# Patient Record
Sex: Female | Born: 1975 | Race: White | Hispanic: No | Marital: Married | Smoking: Former smoker
Health system: Southern US, Community
[De-identification: ages and names within clinical notes are randomized; demographics above are authoritative.]

## PROBLEM LIST (undated history)

## (undated) DIAGNOSIS — R569 Unspecified convulsions: Secondary | ICD-10-CM

## (undated) DIAGNOSIS — D069 Carcinoma in situ of cervix, unspecified: Secondary | ICD-10-CM

## (undated) DIAGNOSIS — N186 End stage renal disease: Secondary | ICD-10-CM

## (undated) DIAGNOSIS — S92353A Displaced fracture of fifth metatarsal bone, unspecified foot, initial encounter for closed fracture: Secondary | ICD-10-CM

## (undated) DIAGNOSIS — G629 Polyneuropathy, unspecified: Secondary | ICD-10-CM

## (undated) DIAGNOSIS — I509 Heart failure, unspecified: Secondary | ICD-10-CM

## (undated) DIAGNOSIS — I10 Essential (primary) hypertension: Secondary | ICD-10-CM

## (undated) DIAGNOSIS — D649 Anemia, unspecified: Secondary | ICD-10-CM

## (undated) DIAGNOSIS — E785 Hyperlipidemia, unspecified: Secondary | ICD-10-CM

## (undated) DIAGNOSIS — K3184 Gastroparesis: Secondary | ICD-10-CM

## (undated) DIAGNOSIS — R011 Cardiac murmur, unspecified: Secondary | ICD-10-CM

## (undated) DIAGNOSIS — N289 Disorder of kidney and ureter, unspecified: Secondary | ICD-10-CM

## (undated) DIAGNOSIS — E11610 Type 2 diabetes mellitus with diabetic neuropathic arthropathy: Secondary | ICD-10-CM

## (undated) DIAGNOSIS — I2510 Atherosclerotic heart disease of native coronary artery without angina pectoris: Secondary | ICD-10-CM

## (undated) DIAGNOSIS — Z992 Dependence on renal dialysis: Secondary | ICD-10-CM

## (undated) DIAGNOSIS — I739 Peripheral vascular disease, unspecified: Secondary | ICD-10-CM

## (undated) DIAGNOSIS — Z872 Personal history of diseases of the skin and subcutaneous tissue: Secondary | ICD-10-CM

## (undated) DIAGNOSIS — G43909 Migraine, unspecified, not intractable, without status migrainosus: Secondary | ICD-10-CM

## (undated) DIAGNOSIS — M1990 Unspecified osteoarthritis, unspecified site: Secondary | ICD-10-CM

## (undated) DIAGNOSIS — E119 Type 2 diabetes mellitus without complications: Secondary | ICD-10-CM

## (undated) DIAGNOSIS — F17200 Nicotine dependence, unspecified, uncomplicated: Secondary | ICD-10-CM

## (undated) DIAGNOSIS — S82853A Displaced trimalleolar fracture of unspecified lower leg, initial encounter for closed fracture: Secondary | ICD-10-CM

## (undated) HISTORY — DX: Hyperlipidemia, unspecified: E78.5

## (undated) HISTORY — DX: Nicotine dependence, unspecified, uncomplicated: F17.200

## (undated) HISTORY — PX: CERVICAL BIOPSY  W/ LOOP ELECTRODE EXCISION: SUR135

## (undated) HISTORY — DX: Essential (primary) hypertension: I10

## (undated) HISTORY — DX: Gastroparesis: K31.84

## (undated) HISTORY — DX: Type 2 diabetes mellitus with diabetic neuropathic arthropathy: E11.610

## (undated) HISTORY — PX: CERVICAL CONE BIOPSY: SUR198

## (undated) HISTORY — PX: UMBILICAL HERNIA REPAIR: SHX196

## (undated) HISTORY — PX: INCISION AND DRAINAGE ABSCESS: SHX5864

## (undated) HISTORY — PX: COLPOSCOPY: SHX161

## (undated) HISTORY — DX: Atherosclerotic heart disease of native coronary artery without angina pectoris: I25.10

## (undated) HISTORY — DX: Type 2 diabetes mellitus without complications: E11.9

## (undated) HISTORY — PX: CATARACT EXTRACTION W/ INTRAOCULAR LENS  IMPLANT, BILATERAL: SHX1307

## (undated) HISTORY — DX: Anemia, unspecified: D64.9

## (undated) HISTORY — DX: End stage renal disease: N18.6

## (undated) HISTORY — PX: HERNIA REPAIR: SHX51

## (undated) HISTORY — DX: Personal history of diseases of the skin and subcutaneous tissue: Z87.2

## (undated) HISTORY — DX: Peripheral vascular disease, unspecified: I73.9

## (undated) HISTORY — PX: FRACTURE SURGERY: SHX138

## (undated) HISTORY — DX: Carcinoma in situ of cervix, unspecified: D06.9

## (undated) HISTORY — DX: Dependence on renal dialysis: Z99.2

---

## 1999-07-04 ENCOUNTER — Other Ambulatory Visit: Admission: RE | Admit: 1999-07-04 | Discharge: 1999-07-04 | Payer: Self-pay | Admitting: Obstetrics and Gynecology

## 1999-07-04 ENCOUNTER — Encounter (INDEPENDENT_AMBULATORY_CARE_PROVIDER_SITE_OTHER): Payer: Self-pay

## 2000-02-21 ENCOUNTER — Encounter (INDEPENDENT_AMBULATORY_CARE_PROVIDER_SITE_OTHER): Payer: Self-pay | Admitting: Specialist

## 2000-02-21 ENCOUNTER — Other Ambulatory Visit: Admission: RE | Admit: 2000-02-21 | Discharge: 2000-02-21 | Payer: Self-pay | Admitting: Obstetrics and Gynecology

## 2000-04-22 ENCOUNTER — Encounter: Payer: Self-pay | Admitting: Obstetrics and Gynecology

## 2000-04-28 ENCOUNTER — Encounter (INDEPENDENT_AMBULATORY_CARE_PROVIDER_SITE_OTHER): Payer: Self-pay | Admitting: Specialist

## 2000-04-28 ENCOUNTER — Ambulatory Visit (HOSPITAL_COMMUNITY): Admission: RE | Admit: 2000-04-28 | Discharge: 2000-04-28 | Payer: Self-pay | Admitting: Obstetrics and Gynecology

## 2000-11-21 ENCOUNTER — Other Ambulatory Visit: Admission: RE | Admit: 2000-11-21 | Discharge: 2000-11-21 | Payer: Self-pay | Admitting: Obstetrics and Gynecology

## 2001-08-20 ENCOUNTER — Other Ambulatory Visit: Admission: RE | Admit: 2001-08-20 | Discharge: 2001-08-20 | Payer: Self-pay | Admitting: Obstetrics and Gynecology

## 2002-08-27 ENCOUNTER — Other Ambulatory Visit: Admission: RE | Admit: 2002-08-27 | Discharge: 2002-08-27 | Payer: Self-pay | Admitting: Obstetrics and Gynecology

## 2003-09-02 ENCOUNTER — Other Ambulatory Visit: Admission: RE | Admit: 2003-09-02 | Discharge: 2003-09-02 | Payer: Self-pay | Admitting: Obstetrics and Gynecology

## 2004-10-11 ENCOUNTER — Encounter: Admission: RE | Admit: 2004-10-11 | Discharge: 2004-10-11 | Payer: Self-pay | Admitting: Orthopedic Surgery

## 2005-10-25 ENCOUNTER — Other Ambulatory Visit: Admission: RE | Admit: 2005-10-25 | Discharge: 2005-10-25 | Payer: Self-pay | Admitting: Obstetrics and Gynecology

## 2006-10-30 ENCOUNTER — Other Ambulatory Visit: Admission: RE | Admit: 2006-10-30 | Discharge: 2006-10-30 | Payer: Self-pay | Admitting: Obstetrics and Gynecology

## 2007-05-28 HISTORY — PX: DILATION AND CURETTAGE OF UTERUS: SHX78

## 2007-07-20 ENCOUNTER — Other Ambulatory Visit: Admission: RE | Admit: 2007-07-20 | Discharge: 2007-07-20 | Payer: Self-pay | Admitting: Obstetrics and Gynecology

## 2007-08-27 ENCOUNTER — Encounter: Payer: Self-pay | Admitting: Obstetrics & Gynecology

## 2007-08-27 ENCOUNTER — Ambulatory Visit (HOSPITAL_COMMUNITY): Admission: RE | Admit: 2007-08-27 | Discharge: 2007-08-27 | Payer: Self-pay | Admitting: Obstetrics & Gynecology

## 2007-10-01 ENCOUNTER — Ambulatory Visit: Payer: Self-pay | Admitting: Cardiology

## 2009-09-20 ENCOUNTER — Encounter: Payer: Self-pay | Admitting: Cardiology

## 2009-10-11 ENCOUNTER — Encounter: Payer: Self-pay | Admitting: Cardiology

## 2009-10-12 ENCOUNTER — Ambulatory Visit: Payer: Self-pay | Admitting: Cardiology

## 2009-10-12 ENCOUNTER — Encounter: Payer: Self-pay | Admitting: Cardiology

## 2009-10-15 ENCOUNTER — Encounter: Payer: Self-pay | Admitting: Cardiology

## 2009-10-24 ENCOUNTER — Encounter: Payer: Self-pay | Admitting: Cardiology

## 2009-10-25 ENCOUNTER — Ambulatory Visit: Payer: Self-pay | Admitting: Cardiology

## 2009-10-25 ENCOUNTER — Encounter: Payer: Self-pay | Admitting: Physician Assistant

## 2009-10-26 ENCOUNTER — Encounter: Payer: Self-pay | Admitting: Cardiology

## 2009-12-20 ENCOUNTER — Ambulatory Visit: Payer: Self-pay | Admitting: Cardiology

## 2009-12-20 DIAGNOSIS — R0602 Shortness of breath: Secondary | ICD-10-CM

## 2009-12-20 DIAGNOSIS — N049 Nephrotic syndrome with unspecified morphologic changes: Secondary | ICD-10-CM | POA: Insufficient documentation

## 2009-12-20 DIAGNOSIS — I2510 Atherosclerotic heart disease of native coronary artery without angina pectoris: Secondary | ICD-10-CM | POA: Insufficient documentation

## 2009-12-20 DIAGNOSIS — D509 Iron deficiency anemia, unspecified: Secondary | ICD-10-CM

## 2009-12-20 DIAGNOSIS — E785 Hyperlipidemia, unspecified: Secondary | ICD-10-CM

## 2009-12-20 DIAGNOSIS — R079 Chest pain, unspecified: Secondary | ICD-10-CM

## 2009-12-20 DIAGNOSIS — E7800 Pure hypercholesterolemia, unspecified: Secondary | ICD-10-CM

## 2009-12-20 DIAGNOSIS — I10 Essential (primary) hypertension: Secondary | ICD-10-CM | POA: Insufficient documentation

## 2009-12-20 DIAGNOSIS — R072 Precordial pain: Secondary | ICD-10-CM

## 2009-12-20 DIAGNOSIS — F17200 Nicotine dependence, unspecified, uncomplicated: Secondary | ICD-10-CM

## 2009-12-20 DIAGNOSIS — D649 Anemia, unspecified: Secondary | ICD-10-CM | POA: Insufficient documentation

## 2009-12-25 ENCOUNTER — Encounter (INDEPENDENT_AMBULATORY_CARE_PROVIDER_SITE_OTHER): Payer: Self-pay | Admitting: *Deleted

## 2009-12-27 ENCOUNTER — Telehealth (INDEPENDENT_AMBULATORY_CARE_PROVIDER_SITE_OTHER): Payer: Self-pay | Admitting: *Deleted

## 2010-01-09 ENCOUNTER — Ambulatory Visit: Payer: Self-pay | Admitting: Vascular Surgery

## 2010-01-27 ENCOUNTER — Encounter: Payer: Self-pay | Admitting: Cardiology

## 2010-04-09 ENCOUNTER — Encounter: Payer: Self-pay | Admitting: Cardiology

## 2010-04-16 ENCOUNTER — Encounter: Payer: Self-pay | Admitting: Cardiology

## 2010-04-23 ENCOUNTER — Encounter: Payer: Self-pay | Admitting: Cardiology

## 2010-04-26 HISTORY — PX: AV FISTULA PLACEMENT: SHX1204

## 2010-04-30 ENCOUNTER — Encounter: Payer: Self-pay | Admitting: Cardiology

## 2010-05-03 ENCOUNTER — Ambulatory Visit: Payer: Self-pay | Admitting: Physician Assistant

## 2010-05-03 DIAGNOSIS — E109 Type 1 diabetes mellitus without complications: Secondary | ICD-10-CM | POA: Insufficient documentation

## 2010-06-09 ENCOUNTER — Encounter: Payer: Self-pay | Admitting: Cardiology

## 2010-06-11 ENCOUNTER — Encounter: Payer: Self-pay | Admitting: Cardiology

## 2010-06-18 ENCOUNTER — Encounter: Payer: Self-pay | Admitting: Cardiology

## 2010-06-19 ENCOUNTER — Encounter: Payer: Self-pay | Admitting: Cardiology

## 2010-06-25 ENCOUNTER — Encounter: Payer: Self-pay | Admitting: Cardiology

## 2010-06-28 NOTE — Assessment & Plan Note (Signed)
Summary: 3 MO FU   Visit Type:  Follow-up Primary Provider:  Dhruv Vyas,MD   History of Present Illness: patient returns for scheduled followup.  She underwent recent surgery for placement of an AV graft in her left wrist, on December 1, at [facility]. She is being closely followed by Dr. Kristian Covey, for nephrotic syndrome.  When last seen, Dr. Andee Lineman ordered an iron profile, notable for low iron 44, low transferrin 120, low TIBC 168; normal percent saturation 26, normal ferritin 58, and normal reticulocytes 1.45. Recent labs, per Dr. Kristian Covey, notable for BUN 70, creatinine 3.9, hemoglobin 8.7, and hematocrit 27. She receives periodic RBC transfusions, erythropoietin, and is on supplemental iron.  Clinically, she reports decreased frequency of chest pain, since last seen here. Several medications were adjusted at that time, for more aggressive BP control. She typically has chest pain approximately twice a week, unpredictable in onset. She denies any frank exertional chest pain, or significant DOE.   Preventive Screening-Counseling & Management  Alcohol-Tobacco     Smoking Status: quit     Year Quit: 2010  Current Medications (verified): 1)  Catapres-Tts-3 0.3 Mg/24hr Ptwk (Clonidine Hcl) .... Apply One Patch Weekly 2)  Coreg 25 Mg Tabs (Carvedilol) .... Take 1 Tablet By Mouth Two Times A Day 3)  Lisinopril 20 Mg Tabs (Lisinopril) .... Take 1 Tablet By Mouth Two Times A Day 4)  Gabapentin 400 Mg Caps (Gabapentin) .... Take 1 Tablet By Mouth Three Times A Day 5)  Plavix 75 Mg Tabs (Clopidogrel Bisulfate) .... Take 1 Tablet By Mouth Once A Day 6)  Amlodipine Besylate 10 Mg Tabs (Amlodipine Besylate) .... Take 1 Tablet By Mouth Once A Day 7)  Simvastatin 40 Mg Tabs (Simvastatin) .... Take 1 Tablet By Mouth Once A Day 8)  Imdur 60 Mg Xr24h-Tab (Isosorbide Mononitrate) .... Take 1 Tablet By Mouth Once A Day 9)  Furosemide 20 Mg Tabs (Furosemide) .... Take 1 Tablet By Mouth Three Times  A Day 10)  Ferrous Sulfate 325 (65 Fe) Mg Tabs (Ferrous Sulfate) .... Take 1 Tablet By Mouth Two Times A Day 11)  Humulin 70/30 70-30 % Susp (Insulin Isophane & Regular) .... Use As Directed 12)  Nitrostat 0.4 Mg Subl (Nitroglycerin) .... Dissolve One Tablet Under Tongue For Severe Chest Pain As Needed Every 5 Minutes, Not To Exceed 3 in 15 Min Time Frame 13)  Renvela 800 Mg Tabs (Sevelamer Carbonate) .... Take 3 Tablet By Mouth Three Times A Day With Meals  Allergies (verified): 1)  ! Keflex 2)  ! Septra 3)  ! Asa  Comments:  Nurse/Medical Assistant: The patient's medications and allergies were verbally reviewed with the patient and were updated in the Medication and Allergy Lists.  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 mild, chronic LE edema. All other systems negative.   Vital Signs:  Patient profile:   35 year old female Height:      76 inches Weight:      222 pounds Pulse rate:   74 / minute BP sitting:   166 / 94  (left arm) Cuff size:   large  Vitals Entered By: Carlye Grippe (May 03, 2010 1:05 PM)  Physical Exam  Additional Exam:  GEN: 35 year old female,  no distress HEENT: NCAT,PERRLA,EOMI NECK: palpable pulses, no bruits; no JVD; no TM LUNGS: CTA bilaterally HEART: RRR (S1S2); soft, grade 2/6 systolic murmur; no rubs; no gallops ABD: soft, NT; intact BS EXT: 1+ bilateral, nonpitting edema SKIN: warm, dry MUSC: no obvious deformity NEURO: A/O (x3)     Impression & Recommendations:  Problem # 1:  CHEST PAIN UNSPECIFIED (ICD-786.50)  patient reports clinical improvement, since last OV. Her symptoms are unpredictable in onset, and short lasting.  She has not had to use any p.r.n. nitroglycerin. She has presumed CAD, based on previous abnormal Cardiolite. She is being aggressively treated conservatively, secondary to her underlying kidney disease, placing her at very high risk for CIN. Will increase Imdur to 90 mg daily, for more aggressive suppression of occasional CP. Will schedule return clinic visit with Dr. Andee Lineman in 3 months.  Problem # 2:  ESSENTIAL HYPERTENSION, BENIGN (ICD-401.1)  improved, following previous multiple medication adjustments, per Dr. Andee Lineman.  Problem # 3:  UNSPECIFIED ANEMIA (ICD-285.9)  followed by Dr. Kristian Covey  Problem # 4:  NEPHROTIC SYNDROME (ICD-581.9)  followed by Dr. 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 You will receive a reminder letter in the mail one-two months in advance. If you don't receive a letter, please call our office to schedule the follow-up appointment. 2)  Increase Imdur (isosorbide) to 90mg  by mouth once daily. This will be 1 1/2 of your 60mg  tablets. Prescriptions: IMDUR 60 MG XR24H-TAB (ISOSORBIDE MONONITRATE) Take 1 1/2 tablet by mouth once a day  #45 x 6   Entered by:   Cyril Loosen, RN, BSN   Authorized by:   Nelida Meuse, PA-C   Signed by:   Cyril Loosen, RN, BSN on 05/03/2010   Method used:   Electronically to        [facility]* (retail)       [address]       [place], [place]  [address]       Ph: [phone]       Fax: [phone]   RxID:   [id number]  I have reviewed and approved all prescriptions at the time of the office visit. Nelida Meuse, PA-C  May 03, 2010 1:43 PM

## 2010-06-28 NOTE — Assessment & Plan Note (Signed)
Summary: [facility]POST [facility] IN MAY & JUNE   Visit Type:  hospital follow-up Primary Provider:  Dhruv Vyas,MD   History of Present Illness: the patient is a 35 year old female with a history of insulin-dependent diabetes mellitus, difficult to control hypertension, nephrotic syndrome and renal insufficiency with a creatinine of 2.7. The patient has normal LV function. However she had previous and abnormal Cardiolite scan that was felt we should treat her medically given her high risk for contrast-induced nephropathy. She was also anemic at that time and required a blood transfusion. Her blood pressure was uncontrolled.  The patient presents for followup. She continues to complain of left-sided chest pain with radiation to the left arm upon exertion. Her pain is intermittent typically only last a few minutes. She has not tried to use any nitroglycerin. There some associated shortness of breath but no diaphoresis. This  Patient also complains of increased generalized weakness. She thinks she may be more anemic. Apparently there have been some discussions about intravenous iron therapy. However there is no history of blood loss. The patient also currently is not on erythropoietin therapy.  Preventive Screening-Counseling & Management  Alcohol-Tobacco     Smoking Status: quit     Year Quit: 2010  Current Medications (verified): 1)  Catapres-Tts-3 0.3 Mg/24hr Ptwk (Clonidine Hcl) .... Apply One Patch Weekly 2)  Coreg 25 Mg Tabs (Carvedilol) .... Take 1 Tablet By Mouth Two Times A Day 3)  Lisinopril 20 Mg Tabs (Lisinopril) .... Take 1 Tablet By Mouth Two Times A Day 4)  Valturna 300-320 Mg Tabs (Aliskiren-Valsartan) .... Take 1 Tablet By Mouth Once A Day 5)  Gabapentin 100 Mg Caps (Gabapentin) .... Take 1 Tablet By Mouth Three Times A Day 6)  Plavix 75 Mg Tabs (Clopidogrel Bisulfate) .... Take 1 Tablet By Mouth Once A Day 7)  Amlodipine Besylate 10 Mg Tabs (Amlodipine Besylate) .... Take 1 Tablet By  Mouth Once A Day 8)  Simvastatin 40 Mg Tabs (Simvastatin) .... Take 1 Tablet By Mouth Once A Day 9)  Imdur 60 Mg Xr24h-Tab (Isosorbide Mononitrate) .... Take 1 Tablet By Mouth Once A Day 10)  Furosemide 20 Mg Tabs (Furosemide) .... Take 1 Tablet By Mouth Three Times A Day 11)  Ferrous Sulfate 325 (65 Fe) Mg Tabs (Ferrous Sulfate) .... Take 1 Tablet By Mouth Two Times A Day 12)  Humulin 70/30 70-30 % Susp (Insulin Isophane & Regular) .... Use As Directed 13)  Nitrostat 0.4 Mg Subl (Nitroglycerin) .... Dissolve One Tablet Under Tongue For Severe Chest Pain As Needed Every 5 Minutes, Not To Exceed 3 in 15 Min Time Frame  Allergies (verified): 1)  ! Keflex 2)  ! Septra 3)  ! Asa  Comments:  Nurse/Medical Assistant: The patient's medication list and allergies were reviewed with the patient and were updated in the Medication and Allergy Lists.  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  The patient denies malaise, fever, weight gain/loss, vision loss, decreased hearing, hoarseness, palpitations, prolonged cough, wheezing, sleep apnea, coughing up blood, abdominal pain, blood in stool, nausea, vomiting, diarrhea, heartburn, incontinence, blood in urine, muscle weakness, joint pain, leg swelling,  rash, skin lesions, headache, fainting, dizziness, depression, anxiety, enlarged lymph nodes, easy bruising or bleeding, and environmental allergies.    Vital Signs:  Patient profile:   35 year old female Height:      76 inches Weight:      201 pounds BMI:     24.55 Pulse rate:   99 / minute BP sitting:   174 / 105  (left arm) Cuff size:   large  Vitals Entered By: Carlye Grippe (December 20, 2009 10:09 AM)  Nutrition Counseling: Patient's BMI is greater than 25 and therefore counseled on weight management options.  Physical Exam  Additional Exam:  General: Well-developed, well-nourished in no distress, pale appearing. head: Normocephalic and atraumatic eyes PERRLA/EOMI intact, conjunctiva and lids normal nose: No deformity or lesions mouth normal dentition, normal posterior pharynx neck: Supple, no JVD.  No masses, thyromegaly or abnormal cervical nodes lungs: Normal breath sounds bilaterally without wheezing.  Normal percussion heart: regular rate and rhythm with normal S1 and S2, no S3 or S4.  PMI is normal.  No pathological murmurs abdomen: Normal bowel sounds, abdomen is soft and nontender without masses, organomegaly or hernias noted.  No hepatosplenomegaly musculoskeletal: Back normal, normal gait muscle strength and tone normal pulsus: Pulse is normal in all 4 extremities Extremities: No peripheral pitting edema neurologic: Alert and oriented x 3 skin: Intact without lesions or rashes cervical nodes: No significant adenopathy psychologic: Normal affect    Impression & Recommendations:  Problem # 1:  PRECORDIAL PAIN (ICD-786.51) the patient has an abnormal Cardiolite scan and her chest is concerning for angina. However it are multiple contributing factors including uncontrolled blood pressure as well as likely significant recurrent anemia. The patient would be at very high risk for contrast-induced nephropathy requiring dialysis given her severe renal insufficiency. At  this point I would try to continue to treat her medically. We will check a CBC and see if she needs a transfusion. Will also improve her blood pressure control. I also feel the patient should be a candidate for subcutaneous erythropoietin 50-100 units subcutaneous 3 times a week with a goal hemoglobin of 10. Her updated medication list for this problem includes:    Coreg 25 Mg Tabs (Carvedilol) .Marland Kitchen... Take 1 tablet by mouth two times a day    Lisinopril 20 Mg Tabs (Lisinopril) .Marland Kitchen... Take 1 tablet by mouth two times a day    Plavix 75 Mg Tabs (Clopidogrel bisulfate) .Marland Kitchen... Take 1 tablet by mouth once a day    Amlodipine Besylate 10 Mg Tabs (Amlodipine besylate) .Marland Kitchen... Take 1 tablet by mouth once a day    Imdur 60 Mg Xr24h-tab (Isosorbide mononitrate) .Marland Kitchen... Take 1 tablet by mouth once a day    Nitrostat 0.4 Mg Subl (Nitroglycerin) .Marland Kitchen... Dissolve one tablet under tongue for severe chest pain as needed every 5 minutes, not to exceed 3 in 15 min time frame  Orders: T-Iron ([id number]) T-Ferritin ([id number]) T-Basic Metabolic Panel ([id number]) T-CBC No Diff ([id number]) T-Reticulocyte Count, Manual ([id number]) T- * Misc. Laboratory test [id number])  Problem # 2:  NEPHROTIC SYNDROME (ICD-581.9) Assessment: Comment Only  Orders: T-Iron ([id number]) T-Ferritin [id number]) T-Basic Metabolic Panel [id number]) T-CBC No Diff ([id number]) T-Reticulocyte Count, Manual (73710) T- * Misc. Laboratory  test [phone])  Problem # 3:  UNSPECIFIED ANEMIA (ICD-285.9) we will order iron studies including ferritin, iron saturation and iron as well as reticulocyte count and CBC. The patient may need transfusion and or supplementation with erythropoietin. If she has iron deficiency she could benefit from intravenous iron Orders: T-Iron [id number]) T-Ferritin [id number]) T-Basic Metabolic Panel [phone] [id number]) T-CBC No Diff ([id number]) T-Reticulocyte Count, Manual (24401) T- * Misc. Laboratory test  ([id number])  Problem # 4:  ESSENTIAL HYPERTENSION, BENIGN (ICD-401.1) blood pressure is poorly controlled. We will increase clonidine to 0.3 mg a week. Amlodipine will be increased to 10 mg a day and isosorbide to 60 mg a day. She will also be given p.r.n. nitroglycerin. However I also think that her hypertension is volume dependent and she may require higher doses of Lasix. Her updated medication list for this problem includes:    Catapres-tts-3 0.3 Mg/24hr Ptwk (Clonidine hcl) .Marland Kitchen... Apply one patch weekly    Coreg 25 Mg Tabs (Carvedilol) .Marland Kitchen... Take 1 tablet by mouth two times a day    Lisinopril 20 Mg Tabs (Lisinopril) .Marland Kitchen... Take 1 tablet by mouth two times a day    Valturna 300-320 Mg Tabs (Aliskiren-valsartan) .Marland Kitchen... Take 1 tablet by mouth once a day    Amlodipine Besylate 10 Mg Tabs (Amlodipine besylate) .Marland Kitchen... Take 1 tablet by mouth once a day    Furosemide 20 Mg Tabs (Furosemide) .Marland Kitchen... Take 1 tablet by mouth three times a day  Patient Instructions: 1)  Increase Clonidine patch to 0.3mg  / week  2)  Increase Amlodipine to 10mg  daily  3)  Increase Imdur to 60mg  daily 4)  Nitroglycerin as needed for severe chest pain  5)  Labs 6)  Follow up in  3 months Prescriptions: PLAVIX 75 MG TABS (CLOPIDOGREL BISULFATE) Take 1 tablet by mouth once a day  #30 x 6   Entered by:   Hoover Brunette, LPN   Authorized by:   Lewayne Bunting, MD, [facility]   Signed by:   Hoover Brunette, LPN on 36/64/4034   Method used:   Electronically to        [facility]* (retail)       [address]       [place], [place]  [address]       Ph: [phone]       Fax: [phone]   RxID:   [id number] LISINOPRIL 20 MG TABS (LISINOPRIL) Take 1 tablet by mouth two times a day  #60 x 6   Entered by:   Hoover Brunette, LPN   Authorized by:   Lewayne Bunting, MD, [facility]   Signed by:   Hoover Brunette, LPN on 32/20/2542   Method used:   Electronically to        [facility]* (retail)       [address]        [place], [place]  [address]       Ph: [phone]       Fax: [phone]   RxID:   [id number] COREG 25 MG TABS (CARVEDILOL) Take 1 tablet by mouth two times a day  #60 x 6   Entered by:   Hoover Brunette, LPN   Authorized by:   Lewayne Bunting, MD, [facility]   Signed by:   Hoover Brunette, LPN on 81/82/9937   Method used:   Electronically to        [facility]* (retail)       [address]       [place], [place]  [address]       Ph: [phone]       Fax: [phone]   RxID:   [id number] NITROSTAT 0.4 MG SUBL (NITROGLYCERIN) dissolve one tablet under tongue for severe chest pain as needed every 5 minutes, not to exceed 3 in 15 min time frame  #25 x 3   Entered by:   Hoover Brunette, LPN   Authorized by:   Lewayne Bunting, MD, [facility]   Signed by:   Hoover Brunette, LPN on 40/02/2724   Method used:   Electronically to        [facility]* (retail)       [address]       [place], [place]  [address]       Ph: [phone]       Fax: [phone]   RxID:   [id number] IMDUR 60 MG XR24H-TAB (ISOSORBIDE MONONITRATE) Take 1 tablet by mouth once a day  #30 x 6   Entered by:   Hoover Brunette, LPN   Authorized by:   Lewayne Bunting, MD, [facility]   Signed by:   Hoover Brunette, LPN on 09/32/3557   Method used:   Electronically to        [facility]* (retail)       [address]       [place], [place]  [address]       Ph: [phone]       Fax: [phone]   RxID:   [id number] AMLODIPINE BESYLATE 10 MG TABS (AMLODIPINE BESYLATE) Take 1 tablet by mouth once a day  #30 x 6   Entered by:   Hoover Brunette, LPN   Authorized by:   Lewayne Bunting, MD, [facility]   Signed by:   Hoover Brunette, LPN on 70/35/0093   Method used:   Electronically to        [facility]* (retail)       [address]       [place], [place]  [address]       Ph: [phone]       Fax: [phone]   RxID:    [id number] CATAPRES-TTS-3 0.3 MG/24HR PTWK (CLONIDINE HCL) apply one patch weekly  #4 x 6   Entered by:   Hoover Brunette, LPN   Authorized by:   Lewayne Bunting, MD, [facility]   Signed by:   Hoover Brunette, LPN on 43/15/4008   Method used:   Electronically to        [facility]* (retail)       [address]       [place], [place]  [address]       Ph: [phone]       Fax: [phone]   RxID:   [id number]

## 2010-06-28 NOTE — Consult Note (Signed)
Summary: CARDIOLOGY CONSULT/ [facility]  CARDIOLOGY CONSULT/ [facility]   Imported By: Zachary George 12/20/2009 08:49:46  _____________________________________________________________________  External Attachment:    Type:   Image     Comment:   External Document

## 2010-06-28 NOTE — Progress Notes (Signed)
Summary: LAB WORK RESULTS  Phone Note Call from Patient Call back at Home Phone [phone] Call back at [phone]   Caller: Patient Reason for Call: Lab or Test Results Details for Reason: WANTING RESULTS Summary of Call: PATIENT REALLY WOULD LIKE TO KNOW HER LAB WORK RESULTS Initial call taken by: Claudette Laws,  December 27, 2009 2:08 PM  Follow-up for Phone Call        Patient notified via voice mail. Hoover Brunette, LPN  December 28, 2009 4:21 PM

## 2010-06-28 NOTE — Letter (Signed)
Summary: Engineer, materials at [facility]  [address]   [place], [place] [address]   Phone: [phone]  Fax: [phone]        December 25, 2009 MRN: [id number]   [facility] [address] [place], [place]  [address]   Dear Ms. Stan,  Your test ordered by Selena Batten has been reviewed by your physician (or physician assistant) and was found to be normal or stable. Your physician (or physician assistant) felt no changes were needed at this time.  ____ Echocardiogram  ____ Cardiac Stress Test  __X__ Lab Work - anemia stable  ____ Peripheral vascular study of arms, legs or neck  ____ CT scan or X-ray  ____ Lung or Breathing test  ____ Other:   Thank you.   Hoover Brunette, LPN    Duane Boston, M.D., F.A.C.C. Thressa Sheller, M.D., F.A.C.C. Oneal Grout, M.D., F.A.C.C. Cheree Ditto, M.D., F.A.C.C. Daiva Nakayama, M.D., F.A.C.C. Kenney Houseman, M.D., F.A.C.C. Jeanne Ivan, PA-C

## 2010-06-28 NOTE — Consult Note (Signed)
Summary: CARDIOLOGY CONSULT/ [facility]  CARDIOLOGY CONSULT/ [facility]   Imported By: Zachary George 10/27/2009 15:54:01  _____________________________________________________________________  External Attachment:    Type:   Image     Comment:   External Document

## 2010-06-28 NOTE — Letter (Signed)
Summary: [facility] D/C DR. DHRUV VYAS  [facility] D/C DR. DHRUV VYAS   Imported By: Zachary George 12/20/2009 08:45:43  _____________________________________________________________________  External Attachment:    Type:   Image     Comment:   External Document

## 2010-06-28 NOTE — Letter (Signed)
Summary: [facility] D/C DR. DHRUV VYAS  [facility] D/C DR. DHRUV VYAS   Imported By: Zachary George 12/20/2009 08:48:23  _____________________________________________________________________  External Attachment:    Type:   Image     Comment:   External Document

## 2010-07-09 ENCOUNTER — Encounter: Payer: Self-pay | Admitting: Cardiology

## 2010-07-11 ENCOUNTER — Encounter: Payer: Self-pay | Admitting: Cardiology

## 2010-07-11 ENCOUNTER — Ambulatory Visit (INDEPENDENT_AMBULATORY_CARE_PROVIDER_SITE_OTHER): Payer: 59 | Admitting: Cardiology

## 2010-07-11 DIAGNOSIS — R9430 Abnormal result of cardiovascular function study, unspecified: Secondary | ICD-10-CM

## 2010-07-15 DIAGNOSIS — R0602 Shortness of breath: Secondary | ICD-10-CM

## 2010-07-16 ENCOUNTER — Ambulatory Visit (INDEPENDENT_AMBULATORY_CARE_PROVIDER_SITE_OTHER): Payer: Self-pay | Admitting: Internal Medicine

## 2010-07-16 DIAGNOSIS — I059 Rheumatic mitral valve disease, unspecified: Secondary | ICD-10-CM

## 2010-07-18 NOTE — Assessment & Plan Note (Signed)
Summary: 3 month followup/rj   Visit Type:  Follow-up Primary Provider:  Dhruv Vyas,MD   History of Present Illness: the patient is a 35 year old female with a history of insulin-dependent diabetes mellitus, difficult to control hypertension, nephrotic syndrome and renal insufficiency with a creatinine now of 3.9.  Previously she had an abnormal myocardial perfusion study but given her renal insufficiency we felt we should treat her medically because of her high risk for contrast-induced nephropathy.  At that time she was also anemic and required a blood transfusion.  More recently the patient has an AV graft placed.this appears to be functioning well.  The patient states that last Friday for a single episode of central chest pain which lasted about an hour associated with left arm pain.  There was some associated shortness of breath.  She's had a couple of episodes of chest pain over the last 3 weeks.  Unfortunately her blood pressure is running quite high and she ran out of her clonidine patch .3 mg.  She is also complaining of a dry mouth after increasing her gabapentin.  Fortunately she denies any exercise-induced angina.  She denies any orthopnea PND palpitations or syncope.  Preventive Screening-Counseling & Management  Alcohol-Tobacco     Smoking Status: quit     Year Quit: 2010  Current Medications (verified): 1)  Catapres-Tts-3 0.3 Mg/24hr Ptwk (Clonidine Hcl) .... Apply One Patch Weekly 2)  Coreg 25 Mg Tabs (Carvedilol) .... Take 1 Tablet By Mouth Two Times A Day 3)  Lisinopril 20 Mg Tabs (Lisinopril) .... Take 1 Tablet By Mouth Two Times A Day 4)  Gabapentin 600 Mg Tabs (Gabapentin) .... Take 1 Tablet By Mouth Three Times A Day 5)  Plavix 75 Mg Tabs (Clopidogrel Bisulfate) .... Take 1 Tablet By Mouth Once A Day 6)  Amlodipine Besylate 10 Mg Tabs (Amlodipine Besylate) .... Take 1 Tablet By Mouth Once A Day 7)  Simvastatin 40 Mg Tabs (Simvastatin) .... Take 1 Tablet By Mouth Once A  Day 8)  Imdur 120 Mg Xr24h-Tab (Isosorbide Mononitrate) .... Take 1 Tablet By Mouth Once A Day 9)  Furosemide 20 Mg Tabs (Furosemide) .... Take 1 Tablet By Mouth Three Times A Day 10)  Ferrous Sulfate 325 (65 Fe) Mg Tabs (Ferrous Sulfate) .... Take 1 Tablet By Mouth Two Times A Day 11)  Humulin 70/30 70-30 % Susp (Insulin Isophane & Regular) .... Use As Directed 12)  Nitrostat 0.4 Mg Subl (Nitroglycerin) .... Dissolve One Tablet Under Tongue For Severe Chest Pain As Needed Every 5 Minutes, Not To Exceed 3 in 15 Min Time Frame 13)  Renvela 800 Mg Tabs (Sevelamer Carbonate) .... Take 3 Tablet By Mouth Three Times A Day With Meals  Allergies (verified): 1)  ! Keflex 2)  ! Septra 3)  ! Asa  Comments:  Nurse/Medical Assistant: The patient's medications and allergies were reviewed with the patient and were updated in the Medication and Allergy Lists. pt did not bring list or  bottles to verify. 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  Review of Systems       The patient complains of chest pain and shortness of breath.  The patient denies fatigue, malaise, fever, weight gain/loss, vision loss, decreased hearing, hoarseness, palpitations, prolonged cough, wheezing, sleep apnea, coughing up blood, abdominal pain, blood in  stool, nausea, vomiting, diarrhea, heartburn, incontinence, blood in urine, muscle weakness, joint pain, leg swelling, rash, skin lesions, headache, fainting, dizziness, depression, anxiety, enlarged lymph nodes, easy bruising or bleeding, and environmental allergies.    Vital Signs:  Patient profile:   35 year old female Height:      76 inches Weight:      231 pounds BMI:     28.22 Pulse rate:   78 / minute BP sitting:   171 / 79  (right arm) Cuff size:   large  Vitals Entered By: Fuller Plan CMA (July 11, 2010 10:34 AM)  Physical Exam  Additional Exam:  General: Well-developed, well-nourished in no distress, pale-appearing head: Normocephalic and atraumatic eyes PERRLA/EOMI intact, conjunctiva and lids normal nose: No deformity or lesions mouth normal dentition, normal posterior pharynx neck: Supple, no JVD.  No masses, thyromegaly or abnormal cervical nodes lungs: Normal breath sounds bilaterally without wheezing.  Normal percussion heart: regular rate and rhythm with normal S1 and S2, no S3 or S4.  PMI is normal.  No pathological murmurs abdomen: Normal bowel sounds, abdomen is soft and nontender without masses, organomegaly or hernias noted.  No hepatosplenomegaly musculoskeletal: Back normal, normal gait muscle strength and tone normal pulsus: Pulse is normal in all 4 extremities Extremities: No peripheral pitting edema, well functioning left forearm AV fistula with thrill neurologic: Alert and oriented x 3 skin: Intact without lesions or rashes cervical nodes: No significant adenopathy psychologic: Normal affect    Impression & Recommendations:  Problem # 1:  IDDM (ICD-250.01) Assessment Comment Only significant neuropathy with increasing gabapentin recently Her updated medication list for this problem includes:    Lisinopril 20 Mg Tabs (Lisinopril) .Marland Kitchen... Take 1 tablet by mouth two times a day    Humulin 70/30 70-30 % Susp (Insulin isophane & regular) ..... Use  as directed  Problem # 2:  ESSENTIAL HYPERTENSION, BENIGN (ICD-401.1) blood pressure is poorly controlled but the patient ran out of her clonidine patch.  We gave her a dose of .1 mg of clonidine the office today she reports to me that her clonidine patches are being delivered tomorrow.  Asked her to follow-up on her blood pressure closely with her nephrologist. Her updated medication list for this problem includes:    Catapres-tts-3 0.3 Mg/24hr Ptwk (Clonidine hcl) .Marland Kitchen... Apply one patch weekly    Coreg 25 Mg Tabs (Carvedilol) .Marland Kitchen... Take 1 tablet by mouth two times a day    Lisinopril 20 Mg Tabs (Lisinopril) .Marland Kitchen... Take 1 tablet by mouth two times a day    Amlodipine Besylate 10 Mg Tabs (Amlodipine besylate) .Marland Kitchen... Take 1 tablet by mouth once a day    Furosemide 20 Mg Tabs (Furosemide) .Marland Kitchen... Take 1 tablet by mouth three times a day  Problem # 3:  NEPHROTIC SYNDROME (ICD-581.9) the patient is approaching end-stage renal disease.  She had an AV forearm graft placed.  This seems to be functioning properly.  Problem # 4:  CORONARY ATHEROSCLEROSIS NATIVE CORONARY ARTERY (ICD-414.01) the patient has presumed coronary artery disease.  She has some chest pain that is not entirely clear it is  truly represents angina.  We will increase his isosorbide mononitrate 120 mg a day.  The patient also will require likely a cardiac catheterization when she is approaching the need for dialysis.  I do think at this point we can continue to manage her medically. Her updated medication list for this problem includes:    Coreg 25 Mg Tabs (Carvedilol) .Marland Kitchen... Take 1 tablet by mouth two times a day    Lisinopril 20 Mg Tabs (Lisinopril) .Marland Kitchen... Take 1 tablet by mouth two times a day    Plavix 75 Mg Tabs (Clopidogrel bisulfate) .Marland Kitchen... Take 1 tablet by mouth once a day    Amlodipine Besylate 10 Mg Tabs (Amlodipine besylate) .Marland Kitchen... Take 1 tablet by mouth once a day    Imdur 120 Mg Xr24h-tab (Isosorbide mononitrate) .Marland Kitchen... Take 1  tablet by mouth once a day    Nitrostat 0.4 Mg Subl (Nitroglycerin) .Marland Kitchen... Dissolve one tablet under tongue for severe chest pain as needed every 5 minutes, not to exceed 3 in 15 min time frame  Patient Instructions: 1)  Increase Imdur to 120mg  daily 2)  Follow up in  3 months Prescriptions: IMDUR 120 MG XR24H-TAB (ISOSORBIDE MONONITRATE) Take 1 tablet by mouth once a day  #30 x 6   Entered by:   Hoover Brunette, LPN   Authorized by:   Lewayne Bunting, MD, [facility]   Signed by:   Hoover Brunette, LPN on 30/86/5784   Method used:   Electronically to        [facility]* (retail)       [address]       [place], [place]  [address]       Ph: [phone]       Fax: [phone]   RxID:   [id number]

## 2010-07-18 NOTE — Medication Information (Signed)
Summary: [facility] D/C MEDICATION SHEET  [facility] D/C MEDICATION SHEET   Imported By: Zachary George 07/11/2010 09:55:44  _____________________________________________________________________  External Attachment:    Type:   Image     Comment:   External Document

## 2010-08-25 ENCOUNTER — Other Ambulatory Visit: Payer: Self-pay | Admitting: Nephrology

## 2010-08-25 ENCOUNTER — Ambulatory Visit (HOSPITAL_COMMUNITY)
Admission: RE | Admit: 2010-08-25 | Discharge: 2010-08-25 | Disposition: A | Discharge status: Home / Self Care | Payer: 59 | Source: Ambulatory Visit | Attending: Nephrology | Admitting: Nephrology

## 2010-08-25 DIAGNOSIS — T82868A Thrombosis of vascular prosthetic devices, implants and grafts, initial encounter: Secondary | ICD-10-CM

## 2010-08-25 DIAGNOSIS — I120 Hypertensive chronic kidney disease with stage 5 chronic kidney disease or end stage renal disease: Secondary | ICD-10-CM | POA: Insufficient documentation

## 2010-08-25 DIAGNOSIS — F17200 Nicotine dependence, unspecified, uncomplicated: Secondary | ICD-10-CM | POA: Insufficient documentation

## 2010-08-25 DIAGNOSIS — E119 Type 2 diabetes mellitus without complications: Secondary | ICD-10-CM | POA: Insufficient documentation

## 2010-08-25 DIAGNOSIS — Z794 Long term (current) use of insulin: Secondary | ICD-10-CM | POA: Insufficient documentation

## 2010-08-25 DIAGNOSIS — N186 End stage renal disease: Secondary | ICD-10-CM | POA: Insufficient documentation

## 2010-08-25 DIAGNOSIS — I2510 Atherosclerotic heart disease of native coronary artery without angina pectoris: Secondary | ICD-10-CM | POA: Insufficient documentation

## 2010-08-25 LAB — POCT I-STAT 4, (NA,K, GLUC, HGB,HCT)
Glucose, Bld: 369 mg/dL — ABNORMAL HIGH (ref 70–99)
Hemoglobin: 10.2 g/dL — ABNORMAL LOW (ref 12.0–15.0)
Potassium: 3.9 mEq/L (ref 3.5–5.1)
Sodium: 136 mEq/L (ref 135–145)

## 2010-08-25 MED ORDER — IOHEXOL 300 MG/ML  SOLN: 50.0000 mL | Freq: Once | INTRAMUSCULAR | Status: AC | PRN

## 2010-08-27 MED ORDER — IOHEXOL 300 MG/ML  SOLN
100.0000 mL | Freq: Once | INTRAMUSCULAR | Status: AC | PRN
  Administered 2010-08-27: 48 mL via INTRAVENOUS

## 2010-09-11 ENCOUNTER — Telehealth: Payer: Self-pay | Admitting: *Deleted

## 2010-09-12 NOTE — Telephone Encounter (Signed)
According to the old notes, she appears to be on a Catapres patch 0.3 mg every 24 hours weekly. I would switch her to clonidine 0.1 mg by mouth twice a day but she will need to monitor her blood pressure closely. At least for the next couple of days.

## 2010-09-12 NOTE — Telephone Encounter (Signed)
Left message for patient to call office.  

## 2010-09-14 MED ORDER — CLONIDINE HCL 0.1 MG PO TABS: 0.1000 mg | ORAL_TABLET | Freq: Two times a day (BID) | ORAL | Status: DC

## 2010-09-14 NOTE — Telephone Encounter (Signed)
Patient informed of the below and uses [facility].

## 2010-10-09 NOTE — Consult Note (Signed)
NEW PATIENT CONSULTATION   JAKEYA, GHERARDI E  DOB:  Feb 04, 1976                                       01/09/2010  ZOXWR#:[id number]   The patient is a 35 year old female with juvenile diabetes, having been  on insulin for the past 21 years.  She was referred by Dr. Ulice Brilliant for  arterial insufficiency.  She describes her symptoms as an aching,  throbbing weakness in both legs which has been going on for several  years but have worsened.  The  left is slightly worse than the right.  It begins in the mid thigh and extends to the foot.  She denies any  numbness, history of nonhealing ulcers,  infection or cellulitis.  She  also had these symptoms during the night which she describes as a  cramping sensation.  She can only walk very short distances but the  symptoms are not completely relieved by rest.  She had a lower extremity  arterial Doppler study performed at an outlying  Hospital on September 20, 2009 which I have reviewed.  She has calcified tibial vessels  bilaterally which is consistent with diabetes mellitus with triphasic  flow in her feet.   CHRONIC MEDICAL PROBLEMS:  1. Type 1 diabetes mellitus.  2. Hypertension.  3. Hyperlipidemia.  4. Coronary artery disease.  5. Renal insufficiency.   PAST SURGICAL HISTORY:  None.   SOCIAL HISTORY:  She is married and has 1 child.  Smokes occasional  cigarette, has smoked for 15 years up to half a pack cigarettes per day.  She not use alcohol.   FAMILY HISTORY:  Positive for diabetes in both parents and siblings.  Positive for stroke in a grandmother.  Negative for coronary artery  disease.   REVIEW OF SYSTEMS:  Positive for discomfort in her legs with walking.  As noted in the physical exam she does have some anemia, dyspnea on  exertion,  occasional chest tightness.  She has a creatinine of 2.7 with  chronic renal insufficiency secondary to diabetes mellitus.  She also  has Charcot arthropathy of her feet.  All  other systems in the review of  systems are negative.   PHYSICAL EXAMINATION:  Blood pressure 196/104, heart rate is 83,  respirations 14.  General:  She is a well-developed, well-nourished  female in no apparent distress,  alert and oriented x3.  HEENT:  Exam is  normal for age.  EOMs intact.  Poor dentition.  Lungs:  Clear to  auscultation.  No rhonchi or wheezing.  Cardiovascular exam:  Regular  rhythm.  No murmurs.  Carotid pulses 3+,  no bruits.  Abdomen;  Soft,  nontender with no masses.  Musculoskeletal:  Exam is free of major  deformities.  Neurologic:  Exam is normal.  Skin:  Free of rashes.  Lower extremity exam:  Reveals 3+ femoral, popliteal,  2+ dorsalis pedis  pulse on the right, 3+ femoral popliteal and 1+ dorsalis pedis pulse on  the left.  There is no infection, ulceration, gangrene or cellulitis.   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  I do not think these are causing her symptomatology.  She has renal insufficiency and coronary artery disease.  No further  workup is indicated at this time as I do not think her symptomatology is  due to  her tibial occlusive disease.  There is no indication for any  further evaluation at this time.     Quita Skye Hart Rochester, M.D.  Electronically Signed   JDL/MEDQ  D:  01/09/2010  T:  01/09/2010  Job:  [id number]   cc:   Denny Peon. Ulice Brilliant, D.P.M.

## 2010-10-09 NOTE — Op Note (Signed)
NAME:  [name], [name]               ACCOUNT NO.:  [id number]   MEDICAL RECORD NO.:  [id number]          PATIENT TYPE:  AMB   LOCATION:  DAY                           FACILITY:  [facility]   PHYSICIAN:  Lazaro Arms, M.D.   DATE OF BIRTH:  1975-12-15   DATE OF PROCEDURE:  08/27/2007  DATE OF DISCHARGE:                               OPERATIVE REPORT   PREOPERATIVE DIAGNOSES:  1. Missed AB in the first trimester.  2. Class F diabetes.   POSTOPERATIVE DIAGNOSES:  1. Missed AB in the first trimester.  2. Class F diabetes.   PROCEDURE:  Cervical dilation with a suction and sharp uterine  curettage.   SURGEON:  Lazaro Arms, M.D.   ANESTHESIA:  General endotracheal.   FINDINGS:  The patient came to the office yesterday for a repeat  ultrasound.  She had a lagging fetal growth and ultrasound revealed a  nonviable shrimp pregnancy, no fetal cardiac activity, and looked like  it had not grown at all since her last ultrasound a couple of weeks ago.  She is a class F diabetic with 9600 mg of protein in 24 hours.  She is a  severe chronic hypertensive and also diabetic.  She is admitted for a D  and C.  She is A positive.   DESCRIPTION OF OPERATION:  The patient was taken to the operating room  and placed in the supine position where she underwent general  endotracheal anesthesia.  She was prepped and draped in the usual  sterile fashion.  Cervix was grasped.  It was dilated serially.  A 10  and an 8 suctioned curet were passed, and several passes were made.  A  large amount of tissue was returned.  She was anemic preop, so I gave  her some Methergine IM to make sure her uterus contracts down well.  She  was also given Cleocin preoperatively.  Still was good uterine cry in  all areas with general sharp curettage.  The patient tolerated the  procedure well.  She experienced 150 cc of blood loss and was taken to  the recovery room in good stable condition.  All counts were correct.      Lazaro Arms, M.D.  Electronically Signed     LHE/MEDQ  D:  08/27/2007  T:  08/27/2007  Job:  [id number]

## 2010-10-12 NOTE — Op Note (Signed)
[facility]  Patient:    [name], [name]                      MRN: [id number] Proc. Date: 04/28/00 Adm. Date:  [id number] Attending:  Sharon Mt                           Operative Report  PREOPERATIVE DIAGNOSES:  Carcinoma in situ of the cervix.  POSTOPERATIVE DIAGNOSES:  Carcinoma in situ of the cervix.  OPERATION:  Shark knife conization.  SURGEON:  Daniel L. Eda Paschal, M.D.  ANESTHESIA:  General.  INDICATIONS:  The patient is a 35 year old female who has had Pap smears in the office showing high-grade dysplasia.  She underwent colposcopic biopsies followed by LEEP for both diagnosis and treatment of the above.  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  In addition, there was some glandular atypia which made the pathologist at least somewhat suspicious that she might have a focal adenocarcinoma in situ above the area of the LEEP. As a result of this, she came to the hospital for conization for the diagnosis and treatment.  PHYSICAL EXAMINATION:  External and vaginal within normal limits.   Cervix is clean.  Uterus is anteverted and normal size and shape.  Adnexa are palpably normal.  DESCRIPTION OF PROCEDURE:  After adequate general anesthesia, the patient was placed in the dorsal lithotomy position and prepped and draped in the usual sterile manner.  A 1:200,000 solution of epinephrine was injected around the cervix to aid hemostasis.  Angled sutures were then placed with 0 Vicryl.  A sharp knife conization was done trying to get deep into the canal because of the above clinical history.  This was done without difficulty.  It was removed in one piece, cut at 3 oclock, and sent to pathology for tissue diagnosis. Both Sturmdorf sutures were placed anteriorly and posteriorly as well as a 360 degree circumferential suture around the cervix to control bleeding.  All of This was done with 0 Vicryls.  In addition,  the raw base of the cone was cauterized with a ball tip with the coag setting of 50.  After all of the sutures were tied in place; hemostasis was complete.  The procedure was therefore terminated.  Estimated blood loss was less than 50 cc with none replaced.  The patient tolerated the procedure well and left the operating room in satisfactory condition. DD:  04/28/00 TD:  04/28/00 Job: [id number]

## 2010-11-04 ENCOUNTER — Emergency Department (HOSPITAL_COMMUNITY)
Admission: EM | Admit: 2010-11-04 | Discharge: 2010-11-04 | Disposition: A | Discharge status: Home / Self Care | Payer: 59 | Attending: Emergency Medicine | Admitting: Emergency Medicine

## 2010-11-04 ENCOUNTER — Emergency Department (HOSPITAL_COMMUNITY): Payer: 59

## 2010-11-04 DIAGNOSIS — N186 End stage renal disease: Secondary | ICD-10-CM | POA: Insufficient documentation

## 2010-11-04 DIAGNOSIS — R4182 Altered mental status, unspecified: Secondary | ICD-10-CM | POA: Insufficient documentation

## 2010-11-04 DIAGNOSIS — Z79899 Other long term (current) drug therapy: Secondary | ICD-10-CM | POA: Insufficient documentation

## 2010-11-04 DIAGNOSIS — F29 Unspecified psychosis not due to a substance or known physiological condition: Secondary | ICD-10-CM | POA: Insufficient documentation

## 2010-11-04 DIAGNOSIS — E1169 Type 2 diabetes mellitus with other specified complication: Secondary | ICD-10-CM | POA: Insufficient documentation

## 2010-11-04 DIAGNOSIS — Z992 Dependence on renal dialysis: Secondary | ICD-10-CM | POA: Insufficient documentation

## 2010-11-04 DIAGNOSIS — R000 Tachycardia, unspecified: Secondary | ICD-10-CM | POA: Insufficient documentation

## 2010-11-04 DIAGNOSIS — I120 Hypertensive chronic kidney disease with stage 5 chronic kidney disease or end stage renal disease: Secondary | ICD-10-CM | POA: Insufficient documentation

## 2010-11-04 DIAGNOSIS — Z794 Long term (current) use of insulin: Secondary | ICD-10-CM | POA: Insufficient documentation

## 2010-11-04 DIAGNOSIS — R569 Unspecified convulsions: Secondary | ICD-10-CM | POA: Insufficient documentation

## 2010-11-04 LAB — CBC
HCT: 32.2 % — ABNORMAL LOW (ref 36.0–46.0)
MCHC: 33.5 g/dL (ref 30.0–36.0)
MCV: 89 fL (ref 78.0–100.0)
Platelets: 278 10*3/uL (ref 150–400)
RDW: 13.2 % (ref 11.5–15.5)
WBC: 9.7 10*3/uL (ref 4.0–10.5)

## 2010-11-04 LAB — BASIC METABOLIC PANEL
BUN: 46 mg/dL — ABNORMAL HIGH (ref 6–23)
Chloride: 106 mEq/L (ref 96–112)
Creatinine, Ser: 6.25 mg/dL — ABNORMAL HIGH (ref 0.4–1.2)
GFR calc Af Amer: 9 mL/min — ABNORMAL LOW (ref >60)
GFR calc non Af Amer: 8 mL/min — ABNORMAL LOW (ref >60)
Potassium: 3.8 mEq/L (ref 3.5–5.1)

## 2010-11-04 LAB — GLUCOSE, CAPILLARY
Glucose-Capillary: 153 mg/dL — ABNORMAL HIGH (ref 70–99)
Glucose-Capillary: 153 mg/dL — ABNORMAL HIGH (ref 70–99)

## 2010-11-04 LAB — DIFFERENTIAL
Basophils Absolute: 0 10*3/uL (ref 0.0–0.1)
Eosinophils Absolute: 0.3 10*3/uL (ref 0.0–0.7)
Eosinophils Relative: 3 % (ref 0–5)
Lymphocytes Relative: 19 % (ref 12–46)
Monocytes Absolute: 0.6 10*3/uL (ref 0.1–1.0)

## 2010-11-06 ENCOUNTER — Encounter: Payer: Self-pay | Admitting: Cardiology

## 2010-11-12 ENCOUNTER — Inpatient Hospital Stay (HOSPITAL_COMMUNITY)
Admission: EM | Admit: 2010-11-12 | Discharge: 2010-11-14 | DRG: 286 | Disposition: A | Discharge status: Home / Self Care | Payer: 59 | Attending: Cardiology | Admitting: Cardiology

## 2010-11-12 ENCOUNTER — Emergency Department (HOSPITAL_COMMUNITY): Payer: 59

## 2010-11-12 DIAGNOSIS — E11319 Type 2 diabetes mellitus with unspecified diabetic retinopathy without macular edema: Secondary | ICD-10-CM | POA: Diagnosis present

## 2010-11-12 DIAGNOSIS — I200 Unstable angina: Secondary | ICD-10-CM

## 2010-11-12 DIAGNOSIS — D638 Anemia in other chronic diseases classified elsewhere: Secondary | ICD-10-CM | POA: Diagnosis present

## 2010-11-12 DIAGNOSIS — Z8249 Family history of ischemic heart disease and other diseases of the circulatory system: Secondary | ICD-10-CM

## 2010-11-12 DIAGNOSIS — F17200 Nicotine dependence, unspecified, uncomplicated: Secondary | ICD-10-CM | POA: Diagnosis present

## 2010-11-12 DIAGNOSIS — Z992 Dependence on renal dialysis: Secondary | ICD-10-CM

## 2010-11-12 DIAGNOSIS — Z7902 Long term (current) use of antithrombotics/antiplatelets: Secondary | ICD-10-CM

## 2010-11-12 DIAGNOSIS — E1039 Type 1 diabetes mellitus with other diabetic ophthalmic complication: Secondary | ICD-10-CM | POA: Diagnosis present

## 2010-11-12 DIAGNOSIS — Z882 Allergy status to sulfonamides status: Secondary | ICD-10-CM

## 2010-11-12 DIAGNOSIS — I70219 Atherosclerosis of native arteries of extremities with intermittent claudication, unspecified extremity: Secondary | ICD-10-CM | POA: Diagnosis present

## 2010-11-12 DIAGNOSIS — N186 End stage renal disease: Secondary | ICD-10-CM | POA: Diagnosis present

## 2010-11-12 DIAGNOSIS — I2510 Atherosclerotic heart disease of native coronary artery without angina pectoris: Principal | ICD-10-CM | POA: Diagnosis present

## 2010-11-12 DIAGNOSIS — Z880 Allergy status to penicillin: Secondary | ICD-10-CM

## 2010-11-12 DIAGNOSIS — N2581 Secondary hyperparathyroidism of renal origin: Secondary | ICD-10-CM | POA: Diagnosis present

## 2010-11-12 DIAGNOSIS — E785 Hyperlipidemia, unspecified: Secondary | ICD-10-CM | POA: Diagnosis present

## 2010-11-12 DIAGNOSIS — Z8541 Personal history of malignant neoplasm of cervix uteri: Secondary | ICD-10-CM

## 2010-11-12 DIAGNOSIS — I120 Hypertensive chronic kidney disease with stage 5 chronic kidney disease or end stage renal disease: Secondary | ICD-10-CM | POA: Diagnosis present

## 2010-11-12 DIAGNOSIS — Z833 Family history of diabetes mellitus: Secondary | ICD-10-CM

## 2010-11-12 DIAGNOSIS — N058 Unspecified nephritic syndrome with other morphologic changes: Secondary | ICD-10-CM | POA: Diagnosis present

## 2010-11-12 DIAGNOSIS — E1029 Type 1 diabetes mellitus with other diabetic kidney complication: Secondary | ICD-10-CM | POA: Diagnosis present

## 2010-11-12 DIAGNOSIS — Z841 Family history of disorders of kidney and ureter: Secondary | ICD-10-CM

## 2010-11-12 LAB — POCT I-STAT, CHEM 8
BUN: 38 mg/dL — ABNORMAL HIGH (ref 6–23)
Chloride: 103 mEq/L (ref 96–112)
HCT: 29 % — ABNORMAL LOW (ref 36.0–46.0)
Potassium: 4.1 mEq/L (ref 3.5–5.1)

## 2010-11-12 LAB — DIFFERENTIAL
Eosinophils Absolute: 0.3 10*3/uL (ref 0.0–0.7)
Lymphocytes Relative: 23 % (ref 12–46)
Lymphs Abs: 1.8 10*3/uL (ref 0.7–4.0)
Monocytes Relative: 6 % (ref 3–12)
Neutro Abs: 5.2 10*3/uL (ref 1.7–7.7)
Neutrophils Relative %: 68 % (ref 43–77)

## 2010-11-12 LAB — CBC
HCT: 28.9 % — ABNORMAL LOW (ref 36.0–46.0)
Hemoglobin: 10 g/dL — ABNORMAL LOW (ref 12.0–15.0)
MCH: 30.4 pg (ref 26.0–34.0)
MCV: 87.8 fL (ref 78.0–100.0)
Platelets: 238 10*3/uL (ref 150–400)
RBC: 3.29 MIL/uL — ABNORMAL LOW (ref 3.87–5.11)
WBC: 7.7 10*3/uL (ref 4.0–10.5)

## 2010-11-12 LAB — PROTIME-INR
INR: 0.93 (ref 0.00–1.49)
Prothrombin Time: 12.7 seconds (ref 11.6–15.2)

## 2010-11-12 LAB — CK TOTAL AND CKMB (NOT AT ARMC)
CK, MB: 3 ng/mL (ref 0.3–4.0)
CK, MB: 3 ng/mL (ref 0.3–4.0)
Relative Index: INVALID (ref 0.0–2.5)

## 2010-11-12 LAB — TROPONIN I: Troponin I: <0.3 ng/mL (ref <0.30)

## 2010-11-12 LAB — GLUCOSE, CAPILLARY
Glucose-Capillary: 209 mg/dL — ABNORMAL HIGH (ref 70–99)
Glucose-Capillary: 283 mg/dL — ABNORMAL HIGH (ref 70–99)

## 2010-11-13 DIAGNOSIS — I2510 Atherosclerotic heart disease of native coronary artery without angina pectoris: Secondary | ICD-10-CM

## 2010-11-13 DIAGNOSIS — R079 Chest pain, unspecified: Secondary | ICD-10-CM

## 2010-11-13 LAB — COMPREHENSIVE METABOLIC PANEL
BUN: 48 mg/dL — ABNORMAL HIGH (ref 6–23)
CO2: 28 mEq/L (ref 19–32)
Calcium: 8.1 mg/dL — ABNORMAL LOW (ref 8.4–10.5)
Creatinine, Ser: 4.58 mg/dL — ABNORMAL HIGH (ref 0.50–1.10)
GFR calc Af Amer: 13 mL/min — ABNORMAL LOW (ref >60)
GFR calc non Af Amer: 11 mL/min — ABNORMAL LOW (ref >60)
Glucose, Bld: 386 mg/dL — ABNORMAL HIGH (ref 70–99)
Total Bilirubin: 0.1 mg/dL — ABNORMAL LOW (ref 0.3–1.2)

## 2010-11-13 LAB — GLUCOSE, CAPILLARY
Glucose-Capillary: 189 mg/dL — ABNORMAL HIGH (ref 70–99)
Glucose-Capillary: 200 mg/dL — ABNORMAL HIGH (ref 70–99)
Glucose-Capillary: 261 mg/dL — ABNORMAL HIGH (ref 70–99)

## 2010-11-13 LAB — CBC
MCH: 29.8 pg (ref 26.0–34.0)
MCHC: 33.3 g/dL (ref 30.0–36.0)
Platelets: 225 10*3/uL (ref 150–400)
RBC: 3.22 MIL/uL — ABNORMAL LOW (ref 3.87–5.11)
RDW: 12.8 % (ref 11.5–15.5)

## 2010-11-13 LAB — HCG, SERUM, QUALITATIVE: Preg, Serum: NEGATIVE

## 2010-11-13 LAB — LIPID PANEL
HDL: 25 mg/dL — ABNORMAL LOW (ref >39)
LDL Cholesterol: UNDETERMINED mg/dL (ref 0–99)
Total CHOL/HDL Ratio: 8.8 RATIO
Triglycerides: 449 mg/dL — ABNORMAL HIGH (ref <150)

## 2010-11-13 LAB — CARDIAC PANEL(CRET KIN+CKTOT+MB+TROPI)
Total CK: 47 U/L (ref 7–177)
Total CK: 42 U/L (ref 7–177)

## 2010-11-14 ENCOUNTER — Encounter: Payer: Self-pay | Admitting: *Deleted

## 2010-11-14 ENCOUNTER — Inpatient Hospital Stay (HOSPITAL_COMMUNITY): Payer: 59

## 2010-11-14 DIAGNOSIS — I200 Unstable angina: Secondary | ICD-10-CM

## 2010-11-14 LAB — BASIC METABOLIC PANEL
BUN: 60 mg/dL — ABNORMAL HIGH (ref 6–23)
CO2: 24 mEq/L (ref 19–32)
Calcium: 7.7 mg/dL — ABNORMAL LOW (ref 8.4–10.5)
Creatinine, Ser: 5.57 mg/dL — ABNORMAL HIGH (ref 0.50–1.10)

## 2010-11-14 LAB — GLUCOSE, CAPILLARY: Glucose-Capillary: 168 mg/dL — ABNORMAL HIGH (ref 70–99)

## 2010-11-14 LAB — CBC
MCH: 30.7 pg (ref 26.0–34.0)
MCV: 91.5 fL (ref 78.0–100.0)
Platelets: 200 10*3/uL (ref 150–400)
RBC: 2.83 MIL/uL — ABNORMAL LOW (ref 3.87–5.11)
RDW: 13.1 % (ref 11.5–15.5)

## 2010-11-20 ENCOUNTER — Encounter: Payer: Self-pay | Admitting: Cardiology

## 2010-11-20 ENCOUNTER — Ambulatory Visit (INDEPENDENT_AMBULATORY_CARE_PROVIDER_SITE_OTHER): Payer: 59 | Admitting: Cardiology

## 2010-11-20 VITALS — BP 119/72 | HR 82 | Ht 76.0 in | Wt 225.0 lb

## 2010-11-20 DIAGNOSIS — I2510 Atherosclerotic heart disease of native coronary artery without angina pectoris: Secondary | ICD-10-CM | POA: Insufficient documentation

## 2010-11-20 DIAGNOSIS — E785 Hyperlipidemia, unspecified: Secondary | ICD-10-CM | POA: Insufficient documentation

## 2010-11-20 DIAGNOSIS — I10 Essential (primary) hypertension: Secondary | ICD-10-CM

## 2010-11-20 DIAGNOSIS — N186 End stage renal disease: Secondary | ICD-10-CM

## 2010-11-20 MED ORDER — ATORVASTATIN CALCIUM 80 MG PO TABS: 80.0000 mg | ORAL_TABLET | Freq: Every day | ORAL | Status: DC

## 2010-11-20 NOTE — Assessment & Plan Note (Signed)
Blood pressure well controlled on her current medical regimen

## 2010-11-20 NOTE — Assessment & Plan Note (Signed)
The patient has significant dyslipidemia and hypertriglyceridemia. She has been switched from Zocor to Lipitor because of the combination with amlodipine. Followup lipid panel as indicated

## 2010-11-20 NOTE — Assessment & Plan Note (Signed)
Significant atherosclerosis of the coronary arteries but only with a single tight lesion in the mid vessel was relatively small PDA. Continue medical therapy.

## 2010-11-20 NOTE — Progress Notes (Signed)
HPI The patient is a 35 year old female with a historof insulin-dependent diabetes mellitus,  hypertension nephrotic syndrome now with end-stage renal disease on hemodialysis. The patient is being dialyzed through an AV fistula. Several weeks ago she developed chest pain during hemodialysis and was referred for cardiac catheterization. The patient was found to have scattered coronary artery disease with a 70-80% stenosis in a relatively small PDA vessel. It was felt this could be treated medically. The patient is now on aggressive medical therapy with statin agents also stopped smoking. She continued to be followed by nephrology. From a cardiac standpoint section doing well and reports no recurrent chest pain and no need for sublingual nitroglycerin. She also reports no palpitations presyncope or syncope. She reports no complications at the groin site where catheterization was performed.   Allergies  Allergen Reactions  . Aspirin     REACTION: free bleeder  . Cephalexin     REACTION: ?dz at young age  . Sulfamethoxazole W/Trimethoprim     REACTION: thrush    Current Outpatient Prescriptions on File Prior to Visit  Medication Sig Dispense Refill  . amLODipine (NORVASC) 10 MG tablet Take 10 mg by mouth daily.        . carvedilol (COREG) 25 MG tablet Take 25 mg by mouth 2 (two) times daily.        . cloNIDine (CATAPRES) 0.1 MG tablet Take 1 tablet (0.1 mg total) by mouth 2 (two) times daily. D/C CLONIDINE PATCHES/MONITOR BP CLOSELY X'S 2 DAYS  60 tablet  11  . clopidogrel (PLAVIX) 75 MG tablet Take 75 mg by mouth daily.        . isosorbide mononitrate (IMDUR) 120 MG 24 hr tablet Take 120 mg by mouth daily.        . nitroGLYCERIN (NITROSTAT) 0.4 MG SL tablet Place 0.4 mg under the tongue every 5 (five) minutes as needed.        Marland Kitchen DISCONTD: simvastatin (ZOCOR) 40 MG tablet Take 40 mg by mouth daily.        Marland Kitchen DISCONTD: ferrous sulfate (MEIJER FERROUS SULFATE) 325 (65 FE) MG tablet Take 325 mg by  mouth daily with breakfast.        . DISCONTD: furosemide (LASIX) 20 MG tablet Take 20 mg by mouth 3 (three) times daily.        Marland Kitchen DISCONTD: gabapentin (NEURONTIN) 600 MG tablet Take 600 mg by mouth 3 (three) times daily.        Marland Kitchen DISCONTD: insulin NPH-insulin regular (HUMULIN 70/30) (70-30) 100 UNIT/ML injection as directed.        Marland Kitchen DISCONTD: lisinopril (PRINIVIL,ZESTRIL) 20 MG tablet Take 20 mg by mouth 2 (two) times daily.        Marland Kitchen DISCONTD: sevelamer (RENVELA) 800 MG tablet Take 800 mg by mouth 3 (three) times daily with meals.          Past Medical History  Diagnosis Date  . DM (diabetes mellitus)     Long-term insulin  . Hypertension   . Chronic anemia     2nd to renal disease  . Tobacco use disorder     Discontinued March 2012  . Iron deficiency anemia   . Hyperlipidemia     Hypertriglyceridemia 449 HDL 25  . End stage renal disease on dialysis     On dialysis since March 2012 status post AV fistula  . Diabetic Charcot's joint disease   . Coronary artery disease     Status post cardiac catheterization June 2012  scattered coronary artery disease/atherosclerosis with 70-80% stenosis in a small palpable right PDA lesion.  . 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 Gastroparesis   . Peripheral vascular disease     Tibial occlusive disease evaluated by Dr. Hart Rochester in August 2011. Medical therapy  . 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 Dilation and curettage, diagnostic / therapeutic     No family history on file.  History   Social History  . Marital Status: Married    Spouse Name: [name]    Number of Children: [name]  . Years of Education: [name]   Occupational History  .  [employer]   Social History Main Topics  . Smoking status: Former Smoker -- 0.3 packs/day for 10 years    Types: Cigarettes    Quit date: 05/27/2008  . Smokeless tobacco: Never Used   Comment: Smokes several cigarettes a day (been smoking for 6 years)  . Alcohol Use:  Not on file  . Drug Use: Not on file  . Sexually Active: Not on file   Other Topics Concern  . Not on file   Social History Narrative  . No narrative on file    [id number] positives as outlined above. The remainder of the 18  point review of systems is negative  PHYSICAL EXAM BP 119/72  Pulse 82  Ht 6\' 4"  (1.93 m)  Wt 225 lb (102.059 kg)  BMI 27.39 kg/m2  LMP 10/29/2010  General: Well-developed, well-nourished in no distress Head: Normocephalic and atraumatic Eyes:PERRLA/EOMI intact, conjunctiva and lids normal Ears: No deformity or lesions Mouth:normal dentition, normal posterior pharynx Neck: Supple, no JVD.  No masses, thyromegaly or abnormal cervical nodes. No carotid bruits. Lungs: Normal breath sounds bilaterally without wheezing.  Normal percussion Cardiac: regular rate and rhythm with normal S1 and S2, no S3 or S4.  PMI is normal.  No pathological murmurs Abdomen: Normal bowel sounds, abdomen is soft and nontender without masses, organomegaly or hernias noted.  No hepatosplenomegaly MSK: Back normal, normal gait muscle strength and tone normal Vascular: Pulse is normal in all 4 extremities Extremities: No peripheral pitting edema, AV fistula left forearm thrill present  Neurologic: Alert and oriented x 3 Skin: Intact without lesions or rashes Lymphatics: No significant adenopathy Psychologic: Normal affect   ECG: not available   ASSESSMENT AND PLAN

## 2010-11-20 NOTE — Assessment & Plan Note (Signed)
On hemodialysis through an AV fistula. No complications. Tolerating dialysis well. No recurrent angina on hemodialysis

## 2010-11-20 NOTE — Patient Instructions (Signed)
Continue all current medications. Your physician wants you to follow up in: 6 months.  You will receive a reminder letter in the mail one-two months in advance.  If you don't receive a letter, please call our office to schedule the follow up appointment   

## 2010-11-29 ENCOUNTER — Other Ambulatory Visit: Payer: Self-pay | Admitting: Cardiology

## 2010-12-04 NOTE — Discharge Summary (Signed)
NAMEMAUDE, [name] NO.:  [id number]  MEDICAL RECORD NO.:  [id number]  LOCATION:  2003                         FACILITY:  [facility]  PHYSICIAN:  Noralyn Pick. Eden Emms, MD, FACCDATE OF BIRTH:  09-Dec-1975  DATE OF ADMISSION:  11/12/2010 DATE OF DISCHARGE:  11/14/2010                              DISCHARGE SUMMARY   PRIMARY CARDIOLOGIST:  Learta Codding, MD, [facility].  PRIMARY CARE PROVIDER:  Doreen Beam, MD  DISCHARGE DIAGNOSIS:  Unstable angina.  SECONDARY DIAGNOSES: 1. Nonobstructive coronary artery disease by catheterization this     admission. 2. End-stage renal disease on dialysis Monday, Wednesday, and Friday     since July 27, 2010. 3. Diabetes mellitus, diagnosed 22 years ago. 4. History of cervical cancer status post conization, February 2001. 5. Hypertension. 6. Hyperlipidemia. 7. Peripheral vascular disease with claudication. 8. Anemia of chronic disease. 9. Status post dilation and curettage. 10.History of Charcot joints. 11.Ongoing tobacco abuse.  ALLERGIES:  KEFLEX, SEPTRA, and ASPIRIN.  PROCEDURES:  Left heart cardiac catheterization performed, November 13, 2010 showing 30-40% proximal stenosis in the LAD, 40-50% distal RCA stenosis and 70-80% stenosis in the PDA.  EF was normal.  Medical therapy was recommended.  HISTORY OF PRESENT ILLNESS:  A 35 year old female with longstanding history of diabetes mellitus and chronic kidney disease with initiation of dialysis earlier this year.  The patient has known abnormal Myoview in the past, but has been medically managed secondary to renal disease and preference to avoid contrast.  Over the past 2 months, the patient has been noting increasing frequency of rest, exertional substernal chest discomfort, relieved with rest or nitrates.  On the day of admission, the patient was at dialysis and approximately 1 hour into her dialysis session, began to experience 8/10 substernal chest pressure associated with  nausea.  Dialysis was discontinued and EMS was called.  The patient was treated with multiple sublingual nitroglycerin with reduction of not complete relief of chest discomfort.  She was taken to the [facility] ED where she continued to have mild chest discomfort.  ECG showed lateral ST depression with dynamic T- waves which was new since 2009 and likely related LVH.  She was admitted for further evaluation.  HOSPITAL COURSE:  The patient ruled out for MI.  With multiple risk factors and history of abnormal Myoview in the past along with initiation of dialysis early this year, decision was made pursue catheterization.  She underwent diagnostic catheterization on June 19 revealing nonobstructive CAD and normal LV function.  She did have 70- 80% stenosis in a small-caliber right posterior descending artery and medical therapy was recommended.  She has been maintained on aspirin, Plavix, beta-blocker, and long-acting nitrate therapy and we have also initiated a statin.  The patient has been followed by Nephrology throughout this admission and underwent dialysis this morning without complication.  She has also been counseled on the importance of smoking cessation and will be discharged home today in good condition.  DISCHARGE LABORATORIES:  Hemoglobin 8.7, hematocrit 25.9 WBC 8.3, platelets 200, INR 0.93.  Sodium 135, potassium 4.7 chloride 100, CO2 24, BUN 60, creatinine 5.57, glucose 313, total bilirubin 0.1, alkaline phosphatase 64, AST 8, ALT 7, total  protein 5.9, albumin 2.5, calcium 7.7.  CK 42, MB 2.4, troponin-I less than 0.30.  Total cholesterol 221, triglycerides 449, HDL 25, LDL not calculated.  TSH 0.666.  HCG was negative.  DISPOSITION:  The patient will be discharged home today in good condition.  FOLLOWUP PLANS AND APPOINTMENTS:  The patient is to follow up with Dr. Andee Lineman already scheduled for June 26 at 8:30.  She will continue to follow up Dr. Sherril Croon as well as [facility].  DISCHARGE MEDICATIONS: 1. Atorvastatin 40 mg nightly. 2. Gabapentin 600 mg nightly. 3. NovoLog 6-8 units subcu t.i.d. with meals, sliding scale. 4. Nitroglycerin 0.4 mg sublingual p.r.n. chest pain. 5. Carvedilol 25 mg b.i.d. 6. Clonidine 0.1 mg b.i.d. 7. Imdur 120 mg daily. 8. Lantus 22 units nightly. 9. Norvasc 10 mg b.i.d. 10.Plavix 75 mg daily. 11.Rena-Vite 1 tablet daily. 12.Tums 4 tablets t.i.d.  OUTSTANDING LABS AND STUDIES:  The patient will have will require followup with LFTs in approximately 6-8 weeks given initiation of statin therapy.  DURATION DISCHARGE ENCOUNTER:  40 minutes including physician time.     Nicolasa Ducking, ANP   ______________________________ Noralyn Pick. Eden Emms, MD, [facility]    CB/MEDQ  D:  11/14/2010  T:  11/15/2010  Job:  [id number]  cc:   Doreen Beam, MD  [facility]  Electronically Signed by Nicolasa Ducking ANP on 11/16/2010 02:34:10 PM Electronically Signed by Charlton Haws MD [facility] on 12/04/2010 12:51:32 PM

## 2010-12-04 NOTE — Consult Note (Signed)
[name], [name] NO.:  [id number]  MEDICAL RECORD NO.:  [id number]  LOCATION:  2003                         FACILITY:  [facility]  PHYSICIAN:  Fayrene Fearing L. Otoniel Myhand, M.D.DATE OF BIRTH:  [birth year]  DATE OF CONSULTATION: 11/14/2010                                 CONSULTATION   REASON FOR CONSULTATION:  Management of end-stage renal disease and hemodialysis, anemia, hypertension, volume status and secondary hyperparathyroidism.  HISTORY OF PRESENT ILLNESS:  [name] is a 35 year old white female with end-stage renal disease secondary to diabetic nephropathy on hemodialysis since March 2012.  She transferred to [facility] from Dr. Kristian Covey and recently moved to the [place] area. The patient has had type 1 diabetes mellitus for approximately 22 years and has associated complications of retinopathy, neuropathy and gastroparesis.  The patient presented to dialysis today complaining of not feeling well and thought her blood sugar was low butwhen checked during dialysis, it was 224 via Accu-Chek.  During dialysis the patient had onset of nausea with chest pain radiating to her left neck area and associated with diaphoresis.  Pain was 7/10.  Her blood pressure is 165/81.  EMS was called and the patient was transferred to [facility] Emergency Department for further evaluation.  Arrival here was delayed secondary to ambulance breakdown.  She was treated with IV nitroglycerin and aspirin.  The patient was transferred to another ambulance prior to arrival at [facility] ER.  Also of interest was she recently had evaluation at the [facility] Emergency Department for a seizure secondary to hypoglycemia on June 10.  Head CT at that time was negative.  She also had a chest x-ray was negative for acute disease.  PAST MEDICAL HISTORY:  Type 1 diabetes mellitus as mentioned above, hyperlipidemia, hypertension, peripheral vascular disease with history of  tibial occlusive disease, Dr. Hart Rochester evaluation in August 2011, coronary artery disease with abnormal stress test evaluated by Dr. Andee Lineman at [facility].  Cardiac catheterization was not done because she was pre-ESRD.  History of diastolic dysfunction, history of carcinoma in situ status post LEEP, headaches on dialysis, history of Charcot joint.  SOCIAL HISTORY:  The patient now lives in [place] with her husband. She is disabled, but previously worked at [employer].  She has a 35 year old son.  She quit smoking in 2010.  She does not use alcohol or illicit drugs.  She has high school education.  FAMILY HISTORY:  Very significant.  Mother died of complications from end-stage renal disease.  Mother was a diabetic.  Father has diabetes, hypertension, coronary artery disease. Uncle had end-stage renal disease secondary to diabetes and received a transplant.  The patient has 2 sisters, one has sight problems, the other has diabetes mellitus.  REVIEW OF SYSTEMS:  GENERAL:  No fever, chills, weight change or fatigue.  The patient reported sweats on dialysis today.  HEENT: Headaches on dialysis.  No visual changes or hearing loss.  SKIN:  No new rashes or lesions.  CARDIOPULMONARY:  The patient reports having chest pain at times.  She is unclear of the duration they last, maybe 30 minutes, sometimes occurs once a week.  The patient states she may be  at rest when chest pain comes on.  There is no associated pattern. Sometimes she takes nitroglycerin, sometimes she just let them go away on their own.  The patient reported shortness of breath last night, but no orthopnea or dyspnea on excursion or paroxysmal dyspnea.  She denied edema or cough.  ENDO:  No polyuria or polydipsia.  GU:  The patient reports small amount of urine.  No dysuria or urgency.  NEURO PSYCH:  No weakness, depression or anxiety.  MUSCULOSKELETAL:  No new joint pain or swelling.  GI:  Denied nausea, vomiting,  diarrhea, but did have nausea with onset of chest pain today.  No change in bowel habits.  Dialysis orders Monday, Wednesday, Friday [facility]. Estimated dry weight 100 kg, 4 hours duration, left upper AV fistula, Neupogen 1500 units IV every dialysis, tight heparin blood flow rate 400, dialysate flow rate 600, variable sodium 148 linear.  ALLERGIES:  VENOFER causes nausea and vomiting.  Staff attempted to give twice.  SEPTRA caused thrush.  Unclear reaction to [facility].  CURRENT MEDICATIONS: 1. Gabapentin 600 mg at bedtime. 2. Rena-Vite. 3. Tums Ultra 1000 mg 4 with meals 3 times a day. 4. Plavix 75 mg per day. 5. Norvasc 10 mg per day. 6. Clonidine 0.1 mg b.i.d. 7. Isosorbide 120 mg daily. 8. Lantus 20 units subcu at bedtime. 9. Humalog units 68 t.i.d. with meals. 10.Carvedilol 25 mg b.i.d. ( said she was prescribed this by her     heart MD) 11.Labetalol 200 mg b.i.d., said this was prescribed by her kidney     doctor.  PHYSICAL EXAMINATION:  The patient was examined by Dr. Darrick Penna. VITAL SIGNS:  Pulse 68, respirations 15, blood pressure 145/82, O2 sats 99% on room air. GENERAL:  Pale, withdrawn, minimal eye contact. HEENT:  Normocephalic, atraumatic.  Sclerae are clear.  Severe diabetic retinopathy. NECK:  Supple. HEART:  Regular rate.  Positive S4, 2/6 murmur. LUNGS:  Clear to auscultation without wheezes, rales or rubs. SKIN:  Pale. GI:  Abdomen, positive bowel sounds, soft, nontender, nondistended.  No rebound, no guarding.  Liver down 4 cm. EXTREMITIES:  Trace lower extremity edema bilaterally.  No dorsalis pedis pulses bilaterally.  Access, left upper AV fistula cannulated. MUSCULOSKELETAL:  No gross deformities. NEURO:  Alert and oriented x3.  Cranial nerves II-XII grossly intact.  EKG; normal sinus rhythm, prolonged QT, LVH.  Labs pending.  ASSESSMENT AND PLAN: 1. Nausea and vomiting, likely related to chest pain.  The patient is     in  the initial steps of her evaluation.  We will need cardiac     consult given previous abnormal stress test. 2. End-stage renal disease.  She received 1 hour dialysis of her usual     Monday, Wednesday, Friday schedule.  We will recheck labs today and     see if she needs to finish her dialysis today or tomorrow. 3. Anemia.  Hemoglobin 9.5, June 6, down from 11.2 May 22.  Her iron     studies on June 11 were 46% saturation, ferritin 220.  She has been     in a rather low dose of Epogen.  We will increase this dose.  No     iron is needed at this time.  CBC is pending. 4. Secondary hyperparathyroidism.  Recent intact PTH was 432.  She is     not on Zemplar.  We will initiate that this admission.  Continue     usual binders.  Labs  pending. 5. Coronary artery disease, as per number 1. 6. Hypertension.  She is on multiple meds including two different beta     blockers.  It appears that her prior nephrologist, Dr. Kristian Covey was     not aware she was on Coreg and likewise her cardiologist may not     have been aware she was on labetalol.  Nonetheless, I would     discontinue labetalol and also Norvasc and try to decrease her     volume.  I would continue her on clonidine at this time.  She will     need a lower dry weight. 7. Type 1 diabetes mellitus per primary service. 8. Hyperlipidemia.  She is not on statin I can tell per primary     service and cardiology. 9. Peripheral vascular disease. 10.Diabetic retinopathy.  She needs ophthalmology followup soon after     discharge.  The patient was seen and examined by Dr. Darrick Penna     during her evaluation today.     Weston Settle, P.A-C.   ______________________________ Llana Aliment. Delaine Hernandez, M.D.    MB/MEDQ  D:  11/12/2010  T:  11/13/2010  Job:  [id number]  cc:   [facility] Cardiology [facility]  Electronically Signed by Weston Settle P.A. on 11/20/2010 12:26:45 PM Electronically Signed by Beryle Lathe M.D. on  12/04/2010 11:44:09 AM

## 2010-12-06 ENCOUNTER — Ambulatory Visit (INDEPENDENT_AMBULATORY_CARE_PROVIDER_SITE_OTHER): Payer: 59 | Admitting: General Surgery

## 2010-12-06 ENCOUNTER — Encounter (INDEPENDENT_AMBULATORY_CARE_PROVIDER_SITE_OTHER): Payer: Self-pay | Admitting: General Surgery

## 2010-12-06 VITALS — BP 180/102 | HR 80 | Temp 97.2°F | Ht 76.0 in | Wt 225.8 lb

## 2010-12-06 DIAGNOSIS — L02214 Cutaneous abscess of groin: Secondary | ICD-10-CM

## 2010-12-06 DIAGNOSIS — L02219 Cutaneous abscess of trunk, unspecified: Secondary | ICD-10-CM

## 2010-12-06 DIAGNOSIS — Z872 Personal history of diseases of the skin and subcutaneous tissue: Secondary | ICD-10-CM | POA: Insufficient documentation

## 2010-12-06 HISTORY — DX: Personal history of diseases of the skin and subcutaneous tissue: Z87.2

## 2010-12-06 MED ORDER — DOXYCYCLINE HYCLATE 50 MG PO CAPS: ORAL_CAPSULE | ORAL | Status: DC

## 2010-12-06 NOTE — Progress Notes (Signed)
Subjective:     Patient ID: [name], female   DOB: Feb 10, 1976, 35 y.o.   MRN: [id number]    BP 180/102  Pulse 80  Temp(Src) 97.2 F (36.2 C) (Temporal)  Ht 6\' 4"  (1.93 m)  Wt 225 lb 12.8 oz (102.422 kg)  BMI 27.49 kg/m2  LMP 10/29/2010    HPI 35 year old Caucasian female with insulin-dependent diabetes mellitus, chronic kidney disease on dialysis who comes in complaining of an abscess in her right groin for the past several days. She states that she has had several abscesses before in different areas of her body. This area in her right groin became acutely worse over the past 4 hours. She denies any fevers or chills or drainage. She denies any injury to the area. She states that it is red, swollen, and tender.  Past Medical History  Diagnosis Date  . DM (diabetes mellitus)     Long-term insulin  . Hypertension   . Chronic anemia     2nd to renal disease  . Tobacco use disorder     Discontinued March 2012  . Iron deficiency anemia   . Hyperlipidemia     Hypertriglyceridemia 449 HDL 25  . End stage renal disease on dialysis     On dialysis since March 2012 status post AV fistula  . Diabetic Charcot's joint disease   . Coronary artery disease     Status post cardiac catheterization June 2012 scattered coronary artery disease/atherosclerosis with 70-80% stenosis in a small palpable right PDA lesion.  . 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 Gastroparesis   . Peripheral vascular disease     Tibial occlusive disease evaluated by Dr. Hart Rochester in August 2011. Medical therapy  . 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 Dilation and curettage, diagnostic / therapeutic   . Av fistula repair 04/2010    Allergies  Allergen Reactions  . Aspirin     REACTION: free bleeder  . Cephalexin     REACTION: ?dz at young age  . Sulfamethoxazole W/Trimethoprim     REACTION: thrush    Current Outpatient Prescriptions on File Prior to Visit    Medication Sig Dispense Refill  . atorvastatin (LIPITOR) 80 MG tablet Take 1 tablet (80 mg total) by mouth daily.  30 tablet  11  . carvedilol (COREG) 25 MG tablet Take 25 mg by mouth 2 (two) times daily.        . cloNIDine (CATAPRES) 0.1 MG tablet Take 1 tablet (0.1 mg total) by mouth 2 (two) times daily. D/C CLONIDINE PATCHES/MONITOR BP CLOSELY X'S 2 DAYS  60 tablet  11  . clopidogrel (PLAVIX) 75 MG tablet Take 75 mg by mouth daily.        . insulin glargine (LANTUS) 100 UNIT/ML injection Inject 20 Units into the skin at bedtime.        . insulin lispro (HUMALOG) 100 UNIT/ML injection Inject 6-8 Units into the skin 3 (three) times daily before meals.        . isosorbide mononitrate (IMDUR) 120 MG 24 hr tablet Take 120 mg by mouth daily.        Marland Kitchen labetalol (NORMODYNE) 200 MG tablet Take 200 mg by mouth 2 (two) times daily.        . multivitamin (RENA-VIT) TABS tablet Take 1 tablet by mouth daily.        . nitroGLYCERIN (NITROSTAT) 0.4 MG SL tablet Place 0.4 mg under the tongue  every 5 (five) minutes as needed.        . NORVASC 10 MG tablet TAKE (1) TABLET BY MOUTH ONCE DAILY.  30 each  6  . torsemide (DEMADEX) 20 MG tablet Take 20 mg by mouth 2 (two) times daily.         Review of Systems  Constitutional: Negative.   HENT: Negative.   Eyes: Negative.   Respiratory: Negative.   Cardiovascular: Negative.   Gastrointestinal: Negative.   Genitourinary: Negative.   Skin:       Boil rt groin  Neurological: Negative.   Hematological: Negative.   Psychiatric/Behavioral: Negative.        Objective:   Physical Exam  Constitutional: She is oriented to person, place, and time. She appears well-developed and well-nourished.  HENT:  Head: Normocephalic and atraumatic.  Eyes: Conjunctivae are normal. Pupils are equal, round, and reactive to light.  Neck: Normal range of motion. Neck supple. No tracheal deviation present.  Cardiovascular: Normal rate, regular rhythm and normal heart sounds.    Pulmonary/Chest: Effort normal and breath sounds normal. She has no wheezes.  Abdominal: Soft. She exhibits no distension. There is no tenderness.  Musculoskeletal: Normal range of motion.  Neurological: She is alert and oriented to person, place, and time. She has normal reflexes.  Skin:          Assessment:     Right groin abscess    Plan:     I recommended incision and drainage in the office. We discussed the risk and benefits of the proposed procedure including but not limited to bleeding, worsening infection, need for additional procedures, scarring, and recurrence.  After obtaining verbal consent the area was prepped with Betadine. 5 cc of 1% Xylocaine was infiltrated over the maximal area of induration. Using a #11 blade, I made a 1-1/2 inch skin incision directly over the maximal area of fluctuance. There is frank drainage of pus. I opened up the skin incision with a hemostat. Loculated tissue was broken-up. The wound was packed with quarter inch iodoform gauze and covered with 4 x 4's. She tolerated the procedure well.  We discussed wound care. She was given wound care instructions. I placed her on doxycycline. She will followup in one week for wound check.

## 2010-12-06 NOTE — Patient Instructions (Signed)
Abscess/Boil Care After (Furuncle) An abscess (also called a boil or furuncle) is an infected area that contains a collection of pus. Signs and symptoms of an abscess include pain, tenderness, redness, or hardness, or you may feel a moveable soft area under your skin. An abscess can occur anywhere in the body. The infection may spread to surrounding tissues causing cellulitis. A cut (incision) by the surgeon was made over your abscess and the pus was drained out. Gauze may have been packed into the space to provide a drain that will allow the cavity to heal from the inside outwards. The boil may be painful for 5 to 7 days. Most people with a boil do not have high fevers. Your abscess, if seen early, may not have localized, and may not have been lanced. If not, another appointment may be required for this if it does not get better on its own or with medications. HOME CARE INSTRUCTIONS  Only take over-the-counter or prescription medicines for pain, discomfort, or fever as directed by your caregiver.   When you bathe, soak and then remove gauze or iodoform packs at least daily or as directed by your caregiver. You may then wash the wound gently with mild soapy water. Repack with gauze or do as your caregiver directs.  SEEK IMMEDIATE MEDICAL CARE IF:  You develop increased pain, swelling, redness, drainage, or bleeding in the wound site.   You develop signs of generalized infection including muscle aches, chills, fever, or a general ill feeling.   An oral temperature above 101.5 develops, not controlled by medication.  See your caregiver for a recheck if you develop any of the symptoms described above. If medications (antibiotics) were prescribed, take them as directed. Document Released: 11/29/2004 Document Re-Released: 10/31/2009 [facility] Patient Information 2011 [place], [place].

## 2010-12-11 ENCOUNTER — Ambulatory Visit (INDEPENDENT_AMBULATORY_CARE_PROVIDER_SITE_OTHER): Payer: 59 | Admitting: Surgery

## 2010-12-11 VITALS — Temp 97.6°F

## 2010-12-11 DIAGNOSIS — L02219 Cutaneous abscess of trunk, unspecified: Secondary | ICD-10-CM

## 2010-12-11 DIAGNOSIS — L02214 Cutaneous abscess of groin: Secondary | ICD-10-CM

## 2010-12-11 NOTE — Progress Notes (Signed)
Subjective:     Patient ID: [name], female   DOB: [birth year], 35 y.o.   MRN: [id number]  HPI  She is here today status post drainage of a groin abscess by Dr. Andrey Campanile. She has no complaints. She has finished her enema. Review of Systems     Objective:   Physical Exam    On examination, the exit site is healing well. There is no erythema or purulence Assessment:        Patient status post drainage of right groin abscess Plan:     And she will followup as needed

## 2010-12-13 NOTE — Cardiovascular Report (Signed)
[name], HOLTMEYER NO.:  [id number]  MEDICAL RECORD NO.:  [id number]  LOCATION:  2003                         FACILITY:  [facility]  PHYSICIAN:  Arturo Morton. Riley Kill, MD, FACCDATE OF BIRTH:  28-Jul-1975  DATE OF PROCEDURE:  11/13/2010 DATE OF DISCHARGE:                           CARDIAC CATHETERIZATION   This young lady is a type 1 diabetic.  She has had recurrent chest pain. She has a known abnormal Myoview for which she was managed medically secondary to renal failure and at that point, was not on dialysis.  She just recently started dialysis, and has had some problems.  She has also had an anemia that has been continuing, as well as ongoing use of tobacco products.  Current study was done to assess coronary anatomy.  Of note, the patient had prior radionuclide imaging which shows some inferior basal ischemia, as well as some anterior ischemia.  The current study was done to assess coronary anatomy.  PROCEDURES:1. Left heart catheterization. 2. Selective coronary arteriography. 3. Selective left ventriculography.  DESCRIPTION OF PROCEDURE:  We chose the femoral approach in order to preserve her access sites.  She does have a left radial AV graft.  There were no complications.  She was taken to the holding area in satisfactory clinical condition.  ACT was appropriate for sheath removal.  HEMODYNAMIC DATA: 1. Central aortic pressure 140/82, mean 104. 2. LV pressure 133/21.  ANGIOGRAPHIC DATA: 1. The left main is free of disease. 2. The LAD courses to the apex.  There is some eccentric plaque in the     proximal LAD of about 30-40%, but no high-grade narrowing.  There     is mild luminal irregularity distally.  There is a very large     diagonal branch as well which is also free of critical disease. 3. There is a small ramus intermedius without significant narrowing. 4. The circumflex provides a large marginal.  There is a small     marginal subbranch,  appears to be somewhat diffusely plaqued.  The     AV circumflex is without critical disease. 5. The right coronary artery has about 40% narrowing near the distal     portion of the midvessel.  Of note, there is a focal 70-80% area of     stenosis in midportion of the posterior descending branch.  This     posterior descending branch is about just over 2 mm in size.  The     distal vessel is modest in distribution. 6. The ventriculogram demonstrates well-preserved global systolic     function without segmental wall motion abnormality.  CONCLUSIONS: 1. Normal overall left ventricular systolic function. 2. Scattered disease with highest grade lesion being a 70-80% focal     stenosis in the mid PDA.  DISPOSITION:  The patient could be managed percutaneously, but the PDA vessel caliber is relatively small, and the distribution at least midway through the vessel.  Her previous nuclear scans suggested ischemia more proximally in the inferior segment, as such, I have spoken with Dr. Andee Lineman, we would lean towards an initial medical approach.  If she continues to have symptoms, she would be a candidate for PCI.  However, the patient is also anemic, and given her substantial anemia, we have some concern about dual antiplatelet therapy.     Arturo Morton. Riley Kill, MD, [facility]     TDS/MEDQ  D:  11/13/2010  T:  11/14/2010  Job:  [id number]  cc:   CV Laboratory Learta Codding, MD,FACC Doreen Beam, MD [facility], M.D.  Electronically Signed by Shawnie Pons MD [facility] on 12/13/2010 07:22:56 AM

## 2010-12-13 NOTE — H&P (Signed)
[name], [name] NO.:  [id number]  MEDICAL RECORD NO.:  [id number]  LOCATION:  2003                         FACILITY:  [facility]  PHYSICIAN:  Arturo Morton. Riley Kill, MD, FACCDATE OF BIRTH:  02-10-1976  DATE OF ADMISSION:  11/12/2010 DATE OF DISCHARGE:                             HISTORY & PHYSICAL   PRIMARY CARDIOLOGIST:  Learta Codding, MD, [facility].  PRIMARY CARE PROVIDER:  Doreen Beam, MD.  PATIENT PROFILE:  A 35 year old female with history of type 1 diabetes mellitus x22 years as well as end-stage renal disease who presented secondary to recurrent angina during dialysis.  PROBLEM LIST: 1. Unstable angina.     a.     History of abnormal Myoview though the patient was medically      managed secondary to renal failure and at that point was not on      dialysis. 2. End-stage renal disease, on dialysis since March 2012. 3. Diabetes mellitus, diagnosed 22 years ago. 4. Cervical cancer, status post conization in February 2001. 5. Hypertension. 6. Hyperlipidemia. 7. Peripheral vascular disease with claudication.     a.     History of ABIs with tibial occlusive disease.  Previously      evaluated by Dr. Hart Rochester in August 2011. 8. Anemia of chronic disease. 9. Status post D and C. 10.History of Charcot joints. 11.Ongoing tobacco abuse.  ALLERGIES:  KEFLEX, SEPTRA ASPIRIN.  HISTORY OF PRESENT ILLNESS:  A 35 year old female with history of type 1 diabetes mellitus on insulin x22 years as well as end-stage renal disease.  The patient has a several year history of rest and exertional substernal chest discomfort and has been evaluated by Cardiology in the past with Myoview which was abnormal.  A decision was made to proceed cautiously because at that time the patient has had elevated creatinine but was not yet on dialysis.  She has been managed medically and has been on excellent regimen, however, has continued to have occasional rest and exertional chest discomfort.   She feels that episodes of chest discomfort has increased in the past few months and now occurs several times per week, usually lasting about 10 or 15 minutes and resolving either spontaneously or after a sublingual nitroglycerin tablet.  Today, the patient was dialysis and approximately 1 hour into dialysis she developed 8/10 substernal chest pressure with nausea.  This was at approximately 1:00-1:30 p.m.  Dialysis was discontinued and EMS was called.  The patient was given multiple nitroglycerin tablets which resulted in reduction in chest discomfort and massive headache.  The patient has been taken to the [facility] ED where currently she reports somewhere between 1-2/10 chest discomfort, but more than anything complains of headache.  So far, enzymes are negative.  ECG shows lateral ST depression with dynamic Ts which is new since 2009 and likely related to LVH.  HOME MEDICATIONS: 1. Clonidine 0.1 mg b.i.d. 2. Coreg 25 mg b.i.d. 3. Neurontin 600 mg nightly. 4. Plavix 75 mg daily. 5. Norvasc 10 mg daily. 6. Renal vitamin daily. 7. Tums Ultra 1000 mg four times a day. 8. Imdur 120 mg daily. 9. Lantus 22 units nightly. 10.Humalog 6-8 units t.i.d. a.c.  FAMILY HISTORY:  Mother died at age 62 with complications of diabetes and end-stage renal disease.  Father is alive at age 19 with a history of coronary artery disease and diabetes.  Has two sisters, one of which is diabetic and the other might be borderline.  SOCIAL HISTORY:  The patient lives in [place] with her daughter and husband.  She does not work.  She smokes a few cigarettes a day and has been doing this for about 15 years.  She denies alcohol or drug use. Does not routinely exercise.  REVIEW OF SYSTEMS:  Positive for headache and mild chest discomfort at this point.  Her dyspnea has resolved.  She has a history of lower extremity claudication which comes and goes.  She did have nausea with chest discomfort earlier.   She is a full code.  Otherwise, all system reviewed, negative.  PHYSICAL EXAMINATION:  VITAL SIGNS:  Temperature 97.5, heart rate 74, respirations 12, blood pressure 137/68, pulse ox 98% on 2 liters. GENERAL:  Pleasant, white female, in no acute distress.  Awake and alert and oriented x3.  She has normal affect. HEENT:  Normal. NEUROLOGIC:  Grossly intact, nonfocal. SKIN:  Warm and dry without lesions or masses. NECK:  Supple without bruits or JVD. LUNGS:  Respirations were unlabored.  Clear to auscultation. CARDIAC:  Regular S1 and S2.  No S3, S4, or murmurs. ABDOMEN:  Round, soft, nontender, nondistended.  Bowel sounds present x4. EXTREMITIES:  Warm, dry, pink.  No clubbing, cyanosis, or edema with diminished distal pulses.  No femoral bruits.  She has also a left upper extremity notable for positive bruit and thrill over the radial area.  Chest x-ray shows no acute disease.  EKG shows sinus rhythm, rate of 76, lateral ST depression, dynamic Ts/LVH.  Hemoglobin 9.9, hematocrit 29.0, WBC 7.7, platelets 238.  Sodium 136, potassium 4.1, chloride 103, BUN 38, creatinine 4.10, glucose 196.  CK 57, MB 3.0.  Troponin-I less than 0.30.  ASSESSMENT: 1. Unstable angina.  The patient presents with symptoms concerning for     angina.  She presumably has coronary artery disease with known     ischemic Myoview in the past as well as longstanding diabetes,     renal disease, and tibial occlusive disease.  We will admit and     cycle enzymes.  Add heparin and nitrates as needed.  Continue beta-     blocker of Plavix and statin therapy and plan catheterization in     the a.m.  The patient reports an allergy to aspirin, but she does     tolerate Plavix.  We will have to try and look in for this further. 2. End-stage renal disease.  Appreciate renal input.  Continue     dialysis at their discretion. 3. Hypertension, currently stable.  Continue home medications and     follow. 4.  Hyperlipidemia.  We will add high-dose statin therapy and follow up     lipids. 5. Tobacco abuse.  Smoking cessation strongly advised.     Nicolasa Ducking, ANP   ______________________________ Arturo Morton. Riley Kill, MD, [facility]    CB/MEDQ  D:  11/12/2010  T:  11/13/2010  Job:  [id number]  Electronically Signed by Nicolasa Ducking ANP on 11/14/2010 12:39:32 PM Electronically Signed by Shawnie Pons MD [facility] on 12/13/2010 07:22:52 AM

## 2011-01-22 DIAGNOSIS — D069 Carcinoma in situ of cervix, unspecified: Secondary | ICD-10-CM | POA: Insufficient documentation

## 2011-01-31 ENCOUNTER — Other Ambulatory Visit (HOSPITAL_COMMUNITY)
Admission: RE | Admit: 2011-01-31 | Discharge: 2011-01-31 | Disposition: A | Discharge status: Home / Self Care | Payer: 59 | Source: Ambulatory Visit | Attending: Obstetrics and Gynecology | Admitting: Obstetrics and Gynecology

## 2011-01-31 ENCOUNTER — Encounter: Payer: Self-pay | Admitting: Obstetrics and Gynecology

## 2011-01-31 ENCOUNTER — Ambulatory Visit (INDEPENDENT_AMBULATORY_CARE_PROVIDER_SITE_OTHER): Payer: 59 | Admitting: Obstetrics and Gynecology

## 2011-01-31 VITALS — BP 146/70 | Ht 76.0 in | Wt 231.0 lb

## 2011-01-31 DIAGNOSIS — D069 Carcinoma in situ of cervix, unspecified: Secondary | ICD-10-CM

## 2011-01-31 DIAGNOSIS — Z01419 Encounter for gynecological examination (general) (routine) without abnormal findings: Secondary | ICD-10-CM | POA: Insufficient documentation

## 2011-01-31 DIAGNOSIS — N83209 Unspecified ovarian cyst, unspecified side: Secondary | ICD-10-CM

## 2011-01-31 NOTE — Progress Notes (Signed)
The patient is a 35 year old gravida 2 para 1-0-1-1 who came to see me today as a new patient for an annual GYN exam. She actually has been a patient previously at our office but has not been here for many years. Her cycles are now every one to 2 months. She contraceptives by condoms. She has significant medical problems including diabetes, hypertension, and end-stage renal disease which she is undergoing dialysis. 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 She's had many normal Pap smears since the procedure. Significant family history is maternal aunt had breast cancer in her 36s. Her lab work is done elsewhere. She had an MRI in [place]  earlier this year which showed a left ovarian cyst. Her PCP is to come see me. She is having no unusual bleeding or pelvic pain.  Physical examination: HEENT within normal limits. Neck: Thyroid not large. No masses. Supraclavicular nodes: not enlarged. Breasts: Examined in both sitting midline position. No skin changes and no masses. Abdomen: Soft no guarding rebound or masses or hernia. Pelvic: External: Within normal limits. BUS: Within normal limits. Vaginal:within normal limits. Good estrogen effect. No evidence of cystocele rectocele or enterocele. Cervix: clean. Uterus: Normal size and shape. Adnexa: No masses. Rectovaginal exam: Confirmatory and negative. Extremities: Within normal limits.  Assessment: Left ovarian cyst. CIN-3 with normal Paps lately. Occasional missed cycles.  Plan: It appears that her PCP is part of epic but I could not find an MRI report. The patient will get me the report but we will also schedule pelvic ultrasound. We'll start mammograms at age 59. Hopefully the patient will be a match and get a kidney transplant. She will inform me if her cycles are over 60 days. I suspect some of or irregularity is due to her medical problems.

## 2011-01-31 NOTE — Patient Instructions (Signed)
Schedule ultrasound

## 2011-02-01 NOTE — Progress Notes (Signed)
Addended byCammie Mcgee T on: 02/01/2011 02:15 PM   Modules accepted: Orders

## 2011-02-08 ENCOUNTER — Emergency Department (HOSPITAL_COMMUNITY)
Admission: EM | Admit: 2011-02-08 | Discharge: 2011-02-08 | Disposition: A | Discharge status: Home / Self Care | Payer: 59 | Attending: Emergency Medicine | Admitting: Emergency Medicine

## 2011-02-08 DIAGNOSIS — I120 Hypertensive chronic kidney disease with stage 5 chronic kidney disease or end stage renal disease: Secondary | ICD-10-CM | POA: Insufficient documentation

## 2011-02-08 DIAGNOSIS — Z794 Long term (current) use of insulin: Secondary | ICD-10-CM | POA: Insufficient documentation

## 2011-02-08 DIAGNOSIS — R112 Nausea with vomiting, unspecified: Secondary | ICD-10-CM | POA: Insufficient documentation

## 2011-02-08 DIAGNOSIS — Z992 Dependence on renal dialysis: Secondary | ICD-10-CM | POA: Insufficient documentation

## 2011-02-08 DIAGNOSIS — Z79899 Other long term (current) drug therapy: Secondary | ICD-10-CM | POA: Insufficient documentation

## 2011-02-08 DIAGNOSIS — N186 End stage renal disease: Secondary | ICD-10-CM | POA: Insufficient documentation

## 2011-02-08 DIAGNOSIS — E785 Hyperlipidemia, unspecified: Secondary | ICD-10-CM | POA: Insufficient documentation

## 2011-02-08 DIAGNOSIS — I2510 Atherosclerotic heart disease of native coronary artery without angina pectoris: Secondary | ICD-10-CM | POA: Insufficient documentation

## 2011-02-08 LAB — CBC
HCT: 30.9 % — ABNORMAL LOW (ref 36.0–46.0)
Hemoglobin: 10.6 g/dL — ABNORMAL LOW (ref 12.0–15.0)
MCH: 30 pg (ref 26.0–34.0)
MCHC: 34.3 g/dL (ref 30.0–36.0)
MCV: 87.5 fL (ref 78.0–100.0)

## 2011-02-08 LAB — URINALYSIS, ROUTINE W REFLEX MICROSCOPIC
Bilirubin Urine: NEGATIVE
Glucose, UA: 250 mg/dL — AB
Hgb urine dipstick: NEGATIVE
Protein, ur: >300 mg/dL — AB
Urobilinogen, UA: 0.2 mg/dL (ref 0.0–1.0)

## 2011-02-08 LAB — BASIC METABOLIC PANEL
BUN: 32 mg/dL — ABNORMAL HIGH (ref 6–23)
Calcium: 7.5 mg/dL — ABNORMAL LOW (ref 8.4–10.5)
Creatinine, Ser: 3.2 mg/dL — ABNORMAL HIGH (ref 0.50–1.10)
GFR calc non Af Amer: 16 mL/min — ABNORMAL LOW (ref >60)
Glucose, Bld: 107 mg/dL — ABNORMAL HIGH (ref 70–99)

## 2011-02-08 LAB — DIFFERENTIAL
Basophils Relative: 0 % (ref 0–1)
Lymphs Abs: 0.9 10*3/uL (ref 0.7–4.0)
Monocytes Absolute: 0.5 10*3/uL (ref 0.1–1.0)
Monocytes Relative: 6 % (ref 3–12)
Neutro Abs: 7.1 10*3/uL (ref 1.7–7.7)

## 2011-02-08 LAB — POCT PREGNANCY, URINE: Preg Test, Ur: NEGATIVE

## 2011-02-08 LAB — URINE MICROSCOPIC-ADD ON

## 2011-02-10 LAB — URINE CULTURE
Colony Count: >=100000
Culture  Setup Time: 201209150138

## 2011-02-19 LAB — COMPREHENSIVE METABOLIC PANEL
AST: 16
BUN: 17
CO2: 23
Calcium: 8 — ABNORMAL LOW
Chloride: 109
Creatinine, Ser: 0.96
GFR calc Af Amer: >60
GFR calc non Af Amer: >60
Total Bilirubin: 0.5

## 2011-02-19 LAB — URINALYSIS, ROUTINE W REFLEX MICROSCOPIC
Bilirubin Urine: NEGATIVE
Leukocytes, UA: NEGATIVE
Nitrite: NEGATIVE
Specific Gravity, Urine: 1.025
Urobilinogen, UA: 0.2
pH: 6

## 2011-02-19 LAB — CROSSMATCH

## 2011-02-19 LAB — URINE MICROSCOPIC-ADD ON

## 2011-02-19 LAB — CBC
HCT: 25.7 — ABNORMAL LOW
MCHC: 35
MCV: 88.5
Platelets: 197
RBC: 2.9 — ABNORMAL LOW

## 2011-02-19 LAB — ABO/RH: ABO/RH(D): A POS

## 2011-02-21 ENCOUNTER — Ambulatory Visit: Admission: RE | Admit: 2011-02-21 | Payer: 59 | Source: Ambulatory Visit

## 2011-02-21 ENCOUNTER — Ambulatory Visit (INDEPENDENT_AMBULATORY_CARE_PROVIDER_SITE_OTHER): Payer: 59 | Admitting: Obstetrics and Gynecology

## 2011-02-21 DIAGNOSIS — N949 Unspecified condition associated with female genital organs and menstrual cycle: Secondary | ICD-10-CM

## 2011-02-21 DIAGNOSIS — N83209 Unspecified ovarian cyst, unspecified side: Secondary | ICD-10-CM

## 2011-02-21 NOTE — Progress Notes (Signed)
The patient had had a CT scan done in [place] and they have seen a 3 cm left ovarian cyst. She came back today for ultrasound ,she has a normal uterus. Her endometrial echo is thin at 4.3 mm.  She has a normal right ovary. She has a normal left ovary. She does have a paraovarian cyst on the left side. It is 1.5 cm. Her cul-de-sac is free of fluid.  Assessment: Left paraovarian cyst  Plan: Patient reassured. I suspect what they saw on CT scan was is left parovarian cyst.

## 2011-03-07 NOTE — Progress Notes (Signed)
This encounter was created in error - please disregard.

## 2011-04-23 ENCOUNTER — Encounter (INDEPENDENT_AMBULATORY_CARE_PROVIDER_SITE_OTHER): Payer: 59 | Admitting: Surgery

## 2011-04-25 ENCOUNTER — Ambulatory Visit (INDEPENDENT_AMBULATORY_CARE_PROVIDER_SITE_OTHER): Payer: 59 | Admitting: Obstetrics and Gynecology

## 2011-04-25 DIAGNOSIS — Z780 Asymptomatic menopausal state: Secondary | ICD-10-CM

## 2011-04-25 DIAGNOSIS — N912 Amenorrhea, unspecified: Secondary | ICD-10-CM

## 2011-04-25 DIAGNOSIS — N951 Menopausal and female climacteric states: Secondary | ICD-10-CM

## 2011-04-25 NOTE — Progress Notes (Signed)
Patient came back to see me today because she has not had a period since July, 2012. She is having no menopausal symptoms. She states that she's not been sexually active. She really feels asymptomatic.  Assessment: Secondary amenorrhea  Plan: We discussed the above in detail. FSH drawn. Nurse will call her with the results. Discussed medroxyprogesterone to prevent endometrial hyperplasia if FSH normal. Discussed calling every 3-4 months if she has other episodes of amenorrhea. She will let me know if she does not bleed after progesterone. We will call in the progesterone after we see FSH.

## 2011-05-01 MED ORDER — MEDROXYPROGESTERONE ACETATE 10 MG PO TABS: 10.0000 mg | ORAL_TABLET | Freq: Every day | ORAL | Status: DC

## 2011-05-01 NOTE — Progress Notes (Signed)
Addended byValeda Malm L on: 05/01/2011 02:42 PM   Modules accepted: Orders

## 2011-05-02 ENCOUNTER — Emergency Department (HOSPITAL_COMMUNITY): Payer: 59

## 2011-05-02 ENCOUNTER — Observation Stay (HOSPITAL_COMMUNITY)
Admission: EM | Admit: 2011-05-02 | Discharge: 2011-05-03 | DRG: 640 | Disposition: A | Discharge status: Home / Self Care | Payer: 59 | Attending: Nephrology | Admitting: Nephrology

## 2011-05-02 ENCOUNTER — Encounter (HOSPITAL_COMMUNITY): Payer: Self-pay | Admitting: Emergency Medicine

## 2011-05-02 ENCOUNTER — Other Ambulatory Visit: Payer: Self-pay

## 2011-05-02 DIAGNOSIS — E119 Type 2 diabetes mellitus without complications: Secondary | ICD-10-CM | POA: Insufficient documentation

## 2011-05-02 DIAGNOSIS — I120 Hypertensive chronic kidney disease with stage 5 chronic kidney disease or end stage renal disease: Secondary | ICD-10-CM | POA: Insufficient documentation

## 2011-05-02 DIAGNOSIS — R079 Chest pain, unspecified: Secondary | ICD-10-CM | POA: Insufficient documentation

## 2011-05-02 DIAGNOSIS — E876 Hypokalemia: Secondary | ICD-10-CM | POA: Insufficient documentation

## 2011-05-02 DIAGNOSIS — N186 End stage renal disease: Secondary | ICD-10-CM

## 2011-05-02 DIAGNOSIS — R51 Headache: Secondary | ICD-10-CM | POA: Insufficient documentation

## 2011-05-02 DIAGNOSIS — I509 Heart failure, unspecified: Secondary | ICD-10-CM | POA: Insufficient documentation

## 2011-05-02 DIAGNOSIS — N2581 Secondary hyperparathyroidism of renal origin: Secondary | ICD-10-CM | POA: Insufficient documentation

## 2011-05-02 DIAGNOSIS — D631 Anemia in chronic kidney disease: Secondary | ICD-10-CM | POA: Insufficient documentation

## 2011-05-02 DIAGNOSIS — R0602 Shortness of breath: Secondary | ICD-10-CM | POA: Insufficient documentation

## 2011-05-02 DIAGNOSIS — Z794 Long term (current) use of insulin: Secondary | ICD-10-CM | POA: Insufficient documentation

## 2011-05-02 DIAGNOSIS — Z992 Dependence on renal dialysis: Secondary | ICD-10-CM

## 2011-05-02 DIAGNOSIS — E8779 Other fluid overload: Principal | ICD-10-CM | POA: Insufficient documentation

## 2011-05-02 DIAGNOSIS — I2510 Atherosclerotic heart disease of native coronary artery without angina pectoris: Secondary | ICD-10-CM | POA: Insufficient documentation

## 2011-05-02 HISTORY — DX: Polyneuropathy, unspecified: G62.9

## 2011-05-02 HISTORY — DX: End stage renal disease: Z99.2

## 2011-05-02 HISTORY — DX: Migraine, unspecified, not intractable, without status migrainosus: G43.909

## 2011-05-02 HISTORY — DX: End stage renal disease: N18.6

## 2011-05-02 HISTORY — DX: Heart failure, unspecified: I50.9

## 2011-05-02 LAB — COMPREHENSIVE METABOLIC PANEL
ALT: 7 U/L (ref 0–35)
Calcium: 8.2 mg/dL — ABNORMAL LOW (ref 8.4–10.5)
GFR calc Af Amer: 11 mL/min — ABNORMAL LOW (ref >90)
Glucose, Bld: 110 mg/dL — ABNORMAL HIGH (ref 70–99)
Sodium: 139 mEq/L (ref 135–145)
Total Protein: 6.6 g/dL (ref 6.0–8.3)

## 2011-05-02 LAB — CBC
MCH: 30.1 pg (ref 26.0–34.0)
MCHC: 33 g/dL (ref 30.0–36.0)
MCV: 91 fL (ref 78.0–100.0)
Platelets: 212 10*3/uL (ref 150–400)
RBC: 3.46 MIL/uL — ABNORMAL LOW (ref 3.87–5.11)
RDW: 13 % (ref 11.5–15.5)

## 2011-05-02 LAB — CARDIAC PANEL(CRET KIN+CKTOT+MB+TROPI)
Relative Index: INVALID (ref 0.0–2.5)
Total CK: 60 U/L (ref 7–177)
Troponin I: <0.3 ng/mL (ref <0.30)

## 2011-05-02 LAB — DIFFERENTIAL
Eosinophils Absolute: 0.3 10*3/uL (ref 0.0–0.7)
Eosinophils Relative: 4 % (ref 0–5)
Lymphs Abs: 1.5 10*3/uL (ref 0.7–4.0)

## 2011-05-02 MED ORDER — RENA-VITE PO TABS: 1.0000 | ORAL_TABLET | Freq: Every day | ORAL | Status: DC

## 2011-05-02 MED ORDER — PENTAFLUOROPROP-TETRAFLUOROETH EX AERO: 1.0000 "application " | INHALATION_SPRAY | CUTANEOUS | Status: DC | PRN

## 2011-05-02 MED ORDER — NITROGLYCERIN 0.4 MG SL SUBL: 0.4000 mg | SUBLINGUAL_TABLET | SUBLINGUAL | Status: DC | PRN

## 2011-05-02 MED ORDER — CARVEDILOL 25 MG PO TABS
25.0000 mg | ORAL_TABLET | Freq: Two times a day (BID) | ORAL | Status: DC
  Filled 2011-05-02: qty 1

## 2011-05-02 MED ORDER — PROMETHAZINE HCL 25 MG RE SUPP: 25.0000 mg | Freq: Two times a day (BID) | RECTAL | Status: DC | PRN

## 2011-05-02 MED ORDER — ACETAMINOPHEN 325 MG PO TABS
650.0000 mg | ORAL_TABLET | Freq: Four times a day (QID) | ORAL | Status: DC | PRN
  Administered 2011-05-02: 650 mg via ORAL
  Filled 2011-05-02: qty 2

## 2011-05-02 MED ORDER — DOCUSATE SODIUM 283 MG RE ENEM: 1.0000 | ENEMA | RECTAL | Status: DC | PRN

## 2011-05-02 MED ORDER — CLONIDINE HCL 0.1 MG PO TABS
0.1000 mg | ORAL_TABLET | Freq: Two times a day (BID) | ORAL | Status: DC
  Administered 2011-05-02 – 2011-05-03 (×2): 0.1 mg via ORAL
  Filled 2011-05-02 (×4): qty 1

## 2011-05-02 MED ORDER — NEPRO/CARBSTEADY PO LIQD: 237.0000 mL | Freq: Three times a day (TID) | ORAL | Status: DC | PRN

## 2011-05-02 MED ORDER — HEPARIN SODIUM (PORCINE) 1000 UNIT/ML DIALYSIS
20.0000 [IU]/kg | INTRAMUSCULAR | Status: DC | PRN
  Administered 2011-05-03: 2100 [IU] via INTRAVENOUS_CENTRAL
  Filled 2011-05-02: qty 3

## 2011-05-02 MED ORDER — LIDOCAINE-PRILOCAINE 2.5-2.5 % EX CREA: 1.0000 "application " | TOPICAL_CREAM | CUTANEOUS | Status: DC | PRN

## 2011-05-02 MED ORDER — LABETALOL HCL 200 MG PO TABS
200.0000 mg | ORAL_TABLET | Freq: Two times a day (BID) | ORAL | Status: DC
  Administered 2011-05-02: 200 mg via ORAL
  Filled 2011-05-02 (×3): qty 1

## 2011-05-02 MED ORDER — SODIUM CHLORIDE 0.9 % IV SOLN
Freq: Once | INTRAVENOUS | Status: AC
  Administered 2011-05-02: 09:00:00 via INTRAVENOUS

## 2011-05-02 MED ORDER — ROSUVASTATIN CALCIUM 20 MG PO TABS
20.0000 mg | ORAL_TABLET | Freq: Every day | ORAL | Status: DC
  Administered 2011-05-02: 20 mg via ORAL
  Filled 2011-05-02 (×2): qty 1

## 2011-05-02 MED ORDER — ACETAMINOPHEN 650 MG RE SUPP: 650.0000 mg | Freq: Four times a day (QID) | RECTAL | Status: DC | PRN

## 2011-05-02 MED ORDER — LIDOCAINE HCL (PF) 1 % IJ SOLN
5.0000 mL | INTRAMUSCULAR | Status: DC | PRN
  Filled 2011-05-02: qty 5

## 2011-05-02 MED ORDER — DARBEPOETIN ALFA-POLYSORBATE 60 MCG/0.3ML IJ SOLN: 60.0000 ug | INTRAMUSCULAR | Status: DC

## 2011-05-02 MED ORDER — CLONIDINE HCL 0.1 MG PO TABS
0.1000 mg | ORAL_TABLET | Freq: Two times a day (BID) | ORAL | Status: DC
  Filled 2011-05-02: qty 1

## 2011-05-02 MED ORDER — NEPRO/CARBSTEADY PO LIQD: 237.0000 mL | ORAL | Status: DC | PRN

## 2011-05-02 MED ORDER — PARICALCITOL 5 MCG/ML IV SOLN: 8.0000 ug | INTRAVENOUS | Status: DC

## 2011-05-02 MED ORDER — HEPARIN SODIUM (PORCINE) 1000 UNIT/ML DIALYSIS
1000.0000 [IU] | INTRAMUSCULAR | Status: DC | PRN
  Administered 2011-05-02: 1000 [IU] via INTRAVENOUS_CENTRAL
  Filled 2011-05-02: qty 1

## 2011-05-02 MED ORDER — ZOLPIDEM TARTRATE 5 MG PO TABS: 5.0000 mg | ORAL_TABLET | Freq: Every evening | ORAL | Status: DC | PRN

## 2011-05-02 MED ORDER — SODIUM CHLORIDE 0.9 % IV SOLN: 100.0000 mL | INTRAVENOUS | Status: DC | PRN

## 2011-05-02 MED ORDER — INSULIN GLARGINE 100 UNIT/ML ~~LOC~~ SOLN
20.0000 [IU] | Freq: Every day | SUBCUTANEOUS | Status: DC
  Administered 2011-05-02: 20 [IU] via SUBCUTANEOUS
  Filled 2011-05-02: qty 3

## 2011-05-02 MED ORDER — INSULIN GLARGINE 100 UNIT/ML ~~LOC~~ SOLN: 22.0000 [IU] | Freq: Every day | SUBCUTANEOUS | Status: DC

## 2011-05-02 MED ORDER — SODIUM CHLORIDE 0.9 % IJ SOLN: 3.0000 mL | INTRAMUSCULAR | Status: DC | PRN

## 2011-05-02 MED ORDER — AMLODIPINE BESYLATE 10 MG PO TABS
10.0000 mg | ORAL_TABLET | Freq: Every day | ORAL | Status: DC
  Administered 2011-05-02 – 2011-05-03 (×2): 10 mg via ORAL
  Filled 2011-05-02 (×2): qty 1

## 2011-05-02 MED ORDER — CAMPHOR-MENTHOL 0.5-0.5 % EX LOTN
1.0000 "application " | TOPICAL_LOTION | Freq: Three times a day (TID) | CUTANEOUS | Status: DC | PRN
  Filled 2011-05-02: qty 222

## 2011-05-02 MED ORDER — SORBITOL 70 % SOLN: 30.0000 mL | Status: DC | PRN

## 2011-05-02 MED ORDER — ISOSORBIDE MONONITRATE ER 60 MG PO TB24
120.0000 mg | ORAL_TABLET | Freq: Every day | ORAL | Status: DC
  Administered 2011-05-03: 120 mg via ORAL
  Filled 2011-05-02: qty 2

## 2011-05-02 MED ORDER — CLOPIDOGREL BISULFATE 75 MG PO TABS
75.0000 mg | ORAL_TABLET | Freq: Every day | ORAL | Status: DC
  Administered 2011-05-02: 75 mg via ORAL
  Filled 2011-05-02 (×2): qty 1

## 2011-05-02 MED ORDER — ACETAMINOPHEN 325 MG PO TABS
ORAL_TABLET | ORAL | Status: AC
  Administered 2011-05-02: 650 mg via ORAL
  Filled 2011-05-02: qty 2

## 2011-05-02 MED ORDER — CARVEDILOL 25 MG PO TABS
25.0000 mg | ORAL_TABLET | Freq: Two times a day (BID) | ORAL | Status: DC
  Administered 2011-05-02 – 2011-05-03 (×2): 25 mg via ORAL
  Filled 2011-05-02 (×4): qty 1

## 2011-05-02 MED ORDER — ISOSORBIDE DINITRATE 30 MG PO TABS
30.0000 mg | ORAL_TABLET | Freq: Three times a day (TID) | ORAL | Status: DC
  Administered 2011-05-02: 30 mg via ORAL
  Filled 2011-05-02: qty 1

## 2011-05-02 MED ORDER — PROMETHAZINE HCL 25 MG PO TABS
12.5000 mg | ORAL_TABLET | Freq: Four times a day (QID) | ORAL | Status: DC | PRN
  Filled 2011-05-02: qty 1

## 2011-05-02 MED ORDER — ALTEPLASE 2 MG IJ SOLR
2.0000 mg | Freq: Once | INTRAMUSCULAR | Status: AC | PRN
  Filled 2011-05-02: qty 2

## 2011-05-02 MED ORDER — RENA-VITE PO TABS
1.0000 | ORAL_TABLET | Freq: Every day | ORAL | Status: DC
  Administered 2011-05-02: 1 via ORAL
  Filled 2011-05-02 (×3): qty 1

## 2011-05-02 MED ORDER — ACETAMINOPHEN 325 MG PO TABS
650.0000 mg | ORAL_TABLET | ORAL | Status: DC | PRN
  Administered 2011-05-02: 650 mg via ORAL

## 2011-05-02 MED ORDER — CLOPIDOGREL BISULFATE 75 MG PO TABS
75.0000 mg | ORAL_TABLET | Freq: Every day | ORAL | Status: DC
  Filled 2011-05-02: qty 1

## 2011-05-02 MED ORDER — HYDROXYZINE HCL 25 MG PO TABS: 25.0000 mg | ORAL_TABLET | Freq: Three times a day (TID) | ORAL | Status: DC | PRN

## 2011-05-02 MED ORDER — AMLODIPINE BESYLATE 10 MG PO TABS
10.0000 mg | ORAL_TABLET | Freq: Every day | ORAL | Status: DC
  Administered 2011-05-02: 10 mg via ORAL
  Filled 2011-05-02 (×2): qty 1

## 2011-05-02 MED ORDER — CALCIUM ACETATE 667 MG PO CAPS
2001.0000 mg | ORAL_CAPSULE | Freq: Three times a day (TID) | ORAL | Status: DC
  Administered 2011-05-02 – 2011-05-03 (×2): 2001 mg via ORAL
  Filled 2011-05-02 (×5): qty 3

## 2011-05-02 MED ORDER — CALCIUM CARBONATE 1250 MG/5ML PO SUSP: 500.0000 mg | Freq: Four times a day (QID) | ORAL | Status: DC | PRN

## 2011-05-02 NOTE — H&P (Signed)
[facility] History and Physical  Cc:SOB and Chest Pain  HPI: [name] is a 35 y.o. female with ESRD  Presents to ER after missing hd yesterday,"felt too sick" with SoB and sharp left sided chest pain radiating into left shoulder yesterday and more progressive today. Now in ER chest pain gone,ho Cardiac Cath. 10/2010 nonobstructive CAD and normal LV function. Noted from outpt. Dialysis records she has not stayed on treatment time signing off usually under  3.o hrs of 4 hr treatment time with URR 66 and47 last 2 months. Trouble with headaches on dialysis not resolved with numerous treatment modalities by Dr. Eliott Nine and  Ms. Penland tells me she is not going back to the [facility] MD "he is not helping me". Noted recent evaluation pending for PD because of headaches.  Dialysis Orders: Center: [facility]  on mwf . EDW 102.5kg HD Bath 2.0k2.25ca  Time 4 Heparin standard. Access lft fa avf BFR 400 DFR 800    Zemplar 8 mcg IV/HD Epogen 6200   Units IV/HD  Venofer  0  Other 0  Past Medical History  Diagnosis Date  . DM (diabetes mellitus)     Long-term insulin  . Hypertension   . Chronic anemia     2nd to renal disease  . Tobacco use disorder     Discontinued March 2012  . Iron deficiency anemia   . Hyperlipidemia     Hypertriglyceridemia 449 HDL 25  . End stage renal disease on dialysis     On dialysis since March 2012 status post AV fistula  . Diabetic Charcot's joint disease   . Coronary artery disease     Status post cardiac catheterization June 2012 scattered coronary artery disease/atherosclerosis with 70-80% stenosis in a small palpable right PDA lesion.  . 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 Gastroparesis   . Peripheral vascular disease     Tibial occlusive disease evaluated by Dr. Hart Rochester in August 2011. Medical therapy  . 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 CIN III (cervical intraepithelial neoplasia grade III) with severe dysplasia     S/P LEEP AND [facility]E    Chronic Headache  Past Surgical History  Procedure Date  . Dilation and curettage, diagnostic / therapeutic   . Av fistula repair 04/2010      Family History  Problem Relation Age of Onset  . Diabetes Mother   . Diabetes Father   . Diabetes Sister   . Breast cancer Maternal Aunt       reports that she quit smoking about 2 years ago. Her smoking use included Cigarettes. She has a 3 pack-year smoking history. She has never used smokeless tobacco. 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 Moved to [facility] June 2012  Form [place]. To Husband's job.    Allergies  Allergen Reactions  . Cephalexin     REACTION: ?dz at young age  . Sulfamethoxazole W/Trimethoprim     REACTION: thrush    Prior to Admission medications   Medication Sig Start Date End Date Taking? Authorizing Provider  amLODipine (NORVASC) 10 MG tablet Take 10 mg by mouth daily.     Yes Historical Provider, MD  atorvastatin (LIPITOR) 80 MG tablet Take 80 mg by mouth daily.     Yes Historical Provider, MD  carvedilol (COREG) 25 MG tablet Take 25 mg by mouth 2 (two) times daily.     Yes  Historical Provider, MD  cloNIDine (CATAPRES) 0.1 MG tablet Take 0.1 mg by mouth 2 (two) times daily.     Yes Historical Provider, MD  clopidogrel (PLAVIX) 75 MG tablet Take 75 mg by mouth daily.     Yes Historical Provider, MD  insulin glargine (LANTUS) 100 UNIT/ML injection Inject 20 Units into the skin at bedtime.     Yes Historical Provider, MD  insulin lispro (HUMALOG) 100 UNIT/ML injection Inject 6-8 Units into the skin 3 (three) times daily before meals.     Yes Historical Provider, MD  isosorbide mononitrate (IMDUR) 120 MG 24 hr tablet Take 120 mg by mouth daily.     Yes Historical Provider, MD  labetalol (NORMODYNE) 200 MG tablet Take 200 mg by mouth 2 (two) times daily.     Yes Historical Provider, MD  multivitamin  (RENA-VIT) TABS tablet Take 1 tablet by mouth daily.     Yes Historical Provider, MD  topiramate (TOPAMAX) 25 MG tablet Take 25 mg by mouth 2 (two) times daily.     Yes Historical Provider, MD  torsemide (DEMADEX) 20 MG tablet Take 20 mg by mouth 2 (two) times daily.     Yes Historical Provider, MD  medroxyPROGESTERone (PROVERA) 10 MG tablet Take 1 tablet (10 mg total) by mouth daily. 05/01/11 04/30/12  Trellis Paganini, MD  nitroGLYCERIN (NITROSTAT) 0.4 MG SL tablet Place 0.4 mg under the tongue every 5 (five) minutes as needed. For chest pain    Historical Provider, MD     Anti-infectives    None      Results for orders placed during the hospital encounter of 05/02/11 (from the past 48 hour(s))  CBC     Status: Abnormal   Collection Time   05/02/11  8:45 AM      Component Value Range Comment   WBC 9.1  4.0 - 10.5 (K/uL)    RBC 3.46 (*) 3.87 - 5.11 (MIL/uL)    Hemoglobin 10.4 (*) 12.0 - 15.0 (g/dL)    HCT 16.1 (*) 09.6 - 46.0 (%)    MCV 91.0  78.0 - 100.0 (fL)    MCH 30.1  26.0 - 34.0 (pg)    MCHC 33.0  30.0 - 36.0 (g/dL)    RDW 04.5  40.9 - 81.1 (%)    Platelets 212  150 - 400 (K/uL)   DIFFERENTIAL     Status: Normal   Collection Time   05/02/11  8:45 AM      Component Value Range Comment   Neutrophils Relative 73  43 - 77 (%)    Neutro Abs 6.7  1.7 - 7.7 (K/uL)    Lymphocytes Relative 17  12 - 46 (%)    Lymphs Abs 1.5  0.7 - 4.0 (K/uL)    Monocytes Relative 6  3 - 12 (%)    Monocytes Absolute 0.5  0.1 - 1.0 (K/uL)    Eosinophils Relative 4  0 - 5 (%)    Eosinophils Absolute 0.3  0.0 - 0.7 (K/uL)    Basophils Relative 1  0 - 1 (%)    Basophils Absolute 0.1  0.0 - 0.1 (K/uL)   COMPREHENSIVE METABOLIC PANEL     Status: Abnormal   Collection Time   05/02/11  8:45 AM      Component Value Range Comment   Sodium 139  135 - 145 (mEq/L)    Potassium 3.6  3.5 - 5.1 (mEq/L)    Chloride 104  96 - 112 (mEq/L)  CO2 20  19 - 32 (mEq/L)    Glucose, Bld 110 (*) 70 - 99 (mg/dL)     BUN 46 (*) 6 - 23 (mg/dL)    Creatinine, Ser 1.61 (*) 0.50 - 1.10 (mg/dL)    Calcium 8.2 (*) 8.4 - 10.5 (mg/dL)    Total Protein 6.6  6.0 - 8.3 (g/dL)    Albumin 2.6 (*) 3.5 - 5.2 (g/dL)    AST 8  0 - 37 (U/L)    ALT 7  0 - 35 (U/L)    Alkaline Phosphatase 64  39 - 117 (U/L)    Total Bilirubin 0.2 (*) 0.3 - 1.2 (mg/dL)    GFR calc non Af Amer 9 (*) >90 (mL/min)    GFR calc Af Amer 11 (*) >90 (mL/min)   CARDIAC PANEL(CRET KIN+CKTOT+MB+TROPI)     Status: Normal   Collection Time   05/02/11  8:45 AM      Component Value Range Comment   Total CK 60  7 - 177 (U/L)    CK, MB 3.4  0.3 - 4.0 (ng/mL)    Troponin I <0.30  <0.30 (ng/mL)    Relative Index RELATIVE INDEX IS INVALID  0.0 - 2.5    POCT I-STAT TROPONIN I     Status: Normal   Collection Time   05/02/11  9:15 AM      Component Value Range Comment   Troponin i, poc 0.05  0.00 - 0.08 (ng/mL)    Comment 3             EKG: normal EKG, normal sinus rhythm, unchanged from previous tracings, atrial fibrillation, , RBBB.  ROS:No fever, swts ,chills,gi neg.   HENT: Negative.  Eyes: Negative.  Respiratory: Positive for shortness of breath.  Cardiovascular: Positive for chest pain.  Gastrointestinal: Negative.  Genitourinary: Positive for menstrual problem.  She has minimal urination. She has amenorrhea, for which she was seen yesterday by her gynecologist and was prescribed Provera.  Musculoskeletal: Negative.  Neurological: Negative.  Psychiatric/Behavioral: Negative.   Physical Exam: Filed Vitals:   05/02/11 1230  BP: 194/98  Pulse: 87  Temp:   Resp: 25     General:Alert wf , sl flat affect, NAD HEENT:, eom intact, MMM,Conjuctivae nl Neck:no jvdHeart:RRR,S4 Lungs:Faint left ralesAbdomen:Obese, bs nl ,soft, nt Extremities:trace bipedal edema Neuro:ox3. Cn 2-12 grossly intact, Skin:warm and dry Dialysis Access: POs. Bruit  lft fa avf  Assessment/Plan: 1. Volume Overload CHF= prob. Noncardiac ro cardiac event =checking  ce. First set neg. Probable due to missing hd 2. ESRD -  Today hd and mwf to keep on schedule 3 Hypertension/volume  - hd and meds 4. Anemia  - epo 5. Metabolic bone disease -  zemplar and binders 6. IDDM= Lantus and ssc 7Headaches= per pt only using Tylenol at home "others don't work".  Lenny Pastel, PA-C [facility] [phone] 05/02/2011, 12:37 PM

## 2011-05-02 NOTE — ED Notes (Signed)
Report received, assumed care.  

## 2011-05-02 NOTE — ED Notes (Signed)
Pt here with left sided chest pain that started last night around 8 pm, pt was laying down on onset.  Hx of chest pain with hx of blockage

## 2011-05-02 NOTE — Progress Notes (Signed)
Pt from home. Pt arrived to floor from hemo with fluid overload. Alert and oriented x4. Ambulatory. No skin issues. Pt oliguric continent of bowel. Dialysis pt with access in left lower forearm bruit and thrill present. BP elevated on arrival BP meds were administered will have oncoming nurse follow up. No distress noted will continue to monitor. Annitta Needs, RN 05/02/2011

## 2011-05-02 NOTE — ED Provider Notes (Signed)
History     CSN: [id number] Arrival date & time: 05/02/2011  8:20 AM   First MD Initiated Contact with Patient 05/02/11 [phone]      Chief Complaint  Patient presents with  . Chest Pain    (Consider location/radiation/quality/duration/timing/severity/associated sxs/prior treatment) HPI Comments: Patient is a 35 year old woman who says that she had shortness of breath for the past couple of days developed some chest pain last night. She feels her symptoms are getting worse. She has had similar symptoms before, caused by congestive heart failure, and has had evaluation for coronary artery disease, with a cardiac catheterization that revealed nonobstructive coronary artery disease and normal left ventricular function in June of 2012. She is diabetic, hypertensive.  She has end-stage renal disease, and is on hemodialysis Monday, Wednesday, and Friday. She had dialysis yesterday.  Patient is a 35 y.o. female presenting with shortness of breath.  Shortness of Breath  The current episode started 2 days ago. The problem has been gradually worsening. The problem is moderate. The symptoms are relieved by nothing. The symptoms are aggravated by nothing. Associated symptoms include chest pain, chest pressure and shortness of breath. She has had prior hospitalizations. She has had no prior ICU admissions. She has had no prior intubations. She has been behaving normally. Urine output has decreased. There were no sick contacts. Recently, medical care has been given by a specialist.    Past Medical History  Diagnosis Date  . DM (diabetes mellitus)     Long-term insulin  . Hypertension   . Chronic anemia     2nd to renal disease  . Tobacco use disorder     Discontinued March 2012  . Iron deficiency anemia   . Hyperlipidemia     Hypertriglyceridemia 449 HDL 25  . End stage renal disease on dialysis     On dialysis since March 2012 status post AV fistula  . Diabetic Charcot's joint disease   . Coronary  artery disease     Status post cardiac catheterization June 2012 scattered coronary artery disease/atherosclerosis with 70-80% stenosis in a small palpable right PDA lesion.  . 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 Gastroparesis   . Peripheral vascular disease     Tibial occlusive disease evaluated by Dr. Hart Rochester in August 2011. Medical therapy  . 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 CIN III (cervical intraepithelial neoplasia grade III) with severe dysplasia     S/P LEEP AND CONE    Past Surgical History  Procedure Date  . Dilation and curettage, diagnostic / therapeutic   . Av fistula repair 04/2010    Family History  Problem Relation Age of Onset  . Diabetes Mother   . Diabetes Father   . Diabetes Sister   . Breast cancer Maternal Aunt     History  Substance Use Topics  . Smoking status: Former Smoker -- 0.3 packs/day for 10 years    Types: Cigarettes    Quit date: 05/27/2008  . Smokeless tobacco: Never Used   Comment: Smokes several cigarettes a day (been smoking for 6 years)  . Alcohol Use: No    OB History    Grav Para Term Preterm Abortions TAB SAB Ect Mult Living   2 1   1     1       Review of Systems  Constitutional: Negative.   HENT: Negative.   Eyes: Negative.   Respiratory: Positive for shortness of breath.   Cardiovascular: Positive for chest pain.  Gastrointestinal:  Negative.   Genitourinary: Positive for menstrual problem.       She has minimal urination.  She has amenorrhea, for which she was seen yesterday by her gynecologist and was prescribed Provera.  Musculoskeletal: Negative.   Neurological: Negative.   Psychiatric/Behavioral: Negative.     Allergies  Cephalexin and Sulfamethoxazole w/trimethoprim  Home Medications   Current Outpatient Rx  Name Route Sig Dispense Refill  . AMLODIPINE BESYLATE 10 MG PO TABS Oral Take 10 mg by mouth daily.      . ATORVASTATIN CALCIUM 80 MG PO TABS Oral Take 80 mg by mouth daily.      Marland Kitchen CARVEDILOL 25 MG PO  TABS Oral Take 25 mg by mouth 2 (two) times daily.      Marland Kitchen CLONIDINE HCL 0.1 MG PO TABS Oral Take 0.1 mg by mouth 2 (two) times daily.      Marland Kitchen CLOPIDOGREL BISULFATE 75 MG PO TABS Oral Take 75 mg by mouth daily.      . INSULIN GLARGINE 100 UNIT/ML [place] SOLN Subcutaneous Inject 20 Units into the skin at bedtime.      . INSULIN LISPRO (HUMAN) 100 UNIT/ML [place] SOLN Subcutaneous Inject 6-8 Units into the skin 3 (three) times daily before meals.      . ISOSORBIDE MONONITRATE ER 120 MG PO TB24 Oral Take 120 mg by mouth daily.      Marland Kitchen LABETALOL HCL 200 MG PO TABS Oral Take 200 mg by mouth 2 (two) times daily.      Marland Kitchen RENA-VITE PO TABS Oral Take 1 tablet by mouth daily.      . TOPIRAMATE 25 MG PO TABS Oral Take 25 mg by mouth 2 (two) times daily.      . TORSEMIDE 20 MG PO TABS Oral Take 20 mg by mouth 2 (two) times daily.      Marland Kitchen MEDROXYPROGESTERONE ACETATE 10 MG PO TABS Oral Take 1 tablet (10 mg total) by mouth daily. 10 tablet 0  . NITROGLYCERIN 0.4 MG SL SUBL Sublingual Place 0.4 mg under the tongue every 5 (five) minutes as needed. For chest pain      BP 187/98  Pulse 92  Temp(Src) 98 F (36.7 C) (Oral)  Resp 22  SpO2 98%  LMP 04/25/2011  Physical Exam  Constitutional: She is oriented to person, place, and time. She appears well-developed and well-nourished.  HENT:  Head: Normocephalic and atraumatic.  Right Ear: External ear normal.  Left Ear: External ear normal.  Mouth/Throat: Oropharynx is clear and moist.  Eyes: Conjunctivae and EOM are normal. Pupils are equal, round, and reactive to light.  Neck: Normal range of motion. Neck supple.  Cardiovascular: Normal rate, regular rhythm and normal heart sounds.   Pulmonary/Chest: Effort normal and breath sounds normal. No respiratory distress. She has no wheezes. She has no rales.  Abdominal: Soft. Bowel sounds are normal.  Musculoskeletal: Normal range of motion.       No peripheral edema.  Neurological: She is alert and oriented to person,  place, and time.       No sensory or motor deficits.  Skin: Skin is warm and dry.  Psychiatric: She has a normal mood and affect. Her behavior is normal.    ED Course  Procedures (including critical care time   11:30 AM  Date: 05/02/2011  Rate:97  Rhythm: normal sinus rhythm  QRS Axis: left  Intervals: QT prolonged  ST/T Wave abnormalities: nonspecific ST/T changes  Conduction Disutrbances:none  Narrative Interpretation: Abnormal EKG  Old EKG Reviewed: unchanged  11:30 AM Patient was seen and had physical examination. Laboratory tests were performed. EKG was benign. Old charts were reviewed.  11:30 AM Lab results shows the patient has congestive heart failure. There is no evidence of heart attack. I have reviewed these results with the patient. Will consult nephrology to arrange dialysis for her.  11:30 AM Case was discussed with Casimiro Needle M.D. The nephrology service will see the patient and treat her.  1. Congestive heart failure   2. End stage renal disease on dialysis            Carleene Cooper III, MD 05/02/11 1130

## 2011-05-02 NOTE — ED Notes (Signed)
Admitting MD at bedside. Remains pain free. NAD.

## 2011-05-02 NOTE — ED Notes (Signed)
Pt placed on monitor with continuous blood pressure and pulse oximetry 

## 2011-05-02 NOTE — ED Notes (Signed)
Pt unable to make urine due to renal disfunction, UA cancelled

## 2011-05-02 NOTE — H&P (Signed)
History as articulated by Mr. Stark Klein, physical exam corroborated, and patient complains of shortness of breath and chest discomfort that has resolved. She is s/p neg cath last year.  Chronic headaches on hemodialysis.  Plan to dialyze and follow clinically.  We need to try to treat the headaches to ameliorate this on dialysis to prevent early sign offs.

## 2011-05-03 ENCOUNTER — Inpatient Hospital Stay (HOSPITAL_COMMUNITY): Payer: 59

## 2011-05-03 LAB — CBC
HCT: 31.1 % — ABNORMAL LOW (ref 36.0–46.0)
Hemoglobin: 10.3 g/dL — ABNORMAL LOW (ref 12.0–15.0)
MCH: 30.6 pg (ref 26.0–34.0)
MCHC: 33.1 g/dL (ref 30.0–36.0)
MCV: 92.3 fL (ref 78.0–100.0)
Platelets: 220 K/uL (ref 150–400)
RBC: 3.37 MIL/uL — ABNORMAL LOW (ref 3.87–5.11)
RDW: 12.9 % (ref 11.5–15.5)
WBC: 8.3 K/uL (ref 4.0–10.5)

## 2011-05-03 LAB — GLUCOSE, CAPILLARY
Glucose-Capillary: 501 mg/dL — ABNORMAL HIGH (ref 70–99)
Glucose-Capillary: 166 mg/dL — ABNORMAL HIGH (ref 70–99)

## 2011-05-03 LAB — RENAL FUNCTION PANEL
BUN: 33 mg/dL — ABNORMAL HIGH (ref 6–23)
CO2: 26 mEq/L (ref 19–32)
Calcium: 8.3 mg/dL — ABNORMAL LOW (ref 8.4–10.5)
Glucose, Bld: 428 mg/dL — ABNORMAL HIGH (ref 70–99)
Phosphorus: 5.7 mg/dL — ABNORMAL HIGH (ref 2.3–4.6)

## 2011-05-03 LAB — GLUCOSE, RANDOM: Glucose, Bld: 550 mg/dL — ABNORMAL HIGH (ref 70–99)

## 2011-05-03 MED ORDER — PARICALCITOL 5 MCG/ML IV SOLN
INTRAVENOUS | Status: AC
  Filled 2011-05-03: qty 2

## 2011-05-03 MED ORDER — INSULIN GLARGINE 100 UNIT/ML ~~LOC~~ SOLN: 32.0000 [IU] | Freq: Every day | SUBCUTANEOUS | Status: DC

## 2011-05-03 MED ORDER — INSULIN ASPART 100 UNIT/ML ~~LOC~~ SOLN
0.0000 [IU] | Freq: Three times a day (TID) | SUBCUTANEOUS | Status: DC
  Administered 2011-05-03 (×2): 9 [IU] via SUBCUTANEOUS
  Filled 2011-05-03: qty 3

## 2011-05-03 MED ORDER — PARICALCITOL 5 MCG/ML IV SOLN
8.0000 ug | INTRAVENOUS | Status: DC
  Administered 2011-05-03: 8 ug via INTRAVENOUS
  Filled 2011-05-03: qty 1.6

## 2011-05-03 MED ORDER — CLONIDINE HCL 0.1 MG PO TABS
0.1000 mg | ORAL_TABLET | Freq: Once | ORAL | Status: AC
  Administered 2011-05-03: 0.1 mg via ORAL
  Filled 2011-05-03: qty 1

## 2011-05-03 MED ORDER — CALCIUM ACETATE 667 MG PO CAPS: 2001.0000 mg | ORAL_CAPSULE | Freq: Three times a day (TID) | ORAL | Status: DC

## 2011-05-03 MED ORDER — DARBEPOETIN ALFA-POLYSORBATE 60 MCG/0.3ML IJ SOLN
60.0000 ug | INTRAMUSCULAR | Status: DC
  Administered 2011-05-03: 60 ug via INTRAVENOUS
  Filled 2011-05-03: qty 0.3

## 2011-05-03 MED ORDER — DARBEPOETIN ALFA-POLYSORBATE 60 MCG/0.3ML IJ SOLN
INTRAMUSCULAR | Status: AC
  Filled 2011-05-03: qty 0.3

## 2011-05-03 MED ORDER — DARBEPOETIN ALFA-POLYSORBATE 60 MCG/0.3ML IJ SOLN: 60.0000 ug | INTRAMUSCULAR | Status: DC

## 2011-05-03 NOTE — Progress Notes (Signed)
Hyperglycemia- Nursing staff reports glucose now 501; since she didn't get her normal dose of Lantus and ate a regular breakfast this am; we will continue current SSI and have staff inform if remians > 300 and extra insulin required with 1 hr post coverage CBG.

## 2011-05-03 NOTE — Discharge Summary (Signed)
Physician Discharge Summary  Patient ID: [name] MRN: [id number] DOB/AGE: 1975/11/18 35 y.o.  Admit date: 05/02/2011 Discharge date: 05/03/2011  Discharge Diagnoses:  Active Problems:  * No active hospital problems. *    Discharged Condition: stable  Hospital Course:  This is a 35 y.o. female with ESRD Presents to ER after missing her hemodialysis treatment on Wednesday of this week because she "felt too sick". She became short of breath and had progressively sharp left sided chest pain radiating into left shoulder that was resolved spontaneously at the time of admission. She did have elevated glucose levels prior to discharge and changed to a carb modified diet, was given sliding scale insulin coverage with meals and will resume her outpatient dose of Lantus this evening. This can be followed up in the outpatient setting as she is asymptomatic at the time of discharge. She underwent hemodialysis on the date of admission and again prior to discharge with significant improvement in her respiratory status and therefore, will be discharged to resume her outpatient schedule with her next treatment scheduled for Monday 05/06/11.    Treatments: dialysis: Hemodialysis Blood pressure 189/114, pulse 74, temperature 97.1 F (36.2 C), temperature source Oral, resp. rate 16, height 6\' 4"  (1.93 m), weight 103.5 kg (228 lb 2.8 oz), last menstrual period 04/25/2011, SpO2 100.00%.  Disposition: Home or Self Care  Discharge Orders    Future Appointments: Provider: Department: Dept Phone: Center:   05/09/2011 9:30 AM Ardeth Sportsman, MD [facility] [phone] None     Current Discharge Medication List    CONTINUE these medications which have CHANGED   Details  insulin glargine (LANTUS) 100 UNIT/ML injection Inject 32 Units into the skin at bedtime. Patient reports taking 32units at home and instructed to resume Qty: 10 mL, Refills: 0      CONTINUE these medications which have NOT CHANGED    Details  amLODipine (NORVASC) 10 MG tablet Take 10 mg by mouth daily.      atorvastatin (LIPITOR) 80 MG tablet Take 80 mg by mouth daily.      carvedilol (COREG) 25 MG tablet Take 25 mg by mouth 2 (two) times daily.      cloNIDine (CATAPRES) 0.1 MG tablet Take 0.1 mg by mouth 2 (two) times daily.      clopidogrel (PLAVIX) 75 MG tablet Take 75 mg by mouth daily.      insulin lispro (HUMALOG) 100 UNIT/ML injection Inject 6-8 Units into the skin 3 (three) times daily before meals.      isosorbide mononitrate (IMDUR) 120 MG 24 hr tablet Take 120 mg by mouth daily.      labetalol (NORMODYNE) 200 MG tablet Take 200 mg by mouth 2 (two) times daily.      multivitamin (RENA-VIT) TABS tablet Take 1 tablet by mouth daily.      topiramate (TOPAMAX) 25 MG tablet Take 25 mg by mouth 2 (two) times daily.      torsemide (DEMADEX) 20 MG tablet Take 20 mg by mouth 2 (two) times daily.      medroxyPROGESTERone (PROVERA) 10 MG tablet Take 1 tablet (10 mg total) by mouth daily. Qty: 10 tablet, Refills: 0    nitroGLYCERIN (NITROSTAT) 0.4 MG SL tablet Place 0.4 mg under the tongue every 5 (five) minutes as needed. For chest pain       Follow-up Information    Follow up with VYAS,DHRUV B., MD .         Signed: Samuel Germany, FNP-C  [facility] Pager [phone] 05/03/2011, 5:38 PM  Attending Physician: Dr. Casimiro Needle  [facility]

## 2011-05-03 NOTE — Progress Notes (Signed)
Subjective:  Feeling better but still SOB  Vital signs in last 24 hours: Filed Vitals:   05/02/11 1722 05/02/11 1813 05/02/11 2148 05/03/11 0617  BP: 195/119 178/109 193/107 184/91  Pulse: 81 79 81 77  Temp: 97.2 F (36.2 C) 98.4 F (36.9 C) 97.7 F (36.5 C) 98.5 F (36.9 C)  TempSrc: Oral Oral Oral Oral  Resp: 24 20 19 19   Height:  6\' 4"  (1.93 m)    Weight: 102.6 kg (226 lb 3.1 oz) 102.6 kg (226 lb 3.1 oz) 102.8 kg (226 lb 10.1 oz)   SpO2: 97% 96% 97% 95%   Weight change:   Intake/Output Summary (Last 24 hours) at 05/03/11 0913 Last data filed at 05/02/11 1900  Gross per 24 hour  Intake      0 ml  Output   3000 ml  Net  -3000 ml   Labs: Basic Metabolic Panel:  Lab 05/02/11 1610  NA 139  K 3.6  CL 104  CO2 20  GLUCOSE 110*  BUN 46*  CREATININE 5.56*  CALCIUM 8.2*  ALB --  PHOS --   Liver Function Tests:  Lab 05/02/11 0845  AST 8  ALT 7  ALKPHOS 64  BILITOT 0.2*  PROT 6.6  ALBUMIN 2.6*   No results found for this basename: LIPASE:3,AMYLASE:3 in the last 168 hours No results found for this basename: AMMONIA:3 in the last 168 hours CBC:  Lab 05/02/11 0845  WBC 9.1  NEUTROABS 6.7  HGB 10.4*  HCT 31.5*  MCV 91.0  PLT 212   Cardiac Enzymes:  Lab 05/02/11 0845  CKTOTAL 60  CKMB 3.4  CKMBINDEX --  TROPONINI <0.30   CBG: No results found for this basename: GLUCAP:5 in the last 168 hours  Iron Studies: No results found for this basename: IRON,TIBC,TRANSFERRIN,FERRITIN in the last 72 hours Studies/Results: Dg Chest Port 1 View  05/02/2011  *RADIOLOGY REPORT*  Clinical Data: Shortness of breath, chest pain, end-stage renal disease on dialysis.  PORTABLE CHEST - 1 VIEW  Comparison: Portable chest x-ray of 11/12/2010  Findings: There is moderate pulmonary vascular congestion present. Mild interstitial edema is a consideration.  Cardiomegaly is again noted.  No definite effusion is seen.  No bony abnormality is seen.  IMPRESSION: Moderate cardiomegaly  with pulmonary vascular congestion.  Original Report Authenticated By: Juline Patch, M.D.   Medications:      . amLODipine  10 mg Oral QHS  . amLODipine  10 mg Oral Daily  . calcium acetate  2,001 mg Oral TID WC  . carvedilol  25 mg Oral BID WC  . cloNIDine  0.1 mg Oral BID  . clopidogrel  75 mg Oral Q breakfast  . darbepoetin (ARANESP) injection - DIALYSIS  60 mcg Intravenous Q T,Th,Sa-HD  . insulin aspart  0-9 Units Subcutaneous TID WC  . insulin glargine  20 Units Subcutaneous QHS  . isosorbide mononitrate  120 mg Oral Daily  . labetalol  200 mg Oral BID  . multivitamin  1 tablet Oral Daily  . paricalcitol  8 mcg Intravenous Q T,Th,Sa-HD  . rosuvastatin  20 mg Oral Daily  . DISCONTD: carvedilol  25 mg Oral BID  . DISCONTD: cloNIDine  0.1 mg Oral BID  . DISCONTD: clopidogrel  75 mg Oral Daily  . DISCONTD: insulin glargine  22 Units Subcutaneous QHS  . DISCONTD: isosorbide dinitrate  30 mg Oral TID  . DISCONTD: multivitamin  1 tablet Oral Daily    I  have reviewed scheduled and  prn medications.  Physical Exam: General: comfortable Heart: RRR Lungs: diminished w/very faint rales r base Abdomen: soft, NT/ND, + BS Extremities: no ankle edema Dialysis Access: LFA AVF +T/B  Problem/Plan: 1. C/P- resolved  2. Volume overload- missed HD on Wed; resp status improving following HD yesterday on adm; still SOB and scheduled again today 3. ESRD - MWF NW; HD today 4. Hypokalemia- due to elevated glucose this am, will not replete with HD until confirmed with pre-tx labs 5. Anemia - same outpatient epogen and follow trend 6. Secondary hyperparathyroidism - same, follow ca/phos 7. HTN/volume -elevated; same outpatient meds; max UF with HD and re-eval following tx today 8. DM- CBG 447 this am; add CARB MOD Diet; Increase Lantus to outpatient 32 unit dose (patient report); cont CBG ac/hs with SSI and follow    9. Medical Noncompliance-encouraged compliance with treatments to prevent  recurrent adm for the same  Samuel Germany, FNP-C [facility] Pager [phone]  05/03/2011,9:13 AM  LOS: 1 day

## 2011-05-03 NOTE — Progress Notes (Signed)
She is better. Denies shortness of breath. We plan dialysis and then discharge.  Try for a lower EDW.

## 2011-05-09 ENCOUNTER — Ambulatory Visit (INDEPENDENT_AMBULATORY_CARE_PROVIDER_SITE_OTHER): Payer: 59 | Admitting: Surgery

## 2011-05-09 ENCOUNTER — Encounter (INDEPENDENT_AMBULATORY_CARE_PROVIDER_SITE_OTHER): Payer: Self-pay | Admitting: Surgery

## 2011-05-09 VITALS — BP 182/100 | HR 76 | Temp 97.7°F | Resp 16 | Ht 76.0 in | Wt 238.6 lb

## 2011-05-09 DIAGNOSIS — Z872 Personal history of diseases of the skin and subcutaneous tissue: Secondary | ICD-10-CM

## 2011-05-09 DIAGNOSIS — Z9119 Patient's noncompliance with other medical treatment and regimen: Secondary | ICD-10-CM | POA: Insufficient documentation

## 2011-05-09 DIAGNOSIS — N390 Urinary tract infection, site not specified: Secondary | ICD-10-CM

## 2011-05-09 DIAGNOSIS — I2510 Atherosclerotic heart disease of native coronary artery without angina pectoris: Secondary | ICD-10-CM

## 2011-05-09 DIAGNOSIS — N186 End stage renal disease: Secondary | ICD-10-CM

## 2011-05-09 NOTE — Progress Notes (Signed)
Subjective:     Patient ID: [name], female   DOB: 10/22/1975, 35 y.o.   MRN: [id number]  HPI  [name]  [birth year] [id number]  Patient Care Team: Ignatius Specking, MD as PCP - General (Internal Medicine) Peyton Bottoms, MD as Consulting Physician (Cardiology) Sadie Haber, MD as Consulting Physician (Nephrology)  This patient is a 35 y.o.female who presents today for surgical evaluation at the request of Dr. Eliott Nine.    Reason for visit: Consideration of placement of peritoneal dialysis catheter.  The patient is a insulin-dependent diabetic type I. She has been diagnosed for several decades. She's progressed to kidney failure. She still requires aggressive insulin coverage.  I do not think she has an endocrinologist.   She is dialysis dependent. She has had problems with tolerance of hemodialysis (qMWF via L forearm fistula created at [facility]) secondary to severe headaches, changes in blood pressure, etc. She has occasionally stopped dialysis and then run into problems with fluid overload. She's had aggressive workup to try and control her headaches and other health issues. She's been at numerous different places including [facility] and [facility]. She relocated to this region and has been managed by [facility].  She was sent by Dr. Eliott Nine for consideration up lateness of placement of apparent analysis for different way to do with her dialysis. Had any abdominal surgeries before. She can walk a few blocks to a half mile. She has to stop secondary to leg pain. She does not have an endocrinologist. Her last hemoglobin A1c was 9.  She has issues with skin infections. She usually has 2-6 a year depending when you ask her. Most recently we did incision and drainage in our office in July. I don't know if she's had MRSA. She notes she's had numerous infections before.  Patient Active Problem List  Diagnoses  . IDDM  . RBC HYPOCHROMIA  . UNSPECIFIED ANEMIA  .  NONDEPENDENT TOBACCO USE DISORDER  . ESSENTIAL HYPERTENSION, BENIGN  . NEPHROTIC SYNDROME  . SHORTNESS OF BREATH  . CHEST PAIN UNSPECIFIED  . PRECORDIAL PAIN  . End stage renal disease on dialysis  . Coronary artery disease  . Hyperlipidemia  . History of abscesses in groins  . CIN III (cervical intraepithelial neoplasia grade III) with severe dysplasia  . History of noncompliance with medical treatment    Past Medical History  Diagnosis Date  . DM (diabetes mellitus)     Long-term insulin  . Hypertension   . Tobacco use disorder     Discontinued March 2012  . Hyperlipidemia     Hypertriglyceridemia 449 HDL 25  . Diabetic Charcot's joint disease   . Coronary artery disease     Status post cardiac catheterization June 2012 scattered coronary artery disease/atherosclerosis with 70-80% stenosis in a small palpable right PDA lesion.  . Gastroparesis   . Peripheral vascular disease     Tibial occlusive disease evaluated by Dr. Hart Rochester in August 2011. Medical therapy  . 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 CIN III (cervical intraepithelial neoplasia grade III) with severe dysplasia     S/P LEEP AND CONE  . Angina   . Chronic anemia     2nd to renal disease  . Iron deficiency anemia   . End stage renal disease on dialysis     On dialysis since March 2012  . End stage renal disease on dialysis 05/02/11    [facility]; M; W, F; last time 05/01/11  . Migraines     "  just on dialysis days"  . Peripheral neuropathy     related to DM  . 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 CHF (congestive heart failure)   . Shortness of breath 05/02/11    "resting"    Past Surgical History  Procedure Date  . Av fistula placement 04/2010  . Dilation and curettage of uterus 2009  . Cervical biopsy  w/ loop electrode excision     h/o  . Cervical cone biopsy     h/o    History   Social History  . Marital Status: Married    Spouse Name: [name]    Number of Children: [name]  . Years of Education: [name]     Occupational History  .  [employer]   Social History Main Topics  . Smoking status: Former Smoker -- 0.3 packs/day for 10 years    Types: Cigarettes    Quit date: 05/27/2008  . Smokeless tobacco: Never Used   Comment: "quit smoking cigarettes 07/2010"  . Alcohol Use: No  . Drug Use: No  . Sexually Active: Yes    Birth Control/ Protection: Condom   Other Topics Concern  . Not on file   Social History Narrative  . No narrative on file    Family History  Problem Relation Age of Onset  . Diabetes Mother   . Diabetes Father   . Diabetes Sister   . Breast cancer Maternal Aunt   . Cancer Maternal Aunt     breast    Current outpatient prescriptions:amLODipine (NORVASC) 10 MG tablet, Take 10 mg by mouth daily.  , Disp: , Rfl: ;  atorvastatin (LIPITOR) 80 MG tablet, Take 80 mg by mouth daily.  , Disp: , Rfl: ;  calcium acetate (PHOSLO) 667 MG capsule, Take 3 capsules (2,001 mg total) by mouth 3 (three) times daily with meals., Disp: 2001 capsule, Rfl: 0;  carvedilol (COREG) 25 MG tablet, Take 25 mg by mouth 2 (two) times daily.  , Disp: , Rfl:  cloNIDine (CATAPRES) 0.1 MG tablet, Take 0.1 mg by mouth 2 (two) times daily.  , Disp: , Rfl: ;  clopidogrel (PLAVIX) 75 MG tablet, Take 75 mg by mouth daily.  , Disp: , Rfl: ;  insulin glargine (LANTUS) 100 UNIT/ML injection, Inject 32 Units into the skin at bedtime. Patient reports taking 32units at home and instructed to resume, Disp: 10 mL, Rfl: 0 insulin lispro (HUMALOG) 100 UNIT/ML injection, Inject 6-8 Units into the skin 3 (three) times daily before meals.  , Disp: , Rfl: ;  isosorbide mononitrate (IMDUR) 120 MG 24 hr tablet, Take 120 mg by mouth daily.  , Disp: , Rfl: ;  labetalol (NORMODYNE) 200 MG tablet, Take 200 mg by mouth 2 (two) times daily.  , Disp: , Rfl: ;  medroxyPROGESTERone (PROVERA) 10 MG tablet, Take 1 tablet (10 mg total) by mouth daily., Disp: 10 tablet, Rfl: 0 multivitamin (RENA-VIT) TABS tablet, Take 1 tablet by mouth daily.   , Disp: , Rfl: ;  nitroGLYCERIN (NITROSTAT) 0.4 MG SL tablet, Place 0.4 mg under the tongue every 5 (five) minutes as needed. For chest pain, Disp: , Rfl: ;  topiramate (TOPAMAX) 25 MG tablet, Take 25 mg by mouth 2 (two) times daily.  , Disp: , Rfl: ;  torsemide (DEMADEX) 20 MG tablet, Take 20 mg by mouth 2 (two) times daily.  , Disp: , Rfl:   Allergies  Allergen Reactions  . Cephalexin     REACTION: ?dz at young  age  . Sulfamethoxazole W/Trimethoprim     REACTION: thrush    BP 182/100  Pulse 76  Temp(Src) 97.7 F (36.5 C) (Temporal)  Resp 16  Ht 6\' 4"  (1.93 m)  Wt 238 lb 9.6 oz (108.228 kg)  BMI 29.04 kg/m2  LMP 04/25/2011     Review of Systems  Constitutional: Negative for fever, chills, diaphoresis, appetite change and fatigue.  HENT: Negative for ear pain, sore throat, trouble swallowing, neck pain and ear discharge.   Eyes: Negative for photophobia, discharge and visual disturbance.  Respiratory: Negative for cough, choking, chest tightness and shortness of breath.   Cardiovascular: Negative for chest pain and palpitations.  Gastrointestinal: Negative for nausea, vomiting, abdominal pain, diarrhea, constipation, anal bleeding and rectal pain.  Genitourinary: Negative for dysuria, frequency and difficulty urinating.  Musculoskeletal: Negative for myalgias and gait problem.  Skin: Negative for color change, pallor and rash.  Neurological: Negative for dizziness, speech difficulty, weakness and numbness.  Hematological: Negative for adenopathy.  Psychiatric/Behavioral: Negative for confusion and agitation. The patient is not nervous/anxious.        Objective:   Physical Exam  Constitutional: She is oriented to person, place, and time. She appears well-developed and well-nourished. No distress.  HENT:  Head: Normocephalic.  Mouth/Throat: Oropharynx is clear and moist. No oropharyngeal exudate.  Eyes: Conjunctivae and EOM are normal. Pupils are equal, round, and reactive  to light. No scleral icterus.  Neck: Normal range of motion. Neck supple. No tracheal deviation present.  Cardiovascular: Normal rate, regular rhythm and intact distal pulses.   Pulmonary/Chest: Effort normal and breath sounds normal. No respiratory distress. She exhibits no tenderness.  Abdominal: Soft. She exhibits no distension and no mass. There is no tenderness. There is no rebound and no guarding. Hernia confirmed negative in the right inguinal area and confirmed negative in the left inguinal area.       No hernias.  Mild ecchymosis  Genitourinary: No vaginal discharge found.  Musculoskeletal: Normal range of motion. She exhibits no tenderness.  Lymphadenopathy:    She has no cervical adenopathy.       Right: No inguinal adenopathy present.       Left: No inguinal adenopathy present.  Neurological: She is alert and oriented to person, place, and time. No cranial nerve deficit. She exhibits normal muscle tone. Coordination normal.  Skin: Skin is warm and dry. No rash noted. She is not diaphoretic. No erythema.       9mm mole on R mons pubis ("It's been there all my life")  Psychiatric: She has a normal mood and affect. Her behavior is normal. Judgment and thought content normal.       Assessment:     CKD progressed to ESRD with poor tolerance to HD, but also with poorly controlled DM & recurrent skin infections    Plan:     Unfortunately, this is a very challenging situation. I did discuss the procedure of placement with her:   The anatomy & physiology of peritoneum was discussed.  Natural history risks without surgery of worsening renal failure was discussed.   I feel the risks of no intervention will lead to serious problems that outweigh the operative risks; therefore, I discussed placement of a peritoneal dialysis catheter.  I explained laparoscopic techniques with possible need for an open approach.    Risks such as bleeding, infection, abscess, injury to other organs, catheter  occlusion or malpositioning, reoperation to remove/reposition the catheter, heart attack, death, and other risks were  discussed.   I noted a good likelihood this will help address the problem.  Possibility that this will not be enough to compensate for the renal failure & need for further treatment such as hemodialysis was explained.  Goals of post-operative recovery were discussed as well.  We would work to minimize complications.  The patient is/will be getting training on catheter use by dialysis nursing before and after surgery.  I stressed the importance of meticulous care & sterile technique to prevent catheter problems.  Questions were answered.  The patient expresses understanding.  With her numerous recurrent skin infections, I think she is a poor candidate for placement of a peroneal dialysis catheter. I worried that her recurrent infections we'll check the catheter and failed it. She could end up with chronic peritonitis or much worse conditions. I suspect this is due to your poorly controlled diabetes. She has had glc 400-500 in ESRD & on insulin (!).  I think if she could get her diabetes under better control and have a period of without infections for 6-12 months, this option of CAPD catheter placement can be revisited. From a technical standpoint it would be not difficult to place the catheter in.  I did call the pt's nephrologist, Dr. Eliott Nine, & discussed this with her. I was not aware of her poor difficulties with hemodialysis. She was not aware of all the skin infection issues. This is a challenging patient again, especially since she's not very forthcoming of her history and it takes a while to piece it altogether.  While her hemodialysis problems are at the very least above average, I worry I could make her worse until we can prove that her infection risk is markedly decreased. I would strongly recommend an endocrinology evaluation to get her glucose until better control.

## 2011-05-09 NOTE — Patient Instructions (Signed)
Peritoneal Dialysis Dialysis can be done using a machine outside of the body (hemodialysis). Or, it can be done inside the body (peritoneal dialysis). The word "peritoneal" refers to the lining or membrane of the belly (abdominal cavity). The peritoneal membrane is a thin, plastic-like lining inside the belly that covers the organs and fits in the abdominal or peritoneal cavity, such as the stomach, liver and the kidneys. This lining works like a filter. It will allow certain things to pass from your blood through the lining and into a special solution that has been placed into your belly. In this type of dialysis, the peritoneum is used to help clean the blood.  If you need dialysis, your kidneys are not working right. Healthy kidneys take out extra water and waste products, which becomes urine. When the kidneys do not do this, serious problems can develop. The waste and water build up in the blood. Your hands and feet might swell. You may feel tired, weak or sick to your stomach. Also, your blood pressure may rise. If not treated, you could die. Dialysis is a treatment that does the work that your kidneys would do if they were healthy.  It cleans your blood.   It will make sure your body has the right amount of certain chemicals that it needs. They include potassium, sodium and bicarbonate.   It will help control your blood pressure.  UNDERSTANDING PERITONEAL DIALYSIS  Here is how peritoneal dialysis works:   First, you will have surgery to put a soft plastic tube (catheter) into your belly (abdomen). This will allow you to easily connect yourself to special tubing, which will then let a special dialysis solution to be placed into your abdomen.   For each treatment, you will need at least one bag of dialysis solution (a liquid called dialysate). It is a mix of water that is pure and free of germs (sterile), sugar (dextrose) and the nutrients and minerals found in your blood. Sometimes, more than one  bag is needed to get the right amount of fluid for your abdomen. Your caregiver will explain what size and how many bags you will need.   The dialysate is slowly put through the catheter to fill the abdomen (called the peritoneal cavity). This dialysate will need to stay in your body for 3-4 hours. This is known as the dwell time.   The solution is working to clean the blood and remove wastes from your body. At the end of this time, the solution is drained from your body through tubing into an empty bag. It is then replaced with a fresh dialysate.   The draining and replacing of the dialysate is called an exchange or cycle. The catheter is capped after each exchange. Once the solution is in your body, you are then free to do whatever you would like until the next exchange. Most people will need to do 4-5 exchanges each day.   There are two different methods that can be used.   Continuous ambulatory peritoneal dialysis (CAPD): You put the solution into your abdomen, cap your catheter and then go about your day. Several hours later, you reconnect to a tubing set up, drain out the solution and then put more solution in. This is done several times a day. No machine is needed.   Continuous cycler-assisted peritoneal dialysis (CCPD): A machine is used, which fills the abdomen with dialysate and then drains it. This happens several times. It usually is done at night while you   are sleeping. When you wake up, you can disconnect from the machine and are free to go to go about your day.  PREPARING FOR EXCHANGES  Discuss the details of the procedure with your caregivers. You will be working with a nurse who is specially trained in doing dialysis. Make sure you understand:   How to do an exchange.   How much solution you need.   What type of solution you will need.   How often you should do an exchange. Ask:   How many times each day?   When? At meals? At bedtime?   Always keep the dialysate bags and  other supplies in a cool, clean and dry place.   Keeping everything clean is very important.   The catheter and its cap must be free from germs (sterile)   The adapter also must be sterile. It attaches the dialysis bag and tubing to the catheter.   Clean the area of your body around the catheter every day. Use a chemical that fights infection (antiseptic).   Wash your hands thoroughly before starting an exchange.   You may be taught to wear a mask to cover your nose and mouth. This makes infection less likely to happen.   You may be taught to close doors, windows and turn off any fans before doing an exchange.   Check the dialysate bag very carefully.   Make sure it is the right size bag for you. This information is on the label.   Also, make sure it is the right mixture. For some people, the dialysate contents vary. For instance, the mixture might be a stronger solution for overnight.   Check the expiration date (the last date you can use the bag). It also is on the label. If the date has gone by, throw away the bag.   The solution should be clear. You should be able to see any writing on the side of the bag clearly through the solution. Do not use a cloudy solution.   Gently squeeze the bag to make sure there are no leaks.   Use a dry heating pad to warm the dialysate in the bag. Leave the cover on the bag while you do this.   This is for comfort. You can skip this step if you want.   Never place the bag of solution under warm or hot water. Water from a faucet is not sterile and could cause germs to get into the bag. Infection could then result.  PERFORMING AN EXCHANGE  For continuous ambulatory dialysis:   Attach the dialysis bag and tubing to your catheter. Hang the bag so that gravity (the natural downward pull) draws the solution down and into your abdomen once the clamps are opened. This should take about 10 minutes.   Remove the bag and tubing from the catheter. Cap the  catheter.   The solution stays in the abdomen for 3-4 hours (dwell time). The solution is working to clean the blood and remove wastes from your body.   When you are ready to drain the solution for another exchange, take the cap off the catheter. Then, attach the catheter to tubing, which is attached to an empty bag. Place this empty bag below the abdomen or on the floor or stool and undo the clamps.   Gravity helps pull the fluid out of the abdomen and into the bag. The fluid in the bag may look yellow and clear, like urine. It usually takes about 20   minutes to drain the fluid out of the abdomen.   When the solution has drained, start the process again by infusing a new bag of dialysate and then capping the catheter.   This should continue until you have used all of the solution that you are to use each day.   Sometimes, a small machine is used overnight. It is called a mini-cycler. This is done if the body cannot go all night without an exchange. The machine lets you sleep without having to get up and do an exchange.   For continuous cycler-assisted dialysis:   You will be taught how to set up or program your machine.   When you are ready for bed, put the dialysate bags onto the cycler machine. Put on exactly the number of bags that your caregiver said to use.   Connect your catheter to the machine and turn the cycler machine on.   Overnight, the cycler will do several exchanges. It often does three to five, sometimes more.   Solution that is in your abdomen in the morning will stay during the day. The machine is set to make the daytime solution stronger, if that is needed.   In the morning, you will disconnect from the machine and cap your catheter and go about your day.   Sometimes, an extra exchange is done during the day. This may be needed to remove excess waste or fluid.  IMPORTANT REMINDERS  You will need to follow a very strict schedule. Every step of the dialysis procedure  must be done every day. Sometimes, several times a day. Altogether, this might take an extra 2 hours or more. However, you must stick to the routine. Do not skip a day. Do not skip a procedure.   Some people find it helpful to work with a counselor or social worker in addition to the renal (kidney) nurse. They can help you figure out how to change your daily routine to fit in the dialysis sessions.   You may need to change your diet. Ask your caregiver for advice, or talk with a nutritionist about what you should and should not eat.   You will need to weigh yourself every day and keep track of what your weight is.   You may be taught how to check your blood pressure before every exchange. Your blood pressure reading will help determine what type of solution to use. If your blood pressure is too high, you may need a stronger solution.  RISKS AND COMPLICATIONS  Possible problems vary, depending on the method you use. Your overall health also can have an effect. Problems that could develop because of dialysis include:  Infection. This is the most common problem. It could occur:   In the peritoneum. This is called peritonitis.   Around the catheter.   Weight gain. The dialysate contains a type of sugar known as dextrose. Dextrose has a lot of calories. The body takes in several hundred calories from this sugar each day.   Weakened muscles in the abdomen. This can result from all of the fluid that your body has to hold in the abdomen.   Catheter replacement. Sometimes, a new one has to be put in.   Change in dialysis method. Due to some complications, you may need to change to hemodialysis for a short time and have your dialysis done at a center.   Trouble adjusting to your new lifestyle. In some people, this leads to depression.   Sleep problems.     Dialysis-related amyloidosis. This sometimes occurs after 5 years of dialysis. Protein builds up in the blood. This can cause painful deposits  on bones, joints and tendons (which connect muscle to bone). Or, it can cause hollow spots in bones that make them more likely to break.   Excess fluid. Your body may absorb too much of the fluid that is held in the abdomen. This can lead to heart or lung problems.  SEEK MEDICAL CARE IF:   You have any problems with an exchange.   The area around the catheter becomes red or painful.   The catheter seems loose, or it feels like it is coming out.   A bag of dialysate looks cloudy. Or, the liquid is an unusual color.   Abdominal pain or discomfort.   You feel sick to your stomach (nauseous) or throw up (vomit).   You develop a fever of more than 102 F (38.9 C).  SEEK IMMEDIATE MEDICAL CARE IF:  You develop a fever of more than 102 F (38.9 C). Document Released: 03/10/2009 Document Revised: 01/23/2011 Document Reviewed: 03/10/2009 [employer] Patient Information 2012 [employer]. 

## 2011-05-13 ENCOUNTER — Other Ambulatory Visit: Payer: Self-pay | Admitting: *Deleted

## 2011-05-13 MED ORDER — CLOPIDOGREL BISULFATE 75 MG PO TABS: 75.0000 mg | ORAL_TABLET | Freq: Every day | ORAL | Status: DC

## 2011-05-24 NOTE — Progress Notes (Signed)
[name] C  

## 2011-05-29 ENCOUNTER — Ambulatory Visit: Payer: 59 | Admitting: Physician Assistant

## 2011-06-12 ENCOUNTER — Encounter (INDEPENDENT_AMBULATORY_CARE_PROVIDER_SITE_OTHER): Payer: Self-pay

## 2011-06-13 ENCOUNTER — Encounter: Payer: Self-pay | Admitting: Physician Assistant

## 2011-06-13 ENCOUNTER — Ambulatory Visit (INDEPENDENT_AMBULATORY_CARE_PROVIDER_SITE_OTHER): Payer: 59 | Admitting: Physician Assistant

## 2011-06-13 DIAGNOSIS — Z79899 Other long term (current) drug therapy: Secondary | ICD-10-CM

## 2011-06-13 DIAGNOSIS — E785 Hyperlipidemia, unspecified: Secondary | ICD-10-CM

## 2011-06-13 DIAGNOSIS — E109 Type 1 diabetes mellitus without complications: Secondary | ICD-10-CM

## 2011-06-13 DIAGNOSIS — I10 Essential (primary) hypertension: Secondary | ICD-10-CM

## 2011-06-13 DIAGNOSIS — I2510 Atherosclerotic heart disease of native coronary artery without angina pectoris: Secondary | ICD-10-CM

## 2011-06-13 NOTE — Assessment & Plan Note (Signed)
We'll order a fasting lipid profile for reassessment of lipid status. Target LDL 70 or less, if feasible. Previous LDL level of 130, June 2011, with HDL 25. Continue high dose Lipitor, pending further review.

## 2011-06-13 NOTE — Assessment & Plan Note (Signed)
Well-controlled on current regimen. ?

## 2011-06-13 NOTE — Progress Notes (Signed)
HPI: Patient presents for 6 month followup.  Since last seen here in clinic in June 2012, by Dr. Andee Lineman, patient reports no interim development of symptoms suggestive of UAP or decompensated heart failure. Of note, she was briefly hospitalized in early December, at [facility], for mild volume overload with associated SOB, after missing one dialysis treatment.  Allergies  Allergen Reactions  . Cephalexin     REACTION: ?dz at young age  . Sulfamethoxazole W/Trimethoprim     REACTION: thrush    Current Outpatient Prescriptions  Medication Sig Dispense Refill  . amLODipine (NORVASC) 10 MG tablet Take 10 mg by mouth daily.        Marland Kitchen atorvastatin (LIPITOR) 80 MG tablet Take 80 mg by mouth daily.        . calcium acetate (PHOSLO) 667 MG capsule Take 3 capsules (2,001 mg total) by mouth 3 (three) times daily with meals.  2001 capsule  0  . carvedilol (COREG) 25 MG tablet Take 25 mg by mouth 2 (two) times daily.        . cloNIDine (CATAPRES) 0.1 MG tablet Take 0.1 mg by mouth 2 (two) times daily.        . clopidogrel (PLAVIX) 75 MG tablet Take 1 tablet (75 mg total) by mouth daily.  30 tablet  6  . insulin glargine (LANTUS) 100 UNIT/ML injection Inject 32 Units into the skin at bedtime. Patient reports taking 32units at home and instructed to resume  10 mL  0  . insulin lispro (HUMALOG) 100 UNIT/ML injection Inject 6-8 Units into the skin 3 (three) times daily before meals.        . isosorbide mononitrate (IMDUR) 120 MG 24 hr tablet Take 120 mg by mouth daily.        Marland Kitchen labetalol (NORMODYNE) 200 MG tablet Take 200 mg by mouth 2 (two) times daily.        . multivitamin (RENA-VIT) TABS tablet Take 1 tablet by mouth daily.        . nitroGLYCERIN (NITROSTAT) 0.4 MG SL tablet Place 0.4 mg under the tongue every 5 (five) minutes as needed. For chest pain      . torsemide (DEMADEX) 20 MG tablet Take 20 mg by mouth 2 (two) times daily.          Past Medical History  Diagnosis Date  . DM (diabetes mellitus)    Long-term insulin  . Hypertension   . Tobacco use disorder     Discontinued March 2012  . Hyperlipidemia     Hypertriglyceridemia 449 HDL 25  . Diabetic Charcot's joint disease   . Coronary artery disease     Status post cardiac catheterization June 2012 scattered coronary artery disease/atherosclerosis with 70-80% stenosis in a small palpable right PDA lesion.  . Gastroparesis   . Peripheral vascular disease     Tibial occlusive disease evaluated by Dr. Hart Rochester in August 2011. Medical therapy  . 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 CIN III (cervical intraepithelial neoplasia grade III) with severe dysplasia     S/P LEEP AND CONE  . Angina   . Chronic anemia     2nd to renal disease  . Iron deficiency anemia   . End stage renal disease on dialysis     On dialysis since March 2012  . End stage renal disease on dialysis 05/02/11    [facility]; M; W, F; last time 05/01/11  . Migraines     "just on dialysis days"  . 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 CHF (congestive heart failure)   . Shortness of breath 05/02/11    "resting"  . History of abscesses in groins 12/06/2010    History   Social History  . Marital Status: Married    Spouse Name: [name]    Number of Children: [name]  . Years of Education: [name]   Occupational History  .  [employer]   Social History Main Topics  . Smoking status: Former Smoker -- 0.3 packs/day for 10 years    Types: Cigarettes    Quit date: 05/27/2008  . Smokeless tobacco: Never Used   Comment: "quit smoking cigarettes 07/2010"  . Alcohol Use: No  . Drug Use: No  . Sexually Active: Yes    Birth Control/ Protection: Condom   Other Topics Concern  . Not on file   Social History Narrative  . No narrative on file    Family History  Problem Relation Age of Onset  . Diabetes Mother   . Diabetes Father   . Diabetes Sister   . Breast cancer Maternal Aunt   . Cancer Maternal Aunt     breast    ROS: no  nausea, vomiting; no fever, chills; no melena, hematochezia; no claudication  PHYSICAL EXAM:  BP 118/71  Pulse 73  Ht 6\' 4"  (1.93 m)  Wt 232 lb (105.235 kg)  BMI 28.24 kg/m2  SpO2 96% GENERAL: 35 yof, sitting upright; NAD HEENT: NCAT, PERRLA, EOMI; sclera clear; no xanthelasma NECK: palpable bilateral carotid pulses, no bruits; no JVD; no TM LUNGS: CTA bilaterally CARDIAC: RRR (S1, S2); no significant murmurs; no rubs or gallops ABDOMEN: soft, non-tender; intact BS EXTREMETIES: intact distal pulses; no significant peripheral edema SKIN: warm/dry; no obvious rash/lesions MUSCULOSKELETAL: no joint deformity NEURO: no focal deficit; NL affect   EKG:    ASSESSMENT & PLAN:

## 2011-06-13 NOTE — Assessment & Plan Note (Signed)
Followed by Dr. Vyas 

## 2011-06-13 NOTE — Assessment & Plan Note (Addendum)
No further cardiac workup indicated at this point in time.Continue current medication regimen. Of note, however, patient remains on Plavix, with ASA previously listed as an allergy. She states that she was told she was a "free bleeder" at a young age. Given that she continues to tolerate Plavix, I recommend continuing this, pending reassessment in the future of whether or not she is truly allergic to it ASA.

## 2011-06-13 NOTE — Patient Instructions (Signed)
Your physician wants you to follow-up in: 6 months. You will receive a reminder letter in the mail one-two months in advance. If you don't receive a letter, please call our office to schedule the follow-up appointment. Your physician recommends that you continue on your current medications as directed. Please refer to the Current Medication list given to you today. Your physician recommends that you go to the [facility] for a FASTING lipid profile and liver function labs. Do not eat or drink after midnight.  If the results of your test are normal or stable, you will receive a letter. If they are abnormal, the nurse will contact you by phone.  

## 2011-07-28 ENCOUNTER — Other Ambulatory Visit: Payer: Self-pay | Admitting: Cardiology

## 2011-08-27 ENCOUNTER — Other Ambulatory Visit: Payer: Self-pay | Admitting: *Deleted

## 2011-08-27 MED ORDER — ATORVASTATIN CALCIUM 80 MG PO TABS: 80.0000 mg | ORAL_TABLET | Freq: Every day | ORAL | Status: DC

## 2011-08-27 MED ORDER — CLONIDINE HCL 0.1 MG PO TABS: 0.1000 mg | ORAL_TABLET | Freq: Two times a day (BID) | ORAL | Status: DC

## 2011-08-27 MED ORDER — AMLODIPINE BESYLATE 10 MG PO TABS: 10.0000 mg | ORAL_TABLET | Freq: Every day | ORAL | Status: DC

## 2011-08-27 MED ORDER — ISOSORBIDE MONONITRATE ER 120 MG PO TB24: 120.0000 mg | ORAL_TABLET | Freq: Every day | ORAL | Status: DC

## 2011-08-27 MED ORDER — CLOPIDOGREL BISULFATE 75 MG PO TABS: 75.0000 mg | ORAL_TABLET | Freq: Every day | ORAL | Status: DC

## 2011-09-05 ENCOUNTER — Other Ambulatory Visit: Payer: Self-pay

## 2011-09-17 ENCOUNTER — Encounter: Payer: Self-pay | Admitting: *Deleted

## 2011-09-23 ENCOUNTER — Encounter (HOSPITAL_COMMUNITY): Payer: Self-pay

## 2011-09-23 ENCOUNTER — Emergency Department (HOSPITAL_COMMUNITY)
Admission: EM | Admit: 2011-09-23 | Discharge: 2011-09-23 | Discharge status: Against Medical Advice | Payer: 59 | Attending: Emergency Medicine | Admitting: Emergency Medicine

## 2011-09-23 DIAGNOSIS — R0602 Shortness of breath: Secondary | ICD-10-CM | POA: Insufficient documentation

## 2011-09-23 DIAGNOSIS — Z794 Long term (current) use of insulin: Secondary | ICD-10-CM | POA: Insufficient documentation

## 2011-09-23 DIAGNOSIS — R079 Chest pain, unspecified: Secondary | ICD-10-CM

## 2011-09-23 DIAGNOSIS — E785 Hyperlipidemia, unspecified: Secondary | ICD-10-CM | POA: Insufficient documentation

## 2011-09-23 DIAGNOSIS — N186 End stage renal disease: Secondary | ICD-10-CM | POA: Insufficient documentation

## 2011-09-23 DIAGNOSIS — I509 Heart failure, unspecified: Secondary | ICD-10-CM | POA: Insufficient documentation

## 2011-09-23 DIAGNOSIS — I120 Hypertensive chronic kidney disease with stage 5 chronic kidney disease or end stage renal disease: Secondary | ICD-10-CM | POA: Insufficient documentation

## 2011-09-23 DIAGNOSIS — I2510 Atherosclerotic heart disease of native coronary artery without angina pectoris: Secondary | ICD-10-CM | POA: Insufficient documentation

## 2011-09-23 DIAGNOSIS — I214 Non-ST elevation (NSTEMI) myocardial infarction: Secondary | ICD-10-CM | POA: Insufficient documentation

## 2011-09-23 DIAGNOSIS — E119 Type 2 diabetes mellitus without complications: Secondary | ICD-10-CM | POA: Insufficient documentation

## 2011-09-23 DIAGNOSIS — Z992 Dependence on renal dialysis: Secondary | ICD-10-CM | POA: Insufficient documentation

## 2011-09-23 LAB — BASIC METABOLIC PANEL
BUN: 56 mg/dL — ABNORMAL HIGH (ref 6–23)
CO2: 26 mEq/L (ref 19–32)
Chloride: 103 mEq/L (ref 96–112)
Creatinine, Ser: 5.52 mg/dL — ABNORMAL HIGH (ref 0.50–1.10)
GFR calc Af Amer: 11 mL/min — ABNORMAL LOW (ref >90)
Potassium: 4.8 mEq/L (ref 3.5–5.1)

## 2011-09-23 LAB — CK TOTAL AND CKMB (NOT AT ARMC)
CK, MB: 4.5 ng/mL — ABNORMAL HIGH (ref 0.3–4.0)
Relative Index: INVALID (ref 0.0–2.5)

## 2011-09-23 LAB — TROPONIN I: Troponin I: 1.83 ng/mL (ref <0.30)

## 2011-09-23 MED ORDER — MORPHINE SULFATE 4 MG/ML IJ SOLN
4.0000 mg | Freq: Once | INTRAMUSCULAR | Status: AC
  Administered 2011-09-23: 4 mg via INTRAMUSCULAR
  Filled 2011-09-23: qty 1

## 2011-09-23 NOTE — ED Notes (Signed)
IV team returned call. They will come and de-access the fistula.

## 2011-09-23 NOTE — ED Notes (Addendum)
Pt reports that she has this pain off and on for a while.  Pt had a cath June 2012.  Pt was approximately 1.5 hrs into 4 hrs of dialysis.  Per EMS, VSS and pt refused Nitro.  Left forearm AV fistula still open.  Placed call to dialysis to have access closed.  Pt had 325 mg ASA.

## 2011-09-23 NOTE — ED Notes (Addendum)
Called dialysis again to check on having AV fistula closed.  Dialysis stated it will be a while and advised to call IV team.  Placed call to IV team.

## 2011-09-23 NOTE — Consult Note (Signed)
CARDIOLOGY CONSULT NOTE  Patient ID: [name] MRN: [id number], DOB/AGE: 1976-03-19   Admit date: 09/23/2011 Date of Consult: 09/23/2011   Primary Physician: Ignatius Specking., MD, MD Primary Cardiologist: GD  Pt. Profile  [name] is a 36 y.o. female with a history of CAD. She had a cath in June 2012 with non-critical disease and medical therapy was recommended. She was at HD today, about 1-1/2 hours in and had chest pain. She was sent to the ER.  Problem List  Past Medical History  Diagnosis Date  . DM (diabetes mellitus)     Long-term insulin  . Hypertension   . Tobacco use disorder     Discontinued March 2012  . Hyperlipidemia     Hypertriglyceridemia 449 HDL 25  . Diabetic Charcot's joint disease   . Coronary artery disease     Status post cardiac catheterization June 2012 scattered coronary artery disease/atherosclerosis with 70-80% stenosis in a small palpable right PDA lesion.  . Gastroparesis   . Peripheral vascular disease     Tibial occlusive disease evaluated by Dr. Hart Rochester in August 2011. Medical therapy  . 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 CIN III (cervical intraepithelial neoplasia grade III) with severe dysplasia     S/P LEEP AND CONE  . Angina   . Chronic anemia     2nd to renal disease  . Iron deficiency anemia   . End stage renal disease on dialysis     On dialysis since March 2012  . End stage renal disease on dialysis 05/02/11    [facility]; M; W, F; last time 05/01/11  . Migraines     "just on dialysis days"  . Peripheral neuropathy     related to DM  . 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 CHF (congestive heart failure)   . Shortness of breath 05/02/11    "resting"  . History of abscesses in groins 12/06/2010    Past Surgical History  Procedure Date  . Av fistula placement 04/2010  . Dilation and curettage of uterus 2009  . Cervical biopsy  w/ loop electrode excision     h/o  . Cervical cone biopsy     h/o      Allergies  Allergies  Allergen Reactions  . Cephalexin     REACTION: ?dz at young age  . Sulfamethoxazole W/Trimethoprim     REACTION: thrush    HPI   36 yo caucasian female w/ prior hx of small vessel CAD who presents from outpatient dialysis center for chest pain that developed during dialysis treatment. She states it was substernal, 7/10 and did have some pressure. She denies any dyspnea, nausea or diaphoresis. There was also no radiation to back, neck or arms. She was brought to [facility] ED via EMS with ECG changes suggestive of ischemia in lateral leads. She received Aspirin 81 mg, Morphine 4 mg, but she refused Nitro. Currently she is pain free and the episode is resolved completely per pt, and she wants to go home.   Prior to Admission medications   Medication Sig Start Date End Date Taking? Authorizing Provider  amLODipine (NORVASC) 10 MG tablet Take 1 tablet (10 mg total) by mouth daily. 08/27/11  Yes June Leap, MD  atorvastatin (LIPITOR) 80 MG tablet Take 1 tablet (80 mg total) by mouth daily. 08/27/11  Yes Rande Brunt, PA  calcium acetate (PHOSLO) 667 MG capsule Take 3 capsules (2,001 mg total) by mouth 3 (  three) times daily with meals. 05/03/11 05/02/12 Yes Lizbeth Bark, FNP  carvedilol (COREG) 25 MG tablet Take 25 mg by mouth 2 (two) times daily.     Yes Historical Provider, MD  cloNIDine (CATAPRES) 0.1 MG tablet Take 1 tablet (0.1 mg total) by mouth 2 (two) times daily. 08/27/11  Yes June Leap, MD  clopidogrel (PLAVIX) 75 MG tablet Take 1 tablet (75 mg total) by mouth daily. 08/27/11  Yes June Leap, MD  insulin glargine (LANTUS) 100 UNIT/ML injection Inject 32 Units into the skin at bedtime. Patient reports taking 32units at home and instructed to resume 05/03/11 05/02/12 Yes Lizbeth Bark, FNP  insulin lispro (HUMALOG) 100 UNIT/ML injection Inject 6-8 Units into the skin 3 (three) times daily before meals. Per sliding scale   Yes Historical Provider, MD  isosorbide mononitrate  (IMDUR) 120 MG 24 hr tablet Take 1 tablet (120 mg total) by mouth daily. 08/27/11  Yes June Leap, MD  labetalol (NORMODYNE) 200 MG tablet Take 200 mg by mouth 2 (two) times daily.     Yes Historical Provider, MD  nitroGLYCERIN (NITROSTAT) 0.4 MG SL tablet Place 0.4 mg under the tongue every 5 (five) minutes as needed. For chest pain   Yes Historical Provider, MD  torsemide (DEMADEX) 20 MG tablet Take 20 mg by mouth 2 (two) times daily.     Yes Historical Provider, MD    Family History Family History  Problem Relation Age of Onset  . Diabetes Mother   . Diabetes Father   . Diabetes Sister   . Breast cancer Maternal Aunt   . Cancer Maternal Aunt     breast     Social History History   Social History  . Marital Status: Married    Spouse Name: [name]    Number of Children: [name]  . Years of Education: [name]   Occupational History  .  [employer]   Social History Main Topics  . Smoking status: Former Smoker -- 0.3 packs/day for 10 years    Types: Cigarettes    Quit date: 05/27/2008  . Smokeless tobacco: Never Used   Comment: "quit smoking cigarettes 07/2010"  . Alcohol Use: No  . Drug Use: No  . Sexually Active: Yes    Birth Control/ Protection: Condom   Other Topics Concern  . Not on file   Social History Narrative  . No narrative on file    Review of Systems  General:  No chills, fever, night sweats or weight changes.  Cardiovascular:  Recent chest pain-since resolved. No dyspnea on exertion, edema, orthopnea, palpitations, paroxysmal nocturnal dyspnea. Dermatological: No rash, lesions/masses. Respiratory: No cough, dyspnea. Urologic: No hematuria, dysuria. Abdominal:   No nausea, vomiting, diarrhea, bright red blood per rectum, melena, or hematemesis. Neurologic:  No visual changes, wkns, changes in mental status. All other systems reviewed and are otherwise negative except as noted above.  Physical Exam  Blood pressure 162/81, temperature 98.2 F (36.8 C), temperature  source Oral, resp. rate 18, SpO2 100.00%.  General: Irritable, but civil. NAD. She is ready to leave. Psych: Normal affect. Normal speech.  Neuro: Alert and oriented X 3. Moves all extremities spontaneously. HEENT: Normal.  Neck: Supple without bruits or JVD. Lungs:  Resp regular and unlabored, CTA. Heart: RRR no s3, s4, or murmurs. Abdomen: Soft, non-tender, non-distended, BS + x 4.  Extremities: No clubbing, cyanosis or edema. DP/PT/Radials 2+ and equal bilaterally.  Labs  [facility] 09/23/11 1032  CKTOTAL  84  CKMB 4.5*  TROPONINI 1.83*     Lab 09/23/11 1032  NA 141  K 4.8  CL 103  CO2 26  BUN 56*  CREATININE 5.52*  CALCIUM 9.8  PROT --  BILITOT --  ALKPHOS --  ALT --  AST --  GLUCOSE 131*    ECG  Sinus Rhythm, rate of 85 Normal axis, ST depression in inferolateral leads with reciprocal changes - more pronounced.  T wave inversion in V5 and V6.  ASSESSMENT AND PLAN  1. NSTEMI-Pt with chest pain, elevated CE enzymes and ECG changes. Upon evaluation in the ED, she was pain free and anxious to leave as her husband had to get to work. Pt was counseled to stay as she needs further treatment and workup, however, she is very adamant about leaving. Dr. Eden Emms also attempted to counsel pt as well to no avail. Pt does have NTG, aspirin and Plavix at home. She was given instruction to continue these medications and call EMS at the first sensation of chest pain or dyspnea. Pt and husband agreed and verbalized understanding. She agrees to follow up as out pt and has a myoview scheduled on May 7 at 0915 and a follow up appt in [place] office on May 8 at 1:40.  She is leaving AMA from the ER today.   Signed, Nicolasa Ducking, NP 09/23/2011, 1:45 PM  Patient examined and chart reviewd.  She looks fine and is pain free.  CPK negative and mildly positive troponin in setting of CRF not likely significant.  However she had ST segment changes on her ECG;s  Patient was adament about leaving  AMA.  She is on asa and plavix and has nitro.  Will arrange F/U with GD and outpatient myovue.  She understands risk of MI by leaving against our advice  Charlton Haws 3:02 PM 09/23/2011

## 2011-09-23 NOTE — ED Provider Notes (Signed)
Medical screening examination/treatment/procedure(s) were performed by non-physician practitioner and as supervising physician I was immediately available for consultation/collaboration.   Emylie Amster A Benny Henrie, MD 09/23/11 1618 

## 2011-09-23 NOTE — ED Provider Notes (Signed)
History     CSN: [id number]  Arrival date & time 09/23/11  1478   First MD Initiated Contact with Patient 09/23/11 [phone]      Chief Complaint  Patient presents with  . Chest Pain    (Consider location/radiation/quality/duration/timing/severity/associated sxs/prior treatment) Patient is a 36 y.o. female presenting with chest pain. The history is provided by the patient.  Chest Pain The chest pain began 3 - 5 hours ago. Chest pain occurs constantly. The chest pain is unchanged. Primary symptoms include shortness of breath. Pertinent negatives for primary symptoms include no fever, no abdominal pain, no nausea and no vomiting. Associated symptoms comments: She started having chest pain during dialysis treatment today, unable to complete dialysis. She reports SOB without cough or fever. She states she has had chest pain previously and underwent catheterization last year by Dr. Riley Kill but denies stents were placed. The pain feels the same as at that time. .     Past Medical History  Diagnosis Date  . DM (diabetes mellitus)     Long-term insulin  . Hypertension   . Tobacco use disorder     Discontinued March 2012  . Hyperlipidemia     Hypertriglyceridemia 449 HDL 25  . Diabetic Charcot's joint disease   . Coronary artery disease     Status post cardiac catheterization June 2012 scattered coronary artery disease/atherosclerosis with 70-80% stenosis in a small palpable right PDA lesion.  . Gastroparesis   . Peripheral vascular disease     Tibial occlusive disease evaluated by Dr. Hart Rochester in August 2011. Medical therapy  . 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 CIN III (cervical intraepithelial neoplasia grade III) with severe dysplasia     S/P LEEP AND CONE  . Angina   . Chronic anemia     2nd to renal disease  . Iron deficiency anemia   . End stage renal disease on dialysis     On dialysis since March 2012  . End stage renal disease on dialysis 05/02/11    [facility]; M; W, F; last time 05/01/11   . Migraines     "just on dialysis days"  . Peripheral neuropathy     related to DM  . 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 CHF (congestive heart failure)   . Shortness of breath 05/02/11    "resting"  . History of abscesses in groins 12/06/2010    Past Surgical History  Procedure Date  . Av fistula placement 04/2010  . Dilation and curettage of uterus 2009  . Cervical biopsy  w/ loop electrode excision     h/o  . Cervical cone biopsy     h/o    Family History  Problem Relation Age of Onset  . Diabetes Mother   . Diabetes Father   . Diabetes Sister   . Breast cancer Maternal Aunt   . Cancer Maternal Aunt     breast    History  Substance Use Topics  . Smoking status: Former Smoker -- 0.3 packs/day for 10 years    Types: Cigarettes    Quit date: 05/27/2008  . Smokeless tobacco: Never Used   Comment: "quit smoking cigarettes 07/2010"  . Alcohol Use: No    OB History    Grav Para Term Preterm Abortions TAB SAB Ect Mult Living   2 1   1     1       Review of Systems  Constitutional: Negative.  Negative for fever.  Respiratory: Positive for  shortness of breath.   Cardiovascular: Positive for chest pain.  Gastrointestinal: Negative for nausea, vomiting and abdominal pain.    Allergies  Cephalexin and Sulfamethoxazole w-trimethoprim  Home Medications   Current Outpatient Rx  Name Route Sig Dispense Refill  . AMLODIPINE BESYLATE 10 MG PO TABS Oral Take 1 tablet (10 mg total) by mouth daily. 90 tablet 3  . ATORVASTATIN CALCIUM 80 MG PO TABS Oral Take 1 tablet (80 mg total) by mouth daily. 90 tablet 1  . CALCIUM ACETATE 667 MG PO CAPS Oral Take 3 capsules (2,001 mg total) by mouth 3 (three) times daily with meals. 2001 capsule 0  . CARVEDILOL 25 MG PO TABS Oral Take 25 mg by mouth 2 (two) times daily.      Marland Kitchen CLONIDINE HCL 0.1 MG PO TABS Oral Take 1 tablet (0.1 mg total) by mouth 2 (two) times daily. 180 tablet 3  . CLOPIDOGREL BISULFATE 75 MG PO TABS Oral  Take 1 tablet (75 mg total) by mouth daily. 90 tablet 3  . INSULIN GLARGINE 100 UNIT/ML [place] SOLN Subcutaneous Inject 32 Units into the skin at bedtime. Patient reports taking 32units at home and instructed to resume 10 mL 0  . INSULIN LISPRO (HUMAN) 100 UNIT/ML [place] SOLN Subcutaneous Inject 6-8 Units into the skin 3 (three) times daily before meals. Per sliding scale    . ISOSORBIDE MONONITRATE ER 120 MG PO TB24 Oral Take 1 tablet (120 mg total) by mouth daily. 90 tablet 3  . LABETALOL HCL 200 MG PO TABS Oral Take 200 mg by mouth 2 (two) times daily.      Marland Kitchen NITROGLYCERIN 0.4 MG SL SUBL Sublingual Place 0.4 mg under the tongue every 5 (five) minutes as needed. For chest pain    . TORSEMIDE 20 MG PO TABS Oral Take 20 mg by mouth 2 (two) times daily.        BP 162/81  Temp(Src) 98.2 F (36.8 C) (Oral)  Resp 18  SpO2 100%  Physical Exam  Constitutional: She appears well-developed and well-nourished.  HENT:  Head: Normocephalic.  Neck: Normal range of motion. Neck supple.  Cardiovascular: Normal rate and regular rhythm.   Pulmonary/Chest: Effort normal and breath sounds normal.  Abdominal: Soft. Bowel sounds are normal. There is no tenderness. There is no rebound and no guarding.  Musculoskeletal: Normal range of motion.  Neurological: She is alert. No cranial nerve deficit.  Skin: Skin is warm and dry. No rash noted.  Psychiatric: She has a normal mood and affect.    ED Course  Procedures (including critical care time)  Labs Reviewed  BASIC METABOLIC PANEL - Abnormal; Notable for the following:    Glucose, Bld 131 (*)    BUN 56 (*)    Creatinine, Ser 5.52 (*)    GFR calc non Af Amer 9 (*)    GFR calc Af Amer 11 (*)    All other components within normal limits  TROPONIN I - Abnormal; Notable for the following:    Troponin I 1.83 (*)    All other components within normal limits  CK TOTAL AND CKMB - Abnormal; Notable for the following:    CK, MB 4.5 (*)    All other components  within normal limits   Results for orders placed during the hospital encounter of 09/23/11  BASIC METABOLIC PANEL      Component Value Range   Sodium 141  135 - 145 (mEq/L)   Potassium 4.8  3.5 - 5.1 (mEq/L)  Chloride 103  96 - 112 (mEq/L)   CO2 26  19 - 32 (mEq/L)   Glucose, Bld 131 (*) 70 - 99 (mg/dL)   BUN 56 (*) 6 - 23 (mg/dL)   Creatinine, Ser 9.56 (*) 0.50 - 1.10 (mg/dL)   Calcium 9.8  8.4 - 21.3 (mg/dL)   GFR calc non Af Amer 9 (*) >90 (mL/min)   GFR calc Af Amer 11 (*) >90 (mL/min)  TROPONIN I      Component Value Range   Troponin I 1.83 (*) <0.30 (ng/mL)  CK TOTAL AND CKMB      Component Value Range   Total CK 84  7 - 177 (U/L)   CK, MB 4.5 (*) 0.3 - 4.0 (ng/mL)   Relative Index RELATIVE INDEX IS INVALID  0.0 - 2.5    No results found.  Date: 09/23/2011  Rate: 89  Rhythm: normal sinus rhythm  QRS Axis: normal  Intervals: normal  ST/T Wave abnormalities: ST depressions anteriorly and ST depressions laterally  Conduction Disutrbances:none  Narrative Interpretation:   Old EKG Reviewed: changes noted   No results found.   No diagnosis found. 1. Chest pain 2. H/O CAD by cardiac cath 2012 3. ESRD - HD   MDM  Records reviewed. Cardiac Cath done 11/14/10 with moderate disease - 30-40% lesion proximal LAD, 40% lesion RCA, 70-80% lesion mid-PDA not thought contributory to ischemia based on location of changes on EKG. She was medically managed at that time and considered a candidate for PCI at a later time if symptoms persisted and anemia was improved. EKG today shows ST depressions anteriorly (where she showed signs of ischemia in the past) and in V4-V6. The patient is currently sleeping on multiple rechecks and VS remain stable. [facility] consulted for further evaluation given EKG changes and history of known CAD.        Rodena Medin, PA-C 09/23/11 1401

## 2011-09-23 NOTE — ED Notes (Signed)
Called dialysis to have open left arm AV fistula de-accessed.

## 2011-10-01 ENCOUNTER — Ambulatory Visit (HOSPITAL_COMMUNITY): Payer: 59 | Attending: Cardiology | Admitting: Radiology

## 2011-10-01 VITALS — BP 107/77 | Ht 76.0 in

## 2011-10-01 DIAGNOSIS — R0602 Shortness of breath: Secondary | ICD-10-CM | POA: Insufficient documentation

## 2011-10-01 DIAGNOSIS — N186 End stage renal disease: Secondary | ICD-10-CM | POA: Insufficient documentation

## 2011-10-01 DIAGNOSIS — I509 Heart failure, unspecified: Secondary | ICD-10-CM | POA: Insufficient documentation

## 2011-10-01 DIAGNOSIS — I120 Hypertensive chronic kidney disease with stage 5 chronic kidney disease or end stage renal disease: Secondary | ICD-10-CM | POA: Insufficient documentation

## 2011-10-01 DIAGNOSIS — Z794 Long term (current) use of insulin: Secondary | ICD-10-CM | POA: Insufficient documentation

## 2011-10-01 DIAGNOSIS — I739 Peripheral vascular disease, unspecified: Secondary | ICD-10-CM | POA: Insufficient documentation

## 2011-10-01 DIAGNOSIS — I2510 Atherosclerotic heart disease of native coronary artery without angina pectoris: Secondary | ICD-10-CM

## 2011-10-01 DIAGNOSIS — E119 Type 2 diabetes mellitus without complications: Secondary | ICD-10-CM | POA: Insufficient documentation

## 2011-10-01 DIAGNOSIS — R5383 Other fatigue: Secondary | ICD-10-CM | POA: Insufficient documentation

## 2011-10-01 DIAGNOSIS — I252 Old myocardial infarction: Secondary | ICD-10-CM | POA: Insufficient documentation

## 2011-10-01 DIAGNOSIS — R079 Chest pain, unspecified: Secondary | ICD-10-CM

## 2011-10-01 DIAGNOSIS — R002 Palpitations: Secondary | ICD-10-CM | POA: Insufficient documentation

## 2011-10-01 DIAGNOSIS — R0989 Other specified symptoms and signs involving the circulatory and respiratory systems: Secondary | ICD-10-CM | POA: Insufficient documentation

## 2011-10-01 DIAGNOSIS — R0609 Other forms of dyspnea: Secondary | ICD-10-CM | POA: Insufficient documentation

## 2011-10-01 DIAGNOSIS — E785 Hyperlipidemia, unspecified: Secondary | ICD-10-CM | POA: Insufficient documentation

## 2011-10-01 DIAGNOSIS — Z87891 Personal history of nicotine dependence: Secondary | ICD-10-CM | POA: Insufficient documentation

## 2011-10-01 DIAGNOSIS — R9431 Abnormal electrocardiogram [ECG] [EKG]: Secondary | ICD-10-CM

## 2011-10-01 DIAGNOSIS — R5381 Other malaise: Secondary | ICD-10-CM | POA: Insufficient documentation

## 2011-10-01 MED ORDER — TECHNETIUM TC 99M TETROFOSMIN IV KIT
11.0000 | PACK | Freq: Once | INTRAVENOUS | Status: AC | PRN
  Administered 2011-10-01: 11 via INTRAVENOUS

## 2011-10-01 MED ORDER — TECHNETIUM TC 99M TETROFOSMIN IV KIT
33.0000 | PACK | Freq: Once | INTRAVENOUS | Status: AC | PRN
  Administered 2011-10-01: 33 via INTRAVENOUS

## 2011-10-01 MED ORDER — REGADENOSON 0.4 MG/5ML IV SOLN
0.4000 mg | Freq: Once | INTRAVENOUS | Status: AC
  Administered 2011-10-01: 0.4 mg via INTRAVENOUS

## 2011-10-01 NOTE — Progress Notes (Signed)
[facility] SITE 3 NUCLEAR MED [address] [place] [address] [phone]  Cardiology Nuclear Med Study  [name] is a 36 y.o. female     MRN : [id number]     DOB: May 09, 1976  Procedure Date: 10/01/2011  Nuclear Med Background Indication for Stress Test:  Evaluation for Ischemia, and Patient seen in hospital on 09/23/11 ED: NSTEMI with CP, EKG changes lateral leads, Elevated enzymes. Patient left hospital AMA History:  CHF, ESRD, '11 ECHO: EF: 55-60%, '11 MI: EF: 49% Abn mid to basal inferior defect,6/12 Heart Cath: 70-80% stenosis PDA non critical DZ Tx with Rx Cardiac Risk Factors: History of Smoking, Hypertension, IDDM Type 2, Lipids and PVD  Symptoms:  Chest Pain, DOE, Fatigue, Palpitations and SOB   Nuclear Pre-Procedure Caffeine/Decaff Intake:  None> 12 hrs NPO After: 10:30pm   Lungs:  clear O2 Sat: 98% on room air. IV 0.9% NS with Angio Cath:  20g  IV Site: R Antecubital x 1 ,tolerated well IV Started by:  Irean Hong, RN  Chest Size (in):  38 Cup Size: C  Height: 6\' 4"  (1.93 m)  Weight:  232  BMI:  There is no weight on file to calculate BMI. Tech Comments:  Patient complaining dizziness on arrival with BP 90/60, HR 82.The patient denied CP or SOB. Patient placed in trendelenburg position with BP 100/68, HR 80, O2 sat 97%. IV 0.9% NACL 250cc hung open with  dizziness subsiding, BP 110/70 HR 76. This patient has a very abnormal EKG, she had her test and I had them check her pictures after her 2nd set. They were found to be abnormal. They were taken down to the DOD T. Brackbill and he advised T. Yount the RRT to send her home and make sure she goes to her follow up appointment with [facility] in [place], [place]. Advised to go to the ED or call 911 for any problems.    Nuclear Med Study 1 or 2 day study: 1 day  Stress Test Type:  Lexiscan  Reading MD: Willa Rough, MD  Order Authorizing Provider:  Charlton Haws, MD  Resting Radionuclide: Technetium 90m  Tetrofosmin  Resting Radionuclide Dose: 10.9 mCi   Stress Radionuclide:  Technetium 40m Tetrofosmin  Stress Radionuclide Dose: 32.0 mCi           Stress Protocol Rest HR: 74 Stress HR: 85  Rest BP: 131/76 Stress BP: 133/69  Exercise Time (min): n/a METS: n/a   Predicted Max HR: 185 bpm % Max HR: 45.95 bpm Rate Pressure Product: 47425   Dose of Adenosine (mg):  n/a Dose of Lexiscan: 0.4 mg  Dose of Atropine (mg): n/a Dose of Dobutamine: n/a mcg/kg/min (at max HR)  Stress Test Technologist: Milana Na, EMT-P  Nuclear Technologist:  Doyne Keel, CNMT     Rest Procedure:  Myocardial perfusion imaging was performed at rest 45 minutes following the intravenous administration of Technetium 69m Tetrofosmin. Rest ECG: NSR with non-specific ST-T wave changes  Stress Procedure:  The patient received IV Lexiscan 0.4 mg over 15-seconds.  Technetium 83m Tetrofosmin injected at 30-seconds.  There were non specific changes with Lexiscan.  Quantitative spect images were obtained after a 45 minute delay. Stress ECG: No significant ST segment change suggestive of ischemia.  QPS Raw Data Images:  Patient motion noted; appropriate software correction applied. Stress Images:  There is moderate decreased activity in a small area affecting the base and mid inferior wall and the mid inferolateral wall. There is  also a small area with decreased activity that is mild at the base of the anterior wall. Rest Images:  Moderate decreased activity and a small area at the base and mid inferior wall and mid inferolateral wall. Subtraction (SDS):  There is suggestion of mild ischemia at the base of the anterior wall. Transient Ischemic Dilatation (Normal <1.22):  1.04 Lung/Heart Ratio (Normal <0.45):  0.30  Quantitative Gated Spect Images QGS EDV:  166 ml QGS ESV:  83 ml  Impression Exercise Capacity:  Lexiscan with no exercise. BP Response:  Normal blood pressure response. Clinical Symptoms:  feels  weird ECG Impression:  No significant ST segment change suggestive of ischemia. Comparison with Prior Nuclear Study: The findings seem similar to the report of May, 2011. The images are not available for comparison  Overall Impression:  This study suggests mild scar in the inferior wall at the base and mid ventricle. There is question of very mild ischemia at the base of the anterior wall.  LV Ejection Fraction: 50%.  LV Wall Motion:  There is some hypokinesis of the inferior wall.  Willa Rough. MD

## 2011-10-02 ENCOUNTER — Ambulatory Visit (INDEPENDENT_AMBULATORY_CARE_PROVIDER_SITE_OTHER): Payer: 59 | Admitting: Physician Assistant

## 2011-10-02 ENCOUNTER — Encounter: Payer: Self-pay | Admitting: Physician Assistant

## 2011-10-02 VITALS — BP 100/59 | HR 88 | Ht 76.0 in | Wt 231.4 lb

## 2011-10-02 DIAGNOSIS — E109 Type 1 diabetes mellitus without complications: Secondary | ICD-10-CM

## 2011-10-02 DIAGNOSIS — I2510 Atherosclerotic heart disease of native coronary artery without angina pectoris: Secondary | ICD-10-CM

## 2011-10-02 DIAGNOSIS — E785 Hyperlipidemia, unspecified: Secondary | ICD-10-CM

## 2011-10-02 DIAGNOSIS — I10 Essential (primary) hypertension: Secondary | ICD-10-CM

## 2011-10-02 NOTE — Assessment & Plan Note (Signed)
We'll request most recent FLP/LFT profile from Dr. Sherril Croon. Aggressive management recommended with target LDL 70 or less, if feasible. Continue high dose Lipitor, pending further recommendations.

## 2011-10-02 NOTE — Assessment & Plan Note (Signed)
Followed by Dr. Vyas 

## 2011-10-02 NOTE — Patient Instructions (Signed)
Follow up in 1 month. Your physician recommends that you continue on your current medications as directed. Please refer to the Current Medication list given to you today. 

## 2011-10-02 NOTE — Assessment & Plan Note (Signed)
Continue aggressive management of previously documented small vessel disease, following recent followup stress Myoview interpreted as a low risk study, per Dr. Eden Emms. This was following recent presentation to the ED with complaint of CP during HD, with associated EKG changes and a troponin I of 1.8. She refused recommendation to be admitted, and left AMA. She is currently asymptomatic, and we will continue to monitor closely. Aggressive secondary prevention measures recommended, including aggressive lipid management.

## 2011-10-02 NOTE — Progress Notes (Signed)
Just started feeling lightheaded since coming into office.  Dialysis this morning at 6:30 and feels really washed out.

## 2011-10-02 NOTE — Progress Notes (Signed)
HPI: Patient presents for post hospital followup, following recent evaluation at [facility] ED on 4/29, for evaluation of CP during HD. She had an abnormal MB of 4.5 and a troponin of by 1.8, with lateral EKG changes, and was advised to be admitted for further evaluation and treatment. However, she was very adamant about leaving, and ultimately left AMA. She was noted to be pain free at time of evaluation. She indicated she had NTG, ASA, and Plavix at home. Arrangements were made for an outpatient Myoview, which was completed yesterday and reviewed by Dr. Eden Emms. He felt that this represented a low risk scan, and noted that she had only small vessel disease by catheterization in June 2012. He recommended treating medically and to not repeat a catheterization, unless she had recurrent problems during dialysis.  Clinically, she has not had similar severe CP during HD, as she did during her recent presentation. She has not had to use any NTG tablets. Although she denies exertional CP, she does have occasional CP at rest, as she did this morning. Interestingly, she reported a systolic BP of 180 this morning, prior to scheduled HD. She does not take her antihypertensives on scheduled HD days, or later that same evening if her systolic is less than 100. She states that her SBP is typically in the 130 -140 range.  Allergies  Allergen Reactions  . Cephalexin     REACTION: ?dz at young age  . Sulfamethoxazole W-Trimethoprim     REACTION: thrush    Current Outpatient Prescriptions  Medication Sig Dispense Refill  . amLODipine (NORVASC) 10 MG tablet Take 1 tablet (10 mg total) by mouth daily.  90 tablet  3  . atorvastatin (LIPITOR) 80 MG tablet Take 1 tablet (80 mg total) by mouth daily.  90 tablet  1  . calcium acetate (PHOSLO) 667 MG capsule Take 3 capsules (2,001 mg total) by mouth 3 (three) times daily with meals.  2001 capsule  0  . carvedilol (COREG) 25 MG tablet Take 25 mg by mouth 2 (two) times daily.          . cloNIDine (CATAPRES) 0.1 MG tablet Take 1 tablet (0.1 mg total) by mouth 2 (two) times daily.  180 tablet  3  . clopidogrel (PLAVIX) 75 MG tablet Take 1 tablet (75 mg total) by mouth daily.  90 tablet  3  . insulin glargine (LANTUS) 100 UNIT/ML injection Inject 32 Units into the skin at bedtime. Patient reports taking 32units at home and instructed to resume  10 mL  0  . insulin lispro (HUMALOG) 100 UNIT/ML injection Inject 6-8 Units into the skin 3 (three) times daily before meals. Per sliding scale      . isosorbide mononitrate (IMDUR) 120 MG 24 hr tablet Take 1 tablet (120 mg total) by mouth daily.  90 tablet  3  . labetalol (NORMODYNE) 200 MG tablet Take 200 mg by mouth 2 (two) times daily.        . nitroGLYCERIN (NITROSTAT) 0.4 MG SL tablet Place 0.4 mg under the tongue every 5 (five) minutes as needed. For chest pain      . torsemide (DEMADEX) 20 MG tablet Take 20 mg by mouth 2 (two) times daily.          Past Medical History  Diagnosis Date  . DM (diabetes mellitus)     Long-term insulin  . Hypertension   . Tobacco use disorder     Discontinued March 2012  . Hyperlipidemia  Hypertriglyceridemia 449 HDL 25  . Diabetic Charcot's joint disease   . Coronary artery disease     Status post cardiac catheterization June 2012 scattered coronary artery disease/atherosclerosis with 70-80% stenosis in a small right PDA.  . Gastroparesis   . Peripheral vascular disease     Tibial occlusive disease evaluated by Dr. Hart Rochester in August 2011. Medical therapy  . 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 CIN III (cervical intraepithelial neoplasia grade III) with severe dysplasia     S/P LEEP AND [facility]  . Angina   . Chronic anemia     2nd to renal disease  . Iron deficiency anemia   . End stage renal disease on dialysis     On dialysis since March 2012  . End stage renal disease on dialysis 05/02/11    [facility]; M; W, F; last time 05/01/11  . Migraines     "just on dialysis days"  . Peripheral neuropathy      related to DM  . 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 CHF (congestive heart failure)   . Shortness of breath 05/02/11    "resting"  . History of abscesses in groins 12/06/2010    Past Surgical History  Procedure Date  . Av fistula placement 04/2010  . Dilation and curettage of uterus 2009  . Cervical biopsy  w/ loop electrode excision     h/o  . Cervical [facility] biopsy     h/o    History   Social History  . Marital Status: Married    Spouse Name: [name]    Number of Children: [name]  . Years of Education: [name]   Occupational History  .  [employer]   Social History Main Topics  . Smoking status: Former Smoker -- 0.3 packs/day for 10 years    Types: Cigarettes    Quit date: 05/27/2008  . Smokeless tobacco: Never Used   Comment: "quit smoking cigarettes 07/2010"  . Alcohol Use: No  . Drug Use: No  . Sexually Active: Yes    Birth Control/ Protection: Condom   Other Topics Concern  . Not on file   Social History Narrative  . No narrative on file    Family History  Problem Relation Age of Onset  . Diabetes Mother   . Diabetes Father   . Diabetes Sister   . Breast cancer Maternal Aunt   . Cancer Maternal Aunt     breast    ROS: no nausea, vomiting; no fever, chills; no melena, hematochezia; no claudication  PHYSICAL EXAM: BP 100/59  Pulse 88  Ht 6\' 4"  (1.93 m)  Wt 231 lb 6.4 oz (104.962 kg)  BMI 28.17 kg/m2 GENERAL:  36 year old female; NAD HEENT: NCAT, PERRLA, EOMI; sclera clear; no xanthelasma NECK: palpable bilateral carotid pulses, no bruits; no JVD; no TM LUNGS: CTA bilaterally CARDIAC: RRR (S1, S2); no significant murmurs; no rubs or gallops ABDOMEN: soft, non-tender; intact BS EXTREMETIES: intact distal pulses; no significant peripheral edema SKIN: warm/dry; no obvious rash/lesions MUSCULOSKELETAL: no joint deformity NEURO: no focal deficit; NL affect   EKG: reviewed and available in Electronic Records   ASSESSMENT & PLAN:

## 2011-10-02 NOTE — Assessment & Plan Note (Signed)
Followed by nephrology in [place], and by Dr. Sherril Croon. Patient monitors her BP closely at home, and holds her medications if systolic less than 100. Would favor decreasing carvedilol, if needed, given history of normal LVF.

## 2011-10-03 ENCOUNTER — Telehealth: Payer: Self-pay | Admitting: Cardiology

## 2011-10-03 NOTE — Telephone Encounter (Signed)
New Problem:     Patient returned Christine's call about the results of her latest stress test.  Please call back.

## 2011-10-03 NOTE — Telephone Encounter (Signed)
Pt advised no significant s-t seg change--ef=50%--appears to be tiny area of ischemia bottom of heart area that is old injury--no new changes--pt agrees--nt

## 2011-10-03 NOTE — Telephone Encounter (Signed)
Advised no significant s-t seg

## 2011-10-08 ENCOUNTER — Telehealth: Payer: Self-pay | Admitting: Cardiovascular Disease

## 2011-10-08 NOTE — Telephone Encounter (Signed)
PT AWARE OF MYOVIEW RESULTS./CY 

## 2011-10-08 NOTE — Telephone Encounter (Signed)
Fu call °Pt returning your call  °

## 2011-10-14 ENCOUNTER — Telehealth (INDEPENDENT_AMBULATORY_CARE_PROVIDER_SITE_OTHER): Payer: Self-pay

## 2011-10-14 ENCOUNTER — Emergency Department (HOSPITAL_COMMUNITY): Admission: EM | Admit: 2011-10-14 | Discharge: 2011-10-14 | Discharge status: Against Medical Advice | Payer: 59

## 2011-10-14 ENCOUNTER — Emergency Department (HOSPITAL_COMMUNITY)
Admission: EM | Admit: 2011-10-14 | Discharge: 2011-10-14 | Disposition: A | Discharge status: Home / Self Care | Payer: 59 | Attending: Emergency Medicine | Admitting: Emergency Medicine

## 2011-10-14 ENCOUNTER — Encounter (HOSPITAL_COMMUNITY): Payer: Self-pay | Admitting: *Deleted

## 2011-10-14 DIAGNOSIS — Z794 Long term (current) use of insulin: Secondary | ICD-10-CM | POA: Insufficient documentation

## 2011-10-14 DIAGNOSIS — I120 Hypertensive chronic kidney disease with stage 5 chronic kidney disease or end stage renal disease: Secondary | ICD-10-CM | POA: Insufficient documentation

## 2011-10-14 DIAGNOSIS — N186 End stage renal disease: Secondary | ICD-10-CM | POA: Insufficient documentation

## 2011-10-14 DIAGNOSIS — Z992 Dependence on renal dialysis: Secondary | ICD-10-CM | POA: Insufficient documentation

## 2011-10-14 DIAGNOSIS — E119 Type 2 diabetes mellitus without complications: Secondary | ICD-10-CM | POA: Insufficient documentation

## 2011-10-14 DIAGNOSIS — L0291 Cutaneous abscess, unspecified: Secondary | ICD-10-CM

## 2011-10-14 DIAGNOSIS — N19 Unspecified kidney failure: Secondary | ICD-10-CM

## 2011-10-14 DIAGNOSIS — I509 Heart failure, unspecified: Secondary | ICD-10-CM | POA: Insufficient documentation

## 2011-10-14 DIAGNOSIS — E785 Hyperlipidemia, unspecified: Secondary | ICD-10-CM | POA: Insufficient documentation

## 2011-10-14 DIAGNOSIS — I2510 Atherosclerotic heart disease of native coronary artery without angina pectoris: Secondary | ICD-10-CM | POA: Insufficient documentation

## 2011-10-14 DIAGNOSIS — R509 Fever, unspecified: Secondary | ICD-10-CM

## 2011-10-14 DIAGNOSIS — G909 Disorder of the autonomic nervous system, unspecified: Secondary | ICD-10-CM | POA: Insufficient documentation

## 2011-10-14 DIAGNOSIS — E1149 Type 2 diabetes mellitus with other diabetic neurological complication: Secondary | ICD-10-CM | POA: Insufficient documentation

## 2011-10-14 DIAGNOSIS — N764 Abscess of vulva: Secondary | ICD-10-CM | POA: Insufficient documentation

## 2011-10-14 LAB — BASIC METABOLIC PANEL
CO2: 23 mEq/L (ref 19–32)
Calcium: 9.2 mg/dL (ref 8.4–10.5)
Glucose, Bld: 270 mg/dL — ABNORMAL HIGH (ref 70–99)
Sodium: 137 mEq/L (ref 135–145)

## 2011-10-14 LAB — GLUCOSE, CAPILLARY
Glucose-Capillary: 273 mg/dL — ABNORMAL HIGH (ref 70–99)
Glucose-Capillary: 396 mg/dL — ABNORMAL HIGH (ref 70–99)

## 2011-10-14 LAB — CBC
Hemoglobin: 8.5 g/dL — ABNORMAL LOW (ref 12.0–15.0)
MCH: 30.7 pg (ref 26.0–34.0)
RBC: 2.77 MIL/uL — ABNORMAL LOW (ref 3.87–5.11)

## 2011-10-14 MED ORDER — LIDOCAINE HCL 2 % IJ SOLN
10.0000 mL | Freq: Once | INTRAMUSCULAR | Status: AC
  Administered 2011-10-14: 200 mg

## 2011-10-14 MED ORDER — VANCOMYCIN HCL IN DEXTROSE 1-5 GM/200ML-% IV SOLN
1000.0000 mg | Freq: Once | INTRAVENOUS | Status: AC
  Administered 2011-10-14: 1000 mg via INTRAVENOUS
  Filled 2011-10-14: qty 200

## 2011-10-14 MED ORDER — HYDROCODONE-ACETAMINOPHEN 5-325 MG PO TABS: 1.0000 | ORAL_TABLET | ORAL | Status: DC | PRN

## 2011-10-14 MED ORDER — ACETAMINOPHEN 325 MG PO TABS
650.0000 mg | ORAL_TABLET | Freq: Once | ORAL | Status: AC
  Administered 2011-10-14: 650 mg via ORAL
  Filled 2011-10-14: qty 2

## 2011-10-14 MED ORDER — ERYTHROMYCIN BASE 250 MG PO TABS: 250.0000 mg | ORAL_TABLET | Freq: Four times a day (QID) | ORAL | Status: DC

## 2011-10-14 NOTE — ED Notes (Signed)
On fluid restrictions, dry wt += 103.5kg  TODAY--107.9

## 2011-10-14 NOTE — ED Provider Notes (Signed)
History     CSN: [id number]  Arrival date & time 10/14/11  1559   First MD Initiated Contact with Patient 10/14/11 1707      Chief Complaint  Patient presents with  . Fever  . Recurrent Skin Infections  . ESRD     HPI Pt states she had trouble with an abscess in the vaginal area.  [facility] and had an I&D.  She also had lab tests drawn which I have reviewed.  Pt has been having some chills and developed a fever today.  She called her nephrologist who recommended a dose of IV abx.  Pt  Returns to get a dose of abx.    Pt was due for dialysis today but was not able to go because she was not feeling good.  The pain has improved after the I&D.  She just feels worse when she gets up and moves around.   Past Medical History  Diagnosis Date  . DM (diabetes mellitus)     Long-term insulin  . Hypertension   . Tobacco use disorder     Discontinued March 2012  . Hyperlipidemia     Hypertriglyceridemia 449 HDL 25  . Diabetic Charcot's joint disease   . Coronary artery disease     Status post cardiac catheterization June 2012 scattered coronary artery disease/atherosclerosis with 70-80% stenosis in a small right PDA.  . Gastroparesis   . Peripheral vascular disease     Tibial occlusive disease evaluated by Dr. Hart Rochester in August 2011. Medical therapy  . 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 CIN III (cervical intraepithelial neoplasia grade III) with severe dysplasia     S/P LEEP AND [facility]  . Angina   . Chronic anemia     2nd to renal disease  . Iron deficiency anemia   . End stage renal disease on dialysis     On dialysis since March 2012  . End stage renal disease on dialysis 05/02/11    [facility]; M; W, F; last time 05/01/11  . Migraines     "just on dialysis days"  . Peripheral neuropathy     related to DM  . 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 CHF (congestive heart failure)   . Shortness of breath 05/02/11    "resting"  . History of abscesses in groins  12/06/2010    Past Surgical History  Procedure Date  . Av fistula placement 04/2010  . Dilation and curettage of uterus 2009  . Cervical biopsy  w/ loop electrode excision     h/o  . Cervical [facility] biopsy     h/o    Family History  Problem Relation Age of Onset  . Diabetes Mother   . Diabetes Father   . Diabetes Sister   . Breast cancer Maternal Aunt   . Cancer Maternal Aunt     breast    History  Substance Use Topics  . Smoking status: Former Smoker -- 0.3 packs/day for 10 years    Types: Cigarettes    Quit date: 05/27/2008  . Smokeless tobacco: Never Used   Comment: "quit smoking cigarettes 07/2010"  . Alcohol Use: No    OB History    Grav Para Term Preterm Abortions TAB SAB Ect Mult Living   2 1   1     1       Review of Systems  Constitutional: Positive for fever.  Gastrointestinal: Negative for vomiting and diarrhea.  Neurological: Negative for weakness.  All other systems reviewed and are negative.    Allergies  Cephalexin and Sulfamethoxazole w-trimethoprim  Home Medications   Current Outpatient Rx  Name Route Sig Dispense Refill  . AMLODIPINE BESYLATE 10 MG PO TABS Oral Take 10 mg by mouth daily.    . ATORVASTATIN CALCIUM 80 MG PO TABS Oral Take 80 mg by mouth every evening.    Marland Kitchen CALCIUM ACETATE 667 MG PO CAPS Oral Take 2,001 mg by mouth 3 (three) times daily with meals.    Marland Kitchen CARVEDILOL 25 MG PO TABS Oral Take 25 mg by mouth 2 (two) times daily.      Marland Kitchen CLONIDINE HCL 0.1 MG PO TABS Oral Take 0.1 mg by mouth 2 (two) times daily.    Marland Kitchen CLOPIDOGREL BISULFATE 75 MG PO TABS Oral Take 75 mg by mouth daily.    . ERYTHROMYCIN BASE 250 MG PO TABS Oral Take 1 tablet (250 mg total) by mouth every 6 (six) hours. 28 tablet 0  . IBUPROFEN 200 MG PO TABS Oral Take 600 mg by mouth every 8 (eight) hours as needed. For pain.    . INSULIN GLARGINE 100 UNIT/ML [place] SOLN Subcutaneous Inject 32 Units into the skin at bedtime.    . INSULIN LISPRO (HUMAN) 100 UNIT/ML [place] SOLN  Subcutaneous Inject 6-8 Units into the skin 3 (three) times daily before meals. Per sliding scale    . ISOSORBIDE MONONITRATE ER 120 MG PO TB24 Oral Take 120 mg by mouth daily.    Marland Kitchen LABETALOL HCL 200 MG PO TABS Oral Take 200 mg by mouth 2 (two) times daily.      Marland Kitchen NITROGLYCERIN 0.4 MG SL SUBL Sublingual Place 0.4 mg under the tongue every 5 (five) minutes as needed. For chest pain    . TORSEMIDE 20 MG PO TABS Oral Take 20 mg by mouth 2 (two) times daily.        BP 192/79  Pulse 110  Temp(Src) 101.4 F (38.6 C) (Oral)  Resp 22  Wt 237 lb 14 oz (107.9 kg)  SpO2 96%  LMP 09/23/2011  Physical Exam  Nursing note and vitals reviewed. Constitutional: She appears well-developed and well-nourished. No distress.  HENT:  Head: Normocephalic and atraumatic.  Right Ear: External ear normal.  Left Ear: External ear normal.  Eyes: Conjunctivae are normal. Right eye exhibits no discharge. Left eye exhibits no discharge. No scleral icterus.  Neck: Neck supple. No tracheal deviation present.  Cardiovascular: Normal rate, regular rhythm and intact distal pulses.   Pulmonary/Chest: Effort normal and breath sounds normal. No stridor. No respiratory distress. She has no wheezes. She has no rales.  Abdominal: Soft. Bowel sounds are normal. She exhibits no distension. There is no tenderness. There is no rebound and no guarding.  Genitourinary:       S/p I$d left labia, no drainage  Musculoskeletal: She exhibits no edema and no tenderness.       Av fistula left arm  Neurological: She is alert. She has normal strength. No sensory deficit. Cranial nerve deficit:  no gross defecits noted. She exhibits normal muscle tone. She displays no seizure activity. Coordination normal.  Skin: Skin is warm and dry. No rash noted.  Psychiatric: She has a normal mood and affect.    ED Course  Procedures (including critical care time) Labs from earlier reviewed.   Labs Reviewed  GLUCOSE, CAPILLARY - Abnormal; Notable  for the following:    Glucose-Capillary 396 (*)  All other components within normal limits   No results found.  1. Abscess   2. Fever   3. Renal failure     MDM   Pt was given a dose of IV vancomycin.  She tolerated that well.  Pt 's abscess appears adequately drained.  I attempted to contact the on call nephrologist but did not get a call back.  I was going to re-page the on call physician but the patient and her family want to go home.  Pt encouraged to follow up with her nephrologist tomorrow for dialysis.       Celene Kras, MD 10/14/11 2350

## 2011-10-14 NOTE — Telephone Encounter (Signed)
Pt calling states he has very large abscess that started Friday and now the pt feels very ill with fever. Pt is insulin dependent diabetic. Pt advised to go to ER for eval since his abscess is very large and due to his medical status. Pt advised this sounds to be too large to drain in office and he needs to start antibiotic asap. Pt states he will go to [facility] ER now.

## 2011-10-14 NOTE — ED Notes (Addendum)
Seen at [facility] this am for abcess on inner thigh, drained, given PO antibiotics, was on way home when started having chills and fever symptoms, called dialysis PA- returned to [facility], but was told it was a 4 hour wait. States PA there called PA here and was told it was only an hour wait. Has dialysis graft in left forearm, good bruit felt

## 2011-10-14 NOTE — ED Notes (Signed)
Pt reports an abscess to (L) inner thigh

## 2011-10-14 NOTE — ED Notes (Signed)
MISSED dialysis today

## 2011-10-14 NOTE — Discharge Instructions (Signed)

## 2011-10-14 NOTE — ED Notes (Signed)
Patient undressed and in a gown. 

## 2011-10-14 NOTE — ED Provider Notes (Signed)
Medical screening examination/treatment/procedure(s) were performed by non-physician practitioner and as supervising physician I was immediately available for consultation/collaboration.   Gavin Pound. Octavion Mollenkopf, MD 10/14/11 1306

## 2011-10-14 NOTE — ED Provider Notes (Addendum)
History     CSN: [id number]  Arrival date & time 10/14/11  1025   First MD Initiated Contact with Patient 10/14/11 1142      Chief Complaint  Patient presents with  . Abscess    (Consider location/radiation/quality/duration/timing/severity/associated sxs/prior treatment) HPI  Patient to the ED for evaluation of abscess to left labia. She called her surgeon who said that they could not see the patient because the abscess is too large to drain in the office. The patient tells me it is approximately the size of a golf ball. She says the pain is mild and the rash started a couple of days ago. She has a history of abscesses. The patient denies fevers, chills, nausea diarrhea, vomiting or weakness. She admits to having diabetes and admit that her sugars have been moderately under control. Pt in NAD and VSS.  Past Medical History  Diagnosis Date  . DM (diabetes mellitus)     Long-term insulin  . Hypertension   . Tobacco use disorder     Discontinued March 2012  . Hyperlipidemia     Hypertriglyceridemia 449 HDL 25  . Diabetic Charcot's joint disease   . Coronary artery disease     Status post cardiac catheterization June 2012 scattered coronary artery disease/atherosclerosis with 70-80% stenosis in a small right PDA.  . Gastroparesis   . Peripheral vascular disease     Tibial occlusive disease evaluated by Dr. Hart Rochester in August 2011. Medical therapy  . 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 CIN III (cervical intraepithelial neoplasia grade III) with severe dysplasia     S/P LEEP AND CONE  . Angina   . Chronic anemia     2nd to renal disease  . Iron deficiency anemia   . End stage renal disease on dialysis     On dialysis since March 2012  . End stage renal disease on dialysis 05/02/11    [facility]; M; W, F; last time 05/01/11  . Migraines     "just on dialysis days"  . Peripheral neuropathy     related to DM  . 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 CHF (congestive heart failure)   .  Shortness of breath 05/02/11    "resting"  . History of abscesses in groins 12/06/2010    Past Surgical History  Procedure Date  . Av fistula placement 04/2010  . Dilation and curettage of uterus 2009  . Cervical biopsy  w/ loop electrode excision     h/o  . Cervical cone biopsy     h/o    Family History  Problem Relation Age of Onset  . Diabetes Mother   . Diabetes Father   . Diabetes Sister   . Breast cancer Maternal Aunt   . Cancer Maternal Aunt     breast    History  Substance Use Topics  . Smoking status: Former Smoker -- 0.3 packs/day for 10 years    Types: Cigarettes    Quit date: 05/27/2008  . Smokeless tobacco: Never Used   Comment: "quit smoking cigarettes 07/2010"  . Alcohol Use: No    OB History    Grav Para Term Preterm Abortions TAB SAB Ect Mult Living   2 1   1     1       Review of Systems   HEENT: denies blurry vision or change in hearing PULMONARY: Denies difficulty breathing and SOB CARDIAC: denies chest pain or heart palpitations MUSCULOSKELETAL:  denies being unable to ambulate  ABDOMEN AL: denies abdominal pain GU: denies loss of bowel or urinary control NEURO: denies numbness and tingling in extremities   Allergies  Cephalexin and Sulfamethoxazole w-trimethoprim  Home Medications   Current Outpatient Rx  Name Route Sig Dispense Refill  . AMLODIPINE BESYLATE 10 MG PO TABS Oral Take 10 mg by mouth daily.    . ATORVASTATIN CALCIUM 80 MG PO TABS Oral Take 80 mg by mouth every evening.    Marland Kitchen CALCIUM ACETATE 667 MG PO CAPS Oral Take 2,001 mg by mouth 3 (three) times daily with meals.    Marland Kitchen CARVEDILOL 25 MG PO TABS Oral Take 25 mg by mouth 2 (two) times daily.      Marland Kitchen CLONIDINE HCL 0.1 MG PO TABS Oral Take 0.1 mg by mouth 2 (two) times daily.    Marland Kitchen CLOPIDOGREL BISULFATE 75 MG PO TABS Oral Take 75 mg by mouth daily.    . INSULIN GLARGINE 100 UNIT/ML [place] SOLN Subcutaneous Inject 32 Units into the skin at bedtime.    . INSULIN LISPRO (HUMAN) 100  UNIT/ML [place] SOLN Subcutaneous Inject 6-8 Units into the skin 3 (three) times daily before meals. Per sliding scale    . ISOSORBIDE MONONITRATE ER 120 MG PO TB24 Oral Take 120 mg by mouth daily.    Marland Kitchen LABETALOL HCL 200 MG PO TABS Oral Take 200 mg by mouth 2 (two) times daily.      . TORSEMIDE 20 MG PO TABS Oral Take 20 mg by mouth 2 (two) times daily.      . ERYTHROMYCIN BASE 250 MG PO TABS Oral Take 1 tablet (250 mg total) by mouth every 6 (six) hours. 28 tablet 0  . HYDROCODONE-ACETAMINOPHEN 5-325 MG PO TABS Oral Take 1 tablet by mouth every 4 (four) hours as needed for pain. 10 tablet 0  . NITROGLYCERIN 0.4 MG SL SUBL Sublingual Place 0.4 mg under the tongue every 5 (five) minutes as needed. For chest pain      BP 147/77  Pulse 80  Temp(Src) 98.1 F (36.7 C) (Oral)  Resp 12  SpO2 98%  Physical Exam  Nursing note and vitals reviewed. Constitutional: She appears well-developed and well-nourished. No distress.  HENT:  Head: Normocephalic and atraumatic.  Eyes: Pupils are equal, round, and reactive to light.  Neck: Normal range of motion. Neck supple.  Cardiovascular: Normal rate and regular rhythm.   Pulmonary/Chest: Effort normal.  Abdominal: Soft.  Genitourinary:          approx 2 x 3 cm abscess to left external labia without surrounding cellulitis.  Neurological: She is alert.  Skin: Skin is warm and dry.    ED Course  Procedures (including critical care time)  Labs Reviewed  CBC - Abnormal; Notable for the following:    WBC 12.0 (*)    RBC 2.77 (*)    Hemoglobin 8.5 (*)    HCT 25.5 (*)    All other components within normal limits  BASIC METABOLIC PANEL - Abnormal; Notable for the following:    Glucose, Bld 270 (*)    BUN 69 (*)    Creatinine, Ser 7.83 (*)    GFR calc non Af Amer 6 (*)    GFR calc Af Amer 7 (*)    All other components within normal limits  GLUCOSE, CAPILLARY - Abnormal; Notable for the following:    Glucose-Capillary 273 (*)    All other  components within normal limits   No results found.   1. Abscess  MDM  INCISION AND DRAINAGE Performed by: Dorthula Matas Consent: Verbal consent obtained. Risks and benefits: risks, benefits and alternatives were discussed Type: abscess  Body area: left labia  Anesthesia: local infiltration  Local anesthetic: lidocaine 2 % wo epinephrine  Anesthetic total: 4 ml  Complexity: complex Blunt dissection to break up loculations  Drainage: purulent  Drainage amount: moderate  Packing material: 1/4 in iodoform gauze  Patient tolerance: Patient tolerated the procedure well with no immediate complications.   Patient has appointment to follow up with [facility].  Pt has been advised of the symptoms that warrant their return to the ED. Patient has voiced understanding and has agreed to follow-up with the PCP or specialist.    Patient received 1 L bolus of fluids while in the ED. Her Potassium is WNL. He WBC is slightly elevated and her sugars normally run about 150 are slightly elevated as well. Pt given Rx for Erythromycin antibiotics.  3:33PM- the patient a couple hours after discharged returned to the ED saying that she has been feeling really bad the past couple of days and wants IV antibiotics. She says that she has been vomiting although she did not have any episodes here and that she has been having hot flashes and chills. She informs me that she called her surgeon who says he wants her to have IV antibiotics because she is a diabetic and Dialysis patient. I told her I didn't think they were needed but that she can definitely get them if she wants she would need to check back in the ER. She does not want to wait for a long time in the [facility] waiting room therefore she informs me she will try [facility]. I have spoken with the current attending and [facility] informing them that this patient will most likely be coming to [facility] ER for IV abx.       Dorthula Matas, PA 10/14/11 1259  Dorthula Matas, PA 10/14/11 1536

## 2011-10-14 NOTE — ED Notes (Signed)
To ED for eval of abscess to inside of upper leg. Not draining.

## 2011-10-16 ENCOUNTER — Inpatient Hospital Stay (HOSPITAL_COMMUNITY): Payer: 59

## 2011-10-16 ENCOUNTER — Encounter (HOSPITAL_COMMUNITY): Payer: Self-pay | Admitting: Emergency Medicine

## 2011-10-16 ENCOUNTER — Emergency Department (HOSPITAL_COMMUNITY): Payer: 59

## 2011-10-16 ENCOUNTER — Inpatient Hospital Stay (HOSPITAL_COMMUNITY)
Admission: EM | Admit: 2011-10-16 | Discharge: 2011-10-17 | DRG: 757 | Disposition: A | Discharge status: Home / Self Care | Payer: 59 | Attending: Internal Medicine | Admitting: Internal Medicine

## 2011-10-16 DIAGNOSIS — N039 Chronic nephritic syndrome with unspecified morphologic changes: Secondary | ICD-10-CM | POA: Diagnosis present

## 2011-10-16 DIAGNOSIS — I120 Hypertensive chronic kidney disease with stage 5 chronic kidney disease or end stage renal disease: Secondary | ICD-10-CM | POA: Diagnosis present

## 2011-10-16 DIAGNOSIS — R1110 Vomiting, unspecified: Secondary | ICD-10-CM

## 2011-10-16 DIAGNOSIS — F17200 Nicotine dependence, unspecified, uncomplicated: Secondary | ICD-10-CM | POA: Diagnosis present

## 2011-10-16 DIAGNOSIS — N186 End stage renal disease: Secondary | ICD-10-CM | POA: Diagnosis present

## 2011-10-16 DIAGNOSIS — E1149 Type 2 diabetes mellitus with other diabetic neurological complication: Secondary | ICD-10-CM | POA: Diagnosis present

## 2011-10-16 DIAGNOSIS — D649 Anemia, unspecified: Secondary | ICD-10-CM | POA: Diagnosis present

## 2011-10-16 DIAGNOSIS — I739 Peripheral vascular disease, unspecified: Secondary | ICD-10-CM | POA: Diagnosis present

## 2011-10-16 DIAGNOSIS — I2510 Atherosclerotic heart disease of native coronary artery without angina pectoris: Secondary | ICD-10-CM | POA: Diagnosis present

## 2011-10-16 DIAGNOSIS — E782 Mixed hyperlipidemia: Secondary | ICD-10-CM | POA: Diagnosis present

## 2011-10-16 DIAGNOSIS — G909 Disorder of the autonomic nervous system, unspecified: Secondary | ICD-10-CM | POA: Diagnosis present

## 2011-10-16 DIAGNOSIS — N764 Abscess of vulva: Principal | ICD-10-CM | POA: Diagnosis present

## 2011-10-16 DIAGNOSIS — Z794 Long term (current) use of insulin: Secondary | ICD-10-CM

## 2011-10-16 DIAGNOSIS — Z992 Dependence on renal dialysis: Secondary | ICD-10-CM

## 2011-10-16 DIAGNOSIS — I10 Essential (primary) hypertension: Secondary | ICD-10-CM | POA: Diagnosis present

## 2011-10-16 DIAGNOSIS — D631 Anemia in chronic kidney disease: Secondary | ICD-10-CM | POA: Diagnosis present

## 2011-10-16 DIAGNOSIS — E109 Type 1 diabetes mellitus without complications: Secondary | ICD-10-CM

## 2011-10-16 DIAGNOSIS — M899 Disorder of bone, unspecified: Secondary | ICD-10-CM | POA: Diagnosis present

## 2011-10-16 DIAGNOSIS — R112 Nausea with vomiting, unspecified: Secondary | ICD-10-CM | POA: Diagnosis present

## 2011-10-16 DIAGNOSIS — E785 Hyperlipidemia, unspecified: Secondary | ICD-10-CM | POA: Diagnosis present

## 2011-10-16 DIAGNOSIS — Z872 Personal history of diseases of the skin and subcutaneous tissue: Secondary | ICD-10-CM

## 2011-10-16 LAB — GLUCOSE, CAPILLARY
Glucose-Capillary: 427 mg/dL — ABNORMAL HIGH (ref 70–99)
Glucose-Capillary: 350 mg/dL — ABNORMAL HIGH (ref 70–99)

## 2011-10-16 LAB — DIFFERENTIAL
Eosinophils Relative: 3 % (ref 0–5)
Lymphocytes Relative: 10 % — ABNORMAL LOW (ref 12–46)
Lymphs Abs: 0.9 10*3/uL (ref 0.7–4.0)
Monocytes Relative: 12 % (ref 3–12)

## 2011-10-16 LAB — BASIC METABOLIC PANEL
BUN: 58 mg/dL — ABNORMAL HIGH (ref 6–23)
CO2: 22 mEq/L (ref 19–32)
Calcium: 9.2 mg/dL (ref 8.4–10.5)
Glucose, Bld: 483 mg/dL — ABNORMAL HIGH (ref 70–99)
Potassium: 4.4 mEq/L (ref 3.5–5.1)
Sodium: 136 mEq/L (ref 135–145)

## 2011-10-16 LAB — MAGNESIUM: Magnesium: 1.9 mg/dL (ref 1.5–2.5)

## 2011-10-16 LAB — CBC
Hemoglobin: 8 g/dL — ABNORMAL LOW (ref 12.0–15.0)
MCV: 92.3 fL (ref 78.0–100.0)
Platelets: 173 10*3/uL (ref 150–400)
RBC: 2.59 MIL/uL — ABNORMAL LOW (ref 3.87–5.11)
WBC: 8.6 10*3/uL (ref 4.0–10.5)

## 2011-10-16 LAB — PHOSPHORUS: Phosphorus: 5.9 mg/dL — ABNORMAL HIGH (ref 2.3–4.6)

## 2011-10-16 LAB — TSH: TSH: 0.585 u[IU]/mL (ref 0.350–4.500)

## 2011-10-16 MED ORDER — CALCIUM ACETATE 667 MG PO CAPS
2001.0000 mg | ORAL_CAPSULE | Freq: Three times a day (TID) | ORAL | Status: DC
  Administered 2011-10-17: 2001 mg via ORAL
  Filled 2011-10-16 (×6): qty 3

## 2011-10-16 MED ORDER — SODIUM CHLORIDE 0.9 % IV SOLN: 250.0000 mL | INTRAVENOUS | Status: DC | PRN

## 2011-10-16 MED ORDER — CLONIDINE HCL 0.1 MG PO TABS
0.1000 mg | ORAL_TABLET | Freq: Two times a day (BID) | ORAL | Status: DC
  Administered 2011-10-16 – 2011-10-17 (×2): 0.1 mg via ORAL
  Filled 2011-10-16 (×4): qty 1

## 2011-10-16 MED ORDER — ATORVASTATIN CALCIUM 80 MG PO TABS
80.0000 mg | ORAL_TABLET | Freq: Every evening | ORAL | Status: DC
  Administered 2011-10-16: 80 mg via ORAL
  Filled 2011-10-16 (×2): qty 1

## 2011-10-16 MED ORDER — HEPARIN SODIUM (PORCINE) 5000 UNIT/ML IJ SOLN
5000.0000 [IU] | Freq: Three times a day (TID) | INTRAMUSCULAR | Status: DC
  Administered 2011-10-16 – 2011-10-17 (×2): 5000 [IU] via SUBCUTANEOUS
  Filled 2011-10-16 (×6): qty 1

## 2011-10-16 MED ORDER — INSULIN GLARGINE 100 UNIT/ML ~~LOC~~ SOLN
32.0000 [IU] | Freq: Every day | SUBCUTANEOUS | Status: DC
  Administered 2011-10-16: 32 [IU] via SUBCUTANEOUS

## 2011-10-16 MED ORDER — VANCOMYCIN HCL 1000 MG IV SOLR
1500.0000 mg | Freq: Once | INTRAVENOUS | Status: DC
  Filled 2011-10-16: qty 1500

## 2011-10-16 MED ORDER — NITROGLYCERIN 0.4 MG SL SUBL: 0.4000 mg | SUBLINGUAL_TABLET | SUBLINGUAL | Status: DC | PRN

## 2011-10-16 MED ORDER — SODIUM CHLORIDE 0.9 % IV SOLN
Freq: Once | INTRAVENOUS | Status: AC
  Administered 2011-10-16: 07:00:00 via INTRAVENOUS

## 2011-10-16 MED ORDER — SODIUM CHLORIDE 0.9 % IJ SOLN
3.0000 mL | Freq: Two times a day (BID) | INTRAMUSCULAR | Status: DC
  Administered 2011-10-16 – 2011-10-17 (×3): 3 mL via INTRAVENOUS

## 2011-10-16 MED ORDER — VANCOMYCIN HCL 1000 MG IV SOLR
1500.0000 mg | Freq: Once | INTRAVENOUS | Status: AC
  Administered 2011-10-16: 1500 mg via INTRAVENOUS
  Filled 2011-10-16 (×3): qty 1500

## 2011-10-16 MED ORDER — LABETALOL HCL 200 MG PO TABS
200.0000 mg | ORAL_TABLET | Freq: Two times a day (BID) | ORAL | Status: DC
  Filled 2011-10-16 (×2): qty 1

## 2011-10-16 MED ORDER — LABETALOL HCL 200 MG PO TABS
200.0000 mg | ORAL_TABLET | Freq: Two times a day (BID) | ORAL | Status: DC
  Administered 2011-10-16 – 2011-10-17 (×2): 200 mg via ORAL
  Filled 2011-10-16 (×3): qty 1

## 2011-10-16 MED ORDER — PARICALCITOL 5 MCG/ML IV SOLN
INTRAVENOUS | Status: AC
  Administered 2011-10-16: 9 ug via INTRAVENOUS
  Filled 2011-10-16: qty 2

## 2011-10-16 MED ORDER — ONDANSETRON HCL 4 MG/2ML IJ SOLN
4.0000 mg | Freq: Once | INTRAMUSCULAR | Status: AC
  Administered 2011-10-16: 4 mg via INTRAVENOUS
  Filled 2011-10-16: qty 2

## 2011-10-16 MED ORDER — AMLODIPINE BESYLATE 10 MG PO TABS
10.0000 mg | ORAL_TABLET | Freq: Every day | ORAL | Status: DC
  Administered 2011-10-17: 10 mg via ORAL
  Filled 2011-10-16 (×2): qty 1

## 2011-10-16 MED ORDER — ONDANSETRON HCL 4 MG/2ML IJ SOLN: 4.0000 mg | Freq: Three times a day (TID) | INTRAMUSCULAR | Status: DC | PRN

## 2011-10-16 MED ORDER — SODIUM CHLORIDE 0.9 % IV SOLN: 125.0000 mg | INTRAVENOUS | Status: DC

## 2011-10-16 MED ORDER — ONDANSETRON HCL 4 MG PO TABS: 4.0000 mg | ORAL_TABLET | Freq: Four times a day (QID) | ORAL | Status: DC | PRN

## 2011-10-16 MED ORDER — PARICALCITOL 5 MCG/ML IV SOLN
9.0000 ug | INTRAVENOUS | Status: DC
  Administered 2011-10-16: 9 ug via INTRAVENOUS
  Filled 2011-10-16 (×2): qty 1.8

## 2011-10-16 MED ORDER — SODIUM CHLORIDE 0.9 % IJ SOLN: 3.0000 mL | INTRAMUSCULAR | Status: DC | PRN

## 2011-10-16 MED ORDER — ONDANSETRON HCL 4 MG/2ML IJ SOLN: 4.0000 mg | Freq: Four times a day (QID) | INTRAMUSCULAR | Status: DC | PRN

## 2011-10-16 MED ORDER — ISOSORBIDE MONONITRATE ER 60 MG PO TB24
120.0000 mg | ORAL_TABLET | Freq: Every day | ORAL | Status: DC
Start: 2011-10-16 — End: 2011-10-16

## 2011-10-16 MED ORDER — DARBEPOETIN ALFA-POLYSORBATE 100 MCG/0.5ML IJ SOLN: 100.0000 ug | INTRAMUSCULAR | Status: DC

## 2011-10-16 MED ORDER — IBUPROFEN 600 MG PO TABS
600.0000 mg | ORAL_TABLET | Freq: Three times a day (TID) | ORAL | Status: DC | PRN
  Administered 2011-10-16 (×2): 600 mg via ORAL
  Filled 2011-10-16 (×3): qty 1

## 2011-10-16 MED ORDER — DARBEPOETIN ALFA-POLYSORBATE 100 MCG/0.5ML IJ SOLN
100.0000 ug | INTRAMUSCULAR | Status: DC
  Administered 2011-10-16: 100 ug via INTRAVENOUS
  Filled 2011-10-16: qty 0.5

## 2011-10-16 MED ORDER — DARBEPOETIN ALFA-POLYSORBATE 100 MCG/0.5ML IJ SOLN
INTRAMUSCULAR | Status: AC
  Administered 2011-10-16: 100 ug via INTRAVENOUS
  Filled 2011-10-16: qty 0.5

## 2011-10-16 MED ORDER — CLOPIDOGREL BISULFATE 75 MG PO TABS
75.0000 mg | ORAL_TABLET | Freq: Every day | ORAL | Status: DC
  Administered 2011-10-16 – 2011-10-17 (×2): 75 mg via ORAL
  Filled 2011-10-16 (×2): qty 1

## 2011-10-16 MED ORDER — INSULIN ASPART 100 UNIT/ML ~~LOC~~ SOLN
0.0000 [IU] | SUBCUTANEOUS | Status: DC
  Administered 2011-10-16: 7 [IU] via SUBCUTANEOUS
  Administered 2011-10-16 – 2011-10-17 (×2): 2 [IU] via SUBCUTANEOUS
  Administered 2011-10-17: 1 [IU] via SUBCUTANEOUS

## 2011-10-16 MED ORDER — ISOSORBIDE MONONITRATE ER 60 MG PO TB24
120.0000 mg | ORAL_TABLET | Freq: Every day | ORAL | Status: DC
  Administered 2011-10-16 – 2011-10-17 (×2): 120 mg via ORAL
  Filled 2011-10-16 (×2): qty 2

## 2011-10-16 MED ORDER — CARVEDILOL 25 MG PO TABS
25.0000 mg | ORAL_TABLET | Freq: Two times a day (BID) | ORAL | Status: DC
  Administered 2011-10-16 – 2011-10-17 (×2): 25 mg via ORAL
  Filled 2011-10-16 (×4): qty 1

## 2011-10-16 MED ORDER — INSULIN GLARGINE 100 UNIT/ML ~~LOC~~ SOLN
10.0000 [IU] | Freq: Every day | SUBCUTANEOUS | Status: DC
  Administered 2011-10-16: 10 [IU] via SUBCUTANEOUS
  Filled 2011-10-16: qty 1

## 2011-10-16 MED ORDER — SENNOSIDES-DOCUSATE SODIUM 8.6-50 MG PO TABS
2.0000 | ORAL_TABLET | Freq: Two times a day (BID) | ORAL | Status: DC
  Administered 2011-10-17: 2 via ORAL
  Filled 2011-10-16 (×4): qty 2

## 2011-10-16 MED ORDER — ERYTHROMYCIN BASE 250 MG PO TABS
250.0000 mg | ORAL_TABLET | Freq: Four times a day (QID) | ORAL | Status: DC
Start: 2011-10-16 — End: 2011-10-16
  Filled 2011-10-16 (×4): qty 1

## 2011-10-16 NOTE — ED Notes (Signed)
CBG [phone], notified RN

## 2011-10-16 NOTE — Progress Notes (Addendum)
ANTIBIOTIC CONSULT NOTE - INITIAL  Pharmacy Consult for Vancomycin Indication: cellulitis - left labial area  Allergies  Allergen Reactions  . Cephalexin     REACTION: ?dz at young age  . Sulfamethoxazole W-Trimethoprim     REACTION: thrush    Patient Measurements: Height: 6\' 4"  (193 cm) Weight: 230 lb (104.327 kg) IBW/kg (Calculated) : 82.3   Vital Signs: Temp: 98.7 F (37.1 C) (05/22 0651) Temp src: Oral (05/22 0651) BP: 156/71 mmHg (05/22 0651) Pulse Rate: 90  (05/22 0651)  Labs:  Basename 10/16/11 0732 10/14/11 1147  WBC 8.6 12.0*  HGB 8.0* 8.5*  PLT 173 176  LABCREA -- --  CREATININE 6.75* 7.83*   ESRD - usual MWF dialysis  Microbiology:   Blood cultures drawn in ED this morning.   S/p I & D of labial abscess in ED on 5/20.   Purulent drainage noted, but no cultures found.    Medical History: Past Medical History  Diagnosis Date  . DM (diabetes mellitus)     Long-term insulin  . Hypertension   . Tobacco use disorder     Discontinued March 2012  . Hyperlipidemia     Hypertriglyceridemia 449 HDL 25  . Diabetic Charcot's joint disease   . Coronary artery disease     Status post cardiac catheterization June 2012 scattered coronary artery disease/atherosclerosis with 70-80% stenosis in a small right PDA.  . Gastroparesis   . Peripheral vascular disease     Tibial occlusive disease evaluated by Dr. Hart Rochester in August 2011. Medical therapy  . 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 CIN III (cervical intraepithelial neoplasia grade III) with severe dysplasia     S/P LEEP AND CONE  . Angina   . Chronic anemia     2nd to renal disease  . Iron deficiency anemia   . End stage renal disease on dialysis     On dialysis since March 2012  . End stage renal disease on dialysis 05/02/11    [facility]; M; W, F; last time 05/01/11  . Migraines     "just on dialysis days"  . Peripheral neuropathy     related to DM  . 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 CHF (congestive  heart failure)   . Shortness of breath 05/02/11    "resting"  . History of abscesses in groins 12/06/2010   Assessment:   Labial abscess drained in ED on 5/20.   Received Vancomycin 1 gram IV 5/20 pm.    Missed usual MWF dialysis on 5/20.  Reports short session 5/21.  Renal to see.   Likely minimal vancomycin on board, as expected peak level from 1 gram IV dose only ~12-15 mcg/ml, and some Vanc removed with short HD session yesterday.  Unable to add random Vanc level to blood already drawn in ED.   She has been on oral Erythromycin. Vomiting noted, but she reports that it was present before taking Erythromcyin.  Goal of Therapy:    Pre-dialysis Vancomycin levels 15-25 mcg/ml  Plan:    Vancomycin 1500 mg IV today.   Follow up culture data and progress.   Will follow up for dialysis plans and re-dose as needed.  Dennie Fetters, [place] Pager: [phone] 10/16/2011,11:29 AM  Addendum:  Pt. Will receive HD this afternoon. HD nurse called, 40% of the first vancomycin dose (~ 600mg ) was given prior to HD.   Plan:  - Will order another dose of 1500 mg to be given after HD  to ensure sufficient load - Will continue f/u HD schedule and tolerance.  Bayard Hugger, PharmD, BCPS  Clinical Pharmacist  Pager: [phone]

## 2011-10-16 NOTE — ED Notes (Signed)
Pt given diet ginger ale to drink. No further needs. States nausea improved.

## 2011-10-16 NOTE — Consult Note (Signed)
[facility] Renal Consultation Note  Indication for Consultation:  Management of ESRD/hemodialysis; anemia, hypertension/volume and secondary hyperparathyroidism  HPI: [name] is a 36 y.o. female admitted with recurrent nausea and vomiting ,hyperglycemia,=483 with ho Diabetes Mellitus Type 1, fever/ chills at home with recent Labial Abscess ID at  [facility] ER Monday 10/14/11. On chronic hemodialysis MWF , missed Monday hd , yesterday only 2 of 4hrs treatment time.No pain or infection reported at her AVF site.   Now in room requesting something to eat, given Zofran in ER.     Past Medical History  Diagnosis Date  . DM (diabetes mellitus)     Long-term insulin  . Hypertension   . Tobacco use disorder     Discontinued March 2012  . Hyperlipidemia     Hypertriglyceridemia 449 HDL 25  . Diabetic Charcot's joint disease   . Coronary artery disease     Status post cardiac catheterization June 2012 scattered coronary artery disease/atherosclerosis with 70-80% stenosis in a small right PDA.  . Gastroparesis   . Peripheral vascular disease     Tibial occlusive disease evaluated by Dr. Hart Rochester in August 2011. Medical therapy  . 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 CIN III (cervical intraepithelial neoplasia grade III) with severe dysplasia     S/P LEEP AND CONE  . Angina   . Chronic anemia     2nd to renal disease  . Iron deficiency anemia   . End stage renal disease on dialysis     On dialysis since March 2012  . End stage renal disease on dialysis 05/02/11    [facility]; M; W, F; last time 05/01/11  . Migraines     "just on dialysis days"  . Peripheral neuropathy     related to DM  . 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 CHF (congestive heart failure)   . Shortness of breath 05/02/11    "resting"  . History of abscesses in groins 12/06/2010    Past Surgical History  Procedure Date  . Av fistula placement 04/2010  . Dilation and curettage of uterus 2009  . Cervical  biopsy  w/ loop electrode excision     h/o  . Cervical cone biopsy     h/o      Family History  Problem Relation Age of Onset  . Diabetes Mother   . Diabetes Father   . Diabetes Sister   . Breast cancer Maternal Aunt   . Cancer Maternal Aunt     breast      reports that she quit smoking about 3 years ago. Her smoking use included Cigarettes. She has a 3 pack-year smoking history. She has never used smokeless tobacco. She reports that she does not drink alcohol or use illicit drugs.   Allergies  Allergen Reactions  . Cephalexin     REACTION: ?dz at young age  . Sulfamethoxazole W-Trimethoprim     REACTION: thrush    Prior to Admission medications   Medication Sig Start Date End Date Taking? Authorizing Provider  amLODipine (NORVASC) 10 MG tablet Take 10 mg by mouth daily.   Yes Historical Provider, MD  atorvastatin (LIPITOR) 80 MG tablet Take 80 mg by mouth every evening.   Yes Historical Provider, MD  calcium acetate (PHOSLO) 667 MG capsule Take 2,001 mg by mouth 3 (three) times daily with meals.   Yes Historical Provider, MD  carvedilol (COREG) 25 MG tablet Take 25 mg by mouth 2 (two) times  daily.     Yes Historical Provider, MD  cloNIDine (CATAPRES) 0.1 MG tablet Take 0.1 mg by mouth 2 (two) times daily.   Yes Historical Provider, MD  clopidogrel (PLAVIX) 75 MG tablet Take 75 mg by mouth daily.   Yes Historical Provider, MD  erythromycin (E-MYCIN) 250 MG tablet Take 1 tablet (250 mg total) by mouth every 6 (six) hours. 10/14/11 10/24/11 Yes Tiffany Irine Seal, PA  ibuprofen (ADVIL,MOTRIN) 200 MG tablet Take 600 mg by mouth every 8 (eight) hours as needed. For pain.   Yes Historical Provider, MD  insulin glargine (LANTUS) 100 UNIT/ML injection Inject 32 Units into the skin at bedtime.   Yes Historical Provider, MD  insulin lispro (HUMALOG) 100 UNIT/ML injection Inject 6-8 Units into the skin 3 (three) times daily before meals. Per sliding scale   Yes Historical Provider, MD    isosorbide mononitrate (IMDUR) 120 MG 24 hr tablet Take 120 mg by mouth daily.   Yes Historical Provider, MD  labetalol (NORMODYNE) 200 MG tablet Take 200 mg by mouth 2 (two) times daily.     Yes Historical Provider, MD  nitroGLYCERIN (NITROSTAT) 0.4 MG SL tablet Place 0.4 mg under the tongue every 5 (five) minutes as needed. For chest pain   Yes Historical Provider, MD  torsemide (DEMADEX) 20 MG tablet Take 20 mg by mouth 2 (two) times daily.     Yes Historical Provider, MD     Anti-infectives     Start     Dose/Rate Route Frequency Ordered Stop   10/16/11 1200   erythromycin (E-MYCIN) tablet 250 mg  Status:  Discontinued        250 mg Oral Every 6 hours 10/16/11 1128 10/16/11 1202   10/16/11 1200   vancomycin (VANCOCIN) 1,500 mg in sodium chloride 0.9 % 500 mL IVPB        1,500 mg 250 mL/hr over 120 Minutes Intravenous  Once 10/16/11 1129            Results for orders placed during the hospital encounter of 10/16/11 (from the past 48 hour(s))  GLUCOSE, CAPILLARY     Status: Abnormal   Collection Time   10/16/11  7:16 AM      Component Value Range Comment   Glucose-Capillary 446 (*) 70 - 99 (mg/dL)    Comment 1 Notify RN     CBC     Status: Abnormal   Collection Time   10/16/11  7:32 AM      Component Value Range Comment   WBC 8.6  4.0 - 10.5 (K/uL)    RBC 2.59 (*) 3.87 - 5.11 (MIL/uL)    Hemoglobin 8.0 (*) 12.0 - 15.0 (g/dL)    HCT 16.1 (*) 09.6 - 46.0 (%)    MCV 92.3  78.0 - 100.0 (fL)    MCH 30.9  26.0 - 34.0 (pg)    MCHC 33.5  30.0 - 36.0 (g/dL)    RDW 04.5  40.9 - 81.1 (%)    Platelets 173  150 - 400 (K/uL)   DIFFERENTIAL     Status: Abnormal   Collection Time   10/16/11  7:32 AM      Component Value Range Comment   Neutrophils Relative 75  43 - 77 (%)    Neutro Abs 6.5  1.7 - 7.7 (K/uL)    Lymphocytes Relative 10 (*) 12 - 46 (%)    Lymphs Abs 0.9  0.7 - 4.0 (K/uL)    Monocytes Relative 12  3 -  12 (%)    Monocytes Absolute 1.0  0.1 - 1.0 (K/uL)    Eosinophils  Relative 3  0 - 5 (%)    Eosinophils Absolute 0.2  0.0 - 0.7 (K/uL)    Basophils Relative 0  0 - 1 (%)    Basophils Absolute 0.0  0.0 - 0.1 (K/uL)   BASIC METABOLIC PANEL     Status: Abnormal   Collection Time   10/16/11  7:32 AM      Component Value Range Comment   Sodium 136  135 - 145 (mEq/L)    Potassium 4.4  3.5 - 5.1 (mEq/L)    Chloride 98  96 - 112 (mEq/L)    CO2 22  19 - 32 (mEq/L)    Glucose, Bld 483 (*) 70 - 99 (mg/dL)    BUN 58 (*) 6 - 23 (mg/dL)    Creatinine, Ser 1.61 (*) 0.50 - 1.10 (mg/dL)    Calcium 9.2  8.4 - 10.5 (mg/dL)    GFR calc non Af Amer 7 (*) >90 (mL/min)    GFR calc Af Amer 8 (*) >90 (mL/min)   GLUCOSE, CAPILLARY     Status: Abnormal   Collection Time   10/16/11  8:45 AM      Component Value Range Comment   Glucose-Capillary 427 (*) 70 - 99 (mg/dL)    Comment 1 Notify RN     MAGNESIUM     Status: Normal   Collection Time   10/16/11 10:21 AM      Component Value Range Comment   Magnesium 1.9  1.5 - 2.5 (mg/dL)   PHOSPHORUS     Status: Abnormal   Collection Time   10/16/11 10:21 AM      Component Value Range Comment   Phosphorus 5.9 (*) 2.3 - 4.6 (mg/dL)    EKG: normal EKG, normal sinus rhythm, unchanged from previous tracings, atrial fibrillation, rate   ROS: max. Temperature at  Home 100 to 100.9/chills, sweats, no sob or chest pain. No constipation or diarrhea. No pain in joints or back and no reported ulcers on skin.   Physical Exam: Filed Vitals:   10/16/11 0651  BP: 156/71  Pulse: 90  Temp: 98.7 F (37.1 C)  Resp: 18     General: Alert, young tall WF, NAD, appropriate HEENT: [place], nonicteric, MMM Neck: supple, no jvd or bruits Heart: RRR, no rub, murmur, or gallop Lungs: Clear too auscultation bilaterally, no rales , wheezing Abdomen: bs+=, soft, nontender Extremities: no pedal edema Skin: warm dry,no rash or pedal ulcers Neuro: ox3, no acute focal deficits Dialysis Access:  Positive bruit L FA AVF  Dialysis Orders: [facility]: [facility]  on  mwf . EDW 103.5kg HD [place] 2.25, K2  Time 4 Heparin 3000. Access L UA AVF BFR 450 DFR 800    Zemplar 9 mcg IV/HD Epogen 1600   Units IV/HD  Venofer  50 mg wkly hd  Other zone  Protein bar  On hd  Assessment/Plan 1. ESRD - HD [facility] 2. N/V=? gastroparesis exacerbation with febrile /infectious illness= admit team  3. Recent Labial Abscess = abcess present for 5 days, I&D'd 2 days ago in ED.  4. IDDM Type 1 / hyperglycemia= per Admit team 5. Hypertension/volume  - bp 156/71, wt . 104.3 kg and edw 103.5 attempt only 1.5 l uf with hd with nv, normal out pt meds hold for sbp less 110, tells me she takes all at homeLabetolol200mg  bid, Norvasc  10mg  hs, Clonidine 0.1mg  bid, Isosorbide 120mg  qd, Carvedilol 25mg  bid 6. Anemia  - hgb 8.0 / Aranesp in hospital / was on 1600 u epo and op hgs 9.8 10/09/11/  Was on  Weekly  iron 7. Metabolic bone disease -  phoslo when taking pos and zemplar continue on hd. 8. Nutrition - high protein renal ,carbmod. Diet when eating.  Lenny Pastel, PA-C [facility] Beeper [phone] 10/16/2011, 12:00 PM   Patient seen and examined and agree with assessment and plan as above. 36 yo with type 1 DM and ESRD on hemodialysis, presenting with N/V and subjective fevers. Had an I&D of a labial abcess done 2 days ago in ED. Admitted for IV abx and BS control. Plan dialysis today upstairs.  Will follow.   Vinson Moselle  MD [employer] [phone] pgr    [phone] cell 10/16/2011, 5:05 PM

## 2011-10-16 NOTE — ED Notes (Signed)
PT. REPORTS VOMITTING THIS MORNING , CURRENTLY TAKING ANTIBIOTIC FOR LEFT UPPER INNER THIGH ABSCESS - INCISED AND DRAINED /PACKED LAST Friday.

## 2011-10-16 NOTE — ED Provider Notes (Addendum)
History     CSN: [id number]  Arrival date & time 10/16/11  4098   First MD Initiated Contact with Patient 10/16/11 0703      Chief Complaint  Patient presents with  . Emesis    (Consider location/radiation/quality/duration/timing/severity/associated sxs/prior treatment) Patient is a 36 y.o. female presenting with vomiting. The history is provided by the patient (pt complains of vomiting). No language interpreter was used.  Emesis  This is a recurrent problem. The current episode started more than 2 days ago. The problem occurs 5 to 10 times per day. The problem has not changed since onset.The emesis has an appearance of stomach contents. The maximum temperature recorded prior to her arrival was 100 to 100.9 F. The fever has been present for 1 to 2 days. Associated symptoms include chills. Pertinent negatives include no abdominal pain, no cough, no diarrhea, no fever and no headaches.    Past Medical History  Diagnosis Date  . DM (diabetes mellitus)     Long-term insulin  . Hypertension   . Tobacco use disorder     Discontinued March 2012  . Hyperlipidemia     Hypertriglyceridemia 449 HDL 25  . Diabetic Charcot's joint disease   . Coronary artery disease     Status post cardiac catheterization June 2012 scattered coronary artery disease/atherosclerosis with 70-80% stenosis in a small right PDA.  . Gastroparesis   . Peripheral vascular disease     Tibial occlusive disease evaluated by Dr. Hart Rochester in August 2011. Medical therapy  . 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 CIN III (cervical intraepithelial neoplasia grade III) with severe dysplasia     S/P LEEP AND CONE  . Angina   . Chronic anemia     2nd to renal disease  . Iron deficiency anemia   . End stage renal disease on dialysis     On dialysis since March 2012  . End stage renal disease on dialysis 05/02/11    [facility]; M; W, F; last time 05/01/11  . Migraines     "just on dialysis days"  . Peripheral neuropathy     related to DM   . 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 CHF (congestive heart failure)   . Shortness of breath 05/02/11    "resting"  . History of abscesses in groins 12/06/2010    Past Surgical History  Procedure Date  . Av fistula placement 04/2010  . Dilation and curettage of uterus 2009  . Cervical biopsy  w/ loop electrode excision     h/o  . Cervical cone biopsy     h/o    Family History  Problem Relation Age of Onset  . Diabetes Mother   . Diabetes Father   . Diabetes Sister   . Breast cancer Maternal Aunt   . Cancer Maternal Aunt     breast    History  Substance Use Topics  . Smoking status: Former Smoker -- 0.3 packs/day for 10 years    Types: Cigarettes    Quit date: 05/27/2008  . Smokeless tobacco: Never Used   Comment: "quit smoking cigarettes 07/2010"  . Alcohol Use: No    OB History    Grav Para Term Preterm Abortions TAB SAB Ect Mult Living   2 1   1     1       Review of Systems  Constitutional: Positive for chills. Negative for fever and fatigue.  HENT: Negative for congestion, sinus pressure and ear discharge.  Eyes: Negative for discharge.  Respiratory: Negative for cough.   Cardiovascular: Negative for chest pain.  Gastrointestinal: Positive for vomiting. Negative for abdominal pain and diarrhea.  Genitourinary: Negative for frequency and hematuria.  Musculoskeletal: Negative for back pain.  Skin: Negative for rash.  Neurological: Negative for seizures and headaches.  Hematological: Negative.   Psychiatric/Behavioral: Negative for hallucinations.    Allergies  Cephalexin and Sulfamethoxazole w-trimethoprim  Home Medications   Current Outpatient Rx  Name Route Sig Dispense Refill  . AMLODIPINE BESYLATE 10 MG PO TABS Oral Take 10 mg by mouth daily.    . ATORVASTATIN CALCIUM 80 MG PO TABS Oral Take 80 mg by mouth every evening.    Marland Kitchen CALCIUM ACETATE 667 MG PO CAPS Oral Take 2,001 mg by mouth 3 (three) times daily with meals.    Marland Kitchen CARVEDILOL  25 MG PO TABS Oral Take 25 mg by mouth 2 (two) times daily.      Marland Kitchen CLONIDINE HCL 0.1 MG PO TABS Oral Take 0.1 mg by mouth 2 (two) times daily.    Marland Kitchen CLOPIDOGREL BISULFATE 75 MG PO TABS Oral Take 75 mg by mouth daily.    . ERYTHROMYCIN BASE 250 MG PO TABS Oral Take 1 tablet (250 mg total) by mouth every 6 (six) hours. 28 tablet 0  . IBUPROFEN 200 MG PO TABS Oral Take 600 mg by mouth every 8 (eight) hours as needed. For pain.    . INSULIN GLARGINE 100 UNIT/ML [place] SOLN Subcutaneous Inject 32 Units into the skin at bedtime.    . INSULIN LISPRO (HUMAN) 100 UNIT/ML [place] SOLN Subcutaneous Inject 6-8 Units into the skin 3 (three) times daily before meals. Per sliding scale    . ISOSORBIDE MONONITRATE ER 120 MG PO TB24 Oral Take 120 mg by mouth daily.    Marland Kitchen LABETALOL HCL 200 MG PO TABS Oral Take 200 mg by mouth 2 (two) times daily.      Marland Kitchen NITROGLYCERIN 0.4 MG SL SUBL Sublingual Place 0.4 mg under the tongue every 5 (five) minutes as needed. For chest pain    . TORSEMIDE 20 MG PO TABS Oral Take 20 mg by mouth 2 (two) times daily.        BP 156/71  Pulse 90  Temp(Src) 98.7 F (37.1 C) (Oral)  Resp 18  SpO2 95%  LMP 09/23/2011  Physical Exam  Constitutional: She is oriented to person, place, and time. She appears well-developed.  HENT:  Head: Normocephalic and atraumatic.  Eyes: Conjunctivae and EOM are normal. No scleral icterus.  Neck: Neck supple. No thyromegaly present.  Cardiovascular: Normal rate and regular rhythm.  Exam reveals no gallop and no friction rub.   No murmur heard. Pulmonary/Chest: No stridor. She has no wheezes. She has no rales. She exhibits no tenderness.  Abdominal: She exhibits no distension. There is no tenderness. There is no rebound.  Musculoskeletal: Normal range of motion. She exhibits no edema.  Lymphadenopathy:    She has no cervical adenopathy.  Neurological: She is oriented to person, place, and time. Coordination normal.  Skin: No rash noted. No erythema.    Psychiatric: She has a normal mood and affect. Her behavior is normal.    ED Course  Procedures (including critical care time)  Labs Reviewed  CBC - Abnormal; Notable for the following:    RBC 2.59 (*)    Hemoglobin 8.0 (*)    HCT 23.9 (*)    All other components within normal limits  DIFFERENTIAL - Abnormal;  Notable for the following:    Lymphocytes Relative 10 (*)    All other components within normal limits  BASIC METABOLIC PANEL - Abnormal; Notable for the following:    Glucose, Bld 483 (*)    BUN 58 (*)    Creatinine, Ser 6.75 (*)    GFR calc non Af Amer 7 (*)    GFR calc Af Amer 8 (*)    All other components within normal limits  GLUCOSE, CAPILLARY - Abnormal; Notable for the following:    Glucose-Capillary 446 (*)    All other components within normal limits  GLUCOSE, CAPILLARY - Abnormal; Notable for the following:    Glucose-Capillary 427 (*)    All other components within normal limits   Dg Abd Acute W/chest  10/16/2011  *RADIOLOGY REPORT*  Clinical Data: Vomiting.  Weakness.  ACUTE ABDOMEN SERIES (ABDOMEN 2 VIEW & CHEST 1 VIEW)  Comparison: Portable chest 05/02/2011.  Findings: There is stable mild cardiomegaly.  The lungs are clear. There is no pleural effusion or pneumothorax.  The bowel gas pattern is nonobstructive.  There is moderate stool throughout the colon.  No suspicious abdominal calcifications are demonstrated.  Small pelvic calcifications are likely phleboliths.  IMPRESSION: No acute cardiopulmonary or abdominal process identified.  Original Report Authenticated By: Gerrianne Scale, M.D.     1. Vomiting       MDM          Benny Lennert, MD 10/16/11 1003  Benny Lennert, MD 10/16/11 1003

## 2011-10-16 NOTE — Progress Notes (Addendum)
10/16/2011 patient came from the emergency room to 6700. She is alert, oriented and ambulatory. Patient stated she came to the emergency room on Monday, she had a abscess , which she had a I & D done. The area is clean, had some packing, which she stated is done daily at home. Patient also have a old abscess on the left lower abdomen, which she had two weeks ago, and the area is healed. The area was seem, by the wound care nurse today and dressing was done. Gloriajean Dell RN

## 2011-10-16 NOTE — H&P (Signed)
Hospital Admission Note Date: 10/16/2011  Patient name: [name] Medical record number: [id number] Date of birth: 1975/09/12 Age: 36 y.o. Gender: female PCP: Ignatius Specking., MD, MD  Medical Service: Mervyn Gay  Attending physician:   Dr. Blanch Media  1st Contact:    Dr. Yaakov Guthrie Pager: [phone] 2nd Contact:    Dr. Allena Katz Pager: [phone] After 5 pm or weekends: 1st Contact:      Pager: [phone] 2nd Contact:      Pager: [phone]  Chief Complaint:  Nausea and Vomiting  History of Present Illness: Ms. Kage is a 36 year old woman with Type 1 DM, ESRD on HD, mild CAD, HLD, HTN, history of multiple skin abscesses who has had a labial abscess for 5 days and nausea and vomiting for the last 3-4 days as well.  She first noticed the abscess on Friday 5 days ago.  Saturday she began to have associated chills and subjective fevers.  She came to the ED Monday.  There her temperature was as high as 101.44F.  She underwent I&D in the ED, 1 dose Vancomycin given.  She was sent home with 1 week prescription for Erythromycin.    She reports continued purulent drainage from the abscess since leaving ED two days ago.  Her subjective fevers and chills persist but seem to be slightly improving.  She has some mild associated pain that is also similar to two days ago.    She missed dialysis Monday because she chose to leave ED before renal consult returned page.    She has also had nausea and vomiting for the past 3 days.  This comes and goes, but she has vomited about 10 times so far today.  Vomit is either clear or green.    No diarrhea.  No constipation.  No CP, SOB, dysuria, hematuria.  Numbness and tingling in her feet is at baseline.  No headache.  No other complaints.   She reports about 3 similar abscesses in the past at various times.  These mostly were on her buttocks.    Meds: Medication List  As of 10/16/2011 12:16 PM   ASK your doctor about these medications         amLODipine  10 MG tablet   Commonly known as: NORVASC   Take 10 mg by mouth daily.      atorvastatin 80 MG tablet   Commonly known as: LIPITOR   Take 80 mg by mouth every evening.      calcium acetate 667 MG capsule   Commonly known as: PHOSLO   Take 2,001 mg by mouth 3 (three) times daily with meals.      carvedilol 25 MG tablet   Commonly known as: COREG   Take 25 mg by mouth 2 (two) times daily.      cloNIDine 0.1 MG tablet   Commonly known as: CATAPRES   Take 0.1 mg by mouth 2 (two) times daily.      clopidogrel 75 MG tablet   Commonly known as: PLAVIX   Take 75 mg by mouth daily.      erythromycin 250 MG tablet   Commonly known as: E-MYCIN   Take 1 tablet (250 mg total) by mouth every 6 (six) hours.      ibuprofen 200 MG tablet   Commonly known as: ADVIL,MOTRIN   Take 600 mg by mouth every 8 (eight) hours as needed. For pain.      insulin glargine 100 UNIT/ML injection   Commonly  known as: LANTUS   Inject 32 Units into the skin at bedtime.      insulin lispro 100 UNIT/ML injection   Commonly known as: HUMALOG   Inject 6-8 Units into the skin 3 (three) times daily before meals. Per sliding scale      isosorbide mononitrate 120 MG 24 hr tablet   Commonly known as: IMDUR   Take 120 mg by mouth daily.      labetalol 200 MG tablet   Commonly known as: NORMODYNE   Take 200 mg by mouth 2 (two) times daily.      nitroGLYCERIN 0.4 MG SL tablet   Commonly known as: NITROSTAT   Place 0.4 mg under the tongue every 5 (five) minutes as needed. For chest pain      torsemide 20 MG tablet   Commonly known as: DEMADEX   Take 20 mg by mouth 2 (two) times daily.            Allergies: Allergies as of 10/16/2011 - Review Complete 10/16/2011  Allergen Reaction Noted  . Cephalexin    . Sulfamethoxazole w-trimethoprim     Past Medical History  Diagnosis Date  . DM (diabetes mellitus)     Long-term insulin  . Hypertension   . Tobacco use disorder     Discontinued March 2012    . Hyperlipidemia     Hypertriglyceridemia 449 HDL 25  . Diabetic Charcot's joint disease   . Coronary artery disease     Status post cardiac catheterization June 2012 scattered coronary artery disease/atherosclerosis with 70-80% stenosis in a small right PDA.  . Gastroparesis   . Peripheral vascular disease     Tibial occlusive disease evaluated by Dr. Hart Rochester in August 2011. Medical therapy  . 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 CIN III (cervical intraepithelial neoplasia grade III) with severe dysplasia     S/P LEEP AND [facility]  . Angina   . Chronic anemia     2nd to renal disease  . Iron deficiency anemia   . End stage renal disease on dialysis     On dialysis since March 2012  . End stage renal disease on dialysis 05/02/11    [facility]; M; W, F; last time 05/01/11  . Migraines     "just on dialysis days"  . Peripheral neuropathy     related to DM  . 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 CHF (congestive heart failure)   . Shortness of breath 05/02/11    "resting"  . History of abscesses in groins 12/06/2010   Past Surgical History  Procedure Date  . Av fistula placement 04/2010  . Dilation and curettage of uterus 2009  . Cervical biopsy  w/ loop electrode excision     h/o  . Cervical cone biopsy     h/o   Family History  Problem Relation Age of Onset  . Diabetes Mother   . Diabetes Father   . Diabetes Sister   . Breast cancer Maternal Aunt   . Cancer Maternal Aunt     breast   History   Social History  . Marital Status: Married    Spouse Name: [name]    Number of Children: [name]  . Years of Education: [name]   Occupational History  .  [employer]   Social History Main Topics  . Smoking status: Former Smoker -- 0.3 packs/day for 10 years    Types: Cigarettes    Quit date: 05/27/2008  . Smokeless tobacco:  Never Used   Comment: "quit smoking cigarettes 07/2010"  . Alcohol Use: No  . Drug Use: No  . Sexually Active: Yes    Birth Control/ Protection: Condom   Other Topics  Concern  . Not on file   Social History Narrative  . No narrative on file    Review of Systems: See HPI  Physical Exam: Blood pressure 149/72, pulse 76, temperature 98.4 F (36.9 C), temperature source Oral, resp. rate 18, weight 230 lb (104.327 kg), last menstrual period 09/23/2011, SpO2 95.00%.  General: alert, well-developed, and cooperative to examination.  Head: normocephalic and atraumatic.   Eyes: vision grossly intact, pupils equal, pupils round, pupils reactive to light, no injection and anicteric.   Mouth: pharynx pink and moist, no erythema, and no exudates.   Neck: no JVD  Lungs: normal respiratory effort, no accessory muscle use, normal breath sounds, no crackles, and no wheezes.  Heart: normal rate, regular rhythm, no murmur, no gallop, and no rub.   Abdomen: soft, non-tender, normal bowel sounds, no distention, no guarding, no rebound tenderness  Perineal Exam: L labial slightly larger than right.  Tip of packing tape exposed from small incision.  Mild erythema, no drainage seen.  Msk: no joint swelling, no joint warmth, and no redness over joints.   Pulses: 2+ DP/PT pulses bilaterally  Extremities: No cyanosis, clubbing, edema  Neurologic: alert & oriented X3, cranial nerves II-XII intact, strength normal in all extremities, sensation intact to light touch  Skin: turgor normal and no rashes.     Lab results: Basic Metabolic Panel:  Basename 10/16/11 1021 10/16/11 0732 10/14/11 1147  NA -- 136 137  K -- 4.4 4.4  CL -- 98 99  CO2 -- 22 23  GLUCOSE -- 483* 270*  BUN -- 58* 69*  CREATININE -- 6.75* 7.83*  CALCIUM -- 9.2 9.2  MG 1.9 -- --  PHOS 5.9* -- --   CBC:  Basename 10/16/11 0732 10/14/11 1147  WBC 8.6 12.0*  NEUTROABS 6.5 --  HGB 8.0* 8.5*  HCT 23.9* 25.5*  MCV 92.3 92.1  PLT 173 176   CBG:  Basename 10/16/11 0845 10/16/11 0716 10/14/11 2022 10/14/11 1205  GLUCAP 427* 446* 396* 273*    Imaging results:  Dg Abd Acute  W/chest  10/16/2011  *RADIOLOGY REPORT*  Clinical Data: Vomiting.  Weakness.  ACUTE ABDOMEN SERIES (ABDOMEN 2 VIEW & CHEST 1 VIEW)  Comparison: Portable chest 05/02/2011.  Findings: There is stable mild cardiomegaly.  The lungs are clear. There is no pleural effusion or pneumothorax.  The bowel gas pattern is nonobstructive.  There is moderate stool throughout the colon.  No suspicious abdominal calcifications are demonstrated.  Small pelvic calcifications are likely phleboliths.  IMPRESSION: No acute cardiopulmonary or abdominal process identified.  Original Report Authenticated By: Gerrianne Scale, M.D.    Assessment & Plan by Problem:  Labial Abscess: Patient first noticed this 5 days ago.  Subjective fevers and chills started the next day.  Was I&D's two days ago in ED, one dose Vancomycin given, and PO erythromycin given.  Mild subjective fevers and chills have continued since that time, mild trend towards improvement.  Due to this persistence and associated nausea and vomiting, will admit to hospital for overnight observation.   Vancomycin Ibuprofen for pain relief since pain is not too bad Wound care nurse to follow patient here  Nausea and Vomiting: Etiology uncertain, possibly due to uremia from getting behind on dialysis (AG 16).  Less likely diagnoses  on the differential are infection and DKA.  Electrolytes look good for HD patient.  Lantus now Renal consult for HD Zofran NPO, sips with meds, ice chips No IVF because patient has ESRD on HD   IDDM: Patient reports history of type 1 DM.  Has many complications from this disease.  No recent HbA1c on record but her PCP is out of the area.  Suspect history of poor control given her many complications and current abscess.  Will try to get records from PCP, can fu with her PCP for DM control.  SSI Sensitive Lantus 32 units QHS Once eating more will increase mealtime insulin  HTN: BP here 156/71 is slightly elevated.  Likely due to  vomiting / not adequately keeping down her home medicines.  Treat her nausea and restart her home PO regimen of Amlodipine. Coreg, Clonidine, Imdur, and Labetalol.  ESRD: Renal consulted.  Today is her normal dialysis (MWF) day, but since she missed dialysis Monday she had some dialysis yesterday.  Defer to renal decision for dialysis today vs tomorrow.  Continue Phoslo.  CAD: Followed by cardiology as outpatient Dr. Shara Blazing.  Recently had low risk myoview study after ED presentation with chest pain.  No chest pain today.  Continue Plavix, Lipitor, Imdur.  NTG prn.  HLD: Followed by PCP in [place] so most recent lipid panel is not in our records.  Outpatient PCP and outpatient cardiology are on top of this, so will just continue current Lipitor dose throughout this hospitalization and on discharge.    Anemia: Hgb baseline 8s-10s. Hgb on low side of her baseline this week, 8 today 8.5 two days ago.  Was 10.3 on most recent prior check five months.  MCV 92.  Likely due to renal disease.  Will see if PCP records have any further workup.    DVT Prophylaxis: SubQ Heparin    Signed: Blanca Friend 10/16/2011, 11:51 AM

## 2011-10-16 NOTE — ED Provider Notes (Signed)
Medical screening examination/treatment/procedure(s) were performed by non-physician practitioner and as supervising physician I was immediately available for consultation/collaboration.   Gavin Pound. Dierra Riesgo, MD 10/16/11 4098

## 2011-10-16 NOTE — Consult Note (Signed)
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 Wound type:  Full Thickness   Measurement: 1.0 x 0.2 x 1.5 cm.  Wound bed: Difficult to assess due to size of opening.  Drainage (amount, consistency, odor) small amount of serosanguinous drainage noted without odor. Periwound: Erythema and Induration extends 1.5 cm beyond wound edges. Small skin tag near wound enterance. Dressing procedure/placement/frequency: Plan to pack with Iodoform guaze daily for antimicrobial treatment, cover with ABD pad to absorb.  Discussed dressing changes and treatment at home.  Husband changes dressings at home without difficulty.  Tolerated dressing change well. Requires premedication with Ibuprofen prior to dressing change.  Admits to this helping with pain.  Educated regarding bathing or showering with wound not packed.  Supplies left at bedside for staff nurse to use.   Will not plan to follow further unless re-consulted.  Thank-you,   Judie Grieve, BSN, WOC Nurse/ [place], Charity fundraiser, MSN, [employer]  [phone]

## 2011-10-16 NOTE — ED Notes (Signed)
CBG 446, notified RN

## 2011-10-16 NOTE — Progress Notes (Signed)
Have talked with Pharmacy X2 since patient came into hemo unit regarding Vancomycin dose for today.  The loading dose of 1500 mg that was infusing when patient came to hemo, was stopped and originally was to be given last 2 hours of treatment.  Later it was decided that instead of that order she is to have a random Vancomycin level 4 hours after hemodialysis end.

## 2011-10-17 ENCOUNTER — Ambulatory Visit: Payer: 59 | Admitting: Obstetrics and Gynecology

## 2011-10-17 DIAGNOSIS — L03319 Cellulitis of trunk, unspecified: Secondary | ICD-10-CM

## 2011-10-17 DIAGNOSIS — R112 Nausea with vomiting, unspecified: Secondary | ICD-10-CM

## 2011-10-17 DIAGNOSIS — L02219 Cutaneous abscess of trunk, unspecified: Secondary | ICD-10-CM

## 2011-10-17 DIAGNOSIS — N764 Abscess of vulva: Principal | ICD-10-CM

## 2011-10-17 LAB — CBC
HCT: 21.5 % — ABNORMAL LOW (ref 36.0–46.0)
Hemoglobin: 7.2 g/dL — ABNORMAL LOW (ref 12.0–15.0)
MCV: 92.7 fL (ref 78.0–100.0)
Platelets: 133 10*3/uL — ABNORMAL LOW (ref 150–400)
RBC: 2.32 MIL/uL — ABNORMAL LOW (ref 3.87–5.11)
WBC: 4.5 10*3/uL (ref 4.0–10.5)

## 2011-10-17 LAB — GLUCOSE, CAPILLARY
Glucose-Capillary: 129 mg/dL — ABNORMAL HIGH (ref 70–99)
Glucose-Capillary: 128 mg/dL — ABNORMAL HIGH (ref 70–99)

## 2011-10-17 LAB — DIFFERENTIAL
Eosinophils Relative: 5 % (ref 0–5)
Lymphocytes Relative: 34 % (ref 12–46)
Lymphs Abs: 1.5 10*3/uL (ref 0.7–4.0)

## 2011-10-17 LAB — COMPREHENSIVE METABOLIC PANEL
ALT: 7 U/L (ref 0–35)
Alkaline Phosphatase: 37 U/L — ABNORMAL LOW (ref 39–117)
CO2: 30 mEq/L (ref 19–32)
Calcium: 8.6 mg/dL (ref 8.4–10.5)
GFR calc Af Amer: 15 mL/min — ABNORMAL LOW (ref >90)
GFR calc non Af Amer: 13 mL/min — ABNORMAL LOW (ref >90)
Glucose, Bld: 103 mg/dL — ABNORMAL HIGH (ref 70–99)
Sodium: 140 mEq/L (ref 135–145)

## 2011-10-17 LAB — VANCOMYCIN, RANDOM: Vancomycin Rm: 9.9 ug/mL

## 2011-10-17 MED ORDER — DARBEPOETIN ALFA-POLYSORBATE 200 MCG/0.4ML IJ SOLN: 200.0000 ug | INTRAMUSCULAR | Status: DC

## 2011-10-17 MED ORDER — VANCOMYCIN HCL IN DEXTROSE 1-5 GM/200ML-% IV SOLN: 1000.0000 mg | Freq: Once | INTRAVENOUS | Status: DC

## 2011-10-17 MED ORDER — VANCOMYCIN HCL IN DEXTROSE 1-5 GM/200ML-% IV SOLN
1000.0000 mg | INTRAVENOUS | Status: DC
  Administered 2011-10-17: 1000 mg via INTRAVENOUS
  Filled 2011-10-17: qty 200

## 2011-10-17 NOTE — Discharge Summary (Signed)
Internal Medicine Teaching [facility] Discharge Note  Name: [name] MRN: [id number] DOB: Apr 02, 1976 36 y.o.  Date of Admission: 10/16/2011  7:02 AM Date of Discharge: 10/17/2011 Attending Physician: Burns Spain, MD  Discharge Diagnosis:  Skin Abscess L Labia Nausea & vomiting  IDDM Essential hypertension, benign End stage renal disease on dialysis Coronary artery disease Hyperlipidemia Chronic Anemia, likely related to ESRD  Discharge Medications: Medication List  As of 10/17/2011 10:20 AM   STOP taking these medications         erythromycin 250 MG tablet         TAKE these medications         amLODipine 10 MG tablet   Commonly known as: NORVASC   Take 10 mg by mouth daily.      atorvastatin 80 MG tablet   Commonly known as: LIPITOR   Take 80 mg by mouth every evening.      calcium acetate 667 MG capsule   Commonly known as: PHOSLO   Take 2,001 mg by mouth 3 (three) times daily with meals.      carvedilol 25 MG tablet   Commonly known as: COREG   Take 25 mg by mouth 2 (two) times daily.      cloNIDine 0.1 MG tablet   Commonly known as: CATAPRES   Take 0.1 mg by mouth 2 (two) times daily.      clopidogrel 75 MG tablet   Commonly known as: PLAVIX   Take 75 mg by mouth daily.      ibuprofen 200 MG tablet   Commonly known as: ADVIL,MOTRIN   Take 600 mg by mouth every 8 (eight) hours as needed. For pain.      insulin glargine 100 UNIT/ML injection   Commonly known as: LANTUS   Inject 32 Units into the skin at bedtime.      insulin lispro 100 UNIT/ML injection   Commonly known as: HUMALOG   Inject 6-8 Units into the skin 3 (three) times daily before meals. Per sliding scale      isosorbide mononitrate 120 MG 24 hr tablet   Commonly known as: IMDUR   Take 120 mg by mouth daily.      labetalol 200 MG tablet   Commonly known as: NORMODYNE   Take 200 mg by mouth 2 (two) times daily.      nitroGLYCERIN 0.4 MG SL tablet   Commonly  known as: NITROSTAT   Place 0.4 mg under the tongue every 5 (five) minutes as needed. For chest pain      torsemide 20 MG tablet   Commonly known as: DEMADEX   Take 20 mg by mouth 2 (two) times daily.      vancomycin 1 GM/200ML Soln   Commonly known as: VANCOCIN   Inject 200 mLs (1,000 mg total) into the vein once. Once tomorrow in last 1 hr of dialysis session   Start taking on: 10/18/2011            Disposition and follow-up:   [name] was discharged from [facility] in Stable condition.  She is to follow-up with her primary care doctor to continue diabetes management as well as to observe resolution of her L labial abscess.  She is to continue dialysis [facility] M[facility].    Follow-up Appointments:  Dr. Doreen Beam 10/30/2011 at 11:15am   Discharge Orders    Future Appointments: Provider: Department: Dept Phone: Center:   11/07/2011  1:00 PM Prescott Parma, [place] [facility] [phone] [email or web address]   12/12/2011 1:00 PM June Leap, MD [facility] [phone] [email or web address]      Consultations: Treatment Team:  Maree Krabbe, MD Md Ccs, MD  Procedures Performed:  Dg Abd Acute W/chest  10/16/2011  *RADIOLOGY REPORT*  Clinical Data: Vomiting.  Weakness.  ACUTE ABDOMEN SERIES (ABDOMEN 2 VIEW & CHEST 1 VIEW)  Comparison: Portable chest 05/02/2011.  Findings: There is stable mild cardiomegaly.  The lungs are clear. There is no pleural effusion or pneumothorax.  The bowel gas pattern is nonobstructive.  There is moderate stool throughout the colon.  No suspicious abdominal calcifications are demonstrated.  Small pelvic calcifications are likely phleboliths.  IMPRESSION: No acute cardiopulmonary or abdominal process identified.  Original Report Authenticated By: Gerrianne Scale, M.D.   Admission HPI:  [name] is a 36 year old woman with Type 1 DM, ESRD on HD, mild CAD, HLD, HTN, history of multiple skin abscesses who has had a  labial abscess for 5 days and nausea and vomiting for the last 3-4 days as well. She first noticed the abscess on Friday 5 days ago. Saturday she began to have associated chills and subjective fevers. She came to the ED Monday. There her temperature was as high as 101.49F. She underwent I&D in the ED, 1 dose Vancomycin given. She was sent home with 1 week prescription for Erythromycin.  She reports continued purulent drainage from the abscess since leaving ED two days ago. Her subjective fevers and chills persist but seem to be slightly improving. She has some mild associated pain that is also similar to two days ago.  She missed dialysis Monday because she chose to leave ED before renal consult returned page.  She has also had nausea and vomiting for the past 3 days. This comes and goes, but she has vomited about 10 times so far today. Vomit is either clear or green.  No diarrhea. No constipation. No CP, SOB, dysuria, hematuria. Numbness and tingling in her feet is at baseline. No headache. No other complaints.  She reports about 3 similar abscesses in the past at various times. These mostly were on her buttocks.    Hospital Course by problem list:  Skin Abscess L Labia: [name] first noticed this 5 days before admission. Subjective fevers and chills started the next day. Was I&D's two days before admission in ED, one dose Vancomycin given at that time, and PO erythromycin script given which she took for two days. [name] had fever to 101.49F in ED on 10/14/11 but no fevers this current admission.  Chills have continued until this admission, trending towards improvement.  [name] received Vancomycin during this admission.  Plan is for one more dose of Vancomycin at end of outpatient dialysis tomorrow, dialysis center has been notified.  This should cover her through 10/20/11 for 7 days total antibiotic therapy.  She can continue ibuprofen for pain as outpatient.  Abscess was evaluated by on call surgeon  during this admission, who felt that it was healing appropriately.  Wound care nurse recommendations are for daily lodoform gauze dressing changes at home by husband.  [name] to shower with wound not packed then repack.  PCP should follow up progression of resolution of this abscess.  Nausea & vomiting: 3-4 days of nausea and vomiting prior to admission.  Etiology uncertain.  Possibly related to her infection vs exacerbation of her gastroparesis.  Nausea improved some with antiemetics and vomiting resolved.  Electrolytes  looked good for HD [name].  IDDM: Sugars high 300s-500s per [name] report prior to admission.  Glucose in 400s in ED on arrival.  Home dose of Lantus 32 units continued here and a sensitive sliding scale was instituted in place of her home meal time insulin.  Glucoses overnight were in 100s and looked good.  [name] discharged on her prior home regimen.  Diabetes should be followed by her PCP.  Good control will help with abscess healing.    End stage renal disease on dialysis: Received dialysis here night of 10/16/11.  Plan is to continue MWF dialysis as outpatient.  Please give 1g Vancomycin during last hour of dialysis tomorrow 10/18/2011.  Discharge Vitals:  BP 130/68  Pulse 71  Temp(Src) 97.2 F (36.2 C) (Oral)  Resp 18  Ht 6\' 4"  (1.93 m)  Wt 234 lb 4.8 oz (106.278 kg)  BMI 28.52 kg/m2  SpO2 93%  LMP 09/23/2011  Discharge Labs:  Results for orders placed during the hospital encounter of 10/16/11 (from the past 24 hour(s))  MAGNESIUM     Status: Normal   Collection Time   10/16/11 10:21 AM      Component Value Range   Magnesium 1.9  1.5 - 2.5 (mg/dL)  PHOSPHORUS     Status: Abnormal   Collection Time   10/16/11 10:21 AM      Component Value Range   Phosphorus 5.9 (*) 2.3 - 4.6 (mg/dL)  TSH     Status: Normal   Collection Time   10/16/11 10:21 AM      Component Value Range   TSH 0.585  0.350 - 4.500 (uIU/mL)  CULTURE, BLOOD (ROUTINE X 2)     Status: Normal  (Preliminary result)   Collection Time   10/16/11 10:30 AM      Component Value Range   Specimen Description BLOOD ARM RIGHT     Special Requests BOTTLES DRAWN AEROBIC AND ANAEROBIC 10CC     Culture  Setup Time [id number]     Culture       Value:        BLOOD CULTURE RECEIVED NO GROWTH TO DATE CULTURE WILL BE HELD FOR 5 DAYS BEFORE ISSUING A FINAL NEGATIVE REPORT   Report Status PENDING    CULTURE, BLOOD (ROUTINE X 2)     Status: Normal (Preliminary result)   Collection Time   10/16/11 10:37 AM      Component Value Range   Specimen Description BLOOD ARM RIGHT     Special Requests BOTTLES DRAWN AEROBIC AND ANAEROBIC 10CC     Culture  Setup Time [id number]     Culture       Value:        BLOOD CULTURE RECEIVED NO GROWTH TO DATE CULTURE WILL BE HELD FOR 5 DAYS BEFORE ISSUING A FINAL NEGATIVE REPORT   Report Status PENDING    GLUCOSE, CAPILLARY     Status: Abnormal   Collection Time   10/16/11 11:12 AM      Component Value Range   Glucose-Capillary 400 (*) 70 - 99 (mg/dL)   Comment 1 Documented in Chart     Comment 2 Notify RN    GLUCOSE, CAPILLARY     Status: Abnormal   Collection Time   10/16/11 12:53 PM      Component Value Range   Glucose-Capillary 350 (*) 70 - 99 (mg/dL)  POTASSIUM     Status: Normal   Collection Time   10/16/11  3:55 PM  Component Value Range   Potassium 3.9  3.5 - 5.1 (mEq/L)  GLUCOSE, CAPILLARY     Status: Abnormal   Collection Time   10/16/11  8:56 PM      Component Value Range   Glucose-Capillary 156 (*) 70 - 99 (mg/dL)   Comment 1 Documented in Chart     Comment 2 Notify RN    VANCOMYCIN, RANDOM     Status: Normal   Collection Time   10/16/11 11:52 PM      Component Value Range   Vancomycin Rm 9.9    GLUCOSE, CAPILLARY     Status: Abnormal   Collection Time   10/17/11 12:02 AM      Component Value Range   Glucose-Capillary 141 (*) 70 - 99 (mg/dL)  GLUCOSE, CAPILLARY     Status: Abnormal   Collection Time   10/17/11  4:09 AM       Component Value Range   Glucose-Capillary 129 (*) 70 - 99 (mg/dL)  CBC     Status: Abnormal   Collection Time   10/17/11  6:05 AM      Component Value Range   WBC 4.5  4.0 - 10.5 (K/uL)   RBC 2.32 (*) 3.87 - 5.11 (MIL/uL)   Hemoglobin 7.2 (*) 12.0 - 15.0 (g/dL)   HCT 04.5 (*) 40.9 - 46.0 (%)   MCV 92.7  78.0 - 100.0 (fL)   MCH 31.0  26.0 - 34.0 (pg)   MCHC 33.5  30.0 - 36.0 (g/dL)   RDW 81.1  91.4 - 78.2 (%)   Platelets 133 (*) 150 - 400 (K/uL)  DIFFERENTIAL     Status: Abnormal   Collection Time   10/17/11  6:05 AM      Component Value Range   Neutrophils Relative 48  43 - 77 (%)   Neutro Abs 2.1  1.7 - 7.7 (K/uL)   Lymphocytes Relative 34  12 - 46 (%)   Lymphs Abs 1.5  0.7 - 4.0 (K/uL)   Monocytes Relative 13 (*) 3 - 12 (%)   Monocytes Absolute 0.6  0.1 - 1.0 (K/uL)   Eosinophils Relative 5  0 - 5 (%)   Eosinophils Absolute 0.2  0.0 - 0.7 (K/uL)   Basophils Relative 0  0 - 1 (%)   Basophils Absolute 0.0  0.0 - 0.1 (K/uL)  COMPREHENSIVE METABOLIC PANEL     Status: Abnormal   Collection Time   10/17/11  6:05 AM      Component Value Range   Sodium 140  135 - 145 (mEq/L)   Potassium 3.7  3.5 - 5.1 (mEq/L)   Chloride 99  96 - 112 (mEq/L)   CO2 30  19 - 32 (mEq/L)   Glucose, Bld 103 (*) 70 - 99 (mg/dL)   BUN 24 (*) 6 - 23 (mg/dL)   Creatinine, Ser 9.56 (*) 0.50 - 1.10 (mg/dL)   Calcium 8.6  8.4 - 21.3 (mg/dL)   Total Protein 6.0  6.0 - 8.3 (g/dL)   Albumin 2.7 (*) 3.5 - 5.2 (g/dL)   AST 6  0 - 37 (U/L)   ALT 7  0 - 35 (U/L)   Alkaline Phosphatase 37 (*) 39 - 117 (U/L)   Total Bilirubin 0.2 (*) 0.3 - 1.2 (mg/dL)   GFR calc non Af Amer 13 (*) >90 (mL/min)   GFR calc Af Amer 15 (*) >90 (mL/min)  GLUCOSE, CAPILLARY     Status: Abnormal   Collection Time   10/17/11  7:41 AM      Component Value Range   Glucose-Capillary 128 (*) 70 - 99 (mg/dL)   Comment 1 Documented in Chart     Comment 2 Notify RN      SignedYaakov Guthrie, BRAD 10/17/2011, 10:20 AM   Time Spent on  Discharge: 35 minutes

## 2011-10-17 NOTE — Progress Notes (Signed)
Subjective:  Patient doing well.  Afebrile since admission.  Continues to have chills.  Minimal pain.    Objective: Vital signs in last 24 hours: Filed Vitals:   10/16/11 1900 10/16/11 2000 10/16/11 2059 10/17/11 0459  BP: 155/69 145/83 155/83 116/65  Pulse: 72 73 68 62  Temp:  97.1 F (36.2 C) 98 F (36.7 C) 97.2 F (36.2 C)  TempSrc:  Oral Oral Oral  Resp: 16 20 19 20   Height:   6\' 4"  (1.93 m)   Weight:  234 lb 9.1 oz (106.4 kg) 234 lb 4.8 oz (106.278 kg)   SpO2:  97% 97% 96%   Weight change:   Intake/Output Summary (Last 24 hours) at 10/17/11 0907 Last data filed at 10/16/11 2102  Gross per 24 hour  Intake    740 ml  Output   1618 ml  Net   -878 ml    General: alert, well-developed, and cooperative to examination.   Head: normocephalic and atraumatic.   Eyes: vision grossly intact, pupils equal, pupils round, pupils reactive to light, no injection and anicteric.   Mouth: pharynx pink and moist, no erythema, and no exudates.   Neck: no JVD   Lungs: normal respiratory effort, no accessory muscle use, normal breath sounds, no crackles, and no wheezes.   Heart: normal rate, regular rhythm, no murmur, no gallop, and no rub.   Msk: no joint swelling, no joint warmth, and no redness over joints.   Pulses: 2+ DP/PT pulses bilaterally   Neurologic: alert & oriented X3, cranial nerves II-XII intact, strength normal in all extremities  Skin: turgor normal and no rashes.     Lab Results: Basic Metabolic Panel:  Lab 10/17/11 5621 10/16/11 1555 10/16/11 1021 10/16/11 0732  NA 140 -- -- 136  K 3.7 3.9 -- --  CL 99 -- -- 98  CO2 30 -- -- 22  GLUCOSE 103* -- -- 483*  BUN 24* -- -- 58*  CREATININE 4.04* -- -- 6.75*  CALCIUM 8.6 -- -- 9.2  MG -- -- 1.9 --  PHOS -- -- 5.9* --   Liver Function Tests:  Lab 10/17/11 0605  AST 6  ALT 7  ALKPHOS 37*  BILITOT 0.2*  PROT 6.0  ALBUMIN 2.7*   CBC:  Lab 10/17/11 0605 10/16/11 0732  WBC 4.5 8.6  NEUTROABS 2.1  6.5  HGB 7.2* 8.0*  HCT 21.5* 23.9*  MCV 92.7 92.3  PLT 133* 173   CBG:  Lab 10/17/11 0741 10/17/11 0409 10/17/11 0002 10/16/11 2056 10/16/11 1253 10/16/11 1112  GLUCAP 128* 129* 141* 156* 350* 400*   Thyroid Function Tests:  Lab 10/16/11 1021  TSH 0.585  T4TOTAL --  FREET4 --  T3FREE --  THYROIDAB --    Micro Results: Recent Results (from the past 240 hour(s))  CULTURE, BLOOD (ROUTINE X 2)     Status: Normal (Preliminary result)   Collection Time   10/16/11 10:30 AM      Component Value Range Status Comment   Specimen Description BLOOD ARM RIGHT   Final    Special Requests BOTTLES DRAWN AEROBIC AND ANAEROBIC 10CC   Final    Culture  Setup Time [id number]   Final    Culture     Final    Value:        BLOOD CULTURE RECEIVED NO GROWTH TO DATE CULTURE WILL BE HELD FOR 5 DAYS BEFORE ISSUING A FINAL NEGATIVE REPORT   Report Status PENDING   Incomplete   CULTURE,  BLOOD (ROUTINE X 2)     Status: Normal (Preliminary result)   Collection Time   10/16/11 10:37 AM      Component Value Range Status Comment   Specimen Description BLOOD ARM RIGHT   Final    Special Requests BOTTLES DRAWN AEROBIC AND ANAEROBIC 10CC   Final    Culture  Setup Time [id number]   Final    Culture     Final    Value:        BLOOD CULTURE RECEIVED NO GROWTH TO DATE CULTURE WILL BE HELD FOR 5 DAYS BEFORE ISSUING A FINAL NEGATIVE REPORT   Report Status PENDING   Incomplete    Studies/Results: Dg Abd Acute W/chest  10/16/2011  *RADIOLOGY REPORT*  Clinical Data: Vomiting.  Weakness.  ACUTE ABDOMEN SERIES (ABDOMEN 2 VIEW & CHEST 1 VIEW)  Comparison: Portable chest 05/02/2011.  Findings: There is stable mild cardiomegaly.  The lungs are clear. There is no pleural effusion or pneumothorax.  The bowel gas pattern is nonobstructive.  There is moderate stool throughout the colon.  No suspicious abdominal calcifications are demonstrated.  Small pelvic calcifications are likely phleboliths.  IMPRESSION: No acute  cardiopulmonary or abdominal process identified.  Original Report Authenticated By: Gerrianne Scale, M.D.   Medications: I have reviewed the patient's current medications. Scheduled Meds:   . amLODipine  10 mg Oral Daily  . atorvastatin  80 mg Oral QPM  . calcium acetate  2,001 mg Oral TID WC  . carvedilol  25 mg Oral BID  . cloNIDine  0.1 mg Oral BID  . clopidogrel  75 mg Oral Daily  . darbepoetin (ARANESP) injection - DIALYSIS  100 mcg Intravenous Q Wed-HD  . ferric gluconate (FERRLECIT/NULECIT) IV  125 mg Intravenous Q Mon-HD  . heparin  5,000 Units Subcutaneous Q8H  . insulin aspart  0-9 Units Subcutaneous Q4H  . insulin glargine  32 Units Subcutaneous QHS  . isosorbide mononitrate  120 mg Oral Daily  . labetalol  200 mg Oral BID  . paricalcitol  9 mcg Intravenous 3 times weekly  . senna-docusate  2 tablet Oral BID  . sodium chloride  3 mL Intravenous Q12H  . vancomycin  1,500 mg Intravenous Once  . vancomycin  1,000 mg Intravenous Q M,W,F-HD  . DISCONTD: darbepoetin (ARANESP) injection - DIALYSIS  100 mcg Intravenous Q Wed-HD  . DISCONTD: erythromycin  250 mg Oral Q6H  . DISCONTD: insulin glargine  10 Units Subcutaneous QHS  . DISCONTD: isosorbide mononitrate  120 mg Oral Daily  . DISCONTD: labetalol  200 mg Oral BID  . DISCONTD: vancomycin  1,500 mg Intravenous Once   Continuous Infusions:  PRN Meds:.sodium chloride, ibuprofen, nitroGLYCERIN, ondansetron (ZOFRAN) IV, ondansetron, sodium chloride, DISCONTD: ondansetron (ZOFRAN) IV   Assessment/Plan:  LOS: 1 day   Labial Abscess: Patient first noticed this 6 days ago. Subjective fevers and chills started the next day. Was I&D's 3 days ago in ED, one dose Vancomycin given, and PO erythromycin given. Mild subjective fevers and chills have continued since that time, trend towards improvement.  No fevers here but some mild chills persist.  Vomiting resolved, nausea improved.  Has not needed Zofran overnight.   Received  Vancomycin after dialysis in early morning today.  I spoke with pharmacy.  1g Vancomycin at end of dialysis tomorrow will cover her through Sunday which will be a long enough antibiotic course.  I spoke with her [facility] and they will give her 1g Vancomycin with her  outpatient dialysis tomorrow.     Ibuprofen for pain relief since pain is not too bad   Wound care nurse to follow patient here, husband can continue packing at home and PCP can follow-up on this  Ready for discharge today   Nausea and Vomiting: Etiology uncertain, possibly due to uremia from getting behind on dialysis (AG 16 - > 13). Less likely diagnoses on the differential are infection and DKA. Electrolytes look good for HD patient.   Will discharge on prior home insulin regimen, DM can be followed as outpatient by her PCP  Zofran  Diabetic diet   IDDM: Patient reports history of type 1 DM. Has many complications from this disease. HbA1c 8.6% in March, suspect history of poor control given her many complications and current abscess. CBGs look good overnight on home lantus plus SSI sensitive.  SSI Sensitive plus Lantus here Discharge on prior home regimen   HTN: BPs a little up and down but look ok. Continue her home PO regimen of Amlodipine. Coreg, Clonidine, Imdur, and Labetalol here and on discharge.   ESRD: Renal consulted. Today is her normal dialysis (MWF) day, but since she missed dialysis Monday she had some dialysis Tuesday. Has dialysis yesterday here. Continue Phoslo.    CAD: Followed by cardiology as outpatient Dr. Shara Blazing. Recently had low risk myoview study after ED presentation with chest pain. No chest pain today. Continue Plavix, Lipitor, Imdur. NTG prn.    HLD: Followed by PCP in [place].  Called for records, they could not find a lipid panel on record.  Outpatient PCP and outpatient cardiology are on top of this, so will just continue current Lipitor dose throughout this hospitalization and  on discharge.  Recommend lipid panel be checked as outpatient if it has not been checked in past year.   DVT Prophylaxis: SubQ Heparin     [name], BRAD 10/17/2011, 9:07 AM

## 2011-10-17 NOTE — Progress Notes (Signed)
ANTIBIOTIC CONSULT NOTE   Pharmacy Consult for Vancomycin Indication: cellulitis - left labial area  Allergies  Allergen Reactions  . Cephalexin     REACTION: ?dz at young age  . Sulfamethoxazole W-Trimethoprim     REACTION: thrush    Patient Measurements: Height: 6\' 4"  (193 cm) Weight: 234 lb 4.8 oz (106.278 kg) IBW/kg (Calculated) : 82.3   Vital Signs: Temp: 98 F (36.7 C) (05/22 2059) Temp src: Oral (05/22 2059) BP: 155/83 mmHg (05/22 2059) Pulse Rate: 68  (05/22 2059)  Labs:  Basename 10/16/11 0732 10/14/11 1147  WBC 8.6 12.0*  HGB 8.0* 8.5*  PLT 173 176  LABCREA -- --  CREATININE 6.75* 7.83*   ESRD - usual MWF dialysis  Microbiology:   Blood cultures drawn in ED this morning.   S/p I & D of labial abscess in ED on 5/20.   Purulent drainage noted, but no cultures found.  Assessment: 36 yo female with Labial abscess for Vancomycin.    Goal of Therapy:  Pre-dialysis Vancomycin levels 15-25 mcg/ml (or post-HD level 10-15).  Plan:   Vancomycin 1 g IV after each HD  Rodgers Likes Vernon,10/17/2011,12:53 AM

## 2011-10-17 NOTE — H&P (Signed)
Internal Medicine Teaching Service Attending Note Date: 10/17/2011  Patient name: [name]  Medical record number: [id number]  Date of birth: 1975-12-15   I have seen and evaluated [name] and discussed their care with the Residency Team. Please see Dr Thane Edu H&P for full details. I agree with the formulated Assessment and Plan with the following changes:   1. Labile abscess -   [name] has h/o of abscess on her buttocks. She noticed an abscess on her L labia on the 17th. She presented to the ER on the 20th a 2x3 cm abscess that was I&D. Pt was D/C'd home with erythromycin. She then called her surgeon / or nephrologist who reportedly requested that she get IV ABX. She returned to the ER and got one dose of IV Vanc and was sent home.   Pt has taken the erythromycin and despite vomiting was able to keep down all doses. Therefore, today is day 5 of ABX total. WBC originally at 12 < 8.6 < 4.5 today. Surgery & wound care have examined wound and feel nothing else needs to be done except cont packing the wound which the husband has been doing and can cont to do.   She will get one and final dose of vanc after HD tomorrow. That will total 8 days and since there was no cellulitis described at first ER visit, will be adequate. She states her PCP will be able to follow resolution of abscess.  2. N/V -   This started the day after she noticed the abscess but before the ABX. She had no electrolyte abnl and her exam was and remains benign. She has tolerated clears quite well and is anxious to eat a regular lunch. If OK with lunch, D/C home. Unknown cause. Could be 2/2 known gastroparesis, infection or a coincidental AGE.   3. ESRD -   Pt missed her HD on the 20th as she wasn't feeling well. Had short session on the 21st. To resume her nl HD session.  4. Anemia -   Lower than baseline. Renal would have completed iron studies and adequately repleting iron stories if on EPO. Will defer  to PCP and renal.  Stable for D/C. One dose vanc HD tomorrow. Wound packing. F/U PCP.

## 2011-10-17 NOTE — Progress Notes (Signed)
Patient ID: [name], female   DOB: 03/15/1976, 36 y.o.   MRN: [id number] Chief Complaint:   Left thigh groin abscess drained and packed in ER  History of Present Illness:  [name] is an 36 y.o. female was seen to examine area drained in ED.  Was seen this morning and packing removed.  Area is not red.    Past Medical History  Diagnosis Date  . DM (diabetes mellitus)     Long-term insulin  . Hypertension   . Tobacco use disorder     Discontinued March 2012  . Hyperlipidemia     Hypertriglyceridemia 449 HDL 25  . Diabetic Charcot's joint disease   . Coronary artery disease     Status post cardiac catheterization June 2012 scattered coronary artery disease/atherosclerosis with 70-80% stenosis in a small right PDA.  . Gastroparesis   . Peripheral vascular disease     Tibial occlusive disease evaluated by Dr. Hart Rochester in August 2011. Medical therapy  . 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 CIN III (cervical intraepithelial neoplasia grade III) with severe dysplasia     S/P LEEP AND CONE  . Angina   . Chronic anemia     2nd to renal disease  . Iron deficiency anemia   . End stage renal disease on dialysis     On dialysis since March 2012  . End stage renal disease on dialysis 05/02/11    [facility]; M; W, F; last time 05/01/11  . Migraines     "just on dialysis days"  . Peripheral neuropathy     related to DM  . 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 CHF (congestive heart failure)   . Shortness of breath 05/02/11    "resting"  . History of abscesses in groins 12/06/2010    Past Surgical History  Procedure Date  . Av fistula placement 04/2010  . Dilation and curettage of uterus 2009  . Cervical biopsy  w/ loop electrode excision     h/o  . Cervical cone biopsy     h/o    Current Facility-Administered Medications  Medication Dose Route Frequency Provider Last Rate Last Dose  . 0.9 %  sodium chloride infusion  250 mL Intravenous PRN Lars Mage, MD      . amLODipine  (NORVASC) tablet 10 mg  10 mg Oral Daily Donald Pore, PA      . atorvastatin (LIPITOR) tablet 80 mg  80 mg Oral QPM Lars Mage, MD   80 mg at 10/16/11 2102  . calcium acetate (PHOSLO) capsule 2,001 mg  2,001 mg Oral TID WC Lars Mage, MD      . carvedilol (COREG) tablet 25 mg  25 mg Oral BID Donald Pore, PA   25 mg at 10/16/11 2211  . cloNIDine (CATAPRES) tablet 0.1 mg  0.1 mg Oral BID Lars Mage, MD   0.1 mg at 10/16/11 2211  . clopidogrel (PLAVIX) tablet 75 mg  75 mg Oral Daily Lars Mage, MD   75 mg at 10/16/11 2102  . darbepoetin (ARANESP) injection 100 mcg  100 mcg Intravenous Q Wed-HD Gwenlyn Found Carney, PHARMD   100 mcg at 10/16/11 1804  . ferric gluconate (NULECIT) 125 mg in sodium chloride 0.9 % 100 mL IVPB  125 mg Intravenous Q Mon-HD Donald Pore, PA      . heparin injection 5,000 Units  5,000 Units Subcutaneous Q8H Lars Mage, MD   5,000 Units at 10/17/11 0600  .  ibuprofen (ADVIL,MOTRIN) tablet 600 mg  600 mg Oral Q8H PRN Lars Mage, MD   600 mg at 10/16/11 2211  . insulin aspart (novoLOG) injection 0-9 Units  0-9 Units Subcutaneous Q4H Lars Mage, MD   2 Units at 10/16/11 2102  . insulin glargine (LANTUS) injection 32 Units  32 Units Subcutaneous QHS Lars Mage, MD   32 Units at 10/16/11 2212  . isosorbide mononitrate (IMDUR) 24 hr tablet 120 mg  120 mg Oral Daily Burns Spain, MD   120 mg at 10/16/11 2101  . labetalol (NORMODYNE) tablet 200 mg  200 mg Oral BID Donald Pore, PA   200 mg at 10/16/11 2211  . nitroGLYCERIN (NITROSTAT) SL tablet 0.4 mg  0.4 mg Sublingual Q5 min PRN Lars Mage, MD      . ondansetron (ZOFRAN) injection 4 mg  4 mg Intravenous Once Benny Lennert, MD   4 mg at 10/16/11 0859  . ondansetron (ZOFRAN) tablet 4 mg  4 mg Oral Q6H PRN Lars Mage, MD       Or  . ondansetron (ZOFRAN) injection 4 mg  4 mg Intravenous Q6H PRN Lars Mage, MD      . paricalcitol (ZEMPLAR) injection 9 mcg  9 mcg Intravenous 3 times weekly Donald Pore, PA   9 mcg at  10/16/11 1807  . senna-docusate (Senokot-S) tablet 2 tablet  2 tablet Oral BID Lars Mage, MD      . sodium chloride 0.9 % injection 3 mL  3 mL Intravenous Q12H Lars Mage, MD   3 mL at 10/16/11 2214  . sodium chloride 0.9 % injection 3 mL  3 mL Intravenous PRN Lars Mage, MD      . vancomycin (VANCOCIN) 1,500 mg in sodium chloride 0.9 % 500 mL IVPB  1,500 mg Intravenous Once Lars Mage, MD   1,500 mg at 10/16/11 1422  . vancomycin (VANCOCIN) IVPB 1000 mg/200 mL premix  1,000 mg Intravenous Q M,W,F-HD Burns Spain, MD   1,000 mg at 10/17/11 0405  . DISCONTD: darbepoetin (ARANESP) injection 100 mcg  100 mcg Intravenous Q Wed-HD Donald Pore, PA      . DISCONTD: erythromycin (E-MYCIN) tablet 250 mg  250 mg Oral Q6H Lars Mage, MD      . DISCONTD: insulin glargine (LANTUS) injection 10 Units  10 Units Subcutaneous QHS Lars Mage, MD   10 Units at 10/16/11 1025  . DISCONTD: isosorbide mononitrate (IMDUR) 24 hr tablet 120 mg  120 mg Oral Daily Lars Mage, MD      . DISCONTD: labetalol (NORMODYNE) tablet 200 mg  200 mg Oral BID Lars Mage, MD      . DISCONTD: ondansetron (ZOFRAN) injection 4 mg  4 mg Intravenous Q8H PRN Benny Lennert, MD      . DISCONTD: vancomycin (VANCOCIN) 1,500 mg in sodium chloride 0.9 % 500 mL IVPB  1,500 mg Intravenous Once Burns Spain, MD       Cephalexin and Sulfamethoxazole w-trimethoprim Family History  Problem Relation Age of Onset  . Diabetes Mother   . Diabetes Father   . Diabetes Sister   . Breast cancer Maternal Aunt   . Cancer Maternal Aunt     breast   Social History:   reports that she quit smoking about 2 months ago. Her smoking use included Cigarettes. She has a 3 pack-year smoking history. She has never used smokeless tobacco. She reports that she does not drink alcohol or use illicit drugs.  REVIEW OF SYSTEMS - PERTINENT POSITIVES ONLY: Non contributiory to extensive medical problems  Physical Exam:   Blood pressure 116/65, pulse  62, temperature 97.2 F (36.2 C), temperature source Oral, resp. rate 20, height 6\' 4"  (1.93 m), weight 234 lb 4.8 oz (106.278 kg), last menstrual period 09/23/2011, SpO2 96.00%. Body mass index is 28.52 kg/(m^2).  Gen:  WDWN WF NAD  GU: packing in left thigh labial area removed.  Quarter inch packing about 3 inches in length.  Area not red.  Appears adquately drained.  No further instrumentation necessary    LABORATORY RESULTS: Results for orders placed during the hospital encounter of 10/16/11 (from the past 48 hour(s))  GLUCOSE, CAPILLARY     Status: Abnormal   Collection Time   10/16/11  7:16 AM      Component Value Range Comment   Glucose-Capillary 446 (*) 70 - 99 (mg/dL)    Comment 1 Notify RN     CBC     Status: Abnormal   Collection Time   10/16/11  7:32 AM      Component Value Range Comment   WBC 8.6  4.0 - 10.5 (K/uL)    RBC 2.59 (*) 3.87 - 5.11 (MIL/uL)    Hemoglobin 8.0 (*) 12.0 - 15.0 (g/dL)    HCT 40.9 (*) 81.1 - 46.0 (%)    MCV 92.3  78.0 - 100.0 (fL)    MCH 30.9  26.0 - 34.0 (pg)    MCHC 33.5  30.0 - 36.0 (g/dL)    RDW 91.4  78.2 - 95.6 (%)    Platelets 173  150 - 400 (K/uL)   DIFFERENTIAL     Status: Abnormal   Collection Time   10/16/11  7:32 AM      Component Value Range Comment   Neutrophils Relative 75  43 - 77 (%)    Neutro Abs 6.5  1.7 - 7.7 (K/uL)    Lymphocytes Relative 10 (*) 12 - 46 (%)    Lymphs Abs 0.9  0.7 - 4.0 (K/uL)    Monocytes Relative 12  3 - 12 (%)    Monocytes Absolute 1.0  0.1 - 1.0 (K/uL)    Eosinophils Relative 3  0 - 5 (%)    Eosinophils Absolute 0.2  0.0 - 0.7 (K/uL)    Basophils Relative 0  0 - 1 (%)    Basophils Absolute 0.0  0.0 - 0.1 (K/uL)   BASIC METABOLIC PANEL     Status: Abnormal   Collection Time   10/16/11  7:32 AM      Component Value Range Comment   Sodium 136  135 - 145 (mEq/L)    Potassium 4.4  3.5 - 5.1 (mEq/L)    Chloride 98  96 - 112 (mEq/L)    CO2 22  19 - 32 (mEq/L)    Glucose, Bld 483 (*) 70 - 99 (mg/dL)      BUN 58 (*) 6 - 23 (mg/dL)    Creatinine, Ser 2.13 (*) 0.50 - 1.10 (mg/dL)    Calcium 9.2  8.4 - 10.5 (mg/dL)    GFR calc non Af Amer 7 (*) >90 (mL/min)    GFR calc Af Amer 8 (*) >90 (mL/min)   GLUCOSE, CAPILLARY     Status: Abnormal   Collection Time   10/16/11  8:45 AM      Component Value Range Comment   Glucose-Capillary 427 (*) 70 - 99 (mg/dL)    Comment 1 Notify RN  MAGNESIUM     Status: Normal   Collection Time   10/16/11 10:21 AM      Component Value Range Comment   Magnesium 1.9  1.5 - 2.5 (mg/dL)   PHOSPHORUS     Status: Abnormal   Collection Time   10/16/11 10:21 AM      Component Value Range Comment   Phosphorus 5.9 (*) 2.3 - 4.6 (mg/dL)   TSH     Status: Normal   Collection Time   10/16/11 10:21 AM      Component Value Range Comment   TSH 0.585  0.350 - 4.500 (uIU/mL)   GLUCOSE, CAPILLARY     Status: Abnormal   Collection Time   10/16/11 11:12 AM      Component Value Range Comment   Glucose-Capillary 400 (*) 70 - 99 (mg/dL)    Comment 1 Documented in Chart      Comment 2 Notify RN     GLUCOSE, CAPILLARY     Status: Abnormal   Collection Time   10/16/11 12:53 PM      Component Value Range Comment   Glucose-Capillary 350 (*) 70 - 99 (mg/dL)   POTASSIUM     Status: Normal   Collection Time   10/16/11  3:55 PM      Component Value Range Comment   Potassium 3.9  3.5 - 5.1 (mEq/L)   GLUCOSE, CAPILLARY     Status: Abnormal   Collection Time   10/16/11  8:56 PM      Component Value Range Comment   Glucose-Capillary 156 (*) 70 - 99 (mg/dL)    Comment 1 Documented in Chart      Comment 2 Notify RN     VANCOMYCIN, RANDOM     Status: Normal   Collection Time   10/16/11 11:52 PM      Component Value Range Comment   Vancomycin Rm 9.9     CBC     Status: Abnormal   Collection Time   10/17/11  6:05 AM      Component Value Range Comment   WBC 4.5  4.0 - 10.5 (K/uL)    RBC 2.32 (*) 3.87 - 5.11 (MIL/uL)    Hemoglobin 7.2 (*) 12.0 - 15.0 (g/dL)    HCT 40.9 (*) 81.1 -  46.0 (%)    MCV 92.7  78.0 - 100.0 (fL)    MCH 31.0  26.0 - 34.0 (pg)    MCHC 33.5  30.0 - 36.0 (g/dL)    RDW 91.4  78.2 - 95.6 (%)    Platelets 133 (*) 150 - 400 (K/uL)   DIFFERENTIAL     Status: Abnormal   Collection Time   10/17/11  6:05 AM      Component Value Range Comment   Neutrophils Relative 48  43 - 77 (%)    Neutro Abs 2.1  1.7 - 7.7 (K/uL)    Lymphocytes Relative 34  12 - 46 (%)    Lymphs Abs 1.5  0.7 - 4.0 (K/uL)    Monocytes Relative 13 (*) 3 - 12 (%)    Monocytes Absolute 0.6  0.1 - 1.0 (K/uL)    Eosinophils Relative 5  0 - 5 (%)    Eosinophils Absolute 0.2  0.0 - 0.7 (K/uL)    Basophils Relative 0  0 - 1 (%)    Basophils Absolute 0.0  0.0 - 0.1 (K/uL)   COMPREHENSIVE METABOLIC PANEL     Status: Abnormal   Collection Time  10/17/11  6:05 AM      Component Value Range Comment   Sodium 140  135 - 145 (mEq/L)    Potassium 3.7  3.5 - 5.1 (mEq/L)    Chloride 99  96 - 112 (mEq/L)    CO2 30  19 - 32 (mEq/L)    Glucose, Bld 103 (*) 70 - 99 (mg/dL)    BUN 24 (*) 6 - 23 (mg/dL) DELTA CHECK NOTED   Creatinine, Ser 4.04 (*) 0.50 - 1.10 (mg/dL) DELTA CHECK NOTED   Calcium 8.6  8.4 - 10.5 (mg/dL)    Total Protein 6.0  6.0 - 8.3 (g/dL)    Albumin 2.7 (*) 3.5 - 5.2 (g/dL)    AST 6  0 - 37 (U/L)    ALT 7  0 - 35 (U/L)    Alkaline Phosphatase 37 (*) 39 - 117 (U/L)    Total Bilirubin 0.2 (*) 0.3 - 1.2 (mg/dL)    GFR calc non Af Amer 13 (*) >90 (mL/min)    GFR calc Af Amer 15 (*) >90 (mL/min)     RADIOLOGY RESULTS: Dg Abd Acute W/chest  10/16/2011  *RADIOLOGY REPORT*  Clinical Data: Vomiting.  Weakness.  ACUTE ABDOMEN SERIES (ABDOMEN 2 VIEW & CHEST 1 VIEW)  Comparison: Portable chest 05/02/2011.  Findings: There is stable mild cardiomegaly.  The lungs are clear. There is no pleural effusion or pneumothorax.  The bowel gas pattern is nonobstructive.  There is moderate stool throughout the colon.  No suspicious abdominal calcifications are demonstrated.  Small pelvic  calcifications are likely phleboliths.  IMPRESSION: No acute cardiopulmonary or abdominal process identified.  Original Report Authenticated By: Gerrianne Scale, M.D.    Problem List: Patient Active Problem List  Diagnoses  . IDDM  . RBC HYPOCHROMIA  . UNSPECIFIED ANEMIA  . NONDEPENDENT TOBACCO USE DISORDER  . Essential hypertension, benign  . NEPHROTIC SYNDROME  . SHORTNESS OF BREATH  . CHEST PAIN UNSPECIFIED  . PRECORDIAL PAIN  . End stage renal disease on dialysis  . Coronary artery disease  . Hyperlipidemia  . History of abscesses in groins  . CIN III (cervical intraepithelial neoplasia grade III) with severe dysplasia  . History of noncompliance with medical treatment  . Chronic UTI  . Nausea & vomiting    Assessment & Plan: Adequately drained perineal abscess.  Packing removed.      Matt B. Daphine Deutscher, MD, [facility] [phone] beeper [phone]  10/17/2011 8:06 AM

## 2011-10-17 NOTE — Progress Notes (Signed)
Subjective:  Feeling stronger, want solid food ,no nv Objective Vital signs in last 24 hours: Filed Vitals:   10/16/11 2000 10/16/11 2059 10/17/11 0459 10/17/11 0938  BP: 145/83 155/83 116/65 130/68  Pulse: 73 68 62 71  Temp: 97.1 F (36.2 C) 98 F (36.7 C) 97.2 F (36.2 C)   TempSrc: Oral Oral Oral   Resp: 20 19 20 18   Height:  6\' 4"  (1.93 m)    Weight: 106.4 kg (234 lb 9.1 oz) 106.278 kg (234 lb 4.8 oz)    SpO2: 97% 97% 96% 93%   Weight change:   Intake/Output Summary (Last 24 hours) at 10/17/11 1034 Last data filed at 10/17/11 0900  Gross per 24 hour  Intake    860 ml  Output   1618 ml  Net   -758 ml   Labs: Basic Metabolic Panel:  Lab 10/17/11 1610 10/16/11 1555 10/16/11 1021 10/16/11 0732 10/14/11 1147  NA 140 -- -- 136 137  K 3.7 3.9 -- 4.4 --  CL 99 -- -- 98 99  CO2 30 -- -- 22 23  GLUCOSE 103* -- -- 483* 270*  BUN 24* -- -- 58* 69*  CREATININE 4.04* -- -- 6.75* 7.83*  CALCIUM 8.6 -- -- 9.2 9.2  ALB -- -- -- -- --  PHOS -- -- 5.9* -- --   Liver Function Tests:  Lab 10/17/11 0605  AST 6  ALT 7  ALKPHOS 37*  BILITOT 0.2*  PROT 6.0  ALBUMIN 2.7*   No results found for this basename: LIPASE:3,AMYLASE:3 in the last 168 hours No results found for this basename: AMMONIA:3 in the last 168 hours CBC:  Lab 10/17/11 0605 10/16/11 0732 10/14/11 1147  WBC 4.5 8.6 12.0*  NEUTROABS 2.1 6.5 --  HGB 7.2* 8.0* 8.5*  HCT 21.5* 23.9* 25.5*  MCV 92.7 92.3 92.1  PLT 133* 173 176   Cardiac Enzymes: No results found for this basename: CKTOTAL:5,CKMB:5,CKMBINDEX:5,TROPONINI:5 in the last 168 hours CBG:  Lab 10/17/11 0741 10/17/11 0409 10/17/11 0002 10/16/11 2056 10/16/11 1253  GLUCAP 128* 129* 141* 156* 350*    Iron Studies: No results found for this basename: IRON,TIBC,TRANSFERRIN,FERRITIN in the last 72 hours Studies/Results: Dg Abd Acute W/chest  10/16/2011  *RADIOLOGY REPORT*  Clinical Data: Vomiting.  Weakness.  ACUTE ABDOMEN SERIES (ABDOMEN 2 VIEW & CHEST  1 VIEW)  Comparison: Portable chest 05/02/2011.  Findings: There is stable mild cardiomegaly.  The lungs are clear. There is no pleural effusion or pneumothorax.  The bowel gas pattern is nonobstructive.  There is moderate stool throughout the colon.  No suspicious abdominal calcifications are demonstrated.  Small pelvic calcifications are likely phleboliths.  IMPRESSION: No acute cardiopulmonary or abdominal process identified.  Original Report Authenticated By: Gerrianne Scale, M.D.   Medications:      . amLODipine  10 mg Oral Daily  . atorvastatin  80 mg Oral QPM  . calcium acetate  2,001 mg Oral TID WC  . carvedilol  25 mg Oral BID  . cloNIDine  0.1 mg Oral BID  . clopidogrel  75 mg Oral Daily  . darbepoetin (ARANESP) injection - DIALYSIS  100 mcg Intravenous Q Wed-HD  . ferric gluconate (FERRLECIT/NULECIT) IV  125 mg Intravenous Q Mon-HD  . heparin  5,000 Units Subcutaneous Q8H  . insulin aspart  0-9 Units Subcutaneous Q4H  . insulin glargine  32 Units Subcutaneous QHS  . isosorbide mononitrate  120 mg Oral Daily  . labetalol  200 mg Oral BID  .  paricalcitol  9 mcg Intravenous 3 times weekly  . senna-docusate  2 tablet Oral BID  . sodium chloride  3 mL Intravenous Q12H  . vancomycin  1,500 mg Intravenous Once  . vancomycin  1,000 mg Intravenous Q M,W,F-HD  . DISCONTD: darbepoetin (ARANESP) injection - DIALYSIS  100 mcg Intravenous Q Wed-HD  . DISCONTD: erythromycin  250 mg Oral Q6H  . DISCONTD: insulin glargine  10 Units Subcutaneous QHS  . DISCONTD: isosorbide mononitrate  120 mg Oral Daily  . DISCONTD: labetalol  200 mg Oral BID  . DISCONTD: vancomycin  1,500 mg Intravenous Once   I  have reviewed scheduled and prn medications.  Physical Exam:  General: Alert, NAD, appropriate Heart: RRR, no rub, murmur, or gallop  Lungs: Clear too auscultation bilaterally, no rales , wheezing  Abdomen: bs+=, soft, nontender  Extremities: no pedal edema Dialysis Access: Positive bruit  L FA AVF   Problem/Plan: 1. ESRD - HD [facility] 2.  N/V= Resolved nowwith ? gastroparesis exacerbation with febrile /infectious illness= admit team rx 3.  Recent Labial Abscess =  [facility].noted packing removed and site healing per notes 4.  IDDM Type 1 / hyperglycemia on admit= per Admit team  5. Hypertension/volume - bp 130/68, wt . 104.3 kg yesterday then reck=107.9 ?? and edw 103.5  only 1030 uf post wt= 106.3 kg with hd/ on normal out pt meds hold for sbp less 110, tells me she takes all at homeLabetolol200mg  bid, Norvasc 10mg  hs, Clonidine 0.1mg  bid, Isosorbide 120mg  qd, Carvedilol 25mg  bid/ in attempt 4to 4.5kg as tolerated. 6.  Anemia - hgb 8.0 yesterday drop to 7.4 today / Aranesp in hospital / was on 1600 u epo and op hgs 9.8 10/09/11/ Was on Weekly iron / increas to 200aranesp. Fu hgb 7.  Metabolic bone disease - phoslo when taking pos and zemplar continue on hd. 8. Nutrition - high protein renal ,carbmod. Diet when eating  Lenny Pastel, PA-C [facility] Beeper [phone] 10/17/2011,10:34 AM  LOS: 1 day   Patient seen and examined and agree with assessment and plan as above. Feeling better, for discharge today. See above also.  Janet Moselle  MD [employer] [phone] pgr    [phone] cell 10/17/2011, 3:15 PM

## 2011-10-22 LAB — CULTURE, BLOOD (ROUTINE X 2): Culture: NO GROWTH

## 2011-11-07 ENCOUNTER — Ambulatory Visit (INDEPENDENT_AMBULATORY_CARE_PROVIDER_SITE_OTHER): Payer: 59 | Admitting: Physician Assistant

## 2011-11-07 ENCOUNTER — Encounter: Payer: Self-pay | Admitting: Physician Assistant

## 2011-11-07 VITALS — BP 147/77 | HR 85 | Ht 76.0 in | Wt 235.0 lb

## 2011-11-07 DIAGNOSIS — I2510 Atherosclerotic heart disease of native coronary artery without angina pectoris: Secondary | ICD-10-CM

## 2011-11-07 DIAGNOSIS — E785 Hyperlipidemia, unspecified: Secondary | ICD-10-CM

## 2011-11-07 DIAGNOSIS — I10 Essential (primary) hypertension: Secondary | ICD-10-CM

## 2011-11-07 DIAGNOSIS — E109 Type 1 diabetes mellitus without complications: Secondary | ICD-10-CM

## 2011-11-07 MED ORDER — CARVEDILOL 12.5 MG PO TABS: 12.5000 mg | ORAL_TABLET | Freq: Two times a day (BID) | ORAL | Status: DC

## 2011-11-07 MED ORDER — ASPIRIN EC 81 MG PO TBEC: 81.0000 mg | DELAYED_RELEASE_TABLET | Freq: Every day | ORAL | Status: AC

## 2011-11-07 NOTE — Progress Notes (Signed)
HPI: Patient presents for scheduled followup.  Patient returns with no interim development of CP. She reports compliance with her medications and with her HD schedule. She has noted, however, a downward trend in her SBP (70-100 range), on off-dialysis days. She has requested to have her antihypertensives down titrated.   Allergies  Allergen Reactions  . Cephalexin     REACTION: ?dz at young age  . Sulfamethoxazole W-Trimethoprim     REACTION: thrush    Current Outpatient Prescriptions  Medication Sig Dispense Refill  . amLODipine (NORVASC) 10 MG tablet Take 10 mg by mouth daily.      Marland Kitchen atorvastatin (LIPITOR) 80 MG tablet Take 80 mg by mouth every evening.      . calcium acetate (PHOSLO) 667 MG capsule Take 2,001 mg by mouth 3 (three) times daily with meals.      . carvedilol (COREG) 12.5 MG tablet Take 1 tablet (12.5 mg total) by mouth 2 (two) times daily.  60 tablet  6  . cloNIDine (CATAPRES) 0.1 MG tablet Take 0.1 mg by mouth 2 (two) times daily.      . clopidogrel (PLAVIX) 75 MG tablet Take 75 mg by mouth daily.      Marland Kitchen ibuprofen (ADVIL,MOTRIN) 200 MG tablet Take 600 mg by mouth every 8 (eight) hours as needed. For pain.      Marland Kitchen insulin glargine (LANTUS) 100 UNIT/ML injection Inject 32 Units into the skin at bedtime.      . insulin lispro (HUMALOG) 100 UNIT/ML injection Inject 6-8 Units into the skin 3 (three) times daily before meals. Per sliding scale      . isosorbide mononitrate (IMDUR) 120 MG 24 hr tablet Take 120 mg by mouth daily.      Marland Kitchen labetalol (NORMODYNE) 200 MG tablet Take 200 mg by mouth 2 (two) times daily.        . nitroGLYCERIN (NITROSTAT) 0.4 MG SL tablet Place 0.4 mg under the tongue every 5 (five) minutes as needed. For chest pain      . torsemide (DEMADEX) 20 MG tablet Take 20 mg by mouth 2 (two) times daily.        . vancomycin (VANCOCIN) 1 GM/200ML SOLN Inject 200 mLs (1,000 mg total) into the vein once. Once tomorrow in last 1 hr of dialysis session      .  DISCONTD: carvedilol (COREG) 25 MG tablet Take 25 mg by mouth 2 (two) times daily.        Marland Kitchen aspirin EC 81 MG tablet Take 1 tablet (81 mg total) by mouth daily.        Past Medical History  Diagnosis Date  . DM (diabetes mellitus)     Long-term insulin  . Hypertension   . Tobacco use disorder     Discontinued March 2012  . Hyperlipidemia     Hypertriglyceridemia 449 HDL 25  . Diabetic Charcot's joint disease   . Coronary artery disease     Status post cardiac catheterization June 2012 scattered coronary artery disease/atherosclerosis with 70-80% stenosis in a small right PDA.  . Gastroparesis   . Peripheral vascular disease     Tibial occlusive disease evaluated by Dr. Hart Rochester in August 2011. Medical therapy  . 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 CIN III (cervical intraepithelial neoplasia grade III) with severe dysplasia     S/P LEEP AND CONE  . Angina   . Chronic anemia     2nd to renal disease  . Iron deficiency anemia   .  End stage renal disease on dialysis     On dialysis since March 2012  . End stage renal disease on dialysis 05/02/11    [facility]; M; W, F; last time 05/01/11  . Migraines     "just on dialysis days"  . Peripheral neuropathy     related to DM  . 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 CHF (congestive heart failure)   . Shortness of breath 05/02/11    "resting"  . History of abscesses in groins 12/06/2010    Past Surgical History  Procedure Date  . Av fistula placement 04/2010  . Dilation and curettage of uterus 2009  . Cervical biopsy  w/ loop electrode excision     h/o  . Cervical cone biopsy     h/o    History   Social History  . Marital Status: Married    Spouse Name: [name]    Number of Children: [name]  . Years of Education: [name]   Occupational History  .  [employer]   Social History Main Topics  . Smoking status: Former Smoker -- 0.3 packs/day for 10 years    Types: Cigarettes    Quit date: 08/05/2011  . Smokeless tobacco: Never Used    Comment: "quit smoking cigarettes 07/2010"  . Alcohol Use: No  . Drug Use: No  . Sexually Active: Yes    Birth Control/ Protection: Condom   Other Topics Concern  . Not on file   Social History Narrative  . No narrative on file   Social History Narrative  . No narrative on file    Problem Relation Age of Onset  . Diabetes Mother   . Diabetes Father   . Diabetes Sister   . Breast cancer Maternal Aunt   . Cancer Maternal Aunt     breast    ROS: no nausea, vomiting; no fever, chills; no melena, hematochezia; no claudication  PHYSICAL EXAM: BP 147/77  Pulse 85  Ht 6\' 4"  (1.93 m)  Wt 235 lb (106.595 kg)  BMI 28.61 kg/m2  LMP 09/23/2011 GENERAL: 36 year old female; NAD  HEENT: NCAT, PERRLA, EOMI; sclera clear; no xanthelasma  NECK: palpable bilateral carotid pulses, no bruits; no JVD; no TM  LUNGS: CTA bilaterally  CARDIAC: RRR (S1, S2); no significant murmurs; no rubs or gallops  ABDOMEN: soft, non-tender; intact BS  EXTREMETIES: intact distal pulses; no significant peripheral edema  SKIN: warm/dry; no obvious rash/lesions  MUSCULOSKELETAL: no joint deformity  NEURO: no focal deficit; NL affect   EKG: reviewed and available in Electronic Records   ASSESSMENT & PLAN:  Coronary artery disease Quiescent on current medication regimen. Status post recent NST EMI treated medically, with followup low risk Myoview study. Plan return visit with Dr. Andee Lineman. in 6 months  Essential hypertension, benign Will decrease carvedilol to 12.5 mg twice a day, in light of reported SBP readings of 70-100 range at home, on off-dialysis days.  Hyperlipidemia Will request most recent lipid profile for close monitoring. Aggressive management recommended with target LDL 70 or less, if feasible. Continue high dose Lipitor, pending further recommendations.  IDDM Followed by Dr. Amada Kingfisher, [facility]

## 2011-11-07 NOTE — Assessment & Plan Note (Signed)
Will request most recent lipid profile for close monitoring. Aggressive management recommended with target LDL 70 or less, if feasible. Continue high dose Lipitor, pending further recommendations.

## 2011-11-07 NOTE — Patient Instructions (Signed)
   Aspirin 81mg  daily  Decrease Coreg to 12.5mg  twice a day   Your physician wants you to follow up in: 6 months.  You will receive a reminder letter in the mail one-two months in advance.  If you don't receive a letter, please call our office to schedule the follow up appointment

## 2011-11-07 NOTE — Assessment & Plan Note (Signed)
Will decrease carvedilol to 12.5 mg twice a day, in light of reported SBP readings of 70-100 range at home, on off-dialysis days.

## 2011-11-07 NOTE — Assessment & Plan Note (Signed)
Followed by Dr. Vyas 

## 2011-11-07 NOTE — Assessment & Plan Note (Signed)
Quiescent on current medication regimen. Status post recent NST EMI treated medically, with followup low risk Myoview study. Plan return visit with Dr. Andee Lineman. in 6 months

## 2011-12-10 ENCOUNTER — Telehealth: Payer: Self-pay | Admitting: *Deleted

## 2011-12-10 DIAGNOSIS — E785 Hyperlipidemia, unspecified: Secondary | ICD-10-CM

## 2011-12-10 DIAGNOSIS — Z79899 Other long term (current) drug therapy: Secondary | ICD-10-CM

## 2011-12-10 NOTE — Telephone Encounter (Signed)
[name] called to cancel her appointment with D. Degent on 7-18. Will re-schedule at later time. She wanted to know if she is suppose to be having labs drawn ? Please call [phone].

## 2011-12-10 NOTE — Telephone Encounter (Signed)
Discussed below with patient.  Please see labs that are in from [facility].  Was just seen 6/13 by GS.  She is questioning if she needs to have liver testing done.  States it was discussed at OV, but no one has notified her.  Also, we were suppose to have gotten dialysis labs.

## 2011-12-11 NOTE — Telephone Encounter (Signed)
Patient notified of below.  Patient lives in [place], but is seen in [place].  She prefers to do labs there.

## 2011-12-11 NOTE — Addendum Note (Signed)
Addended by: Lesle Chris on: 12/11/2011 02:52 PM   Modules accepted: Orders

## 2011-12-11 NOTE — Telephone Encounter (Signed)
Yes. Recent labs from [facility], 5/13, were reviewed. LFTs stable. Pt needs FLP.

## 2011-12-12 ENCOUNTER — Ambulatory Visit: Payer: 59 | Admitting: Cardiology

## 2011-12-17 ENCOUNTER — Ambulatory Visit (INDEPENDENT_AMBULATORY_CARE_PROVIDER_SITE_OTHER): Payer: 59 | Admitting: *Deleted

## 2011-12-17 DIAGNOSIS — Z79899 Other long term (current) drug therapy: Secondary | ICD-10-CM

## 2011-12-17 DIAGNOSIS — E785 Hyperlipidemia, unspecified: Secondary | ICD-10-CM

## 2011-12-17 LAB — LIPID PANEL
HDL: 34 mg/dL — ABNORMAL LOW (ref >39.00)
Total CHOL/HDL Ratio: 5
Triglycerides: 173 mg/dL — ABNORMAL HIGH (ref 0.0–149.0)

## 2011-12-19 ENCOUNTER — Encounter: Payer: Self-pay | Admitting: *Deleted

## 2011-12-19 NOTE — Progress Notes (Signed)
Patient ID: [name], female   DOB: 04-Sep-1975, 36 y.o.   MRN: [id number]   LDL 99, not at goal. However, pt on full dose Lipitor. Will discuss with her possibly switching to Crestor, at next OV. Gene   ----- Message ----- From: Lesle Chris, LPN Sent: 09/02/8117 11:57 AM To: Prescott Parma, PA   Patient notified of above.

## 2012-01-26 ENCOUNTER — Encounter (HOSPITAL_COMMUNITY): Payer: Self-pay | Admitting: Emergency Medicine

## 2012-01-26 ENCOUNTER — Emergency Department (HOSPITAL_COMMUNITY): Payer: 59

## 2012-01-26 ENCOUNTER — Observation Stay (HOSPITAL_COMMUNITY)
Admission: EM | Admit: 2012-01-26 | Discharge: 2012-01-27 | Disposition: A | Discharge status: Home / Self Care | Payer: 59 | Attending: Internal Medicine | Admitting: Internal Medicine

## 2012-01-26 DIAGNOSIS — E119 Type 2 diabetes mellitus without complications: Secondary | ICD-10-CM | POA: Insufficient documentation

## 2012-01-26 DIAGNOSIS — Z794 Long term (current) use of insulin: Secondary | ICD-10-CM | POA: Insufficient documentation

## 2012-01-26 DIAGNOSIS — E785 Hyperlipidemia, unspecified: Secondary | ICD-10-CM | POA: Insufficient documentation

## 2012-01-26 DIAGNOSIS — R0602 Shortness of breath: Secondary | ICD-10-CM | POA: Insufficient documentation

## 2012-01-26 DIAGNOSIS — D649 Anemia, unspecified: Secondary | ICD-10-CM

## 2012-01-26 DIAGNOSIS — I10 Essential (primary) hypertension: Secondary | ICD-10-CM | POA: Diagnosis present

## 2012-01-26 DIAGNOSIS — R079 Chest pain, unspecified: Principal | ICD-10-CM | POA: Insufficient documentation

## 2012-01-26 DIAGNOSIS — I120 Hypertensive chronic kidney disease with stage 5 chronic kidney disease or end stage renal disease: Secondary | ICD-10-CM | POA: Insufficient documentation

## 2012-01-26 DIAGNOSIS — E109 Type 1 diabetes mellitus without complications: Secondary | ICD-10-CM | POA: Diagnosis present

## 2012-01-26 DIAGNOSIS — N186 End stage renal disease: Secondary | ICD-10-CM | POA: Insufficient documentation

## 2012-01-26 DIAGNOSIS — I2510 Atherosclerotic heart disease of native coronary artery without angina pectoris: Secondary | ICD-10-CM | POA: Insufficient documentation

## 2012-01-26 DIAGNOSIS — Z992 Dependence on renal dialysis: Secondary | ICD-10-CM | POA: Insufficient documentation

## 2012-01-26 LAB — POCT I-STAT TROPONIN I: Troponin i, poc: 0.04 ng/mL (ref 0.00–0.08)

## 2012-01-26 LAB — BASIC METABOLIC PANEL
Calcium: 9 mg/dL (ref 8.4–10.5)
Creatinine, Ser: 7.46 mg/dL — ABNORMAL HIGH (ref 0.50–1.10)
GFR calc Af Amer: 7 mL/min — ABNORMAL LOW (ref >90)
GFR calc non Af Amer: 6 mL/min — ABNORMAL LOW (ref >90)
Sodium: 143 mEq/L (ref 135–145)

## 2012-01-26 LAB — CBC
MCH: 30.7 pg (ref 26.0–34.0)
MCHC: 31.5 g/dL (ref 30.0–36.0)
Platelets: 219 10*3/uL (ref 150–400)
RBC: 3.48 MIL/uL — ABNORMAL LOW (ref 3.87–5.11)
RDW: 14.6 % (ref 11.5–15.5)

## 2012-01-26 MED ORDER — HYDROMORPHONE HCL PF 1 MG/ML IJ SOLN
1.0000 mg | Freq: Once | INTRAMUSCULAR | Status: AC
  Administered 2012-01-26: 1 mg via INTRAVENOUS
  Filled 2012-01-26: qty 1

## 2012-01-26 MED ORDER — SODIUM CHLORIDE 0.9 % IV SOLN: Freq: Once | INTRAVENOUS | Status: DC

## 2012-01-26 MED ORDER — ONDANSETRON HCL 4 MG/2ML IJ SOLN
4.0000 mg | Freq: Once | INTRAMUSCULAR | Status: AC
  Administered 2012-01-26: 4 mg via INTRAVENOUS
  Filled 2012-01-26: qty 2

## 2012-01-26 NOTE — ED Notes (Signed)
C/o L sided chest pain since last night that radiates down L arm with nausea, vomiting, and diaphoresis.

## 2012-01-26 NOTE — ED Notes (Signed)
Pt comes to the ED complaining of left sided cp with radiation to the left arm.  Pain has been persistent for the past day and a half.  Pt states she has had increasing sob since today with nausea and episodes of emesis x2.  Pt was sleeping when cp started.  She reports taking one 81 mg baby aspirin today.   She is a dialysis pt dialysis is seen on MWF.  Last treatment was on Friday and states she was unable to complete dialysis.

## 2012-01-26 NOTE — ED Notes (Signed)
New and old ECG's were shown to Dr. Lorenso Courier.

## 2012-01-27 ENCOUNTER — Encounter (HOSPITAL_COMMUNITY): Payer: Self-pay

## 2012-01-27 ENCOUNTER — Observation Stay (HOSPITAL_COMMUNITY): Payer: 59

## 2012-01-27 DIAGNOSIS — R079 Chest pain, unspecified: Secondary | ICD-10-CM

## 2012-01-27 DIAGNOSIS — D649 Anemia, unspecified: Secondary | ICD-10-CM

## 2012-01-27 DIAGNOSIS — N186 End stage renal disease: Secondary | ICD-10-CM

## 2012-01-27 LAB — BASIC METABOLIC PANEL
BUN: 61 mg/dL — ABNORMAL HIGH (ref 6–23)
CO2: 22 mEq/L (ref 19–32)
Chloride: 103 mEq/L (ref 96–112)
Creatinine, Ser: 7.14 mg/dL — ABNORMAL HIGH (ref 0.50–1.10)
GFR calc Af Amer: 8 mL/min — ABNORMAL LOW (ref >90)
Glucose, Bld: 83 mg/dL (ref 70–99)

## 2012-01-27 LAB — GLUCOSE, CAPILLARY
Glucose-Capillary: 115 mg/dL — ABNORMAL HIGH (ref 70–99)
Glucose-Capillary: 95 mg/dL (ref 70–99)
Glucose-Capillary: 95 mg/dL (ref 70–99)

## 2012-01-27 LAB — CBC
HCT: 31.5 % — ABNORMAL LOW (ref 36.0–46.0)
HCT: 31.4 % — ABNORMAL LOW (ref 36.0–46.0)
MCH: 31.3 pg (ref 26.0–34.0)
MCHC: 31.7 g/dL (ref 30.0–36.0)
MCHC: 31.8 g/dL (ref 30.0–36.0)
MCV: 98.1 fL (ref 78.0–100.0)
RDW: 14.5 % (ref 11.5–15.5)
RDW: 14.5 % (ref 11.5–15.5)

## 2012-01-27 LAB — PRO B NATRIURETIC PEPTIDE: Pro B Natriuretic peptide (BNP): 15640 pg/mL — ABNORMAL HIGH (ref 0–125)

## 2012-01-27 LAB — LIPID PANEL
HDL: 28 mg/dL — ABNORMAL LOW (ref >39)
Total CHOL/HDL Ratio: 4.2 RATIO

## 2012-01-27 LAB — CREATININE, SERUM: GFR calc non Af Amer: 7 mL/min — ABNORMAL LOW (ref >90)

## 2012-01-27 LAB — TROPONIN I: Troponin I: <0.3 ng/mL (ref <0.30)

## 2012-01-27 MED ORDER — DEXTROSE 50 % IV SOLN
1.0000 | Freq: Once | INTRAVENOUS | Status: AC
  Administered 2012-01-27: 50 mL via INTRAVENOUS
  Filled 2012-01-27: qty 50

## 2012-01-27 MED ORDER — LABETALOL HCL 200 MG PO TABS
200.0000 mg | ORAL_TABLET | Freq: Two times a day (BID) | ORAL | Status: DC
  Filled 2012-01-27 (×2): qty 1

## 2012-01-27 MED ORDER — LIDOCAINE HCL (PF) 1 % IJ SOLN: 5.0000 mL | INTRAMUSCULAR | Status: DC | PRN

## 2012-01-27 MED ORDER — NITROGLYCERIN 0.6 MG/HR TD PT24
0.6000 mg | MEDICATED_PATCH | Freq: Every day | TRANSDERMAL | Status: DC
  Administered 2012-01-27: 0.6 mg via TRANSDERMAL
  Filled 2012-01-27: qty 1

## 2012-01-27 MED ORDER — CLOPIDOGREL BISULFATE 75 MG PO TABS
75.0000 mg | ORAL_TABLET | Freq: Every day | ORAL | Status: DC
  Administered 2012-01-27: 75 mg via ORAL
  Filled 2012-01-27: qty 1

## 2012-01-27 MED ORDER — INSULIN ASPART 100 UNIT/ML ~~LOC~~ SOLN: 0.0000 [IU] | Freq: Three times a day (TID) | SUBCUTANEOUS | Status: DC

## 2012-01-27 MED ORDER — INSULIN GLARGINE 100 UNIT/ML ~~LOC~~ SOLN: 26.0000 [IU] | Freq: Every day | SUBCUTANEOUS | Status: DC

## 2012-01-27 MED ORDER — HEPARIN SODIUM (PORCINE) 1000 UNIT/ML DIALYSIS
20.0000 [IU]/kg | INTRAMUSCULAR | Status: DC | PRN
  Filled 2012-01-27: qty 3

## 2012-01-27 MED ORDER — HYDROXYZINE HCL 25 MG PO TABS
25.0000 mg | ORAL_TABLET | Freq: Three times a day (TID) | ORAL | Status: DC | PRN
  Filled 2012-01-27: qty 1

## 2012-01-27 MED ORDER — ZOLPIDEM TARTRATE 5 MG PO TABS: 5.0000 mg | ORAL_TABLET | Freq: Every evening | ORAL | Status: DC | PRN

## 2012-01-27 MED ORDER — NEPRO/CARBSTEADY PO LIQD
237.0000 mL | Freq: Three times a day (TID) | ORAL | Status: DC | PRN
  Filled 2012-01-27: qty 237

## 2012-01-27 MED ORDER — PARICALCITOL 5 MCG/ML IV SOLN
8.0000 ug | INTRAVENOUS | Status: DC
  Administered 2012-01-27: 8 ug via INTRAVENOUS
  Filled 2012-01-27: qty 1.6

## 2012-01-27 MED ORDER — DOCUSATE SODIUM 283 MG RE ENEM
1.0000 | ENEMA | RECTAL | Status: DC | PRN
  Filled 2012-01-27: qty 1

## 2012-01-27 MED ORDER — CLONIDINE HCL 0.1 MG PO TABS
0.1000 mg | ORAL_TABLET | Freq: Two times a day (BID) | ORAL | Status: DC
  Administered 2012-01-27: 0.1 mg via ORAL
  Filled 2012-01-27 (×2): qty 1

## 2012-01-27 MED ORDER — ACETAMINOPHEN 325 MG PO TABS
650.0000 mg | ORAL_TABLET | Freq: Four times a day (QID) | ORAL | Status: DC | PRN
  Administered 2012-01-27 (×2): 650 mg via ORAL
  Filled 2012-01-27 (×2): qty 2

## 2012-01-27 MED ORDER — ALUM & MAG HYDROXIDE-SIMETH 200-200-20 MG/5ML PO SUSP: 30.0000 mL | Freq: Four times a day (QID) | ORAL | Status: DC | PRN

## 2012-01-27 MED ORDER — PENTAFLUOROPROP-TETRAFLUOROETH EX AERO: 1.0000 "application " | INHALATION_SPRAY | CUTANEOUS | Status: DC | PRN

## 2012-01-27 MED ORDER — CARVEDILOL 6.25 MG PO TABS
18.7500 mg | ORAL_TABLET | Freq: Two times a day (BID) | ORAL | Status: DC
  Administered 2012-01-27: 18.75 mg via ORAL
  Filled 2012-01-27 (×2): qty 1

## 2012-01-27 MED ORDER — SODIUM CHLORIDE 0.9 % IV SOLN: 125.0000 mg | INTRAVENOUS | Status: DC

## 2012-01-27 MED ORDER — ASPIRIN 325 MG PO TABS
325.0000 mg | ORAL_TABLET | Freq: Every day | ORAL | Status: DC
  Administered 2012-01-27: 325 mg via ORAL
  Filled 2012-01-27: qty 1

## 2012-01-27 MED ORDER — ONDANSETRON HCL 4 MG/2ML IJ SOLN: 4.0000 mg | Freq: Four times a day (QID) | INTRAMUSCULAR | Status: DC | PRN

## 2012-01-27 MED ORDER — ONDANSETRON HCL 8 MG PO TABS
4.0000 mg | ORAL_TABLET | Freq: Four times a day (QID) | ORAL | Status: DC | PRN
  Filled 2012-01-27: qty 0.5

## 2012-01-27 MED ORDER — SODIUM CHLORIDE 0.9 % IJ SOLN
3.0000 mL | Freq: Two times a day (BID) | INTRAMUSCULAR | Status: DC
  Administered 2012-01-27: 3 mL via INTRAVENOUS

## 2012-01-27 MED ORDER — CARVEDILOL 12.5 MG PO TABS: 18.7500 mg | ORAL_TABLET | Freq: Two times a day (BID) | ORAL | Status: DC

## 2012-01-27 MED ORDER — ATORVASTATIN CALCIUM 80 MG PO TABS
80.0000 mg | ORAL_TABLET | Freq: Every evening | ORAL | Status: DC
  Administered 2012-01-27: 80 mg via ORAL
  Filled 2012-01-27: qty 1

## 2012-01-27 MED ORDER — SODIUM CHLORIDE 0.9 % IV SOLN: 100.0000 mL | INTRAVENOUS | Status: DC | PRN

## 2012-01-27 MED ORDER — AMLODIPINE BESYLATE 10 MG PO TABS
10.0000 mg | ORAL_TABLET | Freq: Every day | ORAL | Status: DC
  Administered 2012-01-27: 10 mg via ORAL
  Filled 2012-01-27: qty 1

## 2012-01-27 MED ORDER — SENNOSIDES-DOCUSATE SODIUM 8.6-50 MG PO TABS
1.0000 | ORAL_TABLET | Freq: Every evening | ORAL | Status: DC | PRN
  Filled 2012-01-27: qty 1

## 2012-01-27 MED ORDER — DARBEPOETIN ALFA-POLYSORBATE 25 MCG/0.42ML IJ SOLN
INTRAMUSCULAR | Status: AC
  Administered 2012-01-27: 6.5476 ug via INTRAVENOUS
  Filled 2012-01-27: qty 0.42

## 2012-01-27 MED ORDER — ALTEPLASE 2 MG IJ SOLR
2.0000 mg | Freq: Once | INTRAMUSCULAR | Status: DC | PRN
  Filled 2012-01-27: qty 2

## 2012-01-27 MED ORDER — SODIUM CHLORIDE 0.9 % IV SOLN: 250.0000 mL | INTRAVENOUS | Status: DC | PRN

## 2012-01-27 MED ORDER — CALCIUM CARBONATE 1250 MG/5ML PO SUSP
500.0000 mg | Freq: Four times a day (QID) | ORAL | Status: DC | PRN
  Filled 2012-01-27: qty 5

## 2012-01-27 MED ORDER — HEPARIN SODIUM (PORCINE) 5000 UNIT/ML IJ SOLN
5000.0000 [IU] | Freq: Three times a day (TID) | INTRAMUSCULAR | Status: DC
  Administered 2012-01-27: 5000 [IU] via SUBCUTANEOUS
  Filled 2012-01-27 (×4): qty 1

## 2012-01-27 MED ORDER — SODIUM CHLORIDE 0.9 % IJ SOLN: 3.0000 mL | INTRAMUSCULAR | Status: DC | PRN

## 2012-01-27 MED ORDER — PARICALCITOL 5 MCG/ML IV SOLN
INTRAVENOUS | Status: AC
  Administered 2012-01-27: 8 ug via INTRAVENOUS
  Filled 2012-01-27: qty 2

## 2012-01-27 MED ORDER — HEPARIN SODIUM (PORCINE) 1000 UNIT/ML DIALYSIS
1000.0000 [IU] | INTRAMUSCULAR | Status: DC | PRN
  Filled 2012-01-27: qty 1

## 2012-01-27 MED ORDER — SORBITOL 70 % SOLN
30.0000 mL | Status: DC | PRN
  Filled 2012-01-27: qty 30

## 2012-01-27 MED ORDER — DARBEPOETIN ALFA-POLYSORBATE 25 MCG/0.42ML IJ SOLN
6.2500 ug | INTRAMUSCULAR | Status: DC
  Administered 2012-01-27: 6.5476 ug via INTRAVENOUS
  Filled 2012-01-27: qty 0.42

## 2012-01-27 MED ORDER — LIDOCAINE-PRILOCAINE 2.5-2.5 % EX CREA
1.0000 "application " | TOPICAL_CREAM | CUTANEOUS | Status: DC | PRN
  Filled 2012-01-27: qty 5

## 2012-01-27 MED ORDER — CAMPHOR-MENTHOL 0.5-0.5 % EX LOTN
1.0000 "application " | TOPICAL_LOTION | Freq: Three times a day (TID) | CUTANEOUS | Status: DC | PRN
  Filled 2012-01-27: qty 222

## 2012-01-27 MED ORDER — INSULIN ASPART 100 UNIT/ML ~~LOC~~ SOLN: 0.0000 [IU] | Freq: Every day | SUBCUTANEOUS | Status: DC

## 2012-01-27 MED ORDER — ACETAMINOPHEN 650 MG RE SUPP: 650.0000 mg | Freq: Four times a day (QID) | RECTAL | Status: DC | PRN

## 2012-01-27 MED ORDER — CALCIUM ACETATE 667 MG PO CAPS
2001.0000 mg | ORAL_CAPSULE | Freq: Three times a day (TID) | ORAL | Status: DC
  Administered 2012-01-27: 2001 mg via ORAL
  Filled 2012-01-27 (×4): qty 3

## 2012-01-27 MED ORDER — ONDANSETRON HCL 4 MG PO TABS: 4.0000 mg | ORAL_TABLET | Freq: Four times a day (QID) | ORAL | Status: DC | PRN

## 2012-01-27 MED ORDER — INSULIN GLARGINE 100 UNIT/ML ~~LOC~~ SOLN
28.0000 [IU] | Freq: Every day | SUBCUTANEOUS | Status: DC
  Administered 2012-01-27: 28 [IU] via SUBCUTANEOUS

## 2012-01-27 MED ORDER — ISOSORBIDE MONONITRATE ER 60 MG PO TB24
120.0000 mg | ORAL_TABLET | Freq: Every day | ORAL | Status: DC
  Administered 2012-01-27: 120 mg via ORAL
  Filled 2012-01-27: qty 2

## 2012-01-27 MED ORDER — CARVEDILOL 12.5 MG PO TABS
12.5000 mg | ORAL_TABLET | Freq: Two times a day (BID) | ORAL | Status: DC
  Filled 2012-01-27 (×2): qty 1

## 2012-01-27 NOTE — Procedures (Signed)
Pt seen on HD.  Ap 150  VP 160.  Weight up 5 kilos and BP high so will try for 5 kilos. On 2K bath.

## 2012-01-27 NOTE — Consult Note (Addendum)
[facility] Renal Consultation Note    Indication for Consultation:  Management of ESRD/hemodialysis; anemia, hypertension/volume and secondary hyperparathyroidism  HPI: [name] is a 36 y.o. female with ESRD secondary to diabetes on HD since March 2012 with known CAD followed by [facility] cardiology.  She presented with CP yesterday unrelieved by 1 NTG.  She denied N, V, diaphoresis, SOB, LE edema or pain radiation.  She does not know what precipitated this or what made it go away.  Her last dialysis treatment was 8/30. She signed off early and received about 70% of her treatment with a post weight of 107 (EDW of 106). Post HD BPs wer 160 - 175 systolic range.  She signed off due to a dialysis related headache which is a chronic problem for her.    Past Medical History  Diagnosis Date  . DM (diabetes mellitus)     Long-term insulin  . Hypertension   . Tobacco use disorder     Discontinued March 2012  . Hyperlipidemia     Hypertriglyceridemia 449 HDL 25  . Diabetic Charcot's joint disease   . Coronary artery disease     Status post cardiac catheterization June 2012 scattered coronary artery disease/atherosclerosis with 70-80% stenosis in a small right PDA.  . Gastroparesis   . Peripheral vascular disease     Tibial occlusive disease evaluated by Dr. Hart Rochester in August 2011. Medical therapy  . 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 CIN III (cervical intraepithelial neoplasia grade III) with severe dysplasia     S/P LEEP AND CONE  . Angina   . Chronic anemia     2nd to renal disease  . Iron deficiency anemia   . End stage renal disease on dialysis     On dialysis since March 2012  . End stage renal disease on dialysis 05/02/11    [facility]; M; W, F; last time 05/01/11  . Migraines     "just on dialysis days"  . Peripheral neuropathy     related to DM  . 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 CHF (congestive heart failure)   . Shortness of breath 05/02/11    "resting"    . History of abscesses in groins 12/06/2010   Past Surgical History  Procedure Date  . Av fistula placement 04/2010  . Dilation and curettage of uterus 2009  . Cervical biopsy  w/ loop electrode excision     h/o  . Cervical cone biopsy     h/o   Family History  Problem Relation Age of Onset  . Diabetes Mother   . Diabetes Father   . Diabetes Sister   . Breast cancer Maternal Aunt   . Cancer Maternal Aunt     breast   Social History:  reports that she quit smoking about 5 months ago. Her smoking use included Cigarettes. She has a 3 pack-year smoking history. She has never used smokeless tobacco. She reports that she does not drink alcohol or use illicit drugs. Allergies  Allergen Reactions  . Cephalexin     REACTION: ?dz at young age  . Sulfamethoxazole W-Trimethoprim     REACTION: thrush   Prior to Admission medications   Medication Sig Start Date End Date Taking? Authorizing Provider  amLODipine (NORVASC) 10 MG tablet Take 10 mg by mouth daily.   Yes Historical Provider, MD  aspirin EC 81 MG tablet Take 1 tablet (81 mg total) by mouth daily. 11/07/11 11/06/12 Yes Prescott Parma, PA  atorvastatin (LIPITOR) 80 MG tablet Take 80 mg by mouth every evening.   Yes Historical Provider, MD  calcium acetate (PHOSLO) 667 MG capsule Take 2,001 mg by mouth 3 (three) times daily with meals.   Yes Historical Provider, MD  carvedilol (COREG) 12.5 MG tablet Take 1 tablet (12.5 mg total) by mouth 2 (two) times daily. 11/07/11  Yes Prescott Parma, PA  cloNIDine (CATAPRES) 0.1 MG tablet Take 0.1 mg by mouth 2 (two) times daily.   Yes Historical Provider, MD  clopidogrel (PLAVIX) 75 MG tablet Take 75 mg by mouth daily.   Yes Historical Provider, MD  ibuprofen (ADVIL,MOTRIN) 200 MG tablet Take 600 mg by mouth every 8 (eight) hours as needed. For pain.   Yes Historical Provider, MD  insulin glargine (LANTUS) 100 UNIT/ML injection Inject 26-28 Units into the skin 2 (two) times daily. 28 units in the am  and 26 units in the pm   Yes Historical Provider, MD  insulin lispro (HUMALOG) 100 UNIT/ML injection Inject 6-8 Units into the skin 3 (three) times daily before meals. Per sliding scale   Yes Historical Provider, MD  isosorbide mononitrate (IMDUR) 120 MG 24 hr tablet Take 120 mg by mouth daily.   Yes Historical Provider, MD  labetalol (NORMODYNE) 200 MG tablet Take 200 mg by mouth 2 (two) times daily.     Yes Historical Provider, MD  nitroGLYCERIN (NITROSTAT) 0.4 MG SL tablet Place 0.4 mg under the tongue every 5 (five) minutes as needed. For chest pain   Yes Historical Provider, MD   Current Facility-Administered Medications  Medication Dose Route Frequency Provider Last Rate Last Dose  . 0.9 %  sodium chloride infusion  100 mL Intravenous PRN Sheffield Slider, PA      . 0.9 %  sodium chloride infusion  100 mL Intravenous PRN Sheffield Slider, PA      . acetaminophen (TYLENOL) tablet 650 mg  650 mg Oral Q6H PRN Sheffield Slider, PA       Or  . acetaminophen (TYLENOL) suppository 650 mg  650 mg Rectal Q6H PRN Sheffield Slider, PA      . alteplase (CATHFLO ACTIVASE) injection 2 mg  2 mg Intracatheter Once PRN Sheffield Slider, PA      . alum & mag hydroxide-simeth (MAALOX/MYLANTA) 200-200-20 MG/5ML suspension 30 mL  30 mL Oral Q6H PRN Debby Crosley, MD      . amLODipine (NORVASC) tablet 10 mg  10 mg Oral Daily Debby Crosley, MD      . aspirin tablet 325 mg  325 mg Oral Daily Debby Crosley, MD      . atorvastatin (LIPITOR) tablet 80 mg  80 mg Oral QPM Debby Crosley, MD      . calcium acetate (PHOSLO) capsule 2,001 mg  2,001 mg Oral TID WC Debby Crosley, MD      . calcium carbonate (dosed in mg elemental calcium) suspension 500 mg of elemental calcium  500 mg of elemental calcium Oral Q6H PRN Sheffield Slider, PA      . camphor-menthol [facility]) lotion 1 application  1 application Topical Q8H PRN Sheffield Slider, PA       And  . hydrOXYzine (ATARAX/VISTARIL) tablet 25 mg  25 mg Oral Q8H PRN  Sheffield Slider, PA      . carvedilol (COREG) tablet 18.75 mg  18.75 mg Oral BID Sorin Luanne Bras, MD      . cloNIDine (CATAPRES) tablet 0.1 mg  0.1 mg Oral  BID Debby Crosley, MD      . clopidogrel (PLAVIX) tablet 75 mg  75 mg Oral Daily Debby Crosley, MD      . darbepoetin (ARANESP) injection 6.5476 mcg  6.5476 mcg Intravenous Q Mon-HD Sheffield Slider, PA      . dextrose 50 % solution 50 mL  1 ampule Intravenous Once Gerhard Munch, MD   50 mL at 01/27/12 0029  . docusate sodium (ENEMEEZ) enema 283 mg  1 enema Rectal PRN Sheffield Slider, PA      . feeding supplement (NEPRO CARB STEADY) liquid 237 mL  237 mL Oral TID PRN Sheffield Slider, PA      . ferric gluconate (NULECIT) 125 mg in sodium chloride 0.9 % 100 mL IVPB  125 mg Intravenous Q Wed-HD Sheffield Slider, PA      . heparin injection 1,000 Units  1,000 Units Dialysis PRN Sheffield Slider, PA      . heparin injection 2,200 Units  20 Units/kg Dialysis PRN Sheffield Slider, PA      . heparin injection 5,000 Units  5,000 Units Subcutaneous Q8H Gery Pray, MD   5,000 Units at 01/27/12 0620  . HYDROmorphone (DILAUDID) injection 1 mg  1 mg Intravenous Once Gerhard Munch, MD   1 mg at 01/26/12 2350  . insulin aspart (novoLOG) injection 0-15 Units  0-15 Units Subcutaneous TID WC Debby Crosley, MD      . insulin aspart (novoLOG) injection 0-5 Units  0-5 Units Subcutaneous QHS Debby Crosley, MD      . insulin glargine (LANTUS) injection 26 Units  26 Units Subcutaneous QHS Debby Crosley, MD      . insulin glargine (LANTUS) injection 28 Units  28 Units Subcutaneous Daily Debby Crosley, MD      . isosorbide mononitrate (IMDUR) 24 hr tablet 120 mg  120 mg Oral Daily Debby Crosley, MD      . labetalol (NORMODYNE) tablet 200 mg  200 mg Oral BID Debby Crosley, MD      . lidocaine (XYLOCAINE) 1 % injection 5 mL  5 mL Intradermal PRN Sheffield Slider, PA      . lidocaine-prilocaine (EMLA) cream 1 application  1 application Topical PRN Sheffield Slider, PA      . nitroGLYCERIN (NITRODUR - Dosed in mg/24 hr) patch 0.6 mg  0.6 mg Transdermal Daily Debby Crosley, MD      . ondansetron (ZOFRAN) injection 4 mg  4 mg Intravenous Once Gerhard Munch, MD   4 mg at 01/26/12 2350  . ondansetron (ZOFRAN) tablet 4 mg  4 mg Oral Q6H PRN Sheffield Slider, PA       Or  . ondansetron (ZOFRAN) injection 4 mg  4 mg Intravenous Q6H PRN Sheffield Slider, PA      . pentafluoroprop-tetrafluoroeth (GEBAUERS) aerosol 1 application  1 application Topical PRN Sheffield Slider, PA      . sorbitol 70 % solution 30 mL  30 mL Oral PRN Sheffield Slider, PA      . zolpidem (AMBIEN) tablet 5 mg  5 mg Oral QHS PRN Sheffield Slider, PA       Labs: Basic Metabolic Panel:  Lab 01/27/12 1191 01/27/12 0245 01/26/12 2220  NA 140 -- 143  K 5.0 -- 4.2  CL 103 -- 102  CO2 22 -- 25  GLUCOSE 83 -- 171*  BUN 61* -- 60*  CREATININE 7.14* 7.15* 7.46*  CALCIUM 8.2* -- 9.0  ALB -- -- --  PHOS -- -- --   CBC:  Lab 01/27/12 0435 01/27/12 0245 01/26/12 2220  WBC 12.5* 13.0* 12.8*  NEUTROABS -- -- --  HGB 10.0* 10.0* 10.7*  HCT 31.4* 31.5* 34.0*  MCV 98.1 97.5 97.7  PLT 183 170 219   Cardiac Enzymes:  Lab 01/27/12 0804 01/27/12 0244  CKTOTAL -- --  CKMB -- --  CKMBINDEX -- --  TROPONINI <0.30 <0.30   CBG:  Lab 01/27/12 0631 01/27/12 0341 01/27/12 0055 01/27/12 0016 01/27/12 0009  GLUCAP 85 88 95 33* 39*   IStudies/Results: Dg Chest 2 View  01/26/2012  *RADIOLOGY REPORT*  Clinical Data: Shortness of breath, chest pain, hypertension, diabetes  CHEST - 2 VIEW  Comparison: 05/02/2011  Findings: Enlargement of cardiac silhouette. Mediastinal contours and pulmonary vascularity normal. Chronic bronchitic and interstitial lung disease changes. No acute infiltrate, pleural effusion or pneumothorax. No acute osseous findings.  IMPRESSION: Enlargement of cardiac silhouette with pulmonary vascular congestion. Mild chronic bronchitic and interstitial lung disease  changes question minimal chronic failure. No acute abnormalities.   Original Report Authenticated By: Lollie Marrow, M.D.     ROS: As per HPI otherwise negative.   Physical Exam: Filed Vitals:   01/26/12 2241 01/27/12 0130 01/27/12 0245 01/27/12 0348  BP: 175/82 155/89 129/62 140/71  Pulse: 90 78 70 72  Temp: 98.2 F (36.8 C)   97.5 F (36.4 C)  TempSrc: Oral   Oral  Resp: 20 18 15 17   Height:    6\' 4"  (1.93 m)  Weight:    110.8 kg (244 lb 4.3 oz)  SpO2: 98% 98% 100% 98%     General: Well developed, well nourished, in no acute distress. Head: Normocephalic, atraumatic, sclera non-icteric, pale, mucus membranes are moist Neck: Supple. JVD not elevated. Lungs: Clear bilaterally to auscultation without wheezes, rales, or rhonchi, though poor expansion. Breathing is unlabored. Heart: RRR with S1 S2. No murmurs, rubs, or gallops appreciated. Abdomen: Soft, obese non-tender, non-distended with normoactive bowel sounds. No rebound/guarding. No obvious abdominal masses. M-S:  Strength and tone appear normal for age. Lower extremities:without edema or ischemic changes, no open wounds  Neuro: Alert and oriented X 3. Moves all extremities spontaneously. Psych:  Responds to questions appropriately with flat affect. Dialysis Access:left lower AVF patent  Dialysis Orders: Center: [facility] EDW 106 - but rarely gets to because of signing off early.  EDW recently raised.  HD Bath 2K 2.5 Ca  Time 4 Heparin 3000. Access left lower AVF BFR 400 DFR  Zemplar  8 mcg IV/HD Epogen 4200   Units IV/HD  Venofer 100/ Wed Other NO variable NA - profile 2  Assessment/Plan: 1. Chest pain - resolved; unclear what precipitated it.  CXR.  Mild vasc congestion. Sats ok. Enzymes neg x 2, known CAD, no EKG changes 2.  ESRD -  MWF - [facility] - she is 4 kg above her EDW and rarely is this much above her EDW. Not sure she can tolerate this volume.removed at one time.  Will reweigh pre HD.  Otherwise, continue usual orders.   She chronically signs off early due to dialysis related headaches.  Last Friday ran about 70% of her treatment. 3.  Hypertension/volume  - Volume as per above; continue usual meds.  Not sure why she needs two different BB. BPN is 15,640. 4.  Anemia  - Dose Aranesp 6.25 x 1 5.  Metabolic bone disease -  Continue zemplar and binders 6.  Nutrition - renal diet 7. Diabetes -  per primary 8. Disp - d/c if no chest pain on HD  Sheffield Slider, PA-C [facility] Beeper [phone] 01/27/2012, 10:18 AM   I have seen and examined this patient and agree with plan per Bard Herbert.  Currently no chest pain.  Enzymes neg.  Will plan HD today.  Primary service plans to DC if she does well with HD.  Unclear why she is on both carvedilol and labetalol? Fadumo Heng T,MD 01/27/2012 11:29 AM

## 2012-01-27 NOTE — ED Notes (Signed)
EMD made aware. Order to recheck

## 2012-01-27 NOTE — Progress Notes (Signed)
[facility] PROGRESS NOTE  [name] ZOX:[id number] DOB: [birth year] DOA: 01/26/2012 PCP: [name]., MD  Assessment/Plan: Active Problems:  IDDM  Essential hypertension, benign  CHEST PAIN UNSPECIFIED  End stage renal disease on dialysis  Coronary artery disease  Hyperlipidemia  1. Chest pain - unclear etiology - patient with hx of CAD but currently without evidence for active ischemia . D/w with Dr. Daleen [name] and we will increase the Coreg dose.  2. ESRD - HD today    HPI/Subjective: No further chest pain   Objective: Filed Vitals:   01/26/12 2241 01/27/12 0130 01/27/12 0245 01/27/12 0348  BP: 175/82 155/89 129/62 140/71  Pulse: 90 78 70 72  Temp: 98.2 F (36.8 C)   97.5 F (36.4 C)  TempSrc: Oral   Oral  Resp: 20 18 15 17   Height:    6\' 4"  (1.93 m)  Weight:    110.8 kg (244 lb 4.3 oz)  SpO2: 98% 98% 100% 98%   No intake or output data in the 24 hours ending 01/27/12 0816 Filed Weights   01/27/12 0348  Weight: 110.8 kg (244 lb 4.3 oz)    Exam:   General:  axox3  Cardiovascular: RRR  Respiratory: CTAB  Abdomen: soft, NT   Data Reviewed: Basic Metabolic Panel:  Lab 01/27/12 8119 01/27/12 0245 01/26/12 2220  NA 140 -- 143  K 5.0 -- 4.2  CL 103 -- 102  CO2 22 -- 25  GLUCOSE 83 -- 171*  BUN 61* -- 60*  CREATININE 7.14* 7.15* 7.46*  CALCIUM 8.2* -- 9.0  MG -- -- --  PHOS -- -- --   Liver Function Tests: No results found for this basename: AST:5,ALT:5,ALKPHOS:5,BILITOT:5,PROT:5,ALBUMIN:5 in the last 168 hours No results found for this basename: LIPASE:5,AMYLASE:5 in the last 168 hours No results found for this basename: AMMONIA:5 in the last 168 hours CBC:  Lab 01/27/12 0435 01/27/12 0245 01/26/12 2220  WBC 12.5* 13.0* 12.8*  NEUTROABS -- -- --  HGB 10.0* 10.0* 10.7*  HCT 31.4* 31.5* 34.0*  MCV 98.1 97.5 97.7  PLT 183 170 219   Cardiac Enzymes:  Lab 01/27/12 0244  CKTOTAL --  CKMB --  CKMBINDEX --  TROPONINI <0.30   BNP (last  3 results)  Basename 01/27/12 0435 01/26/12 2220  PROBNP 15640.0* 16534.0*   CBG:  Lab 01/27/12 0631 01/27/12 0341 01/27/12 0055 01/27/12 0016 01/27/12 0009  GLUCAP 85 88 95 33* 39*    No results found for this or any previous visit (from the past 240 hour(s)).   Studies: Dg Chest 2 View  01/26/2012  *RADIOLOGY REPORT*  Clinical Data: Shortness of breath, chest pain, hypertension, diabetes  CHEST - 2 VIEW  Comparison: 05/02/2011  Findings: Enlargement of cardiac silhouette. Mediastinal contours and pulmonary vascularity normal. Chronic bronchitic and interstitial lung disease changes. No acute infiltrate, pleural effusion or pneumothorax. No acute osseous findings.  IMPRESSION: Enlargement of cardiac silhouette with pulmonary vascular congestion. Mild chronic bronchitic and interstitial lung disease changes question minimal chronic failure. No acute abnormalities.   Original Report Authenticated By: [name], M.D.     Scheduled Meds:   . amLODipine  10 mg Oral Daily  . aspirin  325 mg Oral Daily  . atorvastatin  80 mg Oral QPM  . calcium acetate  2,001 mg Oral TID WC  . carvedilol  12.5 mg Oral BID  . cloNIDine  0.1 mg Oral BID  . clopidogrel  75 mg Oral Daily  . dextrose  1  ampule Intravenous Once  . heparin  5,000 Units Subcutaneous Q8H  .  HYDROmorphone (DILAUDID) injection  1 mg Intravenous Once  . insulin aspart  0-15 Units Subcutaneous TID WC  . insulin aspart  0-5 Units Subcutaneous QHS  . insulin glargine  26 Units Subcutaneous QHS  . insulin glargine  28 Units Subcutaneous Daily  . isosorbide mononitrate  120 mg Oral Daily  . labetalol  200 mg Oral BID  . nitroGLYCERIN  0.6 mg Transdermal Daily  . ondansetron  4 mg Intravenous Once  . sodium chloride  3 mL Intravenous Q12H  . DISCONTD: sodium chloride   Intravenous Once   Continuous Infusions:   Active Problems:  IDDM  Essential hypertension, benign  CHEST PAIN UNSPECIFIED  End stage renal disease on  dialysis  Coronary artery disease  Hyperlipidemia     [name]  [facility] Pager [phone]. If 8PM-8AM, please contact night-coverage at [email or web address], password [facility] 01/27/2012, 8:16 AM  LOS: 1 day

## 2012-01-27 NOTE — Progress Notes (Signed)
1600 expressed desire to go home . Placed a call to DR. Lavera Guise  And spoken to him . Remained chest pain free . Medicated  With Tylenol  For headache . BPS 160 -180  . MD aware of all the above . Will evaluate pt . Pt and husband aware

## 2012-01-27 NOTE — ED Provider Notes (Signed)
History     CSN: [id number]  Arrival date & time 01/26/12  2200   First MD Initiated Contact with Patient 01/26/12 2300      Chief Complaint  Patient presents with  . Chest Pain    HPI The patient presents with chest pain, nausea, dyspnea.  Symptoms began yesterday, since onset has been worsening, are more incapacitating.  Pain is left-sided, with radiation down the left arm.  The pain is sore.  The patient denies exertional or pleuritic worsening.  She denies any fevers, chills, cough. She does note generalized sense of feeling unwell and fatigued. The patient's last dialysis session was 2 days ago.  Past Medical History  Diagnosis Date  . DM (diabetes mellitus)     Long-term insulin  . Hypertension   . Tobacco use disorder     Discontinued March 2012  . Hyperlipidemia     Hypertriglyceridemia 449 HDL 25  . Diabetic Charcot's joint disease   . Coronary artery disease     Status post cardiac catheterization June 2012 scattered coronary artery disease/atherosclerosis with 70-80% stenosis in a small right PDA.  . Gastroparesis   . Peripheral vascular disease     Tibial occlusive disease evaluated by Dr. Hart Rochester in August 2011. Medical therapy  . 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 CIN III (cervical intraepithelial neoplasia grade III) with severe dysplasia     S/P LEEP AND CONE  . Angina   . Chronic anemia     2nd to renal disease  . Iron deficiency anemia   . End stage renal disease on dialysis     On dialysis since March 2012  . End stage renal disease on dialysis 05/02/11    [facility]; M; W, F; last time 05/01/11  . Migraines     "just on dialysis days"  . Peripheral neuropathy     related to DM  . 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 CHF (congestive heart failure)   . Shortness of breath 05/02/11    "resting"  . History of abscesses in groins 12/06/2010    Past Surgical History  Procedure Date  . Av fistula placement 04/2010  . Dilation and curettage of uterus 2009   . Cervical biopsy  w/ loop electrode excision     h/o  . Cervical cone biopsy     h/o    Family History  Problem Relation Age of Onset  . Diabetes Mother   . Diabetes Father   . Diabetes Sister   . Breast cancer Maternal Aunt   . Cancer Maternal Aunt     breast    History  Substance Use Topics  . Smoking status: Former Smoker -- 0.3 packs/day for 10 years    Types: Cigarettes    Quit date: 08/05/2011  . Smokeless tobacco: Never Used   Comment: "quit smoking cigarettes 07/2010"  . Alcohol Use: No    OB History    Grav Para Term Preterm Abortions TAB SAB Ect Mult Living   2 1   1     1       Review of Systems  Constitutional:       HPI  HENT:       HPI otherwise negative  Eyes: Negative.   Respiratory:       HPI, otherwise negative  Cardiovascular:       HPI, otherwise nmegative  Gastrointestinal: Positive for nausea and vomiting. Negative for diarrhea.  Genitourinary:  HPI, otherwise negative  Musculoskeletal:       HPI, otherwise negative  Skin: Negative.   Neurological: Negative for syncope.    Allergies  Cephalexin and Sulfamethoxazole w-trimethoprim  Home Medications   Current Outpatient Rx  Name Route Sig Dispense Refill  . AMLODIPINE BESYLATE 10 MG PO TABS Oral Take 10 mg by mouth daily.    . ASPIRIN EC 81 MG PO TBEC Oral Take 1 tablet (81 mg total) by mouth daily.    . ATORVASTATIN CALCIUM 80 MG PO TABS Oral Take 80 mg by mouth every evening.    Marland Kitchen CALCIUM ACETATE 667 MG PO CAPS Oral Take 2,001 mg by mouth 3 (three) times daily with meals.    Marland Kitchen CARVEDILOL 12.5 MG PO TABS Oral Take 1 tablet (12.5 mg total) by mouth 2 (two) times daily. 60 tablet 6    Dose decreased 11/07/2011.  Marland Kitchen CLONIDINE HCL 0.1 MG PO TABS Oral Take 0.1 mg by mouth 2 (two) times daily.    Marland Kitchen CLOPIDOGREL BISULFATE 75 MG PO TABS Oral Take 75 mg by mouth daily.    . IBUPROFEN 200 MG PO TABS Oral Take 600 mg by mouth every 8 (eight) hours as needed. For pain.    . INSULIN  GLARGINE 100 UNIT/ML [place] SOLN Subcutaneous Inject 26-28 Units into the skin 2 (two) times daily. 28 units in the am and 26 units in the pm    . INSULIN LISPRO (HUMAN) 100 UNIT/ML [place] SOLN Subcutaneous Inject 6-8 Units into the skin 3 (three) times daily before meals. Per sliding scale    . ISOSORBIDE MONONITRATE ER 120 MG PO TB24 Oral Take 120 mg by mouth daily.    Marland Kitchen LABETALOL HCL 200 MG PO TABS Oral Take 200 mg by mouth 2 (two) times daily.      Marland Kitchen NITROGLYCERIN 0.4 MG SL SUBL Sublingual Place 0.4 mg under the tongue every 5 (five) minutes as needed. For chest pain      BP 175/82  Pulse 90  Temp 98.2 F (36.8 C) (Oral)  Resp 20  SpO2 98%  LMP 01/12/2012  Physical Exam  Nursing note and vitals reviewed. Constitutional: She is oriented to person, place, and time. She appears well-developed and well-nourished. No distress.  HENT:  Head: Normocephalic and atraumatic.  Eyes: Conjunctivae and EOM are normal.  Cardiovascular: Normal rate and regular rhythm.   Pulmonary/Chest: Effort normal and breath sounds normal. No stridor. No respiratory distress.  Abdominal: She exhibits no distension.  Musculoskeletal: She exhibits no edema.  Neurological: She is alert and oriented to person, place, and time. No cranial nerve deficit.  Skin: Skin is warm and dry.  Psychiatric: She has a normal mood and affect.    ED Course  Procedures (including critical care time)  Labs Reviewed  CBC - Abnormal; Notable for the following:    WBC 12.8 (*)     RBC 3.48 (*)     Hemoglobin 10.7 (*)     HCT 34.0 (*)     All other components within normal limits  BASIC METABOLIC PANEL - Abnormal; Notable for the following:    Glucose, Bld 171 (*)     BUN 60 (*)     Creatinine, Ser 7.46 (*)     GFR calc non Af Amer 6 (*)     GFR calc Af Amer 7 (*)     All other components within normal limits  PRO B NATRIURETIC PEPTIDE - Abnormal; Notable for the following:  Pro B Natriuretic peptide (BNP) 16534.0 (*)      All other components within normal limits  GLUCOSE, CAPILLARY - Abnormal; Notable for the following:    Glucose-Capillary 39 (*)     All other components within normal limits  GLUCOSE, CAPILLARY - Abnormal; Notable for the following:    Glucose-Capillary 33 (*)     All other components within normal limits  POCT I-STAT TROPONIN I  GLUCOSE, CAPILLARY   Dg Chest 2 View  01/26/2012  *RADIOLOGY REPORT*  Clinical Data: Shortness of breath, chest pain, hypertension, diabetes  CHEST - 2 VIEW  Comparison: 05/02/2011  Findings: Enlargement of cardiac silhouette. Mediastinal contours and pulmonary vascularity normal. Chronic bronchitic and interstitial lung disease changes. No acute infiltrate, pleural effusion or pneumothorax. No acute osseous findings.  IMPRESSION: Enlargement of cardiac silhouette with pulmonary vascular congestion. Mild chronic bronchitic and interstitial lung disease changes question minimal chronic failure. No acute abnormalities.   Original Report Authenticated By: Lollie Marrow, M.D.      No diagnosis found.  Soon after my initial evaluation the patient became particularly diaphoretic, lightheaded.  Bedside glucose check was treated hypoglycemia, with a value less than 40.  She received D50 and improved substantially, though she remained fatigued, mildly diaphoretic. Repeat check demonstrated a 90.  Cardiac 87 sinus rhythm normal Pulse ox 99% room air normal   Date: 01/27/2012  Rate: 96  Rhythm: normal sinus rhythm  QRS Axis: left  Intervals: QT prolonged  ST/T Wave abnormalities: nonspecific ST/T changes  Conduction Disutrbances:none  Narrative Interpretation:   Old EKG Reviewed: none available ABNORMAL - T wave changes from 09/2011   Prior records reviewed, including her catheterization results from 2 years ago demonstrating stenotic lesions up to 70% without interventions. MDM  This 36 YO female presents with new chest pain, left-sided discomfort and dyspnea.   Notably, the patient had an episode of hypoglycemia, decreased cognition and increased diaphoresis while being evaluated.  The patient and her husband clarified she took insulin as prior to arrival, and no subsequent food was eaten.  Given the patient's comorbidities, her prior catheterization showing a stenotic lesion, her BNP elevation and x-ray findings consistent with possible heart failure, which would be a new diagnosis, the patient will be admitted for further evaluation and management.  Brother is in mild leukocytosis, there is no overt opacification, and low suspicion for acute infectious pathology.  Gerhard Munch, MD 01/27/12 [phone]

## 2012-01-27 NOTE — H&P (Signed)
PCP:   Ignatius Specking., MD   Chief Complaint:  Chest pains  HPI: This is a 36 year old female with history of diabetes mellitus and end-stage renal disease who presents with complaints of chest pain. Patient states pain was decided radiate to the left arm. It occurred while she was at rest. She describes the pain as both dull and sharp and continuous. She says it was not relieved by nitroglycerin. She states feels have affected by movement. She reports shortness of breath the. She denies any diaphoresis, palpitation, wheezing, lower extremity edema, nausea or vomiting. The patient states she's had this chest pain before. She states her last stress test was 10/01/11, the findings are: There is question of very mild ischemia at the base of the anterior wall, read by Zackery Barefoot. On reviewing subsequent office notes dictated by PA Prescott Parma 10/02/11: "He (Dr Eden Emms) felt that this represented a low risk scan, and noted that she had only small vessel disease by catheterization in June 2012. He recommended treating medically and to not repeat a catheterization, unless she had recurrent problems during dialysis". She states her last heart cath was approximately 1 year ago  Review of Systems:  The patient denies anorexia, fever, weight loss,, vision loss, decreased hearing, hoarseness, syncope, dyspnea on exertion, peripheral edema, balance deficits, hemoptysis, abdominal pain, melena, hematochezia, severe indigestion/heartburn, hematuria, incontinence, genital sores, muscle weakness, suspicious skin lesions, transient blindness, difficulty walking, depression, unusual weight change, abnormal bleeding, enlarged lymph nodes, angioedema, and breast masses.  Past Medical History: Past Medical History  Diagnosis Date  . DM (diabetes mellitus)     Long-term insulin  . Hypertension   . Tobacco use disorder     Discontinued March 2012  . Hyperlipidemia     Hypertriglyceridemia 449 HDL 25  . Diabetic Charcot's  joint disease   . Coronary artery disease     Status post cardiac catheterization June 2012 scattered coronary artery disease/atherosclerosis with 70-80% stenosis in a small right PDA.  . Gastroparesis   . Peripheral vascular disease     Tibial occlusive disease evaluated by Dr. Hart Rochester in August 2011. Medical therapy  . 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 CIN III (cervical intraepithelial neoplasia grade III) with severe dysplasia     S/P LEEP AND [facility]  . Angina   . Chronic anemia     2nd to renal disease  . Iron deficiency anemia   . End stage renal disease on dialysis     On dialysis since March 2012  . End stage renal disease on dialysis 05/02/11    [facility]; M; W, F; last time 05/01/11  . Migraines     "just on dialysis days"  . Peripheral neuropathy     related to DM  . 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 CHF (congestive heart failure)   . Shortness of breath 05/02/11    "resting"  . History of abscesses in groins 12/06/2010   Past Surgical History  Procedure Date  . Av fistula placement 04/2010  . Dilation and curettage of uterus 2009  . Cervical biopsy  w/ loop electrode excision     h/o  . Cervical cone biopsy     h/o    Medications: Prior to Admission medications   Medication Sig Start Date End Date Taking? Authorizing Provider  amLODipine (NORVASC) 10 MG tablet Take 10 mg by mouth daily.   Yes Historical Provider, MD  aspirin EC 81 MG tablet Take 1 tablet (81 mg  total) by mouth daily. 11/07/11 11/06/12 Yes Prescott Parma, PA  atorvastatin (LIPITOR) 80 MG tablet Take 80 mg by mouth every evening.   Yes Historical Provider, MD  calcium acetate (PHOSLO) 667 MG capsule Take 2,001 mg by mouth 3 (three) times daily with meals.   Yes Historical Provider, MD  carvedilol (COREG) 12.5 MG tablet Take 1 tablet (12.5 mg total) by mouth 2 (two) times daily. 11/07/11  Yes Prescott Parma, PA  cloNIDine (CATAPRES) 0.1 MG tablet Take 0.1 mg by mouth 2 (two) times daily.   Yes Historical  Provider, MD  clopidogrel (PLAVIX) 75 MG tablet Take 75 mg by mouth daily.   Yes Historical Provider, MD  ibuprofen (ADVIL,MOTRIN) 200 MG tablet Take 600 mg by mouth every 8 (eight) hours as needed. For pain.   Yes Historical Provider, MD  insulin glargine (LANTUS) 100 UNIT/ML injection Inject 26-28 Units into the skin 2 (two) times daily. 28 units in the am and 26 units in the pm   Yes Historical Provider, MD  insulin lispro (HUMALOG) 100 UNIT/ML injection Inject 6-8 Units into the skin 3 (three) times daily before meals. Per sliding scale   Yes Historical Provider, MD  isosorbide mononitrate (IMDUR) 120 MG 24 hr tablet Take 120 mg by mouth daily.   Yes Historical Provider, MD  labetalol (NORMODYNE) 200 MG tablet Take 200 mg by mouth 2 (two) times daily.     Yes Historical Provider, MD  nitroGLYCERIN (NITROSTAT) 0.4 MG SL tablet Place 0.4 mg under the tongue every 5 (five) minutes as needed. For chest pain   Yes Historical Provider, MD    Allergies:   Allergies  Allergen Reactions  . Cephalexin     REACTION: ?dz at young age  . Sulfamethoxazole W-Trimethoprim     REACTION: thrush    Social History:  reports that she quit smoking about 5 months ago. Her smoking use included Cigarettes. She has a 3 pack-year smoking history. She has never used smokeless tobacco. She reports that she does not drink alcohol or use illicit drugs.  Family History: Family History  Problem Relation Age of Onset  . Diabetes Mother   . Diabetes Father   . Diabetes Sister   . Breast cancer Maternal Aunt   . Cancer Maternal Aunt     breast    Physical Exam: Filed Vitals:   01/26/12 2216 01/26/12 2241 01/27/12 0130  BP: 185/93 175/82 155/89  Pulse: 93 90 78  Temp: 99.9 F (37.7 C) 98.2 F (36.8 C)   TempSrc: Oral Oral   Resp: 18 20 18   SpO2: 96% 98% 98%    General:  Alert and oriented times three, well developed and nourished, no acute distress Eyes: PERRLA, pink conjunctiva, no scleral  icterus ENT: Moist oral mucosa, neck supple, no thyromegaly Lungs: clear to ascultation, no wheeze, no crackles, no use of accessory muscles Cardiovascular: regular rate and rhythm, no regurgitation, no gallops, no murmurs. No carotid bruits, no JVD Abdomen: soft, positive BS, non-tender, non-distended, no organomegaly, not an acute abdomen GU: not examined Neuro: CN II - XII grossly intact, sensation intact Musculoskeletal: strength 5/5 all extremities, no clubbing, cyanosis or edema mild  reproducible chest wall pain. Skin: no rash, no subcutaneous crepitation, no decubitus Psych: appropriate patient   Labs on Admission:   [facility] 01/26/12 2220  NA 143  K 4.2  CL 102  CO2 25  GLUCOSE 171*  BUN 60*  CREATININE 7.46*  CALCIUM 9.0  MG --  PHOS --  No results found for this basename: AST:2,ALT:2,ALKPHOS:2,BILITOT:2,PROT:2,ALBUMIN:2 in the last 72 hours No results found for this basename: LIPASE:2,AMYLASE:2 in the last 72 hours  Basename 01/26/12 2220  WBC 12.8*  NEUTROABS --  HGB 10.7*  HCT 34.0*  MCV 97.7  PLT 219   No results found for this basename: CKTOTAL:3,CKMB:3,CKMBINDEX:3,TROPONINI:3 in the last 72 hours No components found with this basename: POCBNP:3 No results found for this basename: DDIMER:2 in the last 72 hours No results found for this basename: HGBA1C:2 in the last 72 hours No results found for this basename: CHOL:2,HDL:2,LDLCALC:2,TRIG:2,CHOLHDL:2,LDLDIRECT:2 in the last 72 hours No results found for this basename: TSH,T4TOTAL,FREET3,T3FREE,THYROIDAB in the last 72 hours No results found for this basename: VITAMINB12:2,FOLATE:2,FERRITIN:2,TIBC:2,IRON:2,RETICCTPCT:2 in the last 72 hours  Micro Results: No results found for this or any previous visit (from the past 240 hour(s)).   Radiological Exams on Admission: Dg Chest 2 View  01/26/2012  *RADIOLOGY REPORT*  Clinical Data: Shortness of breath, chest pain, hypertension, diabetes  CHEST - 2 VIEW   Comparison: 05/02/2011  Findings: Enlargement of cardiac silhouette. Mediastinal contours and pulmonary vascularity normal. Chronic bronchitic and interstitial lung disease changes. No acute infiltrate, pleural effusion or pneumothorax. No acute osseous findings.  IMPRESSION: Enlargement of cardiac silhouette with pulmonary vascular congestion. Mild chronic bronchitic and interstitial lung disease changes question minimal chronic failure. No acute abnormalities.   Original Report Authenticated By: Lollie Marrow, M.D.    EKG: Normal sinus rhythm, no ST segment changes  Assessment/Plan Present on Admission:  .CHEST PAIN UNSPECIFIED Further observation on telemetry Cycle cardiac enzymes, aspirin 325 mg by mouth daily, lipid panel Nitro paste placed consult to [facility] cardiology in a.m.  2-D echo ordered  .End stage renal disease on dialysis Mild fluid overload, elevated BNP  Patient's due for dialysis in a.m.  Will consult nephrology  .Essential hypertension, benign .Coronary artery disease .Hyperlipidemia Stable resume home medications Defer to a.m. team and nephrology consult  .IDDM/hypoglycemia transient Stable Patient was hypoglycemic on initial presentation. However she take  insulin and not eaten.    full code DVT prophylaxis   Rohail Klees 01/27/2012, 2:46 AM

## 2012-01-27 NOTE — Discharge Summary (Signed)
Physician Discharge Summary  [name]:[id number] DOB: 09/30/1975 DOA: 01/26/2012  PCP: Ignatius Specking., MD  Admit date: 01/26/2012 Discharge date: 01/27/2012  Discharge Diagnoses:  CHEST PAIN UNSPECIFIED  IDDM Essential hypertension, benign End stage renal disease on dialysis Coronary artery disease Hyperlipidemia   Discharge Condition: good  Diet recommendation: carb modified   Filed Weights   01/27/12 0348 01/27/12 1240  Weight: 110.8 kg (244 lb 4.3 oz) 111.1 kg (244 lb 14.9 oz)    History of present illness:  This is a 36 year old female with history of diabetes mellitus and end-stage renal disease who presents with complaints of chest pain. Patient states pain was decided radiate to the left arm. It occurred while she was at rest. She describes the pain as both dull and sharp and continuous. She says it was not relieved by nitroglycerin. She states feels have affected by movement. She reports shortness of breath the. She denies any diaphoresis, palpitation, wheezing, lower extremity edema, nausea or vomiting. The patient states she's had this chest pain before. She states her last stress test was 10/01/11, the findings are: There is question of very mild ischemia at the base of the anterior wall, read by Zackery Barefoot. On reviewing subsequent office notes dictated by PA Prescott Parma 10/02/11: "He (Dr Eden Emms) felt that this represented a low risk scan, and noted that she had only small vessel disease by catheterization in June 2012. He recommended treating medically and to not repeat a catheterization, unless she had recurrent problems during dialysis". She states her last heart cath was approximately 1 year ago   Hospital Course:  1. Chest pain - unclear etiology - patient with hx of CAD but currently without evidence for active ischemia . D/w with Dr. Daleen Squibb and we will increase the Coreg dose. The patient remained without chest pain in the hospital and she was discharged home with  plans to continue to followup with [facility] cardiology 2. end-stage renal disease-patient received hemodialysis prior to being discharged home   Procedures:  Hemodialysis  Consultations:  [facility]  Discharge Exam: Filed Vitals:   01/27/12 1545  BP: 180/85  Pulse: 75  Temp:   Resp: 20   Filed Vitals:   01/27/12 1400 01/27/12 1430 01/27/12 1446 01/27/12 1545  BP: 166/96 153/60 169/73 180/85  Pulse: 81 86 79 75  Temp:   97.4 F (36.3 C)   TempSrc:   Oral   Resp: 20 20 18 20   Height:      Weight:      SpO2:        General: Alert and oriented x3 Cardiovascular: Regular rate and rhythm Respiratory: Clear to auscultation bilaterally  Discharge Instructions  Discharge Orders    Future Appointments: Provider: Department: Dept Phone: Center:   02/04/2012 2:30 PM Trellis Paganini, MD [facility] [phone] [facility]     Future Orders Please Complete By Expires   Diet renal [id number]      Increase activity slowly        Medication List  As of 01/27/2012  4:21 PM   TAKE these medications         amLODipine 10 MG tablet   Commonly known as: NORVASC   Take 10 mg by mouth daily.      aspirin EC 81 MG tablet   Take 1 tablet (81 mg total) by mouth daily.      atorvastatin 80 MG tablet   Commonly known as: LIPITOR   Take 80 mg by  mouth every evening.      calcium acetate 667 MG capsule   Commonly known as: PHOSLO   Take 2,001 mg by mouth 3 (three) times daily with meals.      carvedilol 12.5 MG tablet   Commonly known as: COREG   Take 1.5 tablets (18.75 mg total) by mouth 2 (two) times daily.      cloNIDine 0.1 MG tablet   Commonly known as: CATAPRES   Take 0.1 mg by mouth 2 (two) times daily.      clopidogrel 75 MG tablet   Commonly known as: PLAVIX   Take 75 mg by mouth daily.      ibuprofen 200 MG tablet   Commonly known as: ADVIL,MOTRIN   Take 600 mg by mouth every 8 (eight) hours as needed. For pain.      insulin glargine  100 UNIT/ML injection   Commonly known as: LANTUS   Inject 26-28 Units into the skin 2 (two) times daily. 28 units in the am and 26 units in the pm      insulin lispro 100 UNIT/ML injection   Commonly known as: HUMALOG   Inject 6-8 Units into the skin 3 (three) times daily before meals. Per sliding scale      isosorbide mononitrate 120 MG 24 hr tablet   Commonly known as: IMDUR   Take 120 mg by mouth daily.      labetalol 200 MG tablet   Commonly known as: NORMODYNE   Take 200 mg by mouth 2 (two) times daily.      nitroGLYCERIN 0.4 MG SL tablet   Commonly known as: NITROSTAT   Place 0.4 mg under the tongue every 5 (five) minutes as needed. For chest pain           Follow-up Information    Follow up with VYAS,DHRUV B., MD.   Contact information:   [address] [place] [address] [phone]           The results of significant diagnostics from this hospitalization (including imaging, microbiology, ancillary and laboratory) are listed below for reference.    Significant Diagnostic Studies: Dg Chest 2 View  01/26/2012  *RADIOLOGY REPORT*  Clinical Data: Shortness of breath, chest pain, hypertension, diabetes  CHEST - 2 VIEW  Comparison: 05/02/2011  Findings: Enlargement of cardiac silhouette. Mediastinal contours and pulmonary vascularity normal. Chronic bronchitic and interstitial lung disease changes. No acute infiltrate, pleural effusion or pneumothorax. No acute osseous findings.  IMPRESSION: Enlargement of cardiac silhouette with pulmonary vascular congestion. Mild chronic bronchitic and interstitial lung disease changes question minimal chronic failure. No acute abnormalities.   Original Report Authenticated By: Lollie Marrow, M.D.     Microbiology: No results found for this or any previous visit (from the past 240 hour(s)).   Labs: Basic Metabolic Panel:  Lab 01/27/12 5621 01/27/12 0245 01/26/12 2220  NA 140 -- 143  K 5.0 -- 4.2  CL 103 -- 102    CO2 22 -- 25  GLUCOSE 83 -- 171*  BUN 61* -- 60*  CREATININE 7.14* 7.15* 7.46*  CALCIUM 8.2* -- 9.0  MG -- -- --  PHOS -- -- --   Liver Function Tests: No results found for this basename: AST:5,ALT:5,ALKPHOS:5,BILITOT:5,PROT:5,ALBUMIN:5 in the last 168 hours No results found for this basename: LIPASE:5,AMYLASE:5 in the last 168 hours No results found for this basename: AMMONIA:5 in the last 168 hours CBC:  Lab 01/27/12 0435 01/27/12 0245 01/26/12 2220  WBC 12.5* 13.0* 12.8*  NEUTROABS -- -- --  HGB 10.0* 10.0* 10.7*  HCT 31.4* 31.5* 34.0*  MCV 98.1 97.5 97.7  PLT 183 170 219   Cardiac Enzymes:  Lab 01/27/12 0804 01/27/12 0244  CKTOTAL -- --  CKMB -- --  CKMBINDEX -- --  TROPONINI <0.30 <0.30   BNP: BNP (last 3 results)  Basename 01/27/12 0435 01/26/12 2220  PROBNP 15640.0* 16534.0*   CBG:  Lab 01/27/12 1540 01/27/12 1100 01/27/12 0631 01/27/12 0341 01/27/12 0055  GLUCAP 95 115* 85 88 95    Time coordinating discharge: 20 minutes  Signed:  Iva Posten  [facility] 01/27/2012, 4:21 PM

## 2012-01-27 NOTE — Progress Notes (Signed)
1530 transferred in from hemodialysis  Via v\bed with nurses

## 2012-01-27 NOTE — ED Notes (Signed)
Pt hard to arouse. Can follow commands. Vital signs stable. EMD made aware. Will continue to monitor.

## 2012-01-27 NOTE — Progress Notes (Signed)
Discharge instruction and prescrition given to pt . Spouse in attendance . Both verbalized understanding. Wheeled to lobby by NT

## 2012-01-27 NOTE — Progress Notes (Signed)
1510 report received from Hemodialysis rn

## 2012-01-29 ENCOUNTER — Telehealth: Payer: Self-pay | Admitting: Cardiology

## 2012-01-29 NOTE — Telephone Encounter (Signed)
Patient is on Labetalol and Coreg.  Kidney doctor (Dr. Kerby Nora) does not think patient should be on both medications.  Please clarify.  Patient is aware Dr. Andee Lineman is no longer with practice.

## 2012-01-30 NOTE — Telephone Encounter (Signed)
This needs to be evaluated at time of OV, so as to clarify as to which provider added the Coreg and the Labetalol.

## 2012-02-02 ENCOUNTER — Other Ambulatory Visit: Payer: Self-pay | Admitting: Cardiovascular Disease

## 2012-02-03 NOTE — Telephone Encounter (Signed)
Patient thinks Coreg was given by [place] & Labetalol by Camille Bal (kidney doctor).  Patient not real sure since kidney MD could have just give her a refill on that medication.  Thinks she has been on both of these x over a year.  Patient initially had OV scheduled for 9/12 with Tereso Newcomer in our [facility] office that was made by her kidney doctor.  Patient stated that she cancelled that OV because she did not feel that she needed it just to discuss medicine below.

## 2012-02-04 ENCOUNTER — Encounter: Payer: Self-pay | Admitting: Obstetrics and Gynecology

## 2012-02-04 ENCOUNTER — Ambulatory Visit (INDEPENDENT_AMBULATORY_CARE_PROVIDER_SITE_OTHER): Payer: 59 | Admitting: Obstetrics and Gynecology

## 2012-02-04 VITALS — BP 140/86 | Ht 76.0 in | Wt 248.0 lb

## 2012-02-04 DIAGNOSIS — Z01419 Encounter for gynecological examination (general) (routine) without abnormal findings: Secondary | ICD-10-CM

## 2012-02-04 NOTE — Progress Notes (Signed)
A patient came to see me today for her annual GYN exam. Her menstrual cycles are regular. She uses condoms for birth control. She is having no abnormal bleeding. She is having no pelvic pain. 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 She has had normal yearly Pap smears since then. Her last Pap smear was 2012. She is an insulin-dependent diabetic now requiring dialysis for nephrotic syndrome.  Physical examination:[name] present. HEENT within normal limits. Neck: Thyroid not large. No masses. Supraclavicular nodes: not enlarged. Breasts: Examined in both sitting and lying  position. No skin changes and no masses. Abdomen: Soft no guarding rebound or masses or hernia. Pelvic: External: Within normal limits. BUS: Within normal limits. Vaginal:within normal limits. Good estrogen effect. No evidence of cystocele rectocele or enterocele. Cervix: clean. Uterus: Normal size and shape. Adnexa: No masses. Rectovaginal exam: Confirmatory and negative. Extremities: Within normal limits.  Assessment: CIN-3. Diabetes mellitus. Nephrotic syndrome.  Plan:The new Pap smear guidelines were discussed with the patient. No pap done.

## 2012-02-05 ENCOUNTER — Telehealth: Payer: Self-pay | Admitting: Cardiology

## 2012-02-05 NOTE — Telephone Encounter (Signed)
Patient said her kidney doctor wants to know why she is on both coreg and labetolol.

## 2012-02-06 ENCOUNTER — Other Ambulatory Visit: Payer: Self-pay | Admitting: Cardiology

## 2012-02-06 ENCOUNTER — Ambulatory Visit: Payer: 59 | Admitting: Physician Assistant

## 2012-02-06 MED ORDER — ATORVASTATIN CALCIUM 80 MG PO TABS: 80.0000 mg | ORAL_TABLET | Freq: Every evening | ORAL | Status: DC

## 2012-02-06 NOTE — Telephone Encounter (Signed)
I reviewed the hospitalization and med history.  Since I have not seen her in the office it is hard to comment on why both beta blocker.  She was discharged from the hospital on this by Dr. Lavera Guise.  I think it is reasonable to ask her to come to a clinic appt so that we can decide on on or the other medication.

## 2012-02-10 NOTE — Telephone Encounter (Signed)
Left message to return call 

## 2012-02-11 NOTE — Telephone Encounter (Signed)
Patient notified of below.  She declines appointment at this time.  States she is almost due for her follow up anyway & will try to make contact with Dr. Andee Lineman.

## 2012-04-30 ENCOUNTER — Ambulatory Visit (INDEPENDENT_AMBULATORY_CARE_PROVIDER_SITE_OTHER): Payer: 59 | Admitting: Obstetrics and Gynecology

## 2012-04-30 ENCOUNTER — Other Ambulatory Visit (HOSPITAL_COMMUNITY)
Admission: RE | Admit: 2012-04-30 | Discharge: 2012-04-30 | Disposition: A | Discharge status: Home / Self Care | Payer: 59 | Source: Ambulatory Visit | Attending: Obstetrics and Gynecology | Admitting: Obstetrics and Gynecology

## 2012-04-30 DIAGNOSIS — N186 End stage renal disease: Secondary | ICD-10-CM

## 2012-04-30 DIAGNOSIS — Z01419 Encounter for gynecological examination (general) (routine) without abnormal findings: Secondary | ICD-10-CM | POA: Insufficient documentation

## 2012-04-30 NOTE — Addendum Note (Signed)
Addended by: Dayna Barker on: 04/30/2012 02:48 PM   Modules accepted: Orders

## 2012-04-30 NOTE — Patient Instructions (Signed)
We will call you when Pap smear results are ready

## 2012-04-30 NOTE — Progress Notes (Signed)
Patient came back today at the request of the transplant service at [facility]. She is on the list for renal transplant. When we saw her in September for annual exam a Pap smear was not done due to new guidelines. She had been treated with a LEEP cervical dysplasia in 2000 but has had 12-15 normal Pap smears since then. Their  requirements however are that  A  patient needs a Pap smear yearly.  Pelvic exam: External within normal limits. BUS within normal limits. Vaginal exam within normal limits. Cervix is clean without lesions. Uterus is normal size and shape. Adnexa failed to reveal masses. Rectovaginal examination is confirmatory and without masses.   Assessment: Normal GYN exam  Plan: Pap done.

## 2012-05-07 ENCOUNTER — Other Ambulatory Visit: Payer: Self-pay | Admitting: Dermatology

## 2012-05-15 ENCOUNTER — Encounter: Payer: Self-pay | Admitting: Cardiology

## 2012-05-15 ENCOUNTER — Ambulatory Visit (INDEPENDENT_AMBULATORY_CARE_PROVIDER_SITE_OTHER): Payer: 59 | Admitting: Cardiology

## 2012-05-15 VITALS — HR 71 | Ht 76.0 in | Wt 233.0 lb

## 2012-05-15 DIAGNOSIS — I10 Essential (primary) hypertension: Secondary | ICD-10-CM

## 2012-05-15 MED ORDER — LABETALOL HCL 300 MG PO TABS: 300.0000 mg | ORAL_TABLET | Freq: Three times a day (TID) | ORAL | Status: DC

## 2012-05-15 NOTE — Patient Instructions (Addendum)
Stop carvedilol Start Labetalol 300 mg three times a day Continue all other medications as listed  Follow up in 1 month.

## 2012-05-15 NOTE — Progress Notes (Signed)
HPI The patient presents as a new patient for me having previously seen Dr. Andee Lineman.  She is now living in [place].  She has a history of small vessel coronary disease managed medically. She did have chest pain earlier this year and was admitted with a slight troponin bump. She left the hospital AMA. She did have an outpatient stress perfusion study in May which demonstrated possible old inferior scar a well preserved ejection fraction and was thought to be low risk. I reviewed records from hospitalization in September. She had atypical chest pain at that time. She has end-stage renal disease dialysis dependent. She reports that her blood pressure initially was low and she would hold her medications before dialysis. However, it has now been elevated on repeat readings particularly during dialysis. He is 150-160 systolic at home but can be 200 in dialysis. I did get a question from her nephrologist about why she was on 2 beta blockers. The patient is limited in her activities by neuropathy. However, she denies any chest pressure, neck or arm discomfort. She's not having any palpitations, presyncope or syncope. She denies any PND or orthopnea.  Allergies  Allergen Reactions  . Cephalexin     REACTION: ?dz at young age  . Sulfamethoxazole W-Trimethoprim     REACTION: thrush    Current Outpatient Prescriptions  Medication Sig Dispense Refill  . amLODipine (NORVASC) 10 MG tablet Take 10 mg by mouth daily.      Marland Kitchen aspirin EC 81 MG tablet Take 1 tablet (81 mg total) by mouth daily.      Marland Kitchen atorvastatin (LIPITOR) 80 MG tablet Take 1 tablet (80 mg total) by mouth every evening.  30 tablet  3  . calcium acetate (PHOSLO) 667 MG capsule Take 2,001 mg by mouth 3 (three) times daily with meals.      . carvedilol (COREG) 12.5 MG tablet Take 1.5 tablets (18.75 mg total) by mouth 2 (two) times daily.  90 tablet  6  . cloNIDine (CATAPRES) 0.1 MG tablet Take 0.1 mg by mouth 2 (two) times daily.      .  clopidogrel (PLAVIX) 75 MG tablet Take 75 mg by mouth daily.      Marland Kitchen ibuprofen (ADVIL,MOTRIN) 200 MG tablet Take 600 mg by mouth every 8 (eight) hours as needed. For pain.      Marland Kitchen insulin glargine (LANTUS) 100 UNIT/ML injection Inject 26-28 Units into the skin 2 (two) times daily. 28 units in the am and 26 units in the pm      . insulin lispro (HUMALOG) 100 UNIT/ML injection Inject 6-8 Units into the skin 3 (three) times daily before meals. Per sliding scale      . isosorbide mononitrate (IMDUR) 120 MG 24 hr tablet Take 120 mg by mouth daily.      Marland Kitchen labetalol (NORMODYNE) 200 MG tablet Take 200 mg by mouth 2 (two) times daily.        . nitroGLYCERIN (NITROSTAT) 0.4 MG SL tablet Place 0.4 mg under the tongue every 5 (five) minutes as needed. For chest pain        Past Medical History  Diagnosis Date  . DM (diabetes mellitus)     Long-term insulin  . Hypertension   . Tobacco use disorder     Discontinued March 2012  . Hyperlipidemia     Hypertriglyceridemia 449 HDL 25  . Diabetic Charcot's joint disease   . Coronary artery disease     Status post cardiac catheterization June 2012  scattered coronary artery disease/atherosclerosis with 70-80% stenosis in a small right PDA.  . Gastroparesis   . Peripheral vascular disease     Tibial occlusive disease evaluated by Dr. Hart Rochester in August 2011. Medical therapy  . 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 CIN III (cervical intraepithelial neoplasia grade III) with severe dysplasia     S/P LEEP AND [facility]  . Angina   . Chronic anemia     2nd to renal disease  . Iron deficiency anemia   . End stage renal disease on dialysis     On dialysis since March 2012  . End stage renal disease on dialysis 05/02/11    [facility]; M; W, F; last time 05/01/11  . Migraines     "just on dialysis days"  . Peripheral neuropathy     related to DM  . 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 CHF (congestive heart failure)   . Shortness of breath 05/02/11    "resting"  . History  of abscesses in groins 12/06/2010    Past Surgical History  Procedure Date  . Av fistula placement 04/2010  . Dilation and curettage of uterus 2009  . Cervical biopsy  w/ loop electrode excision     h/o  . Cervical cone biopsy     h/o  . Colposcopy     ROS:  As stated in the HPI and negative for all other systems.  PHYSICAL EXAM LMP 04/04/2012 GENERAL:  Well appearing HEENT:  Pupils equal round and reactive, fundi not visualized, oral mucosa unremarkable NECK:  No jugular venous distention, waveform within normal limits, carotid upstroke brisk and symmetric, no bruits, no thyromegaly LYMPHATICS:  No cervical, inguinal adenopathy LUNGS:  Clear to auscultation bilaterally BACK:  No CVA tenderness CHEST:  Unremarkable HEART:  PMI not displaced or sustained,S1 and S2 within normal limits, no S3, no S4, no clicks, no rubs, no murmurs ABD:  Flat, positive bowel sounds normal in frequency in pitch, no bruits, no rebound, no guarding, no midline pulsatile mass, no hepatomegaly, no splenomegaly EXT:  2 plus pulses throughout, no edema, no cyanosis no clubbing SKIN:  No rashes no nodules NEURO:  Cranial nerves II through XII grossly intact, motor grossly intact throughout PSYCH:  Cognitively intact, oriented to person place and time  ASSESSMENT AND PLAN  Coronary artery disease  The patient has no new sypmtoms.  No further cardiovascular testing is indicated.  We will continue with aggressive risk reduction and meds as listed.  Essential hypertension, benign  I'm going to consolidate her beta blockers. I'm going to discontinue the carvedilol but increase the labetalol to 300 mg 3 times a day. This dose may certainly need titration. I may be able to consolidate to twice a day going forward. She will let him know her blood pressures.  Hyperlipidemia  I will defer to Dr. Sherril Croon with a goal LDL less than 70.  IDDM  Followed by Dr. Sherril Croon

## 2012-05-31 ENCOUNTER — Other Ambulatory Visit: Payer: Self-pay | Admitting: Cardiology

## 2012-06-01 NOTE — Telephone Encounter (Signed)
Looks like patient is following at [facility] now - has OV with Tereso Newcomer on 1/16

## 2012-06-09 ENCOUNTER — Ambulatory Visit: Payer: 59 | Admitting: Physician Assistant

## 2012-06-11 ENCOUNTER — Ambulatory Visit: Payer: 59 | Admitting: Physician Assistant

## 2012-07-23 DIAGNOSIS — R51 Headache: Secondary | ICD-10-CM | POA: Diagnosis not present

## 2012-07-23 DIAGNOSIS — Z049 Encounter for examination and observation for unspecified reason: Secondary | ICD-10-CM | POA: Diagnosis not present

## 2012-07-28 DIAGNOSIS — B351 Tinea unguium: Secondary | ICD-10-CM | POA: Diagnosis not present

## 2012-07-30 ENCOUNTER — Other Ambulatory Visit: Payer: Self-pay | Admitting: Cardiology

## 2012-07-30 DIAGNOSIS — G43719 Chronic migraine without aura, intractable, without status migrainosus: Secondary | ICD-10-CM | POA: Diagnosis not present

## 2012-08-02 ENCOUNTER — Emergency Department (HOSPITAL_COMMUNITY): Payer: 59

## 2012-08-02 ENCOUNTER — Encounter (HOSPITAL_COMMUNITY): Payer: Self-pay | Admitting: *Deleted

## 2012-08-02 DIAGNOSIS — Z87891 Personal history of nicotine dependence: Secondary | ICD-10-CM | POA: Insufficient documentation

## 2012-08-02 DIAGNOSIS — Z794 Long term (current) use of insulin: Secondary | ICD-10-CM | POA: Insufficient documentation

## 2012-08-02 DIAGNOSIS — Z7982 Long term (current) use of aspirin: Secondary | ICD-10-CM | POA: Insufficient documentation

## 2012-08-02 DIAGNOSIS — W010XXA Fall on same level from slipping, tripping and stumbling without subsequent striking against object, initial encounter: Secondary | ICD-10-CM | POA: Insufficient documentation

## 2012-08-02 DIAGNOSIS — S63509A Unspecified sprain of unspecified wrist, initial encounter: Secondary | ICD-10-CM | POA: Insufficient documentation

## 2012-08-02 DIAGNOSIS — Z8739 Personal history of other diseases of the musculoskeletal system and connective tissue: Secondary | ICD-10-CM | POA: Insufficient documentation

## 2012-08-02 DIAGNOSIS — Z992 Dependence on renal dialysis: Secondary | ICD-10-CM | POA: Insufficient documentation

## 2012-08-02 DIAGNOSIS — S52123A Displaced fracture of head of unspecified radius, initial encounter for closed fracture: Secondary | ICD-10-CM | POA: Insufficient documentation

## 2012-08-02 DIAGNOSIS — Y929 Unspecified place or not applicable: Secondary | ICD-10-CM | POA: Insufficient documentation

## 2012-08-02 DIAGNOSIS — N186 End stage renal disease: Secondary | ICD-10-CM | POA: Insufficient documentation

## 2012-08-02 DIAGNOSIS — Z8679 Personal history of other diseases of the circulatory system: Secondary | ICD-10-CM | POA: Insufficient documentation

## 2012-08-02 DIAGNOSIS — Z862 Personal history of diseases of the blood and blood-forming organs and certain disorders involving the immune mechanism: Secondary | ICD-10-CM | POA: Insufficient documentation

## 2012-08-02 DIAGNOSIS — Z79899 Other long term (current) drug therapy: Secondary | ICD-10-CM | POA: Insufficient documentation

## 2012-08-02 DIAGNOSIS — E1149 Type 2 diabetes mellitus with other diabetic neurological complication: Secondary | ICD-10-CM | POA: Insufficient documentation

## 2012-08-02 DIAGNOSIS — Z7902 Long term (current) use of antithrombotics/antiplatelets: Secondary | ICD-10-CM | POA: Insufficient documentation

## 2012-08-02 DIAGNOSIS — G909 Disorder of the autonomic nervous system, unspecified: Secondary | ICD-10-CM | POA: Insufficient documentation

## 2012-08-02 DIAGNOSIS — I120 Hypertensive chronic kidney disease with stage 5 chronic kidney disease or end stage renal disease: Secondary | ICD-10-CM | POA: Insufficient documentation

## 2012-08-02 DIAGNOSIS — Z9861 Coronary angioplasty status: Secondary | ICD-10-CM | POA: Insufficient documentation

## 2012-08-02 DIAGNOSIS — Y939 Activity, unspecified: Secondary | ICD-10-CM | POA: Insufficient documentation

## 2012-08-02 DIAGNOSIS — I2510 Atherosclerotic heart disease of native coronary artery without angina pectoris: Secondary | ICD-10-CM | POA: Insufficient documentation

## 2012-08-02 NOTE — ED Notes (Signed)
Pt states that she fell on her left arm, pt wrist, elbow and shoulder is hurting. Pt has a fistula in the arm near wrist. Pt able to wiggle fingers, CNS intact.

## 2012-08-03 ENCOUNTER — Other Ambulatory Visit: Payer: Self-pay | Admitting: Cardiology

## 2012-08-03 ENCOUNTER — Emergency Department (HOSPITAL_COMMUNITY)
Admission: EM | Admit: 2012-08-03 | Discharge: 2012-08-03 | Disposition: A | Discharge status: Home / Self Care | Payer: 59 | Attending: Emergency Medicine | Admitting: Emergency Medicine

## 2012-08-03 DIAGNOSIS — M25512 Pain in left shoulder: Secondary | ICD-10-CM

## 2012-08-03 DIAGNOSIS — S52122A Displaced fracture of head of left radius, initial encounter for closed fracture: Secondary | ICD-10-CM

## 2012-08-03 NOTE — ED Provider Notes (Signed)
Medical screening examination/treatment/procedure(s) were performed by non-physician practitioner and as supervising physician I was immediately available for consultation/collaboration.  Doug Sou, MD 08/03/12 [phone]

## 2012-08-03 NOTE — ED Notes (Signed)
Ortho paged for sling immobilizer  

## 2012-08-03 NOTE — ED Provider Notes (Signed)
History     CSN: [id number]  Arrival date & time 08/02/12  2202   First MD Initiated Contact with Patient 08/03/12 0133      Chief Complaint  Patient presents with  . Wrist Injury    (Consider location/radiation/quality/duration/timing/severity/associated sxs/prior treatment) HPI Comments: Patient slipped and fell catching herself with outstretched arms now with pain L arm at wrist, elbow and shoulder Patient concerned because she has a dialysis  graft in L forearm  Patient is a 37 y.o. female presenting with wrist injury. The history is provided by the patient.  Wrist Injury Location:  Arm Arm location:  L arm Pain details:    Quality:  Aching   Severity:  Moderate   Onset quality:  Sudden   Timing:  Constant Chronicity:  New Handedness:  Right-handed Dislocation: no   Foreign body present:  No foreign bodies Associated symptoms: no back pain     Past Medical History  Diagnosis Date  . DM (diabetes mellitus)     Long-term insulin  . Hypertension   . Tobacco use disorder     Discontinued March 2012  . Hyperlipidemia     Hypertriglyceridemia 449 HDL 25  . Diabetic Charcot's joint disease   . Coronary artery disease     Status post cardiac catheterization June 2012 scattered coronary artery disease/atherosclerosis with 70-80% stenosis in a small right PDA.  . Gastroparesis   . Peripheral vascular disease     Tibial occlusive disease evaluated by Dr. Hart Rochester in August 2011. Medical therapy  . 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 CIN III (cervical intraepithelial neoplasia grade III) with severe dysplasia     S/P LEEP AND CONE  . Chronic anemia     2nd to renal disease  . End stage renal disease on dialysis 05/02/11    [facility]; M; W, F; last time 05/01/11  . Migraines     "just on dialysis days"  . Peripheral neuropathy     related to DM  . History of abscesses in groins 12/06/2010    Past Surgical History  Procedure Laterality Date  . Av fistula placement  04/2010  .  Dilation and curettage of uterus  2009  . Cervical biopsy  w/ loop electrode excision      h/o  . Cervical cone biopsy      h/o  . Colposcopy      Family History  Problem Relation Age of Onset  . Diabetes Mother   . Diabetes Father   . Diabetes Sister   . Breast cancer Maternal Aunt     Age 73's    History  Substance Use Topics  . Smoking status: Former Smoker -- 0.30 packs/day for 10 years    Types: Cigarettes    Quit date: 08/05/2011  . Smokeless tobacco: Never Used     Comment: "quit smoking cigarettes 07/2010"  . Alcohol Use: No    OB History   Grav Para Term Preterm Abortions TAB SAB Ect Mult Living   2 1 1  1     1       Review of Systems  Musculoskeletal: Positive for joint swelling. Negative for back pain.  All other systems reviewed and are negative.    Allergies  Cephalexin and Sulfamethoxazole w-trimethoprim  Home Medications   Current Outpatient Rx  Name  Route  Sig  Dispense  Refill  . amLODipine (NORVASC) 10 MG tablet   Oral   Take 10 mg by mouth daily.         Marland Kitchen  aspirin EC 81 MG tablet   Oral   Take 1 tablet (81 mg total) by mouth daily.         Marland Kitchen atorvastatin (LIPITOR) 80 MG tablet   Oral   Take 1 tablet (80 mg total) by mouth every evening.   30 tablet   3   . calcium acetate (PHOSLO) 667 MG capsule   Oral   Take 2,001 mg by mouth 3 (three) times daily with meals.         . cloNIDine (CATAPRES) 0.1 MG tablet   Oral   Take 0.1 mg by mouth 2 (two) times daily.         . clopidogrel (PLAVIX) 75 MG tablet   Oral   Take 75 mg by mouth daily.         . clopidogrel (PLAVIX) 75 MG tablet      TAKE 1 TABLET EVERY DAY   30 tablet   6   . ibuprofen (ADVIL,MOTRIN) 200 MG tablet   Oral   Take 600 mg by mouth every 8 (eight) hours as needed. For pain.         Marland Kitchen insulin glargine (LANTUS) 100 UNIT/ML injection   Subcutaneous   Inject 26-28 Units into the skin 2 (two) times daily. 28 units in the am and 26 units in the pm          . insulin lispro (HUMALOG) 100 UNIT/ML injection   Subcutaneous   Inject 6-8 Units into the skin 3 (three) times daily before meals. Per sliding scale         . isosorbide mononitrate (IMDUR) 120 MG 24 hr tablet   Oral   Take 120 mg by mouth daily.         Marland Kitchen labetalol (NORMODYNE) 300 MG tablet   Oral   Take 1 tablet (300 mg total) by mouth 3 (three) times daily.   270 tablet   3   . nitroGLYCERIN (NITROSTAT) 0.4 MG SL tablet   Sublingual   Place 0.4 mg under the tongue every 5 (five) minutes as needed. For chest pain           BP 173/85  Temp(Src) 98.3 F (36.8 C) (Oral)  Resp 18  SpO2 97%  LMP 07/19/2012  Physical Exam  Constitutional: She is oriented to person, place, and time. She appears well-developed and well-nourished.  HENT:  Head: Normocephalic.  Eyes: Pupils are equal, round, and reactive to light.  Cardiovascular: Normal rate.   Musculoskeletal: She exhibits tenderness. She exhibits no edema.       Left shoulder: She exhibits tenderness. She exhibits normal range of motion.       Left elbow: She exhibits decreased range of motion and swelling. She exhibits no deformity. Tenderness found. Radial head and medial epicondyle tenderness noted.       Left wrist: She exhibits tenderness. She exhibits normal range of motion, no bony tenderness and no swelling.  Neurological: She is alert and oriented to person, place, and time.  Skin: Skin is warm.    ED Course  Procedures (including critical care time)  Labs Reviewed - No data to display Dg Elbow Complete Left  08/02/2012  *RADIOLOGY REPORT*  Clinical Data: Pain and limited range of motion and left elbow post fall  LEFT ELBOW - COMPLETE 3+ VIEW  Comparison: None  Findings: Osseous mineralization normal. Joint spaces preserved. Suspicion of subtle nondisplaced radial neck fracture. No additional fracture, dislocation or bone destruction. No definite elbow  joint effusion seen.  IMPRESSION: Question  subtle nondisplaced radial neck fracture.   Original Report Authenticated By: Ulyses Southward, M.D.    Dg Wrist Complete Left  08/02/2012  *RADIOLOGY REPORT*  Clinical Data:  Pain and limited range of motion left wrist post fall  LEFT WRIST - COMPLETE 3+ VIEW  Comparison: None  Findings: Osseous mineralization grossly normal. Joint spaces preserved. No acute fracture, dislocation or bone destruction. Scattered atherosclerotic calcifications. Upper normal size of scapholunate interval.  IMPRESSION: No definite acute bony abnormalities.   Original Report Authenticated By: Ulyses Southward, M.D.    Dg Shoulder Left  08/02/2012  *RADIOLOGY REPORT*  Clinical Data: Pain and limited range of motion left shoulder post fall  LEFT SHOULDER - 2+ VIEW  Comparison: None  Findings: Osseous mineralization normal. AC joint alignment normal. No acute fracture, dislocation or bone destruction. Visualized left ribs intact.  IMPRESSION: No acute osseous abnormalities.   Original Report Authenticated By: Ulyses Southward, M.D.      No diagnosis found.    MDM  questionable radial head Fx  Patient has ortho in [place] who she will FU with  Patient refused pain medication stating tylenol or Ibuprofen was adequate         Arman Filter, NP 08/03/12 0157

## 2012-08-04 DIAGNOSIS — S52133A Displaced fracture of neck of unspecified radius, initial encounter for closed fracture: Secondary | ICD-10-CM | POA: Diagnosis not present

## 2012-08-18 DIAGNOSIS — S63509A Unspecified sprain of unspecified wrist, initial encounter: Secondary | ICD-10-CM | POA: Diagnosis not present

## 2012-08-18 DIAGNOSIS — IMO0002 Reserved for concepts with insufficient information to code with codable children: Secondary | ICD-10-CM | POA: Diagnosis not present

## 2012-08-18 DIAGNOSIS — S52123A Displaced fracture of head of unspecified radius, initial encounter for closed fracture: Secondary | ICD-10-CM | POA: Diagnosis not present

## 2012-09-03 DIAGNOSIS — E1039 Type 1 diabetes mellitus with other diabetic ophthalmic complication: Secondary | ICD-10-CM | POA: Diagnosis not present

## 2012-09-03 DIAGNOSIS — E1029 Type 1 diabetes mellitus with other diabetic kidney complication: Secondary | ICD-10-CM | POA: Diagnosis not present

## 2012-09-03 DIAGNOSIS — Z01818 Encounter for other preprocedural examination: Secondary | ICD-10-CM | POA: Diagnosis not present

## 2012-09-03 DIAGNOSIS — E119 Type 2 diabetes mellitus without complications: Secondary | ICD-10-CM | POA: Diagnosis not present

## 2012-09-03 DIAGNOSIS — N186 End stage renal disease: Secondary | ICD-10-CM | POA: Diagnosis not present

## 2012-09-03 DIAGNOSIS — I10 Essential (primary) hypertension: Secondary | ICD-10-CM | POA: Diagnosis not present

## 2012-09-03 DIAGNOSIS — E11319 Type 2 diabetes mellitus with unspecified diabetic retinopathy without macular edema: Secondary | ICD-10-CM | POA: Diagnosis not present

## 2012-09-10 ENCOUNTER — Encounter: Payer: Self-pay | Admitting: Physician Assistant

## 2012-10-01 DIAGNOSIS — H3560 Retinal hemorrhage, unspecified eye: Secondary | ICD-10-CM | POA: Diagnosis not present

## 2012-10-04 ENCOUNTER — Other Ambulatory Visit: Payer: Self-pay | Admitting: Cardiology

## 2013-02-04 ENCOUNTER — Encounter: Payer: Self-pay | Admitting: Gynecology

## 2013-03-10 DIAGNOSIS — Z01818 Encounter for other preprocedural examination: Secondary | ICD-10-CM | POA: Diagnosis not present

## 2013-04-20 DIAGNOSIS — M109 Gout, unspecified: Secondary | ICD-10-CM | POA: Diagnosis not present

## 2013-04-20 DIAGNOSIS — L02619 Cutaneous abscess of unspecified foot: Secondary | ICD-10-CM | POA: Diagnosis not present

## 2013-04-20 DIAGNOSIS — A5211 Tabes dorsalis: Secondary | ICD-10-CM | POA: Diagnosis not present

## 2013-04-26 DIAGNOSIS — D631 Anemia in chronic kidney disease: Secondary | ICD-10-CM | POA: Diagnosis not present

## 2013-04-26 DIAGNOSIS — E785 Hyperlipidemia, unspecified: Secondary | ICD-10-CM | POA: Diagnosis not present

## 2013-04-26 DIAGNOSIS — N186 End stage renal disease: Secondary | ICD-10-CM | POA: Diagnosis not present

## 2013-04-26 DIAGNOSIS — N2581 Secondary hyperparathyroidism of renal origin: Secondary | ICD-10-CM | POA: Diagnosis not present

## 2013-04-26 DIAGNOSIS — D509 Iron deficiency anemia, unspecified: Secondary | ICD-10-CM | POA: Diagnosis not present

## 2013-04-28 DIAGNOSIS — D509 Iron deficiency anemia, unspecified: Secondary | ICD-10-CM | POA: Diagnosis not present

## 2013-04-28 DIAGNOSIS — N2581 Secondary hyperparathyroidism of renal origin: Secondary | ICD-10-CM | POA: Diagnosis not present

## 2013-04-28 DIAGNOSIS — D631 Anemia in chronic kidney disease: Secondary | ICD-10-CM | POA: Diagnosis not present

## 2013-04-28 DIAGNOSIS — N186 End stage renal disease: Secondary | ICD-10-CM | POA: Diagnosis not present

## 2013-04-28 DIAGNOSIS — E785 Hyperlipidemia, unspecified: Secondary | ICD-10-CM | POA: Diagnosis not present

## 2013-04-30 DIAGNOSIS — D509 Iron deficiency anemia, unspecified: Secondary | ICD-10-CM | POA: Diagnosis not present

## 2013-04-30 DIAGNOSIS — N186 End stage renal disease: Secondary | ICD-10-CM | POA: Diagnosis not present

## 2013-04-30 DIAGNOSIS — N2581 Secondary hyperparathyroidism of renal origin: Secondary | ICD-10-CM | POA: Diagnosis not present

## 2013-04-30 DIAGNOSIS — D631 Anemia in chronic kidney disease: Secondary | ICD-10-CM | POA: Diagnosis not present

## 2013-04-30 DIAGNOSIS — E785 Hyperlipidemia, unspecified: Secondary | ICD-10-CM | POA: Diagnosis not present

## 2013-05-03 DIAGNOSIS — E785 Hyperlipidemia, unspecified: Secondary | ICD-10-CM | POA: Diagnosis not present

## 2013-05-03 DIAGNOSIS — N186 End stage renal disease: Secondary | ICD-10-CM | POA: Diagnosis not present

## 2013-05-03 DIAGNOSIS — D509 Iron deficiency anemia, unspecified: Secondary | ICD-10-CM | POA: Diagnosis not present

## 2013-05-03 DIAGNOSIS — N2581 Secondary hyperparathyroidism of renal origin: Secondary | ICD-10-CM | POA: Diagnosis not present

## 2013-05-03 DIAGNOSIS — D631 Anemia in chronic kidney disease: Secondary | ICD-10-CM | POA: Diagnosis not present

## 2013-05-05 DIAGNOSIS — D509 Iron deficiency anemia, unspecified: Secondary | ICD-10-CM | POA: Diagnosis not present

## 2013-05-05 DIAGNOSIS — D631 Anemia in chronic kidney disease: Secondary | ICD-10-CM | POA: Diagnosis not present

## 2013-05-05 DIAGNOSIS — E785 Hyperlipidemia, unspecified: Secondary | ICD-10-CM | POA: Diagnosis not present

## 2013-05-05 DIAGNOSIS — N2581 Secondary hyperparathyroidism of renal origin: Secondary | ICD-10-CM | POA: Diagnosis not present

## 2013-05-05 DIAGNOSIS — N186 End stage renal disease: Secondary | ICD-10-CM | POA: Diagnosis not present

## 2013-05-07 DIAGNOSIS — N186 End stage renal disease: Secondary | ICD-10-CM | POA: Diagnosis not present

## 2013-05-07 DIAGNOSIS — N2581 Secondary hyperparathyroidism of renal origin: Secondary | ICD-10-CM | POA: Diagnosis not present

## 2013-05-07 DIAGNOSIS — D509 Iron deficiency anemia, unspecified: Secondary | ICD-10-CM | POA: Diagnosis not present

## 2013-05-07 DIAGNOSIS — D631 Anemia in chronic kidney disease: Secondary | ICD-10-CM | POA: Diagnosis not present

## 2013-05-07 DIAGNOSIS — E785 Hyperlipidemia, unspecified: Secondary | ICD-10-CM | POA: Diagnosis not present

## 2013-05-10 DIAGNOSIS — E785 Hyperlipidemia, unspecified: Secondary | ICD-10-CM | POA: Diagnosis not present

## 2013-05-10 DIAGNOSIS — D631 Anemia in chronic kidney disease: Secondary | ICD-10-CM | POA: Diagnosis not present

## 2013-05-10 DIAGNOSIS — N186 End stage renal disease: Secondary | ICD-10-CM | POA: Diagnosis not present

## 2013-05-10 DIAGNOSIS — D509 Iron deficiency anemia, unspecified: Secondary | ICD-10-CM | POA: Diagnosis not present

## 2013-05-10 DIAGNOSIS — N2581 Secondary hyperparathyroidism of renal origin: Secondary | ICD-10-CM | POA: Diagnosis not present

## 2013-05-11 DIAGNOSIS — B351 Tinea unguium: Secondary | ICD-10-CM | POA: Diagnosis not present

## 2013-05-11 DIAGNOSIS — A5211 Tabes dorsalis: Secondary | ICD-10-CM | POA: Diagnosis not present

## 2013-05-11 DIAGNOSIS — E1149 Type 2 diabetes mellitus with other diabetic neurological complication: Secondary | ICD-10-CM | POA: Diagnosis not present

## 2013-05-11 DIAGNOSIS — L851 Acquired keratosis [keratoderma] palmaris et plantaris: Secondary | ICD-10-CM | POA: Diagnosis not present

## 2013-05-12 DIAGNOSIS — N2581 Secondary hyperparathyroidism of renal origin: Secondary | ICD-10-CM | POA: Diagnosis not present

## 2013-05-12 DIAGNOSIS — D509 Iron deficiency anemia, unspecified: Secondary | ICD-10-CM | POA: Diagnosis not present

## 2013-05-12 DIAGNOSIS — N186 End stage renal disease: Secondary | ICD-10-CM | POA: Diagnosis not present

## 2013-05-12 DIAGNOSIS — E785 Hyperlipidemia, unspecified: Secondary | ICD-10-CM | POA: Diagnosis not present

## 2013-05-12 DIAGNOSIS — D631 Anemia in chronic kidney disease: Secondary | ICD-10-CM | POA: Diagnosis not present

## 2013-05-13 DIAGNOSIS — D235 Other benign neoplasm of skin of trunk: Secondary | ICD-10-CM | POA: Diagnosis not present

## 2013-05-13 DIAGNOSIS — L821 Other seborrheic keratosis: Secondary | ICD-10-CM | POA: Diagnosis not present

## 2013-05-14 DIAGNOSIS — D631 Anemia in chronic kidney disease: Secondary | ICD-10-CM | POA: Diagnosis not present

## 2013-05-14 DIAGNOSIS — E785 Hyperlipidemia, unspecified: Secondary | ICD-10-CM | POA: Diagnosis not present

## 2013-05-14 DIAGNOSIS — D509 Iron deficiency anemia, unspecified: Secondary | ICD-10-CM | POA: Diagnosis not present

## 2013-05-14 DIAGNOSIS — N2581 Secondary hyperparathyroidism of renal origin: Secondary | ICD-10-CM | POA: Diagnosis not present

## 2013-05-14 DIAGNOSIS — N186 End stage renal disease: Secondary | ICD-10-CM | POA: Diagnosis not present

## 2013-05-16 DIAGNOSIS — D509 Iron deficiency anemia, unspecified: Secondary | ICD-10-CM | POA: Diagnosis not present

## 2013-05-16 DIAGNOSIS — D631 Anemia in chronic kidney disease: Secondary | ICD-10-CM | POA: Diagnosis not present

## 2013-05-16 DIAGNOSIS — N2581 Secondary hyperparathyroidism of renal origin: Secondary | ICD-10-CM | POA: Diagnosis not present

## 2013-05-16 DIAGNOSIS — E785 Hyperlipidemia, unspecified: Secondary | ICD-10-CM | POA: Diagnosis not present

## 2013-05-16 DIAGNOSIS — N186 End stage renal disease: Secondary | ICD-10-CM | POA: Diagnosis not present

## 2013-05-18 DIAGNOSIS — E785 Hyperlipidemia, unspecified: Secondary | ICD-10-CM | POA: Diagnosis not present

## 2013-05-18 DIAGNOSIS — D509 Iron deficiency anemia, unspecified: Secondary | ICD-10-CM | POA: Diagnosis not present

## 2013-05-18 DIAGNOSIS — N186 End stage renal disease: Secondary | ICD-10-CM | POA: Diagnosis not present

## 2013-05-18 DIAGNOSIS — D631 Anemia in chronic kidney disease: Secondary | ICD-10-CM | POA: Diagnosis not present

## 2013-05-18 DIAGNOSIS — N2581 Secondary hyperparathyroidism of renal origin: Secondary | ICD-10-CM | POA: Diagnosis not present

## 2013-05-21 DIAGNOSIS — N186 End stage renal disease: Secondary | ICD-10-CM | POA: Diagnosis not present

## 2013-05-21 DIAGNOSIS — E785 Hyperlipidemia, unspecified: Secondary | ICD-10-CM | POA: Diagnosis not present

## 2013-05-21 DIAGNOSIS — D509 Iron deficiency anemia, unspecified: Secondary | ICD-10-CM | POA: Diagnosis not present

## 2013-05-21 DIAGNOSIS — D631 Anemia in chronic kidney disease: Secondary | ICD-10-CM | POA: Diagnosis not present

## 2013-05-21 DIAGNOSIS — N2581 Secondary hyperparathyroidism of renal origin: Secondary | ICD-10-CM | POA: Diagnosis not present

## 2013-05-23 DIAGNOSIS — N186 End stage renal disease: Secondary | ICD-10-CM | POA: Diagnosis not present

## 2013-05-23 DIAGNOSIS — E785 Hyperlipidemia, unspecified: Secondary | ICD-10-CM | POA: Diagnosis not present

## 2013-05-23 DIAGNOSIS — D509 Iron deficiency anemia, unspecified: Secondary | ICD-10-CM | POA: Diagnosis not present

## 2013-05-23 DIAGNOSIS — D631 Anemia in chronic kidney disease: Secondary | ICD-10-CM | POA: Diagnosis not present

## 2013-05-23 DIAGNOSIS — N2581 Secondary hyperparathyroidism of renal origin: Secondary | ICD-10-CM | POA: Diagnosis not present

## 2013-05-25 DIAGNOSIS — D631 Anemia in chronic kidney disease: Secondary | ICD-10-CM | POA: Diagnosis not present

## 2013-05-25 DIAGNOSIS — N2581 Secondary hyperparathyroidism of renal origin: Secondary | ICD-10-CM | POA: Diagnosis not present

## 2013-05-25 DIAGNOSIS — N186 End stage renal disease: Secondary | ICD-10-CM | POA: Diagnosis not present

## 2013-05-25 DIAGNOSIS — E785 Hyperlipidemia, unspecified: Secondary | ICD-10-CM | POA: Diagnosis not present

## 2013-05-25 DIAGNOSIS — D509 Iron deficiency anemia, unspecified: Secondary | ICD-10-CM | POA: Diagnosis not present

## 2013-05-26 DIAGNOSIS — N186 End stage renal disease: Secondary | ICD-10-CM | POA: Diagnosis not present

## 2013-05-28 DIAGNOSIS — D631 Anemia in chronic kidney disease: Secondary | ICD-10-CM | POA: Diagnosis not present

## 2013-05-28 DIAGNOSIS — N039 Chronic nephritic syndrome with unspecified morphologic changes: Secondary | ICD-10-CM | POA: Diagnosis not present

## 2013-05-28 DIAGNOSIS — N2581 Secondary hyperparathyroidism of renal origin: Secondary | ICD-10-CM | POA: Diagnosis not present

## 2013-05-28 DIAGNOSIS — E785 Hyperlipidemia, unspecified: Secondary | ICD-10-CM | POA: Diagnosis not present

## 2013-05-28 DIAGNOSIS — N186 End stage renal disease: Secondary | ICD-10-CM | POA: Diagnosis not present

## 2013-05-28 DIAGNOSIS — D509 Iron deficiency anemia, unspecified: Secondary | ICD-10-CM | POA: Diagnosis not present

## 2013-05-31 DIAGNOSIS — E785 Hyperlipidemia, unspecified: Secondary | ICD-10-CM | POA: Diagnosis not present

## 2013-05-31 DIAGNOSIS — D631 Anemia in chronic kidney disease: Secondary | ICD-10-CM | POA: Diagnosis not present

## 2013-05-31 DIAGNOSIS — D509 Iron deficiency anemia, unspecified: Secondary | ICD-10-CM | POA: Diagnosis not present

## 2013-05-31 DIAGNOSIS — N186 End stage renal disease: Secondary | ICD-10-CM | POA: Diagnosis not present

## 2013-05-31 DIAGNOSIS — N039 Chronic nephritic syndrome with unspecified morphologic changes: Secondary | ICD-10-CM | POA: Diagnosis not present

## 2013-05-31 DIAGNOSIS — N2581 Secondary hyperparathyroidism of renal origin: Secondary | ICD-10-CM | POA: Diagnosis not present

## 2013-06-01 DIAGNOSIS — T82898A Other specified complication of vascular prosthetic devices, implants and grafts, initial encounter: Secondary | ICD-10-CM | POA: Diagnosis not present

## 2013-06-01 DIAGNOSIS — N186 End stage renal disease: Secondary | ICD-10-CM | POA: Diagnosis not present

## 2013-06-01 DIAGNOSIS — I871 Compression of vein: Secondary | ICD-10-CM | POA: Diagnosis not present

## 2013-06-02 DIAGNOSIS — E785 Hyperlipidemia, unspecified: Secondary | ICD-10-CM | POA: Diagnosis not present

## 2013-06-02 DIAGNOSIS — D509 Iron deficiency anemia, unspecified: Secondary | ICD-10-CM | POA: Diagnosis not present

## 2013-06-02 DIAGNOSIS — D631 Anemia in chronic kidney disease: Secondary | ICD-10-CM | POA: Diagnosis not present

## 2013-06-02 DIAGNOSIS — N186 End stage renal disease: Secondary | ICD-10-CM | POA: Diagnosis not present

## 2013-06-02 DIAGNOSIS — N2581 Secondary hyperparathyroidism of renal origin: Secondary | ICD-10-CM | POA: Diagnosis not present

## 2013-06-04 DIAGNOSIS — D631 Anemia in chronic kidney disease: Secondary | ICD-10-CM | POA: Diagnosis not present

## 2013-06-04 DIAGNOSIS — D509 Iron deficiency anemia, unspecified: Secondary | ICD-10-CM | POA: Diagnosis not present

## 2013-06-04 DIAGNOSIS — N186 End stage renal disease: Secondary | ICD-10-CM | POA: Diagnosis not present

## 2013-06-04 DIAGNOSIS — E785 Hyperlipidemia, unspecified: Secondary | ICD-10-CM | POA: Diagnosis not present

## 2013-06-04 DIAGNOSIS — N2581 Secondary hyperparathyroidism of renal origin: Secondary | ICD-10-CM | POA: Diagnosis not present

## 2013-06-07 DIAGNOSIS — D631 Anemia in chronic kidney disease: Secondary | ICD-10-CM | POA: Diagnosis not present

## 2013-06-07 DIAGNOSIS — N2581 Secondary hyperparathyroidism of renal origin: Secondary | ICD-10-CM | POA: Diagnosis not present

## 2013-06-07 DIAGNOSIS — E785 Hyperlipidemia, unspecified: Secondary | ICD-10-CM | POA: Diagnosis not present

## 2013-06-07 DIAGNOSIS — N186 End stage renal disease: Secondary | ICD-10-CM | POA: Diagnosis not present

## 2013-06-07 DIAGNOSIS — D509 Iron deficiency anemia, unspecified: Secondary | ICD-10-CM | POA: Diagnosis not present

## 2013-06-09 DIAGNOSIS — N186 End stage renal disease: Secondary | ICD-10-CM | POA: Diagnosis not present

## 2013-06-09 DIAGNOSIS — E785 Hyperlipidemia, unspecified: Secondary | ICD-10-CM | POA: Diagnosis not present

## 2013-06-09 DIAGNOSIS — D631 Anemia in chronic kidney disease: Secondary | ICD-10-CM | POA: Diagnosis not present

## 2013-06-09 DIAGNOSIS — D509 Iron deficiency anemia, unspecified: Secondary | ICD-10-CM | POA: Diagnosis not present

## 2013-06-09 DIAGNOSIS — N2581 Secondary hyperparathyroidism of renal origin: Secondary | ICD-10-CM | POA: Diagnosis not present

## 2013-06-11 DIAGNOSIS — N2581 Secondary hyperparathyroidism of renal origin: Secondary | ICD-10-CM | POA: Diagnosis not present

## 2013-06-11 DIAGNOSIS — N039 Chronic nephritic syndrome with unspecified morphologic changes: Secondary | ICD-10-CM | POA: Diagnosis not present

## 2013-06-11 DIAGNOSIS — D509 Iron deficiency anemia, unspecified: Secondary | ICD-10-CM | POA: Diagnosis not present

## 2013-06-11 DIAGNOSIS — N186 End stage renal disease: Secondary | ICD-10-CM | POA: Diagnosis not present

## 2013-06-11 DIAGNOSIS — D631 Anemia in chronic kidney disease: Secondary | ICD-10-CM | POA: Diagnosis not present

## 2013-06-11 DIAGNOSIS — E785 Hyperlipidemia, unspecified: Secondary | ICD-10-CM | POA: Diagnosis not present

## 2013-06-14 DIAGNOSIS — N186 End stage renal disease: Secondary | ICD-10-CM | POA: Diagnosis not present

## 2013-06-14 DIAGNOSIS — N2581 Secondary hyperparathyroidism of renal origin: Secondary | ICD-10-CM | POA: Diagnosis not present

## 2013-06-14 DIAGNOSIS — N039 Chronic nephritic syndrome with unspecified morphologic changes: Secondary | ICD-10-CM | POA: Diagnosis not present

## 2013-06-14 DIAGNOSIS — D509 Iron deficiency anemia, unspecified: Secondary | ICD-10-CM | POA: Diagnosis not present

## 2013-06-14 DIAGNOSIS — E785 Hyperlipidemia, unspecified: Secondary | ICD-10-CM | POA: Diagnosis not present

## 2013-06-14 DIAGNOSIS — D631 Anemia in chronic kidney disease: Secondary | ICD-10-CM | POA: Diagnosis not present

## 2013-06-15 DIAGNOSIS — D235 Other benign neoplasm of skin of trunk: Secondary | ICD-10-CM | POA: Diagnosis not present

## 2013-06-16 DIAGNOSIS — N186 End stage renal disease: Secondary | ICD-10-CM | POA: Diagnosis not present

## 2013-06-16 DIAGNOSIS — D631 Anemia in chronic kidney disease: Secondary | ICD-10-CM | POA: Diagnosis not present

## 2013-06-16 DIAGNOSIS — E785 Hyperlipidemia, unspecified: Secondary | ICD-10-CM | POA: Diagnosis not present

## 2013-06-16 DIAGNOSIS — N2581 Secondary hyperparathyroidism of renal origin: Secondary | ICD-10-CM | POA: Diagnosis not present

## 2013-06-16 DIAGNOSIS — D509 Iron deficiency anemia, unspecified: Secondary | ICD-10-CM | POA: Diagnosis not present

## 2013-06-18 DIAGNOSIS — N2581 Secondary hyperparathyroidism of renal origin: Secondary | ICD-10-CM | POA: Diagnosis not present

## 2013-06-18 DIAGNOSIS — N039 Chronic nephritic syndrome with unspecified morphologic changes: Secondary | ICD-10-CM | POA: Diagnosis not present

## 2013-06-18 DIAGNOSIS — D631 Anemia in chronic kidney disease: Secondary | ICD-10-CM | POA: Diagnosis not present

## 2013-06-18 DIAGNOSIS — D509 Iron deficiency anemia, unspecified: Secondary | ICD-10-CM | POA: Diagnosis not present

## 2013-06-18 DIAGNOSIS — E785 Hyperlipidemia, unspecified: Secondary | ICD-10-CM | POA: Diagnosis not present

## 2013-06-18 DIAGNOSIS — N186 End stage renal disease: Secondary | ICD-10-CM | POA: Diagnosis not present

## 2013-06-21 DIAGNOSIS — D631 Anemia in chronic kidney disease: Secondary | ICD-10-CM | POA: Diagnosis not present

## 2013-06-21 DIAGNOSIS — D509 Iron deficiency anemia, unspecified: Secondary | ICD-10-CM | POA: Diagnosis not present

## 2013-06-21 DIAGNOSIS — N186 End stage renal disease: Secondary | ICD-10-CM | POA: Diagnosis not present

## 2013-06-21 DIAGNOSIS — E785 Hyperlipidemia, unspecified: Secondary | ICD-10-CM | POA: Diagnosis not present

## 2013-06-21 DIAGNOSIS — N2581 Secondary hyperparathyroidism of renal origin: Secondary | ICD-10-CM | POA: Diagnosis not present

## 2013-06-23 DIAGNOSIS — D631 Anemia in chronic kidney disease: Secondary | ICD-10-CM | POA: Diagnosis not present

## 2013-06-23 DIAGNOSIS — E785 Hyperlipidemia, unspecified: Secondary | ICD-10-CM | POA: Diagnosis not present

## 2013-06-23 DIAGNOSIS — D509 Iron deficiency anemia, unspecified: Secondary | ICD-10-CM | POA: Diagnosis not present

## 2013-06-23 DIAGNOSIS — N186 End stage renal disease: Secondary | ICD-10-CM | POA: Diagnosis not present

## 2013-06-23 DIAGNOSIS — N2581 Secondary hyperparathyroidism of renal origin: Secondary | ICD-10-CM | POA: Diagnosis not present

## 2013-06-23 DIAGNOSIS — E1129 Type 2 diabetes mellitus with other diabetic kidney complication: Secondary | ICD-10-CM | POA: Diagnosis not present

## 2013-06-25 ENCOUNTER — Other Ambulatory Visit: Payer: Self-pay | Admitting: Physician Assistant

## 2013-06-25 DIAGNOSIS — D509 Iron deficiency anemia, unspecified: Secondary | ICD-10-CM | POA: Diagnosis not present

## 2013-06-25 DIAGNOSIS — N186 End stage renal disease: Secondary | ICD-10-CM | POA: Diagnosis not present

## 2013-06-25 DIAGNOSIS — E785 Hyperlipidemia, unspecified: Secondary | ICD-10-CM | POA: Diagnosis not present

## 2013-06-25 DIAGNOSIS — D631 Anemia in chronic kidney disease: Secondary | ICD-10-CM | POA: Diagnosis not present

## 2013-06-25 DIAGNOSIS — N2581 Secondary hyperparathyroidism of renal origin: Secondary | ICD-10-CM | POA: Diagnosis not present

## 2013-06-26 DIAGNOSIS — N186 End stage renal disease: Secondary | ICD-10-CM | POA: Diagnosis not present

## 2013-06-28 DIAGNOSIS — D631 Anemia in chronic kidney disease: Secondary | ICD-10-CM | POA: Diagnosis not present

## 2013-06-28 DIAGNOSIS — D509 Iron deficiency anemia, unspecified: Secondary | ICD-10-CM | POA: Diagnosis not present

## 2013-06-28 DIAGNOSIS — N186 End stage renal disease: Secondary | ICD-10-CM | POA: Diagnosis not present

## 2013-06-28 DIAGNOSIS — N2581 Secondary hyperparathyroidism of renal origin: Secondary | ICD-10-CM | POA: Diagnosis not present

## 2013-06-30 DIAGNOSIS — N2581 Secondary hyperparathyroidism of renal origin: Secondary | ICD-10-CM | POA: Diagnosis not present

## 2013-06-30 DIAGNOSIS — N039 Chronic nephritic syndrome with unspecified morphologic changes: Secondary | ICD-10-CM | POA: Diagnosis not present

## 2013-06-30 DIAGNOSIS — D631 Anemia in chronic kidney disease: Secondary | ICD-10-CM | POA: Diagnosis not present

## 2013-06-30 DIAGNOSIS — D509 Iron deficiency anemia, unspecified: Secondary | ICD-10-CM | POA: Diagnosis not present

## 2013-06-30 DIAGNOSIS — N186 End stage renal disease: Secondary | ICD-10-CM | POA: Diagnosis not present

## 2013-07-01 DIAGNOSIS — E1039 Type 1 diabetes mellitus with other diabetic ophthalmic complication: Secondary | ICD-10-CM | POA: Diagnosis not present

## 2013-07-01 DIAGNOSIS — H3560 Retinal hemorrhage, unspecified eye: Secondary | ICD-10-CM | POA: Diagnosis not present

## 2013-07-01 DIAGNOSIS — E11359 Type 2 diabetes mellitus with proliferative diabetic retinopathy without macular edema: Secondary | ICD-10-CM | POA: Diagnosis not present

## 2013-07-01 DIAGNOSIS — Z9889 Other specified postprocedural states: Secondary | ICD-10-CM | POA: Diagnosis not present

## 2013-07-01 DIAGNOSIS — H4310 Vitreous hemorrhage, unspecified eye: Secondary | ICD-10-CM | POA: Diagnosis not present

## 2013-07-02 DIAGNOSIS — N2581 Secondary hyperparathyroidism of renal origin: Secondary | ICD-10-CM | POA: Diagnosis not present

## 2013-07-02 DIAGNOSIS — N039 Chronic nephritic syndrome with unspecified morphologic changes: Secondary | ICD-10-CM | POA: Diagnosis not present

## 2013-07-02 DIAGNOSIS — D631 Anemia in chronic kidney disease: Secondary | ICD-10-CM | POA: Diagnosis not present

## 2013-07-02 DIAGNOSIS — D509 Iron deficiency anemia, unspecified: Secondary | ICD-10-CM | POA: Diagnosis not present

## 2013-07-02 DIAGNOSIS — N186 End stage renal disease: Secondary | ICD-10-CM | POA: Diagnosis not present

## 2013-07-05 ENCOUNTER — Other Ambulatory Visit: Payer: Self-pay

## 2013-07-05 DIAGNOSIS — D631 Anemia in chronic kidney disease: Secondary | ICD-10-CM | POA: Diagnosis not present

## 2013-07-05 DIAGNOSIS — D509 Iron deficiency anemia, unspecified: Secondary | ICD-10-CM | POA: Diagnosis not present

## 2013-07-05 DIAGNOSIS — N2581 Secondary hyperparathyroidism of renal origin: Secondary | ICD-10-CM | POA: Diagnosis not present

## 2013-07-05 DIAGNOSIS — N186 End stage renal disease: Secondary | ICD-10-CM | POA: Diagnosis not present

## 2013-07-05 MED ORDER — AMLODIPINE BESYLATE 10 MG PO TABS: 10.0000 mg | ORAL_TABLET | Freq: Every day | ORAL | Status: DC

## 2013-07-06 ENCOUNTER — Emergency Department (HOSPITAL_COMMUNITY): Payer: Medicare Other

## 2013-07-06 ENCOUNTER — Encounter (HOSPITAL_COMMUNITY): Payer: Self-pay | Admitting: Emergency Medicine

## 2013-07-06 DIAGNOSIS — I2510 Atherosclerotic heart disease of native coronary artery without angina pectoris: Secondary | ICD-10-CM | POA: Insufficient documentation

## 2013-07-06 DIAGNOSIS — Z87891 Personal history of nicotine dependence: Secondary | ICD-10-CM | POA: Diagnosis not present

## 2013-07-06 DIAGNOSIS — Z872 Personal history of diseases of the skin and subcutaneous tissue: Secondary | ICD-10-CM | POA: Diagnosis not present

## 2013-07-06 DIAGNOSIS — R05 Cough: Secondary | ICD-10-CM | POA: Diagnosis not present

## 2013-07-06 DIAGNOSIS — G609 Hereditary and idiopathic neuropathy, unspecified: Secondary | ICD-10-CM | POA: Insufficient documentation

## 2013-07-06 DIAGNOSIS — E785 Hyperlipidemia, unspecified: Secondary | ICD-10-CM | POA: Diagnosis not present

## 2013-07-06 DIAGNOSIS — I120 Hypertensive chronic kidney disease with stage 5 chronic kidney disease or end stage renal disease: Secondary | ICD-10-CM | POA: Diagnosis not present

## 2013-07-06 DIAGNOSIS — I739 Peripheral vascular disease, unspecified: Secondary | ICD-10-CM | POA: Diagnosis not present

## 2013-07-06 DIAGNOSIS — Z794 Long term (current) use of insulin: Secondary | ICD-10-CM | POA: Insufficient documentation

## 2013-07-06 DIAGNOSIS — Z79899 Other long term (current) drug therapy: Secondary | ICD-10-CM | POA: Diagnosis not present

## 2013-07-06 DIAGNOSIS — Z8719 Personal history of other diseases of the digestive system: Secondary | ICD-10-CM | POA: Insufficient documentation

## 2013-07-06 DIAGNOSIS — Z7902 Long term (current) use of antithrombotics/antiplatelets: Secondary | ICD-10-CM | POA: Diagnosis not present

## 2013-07-06 DIAGNOSIS — Z8742 Personal history of other diseases of the female genital tract: Secondary | ICD-10-CM | POA: Diagnosis not present

## 2013-07-06 DIAGNOSIS — Z992 Dependence on renal dialysis: Secondary | ICD-10-CM | POA: Insufficient documentation

## 2013-07-06 DIAGNOSIS — R059 Cough, unspecified: Secondary | ICD-10-CM | POA: Diagnosis not present

## 2013-07-06 DIAGNOSIS — J40 Bronchitis, not specified as acute or chronic: Secondary | ICD-10-CM | POA: Diagnosis not present

## 2013-07-06 DIAGNOSIS — Z862 Personal history of diseases of the blood and blood-forming organs and certain disorders involving the immune mechanism: Secondary | ICD-10-CM | POA: Insufficient documentation

## 2013-07-06 DIAGNOSIS — N186 End stage renal disease: Secondary | ICD-10-CM | POA: Insufficient documentation

## 2013-07-06 DIAGNOSIS — E119 Type 2 diabetes mellitus without complications: Secondary | ICD-10-CM | POA: Diagnosis not present

## 2013-07-06 DIAGNOSIS — J209 Acute bronchitis, unspecified: Secondary | ICD-10-CM | POA: Diagnosis not present

## 2013-07-06 DIAGNOSIS — R0602 Shortness of breath: Secondary | ICD-10-CM | POA: Diagnosis not present

## 2013-07-06 NOTE — ED Notes (Signed)
Pt. reports dry cough and mild SOB onset this afternoon , hemodialysis Mon / Wed/ Fri . , denies fever .

## 2013-07-07 ENCOUNTER — Emergency Department (HOSPITAL_COMMUNITY)
Admission: EM | Admit: 2013-07-07 | Discharge: 2013-07-07 | Disposition: A | Discharge status: Home / Self Care | Payer: Medicare Other | Attending: Emergency Medicine | Admitting: Emergency Medicine

## 2013-07-07 DIAGNOSIS — D631 Anemia in chronic kidney disease: Secondary | ICD-10-CM | POA: Diagnosis not present

## 2013-07-07 DIAGNOSIS — N186 End stage renal disease: Secondary | ICD-10-CM | POA: Diagnosis not present

## 2013-07-07 DIAGNOSIS — J40 Bronchitis, not specified as acute or chronic: Secondary | ICD-10-CM

## 2013-07-07 DIAGNOSIS — R0600 Dyspnea, unspecified: Secondary | ICD-10-CM

## 2013-07-07 DIAGNOSIS — D509 Iron deficiency anemia, unspecified: Secondary | ICD-10-CM | POA: Diagnosis not present

## 2013-07-07 DIAGNOSIS — N2581 Secondary hyperparathyroidism of renal origin: Secondary | ICD-10-CM | POA: Diagnosis not present

## 2013-07-07 LAB — CBC WITH DIFFERENTIAL/PLATELET
Basophils Absolute: 0 10*3/uL (ref 0.0–0.1)
Basophils Relative: 0 % (ref 0–1)
EOS ABS: 0.4 10*3/uL (ref 0.0–0.7)
Eosinophils Relative: 4 % (ref 0–5)
HCT: 32.3 % — ABNORMAL LOW (ref 36.0–46.0)
HEMOGLOBIN: 10.4 g/dL — AB (ref 12.0–15.0)
LYMPHS ABS: 2.2 10*3/uL (ref 0.7–4.0)
Lymphocytes Relative: 21 % (ref 12–46)
MCH: 32.2 pg (ref 26.0–34.0)
MCHC: 32.2 g/dL (ref 30.0–36.0)
MCV: 100 fL (ref 78.0–100.0)
MONOS PCT: 6 % (ref 3–12)
Monocytes Absolute: 0.6 10*3/uL (ref 0.1–1.0)
NEUTROS PCT: 69 % (ref 43–77)
Neutro Abs: 7.4 10*3/uL (ref 1.7–7.7)
Platelets: 219 10*3/uL (ref 150–400)
RBC: 3.23 MIL/uL — AB (ref 3.87–5.11)
RDW: 13.2 % (ref 11.5–15.5)
WBC: 10.6 10*3/uL — ABNORMAL HIGH (ref 4.0–10.5)

## 2013-07-07 LAB — COMPREHENSIVE METABOLIC PANEL
ALK PHOS: 58 U/L (ref 39–117)
ALT: 13 U/L (ref 0–35)
AST: 9 U/L (ref 0–37)
Albumin: 3.5 g/dL (ref 3.5–5.2)
BILIRUBIN TOTAL: 0.3 mg/dL (ref 0.3–1.2)
BUN: 61 mg/dL — AB (ref 6–23)
CHLORIDE: 95 meq/L — AB (ref 96–112)
CO2: 25 mEq/L (ref 19–32)
Calcium: 9.9 mg/dL (ref 8.4–10.5)
Creatinine, Ser: 7.9 mg/dL — ABNORMAL HIGH (ref 0.50–1.10)
GFR, EST AFRICAN AMERICAN: 7 mL/min — AB (ref >90)
GFR, EST NON AFRICAN AMERICAN: 6 mL/min — AB (ref >90)
GLUCOSE: 188 mg/dL — AB (ref 70–99)
POTASSIUM: 5.5 meq/L — AB (ref 3.7–5.3)
Sodium: 139 mEq/L (ref 137–147)
TOTAL PROTEIN: 7.1 g/dL (ref 6.0–8.3)

## 2013-07-07 LAB — PRO B NATRIURETIC PEPTIDE: Pro B Natriuretic peptide (BNP): 9697 pg/mL — ABNORMAL HIGH (ref 0–125)

## 2013-07-07 LAB — TROPONIN I

## 2013-07-07 MED ORDER — ALBUTEROL SULFATE HFA 108 (90 BASE) MCG/ACT IN AERS
2.0000 | INHALATION_SPRAY | Freq: Once | RESPIRATORY_TRACT | Status: AC
  Administered 2013-07-07: 2 via RESPIRATORY_TRACT
  Filled 2013-07-07: qty 6.7

## 2013-07-07 MED ORDER — METHYLPREDNISOLONE SODIUM SUCC 125 MG IJ SOLR
125.0000 mg | Freq: Once | INTRAMUSCULAR | Status: AC
  Administered 2013-07-07: 125 mg via INTRAVENOUS
  Filled 2013-07-07: qty 2

## 2013-07-07 MED ORDER — PREDNISONE 50 MG PO TABS: 50.0000 mg | ORAL_TABLET | Freq: Every day | ORAL | Status: DC

## 2013-07-07 MED ORDER — ALBUTEROL SULFATE (2.5 MG/3ML) 0.083% IN NEBU
5.0000 mg | INHALATION_SOLUTION | Freq: Once | RESPIRATORY_TRACT | Status: AC
  Administered 2013-07-07: 5 mg via RESPIRATORY_TRACT
  Filled 2013-07-07: qty 6

## 2013-07-07 NOTE — ED Notes (Signed)
MD at bedside. (ED Physician to update on the plan of care.)

## 2013-07-07 NOTE — ED Provider Notes (Signed)
CSN: [id number]     Arrival date & time 07/06/13  2219 History   First MD Initiated Contact with Patient 07/07/13 0224     Chief Complaint  Patient presents with  . Cough     (Consider location/radiation/quality/duration/timing/severity/associated sxs/prior Treatment) HPI Patient presents with 2 days of shortness of breath. She states this is worse when she is lying flat. She has nonproductive cough but no fever. She denies any chest pain or lower extremity swelling. Last dialysis was on Monday and she is due for dialysis again today. Past Medical History  Diagnosis Date  . DM (diabetes mellitus)     Long-term insulin  . Hypertension   . Tobacco use disorder     Discontinued March 2012  . Hyperlipidemia     Hypertriglyceridemia 449 HDL 25  . Diabetic Charcot's joint disease   . Coronary artery disease     Status post cardiac catheterization June 2012 scattered coronary artery disease/atherosclerosis with 70-80% stenosis in a small right PDA.  . Gastroparesis   . Peripheral vascular disease     Tibial occlusive disease evaluated by Dr. Kellie Simmering in August 2011. Medical therapy  . 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 CIN III (cervical intraepithelial neoplasia grade III) with severe dysplasia     S/P LEEP AND [facility]E  . Chronic anemia     2nd to renal disease  . End stage renal disease on dialysis 05/02/11    [facility]; M; W, F; last time 05/01/11  . Migraines     "just on dialysis days"  . Peripheral neuropathy     related to DM  . History of abscesses in groins 12/06/2010   Past Surgical History  Procedure Laterality Date  . Av fistula placement  04/2010  . Dilation and curettage of uterus  2009  . Cervical biopsy  w/ loop electrode excision      h/o  . Cervical cone biopsy      h/o  . Colposcopy     Family History  Problem Relation Age of Onset  . Diabetes Mother   . Diabetes Father   . Diabetes Sister   . Breast cancer Maternal Aunt     Age 3's   History  Substance Use Topics   . Smoking status: Former Smoker -- 0.30 packs/day for 10 years    Types: Cigarettes    Quit date: 08/05/2011  . Smokeless tobacco: Never Used     Comment: "quit smoking cigarettes 07/2010"  . Alcohol Use: No   OB History   Grav Para Term Preterm Abortions TAB SAB Ect Mult Living   2 1 1  1     1      Review of Systems  Constitutional: Negative for fever and chills.  HENT: Negative for congestion and sore throat.   Respiratory: Positive for shortness of breath. Negative for cough and wheezing.   Cardiovascular: Negative for chest pain, palpitations and leg swelling.  Gastrointestinal: Negative for nausea, vomiting, abdominal pain and diarrhea.  Musculoskeletal: Negative for back pain, neck pain and neck stiffness.  Skin: Negative for rash and wound.  Neurological: Negative for dizziness, weakness, light-headedness, numbness and headaches.  All other systems reviewed and are negative.      Allergies  Cephalexin and Sulfamethoxazole-trimethoprim  Home Medications   Current Outpatient Rx  Name  Route  Sig  Dispense  Refill  . amLODipine (NORVASC) 10 MG tablet   Oral   Take 1 tablet (10 mg total) by mouth daily.  30 tablet   0     .Marland KitchenPatient needs to contact office to schedule  App ...   . atorvastatin (LIPITOR) 80 MG tablet   Oral   Take 1 tablet (80 mg total) by mouth every evening.   30 tablet   3   . calcium acetate (PHOSLO) 667 MG capsule   Oral   Take 2,001 mg by mouth 3 (three) times daily with meals.         . cloNIDine (CATAPRES) 0.1 MG tablet   Oral   Take 0.1 mg by mouth 2 (two) times daily.         . clopidogrel (PLAVIX) 75 MG tablet   Oral   Take 75 mg by mouth daily.         . clopidogrel (PLAVIX) 75 MG tablet      TAKE 1 TABLET EVERY DAY   30 tablet   6   . ibuprofen (ADVIL,MOTRIN) 200 MG tablet   Oral   Take 600 mg by mouth every 8 (eight) hours as needed. For pain.         Marland Kitchen insulin glargine (LANTUS) 100 UNIT/ML injection    Subcutaneous   Inject 26-28 Units into the skin 2 (two) times daily. 28 units in the am and 26 units in the pm         . insulin lispro (HUMALOG) 100 UNIT/ML injection   Subcutaneous   Inject 6-8 Units into the skin 3 (three) times daily before meals. Per sliding scale         . isosorbide mononitrate (IMDUR) 120 MG 24 hr tablet   Oral   Take 120 mg by mouth daily.         . isosorbide mononitrate (IMDUR) 120 MG 24 hr tablet      TAKE 1 TABLET BY MOUTH EVERY DAY   30 tablet   3     Patient needs to call office and schedule one mont ...   . isosorbide mononitrate (IMDUR) 120 MG 24 hr tablet      TAKE 1 TABLET BY MOUTH EVERY DAY   30 tablet   6   . labetalol (NORMODYNE) 300 MG tablet   Oral   Take 1 tablet (300 mg total) by mouth 3 (three) times daily.   270 tablet   3   . nitroGLYCERIN (NITROSTAT) 0.4 MG SL tablet   Sublingual   Place 0.4 mg under the tongue every 5 (five) minutes as needed. For chest pain          BP 130/80  Pulse 80  Temp(Src) 98.1 F (36.7 C) (Oral)  Resp 17  Ht 6\' 4"  (1.93 m)  Wt 244 lb (110.678 kg)  BMI 29.71 kg/m2  SpO2 99%  LMP 06/05/2013 Physical Exam  Nursing note and vitals reviewed. Constitutional: She is oriented to person, place, and time. She appears well-developed and well-nourished. No distress.  HENT:  Head: Normocephalic and atraumatic.  Mouth/Throat: Oropharynx is clear and moist.  Eyes: EOM are normal. Pupils are equal, round, and reactive to light.  Neck: Normal range of motion. Neck supple.  Cardiovascular: Normal rate and regular rhythm.   Pulmonary/Chest: Effort normal. No respiratory distress. She has no wheezes. She has no rales. She exhibits no tenderness.  Patient with decreased breath sounds bilateral bases.  Abdominal: Soft. Bowel sounds are normal. She exhibits no distension and no mass. There is no tenderness. There is no rebound and no guarding.  Musculoskeletal: Normal range of  motion. She exhibits no  edema and no tenderness.  No calf swelling or tenderness.  Neurological: She is alert and oriented to person, place, and time.  Patient is alert and oriented x3 with clear, goal oriented speech. Patient has 5/5 motor in all extremities. Sensation is intact to light touch.    Skin: Skin is warm and dry. No rash noted. No erythema.  Psychiatric: She has a normal mood and affect. Her behavior is normal.    ED Course  Procedures (including critical care time) Labs Review Labs Reviewed  CBC WITH DIFFERENTIAL - Abnormal; Notable for the following:    WBC 10.6 (*)    RBC 3.23 (*)    Hemoglobin 10.4 (*)    HCT 32.3 (*)    All other components within normal limits  COMPREHENSIVE METABOLIC PANEL  TROPONIN I  PRO B NATRIURETIC PEPTIDE   Imaging Review Dg Chest 2 View  07/06/2013   CLINICAL DATA:  Shortness of breath, cough  EXAM: CHEST  2 VIEW  COMPARISON:  February 25, 2012  FINDINGS: The heart size and mediastinal contours are stable. The heart size is enlarged. There is mild increased pulmonary interstitium bilaterally. There is no focal pneumonia or pleural effusion. The visualized skeletal structures are stable.  IMPRESSION: Mild increased pulmonary interstitium bilaterally favor bronchitis, less likely interstitial edema.   Electronically Signed   By: Abelardo Diesel M.D.   On: 07/06/2013 23:30    EKG Interpretation   None      Date: 07/07/2013  Rate: 77  Rhythm: normal sinus rhythm  QRS Axis: normal  Intervals: normal  ST/T Wave abnormalities: normal  Conduction Disutrbances:none  Narrative Interpretation:   Old EKG Reviewed: none available    MDM   Final diagnoses:  None    Question pulmonary edema versus bronchitis with bronchospasm. Will give trial nebulize treatments and reevaluate  Patient states she is feeling better after the breathing treatment. I suspect some element of bronchitis and bronchospasm in addition to pulmonary edema. She scheduled for dialysis this  morning. She is requesting to be discharged home. Her vital signs remained stable in the emergency department. Have given her an inhaler and we'll start her on a short course of steroids. Patient been advised to return immediately for worsening shortness of breath, fever, chest pain or for any concerns.  Julianne Rice, MD 07/07/13 [phone]

## 2013-07-07 NOTE — Discharge Instructions (Signed)
Get your dialysis today as previously scheduled. Take steroids as prescribed and used her inhaler as needed for shortness of breath. Return immediately for worsening shortness of breath, chest pain, fever or for any concerns.   Bronchitis Bronchitis is inflammation of the airways that extend from the windpipe into the lungs (bronchi). The inflammation often causes mucus to develop, which leads to a cough. If the inflammation becomes severe, it may cause shortness of breath. CAUSES  Bronchitis may be caused by:   Viral infections.   Bacteria.   Cigarette smoke.   Allergens, pollutants, and other irritants.  SIGNS AND SYMPTOMS  The most common symptom of bronchitis is a frequent cough that produces mucus. Other symptoms include:  Fever.   Body aches.   Chest congestion.   Chills.   Shortness of breath.   Sore throat.  DIAGNOSIS  Bronchitis is usually diagnosed through a medical history and physical exam. Tests, such as chest X-rays, are sometimes done to rule out other conditions.  TREATMENT  You may need to avoid contact with whatever caused the problem (smoking, for example). Medicines are sometimes needed. These may include:  Antibiotics. These may be prescribed if the condition is caused by bacteria.  Cough suppressants. These may be prescribed for relief of cough symptoms.   Inhaled medicines. These may be prescribed to help open your airways and make it easier for you to breathe.   Steroid medicines. These may be prescribed for those with recurrent (chronic) bronchitis. HOME CARE INSTRUCTIONS  Get plenty of rest.   Drink enough fluids to keep your urine clear or pale yellow (unless you have a medical condition that requires fluid restriction). Increasing fluids may help thin your secretions and will prevent dehydration.   Only take over-the-counter or prescription medicines as directed by your health care provider.  Only take antibiotics as directed.  Make sure you finish them even if you start to feel better.  Avoid secondhand smoke, irritating chemicals, and strong fumes. These will make bronchitis worse. If you are a smoker, quit smoking. Consider using nicotine gum or skin patches to help control withdrawal symptoms. Quitting smoking will help your lungs heal faster.   Put a cool-mist humidifier in your bedroom at night to moisten the air. This may help loosen mucus. Change the water in the humidifier daily. You can also run the hot water in your shower and sit in the bathroom with the door closed for 5 10 minutes.   Follow up with your health care provider as directed.   Wash your hands frequently to avoid catching bronchitis again or spreading an infection to others.  SEEK MEDICAL CARE IF: Your symptoms do not improve after 1 week of treatment.  SEEK IMMEDIATE MEDICAL CARE IF:  Your fever increases.  You have chills.   You have chest pain.   You have worsening shortness of breath.   You have bloody sputum.  You faint.  You have lightheadedness.  You have a severe headache.   You vomit repeatedly. MAKE SURE YOU:   Understand these instructions.  Will watch your condition.  Will get help right away if you are not doing well or get worse. Document Released: 05/13/2005 Document Revised: 03/03/2013 Document Reviewed: 01/05/2013 [facility] Patient Information 2014 [place].

## 2013-07-08 DIAGNOSIS — E1039 Type 1 diabetes mellitus with other diabetic ophthalmic complication: Secondary | ICD-10-CM | POA: Diagnosis not present

## 2013-07-08 DIAGNOSIS — E11359 Type 2 diabetes mellitus with proliferative diabetic retinopathy without macular edema: Secondary | ICD-10-CM | POA: Diagnosis not present

## 2013-07-08 DIAGNOSIS — E11311 Type 2 diabetes mellitus with unspecified diabetic retinopathy with macular edema: Secondary | ICD-10-CM | POA: Diagnosis not present

## 2013-07-09 DIAGNOSIS — D509 Iron deficiency anemia, unspecified: Secondary | ICD-10-CM | POA: Diagnosis not present

## 2013-07-09 DIAGNOSIS — N186 End stage renal disease: Secondary | ICD-10-CM | POA: Diagnosis not present

## 2013-07-09 DIAGNOSIS — N2581 Secondary hyperparathyroidism of renal origin: Secondary | ICD-10-CM | POA: Diagnosis not present

## 2013-07-09 DIAGNOSIS — D631 Anemia in chronic kidney disease: Secondary | ICD-10-CM | POA: Diagnosis not present

## 2013-07-12 DIAGNOSIS — D509 Iron deficiency anemia, unspecified: Secondary | ICD-10-CM | POA: Diagnosis not present

## 2013-07-12 DIAGNOSIS — D631 Anemia in chronic kidney disease: Secondary | ICD-10-CM | POA: Diagnosis not present

## 2013-07-12 DIAGNOSIS — N186 End stage renal disease: Secondary | ICD-10-CM | POA: Diagnosis not present

## 2013-07-12 DIAGNOSIS — N2581 Secondary hyperparathyroidism of renal origin: Secondary | ICD-10-CM | POA: Diagnosis not present

## 2013-07-14 DIAGNOSIS — D509 Iron deficiency anemia, unspecified: Secondary | ICD-10-CM | POA: Diagnosis not present

## 2013-07-14 DIAGNOSIS — N039 Chronic nephritic syndrome with unspecified morphologic changes: Secondary | ICD-10-CM | POA: Diagnosis not present

## 2013-07-14 DIAGNOSIS — N186 End stage renal disease: Secondary | ICD-10-CM | POA: Diagnosis not present

## 2013-07-14 DIAGNOSIS — N2581 Secondary hyperparathyroidism of renal origin: Secondary | ICD-10-CM | POA: Diagnosis not present

## 2013-07-14 DIAGNOSIS — D631 Anemia in chronic kidney disease: Secondary | ICD-10-CM | POA: Diagnosis not present

## 2013-07-16 DIAGNOSIS — N039 Chronic nephritic syndrome with unspecified morphologic changes: Secondary | ICD-10-CM | POA: Diagnosis not present

## 2013-07-16 DIAGNOSIS — D509 Iron deficiency anemia, unspecified: Secondary | ICD-10-CM | POA: Diagnosis not present

## 2013-07-16 DIAGNOSIS — N2581 Secondary hyperparathyroidism of renal origin: Secondary | ICD-10-CM | POA: Diagnosis not present

## 2013-07-16 DIAGNOSIS — D631 Anemia in chronic kidney disease: Secondary | ICD-10-CM | POA: Diagnosis not present

## 2013-07-16 DIAGNOSIS — N186 End stage renal disease: Secondary | ICD-10-CM | POA: Diagnosis not present

## 2013-07-19 DIAGNOSIS — N186 End stage renal disease: Secondary | ICD-10-CM | POA: Diagnosis not present

## 2013-07-19 DIAGNOSIS — D509 Iron deficiency anemia, unspecified: Secondary | ICD-10-CM | POA: Diagnosis not present

## 2013-07-19 DIAGNOSIS — D631 Anemia in chronic kidney disease: Secondary | ICD-10-CM | POA: Diagnosis not present

## 2013-07-19 DIAGNOSIS — N039 Chronic nephritic syndrome with unspecified morphologic changes: Secondary | ICD-10-CM | POA: Diagnosis not present

## 2013-07-19 DIAGNOSIS — N2581 Secondary hyperparathyroidism of renal origin: Secondary | ICD-10-CM | POA: Diagnosis not present

## 2013-07-21 DIAGNOSIS — N186 End stage renal disease: Secondary | ICD-10-CM | POA: Diagnosis not present

## 2013-07-21 DIAGNOSIS — N2581 Secondary hyperparathyroidism of renal origin: Secondary | ICD-10-CM | POA: Diagnosis not present

## 2013-07-21 DIAGNOSIS — D631 Anemia in chronic kidney disease: Secondary | ICD-10-CM | POA: Diagnosis not present

## 2013-07-21 DIAGNOSIS — D509 Iron deficiency anemia, unspecified: Secondary | ICD-10-CM | POA: Diagnosis not present

## 2013-07-23 DIAGNOSIS — D631 Anemia in chronic kidney disease: Secondary | ICD-10-CM | POA: Diagnosis not present

## 2013-07-23 DIAGNOSIS — N186 End stage renal disease: Secondary | ICD-10-CM | POA: Diagnosis not present

## 2013-07-23 DIAGNOSIS — D509 Iron deficiency anemia, unspecified: Secondary | ICD-10-CM | POA: Diagnosis not present

## 2013-07-23 DIAGNOSIS — N2581 Secondary hyperparathyroidism of renal origin: Secondary | ICD-10-CM | POA: Diagnosis not present

## 2013-07-24 DIAGNOSIS — N186 End stage renal disease: Secondary | ICD-10-CM | POA: Diagnosis not present

## 2013-07-26 DIAGNOSIS — N039 Chronic nephritic syndrome with unspecified morphologic changes: Secondary | ICD-10-CM | POA: Diagnosis not present

## 2013-07-26 DIAGNOSIS — N186 End stage renal disease: Secondary | ICD-10-CM | POA: Diagnosis not present

## 2013-07-26 DIAGNOSIS — D631 Anemia in chronic kidney disease: Secondary | ICD-10-CM | POA: Diagnosis not present

## 2013-07-26 DIAGNOSIS — N2581 Secondary hyperparathyroidism of renal origin: Secondary | ICD-10-CM | POA: Diagnosis not present

## 2013-07-26 DIAGNOSIS — D509 Iron deficiency anemia, unspecified: Secondary | ICD-10-CM | POA: Diagnosis not present

## 2013-08-10 DIAGNOSIS — M79609 Pain in unspecified limb: Secondary | ICD-10-CM | POA: Diagnosis not present

## 2013-08-10 DIAGNOSIS — A5211 Tabes dorsalis: Secondary | ICD-10-CM | POA: Diagnosis not present

## 2013-08-13 ENCOUNTER — Emergency Department (HOSPITAL_COMMUNITY): Payer: Medicare Other

## 2013-08-13 ENCOUNTER — Encounter (HOSPITAL_COMMUNITY): Payer: Self-pay | Admitting: Emergency Medicine

## 2013-08-13 ENCOUNTER — Emergency Department (HOSPITAL_COMMUNITY)
Admission: EM | Admit: 2013-08-13 | Discharge: 2013-08-13 | Disposition: A | Discharge status: Home / Self Care | Payer: Medicare Other | Attending: Emergency Medicine | Admitting: Emergency Medicine

## 2013-08-13 DIAGNOSIS — Z881 Allergy status to other antibiotic agents status: Secondary | ICD-10-CM | POA: Diagnosis not present

## 2013-08-13 DIAGNOSIS — J209 Acute bronchitis, unspecified: Secondary | ICD-10-CM | POA: Diagnosis not present

## 2013-08-13 DIAGNOSIS — K3184 Gastroparesis: Secondary | ICD-10-CM | POA: Insufficient documentation

## 2013-08-13 DIAGNOSIS — A5211 Tabes dorsalis: Secondary | ICD-10-CM | POA: Diagnosis not present

## 2013-08-13 DIAGNOSIS — N186 End stage renal disease: Secondary | ICD-10-CM | POA: Insufficient documentation

## 2013-08-13 DIAGNOSIS — Z872 Personal history of diseases of the skin and subcutaneous tissue: Secondary | ICD-10-CM | POA: Insufficient documentation

## 2013-08-13 DIAGNOSIS — E1149 Type 2 diabetes mellitus with other diabetic neurological complication: Secondary | ICD-10-CM | POA: Diagnosis not present

## 2013-08-13 DIAGNOSIS — Z794 Long term (current) use of insulin: Secondary | ICD-10-CM | POA: Diagnosis not present

## 2013-08-13 DIAGNOSIS — I120 Hypertensive chronic kidney disease with stage 5 chronic kidney disease or end stage renal disease: Secondary | ICD-10-CM | POA: Diagnosis not present

## 2013-08-13 DIAGNOSIS — G43909 Migraine, unspecified, not intractable, without status migrainosus: Secondary | ICD-10-CM | POA: Insufficient documentation

## 2013-08-13 DIAGNOSIS — G609 Hereditary and idiopathic neuropathy, unspecified: Secondary | ICD-10-CM | POA: Diagnosis not present

## 2013-08-13 DIAGNOSIS — R059 Cough, unspecified: Secondary | ICD-10-CM | POA: Diagnosis not present

## 2013-08-13 DIAGNOSIS — E785 Hyperlipidemia, unspecified: Secondary | ICD-10-CM | POA: Insufficient documentation

## 2013-08-13 DIAGNOSIS — R509 Fever, unspecified: Secondary | ICD-10-CM | POA: Diagnosis not present

## 2013-08-13 DIAGNOSIS — Z882 Allergy status to sulfonamides status: Secondary | ICD-10-CM | POA: Insufficient documentation

## 2013-08-13 DIAGNOSIS — D649 Anemia, unspecified: Secondary | ICD-10-CM | POA: Diagnosis not present

## 2013-08-13 DIAGNOSIS — Z87891 Personal history of nicotine dependence: Secondary | ICD-10-CM | POA: Insufficient documentation

## 2013-08-13 DIAGNOSIS — R0602 Shortness of breath: Secondary | ICD-10-CM | POA: Diagnosis not present

## 2013-08-13 DIAGNOSIS — I10 Essential (primary) hypertension: Secondary | ICD-10-CM | POA: Insufficient documentation

## 2013-08-13 DIAGNOSIS — Z7902 Long term (current) use of antithrombotics/antiplatelets: Secondary | ICD-10-CM | POA: Insufficient documentation

## 2013-08-13 DIAGNOSIS — Z1401 Asymptomatic hemophilia A carrier: Secondary | ICD-10-CM | POA: Insufficient documentation

## 2013-08-13 DIAGNOSIS — R6889 Other general symptoms and signs: Secondary | ICD-10-CM

## 2013-08-13 DIAGNOSIS — I2510 Atherosclerotic heart disease of native coronary artery without angina pectoris: Secondary | ICD-10-CM | POA: Insufficient documentation

## 2013-08-13 DIAGNOSIS — Z79899 Other long term (current) drug therapy: Secondary | ICD-10-CM | POA: Insufficient documentation

## 2013-08-13 DIAGNOSIS — I739 Peripheral vascular disease, unspecified: Secondary | ICD-10-CM | POA: Insufficient documentation

## 2013-08-13 DIAGNOSIS — R05 Cough: Secondary | ICD-10-CM | POA: Insufficient documentation

## 2013-08-13 DIAGNOSIS — D069 Carcinoma in situ of cervix, unspecified: Secondary | ICD-10-CM | POA: Insufficient documentation

## 2013-08-13 DIAGNOSIS — J111 Influenza due to unidentified influenza virus with other respiratory manifestations: Secondary | ICD-10-CM | POA: Diagnosis not present

## 2013-08-13 DIAGNOSIS — R062 Wheezing: Secondary | ICD-10-CM | POA: Insufficient documentation

## 2013-08-13 DIAGNOSIS — R112 Nausea with vomiting, unspecified: Secondary | ICD-10-CM | POA: Insufficient documentation

## 2013-08-13 LAB — CBC WITH DIFFERENTIAL/PLATELET
BASOS PCT: 0 % (ref 0–1)
Basophils Absolute: 0 10*3/uL (ref 0.0–0.1)
EOS ABS: 0.1 10*3/uL (ref 0.0–0.7)
Eosinophils Relative: 1 % (ref 0–5)
HCT: 41 % (ref 36.0–46.0)
Hemoglobin: 13.6 g/dL (ref 12.0–15.0)
Lymphocytes Relative: 5 % — ABNORMAL LOW (ref 12–46)
Lymphs Abs: 0.4 10*3/uL — ABNORMAL LOW (ref 0.7–4.0)
MCH: 32.5 pg (ref 26.0–34.0)
MCHC: 33.2 g/dL (ref 30.0–36.0)
MCV: 97.9 fL (ref 78.0–100.0)
Monocytes Absolute: 0.6 10*3/uL (ref 0.1–1.0)
Monocytes Relative: 10 % (ref 3–12)
NEUTROS PCT: 83 % — AB (ref 43–77)
Neutro Abs: 5.4 10*3/uL (ref 1.7–7.7)
PLATELETS: 183 10*3/uL (ref 150–400)
RBC: 4.19 MIL/uL (ref 3.87–5.11)
RDW: 12.4 % (ref 11.5–15.5)
WBC: 6.5 10*3/uL (ref 4.0–10.5)

## 2013-08-13 LAB — CBG MONITORING, ED: Glucose-Capillary: 208 mg/dL — ABNORMAL HIGH (ref 70–99)

## 2013-08-13 LAB — COMPREHENSIVE METABOLIC PANEL
ALBUMIN: 3.9 g/dL (ref 3.5–5.2)
ALK PHOS: 65 U/L (ref 39–117)
ALT: 30 U/L (ref 0–35)
AST: 19 U/L (ref 0–37)
BUN: 33 mg/dL — AB (ref 6–23)
CALCIUM: 9.5 mg/dL (ref 8.4–10.5)
CO2: 29 mEq/L (ref 19–32)
Chloride: 96 mEq/L (ref 96–112)
Creatinine, Ser: 4.85 mg/dL — ABNORMAL HIGH (ref 0.50–1.10)
GFR calc Af Amer: 12 mL/min — ABNORMAL LOW (ref >90)
GFR calc non Af Amer: 11 mL/min — ABNORMAL LOW (ref >90)
Glucose, Bld: 43 mg/dL — CL (ref 70–99)
POTASSIUM: 4.2 meq/L (ref 3.7–5.3)
SODIUM: 141 meq/L (ref 137–147)
TOTAL PROTEIN: 7.6 g/dL (ref 6.0–8.3)
Total Bilirubin: 0.4 mg/dL (ref 0.3–1.2)

## 2013-08-13 LAB — TROPONIN I

## 2013-08-13 MED ORDER — ALBUTEROL SULFATE HFA 108 (90 BASE) MCG/ACT IN AERS: 2.0000 | INHALATION_SPRAY | RESPIRATORY_TRACT | Status: DC | PRN

## 2013-08-13 MED ORDER — IPRATROPIUM-ALBUTEROL 0.5-2.5 (3) MG/3ML IN SOLN
3.0000 mL | Freq: Once | RESPIRATORY_TRACT | Status: AC
  Administered 2013-08-13: 3 mL via RESPIRATORY_TRACT
  Filled 2013-08-13: qty 3

## 2013-08-13 MED ORDER — PREDNISONE 20 MG PO TABS
60.0000 mg | ORAL_TABLET | Freq: Once | ORAL | Status: AC
  Administered 2013-08-13: 60 mg via ORAL
  Filled 2013-08-13: qty 3

## 2013-08-13 MED ORDER — DEXTROSE 50 % IV SOLN
1.0000 | Freq: Once | INTRAVENOUS | Status: AC
  Administered 2013-08-13: 50 mL via INTRAVENOUS
  Filled 2013-08-13: qty 50

## 2013-08-13 MED ORDER — ACETAMINOPHEN 325 MG PO TABS
650.0000 mg | ORAL_TABLET | Freq: Once | ORAL | Status: AC
  Administered 2013-08-13: 650 mg via ORAL
  Filled 2013-08-13: qty 2

## 2013-08-13 MED ORDER — PREDNISONE 20 MG PO TABS: 60.0000 mg | ORAL_TABLET | Freq: Every day | ORAL | Status: DC

## 2013-08-13 MED ORDER — ONDANSETRON HCL 4 MG/2ML IJ SOLN
4.0000 mg | Freq: Once | INTRAMUSCULAR | Status: AC
  Administered 2013-08-13: 4 mg via INTRAVENOUS
  Filled 2013-08-13: qty 2

## 2013-08-13 NOTE — ED Notes (Signed)
Dr. Dina Rich at the bedside.  Updated the patient on the plan of care.  Patient is to ambulate in the hallway while monitoring O2 saturation.

## 2013-08-13 NOTE — ED Notes (Signed)
Dialysis pt just dilayzed today.  Fistula lt wrist

## 2013-08-13 NOTE — ED Notes (Signed)
Updated patient on the plan of care, and will prepare for discharge per Dr. Dina Rich.

## 2013-08-13 NOTE — ED Notes (Signed)
Capillary blood glucose reading 208.

## 2013-08-13 NOTE — ED Notes (Signed)
The pt has had fever chills chest tightness  Coughing non-productive for 2 days.  She was seen at an urgent care today and sent here after a neg chest xray.  No blood work done there just a chest xray.  lmp march first

## 2013-08-13 NOTE — ED Notes (Signed)
Ambulated pt in the hallway PO2 98%

## 2013-08-13 NOTE — ED Notes (Signed)
The pt has had these symptoms in the past with a dx of  bronchitis

## 2013-08-13 NOTE — ED Provider Notes (Signed)
CSN: [id number]     Arrival date & time 08/13/13  1705 History   First MD Initiated Contact with Patient 08/13/13 1735     Chief Complaint  Patient presents with  . multiple symptoms      (Consider location/radiation/quality/duration/timing/severity/associated sxs/prior Treatment) HPI  This is a 38 year old female with history of diabetes, hypertension, hyperlipidemia, coronary artery disease, end-stage renal disease on dialysis Monday Wednesday Friday who presents with cough, shortness of breath, and fever. Patient reports 2 days of symptoms. She reports fever of 100.8. Patient has had cough that has been nonproductive and shortness of breath. The patient is a former smoker. Nausea also reports vomiting and diarrhea. Was seen in urgent care and had a chest x-ray. Did have a full dialysis session today. Noted to have an O2 sat of 91% in triage. She is placed on supplemental oxygen. She denies any chest pain.  Past Medical History  Diagnosis Date  . DM (diabetes mellitus)     Long-term insulin  . Hypertension   . Tobacco use disorder     Discontinued March 2012  . Hyperlipidemia     Hypertriglyceridemia 449 HDL 25  . Diabetic Charcot's joint disease   . Coronary artery disease     Status post cardiac catheterization June 2012 scattered coronary artery disease/atherosclerosis with 70-80% stenosis in a small right PDA.  . Gastroparesis   . Peripheral vascular disease     Tibial occlusive disease evaluated by Dr. Kellie Simmering in August 2011. Medical therapy  . 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 CIN III (cervical intraepithelial neoplasia grade III) with severe dysplasia     S/P LEEP AND [facility]  . Chronic anemia     2nd to renal disease  . End stage renal disease on dialysis 05/02/11    [facility]; M; W, F; last time 05/01/11  . Migraines     "just on dialysis days"  . Peripheral neuropathy     related to DM  . History of abscesses in groins 12/06/2010   Past Surgical History  Procedure Laterality  Date  . Av fistula placement  04/2010  . Dilation and curettage of uterus  2009  . Cervical biopsy  w/ loop electrode excision      h/o  . Cervical cone biopsy      h/o  . Colposcopy     Family History  Problem Relation Age of Onset  . Diabetes Mother   . Diabetes Father   . Diabetes Sister   . Breast cancer Maternal Aunt     Age 52's   History  Substance Use Topics  . Smoking status: Former Smoker -- 0.30 packs/day for 10 years    Types: Cigarettes    Quit date: 08/05/2011  . Smokeless tobacco: Never Used     Comment: "quit smoking cigarettes 07/2010"  . Alcohol Use: No   OB History   Grav Para Term Preterm Abortions TAB SAB Ect Mult Living   2 1 1  1     1      Review of Systems  Constitutional: Positive for fever.  Respiratory: Positive for cough and shortness of breath. Negative for chest tightness.   Cardiovascular: Negative for chest pain.  Gastrointestinal: Positive for nausea and vomiting. Negative for abdominal pain and diarrhea.  Genitourinary: Negative for dysuria.  Musculoskeletal: Negative for back pain.  Skin: Negative for rash.  Neurological: Negative for headaches.  Psychiatric/Behavioral: Negative for confusion.  All other systems reviewed and are negative.  Allergies  Cephalexin and Sulfamethoxazole-trimethoprim  Home Medications   Current Outpatient Rx  Name  Route  Sig  Dispense  Refill  . amLODipine (NORVASC) 10 MG tablet   Oral   Take 1 tablet (10 mg total) by mouth daily.   30 tablet   0     .Marland KitchenPatient needs to contact office to schedule  App ...   . atorvastatin (LIPITOR) 80 MG tablet   Oral   Take 1 tablet (80 mg total) by mouth every evening.   30 tablet   3   . calcium acetate (PHOSLO) 667 MG capsule   Oral   Take 2,001 mg by mouth 3 (three) times daily with meals.         . cloNIDine (CATAPRES) 0.1 MG tablet   Oral   Take 0.1 mg by mouth 2 (two) times daily.         . clopidogrel (PLAVIX) 75 MG tablet    Oral   Take 75 mg by mouth daily with breakfast.         . FOSRENOL 1000 MG chewable tablet   Oral   Chew 2-3 tablets by mouth 3 (three) times daily.         Marland Kitchen gabapentin (NEURONTIN) 300 MG capsule   Oral   Take 1 capsule by mouth daily as needed (for pain).          Marland Kitchen ibuprofen (ADVIL,MOTRIN) 200 MG tablet   Oral   Take 600 mg by mouth every 8 (eight) hours as needed. For pain.         Marland Kitchen insulin glargine (LANTUS) 100 UNIT/ML injection   Subcutaneous   Inject 26-28 Units into the skin 2 (two) times daily. 28 units in the am and 26 units in the pm         . insulin lispro (HUMALOG) 100 UNIT/ML injection   Subcutaneous   Inject 6-8 Units into the skin 3 (three) times daily before meals. Per sliding scale         . isosorbide mononitrate (IMDUR) 120 MG 24 hr tablet   Oral   Take 120 mg by mouth daily.         Marland Kitchen labetalol (NORMODYNE) 300 MG tablet   Oral   Take 1 tablet (300 mg total) by mouth 3 (three) times daily.   270 tablet   3   . nitroGLYCERIN (NITROSTAT) 0.4 MG SL tablet   Sublingual   Place 0.4 mg under the tongue every 5 (five) minutes as needed. For chest pain         . RENVELA 800 MG tablet   Oral   Take 1 tablet by mouth 4 (four) times daily - after meals and at bedtime.         Marland Kitchen albuterol (PROVENTIL HFA;VENTOLIN HFA) 108 (90 BASE) MCG/ACT inhaler   Inhalation   Inhale 2 puffs into the lungs every 4 (four) hours as needed for wheezing or shortness of breath.   1 Inhaler   0   . predniSONE (DELTASONE) 20 MG tablet   Oral   Take 3 tablets (60 mg total) by mouth daily with breakfast.   12 tablet   0    BP 120/77  Pulse 82  Temp(Src) 98.3 F (36.8 C) (Oral)  Resp 21  Ht 6\' 4"  (1.93 m)  Wt 230 lb (104.327 kg)  BMI 28.01 kg/m2  SpO2 98%  LMP 08/02/2013 Physical Exam  Nursing note and vitals reviewed. Constitutional: She is  oriented to person, place, and time. No distress.  Ill-appearing, nontoxic  HENT:  Head: Normocephalic  and atraumatic.  Because membranes dry  Eyes: Pupils are equal, round, and reactive to light.  Neck: Neck supple.  Cardiovascular: Normal rate, regular rhythm and normal heart sounds.   No murmur heard. Pulmonary/Chest: Effort normal. No respiratory distress. She has wheezes.  Scant expiratory wheezing  Abdominal: Soft. Bowel sounds are normal. There is no tenderness. There is no rebound.  Neurological: She is alert and oriented to person, place, and time.  Skin: Skin is warm and dry.  Fistula in left upper extremity  Psychiatric: She has a normal mood and affect.    ED Course  Procedures (including critical care time) Labs Review Labs Reviewed  CBC WITH DIFFERENTIAL - Abnormal; Notable for the following:    Neutrophils Relative % 83 (*)    Lymphocytes Relative 5 (*)    Lymphs Abs 0.4 (*)    All other components within normal limits  COMPREHENSIVE METABOLIC PANEL - Abnormal; Notable for the following:    Glucose, Bld 43 (*)    BUN 33 (*)    Creatinine, Ser 4.85 (*)    GFR calc non Af Amer 11 (*)    GFR calc Af Amer 12 (*)    All other components within normal limits  CBG MONITORING, ED - Abnormal; Notable for the following:    Glucose-Capillary 208 (*)    All other components within normal limits  TROPONIN I   Imaging Review Dg Chest 2 View  08/13/2013   CLINICAL DATA:  Shortness of breath.  EXAM: CHEST  2 VIEW  COMPARISON:  July 06, 2013.  FINDINGS: Stable cardiomediastinal silhouette. No pneumothorax or pleural effusion is noted. No acute pulmonary disease is noted. Bony thorax is intact.  IMPRESSION: No acute cardiopulmonary abnormality seen.   Electronically Signed   By: Sabino Dick M.D.   On: 08/13/2013 19:28     EKG Interpretation   Date/Time:  Friday August 13 2013 17:12:52 EDT Ventricular Rate:  91 PR Interval:  134 QRS Duration: 98 QT Interval:  394 QTC Calculation: 484 R Axis:   24 Text Interpretation:  Normal sinus rhythm Left ventricular hypertrophy   with repolarization abnormality Cannot rule out Septal infarct , age  undetermined Abnormal ECG Deeper T wave inversions in I/avL compared to  prior, new T wave inversions V4-5 Reconfirmed by Andrea Colglazier  MD, Taji Barretto  [phone]) on 08/13/2013 9:54:52 PM      MDM   Final diagnoses:  Bronchitis with bronchospasm  Flu-like symptoms    Patient presents with flulike symptoms, fever, and shortness of breath. She is nontoxic on exam.  She is afebrile here and vital signs are reassuring. Work up is notable for glucose of 43. Patient states that she took her insulin this morning but has not eaten much. She was given an amp of D50. Repeat glucose 208 and patient is tolerating by mouth. Chest x-ray shows no evidence of pneumonia. Troponin is negative. EKG is reassuring. Patient has evidence of wheezing on exam. She was given a duo neb and prednisone. She continues to wheeze. Patient was given a second duo neb. Patient reports improvement of symptoms with duo neb. Oxygen was removed and patient maintained oxygen saturations on room air. She ambulated and maintained oxygen saturations of 98%. Given patient's extensive medical history, I have offered patient Tamiflu given fevers or flulike symptoms.  She declined. She will followup with her primary Dr.  After history, exam,  and medical workup I feel the patient has been appropriately medically screened and is safe for discharge home. Pertinent diagnoses were discussed with the patient. Patient was given return precautions.    [name], MD 08/13/13 2239

## 2013-08-13 NOTE — ED Notes (Signed)
sats low at triage time.  Nasal 02 brought 02 sats to a 97

## 2013-08-13 NOTE — Discharge Instructions (Signed)
Acute Bronchitis Bronchitis is inflammation of the airways that extend from the windpipe into the lungs (bronchi). The inflammation often causes mucus to develop. This leads to a cough, which is the most common symptom of bronchitis.  In acute bronchitis, the condition usually develops suddenly and goes away over time, usually in a couple weeks. Smoking, allergies, and asthma can make bronchitis worse. Repeated episodes of bronchitis may cause further lung problems.  CAUSES Acute bronchitis is most often caused by the same virus that causes a cold. The virus can spread from person to person (contagious).  SIGNS AND SYMPTOMS   Cough.   Fever.   Coughing up mucus.   Body aches.   Chest congestion.   Chills.   Shortness of breath.   Sore throat.  DIAGNOSIS  Acute bronchitis is usually diagnosed through a physical exam. Tests, such as chest X-rays, are sometimes done to rule out other conditions.  TREATMENT  Acute bronchitis usually goes away in a couple weeks. Often times, no medical treatment is necessary. Medicines are sometimes given for relief of fever or cough. Antibiotics are usually not needed but may be prescribed in certain situations. In some cases, an inhaler may be recommended to help reduce shortness of breath and control the cough. A cool mist vaporizer may also be used to help thin bronchial secretions and make it easier to clear the chest.  HOME CARE INSTRUCTIONS  Get plenty of rest.   Drink enough fluids to keep your urine clear or pale yellow (unless you have a medical condition that requires fluid restriction). Increasing fluids may help thin your secretions and will prevent dehydration.   Only take over-the-counter or prescription medicines as directed by your health care provider.   Avoid smoking and secondhand smoke. Exposure to cigarette smoke or irritating chemicals will make bronchitis worse. If you are a smoker, consider using nicotine gum or skin  patches to help control withdrawal symptoms. Quitting smoking will help your lungs heal faster.   Reduce the chances of another bout of acute bronchitis by washing your hands frequently, avoiding people with cold symptoms, and trying not to touch your hands to your mouth, nose, or eyes.   Follow up with your health care provider as directed.  SEEK MEDICAL CARE IF: Your symptoms do not improve after 1 week of treatment.  SEEK IMMEDIATE MEDICAL CARE IF:  You develop an increased fever or chills.   You have chest pain.   You have severe shortness of breath.  You have bloody sputum.   You develop dehydration.  You develop fainting.  You develop repeated vomiting.  You develop a severe headache. MAKE SURE YOU:   Understand these instructions.  Will watch your condition.  Will get help right away if you are not doing well or get worse. Document Released: 06/20/2004 Document Revised: 01/13/2013 Document Reviewed: 11/03/2012 [facility] Patient Information 2014 [place]. Viral Infections A viral infection can be caused by different types of viruses.Most viral infections are not serious and resolve on their own. However, some infections may cause severe symptoms and may lead to further complications. SYMPTOMS Viruses can frequently cause:  Minor sore throat.  Aches and pains.  Headaches.  Runny nose.  Different types of rashes.  Watery eyes.  Tiredness.  Cough.  Loss of appetite.  Gastrointestinal infections, resulting in nausea, vomiting, and diarrhea. These symptoms do not respond to antibiotics because the infection is not caused by bacteria. However, you might catch a bacterial infection following the viral  infection. This is sometimes called a "superinfection." Symptoms of such a bacterial infection may include:  Worsening sore throat with pus and difficulty swallowing.  Swollen neck glands.  Chills and a high or persistent fever.  Severe  headache.  Tenderness over the sinuses.  Persistent overall ill feeling (malaise), muscle aches, and tiredness (fatigue).  Persistent cough.  Yellow, green, or brown mucus production with coughing. HOME CARE INSTRUCTIONS   Only take over-the-counter or prescription medicines for pain, discomfort, diarrhea, or fever as directed by your caregiver.  Drink enough water and fluids to keep your urine clear or pale yellow. Sports drinks can provide valuable electrolytes, sugars, and hydration.  Get plenty of rest and maintain proper nutrition. Soups and broths with crackers or rice are fine. SEEK IMMEDIATE MEDICAL CARE IF:   You have severe headaches, shortness of breath, chest pain, neck pain, or an unusual rash.  You have uncontrolled vomiting, diarrhea, or you are unable to keep down fluids.  You or your child has an oral temperature above 102 F (38.9 C), not controlled by medicine.  Your baby is older than 3 months with a rectal temperature of 102 F (38.9 C) or higher.  Your baby is 9 months old or younger with a rectal temperature of 100.4 F (38 C) or higher. MAKE SURE YOU:   Understand these instructions.  Will watch your condition.  Will get help right away if you are not doing well or get worse. Document Released: 02/20/2005 Document Revised: 08/05/2011 Document Reviewed: 09/17/2010 [facility] Patient Information 2014 [place], [place].

## 2013-08-13 NOTE — ED Notes (Signed)
Informed Dr. Dina Rich of repeat EKG completed.

## 2013-08-24 DIAGNOSIS — N186 End stage renal disease: Secondary | ICD-10-CM | POA: Diagnosis not present

## 2013-08-25 DIAGNOSIS — N186 End stage renal disease: Secondary | ICD-10-CM | POA: Diagnosis not present

## 2013-08-25 DIAGNOSIS — D631 Anemia in chronic kidney disease: Secondary | ICD-10-CM | POA: Diagnosis not present

## 2013-08-25 DIAGNOSIS — N2581 Secondary hyperparathyroidism of renal origin: Secondary | ICD-10-CM | POA: Diagnosis not present

## 2013-08-25 DIAGNOSIS — E1129 Type 2 diabetes mellitus with other diabetic kidney complication: Secondary | ICD-10-CM | POA: Diagnosis not present

## 2013-09-07 ENCOUNTER — Telehealth: Payer: Self-pay

## 2013-09-07 NOTE — Telephone Encounter (Signed)
We can fill this one more time.  However, please document that this is the last time we will fill the prescriptions unless she presents for follow up.  Schedule follow up on a PA/NP schedule.

## 2013-09-08 ENCOUNTER — Telehealth: Payer: Self-pay

## 2013-09-08 ENCOUNTER — Other Ambulatory Visit: Payer: Self-pay

## 2013-09-08 NOTE — Telephone Encounter (Signed)
I called patient about making  Appointment, patient states that she is going to [facility] and I asked her to get [facility] to send her rx to them. Patient is no longer using Dr Percival Spanish as a MD

## 2013-09-08 NOTE — Telephone Encounter (Signed)
i called the patient about an appointment , she states that she is using [facility]

## 2013-09-09 DIAGNOSIS — E11311 Type 2 diabetes mellitus with unspecified diabetic retinopathy with macular edema: Secondary | ICD-10-CM | POA: Diagnosis not present

## 2013-09-09 DIAGNOSIS — E1039 Type 1 diabetes mellitus with other diabetic ophthalmic complication: Secondary | ICD-10-CM | POA: Diagnosis not present

## 2013-09-09 DIAGNOSIS — E1139 Type 2 diabetes mellitus with other diabetic ophthalmic complication: Secondary | ICD-10-CM | POA: Diagnosis not present

## 2013-09-09 DIAGNOSIS — H4310 Vitreous hemorrhage, unspecified eye: Secondary | ICD-10-CM | POA: Diagnosis not present

## 2013-09-09 DIAGNOSIS — E11359 Type 2 diabetes mellitus with proliferative diabetic retinopathy without macular edema: Secondary | ICD-10-CM | POA: Diagnosis not present

## 2013-09-09 DIAGNOSIS — H35359 Cystoid macular degeneration, unspecified eye: Secondary | ICD-10-CM | POA: Diagnosis not present

## 2013-09-15 DIAGNOSIS — E1129 Type 2 diabetes mellitus with other diabetic kidney complication: Secondary | ICD-10-CM | POA: Diagnosis not present

## 2013-09-15 DIAGNOSIS — N186 End stage renal disease: Secondary | ICD-10-CM | POA: Diagnosis not present

## 2013-09-21 DIAGNOSIS — I10 Essential (primary) hypertension: Secondary | ICD-10-CM | POA: Diagnosis not present

## 2013-09-21 DIAGNOSIS — E782 Mixed hyperlipidemia: Secondary | ICD-10-CM | POA: Diagnosis not present

## 2013-09-21 DIAGNOSIS — E1129 Type 2 diabetes mellitus with other diabetic kidney complication: Secondary | ICD-10-CM | POA: Diagnosis not present

## 2013-09-21 DIAGNOSIS — E109 Type 1 diabetes mellitus without complications: Secondary | ICD-10-CM | POA: Diagnosis not present

## 2013-09-23 DIAGNOSIS — N186 End stage renal disease: Secondary | ICD-10-CM | POA: Diagnosis not present

## 2013-09-24 DIAGNOSIS — E1129 Type 2 diabetes mellitus with other diabetic kidney complication: Secondary | ICD-10-CM | POA: Diagnosis not present

## 2013-09-24 DIAGNOSIS — N186 End stage renal disease: Secondary | ICD-10-CM | POA: Diagnosis not present

## 2013-09-24 DIAGNOSIS — N2581 Secondary hyperparathyroidism of renal origin: Secondary | ICD-10-CM | POA: Diagnosis not present

## 2013-09-24 DIAGNOSIS — D631 Anemia in chronic kidney disease: Secondary | ICD-10-CM | POA: Diagnosis not present

## 2013-09-28 DIAGNOSIS — I2510 Atherosclerotic heart disease of native coronary artery without angina pectoris: Secondary | ICD-10-CM | POA: Diagnosis not present

## 2013-09-28 DIAGNOSIS — Z01810 Encounter for preprocedural cardiovascular examination: Secondary | ICD-10-CM | POA: Diagnosis not present

## 2013-09-28 DIAGNOSIS — I10 Essential (primary) hypertension: Secondary | ICD-10-CM | POA: Diagnosis not present

## 2013-09-30 DIAGNOSIS — H66019 Acute suppurative otitis media with spontaneous rupture of ear drum, unspecified ear: Secondary | ICD-10-CM | POA: Diagnosis not present

## 2013-10-06 DIAGNOSIS — I120 Hypertensive chronic kidney disease with stage 5 chronic kidney disease or end stage renal disease: Secondary | ICD-10-CM | POA: Diagnosis not present

## 2013-10-06 DIAGNOSIS — R0602 Shortness of breath: Secondary | ICD-10-CM | POA: Diagnosis not present

## 2013-10-06 DIAGNOSIS — E11319 Type 2 diabetes mellitus with unspecified diabetic retinopathy without macular edema: Secondary | ICD-10-CM | POA: Diagnosis not present

## 2013-10-06 DIAGNOSIS — E1139 Type 2 diabetes mellitus with other diabetic ophthalmic complication: Secondary | ICD-10-CM | POA: Diagnosis not present

## 2013-10-06 DIAGNOSIS — H4600 Optic papillitis, unspecified eye: Secondary | ICD-10-CM | POA: Diagnosis not present

## 2013-10-06 DIAGNOSIS — E11311 Type 2 diabetes mellitus with unspecified diabetic retinopathy with macular edema: Secondary | ICD-10-CM | POA: Diagnosis not present

## 2013-10-06 DIAGNOSIS — Z992 Dependence on renal dialysis: Secondary | ICD-10-CM | POA: Diagnosis not present

## 2013-10-06 DIAGNOSIS — I2510 Atherosclerotic heart disease of native coronary artery without angina pectoris: Secondary | ICD-10-CM | POA: Diagnosis not present

## 2013-10-06 DIAGNOSIS — N186 End stage renal disease: Secondary | ICD-10-CM | POA: Diagnosis not present

## 2013-10-06 DIAGNOSIS — Z87891 Personal history of nicotine dependence: Secondary | ICD-10-CM | POA: Diagnosis not present

## 2013-10-06 DIAGNOSIS — H4310 Vitreous hemorrhage, unspecified eye: Secondary | ICD-10-CM | POA: Diagnosis not present

## 2013-10-06 DIAGNOSIS — D509 Iron deficiency anemia, unspecified: Secondary | ICD-10-CM | POA: Diagnosis not present

## 2013-10-06 DIAGNOSIS — H35379 Puckering of macula, unspecified eye: Secondary | ICD-10-CM | POA: Diagnosis not present

## 2013-10-06 DIAGNOSIS — I739 Peripheral vascular disease, unspecified: Secondary | ICD-10-CM | POA: Diagnosis not present

## 2013-10-12 DIAGNOSIS — J40 Bronchitis, not specified as acute or chronic: Secondary | ICD-10-CM | POA: Diagnosis not present

## 2013-10-14 DIAGNOSIS — E109 Type 1 diabetes mellitus without complications: Secondary | ICD-10-CM | POA: Diagnosis not present

## 2013-10-14 DIAGNOSIS — I10 Essential (primary) hypertension: Secondary | ICD-10-CM | POA: Diagnosis not present

## 2013-10-14 DIAGNOSIS — E782 Mixed hyperlipidemia: Secondary | ICD-10-CM | POA: Diagnosis not present

## 2013-10-19 DIAGNOSIS — H6520 Chronic serous otitis media, unspecified ear: Secondary | ICD-10-CM | POA: Diagnosis not present

## 2013-10-24 DIAGNOSIS — N186 End stage renal disease: Secondary | ICD-10-CM | POA: Diagnosis not present

## 2013-10-25 DIAGNOSIS — D631 Anemia in chronic kidney disease: Secondary | ICD-10-CM | POA: Diagnosis not present

## 2013-10-25 DIAGNOSIS — N2581 Secondary hyperparathyroidism of renal origin: Secondary | ICD-10-CM | POA: Diagnosis not present

## 2013-10-25 DIAGNOSIS — E1129 Type 2 diabetes mellitus with other diabetic kidney complication: Secondary | ICD-10-CM | POA: Diagnosis not present

## 2013-10-25 DIAGNOSIS — N186 End stage renal disease: Secondary | ICD-10-CM | POA: Diagnosis not present

## 2013-11-02 DIAGNOSIS — Z01810 Encounter for preprocedural cardiovascular examination: Secondary | ICD-10-CM | POA: Diagnosis not present

## 2013-11-02 DIAGNOSIS — N186 End stage renal disease: Secondary | ICD-10-CM | POA: Diagnosis not present

## 2013-11-02 DIAGNOSIS — I499 Cardiac arrhythmia, unspecified: Secondary | ICD-10-CM | POA: Diagnosis not present

## 2013-11-02 DIAGNOSIS — I517 Cardiomegaly: Secondary | ICD-10-CM | POA: Diagnosis not present

## 2013-11-02 DIAGNOSIS — I2510 Atherosclerotic heart disease of native coronary artery without angina pectoris: Secondary | ICD-10-CM | POA: Diagnosis not present

## 2013-11-02 DIAGNOSIS — Z992 Dependence on renal dialysis: Secondary | ICD-10-CM | POA: Diagnosis not present

## 2013-11-09 DIAGNOSIS — E1149 Type 2 diabetes mellitus with other diabetic neurological complication: Secondary | ICD-10-CM | POA: Diagnosis not present

## 2013-11-09 DIAGNOSIS — A5211 Tabes dorsalis: Secondary | ICD-10-CM | POA: Diagnosis not present

## 2013-11-11 DIAGNOSIS — E782 Mixed hyperlipidemia: Secondary | ICD-10-CM | POA: Diagnosis not present

## 2013-11-11 DIAGNOSIS — E109 Type 1 diabetes mellitus without complications: Secondary | ICD-10-CM | POA: Diagnosis not present

## 2013-11-11 DIAGNOSIS — E1129 Type 2 diabetes mellitus with other diabetic kidney complication: Secondary | ICD-10-CM | POA: Diagnosis not present

## 2013-11-23 DIAGNOSIS — N186 End stage renal disease: Secondary | ICD-10-CM | POA: Diagnosis not present

## 2013-11-24 DIAGNOSIS — N186 End stage renal disease: Secondary | ICD-10-CM | POA: Diagnosis not present

## 2013-11-24 DIAGNOSIS — E1129 Type 2 diabetes mellitus with other diabetic kidney complication: Secondary | ICD-10-CM | POA: Diagnosis not present

## 2013-11-24 DIAGNOSIS — D631 Anemia in chronic kidney disease: Secondary | ICD-10-CM | POA: Diagnosis not present

## 2013-11-24 DIAGNOSIS — N2581 Secondary hyperparathyroidism of renal origin: Secondary | ICD-10-CM | POA: Diagnosis not present

## 2013-11-24 DIAGNOSIS — D509 Iron deficiency anemia, unspecified: Secondary | ICD-10-CM | POA: Diagnosis not present

## 2013-11-26 DIAGNOSIS — N2581 Secondary hyperparathyroidism of renal origin: Secondary | ICD-10-CM | POA: Diagnosis not present

## 2013-11-26 DIAGNOSIS — N186 End stage renal disease: Secondary | ICD-10-CM | POA: Diagnosis not present

## 2013-11-30 DIAGNOSIS — N186 End stage renal disease: Secondary | ICD-10-CM | POA: Diagnosis not present

## 2013-12-15 DIAGNOSIS — N186 End stage renal disease: Secondary | ICD-10-CM | POA: Diagnosis not present

## 2013-12-15 DIAGNOSIS — E1129 Type 2 diabetes mellitus with other diabetic kidney complication: Secondary | ICD-10-CM | POA: Diagnosis not present

## 2013-12-16 DIAGNOSIS — Z01818 Encounter for other preprocedural examination: Secondary | ICD-10-CM | POA: Diagnosis not present

## 2013-12-16 DIAGNOSIS — N186 End stage renal disease: Secondary | ICD-10-CM | POA: Diagnosis not present

## 2013-12-23 DIAGNOSIS — E782 Mixed hyperlipidemia: Secondary | ICD-10-CM | POA: Diagnosis not present

## 2013-12-23 DIAGNOSIS — N186 End stage renal disease: Secondary | ICD-10-CM | POA: Diagnosis not present

## 2013-12-23 DIAGNOSIS — E109 Type 1 diabetes mellitus without complications: Secondary | ICD-10-CM | POA: Diagnosis not present

## 2013-12-24 DIAGNOSIS — N186 End stage renal disease: Secondary | ICD-10-CM | POA: Diagnosis not present

## 2013-12-27 DIAGNOSIS — E1129 Type 2 diabetes mellitus with other diabetic kidney complication: Secondary | ICD-10-CM | POA: Diagnosis not present

## 2013-12-27 DIAGNOSIS — D509 Iron deficiency anemia, unspecified: Secondary | ICD-10-CM | POA: Diagnosis not present

## 2013-12-27 DIAGNOSIS — D631 Anemia in chronic kidney disease: Secondary | ICD-10-CM | POA: Diagnosis not present

## 2013-12-27 DIAGNOSIS — N186 End stage renal disease: Secondary | ICD-10-CM | POA: Diagnosis not present

## 2013-12-27 DIAGNOSIS — N2581 Secondary hyperparathyroidism of renal origin: Secondary | ICD-10-CM | POA: Diagnosis not present

## 2013-12-31 DIAGNOSIS — N186 End stage renal disease: Secondary | ICD-10-CM | POA: Diagnosis not present

## 2014-01-04 ENCOUNTER — Other Ambulatory Visit (HOSPITAL_COMMUNITY)
Admission: RE | Admit: 2014-01-04 | Discharge: 2014-01-04 | Disposition: A | Discharge status: Home / Self Care | Payer: Medicare Other | Source: Ambulatory Visit | Attending: Gynecology | Admitting: Gynecology

## 2014-01-04 ENCOUNTER — Encounter: Payer: Self-pay | Admitting: Gynecology

## 2014-01-04 ENCOUNTER — Ambulatory Visit (INDEPENDENT_AMBULATORY_CARE_PROVIDER_SITE_OTHER): Payer: Medicare Other | Admitting: Gynecology

## 2014-01-04 VITALS — BP 124/70 | Ht 76.0 in | Wt 240.0 lb

## 2014-01-04 DIAGNOSIS — N186 End stage renal disease: Secondary | ICD-10-CM | POA: Diagnosis not present

## 2014-01-04 DIAGNOSIS — N946 Dysmenorrhea, unspecified: Secondary | ICD-10-CM | POA: Diagnosis not present

## 2014-01-04 DIAGNOSIS — Z87410 Personal history of cervical dysplasia: Secondary | ICD-10-CM | POA: Diagnosis not present

## 2014-01-04 DIAGNOSIS — Z1151 Encounter for screening for human papillomavirus (HPV): Secondary | ICD-10-CM | POA: Insufficient documentation

## 2014-01-04 DIAGNOSIS — Z124 Encounter for screening for malignant neoplasm of cervix: Secondary | ICD-10-CM | POA: Diagnosis not present

## 2014-01-04 DIAGNOSIS — N920 Excessive and frequent menstruation with regular cycle: Secondary | ICD-10-CM | POA: Diagnosis not present

## 2014-01-04 NOTE — Progress Notes (Signed)
[name] [birth year] [id number]   History:    38 y.o.  for annual gyn exam who has been complaining of dysmenorrhea and menorrhagia. Patient using condoms for contraception. Patient has history of end-stage renal disease dialysis dependent. She is being followed by Arbie Cookey in the kidney specialist Dr. 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 Her PCP has been doing her blood work. Review of her records indicated that back in 2000 she had LEEP cervical conization for CIN-3. Her Pap smears have been normal since then.  Past medical history,surgical history, family history and social history were all reviewed and documented in the EPIC chart.  Gynecologic History Patient's last menstrual period was 12/14/2013. Contraception: condoms Last Pap: 2013. Results were: normal Last mammogram: Not indicated. Results were: Not indicated  Obstetric History OB History  Gravida Para Term Preterm AB SAB TAB Ectopic Multiple Living  2 1 1  1     1     # Outcome Date GA Lbr Len/2nd Weight Sex Delivery Anes PTL Lv  2 ABT           1 TRM                ROS: A ROS was performed and pertinent positives and negatives are included in the history.  GENERAL: No fevers or chills. HEENT: No change in vision, no earache, sore throat or sinus congestion. NECK: No pain or stiffness. CARDIOVASCULAR: No chest pain or pressure. No palpitations. PULMONARY: No shortness of breath, cough or wheeze. GASTROINTESTINAL: No abdominal pain, nausea, vomiting or diarrhea, melena or bright red blood per rectum. GENITOURINARY: No urinary frequency, urgency, hesitancy or dysuria. MUSCULOSKELETAL: No joint or muscle pain, no back pain, no recent trauma. DERMATOLOGIC: No rash, no itching, no lesions. ENDOCRINE: No polyuria, polydipsia, no heat or cold intolerance. No recent change in weight. HEMATOLOGICAL: No anemia or easy bruising or bleeding. NEUROLOGIC: No headache, seizures, numbness, tingling or weakness. PSYCHIATRIC:  No depression, no loss of interest in normal activity or change in sleep pattern.     Exam: chaperone present  BP 124/70  Ht 6\' 4"  (1.93 m)  Wt 240 lb (108.863 kg)  BMI 29.23 kg/m2  LMP 12/14/2013  Body mass index is 29.23 kg/(m^2).  General appearance : Well developed well nourished female. No acute distress HEENT: Neck supple, trachea midline, no carotid bruits, no thyroidmegaly Lungs: Clear to auscultation, no rhonchi or wheezes, or rib retractions  Heart: Regular rate and rhythm, no murmurs or gallops Breast:Examined in sitting and supine position were symmetrical in appearance, no palpable masses or tenderness,  no skin retraction, no nipple inversion, no nipple discharge, no skin discoloration, no axillary or supraclavicular lymphadenopathy Abdomen: no palpable masses or tenderness, no rebound or guarding Extremities: no edema or skin discoloration or tenderness  Pelvic:  Bartholin, Urethra, Skene Glands: Within normal limits             Vagina: No gross lesions or discharge  Cervix: No gross lesions or discharge  Uterus  anteverted, normal size, shape and consistency, non-tender and mobile  Adnexa  Without masses or tenderness  Anus and perineum  normal   Rectovaginal  normal sphincter tone without palpated masses or tenderness             Hemoccult not indicated     Assessment/Plan:  38 y.o. female for annual exam who suffers from dysmenorrhea and menorrhagia and using condoms for contraception. We discussed and literature was provided on the Mirena  IUD which she is very interested. We'll schedule to place it sometimes with her next cycle. She was reminded of the importance of monthly breast exam. Her Pap smear was done today we'll continue to do one yearly due to the fact that she had history of CIN-3 cervical conization several years ago. PCP will be drawn her blood work.  Note: This dictation was prepared with  Dragon/digital dictation along withSmart phrase technology.  Any transcriptional errors that result from this process are unintentional.   Terrance Mass MD, 12:41 PM 01/04/2014

## 2014-01-04 NOTE — Addendum Note (Signed)
Addended by: Nelva Nay on: 01/04/2014 12:47 PM   Modules accepted: Orders

## 2014-01-04 NOTE — Patient Instructions (Signed)
Levonorgestrel intrauterine device (IUD) What is this medicine? LEVONORGESTREL IUD (LEE voe nor jes trel) is a contraceptive (birth control) device. The device is placed inside the uterus by a healthcare professional. It is used to prevent pregnancy and can also be used to treat heavy bleeding that occurs during your period. Depending on the device, it can be used for 3 to 5 years. This medicine may be used for other purposes; ask your health care provider or pharmacist if you have questions. COMMON BRAND NAME(S): LILETTA, Mirena, Skyla What should I tell my health care provider before I take this medicine? They need to know if you have any of these conditions: -abnormal Pap smear -cancer of the breast, uterus, or cervix -diabetes -endometritis -genital or pelvic infection now or in the past -have more than one sexual partner or your partner has more than one partner -heart disease -history of an ectopic or tubal pregnancy -immune system problems -IUD in place -liver disease or tumor -problems with blood clots or take blood-thinners -use intravenous drugs -uterus of unusual shape -vaginal bleeding that has not been explained -an unusual or allergic reaction to levonorgestrel, other hormones, silicone, or polyethylene, medicines, foods, dyes, or preservatives -pregnant or trying to get pregnant -breast-feeding How should I use this medicine? This device is placed inside the uterus by a health care professional. Talk to your pediatrician regarding the use of this medicine in children. Special care may be needed. Overdosage: If you think you have taken too much of this medicine contact a poison control center or emergency room at once. NOTE: This medicine is only for you. Do not share this medicine with others. What if I miss a dose? This does not apply. What may interact with this medicine? Do not take this medicine with any of the following  medications: -amprenavir -bosentan -fosamprenavir This medicine may also interact with the following medications: -aprepitant -barbiturate medicines for inducing sleep or treating seizures -bexarotene -griseofulvin -medicines to treat seizures like carbamazepine, ethotoin, felbamate, oxcarbazepine, phenytoin, topiramate -modafinil -pioglitazone -rifabutin -rifampin -rifapentine -some medicines to treat HIV infection like atazanavir, indinavir, lopinavir, nelfinavir, tipranavir, ritonavir -St. John's wort -warfarin This list may not describe all possible interactions. Give your health care provider a list of all the medicines, herbs, non-prescription drugs, or dietary supplements you use. Also tell them if you smoke, drink alcohol, or use illegal drugs. Some items may interact with your medicine. What should I watch for while using this medicine? Visit your doctor or health care professional for regular check ups. See your doctor if you or your partner has sexual contact with others, becomes HIV positive, or gets a sexual transmitted disease. This product does not protect you against HIV infection (AIDS) or other sexually transmitted diseases. You can check the placement of the IUD yourself by reaching up to the top of your vagina with clean fingers to feel the threads. Do not pull on the threads. It is a good habit to check placement after each menstrual period. Call your doctor right away if you feel more of the IUD than just the threads or if you cannot feel the threads at all. The IUD may come out by itself. You may become pregnant if the device comes out. If you notice that the IUD has come out use a backup birth control method like condoms and call your health care provider. Using tampons will not change the position of the IUD and are okay to use during your period. What side effects may   I notice from receiving this medicine? Side effects that you should report to your doctor or  health care professional as soon as possible: -allergic reactions like skin rash, itching or hives, swelling of the face, lips, or tongue -fever, flu-like symptoms -genital sores -high blood pressure -no menstrual period for 6 weeks during use -pain, swelling, warmth in the leg -pelvic pain or tenderness -severe or sudden headache -signs of pregnancy -stomach cramping -sudden shortness of breath -trouble with balance, talking, or walking -unusual vaginal bleeding, discharge -yellowing of the eyes or skin Side effects that usually do not require medical attention (report to your doctor or health care professional if they continue or are bothersome): -acne -breast pain -change in sex drive or performance -changes in weight -cramping, dizziness, or faintness while the device is being inserted -headache -irregular menstrual bleeding within first 3 to 6 months of use -nausea This list may not describe all possible side effects. Call your doctor for medical advice about side effects. You may report side effects to FDA at 1-800-FDA-1088. Where should I keep my medicine? This does not apply. NOTE: This sheet is a summary. It may not cover all possible information. If you have questions about this medicine, talk to your doctor, pharmacist, or health care provider.  2015, [employer]. (2011-06-13 13:54:04)  

## 2014-01-05 ENCOUNTER — Telehealth: Payer: Self-pay | Admitting: Gynecology

## 2014-01-05 NOTE — Telephone Encounter (Signed)
01/05/14-Pt was advised today that Medicare doesn't cover the Mirena IUD. Her secondary [employer] coverage did say that they may consider it for payment as Medicare flat out denies coverage regardless of pt age but no guarantees. They cover the IUD under preventative with no copay. Pt was so advised today and will decide if she wants to proceed and call us.wl

## 2014-01-07 LAB — CYTOLOGY - PAP

## 2014-01-11 DIAGNOSIS — E1139 Type 2 diabetes mellitus with other diabetic ophthalmic complication: Secondary | ICD-10-CM | POA: Diagnosis not present

## 2014-01-11 DIAGNOSIS — E11359 Type 2 diabetes mellitus with proliferative diabetic retinopathy without macular edema: Secondary | ICD-10-CM | POA: Diagnosis not present

## 2014-01-24 DIAGNOSIS — N186 End stage renal disease: Secondary | ICD-10-CM | POA: Diagnosis not present

## 2014-01-26 DIAGNOSIS — D631 Anemia in chronic kidney disease: Secondary | ICD-10-CM | POA: Diagnosis not present

## 2014-01-26 DIAGNOSIS — N186 End stage renal disease: Secondary | ICD-10-CM | POA: Diagnosis not present

## 2014-01-26 DIAGNOSIS — N2581 Secondary hyperparathyroidism of renal origin: Secondary | ICD-10-CM | POA: Diagnosis not present

## 2014-01-26 DIAGNOSIS — E1129 Type 2 diabetes mellitus with other diabetic kidney complication: Secondary | ICD-10-CM | POA: Diagnosis not present

## 2014-01-28 DIAGNOSIS — D631 Anemia in chronic kidney disease: Secondary | ICD-10-CM | POA: Diagnosis not present

## 2014-01-28 DIAGNOSIS — N2581 Secondary hyperparathyroidism of renal origin: Secondary | ICD-10-CM | POA: Diagnosis not present

## 2014-01-28 DIAGNOSIS — E1129 Type 2 diabetes mellitus with other diabetic kidney complication: Secondary | ICD-10-CM | POA: Diagnosis not present

## 2014-01-28 DIAGNOSIS — N186 End stage renal disease: Secondary | ICD-10-CM | POA: Diagnosis not present

## 2014-01-31 DIAGNOSIS — N039 Chronic nephritic syndrome with unspecified morphologic changes: Secondary | ICD-10-CM | POA: Diagnosis not present

## 2014-01-31 DIAGNOSIS — D631 Anemia in chronic kidney disease: Secondary | ICD-10-CM | POA: Diagnosis not present

## 2014-01-31 DIAGNOSIS — N2581 Secondary hyperparathyroidism of renal origin: Secondary | ICD-10-CM | POA: Diagnosis not present

## 2014-01-31 DIAGNOSIS — E1129 Type 2 diabetes mellitus with other diabetic kidney complication: Secondary | ICD-10-CM | POA: Diagnosis not present

## 2014-01-31 DIAGNOSIS — N186 End stage renal disease: Secondary | ICD-10-CM | POA: Diagnosis not present

## 2014-02-02 DIAGNOSIS — D631 Anemia in chronic kidney disease: Secondary | ICD-10-CM | POA: Diagnosis not present

## 2014-02-02 DIAGNOSIS — N186 End stage renal disease: Secondary | ICD-10-CM | POA: Diagnosis not present

## 2014-02-02 DIAGNOSIS — N2581 Secondary hyperparathyroidism of renal origin: Secondary | ICD-10-CM | POA: Diagnosis not present

## 2014-02-02 DIAGNOSIS — E1129 Type 2 diabetes mellitus with other diabetic kidney complication: Secondary | ICD-10-CM | POA: Diagnosis not present

## 2014-02-03 DIAGNOSIS — R42 Dizziness and giddiness: Secondary | ICD-10-CM | POA: Diagnosis not present

## 2014-02-04 DIAGNOSIS — E1129 Type 2 diabetes mellitus with other diabetic kidney complication: Secondary | ICD-10-CM | POA: Diagnosis not present

## 2014-02-04 DIAGNOSIS — N186 End stage renal disease: Secondary | ICD-10-CM | POA: Diagnosis not present

## 2014-02-04 DIAGNOSIS — N2581 Secondary hyperparathyroidism of renal origin: Secondary | ICD-10-CM | POA: Diagnosis not present

## 2014-02-04 DIAGNOSIS — D631 Anemia in chronic kidney disease: Secondary | ICD-10-CM | POA: Diagnosis not present

## 2014-02-07 DIAGNOSIS — N186 End stage renal disease: Secondary | ICD-10-CM | POA: Diagnosis not present

## 2014-02-07 DIAGNOSIS — E1129 Type 2 diabetes mellitus with other diabetic kidney complication: Secondary | ICD-10-CM | POA: Diagnosis not present

## 2014-02-07 DIAGNOSIS — N039 Chronic nephritic syndrome with unspecified morphologic changes: Secondary | ICD-10-CM | POA: Diagnosis not present

## 2014-02-07 DIAGNOSIS — D631 Anemia in chronic kidney disease: Secondary | ICD-10-CM | POA: Diagnosis not present

## 2014-02-07 DIAGNOSIS — N2581 Secondary hyperparathyroidism of renal origin: Secondary | ICD-10-CM | POA: Diagnosis not present

## 2014-02-09 DIAGNOSIS — E1129 Type 2 diabetes mellitus with other diabetic kidney complication: Secondary | ICD-10-CM | POA: Diagnosis not present

## 2014-02-09 DIAGNOSIS — D631 Anemia in chronic kidney disease: Secondary | ICD-10-CM | POA: Diagnosis not present

## 2014-02-09 DIAGNOSIS — N186 End stage renal disease: Secondary | ICD-10-CM | POA: Diagnosis not present

## 2014-02-09 DIAGNOSIS — N2581 Secondary hyperparathyroidism of renal origin: Secondary | ICD-10-CM | POA: Diagnosis not present

## 2014-02-10 DIAGNOSIS — G4489 Other headache syndrome: Secondary | ICD-10-CM | POA: Diagnosis not present

## 2014-02-10 DIAGNOSIS — R51 Headache: Secondary | ICD-10-CM | POA: Diagnosis not present

## 2014-02-10 DIAGNOSIS — E1149 Type 2 diabetes mellitus with other diabetic neurological complication: Secondary | ICD-10-CM | POA: Diagnosis not present

## 2014-02-10 DIAGNOSIS — E1142 Type 2 diabetes mellitus with diabetic polyneuropathy: Secondary | ICD-10-CM | POA: Diagnosis not present

## 2014-02-11 DIAGNOSIS — D631 Anemia in chronic kidney disease: Secondary | ICD-10-CM | POA: Diagnosis not present

## 2014-02-11 DIAGNOSIS — E1129 Type 2 diabetes mellitus with other diabetic kidney complication: Secondary | ICD-10-CM | POA: Diagnosis not present

## 2014-02-11 DIAGNOSIS — N2581 Secondary hyperparathyroidism of renal origin: Secondary | ICD-10-CM | POA: Diagnosis not present

## 2014-02-11 DIAGNOSIS — N186 End stage renal disease: Secondary | ICD-10-CM | POA: Diagnosis not present

## 2014-02-14 DIAGNOSIS — D631 Anemia in chronic kidney disease: Secondary | ICD-10-CM | POA: Diagnosis not present

## 2014-02-14 DIAGNOSIS — N186 End stage renal disease: Secondary | ICD-10-CM | POA: Diagnosis not present

## 2014-02-14 DIAGNOSIS — E1129 Type 2 diabetes mellitus with other diabetic kidney complication: Secondary | ICD-10-CM | POA: Diagnosis not present

## 2014-02-14 DIAGNOSIS — N2581 Secondary hyperparathyroidism of renal origin: Secondary | ICD-10-CM | POA: Diagnosis not present

## 2014-02-16 DIAGNOSIS — N2581 Secondary hyperparathyroidism of renal origin: Secondary | ICD-10-CM | POA: Diagnosis not present

## 2014-02-16 DIAGNOSIS — Z01818 Encounter for other preprocedural examination: Secondary | ICD-10-CM | POA: Diagnosis not present

## 2014-02-16 DIAGNOSIS — N186 End stage renal disease: Secondary | ICD-10-CM | POA: Diagnosis not present

## 2014-02-16 DIAGNOSIS — D631 Anemia in chronic kidney disease: Secondary | ICD-10-CM | POA: Diagnosis not present

## 2014-02-16 DIAGNOSIS — N039 Chronic nephritic syndrome with unspecified morphologic changes: Secondary | ICD-10-CM | POA: Diagnosis not present

## 2014-02-16 DIAGNOSIS — E1129 Type 2 diabetes mellitus with other diabetic kidney complication: Secondary | ICD-10-CM | POA: Diagnosis not present

## 2014-02-18 DIAGNOSIS — N2581 Secondary hyperparathyroidism of renal origin: Secondary | ICD-10-CM | POA: Diagnosis not present

## 2014-02-18 DIAGNOSIS — D631 Anemia in chronic kidney disease: Secondary | ICD-10-CM | POA: Diagnosis not present

## 2014-02-18 DIAGNOSIS — N186 End stage renal disease: Secondary | ICD-10-CM | POA: Diagnosis not present

## 2014-02-18 DIAGNOSIS — E1129 Type 2 diabetes mellitus with other diabetic kidney complication: Secondary | ICD-10-CM | POA: Diagnosis not present

## 2014-02-18 DIAGNOSIS — N039 Chronic nephritic syndrome with unspecified morphologic changes: Secondary | ICD-10-CM | POA: Diagnosis not present

## 2014-02-21 DIAGNOSIS — N2581 Secondary hyperparathyroidism of renal origin: Secondary | ICD-10-CM | POA: Diagnosis not present

## 2014-02-21 DIAGNOSIS — D631 Anemia in chronic kidney disease: Secondary | ICD-10-CM | POA: Diagnosis not present

## 2014-02-21 DIAGNOSIS — E1129 Type 2 diabetes mellitus with other diabetic kidney complication: Secondary | ICD-10-CM | POA: Diagnosis not present

## 2014-02-21 DIAGNOSIS — N186 End stage renal disease: Secondary | ICD-10-CM | POA: Diagnosis not present

## 2014-02-22 ENCOUNTER — Other Ambulatory Visit: Payer: Self-pay | Admitting: Neurology

## 2014-02-22 DIAGNOSIS — A5211 Tabes dorsalis: Secondary | ICD-10-CM | POA: Diagnosis not present

## 2014-02-22 DIAGNOSIS — E1149 Type 2 diabetes mellitus with other diabetic neurological complication: Secondary | ICD-10-CM | POA: Diagnosis not present

## 2014-02-22 DIAGNOSIS — R51 Headache: Secondary | ICD-10-CM

## 2014-02-23 DIAGNOSIS — E1129 Type 2 diabetes mellitus with other diabetic kidney complication: Secondary | ICD-10-CM | POA: Diagnosis not present

## 2014-02-23 DIAGNOSIS — N2581 Secondary hyperparathyroidism of renal origin: Secondary | ICD-10-CM | POA: Diagnosis not present

## 2014-02-23 DIAGNOSIS — D631 Anemia in chronic kidney disease: Secondary | ICD-10-CM | POA: Diagnosis not present

## 2014-02-23 DIAGNOSIS — N186 End stage renal disease: Secondary | ICD-10-CM | POA: Diagnosis not present

## 2014-02-25 DIAGNOSIS — E1129 Type 2 diabetes mellitus with other diabetic kidney complication: Secondary | ICD-10-CM | POA: Diagnosis not present

## 2014-02-25 DIAGNOSIS — D631 Anemia in chronic kidney disease: Secondary | ICD-10-CM | POA: Diagnosis not present

## 2014-02-25 DIAGNOSIS — Z23 Encounter for immunization: Secondary | ICD-10-CM | POA: Diagnosis not present

## 2014-02-25 DIAGNOSIS — N2581 Secondary hyperparathyroidism of renal origin: Secondary | ICD-10-CM | POA: Diagnosis not present

## 2014-02-25 DIAGNOSIS — N186 End stage renal disease: Secondary | ICD-10-CM | POA: Diagnosis not present

## 2014-02-28 DIAGNOSIS — D631 Anemia in chronic kidney disease: Secondary | ICD-10-CM | POA: Diagnosis not present

## 2014-02-28 DIAGNOSIS — E1129 Type 2 diabetes mellitus with other diabetic kidney complication: Secondary | ICD-10-CM | POA: Diagnosis not present

## 2014-02-28 DIAGNOSIS — N2581 Secondary hyperparathyroidism of renal origin: Secondary | ICD-10-CM | POA: Diagnosis not present

## 2014-02-28 DIAGNOSIS — N186 End stage renal disease: Secondary | ICD-10-CM | POA: Diagnosis not present

## 2014-02-28 DIAGNOSIS — Z23 Encounter for immunization: Secondary | ICD-10-CM | POA: Diagnosis not present

## 2014-03-01 ENCOUNTER — Ambulatory Visit
Admission: RE | Admit: 2014-03-01 | Discharge: 2014-03-01 | Disposition: A | Discharge status: Home / Self Care | Payer: Medicare Other | Source: Ambulatory Visit | Attending: Neurology | Admitting: Neurology

## 2014-03-01 ENCOUNTER — Other Ambulatory Visit: Payer: Medicare Other

## 2014-03-01 DIAGNOSIS — M14672 Charcot's joint, left ankle and foot: Secondary | ICD-10-CM | POA: Diagnosis not present

## 2014-03-01 DIAGNOSIS — R51 Headache: Secondary | ICD-10-CM

## 2014-03-02 DIAGNOSIS — D631 Anemia in chronic kidney disease: Secondary | ICD-10-CM | POA: Diagnosis not present

## 2014-03-02 DIAGNOSIS — N186 End stage renal disease: Secondary | ICD-10-CM | POA: Diagnosis not present

## 2014-03-02 DIAGNOSIS — E1129 Type 2 diabetes mellitus with other diabetic kidney complication: Secondary | ICD-10-CM | POA: Diagnosis not present

## 2014-03-02 DIAGNOSIS — Z23 Encounter for immunization: Secondary | ICD-10-CM | POA: Diagnosis not present

## 2014-03-02 DIAGNOSIS — N2581 Secondary hyperparathyroidism of renal origin: Secondary | ICD-10-CM | POA: Diagnosis not present

## 2014-03-04 DIAGNOSIS — Z23 Encounter for immunization: Secondary | ICD-10-CM | POA: Diagnosis not present

## 2014-03-04 DIAGNOSIS — D631 Anemia in chronic kidney disease: Secondary | ICD-10-CM | POA: Diagnosis not present

## 2014-03-04 DIAGNOSIS — E1129 Type 2 diabetes mellitus with other diabetic kidney complication: Secondary | ICD-10-CM | POA: Diagnosis not present

## 2014-03-04 DIAGNOSIS — N2581 Secondary hyperparathyroidism of renal origin: Secondary | ICD-10-CM | POA: Diagnosis not present

## 2014-03-04 DIAGNOSIS — N186 End stage renal disease: Secondary | ICD-10-CM | POA: Diagnosis not present

## 2014-03-07 DIAGNOSIS — N2581 Secondary hyperparathyroidism of renal origin: Secondary | ICD-10-CM | POA: Diagnosis not present

## 2014-03-07 DIAGNOSIS — D631 Anemia in chronic kidney disease: Secondary | ICD-10-CM | POA: Diagnosis not present

## 2014-03-07 DIAGNOSIS — N186 End stage renal disease: Secondary | ICD-10-CM | POA: Diagnosis not present

## 2014-03-07 DIAGNOSIS — E1129 Type 2 diabetes mellitus with other diabetic kidney complication: Secondary | ICD-10-CM | POA: Diagnosis not present

## 2014-03-07 DIAGNOSIS — Z23 Encounter for immunization: Secondary | ICD-10-CM | POA: Diagnosis not present

## 2014-03-08 DIAGNOSIS — N186 End stage renal disease: Secondary | ICD-10-CM | POA: Diagnosis not present

## 2014-03-08 DIAGNOSIS — E1065 Type 1 diabetes mellitus with hyperglycemia: Secondary | ICD-10-CM | POA: Diagnosis not present

## 2014-03-08 DIAGNOSIS — E782 Mixed hyperlipidemia: Secondary | ICD-10-CM | POA: Diagnosis not present

## 2014-03-08 DIAGNOSIS — E1021 Type 1 diabetes mellitus with diabetic nephropathy: Secondary | ICD-10-CM | POA: Diagnosis not present

## 2014-03-09 DIAGNOSIS — N186 End stage renal disease: Secondary | ICD-10-CM | POA: Diagnosis not present

## 2014-03-09 DIAGNOSIS — D631 Anemia in chronic kidney disease: Secondary | ICD-10-CM | POA: Diagnosis not present

## 2014-03-09 DIAGNOSIS — N2581 Secondary hyperparathyroidism of renal origin: Secondary | ICD-10-CM | POA: Diagnosis not present

## 2014-03-09 DIAGNOSIS — Z23 Encounter for immunization: Secondary | ICD-10-CM | POA: Diagnosis not present

## 2014-03-09 DIAGNOSIS — E1129 Type 2 diabetes mellitus with other diabetic kidney complication: Secondary | ICD-10-CM | POA: Diagnosis not present

## 2014-03-10 DIAGNOSIS — G43019 Migraine without aura, intractable, without status migrainosus: Secondary | ICD-10-CM | POA: Diagnosis not present

## 2014-03-10 DIAGNOSIS — G4489 Other headache syndrome: Secondary | ICD-10-CM | POA: Diagnosis not present

## 2014-03-11 DIAGNOSIS — N186 End stage renal disease: Secondary | ICD-10-CM | POA: Diagnosis not present

## 2014-03-11 DIAGNOSIS — Z23 Encounter for immunization: Secondary | ICD-10-CM | POA: Diagnosis not present

## 2014-03-11 DIAGNOSIS — D631 Anemia in chronic kidney disease: Secondary | ICD-10-CM | POA: Diagnosis not present

## 2014-03-11 DIAGNOSIS — N2581 Secondary hyperparathyroidism of renal origin: Secondary | ICD-10-CM | POA: Diagnosis not present

## 2014-03-11 DIAGNOSIS — E1129 Type 2 diabetes mellitus with other diabetic kidney complication: Secondary | ICD-10-CM | POA: Diagnosis not present

## 2014-03-14 DIAGNOSIS — D631 Anemia in chronic kidney disease: Secondary | ICD-10-CM | POA: Diagnosis not present

## 2014-03-14 DIAGNOSIS — N186 End stage renal disease: Secondary | ICD-10-CM | POA: Diagnosis not present

## 2014-03-14 DIAGNOSIS — N2581 Secondary hyperparathyroidism of renal origin: Secondary | ICD-10-CM | POA: Diagnosis not present

## 2014-03-14 DIAGNOSIS — Z23 Encounter for immunization: Secondary | ICD-10-CM | POA: Diagnosis not present

## 2014-03-14 DIAGNOSIS — E1129 Type 2 diabetes mellitus with other diabetic kidney complication: Secondary | ICD-10-CM | POA: Diagnosis not present

## 2014-03-16 DIAGNOSIS — N186 End stage renal disease: Secondary | ICD-10-CM | POA: Diagnosis not present

## 2014-03-16 DIAGNOSIS — E1129 Type 2 diabetes mellitus with other diabetic kidney complication: Secondary | ICD-10-CM | POA: Diagnosis not present

## 2014-03-16 DIAGNOSIS — N2581 Secondary hyperparathyroidism of renal origin: Secondary | ICD-10-CM | POA: Diagnosis not present

## 2014-03-16 DIAGNOSIS — Z23 Encounter for immunization: Secondary | ICD-10-CM | POA: Diagnosis not present

## 2014-03-16 DIAGNOSIS — D631 Anemia in chronic kidney disease: Secondary | ICD-10-CM | POA: Diagnosis not present

## 2014-03-18 DIAGNOSIS — E1129 Type 2 diabetes mellitus with other diabetic kidney complication: Secondary | ICD-10-CM | POA: Diagnosis not present

## 2014-03-18 DIAGNOSIS — D631 Anemia in chronic kidney disease: Secondary | ICD-10-CM | POA: Diagnosis not present

## 2014-03-18 DIAGNOSIS — Z23 Encounter for immunization: Secondary | ICD-10-CM | POA: Diagnosis not present

## 2014-03-18 DIAGNOSIS — N2581 Secondary hyperparathyroidism of renal origin: Secondary | ICD-10-CM | POA: Diagnosis not present

## 2014-03-18 DIAGNOSIS — N186 End stage renal disease: Secondary | ICD-10-CM | POA: Diagnosis not present

## 2014-03-21 DIAGNOSIS — Z23 Encounter for immunization: Secondary | ICD-10-CM | POA: Diagnosis not present

## 2014-03-21 DIAGNOSIS — E1129 Type 2 diabetes mellitus with other diabetic kidney complication: Secondary | ICD-10-CM | POA: Diagnosis not present

## 2014-03-21 DIAGNOSIS — D631 Anemia in chronic kidney disease: Secondary | ICD-10-CM | POA: Diagnosis not present

## 2014-03-21 DIAGNOSIS — N2581 Secondary hyperparathyroidism of renal origin: Secondary | ICD-10-CM | POA: Diagnosis not present

## 2014-03-21 DIAGNOSIS — N186 End stage renal disease: Secondary | ICD-10-CM | POA: Diagnosis not present

## 2014-03-23 DIAGNOSIS — E1129 Type 2 diabetes mellitus with other diabetic kidney complication: Secondary | ICD-10-CM | POA: Diagnosis not present

## 2014-03-23 DIAGNOSIS — N186 End stage renal disease: Secondary | ICD-10-CM | POA: Diagnosis not present

## 2014-03-23 DIAGNOSIS — D631 Anemia in chronic kidney disease: Secondary | ICD-10-CM | POA: Diagnosis not present

## 2014-03-23 DIAGNOSIS — Z23 Encounter for immunization: Secondary | ICD-10-CM | POA: Diagnosis not present

## 2014-03-23 DIAGNOSIS — N2581 Secondary hyperparathyroidism of renal origin: Secondary | ICD-10-CM | POA: Diagnosis not present

## 2014-03-25 DIAGNOSIS — Z23 Encounter for immunization: Secondary | ICD-10-CM | POA: Diagnosis not present

## 2014-03-25 DIAGNOSIS — E1129 Type 2 diabetes mellitus with other diabetic kidney complication: Secondary | ICD-10-CM | POA: Diagnosis not present

## 2014-03-25 DIAGNOSIS — N2581 Secondary hyperparathyroidism of renal origin: Secondary | ICD-10-CM | POA: Diagnosis not present

## 2014-03-25 DIAGNOSIS — N186 End stage renal disease: Secondary | ICD-10-CM | POA: Diagnosis not present

## 2014-03-25 DIAGNOSIS — D631 Anemia in chronic kidney disease: Secondary | ICD-10-CM | POA: Diagnosis not present

## 2014-03-28 ENCOUNTER — Encounter: Payer: Self-pay | Admitting: Gynecology

## 2014-03-28 DIAGNOSIS — N186 End stage renal disease: Secondary | ICD-10-CM | POA: Diagnosis not present

## 2014-03-28 DIAGNOSIS — E1129 Type 2 diabetes mellitus with other diabetic kidney complication: Secondary | ICD-10-CM | POA: Diagnosis not present

## 2014-03-28 DIAGNOSIS — N2581 Secondary hyperparathyroidism of renal origin: Secondary | ICD-10-CM | POA: Diagnosis not present

## 2014-03-28 DIAGNOSIS — D509 Iron deficiency anemia, unspecified: Secondary | ICD-10-CM | POA: Diagnosis not present

## 2014-03-28 DIAGNOSIS — D631 Anemia in chronic kidney disease: Secondary | ICD-10-CM | POA: Diagnosis not present

## 2014-04-27 DIAGNOSIS — D509 Iron deficiency anemia, unspecified: Secondary | ICD-10-CM | POA: Diagnosis not present

## 2014-04-27 DIAGNOSIS — N186 End stage renal disease: Secondary | ICD-10-CM | POA: Diagnosis not present

## 2014-04-27 DIAGNOSIS — D631 Anemia in chronic kidney disease: Secondary | ICD-10-CM | POA: Diagnosis not present

## 2014-04-27 DIAGNOSIS — N2581 Secondary hyperparathyroidism of renal origin: Secondary | ICD-10-CM | POA: Diagnosis not present

## 2014-04-27 DIAGNOSIS — E1129 Type 2 diabetes mellitus with other diabetic kidney complication: Secondary | ICD-10-CM | POA: Diagnosis not present

## 2014-04-28 DIAGNOSIS — G4489 Other headache syndrome: Secondary | ICD-10-CM | POA: Diagnosis not present

## 2014-04-28 DIAGNOSIS — G43019 Migraine without aura, intractable, without status migrainosus: Secondary | ICD-10-CM | POA: Diagnosis not present

## 2014-04-29 DIAGNOSIS — D631 Anemia in chronic kidney disease: Secondary | ICD-10-CM | POA: Diagnosis not present

## 2014-04-29 DIAGNOSIS — N186 End stage renal disease: Secondary | ICD-10-CM | POA: Diagnosis not present

## 2014-04-29 DIAGNOSIS — E1129 Type 2 diabetes mellitus with other diabetic kidney complication: Secondary | ICD-10-CM | POA: Diagnosis not present

## 2014-04-29 DIAGNOSIS — D509 Iron deficiency anemia, unspecified: Secondary | ICD-10-CM | POA: Diagnosis not present

## 2014-04-29 DIAGNOSIS — N2581 Secondary hyperparathyroidism of renal origin: Secondary | ICD-10-CM | POA: Diagnosis not present

## 2014-05-02 DIAGNOSIS — D509 Iron deficiency anemia, unspecified: Secondary | ICD-10-CM | POA: Diagnosis not present

## 2014-05-02 DIAGNOSIS — D631 Anemia in chronic kidney disease: Secondary | ICD-10-CM | POA: Diagnosis not present

## 2014-05-02 DIAGNOSIS — N2581 Secondary hyperparathyroidism of renal origin: Secondary | ICD-10-CM | POA: Diagnosis not present

## 2014-05-02 DIAGNOSIS — E1129 Type 2 diabetes mellitus with other diabetic kidney complication: Secondary | ICD-10-CM | POA: Diagnosis not present

## 2014-05-02 DIAGNOSIS — N186 End stage renal disease: Secondary | ICD-10-CM | POA: Diagnosis not present

## 2014-05-03 DIAGNOSIS — H3500 Unspecified background retinopathy: Secondary | ICD-10-CM | POA: Diagnosis not present

## 2014-05-03 DIAGNOSIS — H35371 Puckering of macula, right eye: Secondary | ICD-10-CM | POA: Diagnosis not present

## 2014-05-03 DIAGNOSIS — E11359 Type 2 diabetes mellitus with proliferative diabetic retinopathy without macular edema: Secondary | ICD-10-CM | POA: Diagnosis not present

## 2014-05-04 DIAGNOSIS — E1129 Type 2 diabetes mellitus with other diabetic kidney complication: Secondary | ICD-10-CM | POA: Diagnosis not present

## 2014-05-04 DIAGNOSIS — N186 End stage renal disease: Secondary | ICD-10-CM | POA: Diagnosis not present

## 2014-05-04 DIAGNOSIS — D631 Anemia in chronic kidney disease: Secondary | ICD-10-CM | POA: Diagnosis not present

## 2014-05-04 DIAGNOSIS — D509 Iron deficiency anemia, unspecified: Secondary | ICD-10-CM | POA: Diagnosis not present

## 2014-05-04 DIAGNOSIS — N2581 Secondary hyperparathyroidism of renal origin: Secondary | ICD-10-CM | POA: Diagnosis not present

## 2014-05-06 DIAGNOSIS — E1129 Type 2 diabetes mellitus with other diabetic kidney complication: Secondary | ICD-10-CM | POA: Diagnosis not present

## 2014-05-06 DIAGNOSIS — N2581 Secondary hyperparathyroidism of renal origin: Secondary | ICD-10-CM | POA: Diagnosis not present

## 2014-05-06 DIAGNOSIS — D509 Iron deficiency anemia, unspecified: Secondary | ICD-10-CM | POA: Diagnosis not present

## 2014-05-06 DIAGNOSIS — N186 End stage renal disease: Secondary | ICD-10-CM | POA: Diagnosis not present

## 2014-05-06 DIAGNOSIS — D631 Anemia in chronic kidney disease: Secondary | ICD-10-CM | POA: Diagnosis not present

## 2014-05-09 DIAGNOSIS — N186 End stage renal disease: Secondary | ICD-10-CM | POA: Diagnosis not present

## 2014-05-09 DIAGNOSIS — E1129 Type 2 diabetes mellitus with other diabetic kidney complication: Secondary | ICD-10-CM | POA: Diagnosis not present

## 2014-05-09 DIAGNOSIS — D631 Anemia in chronic kidney disease: Secondary | ICD-10-CM | POA: Diagnosis not present

## 2014-05-09 DIAGNOSIS — D509 Iron deficiency anemia, unspecified: Secondary | ICD-10-CM | POA: Diagnosis not present

## 2014-05-09 DIAGNOSIS — N2581 Secondary hyperparathyroidism of renal origin: Secondary | ICD-10-CM | POA: Diagnosis not present

## 2014-05-11 DIAGNOSIS — D509 Iron deficiency anemia, unspecified: Secondary | ICD-10-CM | POA: Diagnosis not present

## 2014-05-11 DIAGNOSIS — N2581 Secondary hyperparathyroidism of renal origin: Secondary | ICD-10-CM | POA: Diagnosis not present

## 2014-05-11 DIAGNOSIS — E1129 Type 2 diabetes mellitus with other diabetic kidney complication: Secondary | ICD-10-CM | POA: Diagnosis not present

## 2014-05-11 DIAGNOSIS — N186 End stage renal disease: Secondary | ICD-10-CM | POA: Diagnosis not present

## 2014-05-11 DIAGNOSIS — D631 Anemia in chronic kidney disease: Secondary | ICD-10-CM | POA: Diagnosis not present

## 2014-05-12 DIAGNOSIS — D225 Melanocytic nevi of trunk: Secondary | ICD-10-CM | POA: Diagnosis not present

## 2014-05-12 DIAGNOSIS — L814 Other melanin hyperpigmentation: Secondary | ICD-10-CM | POA: Diagnosis not present

## 2014-05-12 DIAGNOSIS — B078 Other viral warts: Secondary | ICD-10-CM | POA: Diagnosis not present

## 2014-05-12 DIAGNOSIS — D485 Neoplasm of uncertain behavior of skin: Secondary | ICD-10-CM | POA: Diagnosis not present

## 2014-05-13 DIAGNOSIS — N2581 Secondary hyperparathyroidism of renal origin: Secondary | ICD-10-CM | POA: Diagnosis not present

## 2014-05-13 DIAGNOSIS — D509 Iron deficiency anemia, unspecified: Secondary | ICD-10-CM | POA: Diagnosis not present

## 2014-05-13 DIAGNOSIS — D631 Anemia in chronic kidney disease: Secondary | ICD-10-CM | POA: Diagnosis not present

## 2014-05-13 DIAGNOSIS — N186 End stage renal disease: Secondary | ICD-10-CM | POA: Diagnosis not present

## 2014-05-13 DIAGNOSIS — E1129 Type 2 diabetes mellitus with other diabetic kidney complication: Secondary | ICD-10-CM | POA: Diagnosis not present

## 2014-05-16 DIAGNOSIS — E1129 Type 2 diabetes mellitus with other diabetic kidney complication: Secondary | ICD-10-CM | POA: Diagnosis not present

## 2014-05-16 DIAGNOSIS — N186 End stage renal disease: Secondary | ICD-10-CM | POA: Diagnosis not present

## 2014-05-16 DIAGNOSIS — D509 Iron deficiency anemia, unspecified: Secondary | ICD-10-CM | POA: Diagnosis not present

## 2014-05-16 DIAGNOSIS — N2581 Secondary hyperparathyroidism of renal origin: Secondary | ICD-10-CM | POA: Diagnosis not present

## 2014-05-16 DIAGNOSIS — D631 Anemia in chronic kidney disease: Secondary | ICD-10-CM | POA: Diagnosis not present

## 2014-05-18 DIAGNOSIS — D509 Iron deficiency anemia, unspecified: Secondary | ICD-10-CM | POA: Diagnosis not present

## 2014-05-18 DIAGNOSIS — N186 End stage renal disease: Secondary | ICD-10-CM | POA: Diagnosis not present

## 2014-05-18 DIAGNOSIS — E1129 Type 2 diabetes mellitus with other diabetic kidney complication: Secondary | ICD-10-CM | POA: Diagnosis not present

## 2014-05-18 DIAGNOSIS — D631 Anemia in chronic kidney disease: Secondary | ICD-10-CM | POA: Diagnosis not present

## 2014-05-18 DIAGNOSIS — N2581 Secondary hyperparathyroidism of renal origin: Secondary | ICD-10-CM | POA: Diagnosis not present

## 2014-05-21 DIAGNOSIS — N2581 Secondary hyperparathyroidism of renal origin: Secondary | ICD-10-CM | POA: Diagnosis not present

## 2014-05-21 DIAGNOSIS — D509 Iron deficiency anemia, unspecified: Secondary | ICD-10-CM | POA: Diagnosis not present

## 2014-05-21 DIAGNOSIS — E1129 Type 2 diabetes mellitus with other diabetic kidney complication: Secondary | ICD-10-CM | POA: Diagnosis not present

## 2014-05-21 DIAGNOSIS — D631 Anemia in chronic kidney disease: Secondary | ICD-10-CM | POA: Diagnosis not present

## 2014-05-21 DIAGNOSIS — N186 End stage renal disease: Secondary | ICD-10-CM | POA: Diagnosis not present

## 2014-05-23 ENCOUNTER — Encounter (HOSPITAL_COMMUNITY): Payer: Self-pay | Admitting: Emergency Medicine

## 2014-05-23 ENCOUNTER — Emergency Department (HOSPITAL_COMMUNITY): Payer: Medicare Other

## 2014-05-23 ENCOUNTER — Emergency Department (HOSPITAL_COMMUNITY)
Admission: EM | Admit: 2014-05-23 | Discharge: 2014-05-24 | Disposition: A | Discharge status: Home / Self Care | Payer: Medicare Other | Attending: Emergency Medicine | Admitting: Emergency Medicine

## 2014-05-23 DIAGNOSIS — Z79899 Other long term (current) drug therapy: Secondary | ICD-10-CM | POA: Diagnosis not present

## 2014-05-23 DIAGNOSIS — Z90710 Acquired absence of both cervix and uterus: Secondary | ICD-10-CM | POA: Insufficient documentation

## 2014-05-23 DIAGNOSIS — R1032 Left lower quadrant pain: Secondary | ICD-10-CM | POA: Insufficient documentation

## 2014-05-23 DIAGNOSIS — N186 End stage renal disease: Secondary | ICD-10-CM | POA: Insufficient documentation

## 2014-05-23 DIAGNOSIS — D631 Anemia in chronic kidney disease: Secondary | ICD-10-CM | POA: Diagnosis not present

## 2014-05-23 DIAGNOSIS — G43909 Migraine, unspecified, not intractable, without status migrainosus: Secondary | ICD-10-CM | POA: Insufficient documentation

## 2014-05-23 DIAGNOSIS — Z992 Dependence on renal dialysis: Secondary | ICD-10-CM | POA: Insufficient documentation

## 2014-05-23 DIAGNOSIS — I2510 Atherosclerotic heart disease of native coronary artery without angina pectoris: Secondary | ICD-10-CM | POA: Diagnosis not present

## 2014-05-23 DIAGNOSIS — G629 Polyneuropathy, unspecified: Secondary | ICD-10-CM | POA: Insufficient documentation

## 2014-05-23 DIAGNOSIS — E1129 Type 2 diabetes mellitus with other diabetic kidney complication: Secondary | ICD-10-CM | POA: Diagnosis not present

## 2014-05-23 DIAGNOSIS — E119 Type 2 diabetes mellitus without complications: Secondary | ICD-10-CM | POA: Insufficient documentation

## 2014-05-23 DIAGNOSIS — Z872 Personal history of diseases of the skin and subcutaneous tissue: Secondary | ICD-10-CM | POA: Diagnosis not present

## 2014-05-23 DIAGNOSIS — R1012 Left upper quadrant pain: Secondary | ICD-10-CM | POA: Insufficient documentation

## 2014-05-23 DIAGNOSIS — Z86001 Personal history of in-situ neoplasm of cervix uteri: Secondary | ICD-10-CM | POA: Diagnosis not present

## 2014-05-23 DIAGNOSIS — E785 Hyperlipidemia, unspecified: Secondary | ICD-10-CM | POA: Insufficient documentation

## 2014-05-23 DIAGNOSIS — Z862 Personal history of diseases of the blood and blood-forming organs and certain disorders involving the immune mechanism: Secondary | ICD-10-CM | POA: Insufficient documentation

## 2014-05-23 DIAGNOSIS — I120 Hypertensive chronic kidney disease with stage 5 chronic kidney disease or end stage renal disease: Secondary | ICD-10-CM | POA: Diagnosis not present

## 2014-05-23 DIAGNOSIS — Z794 Long term (current) use of insulin: Secondary | ICD-10-CM | POA: Diagnosis not present

## 2014-05-23 DIAGNOSIS — Z7902 Long term (current) use of antithrombotics/antiplatelets: Secondary | ICD-10-CM | POA: Diagnosis not present

## 2014-05-23 DIAGNOSIS — R109 Unspecified abdominal pain: Secondary | ICD-10-CM | POA: Diagnosis present

## 2014-05-23 DIAGNOSIS — Z87891 Personal history of nicotine dependence: Secondary | ICD-10-CM | POA: Diagnosis not present

## 2014-05-23 DIAGNOSIS — D509 Iron deficiency anemia, unspecified: Secondary | ICD-10-CM | POA: Diagnosis not present

## 2014-05-23 DIAGNOSIS — N2581 Secondary hyperparathyroidism of renal origin: Secondary | ICD-10-CM | POA: Diagnosis not present

## 2014-05-23 LAB — CBC WITH DIFFERENTIAL/PLATELET
Basophils Absolute: 0 10*3/uL (ref 0.0–0.1)
Basophils Relative: 0 % (ref 0–1)
Eosinophils Absolute: 0.2 10*3/uL (ref 0.0–0.7)
Eosinophils Relative: 2 % (ref 0–5)
HCT: 37.5 % (ref 36.0–46.0)
Hemoglobin: 12.1 g/dL (ref 12.0–15.0)
LYMPHS ABS: 2.2 10*3/uL (ref 0.7–4.0)
Lymphocytes Relative: 26 % (ref 12–46)
MCH: 32.6 pg (ref 26.0–34.0)
MCHC: 32.3 g/dL (ref 30.0–36.0)
MCV: 101.1 fL — AB (ref 78.0–100.0)
Monocytes Absolute: 0.6 10*3/uL (ref 0.1–1.0)
Monocytes Relative: 7 % (ref 3–12)
Neutro Abs: 5.7 10*3/uL (ref 1.7–7.7)
Neutrophils Relative %: 65 % (ref 43–77)
Platelets: 161 10*3/uL (ref 150–400)
RBC: 3.71 MIL/uL — AB (ref 3.87–5.11)
RDW: 12.8 % (ref 11.5–15.5)
WBC: 8.7 10*3/uL (ref 4.0–10.5)

## 2014-05-23 LAB — COMPREHENSIVE METABOLIC PANEL
ALT: 12 U/L (ref 0–35)
AST: 14 U/L (ref 0–37)
Albumin: 4.2 g/dL (ref 3.5–5.2)
Alkaline Phosphatase: 56 U/L (ref 39–117)
Anion gap: 12 (ref 5–15)
BILIRUBIN TOTAL: 0.3 mg/dL (ref 0.3–1.2)
BUN: 41 mg/dL — ABNORMAL HIGH (ref 6–23)
CHLORIDE: 98 meq/L (ref 96–112)
CO2: 28 mmol/L (ref 19–32)
Calcium: 9.7 mg/dL (ref 8.4–10.5)
Creatinine, Ser: 6.48 mg/dL — ABNORMAL HIGH (ref 0.50–1.10)
GFR, EST AFRICAN AMERICAN: 9 mL/min — AB (ref >90)
GFR, EST NON AFRICAN AMERICAN: 7 mL/min — AB (ref >90)
GLUCOSE: 198 mg/dL — AB (ref 70–99)
POTASSIUM: 4.8 mmol/L (ref 3.5–5.1)
SODIUM: 138 mmol/L (ref 135–145)
Total Protein: 7.8 g/dL (ref 6.0–8.3)

## 2014-05-23 LAB — LIPASE, BLOOD: Lipase: 45 U/L (ref 11–59)

## 2014-05-23 LAB — I-STAT BETA HCG BLOOD, ED (MC, WL, AP ONLY)

## 2014-05-23 MED ORDER — MORPHINE SULFATE 4 MG/ML IJ SOLN
4.0000 mg | Freq: Once | INTRAMUSCULAR | Status: AC
  Administered 2014-05-23: 4 mg via INTRAVENOUS
  Filled 2014-05-23: qty 1

## 2014-05-23 NOTE — ED Notes (Signed)
Pt reports L pelvic pain radiating to her L abd and L back.  Pt reports pain started last week which subsided and recurred today.  Pt is a dialysis pt, had dialysis today.

## 2014-05-23 NOTE — ED Provider Notes (Signed)
CSN: [id number]     Arrival date & time 05/23/14  1904 History   First MD Initiated Contact with Patient 05/23/14 2145     Chief Complaint  Patient presents with  . Flank Pain     (Consider location/radiation/quality/duration/timing/severity/associated sxs/prior Treatment) HPI   Pt with DM, ESRD on dialysis (full session today), gastroparesis presents with left sided abdominal pain with radiation into the left midback.  The pain is constant 4/10 aching pain with occasional 8/10 sharp pains.  Had a normal BM this afternoon.  Denies urinary symptoms (she makes a small amount of urine).  Denies abnormal vaginal discharge or bleeding.  Has irregular periods, last 2 months ago.  Denies fevers, CP, SOB.  No hx abdominal surgeries.    Past Medical History  Diagnosis Date  . DM (diabetes mellitus)     Long-term insulin  . Hypertension   . Tobacco use disorder     Discontinued March 2012  . Hyperlipidemia     Hypertriglyceridemia 449 HDL 25  . Diabetic Charcot's joint disease   . Coronary artery disease     Status post cardiac catheterization June 2012 scattered coronary artery disease/atherosclerosis with 70-80% stenosis in a small right PDA.  . Gastroparesis   . Peripheral vascular disease     Tibial occlusive disease evaluated by Dr. Kellie Simmering in August 2011. Medical therapy  . 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 CIN III (cervical intraepithelial neoplasia grade III) with severe dysplasia     S/P LEEP AND CONE  . Chronic anemia     2nd to renal disease  . End stage renal disease on dialysis 05/02/11    [facility]; M; W, F; last time 05/01/11  . Migraines     "just on dialysis days"  . Peripheral neuropathy     related to DM  . History of abscesses in groins 12/06/2010   Past Surgical History  Procedure Laterality Date  . Av fistula placement  04/2010  . Dilation and curettage of uterus  2009  . Cervical biopsy  w/ loop electrode excision      h/o  . Cervical cone biopsy      h/o  .  Colposcopy     Family History  Problem Relation Age of Onset  . Diabetes Mother   . Diabetes Father   . Diabetes Sister   . Breast cancer Maternal Aunt     Age 3's   History  Substance Use Topics  . Smoking status: Former Smoker -- 0.30 packs/day for 10 years    Types: Cigarettes    Quit date: 08/05/2011  . Smokeless tobacco: Never Used     Comment: "quit smoking cigarettes 07/2010"  . Alcohol Use: No   OB History    Gravida Para Term Preterm AB TAB SAB Ectopic Multiple Living   2 1 1  1     1      Review of Systems  All other systems reviewed and are negative.     Allergies  Cephalexin and Sulfamethoxazole-trimethoprim  Home Medications   Prior to Admission medications   Medication Sig Start Date End Date Taking? Authorizing Provider  amLODipine (NORVASC) 10 MG tablet Take 1 tablet (10 mg total) by mouth daily. 07/05/13  Yes Minus Breeding, MD  atorvastatin (LIPITOR) 80 MG tablet Take 1 tablet (80 mg total) by mouth every evening. 02/06/12  Yes Donney Dice, PA-C  cinacalcet (SENSIPAR) 30 MG tablet Take 30 mg by mouth daily.   Yes Historical Provider, MD  cloNIDine (CATAPRES) 0.1 MG tablet Take 0.1 mg by mouth 2 (two) times daily.   Yes Historical Provider, MD  clopidogrel (PLAVIX) 75 MG tablet Take 75 mg by mouth daily with breakfast.   Yes Historical Provider, MD  gabapentin (NEURONTIN) 300 MG capsule Take 300 mg by mouth every evening.  06/19/13  Yes Historical Provider, MD  HUMALOG MIX 50/50 KWIKPEN (50-50) 100 UNIT/ML Kwikpen Inject 26-28 Units into the skin 2 (two) times daily. 28 in the morning and 26 in the evening. 04/29/14  Yes Historical Provider, MD  imipramine (TOFRANIL) 10 MG tablet Take 40 mg by mouth at bedtime.  04/28/14  Yes Historical Provider, MD  insulin lispro (HUMALOG) 100 UNIT/ML injection Inject 6-8 Units into the skin 3 (three) times daily before meals. Per sliding scale   Yes Historical Provider, MD  isosorbide mononitrate (IMDUR) 120 MG 24 hr  tablet Take 120 mg by mouth daily.   Yes Historical Provider, MD  labetalol (NORMODYNE) 300 MG tablet Take 1 tablet (300 mg total) by mouth 3 (three) times daily. Patient taking differently: Take 300 mg by mouth 2 (two) times daily.  05/15/12  Yes Minus Breeding, MD  nitroGLYCERIN (NITROSTAT) 0.4 MG SL tablet Place 0.4 mg under the tongue every 5 (five) minutes as needed. For chest pain   Yes Historical Provider, MD  RENVELA 800 MG tablet Take 1 tablet by mouth 4 (four) times daily - after meals and at bedtime. 04/17/13  Yes Historical Provider, MD   BP 134/78 mmHg  Pulse 106  Temp(Src) 98.4 F (36.9 C) (Oral)  Resp 20  SpO2 100%  LMP 03/23/2014 Physical Exam  Constitutional: She appears well-developed and well-nourished. No distress.  HENT:  Head: Normocephalic and atraumatic.  Neck: Neck supple.  Cardiovascular: Normal rate and regular rhythm.   Pulmonary/Chest: Effort normal and breath sounds normal. No respiratory distress. She has no wheezes. She has no rales.  Abdominal: Soft. Bowel sounds are normal. She exhibits no distension and no mass. There is tenderness in the left upper quadrant and left lower quadrant. There is CVA tenderness (left). There is no rigidity, no rebound and no guarding.  Neurological: She is alert.  Skin: She is not diaphoretic.  Nursing note and vitals reviewed.   ED Course  Procedures (including critical care time) Labs Review Labs Reviewed  COMPREHENSIVE METABOLIC PANEL - Abnormal; Notable for the following:    Glucose, Bld 198 (*)    BUN 41 (*)    Creatinine, Ser 6.48 (*)    GFR calc non Af Amer 7 (*)    GFR calc Af Amer 9 (*)    All other components within normal limits  CBC WITH DIFFERENTIAL - Abnormal; Notable for the following:    RBC 3.71 (*)    MCV 101.1 (*)    All other components within normal limits  LIPASE, BLOOD  URINALYSIS, ROUTINE W REFLEX MICROSCOPIC  I-STAT BETA HCG BLOOD, ED ([facility], [facility], [facility] ONLY)    Imaging Review Ct Abdomen  Pelvis Wo Contrast  05/24/2014   CLINICAL DATA:  Left lower quadrant abdominal pain x3 days  EXAM: CT ABDOMEN AND PELVIS WITHOUT CONTRAST  TECHNIQUE: Multidetector CT imaging of the abdomen and pelvis was performed following the standard protocol without IV contrast.  COMPARISON:  01/20/2009  FINDINGS: Lower chest:  Lung bases are clear.  Hepatobiliary: Unenhanced liver is notable for a 10 mm hypoenhancing lesion inferiorly in the anterior segment right hepatic lobe (series 2/ image 34), incompletely characterized, likely benign.  Gallbladder is underdistended. No intrahepatic or extrahepatic ductal dilatation.  Pancreas: Within normal limits.  Spleen: Within normal limits.  Adrenals/Urinary Tract: Adrenal glands are unremarkable.  Kidneys are notable for bilateral renal vascular calcifications.  No renal, ureteral, or bladder calculi.  No hydronephrosis.  Bladder is mildly thick-walled but underdistended.  Stomach/Bowel: Stomach is unremarkable.  No evidence of bowel obstruction.  Normal appendix.  Vascular/Lymphatic: Atherosclerotic calcifications of the abdominal aorta and branch vessels.  No suspicious abdominopelvic lymphadenopathy.  Reproductive: Uterus is unremarkable.  Bilateral ovaries are within normal limits.  Other: No abdominopelvic ascites.  Musculoskeletal: Visualized osseous structures are within normal limits.  IMPRESSION: No evidence of bowel obstruction.  Normal appendix.  No CT findings to account for the patient's left lower quadrant abdominal pain.   Electronically Signed   By: Julian Hy M.D.   On: 05/24/2014 01:36     EKG Interpretation None      10:11 PM Pt declines pain and nausea medication  Pt does not make much urine and refused in and out cath. 1:50 AM Discussed this with patient and she continues to refuse.  She is aware that she may have an undiagnosed urinary infection but declines to stay for UA.    MDM   Final diagnoses:  Left flank pain    Afebrile,  nontoxic patient with left sided abdominal and flank pain that began today.  Associated nausea.  No other associated symptoms.  Labs reflect chronic renal failure, no other significant changes.  Pt is not pregnant.  CT abd/pelvis (with protocol PO contrast for dialysis patients) negative.  D/C home with norco, zofran, PCP follow up.   Discussed result, findings, treatment, and follow up  with patient.  Pt given return precautions.  Pt verbalizes understanding and agrees with plan.         Clayton Bibles, PA-C 05/24/14 0144  Clayton Bibles, PA-C 05/24/14 0150  Evelina Bucy, MD 05/24/14 [phone]

## 2014-05-23 NOTE — ED Notes (Signed)
Pt arrived to the ED with a complaint of left flank pain.  Pt states that she is a dialysis patient.  Pt has dialysis today.  Pt emerged on Thursday last week and went away.  Pt resurfaced today.  Pain stators in lower left pelvic area and extends to upper left abdomen and radiates to her back.

## 2014-05-24 DIAGNOSIS — R1032 Left lower quadrant pain: Secondary | ICD-10-CM | POA: Diagnosis not present

## 2014-05-24 MED ORDER — HYDROCODONE-ACETAMINOPHEN 5-325 MG PO TABS: 1.0000 | ORAL_TABLET | Freq: Four times a day (QID) | ORAL | Status: DC | PRN

## 2014-05-24 MED ORDER — ONDANSETRON HCL 4 MG PO TABS: 4.0000 mg | ORAL_TABLET | Freq: Three times a day (TID) | ORAL | Status: DC | PRN

## 2014-05-24 NOTE — ED Notes (Addendum)
Attempted to obtain urine specimen pt states "I am not  peeing  in the cup and you are not going to  in and out cath me if you can't find it by the CT scan or lab work it aint going to happen" RN and MD notifited

## 2014-05-24 NOTE — Discharge Instructions (Signed)
Read the information below.  Use the prescribed medication as directed.  Please discuss all new medications with your pharmacist.  Do not take additional tylenol while taking the prescribed pain medication to avoid overdose.  You may return to the Emergency Department at any time for worsening condition or any new symptoms that concern you.  If you develop high fevers, worsening abdominal pain, uncontrolled vomiting, or are unable to tolerate fluids by mouth, return to the ER for a recheck.    Abdominal Pain Many things can cause abdominal pain. Usually, abdominal pain is not caused by a disease and will improve without treatment. It can often be observed and treated at home. Your health care provider will do a physical exam and possibly order blood tests and X-rays to help determine the seriousness of your pain. However, in many cases, more time must pass before a clear cause of the pain can be found. Before that point, your health care provider may not know if you need more testing or further treatment. HOME CARE INSTRUCTIONS  Monitor your abdominal pain for any changes. The following actions may help to alleviate any discomfort you are experiencing:  Only take over-the-counter or prescription medicines as directed by your health care provider.  Do not take laxatives unless directed to do so by your health care provider.  Try a clear liquid diet (broth, tea, or water) as directed by your health care provider. Slowly move to a bland diet as tolerated. SEEK MEDICAL CARE IF:  You have unexplained abdominal pain.  You have abdominal pain associated with nausea or diarrhea.  You have pain when you urinate or have a bowel movement.  You experience abdominal pain that wakes you in the night.  You have abdominal pain that is worsened or improved by eating food.  You have abdominal pain that is worsened with eating fatty foods.  You have a fever. SEEK IMMEDIATE MEDICAL CARE IF:   Your pain  does not go away within 2 hours.  You keep throwing up (vomiting).  Your pain is felt only in portions of the abdomen, such as the right side or the left lower portion of the abdomen.  You pass bloody or black tarry stools. MAKE SURE YOU:  Understand these instructions.   Will watch your condition.   Will get help right away if you are not doing well or get worse.  Document Released: 02/20/2005 Document Revised: 05/18/2013 Document Reviewed: 01/20/2013 [facility] Patient Information 2015 [place], [place]. This information is not intended to replace advice given to you by your health care provider. Make sure you discuss any questions you have with your health care provider.  Flank Pain Flank pain refers to pain that is located on the side of the body between the upper abdomen and the back. The pain may occur over a short period of time (acute) or may be long-term or reoccurring (chronic). It may be mild or severe. Flank pain can be caused by many things. CAUSES  Some of the more common causes of flank pain include:  Muscle strains.   Muscle spasms.   A disease of your spine (vertebral disk disease).   A lung infection (pneumonia).   Fluid around your lungs (pulmonary edema).   A kidney infection.   Kidney stones.   A very painful skin rash caused by the chickenpox virus (shingles).   Gallbladder disease.  [place] care will depend on the cause of your pain. In general,  Rest as  directed by your caregiver.  Drink enough fluids to keep your urine clear or pale yellow.  Only take over-the-counter or prescription medicines as directed by your caregiver. Some medicines may help relieve the pain.  Tell your caregiver about any changes in your pain.  Follow up with your caregiver as directed. SEEK IMMEDIATE MEDICAL CARE IF:   Your pain is not controlled with medicine.   You have new or worsening symptoms.  Your pain increases.   You  have abdominal pain.   You have shortness of breath.   You have persistent nausea or vomiting.   You have swelling in your abdomen.   You feel faint or pass out.   You have blood in your urine.  You have a fever or persistent symptoms for more than 2-3 days.  You have a fever and your symptoms suddenly get worse. MAKE SURE YOU:   Understand these instructions.  Will watch your condition.  Will get help right away if you are not doing well or get worse. Document Released: 07/04/2005 Document Revised: 02/05/2012 Document Reviewed: 12/26/2011 [facility] Patient Information 2015 [place], [place]. This information is not intended to replace advice given to you by your health care provider. Make sure you discuss any questions you have with your health care provider.

## 2014-05-24 NOTE — ED Notes (Signed)
Per Marcella NT pt refused to provide urine specimen and refused to be cathed as ordered.  Janeth Rase EDPA notified.

## 2014-05-25 DIAGNOSIS — D509 Iron deficiency anemia, unspecified: Secondary | ICD-10-CM | POA: Diagnosis not present

## 2014-05-25 DIAGNOSIS — D631 Anemia in chronic kidney disease: Secondary | ICD-10-CM | POA: Diagnosis not present

## 2014-05-25 DIAGNOSIS — N2581 Secondary hyperparathyroidism of renal origin: Secondary | ICD-10-CM | POA: Diagnosis not present

## 2014-05-25 DIAGNOSIS — E1129 Type 2 diabetes mellitus with other diabetic kidney complication: Secondary | ICD-10-CM | POA: Diagnosis not present

## 2014-05-25 DIAGNOSIS — R109 Unspecified abdominal pain: Secondary | ICD-10-CM | POA: Diagnosis not present

## 2014-05-25 DIAGNOSIS — N186 End stage renal disease: Secondary | ICD-10-CM | POA: Diagnosis not present

## 2014-05-26 DIAGNOSIS — N186 End stage renal disease: Secondary | ICD-10-CM | POA: Diagnosis not present

## 2014-05-26 DIAGNOSIS — Z992 Dependence on renal dialysis: Secondary | ICD-10-CM | POA: Diagnosis not present

## 2014-05-27 DIAGNOSIS — S92353A Displaced fracture of fifth metatarsal bone, unspecified foot, initial encounter for closed fracture: Secondary | ICD-10-CM

## 2014-05-27 HISTORY — DX: Displaced fracture of fifth metatarsal bone, unspecified foot, initial encounter for closed fracture: S92.353A

## 2014-05-28 DIAGNOSIS — E1129 Type 2 diabetes mellitus with other diabetic kidney complication: Secondary | ICD-10-CM | POA: Diagnosis not present

## 2014-05-28 DIAGNOSIS — D631 Anemia in chronic kidney disease: Secondary | ICD-10-CM | POA: Diagnosis not present

## 2014-05-28 DIAGNOSIS — N186 End stage renal disease: Secondary | ICD-10-CM | POA: Diagnosis not present

## 2014-05-28 DIAGNOSIS — N2581 Secondary hyperparathyroidism of renal origin: Secondary | ICD-10-CM | POA: Diagnosis not present

## 2014-05-28 DIAGNOSIS — D509 Iron deficiency anemia, unspecified: Secondary | ICD-10-CM | POA: Diagnosis not present

## 2014-05-30 DIAGNOSIS — N186 End stage renal disease: Secondary | ICD-10-CM | POA: Diagnosis not present

## 2014-05-30 DIAGNOSIS — D509 Iron deficiency anemia, unspecified: Secondary | ICD-10-CM | POA: Diagnosis not present

## 2014-05-30 DIAGNOSIS — N2581 Secondary hyperparathyroidism of renal origin: Secondary | ICD-10-CM | POA: Diagnosis not present

## 2014-05-30 DIAGNOSIS — E1129 Type 2 diabetes mellitus with other diabetic kidney complication: Secondary | ICD-10-CM | POA: Diagnosis not present

## 2014-05-30 DIAGNOSIS — D631 Anemia in chronic kidney disease: Secondary | ICD-10-CM | POA: Diagnosis not present

## 2014-05-31 DIAGNOSIS — S92351A Displaced fracture of fifth metatarsal bone, right foot, initial encounter for closed fracture: Secondary | ICD-10-CM | POA: Diagnosis not present

## 2014-06-01 DIAGNOSIS — D631 Anemia in chronic kidney disease: Secondary | ICD-10-CM | POA: Diagnosis not present

## 2014-06-01 DIAGNOSIS — N186 End stage renal disease: Secondary | ICD-10-CM | POA: Diagnosis not present

## 2014-06-01 DIAGNOSIS — D509 Iron deficiency anemia, unspecified: Secondary | ICD-10-CM | POA: Diagnosis not present

## 2014-06-01 DIAGNOSIS — E1129 Type 2 diabetes mellitus with other diabetic kidney complication: Secondary | ICD-10-CM | POA: Diagnosis not present

## 2014-06-01 DIAGNOSIS — N2581 Secondary hyperparathyroidism of renal origin: Secondary | ICD-10-CM | POA: Diagnosis not present

## 2014-06-03 DIAGNOSIS — D631 Anemia in chronic kidney disease: Secondary | ICD-10-CM | POA: Diagnosis not present

## 2014-06-03 DIAGNOSIS — E1129 Type 2 diabetes mellitus with other diabetic kidney complication: Secondary | ICD-10-CM | POA: Diagnosis not present

## 2014-06-03 DIAGNOSIS — D509 Iron deficiency anemia, unspecified: Secondary | ICD-10-CM | POA: Diagnosis not present

## 2014-06-03 DIAGNOSIS — N2581 Secondary hyperparathyroidism of renal origin: Secondary | ICD-10-CM | POA: Diagnosis not present

## 2014-06-03 DIAGNOSIS — N186 End stage renal disease: Secondary | ICD-10-CM | POA: Diagnosis not present

## 2014-06-06 DIAGNOSIS — D631 Anemia in chronic kidney disease: Secondary | ICD-10-CM | POA: Diagnosis not present

## 2014-06-06 DIAGNOSIS — N2581 Secondary hyperparathyroidism of renal origin: Secondary | ICD-10-CM | POA: Diagnosis not present

## 2014-06-06 DIAGNOSIS — D509 Iron deficiency anemia, unspecified: Secondary | ICD-10-CM | POA: Diagnosis not present

## 2014-06-06 DIAGNOSIS — E1129 Type 2 diabetes mellitus with other diabetic kidney complication: Secondary | ICD-10-CM | POA: Diagnosis not present

## 2014-06-06 DIAGNOSIS — N186 End stage renal disease: Secondary | ICD-10-CM | POA: Diagnosis not present

## 2014-06-08 DIAGNOSIS — N2581 Secondary hyperparathyroidism of renal origin: Secondary | ICD-10-CM | POA: Diagnosis not present

## 2014-06-08 DIAGNOSIS — N186 End stage renal disease: Secondary | ICD-10-CM | POA: Diagnosis not present

## 2014-06-08 DIAGNOSIS — D631 Anemia in chronic kidney disease: Secondary | ICD-10-CM | POA: Diagnosis not present

## 2014-06-08 DIAGNOSIS — E1129 Type 2 diabetes mellitus with other diabetic kidney complication: Secondary | ICD-10-CM | POA: Diagnosis not present

## 2014-06-08 DIAGNOSIS — D509 Iron deficiency anemia, unspecified: Secondary | ICD-10-CM | POA: Diagnosis not present

## 2014-06-10 DIAGNOSIS — N186 End stage renal disease: Secondary | ICD-10-CM | POA: Diagnosis not present

## 2014-06-10 DIAGNOSIS — N2581 Secondary hyperparathyroidism of renal origin: Secondary | ICD-10-CM | POA: Diagnosis not present

## 2014-06-10 DIAGNOSIS — D509 Iron deficiency anemia, unspecified: Secondary | ICD-10-CM | POA: Diagnosis not present

## 2014-06-10 DIAGNOSIS — D631 Anemia in chronic kidney disease: Secondary | ICD-10-CM | POA: Diagnosis not present

## 2014-06-10 DIAGNOSIS — E1129 Type 2 diabetes mellitus with other diabetic kidney complication: Secondary | ICD-10-CM | POA: Diagnosis not present

## 2014-06-13 DIAGNOSIS — N2581 Secondary hyperparathyroidism of renal origin: Secondary | ICD-10-CM | POA: Diagnosis not present

## 2014-06-13 DIAGNOSIS — N186 End stage renal disease: Secondary | ICD-10-CM | POA: Diagnosis not present

## 2014-06-13 DIAGNOSIS — D509 Iron deficiency anemia, unspecified: Secondary | ICD-10-CM | POA: Diagnosis not present

## 2014-06-13 DIAGNOSIS — D631 Anemia in chronic kidney disease: Secondary | ICD-10-CM | POA: Diagnosis not present

## 2014-06-13 DIAGNOSIS — E1129 Type 2 diabetes mellitus with other diabetic kidney complication: Secondary | ICD-10-CM | POA: Diagnosis not present

## 2014-06-15 DIAGNOSIS — N2581 Secondary hyperparathyroidism of renal origin: Secondary | ICD-10-CM | POA: Diagnosis not present

## 2014-06-15 DIAGNOSIS — N186 End stage renal disease: Secondary | ICD-10-CM | POA: Diagnosis not present

## 2014-06-15 DIAGNOSIS — D509 Iron deficiency anemia, unspecified: Secondary | ICD-10-CM | POA: Diagnosis not present

## 2014-06-15 DIAGNOSIS — D631 Anemia in chronic kidney disease: Secondary | ICD-10-CM | POA: Diagnosis not present

## 2014-06-15 DIAGNOSIS — E1129 Type 2 diabetes mellitus with other diabetic kidney complication: Secondary | ICD-10-CM | POA: Diagnosis not present

## 2014-06-16 DIAGNOSIS — J322 Chronic ethmoidal sinusitis: Secondary | ICD-10-CM | POA: Diagnosis not present

## 2014-06-16 DIAGNOSIS — H6062 Unspecified chronic otitis externa, left ear: Secondary | ICD-10-CM | POA: Diagnosis not present

## 2014-06-16 DIAGNOSIS — H6502 Acute serous otitis media, left ear: Secondary | ICD-10-CM | POA: Diagnosis not present

## 2014-06-16 DIAGNOSIS — J410 Simple chronic bronchitis: Secondary | ICD-10-CM | POA: Diagnosis not present

## 2014-06-16 DIAGNOSIS — J040 Acute laryngitis: Secondary | ICD-10-CM | POA: Diagnosis not present

## 2014-06-16 DIAGNOSIS — H6522 Chronic serous otitis media, left ear: Secondary | ICD-10-CM | POA: Diagnosis not present

## 2014-06-16 DIAGNOSIS — J320 Chronic maxillary sinusitis: Secondary | ICD-10-CM | POA: Diagnosis not present

## 2014-06-17 DIAGNOSIS — E1129 Type 2 diabetes mellitus with other diabetic kidney complication: Secondary | ICD-10-CM | POA: Diagnosis not present

## 2014-06-17 DIAGNOSIS — N2581 Secondary hyperparathyroidism of renal origin: Secondary | ICD-10-CM | POA: Diagnosis not present

## 2014-06-17 DIAGNOSIS — N186 End stage renal disease: Secondary | ICD-10-CM | POA: Diagnosis not present

## 2014-06-17 DIAGNOSIS — D631 Anemia in chronic kidney disease: Secondary | ICD-10-CM | POA: Diagnosis not present

## 2014-06-17 DIAGNOSIS — D509 Iron deficiency anemia, unspecified: Secondary | ICD-10-CM | POA: Diagnosis not present

## 2014-06-20 DIAGNOSIS — N186 End stage renal disease: Secondary | ICD-10-CM | POA: Diagnosis not present

## 2014-06-20 DIAGNOSIS — D509 Iron deficiency anemia, unspecified: Secondary | ICD-10-CM | POA: Diagnosis not present

## 2014-06-20 DIAGNOSIS — D631 Anemia in chronic kidney disease: Secondary | ICD-10-CM | POA: Diagnosis not present

## 2014-06-20 DIAGNOSIS — N2581 Secondary hyperparathyroidism of renal origin: Secondary | ICD-10-CM | POA: Diagnosis not present

## 2014-06-20 DIAGNOSIS — E1129 Type 2 diabetes mellitus with other diabetic kidney complication: Secondary | ICD-10-CM | POA: Diagnosis not present

## 2014-06-21 DIAGNOSIS — S92351D Displaced fracture of fifth metatarsal bone, right foot, subsequent encounter for fracture with routine healing: Secondary | ICD-10-CM | POA: Diagnosis not present

## 2014-06-22 DIAGNOSIS — N186 End stage renal disease: Secondary | ICD-10-CM | POA: Diagnosis not present

## 2014-06-22 DIAGNOSIS — D631 Anemia in chronic kidney disease: Secondary | ICD-10-CM | POA: Diagnosis not present

## 2014-06-22 DIAGNOSIS — E1129 Type 2 diabetes mellitus with other diabetic kidney complication: Secondary | ICD-10-CM | POA: Diagnosis not present

## 2014-06-22 DIAGNOSIS — D509 Iron deficiency anemia, unspecified: Secondary | ICD-10-CM | POA: Diagnosis not present

## 2014-06-22 DIAGNOSIS — N2581 Secondary hyperparathyroidism of renal origin: Secondary | ICD-10-CM | POA: Diagnosis not present

## 2014-06-23 DIAGNOSIS — H6522 Chronic serous otitis media, left ear: Secondary | ICD-10-CM | POA: Diagnosis not present

## 2014-06-24 DIAGNOSIS — N2581 Secondary hyperparathyroidism of renal origin: Secondary | ICD-10-CM | POA: Diagnosis not present

## 2014-06-24 DIAGNOSIS — N186 End stage renal disease: Secondary | ICD-10-CM | POA: Diagnosis not present

## 2014-06-24 DIAGNOSIS — E1129 Type 2 diabetes mellitus with other diabetic kidney complication: Secondary | ICD-10-CM | POA: Diagnosis not present

## 2014-06-24 DIAGNOSIS — D631 Anemia in chronic kidney disease: Secondary | ICD-10-CM | POA: Diagnosis not present

## 2014-06-24 DIAGNOSIS — D509 Iron deficiency anemia, unspecified: Secondary | ICD-10-CM | POA: Diagnosis not present

## 2014-06-26 DIAGNOSIS — Z992 Dependence on renal dialysis: Secondary | ICD-10-CM | POA: Diagnosis not present

## 2014-06-26 DIAGNOSIS — N186 End stage renal disease: Secondary | ICD-10-CM | POA: Diagnosis not present

## 2014-06-27 DIAGNOSIS — N2581 Secondary hyperparathyroidism of renal origin: Secondary | ICD-10-CM | POA: Diagnosis not present

## 2014-06-27 DIAGNOSIS — E1129 Type 2 diabetes mellitus with other diabetic kidney complication: Secondary | ICD-10-CM | POA: Diagnosis not present

## 2014-06-27 DIAGNOSIS — N186 End stage renal disease: Secondary | ICD-10-CM | POA: Diagnosis not present

## 2014-06-27 DIAGNOSIS — D509 Iron deficiency anemia, unspecified: Secondary | ICD-10-CM | POA: Diagnosis not present

## 2014-06-27 DIAGNOSIS — D631 Anemia in chronic kidney disease: Secondary | ICD-10-CM | POA: Diagnosis not present

## 2014-06-29 DIAGNOSIS — E1129 Type 2 diabetes mellitus with other diabetic kidney complication: Secondary | ICD-10-CM | POA: Diagnosis not present

## 2014-06-29 DIAGNOSIS — D509 Iron deficiency anemia, unspecified: Secondary | ICD-10-CM | POA: Diagnosis not present

## 2014-06-29 DIAGNOSIS — N2581 Secondary hyperparathyroidism of renal origin: Secondary | ICD-10-CM | POA: Diagnosis not present

## 2014-06-29 DIAGNOSIS — N186 End stage renal disease: Secondary | ICD-10-CM | POA: Diagnosis not present

## 2014-06-29 DIAGNOSIS — D631 Anemia in chronic kidney disease: Secondary | ICD-10-CM | POA: Diagnosis not present

## 2014-07-01 DIAGNOSIS — N2581 Secondary hyperparathyroidism of renal origin: Secondary | ICD-10-CM | POA: Diagnosis not present

## 2014-07-01 DIAGNOSIS — D631 Anemia in chronic kidney disease: Secondary | ICD-10-CM | POA: Diagnosis not present

## 2014-07-01 DIAGNOSIS — D509 Iron deficiency anemia, unspecified: Secondary | ICD-10-CM | POA: Diagnosis not present

## 2014-07-01 DIAGNOSIS — E1129 Type 2 diabetes mellitus with other diabetic kidney complication: Secondary | ICD-10-CM | POA: Diagnosis not present

## 2014-07-01 DIAGNOSIS — N186 End stage renal disease: Secondary | ICD-10-CM | POA: Diagnosis not present

## 2014-07-04 DIAGNOSIS — D631 Anemia in chronic kidney disease: Secondary | ICD-10-CM | POA: Diagnosis not present

## 2014-07-04 DIAGNOSIS — N2581 Secondary hyperparathyroidism of renal origin: Secondary | ICD-10-CM | POA: Diagnosis not present

## 2014-07-04 DIAGNOSIS — E1129 Type 2 diabetes mellitus with other diabetic kidney complication: Secondary | ICD-10-CM | POA: Diagnosis not present

## 2014-07-04 DIAGNOSIS — N186 End stage renal disease: Secondary | ICD-10-CM | POA: Diagnosis not present

## 2014-07-04 DIAGNOSIS — D509 Iron deficiency anemia, unspecified: Secondary | ICD-10-CM | POA: Diagnosis not present

## 2014-07-06 DIAGNOSIS — N2581 Secondary hyperparathyroidism of renal origin: Secondary | ICD-10-CM | POA: Diagnosis not present

## 2014-07-06 DIAGNOSIS — D631 Anemia in chronic kidney disease: Secondary | ICD-10-CM | POA: Diagnosis not present

## 2014-07-06 DIAGNOSIS — D509 Iron deficiency anemia, unspecified: Secondary | ICD-10-CM | POA: Diagnosis not present

## 2014-07-06 DIAGNOSIS — N186 End stage renal disease: Secondary | ICD-10-CM | POA: Diagnosis not present

## 2014-07-06 DIAGNOSIS — E1129 Type 2 diabetes mellitus with other diabetic kidney complication: Secondary | ICD-10-CM | POA: Diagnosis not present

## 2014-07-08 DIAGNOSIS — E1129 Type 2 diabetes mellitus with other diabetic kidney complication: Secondary | ICD-10-CM | POA: Diagnosis not present

## 2014-07-08 DIAGNOSIS — N2581 Secondary hyperparathyroidism of renal origin: Secondary | ICD-10-CM | POA: Diagnosis not present

## 2014-07-08 DIAGNOSIS — D631 Anemia in chronic kidney disease: Secondary | ICD-10-CM | POA: Diagnosis not present

## 2014-07-08 DIAGNOSIS — D509 Iron deficiency anemia, unspecified: Secondary | ICD-10-CM | POA: Diagnosis not present

## 2014-07-08 DIAGNOSIS — N186 End stage renal disease: Secondary | ICD-10-CM | POA: Diagnosis not present

## 2014-07-11 DIAGNOSIS — D509 Iron deficiency anemia, unspecified: Secondary | ICD-10-CM | POA: Diagnosis not present

## 2014-07-11 DIAGNOSIS — N186 End stage renal disease: Secondary | ICD-10-CM | POA: Diagnosis not present

## 2014-07-11 DIAGNOSIS — D631 Anemia in chronic kidney disease: Secondary | ICD-10-CM | POA: Diagnosis not present

## 2014-07-11 DIAGNOSIS — N2581 Secondary hyperparathyroidism of renal origin: Secondary | ICD-10-CM | POA: Diagnosis not present

## 2014-07-11 DIAGNOSIS — E1129 Type 2 diabetes mellitus with other diabetic kidney complication: Secondary | ICD-10-CM | POA: Diagnosis not present

## 2014-07-12 DIAGNOSIS — H6522 Chronic serous otitis media, left ear: Secondary | ICD-10-CM | POA: Diagnosis not present

## 2014-07-13 DIAGNOSIS — E1129 Type 2 diabetes mellitus with other diabetic kidney complication: Secondary | ICD-10-CM | POA: Diagnosis not present

## 2014-07-13 DIAGNOSIS — D509 Iron deficiency anemia, unspecified: Secondary | ICD-10-CM | POA: Diagnosis not present

## 2014-07-13 DIAGNOSIS — N2581 Secondary hyperparathyroidism of renal origin: Secondary | ICD-10-CM | POA: Diagnosis not present

## 2014-07-13 DIAGNOSIS — D631 Anemia in chronic kidney disease: Secondary | ICD-10-CM | POA: Diagnosis not present

## 2014-07-13 DIAGNOSIS — N186 End stage renal disease: Secondary | ICD-10-CM | POA: Diagnosis not present

## 2014-07-14 DIAGNOSIS — S92351D Displaced fracture of fifth metatarsal bone, right foot, subsequent encounter for fracture with routine healing: Secondary | ICD-10-CM | POA: Diagnosis not present

## 2014-07-15 DIAGNOSIS — N186 End stage renal disease: Secondary | ICD-10-CM | POA: Diagnosis not present

## 2014-07-15 DIAGNOSIS — E1129 Type 2 diabetes mellitus with other diabetic kidney complication: Secondary | ICD-10-CM | POA: Diagnosis not present

## 2014-07-15 DIAGNOSIS — N2581 Secondary hyperparathyroidism of renal origin: Secondary | ICD-10-CM | POA: Diagnosis not present

## 2014-07-15 DIAGNOSIS — D509 Iron deficiency anemia, unspecified: Secondary | ICD-10-CM | POA: Diagnosis not present

## 2014-07-15 DIAGNOSIS — D631 Anemia in chronic kidney disease: Secondary | ICD-10-CM | POA: Diagnosis not present

## 2014-07-18 DIAGNOSIS — D509 Iron deficiency anemia, unspecified: Secondary | ICD-10-CM | POA: Diagnosis not present

## 2014-07-18 DIAGNOSIS — N2581 Secondary hyperparathyroidism of renal origin: Secondary | ICD-10-CM | POA: Diagnosis not present

## 2014-07-18 DIAGNOSIS — N186 End stage renal disease: Secondary | ICD-10-CM | POA: Diagnosis not present

## 2014-07-18 DIAGNOSIS — E1129 Type 2 diabetes mellitus with other diabetic kidney complication: Secondary | ICD-10-CM | POA: Diagnosis not present

## 2014-07-18 DIAGNOSIS — D631 Anemia in chronic kidney disease: Secondary | ICD-10-CM | POA: Diagnosis not present

## 2014-07-20 DIAGNOSIS — N186 End stage renal disease: Secondary | ICD-10-CM | POA: Diagnosis not present

## 2014-07-20 DIAGNOSIS — E1129 Type 2 diabetes mellitus with other diabetic kidney complication: Secondary | ICD-10-CM | POA: Diagnosis not present

## 2014-07-20 DIAGNOSIS — D631 Anemia in chronic kidney disease: Secondary | ICD-10-CM | POA: Diagnosis not present

## 2014-07-20 DIAGNOSIS — D509 Iron deficiency anemia, unspecified: Secondary | ICD-10-CM | POA: Diagnosis not present

## 2014-07-20 DIAGNOSIS — N2581 Secondary hyperparathyroidism of renal origin: Secondary | ICD-10-CM | POA: Diagnosis not present

## 2014-07-22 DIAGNOSIS — N186 End stage renal disease: Secondary | ICD-10-CM | POA: Diagnosis not present

## 2014-07-22 DIAGNOSIS — D509 Iron deficiency anemia, unspecified: Secondary | ICD-10-CM | POA: Diagnosis not present

## 2014-07-22 DIAGNOSIS — E1129 Type 2 diabetes mellitus with other diabetic kidney complication: Secondary | ICD-10-CM | POA: Diagnosis not present

## 2014-07-22 DIAGNOSIS — N2581 Secondary hyperparathyroidism of renal origin: Secondary | ICD-10-CM | POA: Diagnosis not present

## 2014-07-22 DIAGNOSIS — D631 Anemia in chronic kidney disease: Secondary | ICD-10-CM | POA: Diagnosis not present

## 2014-07-25 DIAGNOSIS — Z992 Dependence on renal dialysis: Secondary | ICD-10-CM | POA: Diagnosis not present

## 2014-07-25 DIAGNOSIS — D509 Iron deficiency anemia, unspecified: Secondary | ICD-10-CM | POA: Diagnosis not present

## 2014-07-25 DIAGNOSIS — N2581 Secondary hyperparathyroidism of renal origin: Secondary | ICD-10-CM | POA: Diagnosis not present

## 2014-07-25 DIAGNOSIS — D631 Anemia in chronic kidney disease: Secondary | ICD-10-CM | POA: Diagnosis not present

## 2014-07-25 DIAGNOSIS — E1129 Type 2 diabetes mellitus with other diabetic kidney complication: Secondary | ICD-10-CM | POA: Diagnosis not present

## 2014-07-25 DIAGNOSIS — N186 End stage renal disease: Secondary | ICD-10-CM | POA: Diagnosis not present

## 2014-07-27 DIAGNOSIS — N2581 Secondary hyperparathyroidism of renal origin: Secondary | ICD-10-CM | POA: Diagnosis not present

## 2014-07-27 DIAGNOSIS — N186 End stage renal disease: Secondary | ICD-10-CM | POA: Diagnosis not present

## 2014-07-27 DIAGNOSIS — E1129 Type 2 diabetes mellitus with other diabetic kidney complication: Secondary | ICD-10-CM | POA: Diagnosis not present

## 2014-07-27 DIAGNOSIS — D631 Anemia in chronic kidney disease: Secondary | ICD-10-CM | POA: Diagnosis not present

## 2014-07-27 DIAGNOSIS — D509 Iron deficiency anemia, unspecified: Secondary | ICD-10-CM | POA: Diagnosis not present

## 2014-07-29 DIAGNOSIS — E1129 Type 2 diabetes mellitus with other diabetic kidney complication: Secondary | ICD-10-CM | POA: Diagnosis not present

## 2014-07-29 DIAGNOSIS — N186 End stage renal disease: Secondary | ICD-10-CM | POA: Diagnosis not present

## 2014-07-29 DIAGNOSIS — D509 Iron deficiency anemia, unspecified: Secondary | ICD-10-CM | POA: Diagnosis not present

## 2014-07-29 DIAGNOSIS — N2581 Secondary hyperparathyroidism of renal origin: Secondary | ICD-10-CM | POA: Diagnosis not present

## 2014-07-29 DIAGNOSIS — D631 Anemia in chronic kidney disease: Secondary | ICD-10-CM | POA: Diagnosis not present

## 2014-08-01 DIAGNOSIS — D631 Anemia in chronic kidney disease: Secondary | ICD-10-CM | POA: Diagnosis not present

## 2014-08-01 DIAGNOSIS — N186 End stage renal disease: Secondary | ICD-10-CM | POA: Diagnosis not present

## 2014-08-01 DIAGNOSIS — N2581 Secondary hyperparathyroidism of renal origin: Secondary | ICD-10-CM | POA: Diagnosis not present

## 2014-08-01 DIAGNOSIS — E1129 Type 2 diabetes mellitus with other diabetic kidney complication: Secondary | ICD-10-CM | POA: Diagnosis not present

## 2014-08-01 DIAGNOSIS — D509 Iron deficiency anemia, unspecified: Secondary | ICD-10-CM | POA: Diagnosis not present

## 2014-08-03 DIAGNOSIS — D631 Anemia in chronic kidney disease: Secondary | ICD-10-CM | POA: Diagnosis not present

## 2014-08-03 DIAGNOSIS — D509 Iron deficiency anemia, unspecified: Secondary | ICD-10-CM | POA: Diagnosis not present

## 2014-08-03 DIAGNOSIS — N186 End stage renal disease: Secondary | ICD-10-CM | POA: Diagnosis not present

## 2014-08-03 DIAGNOSIS — N2581 Secondary hyperparathyroidism of renal origin: Secondary | ICD-10-CM | POA: Diagnosis not present

## 2014-08-03 DIAGNOSIS — E1129 Type 2 diabetes mellitus with other diabetic kidney complication: Secondary | ICD-10-CM | POA: Diagnosis not present

## 2014-08-04 DIAGNOSIS — E1065 Type 1 diabetes mellitus with hyperglycemia: Secondary | ICD-10-CM | POA: Diagnosis not present

## 2014-08-04 DIAGNOSIS — E782 Mixed hyperlipidemia: Secondary | ICD-10-CM | POA: Diagnosis not present

## 2014-08-05 DIAGNOSIS — N2581 Secondary hyperparathyroidism of renal origin: Secondary | ICD-10-CM | POA: Diagnosis not present

## 2014-08-05 DIAGNOSIS — D509 Iron deficiency anemia, unspecified: Secondary | ICD-10-CM | POA: Diagnosis not present

## 2014-08-05 DIAGNOSIS — E1129 Type 2 diabetes mellitus with other diabetic kidney complication: Secondary | ICD-10-CM | POA: Diagnosis not present

## 2014-08-05 DIAGNOSIS — D631 Anemia in chronic kidney disease: Secondary | ICD-10-CM | POA: Diagnosis not present

## 2014-08-05 DIAGNOSIS — N186 End stage renal disease: Secondary | ICD-10-CM | POA: Diagnosis not present

## 2014-08-08 DIAGNOSIS — N186 End stage renal disease: Secondary | ICD-10-CM | POA: Diagnosis not present

## 2014-08-08 DIAGNOSIS — D631 Anemia in chronic kidney disease: Secondary | ICD-10-CM | POA: Diagnosis not present

## 2014-08-08 DIAGNOSIS — D509 Iron deficiency anemia, unspecified: Secondary | ICD-10-CM | POA: Diagnosis not present

## 2014-08-08 DIAGNOSIS — N2581 Secondary hyperparathyroidism of renal origin: Secondary | ICD-10-CM | POA: Diagnosis not present

## 2014-08-08 DIAGNOSIS — E1129 Type 2 diabetes mellitus with other diabetic kidney complication: Secondary | ICD-10-CM | POA: Diagnosis not present

## 2014-08-10 DIAGNOSIS — D509 Iron deficiency anemia, unspecified: Secondary | ICD-10-CM | POA: Diagnosis not present

## 2014-08-10 DIAGNOSIS — E1129 Type 2 diabetes mellitus with other diabetic kidney complication: Secondary | ICD-10-CM | POA: Diagnosis not present

## 2014-08-10 DIAGNOSIS — N186 End stage renal disease: Secondary | ICD-10-CM | POA: Diagnosis not present

## 2014-08-10 DIAGNOSIS — N2581 Secondary hyperparathyroidism of renal origin: Secondary | ICD-10-CM | POA: Diagnosis not present

## 2014-08-10 DIAGNOSIS — D631 Anemia in chronic kidney disease: Secondary | ICD-10-CM | POA: Diagnosis not present

## 2014-08-12 DIAGNOSIS — D631 Anemia in chronic kidney disease: Secondary | ICD-10-CM | POA: Diagnosis not present

## 2014-08-12 DIAGNOSIS — E1129 Type 2 diabetes mellitus with other diabetic kidney complication: Secondary | ICD-10-CM | POA: Diagnosis not present

## 2014-08-12 DIAGNOSIS — N186 End stage renal disease: Secondary | ICD-10-CM | POA: Diagnosis not present

## 2014-08-12 DIAGNOSIS — D509 Iron deficiency anemia, unspecified: Secondary | ICD-10-CM | POA: Diagnosis not present

## 2014-08-12 DIAGNOSIS — N2581 Secondary hyperparathyroidism of renal origin: Secondary | ICD-10-CM | POA: Diagnosis not present

## 2014-08-15 DIAGNOSIS — D509 Iron deficiency anemia, unspecified: Secondary | ICD-10-CM | POA: Diagnosis not present

## 2014-08-15 DIAGNOSIS — D631 Anemia in chronic kidney disease: Secondary | ICD-10-CM | POA: Diagnosis not present

## 2014-08-15 DIAGNOSIS — N2581 Secondary hyperparathyroidism of renal origin: Secondary | ICD-10-CM | POA: Diagnosis not present

## 2014-08-15 DIAGNOSIS — E1129 Type 2 diabetes mellitus with other diabetic kidney complication: Secondary | ICD-10-CM | POA: Diagnosis not present

## 2014-08-15 DIAGNOSIS — N186 End stage renal disease: Secondary | ICD-10-CM | POA: Diagnosis not present

## 2014-08-16 DIAGNOSIS — M79671 Pain in right foot: Secondary | ICD-10-CM | POA: Diagnosis not present

## 2014-08-16 DIAGNOSIS — I10 Essential (primary) hypertension: Secondary | ICD-10-CM | POA: Diagnosis not present

## 2014-08-16 DIAGNOSIS — E782 Mixed hyperlipidemia: Secondary | ICD-10-CM | POA: Diagnosis not present

## 2014-08-16 DIAGNOSIS — E1021 Type 1 diabetes mellitus with diabetic nephropathy: Secondary | ICD-10-CM | POA: Diagnosis not present

## 2014-08-16 DIAGNOSIS — N186 End stage renal disease: Secondary | ICD-10-CM | POA: Diagnosis not present

## 2014-08-16 DIAGNOSIS — S92351D Displaced fracture of fifth metatarsal bone, right foot, subsequent encounter for fracture with routine healing: Secondary | ICD-10-CM | POA: Diagnosis not present

## 2014-08-17 DIAGNOSIS — D509 Iron deficiency anemia, unspecified: Secondary | ICD-10-CM | POA: Diagnosis not present

## 2014-08-17 DIAGNOSIS — N2581 Secondary hyperparathyroidism of renal origin: Secondary | ICD-10-CM | POA: Diagnosis not present

## 2014-08-17 DIAGNOSIS — N186 End stage renal disease: Secondary | ICD-10-CM | POA: Diagnosis not present

## 2014-08-17 DIAGNOSIS — E1129 Type 2 diabetes mellitus with other diabetic kidney complication: Secondary | ICD-10-CM | POA: Diagnosis not present

## 2014-08-17 DIAGNOSIS — D631 Anemia in chronic kidney disease: Secondary | ICD-10-CM | POA: Diagnosis not present

## 2014-08-19 DIAGNOSIS — D509 Iron deficiency anemia, unspecified: Secondary | ICD-10-CM | POA: Diagnosis not present

## 2014-08-19 DIAGNOSIS — D631 Anemia in chronic kidney disease: Secondary | ICD-10-CM | POA: Diagnosis not present

## 2014-08-19 DIAGNOSIS — N2581 Secondary hyperparathyroidism of renal origin: Secondary | ICD-10-CM | POA: Diagnosis not present

## 2014-08-19 DIAGNOSIS — E1129 Type 2 diabetes mellitus with other diabetic kidney complication: Secondary | ICD-10-CM | POA: Diagnosis not present

## 2014-08-19 DIAGNOSIS — N186 End stage renal disease: Secondary | ICD-10-CM | POA: Diagnosis not present

## 2014-08-22 DIAGNOSIS — D509 Iron deficiency anemia, unspecified: Secondary | ICD-10-CM | POA: Diagnosis not present

## 2014-08-22 DIAGNOSIS — N2581 Secondary hyperparathyroidism of renal origin: Secondary | ICD-10-CM | POA: Diagnosis not present

## 2014-08-22 DIAGNOSIS — D631 Anemia in chronic kidney disease: Secondary | ICD-10-CM | POA: Diagnosis not present

## 2014-08-22 DIAGNOSIS — N186 End stage renal disease: Secondary | ICD-10-CM | POA: Diagnosis not present

## 2014-08-22 DIAGNOSIS — E1129 Type 2 diabetes mellitus with other diabetic kidney complication: Secondary | ICD-10-CM | POA: Diagnosis not present

## 2014-08-23 DIAGNOSIS — E11359 Type 2 diabetes mellitus with proliferative diabetic retinopathy without macular edema: Secondary | ICD-10-CM | POA: Diagnosis not present

## 2014-08-23 DIAGNOSIS — E1139 Type 2 diabetes mellitus with other diabetic ophthalmic complication: Secondary | ICD-10-CM | POA: Diagnosis not present

## 2014-08-24 DIAGNOSIS — N2581 Secondary hyperparathyroidism of renal origin: Secondary | ICD-10-CM | POA: Diagnosis not present

## 2014-08-24 DIAGNOSIS — N186 End stage renal disease: Secondary | ICD-10-CM | POA: Diagnosis not present

## 2014-08-24 DIAGNOSIS — D509 Iron deficiency anemia, unspecified: Secondary | ICD-10-CM | POA: Diagnosis not present

## 2014-08-24 DIAGNOSIS — E1129 Type 2 diabetes mellitus with other diabetic kidney complication: Secondary | ICD-10-CM | POA: Diagnosis not present

## 2014-08-24 DIAGNOSIS — D631 Anemia in chronic kidney disease: Secondary | ICD-10-CM | POA: Diagnosis not present

## 2014-08-25 DIAGNOSIS — N186 End stage renal disease: Secondary | ICD-10-CM | POA: Diagnosis not present

## 2014-08-25 DIAGNOSIS — Z992 Dependence on renal dialysis: Secondary | ICD-10-CM | POA: Diagnosis not present

## 2014-08-25 DIAGNOSIS — E1129 Type 2 diabetes mellitus with other diabetic kidney complication: Secondary | ICD-10-CM | POA: Diagnosis not present

## 2014-08-26 DIAGNOSIS — D631 Anemia in chronic kidney disease: Secondary | ICD-10-CM | POA: Diagnosis not present

## 2014-08-26 DIAGNOSIS — N2581 Secondary hyperparathyroidism of renal origin: Secondary | ICD-10-CM | POA: Diagnosis not present

## 2014-08-26 DIAGNOSIS — D509 Iron deficiency anemia, unspecified: Secondary | ICD-10-CM | POA: Diagnosis not present

## 2014-08-26 DIAGNOSIS — N186 End stage renal disease: Secondary | ICD-10-CM | POA: Diagnosis not present

## 2014-08-29 DIAGNOSIS — D631 Anemia in chronic kidney disease: Secondary | ICD-10-CM | POA: Diagnosis not present

## 2014-08-29 DIAGNOSIS — N186 End stage renal disease: Secondary | ICD-10-CM | POA: Diagnosis not present

## 2014-08-29 DIAGNOSIS — N2581 Secondary hyperparathyroidism of renal origin: Secondary | ICD-10-CM | POA: Diagnosis not present

## 2014-08-29 DIAGNOSIS — D509 Iron deficiency anemia, unspecified: Secondary | ICD-10-CM | POA: Diagnosis not present

## 2014-08-30 DIAGNOSIS — S92351G Displaced fracture of fifth metatarsal bone, right foot, subsequent encounter for fracture with delayed healing: Secondary | ICD-10-CM | POA: Diagnosis not present

## 2014-08-30 DIAGNOSIS — M65322 Trigger finger, left index finger: Secondary | ICD-10-CM | POA: Diagnosis not present

## 2014-08-31 DIAGNOSIS — N186 End stage renal disease: Secondary | ICD-10-CM | POA: Diagnosis not present

## 2014-08-31 DIAGNOSIS — D509 Iron deficiency anemia, unspecified: Secondary | ICD-10-CM | POA: Diagnosis not present

## 2014-08-31 DIAGNOSIS — D631 Anemia in chronic kidney disease: Secondary | ICD-10-CM | POA: Diagnosis not present

## 2014-08-31 DIAGNOSIS — N2581 Secondary hyperparathyroidism of renal origin: Secondary | ICD-10-CM | POA: Diagnosis not present

## 2014-09-01 ENCOUNTER — Other Ambulatory Visit: Payer: Self-pay | Admitting: Orthopedic Surgery

## 2014-09-01 DIAGNOSIS — M79671 Pain in right foot: Secondary | ICD-10-CM

## 2014-09-02 DIAGNOSIS — D509 Iron deficiency anemia, unspecified: Secondary | ICD-10-CM | POA: Diagnosis not present

## 2014-09-02 DIAGNOSIS — D631 Anemia in chronic kidney disease: Secondary | ICD-10-CM | POA: Diagnosis not present

## 2014-09-02 DIAGNOSIS — N2581 Secondary hyperparathyroidism of renal origin: Secondary | ICD-10-CM | POA: Diagnosis not present

## 2014-09-02 DIAGNOSIS — N186 End stage renal disease: Secondary | ICD-10-CM | POA: Diagnosis not present

## 2014-09-05 DIAGNOSIS — N2581 Secondary hyperparathyroidism of renal origin: Secondary | ICD-10-CM | POA: Diagnosis not present

## 2014-09-05 DIAGNOSIS — N186 End stage renal disease: Secondary | ICD-10-CM | POA: Diagnosis not present

## 2014-09-05 DIAGNOSIS — D509 Iron deficiency anemia, unspecified: Secondary | ICD-10-CM | POA: Diagnosis not present

## 2014-09-05 DIAGNOSIS — D631 Anemia in chronic kidney disease: Secondary | ICD-10-CM | POA: Diagnosis not present

## 2014-09-07 DIAGNOSIS — E1139 Type 2 diabetes mellitus with other diabetic ophthalmic complication: Secondary | ICD-10-CM | POA: Diagnosis not present

## 2014-09-07 DIAGNOSIS — D631 Anemia in chronic kidney disease: Secondary | ICD-10-CM | POA: Diagnosis not present

## 2014-09-07 DIAGNOSIS — N186 End stage renal disease: Secondary | ICD-10-CM | POA: Diagnosis not present

## 2014-09-07 DIAGNOSIS — D509 Iron deficiency anemia, unspecified: Secondary | ICD-10-CM | POA: Diagnosis not present

## 2014-09-07 DIAGNOSIS — N2581 Secondary hyperparathyroidism of renal origin: Secondary | ICD-10-CM | POA: Diagnosis not present

## 2014-09-08 ENCOUNTER — Ambulatory Visit
Admission: RE | Admit: 2014-09-08 | Discharge: 2014-09-08 | Disposition: A | Discharge status: Home / Self Care | Payer: Medicare Other | Source: Ambulatory Visit | Attending: Orthopedic Surgery | Admitting: Orthopedic Surgery

## 2014-09-08 DIAGNOSIS — S92351K Displaced fracture of fifth metatarsal bone, right foot, subsequent encounter for fracture with nonunion: Secondary | ICD-10-CM | POA: Diagnosis not present

## 2014-09-08 DIAGNOSIS — M79671 Pain in right foot: Secondary | ICD-10-CM

## 2014-09-09 DIAGNOSIS — N2581 Secondary hyperparathyroidism of renal origin: Secondary | ICD-10-CM | POA: Diagnosis not present

## 2014-09-09 DIAGNOSIS — N186 End stage renal disease: Secondary | ICD-10-CM | POA: Diagnosis not present

## 2014-09-09 DIAGNOSIS — D631 Anemia in chronic kidney disease: Secondary | ICD-10-CM | POA: Diagnosis not present

## 2014-09-09 DIAGNOSIS — D509 Iron deficiency anemia, unspecified: Secondary | ICD-10-CM | POA: Diagnosis not present

## 2014-09-11 DIAGNOSIS — M62830 Muscle spasm of back: Secondary | ICD-10-CM | POA: Diagnosis not present

## 2014-09-12 DIAGNOSIS — D631 Anemia in chronic kidney disease: Secondary | ICD-10-CM | POA: Diagnosis not present

## 2014-09-12 DIAGNOSIS — D509 Iron deficiency anemia, unspecified: Secondary | ICD-10-CM | POA: Diagnosis not present

## 2014-09-12 DIAGNOSIS — N186 End stage renal disease: Secondary | ICD-10-CM | POA: Diagnosis not present

## 2014-09-12 DIAGNOSIS — N2581 Secondary hyperparathyroidism of renal origin: Secondary | ICD-10-CM | POA: Diagnosis not present

## 2014-09-14 DIAGNOSIS — D631 Anemia in chronic kidney disease: Secondary | ICD-10-CM | POA: Diagnosis not present

## 2014-09-14 DIAGNOSIS — D509 Iron deficiency anemia, unspecified: Secondary | ICD-10-CM | POA: Diagnosis not present

## 2014-09-14 DIAGNOSIS — N186 End stage renal disease: Secondary | ICD-10-CM | POA: Diagnosis not present

## 2014-09-14 DIAGNOSIS — N2581 Secondary hyperparathyroidism of renal origin: Secondary | ICD-10-CM | POA: Diagnosis not present

## 2014-09-14 DIAGNOSIS — E1129 Type 2 diabetes mellitus with other diabetic kidney complication: Secondary | ICD-10-CM | POA: Diagnosis not present

## 2014-09-16 DIAGNOSIS — D509 Iron deficiency anemia, unspecified: Secondary | ICD-10-CM | POA: Diagnosis not present

## 2014-09-16 DIAGNOSIS — D631 Anemia in chronic kidney disease: Secondary | ICD-10-CM | POA: Diagnosis not present

## 2014-09-16 DIAGNOSIS — N2581 Secondary hyperparathyroidism of renal origin: Secondary | ICD-10-CM | POA: Diagnosis not present

## 2014-09-16 DIAGNOSIS — N186 End stage renal disease: Secondary | ICD-10-CM | POA: Diagnosis not present

## 2014-09-19 DIAGNOSIS — N2581 Secondary hyperparathyroidism of renal origin: Secondary | ICD-10-CM | POA: Diagnosis not present

## 2014-09-19 DIAGNOSIS — N186 End stage renal disease: Secondary | ICD-10-CM | POA: Diagnosis not present

## 2014-09-19 DIAGNOSIS — D509 Iron deficiency anemia, unspecified: Secondary | ICD-10-CM | POA: Diagnosis not present

## 2014-09-19 DIAGNOSIS — D631 Anemia in chronic kidney disease: Secondary | ICD-10-CM | POA: Diagnosis not present

## 2014-09-20 DIAGNOSIS — G4489 Other headache syndrome: Secondary | ICD-10-CM | POA: Diagnosis not present

## 2014-09-20 DIAGNOSIS — G43019 Migraine without aura, intractable, without status migrainosus: Secondary | ICD-10-CM | POA: Diagnosis not present

## 2014-09-21 DIAGNOSIS — D509 Iron deficiency anemia, unspecified: Secondary | ICD-10-CM | POA: Diagnosis not present

## 2014-09-21 DIAGNOSIS — N2581 Secondary hyperparathyroidism of renal origin: Secondary | ICD-10-CM | POA: Diagnosis not present

## 2014-09-21 DIAGNOSIS — N186 End stage renal disease: Secondary | ICD-10-CM | POA: Diagnosis not present

## 2014-09-21 DIAGNOSIS — D631 Anemia in chronic kidney disease: Secondary | ICD-10-CM | POA: Diagnosis not present

## 2014-09-22 DIAGNOSIS — M6701 Short Achilles tendon (acquired), right ankle: Secondary | ICD-10-CM | POA: Diagnosis not present

## 2014-09-22 DIAGNOSIS — S92351G Displaced fracture of fifth metatarsal bone, right foot, subsequent encounter for fracture with delayed healing: Secondary | ICD-10-CM | POA: Diagnosis not present

## 2014-09-23 DIAGNOSIS — N186 End stage renal disease: Secondary | ICD-10-CM | POA: Diagnosis not present

## 2014-09-23 DIAGNOSIS — D509 Iron deficiency anemia, unspecified: Secondary | ICD-10-CM | POA: Diagnosis not present

## 2014-09-23 DIAGNOSIS — Z7682 Awaiting organ transplant status: Secondary | ICD-10-CM | POA: Diagnosis not present

## 2014-09-23 DIAGNOSIS — D631 Anemia in chronic kidney disease: Secondary | ICD-10-CM | POA: Diagnosis not present

## 2014-09-23 DIAGNOSIS — N2581 Secondary hyperparathyroidism of renal origin: Secondary | ICD-10-CM | POA: Diagnosis not present

## 2014-09-24 DIAGNOSIS — N186 End stage renal disease: Secondary | ICD-10-CM | POA: Diagnosis not present

## 2014-09-24 DIAGNOSIS — Z992 Dependence on renal dialysis: Secondary | ICD-10-CM | POA: Diagnosis not present

## 2014-09-24 DIAGNOSIS — E1129 Type 2 diabetes mellitus with other diabetic kidney complication: Secondary | ICD-10-CM | POA: Diagnosis not present

## 2014-09-26 DIAGNOSIS — E1129 Type 2 diabetes mellitus with other diabetic kidney complication: Secondary | ICD-10-CM | POA: Diagnosis not present

## 2014-09-26 DIAGNOSIS — N2581 Secondary hyperparathyroidism of renal origin: Secondary | ICD-10-CM | POA: Diagnosis not present

## 2014-09-26 DIAGNOSIS — D631 Anemia in chronic kidney disease: Secondary | ICD-10-CM | POA: Diagnosis not present

## 2014-09-26 DIAGNOSIS — N186 End stage renal disease: Secondary | ICD-10-CM | POA: Diagnosis not present

## 2014-09-28 DIAGNOSIS — E1129 Type 2 diabetes mellitus with other diabetic kidney complication: Secondary | ICD-10-CM | POA: Diagnosis not present

## 2014-09-28 DIAGNOSIS — D631 Anemia in chronic kidney disease: Secondary | ICD-10-CM | POA: Diagnosis not present

## 2014-09-28 DIAGNOSIS — N2581 Secondary hyperparathyroidism of renal origin: Secondary | ICD-10-CM | POA: Diagnosis not present

## 2014-09-28 DIAGNOSIS — N186 End stage renal disease: Secondary | ICD-10-CM | POA: Diagnosis not present

## 2014-09-30 DIAGNOSIS — N2581 Secondary hyperparathyroidism of renal origin: Secondary | ICD-10-CM | POA: Diagnosis not present

## 2014-09-30 DIAGNOSIS — N186 End stage renal disease: Secondary | ICD-10-CM | POA: Diagnosis not present

## 2014-09-30 DIAGNOSIS — E1129 Type 2 diabetes mellitus with other diabetic kidney complication: Secondary | ICD-10-CM | POA: Diagnosis not present

## 2014-09-30 DIAGNOSIS — D631 Anemia in chronic kidney disease: Secondary | ICD-10-CM | POA: Diagnosis not present

## 2014-10-03 DIAGNOSIS — E1129 Type 2 diabetes mellitus with other diabetic kidney complication: Secondary | ICD-10-CM | POA: Diagnosis not present

## 2014-10-03 DIAGNOSIS — N186 End stage renal disease: Secondary | ICD-10-CM | POA: Diagnosis not present

## 2014-10-03 DIAGNOSIS — D631 Anemia in chronic kidney disease: Secondary | ICD-10-CM | POA: Diagnosis not present

## 2014-10-03 DIAGNOSIS — N2581 Secondary hyperparathyroidism of renal origin: Secondary | ICD-10-CM | POA: Diagnosis not present

## 2014-10-05 DIAGNOSIS — N2581 Secondary hyperparathyroidism of renal origin: Secondary | ICD-10-CM | POA: Diagnosis not present

## 2014-10-05 DIAGNOSIS — N186 End stage renal disease: Secondary | ICD-10-CM | POA: Diagnosis not present

## 2014-10-05 DIAGNOSIS — E1129 Type 2 diabetes mellitus with other diabetic kidney complication: Secondary | ICD-10-CM | POA: Diagnosis not present

## 2014-10-05 DIAGNOSIS — D631 Anemia in chronic kidney disease: Secondary | ICD-10-CM | POA: Diagnosis not present

## 2014-10-07 DIAGNOSIS — N186 End stage renal disease: Secondary | ICD-10-CM | POA: Diagnosis not present

## 2014-10-07 DIAGNOSIS — E1129 Type 2 diabetes mellitus with other diabetic kidney complication: Secondary | ICD-10-CM | POA: Diagnosis not present

## 2014-10-07 DIAGNOSIS — N2581 Secondary hyperparathyroidism of renal origin: Secondary | ICD-10-CM | POA: Diagnosis not present

## 2014-10-07 DIAGNOSIS — D631 Anemia in chronic kidney disease: Secondary | ICD-10-CM | POA: Diagnosis not present

## 2014-10-10 DIAGNOSIS — N2581 Secondary hyperparathyroidism of renal origin: Secondary | ICD-10-CM | POA: Diagnosis not present

## 2014-10-10 DIAGNOSIS — N186 End stage renal disease: Secondary | ICD-10-CM | POA: Diagnosis not present

## 2014-10-10 DIAGNOSIS — E1129 Type 2 diabetes mellitus with other diabetic kidney complication: Secondary | ICD-10-CM | POA: Diagnosis not present

## 2014-10-10 DIAGNOSIS — D631 Anemia in chronic kidney disease: Secondary | ICD-10-CM | POA: Diagnosis not present

## 2014-10-12 DIAGNOSIS — E1129 Type 2 diabetes mellitus with other diabetic kidney complication: Secondary | ICD-10-CM | POA: Diagnosis not present

## 2014-10-12 DIAGNOSIS — D631 Anemia in chronic kidney disease: Secondary | ICD-10-CM | POA: Diagnosis not present

## 2014-10-12 DIAGNOSIS — N2581 Secondary hyperparathyroidism of renal origin: Secondary | ICD-10-CM | POA: Diagnosis not present

## 2014-10-12 DIAGNOSIS — N186 End stage renal disease: Secondary | ICD-10-CM | POA: Diagnosis not present

## 2014-10-14 DIAGNOSIS — N2581 Secondary hyperparathyroidism of renal origin: Secondary | ICD-10-CM | POA: Diagnosis not present

## 2014-10-14 DIAGNOSIS — E1129 Type 2 diabetes mellitus with other diabetic kidney complication: Secondary | ICD-10-CM | POA: Diagnosis not present

## 2014-10-14 DIAGNOSIS — N186 End stage renal disease: Secondary | ICD-10-CM | POA: Diagnosis not present

## 2014-10-14 DIAGNOSIS — D631 Anemia in chronic kidney disease: Secondary | ICD-10-CM | POA: Diagnosis not present

## 2014-10-17 DIAGNOSIS — N186 End stage renal disease: Secondary | ICD-10-CM | POA: Diagnosis not present

## 2014-10-17 DIAGNOSIS — D631 Anemia in chronic kidney disease: Secondary | ICD-10-CM | POA: Diagnosis not present

## 2014-10-17 DIAGNOSIS — E1129 Type 2 diabetes mellitus with other diabetic kidney complication: Secondary | ICD-10-CM | POA: Diagnosis not present

## 2014-10-17 DIAGNOSIS — N2581 Secondary hyperparathyroidism of renal origin: Secondary | ICD-10-CM | POA: Diagnosis not present

## 2014-10-19 DIAGNOSIS — D631 Anemia in chronic kidney disease: Secondary | ICD-10-CM | POA: Diagnosis not present

## 2014-10-19 DIAGNOSIS — N186 End stage renal disease: Secondary | ICD-10-CM | POA: Diagnosis not present

## 2014-10-19 DIAGNOSIS — E1129 Type 2 diabetes mellitus with other diabetic kidney complication: Secondary | ICD-10-CM | POA: Diagnosis not present

## 2014-10-19 DIAGNOSIS — N2581 Secondary hyperparathyroidism of renal origin: Secondary | ICD-10-CM | POA: Diagnosis not present

## 2014-10-20 DIAGNOSIS — M6701 Short Achilles tendon (acquired), right ankle: Secondary | ICD-10-CM | POA: Diagnosis not present

## 2014-10-20 DIAGNOSIS — I252 Old myocardial infarction: Secondary | ICD-10-CM | POA: Diagnosis not present

## 2014-10-20 DIAGNOSIS — S92351G Displaced fracture of fifth metatarsal bone, right foot, subsequent encounter for fracture with delayed healing: Secondary | ICD-10-CM | POA: Diagnosis not present

## 2014-10-20 DIAGNOSIS — I10 Essential (primary) hypertension: Secondary | ICD-10-CM | POA: Diagnosis not present

## 2014-10-20 DIAGNOSIS — I2510 Atherosclerotic heart disease of native coronary artery without angina pectoris: Secondary | ICD-10-CM | POA: Diagnosis not present

## 2014-10-20 DIAGNOSIS — E109 Type 1 diabetes mellitus without complications: Secondary | ICD-10-CM | POA: Diagnosis not present

## 2014-10-21 DIAGNOSIS — E1129 Type 2 diabetes mellitus with other diabetic kidney complication: Secondary | ICD-10-CM | POA: Diagnosis not present

## 2014-10-21 DIAGNOSIS — D631 Anemia in chronic kidney disease: Secondary | ICD-10-CM | POA: Diagnosis not present

## 2014-10-21 DIAGNOSIS — N186 End stage renal disease: Secondary | ICD-10-CM | POA: Diagnosis not present

## 2014-10-21 DIAGNOSIS — N2581 Secondary hyperparathyroidism of renal origin: Secondary | ICD-10-CM | POA: Diagnosis not present

## 2014-10-24 DIAGNOSIS — N186 End stage renal disease: Secondary | ICD-10-CM | POA: Diagnosis not present

## 2014-10-24 DIAGNOSIS — N2581 Secondary hyperparathyroidism of renal origin: Secondary | ICD-10-CM | POA: Diagnosis not present

## 2014-10-24 DIAGNOSIS — D631 Anemia in chronic kidney disease: Secondary | ICD-10-CM | POA: Diagnosis not present

## 2014-10-24 DIAGNOSIS — E1129 Type 2 diabetes mellitus with other diabetic kidney complication: Secondary | ICD-10-CM | POA: Diagnosis not present

## 2014-10-25 DIAGNOSIS — E11359 Type 2 diabetes mellitus with proliferative diabetic retinopathy without macular edema: Secondary | ICD-10-CM | POA: Diagnosis not present

## 2014-10-25 DIAGNOSIS — E1129 Type 2 diabetes mellitus with other diabetic kidney complication: Secondary | ICD-10-CM | POA: Diagnosis not present

## 2014-10-25 DIAGNOSIS — E1139 Type 2 diabetes mellitus with other diabetic ophthalmic complication: Secondary | ICD-10-CM | POA: Diagnosis not present

## 2014-10-25 DIAGNOSIS — Z992 Dependence on renal dialysis: Secondary | ICD-10-CM | POA: Diagnosis not present

## 2014-10-25 DIAGNOSIS — N186 End stage renal disease: Secondary | ICD-10-CM | POA: Diagnosis not present

## 2014-10-25 DIAGNOSIS — H35371 Puckering of macula, right eye: Secondary | ICD-10-CM | POA: Diagnosis not present

## 2014-10-26 ENCOUNTER — Ambulatory Visit: Payer: Medicare Other | Admitting: Internal Medicine

## 2014-10-26 DIAGNOSIS — D631 Anemia in chronic kidney disease: Secondary | ICD-10-CM | POA: Diagnosis not present

## 2014-10-26 DIAGNOSIS — N186 End stage renal disease: Secondary | ICD-10-CM | POA: Diagnosis not present

## 2014-10-26 DIAGNOSIS — D509 Iron deficiency anemia, unspecified: Secondary | ICD-10-CM | POA: Diagnosis not present

## 2014-10-26 DIAGNOSIS — N2581 Secondary hyperparathyroidism of renal origin: Secondary | ICD-10-CM | POA: Diagnosis not present

## 2014-10-26 DIAGNOSIS — E1129 Type 2 diabetes mellitus with other diabetic kidney complication: Secondary | ICD-10-CM | POA: Diagnosis not present

## 2014-10-26 DIAGNOSIS — D508 Other iron deficiency anemias: Secondary | ICD-10-CM | POA: Diagnosis not present

## 2014-10-28 DIAGNOSIS — D508 Other iron deficiency anemias: Secondary | ICD-10-CM | POA: Diagnosis not present

## 2014-10-28 DIAGNOSIS — N2581 Secondary hyperparathyroidism of renal origin: Secondary | ICD-10-CM | POA: Diagnosis not present

## 2014-10-28 DIAGNOSIS — D509 Iron deficiency anemia, unspecified: Secondary | ICD-10-CM | POA: Diagnosis not present

## 2014-10-28 DIAGNOSIS — Z01810 Encounter for preprocedural cardiovascular examination: Secondary | ICD-10-CM | POA: Diagnosis not present

## 2014-10-28 DIAGNOSIS — E1129 Type 2 diabetes mellitus with other diabetic kidney complication: Secondary | ICD-10-CM | POA: Diagnosis not present

## 2014-10-28 DIAGNOSIS — N186 End stage renal disease: Secondary | ICD-10-CM | POA: Diagnosis not present

## 2014-10-29 ENCOUNTER — Ambulatory Visit (INDEPENDENT_AMBULATORY_CARE_PROVIDER_SITE_OTHER): Payer: Medicare Other

## 2014-10-29 ENCOUNTER — Ambulatory Visit (INDEPENDENT_AMBULATORY_CARE_PROVIDER_SITE_OTHER): Payer: Medicare Other | Admitting: Emergency Medicine

## 2014-10-29 VITALS — BP 160/80 | HR 76 | Temp 97.9°F | Resp 16 | Ht 76.0 in | Wt 236.4 lb

## 2014-10-29 DIAGNOSIS — E1122 Type 2 diabetes mellitus with diabetic chronic kidney disease: Secondary | ICD-10-CM

## 2014-10-29 DIAGNOSIS — R059 Cough, unspecified: Secondary | ICD-10-CM

## 2014-10-29 DIAGNOSIS — R0602 Shortness of breath: Secondary | ICD-10-CM

## 2014-10-29 DIAGNOSIS — Z992 Dependence on renal dialysis: Secondary | ICD-10-CM | POA: Diagnosis not present

## 2014-10-29 DIAGNOSIS — R05 Cough: Secondary | ICD-10-CM

## 2014-10-29 DIAGNOSIS — N189 Chronic kidney disease, unspecified: Secondary | ICD-10-CM | POA: Diagnosis not present

## 2014-10-29 DIAGNOSIS — N186 End stage renal disease: Secondary | ICD-10-CM

## 2014-10-29 MED ORDER — HYDROCOD POLST-CPM POLST ER 10-8 MG/5ML PO SUER: 5.0000 mL | Freq: Two times a day (BID) | ORAL | Status: DC

## 2014-10-29 MED ORDER — LEVOFLOXACIN 500 MG PO TABS: 500.0000 mg | ORAL_TABLET | Freq: Every day | ORAL | Status: DC

## 2014-10-29 NOTE — Progress Notes (Signed)
Subjective:  Patient ID: [name], female    DOB: [birth year]  Age: 39 y.o. MRN: [id number]  CC: Shortness of Breath; Chills; and Cough   HPI [name] presents  With cough and exertional shortness of breath. She has cough productive purulent sputum.no fever or chills. Has no nasal congestion postnasal drainage. Nausea vomiting. No stool change. She has no rash. No diarrhea she has no fever but feels chilled. She has generalized fatigue and malaise. No chest pain.  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   She has past surgical history that includes AV fistula placement (04/2010); Dilation and curettage of uterus (2009); Cervical biopsy w/ loop electrode excision; Cervical cone biopsy; and Colposcopy.   Her  family history includes Breast cancer in her maternal aunt; Diabetes in her father, mother, sister, and sister; Hyperlipidemia in her father; Hypertension in her father and mother; Stroke in her maternal grandmother.  She   reports that she quit smoking about 3 years ago. Her smoking use included Cigarettes. She has a 3 pack-year smoking history. She has never used smokeless tobacco. She reports that she does not drink alcohol or use illicit drugs.  Outpatient Prescriptions Prior to Visit  Medication Sig Dispense Refill  . amLODipine (NORVASC) 10 MG tablet Take 1 tablet (10 mg total) by mouth daily. 30 tablet 0  . atorvastatin (LIPITOR) 80 MG tablet Take 1 tablet (80 mg total) by mouth every  evening. 30 tablet 3  . cinacalcet (SENSIPAR) 30 MG tablet Take 30 mg by mouth daily.    . cloNIDine (CATAPRES) 0.1 MG tablet Take 0.1 mg by mouth 2 (two) times daily.    . clopidogrel (PLAVIX) 75 MG tablet Take 75 mg by mouth daily with breakfast.    . gabapentin (NEURONTIN) 300 MG capsule Take 300 mg by mouth every evening.     Marland Kitchen HUMALOG MIX 50/50 KWIKPEN (50-50) 100 UNIT/ML Kwikpen Inject 26-28 Units into the skin 2 (two) times daily. 28 in the morning and 26 in the evening.    Marland Kitchen HYDROcodone-acetaminophen (NORCO) 5-325 MG per tablet Take 1 tablet by mouth every 6 (six) hours as needed for moderate pain or severe pain. 15 tablet 0  . isosorbide mononitrate (IMDUR) 120 MG 24 hr tablet Take 120 mg by mouth daily.    Marland Kitchen labetalol (NORMODYNE) 300 MG tablet Take 1 tablet (300 mg total) by mouth 3 (three) times daily. (Patient taking differently: Take 300 mg by mouth 2 (two) times daily. ) 270 tablet 3  . nitroGLYCERIN (NITROSTAT) 0.4 MG SL tablet Place 0.4 mg under the tongue every 5 (five) minutes as needed. For chest pain    . ondansetron (ZOFRAN) 4 MG tablet Take 1 tablet (4 mg total) by mouth every 8 (eight) hours as needed for nausea or vomiting. 15 tablet 0  . RENVELA 800 MG tablet Take 1 tablet by mouth 4 (four) times daily - after meals and at bedtime.    Marland Kitchen imipramine (TOFRANIL) 10 MG tablet Take 40 mg by mouth at bedtime.   1  . insulin lispro (HUMALOG) 100 UNIT/ML injection Inject 6-8 Units into the skin 3 (three)  times daily before meals. Per sliding scale     No facility-administered medications prior to visit.    History   Social History  . Marital Status: Married    Spouse Name: [name]  . Number of Children: [name]  . Years of Education: [name]   Occupational History  .  [employer]   Social History Main Topics  . Smoking status: Former Smoker -- 0.30 packs/day for 10 years    Types: Cigarettes    Quit date: 08/05/2011  . Smokeless tobacco: Never Used     Comment: "quit smoking  cigarettes 07/2010"  . Alcohol Use: No  . Drug Use: No  . Sexual Activity: Yes    Birth Control/ Protection: Condom   Other Topics Concern  . None   Social History Narrative     Review of Systems  Constitutional: Negative for fever, chills and appetite change.  HENT: Negative for congestion, ear pain, postnasal drip, sinus pressure and sore throat.   Eyes: Negative for pain and redness.  Respiratory: Positive for cough and shortness of breath. Negative for wheezing.   Cardiovascular: Negative for leg swelling.  Gastrointestinal: Negative for nausea, vomiting, abdominal pain, diarrhea, constipation and blood in stool.  Endocrine: Negative for polyuria.  Genitourinary: Negative for dysuria, urgency, frequency and flank pain.  Musculoskeletal: Negative for gait problem.  Skin: Negative for rash.  Neurological: Negative for weakness and headaches.  Psychiatric/Behavioral: Negative for confusion and decreased concentration. The patient is not nervous/anxious.     Objective:  BP 160/80 mmHg  Pulse 76  Temp(Src) 97.9 F (36.6 C) (Oral)  Resp 16  Ht 6\' 4"  (1.93 m)  Wt 236 lb 6 oz (107.219 kg)  BMI 28.78 kg/m2  LMP 10/09/2014  Physical Exam  Constitutional: She is oriented to person, place, and time. She appears well-developed and well-nourished. No distress.  HENT:  Head: Normocephalic and atraumatic.  Right Ear: External ear normal.  Left Ear: External ear normal.  Nose: Nose normal.  Eyes: Conjunctivae and EOM are normal. Pupils are equal, round, and reactive to light. No scleral icterus.  Neck: Normal range of motion. Neck supple. No tracheal deviation present.  Cardiovascular: Normal rate, regular rhythm and normal heart sounds.   Pulmonary/Chest: Effort normal. No respiratory distress. She has no wheezes. She has no rales.  Abdominal: She exhibits no mass. There is no tenderness. There is no rebound and no guarding.  Musculoskeletal: She exhibits no edema.    Lymphadenopathy:    She has no cervical adenopathy.  Neurological: She is alert and oriented to person, place, and time. Coordination normal.  Skin: Skin is warm and dry. No rash noted.  Psychiatric: She has a normal mood and affect. Her behavior is normal.      Assessment & Plan:   Delle was seen today for shortness of breath, chills and cough.  Diagnoses and all orders for this visit:  Cough Orders: -     DG Chest 2 View; Future  Shortness of breath Orders: -     DG Chest 2 View; Future  ESRD on dialysis  Type 2 diabetes mellitus with diabetic chronic kidney disease   I have discontinued Ms. Kontz's insulin lispro and imipramine. I am also having her maintain her nitroGLYCERIN, cloNIDine, atorvastatin, labetalol, amLODipine, gabapentin, RENVELA, clopidogrel, isosorbide mononitrate, cinacalcet, HUMALOG MIX 50/50 KWIKPEN, HYDROcodone-acetaminophen, and ondansetron.  No orders of the defined types were placed in this encounter.    Appropriate red flag conditions were discussed with the patient as well  as actions that should be taken.  Patient expressed his understanding.  Follow-up: No Follow-up on file.  Roselee Culver, MD \\\   [facility] reading (PRIMARY) by  Dr. Ouida Sills kyphoscoliosis other than that no acute pulmonary disease.

## 2014-10-30 ENCOUNTER — Inpatient Hospital Stay (HOSPITAL_COMMUNITY)
Admission: EM | Admit: 2014-10-30 | Discharge: 2014-11-01 | DRG: 291 | Disposition: A | Discharge status: Home / Self Care | Payer: Medicare Other | Attending: Internal Medicine | Admitting: Internal Medicine

## 2014-10-30 ENCOUNTER — Other Ambulatory Visit (HOSPITAL_COMMUNITY): Payer: Self-pay

## 2014-10-30 ENCOUNTER — Telehealth: Payer: Self-pay | Admitting: Emergency Medicine

## 2014-10-30 ENCOUNTER — Encounter (HOSPITAL_COMMUNITY): Payer: Self-pay | Admitting: *Deleted

## 2014-10-30 ENCOUNTER — Emergency Department (HOSPITAL_COMMUNITY): Payer: Medicare Other

## 2014-10-30 DIAGNOSIS — I252 Old myocardial infarction: Secondary | ICD-10-CM

## 2014-10-30 DIAGNOSIS — Z86001 Personal history of in-situ neoplasm of cervix uteri: Secondary | ICD-10-CM | POA: Diagnosis not present

## 2014-10-30 DIAGNOSIS — I2510 Atherosclerotic heart disease of native coronary artery without angina pectoris: Secondary | ICD-10-CM | POA: Diagnosis present

## 2014-10-30 DIAGNOSIS — E785 Hyperlipidemia, unspecified: Secondary | ICD-10-CM | POA: Diagnosis present

## 2014-10-30 DIAGNOSIS — Z1401 Asymptomatic hemophilia A carrier: Secondary | ICD-10-CM | POA: Diagnosis not present

## 2014-10-30 DIAGNOSIS — E1051 Type 1 diabetes mellitus with diabetic peripheral angiopathy without gangrene: Secondary | ICD-10-CM | POA: Diagnosis present

## 2014-10-30 DIAGNOSIS — D649 Anemia, unspecified: Secondary | ICD-10-CM | POA: Diagnosis present

## 2014-10-30 DIAGNOSIS — R5383 Other fatigue: Secondary | ICD-10-CM

## 2014-10-30 DIAGNOSIS — Z6828 Body mass index (BMI) 28.0-28.9, adult: Secondary | ICD-10-CM | POA: Diagnosis not present

## 2014-10-30 DIAGNOSIS — Z87891 Personal history of nicotine dependence: Secondary | ICD-10-CM | POA: Diagnosis not present

## 2014-10-30 DIAGNOSIS — I120 Hypertensive chronic kidney disease with stage 5 chronic kidney disease or end stage renal disease: Secondary | ICD-10-CM | POA: Diagnosis present

## 2014-10-30 DIAGNOSIS — D631 Anemia in chronic kidney disease: Secondary | ICD-10-CM | POA: Diagnosis present

## 2014-10-30 DIAGNOSIS — Z7682 Awaiting organ transplant status: Secondary | ICD-10-CM

## 2014-10-30 DIAGNOSIS — I5033 Acute on chronic diastolic (congestive) heart failure: Principal | ICD-10-CM | POA: Diagnosis present

## 2014-10-30 DIAGNOSIS — Z79899 Other long term (current) drug therapy: Secondary | ICD-10-CM

## 2014-10-30 DIAGNOSIS — R0789 Other chest pain: Secondary | ICD-10-CM | POA: Diagnosis present

## 2014-10-30 DIAGNOSIS — K3184 Gastroparesis: Secondary | ICD-10-CM | POA: Diagnosis present

## 2014-10-30 DIAGNOSIS — E10610 Type 1 diabetes mellitus with diabetic neuropathic arthropathy: Secondary | ICD-10-CM | POA: Diagnosis present

## 2014-10-30 DIAGNOSIS — E1022 Type 1 diabetes mellitus with diabetic chronic kidney disease: Secondary | ICD-10-CM | POA: Diagnosis present

## 2014-10-30 DIAGNOSIS — R0600 Dyspnea, unspecified: Secondary | ICD-10-CM

## 2014-10-30 DIAGNOSIS — R0602 Shortness of breath: Secondary | ICD-10-CM | POA: Diagnosis not present

## 2014-10-30 DIAGNOSIS — E669 Obesity, unspecified: Secondary | ICD-10-CM | POA: Diagnosis present

## 2014-10-30 DIAGNOSIS — I739 Peripheral vascular disease, unspecified: Secondary | ICD-10-CM

## 2014-10-30 DIAGNOSIS — Z992 Dependence on renal dialysis: Secondary | ICD-10-CM | POA: Diagnosis not present

## 2014-10-30 DIAGNOSIS — I10 Essential (primary) hypertension: Secondary | ICD-10-CM

## 2014-10-30 DIAGNOSIS — R079 Chest pain, unspecified: Secondary | ICD-10-CM | POA: Diagnosis not present

## 2014-10-30 DIAGNOSIS — N186 End stage renal disease: Secondary | ICD-10-CM

## 2014-10-30 DIAGNOSIS — Z7902 Long term (current) use of antithrombotics/antiplatelets: Secondary | ICD-10-CM

## 2014-10-30 DIAGNOSIS — J90 Pleural effusion, not elsewhere classified: Secondary | ICD-10-CM | POA: Diagnosis not present

## 2014-10-30 DIAGNOSIS — N2581 Secondary hyperparathyroidism of renal origin: Secondary | ICD-10-CM | POA: Diagnosis present

## 2014-10-30 DIAGNOSIS — Z794 Long term (current) use of insulin: Secondary | ICD-10-CM

## 2014-10-30 DIAGNOSIS — D069 Carcinoma in situ of cervix, unspecified: Secondary | ICD-10-CM | POA: Diagnosis present

## 2014-10-30 DIAGNOSIS — I509 Heart failure, unspecified: Secondary | ICD-10-CM | POA: Diagnosis not present

## 2014-10-30 DIAGNOSIS — R599 Enlarged lymph nodes, unspecified: Secondary | ICD-10-CM | POA: Diagnosis not present

## 2014-10-30 DIAGNOSIS — E8770 Fluid overload, unspecified: Secondary | ICD-10-CM | POA: Diagnosis not present

## 2014-10-30 DIAGNOSIS — D591 Other autoimmune hemolytic anemias: Secondary | ICD-10-CM | POA: Diagnosis not present

## 2014-10-30 DIAGNOSIS — E1043 Type 1 diabetes mellitus with diabetic autonomic (poly)neuropathy: Secondary | ICD-10-CM | POA: Diagnosis present

## 2014-10-30 DIAGNOSIS — D638 Anemia in other chronic diseases classified elsewhere: Secondary | ICD-10-CM | POA: Diagnosis not present

## 2014-10-30 DIAGNOSIS — R7989 Other specified abnormal findings of blood chemistry: Secondary | ICD-10-CM | POA: Diagnosis not present

## 2014-10-30 HISTORY — DX: Disorder of kidney and ureter, unspecified: N28.9

## 2014-10-30 LAB — COMPREHENSIVE METABOLIC PANEL
ALBUMIN: 3.1 g/dL — AB (ref 3.5–5.0)
ALT: 17 U/L (ref 14–54)
ANION GAP: 13 (ref 5–15)
AST: 13 U/L — ABNORMAL LOW (ref 15–41)
Alkaline Phosphatase: 56 U/L (ref 38–126)
BUN: 54 mg/dL — ABNORMAL HIGH (ref 6–20)
CALCIUM: 8.5 mg/dL — AB (ref 8.9–10.3)
CO2: 25 mmol/L (ref 22–32)
CREATININE: 9.51 mg/dL — AB (ref 0.44–1.00)
Chloride: 98 mmol/L — ABNORMAL LOW (ref 101–111)
GFR calc Af Amer: 5 mL/min — ABNORMAL LOW (ref >60)
GFR calc non Af Amer: 5 mL/min — ABNORMAL LOW (ref >60)
Glucose, Bld: 505 mg/dL — ABNORMAL HIGH (ref 65–99)
POTASSIUM: 4.5 mmol/L (ref 3.5–5.1)
Sodium: 136 mmol/L (ref 135–145)
Total Bilirubin: 0.5 mg/dL (ref 0.3–1.2)
Total Protein: 6.5 g/dL (ref 6.5–8.1)

## 2014-10-30 LAB — CBC
HCT: 23.6 % — ABNORMAL LOW (ref 36.0–46.0)
HEMOGLOBIN: 7.6 g/dL — AB (ref 12.0–15.0)
MCH: 31.3 pg (ref 26.0–34.0)
MCHC: 32.2 g/dL (ref 30.0–36.0)
MCV: 97.1 fL (ref 78.0–100.0)
PLATELETS: 159 10*3/uL (ref 150–400)
RBC: 2.43 MIL/uL — ABNORMAL LOW (ref 3.87–5.11)
RDW: 12.8 % (ref 11.5–15.5)
WBC: 6.1 10*3/uL (ref 4.0–10.5)

## 2014-10-30 LAB — I-STAT BETA HCG BLOOD, ED (MC, WL, AP ONLY): I-stat hCG, quantitative: <5 m[IU]/mL (ref <5)

## 2014-10-30 LAB — I-STAT TROPONIN, ED: TROPONIN I, POC: 0.07 ng/mL (ref 0.00–0.08)

## 2014-10-30 LAB — POC OCCULT BLOOD, ED: Fecal Occult Bld: NEGATIVE

## 2014-10-30 LAB — TROPONIN I: TROPONIN I: 0.08 ng/mL — AB (ref <0.031)

## 2014-10-30 LAB — BRAIN NATRIURETIC PEPTIDE: B NATRIURETIC PEPTIDE 5: 2024 pg/mL — AB (ref 0.0–100.0)

## 2014-10-30 LAB — ABO/RH: ABO/RH(D): A POS

## 2014-10-30 LAB — PREPARE RBC (CROSSMATCH)

## 2014-10-30 MED ORDER — ASPIRIN 81 MG PO CHEW
324.0000 mg | CHEWABLE_TABLET | Freq: Once | ORAL | Status: AC
  Administered 2014-10-30: 324 mg via ORAL
  Filled 2014-10-30: qty 4

## 2014-10-30 MED ORDER — FUROSEMIDE 10 MG/ML IJ SOLN
40.0000 mg | Freq: Once | INTRAMUSCULAR | Status: AC
  Administered 2014-10-30: 40 mg via INTRAVENOUS
  Filled 2014-10-30: qty 4

## 2014-10-30 MED ORDER — INSULIN ASPART 100 UNIT/ML ~~LOC~~ SOLN
10.0000 [IU] | Freq: Once | SUBCUTANEOUS | Status: AC
  Administered 2014-10-30: 10 [IU] via SUBCUTANEOUS
  Filled 2014-10-30: qty 1

## 2014-10-30 MED ORDER — NITROGLYCERIN 0.2 MG/HR TD PT24
0.2000 mg | MEDICATED_PATCH | Freq: Every day | TRANSDERMAL | Status: DC
  Filled 2014-10-30: qty 1

## 2014-10-30 MED ORDER — SODIUM CHLORIDE 0.9 % IV SOLN
10.0000 mL/h | Freq: Once | INTRAVENOUS | Status: AC
  Administered 2014-10-30: 10 mL/h via INTRAVENOUS

## 2014-10-30 NOTE — ED Provider Notes (Signed)
CSN: [id number]     Arrival date & time 10/30/14  1819 History   First MD Initiated Contact with Patient 10/30/14 1907     Chief Complaint  Patient presents with  . Shortness of Breath  . Chest Pain     (Consider location/radiation/quality/duration/timing/severity/associated sxs/prior Treatment) The history is provided by the patient and medical records. No language interpreter was used.     [name] is a 39 y.o. female  with a hx of ESRD on dialysis MWF, insulin dependent diabetes, hypertension, hyperlipidemia, peripheral neuropathy, CAD presents to the Emergency Department complaining of gradual, persistent, progressively worsening SOB and chest tightness onset 1 week ago.  Pt reports no pain in her chest.  Pt denies increased swelling of her hands or feet.  Pt reports chills 1 week ago, but no measured fever, headache, neck pain, abd pain, N/V/D, weakness, dizziness, syncope, dysuria.  Pt reports she only makes a small amount of urine and this has not been dark or malodorous.  Pt reports she has taken all her BP meds today and her normal SBP is in the 140s.  Pt reports her CBGs have been normal.  Associated symptoms include severe fatigue.  Nothing makes it better and moving and walking makes it worse.  Pt reports hcb is usually around 12.  Pt reports her last blood transfusion was approx 6 years ago.    Pt was seen at urgent care yesterday with "normal" x-ray and given an x-ray.     Past Medical History  Diagnosis Date  . DM (diabetes mellitus)     Long-term insulin  . Hypertension   . Tobacco use disorder     Discontinued March 2012  . Hyperlipidemia     Hypertriglyceridemia 449 HDL 25  . Diabetic Charcot's joint disease   . Coronary artery disease     Status post cardiac catheterization June 2012 scattered coronary artery disease/atherosclerosis with 70-80% stenosis in a small right PDA.  . Gastroparesis   . Peripheral vascular disease     Tibial occlusive disease  evaluated by Dr. Kellie Simmering in August 2011. Medical therapy  . 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 CIN III (cervical intraepithelial neoplasia grade III) with severe dysplasia     S/P LEEP AND CONE  . Chronic anemia     2nd to renal disease  . End stage renal disease on dialysis 05/02/11    [facility]; M; W, F; last time 05/01/11  . Migraines     "just on dialysis days"  . Peripheral neuropathy     related to DM  . History of abscesses in groins 12/06/2010   Past Surgical History  Procedure Laterality Date  . Av fistula placement  04/2010  . Dilation and curettage of uterus  2009  . Cervical biopsy  w/ loop electrode excision      h/o  . Cervical cone biopsy      h/o  . Colposcopy     Family History  Problem Relation Age of Onset  . Diabetes Mother   . Hypertension Mother   . Diabetes Father   . Hyperlipidemia Father   . Hypertension Father   . Diabetes Sister   . Breast cancer Maternal Aunt     Age 49's  . Stroke Maternal Grandmother   . Diabetes Sister    History  Substance Use Topics  . Smoking status: Former Smoker -- 0.30 packs/day for 10 years    Types: Cigarettes    Quit date: 08/05/2011  .  Smokeless tobacco: Never Used     Comment: "quit smoking cigarettes 07/2010"  . Alcohol Use: No   OB History    Gravida Para Term Preterm AB TAB SAB Ectopic Multiple Living   2 1 1  1     1      Review of Systems  Constitutional: Positive for fatigue. Negative for fever, diaphoresis, appetite change and unexpected weight change.  HENT: Negative for mouth sores.   Eyes: Negative for visual disturbance.  Respiratory: Positive for shortness of breath. Negative for cough, chest tightness and wheezing.   Cardiovascular: Negative for chest pain.  Gastrointestinal: Negative for nausea, vomiting, abdominal pain, diarrhea and constipation.  Endocrine: Negative for polydipsia, polyphagia and polyuria.  Genitourinary: Negative for dysuria, urgency, frequency and hematuria.  Musculoskeletal:  Negative for back pain and neck stiffness.  Skin: Negative for rash.  Allergic/Immunologic: Negative for immunocompromised state.  Neurological: Negative for syncope, light-headedness and headaches.  Hematological: Does not bruise/bleed easily.  Psychiatric/Behavioral: Negative for sleep disturbance. The patient is not nervous/anxious.       Allergies  Cephalexin and Sulfamethoxazole-trimethoprim  Home Medications   Prior to Admission medications   Medication Sig Start Date End Date Taking? Authorizing Provider  amLODipine (NORVASC) 10 MG tablet Take 1 tablet (10 mg total) by mouth daily. 07/05/13  Yes Minus Breeding, MD  atorvastatin (LIPITOR) 80 MG tablet Take 1 tablet (80 mg total) by mouth every evening. 02/06/12  Yes Donney Dice, PA-C  chlorpheniramine-HYDROcodone (TUSSIONEX PENNKINETIC ER) 10-8 MG/5ML SUER Take 5 mLs by mouth 2 (two) times daily. 10/29/14  Yes Roselee Culver, MD  cinacalcet (SENSIPAR) 30 MG tablet Take 30 mg by mouth daily.   Yes Historical Provider, MD  cloNIDine (CATAPRES) 0.1 MG tablet Take 0.1 mg by mouth 2 (two) times daily.   Yes Historical Provider, MD  clopidogrel (PLAVIX) 75 MG tablet Take 75 mg by mouth daily with breakfast.   Yes Historical Provider, MD  gabapentin (NEURONTIN) 300 MG capsule Take 300 mg by mouth every evening.  06/19/13  Yes Historical Provider, MD  HUMALOG MIX 50/50 KWIKPEN (50-50) 100 UNIT/ML Kwikpen Inject 26-28 Units into the skin 2 (two) times daily. 28 in the morning and 26 in the evening. 04/29/14  Yes Historical Provider, MD  HYDROcodone-acetaminophen (NORCO) 5-325 MG per tablet Take 1 tablet by mouth every 6 (six) hours as needed for moderate pain or severe pain. 05/24/14  Yes Clayton Bibles, PA-C  isosorbide mononitrate (IMDUR) 120 MG 24 hr tablet Take 120 mg by mouth daily.   Yes Historical Provider, MD  labetalol (NORMODYNE) 300 MG tablet Take 1 tablet (300 mg total) by mouth 3 (three) times daily. Patient taking differently: Take  300 mg by mouth 2 (two) times daily.  05/15/12  Yes Minus Breeding, MD  levofloxacin (LEVAQUIN) 500 MG tablet Take 1 tablet (500 mg total) by mouth daily. Patient taking differently: Take 500 mg by mouth daily. Started medication on 10-29-14 10/29/14 11/08/14 Yes Roselee Culver, MD  nitroGLYCERIN (NITROSTAT) 0.4 MG SL tablet Place 0.4 mg under the tongue every 5 (five) minutes as needed. For chest pain   Yes Historical Provider, MD  ondansetron (ZOFRAN) 4 MG tablet Take 1 tablet (4 mg total) by mouth every 8 (eight) hours as needed for nausea or vomiting. 05/24/14  Yes Clayton Bibles, PA-C  RENVELA 800 MG tablet Take 1 tablet by mouth 4 (four) times daily - after meals and at bedtime. 04/17/13  Yes Historical Provider, MD   BP  161/74 mmHg  Pulse 78  Temp(Src) 97.7 F (36.5 C) (Oral)  Resp 21  Ht 6\' 4"  (1.93 m)  Wt 240 lb (108.863 kg)  BMI 29.23 kg/m2  SpO2 100%  LMP 10/09/2014 Physical Exam  Constitutional: She appears well-developed and well-nourished. No distress.  Awake, alert, nontoxic appearance  HENT:  Head: Normocephalic and atraumatic.  Mouth/Throat: Oropharynx is clear and moist. No oropharyngeal exudate.  Eyes: Conjunctivae are normal. No scleral icterus.  Neck: Normal range of motion. Neck supple.  Cardiovascular: Normal rate, regular rhythm, normal heart sounds and intact distal pulses.   No murmur heard. Pulmonary/Chest: Effort normal. No respiratory distress. She has decreased breath sounds. She has no wheezes.  Equal chest expansion Diminished breath sounds in the bases  Abdominal: Soft. Bowel sounds are normal. She exhibits no mass. There is no tenderness. There is no rebound and no guarding.  Musculoskeletal: Normal range of motion. She exhibits no edema.  No peripheral edema Fistula with palpable thrill in the LUE  Neurological: She is alert.  Speech is clear and goal oriented Moves extremities without ataxia  Skin: Skin is warm and dry. She is not diaphoretic.   Psychiatric: She has a normal mood and affect.  Nursing note and vitals reviewed.   ED Course  Procedures (including critical care time) Labs Review Labs Reviewed  CBC - Abnormal; Notable for the following:    RBC 2.43 (*)    Hemoglobin 7.6 (*)    HCT 23.6 (*)    All other components within normal limits  COMPREHENSIVE METABOLIC PANEL - Abnormal; Notable for the following:    Chloride 98 (*)    Glucose, Bld 505 (*)    BUN 54 (*)    Creatinine, Ser 9.51 (*)    Calcium 8.5 (*)    Albumin 3.1 (*)    AST 13 (*)    GFR calc non Af Amer 5 (*)    GFR calc Af Amer 5 (*)    All other components within normal limits  BRAIN NATRIURETIC PEPTIDE - Abnormal; Notable for the following:    B Natriuretic Peptide 2024.0 (*)    All other components within normal limits  I-STAT TROPOININ, ED  POC OCCULT BLOOD, ED  I-STAT BETA HCG BLOOD, ED ([facility], [facility], [facility] ONLY)  POC URINE PREG, ED  PREPARE RBC (CROSSMATCH)  TYPE AND SCREEN  ABO/RH    Imaging Review Dg Chest 2 View  10/29/2014   CLINICAL DATA:  Cough and shortness of breath.  EXAM: CHEST  2 VIEW  COMPARISON:  None.  FINDINGS: Heart size and pulmonary vascularity are normal. Slight peribronchial thickening on the left. Lungs are otherwise clear. No effusions. No osseous abnormality.  IMPRESSION: Slight bronchitic changes.   Electronically Signed   By: Lorriane Shire M.D.   On: 10/29/2014 15:52   Dg Chest Port 1 View  10/30/2014   CLINICAL DATA:  Shortness of breath and chest pain for 1 week. Symptoms worsening today.  EXAM: PORTABLE CHEST - 1 VIEW  COMPARISON:  10/29/2014  FINDINGS: The heart is mildly enlarged. There is vascular congestion and interstitial pulmonary edema. No pleural effusions. The bony thorax is intact.  IMPRESSION: Mild CHF.   Electronically Signed   By: Marijo Sanes M.D.   On: 10/30/2014 19:25     EKG Interpretation   Date/Time:  Sunday October 30 2014 18:25:23 EDT Ventricular Rate:  84 PR Interval:  154 QRS Duration:  102 QT Interval:  442 QTC Calculation: 522 R Axis:   -  10 Text Interpretation:  Normal sinus rhythm ST \\T \ T wave abnormality,  consider lateral ischemia Prolonged QT Abnormal ECG No significant change  since last tracing Confirmed by YAO  MD, DAVID ([id number]) on 10/30/2014 9:16:57  PM     CRITICAL CARE Performed by: Abigail Butts Total critical care time: 48min Critical care time was exclusive of separately billable procedures and treating other patients. Critical care was necessary to treat or prevent imminent or life-threatening deterioration. Critical care was time spent personally by me on the following activities: development of treatment plan with patient and/or surrogate as well as nursing, discussions with consultants, evaluation of patient's response to treatment, examination of patient, obtaining history from patient or surrogate, ordering and performing treatments and interventions, ordering and review of laboratory studies, ordering and review of radiographic studies, pulse oximetry and re-evaluation of patient's condition.   MDM   Final diagnoses:  Symptomatic anemia  SOB (shortness of breath)  Other fatigue  ESRD (end stage renal disease) on dialysis   [name] presents  With persistent shortness of breath for one week unchanged before or after dialysis. Patient also with fatigue. Presents to the emergency department with hypertension and shortness of breath. No chest pain. Pt with hx of CAD.    No ischemic changes and patient's ECG. She is found to be anemic at 7.6. She denies known bleeding. She denies  Melanoma or hematochezia.   Fecal occult negative. Patient with mild CHF on her chest x-ray as compared to yesterday's which was taken at urgent care. Elevation in BNP to just over 2000. Negative troponin.    Patient will need blood transfusion and admission. She is due for dialysis in the morning. No hypoxia or accessory muscle usage here in the emergency  department. Patient given Lasix 40 mg IV prior to blood transfusion. Will proceed with admission.   Blood pressure has improved here in the emergency department without treatment.  BP 161/74 mmHg  Pulse 78  Temp(Src) 97.7 F (36.5 C) (Oral)  Resp 21  Ht 6\' 4"  (1.93 m)  Wt 240 lb (108.863 kg)  BMI 29.23 kg/m2  SpO2 100%  LMP 10/09/2014   9:51 PM Discussed with Dr. Blaine Hamper who will admit tele.     Jarrett Soho Akshitha Culmer, PA-C 10/30/14 [place], PA-C 10/31/14 [id number]  Wandra Arthurs, MD 10/31/14 [phone]

## 2014-10-30 NOTE — ED Notes (Signed)
Pt reports unable to provide urine sample at this time. Pt does not make lot of urine and she just urinated.

## 2014-10-30 NOTE — Progress Notes (Signed)
Called for report. Phone number busy at this time.

## 2014-10-30 NOTE — Telephone Encounter (Signed)
Patient's husband came to walk in center and stated that his wife was seen yesterday (10/30/2014) and she is still not feeling well. Trouble breathing, etc. He wants to know what else can be done. Any other medications that can be prescribed? Please help.   [phone]

## 2014-10-30 NOTE — ED Notes (Addendum)
Pt with sob x 1 week and chest pressure that increases with inspiration.  Dialysis MWF.  Hypertensive in triage.  Pt was seen at [facility] yesterday, was told she didn't have pnx, but was given antibiotics.

## 2014-10-30 NOTE — H&P (Signed)
[facility] History and Physical  [name] WVP:[id number] DOB: [birth year] DOA: 10/30/2014  Referring physician: ED physician PCP: Merrilee Seashore, MD  Specialists:   Chief Complaint: Shortness of breath and chest tightness.  HPI: [name] is a 39 y.o. female with PMH of hypertension, hyperlipidemia, diabetes mellitus, coronary artery disease, PVD, gastroparesis, migraine headache, peripheral neuropathy, ESRD-HD (MWF), who presents with SOB and chest tightness.  He reports that she has been having shortness of breath in the past week. It is aggravated by laying flat. She has mild dry cough and chest tightness. Her chest tightness has resolved. Currently no chest tightness or chest pain. She does not have fever or chills. No tenderness over her calf areas.  Currently patient denies fever, chills, running nose, ear pain, headaches, abdominal pain, diarrhea, constipation, dysuria, urgency, frequency, hematuria, skin rashes or leg swelling. No unilateral weakness, numbness or tingling sensations. No vision change or hearing loss.  In ED, patient was found to have elevated blood pressure 202/80, negative troponin, BNP 2024, temperature normal, no tachycardia. Hemoglobin dropped from a 12.1 on 05/23/14 to 7.6. Negative FOBT. X-ray showed a mild congestive heart failure pattern patient is admitted to inpatient for the evaluation and treat.  Where does patient live?   At home   Can patient participate in ADLs?  Yes    Review of Systems:   General: no fevers, chills, no changes in body weight, has poor appetite, has fatigue HEENT: no blurry vision, hearing changes or sore throat Pulm: has dyspnea, coughing, no wheezing CV: had chest pain, no palpitations Abd: no nausea, vomiting, abdominal pain, diarrhea, constipation GU: no dysuria, burning on urination, increased urinary frequency, hematuria  Ext: no leg edema Neuro: no unilateral weakness, numbness, or tingling, no vision  change or hearing loss Skin: no rash MSK: No muscle spasm, no deformity, no limitation of range of movement in spin Heme: No easy bruising.  Travel history: No recent long distant travel.  Allergy:  Allergies  Allergen Reactions  . Cephalexin Other (See Comments)    Reaction unknown  . Sulfamethoxazole-Trimethoprim Other (See Comments)    Thrush    Past Medical History  Diagnosis Date  . DM (diabetes mellitus)     Long-term insulin  . Hypertension   . Tobacco use disorder     Discontinued March 2012  . Hyperlipidemia     Hypertriglyceridemia 449 HDL 25  . Diabetic Charcot's joint disease   . Coronary artery disease     Status post cardiac catheterization June 2012 scattered coronary artery disease/atherosclerosis with 70-80% stenosis in a small right PDA.  . Gastroparesis   . Peripheral vascular disease     Tibial occlusive disease evaluated by Dr. Kellie Simmering in August 2011. Medical therapy  . 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 CIN III (cervical intraepithelial neoplasia grade III) with severe dysplasia     S/P LEEP AND CONE  . Chronic anemia     2nd to renal disease  . End stage renal disease on dialysis 05/02/11    [facility]; M; W, F; last time 05/01/11  . Migraines     "just on dialysis days"  . Peripheral neuropathy     related to DM  . History of abscesses in groins 12/06/2010    Past Surgical History  Procedure Laterality Date  . Av fistula placement  04/2010  . Dilation and curettage of uterus  2009  . Cervical biopsy  w/ loop electrode excision      h/o  .  Cervical cone biopsy      h/o  . Colposcopy      Social History:  reports that she quit smoking about 3 years ago. Her smoking use included Cigarettes. She has a 3 pack-year smoking history. She has never used smokeless tobacco. She reports that she does not drink alcohol or use illicit drugs.  Family History:  Family History  Problem Relation Age of Onset  . Diabetes Mother   . Hypertension Mother   . Diabetes  Father   . Hyperlipidemia Father   . Hypertension Father   . Diabetes Sister   . Breast cancer Maternal Aunt     Age 15's  . Stroke Maternal Grandmother   . Diabetes Sister      Prior to Admission medications   Medication Sig Start Date End Date Taking? Authorizing Provider  amLODipine (NORVASC) 10 MG tablet Take 1 tablet (10 mg total) by mouth daily. 07/05/13  Yes Minus Breeding, MD  atorvastatin (LIPITOR) 80 MG tablet Take 1 tablet (80 mg total) by mouth every evening. 02/06/12  Yes Donney Dice, PA-C  chlorpheniramine-HYDROcodone (TUSSIONEX PENNKINETIC ER) 10-8 MG/5ML SUER Take 5 mLs by mouth 2 (two) times daily. 10/29/14  Yes Roselee Culver, MD  cinacalcet (SENSIPAR) 30 MG tablet Take 30 mg by mouth daily.   Yes Historical Provider, MD  cloNIDine (CATAPRES) 0.1 MG tablet Take 0.1 mg by mouth 2 (two) times daily.   Yes Historical Provider, MD  clopidogrel (PLAVIX) 75 MG tablet Take 75 mg by mouth daily with breakfast.   Yes Historical Provider, MD  gabapentin (NEURONTIN) 300 MG capsule Take 300 mg by mouth every evening.  06/19/13  Yes Historical Provider, MD  HUMALOG MIX 50/50 KWIKPEN (50-50) 100 UNIT/ML Kwikpen Inject 26-28 Units into the skin 2 (two) times daily. 28 in the morning and 26 in the evening. 04/29/14  Yes Historical Provider, MD  HYDROcodone-acetaminophen (NORCO) 5-325 MG per tablet Take 1 tablet by mouth every 6 (six) hours as needed for moderate pain or severe pain. 05/24/14  Yes Clayton Bibles, PA-C  isosorbide mononitrate (IMDUR) 120 MG 24 hr tablet Take 120 mg by mouth daily.   Yes Historical Provider, MD  labetalol (NORMODYNE) 300 MG tablet Take 1 tablet (300 mg total) by mouth 3 (three) times daily. Patient taking differently: Take 300 mg by mouth 2 (two) times daily.  05/15/12  Yes Minus Breeding, MD  levofloxacin (LEVAQUIN) 500 MG tablet Take 1 tablet (500 mg total) by mouth daily. Patient taking differently: Take 500 mg by mouth daily. Started medication on 10-29-14  10/29/14 11/08/14 Yes Roselee Culver, MD  nitroGLYCERIN (NITROSTAT) 0.4 MG SL tablet Place 0.4 mg under the tongue every 5 (five) minutes as needed. For chest pain   Yes Historical Provider, MD  ondansetron (ZOFRAN) 4 MG tablet Take 1 tablet (4 mg total) by mouth every 8 (eight) hours as needed for nausea or vomiting. 05/24/14  Yes Clayton Bibles, PA-C  RENVELA 800 MG tablet Take 1 tablet by mouth 4 (four) times daily - after meals and at bedtime. 04/17/13  Yes Historical Provider, MD    Physical Exam: Filed Vitals:   10/30/14 1901 10/30/14 2000 10/30/14 2015 10/30/14 2100  BP: 189/79 173/69  161/74  Pulse: 81 79 82 78  Temp:      TempSrc:      Resp: 28 18 19 21   Height:      Weight:      SpO2: 96% 93% 100% 100%  General: Not in acute distress HEENT:       Eyes: PERRL, EOMI, no scleral icterus.       ENT: No discharge from the ears and nose, no pharynx injection, no tonsillar enlargement.        Neck: No JVD, no bruit, no mass felt. Heme: No neck lymph node enlargement. Cardiac: S1/S2, RRR, No murmurs, No gallops or rubs. Pulm:  No rales, wheezing, rhonchi or rubs. Abd: Soft, nondistended, nontender, no rebound pain, no organomegaly, BS present. Ext: No pitting leg edema bilaterally. 2+DP/PT pulse bilaterally. LUE AVF functioning. Musculoskeletal: No joint deformities, No joint redness or warmth, no limitation of ROM in spin. Skin: No rashes.  Neuro: Alert, oriented X3, cranial nerves II-XII grossly intact, muscle strength 5/5 in all extremities, sensation to light touch intact.  Psych: Patient is not psychotic, no suicidal or hemocidal ideation.  Labs on Admission:  Basic Metabolic Panel:  Recent Labs Lab 10/30/14 1833  NA 136  K 4.5  CL 98*  CO2 25  GLUCOSE 505*  BUN 54*  CREATININE 9.51*  CALCIUM 8.5*   Liver Function Tests:  Recent Labs Lab 10/30/14 1833  AST 13*  ALT 17  ALKPHOS 56  BILITOT 0.5  PROT 6.5  ALBUMIN 3.1*   No results for input(s): LIPASE,  AMYLASE in the last 168 hours. No results for input(s): AMMONIA in the last 168 hours. CBC:  Recent Labs Lab 10/30/14 1833  WBC 6.1  HGB 7.6*  HCT 23.6*  MCV 97.1  PLT 159   Cardiac Enzymes: No results for input(s): CKTOTAL, CKMB, CKMBINDEX, TROPONINI in the last 168 hours.  BNP (last 3 results)  Recent Labs  10/30/14 1932  BNP 2024.0*    ProBNP (last 3 results) No results for input(s): PROBNP in the last 8760 hours.  CBG: No results for input(s): GLUCAP in the last 168 hours.  Radiological Exams on Admission: Dg Chest 2 View  10/29/2014   CLINICAL DATA:  Cough and shortness of breath.  EXAM: CHEST  2 VIEW  COMPARISON:  None.  FINDINGS: Heart size and pulmonary vascularity are normal. Slight peribronchial thickening on the left. Lungs are otherwise clear. No effusions. No osseous abnormality.  IMPRESSION: Slight bronchitic changes.   Electronically Signed   By: Lorriane Shire M.D.   On: 10/29/2014 15:52   Dg Chest Port 1 View  10/30/2014   CLINICAL DATA:  Shortness of breath and chest pain for 1 week. Symptoms worsening today.  EXAM: PORTABLE CHEST - 1 VIEW  COMPARISON:  10/29/2014  FINDINGS: The heart is mildly enlarged. There is vascular congestion and interstitial pulmonary edema. No pleural effusions. The bony thorax is intact.  IMPRESSION: Mild CHF.   Electronically Signed   By: Marijo Sanes M.D.   On: 10/30/2014 19:25    EKG: Independently reviewed.  Abnormal findings: QTC 522, there is old T wave inversion in lateral leads. ST depression in lead 2, V5 and V6. Very mild ST elevation in V1 and V2, and aVR, which is new.  Assessment/Plan Principal Problem:   Shortness of breath Active Problems:   Essential hypertension, benign   End stage renal disease on dialysis   Coronary artery disease   Hyperlipidemia   CIN III (cervical intraepithelial neoplasia grade III) with severe dysplasia   Chest tightness   Symptomatic anemia  SOB: Etiology is not clear. ED thought  this is likely due to symptomatic anemia, 1 unit for blood was ordered by ED. Other DDs include fluid overload (  intravascular fluid overload since patient does not have any leg edema), or flush of pulmonary edema secondary to significantly elevated blood pressure. Another possibility is pulmonary embolism, however patient does not have chest pain currently, oxygen saturation is 100% on room air. Her bp has improved to 161/74 when I saw patient in ED. She refused NTG patches due to causing her headache.  -will admit to SDU -bp control: Increase clonidine dose from 0.1 to 0.2 twice a day, hydralazine when necessary, amlodipine 10 mg daily, labetalol 300 mg twice a day -Received 1 dose of Lasix 40 mg 1 given by ED -due for HD today, left message to renal for HD  CAD: had chest tightness, with new mild ST elevation in V1-V2 and aVR. I called on-call cardiology fellow, Dr. Tommi Rumps and asked him to have reviewed EKG. Per Dr. Tommi Rumps, this is not STEMI and can be safely monitored by cycling troponin at this moment. -will continue Plavix, Lipitor, nitroglycerin when necessary, morphine when necessary, Labetalolol -Troponin 3 -2-D echo  Anemia: Secondary to end-stage renal disease. Hemoglobin dropped from 12.1 to 7.6. FOBT negative.  -anemia panel -Ed ordered 1 unit of blood due to SOB  End stage renal disease on dialysis (MWF): -left message to renal for HD today  HLD: Last LDL was 62 on 01/27/12 -Continue home medications: Lipitor  DVT ppx: SQ Heparin    Code Status: Full code Family Communication:  Yes, patient's husband at bed side Disposition Plan: Admit to inpatient   Date of Service 10/30/2014    Ivor Costa [facility] Pager [phone]  If 7PM-7AM, please contact night-coverage [email or web address] Password [facility] 10/30/2014, 9:57 PM

## 2014-10-31 ENCOUNTER — Inpatient Hospital Stay (HOSPITAL_COMMUNITY): Payer: Medicare Other

## 2014-10-31 ENCOUNTER — Encounter (HOSPITAL_COMMUNITY): Payer: Self-pay

## 2014-10-31 DIAGNOSIS — D638 Anemia in other chronic diseases classified elsewhere: Secondary | ICD-10-CM

## 2014-10-31 DIAGNOSIS — R0602 Shortness of breath: Secondary | ICD-10-CM

## 2014-10-31 DIAGNOSIS — Z992 Dependence on renal dialysis: Secondary | ICD-10-CM

## 2014-10-31 DIAGNOSIS — R7989 Other specified abnormal findings of blood chemistry: Secondary | ICD-10-CM

## 2014-10-31 DIAGNOSIS — N186 End stage renal disease: Secondary | ICD-10-CM

## 2014-10-31 LAB — GLUCOSE, CAPILLARY
GLUCOSE-CAPILLARY: 113 mg/dL — AB (ref 65–99)
GLUCOSE-CAPILLARY: 153 mg/dL — AB (ref 65–99)
GLUCOSE-CAPILLARY: 273 mg/dL — AB (ref 65–99)
Glucose-Capillary: 144 mg/dL — ABNORMAL HIGH (ref 65–99)
Glucose-Capillary: 115 mg/dL — ABNORMAL HIGH (ref 65–99)

## 2014-10-31 LAB — BASIC METABOLIC PANEL
Anion gap: 14 (ref 5–15)
BUN: 57 mg/dL — ABNORMAL HIGH (ref 6–20)
CHLORIDE: 100 mmol/L — AB (ref 101–111)
CO2: 26 mmol/L (ref 22–32)
Calcium: 8.8 mg/dL — ABNORMAL LOW (ref 8.9–10.3)
Creatinine, Ser: 9.83 mg/dL — ABNORMAL HIGH (ref 0.44–1.00)
GFR calc Af Amer: 5 mL/min — ABNORMAL LOW (ref >60)
GFR, EST NON AFRICAN AMERICAN: 4 mL/min — AB (ref >60)
Glucose, Bld: 117 mg/dL — ABNORMAL HIGH (ref 65–99)
Potassium: 4.3 mmol/L (ref 3.5–5.1)
Sodium: 140 mmol/L (ref 135–145)

## 2014-10-31 LAB — MRSA PCR SCREENING: MRSA by PCR: NEGATIVE

## 2014-10-31 LAB — PROTIME-INR
INR: 1.11 (ref 0.00–1.49)
Prothrombin Time: 14.4 seconds (ref 11.6–15.2)

## 2014-10-31 LAB — RETICULOCYTES
RBC.: 2.48 MIL/uL — ABNORMAL LOW (ref 3.87–5.11)
RETIC COUNT ABSOLUTE: 19.8 10*3/uL (ref 19.0–186.0)
RETIC CT PCT: 0.8 % (ref 0.4–3.1)

## 2014-10-31 LAB — CBC
HEMATOCRIT: 23.7 % — AB (ref 36.0–46.0)
HEMOGLOBIN: 7.7 g/dL — AB (ref 12.0–15.0)
MCH: 31 pg (ref 26.0–34.0)
MCHC: 32.5 g/dL (ref 30.0–36.0)
MCV: 95.6 fL (ref 78.0–100.0)
Platelets: 145 10*3/uL — ABNORMAL LOW (ref 150–400)
RBC: 2.48 MIL/uL — AB (ref 3.87–5.11)
RDW: 14.1 % (ref 11.5–15.5)
WBC: 7 10*3/uL (ref 4.0–10.5)

## 2014-10-31 LAB — TROPONIN I
TROPONIN I: 0.09 ng/mL — AB (ref <0.031)
Troponin I: 0.06 ng/mL — ABNORMAL HIGH (ref <0.031)

## 2014-10-31 LAB — IRON AND TIBC
IRON: 124 ug/dL (ref 28–170)
SATURATION RATIOS: 79 % — AB (ref 10.4–31.8)
TIBC: 157 ug/dL — AB (ref 250–450)
UIBC: 33 ug/dL

## 2014-10-31 LAB — VITAMIN B12: Vitamin B-12: 322 pg/mL (ref 180–914)

## 2014-10-31 LAB — FERRITIN: Ferritin: 780 ng/mL — ABNORMAL HIGH (ref 11–307)

## 2014-10-31 LAB — APTT: APTT: 32 s (ref 24–37)

## 2014-10-31 LAB — FOLATE: Folate: 5.7 ng/mL — ABNORMAL LOW (ref >5.9)

## 2014-10-31 MED ORDER — ACETAMINOPHEN 325 MG PO TABS
650.0000 mg | ORAL_TABLET | Freq: Four times a day (QID) | ORAL | Status: DC | PRN
  Administered 2014-11-01: 650 mg via ORAL
  Filled 2014-10-31: qty 2

## 2014-10-31 MED ORDER — INSULIN ASPART 100 UNIT/ML ~~LOC~~ SOLN: 0.0000 [IU] | Freq: Three times a day (TID) | SUBCUTANEOUS | Status: DC

## 2014-10-31 MED ORDER — CLONIDINE HCL 0.2 MG PO TABS
0.2000 mg | ORAL_TABLET | Freq: Two times a day (BID) | ORAL | Status: DC
  Administered 2014-10-31 (×3): 0.2 mg via ORAL
  Filled 2014-10-31 (×5): qty 1

## 2014-10-31 MED ORDER — INSULIN ASPART PROT & ASPART (70-30 MIX) 100 UNIT/ML ~~LOC~~ SUSP
26.0000 [IU] | Freq: Every day | SUBCUTANEOUS | Status: DC
  Administered 2014-10-31 – 2014-11-01 (×2): 26 [IU] via SUBCUTANEOUS
  Filled 2014-10-31: qty 10

## 2014-10-31 MED ORDER — LABETALOL HCL 300 MG PO TABS
300.0000 mg | ORAL_TABLET | Freq: Two times a day (BID) | ORAL | Status: DC
  Administered 2014-10-31 – 2014-11-01 (×2): 300 mg via ORAL
  Filled 2014-10-31 (×5): qty 1

## 2014-10-31 MED ORDER — SEVELAMER CARBONATE 800 MG PO TABS: 800.0000 mg | ORAL_TABLET | Freq: Three times a day (TID) | ORAL | Status: DC

## 2014-10-31 MED ORDER — SODIUM CHLORIDE 0.9 % IV SOLN: 100.0000 mL | INTRAVENOUS | Status: DC | PRN

## 2014-10-31 MED ORDER — NITROGLYCERIN 0.4 MG SL SUBL: 0.4000 mg | SUBLINGUAL_TABLET | SUBLINGUAL | Status: DC | PRN

## 2014-10-31 MED ORDER — ISOSORBIDE MONONITRATE ER 60 MG PO TB24
120.0000 mg | ORAL_TABLET | Freq: Every day | ORAL | Status: DC
  Administered 2014-10-31 – 2014-11-01 (×2): 120 mg via ORAL
  Filled 2014-10-31 (×2): qty 2

## 2014-10-31 MED ORDER — DARBEPOETIN ALFA 100 MCG/0.5ML IJ SOSY
PREFILLED_SYRINGE | INTRAMUSCULAR | Status: AC
  Administered 2014-10-31: 100 ug via INTRAVENOUS
  Filled 2014-10-31: qty 0.5

## 2014-10-31 MED ORDER — SODIUM CHLORIDE 0.9 % IJ SOLN
3.0000 mL | Freq: Two times a day (BID) | INTRAMUSCULAR | Status: DC
  Administered 2014-10-31 – 2014-11-01 (×3): 3 mL via INTRAVENOUS

## 2014-10-31 MED ORDER — CINACALCET HCL 30 MG PO TABS
30.0000 mg | ORAL_TABLET | Freq: Every day | ORAL | Status: DC
  Administered 2014-10-31: 30 mg via ORAL
  Filled 2014-10-31 (×3): qty 1

## 2014-10-31 MED ORDER — SODIUM CHLORIDE 0.9 % IV SOLN
INTRAVENOUS | Status: DC
  Administered 2014-11-01: 06:00:00 via INTRAVENOUS

## 2014-10-31 MED ORDER — ACETAMINOPHEN 650 MG RE SUPP: 650.0000 mg | Freq: Four times a day (QID) | RECTAL | Status: DC | PRN

## 2014-10-31 MED ORDER — LIDOCAINE HCL (PF) 1 % IJ SOLN: 5.0000 mL | INTRAMUSCULAR | Status: DC | PRN

## 2014-10-31 MED ORDER — ASPIRIN 81 MG PO CHEW
81.0000 mg | CHEWABLE_TABLET | ORAL | Status: AC
  Administered 2014-11-01: 81 mg via ORAL
  Filled 2014-10-31: qty 1

## 2014-10-31 MED ORDER — HYDROCOD POLST-CPM POLST ER 10-8 MG/5ML PO SUER
5.0000 mL | Freq: Two times a day (BID) | ORAL | Status: DC
  Filled 2014-10-31 (×3): qty 5

## 2014-10-31 MED ORDER — HYDRALAZINE HCL 20 MG/ML IJ SOLN
5.0000 mg | INTRAMUSCULAR | Status: DC | PRN
  Administered 2014-10-31 (×2): 5 mg via INTRAVENOUS
  Filled 2014-10-31 (×2): qty 1

## 2014-10-31 MED ORDER — SODIUM CHLORIDE 0.9 % IJ SOLN
3.0000 mL | Freq: Two times a day (BID) | INTRAMUSCULAR | Status: DC
  Administered 2014-11-01: 3 mL via INTRAVENOUS

## 2014-10-31 MED ORDER — CLOPIDOGREL BISULFATE 75 MG PO TABS
75.0000 mg | ORAL_TABLET | Freq: Every day | ORAL | Status: DC
  Administered 2014-10-31: 75 mg via ORAL
  Filled 2014-10-31 (×2): qty 1

## 2014-10-31 MED ORDER — ALTEPLASE 2 MG IJ SOLR
2.0000 mg | Freq: Once | INTRAMUSCULAR | Status: AC | PRN
  Filled 2014-10-31: qty 2

## 2014-10-31 MED ORDER — CLOPIDOGREL BISULFATE 75 MG PO TABS
75.0000 mg | ORAL_TABLET | Freq: Every day | ORAL | Status: DC
  Filled 2014-10-31 (×2): qty 1

## 2014-10-31 MED ORDER — SODIUM CHLORIDE 0.9 % IV SOLN: 250.0000 mL | INTRAVENOUS | Status: DC | PRN

## 2014-10-31 MED ORDER — SEVELAMER CARBONATE 800 MG PO TABS
800.0000 mg | ORAL_TABLET | Freq: Three times a day (TID) | ORAL | Status: DC
  Administered 2014-10-31: 800 mg via ORAL
  Filled 2014-10-31 (×6): qty 1

## 2014-10-31 MED ORDER — AMLODIPINE BESYLATE 10 MG PO TABS
10.0000 mg | ORAL_TABLET | Freq: Every day | ORAL | Status: DC
  Administered 2014-10-31: 10 mg via ORAL
  Filled 2014-10-31 (×2): qty 1

## 2014-10-31 MED ORDER — HYDROCODONE-ACETAMINOPHEN 5-325 MG PO TABS
1.0000 | ORAL_TABLET | Freq: Four times a day (QID) | ORAL | Status: DC | PRN
  Administered 2014-10-31 – 2014-11-01 (×2): 1 via ORAL
  Filled 2014-10-31 (×2): qty 1

## 2014-10-31 MED ORDER — INSULIN ASPART PROT & ASPART (70-30 MIX) 100 UNIT/ML ~~LOC~~ SUSP
28.0000 [IU] | Freq: Every day | SUBCUTANEOUS | Status: DC
  Administered 2014-10-31: 28 [IU] via SUBCUTANEOUS
  Filled 2014-10-31: qty 10

## 2014-10-31 MED ORDER — HEPARIN SODIUM (PORCINE) 1000 UNIT/ML DIALYSIS
20.0000 [IU]/kg | INTRAMUSCULAR | Status: DC | PRN
  Filled 2014-10-31: qty 3

## 2014-10-31 MED ORDER — IOHEXOL 350 MG/ML SOLN
80.0000 mL | Freq: Once | INTRAVENOUS | Status: AC | PRN
  Administered 2014-10-31: 80 mL via INTRAVENOUS

## 2014-10-31 MED ORDER — SODIUM CHLORIDE 0.9 % IJ SOLN: 3.0000 mL | INTRAMUSCULAR | Status: DC | PRN

## 2014-10-31 MED ORDER — PENTAFLUOROPROP-TETRAFLUOROETH EX AERO: 1.0000 "application " | INHALATION_SPRAY | CUTANEOUS | Status: DC | PRN

## 2014-10-31 MED ORDER — HEPARIN SODIUM (PORCINE) 1000 UNIT/ML DIALYSIS
1000.0000 [IU] | INTRAMUSCULAR | Status: DC | PRN
  Filled 2014-10-31: qty 1

## 2014-10-31 MED ORDER — HEPARIN SODIUM (PORCINE) 5000 UNIT/ML IJ SOLN
5000.0000 [IU] | Freq: Three times a day (TID) | INTRAMUSCULAR | Status: DC
  Administered 2014-10-31 – 2014-11-01 (×2): 5000 [IU] via SUBCUTANEOUS
  Filled 2014-10-31 (×6): qty 1

## 2014-10-31 MED ORDER — AMLODIPINE BESYLATE 10 MG PO TABS
10.0000 mg | ORAL_TABLET | Freq: Every day | ORAL | Status: DC
  Filled 2014-10-31: qty 1

## 2014-10-31 MED ORDER — LIDOCAINE-PRILOCAINE 2.5-2.5 % EX CREA: 1.0000 "application " | TOPICAL_CREAM | CUTANEOUS | Status: DC | PRN

## 2014-10-31 MED ORDER — NEPRO/CARBSTEADY PO LIQD
237.0000 mL | ORAL | Status: DC | PRN
  Filled 2014-10-31: qty 237

## 2014-10-31 MED ORDER — DARBEPOETIN ALFA 100 MCG/0.5ML IJ SOSY
100.0000 ug | PREFILLED_SYRINGE | INTRAMUSCULAR | Status: DC
  Administered 2014-10-31: 100 ug via INTRAVENOUS
  Filled 2014-10-31: qty 0.5

## 2014-10-31 MED ORDER — DOXERCALCIFEROL 4 MCG/2ML IV SOLN
INTRAVENOUS | Status: AC
  Administered 2014-10-31: 5 ug via INTRAVENOUS
  Filled 2014-10-31: qty 4

## 2014-10-31 MED ORDER — SEVELAMER CARBONATE 800 MG PO TABS
4800.0000 mg | ORAL_TABLET | Freq: Three times a day (TID) | ORAL | Status: DC
  Administered 2014-10-31 – 2014-11-01 (×2): 4800 mg via ORAL
  Filled 2014-10-31 (×5): qty 6

## 2014-10-31 MED ORDER — HYDROXYZINE HCL 50 MG/ML IM SOLN
25.0000 mg | Freq: Four times a day (QID) | INTRAMUSCULAR | Status: DC | PRN
  Filled 2014-10-31: qty 0.5

## 2014-10-31 MED ORDER — DOXERCALCIFEROL 4 MCG/2ML IV SOLN
5.0000 ug | INTRAVENOUS | Status: DC
Start: 2014-10-31 — End: 2014-11-01
  Administered 2014-10-31: 5 ug via INTRAVENOUS
  Filled 2014-10-31: qty 4

## 2014-10-31 MED ORDER — GABAPENTIN 300 MG PO CAPS
300.0000 mg | ORAL_CAPSULE | Freq: Every evening | ORAL | Status: DC
  Filled 2014-10-31 (×3): qty 1

## 2014-10-31 MED ORDER — ATORVASTATIN CALCIUM 80 MG PO TABS
80.0000 mg | ORAL_TABLET | Freq: Every evening | ORAL | Status: DC
  Administered 2014-10-31: 80 mg via ORAL
  Filled 2014-10-31 (×3): qty 1

## 2014-10-31 MED ORDER — SODIUM CHLORIDE 0.9 % IV SOLN: 62.5000 mg | INTRAVENOUS | Status: DC

## 2014-10-31 MED ORDER — GUAIFENESIN-DM 100-10 MG/5ML PO SYRP: 5.0000 mL | ORAL_SOLUTION | ORAL | Status: DC | PRN

## 2014-10-31 MED ORDER — MORPHINE SULFATE 2 MG/ML IJ SOLN: 2.0000 mg | INTRAMUSCULAR | Status: DC | PRN

## 2014-10-31 NOTE — Progress Notes (Signed)
PT refusing Bp med " stating that she is going to HD in the am and that correct this issue". Pt educated that she could have complications from maintaning the high bp. NP made aware. Will continue to monitor pt.

## 2014-10-31 NOTE — Progress Notes (Signed)
  Echocardiogram 2D Echocardiogram has been performed.  [name] 10/31/2014, 12:49 PM

## 2014-10-31 NOTE — Progress Notes (Signed)
PT Cancellation Note  Patient Details Name: [name] MRN: [id number] DOB: Jun 26, 1975   Cancelled Treatment:    Reason Eval/Treat Not Completed: Patient at procedure or test/unavailable.  In HD this afternoon.  Will see for eval 6/7 as able. 10/31/2014  Donnella Sham, [place] [phone]  (pager)   Aleria Maheu, Tessie Fass 10/31/2014, 4:54 PM

## 2014-10-31 NOTE — Progress Notes (Addendum)
[facility] Progress Note   [name] WLN:[id number] DOB: 10/02/1975 DOA: 10/30/2014 PCP: Merrilee Seashore, MD  Brief narrative: [name] is a 39 y.o. female with h/o HTN, ESRD on HD, DM type 1, PVD, Gastroparesis, peripheral neuropathy admitted for shortness of breath on laying flat and on exertion. Symptoms have been ongoing for 1 wk- no c/o cough, chest pain, fevers, palpitations. Found to have mildly elevated troponin in ER. Underwent a stress test at Dr Irven Shelling office on Friday. This was low risk but LV was dilated and EF was about 40 %.    Assessment/Plan: Principal Problem:   Shortness of breath/ elevated troponin/ T wave inversion in V4-V6 -dyspnea on exertion and on laying flat suggestive of pulm edema  - CT chest - reveals ground glass infiltrates and non-specific right hilar adenopathy and LV hypertrophy - changes may be related to fluid overload- will revaluate symptoms after dialysis - Dr Einar Gip will evaluate today and evaluate for need for cath- on ASA and Plavix  - f/u on ECHO   Active Problems: RUL nodule - 5 mm- repeat CT in 6-12 months    Essential hypertension, benign - BP quite elevated- patient refused medications this AM stating the dialysis would resolve her HTN    End stage renal disease on dialysis - undergoing dialysis at this time    Symptomatic anemia- likely due to CKD - management per renal  DM type 1 - on 50/50 insulin at home which is not available in the hospital - transitioned to 70/30 - follow sugars   Code Status: full code Family Communication:  Disposition Plan: follow in SDU DVT prophylaxis: Heparin Consultants:Nephrology, cardiology Procedures:  Antibiotics: Anti-infectives    None      Objective: Filed Weights   10/30/14 1836 10/31/14 0052 10/31/14 0500  Weight: 108.863 kg (240 lb) 110.4 kg (243 lb 6.2 oz) 110.6 kg (243 lb 13.3 oz)    Intake/Output Summary (Last 24 hours) at 10/31/14 1153 Last data  filed at 10/31/14 0156  Gross per 24 hour  Intake    378 ml  Output      0 ml  Net    378 ml     Vitals Filed Vitals:   10/31/14 0630 10/31/14 0700 10/31/14 0730 10/31/14 0800  BP: 167/71 188/78 170/81 186/89  Pulse: 71 77 72   Temp:   98 F (36.7 C)   TempSrc:   Oral   Resp: 21 16 17 18   Height:      Weight:      SpO2: 95% 93% 98%     Exam:  General:  Pt is alert, not in acute distress  HEENT: No icterus, No thrush, oral mucosa moist  Cardiovascular: regular rate and rhythm, S1/S2 No murmur  Respiratory: clear to auscultation bilaterally - 98% on room air  Abdomen: Soft, +Bowel sounds, non tender, non distended, no guarding  MSK: No LE edema, cyanosis or clubbing  Data Reviewed: Basic Metabolic Panel:  Recent Labs Lab 10/30/14 1833 10/31/14 0405  NA 136 140  K 4.5 4.3  CL 98* 100*  CO2 25 26  GLUCOSE 505* 117*  BUN 54* 57*  CREATININE 9.51* 9.83*  CALCIUM 8.5* 8.8*   Liver Function Tests:  Recent Labs Lab 10/30/14 1833  AST 13*  ALT 17  ALKPHOS 56  BILITOT 0.5  PROT 6.5  ALBUMIN 3.1*   No results for input(s): LIPASE, AMYLASE in the last 168 hours. No results for input(s): AMMONIA in the last 168  hours. CBC:  Recent Labs Lab 10/30/14 1833 10/31/14 0405  WBC 6.1 7.0  HGB 7.6* 7.7*  HCT 23.6* 23.7*  MCV 97.1 95.6  PLT 159 145*   Cardiac Enzymes:  Recent Labs Lab 10/30/14 2216 10/31/14 0405  TROPONINI 0.08* 0.09*   BNP (last 3 results)  Recent Labs  10/30/14 1932  BNP 2024.0*    ProBNP (last 3 results) No results for input(s): PROBNP in the last 8760 hours.  CBG:  Recent Labs Lab 10/31/14 0059 10/31/14 0729  GLUCAP 153* 144*    Recent Results (from the past 240 hour(s))  MRSA PCR Screening     Status: None   Collection Time: 10/31/14  2:22 AM  Result Value Ref Range Status   MRSA by PCR NEGATIVE NEGATIVE Final    Comment:        The GeneXpert MRSA Assay (FDA approved for NASAL specimens only), is one  component of a comprehensive MRSA colonization surveillance program. It is not intended to diagnose MRSA infection nor to guide or monitor treatment for MRSA infections.      Studies: Dg Chest 2 View  10/29/2014   CLINICAL DATA:  Cough and shortness of breath.  EXAM: CHEST  2 VIEW  COMPARISON:  None.  FINDINGS: Heart size and pulmonary vascularity are normal. Slight peribronchial thickening on the left. Lungs are otherwise clear. No effusions. No osseous abnormality.  IMPRESSION: Slight bronchitic changes.   Electronically Signed   By: Lorriane Shire M.D.   On: 10/29/2014 15:52   Ct Angio Chest Pe W/cm &/or Wo Cm  10/31/2014   CLINICAL DATA:  Dyspnea and shortness of breath, history hypertension, diabetes, former smoker, coronary artery disease, hyperlipidemia  EXAM: CT ANGIOGRAPHY CHEST WITH CONTRAST  TECHNIQUE: Multidetector CT imaging of the chest was performed using the standard protocol during bolus administration of intravenous contrast. Multiplanar CT image reconstructions and MIPs were obtained to evaluate the vascular anatomy.  CONTRAST:  61mL OMNIPAQUE IOHEXOL 350 MG/ML SOLN IV  COMPARISON:  None  FINDINGS: Scattered atherosclerotic calcification coronary arteries, aorta and proximal great vessels.  Aorta normal caliber without aneurysm or dissection.  Enlarged RIGHT paratracheal 4R node 17 mm short axis image 28.  Upper normal sized level 6 para-aortic/ AP window nodes.  Minimally enlarged RIGHT hilar 10R node 13 mm short axis image 38.  Additional scattered normal and upper normal sized inferior cervical, prevascular, RIGHT paratracheal and subcarinal nodes.  Pulmonary arteries adequately opacified and patent.  No evidence of pulmonary embolism.  Thickening of intraventricular septum and LEFT ventricular myocardium consistent with LEFT ventricular hypertrophy.  Visualized upper abdomen normal appearance.  Small BILATERAL pleural effusions.  Scattered ground-glass infiltrates and thickening  of interlobular septa in both lungs.  5 mm RIGHT upper lobe nodule image 32.  No additional mass/ nodule, segmental consolidation, or pneumothorax.  Osseous structures unremarkable.  Review of the MIP images confirms the above findings.  IMPRESSION: No evidence of pulmonary embolism.  LEFT ventricular hypertrophy.  Scattered BILATERAL ground-glass infiltrates and thickening of interlobular septa which could be due to edema or infection.  Small associated BILATERAL pleural effusions as well as nonspecific RIGHT hilar and mediastinal adenopathy of uncertain significance; consider followup CT imaging in 6 months to reassess thoracic nodes.  Did 5 mm RIGHT upper lobe nodule, recommendation below.  If the patient is at high risk for bronchogenic carcinoma, follow-up chest CT at 6-12 months is recommended. If the patient is at low risk for bronchogenic carcinoma, follow-up chest CT at  12 months is recommended. This recommendation follows the consensus statement: Guidelines for Management of Small Pulmonary Nodules Detected on CT Scans: A Statement from the [place] as published in Radiology 2005;237:395-400.   Electronically Signed   By: Lavonia Dana M.D.   On: 10/31/2014 09:27   Dg Chest Port 1 View  10/30/2014   CLINICAL DATA:  Shortness of breath and chest pain for 1 week. Symptoms worsening today.  EXAM: PORTABLE CHEST - 1 VIEW  COMPARISON:  10/29/2014  FINDINGS: The heart is mildly enlarged. There is vascular congestion and interstitial pulmonary edema. No pleural effusions. The bony thorax is intact.  IMPRESSION: Mild CHF.   Electronically Signed   By: Marijo Sanes M.D.   On: 10/30/2014 19:25    Scheduled Meds:  Scheduled Meds: . amLODipine  10 mg Oral Daily  . atorvastatin  80 mg Oral QPM  . chlorpheniramine-HYDROcodone  5 mL Oral BID  . cinacalcet  30 mg Oral Q breakfast  . cloNIDine  0.2 mg Oral BID  . clopidogrel  75 mg Oral QHS  . darbepoetin (ARANESP) injection - DIALYSIS  100 mcg  Intravenous Q Mon-HD  . doxercalciferol  5 mcg Intravenous Q M,W,F-HD  . [START ON 11/02/2014] ferric gluconate (FERRLECIT/NULECIT) IV  62.5 mg Intravenous Q Wed-HD  . gabapentin  300 mg Oral QPM  . heparin  5,000 Units Subcutaneous 3 times per day  . insulin aspart  0-15 Units Subcutaneous TID WC  . insulin aspart protamine- aspart  26 Units Subcutaneous Q supper  . insulin aspart protamine- aspart  28 Units Subcutaneous Q breakfast  . isosorbide mononitrate  120 mg Oral Daily  . labetalol  300 mg Oral BID  . sevelamer carbonate  800 mg Oral TID PC & HS  . sodium chloride  3 mL Intravenous Q12H   Continuous Infusions:   Time spent on care of this patient: 35 min   [place], MD 10/31/2014, 11:53 AM  LOS: 1 day   [facility] Office  [phone] Pager - Text Page per [email or web address] If 7PM-7AM, please contact night-coverage [email or web address]

## 2014-10-31 NOTE — Progress Notes (Signed)
Inpatient Diabetes Program Recommendations  AACE/ADA: New Consensus Statement on Inpatient Glycemic Control (2013)  Target Ranges:  Prepandial:   less than 140 mg/dL      Peak postprandial:   less than 180 mg/dL (1-2 hours)      Critically ill patients:  140 - 180 mg/dL   Noted pt refused BP medication and correction insulin this am. Pt noted to have mentioned that his issue would get better once he went to HD today. Will see pt if appropriate   Thank you Rosita Kea, RN, MSN, CDE  Diabetes Inpatient Program Office: [phone] Pager: [phone] 8:00 am to 5:00 pm

## 2014-10-31 NOTE — Procedures (Signed)
I was present at this session.  I have reviewed the session itself and made appropriate changes.BP ^, lower dry.  Access press ok.  JD  [name] L 6/6/201611:09 AM

## 2014-10-31 NOTE — Progress Notes (Signed)
Hemodialysis- pt signed off AMA with 45 minutes remaining of a 4 hour treatment. Stated reason was "headache." Offered tylenol, pt refused. Treatment discontinued without issue. UF =3.8 L out of 5L goal. MD notified. Vitals stable post treatment. Report called to 3S.

## 2014-10-31 NOTE — Consult Note (Signed)
[facility] Renal Consultation Note    Indication for Consultation:  Management of ESRD/hemodialysis; anemia, hypertension/volume and secondary hyperparathyroidism  HPI: [name] is a 39 y.o. caucasian female with ESRD secondary to DM on HD for four years who dialyzes MWF at [facility] presents with worsening SOB and orthopnea that has been going on for a week. .Her past medical hisotry is also significant for HTN and CAD/hx NSTEMI.  Her last hemodialysis was Friday - she signed off after 2.25 hours with a post weight of 107.1 (EDW 106.5 net UF 0.5) BP were elevated 179/111 sitting - no standing BPs were done. Of her last 5 dialysis treatments, she ran the full time on three occasions.  Her lowest post HD weight was 105.8 5/25. Her reason for signing off is severe headaches.  She is not requiring O2. She denies fever, cough (reported earlier). She is restless and uncomfortable in the hospital bed.  At present she has no CP, chest tightness, N, V, D.or headaches.  She still makes some urine. She is a former smoker. She was seen at [facility] 6/[facility]. CXR showed bronchitic changes and pt was started on levaquin 500 per day and tussionex.  She presented to the ED yesterday and was admitted with SOB. HGb was found to be low at 7.6 and she was transfused 1 unit PRBC( having noted to have steadily trended down the past 6 weeks per outpt records). FOBT neg,  BMP 2024, troponin 0.07 K 4.5 WBC 6.1 tsat 79% done today post transfusion. Serial troponins 0.08, 0.09 and 0.06.  Additional CT angio chest showed ground glass infiltrates ? Fluid related). EKG in the ED showed not significant change compared to prior EKG.  Past Medical History  Diagnosis Date  . DM (diabetes mellitus)     Long-term insulin  . Hypertension   . Tobacco use disorder     Discontinued March 2012  . Hyperlipidemia     Hypertriglyceridemia 449 HDL 25  . Diabetic Charcot's joint disease   . Coronary artery disease      Status post cardiac catheterization June 2012 scattered coronary artery disease/atherosclerosis with 70-80% stenosis in a small right PDA.  . Gastroparesis   . Peripheral vascular disease     Tibial occlusive disease evaluated by Dr. Kellie Simmering in August 2011. Medical therapy  . 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 CIN III (cervical intraepithelial neoplasia grade III) with severe dysplasia     S/P LEEP AND [facility]  . Chronic anemia     2nd to renal disease  . End stage renal disease on dialysis 05/02/11    [facility]; M; W, F; last time 05/01/11  . Migraines     "just on dialysis days"  . Peripheral neuropathy     related to DM  . History of abscesses in groins 12/06/2010  . Renal insufficiency     Dialysis since 2012   Past Surgical History  Procedure Laterality Date  . Av fistula placement  04/2010  . Dilation and curettage of uterus  2009  . Cervical biopsy  w/ loop electrode excision      h/o  . Cervical cone biopsy      h/o  . Colposcopy     Family History  Problem Relation Age of Onset  . Diabetes Mother   . Hypertension Mother   . Diabetes Father   . Hyperlipidemia Father   . Hypertension Father   . Diabetes Sister   . Breast cancer Maternal  Aunt     Age 42's  . Stroke Maternal Grandmother   . Diabetes Sister    Social History:  reports that she quit smoking about 3 years ago. Her smoking use included Cigarettes. She has a 3 pack-year smoking history. She has never used smokeless tobacco. She reports that she does not drink alcohol or use illicit drugs. Allergies  Allergen Reactions  . Cephalexin Other (See Comments)    Reaction unknown  . Sulfamethoxazole-Trimethoprim Other (See Comments)    Thrush   Prior to Admission medications   Medication Sig Start Date End Date Taking? Authorizing Provider  amLODipine (NORVASC) 10 MG tablet Take 1 tablet (10 mg total) by mouth daily. 07/05/13  Yes Minus Breeding, MD  atorvastatin (LIPITOR) 80 MG tablet Take 1 tablet (80 mg total) by mouth  every evening. 02/06/12  Yes Donney Dice, PA-C  chlorpheniramine-HYDROcodone (TUSSIONEX PENNKINETIC ER) 10-8 MG/5ML SUER Take 5 mLs by mouth 2 (two) times daily. 10/29/14  Yes Roselee Culver, MD  cinacalcet (SENSIPAR) 30 MG tablet Take 30 mg by mouth daily.   Yes Historical Provider, MD  cloNIDine (CATAPRES) 0.1 MG tablet Take 0.1 mg by mouth 2 (two) times daily.   Yes Historical Provider, MD  clopidogrel (PLAVIX) 75 MG tablet Take 75 mg by mouth daily with breakfast.   Yes Historical Provider, MD  gabapentin (NEURONTIN) 300 MG capsule Take 300 mg by mouth every evening.  06/19/13  Yes Historical Provider, MD  HUMALOG MIX 50/50 KWIKPEN (50-50) 100 UNIT/ML Kwikpen Inject 26-28 Units into the skin 2 (two) times daily. 28 in the morning and 26 in the evening. 04/29/14  Yes Historical Provider, MD  HYDROcodone-acetaminophen (NORCO) 5-325 MG per tablet Take 1 tablet by mouth every 6 (six) hours as needed for moderate pain or severe pain. 05/24/14  Yes Clayton Bibles, PA-C  isosorbide mononitrate (IMDUR) 120 MG 24 hr tablet Take 120 mg by mouth daily.   Yes Historical Provider, MD  labetalol (NORMODYNE) 300 MG tablet Take 1 tablet (300 mg total) by mouth 3 (three) times daily. Patient taking differently: Take 300 mg by mouth 2 (two) times daily.  05/15/12  Yes Minus Breeding, MD  levofloxacin (LEVAQUIN) 500 MG tablet Take 1 tablet (500 mg total) by mouth daily. Patient taking differently: Take 500 mg by mouth daily. Started medication on 10-29-14 10/29/14 11/08/14 Yes Roselee Culver, MD  nitroGLYCERIN (NITROSTAT) 0.4 MG SL tablet Place 0.4 mg under the tongue every 5 (five) minutes as needed. For chest pain   Yes Historical Provider, MD  ondansetron (ZOFRAN) 4 MG tablet Take 1 tablet (4 mg total) by mouth every 8 (eight) hours as needed for nausea or vomiting. 05/24/14  Yes Clayton Bibles, PA-C  RENVELA 800 MG tablet Take 1 tablet by mouth 4 (four) times daily - after meals and at bedtime. 04/17/13  Yes Historical  Provider, MD   Current Facility-Administered Medications  Medication Dose Route Frequency Provider Last Rate Last Dose  . acetaminophen (TYLENOL) tablet 650 mg  650 mg Oral Q6H PRN Ivor Costa, MD       Or  . acetaminophen (TYLENOL) suppository 650 mg  650 mg Rectal Q6H PRN Ivor Costa, MD      . amLODipine (NORVASC) tablet 10 mg  10 mg Oral QHS Alric Seton, PA-C      . atorvastatin (LIPITOR) tablet 80 mg  80 mg Oral QPM Ivor Costa, MD   80 mg at 10/31/14 0153  . chlorpheniramine-HYDROcodone (TUSSIONEX) 10-8 MG/5ML suspension  5 mL  5 mL Oral BID Ivor Costa, MD   5 mL at 10/31/14 0153  . cinacalcet (SENSIPAR) tablet 30 mg  30 mg Oral Q breakfast Ivor Costa, MD   30 mg at 10/31/14 1025  . cloNIDine (CATAPRES) tablet 0.2 mg  0.2 mg Oral BID Ivor Costa, MD   0.2 mg at 10/31/14 1024  . clopidogrel (PLAVIX) tablet 75 mg  75 mg Oral QHS Debbe Odea, MD      . Darbepoetin Alfa (ARANESP) injection 100 mcg  100 mcg Intravenous Q Mon-HD Alric Seton, PA-C      . doxercalciferol (HECTOROL) injection 5 mcg  5 mcg Intravenous Q M,W,F-HD Alric Seton, PA-C      . Derrill Memo ON 11/02/2014] ferric gluconate (NULECIT) 62.5 mg in sodium chloride 0.9 % 100 mL IVPB  62.5 mg Intravenous Q Wed-HD Alric Seton, PA-C      . gabapentin (NEURONTIN) capsule 300 mg  300 mg Oral QPM Ivor Costa, MD   300 mg at 10/31/14 0154  . guaiFENesin-dextromethorphan (ROBITUSSIN DM) 100-10 MG/5ML syrup 5 mL  5 mL Oral Q4H PRN Ivor Costa, MD      . heparin injection 5,000 Units  5,000 Units Subcutaneous 3 times per day Ivor Costa, MD   5,000 Units at 10/31/14 0600  . hydrALAZINE (APRESOLINE) injection 5 mg  5 mg Intravenous Q2H PRN Ivor Costa, MD   5 mg at 10/31/14 0154  . HYDROcodone-acetaminophen (NORCO/VICODIN) 5-325 MG per tablet 1 tablet  1 tablet Oral Q6H PRN Ivor Costa, MD      . hydrOXYzine (VISTARIL) injection 25 mg  25 mg Intramuscular Q6H PRN Ivor Costa, MD      . insulin aspart protamine- aspart (NOVOLOG MIX 70/30) injection 26 Units   26 Units Subcutaneous Q supper Ivor Costa, MD   26 Units at 10/31/14 0154  . insulin aspart protamine- aspart (NOVOLOG MIX 70/30) injection 28 Units  28 Units Subcutaneous Q breakfast Ivor Costa, MD   28 Units at 10/31/14 1155  . isosorbide mononitrate (IMDUR) 24 hr tablet 120 mg  120 mg Oral Daily Ivor Costa, MD   120 mg at 10/31/14 1025  . labetalol (NORMODYNE) tablet 300 mg  300 mg Oral BID Ivor Costa, MD   300 mg at 10/31/14 0155  . morphine 2 MG/ML injection 2 mg  2 mg Intravenous Q3H PRN Ivor Costa, MD      . nitroGLYCERIN (NITROSTAT) SL tablet 0.4 mg  0.4 mg Sublingual Q5 min PRN Ivor Costa, MD      . sevelamer carbonate (RENVELA) tablet 4,800 mg  4,800 mg Oral TID WC Alric Seton, PA-C      . sodium chloride 0.9 % injection 3 mL  3 mL Intravenous Q12H Ivor Costa, MD   3 mL at 10/31/14 0156   Labs: Basic Metabolic Panel:  Recent Labs Lab 10/30/14 1833 10/31/14 0405  NA 136 140  K 4.5 4.3  CL 98* 100*  CO2 25 26  GLUCOSE 505* 117*  BUN 54* 57*  CREATININE 9.51* 9.83*  CALCIUM 8.5* 8.8*   Liver Function Tests:  Recent Labs Lab 10/30/14 1833  AST 13*  ALT 17  ALKPHOS 56  BILITOT 0.5  PROT 6.5  ALBUMIN 3.1*  CBC:  Recent Labs Lab 10/30/14 1833 10/31/14 0405  WBC 6.1 7.0  HGB 7.6* 7.7*  HCT 23.6* 23.7*  MCV 97.1 95.6  PLT 159 145*   Cardiac Enzymes:  Recent Labs Lab 10/30/14 2216 10/31/14 0405 10/31/14 1050  TROPONINI 0.08* 0.09* 0.06*   CBG:  Recent Labs Lab 10/31/14 0059 10/31/14 0729 10/31/14 1157  GLUCAP 153* 144* 273*   Iron Studies:   Recent Labs  10/31/14 0405  IRON 124  TIBC 157*  FERRITIN 780*   Studies/Results: Dg Chest 2 View  10/29/2014   CLINICAL DATA:  Cough and shortness of breath.  EXAM: CHEST  2 VIEW  COMPARISON:  None.  FINDINGS: Heart size and pulmonary vascularity are normal. Slight peribronchial thickening on the left. Lungs are otherwise clear. No effusions. No osseous abnormality.  IMPRESSION: Slight bronchitic changes.    Electronically Signed   By: Lorriane Shire M.D.   On: 10/29/2014 15:52   Ct Angio Chest Pe W/cm &/or Wo Cm  10/31/2014   CLINICAL DATA:  Dyspnea and shortness of breath, history hypertension, diabetes, former smoker, coronary artery disease, hyperlipidemia  EXAM: CT ANGIOGRAPHY CHEST WITH CONTRAST  TECHNIQUE: Multidetector CT imaging of the chest was performed using the standard protocol during bolus administration of intravenous contrast. Multiplanar CT image reconstructions and MIPs were obtained to evaluate the vascular anatomy.  CONTRAST:  76mL OMNIPAQUE IOHEXOL 350 MG/ML SOLN IV  COMPARISON:  None  FINDINGS: Scattered atherosclerotic calcification coronary arteries, aorta and proximal great vessels.  Aorta normal caliber without aneurysm or dissection.  Enlarged RIGHT paratracheal 4R node 17 mm short axis image 28.  Upper normal sized level 6 para-aortic/ AP window nodes.  Minimally enlarged RIGHT hilar 10R node 13 mm short axis image 38.  Additional scattered normal and upper normal sized inferior cervical, prevascular, RIGHT paratracheal and subcarinal nodes.  Pulmonary arteries adequately opacified and patent.  No evidence of pulmonary embolism.  Thickening of intraventricular septum and LEFT ventricular myocardium consistent with LEFT ventricular hypertrophy.  Visualized upper abdomen normal appearance.  Small BILATERAL pleural effusions.  Scattered ground-glass infiltrates and thickening of interlobular septa in both lungs.  5 mm RIGHT upper lobe nodule image 32.  No additional mass/ nodule, segmental consolidation, or pneumothorax.  Osseous structures unremarkable.  Review of the MIP images confirms the above findings.  IMPRESSION: No evidence of pulmonary embolism.  LEFT ventricular hypertrophy.  Scattered BILATERAL ground-glass infiltrates and thickening of interlobular septa which could be due to edema or infection.  Small associated BILATERAL pleural effusions as well as nonspecific RIGHT hilar and  mediastinal adenopathy of uncertain significance; consider followup CT imaging in 6 months to reassess thoracic nodes.  Did 5 mm RIGHT upper lobe nodule, recommendation below.  If the patient is at high risk for bronchogenic carcinoma, follow-up chest CT at 6-12 months is recommended. If the patient is at low risk for bronchogenic carcinoma, follow-up chest CT at 12 months is recommended. This recommendation follows the consensus statement: Guidelines for Management of Small Pulmonary Nodules Detected on CT Scans: A Statement from the [place] as published in Radiology 2005;237:395-400.   Electronically Signed   By: Lavonia Dana M.D.   On: 10/31/2014 09:27   Dg Chest Port 1 View  10/30/2014   CLINICAL DATA:  Shortness of breath and chest pain for 1 week. Symptoms worsening today.  EXAM: PORTABLE CHEST - 1 VIEW  COMPARISON:  10/29/2014  FINDINGS: The heart is mildly enlarged. There is vascular congestion and interstitial pulmonary edema. No pleural effusions. The bony thorax is intact.  IMPRESSION: Mild CHF.   Electronically Signed   By: Marijo Sanes M.D.   On: 10/30/2014 19:25    ROS: As per HPI otherwise negative.  Physical Exam: Filed Vitals:  10/31/14 0700 10/31/14 0730 10/31/14 0800 10/31/14 1233  BP: 188/78 170/81 186/89   Pulse: 77 72    Temp:  98 F (36.7 C) 98 F (36.7 C) 97.8 F (36.6 C)  TempSrc:  Oral  Oral  Resp: 16 17 18    Height:      Weight:      SpO2: 93% 98%       General: Well developed, well nourished, in no acute distress on room air, supine Head: Normocephalic, atraumatic, sclera non-icteric, mucus membranes are moist Neck: Supple. JVD not elevated. Lungs:  Crackles right 1/2 and lower lung Breathing is unlabored. Heart: RRR 2/6 murmur LV lift Abdomen:  Obese Soft, non-tender, non-distended with normoactive bowel sounds. No rebound/guarding. Liver down 5 cm M-S:  Strength and tone appear normal for age. Lower extremities: tr LE edema or ischemic changes,  no open wounds - extremely ticklish Neuro: Alert and oriented X 3. Moves all extremities spontaneously. Psych:  Responds to questions appropriately -affect anxious/restless Dialysis Access:left AVF + bruit  Dialysis Orders: [facility] MWF 4 hr 180 450/A 1.5 2K 2.25 Ca EDW 106.5 Heparin 3000 Hectorol 5 Venofer 50/week, Mircera 50 q 2 weeks - last given 5/25 Recent labs:  Hgb.  11.8 4/24, 11.3 5/11 10 5/18, 10 5/25 and 8.8 6/1 (Aranesp given 40 4/24 and then again changed to Mircera 50 5/25) 48%sat 5/25 iPTH 314 5/25 P runs 6-7  Assessment/Plan: 1. SOB - suspect multifactorial --a combination of volume and low Hgb; Dr. Einar Gip to see given hx of CAD with NSTEMI 2. ESRD -  MWF - HD today 3. Hypertension/volume  - norvasc 10, clonidine 0.2 bid, labetolol 300 tid plus lower EDW primary issue vol.  4. Anemia  -Hgb has steadily trending down and evaluating labs vs ESA dosing under Dialysis Orders, it appears most of this drop is related to ESA deficit, especially given that she has a tsat of 48% on 5/25. Plan to dose Aranesp 100 today at a higher than previous dose 5. Metabolic bone disease -  Continue Hectorol and binders/sensipar- increase renvela - her outpt dose is 6 tid and 2 with snacks. 6. Nutrition - renal diet/vitmain 7. Lung nodule 5 mm RUL - needs repeat study 6-12 months 8. DM - per primary 9. Nonadherence one of primary issues 10. Chronic intermittent headaches - has been seen by [facility]  Myriam Jacobson, PA-C [facility] [phone] 10/31/2014, 12:48 PM I have seen and examined this patient and agree with the plan of care seen, eval, counseled. Examined.  .  Alphonza Tramell L 10/31/2014, 1:15 PM

## 2014-10-31 NOTE — Procedures (Signed)
I was present at this session.  I have reviewed the session itself and made appropriate changes.  HD via LLA avf, use profile 4 and VNa with HA and lower machine temp.  Vol xs.  Jeanelle Dake L 6/6/20161:13 PM

## 2014-10-31 NOTE — Consult Note (Signed)
CARDIOLOGY CONSULT NOTE  Patient ID: [name] MRN: [id number] DOB/AGE: 07/29/1975 39 y.o.  Admit date: 10/30/2014 Referring Physician  Debbe Odea, MD Primary Physician:  Merrilee Seashore, MD Reason for Consultation  Shortness of breath  HPI: [name]  is a 39 y.o. female  who presents with  Shortness of breath to the hospital. I had seen her about 2 weeks ago when she established with me.  She has chronic renal failure and is presently on hemodialysis and is on transplant list at [facility]. She had been doing well until when she presented to the hospital with shortness of breath and dyspnea on exertion.  Her BNP was elevated, serum troponins have remained flat although mildly abnormal.  I was consulted to further evaluate the patient.  She had undergone stress testing in our office for days ago.  No chest pain, rations.  No dizziness or syncope.  No leg edema.  She does have symptoms suggestive of claudication in bilateral lower extremities. Pertinent medical history includes prior myocardial infarction, hypertension, dyslipidemia, diabetes and peripheral vascular disease (has symptoms of claudication both legs).   Past Medical History  Diagnosis Date  . DM (diabetes mellitus)     Long-term insulin  . Hypertension   . Tobacco use disorder     Discontinued March 2012  . Hyperlipidemia     Hypertriglyceridemia 449 HDL 25  . Diabetic Charcot's joint disease   . Coronary artery disease     Status post cardiac catheterization June 2012 scattered coronary artery disease/atherosclerosis with 70-80% stenosis in a small right PDA.  . Gastroparesis   . Peripheral vascular disease     Tibial occlusive disease evaluated by Dr. Kellie Simmering in August 2011. Medical therapy  . 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 CIN III (cervical intraepithelial neoplasia grade III) with severe dysplasia     S/P LEEP AND CONE  . Chronic anemia     2nd to renal disease  . End stage renal disease on dialysis  05/02/11    [facility]; M; W, F; last time 05/01/11  . Migraines     "just on dialysis days"  . Peripheral neuropathy     related to DM  . History of abscesses in groins 12/06/2010  . Renal insufficiency     Dialysis since 2012     Past Surgical History  Procedure Laterality Date  . Av fistula placement  04/2010  . Dilation and curettage of uterus  2009  . Cervical biopsy  w/ loop electrode excision      h/o  . Cervical cone biopsy      h/o  . Colposcopy       Family History  Problem Relation Age of Onset  . Diabetes Mother   . Hypertension Mother   . Diabetes Father   . Hyperlipidemia Father   . Hypertension Father   . Diabetes Sister   . Breast cancer Maternal Aunt     Age 32's  . Stroke Maternal Grandmother   . Diabetes Sister      Social History: History   Social History  . Marital Status: Married    Spouse Name: [name]  . Number of Children: [name]  . Years of Education: [name]   Occupational History  .  [employer]   Social History Main Topics  . Smoking status: Former Smoker -- 0.30 packs/day for 10 years    Types: Cigarettes    Quit date: 08/05/2011  . Smokeless tobacco: Never Used  Comment: "quit smoking cigarettes 07/2010"  . Alcohol Use: No  . Drug Use: No  . Sexual Activity: Yes    Birth Control/ Protection: Condom   Other Topics Concern  . Not on file   Social History Narrative     Prescriptions prior to admission  Medication Sig Dispense Refill Last Dose  . amLODipine (NORVASC) 10 MG tablet Take 1 tablet (10 mg total) by mouth daily. 30 tablet 0 10/29/2014 at Unknown time  . atorvastatin (LIPITOR) 80 MG tablet Take 1 tablet (80 mg total) by mouth every evening. 30 tablet 3 10/29/2014 at Unknown time  . chlorpheniramine-HYDROcodone (TUSSIONEX PENNKINETIC ER) 10-8 MG/5ML SUER Take 5 mLs by mouth 2 (two) times daily. 60 mL 0 10/29/2014 at Unknown time  . cinacalcet (SENSIPAR) 30 MG tablet Take 30 mg by mouth daily.   10/30/2014 at Unknown time  . cloNIDine  (CATAPRES) 0.1 MG tablet Take 0.1 mg by mouth 2 (two) times daily.   10/30/2014 at Unknown time  . clopidogrel (PLAVIX) 75 MG tablet Take 75 mg by mouth daily with breakfast.   10/29/2014 at Unknown time  . gabapentin (NEURONTIN) 300 MG capsule Take 300 mg by mouth every evening.    10/29/2014 at Unknown time  . HUMALOG MIX 50/50 KWIKPEN (50-50) 100 UNIT/ML Kwikpen Inject 26-28 Units into the skin 2 (two) times daily. 28 in the morning and 26 in the evening.   10/30/2014 at Unknown time  . HYDROcodone-acetaminophen (NORCO) 5-325 MG per tablet Take 1 tablet by mouth every 6 (six) hours as needed for moderate pain or severe pain. 15 tablet 0 Past Week at Unknown time  . isosorbide mononitrate (IMDUR) 120 MG 24 hr tablet Take 120 mg by mouth daily.   10/30/2014 at Unknown time  . labetalol (NORMODYNE) 300 MG tablet Take 1 tablet (300 mg total) by mouth 3 (three) times daily. (Patient taking differently: Take 300 mg by mouth 2 (two) times daily. ) 270 tablet 3 10/30/2014 at Unknown time  . levofloxacin (LEVAQUIN) 500 MG tablet Take 1 tablet (500 mg total) by mouth daily. (Patient taking differently: Take 500 mg by mouth daily. Started medication on 10-29-14) 10 tablet 0 10/30/2014 at Unknown time  . nitroGLYCERIN (NITROSTAT) 0.4 MG SL tablet Place 0.4 mg under the tongue every 5 (five) minutes as needed. For chest pain   unknown at unknown  . ondansetron (ZOFRAN) 4 MG tablet Take 1 tablet (4 mg total) by mouth every 8 (eight) hours as needed for nausea or vomiting. 15 tablet 0 Past Week at Unknown time  . RENVELA 800 MG tablet Take 1 tablet by mouth 4 (four) times daily - after meals and at bedtime.   10/30/2014 at Unknown time     ROS: General: no fevers/chills/night sweats Eyes: no blurry vision, diplopia, or amaurosis ENT: no sore throat or hearing loss Resp: c/o worsening dyspnea over past 3-4 days. No cough, wheezing, or hemoptysis CV: no edema or palpitations, c/o exertional leg cramping both legs GI: no  abdominal pain, nausea, vomiting, diarrhea, or constipation GU: no dysuria, frequency, or hematuria Skin: no rash Neuro: no headache, numbness, tingling, or weakness of extremities Musculoskeletal: no joint pain or swelling Heme: no bleeding, DVT, or easy bruising Endo: no polydipsia or polyuria    Physical Exam: Blood pressure 190/90, pulse 69, temperature 97.7 F (36.5 C), temperature source Oral, resp. rate 16, height 6\' 4"  (1.93 m), weight 107.6 kg (237 lb 3.4 oz), last menstrual period 10/09/2014, SpO2 97 %.  General appearance: alert, cooperative, appears older than stated age and no distress Lungs: clear to auscultation bilaterally Heart: S1, S2 normal, no S3 or S4 and systolic murmur: early systolic 2/6, crescendo at 2nd right intercostal space Abdomen: soft, non-tender; bowel sounds normal; no masses,  no organomegaly and obese Extremities: extremities normal, atraumatic, no cyanosis or edema Pulses:  Femoral pulse- Left- 2+ and Bruit. Right- 2+ and Bruit. Popliteal pulse- Left- 1+. Right- 0+. Dorsalis pedis pulse- Left- 0+ and Absent. Right- 1+. Posterior tibial pulse- Bilateral- Absent. Neurologic: Grossly normal  Labs:   Lab Results  Component Value Date   WBC 7.0 10/31/2014   HGB 7.7* 10/31/2014   HCT 23.7* 10/31/2014   MCV 95.6 10/31/2014   PLT 145* 10/31/2014    Recent Labs Lab 10/30/14 1833 10/31/14 0405  NA 136 140  K 4.5 4.3  CL 98* 100*  CO2 25 26  BUN 54* 57*  CREATININE 9.51* 9.83*  CALCIUM 8.5* 8.8*  PROT 6.5  --   BILITOT 0.5  --   ALKPHOS 56  --   ALT 17  --   AST 13*  --   GLUCOSE 505* 117*    Lipid Panel     Component Value Date/Time   CHOL 117 01/27/2012 0244   TRIG 133 01/27/2012 0244   HDL 28* 01/27/2012 0244   CHOLHDL 4.2 01/27/2012 0244   VLDL 27 01/27/2012 0244   LDLCALC 62 01/27/2012 0244    BNP (last 3 results)  Recent Labs  10/30/14 1932  BNP 2024.0*    ProBNP (last 3 results) No results for input(s):  PROBNP in the last 8760 hours.  HEMOGLOBIN A1C No results found for: HGBA1C, MPG  Cardiac Panel (last 3 results)  Recent Labs  10/30/14 2216 10/31/14 0405 10/31/14 1050  TROPONINI 0.08* 0.09* 0.06*    Lab Results  Component Value Date   CKTOTAL 84 09/23/2011   CKMB 4.5* 09/23/2011   TROPONINI 0.06* 10/31/2014     TSH No results for input(s): TSH in the last 8760 hours.  EKG:  Normal sinus rhythm at rate of 74 bpm, left atrial enlargement, left ventricular hypertrophy with repolarization of normality, cannot exclude lateral ischemia.  Echo: 10/31/2014: Normal LV systolic function, moderate LVH, grade 2 diastolic dysfunction, no significant valvular abnormalities.  Left pleural effusion.   Radiology:   Scheduled Meds: . amLODipine  10 mg Oral QHS  . atorvastatin  80 mg Oral QPM  . chlorpheniramine-HYDROcodone  5 mL Oral BID  . cinacalcet  30 mg Oral Q breakfast  . cloNIDine  0.2 mg Oral BID  . clopidogrel  75 mg Oral QHS  . darbepoetin (ARANESP) injection - DIALYSIS  100 mcg Intravenous Q Mon-HD  . doxercalciferol  5 mcg Intravenous Q M,W,F-HD  . [START ON 11/02/2014] ferric gluconate (FERRLECIT/NULECIT) IV  62.5 mg Intravenous Q Wed-HD  . gabapentin  300 mg Oral QPM  . heparin  5,000 Units Subcutaneous 3 times per day  . insulin aspart protamine- aspart  26 Units Subcutaneous Q supper  . insulin aspart protamine- aspart  28 Units Subcutaneous Q breakfast  . isosorbide mononitrate  120 mg Oral Daily  . labetalol  300 mg Oral BID  . sevelamer carbonate  4,800 mg Oral TID WC  . sodium chloride  3 mL Intravenous Q12H   Continuous Infusions:  PRN Meds:.acetaminophen **OR** acetaminophen, guaiFENesin-dextromethorphan, hydrALAZINE, HYDROcodone-acetaminophen, hydrOXYzine, morphine injection, nitroGLYCERIN   CT angio Chest 10/31/2014: IMPRESSION: No evidence of pulmonary embolism.  LEFT ventricular hypertrophy.  Scattered BILATERAL ground-glass infiltrates and thickening of  interlobular septa which could be due to edema or infection.  Small associated BILATERAL pleural effusions as well as nonspecific RIGHT hilar and mediastinal adenopathy of uncertain significance; consider followup CT imaging in 6 months to reassess thoracic nodes.  Did 5 mm RIGHT upper lobe nodule, recommendation below.  If the patient is at high risk for bronchogenic carcinoma, follow-up chest CT at 6-12 months is recommended. If the patient is at low risk for bronchogenic carcinoma, follow-up chest CT at 12 months is recommended. This recommendation follows the consensus statement: Guidelines for Management of Small Pulmonary Nodules Detected on CT Scans: A Statement from the [place] as published in Radiology 2005;237:395-400.   Electronically Signed   By: Lavonia Dana M.D.   On: 10/31/2014 09:27   Dg Chest Port 1 View 10/30/2014    IMPRESSION: Mild CHF.   Electronically Signed   By: Marijo Sanes M.D.   On: 10/30/2014 19:25  Out patient stress test  LEXiscan myoview stress test 10/28/2014: 1. The resting electrocardiogram demonstrated normal sinus rhythm, IVCD, LVH and secondary ST-T changes. Stress EKG is non-diagnostic for ischemia as it a pharmacologic stress using Lexiscan. Stress symptoms included dyspnea. 2. The T.I.D. is 1.05. Left ventricular cavity is noted to be enlarged on the rest and stress studies. The LV end diastolic volume was 010. SPECT images demonstrate homogeneous tracer distribution throughout the myocardium. The left ventricular ejection fraction was calculated or visually estimated to be 43% with mild global hypokinesis. There was no e/o ischemia or scar. This represents a low risk study. Study may suggest non-ischemic cardiomyopathy. Clinical correlation recommended. No significant change from  09/30/2011. ASSESSMENT AND PLAN:  1.  Shortness of breath and dyspnea on exertion in a patient who has multiple cardiovascular risk factors, renal failure needing dialysis and  also diabetes mellitus. 2.  Abnormal stress test revealing dilated left ventricle, no evidence of ischemia but clinical correlation recommended. 3.  Acute on chronic diastolic heart failure, echocardiogram revealing moderate LVH.  Grade 2 diastolic dysfunction.  Recommendation: Very difficult situation,  Multivessel disease although patient 39 years of age cannot be excluded and the patient with Dilated left ventricle and reduced ejection fraction. Her shortness of breath and dyspnea on exertion could also be related to diastolic dysfunction and renal failure.  Patient is on [facility] list at [facility].  Due to her presentation, proceeding with coronary angiography for definite to delineation of coronary anatomy and progression of coronary artery disease is probably recommended at this point.  I had a long discussion with the patient regarding the risks, benefits and alternatives to coronary angiography, explained to her regarding less than 1% risk of death, stroke, MI, need for urgent CABG limited to these.  Patient would prefer to have coronary angiography.  I'll set her up for angiography in the morning and make further recommendation.  Janet Prows, MD 10/31/2014, 6:22 PM [facility] [place] Pager: [phone] Office: [phone] If no answer Cell [phone]

## 2014-11-01 ENCOUNTER — Encounter (HOSPITAL_COMMUNITY)
Admission: EM | Disposition: A | Discharge status: Home / Self Care | Payer: Self-pay | Source: Home / Self Care | Attending: Internal Medicine

## 2014-11-01 ENCOUNTER — Other Ambulatory Visit (HOSPITAL_COMMUNITY): Payer: Medicare Other

## 2014-11-01 DIAGNOSIS — I10 Essential (primary) hypertension: Secondary | ICD-10-CM

## 2014-11-01 LAB — BASIC METABOLIC PANEL
ANION GAP: 14 (ref 5–15)
BUN: 34 mg/dL — AB (ref 6–20)
CHLORIDE: 98 mmol/L — AB (ref 101–111)
CO2: 25 mmol/L (ref 22–32)
Calcium: 8.7 mg/dL — ABNORMAL LOW (ref 8.9–10.3)
Creatinine, Ser: 7.01 mg/dL — ABNORMAL HIGH (ref 0.44–1.00)
GFR calc Af Amer: 8 mL/min — ABNORMAL LOW (ref >60)
GFR calc non Af Amer: 7 mL/min — ABNORMAL LOW (ref >60)
GLUCOSE: 126 mg/dL — AB (ref 65–99)
POTASSIUM: 4.3 mmol/L (ref 3.5–5.1)
Sodium: 137 mmol/L (ref 135–145)

## 2014-11-01 LAB — CBC
HEMATOCRIT: 26 % — AB (ref 36.0–46.0)
HEMOGLOBIN: 8.4 g/dL — AB (ref 12.0–15.0)
MCH: 31.1 pg (ref 26.0–34.0)
MCHC: 32.3 g/dL (ref 30.0–36.0)
MCV: 96.3 fL (ref 78.0–100.0)
PLATELETS: 154 10*3/uL (ref 150–400)
RBC: 2.7 MIL/uL — AB (ref 3.87–5.11)
RDW: 14 % (ref 11.5–15.5)
WBC: 5.1 10*3/uL (ref 4.0–10.5)

## 2014-11-01 LAB — GLUCOSE, CAPILLARY
GLUCOSE-CAPILLARY: 115 mg/dL — AB (ref 65–99)
GLUCOSE-CAPILLARY: 158 mg/dL — AB (ref 65–99)
Glucose-Capillary: 318 mg/dL — ABNORMAL HIGH (ref 65–99)

## 2014-11-01 LAB — HEMOGLOBIN A1C
Hgb A1c MFr Bld: 9.7 % — ABNORMAL HIGH (ref 4.8–5.6)
Mean Plasma Glucose: 232 mg/dL

## 2014-11-01 SURGERY — LEFT HEART CATH AND CORONARY ANGIOGRAPHY
Anesthesia: LOCAL

## 2014-11-01 MED ORDER — HYDRALAZINE HCL 50 MG PO TABS: 50.0000 mg | ORAL_TABLET | Freq: Three times a day (TID) | ORAL | Status: DC

## 2014-11-01 MED ORDER — LABETALOL HCL 300 MG PO TABS: 300.0000 mg | ORAL_TABLET | Freq: Three times a day (TID) | ORAL | Status: DC

## 2014-11-01 MED ORDER — CLONIDINE HCL 0.3 MG PO TABS: 0.3000 mg | ORAL_TABLET | Freq: Three times a day (TID) | ORAL | Status: DC

## 2014-11-01 MED ORDER — NAPROXEN 500 MG PO TABS: 500.0000 mg | ORAL_TABLET | Freq: Once | ORAL | Status: DC

## 2014-11-01 MED ORDER — RENA-VITE PO TABS
1.0000 | ORAL_TABLET | Freq: Every day | ORAL | Status: DC
  Filled 2014-11-01: qty 1

## 2014-11-01 MED ORDER — CLONIDINE HCL 0.3 MG PO TABS
0.3000 mg | ORAL_TABLET | Freq: Three times a day (TID) | ORAL | Status: DC
  Administered 2014-11-01: 0.3 mg via ORAL
  Filled 2014-11-01 (×2): qty 1

## 2014-11-01 MED ORDER — HYDRALAZINE HCL 50 MG PO TABS
100.0000 mg | ORAL_TABLET | Freq: Three times a day (TID) | ORAL | Status: DC
  Filled 2014-11-01 (×2): qty 2

## 2014-11-01 MED ORDER — LABETALOL HCL 300 MG PO TABS
300.0000 mg | ORAL_TABLET | Freq: Three times a day (TID) | ORAL | Status: DC
  Filled 2014-11-01: qty 1

## 2014-11-01 MED ORDER — CLONIDINE HCL 0.2 MG PO TABS: 0.2000 mg | ORAL_TABLET | Freq: Two times a day (BID) | ORAL | Status: DC

## 2014-11-01 MED ORDER — HYDRALAZINE HCL 25 MG PO TABS: 25.0000 mg | ORAL_TABLET | Freq: Three times a day (TID) | ORAL | Status: DC

## 2014-11-01 MED ORDER — CLONIDINE HCL 0.1 MG PO TABS
0.1000 mg | ORAL_TABLET | Freq: Two times a day (BID) | ORAL | Status: DC
Start: 2014-11-01 — End: 2014-11-01
  Administered 2014-11-01: 0.1 mg via ORAL
  Filled 2014-11-01 (×2): qty 1

## 2014-11-01 MED ORDER — HYDRALAZINE HCL 50 MG PO TABS
50.0000 mg | ORAL_TABLET | Freq: Three times a day (TID) | ORAL | Status: DC
  Filled 2014-11-01 (×2): qty 1

## 2014-11-01 NOTE — Progress Notes (Signed)
OT Cancellation Note  Patient Details Name: [name] MRN: [id number] DOB: Feb 24, 1976   Cancelled Treatment:    Reason Eval/Treat Not Completed: Patient at procedure or test/ unavailable - Pt going for cardiac cath.   Darlina Rumpf Wilson, OTR/L [phone]  11/01/2014, 1:12 PM

## 2014-11-01 NOTE — Telephone Encounter (Signed)
Spoke with husband--pt is currently in hospital.

## 2014-11-01 NOTE — Progress Notes (Signed)
Patient to discharge home instructions given, pt. Verbalized understanding.  Pt. VSS with no s/s of distress noted.  Patient stable at discharge.

## 2014-11-01 NOTE — Progress Notes (Signed)
Subjective: Interval History: has no complaint, feeling bettter, not sure why can get below dry..  Objective: Vital signs in last 24 hours: Temp:  [97.3 F (36.3 C)-98 F (36.7 C)] 97.8 F (36.6 C) (06/07 0414) Pulse Rate:  [68-79] 69 (06/06 1700) Resp:  [14-21] 14 (06/07 0416) BP: (157-191)/(68-90) 159/81 mmHg (06/07 0416) SpO2:  [97 %-98 %] 98 % (06/06 2317) Weight:  [107.6 kg (237 lb 3.4 oz)-111.4 kg (245 lb 9.5 oz)] 107.6 kg (237 lb 3.4 oz) (06/06 1700) Weight change: 2.537 kg (5 lb 9.5 oz)  Intake/Output from previous day: 06/06 0701 - 06/07 0700 In: -  Out: 3855  Intake/Output this shift:    General appearance: alert, cooperative, moderately obese and pale Resp: diminished breath sounds bilaterally Cardio: S1, S2 normal and systolic murmur: holosystolic 2/6, blowing at apex GI: obese, pos bs, liver down 5 cm Extremities: edema tr and avf LLA  Lab Results:  Recent Labs  10/30/14 1833 10/31/14 0405  WBC 6.1 7.0  HGB 7.6* 7.7*  HCT 23.6* 23.7*  PLT 159 145*   BMET:  Recent Labs  10/30/14 1833 10/31/14 0405  NA 136 140  K 4.5 4.3  CL 98* 100*  CO2 25 26  GLUCOSE 505* 117*  BUN 54* 57*  CREATININE 9.51* 9.83*  CALCIUM 8.5* 8.8*   No results for input(s): PTH in the last 72 hours. Iron Studies:  Recent Labs  10/31/14 0405  IRON 124  TIBC 157*  FERRITIN 780*    Studies/Results: Ct Angio Chest Pe W/cm &/or Wo Cm  10/31/2014   CLINICAL DATA:  Dyspnea and shortness of breath, history hypertension, diabetes, former smoker, coronary artery disease, hyperlipidemia  EXAM: CT ANGIOGRAPHY CHEST WITH CONTRAST  TECHNIQUE: Multidetector CT imaging of the chest was performed using the standard protocol during bolus administration of intravenous contrast. Multiplanar CT image reconstructions and MIPs were obtained to evaluate the vascular anatomy.  CONTRAST:  74mL OMNIPAQUE IOHEXOL 350 MG/ML SOLN IV  COMPARISON:  None  FINDINGS: Scattered atherosclerotic  calcification coronary arteries, aorta and proximal great vessels.  Aorta normal caliber without aneurysm or dissection.  Enlarged RIGHT paratracheal 4R node 17 mm short axis image 28.  Upper normal sized level 6 para-aortic/ AP window nodes.  Minimally enlarged RIGHT hilar 10R node 13 mm short axis image 38.  Additional scattered normal and upper normal sized inferior cervical, prevascular, RIGHT paratracheal and subcarinal nodes.  Pulmonary arteries adequately opacified and patent.  No evidence of pulmonary embolism.  Thickening of intraventricular septum and LEFT ventricular myocardium consistent with LEFT ventricular hypertrophy.  Visualized upper abdomen normal appearance.  Small BILATERAL pleural effusions.  Scattered ground-glass infiltrates and thickening of interlobular septa in both lungs.  5 mm RIGHT upper lobe nodule image 32.  No additional mass/ nodule, segmental consolidation, or pneumothorax.  Osseous structures unremarkable.  Review of the MIP images confirms the above findings.  IMPRESSION: No evidence of pulmonary embolism.  LEFT ventricular hypertrophy.  Scattered BILATERAL ground-glass infiltrates and thickening of interlobular septa which could be due to edema or infection.  Small associated BILATERAL pleural effusions as well as nonspecific RIGHT hilar and mediastinal adenopathy of uncertain significance; consider followup CT imaging in 6 months to reassess thoracic nodes.  Did 5 mm RIGHT upper lobe nodule, recommendation below.  If the patient is at high risk for bronchogenic carcinoma, follow-up chest CT at 6-12 months is recommended. If the patient is at low risk for bronchogenic carcinoma, follow-up chest CT at 12 months is  recommended. This recommendation follows the consensus statement: Guidelines for Management of Small Pulmonary Nodules Detected on CT Scans: A Statement from the [place] as published in Radiology 2005;237:395-400.   Electronically Signed   By: Lavonia Dana  M.D.   On: 10/31/2014 09:27   Dg Chest Port 1 View  10/30/2014   CLINICAL DATA:  Shortness of breath and chest pain for 1 week. Symptoms worsening today.  EXAM: PORTABLE CHEST - 1 VIEW  COMPARISON:  10/29/2014  FINDINGS: The heart is mildly enlarged. There is vascular congestion and interstitial pulmonary edema. No pleural effusions. The bony thorax is intact.  IMPRESSION: Mild CHF.   Electronically Signed   By: Marijo Sanes M.D.   On: 10/30/2014 19:25    I have reviewed the patient's current medications. Prior to Admission:  Prescriptions prior to admission  Medication Sig Dispense Refill Last Dose  . amLODipine (NORVASC) 10 MG tablet Take 1 tablet (10 mg total) by mouth daily. 30 tablet 0 10/29/2014 at Unknown time  . atorvastatin (LIPITOR) 80 MG tablet Take 1 tablet (80 mg total) by mouth every evening. 30 tablet 3 10/29/2014 at Unknown time  . chlorpheniramine-HYDROcodone (TUSSIONEX PENNKINETIC ER) 10-8 MG/5ML SUER Take 5 mLs by mouth 2 (two) times daily. 60 mL 0 10/29/2014 at Unknown time  . cinacalcet (SENSIPAR) 30 MG tablet Take 30 mg by mouth daily.   10/30/2014 at Unknown time  . cloNIDine (CATAPRES) 0.1 MG tablet Take 0.1 mg by mouth 2 (two) times daily.   10/30/2014 at Unknown time  . clopidogrel (PLAVIX) 75 MG tablet Take 75 mg by mouth daily with breakfast.   10/29/2014 at Unknown time  . gabapentin (NEURONTIN) 300 MG capsule Take 300 mg by mouth every evening.    10/29/2014 at Unknown time  . HUMALOG MIX 50/50 KWIKPEN (50-50) 100 UNIT/ML Kwikpen Inject 26-28 Units into the skin 2 (two) times daily. 28 in the morning and 26 in the evening.   10/30/2014 at Unknown time  . HYDROcodone-acetaminophen (NORCO) 5-325 MG per tablet Take 1 tablet by mouth every 6 (six) hours as needed for moderate pain or severe pain. 15 tablet 0 Past Week at Unknown time  . isosorbide mononitrate (IMDUR) 120 MG 24 hr tablet Take 120 mg by mouth daily.   10/30/2014 at Unknown time  . labetalol (NORMODYNE) 300 MG tablet Take 1  tablet (300 mg total) by mouth 3 (three) times daily. (Patient taking differently: Take 300 mg by mouth 2 (two) times daily. ) 270 tablet 3 10/30/2014 at Unknown time  . levofloxacin (LEVAQUIN) 500 MG tablet Take 1 tablet (500 mg total) by mouth daily. (Patient taking differently: Take 500 mg by mouth daily. Started medication on 10-29-14) 10 tablet 0 10/30/2014 at Unknown time  . nitroGLYCERIN (NITROSTAT) 0.4 MG SL tablet Place 0.4 mg under the tongue every 5 (five) minutes as needed. For chest pain   unknown at unknown  . ondansetron (ZOFRAN) 4 MG tablet Take 1 tablet (4 mg total) by mouth every 8 (eight) hours as needed for nausea or vomiting. 15 tablet 0 Past Week at Unknown time  . RENVELA 800 MG tablet Take 1 tablet by mouth 4 (four) times daily - after meals and at bedtime.   10/30/2014 at Unknown time    Assessment/Plan: 1 ESRD vol xs , needs full tx.  Recurrent s/o.  Still needs lower vol and solute. 2 HA better with Osm changes, and Pg inhibitor 3 HTN lower vol 4 Dilated vent/dia dysfct relates  mostly to vol and bp and her adherence 5 DM controlled 6 ? CAD 7 anemia ^ epo P HD, epo, DM control , lower vol, ^ epo   LOS: 2 days   [name] L 11/01/2014,7:45 AM

## 2014-11-01 NOTE — Progress Notes (Signed)
PT Cancellation Note  Patient Details Name: TERIANA DANKER MRN: [id number] DOB: 10/21/1975   Cancelled Treatment:    Reason Eval/Treat Not Completed: Medical issues which prohibited therapy;Other (comment) (pt going imminently to CATH then will be on bedrest.) 11/01/2014  Donnella Sham, PT [phone]  (pager)  Shilah Hefel, Tessie Fass 11/01/2014, 1:03 PM

## 2014-11-01 NOTE — Progress Notes (Signed)
UR COMPLETED  

## 2014-11-01 NOTE — Discharge Summary (Signed)
Physician Discharge Summary  [name] IRC:[id number] DOB: Sep 30, 1975 DOA: 10/30/2014  PCP: Merrilee Seashore, MD  Admit date: 10/30/2014 Discharge date: 11/01/2014  Time spent: 50 minutes  Recommendations for Outpatient Follow-up:  1. Repeat CT in 6-12 months for RUL nodule  Discharge Condition: stable Diet recommendation: low sodium heart healthy renal diet  Discharge Diagnoses:  Principal Problem:   Shortness of breath Active Problems:   Essential hypertension- uncontrolled   End stage renal disease on dialysis   Hyperlipidemia   CIN III (cervical intraepithelial neoplasia grade III) with severe dysplasia     History of present illness:  [name] is a 39 y.o. female with h/o HTN, ESRD on HD, DM type 1, PVD, Gastroparesis, peripheral neuropathy admitted for shortness of breath on laying flat and on exertion. Symptoms have been ongoing for 1 wk- no c/o cough, chest pain, fevers, palpitations. Found to have mildly elevated troponin in ER. Underwent a stress test at Dr Irven Shelling office on Friday. This was low risk but LV was dilated and EF was about 40 %.   Hospital Course:  Principal Problem:  Shortness of breath/ elevated troponin/ T wave inversion in V4-V6 -dyspnea on exertion and on laying flat suggestive of pulm edema  - CT chest - reveals ground glass infiltrates and non-specific right hilar adenopathy and LV hypertrophy -  symptoms have not improved with dialysis - Myoview stress test last week at Dr Irven Shelling was normal-  - ECHO reveals LVH  And grade 2 diastolic dysfunction- suspect uncontrolled BP and diastolic dysfunction are causing her dyspnea - Cardiolgoy consult requested- Dr Larina Earthly has evaluated the patient as well and his thoughts are the same - Have increased Clonidine and Labetalol and added Hydralazine- Dr Einar Gip will follow as outpt- if symptoms do not improve with control of BP, he will decide on whether she needs a cardiac cath.   Active  Problems: RUL nodule - 5 mm- repeat CT in 6-12 months   Essential hypertension, benign - BP quite elevated- see above in regards to medication changes   End stage renal disease on dialysis - undergoing dialysis at this time  Anemia- likely due to CKD - management per renal team - Hb 8.5 today  DM type 1 - cont 70/30 insulin    Procedures:  2 D ECHO Study Conclusions  - Left ventricle: There was moderate concentric hypertrophy. Systolic function was normal. The estimated ejection fraction was in the range of 55% to 60%. Features are consistent with a pseudonormal left ventricular filling pattern, with concomitant abnormal relaxation and increased filling pressure (grade 2 diastolic dysfunction). Doppler parameters are consistent with both elevated ventricular end-diastolic filling pressure and elevated left atrial filling pressure. - Mitral valve: Calcified annulus. - Left atrium: The atrium was severely dilated. - Right atrium: The atrium was mildly dilated. - Atrial septum: septal drop out without e/o ASD or PFO by color Doppler. Bulges to left suggests elevated right heart pressure. No defect or patent foramen ovale was identified. - Pulmonary arteries: PA peak pressure: 48 mm Hg (S). - Inferior vena cava: The vessel was normal in size. The respirophasic diameter changes was blunted < 50%, consistent with elevated central venous pressure  Consultations:  Nephrology  Cardiology   Discharge Exam: Filed Weights   10/31/14 0500 10/31/14 1340 10/31/14 1700  Weight: 110.6 kg (243 lb 13.3 oz) 111.4 kg (245 lb 9.5 oz) 107.6 kg (237 lb 3.4 oz)   Filed Vitals:   11/01/14 1141  BP: 168/77  Pulse:   Temp:   Resp: 16    General: AAO x 3, no distress Cardiovascular: RRR, no murmurs  Respiratory: clear to auscultation bilaterally GI: soft, non-tender, non-distended, bowel sound positive  Discharge Instructions You were cared for by a  hospitalist during your hospital stay. If you have any questions about your discharge medications or the care you received while you were in the hospital after you are discharged, you can call the unit and asked to speak with the hospitalist on call if the hospitalist that took care of you is not available. Once you are discharged, your primary care physician will handle any further medical issues. Please note that NO REFILLS for any discharge medications will be authorized once you are discharged, as it is imperative that you return to your primary care physician (or establish a relationship with a primary care physician if you do not have one) for your aftercare needs so that they can reassess your need for medications and monitor your lab values.  Discharge Instructions    Discharge instructions    Complete by:  As directed   Renal diet, low sodium heart healthy     Increase activity slowly    Complete by:  As directed             Medication List    STOP taking these medications        levofloxacin 500 MG tablet  Commonly known as:  LEVAQUIN      TAKE these medications        amLODipine 10 MG tablet  Commonly known as:  NORVASC  Take 1 tablet (10 mg total) by mouth daily.     atorvastatin 80 MG tablet  Commonly known as:  LIPITOR  Take 1 tablet (80 mg total) by mouth every evening.     chlorpheniramine-HYDROcodone 10-8 MG/5ML Suer  Commonly known as:  TUSSIONEX PENNKINETIC ER  Take 5 mLs by mouth 2 (two) times daily.     cinacalcet 30 MG tablet  Commonly known as:  SENSIPAR  Take 30 mg by mouth daily.     cloNIDine 0.3 MG tablet  Commonly known as:  CATAPRES  Take 1 tablet (0.3 mg total) by mouth 3 (three) times daily.     clopidogrel 75 MG tablet  Commonly known as:  PLAVIX  Take 75 mg by mouth daily with breakfast.     gabapentin 300 MG capsule  Commonly known as:  NEURONTIN  Take 300 mg by mouth every evening.     HUMALOG MIX 50/50 KWIKPEN (50-50) 100 UNIT/ML  Kwikpen  Generic drug:  Insulin Lispro Prot & Lispro  Inject 26-28 Units into the skin 2 (two) times daily. 28 in the morning and 26 in the evening.     hydrALAZINE 50 MG tablet  Commonly known as:  APRESOLINE  Take 1 tablet (50 mg total) by mouth every 8 (eight) hours.     HYDROcodone-acetaminophen 5-325 MG per tablet  Commonly known as:  NORCO  Take 1 tablet by mouth every 6 (six) hours as needed for moderate pain or severe pain.     isosorbide mononitrate 120 MG 24 hr tablet  Commonly known as:  IMDUR  Take 120 mg by mouth daily.     labetalol 300 MG tablet  Commonly known as:  NORMODYNE  Take 1 tablet (300 mg total) by mouth 3 (three) times daily.     nitroGLYCERIN 0.4 MG SL tablet  Commonly known as:  NITROSTAT  Place 0.4  mg under the tongue every 5 (five) minutes as needed. For chest pain     ondansetron 4 MG tablet  Commonly known as:  ZOFRAN  Take 1 tablet (4 mg total) by mouth every 8 (eight) hours as needed for nausea or vomiting.     RENVELA 800 MG tablet  Generic drug:  sevelamer carbonate  Take 1 tablet by mouth 4 (four) times daily - after meals and at bedtime.       Allergies  Allergen Reactions  . Cephalexin Other (See Comments)    Reaction unknown  . Sulfamethoxazole-Trimethoprim Other (See Comments)    Thrush      The results of significant diagnostics from this hospitalization (including imaging, microbiology, ancillary and laboratory) are listed below for reference.    Significant Diagnostic Studies: Dg Chest 2 View  10/29/2014   CLINICAL DATA:  Cough and shortness of breath.  EXAM: CHEST  2 VIEW  COMPARISON:  None.  FINDINGS: Heart size and pulmonary vascularity are normal. Slight peribronchial thickening on the left. Lungs are otherwise clear. No effusions. No osseous abnormality.  IMPRESSION: Slight bronchitic changes.   Electronically Signed   By: Lorriane Shire M.D.   On: 10/29/2014 15:52   Ct Angio Chest Pe W/cm &/or Wo Cm  10/31/2014    CLINICAL DATA:  Dyspnea and shortness of breath, history hypertension, diabetes, former smoker, coronary artery disease, hyperlipidemia  EXAM: CT ANGIOGRAPHY CHEST WITH CONTRAST  TECHNIQUE: Multidetector CT imaging of the chest was performed using the standard protocol during bolus administration of intravenous contrast. Multiplanar CT image reconstructions and MIPs were obtained to evaluate the vascular anatomy.  CONTRAST:  70mL OMNIPAQUE IOHEXOL 350 MG/ML SOLN IV  COMPARISON:  None  FINDINGS: Scattered atherosclerotic calcification coronary arteries, aorta and proximal great vessels.  Aorta normal caliber without aneurysm or dissection.  Enlarged RIGHT paratracheal 4R node 17 mm short axis image 28.  Upper normal sized level 6 para-aortic/ AP window nodes.  Minimally enlarged RIGHT hilar 10R node 13 mm short axis image 38.  Additional scattered normal and upper normal sized inferior cervical, prevascular, RIGHT paratracheal and subcarinal nodes.  Pulmonary arteries adequately opacified and patent.  No evidence of pulmonary embolism.  Thickening of intraventricular septum and LEFT ventricular myocardium consistent with LEFT ventricular hypertrophy.  Visualized upper abdomen normal appearance.  Small BILATERAL pleural effusions.  Scattered ground-glass infiltrates and thickening of interlobular septa in both lungs.  5 mm RIGHT upper lobe nodule image 32.  No additional mass/ nodule, segmental consolidation, or pneumothorax.  Osseous structures unremarkable.  Review of the MIP images confirms the above findings.  IMPRESSION: No evidence of pulmonary embolism.  LEFT ventricular hypertrophy.  Scattered BILATERAL ground-glass infiltrates and thickening of interlobular septa which could be due to edema or infection.  Small associated BILATERAL pleural effusions as well as nonspecific RIGHT hilar and mediastinal adenopathy of uncertain significance; consider followup CT imaging in 6 months to reassess thoracic nodes.  Did  5 mm RIGHT upper lobe nodule, recommendation below.  If the patient is at high risk for bronchogenic carcinoma, follow-up chest CT at 6-12 months is recommended. If the patient is at low risk for bronchogenic carcinoma, follow-up chest CT at 12 months is recommended. This recommendation follows the consensus statement: Guidelines for Management of Small Pulmonary Nodules Detected on CT Scans: A Statement from the [employer] as published in Radiology 2005;237:395-400.   Electronically Signed   By: Lavonia Dana M.D.   On: 10/31/2014 09:27   Dg  Chest Port 1 View  10/30/2014   CLINICAL DATA:  Shortness of breath and chest pain for 1 week. Symptoms worsening today.  EXAM: PORTABLE CHEST - 1 VIEW  COMPARISON:  10/29/2014  FINDINGS: The heart is mildly enlarged. There is vascular congestion and interstitial pulmonary edema. No pleural effusions. The bony thorax is intact.  IMPRESSION: Mild CHF.   Electronically Signed   By: Marijo Sanes M.D.   On: 10/30/2014 19:25    Microbiology: Recent Results (from the past 240 hour(s))  MRSA PCR Screening     Status: None   Collection Time: 10/31/14  2:22 AM  Result Value Ref Range Status   MRSA by PCR NEGATIVE NEGATIVE Final    Comment:        The GeneXpert MRSA Assay (FDA approved for NASAL specimens only), is one component of a comprehensive MRSA colonization surveillance program. It is not intended to diagnose MRSA infection nor to guide or monitor treatment for MRSA infections.      Labs: Basic Metabolic Panel:  Recent Labs Lab 10/30/14 1833 10/31/14 0405 11/01/14 0926  NA 136 140 137  K 4.5 4.3 4.3  CL 98* 100* 98*  CO2 25 26 25   GLUCOSE 505* 117* 126*  BUN 54* 57* 34*  CREATININE 9.51* 9.83* 7.01*  CALCIUM 8.5* 8.8* 8.7*   Liver Function Tests:  Recent Labs Lab 10/30/14 1833  AST 13*  ALT 17  ALKPHOS 56  BILITOT 0.5  PROT 6.5  ALBUMIN 3.1*   No results for input(s): LIPASE, AMYLASE in the last 168 hours. No results  for input(s): AMMONIA in the last 168 hours. CBC:  Recent Labs Lab 10/30/14 1833 10/31/14 0405 11/01/14 0926  WBC 6.1 7.0 5.1  HGB 7.6* 7.7* 8.4*  HCT 23.6* 23.7* 26.0*  MCV 97.1 95.6 96.3  PLT 159 145* 154   Cardiac Enzymes:  Recent Labs Lab 10/30/14 2216 10/31/14 0405 10/31/14 1050  TROPONINI 0.08* 0.09* 0.06*   BNP: BNP (last 3 results)  Recent Labs  10/30/14 1932  BNP 2024.0*    ProBNP (last 3 results) No results for input(s): PROBNP in the last 8760 hours.  CBG:  Recent Labs Lab 10/31/14 1157 10/31/14 1808 10/31/14 2120 11/01/14 0801 11/01/14 1139  GLUCAP 273* 113* 115* 115* 158*       SignedDebbe Odea, MD [facility] 11/01/2014, 4:43 PM

## 2014-11-01 NOTE — Care Management Note (Signed)
Case Management Note  Patient Details  Name: [name] MRN: [id number] Date of Birth: [birth year]  Subjective/Objective:                   Pt from home admitted with shortness of breath and chest tightness.   Action/Plan: Return to home when medically stable. CM to f/u with discharge disposition.  Expected Discharge Date:                  Expected Discharge Plan:  Home/Self Care  In-House Referral:     Discharge planning Services  CM Consult  Post Acute Care Choice:    Choice offered to:     DME Arranged:    DME Agency:     HH Arranged:    HH Agency:     Status of Service:  In process, will continue to follow  Medicare Important Message Given:    Date Medicare IM Given:    Medicare IM give by:    Date Additional Medicare IM Given:    Additional Medicare Important Message give by:     If discussed at [place] of Stay Meetings, dates discussed:    Additional Comments: Rosezella Kronick (spouse) [phone] Sharin Mons, RN 11/01/2014, 1:46 PM

## 2014-11-02 DIAGNOSIS — D509 Iron deficiency anemia, unspecified: Secondary | ICD-10-CM | POA: Diagnosis not present

## 2014-11-02 DIAGNOSIS — D508 Other iron deficiency anemias: Secondary | ICD-10-CM | POA: Diagnosis not present

## 2014-11-02 DIAGNOSIS — N186 End stage renal disease: Secondary | ICD-10-CM | POA: Diagnosis not present

## 2014-11-02 DIAGNOSIS — E1129 Type 2 diabetes mellitus with other diabetic kidney complication: Secondary | ICD-10-CM | POA: Diagnosis not present

## 2014-11-02 DIAGNOSIS — N2581 Secondary hyperparathyroidism of renal origin: Secondary | ICD-10-CM | POA: Diagnosis not present

## 2014-11-02 LAB — TYPE AND SCREEN
ABO/RH(D): A POS
Antibody Screen: NEGATIVE
Unit division: 0
Unit division: 0

## 2014-11-02 LAB — HEPATITIS B SURFACE ANTIGEN: Hepatitis B Surface Ag: NEGATIVE

## 2014-11-04 DIAGNOSIS — N186 End stage renal disease: Secondary | ICD-10-CM | POA: Diagnosis not present

## 2014-11-04 DIAGNOSIS — D509 Iron deficiency anemia, unspecified: Secondary | ICD-10-CM | POA: Diagnosis not present

## 2014-11-04 DIAGNOSIS — E1129 Type 2 diabetes mellitus with other diabetic kidney complication: Secondary | ICD-10-CM | POA: Diagnosis not present

## 2014-11-04 DIAGNOSIS — D508 Other iron deficiency anemias: Secondary | ICD-10-CM | POA: Diagnosis not present

## 2014-11-04 DIAGNOSIS — N2581 Secondary hyperparathyroidism of renal origin: Secondary | ICD-10-CM | POA: Diagnosis not present

## 2014-11-07 DIAGNOSIS — E1129 Type 2 diabetes mellitus with other diabetic kidney complication: Secondary | ICD-10-CM | POA: Diagnosis not present

## 2014-11-07 DIAGNOSIS — D508 Other iron deficiency anemias: Secondary | ICD-10-CM | POA: Diagnosis not present

## 2014-11-07 DIAGNOSIS — N2581 Secondary hyperparathyroidism of renal origin: Secondary | ICD-10-CM | POA: Diagnosis not present

## 2014-11-07 DIAGNOSIS — D509 Iron deficiency anemia, unspecified: Secondary | ICD-10-CM | POA: Diagnosis not present

## 2014-11-07 DIAGNOSIS — N186 End stage renal disease: Secondary | ICD-10-CM | POA: Diagnosis not present

## 2014-11-08 DIAGNOSIS — I5032 Chronic diastolic (congestive) heart failure: Secondary | ICD-10-CM | POA: Diagnosis not present

## 2014-11-08 DIAGNOSIS — Q253 Supravalvular aortic stenosis: Secondary | ICD-10-CM | POA: Diagnosis not present

## 2014-11-08 DIAGNOSIS — I739 Peripheral vascular disease, unspecified: Secondary | ICD-10-CM | POA: Diagnosis not present

## 2014-11-08 DIAGNOSIS — I2510 Atherosclerotic heart disease of native coronary artery without angina pectoris: Secondary | ICD-10-CM | POA: Diagnosis not present

## 2014-11-09 DIAGNOSIS — D508 Other iron deficiency anemias: Secondary | ICD-10-CM | POA: Diagnosis not present

## 2014-11-09 DIAGNOSIS — D509 Iron deficiency anemia, unspecified: Secondary | ICD-10-CM | POA: Diagnosis not present

## 2014-11-09 DIAGNOSIS — N2581 Secondary hyperparathyroidism of renal origin: Secondary | ICD-10-CM | POA: Diagnosis not present

## 2014-11-09 DIAGNOSIS — N186 End stage renal disease: Secondary | ICD-10-CM | POA: Diagnosis not present

## 2014-11-09 DIAGNOSIS — E1129 Type 2 diabetes mellitus with other diabetic kidney complication: Secondary | ICD-10-CM | POA: Diagnosis not present

## 2014-11-10 DIAGNOSIS — E11359 Type 2 diabetes mellitus with proliferative diabetic retinopathy without macular edema: Secondary | ICD-10-CM | POA: Diagnosis not present

## 2014-11-10 DIAGNOSIS — H35431 Paving stone degeneration of retina, right eye: Secondary | ICD-10-CM | POA: Diagnosis not present

## 2014-11-10 DIAGNOSIS — I739 Peripheral vascular disease, unspecified: Secondary | ICD-10-CM | POA: Diagnosis not present

## 2014-11-10 DIAGNOSIS — Z87891 Personal history of nicotine dependence: Secondary | ICD-10-CM | POA: Diagnosis not present

## 2014-11-10 DIAGNOSIS — Z7902 Long term (current) use of antithrombotics/antiplatelets: Secondary | ICD-10-CM | POA: Diagnosis not present

## 2014-11-10 DIAGNOSIS — H2513 Age-related nuclear cataract, bilateral: Secondary | ICD-10-CM | POA: Diagnosis not present

## 2014-11-10 DIAGNOSIS — H35371 Puckering of macula, right eye: Secondary | ICD-10-CM | POA: Diagnosis not present

## 2014-11-10 DIAGNOSIS — H53431 Sector or arcuate defects, right eye: Secondary | ICD-10-CM | POA: Diagnosis not present

## 2014-11-10 DIAGNOSIS — H4601 Optic papillitis, right eye: Secondary | ICD-10-CM | POA: Diagnosis not present

## 2014-11-10 DIAGNOSIS — Z794 Long term (current) use of insulin: Secondary | ICD-10-CM | POA: Diagnosis not present

## 2014-11-11 ENCOUNTER — Other Ambulatory Visit: Payer: Self-pay | Admitting: Nephrology

## 2014-11-11 ENCOUNTER — Ambulatory Visit
Admission: RE | Admit: 2014-11-11 | Discharge: 2014-11-11 | Disposition: A | Discharge status: Home / Self Care | Payer: Medicare Other | Source: Ambulatory Visit | Attending: Nephrology | Admitting: Nephrology

## 2014-11-11 DIAGNOSIS — I517 Cardiomegaly: Secondary | ICD-10-CM | POA: Diagnosis not present

## 2014-11-11 DIAGNOSIS — R918 Other nonspecific abnormal finding of lung field: Secondary | ICD-10-CM | POA: Diagnosis not present

## 2014-11-11 DIAGNOSIS — E1129 Type 2 diabetes mellitus with other diabetic kidney complication: Secondary | ICD-10-CM | POA: Diagnosis not present

## 2014-11-11 DIAGNOSIS — N186 End stage renal disease: Secondary | ICD-10-CM | POA: Diagnosis not present

## 2014-11-11 DIAGNOSIS — D509 Iron deficiency anemia, unspecified: Secondary | ICD-10-CM | POA: Diagnosis not present

## 2014-11-11 DIAGNOSIS — R0602 Shortness of breath: Secondary | ICD-10-CM

## 2014-11-11 DIAGNOSIS — N2581 Secondary hyperparathyroidism of renal origin: Secondary | ICD-10-CM | POA: Diagnosis not present

## 2014-11-11 DIAGNOSIS — D508 Other iron deficiency anemias: Secondary | ICD-10-CM | POA: Diagnosis not present

## 2014-11-14 DIAGNOSIS — D508 Other iron deficiency anemias: Secondary | ICD-10-CM | POA: Diagnosis not present

## 2014-11-14 DIAGNOSIS — D509 Iron deficiency anemia, unspecified: Secondary | ICD-10-CM | POA: Diagnosis not present

## 2014-11-14 DIAGNOSIS — N186 End stage renal disease: Secondary | ICD-10-CM | POA: Diagnosis not present

## 2014-11-14 DIAGNOSIS — N2581 Secondary hyperparathyroidism of renal origin: Secondary | ICD-10-CM | POA: Diagnosis not present

## 2014-11-14 DIAGNOSIS — E1129 Type 2 diabetes mellitus with other diabetic kidney complication: Secondary | ICD-10-CM | POA: Diagnosis not present

## 2014-11-15 DIAGNOSIS — R0989 Other specified symptoms and signs involving the circulatory and respiratory systems: Secondary | ICD-10-CM | POA: Diagnosis not present

## 2014-11-16 DIAGNOSIS — E1129 Type 2 diabetes mellitus with other diabetic kidney complication: Secondary | ICD-10-CM | POA: Diagnosis not present

## 2014-11-16 DIAGNOSIS — D509 Iron deficiency anemia, unspecified: Secondary | ICD-10-CM | POA: Diagnosis not present

## 2014-11-16 DIAGNOSIS — D508 Other iron deficiency anemias: Secondary | ICD-10-CM | POA: Diagnosis not present

## 2014-11-16 DIAGNOSIS — N186 End stage renal disease: Secondary | ICD-10-CM | POA: Diagnosis not present

## 2014-11-16 DIAGNOSIS — N2581 Secondary hyperparathyroidism of renal origin: Secondary | ICD-10-CM | POA: Diagnosis not present

## 2014-11-18 DIAGNOSIS — D508 Other iron deficiency anemias: Secondary | ICD-10-CM | POA: Diagnosis not present

## 2014-11-18 DIAGNOSIS — N2581 Secondary hyperparathyroidism of renal origin: Secondary | ICD-10-CM | POA: Diagnosis not present

## 2014-11-18 DIAGNOSIS — N186 End stage renal disease: Secondary | ICD-10-CM | POA: Diagnosis not present

## 2014-11-18 DIAGNOSIS — D509 Iron deficiency anemia, unspecified: Secondary | ICD-10-CM | POA: Diagnosis not present

## 2014-11-18 DIAGNOSIS — E1129 Type 2 diabetes mellitus with other diabetic kidney complication: Secondary | ICD-10-CM | POA: Diagnosis not present

## 2014-11-21 DIAGNOSIS — N186 End stage renal disease: Secondary | ICD-10-CM | POA: Diagnosis not present

## 2014-11-21 DIAGNOSIS — D508 Other iron deficiency anemias: Secondary | ICD-10-CM | POA: Diagnosis not present

## 2014-11-21 DIAGNOSIS — E1129 Type 2 diabetes mellitus with other diabetic kidney complication: Secondary | ICD-10-CM | POA: Diagnosis not present

## 2014-11-21 DIAGNOSIS — D509 Iron deficiency anemia, unspecified: Secondary | ICD-10-CM | POA: Diagnosis not present

## 2014-11-21 DIAGNOSIS — N2581 Secondary hyperparathyroidism of renal origin: Secondary | ICD-10-CM | POA: Diagnosis not present

## 2014-11-23 DIAGNOSIS — E1129 Type 2 diabetes mellitus with other diabetic kidney complication: Secondary | ICD-10-CM | POA: Diagnosis not present

## 2014-11-23 DIAGNOSIS — N186 End stage renal disease: Secondary | ICD-10-CM | POA: Diagnosis not present

## 2014-11-23 DIAGNOSIS — D509 Iron deficiency anemia, unspecified: Secondary | ICD-10-CM | POA: Diagnosis not present

## 2014-11-23 DIAGNOSIS — N2581 Secondary hyperparathyroidism of renal origin: Secondary | ICD-10-CM | POA: Diagnosis not present

## 2014-11-23 DIAGNOSIS — D508 Other iron deficiency anemias: Secondary | ICD-10-CM | POA: Diagnosis not present

## 2014-11-24 DIAGNOSIS — N186 End stage renal disease: Secondary | ICD-10-CM | POA: Diagnosis not present

## 2014-11-24 DIAGNOSIS — Z992 Dependence on renal dialysis: Secondary | ICD-10-CM | POA: Diagnosis not present

## 2014-11-24 DIAGNOSIS — S92351G Displaced fracture of fifth metatarsal bone, right foot, subsequent encounter for fracture with delayed healing: Secondary | ICD-10-CM | POA: Diagnosis not present

## 2014-11-24 DIAGNOSIS — E1129 Type 2 diabetes mellitus with other diabetic kidney complication: Secondary | ICD-10-CM | POA: Diagnosis not present

## 2014-11-24 DIAGNOSIS — M6701 Short Achilles tendon (acquired), right ankle: Secondary | ICD-10-CM | POA: Diagnosis not present

## 2014-11-25 DIAGNOSIS — E1129 Type 2 diabetes mellitus with other diabetic kidney complication: Secondary | ICD-10-CM | POA: Diagnosis not present

## 2014-11-25 DIAGNOSIS — D508 Other iron deficiency anemias: Secondary | ICD-10-CM | POA: Diagnosis not present

## 2014-11-25 DIAGNOSIS — N186 End stage renal disease: Secondary | ICD-10-CM | POA: Diagnosis not present

## 2014-11-25 DIAGNOSIS — D509 Iron deficiency anemia, unspecified: Secondary | ICD-10-CM | POA: Diagnosis not present

## 2014-11-25 DIAGNOSIS — D631 Anemia in chronic kidney disease: Secondary | ICD-10-CM | POA: Diagnosis not present

## 2014-11-25 DIAGNOSIS — N2581 Secondary hyperparathyroidism of renal origin: Secondary | ICD-10-CM | POA: Diagnosis not present

## 2014-11-28 DIAGNOSIS — N186 End stage renal disease: Secondary | ICD-10-CM | POA: Diagnosis not present

## 2014-11-28 DIAGNOSIS — D509 Iron deficiency anemia, unspecified: Secondary | ICD-10-CM | POA: Diagnosis not present

## 2014-11-28 DIAGNOSIS — D508 Other iron deficiency anemias: Secondary | ICD-10-CM | POA: Diagnosis not present

## 2014-11-28 DIAGNOSIS — D631 Anemia in chronic kidney disease: Secondary | ICD-10-CM | POA: Diagnosis not present

## 2014-11-28 DIAGNOSIS — N2581 Secondary hyperparathyroidism of renal origin: Secondary | ICD-10-CM | POA: Diagnosis not present

## 2014-11-28 DIAGNOSIS — E1129 Type 2 diabetes mellitus with other diabetic kidney complication: Secondary | ICD-10-CM | POA: Diagnosis not present

## 2014-11-30 DIAGNOSIS — D631 Anemia in chronic kidney disease: Secondary | ICD-10-CM | POA: Diagnosis not present

## 2014-11-30 DIAGNOSIS — D508 Other iron deficiency anemias: Secondary | ICD-10-CM | POA: Diagnosis not present

## 2014-11-30 DIAGNOSIS — N186 End stage renal disease: Secondary | ICD-10-CM | POA: Diagnosis not present

## 2014-11-30 DIAGNOSIS — E1129 Type 2 diabetes mellitus with other diabetic kidney complication: Secondary | ICD-10-CM | POA: Diagnosis not present

## 2014-11-30 DIAGNOSIS — D509 Iron deficiency anemia, unspecified: Secondary | ICD-10-CM | POA: Diagnosis not present

## 2014-11-30 DIAGNOSIS — N2581 Secondary hyperparathyroidism of renal origin: Secondary | ICD-10-CM | POA: Diagnosis not present

## 2014-12-02 DIAGNOSIS — D631 Anemia in chronic kidney disease: Secondary | ICD-10-CM | POA: Diagnosis not present

## 2014-12-02 DIAGNOSIS — N186 End stage renal disease: Secondary | ICD-10-CM | POA: Diagnosis not present

## 2014-12-02 DIAGNOSIS — D508 Other iron deficiency anemias: Secondary | ICD-10-CM | POA: Diagnosis not present

## 2014-12-02 DIAGNOSIS — N2581 Secondary hyperparathyroidism of renal origin: Secondary | ICD-10-CM | POA: Diagnosis not present

## 2014-12-02 DIAGNOSIS — E1129 Type 2 diabetes mellitus with other diabetic kidney complication: Secondary | ICD-10-CM | POA: Diagnosis not present

## 2014-12-02 DIAGNOSIS — D509 Iron deficiency anemia, unspecified: Secondary | ICD-10-CM | POA: Diagnosis not present

## 2014-12-05 DIAGNOSIS — D631 Anemia in chronic kidney disease: Secondary | ICD-10-CM | POA: Diagnosis not present

## 2014-12-05 DIAGNOSIS — E1129 Type 2 diabetes mellitus with other diabetic kidney complication: Secondary | ICD-10-CM | POA: Diagnosis not present

## 2014-12-05 DIAGNOSIS — D508 Other iron deficiency anemias: Secondary | ICD-10-CM | POA: Diagnosis not present

## 2014-12-05 DIAGNOSIS — D509 Iron deficiency anemia, unspecified: Secondary | ICD-10-CM | POA: Diagnosis not present

## 2014-12-05 DIAGNOSIS — N2581 Secondary hyperparathyroidism of renal origin: Secondary | ICD-10-CM | POA: Diagnosis not present

## 2014-12-05 DIAGNOSIS — N186 End stage renal disease: Secondary | ICD-10-CM | POA: Diagnosis not present

## 2014-12-06 DIAGNOSIS — Z992 Dependence on renal dialysis: Secondary | ICD-10-CM | POA: Diagnosis not present

## 2014-12-06 DIAGNOSIS — N186 End stage renal disease: Secondary | ICD-10-CM | POA: Diagnosis not present

## 2014-12-06 DIAGNOSIS — D509 Iron deficiency anemia, unspecified: Secondary | ICD-10-CM | POA: Diagnosis not present

## 2014-12-06 DIAGNOSIS — N2581 Secondary hyperparathyroidism of renal origin: Secondary | ICD-10-CM | POA: Diagnosis not present

## 2014-12-09 DIAGNOSIS — D631 Anemia in chronic kidney disease: Secondary | ICD-10-CM | POA: Diagnosis not present

## 2014-12-09 DIAGNOSIS — N186 End stage renal disease: Secondary | ICD-10-CM | POA: Diagnosis not present

## 2014-12-09 DIAGNOSIS — D509 Iron deficiency anemia, unspecified: Secondary | ICD-10-CM | POA: Diagnosis not present

## 2014-12-09 DIAGNOSIS — N2581 Secondary hyperparathyroidism of renal origin: Secondary | ICD-10-CM | POA: Diagnosis not present

## 2014-12-09 DIAGNOSIS — D508 Other iron deficiency anemias: Secondary | ICD-10-CM | POA: Diagnosis not present

## 2014-12-09 DIAGNOSIS — E1129 Type 2 diabetes mellitus with other diabetic kidney complication: Secondary | ICD-10-CM | POA: Diagnosis not present

## 2014-12-12 DIAGNOSIS — D509 Iron deficiency anemia, unspecified: Secondary | ICD-10-CM | POA: Diagnosis not present

## 2014-12-12 DIAGNOSIS — E1129 Type 2 diabetes mellitus with other diabetic kidney complication: Secondary | ICD-10-CM | POA: Diagnosis not present

## 2014-12-12 DIAGNOSIS — N186 End stage renal disease: Secondary | ICD-10-CM | POA: Diagnosis not present

## 2014-12-12 DIAGNOSIS — N2581 Secondary hyperparathyroidism of renal origin: Secondary | ICD-10-CM | POA: Diagnosis not present

## 2014-12-12 DIAGNOSIS — D631 Anemia in chronic kidney disease: Secondary | ICD-10-CM | POA: Diagnosis not present

## 2014-12-12 DIAGNOSIS — D508 Other iron deficiency anemias: Secondary | ICD-10-CM | POA: Diagnosis not present

## 2014-12-14 DIAGNOSIS — N2581 Secondary hyperparathyroidism of renal origin: Secondary | ICD-10-CM | POA: Diagnosis not present

## 2014-12-14 DIAGNOSIS — D508 Other iron deficiency anemias: Secondary | ICD-10-CM | POA: Diagnosis not present

## 2014-12-14 DIAGNOSIS — N186 End stage renal disease: Secondary | ICD-10-CM | POA: Diagnosis not present

## 2014-12-14 DIAGNOSIS — E1129 Type 2 diabetes mellitus with other diabetic kidney complication: Secondary | ICD-10-CM | POA: Diagnosis not present

## 2014-12-14 DIAGNOSIS — D509 Iron deficiency anemia, unspecified: Secondary | ICD-10-CM | POA: Diagnosis not present

## 2014-12-14 DIAGNOSIS — D631 Anemia in chronic kidney disease: Secondary | ICD-10-CM | POA: Diagnosis not present

## 2014-12-15 DIAGNOSIS — R0989 Other specified symptoms and signs involving the circulatory and respiratory systems: Secondary | ICD-10-CM | POA: Diagnosis not present

## 2014-12-15 DIAGNOSIS — I739 Peripheral vascular disease, unspecified: Secondary | ICD-10-CM | POA: Diagnosis not present

## 2014-12-15 DIAGNOSIS — I5032 Chronic diastolic (congestive) heart failure: Secondary | ICD-10-CM | POA: Diagnosis not present

## 2014-12-15 DIAGNOSIS — I2510 Atherosclerotic heart disease of native coronary artery without angina pectoris: Secondary | ICD-10-CM | POA: Diagnosis not present

## 2014-12-16 DIAGNOSIS — D508 Other iron deficiency anemias: Secondary | ICD-10-CM | POA: Diagnosis not present

## 2014-12-16 DIAGNOSIS — N2581 Secondary hyperparathyroidism of renal origin: Secondary | ICD-10-CM | POA: Diagnosis not present

## 2014-12-16 DIAGNOSIS — N186 End stage renal disease: Secondary | ICD-10-CM | POA: Diagnosis not present

## 2014-12-16 DIAGNOSIS — D631 Anemia in chronic kidney disease: Secondary | ICD-10-CM | POA: Diagnosis not present

## 2014-12-16 DIAGNOSIS — E1129 Type 2 diabetes mellitus with other diabetic kidney complication: Secondary | ICD-10-CM | POA: Diagnosis not present

## 2014-12-16 DIAGNOSIS — D509 Iron deficiency anemia, unspecified: Secondary | ICD-10-CM | POA: Diagnosis not present

## 2014-12-19 DIAGNOSIS — N186 End stage renal disease: Secondary | ICD-10-CM | POA: Diagnosis not present

## 2014-12-19 DIAGNOSIS — N2581 Secondary hyperparathyroidism of renal origin: Secondary | ICD-10-CM | POA: Diagnosis not present

## 2014-12-19 DIAGNOSIS — D509 Iron deficiency anemia, unspecified: Secondary | ICD-10-CM | POA: Diagnosis not present

## 2014-12-19 DIAGNOSIS — D508 Other iron deficiency anemias: Secondary | ICD-10-CM | POA: Diagnosis not present

## 2014-12-19 DIAGNOSIS — E1129 Type 2 diabetes mellitus with other diabetic kidney complication: Secondary | ICD-10-CM | POA: Diagnosis not present

## 2014-12-19 DIAGNOSIS — D631 Anemia in chronic kidney disease: Secondary | ICD-10-CM | POA: Diagnosis not present

## 2014-12-21 DIAGNOSIS — D631 Anemia in chronic kidney disease: Secondary | ICD-10-CM | POA: Diagnosis not present

## 2014-12-21 DIAGNOSIS — D508 Other iron deficiency anemias: Secondary | ICD-10-CM | POA: Diagnosis not present

## 2014-12-21 DIAGNOSIS — E1129 Type 2 diabetes mellitus with other diabetic kidney complication: Secondary | ICD-10-CM | POA: Diagnosis not present

## 2014-12-21 DIAGNOSIS — N186 End stage renal disease: Secondary | ICD-10-CM | POA: Diagnosis not present

## 2014-12-21 DIAGNOSIS — D509 Iron deficiency anemia, unspecified: Secondary | ICD-10-CM | POA: Diagnosis not present

## 2014-12-21 DIAGNOSIS — N2581 Secondary hyperparathyroidism of renal origin: Secondary | ICD-10-CM | POA: Diagnosis not present

## 2014-12-23 DIAGNOSIS — N186 End stage renal disease: Secondary | ICD-10-CM | POA: Diagnosis not present

## 2014-12-23 DIAGNOSIS — E1129 Type 2 diabetes mellitus with other diabetic kidney complication: Secondary | ICD-10-CM | POA: Diagnosis not present

## 2014-12-23 DIAGNOSIS — N2581 Secondary hyperparathyroidism of renal origin: Secondary | ICD-10-CM | POA: Diagnosis not present

## 2014-12-23 DIAGNOSIS — D508 Other iron deficiency anemias: Secondary | ICD-10-CM | POA: Diagnosis not present

## 2014-12-23 DIAGNOSIS — D631 Anemia in chronic kidney disease: Secondary | ICD-10-CM | POA: Diagnosis not present

## 2014-12-23 DIAGNOSIS — D509 Iron deficiency anemia, unspecified: Secondary | ICD-10-CM | POA: Diagnosis not present

## 2014-12-25 DIAGNOSIS — E1129 Type 2 diabetes mellitus with other diabetic kidney complication: Secondary | ICD-10-CM | POA: Diagnosis not present

## 2014-12-25 DIAGNOSIS — Z992 Dependence on renal dialysis: Secondary | ICD-10-CM | POA: Diagnosis not present

## 2014-12-25 DIAGNOSIS — N186 End stage renal disease: Secondary | ICD-10-CM | POA: Diagnosis not present

## 2014-12-26 DIAGNOSIS — D509 Iron deficiency anemia, unspecified: Secondary | ICD-10-CM | POA: Diagnosis not present

## 2014-12-26 DIAGNOSIS — N186 End stage renal disease: Secondary | ICD-10-CM | POA: Diagnosis not present

## 2014-12-26 DIAGNOSIS — D631 Anemia in chronic kidney disease: Secondary | ICD-10-CM | POA: Diagnosis not present

## 2014-12-26 DIAGNOSIS — N2581 Secondary hyperparathyroidism of renal origin: Secondary | ICD-10-CM | POA: Diagnosis not present

## 2014-12-28 DIAGNOSIS — D509 Iron deficiency anemia, unspecified: Secondary | ICD-10-CM | POA: Diagnosis not present

## 2014-12-28 DIAGNOSIS — N186 End stage renal disease: Secondary | ICD-10-CM | POA: Diagnosis not present

## 2014-12-28 DIAGNOSIS — D631 Anemia in chronic kidney disease: Secondary | ICD-10-CM | POA: Diagnosis not present

## 2014-12-28 DIAGNOSIS — N2581 Secondary hyperparathyroidism of renal origin: Secondary | ICD-10-CM | POA: Diagnosis not present

## 2014-12-30 DIAGNOSIS — D631 Anemia in chronic kidney disease: Secondary | ICD-10-CM | POA: Diagnosis not present

## 2014-12-30 DIAGNOSIS — N2581 Secondary hyperparathyroidism of renal origin: Secondary | ICD-10-CM | POA: Diagnosis not present

## 2014-12-30 DIAGNOSIS — D509 Iron deficiency anemia, unspecified: Secondary | ICD-10-CM | POA: Diagnosis not present

## 2014-12-30 DIAGNOSIS — N186 End stage renal disease: Secondary | ICD-10-CM | POA: Diagnosis not present

## 2015-01-02 DIAGNOSIS — D631 Anemia in chronic kidney disease: Secondary | ICD-10-CM | POA: Diagnosis not present

## 2015-01-02 DIAGNOSIS — N186 End stage renal disease: Secondary | ICD-10-CM | POA: Diagnosis not present

## 2015-01-02 DIAGNOSIS — D509 Iron deficiency anemia, unspecified: Secondary | ICD-10-CM | POA: Diagnosis not present

## 2015-01-02 DIAGNOSIS — N2581 Secondary hyperparathyroidism of renal origin: Secondary | ICD-10-CM | POA: Diagnosis not present

## 2015-01-04 DIAGNOSIS — N186 End stage renal disease: Secondary | ICD-10-CM | POA: Diagnosis not present

## 2015-01-04 DIAGNOSIS — D631 Anemia in chronic kidney disease: Secondary | ICD-10-CM | POA: Diagnosis not present

## 2015-01-04 DIAGNOSIS — D509 Iron deficiency anemia, unspecified: Secondary | ICD-10-CM | POA: Diagnosis not present

## 2015-01-04 DIAGNOSIS — N2581 Secondary hyperparathyroidism of renal origin: Secondary | ICD-10-CM | POA: Diagnosis not present

## 2015-01-05 ENCOUNTER — Encounter (HOSPITAL_COMMUNITY): Admission: RE | Payer: Self-pay | Source: Ambulatory Visit

## 2015-01-05 ENCOUNTER — Ambulatory Visit (HOSPITAL_COMMUNITY): Admission: RE | Admit: 2015-01-05 | Payer: 59 | Source: Ambulatory Visit | Admitting: Cardiology

## 2015-01-05 DIAGNOSIS — G44219 Episodic tension-type headache, not intractable: Secondary | ICD-10-CM | POA: Diagnosis not present

## 2015-01-05 DIAGNOSIS — G4489 Other headache syndrome: Secondary | ICD-10-CM | POA: Diagnosis not present

## 2015-01-05 DIAGNOSIS — G43009 Migraine without aura, not intractable, without status migrainosus: Secondary | ICD-10-CM | POA: Diagnosis not present

## 2015-01-05 DIAGNOSIS — R030 Elevated blood-pressure reading, without diagnosis of hypertension: Secondary | ICD-10-CM | POA: Diagnosis not present

## 2015-01-05 SURGERY — LEFT HEART CATH AND CORONARY ANGIOGRAPHY

## 2015-01-06 DIAGNOSIS — D509 Iron deficiency anemia, unspecified: Secondary | ICD-10-CM | POA: Diagnosis not present

## 2015-01-06 DIAGNOSIS — N2581 Secondary hyperparathyroidism of renal origin: Secondary | ICD-10-CM | POA: Diagnosis not present

## 2015-01-06 DIAGNOSIS — N186 End stage renal disease: Secondary | ICD-10-CM | POA: Diagnosis not present

## 2015-01-06 DIAGNOSIS — D631 Anemia in chronic kidney disease: Secondary | ICD-10-CM | POA: Diagnosis not present

## 2015-01-09 DIAGNOSIS — D509 Iron deficiency anemia, unspecified: Secondary | ICD-10-CM | POA: Diagnosis not present

## 2015-01-09 DIAGNOSIS — N186 End stage renal disease: Secondary | ICD-10-CM | POA: Diagnosis not present

## 2015-01-09 DIAGNOSIS — D631 Anemia in chronic kidney disease: Secondary | ICD-10-CM | POA: Diagnosis not present

## 2015-01-09 DIAGNOSIS — N2581 Secondary hyperparathyroidism of renal origin: Secondary | ICD-10-CM | POA: Diagnosis not present

## 2015-01-10 DIAGNOSIS — E1065 Type 1 diabetes mellitus with hyperglycemia: Secondary | ICD-10-CM | POA: Diagnosis not present

## 2015-01-10 DIAGNOSIS — I10 Essential (primary) hypertension: Secondary | ICD-10-CM | POA: Diagnosis not present

## 2015-01-10 DIAGNOSIS — N186 End stage renal disease: Secondary | ICD-10-CM | POA: Diagnosis not present

## 2015-01-10 DIAGNOSIS — Z1389 Encounter for screening for other disorder: Secondary | ICD-10-CM | POA: Diagnosis not present

## 2015-01-10 DIAGNOSIS — E782 Mixed hyperlipidemia: Secondary | ICD-10-CM | POA: Diagnosis not present

## 2015-01-10 DIAGNOSIS — E1021 Type 1 diabetes mellitus with diabetic nephropathy: Secondary | ICD-10-CM | POA: Diagnosis not present

## 2015-01-11 DIAGNOSIS — D509 Iron deficiency anemia, unspecified: Secondary | ICD-10-CM | POA: Diagnosis not present

## 2015-01-11 DIAGNOSIS — D631 Anemia in chronic kidney disease: Secondary | ICD-10-CM | POA: Diagnosis not present

## 2015-01-11 DIAGNOSIS — N186 End stage renal disease: Secondary | ICD-10-CM | POA: Diagnosis not present

## 2015-01-11 DIAGNOSIS — N2581 Secondary hyperparathyroidism of renal origin: Secondary | ICD-10-CM | POA: Diagnosis not present

## 2015-01-12 DIAGNOSIS — N186 End stage renal disease: Secondary | ICD-10-CM | POA: Diagnosis not present

## 2015-01-12 DIAGNOSIS — I871 Compression of vein: Secondary | ICD-10-CM | POA: Diagnosis not present

## 2015-01-12 DIAGNOSIS — Z992 Dependence on renal dialysis: Secondary | ICD-10-CM | POA: Diagnosis not present

## 2015-01-12 DIAGNOSIS — T82858D Stenosis of vascular prosthetic devices, implants and grafts, subsequent encounter: Secondary | ICD-10-CM | POA: Diagnosis not present

## 2015-01-13 DIAGNOSIS — D509 Iron deficiency anemia, unspecified: Secondary | ICD-10-CM | POA: Diagnosis not present

## 2015-01-13 DIAGNOSIS — N186 End stage renal disease: Secondary | ICD-10-CM | POA: Diagnosis not present

## 2015-01-13 DIAGNOSIS — N2581 Secondary hyperparathyroidism of renal origin: Secondary | ICD-10-CM | POA: Diagnosis not present

## 2015-01-13 DIAGNOSIS — D631 Anemia in chronic kidney disease: Secondary | ICD-10-CM | POA: Diagnosis not present

## 2015-01-16 DIAGNOSIS — N186 End stage renal disease: Secondary | ICD-10-CM | POA: Diagnosis not present

## 2015-01-16 DIAGNOSIS — D509 Iron deficiency anemia, unspecified: Secondary | ICD-10-CM | POA: Diagnosis not present

## 2015-01-16 DIAGNOSIS — D631 Anemia in chronic kidney disease: Secondary | ICD-10-CM | POA: Diagnosis not present

## 2015-01-16 DIAGNOSIS — N2581 Secondary hyperparathyroidism of renal origin: Secondary | ICD-10-CM | POA: Diagnosis not present

## 2015-01-17 DIAGNOSIS — I2510 Atherosclerotic heart disease of native coronary artery without angina pectoris: Secondary | ICD-10-CM | POA: Diagnosis not present

## 2015-01-17 DIAGNOSIS — H3563 Retinal hemorrhage, bilateral: Secondary | ICD-10-CM | POA: Diagnosis not present

## 2015-01-17 DIAGNOSIS — I10 Essential (primary) hypertension: Secondary | ICD-10-CM | POA: Diagnosis not present

## 2015-01-17 DIAGNOSIS — E1021 Type 1 diabetes mellitus with diabetic nephropathy: Secondary | ICD-10-CM | POA: Diagnosis not present

## 2015-01-17 DIAGNOSIS — H25813 Combined forms of age-related cataract, bilateral: Secondary | ICD-10-CM | POA: Diagnosis not present

## 2015-01-17 DIAGNOSIS — E11351 Type 2 diabetes mellitus with proliferative diabetic retinopathy with macular edema: Secondary | ICD-10-CM | POA: Diagnosis not present

## 2015-01-17 DIAGNOSIS — H3582 Retinal ischemia: Secondary | ICD-10-CM | POA: Diagnosis not present

## 2015-01-17 DIAGNOSIS — E782 Mixed hyperlipidemia: Secondary | ICD-10-CM | POA: Diagnosis not present

## 2015-01-18 DIAGNOSIS — N186 End stage renal disease: Secondary | ICD-10-CM | POA: Diagnosis not present

## 2015-01-18 DIAGNOSIS — D509 Iron deficiency anemia, unspecified: Secondary | ICD-10-CM | POA: Diagnosis not present

## 2015-01-18 DIAGNOSIS — D631 Anemia in chronic kidney disease: Secondary | ICD-10-CM | POA: Diagnosis not present

## 2015-01-18 DIAGNOSIS — N2581 Secondary hyperparathyroidism of renal origin: Secondary | ICD-10-CM | POA: Diagnosis not present

## 2015-01-20 DIAGNOSIS — N186 End stage renal disease: Secondary | ICD-10-CM | POA: Diagnosis not present

## 2015-01-20 DIAGNOSIS — D631 Anemia in chronic kidney disease: Secondary | ICD-10-CM | POA: Diagnosis not present

## 2015-01-20 DIAGNOSIS — D509 Iron deficiency anemia, unspecified: Secondary | ICD-10-CM | POA: Diagnosis not present

## 2015-01-20 DIAGNOSIS — N2581 Secondary hyperparathyroidism of renal origin: Secondary | ICD-10-CM | POA: Diagnosis not present

## 2015-01-23 DIAGNOSIS — N2581 Secondary hyperparathyroidism of renal origin: Secondary | ICD-10-CM | POA: Diagnosis not present

## 2015-01-23 DIAGNOSIS — N186 End stage renal disease: Secondary | ICD-10-CM | POA: Diagnosis not present

## 2015-01-23 DIAGNOSIS — M65322 Trigger finger, left index finger: Secondary | ICD-10-CM | POA: Diagnosis not present

## 2015-01-23 DIAGNOSIS — D631 Anemia in chronic kidney disease: Secondary | ICD-10-CM | POA: Diagnosis not present

## 2015-01-23 DIAGNOSIS — D509 Iron deficiency anemia, unspecified: Secondary | ICD-10-CM | POA: Diagnosis not present

## 2015-01-25 DIAGNOSIS — D631 Anemia in chronic kidney disease: Secondary | ICD-10-CM | POA: Diagnosis not present

## 2015-01-25 DIAGNOSIS — E1129 Type 2 diabetes mellitus with other diabetic kidney complication: Secondary | ICD-10-CM | POA: Diagnosis not present

## 2015-01-25 DIAGNOSIS — D509 Iron deficiency anemia, unspecified: Secondary | ICD-10-CM | POA: Diagnosis not present

## 2015-01-25 DIAGNOSIS — Z992 Dependence on renal dialysis: Secondary | ICD-10-CM | POA: Diagnosis not present

## 2015-01-25 DIAGNOSIS — N186 End stage renal disease: Secondary | ICD-10-CM | POA: Diagnosis not present

## 2015-01-25 DIAGNOSIS — N2581 Secondary hyperparathyroidism of renal origin: Secondary | ICD-10-CM | POA: Diagnosis not present

## 2015-01-27 DIAGNOSIS — D509 Iron deficiency anemia, unspecified: Secondary | ICD-10-CM | POA: Diagnosis not present

## 2015-01-27 DIAGNOSIS — D631 Anemia in chronic kidney disease: Secondary | ICD-10-CM | POA: Diagnosis not present

## 2015-01-27 DIAGNOSIS — N2581 Secondary hyperparathyroidism of renal origin: Secondary | ICD-10-CM | POA: Diagnosis not present

## 2015-01-27 DIAGNOSIS — E1129 Type 2 diabetes mellitus with other diabetic kidney complication: Secondary | ICD-10-CM | POA: Diagnosis not present

## 2015-01-27 DIAGNOSIS — N186 End stage renal disease: Secondary | ICD-10-CM | POA: Diagnosis not present

## 2015-01-30 DIAGNOSIS — D631 Anemia in chronic kidney disease: Secondary | ICD-10-CM | POA: Diagnosis not present

## 2015-01-30 DIAGNOSIS — E1129 Type 2 diabetes mellitus with other diabetic kidney complication: Secondary | ICD-10-CM | POA: Diagnosis not present

## 2015-01-30 DIAGNOSIS — D509 Iron deficiency anemia, unspecified: Secondary | ICD-10-CM | POA: Diagnosis not present

## 2015-01-30 DIAGNOSIS — N2581 Secondary hyperparathyroidism of renal origin: Secondary | ICD-10-CM | POA: Diagnosis not present

## 2015-01-30 DIAGNOSIS — N186 End stage renal disease: Secondary | ICD-10-CM | POA: Diagnosis not present

## 2015-02-01 DIAGNOSIS — N2581 Secondary hyperparathyroidism of renal origin: Secondary | ICD-10-CM | POA: Diagnosis not present

## 2015-02-01 DIAGNOSIS — D509 Iron deficiency anemia, unspecified: Secondary | ICD-10-CM | POA: Diagnosis not present

## 2015-02-01 DIAGNOSIS — N186 End stage renal disease: Secondary | ICD-10-CM | POA: Diagnosis not present

## 2015-02-01 DIAGNOSIS — D631 Anemia in chronic kidney disease: Secondary | ICD-10-CM | POA: Diagnosis not present

## 2015-02-01 DIAGNOSIS — E1129 Type 2 diabetes mellitus with other diabetic kidney complication: Secondary | ICD-10-CM | POA: Diagnosis not present

## 2015-02-02 DIAGNOSIS — I10 Essential (primary) hypertension: Secondary | ICD-10-CM | POA: Diagnosis not present

## 2015-02-02 DIAGNOSIS — L03116 Cellulitis of left lower limb: Secondary | ICD-10-CM | POA: Diagnosis not present

## 2015-02-03 DIAGNOSIS — D631 Anemia in chronic kidney disease: Secondary | ICD-10-CM | POA: Diagnosis not present

## 2015-02-03 DIAGNOSIS — N2581 Secondary hyperparathyroidism of renal origin: Secondary | ICD-10-CM | POA: Diagnosis not present

## 2015-02-03 DIAGNOSIS — N186 End stage renal disease: Secondary | ICD-10-CM | POA: Diagnosis not present

## 2015-02-03 DIAGNOSIS — E1129 Type 2 diabetes mellitus with other diabetic kidney complication: Secondary | ICD-10-CM | POA: Diagnosis not present

## 2015-02-03 DIAGNOSIS — D509 Iron deficiency anemia, unspecified: Secondary | ICD-10-CM | POA: Diagnosis not present

## 2015-02-06 DIAGNOSIS — E1129 Type 2 diabetes mellitus with other diabetic kidney complication: Secondary | ICD-10-CM | POA: Diagnosis not present

## 2015-02-06 DIAGNOSIS — D631 Anemia in chronic kidney disease: Secondary | ICD-10-CM | POA: Diagnosis not present

## 2015-02-06 DIAGNOSIS — N186 End stage renal disease: Secondary | ICD-10-CM | POA: Diagnosis not present

## 2015-02-06 DIAGNOSIS — N2581 Secondary hyperparathyroidism of renal origin: Secondary | ICD-10-CM | POA: Diagnosis not present

## 2015-02-06 DIAGNOSIS — D509 Iron deficiency anemia, unspecified: Secondary | ICD-10-CM | POA: Diagnosis not present

## 2015-02-07 DIAGNOSIS — L97521 Non-pressure chronic ulcer of other part of left foot limited to breakdown of skin: Secondary | ICD-10-CM | POA: Diagnosis not present

## 2015-02-08 DIAGNOSIS — N186 End stage renal disease: Secondary | ICD-10-CM | POA: Diagnosis not present

## 2015-02-08 DIAGNOSIS — N2581 Secondary hyperparathyroidism of renal origin: Secondary | ICD-10-CM | POA: Diagnosis not present

## 2015-02-08 DIAGNOSIS — E1129 Type 2 diabetes mellitus with other diabetic kidney complication: Secondary | ICD-10-CM | POA: Diagnosis not present

## 2015-02-08 DIAGNOSIS — D509 Iron deficiency anemia, unspecified: Secondary | ICD-10-CM | POA: Diagnosis not present

## 2015-02-08 DIAGNOSIS — D631 Anemia in chronic kidney disease: Secondary | ICD-10-CM | POA: Diagnosis not present

## 2015-02-09 ENCOUNTER — Emergency Department (HOSPITAL_COMMUNITY): Payer: Medicare Other

## 2015-02-09 ENCOUNTER — Observation Stay (HOSPITAL_COMMUNITY): Payer: Medicare Other

## 2015-02-09 ENCOUNTER — Inpatient Hospital Stay (HOSPITAL_COMMUNITY)
Admission: RE | Admit: 2015-02-09 | Discharge: 2015-02-18 | DRG: 492 | Disposition: A | Discharge status: Home / Self Care | Payer: Medicare Other | Source: Ambulatory Visit | Attending: Internal Medicine | Admitting: Internal Medicine

## 2015-02-09 ENCOUNTER — Emergency Department (HOSPITAL_COMMUNITY)
Admission: EM | Admit: 2015-02-09 | Discharge: 2015-02-09 | Disposition: A | Discharge status: Home / Self Care | Payer: Medicare Other | Source: Home / Self Care | Attending: Emergency Medicine | Admitting: Emergency Medicine

## 2015-02-09 ENCOUNTER — Ambulatory Visit (HOSPITAL_COMMUNITY)
Admission: RE | Admit: 2015-02-09 | Discharge: 2015-02-09 | Discharge status: Absent without leave | Payer: Medicare Other | Source: Ambulatory Visit | Attending: Cardiology | Admitting: Cardiology

## 2015-02-09 ENCOUNTER — Encounter (HOSPITAL_COMMUNITY): Payer: Self-pay | Admitting: Emergency Medicine

## 2015-02-09 DIAGNOSIS — J811 Chronic pulmonary edema: Secondary | ICD-10-CM | POA: Diagnosis not present

## 2015-02-09 DIAGNOSIS — D638 Anemia in other chronic diseases classified elsewhere: Secondary | ICD-10-CM | POA: Diagnosis present

## 2015-02-09 DIAGNOSIS — W1830XA Fall on same level, unspecified, initial encounter: Secondary | ICD-10-CM | POA: Diagnosis present

## 2015-02-09 DIAGNOSIS — E785 Hyperlipidemia, unspecified: Secondary | ICD-10-CM | POA: Diagnosis present

## 2015-02-09 DIAGNOSIS — S82853A Displaced trimalleolar fracture of unspecified lower leg, initial encounter for closed fracture: Secondary | ICD-10-CM

## 2015-02-09 DIAGNOSIS — E1165 Type 2 diabetes mellitus with hyperglycemia: Secondary | ICD-10-CM | POA: Diagnosis present

## 2015-02-09 DIAGNOSIS — S82841A Displaced bimalleolar fracture of right lower leg, initial encounter for closed fracture: Secondary | ICD-10-CM

## 2015-02-09 DIAGNOSIS — E1322 Other specified diabetes mellitus with diabetic chronic kidney disease: Secondary | ICD-10-CM | POA: Diagnosis present

## 2015-02-09 DIAGNOSIS — R200 Anesthesia of skin: Secondary | ICD-10-CM | POA: Diagnosis not present

## 2015-02-09 DIAGNOSIS — I1311 Hypertensive heart and chronic kidney disease without heart failure, with stage 5 chronic kidney disease, or end stage renal disease: Secondary | ICD-10-CM | POA: Diagnosis present

## 2015-02-09 DIAGNOSIS — S82844A Nondisplaced bimalleolar fracture of right lower leg, initial encounter for closed fracture: Secondary | ICD-10-CM | POA: Diagnosis not present

## 2015-02-09 DIAGNOSIS — E1122 Type 2 diabetes mellitus with diabetic chronic kidney disease: Secondary | ICD-10-CM | POA: Diagnosis present

## 2015-02-09 DIAGNOSIS — I739 Peripheral vascular disease, unspecified: Secondary | ICD-10-CM

## 2015-02-09 DIAGNOSIS — Z992 Dependence on renal dialysis: Secondary | ICD-10-CM

## 2015-02-09 DIAGNOSIS — N186 End stage renal disease: Secondary | ICD-10-CM

## 2015-02-09 DIAGNOSIS — S82851A Displaced trimalleolar fracture of right lower leg, initial encounter for closed fracture: Principal | ICD-10-CM | POA: Diagnosis present

## 2015-02-09 DIAGNOSIS — N2581 Secondary hyperparathyroidism of renal origin: Secondary | ICD-10-CM | POA: Diagnosis present

## 2015-02-09 DIAGNOSIS — E1365 Other specified diabetes mellitus with hyperglycemia: Secondary | ICD-10-CM

## 2015-02-09 DIAGNOSIS — S82899A Other fracture of unspecified lower leg, initial encounter for closed fracture: Secondary | ICD-10-CM

## 2015-02-09 DIAGNOSIS — E1151 Type 2 diabetes mellitus with diabetic peripheral angiopathy without gangrene: Secondary | ICD-10-CM | POA: Diagnosis present

## 2015-02-09 DIAGNOSIS — I2510 Atherosclerotic heart disease of native coronary artery without angina pectoris: Secondary | ICD-10-CM | POA: Diagnosis present

## 2015-02-09 DIAGNOSIS — R52 Pain, unspecified: Secondary | ICD-10-CM | POA: Insufficient documentation

## 2015-02-09 DIAGNOSIS — E11610 Type 2 diabetes mellitus with diabetic neuropathic arthropathy: Secondary | ICD-10-CM | POA: Diagnosis present

## 2015-02-09 DIAGNOSIS — IMO0002 Reserved for concepts with insufficient information to code with codable children: Secondary | ICD-10-CM | POA: Diagnosis present

## 2015-02-09 DIAGNOSIS — I10 Essential (primary) hypertension: Secondary | ICD-10-CM | POA: Diagnosis present

## 2015-02-09 DIAGNOSIS — E1142 Type 2 diabetes mellitus with diabetic polyneuropathy: Secondary | ICD-10-CM | POA: Diagnosis present

## 2015-02-09 DIAGNOSIS — Z7902 Long term (current) use of antithrombotics/antiplatelets: Secondary | ICD-10-CM

## 2015-02-09 DIAGNOSIS — Z87891 Personal history of nicotine dependence: Secondary | ICD-10-CM

## 2015-02-09 DIAGNOSIS — R079 Chest pain, unspecified: Secondary | ICD-10-CM | POA: Diagnosis present

## 2015-02-09 DIAGNOSIS — M25571 Pain in right ankle and joints of right foot: Secondary | ICD-10-CM | POA: Diagnosis not present

## 2015-02-09 DIAGNOSIS — S82843A Displaced bimalleolar fracture of unspecified lower leg, initial encounter for closed fracture: Secondary | ICD-10-CM | POA: Diagnosis present

## 2015-02-09 DIAGNOSIS — Z419 Encounter for procedure for purposes other than remedying health state, unspecified: Secondary | ICD-10-CM

## 2015-02-09 DIAGNOSIS — E1140 Type 2 diabetes mellitus with diabetic neuropathy, unspecified: Secondary | ICD-10-CM | POA: Diagnosis not present

## 2015-02-09 DIAGNOSIS — I25110 Atherosclerotic heart disease of native coronary artery with unstable angina pectoris: Secondary | ICD-10-CM | POA: Diagnosis not present

## 2015-02-09 DIAGNOSIS — Z794 Long term (current) use of insulin: Secondary | ICD-10-CM

## 2015-02-09 HISTORY — DX: Displaced fracture of fifth metatarsal bone, unspecified foot, initial encounter for closed fracture: S92.353A

## 2015-02-09 HISTORY — DX: Displaced trimalleolar fracture of unspecified lower leg, initial encounter for closed fracture: S82.853A

## 2015-02-09 LAB — CBC WITH DIFFERENTIAL/PLATELET
BASOS PCT: 0 %
Basophils Absolute: 0 10*3/uL (ref 0.0–0.1)
EOS ABS: 0.1 10*3/uL (ref 0.0–0.7)
Eosinophils Relative: 1 %
HEMATOCRIT: 32.2 % — AB (ref 36.0–46.0)
HEMOGLOBIN: 10.4 g/dL — AB (ref 12.0–15.0)
LYMPHS ABS: 1.7 10*3/uL (ref 0.7–4.0)
Lymphocytes Relative: 18 %
MCH: 31.4 pg (ref 26.0–34.0)
MCHC: 32.3 g/dL (ref 30.0–36.0)
MCV: 97.3 fL (ref 78.0–100.0)
MONO ABS: 0.8 10*3/uL (ref 0.1–1.0)
MONOS PCT: 8 %
NEUTROS PCT: 73 %
Neutro Abs: 6.9 10*3/uL (ref 1.7–7.7)
Platelets: 141 10*3/uL — ABNORMAL LOW (ref 150–400)
RBC: 3.31 MIL/uL — ABNORMAL LOW (ref 3.87–5.11)
RDW: 15.9 % — AB (ref 11.5–15.5)
WBC: 9.6 10*3/uL (ref 4.0–10.5)

## 2015-02-09 MED ORDER — HYDROCODONE-ACETAMINOPHEN 5-325 MG PO TABS
1.0000 | ORAL_TABLET | Freq: Once | ORAL | Status: AC
  Administered 2015-02-09: 1 via ORAL
  Filled 2015-02-09: qty 1

## 2015-02-09 MED ORDER — CLOPIDOGREL BISULFATE 75 MG PO TABS
75.0000 mg | ORAL_TABLET | Freq: Every day | ORAL | Status: DC
  Administered 2015-02-10 – 2015-02-17 (×8): 75 mg via ORAL
  Filled 2015-02-09 (×9): qty 1

## 2015-02-09 MED ORDER — OXYCODONE HCL 5 MG PO TABS
5.0000 mg | ORAL_TABLET | ORAL | Status: DC | PRN
  Administered 2015-02-09 – 2015-02-13 (×12): 5 mg via ORAL
  Filled 2015-02-09 (×12): qty 1

## 2015-02-09 MED ORDER — SEVELAMER CARBONATE 800 MG PO TABS
800.0000 mg | ORAL_TABLET | Freq: Three times a day (TID) | ORAL | Status: DC
  Administered 2015-02-11 – 2015-02-15 (×10): 800 mg via ORAL
  Filled 2015-02-09 (×13): qty 1

## 2015-02-09 MED ORDER — HYDRALAZINE HCL 50 MG PO TABS
50.0000 mg | ORAL_TABLET | Freq: Three times a day (TID) | ORAL | Status: DC
  Administered 2015-02-10 – 2015-02-18 (×13): 50 mg via ORAL
  Filled 2015-02-09 (×17): qty 1

## 2015-02-09 MED ORDER — ONDANSETRON HCL 4 MG PO TABS
4.0000 mg | ORAL_TABLET | Freq: Four times a day (QID) | ORAL | Status: DC | PRN
Start: 2015-02-09 — End: 2015-02-18

## 2015-02-09 MED ORDER — ONDANSETRON HCL 4 MG/2ML IJ SOLN: 4.0000 mg | Freq: Four times a day (QID) | INTRAMUSCULAR | Status: DC | PRN

## 2015-02-09 MED ORDER — HEPARIN SODIUM (PORCINE) 5000 UNIT/ML IJ SOLN
5000.0000 [IU] | Freq: Three times a day (TID) | INTRAMUSCULAR | Status: DC
  Administered 2015-02-09 – 2015-02-10 (×2): 5000 [IU] via SUBCUTANEOUS
  Filled 2015-02-09 (×2): qty 1

## 2015-02-09 MED ORDER — ATORVASTATIN CALCIUM 80 MG PO TABS
80.0000 mg | ORAL_TABLET | Freq: Every evening | ORAL | Status: DC
Start: 2015-02-09 — End: 2015-02-10
  Administered 2015-02-10: 80 mg via ORAL
  Filled 2015-02-09: qty 1

## 2015-02-09 MED ORDER — ISOSORBIDE MONONITRATE ER 60 MG PO TB24: 120.0000 mg | ORAL_TABLET | Freq: Every day | ORAL | Status: DC

## 2015-02-09 MED ORDER — LABETALOL HCL 300 MG PO TABS
300.0000 mg | ORAL_TABLET | Freq: Three times a day (TID) | ORAL | Status: DC
  Administered 2015-02-10 – 2015-02-18 (×14): 300 mg via ORAL
  Filled 2015-02-09 (×33): qty 1

## 2015-02-09 MED ORDER — GABAPENTIN 300 MG PO CAPS
300.0000 mg | ORAL_CAPSULE | Freq: Every evening | ORAL | Status: DC
  Filled 2015-02-09 (×3): qty 1

## 2015-02-09 MED ORDER — DOCUSATE SODIUM 100 MG PO CAPS
100.0000 mg | ORAL_CAPSULE | Freq: Two times a day (BID) | ORAL | Status: DC
  Administered 2015-02-09 – 2015-02-18 (×16): 100 mg via ORAL
  Filled 2015-02-09 (×16): qty 1

## 2015-02-09 MED ORDER — AMLODIPINE BESYLATE 10 MG PO TABS
10.0000 mg | ORAL_TABLET | Freq: Every day | ORAL | Status: DC
  Administered 2015-02-10 – 2015-02-11 (×2): 10 mg via ORAL
  Filled 2015-02-09 (×3): qty 1

## 2015-02-09 MED ORDER — SODIUM CHLORIDE 0.9 % IJ SOLN
3.0000 mL | Freq: Two times a day (BID) | INTRAMUSCULAR | Status: DC
  Administered 2015-02-09 – 2015-02-17 (×11): 3 mL via INTRAVENOUS

## 2015-02-09 MED ORDER — CLONIDINE HCL 0.2 MG PO TABS
0.3000 mg | ORAL_TABLET | Freq: Three times a day (TID) | ORAL | Status: DC
  Administered 2015-02-10 – 2015-02-18 (×21): 0.3 mg via ORAL
  Filled 2015-02-09 (×29): qty 1

## 2015-02-09 MED ORDER — HYDROCODONE-ACETAMINOPHEN 5-325 MG PO TABS: 1.0000 | ORAL_TABLET | ORAL | Status: DC | PRN

## 2015-02-09 MED ORDER — NITROGLYCERIN 0.4 MG SL SUBL: 0.4000 mg | SUBLINGUAL_TABLET | SUBLINGUAL | Status: DC | PRN

## 2015-02-09 MED ORDER — ASPIRIN EC 81 MG PO TBEC
81.0000 mg | DELAYED_RELEASE_TABLET | Freq: Every day | ORAL | Status: DC
  Administered 2015-02-10 – 2015-02-18 (×8): 81 mg via ORAL
  Filled 2015-02-09 (×9): qty 1

## 2015-02-09 MED ORDER — HYDROMORPHONE HCL 1 MG/ML IJ SOLN
1.0000 mg | INTRAMUSCULAR | Status: DC | PRN
  Administered 2015-02-09 – 2015-02-14 (×21): 1 mg via INTRAVENOUS
  Filled 2015-02-09 (×22): qty 1

## 2015-02-09 MED ORDER — CINACALCET HCL 30 MG PO TABS
30.0000 mg | ORAL_TABLET | Freq: Every day | ORAL | Status: DC
Start: 2015-02-10 — End: 2015-02-18
  Administered 2015-02-11 – 2015-02-18 (×7): 30 mg via ORAL
  Filled 2015-02-09 (×11): qty 1

## 2015-02-09 NOTE — H&P (Signed)
Referring Physician: Dr. Theodis Sato. [place]/Dr. Merrilee Seashore  [name] is an 39 y.o. female.                       Chief Complaint: Shortness of breath and known CAD  HPI: 39 year old female with ESRD, PVD, CAD, DM, II and recent bimalleolar fracture of right ankle was awaiting cardiac catheterization on 02/21/2015 by Dr. Einar Gip. Patient has need for pre-op clearance and will be scheduled for cardiac catheterization in AM by me. Patient agreeable to the procedure and understood risks and alternatives.  Past Medical History  Diagnosis Date  . DM (diabetes mellitus)     Long-term insulin  . Hypertension   . Tobacco use disorder     Discontinued March 2012  . Hyperlipidemia     Hypertriglyceridemia 449 HDL 25  . Diabetic Charcot's joint disease   . Coronary artery disease     Status post cardiac catheterization June 2012 scattered coronary artery disease/atherosclerosis with 70-80% stenosis in a small right PDA.  . Gastroparesis   . Peripheral vascular disease     Tibial occlusive disease evaluated by Dr. Kellie Simmering in August 2011. Medical therapy  . 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 CIN III (cervical intraepithelial neoplasia grade III) with severe dysplasia     S/P LEEP AND [facility]  . Chronic anemia     2nd to renal disease  . End stage renal disease on dialysis 05/02/11    [facility]; M; W, F; last time 05/01/11  . Migraines     "just on dialysis days"  . Peripheral neuropathy     related to DM  . History of abscesses in groins 12/06/2010  . Renal insufficiency     Dialysis since 2012      Past Surgical History  Procedure Laterality Date  . Av fistula placement  04/2010  . Dilation and curettage of uterus  2009  . Cervical biopsy  w/ loop electrode excision      h/o  . Cervical cone biopsy      h/o  . Colposcopy      Family History  Problem Relation Age of Onset  . Diabetes Mother   . Hypertension Mother   . Diabetes Father   . Hyperlipidemia Father   . Hypertension  Father   . Diabetes Sister   . Breast cancer Maternal Aunt     Age 28's  . Stroke Maternal Grandmother   . Diabetes Sister    Social History:  reports that she quit smoking about 3 years ago. Her smoking use included Cigarettes. She has a 3 pack-year smoking history. She has never used smokeless tobacco. She reports that she does not drink alcohol or use illicit drugs.  Allergies:  Allergies  Allergen Reactions  . Cephalexin Other (See Comments)    Reaction unknown  . Sulfamethoxazole-Trimethoprim Other (See Comments)    Thrush    Medications Prior to Admission  Medication Sig Dispense Refill  . amLODipine (NORVASC) 10 MG tablet Take 1 tablet (10 mg total) by mouth daily. 30 tablet 0  . atorvastatin (LIPITOR) 80 MG tablet Take 1 tablet (80 mg total) by mouth every evening. 30 tablet 3  . chlorpheniramine-HYDROcodone (TUSSIONEX PENNKINETIC ER) 10-8 MG/5ML SUER Take 5 mLs by mouth 2 (two) times daily. 60 mL 0  . cinacalcet (SENSIPAR) 30 MG tablet Take 30 mg by mouth daily.    . cloNIDine (CATAPRES) 0.3 MG tablet Take 1 tablet (0.3 mg total)  by mouth 3 (three) times daily. 90 tablet 0  . clopidogrel (PLAVIX) 75 MG tablet Take 75 mg by mouth daily with breakfast.    . gabapentin (NEURONTIN) 300 MG capsule Take 300 mg by mouth every evening.     Marland Kitchen HUMALOG MIX 50/50 KWIKPEN (50-50) 100 UNIT/ML Kwikpen Inject 26-28 Units into the skin 2 (two) times daily. 28 in the morning and 26 in the evening.    . hydrALAZINE (APRESOLINE) 50 MG tablet Take 1 tablet (50 mg total) by mouth every 8 (eight) hours. 90 tablet 0  . HYDROcodone-acetaminophen (NORCO/VICODIN) 5-325 MG per tablet Take 1-2 tablets by mouth every 4 (four) hours as needed for moderate pain or severe pain. 15 tablet 0  . isosorbide mononitrate (IMDUR) 120 MG 24 hr tablet Take 120 mg by mouth daily.    Marland Kitchen labetalol (NORMODYNE) 300 MG tablet Take 1 tablet (300 mg total) by mouth 3 (three) times daily. 270 tablet 3  . nitroGLYCERIN  (NITROSTAT) 0.4 MG SL tablet Place 0.4 mg under the tongue every 5 (five) minutes as needed. For chest pain    . ondansetron (ZOFRAN) 4 MG tablet Take 1 tablet (4 mg total) by mouth every 8 (eight) hours as needed for nausea or vomiting. 15 tablet 0  . RENVELA 800 MG tablet Take 1 tablet by mouth 4 (four) times daily - after meals and at bedtime.      No results found for this or any previous visit (from the past 48 hour(s)). Dg Ankle Complete Right  02/09/2015   CLINICAL DATA:  Pain following fall in parking lot earlier today  EXAM: RIGHT ANKLE - COMPLETE 3+ VIEW  COMPARISON:  April 10, 2007  FINDINGS: Frontal, oblique, and lateral views obtained. There is a comminuted fracture of the medial aspect of the distal tibial metaphysis with several mildly displaced fracture fragments. There is a fracture of the posterior aspect of the distal tibial metaphysis with alignment essentially anatomic. There is a comminuted fracture of the lateral malleolus with several distal fracture fragments displaced inferiorly. There is marked soft tissue swelling laterally. There is a small joint effusion. The ankle mortise appears disrupted on the oblique view. There are foci of arterial vascular calcification.  IMPRESSION: Bimalleolar type fracture with displaced fractures medially and laterally with a nondisplaced fracture of the posterior distal tibial metaphysis. There is ankle mortise disruption. There is marked soft tissue swelling laterally with joint effusion. There are multiple foci of arterial vascular calcification.   Electronically Signed   By: Lowella Grip III M.D.   On: 02/09/2015 14:24    Review Of Systems General: no fevers, chills, no changes in body weight, has poor appetite, has fatigue HEENT: no blurry vision, hearing changes or sore throat Pulm: has dyspnea, coughing, no wheezing CV: had chest pain, no palpitations Abd: no nausea, vomiting, abdominal pain, diarrhea, constipation GU: no  dysuria, burning on urination, increased urinary frequency, hematuria  Ext: no leg edema Neuro: no unilateral weakness, numbness, or tingling, no vision change or hearing loss Skin: no rash MSK: No muscle spasm, no deformity, no limitation of range of movement in spin Heme: No easy bruising.  Travel history: No recent long distant travel.  Blood pressure 148/65, pulse 84, temperature 97.8 F (36.6 C), temperature source Oral, resp. rate 20, last menstrual period 01/09/2015, SpO2 96 %. General: In acute distress from right ankle pain HEENT: Eyes: Conj-pale, PERRL, EOMI, no scleral icterus. No discharge from the ears and nose, no pharynx injection, no  tonsillar enlargement.  Neck: No JVD, no bruit, no mass felt. Cardiac: S1/S2, RRR, No gallops or rubs. II/VI systolic murmur. Pulm: No rales, wheezing, rhonchi or rubs. Abd: Soft, nondistended, nontender, no rebound pain, no organomegaly, BS present. Ext: No pitting leg edema bilaterally. 2+DP/PT pulse bilaterally. LUE AVF functioning. Musculoskeletal: Right ankle and lower leg with immobilizer. No left leg edema. Skin: No rashes.  Neuro: Alert, oriented X3, cranial nerves II-XII grossly intact, sensation to light touch intact.  Psych: Patient is not psychotic, no suicidal or hemocidal ideation.  Assessment/Plan CAD Anemia of chronic disease ESRD DM, II Dyslipidemia Hypertension Right ankle bimalleolar fracture  CXR/Lab/Home medications and analgesic C.cath in AM  [facility] S, MD  02/09/2015, 10:56 PM

## 2015-02-09 NOTE — ED Provider Notes (Signed)
CSN: [id number]   Arrival date & time 02/09/15 1341  History  This chart was scribed for non-physician practitioner,  Waynetta Pean, PA-C working with Serita Grit, MD by Altamease Oiler, ED Scribe. This patient was seen in room WTR5/WTR5 and the patient's care was started at 2:34 PM.  Chief Complaint  Patient presents with  . Ankle Injury    HPI The history is provided by the patient. No language interpreter was used.   [name] is a 39 y.o. female with PMHx of diabetic charcot's disease who presents to the Emergency Department complaining of constant right lateral ankle pain with sudden onset this afternoon after an injury in a parking lot. She does not remember how she injured her ankle but denies tripping.  The pain is described as throbbing and rated 10/10 in severity. She took nothing for pain PTA. Associated symptoms include right ankle swelling and worsened numbness in the right foot(pt states that she always has some numbness but this is worse). No previous ankle injury. She denies fevers, weakness, or LOC.   Past Medical History  Diagnosis Date  . DM (diabetes mellitus)     Long-term insulin  . Hypertension   . Tobacco use disorder     Discontinued March 2012  . Hyperlipidemia     Hypertriglyceridemia 449 HDL 25  . Diabetic Charcot's joint disease   . Coronary artery disease     Status post cardiac catheterization June 2012 scattered coronary artery disease/atherosclerosis with 70-80% stenosis in a small right PDA.  . Gastroparesis   . Peripheral vascular disease     Tibial occlusive disease evaluated by Dr. Kellie Simmering in August 2011. Medical therapy  . 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 CIN III (cervical intraepithelial neoplasia grade III) with severe dysplasia     S/P LEEP AND [facility]  . Chronic anemia     2nd to renal disease  . End stage renal disease on dialysis 05/02/11    [facility]; M; W, F; last time 05/01/11  . Migraines     "just on dialysis days"  . Peripheral  neuropathy     related to DM  . History of abscesses in groins 12/06/2010  . Renal insufficiency     Dialysis since 2012    Past Surgical History  Procedure Laterality Date  . Av fistula placement  04/2010  . Dilation and curettage of uterus  2009  . Cervical biopsy  w/ loop electrode excision      h/o  . Cervical cone biopsy      h/o  . Colposcopy      Family History  Problem Relation Age of Onset  . Diabetes Mother   . Hypertension Mother   . Diabetes Father   . Hyperlipidemia Father   . Hypertension Father   . Diabetes Sister   . Breast cancer Maternal Aunt     Age 1's  . Stroke Maternal Grandmother   . Diabetes Sister     Social History  Substance Use Topics  . Smoking status: Former Smoker -- 0.30 packs/day for 10 years    Types: Cigarettes    Quit date: 08/05/2011  . Smokeless tobacco: Never Used     Comment: "quit smoking cigarettes 07/2010"  . Alcohol Use: No     Review of Systems  Constitutional: Negative for fever.  Musculoskeletal: Positive for joint swelling and arthralgias.       Right ankle pain and swelling  Skin: Negative for rash and wound.  Neurological:  Positive for numbness. Negative for syncope and weakness.    Home Medications   Prior to Admission medications   Medication Sig Start Date End Date Taking? Authorizing Provider  amLODipine (NORVASC) 10 MG tablet Take 1 tablet (10 mg total) by mouth daily. 07/05/13   Minus Breeding, MD  atorvastatin (LIPITOR) 80 MG tablet Take 1 tablet (80 mg total) by mouth every evening. 02/06/12   Donney Dice, PA-C  chlorpheniramine-HYDROcodone (TUSSIONEX PENNKINETIC ER) 10-8 MG/5ML SUER Take 5 mLs by mouth 2 (two) times daily. 10/29/14   Roselee Culver, MD  cinacalcet (SENSIPAR) 30 MG tablet Take 30 mg by mouth daily.    Historical Provider, MD  cloNIDine (CATAPRES) 0.3 MG tablet Take 1 tablet (0.3 mg total) by mouth 3 (three) times daily. 11/01/14   Debbe Odea, MD  clopidogrel (PLAVIX) 75 MG tablet Take  75 mg by mouth daily with breakfast.    Historical Provider, MD  gabapentin (NEURONTIN) 300 MG capsule Take 300 mg by mouth every evening.  06/19/13   Historical Provider, MD  HUMALOG MIX 50/50 KWIKPEN (50-50) 100 UNIT/ML Kwikpen Inject 26-28 Units into the skin 2 (two) times daily. 28 in the morning and 26 in the evening. 04/29/14   Historical Provider, MD  hydrALAZINE (APRESOLINE) 50 MG tablet Take 1 tablet (50 mg total) by mouth every 8 (eight) hours. 11/01/14   Debbe Odea, MD  HYDROcodone-acetaminophen (NORCO/VICODIN) 5-325 MG per tablet Take 1-2 tablets by mouth every 4 (four) hours as needed for moderate pain or severe pain. 02/09/15   Waynetta Pean, PA-C  isosorbide mononitrate (IMDUR) 120 MG 24 hr tablet Take 120 mg by mouth daily.    Historical Provider, MD  labetalol (NORMODYNE) 300 MG tablet Take 1 tablet (300 mg total) by mouth 3 (three) times daily. 11/01/14   Debbe Odea, MD  nitroGLYCERIN (NITROSTAT) 0.4 MG SL tablet Place 0.4 mg under the tongue every 5 (five) minutes as needed. For chest pain    Historical Provider, MD  ondansetron (ZOFRAN) 4 MG tablet Take 1 tablet (4 mg total) by mouth every 8 (eight) hours as needed for nausea or vomiting. 05/24/14   Clayton Bibles, PA-C  RENVELA 800 MG tablet Take 1 tablet by mouth 4 (four) times daily - after meals and at bedtime. 04/17/13   Historical Provider, MD    Allergies  Cephalexin and Sulfamethoxazole-trimethoprim  Triage Vitals: BP 133/76 mmHg  Pulse 89  Temp(Src) 98.9 F (37.2 C) (Oral)  Resp 18  SpO2 100%  LMP 01/09/2015  Physical Exam  Constitutional: She appears well-developed and well-nourished. No distress.  Nontoxic appearing.  HENT:  Head: Normocephalic and atraumatic.  Eyes: Right eye exhibits no discharge. Left eye exhibits no discharge.  Cardiovascular: Normal rate and intact distal pulses.   Brisk capillary refill to right distal toes.  Bilateral DP pulses intact   Pulmonary/Chest: Effort normal. No respiratory  distress.  Musculoskeletal: She exhibits edema and tenderness.  Generalized ankle edema which is worse to her lateral aspect. Tenderness worse to her lateral aspect of her right ankle. Compartments feel soft. Pain with active range of motion.   Neurological: She is alert. Coordination normal.  Patient reports sensation is decreased to her right foot.   Skin: Skin is warm and dry. No rash noted. She is not diaphoretic. No erythema. No pallor.  Psychiatric: She has a normal mood and affect. Her behavior is normal.  Nursing note and vitals reviewed.   ED Course  Procedures  DIAGNOSTIC STUDIES: Oxygen Saturation  is 100% on RA, normal by my interpretation.    COORDINATION OF CARE: 2:43 PM Discussed treatment plan which includes right ankle XR and  pain management with pt at bedside and pt agreed to the plan.  3:13 PM-Consult complete with PA Hardin Negus (Orthopedic surgery). Patient case explained and discussed. He will speak with Dr. Mayer Camel and call back. Call ended at 3:14 PM  3:30 PM D/w Dr. Mayer Camel who recommends posterior splint with stirrup and have the patient come by his office after leaving the ED.   3:31 PM I re-evaluated the patient and provided an update on ortho's recommendations.   Labs Review- Labs Reviewed - No data to display  Imaging Review Dg Ankle Complete Right  02/09/2015   CLINICAL DATA:  Pain following fall in parking lot earlier today  EXAM: RIGHT ANKLE - COMPLETE 3+ VIEW  COMPARISON:  April 10, 2007  FINDINGS: Frontal, oblique, and lateral views obtained. There is a comminuted fracture of the medial aspect of the distal tibial metaphysis with several mildly displaced fracture fragments. There is a fracture of the posterior aspect of the distal tibial metaphysis with alignment essentially anatomic. There is a comminuted fracture of the lateral malleolus with several distal fracture fragments displaced inferiorly. There is marked soft tissue swelling laterally. There is a  small joint effusion. The ankle mortise appears disrupted on the oblique view. There are foci of arterial vascular calcification.  IMPRESSION: Bimalleolar type fracture with displaced fractures medially and laterally with a nondisplaced fracture of the posterior distal tibial metaphysis. There is ankle mortise disruption. There is marked soft tissue swelling laterally with joint effusion. There are multiple foci of arterial vascular calcification.   Electronically Signed   By: Lowella Grip III M.D.   On: 02/09/2015 14:24        MDM   Meds given in ED:  Medications  HYDROcodone-acetaminophen (NORCO/VICODIN) 5-325 MG per tablet 1 tablet (1 tablet Oral Given 02/09/15 1448)    New Prescriptions   HYDROCODONE-ACETAMINOPHEN (NORCO/VICODIN) 5-325 MG PER TABLET    Take 1-2 tablets by mouth every 4 (four) hours as needed for moderate pain or severe pain.    Final diagnoses:  Bimalleolar ankle fracture, right, closed, initial encounter    This is a 39 year old female with a history of Charcot's foot who presents to the emergency department after she was walking and injured her ankle in a parking lot. She is unsure exactly how she obtained the injury. She denies loss of consciousness. The patient claims a 10 out of 10 pain to her right ankle. On exam the patient has generalized ankle edema with worse edema on her lateral aspect. No obvious deformity.Good capillary refill and dorsalis pedis pulse to her right foot. Patient reports her sensation is decreased to her right foot- she reports chronic numbness, but reports this has worsened. X-ray indicates a bimalleolar type fracture with displaced fractures medially and laterally. There is ankle mortise disruption.  I consulted Dr. Mayer Camel from orthopedics who would like [place] to place her in a posterior splint with stirrup and make her nonweightbearing with crutches. He would also like the patient to come by his office as soon as she is discharged so that  they can speak with her about the plan for surgery. The patient is in agreement with this plan. We'll discharge with prescription for Norco to use as needed for pain control. I advised the patient to follow-up with their primary care provider this week. I advised the patient to return  to the emergency department with new or worsening symptoms or new concerns. The patient verbalized understanding and agreement with plan.     This patient was discussed with Dr. Doy Mince who agrees with assessment and plan.    I personally performed the services described in this documentation, which was scribed in my presence. The recorded information has been reviewed and is accurate.    Waynetta Pean, PA-C 02/09/15 1547  Serita Grit, MD 02/10/15 1344

## 2015-02-09 NOTE — ED Notes (Signed)
Patient was walking and tripped. Patient twisted her ankle.

## 2015-02-09 NOTE — Discharge Instructions (Signed)
Ankle Fracture °A fracture is a break in a bone. The ankle joint is made up of three bones. These include the lower (distal) sections of your lower leg bones, called the tibia and fibula, along with a bone in your foot, called the talus. Depending on how bad the break is and if more than one ankle joint bone is broken, a cast or splint is used to protect and keep your injured bone from moving while it heals. Sometimes, surgery is required to help the fracture heal properly.  °There are two general types of fractures: °· Stable fracture. This includes a single fracture line through one bone, with no injury to ankle ligaments. A fracture of the talus that does not have any displacement (movement of the bone on either side of the fracture line) is also stable. °· Unstable fracture. This includes more than one fracture line through one or more bones in the ankle joint. It also includes fractures that have displacement of the bone on either side of the fracture line. °CAUSES °1. A direct blow to the ankle.   °2. Quickly and severely twisting your ankle. °3. Trauma, such as a car accident or falling from a significant height. °RISK FACTORS °You may be at a higher risk of ankle fracture if: °1. You have certain medical conditions. °2. You are involved in high-impact sports. °3. You are involved in a high-impact car accident. °SIGNS AND SYMPTOMS  °1. Tender and swollen ankle. °2. Bruising around the injured ankle. °3. Pain on movement of the ankle. °4. Difficulty walking or putting weight on the ankle. °5. A cold foot below the site of the ankle injury. This can occur if the blood vessels passing through your injured ankle were also damaged. °6. Numbness in the foot below the site of the ankle injury. °DIAGNOSIS  °An ankle fracture is usually diagnosed with a physical exam and X-rays. A CT scan may also be required for complex fractures. °TREATMENT  °Stable fractures are treated with a cast or splint and using crutches to  avoid putting weight on your injured ankle. This is followed by an ankle strengthening program. Some patients require a special type of cast, depending on other medical problems they may have. Unstable fractures require surgery to ensure the bones heal properly. Your health care provider will tell you what type of fracture you have and the best treatment for your condition. °HOME CARE INSTRUCTIONS  °1. Review correct crutch use with your health care provider and use your crutches as directed. Safe use of crutches is extremely important. Misuse of crutches can cause you to fall or cause injury to nerves in your hands or armpits. °2. Do not put weight or pressure on the injured ankle until directed by your health care provider. °3. To lessen the swelling, keep the injured leg elevated while sitting or lying down. °4. Apply ice to the injured area: °1. Put ice in a plastic bag. °2. Place a towel between your cast and the bag. °3. Leave the ice on for 20 minutes, 2-3 times a day. °5. If you have a plaster or fiberglass cast: °1. Do not try to scratch the skin under the cast with any objects. This can increase your risk of skin infection. °2. Check the skin around the cast every day. You may put lotion on any red or sore areas. °3. Keep your cast dry and clean. °6. If you have a plaster splint: °1. Wear the splint as directed. °2. You may loosen the elastic   around the splint if your toes become numb, tingle, or turn cold or blue. °7. Do not put pressure on any part of your cast or splint; it may break. Rest your cast only on a pillow the first 24 hours until it is fully hardened. °8. Your cast or splint can be protected during bathing with a plastic bag sealed to your skin with medical tape. Do not lower the cast or splint into water. °9. Take medicines as directed by your health care provider. Only take over-the-counter or prescription medicines for pain, discomfort, or fever as directed by your health care  provider. °10. Do not drive a vehicle until your health care provider specifically tells you it is safe to do so. °11. If your health care provider has given you a follow-up appointment, it is very important to keep that appointment. Not keeping the appointment could result in a chronic or permanent injury, pain, and disability. If you have any problem keeping the appointment, call the facility for assistance. °SEEK MEDICAL CARE IF: °You develop increased swelling or discomfort. °SEEK IMMEDIATE MEDICAL CARE IF:  °1. Your cast gets damaged or breaks. °2. You have continued severe pain. °3. You develop new pain or swelling after the cast was put on. °4. Your skin or toenails below the injury turn blue or gray. °5. Your skin or toenails below the injury feel cold, numb, or have loss of sensitivity to touch. °6. There is a bad smell or pus draining from under the cast. °MAKE SURE YOU:  °1. Understand these instructions. °2. Will watch your condition. °3. Will get help right away if you are not doing well or get worse. °Document Released: 05/10/2000 Document Revised: 05/18/2013 Document Reviewed: 12/10/2012 °[employer]® Patient Information ©2015 [employer]. This information is not intended to replace advice given to you by your health care provider. Make sure you discuss any questions you have with your health care provider. °Cast or Splint Care °Casts and splints support injured limbs and keep bones from moving while they heal. It is important to care for your cast or splint at home.   °HOME CARE INSTRUCTIONS °· Keep the cast or splint uncovered during the drying period. It can take 24 to 48 hours to dry if it is made of plaster. A fiberglass cast will dry in less than 1 hour. °· Do not rest the cast on anything harder than a pillow for the first 24 hours. °· Do not put weight on your injured limb or apply pressure to the cast until your health care provider gives you permission. °· Keep the cast or splint dry. Wet  casts or splints can lose their shape and may not support the limb as well. A wet cast that has lost its shape can also create harmful pressure on your skin when it dries. Also, wet skin can become infected. °· Cover the cast or splint with a plastic bag when bathing or when out in the rain or snow. If the cast is on the trunk of the body, take sponge baths until the cast is removed. °· If your cast does become wet, dry it with a towel or a blow dryer on the cool setting only. °· Keep your cast or splint clean. Soiled casts may be wiped with a moistened cloth. °· Do not place any hard or soft foreign objects under your cast or splint, such as cotton, toilet paper, lotion, or powder. °· Do not try to scratch the skin under the cast with any   object. The object could get stuck inside the cast. Also, scratching could lead to an infection. If itching is a problem, use a blow dryer on a cool setting to relieve discomfort. °· Do not trim or cut your cast or remove padding from inside of it. °· Exercise all joints next to the injury that are not immobilized by the cast or splint. For example, if you have a long leg cast, exercise the hip joint and toes. If you have an arm cast or splint, exercise the shoulder, elbow, thumb, and fingers. °· Elevate your injured arm or leg on 1 or 2 pillows for the first 1 to 3 days to decrease swelling and pain. It is best if you can comfortably elevate your cast so it is higher than your heart. °SEEK MEDICAL CARE IF:  °4. Your cast or splint cracks. °5. Your cast or splint is too tight or too loose. °6. You have unbearable itching inside the cast. °7. Your cast becomes wet or develops a soft spot or area. °8. You have a bad smell coming from inside your cast. °9. You get an object stuck under your cast. °10. Your skin around the cast becomes red or raw. °11. You have new pain or worsening pain after the cast has been applied. °SEEK IMMEDIATE MEDICAL CARE IF:  °4. You have fluid leaking  through the cast. °5. You are unable to move your fingers or toes. °6. You have discolored (blue or white), cool, painful, or very swollen fingers or toes beyond the cast. °7. You have tingling or numbness around the injured area. °8. You have severe pain or pressure under the cast. °9. You have any difficulty with your breathing or have shortness of breath. °10. You have chest pain. °Document Released: 05/10/2000 Document Revised: 03/03/2013 Document Reviewed: 11/19/2012 °ExitCare® Patient Information ©2015 [employer]. This information is not intended to replace advice given to you by your health care provider. Make sure you discuss any questions you have with your health care provider. °Crutch Use °Crutches are used to take weight off one of your legs or feet when you stand or walk. It is important to use crutches that fit properly. When fitted properly: °· Each crutch should be 2-3 finger widths below the armpit. °· Your weight should be supported by your hand, and not by resting the armpit on the crutch. ° °RISKS AND COMPLICATIONS °Damage to the nerves that extend from your armpit to your hand and arm. To prevent this from happening, make sure your crutches fit properly and do not put pressure on your armpit when using them. °HOW TO USE YOUR CRUTCHES °If you have been instructed to use partial weight bearing, apply (bear) the amount of weight as your health care provider suggests. Do not bear weight in an amount that causes pain to the area of injury. °Walking °12. Step with the crutches. °13. Swing the healthy leg slightly ahead of the crutches. °Going Up Steps °If there is no handrail: °11. Step up with the healthy leg. °12. Step up with the crutches and injured leg. °13. Continue in this way. °If there is a handrail: °7. Hold both crutches in one hand. °8. Place your free hand on the handrail. °9. While putting your weight on your arms, lift your healthy leg to the step. °10. Bring the crutches and the  injured leg up to that step. °11. Continue in this way. °Going Down Steps °Be very careful, as going down stairs with crutches is very   challenging. If there is no handrail: °12. Step down with the injured leg and crutches. °13. Step down with the healthy leg. °If there is a handrail: °7. Place your hand on the handrail. °8. Hold both crutches with your free hand. °9. Lower your injured leg and crutch to the step below you. Make sure to keep the crutch tips in the center of the step, never on the edge. °10. Lower your healthy leg to that step. °11. Continue in this way. °Standing Up °4. Hold the injured leg forward. °5. Grab the armrest with one hand and the top of the crutches with the other hand. °6. Using these supports, pull yourself up to a standing position. °Sitting Down °1. Hold the injured leg forward. °2. Grab the armrest with one hand and the top of the crutches with the other hand. °3. Lower yourself to a sitting position. °SEEK MEDICAL CARE IF: °· You still feel unsteady on your feet. °· You develop new pain, for example in your armpits, back, shoulder, wrist, or hip. °· You develop any numbness or tingling. °SEEK IMMEDIATE MEDICAL CARE IF: °You fall. °Document Released: 05/10/2000 Document Revised: 05/18/2013 Document Reviewed: 01/18/2013 °[employer]® Patient Information ©2015 [employer]. This information is not intended to replace advice given to you by your health care provider. Make sure you discuss any questions you have with your health care provider. ° °

## 2015-02-09 NOTE — ED Notes (Signed)
Ortho tech paged to apply splint.

## 2015-02-10 ENCOUNTER — Encounter (HOSPITAL_COMMUNITY): Payer: Self-pay | Admitting: Cardiovascular Disease

## 2015-02-10 ENCOUNTER — Encounter (HOSPITAL_COMMUNITY)
Admission: RE | Disposition: A | Discharge status: Home / Self Care | Payer: Self-pay | Source: Ambulatory Visit | Attending: Cardiovascular Disease

## 2015-02-10 DIAGNOSIS — S82841A Displaced bimalleolar fracture of right lower leg, initial encounter for closed fracture: Secondary | ICD-10-CM | POA: Diagnosis not present

## 2015-02-10 DIAGNOSIS — E1140 Type 2 diabetes mellitus with diabetic neuropathy, unspecified: Secondary | ICD-10-CM | POA: Diagnosis not present

## 2015-02-10 DIAGNOSIS — N186 End stage renal disease: Secondary | ICD-10-CM | POA: Diagnosis not present

## 2015-02-10 DIAGNOSIS — I120 Hypertensive chronic kidney disease with stage 5 chronic kidney disease or end stage renal disease: Secondary | ICD-10-CM | POA: Diagnosis not present

## 2015-02-10 DIAGNOSIS — E1129 Type 2 diabetes mellitus with other diabetic kidney complication: Secondary | ICD-10-CM | POA: Diagnosis not present

## 2015-02-10 DIAGNOSIS — Z794 Long term (current) use of insulin: Secondary | ICD-10-CM | POA: Diagnosis not present

## 2015-02-10 DIAGNOSIS — S82844A Nondisplaced bimalleolar fracture of right lower leg, initial encounter for closed fracture: Secondary | ICD-10-CM | POA: Diagnosis not present

## 2015-02-10 DIAGNOSIS — I25110 Atherosclerotic heart disease of native coronary artery with unstable angina pectoris: Secondary | ICD-10-CM | POA: Diagnosis not present

## 2015-02-10 HISTORY — PX: CARDIAC CATHETERIZATION: SHX172

## 2015-02-10 LAB — GLUCOSE, CAPILLARY: Glucose-Capillary: 214 mg/dL — ABNORMAL HIGH (ref 65–99)

## 2015-02-10 LAB — COMPREHENSIVE METABOLIC PANEL
ALBUMIN: 3.6 g/dL (ref 3.5–5.0)
ALT: 11 U/L — AB (ref 14–54)
AST: 13 U/L — AB (ref 15–41)
Alkaline Phosphatase: 47 U/L (ref 38–126)
Anion gap: 14 (ref 5–15)
BUN: 49 mg/dL — AB (ref 6–20)
CHLORIDE: 95 mmol/L — AB (ref 101–111)
CO2: 26 mmol/L (ref 22–32)
CREATININE: 8.38 mg/dL — AB (ref 0.44–1.00)
Calcium: 8.6 mg/dL — ABNORMAL LOW (ref 8.9–10.3)
GFR calc non Af Amer: 5 mL/min — ABNORMAL LOW (ref >60)
GFR, EST AFRICAN AMERICAN: 6 mL/min — AB (ref >60)
GLUCOSE: 320 mg/dL — AB (ref 65–99)
Potassium: 4.5 mmol/L (ref 3.5–5.1)
SODIUM: 135 mmol/L (ref 135–145)
Total Bilirubin: 0.6 mg/dL (ref 0.3–1.2)
Total Protein: 6.3 g/dL — ABNORMAL LOW (ref 6.5–8.1)

## 2015-02-10 LAB — RENAL FUNCTION PANEL
Albumin: 3.3 g/dL — ABNORMAL LOW (ref 3.5–5.0)
Anion gap: 12 (ref 5–15)
BUN: 49 mg/dL — ABNORMAL HIGH (ref 6–20)
CO2: 25 mmol/L (ref 22–32)
Calcium: 8.7 mg/dL — ABNORMAL LOW (ref 8.9–10.3)
Chloride: 98 mmol/L — ABNORMAL LOW (ref 101–111)
Creatinine, Ser: 7.94 mg/dL — ABNORMAL HIGH (ref 0.44–1.00)
GFR calc Af Amer: 7 mL/min — ABNORMAL LOW (ref >60)
GFR calc non Af Amer: 6 mL/min — ABNORMAL LOW (ref >60)
Glucose, Bld: 188 mg/dL — ABNORMAL HIGH (ref 65–99)
Phosphorus: 5.8 mg/dL — ABNORMAL HIGH (ref 2.5–4.6)
Potassium: 5.2 mmol/L — ABNORMAL HIGH (ref 3.5–5.1)
Sodium: 135 mmol/L (ref 135–145)

## 2015-02-10 LAB — PROTIME-INR
INR: 1.09 (ref 0.00–1.49)
Prothrombin Time: 14.3 seconds (ref 11.6–15.2)

## 2015-02-10 LAB — HCG, SERUM, QUALITATIVE: PREG SERUM: NEGATIVE

## 2015-02-10 LAB — APTT: aPTT: 32 seconds (ref 24–37)

## 2015-02-10 SURGERY — LEFT HEART CATH AND CORONARY ANGIOGRAPHY
Anesthesia: LOCAL

## 2015-02-10 MED ORDER — HEPARIN (PORCINE) IN NACL 2-0.9 UNIT/ML-% IJ SOLN: INTRAMUSCULAR | Status: DC | PRN

## 2015-02-10 MED ORDER — SODIUM CHLORIDE 0.9 % IV SOLN: 250.0000 mL | INTRAVENOUS | Status: DC | PRN

## 2015-02-10 MED ORDER — HEPARIN (PORCINE) IN NACL 2-0.9 UNIT/ML-% IJ SOLN
INTRAMUSCULAR | Status: DC | PRN
  Administered 2015-02-10: 11:00:00

## 2015-02-10 MED ORDER — SODIUM CHLORIDE 0.9 % IJ SOLN: 3.0000 mL | Freq: Two times a day (BID) | INTRAMUSCULAR | Status: DC

## 2015-02-10 MED ORDER — FENTANYL CITRATE (PF) 100 MCG/2ML IJ SOLN
INTRAMUSCULAR | Status: AC
  Filled 2015-02-10: qty 4

## 2015-02-10 MED ORDER — LIDOCAINE HCL (PF) 1 % IJ SOLN
INTRAMUSCULAR | Status: DC | PRN
  Administered 2015-02-10: 20 mL

## 2015-02-10 MED ORDER — MINOXIDIL 10 MG PO TABS
10.0000 mg | ORAL_TABLET | Freq: Two times a day (BID) | ORAL | Status: DC
  Administered 2015-02-10 – 2015-02-18 (×9): 10 mg via ORAL
  Filled 2015-02-10 (×20): qty 1

## 2015-02-10 MED ORDER — FENTANYL CITRATE (PF) 100 MCG/2ML IJ SOLN
INTRAMUSCULAR | Status: DC | PRN
  Administered 2015-02-10 (×2): 25 ug via INTRAVENOUS

## 2015-02-10 MED ORDER — SODIUM CHLORIDE 0.9 % IV SOLN
INTRAVENOUS | Status: DC
  Administered 2015-02-10: 07:00:00 via INTRAVENOUS

## 2015-02-10 MED ORDER — SODIUM CHLORIDE 0.9 % IJ SOLN
3.0000 mL | Freq: Two times a day (BID) | INTRAMUSCULAR | Status: DC
  Administered 2015-02-11 – 2015-02-17 (×6): 3 mL via INTRAVENOUS

## 2015-02-10 MED ORDER — SODIUM CHLORIDE 0.9 % IV SOLN: 100.0000 mL | INTRAVENOUS | Status: DC | PRN

## 2015-02-10 MED ORDER — INSULIN DETEMIR 100 UNIT/ML ~~LOC~~ SOLN
20.0000 [IU] | Freq: Every day | SUBCUTANEOUS | Status: DC
  Administered 2015-02-10 – 2015-02-15 (×6): 20 [IU] via SUBCUTANEOUS
  Administered 2015-02-16: 10 [IU] via SUBCUTANEOUS
  Filled 2015-02-10 (×9): qty 0.2

## 2015-02-10 MED ORDER — LIDOCAINE HCL (PF) 1 % IJ SOLN
INTRAMUSCULAR | Status: AC
  Filled 2015-02-10: qty 30

## 2015-02-10 MED ORDER — INSULIN ASPART 100 UNIT/ML ~~LOC~~ SOLN
0.0000 [IU] | Freq: Three times a day (TID) | SUBCUTANEOUS | Status: DC
  Administered 2015-02-11: 8 [IU] via SUBCUTANEOUS
  Administered 2015-02-11: 3 [IU] via SUBCUTANEOUS
  Administered 2015-02-12: 5 [IU] via SUBCUTANEOUS
  Administered 2015-02-12: 2 [IU] via SUBCUTANEOUS
  Administered 2015-02-13: 5 [IU] via SUBCUTANEOUS
  Administered 2015-02-13: 2 [IU] via SUBCUTANEOUS
  Administered 2015-02-14: 8 [IU] via SUBCUTANEOUS
  Administered 2015-02-14: 2 [IU] via SUBCUTANEOUS
  Administered 2015-02-16: 8 [IU] via SUBCUTANEOUS
  Administered 2015-02-17: 3 [IU] via SUBCUTANEOUS

## 2015-02-10 MED ORDER — INSULIN ASPART 100 UNIT/ML ~~LOC~~ SOLN: 0.0000 [IU] | Freq: Three times a day (TID) | SUBCUTANEOUS | Status: DC

## 2015-02-10 MED ORDER — LAMOTRIGINE 100 MG PO TABS
100.0000 mg | ORAL_TABLET | Freq: Every day | ORAL | Status: DC
  Administered 2015-02-10 – 2015-02-11 (×2): 100 mg via ORAL
  Filled 2015-02-10 (×3): qty 1

## 2015-02-10 MED ORDER — INSULIN ASPART PROT & ASPART (70-30 MIX) 100 UNIT/ML ~~LOC~~ SUSP
28.0000 [IU] | Freq: Every day | SUBCUTANEOUS | Status: DC
  Filled 2015-02-10: qty 10

## 2015-02-10 MED ORDER — SODIUM CHLORIDE 0.9 % IJ SOLN: 3.0000 mL | INTRAMUSCULAR | Status: DC | PRN

## 2015-02-10 MED ORDER — HEPARIN SODIUM (PORCINE) 1000 UNIT/ML DIALYSIS: 3000.0000 [IU] | Freq: Once | INTRAMUSCULAR | Status: DC

## 2015-02-10 MED ORDER — SODIUM CHLORIDE 0.9 % IJ SOLN
3.0000 mL | Freq: Two times a day (BID) | INTRAMUSCULAR | Status: DC
  Administered 2015-02-10 – 2015-02-17 (×9): 3 mL via INTRAVENOUS

## 2015-02-10 MED ORDER — HEPARIN SODIUM (PORCINE) 1000 UNIT/ML DIALYSIS: 1000.0000 [IU] | INTRAMUSCULAR | Status: DC | PRN

## 2015-02-10 MED ORDER — MIDAZOLAM HCL 2 MG/2ML IJ SOLN
INTRAMUSCULAR | Status: AC
  Filled 2015-02-10: qty 4

## 2015-02-10 MED ORDER — INSULIN ASPART PROT & ASPART (70-30 MIX) 100 UNIT/ML ~~LOC~~ SUSP
26.0000 [IU] | Freq: Every day | SUBCUTANEOUS | Status: DC
  Filled 2015-02-10: qty 10

## 2015-02-10 MED ORDER — IOHEXOL 350 MG/ML SOLN
INTRAVENOUS | Status: DC | PRN
  Administered 2015-02-10: 60 mL via INTRA_ARTERIAL

## 2015-02-10 MED ORDER — HEPARIN SODIUM (PORCINE) 5000 UNIT/ML IJ SOLN
5000.0000 [IU] | Freq: Three times a day (TID) | INTRAMUSCULAR | Status: DC
  Administered 2015-02-10 – 2015-02-18 (×20): 5000 [IU] via SUBCUTANEOUS
  Filled 2015-02-10 (×21): qty 1

## 2015-02-10 MED ORDER — INSULIN LISPRO PROT & LISPRO (50-50 MIX) 100 UNIT/ML ~~LOC~~ SUSP: 26.0000 [IU] | Freq: Every day | SUBCUTANEOUS | Status: DC

## 2015-02-10 MED ORDER — PENTAFLUOROPROP-TETRAFLUOROETH EX AERO: 1.0000 "application " | INHALATION_SPRAY | CUTANEOUS | Status: DC | PRN

## 2015-02-10 MED ORDER — INSULIN LISPRO PROT & LISPRO (50-50 MIX) 100 UNIT/ML ~~LOC~~ SUSP
28.0000 [IU] | Freq: Every day | SUBCUTANEOUS | Status: DC
  Filled 2015-02-10: qty 10

## 2015-02-10 MED ORDER — LIDOCAINE HCL (PF) 1 % IJ SOLN: 5.0000 mL | INTRAMUSCULAR | Status: DC | PRN

## 2015-02-10 MED ORDER — HYDROMORPHONE HCL 1 MG/ML IJ SOLN
INTRAMUSCULAR | Status: AC
  Filled 2015-02-10: qty 1

## 2015-02-10 MED ORDER — ISOSORBIDE MONONITRATE ER 60 MG PO TB24
60.0000 mg | ORAL_TABLET | Freq: Every day | ORAL | Status: DC
  Administered 2015-02-11 – 2015-02-18 (×6): 60 mg via ORAL
  Filled 2015-02-10 (×8): qty 1

## 2015-02-10 MED ORDER — ATORVASTATIN CALCIUM 40 MG PO TABS
40.0000 mg | ORAL_TABLET | Freq: Every evening | ORAL | Status: DC
  Administered 2015-02-11 – 2015-02-17 (×7): 40 mg via ORAL
  Filled 2015-02-10 (×7): qty 1

## 2015-02-10 MED ORDER — MIDAZOLAM HCL 2 MG/2ML IJ SOLN
INTRAMUSCULAR | Status: DC | PRN
  Administered 2015-02-10 (×2): 1 mg via INTRAVENOUS

## 2015-02-10 MED ORDER — OXYCODONE HCL 5 MG PO TABS
ORAL_TABLET | ORAL | Status: AC
  Filled 2015-02-10: qty 1

## 2015-02-10 MED ORDER — ACETAMINOPHEN 325 MG PO TABS
650.0000 mg | ORAL_TABLET | ORAL | Status: DC | PRN
  Administered 2015-02-10: 650 mg via ORAL
  Filled 2015-02-10: qty 2

## 2015-02-10 MED ORDER — SODIUM CHLORIDE 0.9 % IV SOLN
250.0000 mL | INTRAVENOUS | Status: DC | PRN
  Administered 2015-02-16: 08:00:00 via INTRAVENOUS

## 2015-02-10 MED ORDER — HEPARIN (PORCINE) IN NACL 2-0.9 UNIT/ML-% IJ SOLN
INTRAMUSCULAR | Status: AC
  Filled 2015-02-10: qty 1500

## 2015-02-10 MED ORDER — LIDOCAINE-PRILOCAINE 2.5-2.5 % EX CREA
1.0000 "application " | TOPICAL_CREAM | CUTANEOUS | Status: DC | PRN
  Filled 2015-02-10: qty 5

## 2015-02-10 MED ORDER — ALTEPLASE 2 MG IJ SOLR
2.0000 mg | Freq: Once | INTRAMUSCULAR | Status: DC | PRN
  Filled 2015-02-10: qty 2

## 2015-02-10 SURGICAL SUPPLY — 7 items
CATH INFINITI 5FR MULTPACK ANG (CATHETERS) ×2 IMPLANT
KIT HEART LEFT (KITS) ×2 IMPLANT
PACK CARDIAC CATHETERIZATION (CUSTOM PROCEDURE TRAY) ×2 IMPLANT
SHEATH PINNACLE 5F 10CM (SHEATH) ×2 IMPLANT
SYR MEDRAD MARK V 150ML (SYRINGE) ×2 IMPLANT
TRANSDUCER W/STOPCOCK (MISCELLANEOUS) ×2 IMPLANT
WIRE EMERALD 3MM-J .035X150CM (WIRE) ×2 IMPLANT

## 2015-02-10 NOTE — Consult Note (Signed)
[facility] Renal Consultation Note    Indication for Consultation:  Management of ESRD/hemodialysis; anemia, hypertension/volume and secondary hyperparathyroidism PCP: Dr. Merrilee Seashore  HPI: [name] is a 39 y.o. female with ESRD who has hemodialysis MWF at [facility]. 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 On HD since 2012.   Was admitted to [facility] for cardiac catherization as pre op work up for orthopedic surgery Monday per Dr. Mayer Camel. Patient denies chest pain, SOB, fever, chills, NVD, abdominal pain. S/P bimalleolar fracture of right ankle.  States she is in pain from her foot. Seen post cardiac cath. Results as follows: Ost Cx to Prox Cx lesion, 35% stenosed. Ost LAD to Prox LAD lesion, 20% stenosed. Prox Cx to Mid Cx lesion, 25% stenosed. Prox RCA lesion, 20% stenosed. RPDA lesion, 50% stenosed. Will have HD following cardiac cath.  Patient attends [facility]. Regular attendance, occassional high gains between treatments. Last in center lab values as follows: 02/08/15 HGB 10.3,  02/03/15 Ca 9.9,  Phos 6.6 01/18/15 PTH 238. Has functioning permanent access.   Past Medical History  Diagnosis Date  . DM (diabetes mellitus)     Long-term insulin  . Hypertension   . Tobacco use disorder     Discontinued March 2012  . Hyperlipidemia     Hypertriglyceridemia 449 HDL 25  . Diabetic Charcot's joint disease   . Coronary artery disease     Status post cardiac catheterization June 2012 scattered coronary artery disease/atherosclerosis with 70-80% stenosis in a small right PDA.  . Gastroparesis   . Peripheral vascular disease     Tibial occlusive disease evaluated by Dr. Kellie Simmering in August 2011. Medical therapy  . 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 CIN III (cervical intraepithelial neoplasia  grade III) with severe dysplasia     S/P LEEP AND CONE  . Chronic anemia     2nd to renal disease  . End stage renal disease on dialysis 05/02/11    [facility]; M; W, F; last time 05/01/11  . Migraines     "just on dialysis days"  . Peripheral neuropathy     related to DM  . History of abscesses in groins 12/06/2010  . Renal insufficiency     Dialysis since 2012   Past Surgical History  Procedure Laterality Date  . Av fistula placement  04/2010  . Dilation and curettage of uterus  2009  . Cervical biopsy  w/ loop electrode excision      h/o  . Cervical cone biopsy      h/o  . Colposcopy    . Cardiac catheterization [name] 02/10/2015    Procedure: Left Heart Cath and Coronary Angiography;  Surgeon: Dixie Dials, MD;  Location: [place] [facility];  Service: Cardiovascular;  Laterality: [name];   Family History  Problem Relation Age of Onset  . Diabetes Mother   . Hypertension Mother   . Diabetes Father   . Hyperlipidemia Father   . Hypertension Father   . Diabetes Sister   . Breast cancer Maternal Aunt     Age 20's  . Stroke Maternal Grandmother   . Diabetes Sister    Social History:  reports that she quit smoking about 3 years ago. Her smoking use included Cigarettes. She has a 3 pack-year smoking history. She has never used smokeless tobacco. She reports that she  does not drink alcohol or use illicit drugs. Allergies  Allergen Reactions  . Cephalexin Other (See Comments)    Reaction unknown  . Sulfamethoxazole-Trimethoprim Other (See Comments)    Thrush   Prior to Admission medications   Medication Sig Start Date End Date Taking? Authorizing Provider  amLODipine (NORVASC) 10 MG tablet Take 1 tablet (10 mg total) by mouth daily. 07/05/13  Yes Minus Breeding, MD  atorvastatin (LIPITOR) 40 MG tablet Take 40 mg by mouth daily.   Yes Historical Provider, MD  cinacalcet (SENSIPAR) 30 MG tablet Take 30 mg by mouth daily.   Yes Historical Provider, MD  cloNIDine (CATAPRES) 0.3 MG  tablet Take 1 tablet (0.3 mg total) by mouth 3 (three) times daily. 11/01/14  Yes Debbe Odea, MD  clopidogrel (PLAVIX) 75 MG tablet Take 75 mg by mouth at bedtime.    Yes Historical Provider, MD  HUMALOG MIX 50/50 KWIKPEN (50-50) 100 UNIT/ML Kwikpen Inject 26-28 Units into the skin 2 (two) times daily. 28 in the morning and 26 in the evening. 04/29/14  Yes Historical Provider, MD  hydrALAZINE (APRESOLINE) 50 MG tablet Take 1 tablet (50 mg total) by mouth every 8 (eight) hours. 11/01/14  Yes Debbe Odea, MD  HYDROcodone-acetaminophen (NORCO/VICODIN) 5-325 MG per tablet Take 1-2 tablets by mouth every 4 (four) hours as needed for moderate pain or severe pain. 02/09/15  Yes Waynetta Pean, PA-C  isosorbide mononitrate (IMDUR) 60 MG 24 hr tablet Take 60 mg by mouth daily.   Yes Historical Provider, MD  labetalol (NORMODYNE) 300 MG tablet Take 1 tablet (300 mg total) by mouth 3 (three) times daily. 11/01/14  Yes Debbe Odea, MD  lamoTRIgine (LAMICTAL) 25 MG tablet Take 100 mg by mouth at bedtime.   Yes Historical Provider, MD  minoxidil (LONITEN) 10 MG tablet Take 10 mg by mouth 2 (two) times daily.   Yes Historical Provider, MD  nitroGLYCERIN (NITROSTAT) 0.4 MG SL tablet Place 0.4 mg under the tongue every 5 (five) minutes as needed. For chest pain   Yes Historical Provider, MD  RENVELA 800 MG tablet Take 1 tablet by mouth 3 (three) times daily with meals.  04/17/13  Yes Historical Provider, MD  atorvastatin (LIPITOR) 80 MG tablet Take 1 tablet (80 mg total) by mouth every evening. 02/06/12   Donney Dice, PA-C  chlorpheniramine-HYDROcodone (TUSSIONEX PENNKINETIC ER) 10-8 MG/5ML SUER Take 5 mLs by mouth 2 (two) times daily. Patient not taking: Reported on 02/10/2015 10/29/14   Roselee Culver, MD  ondansetron (ZOFRAN) 4 MG tablet Take 1 tablet (4 mg total) by mouth every 8 (eight) hours as needed for nausea or vomiting. Patient not taking: Reported on 02/10/2015 05/24/14   Clayton Bibles, PA-C   Current  Facility-Administered Medications  Medication Dose Route Frequency Provider Last Rate Last Dose  . 0.9 %  sodium chloride infusion  250 mL Intravenous PRN Dixie Dials, MD      . 0.9 %  sodium chloride infusion  250 mL Intravenous PRN Dixie Dials, MD      . acetaminophen (TYLENOL) tablet 650 mg  650 mg Oral Q4H PRN Dixie Dials, MD      . amLODipine (NORVASC) tablet 10 mg  10 mg Oral Daily Dixie Dials, MD      . aspirin EC tablet 81 mg  81 mg Oral Daily Dixie Dials, MD   81 mg at 02/10/15 0643  . atorvastatin (LIPITOR) tablet 80 mg  80 mg Oral QPM Dixie Dials, MD   80  mg at 02/09/15 2256  . cinacalcet (SENSIPAR) tablet 30 mg  30 mg Oral Q breakfast Dixie Dials, MD   30 mg at 02/10/15 0800  . cloNIDine (CATAPRES) tablet 0.3 mg  0.3 mg Oral 3 times per day Dixie Dials, MD   0.3 mg at 02/10/15 1700  . clopidogrel (PLAVIX) tablet 75 mg  75 mg Oral Q breakfast Dixie Dials, MD   75 mg at 02/10/15 1749  . docusate sodium (COLACE) capsule 100 mg  100 mg Oral BID Dixie Dials, MD   100 mg at 02/09/15 2336  . gabapentin (NEURONTIN) capsule 300 mg  300 mg Oral QPM Dixie Dials, MD   300 mg at 02/09/15 2257  . heparin injection 5,000 Units  5,000 Units Subcutaneous 3 times per day Dixie Dials, MD      . hydrALAZINE (APRESOLINE) tablet 50 mg  50 mg Oral 3 times per day Dixie Dials, MD   50 mg at 02/10/15 0643  . HYDROmorphone (DILAUDID) injection 1 mg  1 mg Intravenous Q4H PRN Dixie Dials, MD   1 mg at 02/10/15 0903  . isosorbide mononitrate (IMDUR) 24 hr tablet 120 mg  120 mg Oral Daily Dixie Dials, MD      . labetalol (NORMODYNE) tablet 300 mg  300 mg Oral 3 times per day Dixie Dials, MD   300 mg at 02/10/15 4496  . nitroGLYCERIN (NITROSTAT) SL tablet 0.4 mg  0.4 mg Sublingual Q5 min PRN Dixie Dials, MD      . ondansetron (ZOFRAN) tablet 4 mg  4 mg Oral Q6H PRN Dixie Dials, MD       Or  . ondansetron (ZOFRAN) injection 4 mg  4 mg Intravenous Q6H PRN Dixie Dials, MD      . oxyCODONE (Oxy  IR/ROXICODONE) immediate release tablet 5 mg  5 mg Oral Q4H PRN Dixie Dials, MD   5 mg at 02/10/15 1128  . sevelamer carbonate (RENVELA) tablet 800 mg  800 mg Oral TID AC & HS Dixie Dials, MD   800 mg at 02/09/15 2258  . sodium chloride 0.9 % injection 3 mL  3 mL Intravenous Q12H Dixie Dials, MD   3 mL at 02/09/15 2259  . sodium chloride 0.9 % injection 3 mL  3 mL Intravenous Q12H Dixie Dials, MD      . sodium chloride 0.9 % injection 3 mL  3 mL Intravenous PRN Dixie Dials, MD      . sodium chloride 0.9 % injection 3 mL  3 mL Intravenous Q12H Dixie Dials, MD      . sodium chloride 0.9 % injection 3 mL  3 mL Intravenous PRN Dixie Dials, MD       Labs: Basic Metabolic Panel:  Recent Labs Lab 02/09/15 2337  NA 135  K 4.5  CL 95*  CO2 26  GLUCOSE 320*  BUN 49*  CREATININE 8.38*  CALCIUM 8.6*   Liver Function Tests:  Recent Labs Lab 02/09/15 2337  AST 13*  ALT 11*  ALKPHOS 47  BILITOT 0.6  PROT 6.3*  ALBUMIN 3.6   No results for input(s): LIPASE, AMYLASE in the last 168 hours. No results for input(s): AMMONIA in the last 168 hours. CBC:  Recent Labs Lab 02/09/15 2337  WBC 9.6  NEUTROABS 6.9  HGB 10.4*  HCT 32.2*  MCV 97.3  PLT 141*   Cardiac Enzymes: No results for input(s): CKTOTAL, CKMB, CKMBINDEX, TROPONINI in the last 168 hours. CBG:  Recent Labs Lab 02/10/15 1129  GLUCAP 214*   Iron Studies: No results for input(s): IRON, TIBC, TRANSFERRIN, FERRITIN in the last 72 hours. Studies/Results: X-ray Chest Pa And Lateral  02/10/2015   CLINICAL DATA:  Coronary artery disease.  EXAM: CHEST  2 VIEW  COMPARISON:  11/11/2014  FINDINGS: Progressive cardiomegaly compared to prior exam. Mediastinal contours are unchanged. Minimal vascular congestion. There is no pulmonary edema, confluent airspace disease, pleural effusion or pneumothorax. No acute osseous abnormalities are seen pre  IMPRESSION: Progressive cardiomegaly.  Mild vascular congestion.   Electronically  Signed   By: Jeb Levering M.D.   On: 02/10/2015 03:16   Dg Ankle Complete Right  02/09/2015   CLINICAL DATA:  Pain following fall in parking lot earlier today  EXAM: RIGHT ANKLE - COMPLETE 3+ VIEW  COMPARISON:  April 10, 2007  FINDINGS: Frontal, oblique, and lateral views obtained. There is a comminuted fracture of the medial aspect of the distal tibial metaphysis with several mildly displaced fracture fragments. There is a fracture of the posterior aspect of the distal tibial metaphysis with alignment essentially anatomic. There is a comminuted fracture of the lateral malleolus with several distal fracture fragments displaced inferiorly. There is marked soft tissue swelling laterally. There is a small joint effusion. The ankle mortise appears disrupted on the oblique view. There are foci of arterial vascular calcification.  IMPRESSION: Bimalleolar type fracture with displaced fractures medially and laterally with a nondisplaced fracture of the posterior distal tibial metaphysis. There is ankle mortise disruption. There is marked soft tissue swelling laterally with joint effusion. There are multiple foci of arterial vascular calcification.   Electronically Signed   By: Lowella Grip III M.D.   On: 02/09/2015 14:24    ROS: As per HPI otherwise negative.   Physical Exam: Filed Vitals:   02/10/15 1130 02/10/15 1135 02/10/15 1140 02/10/15 1145  BP: 162/74 158/69 161/65 159/71  Pulse: 73 74 73 76  Temp:      TempSrc:      Resp: 16 14 12 11   Height:      Weight:      SpO2: 98% 96% 96% 94%     General: Well developed, well nourished, in moderate pain from R bimaleolar fracture RLE.  Head: Normocephalic, atraumatic, sclera non-icteric, mucus membranes are moist Neck: Supple. JVD not elevated. Lungs: Clear bilaterally to auscultation without wheezes, rales, or rhonchi. Breathing is unlabored. Heart: RRR with S1 S2. II/VI systolic M. No rubs, or gallops appreciated. Abdomen: Soft,  non-tender, non-distended with normoactive bowel sounds. No rebound/guarding. No obvious abdominal masses. M-S:  Strength and tone appear normal for age. Lower extremities:Has walking cast RLE for bimalleolar fracture of right ankle. No edema. 2+ pulses. Neuro: Alert and oriented X 3. Moves all extremities spontaneously.  Psych:  Responds to questions appropriately.  Slightly lethargic from pain meds. Dialysis Access: LFA AVF +thrll + bruit.  Dialysis Orders: Center: [facility] on MWF . EDW 105 kg HD Bath 2.0K 2.0 Ca  Time 4 hours Heparin 3000 units per tx. Access LFA AVF BFR 450 DFR Auto 1.5  Venofer 50 mg IV weekly (last dose 02/03/15)  Mircera 75 mcg IV q 2 weeks (last dose 02/01/15)  Assessment/Plan: 1.  CAD/Cardiac Catheterization:  Per Dr. Doylene Canard for pre op clearance. Having repair of bimaleolar fracture Monday.  2.  ESRD -  MWF at [facility]. For HD today. 3.  Hypertension/volume  - BP elevated. Will attempt 3-4 liters in HD today. On hydralazine, labetolol, catapres, amlodipine and Imdur. 4.  Anemia  -  HGB 10.4. Will follow CBC 5.  Metabolic bone disease -  Ca 8.6, C Ca 8.92 Continue Sensipar and binders. 6.  Nutrition - Clear liquids now. Advance to renal diet when appropriate.  7.  DM: Per primary  Janet H. Owens Shark, NP-C 02/10/2015, 12:02 PM  [employer] [phone]  Renal Attending: I agree with the note articulated above and I have independently evaluated this patient. POWELL,ALVIN C

## 2015-02-10 NOTE — Progress Notes (Signed)
Patient experiencing pain which has not been resolved with medication. Patient has requested to terminate tx early. Patient has successfully removed 2.6kg of the 3kg goal ordered. Dr Florene Glen has been made aware of the patient's decision to terminate early.

## 2015-02-10 NOTE — Consult Note (Addendum)
Reason for Consult:Right ankle bimalleolar fracture Referring Physician: EDP  [name] is an 39 y.o. female.  HPI: Patient fell walking across a parking lot and sustained a right ankle bimalleolar fracture with a a partial fracture of the tip of the lateral malleolus and a minimally displaced to nondisplaced fracture of the medial more little is starting at the shoulder of the tibial plafond going diagonally.  She was seen at [facility] long emergency room, placed in a posterior splint and reports significant pain.  Pain as a throbbing quality to it.  The patient is 39 years old.  Her medical comorbidities are significant with dialysis since 2012 secondary to type 1 diabetes that she's had for the last 13 years her sugars normally run 150-200 her A1c is 7.1.  She was admitted to the hospital in June of 2016, or weakness and dizziness and her cardiac evaluation with Dr. Gaynelle Arabian included a recommendation for a cardiac catheterization that was canceled and is now scheduled for 02/21/2015.  Prior to any surgery on her ankle this catheterization.  We'll have to be accomplished.  I did contact Dr. Doylene Canard, Who is admitted the patient for cardiac catheterization.  This will be followed by dialysis, and hopefully she will be cleared for open reduction internal fixation on Monday with Dr. Altamese [place] who is assuming her orthopedic care.   Past Medical History  Diagnosis Date  . DM (diabetes mellitus)     Long-term insulin  . Hypertension   . Tobacco use disorder     Discontinued March 2012  . Hyperlipidemia     Hypertriglyceridemia 449 HDL 25  . Diabetic Charcot's joint disease   . Coronary artery disease     Status post cardiac catheterization June 2012 scattered coronary artery disease/atherosclerosis with 70-80% stenosis in a small right PDA.  . Gastroparesis   . Peripheral vascular disease     Tibial occlusive disease evaluated by Dr. Kellie Simmering in August 2011. Medical therapy  . Hemophilia A  carrier   . CIN III (cervical intraepithelial neoplasia grade III) with severe dysplasia     S/P LEEP AND CONE  . Chronic anemia     2nd to renal disease  . End stage renal disease on dialysis 05/02/11    [facility]; M; W, F; last time 05/01/11  . Migraines     "just on dialysis days"  . Peripheral neuropathy     related to DM  . History of abscesses in groins 12/06/2010  . Renal insufficiency     Dialysis since 2012    Past Surgical History  Procedure Laterality Date  . Av fistula placement  04/2010  . Dilation and curettage of uterus  2009  . Cervical biopsy  w/ loop electrode excision      h/o  . Cervical cone biopsy      h/o  . Colposcopy    . Cardiac catheterization [name] 02/10/2015    Procedure: Left Heart Cath and Coronary Angiography;  Surgeon: Dixie Dials, MD;  Location: [place] [facility];  Service: Cardiovascular;  Laterality: [name];    Family History  Problem Relation Age of Onset  . Diabetes Mother   . Hypertension Mother   . Diabetes Father   . Hyperlipidemia Father   . Hypertension Father   . Diabetes Sister   . Breast cancer Maternal Aunt     Age 64's  . Stroke Maternal Grandmother   . Diabetes Sister     Social History:  reports that  she quit smoking about 3 years ago. Her smoking use included Cigarettes. She has a 3 pack-year smoking history. She has never used smokeless tobacco. She reports that she does not drink alcohol or use illicit drugs.  Allergies:  Allergies  Allergen Reactions  . Cephalexin Other (See Comments)    Reaction unknown  . Sulfamethoxazole-Trimethoprim Other (See Comments)    Thrush    Medications: I have reviewed the patient's current medications.  Results for orders placed or performed during the hospital encounter of 02/09/15 (from the past 48 hour(s))  Comprehensive metabolic panel     Status: Abnormal   Collection Time: 02/09/15 11:37 PM  Result Value Ref Range   Sodium 135 135 - 145 mmol/L   Potassium 4.5 3.5 - 5.1  mmol/L   Chloride 95 (L) 101 - 111 mmol/L   CO2 26 22 - 32 mmol/L   Glucose, Bld 320 (H) 65 - 99 mg/dL   BUN 49 (H) 6 - 20 mg/dL   Creatinine, Ser 8.38 (H) 0.44 - 1.00 mg/dL   Calcium 8.6 (L) 8.9 - 10.3 mg/dL   Total Protein 6.3 (L) 6.5 - 8.1 g/dL   Albumin 3.6 3.5 - 5.0 g/dL   AST 13 (L) 15 - 41 U/L   ALT 11 (L) 14 - 54 U/L   Alkaline Phosphatase 47 38 - 126 U/L   Total Bilirubin 0.6 0.3 - 1.2 mg/dL   GFR calc non Af Amer 5 (L) >60 mL/min   GFR calc Af Amer 6 (L) >60 mL/min    Comment: (NOTE) The eGFR has been calculated using the CKD EPI equation. This calculation has not been validated in all clinical situations. eGFR's persistently <60 mL/min signify possible Chronic Kidney Disease.    Anion gap 14 5 - 15  CBC WITH DIFFERENTIAL     Status: Abnormal   Collection Time: 02/09/15 11:37 PM  Result Value Ref Range   WBC 9.6 4.0 - 10.5 K/uL   RBC 3.31 (L) 3.87 - 5.11 MIL/uL   Hemoglobin 10.4 (L) 12.0 - 15.0 g/dL   HCT 32.2 (L) 36.0 - 46.0 %   MCV 97.3 78.0 - 100.0 fL   MCH 31.4 26.0 - 34.0 pg   MCHC 32.3 30.0 - 36.0 g/dL   RDW 15.9 (H) 11.5 - 15.5 %   Platelets 141 (L) 150 - 400 K/uL   Neutrophils Relative % 73 %   Neutro Abs 6.9 1.7 - 7.7 K/uL   Lymphocytes Relative 18 %   Lymphs Abs 1.7 0.7 - 4.0 K/uL   Monocytes Relative 8 %   Monocytes Absolute 0.8 0.1 - 1.0 K/uL   Eosinophils Relative 1 %   Eosinophils Absolute 0.1 0.0 - 0.7 K/uL   Basophils Relative 0 %   Basophils Absolute 0.0 0.0 - 0.1 K/uL  Protime-INR     Status: None   Collection Time: 02/09/15 11:37 PM  Result Value Ref Range   Prothrombin Time 14.3 11.6 - 15.2 seconds   INR 1.09 0.00 - 1.49  APTT     Status: None   Collection Time: 02/09/15 11:37 PM  Result Value Ref Range   aPTT 32 24 - 37 seconds  hCG, serum, qualitative     Status: None   Collection Time: 02/10/15  4:20 AM  Result Value Ref Range   Preg, Serum NEGATIVE NEGATIVE    Comment:        THE SENSITIVITY OF THIS METHODOLOGY IS >10  mIU/mL.   Glucose, capillary  Status: Abnormal   Collection Time: 02/10/15 11:29 AM  Result Value Ref Range   Glucose-Capillary 214 (H) 65 - 99 mg/dL    X-ray Chest Pa And Lateral  02/10/2015   CLINICAL DATA:  Coronary artery disease.  EXAM: CHEST  2 VIEW  COMPARISON:  11/11/2014  FINDINGS: Progressive cardiomegaly compared to prior exam. Mediastinal contours are unchanged. Minimal vascular congestion. There is no pulmonary edema, confluent airspace disease, pleural effusion or pneumothorax. No acute osseous abnormalities are seen pre  IMPRESSION: Progressive cardiomegaly.  Mild vascular congestion.   Electronically Signed   By: Jeb Levering M.D.   On: 02/10/2015 03:16   Dg Ankle Complete Right  02/09/2015   CLINICAL DATA:  Pain following fall in parking lot earlier today  EXAM: RIGHT ANKLE - COMPLETE 3+ VIEW  COMPARISON:  April 10, 2007  FINDINGS: Frontal, oblique, and lateral views obtained. There is a comminuted fracture of the medial aspect of the distal tibial metaphysis with several mildly displaced fracture fragments. There is a fracture of the posterior aspect of the distal tibial metaphysis with alignment essentially anatomic. There is a comminuted fracture of the lateral malleolus with several distal fracture fragments displaced inferiorly. There is marked soft tissue swelling laterally. There is a small joint effusion. The ankle mortise appears disrupted on the oblique view. There are foci of arterial vascular calcification.  IMPRESSION: Bimalleolar type fracture with displaced fractures medially and laterally with a nondisplaced fracture of the posterior distal tibial metaphysis. There is ankle mortise disruption. There is marked soft tissue swelling laterally with joint effusion. There are multiple foci of arterial vascular calcification.   Electronically Signed   By: Lowella Grip III M.D.   On: 02/09/2015 14:24    ROS Blood pressure 158/71, pulse 78, temperature 98.2 F  (36.8 C), temperature source Oral, resp. rate 16, height '6\' 3"'  (1.905 m), weight 111.7 kg (246 lb 4.1 oz), last menstrual period 01/09/2015, SpO2 96 %. Physical Exam: Well-nourished well-developed patient seated on the exam table in no obvious distress no shortness of breath.  The patient's splint is removed the skin is intact there is no blistering the swelling is 2+.  There is moderate ecchymosis of the scanShe is tender with medially and laterally.  She moves her toes up and down without difficulty.  She does have subjective numbness to the foot pads of both feet.  Her x-rays from [facility] long emergency room are reviewed showing fractures as described.  On the x-ray.  He can also see the patient's arterial tree and her bone does appear to be osteopenic.   Assess: Minimally displaced bimalleolar fracture right ankle the fracture of the lateral malleolus involves only the inner cortex of the tip and would not benefit from fixation on the medial side it is a large medial malleolar fragment nondisplaced starting at the shoulder of the plafond and going diagonally and proximally.  Plan: The patient is placed in a Cam Walker boot and we'll keep her leg elevated and iced down.  This evening.  The goal is to minimize her swelling.  She will be n.p.o. after midnight and undergo her Cardiac catheterization with Dr. Doylene Canard followedby normal dialysis tomorrow.  I have discussed her case with Dr. Legrand Como handy orthopedic traumatologist, he would like her to stay in the hospital over the weekend and plans on open reduction internal fixation of the right ankle on Monday after the swelling has gone down and her skin looks a little bit better.  Kerin Salen  02/10/2015, 2:29 PM

## 2015-02-10 NOTE — Interval H&P Note (Signed)
History and Physical Interval Note:  02/10/2015 10:30 AM  [name]  has presented today for surgery, with the diagnosis of CAD  The various methods of treatment have been discussed with the patient and family. After consideration of risks, benefits and other options for treatment, the patient has consented to  Procedure(s): Left Heart Cath and Coronary Angiography ([name]) as a surgical intervention .  The patient's history has been reviewed, patient examined, no change in status, stable for surgery.  I have reviewed the patient's chart and labs.  Questions were answered to the patient's satisfaction.     KADAKIA,AJAY S

## 2015-02-10 NOTE — Progress Notes (Signed)
Site area:rght groin a 5 french arterial sheath was removed  Site Prior to Removal:  Level 0  Pressure Applied For 15 MINUTES    Minutes Beginning at 1130am,  Manual:   Yes.    Patient Status During Pull:  stable  Post Pull Groin Site:  Level 0  Post Pull Instructions Given:  Yes.    Post Pull Pulses Present:  Yes.    Dressing Applied:  Yes.    Comments:  VS remain Remain stable during sheath stable during sheath pull.  Pt havind 10 out of 10 pain with her fx ankle and is pending for surgery.

## 2015-02-10 NOTE — Progress Notes (Signed)
Inpatient Diabetes Program Recommendations  AACE/ADA: New Consensus Statement on Inpatient Glycemic Control (2015)  Target Ranges:  Prepandial:   less than 140 mg/dL      Peak postprandial:   less than 180 mg/dL (1-2 hours)      Critically ill patients:  140 - 180 mg/dL    Results for [name], [name] (MRN [id number]) as of 02/10/2015 08:49  Ref. Range 02/09/2015 23:37  Glucose Latest Ref Range: 65-99 mg/dL 320 (H)     Admit with: SOB  History: DM, CAD, ESRD  Home DM Meds: Humalog 50/50 insulin- 28 units AM/ 26 units PM  Current DM Orders: None     -Patient scheduled for cardiac cath this AM.  Currently NPO.  -Glucose around midnight last PM was 320 mg/dl.    MD- Please consider the following in-hospital insulin recommendations:  1. Start Novolog Moderate SSI (0-15 units) TID AC + HS  2. Start Levemir 20 units daily   (to cover patient's basal insulin needs- note patient takes Humalog 50/50 insulin at home, however, this insulin is not ideal in the hospital setting as patient is NPO today and will likely be NPO again for ankle surgery.  This dose would be about 80% of the long-acting insulin she gets in both of her doses of 50/50 insulin)    Will follow Wyn Quaker RN, MSN, CDE Diabetes Coordinator Inpatient Glycemic Control Team Team Pager: [phone] (8a-5p)

## 2015-02-11 ENCOUNTER — Encounter (HOSPITAL_COMMUNITY): Payer: Self-pay | Admitting: *Deleted

## 2015-02-11 DIAGNOSIS — E1122 Type 2 diabetes mellitus with diabetic chronic kidney disease: Secondary | ICD-10-CM | POA: Diagnosis not present

## 2015-02-11 DIAGNOSIS — E1140 Type 2 diabetes mellitus with diabetic neuropathy, unspecified: Secondary | ICD-10-CM | POA: Diagnosis not present

## 2015-02-11 DIAGNOSIS — N2581 Secondary hyperparathyroidism of renal origin: Secondary | ICD-10-CM | POA: Diagnosis not present

## 2015-02-11 DIAGNOSIS — Z794 Long term (current) use of insulin: Secondary | ICD-10-CM | POA: Diagnosis not present

## 2015-02-11 DIAGNOSIS — S82844A Nondisplaced bimalleolar fracture of right lower leg, initial encounter for closed fracture: Secondary | ICD-10-CM | POA: Diagnosis not present

## 2015-02-11 DIAGNOSIS — E1129 Type 2 diabetes mellitus with other diabetic kidney complication: Secondary | ICD-10-CM | POA: Diagnosis not present

## 2015-02-11 DIAGNOSIS — N186 End stage renal disease: Secondary | ICD-10-CM | POA: Diagnosis not present

## 2015-02-11 DIAGNOSIS — I25110 Atherosclerotic heart disease of native coronary artery with unstable angina pectoris: Secondary | ICD-10-CM | POA: Diagnosis not present

## 2015-02-11 DIAGNOSIS — S82851A Displaced trimalleolar fracture of right lower leg, initial encounter for closed fracture: Secondary | ICD-10-CM | POA: Diagnosis not present

## 2015-02-11 DIAGNOSIS — I120 Hypertensive chronic kidney disease with stage 5 chronic kidney disease or end stage renal disease: Secondary | ICD-10-CM | POA: Diagnosis not present

## 2015-02-11 LAB — GLUCOSE, CAPILLARY
GLUCOSE-CAPILLARY: 183 mg/dL — AB (ref 65–99)
GLUCOSE-CAPILLARY: 151 mg/dL — AB (ref 65–99)
Glucose-Capillary: 276 mg/dL — ABNORMAL HIGH (ref 65–99)
Glucose-Capillary: 215 mg/dL — ABNORMAL HIGH (ref 65–99)

## 2015-02-11 LAB — CBC
HEMATOCRIT: 33.5 % — AB (ref 36.0–46.0)
HEMOGLOBIN: 10.5 g/dL — AB (ref 12.0–15.0)
MCH: 30.3 pg (ref 26.0–34.0)
MCHC: 31.3 g/dL (ref 30.0–36.0)
MCV: 96.8 fL (ref 78.0–100.0)
Platelets: 123 10*3/uL — ABNORMAL LOW (ref 150–400)
RBC: 3.46 MIL/uL — ABNORMAL LOW (ref 3.87–5.11)
RDW: 15.9 % — ABNORMAL HIGH (ref 11.5–15.5)
WBC: 6.1 10*3/uL (ref 4.0–10.5)

## 2015-02-11 LAB — BASIC METABOLIC PANEL
ANION GAP: 12 (ref 5–15)
BUN: 40 mg/dL — ABNORMAL HIGH (ref 6–20)
CALCIUM: 9 mg/dL (ref 8.9–10.3)
CHLORIDE: 95 mmol/L — AB (ref 101–111)
CO2: 27 mmol/L (ref 22–32)
CREATININE: 7.35 mg/dL — AB (ref 0.44–1.00)
GFR calc non Af Amer: 6 mL/min — ABNORMAL LOW (ref >60)
GFR, EST AFRICAN AMERICAN: 7 mL/min — AB (ref >60)
Glucose, Bld: 161 mg/dL — ABNORMAL HIGH (ref 65–99)
Potassium: 4.4 mmol/L (ref 3.5–5.1)
SODIUM: 134 mmol/L — AB (ref 135–145)

## 2015-02-11 NOTE — Progress Notes (Signed)
[facility] Progress Note  Assessment/Plan: 1. Fx S/P bimalleolar fracture of right ankle. For open reduction internal fixation on Monday with Dr. Altamese [place].  2. CAD/Cardiac Catheterization: Per Dr. Doylene Canard for pre op clearance. Having repair of bimaleolar fracture Monday.  3. ESRD - MWF at [facility]. For HD Monday. Will coordinate with OR schedule. 4. Hypertension/volume - HD yesterday. Net UF 2603. Post Wt. 108.4 BP still elevated. Will have HD Monday after OR. On hydralazine, labetolol, catapres, amlodipine and Imdur. 5. Anemia - HGB 10.5 Will follow CBC 6. Metabolic bone disease - Ca 8.6, [name] Ca 8.92 Continue Sensipar and binders. 7. Nutrition - Clear liquids now. Advance to renal diet/carb mod today.  8. DM: Per primary 9.  Rita H. Brown NP-[name] 02/11/2015, 11:34 AM  [facility] [phone]  Renal Attending: Stable post HD yesterday.  Will plan for Monday.  Will defer to surgery schedule.  Agree with note above. [name],[name]   Subjective:  "I was doing all right then my foot started hurting". Lying in bed with R foot elevated on pillow.   Objective Filed Vitals:   02/10/15 2004 02/10/15 2005 02/10/15 2205 02/11/15 0554  BP: 179/107  165/78 140/71  Pulse:  99 78 79  Temp:   100.7 F (38.2 [name]) 98.1 F (36.7 [name])  TempSrc:   Oral Oral  Resp:    16  Height:      Weight:      SpO2:   97% 98%   Physical Exam General: well nourished, NAD Heart: RRR with S1 S2. II/VI systolic M. No rubs, or gallops appreciated. SR on monitor. HR 78. Lungs: Bilateral breath sounds CTA. Abdomen: abdomen soft, non-tender. Extremities:Walkin cast in place/posterior splint RLE. No edema. 2+ pulses.  Dialysis Access: LFA AVF +thrll + bruit.  Dialysis Orders: Center: [facility] on MWF . EDW 105 kg HD [place] 2.0K 2.0 Ca Time 4 hours Heparin 3000 units per tx. Access LFA AVF BFR 450 DFR Auto 1.5  Venofer 50 mg IV weekly (last dose 02/03/15)  Mircera 75 mcg IV q 2  weeks (last dose 02/01/15)   Additional Objective Labs: Basic Metabolic Panel:  Recent Labs Lab 02/09/15 2337 02/10/15 1417 02/11/15 0741  NA 135 135 134*  K 4.5 5.2* 4.4  CL 95* 98* 95*  CO2 26 25 27   GLUCOSE 320* 188* 161*  BUN 49* 49* 40*  CREATININE 8.38* 7.94* 7.35*  CALCIUM 8.6* 8.7* 9.0  PHOS  --  5.8*  --    Liver Function Tests:  Recent Labs Lab 02/09/15 2337 02/10/15 1417  AST 13*  --   ALT 11*  --   ALKPHOS 47  --   BILITOT 0.6  --   PROT 6.3*  --   ALBUMIN 3.6 3.3*   No results for input(s): LIPASE, AMYLASE in the last 168 hours. CBC:  Recent Labs Lab 02/09/15 2337 02/11/15 0741  WBC 9.6 6.1  NEUTROABS 6.9  --   HGB 10.4* 10.5*  HCT 32.2* 33.5*  MCV 97.3 96.8  PLT 141* 123*   Blood Culture    Component Value Date/Time   SDES BLOOD ARM RIGHT 10/16/2011 1037   SPECREQUEST BOTTLES DRAWN AEROBIC AND ANAEROBIC 10CC 10/16/2011 1037   CULT NO GROWTH 5 DAYS 10/16/2011 1037   REPTSTATUS 10/22/2011 FINAL 10/16/2011 1037    Cardiac Enzymes: No results for input(s): CKTOTAL, CKMB, CKMBINDEX, TROPONINI in the last 168 hours. CBG:  Recent Labs Lab 02/10/15 1129 02/11/15 0910  GLUCAP 214* 183*  Iron Studies: No results for input(s): IRON, TIBC, TRANSFERRIN, FERRITIN in the last 72 hours. @lablastinr3 @ Studies/Results: X-ray Chest Pa And Lateral  02/10/2015   CLINICAL DATA:  Coronary artery disease.  EXAM: CHEST  2 VIEW  COMPARISON:  11/11/2014  FINDINGS: Progressive cardiomegaly compared to prior exam. Mediastinal contours are unchanged. Minimal vascular congestion. There is no pulmonary edema, confluent airspace disease, pleural effusion or pneumothorax. No acute osseous abnormalities are seen pre  IMPRESSION: Progressive cardiomegaly.  Mild vascular congestion.   Electronically Signed   By: Jeb Levering M.D.   On: 02/10/2015 03:16   Dg Ankle Complete Right  02/09/2015   CLINICAL DATA:  Pain following fall in parking lot earlier today   EXAM: RIGHT ANKLE - COMPLETE 3+ VIEW  COMPARISON:  April 10, 2007  FINDINGS: Frontal, oblique, and lateral views obtained. There is a comminuted fracture of the medial aspect of the distal tibial metaphysis with several mildly displaced fracture fragments. There is a fracture of the posterior aspect of the distal tibial metaphysis with alignment essentially anatomic. There is a comminuted fracture of the lateral malleolus with several distal fracture fragments displaced inferiorly. There is marked soft tissue swelling laterally. There is a small joint effusion. The ankle mortise appears disrupted on the oblique view. There are foci of arterial vascular calcification.  IMPRESSION: Bimalleolar type fracture with displaced fractures medially and laterally with a nondisplaced fracture of the posterior distal tibial metaphysis. There is ankle mortise disruption. There is marked soft tissue swelling laterally with joint effusion. There are multiple foci of arterial vascular calcification.   Electronically Signed   By: Lowella Grip III M.D.   On: 02/09/2015 14:24   Medications:   . amLODipine  10 mg Oral Daily  . aspirin EC  81 mg Oral Daily  . atorvastatin  40 mg Oral QPM  . cinacalcet  30 mg Oral Q breakfast  . cloNIDine  0.3 mg Oral 3 times per day  . clopidogrel  75 mg Oral Q breakfast  . docusate sodium  100 mg Oral BID  . gabapentin  300 mg Oral QPM  . heparin  5,000 Units Subcutaneous 3 times per day  . hydrALAZINE  50 mg Oral 3 times per day  . insulin aspart  0-15 Units Subcutaneous TID WC  . insulin detemir  20 Units Subcutaneous QHS  . isosorbide mononitrate  60 mg Oral Daily  . labetalol  300 mg Oral 3 times per day  . lamoTRIgine  100 mg Oral QHS  . minoxidil  10 mg Oral BID  . sevelamer carbonate  800 mg Oral TID AC & HS  . sodium chloride  3 mL Intravenous Q12H  . sodium chloride  3 mL Intravenous Q12H  . sodium chloride  3 mL Intravenous Q12H

## 2015-02-11 NOTE — Progress Notes (Signed)
Ref: Merrilee Seashore, MD   Subjective:  Little better. Improving sugar control. Afebrile. Awaiting surgery on recent bimalleolar fracture of right ankle. Cardiac cath with non-critical CAD.   Objective:  Vital Signs in the last 24 hours: Temp:  [98.1 F (36.7 C)-100.7 F (38.2 C)] 98.1 F (36.7 C) (09/17 0554) Pulse Rate:  [77-99] 77 (09/17 1354) Cardiac Rhythm:  [-] Normal sinus rhythm (09/17 0800) Resp:  [15-16] 15 (09/17 1354) BP: (123-179)/(59-107) 123/59 mmHg (09/17 1354) SpO2:  [96 %-98 %] 96 % (09/17 1354) Weight:  [108.4 kg (238 lb 15.7 oz)] 108.4 kg (238 lb 15.7 oz) (09/16 1708)  Physical Exam: BP Readings from Last 1 Encounters:  02/11/15 123/59    Wt Readings from Last 1 Encounters:  02/10/15 108.4 kg (238 lb 15.7 oz)    Weight change: 0.024 kg (0.9 oz)  HEENT: [place]/AT, Eyes-Brown, PERL, EOMI, Conjunctiva-Pale pink, Sclera-Non-icteric Neck: No JVD, No bruit, Trachea midline. Lungs:  Clear, Bilateral. Cardiac:  Regular rhythm, normal S1 and S2, no S3. II/VI systolic murmur. Abdomen:  Soft, non-tender. Extremities:  No edema present. No cyanosis. No clubbing. Right lower leg and ankle in Immobilizer CNS: AxOx3, Cranial nerves grossly intact, moves all 4 extremities.  Skin: Warm and dry.   Intake/Output from previous day: 09/16 0701 - 09/17 0700 In: 220 [P.O.:220] Out: 2903 [Urine:300]    Lab Results: BMET    Component Value Date/Time   NA 134* 02/11/2015 0741   NA 135 02/10/2015 1417   NA 135 02/09/2015 2337   K 4.4 02/11/2015 0741   K 5.2* 02/10/2015 1417   K 4.5 02/09/2015 2337   CL 95* 02/11/2015 0741   CL 98* 02/10/2015 1417   CL 95* 02/09/2015 2337   CO2 27 02/11/2015 0741   CO2 25 02/10/2015 1417   CO2 26 02/09/2015 2337   GLUCOSE 161* 02/11/2015 0741   GLUCOSE 188* 02/10/2015 1417   GLUCOSE 320* 02/09/2015 2337   BUN 40* 02/11/2015 0741   BUN 49* 02/10/2015 1417   BUN 49* 02/09/2015 2337   CREATININE 7.35* 02/11/2015 0741   CREATININE  7.94* 02/10/2015 1417   CREATININE 8.38* 02/09/2015 2337   CALCIUM 9.0 02/11/2015 0741   CALCIUM 8.7* 02/10/2015 1417   CALCIUM 8.6* 02/09/2015 2337   GFRNONAA 6* 02/11/2015 0741   GFRNONAA 6* 02/10/2015 1417   GFRNONAA 5* 02/09/2015 2337   GFRAA 7* 02/11/2015 0741   GFRAA 7* 02/10/2015 1417   GFRAA 6* 02/09/2015 2337   CBC    Component Value Date/Time   WBC 6.1 02/11/2015 0741   RBC 3.46* 02/11/2015 0741   RBC 2.48* 10/31/2014 0405   HGB 10.5* 02/11/2015 0741   HCT 33.5* 02/11/2015 0741   PLT 123* 02/11/2015 0741   MCV 96.8 02/11/2015 0741   MCH 30.3 02/11/2015 0741   MCHC 31.3 02/11/2015 0741   RDW 15.9* 02/11/2015 0741   LYMPHSABS 1.7 02/09/2015 2337   MONOABS 0.8 02/09/2015 2337   EOSABS 0.1 02/09/2015 2337   BASOSABS 0.0 02/09/2015 2337   HEPATIC Function Panel  Recent Labs  05/23/14 2224 10/30/14 1833 02/09/15 2337  PROT 7.8 6.5 6.3*   HEMOGLOBIN A1C No components found for: HGA1C,  MPG CARDIAC ENZYMES Lab Results  Component Value Date   CKTOTAL 84 09/23/2011   CKMB 4.5* 09/23/2011   TROPONINI 0.06* 10/31/2014   TROPONINI 0.09* 10/31/2014   TROPONINI 0.08* 10/30/2014   BNP No results for input(s): PROBNP in the last 8760 hours. TSH No results for input(s): TSH in the  last 8760 hours. CHOLESTEROL No results for input(s): CHOL in the last 8760 hours.  Scheduled Meds: . amLODipine  10 mg Oral Daily  . aspirin EC  81 mg Oral Daily  . atorvastatin  40 mg Oral QPM  . cinacalcet  30 mg Oral Q breakfast  . cloNIDine  0.3 mg Oral 3 times per day  . clopidogrel  75 mg Oral Q breakfast  . docusate sodium  100 mg Oral BID  . gabapentin  300 mg Oral QPM  . heparin  5,000 Units Subcutaneous 3 times per day  . hydrALAZINE  50 mg Oral 3 times per day  . insulin aspart  0-15 Units Subcutaneous TID WC  . insulin detemir  20 Units Subcutaneous QHS  . isosorbide mononitrate  60 mg Oral Daily  . labetalol  300 mg Oral 3 times per day  . lamoTRIgine  100 mg  Oral QHS  . minoxidil  10 mg Oral BID  . sevelamer carbonate  800 mg Oral TID AC & HS  . sodium chloride  3 mL Intravenous Q12H  . sodium chloride  3 mL Intravenous Q12H  . sodium chloride  3 mL Intravenous Q12H   Continuous Infusions:  PRN Meds:.sodium chloride, sodium chloride, acetaminophen, HYDROmorphone (DILAUDID) injection, nitroGLYCERIN, ondansetron **OR** ondansetron (ZOFRAN) IV, oxyCODONE, sodium chloride, sodium chloride  Assessment/Plan: CAD Anemia of chronic disease ESRD DM, II Dyslipidemia Hypertension Right ankle bimalleolar fracture  May undergo ankle surgery on Monday. Appreciate renal and orthopedic consults. Continue right leg elevation to decrease swelling before surgery.    LOS: 2 days    Dixie Dials  MD  02/11/2015, 3:50 PM

## 2015-02-12 DIAGNOSIS — Z7902 Long term (current) use of antithrombotics/antiplatelets: Secondary | ICD-10-CM | POA: Diagnosis not present

## 2015-02-12 DIAGNOSIS — Z794 Long term (current) use of insulin: Secondary | ICD-10-CM | POA: Diagnosis not present

## 2015-02-12 DIAGNOSIS — S82841B Displaced bimalleolar fracture of right lower leg, initial encounter for open fracture type I or II: Secondary | ICD-10-CM | POA: Diagnosis not present

## 2015-02-12 DIAGNOSIS — R0789 Other chest pain: Secondary | ICD-10-CM | POA: Diagnosis not present

## 2015-02-12 DIAGNOSIS — E1142 Type 2 diabetes mellitus with diabetic polyneuropathy: Secondary | ICD-10-CM | POA: Diagnosis present

## 2015-02-12 DIAGNOSIS — S82891D Other fracture of right lower leg, subsequent encounter for closed fracture with routine healing: Secondary | ICD-10-CM | POA: Diagnosis not present

## 2015-02-12 DIAGNOSIS — S82891A Other fracture of right lower leg, initial encounter for closed fracture: Secondary | ICD-10-CM | POA: Diagnosis not present

## 2015-02-12 DIAGNOSIS — I120 Hypertensive chronic kidney disease with stage 5 chronic kidney disease or end stage renal disease: Secondary | ICD-10-CM | POA: Diagnosis not present

## 2015-02-12 DIAGNOSIS — E1151 Type 2 diabetes mellitus with diabetic peripheral angiopathy without gangrene: Secondary | ICD-10-CM | POA: Diagnosis present

## 2015-02-12 DIAGNOSIS — S92351A Displaced fracture of fifth metatarsal bone, right foot, initial encounter for closed fracture: Secondary | ICD-10-CM | POA: Diagnosis not present

## 2015-02-12 DIAGNOSIS — M25561 Pain in right knee: Secondary | ICD-10-CM | POA: Diagnosis not present

## 2015-02-12 DIAGNOSIS — S82841D Displaced bimalleolar fracture of right lower leg, subsequent encounter for closed fracture with routine healing: Secondary | ICD-10-CM | POA: Diagnosis not present

## 2015-02-12 DIAGNOSIS — I10 Essential (primary) hypertension: Secondary | ICD-10-CM | POA: Diagnosis not present

## 2015-02-12 DIAGNOSIS — R52 Pain, unspecified: Secondary | ICD-10-CM | POA: Diagnosis not present

## 2015-02-12 DIAGNOSIS — S82843A Displaced bimalleolar fracture of unspecified lower leg, initial encounter for closed fracture: Secondary | ICD-10-CM | POA: Diagnosis present

## 2015-02-12 DIAGNOSIS — I1311 Hypertensive heart and chronic kidney disease without heart failure, with stage 5 chronic kidney disease, or end stage renal disease: Secondary | ICD-10-CM | POA: Diagnosis present

## 2015-02-12 DIAGNOSIS — E11610 Type 2 diabetes mellitus with diabetic neuropathic arthropathy: Secondary | ICD-10-CM | POA: Diagnosis present

## 2015-02-12 DIAGNOSIS — M25571 Pain in right ankle and joints of right foot: Secondary | ICD-10-CM | POA: Diagnosis not present

## 2015-02-12 DIAGNOSIS — E1165 Type 2 diabetes mellitus with hyperglycemia: Secondary | ICD-10-CM | POA: Diagnosis present

## 2015-02-12 DIAGNOSIS — I2510 Atherosclerotic heart disease of native coronary artery without angina pectoris: Secondary | ICD-10-CM | POA: Diagnosis not present

## 2015-02-12 DIAGNOSIS — W19XXXA Unspecified fall, initial encounter: Secondary | ICD-10-CM | POA: Diagnosis not present

## 2015-02-12 DIAGNOSIS — I5032 Chronic diastolic (congestive) heart failure: Secondary | ICD-10-CM | POA: Diagnosis not present

## 2015-02-12 DIAGNOSIS — E1129 Type 2 diabetes mellitus with other diabetic kidney complication: Secondary | ICD-10-CM | POA: Diagnosis not present

## 2015-02-12 DIAGNOSIS — N186 End stage renal disease: Secondary | ICD-10-CM | POA: Diagnosis not present

## 2015-02-12 DIAGNOSIS — N189 Chronic kidney disease, unspecified: Secondary | ICD-10-CM | POA: Diagnosis not present

## 2015-02-12 DIAGNOSIS — S82851A Displaced trimalleolar fracture of right lower leg, initial encounter for closed fracture: Secondary | ICD-10-CM | POA: Diagnosis present

## 2015-02-12 DIAGNOSIS — E1122 Type 2 diabetes mellitus with diabetic chronic kidney disease: Secondary | ICD-10-CM | POA: Diagnosis not present

## 2015-02-12 DIAGNOSIS — G8918 Other acute postprocedural pain: Secondary | ICD-10-CM | POA: Diagnosis not present

## 2015-02-12 DIAGNOSIS — D638 Anemia in other chronic diseases classified elsewhere: Secondary | ICD-10-CM | POA: Diagnosis present

## 2015-02-12 DIAGNOSIS — Z87891 Personal history of nicotine dependence: Secondary | ICD-10-CM | POA: Diagnosis not present

## 2015-02-12 DIAGNOSIS — S82841A Displaced bimalleolar fracture of right lower leg, initial encounter for closed fracture: Secondary | ICD-10-CM | POA: Diagnosis not present

## 2015-02-12 DIAGNOSIS — W1830XA Fall on same level, unspecified, initial encounter: Secondary | ICD-10-CM | POA: Diagnosis present

## 2015-02-12 DIAGNOSIS — N2581 Secondary hyperparathyroidism of renal origin: Secondary | ICD-10-CM | POA: Diagnosis present

## 2015-02-12 DIAGNOSIS — Z992 Dependence on renal dialysis: Secondary | ICD-10-CM | POA: Diagnosis not present

## 2015-02-12 LAB — GLUCOSE, CAPILLARY
GLUCOSE-CAPILLARY: 133 mg/dL — AB (ref 65–99)
GLUCOSE-CAPILLARY: 117 mg/dL — AB (ref 65–99)
GLUCOSE-CAPILLARY: 245 mg/dL — AB (ref 65–99)
Glucose-Capillary: 213 mg/dL — ABNORMAL HIGH (ref 65–99)

## 2015-02-12 MED ORDER — AMLODIPINE BESYLATE 10 MG PO TABS
10.0000 mg | ORAL_TABLET | Freq: Every day | ORAL | Status: DC
  Administered 2015-02-12 – 2015-02-17 (×2): 10 mg via ORAL
  Filled 2015-02-12 (×2): qty 1

## 2015-02-12 MED ORDER — DIPHENHYDRAMINE HCL 25 MG PO CAPS
25.0000 mg | ORAL_CAPSULE | Freq: Four times a day (QID) | ORAL | Status: DC | PRN
  Administered 2015-02-12 – 2015-02-17 (×8): 25 mg via ORAL
  Filled 2015-02-12 (×10): qty 1

## 2015-02-12 NOTE — Progress Notes (Signed)
Assessment/Plan: 1. Fx S/P bimalleolar fracture of right ankle. For open reduction internal fixation on Monday with Dr. Altamese [place]. 2. CAD/Cardiac Catheterization: Per Dr. Doylene Canard for pre op clearance. Having repair of bimaleolar fracture Monday.  3. ESRD - MWF at [facility]. For HD Monday. Will coordinate with OR schedule. 4. Hypertension/volume - ok  Subjective: Interval History: OR in AM  Objective: Vital signs in last 24 hours: Temp:  [98.3 F (36.8 C)-98.6 F (37 C)] 98.3 F (36.8 C) (09/18 0505) Pulse Rate:  [73-81] 73 (09/18 0505) Resp:  [15-21] 21 (09/18 0505) BP: (119-155)/(55-60) 130/59 mmHg (09/18 0826) SpO2:  [96 %-99 %] 96 % (09/18 0505) Weight change:   Intake/Output from previous day: 09/17 0701 - 09/18 0700 In: 240 [P.O.:240] Out: -  Intake/Output this shift: Total I/O In: 240 [P.O.:240] Out: -   General appearance: alert and cooperative Resp: clear to auscultation bilaterally Chest wall: no tenderness Cardio: regular rate and rhythm, S1, S2 normal, no murmur, click, rub or gallop boot on right LE  Lab Results:  Recent Labs  02/09/15 2337 02/11/15 0741  WBC 9.6 6.1  HGB 10.4* 10.5*  HCT 32.2* 33.5*  PLT 141* 123*   BMET:  Recent Labs  02/10/15 1417 02/11/15 0741  NA 135 134*  K 5.2* 4.4  CL 98* 95*  CO2 25 27  GLUCOSE 188* 161*  BUN 49* 40*  CREATININE 7.94* 7.35*  CALCIUM 8.7* 9.0   No results for input(s): PTH in the last 72 hours. Iron Studies: No results for input(s): IRON, TIBC, TRANSFERRIN, FERRITIN in the last 72 hours. Studies/Results: No results found.  Scheduled: . amLODipine  10 mg Oral Daily  . aspirin EC  81 mg Oral Daily  . atorvastatin  40 mg Oral QPM  . cinacalcet  30 mg Oral Q breakfast  . cloNIDine  0.3 mg Oral 3 times per day  . clopidogrel  75 mg Oral Q breakfast  . docusate sodium  100 mg Oral BID  . gabapentin  300 mg Oral QPM  . heparin  5,000 Units Subcutaneous 3 times per day  . hydrALAZINE  50 mg  Oral 3 times per day  . insulin aspart  0-15 Units Subcutaneous TID WC  . insulin detemir  20 Units Subcutaneous QHS  . isosorbide mononitrate  60 mg Oral Daily  . labetalol  300 mg Oral 3 times per day  . lamoTRIgine  100 mg Oral QHS  . minoxidil  10 mg Oral BID  . sevelamer carbonate  800 mg Oral TID AC & HS  . sodium chloride  3 mL Intravenous Q12H  . sodium chloride  3 mL Intravenous Q12H  . sodium chloride  3 mL Intravenous Q12H    LOS: 3 days   Margueritte Guthridge C 02/12/2015,11:49 AM

## 2015-02-12 NOTE — Progress Notes (Signed)
Subjective: 2 Days Post-Op Procedure(s) (LRB): Left Heart Cath and Coronary Angiography ([name])  With right ankle fracture  Activity level:  Nonweightbearing right leg Diet tolerance:  Ok but NPO after midnight tonight Voiding:  ok Patient reports pain as moderate.    Objective: Vital signs in last 24 hours: Temp:  [98.3 F (36.8 C)-98.6 F (37 C)] 98.3 F (36.8 C) (09/18 0505) Pulse Rate:  [73-81] 73 (09/18 0505) Resp:  [15-21] 21 (09/18 0505) BP: (119-155)/(55-60) 130/59 mmHg (09/18 0826) SpO2:  [96 %-99 %] 96 % (09/18 0505)  Labs:  Recent Labs  02/09/15 2337 02/11/15 0741  HGB 10.4* 10.5*    Recent Labs  02/09/15 2337 02/11/15 0741  WBC 9.6 6.1  RBC 3.31* 3.46*  HCT 32.2* 33.5*  PLT 141* 123*    Recent Labs  02/10/15 1417 02/11/15 0741  NA 135 134*  K 5.2* 4.4  CL 98* 95*  CO2 25 27  BUN 49* 40*  CREATININE 7.94* 7.35*  GLUCOSE 188* 161*  CALCIUM 8.7* 9.0    Recent Labs  02/09/15 2337  INR 1.09    Physical Exam:  Neurologically intact ABD soft Neurovascular intact Sensation intact distally Intact pulses distally Dorsiflexion/Plantar flexion intact No cellulitis present Compartment soft  Assessment/Plan:  2 Days Post-Op Procedure(s) (LRB): Left Heart Cath and Coronary Angiography ([name]) Patients skin is benign and swelling is decreasing. The plan at this time is for her to undergo an ankle ORIF tomorrow by Dr. Marcelino Scot. She will continue in her cam walker boot. If she is resting and icing it in bed and elevating it is ok for her to come out of the boot. If she is getting up at all or if she is going to sleep she needs the boot on.  She will be NPO after midnight.  NIDA, Larwance Sachs 02/12/2015, 1:06 PM

## 2015-02-12 NOTE — Progress Notes (Signed)
Subjective:  C/O right ankle pain and is uncomfortable. No chest pain or dyspnea  Objective:  Vital Signs in the last 24 hours: Temp:  [98.3 F (36.8 C)-98.6 F (37 C)] 98.3 F (36.8 C) (09/18 0505) Pulse Rate:  [73-81] 73 (09/18 0505) Resp:  [15-21] 21 (09/18 0505) BP: (119-155)/(55-60) 130/59 mmHg (09/18 0826) SpO2:  [96 %-99 %] 96 % (09/18 0505)  Intake/Output from previous day: 09/17 0701 - 09/18 0700 In: 240 [P.O.:240] Out: -   Physical Exam:   General appearance: alert, cooperative, appears older than stated age and no distress Eyes: negative findings: lids and lashes normal Neck: no adenopathy, no carotid bruit, no JVD, supple, symmetrical, trachea midline and thyroid not enlarged, symmetric, no tenderness/mass/nodules Neck: JVP - normal, carotids 2+= without bruits Resp: clear to auscultation bilaterally Chest wall: no tenderness Cardio: S1, S2 normal and 2/6 midsystolic murmur in the tricuspid area and also aortic area, no gallop. GI: soft, non-tender; bowel sounds normal; no masses,  no organomegaly Extremities: Right foot in cast. Left leg full range of movements, no edema.    Lab Results: BMP  Recent Labs  02/09/15 2337 02/10/15 1417 02/11/15 0741  NA 135 135 134*  K 4.5 5.2* 4.4  CL 95* 98* 95*  CO2 26 25 27   GLUCOSE 320* 188* 161*  BUN 49* 49* 40*  CREATININE 8.38* 7.94* 7.35*  CALCIUM 8.6* 8.7* 9.0  GFRNONAA 5* 6* 6*  GFRAA 6* 7* 7*    CBC  Recent Labs Lab 02/09/15 2337 02/11/15 0741  WBC 9.6 6.1  RBC 3.31* 3.46*  HGB 10.4* 10.5*  HCT 32.2* 33.5*  PLT 141* 123*  MCV 97.3 96.8  MCH 31.4 30.3  MCHC 32.3 31.3  RDW 15.9* 15.9*  LYMPHSABS 1.7  --   MONOABS 0.8  --   EOSABS 0.1  --   BASOSABS 0.0  --     HEMOGLOBIN A1C Lab Results  Component Value Date   HGBA1C 9.7* 10/31/2014   MPG 232 10/31/2014    Hepatic Function Panel  Recent Labs  05/23/14 2224 10/30/14 1833 02/09/15 2337 02/10/15 1417  PROT 7.8 6.5 6.3*  --    ALBUMIN 4.2 3.1* 3.6 3.3*  AST 14 13* 13*  --   ALT 12 17 11*  --   ALKPHOS 56 56 47  --   BILITOT 0.3 0.5 0.6  --     Imaging: Imaging results have been reviewed  Cardiac Studies:  EKG: 02/11/2015: Normal sinus rhythm at a rate of 76 bpm, normal axis. LVH with repolarization abnormality, cannot exclude lateral ischemia. No significant change from prior EKG.  Scheduled Meds: . amLODipine  10 mg Oral Daily  . aspirin EC  81 mg Oral Daily  . atorvastatin  40 mg Oral QPM  . cinacalcet  30 mg Oral Q breakfast  . cloNIDine  0.3 mg Oral 3 times per day  . clopidogrel  75 mg Oral Q breakfast  . docusate sodium  100 mg Oral BID  . gabapentin  300 mg Oral QPM  . heparin  5,000 Units Subcutaneous 3 times per day  . hydrALAZINE  50 mg Oral 3 times per day  . insulin aspart  0-15 Units Subcutaneous TID WC  . insulin detemir  20 Units Subcutaneous QHS  . isosorbide mononitrate  60 mg Oral Daily  . labetalol  300 mg Oral 3 times per day  . lamoTRIgine  100 mg Oral QHS  . minoxidil  10 mg Oral BID  .  sevelamer carbonate  800 mg Oral TID AC & HS  . sodium chloride  3 mL Intravenous Q12H  . sodium chloride  3 mL Intravenous Q12H  . sodium chloride  3 mL Intravenous Q12H   Continuous Infusions:  PRN Meds:.sodium chloride, sodium chloride, acetaminophen, HYDROmorphone (DILAUDID) injection, nitroGLYCERIN, ondansetron **OR** ondansetron (ZOFRAN) IV, oxyCODONE, sodium chloride, sodium chloride   Assessment/Plan:  1. Noncritical coronary artery disease with normal LV systolic function 2. 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 Hyperlipidemia X line 4. Hypertension with hypertensive heart disease 4. Right ankle fracture 5. 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   Recommendation: From cardiac standpoint she remains stable. Further management will be according to orthopedics for fracture management, continued  hemodialysis and I'll request hospitalist to see whether they would like to take over the care for management of diabetes and hypertension.   Adrian Prows, M.D. 02/12/2015, 12:31 PM [facility] Pager: [phone] Office: [phone] If no answer: [phone]

## 2015-02-13 ENCOUNTER — Inpatient Hospital Stay (HOSPITAL_COMMUNITY): Payer: Medicare Other

## 2015-02-13 ENCOUNTER — Encounter (HOSPITAL_COMMUNITY): Payer: Self-pay | Admitting: Orthopedic Surgery

## 2015-02-13 DIAGNOSIS — S82841D Displaced bimalleolar fracture of right lower leg, subsequent encounter for closed fracture with routine healing: Secondary | ICD-10-CM

## 2015-02-13 DIAGNOSIS — I10 Essential (primary) hypertension: Secondary | ICD-10-CM

## 2015-02-13 DIAGNOSIS — E785 Hyperlipidemia, unspecified: Secondary | ICD-10-CM

## 2015-02-13 DIAGNOSIS — I2510 Atherosclerotic heart disease of native coronary artery without angina pectoris: Secondary | ICD-10-CM

## 2015-02-13 DIAGNOSIS — N186 End stage renal disease: Secondary | ICD-10-CM

## 2015-02-13 DIAGNOSIS — Z992 Dependence on renal dialysis: Secondary | ICD-10-CM

## 2015-02-13 LAB — RENAL FUNCTION PANEL
ALBUMIN: 3 g/dL — AB (ref 3.5–5.0)
Anion gap: 14 (ref 5–15)
BUN: 81 mg/dL — AB (ref 6–20)
CALCIUM: 8.7 mg/dL — AB (ref 8.9–10.3)
CO2: 24 mmol/L (ref 22–32)
CREATININE: 12.31 mg/dL — AB (ref 0.44–1.00)
Chloride: 95 mmol/L — ABNORMAL LOW (ref 101–111)
GFR calc Af Amer: 4 mL/min — ABNORMAL LOW (ref >60)
GFR, EST NON AFRICAN AMERICAN: 3 mL/min — AB (ref >60)
GLUCOSE: 120 mg/dL — AB (ref 65–99)
PHOSPHORUS: 9.8 mg/dL — AB (ref 2.5–4.6)
POTASSIUM: 5.9 mmol/L — AB (ref 3.5–5.1)
SODIUM: 133 mmol/L — AB (ref 135–145)

## 2015-02-13 LAB — CBC
HEMATOCRIT: 26.6 % — AB (ref 36.0–46.0)
Hemoglobin: 8.7 g/dL — ABNORMAL LOW (ref 12.0–15.0)
MCH: 30.7 pg (ref 26.0–34.0)
MCHC: 32.7 g/dL (ref 30.0–36.0)
MCV: 94 fL (ref 78.0–100.0)
PLATELETS: 121 10*3/uL — AB (ref 150–400)
RBC: 2.83 MIL/uL — ABNORMAL LOW (ref 3.87–5.11)
RDW: 15.7 % — AB (ref 11.5–15.5)
WBC: 7 10*3/uL (ref 4.0–10.5)

## 2015-02-13 LAB — GLUCOSE, CAPILLARY
GLUCOSE-CAPILLARY: 212 mg/dL — AB (ref 65–99)
GLUCOSE-CAPILLARY: 105 mg/dL — AB (ref 65–99)
GLUCOSE-CAPILLARY: 126 mg/dL — AB (ref 65–99)
Glucose-Capillary: 166 mg/dL — ABNORMAL HIGH (ref 65–99)

## 2015-02-13 MED ORDER — OXYCODONE HCL 5 MG PO TABS
ORAL_TABLET | ORAL | Status: AC
  Filled 2015-02-13: qty 2

## 2015-02-13 MED ORDER — OXYCODONE HCL 5 MG PO TABS
5.0000 mg | ORAL_TABLET | ORAL | Status: DC | PRN
  Administered 2015-02-13 – 2015-02-17 (×12): 10 mg via ORAL
  Filled 2015-02-13 (×12): qty 2

## 2015-02-13 MED ORDER — HYDROCODONE-ACETAMINOPHEN 5-325 MG PO TABS
1.0000 | ORAL_TABLET | Freq: Four times a day (QID) | ORAL | Status: DC | PRN
  Administered 2015-02-13 – 2015-02-18 (×7): 2 via ORAL
  Filled 2015-02-13 (×8): qty 2

## 2015-02-13 NOTE — Progress Notes (Signed)
Orthopedic Tech Progress Note Patient Details:  [name] 1976/03/19 [id number]  Ortho Devices Type of Ortho Device: Ace wrap, Post (short leg) splint, Stirrup splint Splint Material: Fiberglass Ortho Device/Splint Interventions: Application   [name], [name] 02/13/2015, 3:54 PM

## 2015-02-13 NOTE — Progress Notes (Signed)
Subjective:  For right ankle surgery today. No chest pain or dyspnea  Objective:  Vital Signs in the last 24 hours: Temp:  [97.3 F (36.3 C)-98.6 F (37 C)] 97.3 F (36.3 C) (09/19 0900) Pulse Rate:  [77-84] 79 (09/19 0900) Resp:  [16-18] 17 (09/19 0439) BP: (130-152)/(57-63) 132/57 mmHg (09/19 0900) SpO2:  [95 %-98 %] 97 % (09/19 0900)  Intake/Output from previous day: 09/18 0701 - 09/19 0700 In: 53 [P.O.:580] Out: -   Physical Exam:   General appearance: alert, cooperative, appears older than stated age and no distress Eyes: negative findings: lids and lashes normal Neck: no adenopathy, no carotid bruit, no JVD, supple, symmetrical, trachea midline and thyroid not enlarged, symmetric, no tenderness/mass/nodules Neck: JVP - normal, carotids 2+= without bruits Resp: clear to auscultation bilaterally Chest wall: no tenderness Cardio: S1, S2 normal and 2/6 midsystolic murmur in the tricuspid area and also aortic area, no gallop. GI: soft, non-tender; bowel sounds normal; no masses,  no organomegaly Extremities: Right foot in cast. Left leg full range of movements, no edema.    Lab Results: BMP  Recent Labs  02/09/15 2337 02/10/15 1417 02/11/15 0741  NA 135 135 134*  K 4.5 5.2* 4.4  CL 95* 98* 95*  CO2 26 25 27   GLUCOSE 320* 188* 161*  BUN 49* 49* 40*  CREATININE 8.38* 7.94* 7.35*  CALCIUM 8.6* 8.7* 9.0  GFRNONAA 5* 6* 6*  GFRAA 6* 7* 7*    CBC  Recent Labs Lab 02/09/15 2337 02/11/15 0741  WBC 9.6 6.1  RBC 3.31* 3.46*  HGB 10.4* 10.5*  HCT 32.2* 33.5*  PLT 141* 123*  MCV 97.3 96.8  MCH 31.4 30.3  MCHC 32.3 31.3  RDW 15.9* 15.9*  LYMPHSABS 1.7  --   MONOABS 0.8  --   EOSABS 0.1  --   BASOSABS 0.0  --     HEMOGLOBIN A1C Lab Results  Component Value Date   HGBA1C 9.7* 10/31/2014   MPG 232 10/31/2014    Hepatic Function Panel  Recent Labs  05/23/14 2224 10/30/14 1833 02/09/15 2337 02/10/15 1417  PROT 7.8 6.5 6.3*  --   ALBUMIN 4.2  3.1* 3.6 3.3*  AST 14 13* 13*  --   ALT 12 17 11*  --   ALKPHOS 56 56 47  --   BILITOT 0.3 0.5 0.6  --     Imaging: Imaging results have been reviewed  Cardiac Studies:  EKG: 02/11/2015: Normal sinus rhythm at a rate of 76 bpm, normal axis. LVH with repolarization abnormality, cannot exclude lateral ischemia. No significant change from prior EKG.  Scheduled Meds: . amLODipine  10 mg Oral Daily  . aspirin EC  81 mg Oral Daily  . atorvastatin  40 mg Oral QPM  . cinacalcet  30 mg Oral Q breakfast  . cloNIDine  0.3 mg Oral 3 times per day  . clopidogrel  75 mg Oral Q breakfast  . docusate sodium  100 mg Oral BID  . gabapentin  300 mg Oral QPM  . heparin  5,000 Units Subcutaneous 3 times per day  . hydrALAZINE  50 mg Oral 3 times per day  . insulin aspart  0-15 Units Subcutaneous TID WC  . insulin detemir  20 Units Subcutaneous QHS  . isosorbide mononitrate  60 mg Oral Daily  . labetalol  300 mg Oral 3 times per day  . lamoTRIgine  100 mg Oral QHS  . minoxidil  10 mg Oral BID  . sevelamer  carbonate  800 mg Oral TID AC & HS  . sodium chloride  3 mL Intravenous Q12H  . sodium chloride  3 mL Intravenous Q12H  . sodium chloride  3 mL Intravenous Q12H   Continuous Infusions:  PRN Meds:.sodium chloride, sodium chloride, acetaminophen, diphenhydrAMINE, HYDROcodone-acetaminophen, HYDROmorphone (DILAUDID) injection, nitroGLYCERIN, ondansetron **OR** ondansetron (ZOFRAN) IV, oxyCODONE, sodium chloride, sodium chloride   Assessment/Plan:  1. Noncritical coronary artery disease with normal LV systolic function 2. 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 Hyperlipidemia X line 4. Hypertension with hypertensive heart disease 4. Right ankle fracture, surgery scheduled for Thursday. 5. 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  6. Hypertension, difficult to control in the out patient setting, but now well controlled,  also suspect non compliance.  Recommendation: From cardiac standpoint she remains stable. Further management will be according to orthopedics for fracture management, continued hemodialysis, patient is going to be IP for > 24 hours, she will need transfer of care to hospitalist  Service to see whether they would like to take over the care for management of diabetes and hypertension. Had some rash last night and received Benadryl and doing well now. Nothing to contribute from cardiac standpoint.   Adrian Prows, M.D. 02/13/2015, 9:37 AM [facility] Pager: [phone] Office: [phone] If no answer: [phone]

## 2015-02-13 NOTE — Progress Notes (Signed)
PT Cancellation Note  Patient Details Name: [name] MRN: [id number] DOB: [birth year]   Cancelled Treatment:    Reason Eval/Treat Not Completed: Pain limiting ability to participate (pt reports 10/10 pain in R ankle, has had pain meds, stated she can't tolerate OOB right now. Will follow. )   Philomena Doheny 02/13/2015, 1:13 PM [phone]

## 2015-02-13 NOTE — Consult Note (Signed)
Patient Demographics  [name], is a 39 y.o. female   MRN: [id number]   DOB - [birth year]  Admit Date - 02/09/2015    Outpatient Primary MD for the patient is Merrilee Seashore, MD  Consult requested in the Hospital by Dixie Dials, MD, On 02/13/2015    Reason for consult : Medical management , transfer of care   With History of -  Past Medical History  Diagnosis Date  . DM (diabetes mellitus)     Long-term insulin  . Hypertension   . Tobacco use disorder     Discontinued March 2012  . Hyperlipidemia     Hypertriglyceridemia 449 HDL 25  . Diabetic Charcot's joint disease   . Coronary artery disease     Status post cardiac catheterization June 2012 scattered coronary artery disease/atherosclerosis with 70-80% stenosis in a small right PDA.  . Gastroparesis   . Peripheral vascular disease     Tibial occlusive disease evaluated by Dr. Kellie Simmering in August 2011. Medical therapy  . 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 CIN III (cervical intraepithelial neoplasia grade III) with severe dysplasia     S/P LEEP AND CONE  . Chronic anemia     2nd to renal disease  . End stage renal disease on dialysis 05/02/11    [facility]; M; W, F; last time 05/01/11  . Migraines     "just on dialysis days"  . Peripheral neuropathy     related to DM  . History of abscesses in groins 12/06/2010  . Renal insufficiency     Dialysis since 2012  . Fracture of 5th metatarsal 2016    Right  . Trimalleolar fracture of ankle, closed 02/09/2015    Right      Past Surgical History  Procedure Laterality Date  . Av fistula placement  04/2010  . Dilation and curettage of uterus  2009  . Cervical biopsy  w/ loop electrode excision      h/o  . Cervical cone biopsy      h/o  . Colposcopy    . Cardiac catheterization [name] 02/10/2015    Procedure: Left Heart Cath and Coronary Angiography;  Surgeon: Dixie Dials, MD;  Location: [facility];  Service: Cardiovascular;  Laterality: [name];    in for   No chief complaint on file.    HPI  [name]  is a 39 y.o. female, with past medical history of diabetes mellitus, coronary artery disease, peripheral neuropathy, end-stage renal disease on hemodialysis, peripheral vascular disease, consistent ground-level fall on 02/09/2015, with bimalleolar right ankle fracture, admitted by Dr. Oralia Rud for cardiac cath, as part of preop clearance, as patient was planned for cardiac cath 9/27 , admitted for cardiac cath on 9/16 significant for noncritical coronary artery disease with normal LV systolic function, the plan by orthopedic for surgical repair currently on hold secondary to significant ankle edema, patient is been followed by nephrology service for hemodialysis, seen by orthopedic with plan for surgery this coming Thursday, cardiology consulted  hospitalist service to resume care and given no cardiac issues, and patient completely cured medical history.  Significant events: - Fall 9/17 with right bibasilar fracture - Admitted 9/16 by Dr. Doristine Counter for cath as part of preoperative clearance. - Cardiac cath 9/16 with noncritical coronary artery disease with preserved LV function. - Patient transferred to hospitalist service 02/13/2015    Review of Systems    In addition to the HPI above,  No Fever-chills, No Headache, No changes with Vision or hearing, No problems swallowing food or Liquids, No Chest pain, Cough or Shortness of Breath, No Abdominal pain, No Nausea or Vommitting, Bowel movements are regular, No Blood in stool or Urine, No dysuria, No new skin rashes or bruises, Planes of right ankle pain No new weakness, tingling, numbness in any extremity, No recent weight gain or loss, No polyuria, polydypsia or polyphagia, No significant Mental Stressors.  A full 10 point Review of Systems was done, except as stated above, all other Review of Systems were  negative.   Social History Social History  Substance Use Topics  . Smoking status: Former Smoker -- 0.30 packs/day for 10 years    Types: Cigarettes    Quit date: 08/05/2011  . Smokeless tobacco: Never Used     Comment: "quit smoking cigarettes 07/2010"  . Alcohol Use: No     Family History Family History  Problem Relation Age of Onset  . Diabetes Mother   . Hypertension Mother   . Diabetes Father   . Hyperlipidemia Father   . Hypertension Father   . Diabetes Sister   . Breast cancer Maternal Aunt     Age 72's  . Stroke Maternal Grandmother   . Diabetes Sister     Prior to Admission medications   Medication Sig Start Date End Date Taking? Authorizing Provider  amLODipine (NORVASC) 10 MG tablet Take 1 tablet (10 mg total) by mouth daily. 07/05/13  Yes Minus Breeding, MD  atorvastatin (LIPITOR) 40 MG tablet Take 40 mg by mouth daily.   Yes Historical Provider, MD  cinacalcet (SENSIPAR) 30 MG tablet Take 30 mg by mouth daily.   Yes Historical Provider, MD  cloNIDine (CATAPRES) 0.3 MG tablet Take 1 tablet (0.3 mg total) by mouth 3 (three) times daily. 11/01/14  Yes Debbe Odea, MD  clopidogrel (PLAVIX) 75 MG tablet Take 75 mg by mouth at bedtime.    Yes Historical Provider, MD  HUMALOG MIX 50/50 KWIKPEN (50-50) 100 UNIT/ML Kwikpen Inject 26-28 Units into the skin 2 (two) times daily. 28 in the morning and 26 in the evening. 04/29/14  Yes Historical Provider, MD  hydrALAZINE (APRESOLINE) 50 MG tablet Take 1 tablet (50 mg total) by mouth every 8 (eight) hours. 11/01/14  Yes Debbe Odea, MD  HYDROcodone-acetaminophen (NORCO/VICODIN) 5-325 MG per tablet Take 1-2 tablets by mouth every 4 (four) hours as needed for moderate pain or severe pain. 02/09/15  Yes Waynetta Pean, PA-C  isosorbide mononitrate (IMDUR) 60 MG 24 hr tablet Take 60 mg by mouth daily.   Yes Historical Provider, MD  labetalol (NORMODYNE) 300 MG tablet Take 1 tablet (300 mg total) by mouth 3 (three) times daily. 11/01/14  Yes  Debbe Odea, MD  lamoTRIgine (LAMICTAL) 25 MG tablet Take 100 mg by mouth at bedtime.   Yes Historical Provider, MD  minoxidil (LONITEN) 10 MG tablet Take 10 mg by mouth 2 (two) times daily.   Yes Historical Provider, MD  nitroGLYCERIN (NITROSTAT) 0.4 MG SL tablet Place 0.4 mg under the  tongue every 5 (five) minutes as needed. For chest pain   Yes Historical Provider, MD  RENVELA 800 MG tablet Take 1 tablet by mouth 3 (three) times daily with meals.  04/17/13  Yes Historical Provider, MD  atorvastatin (LIPITOR) 80 MG tablet Take 1 tablet (80 mg total) by mouth every evening. 02/06/12   Donney Dice, PA-C  chlorpheniramine-HYDROcodone (TUSSIONEX PENNKINETIC ER) 10-8 MG/5ML SUER Take 5 mLs by mouth 2 (two) times daily. Patient not taking: Reported on 02/10/2015 10/29/14   Roselee Culver, MD  ondansetron (ZOFRAN) 4 MG tablet Take 1 tablet (4 mg total) by mouth every 8 (eight) hours as needed for nausea or vomiting. Patient not taking: Reported on 02/10/2015 05/24/14   Clayton Bibles, PA-C    Anti-infectives    None      Scheduled Meds: . amLODipine  10 mg Oral Daily  . aspirin EC  81 mg Oral Daily  . atorvastatin  40 mg Oral QPM  . cinacalcet  30 mg Oral Q breakfast  . cloNIDine  0.3 mg Oral 3 times per day  . clopidogrel  75 mg Oral Q breakfast  . docusate sodium  100 mg Oral BID  . heparin  5,000 Units Subcutaneous 3 times per day  . hydrALAZINE  50 mg Oral 3 times per day  . insulin aspart  0-15 Units Subcutaneous TID WC  . insulin detemir  20 Units Subcutaneous QHS  . isosorbide mononitrate  60 mg Oral Daily  . labetalol  300 mg Oral 3 times per day  . lamoTRIgine  100 mg Oral QHS  . minoxidil  10 mg Oral BID  . sevelamer carbonate  800 mg Oral TID AC & HS  . sodium chloride  3 mL Intravenous Q12H  . sodium chloride  3 mL Intravenous Q12H  . sodium chloride  3 mL Intravenous Q12H   Continuous Infusions:  PRN Meds:.sodium chloride, sodium chloride, acetaminophen, diphenhydrAMINE,  HYDROcodone-acetaminophen, HYDROmorphone (DILAUDID) injection, nitroGLYCERIN, ondansetron **OR** ondansetron (ZOFRAN) IV, oxyCODONE, sodium chloride, sodium chloride  Allergies  Allergen Reactions  . Cephalexin Other (See Comments)    Reaction unknown  . Sulfamethoxazole-Trimethoprim Other (See Comments)    Thrush    Physical Exam  Vitals  Blood pressure 145/58, pulse 77, temperature 97.4 F (36.3 C), temperature source Oral, resp. rate 22, height 6\' 3"  (1.905 m), weight 108.5 kg (239 lb 3.2 oz), last menstrual period 01/09/2015, SpO2 98 %.   1. General  Well nourished female lying in bed in NAD,    2. Normal affect and insight, Not Suicidal or Homicidal, Awake Alert, Oriented X 3.  3. No F.N deficits, ALL C.Nerves Intact, Strength 5/5 all 4 extremities, Sensation intact all 4 extremities, Plantars down going.  4. Ears and Eyes appear Normal, Conjunctivae clear, PERRLA. Moist Oral Mucosa.  5. Supple Neck, No JVD, No cervical lymphadenopathy appriciated, No Carotid Bruits.  6. Symmetrical Chest wall movement, Good air movement bilaterally, CTAB.  7. RRR, No Gallops, Rubs or Murmurs, No Parasternal Heave.  8. Positive Bowel Sounds, Abdomen Soft, No tenderness, No organomegaly appriciated,No rebound -guarding or rigidity.  9.  No Cyanosis, Normal Skin Turgor, No Skin Rash or Bruise.  10. Good muscle tone, right LE with ACE rap, no cyanosis, good capillary refill.  11. No Palpable Lymph Nodes in Neck or Axillae   Data Review  CBC  Recent Labs Lab 02/09/15 2337 02/11/15 0741 02/13/15 1427  WBC 9.6 6.1 7.0  HGB 10.4* 10.5* 8.7*  HCT 32.2*  33.5* 26.6*  PLT 141* 123* 121*  MCV 97.3 96.8 94.0  MCH 31.4 30.3 30.7  MCHC 32.3 31.3 32.7  RDW 15.9* 15.9* 15.7*  LYMPHSABS 1.7  --   --   MONOABS 0.8  --   --   EOSABS 0.1  --   --   BASOSABS 0.0  --   --     ------------------------------------------------------------------------------------------------------------------  Chemistries   Recent Labs Lab 02/09/15 2337 02/10/15 1417 02/11/15 0741 02/13/15 1427  NA 135 135 134* 133*  K 4.5 5.2* 4.4 5.9*  CL 95* 98* 95* 95*  CO2 26 25 27 24   GLUCOSE 320* 188* 161* 120*  BUN 49* 49* 40* 81*  CREATININE 8.38* 7.94* 7.35* 12.31*  CALCIUM 8.6* 8.7* 9.0 8.7*  AST 13*  --   --   --   ALT 11*  --   --   --   ALKPHOS 47  --   --   --   BILITOT 0.6  --   --   --    ------------------------------------------------------------------------------------------------------------------ estimated creatinine clearance is 8.9 mL/min (by C-G formula based on Cr of 12.31). ------------------------------------------------------------------------------------------------------------------ No results for input(s): TSH, T4TOTAL, T3FREE, THYROIDAB in the last 72 hours.  Invalid input(s): FREET3   Coagulation profile  Recent Labs Lab 02/09/15 2337  INR 1.09   ------------------------------------------------------------------------------------------------------------------- No results for input(s): DDIMER in the last 72 hours. -------------------------------------------------------------------------------------------------------------------  Cardiac Enzymes No results for input(s): CKMB, TROPONINI, MYOGLOBIN in the last 168 hours.  Invalid input(s): CK ------------------------------------------------------------------------------------------------------------------ Invalid input(s): POCBNP   ---------------------------------------------------------------------------------------------------------------  Urinalysis    Component Value Date/Time   COLORURINE YELLOW 02/08/2011 1812   APPEARANCEUR CLEAR 02/08/2011 1812   LABSPEC 1.016 02/08/2011 1812   PHURINE 8.0 02/08/2011 1812   GLUCOSEU 250* 02/08/2011 1812   HGBUR NEGATIVE 02/08/2011 1812    BILIRUBINUR NEGATIVE 02/08/2011 1812   KETONESUR NEGATIVE 02/08/2011 1812   PROTEINUR >300* 02/08/2011 1812   UROBILINOGEN 0.2 02/08/2011 1812   NITRITE NEGATIVE 02/08/2011 1812   LEUKOCYTESUR NEGATIVE 02/08/2011 1812     Imaging results:   Dg Knee Right Port  02/13/2015   CLINICAL DATA:  Injury January 2016.  Pain.  EXAM: PORTABLE RIGHT KNEE - 1-2 VIEW  COMPARISON:  None.  FINDINGS: New no acute bony or joint abnormality identified. No evidence of fracture dislocation. Osteopenia is noted. Atherosclerotic vascular calcification noted.  IMPRESSION: 1. No acute abnormality.  Diffuse osteopenia. 2. Peripheral vascular disease .   Electronically Signed   By: Marcello Moores  Register   On: 02/13/2015 12:29   Dg Foot Complete Right  02/13/2015   CLINICAL DATA:  Previous fracture. Throbbing, constant right ankle pain.  EXAM: RIGHT FOOT COMPLETE - 3+ VIEW  COMPARISON:  CT foot dated 09/08/2014.  FINDINGS: There has been interval healing at the previously noted fracture in the mid to distal right fifth metatarsal bone.  There is now seen a slightly displaced fracture at the base of the right fifth metatarsal bone, appears new compared to the previous CT, with adjacent soft tissue swelling/edema.  No other osseous fracture or dislocation identified. No joint space malalignment. Extensive small vessel atherosclerotic calcifications are again seen throughout soft tissues of the right foot.  IMPRESSION: 1. New slightly displaced avulsion fracture at the base of the right fifth metatarsal bone with overlying soft tissue swelling/edema. 2. Interval healing of the fracture in the mid to distal right fifth metatarsal bone.   Electronically Signed   By: Franki Cabot M.D.   On: 02/13/2015 12:41  Assessment & Plan  Principal Problem:   Chest pain Active Problems:   Essential hypertension, benign   End stage renal disease on dialysis   Coronary artery disease   Hyperlipidemia   CAD (coronary artery  disease)   Bimalleolar ankle fracture  Fall with right bimalleolar fracture - Patient is followed by orthopedic, during her significant swelling, plan to postpone surgical this coming Thursday, leg elevation, apply ice, to help minimize her swelling, ABI was ordered to evaluate for PVD as well. - When necessary pain meds  History of coronary artery disease - By cardiology, recent cardiac cath with noncritical coronary artery disease -  denies any chest pain or shortness of breath - she is on aspirin and plavix, Imdur statin and beta blockers  End-stage renal disease - seen by nephrology, on hemodialysis.  Hypertension - Blood pressure acceptable on Norvasc, clonidine, hydralazine, minoxidil, Imdur and labetalol  Diabetes mellitus - Continue with levemir  and insulin sliding scale  Hyperlipidemia - Continue with statin   DVT Prophylaxis [place] heparin  AM Labs Ordered, also please review Full Orders  Patient would be transferred to triad hospitalist service on 02/13/2015   Thank you for the consult, we will follow the patient with you in the Hospital.   [facility], DAWOOD M.D on 02/13/2015 at 6:31 PM  Between 7am to 7pm - Pager - [phone]  After 7pm go to [email or web address] - password [email or web address]   Thank you for the consult, we will follow the patient with you in the [facility]   Office  [phone]

## 2015-02-13 NOTE — Consult Note (Signed)
Orthopaedic Trauma Service (OTS)  Reason for Consult: Right ankle fracture Referring Physician: Frederik Pear, MD (ortho)   HPI: [name] is an 39 y.o. white female with a complex medical history including diabetes mellitus with insulin therapy, CAD, peripheral neuropathy and end-stage renal disease on dialysis and PVD who sustained a ground-level fall on 02/09/2015. Patient was in the parking lot at the [facility] store when she just fell down rolling her right ankle. Patient had immediate onset of pain and inability to bear weight. She was brought to was a long hospital where she was found to have a right ankle fracture both of her fibula and of her tibia. Case was discussed with Dr. Mayer Camel, who had the patient follow-up at his office that day. Case was discussed with Dr. handy as well and we recommended surgical intervention. Dr. Mayer Camel agreed with this and discuss this with the patient. It came as attention she was scheduled to have a cardiac catheterization next week. He discussed this with cardiology and the patient was admitted on Friday for her cardiac cath. This was performed successfully on Friday. I did attempt to see the patient on Friday but she was in the cardiac Cath Lab. Patient was placed in a compressive wrap in a CAM boot over the weekend.   patient was seen today by the orthopedic trauma service on 3 W. 18. She complains of fairly severe right ankle pain. Again baseline peripheral neuropathy mostly affecting the plantar aspects of her feet bilaterally.  Denies any knee pain  Pain isolated to her right ankle  Pain is increased with movement. Relieved With rest and some relief with her current pain regimen   Patient does have a history of a right fifth metatarsal fracture which is being followed by Dr. Sharol Given. Review of imaging. 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 Last imaging of her foot was on 09/08/2014 which is a CT scan  which demonstrated nonunion    PCP: Dr. Bethanie DickerVianne Bulls  Dialysis MWF  Cards: Dr. Einar Gip   Past Medical History  Diagnosis Date  . DM (diabetes mellitus)     Long-term insulin  . Hypertension   . Tobacco use disorder     Discontinued March 2012  . Hyperlipidemia     Hypertriglyceridemia 449 HDL 25  . Diabetic Charcot's joint disease   . Coronary artery disease     Status post cardiac catheterization June 2012 scattered coronary artery disease/atherosclerosis with 70-80% stenosis in a small right PDA.  . Gastroparesis   . Peripheral vascular disease     Tibial occlusive disease evaluated by Dr. Kellie Simmering in August 2011. Medical therapy  . 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 CIN III (cervical intraepithelial neoplasia grade III) with severe dysplasia     S/P LEEP AND CONE  . Chronic anemia     2nd to renal disease  . End stage renal disease on dialysis 05/02/11    [facility]; M; W, F; last time 05/01/11  . Migraines     "just on dialysis days"  . Peripheral neuropathy     related to DM  . History of abscesses in groins 12/06/2010  . Renal insufficiency     Dialysis since 2012    Past Surgical History  Procedure Laterality Date  . Av fistula placement  04/2010  . Dilation and curettage of uterus  2009  . Cervical biopsy  w/ loop electrode excision  h/o  . Cervical cone biopsy      h/o  . Colposcopy    . Cardiac catheterization [name] 02/10/2015    Procedure: Left Heart Cath and Coronary Angiography;  Surgeon: Dixie Dials, MD;  Location: [facility];  Service: Cardiovascular;  Laterality: [name];    Family History  Problem Relation Age of Onset  . Diabetes Mother   . Hypertension Mother   . Diabetes Father   . Hyperlipidemia Father   . Hypertension Father   . Diabetes Sister   . Breast cancer Maternal Aunt     Age 24's  . Stroke Maternal Grandmother   . Diabetes Sister     Social History:  reports that she quit smoking about 3 years ago. Her smoking use  included Cigarettes. She has a 3 pack-year smoking history. She has never used smokeless tobacco. She reports that she does not drink alcohol or use illicit drugs.  Allergies:  Allergies  Allergen Reactions  . Cephalexin Other (See Comments)    Reaction unknown  . Sulfamethoxazole-Trimethoprim Other (See Comments)    Thrush    Medications:  I have reviewed the patient's current medications. Prior to Admission:  Prescriptions prior to admission  Medication Sig Dispense Refill Last Dose  . amLODipine (NORVASC) 10 MG tablet Take 1 tablet (10 mg total) by mouth daily. 30 tablet 0 02/09/2015 at Unknown time  . atorvastatin (LIPITOR) 40 MG tablet Take 40 mg by mouth daily.   02/09/2015 at Unknown time  . cinacalcet (SENSIPAR) 30 MG tablet Take 30 mg by mouth daily.   02/09/2015 at Unknown time  . cloNIDine (CATAPRES) 0.3 MG tablet Take 1 tablet (0.3 mg total) by mouth 3 (three) times daily. 90 tablet 0 02/10/2015 at Unknown time  . clopidogrel (PLAVIX) 75 MG tablet Take 75 mg by mouth at bedtime.    02/10/2015 at Unknown time  . HUMALOG MIX 50/50 KWIKPEN (50-50) 100 UNIT/ML Kwikpen Inject 26-28 Units into the skin 2 (two) times daily. 28 in the morning and 26 in the evening.   02/09/2015 at Unknown time  . hydrALAZINE (APRESOLINE) 50 MG tablet Take 1 tablet (50 mg total) by mouth every 8 (eight) hours. 90 tablet 0 02/10/2015 at Unknown time  . HYDROcodone-acetaminophen (NORCO/VICODIN) 5-325 MG per tablet Take 1-2 tablets by mouth every 4 (four) hours as needed for moderate pain or severe pain. 15 tablet 0 02/09/2015 at Unknown time  . isosorbide mononitrate (IMDUR) 60 MG 24 hr tablet Take 60 mg by mouth daily.   02/10/2015 at Unknown time  . labetalol (NORMODYNE) 300 MG tablet Take 1 tablet (300 mg total) by mouth 3 (three) times daily. 270 tablet 3 02/10/2015 at unsure  . lamoTRIgine (LAMICTAL) 25 MG tablet Take 100 mg by mouth at bedtime.   02/09/2015 at Unknown time  . minoxidil (LONITEN) 10 MG tablet  Take 10 mg by mouth 2 (two) times daily.   02/09/2015 at Unknown time  . nitroGLYCERIN (NITROSTAT) 0.4 MG SL tablet Place 0.4 mg under the tongue every 5 (five) minutes as needed. For chest pain   unknown  . RENVELA 800 MG tablet Take 1 tablet by mouth 3 (three) times daily with meals.    02/09/2015 at Unknown time  . atorvastatin (LIPITOR) 80 MG tablet Take 1 tablet (80 mg total) by mouth every evening. 30 tablet 3 Not Taking at Unknown time  . chlorpheniramine-HYDROcodone (TUSSIONEX PENNKINETIC ER) 10-8 MG/5ML SUER Take 5 mLs by mouth 2 (two) times daily. (Patient  not taking: Reported on 02/10/2015) 60 mL 0 Not Taking at Unknown time  . ondansetron (ZOFRAN) 4 MG tablet Take 1 tablet (4 mg total) by mouth every 8 (eight) hours as needed for nausea or vomiting. (Patient not taking: Reported on 02/10/2015) 15 tablet 0 Not Taking at Unknown time   Scheduled: . amLODipine  10 mg Oral Daily  . aspirin EC  81 mg Oral Daily  . atorvastatin  40 mg Oral QPM  . cinacalcet  30 mg Oral Q breakfast  . cloNIDine  0.3 mg Oral 3 times per day  . clopidogrel  75 mg Oral Q breakfast  . docusate sodium  100 mg Oral BID  . gabapentin  300 mg Oral QPM  . heparin  5,000 Units Subcutaneous 3 times per day  . hydrALAZINE  50 mg Oral 3 times per day  . insulin aspart  0-15 Units Subcutaneous TID WC  . insulin detemir  20 Units Subcutaneous QHS  . isosorbide mononitrate  60 mg Oral Daily  . labetalol  300 mg Oral 3 times per day  . lamoTRIgine  100 mg Oral QHS  . minoxidil  10 mg Oral BID  . sevelamer carbonate  800 mg Oral TID AC & HS  . sodium chloride  3 mL Intravenous Q12H  . sodium chloride  3 mL Intravenous Q12H  . sodium chloride  3 mL Intravenous Q12H   Continuous:   Results for orders placed or performed during the hospital encounter of 02/09/15 (from the past 48 hour(s))  Glucose, capillary     Status: Abnormal   Collection Time: 02/11/15 11:35 AM  Result Value Ref Range   Glucose-Capillary 151 (H)  65 - 99 mg/dL   Comment 1 Notify RN    Comment 2 Document in Chart   Glucose, capillary     Status: Abnormal   Collection Time: 02/11/15  4:16 PM  Result Value Ref Range   Glucose-Capillary 276 (H) 65 - 99 mg/dL   Comment 1 Notify RN    Comment 2 Document in Chart   Glucose, capillary     Status: Abnormal   Collection Time: 02/11/15  9:33 PM  Result Value Ref Range   Glucose-Capillary 215 (H) 65 - 99 mg/dL   Comment 1 Notify RN    Comment 2 Document in Chart   Glucose, capillary     Status: Abnormal   Collection Time: 02/12/15  7:34 AM  Result Value Ref Range   Glucose-Capillary 133 (H) 65 - 99 mg/dL   Comment 1 Notify RN    Comment 2 Document in Chart   Glucose, capillary     Status: Abnormal   Collection Time: 02/12/15 11:24 AM  Result Value Ref Range   Glucose-Capillary 117 (H) 65 - 99 mg/dL   Comment 1 Notify RN    Comment 2 Document in Chart   Glucose, capillary     Status: Abnormal   Collection Time: 02/12/15  4:13 PM  Result Value Ref Range   Glucose-Capillary 213 (H) 65 - 99 mg/dL   Comment 1 Notify RN    Comment 2 Document in Chart   Glucose, capillary     Status: Abnormal   Collection Time: 02/12/15 10:31 PM  Result Value Ref Range   Glucose-Capillary 245 (H) 65 - 99 mg/dL  Glucose, capillary     Status: Abnormal   Collection Time: 02/13/15  7:38 AM  Result Value Ref Range   Glucose-Capillary 212 (H) 65 - 99 mg/dL  Comment 1 Notify RN    Comment 2 Document in Chart     No results found.  Review of Systems  Constitutional: Negative for fever and chills.  Respiratory: Negative for shortness of breath and wheezing.   Cardiovascular: Negative for chest pain and palpitations.  Gastrointestinal: Negative for nausea and vomiting.  Musculoskeletal:       Right ankle pain  Neurological: Positive for tingling and sensory change.       Baseline bilateral lower extremity peripheral neuropathy   Blood pressure 132/57, pulse 79, temperature 97.3 F (36.3 C),  temperature source Oral, resp. rate 17, height 6\' 3"  (1.905 m), weight 108.4 kg (238 lb 15.7 oz), last menstrual period 01/09/2015, SpO2 97 %.  Body mass index is 29.87 kg/(m^2).  Physical Exam  Constitutional: She is cooperative. No distress.  Older appearing white female  Musculoskeletal:  Right lower extremity Inspection:   Soft dressing to right ankle   Ice packs to right ankle   No Gross deformities   Upon removal of the ace wrap there is extensive ecchymosis laterally, no open wounds or lesions   No acute findings noted to the right knee or right hip   Extensive callus formation noted on the plantar aspect of her feet bilaterally, callus is noted over the medial arch as well along the area of the navicular   No pressure sores or lesions noted over the plantar aspect of the foot or the heel bilaterally  Bony eval:   Exquisitely tender over her lateral malleolus and medial malleolus   Mild tenderness with palpation of her midfoot and forefoot   Knee and hip are nontender  Soft tissue:   Moderate swelling to the right ankle as well as extensive ecchymosis laterally   Skin does not wrinkle easily on the lateral aspect. Soft tissue in better condition over the medial malleolus. When compared to the contralateral side there is much less soft tissue elasticity on the right side   No right knee effusion appreciated   No knee instability with varus valgus stressing   No acute findings at the hip ROM:   Ankle range of motion not assessed   Patient can achieve full knee extension Sensation:    DPN, SPN sensation is intact    TN sensation intact but decreased at baseline     This is similar finding to the contralateral side Motor:   EHL, FHL and lesser toe motor functions intact   Did not have patient actively move her ankle   Active knee flexion and extension intact  Vascular:    Extremity warm    1+ DP pulse    Compartments soft, nontender    No pain out of proportion with  passive stretch   Neurological: She is alert.  Psychiatric: She has a normal mood and affect. Her speech is normal. Cognition and memory are normal.    Imaging   x-ray right ankle  Right trimalleolar ankle fracture  Avulsion off the fibular tip  Large nondisplaced/minimally displaced medial malleolus fracture  Small posterior malleolus fracture right tibia  Assessment/Plan:  39 year old white female status post ground level fall with right trimalleolar ankle fracture status post cardiac catheterization this admission  1. Ground level fall  2. Right trimalleolar ankle fracture  Ideally the this would be best treated with surgical intervention including ORIF of her fibula and medial malleolus. Do not believe that the posterior malleolus fragment is large enough to obtain adequate purchase   However patient swelling is  not appropriate enough to allow for safe surgical intervention and further heightened/exacerbated by her medical comorbidities including her poorly controlled diabetes and peripheral vascular disease. Patient is at increased risk for the development of infection, nonunion, amputation with surgery pursued    no surgery today   We'll have the patient immobilized in a posterior short leg splint. Aggressive ice and elevation at or above heart level. Patient encouraged to move toes as well.  Reevaluate soft tissue on Thursday  Possibility remains that we may treat this nonoperatively if her blood glucose levels remain elevated   I will also check ABIs to evaluate overall perfusion to her lower extremities well.  Check hemoglobin A1c as well   Patient has not worked with therapy at all during her admission thus far. 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 Renal disease  Nephrology  4. Cardiac disease  Cards  Status post cardiac  5. dispo  Surgery delayed  Will posterior  Thursday to hold spot and reevaluate soft tissue Thursday morning  PT and OT consults   Jari Pigg, PA-C [facility] [phone] (P) 02/13/2015, 9:41 AM

## 2015-02-13 NOTE — Progress Notes (Signed)
[facility] Progress Note  Assessment/Plan: 1. Fx S/P bimalleolar fracture of right ankle. Surgery postponed to Thursday  2. Cardiac, s/p cath - heart cath unremarkable 3. ESRD - MWF hd 4. HTN/volume - up 3kg, on 5 BP meds including minoxidil 5. Anemia - HGB 10.5  6. Metabolic bone disease - Ca 8.6, C Ca 8.92 Continue Sensipar and binders. 7. Nutrition - Clear liquids now. Advance to renal diet/carb mod today.  8. DM: Per primary  Janet Splinter MD (pgr) [phone]    ([phone] 02/13/2015, 10:00 AM   Subjective:  Says that surgery has been rescheduled to Thursday, due to "swelling".   Objective Filed Vitals:   02/12/15 1440 02/12/15 2019 02/13/15 0439 02/13/15 0900  BP: 130/61 152/63 142/57 132/57  Pulse: 84 84 77 79  Temp: 98.6 F (37 C) 97.8 F (36.6 C) 98.1 F (36.7 C) 97.3 F (36.3 C)  TempSrc: Oral Oral Oral Oral  Resp: 16 18 17    Height:      Weight:      SpO2: 95% 97% 98% 97%   Physical Exam General: well nourished, NAD Heart: RRR with S1 S2. II/VI systolic M. No rubs, or gallops appreciated. SR on monitor. HR 78. Lungs: Bilateral breath sounds CTA. Abdomen: abdomen soft, non-tender. Extremities:Walkin cast in place/posterior splint RLE. No edema. 2+ pulses.  Dialysis Access: LFA AVF +thrll + bruit.  Dialysis Orders: Center: [facility] on MWF . EDW 105 kg HD Bath 2.0K 2.0 Ca Time 4 hours Heparin 3000 units per tx. Access LFA AVF BFR 450 DFR Auto 1.5  Venofer 50 mg IV weekly (last dose 02/03/15)  Mircera 75 mcg IV q 2 weeks (last dose 02/01/15)   Additional Objective Labs: Basic Metabolic Panel:  Recent Labs Lab 02/09/15 2337 02/10/15 1417 02/11/15 0741  NA 135 135 134*  K 4.5 5.2* 4.4  CL 95* 98* 95*  CO2 26 25 27   GLUCOSE 320* 188* 161*  BUN 49* 49* 40*  CREATININE 8.38* 7.94* 7.35*  CALCIUM 8.6* 8.7* 9.0  PHOS  --  5.8*  --    Liver Function Tests:  Recent Labs Lab 02/09/15 2337 02/10/15 1417  AST 13*  --   ALT  11*  --   ALKPHOS 47  --   BILITOT 0.6  --   PROT 6.3*  --   ALBUMIN 3.6 3.3*   No results for input(s): LIPASE, AMYLASE in the last 168 hours. CBC:  Recent Labs Lab 02/09/15 2337 02/11/15 0741  WBC 9.6 6.1  NEUTROABS 6.9  --   HGB 10.4* 10.5*  HCT 32.2* 33.5*  MCV 97.3 96.8  PLT 141* 123*   Blood Culture    Component Value Date/Time   SDES BLOOD ARM RIGHT 10/16/2011 1037   SPECREQUEST BOTTLES DRAWN AEROBIC AND ANAEROBIC 10CC 10/16/2011 1037   CULT NO GROWTH 5 DAYS 10/16/2011 1037   REPTSTATUS 10/22/2011 FINAL 10/16/2011 1037    Cardiac Enzymes: No results for input(s): CKTOTAL, CKMB, CKMBINDEX, TROPONINI in the last 168 hours. CBG:  Recent Labs Lab 02/12/15 0734 02/12/15 1124 02/12/15 1613 02/12/15 2231 02/13/15 0738  GLUCAP 133* 117* 213* 245* 212*   Iron Studies: No results for input(s): IRON, TIBC, TRANSFERRIN, FERRITIN in the last 72 hours. @lablastinr3 @ Studies/Results: No results found. Medications:   . amLODipine  10 mg Oral Daily  . aspirin EC  81 mg Oral Daily  . atorvastatin  40 mg Oral QPM  . cinacalcet  30 mg Oral Q breakfast  . cloNIDine  0.3 mg Oral 3 times per day  . clopidogrel  75 mg Oral Q breakfast  . docusate sodium  100 mg Oral BID  . gabapentin  300 mg Oral QPM  . heparin  5,000 Units Subcutaneous 3 times per day  . hydrALAZINE  50 mg Oral 3 times per day  . insulin aspart  0-15 Units Subcutaneous TID WC  . insulin detemir  20 Units Subcutaneous QHS  . isosorbide mononitrate  60 mg Oral Daily  . labetalol  300 mg Oral 3 times per day  . lamoTRIgine  100 mg Oral QHS  . minoxidil  10 mg Oral BID  . sevelamer carbonate  800 mg Oral TID AC & HS  . sodium chloride  3 mL Intravenous Q12H  . sodium chloride  3 mL Intravenous Q12H  . sodium chloride  3 mL Intravenous Q12H

## 2015-02-13 NOTE — Progress Notes (Signed)
OT Cancellation Note    02/13/15 1600  OT Visit Information  Last OT Received On 02/13/15  Reason Eval/Treat Not Completed Patient at procedure or test/ unavailable  At HD  [facility], OTR/L  [phone] 02/13/2015

## 2015-02-13 NOTE — Care Management Important Message (Signed)
Important Message  Patient Details  Name: JAIRY ANGULO MRN: [id number] Date of Birth: June 18, 1975   Medicare Important Message Given:  Yes-second notification given    Nathen May 02/13/2015, 4:49 PM

## 2015-02-14 LAB — GLUCOSE, CAPILLARY
GLUCOSE-CAPILLARY: 115 mg/dL — AB (ref 65–99)
GLUCOSE-CAPILLARY: 138 mg/dL — AB (ref 65–99)
Glucose-Capillary: 260 mg/dL — ABNORMAL HIGH (ref 65–99)

## 2015-02-14 LAB — HEMOGLOBIN A1C
Hgb A1c MFr Bld: 8.8 % — ABNORMAL HIGH (ref 4.8–5.6)
Mean Plasma Glucose: 206 mg/dL

## 2015-02-14 LAB — POTASSIUM: Potassium: 5.1 mmol/L (ref 3.5–5.1)

## 2015-02-14 MED ORDER — DARBEPOETIN ALFA 60 MCG/0.3ML IJ SOSY
60.0000 ug | PREFILLED_SYRINGE | INTRAMUSCULAR | Status: DC
  Administered 2015-02-15: 60 ug via INTRAVENOUS
  Filled 2015-02-14: qty 0.3

## 2015-02-14 MED ORDER — HYDROMORPHONE HCL 1 MG/ML IJ SOLN
1.0000 mg | Freq: Once | INTRAMUSCULAR | Status: AC
  Administered 2015-02-14: 1 mg via INTRAVENOUS
  Filled 2015-02-14: qty 1

## 2015-02-14 MED ORDER — HYDROMORPHONE HCL 1 MG/ML IJ SOLN
1.0000 mg | INTRAMUSCULAR | Status: AC | PRN
  Administered 2015-02-15 – 2015-02-16 (×6): 1 mg via INTRAVENOUS
  Filled 2015-02-14 (×7): qty 1

## 2015-02-14 MED ORDER — CYCLOBENZAPRINE HCL 10 MG PO TABS: 5.0000 mg | ORAL_TABLET | Freq: Once | ORAL | Status: DC

## 2015-02-14 MED ORDER — DEXTROSE 5 % IV SOLN
500.0000 mg | Freq: Four times a day (QID) | INTRAVENOUS | Status: DC | PRN
  Administered 2015-02-14 – 2015-02-17 (×9): 500 mg via INTRAVENOUS
  Filled 2015-02-14 (×15): qty 5

## 2015-02-14 NOTE — Progress Notes (Signed)
PROGRESS NOTE  [name] KXF:[id number] DOB: 1975-06-11 DOA: 02/09/2015 PCP: Merrilee Seashore, MD  Assessment/Plan: Fall with right bimalleolar fracture - Patient is followed by orthopedic -surgery Thursday -leg elevation, apply ice, to help minimize her swelling, ABI was ordered to evaluate for PVD as well. - When necessary pain meds- requiring IV  History of coronary artery disease - By cardiology, recent cardiac cath with noncritical coronary artery disease - denies any chest pain or shortness of breath - she is on aspirin and plavix, Imdur statin and beta blockers  End-stage renal disease - seen by nephrology, on hemodialysis.  Hypertension - Blood pressure acceptable on Norvasc, clonidine, hydralazine, minoxidil, Imdur and labetalol  Diabetes mellitus - Continue with levemir and insulin sliding scale  Hyperlipidemia - Continue with statin  Code Status: full Family Communication: patient Disposition Plan: remain in hospital until surgery   Consultants:  Ortho  cardiology  Procedures:  cath    HPI/Subjective: Pain level tolerable at this time  Objective: Filed Vitals:   02/14/15 0600  BP: 152/60  Pulse: 87  Temp: 99 F (37.2 C)  Resp: 16    Intake/Output Summary (Last 24 hours) at 02/14/15 1039 Last data filed at 02/14/15 0900  Gross per 24 hour  Intake    420 ml  Output   1564 ml  Net  -1144 ml   Filed Weights   02/10/15 1708 02/13/15 1658 02/14/15 0500  Weight: 108.4 kg (238 lb 15.7 oz) 108.5 kg (239 lb 3.2 oz) 112 kg (246 lb 14.6 oz)    Exam:   General:  Awake, NAd-in chair  Cardiovascular: rrr  Respiratory: clear  Abdomen: +BS, soft  Musculoskeletal: leg elevated and wrapped   Data Reviewed: Basic Metabolic Panel:  Recent Labs Lab 02/09/15 2337 02/10/15 1417 02/11/15 0741 02/13/15 1427  NA 135 135 134* 133*  K 4.5 5.2* 4.4 5.9*  CL 95* 98* 95* 95*  CO2 26 25 27 24   GLUCOSE 320* 188* 161* 120*  BUN 49*  49* 40* 81*  CREATININE 8.38* 7.94* 7.35* 12.31*  CALCIUM 8.6* 8.7* 9.0 8.7*  PHOS  --  5.8*  --  9.8*   Liver Function Tests:  Recent Labs Lab 02/09/15 2337 02/10/15 1417 02/13/15 1427  AST 13*  --   --   ALT 11*  --   --   ALKPHOS 47  --   --   BILITOT 0.6  --   --   PROT 6.3*  --   --   ALBUMIN 3.6 3.3* 3.0*   No results for input(s): LIPASE, AMYLASE in the last 168 hours. No results for input(s): AMMONIA in the last 168 hours. CBC:  Recent Labs Lab 02/09/15 2337 02/11/15 0741 02/13/15 1427  WBC 9.6 6.1 7.0  NEUTROABS 6.9  --   --   HGB 10.4* 10.5* 8.7*  HCT 32.2* 33.5* 26.6*  MCV 97.3 96.8 94.0  PLT 141* 123* 121*   Cardiac Enzymes: No results for input(s): CKTOTAL, CKMB, CKMBINDEX, TROPONINI in the last 168 hours. BNP (last 3 results)  Recent Labs  10/30/14 1932  BNP 2024.0*    ProBNP (last 3 results) No results for input(s): PROBNP in the last 8760 hours.  CBG:  Recent Labs Lab 02/13/15 0738 02/13/15 1141 02/13/15 1803 02/13/15 2137 02/14/15 0741  GLUCAP 212* 105* 126* 166* 260*    No results found for this or any previous visit (from the past 240 hour(s)).   Studies: Dg Knee Right Port  02/13/2015   CLINICAL  DATA:  Injury January 2016.  Pain.  EXAM: PORTABLE RIGHT KNEE - 1-2 VIEW  COMPARISON:  None.  FINDINGS: New no acute bony or joint abnormality identified. No evidence of fracture dislocation. Osteopenia is noted. Atherosclerotic vascular calcification noted.  IMPRESSION: 1. No acute abnormality.  Diffuse osteopenia. 2. Peripheral vascular disease .   Electronically Signed   By: Marcello Moores  Register   On: 02/13/2015 12:29   Dg Foot Complete Right  02/13/2015   CLINICAL DATA:  Previous fracture. Throbbing, constant right ankle pain.  EXAM: RIGHT FOOT COMPLETE - 3+ VIEW  COMPARISON:  CT foot dated 09/08/2014.  FINDINGS: There has been interval healing at the previously noted fracture in the mid to distal right fifth metatarsal bone.  There is now  seen a slightly displaced fracture at the base of the right fifth metatarsal bone, appears new compared to the previous CT, with adjacent soft tissue swelling/edema.  No other osseous fracture or dislocation identified. No joint space malalignment. Extensive small vessel atherosclerotic calcifications are again seen throughout soft tissues of the right foot.  IMPRESSION: 1. New slightly displaced avulsion fracture at the base of the right fifth metatarsal bone with overlying soft tissue swelling/edema. 2. Interval healing of the fracture in the mid to distal right fifth metatarsal bone.   Electronically Signed   By: Franki Cabot M.D.   On: 02/13/2015 12:41    Scheduled Meds: . amLODipine  10 mg Oral Daily  . aspirin EC  81 mg Oral Daily  . atorvastatin  40 mg Oral QPM  . cinacalcet  30 mg Oral Q breakfast  . cloNIDine  0.3 mg Oral 3 times per day  . clopidogrel  75 mg Oral Q breakfast  . [START ON 02/15/2015] darbepoetin (ARANESP) injection - DIALYSIS  60 mcg Intravenous Q Wed-HD  . docusate sodium  100 mg Oral BID  . heparin  5,000 Units Subcutaneous 3 times per day  . hydrALAZINE  50 mg Oral 3 times per day  . insulin aspart  0-15 Units Subcutaneous TID WC  . insulin detemir  20 Units Subcutaneous QHS  . isosorbide mononitrate  60 mg Oral Daily  . labetalol  300 mg Oral 3 times per day  . lamoTRIgine  100 mg Oral QHS  . minoxidil  10 mg Oral BID  . sevelamer carbonate  800 mg Oral TID AC & HS  . sodium chloride  3 mL Intravenous Q12H  . sodium chloride  3 mL Intravenous Q12H  . sodium chloride  3 mL Intravenous Q12H   Continuous Infusions:  Antibiotics Given (last 72 hours)    None      Principal Problem:   Chest pain Active Problems:   Essential hypertension, benign   End stage renal disease on dialysis   Coronary artery disease   Hyperlipidemia   CAD (coronary artery disease)   Bimalleolar ankle fracture    Time spent: 25 min    Emilina Smarr  [facility] Pager [phone]. If 7PM-7AM, please contact night-coverage at [email or web address], password [facility] 02/14/2015, 10:39 AM  LOS: 5 days

## 2015-02-14 NOTE — Progress Notes (Signed)
[facility] Progress Note  Assessment/Plan: 1. Bimalleolar fracture of right ankle - Surgery postponed to Thursday. Having a great deal of pain with posterior splint. 2. Pre-op cardiac eval - heart cath unremarkable 3. ESRD - MWF At [facility]. For HD tomorrow. Hold heparin due to falling HGB. K+5.9. Will recheck this afternoon. 4. HTN/volume -HD yesterday Net UF 1564 Post wt.108.5 on 5 BP meds including minoxidil 5. Anemia - HGB 8.7. Start ESA with HD tomorrow. Monitor HGB . Check FOBT 6. Metabolic bone disease - Ca 8.6, C Ca 8.92 Continue Sensipar and binders. 7. Nutrition - Clear liquids now. Advance to renal diet/carb mod today.  8. DM: Per primary  Rita H. Brown NP-C 02/14/2015, 9:01 AM  [facility] [phone]  Pt seen, examined and agree w A/P as above.  Kelly Splinter MD pager [phone]    cell [phone] 02/14/2015, 12:47 PM    Subjective:  "My ankle is killing me". Denies Chest pain/SOB. othropedics called to adjust splint.  Objective Filed Vitals:   02/13/15 1735 02/13/15 2119 02/14/15 0500 02/14/15 0600  BP: 145/58 139/58  152/60  Pulse:  82  87  Temp:  98.7 F (37.1 C)  99 F (37.2 C)  TempSrc:  Oral  Oral  Resp:  20  16  Height:      Weight:   112 kg (246 lb 14.6 oz)   SpO2:  96%  92%   General: well nourished, NAD Heart: RRR with S1 S2. II/VI systolic M. No rubs, or gallops appreciated. SR on monitor. HR 78. Lungs: Bilateral breath sounds CTA. Abdomen: abdomen soft, non-tender active BS. Extremitiesposterior splint RLE. No edema. 2+ pulses.  Dialysis Access: LFA AVF +thrll + bruit.  Dialysis Orders: Center: [facility] on MWF . EDW 105 kg HD Bath 2.0K 2.0 Ca Time 4 hours Heparin 3000 units per tx. Access LFA AVF BFR 450 DFR Auto 1.5  Venofer 50 mg IV weekly (last dose 02/03/15)  Mircera 75 mcg IV q 2 weeks (last dose 02/01/15) Additional Objective Labs: Basic Metabolic Panel:  Recent Labs Lab 02/10/15 1417 02/11/15 0741  02/13/15 1427  NA 135 134* 133*  K 5.2* 4.4 5.9*  CL 98* 95* 95*  CO2 25 27 24   GLUCOSE 188* 161* 120*  BUN 49* 40* 81*  CREATININE 7.94* 7.35* 12.31*  CALCIUM 8.7* 9.0 8.7*  PHOS 5.8*  --  9.8*   Liver Function Tests:  Recent Labs Lab 02/09/15 2337 02/10/15 1417 02/13/15 1427  AST 13*  --   --   ALT 11*  --   --   ALKPHOS 47  --   --   BILITOT 0.6  --   --   PROT 6.3*  --   --   ALBUMIN 3.6 3.3* 3.0*   No results for input(s): LIPASE, AMYLASE in the last 168 hours. CBC:  Recent Labs Lab 02/09/15 2337 02/11/15 0741 02/13/15 1427  WBC 9.6 6.1 7.0  NEUTROABS 6.9  --   --   HGB 10.4* 10.5* 8.7*  HCT 32.2* 33.5* 26.6*  MCV 97.3 96.8 94.0  PLT 141* 123* 121*   Blood Culture    Component Value Date/Time   SDES BLOOD ARM RIGHT 10/16/2011 1037   SPECREQUEST BOTTLES DRAWN AEROBIC AND ANAEROBIC 10CC 10/16/2011 1037   CULT NO GROWTH 5 DAYS 10/16/2011 1037   REPTSTATUS 10/22/2011 FINAL 10/16/2011 1037    Cardiac Enzymes: No results for input(s): CKTOTAL, CKMB, CKMBINDEX, TROPONINI in the last 168 hours. CBG:  Recent Labs  Lab 02/13/15 0738 02/13/15 1141 02/13/15 1803 02/13/15 2137 02/14/15 0741  GLUCAP 212* 105* 126* 166* 260*   Iron Studies: No results for input(s): IRON, TIBC, TRANSFERRIN, FERRITIN in the last 72 hours. @lablastinr3 @ Studies/Results: Dg Knee Right Port  02/13/2015   CLINICAL DATA:  Injury January 2016.  Pain.  EXAM: PORTABLE RIGHT KNEE - 1-2 VIEW  COMPARISON:  None.  FINDINGS: New no acute bony or joint abnormality identified. No evidence of fracture dislocation. Osteopenia is noted. Atherosclerotic vascular calcification noted.  IMPRESSION: 1. No acute abnormality.  Diffuse osteopenia. 2. Peripheral vascular disease .   Electronically Signed   By: Marcello Moores  Register   On: 02/13/2015 12:29   Dg Foot Complete Right  02/13/2015   CLINICAL DATA:  Previous fracture. Throbbing, constant right ankle pain.  EXAM: RIGHT FOOT COMPLETE - 3+ VIEW   COMPARISON:  CT foot dated 09/08/2014.  FINDINGS: There has been interval healing at the previously noted fracture in the mid to distal right fifth metatarsal bone.  There is now seen a slightly displaced fracture at the base of the right fifth metatarsal bone, appears new compared to the previous CT, with adjacent soft tissue swelling/edema.  No other osseous fracture or dislocation identified. No joint space malalignment. Extensive small vessel atherosclerotic calcifications are again seen throughout soft tissues of the right foot.  IMPRESSION: 1. New slightly displaced avulsion fracture at the base of the right fifth metatarsal bone with overlying soft tissue swelling/edema. 2. Interval healing of the fracture in the mid to distal right fifth metatarsal bone.   Electronically Signed   By: Franki Cabot M.D.   On: 02/13/2015 12:41   Medications:   . amLODipine  10 mg Oral Daily  . aspirin EC  81 mg Oral Daily  . atorvastatin  40 mg Oral QPM  . cinacalcet  30 mg Oral Q breakfast  . cloNIDine  0.3 mg Oral 3 times per day  . clopidogrel  75 mg Oral Q breakfast  . docusate sodium  100 mg Oral BID  . heparin  5,000 Units Subcutaneous 3 times per day  . hydrALAZINE  50 mg Oral 3 times per day  . insulin aspart  0-15 Units Subcutaneous TID WC  . insulin detemir  20 Units Subcutaneous QHS  . isosorbide mononitrate  60 mg Oral Daily  . labetalol  300 mg Oral 3 times per day  . lamoTRIgine  100 mg Oral QHS  . minoxidil  10 mg Oral BID  . sevelamer carbonate  800 mg Oral TID AC & HS  . sodium chloride  3 mL Intravenous Q12H  . sodium chloride  3 mL Intravenous Q12H  . sodium chloride  3 mL Intravenous Q12H

## 2015-02-14 NOTE — Evaluation (Signed)
Physical Therapy Evaluation Patient Details Name: MACARENA LANGSETH MRN: [id number] DOB: 10-28-1975 Today's Date: 02/14/2015   History of Present Illness  39 year old female with ESRD, PVD, CAD, DM, II and recent bimalleolar fracture of right ankle.  Clinical Impression  Pt presented with severe R ankle and posterior crural region pain upon initiation of evaluation, and was also TTP over R pes anserine.  Mobility eval limited due to severe pain throughout the day with multiple changes in dressing to right ankle.  Patient educated in therex for bilat LE's, will continue assessment as tolerated. Pt presents with deficits in PT problem list below. Pt would benefit from skilled Acute Physical Therapy to decrease pain and improve strength to improve functional mobility for d/c to home health PT.    Follow Up Recommendations Home health PT    Equipment Recommendations  None recommended by PT    Recommendations for Other Services       Precautions / Restrictions Restrictions Weight Bearing Restrictions: Yes RLE Weight Bearing: Non weight bearing      Mobility  Bed Mobility               General bed mobility comments: visualized pt repositioning in bed, but no formal assessment due to pain  Transfers                    Ambulation/Gait                Stairs            Wheelchair Mobility    Modified Rankin (Stroke Patients Only)       Balance                                             Pertinent Vitals/Pain Pain Assessment: Faces Faces Pain Scale: Hurts worst Pain Location: R ankle and crural region Pain Intervention(s): Monitored during session;Ice applied;Other (comment) (Monitored pain medication regiment)    Home Living Family/patient expects to be discharged to:: Private residence Living Arrangements: Spouse/significant other;Children Available Help at Discharge: Family;Available 24 hours/day Type of Home:  Apartment Home Access: Level entry     Home Layout: One level Home Equipment: Cane - single point;Wheelchair - manual      Prior Function Level of Independence: Needs assistance               Hand Dominance        Extremity/Trunk Assessment                         Communication      Cognition Arousal/Alertness: Awake/alert Behavior During Therapy: WFL for tasks assessed/performed Overall Cognitive Status: Within Functional Limits for tasks assessed                      General Comments      Exercises Other Exercises Other Exercises: Educated patient on exercises as follows, right toe flex/ext, quad set, SLR and left single leg bridging.  Attempted to demonstrate quad set on right, but limited due to pain      Assessment/Plan    PT Assessment Patient needs continued PT services  PT Diagnosis Acute pain;Difficulty walking;Generalized weakness (Visible atrophy in R LE, MMT not formally assessed due to patients current pain level.)   PT Problem List Decreased strength;Decreased range of  motion;Decreased activity tolerance;Pain;Impaired sensation  PT Treatment Interventions DME instruction;Gait training;Functional mobility training;Therapeutic activities;Therapeutic exercise;Neuromuscular re-education;Patient/family education;Modalities;Wheelchair mobility training   PT Goals (Current goals can be found in the Care Plan section) Acute Rehab PT Goals Patient Stated Goal: Not Stated PT Goal Formulation: With patient Time For Goal Achievement: 02/28/15 Potential to Achieve Goals: Fair    Frequency     Barriers to discharge        Co-evaluation               End of Session   Activity Tolerance: Patient limited by pain Patient left: in bed;with call bell/phone within reach;with family/visitor present           Time: 0051-1021 PT Time Calculation (min) (ACUTE ONLY): 15 min   Charges:   PT Evaluation $Initial PT Evaluation Tier  I: 1 Procedure     PT G Codes:        WYNN,CYNDI 02/25/15, 4:03 PM  Magda Kiel, [place] 2015-02-25

## 2015-02-14 NOTE — Progress Notes (Signed)
Orthopedic Tech Progress Note Patient Details:  [name] 1975/11/17 [id number]  Ortho Devices Type of Ortho Device: Ace wrap, Post (short leg) splint Splint Material: Fiberglass Ortho Device/Splint Location: replacement splint due to swelling Ortho Device/Splint Interventions: Application   [name], [name] 02/14/2015, 11:10 AM

## 2015-02-14 NOTE — Progress Notes (Signed)
Pt growing hysterical and crying from pain in right leg. Threatening to remove splint herself. Gave PRN Dilaudid, pt calmed down slightly, still distraught. Paged [facility] on-call to see if muscle relaxers possible

## 2015-02-14 NOTE — Progress Notes (Signed)
OT Cancellation Note  Patient Details Name: [name] MRN: [id number] DOB: 1975/06/23   Cancelled Treatment:    Reason Eval/Treat Not Completed: Pain limiting ability to participate. Patient up in recliner upon OT arrival. Patient reports she had just gotten into recliner is experiencing too much pain to do any therapy at this moment.   OT obtained home living/PLOF information.  Will re-attempt later as time allows.    Darrol Jump OTR/L

## 2015-02-14 NOTE — Progress Notes (Signed)
Orthopedic Tech Progress Note Patient Details:  [name] 21-Apr-1976 [id number] Pt. c/o "severe discomfort" in RLE around mid-calf.   Removed existing fiberglass posterior short leg splint, added cast padding and re-applied the original, well-molded fiberglass splint.  Pt. stated "That feels a little better."  Pulses, sensation, motion intact before and after adjustment of splint.  Capillary refill less than 2 seconds before and  after adjustment of splint. Patient ID: [name], female   DOB: 11-Jun-1975, 39 y.o.   MRN: [id number]   Darrol Poke 02/14/2015, 1:25 PM

## 2015-02-15 LAB — RENAL FUNCTION PANEL
ALBUMIN: 3 g/dL — AB (ref 3.5–5.0)
ANION GAP: 12 (ref 5–15)
Albumin: 3 g/dL — ABNORMAL LOW (ref 3.5–5.0)
Anion gap: 14 (ref 5–15)
BUN: 62 mg/dL — ABNORMAL HIGH (ref 6–20)
BUN: 28 mg/dL — ABNORMAL HIGH (ref 6–20)
CALCIUM: 8.6 mg/dL — AB (ref 8.9–10.3)
CO2: 23 mmol/L (ref 22–32)
CO2: 30 mmol/L (ref 22–32)
Calcium: 9.1 mg/dL (ref 8.9–10.3)
Chloride: 95 mmol/L — ABNORMAL LOW (ref 101–111)
Chloride: 96 mmol/L — ABNORMAL LOW (ref 101–111)
Creatinine, Ser: 11.04 mg/dL — ABNORMAL HIGH (ref 0.44–1.00)
Creatinine, Ser: 6.44 mg/dL — ABNORMAL HIGH (ref 0.44–1.00)
GFR calc Af Amer: 4 mL/min — ABNORMAL LOW (ref >60)
GFR calc non Af Amer: 4 mL/min — ABNORMAL LOW (ref >60)
GFR, EST AFRICAN AMERICAN: 9 mL/min — AB (ref >60)
GFR, EST NON AFRICAN AMERICAN: 7 mL/min — AB (ref >60)
GLUCOSE: 54 mg/dL — AB (ref 65–99)
Glucose, Bld: 200 mg/dL — ABNORMAL HIGH (ref 65–99)
PHOSPHORUS: 4.9 mg/dL — AB (ref 2.5–4.6)
POTASSIUM: 3.9 mmol/L (ref 3.5–5.1)
Phosphorus: 9.3 mg/dL — ABNORMAL HIGH (ref 2.5–4.6)
Potassium: 5.4 mmol/L — ABNORMAL HIGH (ref 3.5–5.1)
SODIUM: 138 mmol/L (ref 135–145)
Sodium: 132 mmol/L — ABNORMAL LOW (ref 135–145)

## 2015-02-15 LAB — GLUCOSE, CAPILLARY
GLUCOSE-CAPILLARY: 227 mg/dL — AB (ref 65–99)
GLUCOSE-CAPILLARY: 80 mg/dL (ref 65–99)
Glucose-Capillary: 108 mg/dL — ABNORMAL HIGH (ref 65–99)
Glucose-Capillary: 227 mg/dL — ABNORMAL HIGH (ref 65–99)

## 2015-02-15 LAB — CBC
HCT: 28.2 % — ABNORMAL LOW (ref 36.0–46.0)
Hemoglobin: 9 g/dL — ABNORMAL LOW (ref 12.0–15.0)
MCH: 30.2 pg (ref 26.0–34.0)
MCHC: 31.9 g/dL (ref 30.0–36.0)
MCV: 94.6 fL (ref 78.0–100.0)
Platelets: 137 10*3/uL — ABNORMAL LOW (ref 150–400)
RBC: 2.98 MIL/uL — ABNORMAL LOW (ref 3.87–5.11)
RDW: 15.5 % (ref 11.5–15.5)
WBC: 4 K/uL (ref 4.0–10.5)

## 2015-02-15 LAB — PROTIME-INR
INR: 1.14 (ref 0.00–1.49)
Prothrombin Time: 14.8 seconds (ref 11.6–15.2)

## 2015-02-15 LAB — APTT: aPTT: 53 seconds — ABNORMAL HIGH (ref 24–37)

## 2015-02-15 MED ORDER — LIDOCAINE HCL (PF) 1 % IJ SOLN: 5.0000 mL | INTRAMUSCULAR | Status: DC | PRN

## 2015-02-15 MED ORDER — HEPARIN SODIUM (PORCINE) 1000 UNIT/ML DIALYSIS: 1000.0000 [IU] | INTRAMUSCULAR | Status: DC | PRN

## 2015-02-15 MED ORDER — HEPARIN SODIUM (PORCINE) 1000 UNIT/ML DIALYSIS
3000.0000 [IU] | Freq: Once | INTRAMUSCULAR | Status: DC
  Filled 2015-02-15: qty 3

## 2015-02-15 MED ORDER — SEVELAMER CARBONATE 800 MG PO TABS
1600.0000 mg | ORAL_TABLET | Freq: Three times a day (TID) | ORAL | Status: DC
  Administered 2015-02-15 – 2015-02-17 (×3): 1600 mg via ORAL
  Filled 2015-02-15 (×4): qty 2

## 2015-02-15 MED ORDER — ALTEPLASE 2 MG IJ SOLR
2.0000 mg | Freq: Once | INTRAMUSCULAR | Status: DC | PRN
  Filled 2015-02-15: qty 2

## 2015-02-15 MED ORDER — HEPARIN SODIUM (PORCINE) 1000 UNIT/ML DIALYSIS
1000.0000 [IU] | INTRAMUSCULAR | Status: DC | PRN
  Filled 2015-02-15: qty 1

## 2015-02-15 MED ORDER — SODIUM CHLORIDE 0.9 % IV SOLN
100.0000 mL | INTRAVENOUS | Status: DC | PRN
Start: 2015-02-15 — End: 2015-02-17

## 2015-02-15 MED ORDER — PENTAFLUOROPROP-TETRAFLUOROETH EX AERO: 1.0000 "application " | INHALATION_SPRAY | CUTANEOUS | Status: DC | PRN

## 2015-02-15 MED ORDER — ALTEPLASE 2 MG IJ SOLR: 2.0000 mg | Freq: Once | INTRAMUSCULAR | Status: DC | PRN

## 2015-02-15 MED ORDER — SODIUM CHLORIDE 0.9 % IV SOLN: 100.0000 mL | INTRAVENOUS | Status: DC | PRN

## 2015-02-15 MED ORDER — DARBEPOETIN ALFA 60 MCG/0.3ML IJ SOSY
PREFILLED_SYRINGE | INTRAMUSCULAR | Status: AC
  Administered 2015-02-15: 60 ug via INTRAVENOUS
  Filled 2015-02-15: qty 0.3

## 2015-02-15 MED ORDER — CLINDAMYCIN PHOSPHATE 900 MG/50ML IV SOLN
900.0000 mg | INTRAVENOUS | Status: AC
  Administered 2015-02-16: 900 mg via INTRAVENOUS
  Filled 2015-02-15: qty 50

## 2015-02-15 MED ORDER — CHLORHEXIDINE GLUCONATE 4 % EX LIQD
60.0000 mL | Freq: Once | CUTANEOUS | Status: AC
  Administered 2015-02-16: 4 via TOPICAL
  Filled 2015-02-15: qty 60

## 2015-02-15 MED ORDER — SODIUM CHLORIDE 0.9 % IV SOLN
100.0000 mL | INTRAVENOUS | Status: DC | PRN
Start: 2015-02-15 — End: 2015-02-18

## 2015-02-15 MED ORDER — LIDOCAINE-PRILOCAINE 2.5-2.5 % EX CREA
1.0000 "application " | TOPICAL_CREAM | CUTANEOUS | Status: DC | PRN
  Filled 2015-02-15: qty 5

## 2015-02-15 MED ORDER — LIDOCAINE-PRILOCAINE 2.5-2.5 % EX CREA: 1.0000 "application " | TOPICAL_CREAM | CUTANEOUS | Status: DC | PRN

## 2015-02-15 NOTE — Progress Notes (Signed)
PT Cancellation Note  Patient Details Name: [name] MRN: [id number] DOB: 1976-03-07   Cancelled Treatment:    Reason Eval/Treat Not Completed: Pain limiting ability to participate; continues to feel she cannot tolerate dangling foot off bed for OOB transfer today.  Reports may begin nerve pain medicine and have a break from the splint, but is scheduled for surgery tomorrow.  Will cancel due to complaints and plan to continue/re-eval after surgery.   WYNN,CYNDI 02/15/2015, 2:31 PM

## 2015-02-15 NOTE — Care Management Note (Signed)
Case Management Note  Patient Details  Name: [name] MRN: [id number] Date of Birth: 1976/03/15  Subjective/Objective:  Pt admitted for Fall with right bimalleolar fracture- Patient is followed by orthopedic- plan for surgery Thursday 02-16-15.          Action/Plan: CM to monitor for disposition needs post procedure.    Expected Discharge Date:                  Expected Discharge Plan:  [place]  In-House Referral:  Clinical Social Work  Discharge planning Services  CM Consult  Post Acute Care Choice:    Choice offered to:     DME Arranged:    DME Agency:     HH Arranged:    [place] Agency:     Status of Service:  In process, will continue to follow  Medicare Important Message Given:  Yes-second notification given Date Medicare IM Given:    Medicare IM give by:    Date Additional Medicare IM Given:    Additional Medicare Important Message give by:     If discussed at [place] of Stay Meetings, dates discussed:    Additional Comments:  Bethena Roys, RN 02/15/2015, 11:37 AM

## 2015-02-15 NOTE — Progress Notes (Signed)
[facility] Progress Note  Assessment: 1. Bimalleolar fracture of right ankle - for surgery tomorrow 2. Pre-op cardiac eval - heart cath showed non-obstructive CAD 3. ESRD - MWF hd 4. HTN/volume -HD yesterday Net UF 1564 Post wt.108.5 on 5 BP meds including minoxidil 5. Anemia - HGB 8.7. Start ESA with HD tomorrow. Monitor HGB . Check FOBT 6. Metabolic bone disease - Ca 8.6, C Ca 8.92 Continue Sensipar and binders. 7. DM: Per primary  Plan -  HD today. Surgery tomorrow.   Kelly Splinter MD pager [phone]    cell [phone] 02/15/2015, 11:47 AM    Subjective:  No complaints  Objective Filed Vitals:   02/15/15 0930 02/15/15 1000 02/15/15 1018 02/15/15 1101  BP: 139/65 130/68 148/69 126/61  Pulse: 71 71 76   Temp:   98 F (36.7 C)   TempSrc:   Oral   Resp:    18  Height:      Weight:      SpO2:   95%    General: well nourished, NAD Heart: RRR with S1 S2. II/VI systolic M. No rubs, or gallops appreciated. SR on monitor. HR 78. Lungs: Bilateral breath sounds CTA. Abdomen: abdomen soft, non-tender active BS. Extremitiesposterior splint RLE. No edema. 2+ pulses.  Dialysis Access: LFA AVF +thrll + bruit.  Dialysis Orders: Center: [facility] on MWF . EDW 105 kg HD Bath 2.0K 2.0 Ca Time 4 hours Heparin 3000 units per tx. Access LFA AVF BFR 450 DFR Auto 1.5  Venofer 50 mg IV weekly (last dose 02/03/15)  Mircera 75 mcg IV q 2 weeks (last dose 02/01/15) Additional Objective Labs: Basic Metabolic Panel:  Recent Labs Lab 02/10/15 1417 02/11/15 0741 02/13/15 1427 02/14/15 1523 02/15/15 0738  NA 135 134* 133*  --  132*  K 5.2* 4.4 5.9* 5.1 5.4*  CL 98* 95* 95*  --  95*  CO2 25 27 24   --  23  GLUCOSE 188* 161* 120*  --  200*  BUN 49* 40* 81*  --  62*  CREATININE 7.94* 7.35* 12.31*  --  11.04*  CALCIUM 8.7* 9.0 8.7*  --  9.1  PHOS 5.8*  --  9.8*  --  9.3*   Liver Function Tests:  Recent Labs Lab 02/09/15 2337 02/10/15 1417 02/13/15 1427  02/15/15 0738  AST 13*  --   --   --   ALT 11*  --   --   --   ALKPHOS 47  --   --   --   BILITOT 0.6  --   --   --   PROT 6.3*  --   --   --   ALBUMIN 3.6 3.3* 3.0* 3.0*   No results for input(s): LIPASE, AMYLASE in the last 168 hours. CBC:  Recent Labs Lab 02/09/15 2337 02/11/15 0741 02/13/15 1427  WBC 9.6 6.1 7.0  NEUTROABS 6.9  --   --   HGB 10.4* 10.5* 8.7*  HCT 32.2* 33.5* 26.6*  MCV 97.3 96.8 94.0  PLT 141* 123* 121*   Blood Culture    Component Value Date/Time   SDES BLOOD ARM RIGHT 10/16/2011 1037   SPECREQUEST BOTTLES DRAWN AEROBIC AND ANAEROBIC 10CC 10/16/2011 1037   CULT NO GROWTH 5 DAYS 10/16/2011 1037   REPTSTATUS 10/22/2011 FINAL 10/16/2011 1037    Cardiac Enzymes: No results for input(s): CKTOTAL, CKMB, CKMBINDEX, TROPONINI in the last 168 hours. CBG:  Recent Labs Lab 02/14/15 0741 02/14/15 1133 02/14/15 1605 02/14/15 2041 02/15/15 1136  GLUCAP  260* 115* 138* 227* 108*   Iron Studies: No results for input(s): IRON, TIBC, TRANSFERRIN, FERRITIN in the last 72 hours. @lablastinr3 @ Studies/Results: No results found. Medications:   . amLODipine  10 mg Oral Daily  . aspirin EC  81 mg Oral Daily  . atorvastatin  40 mg Oral QPM  . cinacalcet  30 mg Oral Q breakfast  . [START ON 02/16/2015] clindamycin (CLEOCIN) IV  900 mg Intravenous On Call to OR  . cloNIDine  0.3 mg Oral 3 times per day  . clopidogrel  75 mg Oral Q breakfast  . darbepoetin (ARANESP) injection - DIALYSIS  60 mcg Intravenous Q Wed-HD  . docusate sodium  100 mg Oral BID  . heparin  5,000 Units Subcutaneous 3 times per day  . hydrALAZINE  50 mg Oral 3 times per day  . insulin aspart  0-15 Units Subcutaneous TID WC  . insulin detemir  20 Units Subcutaneous QHS  . isosorbide mononitrate  60 mg Oral Daily  . labetalol  300 mg Oral 3 times per day  . lamoTRIgine  100 mg Oral QHS  . minoxidil  10 mg Oral BID  . sevelamer carbonate  800 mg Oral TID AC & HS  . sodium chloride  3 mL  Intravenous Q12H  . sodium chloride  3 mL Intravenous Q12H  . sodium chloride  3 mL Intravenous Q12H

## 2015-02-15 NOTE — Anesthesia Preprocedure Evaluation (Addendum)
Anesthesia Evaluation  Patient identified by MRN, date of birth, ID band Patient awake    Reviewed: Allergy & Precautions, NPO status , Patient's Chart, lab work & pertinent test results  Airway Mallampati: II  TM Distance: >3 FB Neck ROM: Full    Dental no notable dental hx. (+) Teeth Intact, Dental Advisory Given   Pulmonary neg pulmonary ROS, former smoker,    Pulmonary exam normal breath sounds clear to auscultation       Cardiovascular hypertension, Pt. on medications and Pt. on home beta blockers + CAD and + Peripheral Vascular Disease  Normal cardiovascular exam Rhythm:Regular Rate:Normal     Neuro/Psych negative neurological ROS  negative psych ROS   GI/Hepatic negative GI ROS, Neg liver ROS,   Endo/Other  diabetes, Poorly Controlled, Type 2, Oral Hypoglycemic Agents, Insulin Dependent  Renal/GU ESRF and Renal InsufficiencyRenal diseaseM-Wed-Fr via left arm AVF  negative genitourinary   Musculoskeletal negative musculoskeletal ROS (+)   Abdominal   Peds  Hematology negative hematology ROS (+) anemia ,   Anesthesia Other Findings   Reproductive/Obstetrics negative OB ROS                           Anesthesia Physical Anesthesia Plan  ASA: II  Anesthesia Plan: Regional and MAC   Post-op Pain Management:    Induction: Intravenous  Airway Management Planned:   Additional Equipment:   Intra-op Plan:   Post-operative Plan:   Informed Consent: I have reviewed the patients History and Physical, chart, labs and discussed the procedure including the risks, benefits and alternatives for the proposed anesthesia with the patient or authorized representative who has indicated his/her understanding and acceptance.   Dental advisory given  Plan Discussed with: CRNA  Anesthesia Plan Comments:         Anesthesia Quick Evaluation

## 2015-02-15 NOTE — Progress Notes (Signed)
OT cancellation    02/15/15 0831  OT Visit Information  Last OT Received On 02/15/15  Reason Eval/Treat Not Completed Patient at procedure or test/ unavailable. Pt at HD.   Roseanne Reno, OTR/L [phone]

## 2015-02-15 NOTE — Progress Notes (Signed)
Orthopedic Tech Progress Note Patient Details:  [name] 1975-11-18 [id number] Adjusted splint for pt.'s comfort. Patient ID: [name], female   DOB: [birth year], 39 y.o.   MRN: [id number]   Darrol Poke 02/15/2015, 3:32 PM

## 2015-02-15 NOTE — Progress Notes (Signed)
Orthopaedic Trauma Service (OTS)  Subjective: C/o burning pain right leg; splint change; in dialysis  Objective: Sugar at an even 200 which is great improvement  Physical Exam RLE skin now wrinkling in anticipated area of incision  Assessment/Plan: PLAN: ORIF tomorrow 8am  I discussed with the patient the risks and benefits of surgery, including the possibility of infection, stroke, MI, and death, in addition to nerve injury, vessel injury, wound breakdown, arthritis, symptomatic hardware, DVT/ PE, exacerbation of neuropathic pain, loss of motion, and need for further surgery among others.  We also specifically discussed the elevated risks of these given her dialysis and poorly controlled diabetes.She understood these risks and wished to proceed.  Altamese Tumacacori-Carmen, MD [facility] [phone] (p)

## 2015-02-15 NOTE — Progress Notes (Addendum)
PROGRESS NOTE  [name] HCW:[id number] DOB: 1975/06/04 DOA: 02/09/2015 PCP: Merrilee Seashore, MD  [name] is a 39 y.o. female, with past medical history of diabetes mellitus, coronary artery disease, peripheral neuropathy, end-stage renal disease on hemodialysis, peripheral vascular disease, consistent ground-level fall on 02/09/2015, with bimalleolar right ankle fracture, admitted by Dr. Oralia Rud for cardiac cath, as part of preop clearance, as patient was planned for cardiac cath 9/27 , admitted for cardiac cath on 9/16 significant for noncritical coronary artery disease with normal LV systolic function, the plan by orthopedic for surgical repair currently on hold secondary to significant ankle edema, patient is been followed by nephrology service for hemodialysis, seen by orthopedic with plan for surgery this coming Thursday, cardiology consulted hospitalist service to resume care and given no cardiac issues, and patient completely cured medical history.   Assessment/Plan: Fall with right bimalleolar fracture - Patient is followed by orthopedic -surgery Thursday -leg elevation, apply ice, to help minimize her swelling, ABI was ordered to evaluate for PVD as well. - When necessary pain meds- requiring IV  History of coronary artery disease - By cardiology, recent cardiac cath with noncritical coronary artery disease - denies any chest pain or shortness of breath - she is on aspirin and plavix, Imdur statin and beta blockers  End-stage renal disease - seen by nephrology, on hemodialysis.  Hypertension - Blood pressure acceptable on Norvasc, clonidine, hydralazine, minoxidil, Imdur and labetalol  Diabetes mellitus - Continue with levemir and insulin sliding scale  Hyperlipidemia - Continue with statin  Code Status: full Family Communication: patient Disposition Plan: remain in hospital until surgery  Thursday   Consultants:  Ortho  cardiology  Procedures:  cath    HPI/Subjective: Pain level tolerable at this time  Objective: Filed Vitals:   02/15/15 1101  BP: 126/61  Pulse:   Temp:   Resp: 18    Intake/Output Summary (Last 24 hours) at 02/15/15 1242 Last data filed at 02/15/15 1018  Gross per 24 hour  Intake      0 ml  Output   2036 ml  Net  -2036 ml   Filed Weights   02/14/15 0500 02/15/15 0425 02/15/15 0658  Weight: 112 kg (246 lb 14.6 oz) 112.7 kg (248 lb 7.3 oz) 107.8 kg (237 lb 10.5 oz)    Exam:   General:  Awake, NAd-in chair  Cardiovascular: rrr  Respiratory: clear  Abdomen: +BS, soft  Musculoskeletal: leg elevated and wrapped   Data Reviewed: Basic Metabolic Panel:  Recent Labs Lab 02/09/15 2337 02/10/15 1417 02/11/15 0741 02/13/15 1427 02/14/15 1523 02/15/15 0738  NA 135 135 134* 133*  --  132*  K 4.5 5.2* 4.4 5.9* 5.1 5.4*  CL 95* 98* 95* 95*  --  95*  CO2 26 25 27 24   --  23  GLUCOSE 320* 188* 161* 120*  --  200*  BUN 49* 49* 40* 81*  --  62*  CREATININE 8.38* 7.94* 7.35* 12.31*  --  11.04*  CALCIUM 8.6* 8.7* 9.0 8.7*  --  9.1  PHOS  --  5.8*  --  9.8*  --  9.3*   Liver Function Tests:  Recent Labs Lab 02/09/15 2337 02/10/15 1417 02/13/15 1427 02/15/15 0738  AST 13*  --   --   --   ALT 11*  --   --   --   ALKPHOS 47  --   --   --   BILITOT 0.6  --   --   --  PROT 6.3*  --   --   --   ALBUMIN 3.6 3.3* 3.0* 3.0*   No results for input(s): LIPASE, AMYLASE in the last 168 hours. No results for input(s): AMMONIA in the last 168 hours. CBC:  Recent Labs Lab 02/09/15 2337 02/11/15 0741 02/13/15 1427  WBC 9.6 6.1 7.0  NEUTROABS 6.9  --   --   HGB 10.4* 10.5* 8.7*  HCT 32.2* 33.5* 26.6*  MCV 97.3 96.8 94.0  PLT 141* 123* 121*   Cardiac Enzymes: No results for input(s): CKTOTAL, CKMB, CKMBINDEX, TROPONINI in the last 168 hours. BNP (last 3 results)  Recent Labs  10/30/14 1932  BNP 2024.0*    ProBNP  (last 3 results) No results for input(s): PROBNP in the last 8760 hours.  CBG:  Recent Labs Lab 02/14/15 0741 02/14/15 1133 02/14/15 1605 02/14/15 2041 02/15/15 1136  GLUCAP 260* 115* 138* 227* 108*    No results found for this or any previous visit (from the past 240 hour(s)).   Studies: No results found.  Scheduled Meds: . amLODipine  10 mg Oral Daily  . aspirin EC  81 mg Oral Daily  . atorvastatin  40 mg Oral QPM  . chlorhexidine  60 mL Topical Once  . cinacalcet  30 mg Oral Q breakfast  . [START ON 02/16/2015] clindamycin (CLEOCIN) IV  900 mg Intravenous On Call to OR  . cloNIDine  0.3 mg Oral 3 times per day  . clopidogrel  75 mg Oral Q breakfast  . darbepoetin (ARANESP) injection - DIALYSIS  60 mcg Intravenous Q Wed-HD  . docusate sodium  100 mg Oral BID  . heparin  3,000 Units Dialysis Once in dialysis  . heparin  5,000 Units Subcutaneous 3 times per day  . hydrALAZINE  50 mg Oral 3 times per day  . insulin aspart  0-15 Units Subcutaneous TID WC  . insulin detemir  20 Units Subcutaneous QHS  . isosorbide mononitrate  60 mg Oral Daily  . labetalol  300 mg Oral 3 times per day  . lamoTRIgine  100 mg Oral QHS  . minoxidil  10 mg Oral BID  . sevelamer carbonate  1,600 mg Oral TID AC & HS  . sodium chloride  3 mL Intravenous Q12H  . sodium chloride  3 mL Intravenous Q12H  . sodium chloride  3 mL Intravenous Q12H   Continuous Infusions:  Antibiotics Given (last 72 hours)    None      Principal Problem:   Chest pain Active Problems:   Essential hypertension, benign   End stage renal disease on dialysis   Coronary artery disease   Hyperlipidemia   CAD (coronary artery disease)   Bimalleolar ankle fracture    Time spent: 25 min    VANN, JESSICA  [facility] Pager [phone]. If 7PM-7AM, please contact night-coverage at [email or web address], password [facility] 02/15/2015, 12:42 PM  LOS: 6 days

## 2015-02-15 NOTE — Progress Notes (Signed)
UR Completed Brenda Graves-Bigelow, RN,BSN [phone]  

## 2015-02-16 ENCOUNTER — Encounter (HOSPITAL_COMMUNITY): Payer: Self-pay | Admitting: Anesthesiology

## 2015-02-16 ENCOUNTER — Inpatient Hospital Stay (HOSPITAL_COMMUNITY): Payer: Medicare Other

## 2015-02-16 ENCOUNTER — Encounter (HOSPITAL_COMMUNITY)
Admission: RE | Disposition: A | Discharge status: Home / Self Care | Payer: Self-pay | Source: Ambulatory Visit | Attending: Cardiovascular Disease

## 2015-02-16 ENCOUNTER — Inpatient Hospital Stay (HOSPITAL_COMMUNITY): Payer: Medicare Other | Admitting: Anesthesiology

## 2015-02-16 DIAGNOSIS — N186 End stage renal disease: Secondary | ICD-10-CM

## 2015-02-16 DIAGNOSIS — E1365 Other specified diabetes mellitus with hyperglycemia: Secondary | ICD-10-CM

## 2015-02-16 DIAGNOSIS — R52 Pain, unspecified: Secondary | ICD-10-CM

## 2015-02-16 DIAGNOSIS — E1322 Other specified diabetes mellitus with diabetic chronic kidney disease: Secondary | ICD-10-CM | POA: Diagnosis present

## 2015-02-16 DIAGNOSIS — IMO0002 Reserved for concepts with insufficient information to code with codable children: Secondary | ICD-10-CM | POA: Diagnosis present

## 2015-02-16 HISTORY — PX: ORIF ANKLE FRACTURE: SHX5408

## 2015-02-16 LAB — CBC WITH DIFFERENTIAL/PLATELET
BASOS ABS: 0 10*3/uL (ref 0.0–0.1)
Basophils Relative: 0 %
EOS ABS: 0.1 10*3/uL (ref 0.0–0.7)
EOS PCT: 2 %
HCT: 27.5 % — ABNORMAL LOW (ref 36.0–46.0)
Hemoglobin: 8.7 g/dL — ABNORMAL LOW (ref 12.0–15.0)
LYMPHS ABS: 1.3 10*3/uL (ref 0.7–4.0)
Lymphocytes Relative: 19 %
MCH: 31.1 pg (ref 26.0–34.0)
MCHC: 31.6 g/dL (ref 30.0–36.0)
MCV: 98.2 fL (ref 78.0–100.0)
MONO ABS: 0.9 10*3/uL (ref 0.1–1.0)
Monocytes Relative: 14 %
Neutro Abs: 4.5 10*3/uL (ref 1.7–7.7)
Neutrophils Relative %: 65 %
PLATELETS: 153 10*3/uL (ref 150–400)
RBC: 2.8 MIL/uL — AB (ref 3.87–5.11)
RDW: 15.5 % (ref 11.5–15.5)
WBC: 6.9 10*3/uL (ref 4.0–10.5)

## 2015-02-16 LAB — GLUCOSE, CAPILLARY
GLUCOSE-CAPILLARY: 112 mg/dL — AB (ref 65–99)
GLUCOSE-CAPILLARY: 170 mg/dL — AB (ref 65–99)
GLUCOSE-CAPILLARY: 263 mg/dL — AB (ref 65–99)
GLUCOSE-CAPILLARY: 70 mg/dL (ref 65–99)
Glucose-Capillary: 206 mg/dL — ABNORMAL HIGH (ref 65–99)

## 2015-02-16 LAB — TYPE AND SCREEN
ABO/RH(D): A POS
Antibody Screen: NEGATIVE

## 2015-02-16 LAB — POCT I-STAT 4, (NA,K, GLUC, HGB,HCT)
Glucose, Bld: 235 mg/dL — ABNORMAL HIGH (ref 65–99)
HCT: 28 % — ABNORMAL LOW (ref 36.0–46.0)
Hemoglobin: 9.5 g/dL — ABNORMAL LOW (ref 12.0–15.0)
Potassium: 5.6 mmol/L — ABNORMAL HIGH (ref 3.5–5.1)
Sodium: 134 mmol/L — ABNORMAL LOW (ref 135–145)

## 2015-02-16 SURGERY — OPEN REDUCTION INTERNAL FIXATION (ORIF) ANKLE FRACTURE
Anesthesia: Monitor Anesthesia Care | Site: Ankle | Laterality: Right

## 2015-02-16 MED ORDER — PROPOFOL 10 MG/ML IV BOLUS
INTRAVENOUS | Status: DC | PRN
  Administered 2015-02-16: 200 mg via INTRAVENOUS

## 2015-02-16 MED ORDER — ARTIFICIAL TEARS OP OINT
TOPICAL_OINTMENT | OPHTHALMIC | Status: DC | PRN
  Administered 2015-02-16: 1 via OPHTHALMIC

## 2015-02-16 MED ORDER — HYDROMORPHONE HCL 1 MG/ML IJ SOLN
1.0000 mg | INTRAMUSCULAR | Status: DC | PRN
  Administered 2015-02-16 – 2015-02-18 (×8): 1 mg via INTRAVENOUS
  Filled 2015-02-16 (×8): qty 1

## 2015-02-16 MED ORDER — MIDAZOLAM HCL 2 MG/2ML IJ SOLN
INTRAMUSCULAR | Status: AC
Start: 2015-02-16 — End: 2015-02-16
  Filled 2015-02-16: qty 4

## 2015-02-16 MED ORDER — PROPOFOL INFUSION 10 MG/ML OPTIME
INTRAVENOUS | Status: DC | PRN
  Administered 2015-02-16: 25 ug/kg/min via INTRAVENOUS

## 2015-02-16 MED ORDER — 0.9 % SODIUM CHLORIDE (POUR BTL) OPTIME
TOPICAL | Status: DC | PRN
  Administered 2015-02-16: 1000 mL

## 2015-02-16 MED ORDER — FENTANYL CITRATE (PF) 100 MCG/2ML IJ SOLN
INTRAMUSCULAR | Status: DC | PRN
  Administered 2015-02-16 (×4): 25 ug via INTRAVENOUS

## 2015-02-16 MED ORDER — HYDROMORPHONE HCL 1 MG/ML IJ SOLN: 0.2500 mg | INTRAMUSCULAR | Status: DC | PRN

## 2015-02-16 MED ORDER — INSULIN ASPART 100 UNIT/ML IV SOLN
5.0000 [IU] | Freq: Once | INTRAVENOUS | Status: DC
  Filled 2015-02-16: qty 0.05

## 2015-02-16 MED ORDER — PROPOFOL 10 MG/ML IV BOLUS
INTRAVENOUS | Status: AC
  Filled 2015-02-16: qty 20

## 2015-02-16 MED ORDER — METHOCARBAMOL 500 MG PO TABS
500.0000 mg | ORAL_TABLET | Freq: Four times a day (QID) | ORAL | Status: DC | PRN
  Administered 2015-02-16 – 2015-02-18 (×3): 500 mg via ORAL
  Filled 2015-02-16 (×5): qty 1

## 2015-02-16 MED ORDER — FENTANYL CITRATE (PF) 250 MCG/5ML IJ SOLN
INTRAMUSCULAR | Status: AC
  Filled 2015-02-16: qty 5

## 2015-02-16 MED ORDER — ARTIFICIAL TEARS OP OINT
TOPICAL_OINTMENT | OPHTHALMIC | Status: AC
  Filled 2015-02-16: qty 3.5

## 2015-02-16 MED ORDER — ONDANSETRON HCL 4 MG/2ML IJ SOLN
INTRAMUSCULAR | Status: DC | PRN
Start: 2015-02-16 — End: 2015-02-16
  Administered 2015-02-16: 4 mg via INTRAVENOUS

## 2015-02-16 MED ORDER — ONDANSETRON HCL 4 MG/2ML IJ SOLN
INTRAMUSCULAR | Status: AC
  Filled 2015-02-16: qty 2

## 2015-02-16 MED ORDER — INSULIN ASPART 100 UNIT/ML ~~LOC~~ SOLN
5.0000 [IU] | Freq: Once | SUBCUTANEOUS | Status: DC
  Filled 2015-02-16: qty 0.05

## 2015-02-16 MED ORDER — PROPOFOL 10 MG/ML IV BOLUS
INTRAVENOUS | Status: AC
Start: 2015-02-16 — End: 2015-02-16
  Filled 2015-02-16: qty 20

## 2015-02-16 MED ORDER — PROMETHAZINE HCL 25 MG/ML IJ SOLN: 6.2500 mg | INTRAMUSCULAR | Status: DC | PRN

## 2015-02-16 MED ORDER — BUPIVACAINE-EPINEPHRINE (PF) 0.5% -1:200000 IJ SOLN
INTRAMUSCULAR | Status: DC | PRN
  Administered 2015-02-16: 50 mL via PERINEURAL

## 2015-02-16 MED ORDER — MIDAZOLAM HCL 5 MG/5ML IJ SOLN
INTRAMUSCULAR | Status: DC | PRN
  Administered 2015-02-16 (×4): 0.5 mg via INTRAVENOUS

## 2015-02-16 MED ORDER — MEPERIDINE HCL 25 MG/ML IJ SOLN: 6.2500 mg | INTRAMUSCULAR | Status: DC | PRN

## 2015-02-16 MED ORDER — LIDOCAINE HCL (PF) 2 % IJ SOLN
INTRAMUSCULAR | Status: DC | PRN
  Administered 2015-02-16: 10 mL via PERINEURAL

## 2015-02-16 MED ORDER — INSULIN ASPART 100 UNIT/ML IV SOLN
INTRAVENOUS | Status: DC | PRN
  Administered 2015-02-16: 5 [IU] via INTRAVENOUS

## 2015-02-16 SURGICAL SUPPLY — 88 items
BANDAGE ELASTIC 4 VELCRO ST LF (GAUZE/BANDAGES/DRESSINGS) ×2 IMPLANT
BANDAGE ELASTIC 6 VELCRO ST LF (GAUZE/BANDAGES/DRESSINGS) ×2 IMPLANT
BANDAGE ESMARK 6X9 LF (GAUZE/BANDAGES/DRESSINGS) ×1 IMPLANT
BIT DRILL 2 QR W/DEPTH MARKS (BIT) ×3
BIT DRILL 2.8 QR W/DEPTH MARKS (BIT) ×2 IMPLANT
BIT DRILL 2MM QR W/DEPTH MARKS (BIT) IMPLANT
BLADE SURG 10 STRL SS (BLADE) ×3 IMPLANT
BNDG CMPR 9X6 STRL LF SNTH (GAUZE/BANDAGES/DRESSINGS) ×1
BNDG COHESIVE 4X5 TAN STRL (GAUZE/BANDAGES/DRESSINGS) ×3 IMPLANT
BNDG ESMARK 6X9 LF (GAUZE/BANDAGES/DRESSINGS) ×3
BNDG GAUZE ELAST 4 BULKY (GAUZE/BANDAGES/DRESSINGS) ×4 IMPLANT
BRUSH SCRUB DISP (MISCELLANEOUS) ×6 IMPLANT
COTTON STERILE ROLL (GAUZE/BANDAGES/DRESSINGS) ×2 IMPLANT
COVER SURGICAL LIGHT HANDLE (MISCELLANEOUS) ×6 IMPLANT
CUFF TOURNIQUET SINGLE 34IN LL (TOURNIQUET CUFF) ×2 IMPLANT
DRAPE C-ARM 42X72 X-RAY (DRAPES) ×4 IMPLANT
DRAPE C-ARMOR (DRAPES) ×2 IMPLANT
DRAPE ORTHO SPLIT 77X108 STRL (DRAPES) ×9
DRAPE PROXIMA HALF (DRAPES) ×3 IMPLANT
DRAPE SURG ORHT 6 SPLT 77X108 (DRAPES) ×3 IMPLANT
DRAPE U-SHAPE 47X51 STRL (DRAPES) ×3 IMPLANT
DRSG ADAPTIC 3X8 NADH LF (GAUZE/BANDAGES/DRESSINGS) ×2 IMPLANT
DRSG EMULSION OIL 3X3 NADH (GAUZE/BANDAGES/DRESSINGS) IMPLANT
ELECT REM PT RETURN 9FT ADLT (ELECTROSURGICAL) ×3
ELECTRODE REM PT RTRN 9FT ADLT (ELECTROSURGICAL) ×1 IMPLANT
GAUZE SPONGE 4X4 12PLY STRL (GAUZE/BANDAGES/DRESSINGS) IMPLANT
GLOVE BIO SURGEON STRL SZ7.5 (GLOVE) ×3 IMPLANT
GLOVE BIO SURGEON STRL SZ8 (GLOVE) ×3 IMPLANT
GLOVE BIOGEL PI IND STRL 7.5 (GLOVE) ×1 IMPLANT
GLOVE BIOGEL PI IND STRL 8 (GLOVE) ×1 IMPLANT
GLOVE BIOGEL PI INDICATOR 7.5 (GLOVE) ×2
GLOVE BIOGEL PI INDICATOR 8 (GLOVE) ×2
GLOVE PROGUARD SZ 7 1/2 (GLOVE) ×2 IMPLANT
GOWN STRL REUS W/ TWL LRG LVL3 (GOWN DISPOSABLE) ×2 IMPLANT
GOWN STRL REUS W/ TWL XL LVL3 (GOWN DISPOSABLE) ×1 IMPLANT
GOWN STRL REUS W/TWL LRG LVL3 (GOWN DISPOSABLE) ×6
GOWN STRL REUS W/TWL XL LVL3 (GOWN DISPOSABLE) ×3
GUIDEWIRE ORTH 6X062XTROC NS (WIRE) IMPLANT
K-WIRE .062 (WIRE) ×3
KIT BASIN OR (CUSTOM PROCEDURE TRAY) ×3 IMPLANT
KIT ROOM TURNOVER OR (KITS) ×3 IMPLANT
MANIFOLD NEPTUNE II (INSTRUMENTS) ×3 IMPLANT
NDL HYPO 21X1.5 SAFETY (NEEDLE) IMPLANT
NEEDLE HYPO 21X1.5 SAFETY (NEEDLE) IMPLANT
NS IRRIG 1000ML POUR BTL (IV SOLUTION) ×3 IMPLANT
PACK GENERAL/GYN (CUSTOM PROCEDURE TRAY) ×3 IMPLANT
PAD ARMBOARD 7.5X6 YLW CONV (MISCELLANEOUS) ×6 IMPLANT
PAD CAST 4YDX4 CTTN HI CHSV (CAST SUPPLIES) IMPLANT
PADDING CAST COTTON 4X4 STRL (CAST SUPPLIES)
PADDING CAST COTTON 6X4 STRL (CAST SUPPLIES) IMPLANT
PENCIL BUTTON HOLSTER BLD 10FT (ELECTRODE) ×3 IMPLANT
PLATE 3HOLE HOOK (Plate) ×2 IMPLANT
PLATE ANTI GLIDE MEDIAL (Plate) ×2 IMPLANT
SCREW LOCK 18X2.7X HEXALOBE (Screw) IMPLANT
SCREW LOCK 2.7X48MM (Screw) ×2 IMPLANT
SCREW LOCK 20X2.7X HEXALOBE (Screw) IMPLANT
SCREW LOCK 22X2.7X HEXALOBE (Screw) IMPLANT
SCREW LOCK 38X3.5X HEXALOBE (Screw) IMPLANT
SCREW LOCKING 2.7X18MM (Screw) ×3 IMPLANT
SCREW LOCKING 2.7X20MM (Screw) ×3 IMPLANT
SCREW LOCKING 2.7X22MM (Screw) ×3 IMPLANT
SCREW LOCKING 3.5X38 (Screw) ×3 IMPLANT
SCREW NON LOCK 3.5X40 (Screw) ×2 IMPLANT
SCREW NON LOCKING HEX 3.5X38 (Screw) ×2 IMPLANT
SCREW NON LOCKING HEX 3.5X45 (Screw) ×2 IMPLANT
SCREW NON LOCKING HEX 3.5X50MM (Screw) ×2 IMPLANT
SCREW NONLOCK 2.7X14 (Screw) ×2 IMPLANT
SCREW NONLOCK HEX 2.7X18MM (Screw) ×2 IMPLANT
SPLINT PLASTER CAST XFAST 5X30 (CAST SUPPLIES) IMPLANT
SPLINT PLASTER XFAST SET 5X30 (CAST SUPPLIES) ×2
SPONGE GAUZE 4X4 12PLY STER LF (GAUZE/BANDAGES/DRESSINGS) ×2 IMPLANT
SPONGE LAP 18X18 X RAY DECT (DISPOSABLE) ×9 IMPLANT
SPONGE SCRUB IODOPHOR (GAUZE/BANDAGES/DRESSINGS) ×3 IMPLANT
STAPLER VISISTAT 35W (STAPLE) IMPLANT
SUCTION FRAZIER TIP 10 FR DISP (SUCTIONS) ×3 IMPLANT
SUT ETHILON 2 0 FS 18 (SUTURE) ×3 IMPLANT
SUT ETHILON 3 0 PS 1 (SUTURE) ×4 IMPLANT
SUT PDS AB 2-0 CT1 27 (SUTURE) ×2 IMPLANT
SUT VIC AB 2-0 CT1 27 (SUTURE)
SUT VIC AB 2-0 CT1 TAPERPNT 27 (SUTURE) ×2 IMPLANT
SUT VIC AB 2-0 CT3 27 (SUTURE) IMPLANT
SYR CONTROL 10ML LL (SYRINGE) IMPLANT
TOWEL OR 17X24 6PK STRL BLUE (TOWEL DISPOSABLE) ×3 IMPLANT
TOWEL OR 17X26 10 PK STRL BLUE (TOWEL DISPOSABLE) ×6 IMPLANT
TUBE CONNECTING 12'X1/4 (SUCTIONS) ×1
TUBE CONNECTING 12X1/4 (SUCTIONS) ×2 IMPLANT
UNDERPAD 30X30 INCONTINENT (UNDERPADS AND DIAPERS) ×3 IMPLANT
WATER STERILE IRR 1000ML POUR (IV SOLUTION) ×1 IMPLANT

## 2015-02-16 NOTE — Progress Notes (Signed)
PROGRESS NOTE  [name]:[id number] DOB: January 06, 1976 DOA: 02/09/2015 PCP: Merrilee Seashore, MD  [name] is a 39 y.o. female, with past medical history of diabetes mellitus, coronary artery disease, peripheral neuropathy, end-stage renal disease on hemodialysis, peripheral vascular disease, consistent ground-level fall on 02/09/2015, with bimalleolar right ankle fracture, admitted by Dr. Oralia Rud for cardiac cath, as part of preop clearance, as patient was planned for cardiac cath 9/27 , admitted for cardiac cath on 9/16 significant for noncritical coronary artery disease with normal LV systolic function, the plan by orthopedic for surgical repair currently on hold secondary to significant ankle edema, patient is been followed by nephrology service for hemodialysis, seen by orthopedic with plan for surgery this coming Thursday, cardiology consulted hospitalist service to resume care and given no cardiac issues.   Assessment/Plan: Fall with right bimalleolar fracture Had surgery today. Significant pain. We'll resume IV Dilaudid as needed in addition to oral opiate analgesics.  History of coronary artery disease - By cardiology, recent cardiac cath with noncritical coronary artery disease - denies any chest pain or shortness of breath - she is on aspirin and plavix, Imdur statin and beta blockers  End-stage renal disease - seen by nephrology, on hemodialysis.  Hypertension - Blood pressure acceptable on Norvasc, clonidine, hydralazine, minoxidil, Imdur and labetalol  Diabetes mellitus - Continue with levemir and insulin sliding scale  Hyperlipidemia - Continue with statin  Code Status: full Family Communication: daughters Disposition Plan: Per orthopedics   Consultants:  Ortho  cardiology  Procedures:  cath    HPI/Subjective: Complaining of uncontrolled pain.  Objective: Filed Vitals:   02/16/15 1135  BP: 136/59  Pulse: 74  Temp: 98.4 F (36.9 C)   Resp: 14    Intake/Output Summary (Last 24 hours) at 02/16/15 1519 Last data filed at 02/16/15 1042  Gross per 24 hour  Intake    450 ml  Output    100 ml  Net    350 ml   Filed Weights   02/15/15 0425 02/15/15 0658 02/16/15 0426  Weight: 112.7 kg (248 lb 7.3 oz) 107.8 kg (237 lb 10.5 oz) 110.2 kg (242 lb 15.2 oz)    Exam:   General:  Awake, uncomfortable appearing  Cardiovascular: rrr  Respiratory: clear  Abdomen: +BS, soft  Musculoskeletal: leg elevated and wrapped   Data Reviewed: Basic Metabolic Panel:  Recent Labs Lab 02/10/15 1417 02/11/15 0741 02/13/15 1427 02/14/15 1523 02/15/15 0738 02/15/15 1313 02/16/15 1009  NA 135 134* 133*  --  132* 138 134*  K 5.2* 4.4 5.9* 5.1 5.4* 3.9 5.6*  CL 98* 95* 95*  --  95* 96*  --   CO2 25 27 24   --  23 30  --   GLUCOSE 188* 161* 120*  --  200* 54* 235*  BUN 49* 40* 81*  --  62* 28*  --   CREATININE 7.94* 7.35* 12.31*  --  11.04* 6.44*  --   CALCIUM 8.7* 9.0 8.7*  --  9.1 8.6*  --   PHOS 5.8*  --  9.8*  --  9.3* 4.9*  --    Liver Function Tests:  Recent Labs Lab 02/09/15 2337 02/10/15 1417 02/13/15 1427 02/15/15 0738 02/15/15 1313  AST 13*  --   --   --   --   ALT 11*  --   --   --   --   ALKPHOS 47  --   --   --   --   BILITOT 0.6  --   --   --   --  PROT 6.3*  --   --   --   --   ALBUMIN 3.6 3.3* 3.0* 3.0* 3.0*   No results for input(s): LIPASE, AMYLASE in the last 168 hours. No results for input(s): AMMONIA in the last 168 hours. CBC:  Recent Labs Lab 02/09/15 2337 02/11/15 0741 02/13/15 1427 02/15/15 1313 02/16/15 0431 02/16/15 1009  WBC 9.6 6.1 7.0 4.0 6.9  --   NEUTROABS 6.9  --   --   --  4.5  --   HGB 10.4* 10.5* 8.7* 9.0* 8.7* 9.5*  HCT 32.2* 33.5* 26.6* 28.2* 27.5* 28.0*  MCV 97.3 96.8 94.0 94.6 98.2  --   PLT 141* 123* 121* 137* 153  --    Cardiac Enzymes: No results for input(s): CKTOTAL, CKMB, CKMBINDEX, TROPONINI in the last 168 hours. BNP (last 3 results)  Recent Labs   10/30/14 1932  BNP 2024.0*    ProBNP (last 3 results) No results for input(s): PROBNP in the last 8760 hours.  CBG:  Recent Labs Lab 02/15/15 1136 02/15/15 1535 02/15/15 2020 02/16/15 0029 02/16/15 1040  GLUCAP 108* 80 227* 112* 206*    No results found for this or any previous visit (from the past 240 hour(s)).   Studies: Dg Ankle Complete Right  02/16/2015   CLINICAL DATA:  Right ankle fracture  EXAM: DG C-ARM 61-120 MIN; RIGHT ANKLE - COMPLETE 3+ VIEW  COMPARISON:  02/09/2015  FLUOROSCOPY TIME:  Radiation Exposure Index (as provided by the fluoroscopic device): Not available  If the device does not provide the exposure index:  Fluoroscopy Time:  33 seconds  Number of Acquired Images:  3  FINDINGS: Fixation sideplate is noted along the distal tibia and distal fibula with multiple fixation screws. The fracture fragments are in near anatomic alignment.  IMPRESSION: Status post ORIF of right ankle fracture   Electronically Signed   By: Inez Catalina M.D.   On: 02/16/2015 11:03   Dg Ankle Right Port  02/16/2015   CLINICAL DATA:  Post open reduction internal fixation right ankle  EXAM: PORTABLE RIGHT ANKLE - 2 VIEW  COMPARISON:  02/16/2015  FINDINGS: Two views of the right ankle submitted. Again noted metallic fixation plate and screws in distal tibia and fibula. There is anatomic alignment. Ankle mortise is preserved.  IMPRESSION: Metallic fixation plate and screws in distal tibia and fibula. There is anatomic alignment.   Electronically Signed   By: Lahoma Crocker M.D.   On: 02/16/2015 11:37   Dg C-arm 1-60 Min  02/16/2015   CLINICAL DATA:  Right ankle fracture  EXAM: DG C-ARM 61-120 MIN; RIGHT ANKLE - COMPLETE 3+ VIEW  COMPARISON:  02/09/2015  FLUOROSCOPY TIME:  Radiation Exposure Index (as provided by the fluoroscopic device): Not available  If the device does not provide the exposure index:  Fluoroscopy Time:  33 seconds  Number of Acquired Images:  3  FINDINGS: Fixation sideplate is noted  along the distal tibia and distal fibula with multiple fixation screws. The fracture fragments are in near anatomic alignment.  IMPRESSION: Status post ORIF of right ankle fracture   Electronically Signed   By: Inez Catalina M.D.   On: 02/16/2015 11:03    Scheduled Meds: . amLODipine  10 mg Oral Daily  . aspirin EC  81 mg Oral Daily  . atorvastatin  40 mg Oral QPM  . cinacalcet  30 mg Oral Q breakfast  . cloNIDine  0.3 mg Oral 3 times per day  . clopidogrel  75  mg Oral Q breakfast  . darbepoetin (ARANESP) injection - DIALYSIS  60 mcg Intravenous Q Wed-HD  . docusate sodium  100 mg Oral BID  . heparin  3,000 Units Dialysis Once in dialysis  . heparin  5,000 Units Subcutaneous 3 times per day  . hydrALAZINE  50 mg Oral 3 times per day  . insulin aspart  0-15 Units Subcutaneous TID WC  . insulin detemir  20 Units Subcutaneous QHS  . isosorbide mononitrate  60 mg Oral Daily  . labetalol  300 mg Oral 3 times per day  . lamoTRIgine  100 mg Oral QHS  . minoxidil  10 mg Oral BID  . sevelamer carbonate  1,600 mg Oral TID AC & HS  . sodium chloride  3 mL Intravenous Q12H  . sodium chloride  3 mL Intravenous Q12H  . sodium chloride  3 mL Intravenous Q12H   Continuous Infusions:  Antibiotics Given (last 72 hours)    Date/Time Action Medication Dose   02/16/15 0920 Given   clindamycin (CLEOCIN) IVPB 900 mg 900 mg     Time spent: 20 min  [facility] [email or web address], password [facility] 02/16/2015, 3:19 PM  LOS: 7 days

## 2015-02-16 NOTE — Transfer of Care (Signed)
Immediate Anesthesia Transfer of Care Note  Patient: [name]  Procedure(s) Performed: Procedure(s): OPEN REDUCTION INTERNAL FIXATION (ORIF) RIGHT ANKLE FRACTURE (Right)  Patient Location: PACU  Anesthesia Type:General  Level of Consciousness: awake, sedated and patient cooperative  Airway & Oxygen Therapy: Patient Spontanous Breathing and Patient connected to nasal cannula oxygen  Post-op Assessment: Report given to RN and Post -op Vital signs reviewed and stable  Post vital signs: Reviewed and stable  Last Vitals:  Filed Vitals:   02/16/15 0550  BP: 160/67  Pulse: 79  Temp:   Resp:     Complications: No apparent anesthesia complications

## 2015-02-16 NOTE — Progress Notes (Signed)
OT Cancellation Note  Patient Details Name: [name] MRN: [id number] DOB: 1975/11/26   Cancelled Treatment:    Reason Eval/Treat Not Completed: Patient at procedure or test/ unavailable (surgery). Will check back for OT evaluation as time allows and appropriate.   CLAY,PATRICIA , MS, OTR/L, CLT Pager: [phone]  02/16/2015, 11:41 AM

## 2015-02-16 NOTE — Brief Op Note (Signed)
02/09/2015 - 02/16/2015  10:30 AM  PATIENT:  [name]  39 y.o. female  PRE-OPERATIVE DIAGNOSIS:  right ankle trimalleolar fracture subluxation  POST-OPERATIVE DIAGNOSIS:  right ankle trimalleolar fracture subluxation  PROCEDURE:  Procedure(s): 1. OPEN REDUCTION INTERNAL FIXATION (ORIF) RIGHT ANKLE TRIMALLEOLAR FRACTURE (Right) 2. STRESS FLOURO OF SYNDESMOSIS  SURGEON:  Surgeon(s) and Role:    * Altamese [place], MD - Primary  PHYSICIAN ASSISTANT: Ainsley Spinner, PA-C  ANESTHESIA:   general with regional block  I/O:     SPECIMEN:  No Specimen  TOURNIQUET:  None  DICTATION: .[phone]

## 2015-02-16 NOTE — Progress Notes (Signed)
No new concerns since yesterday. No change in examination.  Right tibial plafond and unstable ankle fracture for ORIF today.  I discussed with the patient the risks and benefits of surgery, including the possibility of infection, stroke, MI, and death, in addition to nerve injury, vessel injury, wound breakdown, arthritis, symptomatic hardware, DVT/ PE, exacerbation of neuropathic pain, loss of motion, and need for further surgery among others. We also specifically discussed the elevated risks of these given her dialysis and poorly controlled diabetes.She understood these risks and wished to proceed.  Altamese Wythe, MD [facility] [phone] (p)

## 2015-02-16 NOTE — Anesthesia Postprocedure Evaluation (Signed)
Anesthesia Post Note  Patient: [name]  Procedure(s) Performed: Procedure(s) (LRB): OPEN REDUCTION INTERNAL FIXATION (ORIF) RIGHT ANKLE FRACTURE (Right)  Anesthesia type: General + ac/pop blocks  Patient location: PACU  Post pain: Pain level controlled  Post assessment: Post-op Vital signs reviewed  Last Vitals: BP 136/59 mmHg  Pulse 74  Temp(Src) 36.9 C (Oral)  Resp 14  Ht 6\' 3"  (1.905 m)  Wt 242 lb 15.2 oz (110.2 kg)  BMI 30.37 kg/m2  SpO2 98%  LMP 01/09/2015  Post vital signs: Reviewed  Level of consciousness: sedated  Complications: No apparent anesthesia complications

## 2015-02-16 NOTE — Progress Notes (Signed)
[facility] Progress Note  Assessment/Plan: 1. Bimalleolar fracture of right ankle - Had ORIF per Dr. Marcelino Scot today. Awake, doing well.  2. Pre-op cardiac eval - heart cath showed non-obstructive CAD 3. ESRD - MWF at [facility]. For HD tomorrow.  4. HTN/volume -HD yesterday Net UF 1036. No post wt. BP Controlled, 5 BP meds including minoxidil 5. Anemia - HGB 9.5. Monitor HGB .  6. Metabolic bone disease - Ca 8.6, C Ca 8.92 Continue Sensipar and binders. 7. DM: Per primary  Rita H. Brown NP-C 02/16/2015, 2:33 PM  [facility] [phone]  Pt seen, examined and agree w A/P as above.  Kelly Splinter MD pager [phone]    cell [phone] 02/16/2015, 3:02 PM    Subjective:  "I'm hurting". Concerned about whether or not she will get to eat. Denies Chest pain/SOB. In supine position without WOB.    Objective Filed Vitals:   02/16/15 1045 02/16/15 1100 02/16/15 1115 02/16/15 1135  BP: 125/63 143/48 142/63 136/59  Pulse: 78 77 77 74  Temp:    98.4 F (36.9 C)  TempSrc:      Resp: 14 16 14 14   Height:      Weight:      SpO2: 94% 94% 98% 98%   Physical Exam General: well nourished, NAD Heart: RRR with S1 S2. II/VI systolic M. No R/G Lungs: Bilateral breath sounds CTA A/P. Without WOB. Abdomen: abdomen soft, non-tender active BS. Extremities: No LE edema LLE, Cast on RLE. Pulses intact. Dialysis Access: LFA AVF +thrll + bruit.  Dialysis Orders: Center: [facility] on MWF . EDW 105 kg HD Bath 2.0K 2.0 Ca Time 4 hours Heparin 3000 units per tx. Access LFA AVF BFR 450 DFR Auto 1.5  Venofer 50 mg IV weekly (last dose 02/03/15)  Mircera 75 mcg IV q 2 weeks (last dose 02/01/15)   Additional Objective Labs: Basic Metabolic Panel:  Recent Labs Lab 02/13/15 1427  02/15/15 0738 02/15/15 1313 02/16/15 1009  NA 133*  --  132* 138 134*  K 5.9*  < > 5.4* 3.9 5.6*  CL 95*  --  95* 96*  --   CO2 24  --  23 30  --   GLUCOSE 120*  --  200* 54* 235*  BUN 81*   --  62* 28*  --   CREATININE 12.31*  --  11.04* 6.44*  --   CALCIUM 8.7*  --  9.1 8.6*  --   PHOS 9.8*  --  9.3* 4.9*  --   < > = values in this interval not displayed. Liver Function Tests:  Recent Labs Lab 02/09/15 2337  02/13/15 1427 02/15/15 0738 02/15/15 1313  AST 13*  --   --   --   --   ALT 11*  --   --   --   --   ALKPHOS 47  --   --   --   --   BILITOT 0.6  --   --   --   --   PROT 6.3*  --   --   --   --   ALBUMIN 3.6  < > 3.0* 3.0* 3.0*  < > = values in this interval not displayed. No results for input(s): LIPASE, AMYLASE in the last 168 hours. CBC:  Recent Labs Lab 02/09/15 2337 02/11/15 0741 02/13/15 1427 02/15/15 1313 02/16/15 0431 02/16/15 1009  WBC 9.6 6.1 7.0 4.0 6.9  --   NEUTROABS 6.9  --   --   --  4.5  --   HGB 10.4* 10.5* 8.7* 9.0* 8.7* 9.5*  HCT 32.2* 33.5* 26.6* 28.2* 27.5* 28.0*  MCV 97.3 96.8 94.0 94.6 98.2  --   PLT 141* 123* 121* 137* 153  --    Blood Culture    Component Value Date/Time   SDES BLOOD ARM RIGHT 10/16/2011 1037   SPECREQUEST BOTTLES DRAWN AEROBIC AND ANAEROBIC 10CC 10/16/2011 1037   CULT NO GROWTH 5 DAYS 10/16/2011 1037   REPTSTATUS 10/22/2011 FINAL 10/16/2011 1037    Cardiac Enzymes: No results for input(s): CKTOTAL, CKMB, CKMBINDEX, TROPONINI in the last 168 hours. CBG:  Recent Labs Lab 02/15/15 1136 02/15/15 1535 02/15/15 2020 02/16/15 0029 02/16/15 1040  GLUCAP 108* 80 227* 112* 206*   Iron Studies: No results for input(s): IRON, TIBC, TRANSFERRIN, FERRITIN in the last 72 hours. @lablastinr3 @ Studies/Results: Dg Ankle Complete Right  02/16/2015   CLINICAL DATA:  Right ankle fracture  EXAM: DG C-ARM 61-120 MIN; RIGHT ANKLE - COMPLETE 3+ VIEW  COMPARISON:  02/09/2015  FLUOROSCOPY TIME:  Radiation Exposure Index (as provided by the fluoroscopic device): Not available  If the device does not provide the exposure index:  Fluoroscopy Time:  33 seconds  Number of Acquired Images:  3  FINDINGS: Fixation sideplate  is noted along the distal tibia and distal fibula with multiple fixation screws. The fracture fragments are in near anatomic alignment.  IMPRESSION: Status post ORIF of right ankle fracture   Electronically Signed   By: Inez Catalina M.D.   On: 02/16/2015 11:03   Dg Ankle Right Port  02/16/2015   CLINICAL DATA:  Post open reduction internal fixation right ankle  EXAM: PORTABLE RIGHT ANKLE - 2 VIEW  COMPARISON:  02/16/2015  FINDINGS: Two views of the right ankle submitted. Again noted metallic fixation plate and screws in distal tibia and fibula. There is anatomic alignment. Ankle mortise is preserved.  IMPRESSION: Metallic fixation plate and screws in distal tibia and fibula. There is anatomic alignment.   Electronically Signed   By: Lahoma Crocker M.D.   On: 02/16/2015 11:37   Dg C-arm 1-60 Min  02/16/2015   CLINICAL DATA:  Right ankle fracture  EXAM: DG C-ARM 61-120 MIN; RIGHT ANKLE - COMPLETE 3+ VIEW  COMPARISON:  02/09/2015  FLUOROSCOPY TIME:  Radiation Exposure Index (as provided by the fluoroscopic device): Not available  If the device does not provide the exposure index:  Fluoroscopy Time:  33 seconds  Number of Acquired Images:  3  FINDINGS: Fixation sideplate is noted along the distal tibia and distal fibula with multiple fixation screws. The fracture fragments are in near anatomic alignment.  IMPRESSION: Status post ORIF of right ankle fracture   Electronically Signed   By: Inez Catalina M.D.   On: 02/16/2015 11:03   Medications:   . amLODipine  10 mg Oral Daily  . aspirin EC  81 mg Oral Daily  . atorvastatin  40 mg Oral QPM  . cinacalcet  30 mg Oral Q breakfast  . cloNIDine  0.3 mg Oral 3 times per day  . clopidogrel  75 mg Oral Q breakfast  . darbepoetin (ARANESP) injection - DIALYSIS  60 mcg Intravenous Q Wed-HD  . docusate sodium  100 mg Oral BID  . heparin  3,000 Units Dialysis Once in dialysis  . heparin  5,000 Units Subcutaneous 3 times per day  . hydrALAZINE  50 mg Oral 3 times per  day  . insulin aspart  0-15 Units Subcutaneous TID WC  .  insulin detemir  20 Units Subcutaneous QHS  . isosorbide mononitrate  60 mg Oral Daily  . labetalol  300 mg Oral 3 times per day  . lamoTRIgine  100 mg Oral QHS  . minoxidil  10 mg Oral BID  . sevelamer carbonate  1,600 mg Oral TID AC & HS  . sodium chloride  3 mL Intravenous Q12H  . sodium chloride  3 mL Intravenous Q12H  . sodium chloride  3 mL Intravenous Q12H

## 2015-02-16 NOTE — Anesthesia Procedure Notes (Addendum)
Anesthesia Regional Block:  Adductor canal block  Pre-Anesthetic Checklist: ,, timeout performed, Correct Patient, Correct Site, Correct Laterality, Correct Procedure, Correct Position, site marked, Risks and benefits discussed, Surgical consent,  Pre-op evaluation,  Post-op pain management  Laterality: Right  Prep: chloraprep       Needles:  Injection technique: Single-shot  Needle Type: Stimiplex     Needle Length: 9cm 9 cm Needle Gauge: 21 and 21 G    Additional Needles:  Procedures: ultrasound guided (picture in chart) Adductor canal block Narrative:  Injection made incrementally with aspirations every 5 mL.  Performed by: Personally  Anesthesiologist: Nolon Nations  Additional Notes: BP cuff, EKG monitors applied. Sedation begun. Artery and nerve location verified with U/S and anesthetic injected incrementally, slowly, and after negative aspirations under direct u/s guidance. Good fascial /perineural spread. Tolerated well.   Anesthesia Regional Block:  Popliteal block  Pre-Anesthetic Checklist: ,, timeout performed, Correct Patient, Correct Site, Correct Laterality, Correct Procedure, Correct Position, site marked, Risks and benefits discussed, Surgical consent,  Pre-op evaluation,  Post-op pain management  Laterality: Right  Prep: chloraprep       Needles:  Injection technique: Single-shot  Needle Type: Stimiplex     Needle Length: 10cm 10 cm Needle Gauge: 21 and 21 G    Additional Needles:  Procedures: ultrasound guided (picture in chart)  Motor weakness within 5 minutes. Popliteal block Narrative:  Injection made incrementally with aspirations every 5 mL.  Performed by: Personally  Anesthesiologist: Nolon Nations  Additional Notes: Nerve located and needle positioned with direct ultrasound guidance. Good perineural spread. Patient tolerated well.   Procedure Name: LMA Insertion Date/Time: 02/16/2015 8:52 AM Performed by: Williemae Area  B Pre-anesthesia Checklist: Patient identified, Emergency Drugs available, Suction available and Patient being monitored Patient Re-evaluated:Patient Re-evaluated prior to inductionOxygen Delivery Method: Circle system utilized Preoxygenation: Pre-oxygenation with 100% oxygen Intubation Type: IV induction Ventilation: Mask ventilation without difficulty LMA: LMA inserted LMA Size: 4.0 Number of attempts: 1 Placement Confirmation: positive ETCO2 and breath sounds checked- equal and bilateral Tube secured with: Tape (taped across cheeks) Dental Injury: Teeth and Oropharynx as per pre-operative assessment

## 2015-02-17 DIAGNOSIS — S82841A Displaced bimalleolar fracture of right lower leg, initial encounter for closed fracture: Secondary | ICD-10-CM

## 2015-02-17 LAB — RENAL FUNCTION PANEL
Albumin: 2.8 g/dL — ABNORMAL LOW (ref 3.5–5.0)
Anion gap: 13 (ref 5–15)
BUN: 52 mg/dL — ABNORMAL HIGH (ref 6–20)
CO2: 23 mmol/L (ref 22–32)
Calcium: 9 mg/dL (ref 8.9–10.3)
Chloride: 95 mmol/L — ABNORMAL LOW (ref 101–111)
Creatinine, Ser: 10.34 mg/dL — ABNORMAL HIGH (ref 0.44–1.00)
GFR calc Af Amer: 5 mL/min — ABNORMAL LOW (ref >60)
GFR calc non Af Amer: 4 mL/min — ABNORMAL LOW (ref >60)
Glucose, Bld: 206 mg/dL — ABNORMAL HIGH (ref 65–99)
Phosphorus: 7.9 mg/dL — ABNORMAL HIGH (ref 2.5–4.6)
Potassium: 5.7 mmol/L — ABNORMAL HIGH (ref 3.5–5.1)
Sodium: 131 mmol/L — ABNORMAL LOW (ref 135–145)

## 2015-02-17 LAB — GLUCOSE, CAPILLARY
GLUCOSE-CAPILLARY: 164 mg/dL — AB (ref 65–99)
Glucose-Capillary: 190 mg/dL — ABNORMAL HIGH (ref 65–99)
Glucose-Capillary: 194 mg/dL — ABNORMAL HIGH (ref 65–99)
Glucose-Capillary: 163 mg/dL — ABNORMAL HIGH (ref 65–99)

## 2015-02-17 LAB — CBC
HCT: 24.3 % — ABNORMAL LOW (ref 36.0–46.0)
Hemoglobin: 7.8 g/dL — ABNORMAL LOW (ref 12.0–15.0)
MCH: 31.3 pg (ref 26.0–34.0)
MCHC: 32.1 g/dL (ref 30.0–36.0)
MCV: 97.6 fL (ref 78.0–100.0)
Platelets: 176 K/uL (ref 150–400)
RBC: 2.49 MIL/uL — ABNORMAL LOW (ref 3.87–5.11)
RDW: 15.9 % — ABNORMAL HIGH (ref 11.5–15.5)
WBC: 8.2 10*3/uL (ref 4.0–10.5)

## 2015-02-17 MED ORDER — SEVELAMER CARBONATE 800 MG PO TABS
4800.0000 mg | ORAL_TABLET | Freq: Three times a day (TID) | ORAL | Status: DC
  Administered 2015-02-18 (×2): 4800 mg via ORAL
  Filled 2015-02-17 (×3): qty 6

## 2015-02-17 MED ORDER — LAMOTRIGINE 100 MG PO TABS
50.0000 mg | ORAL_TABLET | Freq: Two times a day (BID) | ORAL | Status: DC
  Administered 2015-02-17: 50 mg via ORAL
  Filled 2015-02-17 (×2): qty 1

## 2015-02-17 MED ORDER — ALTEPLASE 2 MG IJ SOLR: 2.0000 mg | Freq: Once | INTRAMUSCULAR | Status: DC | PRN

## 2015-02-17 MED ORDER — HYDROMORPHONE HCL 1 MG/ML IJ SOLN
INTRAMUSCULAR | Status: AC
  Filled 2015-02-17: qty 1

## 2015-02-17 MED ORDER — SODIUM CHLORIDE 0.9 % IV SOLN: 100.0000 mL | INTRAVENOUS | Status: DC | PRN

## 2015-02-17 MED ORDER — PENTAFLUOROPROP-TETRAFLUOROETH EX AERO: 1.0000 "application " | INHALATION_SPRAY | CUTANEOUS | Status: DC | PRN

## 2015-02-17 MED ORDER — SODIUM CHLORIDE 0.9 % IV SOLN: 62.5000 mg | INTRAVENOUS | Status: DC

## 2015-02-17 MED ORDER — LIDOCAINE-PRILOCAINE 2.5-2.5 % EX CREA: 1.0000 "application " | TOPICAL_CREAM | CUTANEOUS | Status: DC | PRN

## 2015-02-17 MED ORDER — OXYCODONE HCL 5 MG PO TABS
ORAL_TABLET | ORAL | Status: AC
  Filled 2015-02-17: qty 2

## 2015-02-17 MED ORDER — SEVELAMER CARBONATE 800 MG PO TABS: 1600.0000 mg | ORAL_TABLET | Freq: Three times a day (TID) | ORAL | Status: DC

## 2015-02-17 MED ORDER — LIDOCAINE HCL (PF) 1 % IJ SOLN: 5.0000 mL | INTRAMUSCULAR | Status: DC | PRN

## 2015-02-17 MED ORDER — HEPARIN SODIUM (PORCINE) 1000 UNIT/ML DIALYSIS: 1000.0000 [IU] | INTRAMUSCULAR | Status: DC | PRN

## 2015-02-17 NOTE — Care Management Important Message (Signed)
Important Message  Patient Details  Name: [name] MRN: [id number] Date of Birth: [birth year]   Medicare Important Message Given:  Yes-third notification given    Loann Quill 02/17/2015, 11:26 AM

## 2015-02-17 NOTE — Progress Notes (Signed)
OT Cancellation Note  Patient Details Name: [name] MRN: [id number] DOB: 01-21-1976   Cancelled Treatment:    Reason Eval/Treat Not Completed: Patient declined, no reason specified (pt refused therapy ). Pt reports she is in pain and fatigued secondary to dialysis this morning. Will check back for OT eval as time allows and appropriate.  Binnie Kand M.S., OTR/L Pager: [phone]  02/17/2015, 2:36 PM

## 2015-02-17 NOTE — Progress Notes (Signed)
   02/17/15 1000  OT Visit Information  Last OT Received On 02/17/15  Reason Eval/Treat Not Completed Patient at procedure or test/ unavailable (pt at HD)  Will check back for OT evaluation as time allows and appropriate.  Truman Hayward, M.S., OTR/L Pager: [phone] 02/16/14 10:40 AM

## 2015-02-17 NOTE — Progress Notes (Addendum)
PROGRESS NOTE  [name] DXI:[id number] DOB: 1975/06/18 DOA: 02/09/2015 PCP: Merrilee Seashore, MD  [name] is a 39 y.o. female, with past medical history of diabetes mellitus, coronary artery disease, peripheral neuropathy, end-stage renal disease on hemodialysis, peripheral vascular disease, consistent ground-level fall on 02/09/2015, with bimalleolar right ankle fracture, admitted by Dr. Oralia Rud for cardiac cath, as part of preop clearance, as patient was planned for cardiac cath 9/27 , admitted for cardiac cath on 9/16 significant for noncritical coronary artery disease with normal LV systolic function, the plan by orthopedic for surgical repair currently on hold secondary to significant ankle edema, patient is been followed by nephrology service for hemodialysis, seen by orthopedic with plan for surgery this coming Thursday, cardiology consulted hospitalist service to resume care and given no cardiac issues.   Assessment/Plan: Fall with right bimalleolar fracture C/o  Significant pain, but after waking her up from sleep. He refuses to work with physical therapy today. Will need maximal encouragement to mobilize.  Anemia: monitor  History of coronary artery disease - By cardiology, recent cardiac cath with noncritical coronary artery disease - denies any chest pain or shortness of breath - she is on aspirin and plavix, Imdur statin and beta blockers  End-stage renal disease HD per usual schedule  Hypertension - Blood pressure acceptable on Norvasc, clonidine, hydralazine, minoxidil, Imdur and labetalol  Diabetes mellitus - Continue with levemir and insulin sliding scale  Hyperlipidemia - Continue with statin  Code Status: full Family Communication: daughters Disposition Plan: Per orthopedics   Consultants:  Ortho  cardiology  Procedures:  Cath  ORIF  HPI/Subjective: C/o pain  Objective: Filed Vitals:   02/17/15 1139  BP: 149/53  Pulse: 64  Temp:  98.9 F (37.2 C)  Resp: 18    Intake/Output Summary (Last 24 hours) at 02/17/15 1335 Last data filed at 02/17/15 1100  Gross per 24 hour  Intake      0 ml  Output   4000 ml  Net  -4000 ml   Filed Weights   02/16/15 0426 02/17/15 0500 02/17/15 1100  Weight: 110.2 kg (242 lb 15.2 oz) 113.7 kg (250 lb 10.6 oz) 109.2 kg (240 lb 11.9 oz)    Exam:   General:  Asleep. Arousable. Started crying once she woke up  Cardiovascular: rrr  Respiratory: clear  Abdomen: +BS, soft  Musculoskeletal: leg elevated and wrapped   Data Reviewed: Basic Metabolic Panel:  Recent Labs Lab 02/10/15 1417 02/11/15 0741 02/13/15 1427 02/14/15 1523 02/15/15 0738 02/15/15 1313 02/16/15 1009 02/17/15 0715  NA 135 134* 133*  --  132* 138 134* 131*  K 5.2* 4.4 5.9* 5.1 5.4* 3.9 5.6* 5.7*  CL 98* 95* 95*  --  95* 96*  --  95*  CO2 25 27 24   --  23 30  --  23  GLUCOSE 188* 161* 120*  --  200* 54* 235* 206*  BUN 49* 40* 81*  --  62* 28*  --  52*  CREATININE 7.94* 7.35* 12.31*  --  11.04* 6.44*  --  10.34*  CALCIUM 8.7* 9.0 8.7*  --  9.1 8.6*  --  9.0  PHOS 5.8*  --  9.8*  --  9.3* 4.9*  --  7.9*   Liver Function Tests:  Recent Labs Lab 02/10/15 1417 02/13/15 1427 02/15/15 0738 02/15/15 1313 02/17/15 0715  ALBUMIN 3.3* 3.0* 3.0* 3.0* 2.8*   No results for input(s): LIPASE, AMYLASE in the last 168 hours. No results for input(s): AMMONIA in the  last 168 hours. CBC:  Recent Labs Lab 02/11/15 0741 02/13/15 1427 02/15/15 1313 02/16/15 0431 02/16/15 1009 02/17/15 0715  WBC 6.1 7.0 4.0 6.9  --  8.2  NEUTROABS  --   --   --  4.5  --   --   HGB 10.5* 8.7* 9.0* 8.7* 9.5* 7.8*  HCT 33.5* 26.6* 28.2* 27.5* 28.0* 24.3*  MCV 96.8 94.0 94.6 98.2  --  97.6  PLT 123* 121* 137* 153  --  176   Cardiac Enzymes: No results for input(s): CKTOTAL, CKMB, CKMBINDEX, TROPONINI in the last 168 hours. BNP (last 3 results)  Recent Labs  10/30/14 1932  BNP 2024.0*    ProBNP (last 3 results) No  results for input(s): PROBNP in the last 8760 hours.  CBG:  Recent Labs Lab 02/16/15 1523 02/16/15 2013 02/16/15 2224 02/17/15 0637 02/17/15 1203  GLUCAP 263* 70 170* 190* 164*    No results found for this or any previous visit (from the past 240 hour(s)).   Studies: Dg Ankle Complete Right  02/16/2015   CLINICAL DATA:  Right ankle fracture  EXAM: DG C-ARM 61-120 MIN; RIGHT ANKLE - COMPLETE 3+ VIEW  COMPARISON:  02/09/2015  FLUOROSCOPY TIME:  Radiation Exposure Index (as provided by the fluoroscopic device): Not available  If the device does not provide the exposure index:  Fluoroscopy Time:  33 seconds  Number of Acquired Images:  3  FINDINGS: Fixation sideplate is noted along the distal tibia and distal fibula with multiple fixation screws. The fracture fragments are in near anatomic alignment.  IMPRESSION: Status post ORIF of right ankle fracture   Electronically Signed   By: Inez Catalina M.D.   On: 02/16/2015 11:03   Dg Ankle Right Port  02/16/2015   CLINICAL DATA:  Post open reduction internal fixation right ankle  EXAM: PORTABLE RIGHT ANKLE - 2 VIEW  COMPARISON:  02/16/2015  FINDINGS: Two views of the right ankle submitted. Again noted metallic fixation plate and screws in distal tibia and fibula. There is anatomic alignment. Ankle mortise is preserved.  IMPRESSION: Metallic fixation plate and screws in distal tibia and fibula. There is anatomic alignment.   Electronically Signed   By: Lahoma Crocker M.D.   On: 02/16/2015 11:37   Dg C-arm 1-60 Min  02/16/2015   CLINICAL DATA:  Right ankle fracture  EXAM: DG C-ARM 61-120 MIN; RIGHT ANKLE - COMPLETE 3+ VIEW  COMPARISON:  02/09/2015  FLUOROSCOPY TIME:  Radiation Exposure Index (as provided by the fluoroscopic device): Not available  If the device does not provide the exposure index:  Fluoroscopy Time:  33 seconds  Number of Acquired Images:  3  FINDINGS: Fixation sideplate is noted along the distal tibia and distal fibula with multiple  fixation screws. The fracture fragments are in near anatomic alignment.  IMPRESSION: Status post ORIF of right ankle fracture   Electronically Signed   By: Inez Catalina M.D.   On: 02/16/2015 11:03    Scheduled Meds: . amLODipine  10 mg Oral Daily  . aspirin EC  81 mg Oral Daily  . atorvastatin  40 mg Oral QPM  . cinacalcet  30 mg Oral Q breakfast  . cloNIDine  0.3 mg Oral 3 times per day  . clopidogrel  75 mg Oral Q breakfast  . darbepoetin (ARANESP) injection - DIALYSIS  60 mcg Intravenous Q Wed-HD  . docusate sodium  100 mg Oral BID  . [START ON 02/22/2015] ferric gluconate (FERRLECIT/NULECIT) IV  62.5 mg Intravenous  Q Wed-HD  . heparin  3,000 Units Dialysis Once in dialysis  . heparin  5,000 Units Subcutaneous 3 times per day  . hydrALAZINE  50 mg Oral 3 times per day  . insulin aspart  0-15 Units Subcutaneous TID WC  . insulin detemir  20 Units Subcutaneous QHS  . isosorbide mononitrate  60 mg Oral Daily  . labetalol  300 mg Oral 3 times per day  . lamoTRIgine  100 mg Oral QHS  . minoxidil  10 mg Oral BID  . sevelamer carbonate  1,600 mg Oral TID AC & HS  . sodium chloride  3 mL Intravenous Q12H  . sodium chloride  3 mL Intravenous Q12H  . sodium chloride  3 mL Intravenous Q12H   Continuous Infusions:  Antibiotics Given (last 72 hours)    Date/Time Action Medication Dose   02/16/15 0920 Given   clindamycin (CLEOCIN) IVPB 900 mg 900 mg     Time spent: 20 min  [facility] [email or web address], password [facility] 02/17/2015, 1:35 PM  LOS: 8 days

## 2015-02-17 NOTE — Progress Notes (Signed)
PT Cancellation Note  Patient Details Name: [name] MRN: [id number] DOB: 1976-05-24   Cancelled Treatment:    Reason Eval/Treat Not Completed: Patient declined, no reason specified (Pt refusing therapy today).  She is writhing in bed because of the pain and reports "I don't do anything on my dialysis days even when I'm feeling good", "I am too weak right now".  RN notified that pt requests pain medicine.  PT will continue to follow acutely.  Joslyn Hy PT, DPT [phone] Pager: [phone] 02/17/2015, 2:31 PM

## 2015-02-17 NOTE — Progress Notes (Signed)
[facility] Progress Note   Subjective: alert no distress on HD  Filed Vitals:   02/17/15 0930 02/17/15 1000 02/17/15 1030 02/17/15 1100  BP: 162/70 131/101 162/74 141/83  Pulse: 75 80 77 77  Temp:      TempSrc:      Resp:      Height:      Weight:    109.2 kg (240 lb 11.9 oz)  SpO2:       Exam: Alert, no distress No jvd Chest clear bilat RRR 2/6 SEM ABd soft ntnd +bs No LE edema, cast on RLE LFA AVF+bruit Neuro is alert , ox 3  NW MWF   5h  2/2 bath  105kg  Heparin 3000  LFA AVF Venofer 50/wk Mircera 75 q 2wks last 9.7      Assessment: 1 Ankle fx w ORIF 9/22 2 ESRD 3 Anemia stable 4 HTN multiple meds, up 4k 5 DM on insulin 6 MBD cont meds  Plan - HD today, UF to dry wt    Kelly Splinter MD  pager [phone]    cell [phone]  02/17/2015, 11:10 AM     Recent Labs Lab 02/15/15 0738 02/15/15 1313 02/16/15 1009 02/17/15 0715  NA 132* 138 134* 131*  K 5.4* 3.9 5.6* 5.7*  CL 95* 96*  --  95*  CO2 23 30  --  23  GLUCOSE 200* 54* 235* 206*  BUN 62* 28*  --  52*  CREATININE 11.04* 6.44*  --  10.34*  CALCIUM 9.1 8.6*  --  9.0  PHOS 9.3* 4.9*  --  7.9*    Recent Labs Lab 02/15/15 0738 02/15/15 1313 02/17/15 0715  ALBUMIN 3.0* 3.0* 2.8*    Recent Labs Lab 02/15/15 1313 02/16/15 0431 02/16/15 1009 02/17/15 0715  WBC 4.0 6.9  --  8.2  NEUTROABS  --  4.5  --   --   HGB 9.0* 8.7* 9.5* 7.8*  HCT 28.2* 27.5* 28.0* 24.3*  MCV 94.6 98.2  --  97.6  PLT 137* 153  --  176   . amLODipine  10 mg Oral Daily  . aspirin EC  81 mg Oral Daily  . atorvastatin  40 mg Oral QPM  . cinacalcet  30 mg Oral Q breakfast  . cloNIDine  0.3 mg Oral 3 times per day  . clopidogrel  75 mg Oral Q breakfast  . darbepoetin (ARANESP) injection - DIALYSIS  60 mcg Intravenous Q Wed-HD  . docusate sodium  100 mg Oral BID  . [START ON 02/22/2015] ferric gluconate (FERRLECIT/NULECIT) IV  62.5 mg Intravenous Q Wed-HD  . heparin  3,000 Units Dialysis Once in  dialysis  . heparin  5,000 Units Subcutaneous 3 times per day  . hydrALAZINE  50 mg Oral 3 times per day  . insulin aspart  0-15 Units Subcutaneous TID WC  . insulin detemir  20 Units Subcutaneous QHS  . isosorbide mononitrate  60 mg Oral Daily  . labetalol  300 mg Oral 3 times per day  . lamoTRIgine  100 mg Oral QHS  . minoxidil  10 mg Oral BID  . sevelamer carbonate  1,600 mg Oral TID AC & HS  . sodium chloride  3 mL Intravenous Q12H  . sodium chloride  3 mL Intravenous Q12H  . sodium chloride  3 mL Intravenous Q12H     sodium chloride, sodium chloride, sodium chloride, sodium chloride, acetaminophen, alteplase, diphenhydrAMINE, heparin, HYDROcodone-acetaminophen, HYDROmorphone (DILAUDID) injection, lidocaine (PF), lidocaine-prilocaine, methocarbamol (ROBAXIN)  IV,  methocarbamol, nitroGLYCERIN, ondansetron **OR** ondansetron (ZOFRAN) IV, oxyCODONE, pentafluoroprop-tetrafluoroeth, sodium chloride, sodium chloride

## 2015-02-18 DIAGNOSIS — N189 Chronic kidney disease, unspecified: Secondary | ICD-10-CM

## 2015-02-18 DIAGNOSIS — E1122 Type 2 diabetes mellitus with diabetic chronic kidney disease: Secondary | ICD-10-CM

## 2015-02-18 LAB — CBC
HEMATOCRIT: 26.3 % — AB (ref 36.0–46.0)
Hemoglobin: 8.3 g/dL — ABNORMAL LOW (ref 12.0–15.0)
MCH: 31.1 pg (ref 26.0–34.0)
MCHC: 31.6 g/dL (ref 30.0–36.0)
MCV: 98.5 fL (ref 78.0–100.0)
Platelets: 192 10*3/uL (ref 150–400)
RBC: 2.67 MIL/uL — AB (ref 3.87–5.11)
RDW: 15.8 % — AB (ref 11.5–15.5)
WBC: 5.8 10*3/uL (ref 4.0–10.5)

## 2015-02-18 LAB — GLUCOSE, CAPILLARY
GLUCOSE-CAPILLARY: 88 mg/dL (ref 65–99)
Glucose-Capillary: 241 mg/dL — ABNORMAL HIGH (ref 65–99)
Glucose-Capillary: 96 mg/dL (ref 65–99)

## 2015-02-18 MED ORDER — ASPIRIN 81 MG PO TBEC: 81.0000 mg | DELAYED_RELEASE_TABLET | Freq: Every day | ORAL | Status: DC

## 2015-02-18 MED ORDER — OXYCODONE HCL 5 MG PO TABS: 5.0000 mg | ORAL_TABLET | ORAL | Status: DC | PRN

## 2015-02-18 MED ORDER — DARBEPOETIN ALFA 150 MCG/0.3ML IJ SOSY: 150.0000 ug | PREFILLED_SYRINGE | INTRAMUSCULAR | Status: DC

## 2015-02-18 MED ORDER — GABAPENTIN 600 MG PO TABS: 600.0000 mg | ORAL_TABLET | Freq: Every day | ORAL | Status: DC

## 2015-02-18 MED ORDER — METHOCARBAMOL 500 MG PO TABS: 500.0000 mg | ORAL_TABLET | Freq: Four times a day (QID) | ORAL | Status: DC | PRN

## 2015-02-18 MED ORDER — DOCUSATE SODIUM 100 MG PO CAPS: 100.0000 mg | ORAL_CAPSULE | Freq: Two times a day (BID) | ORAL | Status: DC

## 2015-02-18 NOTE — Progress Notes (Signed)
Patient was ready to discharge and requested a knee scooter.There is no order for the knee scooter in the chart. Paged Dr. Marcelino Scot for clarification.  The knee scooter was not recommended by PT or OT. Patient left  while i am waiting the call back from Dr. Marcelino Scot. Charge nurse rushed to get the discharge papers to patient husband.

## 2015-02-18 NOTE — Discharge Summary (Signed)
Physician Discharge Summary  [name] ZHG:[id number] DOB: 1975/12/25 DOA: 02/09/2015  PCP: Merrilee Seashore, MD  Admit date: 02/09/2015 Discharge date: 02/18/2015  Time spent: greater than 30 minutes  Discharge Diagnoses:  Principal Problem:   Bimalleolar ankle fracture Active Problems:   Essential hypertension, benign   End stage renal disease on dialysis   Coronary artery disease   Hyperlipidemia   CAD (coronary artery disease)   DM (diabetes mellitus), type 2 with renal complications Hyperkalemia neuropathy  Discharge Condition: stable  Diet recommendation: diabetic renal  Filed Weights   02/17/15 0500 02/17/15 1100 02/18/15 0500  Weight: 113.7 kg (250 lb 10.6 oz) 109.2 kg (240 lb 11.9 oz) 103 kg (227 lb 1.2 oz)    History of present illness:  39 y.o. female, with past medical history of diabetes mellitus, coronary artery disease, peripheral neuropathy, end-stage renal disease on hemodialysis, peripheral vascular disease,  ground-level fall on 02/09/2015, with bimalleolar right ankle fracture, admitted by Dr. Einar Gip for cardiac cath, as part of preop clearance, as patient was planned for cardiac cath 9/27. admitted for cardiac cath on 9/16 significant for noncritical coronary artery disease with normal LV systolic function. cardiology consulted hospitalist service to resume care and given no cardiac issues  Hospital Course:  Orthopedics and nephrology consulted. Patient had HD per usual schedule.  After swelling improved, taken to OR for ORIF ankle.  Pain control proved difficult.  Ortho felt much of patients pain was related to neuropathy. Had been on gabapentin previously and followed by neurology.  Ortho recommends resuming gabapentin 600 mg qhs and cleared for discharge. Other medical problems remained stable  Procedures: OPEN REDUCTION INTERNAL FIXATION (ORIF) RIGHT ANKLE FRACTURE (Right)  Discharge Exam: Filed Vitals:   02/18/15 1014  BP: 134/78  Pulse: 74   Temp: 98.6 F (37 C)  Resp: 18    General: working with PT Ext: splint in place  Discharge Instructions   Discharge Instructions    Diet - low sodium heart healthy    Complete by:  As directed      Diet Carb Modified    Complete by:  As directed           Current Discharge Medication List    START taking these medications   Details  aspirin EC 81 MG EC tablet Take 1 tablet (81 mg total) by mouth daily.    docusate sodium (COLACE) 100 MG capsule Take 1 capsule (100 mg total) by mouth 2 (two) times daily. Qty: 10 capsule, Refills: 0    gabapentin (NEURONTIN) 600 MG tablet Take 1 tablet (600 mg total) by mouth at bedtime. Qty: 30 tablet, Refills: 0    methocarbamol (ROBAXIN) 500 MG tablet Take 1 tablet (500 mg total) by mouth every 6 (six) hours as needed for muscle spasms. Qty: 20 tablet, Refills: 0    oxyCODONE (OXY IR/ROXICODONE) 5 MG immediate release tablet Take 1-2 tablets (5-10 mg total) by mouth every 4 (four) hours as needed for severe pain. Qty: 30 tablet, Refills: 0      CONTINUE these medications which have NOT CHANGED   Details  amLODipine (NORVASC) 10 MG tablet Take 1 tablet (10 mg total) by mouth daily. Qty: 30 tablet, Refills: 0    atorvastatin (LIPITOR) 40 MG tablet Take 40 mg by mouth daily.    cinacalcet (SENSIPAR) 30 MG tablet Take 30 mg by mouth daily.    cloNIDine (CATAPRES) 0.3 MG tablet Take 1 tablet (0.3 mg total) by mouth 3 (three) times daily.  Qty: 90 tablet, Refills: 0    clopidogrel (PLAVIX) 75 MG tablet Take 75 mg by mouth at bedtime.     HUMALOG MIX 50/50 KWIKPEN (50-50) 100 UNIT/ML Kwikpen Inject 26-28 Units into the skin 2 (two) times daily. 28 in the morning and 26 in the evening.    hydrALAZINE (APRESOLINE) 50 MG tablet Take 1 tablet (50 mg total) by mouth every 8 (eight) hours. Qty: 90 tablet, Refills: 0    isosorbide mononitrate (IMDUR) 60 MG 24 hr tablet Take 60 mg by mouth daily.    labetalol (NORMODYNE) 300 MG tablet  Take 1 tablet (300 mg total) by mouth 3 (three) times daily. Qty: 270 tablet, Refills: 3   Associated Diagnoses: Essential hypertension, benign    lamoTRIgine (LAMICTAL) 25 MG tablet Take 100 mg by mouth at bedtime.    minoxidil (LONITEN) 10 MG tablet Take 10 mg by mouth 2 (two) times daily.    nitroGLYCERIN (NITROSTAT) 0.4 MG SL tablet Place 0.4 mg under the tongue every 5 (five) minutes as needed. For chest pain    RENVELA 800 MG tablet Take 1 tablet by mouth 3 (three) times daily with meals.       STOP taking these medications     HYDROcodone-acetaminophen (NORCO/VICODIN) 5-325 MG per tablet      chlorpheniramine-HYDROcodone (TUSSIONEX PENNKINETIC ER) 10-8 MG/5ML SUER      ondansetron (ZOFRAN) 4 MG tablet        Allergies  Allergen Reactions  . Cephalexin Other (See Comments)    Reaction unknown  . Sulfamethoxazole-Trimethoprim Other (See Comments)    Thrush   Follow-up Information    Follow up with HANDY,MICHAEL H, MD In 10 days.   Specialty:  Orthopedic Surgery   Why:  for splint removal   Contact information:   [address] [place] [address] [phone]       Follow up with Audery Amel, MD.   Specialties:  Psychiatry, Neurology   Why:  for neuropathy   Contact information:   [address] [place] [address] [phone]        The results of significant diagnostics from this hospitalization (including imaging, microbiology, ancillary and laboratory) are listed below for reference.    Significant Diagnostic Studies: X-ray Chest Pa And Lateral  02/10/2015   CLINICAL DATA:  Coronary artery disease.  EXAM: CHEST  2 VIEW  COMPARISON:  11/11/2014  FINDINGS: Progressive cardiomegaly compared to prior exam. Mediastinal contours are unchanged. Minimal vascular congestion. There is no pulmonary edema, confluent airspace disease, pleural effusion or pneumothorax. No acute osseous abnormalities are seen pre  IMPRESSION:  Progressive cardiomegaly.  Mild vascular congestion.   Electronically Signed   By: Jeb Levering M.D.   On: 02/10/2015 03:16   Dg Ankle Complete Right  02/16/2015   CLINICAL DATA:  Right ankle fracture  EXAM: DG C-ARM 61-120 MIN; RIGHT ANKLE - COMPLETE 3+ VIEW  COMPARISON:  02/09/2015  FLUOROSCOPY TIME:  Radiation Exposure Index (as provided by the fluoroscopic device): Not available  If the device does not provide the exposure index:  Fluoroscopy Time:  33 seconds  Number of Acquired Images:  3  FINDINGS: Fixation sideplate is noted along the distal tibia and distal fibula with multiple fixation screws. The fracture fragments are in near anatomic alignment.  IMPRESSION: Status post ORIF of right ankle fracture   Electronically Signed   By: Inez Catalina M.D.   On: 02/16/2015 11:03   Dg  Ankle Complete Right  02/09/2015   CLINICAL DATA:  Pain following fall in parking lot earlier today  EXAM: RIGHT ANKLE - COMPLETE 3+ VIEW  COMPARISON:  April 10, 2007  FINDINGS: Frontal, oblique, and lateral views obtained. There is a comminuted fracture of the medial aspect of the distal tibial metaphysis with several mildly displaced fracture fragments. There is a fracture of the posterior aspect of the distal tibial metaphysis with alignment essentially anatomic. There is a comminuted fracture of the lateral malleolus with several distal fracture fragments displaced inferiorly. There is marked soft tissue swelling laterally. There is a small joint effusion. The ankle mortise appears disrupted on the oblique view. There are foci of arterial vascular calcification.  IMPRESSION: Bimalleolar type fracture with displaced fractures medially and laterally with a nondisplaced fracture of the posterior distal tibial metaphysis. There is ankle mortise disruption. There is marked soft tissue swelling laterally with joint effusion. There are multiple foci of arterial vascular calcification.   Electronically Signed   By: Lowella Grip III M.D.   On: 02/09/2015 14:24   Dg Knee Right Port  02/13/2015   CLINICAL DATA:  Injury January 2016.  Pain.  EXAM: PORTABLE RIGHT KNEE - 1-2 VIEW  COMPARISON:  None.  FINDINGS: New no acute bony or joint abnormality identified. No evidence of fracture dislocation. Osteopenia is noted. Atherosclerotic vascular calcification noted.  IMPRESSION: 1. No acute abnormality.  Diffuse osteopenia. 2. Peripheral vascular disease .   Electronically Signed   By: Marcello Moores  Register   On: 02/13/2015 12:29   Dg Ankle Right Port  02/16/2015   CLINICAL DATA:  Post open reduction internal fixation right ankle  EXAM: PORTABLE RIGHT ANKLE - 2 VIEW  COMPARISON:  02/16/2015  FINDINGS: Two views of the right ankle submitted. Again noted metallic fixation plate and screws in distal tibia and fibula. There is anatomic alignment. Ankle mortise is preserved.  IMPRESSION: Metallic fixation plate and screws in distal tibia and fibula. There is anatomic alignment.   Electronically Signed   By: Lahoma Crocker M.D.   On: 02/16/2015 11:37   Dg Foot Complete Right  02/13/2015   CLINICAL DATA:  Previous fracture. Throbbing, constant right ankle pain.  EXAM: RIGHT FOOT COMPLETE - 3+ VIEW  COMPARISON:  CT foot dated 09/08/2014.  FINDINGS: There has been interval healing at the previously noted fracture in the mid to distal right fifth metatarsal bone.  There is now seen a slightly displaced fracture at the base of the right fifth metatarsal bone, appears new compared to the previous CT, with adjacent soft tissue swelling/edema.  No other osseous fracture or dislocation identified. No joint space malalignment. Extensive small vessel atherosclerotic calcifications are again seen throughout soft tissues of the right foot.  IMPRESSION: 1. New slightly displaced avulsion fracture at the base of the right fifth metatarsal bone with overlying soft tissue swelling/edema. 2. Interval healing of the fracture in the mid to distal right fifth  metatarsal bone.   Electronically Signed   By: Franki Cabot M.D.   On: 02/13/2015 12:41   Dg C-arm 1-60 Min  02/16/2015   CLINICAL DATA:  Right ankle fracture  EXAM: DG C-ARM 61-120 MIN; RIGHT ANKLE - COMPLETE 3+ VIEW  COMPARISON:  02/09/2015  FLUOROSCOPY TIME:  Radiation Exposure Index (as provided by the fluoroscopic device): Not available  If the device does not provide the exposure index:  Fluoroscopy Time:  33 seconds  Number of Acquired Images:  3  FINDINGS: Fixation sideplate is  noted along the distal tibia and distal fibula with multiple fixation screws. The fracture fragments are in near anatomic alignment.  IMPRESSION: Status post ORIF of right ankle fracture   Electronically Signed   By: Inez Catalina M.D.   On: 02/16/2015 11:03    Microbiology: No results found for this or any previous visit (from the past 240 hour(s)).   Labs: Basic Metabolic Panel:  Recent Labs Lab 02/13/15 1427 02/14/15 1523 02/15/15 0738 02/15/15 1313 02/16/15 1009 02/17/15 0715  NA 133*  --  132* 138 134* 131*  K 5.9* 5.1 5.4* 3.9 5.6* 5.7*  CL 95*  --  95* 96*  --  95*  CO2 24  --  23 30  --  23  GLUCOSE 120*  --  200* 54* 235* 206*  BUN 81*  --  62* 28*  --  52*  CREATININE 12.31*  --  11.04* 6.44*  --  10.34*  CALCIUM 8.7*  --  9.1 8.6*  --  9.0  PHOS 9.8*  --  9.3* 4.9*  --  7.9*   Liver Function Tests:  Recent Labs Lab 02/13/15 1427 02/15/15 0738 02/15/15 1313 02/17/15 0715  ALBUMIN 3.0* 3.0* 3.0* 2.8*   No results for input(s): LIPASE, AMYLASE in the last 168 hours. No results for input(s): AMMONIA in the last 168 hours. CBC:  Recent Labs Lab 02/13/15 1427 02/15/15 1313 02/16/15 0431 02/16/15 1009 02/17/15 0715 02/18/15 0530  WBC 7.0 4.0 6.9  --  8.2 5.8  NEUTROABS  --   --  4.5  --   --   --   HGB 8.7* 9.0* 8.7* 9.5* 7.8* 8.3*  HCT 26.6* 28.2* 27.5* 28.0* 24.3* 26.3*  MCV 94.0 94.6 98.2  --  97.6 98.5  PLT 121* 137* 153  --  176 192   Cardiac Enzymes: No results for  input(s): CKTOTAL, CKMB, CKMBINDEX, TROPONINI in the last 168 hours. BNP: BNP (last 3 results)  Recent Labs  10/30/14 1932  BNP 2024.0*    ProBNP (last 3 results) No results for input(s): PROBNP in the last 8760 hours.  CBG:  Recent Labs Lab 02/17/15 1701 02/17/15 2134 02/18/15 0224 02/18/15 0635 02/18/15 1127  GLUCAP 194* 163* 241* 96 88       Signed:  SULLIVAN,CORINNA L  [facility] 02/18/2015, 11:37 AM

## 2015-02-18 NOTE — Progress Notes (Signed)
OT Cancellation Note  Patient Details Name: WAYNETTA METHENY MRN: [id number] DOB: 08/17/1975   Cancelled Treatment:    Reason Eval/Treat Not Completed: Other (comment). In to see pt about my role as OT. She reports that she and her husband (in room) have figured out how they are going to manage BADLs at home and thus she does not need OT services. They are just waiting on a 3n1 an a knee scooter. I followed up with CM and she has 3n1 ready for pt, just waiting on MD to write order for knee scooter and RN has paged MD 2 times about this. I offered for pt to try our knee scooter here to make sure she is ok with it, she politely declined. No OT needs per pt, we will sign off.  Almon Register [phone] 02/18/2015, 1:34 PM

## 2015-02-18 NOTE — Progress Notes (Signed)
Subjective:  tolerated HD yesterday/ minimal post op pain/ husband in room   Objective Vital signs in last 24 hours: Filed Vitals:   02/17/15 1500 02/17/15 2033 02/17/15 2216 02/18/15 0500  BP: 156/63 163/62 174/69   Pulse: 83 86 82   Temp:  100.9 F (38.3 C) 99.8 F (37.7 C)   TempSrc:  Oral Oral   Resp:  17 16   Height:      Weight:    103 kg (227 lb 1.2 oz)  SpO2:  95% 95%    Weight change: -4.5 kg (-9 lb 14.7 oz)  Physical Exam: General: Alert OX3, NAD , appropriate  Baseline MS Heart: RRR, 1/6sem no rub or gallop Lungs: CTA bilat  Abdomen: Obese, soft , NT, ND Extremities:Left Ankle cast, no R pedal edema  Dialysis Access: pos bruit L FA AVF   NW MWF 5h 2/2 bath 105kg Heparin 3000 LFA AVF Venofer 50/wk Mircera 75 q 2wks last 9.7  Problem/Plan: 1 Ankle fx w ORIF 9/22- per Dr. Marcelino Scot dc today 2 ESRD- on schedule MWF , edw about same with new cast wt. 3. Low grade temp -Yesterday evening /this am 98.0 / Am wbc nl denies cough or dysuria/ AVF does not appear infected./ monitor trend .  4 Anemia - HGB 7.8 >8.3 this am  Will incr esa  5 HTN -multiple meds, up 4k yest and 4 l UF with bp stable / this am 134/78 bp 6 DM on insulin 6 MBD cont meds  [name] Haber, PA-C [facility] [phone] 02/18/2015,9:27 AM  LOS: 9 days   Pt seen, examined and agree w A/P as above. For DC today.  Kelly Splinter MD pager [phone]    cell [phone] 02/18/2015, 11:43 AM    Labs: Basic Metabolic Panel:  Recent Labs Lab 02/15/15 0738 02/15/15 1313 02/16/15 1009 02/17/15 0715  NA 132* 138 134* 131*  K 5.4* 3.9 5.6* 5.7*  CL 95* 96*  --  95*  CO2 23 30  --  23  GLUCOSE 200* 54* 235* 206*  BUN 62* 28*  --  52*  CREATININE 11.04* 6.44*  --  10.34*  CALCIUM 9.1 8.6*  --  9.0  PHOS 9.3* 4.9*  --  7.9*   Liver Function Tests:  Recent Labs Lab 02/15/15 0738 02/15/15 1313 02/17/15 0715  ALBUMIN 3.0* 3.0* 2.8*    CBC:  Recent Labs Lab  02/13/15 1427 02/15/15 1313 02/16/15 0431 02/16/15 1009 02/17/15 0715 02/18/15 0530  WBC 7.0 4.0 6.9  --  8.2 5.8  NEUTROABS  --   --  4.5  --   --   --   HGB 8.7* 9.0* 8.7* 9.5* 7.8* 8.3*  HCT 26.6* 28.2* 27.5* 28.0* 24.3* 26.3*  MCV 94.0 94.6 98.2  --  97.6 98.5  PLT 121* 137* 153  --  176 192    CBG:  Recent Labs Lab 02/17/15 1203 02/17/15 1701 02/17/15 2134 02/18/15 0224 02/18/15 0635  GLUCAP 164* 194* 163* 241* 96    Studies/Results: Dg Ankle Complete Right  02/16/2015   CLINICAL DATA:  Right ankle fracture  EXAM: DG C-ARM 61-120 MIN; RIGHT ANKLE - COMPLETE 3+ VIEW  COMPARISON:  02/09/2015  FLUOROSCOPY TIME:  Radiation Exposure Index (as provided by the fluoroscopic device): Not available  If the device does not provide the exposure index:  Fluoroscopy Time:  33 seconds  Number of Acquired Images:  3  FINDINGS: Fixation sideplate is noted along the distal tibia and distal  fibula with multiple fixation screws. The fracture fragments are in near anatomic alignment.  IMPRESSION: Status post ORIF of right ankle fracture   Electronically Signed   By: Inez Catalina M.D.   On: 02/16/2015 11:03   Dg Ankle Right Port  02/16/2015   CLINICAL DATA:  Post open reduction internal fixation right ankle  EXAM: PORTABLE RIGHT ANKLE - 2 VIEW  COMPARISON:  02/16/2015  FINDINGS: Two views of the right ankle submitted. Again noted metallic fixation plate and screws in distal tibia and fibula. There is anatomic alignment. Ankle mortise is preserved.  IMPRESSION: Metallic fixation plate and screws in distal tibia and fibula. There is anatomic alignment.   Electronically Signed   By: Lahoma Crocker M.D.   On: 02/16/2015 11:37   Dg C-arm 1-60 Min  02/16/2015   CLINICAL DATA:  Right ankle fracture  EXAM: DG C-ARM 61-120 MIN; RIGHT ANKLE - COMPLETE 3+ VIEW  COMPARISON:  02/09/2015  FLUOROSCOPY TIME:  Radiation Exposure Index (as provided by the fluoroscopic device): Not available  If the device does not  provide the exposure index:  Fluoroscopy Time:  33 seconds  Number of Acquired Images:  3  FINDINGS: Fixation sideplate is noted along the distal tibia and distal fibula with multiple fixation screws. The fracture fragments are in near anatomic alignment.  IMPRESSION: Status post ORIF of right ankle fracture   Electronically Signed   By: Inez Catalina M.D.   On: 02/16/2015 11:03   Medications:   . amLODipine  10 mg Oral Daily  . aspirin EC  81 mg Oral Daily  . atorvastatin  40 mg Oral QPM  . cinacalcet  30 mg Oral Q breakfast  . cloNIDine  0.3 mg Oral 3 times per day  . clopidogrel  75 mg Oral Q breakfast  . darbepoetin (ARANESP) injection - DIALYSIS  60 mcg Intravenous Q Wed-HD  . docusate sodium  100 mg Oral BID  . [START ON 02/22/2015] ferric gluconate (FERRLECIT/NULECIT) IV  62.5 mg Intravenous Q Wed-HD  . heparin  3,000 Units Dialysis Once in dialysis  . heparin  5,000 Units Subcutaneous 3 times per day  . hydrALAZINE  50 mg Oral 3 times per day  . insulin aspart  0-15 Units Subcutaneous TID WC  . insulin detemir  20 Units Subcutaneous QHS  . isosorbide mononitrate  60 mg Oral Daily  . labetalol  300 mg Oral 3 times per day  . lamoTRIgine  50 mg Oral BID  . minoxidil  10 mg Oral BID  . sevelamer carbonate  4,800 mg Oral TID AC  . sodium chloride  3 mL Intravenous Q12H  . sodium chloride  3 mL Intravenous Q12H  . sodium chloride  3 mL Intravenous Q12H

## 2015-02-18 NOTE — Progress Notes (Signed)
Physical Therapy Treatment Patient Details Name: [name] MRN: [id number] DOB: 13-May-1976 Today's Date: 02/18/2015    History of Present Illness 39 year old female with ESRD, PVD, CAD, DM, II and recent bimalleolar fracture of right ankle.    PT Comments    Patient requesting drop arm commode although advised most likely, insurance would not cover. Patient's husband already purchased manual w/c with ELR and they report they already have crutches at home (she declined to utilize any device today with PT).  She requests knee scooter. PT advised about safety precautions secondary to pain medications and unsure if pt could tolerate pressure on her shin if she utilized scooter.  Offered to practice with patient to ensure safety and tolerance with knee scooter which she declined.  Patient demo limited standing tolerance (because of pain), therefore recommend she perform scoot/pivot transfer to w/c and utilize w/c for mobility until further notice.  She reports independence with exercises (advised pt to perform SAQ, SLR, hip add/abduct in supine, elevate leg for pain management to ensure compliance). Discharge therapy services secondary to anticipated d/c home later today.   Follow Up Recommendations  No PT follow up;Supervision for mobility/OOB     Equipment Recommendations  3in1 (PT);Other (comment) (pt requesting knee scooter although declined to demo safety)    Recommendations for Other Services       Precautions / Restrictions Precautions Precautions: Fall Restrictions Weight Bearing Restrictions: Yes RLE Weight Bearing: Non weight bearing Other Position/Activity Restrictions: short leg splint Rt lower leg    Mobility  Bed Mobility Overal bed mobility: Independent             General bed mobility comments: supine to/from sit independently with bed flat  Transfers Overall transfer level: Needs assistance Equipment used: 1 person hand held assist Transfers: Sit to/from  Stand Sit to Stand: Min assist         General transfer comment: held to her husband, standing tolerance ~20 sec (pain)  Ambulation/Gait     Assistive device: None       General Gait Details: pt declined to use RW or to ambulate today   Stairs            Wheelchair Mobility    Modified Rankin (Stroke Patients Only)       Balance Overall balance assessment: Needs assistance Sitting-balance support: No upper extremity supported;Feet supported Sitting balance-Leahy Scale: Good     Standing balance support: Bilateral upper extremity supported Standing balance-Leahy Scale: Poor Standing balance comment: held to her husband's shoulders for balance                    Cognition Arousal/Alertness: Awake/alert Behavior During Therapy: WFL for tasks assessed/performed Overall Cognitive Status: Within Functional Limits for tasks assessed                      Exercises      General Comments General comments (skin integrity, edema, etc.): swollen appearing toes Rt foot      Pertinent Vitals/Pain Pain Assessment: 0-10 Pain Score: 8  Pain Location: Rt lower leg and ankle Pain Descriptors / Indicators: Throbbing;Aching;Heaviness;Sharp;Shooting Pain Intervention(s): Limited activity within patient's tolerance;Monitored during session;Premedicated before session;Repositioned    Home Living                      Prior Function            PT Goals (current goals can now be found  in the care plan section) Acute Rehab PT Goals Patient Stated Goal: go home as soon as possible Progress towards PT goals: Goals met/education completed, patient discharged from PT    Frequency       PT Plan Discharge plan needs to be updated    Co-evaluation             End of Session   Activity Tolerance: Patient limited by pain Patient left: in bed;with call bell/phone within reach;with family/visitor present     Time: 1120-1140 PT Time  Calculation (min) (ACUTE ONLY): 20 min  Charges:                       G CodesMalka So, PT [phone]  [place] 02/18/2015, 3:39 PM

## 2015-02-18 NOTE — Plan of Care (Signed)
Problem: Acute Rehab PT Goals(only PT should resolve) Goal: Pt Will Transfer Bed To Chair/Chair To Bed Outcome: Not Met (add Reason) Pt declined to get to chair Goal: Pt Will Ambulate Outcome: Not Met (add Reason) Pt declined to ambulate

## 2015-02-18 NOTE — Care Management Note (Addendum)
Case Management Note  Patient Details  Name: [name] MRN: [id number] Date of Birth: [birth year]  Subjective/Objective:                    Action/Plan: DME   Expected Discharge Date: 02/18/15                 Expected Discharge Plan:  Home/Self Care  In-House Referral:  Clinical Social Work  Discharge planning Services   CM   Post Acute Care Choice:    Choice offered to:     DME Arranged:  3-N-1, Other see comment DME Agency:  [place]:    [place] Agency:     Status of Service:  Completed, signed off  Medicare Important Message Given:  Yes-third notification given Date Medicare IM Given:    Medicare IM give by:    Date Additional Medicare IM Given:    Additional Medicare Important Message give by:     If discussed at [place] of Stay Meetings, dates discussed:    Additional Comments: per RN, pt is requesting a knee scooter. Pt and husband stated that the doctor informed them that she can have one. PT worked with pt this am. Spoke with Cyril Mourning, PT to discuss her recommendations. She recommended a 3-n-1 BSC. She reports that pt discussed with her the knee scooter. Cyril Mourning offered to bring a knee scooter to the room to evaluate her but she declined. Pt informed Cyril Mourning that her sister has a knee scooter and she knows how to use it. RN contacted MD and she received an order for the scooter. Pt has a W/C and crutches. Met with pt and husband. They don't want to wait for the scooter to be delivered. She is ready to go home. Provided the 3-n-1 BSC. Contacted Jeff at [facility] and he stated that pt has to go to the [facility] store to get the knee scooter. Contacted pt at [phone] and left a VM regarding the knee scooter and provided [facility] phone #.  Norina Buzzard, RN 02/18/2015, 2:57 PM

## 2015-02-18 NOTE — Progress Notes (Signed)
[facility] (OTS)  Subjective: 2 Days Post-Op Procedure(s) (LRB): OPEN REDUCTION INTERNAL FIXATION (ORIF) RIGHT ANKLE FRACTURE (Right) Patient reports pain as mild but neuropathic component moderate. Eager to go home.  Objective: Current Vitals Blood pressure 134/78, pulse 74, temperature 98.6 F (37 C), temperature source Oral, resp. rate 18, height 6\' 3"  (1.905 m), weight 227 lb 1.2 oz (103 kg), last menstrual period 01/09/2015, SpO2 99 %. Vital signs in last 24 hours: Temp:  [98.6 F (37 C)-100.9 F (38.3 C)] 98.6 F (37 C) (09/24 1014) Pulse Rate:  [64-86] 74 (09/24 1014) Resp:  [16-18] 18 (09/24 1014) BP: (134-174)/(53-78) 134/78 mmHg (09/24 1014) SpO2:  [95 %-100 %] 99 % (09/24 1014) Weight:  [227 lb 1.2 oz (103 kg)] 227 lb 1.2 oz (103 kg) (09/24 0500)  Intake/Output from previous day: 09/23 0701 - 09/24 0700 In: 460 [P.O.:460] Out: 4000   LABS  Recent Labs  02/15/15 1313 02/16/15 0431 02/16/15 1009 02/17/15 0715 02/18/15 0530  HGB 9.0* 8.7* 9.5* 7.8* 8.3*    Recent Labs  02/17/15 0715 02/18/15 0530  WBC 8.2 5.8  RBC 2.49* 2.67*  HCT 24.3* 26.3*  PLT 176 192    Recent Labs  02/15/15 1313 02/16/15 1009 02/17/15 0715  NA 138 134* 131*  K 3.9 5.6* 5.7*  CL 96*  --  95*  CO2 30  --  23  BUN 28*  --  52*  CREATININE 6.44*  --  10.34*  GLUCOSE 54* 235* 206*  CALCIUM 8.6*  --  9.0   No results for input(s): LABPT, INR in the last 72 hours.  Physical Exam  RLE Dressing intact, clean, dry  Edema/ swelling controlled  Sens: DPN, SPN, TN intact  Motor: EHL, FHL, and lessor toe ext and flex all intact grossly  Brisk cap refill, warm to touch  Imaging Dg Ankle Right Port  02/16/2015   CLINICAL DATA:  Post open reduction internal fixation right ankle  EXAM: PORTABLE RIGHT ANKLE - 2 VIEW  COMPARISON:  02/16/2015  FINDINGS: Two views of the right ankle submitted. Again noted metallic fixation plate and screws in distal tibia and  fibula. There is anatomic alignment. Ankle mortise is preserved.  IMPRESSION: Metallic fixation plate and screws in distal tibia and fibula. There is anatomic alignment.   Electronically Signed   By: Lahoma Crocker M.D.   On: 02/16/2015 11:37    Assessment/Plan: 2 Days Post-Op Procedure(s) (LRB): OPEN REDUCTION INTERNAL FIXATION (ORIF) RIGHT ANKLE FRACTURE (Right)  1. Elevate 2. Tight glucose control 3.  F/u in 10 days with me for removal of splint and application of cast or CAM 4. F/u with her Neurologist, Dr. Linus Salmons, this week if feasible re neuropathic pain management as patient has had previous trials of several meds,some at large doses; ok to restart Neurontin at 600mg  qHS (Patient has med at home).   Altamese Sattley, MD [facility] [phone] (p)   02/18/2015, 11:07 AM

## 2015-02-20 ENCOUNTER — Encounter (HOSPITAL_COMMUNITY): Payer: Self-pay | Admitting: Orthopedic Surgery

## 2015-02-20 DIAGNOSIS — E1129 Type 2 diabetes mellitus with other diabetic kidney complication: Secondary | ICD-10-CM | POA: Diagnosis not present

## 2015-02-20 DIAGNOSIS — D509 Iron deficiency anemia, unspecified: Secondary | ICD-10-CM | POA: Diagnosis not present

## 2015-02-20 DIAGNOSIS — N2581 Secondary hyperparathyroidism of renal origin: Secondary | ICD-10-CM | POA: Diagnosis not present

## 2015-02-20 DIAGNOSIS — D631 Anemia in chronic kidney disease: Secondary | ICD-10-CM | POA: Diagnosis not present

## 2015-02-20 DIAGNOSIS — N186 End stage renal disease: Secondary | ICD-10-CM | POA: Diagnosis not present

## 2015-02-21 ENCOUNTER — Encounter (HOSPITAL_COMMUNITY): Payer: Self-pay | Admitting: *Deleted

## 2015-02-21 ENCOUNTER — Emergency Department (HOSPITAL_COMMUNITY)
Admission: EM | Admit: 2015-02-21 | Discharge: 2015-02-21 | Disposition: A | Discharge status: Home / Self Care | Payer: Medicare Other | Attending: Physician Assistant | Admitting: Physician Assistant

## 2015-02-21 DIAGNOSIS — M2540 Effusion, unspecified joint: Secondary | ICD-10-CM | POA: Diagnosis not present

## 2015-02-21 DIAGNOSIS — Z87891 Personal history of nicotine dependence: Secondary | ICD-10-CM | POA: Insufficient documentation

## 2015-02-21 DIAGNOSIS — G43909 Migraine, unspecified, not intractable, without status migrainosus: Secondary | ICD-10-CM | POA: Diagnosis not present

## 2015-02-21 DIAGNOSIS — I10 Essential (primary) hypertension: Secondary | ICD-10-CM | POA: Diagnosis not present

## 2015-02-21 DIAGNOSIS — Z8781 Personal history of (healed) traumatic fracture: Secondary | ICD-10-CM | POA: Diagnosis not present

## 2015-02-21 DIAGNOSIS — Z7901 Long term (current) use of anticoagulants: Secondary | ICD-10-CM | POA: Insufficient documentation

## 2015-02-21 DIAGNOSIS — E119 Type 2 diabetes mellitus without complications: Secondary | ICD-10-CM | POA: Insufficient documentation

## 2015-02-21 DIAGNOSIS — R110 Nausea: Secondary | ICD-10-CM | POA: Insufficient documentation

## 2015-02-21 DIAGNOSIS — Z79899 Other long term (current) drug therapy: Secondary | ICD-10-CM | POA: Diagnosis not present

## 2015-02-21 DIAGNOSIS — Z7982 Long term (current) use of aspirin: Secondary | ICD-10-CM | POA: Diagnosis not present

## 2015-02-21 DIAGNOSIS — E785 Hyperlipidemia, unspecified: Secondary | ICD-10-CM | POA: Diagnosis not present

## 2015-02-21 DIAGNOSIS — R251 Tremor, unspecified: Secondary | ICD-10-CM | POA: Diagnosis present

## 2015-02-21 DIAGNOSIS — Z794 Long term (current) use of insulin: Secondary | ICD-10-CM | POA: Insufficient documentation

## 2015-02-21 DIAGNOSIS — I2510 Atherosclerotic heart disease of native coronary artery without angina pectoris: Secondary | ICD-10-CM | POA: Insufficient documentation

## 2015-02-21 LAB — BASIC METABOLIC PANEL
Anion gap: 15 (ref 5–15)
BUN: 31 mg/dL — AB (ref 6–20)
CHLORIDE: 91 mmol/L — AB (ref 101–111)
CO2: 32 mmol/L (ref 22–32)
Calcium: 10 mg/dL (ref 8.9–10.3)
Creatinine, Ser: 7.98 mg/dL — ABNORMAL HIGH (ref 0.44–1.00)
GFR calc Af Amer: 7 mL/min — ABNORMAL LOW (ref >60)
GFR calc non Af Amer: 6 mL/min — ABNORMAL LOW (ref >60)
GLUCOSE: 143 mg/dL — AB (ref 65–99)
POTASSIUM: 4.5 mmol/L (ref 3.5–5.1)
Sodium: 138 mmol/L (ref 135–145)

## 2015-02-21 LAB — CBC
HEMATOCRIT: 31.7 % — AB (ref 36.0–46.0)
Hemoglobin: 9.6 g/dL — ABNORMAL LOW (ref 12.0–15.0)
MCH: 29.9 pg (ref 26.0–34.0)
MCHC: 30.3 g/dL (ref 30.0–36.0)
MCV: 98.8 fL (ref 78.0–100.0)
Platelets: 330 10*3/uL (ref 150–400)
RBC: 3.21 MIL/uL — ABNORMAL LOW (ref 3.87–5.11)
RDW: 15.7 % — AB (ref 11.5–15.5)
WBC: 7.4 10*3/uL (ref 4.0–10.5)

## 2015-02-21 SURGERY — LEFT HEART CATH AND CORONARY ANGIOGRAPHY
Anesthesia: LOCAL

## 2015-02-21 MED ORDER — PROMETHAZINE HCL 12.5 MG PO TABS
12.5000 mg | ORAL_TABLET | Freq: Once | ORAL | Status: DC
  Filled 2015-02-21: qty 1

## 2015-02-21 MED ORDER — PROMETHAZINE HCL 25 MG PO TABS: 25.0000 mg | ORAL_TABLET | Freq: Four times a day (QID) | ORAL | Status: DC | PRN

## 2015-02-21 NOTE — Discharge Instructions (Signed)

## 2015-02-21 NOTE — ED Notes (Signed)
Patient tolerated PO challenge well. States she is ready to go home.

## 2015-02-21 NOTE — ED Provider Notes (Signed)
CSN: [id number]     Arrival date & time 02/21/15  1724 History   First MD Initiated Contact with Patient 02/21/15 1958     Chief Complaint  Patient presents with  . Shaking  . Stuttering     (Consider location/radiation/quality/duration/timing/severity/associated sxs/prior Treatment) HPI   Patient is a 40 year old female with history of diabetes hypertension hyperlipidemia CAD CK D and recent for surgery this week presenting with feelings of nausea. Patient's had no vomiting no diarrhea. She just feels nauseous like she doesn't want eat very much. No cough no fever no abdominal pain.  Her daughter states she also feels like she is been wanting to sleep more than usual. Patient is due for dialysis tomorrow.  Past Medical History  Diagnosis Date  . DM (diabetes mellitus)     Long-term insulin  . Hypertension   . Tobacco use disorder     Discontinued March 2012  . Hyperlipidemia     Hypertriglyceridemia 449 HDL 25  . Diabetic Charcot's joint disease   . Coronary artery disease     Status post cardiac catheterization June 2012 scattered coronary artery disease/atherosclerosis with 70-80% stenosis in a small right PDA.  . Gastroparesis   . Peripheral vascular disease     Tibial occlusive disease evaluated by Dr. Kellie Simmering in August 2011. Medical therapy  . 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 CIN III (cervical intraepithelial neoplasia grade III) with severe dysplasia     S/P LEEP AND [facility]  . Chronic anemia     2nd to renal disease  . End stage renal disease on dialysis 05/02/11    [facility]; M; W, F; last time 05/01/11  . Migraines     "just on dialysis days"  . Peripheral neuropathy     related to DM  . History of abscesses in groins 12/06/2010  . Renal insufficiency     Dialysis since 2012  . Fracture of 5th metatarsal 2016    Right  . Trimalleolar fracture of ankle, closed 02/09/2015    Right   Past Surgical History  Procedure Laterality Date  . Av fistula placement  04/2010  .  Dilation and curettage of uterus  2009  . Cervical biopsy  w/ loop electrode excision      h/o  . Cervical cone biopsy      h/o  . Colposcopy    . Cardiac catheterization [name] 02/10/2015    Procedure: Left Heart Cath and Coronary Angiography;  Surgeon: Dixie Dials, MD;  Location: [facility];  Service: Cardiovascular;  Laterality: [name];  . Orif ankle fracture Right 02/09/2015  . Orif ankle fracture Right 02/16/2015    Procedure: OPEN REDUCTION INTERNAL FIXATION (ORIF) RIGHT ANKLE FRACTURE;  Surgeon: Altamese [place], MD;  Location: [facility];  Service: Orthopedics;  Laterality: Right;   Family History  Problem Relation Age of Onset  . Diabetes Mother   . Hypertension Mother   . Diabetes Father   . Hyperlipidemia Father   . Hypertension Father   . Diabetes Sister   . Breast cancer Maternal Aunt     Age 21's  . Stroke Maternal Grandmother   . Diabetes Sister    Social History  Substance Use Topics  . Smoking status: Former Smoker -- 0.30 packs/day for 10 years    Types: Cigarettes    Quit date: 08/05/2011  . Smokeless tobacco: Never Used     Comment: "quit smoking cigarettes 07/2010"  . Alcohol Use: No   OB History  Gravida Para Term Preterm AB TAB SAB Ectopic Multiple Living   2 1 1  1     1      Review of Systems  Constitutional: Negative for activity change and fatigue.  HENT: Negative for congestion and drooling.   Eyes: Negative for discharge.  Respiratory: Negative for cough and chest tightness.   Cardiovascular: Negative for chest pain.  Gastrointestinal: Negative for abdominal distention.  Genitourinary: Negative for dysuria and difficulty urinating.  Musculoskeletal: Positive for joint swelling.  Skin: Negative for rash.  Allergic/Immunologic: Negative for immunocompromised state.  Psychiatric/Behavioral: Negative for behavioral problems and agitation.      Allergies  Cephalexin and Sulfamethoxazole-trimethoprim  Home Medications   Prior to Admission  medications   Medication Sig Start Date End Date Taking? Authorizing Provider  amLODipine (NORVASC) 10 MG tablet Take 1 tablet (10 mg total) by mouth daily. 07/05/13   Minus Breeding, MD  aspirin EC 81 MG EC tablet Take 1 tablet (81 mg total) by mouth daily. 02/18/15   Delfina Redwood, MD  atorvastatin (LIPITOR) 40 MG tablet Take 40 mg by mouth daily.    Historical Provider, MD  cinacalcet (SENSIPAR) 30 MG tablet Take 30 mg by mouth daily.    Historical Provider, MD  cloNIDine (CATAPRES) 0.3 MG tablet Take 1 tablet (0.3 mg total) by mouth 3 (three) times daily. 11/01/14   Debbe Odea, MD  clopidogrel (PLAVIX) 75 MG tablet Take 75 mg by mouth at bedtime.     Historical Provider, MD  docusate sodium (COLACE) 100 MG capsule Take 1 capsule (100 mg total) by mouth 2 (two) times daily. 02/18/15   Delfina Redwood, MD  gabapentin (NEURONTIN) 600 MG tablet Take 1 tablet (600 mg total) by mouth at bedtime. 02/18/15   Delfina Redwood, MD  HUMALOG MIX 50/50 KWIKPEN (50-50) 100 UNIT/ML Kwikpen Inject 26-28 Units into the skin 2 (two) times daily. 28 in the morning and 26 in the evening. 04/29/14   Historical Provider, MD  hydrALAZINE (APRESOLINE) 50 MG tablet Take 1 tablet (50 mg total) by mouth every 8 (eight) hours. 11/01/14   Debbe Odea, MD  isosorbide mononitrate (IMDUR) 60 MG 24 hr tablet Take 60 mg by mouth daily.    Historical Provider, MD  labetalol (NORMODYNE) 300 MG tablet Take 1 tablet (300 mg total) by mouth 3 (three) times daily. 11/01/14   Debbe Odea, MD  lamoTRIgine (LAMICTAL) 25 MG tablet Take 100 mg by mouth at bedtime.    Historical Provider, MD  methocarbamol (ROBAXIN) 500 MG tablet Take 1 tablet (500 mg total) by mouth every 6 (six) hours as needed for muscle spasms. 02/18/15   Delfina Redwood, MD  minoxidil (LONITEN) 10 MG tablet Take 10 mg by mouth 2 (two) times daily.    Historical Provider, MD  nitroGLYCERIN (NITROSTAT) 0.4 MG SL tablet Place 0.4 mg under the tongue every 5 (five)  minutes as needed. For chest pain    Historical Provider, MD  oxyCODONE (OXY IR/ROXICODONE) 5 MG immediate release tablet Take 1-2 tablets (5-10 mg total) by mouth every 4 (four) hours as needed for severe pain. 02/18/15   Delfina Redwood, MD  RENVELA 800 MG tablet Take 1 tablet by mouth 3 (three) times daily with meals.  04/17/13   Historical Provider, MD   BP 117/62 mmHg  Pulse 74  Temp(Src) 98.1 F (36.7 C) (Oral)  Resp 16  SpO2 99%  LMP 01/09/2015 Physical Exam  Constitutional: She is oriented to person, place,  and time. She appears well-developed and well-nourished.  HENT:  Head: Normocephalic and atraumatic.  Eyes: Conjunctivae are normal. Right eye exhibits no discharge.  Neck: Neck supple.  Cardiovascular: Normal rate, regular rhythm and normal heart sounds.   No murmur heard. Pulmonary/Chest: Effort normal and breath sounds normal. She has no wheezes. She has no rales.  Abdominal: Soft. She exhibits no distension.  Musculoskeletal: Normal range of motion. She exhibits no edema.  Right lower extremity cast. We going toes and good cap refill.  Neurological: She is oriented to person, place, and time. No cranial nerve deficit.  Skin: Skin is warm and dry. No rash noted. She is not diaphoretic.  Psychiatric: She has a normal mood and affect. Her behavior is normal.  Nursing note and vitals reviewed.   ED Course  Procedures (including critical care time) Labs Review Labs Reviewed  BASIC METABOLIC PANEL - Abnormal; Notable for the following:    Chloride 91 (*)    Glucose, Bld 143 (*)    BUN 31 (*)    Creatinine, Ser 7.98 (*)    GFR calc non Af Amer 6 (*)    GFR calc Af Amer 7 (*)    All other components within normal limits  CBC - Abnormal; Notable for the following:    RBC 3.21 (*)    Hemoglobin 9.6 (*)    HCT 31.7 (*)    RDW 15.7 (*)    All other components within normal limits    Imaging Review No results found. I have personally reviewed and evaluated these  images and lab results as part of my medical decision-making.   EKG Interpretation None      MDM   Final diagnoses:  None   patient is a 39 year old female presented with feelings of nausea and sleepiness. Patient has normal vital signs. Her normal physical exam. Patient's due for dialysis tomorrow. Her screening labs are normal. Not sure what is causing her nausea but we'll give her Phenergan. She takes phenegran at home occasionally. We will do PO challenge and have her go to dialysis tomorrow as planned. We'll have her follow-up with her primary care physician as needed.  Courteney Julio Alm, MD 02/21/15 2046

## 2015-02-21 NOTE — ED Notes (Signed)
Pt left with all belongings and was wheeled out of treatment area with significant other.

## 2015-02-21 NOTE — ED Notes (Addendum)
Patient broke right foot last Thursday. States yesterday both upper arms started shaking and then this afternoon she started stuttering. Stroke scale negative. gcs 15. Last dialysis yesterday.

## 2015-02-22 ENCOUNTER — Encounter (HOSPITAL_COMMUNITY): Payer: Self-pay | Admitting: Orthopedic Surgery

## 2015-02-22 DIAGNOSIS — D509 Iron deficiency anemia, unspecified: Secondary | ICD-10-CM | POA: Diagnosis not present

## 2015-02-22 DIAGNOSIS — D631 Anemia in chronic kidney disease: Secondary | ICD-10-CM | POA: Diagnosis not present

## 2015-02-22 DIAGNOSIS — N186 End stage renal disease: Secondary | ICD-10-CM | POA: Diagnosis not present

## 2015-02-22 DIAGNOSIS — E1129 Type 2 diabetes mellitus with other diabetic kidney complication: Secondary | ICD-10-CM | POA: Diagnosis not present

## 2015-02-22 DIAGNOSIS — N2581 Secondary hyperparathyroidism of renal origin: Secondary | ICD-10-CM | POA: Diagnosis not present

## 2015-02-23 ENCOUNTER — Encounter (HOSPITAL_COMMUNITY): Payer: Self-pay | Admitting: Orthopedic Surgery

## 2015-02-24 DIAGNOSIS — N186 End stage renal disease: Secondary | ICD-10-CM | POA: Diagnosis not present

## 2015-02-24 DIAGNOSIS — N2581 Secondary hyperparathyroidism of renal origin: Secondary | ICD-10-CM | POA: Diagnosis not present

## 2015-02-24 DIAGNOSIS — E1129 Type 2 diabetes mellitus with other diabetic kidney complication: Secondary | ICD-10-CM | POA: Diagnosis not present

## 2015-02-24 DIAGNOSIS — D509 Iron deficiency anemia, unspecified: Secondary | ICD-10-CM | POA: Diagnosis not present

## 2015-02-24 DIAGNOSIS — Z992 Dependence on renal dialysis: Secondary | ICD-10-CM | POA: Diagnosis not present

## 2015-02-24 DIAGNOSIS — D631 Anemia in chronic kidney disease: Secondary | ICD-10-CM | POA: Diagnosis not present

## 2015-02-27 DIAGNOSIS — Z23 Encounter for immunization: Secondary | ICD-10-CM | POA: Diagnosis not present

## 2015-02-27 DIAGNOSIS — S82851D Displaced trimalleolar fracture of right lower leg, subsequent encounter for closed fracture with routine healing: Secondary | ICD-10-CM | POA: Diagnosis not present

## 2015-02-27 DIAGNOSIS — D509 Iron deficiency anemia, unspecified: Secondary | ICD-10-CM | POA: Diagnosis not present

## 2015-02-27 DIAGNOSIS — S91301D Unspecified open wound, right foot, subsequent encounter: Secondary | ICD-10-CM | POA: Diagnosis not present

## 2015-02-27 DIAGNOSIS — D631 Anemia in chronic kidney disease: Secondary | ICD-10-CM | POA: Diagnosis not present

## 2015-02-27 DIAGNOSIS — N2581 Secondary hyperparathyroidism of renal origin: Secondary | ICD-10-CM | POA: Diagnosis not present

## 2015-02-27 DIAGNOSIS — N186 End stage renal disease: Secondary | ICD-10-CM | POA: Diagnosis not present

## 2015-03-01 DIAGNOSIS — S91301D Unspecified open wound, right foot, subsequent encounter: Secondary | ICD-10-CM | POA: Diagnosis not present

## 2015-03-01 DIAGNOSIS — D509 Iron deficiency anemia, unspecified: Secondary | ICD-10-CM | POA: Diagnosis not present

## 2015-03-01 DIAGNOSIS — N186 End stage renal disease: Secondary | ICD-10-CM | POA: Diagnosis not present

## 2015-03-01 DIAGNOSIS — N2581 Secondary hyperparathyroidism of renal origin: Secondary | ICD-10-CM | POA: Diagnosis not present

## 2015-03-01 DIAGNOSIS — Z23 Encounter for immunization: Secondary | ICD-10-CM | POA: Diagnosis not present

## 2015-03-02 NOTE — Op Note (Signed)
[name], [name] NO.:  [id number]  MEDICAL RECORD NO.:  [id number]  LOCATION:  5N14C                        FACILITY:  [facility]  PHYSICIAN:  Astrid Divine. Marcelino Scot, M.D. DATE OF BIRTH:  [birth year]  DATE OF PROCEDURE:  02/16/2015 DATE OF DISCHARGE:  02/18/2015                              OPERATIVE REPORT   PREOPERATIVE DIAGNOSIS:  Right ankle trimalleolar fracture, subluxation.  POSTOPERATIVE DIAGNOSIS:  Right ankle trimalleolar fracture, subluxation.  PROCEDURES: 1. Open reduction and internal fixation of right trimalleolar ankle     fracture. 2. Stress fluoroscopy of the syndesmosis.  INDICATIONS FOR PROCEDURE:  [name] is a 39 year old, end-stage renal disease patient who sustained a complex right ankle shear fracture involving the tibial plafond and both malleoli.  I did discuss with her the risks and benefits of surgical repair given her diabetes and soft tissue swelling and she underwent a period of hospitalization which incorporated dialysis treatments, glucose control to keep her sugars well under 200, and local soft tissue measures such as elevation and compression with splinting.  I had hoped to pursue nonsurgical management, but the medial subluxation of the talus and trimalleolar instability pattern.  After discussion of these risks and others, she strongly wished to proceed.  BRIEF SUMMARY OF PROCEDURE:  [name] was given preoperative antibiotics, taken to the operating room and general anesthesia was induced.  Her right lower extremity was prepped and draped in usual sterile fashion.  No tourniquet was used during the procedure.  I began with a medial approach where a longitudinal incision was made over the anteromedial tibia.  I was able to use a contoured plate from [employer] to buttress against this vertical shear fracture of the medial malleolus and extend some fixation out on to the plafond.  This was then followed by a lateral  incision where I used a hook plate again by the [place] to grasp the distal avulsed fragments and pulled them back laterally and proximally and secured them into position with several holes in the lateral malleolar plate.  This having been accomplished, I performed stress evaluation and did not identify any widening or diastasis at the syndesmosis.  Consequently, no further fixation was placed.  Ainsley Spinner, PA-C, did assist me throughout.  Wounds were irrigated thoroughly and closed in standard layered fashion with meticulous attention to the soft tissues using PDS and 3-0 nylon.  Very well-padded splint and dressing were applied and then the patient was taken to the PACU in stable condition.  PROGNOSIS:  [name] will be nonweightbearing on the right lower extremity with plans to follow up in 10 to 14 days for removal of sutures.  At this time, her skin appears to be in excellent condition, but again her tissues are quite delicate.     Astrid Divine. Marcelino Scot, M.D.     MHH/MEDQ  D:  03/01/2015  T:  03/02/2015  Job:  [id number]

## 2015-03-03 DIAGNOSIS — S91301D Unspecified open wound, right foot, subsequent encounter: Secondary | ICD-10-CM | POA: Diagnosis not present

## 2015-03-03 DIAGNOSIS — N2581 Secondary hyperparathyroidism of renal origin: Secondary | ICD-10-CM | POA: Diagnosis not present

## 2015-03-03 DIAGNOSIS — Z23 Encounter for immunization: Secondary | ICD-10-CM | POA: Diagnosis not present

## 2015-03-03 DIAGNOSIS — D509 Iron deficiency anemia, unspecified: Secondary | ICD-10-CM | POA: Diagnosis not present

## 2015-03-03 DIAGNOSIS — N186 End stage renal disease: Secondary | ICD-10-CM | POA: Diagnosis not present

## 2015-03-04 ENCOUNTER — Ambulatory Visit (INDEPENDENT_AMBULATORY_CARE_PROVIDER_SITE_OTHER): Payer: Medicare Other | Admitting: Physician Assistant

## 2015-03-04 VITALS — BP 138/60 | HR 85 | Temp 98.0°F | Resp 16 | Ht 76.0 in | Wt 240.0 lb

## 2015-03-04 DIAGNOSIS — Z23 Encounter for immunization: Secondary | ICD-10-CM | POA: Diagnosis not present

## 2015-03-04 DIAGNOSIS — S61422A Laceration with foreign body of left hand, initial encounter: Secondary | ICD-10-CM

## 2015-03-04 DIAGNOSIS — IMO0002 Reserved for concepts with insufficient information to code with codable children: Secondary | ICD-10-CM

## 2015-03-04 DIAGNOSIS — I2510 Atherosclerotic heart disease of native coronary artery without angina pectoris: Secondary | ICD-10-CM | POA: Diagnosis not present

## 2015-03-04 NOTE — Patient Instructions (Signed)
WOUND CARE Please return in 7-10 days to have your stitches/staples removed or sooner if you have concerns. . Keep area clean and dry with dressing in place for 24 hours. . After 24 hours, remove bandage and wash wound gently with mild soap and warm water. When skin is dry, reapply a new bandage. Once wound is no longer draining, may leave wound open to air. . Continue daily cleansing with soap and water until stitches/staples are removed. . Do not apply any ointments or creams to the wound while stitches/staples are in place, as this may cause delayed healing. . Notify the office if you experience any of the following signs of infection: Swelling, redness, pus drainage, streaking, fever >101.0 F . Notify the office if you experience excessive bleeding that does not stop after 15-20 minutes of constant, firm pressure.   

## 2015-03-04 NOTE — Progress Notes (Signed)
[facility] [address] [place] [address] [phone]  Date:  03/04/2015   Name:  [name]   DOB:  [birth year]   MRN:  [id number]  PCP:  Merrilee Seashore, MD    Chief Complaint: Hand Injury   History of Present Illness:  This is a 39 y.o. female with PMH IDDM, HTN, HLD, ESRD on dialysis, PVD, CAD who is presenting with left hand laceration that occurred earlier this morning when she was cutting bacon. She applied pressure with a dressing which controlled the bleeding. She denies diff with finger/hand movement or paresthesias. She cannot recall her last tdap, she thinks it has been >10 years.  Review of Systems:  Review of Systems See HPI  Patient Active Problem List   Diagnosis Date Noted  . DM (diabetes mellitus), type 2 with renal complications ([place]) 47/65/4650  . Pain   . Bimalleolar ankle fracture 02/12/2015  . CAD (coronary artery disease) 02/09/2015  . Peripheral vascular disease ([address])   . Gastroparesis   . Chronic anemia   . Menorrhagia with regular cycle 01/04/2014  . Dysmenorrhea 01/04/2014  . Nausea & vomiting 10/16/2011  . History of noncompliance with medical treatment 05/09/2011  . Chronic UTI 05/09/2011  . CIN III (cervical intraepithelial neoplasia grade III) with severe dysplasia   . History of abscesses in groins 12/06/2010  . End stage renal disease on dialysis ([place])   . Coronary artery disease   . Hyperlipidemia   . 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 NONDEPENDENT TOBACCO USE DISORDER 12/20/2009  . Essential hypertension, benign 12/20/2009  . NEPHROTIC SYNDROME 12/20/2009  . Shortness of breath 12/20/2009  . Chest pain 12/20/2009    Prior to Admission medications   Medication Sig Start Date End Date Taking? Authorizing Provider  acetaminophen (TYLENOL) 500 MG tablet Take 500 mg by mouth every 6 (six) hours as needed for mild pain or moderate pain.   Yes Historical Provider, MD  amLODipine (NORVASC) 10 MG tablet Take 1  tablet (10 mg total) by mouth daily. Patient taking differently: Take 10 mg by mouth at bedtime.  07/05/13  Yes Minus Breeding, MD  atorvastatin (LIPITOR) 40 MG tablet Take 40 mg by mouth daily.   Yes Historical Provider, MD  cinacalcet (SENSIPAR) 30 MG tablet Take 30 mg by mouth daily.   Yes Historical Provider, MD  cloNIDine (CATAPRES) 0.3 MG tablet Take 1 tablet (0.3 mg total) by mouth 3 (three) times daily. 11/01/14  Yes Debbe Odea, MD  clopidogrel (PLAVIX) 75 MG tablet Take 75 mg by mouth at bedtime.    Yes Historical Provider, MD  gabapentin (NEURONTIN) 600 MG tablet Take 1 tablet (600 mg total) by mouth at bedtime. 02/18/15  Yes Delfina Redwood, MD  HUMALOG MIX 50/50 KWIKPEN (50-50) 100 UNIT/ML Kwikpen Inject 26-28 Units into the skin 2 (two) times daily. 28 in the morning and 26 in the evening. 04/29/14  Yes Historical Provider, MD  hydrALAZINE (APRESOLINE) 50 MG tablet Take 1 tablet (50 mg total) by mouth every 8 (eight) hours. 11/01/14  Yes Debbe Odea, MD  isosorbide mononitrate (IMDUR) 60 MG 24 hr tablet Take 60 mg by mouth daily.   Yes Historical Provider, MD  labetalol (NORMODYNE) 300 MG tablet Take 1 tablet (300 mg total) by mouth 3 (three) times daily. 11/01/14  Yes Debbe Odea, MD  lamoTRIgine (LAMICTAL) 25 MG tablet Take 50 mg by mouth 2 (two) times daily.    Yes Historical Provider, MD  minoxidil (LONITEN)  10 MG tablet Take 10 mg by mouth 2 (two) times daily.   Yes Historical Provider, MD  RENVELA 800 MG tablet Take 1 tablet by mouth 3 (three) times daily with meals.  04/17/13  Yes Historical Provider, MD  aspirin EC 81 MG EC tablet Take 1 tablet (81 mg total) by mouth daily. Patient not taking: Reported on 03/04/2015 02/18/15   Delfina Redwood, MD    Allergies  Allergen Reactions  . Cephalexin Other (See Comments)    Reaction unknown  . Sulfamethoxazole-Trimethoprim Other (See Comments)    Thrush    Past Surgical History  Procedure Laterality Date  . Av fistula placement   04/2010  . Dilation and curettage of uterus  2009  . Cervical biopsy  w/ loop electrode excision      h/o  . Cervical cone biopsy      h/o  . Colposcopy    . Cardiac catheterization [name] 02/10/2015    Procedure: Left Heart Cath and Coronary Angiography;  Surgeon: Dixie Dials, MD;  Location: [facility];  Service: Cardiovascular;  Laterality: [name];  . Orif ankle fracture Right 02/09/2015  . Orif ankle fracture Right 02/16/2015    Procedure: OPEN REDUCTION INTERNAL FIXATION (ORIF) RIGHT ANKLE FRACTURE;  Surgeon: Altamese [place], MD;  Location: [facility];  Service: Orthopedics;  Laterality: Right;    Social History  Substance Use Topics  . Smoking status: Former Smoker -- 0.30 packs/day for 10 years    Types: Cigarettes    Quit date: 08/05/2011  . Smokeless tobacco: Never Used     Comment: "quit smoking cigarettes 07/2010"  . Alcohol Use: No    Family History  Problem Relation Age of Onset  . Diabetes Mother   . Hypertension Mother   . Diabetes Father   . Hyperlipidemia Father   . Hypertension Father   . Diabetes Sister   . Breast cancer Maternal Aunt     Age 22's  . Stroke Maternal Grandmother   . Diabetes Sister     Medication list has been reviewed and updated.  Physical Examination:  Physical Exam  Constitutional: She is oriented to person, place, and time. She appears well-developed and well-nourished. No distress.  HENT:  Head: Normocephalic and atraumatic.  Right Ear: Hearing normal.  Left Ear: Hearing normal.  Nose: Nose normal.  Eyes: Conjunctivae and lids are normal. Right eye exhibits no discharge. Left eye exhibits no discharge. No scleral icterus.  Pulmonary/Chest: Effort normal. No respiratory distress.  Musculoskeletal: Normal range of motion.  Neurological: She is alert and oriented to person, place, and time. She has normal strength. No sensory deficit.  Skin: Skin is warm and dry.  2 cm laceration on dorsal left hand between thumb and index finger.   Psychiatric: She has a normal mood and affect. Her speech is normal and behavior is normal. Thought content normal.   BP 138/60 mmHg  Pulse 85  Temp(Src) 98 F (36.7 C) (Oral)  Resp 16  Ht 6\' 4"  (1.93 m)  Wt 240 lb (108.863 kg)  BMI 29.23 kg/m2  SpO2 96%  LMP 01/25/2015  Procedure: Verbal consent obtained. Skin was anesthetized with 2 cc 1% lido without epi and cleaned with soap and water. A 2 cm laceration located left hand was sutured with #3 simple 5.0 ethilon sutures. Wound was dressed and wound care discussed.  Assessment and Plan:  1. Laceration Repaired. Wound care discussed. Return in 7-10 days for suture removal.  2. Need for Tdap vaccination -  Tdap vaccine greater than or equal to 7yo IM   Benjaman Pott. Drenda Freeze, MHS [facility]  03/04/2015

## 2015-03-06 DIAGNOSIS — N186 End stage renal disease: Secondary | ICD-10-CM | POA: Diagnosis not present

## 2015-03-06 DIAGNOSIS — S91301D Unspecified open wound, right foot, subsequent encounter: Secondary | ICD-10-CM | POA: Diagnosis not present

## 2015-03-06 DIAGNOSIS — N2581 Secondary hyperparathyroidism of renal origin: Secondary | ICD-10-CM | POA: Diagnosis not present

## 2015-03-06 DIAGNOSIS — Z23 Encounter for immunization: Secondary | ICD-10-CM | POA: Diagnosis not present

## 2015-03-06 DIAGNOSIS — D509 Iron deficiency anemia, unspecified: Secondary | ICD-10-CM | POA: Diagnosis not present

## 2015-03-08 DIAGNOSIS — N2581 Secondary hyperparathyroidism of renal origin: Secondary | ICD-10-CM | POA: Diagnosis not present

## 2015-03-08 DIAGNOSIS — N186 End stage renal disease: Secondary | ICD-10-CM | POA: Diagnosis not present

## 2015-03-08 DIAGNOSIS — S91301D Unspecified open wound, right foot, subsequent encounter: Secondary | ICD-10-CM | POA: Diagnosis not present

## 2015-03-08 DIAGNOSIS — D509 Iron deficiency anemia, unspecified: Secondary | ICD-10-CM | POA: Diagnosis not present

## 2015-03-08 DIAGNOSIS — Z23 Encounter for immunization: Secondary | ICD-10-CM | POA: Diagnosis not present

## 2015-03-10 DIAGNOSIS — S91301D Unspecified open wound, right foot, subsequent encounter: Secondary | ICD-10-CM | POA: Diagnosis not present

## 2015-03-10 DIAGNOSIS — Z23 Encounter for immunization: Secondary | ICD-10-CM | POA: Diagnosis not present

## 2015-03-10 DIAGNOSIS — N186 End stage renal disease: Secondary | ICD-10-CM | POA: Diagnosis not present

## 2015-03-10 DIAGNOSIS — N2581 Secondary hyperparathyroidism of renal origin: Secondary | ICD-10-CM | POA: Diagnosis not present

## 2015-03-10 DIAGNOSIS — D509 Iron deficiency anemia, unspecified: Secondary | ICD-10-CM | POA: Diagnosis not present

## 2015-03-12 ENCOUNTER — Ambulatory Visit (INDEPENDENT_AMBULATORY_CARE_PROVIDER_SITE_OTHER): Payer: Medicare Other | Admitting: Family Medicine

## 2015-03-12 VITALS — BP 102/58 | HR 78 | Temp 97.5°F | Resp 18 | Ht 76.0 in | Wt 240.0 lb

## 2015-03-12 DIAGNOSIS — Z4802 Encounter for removal of sutures: Secondary | ICD-10-CM

## 2015-03-12 NOTE — Patient Instructions (Signed)

## 2015-03-13 DIAGNOSIS — S91301D Unspecified open wound, right foot, subsequent encounter: Secondary | ICD-10-CM | POA: Diagnosis not present

## 2015-03-13 DIAGNOSIS — N186 End stage renal disease: Secondary | ICD-10-CM | POA: Diagnosis not present

## 2015-03-13 DIAGNOSIS — N2581 Secondary hyperparathyroidism of renal origin: Secondary | ICD-10-CM | POA: Diagnosis not present

## 2015-03-13 DIAGNOSIS — D509 Iron deficiency anemia, unspecified: Secondary | ICD-10-CM | POA: Diagnosis not present

## 2015-03-13 DIAGNOSIS — Z23 Encounter for immunization: Secondary | ICD-10-CM | POA: Diagnosis not present

## 2015-03-13 NOTE — Progress Notes (Signed)
Wound well healed, 3 sutures removed by Morey Hummingbird w/o comp. Advised wet warm compress followed by top vit E, mederma, or mupirocin to help costmetic appearance. RTC immed for any increased pain, redness, swelling, or purulent drainage.

## 2015-03-15 DIAGNOSIS — N2581 Secondary hyperparathyroidism of renal origin: Secondary | ICD-10-CM | POA: Diagnosis not present

## 2015-03-15 DIAGNOSIS — D509 Iron deficiency anemia, unspecified: Secondary | ICD-10-CM | POA: Diagnosis not present

## 2015-03-15 DIAGNOSIS — S91301D Unspecified open wound, right foot, subsequent encounter: Secondary | ICD-10-CM | POA: Diagnosis not present

## 2015-03-15 DIAGNOSIS — N186 End stage renal disease: Secondary | ICD-10-CM | POA: Diagnosis not present

## 2015-03-15 DIAGNOSIS — E1129 Type 2 diabetes mellitus with other diabetic kidney complication: Secondary | ICD-10-CM | POA: Diagnosis not present

## 2015-03-15 DIAGNOSIS — Z23 Encounter for immunization: Secondary | ICD-10-CM | POA: Diagnosis not present

## 2015-03-17 DIAGNOSIS — D509 Iron deficiency anemia, unspecified: Secondary | ICD-10-CM | POA: Diagnosis not present

## 2015-03-17 DIAGNOSIS — N186 End stage renal disease: Secondary | ICD-10-CM | POA: Diagnosis not present

## 2015-03-17 DIAGNOSIS — S91301D Unspecified open wound, right foot, subsequent encounter: Secondary | ICD-10-CM | POA: Diagnosis not present

## 2015-03-17 DIAGNOSIS — Z23 Encounter for immunization: Secondary | ICD-10-CM | POA: Diagnosis not present

## 2015-03-17 DIAGNOSIS — N2581 Secondary hyperparathyroidism of renal origin: Secondary | ICD-10-CM | POA: Diagnosis not present

## 2015-03-20 DIAGNOSIS — N186 End stage renal disease: Secondary | ICD-10-CM | POA: Diagnosis not present

## 2015-03-20 DIAGNOSIS — S91301D Unspecified open wound, right foot, subsequent encounter: Secondary | ICD-10-CM | POA: Diagnosis not present

## 2015-03-20 DIAGNOSIS — Z23 Encounter for immunization: Secondary | ICD-10-CM | POA: Diagnosis not present

## 2015-03-20 DIAGNOSIS — D509 Iron deficiency anemia, unspecified: Secondary | ICD-10-CM | POA: Diagnosis not present

## 2015-03-20 DIAGNOSIS — N2581 Secondary hyperparathyroidism of renal origin: Secondary | ICD-10-CM | POA: Diagnosis not present

## 2015-03-22 DIAGNOSIS — N186 End stage renal disease: Secondary | ICD-10-CM | POA: Diagnosis not present

## 2015-03-22 DIAGNOSIS — S91301D Unspecified open wound, right foot, subsequent encounter: Secondary | ICD-10-CM | POA: Diagnosis not present

## 2015-03-22 DIAGNOSIS — N2581 Secondary hyperparathyroidism of renal origin: Secondary | ICD-10-CM | POA: Diagnosis not present

## 2015-03-22 DIAGNOSIS — Z23 Encounter for immunization: Secondary | ICD-10-CM | POA: Diagnosis not present

## 2015-03-22 DIAGNOSIS — D509 Iron deficiency anemia, unspecified: Secondary | ICD-10-CM | POA: Diagnosis not present

## 2015-03-24 DIAGNOSIS — N186 End stage renal disease: Secondary | ICD-10-CM | POA: Diagnosis not present

## 2015-03-24 DIAGNOSIS — N2581 Secondary hyperparathyroidism of renal origin: Secondary | ICD-10-CM | POA: Diagnosis not present

## 2015-03-24 DIAGNOSIS — S91301D Unspecified open wound, right foot, subsequent encounter: Secondary | ICD-10-CM | POA: Diagnosis not present

## 2015-03-24 DIAGNOSIS — D509 Iron deficiency anemia, unspecified: Secondary | ICD-10-CM | POA: Diagnosis not present

## 2015-03-24 DIAGNOSIS — Z23 Encounter for immunization: Secondary | ICD-10-CM | POA: Diagnosis not present

## 2015-03-27 DIAGNOSIS — S91301D Unspecified open wound, right foot, subsequent encounter: Secondary | ICD-10-CM | POA: Diagnosis not present

## 2015-03-27 DIAGNOSIS — Z23 Encounter for immunization: Secondary | ICD-10-CM | POA: Diagnosis not present

## 2015-03-27 DIAGNOSIS — E1129 Type 2 diabetes mellitus with other diabetic kidney complication: Secondary | ICD-10-CM | POA: Diagnosis not present

## 2015-03-27 DIAGNOSIS — N186 End stage renal disease: Secondary | ICD-10-CM | POA: Diagnosis not present

## 2015-03-27 DIAGNOSIS — D509 Iron deficiency anemia, unspecified: Secondary | ICD-10-CM | POA: Diagnosis not present

## 2015-03-27 DIAGNOSIS — Z992 Dependence on renal dialysis: Secondary | ICD-10-CM | POA: Diagnosis not present

## 2015-03-27 DIAGNOSIS — N2581 Secondary hyperparathyroidism of renal origin: Secondary | ICD-10-CM | POA: Diagnosis not present

## 2015-03-29 DIAGNOSIS — N2581 Secondary hyperparathyroidism of renal origin: Secondary | ICD-10-CM | POA: Diagnosis not present

## 2015-03-29 DIAGNOSIS — E1129 Type 2 diabetes mellitus with other diabetic kidney complication: Secondary | ICD-10-CM | POA: Diagnosis not present

## 2015-03-29 DIAGNOSIS — D509 Iron deficiency anemia, unspecified: Secondary | ICD-10-CM | POA: Diagnosis not present

## 2015-03-29 DIAGNOSIS — D631 Anemia in chronic kidney disease: Secondary | ICD-10-CM | POA: Diagnosis not present

## 2015-03-29 DIAGNOSIS — N186 End stage renal disease: Secondary | ICD-10-CM | POA: Diagnosis not present

## 2015-03-30 ENCOUNTER — Ambulatory Visit
Admission: RE | Admit: 2015-03-30 | Discharge: 2015-03-30 | Disposition: A | Discharge status: Home / Self Care | Payer: Medicare Other | Source: Ambulatory Visit | Attending: Orthopedic Surgery | Admitting: Orthopedic Surgery

## 2015-03-30 ENCOUNTER — Other Ambulatory Visit: Payer: Self-pay | Admitting: Orthopedic Surgery

## 2015-03-30 DIAGNOSIS — T148XXA Other injury of unspecified body region, initial encounter: Secondary | ICD-10-CM

## 2015-03-30 DIAGNOSIS — S8251XD Displaced fracture of medial malleolus of right tibia, subsequent encounter for closed fracture with routine healing: Secondary | ICD-10-CM | POA: Diagnosis not present

## 2015-03-30 DIAGNOSIS — S8261XD Displaced fracture of lateral malleolus of right fibula, subsequent encounter for closed fracture with routine healing: Secondary | ICD-10-CM | POA: Diagnosis not present

## 2015-03-31 DIAGNOSIS — D509 Iron deficiency anemia, unspecified: Secondary | ICD-10-CM | POA: Diagnosis not present

## 2015-03-31 DIAGNOSIS — N186 End stage renal disease: Secondary | ICD-10-CM | POA: Diagnosis not present

## 2015-03-31 DIAGNOSIS — E1129 Type 2 diabetes mellitus with other diabetic kidney complication: Secondary | ICD-10-CM | POA: Diagnosis not present

## 2015-03-31 DIAGNOSIS — N2581 Secondary hyperparathyroidism of renal origin: Secondary | ICD-10-CM | POA: Diagnosis not present

## 2015-03-31 DIAGNOSIS — D631 Anemia in chronic kidney disease: Secondary | ICD-10-CM | POA: Diagnosis not present

## 2015-04-03 DIAGNOSIS — N2581 Secondary hyperparathyroidism of renal origin: Secondary | ICD-10-CM | POA: Diagnosis not present

## 2015-04-03 DIAGNOSIS — D631 Anemia in chronic kidney disease: Secondary | ICD-10-CM | POA: Diagnosis not present

## 2015-04-03 DIAGNOSIS — E1129 Type 2 diabetes mellitus with other diabetic kidney complication: Secondary | ICD-10-CM | POA: Diagnosis not present

## 2015-04-03 DIAGNOSIS — N186 End stage renal disease: Secondary | ICD-10-CM | POA: Diagnosis not present

## 2015-04-03 DIAGNOSIS — D509 Iron deficiency anemia, unspecified: Secondary | ICD-10-CM | POA: Diagnosis not present

## 2015-04-04 DIAGNOSIS — G44219 Episodic tension-type headache, not intractable: Secondary | ICD-10-CM | POA: Diagnosis not present

## 2015-04-04 DIAGNOSIS — G4489 Other headache syndrome: Secondary | ICD-10-CM | POA: Diagnosis not present

## 2015-04-05 DIAGNOSIS — N2581 Secondary hyperparathyroidism of renal origin: Secondary | ICD-10-CM | POA: Diagnosis not present

## 2015-04-05 DIAGNOSIS — N186 End stage renal disease: Secondary | ICD-10-CM | POA: Diagnosis not present

## 2015-04-05 DIAGNOSIS — E1129 Type 2 diabetes mellitus with other diabetic kidney complication: Secondary | ICD-10-CM | POA: Diagnosis not present

## 2015-04-05 DIAGNOSIS — D509 Iron deficiency anemia, unspecified: Secondary | ICD-10-CM | POA: Diagnosis not present

## 2015-04-05 DIAGNOSIS — D631 Anemia in chronic kidney disease: Secondary | ICD-10-CM | POA: Diagnosis not present

## 2015-04-07 DIAGNOSIS — N186 End stage renal disease: Secondary | ICD-10-CM | POA: Diagnosis not present

## 2015-04-07 DIAGNOSIS — N2581 Secondary hyperparathyroidism of renal origin: Secondary | ICD-10-CM | POA: Diagnosis not present

## 2015-04-07 DIAGNOSIS — D509 Iron deficiency anemia, unspecified: Secondary | ICD-10-CM | POA: Diagnosis not present

## 2015-04-07 DIAGNOSIS — D631 Anemia in chronic kidney disease: Secondary | ICD-10-CM | POA: Diagnosis not present

## 2015-04-07 DIAGNOSIS — E1129 Type 2 diabetes mellitus with other diabetic kidney complication: Secondary | ICD-10-CM | POA: Diagnosis not present

## 2015-04-10 DIAGNOSIS — D631 Anemia in chronic kidney disease: Secondary | ICD-10-CM | POA: Diagnosis not present

## 2015-04-10 DIAGNOSIS — N186 End stage renal disease: Secondary | ICD-10-CM | POA: Diagnosis not present

## 2015-04-10 DIAGNOSIS — D509 Iron deficiency anemia, unspecified: Secondary | ICD-10-CM | POA: Diagnosis not present

## 2015-04-10 DIAGNOSIS — N2581 Secondary hyperparathyroidism of renal origin: Secondary | ICD-10-CM | POA: Diagnosis not present

## 2015-04-10 DIAGNOSIS — E1129 Type 2 diabetes mellitus with other diabetic kidney complication: Secondary | ICD-10-CM | POA: Diagnosis not present

## 2015-04-12 DIAGNOSIS — N2581 Secondary hyperparathyroidism of renal origin: Secondary | ICD-10-CM | POA: Diagnosis not present

## 2015-04-12 DIAGNOSIS — N186 End stage renal disease: Secondary | ICD-10-CM | POA: Diagnosis not present

## 2015-04-12 DIAGNOSIS — E1129 Type 2 diabetes mellitus with other diabetic kidney complication: Secondary | ICD-10-CM | POA: Diagnosis not present

## 2015-04-12 DIAGNOSIS — D509 Iron deficiency anemia, unspecified: Secondary | ICD-10-CM | POA: Diagnosis not present

## 2015-04-12 DIAGNOSIS — D631 Anemia in chronic kidney disease: Secondary | ICD-10-CM | POA: Diagnosis not present

## 2015-04-14 DIAGNOSIS — D631 Anemia in chronic kidney disease: Secondary | ICD-10-CM | POA: Diagnosis not present

## 2015-04-14 DIAGNOSIS — E1129 Type 2 diabetes mellitus with other diabetic kidney complication: Secondary | ICD-10-CM | POA: Diagnosis not present

## 2015-04-14 DIAGNOSIS — N2581 Secondary hyperparathyroidism of renal origin: Secondary | ICD-10-CM | POA: Diagnosis not present

## 2015-04-14 DIAGNOSIS — D509 Iron deficiency anemia, unspecified: Secondary | ICD-10-CM | POA: Diagnosis not present

## 2015-04-14 DIAGNOSIS — N186 End stage renal disease: Secondary | ICD-10-CM | POA: Diagnosis not present

## 2015-04-17 DIAGNOSIS — D509 Iron deficiency anemia, unspecified: Secondary | ICD-10-CM | POA: Diagnosis not present

## 2015-04-17 DIAGNOSIS — D631 Anemia in chronic kidney disease: Secondary | ICD-10-CM | POA: Diagnosis not present

## 2015-04-17 DIAGNOSIS — N2581 Secondary hyperparathyroidism of renal origin: Secondary | ICD-10-CM | POA: Diagnosis not present

## 2015-04-17 DIAGNOSIS — S82851D Displaced trimalleolar fracture of right lower leg, subsequent encounter for closed fracture with routine healing: Secondary | ICD-10-CM | POA: Diagnosis not present

## 2015-04-17 DIAGNOSIS — E1129 Type 2 diabetes mellitus with other diabetic kidney complication: Secondary | ICD-10-CM | POA: Diagnosis not present

## 2015-04-17 DIAGNOSIS — N186 End stage renal disease: Secondary | ICD-10-CM | POA: Diagnosis not present

## 2015-04-19 DIAGNOSIS — D631 Anemia in chronic kidney disease: Secondary | ICD-10-CM | POA: Diagnosis not present

## 2015-04-19 DIAGNOSIS — E1129 Type 2 diabetes mellitus with other diabetic kidney complication: Secondary | ICD-10-CM | POA: Diagnosis not present

## 2015-04-19 DIAGNOSIS — D509 Iron deficiency anemia, unspecified: Secondary | ICD-10-CM | POA: Diagnosis not present

## 2015-04-19 DIAGNOSIS — N186 End stage renal disease: Secondary | ICD-10-CM | POA: Diagnosis not present

## 2015-04-19 DIAGNOSIS — N2581 Secondary hyperparathyroidism of renal origin: Secondary | ICD-10-CM | POA: Diagnosis not present

## 2015-04-22 DIAGNOSIS — N186 End stage renal disease: Secondary | ICD-10-CM | POA: Diagnosis not present

## 2015-04-22 DIAGNOSIS — E1129 Type 2 diabetes mellitus with other diabetic kidney complication: Secondary | ICD-10-CM | POA: Diagnosis not present

## 2015-04-22 DIAGNOSIS — D509 Iron deficiency anemia, unspecified: Secondary | ICD-10-CM | POA: Diagnosis not present

## 2015-04-22 DIAGNOSIS — D631 Anemia in chronic kidney disease: Secondary | ICD-10-CM | POA: Diagnosis not present

## 2015-04-22 DIAGNOSIS — N2581 Secondary hyperparathyroidism of renal origin: Secondary | ICD-10-CM | POA: Diagnosis not present

## 2015-04-24 DIAGNOSIS — D509 Iron deficiency anemia, unspecified: Secondary | ICD-10-CM | POA: Diagnosis not present

## 2015-04-24 DIAGNOSIS — E1129 Type 2 diabetes mellitus with other diabetic kidney complication: Secondary | ICD-10-CM | POA: Diagnosis not present

## 2015-04-24 DIAGNOSIS — D631 Anemia in chronic kidney disease: Secondary | ICD-10-CM | POA: Diagnosis not present

## 2015-04-24 DIAGNOSIS — N186 End stage renal disease: Secondary | ICD-10-CM | POA: Diagnosis not present

## 2015-04-24 DIAGNOSIS — N2581 Secondary hyperparathyroidism of renal origin: Secondary | ICD-10-CM | POA: Diagnosis not present

## 2015-04-26 DIAGNOSIS — D509 Iron deficiency anemia, unspecified: Secondary | ICD-10-CM | POA: Diagnosis not present

## 2015-04-26 DIAGNOSIS — N2581 Secondary hyperparathyroidism of renal origin: Secondary | ICD-10-CM | POA: Diagnosis not present

## 2015-04-26 DIAGNOSIS — N186 End stage renal disease: Secondary | ICD-10-CM | POA: Diagnosis not present

## 2015-04-26 DIAGNOSIS — D631 Anemia in chronic kidney disease: Secondary | ICD-10-CM | POA: Diagnosis not present

## 2015-04-26 DIAGNOSIS — E1129 Type 2 diabetes mellitus with other diabetic kidney complication: Secondary | ICD-10-CM | POA: Diagnosis not present

## 2015-04-26 DIAGNOSIS — Z992 Dependence on renal dialysis: Secondary | ICD-10-CM | POA: Diagnosis not present

## 2015-04-28 DIAGNOSIS — N2581 Secondary hyperparathyroidism of renal origin: Secondary | ICD-10-CM | POA: Diagnosis not present

## 2015-04-28 DIAGNOSIS — E1129 Type 2 diabetes mellitus with other diabetic kidney complication: Secondary | ICD-10-CM | POA: Diagnosis not present

## 2015-04-28 DIAGNOSIS — N186 End stage renal disease: Secondary | ICD-10-CM | POA: Diagnosis not present

## 2015-04-28 DIAGNOSIS — D631 Anemia in chronic kidney disease: Secondary | ICD-10-CM | POA: Diagnosis not present

## 2015-04-28 DIAGNOSIS — D509 Iron deficiency anemia, unspecified: Secondary | ICD-10-CM | POA: Diagnosis not present

## 2015-04-28 DIAGNOSIS — D508 Other iron deficiency anemias: Secondary | ICD-10-CM | POA: Diagnosis not present

## 2015-05-01 DIAGNOSIS — N2581 Secondary hyperparathyroidism of renal origin: Secondary | ICD-10-CM | POA: Diagnosis not present

## 2015-05-01 DIAGNOSIS — D509 Iron deficiency anemia, unspecified: Secondary | ICD-10-CM | POA: Diagnosis not present

## 2015-05-01 DIAGNOSIS — D508 Other iron deficiency anemias: Secondary | ICD-10-CM | POA: Diagnosis not present

## 2015-05-01 DIAGNOSIS — E1129 Type 2 diabetes mellitus with other diabetic kidney complication: Secondary | ICD-10-CM | POA: Diagnosis not present

## 2015-05-01 DIAGNOSIS — N186 End stage renal disease: Secondary | ICD-10-CM | POA: Diagnosis not present

## 2015-05-03 DIAGNOSIS — D508 Other iron deficiency anemias: Secondary | ICD-10-CM | POA: Diagnosis not present

## 2015-05-03 DIAGNOSIS — E1129 Type 2 diabetes mellitus with other diabetic kidney complication: Secondary | ICD-10-CM | POA: Diagnosis not present

## 2015-05-03 DIAGNOSIS — N186 End stage renal disease: Secondary | ICD-10-CM | POA: Diagnosis not present

## 2015-05-03 DIAGNOSIS — N2581 Secondary hyperparathyroidism of renal origin: Secondary | ICD-10-CM | POA: Diagnosis not present

## 2015-05-03 DIAGNOSIS — D509 Iron deficiency anemia, unspecified: Secondary | ICD-10-CM | POA: Diagnosis not present

## 2015-05-05 DIAGNOSIS — D508 Other iron deficiency anemias: Secondary | ICD-10-CM | POA: Diagnosis not present

## 2015-05-05 DIAGNOSIS — D509 Iron deficiency anemia, unspecified: Secondary | ICD-10-CM | POA: Diagnosis not present

## 2015-05-05 DIAGNOSIS — E1129 Type 2 diabetes mellitus with other diabetic kidney complication: Secondary | ICD-10-CM | POA: Diagnosis not present

## 2015-05-05 DIAGNOSIS — N186 End stage renal disease: Secondary | ICD-10-CM | POA: Diagnosis not present

## 2015-05-05 DIAGNOSIS — N2581 Secondary hyperparathyroidism of renal origin: Secondary | ICD-10-CM | POA: Diagnosis not present

## 2015-05-08 DIAGNOSIS — D508 Other iron deficiency anemias: Secondary | ICD-10-CM | POA: Diagnosis not present

## 2015-05-08 DIAGNOSIS — N186 End stage renal disease: Secondary | ICD-10-CM | POA: Diagnosis not present

## 2015-05-08 DIAGNOSIS — N2581 Secondary hyperparathyroidism of renal origin: Secondary | ICD-10-CM | POA: Diagnosis not present

## 2015-05-08 DIAGNOSIS — D509 Iron deficiency anemia, unspecified: Secondary | ICD-10-CM | POA: Diagnosis not present

## 2015-05-08 DIAGNOSIS — E1129 Type 2 diabetes mellitus with other diabetic kidney complication: Secondary | ICD-10-CM | POA: Diagnosis not present

## 2015-05-10 DIAGNOSIS — N186 End stage renal disease: Secondary | ICD-10-CM | POA: Diagnosis not present

## 2015-05-10 DIAGNOSIS — D509 Iron deficiency anemia, unspecified: Secondary | ICD-10-CM | POA: Diagnosis not present

## 2015-05-10 DIAGNOSIS — E1129 Type 2 diabetes mellitus with other diabetic kidney complication: Secondary | ICD-10-CM | POA: Diagnosis not present

## 2015-05-10 DIAGNOSIS — D508 Other iron deficiency anemias: Secondary | ICD-10-CM | POA: Diagnosis not present

## 2015-05-10 DIAGNOSIS — N2581 Secondary hyperparathyroidism of renal origin: Secondary | ICD-10-CM | POA: Diagnosis not present

## 2015-05-12 DIAGNOSIS — D509 Iron deficiency anemia, unspecified: Secondary | ICD-10-CM | POA: Diagnosis not present

## 2015-05-12 DIAGNOSIS — E1129 Type 2 diabetes mellitus with other diabetic kidney complication: Secondary | ICD-10-CM | POA: Diagnosis not present

## 2015-05-12 DIAGNOSIS — N186 End stage renal disease: Secondary | ICD-10-CM | POA: Diagnosis not present

## 2015-05-12 DIAGNOSIS — D508 Other iron deficiency anemias: Secondary | ICD-10-CM | POA: Diagnosis not present

## 2015-05-12 DIAGNOSIS — N2581 Secondary hyperparathyroidism of renal origin: Secondary | ICD-10-CM | POA: Diagnosis not present

## 2015-05-15 DIAGNOSIS — N186 End stage renal disease: Secondary | ICD-10-CM | POA: Diagnosis not present

## 2015-05-15 DIAGNOSIS — E1129 Type 2 diabetes mellitus with other diabetic kidney complication: Secondary | ICD-10-CM | POA: Diagnosis not present

## 2015-05-15 DIAGNOSIS — D508 Other iron deficiency anemias: Secondary | ICD-10-CM | POA: Diagnosis not present

## 2015-05-15 DIAGNOSIS — D509 Iron deficiency anemia, unspecified: Secondary | ICD-10-CM | POA: Diagnosis not present

## 2015-05-15 DIAGNOSIS — N2581 Secondary hyperparathyroidism of renal origin: Secondary | ICD-10-CM | POA: Diagnosis not present

## 2015-05-16 DIAGNOSIS — S82851D Displaced trimalleolar fracture of right lower leg, subsequent encounter for closed fracture with routine healing: Secondary | ICD-10-CM | POA: Diagnosis not present

## 2015-05-16 DIAGNOSIS — B351 Tinea unguium: Secondary | ICD-10-CM | POA: Diagnosis not present

## 2015-05-16 DIAGNOSIS — L812 Freckles: Secondary | ICD-10-CM | POA: Diagnosis not present

## 2015-05-16 DIAGNOSIS — L821 Other seborrheic keratosis: Secondary | ICD-10-CM | POA: Diagnosis not present

## 2015-05-16 DIAGNOSIS — E1142 Type 2 diabetes mellitus with diabetic polyneuropathy: Secondary | ICD-10-CM | POA: Diagnosis not present

## 2015-05-16 DIAGNOSIS — D485 Neoplasm of uncertain behavior of skin: Secondary | ICD-10-CM | POA: Diagnosis not present

## 2015-05-17 DIAGNOSIS — D508 Other iron deficiency anemias: Secondary | ICD-10-CM | POA: Diagnosis not present

## 2015-05-17 DIAGNOSIS — N186 End stage renal disease: Secondary | ICD-10-CM | POA: Diagnosis not present

## 2015-05-17 DIAGNOSIS — N2581 Secondary hyperparathyroidism of renal origin: Secondary | ICD-10-CM | POA: Diagnosis not present

## 2015-05-17 DIAGNOSIS — E1129 Type 2 diabetes mellitus with other diabetic kidney complication: Secondary | ICD-10-CM | POA: Diagnosis not present

## 2015-05-17 DIAGNOSIS — D509 Iron deficiency anemia, unspecified: Secondary | ICD-10-CM | POA: Diagnosis not present

## 2015-05-19 DIAGNOSIS — D509 Iron deficiency anemia, unspecified: Secondary | ICD-10-CM | POA: Diagnosis not present

## 2015-05-19 DIAGNOSIS — N186 End stage renal disease: Secondary | ICD-10-CM | POA: Diagnosis not present

## 2015-05-19 DIAGNOSIS — D508 Other iron deficiency anemias: Secondary | ICD-10-CM | POA: Diagnosis not present

## 2015-05-19 DIAGNOSIS — N2581 Secondary hyperparathyroidism of renal origin: Secondary | ICD-10-CM | POA: Diagnosis not present

## 2015-05-19 DIAGNOSIS — E1129 Type 2 diabetes mellitus with other diabetic kidney complication: Secondary | ICD-10-CM | POA: Diagnosis not present

## 2015-05-22 DIAGNOSIS — N2581 Secondary hyperparathyroidism of renal origin: Secondary | ICD-10-CM | POA: Diagnosis not present

## 2015-05-22 DIAGNOSIS — D508 Other iron deficiency anemias: Secondary | ICD-10-CM | POA: Diagnosis not present

## 2015-05-22 DIAGNOSIS — N186 End stage renal disease: Secondary | ICD-10-CM | POA: Diagnosis not present

## 2015-05-22 DIAGNOSIS — E1129 Type 2 diabetes mellitus with other diabetic kidney complication: Secondary | ICD-10-CM | POA: Diagnosis not present

## 2015-05-22 DIAGNOSIS — D509 Iron deficiency anemia, unspecified: Secondary | ICD-10-CM | POA: Diagnosis not present

## 2015-05-24 DIAGNOSIS — N2581 Secondary hyperparathyroidism of renal origin: Secondary | ICD-10-CM | POA: Diagnosis not present

## 2015-05-24 DIAGNOSIS — E1129 Type 2 diabetes mellitus with other diabetic kidney complication: Secondary | ICD-10-CM | POA: Diagnosis not present

## 2015-05-24 DIAGNOSIS — D508 Other iron deficiency anemias: Secondary | ICD-10-CM | POA: Diagnosis not present

## 2015-05-24 DIAGNOSIS — N186 End stage renal disease: Secondary | ICD-10-CM | POA: Diagnosis not present

## 2015-05-24 DIAGNOSIS — D509 Iron deficiency anemia, unspecified: Secondary | ICD-10-CM | POA: Diagnosis not present

## 2015-05-26 DIAGNOSIS — N2581 Secondary hyperparathyroidism of renal origin: Secondary | ICD-10-CM | POA: Diagnosis not present

## 2015-05-26 DIAGNOSIS — N186 End stage renal disease: Secondary | ICD-10-CM | POA: Diagnosis not present

## 2015-05-26 DIAGNOSIS — E1129 Type 2 diabetes mellitus with other diabetic kidney complication: Secondary | ICD-10-CM | POA: Diagnosis not present

## 2015-05-26 DIAGNOSIS — D509 Iron deficiency anemia, unspecified: Secondary | ICD-10-CM | POA: Diagnosis not present

## 2015-05-26 DIAGNOSIS — D508 Other iron deficiency anemias: Secondary | ICD-10-CM | POA: Diagnosis not present

## 2015-05-27 DIAGNOSIS — Z992 Dependence on renal dialysis: Secondary | ICD-10-CM | POA: Diagnosis not present

## 2015-05-27 DIAGNOSIS — N186 End stage renal disease: Secondary | ICD-10-CM | POA: Diagnosis not present

## 2015-05-27 DIAGNOSIS — E1129 Type 2 diabetes mellitus with other diabetic kidney complication: Secondary | ICD-10-CM | POA: Diagnosis not present

## 2015-05-29 DIAGNOSIS — N186 End stage renal disease: Secondary | ICD-10-CM | POA: Diagnosis not present

## 2015-05-29 DIAGNOSIS — E1129 Type 2 diabetes mellitus with other diabetic kidney complication: Secondary | ICD-10-CM | POA: Diagnosis not present

## 2015-05-29 DIAGNOSIS — N2581 Secondary hyperparathyroidism of renal origin: Secondary | ICD-10-CM | POA: Diagnosis not present

## 2015-05-30 DIAGNOSIS — N186 End stage renal disease: Secondary | ICD-10-CM | POA: Diagnosis not present

## 2015-05-30 DIAGNOSIS — T82858D Stenosis of vascular prosthetic devices, implants and grafts, subsequent encounter: Secondary | ICD-10-CM | POA: Diagnosis not present

## 2015-05-30 DIAGNOSIS — I771 Stricture of artery: Secondary | ICD-10-CM | POA: Diagnosis not present

## 2015-05-30 DIAGNOSIS — Z992 Dependence on renal dialysis: Secondary | ICD-10-CM | POA: Diagnosis not present

## 2015-05-31 DIAGNOSIS — E1129 Type 2 diabetes mellitus with other diabetic kidney complication: Secondary | ICD-10-CM | POA: Diagnosis not present

## 2015-05-31 DIAGNOSIS — N186 End stage renal disease: Secondary | ICD-10-CM | POA: Diagnosis not present

## 2015-05-31 DIAGNOSIS — N2581 Secondary hyperparathyroidism of renal origin: Secondary | ICD-10-CM | POA: Diagnosis not present

## 2015-06-01 DIAGNOSIS — I10 Essential (primary) hypertension: Secondary | ICD-10-CM | POA: Diagnosis not present

## 2015-06-01 DIAGNOSIS — E782 Mixed hyperlipidemia: Secondary | ICD-10-CM | POA: Diagnosis not present

## 2015-06-01 DIAGNOSIS — E1065 Type 1 diabetes mellitus with hyperglycemia: Secondary | ICD-10-CM | POA: Diagnosis not present

## 2015-06-02 DIAGNOSIS — N186 End stage renal disease: Secondary | ICD-10-CM | POA: Diagnosis not present

## 2015-06-02 DIAGNOSIS — E1129 Type 2 diabetes mellitus with other diabetic kidney complication: Secondary | ICD-10-CM | POA: Diagnosis not present

## 2015-06-02 DIAGNOSIS — N2581 Secondary hyperparathyroidism of renal origin: Secondary | ICD-10-CM | POA: Diagnosis not present

## 2015-06-05 DIAGNOSIS — E1129 Type 2 diabetes mellitus with other diabetic kidney complication: Secondary | ICD-10-CM | POA: Diagnosis not present

## 2015-06-05 DIAGNOSIS — N2581 Secondary hyperparathyroidism of renal origin: Secondary | ICD-10-CM | POA: Diagnosis not present

## 2015-06-05 DIAGNOSIS — N186 End stage renal disease: Secondary | ICD-10-CM | POA: Diagnosis not present

## 2015-06-07 DIAGNOSIS — N2581 Secondary hyperparathyroidism of renal origin: Secondary | ICD-10-CM | POA: Diagnosis not present

## 2015-06-07 DIAGNOSIS — S82851D Displaced trimalleolar fracture of right lower leg, subsequent encounter for closed fracture with routine healing: Secondary | ICD-10-CM | POA: Diagnosis not present

## 2015-06-07 DIAGNOSIS — E1129 Type 2 diabetes mellitus with other diabetic kidney complication: Secondary | ICD-10-CM | POA: Diagnosis not present

## 2015-06-07 DIAGNOSIS — N186 End stage renal disease: Secondary | ICD-10-CM | POA: Diagnosis not present

## 2015-06-09 DIAGNOSIS — N186 End stage renal disease: Secondary | ICD-10-CM | POA: Diagnosis not present

## 2015-06-09 DIAGNOSIS — N2581 Secondary hyperparathyroidism of renal origin: Secondary | ICD-10-CM | POA: Diagnosis not present

## 2015-06-09 DIAGNOSIS — E1129 Type 2 diabetes mellitus with other diabetic kidney complication: Secondary | ICD-10-CM | POA: Diagnosis not present

## 2015-06-12 DIAGNOSIS — N186 End stage renal disease: Secondary | ICD-10-CM | POA: Diagnosis not present

## 2015-06-12 DIAGNOSIS — N2581 Secondary hyperparathyroidism of renal origin: Secondary | ICD-10-CM | POA: Diagnosis not present

## 2015-06-12 DIAGNOSIS — E1129 Type 2 diabetes mellitus with other diabetic kidney complication: Secondary | ICD-10-CM | POA: Diagnosis not present

## 2015-06-13 ENCOUNTER — Other Ambulatory Visit: Payer: Self-pay | Admitting: Internal Medicine

## 2015-06-13 DIAGNOSIS — R911 Solitary pulmonary nodule: Secondary | ICD-10-CM

## 2015-06-14 DIAGNOSIS — E1129 Type 2 diabetes mellitus with other diabetic kidney complication: Secondary | ICD-10-CM | POA: Diagnosis not present

## 2015-06-14 DIAGNOSIS — N186 End stage renal disease: Secondary | ICD-10-CM | POA: Diagnosis not present

## 2015-06-14 DIAGNOSIS — N2581 Secondary hyperparathyroidism of renal origin: Secondary | ICD-10-CM | POA: Diagnosis not present

## 2015-06-16 DIAGNOSIS — N2581 Secondary hyperparathyroidism of renal origin: Secondary | ICD-10-CM | POA: Diagnosis not present

## 2015-06-16 DIAGNOSIS — N186 End stage renal disease: Secondary | ICD-10-CM | POA: Diagnosis not present

## 2015-06-16 DIAGNOSIS — E1129 Type 2 diabetes mellitus with other diabetic kidney complication: Secondary | ICD-10-CM | POA: Diagnosis not present

## 2015-06-19 DIAGNOSIS — E1129 Type 2 diabetes mellitus with other diabetic kidney complication: Secondary | ICD-10-CM | POA: Diagnosis not present

## 2015-06-19 DIAGNOSIS — N186 End stage renal disease: Secondary | ICD-10-CM | POA: Diagnosis not present

## 2015-06-19 DIAGNOSIS — N2581 Secondary hyperparathyroidism of renal origin: Secondary | ICD-10-CM | POA: Diagnosis not present

## 2015-06-21 DIAGNOSIS — E1129 Type 2 diabetes mellitus with other diabetic kidney complication: Secondary | ICD-10-CM | POA: Diagnosis not present

## 2015-06-21 DIAGNOSIS — N2581 Secondary hyperparathyroidism of renal origin: Secondary | ICD-10-CM | POA: Diagnosis not present

## 2015-06-21 DIAGNOSIS — N186 End stage renal disease: Secondary | ICD-10-CM | POA: Diagnosis not present

## 2015-06-23 DIAGNOSIS — N186 End stage renal disease: Secondary | ICD-10-CM | POA: Diagnosis not present

## 2015-06-23 DIAGNOSIS — E1129 Type 2 diabetes mellitus with other diabetic kidney complication: Secondary | ICD-10-CM | POA: Diagnosis not present

## 2015-06-23 DIAGNOSIS — N2581 Secondary hyperparathyroidism of renal origin: Secondary | ICD-10-CM | POA: Diagnosis not present

## 2015-06-26 DIAGNOSIS — N2581 Secondary hyperparathyroidism of renal origin: Secondary | ICD-10-CM | POA: Diagnosis not present

## 2015-06-26 DIAGNOSIS — N186 End stage renal disease: Secondary | ICD-10-CM | POA: Diagnosis not present

## 2015-06-26 DIAGNOSIS — E1129 Type 2 diabetes mellitus with other diabetic kidney complication: Secondary | ICD-10-CM | POA: Diagnosis not present

## 2015-06-27 DIAGNOSIS — E1129 Type 2 diabetes mellitus with other diabetic kidney complication: Secondary | ICD-10-CM | POA: Diagnosis not present

## 2015-06-27 DIAGNOSIS — N186 End stage renal disease: Secondary | ICD-10-CM | POA: Diagnosis not present

## 2015-06-27 DIAGNOSIS — Z992 Dependence on renal dialysis: Secondary | ICD-10-CM | POA: Diagnosis not present

## 2015-06-28 DIAGNOSIS — D631 Anemia in chronic kidney disease: Secondary | ICD-10-CM | POA: Diagnosis not present

## 2015-06-28 DIAGNOSIS — E1129 Type 2 diabetes mellitus with other diabetic kidney complication: Secondary | ICD-10-CM | POA: Diagnosis not present

## 2015-06-28 DIAGNOSIS — N2581 Secondary hyperparathyroidism of renal origin: Secondary | ICD-10-CM | POA: Diagnosis not present

## 2015-06-28 DIAGNOSIS — N186 End stage renal disease: Secondary | ICD-10-CM | POA: Diagnosis not present

## 2015-06-30 DIAGNOSIS — E1129 Type 2 diabetes mellitus with other diabetic kidney complication: Secondary | ICD-10-CM | POA: Diagnosis not present

## 2015-06-30 DIAGNOSIS — N186 End stage renal disease: Secondary | ICD-10-CM | POA: Diagnosis not present

## 2015-06-30 DIAGNOSIS — N2581 Secondary hyperparathyroidism of renal origin: Secondary | ICD-10-CM | POA: Diagnosis not present

## 2015-06-30 DIAGNOSIS — D631 Anemia in chronic kidney disease: Secondary | ICD-10-CM | POA: Diagnosis not present

## 2015-07-03 DIAGNOSIS — N186 End stage renal disease: Secondary | ICD-10-CM | POA: Diagnosis not present

## 2015-07-03 DIAGNOSIS — E1129 Type 2 diabetes mellitus with other diabetic kidney complication: Secondary | ICD-10-CM | POA: Diagnosis not present

## 2015-07-03 DIAGNOSIS — N2581 Secondary hyperparathyroidism of renal origin: Secondary | ICD-10-CM | POA: Diagnosis not present

## 2015-07-03 DIAGNOSIS — D631 Anemia in chronic kidney disease: Secondary | ICD-10-CM | POA: Diagnosis not present

## 2015-07-05 DIAGNOSIS — N186 End stage renal disease: Secondary | ICD-10-CM | POA: Diagnosis not present

## 2015-07-05 DIAGNOSIS — N2581 Secondary hyperparathyroidism of renal origin: Secondary | ICD-10-CM | POA: Diagnosis not present

## 2015-07-05 DIAGNOSIS — D631 Anemia in chronic kidney disease: Secondary | ICD-10-CM | POA: Diagnosis not present

## 2015-07-05 DIAGNOSIS — E1129 Type 2 diabetes mellitus with other diabetic kidney complication: Secondary | ICD-10-CM | POA: Diagnosis not present

## 2015-07-07 DIAGNOSIS — N2581 Secondary hyperparathyroidism of renal origin: Secondary | ICD-10-CM | POA: Diagnosis not present

## 2015-07-07 DIAGNOSIS — E1129 Type 2 diabetes mellitus with other diabetic kidney complication: Secondary | ICD-10-CM | POA: Diagnosis not present

## 2015-07-07 DIAGNOSIS — N186 End stage renal disease: Secondary | ICD-10-CM | POA: Diagnosis not present

## 2015-07-07 DIAGNOSIS — D631 Anemia in chronic kidney disease: Secondary | ICD-10-CM | POA: Diagnosis not present

## 2015-07-09 DIAGNOSIS — J209 Acute bronchitis, unspecified: Secondary | ICD-10-CM | POA: Diagnosis not present

## 2015-07-09 DIAGNOSIS — J069 Acute upper respiratory infection, unspecified: Secondary | ICD-10-CM | POA: Diagnosis not present

## 2015-07-09 DIAGNOSIS — R011 Cardiac murmur, unspecified: Secondary | ICD-10-CM | POA: Diagnosis not present

## 2015-07-10 DIAGNOSIS — E1129 Type 2 diabetes mellitus with other diabetic kidney complication: Secondary | ICD-10-CM | POA: Diagnosis not present

## 2015-07-10 DIAGNOSIS — D631 Anemia in chronic kidney disease: Secondary | ICD-10-CM | POA: Diagnosis not present

## 2015-07-10 DIAGNOSIS — N186 End stage renal disease: Secondary | ICD-10-CM | POA: Diagnosis not present

## 2015-07-10 DIAGNOSIS — N2581 Secondary hyperparathyroidism of renal origin: Secondary | ICD-10-CM | POA: Diagnosis not present

## 2015-07-12 DIAGNOSIS — N186 End stage renal disease: Secondary | ICD-10-CM | POA: Diagnosis not present

## 2015-07-12 DIAGNOSIS — E1129 Type 2 diabetes mellitus with other diabetic kidney complication: Secondary | ICD-10-CM | POA: Diagnosis not present

## 2015-07-12 DIAGNOSIS — N2581 Secondary hyperparathyroidism of renal origin: Secondary | ICD-10-CM | POA: Diagnosis not present

## 2015-07-12 DIAGNOSIS — D631 Anemia in chronic kidney disease: Secondary | ICD-10-CM | POA: Diagnosis not present

## 2015-07-14 DIAGNOSIS — N186 End stage renal disease: Secondary | ICD-10-CM | POA: Diagnosis not present

## 2015-07-14 DIAGNOSIS — E1129 Type 2 diabetes mellitus with other diabetic kidney complication: Secondary | ICD-10-CM | POA: Diagnosis not present

## 2015-07-14 DIAGNOSIS — D631 Anemia in chronic kidney disease: Secondary | ICD-10-CM | POA: Diagnosis not present

## 2015-07-14 DIAGNOSIS — N2581 Secondary hyperparathyroidism of renal origin: Secondary | ICD-10-CM | POA: Diagnosis not present

## 2015-07-16 DIAGNOSIS — J159 Unspecified bacterial pneumonia: Secondary | ICD-10-CM | POA: Diagnosis not present

## 2015-07-16 DIAGNOSIS — R0602 Shortness of breath: Secondary | ICD-10-CM | POA: Diagnosis not present

## 2015-07-17 DIAGNOSIS — N2581 Secondary hyperparathyroidism of renal origin: Secondary | ICD-10-CM | POA: Diagnosis not present

## 2015-07-17 DIAGNOSIS — D631 Anemia in chronic kidney disease: Secondary | ICD-10-CM | POA: Diagnosis not present

## 2015-07-17 DIAGNOSIS — E1129 Type 2 diabetes mellitus with other diabetic kidney complication: Secondary | ICD-10-CM | POA: Diagnosis not present

## 2015-07-17 DIAGNOSIS — N186 End stage renal disease: Secondary | ICD-10-CM | POA: Diagnosis not present

## 2015-07-19 DIAGNOSIS — E1129 Type 2 diabetes mellitus with other diabetic kidney complication: Secondary | ICD-10-CM | POA: Diagnosis not present

## 2015-07-19 DIAGNOSIS — N186 End stage renal disease: Secondary | ICD-10-CM | POA: Diagnosis not present

## 2015-07-19 DIAGNOSIS — N2581 Secondary hyperparathyroidism of renal origin: Secondary | ICD-10-CM | POA: Diagnosis not present

## 2015-07-19 DIAGNOSIS — D631 Anemia in chronic kidney disease: Secondary | ICD-10-CM | POA: Diagnosis not present

## 2015-07-21 DIAGNOSIS — N186 End stage renal disease: Secondary | ICD-10-CM | POA: Diagnosis not present

## 2015-07-21 DIAGNOSIS — D631 Anemia in chronic kidney disease: Secondary | ICD-10-CM | POA: Diagnosis not present

## 2015-07-21 DIAGNOSIS — E1129 Type 2 diabetes mellitus with other diabetic kidney complication: Secondary | ICD-10-CM | POA: Diagnosis not present

## 2015-07-21 DIAGNOSIS — N2581 Secondary hyperparathyroidism of renal origin: Secondary | ICD-10-CM | POA: Diagnosis not present

## 2015-07-24 DIAGNOSIS — E1129 Type 2 diabetes mellitus with other diabetic kidney complication: Secondary | ICD-10-CM | POA: Diagnosis not present

## 2015-07-24 DIAGNOSIS — N2581 Secondary hyperparathyroidism of renal origin: Secondary | ICD-10-CM | POA: Diagnosis not present

## 2015-07-24 DIAGNOSIS — N186 End stage renal disease: Secondary | ICD-10-CM | POA: Diagnosis not present

## 2015-07-24 DIAGNOSIS — D631 Anemia in chronic kidney disease: Secondary | ICD-10-CM | POA: Diagnosis not present

## 2015-07-25 DIAGNOSIS — Z992 Dependence on renal dialysis: Secondary | ICD-10-CM | POA: Diagnosis not present

## 2015-07-25 DIAGNOSIS — N186 End stage renal disease: Secondary | ICD-10-CM | POA: Diagnosis not present

## 2015-07-25 DIAGNOSIS — E1129 Type 2 diabetes mellitus with other diabetic kidney complication: Secondary | ICD-10-CM | POA: Diagnosis not present

## 2015-07-26 DIAGNOSIS — E1129 Type 2 diabetes mellitus with other diabetic kidney complication: Secondary | ICD-10-CM | POA: Diagnosis not present

## 2015-07-26 DIAGNOSIS — N186 End stage renal disease: Secondary | ICD-10-CM | POA: Diagnosis not present

## 2015-07-26 DIAGNOSIS — D509 Iron deficiency anemia, unspecified: Secondary | ICD-10-CM | POA: Diagnosis not present

## 2015-07-26 DIAGNOSIS — N2581 Secondary hyperparathyroidism of renal origin: Secondary | ICD-10-CM | POA: Diagnosis not present

## 2015-07-26 DIAGNOSIS — D631 Anemia in chronic kidney disease: Secondary | ICD-10-CM | POA: Diagnosis not present

## 2015-07-28 DIAGNOSIS — E1129 Type 2 diabetes mellitus with other diabetic kidney complication: Secondary | ICD-10-CM | POA: Diagnosis not present

## 2015-07-28 DIAGNOSIS — N2581 Secondary hyperparathyroidism of renal origin: Secondary | ICD-10-CM | POA: Diagnosis not present

## 2015-07-28 DIAGNOSIS — D509 Iron deficiency anemia, unspecified: Secondary | ICD-10-CM | POA: Diagnosis not present

## 2015-07-28 DIAGNOSIS — N186 End stage renal disease: Secondary | ICD-10-CM | POA: Diagnosis not present

## 2015-07-28 DIAGNOSIS — D631 Anemia in chronic kidney disease: Secondary | ICD-10-CM | POA: Diagnosis not present

## 2015-07-31 DIAGNOSIS — N2581 Secondary hyperparathyroidism of renal origin: Secondary | ICD-10-CM | POA: Diagnosis not present

## 2015-07-31 DIAGNOSIS — E1129 Type 2 diabetes mellitus with other diabetic kidney complication: Secondary | ICD-10-CM | POA: Diagnosis not present

## 2015-07-31 DIAGNOSIS — D631 Anemia in chronic kidney disease: Secondary | ICD-10-CM | POA: Diagnosis not present

## 2015-07-31 DIAGNOSIS — N186 End stage renal disease: Secondary | ICD-10-CM | POA: Diagnosis not present

## 2015-07-31 DIAGNOSIS — D509 Iron deficiency anemia, unspecified: Secondary | ICD-10-CM | POA: Diagnosis not present

## 2015-08-01 DIAGNOSIS — R030 Elevated blood-pressure reading, without diagnosis of hypertension: Secondary | ICD-10-CM | POA: Diagnosis not present

## 2015-08-01 DIAGNOSIS — R0989 Other specified symptoms and signs involving the circulatory and respiratory systems: Secondary | ICD-10-CM | POA: Diagnosis not present

## 2015-08-01 DIAGNOSIS — G4489 Other headache syndrome: Secondary | ICD-10-CM | POA: Diagnosis not present

## 2015-08-01 DIAGNOSIS — G43009 Migraine without aura, not intractable, without status migrainosus: Secondary | ICD-10-CM | POA: Diagnosis not present

## 2015-08-02 DIAGNOSIS — N2581 Secondary hyperparathyroidism of renal origin: Secondary | ICD-10-CM | POA: Diagnosis not present

## 2015-08-02 DIAGNOSIS — D509 Iron deficiency anemia, unspecified: Secondary | ICD-10-CM | POA: Diagnosis not present

## 2015-08-02 DIAGNOSIS — N186 End stage renal disease: Secondary | ICD-10-CM | POA: Diagnosis not present

## 2015-08-02 DIAGNOSIS — E1129 Type 2 diabetes mellitus with other diabetic kidney complication: Secondary | ICD-10-CM | POA: Diagnosis not present

## 2015-08-02 DIAGNOSIS — D631 Anemia in chronic kidney disease: Secondary | ICD-10-CM | POA: Diagnosis not present

## 2015-08-04 DIAGNOSIS — D631 Anemia in chronic kidney disease: Secondary | ICD-10-CM | POA: Diagnosis not present

## 2015-08-04 DIAGNOSIS — E1129 Type 2 diabetes mellitus with other diabetic kidney complication: Secondary | ICD-10-CM | POA: Diagnosis not present

## 2015-08-04 DIAGNOSIS — N186 End stage renal disease: Secondary | ICD-10-CM | POA: Diagnosis not present

## 2015-08-04 DIAGNOSIS — D509 Iron deficiency anemia, unspecified: Secondary | ICD-10-CM | POA: Diagnosis not present

## 2015-08-04 DIAGNOSIS — N2581 Secondary hyperparathyroidism of renal origin: Secondary | ICD-10-CM | POA: Diagnosis not present

## 2015-08-07 DIAGNOSIS — E1129 Type 2 diabetes mellitus with other diabetic kidney complication: Secondary | ICD-10-CM | POA: Diagnosis not present

## 2015-08-07 DIAGNOSIS — N186 End stage renal disease: Secondary | ICD-10-CM | POA: Diagnosis not present

## 2015-08-07 DIAGNOSIS — D509 Iron deficiency anemia, unspecified: Secondary | ICD-10-CM | POA: Diagnosis not present

## 2015-08-07 DIAGNOSIS — D631 Anemia in chronic kidney disease: Secondary | ICD-10-CM | POA: Diagnosis not present

## 2015-08-07 DIAGNOSIS — N2581 Secondary hyperparathyroidism of renal origin: Secondary | ICD-10-CM | POA: Diagnosis not present

## 2015-08-08 ENCOUNTER — Other Ambulatory Visit: Payer: Self-pay | Admitting: Neurology

## 2015-08-08 DIAGNOSIS — D485 Neoplasm of uncertain behavior of skin: Secondary | ICD-10-CM | POA: Diagnosis not present

## 2015-08-08 DIAGNOSIS — R0989 Other specified symptoms and signs involving the circulatory and respiratory systems: Secondary | ICD-10-CM

## 2015-08-09 DIAGNOSIS — D509 Iron deficiency anemia, unspecified: Secondary | ICD-10-CM | POA: Diagnosis not present

## 2015-08-09 DIAGNOSIS — N186 End stage renal disease: Secondary | ICD-10-CM | POA: Diagnosis not present

## 2015-08-09 DIAGNOSIS — D631 Anemia in chronic kidney disease: Secondary | ICD-10-CM | POA: Diagnosis not present

## 2015-08-09 DIAGNOSIS — E1129 Type 2 diabetes mellitus with other diabetic kidney complication: Secondary | ICD-10-CM | POA: Diagnosis not present

## 2015-08-09 DIAGNOSIS — N2581 Secondary hyperparathyroidism of renal origin: Secondary | ICD-10-CM | POA: Diagnosis not present

## 2015-08-11 DIAGNOSIS — D509 Iron deficiency anemia, unspecified: Secondary | ICD-10-CM | POA: Diagnosis not present

## 2015-08-11 DIAGNOSIS — E1129 Type 2 diabetes mellitus with other diabetic kidney complication: Secondary | ICD-10-CM | POA: Diagnosis not present

## 2015-08-11 DIAGNOSIS — N186 End stage renal disease: Secondary | ICD-10-CM | POA: Diagnosis not present

## 2015-08-11 DIAGNOSIS — N2581 Secondary hyperparathyroidism of renal origin: Secondary | ICD-10-CM | POA: Diagnosis not present

## 2015-08-11 DIAGNOSIS — D631 Anemia in chronic kidney disease: Secondary | ICD-10-CM | POA: Diagnosis not present

## 2015-08-14 DIAGNOSIS — D631 Anemia in chronic kidney disease: Secondary | ICD-10-CM | POA: Diagnosis not present

## 2015-08-14 DIAGNOSIS — N2581 Secondary hyperparathyroidism of renal origin: Secondary | ICD-10-CM | POA: Diagnosis not present

## 2015-08-14 DIAGNOSIS — E1129 Type 2 diabetes mellitus with other diabetic kidney complication: Secondary | ICD-10-CM | POA: Diagnosis not present

## 2015-08-14 DIAGNOSIS — D509 Iron deficiency anemia, unspecified: Secondary | ICD-10-CM | POA: Diagnosis not present

## 2015-08-14 DIAGNOSIS — N186 End stage renal disease: Secondary | ICD-10-CM | POA: Diagnosis not present

## 2015-08-16 DIAGNOSIS — E1129 Type 2 diabetes mellitus with other diabetic kidney complication: Secondary | ICD-10-CM | POA: Diagnosis not present

## 2015-08-16 DIAGNOSIS — N186 End stage renal disease: Secondary | ICD-10-CM | POA: Diagnosis not present

## 2015-08-16 DIAGNOSIS — D631 Anemia in chronic kidney disease: Secondary | ICD-10-CM | POA: Diagnosis not present

## 2015-08-16 DIAGNOSIS — D509 Iron deficiency anemia, unspecified: Secondary | ICD-10-CM | POA: Diagnosis not present

## 2015-08-16 DIAGNOSIS — N2581 Secondary hyperparathyroidism of renal origin: Secondary | ICD-10-CM | POA: Diagnosis not present

## 2015-08-18 DIAGNOSIS — D509 Iron deficiency anemia, unspecified: Secondary | ICD-10-CM | POA: Diagnosis not present

## 2015-08-18 DIAGNOSIS — N186 End stage renal disease: Secondary | ICD-10-CM | POA: Diagnosis not present

## 2015-08-18 DIAGNOSIS — D631 Anemia in chronic kidney disease: Secondary | ICD-10-CM | POA: Diagnosis not present

## 2015-08-18 DIAGNOSIS — N2581 Secondary hyperparathyroidism of renal origin: Secondary | ICD-10-CM | POA: Diagnosis not present

## 2015-08-18 DIAGNOSIS — E1129 Type 2 diabetes mellitus with other diabetic kidney complication: Secondary | ICD-10-CM | POA: Diagnosis not present

## 2015-08-21 DIAGNOSIS — D509 Iron deficiency anemia, unspecified: Secondary | ICD-10-CM | POA: Diagnosis not present

## 2015-08-21 DIAGNOSIS — E1129 Type 2 diabetes mellitus with other diabetic kidney complication: Secondary | ICD-10-CM | POA: Diagnosis not present

## 2015-08-21 DIAGNOSIS — N186 End stage renal disease: Secondary | ICD-10-CM | POA: Diagnosis not present

## 2015-08-21 DIAGNOSIS — D631 Anemia in chronic kidney disease: Secondary | ICD-10-CM | POA: Diagnosis not present

## 2015-08-21 DIAGNOSIS — N2581 Secondary hyperparathyroidism of renal origin: Secondary | ICD-10-CM | POA: Diagnosis not present

## 2015-08-22 DIAGNOSIS — S61201A Unspecified open wound of left index finger without damage to nail, initial encounter: Secondary | ICD-10-CM | POA: Diagnosis not present

## 2015-08-23 DIAGNOSIS — D509 Iron deficiency anemia, unspecified: Secondary | ICD-10-CM | POA: Diagnosis not present

## 2015-08-23 DIAGNOSIS — E1129 Type 2 diabetes mellitus with other diabetic kidney complication: Secondary | ICD-10-CM | POA: Diagnosis not present

## 2015-08-23 DIAGNOSIS — M25512 Pain in left shoulder: Secondary | ICD-10-CM | POA: Diagnosis not present

## 2015-08-23 DIAGNOSIS — M542 Cervicalgia: Secondary | ICD-10-CM | POA: Diagnosis not present

## 2015-08-23 DIAGNOSIS — N2581 Secondary hyperparathyroidism of renal origin: Secondary | ICD-10-CM | POA: Diagnosis not present

## 2015-08-23 DIAGNOSIS — N186 End stage renal disease: Secondary | ICD-10-CM | POA: Diagnosis not present

## 2015-08-23 DIAGNOSIS — D631 Anemia in chronic kidney disease: Secondary | ICD-10-CM | POA: Diagnosis not present

## 2015-08-23 DIAGNOSIS — M25511 Pain in right shoulder: Secondary | ICD-10-CM | POA: Diagnosis not present

## 2015-08-24 DIAGNOSIS — N186 End stage renal disease: Secondary | ICD-10-CM | POA: Diagnosis not present

## 2015-08-24 DIAGNOSIS — R600 Localized edema: Secondary | ICD-10-CM | POA: Diagnosis not present

## 2015-08-24 DIAGNOSIS — I5032 Chronic diastolic (congestive) heart failure: Secondary | ICD-10-CM | POA: Diagnosis not present

## 2015-08-24 DIAGNOSIS — E8770 Fluid overload, unspecified: Secondary | ICD-10-CM | POA: Diagnosis not present

## 2015-08-25 DIAGNOSIS — E1129 Type 2 diabetes mellitus with other diabetic kidney complication: Secondary | ICD-10-CM | POA: Diagnosis not present

## 2015-08-25 DIAGNOSIS — N2581 Secondary hyperparathyroidism of renal origin: Secondary | ICD-10-CM | POA: Diagnosis not present

## 2015-08-25 DIAGNOSIS — D631 Anemia in chronic kidney disease: Secondary | ICD-10-CM | POA: Diagnosis not present

## 2015-08-25 DIAGNOSIS — D509 Iron deficiency anemia, unspecified: Secondary | ICD-10-CM | POA: Diagnosis not present

## 2015-08-25 DIAGNOSIS — Z992 Dependence on renal dialysis: Secondary | ICD-10-CM | POA: Diagnosis not present

## 2015-08-25 DIAGNOSIS — N186 End stage renal disease: Secondary | ICD-10-CM | POA: Diagnosis not present

## 2015-08-26 ENCOUNTER — Emergency Department (HOSPITAL_BASED_OUTPATIENT_CLINIC_OR_DEPARTMENT_OTHER)
Admission: EM | Admit: 2015-08-26 | Discharge: 2015-08-26 | Disposition: A | Discharge status: Home / Self Care | Payer: Medicare Other | Attending: Emergency Medicine | Admitting: Emergency Medicine

## 2015-08-26 ENCOUNTER — Encounter (HOSPITAL_BASED_OUTPATIENT_CLINIC_OR_DEPARTMENT_OTHER): Payer: Self-pay

## 2015-08-26 ENCOUNTER — Emergency Department (HOSPITAL_BASED_OUTPATIENT_CLINIC_OR_DEPARTMENT_OTHER): Payer: Medicare Other

## 2015-08-26 DIAGNOSIS — Z992 Dependence on renal dialysis: Secondary | ICD-10-CM | POA: Insufficient documentation

## 2015-08-26 DIAGNOSIS — Z87891 Personal history of nicotine dependence: Secondary | ICD-10-CM | POA: Diagnosis not present

## 2015-08-26 DIAGNOSIS — E785 Hyperlipidemia, unspecified: Secondary | ICD-10-CM | POA: Insufficient documentation

## 2015-08-26 DIAGNOSIS — Z862 Personal history of diseases of the blood and blood-forming organs and certain disorders involving the immune mechanism: Secondary | ICD-10-CM | POA: Insufficient documentation

## 2015-08-26 DIAGNOSIS — Z9889 Other specified postprocedural states: Secondary | ICD-10-CM | POA: Insufficient documentation

## 2015-08-26 DIAGNOSIS — G629 Polyneuropathy, unspecified: Secondary | ICD-10-CM | POA: Insufficient documentation

## 2015-08-26 DIAGNOSIS — Z794 Long term (current) use of insulin: Secondary | ICD-10-CM | POA: Insufficient documentation

## 2015-08-26 DIAGNOSIS — Z7902 Long term (current) use of antithrombotics/antiplatelets: Secondary | ICD-10-CM | POA: Insufficient documentation

## 2015-08-26 DIAGNOSIS — R05 Cough: Secondary | ICD-10-CM | POA: Diagnosis not present

## 2015-08-26 DIAGNOSIS — G43909 Migraine, unspecified, not intractable, without status migrainosus: Secondary | ICD-10-CM | POA: Diagnosis not present

## 2015-08-26 DIAGNOSIS — Z79899 Other long term (current) drug therapy: Secondary | ICD-10-CM | POA: Diagnosis not present

## 2015-08-26 DIAGNOSIS — E11610 Type 2 diabetes mellitus with diabetic neuropathic arthropathy: Secondary | ICD-10-CM | POA: Diagnosis not present

## 2015-08-26 DIAGNOSIS — I120 Hypertensive chronic kidney disease with stage 5 chronic kidney disease or end stage renal disease: Secondary | ICD-10-CM | POA: Diagnosis not present

## 2015-08-26 DIAGNOSIS — Z872 Personal history of diseases of the skin and subcutaneous tissue: Secondary | ICD-10-CM | POA: Diagnosis not present

## 2015-08-26 DIAGNOSIS — I2510 Atherosclerotic heart disease of native coronary artery without angina pectoris: Secondary | ICD-10-CM | POA: Diagnosis not present

## 2015-08-26 DIAGNOSIS — N186 End stage renal disease: Secondary | ICD-10-CM

## 2015-08-26 DIAGNOSIS — Z8781 Personal history of (healed) traumatic fracture: Secondary | ICD-10-CM | POA: Diagnosis not present

## 2015-08-26 DIAGNOSIS — IMO0001 Reserved for inherently not codable concepts without codable children: Secondary | ICD-10-CM

## 2015-08-26 DIAGNOSIS — R0602 Shortness of breath: Secondary | ICD-10-CM | POA: Diagnosis not present

## 2015-08-26 DIAGNOSIS — R030 Elevated blood-pressure reading, without diagnosis of hypertension: Secondary | ICD-10-CM

## 2015-08-26 LAB — CBC WITH DIFFERENTIAL/PLATELET
BASOS ABS: 0 10*3/uL (ref 0.0–0.1)
BASOS PCT: 1 %
EOS ABS: 0.1 10*3/uL (ref 0.0–0.7)
Eosinophils Relative: 2 %
HCT: 32.1 % — ABNORMAL LOW (ref 36.0–46.0)
Hemoglobin: 10 g/dL — ABNORMAL LOW (ref 12.0–15.0)
Lymphocytes Relative: 18 %
Lymphs Abs: 0.7 10*3/uL (ref 0.7–4.0)
MCH: 30.9 pg (ref 26.0–34.0)
MCHC: 31.2 g/dL (ref 30.0–36.0)
MCV: 99.1 fL (ref 78.0–100.0)
MONO ABS: 0.4 10*3/uL (ref 0.1–1.0)
MONOS PCT: 11 %
NEUTROS ABS: 2.7 10*3/uL (ref 1.7–7.7)
Neutrophils Relative %: 68 %
PLATELETS: 134 10*3/uL — AB (ref 150–400)
RBC: 3.24 MIL/uL — ABNORMAL LOW (ref 3.87–5.11)
RDW: 14.9 % (ref 11.5–15.5)
WBC: 4 10*3/uL (ref 4.0–10.5)

## 2015-08-26 LAB — COMPREHENSIVE METABOLIC PANEL
ALBUMIN: 3.6 g/dL (ref 3.5–5.0)
ALT: 15 U/L (ref 14–54)
ANION GAP: 11 (ref 5–15)
AST: 19 U/L (ref 15–41)
Alkaline Phosphatase: 63 U/L (ref 38–126)
BUN: 28 mg/dL — ABNORMAL HIGH (ref 6–20)
CALCIUM: 8.5 mg/dL — AB (ref 8.9–10.3)
CHLORIDE: 95 mmol/L — AB (ref 101–111)
CO2: 31 mmol/L (ref 22–32)
Creatinine, Ser: 6.24 mg/dL — ABNORMAL HIGH (ref 0.44–1.00)
GFR calc non Af Amer: 8 mL/min — ABNORMAL LOW (ref >60)
GFR, EST AFRICAN AMERICAN: 9 mL/min — AB (ref >60)
GLUCOSE: 263 mg/dL — AB (ref 65–99)
POTASSIUM: 3.7 mmol/L (ref 3.5–5.1)
SODIUM: 137 mmol/L (ref 135–145)
Total Bilirubin: 1 mg/dL (ref 0.3–1.2)
Total Protein: 6.6 g/dL (ref 6.5–8.1)

## 2015-08-26 LAB — TROPONIN I: TROPONIN I: 0.05 ng/mL — AB (ref <0.031)

## 2015-08-26 LAB — BRAIN NATRIURETIC PEPTIDE: B Natriuretic Peptide: 3934.2 pg/mL — ABNORMAL HIGH (ref 0.0–100.0)

## 2015-08-26 MED ORDER — SODIUM POLYSTYRENE SULFONATE PO POWD: Freq: Once | ORAL | Status: DC

## 2015-08-26 NOTE — ED Notes (Signed)
Patient here with 1 week of increased shortness of breath and dry cough. Reports that she had her last dialysis treatment yesterday

## 2015-08-26 NOTE — ED Notes (Signed)
Denies any chest pain or pressure at this time.

## 2015-08-26 NOTE — ED Notes (Signed)
Patient ambulated 1 lap around the department. SAT remained 93-94% entire time. Denies SOB. Patient placed back on the monitor

## 2015-08-26 NOTE — Discharge Instructions (Signed)
Take Kayexalate tonight when you get home, continue usual home medications Please go to scheduled dialysis on Monday. Follow up with your primary doctor in 3 days for discussion of your diagnoses and further evaluation after today's visit; Please return to the ER for worsening shortness of breath, chest pain, new or worsening symptoms, any additional concerns.

## 2015-08-26 NOTE — ED Provider Notes (Signed)
CSN: [id number]     Arrival date & time 08/26/15  1401 History   First MD Initiated Contact with Patient 08/26/15 1408     Chief Complaint  Patient presents with  . Shortness of Breath     (Consider location/radiation/quality/duration/timing/severity/associated sxs/prior Treatment) The history is provided by the patient and medical records. No language interpreter was used.     [name] is a 40 y.o. female  with a PMH of ESRD on dialysis MWF, DM, HTN, HLD, CAD, PVD who presents to the Emergency Department complaining of worsening, constant shortness of breath and dry cough x 1 week. Patient had dialysis yesterday (Friday) at [facility], stating they were "adjusting my dialysis" and that her "dry weight was off" but unable to specify any further details. She denies chest pain, fever, abdominal pain, and lower extremity swelling.    Past Medical History  Diagnosis Date  . DM (diabetes mellitus) ([place])     Long-term insulin  . Hypertension   . Tobacco use disorder     Discontinued March 2012  . Hyperlipidemia     Hypertriglyceridemia 449 HDL 25  . Diabetic Charcot's joint disease ([place])   . Coronary artery disease     Status post cardiac catheterization June 2012 scattered coronary artery disease/atherosclerosis with 70-80% stenosis in a small right PDA.  . Gastroparesis   . Peripheral vascular disease ([place])     Tibial occlusive disease evaluated by Dr. Kellie Simmering in August 2011. Medical therapy  . 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 CIN III (cervical intraepithelial neoplasia grade III) with severe dysplasia     S/P LEEP AND CONE  . Chronic anemia     2nd to renal disease  . End stage renal disease on dialysis ([place]) 05/02/11    [facility]; M; W, F; last time 05/01/11  . Migraines     "just on dialysis days"  . Peripheral neuropathy (HCC)     related to DM  . History of abscesses in groins 12/06/2010  . Renal insufficiency     Dialysis since 2012  . Fracture of 5th metatarsal 2016   Right  . Trimalleolar fracture of ankle, closed 02/09/2015    Right   Past Surgical History  Procedure Laterality Date  . Av fistula placement  04/2010  . Dilation and curettage of uterus  2009  . Cervical biopsy  w/ loop electrode excision      h/o  . Cervical cone biopsy      h/o  . Colposcopy    . Cardiac catheterization [name] 02/10/2015    Procedure: Left Heart Cath and Coronary Angiography;  Surgeon: Dixie Dials, MD;  Location: [facility];  Service: Cardiovascular;  Laterality: [name];  . Orif ankle fracture Right 02/09/2015  . Orif ankle fracture Right 02/16/2015    Procedure: OPEN REDUCTION INTERNAL FIXATION (ORIF) RIGHT ANKLE FRACTURE;  Surgeon: Altamese Hewlett Neck, MD;  Location: [facility];  Service: Orthopedics;  Laterality: Right;   Family History  Problem Relation Age of Onset  . Diabetes Mother   . Hypertension Mother   . Diabetes Father   . Hyperlipidemia Father   . Hypertension Father   . Diabetes Sister   . Breast cancer Maternal Aunt     Age 58's  . Stroke Maternal Grandmother   . Diabetes Sister    Social History  Substance Use Topics  . Smoking status: Former Smoker -- 0.30 packs/day for 10 years    Types: Cigarettes    Quit date:  08/05/2011  . Smokeless tobacco: Never Used     Comment: "quit smoking cigarettes 07/2010"  . Alcohol Use: No   OB History    Gravida Para Term Preterm AB TAB SAB Ectopic Multiple Living   2 1 1  1     1      Review of Systems  Constitutional: Negative for fever and chills.  HENT: Negative for congestion.   Eyes: Negative for visual disturbance.  Respiratory: Positive for cough and shortness of breath. Negative for wheezing.   Cardiovascular: Negative.   Gastrointestinal: Negative for nausea, vomiting and abdominal pain.  Genitourinary: Negative for dysuria.  Musculoskeletal: Negative for back pain and neck pain.  Skin: Negative for rash.  Neurological: Negative for dizziness and headaches.      Allergies  Cephalexin and  Sulfamethoxazole-trimethoprim  Home Medications   Prior to Admission medications   Medication Sig Start Date End Date Taking? Authorizing Provider  acetaminophen (TYLENOL) 500 MG tablet Take 500 mg by mouth every 6 (six) hours as needed for mild pain or moderate pain.    Historical Provider, MD  amLODipine (NORVASC) 10 MG tablet Take 1 tablet (10 mg total) by mouth daily. Patient taking differently: Take 10 mg by mouth at bedtime.  07/05/13   Minus Breeding, MD  aspirin EC 81 MG EC tablet Take 1 tablet (81 mg total) by mouth daily. Patient not taking: Reported on 03/04/2015 02/18/15   Delfina Redwood, MD  atorvastatin (LIPITOR) 40 MG tablet Take 40 mg by mouth daily.    Historical Provider, MD  cinacalcet (SENSIPAR) 30 MG tablet Take 30 mg by mouth daily.    Historical Provider, MD  cloNIDine (CATAPRES) 0.3 MG tablet Take 1 tablet (0.3 mg total) by mouth 3 (three) times daily. 11/01/14   Debbe Odea, MD  clopidogrel (PLAVIX) 75 MG tablet Take 75 mg by mouth at bedtime.     Historical Provider, MD  gabapentin (NEURONTIN) 600 MG tablet Take 1 tablet (600 mg total) by mouth at bedtime. 02/18/15   Delfina Redwood, MD  HUMALOG MIX 50/50 KWIKPEN (50-50) 100 UNIT/ML Kwikpen Inject 26-28 Units into the skin 2 (two) times daily. 28 in the morning and 26 in the evening. 04/29/14   Historical Provider, MD  hydrALAZINE (APRESOLINE) 50 MG tablet Take 1 tablet (50 mg total) by mouth every 8 (eight) hours. 11/01/14   Debbe Odea, MD  isosorbide mononitrate (IMDUR) 60 MG 24 hr tablet Take 60 mg by mouth daily.    Historical Provider, MD  labetalol (NORMODYNE) 300 MG tablet Take 1 tablet (300 mg total) by mouth 3 (three) times daily. 11/01/14   Debbe Odea, MD  lamoTRIgine (LAMICTAL) 25 MG tablet Take 50 mg by mouth 2 (two) times daily.     Historical Provider, MD  minoxidil (LONITEN) 10 MG tablet Take 10 mg by mouth 2 (two) times daily.    Historical Provider, MD  RENVELA 800 MG tablet Take 1 tablet by mouth 3  (three) times daily with meals.  04/17/13   Historical Provider, MD  sodium polystyrene (KAYEXALATE) powder Take by mouth once. 08/26/15   Kylen Ismael Pilcher Peretz Thieme, PA-C   BP 163/100 mmHg  Pulse 76  Temp(Src) 98.7 F (37.1 C) (Oral)  Resp 16  Ht 6\' 4"  (1.93 m)  Wt 108.863 kg  BMI 29.23 kg/m2  SpO2 97%  LMP 08/05/2015 (Approximate) Physical Exam  Constitutional: She is oriented to person, place, and time. She appears well-developed and well-nourished.  Alert and in no acute  distress  HENT:  Head: Normocephalic and atraumatic.  Cardiovascular: Normal rate, regular rhythm, normal heart sounds and intact distal pulses.  Exam reveals no gallop and no friction rub.   No murmur heard. Pulmonary/Chest: She has no wheezes. She has no rales.  Mildly tachypneic. + crackles at left lung base.   Abdominal: Soft. Bowel sounds are normal. She exhibits no distension and no mass. There is no tenderness. There is no rebound and no guarding.  Musculoskeletal: She exhibits no edema.  Neurological: She is alert and oriented to person, place, and time.  Skin: Skin is warm and dry.  Nursing note and vitals reviewed.   ED Course  Procedures (including critical care time) Labs Review Labs Reviewed  CBC WITH DIFFERENTIAL/PLATELET - Abnormal; Notable for the following:    RBC 3.24 (*)    Hemoglobin 10.0 (*)    HCT 32.1 (*)    Platelets 134 (*)    All other components within normal limits  TROPONIN I - Abnormal; Notable for the following:    Troponin I 0.05 (*)    All other components within normal limits  COMPREHENSIVE METABOLIC PANEL - Abnormal; Notable for the following:    Chloride 95 (*)    Glucose, Bld 263 (*)    BUN 28 (*)    Creatinine, Ser 6.24 (*)    Calcium 8.5 (*)    GFR calc non Af Amer 8 (*)    GFR calc Af Amer 9 (*)    All other components within normal limits  BRAIN NATRIURETIC PEPTIDE - Abnormal; Notable for the following:    B Natriuretic Peptide 3934.2 (*)    All other components  within normal limits    Imaging Review Dg Chest 2 View  08/26/2015  CLINICAL DATA:  Shortness of breath, cough, coronary disease and peripheral vascular disease. EXAM: CHEST  2 VIEW COMPARISON:  02/09/2015 FINDINGS: Marked cardiomegaly with increased vascular congestion and basilar interstitial changes, suspect early developing edema. Minor associated basilar atelectasis. No effusion or pneumothorax. No focal pneumonia, collapse or consolidation. Trachea is midline. Monitor leads overlie the chest. No acute osseous finding. IMPRESSION: Cardiomegaly with increased vascular congestion and developing basilar edema. Minor associated basilar atelectasis Electronically Signed   By: Jerilynn Mages.  Shick M.D.   On: 08/26/2015 14:45   I have personally reviewed and evaluated these images and lab results as part of my medical decision-making.   EKG Interpretation   Date/Time:  Saturday August 26 2015 14:16:50 EDT Ventricular Rate:  77 PR Interval:  160 QRS Duration: 111 QT Interval:  474 QTC Calculation: 536 R Axis:   -14 Text Interpretation:  Sinus rhythm LVH with secondary repolarization  abnormality Prolonged QT interval No significant change since last tracing  Confirmed by YAO  MD, DAVID ([id number]) on 08/26/2015 2:26:18 PM      MDM   Final diagnoses:  Shortness of breath  End stage renal disease on dialysis (HCC)  Elevated blood pressure   [name] presents for one week history of constant, worsening shortness of breath. She is an ESRD patient on MWF dialysis with last treatment yesterday (Friday). She states that her dialysis center has been adjusting treatments, but is unable to give further specifics into changes.   Labs: CBC, CMP, and Troponin all reviewed and appear at baseline. BNP of 3923.2 (last value 2024 10 months prior).  Imaging: CXR shows cardiomegaly with increased vascular congestion and basilar interstitial changes, likely developing basilar edema. -- infectious etiology unlikely.   EKG  reviewed with no acute changes from previous.  Given patient is not experiencing chest pain, EKG with no acute changes, and troponin at baseline, cardiac etiology unlikely.   4:25 PM - Patient re-evaluated and is 98-100% on RA. Still feels mildly short of breath. No chest pain.  Patient ambulated in ED and O2 remained 93-94% on RA.   5:01 PM - All labs and imaging again reviewed. Patient re-evaluated and breathing at baseline. Nephrology, Dr. Jimmy Footman, consulted who recommends giving 50g Kayexalate for patient to take when she gets home tonight. Patient feels comfortable going home. Strict return precautions were given and patient verbalized understanding and agreement with plan. Stressed the importance of going to dialysis on Monday and again stressed the importance of returning if difficulty breathing worsens or new symptoms develop. All questions answered.   [facility] Zuley Lutter, PA-C 08/26/15 1712  Gareth Morgan, MD 08/28/15 [phone]

## 2015-08-28 DIAGNOSIS — N186 End stage renal disease: Secondary | ICD-10-CM | POA: Diagnosis not present

## 2015-08-28 DIAGNOSIS — D509 Iron deficiency anemia, unspecified: Secondary | ICD-10-CM | POA: Diagnosis not present

## 2015-08-28 DIAGNOSIS — E1129 Type 2 diabetes mellitus with other diabetic kidney complication: Secondary | ICD-10-CM | POA: Diagnosis not present

## 2015-08-28 DIAGNOSIS — N2581 Secondary hyperparathyroidism of renal origin: Secondary | ICD-10-CM | POA: Diagnosis not present

## 2015-08-28 DIAGNOSIS — D631 Anemia in chronic kidney disease: Secondary | ICD-10-CM | POA: Diagnosis not present

## 2015-08-30 DIAGNOSIS — D509 Iron deficiency anemia, unspecified: Secondary | ICD-10-CM | POA: Diagnosis not present

## 2015-08-30 DIAGNOSIS — N2581 Secondary hyperparathyroidism of renal origin: Secondary | ICD-10-CM | POA: Diagnosis not present

## 2015-08-30 DIAGNOSIS — E1129 Type 2 diabetes mellitus with other diabetic kidney complication: Secondary | ICD-10-CM | POA: Diagnosis not present

## 2015-08-30 DIAGNOSIS — N186 End stage renal disease: Secondary | ICD-10-CM | POA: Diagnosis not present

## 2015-08-30 DIAGNOSIS — D631 Anemia in chronic kidney disease: Secondary | ICD-10-CM | POA: Diagnosis not present

## 2015-08-31 DIAGNOSIS — M542 Cervicalgia: Secondary | ICD-10-CM | POA: Diagnosis not present

## 2015-09-01 DIAGNOSIS — N2581 Secondary hyperparathyroidism of renal origin: Secondary | ICD-10-CM | POA: Diagnosis not present

## 2015-09-01 DIAGNOSIS — D509 Iron deficiency anemia, unspecified: Secondary | ICD-10-CM | POA: Diagnosis not present

## 2015-09-01 DIAGNOSIS — D631 Anemia in chronic kidney disease: Secondary | ICD-10-CM | POA: Diagnosis not present

## 2015-09-01 DIAGNOSIS — E1129 Type 2 diabetes mellitus with other diabetic kidney complication: Secondary | ICD-10-CM | POA: Diagnosis not present

## 2015-09-01 DIAGNOSIS — N186 End stage renal disease: Secondary | ICD-10-CM | POA: Diagnosis not present

## 2015-09-04 DIAGNOSIS — D631 Anemia in chronic kidney disease: Secondary | ICD-10-CM | POA: Diagnosis not present

## 2015-09-04 DIAGNOSIS — D509 Iron deficiency anemia, unspecified: Secondary | ICD-10-CM | POA: Diagnosis not present

## 2015-09-04 DIAGNOSIS — N186 End stage renal disease: Secondary | ICD-10-CM | POA: Diagnosis not present

## 2015-09-04 DIAGNOSIS — N2581 Secondary hyperparathyroidism of renal origin: Secondary | ICD-10-CM | POA: Diagnosis not present

## 2015-09-04 DIAGNOSIS — E1129 Type 2 diabetes mellitus with other diabetic kidney complication: Secondary | ICD-10-CM | POA: Diagnosis not present

## 2015-09-06 DIAGNOSIS — N2581 Secondary hyperparathyroidism of renal origin: Secondary | ICD-10-CM | POA: Diagnosis not present

## 2015-09-06 DIAGNOSIS — E1129 Type 2 diabetes mellitus with other diabetic kidney complication: Secondary | ICD-10-CM | POA: Diagnosis not present

## 2015-09-06 DIAGNOSIS — D509 Iron deficiency anemia, unspecified: Secondary | ICD-10-CM | POA: Diagnosis not present

## 2015-09-06 DIAGNOSIS — N186 End stage renal disease: Secondary | ICD-10-CM | POA: Diagnosis not present

## 2015-09-06 DIAGNOSIS — D631 Anemia in chronic kidney disease: Secondary | ICD-10-CM | POA: Diagnosis not present

## 2015-09-08 DIAGNOSIS — D631 Anemia in chronic kidney disease: Secondary | ICD-10-CM | POA: Diagnosis not present

## 2015-09-08 DIAGNOSIS — N186 End stage renal disease: Secondary | ICD-10-CM | POA: Diagnosis not present

## 2015-09-08 DIAGNOSIS — D509 Iron deficiency anemia, unspecified: Secondary | ICD-10-CM | POA: Diagnosis not present

## 2015-09-08 DIAGNOSIS — E1129 Type 2 diabetes mellitus with other diabetic kidney complication: Secondary | ICD-10-CM | POA: Diagnosis not present

## 2015-09-08 DIAGNOSIS — N2581 Secondary hyperparathyroidism of renal origin: Secondary | ICD-10-CM | POA: Diagnosis not present

## 2015-09-11 DIAGNOSIS — E1129 Type 2 diabetes mellitus with other diabetic kidney complication: Secondary | ICD-10-CM | POA: Diagnosis not present

## 2015-09-11 DIAGNOSIS — N2581 Secondary hyperparathyroidism of renal origin: Secondary | ICD-10-CM | POA: Diagnosis not present

## 2015-09-11 DIAGNOSIS — D631 Anemia in chronic kidney disease: Secondary | ICD-10-CM | POA: Diagnosis not present

## 2015-09-11 DIAGNOSIS — N186 End stage renal disease: Secondary | ICD-10-CM | POA: Diagnosis not present

## 2015-09-11 DIAGNOSIS — D509 Iron deficiency anemia, unspecified: Secondary | ICD-10-CM | POA: Diagnosis not present

## 2015-09-13 DIAGNOSIS — N2581 Secondary hyperparathyroidism of renal origin: Secondary | ICD-10-CM | POA: Diagnosis not present

## 2015-09-13 DIAGNOSIS — D631 Anemia in chronic kidney disease: Secondary | ICD-10-CM | POA: Diagnosis not present

## 2015-09-13 DIAGNOSIS — D509 Iron deficiency anemia, unspecified: Secondary | ICD-10-CM | POA: Diagnosis not present

## 2015-09-13 DIAGNOSIS — N186 End stage renal disease: Secondary | ICD-10-CM | POA: Diagnosis not present

## 2015-09-13 DIAGNOSIS — E1129 Type 2 diabetes mellitus with other diabetic kidney complication: Secondary | ICD-10-CM | POA: Diagnosis not present

## 2015-09-14 DIAGNOSIS — M542 Cervicalgia: Secondary | ICD-10-CM | POA: Diagnosis not present

## 2015-09-15 DIAGNOSIS — N186 End stage renal disease: Secondary | ICD-10-CM | POA: Diagnosis not present

## 2015-09-15 DIAGNOSIS — D631 Anemia in chronic kidney disease: Secondary | ICD-10-CM | POA: Diagnosis not present

## 2015-09-15 DIAGNOSIS — E1129 Type 2 diabetes mellitus with other diabetic kidney complication: Secondary | ICD-10-CM | POA: Diagnosis not present

## 2015-09-15 DIAGNOSIS — N2581 Secondary hyperparathyroidism of renal origin: Secondary | ICD-10-CM | POA: Diagnosis not present

## 2015-09-15 DIAGNOSIS — D509 Iron deficiency anemia, unspecified: Secondary | ICD-10-CM | POA: Diagnosis not present

## 2015-09-18 DIAGNOSIS — E1129 Type 2 diabetes mellitus with other diabetic kidney complication: Secondary | ICD-10-CM | POA: Diagnosis not present

## 2015-09-18 DIAGNOSIS — N186 End stage renal disease: Secondary | ICD-10-CM | POA: Diagnosis not present

## 2015-09-18 DIAGNOSIS — D631 Anemia in chronic kidney disease: Secondary | ICD-10-CM | POA: Diagnosis not present

## 2015-09-18 DIAGNOSIS — N2581 Secondary hyperparathyroidism of renal origin: Secondary | ICD-10-CM | POA: Diagnosis not present

## 2015-09-18 DIAGNOSIS — D509 Iron deficiency anemia, unspecified: Secondary | ICD-10-CM | POA: Diagnosis not present

## 2015-09-19 DIAGNOSIS — I739 Peripheral vascular disease, unspecified: Secondary | ICD-10-CM | POA: Diagnosis not present

## 2015-09-19 DIAGNOSIS — I2510 Atherosclerotic heart disease of native coronary artery without angina pectoris: Secondary | ICD-10-CM | POA: Diagnosis not present

## 2015-09-19 DIAGNOSIS — N186 End stage renal disease: Secondary | ICD-10-CM | POA: Diagnosis not present

## 2015-09-19 DIAGNOSIS — I5032 Chronic diastolic (congestive) heart failure: Secondary | ICD-10-CM | POA: Diagnosis not present

## 2015-09-20 DIAGNOSIS — N2581 Secondary hyperparathyroidism of renal origin: Secondary | ICD-10-CM | POA: Diagnosis not present

## 2015-09-20 DIAGNOSIS — E1129 Type 2 diabetes mellitus with other diabetic kidney complication: Secondary | ICD-10-CM | POA: Diagnosis not present

## 2015-09-20 DIAGNOSIS — N186 End stage renal disease: Secondary | ICD-10-CM | POA: Diagnosis not present

## 2015-09-20 DIAGNOSIS — D631 Anemia in chronic kidney disease: Secondary | ICD-10-CM | POA: Diagnosis not present

## 2015-09-20 DIAGNOSIS — D509 Iron deficiency anemia, unspecified: Secondary | ICD-10-CM | POA: Diagnosis not present

## 2015-09-22 ENCOUNTER — Other Ambulatory Visit: Payer: Self-pay | Admitting: Sports Medicine

## 2015-09-22 DIAGNOSIS — M25512 Pain in left shoulder: Secondary | ICD-10-CM

## 2015-09-22 DIAGNOSIS — D631 Anemia in chronic kidney disease: Secondary | ICD-10-CM | POA: Diagnosis not present

## 2015-09-22 DIAGNOSIS — N2581 Secondary hyperparathyroidism of renal origin: Secondary | ICD-10-CM | POA: Diagnosis not present

## 2015-09-22 DIAGNOSIS — D509 Iron deficiency anemia, unspecified: Secondary | ICD-10-CM | POA: Diagnosis not present

## 2015-09-22 DIAGNOSIS — N186 End stage renal disease: Secondary | ICD-10-CM | POA: Diagnosis not present

## 2015-09-22 DIAGNOSIS — E1129 Type 2 diabetes mellitus with other diabetic kidney complication: Secondary | ICD-10-CM | POA: Diagnosis not present

## 2015-09-24 DIAGNOSIS — Z992 Dependence on renal dialysis: Secondary | ICD-10-CM | POA: Diagnosis not present

## 2015-09-24 DIAGNOSIS — N186 End stage renal disease: Secondary | ICD-10-CM | POA: Diagnosis not present

## 2015-09-24 DIAGNOSIS — E1129 Type 2 diabetes mellitus with other diabetic kidney complication: Secondary | ICD-10-CM | POA: Diagnosis not present

## 2015-09-25 DIAGNOSIS — E1129 Type 2 diabetes mellitus with other diabetic kidney complication: Secondary | ICD-10-CM | POA: Diagnosis not present

## 2015-09-25 DIAGNOSIS — D509 Iron deficiency anemia, unspecified: Secondary | ICD-10-CM | POA: Diagnosis not present

## 2015-09-25 DIAGNOSIS — N2581 Secondary hyperparathyroidism of renal origin: Secondary | ICD-10-CM | POA: Diagnosis not present

## 2015-09-25 DIAGNOSIS — N186 End stage renal disease: Secondary | ICD-10-CM | POA: Diagnosis not present

## 2015-09-25 DIAGNOSIS — D631 Anemia in chronic kidney disease: Secondary | ICD-10-CM | POA: Diagnosis not present

## 2015-09-27 DIAGNOSIS — N186 End stage renal disease: Secondary | ICD-10-CM | POA: Diagnosis not present

## 2015-09-27 DIAGNOSIS — D509 Iron deficiency anemia, unspecified: Secondary | ICD-10-CM | POA: Diagnosis not present

## 2015-09-27 DIAGNOSIS — N2581 Secondary hyperparathyroidism of renal origin: Secondary | ICD-10-CM | POA: Diagnosis not present

## 2015-09-27 DIAGNOSIS — D631 Anemia in chronic kidney disease: Secondary | ICD-10-CM | POA: Diagnosis not present

## 2015-09-27 DIAGNOSIS — E1129 Type 2 diabetes mellitus with other diabetic kidney complication: Secondary | ICD-10-CM | POA: Diagnosis not present

## 2015-09-29 DIAGNOSIS — D509 Iron deficiency anemia, unspecified: Secondary | ICD-10-CM | POA: Diagnosis not present

## 2015-09-29 DIAGNOSIS — N186 End stage renal disease: Secondary | ICD-10-CM | POA: Diagnosis not present

## 2015-09-29 DIAGNOSIS — N2581 Secondary hyperparathyroidism of renal origin: Secondary | ICD-10-CM | POA: Diagnosis not present

## 2015-10-02 DIAGNOSIS — N186 End stage renal disease: Secondary | ICD-10-CM | POA: Diagnosis not present

## 2015-10-02 DIAGNOSIS — N2581 Secondary hyperparathyroidism of renal origin: Secondary | ICD-10-CM | POA: Diagnosis not present

## 2015-10-02 DIAGNOSIS — D509 Iron deficiency anemia, unspecified: Secondary | ICD-10-CM | POA: Diagnosis not present

## 2015-10-04 ENCOUNTER — Other Ambulatory Visit: Payer: Medicare Other

## 2015-10-04 DIAGNOSIS — D631 Anemia in chronic kidney disease: Secondary | ICD-10-CM | POA: Diagnosis not present

## 2015-10-04 DIAGNOSIS — D509 Iron deficiency anemia, unspecified: Secondary | ICD-10-CM | POA: Diagnosis not present

## 2015-10-04 DIAGNOSIS — N186 End stage renal disease: Secondary | ICD-10-CM | POA: Diagnosis not present

## 2015-10-04 DIAGNOSIS — N2581 Secondary hyperparathyroidism of renal origin: Secondary | ICD-10-CM | POA: Diagnosis not present

## 2015-10-04 DIAGNOSIS — S82851D Displaced trimalleolar fracture of right lower leg, subsequent encounter for closed fracture with routine healing: Secondary | ICD-10-CM | POA: Diagnosis not present

## 2015-10-04 DIAGNOSIS — E1129 Type 2 diabetes mellitus with other diabetic kidney complication: Secondary | ICD-10-CM | POA: Diagnosis not present

## 2015-10-05 ENCOUNTER — Other Ambulatory Visit: Payer: Medicare Other

## 2015-10-06 DIAGNOSIS — D631 Anemia in chronic kidney disease: Secondary | ICD-10-CM | POA: Diagnosis not present

## 2015-10-06 DIAGNOSIS — N2581 Secondary hyperparathyroidism of renal origin: Secondary | ICD-10-CM | POA: Diagnosis not present

## 2015-10-06 DIAGNOSIS — D509 Iron deficiency anemia, unspecified: Secondary | ICD-10-CM | POA: Diagnosis not present

## 2015-10-06 DIAGNOSIS — E1129 Type 2 diabetes mellitus with other diabetic kidney complication: Secondary | ICD-10-CM | POA: Diagnosis not present

## 2015-10-06 DIAGNOSIS — N186 End stage renal disease: Secondary | ICD-10-CM | POA: Diagnosis not present

## 2015-10-09 DIAGNOSIS — N2581 Secondary hyperparathyroidism of renal origin: Secondary | ICD-10-CM | POA: Diagnosis not present

## 2015-10-09 DIAGNOSIS — D631 Anemia in chronic kidney disease: Secondary | ICD-10-CM | POA: Diagnosis not present

## 2015-10-09 DIAGNOSIS — N186 End stage renal disease: Secondary | ICD-10-CM | POA: Diagnosis not present

## 2015-10-09 DIAGNOSIS — D509 Iron deficiency anemia, unspecified: Secondary | ICD-10-CM | POA: Diagnosis not present

## 2015-10-09 DIAGNOSIS — E1129 Type 2 diabetes mellitus with other diabetic kidney complication: Secondary | ICD-10-CM | POA: Diagnosis not present

## 2015-10-10 ENCOUNTER — Other Ambulatory Visit: Payer: Self-pay | Admitting: Orthopedic Surgery

## 2015-10-10 DIAGNOSIS — M25511 Pain in right shoulder: Secondary | ICD-10-CM

## 2015-10-11 DIAGNOSIS — E1129 Type 2 diabetes mellitus with other diabetic kidney complication: Secondary | ICD-10-CM | POA: Diagnosis not present

## 2015-10-11 DIAGNOSIS — N186 End stage renal disease: Secondary | ICD-10-CM | POA: Diagnosis not present

## 2015-10-11 DIAGNOSIS — N2581 Secondary hyperparathyroidism of renal origin: Secondary | ICD-10-CM | POA: Diagnosis not present

## 2015-10-11 DIAGNOSIS — D631 Anemia in chronic kidney disease: Secondary | ICD-10-CM | POA: Diagnosis not present

## 2015-10-11 DIAGNOSIS — D509 Iron deficiency anemia, unspecified: Secondary | ICD-10-CM | POA: Diagnosis not present

## 2015-10-12 ENCOUNTER — Ambulatory Visit
Admission: RE | Admit: 2015-10-12 | Discharge: 2015-10-12 | Disposition: A | Discharge status: Home / Self Care | Payer: Medicare Other | Source: Ambulatory Visit | Attending: Sports Medicine | Admitting: Sports Medicine

## 2015-10-12 DIAGNOSIS — M25512 Pain in left shoulder: Secondary | ICD-10-CM | POA: Diagnosis not present

## 2015-10-12 DIAGNOSIS — M7582 Other shoulder lesions, left shoulder: Secondary | ICD-10-CM | POA: Diagnosis not present

## 2015-10-12 MED ORDER — IOPAMIDOL (ISOVUE-M 200) INJECTION 41%
12.0000 mL | Freq: Once | INTRAMUSCULAR | Status: AC
  Administered 2015-10-12: 12 mL via INTRA_ARTICULAR

## 2015-10-13 DIAGNOSIS — N186 End stage renal disease: Secondary | ICD-10-CM | POA: Diagnosis not present

## 2015-10-13 DIAGNOSIS — D631 Anemia in chronic kidney disease: Secondary | ICD-10-CM | POA: Diagnosis not present

## 2015-10-13 DIAGNOSIS — E1129 Type 2 diabetes mellitus with other diabetic kidney complication: Secondary | ICD-10-CM | POA: Diagnosis not present

## 2015-10-13 DIAGNOSIS — D509 Iron deficiency anemia, unspecified: Secondary | ICD-10-CM | POA: Diagnosis not present

## 2015-10-13 DIAGNOSIS — N2581 Secondary hyperparathyroidism of renal origin: Secondary | ICD-10-CM | POA: Diagnosis not present

## 2015-10-16 DIAGNOSIS — E1129 Type 2 diabetes mellitus with other diabetic kidney complication: Secondary | ICD-10-CM | POA: Diagnosis not present

## 2015-10-16 DIAGNOSIS — N186 End stage renal disease: Secondary | ICD-10-CM | POA: Diagnosis not present

## 2015-10-16 DIAGNOSIS — D631 Anemia in chronic kidney disease: Secondary | ICD-10-CM | POA: Diagnosis not present

## 2015-10-16 DIAGNOSIS — N2581 Secondary hyperparathyroidism of renal origin: Secondary | ICD-10-CM | POA: Diagnosis not present

## 2015-10-16 DIAGNOSIS — D509 Iron deficiency anemia, unspecified: Secondary | ICD-10-CM | POA: Diagnosis not present

## 2015-10-18 DIAGNOSIS — D631 Anemia in chronic kidney disease: Secondary | ICD-10-CM | POA: Diagnosis not present

## 2015-10-18 DIAGNOSIS — E1129 Type 2 diabetes mellitus with other diabetic kidney complication: Secondary | ICD-10-CM | POA: Diagnosis not present

## 2015-10-18 DIAGNOSIS — N186 End stage renal disease: Secondary | ICD-10-CM | POA: Diagnosis not present

## 2015-10-18 DIAGNOSIS — D509 Iron deficiency anemia, unspecified: Secondary | ICD-10-CM | POA: Diagnosis not present

## 2015-10-18 DIAGNOSIS — N2581 Secondary hyperparathyroidism of renal origin: Secondary | ICD-10-CM | POA: Diagnosis not present

## 2015-10-19 ENCOUNTER — Ambulatory Visit
Admission: RE | Admit: 2015-10-19 | Discharge: 2015-10-19 | Disposition: A | Discharge status: Home / Self Care | Payer: Medicare Other | Source: Ambulatory Visit | Attending: Orthopedic Surgery | Admitting: Orthopedic Surgery

## 2015-10-19 DIAGNOSIS — M25511 Pain in right shoulder: Secondary | ICD-10-CM | POA: Diagnosis not present

## 2015-10-19 MED ORDER — IOPAMIDOL (ISOVUE-M 200) INJECTION 41%
15.0000 mL | Freq: Once | INTRAMUSCULAR | Status: AC
  Administered 2015-10-19: 15 mL via INTRA_ARTICULAR

## 2015-10-20 DIAGNOSIS — D631 Anemia in chronic kidney disease: Secondary | ICD-10-CM | POA: Diagnosis not present

## 2015-10-20 DIAGNOSIS — E1129 Type 2 diabetes mellitus with other diabetic kidney complication: Secondary | ICD-10-CM | POA: Diagnosis not present

## 2015-10-20 DIAGNOSIS — D509 Iron deficiency anemia, unspecified: Secondary | ICD-10-CM | POA: Diagnosis not present

## 2015-10-20 DIAGNOSIS — N2581 Secondary hyperparathyroidism of renal origin: Secondary | ICD-10-CM | POA: Diagnosis not present

## 2015-10-20 DIAGNOSIS — N186 End stage renal disease: Secondary | ICD-10-CM | POA: Diagnosis not present

## 2015-10-23 DIAGNOSIS — E1129 Type 2 diabetes mellitus with other diabetic kidney complication: Secondary | ICD-10-CM | POA: Diagnosis not present

## 2015-10-23 DIAGNOSIS — N186 End stage renal disease: Secondary | ICD-10-CM | POA: Diagnosis not present

## 2015-10-23 DIAGNOSIS — D509 Iron deficiency anemia, unspecified: Secondary | ICD-10-CM | POA: Diagnosis not present

## 2015-10-23 DIAGNOSIS — N2581 Secondary hyperparathyroidism of renal origin: Secondary | ICD-10-CM | POA: Diagnosis not present

## 2015-10-23 DIAGNOSIS — D631 Anemia in chronic kidney disease: Secondary | ICD-10-CM | POA: Diagnosis not present

## 2015-10-25 DIAGNOSIS — M542 Cervicalgia: Secondary | ICD-10-CM | POA: Diagnosis not present

## 2015-10-25 DIAGNOSIS — D631 Anemia in chronic kidney disease: Secondary | ICD-10-CM | POA: Diagnosis not present

## 2015-10-25 DIAGNOSIS — D509 Iron deficiency anemia, unspecified: Secondary | ICD-10-CM | POA: Diagnosis not present

## 2015-10-25 DIAGNOSIS — M545 Low back pain: Secondary | ICD-10-CM | POA: Diagnosis not present

## 2015-10-25 DIAGNOSIS — M546 Pain in thoracic spine: Secondary | ICD-10-CM | POA: Diagnosis not present

## 2015-10-25 DIAGNOSIS — N186 End stage renal disease: Secondary | ICD-10-CM | POA: Diagnosis not present

## 2015-10-25 DIAGNOSIS — M25511 Pain in right shoulder: Secondary | ICD-10-CM | POA: Diagnosis not present

## 2015-10-25 DIAGNOSIS — E1129 Type 2 diabetes mellitus with other diabetic kidney complication: Secondary | ICD-10-CM | POA: Diagnosis not present

## 2015-10-25 DIAGNOSIS — N2581 Secondary hyperparathyroidism of renal origin: Secondary | ICD-10-CM | POA: Diagnosis not present

## 2015-10-25 DIAGNOSIS — Z992 Dependence on renal dialysis: Secondary | ICD-10-CM | POA: Diagnosis not present

## 2015-10-26 ENCOUNTER — Emergency Department (HOSPITAL_COMMUNITY)
Admission: EM | Admit: 2015-10-26 | Discharge: 2015-10-26 | Disposition: A | Discharge status: Home / Self Care | Payer: Medicare Other | Attending: Emergency Medicine | Admitting: Emergency Medicine

## 2015-10-26 ENCOUNTER — Other Ambulatory Visit: Payer: Self-pay | Admitting: Orthopedic Surgery

## 2015-10-26 ENCOUNTER — Encounter (HOSPITAL_COMMUNITY): Payer: Self-pay | Admitting: Emergency Medicine

## 2015-10-26 DIAGNOSIS — Z79899 Other long term (current) drug therapy: Secondary | ICD-10-CM | POA: Diagnosis not present

## 2015-10-26 DIAGNOSIS — E11610 Type 2 diabetes mellitus with diabetic neuropathic arthropathy: Secondary | ICD-10-CM | POA: Insufficient documentation

## 2015-10-26 DIAGNOSIS — I120 Hypertensive chronic kidney disease with stage 5 chronic kidney disease or end stage renal disease: Secondary | ICD-10-CM | POA: Diagnosis not present

## 2015-10-26 DIAGNOSIS — Z7982 Long term (current) use of aspirin: Secondary | ICD-10-CM | POA: Diagnosis not present

## 2015-10-26 DIAGNOSIS — N186 End stage renal disease: Secondary | ICD-10-CM | POA: Diagnosis not present

## 2015-10-26 DIAGNOSIS — Z87891 Personal history of nicotine dependence: Secondary | ICD-10-CM | POA: Insufficient documentation

## 2015-10-26 DIAGNOSIS — G8929 Other chronic pain: Secondary | ICD-10-CM | POA: Insufficient documentation

## 2015-10-26 DIAGNOSIS — M545 Low back pain, unspecified: Secondary | ICD-10-CM

## 2015-10-26 DIAGNOSIS — Z794 Long term (current) use of insulin: Secondary | ICD-10-CM | POA: Insufficient documentation

## 2015-10-26 DIAGNOSIS — M549 Dorsalgia, unspecified: Secondary | ICD-10-CM | POA: Diagnosis present

## 2015-10-26 MED ORDER — OXYCODONE-ACETAMINOPHEN 5-325 MG PO TABS
1.0000 | ORAL_TABLET | Freq: Once | ORAL | Status: AC
  Administered 2015-10-26: 1 via ORAL
  Filled 2015-10-26: qty 1

## 2015-10-26 MED ORDER — CYCLOBENZAPRINE HCL 10 MG PO TABS: 10.0000 mg | ORAL_TABLET | Freq: Two times a day (BID) | ORAL | Status: DC | PRN

## 2015-10-26 MED ORDER — OXYCODONE-ACETAMINOPHEN 5-325 MG PO TABS: 2.0000 | ORAL_TABLET | ORAL | Status: DC | PRN

## 2015-10-26 NOTE — ED Notes (Signed)
PA at bedside.

## 2015-10-26 NOTE — ED Notes (Signed)
Patient presents for right mid to lower back pain radiating to left lower back and left leg x1-2 months. Denies injury to same, no numbness/tingling, no loss of bladder or bowel. Ambulatory to triage room with steady gait. No relief with OTC medications.

## 2015-10-26 NOTE — ED Provider Notes (Signed)
CSN: [id number]     Arrival date & time 10/26/15  2140 History   First MD Initiated Contact with Patient 10/26/15 2216     Chief Complaint  Patient presents with  . Back Pain  . Leg Pain     (Consider location/radiation/quality/duration/timing/severity/associated sxs/prior Treatment) HPI  [name] is a 40 y.o F with a pmhx of DM, HTN, ESRD on dialysis, CAD, chronic back pani who presents to the ED today c/o back pain. Pt states she has had back pain similar to this off an on for years. This pain has been worsening over the last 2 months. Pain is located in her bilateral lower back and is now in her left hip/leg. Pt states that 1 week ago the pain significantly worsened and today the pain traveled to her left hip. Pt states pain is worsened with lying down and improved with walking. She has tried taking home ibuprofen and using icy hot with no relief. She denies chest pain, SOB, abdominal pain, saddle anesthesia, bowel/bladder incontinence. Pt has occasional tingling in feet but states that she "gets that with her diabetes."    Past Medical History  Diagnosis Date  . DM (diabetes mellitus) ([place])     Long-term insulin  . Hypertension   . Tobacco use disorder     Discontinued March 2012  . Hyperlipidemia     Hypertriglyceridemia 449 HDL 25  . Diabetic Charcot's joint disease ([place])   . Coronary artery disease     Status post cardiac catheterization June 2012 scattered coronary artery disease/atherosclerosis with 70-80% stenosis in a small right PDA.  . Gastroparesis   . Peripheral vascular disease ([place])     Tibial occlusive disease evaluated by Dr. Kellie Simmering in August 2011. Medical therapy  . 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 CIN III (cervical intraepithelial neoplasia grade III) with severe dysplasia     S/P LEEP AND [facility]E  . Chronic anemia     2nd to renal disease  . End stage renal disease on dialysis ([place]) 05/02/11    [facility]; M; W, F; last time 05/01/11  . Migraines     "just on  dialysis days"  . Peripheral neuropathy (HCC)     related to DM  . History of abscesses in groins 12/06/2010  . Renal insufficiency     Dialysis since 2012  . Fracture of 5th metatarsal 2016    Right  . Trimalleolar fracture of ankle, closed 02/09/2015    Right   Past Surgical History  Procedure Laterality Date  . Av fistula placement  04/2010  . Dilation and curettage of uterus  2009  . Cervical biopsy  w/ loop electrode excision      h/o  . Cervical cone biopsy      h/o  . Colposcopy    . Cardiac catheterization [name] 02/10/2015    Procedure: Left Heart Cath and Coronary Angiography;  Surgeon: Dixie Dials, MD;  Location: [facility];  Service: Cardiovascular;  Laterality: [name];  . Orif ankle fracture Right 02/09/2015  . Orif ankle fracture Right 02/16/2015    Procedure: OPEN REDUCTION INTERNAL FIXATION (ORIF) RIGHT ANKLE FRACTURE;  Surgeon: Altamese Winona, MD;  Location: [facility];  Service: Orthopedics;  Laterality: Right;   Family History  Problem Relation Age of Onset  . Diabetes Mother   . Hypertension Mother   . Diabetes Father   . Hyperlipidemia Father   . Hypertension Father   . Diabetes Sister   . Breast cancer  Maternal Aunt     Age 47's  . Stroke Maternal Grandmother   . Diabetes Sister    Social History  Substance Use Topics  . Smoking status: Former Smoker -- 0.30 packs/day for 10 years    Types: Cigarettes    Quit date: 08/05/2011  . Smokeless tobacco: Never Used     Comment: "quit smoking cigarettes 07/2010"  . Alcohol Use: No   OB History    Gravida Para Term Preterm AB TAB SAB Ectopic Multiple Living   2 1 1  1     1      Review of Systems  All other systems reviewed and are negative.     Allergies  Cephalexin and Sulfamethoxazole-trimethoprim  Home Medications   Prior to Admission medications   Medication Sig Start Date End Date Taking? Authorizing Provider  acetaminophen (TYLENOL) 500 MG tablet Take 500 mg by mouth every 6 (six) hours as  needed for mild pain or moderate pain.    Historical Provider, MD  amLODipine (NORVASC) 10 MG tablet Take 1 tablet (10 mg total) by mouth daily. Patient taking differently: Take 10 mg by mouth at bedtime.  07/05/13   Minus Breeding, MD  aspirin EC 81 MG EC tablet Take 1 tablet (81 mg total) by mouth daily. Patient not taking: Reported on 03/04/2015 02/18/15   Delfina Redwood, MD  atorvastatin (LIPITOR) 40 MG tablet Take 40 mg by mouth daily.    Historical Provider, MD  cinacalcet (SENSIPAR) 30 MG tablet Take 30 mg by mouth daily.    Historical Provider, MD  cloNIDine (CATAPRES) 0.3 MG tablet Take 1 tablet (0.3 mg total) by mouth 3 (three) times daily. 11/01/14   Debbe Odea, MD  clopidogrel (PLAVIX) 75 MG tablet Take 75 mg by mouth at bedtime.     Historical Provider, MD  gabapentin (NEURONTIN) 600 MG tablet Take 1 tablet (600 mg total) by mouth at bedtime. 02/18/15   Delfina Redwood, MD  HUMALOG MIX 50/50 KWIKPEN (50-50) 100 UNIT/ML Kwikpen Inject 26-28 Units into the skin 2 (two) times daily. 28 in the morning and 26 in the evening. 04/29/14   Historical Provider, MD  hydrALAZINE (APRESOLINE) 50 MG tablet Take 1 tablet (50 mg total) by mouth every 8 (eight) hours. 11/01/14   Debbe Odea, MD  isosorbide mononitrate (IMDUR) 60 MG 24 hr tablet Take 60 mg by mouth daily.    Historical Provider, MD  labetalol (NORMODYNE) 300 MG tablet Take 1 tablet (300 mg total) by mouth 3 (three) times daily. 11/01/14   Debbe Odea, MD  lamoTRIgine (LAMICTAL) 25 MG tablet Take 50 mg by mouth 2 (two) times daily.     Historical Provider, MD  minoxidil (LONITEN) 10 MG tablet Take 10 mg by mouth 2 (two) times daily.    Historical Provider, MD  RENVELA 800 MG tablet Take 1 tablet by mouth 3 (three) times daily with meals.  04/17/13   Historical Provider, MD  sodium polystyrene (KAYEXALATE) powder Take by mouth once. 08/26/15   Jaime Pilcher Ward, PA-C   BP 164/73 mmHg  Pulse 80  Temp(Src) 97 F (36.1 C) (Oral)  Resp 16   Ht 6\' 4"  (1.93 m)  Wt 99.791 kg  BMI 26.79 kg/m2  SpO2 97%  LMP 10/09/2015 Physical Exam  Constitutional: She is oriented to person, place, and time. She appears well-developed and well-nourished. No distress.  HENT:  Head: Normocephalic and atraumatic.  Eyes: Conjunctivae are normal. Right eye exhibits no discharge. Left eye exhibits  no discharge. No scleral icterus.  Cardiovascular: Normal rate and regular rhythm.   Murmur ( 2/6 systolic) heard. Pulmonary/Chest: Effort normal and breath sounds normal.  Abdominal: Soft. Bowel sounds are normal. She exhibits no distension and no mass. There is no tenderness. There is no rebound and no guarding.  No abdominal bruits  Musculoskeletal:  TTP along bilateral lumbar paraspinal muscles. No midline spinal tenderness. FROM of C, T, L spine. No step offs or obvious bony deformities. Negative SLR.   Neurological: She is alert and oriented to person, place, and time. Coordination normal.  Strength 5/5 throughout. No sensory deficits.  No gait abnormality.  Skin: Skin is warm and dry. No rash noted. She is not diaphoretic. No erythema. No pallor.  Psychiatric: She has a normal mood and affect. Her behavior is normal.  Nursing note and vitals reviewed.   ED Course  Procedures (including critical care time) Labs Review Labs Reviewed - No data to display  Imaging Review No results found. I have personally reviewed and evaluated these images and lab results as part of my medical decision-making.   EKG Interpretation None      MDM   Final diagnoses:  Bilateral low back pain without sciatica    MDM Number of Diagnoses or Management Options Bilateral low back pain without sciatica:   Patient with back pain worsening over the last 2 months and acutely in the last week. Pt reports history of similar back pain, intermittently for several years. Suspect exacerbation of chronic pain.  No neurological deficits and normal neuro exam.  Patient  can walk but states is painful.  No loss of bowel or bladder control.  No concern for cauda equina.  No fever, night sweats, weight loss, h/o cancer, IVDU.  RICE protocol and pain medicine indicated and discussed with patient. PT d/c with rx for Flexeril and percocet. Pt will follow up with PCP next week for re-evaluation. STRICT return precautions given and are outlined in pt discharge instructions.     Dondra Spry Boykin, PA-C 10/27/15 0132  Lacretia Leigh, MD 10/29/15 [phone]

## 2015-10-26 NOTE — Discharge Instructions (Signed)
Back Pain, Adult Back pain is very common in adults.The cause of back pain is rarely dangerous and the pain often gets better over time.The cause of your back pain may not be known. Some common causes of back pain include:  Strain of the muscles or ligaments supporting the spine.  Wear and tear (degeneration) of the spinal disks.  Arthritis.  Direct injury to the back. For many people, back pain may return. Since back pain is rarely dangerous, most people can learn to manage this condition on their own. HOME CARE INSTRUCTIONS Watch your back pain for any changes. The following actions may help to lessen any discomfort you are feeling:  Remain active. It is stressful on your back to sit or stand in one place for long periods of time. Do not sit, drive, or stand in one place for more than 30 minutes at a time. Take short walks on even surfaces as soon as you are able.Try to increase the length of time you walk each day.  Exercise regularly as directed by your health care provider. Exercise helps your back heal faster. It also helps avoid future injury by keeping your muscles strong and flexible.  Do not stay in bed.Resting more than 1-2 days can delay your recovery.  Pay attention to your body when you bend and lift. The most comfortable positions are those that put less stress on your recovering back. Always use proper lifting techniques, including:  Bending your knees.  Keeping the load close to your body.  Avoiding twisting.  Find a comfortable position to sleep. Use a firm mattress and lie on your side with your knees slightly bent. If you lie on your back, put a pillow under your knees.  Avoid feeling anxious or stressed.Stress increases muscle tension and can worsen back pain.It is important to recognize when you are anxious or stressed and learn ways to manage it, such as with exercise.  Take medicines only as directed by your health care provider. Over-the-counter  medicines to reduce pain and inflammation are often the most helpful.Your health care provider may prescribe muscle relaxant drugs.These medicines help dull your pain so you can more quickly return to your normal activities and healthy exercise.  Apply ice to the injured area:  Put ice in a plastic bag.  Place a towel between your skin and the bag.  Leave the ice on for 20 minutes, 2-3 times a day for the first 2-3 days. After that, ice and heat may be alternated to reduce pain and spasms.  Maintain a healthy weight. Excess weight puts extra stress on your back and makes it difficult to maintain good posture. SEEK MEDICAL CARE IF:  You have pain that is not relieved with rest or medicine.  You have increasing pain going down into the legs or buttocks.  You have pain that does not improve in one week.  You have night pain.  You lose weight.  You have a fever or chills. SEEK IMMEDIATE MEDICAL CARE IF:   You develop new bowel or bladder control problems.  You have unusual weakness or numbness in your arms or legs.  You develop nausea or vomiting.  You develop abdominal pain.  You feel faint.   This information is not intended to replace advice given to you by your health care provider. Make sure you discuss any questions you have with your health care provider.   Apply ice to affected area. Take pain medication and muscle relaxers as needed. Follow up  with your PCP next week for re-evaluation or if your symptoms do not improve. Return to the ED if you experience severe worsening of your symptoms, loss of control of your bowel or bladder, numbness/tingling in both of your lower extremities, fever, chest pain, shortness of breath, loss of consciousness, vomiting, burning with urination.

## 2015-10-27 DIAGNOSIS — E1129 Type 2 diabetes mellitus with other diabetic kidney complication: Secondary | ICD-10-CM | POA: Diagnosis not present

## 2015-10-27 DIAGNOSIS — D631 Anemia in chronic kidney disease: Secondary | ICD-10-CM | POA: Diagnosis not present

## 2015-10-27 DIAGNOSIS — N186 End stage renal disease: Secondary | ICD-10-CM | POA: Diagnosis not present

## 2015-10-27 DIAGNOSIS — D509 Iron deficiency anemia, unspecified: Secondary | ICD-10-CM | POA: Diagnosis not present

## 2015-10-27 DIAGNOSIS — N2581 Secondary hyperparathyroidism of renal origin: Secondary | ICD-10-CM | POA: Diagnosis not present

## 2015-10-30 DIAGNOSIS — N2581 Secondary hyperparathyroidism of renal origin: Secondary | ICD-10-CM | POA: Diagnosis not present

## 2015-10-30 DIAGNOSIS — N186 End stage renal disease: Secondary | ICD-10-CM | POA: Diagnosis not present

## 2015-10-30 DIAGNOSIS — D509 Iron deficiency anemia, unspecified: Secondary | ICD-10-CM | POA: Diagnosis not present

## 2015-10-30 DIAGNOSIS — E1129 Type 2 diabetes mellitus with other diabetic kidney complication: Secondary | ICD-10-CM | POA: Diagnosis not present

## 2015-10-30 DIAGNOSIS — D631 Anemia in chronic kidney disease: Secondary | ICD-10-CM | POA: Diagnosis not present

## 2015-10-31 DIAGNOSIS — N186 End stage renal disease: Secondary | ICD-10-CM | POA: Diagnosis not present

## 2015-10-31 DIAGNOSIS — I771 Stricture of artery: Secondary | ICD-10-CM | POA: Diagnosis not present

## 2015-10-31 DIAGNOSIS — T82858D Stenosis of vascular prosthetic devices, implants and grafts, subsequent encounter: Secondary | ICD-10-CM | POA: Diagnosis not present

## 2015-10-31 DIAGNOSIS — Z992 Dependence on renal dialysis: Secondary | ICD-10-CM | POA: Diagnosis not present

## 2015-11-01 DIAGNOSIS — D509 Iron deficiency anemia, unspecified: Secondary | ICD-10-CM | POA: Diagnosis not present

## 2015-11-01 DIAGNOSIS — N186 End stage renal disease: Secondary | ICD-10-CM | POA: Diagnosis not present

## 2015-11-01 DIAGNOSIS — N2581 Secondary hyperparathyroidism of renal origin: Secondary | ICD-10-CM | POA: Diagnosis not present

## 2015-11-01 DIAGNOSIS — E1129 Type 2 diabetes mellitus with other diabetic kidney complication: Secondary | ICD-10-CM | POA: Diagnosis not present

## 2015-11-01 DIAGNOSIS — D631 Anemia in chronic kidney disease: Secondary | ICD-10-CM | POA: Diagnosis not present

## 2015-11-02 ENCOUNTER — Ambulatory Visit
Admission: RE | Admit: 2015-11-02 | Discharge: 2015-11-02 | Disposition: A | Discharge status: Home / Self Care | Payer: Medicare Other | Source: Ambulatory Visit | Attending: Orthopedic Surgery | Admitting: Orthopedic Surgery

## 2015-11-02 DIAGNOSIS — M545 Low back pain: Secondary | ICD-10-CM

## 2015-11-02 DIAGNOSIS — M5126 Other intervertebral disc displacement, lumbar region: Secondary | ICD-10-CM | POA: Diagnosis not present

## 2015-11-03 DIAGNOSIS — D509 Iron deficiency anemia, unspecified: Secondary | ICD-10-CM | POA: Diagnosis not present

## 2015-11-03 DIAGNOSIS — E1129 Type 2 diabetes mellitus with other diabetic kidney complication: Secondary | ICD-10-CM | POA: Diagnosis not present

## 2015-11-03 DIAGNOSIS — D631 Anemia in chronic kidney disease: Secondary | ICD-10-CM | POA: Diagnosis not present

## 2015-11-03 DIAGNOSIS — N186 End stage renal disease: Secondary | ICD-10-CM | POA: Diagnosis not present

## 2015-11-03 DIAGNOSIS — N2581 Secondary hyperparathyroidism of renal origin: Secondary | ICD-10-CM | POA: Diagnosis not present

## 2015-11-06 DIAGNOSIS — D509 Iron deficiency anemia, unspecified: Secondary | ICD-10-CM | POA: Diagnosis not present

## 2015-11-06 DIAGNOSIS — D631 Anemia in chronic kidney disease: Secondary | ICD-10-CM | POA: Diagnosis not present

## 2015-11-06 DIAGNOSIS — E1129 Type 2 diabetes mellitus with other diabetic kidney complication: Secondary | ICD-10-CM | POA: Diagnosis not present

## 2015-11-06 DIAGNOSIS — N186 End stage renal disease: Secondary | ICD-10-CM | POA: Diagnosis not present

## 2015-11-06 DIAGNOSIS — N2581 Secondary hyperparathyroidism of renal origin: Secondary | ICD-10-CM | POA: Diagnosis not present

## 2015-11-08 DIAGNOSIS — E1129 Type 2 diabetes mellitus with other diabetic kidney complication: Secondary | ICD-10-CM | POA: Diagnosis not present

## 2015-11-08 DIAGNOSIS — D631 Anemia in chronic kidney disease: Secondary | ICD-10-CM | POA: Diagnosis not present

## 2015-11-08 DIAGNOSIS — D509 Iron deficiency anemia, unspecified: Secondary | ICD-10-CM | POA: Diagnosis not present

## 2015-11-08 DIAGNOSIS — N186 End stage renal disease: Secondary | ICD-10-CM | POA: Diagnosis not present

## 2015-11-08 DIAGNOSIS — N2581 Secondary hyperparathyroidism of renal origin: Secondary | ICD-10-CM | POA: Diagnosis not present

## 2015-11-10 DIAGNOSIS — D509 Iron deficiency anemia, unspecified: Secondary | ICD-10-CM | POA: Diagnosis not present

## 2015-11-10 DIAGNOSIS — D631 Anemia in chronic kidney disease: Secondary | ICD-10-CM | POA: Diagnosis not present

## 2015-11-10 DIAGNOSIS — E1129 Type 2 diabetes mellitus with other diabetic kidney complication: Secondary | ICD-10-CM | POA: Diagnosis not present

## 2015-11-10 DIAGNOSIS — N2581 Secondary hyperparathyroidism of renal origin: Secondary | ICD-10-CM | POA: Diagnosis not present

## 2015-11-10 DIAGNOSIS — M542 Cervicalgia: Secondary | ICD-10-CM | POA: Diagnosis not present

## 2015-11-10 DIAGNOSIS — N186 End stage renal disease: Secondary | ICD-10-CM | POA: Diagnosis not present

## 2015-11-13 DIAGNOSIS — N186 End stage renal disease: Secondary | ICD-10-CM | POA: Diagnosis not present

## 2015-11-13 DIAGNOSIS — N2581 Secondary hyperparathyroidism of renal origin: Secondary | ICD-10-CM | POA: Diagnosis not present

## 2015-11-13 DIAGNOSIS — D509 Iron deficiency anemia, unspecified: Secondary | ICD-10-CM | POA: Diagnosis not present

## 2015-11-13 DIAGNOSIS — D631 Anemia in chronic kidney disease: Secondary | ICD-10-CM | POA: Diagnosis not present

## 2015-11-13 DIAGNOSIS — E1129 Type 2 diabetes mellitus with other diabetic kidney complication: Secondary | ICD-10-CM | POA: Diagnosis not present

## 2015-11-15 DIAGNOSIS — N186 End stage renal disease: Secondary | ICD-10-CM | POA: Diagnosis not present

## 2015-11-15 DIAGNOSIS — D631 Anemia in chronic kidney disease: Secondary | ICD-10-CM | POA: Diagnosis not present

## 2015-11-15 DIAGNOSIS — N2581 Secondary hyperparathyroidism of renal origin: Secondary | ICD-10-CM | POA: Diagnosis not present

## 2015-11-15 DIAGNOSIS — D509 Iron deficiency anemia, unspecified: Secondary | ICD-10-CM | POA: Diagnosis not present

## 2015-11-15 DIAGNOSIS — E1129 Type 2 diabetes mellitus with other diabetic kidney complication: Secondary | ICD-10-CM | POA: Diagnosis not present

## 2015-11-16 DIAGNOSIS — E1021 Type 1 diabetes mellitus with diabetic nephropathy: Secondary | ICD-10-CM | POA: Diagnosis not present

## 2015-11-16 DIAGNOSIS — K429 Umbilical hernia without obstruction or gangrene: Secondary | ICD-10-CM | POA: Diagnosis not present

## 2015-11-16 DIAGNOSIS — E1065 Type 1 diabetes mellitus with hyperglycemia: Secondary | ICD-10-CM | POA: Diagnosis not present

## 2015-11-16 DIAGNOSIS — K439 Ventral hernia without obstruction or gangrene: Secondary | ICD-10-CM | POA: Diagnosis not present

## 2015-11-17 DIAGNOSIS — D509 Iron deficiency anemia, unspecified: Secondary | ICD-10-CM | POA: Diagnosis not present

## 2015-11-17 DIAGNOSIS — N186 End stage renal disease: Secondary | ICD-10-CM | POA: Diagnosis not present

## 2015-11-17 DIAGNOSIS — D631 Anemia in chronic kidney disease: Secondary | ICD-10-CM | POA: Diagnosis not present

## 2015-11-17 DIAGNOSIS — N2581 Secondary hyperparathyroidism of renal origin: Secondary | ICD-10-CM | POA: Diagnosis not present

## 2015-11-17 DIAGNOSIS — E1129 Type 2 diabetes mellitus with other diabetic kidney complication: Secondary | ICD-10-CM | POA: Diagnosis not present

## 2015-11-20 DIAGNOSIS — N186 End stage renal disease: Secondary | ICD-10-CM | POA: Diagnosis not present

## 2015-11-20 DIAGNOSIS — E1129 Type 2 diabetes mellitus with other diabetic kidney complication: Secondary | ICD-10-CM | POA: Diagnosis not present

## 2015-11-20 DIAGNOSIS — N2581 Secondary hyperparathyroidism of renal origin: Secondary | ICD-10-CM | POA: Diagnosis not present

## 2015-11-20 DIAGNOSIS — D509 Iron deficiency anemia, unspecified: Secondary | ICD-10-CM | POA: Diagnosis not present

## 2015-11-20 DIAGNOSIS — D631 Anemia in chronic kidney disease: Secondary | ICD-10-CM | POA: Diagnosis not present

## 2015-11-21 DIAGNOSIS — M5416 Radiculopathy, lumbar region: Secondary | ICD-10-CM | POA: Diagnosis not present

## 2015-11-22 DIAGNOSIS — N2581 Secondary hyperparathyroidism of renal origin: Secondary | ICD-10-CM | POA: Diagnosis not present

## 2015-11-22 DIAGNOSIS — N186 End stage renal disease: Secondary | ICD-10-CM | POA: Diagnosis not present

## 2015-11-22 DIAGNOSIS — D509 Iron deficiency anemia, unspecified: Secondary | ICD-10-CM | POA: Diagnosis not present

## 2015-11-22 DIAGNOSIS — E1129 Type 2 diabetes mellitus with other diabetic kidney complication: Secondary | ICD-10-CM | POA: Diagnosis not present

## 2015-11-22 DIAGNOSIS — D631 Anemia in chronic kidney disease: Secondary | ICD-10-CM | POA: Diagnosis not present

## 2015-11-24 DIAGNOSIS — N186 End stage renal disease: Secondary | ICD-10-CM | POA: Diagnosis not present

## 2015-11-24 DIAGNOSIS — E1129 Type 2 diabetes mellitus with other diabetic kidney complication: Secondary | ICD-10-CM | POA: Diagnosis not present

## 2015-11-24 DIAGNOSIS — D631 Anemia in chronic kidney disease: Secondary | ICD-10-CM | POA: Diagnosis not present

## 2015-11-24 DIAGNOSIS — D509 Iron deficiency anemia, unspecified: Secondary | ICD-10-CM | POA: Diagnosis not present

## 2015-11-24 DIAGNOSIS — N2581 Secondary hyperparathyroidism of renal origin: Secondary | ICD-10-CM | POA: Diagnosis not present

## 2015-11-24 DIAGNOSIS — Z992 Dependence on renal dialysis: Secondary | ICD-10-CM | POA: Diagnosis not present

## 2015-11-27 DIAGNOSIS — D509 Iron deficiency anemia, unspecified: Secondary | ICD-10-CM | POA: Diagnosis not present

## 2015-11-27 DIAGNOSIS — D631 Anemia in chronic kidney disease: Secondary | ICD-10-CM | POA: Diagnosis not present

## 2015-11-27 DIAGNOSIS — N186 End stage renal disease: Secondary | ICD-10-CM | POA: Diagnosis not present

## 2015-11-27 DIAGNOSIS — E1129 Type 2 diabetes mellitus with other diabetic kidney complication: Secondary | ICD-10-CM | POA: Diagnosis not present

## 2015-11-27 DIAGNOSIS — N2581 Secondary hyperparathyroidism of renal origin: Secondary | ICD-10-CM | POA: Diagnosis not present

## 2015-11-29 DIAGNOSIS — D631 Anemia in chronic kidney disease: Secondary | ICD-10-CM | POA: Diagnosis not present

## 2015-11-29 DIAGNOSIS — N186 End stage renal disease: Secondary | ICD-10-CM | POA: Diagnosis not present

## 2015-11-29 DIAGNOSIS — D509 Iron deficiency anemia, unspecified: Secondary | ICD-10-CM | POA: Diagnosis not present

## 2015-11-29 DIAGNOSIS — N2581 Secondary hyperparathyroidism of renal origin: Secondary | ICD-10-CM | POA: Diagnosis not present

## 2015-11-29 DIAGNOSIS — E1129 Type 2 diabetes mellitus with other diabetic kidney complication: Secondary | ICD-10-CM | POA: Diagnosis not present

## 2015-12-01 DIAGNOSIS — N186 End stage renal disease: Secondary | ICD-10-CM | POA: Diagnosis not present

## 2015-12-01 DIAGNOSIS — D509 Iron deficiency anemia, unspecified: Secondary | ICD-10-CM | POA: Diagnosis not present

## 2015-12-01 DIAGNOSIS — N2581 Secondary hyperparathyroidism of renal origin: Secondary | ICD-10-CM | POA: Diagnosis not present

## 2015-12-01 DIAGNOSIS — E1129 Type 2 diabetes mellitus with other diabetic kidney complication: Secondary | ICD-10-CM | POA: Diagnosis not present

## 2015-12-01 DIAGNOSIS — D631 Anemia in chronic kidney disease: Secondary | ICD-10-CM | POA: Diagnosis not present

## 2015-12-04 DIAGNOSIS — E1129 Type 2 diabetes mellitus with other diabetic kidney complication: Secondary | ICD-10-CM | POA: Diagnosis not present

## 2015-12-04 DIAGNOSIS — N186 End stage renal disease: Secondary | ICD-10-CM | POA: Diagnosis not present

## 2015-12-04 DIAGNOSIS — D509 Iron deficiency anemia, unspecified: Secondary | ICD-10-CM | POA: Diagnosis not present

## 2015-12-04 DIAGNOSIS — N2581 Secondary hyperparathyroidism of renal origin: Secondary | ICD-10-CM | POA: Diagnosis not present

## 2015-12-04 DIAGNOSIS — D631 Anemia in chronic kidney disease: Secondary | ICD-10-CM | POA: Diagnosis not present

## 2015-12-06 DIAGNOSIS — N186 End stage renal disease: Secondary | ICD-10-CM | POA: Diagnosis not present

## 2015-12-06 DIAGNOSIS — D631 Anemia in chronic kidney disease: Secondary | ICD-10-CM | POA: Diagnosis not present

## 2015-12-06 DIAGNOSIS — N2581 Secondary hyperparathyroidism of renal origin: Secondary | ICD-10-CM | POA: Diagnosis not present

## 2015-12-06 DIAGNOSIS — D509 Iron deficiency anemia, unspecified: Secondary | ICD-10-CM | POA: Diagnosis not present

## 2015-12-06 DIAGNOSIS — E1129 Type 2 diabetes mellitus with other diabetic kidney complication: Secondary | ICD-10-CM | POA: Diagnosis not present

## 2015-12-08 ENCOUNTER — Emergency Department (HOSPITAL_COMMUNITY)
Admission: EM | Admit: 2015-12-08 | Discharge: 2015-12-08 | Disposition: A | Discharge status: Home / Self Care | Payer: Medicare Other | Attending: Emergency Medicine | Admitting: Emergency Medicine

## 2015-12-08 ENCOUNTER — Encounter (HOSPITAL_COMMUNITY): Payer: Self-pay

## 2015-12-08 DIAGNOSIS — I120 Hypertensive chronic kidney disease with stage 5 chronic kidney disease or end stage renal disease: Secondary | ICD-10-CM | POA: Insufficient documentation

## 2015-12-08 DIAGNOSIS — Z992 Dependence on renal dialysis: Secondary | ICD-10-CM | POA: Diagnosis not present

## 2015-12-08 DIAGNOSIS — I2510 Atherosclerotic heart disease of native coronary artery without angina pectoris: Secondary | ICD-10-CM | POA: Insufficient documentation

## 2015-12-08 DIAGNOSIS — E1122 Type 2 diabetes mellitus with diabetic chronic kidney disease: Secondary | ICD-10-CM | POA: Diagnosis not present

## 2015-12-08 DIAGNOSIS — N186 End stage renal disease: Secondary | ICD-10-CM | POA: Insufficient documentation

## 2015-12-08 DIAGNOSIS — Z79899 Other long term (current) drug therapy: Secondary | ICD-10-CM | POA: Diagnosis not present

## 2015-12-08 DIAGNOSIS — M545 Low back pain, unspecified: Secondary | ICD-10-CM

## 2015-12-08 DIAGNOSIS — D509 Iron deficiency anemia, unspecified: Secondary | ICD-10-CM | POA: Diagnosis not present

## 2015-12-08 DIAGNOSIS — I10 Essential (primary) hypertension: Secondary | ICD-10-CM

## 2015-12-08 DIAGNOSIS — E785 Hyperlipidemia, unspecified: Secondary | ICD-10-CM | POA: Insufficient documentation

## 2015-12-08 DIAGNOSIS — Z87891 Personal history of nicotine dependence: Secondary | ICD-10-CM | POA: Insufficient documentation

## 2015-12-08 DIAGNOSIS — D631 Anemia in chronic kidney disease: Secondary | ICD-10-CM | POA: Diagnosis not present

## 2015-12-08 DIAGNOSIS — N2581 Secondary hyperparathyroidism of renal origin: Secondary | ICD-10-CM | POA: Diagnosis not present

## 2015-12-08 DIAGNOSIS — E1129 Type 2 diabetes mellitus with other diabetic kidney complication: Secondary | ICD-10-CM | POA: Diagnosis not present

## 2015-12-08 MED ORDER — OXYCODONE-ACETAMINOPHEN 5-325 MG PO TABS
2.0000 | ORAL_TABLET | Freq: Once | ORAL | Status: AC
  Administered 2015-12-08: 2 via ORAL
  Filled 2015-12-08: qty 2

## 2015-12-08 MED ORDER — DIAZEPAM 5 MG PO TABS
5.0000 mg | ORAL_TABLET | Freq: Once | ORAL | Status: AC
  Administered 2015-12-08: 5 mg via ORAL
  Filled 2015-12-08: qty 1

## 2015-12-08 MED ORDER — CYCLOBENZAPRINE HCL 5 MG PO TABS: 5.0000 mg | ORAL_TABLET | Freq: Three times a day (TID) | ORAL | Status: DC | PRN

## 2015-12-08 MED ORDER — OXYCODONE-ACETAMINOPHEN 5-325 MG PO TABS: 1.0000 | ORAL_TABLET | Freq: Four times a day (QID) | ORAL | Status: DC | PRN

## 2015-12-08 NOTE — ED Provider Notes (Signed)
CSN: [id number]     Arrival date & time 12/08/15  0122 History  By signing my name below, I, Reola Mosher, attest that this documentation has been prepared under the direction and in the presence of Alfonzo Beers, MD. Electronically Signed: Reola Mosher, ED Scribe. 12/08/2015. 3:36 AM.   Chief Complaint  Patient presents with  . Back Pain    Patient is a 40 y.o. female presenting with back pain. The history is provided by the patient. No language interpreter was used.  Back Pain Location:  Thoracic spine Quality:  Unable to specify Radiates to:  Does not radiate Pain severity:  Severe Pain is:  Same all the time Onset quality:  Gradual Duration:  2 days Timing:  Constant Progression:  Worsening Chronicity:  Chronic Context: not falling, not lifting heavy objects, not MCA, not MVA, not recent injury and not twisting   Relieved by:  Nothing Ineffective treatments:  Ibuprofen, OTC medications, being still, NSAIDs and lying down Associated symptoms: no fever    HPI Comments: [name] is a 40 y.o. female with a PMHx significant for chronic back pain, HTN, DM on insulin, CAD, and end stage renal disease on dialysis, who presents to the Emergency Department complaining of gradual onset, gradually worsening, constant, acute on chronic, 10/10 middle back pain onset 2 days. Her pain does not radiate. No new activities, falls, heavy lifting, or trauma. She has a hx of chronic back pain and has received a steroid injection in the past which she reports relieved her pain for 1-2 days, but it returned. She has taken Tylenol, Naproxen, Aleve, and Ibuprofen PTA, none of which have relieved her pain. She currently takes Sorbide, Hydralazine, Clonidine, and Labetalol for her hx of HTN. She has been compliant with them today. She denies any fever.  No weakness of legs, no urinary retention or incontinence of bowel or bladder (pt does make some urine)  Past Medical History  Diagnosis  Date  . DM (diabetes mellitus) ([place])     Long-term insulin  . Hypertension   . Tobacco use disorder     Discontinued March 2012  . Hyperlipidemia     Hypertriglyceridemia 449 HDL 25  . Diabetic Charcot's joint disease ([place])   . Coronary artery disease     Status post cardiac catheterization June 2012 scattered coronary artery disease/atherosclerosis with 70-80% stenosis in a small right PDA.  . Gastroparesis   . Peripheral vascular disease ([place])     Tibial occlusive disease evaluated by Dr. Kellie Simmering in August 2011. Medical therapy  . 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 CIN III (cervical intraepithelial neoplasia grade III) with severe dysplasia     S/P LEEP AND [facility]  . Chronic anemia     2nd to renal disease  . End stage renal disease on dialysis ([place]) 05/02/11    [facility]; M; W, F; last time 05/01/11  . Migraines     "just on dialysis days"  . Peripheral neuropathy ([facility])     related to DM  . History of abscesses in groins 12/06/2010  . Renal insufficiency     Dialysis since 2012  . Fracture of 5th metatarsal 2016    Right  . Trimalleolar fracture of ankle, closed 02/09/2015    Right   Past Surgical History  Procedure Laterality Date  . Av fistula placement  04/2010  . Dilation and curettage of uterus  2009  . Cervical biopsy  w/ loop electrode excision  h/o  . Cervical cone biopsy      h/o  . Colposcopy    . Cardiac catheterization [name] 02/10/2015    Procedure: Left Heart Cath and Coronary Angiography;  Surgeon: Dixie Dials, MD;  Location: [facility];  Service: Cardiovascular;  Laterality: [name];  . Orif ankle fracture Right 02/09/2015  . Orif ankle fracture Right 02/16/2015    Procedure: OPEN REDUCTION INTERNAL FIXATION (ORIF) RIGHT ANKLE FRACTURE;  Surgeon: Altamese Riverbend, MD;  Location: [facility];  Service: Orthopedics;  Laterality: Right;   Family History  Problem Relation Age of Onset  . Diabetes Mother   . Hypertension Mother   . Diabetes Father   . Hyperlipidemia Father    . Hypertension Father   . Diabetes Sister   . Breast cancer Maternal Aunt     Age 42's  . Stroke Maternal Grandmother   . Diabetes Sister    Social History  Substance Use Topics  . Smoking status: Former Smoker -- 0.30 packs/day for 10 years    Types: Cigarettes    Quit date: 08/05/2011  . Smokeless tobacco: Never Used     Comment: "quit smoking cigarettes 07/2010"  . Alcohol Use: No   OB History    Gravida Para Term Preterm AB TAB SAB Ectopic Multiple Living   2 1 1  1     1      Review of Systems  Constitutional: Negative for fever.  Musculoskeletal: Positive for back pain.  All other systems reviewed and are negative.  Allergies  Cephalexin and Sulfamethoxazole-trimethoprim  Home Medications   Prior to Admission medications   Medication Sig Start Date End Date Taking? Authorizing Provider  acetaminophen (TYLENOL) 500 MG tablet Take 500 mg by mouth every 6 (six) hours as needed for mild pain or moderate pain.   Yes Historical Provider, MD  atorvastatin (LIPITOR) 40 MG tablet Take 40 mg by mouth daily.   Yes Historical Provider, MD  cloNIDine (CATAPRES) 0.3 MG tablet Take 1 tablet (0.3 mg total) by mouth 3 (three) times daily. 11/01/14  Yes Debbe Odea, MD  clopidogrel (PLAVIX) 75 MG tablet Take 75 mg by mouth at bedtime.    Yes Historical Provider, MD  HUMALOG MIX 50/50 KWIKPEN (50-50) 100 UNIT/ML Kwikpen Inject 26-28 Units into the skin 2 (two) times daily. 28 in the morning and 26 in the evening. 04/29/14  Yes Historical Provider, MD  hydrALAZINE (APRESOLINE) 50 MG tablet Take 1 tablet (50 mg total) by mouth every 8 (eight) hours. 11/01/14  Yes Debbe Odea, MD  isosorbide mononitrate (IMDUR) 60 MG 24 hr tablet Take 60 mg by mouth daily.   Yes Historical Provider, MD  labetalol (NORMODYNE) 300 MG tablet Take 1 tablet (300 mg total) by mouth 3 (three) times daily. 11/01/14  Yes Debbe Odea, MD  lamoTRIgine (LAMICTAL) 25 MG tablet Take 50 mg by mouth 2 (two) times daily.    Yes  Historical Provider, MD  minoxidil (LONITEN) 10 MG tablet Take 10 mg by mouth 2 (two) times daily.   Yes Historical Provider, MD  RENVELA 800 MG tablet Take 800 mg by mouth 3 (three) times daily with meals.  04/17/13  Yes Historical Provider, MD  amLODipine (NORVASC) 10 MG tablet Take 1 tablet (10 mg total) by mouth daily. Patient not taking: Reported on 12/08/2015 07/05/13   Minus Breeding, MD  aspirin EC 81 MG EC tablet Take 1 tablet (81 mg total) by mouth daily. Patient not taking: Reported on 03/04/2015 02/18/15  Delfina Redwood, MD  cyclobenzaprine (FLEXERIL) 5 MG tablet Take 1 tablet (5 mg total) by mouth 3 (three) times daily as needed for muscle spasms. 12/08/15   Alfonzo Beers, MD  gabapentin (NEURONTIN) 600 MG tablet Take 1 tablet (600 mg total) by mouth at bedtime. Patient not taking: Reported on 12/08/2015 02/18/15   Delfina Redwood, MD  oxyCODONE-acetaminophen (PERCOCET/ROXICET) 5-325 MG tablet Take 1-2 tablets by mouth every 6 (six) hours as needed for severe pain. 12/08/15   Alfonzo Beers, MD  sodium polystyrene (KAYEXALATE) powder Take by mouth once. Patient not taking: Reported on 12/08/2015 08/26/15   Ozella Almond Ward, PA-C   BP 186/88 mmHg  Pulse 91  Temp(Src) 98.6 F (37 C) (Oral)  Resp 20  Ht 6\' 4"  (1.93 m)  Wt 216 lb (97.977 kg)  BMI 26.30 kg/m2  SpO2 95%  LMP 09/16/2015  Vitals reviewed Physical Exam  Physical Examination: General appearance - alert, uncomfortable appearing, and in no distress Mental status - alert, oriented to person, place, and time Eyes - no conjunctival injection, no scleral icterus Chest - clear to auscultation, no wheezes, rales or rhonchi, symmetric air entry Heart - normal rate, regular rhythm, normal S1, S2, no murmurs, rubs, clicks or gallops Abdomen - soft, nontender, nondistended, no masses or organomegaly Back exam - no midline tenderness, bilateral paraspinal lumbar tenderness to palpation Neurological - alert, oriented, normal  speech, strength 5/5 in lower extremities, sensation intact Musculoskeletal - no joint tenderness, deformity or swelling Extremities - peripheral pulses normal, no pedal edema, no clubbing or cyanosis Skin - normal coloration and turgor, no rashes,  ED Course  Procedures (including critical care time)  DIAGNOSTIC STUDIES: Oxygen Saturation is 96% on RA, normal by my interpretation.   COORDINATION OF CARE: 3:32 AM-Discussed next steps with pt including Oxycodone and Valium. Pt verbalized understanding and is agreeable with the plan.   MDM   Final diagnoses:  Bilateral low back pain without sciatica  Essential hypertension  End stage renal disease on dialysis ([place])    Pt presenting with c/o lower back pain, she states she has had similar pain in the past but it has been worse over the past 2 days.  No new trauma or injury.  No signs or symptoms of cauda equina, no fever to suggest epidural abscess.  Pt is hypertensive- she has no current symptoms, most likely related to her discomfort.  Pt treated with pain meds and muscle relaxer- she has seen some improvement and is requesting discharge so that she can get to her dialysis.  Discharged with strict return precautions.  Pt agreeable with plan.   I personally performed the services described in this documentation, which was scribed in my presence. The recorded information has been reviewed and is accurate.     Alfonzo Beers, MD 12/08/15 [phone]

## 2015-12-08 NOTE — ED Notes (Signed)
Pt complains of chronic back pain that is worse tonight

## 2015-12-08 NOTE — Discharge Instructions (Signed)
Return to the ED with any concerns including weakness of legs, not able to urinate, incontinence of stool or urine, fever, or any other alarming symptoms

## 2015-12-11 DIAGNOSIS — D509 Iron deficiency anemia, unspecified: Secondary | ICD-10-CM | POA: Diagnosis not present

## 2015-12-11 DIAGNOSIS — N2581 Secondary hyperparathyroidism of renal origin: Secondary | ICD-10-CM | POA: Diagnosis not present

## 2015-12-11 DIAGNOSIS — E1129 Type 2 diabetes mellitus with other diabetic kidney complication: Secondary | ICD-10-CM | POA: Diagnosis not present

## 2015-12-11 DIAGNOSIS — N186 End stage renal disease: Secondary | ICD-10-CM | POA: Diagnosis not present

## 2015-12-11 DIAGNOSIS — D631 Anemia in chronic kidney disease: Secondary | ICD-10-CM | POA: Diagnosis not present

## 2015-12-13 DIAGNOSIS — D631 Anemia in chronic kidney disease: Secondary | ICD-10-CM | POA: Diagnosis not present

## 2015-12-13 DIAGNOSIS — D509 Iron deficiency anemia, unspecified: Secondary | ICD-10-CM | POA: Diagnosis not present

## 2015-12-13 DIAGNOSIS — N186 End stage renal disease: Secondary | ICD-10-CM | POA: Diagnosis not present

## 2015-12-13 DIAGNOSIS — N2581 Secondary hyperparathyroidism of renal origin: Secondary | ICD-10-CM | POA: Diagnosis not present

## 2015-12-13 DIAGNOSIS — E1129 Type 2 diabetes mellitus with other diabetic kidney complication: Secondary | ICD-10-CM | POA: Diagnosis not present

## 2015-12-15 DIAGNOSIS — D509 Iron deficiency anemia, unspecified: Secondary | ICD-10-CM | POA: Diagnosis not present

## 2015-12-15 DIAGNOSIS — N2581 Secondary hyperparathyroidism of renal origin: Secondary | ICD-10-CM | POA: Diagnosis not present

## 2015-12-15 DIAGNOSIS — E1129 Type 2 diabetes mellitus with other diabetic kidney complication: Secondary | ICD-10-CM | POA: Diagnosis not present

## 2015-12-15 DIAGNOSIS — N186 End stage renal disease: Secondary | ICD-10-CM | POA: Diagnosis not present

## 2015-12-15 DIAGNOSIS — D631 Anemia in chronic kidney disease: Secondary | ICD-10-CM | POA: Diagnosis not present

## 2015-12-18 DIAGNOSIS — N186 End stage renal disease: Secondary | ICD-10-CM | POA: Diagnosis not present

## 2015-12-18 DIAGNOSIS — N2581 Secondary hyperparathyroidism of renal origin: Secondary | ICD-10-CM | POA: Diagnosis not present

## 2015-12-18 DIAGNOSIS — D631 Anemia in chronic kidney disease: Secondary | ICD-10-CM | POA: Diagnosis not present

## 2015-12-18 DIAGNOSIS — D509 Iron deficiency anemia, unspecified: Secondary | ICD-10-CM | POA: Diagnosis not present

## 2015-12-18 DIAGNOSIS — E1129 Type 2 diabetes mellitus with other diabetic kidney complication: Secondary | ICD-10-CM | POA: Diagnosis not present

## 2015-12-20 DIAGNOSIS — N2581 Secondary hyperparathyroidism of renal origin: Secondary | ICD-10-CM | POA: Diagnosis not present

## 2015-12-20 DIAGNOSIS — D631 Anemia in chronic kidney disease: Secondary | ICD-10-CM | POA: Diagnosis not present

## 2015-12-20 DIAGNOSIS — E1129 Type 2 diabetes mellitus with other diabetic kidney complication: Secondary | ICD-10-CM | POA: Diagnosis not present

## 2015-12-20 DIAGNOSIS — D509 Iron deficiency anemia, unspecified: Secondary | ICD-10-CM | POA: Diagnosis not present

## 2015-12-20 DIAGNOSIS — N186 End stage renal disease: Secondary | ICD-10-CM | POA: Diagnosis not present

## 2015-12-22 DIAGNOSIS — D509 Iron deficiency anemia, unspecified: Secondary | ICD-10-CM | POA: Diagnosis not present

## 2015-12-22 DIAGNOSIS — N2581 Secondary hyperparathyroidism of renal origin: Secondary | ICD-10-CM | POA: Diagnosis not present

## 2015-12-22 DIAGNOSIS — N186 End stage renal disease: Secondary | ICD-10-CM | POA: Diagnosis not present

## 2015-12-22 DIAGNOSIS — D631 Anemia in chronic kidney disease: Secondary | ICD-10-CM | POA: Diagnosis not present

## 2015-12-22 DIAGNOSIS — E1129 Type 2 diabetes mellitus with other diabetic kidney complication: Secondary | ICD-10-CM | POA: Diagnosis not present

## 2015-12-25 DIAGNOSIS — N186 End stage renal disease: Secondary | ICD-10-CM | POA: Diagnosis not present

## 2015-12-25 DIAGNOSIS — M5416 Radiculopathy, lumbar region: Secondary | ICD-10-CM | POA: Diagnosis not present

## 2015-12-25 DIAGNOSIS — N2581 Secondary hyperparathyroidism of renal origin: Secondary | ICD-10-CM | POA: Diagnosis not present

## 2015-12-25 DIAGNOSIS — E1129 Type 2 diabetes mellitus with other diabetic kidney complication: Secondary | ICD-10-CM | POA: Diagnosis not present

## 2015-12-25 DIAGNOSIS — D509 Iron deficiency anemia, unspecified: Secondary | ICD-10-CM | POA: Diagnosis not present

## 2015-12-25 DIAGNOSIS — Z992 Dependence on renal dialysis: Secondary | ICD-10-CM | POA: Diagnosis not present

## 2015-12-25 DIAGNOSIS — D631 Anemia in chronic kidney disease: Secondary | ICD-10-CM | POA: Diagnosis not present

## 2015-12-27 DIAGNOSIS — N2581 Secondary hyperparathyroidism of renal origin: Secondary | ICD-10-CM | POA: Diagnosis not present

## 2015-12-27 DIAGNOSIS — E1129 Type 2 diabetes mellitus with other diabetic kidney complication: Secondary | ICD-10-CM | POA: Diagnosis not present

## 2015-12-27 DIAGNOSIS — N186 End stage renal disease: Secondary | ICD-10-CM | POA: Diagnosis not present

## 2015-12-27 DIAGNOSIS — D631 Anemia in chronic kidney disease: Secondary | ICD-10-CM | POA: Diagnosis not present

## 2015-12-27 DIAGNOSIS — D509 Iron deficiency anemia, unspecified: Secondary | ICD-10-CM | POA: Diagnosis not present

## 2015-12-27 DIAGNOSIS — Z23 Encounter for immunization: Secondary | ICD-10-CM | POA: Diagnosis not present

## 2015-12-28 DIAGNOSIS — Z01810 Encounter for preprocedural cardiovascular examination: Secondary | ICD-10-CM | POA: Diagnosis not present

## 2015-12-28 DIAGNOSIS — I5032 Chronic diastolic (congestive) heart failure: Secondary | ICD-10-CM | POA: Diagnosis not present

## 2015-12-29 DIAGNOSIS — D509 Iron deficiency anemia, unspecified: Secondary | ICD-10-CM | POA: Diagnosis not present

## 2015-12-29 DIAGNOSIS — D631 Anemia in chronic kidney disease: Secondary | ICD-10-CM | POA: Diagnosis not present

## 2015-12-29 DIAGNOSIS — N2581 Secondary hyperparathyroidism of renal origin: Secondary | ICD-10-CM | POA: Diagnosis not present

## 2015-12-29 DIAGNOSIS — Z23 Encounter for immunization: Secondary | ICD-10-CM | POA: Diagnosis not present

## 2015-12-29 DIAGNOSIS — N186 End stage renal disease: Secondary | ICD-10-CM | POA: Diagnosis not present

## 2016-01-01 DIAGNOSIS — N186 End stage renal disease: Secondary | ICD-10-CM | POA: Diagnosis not present

## 2016-01-01 DIAGNOSIS — D509 Iron deficiency anemia, unspecified: Secondary | ICD-10-CM | POA: Diagnosis not present

## 2016-01-01 DIAGNOSIS — D631 Anemia in chronic kidney disease: Secondary | ICD-10-CM | POA: Diagnosis not present

## 2016-01-01 DIAGNOSIS — N2581 Secondary hyperparathyroidism of renal origin: Secondary | ICD-10-CM | POA: Diagnosis not present

## 2016-01-01 DIAGNOSIS — Z23 Encounter for immunization: Secondary | ICD-10-CM | POA: Diagnosis not present

## 2016-01-03 DIAGNOSIS — Z23 Encounter for immunization: Secondary | ICD-10-CM | POA: Diagnosis not present

## 2016-01-03 DIAGNOSIS — D631 Anemia in chronic kidney disease: Secondary | ICD-10-CM | POA: Diagnosis not present

## 2016-01-03 DIAGNOSIS — N2581 Secondary hyperparathyroidism of renal origin: Secondary | ICD-10-CM | POA: Diagnosis not present

## 2016-01-03 DIAGNOSIS — N186 End stage renal disease: Secondary | ICD-10-CM | POA: Diagnosis not present

## 2016-01-03 DIAGNOSIS — D509 Iron deficiency anemia, unspecified: Secondary | ICD-10-CM | POA: Diagnosis not present

## 2016-01-05 DIAGNOSIS — D509 Iron deficiency anemia, unspecified: Secondary | ICD-10-CM | POA: Diagnosis not present

## 2016-01-05 DIAGNOSIS — N2581 Secondary hyperparathyroidism of renal origin: Secondary | ICD-10-CM | POA: Diagnosis not present

## 2016-01-05 DIAGNOSIS — N186 End stage renal disease: Secondary | ICD-10-CM | POA: Diagnosis not present

## 2016-01-05 DIAGNOSIS — D631 Anemia in chronic kidney disease: Secondary | ICD-10-CM | POA: Diagnosis not present

## 2016-01-05 DIAGNOSIS — Z23 Encounter for immunization: Secondary | ICD-10-CM | POA: Diagnosis not present

## 2016-01-08 DIAGNOSIS — D631 Anemia in chronic kidney disease: Secondary | ICD-10-CM | POA: Diagnosis not present

## 2016-01-08 DIAGNOSIS — N2581 Secondary hyperparathyroidism of renal origin: Secondary | ICD-10-CM | POA: Diagnosis not present

## 2016-01-08 DIAGNOSIS — Z23 Encounter for immunization: Secondary | ICD-10-CM | POA: Diagnosis not present

## 2016-01-08 DIAGNOSIS — D509 Iron deficiency anemia, unspecified: Secondary | ICD-10-CM | POA: Diagnosis not present

## 2016-01-08 DIAGNOSIS — N186 End stage renal disease: Secondary | ICD-10-CM | POA: Diagnosis not present

## 2016-01-10 DIAGNOSIS — D631 Anemia in chronic kidney disease: Secondary | ICD-10-CM | POA: Diagnosis not present

## 2016-01-10 DIAGNOSIS — N2581 Secondary hyperparathyroidism of renal origin: Secondary | ICD-10-CM | POA: Diagnosis not present

## 2016-01-10 DIAGNOSIS — Z23 Encounter for immunization: Secondary | ICD-10-CM | POA: Diagnosis not present

## 2016-01-10 DIAGNOSIS — N186 End stage renal disease: Secondary | ICD-10-CM | POA: Diagnosis not present

## 2016-01-10 DIAGNOSIS — D509 Iron deficiency anemia, unspecified: Secondary | ICD-10-CM | POA: Diagnosis not present

## 2016-01-12 DIAGNOSIS — N186 End stage renal disease: Secondary | ICD-10-CM | POA: Diagnosis not present

## 2016-01-12 DIAGNOSIS — D631 Anemia in chronic kidney disease: Secondary | ICD-10-CM | POA: Diagnosis not present

## 2016-01-12 DIAGNOSIS — N2581 Secondary hyperparathyroidism of renal origin: Secondary | ICD-10-CM | POA: Diagnosis not present

## 2016-01-13 DIAGNOSIS — N186 End stage renal disease: Secondary | ICD-10-CM | POA: Diagnosis not present

## 2016-01-13 DIAGNOSIS — Z992 Dependence on renal dialysis: Secondary | ICD-10-CM | POA: Diagnosis not present

## 2016-01-15 DIAGNOSIS — N186 End stage renal disease: Secondary | ICD-10-CM | POA: Diagnosis not present

## 2016-01-15 DIAGNOSIS — N2581 Secondary hyperparathyroidism of renal origin: Secondary | ICD-10-CM | POA: Diagnosis not present

## 2016-01-15 DIAGNOSIS — D631 Anemia in chronic kidney disease: Secondary | ICD-10-CM | POA: Diagnosis not present

## 2016-01-17 DIAGNOSIS — N2581 Secondary hyperparathyroidism of renal origin: Secondary | ICD-10-CM | POA: Diagnosis not present

## 2016-01-17 DIAGNOSIS — D631 Anemia in chronic kidney disease: Secondary | ICD-10-CM | POA: Diagnosis not present

## 2016-01-17 DIAGNOSIS — Z23 Encounter for immunization: Secondary | ICD-10-CM | POA: Diagnosis not present

## 2016-01-17 DIAGNOSIS — D509 Iron deficiency anemia, unspecified: Secondary | ICD-10-CM | POA: Diagnosis not present

## 2016-01-17 DIAGNOSIS — N186 End stage renal disease: Secondary | ICD-10-CM | POA: Diagnosis not present

## 2016-01-19 DIAGNOSIS — D631 Anemia in chronic kidney disease: Secondary | ICD-10-CM | POA: Diagnosis not present

## 2016-01-19 DIAGNOSIS — D509 Iron deficiency anemia, unspecified: Secondary | ICD-10-CM | POA: Diagnosis not present

## 2016-01-19 DIAGNOSIS — N186 End stage renal disease: Secondary | ICD-10-CM | POA: Diagnosis not present

## 2016-01-19 DIAGNOSIS — N2581 Secondary hyperparathyroidism of renal origin: Secondary | ICD-10-CM | POA: Diagnosis not present

## 2016-01-19 DIAGNOSIS — Z23 Encounter for immunization: Secondary | ICD-10-CM | POA: Diagnosis not present

## 2016-01-22 DIAGNOSIS — Z23 Encounter for immunization: Secondary | ICD-10-CM | POA: Diagnosis not present

## 2016-01-22 DIAGNOSIS — N2581 Secondary hyperparathyroidism of renal origin: Secondary | ICD-10-CM | POA: Diagnosis not present

## 2016-01-22 DIAGNOSIS — N186 End stage renal disease: Secondary | ICD-10-CM | POA: Diagnosis not present

## 2016-01-22 DIAGNOSIS — D631 Anemia in chronic kidney disease: Secondary | ICD-10-CM | POA: Diagnosis not present

## 2016-01-22 DIAGNOSIS — D509 Iron deficiency anemia, unspecified: Secondary | ICD-10-CM | POA: Diagnosis not present

## 2016-01-24 DIAGNOSIS — I739 Peripheral vascular disease, unspecified: Secondary | ICD-10-CM | POA: Diagnosis not present

## 2016-01-24 DIAGNOSIS — N186 End stage renal disease: Secondary | ICD-10-CM | POA: Diagnosis not present

## 2016-01-24 DIAGNOSIS — D509 Iron deficiency anemia, unspecified: Secondary | ICD-10-CM | POA: Diagnosis not present

## 2016-01-24 DIAGNOSIS — N2581 Secondary hyperparathyroidism of renal origin: Secondary | ICD-10-CM | POA: Diagnosis not present

## 2016-01-24 DIAGNOSIS — I5032 Chronic diastolic (congestive) heart failure: Secondary | ICD-10-CM | POA: Diagnosis not present

## 2016-01-24 DIAGNOSIS — Z23 Encounter for immunization: Secondary | ICD-10-CM | POA: Diagnosis not present

## 2016-01-24 DIAGNOSIS — I10 Essential (primary) hypertension: Secondary | ICD-10-CM | POA: Diagnosis not present

## 2016-01-24 DIAGNOSIS — D631 Anemia in chronic kidney disease: Secondary | ICD-10-CM | POA: Diagnosis not present

## 2016-01-25 DIAGNOSIS — Z992 Dependence on renal dialysis: Secondary | ICD-10-CM | POA: Diagnosis not present

## 2016-01-25 DIAGNOSIS — E1129 Type 2 diabetes mellitus with other diabetic kidney complication: Secondary | ICD-10-CM | POA: Diagnosis not present

## 2016-01-25 DIAGNOSIS — N186 End stage renal disease: Secondary | ICD-10-CM | POA: Diagnosis not present

## 2016-01-26 DIAGNOSIS — Z4931 Encounter for adequacy testing for hemodialysis: Secondary | ICD-10-CM | POA: Diagnosis not present

## 2016-01-26 DIAGNOSIS — D631 Anemia in chronic kidney disease: Secondary | ICD-10-CM | POA: Diagnosis not present

## 2016-01-26 DIAGNOSIS — N2581 Secondary hyperparathyroidism of renal origin: Secondary | ICD-10-CM | POA: Diagnosis not present

## 2016-01-26 DIAGNOSIS — N186 End stage renal disease: Secondary | ICD-10-CM | POA: Diagnosis not present

## 2016-01-29 DIAGNOSIS — N186 End stage renal disease: Secondary | ICD-10-CM | POA: Diagnosis not present

## 2016-01-29 DIAGNOSIS — Z4931 Encounter for adequacy testing for hemodialysis: Secondary | ICD-10-CM | POA: Diagnosis not present

## 2016-01-29 DIAGNOSIS — N2581 Secondary hyperparathyroidism of renal origin: Secondary | ICD-10-CM | POA: Diagnosis not present

## 2016-01-29 DIAGNOSIS — D631 Anemia in chronic kidney disease: Secondary | ICD-10-CM | POA: Diagnosis not present

## 2016-01-30 DIAGNOSIS — I871 Compression of vein: Secondary | ICD-10-CM | POA: Diagnosis not present

## 2016-01-30 DIAGNOSIS — T82858D Stenosis of vascular prosthetic devices, implants and grafts, subsequent encounter: Secondary | ICD-10-CM | POA: Diagnosis not present

## 2016-01-30 DIAGNOSIS — N186 End stage renal disease: Secondary | ICD-10-CM | POA: Diagnosis not present

## 2016-01-30 DIAGNOSIS — Z992 Dependence on renal dialysis: Secondary | ICD-10-CM | POA: Diagnosis not present

## 2016-01-31 DIAGNOSIS — N186 End stage renal disease: Secondary | ICD-10-CM | POA: Diagnosis not present

## 2016-01-31 DIAGNOSIS — D631 Anemia in chronic kidney disease: Secondary | ICD-10-CM | POA: Diagnosis not present

## 2016-01-31 DIAGNOSIS — N2581 Secondary hyperparathyroidism of renal origin: Secondary | ICD-10-CM | POA: Diagnosis not present

## 2016-01-31 DIAGNOSIS — Z4931 Encounter for adequacy testing for hemodialysis: Secondary | ICD-10-CM | POA: Diagnosis not present

## 2016-02-02 DIAGNOSIS — Z4931 Encounter for adequacy testing for hemodialysis: Secondary | ICD-10-CM | POA: Diagnosis not present

## 2016-02-02 DIAGNOSIS — N186 End stage renal disease: Secondary | ICD-10-CM | POA: Diagnosis not present

## 2016-02-02 DIAGNOSIS — N2581 Secondary hyperparathyroidism of renal origin: Secondary | ICD-10-CM | POA: Diagnosis not present

## 2016-02-02 DIAGNOSIS — D631 Anemia in chronic kidney disease: Secondary | ICD-10-CM | POA: Diagnosis not present

## 2016-02-05 DIAGNOSIS — Z4931 Encounter for adequacy testing for hemodialysis: Secondary | ICD-10-CM | POA: Diagnosis not present

## 2016-02-05 DIAGNOSIS — N186 End stage renal disease: Secondary | ICD-10-CM | POA: Diagnosis not present

## 2016-02-05 DIAGNOSIS — D631 Anemia in chronic kidney disease: Secondary | ICD-10-CM | POA: Diagnosis not present

## 2016-02-05 DIAGNOSIS — N2581 Secondary hyperparathyroidism of renal origin: Secondary | ICD-10-CM | POA: Diagnosis not present

## 2016-02-07 DIAGNOSIS — N2581 Secondary hyperparathyroidism of renal origin: Secondary | ICD-10-CM | POA: Diagnosis not present

## 2016-02-07 DIAGNOSIS — N186 End stage renal disease: Secondary | ICD-10-CM | POA: Diagnosis not present

## 2016-02-07 DIAGNOSIS — D631 Anemia in chronic kidney disease: Secondary | ICD-10-CM | POA: Diagnosis not present

## 2016-02-07 DIAGNOSIS — Z4931 Encounter for adequacy testing for hemodialysis: Secondary | ICD-10-CM | POA: Diagnosis not present

## 2016-02-09 DIAGNOSIS — Z4931 Encounter for adequacy testing for hemodialysis: Secondary | ICD-10-CM | POA: Diagnosis not present

## 2016-02-09 DIAGNOSIS — N186 End stage renal disease: Secondary | ICD-10-CM | POA: Diagnosis not present

## 2016-02-09 DIAGNOSIS — D631 Anemia in chronic kidney disease: Secondary | ICD-10-CM | POA: Diagnosis not present

## 2016-02-09 DIAGNOSIS — N2581 Secondary hyperparathyroidism of renal origin: Secondary | ICD-10-CM | POA: Diagnosis not present

## 2016-02-12 DIAGNOSIS — Z4931 Encounter for adequacy testing for hemodialysis: Secondary | ICD-10-CM | POA: Diagnosis not present

## 2016-02-12 DIAGNOSIS — D631 Anemia in chronic kidney disease: Secondary | ICD-10-CM | POA: Diagnosis not present

## 2016-02-12 DIAGNOSIS — N2581 Secondary hyperparathyroidism of renal origin: Secondary | ICD-10-CM | POA: Diagnosis not present

## 2016-02-12 DIAGNOSIS — N186 End stage renal disease: Secondary | ICD-10-CM | POA: Diagnosis not present

## 2016-02-14 ENCOUNTER — Encounter (HOSPITAL_COMMUNITY): Payer: Self-pay

## 2016-02-14 ENCOUNTER — Inpatient Hospital Stay (HOSPITAL_COMMUNITY)
Admission: EM | Admit: 2016-02-14 | Discharge: 2016-02-24 | DRG: 286 | Disposition: A | Discharge status: Home / Self Care | Payer: Medicare Other | Attending: Internal Medicine | Admitting: Internal Medicine

## 2016-02-14 DIAGNOSIS — R188 Other ascites: Secondary | ICD-10-CM | POA: Diagnosis present

## 2016-02-14 DIAGNOSIS — E11610 Type 2 diabetes mellitus with diabetic neuropathic arthropathy: Secondary | ICD-10-CM | POA: Diagnosis present

## 2016-02-14 DIAGNOSIS — N2581 Secondary hyperparathyroidism of renal origin: Secondary | ICD-10-CM | POA: Diagnosis not present

## 2016-02-14 DIAGNOSIS — I5033 Acute on chronic diastolic (congestive) heart failure: Secondary | ICD-10-CM | POA: Diagnosis present

## 2016-02-14 DIAGNOSIS — Z4931 Encounter for adequacy testing for hemodialysis: Secondary | ICD-10-CM | POA: Diagnosis not present

## 2016-02-14 DIAGNOSIS — I079 Rheumatic tricuspid valve disease, unspecified: Secondary | ICD-10-CM | POA: Diagnosis present

## 2016-02-14 DIAGNOSIS — Z8249 Family history of ischemic heart disease and other diseases of the circulatory system: Secondary | ICD-10-CM

## 2016-02-14 DIAGNOSIS — N186 End stage renal disease: Secondary | ICD-10-CM | POA: Diagnosis not present

## 2016-02-14 DIAGNOSIS — I425 Other restrictive cardiomyopathy: Secondary | ICD-10-CM | POA: Diagnosis present

## 2016-02-14 DIAGNOSIS — Z87891 Personal history of nicotine dependence: Secondary | ICD-10-CM

## 2016-02-14 DIAGNOSIS — I313 Pericardial effusion (noninflammatory): Secondary | ICD-10-CM | POA: Diagnosis not present

## 2016-02-14 DIAGNOSIS — E1365 Other specified diabetes mellitus with hyperglycemia: Secondary | ICD-10-CM

## 2016-02-14 DIAGNOSIS — E1322 Other specified diabetes mellitus with diabetic chronic kidney disease: Secondary | ICD-10-CM | POA: Diagnosis present

## 2016-02-14 DIAGNOSIS — E1143 Type 2 diabetes mellitus with diabetic autonomic (poly)neuropathy: Secondary | ICD-10-CM | POA: Diagnosis present

## 2016-02-14 DIAGNOSIS — Z881 Allergy status to other antibiotic agents status: Secondary | ICD-10-CM

## 2016-02-14 DIAGNOSIS — R1084 Generalized abdominal pain: Secondary | ICD-10-CM | POA: Diagnosis not present

## 2016-02-14 DIAGNOSIS — I2510 Atherosclerotic heart disease of native coronary artery without angina pectoris: Secondary | ICD-10-CM | POA: Diagnosis present

## 2016-02-14 DIAGNOSIS — Z7902 Long term (current) use of antithrombotics/antiplatelets: Secondary | ICD-10-CM

## 2016-02-14 DIAGNOSIS — Z794 Long term (current) use of insulin: Secondary | ICD-10-CM

## 2016-02-14 DIAGNOSIS — D631 Anemia in chronic kidney disease: Secondary | ICD-10-CM | POA: Diagnosis present

## 2016-02-14 DIAGNOSIS — E1122 Type 2 diabetes mellitus with diabetic chronic kidney disease: Secondary | ICD-10-CM | POA: Diagnosis present

## 2016-02-14 DIAGNOSIS — Z9114 Patient's other noncompliance with medication regimen: Secondary | ICD-10-CM

## 2016-02-14 DIAGNOSIS — E1165 Type 2 diabetes mellitus with hyperglycemia: Secondary | ICD-10-CM | POA: Diagnosis present

## 2016-02-14 DIAGNOSIS — K3184 Gastroparesis: Secondary | ICD-10-CM | POA: Diagnosis present

## 2016-02-14 DIAGNOSIS — E889 Metabolic disorder, unspecified: Secondary | ICD-10-CM | POA: Diagnosis present

## 2016-02-14 DIAGNOSIS — E1151 Type 2 diabetes mellitus with diabetic peripheral angiopathy without gangrene: Secondary | ICD-10-CM | POA: Diagnosis present

## 2016-02-14 DIAGNOSIS — I272 Other secondary pulmonary hypertension: Secondary | ICD-10-CM | POA: Diagnosis present

## 2016-02-14 DIAGNOSIS — I10 Essential (primary) hypertension: Secondary | ICD-10-CM | POA: Diagnosis present

## 2016-02-14 DIAGNOSIS — E781 Pure hyperglyceridemia: Secondary | ICD-10-CM | POA: Diagnosis present

## 2016-02-14 DIAGNOSIS — I3139 Other pericardial effusion (noninflammatory): Secondary | ICD-10-CM

## 2016-02-14 DIAGNOSIS — E8770 Fluid overload, unspecified: Secondary | ICD-10-CM

## 2016-02-14 DIAGNOSIS — D6489 Other specified anemias: Secondary | ICD-10-CM | POA: Diagnosis present

## 2016-02-14 DIAGNOSIS — T383X6A Underdosing of insulin and oral hypoglycemic [antidiabetic] drugs, initial encounter: Secondary | ICD-10-CM | POA: Diagnosis present

## 2016-02-14 DIAGNOSIS — IMO0002 Reserved for concepts with insufficient information to code with codable children: Secondary | ICD-10-CM | POA: Diagnosis present

## 2016-02-14 DIAGNOSIS — I132 Hypertensive heart and chronic kidney disease with heart failure and with stage 5 chronic kidney disease, or end stage renal disease: Secondary | ICD-10-CM | POA: Diagnosis not present

## 2016-02-14 DIAGNOSIS — E8809 Other disorders of plasma-protein metabolism, not elsewhere classified: Secondary | ICD-10-CM | POA: Diagnosis present

## 2016-02-14 DIAGNOSIS — Z833 Family history of diabetes mellitus: Secondary | ICD-10-CM

## 2016-02-14 DIAGNOSIS — Z882 Allergy status to sulfonamides status: Secondary | ICD-10-CM

## 2016-02-14 DIAGNOSIS — K5900 Constipation, unspecified: Secondary | ICD-10-CM | POA: Diagnosis present

## 2016-02-14 DIAGNOSIS — R140 Abdominal distension (gaseous): Secondary | ICD-10-CM | POA: Diagnosis not present

## 2016-02-14 DIAGNOSIS — I160 Hypertensive urgency: Secondary | ICD-10-CM | POA: Diagnosis present

## 2016-02-14 DIAGNOSIS — Z992 Dependence on renal dialysis: Secondary | ICD-10-CM

## 2016-02-14 LAB — COMPREHENSIVE METABOLIC PANEL
ALT: 11 U/L — ABNORMAL LOW (ref 14–54)
AST: 12 U/L — AB (ref 15–41)
Albumin: 2.8 g/dL — ABNORMAL LOW (ref 3.5–5.0)
Alkaline Phosphatase: 236 U/L — ABNORMAL HIGH (ref 38–126)
Anion gap: 13 (ref 5–15)
BILIRUBIN TOTAL: 0.8 mg/dL (ref 0.3–1.2)
BUN: 18 mg/dL (ref 6–20)
CO2: 30 mmol/L (ref 22–32)
CREATININE: 4.01 mg/dL — AB (ref 0.44–1.00)
Calcium: 9 mg/dL (ref 8.9–10.3)
Chloride: 94 mmol/L — ABNORMAL LOW (ref 101–111)
GFR, EST AFRICAN AMERICAN: 15 mL/min — AB (ref >60)
GFR, EST NON AFRICAN AMERICAN: 13 mL/min — AB (ref >60)
Glucose, Bld: 149 mg/dL — ABNORMAL HIGH (ref 65–99)
POTASSIUM: 3.7 mmol/L (ref 3.5–5.1)
Sodium: 137 mmol/L (ref 135–145)
TOTAL PROTEIN: 6.2 g/dL — AB (ref 6.5–8.1)

## 2016-02-14 LAB — CBC
HCT: 32.5 % — ABNORMAL LOW (ref 36.0–46.0)
Hemoglobin: 9.7 g/dL — ABNORMAL LOW (ref 12.0–15.0)
MCH: 27.8 pg (ref 26.0–34.0)
MCHC: 29.8 g/dL — ABNORMAL LOW (ref 30.0–36.0)
MCV: 93.1 fL (ref 78.0–100.0)
PLATELETS: 222 10*3/uL (ref 150–400)
RBC: 3.49 MIL/uL — AB (ref 3.87–5.11)
RDW: 17.1 % — AB (ref 11.5–15.5)
WBC: 8.1 10*3/uL (ref 4.0–10.5)

## 2016-02-14 LAB — LIPASE, BLOOD: LIPASE: 30 U/L (ref 11–51)

## 2016-02-14 NOTE — ED Triage Notes (Signed)
Pt reports abd distention X1.5 weeks. Pt reports she is MWF dialysis and MD wanted her to be evaluated. Pt reports pain in the abd. Pt reports n/v.

## 2016-02-14 NOTE — ED Provider Notes (Signed)
[facility] DEPT Provider Note   CSN: [id number] Arrival date & time: 02/14/16  1827  By signing my name below, I, Emmanuella Mensah, attest that this documentation has been prepared under the direction and in the presence of Orpah Greek, MD. Electronically Signed: Judithann Sauger, ED Scribe. 02/14/16. 11:52 PM.   History   Chief Complaint Chief Complaint  Patient presents with  . Abdominal Pain    HPI Comments: [name] is a 40 y.o. female with a hx of DM, CAD, ESRD on dialysis, gastroparesis, hypertension, and renal insufficiency who presents to the Emergency Department complaining of gradually worsening moderate generalized abdominal distention with pain onset 1.5 weeks ago. She notes that the pain is worse on her bilateral suprapubic region. She reports associated chills, nausea, and constipation. She adds that her last BM was 3 days ago. No alleviating factors noted. Pt has not tried any medications PTA. She denies a hx of abdominal surgeries or any liver issues. She also denies a hx of Diverticulosis. She states that she is a MWF dialysis patient. She denies any fever, vomiting, or diarrhea.   The history is provided by the patient. No language interpreter was used.  Abdominal Pain   This is a new problem. The current episode started more than 1 week ago. The problem has been gradually worsening. The pain is located in the generalized abdominal region. The pain is moderate. Associated symptoms include nausea and constipation. Pertinent negatives include fever, diarrhea and vomiting. Nothing aggravates the symptoms. Nothing relieves the symptoms.    Past Medical History:  Diagnosis Date  . Chronic anemia    2nd to renal disease  . CIN III (cervical intraepithelial neoplasia grade III) with severe dysplasia    S/P LEEP AND CONE  . Coronary artery disease    Status post cardiac catheterization June 2012 scattered coronary artery disease/atherosclerosis with  70-80% stenosis in a small right PDA.  . Diabetic Charcot's joint disease ([place])   . DM (diabetes mellitus) ([place])    Long-term insulin  . End stage renal disease on dialysis ([place]) 05/02/11   [facility]; M; W, F; last time 05/01/11  . Fracture of 5th metatarsal 2016   Right  . Gastroparesis   . 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 History of abscesses in groins 12/06/2010  . Hyperlipidemia    Hypertriglyceridemia 449 HDL 25  . Hypertension   . Migraines    "just on dialysis days"  . Peripheral neuropathy ([facility])    related to DM  . Peripheral vascular disease ([place])    Tibial occlusive disease evaluated by Dr. Kellie Simmering in August 2011. Medical therapy  . Renal insufficiency    Dialysis since 2012  . Tobacco use disorder    Discontinued March 2012  . Trimalleolar fracture of ankle, closed 02/09/2015   Right    Patient Active Problem List   Diagnosis Date Noted  . Ascites 02/15/2016  . DM (diabetes mellitus), type 2 with renal complications ([place]) [id number]  . Pain   . Bimalleolar ankle fracture 02/12/2015  . CAD (coronary artery disease) 02/09/2015  . Peripheral vascular disease ([place])   . Gastroparesis   . Chronic anemia   . Menorrhagia with regular cycle 01/04/2014  . Dysmenorrhea 01/04/2014  . Nausea & vomiting 10/16/2011  . History of noncompliance with medical treatment 05/09/2011  . Chronic UTI 05/09/2011  . CIN III (cervical intraepithelial neoplasia grade III) with severe dysplasia   . History of abscesses in groins 12/06/2010  . End  stage renal disease on dialysis ([place])   . Coronary artery disease   . Hyperlipidemia   . 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 NONDEPENDENT TOBACCO USE DISORDER 12/20/2009  . Essential hypertension, benign 12/20/2009  . NEPHROTIC SYNDROME 12/20/2009  . Shortness of breath 12/20/2009  . Chest pain 12/20/2009    Past Surgical History:  Procedure Laterality Date  . AV FISTULA PLACEMENT  04/2010  . CARDIAC CATHETERIZATION [name] 02/10/2015   Procedure: Left  Heart Cath and Coronary Angiography;  Surgeon: Dixie Dials, MD;  Location: [facility];  Service: Cardiovascular;  Laterality: [name];  . CERVICAL BIOPSY  W/ LOOP ELECTRODE EXCISION     h/o  . CERVICAL CONE BIOPSY     h/o  . COLPOSCOPY    . DILATION AND CURETTAGE OF UTERUS  2009  . ORIF ANKLE FRACTURE Right 02/09/2015  . ORIF ANKLE FRACTURE Right 02/16/2015   Procedure: OPEN REDUCTION INTERNAL FIXATION (ORIF) RIGHT ANKLE FRACTURE;  Surgeon: Altamese [place], MD;  Location: [facility];  Service: Orthopedics;  Laterality: Right;    OB History    Gravida Para Term Preterm AB Living   2 1 1   1 1    SAB TAB Ectopic Multiple Live Births                   Home Medications    Prior to Admission medications   Medication Sig Start Date End Date Taking? Authorizing Provider  atorvastatin (LIPITOR) 40 MG tablet Take 40 mg by mouth daily.   Yes Historical Provider, MD  cloNIDine (CATAPRES) 0.3 MG tablet Take 1 tablet (0.3 mg total) by mouth 3 (three) times daily. 11/01/14  Yes Debbe Odea, MD  clopidogrel (PLAVIX) 75 MG tablet Take 75 mg by mouth at bedtime.    Yes Historical Provider, MD  HUMALOG MIX 50/50 KWIKPEN (50-50) 100 UNIT/ML Kwikpen Inject 26-28 Units into the skin 2 (two) times daily. 28 in the morning and 26 in the evening. 04/29/14  Yes Historical Provider, MD  hydrALAZINE (APRESOLINE) 50 MG tablet Take 1 tablet (50 mg total) by mouth every 8 (eight) hours. 11/01/14  Yes Debbe Odea, MD  isosorbide mononitrate (IMDUR) 60 MG 24 hr tablet Take 60 mg by mouth daily.   Yes Historical Provider, MD  labetalol (NORMODYNE) 300 MG tablet Take 1 tablet (300 mg total) by mouth 3 (three) times daily. 11/01/14  Yes Debbe Odea, MD  RENVELA 800 MG tablet Take 800 mg by mouth 3 (three) times daily with meals.  04/17/13  Yes Historical Provider, MD  amLODipine (NORVASC) 10 MG tablet Take 1 tablet (10 mg total) by mouth daily. Patient not taking: Reported on 02/15/2016 07/05/13   Minus Breeding, MD    Family  History Family History  Problem Relation Age of Onset  . Diabetes Mother   . Hypertension Mother   . Diabetes Father   . Hyperlipidemia Father   . Hypertension Father   . Diabetes Sister   . Stroke Maternal Grandmother   . Diabetes Sister   . Breast cancer Maternal Aunt     Age 42's    Social History Social History  Substance Use Topics  . Smoking status: Former Smoker    Packs/day: 0.30    Years: 10.00    Types: Cigarettes    Quit date: 08/05/2011  . Smokeless tobacco: Never Used     Comment: "quit smoking cigarettes 07/2010"  . Alcohol use No     Allergies   Cephalexin and Sulfamethoxazole-trimethoprim  Review of Systems Review of Systems  Constitutional: Positive for chills. Negative for fever.  Gastrointestinal: Positive for abdominal pain, constipation and nausea. Negative for diarrhea and vomiting.  All other systems reviewed and are negative.    Physical Exam Updated Vital Signs BP (!) 201/83   Pulse 83   Temp 98.3 F (36.8 C) (Oral)   Resp 16   Wt 209 lb (94.8 kg)   LMP 09/16/2015 Comment: dialysis  SpO2 96%   BMI 25.44 kg/m   Physical Exam  Constitutional: She is oriented to person, place, and time. She appears well-developed and well-nourished. No distress.  HENT:  Head: Normocephalic and atraumatic.  Right Ear: Hearing normal.  Left Ear: Hearing normal.  Nose: Nose normal.  Mouth/Throat: Oropharynx is clear and moist and mucous membranes are normal.  Eyes: Conjunctivae and EOM are normal. Pupils are equal, round, and reactive to light.  Neck: Normal range of motion. Neck supple.  Cardiovascular: Regular rhythm, S1 normal and S2 normal.  Exam reveals no gallop and no friction rub.   No murmur heard. Pulmonary/Chest: Effort normal and breath sounds normal. No respiratory distress. She exhibits no tenderness.  Abdominal: Soft. Normal appearance. She exhibits distension. There is no hepatosplenomegaly. There is tenderness. There is no rebound,  no guarding, no tenderness at McBurney's point and negative Murphy's sign. No hernia.  Abdomen diffusely tender Bowel sounds increased in frequency and in pitch  Musculoskeletal: Normal range of motion.  Neurological: She is alert and oriented to person, place, and time. She has normal strength. No cranial nerve deficit or sensory deficit. Coordination normal. GCS eye subscore is 4. GCS verbal subscore is 5. GCS motor subscore is 6.  Skin: Skin is warm, dry and intact. No rash noted. No cyanosis.  Psychiatric: She has a normal mood and affect. Her speech is normal and behavior is normal. Thought content normal.  Nursing note and vitals reviewed.    ED Treatments / Results  DIAGNOSTIC STUDIES: Oxygen Saturation is 95% on RA, adequate by my interpretation.    COORDINATION OF CARE: 11:48 PM- Pt advised of plan for treatment and pt agrees. Pt informed of her lab results. She will receive CT abdomen for further evaluation.    Labs (all labs ordered are listed, but only abnormal results are displayed) Labs Reviewed  COMPREHENSIVE METABOLIC PANEL - Abnormal; Notable for the following:       Result Value   Chloride 94 (*)    Glucose, Bld 149 (*)    Creatinine, Ser 4.01 (*)    Total Protein 6.2 (*)    Albumin 2.8 (*)    AST 12 (*)    ALT 11 (*)    Alkaline Phosphatase 236 (*)    GFR calc non Af Amer 13 (*)    GFR calc Af Amer 15 (*)    All other components within normal limits  CBC - Abnormal; Notable for the following:    RBC 3.49 (*)    Hemoglobin 9.7 (*)    HCT 32.5 (*)    MCHC 29.8 (*)    RDW 17.1 (*)    All other components within normal limits  LIPASE, BLOOD  I-STAT BETA HCG BLOOD, ED ([facility], [facility], [facility] ONLY)  I-STAT CG4 LACTIC ACID, ED    EKG  EKG Interpretation None       Radiology Ct Abdomen Pelvis W Contrast  Result Date: 02/15/2016 CLINICAL DATA:  Acute onset of generalized abdominal distention, nausea and vomiting. Abdominal pain. Initial encounter. EXAM:  CT  ABDOMEN AND PELVIS WITH CONTRAST TECHNIQUE: Multidetector CT imaging of the abdomen and pelvis was performed using the standard protocol following bolus administration of intravenous contrast. CONTRAST:  100 mL ISOVUE-300 IOPAMIDOL (ISOVUE-300) INJECTION 61% COMPARISON:  MRI of the lumbar spine performed 11/02/2015, and CT of the abdomen and pelvis from 05/24/2014 FINDINGS: Lower chest: A small to moderate pericardial effusion is noted. Diffuse coronary artery calcifications are seen. The visualized lung bases appear grossly clear. Hepatobiliary: The liver is grossly unremarkable in appearance. The gallbladder is grossly unremarkable, though difficult to fully assess given adjacent ascites. The common bile duct remains normal in caliber. Pancreas: The pancreas is within normal limits. Spleen: The spleen is unremarkable in appearance. Adrenals/Urinary Tract: The adrenal glands are unremarkable in appearance. There is moderate renal atrophy bilaterally, with scattered small bilateral renal cysts and underlying vascular calcifications at both renal hila. There is no evidence of hydronephrosis. Stomach/Bowel: The stomach is unremarkable in appearance. The small bowel is within normal limits. The appendix is normal in caliber, without evidence of appendicitis. The colon is unremarkable in appearance. Vascular/Lymphatic: Relatively diffuse calcification is seen along the abdominal aorta and its branches. The inferior vena cava is grossly unremarkable. No pelvic sidewall lymphadenopathy is seen. There is no evidence of retroperitoneal or inguinal lymphadenopathy. Reproductive: The bladder is mildly distended and within normal limits. The uterus is grossly unremarkable in appearance. The ovaries are relatively symmetric. No suspicious adnexal masses are seen. Other: Small to moderate volume ascites is seen within the abdomen and pelvis. No additional soft tissue abnormalities are seen. Musculoskeletal: No acute osseous  abnormalities are identified. The visualized musculature is unremarkable in appearance. IMPRESSION: 1. Small to moderate volume ascites noted within the abdomen and pelvis. 2. Small to moderate pericardial effusion seen. 3. Diffuse coronary artery calcifications noted. 4. Aortic atherosclerosis. 5. Moderate bilateral renal atrophy, with scattered small bilateral renal cysts. Electronically Signed   By: Garald Balding M.D.   On: 02/15/2016 02:54    Procedures Procedures (including critical care time)  Medications Ordered in ED Medications  amLODipine (NORVASC) tablet 10 mg (not administered)  atorvastatin (LIPITOR) tablet 40 mg (not administered)  cloNIDine (CATAPRES) tablet 0.3 mg (not administered)  clopidogrel (PLAVIX) tablet 75 mg (not administered)  Insulin Lispro Prot & Lispro (HUMALOG 50/50 MIX) (50-50) 100 UNIT/ML Kwikpen 26-28 Units (not administered)  hydrALAZINE (APRESOLINE) tablet 50 mg (not administered)  isosorbide mononitrate (IMDUR) 24 hr tablet 60 mg (not administered)  labetalol (NORMODYNE) tablet 300 mg (not administered)  sevelamer carbonate (RENVELA) tablet 800 mg (not administered)  iopamidol (ISOVUE-300) 61 % injection (100 mLs  Contrast Given 02/15/16 0155)     Initial Impression / Assessment and Plan / ED Course  Orpah Greek, MD has reviewed the triage vital signs and the nursing notes.  Pertinent labs & imaging results that were available during my care of the patient were reviewed by me and considered in my medical decision making (see chart for details).  Clinical Course   Patient presents to the emergency department for evaluation of abdominal pain. Patient reports that for the last 1-1/2 weeks she has had progressively worsening abdominal pain and distention. Patient is a dialysis patient. She was dialyzed earlier today. She has not experiencing any chest pain or shortness of breath. She has not had fever.  Lab work was unremarkable. Patient  underwent CT scan of abdomen and pelvis to further evaluate for possible obstruction or other etiology such as ascites. Patient is found to have  moderate ascites. She also has a pericardial effusion. There is no Not physiology. Etiology of the effusions is unclear. She is not experiencing any shortness of breath and does not appear to be volume overloaded. She has been attending dialysis regularly, including today. Lower lung fields were clear on CT scan, no sign of pulmonary edema. Patient will require further workup of the effusions. This would include concern for malignancy.  Final Clinical Impressions(s) / ED Diagnoses   Final diagnoses:  Ascites  Pericardial effusion    New Prescriptions New Prescriptions   No medications on file   I personally performed the services described in this documentation, which was scribed in my presence. The recorded information has been reviewed and is accurate.    Orpah Greek, MD 02/15/16 [phone]

## 2016-02-15 ENCOUNTER — Emergency Department (HOSPITAL_COMMUNITY): Payer: Medicare Other

## 2016-02-15 ENCOUNTER — Observation Stay (HOSPITAL_COMMUNITY): Payer: Medicare Other

## 2016-02-15 DIAGNOSIS — E8779 Other fluid overload: Secondary | ICD-10-CM | POA: Diagnosis not present

## 2016-02-15 DIAGNOSIS — I771 Stricture of artery: Secondary | ICD-10-CM | POA: Diagnosis not present

## 2016-02-15 DIAGNOSIS — Z9114 Patient's other noncompliance with medication regimen: Secondary | ICD-10-CM | POA: Diagnosis not present

## 2016-02-15 DIAGNOSIS — N2581 Secondary hyperparathyroidism of renal origin: Secondary | ICD-10-CM | POA: Diagnosis not present

## 2016-02-15 DIAGNOSIS — E1122 Type 2 diabetes mellitus with diabetic chronic kidney disease: Secondary | ICD-10-CM

## 2016-02-15 DIAGNOSIS — E8809 Other disorders of plasma-protein metabolism, not elsewhere classified: Secondary | ICD-10-CM | POA: Diagnosis present

## 2016-02-15 DIAGNOSIS — E1143 Type 2 diabetes mellitus with diabetic autonomic (poly)neuropathy: Secondary | ICD-10-CM | POA: Diagnosis not present

## 2016-02-15 DIAGNOSIS — Z794 Long term (current) use of insulin: Secondary | ICD-10-CM

## 2016-02-15 DIAGNOSIS — Z992 Dependence on renal dialysis: Secondary | ICD-10-CM | POA: Diagnosis not present

## 2016-02-15 DIAGNOSIS — K5900 Constipation, unspecified: Secondary | ICD-10-CM | POA: Diagnosis present

## 2016-02-15 DIAGNOSIS — N186 End stage renal disease: Secondary | ICD-10-CM | POA: Diagnosis not present

## 2016-02-15 DIAGNOSIS — I10 Essential (primary) hypertension: Secondary | ICD-10-CM | POA: Diagnosis not present

## 2016-02-15 DIAGNOSIS — E781 Pure hyperglyceridemia: Secondary | ICD-10-CM | POA: Diagnosis present

## 2016-02-15 DIAGNOSIS — I120 Hypertensive chronic kidney disease with stage 5 chronic kidney disease or end stage renal disease: Secondary | ICD-10-CM | POA: Diagnosis not present

## 2016-02-15 DIAGNOSIS — R188 Other ascites: Secondary | ICD-10-CM | POA: Diagnosis present

## 2016-02-15 DIAGNOSIS — I132 Hypertensive heart and chronic kidney disease with heart failure and with stage 5 chronic kidney disease, or end stage renal disease: Secondary | ICD-10-CM | POA: Diagnosis not present

## 2016-02-15 DIAGNOSIS — I2510 Atherosclerotic heart disease of native coronary artery without angina pectoris: Secondary | ICD-10-CM | POA: Diagnosis present

## 2016-02-15 DIAGNOSIS — D631 Anemia in chronic kidney disease: Secondary | ICD-10-CM | POA: Diagnosis present

## 2016-02-15 DIAGNOSIS — I119 Hypertensive heart disease without heart failure: Secondary | ICD-10-CM | POA: Diagnosis not present

## 2016-02-15 DIAGNOSIS — I079 Rheumatic tricuspid valve disease, unspecified: Secondary | ICD-10-CM | POA: Diagnosis present

## 2016-02-15 DIAGNOSIS — I319 Disease of pericardium, unspecified: Secondary | ICD-10-CM | POA: Diagnosis not present

## 2016-02-15 DIAGNOSIS — T383X6A Underdosing of insulin and oral hypoglycemic [antidiabetic] drugs, initial encounter: Secondary | ICD-10-CM | POA: Diagnosis present

## 2016-02-15 DIAGNOSIS — I110 Hypertensive heart disease with heart failure: Secondary | ICD-10-CM | POA: Diagnosis not present

## 2016-02-15 DIAGNOSIS — E1151 Type 2 diabetes mellitus with diabetic peripheral angiopathy without gangrene: Secondary | ICD-10-CM | POA: Diagnosis present

## 2016-02-15 DIAGNOSIS — D6489 Other specified anemias: Secondary | ICD-10-CM | POA: Diagnosis present

## 2016-02-15 DIAGNOSIS — I425 Other restrictive cardiomyopathy: Secondary | ICD-10-CM | POA: Diagnosis not present

## 2016-02-15 DIAGNOSIS — K3184 Gastroparesis: Secondary | ICD-10-CM | POA: Diagnosis not present

## 2016-02-15 DIAGNOSIS — I272 Other secondary pulmonary hypertension: Secondary | ICD-10-CM | POA: Diagnosis not present

## 2016-02-15 DIAGNOSIS — R1084 Generalized abdominal pain: Secondary | ICD-10-CM | POA: Diagnosis not present

## 2016-02-15 DIAGNOSIS — E1165 Type 2 diabetes mellitus with hyperglycemia: Secondary | ICD-10-CM | POA: Diagnosis not present

## 2016-02-15 DIAGNOSIS — E11610 Type 2 diabetes mellitus with diabetic neuropathic arthropathy: Secondary | ICD-10-CM | POA: Diagnosis present

## 2016-02-15 DIAGNOSIS — I5033 Acute on chronic diastolic (congestive) heart failure: Secondary | ICD-10-CM | POA: Diagnosis not present

## 2016-02-15 DIAGNOSIS — I160 Hypertensive urgency: Secondary | ICD-10-CM | POA: Diagnosis not present

## 2016-02-15 DIAGNOSIS — I739 Peripheral vascular disease, unspecified: Secondary | ICD-10-CM | POA: Diagnosis not present

## 2016-02-15 DIAGNOSIS — I517 Cardiomegaly: Secondary | ICD-10-CM | POA: Diagnosis not present

## 2016-02-15 DIAGNOSIS — I313 Pericardial effusion (noninflammatory): Secondary | ICD-10-CM | POA: Diagnosis not present

## 2016-02-15 LAB — GLUCOSE, CAPILLARY
GLUCOSE-CAPILLARY: 599 mg/dL — AB (ref 65–99)
Glucose-Capillary: 475 mg/dL — ABNORMAL HIGH (ref 65–99)
Glucose-Capillary: 109 mg/dL — ABNORMAL HIGH (ref 65–99)
Glucose-Capillary: 120 mg/dL — ABNORMAL HIGH (ref 65–99)

## 2016-02-15 LAB — GRAM STAIN

## 2016-02-15 LAB — BODY FLUID CELL COUNT WITH DIFFERENTIAL
Eos, Fluid: 0 %
Lymphs, Fluid: 9 %
Monocyte-Macrophage-Serous Fluid: 9 % — ABNORMAL LOW (ref 50–90)
Neutrophil Count, Fluid: 82 % — ABNORMAL HIGH (ref 0–25)
OTHER CELLS FL: 0 %
Total Nucleated Cell Count, Fluid: 4300 cu mm — ABNORMAL HIGH (ref 0–1000)

## 2016-02-15 LAB — BASIC METABOLIC PANEL
ANION GAP: 13 (ref 5–15)
BUN: 27 mg/dL — ABNORMAL HIGH (ref 6–20)
CALCIUM: 9.1 mg/dL (ref 8.9–10.3)
CO2: 29 mmol/L (ref 22–32)
Chloride: 90 mmol/L — ABNORMAL LOW (ref 101–111)
Creatinine, Ser: 4.74 mg/dL — ABNORMAL HIGH (ref 0.44–1.00)
GFR calc non Af Amer: 11 mL/min — ABNORMAL LOW (ref >60)
GFR, EST AFRICAN AMERICAN: 12 mL/min — AB (ref >60)
Glucose, Bld: 599 mg/dL (ref 65–99)
Potassium: 4.4 mmol/L (ref 3.5–5.1)
SODIUM: 132 mmol/L — AB (ref 135–145)

## 2016-02-15 LAB — I-STAT BETA HCG BLOOD, ED (MC, WL, AP ONLY)

## 2016-02-15 LAB — PROTEIN, BODY FLUID: TOTAL PROTEIN, FLUID: 4 g/dL

## 2016-02-15 LAB — GLUCOSE, SEROUS FLUID: GLUCOSE FL: 605 mg/dL

## 2016-02-15 LAB — LACTATE DEHYDROGENASE, PLEURAL OR PERITONEAL FLUID: LD, Fluid: 151 U/L — ABNORMAL HIGH (ref 3–23)

## 2016-02-15 LAB — PROTIME-INR
INR: 1.24
Prothrombin Time: 15.7 seconds — ABNORMAL HIGH (ref 11.4–15.2)

## 2016-02-15 LAB — I-STAT CG4 LACTIC ACID, ED: Lactic Acid, Venous: 0.9 mmol/L (ref 0.5–1.9)

## 2016-02-15 MED ORDER — INSULIN ASPART 100 UNIT/ML ~~LOC~~ SOLN
5.0000 [IU] | Freq: Once | SUBCUTANEOUS | Status: AC
  Administered 2016-02-15: 5 [IU] via SUBCUTANEOUS

## 2016-02-15 MED ORDER — LABETALOL HCL 200 MG PO TABS
300.0000 mg | ORAL_TABLET | Freq: Three times a day (TID) | ORAL | Status: DC
  Administered 2016-02-15 – 2016-02-24 (×25): 300 mg via ORAL
  Filled 2016-02-15 (×24): qty 1
  Filled 2016-02-15: qty 2
  Filled 2016-02-15: qty 1

## 2016-02-15 MED ORDER — AMLODIPINE BESYLATE 10 MG PO TABS
10.0000 mg | ORAL_TABLET | Freq: Every day | ORAL | Status: DC
  Administered 2016-02-15 – 2016-02-23 (×9): 10 mg via ORAL
  Filled 2016-02-15 (×10): qty 1

## 2016-02-15 MED ORDER — IOPAMIDOL (ISOVUE-300) INJECTION 61%
INTRAVENOUS | Status: AC
  Administered 2016-02-15: 100 mL
  Filled 2016-02-15: qty 100

## 2016-02-15 MED ORDER — INSULIN LISPRO PROT & LISPRO (50-50 MIX) 100 UNIT/ML KWIKPEN
26.0000 [IU] | PEN_INJECTOR | Freq: Two times a day (BID) | SUBCUTANEOUS | Status: DC
Start: 2016-02-15 — End: 2016-02-15

## 2016-02-15 MED ORDER — SUCROFERRIC OXYHYDROXIDE 500 MG PO CHEW
500.0000 mg | CHEWABLE_TABLET | Freq: Three times a day (TID) | ORAL | Status: DC
  Filled 2016-02-15 (×3): qty 1

## 2016-02-15 MED ORDER — INSULIN ASPART 100 UNIT/ML ~~LOC~~ SOLN
0.0000 [IU] | Freq: Three times a day (TID) | SUBCUTANEOUS | Status: DC
  Administered 2016-02-15: 9 [IU] via SUBCUTANEOUS
  Administered 2016-02-16 (×2): 2 [IU] via SUBCUTANEOUS
  Administered 2016-02-17: 1 [IU] via SUBCUTANEOUS
  Administered 2016-02-17 – 2016-02-22 (×5): 2 [IU] via SUBCUTANEOUS
  Administered 2016-02-23: 5 [IU] via SUBCUTANEOUS

## 2016-02-15 MED ORDER — HEPARIN SODIUM (PORCINE) 5000 UNIT/ML IJ SOLN
5000.0000 [IU] | Freq: Three times a day (TID) | INTRAMUSCULAR | Status: DC
  Administered 2016-02-15 – 2016-02-21 (×7): 5000 [IU] via SUBCUTANEOUS
  Filled 2016-02-15 (×15): qty 1

## 2016-02-15 MED ORDER — LIDOCAINE HCL 1 % IJ SOLN
INTRAMUSCULAR | Status: AC
  Filled 2016-02-15: qty 20

## 2016-02-15 MED ORDER — ATORVASTATIN CALCIUM 40 MG PO TABS
40.0000 mg | ORAL_TABLET | Freq: Every day | ORAL | Status: DC
  Administered 2016-02-15 – 2016-02-23 (×8): 40 mg via ORAL
  Filled 2016-02-15 (×8): qty 1

## 2016-02-15 MED ORDER — CLONIDINE HCL 0.2 MG PO TABS
0.3000 mg | ORAL_TABLET | Freq: Three times a day (TID) | ORAL | Status: DC
  Administered 2016-02-15 – 2016-02-24 (×25): 0.3 mg via ORAL
  Filled 2016-02-15 (×27): qty 1

## 2016-02-15 MED ORDER — INSULIN ASPART PROT & ASPART (70-30 MIX) 100 UNIT/ML ~~LOC~~ SUSP
28.0000 [IU] | Freq: Every day | SUBCUTANEOUS | Status: DC
  Administered 2016-02-15 – 2016-02-18 (×4): 28 [IU] via SUBCUTANEOUS
  Filled 2016-02-15 (×2): qty 10

## 2016-02-15 MED ORDER — CLOPIDOGREL BISULFATE 75 MG PO TABS
75.0000 mg | ORAL_TABLET | Freq: Every day | ORAL | Status: DC
  Administered 2016-02-15 – 2016-02-23 (×9): 75 mg via ORAL
  Filled 2016-02-15 (×9): qty 1

## 2016-02-15 MED ORDER — ZOLPIDEM TARTRATE 5 MG PO TABS
5.0000 mg | ORAL_TABLET | Freq: Every evening | ORAL | Status: DC | PRN
  Administered 2016-02-15 – 2016-02-23 (×9): 5 mg via ORAL
  Filled 2016-02-15 (×9): qty 1

## 2016-02-15 MED ORDER — HYDRALAZINE HCL 50 MG PO TABS
50.0000 mg | ORAL_TABLET | Freq: Three times a day (TID) | ORAL | Status: DC
  Administered 2016-02-15 – 2016-02-17 (×6): 50 mg via ORAL
  Filled 2016-02-15 (×7): qty 1

## 2016-02-15 MED ORDER — SEVELAMER CARBONATE 800 MG PO TABS
800.0000 mg | ORAL_TABLET | Freq: Three times a day (TID) | ORAL | Status: DC
  Administered 2016-02-15 (×3): 800 mg via ORAL
  Filled 2016-02-15 (×3): qty 1

## 2016-02-15 MED ORDER — ISOSORBIDE MONONITRATE ER 60 MG PO TB24
60.0000 mg | ORAL_TABLET | Freq: Every day | ORAL | Status: DC
  Administered 2016-02-15 – 2016-02-17 (×3): 60 mg via ORAL
  Filled 2016-02-15 (×3): qty 1

## 2016-02-15 MED ORDER — ACETAMINOPHEN 325 MG PO TABS
650.0000 mg | ORAL_TABLET | Freq: Four times a day (QID) | ORAL | Status: DC | PRN
  Administered 2016-02-16 – 2016-02-20 (×9): 650 mg via ORAL
  Filled 2016-02-15 (×8): qty 2

## 2016-02-15 MED ORDER — INSULIN ASPART PROT & ASPART (70-30 MIX) 100 UNIT/ML ~~LOC~~ SUSP
26.0000 [IU] | Freq: Every day | SUBCUTANEOUS | Status: DC
  Administered 2016-02-15 – 2016-02-19 (×5): 26 [IU] via SUBCUTANEOUS

## 2016-02-15 NOTE — Consult Note (Signed)
CARDIOLOGY CONSULT NOTE  Patient ID: [name] MRN: [id number] DOB/AGE: July 29, 1975 40 y.o.  Admit date: 02/14/2016 Referring Physician  Vernell Leep, MD Primary Physician:  Merrilee Seashore, MD Reason for Consultation  Pericardial effusion  HPI: [name]  is a 40 y.o. female  With History of chronic diastolic heart failure, chronic dyspnea, uncontrolled hypertension and diabetes mellitus, hyperlipidemia, end-stage renal disease on hemodialysis, Monday-wednesday-Friday.  She has mild coronary artery disease by coronary angiography performed on September 2016.  She has peripheral vascular disease by physical examination, Peripheral neuropathy and Charcot joint involving the feet secondary to uncontrolled diabetes mellitus  She is now admitted to the hospital with abdominal discomfort, abdominal swelling and ascites.  CT scan of the  abdomen had revealed performed today on 02/15/2016 revealed small-to-moderate apical effusion and diffuse coronary calcification with clear lung fields.  Abdominal aorta also showed diffuse calcification.  Bilateral renal atrophy.  No other significant findings were evident except for small-to-moderate ascites.  I was asked to see the patient due to presence of pericardial effusion.  I had seen the patient on 01/14/16 due to abnormal echocardiogram.  On 01/14/2016, LVEF was normal, with grade 2 diastolic dysfunction with severe LVH suggestive of infiltrative cardiomyopathy involving both right ventricle and left ventricle.  Mild to moderate pulmonary hypertension with PA pressure of 60 mmHg and a on 0.6 cm clear circumferential pericardial effusion was evident without hemodynamic compromise.  Hence she was recommended medical therapy only with office visit in 4-6 weeks for follow-up.  I had also discontinued minoxidil due to facial hair growth.  Patient states that she feels much better now since ascites was drained.  3.4 L of clear to serosanguineous fluid was  withdrawn.  Rare WBCs very evident on initial Gram stain.   Past Medical History:  Diagnosis Date  . Chronic anemia    2nd to renal disease  . CIN III (cervical intraepithelial neoplasia grade III) with severe dysplasia    S/P LEEP AND CONE  . Coronary artery disease    Status post cardiac catheterization June 2012 scattered coronary artery disease/atherosclerosis with 70-80% stenosis in a small right PDA.  . Diabetic Charcot's joint disease ([place])   . DM (diabetes mellitus) ([place])    Long-term insulin  . End stage renal disease on dialysis ([place]) 05/02/11   [facility]; M; W, F; last time 05/01/11  . Fracture of 5th metatarsal 2016   Right  . Gastroparesis   . 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 History of abscesses in groins 12/06/2010  . Hyperlipidemia    Hypertriglyceridemia 449 HDL 25  . Hypertension   . Migraines    "just on dialysis days"  . Peripheral neuropathy ([facility])    related to DM  . Peripheral vascular disease ([place])    Tibial occlusive disease evaluated by Dr. Kellie Simmering in August 2011. Medical therapy  . Renal insufficiency    Dialysis since 2012  . Tobacco use disorder    Discontinued March 2012  . Trimalleolar fracture of ankle, closed 02/09/2015   Right     Past Surgical History:  Procedure Laterality Date  . AV FISTULA PLACEMENT  04/2010  . CARDIAC CATHETERIZATION [name] 02/10/2015   Procedure: Left Heart Cath and Coronary Angiography;  Surgeon: Dixie Dials, MD;  Location: [facility];  Service: Cardiovascular;  Laterality: [name];  . CERVICAL BIOPSY  W/ LOOP ELECTRODE EXCISION     h/o  . CERVICAL CONE BIOPSY     h/o  . COLPOSCOPY    .  DILATION AND CURETTAGE OF UTERUS  2009  . ORIF ANKLE FRACTURE Right 02/09/2015  . ORIF ANKLE FRACTURE Right 02/16/2015   Procedure: OPEN REDUCTION INTERNAL FIXATION (ORIF) RIGHT ANKLE FRACTURE;  Surgeon: Altamese Los Banos, MD;  Location: [facility];  Service: Orthopedics;  Laterality: Right;     Family History  Problem Relation Age of Onset  .  Diabetes Mother   . Hypertension Mother   . Diabetes Father   . Hyperlipidemia Father   . Hypertension Father   . Diabetes Sister   . Stroke Maternal Grandmother   . Diabetes Sister   . Breast cancer Maternal Aunt     Age 63's     Social History: Social History   Social History  . Marital status: Married    Spouse name: [name]  . Number of children: [name]  . Years of education: [name]   Occupational History  .  [employer]   Social History Main Topics  . Smoking status: Former Smoker    Packs/day: 0.30    Years: 10.00    Types: Cigarettes    Quit date: 08/05/2011  . Smokeless tobacco: Never Used     Comment: "quit smoking cigarettes 07/2010"  . Alcohol use No  . Drug use: No  . Sexual activity: Yes    Birth control/ protection: Condom   Other Topics Concern  . Not on file   Social History Narrative  . No narrative on file     Prescriptions Prior to Admission  Medication Sig Dispense Refill Last Dose  . atorvastatin (LIPITOR) 40 MG tablet Take 40 mg by mouth daily.   02/13/2016 at Unknown time  . cloNIDine (CATAPRES) 0.3 MG tablet Take 1 tablet (0.3 mg total) by mouth 3 (three) times daily. 90 tablet 0 02/14/2016 at Unknown time  . clopidogrel (PLAVIX) 75 MG tablet Take 75 mg by mouth at bedtime.    02/13/2016 at Unknown time  . HUMALOG MIX 50/50 KWIKPEN (50-50) 100 UNIT/ML Kwikpen Inject 26-28 Units into the skin 2 (two) times daily. 28 in the morning and 26 in the evening.   02/14/2016 at Unknown time  . hydrALAZINE (APRESOLINE) 50 MG tablet Take 1 tablet (50 mg total) by mouth every 8 (eight) hours. 90 tablet 0 02/14/2016 at Unknown time  . isosorbide mononitrate (IMDUR) 60 MG 24 hr tablet Take 60 mg by mouth daily.   02/14/2016 at Unknown time  . labetalol (NORMODYNE) 300 MG tablet Take 1 tablet (300 mg total) by mouth 3 (three) times daily. 270 tablet 3 02/14/2016 at Unknown time  . RENVELA 800 MG tablet Take 4,800 mg by mouth 3 (three) times daily with meals.    02/14/2016 at  Unknown time  . amLODipine (NORVASC) 10 MG tablet Take 1 tablet (10 mg total) by mouth daily. (Patient not taking: Reported on 02/15/2016) 30 tablet 0 Not Taking at Unknown time  . Ferric Citrate (AURYXIA PO) Take 2 tablets by mouth 3 (three) times daily with meals.   unknown at Unknown time    ROS: General: no fevers/chills/night sweats, Positive for fatigue that is chronic Eyes: no blurry vision, diplopia, or amaurosis ENT: no sore throat or hearing loss Resp: no cough, wheezing, or hemoptysis CV: no edema or palpitations GI: Positive for abdominal pain, Swelling that occurred over the past 2 weeks.  Denies nausea, vomiting, diarrhea, or constipation GU: no dysuria, frequency, or hematuria Skin: no rash Neuro: no headache, numbness, tingling, or weakness of extremities Musculoskeletal: no joint pain or swelling Heme: no  bleeding, DVT, or easy bruising Endo: no polydipsia or polyuria   Physical Exam: Blood pressure (!) 166/73, pulse 69, temperature 98.3 F (36.8 C), temperature source Oral, resp. rate 16, height 6\' 4"  (1.93 m), weight 94.9 kg (209 lb 4.8 oz), last menstrual period 09/16/2015, SpO2 97 %.   General appearance: alert, cooperative, appears older than stated age, fatigued and no distress Lungs: clear to auscultation bilaterally  Cardiac: S1 and S2 is normal,  2/6 systolic ejection murmur in the left parasternal region is heard.  No gallop. Chest wall: no tenderness Abdomen: soft, non-tender; bowel sounds normal; no masses,  no organomegaly and mildly distended, ascites present. Extremities: extremities normal, atraumatic, no cyanosis or edema Pulses: Bilateral hair loss present, femoral pulses 2+ with a lateral bruit, popliteal pulse faint bilateral.  Pedal pulses absent bilaterally.  Capillary refill normal. Left forearm AV fistula, functioning. Neurologic: Grossly normal  Labs:   Lab Results  Component Value Date   WBC 8.1 02/14/2016   HGB 9.7 (L) 02/14/2016   HCT  32.5 (L) 02/14/2016   MCV 93.1 02/14/2016   PLT 222 02/14/2016    Recent Labs Lab 02/14/16 1841 02/15/16 0849  NA 137 132*  K 3.7 4.4  CL 94* 90*  CO2 30 29  BUN 18 27*  CREATININE 4.01* 4.74*  CALCIUM 9.0 9.1  PROT 6.2*  --   BILITOT 0.8  --   ALKPHOS 236*  --   ALT 11*  --   AST 12*  --   GLUCOSE 149* 599*    Lipid Panel     Component Value Date/Time   CHOL 117 01/27/2012 0244   TRIG 133 01/27/2012 0244   HDL 28 (L) 01/27/2012 0244   CHOLHDL 4.2 01/27/2012 0244   VLDL 27 01/27/2012 0244   LDLCALC 62 01/27/2012 0244    BNP (last 3 results)  Recent Labs  08/26/15 1433  BNP 3,934.2*    HEMOGLOBIN A1C Lab Results  Component Value Date   HGBA1C 8.8 (H) 02/11/2015   MPG 206 02/11/2015    Recent Labs  08/26/15 1433  TROPONINI 0.05*    Lab Results  Component Value Date   CKTOTAL 84 09/23/2011   CKMB 4.5 (H) 09/23/2011   TROPONINI 0.05 (H) 08/26/2015    EKG 02/15/2016: Normal sinus rhythm at a rate of 69 bpm, left axis deviation, left anterior fascicular block.  Poor R-wave progression, cannot exclude anterior infarct old.  LVH with repolarization abnormality, cannot exclude high lateral ischemia.  Compared to 0/05/2015, no significant changes evident, ST segment changes in V5 and V6 much improved.   Out-patient:  Echocardiogram 12/28/2015: 1. Left ventricle cavity is normal in size. Severe concentric hypertrophy of the left ventricle with speckled pattern suggestive of infiltrative cardiomyopathy. Normal global wall motion. Doppler evidence of grade II (pseudonormal) diastolic dysfunction. Diastolic dysfunction findings suggests elevated LA/LV end diastolic pressure. Calculated EF 64%. 2. Severe bi-atrial enlargement, LA measures 6.3 cm. 3. Right ventricle cavity is normal in size. Moderate concentric hypertrophy of the right ventricle. Speckled pattern suggests infiltrative cardiomyopathy. Normal right ventricular function. 4. Trace aortic  regurgitation. 5. Mild to moderate tricuspid regurgitation. Moderate to severe pulmonary hypertension. Pulmonary artery systolic pressure is estimated at 60 mm Hg. 6. Mild to moderate pulmonic regurgitation. 7. Moderate pericardial effusion with clear circumferential 1.6 cm is seen without hemodynamic compromise. 8. IVC is dilated with poor inspiration collapse consistent with elevated right atrial pressure. Compared to 10/31/14, LVH worse from moderate.   Radiology: Dg Chest  2 View  Result Date: 02/15/2016 CLINICAL DATA:  Patient reports fluid in the abdomen. History of CAD, hypertension, diabetes. History of dialysis. EXAM: CHEST  2 VIEW COMPARISON:  10/25/2015 FINDINGS: The heart is enlarged and stable in configuration. There are no focal consolidations or pleural effusions. No pulmonary edema. IMPRESSION: Stable cardiomegaly. Electronically Signed   By: Nolon Nations M.D.   On: 02/15/2016 13:14   Ct Abdomen Pelvis W Contrast 02/15/2016: 1. Small to moderate volume ascites noted within the abdomen and pelvis. 2. Small to moderate pericardial effusion seen. 3. Diffuse coronary artery calcifications noted. 4. Aortic atherosclerosis. 5. Moderate bilateral renal atrophy, with scattered small bilateral renal cysts. Electronically Signed   By: Garald Balding M.D.   On: 02/15/2016 02:54   ULTRASOUND GUIDED RIGHT LATERAL ABDOMEN PARACENTESIS 02/15/2016: FINDINGS: A total of approximately 3.4 liters of clear light orange fluid was removed. Samples were sent to the laboratory as requested by the clinical team.  Scheduled Meds: . amLODipine  10 mg Oral Daily  . atorvastatin  40 mg Oral q1800  . cloNIDine  0.3 mg Oral TID  . clopidogrel  75 mg Oral QHS  . heparin  5,000 Units Subcutaneous Q8H  . hydrALAZINE  50 mg Oral Q8H  . insulin aspart  0-9 Units Subcutaneous TID WC  . insulin aspart protamine- aspart  26 Units Subcutaneous Q supper  . insulin aspart protamine- aspart  28 Units Subcutaneous Q  breakfast  . isosorbide mononitrate  60 mg Oral Daily  . labetalol  300 mg Oral TID  . sevelamer carbonate  800 mg Oral TID WC  . sucroferric oxyhydroxide  500 mg Oral TID WC   Continuous Infusions:  PRN Meds:.acetaminophen, zolpidem  ASSESSMENT AND PLAN:  1.  Pericardial effusion, moderate sized without  hemodynamic consequence, patient in fact is hypertensive.   2.  Ascites probably secondary to congestive heart failure, most probably combination of biventricular failure with acute on chronic diastolic heart failure and pulmonary hypertension with RV failure.  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  Hypertension that is chronic and uncontrolled may also lead to this clinical presentation. 3.  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 4. Hypertension, uncontrolled 5.  Hyperlipidemia  Recommendation: Extremely difficult situation  With patient making her own decisions regarding hypertension control.  With patient being on hemodialysis and patient having grade 2 diastolic dysfunction may quickly lead to grade 3 or grade 4 diastolic dysfunction.  I will have heart failure team to get involved in her care as she may need advanced therapies are advanced therapeutics including myocardial biopsy.  We should certainly consider cardiac MRI to confirm the diagnosis of infiltrative cardiomyopathy.  It is to be seen while she is in the hospital if her blood pressure is much better improved and controlled with the present medical regimen suggesting noncompliance is an issue.  I'll continue to follow the patient with you. Echocardiogram has been reordered and is pending at this time.  Adrian Prows, MD 02/15/2016, 9:47 PM [facility]. [place] Pager: [phone] Office: [phone] If no answer Cell [phone]

## 2016-02-15 NOTE — H&P (Signed)
History and Physical    [name] [id number] DOB: [birth year] DOA: 02/14/2016   PCP: Merrilee Seashore, MD Chief Complaint:  Chief Complaint  Patient presents with  . Abdominal Pain    HPI: [name] is a 40 y.o. female with medical history significant of ESRD dialysis MWF, last went yesterday for a full 4 hour session she states.  She presents to the ED with gradually worsening, moderate, generalized abdominal distention with pain onset 1.5 weeks ago.  Pain is worse in bilateral suprapubic region.  Reports associated chills, nausea, constipation.  Last BM 3 days ago.  No N/V/D or fever.  ED Course: Has moderate ascites in abdomen, mild-moderate pericardial effusion.  Review of Systems: As per HPI otherwise 10 point review of systems negative.    Past Medical History:  Diagnosis Date  . Chronic anemia    2nd to renal disease  . CIN III (cervical intraepithelial neoplasia grade III) with severe dysplasia    S/P LEEP AND CONE  . Coronary artery disease    Status post cardiac catheterization June 2012 scattered coronary artery disease/atherosclerosis with 70-80% stenosis in a small right PDA.  . Diabetic Charcot's joint disease ([place])   . DM (diabetes mellitus) ([place])    Long-term insulin  . End stage renal disease on dialysis ([place]) 05/02/11   [facility]; M; W, F; last time 05/01/11  . Fracture of 5th metatarsal 2016   Right  . Gastroparesis   . 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 History of abscesses in groins 12/06/2010  . Hyperlipidemia    Hypertriglyceridemia 449 HDL 25  . Hypertension   . Migraines    "just on dialysis days"  . Peripheral neuropathy (HCC)    related to DM  . Peripheral vascular disease ([place])    Tibial occlusive disease evaluated by Dr. Kellie Simmering in August 2011. Medical therapy  . Renal insufficiency    Dialysis since 2012  . Tobacco use disorder    Discontinued March 2012  . Trimalleolar fracture of ankle, closed 02/09/2015   Right    Past Surgical  History:  Procedure Laterality Date  . AV FISTULA PLACEMENT  04/2010  . CARDIAC CATHETERIZATION [name] 02/10/2015   Procedure: Left Heart Cath and Coronary Angiography;  Surgeon: Dixie Dials, MD;  Location: [facility];  Service: Cardiovascular;  Laterality: [name];  . CERVICAL BIOPSY  W/ LOOP ELECTRODE EXCISION     h/o  . CERVICAL CONE BIOPSY     h/o  . COLPOSCOPY    . DILATION AND CURETTAGE OF UTERUS  2009  . ORIF ANKLE FRACTURE Right 02/09/2015  . ORIF ANKLE FRACTURE Right 02/16/2015   Procedure: OPEN REDUCTION INTERNAL FIXATION (ORIF) RIGHT ANKLE FRACTURE;  Surgeon: Altamese Opdyke West, MD;  Location: [facility];  Service: Orthopedics;  Laterality: Right;     reports that she quit smoking about 4 years ago. Her smoking use included Cigarettes. She has a 3.00 pack-year smoking history. She has never used smokeless tobacco. She reports that she does not drink alcohol or use drugs.  Allergies  Allergen Reactions  . Cephalexin Other (See Comments)    Reaction unknown  . Sulfamethoxazole-Trimethoprim Other (See Comments)    Thrush    Family History  Problem Relation Age of Onset  . Diabetes Mother   . Hypertension Mother   . Diabetes Father   . Hyperlipidemia Father   . Hypertension Father   . Diabetes Sister   . Stroke Maternal Grandmother   . Diabetes Sister   .  Breast cancer Maternal Aunt     Age 60's      Prior to Admission medications   Medication Sig Start Date End Date Taking? Authorizing Provider  atorvastatin (LIPITOR) 40 MG tablet Take 40 mg by mouth daily.   Yes Historical Provider, MD  cloNIDine (CATAPRES) 0.3 MG tablet Take 1 tablet (0.3 mg total) by mouth 3 (three) times daily. 11/01/14  Yes Debbe Odea, MD  clopidogrel (PLAVIX) 75 MG tablet Take 75 mg by mouth at bedtime.    Yes Historical Provider, MD  HUMALOG MIX 50/50 KWIKPEN (50-50) 100 UNIT/ML Kwikpen Inject 26-28 Units into the skin 2 (two) times daily. 28 in the morning and 26 in the evening. 04/29/14  Yes  Historical Provider, MD  hydrALAZINE (APRESOLINE) 50 MG tablet Take 1 tablet (50 mg total) by mouth every 8 (eight) hours. 11/01/14  Yes Debbe Odea, MD  isosorbide mononitrate (IMDUR) 60 MG 24 hr tablet Take 60 mg by mouth daily.   Yes Historical Provider, MD  labetalol (NORMODYNE) 300 MG tablet Take 1 tablet (300 mg total) by mouth 3 (three) times daily. 11/01/14  Yes Debbe Odea, MD  RENVELA 800 MG tablet Take 800 mg by mouth 3 (three) times daily with meals.  04/17/13  Yes Historical Provider, MD  amLODipine (NORVASC) 10 MG tablet Take 1 tablet (10 mg total) by mouth daily. Patient not taking: Reported on 02/15/2016 07/05/13   Minus Breeding, MD    Physical Exam: Vitals:   02/15/16 0315 02/15/16 0345 02/15/16 0415 02/15/16 0430  BP: 196/80 182/100 (!) 201/83 199/82  Pulse: 85 82 83 90  Resp:  16  18  Temp:      TempSrc:      SpO2: 90% 94% 96% 90%  Weight:          Constitutional: NAD, calm, comfortable Eyes: PERRL, lids and conjunctivae normal ENMT: Mucous membranes are moist. Posterior pharynx clear of any exudate or lesions.Normal dentition.  Neck: normal, supple, no masses, no thyromegaly Respiratory: clear to auscultation bilaterally, no wheezing, no crackles. Normal respiratory effort. No accessory muscle use.  Cardiovascular: Regular rate and rhythm, no murmurs / rubs / gallops. No extremity edema. 2+ pedal pulses. No carotid bruits.  Abdomen: Distention, tenderness, no rebound, no guarding Musculoskeletal: no clubbing / cyanosis. No joint deformity upper and lower extremities. Good ROM, no contractures. Normal muscle tone.  Skin: no rashes, lesions, ulcers. No induration Neurologic: CN 2-12 grossly intact. Sensation intact, DTR normal. Strength 5/5 in all 4.  Psychiatric: Normal judgment and insight. Alert and oriented x 3. Normal mood.    Labs on Admission: I have personally reviewed following labs and imaging studies  CBC:  Recent Labs Lab 02/14/16 1841  WBC 8.1    HGB 9.7*  HCT 32.5*  MCV 93.1  PLT [id number]   Basic Metabolic Panel:  Recent Labs Lab 02/14/16 1841  NA 137  K 3.7  CL 94*  CO2 30  GLUCOSE 149*  BUN 18  CREATININE 4.01*  CALCIUM 9.0   GFR: Estimated Creatinine Clearance: 24.2 mL/min (by C-G formula based on SCr of 4.01 mg/dL (H)). Liver Function Tests:  Recent Labs Lab 02/14/16 1841  AST 12*  ALT 11*  ALKPHOS 236*  BILITOT 0.8  PROT 6.2*  ALBUMIN 2.8*    Recent Labs Lab 02/14/16 1841  LIPASE 30   No results for input(s): AMMONIA in the last 168 hours. Coagulation Profile: No results for input(s): INR, PROTIME in the last 168 hours. Cardiac Enzymes: No  results for input(s): CKTOTAL, CKMB, CKMBINDEX, TROPONINI in the last 168 hours. BNP (last 3 results) No results for input(s): PROBNP in the last 8760 hours. HbA1C: No results for input(s): HGBA1C in the last 72 hours. CBG: No results for input(s): GLUCAP in the last 168 hours. Lipid Profile: No results for input(s): CHOL, HDL, LDLCALC, TRIG, CHOLHDL, LDLDIRECT in the last 72 hours. Thyroid Function Tests: No results for input(s): TSH, T4TOTAL, FREET4, T3FREE, THYROIDAB in the last 72 hours. Anemia Panel: No results for input(s): VITAMINB12, FOLATE, FERRITIN, TIBC, IRON, RETICCTPCT in the last 72 hours. Urine analysis:    Component Value Date/Time   COLORURINE YELLOW 02/08/2011 1812   APPEARANCEUR CLEAR 02/08/2011 1812   LABSPEC 1.016 02/08/2011 1812   PHURINE 8.0 02/08/2011 1812   GLUCOSEU 250 (A) 02/08/2011 1812   HGBUR NEGATIVE 02/08/2011 1812   BILIRUBINUR NEGATIVE 02/08/2011 1812   KETONESUR NEGATIVE 02/08/2011 1812   PROTEINUR >300 (A) 02/08/2011 1812   UROBILINOGEN 0.2 02/08/2011 1812   NITRITE NEGATIVE 02/08/2011 1812   LEUKOCYTESUR NEGATIVE 02/08/2011 1812   Sepsis Labs: @LABRCNTIP (procalcitonin:4,lacticidven:4) )No results found for this or any previous visit (from the past 240 hour(s)).   Radiological Exams on Admission: Ct Abdomen  Pelvis W Contrast  Result Date: 02/15/2016 CLINICAL DATA:  Acute onset of generalized abdominal distention, nausea and vomiting. Abdominal pain. Initial encounter. EXAM: CT ABDOMEN AND PELVIS WITH CONTRAST TECHNIQUE: Multidetector CT imaging of the abdomen and pelvis was performed using the standard protocol following bolus administration of intravenous contrast. CONTRAST:  100 mL ISOVUE-300 IOPAMIDOL (ISOVUE-300) INJECTION 61% COMPARISON:  MRI of the lumbar spine performed 11/02/2015, and CT of the abdomen and pelvis from 05/24/2014 FINDINGS: Lower chest: A small to moderate pericardial effusion is noted. Diffuse coronary artery calcifications are seen. The visualized lung bases appear grossly clear. Hepatobiliary: The liver is grossly unremarkable in appearance. The gallbladder is grossly unremarkable, though difficult to fully assess given adjacent ascites. The common bile duct remains normal in caliber. Pancreas: The pancreas is within normal limits. Spleen: The spleen is unremarkable in appearance. Adrenals/Urinary Tract: The adrenal glands are unremarkable in appearance. There is moderate renal atrophy bilaterally, with scattered small bilateral renal cysts and underlying vascular calcifications at both renal hila. There is no evidence of hydronephrosis. Stomach/Bowel: The stomach is unremarkable in appearance. The small bowel is within normal limits. The appendix is normal in caliber, without evidence of appendicitis. The colon is unremarkable in appearance. Vascular/Lymphatic: Relatively diffuse calcification is seen along the abdominal aorta and its branches. The inferior vena cava is grossly unremarkable. No pelvic sidewall lymphadenopathy is seen. There is no evidence of retroperitoneal or inguinal lymphadenopathy. Reproductive: The bladder is mildly distended and within normal limits. The uterus is grossly unremarkable in appearance. The ovaries are relatively symmetric. No suspicious adnexal masses  are seen. Other: Small to moderate volume ascites is seen within the abdomen and pelvis. No additional soft tissue abnormalities are seen. Musculoskeletal: No acute osseous abnormalities are identified. The visualized musculature is unremarkable in appearance. IMPRESSION: 1. Small to moderate volume ascites noted within the abdomen and pelvis. 2. Small to moderate pericardial effusion seen. 3. Diffuse coronary artery calcifications noted. 4. Aortic atherosclerosis. 5. Moderate bilateral renal atrophy, with scattered small bilateral renal cysts. Electronically Signed   By: Garald Balding M.D.   On: 02/15/2016 02:54    EKG: Independently reviewed.  Assessment/Plan Principal Problem:   Ascites Active Problems:   Essential hypertension, benign   End stage renal disease on dialysis ([place])  DM (diabetes mellitus), type 2 with renal complications ([place])    1. Ascites - 1. Getting IR to tap and send fluid to lab for analysis. 2. LFTs appear normal 3. No obvious neoplasm / mass on CT scan 4. Not febrile, no WBC elevation, 0 SIRS criteria 2. ESRD - left voice mail with nephrology consult line in case she stays longer than just today 3. HTN - continue home meds 4. DM2 - continue home BID 50/50, will add CBG checks AC/HS   DVT prophylaxis: Heparin [place] Code Status: Full Family Communication: No family in room Consults called: None Admission status: Admit to obs   Katalina Magri, [place] [facility] Pager [phone] from 7PM-7AM  If 7AM-7PM, please contact the day physician for the patient [email or web address] Password [email or web address]  02/15/2016, 4:43 AM

## 2016-02-15 NOTE — ED Notes (Signed)
Dr. Gardner at bedside 

## 2016-02-15 NOTE — Progress Notes (Signed)
Inpatient Diabetes Program Recommendations  AACE/ADA: New Consensus Statement on Inpatient Glycemic Control (2015)  Target Ranges:  Prepandial:   less than 140 mg/dL      Peak postprandial:   less than 180 mg/dL (1-2 hours)      Critically ill patients:  140 - 180 mg/dL   Results for BONI, DOWNIE (MRN [id number]) as of 02/15/2016 11:56  Ref. Range 02/15/2016 08:23  Glucose-Capillary Latest Ref Range: 65 - 99 mg/dL 599 [facility])  Results for HERLENE, REGALBUTO (MRN [id number]) as of 02/15/2016 11:56  Ref. Range 02/14/2016 18:41 02/15/2016 08:49  Glucose Latest Ref Range: 65 - 99 mg/dL 149 (H) 599 (HH)    Review of Glycemic Control  Diabetes history: DM2 Outpatient Diabetes medications: Humalog 50/50 28 units QAM, Huamlog 50/50 26 units QPM Current orders for Inpatient glycemic control: Novolog 70/30 28 units QAM, Novolog 70/30 26 units QPM  Inpatient Diabetes Program Recommendations: Correction (SSI): Please consider ordering Novolog correction scale ACHS.  NOTE: Glucose 599 mg/dl this morning. Noted patient has already been given Novolog 70/30 28 units this morning at 11:36 am.  Thanks, Barnie Alderman, RN, MSN, CDE Diabetes Coordinator Inpatient Diabetes Program [phone] (Team Pager from [place] to [place]) [phone] (AP office) [phone] [facility] office) [phone] [facility] office)

## 2016-02-15 NOTE — ED Notes (Signed)
Pt states she has not had her night time HTN meds

## 2016-02-15 NOTE — Progress Notes (Signed)
PROGRESS NOTE  [name]  [id number] DOB: 1976-03-18  DOA: 02/14/2016 PCP: Merrilee Seashore, MD   Brief Narrative:  40 year old female with PMH of ESRD on MWF HD, CIN III s/p LEEP, CAD status post cardiac 2012, type II DM/IDDM, HLD, HTN, PAD,? Chronic CHF (follows with Dr. Einar Gip) presented to [facility] ED on 02/14/20 with gradually worsening abdominal distention, fluctuating appetite, approximately 20 pound weight loss since April 2017, no reported change in bowel habits, compliant with dialysis. In ED noted to have clinical ascites and CT abdomen with contrast revealed small to moderate ascites and pericardial effusion. She denies dyspnea or chest pain. Admitted for further evaluation. Nephrology consulted.   Assessment & Plan:   Principal Problem:   Ascites Active Problems:   Essential hypertension, benign   End stage renal disease on dialysis ([place])   DM (diabetes mellitus), type 2 with renal complications (HCC)   Ascites - Unclear etiology. No peripheral edema - DD: ESRD/uremia with inadequate HD, decompensated CHF/right heart failure, infectious, inflammatory, malignancy versus other etiologies. - CT abdomen report as below was appreciated. Liver appears unremarkable but will check INR. - Patient states that she had an echo with her primary cardiologist a month ago, we will try to obtain report. May consider repeating echo. - IR consulted for diagnostic and therapeutic paracentesis. Follow results. - Nephrology consulted to consider volume management across HD.  Pericardial effusion - Management as outlined in ascites. Repeat echo. No clinical features of cardiac tamponade.  ESRD on MWF HD - Nephrology consulted for dialysis needs.  Uncontrolled type II DM/IDDM - Patient missed last night's dose of 70/30 insulin. CBG/blood glucose this morning 599. Ensure she gets her home dose of insulin's and monitor CBGs closely. May consider adding SSI. Check A1c.  Essential  hypertension - Uncontrolled. Continue with amlodipine, clonidine, hydralazine, labetalol and Imdur. When necessary IV hydralazine. - Polypharmacy suggests refractory hypertension.  Hyperlipidemia - Continue statins.  CAD - Continue Plavix, statins. Asymptomatic of chest pain.  Weight loss - Unclear etiology.? Related to ascites.  Anemia - may be related to ESRD> follow periodically.    DVT prophylaxis: Heparin Code Status: Full Family Communication: None at bedside Disposition Plan: DC home when medically improved and pending evaluation.   Consultants:   Nephrology  IR  Procedures:   None  Antimicrobials:   None    Subjective: Progressive abdominal distention 4 weeks. Recent lower abdominal discomfort. No change in bowel habits. Fluctuating and reduced appetite. Approximately 15-20 pound weight loss since April 2017. Denies tobacco or alcohol abuse. No dyspnea or chest pain reported.   Objective:  Vitals:   02/15/16 0500 02/15/16 0515 02/15/16 0624 02/15/16 0631  BP: (!) 215/89 (!) 209/85  (!) 194/84  Pulse: 85 83  79  Resp: 16   17  Temp:    99.5 F (37.5 C)  TempSrc:    Oral  SpO2: 99% 97%  95%  Weight:   94.9 kg (209 lb 4.8 oz)   Height:   6\' 4"  (1.93 m)    No intake or output data in the 24 hours ending 02/15/16 1145 Filed Weights   02/14/16 1837 02/15/16 0624  Weight: 94.8 kg (209 lb) 94.9 kg (209 lb 4.8 oz)    Examination:  General exam: Pleasant young female lying comfortably supine in bed. Examined along with the nephrologist.  Respiratory system: Clear to auscultation. Respiratory effort normal. Cardiovascular system: S1 & S2 heard, RRR. No JVD, murmurs, rubs, gallops or clicks. No pedal edema. Gastrointestinal system:  Abdomen is mild- moderately distended, soft and nontender. No organomegaly or masses felt. Normal bowel sounds heard. Ascites + +. Not tense. No fluid thrill.  Central nervous system: Alert and oriented. No focal  neurological deficits. Extremities: Symmetric 5 x 5 power. Skin: No rashes, lesions or ulcers Psychiatry: Judgement and insight appear normal. Mood & affect appropriate.     Data Reviewed: I have personally reviewed following labs and imaging studies  CBC:  Recent Labs Lab 02/14/16 1841  WBC 8.1  HGB 9.7*  HCT 32.5*  MCV 93.1  PLT [id number]   Basic Metabolic Panel:  Recent Labs Lab 02/14/16 1841 02/15/16 0849  NA 137 132*  K 3.7 4.4  CL 94* 90*  CO2 30 29  GLUCOSE 149* 599*  BUN 18 27*  CREATININE 4.01* 4.74*  CALCIUM 9.0 9.1   GFR: Estimated Creatinine Clearance: 20.5 mL/min (by C-G formula based on SCr of 4.74 mg/dL (H)). Liver Function Tests:  Recent Labs Lab 02/14/16 1841  AST 12*  ALT 11*  ALKPHOS 236*  BILITOT 0.8  PROT 6.2*  ALBUMIN 2.8*    Recent Labs Lab 02/14/16 1841  LIPASE 30   No results for input(s): AMMONIA in the last 168 hours. Coagulation Profile: No results for input(s): INR, PROTIME in the last 168 hours. Cardiac Enzymes: No results for input(s): CKTOTAL, CKMB, CKMBINDEX, TROPONINI in the last 168 hours. BNP (last 3 results) No results for input(s): PROBNP in the last 8760 hours. HbA1C: No results for input(s): HGBA1C in the last 72 hours. CBG:  Recent Labs Lab 02/15/16 0823  GLUCAP 599*   Lipid Profile: No results for input(s): CHOL, HDL, LDLCALC, TRIG, CHOLHDL, LDLDIRECT in the last 72 hours. Thyroid Function Tests: No results for input(s): TSH, T4TOTAL, FREET4, T3FREE, THYROIDAB in the last 72 hours. Anemia Panel: No results for input(s): VITAMINB12, FOLATE, FERRITIN, TIBC, IRON, RETICCTPCT in the last 72 hours.  Sepsis Labs:  Recent Labs Lab 02/15/16 0021  LATICACIDVEN 0.90    No results found for this or any previous visit (from the past 240 hour(s)).       Radiology Studies: Ct Abdomen Pelvis W Contrast  Result Date: 02/15/2016 CLINICAL DATA:  Acute onset of generalized abdominal distention, nausea and  vomiting. Abdominal pain. Initial encounter. EXAM: CT ABDOMEN AND PELVIS WITH CONTRAST TECHNIQUE: Multidetector CT imaging of the abdomen and pelvis was performed using the standard protocol following bolus administration of intravenous contrast. CONTRAST:  100 mL ISOVUE-300 IOPAMIDOL (ISOVUE-300) INJECTION 61% COMPARISON:  MRI of the lumbar spine performed 11/02/2015, and CT of the abdomen and pelvis from 05/24/2014 FINDINGS: Lower chest: A small to moderate pericardial effusion is noted. Diffuse coronary artery calcifications are seen. The visualized lung bases appear grossly clear. Hepatobiliary: The liver is grossly unremarkable in appearance. The gallbladder is grossly unremarkable, though difficult to fully assess given adjacent ascites. The common bile duct remains normal in caliber. Pancreas: The pancreas is within normal limits. Spleen: The spleen is unremarkable in appearance. Adrenals/Urinary Tract: The adrenal glands are unremarkable in appearance. There is moderate renal atrophy bilaterally, with scattered small bilateral renal cysts and underlying vascular calcifications at both renal hila. There is no evidence of hydronephrosis. Stomach/Bowel: The stomach is unremarkable in appearance. The small bowel is within normal limits. The appendix is normal in caliber, without evidence of appendicitis. The colon is unremarkable in appearance. Vascular/Lymphatic: Relatively diffuse calcification is seen along the abdominal aorta and its branches. The inferior vena cava is grossly unremarkable. No pelvic sidewall lymphadenopathy  is seen. There is no evidence of retroperitoneal or inguinal lymphadenopathy. Reproductive: The bladder is mildly distended and within normal limits. The uterus is grossly unremarkable in appearance. The ovaries are relatively symmetric. No suspicious adnexal masses are seen. Other: Small to moderate volume ascites is seen within the abdomen and pelvis. No additional soft tissue  abnormalities are seen. Musculoskeletal: No acute osseous abnormalities are identified. The visualized musculature is unremarkable in appearance. IMPRESSION: 1. Small to moderate volume ascites noted within the abdomen and pelvis. 2. Small to moderate pericardial effusion seen. 3. Diffuse coronary artery calcifications noted. 4. Aortic atherosclerosis. 5. Moderate bilateral renal atrophy, with scattered small bilateral renal cysts. Electronically Signed   By: Garald Balding M.D.   On: 02/15/2016 02:54        Scheduled Meds: . amLODipine  10 mg Oral Daily  . atorvastatin  40 mg Oral q1800  . cloNIDine  0.3 mg Oral TID  . clopidogrel  75 mg Oral QHS  . heparin  5,000 Units Subcutaneous Q8H  . hydrALAZINE  50 mg Oral Q8H  . insulin aspart protamine- aspart  26 Units Subcutaneous Q supper  . insulin aspart protamine- aspart  28 Units Subcutaneous Q breakfast  . isosorbide mononitrate  60 mg Oral Daily  . labetalol  300 mg Oral TID  . sevelamer carbonate  800 mg Oral TID WC   Continuous Infusions:    LOS: 0 days    Time spent: 45 minutes.    [facility], MD [facility] Pager [phone]  If 7PM-7AM, please contact night-coverage [email or web address] Password [email or web address] 02/15/2016, 11:45 AM

## 2016-02-15 NOTE — Progress Notes (Signed)
Received report from [place], RN in ED for transfer of pt to [phone].

## 2016-02-15 NOTE — Progress Notes (Signed)
Addendum  - Discussed with patient's primary cardiologist, Dr. Einar Gip:  - Most recent echo 12/28/15 showed 1.6 cm diameter circumferential pericardial effusion without features of hemodynamic compromise. Echo also suggested some speckled pattern and DD includes amyloidosis, restrictive cardiomyopathy and could cause ascites and pericardial effusion. Clinically not suggestive of constrictive pericarditis. - Dr. Einar Gip will formally consult and make recommendations.  Vernell Leep, MD, FACP, FHM. [facility] Pager [phone]  If 7PM-7AM, please contact night-coverage [email or web address] Password [email or web address] 02/15/2016, 12:18 PM

## 2016-02-15 NOTE — Consult Note (Addendum)
Renal Service Consult Note [facility]  [name] 02/15/2016 Roney Jaffe D Requesting Physician:  Dr. Algis Liming  Reason for Consult:  ESRD pt with abd pain and ascites HPI: The patient is a 40 y.o. year-old with hx of HTN, LVH, DM , gastroparesis, CAD (but no stents/ surg/ intervention) and ESRD on HD. She presents with abd pain and swelling for 3-4 weeks.  She has had poor appetite for 2-3 mos and has lost about 20 lbs.  Hx of CIN III cervical dysplasia in the past.  Had CT abd on admission showing ascites but normal liver w/o lesions and no mass/ nodules/ studding etc to suggest intra-abd malig process. Panc/ spleen/ adrenals normal as well. No LAN.  Asked to see for HD. She goes to all her HD session and stays on full Rx.    See cardiologist for "big heart, stiff", had heart cath once but no sig dz per pt.  No hx stents, no hx "CHF" that she is aware of.  No hx heavy etoh , hepatitis/ jaundice in her pt.  Says she has had DM since age 45-13, not sure type 1 or 2.  Never had abd swelling like this until the last couple of months.  No n/v/d.    Grew up in [place], went to HS at [employer].  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 Is on Tx list.  Lives w husband in [place].  Only PSH is AVF and R ankle surgery.    ROS  denies CP  no joint pain   no HA  no blurry vision  no rash  no diarrhea  no nausea/ vomiting   Past Medical History  Past Medical History:  Diagnosis Date  . Chronic anemia    2nd to renal disease  . CIN III (cervical intraepithelial neoplasia grade III) with severe dysplasia    S/P LEEP AND CONE  . Coronary artery disease    Status post cardiac catheterization June 2012 scattered coronary artery disease/atherosclerosis with 70-80% stenosis in a small right PDA.  . Diabetic Charcot's joint disease ([place])   . DM (diabetes mellitus) ([place])    Long-term insulin  . End stage renal disease on dialysis ([place]) 05/02/11   [facility]; M; W, F; last time 05/01/11  . Fracture of 5th metatarsal 2016   Right  . Gastroparesis   . 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 History of abscesses in groins 12/06/2010  . Hyperlipidemia    Hypertriglyceridemia 449 HDL 25  . Hypertension   . Migraines    "just on dialysis days"  . Peripheral neuropathy (H[facility])    related to DM  . Peripheral vascular disease ([place])    Tibial occlusive disease evaluated by Dr. Kellie Simmering in August 2011. Medical therapy  . Renal insufficiency    Dialysis since 2012  . Tobacco use disorder    Discontinued March 2012  . Trimalleolar fracture of ankle, closed 02/09/2015   Right   Past Surgical History  Past Surgical History:  Procedure Laterality Date  . AV FISTULA PLACEMENT  04/2010  . CARDIAC CATHETERIZATION [name] 02/10/2015   Procedure: Left Heart Cath and Coronary Angiography;  Surgeon: Dixie Dials, MD;  Location: [facility];  Service: Cardiovascular;  Laterality: [name];  . CERVICAL BIOPSY  W/ LOOP ELECTRODE EXCISION     h/o  . CERVICAL CONE BIOPSY     h/o  . COLPOSCOPY    . DILATION AND CURETTAGE OF UTERUS  2009  . ORIF ANKLE FRACTURE Right 02/09/2015  . ORIF ANKLE FRACTURE Right 02/16/2015   Procedure: OPEN REDUCTION INTERNAL FIXATION (ORIF) RIGHT ANKLE FRACTURE;  Surgeon: Altamese Chattooga, MD;  Location: [facility];  Service: Orthopedics;  Laterality: Right;   Family History  Family History  Problem Relation Age of Onset  . Diabetes Mother   . Hypertension Mother   . Diabetes Father   . Hyperlipidemia Father   . Hypertension Father   . Diabetes Sister   . Stroke Maternal Grandmother   . Diabetes Sister   . Breast cancer Maternal Aunt     Age 64's   Social History  reports that she quit smoking about 4 years ago. Her smoking use included Cigarettes. She has a 3.00 pack-year smoking history. She has never used smokeless tobacco. She reports that she does not drink alcohol or use drugs. Allergies  Allergies  Allergen Reactions  . Cephalexin  Other (See Comments)    Reaction unknown  . Sulfamethoxazole-Trimethoprim Other (See Comments)    Thrush   Home medications Prior to Admission medications   Medication Sig Start Date End Date Taking? Authorizing Provider  atorvastatin (LIPITOR) 40 MG tablet Take 40 mg by mouth daily.   Yes Historical Provider, MD  cloNIDine (CATAPRES) 0.3 MG tablet Take 1 tablet (0.3 mg total) by mouth 3 (three) times daily. 11/01/14  Yes Debbe Odea, MD  clopidogrel (PLAVIX) 75 MG tablet Take 75 mg by mouth at bedtime.    Yes Historical Provider, MD  HUMALOG MIX 50/50 KWIKPEN (50-50) 100 UNIT/ML Kwikpen Inject 26-28 Units into the skin 2 (two) times daily. 28 in the morning and 26 in the evening. 04/29/14  Yes Historical Provider, MD  hydrALAZINE (APRESOLINE) 50 MG tablet Take 1 tablet (50 mg total) by mouth every 8 (eight) hours. 11/01/14  Yes Debbe Odea, MD  isosorbide mononitrate (IMDUR) 60 MG 24 hr tablet Take 60 mg by mouth daily.   Yes Historical Provider, MD  labetalol (NORMODYNE) 300 MG tablet Take 1 tablet (300 mg total) by mouth 3 (three) times daily. 11/01/14  Yes Debbe Odea, MD  RENVELA 800 MG tablet Take 800 mg by mouth 3 (three) times daily with meals.  04/17/13  Yes Historical Provider, MD  amLODipine (NORVASC) 10 MG tablet Take 1 tablet (10 mg total) by mouth daily. Patient not taking: Reported on 02/15/2016 07/05/13   Minus Breeding, MD   Liver Function Tests  Recent Labs Lab 02/14/16 1841  AST 12*  ALT 11*  ALKPHOS 236*  BILITOT 0.8  PROT 6.2*  ALBUMIN 2.8*    Recent Labs Lab 02/14/16 1841  LIPASE 30   CBC  Recent Labs Lab 02/14/16 1841  WBC 8.1  HGB 9.7*  HCT 32.5*  MCV 93.1  PLT [id number]   Basic Metabolic Panel  Recent Labs Lab 02/14/16 1841 02/15/16 0849  NA 137 132*  K 3.7 4.4  CL 94* 90*  CO2 30 29  GLUCOSE 149* 599*  BUN 18 27*  CREATININE 4.01* 4.74*  CALCIUM 9.0 9.1   Iron/TIBC/Ferritin/ %Sat    Component Value Date/Time   IRON 124 10/31/2014 0405    TIBC 157 (L) 10/31/2014 0405   FERRITIN 780 (H) 10/31/2014 0405   IRONPCTSAT 79 (H) 10/31/2014 0405    Vitals:   02/15/16 1236 02/15/16 1244 02/15/16 1251 02/15/16 1258  BP: (!) 198/90 (!) 203/89 (!) 207/88 (!) 196/81  Pulse:      Resp:      Temp:  TempSrc:      SpO2:      Weight:      Height:       Exam Gen alert, no distress, in pain (abdomen), moves about the bed, uncomfortable No rash, cyanosis or gangrene Sclera anicteric, throat clear  +JVD to angle of jaw Chest clear bilat RRR soft SEM, no RG Abd distended w marked ascites, nontender, +BS, liver down 4 cm, no SM GU defer MS no joint effusions or deformity Ext no LE or UE edema / no wounds or ulcers Neuro is alert, Ox 3 , nf    Dialysis: MWF [facility]  4h 32min   95.5kg  2/2 bath  P2   Hep 3000  AVF Hect 6 ug Mircera 225 ug q 2wk last 9/20 Auryxia 2 tab ac Sensipar 60/d Ca 10, P 7.2 Hb 9.3   Assessment: 1. Abd pain / new onset ascites - CT neg for malignancy and liver doesn't look cirrhotic.  Alb is somewhat low 2.8 which could be from malnutrition and/or liver disease. Per last ECHO has hx of diast dysfunction and some degree of pulm HTN, hx of refractory HTN it appears.  Check INR.  Have d/w primary, cardiology to be consulted.  She is not uremic, completes all her HD sessions.  Would be concerned about hemodynamic, poss RHF, etc., vs liver disease.   2. ESRD HD MWF 3. Wt loss 4. HTN - on mult BP meds and BP's still quite high. Will try to lower vol while here 5. Vol - no gross overload on exam, lungs clear, only ascites 6. 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

## 2016-02-15 NOTE — Care Management Note (Signed)
Case Management Note  Patient Details  Name: [name] MRN: [id number] Date of Birth: 1976/02/20  Subjective/Objective:      Presents with ABD pain/ Ascites, hx of  ESRD dialysis MWF ( [facility]), DM, HTN. From home with husband. Independent with ADL's PTA. Uses a cane with ambulation.  PCP: Merrilee Seashore   Action/Plan: - paracentesis per IR 02/15/2016....d/c to home when medically stable. CM to f/u with disposition needs Expected Discharge Date:               Expected Discharge Plan:  Home/Self Care (Lives with husband, [name])  In-House Referral:     Discharge planning Services  CM Consult  Post Acute Care Choice:    Choice offered to:     DME Arranged:   (Owns cane) DME Agency:     HH Arranged:    HH Agency:     Status of Service:  In process, will continue to follow  If discussed at Long Length of Stay Meetings, dates discussed:    Additional Comments:  Sharin Mons, RN 02/15/2016, 11:26 AM

## 2016-02-15 NOTE — Procedures (Signed)
Successful US guided paracentesis from right lateral abdomen.  Yielded 3.4 liters of clear light orange fluid.  No immediate complications.  Pt tolerated well.   Specimen was sent for labs.  WENDY S BLAIR PA-C 02/15/2016 1:10 PM

## 2016-02-16 DIAGNOSIS — N186 End stage renal disease: Secondary | ICD-10-CM

## 2016-02-16 DIAGNOSIS — I110 Hypertensive heart disease with heart failure: Secondary | ICD-10-CM

## 2016-02-16 DIAGNOSIS — I10 Essential (primary) hypertension: Secondary | ICD-10-CM

## 2016-02-16 DIAGNOSIS — R188 Other ascites: Secondary | ICD-10-CM

## 2016-02-16 DIAGNOSIS — Z992 Dependence on renal dialysis: Secondary | ICD-10-CM

## 2016-02-16 LAB — GLUCOSE, CAPILLARY
GLUCOSE-CAPILLARY: 353 mg/dL — AB (ref 65–99)
Glucose-Capillary: 86 mg/dL (ref 65–99)
Glucose-Capillary: 151 mg/dL — ABNORMAL HIGH (ref 65–99)
Glucose-Capillary: 150 mg/dL — ABNORMAL HIGH (ref 65–99)

## 2016-02-16 LAB — RENAL FUNCTION PANEL
Albumin: 2.3 g/dL — ABNORMAL LOW (ref 3.5–5.0)
Anion gap: 12 (ref 5–15)
BUN: 41 mg/dL — ABNORMAL HIGH (ref 6–20)
CALCIUM: 9 mg/dL (ref 8.9–10.3)
CO2: 27 mmol/L (ref 22–32)
CREATININE: 6.17 mg/dL — AB (ref 0.44–1.00)
Chloride: 92 mmol/L — ABNORMAL LOW (ref 101–111)
GFR calc non Af Amer: 8 mL/min — ABNORMAL LOW (ref >60)
GFR, EST AFRICAN AMERICAN: 9 mL/min — AB (ref >60)
Glucose, Bld: 79 mg/dL (ref 65–99)
Phosphorus: 6.9 mg/dL — ABNORMAL HIGH (ref 2.5–4.6)
Potassium: 4.4 mmol/L (ref 3.5–5.1)
SODIUM: 131 mmol/L — AB (ref 135–145)

## 2016-02-16 LAB — PH, BODY FLUID: PH, BODY FLUID: 7.6

## 2016-02-16 LAB — CBC
HCT: 29.6 % — ABNORMAL LOW (ref 36.0–46.0)
Hemoglobin: 9.2 g/dL — ABNORMAL LOW (ref 12.0–15.0)
MCH: 28.1 pg (ref 26.0–34.0)
MCHC: 31.1 g/dL (ref 30.0–36.0)
MCV: 90.5 fL (ref 78.0–100.0)
PLATELETS: 234 10*3/uL (ref 150–400)
RBC: 3.27 MIL/uL — AB (ref 3.87–5.11)
RDW: 16.6 % — ABNORMAL HIGH (ref 11.5–15.5)
WBC: 7 10*3/uL (ref 4.0–10.5)

## 2016-02-16 LAB — HEMOGLOBIN A1C
HEMOGLOBIN A1C: 11.2 % — AB (ref 4.8–5.6)
Mean Plasma Glucose: 275 mg/dL

## 2016-02-16 LAB — MRSA PCR SCREENING: MRSA BY PCR: NEGATIVE

## 2016-02-16 MED ORDER — HEPARIN SODIUM (PORCINE) 1000 UNIT/ML DIALYSIS
1000.0000 [IU] | INTRAMUSCULAR | Status: DC | PRN
  Filled 2016-02-16: qty 1

## 2016-02-16 MED ORDER — PENTAFLUOROPROP-TETRAFLUOROETH EX AERO: 1.0000 "application " | INHALATION_SPRAY | CUTANEOUS | Status: DC | PRN

## 2016-02-16 MED ORDER — SEVELAMER CARBONATE 800 MG PO TABS: 4000.0000 mg | ORAL_TABLET | Freq: Three times a day (TID) | ORAL | Status: DC

## 2016-02-16 MED ORDER — LIDOCAINE-PRILOCAINE 2.5-2.5 % EX CREA
1.0000 "application " | TOPICAL_CREAM | CUTANEOUS | Status: DC | PRN
  Filled 2016-02-16: qty 5

## 2016-02-16 MED ORDER — DOXERCALCIFEROL 4 MCG/2ML IV SOLN
6.0000 ug | INTRAVENOUS | Status: DC
  Administered 2016-02-16 – 2016-02-21 (×3): 6 ug via INTRAVENOUS
  Filled 2016-02-16 (×2): qty 4

## 2016-02-16 MED ORDER — SODIUM CHLORIDE 0.9 % IV SOLN: 100.0000 mL | INTRAVENOUS | Status: DC | PRN

## 2016-02-16 MED ORDER — DEXTROSE 5 % IV SOLN
1.0000 g | INTRAVENOUS | Status: DC
  Administered 2016-02-16 – 2016-02-20 (×5): 1 g via INTRAVENOUS
  Filled 2016-02-16 (×5): qty 10

## 2016-02-16 MED ORDER — SEVELAMER CARBONATE 800 MG PO TABS
5600.0000 mg | ORAL_TABLET | Freq: Three times a day (TID) | ORAL | Status: DC
  Administered 2016-02-16 – 2016-02-18 (×6): 5600 mg via ORAL
  Filled 2016-02-16 (×6): qty 7

## 2016-02-16 MED ORDER — ALTEPLASE 2 MG IJ SOLR: 2.0000 mg | Freq: Once | INTRAMUSCULAR | Status: DC | PRN

## 2016-02-16 MED ORDER — LIDOCAINE HCL (PF) 1 % IJ SOLN
5.0000 mL | INTRAMUSCULAR | Status: DC | PRN
  Filled 2016-02-16: qty 5

## 2016-02-16 MED ORDER — HEPARIN SODIUM (PORCINE) 1000 UNIT/ML DIALYSIS
3000.0000 [IU] | Freq: Once | INTRAMUSCULAR | Status: DC
  Filled 2016-02-16: qty 3

## 2016-02-16 MED ORDER — ACETAMINOPHEN 325 MG PO TABS
ORAL_TABLET | ORAL | Status: AC
  Filled 2016-02-16: qty 2

## 2016-02-16 MED ORDER — DOXERCALCIFEROL 4 MCG/2ML IV SOLN
INTRAVENOUS | Status: AC
  Filled 2016-02-16: qty 4

## 2016-02-16 NOTE — Consult Note (Signed)
Advanced Heart Failure Team Consult Note  Referring Physician: Dr Nadyne Coombes Primary Physician:  Primary Cardiologist:  Dr Haroldine Laws  Reason for Consultation: Heart Failure   HPI:    Ms [name] is a 40 year old with history of chronic diastolic heart failure, uncontrolled HTN, DM, Hyperlipidemia, ESRD on HD M-W-F, neuropathy admitted for evaluation of abdominal pain.   Very long history (decades) of severe HTN.   Prior to admit SBP 160-180s. Recently tried minoxidil but later stopped due to facial hair growth. Has had 20 pound weight loss since April 2007.   Admitted with abdominal pain and ascites. Sent to IR for paracentesis with 3.4 liters fluid removed. Cell count from peritoneal fluid WBC 4300 and neutrophil could 82% c/w SBP.  Pertinent admission labs include: K 3.7, Creatinine 4.01, albumin 2.8, HGB A1C 11.2, Hgb 9.7, lactic 0.90. CXR did not show pulmonary edema.   Today she denies SOB/Orthopnea.    Review of Systems: [y] = yes, [ ]  = no   General: Weight gain [ ] ; Weight loss [Y ]; Anorexia [ ] ; Fatigue [ ] ; Fever [ ] ; Chills [ ] ; Weakness [Y ]  Cardiac: Chest pain/pressure [ ] ; Resting SOB [ ] ; Exertional SOB [ ] ; Orthopnea [ ] ; Pedal Edema [ y]; Palpitations [ ] ; Syncope [ ] ; Presyncope [ ] ; Paroxysmal nocturnal dyspnea[ ]   Pulmonary: Cough [ ] ; Wheezing[ ] ; Hemoptysis[ ] ; Sputum [ ] ; Snoring [ ]   GI: Vomiting[ ] ; Dysphagia[ ] ; Melena[ ] ; Hematochezia [ ] ; Heartburn[ ] ; Abdominal pain [ ] ; Constipation [ ] ; Diarrhea [ ] ; BRBPR [ ]   GU: Hematuria[ ] ; Dysuria [ ] ; Nocturia[ ]   Vascular: Pain in legs with walking [ ] ; Pain in feet with lying flat [ ] ; Non-healing sores [ ] ; Stroke [ ] ; TIA [ ] ; Slurred speech [ ] ;  Neuro: Headaches[ ] ; Vertigo[ ] ; Seizures[ ] ; Paresthesias[ ] ;Blurred vision [ ] ; Diplopia [ ] ; Vision changes [ ]   Ortho/Skin: Arthritis [ ] ; Joint pain [Y ]; Muscle pain [ ] ; Joint swelling [ ] ; Back Pain [ ] ; Rash [ ]   Psych: Depression[ ] ; Anxiety[ ]   Heme: Bleeding  problems [ ] ; Clotting disorders [ ] ; Anemia [ ]   Endocrine: Diabetes [Y ]; Thyroid dysfunction[ ]   Home Medications Prior to Admission medications   Medication Sig Start Date End Date Taking? Authorizing Provider  atorvastatin (LIPITOR) 40 MG tablet Take 40 mg by mouth daily.   Yes Historical Provider, MD  cloNIDine (CATAPRES) 0.3 MG tablet Take 1 tablet (0.3 mg total) by mouth 3 (three) times daily. 11/01/14  Yes Debbe Odea, MD  clopidogrel (PLAVIX) 75 MG tablet Take 75 mg by mouth at bedtime.    Yes Historical Provider, MD  HUMALOG MIX 50/50 KWIKPEN (50-50) 100 UNIT/ML Kwikpen Inject 26-28 Units into the skin 2 (two) times daily. 28 in the morning and 26 in the evening. 04/29/14  Yes Historical Provider, MD  hydrALAZINE (APRESOLINE) 50 MG tablet Take 1 tablet (50 mg total) by mouth every 8 (eight) hours. 11/01/14  Yes Debbe Odea, MD  isosorbide mononitrate (IMDUR) 60 MG 24 hr tablet Take 60 mg by mouth daily.   Yes Historical Provider, MD  labetalol (NORMODYNE) 300 MG tablet Take 1 tablet (300 mg total) by mouth 3 (three) times daily. 11/01/14  Yes Debbe Odea, MD  RENVELA 800 MG tablet Take 4,800 mg by mouth 3 (three) times daily with meals.  04/17/13  Yes Historical Provider, MD  amLODipine (NORVASC) 10 MG tablet Take 1 tablet (10 mg  total) by mouth daily. Patient not taking: Reported on 02/15/2016 07/05/13   Minus Breeding, MD  Ferric Citrate (AURYXIA PO) Take 2 tablets by mouth 3 (three) times daily with meals.    Historical Provider, MD    Past Medical History: Past Medical History:  Diagnosis Date  . Chronic anemia    2nd to renal disease  . CIN III (cervical intraepithelial neoplasia grade III) with severe dysplasia    S/P LEEP AND CONE  . Coronary artery disease    Status post cardiac catheterization June 2012 scattered coronary artery disease/atherosclerosis with 70-80% stenosis in a small right PDA.  . Diabetic Charcot's joint disease ([place])   . DM (diabetes mellitus) ([place])     Long-term insulin  . End stage renal disease on dialysis ([place]) 05/02/11   [facility]; M; W, F; last time 05/01/11  . Fracture of 5th metatarsal 2016   Right  . Gastroparesis   . 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 History of abscesses in groins 12/06/2010  . Hyperlipidemia    Hypertriglyceridemia 449 HDL 25  . Hypertension   . Migraines    "just on dialysis days"  . Peripheral neuropathy (H[facility])    related to DM  . Peripheral vascular disease ([place])    Tibial occlusive disease evaluated by Dr. Kellie Simmering in August 2011. Medical therapy  . Renal insufficiency    Dialysis since 2012  . Tobacco use disorder    Discontinued March 2012  . Trimalleolar fracture of ankle, closed 02/09/2015   Right    Past Surgical History: Past Surgical History:  Procedure Laterality Date  . AV FISTULA PLACEMENT  04/2010  . CARDIAC CATHETERIZATION [name] 02/10/2015   Procedure: Left Heart Cath and Coronary Angiography;  Surgeon: Dixie Dials, MD;  Location: [facility];  Service: Cardiovascular;  Laterality: [name];  . CERVICAL BIOPSY  W/ LOOP ELECTRODE EXCISION     h/o  . CERVICAL CONE BIOPSY     h/o  . COLPOSCOPY    . DILATION AND CURETTAGE OF UTERUS  2009  . ORIF ANKLE FRACTURE Right 02/09/2015  . ORIF ANKLE FRACTURE Right 02/16/2015   Procedure: OPEN REDUCTION INTERNAL FIXATION (ORIF) RIGHT ANKLE FRACTURE;  Surgeon: Altamese Thibodaux, MD;  Location: [facility];  Service: Orthopedics;  Laterality: Right;    Family History: Family History  Problem Relation Age of Onset  . Diabetes Mother   . Hypertension Mother   . Diabetes Father   . Hyperlipidemia Father   . Hypertension Father   . Diabetes Sister   . Stroke Maternal Grandmother   . Diabetes Sister   . Breast cancer Maternal Aunt     Age 11's    Social History: Social History   Social History  . Marital status: Married    Spouse name: [name]  . Number of children: [name]  . Years of education: [name]   Occupational History  .  [employer]   Social History Main  Topics  . Smoking status: Former Smoker    Packs/day: 0.30    Years: 10.00    Types: Cigarettes    Quit date: 08/05/2011  . Smokeless tobacco: Never Used     Comment: "quit smoking cigarettes 07/2010"  . Alcohol use No  . Drug use: No  . Sexual activity: Yes    Birth control/ protection: Condom   Other Topics Concern  . None   Social History Narrative  . None    Allergies:  Allergies  Allergen Reactions  . Cephalexin Other (  See Comments)    Reaction unknown  . Sulfamethoxazole-Trimethoprim Other (See Comments)    Thrush    Objective:    Vital Signs:   Temp:  [97.7 F (36.5 C)-98.3 F (36.8 C)] 98.1 F (36.7 C) (09/22 1328) Pulse Rate:  [64-71] 69 (09/22 1328) Resp:  [16-19] 18 (09/22 1328) BP: (165-199)/(59-98) 176/67 (09/22 1328) SpO2:  [95 %-100 %] 100 % (09/22 1328) Weight:  [200 lb 6.4 oz (90.9 kg)-207 lb 0.2 oz (93.9 kg)] 200 lb 6.4 oz (90.9 kg) (09/22 1159) Last BM Date: 02/11/16  Weight change: Filed Weights   02/15/16 0624 02/16/16 0750 02/16/16 1159  Weight: 209 lb 4.8 oz (94.9 kg) 207 lb 0.2 oz (93.9 kg) 200 lb 6.4 oz (90.9 kg)    Intake/Output:   Intake/Output Summary (Last 24 hours) at 02/16/16 1441 Last data filed at 02/16/16 1159  Gross per 24 hour  Intake                0 ml  Output             3000 ml  Net            -3000 ml     Physical Exam: General:  Well appearing. No resp difficulty HEENT: normal Neck: supple. JVP up to the jaw with prominent CV waves.  Carotids 2+ bilat; no bruits. No lymphadenopathy or thryomegaly appreciated. Cor: PMI nondisplaced. Regular rate & rhythm. No rubs, gallops or murmurs. Lungs: clear Abdomen: soft, nontender, nondistended. No hepatosplenomegaly. No bruits or masses. Good bowel sounds. Extremities: no cyanosis, clubbing, rash, edema Neuro: alert & orientedx3, cranial nerves grossly intact. moves all 4 extremities w/o difficulty. Affect pleasant    Labs: Basic Metabolic Panel:  Recent Labs Lab  02/14/16 1841 02/15/16 0849 02/16/16 0832  NA 137 132* 131*  K 3.7 4.4 4.4  CL 94* 90* 92*  CO2 30 29 27   GLUCOSE 149* 599* 79  BUN 18 27* 41*  CREATININE 4.01* 4.74* 6.17*  CALCIUM 9.0 9.1 9.0  PHOS  --   --  6.9*    Liver Function Tests:  Recent Labs Lab 02/14/16 1841 02/16/16 0832  AST 12*  --   ALT 11*  --   ALKPHOS 236*  --   BILITOT 0.8  --   PROT 6.2*  --   ALBUMIN 2.8* 2.3*    Recent Labs Lab 02/14/16 1841  LIPASE 30   No results for input(s): AMMONIA in the last 168 hours.  CBC:  Recent Labs Lab 02/14/16 1841 02/16/16 0832  WBC 8.1 7.0  HGB 9.7* 9.2*  HCT 32.5* 29.6*  MCV 93.1 90.5  PLT 222 234    Cardiac Enzymes: No results for input(s): CKTOTAL, CKMB, CKMBINDEX, TROPONINI in the last 168 hours.  BNP: BNP (last 3 results)  Recent Labs  08/26/15 1433  BNP 3,934.2*    ProBNP (last 3 results) No results for input(s): PROBNP in the last 8760 hours.   CBG:  Recent Labs Lab 02/15/16 1724 02/15/16 2111 02/15/16 2244 02/16/16 0954 02/16/16 1326  GLUCAP 353* 109* 120* 86 151*    Coagulation Studies:  Recent Labs  02/15/16 1418  LABPROT 15.7*  INR 1.24    Other results: EKG: NSR 69 bpm . Normal volts Imaging: Dg Chest 2 View  Result Date: 02/15/2016 CLINICAL DATA:  Patient reports fluid in the abdomen. History of CAD, hypertension, diabetes. History of dialysis. EXAM: CHEST  2 VIEW COMPARISON:  10/25/2015 FINDINGS: The heart is enlarged and  stable in configuration. There are no focal consolidations or pleural effusions. No pulmonary edema. IMPRESSION: Stable cardiomegaly. Electronically Signed   By: Nolon Nations M.D.   On: 02/15/2016 13:14   Ct Abdomen Pelvis W Contrast  Result Date: 02/15/2016 CLINICAL DATA:  Acute onset of generalized abdominal distention, nausea and vomiting. Abdominal pain. Initial encounter. EXAM: CT ABDOMEN AND PELVIS WITH CONTRAST TECHNIQUE: Multidetector CT imaging of the abdomen and pelvis was  performed using the standard protocol following bolus administration of intravenous contrast. CONTRAST:  100 mL ISOVUE-300 IOPAMIDOL (ISOVUE-300) INJECTION 61% COMPARISON:  MRI of the lumbar spine performed 11/02/2015, and CT of the abdomen and pelvis from 05/24/2014 FINDINGS: Lower chest: A small to moderate pericardial effusion is noted. Diffuse coronary artery calcifications are seen. The visualized lung bases appear grossly clear. Hepatobiliary: The liver is grossly unremarkable in appearance. The gallbladder is grossly unremarkable, though difficult to fully assess given adjacent ascites. The common bile duct remains normal in caliber. Pancreas: The pancreas is within normal limits. Spleen: The spleen is unremarkable in appearance. Adrenals/Urinary Tract: The adrenal glands are unremarkable in appearance. There is moderate renal atrophy bilaterally, with scattered small bilateral renal cysts and underlying vascular calcifications at both renal hila. There is no evidence of hydronephrosis. Stomach/Bowel: The stomach is unremarkable in appearance. The small bowel is within normal limits. The appendix is normal in caliber, without evidence of appendicitis. The colon is unremarkable in appearance. Vascular/Lymphatic: Relatively diffuse calcification is seen along the abdominal aorta and its branches. The inferior vena cava is grossly unremarkable. No pelvic sidewall lymphadenopathy is seen. There is no evidence of retroperitoneal or inguinal lymphadenopathy. Reproductive: The bladder is mildly distended and within normal limits. The uterus is grossly unremarkable in appearance. The ovaries are relatively symmetric. No suspicious adnexal masses are seen. Other: Small to moderate volume ascites is seen within the abdomen and pelvis. No additional soft tissue abnormalities are seen. Musculoskeletal: No acute osseous abnormalities are identified. The visualized musculature is unremarkable in appearance. IMPRESSION: 1.  Small to moderate volume ascites noted within the abdomen and pelvis. 2. Small to moderate pericardial effusion seen. 3. Diffuse coronary artery calcifications noted. 4. Aortic atherosclerosis. 5. Moderate bilateral renal atrophy, with scattered small bilateral renal cysts. Electronically Signed   By: Garald Balding M.D.   On: 02/15/2016 02:54   US Paracentesis  Result Date: 02/15/2016 INDICATION: End stage renal failure on hemodialysis. Ascites. Request for diagnostic and therapeutic paracentesis. EXAM: ULTRASOUND GUIDED RIGHT LATERAL ABDOMEN PARACENTESIS MEDICATIONS: 1% Lidocaine. COMPLICATIONS: None immediate. PROCEDURE: Informed written consent was obtained from the patient after a discussion of the risks, benefits and alternatives to treatment. A timeout was performed prior to the initiation of the procedure. Initial ultrasound scanning demonstrates a moderate amount of ascites within the right lateral abdomen. The right lateral abdomen was prepped and draped in the usual sterile fashion. 1% lidocaine with epinephrine was used for local anesthesia. Following this, a 19 gauge, 7-cm, Yueh catheter was introduced. An ultrasound image was saved for documentation purposes. The paracentesis was performed. The catheter was removed and a dressing was applied. The patient tolerated the procedure well without immediate post procedural complication. FINDINGS: A total of approximately 3.4 liters of clear light orange fluid was removed. Samples were sent to the laboratory as requested by the clinical team. IMPRESSION: Successful ultrasound-guided paracentesis yielding 3.4 liters of peritoneal fluid. Read by:  Gareth Eagle, PA-C Electronically Signed   By: Corrie Mckusick D.O.   On: 02/15/2016 13:09  Medications:     Current Medications: . acetaminophen      . amLODipine  10 mg Oral Daily  . atorvastatin  40 mg Oral q1800  . cefTRIAXone (ROCEPHIN)  IV  1 g Intravenous Q24H  . cloNIDine  0.3 mg Oral TID  .  clopidogrel  75 mg Oral QHS  . doxercalciferol      . doxercalciferol  6 mcg Intravenous Q M,W,F-HD  . [START ON 02/17/2016] heparin  3,000 Units Dialysis Once in dialysis  . heparin  5,000 Units Subcutaneous Q8H  . hydrALAZINE  50 mg Oral Q8H  . insulin aspart  0-9 Units Subcutaneous TID WC  . insulin aspart protamine- aspart  26 Units Subcutaneous Q supper  . insulin aspart protamine- aspart  28 Units Subcutaneous Q breakfast  . isosorbide mononitrate  60 mg Oral Daily  . labetalol  300 mg Oral TID  . sevelamer carbonate  5,600 mg Oral TID WC     Infusions:      Assessment:   1. Ascites/ Abdominal Pain with SBP 3. ESRD 4.Chronic Diastolic Heart Failure 5. HTN Urgency  6. DMII 7. Hyperlipidemia 8. Hypoalbuminemia    Plan/Discussion:     Plan per Dr Vaughan Browner  Length of Stay: [place] NP-C 02/16/2016, 2:41 PM  Advanced Heart Failure Team Pager [phone] (M-F; [place])  Please contact [facility] for night-coverage after hours (4p -7a ) and weekends on [email or web address]  Patient seen and examined with Darrick Grinder, NP. We discussed all aspects of the encounter. I agree with the assessment and plan as stated above.   Three major issues:  1) Severe HTN - She has persistent severe HTN despite 4-drug regimen. Minoxidil recently stopped due to hirsiuitism. Recommend titration of current regimen. Can add doxazosin as needed.  2) Severe LVH with question of amyloidosis. I have reviewed echo from 2016. 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 Can check SPEP/UPEP. Not candidate for cMRI with ESRD so if needs further w/u can consider fat pad biopsy as next step.  3) Right heart failure: On exam has significant TR murmur with prominent c-v waves in jugular waveform. Also has ascites and low albumin suggestive of cardiac cirrhosis but liver parenchymal normal on CT. Will await repeat echo. May be  reasonable to consider RUQ u/s with hepatic/portal flow velocities. Check hepatitis panels.   D/w Dr. Einar Gip. We will follow as needed.   Bensimhon, Daniel,MD 4:54 PM

## 2016-02-16 NOTE — Progress Notes (Signed)
[facility] Progress Note   Dialysis Orders: MWF NW  4h 32min   95.5kg  2/2 bath  P2   Hep 3000  AVF Hect 6 ug Mircera 225 ug q 2wk last 9/20 Auryxia 2 tab ac and renvela 6 ac Sensipar 60/d - no taking Ca 10, P 7.2 Hb 9.3   Assessment/Plan: 1. Abd pain / new onset ascites - Has been having some N and V but not now;  Appetite not as good either. CT neg for malignancy. S/p 3.4 L para yesterday Alb is somewhat low 2.3 which could be from malnutrition and/or liver disease. Per last ECHO has hx of diast dysfunction and some degree of pulm HTN, hx of refractory HTN it appears.   Would be concerned about hemodynamic, poss RHF, etc., vs liver disease; cytology pending. Dr. Einar Gip following - he also requested Dr. Haroldine Laws to see. On empiric abx  2. ESRD HD MWF Na 131K 4.4  3. Unintentional wt loss -worrisome - continue to work up ascites 4. Poorly controlled HTN/volume - on mult BP meds Gains are 2-3.5 - needs lower EDW- pre HD wt 93.9 standing- no drop on BP yet - has not been cramping during outpt HD treatments so volume can definitely come down.  She tells me, she takes her BP meds faithfully.  We will see how BP responds when she gets her post HD BP meds today - goal to 3.5 today - if BP not better tomorrow - consider extra tmt for more ultrafiltration 5. Anemia no need for ESA, last 225 dose 9/20, won't be due to 2 weeks hgb 9.2 - consistent with last outpt Hgb which has been trending down tsat 51% in August 6. 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 Peritoneal fluid has ^^PMN's, low-grade temp 99.5 and abd pain.  Fluid cx's pending.  Stable on HD  now, pulling 3 L fluid w/o difficulty.  Started on Rocephin by primary team.  Kelly Splinter MD [facility] pager [phone]    cell [phone] 02/16/2016, 11:05 AM    Subjective:     Objective Vitals:   02/16/16 0830 02/16/16 0900 02/16/16 0930 02/16/16 1000  BP: (!) 187/90 (!) 191/93 (!) 182/98 (!) 198/86  Pulse: 66 65 67 71  Resp:      Temp:      TempSrc:      SpO2:      Weight:      Height:       Physical Exam General: Heart: Lungs: Abdomen: Extremities: Dialysis Access:    Additional Objective Labs: Basic Metabolic Panel:  Recent Labs Lab 02/14/16 1841 02/15/16 0849 02/16/16 0832  NA 137 132* 131*  K 3.7 4.4 4.4  CL 94* 90* 92*  CO2 30 29 27   GLUCOSE 149* 599* 79  BUN 18 27* 41*  CREATININE 4.01* 4.74* 6.17*  CALCIUM 9.0 9.1 9.0  PHOS  --   --  6.9*   Liver Function Tests:  Recent Labs Lab 02/14/16 1841 02/16/16 0832  AST 12*  --   ALT 11*  --   ALKPHOS 236*  --  BILITOT 0.8  --   PROT 6.2*  --   ALBUMIN 2.8* 2.3*    Recent Labs Lab 02/14/16 1841  LIPASE 30   CBC:  Recent Labs Lab 02/14/16 1841 02/16/16 0832  WBC 8.1 7.0  HGB 9.7* 9.2*  HCT 32.5* 29.6*  MCV 93.1 90.5  PLT 222 234   Blood Culture    Component Value Date/Time   SDES FLUID ABDOMEN PERITONEAL 02/15/2016 1341   SPECREQUEST NONE 02/15/2016 1341   CULT NO GROWTH 5 DAYS 10/16/2011 1037   REPTSTATUS 02/15/2016 FINAL 02/15/2016 1341    CBG:  Recent Labs Lab 02/15/16 0823 02/15/16 1332 02/15/16 2111 02/15/16 2244 02/16/16 0954  GLUCAP 599* 475* 109* 120* 86   Lab Results  Component Value Date   INR 1.24 02/15/2016   INR 1.14 02/15/2015   INR 1.09 02/09/2015   Studies/Results: Dg Chest 2 View  Result Date: 02/15/2016 CLINICAL DATA:  Patient reports fluid in the abdomen. History of CAD, hypertension, diabetes. History of dialysis. EXAM: CHEST  2 VIEW COMPARISON:  10/25/2015 FINDINGS: The heart is enlarged and stable in configuration.  There are no focal consolidations or pleural effusions. No pulmonary edema. IMPRESSION: Stable cardiomegaly. Electronically Signed   By: Nolon Nations M.D.   On: 02/15/2016 13:14   Ct Abdomen Pelvis W Contrast  Result Date: 02/15/2016 CLINICAL DATA:  Acute onset of generalized abdominal distention, nausea and vomiting. Abdominal pain. Initial encounter. EXAM: CT ABDOMEN AND PELVIS WITH CONTRAST TECHNIQUE: Multidetector CT imaging of the abdomen and pelvis was performed using the standard protocol following bolus administration of intravenous contrast. CONTRAST:  100 mL ISOVUE-300 IOPAMIDOL (ISOVUE-300) INJECTION 61% COMPARISON:  MRI of the lumbar spine performed 11/02/2015, and CT of the abdomen and pelvis from 05/24/2014 FINDINGS: Lower chest: A small to moderate pericardial effusion is noted. Diffuse coronary artery calcifications are seen. The visualized lung bases appear grossly clear. Hepatobiliary: The liver is grossly unremarkable in appearance. The gallbladder is grossly unremarkable, though difficult to fully assess given adjacent ascites. The common bile duct remains normal in caliber. Pancreas: The pancreas is within normal limits. Spleen: The spleen is unremarkable in appearance. Adrenals/Urinary Tract: The adrenal glands are unremarkable in appearance. There is moderate renal atrophy bilaterally, with scattered small bilateral renal cysts and underlying vascular calcifications at both renal hila. There is no evidence of hydronephrosis. Stomach/Bowel: The stomach is unremarkable in appearance. The small bowel is within normal limits. The appendix is normal in caliber, without evidence of appendicitis. The colon is unremarkable in appearance. Vascular/Lymphatic: Relatively diffuse calcification is seen along the abdominal aorta and its branches. The inferior vena cava is grossly unremarkable. No pelvic sidewall lymphadenopathy is seen. There is no evidence of retroperitoneal or inguinal  lymphadenopathy. Reproductive: The bladder is mildly distended and within normal limits. The uterus is grossly unremarkable in appearance. The ovaries are relatively symmetric. No suspicious adnexal masses are seen. Other: Small to moderate volume ascites is seen within the abdomen and pelvis. No additional soft tissue abnormalities are seen. Musculoskeletal: No acute osseous abnormalities are identified. The visualized musculature is unremarkable in appearance. IMPRESSION: 1. Small to moderate volume ascites noted within the abdomen and pelvis. 2. Small to moderate pericardial effusion seen. 3. Diffuse coronary artery calcifications noted. 4. Aortic atherosclerosis. 5. Moderate bilateral renal atrophy, with scattered small bilateral renal cysts. Electronically Signed   By: Garald Balding M.D.   On: 02/15/2016 02:54   US Paracentesis  Result Date: 02/15/2016 INDICATION: End stage renal failure on hemodialysis.  Ascites. Request for diagnostic and therapeutic paracentesis. EXAM: ULTRASOUND GUIDED RIGHT LATERAL ABDOMEN PARACENTESIS MEDICATIONS: 1% Lidocaine. COMPLICATIONS: None immediate. PROCEDURE: Informed written consent was obtained from the patient after a discussion of the risks, benefits and alternatives to treatment. A timeout was performed prior to the initiation of the procedure. Initial ultrasound scanning demonstrates a moderate amount of ascites within the right lateral abdomen. The right lateral abdomen was prepped and draped in the usual sterile fashion. 1% lidocaine with epinephrine was used for local anesthesia. Following this, a 19 gauge, 7-cm, Yueh catheter was introduced. An ultrasound image was saved for documentation purposes. The paracentesis was performed. The catheter was removed and a dressing was applied. The patient tolerated the procedure well without immediate post procedural complication. FINDINGS: A total of approximately 3.4 liters of clear light orange fluid was removed. Samples  were sent to the laboratory as requested by the clinical team. IMPRESSION: Successful ultrasound-guided paracentesis yielding 3.4 liters of peritoneal fluid. Read by:  Gareth Eagle, PA-C Electronically Signed   By: Corrie Mckusick D.O.   On: 02/15/2016 13:09   Medications:   . acetaminophen      . amLODipine  10 mg Oral Daily  . atorvastatin  40 mg Oral q1800  . cloNIDine  0.3 mg Oral TID  . clopidogrel  75 mg Oral QHS  . heparin  5,000 Units Subcutaneous Q8H  . hydrALAZINE  50 mg Oral Q8H  . insulin aspart  0-9 Units Subcutaneous TID WC  . insulin aspart protamine- aspart  26 Units Subcutaneous Q supper  . insulin aspart protamine- aspart  28 Units Subcutaneous Q breakfast  . isosorbide mononitrate  60 mg Oral Daily  . labetalol  300 mg Oral TID  . sevelamer carbonate  800 mg Oral TID WC  . sucroferric oxyhydroxide  500 mg Oral TID WC

## 2016-02-16 NOTE — Progress Notes (Signed)
Subjective:  Feels well. Abdominal discomfort improved since centesis.  Objective:  Vital Signs in the last 24 hours: Temp:  [97.7 F (36.5 C)-98.9 F (37.2 C)] 97.8 F (36.6 C) (09/22 0750) Pulse Rate:  [64-71] 67 (09/22 0930) Resp:  [16-19] 16 (09/22 0758) BP: (165-212)/(59-98) 182/98 (09/22 0930) SpO2:  [95 %-99 %] 98 % (09/22 0750) Weight:  [93.9 kg (207 lb 0.2 oz)] 93.9 kg (207 lb 0.2 oz) (09/22 0750)  Intake/Output from previous day: 09/21 0701 - 09/22 0700 In: 480 [P.O.:480] Out: -   Physical Exam: General appearance: alert, cooperative, appears older than stated age, fatigued and no distress Lungs: clear to auscultation bilaterally  Cardiac: S1 and S2 is normal,  2/6 systolic ejection murmur in the left parasternal region is heard.  No gallop. Chest wall: no tenderness Abdomen: soft, non-tender; bowel sounds normal; no masses,  no organomegaly and mildly distended, ascites present. Extremities: extremities normal, atraumatic, no cyanosis or edema Pulses: Bilateral hair loss present, femoral pulses 2+ with a lateral bruit, popliteal pulse faint bilateral.  Pedal pulses absent bilaterally.  Capillary refill normal. Left forearm AV fistula, functioning. Neurologic: Grossly normal Lab Results: BMP  Recent Labs  02/14/16 1841 02/15/16 0849 02/16/16 0832  NA 137 132* 131*  K 3.7 4.4 4.4  CL 94* 90* 92*  CO2 30 29 27   GLUCOSE 149* 599* 79  BUN 18 27* 41*  CREATININE 4.01* 4.74* 6.17*  CALCIUM 9.0 9.1 9.0  GFRNONAA 13* 11* 8*  GFRAA 15* 12* 9*    CBC  Recent Labs Lab 02/16/16 0832  WBC 7.0  RBC 3.27*  HGB 9.2*  HCT 29.6*  PLT 234  MCV 90.5  MCH 28.1  MCHC 31.1  RDW 16.6*    HEMOGLOBIN A1C Lab Results  Component Value Date   HGBA1C 8.8 (H) 02/11/2015   MPG 206 02/11/2015     Recent Labs  08/26/15 1433  TROPONINI 0.05*   Recent Labs  08/26/15 1433 02/14/16 1841 02/16/16 0832  PROT 6.6 6.2*  --   ALBUMIN 3.6 2.8* 2.3*  AST 19 12*  --    ALT 15 11*  --   ALKPHOS 63 236*  --   BILITOT 1.0 0.8  --     EKG 02/15/2016: Normal sinus rhythm at a rate of 69 bpm, left axis deviation, left anterior fascicular block.  Poor R-wave progression, cannot exclude anterior infarct old.  LVH with repolarization abnormality, cannot exclude high lateral ischemia.  Compared to 0/05/2015, no significant changes evident, ST segment changes in V5 and V6 much improved.   Out-patient:  Echocardiogram 12/28/2015: 1. Left ventricle cavity is normal in size. Severe concentric hypertrophy of the left ventricle with speckled pattern suggestive of infiltrative cardiomyopathy. Normal global wall motion. Doppler evidence of grade II (pseudonormal) diastolic dysfunction. Diastolic dysfunction findings suggests elevated LA/LV end diastolic pressure. Calculated EF 64%. 2. Severe bi-atrial enlargement, LA measures 6.3 cm. 3. Right ventricle cavity is normal in size. Moderate concentric hypertrophy of the right ventricle. Speckled pattern suggests infiltrative cardiomyopathy. Normal right ventricular function. 4. Trace aortic regurgitation. 5. Mild to moderate tricuspid regurgitation. Moderate to severe pulmonary hypertension. Pulmonary artery systolic pressure is estimated at 60 mm Hg. 6. Mild to moderate pulmonic regurgitation. 7. Moderate pericardial effusion with clear circumferential 1.6 cm is seen without hemodynamic compromise. 8. IVC is dilated with poor inspiration collapse consistent with elevated right atrial pressure. Compared to 10/31/14, LVH worse from moderate.   Radiology: Dg Chest 2 View  Result Date:  02/15/2016 CLINICAL DATA:  Patient reports fluid in the abdomen. History of CAD, hypertension, diabetes. History of dialysis. EXAM: CHEST  2 VIEW COMPARISON:  10/25/2015 FINDINGS: The heart is enlarged and stable in configuration. There are no focal consolidations or pleural effusions. No pulmonary edema. IMPRESSION: Stable cardiomegaly. Electronically  Signed   By: Nolon Nations M.D.   On: 02/15/2016 13:14   Ct Abdomen Pelvis W Contrast 02/15/2016: 1. Small to moderate volume ascites noted within the abdomen and pelvis. 2. Small to moderate pericardial effusion seen. 3. Diffuse coronary artery calcifications noted. 4. Aortic atherosclerosis. 5. Moderate bilateral renal atrophy, with scattered small bilateral renal cysts. Electronically Signed   By: Garald Balding M.D.   On: 02/15/2016 02:54   ULTRASOUND GUIDED RIGHT LATERAL ABDOMEN PARACENTESIS 02/15/2016: FINDINGS: A total of approximately 3.4 liters of clear light orange fluid was removed. Samples were sent to the laboratory as requested by the clinical team.  Scheduled Meds: . acetaminophen      . amLODipine  10 mg Oral Daily  . atorvastatin  40 mg Oral q1800  . cloNIDine  0.3 mg Oral TID  . clopidogrel  75 mg Oral QHS  . heparin  5,000 Units Subcutaneous Q8H  . hydrALAZINE  50 mg Oral Q8H  . insulin aspart  0-9 Units Subcutaneous TID WC  . insulin aspart protamine- aspart  26 Units Subcutaneous Q supper  . insulin aspart protamine- aspart  28 Units Subcutaneous Q breakfast  . isosorbide mononitrate  60 mg Oral Daily  . labetalol  300 mg Oral TID  . sevelamer carbonate  800 mg Oral TID WC  . sucroferric oxyhydroxide  500 mg Oral TID WC   Continuous Infusions:  PRN Meds:.acetaminophen, zolpidem   Assessment/Plan:  1.  Pericardial effusion, moderate sized without  hemodynamic consequence, patient in fact is hypertensive.   2.  Ascites probably secondary to congestive heart failure, most probably combination of biventricular failure with acute on chronic diastolic heart failure and pulmonary hypertension with RV failure. 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  Hypertension that is chronic and uncontrolled may also lead to this clinical presentation. 3. Severe hypoalbuminemia, new finding,may also be  contributing to pericardial effusion and ascites.   4. 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 5. Hypertension, uncontrolled 6.  Hyperlipidemia  Recommendation: Patient's BP continues to be uncontrolled even on dialysis. I am having a hard time to convince her to take medications as prescribed as she tends to hold meds as they make her feel Tired and fatigued also states that her blood pressure is low after dialysis.  I would like an opinion from nephrology to see what they would recommend  With regard to management of hypertension. I have requested Dr. Pierre Bali to see her also.    Adrian Prows, M.D. 02/16/2016, 9:45 AM [facility] Pager: [phone] Office: [phone] If no answer: [phone]

## 2016-02-16 NOTE — Progress Notes (Signed)
PROGRESS NOTE  [name]  [id number] DOB: 05-Mar-1976  DOA: 02/14/2016 PCP: Merrilee Seashore, MD   Brief Narrative:  40 year old female with PMH of ESRD on MWF HD, CIN III s/p LEEP, CAD status post cardiac 2012, type II DM/IDDM, HLD, HTN, PAD,? Chronic CHF (follows with Dr. Einar Gip) presented to [facility] ED on 02/14/20 with gradually worsening abdominal distention, fluctuating appetite, approximately 20 pound weight loss since April 2017, no reported change in bowel habits, compliant with dialysis. In ED noted to have clinical ascites and CT abdomen with contrast revealed small to moderate ascites and pericardial effusion. She denies dyspnea or chest pain. Admitted for further evaluation. Nephrology consulted.   Assessment & Plan:   Principal Problem:   Ascites Active Problems:   Essential hypertension, benign   End stage renal disease on dialysis ([place])   DM (diabetes mellitus), type 2 with renal complications (HCC)   Ascites - Unclear etiology. No peripheral edema - DD: ESRD/uremia with inadequate HD, decompensated CHF, infectious, inflammatory, malignancy versus other etiologies. - CT abdomen report as below was appreciated. Liver appears unremarkable but will check INR. - Patient states that she had an echo with her primary cardiologist a month ago, we will try to obtain report. Repeating echo. - Status post diagnostic and therapeutic paracentesis by IR 9/21: Ascitic fluid WBCs 4300, 82% neutrophils and 9% monocytes/macrophages, Gram stain negative, culture pending. Concern for SBP. Started IV ceftriaxone (as tolerated amoxicillin and penicillin. Keflex listed as allergy-states that this was as a child according to her mother's report.) - Cardiology input appreciated: Suspect biventricular failure with acute on chronic diastolic heart failure, pulmonary hypertension with RV failure, amyloid infiltrative cardiomyopathy. Have requested advanced heart failure team to see. - Nephrology  follow-up appreciated. Attempting volume management across dialysis.  Pericardial effusion - Management as outlined in ascites. Repeat echo. No clinical features of cardiac tamponade. - As per cardiology, moderate size without hemodynamic compromise.  ESRD on MWF HD - Nephrology consultation and follow-up appreciated.  Uncontrolled type II DM/IDDM - Patient missed last night's dose of 70/30 insulin. CBG/blood glucose 9/21 morning: 599. Ensure she gets her home dose of insulin's and monitor CBGs closely. May consider adding SSI. Check A1c: 11.2 suggesting very poor outpatient control. - CBGs well-controlled since last night.? Issues with compliance with all medications at home given good control of her diabetes in the hospital with stated home regimen.  Essential hypertension - Uncontrolled. Continue with amlodipine, clonidine, hydralazine, labetalol and Imdur. When necessary IV hydralazine. - Polypharmacy suggests refractory hypertension. As per discussion with her cardiologist, issues with compliance and persistently markedly elevated blood pressures. Attempt volume removal across dialysis and reassess blood pressure with her home regimen.  Hyperlipidemia - Continue statins.  CAD - Continue Plavix, statins. Asymptomatic of chest pain.  Weight loss - Unclear etiology.? Related to ascites.  Anemia - may be related to ESRD> follow periodically.    DVT prophylaxis: Heparin Code Status: Full Family Communication: None at bedside Disposition Plan: DC home when medically improved and pending evaluation.   Consultants:   Nephrology  IR  Cardiology  Procedures:   HD  3.4 L paracentesis by IR on 9/21.  Antimicrobials:   None    Subjective: Feels better after abdominal tap yesterday but still complains of abdominal soreness, mostly in the lower quadrants.  Objective:  Vitals:   02/16/16 1145 02/16/16 1159 02/16/16 1318 02/16/16 1328  BP: (!) 198/97 (!) 199/92 (!)  186/88 (!) 176/67  Pulse: 69 69 70 69  Resp:  17  18  Temp:  98.1 F (36.7 C)  98.1 F (36.7 C)  TempSrc:  Oral  Oral  SpO2:    100%  Weight:  90.9 kg (200 lb 6.4 oz)    Height:        Intake/Output Summary (Last 24 hours) at 02/16/16 1400 Last data filed at 02/16/16 1159  Gross per 24 hour  Intake                0 ml  Output             3000 ml  Net            -3000 ml   Filed Weights   02/15/16 0624 02/16/16 0750 02/16/16 1159  Weight: 94.9 kg (209 lb 4.8 oz) 93.9 kg (207 lb 0.2 oz) 90.9 kg (200 lb 6.4 oz)    Examination:  General exam: Pleasant young female lying comfortably supine in bed undergoing dialysis this morning. Respiratory system: Clear to auscultation. Respiratory effort normal. Cardiovascular system: S1 & S2 heard, RRR. No JVD, murmurs, rubs, gallops or clicks. No pedal edema. Gastrointestinal system: Abdomen is nondistended, soft and mild tenderness mostly in the lower quadrants without peritoneal signs. No organomegaly or masses felt. Normal bowel sounds heard. Ascites +. Not tense. No fluid thrill.  Central nervous system: Alert and oriented. No focal neurological deficits. Extremities: Symmetric 5 x 5 power. Skin: No rashes, lesions or ulcers Psychiatry: Judgement and insight appear normal. Mood & affect appropriate.     Data Reviewed: I have personally reviewed following labs and imaging studies  CBC:  Recent Labs Lab 02/14/16 1841 02/16/16 0832  WBC 8.1 7.0  HGB 9.7* 9.2*  HCT 32.5* 29.6*  MCV 93.1 90.5  PLT 222 [id number]   Basic Metabolic Panel:  Recent Labs Lab 02/14/16 1841 02/15/16 0849 02/16/16 0832  NA 137 132* 131*  K 3.7 4.4 4.4  CL 94* 90* 92*  CO2 30 29 27   GLUCOSE 149* 599* 79  BUN 18 27* 41*  CREATININE 4.01* 4.74* 6.17*  CALCIUM 9.0 9.1 9.0  PHOS  --   --  6.9*   GFR: Estimated Creatinine Clearance: 15.7 mL/min (by C-G formula based on SCr of 6.17 mg/dL (H)). Liver Function Tests:  Recent Labs Lab 02/14/16 1841  02/16/16 0832  AST 12*  --   ALT 11*  --   ALKPHOS 236*  --   BILITOT 0.8  --   PROT 6.2*  --   ALBUMIN 2.8* 2.3*    Recent Labs Lab 02/14/16 1841  LIPASE 30   No results for input(s): AMMONIA in the last 168 hours. Coagulation Profile:  Recent Labs Lab 02/15/16 1418  INR 1.24   Cardiac Enzymes: No results for input(s): CKTOTAL, CKMB, CKMBINDEX, TROPONINI in the last 168 hours. BNP (last 3 results) No results for input(s): PROBNP in the last 8760 hours. HbA1C:  Recent Labs  02/15/16 1418  HGBA1C 11.2*   CBG:  Recent Labs Lab 02/15/16 1724 02/15/16 2111 02/15/16 2244 02/16/16 0954 02/16/16 1326  GLUCAP 353* 109* 120* 86 151*   Lipid Profile: No results for input(s): CHOL, HDL, LDLCALC, TRIG, CHOLHDL, LDLDIRECT in the last 72 hours. Thyroid Function Tests: No results for input(s): TSH, T4TOTAL, FREET4, T3FREE, THYROIDAB in the last 72 hours. Anemia Panel: No results for input(s): VITAMINB12, FOLATE, FERRITIN, TIBC, IRON, RETICCTPCT in the last 72 hours.  Sepsis Labs:  Recent Labs Lab 02/15/16 0021  LATICACIDVEN 0.90    Recent Results (  from the past 240 hour(s))  Gram stain     Status: None   Collection Time: 02/15/16  1:41 PM  Result Value Ref Range Status   Specimen Description FLUID ABDOMEN PERITONEAL  Final   Special Requests NONE  Final   Gram Stain   Final    RARE WBC PRESENT, PREDOMINANTLY PMN NO ORGANISMS SEEN    Report Status 02/15/2016 FINAL  Final  MRSA PCR Screening     Status: None   Collection Time: 02/16/16  7:25 AM  Result Value Ref Range Status   MRSA by PCR NEGATIVE NEGATIVE Final    Comment:        The GeneXpert MRSA Assay (FDA approved for NASAL specimens only), is one component of a comprehensive MRSA colonization surveillance program. It is not intended to diagnose MRSA infection nor to guide or monitor treatment for MRSA infections.          Radiology Studies: Dg Chest 2 View  Result Date:  02/15/2016 CLINICAL DATA:  Patient reports fluid in the abdomen. History of CAD, hypertension, diabetes. History of dialysis. EXAM: CHEST  2 VIEW COMPARISON:  10/25/2015 FINDINGS: The heart is enlarged and stable in configuration. There are no focal consolidations or pleural effusions. No pulmonary edema. IMPRESSION: Stable cardiomegaly. Electronically Signed   By: Nolon Nations M.D.   On: 02/15/2016 13:14   Ct Abdomen Pelvis W Contrast  Result Date: 02/15/2016 CLINICAL DATA:  Acute onset of generalized abdominal distention, nausea and vomiting. Abdominal pain. Initial encounter. EXAM: CT ABDOMEN AND PELVIS WITH CONTRAST TECHNIQUE: Multidetector CT imaging of the abdomen and pelvis was performed using the standard protocol following bolus administration of intravenous contrast. CONTRAST:  100 mL ISOVUE-300 IOPAMIDOL (ISOVUE-300) INJECTION 61% COMPARISON:  MRI of the lumbar spine performed 11/02/2015, and CT of the abdomen and pelvis from 05/24/2014 FINDINGS: Lower chest: A small to moderate pericardial effusion is noted. Diffuse coronary artery calcifications are seen. The visualized lung bases appear grossly clear. Hepatobiliary: The liver is grossly unremarkable in appearance. The gallbladder is grossly unremarkable, though difficult to fully assess given adjacent ascites. The common bile duct remains normal in caliber. Pancreas: The pancreas is within normal limits. Spleen: The spleen is unremarkable in appearance. Adrenals/Urinary Tract: The adrenal glands are unremarkable in appearance. There is moderate renal atrophy bilaterally, with scattered small bilateral renal cysts and underlying vascular calcifications at both renal hila. There is no evidence of hydronephrosis. Stomach/Bowel: The stomach is unremarkable in appearance. The small bowel is within normal limits. The appendix is normal in caliber, without evidence of appendicitis. The colon is unremarkable in appearance. Vascular/Lymphatic:  Relatively diffuse calcification is seen along the abdominal aorta and its branches. The inferior vena cava is grossly unremarkable. No pelvic sidewall lymphadenopathy is seen. There is no evidence of retroperitoneal or inguinal lymphadenopathy. Reproductive: The bladder is mildly distended and within normal limits. The uterus is grossly unremarkable in appearance. The ovaries are relatively symmetric. No suspicious adnexal masses are seen. Other: Small to moderate volume ascites is seen within the abdomen and pelvis. No additional soft tissue abnormalities are seen. Musculoskeletal: No acute osseous abnormalities are identified. The visualized musculature is unremarkable in appearance. IMPRESSION: 1. Small to moderate volume ascites noted within the abdomen and pelvis. 2. Small to moderate pericardial effusion seen. 3. Diffuse coronary artery calcifications noted. 4. Aortic atherosclerosis. 5. Moderate bilateral renal atrophy, with scattered small bilateral renal cysts. Electronically Signed   By: Garald Balding M.D.   On: 02/15/2016 02:54  US Paracentesis  Result Date: 02/15/2016 INDICATION: End stage renal failure on hemodialysis. Ascites. Request for diagnostic and therapeutic paracentesis. EXAM: ULTRASOUND GUIDED RIGHT LATERAL ABDOMEN PARACENTESIS MEDICATIONS: 1% Lidocaine. COMPLICATIONS: None immediate. PROCEDURE: Informed written consent was obtained from the patient after a discussion of the risks, benefits and alternatives to treatment. A timeout was performed prior to the initiation of the procedure. Initial ultrasound scanning demonstrates a moderate amount of ascites within the right lateral abdomen. The right lateral abdomen was prepped and draped in the usual sterile fashion. 1% lidocaine with epinephrine was used for local anesthesia. Following this, a 19 gauge, 7-cm, Yueh catheter was introduced. An ultrasound image was saved for documentation purposes. The paracentesis was performed. The  catheter was removed and a dressing was applied. The patient tolerated the procedure well without immediate post procedural complication. FINDINGS: A total of approximately 3.4 liters of clear light orange fluid was removed. Samples were sent to the laboratory as requested by the clinical team. IMPRESSION: Successful ultrasound-guided paracentesis yielding 3.4 liters of peritoneal fluid. Read by:  Gareth Eagle, PA-C Electronically Signed   By: Corrie Mckusick D.O.   On: 02/15/2016 13:09        Scheduled Meds: . acetaminophen      . amLODipine  10 mg Oral Daily  . atorvastatin  40 mg Oral q1800  . cefTRIAXone (ROCEPHIN)  IV  1 g Intravenous Q24H  . cloNIDine  0.3 mg Oral TID  . clopidogrel  75 mg Oral QHS  . doxercalciferol      . doxercalciferol  6 mcg Intravenous Q M,W,F-HD  . [START ON 02/17/2016] heparin  3,000 Units Dialysis Once in dialysis  . heparin  5,000 Units Subcutaneous Q8H  . hydrALAZINE  50 mg Oral Q8H  . insulin aspart  0-9 Units Subcutaneous TID WC  . insulin aspart protamine- aspart  26 Units Subcutaneous Q supper  . insulin aspart protamine- aspart  28 Units Subcutaneous Q breakfast  . isosorbide mononitrate  60 mg Oral Daily  . labetalol  300 mg Oral TID  . sevelamer carbonate  5,600 mg Oral TID WC   Continuous Infusions:    LOS: 1 day    Time spent: 45 minutes.    [facility], MD [facility] Pager [phone]  If 7PM-7AM, please contact night-coverage [email or web address] Password [email or web address] 02/16/2016, 2:00 PM

## 2016-02-17 ENCOUNTER — Inpatient Hospital Stay (HOSPITAL_COMMUNITY): Payer: Medicare Other

## 2016-02-17 LAB — ECHOCARDIOGRAM COMPLETE
Height: 76 in
Weight: 3206.37 oz

## 2016-02-17 LAB — GLUCOSE, CAPILLARY
GLUCOSE-CAPILLARY: 144 mg/dL — AB (ref 65–99)
GLUCOSE-CAPILLARY: 188 mg/dL — AB (ref 65–99)
Glucose-Capillary: 89 mg/dL (ref 65–99)

## 2016-02-17 MED ORDER — ONDANSETRON HCL 4 MG/2ML IJ SOLN
4.0000 mg | Freq: Four times a day (QID) | INTRAMUSCULAR | Status: DC | PRN
  Administered 2016-02-18 – 2016-02-21 (×8): 4 mg via INTRAVENOUS
  Filled 2016-02-17 (×7): qty 2

## 2016-02-17 MED ORDER — HYDRALAZINE HCL 50 MG PO TABS
75.0000 mg | ORAL_TABLET | Freq: Three times a day (TID) | ORAL | Status: DC
  Administered 2016-02-17 – 2016-02-23 (×16): 75 mg via ORAL
  Filled 2016-02-17 (×18): qty 1

## 2016-02-17 MED ORDER — ISOSORBIDE MONONITRATE ER 60 MG PO TB24
120.0000 mg | ORAL_TABLET | Freq: Every day | ORAL | Status: DC
  Administered 2016-02-18 – 2016-02-24 (×7): 120 mg via ORAL
  Filled 2016-02-17 (×7): qty 2

## 2016-02-17 MED ORDER — BISACODYL 5 MG PO TBEC
5.0000 mg | DELAYED_RELEASE_TABLET | Freq: Every day | ORAL | Status: DC | PRN
  Administered 2016-02-17: 5 mg via ORAL
  Filled 2016-02-17: qty 1

## 2016-02-17 NOTE — Progress Notes (Signed)
[facility] Progress Note   Dialysis Orders: MWF NW  4h 25min   95.5kg  2/2 bath  P2   Hep 3000  AVF Hect 6 ug Mircera 225 ug q 2wk last 9/20 Auryxia 2 tab ac and renvela 6 ac Sensipar 60/d - no taking Ca 10, P 7.2 Hb 9.3   Assessment/Plan: 1. New onset ascites - seen by cariology and CHF team.  W/U in progress, echo pending, needs ^BP control.  2. HTN/ vol- some component of vol excess as we pulled 3kg off w/o any BP drops yest.  Titrate BP meds up per cards and cont to lower volume.  Is 4kg under dry wt today. 3. ^WBC in peritoneal fluid - pain improved after paracentesis, now coming back along w abd distension.  Peritoneal fluid cx's negative so far, on emp Rocephin due to [facility]. No good evidence infection causing pain yet.   4. ESRD HD MWF 5. Anorexia/ wt loss 6. Anemia - no need for ESA, last 225 dose 9/20, won't be due to 2 weeks hgb 9.2 - consistent with last outpt Hgb which has been trending down tsat 51% in August 7. 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  Alean Rinne 75 tid   Subjective:     Objective Vitals:   02/16/16 1328 02/16/16 2116 02/17/16 0500 02/17/16 0538  BP: (!) 176/67 (!) 186/72  (!) 159/59  Pulse: 69 74  69  Resp: 18 18  16   Temp: 98.1 F (36.7 C) 98.5 F (36.9 C) 98.1 F (36.7 C) 98.1 F (36.7 C)  TempSrc: Oral Oral  Oral  SpO2: 100% 100%  96%  Weight:      Height:       Physical Exam General: Heart: Lungs: Abdomen: Extremities: Dialysis Access:    Additional Objective Labs: Basic Metabolic Panel:  Recent Labs Lab 02/14/16 1841 02/15/16 0849 02/16/16 0832  NA 137 132* 131*  K 3.7 4.4 4.4  CL 94* 90* 92*  CO2 30 29 27   GLUCOSE 149* 599* 79  BUN 18 27* 41*  CREATININE 4.01*  4.74* 6.17*  CALCIUM 9.0 9.1 9.0  PHOS  --   --  6.9*   Liver Function Tests:  Recent Labs Lab 02/14/16 1841 02/16/16 0832  AST 12*  --   ALT 11*  --   ALKPHOS 236*  --   BILITOT 0.8  --   PROT 6.2*  --   ALBUMIN 2.8* 2.3*    Recent Labs Lab 02/14/16 1841  LIPASE 30   CBC:  Recent Labs Lab 02/14/16 1841 02/16/16 0832  WBC 8.1 7.0  HGB 9.7* 9.2*  HCT 32.5* 29.6*  MCV 93.1 90.5  PLT 222 234   Blood Culture    Component Value Date/Time   SDES FLUID ABDOMEN PERITONEAL 02/15/2016 1341   SDES FLUID ABDOMEN PERITONEAL 02/15/2016 1341   SPECREQUEST NONE 02/15/2016 1341   SPECREQUEST NONE 02/15/2016 1341   CULT NO GROWTH 1 DAY 02/15/2016 1341   REPTSTATUS PENDING 02/15/2016 1341   REPTSTATUS 02/15/2016 FINAL 02/15/2016 1341    CBG:  Recent Labs Lab 02/15/16 2244 02/16/16 0954 02/16/16 1326 02/16/16 2046 02/17/16 0754  GLUCAP 120* 86 151* 150* 144*  Lab Results  Component Value Date   INR 1.24 02/15/2016   INR 1.14 02/15/2015   INR 1.09 02/09/2015   Studies/Results: Dg Chest 2 View  Result Date: 02/15/2016 CLINICAL DATA:  Patient reports fluid in the abdomen. History of CAD, hypertension, diabetes. History of dialysis. EXAM: CHEST  2 VIEW COMPARISON:  10/25/2015 FINDINGS: The heart is enlarged and stable in configuration. There are no focal consolidations or pleural effusions. No pulmonary edema. IMPRESSION: Stable cardiomegaly. Electronically Signed   By: Nolon Nations M.D.   On: 02/15/2016 13:14   US Paracentesis  Result Date: 02/15/2016 INDICATION: End stage renal failure on hemodialysis. Ascites. Request for diagnostic and therapeutic paracentesis. EXAM: ULTRASOUND GUIDED RIGHT LATERAL ABDOMEN PARACENTESIS MEDICATIONS: 1% Lidocaine. COMPLICATIONS: None immediate. PROCEDURE: Informed written consent was obtained from the patient after a discussion of the risks, benefits and alternatives to treatment. A timeout was performed prior to the initiation  of the procedure. Initial ultrasound scanning demonstrates a moderate amount of ascites within the right lateral abdomen. The right lateral abdomen was prepped and draped in the usual sterile fashion. 1% lidocaine with epinephrine was used for local anesthesia. Following this, a 19 gauge, 7-cm, Yueh catheter was introduced. An ultrasound image was saved for documentation purposes. The paracentesis was performed. The catheter was removed and a dressing was applied. The patient tolerated the procedure well without immediate post procedural complication. FINDINGS: A total of approximately 3.4 liters of clear light orange fluid was removed. Samples were sent to the laboratory as requested by the clinical team. IMPRESSION: Successful ultrasound-guided paracentesis yielding 3.4 liters of peritoneal fluid. Read by:  Gareth Eagle, PA-C Electronically Signed   By: Corrie Mckusick D.O.   On: 02/15/2016 13:09   Medications:   . amLODipine  10 mg Oral Daily  . atorvastatin  40 mg Oral q1800  . cefTRIAXone (ROCEPHIN)  IV  1 g Intravenous Q24H  . cloNIDine  0.3 mg Oral TID  . clopidogrel  75 mg Oral QHS  . doxercalciferol  6 mcg Intravenous Q M,W,F-HD  . heparin  3,000 Units Dialysis Once in dialysis  . heparin  5,000 Units Subcutaneous Q8H  . hydrALAZINE  50 mg Oral Q8H  . insulin aspart  0-9 Units Subcutaneous TID WC  . insulin aspart protamine- aspart  26 Units Subcutaneous Q supper  . insulin aspart protamine- aspart  28 Units Subcutaneous Q breakfast  . isosorbide mononitrate  60 mg Oral Daily  . labetalol  300 mg Oral TID  . sevelamer carbonate  5,600 mg Oral TID WC

## 2016-02-17 NOTE — Progress Notes (Signed)
  Echocardiogram 2D Echocardiogram has been performed.  Jennette Dubin 02/17/2016, 3:22 PM

## 2016-02-17 NOTE — Progress Notes (Signed)
PROGRESS NOTE  [name]  [id number] DOB: [birth year]  DOA: 02/14/2016 PCP: Merrilee Seashore, MD   Brief Narrative:  40 year old female with PMH of ESRD on MWF HD, CIN III s/p LEEP, CAD status post cardiac 2012, type II DM/IDDM, HLD, HTN, PAD,? Chronic CHF (follows with Dr. Einar Gip) presented to [facility] ED on 02/14/20 with gradually worsening abdominal distention, fluctuating appetite, approximately 20 pound weight loss since April 2017, no reported change in bowel habits, compliant with dialysis. In ED noted to have clinical ascites and CT abdomen with contrast revealed small to moderate ascites and pericardial effusion. She denies dyspnea or chest pain. Admitted for further evaluation. Nephrology consulted.   Assessment & Plan:   Principal Problem:   Ascites Active Problems:   Essential hypertension, benign   End stage renal disease on dialysis ([place])   DM (diabetes mellitus), type 2 with renal complications (HCC)   Ascites - Unclear etiology. No peripheral edema - DD: ESRD/uremia with inadequate HD, decompensated CHF, infectious, inflammatory, malignancy versus other etiologies. - CT abdomen report as below was appreciated. Liver appears unremarkable & INR/1.24. - Status post diagnostic and therapeutic paracentesis by IR 9/21: Ascitic fluid WBCs 4300, 82% neutrophils and 9% monocytes/macrophages, Gram stain negative, culture negative to date. Concern for SBP. Started IV ceftriaxone and tolerated. - Cardiology input appreciated: Suspect biventricular failure with acute on chronic diastolic heart failure, pulmonary hypertension with RV failure, amyloid infiltrative cardiomyopathy.  - Advanced heart failure team input appreciated: Control severe HTN and consider adding Doxazosin, suspect severe LVH related to hypertensive cardiomyopathy and not amyloid but will await echo/not candidate for C MRI, RHF, ascites and albumin suggestive of cardiac cirrhosis but liver okay on CT. - Nephrology  follow-up appreciated. Attempting volume management across dialysis. - States worsening abdominal distention and may be reaccumulating. Follow. - Ascitic fluid cytology: No malignant cells, reactive mesothelial cells and abundant acute inflammation present.  Pericardial effusion - Management as outlined in ascites. Repeat echo. No clinical features of cardiac tamponade. - As per cardiology, moderate size without hemodynamic compromise.  ESRD on MWF HD - Nephrology consultation and follow-up appreciated.  Uncontrolled type II DM/IDDM - Patient missed last night's dose of 70/30 insulin. CBG/blood glucose 9/21 morning: 599. Ensure she gets her home dose of insulin's and monitor CBGs closely. May consider adding SSI. Check A1c: 11.2 suggesting very poor outpatient control. - CBGs well-controlled since last night.? Issues with compliance with all medications at home given good control of her diabetes in the hospital with stated home regimen.  Essential hypertension - Uncontrolled. Continue with amlodipine, clonidine, hydralazine, labetalol and Imdur. When necessary IV hydralazine. - Polypharmacy suggests refractory hypertension. As per discussion with her cardiologist, issues with compliance and persistently markedly elevated blood pressures. Attempt volume removal across dialysis and reassess blood pressure with her home regimen. May consider adding doxazosin.  Hyperlipidemia - Continue statins.  CAD - Continue Plavix, statins. Asymptomatic of chest pain.  Weight loss - Unclear etiology.? Related to ascites.  Anemia - may be related to ESRD> follow periodically.  Constipation - Takes Dulcolax by mouth at home when necessary.  DVT prophylaxis: Heparin Code Status: Full Family Communication: None at bedside Disposition Plan: DC home when medically improved and pending evaluation.   Consultants:   Nephrology  IR  Cardiology  Advanced heart failure team.  Procedures:    HD  3.4 L paracentesis by IR on 9/21.  Antimicrobials:   None    Subjective: Constipation, last BM 5 days ago. Abdominal distention coming  back.  Objective:  Vitals:   02/16/16 1328 02/16/16 2116 02/17/16 0500 02/17/16 0538  BP: (!) 176/67 (!) 186/72  (!) 159/59  Pulse: 69 74  69  Resp: 18 18  16   Temp: 98.1 F (36.7 C) 98.5 F (36.9 C) 98.1 F (36.7 C) 98.1 F (36.7 C)  TempSrc: Oral Oral  Oral  SpO2: 100% 100%  96%  Weight:      Height:        Intake/Output Summary (Last 24 hours) at 02/17/16 0730 Last data filed at 02/17/16 [id number]  Gross per 24 hour  Intake              120 ml  Output             3000 ml  Net            -2880 ml   Filed Weights   02/15/16 0624 02/16/16 0750 02/16/16 1159  Weight: 94.9 kg (209 lb 4.8 oz) 93.9 kg (207 lb 0.2 oz) 90.9 kg (200 lb 6.4 oz)    Examination:  General exam: Pleasant young female sitting up comfortably in chair this morning. Respiratory system: Clear to auscultation. Respiratory effort normal. Cardiovascular system: S1 & S2 heard, RRR. No JVD, murmurs, rubs, gallops or clicks. No pedal edema. Gastrointestinal system: Abdomen is moderately distended, soft and mild tenderness mostly in the lower quadrants without peritoneal signs. No organomegaly or masses felt. Normal bowel sounds heard. Ascites +. Not tense. No fluid thrill.  Central nervous system: Alert and oriented. No focal neurological deficits. Extremities: Symmetric 5 x 5 power. Skin: No rashes, lesions or ulcers Psychiatry: Judgement and insight appear normal. Mood & affect appropriate.     Data Reviewed: I have personally reviewed following labs and imaging studies  CBC:  Recent Labs Lab 02/14/16 1841 02/16/16 0832  WBC 8.1 7.0  HGB 9.7* 9.2*  HCT 32.5* 29.6*  MCV 93.1 90.5  PLT 222 [id number]   Basic Metabolic Panel:  Recent Labs Lab 02/14/16 1841 02/15/16 0849 02/16/16 0832  NA 137 132* 131*  K 3.7 4.4 4.4  CL 94* 90* 92*  CO2 30 29 27    GLUCOSE 149* 599* 79  BUN 18 27* 41*  CREATININE 4.01* 4.74* 6.17*  CALCIUM 9.0 9.1 9.0  PHOS  --   --  6.9*   GFR: Estimated Creatinine Clearance: 15.7 mL/min (by C-G formula based on SCr of 6.17 mg/dL (H)). Liver Function Tests:  Recent Labs Lab 02/14/16 1841 02/16/16 0832  AST 12*  --   ALT 11*  --   ALKPHOS 236*  --   BILITOT 0.8  --   PROT 6.2*  --   ALBUMIN 2.8* 2.3*    Recent Labs Lab 02/14/16 1841  LIPASE 30   No results for input(s): AMMONIA in the last 168 hours. Coagulation Profile:  Recent Labs Lab 02/15/16 1418  INR 1.24   Cardiac Enzymes: No results for input(s): CKTOTAL, CKMB, CKMBINDEX, TROPONINI in the last 168 hours. BNP (last 3 results) No results for input(s): PROBNP in the last 8760 hours. HbA1C:  Recent Labs  02/15/16 1418  HGBA1C 11.2*   CBG:  Recent Labs Lab 02/15/16 2111 02/15/16 2244 02/16/16 0954 02/16/16 1326 02/16/16 2046  GLUCAP 109* 120* 86 151* 150*   Lipid Profile: No results for input(s): CHOL, HDL, LDLCALC, TRIG, CHOLHDL, LDLDIRECT in the last 72 hours. Thyroid Function Tests: No results for input(s): TSH, T4TOTAL, FREET4, T3FREE, THYROIDAB in the last 72 hours. Anemia Panel: No  results for input(s): VITAMINB12, FOLATE, FERRITIN, TIBC, IRON, RETICCTPCT in the last 72 hours.  Sepsis Labs:  Recent Labs Lab 02/15/16 0021  LATICACIDVEN 0.90    Recent Results (from the past 240 hour(s))  Culture, body fluid-bottle     Status: None (Preliminary result)   Collection Time: 02/15/16  1:41 PM  Result Value Ref Range Status   Specimen Description FLUID ABDOMEN PERITONEAL  Final   Special Requests NONE  Final   Culture NO GROWTH 1 DAY  Final   Report Status PENDING  Incomplete  Gram stain     Status: None   Collection Time: 02/15/16  1:41 PM  Result Value Ref Range Status   Specimen Description FLUID ABDOMEN PERITONEAL  Final   Special Requests NONE  Final   Gram Stain   Final    RARE WBC PRESENT,  PREDOMINANTLY PMN NO ORGANISMS SEEN    Report Status 02/15/2016 FINAL  Final  MRSA PCR Screening     Status: None   Collection Time: 02/16/16  7:25 AM  Result Value Ref Range Status   MRSA by PCR NEGATIVE NEGATIVE Final    Comment:        The GeneXpert MRSA Assay (FDA approved for NASAL specimens only), is one component of a comprehensive MRSA colonization surveillance program. It is not intended to diagnose MRSA infection nor to guide or monitor treatment for MRSA infections.          Radiology Studies: Dg Chest 2 View  Result Date: 02/15/2016 CLINICAL DATA:  Patient reports fluid in the abdomen. History of CAD, hypertension, diabetes. History of dialysis. EXAM: CHEST  2 VIEW COMPARISON:  10/25/2015 FINDINGS: The heart is enlarged and stable in configuration. There are no focal consolidations or pleural effusions. No pulmonary edema. IMPRESSION: Stable cardiomegaly. Electronically Signed   By: Norva Pavlov M.D.   On: 02/15/2016 13:14   US Paracentesis  Result Date: 02/15/2016 INDICATION: End stage renal failure on hemodialysis. Ascites. Request for diagnostic and therapeutic paracentesis. EXAM: ULTRASOUND GUIDED RIGHT LATERAL ABDOMEN PARACENTESIS MEDICATIONS: 1% Lidocaine. COMPLICATIONS: None immediate. PROCEDURE: Informed written consent was obtained from the patient after a discussion of the risks, benefits and alternatives to treatment. A timeout was performed prior to the initiation of the procedure. Initial ultrasound scanning demonstrates a moderate amount of ascites within the right lateral abdomen. The right lateral abdomen was prepped and draped in the usual sterile fashion. 1% lidocaine with epinephrine was used for local anesthesia. Following this, a 19 gauge, 7-cm, Yueh catheter was introduced. An ultrasound image was saved for documentation purposes. The paracentesis was performed. The catheter was removed and a dressing was applied. The patient tolerated the  procedure well without immediate post procedural complication. FINDINGS: A total of approximately 3.4 liters of clear light orange fluid was removed. Samples were sent to the laboratory as requested by the clinical team. IMPRESSION: Successful ultrasound-guided paracentesis yielding 3.4 liters of peritoneal fluid. Read by:  Corrin Parker, PA-C Electronically Signed   By: Gilmer Mor D.O.   On: 02/15/2016 13:09        Scheduled Meds: . amLODipine  10 mg Oral Daily  . atorvastatin  40 mg Oral q1800  . cefTRIAXone (ROCEPHIN)  IV  1 g Intravenous Q24H  . cloNIDine  0.3 mg Oral TID  . clopidogrel  75 mg Oral QHS  . doxercalciferol  6 mcg Intravenous Q M,W,F-HD  . heparin  3,000 Units Dialysis Once in dialysis  . heparin  5,000 Units Subcutaneous  Q8H  . hydrALAZINE  50 mg Oral Q8H  . insulin aspart  0-9 Units Subcutaneous TID WC  . insulin aspart protamine- aspart  26 Units Subcutaneous Q supper  . insulin aspart protamine- aspart  28 Units Subcutaneous Q breakfast  . isosorbide mononitrate  60 mg Oral Daily  . labetalol  300 mg Oral TID  . sevelamer carbonate  5,600 mg Oral TID WC   Continuous Infusions:    LOS: 2 days    Time spent: 25 minutes.    [facility], MD [facility] Pager [phone]  If 7PM-7AM, please contact night-coverage [email or web address] Password [email or web address] 02/17/2016, 7:30 AM

## 2016-02-18 LAB — GLUCOSE, CAPILLARY
GLUCOSE-CAPILLARY: 173 mg/dL — AB (ref 65–99)
GLUCOSE-CAPILLARY: 125 mg/dL — AB (ref 65–99)
Glucose-Capillary: 165 mg/dL — ABNORMAL HIGH (ref 65–99)
Glucose-Capillary: 98 mg/dL (ref 65–99)

## 2016-02-18 LAB — HEPATITIS PANEL, ACUTE
HEP B C IGM: NEGATIVE
HEP B S AG: NEGATIVE
Hep A IgM: NEGATIVE

## 2016-02-18 LAB — BASIC METABOLIC PANEL
Anion gap: 12 (ref 5–15)
BUN: 40 mg/dL — AB (ref 6–20)
CHLORIDE: 93 mmol/L — AB (ref 101–111)
CO2: 30 mmol/L (ref 22–32)
CREATININE: 6.31 mg/dL — AB (ref 0.44–1.00)
Calcium: 9.3 mg/dL (ref 8.9–10.3)
GFR, EST AFRICAN AMERICAN: 9 mL/min — AB (ref >60)
GFR, EST NON AFRICAN AMERICAN: 7 mL/min — AB (ref >60)
Glucose, Bld: 164 mg/dL — ABNORMAL HIGH (ref 65–99)
Potassium: 4.4 mmol/L (ref 3.5–5.1)
SODIUM: 135 mmol/L (ref 135–145)

## 2016-02-18 LAB — PHOSPHORUS: PHOSPHORUS: 6.8 mg/dL — AB (ref 2.5–4.6)

## 2016-02-18 MED ORDER — DOCUSATE SODIUM 100 MG PO CAPS
100.0000 mg | ORAL_CAPSULE | Freq: Two times a day (BID) | ORAL | Status: DC
  Administered 2016-02-18 – 2016-02-24 (×12): 100 mg via ORAL
  Filled 2016-02-18 (×12): qty 1

## 2016-02-18 MED ORDER — BISACODYL 5 MG PO TBEC
20.0000 mg | DELAYED_RELEASE_TABLET | Freq: Every day | ORAL | Status: DC | PRN
  Administered 2016-02-18 – 2016-02-20 (×2): 20 mg via ORAL
  Filled 2016-02-18 (×2): qty 4

## 2016-02-18 MED ORDER — BISACODYL 10 MG RE SUPP
10.0000 mg | Freq: Every day | RECTAL | Status: DC | PRN
  Administered 2016-02-18: 10 mg via RECTAL
  Filled 2016-02-18: qty 1

## 2016-02-18 MED ORDER — SEVELAMER CARBONATE 800 MG PO TABS
3200.0000 mg | ORAL_TABLET | Freq: Three times a day (TID) | ORAL | Status: DC
  Administered 2016-02-18 – 2016-02-24 (×13): 3200 mg via ORAL
  Filled 2016-02-18 (×15): qty 4

## 2016-02-18 MED ORDER — WHITE PETROLATUM GEL
Status: AC
Start: 2016-02-18 — End: 2016-02-18
  Administered 2016-02-18: 1
  Filled 2016-02-18: qty 1

## 2016-02-18 NOTE — Progress Notes (Signed)
[facility] Progress Note   Dialysis Orders: MWF NW  4h 70min   95.5kg  2/2 bath  P2   Hep 3000  AVF Hect 6 ug Mircera 225 ug q 2wk last 9/20 Auryxia 2 tab ac and renvela 6 ac Sensipar 60/d - no taking Ca 10, P 7.2 Hb 9.3   Assessment/Plan: 1. New onset ascites - seen by cariology and CHF team.  W/U in progress, ECHO showing severe pulm HTN, grade 3 diast dysfunction. Hx of prolonged severe HTN.  2. HTN/ vol- 4 kg under dry wt and still BP's are up.  Has room for lowering dry wt further. BP's better.  3. ^WBC in peritoneal fluid - GS and peritoneal fluid cx's negative so far, on emp Rocephin due to [facility] 4. ESRD HD MWF 5. Anorexia/ wt loss 6. Anemia - no need for ESA, last 225 dose 9/20, won't be due to 2 weeks hgb 9.2 - consistent with last outpt Hgb which has been trending down tsat 51% in August 7. 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    Subjective:     Objective Vitals:   02/17/16 1811 02/17/16 2153 02/18/16 0614 02/18/16 0930  BP: (!) 182/66 (!) 172/66 (!) 147/60 (!) 168/69  Pulse: 70 70 65 67  Resp:  18 18   Temp:  98.4 F (36.9 C) 98.2 F (36.8 C)   TempSrc:   Oral   SpO2:  97% 93%   Weight:      Height:       Physical Exam General: Heart: Lungs: Abdomen: Extremities: Dialysis Access:    Additional Objective Labs: Basic Metabolic Panel:  Recent Labs Lab 02/15/16 0849 02/16/16 0832 02/18/16 0535  NA 132* 131* 135  K 4.4 4.4 4.4  CL 90* 92* 93*  CO2 29 27 30   GLUCOSE [id number]* 79 164*  BUN 27* 41* 40*  CREATININE 4.74* 6.17* 6.31*  CALCIUM 9.1 9.0 9.3  PHOS  --  6.9* 6.8*   Liver Function Tests:  Recent Labs Lab 02/14/16 1841 02/16/16 0832  AST 12*  --    ALT 11*  --   ALKPHOS 236*  --   BILITOT 0.8  --   PROT 6.2*  --   ALBUMIN 2.8* 2.3*    Recent Labs Lab 02/14/16 1841  LIPASE 30   CBC:  Recent Labs Lab 02/14/16 1841 02/16/16 0832  WBC 8.1 7.0  HGB 9.7* 9.2*  HCT 32.5* 29.6*  MCV 93.1 90.5  PLT 222 234   Blood Culture    Component Value Date/Time   SDES FLUID ABDOMEN PERITONEAL 02/15/2016 1341   SDES FLUID ABDOMEN PERITONEAL 02/15/2016 1341   SPECREQUEST NONE 02/15/2016 1341   SPECREQUEST NONE 02/15/2016 1341   CULT NO GROWTH 2 DAYS 02/15/2016 1341   REPTSTATUS PENDING 02/15/2016 1341   REPTSTATUS 02/15/2016 FINAL 02/15/2016 1341    CBG:  Recent Labs Lab 02/16/16 2046 02/17/16 0754 02/17/16 1211 02/17/16 1700 02/18/16 0754  GLUCAP 150* 144* 89 188* 165*   Lab Results  Component Value Date   INR 1.24 02/15/2016   INR 1.14 02/15/2015   INR 1.09 02/09/2015   Studies/Results: No results  found. Medications:   . amLODipine  10 mg Oral Daily  . atorvastatin  40 mg Oral q1800  . cefTRIAXone (ROCEPHIN)  IV  1 g Intravenous Q24H  . cloNIDine  0.3 mg Oral TID  . clopidogrel  75 mg Oral QHS  . docusate sodium  100 mg Oral BID  . doxercalciferol  6 mcg Intravenous Q M,W,F-HD  . heparin  5,000 Units Subcutaneous Q8H  . hydrALAZINE  75 mg Oral Q8H  . insulin aspart  0-9 Units Subcutaneous TID WC  . insulin aspart protamine- aspart  26 Units Subcutaneous Q supper  . insulin aspart protamine- aspart  28 Units Subcutaneous Q breakfast  . isosorbide mononitrate  120 mg Oral Daily  . labetalol  300 mg Oral TID  . sevelamer carbonate  3,200 mg Oral TID WC

## 2016-02-18 NOTE — Progress Notes (Signed)
PROGRESS NOTE  AAIRA BODILY  [id number] DOB: January 21, 1976  DOA: 02/14/2016 PCP: Merrilee Seashore, MD   Brief Narrative:  40 year old female with PMH of ESRD on MWF HD, CIN III s/p LEEP, CAD status post cardiac 2012, type II DM/IDDM, HLD, HTN, PAD,? Chronic CHF (follows with Dr. Einar Gip) presented to [facility] ED on 02/14/20 with gradually worsening abdominal distention, fluctuating appetite, approximately 20 pound weight loss since April 2017, no reported change in bowel habits, compliant with dialysis. In ED noted to have clinical ascites and CT abdomen with contrast revealed small to moderate ascites and pericardial effusion. She denies dyspnea or chest pain. Admitted for further evaluation. Nephrology consulted.   Assessment & Plan:   Principal Problem:   Ascites Active Problems:   Essential hypertension, benign   End stage renal disease on dialysis ([place])   DM (diabetes mellitus), type 2 with renal complications (HCC)   Ascites - Unclear etiology. No peripheral edema - DD: ESRD/uremia with inadequate HD, decompensated CHF, infectious, inflammatory, malignancy versus other etiologies. - CT abdomen report as below was appreciated. Liver appears unremarkable & INR/1.24. - Status post diagnostic and therapeutic paracentesis by IR 9/21: Ascitic fluid WBCs 4300, 82% neutrophils and 9% monocytes/macrophages, Gram stain negative, culture negative to date. Concern for SBP. Started IV ceftriaxone and tolerated. - Cardiology input appreciated: Suspect biventricular failure with acute on chronic diastolic heart failure, pulmonary hypertension with RV failure, amyloid infiltrative cardiomyopathy.  - Advanced heart failure team input appreciated: Control severe HTN and consider adding Doxazosin, suspect severe LVH related to hypertensive cardiomyopathy and not amyloid but will await echo/not candidate for C MRI, RHF, ascites and albumin suggestive of cardiac cirrhosis but liver okay on CT. - Nephrology  follow-up appreciated. Attempting volume management across dialysis. - States worsening abdominal distention and may be reaccumulating. Follow. - Ascitic fluid cytology: No malignant cells, reactive mesothelial cells and abundant acute inflammation present. Cultures negative to date.  Pericardial effusion - Management as outlined in ascites. Repeat echo. No clinical features of cardiac tamponade. - As per cardiology, moderate size without hemodynamic compromise.  ESRD on MWF HD - Nephrology consultation and follow-up appreciated.  Uncontrolled type II DM/IDDM - Patient missed last night's dose of 70/30 insulin. CBG/blood glucose 9/21 morning: 599. Ensure she gets her home dose of insulin's and monitor CBGs closely. May consider adding SSI. Check A1c: 11.2 suggesting very poor outpatient control. - CBGs reasonably controlled over the last couple days.? Issues with compliance with all medications at home given good control of her diabetes in the hospital with stated home regimen.  Essential hypertension - Uncontrolled. Continue with amlodipine, clonidine, hydralazine, labetalol and Imdur. When necessary IV hydralazine. - Polypharmacy suggests refractory hypertension. As per discussion with her cardiologist, issues with compliance and persistently markedly elevated blood pressures. Attempt volume removal across dialysis and reassess blood pressure with her home regimen. May consider adding doxazosin.  Hyperlipidemia - Continue statins.  CAD - Continue Plavix, statins. Asymptomatic of chest pain.  Weight loss - Unclear etiology.? Related to ascites.  Anemia - may be related to ESRD> follow periodically.  Constipation - Patient states that she takes Dulcolax 4 tabs as needed and Colace at home. No results with Dulcolax 1 tablet. Try home regimen. When necessary Dulcolax suppositories.  DVT prophylaxis: Heparin Code Status: Full Family Communication: None at bedside Disposition Plan:  DC home when medically improved and pending evaluation.   Consultants:   Nephrology  IR  Cardiology  Advanced heart failure team.  Procedures:   HD  3.4 L paracentesis by IR on 9/21.  Antimicrobials:   None    Subjective: Constipation >no effect with 1 tablet of Dulcolax by mouth on 9/23. Takes more at home. Try home regimen. Abdomen feels sore. Gradually getting more distended.  Objective:  Vitals:   02/17/16 1811 02/17/16 2153 02/18/16 0614 02/18/16 0930  BP: (!) 182/66 (!) 172/66 (!) 147/60 (!) 168/69  Pulse: 70 70 65 67  Resp:  18 18   Temp:  98.4 F (36.9 C) 98.2 F (36.8 C)   TempSrc:   Oral   SpO2:  97% 93%   Weight:      Height:        Intake/Output Summary (Last 24 hours) at 02/18/16 1401 Last data filed at 02/18/16 [id number]  Gross per 24 hour  Intake               60 ml  Output                0 ml  Net               60 ml   Filed Weights   02/15/16 0624 02/16/16 0750 02/16/16 1159  Weight: 94.9 kg (209 lb 4.8 oz) 93.9 kg (207 lb 0.2 oz) 90.9 kg (200 lb 6.4 oz)    Examination:  General exam: Pleasant young female Lying comfortably in bed. Respiratory system: Clear to auscultation. Respiratory effort normal. Cardiovascular system: S1 & S2 heard, RRR. No JVD, murmurs, rubs, gallops or clicks. No pedal edema. Gastrointestinal system: Abdomen is moderately distended, soft and mild tenderness mostly in the lower quadrants without peritoneal signs. No organomegaly or masses felt. Normal bowel sounds heard. Ascites +. Not tense. No fluid thrill.  Central nervous system: Alert and oriented. No focal neurological deficits. Extremities: Symmetric 5 x 5 power. Skin: No rashes, lesions or ulcers Psychiatry: Judgement and insight appear normal. Mood & affect appropriate.     Data Reviewed: I have personally reviewed following labs and imaging studies  CBC:  Recent Labs Lab 02/14/16 1841 02/16/16 0832  WBC 8.1 7.0  HGB 9.7* 9.2*  HCT 32.5* 29.6*    MCV 93.1 90.5  PLT 222 [id number]   Basic Metabolic Panel:  Recent Labs Lab 02/14/16 1841 02/15/16 0849 02/16/16 0832 02/18/16 0535  NA 137 132* 131* 135  K 3.7 4.4 4.4 4.4  CL 94* 90* 92* 93*  CO2 30 29 27 30   GLUCOSE 149* 599* 79 164*  BUN 18 27* 41* 40*  CREATININE 4.01* 4.74* 6.17* 6.31*  CALCIUM 9.0 9.1 9.0 9.3  PHOS  --   --  6.9* 6.8*   GFR: Estimated Creatinine Clearance: 15.4 mL/min (by C-G formula based on SCr of 6.31 mg/dL (H)). Liver Function Tests:  Recent Labs Lab 02/14/16 1841 02/16/16 0832  AST 12*  --   ALT 11*  --   ALKPHOS 236*  --   BILITOT 0.8  --   PROT 6.2*  --   ALBUMIN 2.8* 2.3*    Recent Labs Lab 02/14/16 1841  LIPASE 30   No results for input(s): AMMONIA in the last 168 hours. Coagulation Profile:  Recent Labs Lab 02/15/16 1418  INR 1.24   Cardiac Enzymes: No results for input(s): CKTOTAL, CKMB, CKMBINDEX, TROPONINI in the last 168 hours. BNP (last 3 results) No results for input(s): PROBNP in the last 8760 hours. HbA1C:  Recent Labs  02/15/16 1418  HGBA1C 11.2*   CBG:  Recent Labs Lab 02/17/16 0754 02/17/16 1211  02/17/16 1700 02/18/16 0754 02/18/16 1211  GLUCAP 144* 89 188* 165* 98   Lipid Profile: No results for input(s): CHOL, HDL, LDLCALC, TRIG, CHOLHDL, LDLDIRECT in the last 72 hours. Thyroid Function Tests: No results for input(s): TSH, T4TOTAL, FREET4, T3FREE, THYROIDAB in the last 72 hours. Anemia Panel: No results for input(s): VITAMINB12, FOLATE, FERRITIN, TIBC, IRON, RETICCTPCT in the last 72 hours.  Sepsis Labs:  Recent Labs Lab 02/15/16 0021  LATICACIDVEN 0.90    Recent Results (from the past 240 hour(s))  Culture, body fluid-bottle     Status: None (Preliminary result)   Collection Time: 02/15/16  1:41 PM  Result Value Ref Range Status   Specimen Description FLUID ABDOMEN PERITONEAL  Final   Special Requests NONE  Final   Culture NO GROWTH 3 DAYS  Final   Report Status PENDING  Incomplete   Gram stain     Status: None   Collection Time: 02/15/16  1:41 PM  Result Value Ref Range Status   Specimen Description FLUID ABDOMEN PERITONEAL  Final   Special Requests NONE  Final   Gram Stain   Final    RARE WBC PRESENT, PREDOMINANTLY PMN NO ORGANISMS SEEN    Report Status 02/15/2016 FINAL  Final  MRSA PCR Screening     Status: None   Collection Time: 02/16/16  7:25 AM  Result Value Ref Range Status   MRSA by PCR NEGATIVE NEGATIVE Final    Comment:        The GeneXpert MRSA Assay (FDA approved for NASAL specimens only), is one component of a comprehensive MRSA colonization surveillance program. It is not intended to diagnose MRSA infection nor to guide or monitor treatment for MRSA infections.          Radiology Studies: No results found.      Scheduled Meds: . amLODipine  10 mg Oral Daily  . atorvastatin  40 mg Oral q1800  . cefTRIAXone (ROCEPHIN)  IV  1 g Intravenous Q24H  . cloNIDine  0.3 mg Oral TID  . clopidogrel  75 mg Oral QHS  . docusate sodium  100 mg Oral BID  . doxercalciferol  6 mcg Intravenous Q M,W,F-HD  . heparin  5,000 Units Subcutaneous Q8H  . hydrALAZINE  75 mg Oral Q8H  . insulin aspart  0-9 Units Subcutaneous TID WC  . insulin aspart protamine- aspart  26 Units Subcutaneous Q supper  . insulin aspart protamine- aspart  28 Units Subcutaneous Q breakfast  . isosorbide mononitrate  120 mg Oral Daily  . labetalol  300 mg Oral TID  . sevelamer carbonate  3,200 mg Oral TID WC   Continuous Infusions:    LOS: 3 days    [facility], MD [facility] Pager [phone]  If 7PM-7AM, please contact night-coverage [email or web address] Password [email or web address] 02/18/2016, 2:01 PM

## 2016-02-19 LAB — CBC
HCT: 29.8 % — ABNORMAL LOW (ref 36.0–46.0)
Hemoglobin: 9.2 g/dL — ABNORMAL LOW (ref 12.0–15.0)
MCH: 28.2 pg (ref 26.0–34.0)
MCHC: 30.9 g/dL (ref 30.0–36.0)
MCV: 91.4 fL (ref 78.0–100.0)
PLATELETS: 320 10*3/uL (ref 150–400)
RBC: 3.26 MIL/uL — ABNORMAL LOW (ref 3.87–5.11)
RDW: 17.4 % — AB (ref 11.5–15.5)
WBC: 9.7 10*3/uL (ref 4.0–10.5)

## 2016-02-19 LAB — RENAL FUNCTION PANEL
Albumin: 2.5 g/dL — ABNORMAL LOW (ref 3.5–5.0)
Anion gap: 14 (ref 5–15)
BUN: 50 mg/dL — AB (ref 6–20)
CHLORIDE: 92 mmol/L — AB (ref 101–111)
CO2: 26 mmol/L (ref 22–32)
CREATININE: 7.54 mg/dL — AB (ref 0.44–1.00)
Calcium: 9.6 mg/dL (ref 8.9–10.3)
GFR calc Af Amer: 7 mL/min — ABNORMAL LOW (ref >60)
GFR calc non Af Amer: 6 mL/min — ABNORMAL LOW (ref >60)
GLUCOSE: 143 mg/dL — AB (ref 65–99)
POTASSIUM: 5.1 mmol/L (ref 3.5–5.1)
Phosphorus: 7.1 mg/dL — ABNORMAL HIGH (ref 2.5–4.6)
Sodium: 132 mmol/L — ABNORMAL LOW (ref 135–145)

## 2016-02-19 LAB — GLUCOSE, CAPILLARY
GLUCOSE-CAPILLARY: 178 mg/dL — AB (ref 65–99)
GLUCOSE-CAPILLARY: 83 mg/dL (ref 65–99)
Glucose-Capillary: 168 mg/dL — ABNORMAL HIGH (ref 65–99)
Glucose-Capillary: 111 mg/dL — ABNORMAL HIGH (ref 65–99)
Glucose-Capillary: 152 mg/dL — ABNORMAL HIGH (ref 65–99)
Glucose-Capillary: 107 mg/dL — ABNORMAL HIGH (ref 65–99)

## 2016-02-19 MED ORDER — HEPARIN SODIUM (PORCINE) 1000 UNIT/ML DIALYSIS: 3000.0000 [IU] | Freq: Once | INTRAMUSCULAR | Status: DC

## 2016-02-19 MED ORDER — SODIUM CHLORIDE 0.9 % IV SOLN: 100.0000 mL | INTRAVENOUS | Status: DC | PRN

## 2016-02-19 MED ORDER — LIDOCAINE HCL (PF) 1 % IJ SOLN: 5.0000 mL | INTRAMUSCULAR | Status: DC | PRN

## 2016-02-19 MED ORDER — LIDOCAINE-PRILOCAINE 2.5-2.5 % EX CREA: 1.0000 "application " | TOPICAL_CREAM | CUTANEOUS | Status: DC | PRN

## 2016-02-19 MED ORDER — DOXERCALCIFEROL 4 MCG/2ML IV SOLN
INTRAVENOUS | Status: AC
  Filled 2016-02-19: qty 4

## 2016-02-19 MED ORDER — PENTAFLUOROPROP-TETRAFLUOROETH EX AERO: 1.0000 "application " | INHALATION_SPRAY | CUTANEOUS | Status: DC | PRN

## 2016-02-19 MED ORDER — HEPARIN SODIUM (PORCINE) 1000 UNIT/ML DIALYSIS: 1000.0000 [IU] | INTRAMUSCULAR | Status: DC | PRN

## 2016-02-19 MED ORDER — ALTEPLASE 2 MG IJ SOLR: 2.0000 mg | Freq: Once | INTRAMUSCULAR | Status: DC | PRN

## 2016-02-19 NOTE — Progress Notes (Signed)
Patient stated to this RN that she would be ending her tx at 82.  AMA form signed.  Will continue to monitor.

## 2016-02-19 NOTE — Progress Notes (Signed)
[facility] Progress Note   Dialysis Orders: MWF NW  4h 66min   95.5kg  2/2 bath  P2   Hep 3000  AVF Hect 6 ug Mircera 225 ug q 2wk last 9/20 Auryxia 2 tab ac and renvela 6 ac Sensipar 60/d - no taking Ca 10, P 7.2 Hb 9.3   Assessment/Plan: 1. New onset ascites - seen by cardiology and CHF team.  W/U in progress, ECHO showing severe pulm HTN, grade 3 diast dysfunction. Hx of prolonged severe HTN.  Agree with ANA and renal artery U/S 2. HTN/ vol- 4 kg under dry wt and still BP's are up.  Has room for lowering dry wt further, will challenge today. BP's better.  3. ^WBC in peritoneal fluid - GS and peritoneal fluid cx's negative so far, on emp Rocephin due to [facility] 4. ESRD HD MWF 5. Anorexia/ wt loss 6. Anemia - no need for ESA, last 225 dose 9/20, won't be due to 2 weeks hgb 9.2 - consistent with last outpt Hgb which has been trending down tsat 51% in August 7. 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  Last LVP Friday- pt feels that fluid has reaccumulated   Subjective:     Objective Vitals:   02/18/16 0930 02/18/16 1512 02/18/16 2133 02/19/16 0657  BP: (!) 168/69 (!) 138/58 (!) 149/62 (!) 160/59  Pulse: 67 71 65 69  Resp:  19 16 16   Temp:  98.3 F (36.8 C) 97.6 F (36.4 C) 98.2 F (36.8 C)  TempSrc:  Oral Oral Oral  SpO2:  91% 99% 94%  Weight:      Height:       Physical Exam General: chronically ill-appearing Heart: RRR, occasional atrial kick Lungs: bilateral crackles at bases Abdomen: + fluid wave, distended Extremities: trace LE edema Dialysis Access:  L AVF + bruit and thrill   Additional Objective Labs: Basic Metabolic Panel:  Recent Labs Lab 02/15/16 0849  02/16/16 0832 02/18/16 0535  NA 132* 131* 135  K 4.4 4.4 4.4  CL 90* 92* 93*  CO2 29 27 30   GLUCOSE 599* 79 164*  BUN 27* 41* 40*  CREATININE 4.74* 6.17* 6.31*  CALCIUM 9.1 9.0 9.3  PHOS  --  6.9* 6.8*   Liver Function Tests:  Recent Labs Lab 02/14/16 1841 02/16/16 0832  AST 12*  --   ALT 11*  --   ALKPHOS 236*  --   BILITOT 0.8  --   PROT 6.2*  --   ALBUMIN 2.8* 2.3*    Recent Labs Lab 02/14/16 1841  LIPASE 30   CBC:  Recent Labs Lab 02/14/16 1841 02/16/16 0832  WBC 8.1 7.0  HGB 9.7* 9.2*  HCT 32.5* 29.6*  MCV 93.1 90.5  PLT 222 234   Blood Culture    Component Value Date/Time   SDES FLUID ABDOMEN PERITONEAL 02/15/2016 1341   SDES FLUID ABDOMEN PERITONEAL 02/15/2016 1341   SPECREQUEST NONE 02/15/2016 1341   SPECREQUEST NONE 02/15/2016 1341   CULT NO GROWTH 3 DAYS 02/15/2016 1341   REPTSTATUS PENDING 02/15/2016 1341   REPTSTATUS 02/15/2016 FINAL 02/15/2016 1341    CBG:  Recent Labs  Lab 02/18/16 0754 02/18/16 1211 02/18/16 1703 02/18/16 2129 02/19/16 0815  GLUCAP 165* 98 173* 125* 111*   Lab Results  Component Value Date   INR 1.24 02/15/2016   INR 1.14 02/15/2015   INR 1.09 02/09/2015   Studies/Results: No results found. Medications:   . amLODipine  10 mg Oral Daily  . atorvastatin  40 mg Oral q1800  . cefTRIAXone (ROCEPHIN)  IV  1 g Intravenous Q24H  . cloNIDine  0.3 mg Oral TID  . clopidogrel  75 mg Oral QHS  . docusate sodium  100 mg Oral BID  . doxercalciferol  6 mcg Intravenous Q M,W,F-HD  . heparin  5,000 Units Subcutaneous Q8H  . hydrALAZINE  75 mg Oral Q8H  . insulin aspart  0-9 Units Subcutaneous TID WC  . insulin aspart protamine- aspart  26 Units Subcutaneous Q supper  . insulin aspart protamine- aspart  28 Units Subcutaneous Q breakfast  . isosorbide mononitrate  120 mg Oral Daily  . labetalol  300 mg Oral TID  . sevelamer carbonate  3,200 mg Oral TID WC      Madelon Lips MD [facility] pgr  [phone] cell [phone]

## 2016-02-19 NOTE — Progress Notes (Signed)
Dialysis treatment terminated early per patient, nephrology notified.  3500 mL ultrafiltrated and net fluid removal 3000 mL.    Patient status unchanged. Lung sounds clear to ausculation in all fields. Generalized edema. Cardiac: NSR.  Disconnected lines and removed needles.  Pressure held for 10 minutes and band aid/gauze dressing applied.  Report given to bedside RN, Apolonio Schneiders.

## 2016-02-19 NOTE — Care Management Important Message (Signed)
Important Message  Patient Details  Name: [name] MRN: [id number] Date of Birth: 1975-08-16   Medicare Important Message Given:  Yes    Odie Rauen Abena 02/19/2016, 11:28 AM

## 2016-02-19 NOTE — Progress Notes (Signed)
PROGRESS NOTE  [name]  [id number] DOB: 03-14-1976  DOA: 02/14/2016 PCP: Merrilee Seashore, MD   Brief Narrative:  40 year old female with PMH of ESRD on MWF HD, CIN III s/p LEEP, CAD status post cardiac 2012, type II DM/IDDM, HLD, HTN, PAD,? Chronic CHF (follows with Dr. Einar Gip) presented to [facility] ED on 02/14/20 with gradually worsening abdominal distention, fluctuating appetite, approximately 20 pound weight loss since April 2017, no reported change in bowel habits, compliant with dialysis. In ED noted to have clinical ascites and CT abdomen with contrast revealed small to moderate ascites and pericardial effusion. She denies dyspnea or chest pain. Admitted for further evaluation. Nephrology consulted.   Assessment & Plan:   Principal Problem:   Ascites Active Problems:   Essential hypertension, benign   End stage renal disease on dialysis ([place])   DM (diabetes mellitus), type 2 with renal complications (HCC)   Ascites - Unclear etiology. No peripheral edema - DD: ESRD/uremia with inadequate HD, decompensated CHF, infectious, inflammatory, malignancy versus other etiologies. - CT abdomen report as below was appreciated. Liver appears unremarkable & INR/1.24. - Status post diagnostic and therapeutic paracentesis by IR 9/21: Ascitic fluid WBCs 4300, 82% neutrophils and 9% monocytes/macrophages, Gram stain negative, culture negative to date. Concern for SBP. Started IV ceftriaxone and tolerated. - Cardiology input appreciated: Suspect biventricular failure with acute on chronic diastolic heart failure, pulmonary hypertension with RV failure, amyloid infiltrative cardiomyopathy.  - Advanced heart failure team input appreciated: Control severe HTN and consider adding Doxazosin, suspect severe LVH related to hypertensive cardiomyopathy and not amyloid but will await echo/not candidate for C MRI, RHF, ascites and albumin suggestive of cardiac cirrhosis but liver okay on CT. - Nephrology  follow-up appreciated. Attempting volume management across dialysis. - States worsening abdominal distention and may be reaccumulating. Follow. - Ascitic fluid cytology: No malignant cells, reactive mesothelial cells and abundant acute inflammation present. Cultures negative to date. - As discussed with cardiology, checking ANA. Also reports nausea and vomiting intermittently for the last 24 hours.? GI consultation. - Acute hepatitis panel: Negative.  Pericardial effusion - Management as outlined in ascites. Repeat echo. No clinical features of cardiac tamponade. - As per cardiology, moderate size without hemodynamic compromise.  ESRD on MWF HD - Nephrology consultation and follow-up appreciated.  Uncontrolled type II DM/IDDM - Patient missed last night's dose of 70/30 insulin. CBG/blood glucose 9/21 morning: 599. Ensure she gets her home dose of insulin's and monitor CBGs closely. May consider adding SSI. Check A1c: 11.2 suggesting very poor outpatient control. - CBGs reasonably controlled over the last couple days.? Issues with compliance with all medications at home given good control of her diabetes in the hospital with stated home regimen. - Patient refused to take her insulin's this morning 9/25.  Essential hypertension - Uncontrolled. Continue with amlodipine, clonidine, hydralazine, labetalol and Imdur. When necessary IV hydralazine. - Polypharmacy suggests refractory hypertension. As per discussion with her cardiologist, issues with compliance and persistently markedly elevated blood pressures. Attempt volume removal across dialysis and reassess blood pressure with her home regimen. May consider adding doxazosin. - As discussed with primary cardiologist, check renal artery Doppler to evaluate for resistant hypertension  Hyperlipidemia - Continue statins.  CAD - Continue Plavix, statins. Asymptomatic of chest pain.  Weight loss - Unclear etiology.? Related to  ascites.  Anemia - may be related to ESRD> follow periodically.  Constipation - Despite home dose of Dulcolax 20 mg and Dulcolax suppository, no BM. Willing to try enema today.  DVT  prophylaxis: Heparin Code Status: Full Family Communication: None at bedside Disposition Plan: DC home when medically improved and pending evaluation.   Consultants:   Nephrology  IR  Cardiology  Advanced heart failure team.  Procedures:   HD  3.4 L paracentesis by IR on 9/21.  Antimicrobials:   None    Subjective: Constipation >no effect with increased dose of Dulcolax or suppository. Lower abdominal discomfort. Nausea and intermittent nonbloody emesis 4 since yesterday. Seen this morning prior to dialysis.  Objective:  Vitals:   02/19/16 1135 02/19/16 1205 02/19/16 1235 02/19/16 1305  BP: (!) 170/84 (!) 162/81 (!) 182/89 (!) 182/89  Pulse: 69 67 67 68  Resp:      Temp:      TempSrc:      SpO2:      Weight:      Height:      Temperature 97.63F, respiratory rate 14/m, oxygen saturation 94%.  No intake or output data in the 24 hours ending 02/19/16 1341 Filed Weights   02/16/16 0750 02/16/16 1159 02/19/16 0936  Weight: 93.9 kg (207 lb 0.2 oz) 90.9 kg (200 lb 6.4 oz) 95.1 kg (209 lb 10.5 oz)    Examination:  General exam: Pleasant young female sitting up comfortably in bed. Seen with her female RN in room. Respiratory system: Clear to auscultation. Respiratory effort normal. Cardiovascular system: S1 & S2 heard, RRR. No JVD, murmurs, rubs, gallops or clicks. No pedal edema. Gastrointestinal system: Abdomen is moderately distended, soft and mild tenderness mostly in the lower quadrants without peritoneal signs. No organomegaly or masses felt. Normal bowel sounds heard. Ascites +. Not tense. No fluid thrill.  Central nervous system: Alert and oriented. No focal neurological deficits. Extremities: Symmetric 5 x 5 power. Skin: No rashes, lesions or ulcers Psychiatry: Judgement  and insight appear normal. Mood & affect appropriate.     Data Reviewed: I have personally reviewed following labs and imaging studies  CBC:  Recent Labs Lab 02/14/16 1841 02/16/16 0832 02/19/16 1002  WBC 8.1 7.0 9.7  HGB 9.7* 9.2* 9.2*  HCT 32.5* 29.6* 29.8*  MCV 93.1 90.5 91.4  PLT 222 234 [id number]   Basic Metabolic Panel:  Recent Labs Lab 02/14/16 1841 02/15/16 0849 02/16/16 0832 02/18/16 0535 02/19/16 0947  NA 137 132* 131* 135 132*  K 3.7 4.4 4.4 4.4 5.1  CL 94* 90* 92* 93* 92*  CO2 30 29 27 30 26   GLUCOSE 149* 599* 79 164* 143*  BUN 18 27* 41* 40* 50*  CREATININE 4.01* 4.74* 6.17* 6.31* 7.54*  CALCIUM 9.0 9.1 9.0 9.3 9.6  PHOS  --   --  6.9* 6.8* 7.1*   GFR: Estimated Creatinine Clearance: 12.9 mL/min (by C-G formula based on SCr of 7.54 mg/dL (H)). Liver Function Tests:  Recent Labs Lab 02/14/16 1841 02/16/16 0832 02/19/16 0947  AST 12*  --   --   ALT 11*  --   --   ALKPHOS 236*  --   --   BILITOT 0.8  --   --   PROT 6.2*  --   --   ALBUMIN 2.8* 2.3* 2.5*    Recent Labs Lab 02/14/16 1841  LIPASE 30   No results for input(s): AMMONIA in the last 168 hours. Coagulation Profile:  Recent Labs Lab 02/15/16 1418  INR 1.24   Cardiac Enzymes: No results for input(s): CKTOTAL, CKMB, CKMBINDEX, TROPONINI in the last 168 hours. BNP (last 3 results) No results for input(s): PROBNP in the last 8760  hours. HbA1C: No results for input(s): HGBA1C in the last 72 hours. CBG:  Recent Labs Lab 02/18/16 1211 02/18/16 1703 02/18/16 2129 02/19/16 0815 02/19/16 1009  GLUCAP 98 173* 125* 111* 152*   Lipid Profile: No results for input(s): CHOL, HDL, LDLCALC, TRIG, CHOLHDL, LDLDIRECT in the last 72 hours. Thyroid Function Tests: No results for input(s): TSH, T4TOTAL, FREET4, T3FREE, THYROIDAB in the last 72 hours. Anemia Panel: No results for input(s): VITAMINB12, FOLATE, FERRITIN, TIBC, IRON, RETICCTPCT in the last 72 hours.  Sepsis Labs:  Recent  Labs Lab 02/15/16 0021  LATICACIDVEN 0.90    Recent Results (from the past 240 hour(s))  Culture, body fluid-bottle     Status: None (Preliminary result)   Collection Time: 02/15/16  1:41 PM  Result Value Ref Range Status   Specimen Description FLUID ABDOMEN PERITONEAL  Final   Special Requests NONE  Final   Culture NO GROWTH 3 DAYS  Final   Report Status PENDING  Incomplete  Gram stain     Status: None   Collection Time: 02/15/16  1:41 PM  Result Value Ref Range Status   Specimen Description FLUID ABDOMEN PERITONEAL  Final   Special Requests NONE  Final   Gram Stain   Final    RARE WBC PRESENT, PREDOMINANTLY PMN NO ORGANISMS SEEN    Report Status 02/15/2016 FINAL  Final  MRSA PCR Screening     Status: None   Collection Time: 02/16/16  7:25 AM  Result Value Ref Range Status   MRSA by PCR NEGATIVE NEGATIVE Final    Comment:        The GeneXpert MRSA Assay (FDA approved for NASAL specimens only), is one component of a comprehensive MRSA colonization surveillance program. It is not intended to diagnose MRSA infection nor to guide or monitor treatment for MRSA infections.          Radiology Studies: No results found.      Scheduled Meds: . amLODipine  10 mg Oral Daily  . atorvastatin  40 mg Oral q1800  . cefTRIAXone (ROCEPHIN)  IV  1 g Intravenous Q24H  . cloNIDine  0.3 mg Oral TID  . clopidogrel  75 mg Oral QHS  . docusate sodium  100 mg Oral BID  . doxercalciferol      . doxercalciferol  6 mcg Intravenous Q M,W,F-HD  . [START ON 02/20/2016] heparin  3,000 Units Dialysis Once in dialysis  . heparin  5,000 Units Subcutaneous Q8H  . hydrALAZINE  75 mg Oral Q8H  . insulin aspart  0-9 Units Subcutaneous TID WC  . insulin aspart protamine- aspart  26 Units Subcutaneous Q supper  . insulin aspart protamine- aspart  28 Units Subcutaneous Q breakfast  . isosorbide mononitrate  120 mg Oral Daily  . labetalol  300 mg Oral TID  . sevelamer carbonate  3,200 mg  Oral TID WC   Continuous Infusions:    LOS: 4 days    [facility], MD [facility] Pager [phone]  If 7PM-7AM, please contact night-coverage [email or web address] Password [email or web address] 02/19/2016, 1:41 PM

## 2016-02-19 NOTE — Procedures (Signed)
Patient seen on Hemodialysis. QB 400, UF goal 3.5.  Still has ascites; may not be able to remove all but will keep challenging EDW in effort to remove. Treatment adjusted as needed.  Madelon Lips MD [facility]. Cell: [phone] Pager # [phone] 10:46 AM

## 2016-02-20 ENCOUNTER — Inpatient Hospital Stay (HOSPITAL_COMMUNITY): Payer: Medicare Other

## 2016-02-20 DIAGNOSIS — I771 Stricture of artery: Secondary | ICD-10-CM

## 2016-02-20 LAB — GLUCOSE, CAPILLARY
GLUCOSE-CAPILLARY: 134 mg/dL — AB (ref 65–99)
GLUCOSE-CAPILLARY: 116 mg/dL — AB (ref 65–99)
GLUCOSE-CAPILLARY: 66 mg/dL (ref 65–99)
Glucose-Capillary: 169 mg/dL — ABNORMAL HIGH (ref 65–99)
Glucose-Capillary: 86 mg/dL (ref 65–99)

## 2016-02-20 LAB — CULTURE, BODY FLUID W GRAM STAIN -BOTTLE

## 2016-02-20 LAB — CULTURE, BODY FLUID-BOTTLE: CULTURE: NO GROWTH

## 2016-02-20 MED ORDER — INSULIN ASPART PROT & ASPART (70-30 MIX) 100 UNIT/ML ~~LOC~~ SUSP
23.0000 [IU] | Freq: Every day | SUBCUTANEOUS | Status: DC
  Administered 2016-02-20 – 2016-02-23 (×3): 23 [IU] via SUBCUTANEOUS
  Filled 2016-02-20: qty 10

## 2016-02-20 MED ORDER — INSULIN ASPART PROT & ASPART (70-30 MIX) 100 UNIT/ML ~~LOC~~ SUSP
25.0000 [IU] | Freq: Every day | SUBCUTANEOUS | Status: DC
  Administered 2016-02-20 – 2016-02-22 (×2): 25 [IU] via SUBCUTANEOUS
  Filled 2016-02-20: qty 10

## 2016-02-20 MED ORDER — HYDROMORPHONE HCL 1 MG/ML IJ SOLN
1.0000 mg | Freq: Once | INTRAMUSCULAR | Status: AC
  Administered 2016-02-20: 1 mg via INTRAVENOUS
  Filled 2016-02-20: qty 1

## 2016-02-20 MED ORDER — TRAMADOL HCL 50 MG PO TABS
50.0000 mg | ORAL_TABLET | Freq: Four times a day (QID) | ORAL | Status: DC | PRN
  Administered 2016-02-20 – 2016-02-24 (×9): 50 mg via ORAL
  Filled 2016-02-20 (×9): qty 1

## 2016-02-20 MED ORDER — OXYCODONE HCL 5 MG PO TABS
5.0000 mg | ORAL_TABLET | Freq: Once | ORAL | Status: AC
  Administered 2016-02-21: 5 mg via ORAL
  Filled 2016-02-20: qty 1

## 2016-02-20 NOTE — Progress Notes (Signed)
PROGRESS NOTE  [name]  [id number] DOB: September 11, 1975  DOA: 02/14/2016 PCP: Merrilee Seashore, MD   Brief Narrative:  40 year old female with PMH of ESRD on MWF HD, CIN III s/p LEEP, CAD status post cardiac 2012, type II DM/IDDM, HLD, HTN, PAD,? Chronic CHF (follows with Dr. Einar Gip) presented to [facility] ED on 02/14/20 with gradually worsening abdominal distention, fluctuating appetite, approximately 20 pound weight loss since April 2017, no reported change in bowel habits, compliant with dialysis. In ED noted to have clinical ascites and CT abdomen with contrast revealed small to moderate ascites and pericardial effusion.Admitted for further evaluation. Underwent diagnostic and therapeutic paracentesis which showed significant neutrophilic leukocytosis concerning for SBP and hence treated with IV Rocephin but final culture negative-DC IV Rocephin. Cardiology/advanced heart failure team evaluated and suspect patient's ascites is from right-sided heart failure related to poorly controlled/resistant hypertension and restricted cardiomyopathy. Nephrology trying to reduce volume across dialysis. Slowly reaccumulating ascitic fluid and abdominal pain.    Assessment & Plan:   Principal Problem:   Ascites Active Problems:   Essential hypertension, benign   End stage renal disease on dialysis ([place])   DM (diabetes mellitus), type 2 with renal complications (HCC)   Ascites - Unclear etiology. No peripheral edema - DD: ESRD/uremia with inadequate HD, decompensated CHF, infectious, inflammatory, malignancy versus other etiologies. - CT abdomen report as below was appreciated. Liver appears unremarkable & INR/1.24. - Status post diagnostic and therapeutic paracentesis by IR 9/21: Ascitic fluid WBCs 4300, 82% neutrophils and 9% monocytes/macrophages, Gram stain negative, culture negative. Concern for SBP>Started IV ceftriaxone and now that culture is negative, DC on 9/26. - Cardiology input appreciated:  Suspect biventricular failure with acute on chronic diastolic heart failure, pulmonary hypertension with RV failure, amyloid infiltrative cardiomyopathy.  - Advanced heart failure team input appreciated: Control severe HTN and consider adding Doxazosin, suspect severe LVH related to hypertensive cardiomyopathy and not amyloid but will await echo/not candidate for C MRI, RHF, ascites and albumin suggestive of cardiac cirrhosis but liver okay on CT. - Nephrology follow-up appreciated. Attempting volume management across dialysis. - States worsening abdominal distention and may be reaccumulating. Follow. - Ascitic fluid cytology: No malignant cells, reactive mesothelial cells and abundant acute inflammation present. Cultures negative. - As discussed with cardiology, checking ANA-pending. Acute hepatitis panel: Negative. - ? Abdominal pain d/t bowel edema from fluid overload. - Consulted [facility] on 9/26 for expert opinion regarding any other possibilities for her ascites and evaluation of same.  Pericardial effusion - Management as outlined in ascites. Repeat echo. No clinical features of cardiac tamponade. - As per cardiology, moderate size without hemodynamic compromise.  ESRD on MWF HD - Nephrology consultation and follow-up appreciated.  Uncontrolled type II DM/IDDM - Patient missed last night's dose of 70/30 insulin. CBG/blood glucose 9/21 morning: 599. Ensure she gets her home dose of insulin's and monitor CBGs closely. May consider adding SSI. Check A1c: 11.2 suggesting very poor outpatient control. - CBGs reasonably controlled over the last couple days.? Issues with compliance with all medications at home given good control of her diabetes in the hospital with stated home regimen. - Due to very tight control of CBGs, reduced insulin's on 9/26  Essential hypertension - Uncontrolled. Continue with amlodipine, clonidine, hydralazine, labetalol and Imdur. When necessary IV hydralazine. -  Polypharmacy suggests refractory hypertension. As per discussion with her cardiologist, issues with compliance and persistently markedly elevated blood pressures. Attempt volume removal across dialysis and reassess blood pressure with her home regimen. May consider  adding doxazosin. - As discussed with primary cardiologist, check renal artery Doppler to evaluate for resistant hypertension-done and report pending.  Hyperlipidemia - Continue statins.  CAD - Continue Plavix, statins. Asymptomatic of chest pain.  Weight loss - Unclear etiology.? Related to ascites.  Anemia - may be related to ESRD> follow periodically.  Constipation - Despite home dose of Dulcolax 20 mg and Dulcolax suppository, no BM. Had BM after an enema on 9/25.  DVT prophylaxis: Heparin Code Status: Full Family Communication: None at bedside Disposition Plan: DC home when medically improved and pending evaluation.   Consultants:   Nephrology  IR  Cardiology  Advanced heart failure team.  Procedures:   HD  3.4 L paracentesis by IR on 9/21.  Antimicrobials:   None    Subjective: Continues to complain of lower quadrant abdominal pain, seems to be worsening. Dull aching. Constant with intermittent flares. Not much worsening of abdominal distention over the last 2 days. Had BM after enema on 9/25.  Objective:  Vitals:   02/20/16 0533 02/20/16 1113 02/20/16 1324 02/20/16 1511  BP: (!) 152/59 (!) 154/58 (!) 151/54 136/60  Pulse: 69 68 67 63  Resp: 18  18   Temp: 97.9 F (36.6 C)  98.1 F (36.7 C)   TempSrc: Oral     SpO2: 94%  98%   Weight:      Height:        Intake/Output Summary (Last 24 hours) at 02/20/16 1542 Last data filed at 02/20/16 1500  Gross per 24 hour  Intake              670 ml  Output                0 ml  Net              670 ml   Filed Weights   02/16/16 1159 02/19/16 0936 02/19/16 1335  Weight: 90.9 kg (200 lb 6.4 oz) 95.1 kg (209 lb 10.5 oz) 92.1 kg (203 lb 0.7 oz)     Examination:  General exam: Pleasant young female sitting up comfortably in bed. Seen with her female RN in room. Respiratory system: Clear to auscultation. Respiratory effort normal. Cardiovascular system: S1 & S2 heard, RRR. No JVD, murmurs, rubs, gallops or clicks. No pedal edema. Gastrointestinal system: Abdomen is moderately distended, soft and mild tenderness mostly in the lower quadrants without peritoneal signs. No organomegaly or masses felt. Normal bowel sounds heard. Ascites +. Not tense. No fluid thrill.  Central nervous system: Alert and oriented. No focal neurological deficits. Extremities: Symmetric 5 x 5 power. Skin: No rashes, lesions or ulcers Psychiatry: Judgement and insight appear normal. Mood & affect appropriate.     Data Reviewed: I have personally reviewed following labs and imaging studies  CBC:  Recent Labs Lab 02/14/16 1841 02/16/16 0832 02/19/16 1002  WBC 8.1 7.0 9.7  HGB 9.7* 9.2* 9.2*  HCT 32.5* 29.6* 29.8*  MCV 93.1 90.5 91.4  PLT 222 234 [id number]   Basic Metabolic Panel:  Recent Labs Lab 02/14/16 1841 02/15/16 0849 02/16/16 0832 02/18/16 0535 02/19/16 0947  NA 137 132* 131* 135 132*  K 3.7 4.4 4.4 4.4 5.1  CL 94* 90* 92* 93* 92*  CO2 30 29 27 30 26   GLUCOSE 149* 599* 79 164* 143*  BUN 18 27* 41* 40* 50*  CREATININE 4.01* 4.74* 6.17* 6.31* 7.54*  CALCIUM 9.0 9.1 9.0 9.3 9.6  PHOS  --   --  6.9* 6.8*  7.1*   GFR: Estimated Creatinine Clearance: 12.9 mL/min (by C-G formula based on SCr of 7.54 mg/dL (H)). Liver Function Tests:  Recent Labs Lab 02/14/16 1841 02/16/16 0832 02/19/16 0947  AST 12*  --   --   ALT 11*  --   --   ALKPHOS 236*  --   --   BILITOT 0.8  --   --   PROT 6.2*  --   --   ALBUMIN 2.8* 2.3* 2.5*    Recent Labs Lab 02/14/16 1841  LIPASE 30   No results for input(s): AMMONIA in the last 168 hours. Coagulation Profile:  Recent Labs Lab 02/15/16 1418  INR 1.24   Cardiac Enzymes: No results for  input(s): CKTOTAL, CKMB, CKMBINDEX, TROPONINI in the last 168 hours. BNP (last 3 results) No results for input(s): PROBNP in the last 8760 hours. HbA1C: No results for input(s): HGBA1C in the last 72 hours. CBG:  Recent Labs Lab 02/19/16 1421 02/19/16 1708 02/19/16 2139 02/20/16 0748 02/20/16 1111  GLUCAP 107* 178* 83 134* 169*   Lipid Profile: No results for input(s): CHOL, HDL, LDLCALC, TRIG, CHOLHDL, LDLDIRECT in the last 72 hours. Thyroid Function Tests: No results for input(s): TSH, T4TOTAL, FREET4, T3FREE, THYROIDAB in the last 72 hours. Anemia Panel: No results for input(s): VITAMINB12, FOLATE, FERRITIN, TIBC, IRON, RETICCTPCT in the last 72 hours.  Sepsis Labs:  Recent Labs Lab 02/15/16 0021  LATICACIDVEN 0.90    Recent Results (from the past 240 hour(s))  Culture, body fluid-bottle     Status: None   Collection Time: 02/15/16  1:41 PM  Result Value Ref Range Status   Specimen Description FLUID ABDOMEN PERITONEAL  Final   Special Requests NONE  Final   Culture NO GROWTH 5 DAYS  Final   Report Status 02/20/2016 FINAL  Final  Gram stain     Status: None   Collection Time: 02/15/16  1:41 PM  Result Value Ref Range Status   Specimen Description FLUID ABDOMEN PERITONEAL  Final   Special Requests NONE  Final   Gram Stain   Final    RARE WBC PRESENT, PREDOMINANTLY PMN NO ORGANISMS SEEN    Report Status 02/15/2016 FINAL  Final  MRSA PCR Screening     Status: None   Collection Time: 02/16/16  7:25 AM  Result Value Ref Range Status   MRSA by PCR NEGATIVE NEGATIVE Final    Comment:        The GeneXpert MRSA Assay (FDA approved for NASAL specimens only), is one component of a comprehensive MRSA colonization surveillance program. It is not intended to diagnose MRSA infection nor to guide or monitor treatment for MRSA infections.          Radiology Studies: No results found.      Scheduled Meds: . amLODipine  10 mg Oral Daily  . atorvastatin   40 mg Oral q1800  . cefTRIAXone (ROCEPHIN)  IV  1 g Intravenous Q24H  . cloNIDine  0.3 mg Oral TID  . clopidogrel  75 mg Oral QHS  . docusate sodium  100 mg Oral BID  . doxercalciferol  6 mcg Intravenous Q M,W,F-HD  . heparin  5,000 Units Subcutaneous Q8H  . hydrALAZINE  75 mg Oral Q8H  . insulin aspart  0-9 Units Subcutaneous TID WC  . insulin aspart protamine- aspart  23 Units Subcutaneous Q supper  . [START ON 02/21/2016] insulin aspart protamine- aspart  25 Units Subcutaneous Q breakfast  . isosorbide mononitrate  120  mg Oral Daily  . labetalol  300 mg Oral TID  . sevelamer carbonate  3,200 mg Oral TID WC   Continuous Infusions:    LOS: 5 days    [facility], MD [facility] Pager [phone]  If 7PM-7AM, please contact night-coverage [email or web address] Password [facility] 02/20/2016, 3:42 PM

## 2016-02-20 NOTE — Progress Notes (Addendum)
[facility] Progress Note   Dialysis Orders: MWF NW  4h 25min   95.5kg  2/2 bath  P2   Hep 3000  AVF Hect 6 ug Mircera 225 ug q 2wk last 9/20 Auryxia 2 tab ac and renvela 6 ac Sensipar 60/d - not taking Ca 10, P 7.2 Hb 9.3   Assessment/Plan: 1. New onset ascites - seen by cardiology and CHF team.  W/U in progress, ECHO showing severe pulm HTN, grade 3 diast dysfunction. Hx of prolonged severe HTN.  Agree with ANA and renal artery U/S which has been done and results pending.  If high clinical suspicion for cardiac amyloid could get kappa and lambda serum light chains along with SPEP to see if there are any glaring abnormalities but ratio is difficult to interpret in setting of ESRD if it is slightly abnormal. 2. HTN/ vol- 4 kg under dry wt and still BP's are up.  Has room for lowering dry wt further, will challenge again tomorrow 9/27 for next planned dialysis.   3. ^WBC in peritoneal fluid - GS and peritoneal fluid cx's negative so far, on emp Rocephin due to [facility].  Cultures negative 4. ESRD HD MWF- continuing to challenge wt 5. Anorexia/ wt loss- per primary, ? GI c/s. 6. Anemia - no need for ESA, last mircera 225 dose 9/20, won't be due to 2 weeks hgb 9.2 - consistent with last outpt Hgb which has been trending down tsat 51% in August 7. 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  Objective Vitals:   02/19/16 1340 02/19/16 1425 02/19/16 2045 02/20/16 0533  BP: (!) 180/88 (!) 185/93 (!) 153/66 (!) 152/59  Pulse: 67 68 66 69  Resp:   16 18  Temp:   97.5 F (36.4 C) 97.9 F (36.6 C)  TempSrc:   Oral Oral  SpO2:   99% 94%  Weight:      Height:       Physical  Exam General: chronically ill-appearing, laying in bed with some discomfort noted Heart: RRR, occasional atrial kick Lungs: bilateral crackles at bases, improved from prior Abdomen: + fluid wave, distended, mildly tender Extremities: trace LE edema Dialysis Access:  L AVF + bruit and thrill    Additional Objective Labs: Basic Metabolic Panel:  Recent Labs Lab 02/16/16 0832 02/18/16 0535 02/19/16 0947  NA 131* 135 132*  K 4.4 4.4 5.1  CL 92* 93* 92*  CO2 27 30 26   GLUCOSE 79 164* 143*  BUN 41* 40* 50*  CREATININE 6.17* 6.31* 7.54*  CALCIUM 9.0 9.3 9.6  PHOS 6.9* 6.8* 7.1*   Liver Function Tests:  Recent Labs Lab 02/14/16 1841 02/16/16 0832 02/19/16 0947  AST 12*  --   --   ALT 11*  --   --   ALKPHOS 236*  --   --   BILITOT 0.8  --   --   PROT 6.2*  --   --   ALBUMIN 2.8* 2.3* 2.5*    Recent Labs Lab 02/14/16 1841  LIPASE 30   CBC:  Recent Labs Lab 02/14/16 1841 02/16/16 0832 02/19/16 1002  WBC 8.1 7.0 9.7  HGB 9.7*  9.2* 9.2*  HCT 32.5* 29.6* 29.8*  MCV 93.1 90.5 91.4  PLT 222 234 320   Blood Culture    Component Value Date/Time   SDES FLUID ABDOMEN PERITONEAL 02/15/2016 1341   SDES FLUID ABDOMEN PERITONEAL 02/15/2016 1341   SPECREQUEST NONE 02/15/2016 1341   SPECREQUEST NONE 02/15/2016 1341   CULT NO GROWTH 4 DAYS 02/15/2016 1341   REPTSTATUS PENDING 02/15/2016 1341   REPTSTATUS 02/15/2016 FINAL 02/15/2016 1341    CBG:  Recent Labs Lab 02/19/16 1009 02/19/16 1421 02/19/16 1708 02/19/16 2139 02/20/16 0748  GLUCAP 152* 107* 178* 83 134*   Lab Results  Component Value Date   INR 1.24 02/15/2016   INR 1.14 02/15/2015   INR 1.09 02/09/2015   Studies/Results: No results found. Medications:   . amLODipine  10 mg Oral Daily  . atorvastatin  40 mg Oral q1800  . cefTRIAXone (ROCEPHIN)  IV  1 g Intravenous Q24H  . cloNIDine  0.3 mg Oral TID  . clopidogrel  75 mg Oral QHS  . docusate sodium  100 mg Oral BID  . doxercalciferol  6  mcg Intravenous Q M,W,F-HD  . heparin  5,000 Units Subcutaneous Q8H  . hydrALAZINE  75 mg Oral Q8H  . insulin aspart  0-9 Units Subcutaneous TID WC  . insulin aspart protamine- aspart  23 Units Subcutaneous Q supper  . [START ON 02/21/2016] insulin aspart protamine- aspart  25 Units Subcutaneous Q breakfast  . isosorbide mononitrate  120 mg Oral Daily  . labetalol  300 mg Oral TID  . sevelamer carbonate  3,200 mg Oral TID WC      Madelon Lips MD [facility] pgr [phone] cell [phone]

## 2016-02-20 NOTE — Consult Note (Signed)
Referring Provider:  Dr. Algis Liming  Primary Care Physician:  Merrilee Seashore, MD Primary Gastroenterologist:  none  Reason for Consultation:  Ascites   HPI: [name] is a 40 y.o. female with past medical history of end-stage renal disease on dialysis, diabetes, uncontrolled hypertension, chronic diastolic heart failure was admitted to the hospital for further evaluation of abdominal distention, nausea and vomiting. Initial CT abdomen on September 21 showed ascites with pericardial effusion. GI is consulted for evaluation of ascites.  Patient seen and examined. Complaining  of suprapubic and lower quadrant discomfort. Denied any fever. Complaining of nausea. Had 1 episode of nonbloody emesis yesterday. Also complaining of constipation. Denied any blood in the stool or black stool. No known liver disease. No family history of liver disease     Patient underwent paracentesis with removal of 3.4 L of fluids on 02/15/2016. Fluid analysis showed WBC of 4300 with 82% neutrophils consistent with SBP. Also showed total protein of more than 4 in the ascites fluid.    Past Medical History:  Diagnosis Date  . Chronic anemia    2nd to renal disease  . CIN III (cervical intraepithelial neoplasia grade III) with severe dysplasia    S/P LEEP AND CONE  . Coronary artery disease    Status post cardiac catheterization June 2012 scattered coronary artery disease/atherosclerosis with 70-80% stenosis in a small right PDA.  . Diabetic Charcot's joint disease ([place])   . DM (diabetes mellitus) ([place])    Long-term insulin  . End stage renal disease on dialysis ([place]) 05/02/11   [facility]; M; W, F; last time 05/01/11  . Fracture of 5th metatarsal 2016   Right  . Gastroparesis   . 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 History of abscesses in groins 12/06/2010  . Hyperlipidemia    Hypertriglyceridemia 449 HDL 25  . Hypertension   . Migraines    "just on dialysis days"  . Peripheral neuropathy (HCC)    related to  DM  . Peripheral vascular disease ([place])    Tibial occlusive disease evaluated by Dr. Kellie Simmering in August 2011. Medical therapy  . Renal insufficiency    Dialysis since 2012  . Tobacco use disorder    Discontinued March 2012  . Trimalleolar fracture of ankle, closed 02/09/2015   Right    Past Surgical History:  Procedure Laterality Date  . AV FISTULA PLACEMENT  04/2010  . CARDIAC CATHETERIZATION [name] 02/10/2015   Procedure: Left Heart Cath and Coronary Angiography;  Surgeon: Dixie Dials, MD;  Location: [facility];  Service: Cardiovascular;  Laterality: [name];  . CERVICAL BIOPSY  W/ LOOP ELECTRODE EXCISION     h/o  . CERVICAL CONE BIOPSY     h/o  . COLPOSCOPY    . DILATION AND CURETTAGE OF UTERUS  2009  . ORIF ANKLE FRACTURE Right 02/09/2015  . ORIF ANKLE FRACTURE Right 02/16/2015   Procedure: OPEN REDUCTION INTERNAL FIXATION (ORIF) RIGHT ANKLE FRACTURE;  Surgeon: Altamese Vesta, MD;  Location: [place] [facility];  Service: Orthopedics;  Laterality: Right;    Prior to Admission medications   Medication Sig Start Date End Date Taking? Authorizing Provider  atorvastatin (LIPITOR) 40 MG tablet Take 40 mg by mouth daily.   Yes Historical Provider, MD  cloNIDine (CATAPRES) 0.3 MG tablet Take 1 tablet (0.3 mg total) by mouth 3 (three) times daily. 11/01/14  Yes Debbe Odea, MD  clopidogrel (PLAVIX) 75 MG tablet Take 75 mg by mouth at bedtime.    Yes Historical Provider, MD  HUMALOG MIX 50/50 KWIKPEN (50-50) 100 UNIT/ML Kwikpen Inject 26-28 Units into the skin 2 (two) times daily. 28 in the morning and 26 in the evening. 04/29/14  Yes Historical Provider, MD  hydrALAZINE (APRESOLINE) 50 MG tablet Take 1 tablet (50 mg total) by mouth every 8 (eight) hours. 11/01/14  Yes Debbe Odea, MD  isosorbide mononitrate (IMDUR) 60 MG 24 hr tablet Take 60 mg by mouth daily.   Yes Historical Provider, MD  labetalol (NORMODYNE) 300 MG tablet Take 1 tablet (300 mg total) by mouth 3 (three) times daily. 11/01/14  Yes Debbe Odea, MD  RENVELA 800 MG tablet Take 4,800 mg by mouth 3 (three) times daily with meals.  04/17/13  Yes Historical Provider, MD  amLODipine (NORVASC) 10 MG tablet Take 1 tablet (10 mg total) by mouth daily. Patient not taking: Reported on 02/15/2016 07/05/13   Minus Breeding, MD  Ferric Citrate (AURYXIA PO) Take 2 tablets by mouth 3 (three) times daily with meals.    Historical Provider, MD    Scheduled Meds: . amLODipine  10 mg Oral Daily  . atorvastatin  40 mg Oral q1800  . cloNIDine  0.3 mg Oral TID  . clopidogrel  75 mg Oral QHS  . docusate sodium  100 mg Oral BID  . doxercalciferol  6 mcg Intravenous Q M,W,F-HD  . heparin  5,000 Units Subcutaneous Q8H  . hydrALAZINE  75 mg Oral Q8H  . insulin aspart  0-9 Units Subcutaneous TID WC  . insulin aspart protamine- aspart  23 Units Subcutaneous Q supper  . [START ON 02/21/2016] insulin aspart protamine- aspart  25 Units Subcutaneous Q breakfast  . isosorbide mononitrate  120 mg Oral Daily  . labetalol  300 mg Oral TID  . sevelamer carbonate  3,200 mg Oral TID WC   Continuous Infusions:  PRN Meds:.acetaminophen, bisacodyl, bisacodyl, ondansetron, traMADol, zolpidem  Allergies as of 02/14/2016 - Review Complete 02/14/2016  Allergen Reaction Noted  . Cephalexin Other (See Comments)   . Sulfamethoxazole-trimethoprim Other (See Comments)     Family History  Problem Relation Age of Onset  . Diabetes Mother   . Hypertension Mother   . Diabetes Father   . Hyperlipidemia Father   . Hypertension Father   . Diabetes Sister   . Stroke Maternal Grandmother   . Diabetes Sister   . Breast cancer Maternal Aunt     Age 30's    Social History   Social History  . Marital status: Married    Spouse name: [name]  . Number of children: [name]  . Years of education: [name]   Occupational History  .  [employer]   Social History Main Topics  . Smoking status: Former Smoker    Packs/day: 0.30    Years: 10.00    Types: Cigarettes    Quit date:  08/05/2011  . Smokeless tobacco: Never Used     Comment: "quit smoking cigarettes 07/2010"  . Alcohol use No  . Drug use: No  . Sexual activity: Yes    Birth control/ protection: Condom   Other Topics Concern  . Not on file   Social History Narrative  . No narrative on file    Review of Systems: All negative except as stated above in HPI.  Physical Exam: Vital signs: Vitals:   02/20/16 1324 02/20/16 1511  BP: (!) 151/54 136/60  Pulse: 67 63  Resp: 18   Temp: 98.1 F (36.7 C)    Last BM Date: 02/19/16 General:   Alert,  Well-developed, well-nourished, pleasant and cooperative in NAD Scleral icterus noted. Lungs:  Clear throughout to auscultation.   No wheezes, crackles, or rhonchi. No acute distress. Heart:  Regular rate and rhythm; no murmurs, clicks, rubs,  or gallops. Abdomen: Distended. Lower quadrant discomfort noted. Bowel sounds present. LE : No edema  Rectal:  Deferred  GI:  Lab Results:  Recent Labs  02/19/16 1002  WBC 9.7  HGB 9.2*  HCT 29.8*  PLT 320   BMET  Recent Labs  02/18/16 0535 02/19/16 0947  NA 135 132*  K 4.4 5.1  CL 93* 92*  CO2 30 26  GLUCOSE 164* 143*  BUN 40* 50*  CREATININE 6.31* 7.54*  CALCIUM 9.3 9.6   LFT  Recent Labs  02/19/16 0947  ALBUMIN 2.5*   PT/INR No results for input(s): LABPROT, INR in the last 72 hours.   Studies/Results: No results found.  Impression/Plan: - New onset of ascites in patient with the echo showing severe concentric hypertrophy, pulmonary hypertension and biventricular failure. Acetic fluid total protein is 4. - Culture negative nutrocytic ascites./SBP With PMNs of 3526. - Pericardial effusion - End-stage renal disease on dialysis - Diabetes  Recommendations ------------------------ - Most likely etiology of patient's ascites is cardiac origin,given total ascitic  fluid protein of 4. Patient also has elevated alkaline phosphatase in setting of normal AST and ALT which again points  towards hepatic congestion. - Recommend repeat paracentesis to calculate SAAG along with repeat fluid analysis  - If persistent elevated PMNs, recommend restarting antibiotics. - Patient may require lifelong secondary prophylaxis for SBP - Hepatitis panel is negative. - GI will follow.     LOS: 5 days   Otis Brace  MD, FACP 02/20/2016, 4:11 PM  Pager [phone] If no answer or after 5 PM call [phone]

## 2016-02-20 NOTE — Progress Notes (Signed)
Hypoglycemic Event  CBG: 66  Treatment: 15 GM carbohydrate snack  Symptoms: None  Follow-up CBG: Time:2330 CBG Result:86  Possible Reasons for Event: Unknown  Comments/MD notified:Kirby, NP    Melina Copa, Peri [place]

## 2016-02-20 NOTE — Progress Notes (Signed)
Preliminary results by tech - Renal Duplex Completed. No evidence of significant stenosis noted in the main renal artery bilaterally.  Oda Cogan, BS, RDMS, RVT

## 2016-02-21 ENCOUNTER — Inpatient Hospital Stay (HOSPITAL_COMMUNITY): Payer: Medicare Other

## 2016-02-21 ENCOUNTER — Encounter (HOSPITAL_COMMUNITY)
Admission: EM | Disposition: A | Discharge status: Home / Self Care | Payer: Self-pay | Source: Home / Self Care | Attending: Internal Medicine

## 2016-02-21 DIAGNOSIS — I272 Other secondary pulmonary hypertension: Secondary | ICD-10-CM

## 2016-02-21 DIAGNOSIS — I319 Disease of pericardium, unspecified: Secondary | ICD-10-CM

## 2016-02-21 DIAGNOSIS — I3139 Other pericardial effusion (noninflammatory): Secondary | ICD-10-CM

## 2016-02-21 DIAGNOSIS — I313 Pericardial effusion (noninflammatory): Secondary | ICD-10-CM

## 2016-02-21 DIAGNOSIS — E8779 Other fluid overload: Secondary | ICD-10-CM

## 2016-02-21 DIAGNOSIS — E8770 Fluid overload, unspecified: Secondary | ICD-10-CM

## 2016-02-21 HISTORY — PX: CARDIAC CATHETERIZATION: SHX172

## 2016-02-21 LAB — POCT I-STAT 3, ART BLOOD GAS (G3+)
ACID-BASE EXCESS: 8 mmol/L — AB (ref 0.0–2.0)
BICARBONATE: 32.5 mmol/L — AB (ref 20.0–28.0)
O2 Saturation: 98 %
PO2 ART: 103 mmHg (ref 83.0–108.0)
TCO2: 34 mmol/L (ref 0–100)
pCO2 arterial: 44.7 mmHg (ref 32.0–48.0)
pH, Arterial: 7.47 — ABNORMAL HIGH (ref 7.350–7.450)

## 2016-02-21 LAB — RENAL FUNCTION PANEL
ALBUMIN: 2.5 g/dL — AB (ref 3.5–5.0)
Anion gap: 18 — ABNORMAL HIGH (ref 5–15)
BUN: 41 mg/dL — AB (ref 6–20)
CALCIUM: 9.7 mg/dL (ref 8.9–10.3)
CO2: 24 mmol/L (ref 22–32)
CREATININE: 7.27 mg/dL — AB (ref 0.44–1.00)
Chloride: 93 mmol/L — ABNORMAL LOW (ref 101–111)
GFR calc Af Amer: 7 mL/min — ABNORMAL LOW (ref >60)
GFR, EST NON AFRICAN AMERICAN: 6 mL/min — AB (ref >60)
Glucose, Bld: 87 mg/dL (ref 65–99)
PHOSPHORUS: 7.5 mg/dL — AB (ref 2.5–4.6)
POTASSIUM: 5.4 mmol/L — AB (ref 3.5–5.1)
SODIUM: 135 mmol/L (ref 135–145)

## 2016-02-21 LAB — POCT I-STAT 3, VENOUS BLOOD GAS (G3P V)
ACID-BASE EXCESS: 8 mmol/L — AB (ref 0.0–2.0)
ACID-BASE EXCESS: 7 mmol/L — AB (ref 0.0–2.0)
ACID-BASE EXCESS: 8 mmol/L — AB (ref 0.0–2.0)
Acid-Base Excess: 5 mmol/L — ABNORMAL HIGH (ref 0.0–2.0)
Acid-Base Excess: 7 mmol/L — ABNORMAL HIGH (ref 0.0–2.0)
Acid-Base Excess: 6 mmol/L — ABNORMAL HIGH (ref 0.0–2.0)
Acid-Base Excess: 8 mmol/L — ABNORMAL HIGH (ref 0.0–2.0)
BICARBONATE: 29.9 mmol/L — AB (ref 20.0–28.0)
BICARBONATE: 33.3 mmol/L — AB (ref 20.0–28.0)
BICARBONATE: 31.5 mmol/L — AB (ref 20.0–28.0)
BICARBONATE: 32.8 mmol/L — AB (ref 20.0–28.0)
BICARBONATE: 32.9 mmol/L — AB (ref 20.0–28.0)
Bicarbonate: 32.4 mmol/L — ABNORMAL HIGH (ref 20.0–28.0)
Bicarbonate: 31.5 mmol/L — ABNORMAL HIGH (ref 20.0–28.0)
O2 SAT: 61 %
O2 SAT: 63 %
O2 SAT: 64 %
O2 SAT: 67 %
O2 SAT: 72 %
O2 Saturation: 72 %
O2 Saturation: 63 %
PCO2 VEN: 43.7 mmHg — AB (ref 44.0–60.0)
PCO2 VEN: 46.6 mmHg (ref 44.0–60.0)
PCO2 VEN: 50.3 mmHg (ref 44.0–60.0)
PCO2 VEN: 45.4 mmHg (ref 44.0–60.0)
PCO2 VEN: 47.1 mmHg (ref 44.0–60.0)
PH VEN: 7.429 (ref 7.250–7.430)
PH VEN: 7.447 — AB (ref 7.250–7.430)
PO2 VEN: 31 mmHg — AB (ref 32.0–45.0)
PO2 VEN: 32 mmHg (ref 32.0–45.0)
PO2 VEN: 38 mmHg (ref 32.0–45.0)
PO2 VEN: 32 mmHg (ref 32.0–45.0)
PO2 VEN: 32 mmHg (ref 32.0–45.0)
PO2 VEN: 34 mmHg (ref 32.0–45.0)
TCO2: 31 mmol/L (ref 0–100)
TCO2: 34 mmol/L (ref 0–100)
TCO2: 35 mmol/L (ref 0–100)
TCO2: 33 mmol/L (ref 0–100)
TCO2: 33 mmol/L (ref 0–100)
TCO2: 34 mmol/L (ref 0–100)
TCO2: 34 mmol/L (ref 0–100)
pCO2, Ven: 47.5 mmHg (ref 44.0–60.0)
pCO2, Ven: 49.9 mmHg (ref 44.0–60.0)
pH, Ven: 7.444 — ABNORMAL HIGH (ref 7.250–7.430)
pH, Ven: 7.45 — ABNORMAL HIGH (ref 7.250–7.430)
pH, Ven: 7.428 (ref 7.250–7.430)
pH, Ven: 7.433 — ABNORMAL HIGH (ref 7.250–7.430)
pH, Ven: 7.449 — ABNORMAL HIGH (ref 7.250–7.430)
pO2, Ven: 37 mmHg (ref 32.0–45.0)

## 2016-02-21 LAB — GLUCOSE, CAPILLARY
GLUCOSE-CAPILLARY: 93 mg/dL (ref 65–99)
GLUCOSE-CAPILLARY: 167 mg/dL — AB (ref 65–99)
Glucose-Capillary: 193 mg/dL — ABNORMAL HIGH (ref 65–99)
Glucose-Capillary: 212 mg/dL — ABNORMAL HIGH (ref 65–99)

## 2016-02-21 LAB — ALBUMIN, FLUID (OTHER): ALBUMIN FL: 1.9 g/dL

## 2016-02-21 LAB — BODY FLUID CELL COUNT WITH DIFFERENTIAL
EOS FL: 0 %
LYMPHS FL: 10 %
MONOCYTE-MACROPHAGE-SEROUS FLUID: 68 % (ref 50–90)
Neutrophil Count, Fluid: 22 % (ref 0–25)
WBC FLUID: 1545 uL — AB (ref 0–1000)

## 2016-02-21 LAB — CBC
HEMATOCRIT: 31.2 % — AB (ref 36.0–46.0)
HEMATOCRIT: 30.3 % — AB (ref 36.0–46.0)
HEMOGLOBIN: 9.2 g/dL — AB (ref 12.0–15.0)
HEMOGLOBIN: 9.1 g/dL — AB (ref 12.0–15.0)
MCH: 27.5 pg (ref 26.0–34.0)
MCH: 28.5 pg (ref 26.0–34.0)
MCHC: 29.5 g/dL — ABNORMAL LOW (ref 30.0–36.0)
MCHC: 30 g/dL (ref 30.0–36.0)
MCV: 93.4 fL (ref 78.0–100.0)
MCV: 95 fL (ref 78.0–100.0)
Platelets: 332 10*3/uL (ref 150–400)
Platelets: 313 10*3/uL (ref 150–400)
RBC: 3.34 MIL/uL — ABNORMAL LOW (ref 3.87–5.11)
RBC: 3.19 MIL/uL — ABNORMAL LOW (ref 3.87–5.11)
RDW: 17.8 % — AB (ref 11.5–15.5)
RDW: 18 % — AB (ref 11.5–15.5)
WBC: 8.3 10*3/uL (ref 4.0–10.5)
WBC: 6.7 10*3/uL (ref 4.0–10.5)

## 2016-02-21 LAB — ANTINUCLEAR ANTIBODIES, IFA: ANTINUCLEAR ANTIBODIES, IFA: NEGATIVE

## 2016-02-21 LAB — PROTEIN ELECTROPHORESIS, SERUM
A/G Ratio: 1 (ref 0.7–1.7)
ALBUMIN ELP: 2.9 g/dL (ref 2.9–4.4)
ALPHA-1-GLOBULIN: 0.4 g/dL (ref 0.0–0.4)
ALPHA-2-GLOBULIN: 1 g/dL (ref 0.4–1.0)
Beta Globulin: 0.8 g/dL (ref 0.7–1.3)
GAMMA GLOBULIN: 0.7 g/dL (ref 0.4–1.8)
Globulin, Total: 3 g/dL (ref 2.2–3.9)
Total Protein ELP: 5.9 g/dL — ABNORMAL LOW (ref 6.0–8.5)

## 2016-02-21 LAB — PROTEIN, BODY FLUID: TOTAL PROTEIN, FLUID: 4 g/dL

## 2016-02-21 LAB — GRAM STAIN

## 2016-02-21 LAB — COMPREHENSIVE METABOLIC PANEL
ALT: 10 U/L — ABNORMAL LOW (ref 14–54)
AST: 12 U/L — ABNORMAL LOW (ref 15–41)
Albumin: 2.6 g/dL — ABNORMAL LOW (ref 3.5–5.0)
Alkaline Phosphatase: 173 U/L — ABNORMAL HIGH (ref 38–126)
Anion gap: 12 (ref 5–15)
BILIRUBIN TOTAL: 0.5 mg/dL (ref 0.3–1.2)
BUN: 41 mg/dL — ABNORMAL HIGH (ref 6–20)
CHLORIDE: 93 mmol/L — AB (ref 101–111)
CO2: 30 mmol/L (ref 22–32)
CREATININE: 7.23 mg/dL — AB (ref 0.44–1.00)
Calcium: 9.8 mg/dL (ref 8.9–10.3)
GFR, EST AFRICAN AMERICAN: 7 mL/min — AB (ref >60)
GFR, EST NON AFRICAN AMERICAN: 6 mL/min — AB (ref >60)
Glucose, Bld: 118 mg/dL — ABNORMAL HIGH (ref 65–99)
POTASSIUM: 5.2 mmol/L — AB (ref 3.5–5.1)
Sodium: 135 mmol/L (ref 135–145)
TOTAL PROTEIN: 6.1 g/dL — AB (ref 6.5–8.1)

## 2016-02-21 LAB — CREATININE, SERUM
Creatinine, Ser: 4.96 mg/dL — ABNORMAL HIGH (ref 0.44–1.00)
GFR calc Af Amer: 12 mL/min — ABNORMAL LOW (ref >60)
GFR calc non Af Amer: 10 mL/min — ABNORMAL LOW (ref >60)

## 2016-02-21 SURGERY — RIGHT HEART CATH
Anesthesia: LOCAL

## 2016-02-21 MED ORDER — HEPARIN SODIUM (PORCINE) 5000 UNIT/ML IJ SOLN
5000.0000 [IU] | Freq: Three times a day (TID) | INTRAMUSCULAR | Status: DC
  Administered 2016-02-23: 5000 [IU] via SUBCUTANEOUS
  Filled 2016-02-21 (×4): qty 1

## 2016-02-21 MED ORDER — LIDOCAINE HCL (PF) 1 % IJ SOLN
INTRAMUSCULAR | Status: AC
  Filled 2016-02-21: qty 30

## 2016-02-21 MED ORDER — FENTANYL CITRATE (PF) 100 MCG/2ML IJ SOLN
50.0000 ug | Freq: Once | INTRAMUSCULAR | Status: AC
  Administered 2016-02-21: 50 ug via INTRAVENOUS

## 2016-02-21 MED ORDER — HEPARIN (PORCINE) IN NACL 2-0.9 UNIT/ML-% IJ SOLN
INTRAMUSCULAR | Status: AC
  Filled 2016-02-21: qty 500

## 2016-02-21 MED ORDER — DOXERCALCIFEROL 4 MCG/2ML IV SOLN
INTRAVENOUS | Status: AC
  Administered 2016-02-21: 6 ug via INTRAVENOUS
  Filled 2016-02-21: qty 4

## 2016-02-21 MED ORDER — SODIUM CHLORIDE 0.9 % IV SOLN: 250.0000 mL | INTRAVENOUS | Status: DC | PRN

## 2016-02-21 MED ORDER — FENTANYL CITRATE (PF) 100 MCG/2ML IJ SOLN
INTRAMUSCULAR | Status: AC
  Filled 2016-02-21: qty 2

## 2016-02-21 MED ORDER — ONDANSETRON HCL 4 MG/2ML IJ SOLN
4.0000 mg | Freq: Four times a day (QID) | INTRAMUSCULAR | Status: DC | PRN
Start: 2016-02-21 — End: 2016-02-24
  Administered 2016-02-23 – 2016-02-24 (×3): 4 mg via INTRAVENOUS
  Filled 2016-02-21: qty 2

## 2016-02-21 MED ORDER — DOXERCALCIFEROL 4 MCG/2ML IV SOLN
4.0000 ug | INTRAVENOUS | Status: DC
  Administered 2016-02-23: 4 ug via INTRAVENOUS
  Filled 2016-02-21: qty 2

## 2016-02-21 MED ORDER — SODIUM CHLORIDE 0.9% FLUSH: 3.0000 mL | INTRAVENOUS | Status: DC | PRN

## 2016-02-21 MED ORDER — MIDAZOLAM HCL 2 MG/2ML IJ SOLN
INTRAMUSCULAR | Status: DC | PRN
  Administered 2016-02-21: 1 mg via INTRAVENOUS

## 2016-02-21 MED ORDER — MIDAZOLAM HCL 2 MG/2ML IJ SOLN
INTRAMUSCULAR | Status: AC
  Filled 2016-02-21: qty 2

## 2016-02-21 MED ORDER — LIDOCAINE HCL (PF) 1 % IJ SOLN
INTRAMUSCULAR | Status: DC | PRN
  Administered 2016-02-21: 20 mL

## 2016-02-21 MED ORDER — ACETAMINOPHEN 325 MG PO TABS
650.0000 mg | ORAL_TABLET | ORAL | Status: DC | PRN
  Administered 2016-02-22: 650 mg via ORAL
  Filled 2016-02-21: qty 2

## 2016-02-21 MED ORDER — ONDANSETRON HCL 4 MG/2ML IJ SOLN
INTRAMUSCULAR | Status: AC
  Filled 2016-02-21: qty 2

## 2016-02-21 MED ORDER — OXYCODONE HCL 5 MG PO TABS
5.0000 mg | ORAL_TABLET | Freq: Once | ORAL | Status: AC
  Administered 2016-02-21: 5 mg via ORAL
  Filled 2016-02-21: qty 1

## 2016-02-21 MED ORDER — FENTANYL CITRATE (PF) 100 MCG/2ML IJ SOLN
INTRAMUSCULAR | Status: DC | PRN
  Administered 2016-02-21: 25 ug via INTRAVENOUS

## 2016-02-21 MED ORDER — SODIUM CHLORIDE 0.9% FLUSH
3.0000 mL | Freq: Two times a day (BID) | INTRAVENOUS | Status: DC
  Administered 2016-02-22 – 2016-02-23 (×3): 3 mL via INTRAVENOUS

## 2016-02-21 MED ORDER — LIDOCAINE HCL 1 % IJ SOLN
INTRAMUSCULAR | Status: AC
  Filled 2016-02-21: qty 20

## 2016-02-21 SURGICAL SUPPLY — 8 items
CATH SWAN GANZ 7F STRAIGHT (CATHETERS) ×1 IMPLANT
PACK CARDIAC CATHETERIZATION (CUSTOM PROCEDURE TRAY) ×1 IMPLANT
SHEATH FAST CATH BRACH 5F 5CM (SHEATH) IMPLANT
SHEATH PINNACLE 7F 10CM (SHEATH) ×1 IMPLANT
TRANSDUCER W/STOPCOCK (MISCELLANEOUS) ×1 IMPLANT
TUBING ART PRESS 72  MALE/FEM (TUBING) ×1
TUBING ART PRESS 72 MALE/FEM (TUBING) IMPLANT
WIRE EMERALD 3MM-J .025X260CM (WIRE) ×1 IMPLANT

## 2016-02-21 NOTE — Progress Notes (Signed)
[facility] Progress Note  [name] 40 y.o. 1975/07/27  CC:  Ascites   Subjective: Patient continues to have lower quadrant discomfort. Denied nausea or vomiting. Just returned from her dialysis  ROS : Complaining of lower quadrant discomfort. Denied nausea or vomiting.   Objective: Vital signs in last 24 hours: Vitals:   02/21/16 1143 02/21/16 1145  BP: (!) 186/77 (!) 184/81  Pulse: 69 70  Resp: 17   Temp: 97.3 F (36.3 C)     Physical Exam:  General:   Alert,  Well-developed, well-nourished, pleasant and cooperative in NAD Scleral icterus noted. Lungs:  Clear throughout to auscultation.   No wheezes, crackles, or rhonchi. No acute distress. Heart:  Regular rate and rhythm; no murmurs, clicks, rubs,  or gallops. Abdomen: Distended. Lower quadrant discomfort noted. Bowel sounds present. LE : No edema  Lab Results:  Recent Labs  02/19/16 0947 02/21/16 0454 02/21/16 0811  NA 132* 135 135  K 5.1 5.2* 5.4*  CL 92* 93* 93*  CO2 26 30 24   GLUCOSE 143* 118* 87  BUN 50* 41* 41*  CREATININE 7.54* 7.23* 7.27*  CALCIUM 9.6 9.8 9.7  PHOS 7.1*  --  7.5*    Recent Labs  02/21/16 0454 02/21/16 0811  AST 12*  --   ALT 10*  --   ALKPHOS 173*  --   BILITOT 0.5  --   PROT 6.1*  --   ALBUMIN 2.6* 2.5*    Recent Labs  02/19/16 1002 02/21/16 0810  WBC 9.7 8.3  HGB 9.2* 9.2*  HCT 29.8* 31.2*  MCV 91.4 93.4  PLT 320 332   No results for input(s): LABPROT, INR in the last 72 hours.    Assessment/Plan: - New onset of ascites in patient with the echo showing severe concentric hypertrophy, pulmonary hypertension and biventricular failure. Acetic fluid total protein is 4. - Culture negative nutrocytic ascites./SBP With PMNs of 3526. - Pericardial effusion - End-stage renal disease on dialysis - Diabetes  Recommendations ------------------------ - Awaiting repeat paracentesis for  fluid analysis - Most likely etiology of patient's ascites is  cardiac origin,given total ascitic fluid protein of 4. Patient also has elevated alkaline phosphatase in setting of normal AST and ALT which again points towards hepatic congestion. - If persistent elevated PMNs or abdominal pain , recommend restarting antibiotics. - Patient may require lifelong secondary prophylaxis for SBP - Hepatitis panel is negative. - GI will follow.    Otis Brace MD, [place] 02/21/2016, 1:30 PM  Pager [phone]  If no answer or after 5 PM call [phone]

## 2016-02-21 NOTE — Interval H&P Note (Signed)
History and Physical Interval Note:  02/21/2016 5:08 PM  Aloura Matsuoka DESJARDIN  has presented today for surgery, with the diagnosis of sob, chf  The various methods of treatment have been discussed with the patient and family. After consideration of risks, benefits and other options for treatment, the patient has consented to  Procedure(s): Right Heart Cath ([name]) as a surgical intervention .  The patient's history has been reviewed, patient examined, no change in status, stable for surgery.  I have reviewed the patient's chart and labs.  Questions were answered to the patient's satisfaction.     Nataliyah Packham, Quillian Quince

## 2016-02-21 NOTE — Progress Notes (Signed)
PROGRESS NOTE  [name] [id number] DOB: 09-Feb-1976 DOA: 02/14/2016 PCP: Merrilee Seashore, MD  Brief Narrative:  40 year old female with PMH of ESRD on MWF HD, CIN III s/p LEEP, CAD status post cardiac 2012, type II DM/IDDM, HLD, HTN, PAD,? Chronic CHF (follows with Dr. Einar Gip) presented to [facility] ED on 02/14/20 with gradually worsening abdominal distention, fluctuating appetite, approximately 20 pound weight loss since April 2017, no reported change in bowel habits, compliant with dialysis. In ED noted to have clinical ascites and CT abdomen with contrast revealed small to moderate ascites and pericardial effusion.Admitted for further evaluation. Underwent diagnostic and therapeutic paracentesis which showed significant neutrophilic leukocytosis concerning for SBP and hence treated with IV Rocephin but final culture negative-DC IV Rocephin. Cardiology/advanced heart failure team evaluated and suspect patient's ascites is from right-sided heart failure related to poorly controlled/resistant hypertension and restricted cardiomyopathy. Nephrology trying to reduce volume across dialysis. Slowly reaccumulating ascitic fluid and abdominal pain.    Assessment & Plan:   Ascites - likely decompensated R-sided CHF - CT abdomen report as below was appreciated. Liver appears unremarkable & INR/1.24. - Status post diagnostic and therapeutic paracentesis by IR 9/21: Ascitic fluid WBCs 4300, 82% neutrophils and 9% monocytes/macrophages, Gram stain negative, culture negative. Concern for SBP>Started IV ceftriaxone and now that culture is negative, DC on 9/26. - Cardiology input appreciated: Suspect biventricular failure with acute on chronic diastolic heart failure, pulmonary hypertension with RV failure - Advanced heart failure team input appreciated: Control severe HTN suspect severe LVH related to hypertensive cardiomyopathy and not amyloid but will await echo/not candidate for C MRI, RHF,  ascites and albumin suggestive of cardiac cirrhosis but liver okay on CT. - Nephrology follow-up appreciated.  -volume management across dialysis. - States worsening abdominal distention and may be reaccumulating.  -02/21/2016 and repeat paracentesis--1.1 L removed--calculate SAAG, follow cell count and culture - Ascitic fluid cytology: No malignant cells, reactive mesothelial cells and abundant acute inflammation present. Cultures negative. - ANA-NEG. Acute hepatitis panel: Negative. - ? Abdominal pain d/t bowel edema from fluid overload. - Appreciate [facility]   Pericardial effusion - Management as outlined in ascites. Repeat echo. No clinical features of cardiac tamponade. - As per cardiology, moderate size without hemodynamic compromise.  Pulmonary hypertension/acute on chronic right heart failure -02/17/2016 echo EF 55-60%, grade 3DD, PAP 62, moderate RVH - 02/21/2016--right heart catheterization planned  ESRD on MWF HD - Nephrology consultation and follow-up appreciated.  Uncontrolled type II DM/IDDM -Check A1c: 11.2 suggesting very poor outpatient control. - CBGs reasonably controlled over the last couple days.? Issues with compliance with all medications at home given good control of her diabetes in the hospital with stated home regimen. - Due to very tight control of CBGs, reduced insulin's on 9/26  Essential hypertension - Uncontrolled. Continue with amlodipine, clonidine, hydralazine, labetalol and Imdur. When necessary IV hydralazine. - Polypharmacy suggests refractory hypertension. As per discussion with her cardiologist, issues with compliance and persistently markedly elevated blood pressures. Attempt volume removal across dialysis and reassess blood pressure with her home regimen. May consider adding doxazosin. - As discussed with primary cardiologist, check renal artery Doppler--neg for RAS  Hyperlipidemia - Continue statins.  CAD - Continue Plavix, statins.  Asymptomatic of chest pain.  Weight loss - Unclear etiology.? Related to ascites.  Anemia of CKD/Chronic disease - Hgb stable  Constipation - Despite home dose of Dulcolax 20 mg and Dulcolax suppository, no BM. Had BM after an enema  on 9/25.  DVT prophylaxis: Heparin Code Status: Full Family Communication: None at bedside Disposition Plan: DC home when medically improved and pending evaluation.   Consultants:   Nephrology  IR  Cardiology  Advanced heart failure team.  Procedures:   HD  3.4 L paracentesis by IR on 9/21.  1.1 L paracentesis on 9/27  Antimicrobials:   None    Subjective: Patient complains of lower abdominal pain. Denies any fevers, chills, chest pain or shortness of breath, nausea, vomiting, diarrhea. Denies any medication or melena. Denies any headache or neck pain.  Objective: Vitals:   02/21/16 1734 02/21/16 1739 02/21/16 1744 02/21/16 1749  BP: (!) 189/89 (!) 186/88 (!) 194/87 (!) 186/87  Pulse: 71 71 69 72  Resp: 15 14 11  (!) 9  Temp:      TempSrc:      SpO2: 100% 100% 100% 99%  Weight:      Height:        Intake/Output Summary (Last 24 hours) at 02/21/16 1820 Last data filed at 02/21/16 1145  Gross per 24 hour  Intake                0 ml  Output             3500 ml  Net            -3500 ml   Weight change:  Exam:   General:  Pt is alert, follows commands appropriately, not in acute distress  HEENT: No icterus, No thrush, No neck mass, [place]/AT  Cardiovascular: RRR, S1/S2, no rubs, no gallops  Respiratory: Fine bibasilar crackles. No wheeze. Good air movement.  Abdomen: Soft/+BS, LLQ/RLQ  Tender without rebound, non distended, no guarding  Extremities: trace LE edema, No lymphangitis, No petechiae, No rashes, no synovitis   Data Reviewed: I have personally reviewed following labs and imaging studies Basic Metabolic Panel:  Recent Labs Lab 02/16/16 0832 02/18/16 0535 02/19/16 0947 02/21/16 0454  02/21/16 0811  NA 131* 135 132* 135 135  K 4.4 4.4 5.1 5.2* 5.4*  CL 92* 93* 92* 93* 93*  CO2 27 30 26 30 24   GLUCOSE 79 164* 143* 118* 87  BUN 41* 40* 50* 41* 41*  CREATININE 6.17* 6.31* 7.54* 7.23* 7.27*  CALCIUM 9.0 9.3 9.6 9.8 9.7  PHOS 6.9* 6.8* 7.1*  --  7.5*   Liver Function Tests:  Recent Labs Lab 02/14/16 1841 02/16/16 0832 02/19/16 0947 02/21/16 0454 02/21/16 0811  AST 12*  --   --  12*  --   ALT 11*  --   --  10*  --   ALKPHOS 236*  --   --  173*  --   BILITOT 0.8  --   --  0.5  --   PROT 6.2*  --   --  6.1*  --   ALBUMIN 2.8* 2.3* 2.5* 2.6* 2.5*    Recent Labs Lab 02/14/16 1841  LIPASE 30   No results for input(s): AMMONIA in the last 168 hours. Coagulation Profile:  Recent Labs Lab 02/15/16 1418  INR 1.24   CBC:  Recent Labs Lab 02/14/16 1841 02/16/16 0832 02/19/16 1002 02/21/16 0810  WBC 8.1 7.0 9.7 8.3  HGB 9.7* 9.2* 9.2* 9.2*  HCT 32.5* 29.6* 29.8* 31.2*  MCV 93.1 90.5 91.4 93.4  PLT 222 234 320 332   Cardiac Enzymes: No results for input(s): CKTOTAL, CKMB, CKMBINDEX, TROPONINI in the last 168 hours. BNP: Invalid input(s): POCBNP CBG:  Recent Labs Lab  02/20/16 1749 02/20/16 2222 02/20/16 2330 02/21/16 0720 02/21/16 1812  GLUCAP 116* 66 86 93 193*   HbA1C: No results for input(s): HGBA1C in the last 72 hours. Urine analysis:    Component Value Date/Time   COLORURINE YELLOW 02/08/2011 1812   APPEARANCEUR CLEAR 02/08/2011 1812   LABSPEC 1.016 02/08/2011 1812   PHURINE 8.0 02/08/2011 1812   GLUCOSEU 250 (A) 02/08/2011 1812   HGBUR NEGATIVE 02/08/2011 1812   BILIRUBINUR NEGATIVE 02/08/2011 1812   KETONESUR NEGATIVE 02/08/2011 1812   PROTEINUR >300 (A) 02/08/2011 1812   UROBILINOGEN 0.2 02/08/2011 1812   NITRITE NEGATIVE 02/08/2011 1812   LEUKOCYTESUR NEGATIVE 02/08/2011 1812   Sepsis Labs: @LABRCNTIP (procalcitonin:4,lacticidven:4) ) Recent Results (from the past 240 hour(s))  Culture, body fluid-bottle     Status:  None   Collection Time: 02/15/16  1:41 PM  Result Value Ref Range Status   Specimen Description FLUID ABDOMEN PERITONEAL  Final   Special Requests NONE  Final   Culture NO GROWTH 5 DAYS  Final   Report Status 02/20/2016 FINAL  Final  Gram stain     Status: None   Collection Time: 02/15/16  1:41 PM  Result Value Ref Range Status   Specimen Description FLUID ABDOMEN PERITONEAL  Final   Special Requests NONE  Final   Gram Stain   Final    RARE WBC PRESENT, PREDOMINANTLY PMN NO ORGANISMS SEEN    Report Status 02/15/2016 FINAL  Final  MRSA PCR Screening     Status: None   Collection Time: 02/16/16  7:25 AM  Result Value Ref Range Status   MRSA by PCR NEGATIVE NEGATIVE Final    Comment:        The GeneXpert MRSA Assay (FDA approved for NASAL specimens only), is one component of a comprehensive MRSA colonization surveillance program. It is not intended to diagnose MRSA infection nor to guide or monitor treatment for MRSA infections.   Gram stain     Status: None   Collection Time: 02/21/16  4:27 PM  Result Value Ref Range Status   Specimen Description FLUID PERITONEAL  Final   Special Requests NONE  Final   Gram Stain   Final    RARE WBC PRESENT, PREDOMINANTLY PMN NO ORGANISMS SEEN    Report Status 02/21/2016 FINAL  Final     Scheduled Meds: . [MAR Hold] amLODipine  10 mg Oral Daily  . [MAR Hold] atorvastatin  40 mg Oral q1800  . [MAR Hold] cloNIDine  0.3 mg Oral TID  . [MAR Hold] clopidogrel  75 mg Oral QHS  . [MAR Hold] docusate sodium  100 mg Oral BID  . [MAR Hold] doxercalciferol  4 mcg Intravenous Q M,W,F-HD  . [MAR Hold] heparin  5,000 Units Subcutaneous Q8H  . [MAR Hold] hydrALAZINE  75 mg Oral Q8H  . [MAR Hold] insulin aspart  0-9 Units Subcutaneous TID WC  . [MAR Hold] insulin aspart protamine- aspart  23 Units Subcutaneous Q supper  . [MAR Hold] insulin aspart protamine- aspart  25 Units Subcutaneous Q breakfast  . [MAR Hold] isosorbide mononitrate  120 mg  Oral Daily  . [MAR Hold] labetalol  300 mg Oral TID  . lidocaine      . ondansetron      . [MAR Hold] sevelamer carbonate  3,200 mg Oral TID WC   Continuous Infusions:   Procedures/Studies: Dg Chest 2 View  Result Date: 02/15/2016 CLINICAL DATA:  Patient reports fluid in the abdomen. History of CAD, hypertension, diabetes. History  of dialysis. EXAM: CHEST  2 VIEW COMPARISON:  10/25/2015 FINDINGS: The heart is enlarged and stable in configuration. There are no focal consolidations or pleural effusions. No pulmonary edema. IMPRESSION: Stable cardiomegaly. Electronically Signed   By: Nolon Nations M.D.   On: 02/15/2016 13:14   Ct Abdomen Pelvis W Contrast  Result Date: 02/15/2016 CLINICAL DATA:  Acute onset of generalized abdominal distention, nausea and vomiting. Abdominal pain. Initial encounter. EXAM: CT ABDOMEN AND PELVIS WITH CONTRAST TECHNIQUE: Multidetector CT imaging of the abdomen and pelvis was performed using the standard protocol following bolus administration of intravenous contrast. CONTRAST:  100 mL ISOVUE-300 IOPAMIDOL (ISOVUE-300) INJECTION 61% COMPARISON:  MRI of the lumbar spine performed 11/02/2015, and CT of the abdomen and pelvis from 05/24/2014 FINDINGS: Lower chest: A small to moderate pericardial effusion is noted. Diffuse coronary artery calcifications are seen. The visualized lung bases appear grossly clear. Hepatobiliary: The liver is grossly unremarkable in appearance. The gallbladder is grossly unremarkable, though difficult to fully assess given adjacent ascites. The common bile duct remains normal in caliber. Pancreas: The pancreas is within normal limits. Spleen: The spleen is unremarkable in appearance. Adrenals/Urinary Tract: The adrenal glands are unremarkable in appearance. There is moderate renal atrophy bilaterally, with scattered small bilateral renal cysts and underlying vascular calcifications at both renal hila. There is no evidence of hydronephrosis.  Stomach/Bowel: The stomach is unremarkable in appearance. The small bowel is within normal limits. The appendix is normal in caliber, without evidence of appendicitis. The colon is unremarkable in appearance. Vascular/Lymphatic: Relatively diffuse calcification is seen along the abdominal aorta and its branches. The inferior vena cava is grossly unremarkable. No pelvic sidewall lymphadenopathy is seen. There is no evidence of retroperitoneal or inguinal lymphadenopathy. Reproductive: The bladder is mildly distended and within normal limits. The uterus is grossly unremarkable in appearance. The ovaries are relatively symmetric. No suspicious adnexal masses are seen. Other: Small to moderate volume ascites is seen within the abdomen and pelvis. No additional soft tissue abnormalities are seen. Musculoskeletal: No acute osseous abnormalities are identified. The visualized musculature is unremarkable in appearance. IMPRESSION: 1. Small to moderate volume ascites noted within the abdomen and pelvis. 2. Small to moderate pericardial effusion seen. 3. Diffuse coronary artery calcifications noted. 4. Aortic atherosclerosis. 5. Moderate bilateral renal atrophy, with scattered small bilateral renal cysts. Electronically Signed   By: Garald Balding M.D.   On: 02/15/2016 02:54   US Paracentesis  Result Date: 02/21/2016 INDICATION: End-stage renal disease on hemodialysis with heart failure. Ascites. Request for diagnostic and therapeutic paracentesis. EXAM: ULTRASOUND GUIDED DIAGNOSTIC AND THERAPEUTIC PARACENTESIS MEDICATIONS: 1% lidocaine COMPLICATIONS: None immediate. PROCEDURE: Informed written consent was obtained from the patient after a discussion of the risks, benefits and alternatives to treatment. A timeout was performed prior to the initiation of the procedure. Initial ultrasound scanning demonstrates a small amount of ascites within the left lower abdominal quadrant. The left lower abdomen was prepped and draped  in the usual sterile fashion. 1% lidocaine was used for local anesthesia. Following this, a 19 gauge, 7-cm, Yueh catheter was introduced. An ultrasound image was saved for documentation purposes. The paracentesis was performed. The catheter was removed and a dressing was applied. The patient tolerated the procedure well without immediate post procedural complication. FINDINGS: A total of approximately 1.1 L of slightly cloudy yellow fluid was removed. Samples were sent to the laboratory as requested by the clinical team. IMPRESSION: Successful ultrasound-guided paracentesis yielding 1.1 liters of peritoneal fluid. Read by: Saverio Danker, PA-C  Electronically Signed   By: Sandi Mariscal M.D.   On: 02/21/2016 16:24   US Paracentesis  Result Date: 02/15/2016 INDICATION: End stage renal failure on hemodialysis. Ascites. Request for diagnostic and therapeutic paracentesis. EXAM: ULTRASOUND GUIDED RIGHT LATERAL ABDOMEN PARACENTESIS MEDICATIONS: 1% Lidocaine. COMPLICATIONS: None immediate. PROCEDURE: Informed written consent was obtained from the patient after a discussion of the risks, benefits and alternatives to treatment. A timeout was performed prior to the initiation of the procedure. Initial ultrasound scanning demonstrates a moderate amount of ascites within the right lateral abdomen. The right lateral abdomen was prepped and draped in the usual sterile fashion. 1% lidocaine with epinephrine was used for local anesthesia. Following this, a 19 gauge, 7-cm, Yueh catheter was introduced. An ultrasound image was saved for documentation purposes. The paracentesis was performed. The catheter was removed and a dressing was applied. The patient tolerated the procedure well without immediate post procedural complication. FINDINGS: A total of approximately 3.4 liters of clear light orange fluid was removed. Samples were sent to the laboratory as requested by the clinical team. IMPRESSION: Successful ultrasound-guided  paracentesis yielding 3.4 liters of peritoneal fluid. Read by:  Gareth [facility], PA-C Electronically Signed   By: Corrie Mckusick D.O.   On: 02/15/2016 13:09    Jackquline Branca, DO  [facility] Pager [phone]  If 7PM-7AM, please contact night-coverage [email or web address] Password [email or web address] 02/21/2016, 6:20 PM   LOS: 6 days

## 2016-02-21 NOTE — Progress Notes (Signed)
Report received from [place], [place].

## 2016-02-21 NOTE — Procedures (Signed)
Patient seen on Hemodialysis. QB 400, UF goal 4 L.  Continue to challenge EDW. Treatment adjusted as needed.  Madelon Lips MD [facility]. Cell: [phone] Pgr: [phone] 12:05 PM

## 2016-02-21 NOTE — Progress Notes (Signed)
Site area: rt groin fv sheath Site Prior to Removal:  Level 0 Pressure Applied For: 15 minutes Manual:   yes Patient Status During Pull:  stable Post Pull Site:  Level  0 Post Pull Instructions Given:  yes Post Pull Pulses Present: yes Dressing Applied:  Small teggaderm Bedrest begins @  [id number] Comments:

## 2016-02-21 NOTE — Procedures (Signed)
Ultrasound-guided diagnostic and therapeutic paracentesis performed yielding 1.1 liters of yellow, slightly cloudy colored fluid. No immediate complications.  Cavon Nicolls E 4:22 PM 02/21/2016

## 2016-02-21 NOTE — Progress Notes (Signed)
[facility] Progress Note   Dialysis Orders: MWF NW  4h 33min   95.5kg  2/2 bath  P2   Hep 3000  AVF Hect 6 ug Mircera 225 ug q 2wk last 9/20 Auryxia 2 tab ac and renvela 6 ac Sensipar 60/d - not taking Ca 10, P 7.2 Hb 9.3   Assessment/Plan: 1. New onset ascites - seen by cardiology and CHF team.  W/U in progress, ECHO showing severe pulm HTN, grade 3 diast dysfunction. Hx of prolonged severe HTN.  Agree with ANA and renal artery U/S which has been done and results pending.  If high clinical suspicion for cardiac amyloid could get kappa and lambda serum light chains along with SPEP to see if there are any glaring abnormalities but ratio is difficult to interpret in setting of ESRD if it is slightly abnormal.  GI consulted.   2. HTN/ vol- 4 kg under dry wt and still BP's are up.  Has room for lowering dry wt further, will challenge again in  9/29 for next planned dialysis.  Large IDWG.  Not adhering to fluid restriction. 3. ^WBC in peritoneal fluid - GS and peritoneal fluid cx's negative so far, on emp Rocephin due to [facility].  Cultures negative 4. ESRD HD MWF- continuing to challenge wt 5. Anorexia/ wt loss- per primary  6. Anemia - no need for ESA, last mircera 225 dose 9/20, won't be due to 2 weeks hgb 9.2 - consistent with last outpt Hgb which has been trending down tsat 51% in August 7. 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 Sleepy.    Objective Vitals:   02/21/16 1100 02/21/16 1130 02/21/16 1143 02/21/16 1145  BP: (!) 169/81 (!) 192/81 (!) 186/77 (!) 184/81  Pulse: 68 68 69 70  Resp:   17   Temp:   97.3 F (36.3 C)   TempSrc:      SpO2:       Weight:   91.8 kg (202 lb 6.1 oz)   Height:       Physical Exam General: chronically ill-appearing,  Heart: RRR, occasional atrial kick Lungs: bilateral crackles at bases, improved from prior Abdomen: + fluid wave, distended, mildly tender Extremities: trace LE edema Dialysis Access:  L AVF + bruit and thrill    Additional Objective Labs: Basic Metabolic Panel:  Recent Labs Lab 02/18/16 0535 02/19/16 0947 02/21/16 0454 02/21/16 0811  NA 135 132* 135 135  K 4.4 5.1 5.2* 5.4*  CL 93* 92* 93* 93*  CO2 30 26 30 24   GLUCOSE 164* 143* 118* 87  BUN 40* 50* 41* 41*  CREATININE 6.31* 7.54* 7.23* 7.27*  CALCIUM 9.3 9.6 9.8 9.7  PHOS 6.8* 7.1*  --  7.5*   Liver Function Tests:  Recent Labs Lab 02/14/16 1841  02/19/16 0947 02/21/16 0454 02/21/16 0811  AST 12*  --   --  12*  --   ALT 11*  --   --  10*  --   ALKPHOS 236*  --   --  173*  --   BILITOT 0.8  --   --  0.5  --  PROT 6.2*  --   --  6.1*  --   ALBUMIN 2.8*  < > 2.5* 2.6* 2.5*  < > = values in this interval not displayed.  Recent Labs Lab 02/14/16 1841  LIPASE 30   CBC:  Recent Labs Lab 02/14/16 1841 02/16/16 0832 02/19/16 1002 02/21/16 0810  WBC 8.1 7.0 9.7 8.3  HGB 9.7* 9.2* 9.2* 9.2*  HCT 32.5* 29.6* 29.8* 31.2*  MCV 93.1 90.5 91.4 93.4  PLT 222 234 320 332   Blood Culture    Component Value Date/Time   SDES FLUID ABDOMEN PERITONEAL 02/15/2016 1341   SDES FLUID ABDOMEN PERITONEAL 02/15/2016 1341   SPECREQUEST NONE 02/15/2016 1341   SPECREQUEST NONE 02/15/2016 1341   CULT NO GROWTH 5 DAYS 02/15/2016 1341   REPTSTATUS 02/20/2016 FINAL 02/15/2016 1341   REPTSTATUS 02/15/2016 FINAL 02/15/2016 1341    CBG:  Recent Labs Lab 02/20/16 1111 02/20/16 1749 02/20/16 2222 02/20/16 2330 02/21/16 0720  GLUCAP 169* 116* 66 86 93   Lab Results  Component Value Date   INR 1.24 02/15/2016   INR 1.14 02/15/2015   INR 1.09 02/09/2015   Studies/Results: No results found. Medications:   .  amLODipine  10 mg Oral Daily  . atorvastatin  40 mg Oral q1800  . cloNIDine  0.3 mg Oral TID  . clopidogrel  75 mg Oral QHS  . docusate sodium  100 mg Oral BID  . doxercalciferol  6 mcg Intravenous Q M,W,F-HD  . heparin  5,000 Units Subcutaneous Q8H  . hydrALAZINE  75 mg Oral Q8H  . insulin aspart  0-9 Units Subcutaneous TID WC  . insulin aspart protamine- aspart  23 Units Subcutaneous Q supper  . insulin aspart protamine- aspart  25 Units Subcutaneous Q breakfast  . isosorbide mononitrate  120 mg Oral Daily  . labetalol  300 mg Oral TID  . ondansetron      . sevelamer carbonate  3,200 mg Oral TID WC      Madelon Lips MD [facility] pgr [phone] cell [phone]

## 2016-02-21 NOTE — Progress Notes (Addendum)
Subjective:  Still complains of abdominal discomfort and recurrence of abdominal bloating. Objective:  Vital Signs in the last 24 hours: Temp:  [97.3 F (36.3 C)-98.7 F (37.1 C)] 97.3 F (36.3 C) (09/27 1143) Pulse Rate:  [63-70] 70 (09/27 1401) Resp:  [16-20] 17 (09/27 1143) BP: (136-192)/(60-81) 184/81 (09/27 1401) SpO2:  [97 %-98 %] 98 % (09/27 0740) Weight:  [91.8 kg (202 lb 6.1 oz)-95.3 kg (210 lb 1.6 oz)] 91.8 kg (202 lb 6.1 oz) (09/27 1143)  Intake/Output from previous day: 09/26 0701 - 09/27 0700 In: 68 [P.O.:220] Out: -   Physical Exam: General appearance: alert, cooperative, appears older than stated age, looks chronically ill and no distress Lungs: clear to auscultation bilaterally  Cardiac: S1 and S2 is normal,  2/6 systolic ejection murmur in the left parasternal region is heard.  No gallop. Chest wall: no tenderness Abdomen: soft, mildly tender, bowel sounds normal; no masses,  no organomegaly and mildly distended, ascites present. Extremities: extremities normal, atraumatic, no cyanosis or edema Pulses: Bilateral hair loss present, femoral pulses 2+ with a lateral bruit, popliteal pulse faint bilateral.  Pedal pulses absent bilaterally.  Capillary refill normal. Left forearm AV fistula, functioning. Neurologic: Grossly normal Lab Results: BMP  Recent Labs  02/19/16 0947 02/21/16 0454 02/21/16 0811  NA 132* 135 135  K 5.1 5.2* 5.4*  CL 92* 93* 93*  CO2 26 30 24   GLUCOSE 143* 118* 87  BUN 50* 41* 41*  CREATININE 7.54* 7.23* 7.27*  CALCIUM 9.6 9.8 9.7  GFRNONAA 6* 6* 6*  GFRAA 7* 7* 7*    CBC  Recent Labs Lab 02/21/16 0810  WBC 8.3  RBC 3.34*  HGB 9.2*  HCT 31.2*  PLT 332  MCV 93.4  MCH 27.5  MCHC 29.5*  RDW 17.8*    HEMOGLOBIN A1C Lab Results  Component Value Date   HGBA1C 11.2 (H) 02/15/2016   MPG 275 02/15/2016     Recent Labs  08/26/15 1433  TROPONINI 0.05*   Recent Labs  08/26/15 1433 02/14/16 1841  02/19/16 0947  02/21/16 0454 02/21/16 0811  PROT 6.6 6.2*  --   --  6.1*  --   ALBUMIN 3.6 2.8*  < > 2.5* 2.6* 2.5*  AST 19 12*  --   --  12*  --   ALT 15 11*  --   --  10*  --   ALKPHOS 63 236*  --   --  173*  --   BILITOT 1.0 0.8  --   --  0.5  --   < > = values in this interval not displayed.  EKG 02/15/2016: Normal sinus rhythm at a rate of 69 bpm, left axis deviation, left anterior fascicular block.  Poor R-wave progression, cannot exclude anterior infarct old.  LVH with repolarization abnormality, cannot exclude high lateral ischemia.  Compared to 0/05/2015, no significant changes evident, ST segment changes in V5 and V6 much improved.   Out-patient:  Echocardiogram 12/28/2015: 1. Left ventricle cavity is normal in size. Severe concentric hypertrophy of the left ventricle with speckled pattern suggestive of infiltrative cardiomyopathy. Normal global wall motion. Doppler evidence of grade II (pseudonormal) diastolic dysfunction. Diastolic dysfunction findings suggests elevated LA/LV end diastolic pressure. Calculated EF 64%. 2. Severe bi-atrial enlargement, LA measures 6.3 cm. 3. Right ventricle cavity is normal in size. Moderate concentric hypertrophy of the right ventricle. Speckled pattern suggests infiltrative cardiomyopathy. Normal right ventricular function. 4. Trace aortic regurgitation. 5. Mild to moderate tricuspid regurgitation. Moderate to severe  pulmonary hypertension. Pulmonary artery systolic pressure is estimated at 60 mm Hg. 6. Mild to moderate pulmonic regurgitation. 7. Moderate pericardial effusion with clear circumferential 1.6 cm is seen without hemodynamic compromise. 8. IVC is dilated with poor inspiration collapse consistent with elevated right atrial pressure. Compared to 10/31/14, LVH worse from moderate.   Radiology: Dg Chest 2 View  Result Date: 02/15/2016 CLINICAL DATA:  Patient reports fluid in the abdomen. History of CAD, hypertension, diabetes. History of dialysis. EXAM:  CHEST  2 VIEW COMPARISON:  10/25/2015 FINDINGS: The heart is enlarged and stable in configuration. There are no focal consolidations or pleural effusions. No pulmonary edema. IMPRESSION: Stable cardiomegaly. Electronically Signed   By: Nolon Nations M.D.   On: 02/15/2016 13:14   Ct Abdomen Pelvis W Contrast 02/15/2016: 1. Small to moderate volume ascites noted within the abdomen and pelvis. 2. Small to moderate pericardial effusion seen. 3. Diffuse coronary artery calcifications noted. 4. Aortic atherosclerosis. 5. Moderate bilateral renal atrophy, with scattered small bilateral renal cysts. Electronically Signed   By: Garald Balding M.D.   On: 02/15/2016 02:54   ULTRASOUND GUIDED RIGHT LATERAL ABDOMEN PARACENTESIS 02/15/2016: FINDINGS: A total of approximately 3.4 liters of clear light orange fluid was removed. Samples were sent to the laboratory as requested by the clinical team.  Scheduled Meds: . amLODipine  10 mg Oral Daily  . atorvastatin  40 mg Oral q1800  . cloNIDine  0.3 mg Oral TID  . clopidogrel  75 mg Oral QHS  . docusate sodium  100 mg Oral BID  . [START ON 02/23/2016] doxercalciferol  4 mcg Intravenous Q M,W,F-HD  . heparin  5,000 Units Subcutaneous Q8H  . hydrALAZINE  75 mg Oral Q8H  . insulin aspart  0-9 Units Subcutaneous TID WC  . insulin aspart protamine- aspart  23 Units Subcutaneous Q supper  . insulin aspart protamine- aspart  25 Units Subcutaneous Q breakfast  . isosorbide mononitrate  120 mg Oral Daily  . labetalol  300 mg Oral TID  . ondansetron      . sevelamer carbonate  3,200 mg Oral TID WC   Continuous Infusions:  PRN Meds:.acetaminophen, bisacodyl, bisacodyl, ondansetron, traMADol, zolpidem   Assessment/Plan:  1.  Pericardial effusion, moderate sized without  hemodynamic consequence, patient in fact is hypertensive.   2.  Ascites probably secondary to congestive heart failure, most probably combination of biventricular failure with acute on chronic  diastolic heart failure and pulmonary hypertension with RV failure. Echocardiogram suggests infiltrative cardiomyopathy, suspect secondary Amyloid infiltration in view of chronic renal failure, uncontrolled DM and hypertension that is chronic and uncontrolled may also lead to this clinical presentation. 3. Severe hypoalbuminemia, Serum albumin 2.5 today new finding,may also be contributing to pericardial effusion and ascites.   4. 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 5. Hypertension, uncontrolled. Renal artery duplex negative for renal artery stenosis. 6.  Hyperlipidemia  Recommendation: extremely difficult situation in a patient with multiple medical comorbidities and continued recurrent acute relation of ascites. She has had dialysis today. It would be the best time to proceed with right heart catheterization to evaluate for pulmonary hypertension as one of the etiologies for predominantly right-sided heart failure. I have discussed the procedural technique, complications with the patient and she is willing to proceed.  I have requested Dr. Pierre Bali to perform the procedure for me, patient is aware.  Adrian Prows, M.D. 02/21/2016, 3:09 PM [facility] Pager: [phone] Office: [phone] If  no answer: [phone]

## 2016-02-21 NOTE — H&P (View-Only) (Signed)
Subjective:  Still complains of abdominal discomfort and recurrence of abdominal bloating. Objective:  Vital Signs in the last 24 hours: Temp:  [97.3 F (36.3 C)-98.7 F (37.1 C)] 97.3 F (36.3 C) (09/27 1143) Pulse Rate:  [63-70] 70 (09/27 1401) Resp:  [16-20] 17 (09/27 1143) BP: (136-192)/(60-81) 184/81 (09/27 1401) SpO2:  [97 %-98 %] 98 % (09/27 0740) Weight:  [91.8 kg (202 lb 6.1 oz)-95.3 kg (210 lb 1.6 oz)] 91.8 kg (202 lb 6.1 oz) (09/27 1143)  Intake/Output from previous day: 09/26 0701 - 09/27 0700 In: 1 [P.O.:220] Out: -   Physical Exam: General appearance: alert, cooperative, appears older than stated age, looks chronically ill and no distress Lungs: clear to auscultation bilaterally  Cardiac: S1 and S2 is normal,  2/6 systolic ejection murmur in the left parasternal region is heard.  No gallop. Chest wall: no tenderness Abdomen: soft, mildly tender, bowel sounds normal; no masses,  no organomegaly and mildly distended, ascites present. Extremities: extremities normal, atraumatic, no cyanosis or edema Pulses: Bilateral hair loss present, femoral pulses 2+ with a lateral bruit, popliteal pulse faint bilateral.  Pedal pulses absent bilaterally.  Capillary refill normal. Left forearm AV fistula, functioning. Neurologic: Grossly normal Lab Results: BMP  Recent Labs  02/19/16 0947 02/21/16 0454 02/21/16 0811  NA 132* 135 135  K 5.1 5.2* 5.4*  CL 92* 93* 93*  CO2 26 30 24   GLUCOSE 143* 118* 87  BUN 50* 41* 41*  CREATININE 7.54* 7.23* 7.27*  CALCIUM 9.6 9.8 9.7  GFRNONAA 6* 6* 6*  GFRAA 7* 7* 7*    CBC  Recent Labs Lab 02/21/16 0810  WBC 8.3  RBC 3.34*  HGB 9.2*  HCT 31.2*  PLT 332  MCV 93.4  MCH 27.5  MCHC 29.5*  RDW 17.8*    HEMOGLOBIN A1C Lab Results  Component Value Date   HGBA1C 11.2 (H) 02/15/2016   MPG 275 02/15/2016     Recent Labs  08/26/15 1433  TROPONINI 0.05*   Recent Labs  08/26/15 1433 02/14/16 1841  02/19/16 0947  02/21/16 0454 02/21/16 0811  PROT 6.6 6.2*  --   --  6.1*  --   ALBUMIN 3.6 2.8*  < > 2.5* 2.6* 2.5*  AST 19 12*  --   --  12*  --   ALT 15 11*  --   --  10*  --   ALKPHOS 63 236*  --   --  173*  --   BILITOT 1.0 0.8  --   --  0.5  --   < > = values in this interval not displayed.  EKG 02/15/2016: Normal sinus rhythm at a rate of 69 bpm, left axis deviation, left anterior fascicular block.  Poor R-wave progression, cannot exclude anterior infarct old.  LVH with repolarization abnormality, cannot exclude high lateral ischemia.  Compared to 0/05/2015, no significant changes evident, ST segment changes in V5 and V6 much improved.   Out-patient:  Echocardiogram 12/28/2015: 1. Left ventricle cavity is normal in size. Severe concentric hypertrophy of the left ventricle with speckled pattern suggestive of infiltrative cardiomyopathy. Normal global wall motion. Doppler evidence of grade II (pseudonormal) diastolic dysfunction. Diastolic dysfunction findings suggests elevated LA/LV end diastolic pressure. Calculated EF 64%. 2. Severe bi-atrial enlargement, LA measures 6.3 cm. 3. Right ventricle cavity is normal in size. Moderate concentric hypertrophy of the right ventricle. Speckled pattern suggests infiltrative cardiomyopathy. Normal right ventricular function. 4. Trace aortic regurgitation. 5. Mild to moderate tricuspid regurgitation. Moderate to severe  pulmonary hypertension. Pulmonary artery systolic pressure is estimated at 60 mm Hg. 6. Mild to moderate pulmonic regurgitation. 7. Moderate pericardial effusion with clear circumferential 1.6 cm is seen without hemodynamic compromise. 8. IVC is dilated with poor inspiration collapse consistent with elevated right atrial pressure. Compared to 10/31/14, LVH worse from moderate.   Radiology: Dg Chest 2 View  Result Date: 02/15/2016 CLINICAL DATA:  Patient reports fluid in the abdomen. History of CAD, hypertension, diabetes. History of dialysis. EXAM:  CHEST  2 VIEW COMPARISON:  10/25/2015 FINDINGS: The heart is enlarged and stable in configuration. There are no focal consolidations or pleural effusions. No pulmonary edema. IMPRESSION: Stable cardiomegaly. Electronically Signed   By: Nolon Nations M.D.   On: 02/15/2016 13:14   Ct Abdomen Pelvis W Contrast 02/15/2016: 1. Small to moderate volume ascites noted within the abdomen and pelvis. 2. Small to moderate pericardial effusion seen. 3. Diffuse coronary artery calcifications noted. 4. Aortic atherosclerosis. 5. Moderate bilateral renal atrophy, with scattered small bilateral renal cysts. Electronically Signed   By: Garald Balding M.D.   On: 02/15/2016 02:54   ULTRASOUND GUIDED RIGHT LATERAL ABDOMEN PARACENTESIS 02/15/2016: FINDINGS: A total of approximately 3.4 liters of clear light orange fluid was removed. Samples were sent to the laboratory as requested by the clinical team.  Scheduled Meds: . amLODipine  10 mg Oral Daily  . atorvastatin  40 mg Oral q1800  . cloNIDine  0.3 mg Oral TID  . clopidogrel  75 mg Oral QHS  . docusate sodium  100 mg Oral BID  . [START ON 02/23/2016] doxercalciferol  4 mcg Intravenous Q M,W,F-HD  . heparin  5,000 Units Subcutaneous Q8H  . hydrALAZINE  75 mg Oral Q8H  . insulin aspart  0-9 Units Subcutaneous TID WC  . insulin aspart protamine- aspart  23 Units Subcutaneous Q supper  . insulin aspart protamine- aspart  25 Units Subcutaneous Q breakfast  . isosorbide mononitrate  120 mg Oral Daily  . labetalol  300 mg Oral TID  . ondansetron      . sevelamer carbonate  3,200 mg Oral TID WC   Continuous Infusions:  PRN Meds:.acetaminophen, bisacodyl, bisacodyl, ondansetron, traMADol, zolpidem   Assessment/Plan:  1.  Pericardial effusion, moderate sized without  hemodynamic consequence, patient in fact is hypertensive.   2.  Ascites probably secondary to congestive heart failure, most probably combination of biventricular failure with acute on chronic  diastolic heart failure and pulmonary hypertension with RV failure. Echocardiogram suggests infiltrative cardiomyopathy, suspect secondary Amyloid infiltration in view of chronic renal failure, uncontrolled DM and hypertension that is chronic and uncontrolled may also lead to this clinical presentation. 3. Severe hypoalbuminemia, Serum albumin 2.5 today new finding,may also be contributing to pericardial effusion and ascites.   4. 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 5. Hypertension, uncontrolled. Renal artery duplex negative for renal artery stenosis. 6.  Hyperlipidemia  Recommendation: extremely difficult situation in a patient with multiple medical comorbidities and continued recurrent acute relation of ascites. She has had dialysis today. It would be the best time to proceed with right heart catheterization to evaluate for pulmonary hypertension as one of the etiologies for predominantly right-sided heart failure. I have discussed the procedural technique, complications with the patient and she is willing to proceed.  I have requested Dr. Pierre Bali to perform the procedure for me, patient is aware.  Adrian Prows, M.D. 02/21/2016, 3:09 PM [facility] Pager: [phone] Office: [phone] If  no answer: [phone]

## 2016-02-22 ENCOUNTER — Encounter (HOSPITAL_COMMUNITY): Payer: Self-pay | Admitting: Internal Medicine

## 2016-02-22 DIAGNOSIS — I5033 Acute on chronic diastolic (congestive) heart failure: Secondary | ICD-10-CM

## 2016-02-22 LAB — GLUCOSE, CAPILLARY
Glucose-Capillary: 197 mg/dL — ABNORMAL HIGH (ref 65–99)
Glucose-Capillary: 67 mg/dL (ref 65–99)
Glucose-Capillary: 136 mg/dL — ABNORMAL HIGH (ref 65–99)
Glucose-Capillary: 172 mg/dL — ABNORMAL HIGH (ref 65–99)
Glucose-Capillary: 130 mg/dL — ABNORMAL HIGH (ref 65–99)

## 2016-02-22 LAB — RENAL FUNCTION PANEL
Albumin: 2.7 g/dL — ABNORMAL LOW (ref 3.5–5.0)
Anion gap: 13 (ref 5–15)
BUN: 33 mg/dL — ABNORMAL HIGH (ref 6–20)
CALCIUM: 9.5 mg/dL (ref 8.9–10.3)
CHLORIDE: 94 mmol/L — AB (ref 101–111)
CO2: 27 mmol/L (ref 22–32)
CREATININE: 5.85 mg/dL — AB (ref 0.44–1.00)
GFR, EST AFRICAN AMERICAN: 10 mL/min — AB (ref >60)
GFR, EST NON AFRICAN AMERICAN: 8 mL/min — AB (ref >60)
Glucose, Bld: 58 mg/dL — ABNORMAL LOW (ref 65–99)
Phosphorus: 6.4 mg/dL — ABNORMAL HIGH (ref 2.5–4.6)
Potassium: 4.7 mmol/L (ref 3.5–5.1)
Sodium: 134 mmol/L — ABNORMAL LOW (ref 135–145)

## 2016-02-22 LAB — CBC
HCT: 32.7 % — ABNORMAL LOW (ref 36.0–46.0)
Hemoglobin: 10.1 g/dL — ABNORMAL LOW (ref 12.0–15.0)
MCH: 28.9 pg (ref 26.0–34.0)
MCHC: 30.9 g/dL (ref 30.0–36.0)
MCV: 93.4 fL (ref 78.0–100.0)
PLATELETS: 382 10*3/uL (ref 150–400)
RBC: 3.5 MIL/uL — AB (ref 3.87–5.11)
RDW: 17.9 % — ABNORMAL HIGH (ref 11.5–15.5)
WBC: 8.5 10*3/uL (ref 4.0–10.5)

## 2016-02-22 MED ORDER — LIDOCAINE-PRILOCAINE 2.5-2.5 % EX CREA
1.0000 "application " | TOPICAL_CREAM | CUTANEOUS | Status: DC | PRN
  Filled 2016-02-22: qty 5

## 2016-02-22 MED ORDER — INSULIN ASPART PROT & ASPART (70-30 MIX) 100 UNIT/ML ~~LOC~~ SUSP
18.0000 [IU] | Freq: Every day | SUBCUTANEOUS | Status: DC
  Filled 2016-02-22: qty 10

## 2016-02-22 MED ORDER — BISACODYL 10 MG RE SUPP
10.0000 mg | Freq: Once | RECTAL | Status: DC
  Filled 2016-02-22: qty 1

## 2016-02-22 MED ORDER — HEPARIN SODIUM (PORCINE) 1000 UNIT/ML DIALYSIS
3000.0000 [IU] | INTRAMUSCULAR | Status: DC | PRN
  Filled 2016-02-22: qty 3

## 2016-02-22 MED ORDER — HEPARIN SODIUM (PORCINE) 1000 UNIT/ML DIALYSIS
1000.0000 [IU] | INTRAMUSCULAR | Status: DC | PRN
  Filled 2016-02-22: qty 1

## 2016-02-22 MED ORDER — SENNA 8.6 MG PO TABS
2.0000 | ORAL_TABLET | Freq: Every day | ORAL | Status: DC
  Administered 2016-02-22 – 2016-02-24 (×3): 17.2 mg via ORAL
  Filled 2016-02-22 (×3): qty 2

## 2016-02-22 MED ORDER — ALTEPLASE 2 MG IJ SOLR
2.0000 mg | Freq: Once | INTRAMUSCULAR | Status: DC | PRN
  Filled 2016-02-22: qty 2

## 2016-02-22 MED ORDER — LIDOCAINE HCL (PF) 1 % IJ SOLN
5.0000 mL | INTRAMUSCULAR | Status: DC | PRN
  Filled 2016-02-22: qty 5

## 2016-02-22 MED ORDER — PENTAFLUOROPROP-TETRAFLUOROETH EX AERO: 1.0000 "application " | INHALATION_SPRAY | CUTANEOUS | Status: DC | PRN

## 2016-02-22 MED ORDER — DEXTROSE 5 % IV SOLN
2.0000 g | INTRAVENOUS | Status: DC
  Administered 2016-02-22 – 2016-02-23 (×2): 2 g via INTRAVENOUS
  Filled 2016-02-22 (×3): qty 2

## 2016-02-22 MED ORDER — SODIUM CHLORIDE 0.9 % IV SOLN: 100.0000 mL | INTRAVENOUS | Status: DC | PRN

## 2016-02-22 MED FILL — Heparin Sodium (Porcine) 2 Unit/ML in Sodium Chloride 0.9%: INTRAMUSCULAR | Qty: 500 | Status: AC

## 2016-02-22 NOTE — Progress Notes (Signed)
Inpatient Diabetes Program Recommendations  AACE/ADA: New Consensus Statement on Inpatient Glycemic Control (2015)  Target Ranges:  Prepandial:   less than 140 mg/dL      Peak postprandial:   less than 180 mg/dL (1-2 hours)      Critically ill patients:  140 - 180 mg/dL   Lab Results  Component Value Date   GLUCAP 67 02/22/2016   HGBA1C 11.2 (H) 02/15/2016    Review of Glycemic Control Results for [name], [name] (MRN [id number]) as of 02/22/2016 14:42  Ref. Range 02/21/2016 18:12 02/21/2016 19:29 02/21/2016 20:59 02/22/2016 07:33 02/22/2016 11:55  Glucose-Capillary Latest Ref Range: 65 - 99 mg/dL 193 (H) 167 (H) 212 (H) 197 (H) 67   Diabetes history: DM2 Outpatient Diabetes medications: Humalog 50/50 28 units QAM, Humalog 50/50 26 units QPM Current orders for Inpatient glycemic control: 70/30 25 units ac breakfast+  23 units ac dinner+Novolog correction 0-9 units tid  Inpatient Diabetes Program Recommendations: Reviewed CBGs. Please consider decrease in 70/30 insulin to 18 units bid.  Thank you, Nani Gasser. Adreonna Yontz, RN, MSN, CDE Inpatient Glycemic Control Team Team Pager [phone] (8am-5pm) 02/22/2016 2:45 PM

## 2016-02-22 NOTE — Progress Notes (Signed)
Hypoglycemic Event  CBG: 67  Treatment: 15 GM carbohydrate snack  Symptoms: Shaky and Hungry   Follow-up CBG: Time:1523 CBG Result:136  Possible Reasons for Event: Inadequate meal intake  Comments/MD notified: Dr Tat notified         Michelene Heady

## 2016-02-22 NOTE — Progress Notes (Signed)
[facility] Progress Note   Dialysis Orders: MWF NW  4h 38min   95.5kg  2/2 bath  P2   Hep 3000  AVF Hect 6 ug Mircera 225 ug q 2wk last 9/20 Auryxia 2 tab ac and renvela 6 ac Sensipar 60/d - not taking Ca 10, P 7.2 Hb 9.3   Assessment/Plan: 1. New onset ascites - seen by cardiology and CHF team.  W/U in progress, ECHO showing severe pulm HTN, grade 3 diast dysfunction. Hx of prolonged severe HTN. ANA neg renal artery U/S without stenosis.  If high clinical suspicion for cardiac amyloid could get kappa and lambda serum light chains along with SPEP to see if there are any glaring abnormalities but ratio is difficult to interpret in setting of ESRD if it is slightly abnormal.  GI consulted, rec SBP prophylaxis 2. 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  Will attempt vol removal as below for optimization, cardiology following  3. HTN/ vol- 4 kg under dry wt and still BP's are up.  Has room for lowering dry wt further, will challenge again in  9/29 for next planned dialysis.  Large IDWG.  Not adhering to fluid restriction. 4. SBP - GS and peritoneal fluid cx's negative so far, on emp Rocephin due to [facility].  Cultures negative.  Gi following, will need SBP ppx as OP. 5. ESRD HD MWF- continuing to challenge wt 6. Anorexia/ wt loss- per primary  7. Anemia - no need for ESA, last mircera 225 dose 9/20, won't be due to 2 weeks hgb 9.2 - consistent with last outpt Hgb which has been trending down tsat 51% in August 8. 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   Also had repeat LVP yesterday with 1.1L removed.  Objective Vitals:   02/21/16 1820 02/21/16 1825 02/21/16 2053 02/22/16 0546  BP: (!) 187/72 (!) 187/70 (!) 163/66 (!) 169/73  Pulse: 71 71 70 64  Resp: 16 17    Temp:    98.1 F (36.7 C)  TempSrc:    Oral  SpO2: 98% 93% 98% 100%  Weight:    90.4 kg (199 lb 6.4 oz)  Height:       Physical Exam General: chronically ill-appearing,  Heart: RRR, occasional atrial kick Lungs: muffled at bases, improved from prior Abdomen: + fluid wave, distended, mildly tender Extremities: trace LE edema Dialysis Access:  L AVF + bruit and thrill    Additional Objective Labs: Basic Metabolic Panel:  Recent Labs Lab 02/18/16 0535 02/19/16 0947 02/21/16 0454 02/21/16 0811 02/21/16 2032  NA 135 132* 135 135  --   K 4.4 5.1 5.2* 5.4*  --   CL 93* 92* 93* 93*  --   CO2 30 26 30 24   --   GLUCOSE 164* 143* 118* 87  --   BUN 40* 50* 41* 41*  --   CREATININE 6.31* 7.54* 7.23* 7.27* 4.96*  CALCIUM 9.3 9.6 9.8 9.7  --  PHOS 6.8* 7.1*  --  7.5*  --    Liver Function Tests:  Recent Labs Lab 02/19/16 0947 02/21/16 0454 02/21/16 0811  AST  --  12*  --   ALT  --  10*  --   ALKPHOS  --  173*  --   BILITOT  --  0.5  --   PROT  --  6.1*  --   ALBUMIN 2.5* 2.6* 2.5*   No results for input(s): LIPASE, AMYLASE in the last 168 hours. CBC:  Recent Labs Lab 02/16/16 0832 02/19/16 1002 02/21/16 0810 02/21/16 2032  WBC 7.0 9.7 8.3 6.7  HGB 9.2* 9.2* 9.2* 9.1*  HCT 29.6* 29.8* 31.2* 30.3*  MCV 90.5 91.4 93.4 95.0  PLT 234 320 332 313   Blood Culture    Component Value Date/Time   SDES FLUID PERITONEAL 02/21/2016 1627   SDES FLUID PERITONEAL 02/21/2016 1627   SPECREQUEST NONE 02/21/2016 1627   SPECREQUEST NONE 02/21/2016 1627   CULT PENDING 02/21/2016 1627   REPTSTATUS 02/21/2016 FINAL 02/21/2016 1627   REPTSTATUS PENDING 02/21/2016 1627    CBG:  Recent Labs Lab 02/21/16 0720 02/21/16 1812  02/21/16 1929 02/21/16 2059 02/22/16 0733  GLUCAP 93 193* 167* 212* 197*   Lab Results  Component Value Date   INR 1.24 02/15/2016   INR 1.14 02/15/2015   INR 1.09 02/09/2015   Studies/Results: US Paracentesis  Result Date: 02/21/2016 INDICATION: End-stage renal disease on hemodialysis with heart failure. Ascites. Request for diagnostic and therapeutic paracentesis. EXAM: ULTRASOUND GUIDED DIAGNOSTIC AND THERAPEUTIC PARACENTESIS MEDICATIONS: 1% lidocaine COMPLICATIONS: None immediate. PROCEDURE: Informed written consent was obtained from the patient after a discussion of the risks, benefits and alternatives to treatment. A timeout was performed prior to the initiation of the procedure. Initial ultrasound scanning demonstrates a small amount of ascites within the left lower abdominal quadrant. The left lower abdomen was prepped and draped in the usual sterile fashion. 1% lidocaine was used for local anesthesia. Following this, a 19 gauge, 7-cm, Yueh catheter was introduced. An ultrasound image was saved for documentation purposes. The paracentesis was performed. The catheter was removed and a dressing was applied. The patient tolerated the procedure well without immediate post procedural complication. FINDINGS: A total of approximately 1.1 L of slightly cloudy yellow fluid was removed. Samples were sent to the laboratory as requested by the clinical team. IMPRESSION: Successful ultrasound-guided paracentesis yielding 1.1 liters of peritoneal fluid. Read by: Saverio Danker, PA-C Electronically Signed   By: Sandi Mariscal M.D.   On: 02/21/2016 16:24   Medications:   . amLODipine  10 mg Oral Daily  . atorvastatin  40 mg Oral q1800  . cloNIDine  0.3 mg Oral TID  . clopidogrel  75 mg Oral QHS  . docusate sodium  100 mg Oral BID  . [START ON 02/23/2016] doxercalciferol  4 mcg Intravenous Q M,W,F-HD  . heparin  5,000 Units Subcutaneous Q8H  . hydrALAZINE  75 mg Oral Q8H  . insulin aspart  0-9 Units  Subcutaneous TID WC  . insulin aspart protamine- aspart  23 Units Subcutaneous Q supper  . insulin aspart protamine- aspart  25 Units Subcutaneous Q breakfast  . isosorbide mononitrate  120 mg Oral Daily  . labetalol  300 mg Oral TID  . sevelamer carbonate  3,200 mg Oral TID WC  . sodium chloride flush  3 mL Intravenous Q12H  . sodium chloride flush  3 mL Intravenous Q12H      Madelon Lips MD [place]  [facility] pgr [phone] cell [phone]

## 2016-02-22 NOTE — Care Management Important Message (Signed)
Important Message  Patient Details  Name: [name] MRN: [id number] Date of Birth: 09-Oct-1975   Medicare Important Message Given:  Yes    [name] 02/22/2016, 3:26 PM

## 2016-02-22 NOTE — Progress Notes (Signed)
PROGRESS NOTE  [name] [id number] DOB: 1975/08/23 DOA: 02/14/2016 PCP: Janet Seashore, MD  Brief History:  40 year old female with PMH of ESRD on MWF HD, CIN III s/p LEEP, CAD status post cardiac 2012, type II DM/IDDM, HLD, HTN, PAD,? Chronic CHF (follows with Dr. Einar Gip) presented to [facility] ED on 02/14/20 with gradually worsening abdominal distention, fluctuating appetite, approximately 20 pound weight loss since April 2017, no reported change in bowel habits, compliant with dialysis. In ED noted to have clinical ascites and CT abdomen with contrast revealed small to moderate ascites and pericardial effusion.Admitted for further evaluation. Underwent diagnostic and therapeutic paracentesis which showed significant neutrophilic leukocytosis concerning for SBP and hence treated with IV Rocephin but final culture negative-DC IV Rocephin. Cardiology/advanced heart failure team evaluated and suspect patient's ascites is from right-sided heart failure related to poorly controlled/resistant hypertension and restricted cardiomyopathy. Nephrology trying to reduce volume across dialysis. Slowly reaccumulating ascitic fluid and abdominal pain--> repeat paracentesis performed 02/22/2016 removing 1.1 L   Assessment/Plan: Ascites - likely decompensated R-sided CHF - CT abdomen report as below was appreciated. Liver appears unremarkable & INR/1.24. -therapeutic paracentesis by IR 9/21: Ascitic fluid WBCs 4300, 82% neutrophils and 9% monocytes/macrophages, Gram stain negative, culture negative. Concern for SBP>Started IV ceftriaxone and now that culture is negative, DC on 9/26. - Cardiology input appreciated: Suspect biventricular failure with acute on chronic diastolic heart failure, pulmonary hypertension with RV failure - Advanced heart failure team input appreciated: Control severe HTN suspect severe LVH related to hypertensive cardiomyopathy  -not candidate for C MRI,  SAAG--0.6-->  NON-portal HTN--> suggestive of cardiac cirrhosis  - Nephrology follow-up appreciated.  -volume management across dialysis. -02/21/2016 and repeat paracentesis--1.1 L removed--calculate SAAG, follow cell count and culture - Ascitic fluid cytology: No malignant cells, reactive mesothelial cells and abundant acute inflammation present. Cultures negative. - ANA-NEG. Acute hepatitis panel: Negative. - ? Abdominal pain d/t bowel edema from fluid overload--out of proportion with clinical exam - Appreciate [facility] --case discussed with Dr. Rosalie Gums -restart ceftriaxone as repeat ascites cell count with WBC 1545 suggests SBP -Pt states she has no problems taking amoxicillin-->plan d/c with Augmentin for long term SBP prophlyaxis as pt already has well documented prolonged QTc--hesitate to use cipro  Pericardial effusion - Management as outlined in ascites. Repeat echo. No clinical features of cardiac tamponade. - As per cardiology, moderate size without hemodynamic compromise.  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  Uncontrolled type II DM/IDDM -Check A1c: 11.2 suggesting very poor outpatient control. - CBGs reasonably controlled over the last couple days.? Issues with compliance with all medications at home given good control of her diabetes in the hospital with stated home regimen. - Due to very tight control of CBGs, reduced insulin on 9/28  Essential hypertension - Uncontrolled. Continue with amlodipine, clonidine, hydralazine, labetalol and Imdur. When necessary IV hydralazine. -  As per discussion with her cardiologist, issues with compliance and persistently markedly elevated blood  pressures. Attempt volume removal across dialysis and reassess blood pressure with her home regimen.  - As discussed with primary cardiologist, check renal artery Doppler--neg for RAS  Hyperlipidemia - Continue statins.  CAD - Continue Plavix, statins. Asymptomatic of chest pain.  Weight  loss - Unclear etiology.? Related to ascites.  Anemia of CKD/Chronic disease - Hgb stable  Constipation - Despite home dose of Dulcolax 20 mg and Dulcolax suppository, no BM. Had BM after an enema on 9/25.  DVT prophylaxis:Heparin Code Status:Full Family Communication:Daughter update at bedside 9/28--Total time spent 35 minutes.  Greater than 50% spent face to face counseling and coordinating care.  Disposition Plan:DC home 02/23/16 if cleared by nephrology   Consultants:  Nephrology  IR  Cardiology  Advanced heart failure team.  Procedures:  HD  3.4 L paracentesis by IR on 9/21.  1.1 L paracentesis on 9/27  Antimicrobials:  Ceftriaxone 9/22-->9/26  Ceftriaxone 9/28-->          Subjective: Patient complains of abdominal pain. Denies any fevers, chills, chest pain, shortness breath, nausea, vomiting, diarrhea. She complains of some constipation. Denies any hematochezia or melena. She states abdominal pain is suprapubic and in the left and right lower quadrant sharp in nature and constant. She states that it is about the same as the last 24 hours.  Objective: Vitals:   02/21/16 1820 02/21/16 1825 02/21/16 2053 02/22/16 0546  BP: (!) 187/72 (!) 187/70 (!) 163/66 (!) 169/73  Pulse: 71 71 70 64  Resp: 16 17    Temp:    98.1 F (36.7 C)  TempSrc:    Oral  SpO2: 98% 93% 98% 100%  Weight:    90.4 kg (199 lb 6.4 oz)  Height:       No intake or output data in the 24 hours ending 02/22/16 1731 Weight change:  Exam:   General:  Pt is alert, follows commands appropriately, not in acute distress  HEENT: No icterus, No thrush, No neck mass,  [place]/AT  Cardiovascular: RRR, S1/S2, no rubs, no gallops  Respiratory: CTA bilaterally, no wheezing, no crackles, no rhonchi  Abdomen: Soft/+BS, LLQ, RLQ pain without rebound, non distended, no guarding  Extremities: No edema, No lymphangitis, No petechiae, No rashes, no synovitis   Data Reviewed: I have personally reviewed following labs and imaging studies Basic Metabolic Panel:  Recent Labs Lab 02/16/16 0832 02/18/16 0535 02/19/16 0947 02/21/16 0454 02/21/16 0811 02/21/16 2032 02/22/16 1240  NA 131* 135 132* 135 135  --  134*  K 4.4 4.4 5.1 5.2* 5.4*  --  4.7  CL 92* 93* 92* 93* 93*  --  94*  CO2 27 30 26 30 24   --  27  GLUCOSE 79 164* 143* 118* 87  --  58*  BUN 41* 40* 50* 41* 41*  --  33*  CREATININE 6.17* 6.31* 7.54* 7.23* 7.27* 4.96* 5.85*  CALCIUM 9.0 9.3 9.6 9.8 9.7  --  9.5  PHOS 6.9* 6.8* 7.1*  --  7.5*  --  6.4*   Liver Function Tests:  Recent Labs Lab 02/16/16 [id number] 02/19/16 0947 02/21/16 0454 02/21/16 0811 02/22/16 1240  AST  --   --  12*  --   --   ALT  --   --  10*  --   --   ALKPHOS  --   --  173*  --   --   BILITOT  --   --  0.5  --   --   PROT  --   --  6.1*  --   --   ALBUMIN 2.3* 2.5* 2.6* 2.5* 2.7*   No results for input(s): LIPASE, AMYLASE in the last 168 hours. No results for input(s): AMMONIA in the last 168 hours. Coagulation Profile: No results  for input(s): INR, PROTIME in the last 168 hours. CBC:  Recent Labs Lab 02/16/16 0832 02/19/16 1002 02/21/16 0810 02/21/16 2032 02/22/16 1240  WBC 7.0 9.7 8.3 6.7 8.5  HGB 9.2* 9.2* 9.2* 9.1* 10.1*  HCT 29.6* 29.8* 31.2* 30.3* 32.7*  MCV 90.5 91.4 93.4 95.0 93.4  PLT 234 320 332 313 382   Cardiac Enzymes: No results for input(s): CKTOTAL, CKMB, CKMBINDEX, TROPONINI in the last 168 hours. BNP: Invalid input(s): POCBNP CBG:  Recent Labs Lab 02/21/16 1929 02/21/16 2059 02/22/16 0733 02/22/16 1155 02/22/16 1523  GLUCAP 167* 212* 197* 67 136*   HbA1C: No results for  input(s): HGBA1C in the last 72 hours. Urine analysis:    Component Value Date/Time   COLORURINE YELLOW 02/08/2011 1812   APPEARANCEUR CLEAR 02/08/2011 1812   LABSPEC 1.016 02/08/2011 1812   PHURINE 8.0 02/08/2011 1812   GLUCOSEU 250 (A) 02/08/2011 1812   HGBUR NEGATIVE 02/08/2011 1812   BILIRUBINUR NEGATIVE 02/08/2011 1812   KETONESUR NEGATIVE 02/08/2011 1812   PROTEINUR >300 (A) 02/08/2011 1812   UROBILINOGEN 0.2 02/08/2011 1812   NITRITE NEGATIVE 02/08/2011 1812   LEUKOCYTESUR NEGATIVE 02/08/2011 1812   Sepsis Labs: @LABRCNTIP (procalcitonin:4,lacticidven:4) ) Recent Results (from the past 240 hour(s))  Culture, body fluid-bottle     Status: None   Collection Time: 02/15/16  1:41 PM  Result Value Ref Range Status   Specimen Description FLUID ABDOMEN PERITONEAL  Final   Special Requests NONE  Final   Culture NO GROWTH 5 DAYS  Final   Report Status 02/20/2016 FINAL  Final  Gram stain     Status: None   Collection Time: 02/15/16  1:41 PM  Result Value Ref Range Status   Specimen Description FLUID ABDOMEN PERITONEAL  Final   Special Requests NONE  Final   Gram Stain   Final    RARE WBC PRESENT, PREDOMINANTLY PMN NO ORGANISMS SEEN    Report Status 02/15/2016 FINAL  Final  MRSA PCR Screening     Status: None   Collection Time: 02/16/16  7:25 AM  Result Value Ref Range Status   MRSA by PCR NEGATIVE NEGATIVE Final    Comment:        The GeneXpert MRSA Assay (FDA approved for NASAL specimens only), is one component of a comprehensive MRSA colonization surveillance program. It is not intended to diagnose MRSA infection nor to guide or monitor treatment for MRSA infections.   Gram stain     Status: None   Collection Time: 02/21/16  4:27 PM  Result Value Ref Range Status   Specimen Description FLUID PERITONEAL  Final   Special Requests NONE  Final   Gram Stain   Final    RARE WBC PRESENT, PREDOMINANTLY PMN NO ORGANISMS SEEN    Report Status 02/21/2016 FINAL  Final   Culture, body fluid-bottle     Status: None (Preliminary result)   Collection Time: 02/21/16  4:27 PM  Result Value Ref Range Status   Specimen Description FLUID PERITONEAL  Final   Special Requests NONE  Final   Gram Stain   Final    RARE WBC PRESENT, PREDOMINANTLY PMN NO ORGANISMS SEEN    Culture NO GROWTH < 24 HOURS  Final   Report Status PENDING  Incomplete     Scheduled Meds: . amLODipine  10 mg Oral Daily  . atorvastatin  40 mg Oral q1800  . cefTRIAXone (ROCEPHIN)  IV  2 g Intravenous Q24H  . cloNIDine  0.3 mg Oral TID  .  clopidogrel  75 mg Oral QHS  . docusate sodium  100 mg Oral BID  . [START ON 02/23/2016] doxercalciferol  4 mcg Intravenous Q M,W,F-HD  . heparin  5,000 Units Subcutaneous Q8H  . hydrALAZINE  75 mg Oral Q8H  . insulin aspart  0-9 Units Subcutaneous TID WC  . [START ON 02/23/2016] insulin aspart protamine- aspart  18 Units Subcutaneous Q breakfast  . insulin aspart protamine- aspart  23 Units Subcutaneous Q supper  . isosorbide mononitrate  120 mg Oral Daily  . labetalol  300 mg Oral TID  . sevelamer carbonate  3,200 mg Oral TID WC  . sodium chloride flush  3 mL Intravenous Q12H  . sodium chloride flush  3 mL Intravenous Q12H   Continuous Infusions:   Procedures/Studies: Dg Chest 2 View  Result Date: 02/15/2016 CLINICAL DATA:  Patient reports fluid in the abdomen. History of CAD, hypertension, diabetes. History of dialysis. EXAM: CHEST  2 VIEW COMPARISON:  10/25/2015 FINDINGS: The heart is enlarged and stable in configuration. There are no focal consolidations or pleural effusions. No pulmonary edema. IMPRESSION: Stable cardiomegaly. Electronically Signed   By: Nolon Nations M.D.   On: 02/15/2016 13:14   Ct Abdomen Pelvis W Contrast  Result Date: 02/15/2016 CLINICAL DATA:  Acute onset of generalized abdominal distention, nausea and vomiting. Abdominal pain. Initial encounter. EXAM: CT ABDOMEN AND PELVIS WITH CONTRAST TECHNIQUE: Multidetector CT  imaging of the abdomen and pelvis was performed using the standard protocol following bolus administration of intravenous contrast. CONTRAST:  100 mL ISOVUE-300 IOPAMIDOL (ISOVUE-300) INJECTION 61% COMPARISON:  MRI of the lumbar spine performed 11/02/2015, and CT of the abdomen and pelvis from 05/24/2014 FINDINGS: Lower chest: A small to moderate pericardial effusion is noted. Diffuse coronary artery calcifications are seen. The visualized lung bases appear grossly clear. Hepatobiliary: The liver is grossly unremarkable in appearance. The gallbladder is grossly unremarkable, though difficult to fully assess given adjacent ascites. The common bile duct remains normal in caliber. Pancreas: The pancreas is within normal limits. Spleen: The spleen is unremarkable in appearance. Adrenals/Urinary Tract: The adrenal glands are unremarkable in appearance. There is moderate renal atrophy bilaterally, with scattered small bilateral renal cysts and underlying vascular calcifications at both renal hila. There is no evidence of hydronephrosis. Stomach/Bowel: The stomach is unremarkable in appearance. The small bowel is within normal limits. The appendix is normal in caliber, without evidence of appendicitis. The colon is unremarkable in appearance. Vascular/Lymphatic: Relatively diffuse calcification is seen along the abdominal aorta and its branches. The inferior vena cava is grossly unremarkable. No pelvic sidewall lymphadenopathy is seen. There is no evidence of retroperitoneal or inguinal lymphadenopathy. Reproductive: The bladder is mildly distended and within normal limits. The uterus is grossly unremarkable in appearance. The ovaries are relatively symmetric. No suspicious adnexal masses are seen. Other: Small to moderate volume ascites is seen within the abdomen and pelvis. No additional soft tissue abnormalities are seen. Musculoskeletal: No acute osseous abnormalities are identified. The visualized musculature is  unremarkable in appearance. IMPRESSION: 1. Small to moderate volume ascites noted within the abdomen and pelvis. 2. Small to moderate pericardial effusion seen. 3. Diffuse coronary artery calcifications noted. 4. Aortic atherosclerosis. 5. Moderate bilateral renal atrophy, with scattered small bilateral renal cysts. Electronically Signed   By: Garald Balding M.D.   On: 02/15/2016 02:54   US Paracentesis  Result Date: 02/21/2016 INDICATION: End-stage renal disease on hemodialysis with heart failure. Ascites. Request for diagnostic and therapeutic paracentesis. EXAM: ULTRASOUND GUIDED DIAGNOSTIC AND  THERAPEUTIC PARACENTESIS MEDICATIONS: 1% lidocaine COMPLICATIONS: None immediate. PROCEDURE: Informed written consent was obtained from the patient after a discussion of the risks, benefits and alternatives to treatment. A timeout was performed prior to the initiation of the procedure. Initial ultrasound scanning demonstrates a small amount of ascites within the left lower abdominal quadrant. The left lower abdomen was prepped and draped in the usual sterile fashion. 1% lidocaine was used for local anesthesia. Following this, a 19 gauge, 7-cm, Yueh catheter was introduced. An ultrasound image was saved for documentation purposes. The paracentesis was performed. The catheter was removed and a dressing was applied. The patient tolerated the procedure well without immediate post procedural complication. FINDINGS: A total of approximately 1.1 L of slightly cloudy yellow fluid was removed. Samples were sent to the laboratory as requested by the clinical team. IMPRESSION: Successful ultrasound-guided paracentesis yielding 1.1 liters of peritoneal fluid. Read by: Saverio Danker, PA-C Electronically Signed   By: Sandi Mariscal M.D.   On: 02/21/2016 16:24   US Paracentesis  Result Date: 02/15/2016 INDICATION: End stage renal failure on hemodialysis. Ascites. Request for diagnostic and therapeutic paracentesis. EXAM: ULTRASOUND  GUIDED RIGHT LATERAL ABDOMEN PARACENTESIS MEDICATIONS: 1% Lidocaine. COMPLICATIONS: None immediate. PROCEDURE: Informed written consent was obtained from the patient after a discussion of the risks, benefits and alternatives to treatment. A timeout was performed prior to the initiation of the procedure. Initial ultrasound scanning demonstrates a moderate amount of ascites within the right lateral abdomen. The right lateral abdomen was prepped and draped in the usual sterile fashion. 1% lidocaine with epinephrine was used for local anesthesia. Following this, a 19 gauge, 7-cm, Yueh catheter was introduced. An ultrasound image was saved for documentation purposes. The paracentesis was performed. The catheter was removed and a dressing was applied. The patient tolerated the procedure well without immediate post procedural complication. FINDINGS: A total of approximately 3.4 liters of clear light orange fluid was removed. Samples were sent to the laboratory as requested by the clinical team. IMPRESSION: Successful ultrasound-guided paracentesis yielding 3.4 liters of peritoneal fluid. Read by:  Gareth [facility], PA-C Electronically Signed   By: Corrie Mckusick D.O.   On: 02/15/2016 13:09    Navin Dogan, DO  [facility] Pager [phone]  If 7PM-7AM, please contact night-coverage [email or web address] Password [facility] 02/22/2016, 5:31 PM   LOS: 7 days

## 2016-02-22 NOTE — Progress Notes (Addendum)
[facility] Gastroenterology Progress Note  [name] 40 y.o. 04/29/1976  CC:  Ascites   Subjective: Patient continues to have lower quadrant discomfort but is improved after paracentesis. Remains afebrile. Denied nausea or vomiting.  ROS : Complaining of lower quadrant discomfort. Denied nausea or vomiting.   Objective: Vital signs in last 24 hours: Vitals:   02/21/16 2053 02/22/16 0546  BP: (!) 163/66 (!) 169/73  Pulse: 70 64  Resp:    Temp:  98.1 F (36.7 C)    Physical Exam:  General:   Alert,  Well-developed, well-nourished, pleasant and cooperative in NAD Scleral icterus noted. Lungs:  Clear throughout to auscultation.   No wheezes, crackles, or rhonchi. No acute distress. Heart:  Regular rate and rhythm; no murmurs, clicks, rubs,  or gallops. Abdomen: Distended. Lower quadrant discomfort noted. Bowel sounds present. LE : No edema  Lab Results:  Recent Labs  02/19/16 0947 02/21/16 0454 02/21/16 0811 02/21/16 2032  NA 132* 135 135  --   K 5.1 5.2* 5.4*  --   CL 92* 93* 93*  --   CO2 26 30 24   --   GLUCOSE 143* 118* 87  --   BUN 50* 41* 41*  --   CREATININE 7.54* 7.23* 7.27* 4.96*  CALCIUM 9.6 9.8 9.7  --   PHOS 7.1*  --  7.5*  --     Recent Labs  02/21/16 0454 02/21/16 0811  AST 12*  --   ALT 10*  --   ALKPHOS 173*  --   BILITOT 0.5  --   PROT 6.1*  --   ALBUMIN 2.6* 2.5*    Recent Labs  02/21/16 0810 02/21/16 2032  WBC 8.3 6.7  HGB 9.2* 9.1*  HCT 31.2* 30.3*  MCV 93.4 95.0  PLT 332 313   No results for input(s): LABPROT, INR in the last 72 hours.    Assessment/Plan: - New onset of ascites in patient with the echo showing severe concentric hypertrophy, pulmonary hypertension and biventricular failure. Acetic fluid total protein is 4.SAAG 0.7. 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 - Culture negative nutrocytic ascites./SBP With PMNs of 3526. - Pericardial effusion - End-stage renal disease on dialysis -  Diabetes  Recommendations ------------------------ - Repeat fluid analysis again revealed PMN count of more than 250. - Recommend starting IV Rocephin if patient stays in the hospital or ciprofloxacin based on kidney function for secondary prophylaxis of SBP. Patient will require lifelong antibiotics prophylaxis - SAAG is 0.7 and Total protein of 4 -  non-portal hypertensive ascites. - GI will sign off. Call us back if needed   Otis Brace MD, [place] 02/22/2016, 9:28 AM  Pager [phone]  If no answer or after 5 PM call [phone]

## 2016-02-23 LAB — RENAL FUNCTION PANEL
ALBUMIN: 2.6 g/dL — AB (ref 3.5–5.0)
ANION GAP: 12 (ref 5–15)
BUN: 48 mg/dL — ABNORMAL HIGH (ref 6–20)
CHLORIDE: 94 mmol/L — AB (ref 101–111)
CO2: 28 mmol/L (ref 22–32)
Calcium: 9.9 mg/dL (ref 8.9–10.3)
Creatinine, Ser: 7.3 mg/dL — ABNORMAL HIGH (ref 0.44–1.00)
GFR calc Af Amer: 7 mL/min — ABNORMAL LOW (ref >60)
GFR calc non Af Amer: 6 mL/min — ABNORMAL LOW (ref >60)
GLUCOSE: 84 mg/dL (ref 65–99)
PHOSPHORUS: 7.9 mg/dL — AB (ref 2.5–4.6)
POTASSIUM: 5.1 mmol/L (ref 3.5–5.1)
Sodium: 134 mmol/L — ABNORMAL LOW (ref 135–145)

## 2016-02-23 LAB — GLUCOSE, CAPILLARY
GLUCOSE-CAPILLARY: 260 mg/dL — AB (ref 65–99)
GLUCOSE-CAPILLARY: 223 mg/dL — AB (ref 65–99)

## 2016-02-23 LAB — CBC
HEMATOCRIT: 30.2 % — AB (ref 36.0–46.0)
HEMOGLOBIN: 9.1 g/dL — AB (ref 12.0–15.0)
MCH: 28 pg (ref 26.0–34.0)
MCHC: 30.1 g/dL (ref 30.0–36.0)
MCV: 92.9 fL (ref 78.0–100.0)
Platelets: 297 10*3/uL (ref 150–400)
RBC: 3.25 MIL/uL — ABNORMAL LOW (ref 3.87–5.11)
RDW: 17.8 % — ABNORMAL HIGH (ref 11.5–15.5)
WBC: 7.4 10*3/uL (ref 4.0–10.5)

## 2016-02-23 LAB — PATHOLOGIST SMEAR REVIEW: Path Review: REACTIVE

## 2016-02-23 MED ORDER — TRAMADOL HCL 50 MG PO TABS
ORAL_TABLET | ORAL | Status: AC
  Administered 2016-02-23: 50 mg via ORAL
  Filled 2016-02-23: qty 1

## 2016-02-23 MED ORDER — ONDANSETRON HCL 4 MG/2ML IJ SOLN
INTRAMUSCULAR | Status: AC
  Administered 2016-02-23: 4 mg via INTRAVENOUS
  Filled 2016-02-23: qty 2

## 2016-02-23 MED ORDER — DOXERCALCIFEROL 4 MCG/2ML IV SOLN
INTRAVENOUS | Status: AC
  Administered 2016-02-23: 4 ug via INTRAVENOUS
  Filled 2016-02-23: qty 2

## 2016-02-23 MED ORDER — HYDRALAZINE HCL 50 MG PO TABS
100.0000 mg | ORAL_TABLET | Freq: Three times a day (TID) | ORAL | Status: DC
  Administered 2016-02-23 – 2016-02-24 (×2): 100 mg via ORAL
  Filled 2016-02-23 (×2): qty 2

## 2016-02-23 NOTE — Progress Notes (Signed)
[facility] Progress Note   Dialysis Orders: MWF NW  4h 69min   95.5kg  2/2 bath   Hep 3000  AVF Hect 6 ug Mircera 225 ug q 2wk last 9/20 Auryxia 2 tab ac and renvela 6 ac Sensipar 60/d - not taking Ca 10, P 7.2 Hb 9.3   Assessment/Plan: 1. New onset ascites - seen by cardiology and CHF team.  W/U in progress, ECHO showing severe pulm HTN, grade 3 diast dysfunction. Hx of prolonged severe HTN. ANA neg renal artery U/S without stenosis.  If high clinical suspicion for cardiac amyloid could get kappa and lambda serum light chains along with SPEP to see if there are any glaring abnormalities but ratio is difficult to interpret in setting of ESRD if it is slightly abnormal.  GI consulted, rec SBP prophylaxis 2. pulm HTN: s/p RHC 9/27 elevated PA pressures, PCWP 21, high output state. Will attempt vol removal as below for optimization, cardiology following.  Discussed with Dr. Haroldine Laws, banding/ ligation of fistula unlikely to improve hemodynamics.   3. HTN/ vol: even with being under EDW, BP's are up.  Has room for lowering dry wt further, challenging 9/29.  Large IDWG.  Not adhering to fluid restriction. 4. SBP - GS and peritoneal fluid cx's negative so far.  Cultures negative.  Gi following, will need SBP ppx as OP. 5. ESRD HD MWF- continuing to challenge wt 6. Anorexia/ wt loss- per primary, protein supplements, albumin 2.5 7. Anemia - no need for ESA, last mircera 225 dose 9/20, won't be due to 2 weeks hgb 9.2 - consistent with last outpt Hgb which has been trending down tsat 51% in August 8. 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  Objective Vitals:   02/23/16 0800 02/23/16 0830 02/23/16 0900 02/23/16 0930  BP: (!) 141/59 (!) 153/53 (!) 159/71 (!) 168/71  Pulse: 68 70 70 68  Resp:      Temp:      TempSrc:      SpO2:      Weight:      Height:       Physical Exam General: chronically ill-appearing, playing on phone Heart: RRR, occasional atrial kick Lungs: muffled at bases, improved from prior Abdomen: + fluid wave, distended, mildly tender Extremities: trace LE edema Dialysis Access:  L AVF + bruit and thrill    Additional Objective Labs: Basic Metabolic Panel:  Recent Labs Lab 02/21/16 0811 02/21/16 2032 02/22/16 1240 02/23/16 0653  NA 135  --  134* 134*  K 5.4*  --  4.7 5.1  CL 93*  --  94* 94*  CO2 24  --  27 28  GLUCOSE 87  --  58* 84  BUN 41*  --  33* 48*  CREATININE 7.27* 4.96* 5.85* 7.30*  CALCIUM 9.7  --  9.5 9.9  PHOS 7.5*  --  6.4* 7.9*   Liver Function Tests:  Recent Labs Lab 02/21/16 0454 02/21/16 0811 02/22/16 1240 02/23/16 0653  AST 12*  --   --   --   ALT 10*  --   --   --  ALKPHOS 173*  --   --   --   BILITOT 0.5  --   --   --   PROT 6.1*  --   --   --   ALBUMIN 2.6* 2.5* 2.7* 2.6*   No results for input(s): LIPASE, AMYLASE in the last 168 hours. CBC:  Recent Labs Lab 02/19/16 1002 02/21/16 0810 02/21/16 2032 02/22/16 1240 02/23/16 0653  WBC 9.7 8.3 6.7 8.5 7.4  HGB 9.2* 9.2* 9.1* 10.1* 9.1*  HCT 29.8* 31.2* 30.3* 32.7* 30.2*  MCV 91.4 93.4 95.0 93.4 92.9  PLT 320 332 313 382 297   Blood Culture    Component Value Date/Time   SDES FLUID PERITONEAL 02/21/2016 1627   SDES FLUID PERITONEAL 02/21/2016 1627   SPECREQUEST NONE 02/21/2016 1627   SPECREQUEST NONE 02/21/2016 1627   CULT NO GROWTH < 24 HOURS 02/21/2016 1627   REPTSTATUS 02/21/2016 FINAL 02/21/2016 1627   REPTSTATUS PENDING 02/21/2016 1627    CBG:  Recent Labs Lab 02/22/16 0733 02/22/16 1155 02/22/16 1523 02/22/16 1801 02/22/16 2123  GLUCAP 197* 67 136* 172* 130*    Lab Results  Component Value Date   INR 1.24 02/15/2016   INR 1.14 02/15/2015   INR 1.09 02/09/2015   Studies/Results: US Paracentesis  Result Date: 02/21/2016 INDICATION: End-stage renal disease on hemodialysis with heart failure. Ascites. Request for diagnostic and therapeutic paracentesis. EXAM: ULTRASOUND GUIDED DIAGNOSTIC AND THERAPEUTIC PARACENTESIS MEDICATIONS: 1% lidocaine COMPLICATIONS: None immediate. PROCEDURE: Informed written consent was obtained from the patient after a discussion of the risks, benefits and alternatives to treatment. A timeout was performed prior to the initiation of the procedure. Initial ultrasound scanning demonstrates a small amount of ascites within the left lower abdominal quadrant. The left lower abdomen was prepped and draped in the usual sterile fashion. 1% lidocaine was used for local anesthesia. Following this, a 19 gauge, 7-cm, Yueh catheter was introduced. An ultrasound image was saved for documentation purposes. The paracentesis was performed. The catheter was removed and a dressing was applied. The patient tolerated the procedure well without immediate post procedural complication. FINDINGS: A total of approximately 1.1 L of slightly cloudy yellow fluid was removed. Samples were sent to the laboratory as requested by the clinical team. IMPRESSION: Successful ultrasound-guided paracentesis yielding 1.1 liters of peritoneal fluid. Read by: Saverio Danker, PA-C Electronically Signed   By: Sandi Mariscal M.D.   On: 02/21/2016 16:24   Medications:   . amLODipine  10 mg Oral Daily  . atorvastatin  40 mg Oral q1800  . bisacodyl  10 mg Rectal Once  . cefTRIAXone (ROCEPHIN)  IV  2 g Intravenous Q24H  . cloNIDine  0.3 mg Oral TID  . clopidogrel  75 mg Oral QHS  . docusate sodium  100 mg Oral BID  . doxercalciferol      . doxercalciferol  4 mcg Intravenous Q M,W,F-HD  . heparin  5,000 Units Subcutaneous Q8H  . hydrALAZINE  75 mg Oral Q8H  . insulin aspart   0-9 Units Subcutaneous TID WC  . insulin aspart protamine- aspart  18 Units Subcutaneous Q breakfast  . insulin aspart protamine- aspart  23 Units Subcutaneous Q supper  . isosorbide mononitrate  120 mg Oral Daily  . labetalol  300 mg Oral TID  . senna  2 tablet Oral Daily  . sevelamer carbonate  3,200 mg Oral TID WC  . sodium chloride flush  3 mL Intravenous Q12H  . sodium chloride flush  3 mL Intravenous Q12H  Madelon Lips MD [facility] pgr [phone] cell [phone]

## 2016-02-23 NOTE — Discharge Summary (Signed)
Physician Discharge Summary  [name] [id number] DOB: May 31, 1975 DOA: 02/14/2016  PCP: Janet Seashore, MD  Admit date: 02/14/2016 Discharge date: 02/24/16  Admitted From: Home Disposition: Home  Recommendations for Outpatient Follow-up:  1. Follow up with PCP in 1-2 weeks 2. Please obtain BMP/CBC in one week 3. Please follow up on the following pending results:  Home Health:No Equipment/Devices:[name]  Discharge Condition:Stable CODE STATUS: FULL Diet recommendation: Renal  Brief/Interim Summary:  40 year old female with PMH of ESRD on MWF HD, CIN III s/p LEEP, CAD status post cardiac 2012, type II DM/IDDM, HLD, HTN, PAD,? Chronic CHF (follows with Dr. Einar [name]) presented to [facility] ED on 02/14/20 with gradually worsening abdominal distention, fluctuating appetite, approximately 20 pound weight loss since April 2017, no reported change in bowel habits, compliant with dialysis. In ED noted to have clinical ascites and CT abdomen with contrast revealed small to moderate ascites and pericardial effusion.Admitted for further evaluation. Underwent diagnostic and therapeutic paracentesis which showed significant neutrophilic leukocytosis concerning for SBP and hence treated with IV Rocephin but final culture negative-DC IV Rocephin. Cardiology/advanced heart failure team evaluated and suspect patient's ascites is from right-sided heart failure related to poorly controlled/resistant hypertension and restricted cardiomyopathy. Nephrology trying to reduce volume across dialysis. Slowly reaccumulating ascitic fluid and abdominal pain--> repeat paracentesis performed 02/22/2016 removing 1.1 L  Discharge Diagnoses:  Ascites - likely decompensated R-sided CHF - CT abdomen report as below was appreciated. Liver appears unremarkable & INR/1.24. -therapeutic paracentesis by IR 9/21: Ascitic fluid WBCs 4300, 82% neutrophils and 9% monocytes/macrophages, Gram stain negative, culture negative. Concern  for SBP>Started IV ceftriaxone and now that culture is negative, DC on 9/26. - Cardiology input appreciated: Suspect biventricular failure with acute on chronic diastolic heart failure, pulmonary hypertension with RV failure - Advanced heart failure team input appreciated: Control severe HTN suspect severe LVH related to hypertensive cardiomyopathy  -not candidate for Cardiac MRI,  - Nephrology follow-up appreciated.  -volume management across dialysis. -02/21/2016 and repeat paracentesis--1.1 L removed---SAAG--0.6--> NON-portal HTN ascites--> suggestive of cardiac cirrhosis  - Ascitic fluid cytology: No malignant cells, reactive mesothelial cells and abundant acute inflammation present. Cultures negative. - ANA-NEG. Acute hepatitis panel: Negative. - ? Abdominal pain d/t bowel edema from fluid overload--out of proportion with clinical exam - AppreciateEagle [name] --case discussed with [name] -restart ceftriaxone as repeat ascites cell count with WBC 1545 suggests SBP -Pt states she has no problems taking amoxicillin-->plan d/c with Augmentin for long term SBP prophlyaxis as pt already has well documented prolonged QTc--hesitated to use ciprofloxacin in the setting of documented chronic prolonged QTc -Home with amoxicillin/clavulanate for prophylaxis -home with amox/clav 500/125 mg daily x 1 week, then only on MWF after HD for life long prophylaxis  Pericardial effusion - Management as outlined in ascites. Repeat echo. No clinical features of cardiac tamponade. - As per cardiology, moderate size without hemodynamic compromise.  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  Uncontrolled type II  DM/IDDM -Check A1c: 11.2 suggesting very poor outpatient control. - CBGs  controlled over the last several days suggests Issues with compliance with all medications at home given good control of her diabetes in the hospital with stated home regimen. - Due to very tight control of CBGs, reduced insulin  -home with  prior to admission dose of 50/50 insulin  Essential hypertension - Uncontrolled. Continue with amlodipine, clonidine, hydralazine, labetalol and Imdur. When necessary IV hydralazine. -  As per discussion with her cardiologist, issues with compliance and persistently markedly elevated blood pressures. Attempt volume removal across dialysis and reassess blood pressure with her home regimen.  - As discussed with primary cardiologist, check renal artery Doppler--neg for RAS  Hyperlipidemia - Continue statins.  CAD - Continue Plavix, statins. Asymptomatic of chest pain.  Weight loss - Unclear etiology.? Related to ascites.  Anemia of CKD/Chronic disease - Hgb stable  Constipation - Despite home dose of Dulcolax 20 mg and Dulcolax suppository, no BM. Had BM after an enema on 9/25.   Discharge Instructions  Discharge Instructions    Diet - low sodium heart healthy    Complete by:  As directed    Increase activity slowly    Complete by:  As directed        Medication List    TAKE these medications   amLODipine 10 MG tablet Commonly known as:  NORVASC Take 1 tablet (10 mg total) by mouth daily.   amoxicillin-clavulanate 500-125 MG tablet Commonly known as:  AUGMENTIN Take 1 tablet (500 mg total) by mouth daily. X 7 days, then on Mon-Wed-Fri only after dialysis   atorvastatin 40 MG tablet Commonly known as:  LIPITOR Take 40 mg by mouth daily.   AURYXIA PO Take 2 tablets by mouth 3 (three) times daily with meals.   cloNIDine 0.3 MG tablet Commonly known as:  CATAPRES Take 1 tablet (0.3 mg total) by mouth 3 (three) times daily.   clopidogrel 75 MG  tablet Commonly known as:  PLAVIX Take 75 mg by mouth at bedtime.   HUMALOG MIX 50/50 KWIKPEN (50-50) 100 UNIT/ML Kwikpen Generic drug:  Insulin Lispro Prot & Lispro Inject 26-28 Units into the skin 2 (two) times daily. 28 in the morning and 26 in the evening.   hydrALAZINE 100 MG tablet Commonly known as:  APRESOLINE Take 1 tablet (100 mg total) by mouth every 8 (eight) hours. What changed:  medication strength  how much to take   isosorbide mononitrate 120 MG 24 hr tablet Commonly known as:  IMDUR Take 1 tablet (120 mg total) by mouth daily. Start taking on:  02/25/2016 What changed:  medication strength  how much to take   labetalol 300 MG tablet Commonly known as:  NORMODYNE Take 1 tablet (300 mg total) by mouth 3 (three) times daily.   RENVELA 800 MG tablet Generic drug:  sevelamer carbonate Take 4,800 mg by mouth 3 (three) times daily with meals.   traMADol 50 MG tablet Commonly known as:  ULTRAM Take 1 tablet (50 mg total) by mouth every 6 (six) hours as needed for moderate pain (or Headache unrelieved by tylenol).       Allergies  Allergen Reactions  . Cephalexin Other (See Comments)    Reaction unknown  . Sulfamethoxazole-Trimethoprim Other (See Comments)    Unknown reaction. Pt states that she was told by her mother that she had allergy to Bactrim as a child.    Consultations:  Nephrology  Cardiology  Heart Failure   Procedures/Studies: Dg Chest 2 View  Result Date: 02/15/2016 CLINICAL DATA:  Patient reports fluid in the abdomen. History of CAD, hypertension, diabetes. History of dialysis. EXAM: CHEST  2 VIEW COMPARISON:  10/25/2015 FINDINGS: The heart is enlarged and stable in configuration. There are no focal consolidations or pleural effusions. No pulmonary edema.  IMPRESSION: Stable cardiomegaly. Electronically Signed   By: Nolon Nations M.D.   On: 02/15/2016 13:14   Ct Abdomen Pelvis W Contrast  Result Date: 02/15/2016 CLINICAL DATA:   Acute onset of generalized abdominal distention, nausea and vomiting. Abdominal pain. Initial encounter. EXAM: CT ABDOMEN AND PELVIS WITH CONTRAST TECHNIQUE: Multidetector CT imaging of the abdomen and pelvis was performed using the standard protocol following bolus administration of intravenous contrast. CONTRAST:  100 mL ISOVUE-300 IOPAMIDOL (ISOVUE-300) INJECTION 61% COMPARISON:  MRI of the lumbar spine performed 11/02/2015, and CT of the abdomen and pelvis from 05/24/2014 FINDINGS: Lower chest: A small to moderate pericardial effusion is noted. Diffuse coronary artery calcifications are seen. The visualized lung bases appear grossly clear. Hepatobiliary: The liver is grossly unremarkable in appearance. The gallbladder is grossly unremarkable, though difficult to fully assess given adjacent ascites. The common bile duct remains normal in caliber. Pancreas: The pancreas is within normal limits. Spleen: The spleen is unremarkable in appearance. Adrenals/Urinary Tract: The adrenal glands are unremarkable in appearance. There is moderate renal atrophy bilaterally, with scattered small bilateral renal cysts and underlying vascular calcifications at both renal hila. There is no evidence of hydronephrosis. Stomach/Bowel: The stomach is unremarkable in appearance. The small bowel is within normal limits. The appendix is normal in caliber, without evidence of appendicitis. The colon is unremarkable in appearance. Vascular/Lymphatic: Relatively diffuse calcification is seen along the abdominal aorta and its branches. The inferior vena cava is grossly unremarkable. No pelvic sidewall lymphadenopathy is seen. There is no evidence of retroperitoneal or inguinal lymphadenopathy. Reproductive: The bladder is mildly distended and within normal limits. The uterus is grossly unremarkable in appearance. The ovaries are relatively symmetric. No suspicious adnexal masses are seen. Other: Small to moderate volume ascites is seen  within the abdomen and pelvis. No additional soft tissue abnormalities are seen. Musculoskeletal: No acute osseous abnormalities are identified. The visualized musculature is unremarkable in appearance. IMPRESSION: 1. Small to moderate volume ascites noted within the abdomen and pelvis. 2. Small to moderate pericardial effusion seen. 3. Diffuse coronary artery calcifications noted. 4. Aortic atherosclerosis. 5. Moderate bilateral renal atrophy, with scattered small bilateral renal cysts. Electronically Signed   By: Garald Balding M.D.   On: 02/15/2016 02:54   US Paracentesis  Result Date: 02/21/2016 INDICATION: End-stage renal disease on hemodialysis with heart failure. Ascites. Request for diagnostic and therapeutic paracentesis. EXAM: ULTRASOUND GUIDED DIAGNOSTIC AND THERAPEUTIC PARACENTESIS MEDICATIONS: 1% lidocaine COMPLICATIONS: None immediate. PROCEDURE: Informed written consent was obtained from the patient after a discussion of the risks, benefits and alternatives to treatment. A timeout was performed prior to the initiation of the procedure. Initial ultrasound scanning demonstrates a small amount of ascites within the left lower abdominal quadrant. The left lower abdomen was prepped and draped in the usual sterile fashion. 1% lidocaine was used for local anesthesia. Following this, a 19 gauge, 7-cm, Yueh catheter was introduced. An ultrasound image was saved for documentation purposes. The paracentesis was performed. The catheter was removed and a dressing was applied. The patient tolerated the procedure well without immediate post procedural complication. FINDINGS: A total of approximately 1.1 L of slightly cloudy yellow fluid was removed. Samples were sent to the laboratory as requested by the clinical team. IMPRESSION: Successful ultrasound-guided paracentesis yielding 1.1 liters of peritoneal fluid. Read by: Saverio Danker, PA-C Electronically Signed   By: Sandi Mariscal M.D.   On: 02/21/2016 16:24    US Paracentesis  Result Date: 02/15/2016 INDICATION: End stage renal failure on hemodialysis.  Ascites. Request for diagnostic and therapeutic paracentesis. EXAM: ULTRASOUND GUIDED RIGHT LATERAL ABDOMEN PARACENTESIS MEDICATIONS: 1% Lidocaine. COMPLICATIONS: None immediate. PROCEDURE: Informed written consent was obtained from the patient after a discussion of the risks, benefits and alternatives to treatment. A timeout was performed prior to the initiation of the procedure. Initial ultrasound scanning demonstrates a moderate amount of ascites within the right lateral abdomen. The right lateral abdomen was prepped and draped in the usual sterile fashion. 1% lidocaine with epinephrine was used for local anesthesia. Following this, a 19 gauge, 7-cm, Yueh catheter was introduced. An ultrasound image was saved for documentation purposes. The paracentesis was performed. The catheter was removed and a dressing was applied. The patient tolerated the procedure well without immediate post procedural complication. FINDINGS: A total of approximately 3.4 liters of clear light orange fluid was removed. Samples were sent to the laboratory as requested by the clinical team. IMPRESSION: Successful ultrasound-guided paracentesis yielding 3.4 liters of peritoneal fluid. Read by:  Gareth Eagle, PA-C Electronically Signed   By: Corrie Mckusick D.O.   On: 02/15/2016 13:09        Discharge Exam: Vitals:   02/24/16 1112 02/24/16 1259  BP: (!) 179/85 (!) 195/75  Pulse: 69 68  Resp: 18 20  Temp: 98 F (36.7 C) 98.2 F (36.8 C)   Vitals:   02/24/16 1030 02/24/16 1100 02/24/16 1112 02/24/16 1259  BP: (!) 176/77 (!) 175/79 (!) 179/85 (!) 195/75  Pulse: 71 70 69 68  Resp:   18 20  Temp:   98 F (36.7 C) 98.2 F (36.8 C)  TempSrc:   Oral Oral  SpO2:   98% 95%  Weight:      Height:        General: Pt is alert, awake, not in acute distress Cardiovascular: RRR, S1/S2 +, no rubs, no gallops Respiratory: CTA  bilaterally, no wheezing, no rhonchi Abdominal: Soft, NT, ND, bowel sounds + Extremities: no edema, no cyanosis   The results of significant diagnostics from this hospitalization (including imaging, microbiology, ancillary and laboratory) are listed below for reference.    Significant Diagnostic Studies: Dg Chest 2 View  Result Date: 02/15/2016 CLINICAL DATA:  Patient reports fluid in the abdomen. History of CAD, hypertension, diabetes. History of dialysis. EXAM: CHEST  2 VIEW COMPARISON:  10/25/2015 FINDINGS: The heart is enlarged and stable in configuration. There are no focal consolidations or pleural effusions. No pulmonary edema. IMPRESSION: Stable cardiomegaly. Electronically Signed   By: Nolon Nations M.D.   On: 02/15/2016 13:14   Ct Abdomen Pelvis W Contrast  Result Date: 02/15/2016 CLINICAL DATA:  Acute onset of generalized abdominal distention, nausea and vomiting. Abdominal pain. Initial encounter. EXAM: CT ABDOMEN AND PELVIS WITH CONTRAST TECHNIQUE: Multidetector CT imaging of the abdomen and pelvis was performed using the standard protocol following bolus administration of intravenous contrast. CONTRAST:  100 mL ISOVUE-300 IOPAMIDOL (ISOVUE-300) INJECTION 61% COMPARISON:  MRI of the lumbar spine performed 11/02/2015, and CT of the abdomen and pelvis from 05/24/2014 FINDINGS: Lower chest: A small to moderate pericardial effusion is noted. Diffuse coronary artery calcifications are seen. The visualized lung bases appear grossly clear. Hepatobiliary: The liver is grossly unremarkable in appearance. The gallbladder is grossly unremarkable, though difficult to fully assess given adjacent ascites. The common bile duct remains normal in caliber. Pancreas: The pancreas is within normal limits. Spleen: The spleen is unremarkable in appearance. Adrenals/Urinary Tract: The adrenal glands are unremarkable in appearance. There is moderate renal atrophy bilaterally, with scattered small  bilateral  renal cysts and underlying vascular calcifications at both renal hila. There is no evidence of hydronephrosis. Stomach/Bowel: The stomach is unremarkable in appearance. The small bowel is within normal limits. The appendix is normal in caliber, without evidence of appendicitis. The colon is unremarkable in appearance. Vascular/Lymphatic: Relatively diffuse calcification is seen along the abdominal aorta and its branches. The inferior vena cava is grossly unremarkable. No pelvic sidewall lymphadenopathy is seen. There is no evidence of retroperitoneal or inguinal lymphadenopathy. Reproductive: The bladder is mildly distended and within normal limits. The uterus is grossly unremarkable in appearance. The ovaries are relatively symmetric. No suspicious adnexal masses are seen. Other: Small to moderate volume ascites is seen within the abdomen and pelvis. No additional soft tissue abnormalities are seen. Musculoskeletal: No acute osseous abnormalities are identified. The visualized musculature is unremarkable in appearance. IMPRESSION: 1. Small to moderate volume ascites noted within the abdomen and pelvis. 2. Small to moderate pericardial effusion seen. 3. Diffuse coronary artery calcifications noted. 4. Aortic atherosclerosis. 5. Moderate bilateral renal atrophy, with scattered small bilateral renal cysts. Electronically Signed   By: Garald Balding M.D.   On: 02/15/2016 02:54   US Paracentesis  Result Date: 02/21/2016 INDICATION: End-stage renal disease on hemodialysis with heart failure. Ascites. Request for diagnostic and therapeutic paracentesis. EXAM: ULTRASOUND GUIDED DIAGNOSTIC AND THERAPEUTIC PARACENTESIS MEDICATIONS: 1% lidocaine COMPLICATIONS: None immediate. PROCEDURE: Informed written consent was obtained from the patient after a discussion of the risks, benefits and alternatives to treatment. A timeout was performed prior to the initiation of the procedure. Initial ultrasound scanning demonstrates a  small amount of ascites within the left lower abdominal quadrant. The left lower abdomen was prepped and draped in the usual sterile fashion. 1% lidocaine was used for local anesthesia. Following this, a 19 gauge, 7-cm, Yueh catheter was introduced. An ultrasound image was saved for documentation purposes. The paracentesis was performed. The catheter was removed and a dressing was applied. The patient tolerated the procedure well without immediate post procedural complication. FINDINGS: A total of approximately 1.1 L of slightly cloudy yellow fluid was removed. Samples were sent to the laboratory as requested by the clinical team. IMPRESSION: Successful ultrasound-guided paracentesis yielding 1.1 liters of peritoneal fluid. Read by: Saverio Danker, PA-C Electronically Signed   By: Sandi Mariscal M.D.   On: 02/21/2016 16:24   US Paracentesis  Result Date: 02/15/2016 INDICATION: End stage renal failure on hemodialysis. Ascites. Request for diagnostic and therapeutic paracentesis. EXAM: ULTRASOUND GUIDED RIGHT LATERAL ABDOMEN PARACENTESIS MEDICATIONS: 1% Lidocaine. COMPLICATIONS: None immediate. PROCEDURE: Informed written consent was obtained from the patient after a discussion of the risks, benefits and alternatives to treatment. A timeout was performed prior to the initiation of the procedure. Initial ultrasound scanning demonstrates a moderate amount of ascites within the right lateral abdomen. The right lateral abdomen was prepped and draped in the usual sterile fashion. 1% lidocaine with epinephrine was used for local anesthesia. Following this, a 19 gauge, 7-cm, Yueh catheter was introduced. An ultrasound image was saved for documentation purposes. The paracentesis was performed. The catheter was removed and a dressing was applied. The patient tolerated the procedure well without immediate post procedural complication. FINDINGS: A total of approximately 3.4 liters of clear light orange fluid was removed. Samples  were sent to the laboratory as requested by the clinical team. IMPRESSION: Successful ultrasound-guided paracentesis yielding 3.4 liters of peritoneal fluid. Read by:  Gareth Eagle, PA-C Electronically Signed   By: Corrie Mckusick D.O.   On: 02/15/2016  13:09     Microbiology: Recent Results (from the past 240 hour(s))  Culture, body fluid-bottle     Status: None   Collection Time: 02/15/16  1:41 PM  Result Value Ref Range Status   Specimen Description FLUID ABDOMEN PERITONEAL  Final   Special Requests NONE  Final   Culture NO GROWTH 5 DAYS  Final   Report Status 02/20/2016 FINAL  Final  Gram stain     Status: None   Collection Time: 02/15/16  1:41 PM  Result Value Ref Range Status   Specimen Description FLUID ABDOMEN PERITONEAL  Final   Special Requests NONE  Final   Gram Stain   Final    RARE WBC PRESENT, PREDOMINANTLY PMN NO ORGANISMS SEEN    Report Status 02/15/2016 FINAL  Final  MRSA PCR Screening     Status: None   Collection Time: 02/16/16  7:25 AM  Result Value Ref Range Status   MRSA by PCR NEGATIVE NEGATIVE Final    Comment:        The GeneXpert MRSA Assay (FDA approved for NASAL specimens only), is one component of a comprehensive MRSA colonization surveillance program. It is not intended to diagnose MRSA infection nor to guide or monitor treatment for MRSA infections.   Gram stain     Status: None   Collection Time: 02/21/16  4:27 PM  Result Value Ref Range Status   Specimen Description FLUID PERITONEAL  Final   Special Requests NONE  Final   Gram Stain   Final    RARE WBC PRESENT, PREDOMINANTLY PMN NO ORGANISMS SEEN    Report Status 02/21/2016 FINAL  Final  Culture, body fluid-bottle     Status: None (Preliminary result)   Collection Time: 02/21/16  4:27 PM  Result Value Ref Range Status   Specimen Description FLUID PERITONEAL  Final   Special Requests NONE  Final   Gram Stain   Final    RARE WBC PRESENT, PREDOMINANTLY PMN NO ORGANISMS SEEN    Culture  NO GROWTH 3 DAYS  Final   Report Status PENDING  Incomplete     Labs: Basic Metabolic Panel:  Recent Labs Lab 02/19/16 0947 02/21/16 0454 02/21/16 0811 02/21/16 2032 02/22/16 1240 02/23/16 0653 02/24/16 0743  NA 132* 135 135  --  134* 134* 130*  K 5.1 5.2* 5.4*  --  4.7 5.1 5.0  CL 92* 93* 93*  --  94* 94* 93*  CO2 26 30 24   --  27 28 25   GLUCOSE 143* 118* 87  --  58* 84 168*  BUN 50* 41* 41*  --  33* 48* 32*  CREATININE 7.54* 7.23* 7.27* 4.96* 5.85* 7.30* 4.72*  CALCIUM 9.6 9.8 9.7  --  9.5 9.9 9.0  PHOS 7.1*  --  7.5*  --  6.4* 7.9* 6.2*   Liver Function Tests:  Recent Labs Lab 02/21/16 0454 02/21/16 0811 02/22/16 1240 02/23/16 0653 02/24/16 0743  AST 12*  --   --   --   --   ALT 10*  --   --   --   --   ALKPHOS 173*  --   --   --   --   BILITOT 0.5  --   --   --   --   PROT 6.1*  --   --   --   --   ALBUMIN 2.6* 2.5* 2.7* 2.6* 2.6*   No results for input(s): LIPASE, AMYLASE in the last 168 hours. No results  for input(s): AMMONIA in the last 168 hours. CBC:  Recent Labs Lab 02/21/16 0810 02/21/16 2032 02/22/16 1240 02/23/16 0653 02/24/16 0743  WBC 8.3 6.7 8.5 7.4 7.6  HGB 9.2* 9.1* 10.1* 9.1* 8.8*  HCT 31.2* 30.3* 32.7* 30.2* 29.1*  MCV 93.4 95.0 93.4 92.9 93.0  PLT 332 313 382 297 294   Cardiac Enzymes: No results for input(s): CKTOTAL, CKMB, CKMBINDEX, TROPONINI in the last 168 hours. BNP: Invalid input(s): POCBNP CBG:  Recent Labs Lab 02/24/16 0251 02/24/16 0325 02/24/16 0408 02/24/16 0640 02/24/16 1307  GLUCAP 131* 106* 120* 132* 213*    Time coordinating discharge:  Greater than 30 minutes  Signed:  Franco Duley, DO [facility] Pager: [phone] 02/24/2016, 4:11 PM

## 2016-02-23 NOTE — Progress Notes (Signed)
PROGRESS NOTE  [name] [id number] DOB: August 27, 1975 DOA: 02/14/2016 PCP: Merrilee Seashore, MD  Brief History:  40 year old female with PMH of ESRD on MWF HD, CIN III s/p LEEP, CAD status post cardiac 2012, type II DM/IDDM, HLD, HTN, PAD,? Chronic CHF (follows with Dr. Einar Gip) presented to [facility] ED on 02/14/20 with gradually worsening abdominal distention, fluctuating appetite, approximately 20 pound weight loss since April 2017, no reported change in bowel habits, compliant with dialysis. In ED noted to have clinical ascites and CT abdomen with contrast revealed small to moderate ascites and pericardial effusion.Admitted for further evaluation. Underwent diagnostic and therapeutic paracentesis which showed significant neutrophilic leukocytosis concerning for SBP and hence treated with IV Rocephin but final culture negative-DC IV Rocephin. Cardiology/advanced heart failure team evaluated and suspect patient's ascites is from right-sided heart failure related to poorly controlled/resistant hypertension and restricted cardiomyopathy. Nephrology trying to reduce volume across dialysis. Slowly reaccumulating ascitic fluid and abdominal pain--> repeat paracentesis performed 02/22/2016 removing 1.1 L   Assessment/Plan: Ascites - likely decompensated R-sided CHF - CT abdomen report as below was appreciated. Liver appears unremarkable & INR/1.24. -therapeutic paracentesis by IR 9/21: Ascitic fluid WBCs 4300, 82% neutrophils and 9% monocytes/macrophages, Gram stain negative, culture negative. Concern for SBP>Started IV ceftriaxone and now that culture is negative, DC on 9/26. - Cardiology input appreciated: Suspect biventricular failure with acute on chronic diastolic heart failure, pulmonary hypertension with RV failure - Advanced heart failure team input appreciated: Control severe HTN suspect severe LVH related to hypertensive cardiomyopathy  -not candidate for C MRI,  - Nephrology  follow-up appreciated.  -volume management with dialysis. 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 Cultures negative. - ANA-NEG. Acute hepatitis panel: Negative. - ? Abdominal pain d/t bowel edema from fluid overload--out of proportion with clinical exam - [facility] --case discussed with Dr. Rosalie Gums -restart ceftriaxone as repeat ascites cell count with WBC 1545 suggests SBP -Pt states she has no problems taking amoxicillin-->plan d/c with Augmentin for long term SBP prophlyaxis as pt already has well documented prolonged QTc--hesitate to use cipro  Pericardial effusion - Management as outlined in ascites. Repeat echo. No clinical features of cardiac tamponade. - As per cardiology, moderate size without hemodynamic compromise.  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 -poor compliance with diet and fluid restrict -02/23/16--eating [employer] in room  Uncontrolled type II DM/IDDM -Check A1c: 11.2 suggesting very poor outpatient control. - CBGs reasonably controlled over the last couple days.? Issues with compliance with all medications at home given good control of her diabetes in the hospital with stated home regimen. - Due to very tight control of CBGs, reduced insulin on 9/28  Essential hypertension - Uncontrolled. Continue with amlodipine, clonidine, hydralazine, labetalol and Imdur. When necessary IV hydralazine. -  As per discussion with her cardiologist, issues with compliance and  persistently markedly elevated blood pressures. Attempt volume removal across dialysis and reassess blood pressure with her home regimen.  - As discussed with primary cardiologist, check renal artery Doppler--neg for RAS -increase hydralazine to 100 mg tid  Hyperlipidemia - Continue statins.  CAD -  Continue Plavix, statins. Asymptomatic of chest pain.  Anemia of CKD/Chronic disease - Hgb stable  Constipation - Despite home dose of Dulcolax 20 mg and Dulcolax suppository, no BM. Had BM after an enema on 9/25.  DVT prophylaxis:Heparin Code Status:Full Family Communication: spouse updated at bedside 9/29  Disposition Plan:DC home 02/24/16 if cleared by nephrology   Consultants:  Nephrology  IR  Cardiology  Advanced heart failure team.  Procedures:  HD  3.4 L paracentesis by IR on 9/21.  1.1 L paracentesis on 9/27  Antimicrobials:  Ceftriaxone 9/22-->9/26  Ceftriaxone 9/28-->            Subjective: Overall abdominal pain is somewhat better. Denies any fevers, chills, chest pain or shortness breath, nausea, vomiting. No bowel movement in the last 4 days.  Objective: Vitals:   02/23/16 1000 02/23/16 1030 02/23/16 1100 02/23/16 1105  BP: (!) 171/80 (!) 158/63 (!) 180/79 (!) 177/75  Pulse: 69 66 73 73  Resp:      Temp:    97.7 F (36.5 C)  TempSrc:    Oral  SpO2:    94%  Weight:    88 kg (194 lb 0.1 oz)  Height:        Intake/Output Summary (Last 24 hours) at 02/23/16 1225 Last data filed at 02/23/16 1105  Gross per 24 hour  Intake                3 ml  Output             4000 ml  Net            -3997 ml   Weight change: -2.8 kg (-6 lb 2.8 oz) Exam:   General:  Pt is alert, follows commands appropriately, not in acute distress  HEENT: No icterus, No thrush, No neck mass, [place]/AT  Cardiovascular: RRR, S1/S2, no rubs, no gallops  Respiratory: CTA bilaterally, no wheezing, no crackles, no rhonchi  Abdomen: Soft/+BS, non  tender, non distended, no guarding  Extremities: No edema, No lymphangitis, No petechiae, No rashes, no synovitis   Data Reviewed: I have personally reviewed following labs and imaging studies Basic Metabolic Panel:  Recent Labs Lab 02/18/16 0535 02/19/16 0947 02/21/16 0454 02/21/16 0811 02/21/16 2032 02/22/16 1240 02/23/16 0653  NA 135 132* 135 135  --  134* 134*  K 4.4 5.1 5.2* 5.4*  --  4.7 5.1  CL 93* 92* 93* 93*  --  94* 94*  CO2 30 26 30 24   --  27 28  GLUCOSE 164* 143* 118* 87  --  58* 84  BUN 40* 50* 41* 41*  --  33* 48*  CREATININE 6.31* 7.54* 7.23* 7.27* 4.96* 5.85* 7.30*  CALCIUM 9.3 9.6 9.8 9.7  --  9.5 9.9  PHOS 6.8* 7.1*  --  7.5*  --  6.4* 7.9*   Liver Function Tests:  Recent Labs Lab 02/19/16 0947 02/21/16 0454 02/21/16 0811 02/22/16 1240 02/23/16 0653  AST  --  12*  --   --   --   ALT  --  10*  --   --   --   ALKPHOS  --  173*  --   --   --   BILITOT  --  0.5  --   --   --   PROT  --  6.1*  --   --   --   ALBUMIN 2.5* 2.6* 2.5* 2.7* 2.6*   No results for input(s): LIPASE, AMYLASE in the last  168 hours. No results for input(s): AMMONIA in the last 168 hours. Coagulation Profile: No results for input(s): INR, PROTIME in the last 168 hours. CBC:  Recent Labs Lab 02/19/16 1002 02/21/16 0810 02/21/16 2032 02/22/16 1240 02/23/16 0653  WBC 9.7 8.3 6.7 8.5 7.4  HGB 9.2* 9.2* 9.1* 10.1* 9.1*  HCT 29.8* 31.2* 30.3* 32.7* 30.2*  MCV 91.4 93.4 95.0 93.4 92.9  PLT 320 332 313 382 297   Cardiac Enzymes: No results for input(s): CKTOTAL, CKMB, CKMBINDEX, TROPONINI in the last 168 hours. BNP: Invalid input(s): POCBNP CBG:  Recent Labs Lab 02/22/16 0733 02/22/16 1155 02/22/16 1523 02/22/16 1801 02/22/16 2123  GLUCAP 197* 67 136* 172* 130*   HbA1C: No results for input(s): HGBA1C in the last 72 hours. Urine analysis:    Component Value Date/Time   COLORURINE YELLOW 02/08/2011 1812   APPEARANCEUR CLEAR 02/08/2011 1812   LABSPEC 1.016  02/08/2011 1812   PHURINE 8.0 02/08/2011 1812   GLUCOSEU 250 (A) 02/08/2011 1812   HGBUR NEGATIVE 02/08/2011 1812   BILIRUBINUR NEGATIVE 02/08/2011 1812   KETONESUR NEGATIVE 02/08/2011 1812   PROTEINUR >300 (A) 02/08/2011 1812   UROBILINOGEN 0.2 02/08/2011 1812   NITRITE NEGATIVE 02/08/2011 1812   LEUKOCYTESUR NEGATIVE 02/08/2011 1812   Sepsis Labs: @LABRCNTIP (procalcitonin:4,lacticidven:4) ) Recent Results (from the past 240 hour(s))  Culture, body fluid-bottle     Status: None   Collection Time: 02/15/16  1:41 PM  Result Value Ref Range Status   Specimen Description FLUID ABDOMEN PERITONEAL  Final   Special Requests NONE  Final   Culture NO GROWTH 5 DAYS  Final   Report Status 02/20/2016 FINAL  Final  Gram stain     Status: None   Collection Time: 02/15/16  1:41 PM  Result Value Ref Range Status   Specimen Description FLUID ABDOMEN PERITONEAL  Final   Special Requests NONE  Final   Gram Stain   Final    RARE WBC PRESENT, PREDOMINANTLY PMN NO ORGANISMS SEEN    Report Status 02/15/2016 FINAL  Final  MRSA PCR Screening     Status: None   Collection Time: 02/16/16  7:25 AM  Result Value Ref Range Status   MRSA by PCR NEGATIVE NEGATIVE Final    Comment:        The GeneXpert MRSA Assay (FDA approved for NASAL specimens only), is one component of a comprehensive MRSA colonization surveillance program. It is not intended to diagnose MRSA infection nor to guide or monitor treatment for MRSA infections.   Gram stain     Status: None   Collection Time: 02/21/16  4:27 PM  Result Value Ref Range Status   Specimen Description FLUID PERITONEAL  Final   Special Requests NONE  Final   Gram Stain   Final    RARE WBC PRESENT, PREDOMINANTLY PMN NO ORGANISMS SEEN    Report Status 02/21/2016 FINAL  Final  Culture, body fluid-bottle     Status: None (Preliminary result)   Collection Time: 02/21/16  4:27 PM  Result Value Ref Range Status   Specimen Description FLUID PERITONEAL   Final   Special Requests NONE  Final   Gram Stain   Final    RARE WBC PRESENT, PREDOMINANTLY PMN NO ORGANISMS SEEN    Culture NO GROWTH < 24 HOURS  Final   Report Status PENDING  Incomplete     Scheduled Meds: . amLODipine  10 mg Oral Daily  . atorvastatin  40 mg Oral q1800  . bisacodyl  10  mg Rectal Once  . cefTRIAXone (ROCEPHIN)  IV  2 g Intravenous Q24H  . cloNIDine  0.3 mg Oral TID  . clopidogrel  75 mg Oral QHS  . docusate sodium  100 mg Oral BID  . doxercalciferol  4 mcg Intravenous Q M,W,F-HD  . heparin  5,000 Units Subcutaneous Q8H  . hydrALAZINE  75 mg Oral Q8H  . insulin aspart  0-9 Units Subcutaneous TID WC  . insulin aspart protamine- aspart  18 Units Subcutaneous Q breakfast  . insulin aspart protamine- aspart  23 Units Subcutaneous Q supper  . isosorbide mononitrate  120 mg Oral Daily  . labetalol  300 mg Oral TID  . senna  2 tablet Oral Daily  . sevelamer carbonate  3,200 mg Oral TID WC  . sodium chloride flush  3 mL Intravenous Q12H  . sodium chloride flush  3 mL Intravenous Q12H   Continuous Infusions:   Procedures/Studies: Dg Chest 2 View  Result Date: 02/15/2016 CLINICAL DATA:  Patient reports fluid in the abdomen. History of CAD, hypertension, diabetes. History of dialysis. EXAM: CHEST  2 VIEW COMPARISON:  10/25/2015 FINDINGS: The heart is enlarged and stable in configuration. There are no focal consolidations or pleural effusions. No pulmonary edema. IMPRESSION: Stable cardiomegaly. Electronically Signed   By: Nolon Nations M.D.   On: 02/15/2016 13:14   Ct Abdomen Pelvis W Contrast  Result Date: 02/15/2016 CLINICAL DATA:  Acute onset of generalized abdominal distention, nausea and vomiting. Abdominal pain. Initial encounter. EXAM: CT ABDOMEN AND PELVIS WITH CONTRAST TECHNIQUE: Multidetector CT imaging of the abdomen and pelvis was performed using the standard protocol following bolus administration of intravenous contrast. CONTRAST:  100 mL ISOVUE-300  IOPAMIDOL (ISOVUE-300) INJECTION 61% COMPARISON:  MRI of the lumbar spine performed 11/02/2015, and CT of the abdomen and pelvis from 05/24/2014 FINDINGS: Lower chest: A small to moderate pericardial effusion is noted. Diffuse coronary artery calcifications are seen. The visualized lung bases appear grossly clear. Hepatobiliary: The liver is grossly unremarkable in appearance. The gallbladder is grossly unremarkable, though difficult to fully assess given adjacent ascites. The common bile duct remains normal in caliber. Pancreas: The pancreas is within normal limits. Spleen: The spleen is unremarkable in appearance. Adrenals/Urinary Tract: The adrenal glands are unremarkable in appearance. There is moderate renal atrophy bilaterally, with scattered small bilateral renal cysts and underlying vascular calcifications at both renal hila. There is no evidence of hydronephrosis. Stomach/Bowel: The stomach is unremarkable in appearance. The small bowel is within normal limits. The appendix is normal in caliber, without evidence of appendicitis. The colon is unremarkable in appearance. Vascular/Lymphatic: Relatively diffuse calcification is seen along the abdominal aorta and its branches. The inferior vena cava is grossly unremarkable. No pelvic sidewall lymphadenopathy is seen. There is no evidence of retroperitoneal or inguinal lymphadenopathy. Reproductive: The bladder is mildly distended and within normal limits. The uterus is grossly unremarkable in appearance. The ovaries are relatively symmetric. No suspicious adnexal masses are seen. Other: Small to moderate volume ascites is seen within the abdomen and pelvis. No additional soft tissue abnormalities are seen. Musculoskeletal: No acute osseous abnormalities are identified. The visualized musculature is unremarkable in appearance. IMPRESSION: 1. Small to moderate volume ascites noted within the abdomen and pelvis. 2. Small to moderate pericardial effusion seen. 3.  Diffuse coronary artery calcifications noted. 4. Aortic atherosclerosis. 5. Moderate bilateral renal atrophy, with scattered small bilateral renal cysts. Electronically Signed   By: Garald Balding M.D.   On: 02/15/2016 02:54   US  Paracentesis  Result Date: 02/21/2016 INDICATION: End-stage renal disease on hemodialysis with heart failure. Ascites. Request for diagnostic and therapeutic paracentesis. EXAM: ULTRASOUND GUIDED DIAGNOSTIC AND THERAPEUTIC PARACENTESIS MEDICATIONS: 1% lidocaine COMPLICATIONS: None immediate. PROCEDURE: Informed written consent was obtained from the patient after a discussion of the risks, benefits and alternatives to treatment. A timeout was performed prior to the initiation of the procedure. Initial ultrasound scanning demonstrates a small amount of ascites within the left lower abdominal quadrant. The left lower abdomen was prepped and draped in the usual sterile fashion. 1% lidocaine was used for local anesthesia. Following this, a 19 gauge, 7-cm, Yueh catheter was introduced. An ultrasound image was saved for documentation purposes. The paracentesis was performed. The catheter was removed and a dressing was applied. The patient tolerated the procedure well without immediate post procedural complication. FINDINGS: A total of approximately 1.1 L of slightly cloudy yellow fluid was removed. Samples were sent to the laboratory as requested by the clinical team. IMPRESSION: Successful ultrasound-guided paracentesis yielding 1.1 liters of peritoneal fluid. Read by: Saverio Danker, PA-C Electronically Signed   By: Sandi Mariscal M.D.   On: 02/21/2016 16:24   US Paracentesis  Result Date: 02/15/2016 INDICATION: End stage renal failure on hemodialysis. Ascites. Request for diagnostic and therapeutic paracentesis. EXAM: ULTRASOUND GUIDED RIGHT LATERAL ABDOMEN PARACENTESIS MEDICATIONS: 1% Lidocaine. COMPLICATIONS: None immediate. PROCEDURE: Informed written consent was obtained from the  patient after a discussion of the risks, benefits and alternatives to treatment. A timeout was performed prior to the initiation of the procedure. Initial ultrasound scanning demonstrates a moderate amount of ascites within the right lateral abdomen. The right lateral abdomen was prepped and draped in the usual sterile fashion. 1% lidocaine with epinephrine was used for local anesthesia. Following this, a 19 gauge, 7-cm, Yueh catheter was introduced. An ultrasound image was saved for documentation purposes. The paracentesis was performed. The catheter was removed and a dressing was applied. The patient tolerated the procedure well without immediate post procedural complication. FINDINGS: A total of approximately 3.4 liters of clear light orange fluid was removed. Samples were sent to the laboratory as requested by the clinical team. IMPRESSION: Successful ultrasound-guided paracentesis yielding 3.4 liters of peritoneal fluid. Read by:  Gareth Eagle, PA-C Electronically Signed   By: Corrie Mckusick D.O.   On: 02/15/2016 13:09    Kaity Pitstick, DO  [facility] Pager [phone]  If 7PM-7AM, please contact night-coverage [email or web address] Password [email or web address] 02/23/2016, 12:25 PM   LOS: 8 days

## 2016-02-23 NOTE — Procedures (Signed)
Patient seen on Hemodialysis. QB 400 UF goal 4 liters.  No complaints, BP still elevated.  Large IDWG. Treatment adjusted as needed.  Madelon Lips MD [facility]. Cell [phone] Pager # [phone] 10:21 AM

## 2016-02-24 DIAGNOSIS — Z992 Dependence on renal dialysis: Secondary | ICD-10-CM | POA: Diagnosis not present

## 2016-02-24 DIAGNOSIS — E8770 Fluid overload, unspecified: Secondary | ICD-10-CM

## 2016-02-24 DIAGNOSIS — I120 Hypertensive chronic kidney disease with stage 5 chronic kidney disease or end stage renal disease: Secondary | ICD-10-CM | POA: Diagnosis not present

## 2016-02-24 DIAGNOSIS — N186 End stage renal disease: Secondary | ICD-10-CM | POA: Diagnosis not present

## 2016-02-24 LAB — RENAL FUNCTION PANEL
Albumin: 2.6 g/dL — ABNORMAL LOW (ref 3.5–5.0)
Anion gap: 12 (ref 5–15)
BUN: 32 mg/dL — ABNORMAL HIGH (ref 6–20)
CO2: 25 mmol/L (ref 22–32)
Calcium: 9 mg/dL (ref 8.9–10.3)
Chloride: 93 mmol/L — ABNORMAL LOW (ref 101–111)
Creatinine, Ser: 4.72 mg/dL — ABNORMAL HIGH (ref 0.44–1.00)
GFR calc Af Amer: 12 mL/min — ABNORMAL LOW (ref >60)
GFR calc non Af Amer: 11 mL/min — ABNORMAL LOW (ref >60)
Glucose, Bld: 168 mg/dL — ABNORMAL HIGH (ref 65–99)
Phosphorus: 6.2 mg/dL — ABNORMAL HIGH (ref 2.5–4.6)
Potassium: 5 mmol/L (ref 3.5–5.1)
Sodium: 130 mmol/L — ABNORMAL LOW (ref 135–145)

## 2016-02-24 LAB — CBC
HCT: 29.1 % — ABNORMAL LOW (ref 36.0–46.0)
Hemoglobin: 8.8 g/dL — ABNORMAL LOW (ref 12.0–15.0)
MCH: 28.1 pg (ref 26.0–34.0)
MCHC: 30.2 g/dL (ref 30.0–36.0)
MCV: 93 fL (ref 78.0–100.0)
Platelets: 294 10*3/uL (ref 150–400)
RBC: 3.13 MIL/uL — ABNORMAL LOW (ref 3.87–5.11)
RDW: 17.7 % — ABNORMAL HIGH (ref 11.5–15.5)
WBC: 7.6 10*3/uL (ref 4.0–10.5)

## 2016-02-24 LAB — GLUCOSE, CAPILLARY
GLUCOSE-CAPILLARY: 112 mg/dL — AB (ref 65–99)
GLUCOSE-CAPILLARY: 46 mg/dL — AB (ref 65–99)
GLUCOSE-CAPILLARY: 106 mg/dL — AB (ref 65–99)
Glucose-Capillary: 120 mg/dL — ABNORMAL HIGH (ref 65–99)
Glucose-Capillary: 131 mg/dL — ABNORMAL HIGH (ref 65–99)
Glucose-Capillary: 132 mg/dL — ABNORMAL HIGH (ref 65–99)
Glucose-Capillary: 213 mg/dL — ABNORMAL HIGH (ref 65–99)
Glucose-Capillary: 46 mg/dL — ABNORMAL LOW (ref 65–99)
Glucose-Capillary: 53 mg/dL — ABNORMAL LOW (ref 65–99)
Glucose-Capillary: 88 mg/dL (ref 65–99)

## 2016-02-24 MED ORDER — DEXTROSE 50 % IV SOLN
25.0000 mL | Freq: Once | INTRAVENOUS | Status: AC
  Administered 2016-02-24: 25 mL via INTRAVENOUS

## 2016-02-24 MED ORDER — ONDANSETRON HCL 4 MG/2ML IJ SOLN
INTRAMUSCULAR | Status: AC
  Administered 2016-02-24: 4 mg via INTRAVENOUS
  Filled 2016-02-24: qty 2

## 2016-02-24 MED ORDER — DEXTROSE 50 % IV SOLN
INTRAVENOUS | Status: AC
  Administered 2016-02-24: 01:00:00
  Filled 2016-02-24: qty 50

## 2016-02-24 MED ORDER — TRAMADOL HCL 50 MG PO TABS
ORAL_TABLET | ORAL | Status: AC
  Administered 2016-02-24: 50 mg via ORAL
  Filled 2016-02-24: qty 1

## 2016-02-24 MED ORDER — AMOXICILLIN-POT CLAVULANATE 500-125 MG PO TABS: 1.0000 | ORAL_TABLET | Freq: Every day | ORAL | 1 refills | Status: DC

## 2016-02-24 MED ORDER — HYDRALAZINE HCL 100 MG PO TABS: 100.0000 mg | ORAL_TABLET | Freq: Three times a day (TID) | ORAL | 1 refills | Status: DC

## 2016-02-24 MED ORDER — INSULIN ASPART PROT & ASPART (70-30 MIX) 100 UNIT/ML ~~LOC~~ SUSP: 12.0000 [IU] | Freq: Every day | SUBCUTANEOUS | Status: DC

## 2016-02-24 MED ORDER — DEXTROSE 50 % IV SOLN
INTRAVENOUS | Status: AC
  Administered 2016-02-24: 02:00:00
  Filled 2016-02-24: qty 50

## 2016-02-24 MED ORDER — INSULIN ASPART PROT & ASPART (70-30 MIX) 100 UNIT/ML ~~LOC~~ SUSP
12.0000 [IU] | Freq: Every day | SUBCUTANEOUS | Status: DC
  Filled 2016-02-24: qty 10

## 2016-02-24 MED ORDER — DEXTROSE 50 % IV SOLN
50.0000 mL | Freq: Once | INTRAVENOUS | Status: AC
  Administered 2016-02-24: 50 mL via INTRAVENOUS
  Filled 2016-02-24: qty 50

## 2016-02-24 MED ORDER — GLUCOSE 40 % PO GEL
ORAL | Status: AC
  Administered 2016-02-24: 01:00:00
  Filled 2016-02-24: qty 1

## 2016-02-24 MED ORDER — DEXTROSE 50 % IV SOLN
1.0000 | Freq: Once | INTRAVENOUS | Status: AC
  Administered 2016-02-24: 50 mL via INTRAVENOUS

## 2016-02-24 MED ORDER — AMLODIPINE BESYLATE 10 MG PO TABS: 10.0000 mg | ORAL_TABLET | Freq: Every day | ORAL | 1 refills | Status: DC

## 2016-02-24 MED ORDER — TRAMADOL HCL 50 MG PO TABS: 50.0000 mg | ORAL_TABLET | Freq: Four times a day (QID) | ORAL | 0 refills | Status: DC | PRN

## 2016-02-24 MED ORDER — ISOSORBIDE MONONITRATE ER 120 MG PO TB24: 120.0000 mg | ORAL_TABLET | Freq: Every day | ORAL | 1 refills | Status: DC

## 2016-02-24 NOTE — Progress Notes (Signed)
[facility] Progress Note   Dialysis Orders: MWF NW  4h 31min   95.5kg  2/2 bath   Hep 3000  AVF Hect 6 ug Mircera 225 ug q 2wk last 9/20 Auryxia 2 tab ac and renvela 6 ac Sensipar 60/d - not taking Ca 10, P 7.2 Hb 9.3   Assessment/Plan: 1. New onset ascites - seen by cardiology and CHF team.  ECHO showing severe pulm HTN, grade 3 diast dysfunction. Hx of prolonged severe HTN. ANA neg renal artery U/S without stenosis. SPEP unremarkable.  GI following.  Ascites likely 2/2 pHTN. 2. pulm HTN: s/p RHC 9/27 elevated PA pressures, PCWP 21, high output state. Will attempt vol removal as below for optimization, cardiology following.  Discussed with Dr. Haroldine Laws, banding/ ligation of fistula unlikely to improve hemodynamics.   3. HTN/ vol: even with being under EDW, BP's are up.  Has room for lowering dry wt further, challenging 9/29.  Large IDWG.  Not adhering to fluid restriction.  Extra treatment today 9/30. 4. SBP - GS and peritoneal fluid cx's negative so far.  Cultures negative.  Gi following, will need SBP ppx as OP. 5. ESRD HD MWF- continuing to challenge wt as above 6. Anorexia/ wt loss- per primary, protein supplements, albumin 2.5 7. Anemia - no need for ESA, last mircera 225 dose 9/20, won't be due to 2 weeks hgb 9.2 - consistent with last outpt Hgb which has been trending down tsat 51% in August 8. 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    Objective Vitals:   02/24/16 0612 02/24/16 0722 02/24/16 0730 02/24/16 0800  BP: (!) 159/55 (!) 172/85 (!) 173/73 (!) 174/77  Pulse: 72 67 66 66  Resp: (!) 1     Temp: 97.5 F (36.4 C) 98.1 F (36.7 C)     TempSrc: Oral Oral    SpO2: 92% 97%    Weight:  92.8 kg (204 lb 9.4 oz)    Height:       Physical Exam General: chronically ill-appearing, playing on phone Heart: RRR, occasional atrial kick Lungs: muffled at bases, improved from prior Abdomen: + fluid wave, distended, mildly tender Extremities: trace LE edema Dialysis Access:  L AVF + bruit and thrill    Additional Objective Labs: Basic Metabolic Panel:  Recent Labs Lab 02/21/16 0811 02/21/16 2032 02/22/16 1240 02/23/16 0653  NA 135  --  134* 134*  K 5.4*  --  4.7 5.1  CL 93*  --  94* 94*  CO2 24  --  27 28  GLUCOSE 87  --  58* 84  BUN 41*  --  33* 48*  CREATININE 7.27* 4.96* 5.85* 7.30*  CALCIUM 9.7  --  9.5 9.9  PHOS 7.5*  --  6.4* 7.9*   Liver Function Tests:  Recent Labs Lab 02/21/16 0454 02/21/16 0811 02/22/16 1240 02/23/16 0653  AST 12*  --   --   --   ALT 10*  --   --   --   ALKPHOS 173*  --   --   --   BILITOT 0.5  --   --   --  PROT 6.1*  --   --   --   ALBUMIN 2.6* 2.5* 2.7* 2.6*   No results for input(s): LIPASE, AMYLASE in the last 168 hours. CBC:  Recent Labs Lab 02/21/16 0810 02/21/16 2032 02/22/16 1240 02/23/16 0653 02/24/16 0743  WBC 8.3 6.7 8.5 7.4 7.6  HGB 9.2* 9.1* 10.1* 9.1* 8.8*  HCT 31.2* 30.3* 32.7* 30.2* 29.1*  MCV 93.4 95.0 93.4 92.9 93.0  PLT 332 313 382 297 294   Blood Culture    Component Value Date/Time   SDES FLUID PERITONEAL 02/21/2016 1627   SDES FLUID PERITONEAL 02/21/2016 1627   SPECREQUEST NONE 02/21/2016 1627   SPECREQUEST NONE 02/21/2016 1627   CULT NO GROWTH 2 DAYS 02/21/2016 1627   REPTSTATUS 02/21/2016 FINAL 02/21/2016 1627   REPTSTATUS PENDING 02/21/2016 1627    CBG:  Recent Labs Lab 02/24/16 0223 02/24/16 0251 02/24/16 0325 02/24/16 0408 02/24/16 0640  GLUCAP 46* 131* 106* 120* 132*   Lab Results  Component Value Date   INR 1.24 02/15/2016   INR 1.14 02/15/2015   INR 1.09 02/09/2015   Studies/Results: No results  found. Medications:   . amLODipine  10 mg Oral Daily  . atorvastatin  40 mg Oral q1800  . bisacodyl  10 mg Rectal Once  . cefTRIAXone (ROCEPHIN)  IV  2 g Intravenous Q24H  . cloNIDine  0.3 mg Oral TID  . clopidogrel  75 mg Oral QHS  . docusate sodium  100 mg Oral BID  . doxercalciferol  4 mcg Intravenous Q M,W,F-HD  . heparin  5,000 Units Subcutaneous Q8H  . hydrALAZINE  100 mg Oral Q8H  . insulin aspart  0-9 Units Subcutaneous TID WC  . insulin aspart protamine- aspart  18 Units Subcutaneous Q breakfast  . insulin aspart protamine- aspart  23 Units Subcutaneous Q supper  . isosorbide mononitrate  120 mg Oral Daily  . labetalol  300 mg Oral TID  . senna  2 tablet Oral Daily  . sevelamer carbonate  3,200 mg Oral TID WC  . sodium chloride flush  3 mL Intravenous Q12H  . sodium chloride flush  3 mL Intravenous Q12H      Madelon Lips MD [facility] pgr [phone] cell [phone]

## 2016-02-24 NOTE — Progress Notes (Signed)
Patient c/o Nausea. Zofran given IV as ordered and CBG performed d/t patient pale and diaphoretic.  CBG @ 0038= 46, 1 amp of D50 given IV & 1 pack of graham crackers and peanut butter, notified M. Lynch (ordered 1 amp) @ 0121= 53, 1/2 amp of D50, chicken from lean cuisine meal eaten, & 1 can coke, M. Lynch notified (ordered 1/2 amp) @ 0132=112, no intervention @ 0147=88, no intervention @ 0223= 46, 1 amp of D 50 given, MD notified (ordered 1 amp) @ 0251= 131, No intervention @ 0325= 106, no intervention @ 0408= 120, no intervention Patient complained of nausea and feeling bad while CBG was low. Patient did not understand the importance of eating a protein after a quick sugar. Refused Ensure, Nepro, Milk, etc. Patient now sleeping. Patient does not understand her current insulin regimen and when the insulin peaks, etc. Patient requires more education when she is ready.

## 2016-02-24 NOTE — Procedures (Signed)
Patient seen on Hemodialysis. QB 400 UF goal 3 liters.  Extra treatment for volume optimization.  She will need a new EDW on discharge.   Treatment adjusted as needed.  [name]. Cell [phone] Pgr: [phone] 8:38 AM

## 2016-02-24 NOTE — Significant Event (Signed)
Rapid Response Event Note   Called by Marcene Brawn, RN  For hypoglycemic patient not responding to Dextrose and oral intake.  At 0038 CBG  Was 46 and pt was given Dextrose 50% , one amp, crackers and peanut butter.  Repeat CBG at   0121 was 53 treated with Dextrose 50%  1/2 amp, coke and chicken.  Pt was given Zofran at 0033 for nausea. Pt also received 70/30 Insulin 23 units at 2208   At 0223 CBG 46 treated with 1/2 amp Dextrose, then IV infiltrated.  New IV placed by IV team and Dextrose 50% 1amp was give. repeat CBG at 0250 was 131.  rview: Time Called: 0225 Arrival Time: 0230 Event Type: Other (Comment) (hypoglycemia)  Initial Focused Assessment: Upon my arrival pt was lying in bed , alert, oriented  times 4,Skin warm and dry Temp 97.8c/o   Denies pain, SOB.  Bilateral BS clear,  RR 19 with RA O2 sats 97% Heart rated regular , non tele with HR 68 and  BP 166/65.  Abd grossly distended, active bowel sounds.  Pt c/o continued nausea and just feeling tired.   Interventions:  Dc'd infiltrated IV, moral support given to patient and nurse  Plan of Care (if not transferred): Hand off report given to [place], RN with instruction to repeat CBG in 30 minutes and to continue to follow hypoglycemic protocol.  Keep pt NPO if nausea persists. Call for assistance if neeeded.  F/u:  0325  CBG 106 F/u : 0408  CBG  120    Event Summary: Name of Physician Notified: Walden Field at [phone]    at    Outcome: Stayed in room and stabalized     Earla Charlie, Gust Brooms

## 2016-02-26 DIAGNOSIS — N186 End stage renal disease: Secondary | ICD-10-CM | POA: Diagnosis not present

## 2016-02-26 DIAGNOSIS — N2581 Secondary hyperparathyroidism of renal origin: Secondary | ICD-10-CM | POA: Diagnosis not present

## 2016-02-26 DIAGNOSIS — Z23 Encounter for immunization: Secondary | ICD-10-CM | POA: Diagnosis not present

## 2016-02-26 DIAGNOSIS — D631 Anemia in chronic kidney disease: Secondary | ICD-10-CM | POA: Diagnosis not present

## 2016-02-26 LAB — CULTURE, BODY FLUID W GRAM STAIN -BOTTLE

## 2016-02-26 LAB — CULTURE, BODY FLUID-BOTTLE: CULTURE: NO GROWTH

## 2016-02-28 DIAGNOSIS — D631 Anemia in chronic kidney disease: Secondary | ICD-10-CM | POA: Diagnosis not present

## 2016-02-28 DIAGNOSIS — N2581 Secondary hyperparathyroidism of renal origin: Secondary | ICD-10-CM | POA: Diagnosis not present

## 2016-02-28 DIAGNOSIS — Z23 Encounter for immunization: Secondary | ICD-10-CM | POA: Diagnosis not present

## 2016-02-28 DIAGNOSIS — N186 End stage renal disease: Secondary | ICD-10-CM | POA: Diagnosis not present

## 2016-02-29 DIAGNOSIS — I110 Hypertensive heart disease with heart failure: Secondary | ICD-10-CM | POA: Diagnosis not present

## 2016-02-29 DIAGNOSIS — I5032 Chronic diastolic (congestive) heart failure: Secondary | ICD-10-CM | POA: Diagnosis not present

## 2016-02-29 DIAGNOSIS — R188 Other ascites: Secondary | ICD-10-CM | POA: Diagnosis not present

## 2016-02-29 DIAGNOSIS — N186 End stage renal disease: Secondary | ICD-10-CM | POA: Diagnosis not present

## 2016-03-01 DIAGNOSIS — D631 Anemia in chronic kidney disease: Secondary | ICD-10-CM | POA: Diagnosis not present

## 2016-03-01 DIAGNOSIS — Z23 Encounter for immunization: Secondary | ICD-10-CM | POA: Diagnosis not present

## 2016-03-01 DIAGNOSIS — N2581 Secondary hyperparathyroidism of renal origin: Secondary | ICD-10-CM | POA: Diagnosis not present

## 2016-03-01 DIAGNOSIS — N186 End stage renal disease: Secondary | ICD-10-CM | POA: Diagnosis not present

## 2016-03-04 DIAGNOSIS — D631 Anemia in chronic kidney disease: Secondary | ICD-10-CM | POA: Diagnosis not present

## 2016-03-04 DIAGNOSIS — Z23 Encounter for immunization: Secondary | ICD-10-CM | POA: Diagnosis not present

## 2016-03-04 DIAGNOSIS — N186 End stage renal disease: Secondary | ICD-10-CM | POA: Diagnosis not present

## 2016-03-04 DIAGNOSIS — N2581 Secondary hyperparathyroidism of renal origin: Secondary | ICD-10-CM | POA: Diagnosis not present

## 2016-03-05 DIAGNOSIS — E782 Mixed hyperlipidemia: Secondary | ICD-10-CM | POA: Diagnosis not present

## 2016-03-05 DIAGNOSIS — K652 Spontaneous bacterial peritonitis: Secondary | ICD-10-CM | POA: Diagnosis not present

## 2016-03-05 DIAGNOSIS — E1065 Type 1 diabetes mellitus with hyperglycemia: Secondary | ICD-10-CM | POA: Diagnosis not present

## 2016-03-05 DIAGNOSIS — Z09 Encounter for follow-up examination after completed treatment for conditions other than malignant neoplasm: Secondary | ICD-10-CM | POA: Diagnosis not present

## 2016-03-05 DIAGNOSIS — E1021 Type 1 diabetes mellitus with diabetic nephropathy: Secondary | ICD-10-CM | POA: Diagnosis not present

## 2016-03-06 DIAGNOSIS — N2581 Secondary hyperparathyroidism of renal origin: Secondary | ICD-10-CM | POA: Diagnosis not present

## 2016-03-06 DIAGNOSIS — N186 End stage renal disease: Secondary | ICD-10-CM | POA: Diagnosis not present

## 2016-03-06 DIAGNOSIS — Z23 Encounter for immunization: Secondary | ICD-10-CM | POA: Diagnosis not present

## 2016-03-06 DIAGNOSIS — D631 Anemia in chronic kidney disease: Secondary | ICD-10-CM | POA: Diagnosis not present

## 2016-03-08 DIAGNOSIS — N2581 Secondary hyperparathyroidism of renal origin: Secondary | ICD-10-CM | POA: Diagnosis not present

## 2016-03-08 DIAGNOSIS — D631 Anemia in chronic kidney disease: Secondary | ICD-10-CM | POA: Diagnosis not present

## 2016-03-08 DIAGNOSIS — N186 End stage renal disease: Secondary | ICD-10-CM | POA: Diagnosis not present

## 2016-03-08 DIAGNOSIS — Z23 Encounter for immunization: Secondary | ICD-10-CM | POA: Diagnosis not present

## 2016-03-09 ENCOUNTER — Emergency Department (HOSPITAL_COMMUNITY)
Admission: EM | Admit: 2016-03-09 | Discharge: 2016-03-09 | Disposition: A | Discharge status: Home / Self Care | Payer: Medicare Other | Attending: Emergency Medicine | Admitting: Emergency Medicine

## 2016-03-09 ENCOUNTER — Encounter (HOSPITAL_COMMUNITY): Payer: Self-pay | Admitting: *Deleted

## 2016-03-09 DIAGNOSIS — Z992 Dependence on renal dialysis: Secondary | ICD-10-CM | POA: Insufficient documentation

## 2016-03-09 DIAGNOSIS — E1140 Type 2 diabetes mellitus with diabetic neuropathy, unspecified: Secondary | ICD-10-CM | POA: Insufficient documentation

## 2016-03-09 DIAGNOSIS — Y929 Unspecified place or not applicable: Secondary | ICD-10-CM | POA: Diagnosis not present

## 2016-03-09 DIAGNOSIS — N186 End stage renal disease: Secondary | ICD-10-CM | POA: Diagnosis not present

## 2016-03-09 DIAGNOSIS — X58XXXA Exposure to other specified factors, initial encounter: Secondary | ICD-10-CM | POA: Diagnosis not present

## 2016-03-09 DIAGNOSIS — S0991XA Unspecified injury of ear, initial encounter: Secondary | ICD-10-CM | POA: Insufficient documentation

## 2016-03-09 DIAGNOSIS — Z87891 Personal history of nicotine dependence: Secondary | ICD-10-CM | POA: Insufficient documentation

## 2016-03-09 DIAGNOSIS — Y939 Activity, unspecified: Secondary | ICD-10-CM | POA: Insufficient documentation

## 2016-03-09 DIAGNOSIS — Y999 Unspecified external cause status: Secondary | ICD-10-CM | POA: Insufficient documentation

## 2016-03-09 DIAGNOSIS — I132 Hypertensive heart and chronic kidney disease with heart failure and with stage 5 chronic kidney disease, or end stage renal disease: Secondary | ICD-10-CM | POA: Insufficient documentation

## 2016-03-09 DIAGNOSIS — Z794 Long term (current) use of insulin: Secondary | ICD-10-CM | POA: Insufficient documentation

## 2016-03-09 DIAGNOSIS — I5033 Acute on chronic diastolic (congestive) heart failure: Secondary | ICD-10-CM | POA: Diagnosis not present

## 2016-03-09 DIAGNOSIS — E1122 Type 2 diabetes mellitus with diabetic chronic kidney disease: Secondary | ICD-10-CM | POA: Insufficient documentation

## 2016-03-09 DIAGNOSIS — I2510 Atherosclerotic heart disease of native coronary artery without angina pectoris: Secondary | ICD-10-CM | POA: Insufficient documentation

## 2016-03-09 MED ORDER — SILVER NITRATE-POT NITRATE 75-25 % EX MISC
1.0000 "application " | Freq: Once | CUTANEOUS | Status: AC
  Administered 2016-03-09: 1 via TOPICAL
  Filled 2016-03-09: qty 2

## 2016-03-09 MED ORDER — BACITRACIN ZINC 500 UNIT/GM EX OINT
TOPICAL_OINTMENT | CUTANEOUS | Status: AC
  Administered 2016-03-09: 15:00:00
  Filled 2016-03-09: qty 0.9

## 2016-03-09 NOTE — Discharge Instructions (Signed)
Bleeding was controlled in ED. Please keep an eye on signs of infection including increased redness increased pain or developing fevers. Please follow-up primary care doctor or return to ED. May take tylenol for pain. If oozing occurs again apply direct pressure for 15-20 without letting go. If bleeding persists please return to the ED.

## 2016-03-09 NOTE — ED Notes (Signed)
PA at bedside.

## 2016-03-09 NOTE — ED Provider Notes (Signed)
Patient oozing blood from tragus of left ear at site of ear piercing which occurred yesterday.    Orlie Dakin, MD 03/09/16 [phone]

## 2016-03-09 NOTE — ED Triage Notes (Addendum)
Patient presents to ED from home with husband.  She had a piercing of left ear cartilage around 1800 yesterday.  Patient states piercing was done to help with her migraines.  Patient states around 8 hours ago the piercing began bleeding.  Currently, bleeding is controlled, but oozing.  Patient was on Plavix for "a small blockage" in her heart.  Patient stopped the Plavix 2 weeks ago in anticipation of this piercing.  Patient reluctant to take her earrings out in triage.  Patient denies pain anywhere aside from left ear.  On arrival to treatment room, patient declined to change into a gown.

## 2016-03-09 NOTE — ED Provider Notes (Signed)
[facility] DEPT Provider Note   CSN: [id number] Arrival date & time: 03/09/16  1100     History   Chief Complaint Chief Complaint  Patient presents with  . Ear Injury    HPI [name] is a 40 y.o. female.  40 year old Caucasian female past medical history significant for ESRD on dialysis, hypertension, diabetes that presents to the ED today with bleeding to left ear.Patient had an earring placed in left tragus yesterday for migraine treatment. Patient states that approximately 8 hours ago the piercing began bleeding. She's been trying to apply pressure home but the bleeding is not controlled. She states that he continues to ooze patient states she does not want her earring removed. Patient endorses minimal pain. Patient states that she was on Plavix for CAD. States she stopped her Plavix 2 weeks ago prior to piercing. Patient is not currently anticoagulated this time. Patient states that she is on dialysis gets dialyzed 3 times a week. She was last dialyzed yesterday. Patient denies any other pain or symptoms this time. She states she does not usually have issues with bleeding. Denies any fever, chills, headache, vision changes, otalgia, ecchymosis, chest pain, shortness of breath, abdominal pain, urinary symptoms, change in bowel habits, numbness/tingling. Bleeding was currently controlled in triage however when patient was placed in room the site continued to ooze blood.      Past Medical History:  Diagnosis Date  . Chronic anemia    2nd to renal disease  . CIN III (cervical intraepithelial neoplasia grade III) with severe dysplasia    S/P LEEP AND CONE  . Coronary artery disease    Status post cardiac catheterization June 2012 scattered coronary artery disease/atherosclerosis with 70-80% stenosis in a small right PDA.  . Diabetic Charcot's joint disease ([place])   . DM (diabetes mellitus) ([place])    Long-term insulin  . End stage renal disease on dialysis ([place]) 05/02/11   [facility]; M; W, F; last time 05/01/11  . Fracture of 5th metatarsal 2016   Right  . Gastroparesis   . 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 History of abscesses in groins 12/06/2010  . Hyperlipidemia    Hypertriglyceridemia 449 HDL 25  . Hypertension   . Migraines    "just on dialysis days"  . Peripheral neuropathy (HCC)    related to DM  . Peripheral vascular disease ([place])    Tibial occlusive disease evaluated by Dr. Kellie Simmering in August 2011. Medical therapy  . Renal insufficiency    Dialysis since 2012  . Tobacco use disorder    Discontinued March 2012  . Trimalleolar fracture of ankle, closed 02/09/2015   Right    Patient Active Problem List   Diagnosis Date Noted  . Acute on chronic diastolic CHF (congestive heart failure) ([place]) 02/22/2016  . Fluid overload   . Pericardial effusion   . Ascites 02/15/2016  . DM (diabetes mellitus), type 2 with renal complications ([place]) [id number]  . Pain   . Bimalleolar ankle fracture 02/12/2015  . CAD (coronary artery disease) 02/09/2015  . Peripheral vascular disease ([place])   . Gastroparesis   . Chronic anemia   . Menorrhagia with regular cycle 01/04/2014  . Dysmenorrhea 01/04/2014  . Nausea & vomiting 10/16/2011  . History of noncompliance with medical treatment 05/09/2011  . Chronic UTI 05/09/2011  . CIN III (cervical intraepithelial neoplasia grade III) with severe dysplasia   . History of abscesses in groins 12/06/2010  . End stage renal disease on dialysis ([place])   .  Coronary artery disease   . Hyperlipidemia   . 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 NONDEPENDENT TOBACCO USE DISORDER 12/20/2009  . Essential hypertension, benign 12/20/2009  . NEPHROTIC SYNDROME 12/20/2009  . Shortness of breath 12/20/2009  . Chest pain 12/20/2009    Past Surgical History:  Procedure Laterality Date  . AV FISTULA PLACEMENT  04/2010  . CARDIAC CATHETERIZATION [name] 02/10/2015   Procedure: Left Heart Cath and Coronary Angiography;  Surgeon: Dixie Dials, MD;   Location: [facility];  Service: Cardiovascular;  Laterality: [name];  . CARDIAC CATHETERIZATION [name] 02/21/2016   Procedure: Right Heart Cath;  Surgeon: Jolaine Artist, MD;  Location: [facility];  Service: Cardiovascular;  Laterality: [name];  . CERVICAL BIOPSY  W/ LOOP ELECTRODE EXCISION     h/o  . CERVICAL CONE BIOPSY     h/o  . COLPOSCOPY    . DILATION AND CURETTAGE OF UTERUS  2009  . ORIF ANKLE FRACTURE Right 02/09/2015  . ORIF ANKLE FRACTURE Right 02/16/2015   Procedure: OPEN REDUCTION INTERNAL FIXATION (ORIF) RIGHT ANKLE FRACTURE;  Surgeon: Altamese [place], MD;  Location: [facility];  Service: Orthopedics;  Laterality: Right;    OB History    Gravida Para Term Preterm AB Living   2 1 1   1 1    SAB TAB Ectopic Multiple Live Births                   Home Medications    Prior to Admission medications   Medication Sig Start Date End Date Taking? Authorizing Provider  amLODipine (NORVASC) 10 MG tablet Take 1 tablet (10 mg total) by mouth daily. 02/24/16   Orson Eva, MD  amoxicillin-clavulanate (AUGMENTIN) 500-125 MG tablet Take 1 tablet (500 mg total) by mouth daily. X 7 days, then on Mon-Wed-Fri only after dialysis 02/24/16   Orson Eva, MD  atorvastatin (LIPITOR) 40 MG tablet Take 40 mg by mouth daily.    Historical Provider, MD  cloNIDine (CATAPRES) 0.3 MG tablet Take 1 tablet (0.3 mg total) by mouth 3 (three) times daily. 11/01/14   Debbe Odea, MD  clopidogrel (PLAVIX) 75 MG tablet Take 75 mg by mouth at bedtime.     Historical Provider, MD  Ferric Citrate (AURYXIA PO) Take 2 tablets by mouth 3 (three) times daily with meals.    Historical Provider, MD  HUMALOG MIX 50/50 KWIKPEN (50-50) 100 UNIT/ML Kwikpen Inject 26-28 Units into the skin 2 (two) times daily. 28 in the morning and 26 in the evening. 04/29/14   Historical Provider, MD  hydrALAZINE (APRESOLINE) 100 MG tablet Take 1 tablet (100 mg total) by mouth every 8 (eight) hours. 02/24/16   Orson Eva, MD  isosorbide  mononitrate (IMDUR) 120 MG 24 hr tablet Take 1 tablet (120 mg total) by mouth daily. 02/25/16   Orson Eva, MD  labetalol (NORMODYNE) 300 MG tablet Take 1 tablet (300 mg total) by mouth 3 (three) times daily. 11/01/14   Debbe Odea, MD  RENVELA 800 MG tablet Take 4,800 mg by mouth 3 (three) times daily with meals.  04/17/13   Historical Provider, MD  traMADol (ULTRAM) 50 MG tablet Take 1 tablet (50 mg total) by mouth every 6 (six) hours as needed for moderate pain (or Headache unrelieved by tylenol). 02/24/16   Orson Eva, MD    Family History Family History  Problem Relation Age of Onset  . Diabetes Mother   . Hypertension Mother   . Diabetes Father   . Hyperlipidemia Father   .  Hypertension Father   . Diabetes Sister   . Stroke Maternal Grandmother   . Diabetes Sister   . Breast cancer Maternal Aunt     Age 10's    Social History Social History  Substance Use Topics  . Smoking status: Former Smoker    Packs/day: 0.30    Years: 10.00    Types: Cigarettes    Quit date: 08/05/2011  . Smokeless tobacco: Never Used     Comment: "quit smoking cigarettes 07/2010"  . Alcohol use No     Allergies   Cephalexin and Sulfamethoxazole-trimethoprim   Review of Systems Review of Systems  Constitutional: Negative for chills and fever.  HENT: Positive for ear discharge (bleeding from earring). Negative for congestion, ear pain, rhinorrhea and sore throat.   Eyes: Negative for pain and visual disturbance.  Respiratory: Negative for cough and shortness of breath.   Cardiovascular: Negative for chest pain, palpitations and leg swelling.  Gastrointestinal: Negative for abdominal pain, diarrhea, nausea and vomiting.  Genitourinary: Negative for dysuria, hematuria and urgency.  Musculoskeletal: Negative for arthralgias and back pain.  Skin: Negative for color change and rash.  Neurological: Negative for dizziness, seizures, syncope, weakness, light-headedness, numbness and headaches.    Hematological: Does not bruise/bleed easily.  All other systems reviewed and are negative.    Physical Exam Updated Vital Signs BP 184/81 (BP Location: Right Arm)   Pulse 71   Temp 98.6 F (37 C) (Oral)   Resp 19   Ht 6\' 4"  (1.93 m)   Wt 87.5 kg   LMP 09/16/2015 Comment: dialysis  SpO2 98%   BMI 23.49 kg/m   Physical Exam  Constitutional: She appears well-developed and well-nourished. No distress.  HENT:  Head: Normocephalic and atraumatic.  Left Ear: Tympanic membrane, external ear and ear canal normal.  Mouth/Throat: Oropharynx is clear and moist.  Right TM is clear with good cone of light. No pain with manipulation of tragus or auricle. Canal is normal. Hearing intact bilaterally.  Unable to visualize left TM due to cerumen. Canal with small amount of cerumen. Bleeding located to the tragus of left ear where piercing is placed unable to control bleeding. No blood in the canal. Patient with minimal pain to palpation. Hearing is normal. No erythema, edema or warmth appreciated.   Eyes: Conjunctivae are normal. Right eye exhibits no discharge. Left eye exhibits no discharge. No scleral icterus.  Neck: Normal range of motion. Neck supple. No thyromegaly present.  Cardiovascular: Normal rate, regular rhythm, normal heart sounds and intact distal pulses.  Exam reveals no gallop and no friction rub.   No murmur heard. Pulmonary/Chest: Effort normal and breath sounds normal.  Abdominal: Soft. Bowel sounds are normal. She exhibits no distension. There is no tenderness.  Musculoskeletal: Normal range of motion.  Dialysis shunt to left forearm without any complications noted.   Lymphadenopathy:    She has no cervical adenopathy.  Neurological: She is alert.  Skin: Skin is warm and dry. Capillary refill takes less than 2 seconds.  Nursing note and vitals reviewed.    ED Treatments / Results  Labs (all labs ordered are listed, but only abnormal results are displayed) Labs  Reviewed - No data to display  EKG  EKG Interpretation None       Radiology No results found.  Procedures Procedures (including critical care time)  Medications Ordered in ED Medications  silver nitrate applicators applicator 1 application (1 application Topical Given by Other 03/09/16 1519)  bacitracin 500 UNIT/GM ointment (  Given by Other 03/09/16 1520)     Initial Impression / Assessment and Plan / ED Course  I have reviewed the triage vital signs and the nursing notes.  Pertinent labs & imaging results that were available during my care of the patient were reviewed by me and considered in my medical decision making (see chart for details).  Clinical Course  Patient presented to the ED with bleeding from earring site of left tragus. Patient at first did not want earring removed. She wanted [place] to cauterize the bleeding while the earring was in. Discussed with patient that this could cause scarring and worsening symptoms. Pressure was held by nurse for 15-20 minutes. On re exam bleeding still noted to the earring site. Talked with patient and believe that earring needs to be removed. She was agreeable to plan and earring was removed. Pressure was held for 20 min. On re exam bleeding was controlled and localized to the site of piercing. While re examining manipulation of the site caused slight oozing from tragus. Dicussed options with patient and she would like for [place] to cauterize the site. Discussed case with Dr. Winfred Leeds who examined patient and agrees. Patient informed of risk of silver nitrate including pain, infection, and skin discoloration. Silver nitrate was applied that stopped bleeding. Bacitracin was applied to site. Bleeding was controlled. There were no signs of infection including erythema, edema, or fevers. Patient bp elevated. History of same. Patient states they are working on getting her bp under control by nephrology. Patient to be dialyzed on Monday. She will be  discharged home with strict return precautions including signs of infection. Dr. Winfred Leeds saw and examined patient and is agreeable to plan. Patient is hemodynamically stable and is afebrile. Discharged home in NAD with stable vs. Patient encouraged to follow up with pcp.   Final Clinical Impressions(s) / ED Diagnoses   Final diagnoses:  Ear injury, initial encounter    New Prescriptions Discharge Medication List as of 03/09/2016  3:15 PM       Doristine Devoid, PA-C 03/11/16 [place], MD 03/13/16 0800

## 2016-03-11 DIAGNOSIS — N2581 Secondary hyperparathyroidism of renal origin: Secondary | ICD-10-CM | POA: Diagnosis not present

## 2016-03-11 DIAGNOSIS — D631 Anemia in chronic kidney disease: Secondary | ICD-10-CM | POA: Diagnosis not present

## 2016-03-11 DIAGNOSIS — Z23 Encounter for immunization: Secondary | ICD-10-CM | POA: Diagnosis not present

## 2016-03-11 DIAGNOSIS — N186 End stage renal disease: Secondary | ICD-10-CM | POA: Diagnosis not present

## 2016-03-13 DIAGNOSIS — Z23 Encounter for immunization: Secondary | ICD-10-CM | POA: Diagnosis not present

## 2016-03-13 DIAGNOSIS — N2581 Secondary hyperparathyroidism of renal origin: Secondary | ICD-10-CM | POA: Diagnosis not present

## 2016-03-13 DIAGNOSIS — N186 End stage renal disease: Secondary | ICD-10-CM | POA: Diagnosis not present

## 2016-03-13 DIAGNOSIS — D631 Anemia in chronic kidney disease: Secondary | ICD-10-CM | POA: Diagnosis not present

## 2016-03-15 DIAGNOSIS — D631 Anemia in chronic kidney disease: Secondary | ICD-10-CM | POA: Diagnosis not present

## 2016-03-15 DIAGNOSIS — N2581 Secondary hyperparathyroidism of renal origin: Secondary | ICD-10-CM | POA: Diagnosis not present

## 2016-03-15 DIAGNOSIS — N186 End stage renal disease: Secondary | ICD-10-CM | POA: Diagnosis not present

## 2016-03-15 DIAGNOSIS — Z23 Encounter for immunization: Secondary | ICD-10-CM | POA: Diagnosis not present

## 2016-03-18 DIAGNOSIS — N2581 Secondary hyperparathyroidism of renal origin: Secondary | ICD-10-CM | POA: Diagnosis not present

## 2016-03-18 DIAGNOSIS — Z23 Encounter for immunization: Secondary | ICD-10-CM | POA: Diagnosis not present

## 2016-03-18 DIAGNOSIS — N186 End stage renal disease: Secondary | ICD-10-CM | POA: Diagnosis not present

## 2016-03-18 DIAGNOSIS — D631 Anemia in chronic kidney disease: Secondary | ICD-10-CM | POA: Diagnosis not present

## 2016-03-20 DIAGNOSIS — Z23 Encounter for immunization: Secondary | ICD-10-CM | POA: Diagnosis not present

## 2016-03-20 DIAGNOSIS — N186 End stage renal disease: Secondary | ICD-10-CM | POA: Diagnosis not present

## 2016-03-20 DIAGNOSIS — D631 Anemia in chronic kidney disease: Secondary | ICD-10-CM | POA: Diagnosis not present

## 2016-03-20 DIAGNOSIS — N2581 Secondary hyperparathyroidism of renal origin: Secondary | ICD-10-CM | POA: Diagnosis not present

## 2016-03-20 DIAGNOSIS — E1129 Type 2 diabetes mellitus with other diabetic kidney complication: Secondary | ICD-10-CM | POA: Diagnosis not present

## 2016-03-22 DIAGNOSIS — N186 End stage renal disease: Secondary | ICD-10-CM | POA: Diagnosis not present

## 2016-03-22 DIAGNOSIS — Z23 Encounter for immunization: Secondary | ICD-10-CM | POA: Diagnosis not present

## 2016-03-22 DIAGNOSIS — N2581 Secondary hyperparathyroidism of renal origin: Secondary | ICD-10-CM | POA: Diagnosis not present

## 2016-03-22 DIAGNOSIS — D631 Anemia in chronic kidney disease: Secondary | ICD-10-CM | POA: Diagnosis not present

## 2016-03-25 DIAGNOSIS — I739 Peripheral vascular disease, unspecified: Secondary | ICD-10-CM | POA: Diagnosis not present

## 2016-03-25 DIAGNOSIS — N2581 Secondary hyperparathyroidism of renal origin: Secondary | ICD-10-CM | POA: Diagnosis not present

## 2016-03-25 DIAGNOSIS — I2510 Atherosclerotic heart disease of native coronary artery without angina pectoris: Secondary | ICD-10-CM | POA: Diagnosis not present

## 2016-03-25 DIAGNOSIS — N186 End stage renal disease: Secondary | ICD-10-CM | POA: Diagnosis not present

## 2016-03-25 DIAGNOSIS — D631 Anemia in chronic kidney disease: Secondary | ICD-10-CM | POA: Diagnosis not present

## 2016-03-25 DIAGNOSIS — I10 Essential (primary) hypertension: Secondary | ICD-10-CM | POA: Diagnosis not present

## 2016-03-25 DIAGNOSIS — Z23 Encounter for immunization: Secondary | ICD-10-CM | POA: Diagnosis not present

## 2016-03-26 DIAGNOSIS — E1065 Type 1 diabetes mellitus with hyperglycemia: Secondary | ICD-10-CM | POA: Diagnosis not present

## 2016-03-26 DIAGNOSIS — K652 Spontaneous bacterial peritonitis: Secondary | ICD-10-CM | POA: Diagnosis not present

## 2016-03-26 DIAGNOSIS — Z992 Dependence on renal dialysis: Secondary | ICD-10-CM | POA: Diagnosis not present

## 2016-03-26 DIAGNOSIS — I120 Hypertensive chronic kidney disease with stage 5 chronic kidney disease or end stage renal disease: Secondary | ICD-10-CM | POA: Diagnosis not present

## 2016-03-26 DIAGNOSIS — N186 End stage renal disease: Secondary | ICD-10-CM | POA: Diagnosis not present

## 2016-03-27 DIAGNOSIS — N2581 Secondary hyperparathyroidism of renal origin: Secondary | ICD-10-CM | POA: Diagnosis not present

## 2016-03-27 DIAGNOSIS — N186 End stage renal disease: Secondary | ICD-10-CM | POA: Diagnosis not present

## 2016-03-27 DIAGNOSIS — D631 Anemia in chronic kidney disease: Secondary | ICD-10-CM | POA: Diagnosis not present

## 2016-03-27 DIAGNOSIS — E1129 Type 2 diabetes mellitus with other diabetic kidney complication: Secondary | ICD-10-CM | POA: Diagnosis not present

## 2016-03-29 DIAGNOSIS — D631 Anemia in chronic kidney disease: Secondary | ICD-10-CM | POA: Diagnosis not present

## 2016-03-29 DIAGNOSIS — N2581 Secondary hyperparathyroidism of renal origin: Secondary | ICD-10-CM | POA: Diagnosis not present

## 2016-03-29 DIAGNOSIS — N186 End stage renal disease: Secondary | ICD-10-CM | POA: Diagnosis not present

## 2016-03-29 DIAGNOSIS — E1129 Type 2 diabetes mellitus with other diabetic kidney complication: Secondary | ICD-10-CM | POA: Diagnosis not present

## 2016-04-01 DIAGNOSIS — E1129 Type 2 diabetes mellitus with other diabetic kidney complication: Secondary | ICD-10-CM | POA: Diagnosis not present

## 2016-04-01 DIAGNOSIS — D631 Anemia in chronic kidney disease: Secondary | ICD-10-CM | POA: Diagnosis not present

## 2016-04-01 DIAGNOSIS — N186 End stage renal disease: Secondary | ICD-10-CM | POA: Diagnosis not present

## 2016-04-01 DIAGNOSIS — N2581 Secondary hyperparathyroidism of renal origin: Secondary | ICD-10-CM | POA: Diagnosis not present

## 2016-04-02 DIAGNOSIS — E1065 Type 1 diabetes mellitus with hyperglycemia: Secondary | ICD-10-CM | POA: Diagnosis not present

## 2016-04-03 DIAGNOSIS — N186 End stage renal disease: Secondary | ICD-10-CM | POA: Diagnosis not present

## 2016-04-03 DIAGNOSIS — E1129 Type 2 diabetes mellitus with other diabetic kidney complication: Secondary | ICD-10-CM | POA: Diagnosis not present

## 2016-04-03 DIAGNOSIS — D631 Anemia in chronic kidney disease: Secondary | ICD-10-CM | POA: Diagnosis not present

## 2016-04-03 DIAGNOSIS — N2581 Secondary hyperparathyroidism of renal origin: Secondary | ICD-10-CM | POA: Diagnosis not present

## 2016-04-04 DIAGNOSIS — R188 Other ascites: Secondary | ICD-10-CM | POA: Diagnosis not present

## 2016-04-04 DIAGNOSIS — I110 Hypertensive heart disease with heart failure: Secondary | ICD-10-CM | POA: Diagnosis not present

## 2016-04-04 DIAGNOSIS — N186 End stage renal disease: Secondary | ICD-10-CM | POA: Diagnosis not present

## 2016-04-04 DIAGNOSIS — I5032 Chronic diastolic (congestive) heart failure: Secondary | ICD-10-CM | POA: Diagnosis not present

## 2016-04-05 DIAGNOSIS — E1129 Type 2 diabetes mellitus with other diabetic kidney complication: Secondary | ICD-10-CM | POA: Diagnosis not present

## 2016-04-05 DIAGNOSIS — N2581 Secondary hyperparathyroidism of renal origin: Secondary | ICD-10-CM | POA: Diagnosis not present

## 2016-04-05 DIAGNOSIS — D631 Anemia in chronic kidney disease: Secondary | ICD-10-CM | POA: Diagnosis not present

## 2016-04-05 DIAGNOSIS — N186 End stage renal disease: Secondary | ICD-10-CM | POA: Diagnosis not present

## 2016-04-08 DIAGNOSIS — N186 End stage renal disease: Secondary | ICD-10-CM | POA: Diagnosis not present

## 2016-04-08 DIAGNOSIS — D631 Anemia in chronic kidney disease: Secondary | ICD-10-CM | POA: Diagnosis not present

## 2016-04-08 DIAGNOSIS — N2581 Secondary hyperparathyroidism of renal origin: Secondary | ICD-10-CM | POA: Diagnosis not present

## 2016-04-08 DIAGNOSIS — E1129 Type 2 diabetes mellitus with other diabetic kidney complication: Secondary | ICD-10-CM | POA: Diagnosis not present

## 2016-04-10 DIAGNOSIS — N186 End stage renal disease: Secondary | ICD-10-CM | POA: Diagnosis not present

## 2016-04-10 DIAGNOSIS — N2581 Secondary hyperparathyroidism of renal origin: Secondary | ICD-10-CM | POA: Diagnosis not present

## 2016-04-10 DIAGNOSIS — E1129 Type 2 diabetes mellitus with other diabetic kidney complication: Secondary | ICD-10-CM | POA: Diagnosis not present

## 2016-04-10 DIAGNOSIS — D631 Anemia in chronic kidney disease: Secondary | ICD-10-CM | POA: Diagnosis not present

## 2016-04-12 DIAGNOSIS — N2581 Secondary hyperparathyroidism of renal origin: Secondary | ICD-10-CM | POA: Diagnosis not present

## 2016-04-12 DIAGNOSIS — E1129 Type 2 diabetes mellitus with other diabetic kidney complication: Secondary | ICD-10-CM | POA: Diagnosis not present

## 2016-04-12 DIAGNOSIS — N186 End stage renal disease: Secondary | ICD-10-CM | POA: Diagnosis not present

## 2016-04-12 DIAGNOSIS — D631 Anemia in chronic kidney disease: Secondary | ICD-10-CM | POA: Diagnosis not present

## 2016-04-14 DIAGNOSIS — D631 Anemia in chronic kidney disease: Secondary | ICD-10-CM | POA: Diagnosis not present

## 2016-04-14 DIAGNOSIS — E1129 Type 2 diabetes mellitus with other diabetic kidney complication: Secondary | ICD-10-CM | POA: Diagnosis not present

## 2016-04-14 DIAGNOSIS — N2581 Secondary hyperparathyroidism of renal origin: Secondary | ICD-10-CM | POA: Diagnosis not present

## 2016-04-14 DIAGNOSIS — N186 End stage renal disease: Secondary | ICD-10-CM | POA: Diagnosis not present

## 2016-04-16 DIAGNOSIS — N186 End stage renal disease: Secondary | ICD-10-CM | POA: Diagnosis not present

## 2016-04-16 DIAGNOSIS — N2581 Secondary hyperparathyroidism of renal origin: Secondary | ICD-10-CM | POA: Diagnosis not present

## 2016-04-16 DIAGNOSIS — E1129 Type 2 diabetes mellitus with other diabetic kidney complication: Secondary | ICD-10-CM | POA: Diagnosis not present

## 2016-04-16 DIAGNOSIS — D631 Anemia in chronic kidney disease: Secondary | ICD-10-CM | POA: Diagnosis not present

## 2016-04-19 DIAGNOSIS — E1129 Type 2 diabetes mellitus with other diabetic kidney complication: Secondary | ICD-10-CM | POA: Diagnosis not present

## 2016-04-19 DIAGNOSIS — D631 Anemia in chronic kidney disease: Secondary | ICD-10-CM | POA: Diagnosis not present

## 2016-04-19 DIAGNOSIS — N2581 Secondary hyperparathyroidism of renal origin: Secondary | ICD-10-CM | POA: Diagnosis not present

## 2016-04-19 DIAGNOSIS — N186 End stage renal disease: Secondary | ICD-10-CM | POA: Diagnosis not present

## 2016-04-22 DIAGNOSIS — E1129 Type 2 diabetes mellitus with other diabetic kidney complication: Secondary | ICD-10-CM | POA: Diagnosis not present

## 2016-04-22 DIAGNOSIS — N186 End stage renal disease: Secondary | ICD-10-CM | POA: Diagnosis not present

## 2016-04-22 DIAGNOSIS — N2581 Secondary hyperparathyroidism of renal origin: Secondary | ICD-10-CM | POA: Diagnosis not present

## 2016-04-22 DIAGNOSIS — D631 Anemia in chronic kidney disease: Secondary | ICD-10-CM | POA: Diagnosis not present

## 2016-04-23 ENCOUNTER — Emergency Department (HOSPITAL_BASED_OUTPATIENT_CLINIC_OR_DEPARTMENT_OTHER): Payer: Medicare Other

## 2016-04-23 ENCOUNTER — Emergency Department (HOSPITAL_BASED_OUTPATIENT_CLINIC_OR_DEPARTMENT_OTHER)
Admission: EM | Admit: 2016-04-23 | Discharge: 2016-04-23 | Disposition: A | Discharge status: Home / Self Care | Payer: Medicare Other | Attending: Emergency Medicine | Admitting: Emergency Medicine

## 2016-04-23 ENCOUNTER — Encounter (HOSPITAL_BASED_OUTPATIENT_CLINIC_OR_DEPARTMENT_OTHER): Payer: Self-pay

## 2016-04-23 DIAGNOSIS — M25571 Pain in right ankle and joints of right foot: Secondary | ICD-10-CM | POA: Diagnosis not present

## 2016-04-23 DIAGNOSIS — Y939 Activity, unspecified: Secondary | ICD-10-CM | POA: Insufficient documentation

## 2016-04-23 DIAGNOSIS — Z79899 Other long term (current) drug therapy: Secondary | ICD-10-CM | POA: Insufficient documentation

## 2016-04-23 DIAGNOSIS — I2510 Atherosclerotic heart disease of native coronary artery without angina pectoris: Secondary | ICD-10-CM | POA: Diagnosis not present

## 2016-04-23 DIAGNOSIS — M79604 Pain in right leg: Secondary | ICD-10-CM | POA: Diagnosis not present

## 2016-04-23 DIAGNOSIS — N186 End stage renal disease: Secondary | ICD-10-CM | POA: Insufficient documentation

## 2016-04-23 DIAGNOSIS — Z992 Dependence on renal dialysis: Secondary | ICD-10-CM | POA: Diagnosis not present

## 2016-04-23 DIAGNOSIS — Z87891 Personal history of nicotine dependence: Secondary | ICD-10-CM | POA: Diagnosis not present

## 2016-04-23 DIAGNOSIS — K652 Spontaneous bacterial peritonitis: Secondary | ICD-10-CM | POA: Diagnosis not present

## 2016-04-23 DIAGNOSIS — Y999 Unspecified external cause status: Secondary | ICD-10-CM | POA: Diagnosis not present

## 2016-04-23 DIAGNOSIS — R188 Other ascites: Secondary | ICD-10-CM | POA: Diagnosis not present

## 2016-04-23 DIAGNOSIS — E1122 Type 2 diabetes mellitus with diabetic chronic kidney disease: Secondary | ICD-10-CM | POA: Diagnosis not present

## 2016-04-23 DIAGNOSIS — W010XXA Fall on same level from slipping, tripping and stumbling without subsequent striking against object, initial encounter: Secondary | ICD-10-CM | POA: Insufficient documentation

## 2016-04-23 DIAGNOSIS — S8991XA Unspecified injury of right lower leg, initial encounter: Secondary | ICD-10-CM | POA: Diagnosis not present

## 2016-04-23 DIAGNOSIS — R600 Localized edema: Secondary | ICD-10-CM | POA: Insufficient documentation

## 2016-04-23 DIAGNOSIS — Y92009 Unspecified place in unspecified non-institutional (private) residence as the place of occurrence of the external cause: Secondary | ICD-10-CM | POA: Insufficient documentation

## 2016-04-23 DIAGNOSIS — I5033 Acute on chronic diastolic (congestive) heart failure: Secondary | ICD-10-CM | POA: Diagnosis not present

## 2016-04-23 DIAGNOSIS — M7989 Other specified soft tissue disorders: Secondary | ICD-10-CM | POA: Diagnosis not present

## 2016-04-23 DIAGNOSIS — Z794 Long term (current) use of insulin: Secondary | ICD-10-CM | POA: Insufficient documentation

## 2016-04-23 DIAGNOSIS — M25561 Pain in right knee: Secondary | ICD-10-CM | POA: Diagnosis not present

## 2016-04-23 DIAGNOSIS — I132 Hypertensive heart and chronic kidney disease with heart failure and with stage 5 chronic kidney disease, or end stage renal disease: Secondary | ICD-10-CM | POA: Insufficient documentation

## 2016-04-23 DIAGNOSIS — R52 Pain, unspecified: Secondary | ICD-10-CM

## 2016-04-23 NOTE — ED Triage Notes (Signed)
Pt states her blood sugar dropped approx 2pm today-she fell-pain to right knee-presents to triage in w/c

## 2016-04-23 NOTE — Discharge Instructions (Signed)

## 2016-04-23 NOTE — ED Provider Notes (Signed)
Emergency Department Provider Note   I have reviewed the triage vital signs and the nursing notes.  By signing my name below, I, Georgette Shell, attest that this documentation has been prepared under the direction and in the presence of Margette Fast, MD. Electronically Signed: Georgette Shell, ED Scribe. 04/23/16. 10:23 PM.   HISTORY  Chief Complaint Fall  HPI Comments: XINYI SHADDOCK is a 40 y.o. female with h/o anemia, CAD, IDDM, end stage renal disease, and HTN who presents to the Emergency Department complaining of 8/10 right knee pain radiating down to her foot with swelling s/p mechanical fall ~2 pm today. Pt reports her blood sugar dropped and caused her to trip and fall; however, pt does not remember what exactly happened. No LOC. Pt denies head injury. Pain is exacerbated with ambulating and movement. She has not tried any OTC medications PTA. Pt denies any additional injuries. She further denies fever, chills, or any other associated symptoms.    Past Medical History:  Diagnosis Date  . Chronic anemia    2nd to renal disease  . CIN III (cervical intraepithelial neoplasia grade III) with severe dysplasia    S/P LEEP AND CONE  . Coronary artery disease    Status post cardiac catheterization June 2012 scattered coronary artery disease/atherosclerosis with 70-80% stenosis in a small right PDA.  . 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 DM (diabetes mellitus) ([place])    Long-term insulin  . End stage renal disease on dialysis ([place]) 05/02/11   [facility]; M; W, F; last time 05/01/11  . Fracture of 5th metatarsal 2016   Right  . Gastroparesis   . 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 History of abscesses in groins 12/06/2010  . Hyperlipidemia    Hypertriglyceridemia 449 HDL 25  . Hypertension   . Migraines    "just on dialysis days"  . Peripheral neuropathy (HCC)    related to DM  . Peripheral vascular disease ([place])    Tibial occlusive disease evaluated by Dr. Kellie Simmering in August 2011. Medical  therapy  . Renal insufficiency    Dialysis since 2012  . Tobacco use disorder    Discontinued March 2012  . Trimalleolar fracture of ankle, closed 02/09/2015   Right    Patient Active Problem List   Diagnosis Date Noted  . Acute on chronic diastolic CHF (congestive heart failure) ([place]) 02/22/2016  . Fluid overload   . Pericardial effusion   . Ascites 02/15/2016  . DM (diabetes mellitus), type 2 with renal complications ([place]) [id number]  . Pain   . Bimalleolar ankle fracture 02/12/2015  . CAD (coronary artery disease) 02/09/2015  . Peripheral vascular disease ([place])   . Gastroparesis   . Chronic anemia   . Menorrhagia with regular cycle 01/04/2014  . Dysmenorrhea 01/04/2014  . Nausea & vomiting 10/16/2011  . History of noncompliance with medical treatment 05/09/2011  . Chronic UTI 05/09/2011  . CIN III (cervical intraepithelial neoplasia grade III) with severe dysplasia   . History of abscesses in groins 12/06/2010  . End stage renal disease on dialysis ([place])   . Coronary artery disease   . Hyperlipidemia   . 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 NONDEPENDENT TOBACCO USE DISORDER 12/20/2009  . Essential hypertension, benign 12/20/2009  . NEPHROTIC SYNDROME 12/20/2009  . Shortness of breath 12/20/2009  . Chest pain 12/20/2009    Past Surgical History:  Procedure Laterality Date  . AV FISTULA PLACEMENT  04/2010  . CARDIAC CATHETERIZATION [name] 02/10/2015  Procedure: Left Heart Cath and Coronary Angiography;  Surgeon: Dixie Dials, MD;  Location: [facility];  Service: Cardiovascular;  Laterality: [name];  . CARDIAC CATHETERIZATION [name] 02/21/2016   Procedure: Right Heart Cath;  Surgeon: Jolaine Artist, MD;  Location: [facility];  Service: Cardiovascular;  Laterality: [name];  . CERVICAL BIOPSY  W/ LOOP ELECTRODE EXCISION     h/o  . CERVICAL CONE BIOPSY     h/o  . COLPOSCOPY    . DILATION AND CURETTAGE OF UTERUS  2009  . ORIF ANKLE FRACTURE Right 02/09/2015  . ORIF  ANKLE FRACTURE Right 02/16/2015   Procedure: OPEN REDUCTION INTERNAL FIXATION (ORIF) RIGHT ANKLE FRACTURE;  Surgeon: Altamese McLean, MD;  Location: [facility];  Service: Orthopedics;  Laterality: Right;    Current Outpatient Rx  . Order #: [id number] Class: Normal  . Order #: [id number] Class: Normal  . Order #: [id number] Class: Historical Med  . Order #: [id number] Class: Normal  . Order #: [id number] Class: Historical Med  . Order #: [id number] Class: Historical Med  . Order #: [id number] Class: Historical Med  . Order #: [id number] Class: Normal  . Order #: [id number] Class: Normal  . Order #: [id number] Class: Normal  . Order #: [id number] Class: Historical Med  . Order #: [id number] Class: Print    Allergies Cephalexin and Sulfamethoxazole-trimethoprim  Family History  Problem Relation Age of Onset  . Diabetes Mother   . Hypertension Mother   . Diabetes Father   . Hyperlipidemia Father   . Hypertension Father   . Diabetes Sister   . Stroke Maternal Grandmother   . Diabetes Sister   . Breast cancer Maternal Aunt     Age 67's    Social History Social History  Substance Use Topics  . Smoking status: Former Smoker    Packs/day: 0.30    Years: 10.00    Types: Cigarettes    Quit date: 08/05/2011  . Smokeless tobacco: Never Used     Comment: "quit smoking cigarettes 07/2010"  . Alcohol use No    Review of Systems Constitutional: No fever/chills Eyes: No visual changes. ENT: No sore throat. Cardiovascular: Denies chest pain. Respiratory: Denies shortness of breath. Gastrointestinal: No abdominal pain.  No nausea, no vomiting.  No diarrhea.  No constipation. Genitourinary: Negative for dysuria. Musculoskeletal: Negative for back pain. Diffuse right leg pain.  Skin: Negative for rash. Neurological: Negative for headaches, focal weakness or numbness. 10-point ROS otherwise negative.  ____________________________________________   PHYSICAL EXAM:  VITAL SIGNS: ED Triage Vitals  Enc Vitals  Group     BP 04/23/16 2116 192/82     Pulse Rate 04/23/16 2116 75     Resp 04/23/16 2116 20     Temp 04/23/16 2116 97.8 F (36.6 C)     Temp Source 04/23/16 2116 Oral     SpO2 04/23/16 2116 94 %     Weight 04/23/16 2116 200 lb (90.7 kg)     Height 04/23/16 2116 6\' 4"  (1.93 m)     Pain Score 04/23/16 2114 8   Constitutional: Alert and oriented. Well appearing and in no acute distress. Eyes: Conjunctivae are normal.  Head: Atraumatic. Nose: No congestion/rhinnorhea. Mouth/Throat: Mucous membranes are moist.  Neck: No stridor.   Cardiovascular: Normal rate, regular rhythm. Good peripheral circulation. Grossly normal heart sounds.   Respiratory: Normal respiratory effort.  No retractions. Lungs CTAB. Gastrointestinal: Soft and nontender. No distention.  Musculoskeletal: Diffuse LE edema from knee to ankle. No swelling or ecchymosis.  Neurologic:  Normal speech and language. No  gross focal neurologic deficits are appreciated.  Skin:  Skin is warm, dry and intact. No rash noted.  ____________________________________________  RADIOLOGY  Dg Ankle Complete Right  Result Date: 04/23/2016 CLINICAL DATA:  Anterior and lateral malleolar pain after fall EXAM: RIGHT ANKLE - COMPLETE 3+ VIEW COMPARISON:  03/30/2015 ankle radiographs FINDINGS: Bimalleolar plate and screw fixation without evidence of hardware failure, loosening nor bone destruction. No acute osseous abnormality is noted. There is mild soft tissue swelling about the malleoli. Old fracture deformity of the distal fifth metatarsal shaft. Vascular calcifications along the dorsalis pedis posterior tibial arteries. Plantar and dorsal calcaneal enthesophytes are seen. IMPRESSION: Soft tissue swelling of the right ankle without acute osseous abnormality. Old fifth metatarsal shaft fracture with healing. Intact bimalleolar fixation hardware. Calcaneal enthesophytes. Electronically Signed   By: Ashley Royalty M.D.   On: 04/23/2016 23:31   Dg Knee  Complete 4 Views Right  Result Date: 04/23/2016 CLINICAL DATA:  40 y/o F; status post fall with pain over the lateral aspect of right thin and inability to stand. EXAM: RIGHT KNEE - COMPLETE 4+ VIEW COMPARISON:  02/13/2015 right knee radiographs. FINDINGS: No acute fracture, dislocation, or joint effusion identified. Faint linear density within the lateral compartment may represent chondrocalcinosis. Extensive vascular calcification. IMPRESSION: No acute fracture or dislocation identified. Electronically Signed   By: Kristine Garbe M.D.   On: 04/23/2016 21:59    ____________________________________________   PROCEDURES  Procedure(s) performed:   Procedures  None ____________________________________________   INITIAL IMPRESSION / ASSESSMENT AND PLAN / ED COURSE  Pertinent labs & imaging results that were available during my care of the patient were reviewed by me and considered in my medical decision making (see chart for details).  Patient presents to the ED with right leg pain after fall at home. Patient had episode of low BS. Awake and alert here. No prodromal pre-syncope symptoms to suggest cardiac etiology. Plain films of knee and ankle are negative. Patient does not want crutches. Will apply air cast for comfort. Discussed PCP follow up and return precautions in detail.   At this time, I do not feel there is any life-threatening condition present. I have reviewed and discussed all results (EKG, imaging, lab, urine as appropriate), exam findings with patient. I have reviewed nursing notes and appropriate previous records.  I feel the patient is safe to be discharged home without further emergent workup. Discussed usual and customary return precautions. Patient and family (if present) verbalize understanding and are comfortable with this plan.  Patient will follow-up with their primary care provider. If they do not have a primary care provider, information for follow-up has  been provided to them. All questions have been answered.  ____________________________________________  FINAL CLINICAL IMPRESSION(S) / ED DIAGNOSES  Final diagnoses:  Right leg pain     MEDICATIONS GIVEN DURING THIS VISIT:  None  NEW OUTPATIENT MEDICATIONS STARTED DURING THIS VISIT:  NONE  I personally performed the services described in this documentation, which was scribed in my presence. The recorded information has been reviewed and is accurate.    Note:  This document was prepared using Dragon voice recognition software and may include unintentional dictation errors.  Nanda Quinton, MD Emergency Medicine    Margette Fast, MD 04/24/16 [id number]

## 2016-04-23 NOTE — ED Notes (Signed)
PA notified that pt is in a lot of pain.  Requested pain med.  No orders received.

## 2016-04-24 DIAGNOSIS — N186 End stage renal disease: Secondary | ICD-10-CM | POA: Diagnosis not present

## 2016-04-24 DIAGNOSIS — D631 Anemia in chronic kidney disease: Secondary | ICD-10-CM | POA: Diagnosis not present

## 2016-04-24 DIAGNOSIS — N2581 Secondary hyperparathyroidism of renal origin: Secondary | ICD-10-CM | POA: Diagnosis not present

## 2016-04-24 DIAGNOSIS — E1129 Type 2 diabetes mellitus with other diabetic kidney complication: Secondary | ICD-10-CM | POA: Diagnosis not present

## 2016-04-25 DIAGNOSIS — E1129 Type 2 diabetes mellitus with other diabetic kidney complication: Secondary | ICD-10-CM | POA: Diagnosis not present

## 2016-04-25 DIAGNOSIS — Z992 Dependence on renal dialysis: Secondary | ICD-10-CM | POA: Diagnosis not present

## 2016-04-25 DIAGNOSIS — N186 End stage renal disease: Secondary | ICD-10-CM | POA: Diagnosis not present

## 2016-04-26 DIAGNOSIS — E1129 Type 2 diabetes mellitus with other diabetic kidney complication: Secondary | ICD-10-CM | POA: Diagnosis not present

## 2016-04-26 DIAGNOSIS — N186 End stage renal disease: Secondary | ICD-10-CM | POA: Diagnosis not present

## 2016-04-26 DIAGNOSIS — D631 Anemia in chronic kidney disease: Secondary | ICD-10-CM | POA: Diagnosis not present

## 2016-04-26 DIAGNOSIS — N2581 Secondary hyperparathyroidism of renal origin: Secondary | ICD-10-CM | POA: Diagnosis not present

## 2016-04-26 DIAGNOSIS — E875 Hyperkalemia: Secondary | ICD-10-CM | POA: Diagnosis not present

## 2016-04-29 DIAGNOSIS — E875 Hyperkalemia: Secondary | ICD-10-CM | POA: Diagnosis not present

## 2016-04-29 DIAGNOSIS — D631 Anemia in chronic kidney disease: Secondary | ICD-10-CM | POA: Diagnosis not present

## 2016-04-29 DIAGNOSIS — N186 End stage renal disease: Secondary | ICD-10-CM | POA: Diagnosis not present

## 2016-04-29 DIAGNOSIS — E1129 Type 2 diabetes mellitus with other diabetic kidney complication: Secondary | ICD-10-CM | POA: Diagnosis not present

## 2016-04-29 DIAGNOSIS — N2581 Secondary hyperparathyroidism of renal origin: Secondary | ICD-10-CM | POA: Diagnosis not present

## 2016-04-30 DIAGNOSIS — Z992 Dependence on renal dialysis: Secondary | ICD-10-CM | POA: Diagnosis not present

## 2016-04-30 DIAGNOSIS — T82848A Pain from vascular prosthetic devices, implants and grafts, initial encounter: Secondary | ICD-10-CM | POA: Diagnosis not present

## 2016-04-30 DIAGNOSIS — T82898A Other specified complication of vascular prosthetic devices, implants and grafts, initial encounter: Secondary | ICD-10-CM | POA: Diagnosis not present

## 2016-04-30 DIAGNOSIS — N186 End stage renal disease: Secondary | ICD-10-CM | POA: Diagnosis not present

## 2016-05-01 DIAGNOSIS — D631 Anemia in chronic kidney disease: Secondary | ICD-10-CM | POA: Diagnosis not present

## 2016-05-01 DIAGNOSIS — E875 Hyperkalemia: Secondary | ICD-10-CM | POA: Diagnosis not present

## 2016-05-01 DIAGNOSIS — N2581 Secondary hyperparathyroidism of renal origin: Secondary | ICD-10-CM | POA: Diagnosis not present

## 2016-05-01 DIAGNOSIS — N186 End stage renal disease: Secondary | ICD-10-CM | POA: Diagnosis not present

## 2016-05-01 DIAGNOSIS — E1129 Type 2 diabetes mellitus with other diabetic kidney complication: Secondary | ICD-10-CM | POA: Diagnosis not present

## 2016-05-03 DIAGNOSIS — D631 Anemia in chronic kidney disease: Secondary | ICD-10-CM | POA: Diagnosis not present

## 2016-05-03 DIAGNOSIS — E875 Hyperkalemia: Secondary | ICD-10-CM | POA: Diagnosis not present

## 2016-05-03 DIAGNOSIS — N186 End stage renal disease: Secondary | ICD-10-CM | POA: Diagnosis not present

## 2016-05-03 DIAGNOSIS — N2581 Secondary hyperparathyroidism of renal origin: Secondary | ICD-10-CM | POA: Diagnosis not present

## 2016-05-03 DIAGNOSIS — E1129 Type 2 diabetes mellitus with other diabetic kidney complication: Secondary | ICD-10-CM | POA: Diagnosis not present

## 2016-05-06 DIAGNOSIS — N186 End stage renal disease: Secondary | ICD-10-CM | POA: Diagnosis not present

## 2016-05-06 DIAGNOSIS — E1129 Type 2 diabetes mellitus with other diabetic kidney complication: Secondary | ICD-10-CM | POA: Diagnosis not present

## 2016-05-06 DIAGNOSIS — D631 Anemia in chronic kidney disease: Secondary | ICD-10-CM | POA: Diagnosis not present

## 2016-05-06 DIAGNOSIS — N2581 Secondary hyperparathyroidism of renal origin: Secondary | ICD-10-CM | POA: Diagnosis not present

## 2016-05-06 DIAGNOSIS — E875 Hyperkalemia: Secondary | ICD-10-CM | POA: Diagnosis not present

## 2016-05-08 DIAGNOSIS — N186 End stage renal disease: Secondary | ICD-10-CM | POA: Diagnosis not present

## 2016-05-08 DIAGNOSIS — E1129 Type 2 diabetes mellitus with other diabetic kidney complication: Secondary | ICD-10-CM | POA: Diagnosis not present

## 2016-05-08 DIAGNOSIS — E875 Hyperkalemia: Secondary | ICD-10-CM | POA: Diagnosis not present

## 2016-05-08 DIAGNOSIS — D631 Anemia in chronic kidney disease: Secondary | ICD-10-CM | POA: Diagnosis not present

## 2016-05-08 DIAGNOSIS — N2581 Secondary hyperparathyroidism of renal origin: Secondary | ICD-10-CM | POA: Diagnosis not present

## 2016-05-10 DIAGNOSIS — E875 Hyperkalemia: Secondary | ICD-10-CM | POA: Diagnosis not present

## 2016-05-10 DIAGNOSIS — N2581 Secondary hyperparathyroidism of renal origin: Secondary | ICD-10-CM | POA: Diagnosis not present

## 2016-05-10 DIAGNOSIS — E1129 Type 2 diabetes mellitus with other diabetic kidney complication: Secondary | ICD-10-CM | POA: Diagnosis not present

## 2016-05-10 DIAGNOSIS — D631 Anemia in chronic kidney disease: Secondary | ICD-10-CM | POA: Diagnosis not present

## 2016-05-10 DIAGNOSIS — N186 End stage renal disease: Secondary | ICD-10-CM | POA: Diagnosis not present

## 2016-05-13 DIAGNOSIS — E1129 Type 2 diabetes mellitus with other diabetic kidney complication: Secondary | ICD-10-CM | POA: Diagnosis not present

## 2016-05-13 DIAGNOSIS — N186 End stage renal disease: Secondary | ICD-10-CM | POA: Diagnosis not present

## 2016-05-13 DIAGNOSIS — E875 Hyperkalemia: Secondary | ICD-10-CM | POA: Diagnosis not present

## 2016-05-13 DIAGNOSIS — D631 Anemia in chronic kidney disease: Secondary | ICD-10-CM | POA: Diagnosis not present

## 2016-05-13 DIAGNOSIS — N2581 Secondary hyperparathyroidism of renal origin: Secondary | ICD-10-CM | POA: Diagnosis not present

## 2016-05-14 DIAGNOSIS — K652 Spontaneous bacterial peritonitis: Secondary | ICD-10-CM | POA: Diagnosis not present

## 2016-05-14 DIAGNOSIS — E1065 Type 1 diabetes mellitus with hyperglycemia: Secondary | ICD-10-CM | POA: Diagnosis not present

## 2016-05-15 DIAGNOSIS — N186 End stage renal disease: Secondary | ICD-10-CM | POA: Diagnosis not present

## 2016-05-15 DIAGNOSIS — D631 Anemia in chronic kidney disease: Secondary | ICD-10-CM | POA: Diagnosis not present

## 2016-05-15 DIAGNOSIS — E875 Hyperkalemia: Secondary | ICD-10-CM | POA: Diagnosis not present

## 2016-05-15 DIAGNOSIS — N2581 Secondary hyperparathyroidism of renal origin: Secondary | ICD-10-CM | POA: Diagnosis not present

## 2016-05-15 DIAGNOSIS — E1129 Type 2 diabetes mellitus with other diabetic kidney complication: Secondary | ICD-10-CM | POA: Diagnosis not present

## 2016-05-17 DIAGNOSIS — E875 Hyperkalemia: Secondary | ICD-10-CM | POA: Diagnosis not present

## 2016-05-17 DIAGNOSIS — E1129 Type 2 diabetes mellitus with other diabetic kidney complication: Secondary | ICD-10-CM | POA: Diagnosis not present

## 2016-05-17 DIAGNOSIS — N2581 Secondary hyperparathyroidism of renal origin: Secondary | ICD-10-CM | POA: Diagnosis not present

## 2016-05-17 DIAGNOSIS — D631 Anemia in chronic kidney disease: Secondary | ICD-10-CM | POA: Diagnosis not present

## 2016-05-17 DIAGNOSIS — N186 End stage renal disease: Secondary | ICD-10-CM | POA: Diagnosis not present

## 2016-05-19 DIAGNOSIS — D631 Anemia in chronic kidney disease: Secondary | ICD-10-CM | POA: Diagnosis not present

## 2016-05-19 DIAGNOSIS — N2581 Secondary hyperparathyroidism of renal origin: Secondary | ICD-10-CM | POA: Diagnosis not present

## 2016-05-19 DIAGNOSIS — N186 End stage renal disease: Secondary | ICD-10-CM | POA: Diagnosis not present

## 2016-05-19 DIAGNOSIS — E1129 Type 2 diabetes mellitus with other diabetic kidney complication: Secondary | ICD-10-CM | POA: Diagnosis not present

## 2016-05-19 DIAGNOSIS — E875 Hyperkalemia: Secondary | ICD-10-CM | POA: Diagnosis not present

## 2016-05-22 DIAGNOSIS — E875 Hyperkalemia: Secondary | ICD-10-CM | POA: Diagnosis not present

## 2016-05-22 DIAGNOSIS — E1129 Type 2 diabetes mellitus with other diabetic kidney complication: Secondary | ICD-10-CM | POA: Diagnosis not present

## 2016-05-22 DIAGNOSIS — D631 Anemia in chronic kidney disease: Secondary | ICD-10-CM | POA: Diagnosis not present

## 2016-05-22 DIAGNOSIS — N186 End stage renal disease: Secondary | ICD-10-CM | POA: Diagnosis not present

## 2016-05-22 DIAGNOSIS — N2581 Secondary hyperparathyroidism of renal origin: Secondary | ICD-10-CM | POA: Diagnosis not present

## 2016-05-24 DIAGNOSIS — E1129 Type 2 diabetes mellitus with other diabetic kidney complication: Secondary | ICD-10-CM | POA: Diagnosis not present

## 2016-05-24 DIAGNOSIS — N2581 Secondary hyperparathyroidism of renal origin: Secondary | ICD-10-CM | POA: Diagnosis not present

## 2016-05-24 DIAGNOSIS — N186 End stage renal disease: Secondary | ICD-10-CM | POA: Diagnosis not present

## 2016-05-24 DIAGNOSIS — D631 Anemia in chronic kidney disease: Secondary | ICD-10-CM | POA: Diagnosis not present

## 2016-05-24 DIAGNOSIS — E875 Hyperkalemia: Secondary | ICD-10-CM | POA: Diagnosis not present

## 2016-05-26 DIAGNOSIS — N186 End stage renal disease: Secondary | ICD-10-CM | POA: Diagnosis not present

## 2016-05-26 DIAGNOSIS — D631 Anemia in chronic kidney disease: Secondary | ICD-10-CM | POA: Diagnosis not present

## 2016-05-26 DIAGNOSIS — E875 Hyperkalemia: Secondary | ICD-10-CM | POA: Diagnosis not present

## 2016-05-26 DIAGNOSIS — E1129 Type 2 diabetes mellitus with other diabetic kidney complication: Secondary | ICD-10-CM | POA: Diagnosis not present

## 2016-05-26 DIAGNOSIS — N2581 Secondary hyperparathyroidism of renal origin: Secondary | ICD-10-CM | POA: Diagnosis not present

## 2016-05-26 DIAGNOSIS — Z992 Dependence on renal dialysis: Secondary | ICD-10-CM | POA: Diagnosis not present

## 2016-05-29 DIAGNOSIS — D631 Anemia in chronic kidney disease: Secondary | ICD-10-CM | POA: Diagnosis not present

## 2016-05-29 DIAGNOSIS — N186 End stage renal disease: Secondary | ICD-10-CM | POA: Diagnosis not present

## 2016-05-29 DIAGNOSIS — N2581 Secondary hyperparathyroidism of renal origin: Secondary | ICD-10-CM | POA: Diagnosis not present

## 2016-05-29 DIAGNOSIS — E875 Hyperkalemia: Secondary | ICD-10-CM | POA: Diagnosis not present

## 2016-05-31 DIAGNOSIS — N2581 Secondary hyperparathyroidism of renal origin: Secondary | ICD-10-CM | POA: Diagnosis not present

## 2016-05-31 DIAGNOSIS — D631 Anemia in chronic kidney disease: Secondary | ICD-10-CM | POA: Diagnosis not present

## 2016-05-31 DIAGNOSIS — N186 End stage renal disease: Secondary | ICD-10-CM | POA: Diagnosis not present

## 2016-05-31 DIAGNOSIS — E875 Hyperkalemia: Secondary | ICD-10-CM | POA: Diagnosis not present

## 2016-06-03 DIAGNOSIS — N2581 Secondary hyperparathyroidism of renal origin: Secondary | ICD-10-CM | POA: Diagnosis not present

## 2016-06-03 DIAGNOSIS — N186 End stage renal disease: Secondary | ICD-10-CM | POA: Diagnosis not present

## 2016-06-03 DIAGNOSIS — E875 Hyperkalemia: Secondary | ICD-10-CM | POA: Diagnosis not present

## 2016-06-03 DIAGNOSIS — D631 Anemia in chronic kidney disease: Secondary | ICD-10-CM | POA: Diagnosis not present

## 2016-06-05 DIAGNOSIS — I10 Essential (primary) hypertension: Secondary | ICD-10-CM | POA: Diagnosis not present

## 2016-06-05 DIAGNOSIS — I2510 Atherosclerotic heart disease of native coronary artery without angina pectoris: Secondary | ICD-10-CM | POA: Diagnosis not present

## 2016-06-05 DIAGNOSIS — E875 Hyperkalemia: Secondary | ICD-10-CM | POA: Diagnosis not present

## 2016-06-05 DIAGNOSIS — N186 End stage renal disease: Secondary | ICD-10-CM | POA: Diagnosis not present

## 2016-06-05 DIAGNOSIS — I120 Hypertensive chronic kidney disease with stage 5 chronic kidney disease or end stage renal disease: Secondary | ICD-10-CM | POA: Diagnosis not present

## 2016-06-05 DIAGNOSIS — N2581 Secondary hyperparathyroidism of renal origin: Secondary | ICD-10-CM | POA: Diagnosis not present

## 2016-06-05 DIAGNOSIS — D631 Anemia in chronic kidney disease: Secondary | ICD-10-CM | POA: Diagnosis not present

## 2016-06-06 DIAGNOSIS — I2510 Atherosclerotic heart disease of native coronary artery without angina pectoris: Secondary | ICD-10-CM | POA: Diagnosis not present

## 2016-06-06 DIAGNOSIS — I5032 Chronic diastolic (congestive) heart failure: Secondary | ICD-10-CM | POA: Diagnosis not present

## 2016-06-06 DIAGNOSIS — I110 Hypertensive heart disease with heart failure: Secondary | ICD-10-CM | POA: Diagnosis not present

## 2016-06-06 DIAGNOSIS — N186 End stage renal disease: Secondary | ICD-10-CM | POA: Diagnosis not present

## 2016-06-07 DIAGNOSIS — N2581 Secondary hyperparathyroidism of renal origin: Secondary | ICD-10-CM | POA: Diagnosis not present

## 2016-06-07 DIAGNOSIS — D631 Anemia in chronic kidney disease: Secondary | ICD-10-CM | POA: Diagnosis not present

## 2016-06-07 DIAGNOSIS — N186 End stage renal disease: Secondary | ICD-10-CM | POA: Diagnosis not present

## 2016-06-07 DIAGNOSIS — E875 Hyperkalemia: Secondary | ICD-10-CM | POA: Diagnosis not present

## 2016-06-10 DIAGNOSIS — N186 End stage renal disease: Secondary | ICD-10-CM | POA: Diagnosis not present

## 2016-06-10 DIAGNOSIS — E875 Hyperkalemia: Secondary | ICD-10-CM | POA: Diagnosis not present

## 2016-06-10 DIAGNOSIS — D631 Anemia in chronic kidney disease: Secondary | ICD-10-CM | POA: Diagnosis not present

## 2016-06-10 DIAGNOSIS — N2581 Secondary hyperparathyroidism of renal origin: Secondary | ICD-10-CM | POA: Diagnosis not present

## 2016-06-12 DIAGNOSIS — N186 End stage renal disease: Secondary | ICD-10-CM | POA: Diagnosis not present

## 2016-06-12 DIAGNOSIS — N2581 Secondary hyperparathyroidism of renal origin: Secondary | ICD-10-CM | POA: Diagnosis not present

## 2016-06-12 DIAGNOSIS — E875 Hyperkalemia: Secondary | ICD-10-CM | POA: Diagnosis not present

## 2016-06-12 DIAGNOSIS — D631 Anemia in chronic kidney disease: Secondary | ICD-10-CM | POA: Diagnosis not present

## 2016-06-12 DIAGNOSIS — I120 Hypertensive chronic kidney disease with stage 5 chronic kidney disease or end stage renal disease: Secondary | ICD-10-CM | POA: Diagnosis not present

## 2016-06-14 DIAGNOSIS — N2581 Secondary hyperparathyroidism of renal origin: Secondary | ICD-10-CM | POA: Diagnosis not present

## 2016-06-14 DIAGNOSIS — N186 End stage renal disease: Secondary | ICD-10-CM | POA: Diagnosis not present

## 2016-06-14 DIAGNOSIS — E875 Hyperkalemia: Secondary | ICD-10-CM | POA: Diagnosis not present

## 2016-06-14 DIAGNOSIS — D631 Anemia in chronic kidney disease: Secondary | ICD-10-CM | POA: Diagnosis not present

## 2016-06-17 DIAGNOSIS — E875 Hyperkalemia: Secondary | ICD-10-CM | POA: Diagnosis not present

## 2016-06-17 DIAGNOSIS — N2581 Secondary hyperparathyroidism of renal origin: Secondary | ICD-10-CM | POA: Diagnosis not present

## 2016-06-17 DIAGNOSIS — D631 Anemia in chronic kidney disease: Secondary | ICD-10-CM | POA: Diagnosis not present

## 2016-06-17 DIAGNOSIS — N186 End stage renal disease: Secondary | ICD-10-CM | POA: Diagnosis not present

## 2016-06-19 DIAGNOSIS — N2581 Secondary hyperparathyroidism of renal origin: Secondary | ICD-10-CM | POA: Diagnosis not present

## 2016-06-19 DIAGNOSIS — E875 Hyperkalemia: Secondary | ICD-10-CM | POA: Diagnosis not present

## 2016-06-19 DIAGNOSIS — E1129 Type 2 diabetes mellitus with other diabetic kidney complication: Secondary | ICD-10-CM | POA: Diagnosis not present

## 2016-06-19 DIAGNOSIS — N186 End stage renal disease: Secondary | ICD-10-CM | POA: Diagnosis not present

## 2016-06-19 DIAGNOSIS — D631 Anemia in chronic kidney disease: Secondary | ICD-10-CM | POA: Diagnosis not present

## 2016-06-20 ENCOUNTER — Other Ambulatory Visit (HOSPITAL_COMMUNITY): Payer: Self-pay | Admitting: Internal Medicine

## 2016-06-20 DIAGNOSIS — R1900 Intra-abdominal and pelvic swelling, mass and lump, unspecified site: Secondary | ICD-10-CM

## 2016-06-20 DIAGNOSIS — R109 Unspecified abdominal pain: Secondary | ICD-10-CM

## 2016-06-20 DIAGNOSIS — L814 Other melanin hyperpigmentation: Secondary | ICD-10-CM | POA: Diagnosis not present

## 2016-06-20 DIAGNOSIS — L720 Epidermal cyst: Secondary | ICD-10-CM | POA: Diagnosis not present

## 2016-06-20 DIAGNOSIS — D235 Other benign neoplasm of skin of trunk: Secondary | ICD-10-CM | POA: Diagnosis not present

## 2016-06-21 DIAGNOSIS — E875 Hyperkalemia: Secondary | ICD-10-CM | POA: Diagnosis not present

## 2016-06-21 DIAGNOSIS — N186 End stage renal disease: Secondary | ICD-10-CM | POA: Diagnosis not present

## 2016-06-21 DIAGNOSIS — D631 Anemia in chronic kidney disease: Secondary | ICD-10-CM | POA: Diagnosis not present

## 2016-06-21 DIAGNOSIS — N2581 Secondary hyperparathyroidism of renal origin: Secondary | ICD-10-CM | POA: Diagnosis not present

## 2016-06-24 ENCOUNTER — Ambulatory Visit (HOSPITAL_COMMUNITY)
Admission: RE | Admit: 2016-06-24 | Discharge: 2016-06-24 | Disposition: A | Discharge status: Home / Self Care | Payer: Medicare Other | Source: Ambulatory Visit | Attending: Internal Medicine | Admitting: Internal Medicine

## 2016-06-24 ENCOUNTER — Other Ambulatory Visit (HOSPITAL_COMMUNITY): Payer: Self-pay | Admitting: Internal Medicine

## 2016-06-24 DIAGNOSIS — R109 Unspecified abdominal pain: Secondary | ICD-10-CM | POA: Insufficient documentation

## 2016-06-24 DIAGNOSIS — N186 End stage renal disease: Secondary | ICD-10-CM | POA: Diagnosis not present

## 2016-06-24 DIAGNOSIS — E875 Hyperkalemia: Secondary | ICD-10-CM | POA: Diagnosis not present

## 2016-06-24 DIAGNOSIS — N2581 Secondary hyperparathyroidism of renal origin: Secondary | ICD-10-CM | POA: Diagnosis not present

## 2016-06-24 DIAGNOSIS — R188 Other ascites: Secondary | ICD-10-CM | POA: Insufficient documentation

## 2016-06-24 DIAGNOSIS — D631 Anemia in chronic kidney disease: Secondary | ICD-10-CM | POA: Diagnosis not present

## 2016-06-24 DIAGNOSIS — R1900 Intra-abdominal and pelvic swelling, mass and lump, unspecified site: Secondary | ICD-10-CM

## 2016-06-26 DIAGNOSIS — N186 End stage renal disease: Secondary | ICD-10-CM | POA: Diagnosis not present

## 2016-06-26 DIAGNOSIS — E875 Hyperkalemia: Secondary | ICD-10-CM | POA: Diagnosis not present

## 2016-06-26 DIAGNOSIS — D631 Anemia in chronic kidney disease: Secondary | ICD-10-CM | POA: Diagnosis not present

## 2016-06-26 DIAGNOSIS — Z992 Dependence on renal dialysis: Secondary | ICD-10-CM | POA: Diagnosis not present

## 2016-06-26 DIAGNOSIS — N2581 Secondary hyperparathyroidism of renal origin: Secondary | ICD-10-CM | POA: Diagnosis not present

## 2016-06-26 DIAGNOSIS — E1129 Type 2 diabetes mellitus with other diabetic kidney complication: Secondary | ICD-10-CM | POA: Diagnosis not present

## 2016-06-28 DIAGNOSIS — N186 End stage renal disease: Secondary | ICD-10-CM | POA: Diagnosis not present

## 2016-06-28 DIAGNOSIS — E875 Hyperkalemia: Secondary | ICD-10-CM | POA: Diagnosis not present

## 2016-06-28 DIAGNOSIS — E1129 Type 2 diabetes mellitus with other diabetic kidney complication: Secondary | ICD-10-CM | POA: Diagnosis not present

## 2016-06-28 DIAGNOSIS — N2581 Secondary hyperparathyroidism of renal origin: Secondary | ICD-10-CM | POA: Diagnosis not present

## 2016-06-28 DIAGNOSIS — D631 Anemia in chronic kidney disease: Secondary | ICD-10-CM | POA: Diagnosis not present

## 2016-07-01 DIAGNOSIS — E875 Hyperkalemia: Secondary | ICD-10-CM | POA: Diagnosis not present

## 2016-07-01 DIAGNOSIS — D631 Anemia in chronic kidney disease: Secondary | ICD-10-CM | POA: Diagnosis not present

## 2016-07-01 DIAGNOSIS — E1129 Type 2 diabetes mellitus with other diabetic kidney complication: Secondary | ICD-10-CM | POA: Diagnosis not present

## 2016-07-01 DIAGNOSIS — N2581 Secondary hyperparathyroidism of renal origin: Secondary | ICD-10-CM | POA: Diagnosis not present

## 2016-07-01 DIAGNOSIS — N186 End stage renal disease: Secondary | ICD-10-CM | POA: Diagnosis not present

## 2016-07-03 DIAGNOSIS — E1129 Type 2 diabetes mellitus with other diabetic kidney complication: Secondary | ICD-10-CM | POA: Diagnosis not present

## 2016-07-03 DIAGNOSIS — N186 End stage renal disease: Secondary | ICD-10-CM | POA: Diagnosis not present

## 2016-07-03 DIAGNOSIS — D631 Anemia in chronic kidney disease: Secondary | ICD-10-CM | POA: Diagnosis not present

## 2016-07-03 DIAGNOSIS — N2581 Secondary hyperparathyroidism of renal origin: Secondary | ICD-10-CM | POA: Diagnosis not present

## 2016-07-03 DIAGNOSIS — E875 Hyperkalemia: Secondary | ICD-10-CM | POA: Diagnosis not present

## 2016-07-05 DIAGNOSIS — N186 End stage renal disease: Secondary | ICD-10-CM | POA: Diagnosis not present

## 2016-07-05 DIAGNOSIS — D631 Anemia in chronic kidney disease: Secondary | ICD-10-CM | POA: Diagnosis not present

## 2016-07-05 DIAGNOSIS — N2581 Secondary hyperparathyroidism of renal origin: Secondary | ICD-10-CM | POA: Diagnosis not present

## 2016-07-05 DIAGNOSIS — E875 Hyperkalemia: Secondary | ICD-10-CM | POA: Diagnosis not present

## 2016-07-05 DIAGNOSIS — E1129 Type 2 diabetes mellitus with other diabetic kidney complication: Secondary | ICD-10-CM | POA: Diagnosis not present

## 2016-07-08 DIAGNOSIS — E875 Hyperkalemia: Secondary | ICD-10-CM | POA: Diagnosis not present

## 2016-07-08 DIAGNOSIS — N2581 Secondary hyperparathyroidism of renal origin: Secondary | ICD-10-CM | POA: Diagnosis not present

## 2016-07-08 DIAGNOSIS — H4423 Degenerative myopia, bilateral: Secondary | ICD-10-CM | POA: Diagnosis not present

## 2016-07-08 DIAGNOSIS — E1129 Type 2 diabetes mellitus with other diabetic kidney complication: Secondary | ICD-10-CM | POA: Diagnosis not present

## 2016-07-08 DIAGNOSIS — D631 Anemia in chronic kidney disease: Secondary | ICD-10-CM | POA: Diagnosis not present

## 2016-07-08 DIAGNOSIS — H4921 Sixth [abducent] nerve palsy, right eye: Secondary | ICD-10-CM | POA: Diagnosis not present

## 2016-07-08 DIAGNOSIS — N186 End stage renal disease: Secondary | ICD-10-CM | POA: Diagnosis not present

## 2016-07-10 DIAGNOSIS — E1129 Type 2 diabetes mellitus with other diabetic kidney complication: Secondary | ICD-10-CM | POA: Diagnosis not present

## 2016-07-10 DIAGNOSIS — N2581 Secondary hyperparathyroidism of renal origin: Secondary | ICD-10-CM | POA: Diagnosis not present

## 2016-07-10 DIAGNOSIS — E875 Hyperkalemia: Secondary | ICD-10-CM | POA: Diagnosis not present

## 2016-07-10 DIAGNOSIS — D631 Anemia in chronic kidney disease: Secondary | ICD-10-CM | POA: Diagnosis not present

## 2016-07-10 DIAGNOSIS — N186 End stage renal disease: Secondary | ICD-10-CM | POA: Diagnosis not present

## 2016-07-12 DIAGNOSIS — D631 Anemia in chronic kidney disease: Secondary | ICD-10-CM | POA: Diagnosis not present

## 2016-07-12 DIAGNOSIS — E875 Hyperkalemia: Secondary | ICD-10-CM | POA: Diagnosis not present

## 2016-07-12 DIAGNOSIS — N186 End stage renal disease: Secondary | ICD-10-CM | POA: Diagnosis not present

## 2016-07-12 DIAGNOSIS — N2581 Secondary hyperparathyroidism of renal origin: Secondary | ICD-10-CM | POA: Diagnosis not present

## 2016-07-12 DIAGNOSIS — E1129 Type 2 diabetes mellitus with other diabetic kidney complication: Secondary | ICD-10-CM | POA: Diagnosis not present

## 2016-07-15 DIAGNOSIS — D631 Anemia in chronic kidney disease: Secondary | ICD-10-CM | POA: Diagnosis not present

## 2016-07-15 DIAGNOSIS — N186 End stage renal disease: Secondary | ICD-10-CM | POA: Diagnosis not present

## 2016-07-15 DIAGNOSIS — E1129 Type 2 diabetes mellitus with other diabetic kidney complication: Secondary | ICD-10-CM | POA: Diagnosis not present

## 2016-07-15 DIAGNOSIS — E875 Hyperkalemia: Secondary | ICD-10-CM | POA: Diagnosis not present

## 2016-07-15 DIAGNOSIS — N2581 Secondary hyperparathyroidism of renal origin: Secondary | ICD-10-CM | POA: Diagnosis not present

## 2016-07-17 ENCOUNTER — Encounter (HOSPITAL_COMMUNITY): Payer: Self-pay | Admitting: *Deleted

## 2016-07-17 ENCOUNTER — Emergency Department (HOSPITAL_COMMUNITY)
Admission: EM | Admit: 2016-07-17 | Discharge: 2016-07-17 | Disposition: A | Discharge status: Home / Self Care | Payer: Medicare Other | Attending: Emergency Medicine | Admitting: Emergency Medicine

## 2016-07-17 DIAGNOSIS — I5033 Acute on chronic diastolic (congestive) heart failure: Secondary | ICD-10-CM | POA: Diagnosis not present

## 2016-07-17 DIAGNOSIS — Z79899 Other long term (current) drug therapy: Secondary | ICD-10-CM | POA: Insufficient documentation

## 2016-07-17 DIAGNOSIS — Z87891 Personal history of nicotine dependence: Secondary | ICD-10-CM | POA: Diagnosis not present

## 2016-07-17 DIAGNOSIS — Z992 Dependence on renal dialysis: Secondary | ICD-10-CM | POA: Diagnosis not present

## 2016-07-17 DIAGNOSIS — G43909 Migraine, unspecified, not intractable, without status migrainosus: Secondary | ICD-10-CM

## 2016-07-17 DIAGNOSIS — N186 End stage renal disease: Secondary | ICD-10-CM | POA: Diagnosis not present

## 2016-07-17 DIAGNOSIS — G43009 Migraine without aura, not intractable, without status migrainosus: Secondary | ICD-10-CM | POA: Diagnosis not present

## 2016-07-17 DIAGNOSIS — D631 Anemia in chronic kidney disease: Secondary | ICD-10-CM | POA: Diagnosis not present

## 2016-07-17 DIAGNOSIS — E1140 Type 2 diabetes mellitus with diabetic neuropathy, unspecified: Secondary | ICD-10-CM | POA: Insufficient documentation

## 2016-07-17 DIAGNOSIS — E1122 Type 2 diabetes mellitus with diabetic chronic kidney disease: Secondary | ICD-10-CM | POA: Diagnosis not present

## 2016-07-17 DIAGNOSIS — G43919 Migraine, unspecified, intractable, without status migrainosus: Secondary | ICD-10-CM | POA: Insufficient documentation

## 2016-07-17 DIAGNOSIS — I132 Hypertensive heart and chronic kidney disease with heart failure and with stage 5 chronic kidney disease, or end stage renal disease: Secondary | ICD-10-CM | POA: Insufficient documentation

## 2016-07-17 DIAGNOSIS — E875 Hyperkalemia: Secondary | ICD-10-CM | POA: Diagnosis not present

## 2016-07-17 DIAGNOSIS — N2581 Secondary hyperparathyroidism of renal origin: Secondary | ICD-10-CM | POA: Diagnosis not present

## 2016-07-17 DIAGNOSIS — R51 Headache: Secondary | ICD-10-CM | POA: Diagnosis present

## 2016-07-17 DIAGNOSIS — E1129 Type 2 diabetes mellitus with other diabetic kidney complication: Secondary | ICD-10-CM | POA: Diagnosis not present

## 2016-07-17 DIAGNOSIS — I2510 Atherosclerotic heart disease of native coronary artery without angina pectoris: Secondary | ICD-10-CM | POA: Diagnosis not present

## 2016-07-17 MED ORDER — ACETAMINOPHEN 500 MG PO TABS
1000.0000 mg | ORAL_TABLET | Freq: Once | ORAL | Status: AC
  Administered 2016-07-17: 1000 mg via ORAL
  Filled 2016-07-17: qty 2

## 2016-07-17 MED ORDER — ONDANSETRON 4 MG PO TBDP
4.0000 mg | ORAL_TABLET | Freq: Once | ORAL | Status: AC | PRN
  Administered 2016-07-17: 4 mg via ORAL

## 2016-07-17 MED ORDER — PROCHLORPERAZINE EDISYLATE 5 MG/ML IJ SOLN
10.0000 mg | Freq: Once | INTRAMUSCULAR | Status: AC
  Administered 2016-07-17: 10 mg via INTRAVENOUS
  Filled 2016-07-17: qty 2

## 2016-07-17 MED ORDER — KETOROLAC TROMETHAMINE 30 MG/ML IJ SOLN
30.0000 mg | Freq: Once | INTRAMUSCULAR | Status: DC
  Filled 2016-07-17: qty 1

## 2016-07-17 MED ORDER — ONDANSETRON 4 MG PO TBDP
ORAL_TABLET | ORAL | Status: AC
  Filled 2016-07-17: qty 1

## 2016-07-17 MED ORDER — DIPHENHYDRAMINE HCL 50 MG/ML IJ SOLN
25.0000 mg | Freq: Once | INTRAMUSCULAR | Status: AC
  Administered 2016-07-17: 25 mg via INTRAVENOUS
  Filled 2016-07-17: qty 1

## 2016-07-17 NOTE — ED Notes (Signed)
PA Lawyer made aware of patient's elevated bp, PA lawyer advised this RN that since pt did not receive all of her dialysis and has not had all of her meds today, pt is okay to be discharged but should take all remaining medications that she is due once she gets home. Pt advised of this plan and gave verbal acknowledgement

## 2016-07-17 NOTE — Discharge Instructions (Signed)
Follow-up with your primary care doctor.  Return here as needed °

## 2016-07-17 NOTE — ED Notes (Signed)
pts O2 sat in high 80's while sleeping, pt placed on 2L [place], O2 improved to 95%

## 2016-07-17 NOTE — ED Provider Notes (Signed)
[facility] DEPT Provider Note   CSN: [id number] Arrival date & time: 07/17/16  1001     History   Chief Complaint Chief Complaint  Patient presents with  . Migraine    HPI [name] is a 41 y.o. female.  HPI Patient presents to the emergency department with migraine headache that started during dialysis.  The patient states this is a chronic problem for her.  She states that she has light sensitivity and frontal headache.  She also has nausea and vomiting.  She states she vomited 4 times.  She states that they were not able to finish her dialysis and had to stop 45 minutes prior to the complete patient states that she normally takes Motrin prior to her treatments to prevent migraine headache. The patient denies chest pain, shortness of breath, ,blurred vision, neck pain, fever, cough, weakness, numbness, dizziness, anorexia, edema, abdominal pain, nausea, vomiting, diarrhea, rash, back pain, dysuria, hematemesis, bloody stool, near syncope, or syncope. Past Medical History:  Diagnosis Date  . Chronic anemia    2nd to renal disease  . CIN III (cervical intraepithelial neoplasia grade III) with severe dysplasia    S/P LEEP AND CONE  . Coronary artery disease    Status post cardiac catheterization June 2012 scattered coronary artery disease/atherosclerosis with 70-80% stenosis in a small right PDA.  . 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 DM (diabetes mellitus) ([place])    Long-term insulin  . End stage renal disease on dialysis ([place]) 05/02/11   [facility]; M; W, F; last time 05/01/11  . Fracture of 5th metatarsal 2016   Right  . Gastroparesis   . 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 History of abscesses in groins 12/06/2010  . Hyperlipidemia    Hypertriglyceridemia 449 HDL 25  . Hypertension   . Migraines    "just on dialysis days"  . Peripheral neuropathy (HCC)    related to DM  . Peripheral vascular disease ([place])    Tibial occlusive disease evaluated by Dr. Kellie Simmering in August  2011. Medical therapy  . Renal insufficiency    Dialysis since 2012  . Tobacco use disorder    Discontinued March 2012  . Trimalleolar fracture of ankle, closed 02/09/2015   Right    Patient Active Problem List   Diagnosis Date Noted  . Acute on chronic diastolic CHF (congestive heart failure) ([place]) 02/22/2016  . Fluid overload   . Pericardial effusion   . Ascites 02/15/2016  . DM (diabetes mellitus), type 2 with renal complications ([place]) [id number]  . Pain   . Bimalleolar ankle fracture 02/12/2015  . CAD (coronary artery disease) 02/09/2015  . Peripheral vascular disease ([place])   . Gastroparesis   . Chronic anemia   . Menorrhagia with regular cycle 01/04/2014  . Dysmenorrhea 01/04/2014  . Nausea & vomiting 10/16/2011  . History of noncompliance with medical treatment 05/09/2011  . Chronic UTI 05/09/2011  . CIN III (cervical intraepithelial neoplasia grade III) with severe dysplasia   . History of abscesses in groins 12/06/2010  . End stage renal disease on dialysis ([place])   . Coronary artery disease   . Hyperlipidemia   . 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 NONDEPENDENT TOBACCO USE DISORDER 12/20/2009  . Essential hypertension, benign 12/20/2009  . NEPHROTIC SYNDROME 12/20/2009  . Shortness of breath 12/20/2009  . Chest pain 12/20/2009    Past Surgical History:  Procedure Laterality Date  . AV FISTULA PLACEMENT  04/2010  . CARDIAC CATHETERIZATION [name] 02/10/2015  Procedure: Left Heart Cath and Coronary Angiography;  Surgeon: Dixie Dials, MD;  Location: [facility];  Service: Cardiovascular;  Laterality: [name];  . CARDIAC CATHETERIZATION [name] 02/21/2016   Procedure: Right Heart Cath;  Surgeon: Jolaine Artist, MD;  Location: [facility];  Service: Cardiovascular;  Laterality: [name];  . CERVICAL BIOPSY  W/ LOOP ELECTRODE EXCISION     h/o  . CERVICAL CONE BIOPSY     h/o  . COLPOSCOPY    . DILATION AND CURETTAGE OF UTERUS  2009  . ORIF ANKLE FRACTURE Right  02/09/2015  . ORIF ANKLE FRACTURE Right 02/16/2015   Procedure: OPEN REDUCTION INTERNAL FIXATION (ORIF) RIGHT ANKLE FRACTURE;  Surgeon: Altamese Garnet, MD;  Location: [facility];  Service: Orthopedics;  Laterality: Right;    OB History    Gravida Para Term Preterm AB Living   2 1 1   1 1    SAB TAB Ectopic Multiple Live Births                   Home Medications    Prior to Admission medications   Medication Sig Start Date End Date Taking? Authorizing Provider  amLODipine (NORVASC) 10 MG tablet Take 1 tablet (10 mg total) by mouth daily. 02/24/16  Yes Orson Eva, MD  amoxicillin-clavulanate (AUGMENTIN) 500-125 MG tablet Take 1 tablet (500 mg total) by mouth daily. X 7 days, then on Mon-Wed-Fri only after dialysis 02/24/16  Yes Orson Eva, MD  atorvastatin (LIPITOR) 40 MG tablet Take 40 mg by mouth daily.   Yes Historical Provider, MD  cloNIDine (CATAPRES) 0.3 MG tablet Take 1 tablet (0.3 mg total) by mouth 3 (three) times daily. 11/01/14  Yes Debbe Odea, MD  clopidogrel (PLAVIX) 75 MG tablet Take 75 mg by mouth at bedtime.    Yes Historical Provider, MD  Ferric Citrate (AURYXIA PO) Take 2 tablets by mouth 3 (three) times daily with meals.   Yes Historical Provider, MD  HUMALOG MIX 50/50 KWIKPEN (50-50) 100 UNIT/ML Kwikpen Inject 26-28 Units into the skin 2 (two) times daily. 28 in the morning and 26 in the evening. 04/29/14  Yes Historical Provider, MD  hydrALAZINE (APRESOLINE) 100 MG tablet Take 1 tablet (100 mg total) by mouth every 8 (eight) hours. 02/24/16  Yes Orson Eva, MD  isosorbide mononitrate (IMDUR) 120 MG 24 hr tablet Take 1 tablet (120 mg total) by mouth daily. 02/25/16  Yes Orson Eva, MD  labetalol (NORMODYNE) 300 MG tablet Take 1 tablet (300 mg total) by mouth 3 (three) times daily. 11/01/14  Yes Debbe Odea, MD  RENVELA 800 MG tablet Take 4,800 mg by mouth 3 (three) times daily with meals.  04/17/13  Yes Historical Provider, MD  traMADol (ULTRAM) 50 MG tablet Take 1 tablet (50 mg total) by  mouth every 6 (six) hours as needed for moderate pain (or Headache unrelieved by tylenol). 02/24/16  Yes Orson Eva, MD    Family History Family History  Problem Relation Age of Onset  . Diabetes Mother   . Hypertension Mother   . Diabetes Father   . Hyperlipidemia Father   . Hypertension Father   . Diabetes Sister   . Stroke Maternal Grandmother   . Diabetes Sister   . Breast cancer Maternal Aunt     Age 33's    Social History Social History  Substance Use Topics  . Smoking status: Former Smoker    Packs/day: 0.30    Years: 10.00    Types: Cigarettes  Quit date: 08/05/2011  . Smokeless tobacco: Never Used     Comment: "quit smoking cigarettes 07/2010"  . Alcohol use No     Allergies   Cephalexin and Sulfamethoxazole-trimethoprim   Review of Systems Review of Systems  All other systems negative except as documented in the HPI. All pertinent positives and negatives as reviewed in the HPI. Physical Exam Updated Vital Signs BP (!) 211/81   Pulse 67   Temp 97.8 F (36.6 C) (Oral)   Resp 16   Ht 6\' 4"  (1.93 m)   Wt 99.8 kg   LMP 10/09/2015   SpO2 93%   BMI 26.78 kg/m   Physical Exam  Constitutional: She is oriented to person, place, and time. She appears well-developed and well-nourished. No distress.  HENT:  Head: Normocephalic and atraumatic.  Mouth/Throat: Oropharynx is clear and moist.  Eyes: Pupils are equal, round, and reactive to light.  Neck: Normal range of motion. Neck supple.  Cardiovascular: Normal rate, regular rhythm and normal heart sounds.  Exam reveals no gallop and no friction rub.   No murmur heard. Pulmonary/Chest: Effort normal and breath sounds normal. No respiratory distress. She has no wheezes.  Abdominal: Soft. Bowel sounds are normal. She exhibits no distension. There is no tenderness.  Neurological: She is alert and oriented to person, place, and time. No sensory deficit. She exhibits normal muscle tone. Coordination normal.  Skin:  Skin is warm and dry. No rash noted. No erythema.  Psychiatric: She has a normal mood and affect. Her behavior is normal.  Nursing note and vitals reviewed.    ED Treatments / Results  Labs (all labs ordered are listed, but only abnormal results are displayed) Labs Reviewed - No data to display  EKG  EKG Interpretation None       Radiology No results found.  Procedures Procedures (including critical care time)  Medications Ordered in ED Medications  ondansetron (ZOFRAN-ODT) 4 MG disintegrating tablet (not administered)  ketorolac (TORADOL) 30 MG/ML injection 30 mg (30 mg Intravenous Not Given 07/17/16 1341)  ondansetron (ZOFRAN-ODT) disintegrating tablet 4 mg (4 mg Oral Given 07/17/16 1033)  diphenhydrAMINE (BENADRYL) injection 25 mg (25 mg Intravenous Given 07/17/16 1303)  prochlorperazine (COMPAZINE) injection 10 mg (10 mg Intravenous Given 07/17/16 1304)  acetaminophen (TYLENOL) tablet 1,000 mg (1,000 mg Oral Given 07/17/16 1543)     Initial Impression / Assessment and Plan / ED Course  I have reviewed the triage vital signs and the nursing notes.  Pertinent labs & imaging results that were available during my care of the patient were reviewed by me and considered in my medical decision making (see chart for details).     She denies any neurological deficit.  She is given treatment for her migraine headache and states that she feels better at this time.  Patient be discharged home.  The nurse did make me aware the patient's blood pressure.  The patient was unable to finish her dialysis along with the headache and the fact that she did not take her medicines today is most likely contributing to this factor.  I have advised her to follow-up with her doctor, told to return here as needed.  Patient agrees the plan and all questions were answered  Final Clinical Impressions(s) / ED Diagnoses   Final diagnoses:  Migraine without status migrainosus, not intractable, unspecified  migraine type    New Prescriptions Discharge Medication List as of 07/17/2016  3:19 PM       Dalia Heading, PA-C  07/17/16 [place], MD 07/18/16 [phone]

## 2016-07-17 NOTE — ED Triage Notes (Signed)
Pt c/o migraine, sensitivity to light, n/v with x 4 vomiting episodes, pt reports hx of migraines on HD days, pt received HD today, pt reports being taken off 45 mins earlier, pt A&O x4

## 2016-07-19 DIAGNOSIS — D631 Anemia in chronic kidney disease: Secondary | ICD-10-CM | POA: Diagnosis not present

## 2016-07-19 DIAGNOSIS — E875 Hyperkalemia: Secondary | ICD-10-CM | POA: Diagnosis not present

## 2016-07-19 DIAGNOSIS — N2581 Secondary hyperparathyroidism of renal origin: Secondary | ICD-10-CM | POA: Diagnosis not present

## 2016-07-19 DIAGNOSIS — E1129 Type 2 diabetes mellitus with other diabetic kidney complication: Secondary | ICD-10-CM | POA: Diagnosis not present

## 2016-07-19 DIAGNOSIS — N186 End stage renal disease: Secondary | ICD-10-CM | POA: Diagnosis not present

## 2016-07-22 DIAGNOSIS — N2581 Secondary hyperparathyroidism of renal origin: Secondary | ICD-10-CM | POA: Diagnosis not present

## 2016-07-22 DIAGNOSIS — N186 End stage renal disease: Secondary | ICD-10-CM | POA: Diagnosis not present

## 2016-07-22 DIAGNOSIS — E875 Hyperkalemia: Secondary | ICD-10-CM | POA: Diagnosis not present

## 2016-07-22 DIAGNOSIS — D631 Anemia in chronic kidney disease: Secondary | ICD-10-CM | POA: Diagnosis not present

## 2016-07-22 DIAGNOSIS — E1129 Type 2 diabetes mellitus with other diabetic kidney complication: Secondary | ICD-10-CM | POA: Diagnosis not present

## 2016-07-24 DIAGNOSIS — Z992 Dependence on renal dialysis: Secondary | ICD-10-CM | POA: Diagnosis not present

## 2016-07-24 DIAGNOSIS — D631 Anemia in chronic kidney disease: Secondary | ICD-10-CM | POA: Diagnosis not present

## 2016-07-24 DIAGNOSIS — N2581 Secondary hyperparathyroidism of renal origin: Secondary | ICD-10-CM | POA: Diagnosis not present

## 2016-07-24 DIAGNOSIS — N186 End stage renal disease: Secondary | ICD-10-CM | POA: Diagnosis not present

## 2016-07-24 DIAGNOSIS — E1129 Type 2 diabetes mellitus with other diabetic kidney complication: Secondary | ICD-10-CM | POA: Diagnosis not present

## 2016-07-24 DIAGNOSIS — E875 Hyperkalemia: Secondary | ICD-10-CM | POA: Diagnosis not present

## 2016-07-26 DIAGNOSIS — E1129 Type 2 diabetes mellitus with other diabetic kidney complication: Secondary | ICD-10-CM | POA: Diagnosis not present

## 2016-07-26 DIAGNOSIS — E875 Hyperkalemia: Secondary | ICD-10-CM | POA: Diagnosis not present

## 2016-07-26 DIAGNOSIS — N2581 Secondary hyperparathyroidism of renal origin: Secondary | ICD-10-CM | POA: Diagnosis not present

## 2016-07-26 DIAGNOSIS — N186 End stage renal disease: Secondary | ICD-10-CM | POA: Diagnosis not present

## 2016-07-26 DIAGNOSIS — D631 Anemia in chronic kidney disease: Secondary | ICD-10-CM | POA: Diagnosis not present

## 2016-07-26 DIAGNOSIS — D509 Iron deficiency anemia, unspecified: Secondary | ICD-10-CM | POA: Diagnosis not present

## 2016-07-29 DIAGNOSIS — N2581 Secondary hyperparathyroidism of renal origin: Secondary | ICD-10-CM | POA: Diagnosis not present

## 2016-07-29 DIAGNOSIS — D509 Iron deficiency anemia, unspecified: Secondary | ICD-10-CM | POA: Diagnosis not present

## 2016-07-29 DIAGNOSIS — E1129 Type 2 diabetes mellitus with other diabetic kidney complication: Secondary | ICD-10-CM | POA: Diagnosis not present

## 2016-07-29 DIAGNOSIS — D631 Anemia in chronic kidney disease: Secondary | ICD-10-CM | POA: Diagnosis not present

## 2016-07-29 DIAGNOSIS — N186 End stage renal disease: Secondary | ICD-10-CM | POA: Diagnosis not present

## 2016-07-31 DIAGNOSIS — D631 Anemia in chronic kidney disease: Secondary | ICD-10-CM | POA: Diagnosis not present

## 2016-07-31 DIAGNOSIS — D509 Iron deficiency anemia, unspecified: Secondary | ICD-10-CM | POA: Diagnosis not present

## 2016-07-31 DIAGNOSIS — N2581 Secondary hyperparathyroidism of renal origin: Secondary | ICD-10-CM | POA: Diagnosis not present

## 2016-07-31 DIAGNOSIS — N186 End stage renal disease: Secondary | ICD-10-CM | POA: Diagnosis not present

## 2016-07-31 DIAGNOSIS — E1129 Type 2 diabetes mellitus with other diabetic kidney complication: Secondary | ICD-10-CM | POA: Diagnosis not present

## 2016-08-02 DIAGNOSIS — D509 Iron deficiency anemia, unspecified: Secondary | ICD-10-CM | POA: Diagnosis not present

## 2016-08-02 DIAGNOSIS — D631 Anemia in chronic kidney disease: Secondary | ICD-10-CM | POA: Diagnosis not present

## 2016-08-02 DIAGNOSIS — N186 End stage renal disease: Secondary | ICD-10-CM | POA: Diagnosis not present

## 2016-08-02 DIAGNOSIS — N2581 Secondary hyperparathyroidism of renal origin: Secondary | ICD-10-CM | POA: Diagnosis not present

## 2016-08-02 DIAGNOSIS — E1129 Type 2 diabetes mellitus with other diabetic kidney complication: Secondary | ICD-10-CM | POA: Diagnosis not present

## 2016-08-05 DIAGNOSIS — D509 Iron deficiency anemia, unspecified: Secondary | ICD-10-CM | POA: Diagnosis not present

## 2016-08-05 DIAGNOSIS — N186 End stage renal disease: Secondary | ICD-10-CM | POA: Diagnosis not present

## 2016-08-05 DIAGNOSIS — E1129 Type 2 diabetes mellitus with other diabetic kidney complication: Secondary | ICD-10-CM | POA: Diagnosis not present

## 2016-08-05 DIAGNOSIS — D631 Anemia in chronic kidney disease: Secondary | ICD-10-CM | POA: Diagnosis not present

## 2016-08-05 DIAGNOSIS — N2581 Secondary hyperparathyroidism of renal origin: Secondary | ICD-10-CM | POA: Diagnosis not present

## 2016-08-07 DIAGNOSIS — D631 Anemia in chronic kidney disease: Secondary | ICD-10-CM | POA: Diagnosis not present

## 2016-08-07 DIAGNOSIS — N186 End stage renal disease: Secondary | ICD-10-CM | POA: Diagnosis not present

## 2016-08-07 DIAGNOSIS — N2581 Secondary hyperparathyroidism of renal origin: Secondary | ICD-10-CM | POA: Diagnosis not present

## 2016-08-07 DIAGNOSIS — E1129 Type 2 diabetes mellitus with other diabetic kidney complication: Secondary | ICD-10-CM | POA: Diagnosis not present

## 2016-08-07 DIAGNOSIS — D509 Iron deficiency anemia, unspecified: Secondary | ICD-10-CM | POA: Diagnosis not present

## 2016-08-09 DIAGNOSIS — E1129 Type 2 diabetes mellitus with other diabetic kidney complication: Secondary | ICD-10-CM | POA: Diagnosis not present

## 2016-08-09 DIAGNOSIS — D631 Anemia in chronic kidney disease: Secondary | ICD-10-CM | POA: Diagnosis not present

## 2016-08-09 DIAGNOSIS — N2581 Secondary hyperparathyroidism of renal origin: Secondary | ICD-10-CM | POA: Diagnosis not present

## 2016-08-09 DIAGNOSIS — N186 End stage renal disease: Secondary | ICD-10-CM | POA: Diagnosis not present

## 2016-08-09 DIAGNOSIS — D509 Iron deficiency anemia, unspecified: Secondary | ICD-10-CM | POA: Diagnosis not present

## 2016-08-12 DIAGNOSIS — D631 Anemia in chronic kidney disease: Secondary | ICD-10-CM | POA: Diagnosis not present

## 2016-08-12 DIAGNOSIS — N186 End stage renal disease: Secondary | ICD-10-CM | POA: Diagnosis not present

## 2016-08-12 DIAGNOSIS — E1129 Type 2 diabetes mellitus with other diabetic kidney complication: Secondary | ICD-10-CM | POA: Diagnosis not present

## 2016-08-12 DIAGNOSIS — D509 Iron deficiency anemia, unspecified: Secondary | ICD-10-CM | POA: Diagnosis not present

## 2016-08-12 DIAGNOSIS — N2581 Secondary hyperparathyroidism of renal origin: Secondary | ICD-10-CM | POA: Diagnosis not present

## 2016-08-13 DIAGNOSIS — Z7902 Long term (current) use of antithrombotics/antiplatelets: Secondary | ICD-10-CM | POA: Diagnosis not present

## 2016-08-13 DIAGNOSIS — E1022 Type 1 diabetes mellitus with diabetic chronic kidney disease: Secondary | ICD-10-CM | POA: Diagnosis not present

## 2016-08-13 DIAGNOSIS — I5083 High output heart failure: Secondary | ICD-10-CM | POA: Diagnosis not present

## 2016-08-13 DIAGNOSIS — I272 Pulmonary hypertension, unspecified: Secondary | ICD-10-CM | POA: Diagnosis not present

## 2016-08-13 DIAGNOSIS — R0609 Other forms of dyspnea: Secondary | ICD-10-CM | POA: Diagnosis not present

## 2016-08-13 DIAGNOSIS — I2510 Atherosclerotic heart disease of native coronary artery without angina pectoris: Secondary | ICD-10-CM | POA: Diagnosis not present

## 2016-08-13 DIAGNOSIS — Z8249 Family history of ischemic heart disease and other diseases of the circulatory system: Secondary | ICD-10-CM | POA: Diagnosis not present

## 2016-08-13 DIAGNOSIS — I132 Hypertensive heart and chronic kidney disease with heart failure and with stage 5 chronic kidney disease, or end stage renal disease: Secondary | ICD-10-CM | POA: Diagnosis not present

## 2016-08-13 DIAGNOSIS — Z992 Dependence on renal dialysis: Secondary | ICD-10-CM | POA: Diagnosis not present

## 2016-08-13 DIAGNOSIS — Z87891 Personal history of nicotine dependence: Secondary | ICD-10-CM | POA: Diagnosis not present

## 2016-08-13 DIAGNOSIS — Z794 Long term (current) use of insulin: Secondary | ICD-10-CM | POA: Diagnosis not present

## 2016-08-13 DIAGNOSIS — Z833 Family history of diabetes mellitus: Secondary | ICD-10-CM | POA: Diagnosis not present

## 2016-08-13 DIAGNOSIS — N186 End stage renal disease: Secondary | ICD-10-CM | POA: Diagnosis not present

## 2016-08-13 DIAGNOSIS — Z6825 Body mass index (BMI) 25.0-25.9, adult: Secondary | ICD-10-CM | POA: Diagnosis not present

## 2016-08-13 DIAGNOSIS — Z79899 Other long term (current) drug therapy: Secondary | ICD-10-CM | POA: Diagnosis not present

## 2016-08-13 DIAGNOSIS — E1021 Type 1 diabetes mellitus with diabetic nephropathy: Secondary | ICD-10-CM | POA: Diagnosis not present

## 2016-08-13 DIAGNOSIS — Z888 Allergy status to other drugs, medicaments and biological substances status: Secondary | ICD-10-CM | POA: Diagnosis not present

## 2016-08-13 DIAGNOSIS — Z882 Allergy status to sulfonamides status: Secondary | ICD-10-CM | POA: Diagnosis not present

## 2016-08-13 DIAGNOSIS — Z841 Family history of disorders of kidney and ureter: Secondary | ICD-10-CM | POA: Diagnosis not present

## 2016-08-14 DIAGNOSIS — E1129 Type 2 diabetes mellitus with other diabetic kidney complication: Secondary | ICD-10-CM | POA: Diagnosis not present

## 2016-08-14 DIAGNOSIS — D509 Iron deficiency anemia, unspecified: Secondary | ICD-10-CM | POA: Diagnosis not present

## 2016-08-14 DIAGNOSIS — D631 Anemia in chronic kidney disease: Secondary | ICD-10-CM | POA: Diagnosis not present

## 2016-08-14 DIAGNOSIS — N186 End stage renal disease: Secondary | ICD-10-CM | POA: Diagnosis not present

## 2016-08-14 DIAGNOSIS — N2581 Secondary hyperparathyroidism of renal origin: Secondary | ICD-10-CM | POA: Diagnosis not present

## 2016-08-15 DIAGNOSIS — H4921 Sixth [abducent] nerve palsy, right eye: Secondary | ICD-10-CM | POA: Diagnosis not present

## 2016-08-15 DIAGNOSIS — H4423 Degenerative myopia, bilateral: Secondary | ICD-10-CM | POA: Diagnosis not present

## 2016-08-16 DIAGNOSIS — D631 Anemia in chronic kidney disease: Secondary | ICD-10-CM | POA: Diagnosis not present

## 2016-08-16 DIAGNOSIS — E1129 Type 2 diabetes mellitus with other diabetic kidney complication: Secondary | ICD-10-CM | POA: Diagnosis not present

## 2016-08-16 DIAGNOSIS — N2581 Secondary hyperparathyroidism of renal origin: Secondary | ICD-10-CM | POA: Diagnosis not present

## 2016-08-16 DIAGNOSIS — N186 End stage renal disease: Secondary | ICD-10-CM | POA: Diagnosis not present

## 2016-08-16 DIAGNOSIS — D509 Iron deficiency anemia, unspecified: Secondary | ICD-10-CM | POA: Diagnosis not present

## 2016-08-19 DIAGNOSIS — N186 End stage renal disease: Secondary | ICD-10-CM | POA: Diagnosis not present

## 2016-08-19 DIAGNOSIS — D509 Iron deficiency anemia, unspecified: Secondary | ICD-10-CM | POA: Diagnosis not present

## 2016-08-19 DIAGNOSIS — N2581 Secondary hyperparathyroidism of renal origin: Secondary | ICD-10-CM | POA: Diagnosis not present

## 2016-08-19 DIAGNOSIS — E1129 Type 2 diabetes mellitus with other diabetic kidney complication: Secondary | ICD-10-CM | POA: Diagnosis not present

## 2016-08-19 DIAGNOSIS — D631 Anemia in chronic kidney disease: Secondary | ICD-10-CM | POA: Diagnosis not present

## 2016-08-21 DIAGNOSIS — E1129 Type 2 diabetes mellitus with other diabetic kidney complication: Secondary | ICD-10-CM | POA: Diagnosis not present

## 2016-08-21 DIAGNOSIS — D631 Anemia in chronic kidney disease: Secondary | ICD-10-CM | POA: Diagnosis not present

## 2016-08-21 DIAGNOSIS — D509 Iron deficiency anemia, unspecified: Secondary | ICD-10-CM | POA: Diagnosis not present

## 2016-08-21 DIAGNOSIS — N2581 Secondary hyperparathyroidism of renal origin: Secondary | ICD-10-CM | POA: Diagnosis not present

## 2016-08-21 DIAGNOSIS — N186 End stage renal disease: Secondary | ICD-10-CM | POA: Diagnosis not present

## 2016-08-23 DIAGNOSIS — N186 End stage renal disease: Secondary | ICD-10-CM | POA: Diagnosis not present

## 2016-08-23 DIAGNOSIS — E1129 Type 2 diabetes mellitus with other diabetic kidney complication: Secondary | ICD-10-CM | POA: Diagnosis not present

## 2016-08-23 DIAGNOSIS — D509 Iron deficiency anemia, unspecified: Secondary | ICD-10-CM | POA: Diagnosis not present

## 2016-08-23 DIAGNOSIS — N2581 Secondary hyperparathyroidism of renal origin: Secondary | ICD-10-CM | POA: Diagnosis not present

## 2016-08-23 DIAGNOSIS — D631 Anemia in chronic kidney disease: Secondary | ICD-10-CM | POA: Diagnosis not present

## 2016-08-24 DIAGNOSIS — E1129 Type 2 diabetes mellitus with other diabetic kidney complication: Secondary | ICD-10-CM | POA: Diagnosis not present

## 2016-08-24 DIAGNOSIS — N186 End stage renal disease: Secondary | ICD-10-CM | POA: Diagnosis not present

## 2016-08-24 DIAGNOSIS — Z992 Dependence on renal dialysis: Secondary | ICD-10-CM | POA: Diagnosis not present

## 2016-08-26 DIAGNOSIS — E1129 Type 2 diabetes mellitus with other diabetic kidney complication: Secondary | ICD-10-CM | POA: Diagnosis not present

## 2016-08-26 DIAGNOSIS — E875 Hyperkalemia: Secondary | ICD-10-CM | POA: Diagnosis not present

## 2016-08-26 DIAGNOSIS — N186 End stage renal disease: Secondary | ICD-10-CM | POA: Diagnosis not present

## 2016-08-26 DIAGNOSIS — N2581 Secondary hyperparathyroidism of renal origin: Secondary | ICD-10-CM | POA: Diagnosis not present

## 2016-08-26 DIAGNOSIS — D631 Anemia in chronic kidney disease: Secondary | ICD-10-CM | POA: Diagnosis not present

## 2016-08-28 DIAGNOSIS — N2581 Secondary hyperparathyroidism of renal origin: Secondary | ICD-10-CM | POA: Diagnosis not present

## 2016-08-28 DIAGNOSIS — N186 End stage renal disease: Secondary | ICD-10-CM | POA: Diagnosis not present

## 2016-08-28 DIAGNOSIS — E875 Hyperkalemia: Secondary | ICD-10-CM | POA: Diagnosis not present

## 2016-08-28 DIAGNOSIS — E1129 Type 2 diabetes mellitus with other diabetic kidney complication: Secondary | ICD-10-CM | POA: Diagnosis not present

## 2016-08-28 DIAGNOSIS — D631 Anemia in chronic kidney disease: Secondary | ICD-10-CM | POA: Diagnosis not present

## 2016-08-29 DIAGNOSIS — L0291 Cutaneous abscess, unspecified: Secondary | ICD-10-CM | POA: Diagnosis not present

## 2016-08-30 DIAGNOSIS — N2581 Secondary hyperparathyroidism of renal origin: Secondary | ICD-10-CM | POA: Diagnosis not present

## 2016-08-30 DIAGNOSIS — E1129 Type 2 diabetes mellitus with other diabetic kidney complication: Secondary | ICD-10-CM | POA: Diagnosis not present

## 2016-08-30 DIAGNOSIS — E875 Hyperkalemia: Secondary | ICD-10-CM | POA: Diagnosis not present

## 2016-08-30 DIAGNOSIS — N186 End stage renal disease: Secondary | ICD-10-CM | POA: Diagnosis not present

## 2016-08-30 DIAGNOSIS — D631 Anemia in chronic kidney disease: Secondary | ICD-10-CM | POA: Diagnosis not present

## 2016-09-02 DIAGNOSIS — D631 Anemia in chronic kidney disease: Secondary | ICD-10-CM | POA: Diagnosis not present

## 2016-09-02 DIAGNOSIS — E875 Hyperkalemia: Secondary | ICD-10-CM | POA: Diagnosis not present

## 2016-09-02 DIAGNOSIS — E1129 Type 2 diabetes mellitus with other diabetic kidney complication: Secondary | ICD-10-CM | POA: Diagnosis not present

## 2016-09-02 DIAGNOSIS — N186 End stage renal disease: Secondary | ICD-10-CM | POA: Diagnosis not present

## 2016-09-02 DIAGNOSIS — N2581 Secondary hyperparathyroidism of renal origin: Secondary | ICD-10-CM | POA: Diagnosis not present

## 2016-09-03 DIAGNOSIS — Z992 Dependence on renal dialysis: Secondary | ICD-10-CM | POA: Diagnosis not present

## 2016-09-03 DIAGNOSIS — I871 Compression of vein: Secondary | ICD-10-CM | POA: Diagnosis not present

## 2016-09-03 DIAGNOSIS — N186 End stage renal disease: Secondary | ICD-10-CM | POA: Diagnosis not present

## 2016-09-04 DIAGNOSIS — D631 Anemia in chronic kidney disease: Secondary | ICD-10-CM | POA: Diagnosis not present

## 2016-09-04 DIAGNOSIS — N2581 Secondary hyperparathyroidism of renal origin: Secondary | ICD-10-CM | POA: Diagnosis not present

## 2016-09-04 DIAGNOSIS — E875 Hyperkalemia: Secondary | ICD-10-CM | POA: Diagnosis not present

## 2016-09-04 DIAGNOSIS — E1129 Type 2 diabetes mellitus with other diabetic kidney complication: Secondary | ICD-10-CM | POA: Diagnosis not present

## 2016-09-04 DIAGNOSIS — N186 End stage renal disease: Secondary | ICD-10-CM | POA: Diagnosis not present

## 2016-09-06 DIAGNOSIS — N2581 Secondary hyperparathyroidism of renal origin: Secondary | ICD-10-CM | POA: Diagnosis not present

## 2016-09-06 DIAGNOSIS — E875 Hyperkalemia: Secondary | ICD-10-CM | POA: Diagnosis not present

## 2016-09-06 DIAGNOSIS — D631 Anemia in chronic kidney disease: Secondary | ICD-10-CM | POA: Diagnosis not present

## 2016-09-06 DIAGNOSIS — E1129 Type 2 diabetes mellitus with other diabetic kidney complication: Secondary | ICD-10-CM | POA: Diagnosis not present

## 2016-09-06 DIAGNOSIS — N186 End stage renal disease: Secondary | ICD-10-CM | POA: Diagnosis not present

## 2016-09-09 DIAGNOSIS — N186 End stage renal disease: Secondary | ICD-10-CM | POA: Diagnosis not present

## 2016-09-09 DIAGNOSIS — E1129 Type 2 diabetes mellitus with other diabetic kidney complication: Secondary | ICD-10-CM | POA: Diagnosis not present

## 2016-09-09 DIAGNOSIS — E875 Hyperkalemia: Secondary | ICD-10-CM | POA: Diagnosis not present

## 2016-09-09 DIAGNOSIS — N2581 Secondary hyperparathyroidism of renal origin: Secondary | ICD-10-CM | POA: Diagnosis not present

## 2016-09-09 DIAGNOSIS — D631 Anemia in chronic kidney disease: Secondary | ICD-10-CM | POA: Diagnosis not present

## 2016-09-10 DIAGNOSIS — N2581 Secondary hyperparathyroidism of renal origin: Secondary | ICD-10-CM | POA: Diagnosis not present

## 2016-09-10 DIAGNOSIS — N186 End stage renal disease: Secondary | ICD-10-CM | POA: Diagnosis not present

## 2016-09-10 DIAGNOSIS — E8770 Fluid overload, unspecified: Secondary | ICD-10-CM | POA: Diagnosis not present

## 2016-09-11 DIAGNOSIS — N186 End stage renal disease: Secondary | ICD-10-CM | POA: Diagnosis not present

## 2016-09-11 DIAGNOSIS — D631 Anemia in chronic kidney disease: Secondary | ICD-10-CM | POA: Diagnosis not present

## 2016-09-11 DIAGNOSIS — N2581 Secondary hyperparathyroidism of renal origin: Secondary | ICD-10-CM | POA: Diagnosis not present

## 2016-09-11 DIAGNOSIS — E1129 Type 2 diabetes mellitus with other diabetic kidney complication: Secondary | ICD-10-CM | POA: Diagnosis not present

## 2016-09-11 DIAGNOSIS — E875 Hyperkalemia: Secondary | ICD-10-CM | POA: Diagnosis not present

## 2016-09-13 DIAGNOSIS — D631 Anemia in chronic kidney disease: Secondary | ICD-10-CM | POA: Diagnosis not present

## 2016-09-13 DIAGNOSIS — E875 Hyperkalemia: Secondary | ICD-10-CM | POA: Diagnosis not present

## 2016-09-13 DIAGNOSIS — N186 End stage renal disease: Secondary | ICD-10-CM | POA: Diagnosis not present

## 2016-09-13 DIAGNOSIS — N2581 Secondary hyperparathyroidism of renal origin: Secondary | ICD-10-CM | POA: Diagnosis not present

## 2016-09-13 DIAGNOSIS — E1129 Type 2 diabetes mellitus with other diabetic kidney complication: Secondary | ICD-10-CM | POA: Diagnosis not present

## 2016-09-16 DIAGNOSIS — N2581 Secondary hyperparathyroidism of renal origin: Secondary | ICD-10-CM | POA: Diagnosis not present

## 2016-09-16 DIAGNOSIS — N186 End stage renal disease: Secondary | ICD-10-CM | POA: Diagnosis not present

## 2016-09-16 DIAGNOSIS — E1129 Type 2 diabetes mellitus with other diabetic kidney complication: Secondary | ICD-10-CM | POA: Diagnosis not present

## 2016-09-16 DIAGNOSIS — D631 Anemia in chronic kidney disease: Secondary | ICD-10-CM | POA: Diagnosis not present

## 2016-09-16 DIAGNOSIS — E875 Hyperkalemia: Secondary | ICD-10-CM | POA: Diagnosis not present

## 2016-09-18 DIAGNOSIS — D631 Anemia in chronic kidney disease: Secondary | ICD-10-CM | POA: Diagnosis not present

## 2016-09-18 DIAGNOSIS — N186 End stage renal disease: Secondary | ICD-10-CM | POA: Diagnosis not present

## 2016-09-18 DIAGNOSIS — E875 Hyperkalemia: Secondary | ICD-10-CM | POA: Diagnosis not present

## 2016-09-18 DIAGNOSIS — N2581 Secondary hyperparathyroidism of renal origin: Secondary | ICD-10-CM | POA: Diagnosis not present

## 2016-09-18 DIAGNOSIS — E1129 Type 2 diabetes mellitus with other diabetic kidney complication: Secondary | ICD-10-CM | POA: Diagnosis not present

## 2016-09-20 DIAGNOSIS — N186 End stage renal disease: Secondary | ICD-10-CM | POA: Diagnosis not present

## 2016-09-20 DIAGNOSIS — N2581 Secondary hyperparathyroidism of renal origin: Secondary | ICD-10-CM | POA: Diagnosis not present

## 2016-09-20 DIAGNOSIS — E875 Hyperkalemia: Secondary | ICD-10-CM | POA: Diagnosis not present

## 2016-09-20 DIAGNOSIS — E1129 Type 2 diabetes mellitus with other diabetic kidney complication: Secondary | ICD-10-CM | POA: Diagnosis not present

## 2016-09-20 DIAGNOSIS — D631 Anemia in chronic kidney disease: Secondary | ICD-10-CM | POA: Diagnosis not present

## 2016-09-23 DIAGNOSIS — D631 Anemia in chronic kidney disease: Secondary | ICD-10-CM | POA: Diagnosis not present

## 2016-09-23 DIAGNOSIS — N2581 Secondary hyperparathyroidism of renal origin: Secondary | ICD-10-CM | POA: Diagnosis not present

## 2016-09-23 DIAGNOSIS — Z01810 Encounter for preprocedural cardiovascular examination: Secondary | ICD-10-CM | POA: Diagnosis not present

## 2016-09-23 DIAGNOSIS — N186 End stage renal disease: Secondary | ICD-10-CM | POA: Diagnosis not present

## 2016-09-23 DIAGNOSIS — E1129 Type 2 diabetes mellitus with other diabetic kidney complication: Secondary | ICD-10-CM | POA: Diagnosis not present

## 2016-09-23 DIAGNOSIS — I2510 Atherosclerotic heart disease of native coronary artery without angina pectoris: Secondary | ICD-10-CM | POA: Diagnosis not present

## 2016-09-23 DIAGNOSIS — Z992 Dependence on renal dialysis: Secondary | ICD-10-CM | POA: Diagnosis not present

## 2016-09-23 DIAGNOSIS — E875 Hyperkalemia: Secondary | ICD-10-CM | POA: Diagnosis not present

## 2016-09-25 DIAGNOSIS — D631 Anemia in chronic kidney disease: Secondary | ICD-10-CM | POA: Diagnosis not present

## 2016-09-25 DIAGNOSIS — E1129 Type 2 diabetes mellitus with other diabetic kidney complication: Secondary | ICD-10-CM | POA: Diagnosis not present

## 2016-09-25 DIAGNOSIS — E875 Hyperkalemia: Secondary | ICD-10-CM | POA: Diagnosis not present

## 2016-09-25 DIAGNOSIS — N186 End stage renal disease: Secondary | ICD-10-CM | POA: Diagnosis not present

## 2016-09-25 DIAGNOSIS — N2581 Secondary hyperparathyroidism of renal origin: Secondary | ICD-10-CM | POA: Diagnosis not present

## 2016-09-27 DIAGNOSIS — N2581 Secondary hyperparathyroidism of renal origin: Secondary | ICD-10-CM | POA: Diagnosis not present

## 2016-09-27 DIAGNOSIS — E1129 Type 2 diabetes mellitus with other diabetic kidney complication: Secondary | ICD-10-CM | POA: Diagnosis not present

## 2016-09-27 DIAGNOSIS — D631 Anemia in chronic kidney disease: Secondary | ICD-10-CM | POA: Diagnosis not present

## 2016-09-27 DIAGNOSIS — N186 End stage renal disease: Secondary | ICD-10-CM | POA: Diagnosis not present

## 2016-09-27 DIAGNOSIS — E875 Hyperkalemia: Secondary | ICD-10-CM | POA: Diagnosis not present

## 2016-09-30 DIAGNOSIS — E875 Hyperkalemia: Secondary | ICD-10-CM | POA: Diagnosis not present

## 2016-09-30 DIAGNOSIS — D631 Anemia in chronic kidney disease: Secondary | ICD-10-CM | POA: Diagnosis not present

## 2016-09-30 DIAGNOSIS — N186 End stage renal disease: Secondary | ICD-10-CM | POA: Diagnosis not present

## 2016-09-30 DIAGNOSIS — N2581 Secondary hyperparathyroidism of renal origin: Secondary | ICD-10-CM | POA: Diagnosis not present

## 2016-09-30 DIAGNOSIS — E1129 Type 2 diabetes mellitus with other diabetic kidney complication: Secondary | ICD-10-CM | POA: Diagnosis not present

## 2016-10-02 DIAGNOSIS — N186 End stage renal disease: Secondary | ICD-10-CM | POA: Diagnosis not present

## 2016-10-02 DIAGNOSIS — E875 Hyperkalemia: Secondary | ICD-10-CM | POA: Diagnosis not present

## 2016-10-02 DIAGNOSIS — E1129 Type 2 diabetes mellitus with other diabetic kidney complication: Secondary | ICD-10-CM | POA: Diagnosis not present

## 2016-10-02 DIAGNOSIS — N2581 Secondary hyperparathyroidism of renal origin: Secondary | ICD-10-CM | POA: Diagnosis not present

## 2016-10-02 DIAGNOSIS — D631 Anemia in chronic kidney disease: Secondary | ICD-10-CM | POA: Diagnosis not present

## 2016-10-04 DIAGNOSIS — E1129 Type 2 diabetes mellitus with other diabetic kidney complication: Secondary | ICD-10-CM | POA: Diagnosis not present

## 2016-10-04 DIAGNOSIS — E875 Hyperkalemia: Secondary | ICD-10-CM | POA: Diagnosis not present

## 2016-10-04 DIAGNOSIS — D631 Anemia in chronic kidney disease: Secondary | ICD-10-CM | POA: Diagnosis not present

## 2016-10-04 DIAGNOSIS — N2581 Secondary hyperparathyroidism of renal origin: Secondary | ICD-10-CM | POA: Diagnosis not present

## 2016-10-04 DIAGNOSIS — N186 End stage renal disease: Secondary | ICD-10-CM | POA: Diagnosis not present

## 2016-10-07 DIAGNOSIS — E1129 Type 2 diabetes mellitus with other diabetic kidney complication: Secondary | ICD-10-CM | POA: Diagnosis not present

## 2016-10-07 DIAGNOSIS — N2581 Secondary hyperparathyroidism of renal origin: Secondary | ICD-10-CM | POA: Diagnosis not present

## 2016-10-07 DIAGNOSIS — N186 End stage renal disease: Secondary | ICD-10-CM | POA: Diagnosis not present

## 2016-10-07 DIAGNOSIS — D631 Anemia in chronic kidney disease: Secondary | ICD-10-CM | POA: Diagnosis not present

## 2016-10-07 DIAGNOSIS — E875 Hyperkalemia: Secondary | ICD-10-CM | POA: Diagnosis not present

## 2016-10-09 ENCOUNTER — Encounter: Payer: Self-pay | Admitting: Gynecology

## 2016-10-09 DIAGNOSIS — D631 Anemia in chronic kidney disease: Secondary | ICD-10-CM | POA: Diagnosis not present

## 2016-10-09 DIAGNOSIS — E1129 Type 2 diabetes mellitus with other diabetic kidney complication: Secondary | ICD-10-CM | POA: Diagnosis not present

## 2016-10-09 DIAGNOSIS — E875 Hyperkalemia: Secondary | ICD-10-CM | POA: Diagnosis not present

## 2016-10-09 DIAGNOSIS — N186 End stage renal disease: Secondary | ICD-10-CM | POA: Diagnosis not present

## 2016-10-09 DIAGNOSIS — N2581 Secondary hyperparathyroidism of renal origin: Secondary | ICD-10-CM | POA: Diagnosis not present

## 2016-10-11 DIAGNOSIS — N2581 Secondary hyperparathyroidism of renal origin: Secondary | ICD-10-CM | POA: Diagnosis not present

## 2016-10-11 DIAGNOSIS — E875 Hyperkalemia: Secondary | ICD-10-CM | POA: Diagnosis not present

## 2016-10-11 DIAGNOSIS — E1129 Type 2 diabetes mellitus with other diabetic kidney complication: Secondary | ICD-10-CM | POA: Diagnosis not present

## 2016-10-11 DIAGNOSIS — D631 Anemia in chronic kidney disease: Secondary | ICD-10-CM | POA: Diagnosis not present

## 2016-10-11 DIAGNOSIS — N186 End stage renal disease: Secondary | ICD-10-CM | POA: Diagnosis not present

## 2016-10-14 DIAGNOSIS — D631 Anemia in chronic kidney disease: Secondary | ICD-10-CM | POA: Diagnosis not present

## 2016-10-14 DIAGNOSIS — N2581 Secondary hyperparathyroidism of renal origin: Secondary | ICD-10-CM | POA: Diagnosis not present

## 2016-10-14 DIAGNOSIS — E875 Hyperkalemia: Secondary | ICD-10-CM | POA: Diagnosis not present

## 2016-10-14 DIAGNOSIS — N186 End stage renal disease: Secondary | ICD-10-CM | POA: Diagnosis not present

## 2016-10-14 DIAGNOSIS — E1129 Type 2 diabetes mellitus with other diabetic kidney complication: Secondary | ICD-10-CM | POA: Diagnosis not present

## 2016-10-16 DIAGNOSIS — N2581 Secondary hyperparathyroidism of renal origin: Secondary | ICD-10-CM | POA: Diagnosis not present

## 2016-10-16 DIAGNOSIS — E875 Hyperkalemia: Secondary | ICD-10-CM | POA: Diagnosis not present

## 2016-10-16 DIAGNOSIS — N186 End stage renal disease: Secondary | ICD-10-CM | POA: Diagnosis not present

## 2016-10-16 DIAGNOSIS — E1129 Type 2 diabetes mellitus with other diabetic kidney complication: Secondary | ICD-10-CM | POA: Diagnosis not present

## 2016-10-16 DIAGNOSIS — D631 Anemia in chronic kidney disease: Secondary | ICD-10-CM | POA: Diagnosis not present

## 2016-10-17 DIAGNOSIS — Z6825 Body mass index (BMI) 25.0-25.9, adult: Secondary | ICD-10-CM | POA: Diagnosis not present

## 2016-10-17 DIAGNOSIS — Z124 Encounter for screening for malignant neoplasm of cervix: Secondary | ICD-10-CM | POA: Diagnosis not present

## 2016-10-18 DIAGNOSIS — E875 Hyperkalemia: Secondary | ICD-10-CM | POA: Diagnosis not present

## 2016-10-18 DIAGNOSIS — N2581 Secondary hyperparathyroidism of renal origin: Secondary | ICD-10-CM | POA: Diagnosis not present

## 2016-10-18 DIAGNOSIS — E1129 Type 2 diabetes mellitus with other diabetic kidney complication: Secondary | ICD-10-CM | POA: Diagnosis not present

## 2016-10-18 DIAGNOSIS — D631 Anemia in chronic kidney disease: Secondary | ICD-10-CM | POA: Diagnosis not present

## 2016-10-18 DIAGNOSIS — N186 End stage renal disease: Secondary | ICD-10-CM | POA: Diagnosis not present

## 2016-10-21 DIAGNOSIS — N2581 Secondary hyperparathyroidism of renal origin: Secondary | ICD-10-CM | POA: Diagnosis not present

## 2016-10-21 DIAGNOSIS — D631 Anemia in chronic kidney disease: Secondary | ICD-10-CM | POA: Diagnosis not present

## 2016-10-21 DIAGNOSIS — N186 End stage renal disease: Secondary | ICD-10-CM | POA: Diagnosis not present

## 2016-10-21 DIAGNOSIS — E875 Hyperkalemia: Secondary | ICD-10-CM | POA: Diagnosis not present

## 2016-10-21 DIAGNOSIS — E1129 Type 2 diabetes mellitus with other diabetic kidney complication: Secondary | ICD-10-CM | POA: Diagnosis not present

## 2016-10-22 DIAGNOSIS — M79676 Pain in unspecified toe(s): Secondary | ICD-10-CM | POA: Diagnosis not present

## 2016-10-22 DIAGNOSIS — B351 Tinea unguium: Secondary | ICD-10-CM | POA: Diagnosis not present

## 2016-10-22 DIAGNOSIS — E1151 Type 2 diabetes mellitus with diabetic peripheral angiopathy without gangrene: Secondary | ICD-10-CM | POA: Diagnosis not present

## 2016-10-23 DIAGNOSIS — E1129 Type 2 diabetes mellitus with other diabetic kidney complication: Secondary | ICD-10-CM | POA: Diagnosis not present

## 2016-10-23 DIAGNOSIS — N2581 Secondary hyperparathyroidism of renal origin: Secondary | ICD-10-CM | POA: Diagnosis not present

## 2016-10-23 DIAGNOSIS — D631 Anemia in chronic kidney disease: Secondary | ICD-10-CM | POA: Diagnosis not present

## 2016-10-23 DIAGNOSIS — E875 Hyperkalemia: Secondary | ICD-10-CM | POA: Diagnosis not present

## 2016-10-23 DIAGNOSIS — N186 End stage renal disease: Secondary | ICD-10-CM | POA: Diagnosis not present

## 2016-10-24 DIAGNOSIS — Z1231 Encounter for screening mammogram for malignant neoplasm of breast: Secondary | ICD-10-CM | POA: Diagnosis not present

## 2016-10-24 DIAGNOSIS — E1129 Type 2 diabetes mellitus with other diabetic kidney complication: Secondary | ICD-10-CM | POA: Diagnosis not present

## 2016-10-24 DIAGNOSIS — Z992 Dependence on renal dialysis: Secondary | ICD-10-CM | POA: Diagnosis not present

## 2016-10-24 DIAGNOSIS — N186 End stage renal disease: Secondary | ICD-10-CM | POA: Diagnosis not present

## 2016-10-25 DIAGNOSIS — N2581 Secondary hyperparathyroidism of renal origin: Secondary | ICD-10-CM | POA: Diagnosis not present

## 2016-10-25 DIAGNOSIS — D631 Anemia in chronic kidney disease: Secondary | ICD-10-CM | POA: Diagnosis not present

## 2016-10-25 DIAGNOSIS — E1129 Type 2 diabetes mellitus with other diabetic kidney complication: Secondary | ICD-10-CM | POA: Diagnosis not present

## 2016-10-25 DIAGNOSIS — N186 End stage renal disease: Secondary | ICD-10-CM | POA: Diagnosis not present

## 2016-10-25 DIAGNOSIS — E875 Hyperkalemia: Secondary | ICD-10-CM | POA: Diagnosis not present

## 2016-10-28 DIAGNOSIS — E875 Hyperkalemia: Secondary | ICD-10-CM | POA: Diagnosis not present

## 2016-10-28 DIAGNOSIS — N186 End stage renal disease: Secondary | ICD-10-CM | POA: Diagnosis not present

## 2016-10-28 DIAGNOSIS — N2581 Secondary hyperparathyroidism of renal origin: Secondary | ICD-10-CM | POA: Diagnosis not present

## 2016-10-28 DIAGNOSIS — D631 Anemia in chronic kidney disease: Secondary | ICD-10-CM | POA: Diagnosis not present

## 2016-10-28 DIAGNOSIS — E1129 Type 2 diabetes mellitus with other diabetic kidney complication: Secondary | ICD-10-CM | POA: Diagnosis not present

## 2016-10-29 DIAGNOSIS — E1065 Type 1 diabetes mellitus with hyperglycemia: Secondary | ICD-10-CM | POA: Diagnosis not present

## 2016-10-30 DIAGNOSIS — E1129 Type 2 diabetes mellitus with other diabetic kidney complication: Secondary | ICD-10-CM | POA: Diagnosis not present

## 2016-10-30 DIAGNOSIS — N186 End stage renal disease: Secondary | ICD-10-CM | POA: Diagnosis not present

## 2016-10-30 DIAGNOSIS — N2581 Secondary hyperparathyroidism of renal origin: Secondary | ICD-10-CM | POA: Diagnosis not present

## 2016-10-30 DIAGNOSIS — E875 Hyperkalemia: Secondary | ICD-10-CM | POA: Diagnosis not present

## 2016-10-30 DIAGNOSIS — D631 Anemia in chronic kidney disease: Secondary | ICD-10-CM | POA: Diagnosis not present

## 2016-11-01 DIAGNOSIS — E875 Hyperkalemia: Secondary | ICD-10-CM | POA: Diagnosis not present

## 2016-11-01 DIAGNOSIS — N186 End stage renal disease: Secondary | ICD-10-CM | POA: Diagnosis not present

## 2016-11-01 DIAGNOSIS — N2581 Secondary hyperparathyroidism of renal origin: Secondary | ICD-10-CM | POA: Diagnosis not present

## 2016-11-01 DIAGNOSIS — E1129 Type 2 diabetes mellitus with other diabetic kidney complication: Secondary | ICD-10-CM | POA: Diagnosis not present

## 2016-11-01 DIAGNOSIS — D631 Anemia in chronic kidney disease: Secondary | ICD-10-CM | POA: Diagnosis not present

## 2016-11-04 DIAGNOSIS — E875 Hyperkalemia: Secondary | ICD-10-CM | POA: Diagnosis not present

## 2016-11-04 DIAGNOSIS — D631 Anemia in chronic kidney disease: Secondary | ICD-10-CM | POA: Diagnosis not present

## 2016-11-04 DIAGNOSIS — N2581 Secondary hyperparathyroidism of renal origin: Secondary | ICD-10-CM | POA: Diagnosis not present

## 2016-11-04 DIAGNOSIS — N186 End stage renal disease: Secondary | ICD-10-CM | POA: Diagnosis not present

## 2016-11-04 DIAGNOSIS — E1129 Type 2 diabetes mellitus with other diabetic kidney complication: Secondary | ICD-10-CM | POA: Diagnosis not present

## 2016-11-06 DIAGNOSIS — E1129 Type 2 diabetes mellitus with other diabetic kidney complication: Secondary | ICD-10-CM | POA: Diagnosis not present

## 2016-11-06 DIAGNOSIS — N2581 Secondary hyperparathyroidism of renal origin: Secondary | ICD-10-CM | POA: Diagnosis not present

## 2016-11-06 DIAGNOSIS — N186 End stage renal disease: Secondary | ICD-10-CM | POA: Diagnosis not present

## 2016-11-06 DIAGNOSIS — D631 Anemia in chronic kidney disease: Secondary | ICD-10-CM | POA: Diagnosis not present

## 2016-11-06 DIAGNOSIS — E875 Hyperkalemia: Secondary | ICD-10-CM | POA: Diagnosis not present

## 2016-11-07 DIAGNOSIS — E1042 Type 1 diabetes mellitus with diabetic polyneuropathy: Secondary | ICD-10-CM | POA: Diagnosis not present

## 2016-11-07 DIAGNOSIS — I2510 Atherosclerotic heart disease of native coronary artery without angina pectoris: Secondary | ICD-10-CM | POA: Diagnosis not present

## 2016-11-07 DIAGNOSIS — N186 End stage renal disease: Secondary | ICD-10-CM | POA: Diagnosis not present

## 2016-11-07 DIAGNOSIS — E1065 Type 1 diabetes mellitus with hyperglycemia: Secondary | ICD-10-CM | POA: Diagnosis not present

## 2016-11-08 DIAGNOSIS — N2581 Secondary hyperparathyroidism of renal origin: Secondary | ICD-10-CM | POA: Diagnosis not present

## 2016-11-08 DIAGNOSIS — N186 End stage renal disease: Secondary | ICD-10-CM | POA: Diagnosis not present

## 2016-11-08 DIAGNOSIS — E875 Hyperkalemia: Secondary | ICD-10-CM | POA: Diagnosis not present

## 2016-11-08 DIAGNOSIS — D631 Anemia in chronic kidney disease: Secondary | ICD-10-CM | POA: Diagnosis not present

## 2016-11-08 DIAGNOSIS — E1129 Type 2 diabetes mellitus with other diabetic kidney complication: Secondary | ICD-10-CM | POA: Diagnosis not present

## 2016-11-11 DIAGNOSIS — E875 Hyperkalemia: Secondary | ICD-10-CM | POA: Diagnosis not present

## 2016-11-11 DIAGNOSIS — E1129 Type 2 diabetes mellitus with other diabetic kidney complication: Secondary | ICD-10-CM | POA: Diagnosis not present

## 2016-11-11 DIAGNOSIS — N186 End stage renal disease: Secondary | ICD-10-CM | POA: Diagnosis not present

## 2016-11-11 DIAGNOSIS — D631 Anemia in chronic kidney disease: Secondary | ICD-10-CM | POA: Diagnosis not present

## 2016-11-11 DIAGNOSIS — N2581 Secondary hyperparathyroidism of renal origin: Secondary | ICD-10-CM | POA: Diagnosis not present

## 2016-11-13 DIAGNOSIS — E1129 Type 2 diabetes mellitus with other diabetic kidney complication: Secondary | ICD-10-CM | POA: Diagnosis not present

## 2016-11-13 DIAGNOSIS — N2581 Secondary hyperparathyroidism of renal origin: Secondary | ICD-10-CM | POA: Diagnosis not present

## 2016-11-13 DIAGNOSIS — D631 Anemia in chronic kidney disease: Secondary | ICD-10-CM | POA: Diagnosis not present

## 2016-11-13 DIAGNOSIS — E875 Hyperkalemia: Secondary | ICD-10-CM | POA: Diagnosis not present

## 2016-11-13 DIAGNOSIS — N186 End stage renal disease: Secondary | ICD-10-CM | POA: Diagnosis not present

## 2016-11-15 DIAGNOSIS — N2581 Secondary hyperparathyroidism of renal origin: Secondary | ICD-10-CM | POA: Diagnosis not present

## 2016-11-15 DIAGNOSIS — E875 Hyperkalemia: Secondary | ICD-10-CM | POA: Diagnosis not present

## 2016-11-15 DIAGNOSIS — E1129 Type 2 diabetes mellitus with other diabetic kidney complication: Secondary | ICD-10-CM | POA: Diagnosis not present

## 2016-11-15 DIAGNOSIS — N186 End stage renal disease: Secondary | ICD-10-CM | POA: Diagnosis not present

## 2016-11-15 DIAGNOSIS — D631 Anemia in chronic kidney disease: Secondary | ICD-10-CM | POA: Diagnosis not present

## 2016-11-18 DIAGNOSIS — E1129 Type 2 diabetes mellitus with other diabetic kidney complication: Secondary | ICD-10-CM | POA: Diagnosis not present

## 2016-11-18 DIAGNOSIS — N186 End stage renal disease: Secondary | ICD-10-CM | POA: Diagnosis not present

## 2016-11-18 DIAGNOSIS — D631 Anemia in chronic kidney disease: Secondary | ICD-10-CM | POA: Diagnosis not present

## 2016-11-18 DIAGNOSIS — E875 Hyperkalemia: Secondary | ICD-10-CM | POA: Diagnosis not present

## 2016-11-18 DIAGNOSIS — N2581 Secondary hyperparathyroidism of renal origin: Secondary | ICD-10-CM | POA: Diagnosis not present

## 2016-11-19 DIAGNOSIS — M722 Plantar fascial fibromatosis: Secondary | ICD-10-CM | POA: Diagnosis not present

## 2016-11-20 DIAGNOSIS — E875 Hyperkalemia: Secondary | ICD-10-CM | POA: Diagnosis not present

## 2016-11-20 DIAGNOSIS — D631 Anemia in chronic kidney disease: Secondary | ICD-10-CM | POA: Diagnosis not present

## 2016-11-20 DIAGNOSIS — E1129 Type 2 diabetes mellitus with other diabetic kidney complication: Secondary | ICD-10-CM | POA: Diagnosis not present

## 2016-11-20 DIAGNOSIS — N186 End stage renal disease: Secondary | ICD-10-CM | POA: Diagnosis not present

## 2016-11-20 DIAGNOSIS — N2581 Secondary hyperparathyroidism of renal origin: Secondary | ICD-10-CM | POA: Diagnosis not present

## 2016-11-22 DIAGNOSIS — N2581 Secondary hyperparathyroidism of renal origin: Secondary | ICD-10-CM | POA: Diagnosis not present

## 2016-11-22 DIAGNOSIS — D631 Anemia in chronic kidney disease: Secondary | ICD-10-CM | POA: Diagnosis not present

## 2016-11-22 DIAGNOSIS — E1129 Type 2 diabetes mellitus with other diabetic kidney complication: Secondary | ICD-10-CM | POA: Diagnosis not present

## 2016-11-22 DIAGNOSIS — E875 Hyperkalemia: Secondary | ICD-10-CM | POA: Diagnosis not present

## 2016-11-22 DIAGNOSIS — N186 End stage renal disease: Secondary | ICD-10-CM | POA: Diagnosis not present

## 2016-11-23 DIAGNOSIS — Z992 Dependence on renal dialysis: Secondary | ICD-10-CM | POA: Diagnosis not present

## 2016-11-23 DIAGNOSIS — N186 End stage renal disease: Secondary | ICD-10-CM | POA: Diagnosis not present

## 2016-11-23 DIAGNOSIS — E1129 Type 2 diabetes mellitus with other diabetic kidney complication: Secondary | ICD-10-CM | POA: Diagnosis not present

## 2016-11-25 DIAGNOSIS — E875 Hyperkalemia: Secondary | ICD-10-CM | POA: Diagnosis not present

## 2016-11-25 DIAGNOSIS — D631 Anemia in chronic kidney disease: Secondary | ICD-10-CM | POA: Diagnosis not present

## 2016-11-25 DIAGNOSIS — E1129 Type 2 diabetes mellitus with other diabetic kidney complication: Secondary | ICD-10-CM | POA: Diagnosis not present

## 2016-11-25 DIAGNOSIS — N2581 Secondary hyperparathyroidism of renal origin: Secondary | ICD-10-CM | POA: Diagnosis not present

## 2016-11-25 DIAGNOSIS — N186 End stage renal disease: Secondary | ICD-10-CM | POA: Diagnosis not present

## 2016-11-27 DIAGNOSIS — D631 Anemia in chronic kidney disease: Secondary | ICD-10-CM | POA: Diagnosis not present

## 2016-11-27 DIAGNOSIS — E875 Hyperkalemia: Secondary | ICD-10-CM | POA: Diagnosis not present

## 2016-11-27 DIAGNOSIS — N2581 Secondary hyperparathyroidism of renal origin: Secondary | ICD-10-CM | POA: Diagnosis not present

## 2016-11-27 DIAGNOSIS — E1129 Type 2 diabetes mellitus with other diabetic kidney complication: Secondary | ICD-10-CM | POA: Diagnosis not present

## 2016-11-27 DIAGNOSIS — N186 End stage renal disease: Secondary | ICD-10-CM | POA: Diagnosis not present

## 2016-11-29 DIAGNOSIS — E1129 Type 2 diabetes mellitus with other diabetic kidney complication: Secondary | ICD-10-CM | POA: Diagnosis not present

## 2016-11-29 DIAGNOSIS — D631 Anemia in chronic kidney disease: Secondary | ICD-10-CM | POA: Diagnosis not present

## 2016-11-29 DIAGNOSIS — N186 End stage renal disease: Secondary | ICD-10-CM | POA: Diagnosis not present

## 2016-11-29 DIAGNOSIS — E875 Hyperkalemia: Secondary | ICD-10-CM | POA: Diagnosis not present

## 2016-11-29 DIAGNOSIS — N2581 Secondary hyperparathyroidism of renal origin: Secondary | ICD-10-CM | POA: Diagnosis not present

## 2016-12-02 DIAGNOSIS — D631 Anemia in chronic kidney disease: Secondary | ICD-10-CM | POA: Diagnosis not present

## 2016-12-02 DIAGNOSIS — E1129 Type 2 diabetes mellitus with other diabetic kidney complication: Secondary | ICD-10-CM | POA: Diagnosis not present

## 2016-12-02 DIAGNOSIS — E875 Hyperkalemia: Secondary | ICD-10-CM | POA: Diagnosis not present

## 2016-12-02 DIAGNOSIS — N2581 Secondary hyperparathyroidism of renal origin: Secondary | ICD-10-CM | POA: Diagnosis not present

## 2016-12-02 DIAGNOSIS — N186 End stage renal disease: Secondary | ICD-10-CM | POA: Diagnosis not present

## 2016-12-03 ENCOUNTER — Other Ambulatory Visit: Payer: Self-pay | Admitting: Internal Medicine

## 2016-12-03 DIAGNOSIS — N186 End stage renal disease: Secondary | ICD-10-CM

## 2016-12-04 DIAGNOSIS — E875 Hyperkalemia: Secondary | ICD-10-CM | POA: Diagnosis not present

## 2016-12-04 DIAGNOSIS — D631 Anemia in chronic kidney disease: Secondary | ICD-10-CM | POA: Diagnosis not present

## 2016-12-04 DIAGNOSIS — N186 End stage renal disease: Secondary | ICD-10-CM | POA: Diagnosis not present

## 2016-12-04 DIAGNOSIS — N2581 Secondary hyperparathyroidism of renal origin: Secondary | ICD-10-CM | POA: Diagnosis not present

## 2016-12-04 DIAGNOSIS — E1129 Type 2 diabetes mellitus with other diabetic kidney complication: Secondary | ICD-10-CM | POA: Diagnosis not present

## 2016-12-06 DIAGNOSIS — N186 End stage renal disease: Secondary | ICD-10-CM | POA: Diagnosis not present

## 2016-12-06 DIAGNOSIS — E875 Hyperkalemia: Secondary | ICD-10-CM | POA: Diagnosis not present

## 2016-12-06 DIAGNOSIS — N2581 Secondary hyperparathyroidism of renal origin: Secondary | ICD-10-CM | POA: Diagnosis not present

## 2016-12-06 DIAGNOSIS — D631 Anemia in chronic kidney disease: Secondary | ICD-10-CM | POA: Diagnosis not present

## 2016-12-06 DIAGNOSIS — E1129 Type 2 diabetes mellitus with other diabetic kidney complication: Secondary | ICD-10-CM | POA: Diagnosis not present

## 2016-12-09 DIAGNOSIS — E1129 Type 2 diabetes mellitus with other diabetic kidney complication: Secondary | ICD-10-CM | POA: Diagnosis not present

## 2016-12-09 DIAGNOSIS — N186 End stage renal disease: Secondary | ICD-10-CM | POA: Diagnosis not present

## 2016-12-09 DIAGNOSIS — N2581 Secondary hyperparathyroidism of renal origin: Secondary | ICD-10-CM | POA: Diagnosis not present

## 2016-12-09 DIAGNOSIS — E875 Hyperkalemia: Secondary | ICD-10-CM | POA: Diagnosis not present

## 2016-12-09 DIAGNOSIS — D631 Anemia in chronic kidney disease: Secondary | ICD-10-CM | POA: Diagnosis not present

## 2016-12-10 ENCOUNTER — Ambulatory Visit
Admission: RE | Admit: 2016-12-10 | Discharge: 2016-12-10 | Disposition: A | Discharge status: Home / Self Care | Payer: BLUE CROSS/BLUE SHIELD | Source: Ambulatory Visit | Attending: Internal Medicine | Admitting: Internal Medicine

## 2016-12-10 DIAGNOSIS — N186 End stage renal disease: Secondary | ICD-10-CM

## 2016-12-10 DIAGNOSIS — K824 Cholesterolosis of gallbladder: Secondary | ICD-10-CM | POA: Diagnosis not present

## 2016-12-11 DIAGNOSIS — E1129 Type 2 diabetes mellitus with other diabetic kidney complication: Secondary | ICD-10-CM | POA: Diagnosis not present

## 2016-12-11 DIAGNOSIS — E875 Hyperkalemia: Secondary | ICD-10-CM | POA: Diagnosis not present

## 2016-12-11 DIAGNOSIS — D631 Anemia in chronic kidney disease: Secondary | ICD-10-CM | POA: Diagnosis not present

## 2016-12-11 DIAGNOSIS — N2581 Secondary hyperparathyroidism of renal origin: Secondary | ICD-10-CM | POA: Diagnosis not present

## 2016-12-11 DIAGNOSIS — N186 End stage renal disease: Secondary | ICD-10-CM | POA: Diagnosis not present

## 2016-12-13 DIAGNOSIS — E1129 Type 2 diabetes mellitus with other diabetic kidney complication: Secondary | ICD-10-CM | POA: Diagnosis not present

## 2016-12-13 DIAGNOSIS — N186 End stage renal disease: Secondary | ICD-10-CM | POA: Diagnosis not present

## 2016-12-13 DIAGNOSIS — D631 Anemia in chronic kidney disease: Secondary | ICD-10-CM | POA: Diagnosis not present

## 2016-12-13 DIAGNOSIS — N2581 Secondary hyperparathyroidism of renal origin: Secondary | ICD-10-CM | POA: Diagnosis not present

## 2016-12-13 DIAGNOSIS — E875 Hyperkalemia: Secondary | ICD-10-CM | POA: Diagnosis not present

## 2016-12-16 DIAGNOSIS — E875 Hyperkalemia: Secondary | ICD-10-CM | POA: Diagnosis not present

## 2016-12-16 DIAGNOSIS — N2581 Secondary hyperparathyroidism of renal origin: Secondary | ICD-10-CM | POA: Diagnosis not present

## 2016-12-16 DIAGNOSIS — N186 End stage renal disease: Secondary | ICD-10-CM | POA: Diagnosis not present

## 2016-12-16 DIAGNOSIS — E1129 Type 2 diabetes mellitus with other diabetic kidney complication: Secondary | ICD-10-CM | POA: Diagnosis not present

## 2016-12-16 DIAGNOSIS — D631 Anemia in chronic kidney disease: Secondary | ICD-10-CM | POA: Diagnosis not present

## 2016-12-18 DIAGNOSIS — E875 Hyperkalemia: Secondary | ICD-10-CM | POA: Diagnosis not present

## 2016-12-18 DIAGNOSIS — N2581 Secondary hyperparathyroidism of renal origin: Secondary | ICD-10-CM | POA: Diagnosis not present

## 2016-12-18 DIAGNOSIS — D631 Anemia in chronic kidney disease: Secondary | ICD-10-CM | POA: Diagnosis not present

## 2016-12-18 DIAGNOSIS — E1129 Type 2 diabetes mellitus with other diabetic kidney complication: Secondary | ICD-10-CM | POA: Diagnosis not present

## 2016-12-18 DIAGNOSIS — N186 End stage renal disease: Secondary | ICD-10-CM | POA: Diagnosis not present

## 2016-12-20 DIAGNOSIS — N2581 Secondary hyperparathyroidism of renal origin: Secondary | ICD-10-CM | POA: Diagnosis not present

## 2016-12-20 DIAGNOSIS — E1129 Type 2 diabetes mellitus with other diabetic kidney complication: Secondary | ICD-10-CM | POA: Diagnosis not present

## 2016-12-20 DIAGNOSIS — N186 End stage renal disease: Secondary | ICD-10-CM | POA: Diagnosis not present

## 2016-12-20 DIAGNOSIS — D631 Anemia in chronic kidney disease: Secondary | ICD-10-CM | POA: Diagnosis not present

## 2016-12-20 DIAGNOSIS — E875 Hyperkalemia: Secondary | ICD-10-CM | POA: Diagnosis not present

## 2016-12-23 DIAGNOSIS — N2581 Secondary hyperparathyroidism of renal origin: Secondary | ICD-10-CM | POA: Diagnosis not present

## 2016-12-23 DIAGNOSIS — E1129 Type 2 diabetes mellitus with other diabetic kidney complication: Secondary | ICD-10-CM | POA: Diagnosis not present

## 2016-12-23 DIAGNOSIS — N186 End stage renal disease: Secondary | ICD-10-CM | POA: Diagnosis not present

## 2016-12-23 DIAGNOSIS — D631 Anemia in chronic kidney disease: Secondary | ICD-10-CM | POA: Diagnosis not present

## 2016-12-23 DIAGNOSIS — E875 Hyperkalemia: Secondary | ICD-10-CM | POA: Diagnosis not present

## 2016-12-24 DIAGNOSIS — Z992 Dependence on renal dialysis: Secondary | ICD-10-CM | POA: Diagnosis not present

## 2016-12-24 DIAGNOSIS — E1129 Type 2 diabetes mellitus with other diabetic kidney complication: Secondary | ICD-10-CM | POA: Diagnosis not present

## 2016-12-24 DIAGNOSIS — N186 End stage renal disease: Secondary | ICD-10-CM | POA: Diagnosis not present

## 2016-12-25 DIAGNOSIS — D631 Anemia in chronic kidney disease: Secondary | ICD-10-CM | POA: Diagnosis not present

## 2016-12-25 DIAGNOSIS — E875 Hyperkalemia: Secondary | ICD-10-CM | POA: Diagnosis not present

## 2016-12-25 DIAGNOSIS — E1129 Type 2 diabetes mellitus with other diabetic kidney complication: Secondary | ICD-10-CM | POA: Diagnosis not present

## 2016-12-25 DIAGNOSIS — N186 End stage renal disease: Secondary | ICD-10-CM | POA: Diagnosis not present

## 2016-12-25 DIAGNOSIS — N2581 Secondary hyperparathyroidism of renal origin: Secondary | ICD-10-CM | POA: Diagnosis not present

## 2016-12-27 DIAGNOSIS — D631 Anemia in chronic kidney disease: Secondary | ICD-10-CM | POA: Diagnosis not present

## 2016-12-27 DIAGNOSIS — E1129 Type 2 diabetes mellitus with other diabetic kidney complication: Secondary | ICD-10-CM | POA: Diagnosis not present

## 2016-12-27 DIAGNOSIS — N186 End stage renal disease: Secondary | ICD-10-CM | POA: Diagnosis not present

## 2016-12-27 DIAGNOSIS — N2581 Secondary hyperparathyroidism of renal origin: Secondary | ICD-10-CM | POA: Diagnosis not present

## 2016-12-27 DIAGNOSIS — E875 Hyperkalemia: Secondary | ICD-10-CM | POA: Diagnosis not present

## 2016-12-30 DIAGNOSIS — N2581 Secondary hyperparathyroidism of renal origin: Secondary | ICD-10-CM | POA: Diagnosis not present

## 2016-12-30 DIAGNOSIS — E1129 Type 2 diabetes mellitus with other diabetic kidney complication: Secondary | ICD-10-CM | POA: Diagnosis not present

## 2016-12-30 DIAGNOSIS — D631 Anemia in chronic kidney disease: Secondary | ICD-10-CM | POA: Diagnosis not present

## 2016-12-30 DIAGNOSIS — E875 Hyperkalemia: Secondary | ICD-10-CM | POA: Diagnosis not present

## 2016-12-30 DIAGNOSIS — N186 End stage renal disease: Secondary | ICD-10-CM | POA: Diagnosis not present

## 2017-01-01 DIAGNOSIS — E1129 Type 2 diabetes mellitus with other diabetic kidney complication: Secondary | ICD-10-CM | POA: Diagnosis not present

## 2017-01-01 DIAGNOSIS — N2581 Secondary hyperparathyroidism of renal origin: Secondary | ICD-10-CM | POA: Diagnosis not present

## 2017-01-01 DIAGNOSIS — N186 End stage renal disease: Secondary | ICD-10-CM | POA: Diagnosis not present

## 2017-01-01 DIAGNOSIS — D631 Anemia in chronic kidney disease: Secondary | ICD-10-CM | POA: Diagnosis not present

## 2017-01-01 DIAGNOSIS — E875 Hyperkalemia: Secondary | ICD-10-CM | POA: Diagnosis not present

## 2017-01-02 DIAGNOSIS — L723 Sebaceous cyst: Secondary | ICD-10-CM | POA: Diagnosis not present

## 2017-01-02 DIAGNOSIS — L0291 Cutaneous abscess, unspecified: Secondary | ICD-10-CM | POA: Diagnosis not present

## 2017-01-03 DIAGNOSIS — D631 Anemia in chronic kidney disease: Secondary | ICD-10-CM | POA: Diagnosis not present

## 2017-01-03 DIAGNOSIS — N2581 Secondary hyperparathyroidism of renal origin: Secondary | ICD-10-CM | POA: Diagnosis not present

## 2017-01-03 DIAGNOSIS — E875 Hyperkalemia: Secondary | ICD-10-CM | POA: Diagnosis not present

## 2017-01-03 DIAGNOSIS — E1129 Type 2 diabetes mellitus with other diabetic kidney complication: Secondary | ICD-10-CM | POA: Diagnosis not present

## 2017-01-03 DIAGNOSIS — N186 End stage renal disease: Secondary | ICD-10-CM | POA: Diagnosis not present

## 2017-01-06 DIAGNOSIS — D631 Anemia in chronic kidney disease: Secondary | ICD-10-CM | POA: Diagnosis not present

## 2017-01-06 DIAGNOSIS — N2581 Secondary hyperparathyroidism of renal origin: Secondary | ICD-10-CM | POA: Diagnosis not present

## 2017-01-06 DIAGNOSIS — E1129 Type 2 diabetes mellitus with other diabetic kidney complication: Secondary | ICD-10-CM | POA: Diagnosis not present

## 2017-01-06 DIAGNOSIS — N186 End stage renal disease: Secondary | ICD-10-CM | POA: Diagnosis not present

## 2017-01-06 DIAGNOSIS — E875 Hyperkalemia: Secondary | ICD-10-CM | POA: Diagnosis not present

## 2017-01-07 ENCOUNTER — Telehealth (INDEPENDENT_AMBULATORY_CARE_PROVIDER_SITE_OTHER): Payer: Self-pay

## 2017-01-07 DIAGNOSIS — H4423 Degenerative myopia, bilateral: Secondary | ICD-10-CM | POA: Diagnosis not present

## 2017-01-07 DIAGNOSIS — E113553 Type 2 diabetes mellitus with stable proliferative diabetic retinopathy, bilateral: Secondary | ICD-10-CM | POA: Diagnosis not present

## 2017-01-07 DIAGNOSIS — H2513 Age-related nuclear cataract, bilateral: Secondary | ICD-10-CM | POA: Diagnosis not present

## 2017-01-07 DIAGNOSIS — H04123 Dry eye syndrome of bilateral lacrimal glands: Secondary | ICD-10-CM | POA: Diagnosis not present

## 2017-01-07 DIAGNOSIS — H35372 Puckering of macula, left eye: Secondary | ICD-10-CM | POA: Diagnosis not present

## 2017-01-07 DIAGNOSIS — H31093 Other chorioretinal scars, bilateral: Secondary | ICD-10-CM | POA: Diagnosis not present

## 2017-01-07 DIAGNOSIS — H50012 Monocular esotropia, left eye: Secondary | ICD-10-CM | POA: Diagnosis not present

## 2017-01-07 DIAGNOSIS — H3582 Retinal ischemia: Secondary | ICD-10-CM | POA: Diagnosis not present

## 2017-01-07 NOTE — Telephone Encounter (Signed)
Complicated pt on kidney transplant list , need OV

## 2017-01-07 NOTE — Telephone Encounter (Signed)
Pt left vm requesting another injection. Had Lt L4-5 IL 12/25/15. Ok to repeat if same side and last one helped?

## 2017-01-08 DIAGNOSIS — N186 End stage renal disease: Secondary | ICD-10-CM | POA: Diagnosis not present

## 2017-01-08 DIAGNOSIS — D631 Anemia in chronic kidney disease: Secondary | ICD-10-CM | POA: Diagnosis not present

## 2017-01-08 DIAGNOSIS — N2581 Secondary hyperparathyroidism of renal origin: Secondary | ICD-10-CM | POA: Diagnosis not present

## 2017-01-08 DIAGNOSIS — E875 Hyperkalemia: Secondary | ICD-10-CM | POA: Diagnosis not present

## 2017-01-08 DIAGNOSIS — E1129 Type 2 diabetes mellitus with other diabetic kidney complication: Secondary | ICD-10-CM | POA: Diagnosis not present

## 2017-01-08 NOTE — Telephone Encounter (Signed)
Scheduled pt for ov on 01/21/17 @ 9:15

## 2017-01-10 DIAGNOSIS — D631 Anemia in chronic kidney disease: Secondary | ICD-10-CM | POA: Diagnosis not present

## 2017-01-10 DIAGNOSIS — E1129 Type 2 diabetes mellitus with other diabetic kidney complication: Secondary | ICD-10-CM | POA: Diagnosis not present

## 2017-01-10 DIAGNOSIS — N186 End stage renal disease: Secondary | ICD-10-CM | POA: Diagnosis not present

## 2017-01-10 DIAGNOSIS — E875 Hyperkalemia: Secondary | ICD-10-CM | POA: Diagnosis not present

## 2017-01-10 DIAGNOSIS — N2581 Secondary hyperparathyroidism of renal origin: Secondary | ICD-10-CM | POA: Diagnosis not present

## 2017-01-13 DIAGNOSIS — E1129 Type 2 diabetes mellitus with other diabetic kidney complication: Secondary | ICD-10-CM | POA: Diagnosis not present

## 2017-01-13 DIAGNOSIS — E875 Hyperkalemia: Secondary | ICD-10-CM | POA: Diagnosis not present

## 2017-01-13 DIAGNOSIS — D631 Anemia in chronic kidney disease: Secondary | ICD-10-CM | POA: Diagnosis not present

## 2017-01-13 DIAGNOSIS — N2581 Secondary hyperparathyroidism of renal origin: Secondary | ICD-10-CM | POA: Diagnosis not present

## 2017-01-13 DIAGNOSIS — N186 End stage renal disease: Secondary | ICD-10-CM | POA: Diagnosis not present

## 2017-01-15 DIAGNOSIS — N2581 Secondary hyperparathyroidism of renal origin: Secondary | ICD-10-CM | POA: Diagnosis not present

## 2017-01-15 DIAGNOSIS — E1129 Type 2 diabetes mellitus with other diabetic kidney complication: Secondary | ICD-10-CM | POA: Diagnosis not present

## 2017-01-15 DIAGNOSIS — N186 End stage renal disease: Secondary | ICD-10-CM | POA: Diagnosis not present

## 2017-01-15 DIAGNOSIS — E875 Hyperkalemia: Secondary | ICD-10-CM | POA: Diagnosis not present

## 2017-01-15 DIAGNOSIS — D631 Anemia in chronic kidney disease: Secondary | ICD-10-CM | POA: Diagnosis not present

## 2017-01-17 DIAGNOSIS — E875 Hyperkalemia: Secondary | ICD-10-CM | POA: Diagnosis not present

## 2017-01-17 DIAGNOSIS — E1129 Type 2 diabetes mellitus with other diabetic kidney complication: Secondary | ICD-10-CM | POA: Diagnosis not present

## 2017-01-17 DIAGNOSIS — N186 End stage renal disease: Secondary | ICD-10-CM | POA: Diagnosis not present

## 2017-01-17 DIAGNOSIS — N2581 Secondary hyperparathyroidism of renal origin: Secondary | ICD-10-CM | POA: Diagnosis not present

## 2017-01-17 DIAGNOSIS — D631 Anemia in chronic kidney disease: Secondary | ICD-10-CM | POA: Diagnosis not present

## 2017-01-20 DIAGNOSIS — N186 End stage renal disease: Secondary | ICD-10-CM | POA: Diagnosis not present

## 2017-01-20 DIAGNOSIS — D631 Anemia in chronic kidney disease: Secondary | ICD-10-CM | POA: Diagnosis not present

## 2017-01-20 DIAGNOSIS — E875 Hyperkalemia: Secondary | ICD-10-CM | POA: Diagnosis not present

## 2017-01-20 DIAGNOSIS — E1129 Type 2 diabetes mellitus with other diabetic kidney complication: Secondary | ICD-10-CM | POA: Diagnosis not present

## 2017-01-20 DIAGNOSIS — N2581 Secondary hyperparathyroidism of renal origin: Secondary | ICD-10-CM | POA: Diagnosis not present

## 2017-01-21 ENCOUNTER — Ambulatory Visit (INDEPENDENT_AMBULATORY_CARE_PROVIDER_SITE_OTHER): Payer: Medicare Other | Admitting: Physical Medicine and Rehabilitation

## 2017-01-21 ENCOUNTER — Encounter (INDEPENDENT_AMBULATORY_CARE_PROVIDER_SITE_OTHER): Payer: Self-pay | Admitting: Physical Medicine and Rehabilitation

## 2017-01-21 VITALS — BP 197/96 | HR 68

## 2017-01-21 DIAGNOSIS — M48062 Spinal stenosis, lumbar region with neurogenic claudication: Secondary | ICD-10-CM

## 2017-01-21 DIAGNOSIS — Q7649 Other congenital malformations of spine, not associated with scoliosis: Secondary | ICD-10-CM | POA: Diagnosis not present

## 2017-01-21 NOTE — Progress Notes (Deleted)
Lower back did not hurt for a while after last injection in July of 2017. Started hurting again a few months ago. Reports a lot of muscle spasms. Pain is in the middle of her lower back, but goes to both sides of the lower back. Reports she has pain, numbness, and tingling in legs very rarely. Cannot tell that it's worse with any activity or positions. Pain is constant.

## 2017-01-22 DIAGNOSIS — N186 End stage renal disease: Secondary | ICD-10-CM | POA: Diagnosis not present

## 2017-01-22 DIAGNOSIS — N2581 Secondary hyperparathyroidism of renal origin: Secondary | ICD-10-CM | POA: Diagnosis not present

## 2017-01-22 DIAGNOSIS — E875 Hyperkalemia: Secondary | ICD-10-CM | POA: Diagnosis not present

## 2017-01-22 DIAGNOSIS — D631 Anemia in chronic kidney disease: Secondary | ICD-10-CM | POA: Diagnosis not present

## 2017-01-22 DIAGNOSIS — E1129 Type 2 diabetes mellitus with other diabetic kidney complication: Secondary | ICD-10-CM | POA: Diagnosis not present

## 2017-01-24 DIAGNOSIS — N186 End stage renal disease: Secondary | ICD-10-CM | POA: Diagnosis not present

## 2017-01-24 DIAGNOSIS — Z992 Dependence on renal dialysis: Secondary | ICD-10-CM | POA: Diagnosis not present

## 2017-01-24 DIAGNOSIS — D631 Anemia in chronic kidney disease: Secondary | ICD-10-CM | POA: Diagnosis not present

## 2017-01-24 DIAGNOSIS — E1129 Type 2 diabetes mellitus with other diabetic kidney complication: Secondary | ICD-10-CM | POA: Diagnosis not present

## 2017-01-24 DIAGNOSIS — N2581 Secondary hyperparathyroidism of renal origin: Secondary | ICD-10-CM | POA: Diagnosis not present

## 2017-01-24 DIAGNOSIS — E875 Hyperkalemia: Secondary | ICD-10-CM | POA: Diagnosis not present

## 2017-01-27 DIAGNOSIS — N186 End stage renal disease: Secondary | ICD-10-CM | POA: Diagnosis not present

## 2017-01-27 DIAGNOSIS — D631 Anemia in chronic kidney disease: Secondary | ICD-10-CM | POA: Diagnosis not present

## 2017-01-27 DIAGNOSIS — N2581 Secondary hyperparathyroidism of renal origin: Secondary | ICD-10-CM | POA: Diagnosis not present

## 2017-01-27 DIAGNOSIS — E875 Hyperkalemia: Secondary | ICD-10-CM | POA: Diagnosis not present

## 2017-01-27 DIAGNOSIS — E1129 Type 2 diabetes mellitus with other diabetic kidney complication: Secondary | ICD-10-CM | POA: Diagnosis not present

## 2017-01-27 DIAGNOSIS — Z23 Encounter for immunization: Secondary | ICD-10-CM | POA: Diagnosis not present

## 2017-01-29 DIAGNOSIS — N2581 Secondary hyperparathyroidism of renal origin: Secondary | ICD-10-CM | POA: Diagnosis not present

## 2017-01-29 DIAGNOSIS — D631 Anemia in chronic kidney disease: Secondary | ICD-10-CM | POA: Diagnosis not present

## 2017-01-29 DIAGNOSIS — N186 End stage renal disease: Secondary | ICD-10-CM | POA: Diagnosis not present

## 2017-01-29 DIAGNOSIS — E1129 Type 2 diabetes mellitus with other diabetic kidney complication: Secondary | ICD-10-CM | POA: Diagnosis not present

## 2017-01-29 DIAGNOSIS — E875 Hyperkalemia: Secondary | ICD-10-CM | POA: Diagnosis not present

## 2017-01-30 ENCOUNTER — Encounter (INDEPENDENT_AMBULATORY_CARE_PROVIDER_SITE_OTHER): Payer: Self-pay | Admitting: Physical Medicine and Rehabilitation

## 2017-01-30 DIAGNOSIS — E113593 Type 2 diabetes mellitus with proliferative diabetic retinopathy without macular edema, bilateral: Secondary | ICD-10-CM | POA: Diagnosis not present

## 2017-01-30 DIAGNOSIS — H25041 Posterior subcapsular polar age-related cataract, right eye: Secondary | ICD-10-CM | POA: Diagnosis not present

## 2017-01-30 DIAGNOSIS — H25042 Posterior subcapsular polar age-related cataract, left eye: Secondary | ICD-10-CM | POA: Diagnosis not present

## 2017-01-30 DIAGNOSIS — E113559 Type 2 diabetes mellitus with stable proliferative diabetic retinopathy, unspecified eye: Secondary | ICD-10-CM | POA: Diagnosis not present

## 2017-01-30 NOTE — Progress Notes (Signed)
[name] - 41 y.o. female MRN [id number]  Date of birth: Oct 08, 1975  Office Visit Note: Visit Date: 01/21/2017 PCP: Merrilee Seashore, MD Referred by: Merrilee Seashore, MD  Subjective: Chief Complaint  Patient presents with  . Lower Back - Pain   HPI: [name] is a 41 year old female with chronic history of low back pain and muscle spasms with rare radicular type pain. We last saw her in 2017 in July and completed an L4-5 intralaminar epidural steroid injection for moderate stenosis at that level. She also has a transitional segment noted on the MRI. MRI is reviewed again today with the patient and below. She comes in today with increasing pain over the last few months. She is reporting a lot of muscle spasms and pain in the middle of the lower back. She reports pain across the lower back when she stands and walks but really cannot tell at times it is worse with any activity or positions. She reports constant pain at this point. Her case is complicated by congestive heart. Coronary artery disease as well as hypertension and diabetes. He does have end-stage renal disease and is on dialysis. She reports the injection last year gave her quite a bit of relief. She reports no new specific injury or focal weakness. No bowel or bladder changes. No trauma.    Review of Systems  Constitutional: Negative for chills, fever, malaise/fatigue and weight loss.  HENT: Negative for hearing loss and sinus pain.   Eyes: Negative for blurred vision, double vision and photophobia.  Respiratory: Negative for cough and shortness of breath.   Cardiovascular: Negative for chest pain, palpitations and leg swelling.  Gastrointestinal: Negative for abdominal pain, nausea and vomiting.  Genitourinary: Negative for flank pain.  Musculoskeletal: Positive for back pain and joint pain. Negative for myalgias.  Skin: Negative for itching and rash.  Neurological: Positive for tingling. Negative for tremors,  focal weakness and weakness.  Endo/Heme/Allergies: Negative.   Psychiatric/Behavioral: Negative for depression.  All other systems reviewed and are negative.  Otherwise per HPI.  Assessment & Plan: Visit Diagnoses:  1. Spinal stenosis of lumbar region with neurogenic claudication   2. Bertolotti's syndrome     Plan: Findings:  Chronic recalcitrant low back pain now with worsening severe low back pain with constant pain really independent of activity level but seemingly worse with standing and walking. She has MRI evidence of moderate stenosis at L4-for which is multifactorial with disc bulging and facet arthropathy. Prior injection in 2017 gave her quite a bit relief. She has multiple medical problems as listed above history above. She really is limited in the medication she can take. She has had physical therapy in the past. She is on dialysis 3 days a week. I think the next step is repeat epidural injection at L4-5. We'll see how she does from that standpoint. We'll get that scheduled. She continues to stay pretty active when she can. We talked at length about activity modification exercises.    Meds & Orders: No orders of the defined types were placed in this encounter.  No orders of the defined types were placed in this encounter.   Follow-up: Return for L4-5 interlaminar epidural steroid injection.   Procedures: No procedures performed  No notes on file   Clinical History: MRI LUMBAR SPINE WITHOUT CONTRAST  TECHNIQUE: Multiplanar, multisequence MR imaging of the lumbar spine was performed. No intravenous contrast was administered.  COMPARISON: CT abdomen and pelvis 05/24/2014.  FINDINGS: Segmentation: Rudimentary disc material  at S1-2 noted. Five lumbar type vertebral bodies are identified.  Alignment: Maintained with straightening of normal lumbar lordosis noted.  Vertebrae: A few small Schmorl's nodes are seen. No fracture or worrisome marrow lesion. The patient  has a somewhat congenitally narrow central canal due to short pedicle length.  Conus medullaris: Extends to the T12-L1 level and appears normal.  Paraspinal and other soft tissues: Unremarkable.  Disc levels: T12-L1 is imaged in the sagittal plane only and negative.  L1-2: Negative.  L2-3: Negative.  L3-4: Shallow disc bulge without central canal or foraminal stenosis.  L3-4: Shallow disc bulge causes moderate central canal narrowing. The foramina are open.  L5-S1: Shallow disc bulge with mild ligamentum flavum thickening and moderate facet degenerative change. Moderate central canal narrowing is identified. The foramina are open. *F.N. this level should be labeled L4-5  IMPRESSION: Mild to moderate congenital and acquired central canal narrowing at L4-5 where there is a shallow disc bulge.  Moderate congenital and acquired central canal narrowing L5-S1 where there is a shallow disc bulge. Moderate facet degenerative change is also seen at this level.   Electronically Signed By: Inge Rise M.D. On: 11/02/2015 09:54  She reports that she quit smoking about 5 years ago. Her smoking use included Cigarettes. She has a 3.00 pack-year smoking history. She has never used smokeless tobacco.   Recent Labs  02/15/16 1418  HGBA1C 11.2*    Objective:  VS:  HT:    WT:   BMI:     BP:(!) 197/96  HR:68bpm  TEMP: ( )  RESP:  Physical Exam  Constitutional: She is oriented to person, place, and time. She appears well-developed and well-nourished. No distress.  HENT:  Head: Normocephalic and atraumatic.  Nose: Nose normal.  Mouth/Throat: Oropharynx is clear and moist.  Eyes: Pupils are equal, round, and reactive to light. Conjunctivae are normal.  Neck: Neck supple. No tracheal deviation present.  Cardiovascular: Regular rhythm and intact distal pulses.   Pulmonary/Chest: Effort normal. No respiratory distress.  Abdominal: She exhibits no distension. There is  no guarding.  Musculoskeletal:  Patient is a very tall individual she has some difficulty standing from a seated position. She has some balance difficulty. She has no pain with hip rotation. She has good distal strength without clonus. He does have pain with extension of the lumbar spine. She does have some tenderness in the paraspinal musculature but not over the vertebral bodies. No pain over the PSIS.  Neurological: She is alert and oriented to person, place, and time. She exhibits normal muscle tone. Coordination normal.  Skin: Skin is warm. No rash noted. No erythema.  Psychiatric: She has a normal mood and affect. Her behavior is normal.  Nursing note and vitals reviewed.   Ortho Exam Imaging: No results found.  Past Medical/Family/Surgical/Social History: Medications & Allergies reviewed per EMR Patient Active Problem List   Diagnosis Date Noted  . Acute on chronic diastolic CHF (congestive heart failure) ([place]) 02/22/2016  . Fluid overload   . Pericardial effusion   . Ascites 02/15/2016  . DM (diabetes mellitus), type 2 with renal complications ([place]) 09/60/4540  . Pain   . Bimalleolar ankle fracture 02/12/2015  . CAD (coronary artery disease) 02/09/2015  . Peripheral vascular disease ([place])   . Gastroparesis   . Chronic anemia   . Menorrhagia with regular cycle 01/04/2014  . Dysmenorrhea 01/04/2014  . Nausea & vomiting 10/16/2011  . History of noncompliance with medical treatment 05/09/2011  . Chronic UTI 05/09/2011  .  CIN III (cervical intraepithelial neoplasia grade III) with severe dysplasia   . History of abscesses in groins 12/06/2010  . End stage renal disease on dialysis ([place])   . Coronary artery disease   . Hyperlipidemia   . 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 NONDEPENDENT TOBACCO USE DISORDER 12/20/2009  . Essential hypertension, benign 12/20/2009  . NEPHROTIC SYNDROME 12/20/2009  . Shortness of breath 12/20/2009  . Chest pain 12/20/2009   Past Medical History:    Diagnosis Date  . Chronic anemia    2nd to renal disease  . CIN III (cervical intraepithelial neoplasia grade III) with severe dysplasia    S/P LEEP AND CONE  . Coronary artery disease    Status post cardiac catheterization June 2012 scattered coronary artery disease/atherosclerosis with 70-80% stenosis in a small right PDA.  . Diabetic Charcot's joint disease ([place])   . DM (diabetes mellitus) ([place])    Long-term insulin  . End stage renal disease on dialysis ([place]) 05/02/11   [facility]; M; W, F; last time 05/01/11  . Fracture of 5th metatarsal 2016   Right  . Gastroparesis   . 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 History of abscesses in groins 12/06/2010  . Hyperlipidemia    Hypertriglyceridemia 449 HDL 25  . Hypertension   . Migraines    "just on dialysis days"  . Peripheral neuropathy    related to DM  . Peripheral vascular disease ([place])    Tibial occlusive disease evaluated by Dr. Kellie Simmering in August 2011. Medical therapy  . Renal insufficiency    Dialysis since 2012  . Tobacco use disorder    Discontinued March 2012  . Trimalleolar fracture of ankle, closed 02/09/2015   Right   Family History  Problem Relation Age of Onset  . Diabetes Mother   . Hypertension Mother   . Diabetes Father   . Hyperlipidemia Father   . Hypertension Father   . Diabetes Sister   . Stroke Maternal Grandmother   . Diabetes Sister   . Breast cancer Maternal Aunt        Age 41's   Past Surgical History:  Procedure Laterality Date  . AV FISTULA PLACEMENT  04/2010  . CARDIAC CATHETERIZATION [name] 02/10/2015   Procedure: Left Heart Cath and Coronary Angiography;  Surgeon: Dixie Dials, MD;  Location: [facility];  Service: Cardiovascular;  Laterality: [name];  . CARDIAC CATHETERIZATION [name] 02/21/2016   Procedure: Right Heart Cath;  Surgeon: Jolaine Artist, MD;  Location: [facility];  Service: Cardiovascular;  Laterality: [name];  . CERVICAL BIOPSY  W/ LOOP ELECTRODE EXCISION     h/o  . CERVICAL  CONE BIOPSY     h/o  . COLPOSCOPY    . DILATION AND CURETTAGE OF UTERUS  2009  . ORIF ANKLE FRACTURE Right 02/09/2015  . ORIF ANKLE FRACTURE Right 02/16/2015   Procedure: OPEN REDUCTION INTERNAL FIXATION (ORIF) RIGHT ANKLE FRACTURE;  Surgeon: Altamese [place], MD;  Location: [facility];  Service: Orthopedics;  Laterality: Right;   Social History   Occupational History  .  [employer]   Social History Main Topics  . Smoking status: Former Smoker    Packs/day: 0.30    Years: 10.00    Types: Cigarettes    Quit date: 08/05/2011  . Smokeless tobacco: Never Used     Comment: "quit smoking cigarettes 07/2010"  . Alcohol use No  . Drug use: No  . Sexual activity: Not on file

## 2017-01-31 DIAGNOSIS — D631 Anemia in chronic kidney disease: Secondary | ICD-10-CM | POA: Diagnosis not present

## 2017-01-31 DIAGNOSIS — E875 Hyperkalemia: Secondary | ICD-10-CM | POA: Diagnosis not present

## 2017-01-31 DIAGNOSIS — E1129 Type 2 diabetes mellitus with other diabetic kidney complication: Secondary | ICD-10-CM | POA: Diagnosis not present

## 2017-01-31 DIAGNOSIS — N186 End stage renal disease: Secondary | ICD-10-CM | POA: Diagnosis not present

## 2017-01-31 DIAGNOSIS — N2581 Secondary hyperparathyroidism of renal origin: Secondary | ICD-10-CM | POA: Diagnosis not present

## 2017-02-02 ENCOUNTER — Encounter (HOSPITAL_COMMUNITY): Payer: Self-pay

## 2017-02-02 ENCOUNTER — Emergency Department (HOSPITAL_COMMUNITY): Payer: Medicare Other

## 2017-02-02 ENCOUNTER — Emergency Department (HOSPITAL_COMMUNITY)
Admission: EM | Admit: 2017-02-02 | Discharge: 2017-02-02 | Disposition: A | Discharge status: Home / Self Care | Payer: Medicare Other | Attending: Physician Assistant | Admitting: Physician Assistant

## 2017-02-02 DIAGNOSIS — R569 Unspecified convulsions: Secondary | ICD-10-CM

## 2017-02-02 DIAGNOSIS — Y939 Activity, unspecified: Secondary | ICD-10-CM | POA: Diagnosis not present

## 2017-02-02 DIAGNOSIS — I2510 Atherosclerotic heart disease of native coronary artery without angina pectoris: Secondary | ICD-10-CM | POA: Diagnosis not present

## 2017-02-02 DIAGNOSIS — I5032 Chronic diastolic (congestive) heart failure: Secondary | ICD-10-CM | POA: Diagnosis not present

## 2017-02-02 DIAGNOSIS — G600 Hereditary motor and sensory neuropathy: Secondary | ICD-10-CM | POA: Insufficient documentation

## 2017-02-02 DIAGNOSIS — S82892A Other fracture of left lower leg, initial encounter for closed fracture: Secondary | ICD-10-CM

## 2017-02-02 DIAGNOSIS — Z992 Dependence on renal dialysis: Secondary | ICD-10-CM | POA: Insufficient documentation

## 2017-02-02 DIAGNOSIS — Y929 Unspecified place or not applicable: Secondary | ICD-10-CM | POA: Insufficient documentation

## 2017-02-02 DIAGNOSIS — Y999 Unspecified external cause status: Secondary | ICD-10-CM | POA: Insufficient documentation

## 2017-02-02 DIAGNOSIS — R739 Hyperglycemia, unspecified: Secondary | ICD-10-CM | POA: Diagnosis not present

## 2017-02-02 DIAGNOSIS — X58XXXA Exposure to other specified factors, initial encounter: Secondary | ICD-10-CM | POA: Diagnosis not present

## 2017-02-02 DIAGNOSIS — S8292XA Unspecified fracture of left lower leg, initial encounter for closed fracture: Secondary | ICD-10-CM | POA: Insufficient documentation

## 2017-02-02 DIAGNOSIS — I132 Hypertensive heart and chronic kidney disease with heart failure and with stage 5 chronic kidney disease, or end stage renal disease: Secondary | ICD-10-CM | POA: Insufficient documentation

## 2017-02-02 DIAGNOSIS — R55 Syncope and collapse: Secondary | ICD-10-CM | POA: Diagnosis not present

## 2017-02-02 DIAGNOSIS — E161 Other hypoglycemia: Secondary | ICD-10-CM | POA: Diagnosis not present

## 2017-02-02 DIAGNOSIS — Z87891 Personal history of nicotine dependence: Secondary | ICD-10-CM | POA: Insufficient documentation

## 2017-02-02 DIAGNOSIS — I159 Secondary hypertension, unspecified: Secondary | ICD-10-CM | POA: Diagnosis not present

## 2017-02-02 DIAGNOSIS — N186 End stage renal disease: Secondary | ICD-10-CM | POA: Insufficient documentation

## 2017-02-02 DIAGNOSIS — Z79899 Other long term (current) drug therapy: Secondary | ICD-10-CM | POA: Diagnosis not present

## 2017-02-02 DIAGNOSIS — S299XXA Unspecified injury of thorax, initial encounter: Secondary | ICD-10-CM | POA: Diagnosis not present

## 2017-02-02 DIAGNOSIS — S8252XA Displaced fracture of medial malleolus of left tibia, initial encounter for closed fracture: Secondary | ICD-10-CM | POA: Diagnosis not present

## 2017-02-02 DIAGNOSIS — R4182 Altered mental status, unspecified: Secondary | ICD-10-CM | POA: Diagnosis present

## 2017-02-02 LAB — I-STAT TROPONIN, ED: Troponin i, poc: 0.05 ng/mL (ref 0.00–0.08)

## 2017-02-02 LAB — URINALYSIS, ROUTINE W REFLEX MICROSCOPIC
Bilirubin Urine: NEGATIVE
HGB URINE DIPSTICK: NEGATIVE
Ketones, ur: NEGATIVE mg/dL
LEUKOCYTES UA: NEGATIVE
NITRITE: NEGATIVE
PH: 9 — AB (ref 5.0–8.0)
Protein, ur: >=300 mg/dL — AB
SPECIFIC GRAVITY, URINE: 1.008 (ref 1.005–1.030)

## 2017-02-02 LAB — I-STAT BETA HCG BLOOD, ED (MC, WL, AP ONLY)

## 2017-02-02 LAB — CBC WITH DIFFERENTIAL/PLATELET
Basophils Absolute: 0 10*3/uL (ref 0.0–0.1)
Basophils Relative: 0 %
EOS ABS: 0.2 10*3/uL (ref 0.0–0.7)
Eosinophils Relative: 2 %
HCT: 39.7 % (ref 36.0–46.0)
HEMOGLOBIN: 12.4 g/dL (ref 12.0–15.0)
LYMPHS ABS: 1 10*3/uL (ref 0.7–4.0)
Lymphocytes Relative: 14 %
MCH: 30.2 pg (ref 26.0–34.0)
MCHC: 31.2 g/dL (ref 30.0–36.0)
MCV: 96.6 fL (ref 78.0–100.0)
Monocytes Absolute: 0.6 10*3/uL (ref 0.1–1.0)
Monocytes Relative: 7 %
NEUTROS PCT: 77 %
Neutro Abs: 5.7 10*3/uL (ref 1.7–7.7)
Platelets: 102 10*3/uL — ABNORMAL LOW (ref 150–400)
RBC: 4.11 MIL/uL (ref 3.87–5.11)
RDW: 16 % — ABNORMAL HIGH (ref 11.5–15.5)
WBC: 7.4 10*3/uL (ref 4.0–10.5)

## 2017-02-02 LAB — COMPREHENSIVE METABOLIC PANEL
ALT: 11 U/L — AB (ref 14–54)
AST: 16 U/L (ref 15–41)
Albumin: 3.4 g/dL — ABNORMAL LOW (ref 3.5–5.0)
Alkaline Phosphatase: 185 U/L — ABNORMAL HIGH (ref 38–126)
Anion gap: 15 (ref 5–15)
BUN: 43 mg/dL — AB (ref 6–20)
CALCIUM: 9.8 mg/dL (ref 8.9–10.3)
CO2: 27 mmol/L (ref 22–32)
CREATININE: 6.32 mg/dL — AB (ref 0.44–1.00)
Chloride: 98 mmol/L — ABNORMAL LOW (ref 101–111)
GFR calc non Af Amer: 7 mL/min — ABNORMAL LOW (ref >60)
GFR, EST AFRICAN AMERICAN: 9 mL/min — AB (ref >60)
Glucose, Bld: 89 mg/dL (ref 65–99)
Potassium: 4.5 mmol/L (ref 3.5–5.1)
SODIUM: 140 mmol/L (ref 135–145)
Total Bilirubin: 0.9 mg/dL (ref 0.3–1.2)
Total Protein: 6.9 g/dL (ref 6.5–8.1)

## 2017-02-02 LAB — RAPID URINE DRUG SCREEN, HOSP PERFORMED
AMPHETAMINES: NOT DETECTED
BENZODIAZEPINES: NOT DETECTED
Barbiturates: NOT DETECTED
Cocaine: NOT DETECTED
Opiates: NOT DETECTED
TETRAHYDROCANNABINOL: NOT DETECTED

## 2017-02-02 LAB — I-STAT CG4 LACTIC ACID, ED: LACTIC ACID, VENOUS: 0.95 mmol/L (ref 0.5–1.9)

## 2017-02-02 LAB — AMMONIA: Ammonia: 33 umol/L (ref 9–35)

## 2017-02-02 LAB — CK: Total CK: 93 U/L (ref 38–234)

## 2017-02-02 MED ORDER — HYDROCODONE-ACETAMINOPHEN 5-325 MG PO TABS: 1.0000 | ORAL_TABLET | Freq: Four times a day (QID) | ORAL | 0 refills | Status: DC | PRN

## 2017-02-02 MED ORDER — HYDROCODONE-ACETAMINOPHEN 5-325 MG PO TABS
1.0000 | ORAL_TABLET | Freq: Once | ORAL | Status: AC
  Administered 2017-02-02: 1 via ORAL
  Filled 2017-02-02: qty 1

## 2017-02-02 NOTE — ED Notes (Signed)
Pt placed on bedpan

## 2017-02-02 NOTE — ED Notes (Signed)
PER EDP, patient took her home meds of norvasc, labetalol, hydralazine, and clonidine.

## 2017-02-02 NOTE — ED Provider Notes (Signed)
[facility] DEPT Provider Note   CSN: [id number] Arrival date & time: 02/02/17  1035     History   Chief Complaint Chief Complaint  Patient presents with  . Altered Mental Status    HPI [name] is a 41 y.o. female.  HPI [name] is a 41 y.o. femaleWith history of coronary artery disease, diabetes, end-stage renal disease on dialysis, anemia,hypertension, peripheral vascular disease, presents to emergency department complaining of altered mental status. Patient apparently found on the kitchen floor by family this morning. Last seen normal around 6 AM. Patient seemed to be confused. Patient denies any complaints, specifically no headache, chest pain, shortness of breath. She does not remember how she ended up on the floor. She is still having some trouble remembering what happened today and yesterday. She does remember falling yesterday and hurting her left ankle. Her only complaint at this time is pain to the left ankle. She does not remember her last dialysis. Her family is on the way.  Family at bedside. Family member reports hearing patient fall this morning. When he got to her she was on the floor fully convulsing. He states it lasted several minutes, and then she was confused. He reports patient has had frequent falls recently due to her peripheral neuropathy and Charcot joint disease. She fell yesterday as well hurting her left ankle. Patient has had one episode of seizure in the past, related to hypoglycemia. Patient's blood sugar was 68 upon arrival by EMS, but she was found with an empty bottle of food, so family believes she was able to eat something prior to her seizure.  Past Medical History:  Diagnosis Date  . Chronic anemia    2nd to renal disease  . CIN III (cervical intraepithelial neoplasia grade III) with severe dysplasia    S/P LEEP AND CONE  . Coronary artery disease    Status post cardiac catheterization June 2012 scattered coronary artery  disease/atherosclerosis with 70-80% stenosis in a small right PDA.  . Diabetic Charcot's joint disease ([place])   . DM (diabetes mellitus) ([place])    Long-term insulin  . End stage renal disease on dialysis ([place]) 05/02/11   [facility]; M; W, F; last time 05/01/11  . Fracture of 5th metatarsal 2016   Right  . Gastroparesis   . 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 History of abscesses in groins 12/06/2010  . Hyperlipidemia    Hypertriglyceridemia 449 HDL 25  . Hypertension   . Migraines    "just on dialysis days"  . Peripheral neuropathy    related to DM  . Peripheral vascular disease ([address])    Tibial occlusive disease evaluated by Dr. Kellie Simmering in August 2011. Medical therapy  . Renal insufficiency    Dialysis since 2012  . Tobacco use disorder    Discontinued March 2012  . Trimalleolar fracture of ankle, closed 02/09/2015   Right    Patient Active Problem List   Diagnosis Date Noted  . Acute on chronic diastolic CHF (congestive heart failure) ([place]) 02/22/2016  . Fluid overload   . Pericardial effusion   . Ascites 02/15/2016  . DM (diabetes mellitus), type 2 with renal complications ([place]) 40/98/1191  . Pain   . Bimalleolar ankle fracture 02/12/2015  . CAD (coronary artery disease) 02/09/2015  . Peripheral vascular disease ([place])   . Gastroparesis   . Chronic anemia   . Menorrhagia with regular cycle 01/04/2014  . Dysmenorrhea 01/04/2014  . Nausea & vomiting 10/16/2011  . History  of noncompliance with medical treatment 05/09/2011  . Chronic UTI 05/09/2011  . CIN III (cervical intraepithelial neoplasia grade III) with severe dysplasia   . History of abscesses in groins 12/06/2010  . End stage renal disease on dialysis ([place])   . Coronary artery disease   . Hyperlipidemia   . 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 NONDEPENDENT TOBACCO USE DISORDER 12/20/2009  . Essential hypertension, benign 12/20/2009  . NEPHROTIC SYNDROME 12/20/2009  . Shortness of breath 12/20/2009  . Chest pain 12/20/2009     Past Surgical History:  Procedure Laterality Date  . AV FISTULA PLACEMENT  04/2010  . CARDIAC CATHETERIZATION [name] 02/10/2015   Procedure: Left Heart Cath and Coronary Angiography;  Surgeon: Dixie Dials, MD;  Location: [facility];  Service: Cardiovascular;  Laterality: [name];  . CARDIAC CATHETERIZATION [name] 02/21/2016   Procedure: Right Heart Cath;  Surgeon: Jolaine Artist, MD;  Location: [facility];  Service: Cardiovascular;  Laterality: [name];  . CERVICAL BIOPSY  W/ LOOP ELECTRODE EXCISION     h/o  . CERVICAL CONE BIOPSY     h/o  . COLPOSCOPY    . DILATION AND CURETTAGE OF UTERUS  2009  . ORIF ANKLE FRACTURE Right 02/09/2015  . ORIF ANKLE FRACTURE Right 02/16/2015   Procedure: OPEN REDUCTION INTERNAL FIXATION (ORIF) RIGHT ANKLE FRACTURE;  Surgeon: Altamese [place], MD;  Location: [facility];  Service: Orthopedics;  Laterality: Right;    OB History    Gravida Para Term Preterm AB Living   2 1 1   1 1    SAB TAB Ectopic Multiple Live Births                   Home Medications    Prior to Admission medications   Medication Sig Start Date End Date Taking? Authorizing Provider  amLODipine (NORVASC) 10 MG tablet Take 1 tablet (10 mg total) by mouth daily. 02/24/16   Orson Eva, MD  amoxicillin-clavulanate (AUGMENTIN) 500-125 MG tablet Take 1 tablet (500 mg total) by mouth daily. X 7 days, then on Mon-Wed-Fri only after dialysis 02/24/16   Tat, David, MD  atorvastatin (LIPITOR) 40 MG tablet Take 40 mg by mouth daily.    [provider]  cloNIDine (CATAPRES) 0.3 MG tablet Take 1 tablet (0.3 mg total) by mouth 3 (three) times daily. 11/01/14   Debbe Odea, MD  Ferric Citrate (AURYXIA PO) Take 2 tablets by mouth 3 (three) times daily with meals.    [provider]  HUMALOG MIX 50/50 KWIKPEN (50-50) 100 UNIT/ML Kwikpen Inject 26-28 Units into the skin 2 (two) times daily. 28 in the morning and 26 in the evening. 04/29/14   [provider]  hydrALAZINE  (APRESOLINE) 100 MG tablet Take 1 tablet (100 mg total) by mouth every 8 (eight) hours. 02/24/16   Orson Eva, MD  isosorbide mononitrate (IMDUR) 120 MG 24 hr tablet Take 1 tablet (120 mg total) by mouth daily. 02/25/16   Orson Eva, MD  labetalol (NORMODYNE) 300 MG tablet Take 1 tablet (300 mg total) by mouth 3 (three) times daily. 11/01/14   Debbe Odea, MD  RENVELA 800 MG tablet Take 4,800 mg by mouth 3 (three) times daily with meals.  04/17/13   [provider]  traMADol (ULTRAM) 50 MG tablet Take 1 tablet (50 mg total) by mouth every 6 (six) hours as needed for moderate pain (or Headache unrelieved by tylenol). Patient not taking: Reported on 01/21/2017 02/24/16   Orson Eva, MD  Family History Family History  Problem Relation Age of Onset  . Diabetes Mother   . Hypertension Mother   . Diabetes Father   . Hyperlipidemia Father   . Hypertension Father   . Diabetes Sister   . Stroke Maternal Grandmother   . Diabetes Sister   . Breast cancer Maternal Aunt        Age 10's    Social History Social History  Substance Use Topics  . Smoking status: Former Smoker    Packs/day: 0.30    Years: 10.00    Types: Cigarettes    Quit date: 08/05/2011  . Smokeless tobacco: Never Used     Comment: "quit smoking cigarettes 07/2010"  . Alcohol use No     Allergies   Cephalexin and Sulfamethoxazole-trimethoprim   Review of Systems Review of Systems  Constitutional: Negative for chills and fever.  Respiratory: Negative for cough, chest tightness and shortness of breath.   Cardiovascular: Negative for chest pain, palpitations and leg swelling.  Gastrointestinal: Negative for abdominal pain, diarrhea, nausea and vomiting.  Genitourinary: Negative for dysuria, flank pain, pelvic pain, vaginal bleeding, vaginal discharge and vaginal pain.  Musculoskeletal: Positive for arthralgias and joint swelling. Negative for myalgias, neck pain and neck stiffness.  Skin: Negative for rash.    Neurological: Positive for syncope. Negative for dizziness, weakness and headaches.  Psychiatric/Behavioral: Positive for confusion.  All other systems reviewed and are negative.    Physical Exam Updated Vital Signs BP (!) 194/88 (BP Location: Right Arm)   Pulse 71   Temp 97.7 F (36.5 C) (Oral)   Resp (!) 24   Ht 6\' 4"  (1.93 m)   Wt 95.3 kg (210 lb)   LMP 10/09/2015   SpO2 94%   BMI 25.56 kg/m   Physical Exam  Constitutional: She is oriented to person, place, and time. She appears well-developed and well-nourished. No distress.  HENT:  Head: Normocephalic.  Eyes: Conjunctivae are normal.  Neck: Neck supple.  Cardiovascular: Normal rate, regular rhythm and normal heart sounds.   Pulmonary/Chest: Effort normal and breath sounds normal. No respiratory distress. She has no wheezes. She has no rales.  Abdominal: Soft. Bowel sounds are normal. She exhibits no distension. There is no tenderness. There is no rebound.  Musculoskeletal: She exhibits no edema.  Significant swelling to the lateral aspect of the left ankle. Pain with range of motion. Tender to palpation over lateral malleolus. Achilles tendon is intact. Several superficial abrasions to the right toes. DP pulse intact.  Fistula in left arm, good thrill.  Neurological: She is alert and oriented to person, place, and time.  Unable to remember events from today and yesterday 5/5 and equal upper and lower extremity strength bilaterally. Equal grip strength bilaterally. Normal finger to nose and heel to shin. No pronator drift.  Skin: Skin is warm and dry.  Psychiatric: She has a normal mood and affect. Her behavior is normal.  Nursing note and vitals reviewed.    ED Treatments / Results  Labs (all labs ordered are listed, but only abnormal results are displayed) Labs Reviewed  CBC WITH DIFFERENTIAL/PLATELET  COMPREHENSIVE METABOLIC PANEL  AMMONIA  CK  I-STAT CG4 LACTIC ACID, ED  I-STAT TROPONIN, ED  I-STAT BETA  HCG BLOOD, ED ([facility], [facility], [facility] ONLY)    EKG  EKG Interpretation None       Radiology No results found.  Procedures Procedures (including critical care time)  SPLINT APPLICATION Date/Time: 3:61 PM Authorized by: Jeannett Senior A Consent: Verbal  consent obtained. Risks and benefits: risks, benefits and alternatives were discussed Consent given by: patient Splint applied by: orthopedic technician Location details: left ankle Splint type: posterior Post-procedure: The splinted body part was neurovascularly unchanged following the procedure. Patient tolerance: Patient tolerated the procedure well with no immediate complications.    Medications Ordered in ED Medications - No data to display   Initial Impression / Assessment and Plan / ED Course  I have reviewed the triage vital signs and the nursing notes.  Pertinent labs & imaging results that were available during my care of the patient were reviewed by me and considered in my medical decision making (see chart for details).    Patient seen and examined. Patient still slightly confused, feeling now bedside. It is possible the patient had a seizure, unclear the cause, possibly hypoglycemia. We will check labs, CT head, chest x-ray, left ankle x-ray.  1:07 PM Patient's labs are significant for elevated BUN and creatinine, appears to be at baseline, she is a dialysis patient. Platelets slightly low at 102, otherwise normal CBC. Normal CK level, normal ammonia, normal troponin, normal lactic acid. Urinalysis is contaminated with too numerous to count squamous epithelial cells. Patient denies any urinary symptoms, will hold off on treating. We'll send cultures.her x-ray showed small avulsion fracture fragment along the undersurface of medial malleolus, with suspicious nondisplaced fracture over the distal fibula. It also showed slight asymmetry of the ankle mortise, suggestive ligamentous injury. Patient be placed in a splint.  Crutches provided. We'll have her follow up with orthopedic doctor who is Dr. Marlou Sa. Patient's blood pressure is significantly elevated here, she's not sure if she took her home medications this morning, her husband states that she did not. She will take her own medications here, will recheck  BP improving, but still elevates. Ankle splinted. Good cap refill. Will prescribe pain medications. Return precautions discussed.   Vitals:   02/02/17 1115 02/02/17 1300 02/02/17 1315 02/02/17 1415  BP: (!) 207/89 (!) 204/90 (!) 188/98 (!) 193/90  Pulse: 70 67 88 69  Resp:  19 (!) 22 19  Temp:      TempSrc:      SpO2: 94% 96% 100% 95%  Weight:      Height:         Final Clinical Impressions(s) / ED Diagnoses   Final diagnoses:  Seizure ([place])  Closed fracture of left ankle, initial encounter  Secondary hypertension    New Prescriptions Discharge Medication List as of 02/02/2017  2:21 PM    START taking these medications   Details  HYDROcodone-acetaminophen (NORCO) 5-325 MG tablet Take 1 tablet by mouth every 6 (six) hours as needed for moderate pain., Starting Sun 02/02/2017, Print         Jeannett Senior, PA-C 02/02/17 1620    Macarthur Critchley, MD 02/06/17 1008

## 2017-02-02 NOTE — ED Triage Notes (Signed)
Per [facility], Pt is coming from home with reports of found fallen in the kitchen floor by daughter. Pt was disoriented to time and event, but oriented to self. Pt has hx of dialysis. Pt was LSN at 0400 this morning. No neuro deficits. No injury or trauma noted. Swelling notexd to the left ankle due to yesterday's fall. CBG 68 - Pt was given Juice, and her reassessment was CBG of 168. Pt remained confused. Initial BP: 240/100 Last BP: 204/108, 78 HR, 95% on RA.

## 2017-02-02 NOTE — Discharge Instructions (Signed)
Rest. Drink plenty of fluids. Keep close eye on your blood sugars. Take all your medications. Take Tylenol or Motrin for pain. Take Norco for severe pain only. Follow-up with your family doctor for a recheck of your blood pressure and seizures. Follow-up with orthopedic specialist for further evaluation and treatment of your ankle fracture. Return if worsening symptoms.

## 2017-02-02 NOTE — Progress Notes (Signed)
Orthopedic Tech Progress Note Patient Details:  [name] 19-Sep-1975 [id number]  Ortho Devices Type of Ortho Device: Ace wrap, Post (short leg) splint, Stirrup splint Ortho Device/Splint Interventions: Application   [name] 02/02/2017, 1:39 PM

## 2017-02-02 NOTE — ED Notes (Signed)
Ginger ale given to patient. 

## 2017-02-02 NOTE — ED Notes (Signed)
Pt on way to XR and CT

## 2017-02-03 DIAGNOSIS — D631 Anemia in chronic kidney disease: Secondary | ICD-10-CM | POA: Diagnosis not present

## 2017-02-03 DIAGNOSIS — N2581 Secondary hyperparathyroidism of renal origin: Secondary | ICD-10-CM | POA: Diagnosis not present

## 2017-02-03 DIAGNOSIS — E1129 Type 2 diabetes mellitus with other diabetic kidney complication: Secondary | ICD-10-CM | POA: Diagnosis not present

## 2017-02-03 DIAGNOSIS — E875 Hyperkalemia: Secondary | ICD-10-CM | POA: Diagnosis not present

## 2017-02-03 DIAGNOSIS — N186 End stage renal disease: Secondary | ICD-10-CM | POA: Diagnosis not present

## 2017-02-03 LAB — URINE CULTURE

## 2017-02-04 ENCOUNTER — Encounter (INDEPENDENT_AMBULATORY_CARE_PROVIDER_SITE_OTHER): Payer: Medicare Other | Admitting: Physical Medicine and Rehabilitation

## 2017-02-04 DIAGNOSIS — S82892A Other fracture of left lower leg, initial encounter for closed fracture: Secondary | ICD-10-CM | POA: Diagnosis not present

## 2017-02-04 DIAGNOSIS — N186 End stage renal disease: Secondary | ICD-10-CM | POA: Diagnosis not present

## 2017-02-04 DIAGNOSIS — Z992 Dependence on renal dialysis: Secondary | ICD-10-CM | POA: Diagnosis not present

## 2017-02-04 DIAGNOSIS — T82858A Stenosis of vascular prosthetic devices, implants and grafts, initial encounter: Secondary | ICD-10-CM | POA: Diagnosis not present

## 2017-02-05 ENCOUNTER — Ambulatory Visit (INDEPENDENT_AMBULATORY_CARE_PROVIDER_SITE_OTHER): Payer: Medicare Other | Admitting: Orthopedic Surgery

## 2017-02-05 DIAGNOSIS — E875 Hyperkalemia: Secondary | ICD-10-CM | POA: Diagnosis not present

## 2017-02-05 DIAGNOSIS — N186 End stage renal disease: Secondary | ICD-10-CM | POA: Diagnosis not present

## 2017-02-05 DIAGNOSIS — E1129 Type 2 diabetes mellitus with other diabetic kidney complication: Secondary | ICD-10-CM | POA: Diagnosis not present

## 2017-02-05 DIAGNOSIS — N2581 Secondary hyperparathyroidism of renal origin: Secondary | ICD-10-CM | POA: Diagnosis not present

## 2017-02-05 DIAGNOSIS — D631 Anemia in chronic kidney disease: Secondary | ICD-10-CM | POA: Diagnosis not present

## 2017-02-07 DIAGNOSIS — E1129 Type 2 diabetes mellitus with other diabetic kidney complication: Secondary | ICD-10-CM | POA: Diagnosis not present

## 2017-02-07 DIAGNOSIS — N186 End stage renal disease: Secondary | ICD-10-CM | POA: Diagnosis not present

## 2017-02-07 DIAGNOSIS — D631 Anemia in chronic kidney disease: Secondary | ICD-10-CM | POA: Diagnosis not present

## 2017-02-07 DIAGNOSIS — N2581 Secondary hyperparathyroidism of renal origin: Secondary | ICD-10-CM | POA: Diagnosis not present

## 2017-02-07 DIAGNOSIS — E875 Hyperkalemia: Secondary | ICD-10-CM | POA: Diagnosis not present

## 2017-02-10 DIAGNOSIS — D631 Anemia in chronic kidney disease: Secondary | ICD-10-CM | POA: Diagnosis not present

## 2017-02-10 DIAGNOSIS — E875 Hyperkalemia: Secondary | ICD-10-CM | POA: Diagnosis not present

## 2017-02-10 DIAGNOSIS — N186 End stage renal disease: Secondary | ICD-10-CM | POA: Diagnosis not present

## 2017-02-10 DIAGNOSIS — E1129 Type 2 diabetes mellitus with other diabetic kidney complication: Secondary | ICD-10-CM | POA: Diagnosis not present

## 2017-02-10 DIAGNOSIS — N2581 Secondary hyperparathyroidism of renal origin: Secondary | ICD-10-CM | POA: Diagnosis not present

## 2017-02-12 DIAGNOSIS — E875 Hyperkalemia: Secondary | ICD-10-CM | POA: Diagnosis not present

## 2017-02-12 DIAGNOSIS — E1129 Type 2 diabetes mellitus with other diabetic kidney complication: Secondary | ICD-10-CM | POA: Diagnosis not present

## 2017-02-12 DIAGNOSIS — N186 End stage renal disease: Secondary | ICD-10-CM | POA: Diagnosis not present

## 2017-02-12 DIAGNOSIS — D631 Anemia in chronic kidney disease: Secondary | ICD-10-CM | POA: Diagnosis not present

## 2017-02-12 DIAGNOSIS — N2581 Secondary hyperparathyroidism of renal origin: Secondary | ICD-10-CM | POA: Diagnosis not present

## 2017-02-13 DIAGNOSIS — S82892D Other fracture of left lower leg, subsequent encounter for closed fracture with routine healing: Secondary | ICD-10-CM | POA: Diagnosis not present

## 2017-02-14 DIAGNOSIS — D631 Anemia in chronic kidney disease: Secondary | ICD-10-CM | POA: Diagnosis not present

## 2017-02-14 DIAGNOSIS — E875 Hyperkalemia: Secondary | ICD-10-CM | POA: Diagnosis not present

## 2017-02-14 DIAGNOSIS — E1129 Type 2 diabetes mellitus with other diabetic kidney complication: Secondary | ICD-10-CM | POA: Diagnosis not present

## 2017-02-14 DIAGNOSIS — N186 End stage renal disease: Secondary | ICD-10-CM | POA: Diagnosis not present

## 2017-02-14 DIAGNOSIS — N2581 Secondary hyperparathyroidism of renal origin: Secondary | ICD-10-CM | POA: Diagnosis not present

## 2017-02-17 DIAGNOSIS — I110 Hypertensive heart disease with heart failure: Secondary | ICD-10-CM | POA: Diagnosis not present

## 2017-02-17 DIAGNOSIS — N2581 Secondary hyperparathyroidism of renal origin: Secondary | ICD-10-CM | POA: Diagnosis not present

## 2017-02-17 DIAGNOSIS — D631 Anemia in chronic kidney disease: Secondary | ICD-10-CM | POA: Diagnosis not present

## 2017-02-17 DIAGNOSIS — I5032 Chronic diastolic (congestive) heart failure: Secondary | ICD-10-CM | POA: Diagnosis not present

## 2017-02-17 DIAGNOSIS — R188 Other ascites: Secondary | ICD-10-CM | POA: Diagnosis not present

## 2017-02-17 DIAGNOSIS — I2510 Atherosclerotic heart disease of native coronary artery without angina pectoris: Secondary | ICD-10-CM | POA: Diagnosis not present

## 2017-02-17 DIAGNOSIS — E1129 Type 2 diabetes mellitus with other diabetic kidney complication: Secondary | ICD-10-CM | POA: Diagnosis not present

## 2017-02-17 DIAGNOSIS — E875 Hyperkalemia: Secondary | ICD-10-CM | POA: Diagnosis not present

## 2017-02-17 DIAGNOSIS — N186 End stage renal disease: Secondary | ICD-10-CM | POA: Diagnosis not present

## 2017-02-19 DIAGNOSIS — D631 Anemia in chronic kidney disease: Secondary | ICD-10-CM | POA: Diagnosis not present

## 2017-02-19 DIAGNOSIS — N2581 Secondary hyperparathyroidism of renal origin: Secondary | ICD-10-CM | POA: Diagnosis not present

## 2017-02-19 DIAGNOSIS — H2512 Age-related nuclear cataract, left eye: Secondary | ICD-10-CM | POA: Diagnosis not present

## 2017-02-19 DIAGNOSIS — E875 Hyperkalemia: Secondary | ICD-10-CM | POA: Diagnosis not present

## 2017-02-19 DIAGNOSIS — E1129 Type 2 diabetes mellitus with other diabetic kidney complication: Secondary | ICD-10-CM | POA: Diagnosis not present

## 2017-02-19 DIAGNOSIS — H25812 Combined forms of age-related cataract, left eye: Secondary | ICD-10-CM | POA: Diagnosis not present

## 2017-02-19 DIAGNOSIS — N186 End stage renal disease: Secondary | ICD-10-CM | POA: Diagnosis not present

## 2017-02-21 DIAGNOSIS — E875 Hyperkalemia: Secondary | ICD-10-CM | POA: Diagnosis not present

## 2017-02-21 DIAGNOSIS — E1129 Type 2 diabetes mellitus with other diabetic kidney complication: Secondary | ICD-10-CM | POA: Diagnosis not present

## 2017-02-21 DIAGNOSIS — D631 Anemia in chronic kidney disease: Secondary | ICD-10-CM | POA: Diagnosis not present

## 2017-02-21 DIAGNOSIS — N186 End stage renal disease: Secondary | ICD-10-CM | POA: Diagnosis not present

## 2017-02-21 DIAGNOSIS — N2581 Secondary hyperparathyroidism of renal origin: Secondary | ICD-10-CM | POA: Diagnosis not present

## 2017-02-21 DIAGNOSIS — S82892D Other fracture of left lower leg, subsequent encounter for closed fracture with routine healing: Secondary | ICD-10-CM | POA: Diagnosis not present

## 2017-02-23 DIAGNOSIS — N186 End stage renal disease: Secondary | ICD-10-CM | POA: Diagnosis not present

## 2017-02-23 DIAGNOSIS — Z992 Dependence on renal dialysis: Secondary | ICD-10-CM | POA: Diagnosis not present

## 2017-02-23 DIAGNOSIS — E1129 Type 2 diabetes mellitus with other diabetic kidney complication: Secondary | ICD-10-CM | POA: Diagnosis not present

## 2017-02-24 DIAGNOSIS — N186 End stage renal disease: Secondary | ICD-10-CM | POA: Diagnosis not present

## 2017-02-24 DIAGNOSIS — D631 Anemia in chronic kidney disease: Secondary | ICD-10-CM | POA: Diagnosis not present

## 2017-02-24 DIAGNOSIS — E1129 Type 2 diabetes mellitus with other diabetic kidney complication: Secondary | ICD-10-CM | POA: Diagnosis not present

## 2017-02-24 DIAGNOSIS — E875 Hyperkalemia: Secondary | ICD-10-CM | POA: Diagnosis not present

## 2017-02-24 DIAGNOSIS — N2581 Secondary hyperparathyroidism of renal origin: Secondary | ICD-10-CM | POA: Diagnosis not present

## 2017-02-26 DIAGNOSIS — E875 Hyperkalemia: Secondary | ICD-10-CM | POA: Diagnosis not present

## 2017-02-26 DIAGNOSIS — E1129 Type 2 diabetes mellitus with other diabetic kidney complication: Secondary | ICD-10-CM | POA: Diagnosis not present

## 2017-02-26 DIAGNOSIS — D631 Anemia in chronic kidney disease: Secondary | ICD-10-CM | POA: Diagnosis not present

## 2017-02-26 DIAGNOSIS — N2581 Secondary hyperparathyroidism of renal origin: Secondary | ICD-10-CM | POA: Diagnosis not present

## 2017-02-26 DIAGNOSIS — N186 End stage renal disease: Secondary | ICD-10-CM | POA: Diagnosis not present

## 2017-02-28 ENCOUNTER — Telehealth (INDEPENDENT_AMBULATORY_CARE_PROVIDER_SITE_OTHER): Payer: Self-pay

## 2017-02-28 DIAGNOSIS — N2581 Secondary hyperparathyroidism of renal origin: Secondary | ICD-10-CM | POA: Diagnosis not present

## 2017-02-28 DIAGNOSIS — E1129 Type 2 diabetes mellitus with other diabetic kidney complication: Secondary | ICD-10-CM | POA: Diagnosis not present

## 2017-02-28 DIAGNOSIS — E875 Hyperkalemia: Secondary | ICD-10-CM | POA: Diagnosis not present

## 2017-02-28 DIAGNOSIS — N186 End stage renal disease: Secondary | ICD-10-CM | POA: Diagnosis not present

## 2017-02-28 DIAGNOSIS — D631 Anemia in chronic kidney disease: Secondary | ICD-10-CM | POA: Diagnosis not present

## 2017-02-28 NOTE — Telephone Encounter (Signed)
Patient called [facility] stating she had appt scheduled couple weeks ago for injection and had to cancel. She is ready to reschedule at this time [phone]

## 2017-03-03 DIAGNOSIS — N2581 Secondary hyperparathyroidism of renal origin: Secondary | ICD-10-CM | POA: Diagnosis not present

## 2017-03-03 DIAGNOSIS — E875 Hyperkalemia: Secondary | ICD-10-CM | POA: Diagnosis not present

## 2017-03-03 DIAGNOSIS — D631 Anemia in chronic kidney disease: Secondary | ICD-10-CM | POA: Diagnosis not present

## 2017-03-03 DIAGNOSIS — E1129 Type 2 diabetes mellitus with other diabetic kidney complication: Secondary | ICD-10-CM | POA: Diagnosis not present

## 2017-03-03 DIAGNOSIS — N186 End stage renal disease: Secondary | ICD-10-CM | POA: Diagnosis not present

## 2017-03-03 NOTE — Telephone Encounter (Signed)
Called patient an rescheduled her appointment.

## 2017-03-03 NOTE — Telephone Encounter (Signed)
Needs appt please see me so I will know how to do this for future reference.

## 2017-03-05 DIAGNOSIS — E1129 Type 2 diabetes mellitus with other diabetic kidney complication: Secondary | ICD-10-CM | POA: Diagnosis not present

## 2017-03-05 DIAGNOSIS — N2581 Secondary hyperparathyroidism of renal origin: Secondary | ICD-10-CM | POA: Diagnosis not present

## 2017-03-05 DIAGNOSIS — N186 End stage renal disease: Secondary | ICD-10-CM | POA: Diagnosis not present

## 2017-03-05 DIAGNOSIS — E875 Hyperkalemia: Secondary | ICD-10-CM | POA: Diagnosis not present

## 2017-03-05 DIAGNOSIS — D631 Anemia in chronic kidney disease: Secondary | ICD-10-CM | POA: Diagnosis not present

## 2017-03-07 DIAGNOSIS — E875 Hyperkalemia: Secondary | ICD-10-CM | POA: Diagnosis not present

## 2017-03-07 DIAGNOSIS — N186 End stage renal disease: Secondary | ICD-10-CM | POA: Diagnosis not present

## 2017-03-07 DIAGNOSIS — N2581 Secondary hyperparathyroidism of renal origin: Secondary | ICD-10-CM | POA: Diagnosis not present

## 2017-03-07 DIAGNOSIS — D631 Anemia in chronic kidney disease: Secondary | ICD-10-CM | POA: Diagnosis not present

## 2017-03-07 DIAGNOSIS — E1129 Type 2 diabetes mellitus with other diabetic kidney complication: Secondary | ICD-10-CM | POA: Diagnosis not present

## 2017-03-07 DIAGNOSIS — S82892D Other fracture of left lower leg, subsequent encounter for closed fracture with routine healing: Secondary | ICD-10-CM | POA: Diagnosis not present

## 2017-03-10 DIAGNOSIS — E875 Hyperkalemia: Secondary | ICD-10-CM | POA: Diagnosis not present

## 2017-03-10 DIAGNOSIS — E1129 Type 2 diabetes mellitus with other diabetic kidney complication: Secondary | ICD-10-CM | POA: Diagnosis not present

## 2017-03-10 DIAGNOSIS — D631 Anemia in chronic kidney disease: Secondary | ICD-10-CM | POA: Diagnosis not present

## 2017-03-10 DIAGNOSIS — N2581 Secondary hyperparathyroidism of renal origin: Secondary | ICD-10-CM | POA: Diagnosis not present

## 2017-03-10 DIAGNOSIS — N186 End stage renal disease: Secondary | ICD-10-CM | POA: Diagnosis not present

## 2017-03-12 ENCOUNTER — Ambulatory Visit (INDEPENDENT_AMBULATORY_CARE_PROVIDER_SITE_OTHER): Payer: Medicare Other

## 2017-03-12 ENCOUNTER — Ambulatory Visit (INDEPENDENT_AMBULATORY_CARE_PROVIDER_SITE_OTHER): Payer: Medicare Other | Admitting: Physical Medicine and Rehabilitation

## 2017-03-12 ENCOUNTER — Encounter (INDEPENDENT_AMBULATORY_CARE_PROVIDER_SITE_OTHER): Payer: Self-pay | Admitting: Physical Medicine and Rehabilitation

## 2017-03-12 VITALS — BP 191/94 | HR 70 | Temp 98.3°F

## 2017-03-12 DIAGNOSIS — M48062 Spinal stenosis, lumbar region with neurogenic claudication: Secondary | ICD-10-CM

## 2017-03-12 DIAGNOSIS — D631 Anemia in chronic kidney disease: Secondary | ICD-10-CM | POA: Diagnosis not present

## 2017-03-12 DIAGNOSIS — E1129 Type 2 diabetes mellitus with other diabetic kidney complication: Secondary | ICD-10-CM | POA: Diagnosis not present

## 2017-03-12 DIAGNOSIS — N186 End stage renal disease: Secondary | ICD-10-CM | POA: Diagnosis not present

## 2017-03-12 DIAGNOSIS — N2581 Secondary hyperparathyroidism of renal origin: Secondary | ICD-10-CM | POA: Diagnosis not present

## 2017-03-12 DIAGNOSIS — E875 Hyperkalemia: Secondary | ICD-10-CM | POA: Diagnosis not present

## 2017-03-12 MED ORDER — BETAMETHASONE SOD PHOS & ACET 6 (3-3) MG/ML IJ SUSP
12.0000 mg | Freq: Once | INTRAMUSCULAR | Status: AC
  Administered 2017-03-12: 12 mg

## 2017-03-12 MED ORDER — CYCLOBENZAPRINE HCL 10 MG PO TABS: 10.0000 mg | ORAL_TABLET | Freq: Every day | ORAL | 0 refills | Status: DC

## 2017-03-12 MED ORDER — LIDOCAINE HCL (PF) 1 % IJ SOLN
2.0000 mL | Freq: Once | INTRAMUSCULAR | Status: AC
  Administered 2017-03-12: 2 mL

## 2017-03-12 NOTE — Progress Notes (Deleted)
Paitent is having pain and spasms that go up her back to her arms.  +Driver, - Bt's, - Dye Allergy

## 2017-03-12 NOTE — Patient Instructions (Signed)

## 2017-03-14 DIAGNOSIS — D631 Anemia in chronic kidney disease: Secondary | ICD-10-CM | POA: Diagnosis not present

## 2017-03-14 DIAGNOSIS — E875 Hyperkalemia: Secondary | ICD-10-CM | POA: Diagnosis not present

## 2017-03-14 DIAGNOSIS — N2581 Secondary hyperparathyroidism of renal origin: Secondary | ICD-10-CM | POA: Diagnosis not present

## 2017-03-14 DIAGNOSIS — N186 End stage renal disease: Secondary | ICD-10-CM | POA: Diagnosis not present

## 2017-03-14 DIAGNOSIS — E1129 Type 2 diabetes mellitus with other diabetic kidney complication: Secondary | ICD-10-CM | POA: Diagnosis not present

## 2017-03-15 DIAGNOSIS — N2581 Secondary hyperparathyroidism of renal origin: Secondary | ICD-10-CM | POA: Diagnosis not present

## 2017-03-15 DIAGNOSIS — E8770 Fluid overload, unspecified: Secondary | ICD-10-CM | POA: Diagnosis not present

## 2017-03-15 DIAGNOSIS — N186 End stage renal disease: Secondary | ICD-10-CM | POA: Diagnosis not present

## 2017-03-17 DIAGNOSIS — N186 End stage renal disease: Secondary | ICD-10-CM | POA: Diagnosis not present

## 2017-03-17 DIAGNOSIS — E875 Hyperkalemia: Secondary | ICD-10-CM | POA: Diagnosis not present

## 2017-03-17 DIAGNOSIS — H2511 Age-related nuclear cataract, right eye: Secondary | ICD-10-CM | POA: Diagnosis not present

## 2017-03-17 DIAGNOSIS — E1129 Type 2 diabetes mellitus with other diabetic kidney complication: Secondary | ICD-10-CM | POA: Diagnosis not present

## 2017-03-17 DIAGNOSIS — N2581 Secondary hyperparathyroidism of renal origin: Secondary | ICD-10-CM | POA: Diagnosis not present

## 2017-03-17 DIAGNOSIS — D631 Anemia in chronic kidney disease: Secondary | ICD-10-CM | POA: Diagnosis not present

## 2017-03-19 ENCOUNTER — Other Ambulatory Visit (HOSPITAL_COMMUNITY): Payer: Self-pay | Admitting: Nephrology

## 2017-03-19 DIAGNOSIS — E1129 Type 2 diabetes mellitus with other diabetic kidney complication: Secondary | ICD-10-CM | POA: Diagnosis not present

## 2017-03-19 DIAGNOSIS — N186 End stage renal disease: Secondary | ICD-10-CM | POA: Diagnosis not present

## 2017-03-19 DIAGNOSIS — N2581 Secondary hyperparathyroidism of renal origin: Secondary | ICD-10-CM | POA: Diagnosis not present

## 2017-03-19 DIAGNOSIS — D631 Anemia in chronic kidney disease: Secondary | ICD-10-CM | POA: Diagnosis not present

## 2017-03-19 DIAGNOSIS — E875 Hyperkalemia: Secondary | ICD-10-CM | POA: Diagnosis not present

## 2017-03-19 DIAGNOSIS — R188 Other ascites: Secondary | ICD-10-CM

## 2017-03-20 DIAGNOSIS — I871 Compression of vein: Secondary | ICD-10-CM | POA: Diagnosis not present

## 2017-03-20 DIAGNOSIS — T82858A Stenosis of vascular prosthetic devices, implants and grafts, initial encounter: Secondary | ICD-10-CM | POA: Diagnosis not present

## 2017-03-20 DIAGNOSIS — Z992 Dependence on renal dialysis: Secondary | ICD-10-CM | POA: Diagnosis not present

## 2017-03-20 DIAGNOSIS — N186 End stage renal disease: Secondary | ICD-10-CM | POA: Diagnosis not present

## 2017-03-21 DIAGNOSIS — D631 Anemia in chronic kidney disease: Secondary | ICD-10-CM | POA: Diagnosis not present

## 2017-03-21 DIAGNOSIS — N186 End stage renal disease: Secondary | ICD-10-CM | POA: Diagnosis not present

## 2017-03-21 DIAGNOSIS — E1129 Type 2 diabetes mellitus with other diabetic kidney complication: Secondary | ICD-10-CM | POA: Diagnosis not present

## 2017-03-21 DIAGNOSIS — E875 Hyperkalemia: Secondary | ICD-10-CM | POA: Diagnosis not present

## 2017-03-21 DIAGNOSIS — N2581 Secondary hyperparathyroidism of renal origin: Secondary | ICD-10-CM | POA: Diagnosis not present

## 2017-03-24 ENCOUNTER — Encounter (INDEPENDENT_AMBULATORY_CARE_PROVIDER_SITE_OTHER): Payer: Self-pay | Admitting: Physical Medicine and Rehabilitation

## 2017-03-24 ENCOUNTER — Inpatient Hospital Stay (HOSPITAL_COMMUNITY)
Admission: EM | Admit: 2017-03-24 | Discharge: 2017-03-29 | DRG: 252 | Disposition: A | Discharge status: Home / Self Care | Payer: Medicare Other | Attending: Internal Medicine | Admitting: Internal Medicine

## 2017-03-24 ENCOUNTER — Inpatient Hospital Stay (HOSPITAL_COMMUNITY): Payer: Medicare Other

## 2017-03-24 ENCOUNTER — Encounter (HOSPITAL_COMMUNITY): Payer: Self-pay | Admitting: Emergency Medicine

## 2017-03-24 DIAGNOSIS — L03119 Cellulitis of unspecified part of limb: Secondary | ICD-10-CM

## 2017-03-24 DIAGNOSIS — Z1401 Asymptomatic hemophilia A carrier: Secondary | ICD-10-CM

## 2017-03-24 DIAGNOSIS — I2510 Atherosclerotic heart disease of native coronary artery without angina pectoris: Secondary | ICD-10-CM | POA: Diagnosis present

## 2017-03-24 DIAGNOSIS — E1122 Type 2 diabetes mellitus with diabetic chronic kidney disease: Secondary | ICD-10-CM | POA: Diagnosis not present

## 2017-03-24 DIAGNOSIS — E1129 Type 2 diabetes mellitus with other diabetic kidney complication: Secondary | ICD-10-CM | POA: Diagnosis not present

## 2017-03-24 DIAGNOSIS — D696 Thrombocytopenia, unspecified: Secondary | ICD-10-CM | POA: Diagnosis present

## 2017-03-24 DIAGNOSIS — S92351D Displaced fracture of fifth metatarsal bone, right foot, subsequent encounter for fracture with routine healing: Secondary | ICD-10-CM

## 2017-03-24 DIAGNOSIS — E11622 Type 2 diabetes mellitus with other skin ulcer: Secondary | ICD-10-CM | POA: Diagnosis present

## 2017-03-24 DIAGNOSIS — E11628 Type 2 diabetes mellitus with other skin complications: Secondary | ICD-10-CM | POA: Diagnosis not present

## 2017-03-24 DIAGNOSIS — L97518 Non-pressure chronic ulcer of other part of right foot with other specified severity: Secondary | ICD-10-CM | POA: Diagnosis present

## 2017-03-24 DIAGNOSIS — N2581 Secondary hyperparathyroidism of renal origin: Secondary | ICD-10-CM | POA: Diagnosis present

## 2017-03-24 DIAGNOSIS — E11319 Type 2 diabetes mellitus with unspecified diabetic retinopathy without macular edema: Secondary | ICD-10-CM | POA: Diagnosis present

## 2017-03-24 DIAGNOSIS — R0989 Other specified symptoms and signs involving the circulatory and respiratory systems: Secondary | ICD-10-CM | POA: Diagnosis not present

## 2017-03-24 DIAGNOSIS — I97638 Postprocedural hematoma of a circulatory system organ or structure following other circulatory system procedure: Secondary | ICD-10-CM | POA: Diagnosis present

## 2017-03-24 DIAGNOSIS — E1151 Type 2 diabetes mellitus with diabetic peripheral angiopathy without gangrene: Secondary | ICD-10-CM

## 2017-03-24 DIAGNOSIS — L0390 Cellulitis, unspecified: Secondary | ICD-10-CM | POA: Diagnosis not present

## 2017-03-24 DIAGNOSIS — N189 Chronic kidney disease, unspecified: Secondary | ICD-10-CM

## 2017-03-24 DIAGNOSIS — I10 Essential (primary) hypertension: Secondary | ICD-10-CM | POA: Diagnosis not present

## 2017-03-24 DIAGNOSIS — S92311A Displaced fracture of first metatarsal bone, right foot, initial encounter for closed fracture: Secondary | ICD-10-CM | POA: Diagnosis present

## 2017-03-24 DIAGNOSIS — L97528 Non-pressure chronic ulcer of other part of left foot with other specified severity: Secondary | ICD-10-CM | POA: Diagnosis present

## 2017-03-24 DIAGNOSIS — G8929 Other chronic pain: Secondary | ICD-10-CM | POA: Diagnosis present

## 2017-03-24 DIAGNOSIS — M7989 Other specified soft tissue disorders: Secondary | ICD-10-CM | POA: Diagnosis not present

## 2017-03-24 DIAGNOSIS — IMO0002 Reserved for concepts with insufficient information to code with codable children: Secondary | ICD-10-CM | POA: Diagnosis present

## 2017-03-24 DIAGNOSIS — L98499 Non-pressure chronic ulcer of skin of other sites with unspecified severity: Secondary | ICD-10-CM

## 2017-03-24 DIAGNOSIS — E1169 Type 2 diabetes mellitus with other specified complication: Secondary | ICD-10-CM

## 2017-03-24 DIAGNOSIS — Z992 Dependence on renal dialysis: Secondary | ICD-10-CM | POA: Diagnosis not present

## 2017-03-24 DIAGNOSIS — L97909 Non-pressure chronic ulcer of unspecified part of unspecified lower leg with unspecified severity: Secondary | ICD-10-CM | POA: Diagnosis not present

## 2017-03-24 DIAGNOSIS — I2583 Coronary atherosclerosis due to lipid rich plaque: Secondary | ICD-10-CM | POA: Diagnosis not present

## 2017-03-24 DIAGNOSIS — S92351A Displaced fracture of fifth metatarsal bone, right foot, initial encounter for closed fracture: Secondary | ICD-10-CM | POA: Diagnosis not present

## 2017-03-24 DIAGNOSIS — L02619 Cutaneous abscess of unspecified foot: Secondary | ICD-10-CM

## 2017-03-24 DIAGNOSIS — Z6826 Body mass index (BMI) 26.0-26.9, adult: Secondary | ICD-10-CM

## 2017-03-24 DIAGNOSIS — L98491 Non-pressure chronic ulcer of skin of other sites limited to breakdown of skin: Secondary | ICD-10-CM | POA: Diagnosis not present

## 2017-03-24 DIAGNOSIS — R188 Other ascites: Secondary | ICD-10-CM | POA: Diagnosis present

## 2017-03-24 DIAGNOSIS — Z794 Long term (current) use of insulin: Secondary | ICD-10-CM | POA: Diagnosis not present

## 2017-03-24 DIAGNOSIS — I120 Hypertensive chronic kidney disease with stage 5 chronic kidney disease or end stage renal disease: Secondary | ICD-10-CM | POA: Diagnosis present

## 2017-03-24 DIAGNOSIS — N186 End stage renal disease: Secondary | ICD-10-CM | POA: Diagnosis present

## 2017-03-24 DIAGNOSIS — L03115 Cellulitis of right lower limb: Secondary | ICD-10-CM | POA: Diagnosis present

## 2017-03-24 DIAGNOSIS — Z8249 Family history of ischemic heart disease and other diseases of the circulatory system: Secondary | ICD-10-CM

## 2017-03-24 DIAGNOSIS — Z8349 Family history of other endocrine, nutritional and metabolic diseases: Secondary | ICD-10-CM

## 2017-03-24 DIAGNOSIS — E11621 Type 2 diabetes mellitus with foot ulcer: Secondary | ICD-10-CM | POA: Diagnosis present

## 2017-03-24 DIAGNOSIS — Y838 Other surgical procedures as the cause of abnormal reaction of the patient, or of later complication, without mention of misadventure at the time of the procedure: Secondary | ICD-10-CM | POA: Diagnosis present

## 2017-03-24 DIAGNOSIS — D631 Anemia in chronic kidney disease: Secondary | ICD-10-CM | POA: Diagnosis present

## 2017-03-24 DIAGNOSIS — S92321D Displaced fracture of second metatarsal bone, right foot, subsequent encounter for fracture with routine healing: Secondary | ICD-10-CM | POA: Diagnosis not present

## 2017-03-24 DIAGNOSIS — S92321A Displaced fracture of second metatarsal bone, right foot, initial encounter for closed fracture: Secondary | ICD-10-CM | POA: Diagnosis not present

## 2017-03-24 DIAGNOSIS — Z86001 Personal history of in-situ neoplasm of cervix uteri: Secondary | ICD-10-CM

## 2017-03-24 DIAGNOSIS — E440 Moderate protein-calorie malnutrition: Secondary | ICD-10-CM | POA: Diagnosis present

## 2017-03-24 DIAGNOSIS — I160 Hypertensive urgency: Secondary | ICD-10-CM | POA: Diagnosis present

## 2017-03-24 DIAGNOSIS — E1152 Type 2 diabetes mellitus with diabetic peripheral angiopathy with gangrene: Principal | ICD-10-CM | POA: Diagnosis present

## 2017-03-24 DIAGNOSIS — E875 Hyperkalemia: Secondary | ICD-10-CM | POA: Diagnosis present

## 2017-03-24 DIAGNOSIS — E1365 Other specified diabetes mellitus with hyperglycemia: Secondary | ICD-10-CM

## 2017-03-24 DIAGNOSIS — I96 Gangrene, not elsewhere classified: Secondary | ICD-10-CM | POA: Diagnosis not present

## 2017-03-24 DIAGNOSIS — E1322 Other specified diabetes mellitus with diabetic chronic kidney disease: Secondary | ICD-10-CM | POA: Diagnosis present

## 2017-03-24 DIAGNOSIS — Z833 Family history of diabetes mellitus: Secondary | ICD-10-CM

## 2017-03-24 DIAGNOSIS — Z881 Allergy status to other antibiotic agents status: Secondary | ICD-10-CM

## 2017-03-24 DIAGNOSIS — Z87891 Personal history of nicotine dependence: Secondary | ICD-10-CM

## 2017-03-24 LAB — HEMOGLOBIN A1C
HEMOGLOBIN A1C: 9.5 % — AB (ref 4.8–5.6)
Mean Plasma Glucose: 225.95 mg/dL

## 2017-03-24 LAB — CBC WITH DIFFERENTIAL/PLATELET
BASOS ABS: 0 10*3/uL (ref 0.0–0.1)
BASOS PCT: 0 %
BASOS PCT: 0 %
Basophils Absolute: 0 10*3/uL (ref 0.0–0.1)
EOS PCT: 1 %
EOS PCT: 1 %
Eosinophils Absolute: 0.1 10*3/uL (ref 0.0–0.7)
Eosinophils Absolute: 0.1 10*3/uL (ref 0.0–0.7)
HCT: 34.2 % — ABNORMAL LOW (ref 36.0–46.0)
HEMATOCRIT: 32.2 % — AB (ref 36.0–46.0)
HEMOGLOBIN: 10.5 g/dL — AB (ref 12.0–15.0)
Hemoglobin: 10.6 g/dL — ABNORMAL LOW (ref 12.0–15.0)
Lymphocytes Relative: 13 %
Lymphocytes Relative: 8 %
Lymphs Abs: 0.9 10*3/uL (ref 0.7–4.0)
Lymphs Abs: 1.4 10*3/uL (ref 0.7–4.0)
MCH: 28.4 pg (ref 26.0–34.0)
MCH: 30 pg (ref 26.0–34.0)
MCHC: 31 g/dL (ref 30.0–36.0)
MCHC: 32.6 g/dL (ref 30.0–36.0)
MCV: 91.7 fL (ref 78.0–100.0)
MCV: 92 fL (ref 78.0–100.0)
MONO ABS: 0.6 10*3/uL (ref 0.1–1.0)
MONO ABS: 0.7 10*3/uL (ref 0.1–1.0)
MONOS PCT: 6 %
Monocytes Relative: 6 %
Neutro Abs: 9.1 10*3/uL — ABNORMAL HIGH (ref 1.7–7.7)
Neutro Abs: 9.5 10*3/uL — ABNORMAL HIGH (ref 1.7–7.7)
Neutrophils Relative %: 80 %
Neutrophils Relative %: 85 %
PLATELETS: 121 10*3/uL — AB (ref 150–400)
Platelets: 109 10*3/uL — ABNORMAL LOW (ref 150–400)
RBC: 3.5 MIL/uL — ABNORMAL LOW (ref 3.87–5.11)
RBC: 3.73 MIL/uL — ABNORMAL LOW (ref 3.87–5.11)
RDW: 17.9 % — AB (ref 11.5–15.5)
RDW: 18.1 % — AB (ref 11.5–15.5)
WBC: 11.2 10*3/uL — ABNORMAL HIGH (ref 4.0–10.5)
WBC: 11.4 10*3/uL — ABNORMAL HIGH (ref 4.0–10.5)

## 2017-03-24 LAB — COMPREHENSIVE METABOLIC PANEL
ALT: 15 U/L (ref 14–54)
AST: 18 U/L (ref 15–41)
Albumin: 3.4 g/dL — ABNORMAL LOW (ref 3.5–5.0)
Alkaline Phosphatase: 236 U/L — ABNORMAL HIGH (ref 38–126)
Anion gap: 16 — ABNORMAL HIGH (ref 5–15)
BILIRUBIN TOTAL: 1 mg/dL (ref 0.3–1.2)
BUN: 58 mg/dL — ABNORMAL HIGH (ref 6–20)
CHLORIDE: 96 mmol/L — AB (ref 101–111)
CO2: 26 mmol/L (ref 22–32)
Calcium: 8.7 mg/dL — ABNORMAL LOW (ref 8.9–10.3)
Creatinine, Ser: 7.38 mg/dL — ABNORMAL HIGH (ref 0.44–1.00)
GFR, EST AFRICAN AMERICAN: 7 mL/min — AB (ref >60)
GFR, EST NON AFRICAN AMERICAN: 6 mL/min — AB (ref >60)
Glucose, Bld: 152 mg/dL — ABNORMAL HIGH (ref 65–99)
POTASSIUM: 4.8 mmol/L (ref 3.5–5.1)
Sodium: 138 mmol/L (ref 135–145)
TOTAL PROTEIN: 6.8 g/dL (ref 6.5–8.1)

## 2017-03-24 LAB — URIC ACID: URIC ACID, SERUM: 6.5 mg/dL (ref 2.3–6.6)

## 2017-03-24 LAB — BASIC METABOLIC PANEL
Anion gap: 17 — ABNORMAL HIGH (ref 5–15)
BUN: 60 mg/dL — AB (ref 6–20)
CALCIUM: 8.5 mg/dL — AB (ref 8.9–10.3)
CO2: 22 mmol/L (ref 22–32)
CREATININE: 7.49 mg/dL — AB (ref 0.44–1.00)
Chloride: 98 mmol/L — ABNORMAL LOW (ref 101–111)
GFR calc Af Amer: 7 mL/min — ABNORMAL LOW (ref >60)
GFR, EST NON AFRICAN AMERICAN: 6 mL/min — AB (ref >60)
GLUCOSE: 85 mg/dL (ref 65–99)
Potassium: 4.9 mmol/L (ref 3.5–5.1)
Sodium: 137 mmol/L (ref 135–145)

## 2017-03-24 LAB — HIV ANTIBODY (ROUTINE TESTING W REFLEX): HIV SCREEN 4TH GENERATION: NONREACTIVE

## 2017-03-24 LAB — GLUCOSE, CAPILLARY
GLUCOSE-CAPILLARY: 82 mg/dL (ref 65–99)
GLUCOSE-CAPILLARY: 162 mg/dL — AB (ref 65–99)
GLUCOSE-CAPILLARY: 252 mg/dL — AB (ref 65–99)
GLUCOSE-CAPILLARY: 222 mg/dL — AB (ref 65–99)

## 2017-03-24 LAB — SEDIMENTATION RATE: Sed Rate: 25 mm/hr — ABNORMAL HIGH (ref 0–22)

## 2017-03-24 LAB — C-REACTIVE PROTEIN: CRP: 1.8 mg/dL — ABNORMAL HIGH (ref <1.0)

## 2017-03-24 LAB — MRSA PCR SCREENING: MRSA BY PCR: NEGATIVE

## 2017-03-24 LAB — PREALBUMIN: PREALBUMIN: 19.7 mg/dL (ref 18–38)

## 2017-03-24 MED ORDER — AMLODIPINE BESYLATE 10 MG PO TABS
10.0000 mg | ORAL_TABLET | Freq: Every day | ORAL | Status: DC
  Administered 2017-03-24 – 2017-03-29 (×6): 10 mg via ORAL
  Filled 2017-03-24 (×6): qty 1

## 2017-03-24 MED ORDER — HYDRALAZINE HCL 50 MG PO TABS
100.0000 mg | ORAL_TABLET | Freq: Three times a day (TID) | ORAL | Status: DC
  Administered 2017-03-24 – 2017-03-29 (×14): 100 mg via ORAL
  Filled 2017-03-24 (×14): qty 2

## 2017-03-24 MED ORDER — OXYCODONE-ACETAMINOPHEN 5-325 MG PO TABS
ORAL_TABLET | ORAL | Status: AC
  Filled 2017-03-24: qty 1

## 2017-03-24 MED ORDER — CYCLOBENZAPRINE HCL 10 MG PO TABS
10.0000 mg | ORAL_TABLET | Freq: Every day | ORAL | Status: DC
  Administered 2017-03-25 – 2017-03-28 (×4): 10 mg via ORAL
  Filled 2017-03-24 (×5): qty 1

## 2017-03-24 MED ORDER — METRONIDAZOLE 500 MG PO TABS
500.0000 mg | ORAL_TABLET | Freq: Three times a day (TID) | ORAL | Status: DC
  Administered 2017-03-24 – 2017-03-25 (×3): 500 mg via ORAL
  Filled 2017-03-24 (×3): qty 1

## 2017-03-24 MED ORDER — SODIUM CHLORIDE 0.9 % IV SOLN
2000.0000 mg | Freq: Once | INTRAVENOUS | Status: AC
  Administered 2017-03-24: 2000 mg via INTRAVENOUS
  Filled 2017-03-24: qty 2000

## 2017-03-24 MED ORDER — KETOROLAC TROMETHAMINE 30 MG/ML IJ SOLN
15.0000 mg | Freq: Once | INTRAMUSCULAR | Status: AC
  Administered 2017-03-24: 15 mg via INTRAVENOUS
  Filled 2017-03-24: qty 1

## 2017-03-24 MED ORDER — FERRIC CITRATE 1 GM 210 MG(FE) PO TABS
630.0000 mg | ORAL_TABLET | Freq: Three times a day (TID) | ORAL | Status: DC
  Administered 2017-03-25 – 2017-03-29 (×8): 630 mg via ORAL
  Filled 2017-03-24 (×16): qty 3

## 2017-03-24 MED ORDER — INSULIN DETEMIR 100 UNIT/ML ~~LOC~~ SOLN
20.0000 [IU] | Freq: Two times a day (BID) | SUBCUTANEOUS | Status: DC
  Administered 2017-03-24: 20 [IU] via SUBCUTANEOUS
  Filled 2017-03-24: qty 0.2

## 2017-03-24 MED ORDER — METRONIDAZOLE IN NACL 5-0.79 MG/ML-% IV SOLN
500.0000 mg | Freq: Three times a day (TID) | INTRAVENOUS | Status: DC
  Administered 2017-03-24 (×2): 500 mg via INTRAVENOUS
  Filled 2017-03-24 (×3): qty 100

## 2017-03-24 MED ORDER — ACETAMINOPHEN 650 MG RE SUPP: 650.0000 mg | Freq: Four times a day (QID) | RECTAL | Status: DC | PRN

## 2017-03-24 MED ORDER — ACETAMINOPHEN 325 MG PO TABS: 650.0000 mg | ORAL_TABLET | Freq: Four times a day (QID) | ORAL | Status: DC | PRN

## 2017-03-24 MED ORDER — PRO-STAT SUGAR FREE PO LIQD
30.0000 mL | Freq: Two times a day (BID) | ORAL | Status: DC
  Administered 2017-03-26: 30 mL via ORAL
  Filled 2017-03-24 (×6): qty 30

## 2017-03-24 MED ORDER — HYDROCODONE-ACETAMINOPHEN 5-325 MG PO TABS
1.0000 | ORAL_TABLET | Freq: Four times a day (QID) | ORAL | Status: DC | PRN
  Administered 2017-03-24 – 2017-03-29 (×8): 2 via ORAL
  Filled 2017-03-24 (×8): qty 2

## 2017-03-24 MED ORDER — ATORVASTATIN CALCIUM 40 MG PO TABS
40.0000 mg | ORAL_TABLET | Freq: Every day | ORAL | Status: DC
  Administered 2017-03-24 – 2017-03-28 (×5): 40 mg via ORAL
  Filled 2017-03-24 (×5): qty 1

## 2017-03-24 MED ORDER — DOXERCALCIFEROL 4 MCG/2ML IV SOLN
7.0000 ug | INTRAVENOUS | Status: DC
  Administered 2017-03-26 – 2017-03-28 (×2): 7 ug via INTRAVENOUS
  Filled 2017-03-24 (×3): qty 4

## 2017-03-24 MED ORDER — CIPROFLOXACIN IN D5W 400 MG/200ML IV SOLN
400.0000 mg | Freq: Once | INTRAVENOUS | Status: DC
  Filled 2017-03-24: qty 200

## 2017-03-24 MED ORDER — VANCOMYCIN HCL IN DEXTROSE 1-5 GM/200ML-% IV SOLN
1000.0000 mg | Freq: Once | INTRAVENOUS | Status: DC
  Filled 2017-03-24: qty 200

## 2017-03-24 MED ORDER — SENNOSIDES-DOCUSATE SODIUM 8.6-50 MG PO TABS
1.0000 | ORAL_TABLET | Freq: Every evening | ORAL | Status: DC | PRN
  Filled 2017-03-24: qty 1

## 2017-03-24 MED ORDER — DEXTROSE 5 % IV SOLN
500.0000 mg | Freq: Two times a day (BID) | INTRAVENOUS | Status: DC
  Administered 2017-03-24: 500 mg via INTRAVENOUS
  Filled 2017-03-24: qty 0.5

## 2017-03-24 MED ORDER — SODIUM CHLORIDE 0.9% FLUSH: 3.0000 mL | INTRAVENOUS | Status: DC | PRN

## 2017-03-24 MED ORDER — DEXTROSE 5 % IV SOLN
2.0000 g | INTRAVENOUS | Status: DC
  Administered 2017-03-24 – 2017-03-25 (×2): 2 g via INTRAVENOUS
  Filled 2017-03-24 (×2): qty 2

## 2017-03-24 MED ORDER — HYDROCODONE-ACETAMINOPHEN 5-325 MG PO TABS: 1.0000 | ORAL_TABLET | Freq: Four times a day (QID) | ORAL | Status: DC | PRN

## 2017-03-24 MED ORDER — INSULIN ASPART 100 UNIT/ML ~~LOC~~ SOLN
0.0000 [IU] | Freq: Three times a day (TID) | SUBCUTANEOUS | Status: DC
  Administered 2017-03-24: 2 [IU] via SUBCUTANEOUS
  Administered 2017-03-24 – 2017-03-25 (×2): 5 [IU] via SUBCUTANEOUS
  Administered 2017-03-25: 2 [IU] via SUBCUTANEOUS
  Administered 2017-03-27 – 2017-03-28 (×3): 3 [IU] via SUBCUTANEOUS
  Administered 2017-03-29: 1 [IU] via SUBCUTANEOUS

## 2017-03-24 MED ORDER — CLINDAMYCIN PHOSPHATE 600 MG/50ML IV SOLN
600.0000 mg | Freq: Once | INTRAVENOUS | Status: DC
  Filled 2017-03-24: qty 50

## 2017-03-24 MED ORDER — CINACALCET HCL 30 MG PO TABS
60.0000 mg | ORAL_TABLET | Freq: Every day | ORAL | Status: DC
  Administered 2017-03-25 – 2017-03-28 (×4): 60 mg via ORAL
  Filled 2017-03-24 (×5): qty 2

## 2017-03-24 MED ORDER — VANCOMYCIN HCL IN DEXTROSE 1-5 GM/200ML-% IV SOLN
1000.0000 mg | INTRAVENOUS | Status: DC
  Filled 2017-03-24: qty 200

## 2017-03-24 MED ORDER — LABETALOL HCL 300 MG PO TABS
300.0000 mg | ORAL_TABLET | Freq: Three times a day (TID) | ORAL | Status: DC
  Administered 2017-03-24 – 2017-03-29 (×15): 300 mg via ORAL
  Filled 2017-03-24: qty 1
  Filled 2017-03-24: qty 2
  Filled 2017-03-24 (×2): qty 1
  Filled 2017-03-24: qty 2
  Filled 2017-03-24 (×2): qty 1
  Filled 2017-03-24: qty 2
  Filled 2017-03-24 (×4): qty 1
  Filled 2017-03-24: qty 2
  Filled 2017-03-24 (×3): qty 1

## 2017-03-24 MED ORDER — CLONIDINE HCL 0.3 MG PO TABS
0.3000 mg | ORAL_TABLET | Freq: Three times a day (TID) | ORAL | Status: DC
  Administered 2017-03-24 – 2017-03-29 (×16): 0.3 mg via ORAL
  Filled 2017-03-24 (×17): qty 1

## 2017-03-24 MED ORDER — PENTOXIFYLLINE ER 400 MG PO TBCR
400.0000 mg | EXTENDED_RELEASE_TABLET | Freq: Three times a day (TID) | ORAL | Status: DC
  Administered 2017-03-25 – 2017-03-29 (×9): 400 mg via ORAL
  Filled 2017-03-24 (×15): qty 1

## 2017-03-24 MED ORDER — FENTANYL CITRATE (PF) 100 MCG/2ML IJ SOLN
50.0000 ug | Freq: Once | INTRAMUSCULAR | Status: AC
  Administered 2017-03-24: 50 ug via INTRAVENOUS
  Filled 2017-03-24: qty 2

## 2017-03-24 MED ORDER — RENA-VITE PO TABS
1.0000 | ORAL_TABLET | Freq: Every day | ORAL | Status: DC
  Administered 2017-03-24 – 2017-03-28 (×5): 1 via ORAL
  Filled 2017-03-24 (×5): qty 1

## 2017-03-24 MED ORDER — SODIUM CHLORIDE 0.9% FLUSH
3.0000 mL | Freq: Two times a day (BID) | INTRAVENOUS | Status: DC
  Administered 2017-03-24 – 2017-03-27 (×4): 3 mL via INTRAVENOUS

## 2017-03-24 MED ORDER — DARBEPOETIN ALFA 200 MCG/0.4ML IJ SOSY
200.0000 ug | PREFILLED_SYRINGE | INTRAMUSCULAR | Status: DC
  Administered 2017-03-26: 200 ug via INTRAVENOUS
  Filled 2017-03-24: qty 0.4

## 2017-03-24 MED ORDER — ONDANSETRON HCL 4 MG/2ML IJ SOLN
4.0000 mg | Freq: Four times a day (QID) | INTRAMUSCULAR | Status: DC | PRN
  Administered 2017-03-24 – 2017-03-29 (×7): 4 mg via INTRAVENOUS
  Filled 2017-03-24 (×9): qty 2

## 2017-03-24 MED ORDER — SODIUM CHLORIDE 0.9 % IV SOLN
250.0000 mL | INTRAVENOUS | Status: DC | PRN
  Administered 2017-03-24: 250 mL via INTRAVENOUS

## 2017-03-24 MED ORDER — HEPARIN SODIUM (PORCINE) 5000 UNIT/ML IJ SOLN
5000.0000 [IU] | Freq: Three times a day (TID) | INTRAMUSCULAR | Status: DC
Start: 2017-03-24 — End: 2017-03-29
  Administered 2017-03-24 – 2017-03-29 (×10): 5000 [IU] via SUBCUTANEOUS
  Filled 2017-03-24 (×13): qty 1

## 2017-03-24 MED ORDER — INSULIN ASPART 100 UNIT/ML ~~LOC~~ SOLN
0.0000 [IU] | Freq: Every day | SUBCUTANEOUS | Status: DC
  Administered 2017-03-24: 2 [IU] via SUBCUTANEOUS
  Administered 2017-03-27: 4 [IU] via SUBCUTANEOUS

## 2017-03-24 MED ORDER — FENTANYL CITRATE (PF) 100 MCG/2ML IJ SOLN: 50.0000 ug | INTRAMUSCULAR | Status: DC | PRN

## 2017-03-24 MED ORDER — SEVELAMER CARBONATE 800 MG PO TABS
4800.0000 mg | ORAL_TABLET | Freq: Three times a day (TID) | ORAL | Status: DC
  Administered 2017-03-24 – 2017-03-29 (×10): 4800 mg via ORAL
  Filled 2017-03-24 (×12): qty 6

## 2017-03-24 MED ORDER — TRAMADOL HCL 50 MG PO TABS
50.0000 mg | ORAL_TABLET | Freq: Four times a day (QID) | ORAL | Status: DC | PRN
  Filled 2017-03-24 (×2): qty 1

## 2017-03-24 MED ORDER — ISOSORBIDE MONONITRATE ER 60 MG PO TB24
120.0000 mg | ORAL_TABLET | Freq: Every day | ORAL | Status: DC
  Administered 2017-03-24 – 2017-03-29 (×6): 120 mg via ORAL
  Filled 2017-03-24: qty 2
  Filled 2017-03-24: qty 4
  Filled 2017-03-24 (×2): qty 2
  Filled 2017-03-24: qty 4
  Filled 2017-03-24: qty 2

## 2017-03-24 MED ORDER — NEPRO/CARBSTEADY PO LIQD
237.0000 mL | Freq: Two times a day (BID) | ORAL | Status: DC
  Administered 2017-03-26: 237 mL via ORAL
  Filled 2017-03-24 (×12): qty 237

## 2017-03-24 MED ORDER — SODIUM CHLORIDE 0.9 % IV BOLUS (SEPSIS): 500.0000 mL | Freq: Once | INTRAVENOUS | Status: DC

## 2017-03-24 MED ORDER — OXYCODONE-ACETAMINOPHEN 5-325 MG PO TABS
1.0000 | ORAL_TABLET | Freq: Once | ORAL | Status: AC
  Administered 2017-03-24: 1 via ORAL

## 2017-03-24 MED ORDER — INSULIN DETEMIR 100 UNIT/ML ~~LOC~~ SOLN
20.0000 [IU] | Freq: Every day | SUBCUTANEOUS | Status: DC
  Administered 2017-03-25 – 2017-03-29 (×4): 20 [IU] via SUBCUTANEOUS
  Filled 2017-03-24 (×5): qty 0.2

## 2017-03-24 MED ORDER — NITROGLYCERIN 0.2 MG/HR TD PT24
0.2000 mg | MEDICATED_PATCH | Freq: Every day | TRANSDERMAL | Status: DC
  Administered 2017-03-24 – 2017-03-26 (×3): 0.2 mg via TRANSDERMAL
  Filled 2017-03-24 (×6): qty 1

## 2017-03-24 MED ORDER — ONDANSETRON HCL 4 MG PO TABS
4.0000 mg | ORAL_TABLET | Freq: Four times a day (QID) | ORAL | Status: DC | PRN
  Administered 2017-03-28: 4 mg via ORAL
  Filled 2017-03-24: qty 1

## 2017-03-24 MED ORDER — BISACODYL 5 MG PO TBEC: 5.0000 mg | DELAYED_RELEASE_TABLET | Freq: Every day | ORAL | Status: DC | PRN

## 2017-03-24 NOTE — Progress Notes (Addendum)
The patient was seen and examined at bedside. She was admitted today morning. Please see H&P by Dr.Opyd for detail.  41 year old female with history of hypertension, insulin-dependent diabetes, coronary artery disease, ESRD on hemodialysis Monday Wednesday Friday presented with worsening right lower leg/foot pain and swelling and redness. Patient reported nonhealing ulcers involving the toes of bilateral foot. She was restarted on IV antibiotics. X-ray with multiple acute and old  Fractures. She has multiple wounds and scabs in bilateral foot and toes.  Plan: -Orthopedics consult requested -Follow up culture results, for now continue broad spectrum antibiotics. ID consulted, spoke with Dr. Baxter Flattery -Follow-up vascular study ABI -Nephrology following for the management of ESRD -Reduced the dose of levemir to 20 units.  Continue current medical and supportive care. DVT prophylaxis with heparin subcutaneous

## 2017-03-24 NOTE — ED Notes (Signed)
Second set of blood cultures drawn and sent

## 2017-03-24 NOTE — Consult Note (Signed)
[facility] for Infectious Disease    Date of Admission:  03/24/2017     Total days of antibiotics  1  Aztreonam 10/29  Metronidazole 10/29  Vancomycin 10/29              Reason for Consult: non-healing ulcers in diabetic/ESRD patient     Referring Provider: Dr. Carolin Sicks  Primary Care Provider: Merrilee Seashore, MD    Assessment: [name] is a 41 yo female with non-healing ulcerations to her RLE and cellulitis.  - BCx 10/29 >> pending  Plan: 1. Agree with MRI to rule out osteomyelitis.  2. Agree with adding Flagyl to regimen for polymicrobial coverage in known diabetic patient. Change to PO to help with fluid management. Tolerating diet well.  3. Tolerated Ceftriaxone in 2016 and 2017 - will change aztreonam to Ceftriaxone and continue Vancomycin for now.  4. ABI with non-compressible vessels and falsely elevated ABI. Right and left toe ABIs normal - would recommend follow up with vascular for possible necessary vascular interventions.  5. Wound care per [facility] RN recommendations for avulsion injury to right toe.  6. Pending ortho eval for any necessary debridement.     [name], MSN, NP-C [facility] Pager: [phone]  03/24/2017 1:32 PM    Principal Problem:   Cellulitis in diabetic foot ([place]) Active Problems:   End stage renal disease on dialysis [facility])   Coronary artery disease   DM (diabetes mellitus), type 2 with renal complications ([place])   Hypertension   Hypertensive urgency   Cellulitis   Chronic pain   Diabetic ulcer of lower leg ([place])   . amLODipine  10 mg Oral Daily  . atorvastatin  40 mg Oral q1800  . cinacalcet  60 mg Oral Q supper  . cloNIDine  0.3 mg Oral TID  . cyclobenzaprine  10 mg Oral QHS  . [START ON 03/26/2017] darbepoetin (ARANESP) injection - DIALYSIS  200 mcg Intravenous Q Wed-HD  . doxercalciferol  7 mcg Intravenous Q M,W,F-HD  . feeding supplement  (NEPRO CARB STEADY)  237 mL Oral BID BM  . feeding supplement (PRO-STAT SUGAR FREE 64)  30 mL Oral BID  . ferric citrate  630 mg Oral TID WC  . heparin  5,000 Units Subcutaneous Q8H  . hydrALAZINE  100 mg Oral Q8H  . insulin aspart  0-5 Units Subcutaneous QHS  . insulin aspart  0-9 Units Subcutaneous TID WC  . [START ON 03/25/2017] insulin detemir  20 Units Subcutaneous Daily  . isosorbide mononitrate  120 mg Oral Daily  . labetalol  300 mg Oral TID  . sevelamer carbonate  4,800 mg Oral TID WC  . sodium chloride flush  3 mL Intravenous Q12H    HPI: [name] is a 41 y.o. female that presented to [facility] with right lower leg swelling, redness, pain and discharge.   PMHx significant for ESRD, DM with long-term insulin use, htn, cad, chronic anemia.   Hospital Course: afebrile with WBC 11.2k in the ED; hypertensive with BP 198/90. Received Ciprofloxacin, Clindamycin and Vancomycin. Now maintained on aztreonam (Keflex allergy), Vancomycin WOC team and Ortho team consulted - XRays showed 1st proximal phalanx fracture and subacute fracture of 2nd metatarsal. Orthopedic team ordered MRI to assess for osteo of digits and hard soled shoe for tx.   She tells me that she has had significant improvement in her pain since starting on IV antibiotics. Originally  had streaking up her right ankle/calf but is now gone. No fevers or chills since coming to hospital. Tolerating abx well currently. Uncertain as to previous reactions in the past with cephalexin or bactrim as the reactions happened as a child. Reports good sensation in her feet. Denies any recent animal bite/puncture wound; dog stepped on her right foot and caused current skin tear of right #3 digit. No profuse bleeding after this incident. No drainage/pus from sites.   Review of Systems: Review of Systems  Constitutional: Negative for chills and fever.  Respiratory: Negative.   Cardiovascular: Negative.   Gastrointestinal: Negative for  diarrhea, nausea and vomiting.  Musculoskeletal: Negative.   Skin: Negative for rash.    Past Medical History:  Diagnosis Date  . Chronic anemia    2nd to renal disease  . CIN III (cervical intraepithelial neoplasia grade III) with severe dysplasia    S/P LEEP AND CONE  . Coronary artery disease    Status post cardiac catheterization June 2012 scattered coronary artery disease/atherosclerosis with 70-80% stenosis in a small right PDA.  . Diabetic Charcot's joint disease ([place])   . DM (diabetes mellitus) ([place])    Long-term insulin  . End stage renal disease on dialysis ([place]) 05/02/11   [facility]; M; W, F; last time 05/01/11  . Fracture of 5th metatarsal 2016   Right  . Gastroparesis   . 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 History of abscesses in groins 12/06/2010  . Hyperlipidemia    Hypertriglyceridemia 449 HDL 25  . Hypertension   . Migraines    "just on dialysis days"  . Peripheral neuropathy    related to DM  . Peripheral vascular disease ([place])    Tibial occlusive disease evaluated by Dr. Kellie Simmering in August 2011. Medical therapy  . Renal insufficiency    Dialysis since 2012  . Tobacco use disorder    Discontinued March 2012  . Trimalleolar fracture of ankle, closed 02/09/2015   Right    Social History  Substance Use Topics  . Smoking status: Former Smoker    Packs/day: 0.30    Years: 10.00    Types: Cigarettes    Quit date: 08/05/2011  . Smokeless tobacco: Never Used     Comment: "quit smoking cigarettes 07/2010"  . Alcohol use No    Family History  Problem Relation Age of Onset  . Diabetes Mother   . Hypertension Mother   . Diabetes Father   . Hyperlipidemia Father   . Hypertension Father   . Diabetes Sister   . Stroke Maternal Grandmother   . Diabetes Sister   . Breast cancer Maternal Aunt        Age 77's   Allergies  Allergen Reactions  . Cephalexin Other (See Comments)    Reaction unknown  . Sulfamethoxazole-Trimethoprim Other (See Comments)    Unknown  reaction. Pt states that she was told by her mother that she had allergy to Bactrim as a child.    OBJECTIVE: Blood pressure (!) 164/73, pulse 62, temperature 98.3 F (36.8 C), temperature source Oral, resp. rate 19, height 6\' 4"  (1.93 m), weight 206 lb (93.4 kg), last menstrual period 10/09/2015, SpO2 97 %.  Physical Exam  Constitutional: She is oriented to person, place, and time and well-developed, well-nourished, and in no distress.  HENT:  Mouth/Throat: No oral lesions. No dental abscesses.  Eyes: No scleral icterus.  Cardiovascular: Normal rate and regular rhythm.   Murmur (Systolic ) heard. Pulmonary/Chest: Effort normal and breath sounds  normal. No respiratory distress.  Abdominal: Soft. She exhibits no distension. There is no tenderness.  Musculoskeletal: Normal range of motion. She exhibits no edema or tenderness.  Ulceration with dry eschar to 5th toe on right foot. Ulcer with yellow/white 1st medial metatarsal. Skin is removed from 3rd toe without drainage, 100% red wound bed.   Ulcerations also noted to left toe #3 and #5 with dry eschar.   Adequate sensation to sharp touch bilaterally.   Lymphadenopathy:    She has no cervical adenopathy.  Neurological: She is alert and oriented to person, place, and time.  Skin: Skin is warm and dry. No rash noted.  Psychiatric: Mood, affect and judgment normal.         Lab Results Lab Results  Component Value Date   WBC 11.4 (H) 03/24/2017   HGB 10.5 (L) 03/24/2017   HCT 32.2 (L) 03/24/2017   MCV 92.0 03/24/2017   PLT 109 (L) 03/24/2017    Lab Results  Component Value Date   CREATININE 7.49 (H) 03/24/2017   BUN 60 (H) 03/24/2017   NA 137 03/24/2017   K 4.9 03/24/2017   CL 98 (L) 03/24/2017   CO2 22 03/24/2017    Lab Results  Component Value Date   ALT 15 03/24/2017   AST 18 03/24/2017   ALKPHOS 236 (H) 03/24/2017   BILITOT 1.0 03/24/2017     Microbiology: Recent Results (from the past 240 hour(s))  Blood  Cultures x 2 sites     Status: None (Preliminary result)   Collection Time: 03/24/17  4:20 AM  Result Value Ref Range Status   Specimen Description BLOOD RIGHT HAND  Final   Special Requests   Final    BOTTLES DRAWN AEROBIC AND ANAEROBIC Blood Culture adequate volume   Culture PENDING  Incomplete   Report Status PENDING  Incomplete  Blood Cultures x 2 sites     Status: None (Preliminary result)   Collection Time: 03/24/17  4:40 AM  Result Value Ref Range Status   Specimen Description BLOOD RIGHT FOREARM  Final   Special Requests   Final    BOTTLES DRAWN AEROBIC AND ANAEROBIC Blood Culture adequate volume   Culture PENDING  Incomplete   Report Status PENDING  Incomplete  MRSA PCR Screening     Status: None   Collection Time: 03/24/17  6:51 AM  Result Value Ref Range Status   MRSA by PCR NEGATIVE NEGATIVE Final    Comment:        The GeneXpert MRSA Assay (FDA approved for NASAL specimens only), is one component of a comprehensive MRSA colonization surveillance program. It is not intended to diagnose MRSA infection nor to guide or monitor treatment for MRSA infections.    IMAGING:  XRay Left and Right Foot:  IMPRESSION RIGHT foot: 1. Acute to subacute proximal first phalanx fractures. Age indeterminate second distal metatarsus fracture.Multiple old fractures. 2. Bandage over third toe, no radiographic findings of osteomyelitis though MRI is more sensitive.  LEFT foot: 1. No acute fracture deformity or dislocation. Multiple old fractures. 2. No radiographic findings of osteomyelitis though, MRI is more sensitive.

## 2017-03-24 NOTE — Consult Note (Signed)
ORTHOPAEDIC CONSULTATION  REQUESTING PHYSICIAN: Rosita Fire, MD  Chief Complaint: Painful gangrene right forefoot  HPI: [name] is a 41 y.o. female who presents with multiple medical problems including diabetes peripheral vascular disease end-stage renal disease on dialysis with a chronic gangrenous ulcer to the right little toe with new acute ischemic ulcer to the right third toe.  Past Medical History:  Diagnosis Date  . Chronic anemia    2nd to renal disease  . CIN III (cervical intraepithelial neoplasia grade III) with severe dysplasia    S/P LEEP AND CONE  . Coronary artery disease    Status post cardiac catheterization June 2012 scattered coronary artery disease/atherosclerosis with 70-80% stenosis in a small right PDA.  . Diabetic Charcot's joint disease ([place])   . DM (diabetes mellitus) ([place])    Long-term insulin  . End stage renal disease on dialysis ([place]) 05/02/11   [facility]; M; W, F; last time 05/01/11  . Fracture of 5th metatarsal 2016   Right  . Gastroparesis   . 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 History of abscesses in groins 12/06/2010  . Hyperlipidemia    Hypertriglyceridemia 449 HDL 25  . Hypertension   . Migraines    "just on dialysis days"  . Peripheral neuropathy    related to DM  . Peripheral vascular disease ([place])    Tibial occlusive disease evaluated by Dr. Kellie Simmering in August 2011. Medical therapy  . Renal insufficiency    Dialysis since 2012  . Tobacco use disorder    Discontinued March 2012  . Trimalleolar fracture of ankle, closed 02/09/2015   Right   Past Surgical History:  Procedure Laterality Date  . AV FISTULA PLACEMENT  04/2010  . CARDIAC CATHETERIZATION [name] 02/10/2015   Procedure: Left Heart Cath and Coronary Angiography;  Surgeon: Dixie Dials, MD;  Location: [facility];  Service: Cardiovascular;  Laterality: [name];  . CARDIAC CATHETERIZATION [name] 02/21/2016   Procedure: Right Heart Cath;  Surgeon: Jolaine Artist, MD;   Location: [place] [facility];  Service: Cardiovascular;  Laterality: [name];  . CERVICAL BIOPSY  W/ LOOP ELECTRODE EXCISION     h/o  . CERVICAL CONE BIOPSY     h/o  . COLPOSCOPY    . DILATION AND CURETTAGE OF UTERUS  2009  . ORIF ANKLE FRACTURE Right 02/09/2015  . ORIF ANKLE FRACTURE Right 02/16/2015   Procedure: OPEN REDUCTION INTERNAL FIXATION (ORIF) RIGHT ANKLE FRACTURE;  Surgeon: Altamese Westmoreland, MD;  Location: [facility];  Service: Orthopedics;  Laterality: Right;   Social History   Social History  . Marital status: Married    Spouse name: [name]  . Number of children: [name]  . Years of education: [name]   Occupational History  .  [employer]   Social History Main Topics  . Smoking status: Former Smoker    Packs/day: 0.30    Years: 10.00    Types: Cigarettes    Quit date: 08/05/2011  . Smokeless tobacco: Never Used     Comment: "quit smoking cigarettes 07/2010"  . Alcohol use No  . Drug use: No  . Sexual activity: Not Asked   Other Topics Concern  . None   Social History Narrative  . None   Family History  Problem Relation Age of Onset  . Diabetes Mother   . Hypertension Mother   . Diabetes Father   . Hyperlipidemia Father   . Hypertension Father   . Diabetes Sister   . Stroke  Maternal Grandmother   . Diabetes Sister   . Breast cancer Maternal Aunt        Age 20's   - negative except otherwise stated in the family history section Allergies  Allergen Reactions  . Cephalexin Other (See Comments)    Reaction unknown- Childhood allergy Tolerated Ceftriaxone in the past  . Sulfamethoxazole-Trimethoprim Other (See Comments)    Unknown reaction. Pt states that she was told by her mother that she had allergy to Bactrim as a child.   Prior to Admission medications   Medication Sig Start Date End Date Taking? Authorizing Provider  amLODipine (NORVASC) 10 MG tablet Take 1 tablet (10 mg total) by mouth daily. 02/24/16  Yes Tat, Shanon Brow, MD  amoxicillin-clavulanate (AUGMENTIN) 500-125 MG  tablet Take 1 tablet (500 mg total) by mouth daily. X 7 days, then on Mon-Wed-Fri only after dialysis 02/24/16  Yes Tat, David, MD  atorvastatin (LIPITOR) 40 MG tablet Take 40 mg by mouth daily.   Yes [provider]  cloNIDine (CATAPRES) 0.3 MG tablet Take 1 tablet (0.3 mg total) by mouth 3 (three) times daily. 11/01/14  Yes Debbe Odea, MD  cyclobenzaprine (FLEXERIL) 10 MG tablet Take 1 tablet (10 mg total) by mouth at bedtime. Patient taking differently: Take 10 mg by mouth at bedtime as needed for muscle spasms.  03/12/17  Yes Magnus Sinning, MD  Ferric Citrate (AURYXIA PO) Take 2 tablets by mouth 3 (three) times daily with meals.   Yes [provider]  HUMALOG MIX 50/50 KWIKPEN (50-50) 100 UNIT/ML Kwikpen Inject 26 Units into the skin 2 (two) times daily.  04/29/14  Yes [provider]  hydrALAZINE (APRESOLINE) 100 MG tablet Take 1 tablet (100 mg total) by mouth every 8 (eight) hours. 02/24/16  Yes Tat, Shanon Brow, MD  HYDROcodone-acetaminophen (NORCO) 5-325 MG tablet Take 1 tablet by mouth every 6 (six) hours as needed for moderate pain. 02/02/17  Yes Kirichenko, Tatyana, PA-C  insulin lispro (HUMALOG) 100 UNIT/ML injection Inject 6 Units into the skin 3 (three) times daily before meals.   Yes [provider]  isosorbide mononitrate (IMDUR) 120 MG 24 hr tablet Take 1 tablet (120 mg total) by mouth daily. 02/25/16  Yes Tat, Shanon Brow, MD  labetalol (NORMODYNE) 300 MG tablet Take 1 tablet (300 mg total) by mouth 3 (three) times daily. 11/01/14  Yes Debbe Odea, MD  RENVELA 800 MG tablet Take 4,800 mg by mouth 3 (three) times daily with meals.  04/17/13  Yes [provider]  traMADol (ULTRAM) 50 MG tablet Take 1 tablet (50 mg total) by mouth every 6 (six) hours as needed for moderate pain (or Headache unrelieved by tylenol). 02/24/16  Deveron Furlong, MD   Dg Foot 2 Views Left  Result Date: 03/24/2017 CLINICAL DATA:  Nonhealing toe ulcers. RIGHT leg swelling.  History of diabetes, Charcot joint. EXAM: LEFT FOOT - 2 VIEW; RIGHT FOOT - 2 VIEW COMPARISON:  RIGHT foot radiograph February 13, 2015 and RIGHT ankle radiograph April 21, 2016. LEFT ankle radiograph February 02, 2017. FINDINGS: RIGHT foot: Acute to subacute corner fractures first proximal phalanx with intra-articular extension. No dislocation. Age indeterminate distal second metatarsal fracture. Unchanged intraosseous cyst first proximal phalanx. Old healed fifth metatarsus fracture. Osteopenia. Soft tissue swellings. Small plantar calcaneal spur. Severe vascular calcifications. Bandage overlying third digit with soft tissue swelling. No subcutaneous gas or radiopaque foreign bodies. Ankle hardware. LEFT foot: No acute fracture deformity or dislocation. Old second and third proximal phalanx fractures. Old fifth metatarsus  fracture. Osteopenia. Potential cyst first proximal phalanx. Small plantar calcaneal spur. Chronic offset of tarsal metatarsal joint space on lateral radiograph. Severe vascular calcifications. No subcutaneous gas or radiopaque foreign bodies. IMPRESSION: RIGHT foot: 1. Acute to subacute proximal first phalanx fractures. Age indeterminate second distal metatarsus fracture.Multiple old fractures. 2. Bandage over third toe, no radiographic findings of osteomyelitis though MRI is more sensitive. LEFT foot: 1. No acute fracture deformity or dislocation. Multiple old fractures. 2. No radiographic findings of osteomyelitis though, MRI is more sensitive. Electronically Signed   By: Elon Alas M.D.   On: 03/24/2017 05:27   Dg Foot 2 Views Right  Result Date: 03/24/2017 CLINICAL DATA:  Nonhealing toe ulcers. RIGHT leg swelling. History of diabetes, Charcot joint. EXAM: LEFT FOOT - 2 VIEW; RIGHT FOOT - 2 VIEW COMPARISON:  RIGHT foot radiograph February 13, 2015 and RIGHT ankle radiograph April 21, 2016. LEFT ankle radiograph February 02, 2017. FINDINGS: RIGHT foot: Acute to subacute  corner fractures first proximal phalanx with intra-articular extension. No dislocation. Age indeterminate distal second metatarsal fracture. Unchanged intraosseous cyst first proximal phalanx. Old healed fifth metatarsus fracture. Osteopenia. Soft tissue swellings. Small plantar calcaneal spur. Severe vascular calcifications. Bandage overlying third digit with soft tissue swelling. No subcutaneous gas or radiopaque foreign bodies. Ankle hardware. LEFT foot: No acute fracture deformity or dislocation. Old second and third proximal phalanx fractures. Old fifth metatarsus fracture. Osteopenia. Potential cyst first proximal phalanx. Small plantar calcaneal spur. Chronic offset of tarsal metatarsal joint space on lateral radiograph. Severe vascular calcifications. No subcutaneous gas or radiopaque foreign bodies. IMPRESSION: RIGHT foot: 1. Acute to subacute proximal first phalanx fractures. Age indeterminate second distal metatarsus fracture.Multiple old fractures. 2. Bandage over third toe, no radiographic findings of osteomyelitis though MRI is more sensitive. LEFT foot: 1. No acute fracture deformity or dislocation. Multiple old fractures. 2. No radiographic findings of osteomyelitis though, MRI is more sensitive. Electronically Signed   By: Elon Alas M.D.   On: 03/24/2017 05:27   - pertinent xrays, CT, MRI studies were reviewed and independently interpreted  Positive ROS: All other systems have been reviewed and were otherwise negative with the exception of those mentioned in the HPI and as above.  Physical Exam: General: Alert, no acute distress Psychiatric: Patient is competent for consent with normal mood and affect Lymphatic: No axillary or cervical lymphadenopathy Cardiovascular: No pedal edema Respiratory: No cyanosis, no use of accessory musculature GI: No organomegaly, abdomen is soft and non-tender  Skin: Examination patient has blistering and ischemic changes to the third toe.  There  is a dry gangrenous ulcer to the fifth toe.   Neurologic: Patient does not have protective sensation bilateral lower extremities.   MUSCULOSKELETAL:  Examination patient does not have a palpable dorsalis pedis pulse.  Review of the ankle brachial indices shows noncompressible vessels.  Patient has no ascending cellulitis she has thin atrophic skin in her foot.  There is no hindfoot ulcers.  Assessment: Assessment: Diabetic insensate neuropathy peripheral vascular disease with calcified vessels to the foot with end-stage renal disease on dialysis with gangrenous changes to the third toe and fifth toe right foot.  Plan: Plan: Would recommend consulting vascular vein surgery to see if patient has any bypassable lesions.  I will start her on a nitroglycerin patch and [place] how to try to improve her microcirculation to her foot.  With patient's severe peripheral vascular disease I am uncertain whether she could heal a transmetatarsal amputation.  Thank you for the consult and  the opportunity to see Ms. Bettina Gavia, MD [facility] [phone] 5:47 PM

## 2017-03-24 NOTE — Plan of Care (Signed)
Problem: Skin Integrity: Goal: Risk for impaired skin integrity will decrease Outcome: Progressing Pt aware of need to receive IV antibiotics and keep dressing on foot.

## 2017-03-24 NOTE — ED Triage Notes (Signed)
Patient reports worsening right foot pain with mild swelling radiating to right leg onset yesterday morning , denies injury , pt. stated history of neuropathy , hemodialysis q Mon/Wed/Fri.

## 2017-03-24 NOTE — Progress Notes (Signed)
Received report from ED RN. Room ready for patient. Arihana Ambrocio Joselita, RN 

## 2017-03-24 NOTE — Consult Note (Signed)
Reason for Consult:Right foot fxs Referring Physician: D Boston [name] is an 41 y.o. female with multiple medical problems including ESRD on HD, DM, PVD, and HTN. HPI: [name] came to the ED yesterday with right foot/ankle pain and redness. She was admitted and started on abx. X-rays showed a 1st prox phalanx fx and a subacute 2nd MT fx. She notes no history of trauma. It's feeling better after being on abx.  Past Medical History:  Diagnosis Date  . Chronic anemia    2nd to renal disease  . CIN III (cervical intraepithelial neoplasia grade III) with severe dysplasia    S/P LEEP AND CONE  . Coronary artery disease    Status post cardiac catheterization June 2012 scattered coronary artery disease/atherosclerosis with 70-80% stenosis in a small right PDA.  . Diabetic Charcot's joint disease ([place])   . DM (diabetes mellitus) ([place])    Long-term insulin  . End stage renal disease on dialysis ([place]) 05/02/11   [facility]; M; W, F; last time 05/01/11  . Fracture of 5th metatarsal 2016   Right  . Gastroparesis   . 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 History of abscesses in groins 12/06/2010  . Hyperlipidemia    Hypertriglyceridemia 449 HDL 25  . Hypertension   . Migraines    "just on dialysis days"  . Peripheral neuropathy    related to DM  . Peripheral vascular disease ([place])    Tibial occlusive disease evaluated by Dr. Kellie Simmering in August 2011. Medical therapy  . Renal insufficiency    Dialysis since 2012  . Tobacco use disorder    Discontinued March 2012  . Trimalleolar fracture of ankle, closed 02/09/2015   Right    Past Surgical History:  Procedure Laterality Date  . AV FISTULA PLACEMENT  04/2010  . CARDIAC CATHETERIZATION [name] 02/10/2015   Procedure: Left Heart Cath and Coronary Angiography;  Surgeon: Dixie Dials, MD;  Location: [facility];  Service: Cardiovascular;  Laterality: [name];  . CARDIAC CATHETERIZATION [name] 02/21/2016   Procedure: Right Heart Cath;  Surgeon: Jolaine Artist, MD;  Location: [facility];  Service: Cardiovascular;  Laterality: [name];  . CERVICAL BIOPSY  W/ LOOP ELECTRODE EXCISION     h/o  . CERVICAL CONE BIOPSY     h/o  . COLPOSCOPY    . DILATION AND CURETTAGE OF UTERUS  2009  . ORIF ANKLE FRACTURE Right 02/09/2015  . ORIF ANKLE FRACTURE Right 02/16/2015   Procedure: OPEN REDUCTION INTERNAL FIXATION (ORIF) RIGHT ANKLE FRACTURE;  Surgeon: Altamese Northwoods, MD;  Location: [facility];  Service: Orthopedics;  Laterality: Right;    Family History  Problem Relation Age of Onset  . Diabetes Mother   . Hypertension Mother   . Diabetes Father   . Hyperlipidemia Father   . Hypertension Father   . Diabetes Sister   . Stroke Maternal Grandmother   . Diabetes Sister   . Breast cancer Maternal Aunt        Age 75's    Social History:  reports that she quit smoking about 5 years ago. Her smoking use included Cigarettes. She has a 3.00 pack-year smoking history. She has never used smokeless tobacco. She reports that she does not drink alcohol or use drugs.  Allergies:  Allergies  Allergen Reactions  . Cephalexin Other (See Comments)    Reaction unknown  . Sulfamethoxazole-Trimethoprim Other (See Comments)    Unknown reaction. Pt states that she was told by her mother  that she had allergy to Bactrim as a child.    Medications: I have reviewed the patient's current medications.  Results for orders placed or performed during the hospital encounter of 03/24/17 (from the past 48 hour(s))  CBC with Differential     Status: Abnormal   Collection Time: 03/24/17 12:32 AM  Result Value Ref Range   WBC 11.2 (H) 4.0 - 10.5 K/uL   RBC 3.73 (L) 3.87 - 5.11 MIL/uL   Hemoglobin 10.6 (L) 12.0 - 15.0 g/dL   HCT 34.2 (L) 36.0 - 46.0 %   MCV 91.7 78.0 - 100.0 fL   MCH 28.4 26.0 - 34.0 pg   MCHC 31.0 30.0 - 36.0 g/dL   RDW 17.9 (H) 11.5 - 15.5 %   Platelets 121 (L) 150 - 400 K/uL   Neutrophils Relative % 85 %   Neutro Abs 9.5 (H) 1.7 - 7.7 K/uL    Lymphocytes Relative 8 %   Lymphs Abs 0.9 0.7 - 4.0 K/uL   Monocytes Relative 6 %   Monocytes Absolute 0.6 0.1 - 1.0 K/uL   Eosinophils Relative 1 %   Eosinophils Absolute 0.1 0.0 - 0.7 K/uL   Basophils Relative 0 %   Basophils Absolute 0.0 0.0 - 0.1 K/uL  Comprehensive metabolic panel     Status: Abnormal   Collection Time: 03/24/17 12:32 AM  Result Value Ref Range   Sodium 138 135 - 145 mmol/L   Potassium 4.8 3.5 - 5.1 mmol/L   Chloride 96 (L) 101 - 111 mmol/L   CO2 26 22 - 32 mmol/L   Glucose, Bld 152 (H) 65 - 99 mg/dL   BUN 58 (H) 6 - 20 mg/dL   Creatinine, Ser 7.38 (H) 0.44 - 1.00 mg/dL   Calcium 8.7 (L) 8.9 - 10.3 mg/dL   Total Protein 6.8 6.5 - 8.1 g/dL   Albumin 3.4 (L) 3.5 - 5.0 g/dL   AST 18 15 - 41 U/L   ALT 15 14 - 54 U/L   Alkaline Phosphatase 236 (H) 38 - 126 U/L   Total Bilirubin 1.0 0.3 - 1.2 mg/dL   GFR calc non Af Amer 6 (L) >60 mL/min   GFR calc Af Amer 7 (L) >60 mL/min    Comment: (NOTE) The eGFR has been calculated using the CKD EPI equation. This calculation has not been validated in all clinical situations. eGFR's persistently <60 mL/min signify possible Chronic Kidney Disease.    Anion gap 16 (H) 5 - 15  Uric acid     Status: None   Collection Time: 03/24/17 12:32 AM  Result Value Ref Range   Uric Acid, Serum 6.5 2.3 - 6.6 mg/dL  Hemoglobin A1c     Status: Abnormal   Collection Time: 03/24/17  4:09 AM  Result Value Ref Range   Hgb A1c MFr Bld 9.5 (H) 4.8 - 5.6 %    Comment: (NOTE) Pre diabetes:          5.7%-6.4% Diabetes:              >6.4% Glycemic control for   <7.0% adults with diabetes    Mean Plasma Glucose 225.95 mg/dL  C-reactive protein     Status: Abnormal   Collection Time: 03/24/17  4:09 AM  Result Value Ref Range   CRP 1.8 (H) <1.0 mg/dL  Prealbumin     Status: None   Collection Time: 03/24/17  4:09 AM  Result Value Ref Range   Prealbumin 19.7 18 - 38 mg/dL  Basic metabolic panel     Status: Abnormal   Collection Time:  03/24/17  4:09 AM  Result Value Ref Range   Sodium 137 135 - 145 mmol/L   Potassium 4.9 3.5 - 5.1 mmol/L   Chloride 98 (L) 101 - 111 mmol/L   CO2 22 22 - 32 mmol/L   Glucose, Bld 85 65 - 99 mg/dL   BUN 60 (H) 6 - 20 mg/dL   Creatinine, Ser 7.49 (H) 0.44 - 1.00 mg/dL   Calcium 8.5 (L) 8.9 - 10.3 mg/dL   GFR calc non Af Amer 6 (L) >60 mL/min   GFR calc Af Amer 7 (L) >60 mL/min    Comment: (NOTE) The eGFR has been calculated using the CKD EPI equation. This calculation has not been validated in all clinical situations. eGFR's persistently <60 mL/min signify possible Chronic Kidney Disease.    Anion gap 17 (H) 5 - 15  CBC WITH DIFFERENTIAL     Status: Abnormal   Collection Time: 03/24/17  4:09 AM  Result Value Ref Range   WBC 11.4 (H) 4.0 - 10.5 K/uL   RBC 3.50 (L) 3.87 - 5.11 MIL/uL   Hemoglobin 10.5 (L) 12.0 - 15.0 g/dL   HCT 32.2 (L) 36.0 - 46.0 %   MCV 92.0 78.0 - 100.0 fL   MCH 30.0 26.0 - 34.0 pg   MCHC 32.6 30.0 - 36.0 g/dL   RDW 18.1 (H) 11.5 - 15.5 %   Platelets 109 (L) 150 - 400 K/uL    Comment: CONSISTENT WITH PREVIOUS RESULT   Neutrophils Relative % 80 %   Neutro Abs 9.1 (H) 1.7 - 7.7 K/uL   Lymphocytes Relative 13 %   Lymphs Abs 1.4 0.7 - 4.0 K/uL   Monocytes Relative 6 %   Monocytes Absolute 0.7 0.1 - 1.0 K/uL   Eosinophils Relative 1 %   Eosinophils Absolute 0.1 0.0 - 0.7 K/uL   Basophils Relative 0 %   Basophils Absolute 0.0 0.0 - 0.1 K/uL  Blood Cultures x 2 sites     Status: None (Preliminary result)   Collection Time: 03/24/17  4:20 AM  Result Value Ref Range   Specimen Description BLOOD RIGHT HAND    Special Requests      BOTTLES DRAWN AEROBIC AND ANAEROBIC Blood Culture adequate volume   Culture PENDING    Report Status PENDING   Blood Cultures x 2 sites     Status: None (Preliminary result)   Collection Time: 03/24/17  4:40 AM  Result Value Ref Range   Specimen Description BLOOD RIGHT FOREARM    Special Requests      BOTTLES DRAWN AEROBIC AND  ANAEROBIC Blood Culture adequate volume   Culture PENDING    Report Status PENDING   Sedimentation rate     Status: Abnormal   Collection Time: 03/24/17  5:26 AM  Result Value Ref Range   Sed Rate 25 (H) 0 - 22 mm/hr  MRSA PCR Screening     Status: None   Collection Time: 03/24/17  6:51 AM  Result Value Ref Range   MRSA by PCR NEGATIVE NEGATIVE    Comment:        The GeneXpert MRSA Assay (FDA approved for NASAL specimens only), is one component of a comprehensive MRSA colonization surveillance program. It is not intended to diagnose MRSA infection nor to guide or monitor treatment for MRSA infections.   Glucose, capillary     Status: None   Collection Time: 03/24/17  8:51  AM  Result Value Ref Range   Glucose-Capillary 82 65 - 99 mg/dL  Glucose, capillary     Status: Abnormal   Collection Time: 03/24/17 12:14 PM  Result Value Ref Range   Glucose-Capillary 162 (H) 65 - 99 mg/dL    Dg Foot 2 Views Left  Result Date: 03/24/2017 CLINICAL DATA:  Nonhealing toe ulcers. RIGHT leg swelling. History of diabetes, Charcot joint. EXAM: LEFT FOOT - 2 VIEW; RIGHT FOOT - 2 VIEW COMPARISON:  RIGHT foot radiograph February 13, 2015 and RIGHT ankle radiograph April 21, 2016. LEFT ankle radiograph February 02, 2017. FINDINGS: RIGHT foot: Acute to subacute corner fractures first proximal phalanx with intra-articular extension. No dislocation. Age indeterminate distal second metatarsal fracture. Unchanged intraosseous cyst first proximal phalanx. Old healed fifth metatarsus fracture. Osteopenia. Soft tissue swellings. Small plantar calcaneal spur. Severe vascular calcifications. Bandage overlying third digit with soft tissue swelling. No subcutaneous gas or radiopaque foreign bodies. Ankle hardware. LEFT foot: No acute fracture deformity or dislocation. Old second and third proximal phalanx fractures. Old fifth metatarsus fracture. Osteopenia. Potential cyst first proximal phalanx. Small plantar  calcaneal spur. Chronic offset of tarsal metatarsal joint space on lateral radiograph. Severe vascular calcifications. No subcutaneous gas or radiopaque foreign bodies. IMPRESSION: RIGHT foot: 1. Acute to subacute proximal first phalanx fractures. Age indeterminate second distal metatarsus fracture.Multiple old fractures. 2. Bandage over third toe, no radiographic findings of osteomyelitis though MRI is more sensitive. LEFT foot: 1. No acute fracture deformity or dislocation. Multiple old fractures. 2. No radiographic findings of osteomyelitis though, MRI is more sensitive. Electronically Signed   By: Elon Alas M.D.   On: 03/24/2017 05:27   Dg Foot 2 Views Right  Result Date: 03/24/2017 CLINICAL DATA:  Nonhealing toe ulcers. RIGHT leg swelling. History of diabetes, Charcot joint. EXAM: LEFT FOOT - 2 VIEW; RIGHT FOOT - 2 VIEW COMPARISON:  RIGHT foot radiograph February 13, 2015 and RIGHT ankle radiograph April 21, 2016. LEFT ankle radiograph February 02, 2017. FINDINGS: RIGHT foot: Acute to subacute corner fractures first proximal phalanx with intra-articular extension. No dislocation. Age indeterminate distal second metatarsal fracture. Unchanged intraosseous cyst first proximal phalanx. Old healed fifth metatarsus fracture. Osteopenia. Soft tissue swellings. Small plantar calcaneal spur. Severe vascular calcifications. Bandage overlying third digit with soft tissue swelling. No subcutaneous gas or radiopaque foreign bodies. Ankle hardware. LEFT foot: No acute fracture deformity or dislocation. Old second and third proximal phalanx fractures. Old fifth metatarsus fracture. Osteopenia. Potential cyst first proximal phalanx. Small plantar calcaneal spur. Chronic offset of tarsal metatarsal joint space on lateral radiograph. Severe vascular calcifications. No subcutaneous gas or radiopaque foreign bodies. IMPRESSION: RIGHT foot: 1. Acute to subacute proximal first phalanx fractures. Age indeterminate  second distal metatarsus fracture.Multiple old fractures. 2. Bandage over third toe, no radiographic findings of osteomyelitis though MRI is more sensitive. LEFT foot: 1. No acute fracture deformity or dislocation. Multiple old fractures. 2. No radiographic findings of osteomyelitis though, MRI is more sensitive. Electronically Signed   By: Elon Alas M.D.   On: 03/24/2017 05:27    Review of Systems  Constitutional: Negative for weight loss.  HENT: Negative for ear discharge, ear pain, hearing loss and tinnitus.   Eyes: Negative for blurred vision, double vision, photophobia and pain.  Respiratory: Negative for cough, sputum production and shortness of breath.   Cardiovascular: Negative for chest pain.  Gastrointestinal: Negative for abdominal pain, nausea and vomiting.  Genitourinary: Negative for dysuria, flank pain, frequency and urgency.  Musculoskeletal: Positive for  joint pain (Right foot/ankle). Negative for back pain, falls, myalgias and neck pain.  Neurological: Negative for dizziness, tingling, sensory change, focal weakness, loss of consciousness and headaches.  Endo/Heme/Allergies: Does not bruise/bleed easily.  Psychiatric/Behavioral: Negative for depression, memory loss and substance abuse. The patient is not nervous/anxious.    Blood pressure (!) 164/73, pulse 62, temperature 98.3 F (36.8 C), temperature source Oral, resp. rate 19, height '6\' 4"'  (1.93 m), weight 93.4 kg (206 lb), last menstrual period 10/09/2015, SpO2 97 %. Physical Exam  Constitutional: She appears well-developed and well-nourished. No distress.  HENT:  Head: Normocephalic.  Eyes: Conjunctivae are normal. Right eye exhibits no discharge. Left eye exhibits no discharge.  Neck: Normal range of motion.  Cardiovascular: Normal rate and regular rhythm.   Respiratory: Effort normal. No respiratory distress.  Musculoskeletal:  RLE No traumatic wounds, ecchymosis, or rash  Nontender mostly, would  occasionally have not well reproducible TTP diffusely  No knee or ankle effusion  Knee stable to varus/ valgus and anterior/posterior stress  Sens DPN, SPN, TN paresthetic  Motor EHL, ext, flex, evers 5/5  DP 1+, PT 1+, No significant edema, scattered embolic areas, ulcerations noted 5th toe, 1st MT head. 3rd toe desquamated  LLE No traumatic wounds, ecchymosis, or rash  Nontender  No knee or ankle effusion  Knee stable to varus/ valgus and anterior/posterior stress  Sens DPN, SPN, TN paresthetic  Motor EHL, ext, flex, evers 5/5  DP 1+, PT 1+, No significant edema, scattered embolic areas  Neurological: She is alert.  Skin: Skin is warm and dry. She is not diaphoretic.  Psychiatric: She has a normal mood and affect. Her behavior is normal.    Assessment/Plan: Right 1st prox phalanx fxs/2nd MT fx -- Likely insufficiency fxs though osteo cannot be ruled out. Will get MRI to assess. Hard soled shoe for treatment of fxs. Dr. Sharol Given to evaluate.    Lisette Abu, PA-C Orthopedic Surgery [phone] 03/24/2017, 12:52 PM

## 2017-03-24 NOTE — ED Notes (Signed)
Nurse getting 2nd set of blood cultures 

## 2017-03-24 NOTE — ED Notes (Signed)
The pt is c/o rt foot and leg pain for approx  2 weeks  Her foot started first then her rt lower leg  She has sores and scabs on her rt lower leg  Redness in her  Rt lower leg and now today the pain has increased and her entire leg from her foot to her hip is hurting

## 2017-03-24 NOTE — Progress Notes (Signed)
Pharmacy Antibiotic Note  [name] is a 41 y.o. female admitted on 03/24/2017 with diabetic wound infection.  Pharmacy has been consulted for Vancomycin/Aztreonam dosing. WBC 11.2. ESRD on HD MWF.   Plan: Vancomycin 2000 mg IV x 1, then 1000 mg IV qHD MWF Aztreonam 500 mg IV q12h Flagyl per MD Trend WBC, temp, HD schedule  F/U infectious work-up Drug levels as indicated   Height: 6\' 4"  (193 cm) Weight: 206 lb (93.4 kg) IBW/kg (Calculated) : 82.3  Temp (24hrs), Avg:98.2 F (36.8 C), Min:98.2 F (36.8 C), Max:98.2 F (36.8 C)   Recent Labs Lab 03/24/17 0032  WBC 11.2*  CREATININE 7.38*    Estimated Creatinine Clearance: 13 mL/min (A) (by C-G formula based on SCr of 7.38 mg/dL (H)).    Allergies  Allergen Reactions  . Cephalexin Other (See Comments)    Reaction unknown  . Sulfamethoxazole-Trimethoprim Other (See Comments)    Unknown reaction. Pt states that she was told by her mother that she had allergy to Bactrim as a child.    Narda Bonds 03/24/2017 4:13 AM

## 2017-03-24 NOTE — Consult Note (Signed)
[facility] Renal Consultation Note    Indication for Consultation:  Management of ESRD/hemodialysis; anemia, hypertension/volume and secondary hyperparathyroidism PCP:  HPI: [name] is a 41 y.o. female with ESRD secondary to DM. HD initiated 07/2010. PMH includes HTN, diabetic retinopathy, CAD s/p NSTEMI, HLD, PVD, migraines.   She is admitted with R foot pain and nonhealing uclers of toes. Foot pain is new symptom and began this weekend. Pain became so severe the presented to [facility] ED this am. Labs notable for leukocytosis WBC 11.4, A1C 9.5. Blood cultures collected and empiric antibiotics started for diabetic foot wound. Foot xray showing Acute to subacute proximal first phalanx fractures, multiple old fractures.   Seen at bedside, altert no acute distress. Endorses chronic abdominal pain, nausea. Foot pain improved since admission. Denies fever, chills, CP, SOB, vomiting, diarrhea.   Dialyzes [place] MWF. Last HD Friday 10/26. Usually leaves 3-4kg over EDW..Recently referred for outpatient paracentesis for ascites.   Past Medical History:  Diagnosis Date  . Chronic anemia    2nd to renal disease  . CIN III (cervical intraepithelial neoplasia grade III) with severe dysplasia    S/P LEEP AND CONE  . Coronary artery disease    Status post cardiac catheterization June 2012 scattered coronary artery disease/atherosclerosis with 70-80% stenosis in a small right PDA.  . Diabetic Charcot's joint disease ([place])   . DM (diabetes mellitus) ([place])    Long-term insulin  . End stage renal disease on dialysis ([place]) 05/02/11   [facility]; M; W, F; last time 05/01/11  . Fracture of 5th metatarsal 2016   Right  . Gastroparesis   . 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 History of abscesses in groins 12/06/2010  . Hyperlipidemia    Hypertriglyceridemia 449 HDL 25  . Hypertension   . Migraines    "just on dialysis days"  . Peripheral neuropathy    related to DM  . Peripheral vascular disease  ([place])    Tibial occlusive disease evaluated by Dr. Kellie Simmering in August 2011. Medical therapy  . Renal insufficiency    Dialysis since 2012  . Tobacco use disorder    Discontinued March 2012  . Trimalleolar fracture of ankle, closed 02/09/2015   Right   Past Surgical History:  Procedure Laterality Date  . AV FISTULA PLACEMENT  04/2010  . CARDIAC CATHETERIZATION [name] 02/10/2015   Procedure: Left Heart Cath and Coronary Angiography;  Surgeon: Dixie Dials, MD;  Location: [place] [facility];  Service: Cardiovascular;  Laterality: [name];  . CARDIAC CATHETERIZATION [name] 02/21/2016   Procedure: Right Heart Cath;  Surgeon: Jolaine Artist, MD;  Location: [place] [facility];  Service: Cardiovascular;  Laterality: [name];  . CERVICAL BIOPSY  W/ LOOP ELECTRODE EXCISION     h/o  . CERVICAL CONE BIOPSY     h/o  . COLPOSCOPY    . DILATION AND CURETTAGE OF UTERUS  2009  . ORIF ANKLE FRACTURE Right 02/09/2015  . ORIF ANKLE FRACTURE Right 02/16/2015   Procedure: OPEN REDUCTION INTERNAL FIXATION (ORIF) RIGHT ANKLE FRACTURE;  Surgeon: Altamese [place], MD;  Location: [place];  Service: Orthopedics;  Laterality: Right;   Family History  Problem Relation Age of Onset  . Diabetes Mother   . Hypertension Mother   . Diabetes Father   . Hyperlipidemia Father   . Hypertension Father   . Diabetes Sister   . Stroke Maternal Grandmother   . Diabetes Sister   . Breast cancer Maternal Aunt  Age 40's   Social History:  reports that she quit smoking about 5 years ago. Her smoking use included Cigarettes. She has a 3.00 pack-year smoking history. She has never used smokeless tobacco. She reports that she does not drink alcohol or use drugs. Allergies  Allergen Reactions  . Cephalexin Other (See Comments)    Reaction unknown  . Sulfamethoxazole-Trimethoprim Other (See Comments)    Unknown reaction. Pt states that she was told by her mother that she had allergy to Bactrim as a child.   Prior to Admission  medications   Medication Sig Start Date End Date Taking? Authorizing Provider  amLODipine (NORVASC) 10 MG tablet Take 1 tablet (10 mg total) by mouth daily. 02/24/16  Yes Tat, Shanon Brow, MD  amoxicillin-clavulanate (AUGMENTIN) 500-125 MG tablet Take 1 tablet (500 mg total) by mouth daily. X 7 days, then on Mon-Wed-Fri only after dialysis 02/24/16  Yes Tat, David, MD  atorvastatin (LIPITOR) 40 MG tablet Take 40 mg by mouth daily.   Yes [provider]  cloNIDine (CATAPRES) 0.3 MG tablet Take 1 tablet (0.3 mg total) by mouth 3 (three) times daily. 11/01/14  Yes Debbe Odea, MD  cyclobenzaprine (FLEXERIL) 10 MG tablet Take 1 tablet (10 mg total) by mouth at bedtime. Patient taking differently: Take 10 mg by mouth at bedtime as needed for muscle spasms.  03/12/17  Yes Magnus Sinning, MD  Ferric Citrate (AURYXIA PO) Take 2 tablets by mouth 3 (three) times daily with meals.   Yes [provider]  HUMALOG MIX 50/50 KWIKPEN (50-50) 100 UNIT/ML Kwikpen Inject 26 Units into the skin 2 (two) times daily.  04/29/14  Yes [provider]  hydrALAZINE (APRESOLINE) 100 MG tablet Take 1 tablet (100 mg total) by mouth every 8 (eight) hours. 02/24/16  Yes Tat, Shanon Brow, MD  HYDROcodone-acetaminophen (NORCO) 5-325 MG tablet Take 1 tablet by mouth every 6 (six) hours as needed for moderate pain. 02/02/17  Yes Kirichenko, Tatyana, PA-C  insulin lispro (HUMALOG) 100 UNIT/ML injection Inject 6 Units into the skin 3 (three) times daily before meals.   Yes [provider]  isosorbide mononitrate (IMDUR) 120 MG 24 hr tablet Take 1 tablet (120 mg total) by mouth daily. 02/25/16  Yes Tat, Shanon Brow, MD  labetalol (NORMODYNE) 300 MG tablet Take 1 tablet (300 mg total) by mouth 3 (three) times daily. 11/01/14  Yes Debbe Odea, MD  RENVELA 800 MG tablet Take 4,800 mg by mouth 3 (three) times daily with meals.  04/17/13  Yes [provider]  traMADol (ULTRAM) 50 MG tablet Take 1 tablet (50 mg total) by  mouth every 6 (six) hours as needed for moderate pain (or Headache unrelieved by tylenol). 02/24/16  Yes TatShanon Brow, MD   Current Facility-Administered Medications  Medication Dose Route Frequency Provider Last Rate Last Dose  . 0.9 %  sodium chloride infusion  250 mL Intravenous PRN Opyd, Ilene Qua, MD 10 mL/hr at 03/24/17 0409 250 mL at 03/24/17 0409  . acetaminophen (TYLENOL) tablet 650 mg  650 mg Oral Q6H PRN Opyd, Ilene Qua, MD       Or  . acetaminophen (TYLENOL) suppository 650 mg  650 mg Rectal Q6H PRN Opyd, Ilene Qua, MD      . amLODipine (NORVASC) tablet 10 mg  10 mg Oral Daily Opyd, Ilene Qua, MD   10 mg at 03/24/17 1050  . atorvastatin (LIPITOR) tablet 40 mg  40 mg Oral q1800 Opyd, Ilene Qua, MD      . aztreonam (  AZACTAM) 500 mg in dextrose 5 % 50 mL IVPB  500 mg Intravenous Q12H Erenest Blank, [facility]   Stopped at 03/24/17 6222  . bisacodyl (DULCOLAX) EC tablet 5 mg  5 mg Oral Daily PRN Opyd, Ilene Qua, MD      . cloNIDine (CATAPRES) tablet 0.3 mg  0.3 mg Oral TID Opyd, Ilene Qua, MD   0.3 mg at 03/24/17 1049  . cyclobenzaprine (FLEXERIL) tablet 10 mg  10 mg Oral QHS Opyd, Timothy S, MD      . fentaNYL (SUBLIMAZE) injection 50 mcg  50 mcg Intravenous Q2H PRN Opyd, Ilene Qua, MD      . heparin injection 5,000 Units  5,000 Units Subcutaneous Q8H Opyd, Ilene Qua, MD   5,000 Units at 03/24/17 0650  . hydrALAZINE (APRESOLINE) tablet 100 mg  100 mg Oral Q8H Opyd, Ilene Qua, MD   100 mg at 03/24/17 0644  . HYDROcodone-acetaminophen (NORCO/VICODIN) 5-325 MG per tablet 1-2 tablet  1-2 tablet Oral Q6H PRN Opyd, Ilene Qua, MD      . insulin aspart (novoLOG) injection 0-5 Units  0-5 Units Subcutaneous QHS Opyd, Timothy S, MD      . insulin aspart (novoLOG) injection 0-9 Units  0-9 Units Subcutaneous TID WC Opyd, Ilene Qua, MD      . Derrill Memo ON 03/25/2017] insulin detemir (LEVEMIR) injection 20 Units  20 Units Subcutaneous Daily Rosita Fire, MD      . isosorbide mononitrate (IMDUR) 24 hr  tablet 120 mg  120 mg Oral Daily Opyd, Ilene Qua, MD   120 mg at 03/24/17 1050  . labetalol (NORMODYNE) tablet 300 mg  300 mg Oral TID Vianne Bulls, MD   300 mg at 03/24/17 1050  . metroNIDAZOLE (FLAGYL) IVPB 500 mg  500 mg Intravenous Q8H Opyd, Ilene Qua, MD   Stopped at 03/24/17 9798  . ondansetron (ZOFRAN) tablet 4 mg  4 mg Oral Q6H PRN Opyd, Ilene Qua, MD       Or  . ondansetron (ZOFRAN) injection 4 mg  4 mg Intravenous Q6H PRN Opyd, Ilene Qua, MD   4 mg at 03/24/17 0439  . oxyCODONE-acetaminophen (PERCOCET/ROXICET) 5-325 MG per tablet           . senna-docusate (Senokot-S) tablet 1 tablet  1 tablet Oral QHS PRN Opyd, Ilene Qua, MD      . sevelamer carbonate (RENVELA) tablet 4,800 mg  4,800 mg Oral TID WC Opyd, Ilene Qua, MD   4,800 mg at 03/24/17 0846  . sodium chloride flush (NS) 0.9 % injection 3 mL  3 mL Intravenous Q12H Opyd, Ilene Qua, MD   3 mL at 03/24/17 1051  . sodium chloride flush (NS) 0.9 % injection 3 mL  3 mL Intravenous PRN Opyd, Ilene Qua, MD      . traMADol (ULTRAM) tablet 50 mg  50 mg Oral Q6H PRN Opyd, Ilene Qua, MD      . vancomycin (VANCOCIN) IVPB 1000 mg/200 mL premix  1,000 mg Intravenous Q M,W,F-HD [place], James L, RPH        ROS: As per HPI otherwise negative.  Physical Exam: Vitals:   03/24/17 0300 03/24/17 0400 03/24/17 0629 03/24/17 0800  BP: (!) 170/81 (!) 171/89 (!) 172/75 (!) 164/73  Pulse: 69 75 63 62  Resp:   19 19  Temp:   98.3 F (36.8 C) 98.3 F (36.8 C)  TempSrc:   Oral Oral  SpO2: 96% 95% 97% 97%  Weight:  Height:   6\' 4"  (1.93 m) 6\' 4"  (1.93 m)     General: WDWN NAD  Head: NCAT sclera not icteric MMM Neck: Supple. No JVD No masses Lungs: CTA bilaterally without wheezes, rales, or rhonchi. Breathing is unlabored. Heart: RRR 2/6 SEM  Abdomen: soft mildly distended  Lower extremities: 1+ LE edema, bilat, R foot tender to palpation, cool, mild erythema, superficial ulcers to 2nd, 3rd toes Neuro: A & O  X 3. Moves all extremities  spontaneously. Psych:  Responds to questions appropriately with a normal affect. Dialysis Access: LUE AVF +bruit   Labs: Basic Metabolic Panel:  Recent Labs Lab 03/24/17 0032 03/24/17 0409  NA 138 137  K 4.8 4.9  CL 96* 98*  CO2 26 22  GLUCOSE 152* 85  BUN 58* 60*  CREATININE 7.38* 7.49*  CALCIUM 8.7* 8.5*   Liver Function Tests:  Recent Labs Lab 03/24/17 0032  AST 18  ALT 15  ALKPHOS 236*  BILITOT 1.0  PROT 6.8  ALBUMIN 3.4*   No results for input(s): LIPASE, AMYLASE in the last 168 hours. No results for input(s): AMMONIA in the last 168 hours. CBC:  Recent Labs Lab 03/24/17 0032 03/24/17 0409  WBC 11.2* 11.4*  NEUTROABS 9.5* 9.1*  HGB 10.6* 10.5*  HCT 34.2* 32.2*  MCV 91.7 92.0  PLT 121* 109*   Cardiac Enzymes: No results for input(s): CKTOTAL, CKMB, CKMBINDEX, TROPONINI in the last 168 hours. CBG:  Recent Labs Lab 03/24/17 0851  GLUCAP 82   Iron Studies: No results for input(s): IRON, TIBC, TRANSFERRIN, FERRITIN in the last 72 hours. Studies/Results: Dg Foot 2 Views Left  Result Date: 03/24/2017 CLINICAL DATA:  Nonhealing toe ulcers. RIGHT leg swelling. History of diabetes, Charcot joint. EXAM: LEFT FOOT - 2 VIEW; RIGHT FOOT - 2 VIEW COMPARISON:  RIGHT foot radiograph February 13, 2015 and RIGHT ankle radiograph April 21, 2016. LEFT ankle radiograph February 02, 2017. FINDINGS: RIGHT foot: Acute to subacute corner fractures first proximal phalanx with intra-articular extension. No dislocation. Age indeterminate distal second metatarsal fracture. Unchanged intraosseous cyst first proximal phalanx. Old healed fifth metatarsus fracture. Osteopenia. Soft tissue swellings. Small plantar calcaneal spur. Severe vascular calcifications. Bandage overlying third digit with soft tissue swelling. No subcutaneous gas or radiopaque foreign bodies. Ankle hardware. LEFT foot: No acute fracture deformity or dislocation. Old second and third proximal phalanx  fractures. Old fifth metatarsus fracture. Osteopenia. Potential cyst first proximal phalanx. Small plantar calcaneal spur. Chronic offset of tarsal metatarsal joint space on lateral radiograph. Severe vascular calcifications. No subcutaneous gas or radiopaque foreign bodies. IMPRESSION: RIGHT foot: 1. Acute to subacute proximal first phalanx fractures. Age indeterminate second distal metatarsus fracture.Multiple old fractures. 2. Bandage over third toe, no radiographic findings of osteomyelitis though MRI is more sensitive. LEFT foot: 1. No acute fracture deformity or dislocation. Multiple old fractures. 2. No radiographic findings of osteomyelitis though, MRI is more sensitive. Electronically Signed   By: Elon Alas M.D.   On: 03/24/2017 05:27   Dg Foot 2 Views Right  Result Date: 03/24/2017 CLINICAL DATA:  Nonhealing toe ulcers. RIGHT leg swelling. History of diabetes, Charcot joint. EXAM: LEFT FOOT - 2 VIEW; RIGHT FOOT - 2 VIEW COMPARISON:  RIGHT foot radiograph February 13, 2015 and RIGHT ankle radiograph April 21, 2016. LEFT ankle radiograph February 02, 2017. FINDINGS: RIGHT foot: Acute to subacute corner fractures first proximal phalanx with intra-articular extension. No dislocation. Age indeterminate distal second metatarsal fracture. Unchanged intraosseous cyst first proximal phalanx. Old healed fifth  metatarsus fracture. Osteopenia. Soft tissue swellings. Small plantar calcaneal spur. Severe vascular calcifications. Bandage overlying third digit with soft tissue swelling. No subcutaneous gas or radiopaque foreign bodies. Ankle hardware. LEFT foot: No acute fracture deformity or dislocation. Old second and third proximal phalanx fractures. Old fifth metatarsus fracture. Osteopenia. Potential cyst first proximal phalanx. Small plantar calcaneal spur. Chronic offset of tarsal metatarsal joint space on lateral radiograph. Severe vascular calcifications. No subcutaneous gas or radiopaque foreign  bodies. IMPRESSION: RIGHT foot: 1. Acute to subacute proximal first phalanx fractures. Age indeterminate second distal metatarsus fracture.Multiple old fractures. 2. Bandage over third toe, no radiographic findings of osteomyelitis though MRI is more sensitive. LEFT foot: 1. No acute fracture deformity or dislocation. Multiple old fractures. 2. No radiographic findings of osteomyelitis though, MRI is more sensitive. Electronically Signed   By: Elon Alas M.D.   On: 03/24/2017 05:27    Dialysis Orders:  [facility] MWF 4.25h 180F 450/A1.5x EDW 89 2K/2Ca Profile 2 L AVF Heparin 2000U Hectorol 34mcg IV tiw Venofer 50mg  IV q Wed Mircera 200 mcg IV q 2 weeks (last dosed 10/17) BMM: Auryixa 3 tabs qac, Renvela 6 qac, 2 q snacks, Sensipar 60 qd   Assessment/Plan: 1. R foot wound-  Subacute/old phalanx fractures on xray, neg for osteo, MRI pending. Antibiotics per primary. Cultures pending 2. ESRD -  MWF. Cont on schedule. For HD today  3. Hypertension/volume  - BP elevated likely with volume/ UF 4-5L today, try to get weight down here  4. Anemia  - Hgb 10.5, Next ESA due 10/31  5. Metabolic bone disease -  Cont VDRA/binders  6. Nutrition - Renal diet/vitamins/prostat for low albumin  7. DM- per primary  8. H/o ascites    Lynnda Child PA-C [place] Pager [phone] 03/24/2017, 11:45 AM   Pt seen, examined, agree w assess/plan as above with additions as indicated. Pt admitted with pain / redness R foot, probable cellulitis.  Started on IV abx.  Due to HD today, see orders.  Kelly Splinter MD [employer] pager [phone]    cell [phone] 03/24/2017, 1:05 PM

## 2017-03-24 NOTE — H&P (Signed)
History and Physical    [name] ZRA:[id number] DOB: 1975-10-09 DOA: 03/24/2017  PCP: Janet Seashore, MD   Patient coming from: Home  Chief Complaint: Right lower leg swelling, redness, pain, drainage   HPI: [name] is a 41 y.o. female with medical history significant for hypertension, insulin-dependent diabetes mellitus, coronary artery disease, chronic anemia, and end-stage renal disease on hemodialysis, now presenting to the emergency department for evaluation of right lower leg pain, swelling, redness, and drainage.  Patient reports that she had been in her usual state until 24 hrs prior to arrival when she noted increased pain, swelling, and erythema involving the right lower leg.  She states that there is has continued to worsen over the ensuing hours and has become severe.  She denies fevers per se, but does report some chills and malaise.  She reports nonhealing ulcers involving the toes of bilateral feet.  Denies chest pain or palpitations.  No significant dyspnea.  ED Course: Upon arrival to the ED, patient is found to be afebrile, saturating well on room air, hypertensive to 198/90, and with vitals otherwise stable.  Chemistry panel is notable for a glucose of 152 and alkaline phosphatase of 236.  CBC is notable for a leukocytosis to 11,200 and a stable chronic normocytic anemia with hemoglobin of 10.6.  Also notable on CBC is a thrombocytopenia with platelets 121,000, improved from prior.  The patient was treated with fentanyl, Percocet, Toradol, ciprofloxacin, clindamycin, and vancomycin in the stable, though hypertensive, and in no apparent respiratory distress, and she will be admitted to the medical-surgical unit for ongoing right leg cellulitis in a diabetic patient.  Review of Systems:  All other systems reviewed and apart from HPI, are negative.  Past Medical History:  Diagnosis Date  . Chronic anemia    2nd to renal disease  . CIN III (cervical  intraepithelial neoplasia grade III) with severe dysplasia    S/P LEEP AND CONE  . Coronary artery disease    Status post cardiac catheterization June 2012 scattered coronary artery disease/atherosclerosis with 70-80% stenosis in a small right PDA.  . Diabetic Charcot's joint disease ([place])   . DM (diabetes mellitus) ([place])    Long-term insulin  . End stage renal disease on dialysis ([place]) 05/02/11   [facility]; M; W, F; last time 05/01/11  . Fracture of 5th metatarsal 2016   Right  . Gastroparesis   . 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 History of abscesses in groins 12/06/2010  . Hyperlipidemia    Hypertriglyceridemia 449 HDL 25  . Hypertension   . Migraines    "just on dialysis days"  . Peripheral neuropathy    related to DM  . Peripheral vascular disease ([place])    Tibial occlusive disease evaluated by Dr. Kellie Simmering in August 2011. Medical therapy  . Renal insufficiency    Dialysis since 2012  . Tobacco use disorder    Discontinued March 2012  . Trimalleolar fracture of ankle, closed 02/09/2015   Right    Past Surgical History:  Procedure Laterality Date  . AV FISTULA PLACEMENT  04/2010  . CARDIAC CATHETERIZATION [name] 02/10/2015   Procedure: Left Heart Cath and Coronary Angiography;  Surgeon: Janet Dials, MD;  Location: [facility];  Service: Cardiovascular;  Laterality: [name];  . CARDIAC CATHETERIZATION [name] 02/21/2016   Procedure: Right Heart Cath;  Surgeon: Janet Artist, MD;  Location: [facility];  Service: Cardiovascular;  Laterality: [name];  . CERVICAL BIOPSY  W/  LOOP ELECTRODE EXCISION     h/o  . CERVICAL CONE BIOPSY     h/o  . COLPOSCOPY    . DILATION AND CURETTAGE OF UTERUS  2009  . ORIF ANKLE FRACTURE Right 02/09/2015  . ORIF ANKLE FRACTURE Right 02/16/2015   Procedure: OPEN REDUCTION INTERNAL FIXATION (ORIF) RIGHT ANKLE FRACTURE;  Surgeon: Janet Edmonson, MD;  Location: [facility];  Service: Orthopedics;  Laterality: Right;     reports that she quit smoking about 5 years  ago. Her smoking use included Cigarettes. She has a 3.00 pack-year smoking history. She has never used smokeless tobacco. She reports that she does not drink alcohol or use drugs.  Allergies  Allergen Reactions  . Cephalexin Other (See Comments)    Reaction unknown  . Sulfamethoxazole-Trimethoprim Other (See Comments)    Unknown reaction. Pt states that she was told by her mother that she had allergy to Bactrim as a child.    Family History  Problem Relation Age of Onset  . Diabetes Mother   . Hypertension Mother   . Diabetes Father   . Hyperlipidemia Father   . Hypertension Father   . Diabetes Sister   . Stroke Maternal Grandmother   . Diabetes Sister   . Breast cancer Maternal Aunt        Age 95's     Prior to Admission medications   Medication Sig Start Date End Date Taking? Authorizing Provider  amLODipine (NORVASC) 10 MG tablet Take 1 tablet (10 mg total) by mouth daily. 02/24/16   Orson Eva, MD  amoxicillin-clavulanate (AUGMENTIN) 500-125 MG tablet Take 1 tablet (500 mg total) by mouth daily. X 7 days, then on Mon-Wed-Fri only after dialysis 02/24/16   Tat, David, MD  atorvastatin (LIPITOR) 40 MG tablet Take 40 mg by mouth daily.    [provider]  cloNIDine (CATAPRES) 0.3 MG tablet Take 1 tablet (0.3 mg total) by mouth 3 (three) times daily. 11/01/14   Debbe Odea, MD  cyclobenzaprine (FLEXERIL) 10 MG tablet Take 1 tablet (10 mg total) by mouth at bedtime. 03/12/17   Janet Sinning, MD  Ferric Citrate (AURYXIA PO) Take 2 tablets by mouth 3 (three) times daily with meals.    [provider]  HUMALOG MIX 50/50 KWIKPEN (50-50) 100 UNIT/ML Kwikpen Inject 26-28 Units into the skin 2 (two) times daily. 28 in the morning and 26 in the evening. 04/29/14   [provider]  hydrALAZINE (APRESOLINE) 100 MG tablet Take 1 tablet (100 mg total) by mouth every 8 (eight) hours. 02/24/16   Orson Eva, MD  HYDROcodone-acetaminophen (NORCO) 5-325 MG tablet Take 1  tablet by mouth every 6 (six) hours as needed for moderate pain. 02/02/17   [name], Tatyana, PA-C  isosorbide mononitrate (IMDUR) 120 MG 24 hr tablet Take 1 tablet (120 mg total) by mouth daily. 02/25/16   Orson Eva, MD  labetalol (NORMODYNE) 300 MG tablet Take 1 tablet (300 mg total) by mouth 3 (three) times daily. 11/01/14   Debbe Odea, MD  RENVELA 800 MG tablet Take 4,800 mg by mouth 3 (three) times daily with meals.  04/17/13   [provider]  traMADol (ULTRAM) 50 MG tablet Take 1 tablet (50 mg total) by mouth every 6 (six) hours as needed for moderate pain (or Headache unrelieved by tylenol). 02/24/16   Orson Eva, MD    Physical Exam: Vitals:   03/24/17 0031 03/24/17 0032 03/24/17 0215 03/24/17 0300  BP: (!) 198/87  (!) 174/78 (!) 170/81  Pulse:  79   69  Resp: 16     Temp: 98.2 F (36.8 C)     TempSrc: Oral     SpO2: 96%   96%  Weight:  93.4 kg (206 lb)    Height:  '6\' 4"'  (1.93 m)        Constitutional: NAD, calm, in obvious discomfort Eyes: PERTLA, lids and conjunctivae normal ENMT: Mucous membranes are moist. Posterior pharynx clear of any exudate or lesions.   Neck: normal, supple, no masses, no thyromegaly Respiratory: clear to auscultation bilaterally, occasional wheeze, no crackles. Normal respiratory effort.    Cardiovascular: S1 & S2 heard, regular rate and rhythm. Trace pretibial edema bilaterally.  Abdomen: Distended with fluid-wave, soft, no tenderness. Bowel sounds normal.  Musculoskeletal: no clubbing / cyanosis. No joint deformity upper and lower extremities.    Skin: Ulcers to multiple digits on bilateral feet with discoloration and atrophy. Erythema and tenderness extends from right foot up to the knee.  Neurologic: CN 2-12 grossly intact. Sensation intact. Strength 5/5 in all 4 limbs.  Psychiatric:  Alert and oriented x 3. Calm, cooperative.     Labs on Admission: I have personally reviewed following labs and imaging studies  CBC:  Recent  Labs Lab 03/24/17 0032  WBC 11.2*  NEUTROABS 9.5*  HGB 10.6*  HCT 34.2*  MCV 91.7  PLT 841*   Basic Metabolic Panel:  Recent Labs Lab 03/24/17 0032  NA 138  K 4.8  CL 96*  CO2 26  GLUCOSE 152*  BUN 58*  CREATININE 7.38*  CALCIUM 8.7*   GFR: Estimated Creatinine Clearance: 13 mL/min (A) (by C-G formula based on SCr of 7.38 mg/dL (H)). Liver Function Tests:  Recent Labs Lab 03/24/17 0032  AST 18  ALT 15  ALKPHOS 236*  BILITOT 1.0  PROT 6.8  ALBUMIN 3.4*   No results for input(s): LIPASE, AMYLASE in the last 168 hours. No results for input(s): AMMONIA in the last 168 hours. Coagulation Profile: No results for input(s): INR, PROTIME in the last 168 hours. Cardiac Enzymes: No results for input(s): CKTOTAL, CKMB, CKMBINDEX, TROPONINI in the last 168 hours. BNP (last 3 results) No results for input(s): PROBNP in the last 8760 hours. HbA1C: No results for input(s): HGBA1C in the last 72 hours. CBG: No results for input(s): GLUCAP in the last 168 hours. Lipid Profile: No results for input(s): CHOL, HDL, LDLCALC, TRIG, CHOLHDL, LDLDIRECT in the last 72 hours. Thyroid Function Tests: No results for input(s): TSH, T4TOTAL, FREET4, T3FREE, THYROIDAB in the last 72 hours. Anemia Panel: No results for input(s): VITAMINB12, FOLATE, FERRITIN, TIBC, IRON, RETICCTPCT in the last 72 hours. Urine analysis:    Component Value Date/Time   COLORURINE YELLOW 02/02/2017 1123   APPEARANCEUR CLOUDY (A) 02/02/2017 1123   LABSPEC 1.008 02/02/2017 1123   PHURINE 9.0 (H) 02/02/2017 1123   GLUCOSEU >=500 (A) 02/02/2017 1123   HGBUR NEGATIVE 02/02/2017 1123   BILIRUBINUR NEGATIVE 02/02/2017 1123   KETONESUR NEGATIVE 02/02/2017 1123   PROTEINUR >=300 (A) 02/02/2017 1123   UROBILINOGEN 0.2 02/08/2011 1812   NITRITE NEGATIVE 02/02/2017 1123   LEUKOCYTESUR NEGATIVE 02/02/2017 1123   Sepsis Labs: '@LABRCNTIP' (procalcitonin:4,lacticidven:4) )No results found for this or any previous  visit (from the past 240 hour(s)).   Radiological Exams on Admission: No results found.  EKG: not performed.   Assessment/Plan  1. RLE cellulitis, suspected PAD  - Pt presents with progressive erythema, swelling, tenderness involving lower right leg and is noted to have non-healing ulcers involving bilateral  feet  - Feet are warm and pink, but pulses not palpable  - Mild leukocytosis, but no fever here  - Blood cultures collected in ED  - Plan to check radiographs of bilateral feet for bony involvement, ABI to eval for PAD, check ESR and CRP - Start aztreonam, Flagyl, and vancomycin    2. ESRD  - Reports completing HD on 10/26 without incident  - Is hypertensive and a little volume-up on admission, but with normal potassium and bicarb, and on respiratory distress  - SLIV, fluid-restricted renal diet  - Message left with nephro consult line requesting HD    3. Insulin-dependent DM  - A1c was 11.2% one year ago - Managed at home with Humalog 50/50 28 units qAM and 26 units qPM  - Start Levemir 20 BID with low-intensity SSI while in hospital    4. Hypertension with hypertensive urgency  - BP elevated as high 200/90 range in ED with pain likely contributing  - Continue hydralazine, labetalol, clonidine; use hydralazine IVP's prn, control pain  - Anticipate improvement with HD    5. Anemia, thrombocytopenia  - Hgb is 10.6 and platelets 121k on admission  - Both indices appear to be stable and no bleeding is evident  - Continue iron supplementation    6. CAD - No anginal complaints  - Continue statin, beta-blocker, nitrates    7. Abdominal ascites  - Abdomen is distended but soft and non-tender  - She denies liver disease or cancer and reports that it has been attributed to CHF and ESRD  - Pt requires paracenteses and has this scheduled for 10/30    DVT prophylaxis: sq heparin Code Status: Full  Family Communication: Discussed with patient Disposition Plan: Admit to  med-surg Consults called: Nephrology Admission status: Inpatient     Vianne Bulls, MD [facility] Pager [phone]  If 7PM-7AM, please contact night-coverage [email or web address] Password [email or web address]  03/24/2017, 4:01 AM

## 2017-03-24 NOTE — Procedures (Signed)
[name] is a pleasant 41 year old female but with significant type 2 diabetes and complications of end-stage renal disease.  She is on the transplantation list.  She does undergo hemodialysis.  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  We are going to completed interlaminar epidural steroid injection at L4-5 which gave her relief in the past.  We have seen her in the office for recent reevaluation.  Please see our prior evaluation and management note for further details and justification.  Lumbar Epidural Steroid Injection - Interlaminar Approach with Fluoroscopic Guidance  Patient: [name]      Date of Birth: Sep 05, 1975 MRN: [id number] PCP: Merrilee Seashore, MD      Visit Date: 03/12/2017   Universal Protocol:     Consent Given By: the patient  Position: PRONE  Additional Comments: Vital signs were monitored before and after the procedure. Patient was prepped and draped in the usual sterile fashion. The correct patient, procedure, and site was verified.   Injection Procedure Details:  Procedure Site One Meds Administered:  Meds ordered this encounter  Medications  . lidocaine (PF) (XYLOCAINE) 1 % injection 2 mL  . betamethasone acetate-betamethasone sodium phosphate (CELESTONE) injection 12 mg  . cyclobenzaprine (FLEXERIL) 10 MG tablet    Sig: Take 1 tablet (10 mg total) by mouth at bedtime.    Dispense:  30 tablet    Refill:  0     Laterality: Right  Location/Site:  L4-L5  Needle size: 20 G  Needle type: Tuohy  Needle Placement: Paramedian epidural  Findings:  -Contrast Used: 1 mL iohexol 180 mg iodine/mL   -Comments: Excellent flow of contrast into the epidural space.  Procedure Details: Using a paramedian approach from the side mentioned above, the region overlying the inferior lamina was localized under fluoroscopic visualization and the soft tissues overlying this structure were infiltrated with 4 ml. of 1% Lidocaine without  Epinephrine. The Tuohy needle was inserted into the epidural space using a paramedian approach.   The epidural space was localized using loss of resistance along with lateral and bi-planar fluoroscopic views.  After negative aspirate for air, blood, and CSF, a 2 ml. volume of Isovue-250 was injected into the epidural space and the flow of contrast was observed. Radiographs were obtained for documentation purposes.    The injectate was administered into the level noted above.   Additional Comments:  The patient tolerated the procedure well Dressing: Band-Aid    Post-procedure details: Patient was observed during the procedure. Post-procedure instructions were reviewed.  Patient left the clinic in stable condition.

## 2017-03-24 NOTE — ED Provider Notes (Signed)
[facility] EMERGENCY DEPARTMENT Provider Note   CSN: [id number] Arrival date & time: 03/24/17  0023  Time seen 02:20 AM   History   Chief Complaint Chief Complaint  Patient presents with  . Foot Pain    HPI [name] is a 41 y.o. female.  HPI patient has end-stage renal disease, she gets dialyzed on Monday, October 29, Wednesday, and Fridays at [facility] facility.  She has a fistula in her left wrist.  She states she woke up the morning of 1028 at the usual time and states she had some pain in her right foot and ankle that has now spread up into her thigh.  She states it is a throbbing pain.  She has had chills but has not checked for fever.  She had nausea and vomited twice around 7 PM.  She denies diarrhea.  She states about 2 nights ago her large dog which is a puppy stepped on her foot and she had a blood blister on her right toe that ruptured today.  Patient has had diabetes about 13 years and states her diabetes is very hard to control.  She states she normally has some numbness in her feet.  She states she is never had this pain in her leg before.  She states her leg has been red and swollen and felt hot all day.  PCP Merrilee Seashore, MD   Past Medical History:  Diagnosis Date  . Chronic anemia    2nd to renal disease  . CIN III (cervical intraepithelial neoplasia grade III) with severe dysplasia    S/P LEEP AND CONE  . Coronary artery disease    Status post cardiac catheterization June 2012 scattered coronary artery disease/atherosclerosis with 70-80% stenosis in a small right PDA.  . 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 DM (diabetes mellitus) ([place])    Long-term insulin  . End stage renal disease on dialysis ([place]) 05/02/11   [facility]; M; W, F; last time 05/01/11  . Fracture of 5th metatarsal 2016   Right  . Gastroparesis   . 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 History of abscesses in groins 12/06/2010  . Hyperlipidemia    Hypertriglyceridemia  449 HDL 25  . Hypertension   . Migraines    "just on dialysis days"  . Peripheral neuropathy    related to DM  . Peripheral vascular disease ([place])    Tibial occlusive disease evaluated by Dr. Kellie Simmering in August 2011. Medical therapy  . Renal insufficiency    Dialysis since 2012  . Tobacco use disorder    Discontinued March 2012  . Trimalleolar fracture of ankle, closed 02/09/2015   Right    Patient Active Problem List   Diagnosis Date Noted  . Acute on chronic diastolic CHF (congestive heart failure) ([place]) 02/22/2016  . Fluid overload   . Pericardial effusion   . Ascites 02/15/2016  . DM (diabetes mellitus), type 2 with renal complications ([place]) 51/76/1607  . Pain   . Bimalleolar ankle fracture 02/12/2015  . CAD (coronary artery disease) 02/09/2015  . Peripheral vascular disease ([place])   . Gastroparesis   . Chronic anemia   . Menorrhagia with regular cycle 01/04/2014  . Dysmenorrhea 01/04/2014  . Nausea & vomiting 10/16/2011  . History of noncompliance with medical treatment 05/09/2011  . Chronic UTI 05/09/2011  . CIN III (cervical intraepithelial neoplasia grade III) with severe dysplasia   . History of abscesses in groins 12/06/2010  . End stage renal  disease on dialysis ([place])   . Coronary artery disease   . Hyperlipidemia   . 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 NONDEPENDENT TOBACCO USE DISORDER 12/20/2009  . Essential hypertension, benign 12/20/2009  . NEPHROTIC SYNDROME 12/20/2009  . Shortness of breath 12/20/2009  . Chest pain 12/20/2009    Past Surgical History:  Procedure Laterality Date  . AV FISTULA PLACEMENT  04/2010  . CARDIAC CATHETERIZATION [name] 02/10/2015   Procedure: Left Heart Cath and Coronary Angiography;  Surgeon: Dixie Dials, MD;  Location: [facility];  Service: Cardiovascular;  Laterality: [name];  . CARDIAC CATHETERIZATION [name] 02/21/2016   Procedure: Right Heart Cath;  Surgeon: Jolaine Artist, MD;  Location: [facility];  Service:  Cardiovascular;  Laterality: [name];  . CERVICAL BIOPSY  W/ LOOP ELECTRODE EXCISION     h/o  . CERVICAL CONE BIOPSY     h/o  . COLPOSCOPY    . DILATION AND CURETTAGE OF UTERUS  2009  . ORIF ANKLE FRACTURE Right 02/09/2015  . ORIF ANKLE FRACTURE Right 02/16/2015   Procedure: OPEN REDUCTION INTERNAL FIXATION (ORIF) RIGHT ANKLE FRACTURE;  Surgeon: Altamese Ward, MD;  Location: [facility];  Service: Orthopedics;  Laterality: Right;    OB History    Gravida Para Term Preterm AB Living   2 1 1   1 1    SAB TAB Ectopic Multiple Live Births                   Home Medications    Prior to Admission medications   Medication Sig Start Date End Date Taking? Authorizing Provider  amLODipine (NORVASC) 10 MG tablet Take 1 tablet (10 mg total) by mouth daily. 02/24/16   Orson Eva, MD  amoxicillin-clavulanate (AUGMENTIN) 500-125 MG tablet Take 1 tablet (500 mg total) by mouth daily. X 7 days, then on Mon-Wed-Fri only after dialysis 02/24/16   Tat, David, MD  atorvastatin (LIPITOR) 40 MG tablet Take 40 mg by mouth daily.    [provider]  cloNIDine (CATAPRES) 0.3 MG tablet Take 1 tablet (0.3 mg total) by mouth 3 (three) times daily. 11/01/14   Debbe Odea, MD  cyclobenzaprine (FLEXERIL) 10 MG tablet Take 1 tablet (10 mg total) by mouth at bedtime. 03/12/17   Magnus Sinning, MD  Ferric Citrate (AURYXIA PO) Take 2 tablets by mouth 3 (three) times daily with meals.    [provider]  HUMALOG MIX 50/50 KWIKPEN (50-50) 100 UNIT/ML Kwikpen Inject 26-28 Units into the skin 2 (two) times daily. 28 in the morning and 26 in the evening. 04/29/14   [provider]  hydrALAZINE (APRESOLINE) 100 MG tablet Take 1 tablet (100 mg total) by mouth every 8 (eight) hours. 02/24/16   Orson Eva, MD  HYDROcodone-acetaminophen (NORCO) 5-325 MG tablet Take 1 tablet by mouth every 6 (six) hours as needed for moderate pain. 02/02/17   Kirichenko, Tatyana, PA-C  isosorbide mononitrate (IMDUR) 120 MG 24 hr tablet  Take 1 tablet (120 mg total) by mouth daily. 02/25/16   Orson Eva, MD  labetalol (NORMODYNE) 300 MG tablet Take 1 tablet (300 mg total) by mouth 3 (three) times daily. 11/01/14   Debbe Odea, MD  RENVELA 800 MG tablet Take 4,800 mg by mouth 3 (three) times daily with meals.  04/17/13   [provider]  traMADol (ULTRAM) 50 MG tablet Take 1 tablet (50 mg total) by mouth every 6 (six) hours as needed for moderate pain (or Headache unrelieved by tylenol). 02/24/16  Orson Eva, MD    Family History Family History  Problem Relation Age of Onset  . Diabetes Mother   . Hypertension Mother   . Diabetes Father   . Hyperlipidemia Father   . Hypertension Father   . Diabetes Sister   . Stroke Maternal Grandmother   . Diabetes Sister   . Breast cancer Maternal Aunt        Age 59's    Social History Social History  Substance Use Topics  . Smoking status: Former Smoker    Packs/day: 0.30    Years: 10.00    Types: Cigarettes    Quit date: 08/05/2011  . Smokeless tobacco: Never Used     Comment: "quit smoking cigarettes 07/2010"  . Alcohol use No  on disability for ESRD   Allergies   Cephalexin and Sulfamethoxazole-trimethoprim   Review of Systems Review of Systems  All other systems reviewed and are negative.    Physical Exam Updated Vital Signs BP (!) 198/87 (BP Location: Right Arm)   Pulse 79   Temp 98.2 F (36.8 C) (Oral)   Resp 16   Ht 6\' 4"  (1.93 m)   Wt 93.4 kg (206 lb)   LMP 10/09/2015 Comment: pt on dialysis  SpO2 96%   BMI 25.08 kg/m   Vital signs normal except for hypertension   Physical Exam  Constitutional: She is oriented to person, place, and time. She appears well-developed and well-nourished.  Non-toxic appearance. She does not appear ill. She appears distressed.  HENT:  Head: Normocephalic and atraumatic.  Right Ear: External ear normal.  Left Ear: External ear normal.  Nose: Nose normal. No mucosal edema or rhinorrhea.  Mouth/Throat:  Mucous membranes are normal. No dental abscesses or uvula swelling.  Eyes: Conjunctivae and EOM are normal.  Neck: Normal range of motion and full passive range of motion without pain.  Cardiovascular: Normal rate.   Pulmonary/Chest: Effort normal. No respiratory distress. She has no rhonchi. She exhibits no crepitus.  Abdominal: Normal appearance.  Musculoskeletal: Normal range of motion. She exhibits edema and tenderness.  Moves all extremities well.  Patient has diffuse redness and swelling of her right lower leg and foot.  Her right middle toe has a avulsed the skin on the dorsal aspect.  She also has darkening of her little toenail on the right.  Her leg is warm to touch up to about the level of the knee.  She is tender diffusely in her thigh without obvious swelling.  There is no redness in the thigh area.  There are no cords felt.  Neurological: She is alert and oriented to person, place, and time. She has normal strength. No cranial nerve deficit.  Skin: Skin is warm, dry and intact. No rash noted. No erythema. No pallor.  Psychiatric: She has a normal mood and affect. Her speech is normal and behavior is normal. Her mood appears not anxious.  Nursing note and vitals reviewed.  Right foot        ED Treatments / Results  Labs (all labs ordered are listed, but only abnormal results are displayed) Results for orders placed or performed during the hospital encounter of 03/24/17  CBC with Differential  Result Value Ref Range   WBC 11.2 (H) 4.0 - 10.5 K/uL   RBC 3.73 (L) 3.87 - 5.11 MIL/uL   Hemoglobin 10.6 (L) 12.0 - 15.0 g/dL   HCT 34.2 (L) 36.0 - 46.0 %   MCV 91.7 78.0 - 100.0 fL   MCH 28.4  26.0 - 34.0 pg   MCHC 31.0 30.0 - 36.0 g/dL   RDW 17.9 (H) 11.5 - 15.5 %   Platelets 121 (L) 150 - 400 K/uL   Neutrophils Relative % 85 %   Neutro Abs 9.5 (H) 1.7 - 7.7 K/uL   Lymphocytes Relative 8 %   Lymphs Abs 0.9 0.7 - 4.0 K/uL   Monocytes Relative 6 %   Monocytes Absolute 0.6  0.1 - 1.0 K/uL   Eosinophils Relative 1 %   Eosinophils Absolute 0.1 0.0 - 0.7 K/uL   Basophils Relative 0 %   Basophils Absolute 0.0 0.0 - 0.1 K/uL  Comprehensive metabolic panel  Result Value Ref Range   Sodium 138 135 - 145 mmol/L   Potassium 4.8 3.5 - 5.1 mmol/L   Chloride 96 (L) 101 - 111 mmol/L   CO2 26 22 - 32 mmol/L   Glucose, Bld 152 (H) 65 - 99 mg/dL   BUN 58 (H) 6 - 20 mg/dL   Creatinine, Ser 7.38 (H) 0.44 - 1.00 mg/dL   Calcium 8.7 (L) 8.9 - 10.3 mg/dL   Total Protein 6.8 6.5 - 8.1 g/dL   Albumin 3.4 (L) 3.5 - 5.0 g/dL   AST 18 15 - 41 U/L   ALT 15 14 - 54 U/L   Alkaline Phosphatase 236 (H) 38 - 126 U/L   Total Bilirubin 1.0 0.3 - 1.2 mg/dL   GFR calc non Af Amer 6 (L) >60 mL/min   GFR calc Af Amer 7 (L) >60 mL/min   Anion gap 16 (H) 5 - 15  Uric acid  Result Value Ref Range   Uric Acid, Serum 6.5 2.3 - 6.6 mg/dL   Laboratory interpretation all normal except chronic renal failure, mild hyperglycemia, new leukocytosis, mild anemia    EKG  EKG Interpretation None       Radiology No results found.  Procedures Procedures (including critical care time)  Medications Ordered in ED Medications  oxyCODONE-acetaminophen (PERCOCET/ROXICET) 5-325 MG per tablet (not administered)  sodium chloride 0.9 % bolus 500 mL (not administered)  fentaNYL (SUBLIMAZE) injection 50 mcg (not administered)  ciprofloxacin (CIPRO) IVPB 400 mg (not administered)  clindamycin (CLEOCIN) IVPB 600 mg (not administered)  vancomycin (VANCOCIN) IVPB 1000 mg/200 mL premix (not administered)  ketorolac (TORADOL) 30 MG/ML injection 15 mg (not administered)  oxyCODONE-acetaminophen (PERCOCET/ROXICET) 5-325 MG per tablet 1 tablet (1 tablet Oral Given 03/24/17 0037)     Initial Impression / Assessment and Plan / ED Course  I have reviewed the triage vital signs and the nursing notes.  Pertinent labs & imaging results that were available during my care of the patient were reviewed by me  and considered in my medical decision making (see chart for details).     Patient was started on IV pain medication, she was started on IV antibiotics I will talk to the hospitalist about admission. Pt has cellulitis of her RLE, she is immune compromised from her diabetes and ESRD.   03:52 AM Dr Myna Hidalgo, will admit  Final Clinical Impressions(s) / ED Diagnoses   Final diagnoses:  Cellulitis of right lower extremity    Plan admission  Rolland Porter, MD, Barbette Or, MD 03/24/17 [phone]

## 2017-03-24 NOTE — Progress Notes (Signed)
Initial Nutrition Assessment  DOCUMENTATION CODES:   Non-severe (moderate) malnutrition in context of chronic illness  INTERVENTION:   Nepro Shake po BID, each supplement provides 425 kcal and 19 grams protein  Pro-Stat 30 mL BID   NUTRITION DIAGNOSIS:   Moderate Malnutrition related to chronic illness (ESRD on HD, poorly controlled DM with complications including gastroparesis, wounds) as evidenced by mild fat depletion, moderate fat depletion, mild muscle depletion, moderate muscle depletion, edema.  GOAL:   Patient will meet greater than or equal to 90% of their needs  MONITOR:   PO intake, Supplement acceptance, Labs, Weight trends  REASON FOR ASSESSMENT:   Consult Wound healing  ASSESSMENT:   41 yo female admitted with RLE cellulitis; pt with abdominal ascites that requires paracentesis (scheduled for 10/30). Pt with hx of HTN, DM, CAD, ESRD on HD, PVD, gastroparesis, HLD  No recorded po intake; reports she ate good breakfast this AM Pt reports good appetite; not taking any supplements as outpatient  Pt reports dry weight of 89.5 kg; reports dry weight has been the same for a while   Labs:  reviewed Meds: ss novolog, levemir, renvela, ayurxia, sensipar, hectoral  NUTRITION - FOCUSED PHYSICAL EXAM:    Most Recent Value  Orbital Region  Mild depletion  Upper Arm Region  Moderate depletion  Thoracic and Lumbar Region  Mild depletion  Buccal Region  Mild depletion  Temple Region  Mild depletion  Clavicle Bone Region  Moderate depletion  Clavicle and Acromion Bone Region  Moderate depletion  Scapular Bone Region  Moderate depletion  Dorsal Hand  No depletion  Patellar Region  Mild depletion  Anterior Thigh Region  Mild depletion  Posterior Calf Region  No depletion  Edema (RD Assessment)  Moderate       Diet Order:  Diet renal with fluid restriction Fluid restriction: 1200 mL Fluid; Room service appropriate? Yes; Fluid consistency: Thin  EDUCATION  NEEDS:   Education needs have been addressed  Skin:  Skin Assessment: Reviewed RN Assessment  Last BM:  10/28  Height:   Ht Readings from Last 1 Encounters:  03/24/17 6\' 4"  (1.93 m)    Weight:   Wt Readings from Last 1 Encounters:  03/24/17 206 lb (93.4 kg)    Ideal Body Weight:  81.8 kg  BMI:  Body mass index is 25.08 kg/m.  Estimated Nutritional Needs:   Kcal:  8588-5027 kcals  Protein:  122-140 g  Fluid:  1000 mL plus UOP  [employer] MS, RD, LDN, CNSC [phone] Pager  [phone] Weekend/On-Call Pager

## 2017-03-24 NOTE — ED Notes (Signed)
The pt is a dialysis pt fistula in her lt forearfm  She was dialyzed Friday.  She takes e3 antibiotics each week for a chronic condition

## 2017-03-24 NOTE — Consult Note (Signed)
[facility] Nurse wound consult note Reason for Consult: Lower extremity wounds  Wound type:nonhealing ulcerations mostly on the RLE, scabbed and dry,appears unroofed blister area on the right 3rd toe Discoloration of the toes, swelling and redness of the RLE xrays negative  History of DM, CAD, and ESRD on HD. Pressure Injury POA:NA Measurement: area 3rd toe, visualized in images taken earlier Wound bed:dry, pink partial thickness area  Drainage (amount, consistency, odor)  Periwound: Dressing procedure/placement/frequency: Would cover open wounds of the RLE with xeroform, for antibacterial and as nonadherent. Ortho at the bedside as well.  Change every other day.   Discussed POC with patient and bedside nurse.  Re consult if needed, will not follow at this time. Thanks  Danny Zimny [employer], RN,CWOCN, CNS, [place] [phone])

## 2017-03-24 NOTE — ED Notes (Signed)
Pt to xray. Floor requested pt go to xray before coming to the floor.

## 2017-03-24 NOTE — Progress Notes (Signed)
Inpatient Diabetes Program Recommendations  AACE/ADA: New Consensus Statement on Inpatient Glycemic Control (2015)  Target Ranges:  Prepandial:   less than 140 mg/dL      Peak postprandial:   less than 180 mg/dL (1-2 hours)      Critically ill patients:  140 - 180 mg/dL   Lab Results  Component Value Date   GLUCAP 82 03/24/2017   HGBA1C 9.5 (H) 03/24/2017    Review of Glycemic ControlResults for LANYLA, COSTELLO (MRN [id number]) as of 03/24/2017 10:42  Ref. Range 03/24/2017 08:51  Glucose-Capillary Latest Ref Range: 65 - 99 mg/dL 82   Diabetes history: Type 2 DM/ESRD Outpatient Diabetes medications: Humalog 50/50 28 units am and 26 units PM Current orders for Inpatient glycemic control:  Levemir 20 units bid, Novolog sensitive tid with meals  Inpatient Diabetes Program Recommendations:   Note blood sugar=82 mg/dL this AM.  Consider reducing Levemir to 20 units once daily and add Novolog meal coverage 3 units tid with meals (hold if patient eats less than 50%).   Thanks, Adah Perl, RN, BC-ADM Inpatient Diabetes Coordinator Pager [phone] (8a-5p)

## 2017-03-25 ENCOUNTER — Inpatient Hospital Stay (HOSPITAL_COMMUNITY): Payer: Medicare Other

## 2017-03-25 ENCOUNTER — Encounter (HOSPITAL_COMMUNITY): Payer: Self-pay

## 2017-03-25 ENCOUNTER — Ambulatory Visit (HOSPITAL_COMMUNITY)
Admission: RE | Admit: 2017-03-25 | Discharge: 2017-03-25 | Disposition: A | Discharge status: Home / Self Care | Payer: Medicare Other | Source: Ambulatory Visit | Attending: Nephrology | Admitting: Nephrology

## 2017-03-25 DIAGNOSIS — E11628 Type 2 diabetes mellitus with other skin complications: Secondary | ICD-10-CM

## 2017-03-25 DIAGNOSIS — Z992 Dependence on renal dialysis: Secondary | ICD-10-CM

## 2017-03-25 DIAGNOSIS — L98499 Non-pressure chronic ulcer of skin of other sites with unspecified severity: Secondary | ICD-10-CM

## 2017-03-25 DIAGNOSIS — N186 End stage renal disease: Secondary | ICD-10-CM

## 2017-03-25 LAB — RENAL FUNCTION PANEL
Albumin: 3 g/dL — ABNORMAL LOW (ref 3.5–5.0)
Anion gap: 18 — ABNORMAL HIGH (ref 5–15)
BUN: 78 mg/dL — ABNORMAL HIGH (ref 6–20)
CALCIUM: 8.3 mg/dL — AB (ref 8.9–10.3)
CHLORIDE: 95 mmol/L — AB (ref 101–111)
CO2: 23 mmol/L (ref 22–32)
CREATININE: 8.25 mg/dL — AB (ref 0.44–1.00)
GFR calc Af Amer: 6 mL/min — ABNORMAL LOW (ref >60)
GFR calc non Af Amer: 5 mL/min — ABNORMAL LOW (ref >60)
GLUCOSE: 189 mg/dL — AB (ref 65–99)
Phosphorus: 10.6 mg/dL — ABNORMAL HIGH (ref 2.5–4.6)
Potassium: 6.1 mmol/L — ABNORMAL HIGH (ref 3.5–5.1)
SODIUM: 136 mmol/L (ref 135–145)

## 2017-03-25 LAB — CBC
HEMATOCRIT: 33.1 % — AB (ref 36.0–46.0)
Hemoglobin: 10.3 g/dL — ABNORMAL LOW (ref 12.0–15.0)
MCH: 29 pg (ref 26.0–34.0)
MCHC: 31.1 g/dL (ref 30.0–36.0)
MCV: 93.2 fL (ref 78.0–100.0)
Platelets: 94 10*3/uL — ABNORMAL LOW (ref 150–400)
RBC: 3.55 MIL/uL — ABNORMAL LOW (ref 3.87–5.11)
RDW: 17.6 % — AB (ref 11.5–15.5)
WBC: 7.9 10*3/uL (ref 4.0–10.5)

## 2017-03-25 LAB — GLUCOSE, CAPILLARY
GLUCOSE-CAPILLARY: 270 mg/dL — AB (ref 65–99)
Glucose-Capillary: 199 mg/dL — ABNORMAL HIGH (ref 65–99)
Glucose-Capillary: 122 mg/dL — ABNORMAL HIGH (ref 65–99)

## 2017-03-25 MED ORDER — PROMETHAZINE HCL 25 MG/ML IJ SOLN
12.5000 mg | Freq: Once | INTRAMUSCULAR | Status: AC
  Administered 2017-03-25: 12.5 mg via INTRAVENOUS
  Filled 2017-03-25: qty 1

## 2017-03-25 MED ORDER — LIDOCAINE-PRILOCAINE 2.5-2.5 % EX CREA: 1.0000 "application " | TOPICAL_CREAM | CUTANEOUS | Status: DC | PRN

## 2017-03-25 MED ORDER — DOXYCYCLINE HYCLATE 100 MG PO TABS
100.0000 mg | ORAL_TABLET | Freq: Two times a day (BID) | ORAL | Status: DC
  Administered 2017-03-25 – 2017-03-29 (×8): 100 mg via ORAL
  Filled 2017-03-25 (×8): qty 1

## 2017-03-25 MED ORDER — HEPARIN SODIUM (PORCINE) 1000 UNIT/ML DIALYSIS: 20.0000 [IU]/kg | INTRAMUSCULAR | Status: DC | PRN

## 2017-03-25 MED ORDER — VANCOMYCIN HCL IN DEXTROSE 1-5 GM/200ML-% IV SOLN
1000.0000 mg | INTRAVENOUS | Status: AC
  Administered 2017-03-25: 1000 mg via INTRAVENOUS
  Filled 2017-03-25 (×2): qty 200

## 2017-03-25 MED ORDER — PENTAFLUOROPROP-TETRAFLUOROETH EX AERO: 1.0000 "application " | INHALATION_SPRAY | CUTANEOUS | Status: DC | PRN

## 2017-03-25 MED ORDER — SODIUM CHLORIDE 0.9 % IV SOLN: 100.0000 mL | INTRAVENOUS | Status: DC | PRN

## 2017-03-25 MED ORDER — HEPARIN SODIUM (PORCINE) 1000 UNIT/ML DIALYSIS: 1000.0000 [IU] | INTRAMUSCULAR | Status: DC | PRN

## 2017-03-25 MED ORDER — LIDOCAINE HCL (PF) 1 % IJ SOLN: 5.0000 mL | INTRAMUSCULAR | Status: DC | PRN

## 2017-03-25 NOTE — Progress Notes (Signed)
PROGRESS NOTE    IONNA AVIS  FAO:[id number] DOB: [birth year] DOA: 03/24/2017 PCP: Merrilee Seashore, MD  Outpatient Specialists: Nephrology ([facility]),Transplant [facility]), Cardiology [facility]), Pulmonology [facility])    Brief Narrative:  Patient is a 41 y.o. female with PMHx significant for HTN, insulin dependent diabetes, CAD, anemia of chronic disease, and ESRD on hemodialysis, presented for right lower leg pain, swelling, and redness.  In the ED she was found to have nonhealing ulcers in toes of both feet, WBC 11.4, A1c 9.5.  Patient was started on broad spectrum IV antibiotics and admitted for further workup of suspected right leg cellulitis.  Foot XR (10/29) showed right proximal first phalanx fractures, multiple old fractures.  MRI pending.  Blood cultures (10/29) pending.  Vascular consulted, ABI (10/29) report pending.   Assessment & Plan:   Principal Problem:   Cellulitis in diabetic foot ([place]) Active Problems:   End stage renal disease on dialysis ([place])   Coronary artery disease   DM (diabetes mellitus), type 2 with renal complications (HCC)   Hypertension   Hypertensive urgency   Cellulitis   Chronic pain   Diabetic ulcer of lower leg (HCC)   Gangrene of toe of right foot (HCC)   Cellulitis of lower extremity in a diabetic patient -Blood cultures pending -ABI pending -Leukocytosis improved, 7.9 today -Continue IV antibiotics as recommended by pharmacy -Vascular consult placed  ESRD on dialysis -Nephrology following -BUN 78, Cr 8.25 this morning -Receiving hemodialysis today  Insulin dependent diabetes -Continue Levemir BID  -Monitor CBG (199 today)  Diabetic foot ulcer -Continue to clean/change wound every other day as recommended by WOC  Hypertension -Continue home labetalol, clonidine, and hydralazine  Anemia, thrombocytopenia -Hgb 10.3, plt 94 today.  No signs of active bleeding -Continue iron supplementation, monitor  CAD -Continue home statin,  beta-blocker, nitrates   DVT prophylaxis: Heparin Code Status: Full Family Communication: spoke with patient Disposition Plan: TBD - likely home with home health   Consultants:   Nephrology (Dr. Jonnie Finner)  [facility] [place])  Orthopedics (Dr. Sharol Given)  ID (Dr. Baxter Flattery)  Procedures:   Hemodialysis (10/29, 10/30)  Antimicrobials:   IV Azreonam (10/29)  IV Clindamycin (10/29)  IV Ceftriaxone (10/29 - )  PO Metronidazole (10/29 - )  IV Vancomycin (10/29 - )   Subjective: Patient states her pain persists, but has improved since yesterday.  Tired today, receiving hemodialysis.  Denies chest pain, SOB, nausea/vomiting.    Objective: Vitals:   03/25/17 0930 03/25/17 1000 03/25/17 1030 03/25/17 1045  BP: (!) 198/92 (!) 209/90 (!) 200/94 (!) 199/86  Pulse: 65 65 70 68  Resp:      Temp:      TempSrc:      SpO2:      Weight:      Height:        Intake/Output Summary (Last 24 hours) at 03/25/17 1106 Last data filed at 03/25/17 1000  Gross per 24 hour  Intake                0 ml  Output                0 ml  Net                0 ml   Filed Weights   03/24/17 0032 03/24/17 2049 03/25/17 0755  Weight: 93.4 kg (206 lb) 93.5 kg (206 lb 3.2 oz) 102 kg (224 lb 13.9 oz)    Examination:  General exam: Appears calm  and comfortable in hospital bed.  No acute distress Respiratory system: Clear to auscultation. Respiratory effort normal. Cardiovascular system: S1 & S2 heard, RRR. No JVD, murmurs, rubs, gallops or clicks. No pedal edema. Gastrointestinal system: Abdomen is distended.  + bowel sounds.   Central nervous system: Alert and oriented. No focal neurological deficits. Extremities: Symmetric 5 x 5 power.  Left forearm fistula intact Skin: Multiple superficial lesions on shins bilaterally.  Right leg is diffusely erythematous. Psychiatry: Judgement and insight appear normal. Pleasant affect, tired.     Data Reviewed: I have personally reviewed following labs  and imaging studies  CBC:  Recent Labs Lab 03/24/17 0032 03/24/17 0409 03/25/17 0221  WBC 11.2* 11.4* 7.9  NEUTROABS 9.5* 9.1*  --   HGB 10.6* 10.5* 10.3*  HCT 34.2* 32.2* 33.1*  MCV 91.7 92.0 93.2  PLT 121* 109* 94*   Basic Metabolic Panel:  Recent Labs Lab 03/24/17 0032 03/24/17 0409 03/25/17 0800  NA 138 137 136  K 4.8 4.9 6.1*  CL 96* 98* 95*  CO2 26 22 23   GLUCOSE 152* 85 189*  BUN 58* 60* 78*  CREATININE 7.38* 7.49* 8.25*  CALCIUM 8.7* 8.5* 8.3*  PHOS  --   --  10.6*   GFR: Estimated Creatinine Clearance: 12.8 mL/min (A) (by C-G formula based on SCr of 8.25 mg/dL (H)). Liver Function Tests:  Recent Labs Lab 03/24/17 0032 03/25/17 0800  AST 18  --   ALT 15  --   ALKPHOS 236*  --   BILITOT 1.0  --   PROT 6.8  --   ALBUMIN 3.4* 3.0*   No results for input(s): LIPASE, AMYLASE in the last 168 hours. No results for input(s): AMMONIA in the last 168 hours. Coagulation Profile: No results for input(s): INR, PROTIME in the last 168 hours. Cardiac Enzymes: No results for input(s): CKTOTAL, CKMB, CKMBINDEX, TROPONINI in the last 168 hours. BNP (last 3 results) No results for input(s): PROBNP in the last 8760 hours. HbA1C:  Recent Labs  03/24/17 0409  HGBA1C 9.5*   CBG:  Recent Labs Lab 03/24/17 0851 03/24/17 1214 03/24/17 1706 03/24/17 2158  GLUCAP 82 162* 252* 222*   Lipid Profile: No results for input(s): CHOL, HDL, LDLCALC, TRIG, CHOLHDL, LDLDIRECT in the last 72 hours. Thyroid Function Tests: No results for input(s): TSH, T4TOTAL, FREET4, T3FREE, THYROIDAB in the last 72 hours. Anemia Panel: No results for input(s): VITAMINB12, FOLATE, FERRITIN, TIBC, IRON, RETICCTPCT in the last 72 hours. Urine analysis:    Component Value Date/Time   COLORURINE YELLOW 02/02/2017 1123   APPEARANCEUR CLOUDY (A) 02/02/2017 1123   LABSPEC 1.008 02/02/2017 1123   PHURINE 9.0 (H) 02/02/2017 1123   GLUCOSEU >=500 (A) 02/02/2017 1123   HGBUR NEGATIVE  02/02/2017 1123   BILIRUBINUR NEGATIVE 02/02/2017 1123   KETONESUR NEGATIVE 02/02/2017 1123   PROTEINUR >=300 (A) 02/02/2017 1123   UROBILINOGEN 0.2 02/08/2011 1812   NITRITE NEGATIVE 02/02/2017 1123   LEUKOCYTESUR NEGATIVE 02/02/2017 1123   Sepsis Labs: @LABRCNTIP (procalcitonin:4,lacticidven:4)  ) Recent Results (from the past 240 hour(s))  Blood Cultures x 2 sites     Status: None (Preliminary result)   Collection Time: 03/24/17  4:20 AM  Result Value Ref Range Status   Specimen Description BLOOD RIGHT HAND  Final   Special Requests   Final    BOTTLES DRAWN AEROBIC AND ANAEROBIC Blood Culture adequate volume   Culture PENDING  Incomplete   Report Status PENDING  Incomplete  Blood Cultures x 2 sites  Status: None (Preliminary result)   Collection Time: 03/24/17  4:40 AM  Result Value Ref Range Status   Specimen Description BLOOD RIGHT FOREARM  Final   Special Requests   Final    BOTTLES DRAWN AEROBIC AND ANAEROBIC Blood Culture adequate volume   Culture PENDING  Incomplete   Report Status PENDING  Incomplete  MRSA PCR Screening     Status: None   Collection Time: 03/24/17  6:51 AM  Result Value Ref Range Status   MRSA by PCR NEGATIVE NEGATIVE Final    Comment:        The GeneXpert MRSA Assay (FDA approved for NASAL specimens only), is one component of a comprehensive MRSA colonization surveillance program. It is not intended to diagnose MRSA infection nor to guide or monitor treatment for MRSA infections.          Radiology Studies: Dg Foot 2 Views Left  Result Date: 03/24/2017 CLINICAL DATA:  Nonhealing toe ulcers. RIGHT leg swelling. History of diabetes, Charcot joint. EXAM: LEFT FOOT - 2 VIEW; RIGHT FOOT - 2 VIEW COMPARISON:  RIGHT foot radiograph February 13, 2015 and RIGHT ankle radiograph April 21, 2016. LEFT ankle radiograph February 02, 2017. FINDINGS: RIGHT foot: Acute to subacute corner fractures first proximal phalanx with intra-articular  extension. No dislocation. Age indeterminate distal second metatarsal fracture. Unchanged intraosseous cyst first proximal phalanx. Old healed fifth metatarsus fracture. Osteopenia. Soft tissue swellings. Small plantar calcaneal spur. Severe vascular calcifications. Bandage overlying third digit with soft tissue swelling. No subcutaneous gas or radiopaque foreign bodies. Ankle hardware. LEFT foot: No acute fracture deformity or dislocation. Old second and third proximal phalanx fractures. Old fifth metatarsus fracture. Osteopenia. Potential cyst first proximal phalanx. Small plantar calcaneal spur. Chronic offset of tarsal metatarsal joint space on lateral radiograph. Severe vascular calcifications. No subcutaneous gas or radiopaque foreign bodies. IMPRESSION: RIGHT foot: 1. Acute to subacute proximal first phalanx fractures. Age indeterminate second distal metatarsus fracture.Multiple old fractures. 2. Bandage over third toe, no radiographic findings of osteomyelitis though MRI is more sensitive. LEFT foot: 1. No acute fracture deformity or dislocation. Multiple old fractures. 2. No radiographic findings of osteomyelitis though, MRI is more sensitive. Electronically Signed   By: Elon Alas M.D.   On: 03/24/2017 05:27   Dg Foot 2 Views Right  Result Date: 03/24/2017 CLINICAL DATA:  Nonhealing toe ulcers. RIGHT leg swelling. History of diabetes, Charcot joint. EXAM: LEFT FOOT - 2 VIEW; RIGHT FOOT - 2 VIEW COMPARISON:  RIGHT foot radiograph February 13, 2015 and RIGHT ankle radiograph April 21, 2016. LEFT ankle radiograph February 02, 2017. FINDINGS: RIGHT foot: Acute to subacute corner fractures first proximal phalanx with intra-articular extension. No dislocation. Age indeterminate distal second metatarsal fracture. Unchanged intraosseous cyst first proximal phalanx. Old healed fifth metatarsus fracture. Osteopenia. Soft tissue swellings. Small plantar calcaneal spur. Severe vascular calcifications.  Bandage overlying third digit with soft tissue swelling. No subcutaneous gas or radiopaque foreign bodies. Ankle hardware. LEFT foot: No acute fracture deformity or dislocation. Old second and third proximal phalanx fractures. Old fifth metatarsus fracture. Osteopenia. Potential cyst first proximal phalanx. Small plantar calcaneal spur. Chronic offset of tarsal metatarsal joint space on lateral radiograph. Severe vascular calcifications. No subcutaneous gas or radiopaque foreign bodies. IMPRESSION: RIGHT foot: 1. Acute to subacute proximal first phalanx fractures. Age indeterminate second distal metatarsus fracture.Multiple old fractures. 2. Bandage over third toe, no radiographic findings of osteomyelitis though MRI is more sensitive. LEFT foot: 1. No acute fracture deformity or dislocation. Multiple old  fractures. 2. No radiographic findings of osteomyelitis though, MRI is more sensitive. Electronically Signed   By: Elon Alas M.D.   On: 03/24/2017 05:27        Scheduled Meds: . amLODipine  10 mg Oral Daily  . atorvastatin  40 mg Oral q1800  . cinacalcet  60 mg Oral Q supper  . cloNIDine  0.3 mg Oral TID  . cyclobenzaprine  10 mg Oral QHS  . [START ON 03/26/2017] darbepoetin (ARANESP) injection - DIALYSIS  200 mcg Intravenous Q Wed-HD  . doxercalciferol  7 mcg Intravenous Q M,W,F-HD  . feeding supplement (NEPRO CARB STEADY)  237 mL Oral BID BM  . feeding supplement (PRO-STAT SUGAR FREE 64)  30 mL Oral BID  . ferric citrate  630 mg Oral TID WC  . heparin  5,000 Units Subcutaneous Q8H  . hydrALAZINE  100 mg Oral Q8H  . insulin aspart  0-5 Units Subcutaneous QHS  . insulin aspart  0-9 Units Subcutaneous TID WC  . insulin detemir  20 Units Subcutaneous Daily  . isosorbide mononitrate  120 mg Oral Daily  . labetalol  300 mg Oral TID  . metroNIDAZOLE  500 mg Oral Q8H  . multivitamin  1 tablet Oral QHS  . nitroGLYCERIN  0.2 mg Transdermal Daily  . pentoxifylline  400 mg Oral TID WC  .  sevelamer carbonate  4,800 mg Oral TID WC  . sodium chloride flush  3 mL Intravenous Q12H   Continuous Infusions: . sodium chloride 250 mL (03/24/17 0409)  . sodium chloride    . sodium chloride    . cefTRIAXone (ROCEPHIN)  IV 2 g (03/24/17 1807)  . vancomycin    . vancomycin       LOS: 1 day      Tanzania Hall-Potvin, PA-S [facility] Pager [phone]  If 7PM-7AM, please contact night-coverage [email or web address] Password [email or web address] 03/25/2017, 11:06 AM

## 2017-03-25 NOTE — Progress Notes (Signed)
Informed the patient that all Jewelry must be removed for the MRI patient stated that her ears are newly pierced and that she cannot remove them. Informed MRI of patient statement was told that day shift will address this issue. Arthor Captain LPN

## 2017-03-25 NOTE — H&P (View-Only) (Signed)
Hospital Consult    Reason for Consult:  Non healing wound bilateral toes Requesting Physician:  Carolin Sicks  MRN #:  [id number]  History of Present Illness: This is a 41 y.o. female who states that she had a blood blister presenton the 4th toe on both feet.  On Saturday evening, her dog, Rambo and 50lb bloodhound mix stepped in her right foot and tore the skin off of the 4th toe.   She states that she developed pain in her right foot that ascended up her leg the next day.   She states that she has had wounds in the past, but no trouble with them healing.  She does have neuropathy of her feet.  She does have scabs present as well.   She presented to the ED yesterday with increasing redness, leg pain and swelling.   She did have a mild leukocytosis, but was afebrile.    Dr. Sharol Given was consulted and recommended a vascular consult.  She was started on a NTG patch and Trental to improve the microcirculation in her foot.  He is uncertain if she can heal a BKA.  Pt was also seen by infectious disease and was placed on broad spectrum abx.  MRI is pending.  She is ESRD and dialyzes M/W/F at the [facility] in [place].  She dialyzes via a left RC AVF.  She states it has had revisions in the past.  It was originally created at [facility].  Her K+ today is 6.1 and will go to HD tomorrow per nephrology.    She does have thrombocytopenia with a platelet count on admission of 121k and is 94k today.  The pt is on a statin for cholesterol management.  She is on a CCB, beta blocker and catapres for blood pressure management.  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  She denies hx of MI, chest pain, CVA.  She states that she had an ultrasound of her neck a couple of years ago. She quit smoking 2.5 years ago.  Past Medical History:  Diagnosis Date  . Chronic anemia    2nd to renal disease  . CIN III (cervical intraepithelial neoplasia grade III) with severe dysplasia    S/P LEEP AND CONE  . Coronary artery disease    Status post  cardiac catheterization June 2012 scattered coronary artery disease/atherosclerosis with 70-80% stenosis in a small right PDA.  . Diabetic Charcot's joint disease ([place])   . DM (diabetes mellitus) ([place])    Long-term insulin  . End stage renal disease on dialysis ([place]) 05/02/11   [facility]; M; W, F; last time 05/01/11  . Fracture of 5th metatarsal 2016   Right  . Gastroparesis   . 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 History of abscesses in groins 12/06/2010  . Hyperlipidemia    Hypertriglyceridemia 449 HDL 25  . Hypertension   . Migraines    "just on dialysis days"  . Peripheral neuropathy    related to DM  . Peripheral vascular disease ([place])    Tibial occlusive disease evaluated by Dr. Kellie Simmering in August 2011. Medical therapy  . Renal insufficiency    Dialysis since 2012  . Tobacco use disorder    Discontinued March 2012  . Trimalleolar fracture of ankle, closed 02/09/2015   Right    Past Surgical History:  Procedure Laterality Date  . AV FISTULA PLACEMENT  04/2010  . CARDIAC CATHETERIZATION [name] 02/10/2015   Procedure: Left Heart Cath and Coronary Angiography;  Surgeon: Dixie Dials, MD;  Location: [facility];  Service: Cardiovascular;  Laterality: [name];  . CARDIAC CATHETERIZATION [name] 02/21/2016   Procedure: Right Heart Cath;  Surgeon: Jolaine Artist, MD;  Location: [place] [facility];  Service: Cardiovascular;  Laterality: [name];  . CERVICAL BIOPSY  W/ LOOP ELECTRODE EXCISION     h/o  . CERVICAL CONE BIOPSY     h/o  . COLPOSCOPY    . DILATION AND CURETTAGE OF UTERUS  2009  . ORIF ANKLE FRACTURE Right 02/09/2015  . ORIF ANKLE FRACTURE Right 02/16/2015   Procedure: OPEN REDUCTION INTERNAL FIXATION (ORIF) RIGHT ANKLE FRACTURE;  Surgeon: Altamese Stewart, MD;  Location: [facility];  Service: Orthopedics;  Laterality: Right;    Allergies  Allergen Reactions  . Cephalexin Other (See Comments)    Reaction unknown- Childhood allergy Tolerated Ceftriaxone in the past  .  Sulfamethoxazole-Trimethoprim Other (See Comments)    Unknown reaction. Pt states that she was told by her mother that she had allergy to Bactrim as a child.    Prior to Admission medications   Medication Sig Start Date End Date Taking? Authorizing Provider  amLODipine (NORVASC) 10 MG tablet Take 1 tablet (10 mg total) by mouth daily. 02/24/16  Yes Tat, Shanon Brow, MD  amoxicillin-clavulanate (AUGMENTIN) 500-125 MG tablet Take 1 tablet (500 mg total) by mouth daily. X 7 days, then on Mon-Wed-Fri only after dialysis 02/24/16  Yes Tat, David, MD  atorvastatin (LIPITOR) 40 MG tablet Take 40 mg by mouth daily.   Yes [provider]  cloNIDine (CATAPRES) 0.3 MG tablet Take 1 tablet (0.3 mg total) by mouth 3 (three) times daily. 11/01/14  Yes Debbe Odea, MD  cyclobenzaprine (FLEXERIL) 10 MG tablet Take 1 tablet (10 mg total) by mouth at bedtime. Patient taking differently: Take 10 mg by mouth at bedtime as needed for muscle spasms.  03/12/17  Yes Magnus Sinning, MD  Ferric Citrate (AURYXIA PO) Take 2 tablets by mouth 3 (three) times daily with meals.   Yes [provider]  HUMALOG MIX 50/50 KWIKPEN (50-50) 100 UNIT/ML Kwikpen Inject 26 Units into the skin 2 (two) times daily.  04/29/14  Yes [provider]  hydrALAZINE (APRESOLINE) 100 MG tablet Take 1 tablet (100 mg total) by mouth every 8 (eight) hours. 02/24/16  Yes Tat, Shanon Brow, MD  HYDROcodone-acetaminophen (NORCO) 5-325 MG tablet Take 1 tablet by mouth every 6 (six) hours as needed for moderate pain. 02/02/17  Yes Kirichenko, Tatyana, PA-C  insulin lispro (HUMALOG) 100 UNIT/ML injection Inject 6 Units into the skin 3 (three) times daily before meals.   Yes [provider]  isosorbide mononitrate (IMDUR) 120 MG 24 hr tablet Take 1 tablet (120 mg total) by mouth daily. 02/25/16  Yes Tat, Shanon Brow, MD  labetalol (NORMODYNE) 300 MG tablet Take 1 tablet (300 mg total) by mouth 3 (three) times daily. 11/01/14  Yes Debbe Odea, MD    RENVELA 800 MG tablet Take 4,800 mg by mouth 3 (three) times daily with meals.  04/17/13  Yes [provider]  traMADol (ULTRAM) 50 MG tablet Take 1 tablet (50 mg total) by mouth every 6 (six) hours as needed for moderate pain (or Headache unrelieved by tylenol). 02/24/16  Deveron Furlong, MD    Social History   Social History  . Marital status: Married    Spouse name: [name]  . Number of children: [name]  . Years of education: [name]   Occupational History  .  [employer]   Social History Main Topics  .  Smoking status: Former Smoker    Packs/day: 0.30    Years: 10.00    Types: Cigarettes    Quit date: 08/05/2011  . Smokeless tobacco: Never Used     Comment: "quit smoking cigarettes 07/2010"  . Alcohol use No  . Drug use: No  . Sexual activity: Not on file   Other Topics Concern  . Not on file   Social History Narrative  . No narrative on file     Family History  Problem Relation Age of Onset  . Diabetes Mother   . Hypertension Mother   . Diabetes Father   . Hyperlipidemia Father   . Hypertension Father   . Diabetes Sister   . Stroke Maternal Grandmother   . Diabetes Sister   . Breast cancer Maternal Aunt        Age 71's    ROS: [x]  Positive   [ ]  Negative   [ ]  All sytems reviewed and are negative  Cardiac: []  chest pain/pressure []  palpitations []  SOB lying flat []  DOE  Vascular: []  pain in legs while walking []  pain in legs at rest []  pain in legs at night []  non-healing ulcers []  hx of DVT []  swelling in legs  Pulmonary: []  productive cough []  asthma/wheezing []  home O2  Neurologic: []  weakness in []  arms []  legs []  numbness in []  arms []  legs []  hx of CVA []  mini stroke [] difficulty speaking or slurred speech []  temporary loss of vision in one eye []  dizziness [x]  hx migraines  Hematologic: []  hx of cancer []  bleeding problems []  problems with blood clotting easily [x]  anemia of chronic disease [x]  thrombocytopenia  Endocrine:    [x]  diabetes []  thyroid disease  GI []  vomiting blood []  blood in stool  GU: [x]  CKD/renal failure [x]  HD--[x]  M/W/F or []  T/T/S []  burning with urination []  blood in urine  Psychiatric: []  anxiety []  depression  Musculoskeletal: []  arthritis []  joint pain  Integumentary: []  rashes []  ulcers  Constitutional: []  fever []  chills   Physical Examination  Vitals:   03/25/17 1101 03/25/17 1148  BP: (!) 183/78 (!) 201/90  Pulse: 70 70  Resp: 16 17  Temp: (!) 97.4 F (36.3 C) 98.2 F (36.8 C)  SpO2: 94% 95%   Body mass index is 26.14 kg/m.    General:  WDWN in NAD Gait: Not observed HENT: WNL, normocephalic Pulmonary: normal non-labored breathing, without Rales, rhonchi,  wheezing Cardiac: regular, without  Murmurs, rubs or gallops; with left carotid bruit Abdomen:  soft, NT/ND, no masses Skin: without rashes Vascular Exam/Pulses:  Right Left  Radial 2+ (normal) 2+ (normal)  Femoral 2+ (normal) 2+ (normal)  Popliteal Unable to palpate  Unable to palpate   DP Unable to palpate  Unable to palpate   PT Unable to palpate  Unable to palpate    Extremities: left foot warmer than right;   Right foot.  Left foot    Musculoskeletal: no muscle wasting or atrophy  Neurologic: A&O X 3;  No focal weakness or paresthesias are detected; speech is fluent/normal Psychiatric:  The pt has Normal affect. Lymph:  No inguinal lymphadenopathy   CBC    Component Value Date/Time   WBC 7.9 03/25/2017 0221   RBC 3.55 (L) 03/25/2017 0221   HGB 10.3 (L) 03/25/2017 0221   HCT 33.1 (L) 03/25/2017 0221   PLT 94 (L) 03/25/2017 0221   MCV 93.2 03/25/2017 0221   MCH 29.0 03/25/2017 0221   MCHC 31.1 03/25/2017 0221  RDW 17.6 (H) 03/25/2017 0221   LYMPHSABS 1.4 03/24/2017 0409   MONOABS 0.7 03/24/2017 0409   EOSABS 0.1 03/24/2017 0409   BASOSABS 0.0 03/24/2017 0409    BMET    Component Value Date/Time   NA 136 03/25/2017 0800   K 6.1 (H) 03/25/2017 0800   CL 95  (L) 03/25/2017 0800   CO2 23 03/25/2017 0800   GLUCOSE 189 (H) 03/25/2017 0800   BUN 78 (H) 03/25/2017 0800   CREATININE 8.25 (H) 03/25/2017 0800   CALCIUM 8.3 (L) 03/25/2017 0800   GFRNONAA 5 (L) 03/25/2017 0800   GFRAA 6 (L) 03/25/2017 0800    COAGS: Lab Results  Component Value Date   INR 1.24 02/15/2016   INR 1.14 02/15/2015   INR 1.09 02/09/2015     Non-Invasive Vascular Imaging:   ABI'S 03/24/17: Final Interpretation: Right: Resting right ankle-brachial index indicates noncompressible right lower extremity arteries.The right toe-brachial index is abnormal. Left: Resting left ankle-brachial index indicates noncompressible right lower extremity arteries.The left toe-brachial index is normal.  Statin:  Yes.   Beta Blocker:  Yes.   Aspirin:  No. ACEI:  No. ARB:  No. CCB use:  Yes Other antiplatelets/anticoagulants:  Yes.   SQ heparin   ASSESSMENT/PLAN: This is a 41 y.o. female with ESRD and IDDM with non healing wounds bilateral feet.   -pt ABI's reveal non compressible vessels bilaterally.  She does have non healing wounds bilateral feet (see above pictures) the left foot is warmer than the right.  I cannot palpate pulses and there is no doppler available.  She will need arteriogram to evaluate arterial anatomy with possible intervention.  Of note, she does have thrombocytopenia with platelet count today of 94k. -antibiotics per infectious disease -MRI pending -she does have a right carotid bruit-will order carotid duplex. -Dr. Trula Slade to see pt this afternoon.   [name], PA-C [facility] [phone] I agree with the above.  I have seen and evaluated the patient.  She has bilateral nonhealing wounds with diabetes.  She does not have palpable pedal pulses.  In order to attempt limb salvage, she will need arteriogram to determine if she has any options for revascularization.  The right leg appears to be bothering her the worst.  Therefore I  would plan on aortogram with bilateral runoff and possible intervention of the right leg if feasible.  She is on dialysis.  This will be scheduled for Thursday.  [name]

## 2017-03-25 NOTE — Progress Notes (Signed)
Orthopedic Tech Progress Note Patient Details:  NARALY FRITCHER 02/13/1976 [id number]  Ortho Devices Type of Ortho Device: Postop shoe/boot Ortho Device/Splint Location: rle Ortho Device/Splint Interventions: Ordered, Application, Adjustment   Karolee Stamps 03/25/2017, 1:42 AM

## 2017-03-25 NOTE — Progress Notes (Signed)
[facility] Progress Note  Assessment/Plan: 1. 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 Antibiotics per primary/ID.  Vascular consult pending to see if candidate for revascularization. Blood cultures pending 2. ESRD -  MWF. HD pushed back to today d/t full dialysis schedule.  Mild hyperkalemia K 6.1, will correct with HD. Next HD 10/31 on schedule  3. Hypertension/volume  - BP elevated likely with volume/ UF 4-5L today, try to get weight down here  4. Anemia  - Hgb 10.5, Next ESA due 10/31  5. Metabolic bone disease -  Cont VDRA/binders  6. Nutrition - Renal diet/vitamins/prostat for low albumin  7. DM- per primary  8. Thrombocytopenia - mild ?d/t infection, hold heparin next HD  9. H/o ascites  - consider therapeutic paracentesis, if no improvement in volume status   Subjective: Seen on HD. UF goal 5L with 4.2L off so far. Tolerating well, BP still up. Foot pain improving, abdomen feels tight. Denies CP, SOB.   Objective Vitals:   03/25/17 0900 03/25/17 0930 03/25/17 1000 03/25/17 1030  BP: (!) 191/92 (!) 198/92 (!) 209/90 (!) 200/94  Pulse: 67 65 65 70  Resp:      Temp:      TempSrc:      SpO2:      Weight:      Height:       Physical Exam General: WNWD female NAD  Heart: RRR 2/6 SEM  Lungs: CTAB Abdomen: distended with fluid wave  Extremities: 1+ LE edema, bilat, R foot tender to palpation, cool, mild erythema, toes bandaged  Dialysis Access: LUE AVF cannulated on HD  Dialysis:  [facility] MWF 4h 67min   89kg   2/2   L AVF   Hep 2000 -Hectorol 60mcg IV tiw -Venofer 50mg  IV q Wed -Mircera 200 mcg IV q 2 weeks (last dosed 10/17) BMM: Auryixa 3 tabs qac, Renvela 6 qac, 2 q snacks, Sensipar 60 qd   Lynnda Child PA-C [facility] Pager [phone] 03/25/2017,10:37 AM  LOS: 1 day   Pt seen, examined and agree w A/P as above.  Kelly Splinter MD [facility] pager  [phone]   03/25/2017, 12:41 PM    Additional Objective Labs: Basic Metabolic Panel:  Recent Labs Lab 03/24/17 0032 03/24/17 0409 03/25/17 0800  NA 138 137 136  K 4.8 4.9 6.1*  CL 96* 98* 95*  CO2 26 22 23   GLUCOSE 152* 85 189*  BUN 58* 60* 78*  CREATININE 7.38* 7.49* 8.25*  CALCIUM 8.7* 8.5* 8.3*  PHOS  --   --  10.6*   Liver Function Tests:  Recent Labs Lab 03/24/17 0032 03/25/17 0800  AST 18  --   ALT 15  --   ALKPHOS 236*  --   BILITOT 1.0  --   PROT 6.8  --   ALBUMIN 3.4* 3.0*   No results for input(s): LIPASE, AMYLASE in the last 168 hours. CBC:  Recent Labs Lab 03/24/17 0032 03/24/17 0409 03/25/17 0221  WBC 11.2* 11.4* 7.9  NEUTROABS 9.5* 9.1*  --   HGB 10.6* 10.5* 10.3*  HCT 34.2* 32.2* 33.1*  MCV 91.7 92.0 93.2  PLT 121* 109* 94*   Blood Culture    Component Value Date/Time   SDES BLOOD RIGHT FOREARM 03/24/2017 0440   SPECREQUEST  03/24/2017 0440    BOTTLES DRAWN AEROBIC AND ANAEROBIC Blood Culture adequate volume   CULT PENDING 03/24/2017 0440   REPTSTATUS PENDING  03/24/2017 0440    Cardiac Enzymes: No results for input(s): CKTOTAL, CKMB, CKMBINDEX, TROPONINI in the last 168 hours. CBG:  Recent Labs Lab 03/24/17 0851 03/24/17 1214 03/24/17 1706 03/24/17 2158  GLUCAP 82 162* 252* 222*   Iron Studies: No results for input(s): IRON, TIBC, TRANSFERRIN, FERRITIN in the last 72 hours. Lab Results  Component Value Date   INR 1.24 02/15/2016   INR 1.14 02/15/2015   INR 1.09 02/09/2015   Medications: . sodium chloride 250 mL (03/24/17 0409)  . sodium chloride    . sodium chloride    . cefTRIAXone (ROCEPHIN)  IV 2 g (03/24/17 1807)  . vancomycin     . amLODipine  10 mg Oral Daily  . atorvastatin  40 mg Oral q1800  . cinacalcet  60 mg Oral Q supper  . cloNIDine  0.3 mg Oral TID  . cyclobenzaprine  10 mg Oral QHS  . [START ON 03/26/2017] darbepoetin (ARANESP) injection - DIALYSIS  200 mcg Intravenous Q Wed-HD  .  doxercalciferol  7 mcg Intravenous Q M,W,F-HD  . feeding supplement (NEPRO CARB STEADY)  237 mL Oral BID BM  . feeding supplement (PRO-STAT SUGAR FREE 64)  30 mL Oral BID  . ferric citrate  630 mg Oral TID WC  . heparin  5,000 Units Subcutaneous Q8H  . hydrALAZINE  100 mg Oral Q8H  . insulin aspart  0-5 Units Subcutaneous QHS  . insulin aspart  0-9 Units Subcutaneous TID WC  . insulin detemir  20 Units Subcutaneous Daily  . isosorbide mononitrate  120 mg Oral Daily  . labetalol  300 mg Oral TID  . metroNIDAZOLE  500 mg Oral Q8H  . multivitamin  1 tablet Oral QHS  . nitroGLYCERIN  0.2 mg Transdermal Daily  . pentoxifylline  400 mg Oral TID WC  . sevelamer carbonate  4,800 mg Oral TID WC  . sodium chloride flush  3 mL Intravenous Q12H

## 2017-03-25 NOTE — Progress Notes (Signed)
Pharmacy Antibiotic Note  [name] is a 41 y.o. female admitted on 03/24/2017 with diabetic wound infection.  Pharmacy has been consulted for Vancomycin dosing.   ESRD on HD MWF- was to go to HD yesterday, but was unable to and is in HD this morning (off schedule)- has tolerated ~3h so far with BFR 430mL/min.  Plan: -Vancomycin 1gIV x 1 now with HD (spoke with RN and asked him to hang prior to her leaving HD unit), then continue 1000mg  IV qHD- MWF -Ceftriaxone 2g IV q24h and Flagyl 500mg  IV q8h per MD -Follow c/s, clinical progression, surgical/interventional plans, HD schedule/tolerance, level PRN   Height: 6\' 4"  (193 cm) Weight: 224 lb 13.9 oz (102 kg) IBW/kg (Calculated) : 82.3  Temp (24hrs), Avg:97.8 F (36.6 C), Min:97.5 F (36.4 C), Max:98 F (36.7 C)   Recent Labs Lab 03/24/17 0032 03/24/17 0409 03/25/17 0221 03/25/17 0800  WBC 11.2* 11.4* 7.9  --   CREATININE 7.38* 7.49*  --  8.25*    Estimated Creatinine Clearance: 12.8 mL/min (A) (by C-G formula based on SCr of 8.25 mg/dL (H)).    Allergies  Allergen Reactions  . Cephalexin Other (See Comments)    Reaction unknown- Childhood allergy Tolerated Ceftriaxone in the past  . Sulfamethoxazole-Trimethoprim Other (See Comments)    Unknown reaction. Pt states that she was told by her mother that she had allergy to Bactrim as a child.   Vancomycin 10/29>>  *went to HD off schedule 10/30- getting 1g Aztreonam 10/29>>10/29 Flagyl 10/29>> Ceftriaxone 10/29>>  10/29 BCx: ngtd 10/29 MRSA PCR: neg  Kortez Murtagh D. Mayjor Ager, PharmD, BCPS Clinical Pharmacist Pager: [phone] Clinical Phone for 03/25/2017 until 3:30pm: [phone] If after 3:30pm, please call main pharmacy at [phone] 03/25/2017 10:52 AM

## 2017-03-25 NOTE — Care Management Note (Signed)
Case Management Note  Patient Details  Name: [name] MRN: [id number] Date of Birth: 1975/10/02  Subjective/Objective:                 Patient admitted from home for ESRD and IDDM with non healing wounds bilateral feet. Patient has HD MWF at [facility] Vascular evaluating, and currently receiving multiple IV Abx.    Action/Plan:  CM will continue to follow and assess for home needs and DME as treatment plan is determined.  Expected Discharge Date:                  Expected Discharge Plan:  [place]  In-House Referral:     Discharge planning Services  CM Consult  Post Acute Care Choice:    Choice offered to:     DME Arranged:    DME Agency:     HH Arranged:    [place] Agency:     Status of Service:  In process, will continue to follow  If discussed at Long Length of Stay Meetings, dates discussed:    Additional Comments:  Carles Collet, RN 03/25/2017, 1:54 PM

## 2017-03-25 NOTE — Progress Notes (Signed)
[place] for Infectious Disease    Date of Admission:  03/24/2017   Total days of antibiotics 3   ID: [name] is a 41 y.o. female with ESRD, HTN, PVD, with non healing wounds bilateral toes Principal Problem:   Cellulitis in diabetic foot ([place]) Active Problems:   End stage renal disease on dialysis ([place])   Coronary artery disease   DM (diabetes mellitus), type 2 with renal complications (HCC)   Hypertension   Hypertensive urgency   Cellulitis   Chronic pain   Diabetic ulcer of lower leg (HCC)   Gangrene of toe of right foot (HCC)   Non-healing ulcer (H[facility])    Subjective: no fevers, feeling better, seen by vascular service for evaluation of arteriogram  Medications:  . amLODipine  10 mg Oral Daily  . atorvastatin  40 mg Oral q1800  . cinacalcet  60 mg Oral Q supper  . cloNIDine  0.3 mg Oral TID  . cyclobenzaprine  10 mg Oral QHS  . [START ON 03/26/2017] darbepoetin (ARANESP) injection - DIALYSIS  200 mcg Intravenous Q Wed-HD  . doxercalciferol  7 mcg Intravenous Q M,W,F-HD  . feeding supplement (NEPRO CARB STEADY)  237 mL Oral BID BM  . feeding supplement (PRO-STAT SUGAR FREE 64)  30 mL Oral BID  . ferric citrate  630 mg Oral TID WC  . heparin  5,000 Units Subcutaneous Q8H  . hydrALAZINE  100 mg Oral Q8H  . insulin aspart  0-5 Units Subcutaneous QHS  . insulin aspart  0-9 Units Subcutaneous TID WC  . insulin detemir  20 Units Subcutaneous Daily  . isosorbide mononitrate  120 mg Oral Daily  . labetalol  300 mg Oral TID  . multivitamin  1 tablet Oral QHS  . nitroGLYCERIN  0.2 mg Transdermal Daily  . pentoxifylline  400 mg Oral TID WC  . sevelamer carbonate  4,800 mg Oral TID WC  . sodium chloride flush  3 mL Intravenous Q12H    Objective: Vital signs in last 24 hours: Temp:  [97.4 F (36.3 C)-98.2 F (36.8 C)] 97.9 F (36.6 C) (10/30 1713) Pulse Rate:  [59-70] 66 (10/30 1713) Resp:  [16-19] 17 (10/30 1713) BP: (119-209)/(71-104) 197/88 (10/30  1713) SpO2:  [93 %-97 %] 96 % (10/30 1713) Weight:  [206 lb 3.2 oz (93.5 kg)-224 lb 13.9 oz (102 kg)] 214 lb 11.7 oz (97.4 kg) (10/30 1101) Physical Exam  Constitutional:  oriented to person, place, and time. appears well-developed and well-nourished. No distress.  HENT: [place]/AT, PERRLA, no scleral icterus Mouth/Throat: Oropharynx is clear and moist. No oropharyngeal exudate.  Cardiovascular: Normal rate, regular rhythm and normal heart sounds. Exam reveals no gallop and no friction rub.  No murmur heard. Pulses absent but warm. Pulmonary/Chest: Effort normal and breath sounds normal. No respiratory distress.  has no wheezes.  Ext: feet wrapped, unchanged from photos in vascular consult, left upper extremity fistula Skin: Skin is warm and dry. hyperpigemented with  Eschar to 5th, 4th and toe on left, denuded right 3rd toe Psychiatric: a normal mood and affect.  behavior is normal.    Lab Results  Recent Labs  03/24/17 0409 03/25/17 0221 03/25/17 0800  WBC 11.4* 7.9  --   HGB 10.5* 10.3*  --   HCT 32.2* 33.1*  --   NA 137  --  136  K 4.9  --  6.1*  CL 98*  --  95*  CO2 22  --  23  BUN 60*  --  78*  CREATININE 7.49*  --  8.25*   Liver Panel  Recent Labs  03/24/17 0032 03/25/17 0800  PROT 6.8  --   ALBUMIN 3.4* 3.0*  AST 18  --   ALT 15  --   ALKPHOS 236*  --   BILITOT 1.0  --    Sedimentation Rate  Recent Labs  03/24/17 0526  ESRSEDRATE 25*   C-Reactive Protein  Recent Labs  03/24/17 0409  CRP 1.8*    Microbiology: Blood cx ngtd Studies/Results: Dg Foot 2 Views Left  Result Date: 03/24/2017 CLINICAL DATA:  Nonhealing toe ulcers. RIGHT leg swelling. History of diabetes, Charcot joint. EXAM: LEFT FOOT - 2 VIEW; RIGHT FOOT - 2 VIEW COMPARISON:  RIGHT foot radiograph February 13, 2015 and RIGHT ankle radiograph April 21, 2016. LEFT ankle radiograph February 02, 2017. FINDINGS: RIGHT foot: Acute to subacute corner fractures first proximal phalanx with  intra-articular extension. No dislocation. Age indeterminate distal second metatarsal fracture. Unchanged intraosseous cyst first proximal phalanx. Old healed fifth metatarsus fracture. Osteopenia. Soft tissue swellings. Small plantar calcaneal spur. Severe vascular calcifications. Bandage overlying third digit with soft tissue swelling. No subcutaneous gas or radiopaque foreign bodies. Ankle hardware. LEFT foot: No acute fracture deformity or dislocation. Old second and third proximal phalanx fractures. Old fifth metatarsus fracture. Osteopenia. Potential cyst first proximal phalanx. Small plantar calcaneal spur. Chronic offset of tarsal metatarsal joint space on lateral radiograph. Severe vascular calcifications. No subcutaneous gas or radiopaque foreign bodies. IMPRESSION: RIGHT foot: 1. Acute to subacute proximal first phalanx fractures. Age indeterminate second distal metatarsus fracture.Multiple old fractures. 2. Bandage over third toe, no radiographic findings of osteomyelitis though MRI is more sensitive. LEFT foot: 1. No acute fracture deformity or dislocation. Multiple old fractures. 2. No radiographic findings of osteomyelitis though, MRI is more sensitive. Electronically Signed   By: Elon Alas M.D.   On: 03/24/2017 05:27   Dg Foot 2 Views Right  Result Date: 03/24/2017 CLINICAL DATA:  Nonhealing toe ulcers. RIGHT leg swelling. History of diabetes, Charcot joint. EXAM: LEFT FOOT - 2 VIEW; RIGHT FOOT - 2 VIEW COMPARISON:  RIGHT foot radiograph February 13, 2015 and RIGHT ankle radiograph April 21, 2016. LEFT ankle radiograph February 02, 2017. FINDINGS: RIGHT foot: Acute to subacute corner fractures first proximal phalanx with intra-articular extension. No dislocation. Age indeterminate distal second metatarsal fracture. Unchanged intraosseous cyst first proximal phalanx. Old healed fifth metatarsus fracture. Osteopenia. Soft tissue swellings. Small plantar calcaneal spur. Severe vascular  calcifications. Bandage overlying third digit with soft tissue swelling. No subcutaneous gas or radiopaque foreign bodies. Ankle hardware. LEFT foot: No acute fracture deformity or dislocation. Old second and third proximal phalanx fractures. Old fifth metatarsus fracture. Osteopenia. Potential cyst first proximal phalanx. Small plantar calcaneal spur. Chronic offset of tarsal metatarsal joint space on lateral radiograph. Severe vascular calcifications. No subcutaneous gas or radiopaque foreign bodies. IMPRESSION: RIGHT foot: 1. Acute to subacute proximal first phalanx fractures. Age indeterminate second distal metatarsus fracture.Multiple old fractures. 2. Bandage over third toe, no radiographic findings of osteomyelitis though MRI is more sensitive. LEFT foot: 1. No acute fracture deformity or dislocation. Multiple old fractures. 2. No radiographic findings of osteomyelitis though, MRI is more sensitive. Electronically Signed   By: Elon Alas M.D.   On: 03/24/2017 05:27     Assessment/Plan: Nonhealing wound associated with PVD = recommend to switch to orals with doxycycline 100mg  po bid. Agree with primary team that ultimately healing of wounds will largely depend if she can  be revascularized. Defer to vascular surgery for further work up.  Will sign off.  Baxter Flattery [facility] Cell: [phone] Pager: [phone]  03/25/2017, 6:05 PM

## 2017-03-25 NOTE — Plan of Care (Signed)
Problem: Skin Integrity: Goal: Risk for impaired skin integrity will decrease Outcome: Progressing Will continue to educate patient on importance of IV abx and and xeroform dressing to toe.

## 2017-03-25 NOTE — Consult Note (Signed)
Hospital Consult    Reason for Consult:  Non healing wound bilateral toes Requesting Physician:  Carolin Sicks  MRN #:  [id number]  History of Present Illness: This is a 41 y.o. female who states that she had a blood blister presenton the 4th toe on both feet.  On Saturday evening, her dog, Rambo and 50lb bloodhound mix stepped in her right foot and tore the skin off of the 4th toe.   She states that she developed pain in her right foot that ascended up her leg the next day.   She states that she has had wounds in the past, but no trouble with them healing.  She does have neuropathy of her feet.  She does have scabs present as well.   She presented to the ED yesterday with increasing redness, leg pain and swelling.   She did have a mild leukocytosis, but was afebrile.    Dr. Sharol Given was consulted and recommended a vascular consult.  She was started on a NTG patch and Trental to improve the microcirculation in her foot.  He is uncertain if she can heal a BKA.  Pt was also seen by infectious disease and was placed on broad spectrum abx.  MRI is pending.  She is ESRD and dialyzes M/W/F at the [place] location in [place].  She dialyzes via a left RC AVF.  She states it has had revisions in the past.  It was originally created at [facility].  Her K+ today is 6.1 and will go to HD tomorrow per nephrology.    She does have thrombocytopenia with a platelet count on admission of 121k and is 94k today.  The pt is on a statin for cholesterol management.  She is on a CCB, beta blocker and catapres for blood pressure management.  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  She denies hx of MI, chest pain, CVA.  She states that she had an ultrasound of her neck a couple of years ago. She quit smoking 2.5 years ago.  Past Medical History:  Diagnosis Date  . Chronic anemia    2nd to renal disease  . CIN III (cervical intraepithelial neoplasia grade III) with severe dysplasia    S/P LEEP AND CONE  . Coronary artery disease    Status post  cardiac catheterization June 2012 scattered coronary artery disease/atherosclerosis with 70-80% stenosis in a small right PDA.  . 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 DM (diabetes mellitus) ([place])    Long-term insulin  . End stage renal disease on dialysis ([place]) 05/02/11   [facility]; M; W, F; last time 05/01/11  . Fracture of 5th metatarsal 2016   Right  . Gastroparesis   . 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 History of abscesses in groins 12/06/2010  . Hyperlipidemia    Hypertriglyceridemia 449 HDL 25  . Hypertension   . Migraines    "just on dialysis days"  . Peripheral neuropathy    related to DM  . Peripheral vascular disease ([place])    Tibial occlusive disease evaluated by Dr. Kellie Simmering in August 2011. Medical therapy  . Renal insufficiency    Dialysis since 2012  . Tobacco use disorder    Discontinued March 2012  . Trimalleolar fracture of ankle, closed 02/09/2015   Right    Past Surgical History:  Procedure Laterality Date  . AV FISTULA PLACEMENT  04/2010  . CARDIAC CATHETERIZATION [name] 02/10/2015   Procedure: Left Heart Cath and Coronary Angiography;  Surgeon: Dixie Dials, MD;  Location: [facility];  Service: Cardiovascular;  Laterality: [name];  . CARDIAC CATHETERIZATION [name] 02/21/2016   Procedure: Right Heart Cath;  Surgeon: Jolaine Artist, MD;  Location: [facility];  Service: Cardiovascular;  Laterality: [name];  . CERVICAL BIOPSY  W/ LOOP ELECTRODE EXCISION     h/o  . CERVICAL CONE BIOPSY     h/o  . COLPOSCOPY    . DILATION AND CURETTAGE OF UTERUS  2009  . ORIF ANKLE FRACTURE Right 02/09/2015  . ORIF ANKLE FRACTURE Right 02/16/2015   Procedure: OPEN REDUCTION INTERNAL FIXATION (ORIF) RIGHT ANKLE FRACTURE;  Surgeon: Altamese [place], MD;  Location: [facility];  Service: Orthopedics;  Laterality: Right;    Allergies  Allergen Reactions  . Cephalexin Other (See Comments)    Reaction unknown- Childhood allergy Tolerated Ceftriaxone in the past  .  Sulfamethoxazole-Trimethoprim Other (See Comments)    Unknown reaction. Pt states that she was told by her mother that she had allergy to Bactrim as a child.    Prior to Admission medications   Medication Sig Start Date End Date Taking? Authorizing Provider  amLODipine (NORVASC) 10 MG tablet Take 1 tablet (10 mg total) by mouth daily. 02/24/16  Yes Tat, Shanon Brow, MD  amoxicillin-clavulanate (AUGMENTIN) 500-125 MG tablet Take 1 tablet (500 mg total) by mouth daily. X 7 days, then on Mon-Wed-Fri only after dialysis 02/24/16  Yes Tat, David, MD  atorvastatin (LIPITOR) 40 MG tablet Take 40 mg by mouth daily.   Yes [provider]  cloNIDine (CATAPRES) 0.3 MG tablet Take 1 tablet (0.3 mg total) by mouth 3 (three) times daily. 11/01/14  Yes Debbe Odea, MD  cyclobenzaprine (FLEXERIL) 10 MG tablet Take 1 tablet (10 mg total) by mouth at bedtime. Patient taking differently: Take 10 mg by mouth at bedtime as needed for muscle spasms.  03/12/17  Yes Magnus Sinning, MD  Ferric Citrate (AURYXIA PO) Take 2 tablets by mouth 3 (three) times daily with meals.   Yes [provider]  HUMALOG MIX 50/50 KWIKPEN (50-50) 100 UNIT/ML Kwikpen Inject 26 Units into the skin 2 (two) times daily.  04/29/14  Yes [provider]  hydrALAZINE (APRESOLINE) 100 MG tablet Take 1 tablet (100 mg total) by mouth every 8 (eight) hours. 02/24/16  Yes Tat, Shanon Brow, MD  HYDROcodone-acetaminophen (NORCO) 5-325 MG tablet Take 1 tablet by mouth every 6 (six) hours as needed for moderate pain. 02/02/17  Yes Kirichenko, Tatyana, PA-C  insulin lispro (HUMALOG) 100 UNIT/ML injection Inject 6 Units into the skin 3 (three) times daily before meals.   Yes [provider]  isosorbide mononitrate (IMDUR) 120 MG 24 hr tablet Take 1 tablet (120 mg total) by mouth daily. 02/25/16  Yes Tat, Shanon Brow, MD  labetalol (NORMODYNE) 300 MG tablet Take 1 tablet (300 mg total) by mouth 3 (three) times daily. 11/01/14  Yes Debbe Odea, MD    RENVELA 800 MG tablet Take 4,800 mg by mouth 3 (three) times daily with meals.  04/17/13  Yes [provider]  traMADol (ULTRAM) 50 MG tablet Take 1 tablet (50 mg total) by mouth every 6 (six) hours as needed for moderate pain (or Headache unrelieved by tylenol). 02/24/16  Deveron Furlong, MD    Social History   Social History  . Marital status: Married    Spouse name: [name]  . Number of children: [name]  . Years of education: [name]   Occupational History  .  [employer]   Social History Main Topics  .  Smoking status: Former Smoker    Packs/day: 0.30    Years: 10.00    Types: Cigarettes    Quit date: 08/05/2011  . Smokeless tobacco: Never Used     Comment: "quit smoking cigarettes 07/2010"  . Alcohol use No  . Drug use: No  . Sexual activity: Not on file   Other Topics Concern  . Not on file   Social History Narrative  . No narrative on file     Family History  Problem Relation Age of Onset  . Diabetes Mother   . Hypertension Mother   . Diabetes Father   . Hyperlipidemia Father   . Hypertension Father   . Diabetes Sister   . Stroke Maternal Grandmother   . Diabetes Sister   . Breast cancer Maternal Aunt        Age 62's    ROS: [x]  Positive   [ ]  Negative   [ ]  All sytems reviewed and are negative  Cardiac: []  chest pain/pressure []  palpitations []  SOB lying flat []  DOE  Vascular: []  pain in legs while walking []  pain in legs at rest []  pain in legs at night []  non-healing ulcers []  hx of DVT []  swelling in legs  Pulmonary: []  productive cough []  asthma/wheezing []  home O2  Neurologic: []  weakness in []  arms []  legs []  numbness in []  arms []  legs []  hx of CVA []  mini stroke [] difficulty speaking or slurred speech []  temporary loss of vision in one eye []  dizziness [x]  hx migraines  Hematologic: []  hx of cancer []  bleeding problems []  problems with blood clotting easily [x]  anemia of chronic disease [x]  thrombocytopenia  Endocrine:    [x]  diabetes []  thyroid disease  GI []  vomiting blood []  blood in stool  GU: [x]  CKD/renal failure [x]  HD--[x]  M/W/F or []  T/T/S []  burning with urination []  blood in urine  Psychiatric: []  anxiety []  depression  Musculoskeletal: []  arthritis []  joint pain  Integumentary: []  rashes []  ulcers  Constitutional: []  fever []  chills   Physical Examination  Vitals:   03/25/17 1101 03/25/17 1148  BP: (!) 183/78 (!) 201/90  Pulse: 70 70  Resp: 16 17  Temp: (!) 97.4 F (36.3 C) 98.2 F (36.8 C)  SpO2: 94% 95%   Body mass index is 26.14 kg/m.    General:  WDWN in NAD Gait: Not observed HENT: WNL, normocephalic Pulmonary: normal non-labored breathing, without Rales, rhonchi,  wheezing Cardiac: regular, without  Murmurs, rubs or gallops; with left carotid bruit Abdomen:  soft, NT/ND, no masses Skin: without rashes Vascular Exam/Pulses:  Right Left  Radial 2+ (normal) 2+ (normal)  Femoral 2+ (normal) 2+ (normal)  Popliteal Unable to palpate  Unable to palpate   DP Unable to palpate  Unable to palpate   PT Unable to palpate  Unable to palpate    Extremities: left foot warmer than right;   Right foot.  Left foot    Musculoskeletal: no muscle wasting or atrophy  Neurologic: A&O X 3;  No focal weakness or paresthesias are detected; speech is fluent/normal Psychiatric:  The pt has Normal affect. Lymph:  No inguinal lymphadenopathy   CBC    Component Value Date/Time   WBC 7.9 03/25/2017 0221   RBC 3.55 (L) 03/25/2017 0221   HGB 10.3 (L) 03/25/2017 0221   HCT 33.1 (L) 03/25/2017 0221   PLT 94 (L) 03/25/2017 0221   MCV 93.2 03/25/2017 0221   MCH 29.0 03/25/2017 0221   MCHC 31.1 03/25/2017 0221  RDW 17.6 (H) 03/25/2017 0221   LYMPHSABS 1.4 03/24/2017 0409   MONOABS 0.7 03/24/2017 0409   EOSABS 0.1 03/24/2017 0409   BASOSABS 0.0 03/24/2017 0409    BMET    Component Value Date/Time   NA 136 03/25/2017 0800   K 6.1 (H) 03/25/2017 0800   CL 95  (L) 03/25/2017 0800   CO2 23 03/25/2017 0800   GLUCOSE 189 (H) 03/25/2017 0800   BUN 78 (H) 03/25/2017 0800   CREATININE 8.25 (H) 03/25/2017 0800   CALCIUM 8.3 (L) 03/25/2017 0800   GFRNONAA 5 (L) 03/25/2017 0800   GFRAA 6 (L) 03/25/2017 0800    COAGS: Lab Results  Component Value Date   INR 1.24 02/15/2016   INR 1.14 02/15/2015   INR 1.09 02/09/2015     Non-Invasive Vascular Imaging:   ABI'S 03/24/17: Final Interpretation: Right: Resting right ankle-brachial index indicates noncompressible right lower extremity arteries.The right toe-brachial index is abnormal. Left: Resting left ankle-brachial index indicates noncompressible right lower extremity arteries.The left toe-brachial index is normal.  Statin:  Yes.   Beta Blocker:  Yes.   Aspirin:  No. ACEI:  No. ARB:  No. CCB use:  Yes Other antiplatelets/anticoagulants:  Yes.   SQ heparin   ASSESSMENT/PLAN: This is a 41 y.o. female with ESRD and IDDM with non healing wounds bilateral feet.   -pt ABI's reveal non compressible vessels bilaterally.  She does have non healing wounds bilateral feet (see above pictures) the left foot is warmer than the right.  I cannot palpate pulses and there is no doppler available.  She will need arteriogram to evaluate arterial anatomy with possible intervention.  Of note, she does have thrombocytopenia with platelet count today of 94k. -antibiotics per infectious disease -MRI pending -she does have a right carotid bruit-will order carotid duplex. -Dr. Trula Slade to see pt this afternoon.   [name], PA-C [facility] [phone] I agree with the above.  I have seen and evaluated the patient.  She has bilateral nonhealing wounds with diabetes.  She does not have palpable pedal pulses.  In order to attempt limb salvage, she will need arteriogram to determine if she has any options for revascularization.  The right leg appears to be bothering her the worst.  Therefore I  would plan on aortogram with bilateral runoff and possible intervention of the right leg if feasible.  She is on dialysis.  This will be scheduled for Thursday.  [name]

## 2017-03-25 NOTE — Consult Note (Signed)
[facility] CM Primary Care Navigator  03/25/2017  [name] Feb 08, 1976 [id number]   Went to see patient earlier today at the bedsideto identify possible discharge needs but staff reports that sheis inhemodialysis.  Will attemptto see patient at another time when she is available in the room.    Addendum (03/26/17):  Went back to see patient in the room but staff reports that she is back to hemodialysis at this time.  Will try to meet with patient at another time when available.    Addendum (03/27/17):  Went back and attempted to see patient after her procedure this morning (ABDOMINAL AORTOGRAM W/LOWER EXTREMITY) but she is getting prepared by RN for another procedure (Carotid ultrasound). Met with patient and family member at the bedside but transporter came in to pick-up patient for the procedure.   Will try to meet with patient again at another time when available.    Addendum (03/28/17):   Went back to see patient at the bedsideto identify possible discharge needs. Patientreports having severe pain, swelling, redness and warmth to the right foot that spread up to her right legwhichhad led to this admission.  Patientendorses Dr. Merrilee Se[facility]ore [facility] as herprimary [facility] provider.   [facility]ized [facility] [address] to obtain medications without difficulty.   Patient reportsmanagingher medications at home with husband's assistance using "pill box system" filled once a week.  Patient states that husband [name]) or daughter [name]) providetransportation to her doctors'appointments and dialysis M-W-F.  Patient verbalized that her husband and daughter-(who lives with them) are her primary caregivers at home.  Anticipateddischarge plan is home once medically stable per patient, with home health services.  Patientvoiced understanding to call primary care provider's officewhen  shereturnshome,for a post discharge follow-up within a week or sooner if needs arise.Patient letter (with PCP's contact number) was provided as areminder.   Discussed with patient regarding [facility] CM services available for health management at home but she verbalized decline of services. Patient had refused [facility] services that was offered to her on different times, except for EMMI calls to follow-up her recovery.  She states she is able to manage herself at home. Patient was able to verbalize ways in managing DM and HF such as regular monitoring and recording of weight and blood sugar; diet restrictions; exercise; medications; reportable signs and symptoms and close follow-up with providers when needed.   Encouraged patientto seekreferral from primary care provider to [facility] care management if she changes her mind and if deemednecessaryand appropriate for services in the near future.  [facility] care management information was provided for future needs that she may have.  Primary care provider's office is listed as doing transition of care (TOC).   For additional questions please contact:  Edwena Felty A. Marquelle Musgrave, BSN, RN-BC [facility] PRIMARY CARE Navigator Cell: [phone]

## 2017-03-26 ENCOUNTER — Inpatient Hospital Stay (HOSPITAL_COMMUNITY): Payer: Medicare Other

## 2017-03-26 DIAGNOSIS — N186 End stage renal disease: Secondary | ICD-10-CM

## 2017-03-26 DIAGNOSIS — I160 Hypertensive urgency: Secondary | ICD-10-CM

## 2017-03-26 DIAGNOSIS — Z992 Dependence on renal dialysis: Secondary | ICD-10-CM

## 2017-03-26 DIAGNOSIS — E1122 Type 2 diabetes mellitus with diabetic chronic kidney disease: Secondary | ICD-10-CM

## 2017-03-26 DIAGNOSIS — Z794 Long term (current) use of insulin: Secondary | ICD-10-CM

## 2017-03-26 DIAGNOSIS — E1129 Type 2 diabetes mellitus with other diabetic kidney complication: Secondary | ICD-10-CM | POA: Diagnosis not present

## 2017-03-26 DIAGNOSIS — L98491 Non-pressure chronic ulcer of skin of other sites limited to breakdown of skin: Secondary | ICD-10-CM

## 2017-03-26 LAB — BASIC METABOLIC PANEL
ANION GAP: 18 — AB (ref 5–15)
BUN: 56 mg/dL — ABNORMAL HIGH (ref 6–20)
CALCIUM: 8.3 mg/dL — AB (ref 8.9–10.3)
CO2: 24 mmol/L (ref 22–32)
Chloride: 94 mmol/L — ABNORMAL LOW (ref 101–111)
Creatinine, Ser: 6.57 mg/dL — ABNORMAL HIGH (ref 0.44–1.00)
GFR, EST AFRICAN AMERICAN: 8 mL/min — AB (ref >60)
GFR, EST NON AFRICAN AMERICAN: 7 mL/min — AB (ref >60)
Glucose, Bld: 88 mg/dL (ref 65–99)
Potassium: 5.1 mmol/L (ref 3.5–5.1)
Sodium: 136 mmol/L (ref 135–145)

## 2017-03-26 LAB — GLUCOSE, CAPILLARY
GLUCOSE-CAPILLARY: 108 mg/dL — AB (ref 65–99)
GLUCOSE-CAPILLARY: 131 mg/dL — AB (ref 65–99)
Glucose-Capillary: 142 mg/dL — ABNORMAL HIGH (ref 65–99)

## 2017-03-26 LAB — CBC
HCT: 33.2 % — ABNORMAL LOW (ref 36.0–46.0)
Hemoglobin: 10.3 g/dL — ABNORMAL LOW (ref 12.0–15.0)
MCH: 28.9 pg (ref 26.0–34.0)
MCHC: 31 g/dL (ref 30.0–36.0)
MCV: 93 fL (ref 78.0–100.0)
PLATELETS: 87 10*3/uL — AB (ref 150–400)
RBC: 3.57 MIL/uL — ABNORMAL LOW (ref 3.87–5.11)
RDW: 17.2 % — AB (ref 11.5–15.5)
WBC: 7.8 10*3/uL (ref 4.0–10.5)

## 2017-03-26 MED ORDER — DARBEPOETIN ALFA 200 MCG/0.4ML IJ SOSY
PREFILLED_SYRINGE | INTRAMUSCULAR | Status: AC
  Administered 2017-03-26: 200 ug via INTRAVENOUS
  Filled 2017-03-26: qty 0.4

## 2017-03-26 MED ORDER — DOXERCALCIFEROL 4 MCG/2ML IV SOLN
INTRAVENOUS | Status: AC
  Administered 2017-03-26: 7 ug via INTRAVENOUS
  Filled 2017-03-26: qty 4

## 2017-03-26 MED ORDER — ALTEPLASE 2 MG IJ SOLR: 2.0000 mg | Freq: Once | INTRAMUSCULAR | Status: DC | PRN

## 2017-03-26 MED ORDER — SODIUM CHLORIDE 0.9 % IV SOLN: 100.0000 mL | INTRAVENOUS | Status: DC | PRN

## 2017-03-26 MED ORDER — PROMETHAZINE HCL 25 MG/ML IJ SOLN
INTRAMUSCULAR | Status: AC
  Administered 2017-03-26: 12.5 mg via INTRAVENOUS
  Filled 2017-03-26: qty 1

## 2017-03-26 MED ORDER — PROMETHAZINE HCL 25 MG/ML IJ SOLN
12.5000 mg | Freq: Once | INTRAMUSCULAR | Status: AC
  Administered 2017-03-26: 12.5 mg via INTRAVENOUS

## 2017-03-26 NOTE — Progress Notes (Signed)
   03/26/17 1249  Vitals  BP (!) 212/89  BP Location Right Arm  BP Method Automatic  Patient Position (if appropriate) Lying  Pulse Rate 70  Pulse Rate Source Dinamap   Pt back from dialysis, antihypertensives given as ordered (see EMAR)  upon return, no complaints offered, will continue to monitor.

## 2017-03-26 NOTE — Progress Notes (Signed)
PROGRESS NOTE  [name] LXB:[id number] DOB: [birth year] DOA: 03/24/2017 PCP: Merrilee Seashore, MD  HPI/Recap of past 67 hours: 41 year old female with history of hypertension, diabetes, CAD, end-stage renal disease on hemodialysis presented with worsening right lower leg/foot pain, swelling and erythema.  Patient was found to have nonhealing diabetic foot ulcer with possible cellulitis and was started on IV antibiotics.  Today, patient denied any new complaints, denies chest pain, shortness of breath, cough, abdominal pain, nausea/vomiting, diarrhea, fever/chills  Assessment/Plan: Principal Problem:   Cellulitis in diabetic foot (HCC) Active Problems:   End stage renal disease on dialysis ([place])   Coronary artery disease   DM (diabetes mellitus), type 2 with renal complications (HCC)   Hypertension   Hypertensive urgency   Cellulitis   Chronic pain   Diabetic ulcer of lower leg (HCC)   Gangrene of toe of right foot (HCC)   Non-healing ulcer (HCC)   #Nonhealing diabetic foot ulcer with possible cellulitis Stable X-ray showed multiple acute and old fractures with multiple wound and scabs on bilateral foot and toes MRI showed acute fracture of the first metatarsal and subacute fracture of the second and fifth metatarsal of the right foot.  No strong evidence of acute osteomyelitis ABI showed noncompressible vessels likely due to severe peripheral vascular disease Vascular surgery on consult, recommend possible aortogram with possible intervention on 03/27/17 Continue on IV vancomycin, ceftriaxone and Flagyl as per ID Local wound care Continue current pain management  #ESRD On hemodialysis, Monday Wednesday Friday Daily BMP  #Hypertensive urgency Stable Continue amlodipine, clonidine, hydralazine, labetalol, imdur  #Anemia of chronic kidney disease Continue ESA during dialysis Daily CBC  #Thrombocytopenia Unknown etiology Monitor closely, daily  CBC  #Diabetes Continue current insulin regimen    Code Status: Full  Family Communication: Discussed with husband  Disposition Plan: Home once stable   Consultants:  Nephrology  Orthopedics  ID  Procedures:  Hemodialysis  Antimicrobials: IV ceftriaxone IV vancomycin P.o. metronidazole  DVT prophylaxis: Heparin   Objective: Vitals:   03/26/17 1130 03/26/17 1200 03/26/17 1249 03/26/17 1635  BP: (!) 211/100 (!) 207/83 (!) 212/89 (!) 186/80  Pulse: 71 71 70 68  Resp:  18 20 18   Temp:  98.2 F (36.8 C) 98.8 F (37.1 C) 97.8 F (36.6 C)  TempSrc:  Oral Oral Oral  SpO2:  94% 94% 97%  Weight:  93.3 kg (205 lb 11 oz)    Height:        Intake/Output Summary (Last 24 hours) at 03/26/17 2048 Last data filed at 03/26/17 1500  Gross per 24 hour  Intake            828.5 ml  Output             5011 ml  Net          -4182.5 ml   Filed Weights   03/25/17 2145 03/26/17 0805 03/26/17 1200  Weight: 98.7 kg (217 lb 11.2 oz) 98.8 kg (217 lb 13 oz) 93.3 kg (205 lb 11 oz)    Exam:   General: Alert, awake, oriented x3  Cardiovascular: S1-S2 present no added heart sounds  Respiratory: Clear to auscultation bilaterally  Abdomen: Soft, nontender, nondistended, positive bowel sounds  Musculoskeletal: Left forearm fistula intact, right leg is diffusely erythematous  Skin: Multiple superficial lesions on shins bilaterally  Psychiatry: Normal mood   Data Reviewed: CBC:  Recent Labs Lab 03/24/17 0032 03/24/17 0409 03/25/17 0221 03/26/17 0341  WBC 11.2* 11.4* 7.9 7.8  NEUTROABS  9.5* 9.1*  --   --   HGB 10.6* 10.5* 10.3* 10.3*  HCT 34.2* 32.2* 33.1* 33.2*  MCV 91.7 92.0 93.2 93.0  PLT 121* 109* 94* 87*   Basic Metabolic Panel:  Recent Labs Lab 03/24/17 0032 03/24/17 0409 03/25/17 0800 03/26/17 0341  NA 138 137 136 136  K 4.8 4.9 6.1* 5.1  CL 96* 98* 95* 94*  CO2 26 22 23 24   GLUCOSE 152* 85 189* 88  BUN 58* 60* 78* 56*  CREATININE 7.38*  7.49* 8.25* 6.57*  CALCIUM 8.7* 8.5* 8.3* 8.3*  PHOS  --   --  10.6*  --    GFR: Estimated Creatinine Clearance: 14.6 mL/min (A) (by C-G formula based on SCr of 6.57 mg/dL (H)). Liver Function Tests:  Recent Labs Lab 03/24/17 0032 03/25/17 0800  AST 18  --   ALT 15  --   ALKPHOS 236*  --   BILITOT 1.0  --   PROT 6.8  --   ALBUMIN 3.4* 3.0*   No results for input(s): LIPASE, AMYLASE in the last 168 hours. No results for input(s): AMMONIA in the last 168 hours. Coagulation Profile: No results for input(s): INR, PROTIME in the last 168 hours. Cardiac Enzymes: No results for input(s): CKTOTAL, CKMB, CKMBINDEX, TROPONINI in the last 168 hours. BNP (last 3 results) No results for input(s): PROBNP in the last 8760 hours. HbA1C:  Recent Labs  03/24/17 0409  HGBA1C 9.5*   CBG:  Recent Labs Lab 03/25/17 1146 03/25/17 1715 03/25/17 2220 03/26/17 1312 03/26/17 1631  GLUCAP 199* 270* 122* 108* 131*   Lipid Profile: No results for input(s): CHOL, HDL, LDLCALC, TRIG, CHOLHDL, LDLDIRECT in the last 72 hours. Thyroid Function Tests: No results for input(s): TSH, T4TOTAL, FREET4, T3FREE, THYROIDAB in the last 72 hours. Anemia Panel: No results for input(s): VITAMINB12, FOLATE, FERRITIN, TIBC, IRON, RETICCTPCT in the last 72 hours. Urine analysis:    Component Value Date/Time   COLORURINE YELLOW 02/02/2017 1123   APPEARANCEUR CLOUDY (A) 02/02/2017 1123   LABSPEC 1.008 02/02/2017 1123   PHURINE 9.0 (H) 02/02/2017 1123   GLUCOSEU >=500 (A) 02/02/2017 1123   HGBUR NEGATIVE 02/02/2017 1123   BILIRUBINUR NEGATIVE 02/02/2017 1123   KETONESUR NEGATIVE 02/02/2017 1123   PROTEINUR >=300 (A) 02/02/2017 1123   UROBILINOGEN 0.2 02/08/2011 1812   NITRITE NEGATIVE 02/02/2017 1123   LEUKOCYTESUR NEGATIVE 02/02/2017 1123   Sepsis Labs: @LABRCNTIP (procalcitonin:4,lacticidven:4)  ) Recent Results (from the past 240 hour(s))  Blood Cultures x 2 sites     Status: None (Preliminary  result)   Collection Time: 03/24/17  4:20 AM  Result Value Ref Range Status   Specimen Description BLOOD RIGHT HAND  Final   Special Requests   Final    BOTTLES DRAWN AEROBIC AND ANAEROBIC Blood Culture adequate volume   Culture NO GROWTH 2 DAYS  Final   Report Status PENDING  Incomplete  Blood Cultures x 2 sites     Status: None (Preliminary result)   Collection Time: 03/24/17  4:40 AM  Result Value Ref Range Status   Specimen Description BLOOD RIGHT FOREARM  Final   Special Requests   Final    BOTTLES DRAWN AEROBIC AND ANAEROBIC Blood Culture adequate volume   Culture NO GROWTH 2 DAYS  Final   Report Status PENDING  Incomplete  MRSA PCR Screening     Status: None   Collection Time: 03/24/17  6:51 AM  Result Value Ref Range Status   MRSA by PCR NEGATIVE NEGATIVE  Final    Comment:        The GeneXpert MRSA Assay (FDA approved for NASAL specimens only), is one component of a comprehensive MRSA colonization surveillance program. It is not intended to diagnose MRSA infection nor to guide or monitor treatment for MRSA infections.       Studies: Mr Foot Right Wo Contrast  Result Date: 03/25/2017 CLINICAL DATA:  Insulin-dependent diabetic with worsening right lower foot pain and swelling with erythema. Nonhealing ulcers involving the toes. EXAM: MRI OF THE RIGHT FOREFOOT WITHOUT CONTRAST TECHNIQUE: Multiplanar, multisequence MR imaging of the right foot was performed. No intravenous contrast was administered. COMPARISON:  03/24/2017 FINDINGS: Bones/Joint/Cartilage Acute to subacute proximal first phalangeal base fracture with intra-articular extension. Nondisplaced fracture with associated marrow changes along the proximal diaphysis of the first metatarsal. Marrow edema involving the distal diaphysis, neck and head of the second and third metatarsals with associated marrow edema of the base of the second and third proximal phalanges. Subacute appearing fracture involving the second  metatarsal head- neck junction. Mild distal fourth metatarsal marrow edema as well. Subacute fifth metatarsal fracture with minimal residual marrow edema. Ligaments Noncontributory Muscles and Tendons Mild intramuscular edema along the plantar aspect of the foot consistent with myositis. No pyomyositis. No tenosynovitis or significant tendinopathy. Soft tissues Diffuse plantar and dorsal forefoot soft tissue swelling without focal fluid collection or abscess. IMPRESSION: 1. Reactive marrow edema secondary to acute or subacute intra-articular fracture at the base of the right first proximal phalanx and along the proximal diaphysis of the first metatarsal. 2. Subacute fractures of the distal second and fifth metatarsal with reactive marrow edema. 3. No conclusive evidence for acute osteomyelitis. Marrow edema and fractures are believed to reflect changes of diabetic neuropathy. Electronically Signed   By: Ashley Royalty M.D.   On: 03/25/2017 21:37    Scheduled Meds: . amLODipine  10 mg Oral Daily  . atorvastatin  40 mg Oral q1800  . cinacalcet  60 mg Oral Q supper  . cloNIDine  0.3 mg Oral TID  . cyclobenzaprine  10 mg Oral QHS  . darbepoetin (ARANESP) injection - DIALYSIS  200 mcg Intravenous Q Wed-HD  . doxercalciferol  7 mcg Intravenous Q M,W,F-HD  . doxycycline  100 mg Oral Q12H  . feeding supplement (NEPRO CARB STEADY)  237 mL Oral BID BM  . feeding supplement (PRO-STAT SUGAR FREE 64)  30 mL Oral BID  . ferric citrate  630 mg Oral TID WC  . heparin  5,000 Units Subcutaneous Q8H  . hydrALAZINE  100 mg Oral Q8H  . insulin aspart  0-5 Units Subcutaneous QHS  . insulin aspart  0-9 Units Subcutaneous TID WC  . insulin detemir  20 Units Subcutaneous Daily  . isosorbide mononitrate  120 mg Oral Daily  . labetalol  300 mg Oral TID  . multivitamin  1 tablet Oral QHS  . nitroGLYCERIN  0.2 mg Transdermal Daily  . pentoxifylline  400 mg Oral TID WC  . sevelamer carbonate  4,800 mg Oral TID WC  . sodium  chloride flush  3 mL Intravenous Q12H    Continuous Infusions: . sodium chloride 250 mL (03/24/17 0409)  . sodium chloride    . sodium chloride       LOS: 2 days     Alma Friendly, MD [facility]   If 7PM-7AM, please contact night-coverage [email or web address] Password [email or web address] 03/26/2017, 8:48 PM

## 2017-03-26 NOTE — Progress Notes (Signed)
   03/26/17 1635  Vitals  BP (!) 186/80  BP Location Right Arm  BP Method Automatic  Patient Position (if appropriate) Lying  Pulse Rate 68  Pulse Rate Source Dinamap  Antihypertensives given as ordered, see EMAR, pt resting in bed without complaints, no acute distress observed at this time, call bell in reach, will continue to monitor.

## 2017-03-26 NOTE — Progress Notes (Signed)
Dialysis:  [facility] MWF 4h 78min   89kg   2/2   L AVF   Hep 2000 -Hectorol 59mcg IV tiw -Venofer 50mg  IV q Wed -Mircera 200 mcg IV q 2 weeks (last dosed 10/17) BMM: Auryixa 3 tabs qac, Renvela 6 qac, 2 q snacks, Sensipar 60 qd   [facility] Progress Note   Assessment: 1. Nonhealing diabetic foot ulcers w/ possible cellulitis - on IV abx. Seen by [facility], needs angiogram, and possible intervention on the R if feasible.  MRI pending.  2. ESRD -  MWF HD 3. HTN/volume excess  - marked inpt wt gains, prob drinking extra liquids but she denies. 4. Anemia  - Hgb 10.5, Next ESA due 10/31  5. Metabolic bone disease -  Cont VDRA/binders  6. Nutrition - Renal diet/vitamins/prostat for low albumin  7. DM- per primary  8. Thrombocytopenia - hold heparin next HD  9. H/o ascites and hx of paracentesis   Plan -  HD today, max UF as tolerated. Needs to reduce fluid intake, talked with her but she is not responsive to suggestions.   Kelly Splinter MD [employer] pager [phone]   03/26/2017, 10:53 AM  Subjective: Seen on HD. No c/o today.    Objective Vitals:   03/26/17 0830 03/26/17 0900 03/26/17 0930 03/26/17 1000  BP: (!) 195/92 (!) 196/94 (!) 195/90 (!) 192/86  Pulse: 66 68 67 67  Resp:      Temp:      TempSrc:      SpO2:      Weight:      Height:       Physical Exam General: WNWD female NAD  Heart: RRR 2/6 SEM  Lungs: CTAB Abdomen: distended with fluid wave  Extremities: bilat foot ulcers, R foot / ankle mild erythema, not really tender today Dialysis Access: LUE AVF cannulated on HD  Additional Objective Labs: Basic Metabolic Panel:  Recent Labs Lab 03/24/17 0409 03/25/17 0800 03/26/17 0341  NA 137 136 136  K 4.9 6.1* 5.1  CL 98* 95* 94*  CO2 22 23 24   GLUCOSE 85 189* 88  BUN 60* 78* 56*  CREATININE 7.49* 8.25* 6.57*  CALCIUM 8.5* 8.3* 8.3*  PHOS  --  10.6*  --    Liver Function Tests:  Recent Labs Lab 03/24/17 0032 03/25/17 0800   AST 18  --   ALT 15  --   ALKPHOS 236*  --   BILITOT 1.0  --   PROT 6.8  --   ALBUMIN 3.4* 3.0*   No results for input(s): LIPASE, AMYLASE in the last 168 hours. CBC:  Recent Labs Lab 03/24/17 0032 03/24/17 0409 03/25/17 0221 03/26/17 0341  WBC 11.2* 11.4* 7.9 7.8  NEUTROABS 9.5* 9.1*  --   --   HGB 10.6* 10.5* 10.3* 10.3*  HCT 34.2* 32.2* 33.1* 33.2*  MCV 91.7 92.0 93.2 93.0  PLT 121* 109* 94* 87*   Blood Culture    Component Value Date/Time   SDES BLOOD RIGHT FOREARM 03/24/2017 0440   SPECREQUEST  03/24/2017 0440    BOTTLES DRAWN AEROBIC AND ANAEROBIC Blood Culture adequate volume   CULT NO GROWTH 1 DAY 03/24/2017 0440   REPTSTATUS PENDING 03/24/2017 0440    Cardiac Enzymes: No results for input(s): CKTOTAL, CKMB, CKMBINDEX, TROPONINI in the last 168 hours. CBG:  Recent Labs Lab 03/24/17 1706 03/24/17 2158 03/25/17 1146 03/25/17 1715 03/25/17 2220  GLUCAP 252* 222* 199* 270* 122*   Iron Studies: No results for  input(s): IRON, TIBC, TRANSFERRIN, FERRITIN in the last 72 hours. Lab Results  Component Value Date   INR 1.24 02/15/2016   INR 1.14 02/15/2015   INR 1.09 02/09/2015   Medications: . sodium chloride 250 mL (03/24/17 0409)   . amLODipine  10 mg Oral Daily  . atorvastatin  40 mg Oral q1800  . cinacalcet  60 mg Oral Q supper  . cloNIDine  0.3 mg Oral TID  . cyclobenzaprine  10 mg Oral QHS  . darbepoetin (ARANESP) injection - DIALYSIS  200 mcg Intravenous Q Wed-HD  . doxercalciferol  7 mcg Intravenous Q M,W,F-HD  . doxycycline  100 mg Oral Q12H  . feeding supplement (NEPRO CARB STEADY)  237 mL Oral BID BM  . feeding supplement (PRO-STAT SUGAR FREE 64)  30 mL Oral BID  . ferric citrate  630 mg Oral TID WC  . heparin  5,000 Units Subcutaneous Q8H  . hydrALAZINE  100 mg Oral Q8H  . insulin aspart  0-5 Units Subcutaneous QHS  . insulin aspart  0-9 Units Subcutaneous TID WC  . insulin detemir  20 Units Subcutaneous Daily  . isosorbide  mononitrate  120 mg Oral Daily  . labetalol  300 mg Oral TID  . multivitamin  1 tablet Oral QHS  . nitroGLYCERIN  0.2 mg Transdermal Daily  . pentoxifylline  400 mg Oral TID WC  . sevelamer carbonate  4,800 mg Oral TID WC  . sodium chloride flush  3 mL Intravenous Q12H

## 2017-03-27 ENCOUNTER — Encounter (HOSPITAL_COMMUNITY): Payer: Self-pay | Admitting: Vascular Surgery

## 2017-03-27 ENCOUNTER — Encounter (HOSPITAL_COMMUNITY)
Admission: EM | Disposition: A | Discharge status: Home / Self Care | Payer: Self-pay | Source: Home / Self Care | Attending: Internal Medicine

## 2017-03-27 ENCOUNTER — Inpatient Hospital Stay (HOSPITAL_COMMUNITY): Payer: Medicare Other

## 2017-03-27 DIAGNOSIS — I2583 Coronary atherosclerosis due to lipid rich plaque: Secondary | ICD-10-CM

## 2017-03-27 DIAGNOSIS — I2510 Atherosclerotic heart disease of native coronary artery without angina pectoris: Secondary | ICD-10-CM

## 2017-03-27 DIAGNOSIS — R0989 Other specified symptoms and signs involving the circulatory and respiratory systems: Secondary | ICD-10-CM

## 2017-03-27 DIAGNOSIS — I10 Essential (primary) hypertension: Secondary | ICD-10-CM

## 2017-03-27 DIAGNOSIS — I96 Gangrene, not elsewhere classified: Secondary | ICD-10-CM

## 2017-03-27 HISTORY — PX: ABDOMINAL AORTOGRAM W/LOWER EXTREMITY: CATH118223

## 2017-03-27 LAB — GLUCOSE, CAPILLARY
GLUCOSE-CAPILLARY: 94 mg/dL (ref 65–99)
Glucose-Capillary: 109 mg/dL — ABNORMAL HIGH (ref 65–99)
Glucose-Capillary: 250 mg/dL — ABNORMAL HIGH (ref 65–99)

## 2017-03-27 LAB — PROTIME-INR
INR: 1.13
PROTHROMBIN TIME: 14.4 s (ref 11.4–15.2)

## 2017-03-27 LAB — CBC
HEMATOCRIT: 34.5 % — AB (ref 36.0–46.0)
Hemoglobin: 10.3 g/dL — ABNORMAL LOW (ref 12.0–15.0)
MCH: 28.2 pg (ref 26.0–34.0)
MCHC: 29.9 g/dL — ABNORMAL LOW (ref 30.0–36.0)
MCV: 94.5 fL (ref 78.0–100.0)
PLATELETS: 112 10*3/uL — AB (ref 150–400)
RBC: 3.65 MIL/uL — AB (ref 3.87–5.11)
RDW: 17.3 % — ABNORMAL HIGH (ref 11.5–15.5)
WBC: 6 10*3/uL (ref 4.0–10.5)

## 2017-03-27 LAB — BASIC METABOLIC PANEL
ANION GAP: 13 (ref 5–15)
BUN: 42 mg/dL — AB (ref 6–20)
CO2: 29 mmol/L (ref 22–32)
CREATININE: 5.28 mg/dL — AB (ref 0.44–1.00)
Calcium: 8.4 mg/dL — ABNORMAL LOW (ref 8.9–10.3)
Chloride: 93 mmol/L — ABNORMAL LOW (ref 101–111)
GFR calc Af Amer: 11 mL/min — ABNORMAL LOW (ref >60)
GFR calc non Af Amer: 9 mL/min — ABNORMAL LOW (ref >60)
GLUCOSE: 114 mg/dL — AB (ref 65–99)
Potassium: 4.6 mmol/L (ref 3.5–5.1)
SODIUM: 135 mmol/L (ref 135–145)

## 2017-03-27 SURGERY — ABDOMINAL AORTOGRAM W/LOWER EXTREMITY
Anesthesia: LOCAL

## 2017-03-27 MED ORDER — FENTANYL CITRATE (PF) 100 MCG/2ML IJ SOLN
INTRAMUSCULAR | Status: DC | PRN
  Administered 2017-03-27 (×2): 50 ug via INTRAVENOUS

## 2017-03-27 MED ORDER — MIDAZOLAM HCL 2 MG/2ML IJ SOLN
INTRAMUSCULAR | Status: AC
  Filled 2017-03-27: qty 2

## 2017-03-27 MED ORDER — SODIUM CHLORIDE 0.9 % IV SOLN: 250.0000 mL | INTRAVENOUS | Status: DC | PRN

## 2017-03-27 MED ORDER — LABETALOL HCL 5 MG/ML IV SOLN: 10.0000 mg | INTRAVENOUS | Status: DC | PRN

## 2017-03-27 MED ORDER — HYDRALAZINE HCL 20 MG/ML IJ SOLN: 10.0000 mg | Freq: Three times a day (TID) | INTRAMUSCULAR | Status: DC | PRN

## 2017-03-27 MED ORDER — VIPERSLIDE LUBRICANT OPTIME
TOPICAL | Status: DC | PRN
  Administered 2017-03-27: 13:00:00 via SURGICAL_CAVITY

## 2017-03-27 MED ORDER — NITROGLYCERIN IN D5W 200-5 MCG/ML-% IV SOLN
INTRAVENOUS | Status: AC
  Filled 2017-03-27: qty 250

## 2017-03-27 MED ORDER — HEPARIN SODIUM (PORCINE) 1000 UNIT/ML DIALYSIS: 2000.0000 [IU] | Freq: Once | INTRAMUSCULAR | Status: DC

## 2017-03-27 MED ORDER — MORPHINE SULFATE (PF) 4 MG/ML IV SOLN
2.0000 mg | INTRAVENOUS | Status: DC | PRN
Start: 2017-03-27 — End: 2017-03-29

## 2017-03-27 MED ORDER — SODIUM CHLORIDE 0.9 % IV SOLN: 100.0000 mL | INTRAVENOUS | Status: DC | PRN

## 2017-03-27 MED ORDER — PENTAFLUOROPROP-TETRAFLUOROETH EX AERO: 1.0000 "application " | INHALATION_SPRAY | CUTANEOUS | Status: DC | PRN

## 2017-03-27 MED ORDER — FENTANYL CITRATE (PF) 100 MCG/2ML IJ SOLN
INTRAMUSCULAR | Status: AC
  Filled 2017-03-27: qty 2

## 2017-03-27 MED ORDER — MIDAZOLAM HCL 2 MG/2ML IJ SOLN
INTRAMUSCULAR | Status: DC | PRN
  Administered 2017-03-27: 1 mg via INTRAVENOUS

## 2017-03-27 MED ORDER — LIDOCAINE HCL (PF) 1 % IJ SOLN
INTRAMUSCULAR | Status: DC | PRN
  Administered 2017-03-27: 15 mL

## 2017-03-27 MED ORDER — ALTEPLASE 2 MG IJ SOLR: 2.0000 mg | Freq: Once | INTRAMUSCULAR | Status: DC | PRN

## 2017-03-27 MED ORDER — LIDOCAINE HCL (PF) 1 % IJ SOLN: 5.0000 mL | INTRAMUSCULAR | Status: DC | PRN

## 2017-03-27 MED ORDER — HYDRALAZINE HCL 20 MG/ML IJ SOLN
INTRAMUSCULAR | Status: DC | PRN
  Administered 2017-03-27 (×3): 10 mg via INTRAVENOUS

## 2017-03-27 MED ORDER — LIDOCAINE-PRILOCAINE 2.5-2.5 % EX CREA: 1.0000 "application " | TOPICAL_CREAM | CUTANEOUS | Status: DC | PRN

## 2017-03-27 MED ORDER — LIDOCAINE HCL (PF) 1 % IJ SOLN
INTRAMUSCULAR | Status: AC
  Filled 2017-03-27: qty 30

## 2017-03-27 MED ORDER — HYDRALAZINE HCL 20 MG/ML IJ SOLN
INTRAMUSCULAR | Status: AC
  Filled 2017-03-27: qty 1

## 2017-03-27 MED ORDER — HEPARIN SODIUM (PORCINE) 1000 UNIT/ML IJ SOLN
INTRAMUSCULAR | Status: AC
  Filled 2017-03-27: qty 1

## 2017-03-27 MED ORDER — SODIUM CHLORIDE 0.9% FLUSH
3.0000 mL | Freq: Two times a day (BID) | INTRAVENOUS | Status: DC
  Administered 2017-03-27 – 2017-03-29 (×5): 3 mL via INTRAVENOUS

## 2017-03-27 MED ORDER — SODIUM CHLORIDE 0.9% FLUSH: 3.0000 mL | INTRAVENOUS | Status: DC | PRN

## 2017-03-27 MED ORDER — HEPARIN SODIUM (PORCINE) 1000 UNIT/ML DIALYSIS: 1000.0000 [IU] | INTRAMUSCULAR | Status: DC | PRN

## 2017-03-27 MED ORDER — ASPIRIN EC 81 MG PO TBEC
81.0000 mg | DELAYED_RELEASE_TABLET | Freq: Every day | ORAL | Status: DC
  Administered 2017-03-27 – 2017-03-29 (×3): 81 mg via ORAL
  Filled 2017-03-27 (×3): qty 1

## 2017-03-27 MED ORDER — VERAPAMIL HCL 2.5 MG/ML IV SOLN
INTRAVENOUS | Status: AC
  Filled 2017-03-27: qty 2

## 2017-03-27 MED ORDER — IODIXANOL 320 MG/ML IV SOLN
INTRAVENOUS | Status: DC | PRN
  Administered 2017-03-27: 90 mL via INTRA_ARTERIAL

## 2017-03-27 MED ORDER — HEPARIN (PORCINE) IN NACL 2-0.9 UNIT/ML-% IJ SOLN
INTRAMUSCULAR | Status: AC | PRN
  Administered 2017-03-27: 1000 mL via INTRA_ARTERIAL

## 2017-03-27 SURGICAL SUPPLY — 20 items
BAG SNAP BAND KOVER 36X36 (MISCELLANEOUS) ×1 IMPLANT
CATH OMNI FLUSH 5F 65CM (CATHETERS) ×1 IMPLANT
CATH SOFT-VU 4F 65 STRAIGHT (CATHETERS) IMPLANT
CATH SOFT-VU STRAIGHT 4F 65CM (CATHETERS) ×2
COVER DOME SNAP 22 D (MISCELLANEOUS) ×1 IMPLANT
COVER PRB 48X5XTLSCP FOLD TPE (BAG) IMPLANT
COVER PROBE 5X48 (BAG) ×2
KIT MICROINTRODUCER STIFF 5F (SHEATH) ×1 IMPLANT
KIT PV (KITS) ×2 IMPLANT
LUBRICANT VIPERSLIDE CORONARY (MISCELLANEOUS) ×1 IMPLANT
SHEATH PINNACLE 5F 10CM (SHEATH) ×1 IMPLANT
SHEATH PINNACLE 7F 10CM (SHEATH) ×1 IMPLANT
SHEATH PINNACLE 8F 10CM (SHEATH) ×1 IMPLANT
SHEATH PINNACLE MP 7F 45CM (SHEATH) ×1 IMPLANT
SYR MEDRAD MARK V 150ML (SYRINGE) ×2 IMPLANT
TRANSDUCER W/STOPCOCK (MISCELLANEOUS) ×2 IMPLANT
TRAY PV CATH (CUSTOM PROCEDURE TRAY) ×2 IMPLANT
WIRE AMPLATZ SS-J .035X180CM (WIRE) ×1 IMPLANT
WIRE BENTSON .035X145CM (WIRE) ×1 IMPLANT
WIRE ROSEN-J .035X260CM (WIRE) ×1 IMPLANT

## 2017-03-27 NOTE — Progress Notes (Signed)
PROGRESS NOTE  [name] DGL:[id number] DOB: 20-Feb-1976 DOA: 03/24/2017 PCP: Merrilee Seashore, MD  HPI/Recap of past 25 hours: 41 year old female with history of hypertension, diabetes, CAD, ESRD, presented with worsening right lower leg/foot pain, swelling and erythema.  Patient was found to have nonhealing diabetic foot ulcer with possible cellulitis and was started on IV antibiotics.  Today, patient denied any new complaints, denies chest pain, shortness of breath, cough, abdominal pain, nausea/vomiting, diarrhea, fever/chills.  Assessment/Plan: Principal Problem:   Cellulitis in diabetic foot ([place]) Active Problems:   End stage renal disease on dialysis ([place])   Coronary artery disease   DM (diabetes mellitus), type 2 with renal complications (HCC)   Hypertension   Hypertensive urgency   Cellulitis   Chronic pain   Diabetic ulcer of lower leg (HCC)   Gangrene of toe of right foot (HCC)   Non-healing ulcer (HCC)  #Nonhealing diabetic foot ulcer with possible cellulitis Stable X-ray showed multiple acute and old fractures with multiple lung and scabs on bilateral feet and toes MRI showed acute fracture of the first metatarsal and subacute fracture of the second and fifth metatarsal of the right foot.  No strong evidence of acute osteomyelitis ABI showed noncompressible vessels likely due to severe peripheral vascular disease Vascular surgery performed an angiogram which showed extensive severe calcification.  Procedure was aborted due to bleeding from the left groin due to difficulty exchanging that sheath due to severe calcification Patient might be a candidate for atherectomy and angioplasty Await further recs from vascular Continue IV Vanco, ceftriaxone and Flagyl Local wound care  #End-stage renal disease On hemodialysis Monday, Wednesday, Friday Nephrology following  #Hypertensive urgency Stable Continue amlodipine, clonidine, hydralazine, labetalol,  imdur  #Anemia of chronic kidney disease Continue ESA during dialysis  #Thrombocytopenia Unknown etiology CBC  #Diabetes Continue current insulin regimen   Code Status: Full  Family Communication: None at bedside  Disposition Plan: Home once stable   Consultants:  Nephrology  Orthopedics  ID  Procedures:  Dialysis  Antimicrobials:  IV ceftriaxone V vancomycin P.o. metronidazole    DVT prophylaxis: Heparin   Objective: Vitals:   03/27/17 1503 03/27/17 1547 03/27/17 1705 03/27/17 2127  BP: (!) 167/63 (!) 176/68 (!) 187/75 (!) 189/77  Pulse:   74 67  Resp: 16 16 18 19   Temp:   98.2 F (36.8 C) (!) 97.5 F (36.4 C)  TempSrc:   Oral Oral  SpO2: 94% 94% 92% 95%  Weight:    96.3 kg (212 lb 4.8 oz)  Height:        Intake/Output Summary (Last 24 hours) at 03/27/17 2257 Last data filed at 03/27/17 2222  Gross per 24 hour  Intake                0 ml  Output                0 ml  Net                0 ml   Filed Weights   03/26/17 1200 03/26/17 2119 03/27/17 2127  Weight: 93.3 kg (205 lb 11 oz) 93.4 kg (205 lb 14.6 oz) 96.3 kg (212 lb 4.8 oz)    Exam:   General: Alert, awake, oriented x3  Cardiovascular: S1-S2 present, no added heart sounds  Respiratory: Clear to auscultation bilaterally  Abdomen: Soft, nontender, nondistended  Musculoskeletal: Left forearm fistula intact  Skin: Multiple superficial lesions on the shins bilaterally  Psychiatry: Normal mood  Data Reviewed: CBC:  Recent Labs Lab 03/24/17 0032 03/24/17 0409 03/25/17 0221 03/26/17 0341 03/27/17 0418  WBC 11.2* 11.4* 7.9 7.8 6.0  NEUTROABS 9.5* 9.1*  --   --   --   HGB 10.6* 10.5* 10.3* 10.3* 10.3*  HCT 34.2* 32.2* 33.1* 33.2* 34.5*  MCV 91.7 92.0 93.2 93.0 94.5  PLT 121* 109* 94* 87* 119*   Basic Metabolic Panel:  Recent Labs Lab 03/24/17 0032 03/24/17 0409 03/25/17 0800 03/26/17 0341 03/27/17 0418  NA 138 137 136 136 135  K 4.8 4.9 6.1* 5.1 4.6  CL  96* 98* 95* 94* 93*  CO2 26 22 23 24 29   GLUCOSE 152* 85 189* 88 114*  BUN 58* 60* 78* 56* 42*  CREATININE 7.38* 7.49* 8.25* 6.57* 5.28*  CALCIUM 8.7* 8.5* 8.3* 8.3* 8.4*  PHOS  --   --  10.6*  --   --    GFR: Estimated Creatinine Clearance: 18.2 mL/min (A) (by C-G formula based on SCr of 5.28 mg/dL (H)). Liver Function Tests:  Recent Labs Lab 03/24/17 0032 03/25/17 0800  AST 18  --   ALT 15  --   ALKPHOS 236*  --   BILITOT 1.0  --   PROT 6.8  --   ALBUMIN 3.4* 3.0*   No results for input(s): LIPASE, AMYLASE in the last 168 hours. No results for input(s): AMMONIA in the last 168 hours. Coagulation Profile:  Recent Labs Lab 03/27/17 0418  INR 1.13   Cardiac Enzymes: No results for input(s): CKTOTAL, CKMB, CKMBINDEX, TROPONINI in the last 168 hours. BNP (last 3 results) No results for input(s): PROBNP in the last 8760 hours. HbA1C: No results for input(s): HGBA1C in the last 72 hours. CBG:  Recent Labs Lab 03/26/17 1631 03/26/17 2117 03/27/17 0804 03/27/17 1354 03/27/17 1705  GLUCAP 131* 142* 109* 94 250*   Lipid Profile: No results for input(s): CHOL, HDL, LDLCALC, TRIG, CHOLHDL, LDLDIRECT in the last 72 hours. Thyroid Function Tests: No results for input(s): TSH, T4TOTAL, FREET4, T3FREE, THYROIDAB in the last 72 hours. Anemia Panel: No results for input(s): VITAMINB12, FOLATE, FERRITIN, TIBC, IRON, RETICCTPCT in the last 72 hours. Urine analysis:    Component Value Date/Time   COLORURINE YELLOW 02/02/2017 1123   APPEARANCEUR CLOUDY (A) 02/02/2017 1123   LABSPEC 1.008 02/02/2017 1123   PHURINE 9.0 (H) 02/02/2017 1123   GLUCOSEU >=500 (A) 02/02/2017 1123   HGBUR NEGATIVE 02/02/2017 1123   BILIRUBINUR NEGATIVE 02/02/2017 1123   KETONESUR NEGATIVE 02/02/2017 1123   PROTEINUR >=300 (A) 02/02/2017 1123   UROBILINOGEN 0.2 02/08/2011 1812   NITRITE NEGATIVE 02/02/2017 1123   LEUKOCYTESUR NEGATIVE 02/02/2017 1123   Sepsis  Labs: @LABRCNTIP (procalcitonin:4,lacticidven:4)  ) Recent Results (from the past 240 hour(s))  Blood Cultures x 2 sites     Status: None (Preliminary result)   Collection Time: 03/24/17  4:20 AM  Result Value Ref Range Status   Specimen Description BLOOD RIGHT HAND  Final   Special Requests   Final    BOTTLES DRAWN AEROBIC AND ANAEROBIC Blood Culture adequate volume   Culture NO GROWTH 3 DAYS  Final   Report Status PENDING  Incomplete  Blood Cultures x 2 sites     Status: None (Preliminary result)   Collection Time: 03/24/17  4:40 AM  Result Value Ref Range Status   Specimen Description BLOOD RIGHT FOREARM  Final   Special Requests   Final    BOTTLES DRAWN AEROBIC AND ANAEROBIC Blood Culture adequate volume  Culture NO GROWTH 3 DAYS  Final   Report Status PENDING  Incomplete  MRSA PCR Screening     Status: None   Collection Time: 03/24/17  6:51 AM  Result Value Ref Range Status   MRSA by PCR NEGATIVE NEGATIVE Final    Comment:        The GeneXpert MRSA Assay (FDA approved for NASAL specimens only), is one component of a comprehensive MRSA colonization surveillance program. It is not intended to diagnose MRSA infection nor to guide or monitor treatment for MRSA infections.       Studies: No results found.  Scheduled Meds: . amLODipine  10 mg Oral Daily  . aspirin EC  81 mg Oral Daily  . atorvastatin  40 mg Oral q1800  . cinacalcet  60 mg Oral Q supper  . cloNIDine  0.3 mg Oral TID  . cyclobenzaprine  10 mg Oral QHS  . darbepoetin (ARANESP) injection - DIALYSIS  200 mcg Intravenous Q Wed-HD  . doxercalciferol  7 mcg Intravenous Q M,W,F-HD  . doxycycline  100 mg Oral Q12H  . feeding supplement (NEPRO CARB STEADY)  237 mL Oral BID BM  . feeding supplement (PRO-STAT SUGAR FREE 64)  30 mL Oral BID  . ferric citrate  630 mg Oral TID WC  . [START ON 03/28/2017] heparin  2,000 Units Dialysis Once in dialysis  . heparin  5,000 Units Subcutaneous Q8H  . hydrALAZINE  100  mg Oral Q8H  . insulin aspart  0-5 Units Subcutaneous QHS  . insulin aspart  0-9 Units Subcutaneous TID WC  . insulin detemir  20 Units Subcutaneous Daily  . isosorbide mononitrate  120 mg Oral Daily  . labetalol  300 mg Oral TID  . multivitamin  1 tablet Oral QHS  . nitroGLYCERIN  0.2 mg Transdermal Daily  . pentoxifylline  400 mg Oral TID WC  . sevelamer carbonate  4,800 mg Oral TID WC  . sodium chloride flush  3 mL Intravenous Q12H  . sodium chloride flush  3 mL Intravenous Q12H    Continuous Infusions: . sodium chloride 250 mL (03/24/17 0409)  . sodium chloride    . sodium chloride    . sodium chloride    . sodium chloride    . sodium chloride       LOS: 3 days     Alma Friendly, MD [facility]   If 7PM-7AM, please contact night-coverage [email or web address] Password [email or web address] 03/27/2017, 10:57 PM

## 2017-03-27 NOTE — Plan of Care (Signed)
Problem: Pain Managment: Goal: General experience of comfort will improve Outcome: Progressing Pt reports pain in foot is manageable but right foot noted to have increased swelling over the last couple of days.

## 2017-03-27 NOTE — Progress Notes (Signed)
   Daily Progress Note  This patient's angiogram is remarkable for extensive severe calcification.  Her aortoiliac segments were calcified enough to limit placement of a working sheath for a retrograde approach to the right SFA disease.  Both SFA have disease that likely will cause hemodynamic flow restrictions in both legs.  Both legs have small tibial arteries.  - Pt might be a candidate for R SFA orbital atherectomy and angioplasty if antegrade access can be obtained. - Similarly left SFA may be a candidate for the same. - I suspect that antegrade access may be difficult on both sides. - This procedure was aborted due to bleeding from the L groin likely related to the difficulty exchanging sheath in the L CFA due to severe calcification.   Adele Barthel, MD, FACS [facility] Office: [phone] Pager: [phone]  03/27/2017, 2:01 PM

## 2017-03-27 NOTE — Progress Notes (Signed)
Dialysis:  [facility] MWF 4h 70min   89kg   2/2   L AVF   Hep 2000 -Hectorol 47mcg IV tiw -Venofer 50mg  IV q Wed -Mircera 200 mcg IV q 2 weeks (last dosed 10/17) BMM: Auryixa 3 tabs qac, Renvela 6 qac, 2 q snacks, Sensipar 60 qd   [facility] Progress Note   Assessment: 1. Nonhealing diabetic foot ulcers w/ cellulitis - on IV abx. Seen by [facility], going for angiogram today. 2. ESRD -  MWF HD 3. HTN/volume - BP's on high side, fluid indiscreation. Up only 4kg yest after HD which is much better, hopefully will stay down.  4. Anemia  - Hgb 10.5, Next ESA due 10/31  5. Metabolic bone disease -  Cont VDRA/binders  6. Nutrition - Renal diet/vitamins/prostat for low albumin  7. DM- per primary  8. Thrombocytopenia - hold heparin next HD  9. H/o ascites and hx of paracentesis   Plan -  HD Friday ,encouraging fluid restriction   Kelly Splinter MD [place] pager [phone]   03/26/2017, 10:53 AM     Subjective: no c/o.    Objective Vitals:   03/26/17 2119 03/27/17 0432 03/27/17 0807 03/27/17 1134  BP: (!) 195/80 (!) 192/82 (!) 186/90   Pulse: 68 64 60   Resp: 18 18 18    Temp: 97.7 F (36.5 C) 97.8 F (36.6 C) (!) 97.5 F (36.4 C)   TempSrc: Oral Oral Oral   SpO2: 94% 95% 93% 94%  Weight: 93.4 kg (205 lb 14.6 oz)     Height:       Physical Exam General: WNWD female NAD  Heart: RRR 2/6 SEM  Lungs: CTAB Abdomen: distended with fluid wave  Extremities: bilat foot ulcers, R foot / ankle mild erythema, not really tender today Dialysis Access: LUE AVF cannulated on HD  Additional Objective Labs: Basic Metabolic Panel:  Recent Labs Lab 03/25/17 0800 03/26/17 0341 03/27/17 0418  NA 136 136 135  K 6.1* 5.1 4.6  CL 95* 94* 93*  CO2 23 24 29   GLUCOSE 189* 88 114*  BUN 78* 56* 42*  CREATININE 8.25* 6.57* 5.28*  CALCIUM 8.3* 8.3* 8.4*  PHOS 10.6*  --   --    Liver Function Tests:  Recent Labs Lab 03/24/17 0032 03/25/17 0800  AST 18   --   ALT 15  --   ALKPHOS 236*  --   BILITOT 1.0  --   PROT 6.8  --   ALBUMIN 3.4* 3.0*   No results for input(s): LIPASE, AMYLASE in the last 168 hours. CBC:  Recent Labs Lab 03/24/17 0032 03/24/17 0409 03/25/17 0221 03/26/17 0341 03/27/17 0418  WBC 11.2* 11.4* 7.9 7.8 6.0  NEUTROABS 9.5* 9.1*  --   --   --   HGB 10.6* 10.5* 10.3* 10.3* 10.3*  HCT 34.2* 32.2* 33.1* 33.2* 34.5*  MCV 91.7 92.0 93.2 93.0 94.5  PLT 121* 109* 94* 87* 112*   Blood Culture    Component Value Date/Time   SDES BLOOD RIGHT FOREARM 03/24/2017 0440   SPECREQUEST  03/24/2017 0440    BOTTLES DRAWN AEROBIC AND ANAEROBIC Blood Culture adequate volume   CULT NO GROWTH 2 DAYS 03/24/2017 0440   REPTSTATUS PENDING 03/24/2017 0440    Cardiac Enzymes: No results for input(s): CKTOTAL, CKMB, CKMBINDEX, TROPONINI in the last 168 hours. CBG:  Recent Labs Lab 03/25/17 2220 03/26/17 1312 03/26/17 1631 03/26/17 2117 03/27/17 0804  GLUCAP 122* 108* 131* 142* 109*   Iron  Studies: No results for input(s): IRON, TIBC, TRANSFERRIN, FERRITIN in the last 72 hours. Lab Results  Component Value Date   INR 1.13 03/27/2017   INR 1.24 02/15/2016   INR 1.14 02/15/2015   Medications: . [MAR Hold] sodium chloride 250 mL (03/24/17 0409)  . [MAR Hold] sodium chloride    . [MAR Hold] sodium chloride     . [MAR Hold] amLODipine  10 mg Oral Daily  . [MAR Hold] atorvastatin  40 mg Oral q1800  . [MAR Hold] cinacalcet  60 mg Oral Q supper  . [MAR Hold] cloNIDine  0.3 mg Oral TID  . [MAR Hold] cyclobenzaprine  10 mg Oral QHS  . [MAR Hold] darbepoetin (ARANESP) injection - DIALYSIS  200 mcg Intravenous Q Wed-HD  . [MAR Hold] doxercalciferol  7 mcg Intravenous Q M,W,F-HD  . [MAR Hold] doxycycline  100 mg Oral Q12H  . [MAR Hold] feeding supplement (NEPRO CARB STEADY)  237 mL Oral BID BM  . [MAR Hold] feeding supplement (PRO-STAT SUGAR FREE 64)  30 mL Oral BID  . [MAR Hold] ferric citrate  630 mg Oral TID WC  . [MAR  Hold] heparin  5,000 Units Subcutaneous Q8H  . [MAR Hold] hydrALAZINE  100 mg Oral Q8H  . [MAR Hold] insulin aspart  0-5 Units Subcutaneous QHS  . [MAR Hold] insulin aspart  0-9 Units Subcutaneous TID WC  . [MAR Hold] insulin detemir  20 Units Subcutaneous Daily  . [MAR Hold] isosorbide mononitrate  120 mg Oral Daily  . [MAR Hold] labetalol  300 mg Oral TID  . [MAR Hold] multivitamin  1 tablet Oral QHS  . [MAR Hold] nitroGLYCERIN  0.2 mg Transdermal Daily  . [MAR Hold] pentoxifylline  400 mg Oral TID WC  . [MAR Hold] sevelamer carbonate  4,800 mg Oral TID WC  . [MAR Hold] sodium chloride flush  3 mL Intravenous Q12H

## 2017-03-27 NOTE — Interval H&P Note (Signed)
Addendum   I discussed with the patient the nature of angiographic procedures, especially the limited patencies of any endovascular intervention.    The patient is aware of that the risks of an angiographic procedure include but are not limited to: bleeding, infection, access site complications, renal failure, embolization, rupture of vessel, dissection, arteriovenous fistula, possible need for emergent surgical intervention, possible need for surgical procedures to treat the patient's pathology, anaphylactic reaction to contrast, and stroke and death.    The patient is aware of the risks and agrees to proceed.  Adele Barthel, MD, FACS [facility] Office: [phone] Pager: [phone]  03/27/2017, 9:33 AM

## 2017-03-27 NOTE — Progress Notes (Signed)
Carotid duplex  has been completed. Preliminary results can be found: Chart review -> CV Proc  Landry Mellow, RDMS, RVT

## 2017-03-27 NOTE — H&P (View-Only) (Signed)
Hospital Consult    Reason for Consult:  Non healing wound bilateral toes Requesting Physician:  Carolin Sicks  MRN #:  [id number]  History of Present Illness: This is a 41 y.o. female who states that she had a blood blister presenton the 4th toe on both feet.  On Saturday evening, her dog, Rambo and 50lb bloodhound mix stepped in her right foot and tore the skin off of the 4th toe.   She states that she developed pain in her right foot that ascended up her leg the next day.   She states that she has had wounds in the past, but no trouble with them healing.  She does have neuropathy of her feet.  She does have scabs present as well.   She presented to the ED yesterday with increasing redness, leg pain and swelling.   She did have a mild leukocytosis, but was afebrile.    Dr. Sharol Given was consulted and recommended a vascular consult.  She was started on a NTG patch and [place] to improve the microcirculation in her foot.  He is uncertain if she can heal a BKA.  Pt was also seen by infectious disease and was placed on broad spectrum abx.  MRI is pending.  She is ESRD and dialyzes M/W/F at the [facility] location in [place].  She dialyzes via a left RC AVF.  She states it has had revisions in the past.  It was originally created at [facility].  Her K+ today is 6.1 and will go to HD tomorrow per nephrology.    She does have thrombocytopenia with a platelet count on admission of 121k and is 94k today.  The pt is on a statin for cholesterol management.  She is on a CCB, beta blocker and catapres for blood pressure management.  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  She denies hx of MI, chest pain, CVA.  She states that she had an ultrasound of her neck a couple of years ago. She quit smoking 2.5 years ago.  Past Medical History:  Diagnosis Date  . Chronic anemia    2nd to renal disease  . CIN III (cervical intraepithelial neoplasia grade III) with severe dysplasia    S/P LEEP AND CONE  . Coronary artery disease    Status post  cardiac catheterization June 2012 scattered coronary artery disease/atherosclerosis with 70-80% stenosis in a small right PDA.  . Diabetic Charcot's joint disease ([place])   . DM (diabetes mellitus) ([place])    Long-term insulin  . End stage renal disease on dialysis ([place]) 05/02/11   [facility]; M; W, F; last time 05/01/11  . Fracture of 5th metatarsal 2016   Right  . Gastroparesis   . 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 History of abscesses in groins 12/06/2010  . Hyperlipidemia    Hypertriglyceridemia 449 HDL 25  . Hypertension   . Migraines    "just on dialysis days"  . Peripheral neuropathy    related to DM  . Peripheral vascular disease ([place])    Tibial occlusive disease evaluated by Dr. Kellie Simmering in August 2011. Medical therapy  . Renal insufficiency    Dialysis since 2012  . Tobacco use disorder    Discontinued March 2012  . Trimalleolar fracture of ankle, closed 02/09/2015   Right    Past Surgical History:  Procedure Laterality Date  . AV FISTULA PLACEMENT  04/2010  . CARDIAC CATHETERIZATION [name] 02/10/2015   Procedure: Left Heart Cath and Coronary Angiography;  Surgeon: Dixie Dials, MD;  Location: [place] [facility];  Service: Cardiovascular;  Laterality: [name];  . CARDIAC CATHETERIZATION [name] 02/21/2016   Procedure: Right Heart Cath;  Surgeon: Jolaine Artist, MD;  Location: [facility];  Service: Cardiovascular;  Laterality: [name];  . CERVICAL BIOPSY  W/ LOOP ELECTRODE EXCISION     h/o  . CERVICAL CONE BIOPSY     h/o  . COLPOSCOPY    . DILATION AND CURETTAGE OF UTERUS  2009  . ORIF ANKLE FRACTURE Right 02/09/2015  . ORIF ANKLE FRACTURE Right 02/16/2015   Procedure: OPEN REDUCTION INTERNAL FIXATION (ORIF) RIGHT ANKLE FRACTURE;  Surgeon: Altamese Le Flore, MD;  Location: [place];  Service: Orthopedics;  Laterality: Right;    Allergies  Allergen Reactions  . Cephalexin Other (See Comments)    Reaction unknown- Childhood allergy Tolerated Ceftriaxone in the past  .  Sulfamethoxazole-Trimethoprim Other (See Comments)    Unknown reaction. Pt states that she was told by her mother that she had allergy to Bactrim as a child.    Prior to Admission medications   Medication Sig Start Date End Date Taking? Authorizing Provider  amLODipine (NORVASC) 10 MG tablet Take 1 tablet (10 mg total) by mouth daily. 02/24/16  Yes Tat, Shanon Brow, MD  amoxicillin-clavulanate (AUGMENTIN) 500-125 MG tablet Take 1 tablet (500 mg total) by mouth daily. X 7 days, then on Mon-Wed-Fri only after dialysis 02/24/16  Yes Tat, David, MD  atorvastatin (LIPITOR) 40 MG tablet Take 40 mg by mouth daily.   Yes [provider]  cloNIDine (CATAPRES) 0.3 MG tablet Take 1 tablet (0.3 mg total) by mouth 3 (three) times daily. 11/01/14  Yes Debbe Odea, MD  cyclobenzaprine (FLEXERIL) 10 MG tablet Take 1 tablet (10 mg total) by mouth at bedtime. Patient taking differently: Take 10 mg by mouth at bedtime as needed for muscle spasms.  03/12/17  Yes Magnus Sinning, MD  Ferric Citrate (AURYXIA PO) Take 2 tablets by mouth 3 (three) times daily with meals.   Yes [provider]  HUMALOG MIX 50/50 KWIKPEN (50-50) 100 UNIT/ML Kwikpen Inject 26 Units into the skin 2 (two) times daily.  04/29/14  Yes [provider]  hydrALAZINE (APRESOLINE) 100 MG tablet Take 1 tablet (100 mg total) by mouth every 8 (eight) hours. 02/24/16  Yes Tat, Shanon Brow, MD  HYDROcodone-acetaminophen (NORCO) 5-325 MG tablet Take 1 tablet by mouth every 6 (six) hours as needed for moderate pain. 02/02/17  Yes Kirichenko, Tatyana, PA-C  insulin lispro (HUMALOG) 100 UNIT/ML injection Inject 6 Units into the skin 3 (three) times daily before meals.   Yes [provider]  isosorbide mononitrate (IMDUR) 120 MG 24 hr tablet Take 1 tablet (120 mg total) by mouth daily. 02/25/16  Yes Tat, Shanon Brow, MD  labetalol (NORMODYNE) 300 MG tablet Take 1 tablet (300 mg total) by mouth 3 (three) times daily. 11/01/14  Yes Debbe Odea, MD    RENVELA 800 MG tablet Take 4,800 mg by mouth 3 (three) times daily with meals.  04/17/13  Yes [provider]  traMADol (ULTRAM) 50 MG tablet Take 1 tablet (50 mg total) by mouth every 6 (six) hours as needed for moderate pain (or Headache unrelieved by tylenol). 02/24/16  Deveron Furlong, MD    Social History   Social History  . Marital status: Married    Spouse name: [name]  . Number of children: [name]  . Years of education: [name]   Occupational History  .  [employer]   Social History Main Topics  .  Smoking status: Former Smoker    Packs/day: 0.30    Years: 10.00    Types: Cigarettes    Quit date: 08/05/2011  . Smokeless tobacco: Never Used     Comment: "quit smoking cigarettes 07/2010"  . Alcohol use No  . Drug use: No  . Sexual activity: Not on file   Other Topics Concern  . Not on file   Social History Narrative  . No narrative on file     Family History  Problem Relation Age of Onset  . Diabetes Mother   . Hypertension Mother   . Diabetes Father   . Hyperlipidemia Father   . Hypertension Father   . Diabetes Sister   . Stroke Maternal Grandmother   . Diabetes Sister   . Breast cancer Maternal Aunt        Age 22's    ROS: [x]  Positive   [ ]  Negative   [ ]  All sytems reviewed and are negative  Cardiac: []  chest pain/pressure []  palpitations []  SOB lying flat []  DOE  Vascular: []  pain in legs while walking []  pain in legs at rest []  pain in legs at night []  non-healing ulcers []  hx of DVT []  swelling in legs  Pulmonary: []  productive cough []  asthma/wheezing []  home O2  Neurologic: []  weakness in []  arms []  legs []  numbness in []  arms []  legs []  hx of CVA []  mini stroke [] difficulty speaking or slurred speech []  temporary loss of vision in one eye []  dizziness [x]  hx migraines  Hematologic: []  hx of cancer []  bleeding problems []  problems with blood clotting easily [x]  anemia of chronic disease [x]  thrombocytopenia  Endocrine:    [x]  diabetes []  thyroid disease  GI []  vomiting blood []  blood in stool  GU: [x]  CKD/renal failure [x]  HD--[x]  M/W/F or []  T/T/S []  burning with urination []  blood in urine  Psychiatric: []  anxiety []  depression  Musculoskeletal: []  arthritis []  joint pain  Integumentary: []  rashes []  ulcers  Constitutional: []  fever []  chills   Physical Examination  Vitals:   03/25/17 1101 03/25/17 1148  BP: (!) 183/78 (!) 201/90  Pulse: 70 70  Resp: 16 17  Temp: (!) 97.4 F (36.3 C) 98.2 F (36.8 C)  SpO2: 94% 95%   Body mass index is 26.14 kg/m.    General:  WDWN in NAD Gait: Not observed HENT: WNL, normocephalic Pulmonary: normal non-labored breathing, without Rales, rhonchi,  wheezing Cardiac: regular, without  Murmurs, rubs or gallops; with left carotid bruit Abdomen:  soft, NT/ND, no masses Skin: without rashes Vascular Exam/Pulses:  Right Left  Radial 2+ (normal) 2+ (normal)  Femoral 2+ (normal) 2+ (normal)  Popliteal Unable to palpate  Unable to palpate   DP Unable to palpate  Unable to palpate   PT Unable to palpate  Unable to palpate    Extremities: left foot warmer than right;   Right foot.  Left foot    Musculoskeletal: no muscle wasting or atrophy  Neurologic: A&O X 3;  No focal weakness or paresthesias are detected; speech is fluent/normal Psychiatric:  The pt has Normal affect. Lymph:  No inguinal lymphadenopathy   CBC    Component Value Date/Time   WBC 7.9 03/25/2017 0221   RBC 3.55 (L) 03/25/2017 0221   HGB 10.3 (L) 03/25/2017 0221   HCT 33.1 (L) 03/25/2017 0221   PLT 94 (L) 03/25/2017 0221   MCV 93.2 03/25/2017 0221   MCH 29.0 03/25/2017 0221   MCHC 31.1 03/25/2017 0221  RDW 17.6 (H) 03/25/2017 0221   LYMPHSABS 1.4 03/24/2017 0409   MONOABS 0.7 03/24/2017 0409   EOSABS 0.1 03/24/2017 0409   BASOSABS 0.0 03/24/2017 0409    BMET    Component Value Date/Time   NA 136 03/25/2017 0800   K 6.1 (H) 03/25/2017 0800   CL 95  (L) 03/25/2017 0800   CO2 23 03/25/2017 0800   GLUCOSE 189 (H) 03/25/2017 0800   BUN 78 (H) 03/25/2017 0800   CREATININE 8.25 (H) 03/25/2017 0800   CALCIUM 8.3 (L) 03/25/2017 0800   GFRNONAA 5 (L) 03/25/2017 0800   GFRAA 6 (L) 03/25/2017 0800    COAGS: Lab Results  Component Value Date   INR 1.24 02/15/2016   INR 1.14 02/15/2015   INR 1.09 02/09/2015     Non-Invasive Vascular Imaging:   ABI'S 03/24/17: Final Interpretation: Right: Resting right ankle-brachial index indicates noncompressible right lower extremity arteries.The right toe-brachial index is abnormal. Left: Resting left ankle-brachial index indicates noncompressible right lower extremity arteries.The left toe-brachial index is normal.  Statin:  Yes.   Beta Blocker:  Yes.   Aspirin:  No. ACEI:  No. ARB:  No. CCB use:  Yes Other antiplatelets/anticoagulants:  Yes.   SQ heparin   ASSESSMENT/PLAN: This is a 41 y.o. female with ESRD and IDDM with non healing wounds bilateral feet.   -pt ABI's reveal non compressible vessels bilaterally.  She does have non healing wounds bilateral feet (see above pictures) the left foot is warmer than the right.  I cannot palpate pulses and there is no doppler available.  She will need arteriogram to evaluate arterial anatomy with possible intervention.  Of note, she does have thrombocytopenia with platelet count today of 94k. -antibiotics per infectious disease -MRI pending -she does have a right carotid bruit-will order carotid duplex. -Dr. Trula Slade to see pt this afternoon.   [name], PA-C [facility] [phone] I agree with the above.  I have seen and evaluated the patient.  She has bilateral nonhealing wounds with diabetes.  She does not have palpable pedal pulses.  In order to attempt limb salvage, she will need arteriogram to determine if she has any options for revascularization.  The right leg appears to be bothering her the worst.  Therefore I  would plan on aortogram with bilateral runoff and possible intervention of the right leg if feasible.  She is on dialysis.  This will be scheduled for Thursday.  [name]

## 2017-03-27 NOTE — Op Note (Signed)
OPERATIVE NOTE   PROCEDURE: 1.  Left common femoral artery cannulation under ultrasound guidance 2.  Placement of catheter in aorta 3.  Aortogram 4.  Second order arterial selection 5.  Right leg runoff via catheter 6.  Left leg runoff via sheath 7.  Conscious sedation for 72 minutes  PRE-OPERATIVE DIAGNOSIS: bilateral foot gangrene  POST-OPERATIVE DIAGNOSIS: same as above   SURGEON: Adele Barthel, MD  ANESTHESIA: conscious sedation  ESTIMATED BLOOD LOSS: 50 cc  CONTRAST: 90 cc  FINDING(S):  Heavy calcification throughout entire arterial system  Aorta: patent, heavily calcified   Right Left  RA Patent, no nephrogram Patent, no nephrogram  CIA Patent, <30% stenosis proximally, heavily calcified Patent, <30% stenosis proximally, heavily calcified  EIA Patent, heavily calcified Patent, heavily calcified  IIA Patent, heavily calcified Patent, heavily calcified  CFA Patent, heavily calcified Patent, heavily calcified  SFA Patent, heavily calcified, extensive disease: Proximal 1/3 with 2 serial stenoses >50%, Middle with 50% stenosis, Distal 1/3 with 4 serial sub-total occlusion Patent, heavily calcified, proximal 1/3 with 50% stenosis and distal 1/3 with two serial 50-75% stenoses  PFA Patent Patent  Pop Patent: above-the-knee segment with sub-total occlusion patent  Trif Patent, small Patent, small  AT Patent, <1 mm at takeoff, dominant runoff Patent, <1 mm at takeoff, runoff  Pero Patent, <1 mm at takeoff Patent, <1 mm at takeoff  PT Patent, <1 mm at takeoff Patent, <1 mm at takeoff, runoff   SPECIMEN(S):  none  INDICATIONS:   [name] is a 41 y.o. female who presents with bilateral foot gangrene.  The patient presents for: aortogram bilateral leg runoff, and possible intervention.  I discussed with the patient the nature of angiographic procedures, especially the limited patencies of any endovascular intervention.  The patient is aware of that the risks of an  angiographic procedure include but are not limited to: bleeding, infection, access site complications, renal failure, embolization, rupture of vessel, dissection, possible need for emergent surgical intervention, possible need for surgical procedures to treat the patient's pathology, and stroke and death.  The patient is aware of the risks and agrees to proceed.  DESCRIPTION: After full informed consent was obtained from the patient, the patient was brought back to the angiography suite.  The patient was placed supine upon the angiography table and connected to cardiopulmonary monitoring equipment.  The patient was then given conscious sedation, the amounts of which are documented in the patient's chart.  A circulating radiologic technician maintained continuous monitoring of the patient's cardiopulmonary status.  Additionally, the control room radiologic technician provided backup monitoring throughout the procedure.  The patient was prepped and drape in the standard fashion for an angiographic procedure.  At this point, attention was turned to the left groin.  Under ultrasound guidance, the subcutaneous tissue surrounding the left common femoral artery was anesthesized with 1% lidocaine with epinephrine.  The artery was then cannulated with a micropuncture needle with some difficult due to diffuse calcification.  The microwire was advanced into the iliac arterial system.  The needle was exchanged for a microsheath, which was loaded into the common femoral artery over the wire.  The microwire was exchanged for a Bentson wire which was advanced into the aorta.  The microsheath was then exchanged for a 5-Fr sheath which was loaded into the common femoral artery.  The Omniflush catheter was then loaded over the wire up to the level of L1.  The catheter was connected to the power injector circuit.  After  de-airring and de-clotting the circuit, a power injector aortogram was completed.  The findings are listed  above.    Using a Bentson wire and Omniflush catheter, the right common iliac artery was selected.  The catheter and wire were advanced into the external iliac artery.  The wire was removed.  This wire was markedly curled by passage of it through this heavily calcified aortoiliac arterial system.  The catheter was connected to the power injector circuit.  An automated right leg runoff was completed.  The findings are listed above.  Based on the findings, I felt attempt at intervention was indicated.    The wire was exchanged for a Rosen wire.  There was difficulty passing the Rosen wire due to the heavy calcific of this patient's iliac arterial segment.  The patient's left femoral sheath was exchanged for a regular 7-Fr sheath initially as the 7-Fr Destination sheath would not advance.  After dilating the arteriotomy and subcutaneous tissue with the regular sheath, I could get the 7-Fr Destination sheath into the iliac arterial system.  The sheath could not advance past the aortic bifurcation.  I removed the dilator and then placed a straight catheter over the wire and then tried to  exchanged the wire for an Amplatz wire.  The Amplatz wire kicked the catheter back into the common iliac artery.  I replaced the Amplatz wire with a Bentson wire.  The sheath would NOT track over the wire, so I felt there was no way to do a right leg intervention via retrograde approach.    I pulled the sheath back into the left external iliac artery and then placed a Rosen wire.  The sheath was exchanged for the regular length 7-Fr sheath.  The dilator and wire were removed.  The sheath was aspirated.  No clots were present and the sheath was reloaded with heparinized saline.  The left femoral sheath was connected to the power injector circuit.  An automated left leg runoff was completed.  The findings are listed above.  At this point, I notice the patient was developing a moderate sized hematoma in the left groin.  I tried  exchange the left sheath for a 8-Fr sheath, but the sheath had problems going into the artery.  I suspect the heavy calcification in this left common femoral artery as the likely cause of the difficulty with sheath exchange.  I elected to pull the wire and sheath and hold the left common femoral artery under pressure for the next 20 minutes.  The left groin hematoma was expressed with this maneuver.  There was no further active bleeding.  The groin remained soft.  I felt that no further intervention was indicated at this time given the bleeding from the left groin.  Patient has extremely small tibial arteries that may not be easily dilated with endovascularly means.  Both femoropopliteal segment might be candidates for orbital atherectomy and angioplasty via antegrade cannulation, as a later date given the bleeding today.   COMPLICATIONS: left groin hematoma  CONDITION: same   Adele Barthel, MD, FACS [facility] Office: [phone] Pager: [phone]  03/27/2017, 1:28 PM

## 2017-03-27 NOTE — Interval H&P Note (Signed)
History and Physical Interval Note:  03/27/2017 9:32 AM  [name]  has presented today for surgery, with the diagnosis of pvd - ulcers  The various methods of treatment have been discussed with the patient and family. After consideration of risks, benefits and other options for treatment, the patient has consented to  Procedure(s): ABDOMINAL AORTOGRAM W/LOWER EXTREMITY ([name]) as a surgical intervention .  The patient's history has been reviewed, patient examined, no change in status, stable for surgery.  I have reviewed the patient's chart and labs.  Questions were answered to the patient's satisfaction.     Adele Barthel

## 2017-03-28 LAB — GLUCOSE, CAPILLARY
GLUCOSE-CAPILLARY: 342 mg/dL — AB (ref 65–99)
Glucose-Capillary: 208 mg/dL — ABNORMAL HIGH (ref 65–99)
Glucose-Capillary: 206 mg/dL — ABNORMAL HIGH (ref 65–99)
Glucose-Capillary: 194 mg/dL — ABNORMAL HIGH (ref 65–99)

## 2017-03-28 LAB — CBC WITH DIFFERENTIAL/PLATELET
Basophils Absolute: 0 10*3/uL (ref 0.0–0.1)
Basophils Relative: 1 %
Eosinophils Absolute: 0.1 10*3/uL (ref 0.0–0.7)
Eosinophils Relative: 2 %
HCT: 33.5 % — ABNORMAL LOW (ref 36.0–46.0)
Hemoglobin: 10.3 g/dL — ABNORMAL LOW (ref 12.0–15.0)
LYMPHS PCT: 19 %
Lymphs Abs: 1.2 10*3/uL (ref 0.7–4.0)
MCH: 28.8 pg (ref 26.0–34.0)
MCHC: 30.7 g/dL (ref 30.0–36.0)
MCV: 93.6 fL (ref 78.0–100.0)
MONO ABS: 0.4 10*3/uL (ref 0.1–1.0)
MONOS PCT: 7 %
NEUTROS ABS: 4.6 10*3/uL (ref 1.7–7.7)
NEUTROS PCT: 73 %
PLATELETS: 121 10*3/uL — AB (ref 150–400)
RBC: 3.58 MIL/uL — ABNORMAL LOW (ref 3.87–5.11)
RDW: 17.2 % — ABNORMAL HIGH (ref 11.5–15.5)
WBC: 6.4 10*3/uL (ref 4.0–10.5)

## 2017-03-28 LAB — BASIC METABOLIC PANEL
Anion gap: 14 (ref 5–15)
BUN: 58 mg/dL — AB (ref 6–20)
CO2: 29 mmol/L (ref 22–32)
CREATININE: 6.57 mg/dL — AB (ref 0.44–1.00)
Calcium: 8.9 mg/dL (ref 8.9–10.3)
Chloride: 92 mmol/L — ABNORMAL LOW (ref 101–111)
GFR calc Af Amer: 8 mL/min — ABNORMAL LOW (ref >60)
GFR, EST NON AFRICAN AMERICAN: 7 mL/min — AB (ref >60)
Glucose, Bld: 230 mg/dL — ABNORMAL HIGH (ref 65–99)
Potassium: 5.1 mmol/L (ref 3.5–5.1)
SODIUM: 135 mmol/L (ref 135–145)

## 2017-03-28 MED ORDER — ONDANSETRON HCL 4 MG/2ML IJ SOLN
INTRAMUSCULAR | Status: AC
  Filled 2017-03-28: qty 2

## 2017-03-28 MED ORDER — DOXERCALCIFEROL 4 MCG/2ML IV SOLN
INTRAVENOUS | Status: AC
  Administered 2017-03-28: 7 ug via INTRAVENOUS
  Filled 2017-03-28: qty 4

## 2017-03-28 NOTE — Progress Notes (Signed)
PROGRESS NOTE  [name]:[id number] DOB: [birth year] DOA: 03/24/2017 PCP: Merrilee Seashore, MD  HPI/Recap of past 92 hours: 41 year old female with a history of hypertension, diabetes, CAD, end-stage renal disease, peripheral vascular disease, presented with worsening right lower leg/foot pain, swelling and erythema.  Patient was found to have nonhealing diabetic foot ulcer with possible cellulitis and was started on antibiotics  Today, patient denies any new complaints, denies any chest pain, shortness of breath, cough, abdominal pain, nausea/vomiting, diarrhea, fever/chills.   Assessment/Plan: Principal Problem:   Cellulitis in diabetic foot ([name]   End stage renal disease on dialysis [facility])   Coronary artery disease   DM (diabetes mellitus), type 2 with renal complications (HCC)   Hypertension   Hypertensive urgency   Cellulitis   Chronic pain   Diabetic ulcer of lower leg (HCC)   Gangrene of toe of right foot (HCC)   Non-healing ulcer (HCC)  #Nonhealing diabetic foot ulcer with possible cellulitis Stable X-ray showed multiple acute and old fractures with multiple long and scabs on bilateral feet and toes MRI showed acute fracture of the first metatarsal and subacute fracture of the second and fifth metatarsal of the right foot No strong evidence of acute osteomyelitis ABI showed severe peripheral vascular disease Vascular surgery performed an angiogram on 03/27/17 which showed extensive severe calcification.  Procedure was aborted due to bleeding from the left groin due to difficulty exchanging sheath due to severe calcification Patient might be a candidate for atherectomy and angioplasty Awaiting further records from vascular Continue p.o. Doxycycline  #End-stage renal disease On hemodialysis M/W/F Nephrology following  #Hypertensive urgency Stable Continue amlodipine, clonidine, hydralazine, labetalol, imdur  Diabetes Continue current  insulin regimen  #Anemia of chronic kidney disease Continue ESA during dialysis  #Thrombocytopenia Unknown etiology Follow-up as an outpatient    Code Status: Full  Family Communication: None at bedside  Disposition Plan: Home once stable   Consultants:  Nephrology  Orthopedics  ID  Procedures:  Lower extremity angiogram  Antimicrobials:  P.o. doxycycline  DVT prophylaxis: Heparin   Objective: Vitals:   03/28/17 1030 03/28/17 1100 03/28/17 1130 03/28/17 1704  BP: (!) 211/98 (!) 190/85 (!) 205/90 (!) 206/81  Pulse: 63 68 68 67  Resp:    18  Temp:    97.7 F (36.5 C)  TempSrc:    Oral  SpO2:    94%  Weight:      Height:        Intake/Output Summary (Last 24 hours) at 03/28/17 1840 Last data filed at 03/28/17 8119  Gross per 24 hour  Intake                0 ml  Output                0 ml  Net                0 ml   Filed Weights   03/26/17 2119 03/27/17 2127 03/28/17 0815  Weight: 93.4 kg (205 lb 14.6 oz) 96.3 kg (212 lb 4.8 oz) 97.1 kg (214 lb 1.1 oz)    Exam:   General: Alert, awake, oriented x3  Cardiovascular: S1-S2 present, no added heart sounds  Respiratory: Clear to auscultation bilaterally  Abdomen: Soft, nontender, nondistended  Musculoskeletal: Left forearm fistula intact  Skin: Multiple superficial lesions on the shins bilaterally  Psychiatry: Normal mood   Data Reviewed: CBC:  Recent Labs Lab 03/24/17 0032 03/24/17 0409 03/25/17 0221 03/26/17 0341  03/27/17 0418 03/28/17 0205  WBC 11.2* 11.4* 7.9 7.8 6.0 6.4  NEUTROABS 9.5* 9.1*  --   --   --  4.6  HGB 10.6* 10.5* 10.3* 10.3* 10.3* 10.3*  HCT 34.2* 32.2* 33.1* 33.2* 34.5* 33.5*  MCV 91.7 92.0 93.2 93.0 94.5 93.6  PLT 121* 109* 94* 87* 112* 242*   Basic Metabolic Panel:  Recent Labs Lab 03/24/17 0409 03/25/17 0800 03/26/17 0341 03/27/17 0418 03/28/17 0205  NA 137 136 136 135 135  K 4.9 6.1* 5.1 4.6 5.1  CL 98* 95* 94* 93* 92*  CO2 22 23 24 29 29     GLUCOSE 85 189* 88 114* 230*  BUN 60* 78* 56* 42* 58*  CREATININE 7.49* 8.25* 6.57* 5.28* 6.57*  CALCIUM 8.5* 8.3* 8.3* 8.4* 8.9  PHOS  --  10.6*  --   --   --    GFR: Estimated Creatinine Clearance: 14.6 mL/min (A) (by C-G formula based on SCr of 6.57 mg/dL (H)). Liver Function Tests:  Recent Labs Lab 03/24/17 0032 03/25/17 0800  AST 18  --   ALT 15  --   ALKPHOS 236*  --   BILITOT 1.0  --   PROT 6.8  --   ALBUMIN 3.4* 3.0*   No results for input(s): LIPASE, AMYLASE in the last 168 hours. No results for input(s): AMMONIA in the last 168 hours. Coagulation Profile:  Recent Labs Lab 03/27/17 0418  INR 1.13   Cardiac Enzymes: No results for input(s): CKTOTAL, CKMB, CKMBINDEX, TROPONINI in the last 168 hours. BNP (last 3 results) No results for input(s): PROBNP in the last 8760 hours. HbA1C: No results for input(s): HGBA1C in the last 72 hours. CBG:  Recent Labs Lab 03/27/17 1354 03/27/17 1705 03/27/17 2103 03/28/17 1331 03/28/17 1700  GLUCAP 94 250* 342* 208* 206*   Lipid Profile: No results for input(s): CHOL, HDL, LDLCALC, TRIG, CHOLHDL, LDLDIRECT in the last 72 hours. Thyroid Function Tests: No results for input(s): TSH, T4TOTAL, FREET4, T3FREE, THYROIDAB in the last 72 hours. Anemia Panel: No results for input(s): VITAMINB12, FOLATE, FERRITIN, TIBC, IRON, RETICCTPCT in the last 72 hours. Urine analysis:    Component Value Date/Time   COLORURINE YELLOW 02/02/2017 1123   APPEARANCEUR CLOUDY (A) 02/02/2017 1123   LABSPEC 1.008 02/02/2017 1123   PHURINE 9.0 (H) 02/02/2017 1123   GLUCOSEU >=500 (A) 02/02/2017 1123   HGBUR NEGATIVE 02/02/2017 1123   BILIRUBINUR NEGATIVE 02/02/2017 1123   KETONESUR NEGATIVE 02/02/2017 1123   PROTEINUR >=300 (A) 02/02/2017 1123   UROBILINOGEN 0.2 02/08/2011 1812   NITRITE NEGATIVE 02/02/2017 1123   LEUKOCYTESUR NEGATIVE 02/02/2017 1123   Sepsis Labs: @LABRCNTIP (procalcitonin:4,lacticidven:4)  ) Recent Results (from  the past 240 hour(s))  Blood Cultures x 2 sites     Status: None (Preliminary result)   Collection Time: 03/24/17  4:20 AM  Result Value Ref Range Status   Specimen Description BLOOD RIGHT HAND  Final   Special Requests   Final    BOTTLES DRAWN AEROBIC AND ANAEROBIC Blood Culture adequate volume   Culture NO GROWTH 4 DAYS  Final   Report Status PENDING  Incomplete  Blood Cultures x 2 sites     Status: None (Preliminary result)   Collection Time: 03/24/17  4:40 AM  Result Value Ref Range Status   Specimen Description BLOOD RIGHT FOREARM  Final   Special Requests   Final    BOTTLES DRAWN AEROBIC AND ANAEROBIC Blood Culture adequate volume   Culture NO GROWTH 4 DAYS  Final   Report Status PENDING  Incomplete  MRSA PCR Screening     Status: None   Collection Time: 03/24/17  6:51 AM  Result Value Ref Range Status   MRSA by PCR NEGATIVE NEGATIVE Final    Comment:        The GeneXpert MRSA Assay (FDA approved for NASAL specimens only), is one component of a comprehensive MRSA colonization surveillance program. It is not intended to diagnose MRSA infection nor to guide or monitor treatment for MRSA infections.       Studies: No results found.  Scheduled Meds: . amLODipine  10 mg Oral Daily  . aspirin EC  81 mg Oral Daily  . atorvastatin  40 mg Oral q1800  . cinacalcet  60 mg Oral Q supper  . cloNIDine  0.3 mg Oral TID  . cyclobenzaprine  10 mg Oral QHS  . darbepoetin (ARANESP) injection - DIALYSIS  200 mcg Intravenous Q Wed-HD  . doxercalciferol  7 mcg Intravenous Q M,W,F-HD  . doxycycline  100 mg Oral Q12H  . feeding supplement (NEPRO CARB STEADY)  237 mL Oral BID BM  . feeding supplement (PRO-STAT SUGAR FREE 64)  30 mL Oral BID  . ferric citrate  630 mg Oral TID WC  . heparin  5,000 Units Subcutaneous Q8H  . hydrALAZINE  100 mg Oral Q8H  . insulin aspart  0-5 Units Subcutaneous QHS  . insulin aspart  0-9 Units Subcutaneous TID WC  . insulin detemir  20 Units  Subcutaneous Daily  . isosorbide mononitrate  120 mg Oral Daily  . labetalol  300 mg Oral TID  . multivitamin  1 tablet Oral QHS  . nitroGLYCERIN  0.2 mg Transdermal Daily  . pentoxifylline  400 mg Oral TID WC  . sevelamer carbonate  4,800 mg Oral TID WC  . sodium chloride flush  3 mL Intravenous Q12H  . sodium chloride flush  3 mL Intravenous Q12H    Continuous Infusions: . sodium chloride 250 mL (03/24/17 0409)  . sodium chloride    . sodium chloride    . sodium chloride       LOS: 4 days     Alma Friendly, MD [facility]   If 7PM-7AM, please contact night-coverage [email or web address] Password [facility] 03/28/2017, 6:40 PM

## 2017-03-28 NOTE — Progress Notes (Signed)
   Left groin soft with minimal hematoma Will need antegrade access of bilateral groins for revascularization.  If she is otherwise ready for discharge this could be scheduled as outpatient or if she requires ongoing hospitalization we will get her scheduled next week while she is here.   Rhyland Hinderliter C. Donzetta Matters, MD [facility] Office: [phone] Pager: [phone]

## 2017-03-28 NOTE — Procedures (Addendum)
Up 7kg today, will only tolerate 5L she says.  Plan UF 5 L on HD today.  Had angiogram of LE's yesterday per [facility].  BPs ok on 4 BP meds + Imdur.  Getting IV abx for R foot cellulitis/ DFU's w infection.   I was present at this dialysis session, have reviewed the session itself and made  appropriate changes Kelly Splinter MD [place] pager [phone]   03/28/2017, 12:45 PM

## 2017-03-29 DIAGNOSIS — E11622 Type 2 diabetes mellitus with other skin ulcer: Secondary | ICD-10-CM

## 2017-03-29 DIAGNOSIS — L97909 Non-pressure chronic ulcer of unspecified part of unspecified lower leg with unspecified severity: Secondary | ICD-10-CM

## 2017-03-29 LAB — BASIC METABOLIC PANEL
Anion gap: 13 (ref 5–15)
BUN: 35 mg/dL — AB (ref 6–20)
CO2: 28 mmol/L (ref 22–32)
Calcium: 8.8 mg/dL — ABNORMAL LOW (ref 8.9–10.3)
Chloride: 92 mmol/L — ABNORMAL LOW (ref 101–111)
Creatinine, Ser: 4.9 mg/dL — ABNORMAL HIGH (ref 0.44–1.00)
GFR calc Af Amer: 12 mL/min — ABNORMAL LOW (ref >60)
GFR, EST NON AFRICAN AMERICAN: 10 mL/min — AB (ref >60)
Glucose, Bld: 130 mg/dL — ABNORMAL HIGH (ref 65–99)
POTASSIUM: 4.6 mmol/L (ref 3.5–5.1)
SODIUM: 133 mmol/L — AB (ref 135–145)

## 2017-03-29 LAB — CULTURE, BLOOD (ROUTINE X 2)
CULTURE: NO GROWTH
Culture: NO GROWTH
SPECIAL REQUESTS: ADEQUATE
Special Requests: ADEQUATE

## 2017-03-29 LAB — GLUCOSE, CAPILLARY: GLUCOSE-CAPILLARY: 129 mg/dL — AB (ref 65–99)

## 2017-03-29 MED ORDER — RENA-VITE PO TABS: 1.0000 | ORAL_TABLET | Freq: Every day | ORAL | 0 refills | Status: DC

## 2017-03-29 MED ORDER — DOXYCYCLINE HYCLATE 100 MG PO TABS: 100.0000 mg | ORAL_TABLET | Freq: Two times a day (BID) | ORAL | 0 refills | Status: DC

## 2017-03-29 MED ORDER — PENTOXIFYLLINE ER 400 MG PO TBCR: 400.0000 mg | EXTENDED_RELEASE_TABLET | Freq: Three times a day (TID) | ORAL | 0 refills | Status: DC

## 2017-03-29 MED ORDER — ASPIRIN 81 MG PO TBEC: 81.0000 mg | DELAYED_RELEASE_TABLET | Freq: Every day | ORAL | 0 refills | Status: DC

## 2017-03-29 MED ORDER — CINACALCET HCL 30 MG PO TABS: 60.0000 mg | ORAL_TABLET | Freq: Every day | ORAL | 0 refills | Status: DC

## 2017-03-29 NOTE — Discharge Summary (Signed)
Discharge Summary  [name] LZJ:[id number] DOB: 1975-07-08  PCP: Merrilee Seashore, MD  Admit date: 03/24/2017 Discharge date: 03/29/2017  Time spent: >96mins  Recommendations for Outpatient Follow-up:  1. PCP 2. Vascular surgeons  Discharge Diagnoses:  Active Hospital Problems   Diagnosis Date Noted  . Cellulitis in diabetic foot ([place]) 03/24/2017  . Non-healing ulcer ([place])   . Hypertension 03/24/2017  . Hypertensive urgency 03/24/2017  . Cellulitis 03/24/2017  . Chronic pain 03/24/2017  . Diabetic ulcer of lower leg ([place]) 03/24/2017  . Gangrene of toe of right foot ([place])   . DM (diabetes mellitus), type 2 with renal complications ([place]) 02/40/9735  . End stage renal disease on dialysis ([place])   . Coronary artery disease     Resolved Hospital Problems   Diagnosis Date Noted Date Resolved  No resolved problems to display.    Discharge Condition: Stable   Diet recommendation: Heart Healthy   Vitals:   03/28/17 2129 03/29/17 0548  BP: (!) 206/83 (!) 187/81  Pulse: 64 66  Resp: 19 20  Temp: 98.5 F (36.9 C) 98.4 F (36.9 C)  SpO2: 97% 91%    History of present illness:  41 year old female with a history of hypertension, diabetes, CKD, end-stage renal disease, peripheral vascular disease, presented with worsening right lower leg/foot pain, swelling and erythema.  Patient was found to have a nonhealing diabetic foot ulcer with possible cellulitis started on antibiotics.  Today, patient denies any new complaints.  Denies any chest pain, shortness of breath, headaches, abdominal pain, nausea/vomiting, fever/chills.  Patient stable for discharge  Hospital Course:  Principal Problem:   Cellulitis in diabetic foot ([place]) Active Problems:   End stage renal disease on dialysis [facility])   Coronary artery disease   DM (diabetes mellitus), type 2 with renal complications ([facility])   Hypertension   Hypertensive urgency   Cellulitis   Chronic pain   Diabetic ulcer of  lower leg ([facility])   Gangrene of toe of right foot ([facility])   Non-healing ulcer ([facility])  #Nonhealing diabetic foot ulcer with possible cellulitis Stable X-ray showed multiple acute and old fractures with multiple long and scabs on bilateral feet and toes MRI showed acute fracture of the first metatarsal and subacute fracture of the second and fifth metatarsal of the right foot No strong evidence of acute osteomyelitis ABI showed severe peripheral vascular disease Vascular surgery performed an angiogram on 03/27/17 which showed extensive severe calcification.  Procedure was aborted due to bleeding from the left groin due to difficulty exchanging sheath due to severe calcification Vascular team rec possible atherectomy and angioplasty as an outpatient. Pt advised strongly to make an appointment with them Continue p.o. Doxycycline for a total of 10days total antibiotics  #End-stage renal disease On hemodialysis M/W/F  #Hypertensive urgency Uncontrolled BP Had a lengthy discussion with pt about BP. Pt reports HTN has been uncontrolled for several years and was told by multiple providers, that nothing can be done. Of note pt is on 5 different BP meds, with minimal control Continue amlodipine, clonidine, hydralazine, labetalol, imdur PCP consider work up for secondary HTN  Diabetes Continue current insulin regimen  #Anemia of chronic kidney disease Continue ESA during dialysis  #Thrombocytopenia Unknown etiology Follow-up as an outpatient   Procedures:  Lower extremity angiogram  Consultations:  Nephrology  Orthopedics  ID  Discharge Exam: BP (!) 187/81 (BP Location: Right Arm)   Pulse 66   Temp 98.4 F (36.9 C) (Oral)   Resp 20   Ht  6\' 4"  (1.93 m)   Wt 97.2 kg (214 lb 4.6 oz)   LMP 10/09/2015 Comment: pt on dialysis  SpO2 91%   BMI 26.08 kg/m   General: Alert, awake, oriented X3  Cardiovascular: S1.S2 present, no added sound  Respiratory: Chest clear bilaterally    Discharge Instructions You were cared for by a hospitalist during your hospital stay. If you have any questions about your discharge medications or the care you received while you were in the hospital after you are discharged, you can call the unit and asked to speak with the hospitalist on call if the hospitalist that took care of you is not available. Once you are discharged, your primary care physician will handle any further medical issues. Please note that NO REFILLS for any discharge medications will be authorized once you are discharged, as it is imperative that you return to your primary care physician (or establish a relationship with a primary care physician if you do not have one) for your aftercare needs so that they can reassess your need for medications and monitor your lab values.  Discharge Instructions    Diet - low sodium heart healthy    Complete by:  As directed    Increase activity slowly    Complete by:  As directed      Allergies as of 03/29/2017      Reactions   Cephalexin Other (See Comments)   Reaction unknown- Childhood allergy Tolerated Ceftriaxone in the past   Sulfamethoxazole-trimethoprim Other (See Comments)   Unknown reaction. Pt states that she was told by her mother that she had allergy to Bactrim as a child.      Medication List    STOP taking these medications   amoxicillin-clavulanate 500-125 MG tablet Commonly known as:  AUGMENTIN     TAKE these medications   amLODipine 10 MG tablet Commonly known as:  NORVASC Take 1 tablet (10 mg total) by mouth daily.   aspirin 81 MG EC tablet Take 1 tablet (81 mg total) by mouth daily.   atorvastatin 40 MG tablet Commonly known as:  LIPITOR Take 40 mg by mouth daily.   AURYXIA PO Take 2 tablets by mouth 3 (three) times daily with meals.   cinacalcet 30 MG tablet Commonly known as:  SENSIPAR Take 2 tablets (60 mg total) by mouth daily with supper.   cloNIDine 0.3 MG tablet Commonly known as:   CATAPRES Take 1 tablet (0.3 mg total) by mouth 3 (three) times daily.   cyclobenzaprine 10 MG tablet Commonly known as:  FLEXERIL Take 1 tablet (10 mg total) by mouth at bedtime. What changed:  when to take this  reasons to take this   doxycycline 100 MG tablet Commonly known as:  VIBRA-TABS Take 1 tablet (100 mg total) by mouth every 12 (twelve) hours.   HUMALOG MIX 50/50 KWIKPEN (50-50) 100 UNIT/ML Kwikpen Generic drug:  Insulin Lispro Prot & Lispro Inject 26 Units into the skin 2 (two) times daily.   hydrALAZINE 100 MG tablet Commonly known as:  APRESOLINE Take 1 tablet (100 mg total) by mouth every 8 (eight) hours.   HYDROcodone-acetaminophen 5-325 MG tablet Commonly known as:  NORCO Take 1 tablet by mouth every 6 (six) hours as needed for moderate pain.   insulin lispro 100 UNIT/ML injection Commonly known as:  HUMALOG Inject 6 Units into the skin 3 (three) times daily before meals.   isosorbide mononitrate 120 MG 24 hr tablet Commonly known as:  IMDUR Take 1  tablet (120 mg total) by mouth daily.   labetalol 300 MG tablet Commonly known as:  NORMODYNE Take 1 tablet (300 mg total) by mouth 3 (three) times daily.   multivitamin Tabs tablet Take 1 tablet by mouth at bedtime.   pentoxifylline 400 MG CR tablet Commonly known as:  TRENTAL Take 1 tablet (400 mg total) by mouth 3 (three) times daily with meals.   RENVELA 800 MG tablet Generic drug:  sevelamer carbonate Take 4,800 mg by mouth 3 (three) times daily with meals.   traMADol 50 MG tablet Commonly known as:  ULTRAM Take 1 tablet (50 mg total) by mouth every 6 (six) hours as needed for moderate pain (or Headache unrelieved by tylenol).      Allergies  Allergen Reactions  . Cephalexin Other (See Comments)    Reaction unknown- Childhood allergy Tolerated Ceftriaxone in the past  . Sulfamethoxazole-Trimethoprim Other (See Comments)    Unknown reaction. Pt states that she was told by her mother that  she had allergy to Bactrim as a child.   Follow-up Information    Merrilee Seashore, MD. Schedule an appointment as soon as possible for a visit in 1 week(s).   Specialty:  Internal Medicine Contact information: [address] [place] [address] [phone]        Conrad , MD. Schedule an appointment as soon as possible for a visit in 1 week(s).   Specialties:  Vascular Surgery, Cardiology Contact information: [address] [place] [address] [phone]            The results of significant diagnostics from this hospitalization (including imaging, microbiology, ancillary and laboratory) are listed below for reference.    Significant Diagnostic Studies: Mr Foot Right Wo Contrast  Result Date: 03/25/2017 CLINICAL DATA:  Insulin-dependent diabetic with worsening right lower foot pain and swelling with erythema. Nonhealing ulcers involving the toes. EXAM: MRI OF THE RIGHT FOREFOOT WITHOUT CONTRAST TECHNIQUE: Multiplanar, multisequence MR imaging of the right foot was performed. No intravenous contrast was administered. COMPARISON:  03/24/2017 FINDINGS: Bones/Joint/Cartilage Acute to subacute proximal first phalangeal base fracture with intra-articular extension. Nondisplaced fracture with associated marrow changes along the proximal diaphysis of the first metatarsal. Marrow edema involving the distal diaphysis, neck and head of the second and third metatarsals with associated marrow edema of the base of the second and third proximal phalanges. Subacute appearing fracture involving the second metatarsal head- neck junction. Mild distal fourth metatarsal marrow edema as well. Subacute fifth metatarsal fracture with minimal residual marrow edema. Ligaments Noncontributory Muscles and Tendons Mild intramuscular edema along the plantar aspect of the foot consistent with myositis. No pyomyositis. No tenosynovitis or significant tendinopathy. Soft tissues Diffuse  plantar and dorsal forefoot soft tissue swelling without focal fluid collection or abscess. IMPRESSION: 1. Reactive marrow edema secondary to acute or subacute intra-articular fracture at the base of the right first proximal phalanx and along the proximal diaphysis of the first metatarsal. 2. Subacute fractures of the distal second and fifth metatarsal with reactive marrow edema. 3. No conclusive evidence for acute osteomyelitis. Marrow edema and fractures are believed to reflect changes of diabetic neuropathy. Electronically Signed   By: Ashley Royalty M.D.   On: 03/25/2017 21:37   Dg Foot 2 Views Left  Result Date: 03/24/2017 CLINICAL DATA:  Nonhealing toe ulcers. RIGHT leg swelling. History of diabetes, Charcot joint. EXAM: LEFT FOOT - 2 VIEW; RIGHT FOOT - 2 VIEW COMPARISON:  RIGHT foot radiograph February 13, 2015 and RIGHT  ankle radiograph April 21, 2016. LEFT ankle radiograph February 02, 2017. FINDINGS: RIGHT foot: Acute to subacute corner fractures first proximal phalanx with intra-articular extension. No dislocation. Age indeterminate distal second metatarsal fracture. Unchanged intraosseous cyst first proximal phalanx. Old healed fifth metatarsus fracture. Osteopenia. Soft tissue swellings. Small plantar calcaneal spur. Severe vascular calcifications. Bandage overlying third digit with soft tissue swelling. No subcutaneous gas or radiopaque foreign bodies. Ankle hardware. LEFT foot: No acute fracture deformity or dislocation. Old second and third proximal phalanx fractures. Old fifth metatarsus fracture. Osteopenia. Potential cyst first proximal phalanx. Small plantar calcaneal spur. Chronic offset of tarsal metatarsal joint space on lateral radiograph. Severe vascular calcifications. No subcutaneous gas or radiopaque foreign bodies. IMPRESSION: RIGHT foot: 1. Acute to subacute proximal first phalanx fractures. Age indeterminate second distal metatarsus fracture.Multiple old fractures. 2. Bandage  over third toe, no radiographic findings of osteomyelitis though MRI is more sensitive. LEFT foot: 1. No acute fracture deformity or dislocation. Multiple old fractures. 2. No radiographic findings of osteomyelitis though, MRI is more sensitive. Electronically Signed   By: Elon Alas M.D.   On: 03/24/2017 05:27   Dg Foot 2 Views Right  Result Date: 03/24/2017 CLINICAL DATA:  Nonhealing toe ulcers. RIGHT leg swelling. History of diabetes, Charcot joint. EXAM: LEFT FOOT - 2 VIEW; RIGHT FOOT - 2 VIEW COMPARISON:  RIGHT foot radiograph February 13, 2015 and RIGHT ankle radiograph April 21, 2016. LEFT ankle radiograph February 02, 2017. FINDINGS: RIGHT foot: Acute to subacute corner fractures first proximal phalanx with intra-articular extension. No dislocation. Age indeterminate distal second metatarsal fracture. Unchanged intraosseous cyst first proximal phalanx. Old healed fifth metatarsus fracture. Osteopenia. Soft tissue swellings. Small plantar calcaneal spur. Severe vascular calcifications. Bandage overlying third digit with soft tissue swelling. No subcutaneous gas or radiopaque foreign bodies. Ankle hardware. LEFT foot: No acute fracture deformity or dislocation. Old second and third proximal phalanx fractures. Old fifth metatarsus fracture. Osteopenia. Potential cyst first proximal phalanx. Small plantar calcaneal spur. Chronic offset of tarsal metatarsal joint space on lateral radiograph. Severe vascular calcifications. No subcutaneous gas or radiopaque foreign bodies. IMPRESSION: RIGHT foot: 1. Acute to subacute proximal first phalanx fractures. Age indeterminate second distal metatarsus fracture.Multiple old fractures. 2. Bandage over third toe, no radiographic findings of osteomyelitis though MRI is more sensitive. LEFT foot: 1. No acute fracture deformity or dislocation. Multiple old fractures. 2. No radiographic findings of osteomyelitis though, MRI is more sensitive. Electronically Signed    By: Elon Alas M.D.   On: 03/24/2017 05:27   Xr C-arm No Report  Result Date: 03/12/2017 Please see Notes or Procedures tab for imaging impression.   Microbiology: Recent Results (from the past 240 hour(s))  Blood Cultures x 2 sites     Status: None   Collection Time: 03/24/17  4:20 AM  Result Value Ref Range Status   Specimen Description BLOOD RIGHT HAND  Final   Special Requests   Final    BOTTLES DRAWN AEROBIC AND ANAEROBIC Blood Culture adequate volume   Culture NO GROWTH 5 DAYS  Final   Report Status 03/29/2017 FINAL  Final  Blood Cultures x 2 sites     Status: None   Collection Time: 03/24/17  4:40 AM  Result Value Ref Range Status   Specimen Description BLOOD RIGHT FOREARM  Final   Special Requests   Final    BOTTLES DRAWN AEROBIC AND ANAEROBIC Blood Culture adequate volume   Culture NO GROWTH 5 DAYS  Final   Report Status 03/29/2017  FINAL  Final  MRSA PCR Screening     Status: None   Collection Time: 03/24/17  6:51 AM  Result Value Ref Range Status   MRSA by PCR NEGATIVE NEGATIVE Final    Comment:        The GeneXpert MRSA Assay (FDA approved for NASAL specimens only), is one component of a comprehensive MRSA colonization surveillance program. It is not intended to diagnose MRSA infection nor to guide or monitor treatment for MRSA infections.      Labs: Basic Metabolic Panel:  Recent Labs Lab 03/25/17 0800 03/26/17 0341 03/27/17 0418 03/28/17 0205 03/29/17 0539  NA 136 136 135 135 133*  K 6.1* 5.1 4.6 5.1 4.6  CL 95* 94* 93* 92* 92*  CO2 23 24 29 29 28   GLUCOSE 189* 88 114* 230* 130*  BUN 78* 56* 42* 58* 35*  CREATININE 8.25* 6.57* 5.28* 6.57* 4.90*  CALCIUM 8.3* 8.3* 8.4* 8.9 8.8*  PHOS 10.6*  --   --   --   --    Liver Function Tests:  Recent Labs Lab 03/24/17 0032 03/25/17 0800  AST 18  --   ALT 15  --   ALKPHOS 236*  --   BILITOT 1.0  --   PROT 6.8  --   ALBUMIN 3.4* 3.0*   No results for input(s): LIPASE, AMYLASE in the  last 168 hours. No results for input(s): AMMONIA in the last 168 hours. CBC:  Recent Labs Lab 03/24/17 0032 03/24/17 0409 03/25/17 0221 03/26/17 0341 03/27/17 0418 03/28/17 0205  WBC 11.2* 11.4* 7.9 7.8 6.0 6.4  NEUTROABS 9.5* 9.1*  --   --   --  4.6  HGB 10.6* 10.5* 10.3* 10.3* 10.3* 10.3*  HCT 34.2* 32.2* 33.1* 33.2* 34.5* 33.5*  MCV 91.7 92.0 93.2 93.0 94.5 93.6  PLT 121* 109* 94* 87* 112* 121*   Cardiac Enzymes: No results for input(s): CKTOTAL, CKMB, CKMBINDEX, TROPONINI in the last 168 hours. BNP: BNP (last 3 results) No results for input(s): BNP in the last 8760 hours.  ProBNP (last 3 results) No results for input(s): PROBNP in the last 8760 hours.  CBG:  Recent Labs Lab 03/27/17 2103 03/28/17 1331 03/28/17 1700 03/28/17 2128 03/29/17 0752  GLUCAP 342* 208* 206* 194* 129*       Signed:  Alma Friendly, MD [facility] 03/29/2017, 11:10 PM

## 2017-03-29 NOTE — Progress Notes (Signed)
Dialysis:  [facility] MWF 4h 68min   89kg   2/2   L AVF   Hep 2000 -Hectorol 71mcg IV tiw -Venofer 50mg  IV q Wed -Mircera 200 mcg IV q 2 weeks (last dosed 10/17) BMM: Auryixa 3 tabs qac, Renvela 6 qac, 2 q snacks, Sensipar 60 qd   [facility] Progress Note   Assessment: 1. Nonhealing diabetic foot ulcers w/ cellulitis - completed IV abx. LE angiogram 11/1 per [facility] showing extensive severe calcification. Maybe candidate for atherectomy and angioplasty -- will schedule as outpatient  2. ESRD -  MWF HD 3. HTN/volume - BP's on high side, fluid indiscretion. High IDWG's during admission.  Continues on 4 BP meds (amlod/ clonidine/ hydral/ labetalol) as at home.  4. Anemia  - Hgb 10.3, Aranesp 200 10/31  5. Metabolic bone disease -  Cont VDRA/binders  6. Nutrition - Renal diet/vitamins/prostat for low albumin  7. DM- per primary  8. Thrombocytopenia - hold heparin next HD, improving  9. H/o ascites and hx of paracentesis   Plan -  HD Monday at outpatient center ,encouraging fluid restriction  Kelly Splinter MD [facility] pgr [phone]   03/29/2017, 12:37 PM    Subjective:  Seen in room, siting up in bed. No c/os. Says going home today. Will schedule outpatient vascular procedure    Objective Vitals:   03/28/17 1130 03/28/17 1704 03/28/17 2129 03/29/17 0548  BP: (!) 205/90 (!) 206/81 (!) 206/83 (!) 187/81  Pulse: 68 67 64 66  Resp:  18 19 20   Temp:  97.7 F (36.5 C) 98.5 F (36.9 C) 98.4 F (36.9 C)  TempSrc:  Oral Oral Oral  SpO2:  94% 97% 91%  Weight:   97.2 kg (214 lb 4.6 oz)   Height:       Physical Exam General: WNWD female NAD  Heart: RRR 2/6 SEM  Lungs: CTAB Abdomen: distended with fluid wave  Extremities: bilat foot ulcers, R foot / ankle mild erythema, not really tender today Dialysis Access: LUE AVF +bruit   Additional Objective Labs: Basic Metabolic Panel:  Recent Labs Lab 03/25/17 0800  03/27/17 0418 03/28/17 0205  03/29/17 0539  NA 136  < > 135 135 133*  K 6.1*  < > 4.6 5.1 4.6  CL 95*  < > 93* 92* 92*  CO2 23  < > 29 29 28   GLUCOSE 189*  < > 114* 230* 130*  BUN 78*  < > 42* 58* 35*  CREATININE 8.25*  < > 5.28* 6.57* 4.90*  CALCIUM 8.3*  < > 8.4* 8.9 8.8*  PHOS 10.6*  --   --   --   --   < > = values in this interval not displayed. Liver Function Tests:  Recent Labs Lab 03/24/17 0032 03/25/17 0800  AST 18  --   ALT 15  --   ALKPHOS 236*  --   BILITOT 1.0  --   PROT 6.8  --   ALBUMIN 3.4* 3.0*   No results for input(s): LIPASE, AMYLASE in the last 168 hours. CBC:  Recent Labs Lab 03/24/17 0032 03/24/17 0409 03/25/17 0221 03/26/17 0341 03/27/17 0418 03/28/17 0205  WBC 11.2* 11.4* 7.9 7.8 6.0 6.4  NEUTROABS 9.5* 9.1*  --   --   --  4.6  HGB 10.6* 10.5* 10.3* 10.3* 10.3* 10.3*  HCT 34.2* 32.2* 33.1* 33.2* 34.5* 33.5*  MCV 91.7 92.0 93.2 93.0 94.5 93.6  PLT 121* 109* 94* 87* 112* 121*  Blood Culture    Component Value Date/Time   SDES BLOOD RIGHT FOREARM 03/24/2017 0440   SPECREQUEST  03/24/2017 0440    BOTTLES DRAWN AEROBIC AND ANAEROBIC Blood Culture adequate volume   CULT NO GROWTH 4 DAYS 03/24/2017 0440   REPTSTATUS PENDING 03/24/2017 0440    Cardiac Enzymes: No results for input(s): CKTOTAL, CKMB, CKMBINDEX, TROPONINI in the last 168 hours. CBG:  Recent Labs Lab 03/27/17 2103 03/28/17 1331 03/28/17 1700 03/28/17 2128 03/29/17 0752  GLUCAP 342* 208* 206* 194* 129*   Iron Studies: No results for input(s): IRON, TIBC, TRANSFERRIN, FERRITIN in the last 72 hours. Lab Results  Component Value Date   INR 1.13 03/27/2017   INR 1.24 02/15/2016   INR 1.14 02/15/2015   Medications: . sodium chloride 250 mL (03/24/17 0409)  . sodium chloride    . sodium chloride    . sodium chloride     . amLODipine  10 mg Oral Daily  . aspirin EC  81 mg Oral Daily  . atorvastatin  40 mg Oral q1800  . cinacalcet  60 mg Oral Q supper  . cloNIDine  0.3 mg Oral TID  .  cyclobenzaprine  10 mg Oral QHS  . darbepoetin (ARANESP) injection - DIALYSIS  200 mcg Intravenous Q Wed-HD  . doxercalciferol  7 mcg Intravenous Q M,W,F-HD  . doxycycline  100 mg Oral Q12H  . feeding supplement (NEPRO CARB STEADY)  237 mL Oral BID BM  . feeding supplement (PRO-STAT SUGAR FREE 64)  30 mL Oral BID  . ferric citrate  630 mg Oral TID WC  . heparin  5,000 Units Subcutaneous Q8H  . hydrALAZINE  100 mg Oral Q8H  . insulin aspart  0-5 Units Subcutaneous QHS  . insulin aspart  0-9 Units Subcutaneous TID WC  . insulin detemir  20 Units Subcutaneous Daily  . isosorbide mononitrate  120 mg Oral Daily  . labetalol  300 mg Oral TID  . multivitamin  1 tablet Oral QHS  . nitroGLYCERIN  0.2 mg Transdermal Daily  . pentoxifylline  400 mg Oral TID WC  . sevelamer carbonate  4,800 mg Oral TID WC  . sodium chloride flush  3 mL Intravenous Q12H  . sodium chloride flush  3 mL Intravenous Q12H

## 2017-03-29 NOTE — Progress Notes (Signed)
[name] to be D/C'd Home per MD order.  Discussed prescriptions and follow up appointments with the patient. Prescriptions were called in to patient preferred pharmacy, medication list explained in detail. Pt verbalized understanding.  Skin clean, dry and intact without evidence of skin break down, no evidence of skin tears noted. IV catheter discontinued intact. Site without signs and symptoms of complications. Dressing and pressure applied. Pt denies pain at this time. No complaints noted.  An After Visit Summary was printed and given to the patient. Patient escorted via [place], and D/C home via private auto.  Chapman Fitch BSN, RN [facility] [place] Phone [phone]

## 2017-03-31 ENCOUNTER — Other Ambulatory Visit: Payer: Self-pay | Admitting: *Deleted

## 2017-03-31 ENCOUNTER — Telehealth: Payer: Self-pay | Admitting: Surgery

## 2017-03-31 DIAGNOSIS — N186 End stage renal disease: Secondary | ICD-10-CM | POA: Diagnosis not present

## 2017-03-31 DIAGNOSIS — N2581 Secondary hyperparathyroidism of renal origin: Secondary | ICD-10-CM | POA: Diagnosis not present

## 2017-03-31 DIAGNOSIS — D631 Anemia in chronic kidney disease: Secondary | ICD-10-CM | POA: Diagnosis not present

## 2017-03-31 DIAGNOSIS — E875 Hyperkalemia: Secondary | ICD-10-CM | POA: Diagnosis not present

## 2017-03-31 NOTE — Telephone Encounter (Signed)
Sched appt 04/23/17 at 8:45. Pt called office and spoke to [place] about appt.

## 2017-03-31 NOTE — Telephone Encounter (Signed)
-----   Message from Mena Goes, RN sent at 03/31/2017 10:26 AM EST ----- Regarding: appt asap    ----- Message ----- From: Waynetta Sandy, MD Sent: 03/29/2017   3:47 PM To: [facility] Charge Pool  Needs to f/u with Dr. Trula Slade to schedule angiogram.

## 2017-03-31 NOTE — Progress Notes (Signed)
Left msg. for patient to call back re: date of procedure and instructions.

## 2017-04-01 ENCOUNTER — Encounter (HOSPITAL_COMMUNITY): Payer: Self-pay | Admitting: Emergency Medicine

## 2017-04-01 ENCOUNTER — Telehealth: Payer: Self-pay | Admitting: *Deleted

## 2017-04-01 ENCOUNTER — Emergency Department (HOSPITAL_COMMUNITY)
Admission: EM | Admit: 2017-04-01 | Discharge: 2017-04-02 | Disposition: A | Discharge status: Home / Self Care | Payer: Medicare Other | Attending: Emergency Medicine | Admitting: Emergency Medicine

## 2017-04-01 DIAGNOSIS — I2510 Atherosclerotic heart disease of native coronary artery without angina pectoris: Secondary | ICD-10-CM | POA: Diagnosis not present

## 2017-04-01 DIAGNOSIS — Z794 Long term (current) use of insulin: Secondary | ICD-10-CM | POA: Insufficient documentation

## 2017-04-01 DIAGNOSIS — L97519 Non-pressure chronic ulcer of other part of right foot with unspecified severity: Secondary | ICD-10-CM | POA: Diagnosis not present

## 2017-04-01 DIAGNOSIS — Z7982 Long term (current) use of aspirin: Secondary | ICD-10-CM | POA: Diagnosis not present

## 2017-04-01 DIAGNOSIS — N186 End stage renal disease: Secondary | ICD-10-CM | POA: Insufficient documentation

## 2017-04-01 DIAGNOSIS — I739 Peripheral vascular disease, unspecified: Secondary | ICD-10-CM | POA: Insufficient documentation

## 2017-04-01 DIAGNOSIS — E1122 Type 2 diabetes mellitus with diabetic chronic kidney disease: Secondary | ICD-10-CM | POA: Insufficient documentation

## 2017-04-01 DIAGNOSIS — I120 Hypertensive chronic kidney disease with stage 5 chronic kidney disease or end stage renal disease: Secondary | ICD-10-CM | POA: Insufficient documentation

## 2017-04-01 DIAGNOSIS — Z87891 Personal history of nicotine dependence: Secondary | ICD-10-CM | POA: Insufficient documentation

## 2017-04-01 DIAGNOSIS — S82892D Other fracture of left lower leg, subsequent encounter for closed fracture with routine healing: Secondary | ICD-10-CM | POA: Diagnosis not present

## 2017-04-01 DIAGNOSIS — E11621 Type 2 diabetes mellitus with foot ulcer: Secondary | ICD-10-CM | POA: Insufficient documentation

## 2017-04-01 DIAGNOSIS — Z79899 Other long term (current) drug therapy: Secondary | ICD-10-CM | POA: Insufficient documentation

## 2017-04-01 DIAGNOSIS — M79604 Pain in right leg: Secondary | ICD-10-CM | POA: Diagnosis present

## 2017-04-01 LAB — CBC WITH DIFFERENTIAL/PLATELET
Basophils Absolute: 0 10*3/uL (ref 0.0–0.1)
Basophils Relative: 0 %
EOS PCT: 3 %
Eosinophils Absolute: 0.2 10*3/uL (ref 0.0–0.7)
HCT: 31.2 % — ABNORMAL LOW (ref 36.0–46.0)
Hemoglobin: 9.8 g/dL — ABNORMAL LOW (ref 12.0–15.0)
LYMPHS ABS: 1.3 10*3/uL (ref 0.7–4.0)
LYMPHS PCT: 16 %
MCH: 29.1 pg (ref 26.0–34.0)
MCHC: 31.4 g/dL (ref 30.0–36.0)
MCV: 92.6 fL (ref 78.0–100.0)
MONO ABS: 0.7 10*3/uL (ref 0.1–1.0)
MONOS PCT: 9 %
Neutro Abs: 6.2 10*3/uL (ref 1.7–7.7)
Neutrophils Relative %: 72 %
PLATELETS: 178 10*3/uL (ref 150–400)
RBC: 3.37 MIL/uL — ABNORMAL LOW (ref 3.87–5.11)
RDW: 17.8 % — ABNORMAL HIGH (ref 11.5–15.5)
WBC: 8.5 10*3/uL (ref 4.0–10.5)

## 2017-04-01 LAB — COMPREHENSIVE METABOLIC PANEL
ALT: 25 U/L (ref 14–54)
AST: 33 U/L (ref 15–41)
Albumin: 3.2 g/dL — ABNORMAL LOW (ref 3.5–5.0)
Alkaline Phosphatase: 363 U/L — ABNORMAL HIGH (ref 38–126)
Anion gap: 15 (ref 5–15)
BUN: 42 mg/dL — ABNORMAL HIGH (ref 6–20)
CHLORIDE: 93 mmol/L — AB (ref 101–111)
CO2: 29 mmol/L (ref 22–32)
CREATININE: 6.33 mg/dL — AB (ref 0.44–1.00)
Calcium: 9.1 mg/dL (ref 8.9–10.3)
GFR, EST AFRICAN AMERICAN: 9 mL/min — AB (ref >60)
GFR, EST NON AFRICAN AMERICAN: 7 mL/min — AB (ref >60)
Glucose, Bld: 161 mg/dL — ABNORMAL HIGH (ref 65–99)
POTASSIUM: 4 mmol/L (ref 3.5–5.1)
Sodium: 137 mmol/L (ref 135–145)
TOTAL PROTEIN: 6.9 g/dL (ref 6.5–8.1)
Total Bilirubin: 0.9 mg/dL (ref 0.3–1.2)

## 2017-04-01 MED ORDER — OXYCODONE-ACETAMINOPHEN 5-325 MG PO TABS
1.0000 | ORAL_TABLET | Freq: Once | ORAL | Status: AC
  Administered 2017-04-02: 1 via ORAL
  Filled 2017-04-01: qty 1

## 2017-04-01 MED ORDER — OXYCODONE-ACETAMINOPHEN 5-325 MG PO TABS
ORAL_TABLET | ORAL | Status: AC
  Filled 2017-04-01: qty 1

## 2017-04-01 MED ORDER — OXYCODONE-ACETAMINOPHEN 5-325 MG PO TABS
1.0000 | ORAL_TABLET | Freq: Once | ORAL | Status: AC
  Administered 2017-04-01: 1 via ORAL

## 2017-04-01 NOTE — ED Triage Notes (Signed)
Patient reports right foot pain radiating to right leg onset yesterday , denies injury , currently taking oral antibiotic ( Doxcyline) for right leg cellulitis , denies fever or chills . Hemodialysis q Mon/Wed/Fri.

## 2017-04-01 NOTE — ED Provider Notes (Signed)
[facility] EMERGENCY DEPARTMENT Provider Note   CSN: [id number] Arrival date & time: 04/01/17  2000     History   Chief Complaint Chief Complaint  Patient presents with  . Foot/Leg Pain    HPI [name] is a 41 y.o. female.  HPI  This is a 41 year old female with a history of end-stage renal disease on dialysis Monday, Wednesday, and Friday, diabetes, coronary artery disease, peripheral vascular disease who presents with worsening right leg pain.  Patient reports that she was admitted to the hospital from Sunday to this past Saturday with concerns for cellulitis.  She also describes a workup for peripheral vascular disease.  Since discharge on Saturday she has had increasing right lower extremity pain.  She believes her cellulitis may be coming back.  She denies any fevers.  She is currently taking doxycycline.  She denies any numbness of the foot.  Pain is worse with ambulation.  Current pain is rated at 8 out of 10.  Chart review reveals recent admission for nonhealing diabetic ulcer as well as possible right lower extremity cellulitis.  Patient had a negative MRI for osteomyelitis.  She was evaluated by vascular surgery and had an arteriogram that had to be aborted early but did indicate severe peripheral vascular disease.  She is due for an outpatient procedure on Tuesday.  Past Medical History:  Diagnosis Date  . Chronic anemia    2nd to renal disease  . CIN III (cervical intraepithelial neoplasia grade III) with severe dysplasia    S/P LEEP AND CONE  . Coronary artery disease    Status post cardiac catheterization June 2012 scattered coronary artery disease/atherosclerosis with 70-80% stenosis in a small right PDA.  . 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 DM (diabetes mellitus) ([place])    Long-term insulin  . End stage renal disease on dialysis ([place]) 05/02/11   [facility]; M; W, F; last time 05/01/11  . Fracture of 5th metatarsal 2016   Right  .  Gastroparesis   . 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 History of abscesses in groins 12/06/2010  . Hyperlipidemia    Hypertriglyceridemia 449 HDL 25  . Hypertension   . Migraines    "just on dialysis days"  . Peripheral neuropathy    related to DM  . Peripheral vascular disease ([place])    Tibial occlusive disease evaluated by Dr. Kellie Simmering in August 2011. Medical therapy  . Renal insufficiency    Dialysis since 2012  . Tobacco use disorder    Discontinued March 2012  . Trimalleolar fracture of ankle, closed 02/09/2015   Right    Patient Active Problem List   Diagnosis Date Noted  . Non-healing ulcer ([place])   . Hypertension 03/24/2017  . Hypertensive urgency 03/24/2017  . Cellulitis in diabetic foot ([place]) 03/24/2017  . Cellulitis 03/24/2017  . Chronic pain 03/24/2017  . Diabetic ulcer of lower leg ([place]) 03/24/2017  . Gangrene of toe of right foot ([place])   . Acute on chronic diastolic CHF (congestive heart failure) ([place]) 02/22/2016  . Fluid overload   . Pericardial effusion   . Ascites 02/15/2016  . DM (diabetes mellitus), type 2 with renal complications ([place]) 41/96/2229  . Pain   . Bimalleolar ankle fracture 02/12/2015  . CAD (coronary artery disease) 02/09/2015  . PVD (peripheral vascular disease) ([place])   . Gastroparesis   . Chronic anemia   . Menorrhagia with regular cycle 01/04/2014  . Dysmenorrhea 01/04/2014  . Nausea &  vomiting 10/16/2011  . History of noncompliance with medical treatment 05/09/2011  . Chronic UTI 05/09/2011  . CIN III (cervical intraepithelial neoplasia grade III) with severe dysplasia   . History of abscesses in groins 12/06/2010  . End stage renal disease on dialysis ([place])   . Coronary artery disease   . Hyperlipidemia   . 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 NONDEPENDENT TOBACCO USE DISORDER 12/20/2009  . Essential hypertension, benign 12/20/2009  . NEPHROTIC SYNDROME 12/20/2009  . Shortness of breath 12/20/2009  . Chest pain 12/20/2009    Past Surgical  History:  Procedure Laterality Date  . AV FISTULA PLACEMENT  04/2010  . CERVICAL BIOPSY  W/ LOOP ELECTRODE EXCISION     h/o  . CERVICAL CONE BIOPSY     h/o  . COLPOSCOPY    . DILATION AND CURETTAGE OF UTERUS  2009  . ORIF ANKLE FRACTURE Right 02/09/2015    OB History    Gravida Para Term Preterm AB Living   2 1 1   1 1    SAB TAB Ectopic Multiple Live Births                   Home Medications    Prior to Admission medications   Medication Sig Start Date End Date Taking? Authorizing Provider  amLODipine (NORVASC) 10 MG tablet Take 1 tablet (10 mg total) by mouth daily. 02/24/16  Yes Tat, Shanon Brow, MD  amoxicillin-clavulanate (AUGMENTIN) 500-125 MG tablet Take 1 tablet See admin instructions by mouth. Three times a week after dialysis 03/20/16  Yes [provider]  aspirin EC 81 MG EC tablet Take 1 tablet (81 mg total) by mouth daily. 03/30/17  Yes Alma Friendly, MD  atorvastatin (LIPITOR) 40 MG tablet Take 40 mg by mouth daily.   Yes [provider]  cinacalcet (SENSIPAR) 30 MG tablet Take 2 tablets (60 mg total) by mouth daily with supper. 03/29/17  Yes Alma Friendly, MD  cloNIDine (CATAPRES) 0.3 MG tablet Take 1 tablet (0.3 mg total) by mouth 3 (three) times daily. 11/01/14  Yes Debbe Odea, MD  cyclobenzaprine (FLEXERIL) 10 MG tablet Take 1 tablet (10 mg total) by mouth at bedtime. Patient taking differently: Take 10 mg by mouth at bedtime as needed for muscle spasms.  03/12/17  Yes Magnus Sinning, MD  doxycycline (VIBRA-TABS) 100 MG tablet Take 1 tablet (100 mg total) by mouth every 12 (twelve) hours. 03/29/17  Yes Alma Friendly, MD  Ferric Citrate (AURYXIA PO) Take 2 tablets by mouth 3 (three) times daily with meals.   Yes [provider]  HUMALOG MIX 50/50 KWIKPEN (50-50) 100 UNIT/ML Kwikpen Inject 26 Units into the skin 2 (two) times daily.  04/29/14  Yes [provider]  hydrALAZINE (APRESOLINE) 100 MG tablet Take 1 tablet  (100 mg total) by mouth every 8 (eight) hours. 02/24/16  Yes Tat, Shanon Brow, MD  HYDROcodone-acetaminophen (NORCO) 5-325 MG tablet Take 1 tablet by mouth every 6 (six) hours as needed for moderate pain. 02/02/17  Yes Kirichenko, Tatyana, PA-C  insulin lispro (HUMALOG) 100 UNIT/ML injection Inject 6 Units into the skin 3 (three) times daily before meals.   Yes [provider]  isosorbide mononitrate (IMDUR) 120 MG 24 hr tablet Take 1 tablet (120 mg total) by mouth daily. 02/25/16  Yes Tat, Shanon Brow, MD  labetalol (NORMODYNE) 300 MG tablet Take 1 tablet (300 mg total) by mouth 3 (three) times daily. 11/01/14  Yes Debbe Odea, MD  methyldopa (ALDOMET) 500 MG tablet  Take 500 mg daily by mouth. 01/29/17  Yes [provider]  multivitamin (RENA-VIT) TABS tablet Take 1 tablet by mouth at bedtime. 03/29/17  Yes Alma Friendly, MD  promethazine (PHENERGAN) 12.5 MG tablet Take 12.5 mg every 6 (six) hours as needed by mouth. 01/26/17  Yes [provider]  RENVELA 800 MG tablet Take 4,800 mg by mouth 3 (three) times daily with meals.  04/17/13  Yes [provider]  spironolactone (ALDACTONE) 100 MG tablet Take 100 mg daily by mouth. 02/19/17  Yes [provider]  oxyCODONE-acetaminophen (PERCOCET/ROXICET) 5-325 MG tablet Take 1-2 tablets every 6 (six) hours as needed by mouth for severe pain. 04/02/17   Lydiana Milley, Barbette Hair, MD  pentoxifylline (TRENTAL) 400 MG CR tablet Take 1 tablet (400 mg total) by mouth 3 (three) times daily with meals. 03/29/17   Alma Friendly, MD  traMADol (ULTRAM) 50 MG tablet Take 1 tablet (50 mg total) by mouth every 6 (six) hours as needed for moderate pain (or Headache unrelieved by tylenol). Patient not taking: Reported on 04/01/2017 02/24/16   Orson Eva, MD    Family History Family History  Problem Relation Age of Onset  . Diabetes Mother   . Hypertension Mother   . Diabetes Father   . Hyperlipidemia Father   . Hypertension Father   .  Diabetes Sister   . Stroke Maternal Grandmother   . Diabetes Sister   . Breast cancer Maternal Aunt        Age 16's    Social History Social History   Tobacco Use  . Smoking status: Former Smoker    Packs/day: 0.30    Years: 10.00    Pack years: 3.00    Types: Cigarettes    Last attempt to quit: 08/05/2011    Years since quitting: 5.6  . Smokeless tobacco: Never Used  . Tobacco comment: "quit smoking cigarettes 07/2010"  Substance Use Topics  . Alcohol use: No  . Drug use: No     Allergies   Cephalexin and Sulfamethoxazole-trimethoprim   Review of Systems Review of Systems  Constitutional: Negative for fever.  Respiratory: Negative for shortness of breath.   Cardiovascular: Negative for chest pain.  Gastrointestinal: Negative for abdominal pain.  Musculoskeletal:       Right foot and leg pain  Skin: Positive for color change and wound.  All other systems reviewed and are negative.    Physical Exam Updated Vital Signs BP (!) 185/131   Pulse 67   Temp 98.2 F (36.8 C) (Oral)   Resp 17   Ht 6\' 4"  (1.93 m)   Wt 93.4 kg (206 lb)   LMP 10/09/2015 Comment: pt on dialysis  SpO2 (!) 86%   BMI 25.08 kg/m   Physical Exam  Constitutional: She is oriented to person, place, and time. She appears well-developed and well-nourished.  Ill-appearing but nontoxic, appears older than stated age  HENT:  Head: Normocephalic and atraumatic.  Cardiovascular: Normal rate, regular rhythm and normal heart sounds.  Pulmonary/Chest: Effort normal. No respiratory distress. She has no wheezes.  Abdominal: Soft. She exhibits distension.  Striae noted upon the abdomen, normal bowel sounds, no significant tenderness, mild distention  Musculoskeletal:  No significant lower extremity edema, mild erythema of the right lower extremity, warm to touch with fair perfusion, very faint dopplerable pulse of the right DP noted Ulceration of the right third toe, additional dark demarcation of the  right fifth toe  Neurological: She is alert and oriented to  person, place, and time.  Skin: Skin is warm and dry.  See above  Psychiatric: She has a normal mood and affect.  Nursing note and vitals reviewed.    ED Treatments / Results  Labs (all labs ordered are listed, but only abnormal results are displayed) Labs Reviewed  CBC WITH DIFFERENTIAL/PLATELET - Abnormal; Notable for the following components:      Result Value   RBC 3.37 (*)    Hemoglobin 9.8 (*)    HCT 31.2 (*)    RDW 17.8 (*)    All other components within normal limits  COMPREHENSIVE METABOLIC PANEL - Abnormal; Notable for the following components:   Chloride 93 (*)    Glucose, Bld 161 (*)    BUN 42 (*)    Creatinine, Ser 6.33 (*)    Albumin 3.2 (*)    Alkaline Phosphatase 363 (*)    GFR calc non Af Amer 7 (*)    GFR calc Af Amer 9 (*)    All other components within normal limits    EKG  EKG Interpretation None       Radiology No results found.  Procedures Procedures (including critical care time)  Medications Ordered in ED Medications  oxyCODONE-acetaminophen (PERCOCET/ROXICET) 5-325 MG per tablet 1 tablet (1 tablet Oral Given 04/01/17 2016)  oxyCODONE-acetaminophen (PERCOCET/ROXICET) 5-325 MG per tablet 1 tablet (1 tablet Oral Given 04/02/17 0053)     Initial Impression / Assessment and Plan / ED Course  I have reviewed the triage vital signs and the nursing notes.  Pertinent labs & imaging results that were available during my care of the patient were reviewed by me and considered in my medical decision making (see chart for details).     Patient presents with right foot and leg pain.  She is concerned that she may have recurrent cellulitis.  She is nontoxic appearing.  Afebrile.  Vital signs notable for hypertension.  I have reviewed prior notes and images of her leg prior to her prior admission.  Physical exam is mostly unchanged when compared to these images.  She has no fever or white  count to suggest worsening cellulitis.  She is currently on appropriate antibiotics.  Given pain with ambulation, suspect patient's pain is related to claudication and peripheral vascular disease.  She does have a dopplerable pulse.  Foot at this time is warm and reasonably perfused.  She is due to have a vascular procedure on Tuesday.  Patient was given Percocet.  I discussed with patient my thoughts regarding her peripheral vascular disease and pain.  Will discharge with a short course of pain medication.  Close follow-up with vascular surgery recommended.  At this time she does not have any evidence of acute limb ischemia.  She was given precautions regarding limb ischemia including acute worsening of pain, color change, cool foot to touch.  Patient stated understanding.  After history, exam, and medical workup I feel the patient has been appropriately medically screened and is safe for discharge home. Pertinent diagnoses were discussed with the patient. Patient was given return precautions.   Final Clinical Impressions(s) / ED Diagnoses   Final diagnoses:  Claudication in peripheral vascular disease [facility])    ED Discharge Orders        Ordered    oxyCODONE-acetaminophen (PERCOCET/ROXICET) 5-325 MG tablet  Every 6 hours PRN     04/02/17 0107       [name], MD 04/02/17 [phone]

## 2017-04-01 NOTE — Telephone Encounter (Signed)
Call to patient informed of procedure for 04/08/17 [facility] admitting at 10am. 1/2 dose HS insulin on 11/12 high protein snack. Light breakfast on 11/13 and may have po's until 6 am, take any heart or blood pressure medications only, then NPO for procedure. To check Blood sugar am of 13th and call pre-op desk at [facility] if <70 or >220 for instructions. Patient verbalized understanding of these instructions.

## 2017-04-02 DIAGNOSIS — I739 Peripheral vascular disease, unspecified: Secondary | ICD-10-CM | POA: Diagnosis not present

## 2017-04-02 DIAGNOSIS — E875 Hyperkalemia: Secondary | ICD-10-CM | POA: Diagnosis not present

## 2017-04-02 DIAGNOSIS — N186 End stage renal disease: Secondary | ICD-10-CM | POA: Diagnosis not present

## 2017-04-02 DIAGNOSIS — N2581 Secondary hyperparathyroidism of renal origin: Secondary | ICD-10-CM | POA: Diagnosis not present

## 2017-04-02 DIAGNOSIS — D631 Anemia in chronic kidney disease: Secondary | ICD-10-CM | POA: Diagnosis not present

## 2017-04-02 MED ORDER — OXYCODONE-ACETAMINOPHEN 5-325 MG PO TABS: 1.0000 | ORAL_TABLET | Freq: Four times a day (QID) | ORAL | 0 refills | Status: DC | PRN

## 2017-04-02 NOTE — Discharge Instructions (Signed)
Was seen today for right leg pain.  This is likely related to your known peripheral vascular disease.  Pain with ambulation is called claudication.  You will be given a short course of pain medication.  It is very important that you follow-up with your vascular surgeon for the procedure scheduled on Tuesday.  Call his office to let him know that you were seen in the ER.  Today you have dopplerable pulses in her foot is fairly warm.  If you note that your foot gets cold, have acute increase in pain that is worsened without ambulation, or notes worsening redness or wounds to the foot you should be reevaluated.

## 2017-04-03 ENCOUNTER — Ambulatory Visit (HOSPITAL_COMMUNITY)
Admission: RE | Admit: 2017-04-03 | Discharge: 2017-04-03 | Disposition: A | Discharge status: Home / Self Care | Payer: Medicare Other | Source: Ambulatory Visit | Attending: Nephrology | Admitting: Nephrology

## 2017-04-03 ENCOUNTER — Other Ambulatory Visit (HOSPITAL_COMMUNITY): Payer: Self-pay | Admitting: Nephrology

## 2017-04-03 DIAGNOSIS — Z8719 Personal history of other diseases of the digestive system: Secondary | ICD-10-CM | POA: Diagnosis not present

## 2017-04-03 DIAGNOSIS — R188 Other ascites: Secondary | ICD-10-CM | POA: Insufficient documentation

## 2017-04-03 MED ORDER — LIDOCAINE 2% (20 MG/ML) 5 ML SYRINGE
INTRAMUSCULAR | Status: AC
  Filled 2017-04-03: qty 10

## 2017-04-04 ENCOUNTER — Emergency Department (HOSPITAL_COMMUNITY): Payer: Medicare Other

## 2017-04-04 ENCOUNTER — Encounter (HOSPITAL_COMMUNITY): Payer: Self-pay | Admitting: *Deleted

## 2017-04-04 ENCOUNTER — Other Ambulatory Visit: Payer: Self-pay

## 2017-04-04 ENCOUNTER — Emergency Department (HOSPITAL_COMMUNITY)
Admission: EM | Admit: 2017-04-04 | Discharge: 2017-04-04 | Disposition: A | Discharge status: Home / Self Care | Payer: Medicare Other | Attending: Emergency Medicine | Admitting: Emergency Medicine

## 2017-04-04 DIAGNOSIS — N186 End stage renal disease: Secondary | ICD-10-CM | POA: Diagnosis not present

## 2017-04-04 DIAGNOSIS — Z7982 Long term (current) use of aspirin: Secondary | ICD-10-CM | POA: Diagnosis not present

## 2017-04-04 DIAGNOSIS — Z87891 Personal history of nicotine dependence: Secondary | ICD-10-CM | POA: Insufficient documentation

## 2017-04-04 DIAGNOSIS — I739 Peripheral vascular disease, unspecified: Secondary | ICD-10-CM | POA: Diagnosis not present

## 2017-04-04 DIAGNOSIS — I132 Hypertensive heart and chronic kidney disease with heart failure and with stage 5 chronic kidney disease, or end stage renal disease: Secondary | ICD-10-CM | POA: Insufficient documentation

## 2017-04-04 DIAGNOSIS — Z794 Long term (current) use of insulin: Secondary | ICD-10-CM | POA: Insufficient documentation

## 2017-04-04 DIAGNOSIS — Z79899 Other long term (current) drug therapy: Secondary | ICD-10-CM | POA: Diagnosis not present

## 2017-04-04 DIAGNOSIS — E11621 Type 2 diabetes mellitus with foot ulcer: Secondary | ICD-10-CM | POA: Diagnosis not present

## 2017-04-04 DIAGNOSIS — Z992 Dependence on renal dialysis: Secondary | ICD-10-CM | POA: Insufficient documentation

## 2017-04-04 DIAGNOSIS — R238 Other skin changes: Secondary | ICD-10-CM | POA: Diagnosis present

## 2017-04-04 DIAGNOSIS — I2510 Atherosclerotic heart disease of native coronary artery without angina pectoris: Secondary | ICD-10-CM | POA: Diagnosis not present

## 2017-04-04 DIAGNOSIS — E1122 Type 2 diabetes mellitus with diabetic chronic kidney disease: Secondary | ICD-10-CM | POA: Diagnosis not present

## 2017-04-04 DIAGNOSIS — I5033 Acute on chronic diastolic (congestive) heart failure: Secondary | ICD-10-CM | POA: Diagnosis not present

## 2017-04-04 LAB — BASIC METABOLIC PANEL
ANION GAP: 17 — AB (ref 5–15)
BUN: 54 mg/dL — ABNORMAL HIGH (ref 6–20)
CALCIUM: 8.6 mg/dL — AB (ref 8.9–10.3)
CHLORIDE: 93 mmol/L — AB (ref 101–111)
CO2: 27 mmol/L (ref 22–32)
CREATININE: 6.49 mg/dL — AB (ref 0.44–1.00)
GFR calc Af Amer: 8 mL/min — ABNORMAL LOW (ref >60)
GFR calc non Af Amer: 7 mL/min — ABNORMAL LOW (ref >60)
GLUCOSE: 198 mg/dL — AB (ref 65–99)
Potassium: 4.4 mmol/L (ref 3.5–5.1)
Sodium: 137 mmol/L (ref 135–145)

## 2017-04-04 LAB — CBC
HCT: 30.8 % — ABNORMAL LOW (ref 36.0–46.0)
HEMOGLOBIN: 9.5 g/dL — AB (ref 12.0–15.0)
MCH: 28.7 pg (ref 26.0–34.0)
MCHC: 30.8 g/dL (ref 30.0–36.0)
MCV: 93.1 fL (ref 78.0–100.0)
Platelets: 143 10*3/uL — ABNORMAL LOW (ref 150–400)
RBC: 3.31 MIL/uL — AB (ref 3.87–5.11)
RDW: 18.3 % — ABNORMAL HIGH (ref 11.5–15.5)
WBC: 7.9 10*3/uL (ref 4.0–10.5)

## 2017-04-04 MED ORDER — OXYCODONE-ACETAMINOPHEN 5-325 MG PO TABS: 2.0000 | ORAL_TABLET | Freq: Four times a day (QID) | ORAL | 0 refills | Status: AC | PRN

## 2017-04-04 MED ORDER — HYDROMORPHONE HCL 1 MG/ML IJ SOLN
1.0000 mg | Freq: Once | INTRAMUSCULAR | Status: AC
  Administered 2017-04-04: 1 mg via INTRAVENOUS
  Filled 2017-04-04: qty 1

## 2017-04-04 NOTE — ED Provider Notes (Signed)
[facility] EMERGENCY DEPARTMENT Provider Note   CSN: [id number] Arrival date & time: 04/04/17  0537     History   Chief Complaint Chief Complaint  Patient presents with  . Wound Check    HPI [name] is a 41 y.o. female with h/o HTN, IDDM with neuropathy, CAD, anemia, ESRD on HD MWF, peripheral vascular disease with planned vascular procedure in 4 days presents to ED for right 3rd toe color change noticed this morning. Right foot also feels colder. No change in pain. No fevers, chills, direct trauma. She is still taking doxycyline and percocet prescribed to her at last hospital admission for RLE cellulitis.   HPI  Past Medical History:  Diagnosis Date  . Chronic anemia    2nd to renal disease  . CIN III (cervical intraepithelial neoplasia grade III) with severe dysplasia    S/P LEEP AND CONE  . Coronary artery disease    Status post cardiac catheterization June 2012 scattered coronary artery disease/atherosclerosis with 70-80% stenosis in a small right PDA.  . Diabetic Charcot's joint disease ([place])   . DM (diabetes mellitus) ([place])    Long-term insulin  . End stage renal disease on dialysis ([place]) 05/02/11   [facility]; M; W, F; last time 05/01/11  . Fracture of 5th metatarsal 2016   Right  . Gastroparesis   . 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 History of abscesses in groins 12/06/2010  . Hyperlipidemia    Hypertriglyceridemia 449 HDL 25  . Hypertension   . Migraines    "just on dialysis days"  . Peripheral neuropathy    related to DM  . Peripheral vascular disease ([place])    Tibial occlusive disease evaluated by Dr. Kellie Simmering in August 2011. Medical therapy  . Renal insufficiency    Dialysis since 2012  . Tobacco use disorder    Discontinued March 2012  . Trimalleolar fracture of ankle, closed 02/09/2015   Right    Patient Active Problem List   Diagnosis Date Noted  . Non-healing ulcer ([place])   . Hypertension 03/24/2017  . Hypertensive urgency  03/24/2017  . Cellulitis in diabetic foot ([place]) 03/24/2017  . Cellulitis 03/24/2017  . Chronic pain 03/24/2017  . Diabetic ulcer of lower leg ([place]) 03/24/2017  . Gangrene of toe of right foot ([place])   . Acute on chronic diastolic CHF (congestive heart failure) ([place]) 02/22/2016  . Fluid overload   . Pericardial effusion   . Ascites 02/15/2016  . DM (diabetes mellitus), type 2 with renal complications ([place]) 44/07/4740  . Pain   . Bimalleolar ankle fracture 02/12/2015  . CAD (coronary artery disease) 02/09/2015  . PVD (peripheral vascular disease) ([place])   . Gastroparesis   . Chronic anemia   . Menorrhagia with regular cycle 01/04/2014  . Dysmenorrhea 01/04/2014  . Nausea & vomiting 10/16/2011  . History of noncompliance with medical treatment 05/09/2011  . Chronic UTI 05/09/2011  . CIN III (cervical intraepithelial neoplasia grade III) with severe dysplasia   . History of abscesses in groins 12/06/2010  . End stage renal disease on dialysis ([place])   . Coronary artery disease   . Hyperlipidemia   . 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 NONDEPENDENT TOBACCO USE DISORDER 12/20/2009  . Essential hypertension, benign 12/20/2009  . NEPHROTIC SYNDROME 12/20/2009  . Shortness of breath 12/20/2009  . Chest pain 12/20/2009    Past Surgical History:  Procedure Laterality Date  . AV FISTULA PLACEMENT  04/2010  . CERVICAL BIOPSY  W/  LOOP ELECTRODE EXCISION     h/o  . CERVICAL CONE BIOPSY     h/o  . COLPOSCOPY    . DILATION AND CURETTAGE OF UTERUS  2009  . ORIF ANKLE FRACTURE Right 02/09/2015    OB History    Gravida Para Term Preterm AB Living   2 1 1   1 1    SAB TAB Ectopic Multiple Live Births                   Home Medications    Prior to Admission medications   Medication Sig Start Date End Date Taking? Authorizing Provider  amLODipine (NORVASC) 10 MG tablet Take 1 tablet (10 mg total) by mouth daily. 02/24/16  Yes TatShanon Brow, MD  aspirin EC 81 MG EC tablet Take 1 tablet (81 mg  total) by mouth daily. 03/30/17  Yes Alma Friendly, MD  atorvastatin (LIPITOR) 40 MG tablet Take 40 mg by mouth daily.   Yes [provider]  cinacalcet (SENSIPAR) 30 MG tablet Take 2 tablets (60 mg total) by mouth daily with supper. 03/29/17  Yes Alma Friendly, MD  cloNIDine (CATAPRES) 0.3 MG tablet Take 1 tablet (0.3 mg total) by mouth 3 (three) times daily. 11/01/14  Yes Debbe Odea, MD  cyclobenzaprine (FLEXERIL) 10 MG tablet Take 1 tablet (10 mg total) by mouth at bedtime. Patient taking differently: Take 10 mg by mouth at bedtime as needed for muscle spasms.  03/12/17  Yes Magnus Sinning, MD  doxycycline (VIBRA-TABS) 100 MG tablet Take 1 tablet (100 mg total) by mouth every 12 (twelve) hours. 03/29/17  Yes Alma Friendly, MD  Ferric Citrate (AURYXIA PO) Take 2 tablets by mouth 3 (three) times daily with meals.   Yes [provider]  HUMALOG MIX 50/50 KWIKPEN (50-50) 100 UNIT/ML Kwikpen Inject 26 Units into the skin 2 (two) times daily.  04/29/14  Yes [provider]  hydrALAZINE (APRESOLINE) 100 MG tablet Take 1 tablet (100 mg total) by mouth every 8 (eight) hours. 02/24/16  Yes Tat, Shanon Brow, MD  HYDROcodone-acetaminophen (NORCO) 5-325 MG tablet Take 1 tablet by mouth every 6 (six) hours as needed for moderate pain. 02/02/17  Yes Kirichenko, Tatyana, PA-C  insulin lispro (HUMALOG) 100 UNIT/ML injection Inject 6 Units into the skin 3 (three) times daily before meals.   Yes [provider]  isosorbide mononitrate (IMDUR) 120 MG 24 hr tablet Take 1 tablet (120 mg total) by mouth daily. 02/25/16  Yes Tat, Shanon Brow, MD  labetalol (NORMODYNE) 300 MG tablet Take 1 tablet (300 mg total) by mouth 3 (three) times daily. 11/01/14  Yes Debbe Odea, MD  methyldopa (ALDOMET) 500 MG tablet Take 500 mg daily by mouth. 01/29/17  Yes [provider]  multivitamin (RENA-VIT) TABS tablet Take 1 tablet by mouth at bedtime. 03/29/17  Yes Alma Friendly, MD    promethazine (PHENERGAN) 12.5 MG tablet Take 12.5 mg every 6 (six) hours as needed by mouth for nausea or vomiting.  01/26/17  Yes [provider]  RENVELA 800 MG tablet Take 4,800 mg by mouth 3 (three) times daily with meals.  04/17/13  Yes [provider]  spironolactone (ALDACTONE) 100 MG tablet Take 100 mg daily by mouth. 02/19/17  Yes [provider]  amoxicillin-clavulanate (AUGMENTIN) 500-125 MG tablet Take 1 tablet See admin instructions by mouth. Three times a week after dialysis 03/20/16   [provider]  oxyCODONE-acetaminophen (PERCOCET/ROXICET) 5-325 MG tablet Take 2 tablets every 6 (six) hours  as needed for up to 2 days by mouth for severe pain. 04/04/17 04/06/17  Kinnie Feil, PA-C  pentoxifylline (TRENTAL) 400 MG CR tablet Take 1 tablet (400 mg total) by mouth 3 (three) times daily with meals. Patient not taking: Reported on 04/04/2017 03/29/17   Alma Friendly, MD  traMADol (ULTRAM) 50 MG tablet Take 1 tablet (50 mg total) by mouth every 6 (six) hours as needed for moderate pain (or Headache unrelieved by tylenol). Patient not taking: Reported on 04/01/2017 02/24/16   Orson Eva, MD    Family History Family History  Problem Relation Age of Onset  . Diabetes Mother   . Hypertension Mother   . Diabetes Father   . Hyperlipidemia Father   . Hypertension Father   . Diabetes Sister   . Stroke Maternal Grandmother   . Diabetes Sister   . Breast cancer Maternal Aunt        Age 37's    Social History Social History   Tobacco Use  . Smoking status: Former Smoker    Packs/day: 0.30    Years: 10.00    Pack years: 3.00    Types: Cigarettes    Last attempt to quit: 08/05/2011    Years since quitting: 5.6  . Smokeless tobacco: Never Used  . Tobacco comment: "quit smoking cigarettes 07/2010"  Substance Use Topics  . Alcohol use: No  . Drug use: No     Allergies   Cephalexin and Sulfamethoxazole-trimethoprim   Review of  Systems Review of Systems  Constitutional: Negative for chills and fever.  Respiratory: Negative for cough and shortness of breath.   Cardiovascular: Negative for chest pain.  Musculoskeletal: Positive for arthralgias and gait problem.  Skin: Positive for color change and wound.  Neurological: Positive for numbness (h/o neuropathy).     Physical Exam Updated Vital Signs BP (!) 158/74   Pulse 63   Temp 98.6 F (37 C) (Oral)   Resp 16   Ht 6\' 4"  (1.93 m)   Wt 93.4 kg (206 lb)   LMP 10/09/2015 Comment: pt on dialysis  SpO2 100%   BMI 25.08 kg/m   Physical Exam  Constitutional: She is oriented to person, place, and time. She appears well-developed and well-nourished. No distress.  Appears older than stated age  HENT:  Head: Normocephalic and atraumatic.  Right Ear: External ear normal.  Left Ear: External ear normal.  Nose: Nose normal.  Eyes: Conjunctivae and EOM are normal. No scleral icterus.  Neck: Normal range of motion. Neck supple.  Cardiovascular: Normal rate, regular rhythm and normal heart sounds.  No murmur heard. Left foot is warmer than right Non palpable DP and PT pulses (right) Dopplerable DP pulse (right) Faint palpable DP pulse, non palpable PT pulse (left) Dopplerable DP pulse (left)  Pulmonary/Chest: Effort normal. She has wheezes. She exhibits no tenderness.  Expiratory wheezing to bilateral lower lobes, cleared with cough. No rhonchi or rales.   Abdominal: Soft. There is no tenderness.  Decreased BS  Musculoskeletal: Normal range of motion. She exhibits no deformity.  Full PROM of RIGHT toes without pain  Neurological: She is alert and oriented to person, place, and time.  Decreased sensation to light touch in lateral feet  Skin: Skin is warm and dry. Capillary refill takes less than 2 seconds.  Wound to LEFT 3rd, 4th and 5th toe Wound to RIGHT 3rd and 5th toe See pictures  Psychiatric: She has a normal mood and affect. Her behavior is normal.  Judgment  and thought content normal.  Nursing note and vitals reviewed.          ED Treatments / Results  Labs (all labs ordered are listed, but only abnormal results are displayed) Labs Reviewed  BASIC METABOLIC PANEL - Abnormal; Notable for the following components:      Result Value   Chloride 93 (*)    Glucose, Bld 198 (*)    BUN 54 (*)    Creatinine, Ser 6.49 (*)    Calcium 8.6 (*)    GFR calc non Af Amer 7 (*)    GFR calc Af Amer 8 (*)    Anion gap 17 (*)    All other components within normal limits  CBC - Abnormal; Notable for the following components:   RBC 3.31 (*)    Hemoglobin 9.5 (*)    HCT 30.8 (*)    RDW 18.3 (*)    Platelets 143 (*)    All other components within normal limits    EKG  EKG Interpretation None       Radiology Dg Foot 2 Views Right  Result Date: 04/04/2017 CLINICAL DATA:  41 year old female with progressive diabetic ulcer along the medial aspect of the second toe. EXAM: RIGHT FOOT - 2 VIEW COMPARISON:  MRI 03/25/2017.  Right foot radiographs 03/24/2017. FINDINGS: Again noted partially visible ORIF hardware about the right ankle. Calcaneus and tarsal bones appear stable and intact. Advanced peripheral vascular calcification in the foot. Healed fracture of the fifth metatarsal is unchanged. Intra-articular fractures at the base of the first proximal phalanx with partial healing -at the lateral aspect of the base of the first proximal phalanx. Stable nondisplaced fracture of the second metatarsal head. Mild heterogeneity at the base of the second proximal phalanx, but no convincing cortical osteolysis. No new fracture or dislocation identified. No subcutaneous gas. IMPRESSION: 1. No radiographic evidence of acute osteomyelitis at this time. 2. Intra-articular fractures of the first proximal phalanx and second metatarsal appear stable or improved since 03/24/2017. 3. Advanced calcified peripheral vascular disease. Electronically Signed   By: Genevie Ann M.D.   On: 04/04/2017 07:34   Ir Abdomen US Limited  Result Date: 04/03/2017 CLINICAL DATA:  41 year old female with a history of ascites. EXAM: LIMITED ABDOMEN ULTRASOUND FOR ASCITES TECHNIQUE: Limited ultrasound survey for ascites was performed in all four abdominal quadrants. COMPARISON:  None. FINDINGS: Ultrasound survey demonstrates scant ascites. IMPRESSION: Ultrasound survey during attempted paracentesis demonstrate scant ascites. Paracentesis not attempted. Signed, Dulcy Fanny. Earleen Newport, DO [facility] Electronically Signed   By: Corrie Mckusick D.O.   On: 04/03/2017 15:51    Procedures Procedures (including critical care time)  Medications Ordered in ED Medications  HYDROmorphone (DILAUDID) injection 1 mg (1 mg Intravenous Given 04/04/17 0840)     Initial Impression / Assessment and Plan / ED Course  I have reviewed the triage vital signs and the nursing notes.  Pertinent labs & imaging results that were available during my care of the patient were reviewed by me and considered in my medical decision making (see chart for details).  Clinical Course as of Apr 04 933  [facility] Apr 04, 2017  1324 Creatinine: (!) 6.49 [CG]  0655 GFR, Est Non African American: (!) 7 [CG]  0655 Hemoglobin: (!) 9.5 [CG]    Clinical Course User Index [CG] Kinnie Feil, PA-C   41 yo female with h/o PVD and chronic right third toe dry gangrene presents for worsening pain and  discoloration to this toe onset over night. This is her second visit in the last 1 week for similar symptoms. She admitted on 03/24/17 for RLE cellulitis and non healing diabetic foot ulcers and was evaluated by vascular surgery. Angiogram performed had to be aborted due to left groin hematoma, and pt is scheduled for arteriogram in 4 days with Dr. Trula Slade.   On exam right foot is colder than left. She worsening darker right 3rd toe gangrene compared to last picture. She has  moderate discomfort with palpation of midfoot. Right DP pulses are dopplerable but sluggish.   Pt shared with Dr Venora Maples who assisted with vascular consult (Dr. Bridgett Larsson) who recommends pain control and discharge for upcoming procedure.   Final Clinical Impressions(s) / ED Diagnoses   Final diagnoses:  Peripheral vascular disease with pain at rest [facility])    ED Discharge Orders        Ordered    oxyCODONE-acetaminophen (PERCOCET/ROXICET) 5-325 MG tablet  Every 6 hours PRN     04/04/17 0932       Kinnie Feil, PA-C 04/04/17 3016    Jola Schmidt, MD 04/07/17 1836

## 2017-04-04 NOTE — Discharge Instructions (Signed)
Take percocet 2 tablets every 6 hours for pain.  You can take 500 mg acetaminophen every 8 hours in addition to help with pain.   Follow up with your schedule vascular procedure.

## 2017-04-04 NOTE — ED Triage Notes (Signed)
Patient states she has had a wound to the right foot third toe. She woke up this morning and her toe is discolored and cool to touch.

## 2017-04-05 DIAGNOSIS — E875 Hyperkalemia: Secondary | ICD-10-CM | POA: Diagnosis not present

## 2017-04-05 DIAGNOSIS — D631 Anemia in chronic kidney disease: Secondary | ICD-10-CM | POA: Diagnosis not present

## 2017-04-05 DIAGNOSIS — N186 End stage renal disease: Secondary | ICD-10-CM | POA: Diagnosis not present

## 2017-04-05 DIAGNOSIS — N2581 Secondary hyperparathyroidism of renal origin: Secondary | ICD-10-CM | POA: Diagnosis not present

## 2017-04-07 DIAGNOSIS — E875 Hyperkalemia: Secondary | ICD-10-CM | POA: Diagnosis not present

## 2017-04-07 DIAGNOSIS — N2581 Secondary hyperparathyroidism of renal origin: Secondary | ICD-10-CM | POA: Diagnosis not present

## 2017-04-07 DIAGNOSIS — D631 Anemia in chronic kidney disease: Secondary | ICD-10-CM | POA: Diagnosis not present

## 2017-04-07 DIAGNOSIS — N186 End stage renal disease: Secondary | ICD-10-CM | POA: Diagnosis not present

## 2017-04-08 ENCOUNTER — Ambulatory Visit (HOSPITAL_COMMUNITY)
Admission: RE | Admit: 2017-04-08 | Discharge: 2017-04-08 | Disposition: A | Discharge status: Home / Self Care | Payer: Medicare Other | Source: Ambulatory Visit | Attending: Surgery | Admitting: Surgery

## 2017-04-08 ENCOUNTER — Encounter (HOSPITAL_COMMUNITY)
Admission: RE | Disposition: A | Discharge status: Home / Self Care | Payer: Self-pay | Source: Ambulatory Visit | Attending: Surgery

## 2017-04-08 ENCOUNTER — Other Ambulatory Visit: Payer: Self-pay | Admitting: *Deleted

## 2017-04-08 DIAGNOSIS — Z794 Long term (current) use of insulin: Secondary | ICD-10-CM | POA: Insufficient documentation

## 2017-04-08 DIAGNOSIS — K3184 Gastroparesis: Secondary | ICD-10-CM | POA: Insufficient documentation

## 2017-04-08 DIAGNOSIS — L97519 Non-pressure chronic ulcer of other part of right foot with unspecified severity: Secondary | ICD-10-CM | POA: Diagnosis not present

## 2017-04-08 DIAGNOSIS — I120 Hypertensive chronic kidney disease with stage 5 chronic kidney disease or end stage renal disease: Secondary | ICD-10-CM | POA: Insufficient documentation

## 2017-04-08 DIAGNOSIS — I70239 Atherosclerosis of native arteries of right leg with ulceration of unspecified site: Secondary | ICD-10-CM | POA: Diagnosis not present

## 2017-04-08 DIAGNOSIS — Z882 Allergy status to sulfonamides status: Secondary | ICD-10-CM | POA: Insufficient documentation

## 2017-04-08 DIAGNOSIS — E1143 Type 2 diabetes mellitus with diabetic autonomic (poly)neuropathy: Secondary | ICD-10-CM | POA: Insufficient documentation

## 2017-04-08 DIAGNOSIS — L97529 Non-pressure chronic ulcer of other part of left foot with unspecified severity: Secondary | ICD-10-CM | POA: Diagnosis not present

## 2017-04-08 DIAGNOSIS — Z992 Dependence on renal dialysis: Secondary | ICD-10-CM | POA: Diagnosis not present

## 2017-04-08 DIAGNOSIS — I2510 Atherosclerotic heart disease of native coronary artery without angina pectoris: Secondary | ICD-10-CM | POA: Diagnosis not present

## 2017-04-08 DIAGNOSIS — Z87891 Personal history of nicotine dependence: Secondary | ICD-10-CM | POA: Diagnosis not present

## 2017-04-08 DIAGNOSIS — E1151 Type 2 diabetes mellitus with diabetic peripheral angiopathy without gangrene: Secondary | ICD-10-CM | POA: Insufficient documentation

## 2017-04-08 DIAGNOSIS — D696 Thrombocytopenia, unspecified: Secondary | ICD-10-CM | POA: Insufficient documentation

## 2017-04-08 DIAGNOSIS — E11610 Type 2 diabetes mellitus with diabetic neuropathic arthropathy: Secondary | ICD-10-CM | POA: Insufficient documentation

## 2017-04-08 DIAGNOSIS — E1122 Type 2 diabetes mellitus with diabetic chronic kidney disease: Secondary | ICD-10-CM | POA: Diagnosis not present

## 2017-04-08 DIAGNOSIS — N186 End stage renal disease: Secondary | ICD-10-CM | POA: Insufficient documentation

## 2017-04-08 DIAGNOSIS — E11621 Type 2 diabetes mellitus with foot ulcer: Secondary | ICD-10-CM | POA: Insufficient documentation

## 2017-04-08 DIAGNOSIS — E781 Pure hyperglyceridemia: Secondary | ICD-10-CM | POA: Diagnosis not present

## 2017-04-08 DIAGNOSIS — I70249 Atherosclerosis of native arteries of left leg with ulceration of unspecified site: Secondary | ICD-10-CM | POA: Diagnosis not present

## 2017-04-08 LAB — POCT I-STAT, CHEM 8
BUN: 62 mg/dL — AB (ref 6–20)
CALCIUM ION: 0.92 mmol/L — AB (ref 1.15–1.40)
CHLORIDE: 98 mmol/L — AB (ref 101–111)
CREATININE: 7.8 mg/dL — AB (ref 0.44–1.00)
GLUCOSE: 122 mg/dL — AB (ref 65–99)
HCT: 35 % — ABNORMAL LOW (ref 36.0–46.0)
Hemoglobin: 11.9 g/dL — ABNORMAL LOW (ref 12.0–15.0)
Potassium: 4.8 mmol/L (ref 3.5–5.1)
SODIUM: 137 mmol/L (ref 135–145)
TCO2: 31 mmol/L (ref 22–32)

## 2017-04-08 LAB — GLUCOSE, CAPILLARY
GLUCOSE-CAPILLARY: 125 mg/dL — AB (ref 65–99)
GLUCOSE-CAPILLARY: 105 mg/dL — AB (ref 65–99)
GLUCOSE-CAPILLARY: 60 mg/dL — AB (ref 65–99)
GLUCOSE-CAPILLARY: 87 mg/dL (ref 65–99)
GLUCOSE-CAPILLARY: 57 mg/dL — AB (ref 65–99)
GLUCOSE-CAPILLARY: 69 mg/dL (ref 65–99)
GLUCOSE-CAPILLARY: 82 mg/dL (ref 65–99)
GLUCOSE-CAPILLARY: 189 mg/dL — AB (ref 65–99)
Glucose-Capillary: 62 mg/dL — ABNORMAL LOW (ref 65–99)
Glucose-Capillary: 65 mg/dL (ref 65–99)
Glucose-Capillary: 80 mg/dL (ref 65–99)
Glucose-Capillary: 144 mg/dL — ABNORMAL HIGH (ref 65–99)

## 2017-04-08 LAB — POCT ACTIVATED CLOTTING TIME
ACTIVATED CLOTTING TIME: 213 s
Activated Clotting Time: 180 seconds

## 2017-04-08 SURGERY — PERIPHERAL VASCULAR CATHETERIZATION
Anesthesia: LOCAL

## 2017-04-08 MED ORDER — HEPARIN (PORCINE) IN NACL 2-0.9 UNIT/ML-% IJ SOLN
INTRAMUSCULAR | Status: AC | PRN
  Administered 2017-04-08: 1000 mL via INTRA_ARTERIAL

## 2017-04-08 MED ORDER — MORPHINE SULFATE (PF) 10 MG/ML IV SOLN: 2.0000 mg | INTRAVENOUS | Status: DC | PRN

## 2017-04-08 MED ORDER — FENTANYL CITRATE (PF) 100 MCG/2ML IJ SOLN
INTRAMUSCULAR | Status: DC | PRN
  Administered 2017-04-08 (×2): 25 ug via INTRAVENOUS

## 2017-04-08 MED ORDER — OXYCODONE HCL 5 MG PO TABS
ORAL_TABLET | ORAL | Status: AC
  Filled 2017-04-08: qty 2

## 2017-04-08 MED ORDER — HEPARIN SODIUM (PORCINE) 1000 UNIT/ML IJ SOLN
INTRAMUSCULAR | Status: DC | PRN
  Administered 2017-04-08: 9000 [IU] via INTRAVENOUS

## 2017-04-08 MED ORDER — OXYCODONE HCL 5 MG PO TABS
5.0000 mg | ORAL_TABLET | ORAL | Status: DC | PRN
  Administered 2017-04-08: 10 mg via ORAL

## 2017-04-08 MED ORDER — HYDRALAZINE HCL 20 MG/ML IJ SOLN
5.0000 mg | INTRAMUSCULAR | Status: AC | PRN
  Administered 2017-04-08 (×2): 5 mg via INTRAVENOUS

## 2017-04-08 MED ORDER — IODIXANOL 320 MG/ML IV SOLN
INTRAVENOUS | Status: DC | PRN
Start: 2017-04-08 — End: 2017-04-08
  Administered 2017-04-08: 80 mL via INTRA_ARTERIAL

## 2017-04-08 MED ORDER — MIDAZOLAM HCL 2 MG/2ML IJ SOLN
INTRAMUSCULAR | Status: DC | PRN
  Administered 2017-04-08 (×2): 1 mg via INTRAVENOUS

## 2017-04-08 MED ORDER — SODIUM CHLORIDE 0.9% FLUSH: 3.0000 mL | INTRAVENOUS | Status: DC | PRN

## 2017-04-08 MED ORDER — LIDOCAINE HCL (PF) 1 % IJ SOLN
INTRAMUSCULAR | Status: AC
  Filled 2017-04-08: qty 30

## 2017-04-08 MED ORDER — DEXTROSE 50 % IV SOLN
25.0000 mL | Freq: Once | INTRAVENOUS | Status: AC
  Administered 2017-04-08: 25 mL via INTRAVENOUS

## 2017-04-08 MED ORDER — METHYLDOPA 500 MG PO TABS
500.0000 mg | ORAL_TABLET | Freq: Once | ORAL | Status: AC
Start: 2017-04-08 — End: 2017-04-08
  Administered 2017-04-08: 500 mg via ORAL
  Filled 2017-04-08: qty 1

## 2017-04-08 MED ORDER — ASPIRIN EC 81 MG PO TBEC: 81.0000 mg | DELAYED_RELEASE_TABLET | Freq: Every day | ORAL | Status: DC

## 2017-04-08 MED ORDER — HYDRALAZINE HCL 50 MG PO TABS
100.0000 mg | ORAL_TABLET | Freq: Once | ORAL | Status: AC
  Administered 2017-04-08: 100 mg via ORAL
  Filled 2017-04-08: qty 2

## 2017-04-08 MED ORDER — HYDRALAZINE HCL 20 MG/ML IJ SOLN
INTRAMUSCULAR | Status: AC
  Filled 2017-04-08: qty 1

## 2017-04-08 MED ORDER — LABETALOL HCL 5 MG/ML IV SOLN
INTRAVENOUS | Status: AC
  Administered 2017-04-08: 10 mg via INTRAVENOUS
  Filled 2017-04-08: qty 4

## 2017-04-08 MED ORDER — HEPARIN SODIUM (PORCINE) 1000 UNIT/ML IJ SOLN
INTRAMUSCULAR | Status: AC
  Filled 2017-04-08: qty 1

## 2017-04-08 MED ORDER — LABETALOL HCL 5 MG/ML IV SOLN
INTRAVENOUS | Status: AC
  Filled 2017-04-08: qty 4

## 2017-04-08 MED ORDER — MIDAZOLAM HCL 2 MG/2ML IJ SOLN
INTRAMUSCULAR | Status: AC
  Filled 2017-04-08: qty 2

## 2017-04-08 MED ORDER — LIDOCAINE HCL (PF) 1 % IJ SOLN
INTRAMUSCULAR | Status: DC | PRN
  Administered 2017-04-08: 15 mL

## 2017-04-08 MED ORDER — LABETALOL HCL 300 MG PO TABS
300.0000 mg | ORAL_TABLET | Freq: Once | ORAL | Status: AC
Start: 2017-04-08 — End: 2017-04-08
  Administered 2017-04-08: 300 mg via ORAL
  Filled 2017-04-08: qty 1

## 2017-04-08 MED ORDER — CLONIDINE HCL 0.3 MG PO TABS
0.3000 mg | ORAL_TABLET | Freq: Once | ORAL | Status: AC
  Administered 2017-04-08: 0.3 mg via ORAL
  Filled 2017-04-08: qty 1

## 2017-04-08 MED ORDER — SODIUM CHLORIDE 0.9% FLUSH: 3.0000 mL | Freq: Two times a day (BID) | INTRAVENOUS | Status: DC

## 2017-04-08 MED ORDER — SODIUM CHLORIDE 0.9 % IV SOLN: 250.0000 mL | INTRAVENOUS | Status: DC | PRN

## 2017-04-08 MED ORDER — DEXTROSE 50 % IV SOLN
INTRAVENOUS | Status: AC
  Filled 2017-04-08: qty 50

## 2017-04-08 MED ORDER — FENTANYL CITRATE (PF) 100 MCG/2ML IJ SOLN
INTRAMUSCULAR | Status: AC
  Filled 2017-04-08: qty 2

## 2017-04-08 MED ORDER — LABETALOL HCL 5 MG/ML IV SOLN
10.0000 mg | INTRAVENOUS | Status: AC | PRN
  Administered 2017-04-08 (×4): 10 mg via INTRAVENOUS

## 2017-04-08 MED ORDER — HEPARIN (PORCINE) IN NACL 2-0.9 UNIT/ML-% IJ SOLN
INTRAMUSCULAR | Status: AC
  Filled 2017-04-08: qty 1000

## 2017-04-08 MED ORDER — DEXTROSE 50 % IV SOLN
INTRAVENOUS | Status: DC | PRN
  Administered 2017-04-08 (×2): 25 mL via INTRAVENOUS

## 2017-04-08 MED ORDER — DEXTROSE 50 % IV SOLN
INTRAVENOUS | Status: AC
  Administered 2017-04-08: 25 mL via INTRAVENOUS
  Filled 2017-04-08: qty 50

## 2017-04-08 SURGICAL SUPPLY — 17 items
CATH OMNI FLUSH 5F 65CM (CATHETERS) ×2 IMPLANT
CATH QUICKCROSS SUPP .035X90CM (MICROCATHETER) ×2 IMPLANT
COVER PRB 48X5XTLSCP FOLD TPE (BAG) ×1 IMPLANT
COVER PROBE 5X48 (BAG) ×3
DEVICE CONTINUOUS FLUSH (MISCELLANEOUS) ×2 IMPLANT
DEVICE TORQUE H2O (MISCELLANEOUS) ×2 IMPLANT
GUIDEWIRE ANGLED .035X260CM (WIRE) ×2 IMPLANT
KIT MICROINTRODUCER STIFF 5F (SHEATH) ×2 IMPLANT
KIT PV (KITS) ×3 IMPLANT
SHEATH HIGHFLEX ANSEL 7FR 55CM (SHEATH) ×2 IMPLANT
SHEATH PINNACLE 5F 10CM (SHEATH) ×2 IMPLANT
SHEATH PINNACLE 7F 10CM (SHEATH) ×2 IMPLANT
SYR MEDRAD MARK V 150ML (SYRINGE) ×3 IMPLANT
TRANSDUCER W/STOPCOCK (MISCELLANEOUS) ×3 IMPLANT
TRAY PV CATH (CUSTOM PROCEDURE TRAY) ×3 IMPLANT
WIRE BENTSON .035X145CM (WIRE) ×2 IMPLANT
WIRE ROSEN-J .035X260CM (WIRE) ×2 IMPLANT

## 2017-04-08 NOTE — Progress Notes (Addendum)
Site area: LFA Site Prior to Removal:  Level 0  (old bruising) Pressure Applied For: 35 min Manual:   yes Patient Status During Pull:  stable Post Pull Site:  Level 0 no change Post Pull Instructions Given:  yes Post Pull Pulses Present: doppler Dressing Applied: tegaderm  Bedrest begins @ 1600 till 2000 Comments: removed by Dalbert Batman

## 2017-04-08 NOTE — Interval H&P Note (Signed)
History and Physical Interval Note:  04/08/2017 12:36 PM  [name]  has presented today for surgery, with the diagnosis of pvd  The various methods of treatment have been discussed with the patient and family. After consideration of risks, benefits and other options for treatment, the patient has consented to  Procedure(s): ABDOMINAL AORTOGRAM W/LOWER EXTREMITY ([name]) as a surgical intervention .  The patient's history has been reviewed, patient examined, no change in status, stable for surgery.  I have reviewed the patient's chart and labs.  Questions were answered to the patient's satisfaction.     [name]

## 2017-04-08 NOTE — Discharge Instructions (Signed)

## 2017-04-08 NOTE — Op Note (Signed)
    Patient name: [name] MRN: [id number] DOB: [birth year] Sex: female  04/08/2017 Pre-operative Diagnosis: Bilateral lower extremity ulcers Post-operative diagnosis:  Same Surgeon:  [name] Procedure Performed:  1.  Ultrasound-guided access, left femoral artery  2.  Right lower extremity runoff  3.  Second order catheterization  4.  Conscious sedation (28 minutes)   Indications: The patient has bilateral lower extremity wounds.  She is here today for a repeat attempt at revascularization of the right leg for limb salvage.  Procedure:  The patient was identified in the holding area and taken to room 8.  The patient was then placed supine on the table and prepped and draped in the usual sterile fashion.  A time out was called.  Conscious sedation was administered with the use of IV fentanyl and Versed under continuous physician and nurse monitoring.  Heart rate, blood pressure, and oxygen saturations were continuously monitored.  Ultrasound was used to evaluate the left common femoral artery.  It was patent .  A digital ultrasound image was acquired.  A micropuncture needle was used to access the left common femoral artery under ultrasound guidance.  An 018 wire was advanced without resistance and a micropuncture sheath was placed.  The 018 wire was removed and a benson wire was placed.  The micropuncture sheath was exchanged for a 6 french sheath..  Next, using the omniflush catheter and a benson wire, I got wire access into the right external iliac artery.  I then used a 035 quick cross catheter and advanced this into the right external iliac artery.  The Bentson wire was removed and a Rosen wire was placed.  Next, a 7 French 55 cm high flex Ansell sheath was inserted.  I was able to advance this over the bifurcation but could not advance it any further than the right common iliac artery.  I performed additional imaging of the right lower extremity.  Findings:   Aortogram: Not  performed  Right Lower Extremity: High-grade, heavily calcified distal right common iliac artery stenosis extending into the profunda femoral artery.  There is also a nearly occlusive lesion with possible thrombus within the right popliteal artery.  The patient had three-vessel runoff however the peroneal artery becomes diminutive.  There is distal disease on the foot.  Left Lower Extremity: Not evaluated  Intervention: None  Impression:  #1 because of the high-grade stenosis in the common femoral artery, the decision has been made to proceed with right common femoral endarterectomy.  At that time an attempt at percutaneous revascularization using atherectomy device of the popliteal and superficial femoral artery will be performed.  This will be done in the operating room at a later date.    Theotis Burrow, M.D. [facility] [place] Office: [phone] Pager:  [phone]

## 2017-04-08 NOTE — Progress Notes (Signed)
Dr Scot Dock notified of b/p and ok to d/c home and no new orders

## 2017-04-08 NOTE — Progress Notes (Addendum)
Given peanut butter crackers and soda for CBG 57

## 2017-04-08 NOTE — Progress Notes (Signed)
Called cath lab, spoke with Olive Bass and reported low CBGs of 62, 65 and treatment with 1 amp D50 total (IV). Will recheck CBG upon arrival to cath lab per [facility] but okay to bring pt over.

## 2017-04-09 ENCOUNTER — Other Ambulatory Visit: Payer: Self-pay | Admitting: *Deleted

## 2017-04-09 ENCOUNTER — Telehealth: Payer: Self-pay | Admitting: *Deleted

## 2017-04-09 DIAGNOSIS — H25811 Combined forms of age-related cataract, right eye: Secondary | ICD-10-CM | POA: Diagnosis not present

## 2017-04-09 DIAGNOSIS — D631 Anemia in chronic kidney disease: Secondary | ICD-10-CM | POA: Diagnosis not present

## 2017-04-09 DIAGNOSIS — N2581 Secondary hyperparathyroidism of renal origin: Secondary | ICD-10-CM | POA: Diagnosis not present

## 2017-04-09 DIAGNOSIS — H2511 Age-related nuclear cataract, right eye: Secondary | ICD-10-CM | POA: Diagnosis not present

## 2017-04-09 DIAGNOSIS — N186 End stage renal disease: Secondary | ICD-10-CM | POA: Diagnosis not present

## 2017-04-09 DIAGNOSIS — E875 Hyperkalemia: Secondary | ICD-10-CM | POA: Diagnosis not present

## 2017-04-09 NOTE — Telephone Encounter (Signed)
Call to patient instructed to be at [facility] admitting department 04/16/17 at [place] for surgery. NPO past midnight and plan to be admitted post op. Follow instructions she receives from the Pre Admission Testing Department. Her Dialysis Sched. for that week is Sunday Tuesday Friday, according to patient.  Verbalizedunderstanding.

## 2017-04-10 ENCOUNTER — Encounter (INDEPENDENT_AMBULATORY_CARE_PROVIDER_SITE_OTHER): Payer: Self-pay | Admitting: Family

## 2017-04-10 ENCOUNTER — Ambulatory Visit (INDEPENDENT_AMBULATORY_CARE_PROVIDER_SITE_OTHER): Payer: Medicare Other | Admitting: Orthopedic Surgery

## 2017-04-10 VITALS — Ht 75.0 in | Wt 210.0 lb

## 2017-04-10 DIAGNOSIS — I96 Gangrene, not elsewhere classified: Secondary | ICD-10-CM | POA: Diagnosis not present

## 2017-04-10 NOTE — Progress Notes (Signed)
Office Visit Note   Patient: [name]           Date of Birth: July 10, 1975           MRN: [id number] Visit Date: 04/10/2017              Requested by: [name], [place], [place] [address] PCP: Janet Seashore, MD  Chief Complaint  Patient presents with  . Right Foot - Open Wound    Gangrene 3rd toe. 2nd and 5th toe ulceration. revasc surgery with [facility] 04/16/17      HPI: Patient is a 41 year old woman [name] toes 3 and 5 of the right foot.  Patient states this started after a blood blister ruptured.  Patient states she has pain and swelling denies any odor.  Patient is scheduled for revascularization procedure with [name] on 04/16/2017 next Wednesday.  Patient states she has 2 blockages and is scheduled for surgery at [name].  Assessment & Plan: Visit Diagnoses:  1. Gangrene of toe of right foot ([place])     Plan: I will coordinate my care as needed.  Discussed that options are for [name] to do the revascularization and then proceed with a transmetatarsal amputation or I could assist in proceed with a transmetatarsal amputation as well or could follow-up several days after her revascularization procedure and determine the timing for partial foot amputation.  Follow-Up Instructions: Return if symptoms worsen or fail to improve.   Ortho Exam  Patient is alert, oriented, no adenopathy, well-dressed, normal affect, normal respiratory effort. Examination patient has black gangrenous changes to the third and fifth toe right foot there is no ascending cellulitis she does not have a pulse there is no drainage no odor no signs of infection.  Ischemic changes up to the MTP joint of the third and fifth toe.  Imaging: No results found. No images are attached to the encounter.  Labs: Lab Results  Component Value Date   HGBA1C 9.5 (H) 03/24/2017   HGBA1C 11.2 (H) 02/15/2016   HGBA1C 8.8 (H) 02/11/2015   ESRSEDRATE 25 (H)  03/24/2017   CRP 1.8 (H) 03/24/2017   LABURIC 6.5 03/24/2017   REPTSTATUS 03/29/2017 FINAL 03/24/2017   GRAMSTAIN  02/21/2016    RARE WBC PRESENT, PREDOMINANTLY PMN NO ORGANISMS SEEN    GRAMSTAIN  02/21/2016    RARE WBC PRESENT, PREDOMINANTLY PMN NO ORGANISMS SEEN    CULT NO GROWTH 5 DAYS 03/24/2017    Orders:  No orders of the defined types were placed in this encounter.  No orders of the defined types were placed in this encounter.    Procedures: No procedures performed  Clinical Data: No additional findings.  ROS:  All other systems negative, except as noted in the HPI. Review of Systems  Objective: Vital Signs: Ht 6\' 3"  (1.905 m)   Wt 210 lb (95.3 kg)   LMP 10/09/2015 Comment: pt on dialysis  BMI 26.25 kg/m   Specialty Comments:  No specialty comments available.  PMFS History: Patient Active Problem List   Diagnosis Date Noted  . Non-healing ulcer ([place])   . Hypertension 03/24/2017  . Hypertensive urgency 03/24/2017  . Cellulitis in diabetic foot ([place]) 03/24/2017  . Cellulitis 03/24/2017  . Chronic pain 03/24/2017  . Diabetic ulcer of lower leg ([place]) 03/24/2017  . Gangrene of toe of right foot ([place])   . Acute on chronic diastolic CHF (congestive heart failure) ([place]) 02/22/2016  . Fluid overload   .  Pericardial effusion   . Ascites 02/15/2016  . DM (diabetes mellitus), type 2 with renal complications ([place]) 09/38/1829  . Pain   . Bimalleolar ankle fracture 02/12/2015  . CAD (coronary artery disease) 02/09/2015  . PVD (peripheral vascular disease) ([place])   . Gastroparesis   . Chronic anemia   . Menorrhagia with regular cycle 01/04/2014  . Dysmenorrhea 01/04/2014  . Nausea & vomiting 10/16/2011  . History of noncompliance with medical treatment 05/09/2011  . Chronic UTI 05/09/2011  . CIN III (cervical intraepithelial neoplasia grade III) with severe dysplasia   . History of abscesses in groins 12/06/2010  . End stage renal disease on dialysis ([place])    . Coronary artery disease   . Hyperlipidemia   . 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 NONDEPENDENT TOBACCO USE DISORDER 12/20/2009  . Essential hypertension, benign 12/20/2009  . NEPHROTIC SYNDROME 12/20/2009  . Shortness of breath 12/20/2009  . Chest pain 12/20/2009   Past Medical History:  Diagnosis Date  . Chronic anemia    2nd to renal disease  . CIN III (cervical intraepithelial neoplasia grade III) with severe dysplasia    S/P LEEP AND CONE  . Coronary artery disease    Status post cardiac catheterization June 2012 scattered coronary artery disease/atherosclerosis with 70-80% stenosis in a small right PDA.  . Diabetic Charcot's joint disease ([place])   . DM (diabetes mellitus) ([place])    Long-term insulin  . End stage renal disease on dialysis ([place]) 05/02/11   [facility]; M; W, F; last time 05/01/11  . Fracture of 5th metatarsal 2016   Right  . Gastroparesis   . 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 History of abscesses in groins 12/06/2010  . Hyperlipidemia    Hypertriglyceridemia 449 HDL 25  . Hypertension   . Migraines    "just on dialysis days"  . Peripheral neuropathy    related to DM  . Peripheral vascular disease ([place])    Tibial occlusive disease evaluated by [name] in August 2011. Medical therapy  . Renal insufficiency    Dialysis since 2012  . Tobacco use disorder    Discontinued March 2012  . Trimalleolar fracture of ankle, closed 02/09/2015   Right    Family History  Problem Relation Age of Onset  . Diabetes Mother   . Hypertension Mother   . Diabetes Father   . Hyperlipidemia Father   . Hypertension Father   . Diabetes Sister   . Stroke Maternal Grandmother   . Diabetes Sister   . Breast cancer Maternal Aunt        Age 85's    Past Surgical History:  Procedure Laterality Date  . ABDOMINAL AORTOGRAM W/LOWER EXTREMITY [name] 03/27/2017   Procedure: ABDOMINAL AORTOGRAM W/LOWER EXTREMITY;  Surgeon: Janet Yakima, MD;  Location: [facility];  Service:  Cardiovascular;  Laterality: [name];  . AV FISTULA PLACEMENT  04/2010  . CARDIAC CATHETERIZATION [name] 02/10/2015   Procedure: Left Heart Cath and Coronary Angiography;  Surgeon: Janet Dials, MD;  Location: [facility];  Service: Cardiovascular;  Laterality: [name];  . CARDIAC CATHETERIZATION [name] 02/21/2016   Procedure: Right Heart Cath;  Surgeon: Jolaine Artist, MD;  Location: [facility];  Service: Cardiovascular;  Laterality: [name];  . CERVICAL BIOPSY  W/ LOOP ELECTRODE EXCISION     h/o  . CERVICAL CONE BIOPSY     h/o  . COLPOSCOPY    . DILATION AND CURETTAGE OF UTERUS  2009  . ORIF ANKLE  FRACTURE Right 02/09/2015  . ORIF ANKLE FRACTURE Right 02/16/2015   Procedure: OPEN REDUCTION INTERNAL FIXATION (ORIF) RIGHT ANKLE FRACTURE;  Surgeon: Altamese Tahoma, MD;  Location: [facility];  Service: Orthopedics;  Laterality: Right;   Social History   Occupational History    Employer: [employer]  Tobacco Use  . Smoking status: Former Smoker    Packs/day: 0.30    Years: 10.00    Pack years: 3.00    Types: Cigarettes    Last attempt to quit: 08/05/2011    Years since quitting: 5.6  . Smokeless tobacco: Never Used  . Tobacco comment: "quit smoking cigarettes 07/2010"  Substance and Sexual Activity  . Alcohol use: No  . Drug use: No  . Sexual activity: Not on file

## 2017-04-11 DIAGNOSIS — N2581 Secondary hyperparathyroidism of renal origin: Secondary | ICD-10-CM | POA: Diagnosis not present

## 2017-04-11 DIAGNOSIS — E875 Hyperkalemia: Secondary | ICD-10-CM | POA: Diagnosis not present

## 2017-04-11 DIAGNOSIS — N186 End stage renal disease: Secondary | ICD-10-CM | POA: Diagnosis not present

## 2017-04-11 DIAGNOSIS — D631 Anemia in chronic kidney disease: Secondary | ICD-10-CM | POA: Diagnosis not present

## 2017-04-13 DIAGNOSIS — N2581 Secondary hyperparathyroidism of renal origin: Secondary | ICD-10-CM | POA: Diagnosis not present

## 2017-04-13 DIAGNOSIS — E875 Hyperkalemia: Secondary | ICD-10-CM | POA: Diagnosis not present

## 2017-04-13 DIAGNOSIS — N186 End stage renal disease: Secondary | ICD-10-CM | POA: Diagnosis not present

## 2017-04-13 DIAGNOSIS — D631 Anemia in chronic kidney disease: Secondary | ICD-10-CM | POA: Diagnosis not present

## 2017-04-14 ENCOUNTER — Telehealth: Payer: Self-pay | Admitting: *Deleted

## 2017-04-14 ENCOUNTER — Other Ambulatory Visit: Payer: Self-pay | Admitting: *Deleted

## 2017-04-14 NOTE — Pre-Procedure Instructions (Signed)
[name]  04/14/2017      [id number] [address], [place] [address] [address] Phone: [phone] Fax: [phone]    Your procedure is scheduled on April 16, 2017.  Report to [facility] Admitting at 06:30 A.M.  Call this number if you have problems the morning of surgery:  [phone]   Remember:  Do not eat food or drink liquids after midnight.   Take these medicines the morning of surgery with A SIP OF WATER :  Amlodipine (Norvasc) Clonidine (Catapres) Hydralazine (Apresoline) Isosorbide mononitrate (Imdur) Labetalol (Normodyne) Methyldopa (Aldomet) Eye drops- if needed Pain pill-if needed  Take your Aspirin as directed by your surgeon  Starting TODAY, STOP taking any Aleve, Naproxen, Ibuprofen, Motrin, Advil, Goody's, BC's, all herbal medications, fish oil, and all vitamins     How to Manage Your Diabetes Before and After Surgery  Why is it important to control my blood sugar before and after surgery? . Improving blood sugar levels before and after surgery helps healing and can limit problems. . A way of improving blood sugar control is eating a healthy diet by: o  Eating less sugar and carbohydrates o  Increasing activity/exercise o  Talking with your doctor about reaching your blood sugar goals . High blood sugars (greater than 180 mg/dL) can raise your risk of infections and slow your recovery, so you will need to focus on controlling your diabetes during the weeks before surgery. . Make sure that the doctor who takes care of your diabetes knows about your planned surgery including the date and location.  How do I manage my blood sugar before surgery? . Check your blood sugar at least 4 times a day, starting 2 days before surgery, to make sure that the level is not too high or low. o Check your blood sugar the morning of your surgery when you wake up and every 2 hours until you get to the Short  Stay unit. . If your blood sugar is less than 70 mg/dL, you will need to treat for low blood sugar: o Do not take insulin. o Treat a low blood sugar (less than 70 mg/dL) with  cup of clear juice (cranberry or apple), 4 glucose tablets, OR glucose gel. Recheck blood sugar in 15 minutes after treatment (to make sure it is greater than 70 mg/dL). If your blood sugar is not greater than 70 mg/dL on recheck, call [phone] o  for further instructions. . Report your blood sugar to the short stay nurse when you get to Short Stay.  . If you are admitted to the hospital after surgery: o Your blood sugar will be checked by the staff and you will probably be given insulin after surgery (instead of oral diabetes medicines) to make sure you have good blood sugar levels. o The goal for blood sugar control after surgery is 80-180 mg/dL.       WHAT DO I DO ABOUT MY DIABETES MEDICATION?   . THE NIGHT BEFORE SURGERY, take 18 units of Humalog Mix 50/50 insulin.  . THE MORNING OF SURGERY: DO NOT take any other insulins.     . If your CBG is greater than 220 mg/dL, you may take  of your sliding scale (correction) dose of insulin.  Only if CBG is greater than 220 mg/dL, take 3 units of insulin lispro (Humalog)      Do not wear jewelry, make-up or nail polish.  Do not  wear lotions, powders, or perfumes, or deodorant.  Do not shave 48 hours prior to surgery.    Do not bring valuables to the hospital.   [facility] is not responsible for any belongings or valuables.  Contacts, dentures or bridgework may not be worn into surgery.  Leave your suitcase in the car.  After surgery it may be brought to your room.  For patients admitted to the hospital, discharge time will be determined by your treatment team.  Patients discharged the day of surgery will not be allowed to drive home.    Special instructions:   - Preparing For Surgery  Before surgery, you can play an important role.  Because skin is not sterile, your skin needs to be as free of germs as possible. You can reduce the number of germs on your skin by washing with CHG (chlorahexidine gluconate) Soap before surgery.  CHG is an antiseptic cleaner which kills germs and bonds with the skin to continue killing germs even after washing.  Please do not use if you have an allergy to CHG or antibacterial soaps. If your skin becomes reddened/irritated stop using the CHG.  Do not shave (including legs and underarms) for at least 48 hours prior to first CHG shower. It is OK to shave your face.  Please follow these instructions carefully.   1. Shower the NIGHT BEFORE SURGERY and the MORNING OF SURGERY with CHG.   2. If you chose to wash your hair, wash your hair first as usual with your normal shampoo.  3. After you shampoo, rinse your hair and body thoroughly to remove the shampoo.  4. Use CHG as you would any other liquid soap. You can apply CHG directly to the skin and wash gently with a scrungie or a clean washcloth.   5. Apply the CHG Soap to your body ONLY FROM THE NECK DOWN.  Do not use on open wounds or open sores. Avoid contact with your eyes, ears, mouth and genitals (private parts). Wash Face and genitals (private parts)  with your normal soap.  6. Wash thoroughly, paying special attention to the area where your surgery will be performed.  7. Thoroughly rinse your body with warm water from the neck down.  8. DO NOT shower/wash with your normal soap after using and rinsing off the CHG Soap.  9. Pat yourself dry with a CLEAN TOWEL.  10. Wear CLEAN PAJAMAS to bed the night before surgery, wear comfortable clothes the morning of surgery  11. Place CLEAN SHEETS on your bed the night of your first shower and DO NOT SLEEP WITH PETS.    Day of Surgery: Do not apply any deodorants/lotions. Please wear clean clothes to the hospital/surgery center.      Please read over the following fact sheets that you were  given. Coughing and Deep Breathing, MRSA Information and Surgical Site Infection Prevention

## 2017-04-14 NOTE — Telephone Encounter (Signed)
Call from patient c/o severe pain in both leg right greater than left. Little to no pain relief with Oxycodone. Claims color and temperature in both legs is unchanged, denies signs of  Infection. Pending Surgery for 11/21. Per Dr. Stephens Shire advice patient instucted to let legs dangle and go to ER. For pain control.

## 2017-04-15 ENCOUNTER — Encounter (HOSPITAL_COMMUNITY): Payer: Self-pay

## 2017-04-15 ENCOUNTER — Other Ambulatory Visit: Payer: Self-pay

## 2017-04-15 ENCOUNTER — Encounter (HOSPITAL_COMMUNITY)
Admission: RE | Admit: 2017-04-15 | Discharge: 2017-04-15 | Disposition: A | Discharge status: Home / Self Care | Payer: Medicare Other | Source: Ambulatory Visit | Attending: Surgery | Admitting: Surgery

## 2017-04-15 ENCOUNTER — Telehealth (INDEPENDENT_AMBULATORY_CARE_PROVIDER_SITE_OTHER): Payer: Self-pay | Admitting: Orthopedic Surgery

## 2017-04-15 ENCOUNTER — Other Ambulatory Visit (INDEPENDENT_AMBULATORY_CARE_PROVIDER_SITE_OTHER): Payer: Self-pay | Admitting: Orthopedic Surgery

## 2017-04-15 DIAGNOSIS — E875 Hyperkalemia: Secondary | ICD-10-CM | POA: Diagnosis not present

## 2017-04-15 DIAGNOSIS — I509 Heart failure, unspecified: Secondary | ICD-10-CM | POA: Insufficient documentation

## 2017-04-15 DIAGNOSIS — E1122 Type 2 diabetes mellitus with diabetic chronic kidney disease: Secondary | ICD-10-CM | POA: Insufficient documentation

## 2017-04-15 DIAGNOSIS — Z992 Dependence on renal dialysis: Secondary | ICD-10-CM | POA: Insufficient documentation

## 2017-04-15 DIAGNOSIS — I272 Pulmonary hypertension, unspecified: Secondary | ICD-10-CM | POA: Insufficient documentation

## 2017-04-15 DIAGNOSIS — I739 Peripheral vascular disease, unspecified: Secondary | ICD-10-CM | POA: Insufficient documentation

## 2017-04-15 DIAGNOSIS — Z01812 Encounter for preprocedural laboratory examination: Secondary | ICD-10-CM | POA: Insufficient documentation

## 2017-04-15 DIAGNOSIS — N2581 Secondary hyperparathyroidism of renal origin: Secondary | ICD-10-CM | POA: Diagnosis not present

## 2017-04-15 DIAGNOSIS — N186 End stage renal disease: Secondary | ICD-10-CM | POA: Insufficient documentation

## 2017-04-15 DIAGNOSIS — I110 Hypertensive heart disease with heart failure: Secondary | ICD-10-CM | POA: Insufficient documentation

## 2017-04-15 DIAGNOSIS — I2510 Atherosclerotic heart disease of native coronary artery without angina pectoris: Secondary | ICD-10-CM | POA: Insufficient documentation

## 2017-04-15 DIAGNOSIS — D631 Anemia in chronic kidney disease: Secondary | ICD-10-CM | POA: Diagnosis not present

## 2017-04-15 DIAGNOSIS — E785 Hyperlipidemia, unspecified: Secondary | ICD-10-CM | POA: Insufficient documentation

## 2017-04-15 DIAGNOSIS — E119 Type 2 diabetes mellitus without complications: Secondary | ICD-10-CM | POA: Insufficient documentation

## 2017-04-15 DIAGNOSIS — Z87891 Personal history of nicotine dependence: Secondary | ICD-10-CM | POA: Insufficient documentation

## 2017-04-15 DIAGNOSIS — Z9889 Other specified postprocedural states: Secondary | ICD-10-CM | POA: Insufficient documentation

## 2017-04-15 HISTORY — DX: Cardiac murmur, unspecified: R01.1

## 2017-04-15 LAB — COMPREHENSIVE METABOLIC PANEL
ALT: 15 U/L (ref 14–54)
AST: 20 U/L (ref 15–41)
Albumin: 2.8 g/dL — ABNORMAL LOW (ref 3.5–5.0)
Alkaline Phosphatase: 449 U/L — ABNORMAL HIGH (ref 38–126)
Anion gap: 13 (ref 5–15)
BUN: 20 mg/dL (ref 6–20)
CHLORIDE: 94 mmol/L — AB (ref 101–111)
CO2: 31 mmol/L (ref 22–32)
Calcium: 9.1 mg/dL (ref 8.9–10.3)
Creatinine, Ser: 3.61 mg/dL — ABNORMAL HIGH (ref 0.44–1.00)
GFR, EST AFRICAN AMERICAN: 17 mL/min — AB (ref >60)
GFR, EST NON AFRICAN AMERICAN: 15 mL/min — AB (ref >60)
Glucose, Bld: 266 mg/dL — ABNORMAL HIGH (ref 65–99)
POTASSIUM: 3.6 mmol/L (ref 3.5–5.1)
SODIUM: 138 mmol/L (ref 135–145)
Total Bilirubin: 0.7 mg/dL (ref 0.3–1.2)
Total Protein: 7 g/dL (ref 6.5–8.1)

## 2017-04-15 LAB — PROTIME-INR
INR: 1.14
PROTHROMBIN TIME: 14.5 s (ref 11.4–15.2)

## 2017-04-15 LAB — CBC
HCT: 32.4 % — ABNORMAL LOW (ref 36.0–46.0)
Hemoglobin: 10.1 g/dL — ABNORMAL LOW (ref 12.0–15.0)
MCH: 29 pg (ref 26.0–34.0)
MCHC: 31.2 g/dL (ref 30.0–36.0)
MCV: 93.1 fL (ref 78.0–100.0)
PLATELETS: 221 10*3/uL (ref 150–400)
RBC: 3.48 MIL/uL — ABNORMAL LOW (ref 3.87–5.11)
RDW: 18.3 % — ABNORMAL HIGH (ref 11.5–15.5)
WBC: 10.6 10*3/uL — AB (ref 4.0–10.5)

## 2017-04-15 LAB — APTT: aPTT: 39 seconds — ABNORMAL HIGH (ref 24–36)

## 2017-04-15 LAB — TYPE AND SCREEN
ABO/RH(D): A POS
ANTIBODY SCREEN: NEGATIVE

## 2017-04-15 LAB — SURGICAL PCR SCREEN
MRSA, PCR: NEGATIVE
Staphylococcus aureus: NEGATIVE

## 2017-04-15 LAB — GLUCOSE, CAPILLARY: GLUCOSE-CAPILLARY: 276 mg/dL — AB (ref 65–99)

## 2017-04-15 NOTE — Progress Notes (Addendum)
Anesthesia PAT Evaluation: Patient is a 41 year old female scheduled for right femoral endarterectomy, right femoral atherectomy on 04/16/17 by Dr. Harold Barban. She has DM, ESRD, PVD with non-healing foot wounds with non-healing right toe ulcers with gangrene. (Of note, she is also seeing orthopedic surgeon Dr. Meridee Score for right foot gangrene. His 04/10/17 note mentions doing transmetatarsal amputation at that time of her vascular surgery versus seeing after surgery to determine timing. Patient and PAT RN are attempting to clarify with surgeons. UPDATE: Per notation by Rozell Searing, Dr. Sharol Given to do TMA at the time of revascularization.)  History includes PVD, former smoker (quit '13), DM2 on insulin (with gastroparesis, peripheral neuropathy, Charcot joint disease), HTN, HLD, CAD, CHF, hemophilia A carrier, ESRD (hemodialysis MWF X 4 h 15 min; [facility] [place], left radiocephalic AVF), anemia, migraines, ORIF right ankle 02/16/15.  - Admission 03/24/17 - 03/29/17 for right foot cellulitis. ID consulted for antibiotic coverage. Vascular and orthopedic surgery also consulted. Vascular surgeon Dr. Adele Barthel was going to attempt percutaneous RLE arterial intervention, but procedure was aborted due to development of left groin hematoma at femoral sheath site felt due to heavy calcification of the left CFA.  - Admission 02/14/16-02/24/16 for ascites and pericardial effusion (without tamponade), likely decompensated right sided CHF. S/p therapeutic paracentesis 02/15/16 and 02/21/16. Cardiologist Dr. Eli Phillips [facility]) consulted as well as Dr. Glori Bickers (Advanced HF). Found to have biventricular failure with acute on chronic diastolic heart failure, pulmonary hypertension with RV failure. RHC showed mild to moderate pulmonary hypertension due to restrictive physiology and high output HF. Dr. 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 She was not a candidate for cardiac MRI with ESRD (but could consider fat pad biopsy if need further amyloid work-up indicated in the future). [facility] also consulted (Dr. Jodelle Red). 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 Acute hepatitis panel negative. Volume managed with dialysis. Aggressive BP control recommended.  - PCP is Dr. Merrilee Seashore. - Nephrologist is Dr. Jamal Maes. Last HD 04/15/17. - Primary cardiologist is Dr. Adrian Prows [facility]). Last visit 02/21/17. Following her 01/2016 admission, she was also referred to [facility] and [facility] cardiologists  (incluidng [facility] Clinic). She was seen by Dr. Adan Sis ([place]) on 06/05/16.  She was later seen by Dr. Frankey Poot ([place]) as part of pre-operative renal transplant evaluation. On 10/15/16 he wrote, "Ms. Sharmin Foulk has important risk factors for atherosclerosis, including end-stage renal disease, diabetes, and hypertension, but coronary angiography in 2016 did not demonstrate flow limiting lesions. Her left ventricular function is normal. She has pulmonary hypertension, but this is due primarily to a high cardiac output and high left-sided filling pressures. Although her diabetes and coronary disease contribute to her risk of cardiovascular complications associated with surgery, this risk is not prohibitive and she is a satisfactory candidate for kidney transplantation." Dr. Irven Shelling notes indicate it "was recommended to accept high BP as a baseline" and that she "is already on maximum dose of medications." She was not having any acute HF symptoms or angina at that time. Six month follow-up recommended.   BP (!) 188/78 Comment: manual  Pulse 94   Temp 37.6 C (Oral)   Resp 18   Ht 6\' 4"  (1.93 m)   Wt 201 lb 4.5 oz (91.3 kg)   LMP 10/09/2015 Comment: pt on dialysis  SpO2 98%   BMI 24.50 kg/m  BP 186/76. Patient seen in a hospital wheelchair. Husband is  at side. She came straight from hemodialysis. Patient was somewhat drowsy, but easily arousable and able to answer questions appropriately. She denied any new CV/CHF symptoms since seeing Dr. Einar Gip in late September. She is not able to be very active since her feet issues with pain and ulcerations. She has an adjustable mattress and sleeps at a "slight" incline (sounds like 30 degrees or less). She denied history of OSA and does not require home oxygen. She does have some mild lower extremity edema. Last paracentesis 01/2016. Heart RRR, II/VI SEM. Lungs clear. Skin is pale. LUE AVF present.   Meds include amlodipine, Augmentin, aspirin 81 mg, Lipitor, Sensipar, clonidine, Flexeril, ferric citrate, Humalog 50/50, hydralazine, Norco, Humalog, Imdur, labetalol, methyldopa, Pred Forte ophthalmic, Aldactone, tramadol, Rena-vit.  EKG 02/17/17 [facility]): SR, LAE, LAD, LAFB, poor R-wave progression, LVH with repolarization abnormality. No significant change from 06/06/16 and 09/20/15 tracing (per Dr. Einar Gip)  Echo 10/15/16 ([place] [place]):  Left ventricular hypertrophy - moderate  Normal left ventricular systolic function, ejection fraction > 55%  Mitral annular calcification  Degenerative mitral valve disease  Mitral regurgitation - mild  Dilated left atrium - moderate  Aortic sclerosis  Aortic regurgitation - mild  Elevated pulmonary artery systolic pressure - moderate  Dilated right ventricle - mild  Normal right ventricular systolic function  Tricuspid regurgitation - mild  Dilated right atrium - mild  Pericardial effusion - small  Nuclear stress test 09/23/16 [facility]): 1. Resting EKG demonstrates normal sinus rhythm, left axis deviation, left anterior fascicular block. LVH with repolarization abnormalities in high lateral leads. Stress EKG is nondiagnostic for ischemia as this is a pharmacologic stress test. Stress symptoms included dyspnea and dizziness. Normal  blood pressure response. Resting blood pressure 152/64. Stress blood pressure 146/62. 2. Left ventricular cavity is noted to be enlarged on the rest and stress studies, LV end-diastolic volume 400 mL. SPECT images demonstrate small perfusion abnormality of mild intensity in the basal anterior, mid anterior and apical anterior myocardial walls on the stress images. The defect remains relatively unchanged between rest and stress images and is a soft tissue attenuation artifact. Gated SPECT images reveal normal myocardial thickening and wall motion. Left ventricular ejection fraction was calculated or visually estimated be 50%. Compared to the study done on 10/28/14, previously EF was estimated to be 43%. Otherwise no significant change. This is a low risk study.  [place] 02/21/16 (Dr. Haroldine Laws): Findings: RA = 12 RV = 66/17 PA = 64/21 (39) PCW = 21 (v wave = 28) Fick cardiac output/index = 7.7/3.4 Thermodilution CO/CI = 9.9/4.5 PVR = 2.0 WU FA sat = 98% PA sat = 63%, 64% High SVC sat = 72% IVC sat  = 63% RA sat  = 67% Assessment: 1. Mild to moderate pulmonary HTN due to restrictive physiology and high output HF due to upper extremity AVF 2. Mildly elevated left sided-pressures Plan/Discussion: Continue current management with aggressive attempts at BP management and volume control with HD.  There is room to push volume removal with HD. No role for selective pulmonary vasodilators. D/w Dr. Einar Gip.   LHC 02/10/15 (Dr. Doylene Canard):  Ost Cx to Prox Cx lesion, 35% stenosed. The lesion was not previously treated.  Ost LAD to Prox LAD lesion, 20% stenosed. The lesion was not previously treated.  Prox Cx to Mid Cx lesion, 25% stenosed. The lesion was not previously treated.  RPDA lesion, 50% stenosed. The lesion was not  previously treated.  Prox RCA lesion, 20% stenosed. The lesion was not previously treated.  May undergo ankle surgery.  Carotid U/S 03/27/17: Final Interpretation: Right Carotid: There  is evidence in the right ICA of a 40-59% stenosis. Left Carotid: There is evidence in the left ICA of a 40-59% stenosis. Vertebrals: Both vertebral arteries were patent with antegrade flow.  Peripheral arteriogram 04/08/17: Impression: Because of the high-grade stenosis in the common femoral artery, the decision has been made to proceed with right common femoral endarterectomy.  At that time an attempt at percutaneous revascularization using atherectomy device of the popliteal and superficial femoral artery will be performed.  This will be done in the operating room at a later date.  CXR 02/02/17: IMPRESSION: 1. Cardiomegaly with central pulmonary vascular congestion and probable mild bibasilar interstitial edema suggesting mild CHF/volume overload, possibly chronic. No overt alveolar pulmonary edema. 2. No pleural effusion or pneumothorax seen. 3. No osseous fracture or dislocation seen.  Preoperative labs noted. WBC 10.6, H/H 10.1/32.4, PLT 221. INR 1.14, PTT 39. AST/ALT WNL. Non-fasting glucose 266 (CBG 276; ate just before PAT--croissant and half-and-half tea). Reports fasting CBGs run ~ 149. Last A1c 9.5 (down from 11.2 on 02/15/16) on 03/24/17. Non-reactive HIV screen 03/24/17. She will need an ISTAT4 on arrival due to ESRD.  Patient with complex medical history as outlined above. She was seen by her primary cardiologist within the past two months without acute changes. In May, she was cleared for renal transplant when indicated. She denied any recent changes in her CV/CHF status--no new symptoms. Her BP remains elevated, but notes indicate she is on optimized therapy. DM also poorly controlled, but A1c down from last year. She reports fasting CBGs < 200. She is tolerating 4 h 15 min hemodialysis treatments 3X/week. If no acute changes then I would anticipate that she can proceed as planned. Anesthesiologist to evaluate on the day of surgery.  George Hugh [facility] Short Stay  Center/Anesthesiology Phone [phone] 04/15/2017 3:22 PM

## 2017-04-15 NOTE — Progress Notes (Signed)
PCP: Dr. Ashby Dawes Cardiologist: Dr. Aura Dials requested Nephrologist: Dr. Lorrene Reid, saw last week   EKG: 09/23/16, Dr. Irven Shelling office CXR: 02-02-17 ECHO: 10/15/16 Care Everywhere Stress Test: Requested from Dr. Irven Shelling office Cardiac Cath: 01/2016  Fasting Blood Sugar- 149 Checks Blood Sugar 2 times a day CBG today 276, last at at noon, croissant, currently drink large cup of half sweet/half unsweet tea.  Had Dialysis, today, 04/15/17.  Pt unable to provide urine sample ordered, reports she "doesn't make much urine."  Patient denies shortness of breath, fever, cough, and chest pain at PAT appointment.  Patient verbalized understanding of instructions provided today at the PAT appointment.  Patient asked to review instructions at home and day of surgery.   Chart to Anesthesia.

## 2017-04-15 NOTE — Telephone Encounter (Signed)
I called patient advised Dr. Trula Slade spoke with Dr. Sharol Given, he will do transmet amputation while Dr. Sharol Given does revascularization.

## 2017-04-15 NOTE — Telephone Encounter (Signed)
Finding out where Dr. Trula Slade is doing the surgery.  If this is being done in the vascular lab then the amputation surgery would have to be at a later date.

## 2017-04-15 NOTE — Telephone Encounter (Signed)
Patient called stating that Dr. Sharol Given had discussed with her about talking to the Vascular Surgeon about removing her toe or toes during the same procedure.  She stated that she has not heard anything more in that regard.  CB#[phone].  Thank you.

## 2017-04-15 NOTE — Anesthesia Preprocedure Evaluation (Addendum)
Anesthesia Evaluation  Patient identified by MRN, date of birth, ID band Patient awake    Reviewed: Allergy & Precautions, NPO status , Patient's Chart, lab work & pertinent test results, reviewed documented beta blocker date and time   Airway Mallampati: II  TM Distance: >3 FB Neck ROM: Full    Dental no notable dental hx. (+) Dental Advisory Given   Pulmonary neg COPD, former smoker,    Pulmonary exam normal breath sounds clear to auscultation       Cardiovascular hypertension, Pt. on medications and Pt. on home beta blockers + CAD, + Peripheral Vascular Disease and +CHF  Normal cardiovascular exam Rhythm:Regular Rate:Normal + Systolic murmurs EKG - QTc 492, inc RBBB  Cartoid [place] - 40-59% bilateral ICAS  Right heart cath - 1. Mild to moderate pulmonary HTN due to restrictive physiology and high output HF due to upper extremity AVF 2. Mildly elevated left sided-pressures  TTE 2017 - Severe concentric LVH. Ejection   fraction was in the range of 55% to 60%. Grade 3 diastolic dysfunction. Trivial AI, Mild MR, severely dilated LA. Moderate RVH. RA was moderately dilated. Pulmonary arteries: PA peak pressure: 62 mm Hg (S). Pericardium, extracardiac: A small to moderate, free-flowing pericardial effusion was identified.   Neuro/Psych  Headaches, negative psych ROS   GI/Hepatic negative GI ROS, Neg liver ROS,   Endo/Other  diabetes, Poorly Controlled, Type 2, Insulin DependentPOC Glu 314. 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   Anesthesia Other Findings   Reproductive/Obstetrics negative OB ROS                            Anesthesia Physical  Anesthesia Plan  ASA: III  Anesthesia Plan: General   Post-op Pain Management:     Induction: Intravenous  PONV Risk Score and Plan: 4 or greater and Treatment may vary due to age or medical condition, Ondansetron and Midazolam  Airway Management Planned: Oral ETT  Additional Equipment: Arterial line  Intra-op Plan:   Post-operative Plan: Extubation in OR  Informed Consent: I have reviewed the patients History and Physical, chart, labs and discussed the procedure including the risks, benefits and alternatives for the proposed anesthesia with the patient or authorized representative who has indicated his/her understanding and acceptance.   Dental advisory given  Plan Discussed with: CRNA  Anesthesia Plan Comments: (Two large bore IV's for case. Will run insulin infusion in case to slowly bring down glucose)      Anesthesia Quick Evaluation

## 2017-04-15 NOTE — Progress Notes (Signed)
Called Dr. Jess Barters office requesting orders for consent.

## 2017-04-16 ENCOUNTER — Inpatient Hospital Stay (HOSPITAL_COMMUNITY): Payer: Medicare Other

## 2017-04-16 ENCOUNTER — Encounter (HOSPITAL_COMMUNITY)
Admission: RE | Disposition: A | Discharge status: Nursing Home (Includes ICF) | Payer: Self-pay | Source: Home / Self Care | Attending: Surgery

## 2017-04-16 ENCOUNTER — Inpatient Hospital Stay (HOSPITAL_COMMUNITY)
Admission: RE | Admit: 2017-04-16 | Discharge: 2017-04-28 | DRG: 003 | Disposition: A | Discharge status: Other Acute Inpatient Hospital | Payer: Medicare Other | Attending: Surgery | Admitting: Surgery

## 2017-04-16 ENCOUNTER — Encounter (HOSPITAL_COMMUNITY): Payer: Self-pay | Admitting: Interventional Radiology

## 2017-04-16 ENCOUNTER — Inpatient Hospital Stay (HOSPITAL_COMMUNITY): Payer: Medicare Other | Admitting: Anesthesiology

## 2017-04-16 ENCOUNTER — Inpatient Hospital Stay (HOSPITAL_COMMUNITY): Payer: Medicare Other | Admitting: Vascular Surgery

## 2017-04-16 DIAGNOSIS — Z930 Tracheostomy status: Secondary | ICD-10-CM | POA: Diagnosis not present

## 2017-04-16 DIAGNOSIS — I2601 Septic pulmonary embolism with acute cor pulmonale: Secondary | ICD-10-CM | POA: Diagnosis not present

## 2017-04-16 DIAGNOSIS — E1065 Type 1 diabetes mellitus with hyperglycemia: Secondary | ICD-10-CM | POA: Diagnosis present

## 2017-04-16 DIAGNOSIS — E781 Pure hyperglyceridemia: Secondary | ICD-10-CM | POA: Diagnosis present

## 2017-04-16 DIAGNOSIS — R0602 Shortness of breath: Secondary | ICD-10-CM | POA: Diagnosis not present

## 2017-04-16 DIAGNOSIS — L89322 Pressure ulcer of left buttock, stage 2: Secondary | ICD-10-CM | POA: Diagnosis not present

## 2017-04-16 DIAGNOSIS — J80 Acute respiratory distress syndrome: Secondary | ICD-10-CM | POA: Diagnosis not present

## 2017-04-16 DIAGNOSIS — I2699 Other pulmonary embolism without acute cor pulmonale: Secondary | ICD-10-CM

## 2017-04-16 DIAGNOSIS — Z9911 Dependence on respirator [ventilator] status: Secondary | ICD-10-CM

## 2017-04-16 DIAGNOSIS — I472 Ventricular tachycardia: Secondary | ICD-10-CM | POA: Diagnosis not present

## 2017-04-16 DIAGNOSIS — I120 Hypertensive chronic kidney disease with stage 5 chronic kidney disease or end stage renal disease: Secondary | ICD-10-CM | POA: Diagnosis not present

## 2017-04-16 DIAGNOSIS — I10 Essential (primary) hypertension: Secondary | ICD-10-CM | POA: Diagnosis not present

## 2017-04-16 DIAGNOSIS — G9340 Encephalopathy, unspecified: Secondary | ICD-10-CM | POA: Diagnosis not present

## 2017-04-16 DIAGNOSIS — Z833 Family history of diabetes mellitus: Secondary | ICD-10-CM

## 2017-04-16 DIAGNOSIS — I2510 Atherosclerotic heart disease of native coronary artery without angina pectoris: Secondary | ICD-10-CM | POA: Diagnosis present

## 2017-04-16 DIAGNOSIS — E1151 Type 2 diabetes mellitus with diabetic peripheral angiopathy without gangrene: Secondary | ICD-10-CM | POA: Diagnosis not present

## 2017-04-16 DIAGNOSIS — J9601 Acute respiratory failure with hypoxia: Secondary | ICD-10-CM | POA: Diagnosis not present

## 2017-04-16 DIAGNOSIS — J9690 Respiratory failure, unspecified, unspecified whether with hypoxia or hypercapnia: Secondary | ICD-10-CM

## 2017-04-16 DIAGNOSIS — R451 Restlessness and agitation: Secondary | ICD-10-CM | POA: Diagnosis not present

## 2017-04-16 DIAGNOSIS — Z8349 Family history of other endocrine, nutritional and metabolic diseases: Secondary | ICD-10-CM

## 2017-04-16 DIAGNOSIS — Z882 Allergy status to sulfonamides status: Secondary | ICD-10-CM

## 2017-04-16 DIAGNOSIS — Z0189 Encounter for other specified special examinations: Secondary | ICD-10-CM

## 2017-04-16 DIAGNOSIS — Y92234 Operating room of hospital as the place of occurrence of the external cause: Secondary | ICD-10-CM | POA: Diagnosis not present

## 2017-04-16 DIAGNOSIS — Z7952 Long term (current) use of systemic steroids: Secondary | ICD-10-CM

## 2017-04-16 DIAGNOSIS — Z419 Encounter for procedure for purposes other than remedying health state, unspecified: Secondary | ICD-10-CM

## 2017-04-16 DIAGNOSIS — R188 Other ascites: Secondary | ICD-10-CM | POA: Diagnosis present

## 2017-04-16 DIAGNOSIS — J9811 Atelectasis: Secondary | ICD-10-CM | POA: Diagnosis not present

## 2017-04-16 DIAGNOSIS — E1043 Type 1 diabetes mellitus with diabetic autonomic (poly)neuropathy: Secondary | ICD-10-CM | POA: Diagnosis present

## 2017-04-16 DIAGNOSIS — I469 Cardiac arrest, cause unspecified: Secondary | ICD-10-CM | POA: Diagnosis not present

## 2017-04-16 DIAGNOSIS — S225XXA Flail chest, initial encounter for closed fracture: Secondary | ICD-10-CM | POA: Diagnosis not present

## 2017-04-16 DIAGNOSIS — S2220XA Unspecified fracture of sternum, initial encounter for closed fracture: Secondary | ICD-10-CM | POA: Diagnosis not present

## 2017-04-16 DIAGNOSIS — Z881 Allergy status to other antibiotic agents status: Secondary | ICD-10-CM

## 2017-04-16 DIAGNOSIS — R918 Other nonspecific abnormal finding of lung field: Secondary | ICD-10-CM | POA: Diagnosis not present

## 2017-04-16 DIAGNOSIS — R571 Hypovolemic shock: Secondary | ICD-10-CM | POA: Diagnosis not present

## 2017-04-16 DIAGNOSIS — R570 Cardiogenic shock: Secondary | ICD-10-CM | POA: Diagnosis not present

## 2017-04-16 DIAGNOSIS — E1022 Type 1 diabetes mellitus with diabetic chronic kidney disease: Secondary | ICD-10-CM | POA: Diagnosis present

## 2017-04-16 DIAGNOSIS — B829 Intestinal parasitism, unspecified: Secondary | ICD-10-CM | POA: Diagnosis not present

## 2017-04-16 DIAGNOSIS — D631 Anemia in chronic kidney disease: Secondary | ICD-10-CM | POA: Diagnosis present

## 2017-04-16 DIAGNOSIS — E1129 Type 2 diabetes mellitus with other diabetic kidney complication: Secondary | ICD-10-CM | POA: Diagnosis not present

## 2017-04-16 DIAGNOSIS — I5032 Chronic diastolic (congestive) heart failure: Secondary | ICD-10-CM | POA: Diagnosis present

## 2017-04-16 DIAGNOSIS — L8990 Pressure ulcer of unspecified site, unspecified stage: Secondary | ICD-10-CM

## 2017-04-16 DIAGNOSIS — T17990A Other foreign object in respiratory tract, part unspecified in causing asphyxiation, initial encounter: Secondary | ICD-10-CM | POA: Diagnosis not present

## 2017-04-16 DIAGNOSIS — K3184 Gastroparesis: Secondary | ICD-10-CM | POA: Diagnosis present

## 2017-04-16 DIAGNOSIS — J90 Pleural effusion, not elsewhere classified: Secondary | ICD-10-CM | POA: Diagnosis not present

## 2017-04-16 DIAGNOSIS — I509 Heart failure, unspecified: Secondary | ICD-10-CM | POA: Diagnosis not present

## 2017-04-16 DIAGNOSIS — Z9289 Personal history of other medical treatment: Secondary | ICD-10-CM | POA: Diagnosis not present

## 2017-04-16 DIAGNOSIS — R0989 Other specified symptoms and signs involving the circulatory and respiratory systems: Secondary | ICD-10-CM | POA: Diagnosis present

## 2017-04-16 DIAGNOSIS — Z8249 Family history of ischemic heart disease and other diseases of the circulatory system: Secondary | ICD-10-CM

## 2017-04-16 DIAGNOSIS — Z978 Presence of other specified devices: Secondary | ICD-10-CM

## 2017-04-16 DIAGNOSIS — R740 Nonspecific elevation of levels of transaminase and lactic acid dehydrogenase [LDH]: Secondary | ICD-10-CM | POA: Diagnosis not present

## 2017-04-16 DIAGNOSIS — E874 Mixed disorder of acid-base balance: Secondary | ICD-10-CM | POA: Diagnosis present

## 2017-04-16 DIAGNOSIS — Z4659 Encounter for fitting and adjustment of other gastrointestinal appliance and device: Secondary | ICD-10-CM

## 2017-04-16 DIAGNOSIS — Z992 Dependence on renal dialysis: Secondary | ICD-10-CM | POA: Diagnosis not present

## 2017-04-16 DIAGNOSIS — E1122 Type 2 diabetes mellitus with diabetic chronic kidney disease: Secondary | ICD-10-CM | POA: Diagnosis not present

## 2017-04-16 DIAGNOSIS — E1052 Type 1 diabetes mellitus with diabetic peripheral angiopathy with gangrene: Secondary | ICD-10-CM | POA: Diagnosis not present

## 2017-04-16 DIAGNOSIS — I96 Gangrene, not elsewhere classified: Secondary | ICD-10-CM | POA: Diagnosis present

## 2017-04-16 DIAGNOSIS — N2581 Secondary hyperparathyroidism of renal origin: Secondary | ICD-10-CM | POA: Diagnosis not present

## 2017-04-16 DIAGNOSIS — X58XXXA Exposure to other specified factors, initial encounter: Secondary | ICD-10-CM | POA: Diagnosis not present

## 2017-04-16 DIAGNOSIS — L89152 Pressure ulcer of sacral region, stage 2: Secondary | ICD-10-CM | POA: Diagnosis not present

## 2017-04-16 DIAGNOSIS — N281 Cyst of kidney, acquired: Secondary | ICD-10-CM | POA: Diagnosis not present

## 2017-04-16 DIAGNOSIS — Z961 Presence of intraocular lens: Secondary | ICD-10-CM | POA: Diagnosis present

## 2017-04-16 DIAGNOSIS — A419 Sepsis, unspecified organism: Secondary | ICD-10-CM | POA: Diagnosis not present

## 2017-04-16 DIAGNOSIS — I70201 Unspecified atherosclerosis of native arteries of extremities, right leg: Secondary | ICD-10-CM | POA: Diagnosis present

## 2017-04-16 DIAGNOSIS — K567 Ileus, unspecified: Secondary | ICD-10-CM | POA: Diagnosis not present

## 2017-04-16 DIAGNOSIS — D696 Thrombocytopenia, unspecified: Secondary | ICD-10-CM | POA: Diagnosis present

## 2017-04-16 DIAGNOSIS — Z86001 Personal history of in-situ neoplasm of cervix uteri: Secondary | ICD-10-CM

## 2017-04-16 DIAGNOSIS — E871 Hypo-osmolality and hyponatremia: Secondary | ICD-10-CM | POA: Diagnosis present

## 2017-04-16 DIAGNOSIS — N186 End stage renal disease: Secondary | ICD-10-CM | POA: Diagnosis not present

## 2017-04-16 DIAGNOSIS — M79671 Pain in right foot: Secondary | ICD-10-CM | POA: Diagnosis not present

## 2017-04-16 DIAGNOSIS — I132 Hypertensive heart and chronic kidney disease with heart failure and with stage 5 chronic kidney disease, or end stage renal disease: Secondary | ICD-10-CM | POA: Diagnosis not present

## 2017-04-16 DIAGNOSIS — Z7982 Long term (current) use of aspirin: Secondary | ICD-10-CM

## 2017-04-16 DIAGNOSIS — I272 Pulmonary hypertension, unspecified: Secondary | ICD-10-CM | POA: Diagnosis present

## 2017-04-16 DIAGNOSIS — Z1401 Asymptomatic hemophilia A carrier: Secondary | ICD-10-CM

## 2017-04-16 DIAGNOSIS — I2782 Chronic pulmonary embolism: Secondary | ICD-10-CM

## 2017-04-16 DIAGNOSIS — R0902 Hypoxemia: Secondary | ICD-10-CM | POA: Diagnosis not present

## 2017-04-16 DIAGNOSIS — E876 Hypokalemia: Secondary | ICD-10-CM | POA: Diagnosis present

## 2017-04-16 DIAGNOSIS — Z9842 Cataract extraction status, left eye: Secondary | ICD-10-CM

## 2017-04-16 DIAGNOSIS — I739 Peripheral vascular disease, unspecified: Secondary | ICD-10-CM

## 2017-04-16 DIAGNOSIS — S2241XA Multiple fractures of ribs, right side, initial encounter for closed fracture: Secondary | ICD-10-CM | POA: Diagnosis not present

## 2017-04-16 DIAGNOSIS — E785 Hyperlipidemia, unspecified: Secondary | ICD-10-CM | POA: Diagnosis present

## 2017-04-16 DIAGNOSIS — Z794 Long term (current) use of insulin: Secondary | ICD-10-CM

## 2017-04-16 DIAGNOSIS — E10610 Type 1 diabetes mellitus with diabetic neuropathic arthropathy: Secondary | ICD-10-CM | POA: Diagnosis present

## 2017-04-16 DIAGNOSIS — Z4682 Encounter for fitting and adjustment of non-vascular catheter: Secondary | ICD-10-CM | POA: Diagnosis not present

## 2017-04-16 DIAGNOSIS — Z87891 Personal history of nicotine dependence: Secondary | ICD-10-CM

## 2017-04-16 DIAGNOSIS — E872 Acidosis: Secondary | ICD-10-CM

## 2017-04-16 DIAGNOSIS — I97711 Intraoperative cardiac arrest during other surgery: Secondary | ICD-10-CM | POA: Diagnosis not present

## 2017-04-16 DIAGNOSIS — Z79899 Other long term (current) drug therapy: Secondary | ICD-10-CM

## 2017-04-16 DIAGNOSIS — I468 Cardiac arrest due to other underlying condition: Secondary | ICD-10-CM | POA: Diagnosis not present

## 2017-04-16 DIAGNOSIS — J9809 Other diseases of bronchus, not elsewhere classified: Secondary | ICD-10-CM | POA: Diagnosis not present

## 2017-04-16 DIAGNOSIS — Z4589 Encounter for adjustment and management of other implanted devices: Secondary | ICD-10-CM | POA: Diagnosis not present

## 2017-04-16 DIAGNOSIS — I70239 Atherosclerosis of native arteries of right leg with ulceration of unspecified site: Secondary | ICD-10-CM | POA: Diagnosis not present

## 2017-04-16 DIAGNOSIS — I441 Atrioventricular block, second degree: Secondary | ICD-10-CM | POA: Diagnosis not present

## 2017-04-16 DIAGNOSIS — D62 Acute posthemorrhagic anemia: Secondary | ICD-10-CM | POA: Diagnosis not present

## 2017-04-16 DIAGNOSIS — Z9841 Cataract extraction status, right eye: Secondary | ICD-10-CM

## 2017-04-16 DIAGNOSIS — Z803 Family history of malignant neoplasm of breast: Secondary | ICD-10-CM

## 2017-04-16 HISTORY — DX: Unspecified convulsions: R56.9

## 2017-04-16 HISTORY — PX: IR HYBRID TRAUMA EMBOLIZATION: IMG5539

## 2017-04-16 HISTORY — PX: ENDARTERECTOMY FEMORAL: SHX5804

## 2017-04-16 HISTORY — PX: PATCH ANGIOPLASTY: SHX6230

## 2017-04-16 HISTORY — PX: INSERTION OF DIALYSIS CATHETER: SHX1324

## 2017-04-16 HISTORY — DX: Unspecified osteoarthritis, unspecified site: M19.90

## 2017-04-16 LAB — POCT I-STAT 7, (LYTES, BLD GAS, ICA,H+H)
ACID-BASE DEFICIT: 3 mmol/L — AB (ref 0.0–2.0)
ACID-BASE DEFICIT: 8 mmol/L — AB (ref 0.0–2.0)
Acid-Base Excess: 8 mmol/L — ABNORMAL HIGH (ref 0.0–2.0)
Acid-base deficit: 12 mmol/L — ABNORMAL HIGH (ref 0.0–2.0)
BICARBONATE: 31.7 mmol/L — AB (ref 20.0–28.0)
BICARBONATE: 22.5 mmol/L (ref 20.0–28.0)
BICARBONATE: 22.7 mmol/L (ref 20.0–28.0)
Bicarbonate: 18.3 mmol/L — ABNORMAL LOW (ref 20.0–28.0)
CALCIUM ION: 0.97 mmol/L — AB (ref 1.15–1.40)
Calcium, Ion: 1.13 mmol/L — ABNORMAL LOW (ref 1.15–1.40)
Calcium, Ion: 1.04 mmol/L — ABNORMAL LOW (ref 1.15–1.40)
Calcium, Ion: 1.1 mmol/L — ABNORMAL LOW (ref 1.15–1.40)
HCT: 26 % — ABNORMAL LOW (ref 36.0–46.0)
HCT: 27 % — ABNORMAL LOW (ref 36.0–46.0)
HCT: 32 % — ABNORMAL LOW (ref 36.0–46.0)
HEMATOCRIT: 29 % — AB (ref 36.0–46.0)
Hemoglobin: 8.8 g/dL — ABNORMAL LOW (ref 12.0–15.0)
Hemoglobin: 10.9 g/dL — ABNORMAL LOW (ref 12.0–15.0)
Hemoglobin: 9.2 g/dL — ABNORMAL LOW (ref 12.0–15.0)
Hemoglobin: 9.9 g/dL — ABNORMAL LOW (ref 12.0–15.0)
O2 SAT: 80 %
O2 SAT: 66 %
O2 Saturation: 93 %
O2 Saturation: 74 %
PCO2 ART: 42.3 mmHg (ref 32.0–48.0)
PH ART: 7.335 — AB (ref 7.350–7.450)
PH ART: 7.111 — AB (ref 7.350–7.450)
PO2 ART: 66 mmHg — AB (ref 83.0–108.0)
PO2 ART: 47 mmHg — AB (ref 83.0–108.0)
Patient temperature: 37.9
Patient temperature: 35.2
Potassium: 3.8 mmol/L (ref 3.5–5.1)
Potassium: 4.5 mmol/L (ref 3.5–5.1)
Potassium: 4.8 mmol/L (ref 3.5–5.1)
Potassium: 5.5 mmol/L — ABNORMAL HIGH (ref 3.5–5.1)
SODIUM: 139 mmol/L (ref 135–145)
Sodium: 135 mmol/L (ref 135–145)
Sodium: 137 mmol/L (ref 135–145)
Sodium: 138 mmol/L (ref 135–145)
TCO2: 33 mmol/L — AB (ref 22–32)
TCO2: 20 mmol/L — ABNORMAL LOW (ref 22–32)
TCO2: 24 mmol/L (ref 22–32)
TCO2: 25 mmol/L (ref 22–32)
pCO2 arterial: 42.2 mmHg (ref 32.0–48.0)
pCO2 arterial: 61.9 mmHg — ABNORMAL HIGH (ref 32.0–48.0)
pCO2 arterial: 69.3 mmHg (ref 32.0–48.0)
pH, Arterial: 7.486 — ABNORMAL HIGH (ref 7.350–7.450)
pH, Arterial: 7.08 — CL (ref 7.350–7.450)
pO2, Arterial: 55 mmHg — ABNORMAL LOW (ref 83.0–108.0)
pO2, Arterial: 42 mmHg — ABNORMAL LOW (ref 83.0–108.0)

## 2017-04-16 LAB — POCT I-STAT 4, (NA,K, GLUC, HGB,HCT)
GLUCOSE: 200 mg/dL — AB (ref 65–99)
Glucose, Bld: 408 mg/dL — ABNORMAL HIGH (ref 65–99)
Glucose, Bld: 284 mg/dL — ABNORMAL HIGH (ref 65–99)
Glucose, Bld: 248 mg/dL — ABNORMAL HIGH (ref 65–99)
HCT: 29 % — ABNORMAL LOW (ref 36.0–46.0)
HCT: 24 % — ABNORMAL LOW (ref 36.0–46.0)
HEMATOCRIT: 25 % — AB (ref 36.0–46.0)
HEMATOCRIT: 24 % — AB (ref 36.0–46.0)
Hemoglobin: 9.9 g/dL — ABNORMAL LOW (ref 12.0–15.0)
Hemoglobin: 8.5 g/dL — ABNORMAL LOW (ref 12.0–15.0)
Hemoglobin: 8.2 g/dL — ABNORMAL LOW (ref 12.0–15.0)
Hemoglobin: 8.2 g/dL — ABNORMAL LOW (ref 12.0–15.0)
POTASSIUM: 3.7 mmol/L (ref 3.5–5.1)
POTASSIUM: 3.9 mmol/L (ref 3.5–5.1)
Potassium: 3.8 mmol/L (ref 3.5–5.1)
Potassium: 3.8 mmol/L (ref 3.5–5.1)
SODIUM: 135 mmol/L (ref 135–145)
Sodium: 136 mmol/L (ref 135–145)
Sodium: 136 mmol/L (ref 135–145)
Sodium: 136 mmol/L (ref 135–145)

## 2017-04-16 LAB — CBC
HCT: 25.2 % — ABNORMAL LOW (ref 36.0–46.0)
HEMATOCRIT: 26.6 % — AB (ref 36.0–46.0)
HEMOGLOBIN: 8.4 g/dL — AB (ref 12.0–15.0)
Hemoglobin: 8.2 g/dL — ABNORMAL LOW (ref 12.0–15.0)
MCH: 28.8 pg (ref 26.0–34.0)
MCH: 29.5 pg (ref 26.0–34.0)
MCHC: 31.6 g/dL (ref 30.0–36.0)
MCHC: 32.5 g/dL (ref 30.0–36.0)
MCV: 91.1 fL (ref 78.0–100.0)
MCV: 90.6 fL (ref 78.0–100.0)
PLATELETS: 174 10*3/uL (ref 150–400)
Platelets: 209 10*3/uL (ref 150–400)
RBC: 2.92 MIL/uL — ABNORMAL LOW (ref 3.87–5.11)
RBC: 2.78 MIL/uL — AB (ref 3.87–5.11)
RDW: 17.6 % — AB (ref 11.5–15.5)
RDW: 17.9 % — ABNORMAL HIGH (ref 11.5–15.5)
WBC: 14.7 10*3/uL — ABNORMAL HIGH (ref 4.0–10.5)
WBC: 8.9 10*3/uL (ref 4.0–10.5)

## 2017-04-16 LAB — RENAL FUNCTION PANEL
ALBUMIN: 2.1 g/dL — AB (ref 3.5–5.0)
ANION GAP: 12 (ref 5–15)
BUN: 31 mg/dL — ABNORMAL HIGH (ref 6–20)
CHLORIDE: 99 mmol/L — AB (ref 101–111)
CO2: 26 mmol/L (ref 22–32)
CREATININE: 4.04 mg/dL — AB (ref 0.44–1.00)
Calcium: 8.1 mg/dL — ABNORMAL LOW (ref 8.9–10.3)
GFR calc non Af Amer: 13 mL/min — ABNORMAL LOW (ref >60)
GFR, EST AFRICAN AMERICAN: 15 mL/min — AB (ref >60)
Glucose, Bld: 151 mg/dL — ABNORMAL HIGH (ref 65–99)
PHOSPHORUS: 5.5 mg/dL — AB (ref 2.5–4.6)
POTASSIUM: 3.7 mmol/L (ref 3.5–5.1)
Sodium: 137 mmol/L (ref 135–145)

## 2017-04-16 LAB — POCT I-STAT 3, ART BLOOD GAS (G3+)
Acid-Base Excess: 2 mmol/L (ref 0.0–2.0)
BICARBONATE: 26.5 mmol/L (ref 20.0–28.0)
O2 SAT: 97 %
PCO2 ART: 39.2 mmHg (ref 32.0–48.0)
PH ART: 7.432 (ref 7.350–7.450)
TCO2: 28 mmol/L (ref 22–32)
pO2, Arterial: 88 mmHg (ref 83.0–108.0)

## 2017-04-16 LAB — GLUCOSE, CAPILLARY
Glucose-Capillary: 122 mg/dL — ABNORMAL HIGH (ref 65–99)
Glucose-Capillary: 145 mg/dL — ABNORMAL HIGH (ref 65–99)
Glucose-Capillary: 170 mg/dL — ABNORMAL HIGH (ref 65–99)
Glucose-Capillary: 188 mg/dL — ABNORMAL HIGH (ref 65–99)
Glucose-Capillary: 314 mg/dL — ABNORMAL HIGH (ref 65–99)

## 2017-04-16 LAB — POCT ACTIVATED CLOTTING TIME
ACTIVATED CLOTTING TIME: 197 s
Activated Clotting Time: 180 seconds

## 2017-04-16 SURGERY — ENDARTERECTOMY, FEMORAL
Anesthesia: General | Site: Groin | Laterality: Right

## 2017-04-16 MED ORDER — MIDAZOLAM HCL 5 MG/5ML IJ SOLN
INTRAMUSCULAR | Status: DC | PRN
  Administered 2017-04-16 (×2): 2 mg via INTRAVENOUS

## 2017-04-16 MED ORDER — HYDRALAZINE HCL 20 MG/ML IJ SOLN
INTRAMUSCULAR | Status: AC
  Filled 2017-04-16: qty 1

## 2017-04-16 MED ORDER — CALCIUM CHLORIDE 10 % IV SOLN
INTRAVENOUS | Status: DC | PRN
  Administered 2017-04-16 (×2): 500 mg via INTRAVENOUS

## 2017-04-16 MED ORDER — LIDOCAINE HCL (CARDIAC) 20 MG/ML IV SOLN
INTRAVENOUS | Status: DC | PRN
  Administered 2017-04-16: 80 mg via INTRAVENOUS

## 2017-04-16 MED ORDER — SUCCINYLCHOLINE CHLORIDE 200 MG/10ML IV SOSY
PREFILLED_SYRINGE | INTRAVENOUS | Status: AC
  Filled 2017-04-16: qty 10

## 2017-04-16 MED ORDER — PRISMASOL BGK 4/2.5 32-4-2.5 MEQ/L IV SOLN
INTRAVENOUS | Status: DC
  Administered 2017-04-16 – 2017-04-17 (×3): via INTRAVENOUS_CENTRAL
  Filled 2017-04-16 (×10): qty 5000

## 2017-04-16 MED ORDER — EPHEDRINE 5 MG/ML INJ
INTRAVENOUS | Status: AC
  Filled 2017-04-16: qty 10

## 2017-04-16 MED ORDER — FENTANYL 2500MCG IN NS 250ML (10MCG/ML) PREMIX INFUSION
25.0000 ug/h | INTRAVENOUS | Status: DC
  Administered 2017-04-16: 300 ug/h via INTRAVENOUS
  Administered 2017-04-16: 50 ug/h via INTRAVENOUS
  Administered 2017-04-17: 350 ug/h via INTRAVENOUS
  Filled 2017-04-16 (×3): qty 250

## 2017-04-16 MED ORDER — ORAL CARE MOUTH RINSE
15.0000 mL | Freq: Four times a day (QID) | OROMUCOSAL | Status: DC
  Administered 2017-04-17 (×2): 15 mL via OROMUCOSAL

## 2017-04-16 MED ORDER — INSULIN ASPART 100 UNIT/ML ~~LOC~~ SOLN
2.0000 [IU] | SUBCUTANEOUS | Status: DC
  Administered 2017-04-16: 4 [IU] via SUBCUTANEOUS
  Administered 2017-04-16: 2 [IU] via SUBCUTANEOUS
  Administered 2017-04-18 (×2): 4 [IU] via SUBCUTANEOUS
  Administered 2017-04-19 (×2): 2 [IU] via SUBCUTANEOUS
  Administered 2017-04-19: 4 [IU] via SUBCUTANEOUS
  Administered 2017-04-20 – 2017-04-24 (×9): 2 [IU] via SUBCUTANEOUS
  Administered 2017-04-24 (×2): 4 [IU] via SUBCUTANEOUS
  Administered 2017-04-25: 2 [IU] via SUBCUTANEOUS
  Administered 2017-04-25: 6 [IU] via SUBCUTANEOUS
  Administered 2017-04-25: 4 [IU] via SUBCUTANEOUS
  Administered 2017-04-25: 2 [IU] via SUBCUTANEOUS
  Administered 2017-04-26 (×2): 6 [IU] via SUBCUTANEOUS
  Administered 2017-04-26: 4 [IU] via SUBCUTANEOUS
  Administered 2017-04-26 (×2): 6 [IU] via SUBCUTANEOUS
  Administered 2017-04-26 – 2017-04-27 (×2): 2 [IU] via SUBCUTANEOUS
  Administered 2017-04-27: 4 [IU] via SUBCUTANEOUS
  Administered 2017-04-27 (×2): 2 [IU] via SUBCUTANEOUS

## 2017-04-16 MED ORDER — NOREPINEPHRINE BITARTRATE 1 MG/ML IV SOLN
INTRAVENOUS | Status: DC | PRN
  Administered 2017-04-16: 5 ug/kg/min via INTRAVENOUS

## 2017-04-16 MED ORDER — SODIUM CHLORIDE 0.9 % IV SOLN
INTRAVENOUS | Status: DC | PRN
Start: 2017-04-16 — End: 2017-04-16
  Administered 2017-04-16 (×3): via INTRAVENOUS

## 2017-04-16 MED ORDER — SODIUM BICARBONATE 8.4 % IV SOLN
INTRAVENOUS | Status: DC | PRN
  Administered 2017-04-16 (×4): 25 meq via INTRAVENOUS

## 2017-04-16 MED ORDER — HEPARIN SODIUM (PORCINE) 1000 UNIT/ML IJ SOLN
INTRAMUSCULAR | Status: DC | PRN
  Administered 2017-04-16: 7800 [IU] via INTRAVENOUS

## 2017-04-16 MED ORDER — HYDRALAZINE HCL 20 MG/ML IJ SOLN
10.0000 mg | INTRAMUSCULAR | Status: DC | PRN
  Administered 2017-04-16 (×3): 10 mg via INTRAVENOUS
  Filled 2017-04-16 (×2): qty 1

## 2017-04-16 MED ORDER — IODIXANOL 320 MG/ML IV SOLN
INTRAVENOUS | Status: DC | PRN
  Administered 2017-04-16: 50 mL via INTRAVENOUS
  Administered 2017-04-16: 25 mL via INTRAVENOUS

## 2017-04-16 MED ORDER — VANCOMYCIN HCL IN DEXTROSE 1-5 GM/200ML-% IV SOLN
INTRAVENOUS | Status: AC
  Filled 2017-04-16: qty 200

## 2017-04-16 MED ORDER — HEPARIN SODIUM (PORCINE) 1000 UNIT/ML IJ SOLN
INTRAMUSCULAR | Status: DC | PRN
  Administered 2017-04-16: 3000 [IU] via INTRAVENOUS
  Administered 2017-04-16: 9000 [IU] via INTRAVENOUS
  Administered 2017-04-16: 1000 [IU] via INTRAVENOUS
  Administered 2017-04-16: 15000 [IU] via INTRAVENOUS

## 2017-04-16 MED ORDER — SODIUM BICARBONATE 8.4 % IV SOLN
INTRAVENOUS | Status: AC
  Administered 2017-04-16: 150 mL/h via INTRAVENOUS
  Filled 2017-04-16 (×2): qty 850

## 2017-04-16 MED ORDER — LEVOFLOXACIN IN D5W 750 MG/150ML IV SOLN
750.0000 mg | INTRAVENOUS | Status: DC
  Administered 2017-04-16: 750 mg via INTRAVENOUS
  Filled 2017-04-16: qty 150

## 2017-04-16 MED ORDER — LABETALOL HCL 5 MG/ML IV SOLN
10.0000 mg | INTRAVENOUS | Status: DC | PRN
  Administered 2017-04-17: 20 mg via INTRAVENOUS
  Filled 2017-04-16: qty 4

## 2017-04-16 MED ORDER — LACTATED RINGERS IV SOLN
INTRAVENOUS | Status: DC | PRN
Start: 2017-04-16 — End: 2017-04-16
  Administered 2017-04-16: 14:00:00 via INTRAVENOUS

## 2017-04-16 MED ORDER — FENTANYL CITRATE (PF) 100 MCG/2ML IJ SOLN
INTRAMUSCULAR | Status: DC | PRN
  Administered 2017-04-16: 100 ug via INTRAVENOUS

## 2017-04-16 MED ORDER — CHLORHEXIDINE GLUCONATE CLOTH 2 % EX PADS: 6.0000 | MEDICATED_PAD | Freq: Once | CUTANEOUS | Status: DC

## 2017-04-16 MED ORDER — DEXMEDETOMIDINE HCL IN NACL 400 MCG/100ML IV SOLN
0.0000 ug/kg/h | INTRAVENOUS | Status: DC
  Administered 2017-04-16: 1 ug/kg/h via INTRAVENOUS
  Administered 2017-04-16: 1.1 ug/kg/h via INTRAVENOUS
  Administered 2017-04-17 (×3): 1.2 ug/kg/h via INTRAVENOUS
  Filled 2017-04-16 (×5): qty 100

## 2017-04-16 MED ORDER — LACTATED RINGERS IV SOLN
INTRAVENOUS | Status: DC | PRN
  Administered 2017-04-16: 14:00:00 via INTRAVENOUS

## 2017-04-16 MED ORDER — HEPARIN SODIUM (PORCINE) 1000 UNIT/ML IJ SOLN
INTRAMUSCULAR | Status: AC
  Filled 2017-04-16: qty 2

## 2017-04-16 MED ORDER — EPINEPHRINE PF 1 MG/ML IJ SOLN
0.5000 ug/min | Freq: Once | INTRAVENOUS | Status: DC
  Filled 2017-04-16: qty 4

## 2017-04-16 MED ORDER — PHENYLEPHRINE HCL 10 MG/ML IJ SOLN
INTRAVENOUS | Status: DC | PRN
  Administered 2017-04-16: 20 ug/min via INTRAVENOUS

## 2017-04-16 MED ORDER — PRISMASOL BGK 4/2.5 32-4-2.5 MEQ/L IV SOLN
INTRAVENOUS | Status: DC
  Administered 2017-04-16 – 2017-04-17 (×8): via INTRAVENOUS_CENTRAL
  Filled 2017-04-16 (×29): qty 5000

## 2017-04-16 MED ORDER — EPINEPHRINE PF 1 MG/ML IJ SOLN
INTRAVENOUS | Status: DC | PRN
  Administered 2017-04-16: 3 ug/min via INTRAVENOUS

## 2017-04-16 MED ORDER — EPHEDRINE SULFATE-NACL 50-0.9 MG/10ML-% IV SOSY
PREFILLED_SYRINGE | INTRAVENOUS | Status: DC | PRN
  Administered 2017-04-16: 20 mg via INTRAVENOUS
  Administered 2017-04-16 (×2): 25 mg via INTRAVENOUS

## 2017-04-16 MED ORDER — METRONIDAZOLE IN NACL 5-0.79 MG/ML-% IV SOLN
500.0000 mg | Freq: Three times a day (TID) | INTRAVENOUS | Status: DC
  Administered 2017-04-16 – 2017-04-22 (×20): 500 mg via INTRAVENOUS
  Filled 2017-04-16 (×22): qty 100

## 2017-04-16 MED ORDER — SODIUM CHLORIDE 0.9 % IV SOLN
INTRAVENOUS | Status: DC | PRN
  Administered 2017-04-16 (×5): via INTRAVENOUS

## 2017-04-16 MED ORDER — FENTANYL CITRATE (PF) 100 MCG/2ML IJ SOLN
50.0000 ug | Freq: Once | INTRAMUSCULAR | Status: AC
  Administered 2017-04-16: 50 ug via INTRAVENOUS

## 2017-04-16 MED ORDER — HEPARIN SODIUM (PORCINE) 1000 UNIT/ML DIALYSIS
1000.0000 [IU] | INTRAMUSCULAR | Status: DC | PRN
  Administered 2017-04-17 (×2): 1400 [IU] via INTRAVENOUS_CENTRAL
  Administered 2017-04-18: 2800 [IU] via INTRAVENOUS_CENTRAL
  Filled 2017-04-16: qty 1
  Filled 2017-04-16: qty 5

## 2017-04-16 MED ORDER — HEPARIN SODIUM (PORCINE) 5000 UNIT/ML IJ SOLN
5000.0000 [IU] | Freq: Three times a day (TID) | INTRAMUSCULAR | Status: DC
  Administered 2017-04-17 – 2017-04-28 (×33): 5000 [IU] via SUBCUTANEOUS
  Filled 2017-04-16 (×34): qty 1

## 2017-04-16 MED ORDER — ONDANSETRON HCL 4 MG/2ML IJ SOLN
INTRAMUSCULAR | Status: AC
  Filled 2017-04-16: qty 2

## 2017-04-16 MED ORDER — HEPARIN SODIUM (PORCINE) 5000 UNIT/ML IJ SOLN
INTRAMUSCULAR | Status: DC | PRN
  Administered 2017-04-16: 500 mL

## 2017-04-16 MED ORDER — VANCOMYCIN HCL IN DEXTROSE 1-5 GM/200ML-% IV SOLN
1000.0000 mg | INTRAVENOUS | Status: AC
  Administered 2017-04-16: 1000 mg via INTRAVENOUS

## 2017-04-16 MED ORDER — CHLORHEXIDINE GLUCONATE 0.12% ORAL RINSE (MEDLINE KIT)
15.0000 mL | Freq: Two times a day (BID) | OROMUCOSAL | Status: DC
  Administered 2017-04-16 – 2017-04-17 (×2): 15 mL via OROMUCOSAL

## 2017-04-16 MED ORDER — MIDAZOLAM HCL 2 MG/2ML IJ SOLN
INTRAMUSCULAR | Status: AC
  Filled 2017-04-16: qty 2

## 2017-04-16 MED ORDER — ROCURONIUM BROMIDE 100 MG/10ML IV SOLN
INTRAVENOUS | Status: DC | PRN
  Administered 2017-04-16 (×2): 60 mg via INTRAVENOUS

## 2017-04-16 MED ORDER — 0.9 % SODIUM CHLORIDE (POUR BTL) OPTIME
TOPICAL | Status: DC | PRN
  Administered 2017-04-16: 1000 mL

## 2017-04-16 MED ORDER — PROPOFOL 10 MG/ML IV BOLUS
INTRAVENOUS | Status: DC | PRN
  Administered 2017-04-16: 150 mg via INTRAVENOUS

## 2017-04-16 MED ORDER — SODIUM CHLORIDE 0.9 % IV SOLN
100.0000 [IU] | INTRAVENOUS | Status: DC
  Administered 2017-04-16: 4.5 [IU]/h via INTRAVENOUS
  Filled 2017-04-16: qty 1

## 2017-04-16 MED ORDER — PANTOPRAZOLE SODIUM 40 MG IV SOLR
40.0000 mg | Freq: Every day | INTRAVENOUS | Status: DC
  Administered 2017-04-16: 40 mg via INTRAVENOUS
  Filled 2017-04-16: qty 40

## 2017-04-16 MED ORDER — NOREPINEPHRINE BITARTRATE 1 MG/ML IV SOLN
0.0000 ug/min | INTRAVENOUS | Status: DC
  Filled 2017-04-16: qty 16

## 2017-04-16 MED ORDER — LABETALOL HCL 5 MG/ML IV SOLN
10.0000 mg | INTRAVENOUS | Status: DC | PRN
  Administered 2017-04-16 (×2): 10 mg via INTRAVENOUS
  Filled 2017-04-16 (×2): qty 4

## 2017-04-16 MED ORDER — PROPOFOL 10 MG/ML IV BOLUS
INTRAVENOUS | Status: AC
  Filled 2017-04-16: qty 20

## 2017-04-16 MED ORDER — EPINEPHRINE PF 1 MG/ML IJ SOLN
0.5000 ug/min | INTRAVENOUS | Status: DC
  Filled 2017-04-16: qty 4

## 2017-04-16 MED ORDER — NOREPINEPHRINE BITARTRATE 1 MG/ML IV SOLN
0.0000 ug/min | Freq: Once | INTRAVENOUS | Status: DC
  Filled 2017-04-16: qty 4

## 2017-04-16 MED ORDER — SODIUM CHLORIDE 0.9 % FOR CRRT
INTRAVENOUS_CENTRAL | Status: DC | PRN
Start: 2017-04-16 — End: 2017-04-24
  Filled 2017-04-16: qty 1000

## 2017-04-16 MED ORDER — MORPHINE SULFATE (PF) 4 MG/ML IV SOLN
2.0000 mg | INTRAVENOUS | Status: DC | PRN
  Administered 2017-04-17 (×4): 2 mg via INTRAVENOUS
  Filled 2017-04-16 (×4): qty 1

## 2017-04-16 MED ORDER — HYDRALAZINE HCL 20 MG/ML IJ SOLN
10.0000 mg | INTRAMUSCULAR | Status: DC | PRN
  Administered 2017-04-17 – 2017-04-18 (×3): 20 mg via INTRAVENOUS
  Administered 2017-04-20: 40 mg via INTRAVENOUS
  Administered 2017-04-20 – 2017-04-26 (×3): 20 mg via INTRAVENOUS
  Administered 2017-04-27: 40 mg via INTRAVENOUS
  Administered 2017-04-27: 20 mg via INTRAVENOUS
  Filled 2017-04-16 (×2): qty 2
  Filled 2017-04-16 (×4): qty 1
  Filled 2017-04-16 (×2): qty 2
  Filled 2017-04-16 (×2): qty 1

## 2017-04-16 MED ORDER — INSULIN ASPART 100 UNIT/ML ~~LOC~~ SOLN
SUBCUTANEOUS | Status: DC | PRN
Start: 2017-04-16 — End: 2017-04-16
  Administered 2017-04-16: 2 [IU] via INTRAVENOUS

## 2017-04-16 MED ORDER — FENTANYL BOLUS VIA INFUSION
50.0000 ug | INTRAVENOUS | Status: DC | PRN
  Administered 2017-04-16 – 2017-04-17 (×4): 50 ug via INTRAVENOUS
  Filled 2017-04-16: qty 50

## 2017-04-16 MED ORDER — VASOPRESSIN 20 UNIT/ML IV SOLN
INTRAVENOUS | Status: DC | PRN
  Administered 2017-04-16 (×6): 2 [IU] via INTRAVENOUS

## 2017-04-16 MED ORDER — HEPARIN SODIUM (PORCINE) 1000 UNIT/ML IJ SOLN
INTRAMUSCULAR | Status: AC
  Filled 2017-04-16: qty 1

## 2017-04-16 MED ORDER — DEXMEDETOMIDINE HCL IN NACL 400 MCG/100ML IV SOLN
0.0000 ug/kg/h | INTRAVENOUS | Status: DC
  Administered 2017-04-16: 0.5 ug/kg/h via INTRAVENOUS
  Filled 2017-04-16: qty 100

## 2017-04-16 MED ORDER — FENTANYL CITRATE (PF) 250 MCG/5ML IJ SOLN
INTRAMUSCULAR | Status: AC
  Filled 2017-04-16: qty 5

## 2017-04-16 MED ORDER — IOPAMIDOL (ISOVUE-300) INJECTION 61%
INTRAVENOUS | Status: AC
  Filled 2017-04-16: qty 30

## 2017-04-16 MED ORDER — EPINEPHRINE PF 1 MG/10ML IJ SOSY
PREFILLED_SYRINGE | INTRAMUSCULAR | Status: DC | PRN
  Administered 2017-04-16 (×4): 0.5 mg via INTRAVENOUS
  Administered 2017-04-16: 0.3 mg via INTRAVENOUS
  Administered 2017-04-16 (×10): 0.5 mg via INTRAVENOUS

## 2017-04-16 SURGICAL SUPPLY — 92 items
ADH SKN CLS APL DERMABOND .7 (GAUZE/BANDAGES/DRESSINGS) ×4
ADH SKN CLS LQ APL DERMABOND (GAUZE/BANDAGES/DRESSINGS) ×4
APL SKNCLS STERI-STRIP NONHPOA (GAUZE/BANDAGES/DRESSINGS) ×4
BENZOIN TINCTURE PRP APPL 2/3 (GAUZE/BANDAGES/DRESSINGS) ×6 IMPLANT
BLADE SAW SGTL HD 18.5X60.5X1. (BLADE) ×6 IMPLANT
BLADE SURG 21 STRL SS (BLADE) ×6 IMPLANT
BNDG COHESIVE 4X5 TAN STRL (GAUZE/BANDAGES/DRESSINGS) IMPLANT
BNDG GAUZE ELAST 4 BULKY (GAUZE/BANDAGES/DRESSINGS) IMPLANT
CANISTER SUCT 3000ML PPV (MISCELLANEOUS) ×6 IMPLANT
CATH ANGIO 5F BER 65CM (CATHETERS) ×3 IMPLANT
CATH ANGIO 5F BER2 100CM (CATHETERS) ×3 IMPLANT
CATH EMB 4FR 80CM (CATHETERS) ×6 IMPLANT
CATH HEADHUNTER H1 5F 100CM (CATHETERS) ×3 IMPLANT
CATH INFINITI 6F MPB2 (CATHETERS) ×3 IMPLANT
CATH QUICKCROSS SUPP .035X90CM (MICROCATHETER) ×3 IMPLANT
CATH TRIALYSIS 20CM 13F 3LUM (CATHETERS) ×3 IMPLANT
CLIP VESOCCLUDE MED 24/CT (CLIP) ×6 IMPLANT
CLIP VESOCCLUDE SM WIDE 24/CT (CLIP) ×6 IMPLANT
COVER BACK TABLE 60X90IN (DRAPES) ×3 IMPLANT
COVER PROBE W GEL 5X96 (DRAPES) ×3 IMPLANT
COVER SURGICAL LIGHT HANDLE (MISCELLANEOUS) ×6 IMPLANT
DERMABOND ADHESIVE PROPEN (GAUZE/BANDAGES/DRESSINGS) ×2
DERMABOND ADVANCED (GAUZE/BANDAGES/DRESSINGS) ×2
DERMABOND ADVANCED .7 DNX12 (GAUZE/BANDAGES/DRESSINGS) ×4 IMPLANT
DERMABOND ADVANCED .7 DNX6 (GAUZE/BANDAGES/DRESSINGS) ×1 IMPLANT
DEVICE EMBOSHIELD NAV6 4.0-7.0 (WIRE) ×3 IMPLANT
DEVICE TORQUE H2O (MISCELLANEOUS) ×3 IMPLANT
DEVICE TORQUE KENDALL .025-038 (MISCELLANEOUS) ×3 IMPLANT
DRAIN CHANNEL 15F RND FF W/TCR (WOUND CARE) IMPLANT
DRAPE INCISE IOBAN 66X45 STRL (DRAPES) ×6 IMPLANT
DRAPE U-SHAPE 47X51 STRL (DRAPES) ×6 IMPLANT
DRAPE X-RAY CASS 24X20 (DRAPES) IMPLANT
DRSG ADAPTIC 3X8 NADH LF (GAUZE/BANDAGES/DRESSINGS) IMPLANT
DRSG PAD ABDOMINAL 8X10 ST (GAUZE/BANDAGES/DRESSINGS) IMPLANT
DURAPREP 26ML APPLICATOR (WOUND CARE) ×6 IMPLANT
ELECT REM PT RETURN 9FT ADLT (ELECTROSURGICAL) ×12
ELECTRODE REM PT RTRN 9FT ADLT (ELECTROSURGICAL) ×8 IMPLANT
EVACUATOR SILICONE 100CC (DRAIN) ×3 IMPLANT
GAUZE SPONGE 4X4 12PLY STRL (GAUZE/BANDAGES/DRESSINGS) IMPLANT
GLOVE BIOGEL PI IND STRL 7.5 (GLOVE) ×4 IMPLANT
GLOVE BIOGEL PI IND STRL 9 (GLOVE) ×4 IMPLANT
GLOVE BIOGEL PI INDICATOR 7.5 (GLOVE) ×2
GLOVE BIOGEL PI INDICATOR 9 (GLOVE) ×2
GLOVE SURG ORTHO 9.0 STRL STRW (GLOVE) ×6 IMPLANT
GLOVE SURG SS PI 7.5 STRL IVOR (GLOVE) ×6 IMPLANT
GOWN STRL REUS W/ TWL LRG LVL3 (GOWN DISPOSABLE) ×8 IMPLANT
GOWN STRL REUS W/ TWL XL LVL3 (GOWN DISPOSABLE) ×16 IMPLANT
GOWN STRL REUS W/TWL LRG LVL3 (GOWN DISPOSABLE) ×12
GOWN STRL REUS W/TWL XL LVL3 (GOWN DISPOSABLE) ×24
GUIDEWIRE ANGLED .035X150CM (WIRE) ×3 IMPLANT
GUIDEWIRE ANGLED .035X260CM (WIRE) ×3 IMPLANT
HEMOSTAT SNOW SURGICEL 2X4 (HEMOSTASIS) ×3 IMPLANT
KIT BASIN OR (CUSTOM PROCEDURE TRAY) ×12 IMPLANT
KIT ROOM TURNOVER OR (KITS) ×12 IMPLANT
NDL PERC 18GX7CM (NEEDLE) IMPLANT
NEEDLE PERC 18GX7CM (NEEDLE) ×12 IMPLANT
NS IRRIG 1000ML POUR BTL (IV SOLUTION) ×18 IMPLANT
PACK ORTHO EXTREMITY (CUSTOM PROCEDURE TRAY) ×6 IMPLANT
PACK PERIPHERAL VASCULAR (CUSTOM PROCEDURE TRAY) ×6 IMPLANT
PAD ARMBOARD 7.5X6 YLW CONV (MISCELLANEOUS) ×24 IMPLANT
PATCH VASC XENOSURE 1CMX6CM (Vascular Products) ×6 IMPLANT
PATCH VASC XENOSURE 1X6 (Vascular Products) ×1 IMPLANT
SET COLLECT BLD 21X3/4 12 (NEEDLE) IMPLANT
SET MICROPUNCTURE 5F STIFF (MISCELLANEOUS) ×3 IMPLANT
SHEATH BRITE TIP 7FRX11 (SHEATH) ×6 IMPLANT
SHIELD RADPAD SCOOP 12X17 (MISCELLANEOUS) ×6 IMPLANT
SPONGE INTESTINAL PEANUT (DISPOSABLE) ×3 IMPLANT
SPONGE LAP 18X18 X RAY DECT (DISPOSABLE) IMPLANT
STOPCOCK 4 WAY LG BORE MALE ST (IV SETS) ×3 IMPLANT
SUT ETHILON 2 0 PSLX (SUTURE) ×12 IMPLANT
SUT ETHILON 3 0 PS 1 (SUTURE) ×3 IMPLANT
SUT PROLENE 5 0 C 1 24 (SUTURE) ×9 IMPLANT
SUT PROLENE 6 0 BV (SUTURE) ×6 IMPLANT
SUT PROLENE 7 0 BV1 MDA (SUTURE) ×3 IMPLANT
SUT VIC AB 2-0 CT1 27 (SUTURE) ×6
SUT VIC AB 2-0 CT1 TAPERPNT 27 (SUTURE) ×4 IMPLANT
SUT VIC AB 2-0 CTB1 (SUTURE) IMPLANT
SUT VIC AB 3-0 SH 27 (SUTURE) ×6
SUT VIC AB 3-0 SH 27X BRD (SUTURE) ×4 IMPLANT
SUT VICRYL 4-0 PS2 18IN ABS (SUTURE) ×6 IMPLANT
SYR 10ML LL (SYRINGE) ×9 IMPLANT
SYR 3ML LL SCALE MARK (SYRINGE) ×3 IMPLANT
TOWEL OR 17X24 6PK STRL BLUE (TOWEL DISPOSABLE) ×6 IMPLANT
TOWEL OR 17X26 10 PK STRL BLUE (TOWEL DISPOSABLE) ×6 IMPLANT
TUBE CONNECTING 12'X1/4 (SUCTIONS) ×1
TUBE CONNECTING 12X1/4 (SUCTIONS) ×5 IMPLANT
TUBING EXTENTION W/L.L. (IV SETS) IMPLANT
UNDERPAD 30X30 (UNDERPADS AND DIAPERS) ×6 IMPLANT
WATER STERILE IRR 1000ML POUR (IV SOLUTION) ×12 IMPLANT
WIRE BAREWIRE WORK .014X315CM (WIRE) ×3 IMPLANT
WIRE BENTSON .035X145CM (WIRE) ×6 IMPLANT
YANKAUER SUCT BULB TIP NO VENT (SUCTIONS) ×6 IMPLANT

## 2017-04-16 NOTE — Transfer of Care (Signed)
Immediate Anesthesia Transfer of Care Note  Patient: [name]  Procedure(s) Performed: RIGHT FEMORAL ENDARTERECTOMY (Right Groin) right LOWER EXTREMITY ANGIOGRAM (Right Groin) PATCH ANGIOPLASTY OF RIGHT FEMORAL ARTERY USING BOVINE PERICARDIUM PATCH (Right Groin) INSERTION of temporay DIALYSIS CATHETER, left femoral artery (Left Groin) ANGIOGRAM PULMONARY ([name] Chest)  Patient Location: SICU  Anesthesia Type:General  Level of Consciousness: drowsy, patient cooperative and Patient remains intubated per anesthesia plan  Airway & Oxygen Therapy:   Post-op Assessment: Report given to RN, Post -op Vital signs reviewed and stable and Patient moving all extremities X 4  Post vital signs: Reviewed and stable  Last Vitals:  Vitals:   04/16/17 0640  BP: (!) 196/72  Pulse: 74  Resp: 18  Temp: 36.5 C  SpO2: 96%    Last Pain: There were no vitals filed for this visit.       Complications: respiratory complications, cardiovascular complications and Pt had adverse event intra-op and was coded for over 2 hrs, stabilized and transported to ICU

## 2017-04-16 NOTE — Op Note (Signed)
Patient name: [name] MRN: [id number] DOB: [birth year] Sex: female  04/16/2017 Pre-operative Diagnosis: Right lower extremity wound Post-operative diagnosis:  Same Surgeon:  [name] Assistants:  Arlee Muslim Procedure:   #1: Right common femoral, profundus femoral, superficial femoral, and external iliac endarterectomy with bovine pericardial patch angioplasty   #2: Right lower extremity arteriogram   #3 catheter in right popliteal artery from the right common femoral artery   #4: Placement of left femoral central line and dialysis catheter   Anesthesia: General Blood Loss: 300 cc Specimens:    Findings: The patient had nearly occlusive cauliflower-like plaque within the distal external iliac, common femoral and proximal superficial femoral artery.  This was removed and bovine pericardial patch angioplasty was performed.  Then through the patch I proceeded to cross the lesions within the superficial femoral and popliteal artery.  At this time once I was across the lesion, the patient became hemodynamically unstable.  Once it was clear that she was not going to bounce back from her hemodynamic issues, I elected to abort the procedure.  I closed the groin by reapproximating the tissue with 2 layers of 2-0 Vicryl in 2 layers of 3-0 Vicryl followed by Dermabond.  A 15 Blake drain was placed.  The patient did require multiple rounds of CPR.  Transesophageal echo, bronchoscopy, and pulmonary angiography were performed which were all unremarkable.  She did ultimately stabilize and was able to be transferred to the ICU.  Indications: The patient has had difficulty with wounds on her right foot.  She has undergone angiography which identified a right common femoral lesion as well as lesions within her superficial femoral and popliteal artery on the right.  I was unable to cross her aortic bifurcation because of the calcific disease and so she is here today for operative  intervention.  Procedure:  The patient was identified in the holding area and taken to [place] [facility]  The patient was then placed supine on the table. general anesthesia was administered.  The patient was prepped and draped in the usual sterile fashion.  A time out was called and antibiotics were administered.  A longitudinal incision was made in the right groin.  Cautery was used about subtenons tissue down to the femoral sheath.  She had branches of her femoral nerve which crossed over the femoral bifurcation which had to be mobilized and protected.  I dissected out the common femoral artery as well as the proximal profunda and superficial femoral artery.  Because of the crossing nerves, I had difficulty with full exposure of the proximal profunda femoral artery and superficial femoral artery, however I felt I had adequate exposure.  Once this was done, the patient was fully heparinized.  After the heparin circulated the artery was occluded with vascular clamps and #11 blade was used to make an arteriotomy which was extended longitudinally with Potts scissors.  I opened the arteriotomy approximately 1 cm down onto the superficial femoral artery.  The patient had nearly occlusive cauliflower type plaque within the common femoral artery.  An extensive endarterectomy was performed which extended up into the external iliac artery.  In order to endarterectomized the external iliac artery, I used a Fogarty catheter for proximal control.  I ultimately ended up tacking down the plaque in the superficial femoral artery, as there was no definitive endpoint.  Once I was satisfied with the endarterectomy, a bovine pericardial patch was selected and patch angioplasty was performed using a running 5-0 Prolene.  Prior to completion the appropriate flushing maneuvers were performed and the anastomosis was completed.  There was an excellent pulse within the common femoral artery.  Next attention was turned for the endovascular  portion of the procedure.  An 18-gauge needle was used to cannulate the bovine pericardial patch.  Wire was navigated into the superficial femoral artery and a 7 French sheath was placed.  I used a Berenstein 2 catheter and a Glidewire to successfully cross the occlusions in the superficial femoral and popliteal artery.  A contrast injection was performed, confirming successful crossing of the lesion.  At this point in time the patient essentially had circulatory collapse.  When she did not respond to conservative measures, I felt proceeding with the remaining portion of the case was not warranted.  I remove the catheter wire and closed the arteriotomy in the patch where the sheath was with 5-0 Prolene.  The wound was irrigated and I obtained hemostasis.  I placed a 15 Blake drain which was secured with 3-0 nylon.  I then reapproximated the femoral sheath with 2-0 Vicryl.  The remaining subcutaneous tissue was closed with additional layers of Vicryl followed by 4-0 Vicryl and the skin and Dermabond.  The patient underwent multiple rounds of chest compressions.  A TEE probe was brought in.  No obvious abnormality such as hypokinesis or dissection was identified.  Several cardiologist came and to evaluate this.  Bronchoscopy was also performed because the patient had decreased oxygen saturation.  I also asked radiology to come in for pulmonary angiography.  We did give the patient 15,000 units of extra heparin when we felt a PE was a possibility.  All of her diagnostic studies were unremarkable.  After several hours, the patient became more hemodynamically stable and we were able to get her to the intensive care unit where she was taken in guarded condition.  I had multiple conversations with the family updating them on the patient's progress as well as the gravity of the situation.     Disposition: To ICU in critical condition   V. [name], M.D. [facility] Office:  [phone] Pager:  [phone]

## 2017-04-16 NOTE — H&P (Signed)
Hospital Consult  Reason for Consult: Non healing wound bilateral toes  Requesting Physician: Carolin Sicks  MRN #: [id number]  History of Present Illness: This is a 41 y.o. female who states that she had a blood blister presenton the 4th toe on both feet. On Saturday evening, her dog, Rambo and 50lb bloodhound mix stepped in her right foot and tore the skin off of the 4th toe. She states that she developed pain in her right foot that ascended up her leg the next day. She states that she has had wounds in the past, but no trouble with them healing. She does have neuropathy of her feet. She does have scabs present as well. She presented to the ED yesterday with increasing redness, leg pain and swelling. She did have a mild leukocytosis, but was afebrile.  Dr. Sharol Given was consulted and recommended a vascular consult. She was started on a NTG patch and Trental to improve the microcirculation in her foot. He is uncertain if she can heal a BKA. Pt was also seen by infectious disease and was placed on broad spectrum abx. MRI is pending.  She is ESRD and dialyzes M/W/F at the [facility] location in [place]. She dialyzes via a left RC AVF. She states it has had revisions in the past. It was originally created at [facility]. Her K+ today is 6.1 and will go to HD tomorrow per nephrology.  She does have thrombocytopenia with a platelet count on admission of 121k and is 94k today.  The pt is on a statin for cholesterol management. She is on a CCB, beta blocker and catapres for blood pressure management. 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 She denies hx of MI, chest pain, CVA. She states that she had an ultrasound of her neck a couple of years ago. She quit smoking 2.5 years ago.      Past Medical History:  Diagnosis Date  . Chronic anemia    2nd to renal disease  . CIN III (cervical intraepithelial neoplasia grade III) with severe dysplasia    S/P LEEP AND CONE  . Coronary artery disease    Status post cardiac catheterization June 2012  scattered coronary artery disease/atherosclerosis with 70-80% stenosis in a small right PDA.  . 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 DM (diabetes mellitus) ([place])    Long-term insulin  . End stage renal disease on dialysis ([place]) 05/02/11   [facility]; M; W, F; last time 05/01/11  . Fracture of 5th metatarsal 2016   Right  . Gastroparesis   . 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 History of abscesses in groins 12/06/2010  . Hyperlipidemia    Hypertriglyceridemia 449 HDL 25  . Hypertension   . Migraines    "just on dialysis days"  . Peripheral neuropathy    related to DM  . Peripheral vascular disease ([place])    Tibial occlusive disease evaluated by Dr. Kellie Simmering in August 2011. Medical therapy  . Renal insufficiency    Dialysis since 2012  . Tobacco use disorder    Discontinued March 2012  . Trimalleolar fracture of ankle, closed 02/09/2015   Right        Past Surgical History:  Procedure Laterality Date  . AV FISTULA PLACEMENT  04/2010  . CARDIAC CATHETERIZATION [name] 02/10/2015   Procedure: Left Heart Cath and Coronary Angiography; Surgeon: Dixie Dials, MD; Location: [place] [facility]; Service: Cardiovascular; Laterality: [name];  . CARDIAC CATHETERIZATION [name] 02/21/2016   Procedure: Right Heart Cath; Surgeon: Shaune Pascal Bensimhon,  MD; Location: [place] [facility]; Service: Cardiovascular; Laterality: [name];  . CERVICAL BIOPSY W/ LOOP ELECTRODE EXCISION     h/o  . CERVICAL CONE BIOPSY     h/o  . COLPOSCOPY    . DILATION AND CURETTAGE OF UTERUS  2009  . ORIF ANKLE FRACTURE Right 02/09/2015  . ORIF ANKLE FRACTURE Right 02/16/2015   Procedure: OPEN REDUCTION INTERNAL FIXATION (ORIF) RIGHT ANKLE FRACTURE; Surgeon: Altamese Golden Grove, MD; Location: [facility]; Service: Orthopedics; Laterality: Right;        Allergies  Allergen Reactions  . Cephalexin Other (See Comments)    Reaction unknown- Childhood allergy  Tolerated Ceftriaxone in the past  . Sulfamethoxazole-Trimethoprim Other (See Comments)    Unknown  reaction. Pt states that she was told by her mother that she had allergy to Bactrim as a child.          Prior to Admission medications   Medication Sig Start Date End Date Taking? Authorizing Provider  amLODipine (NORVASC) 10 MG tablet Take 1 tablet (10 mg total) by mouth daily. 02/24/16  Yes Tat, Shanon Brow, MD  amoxicillin-clavulanate (AUGMENTIN) 500-125 MG tablet Take 1 tablet (500 mg total) by mouth daily. X 7 days, then on Mon-Wed-Fri only after dialysis 02/24/16  Yes Tat, David, MD  atorvastatin (LIPITOR) 40 MG tablet Take 40 mg by mouth daily.   Yes [provider]  cloNIDine (CATAPRES) 0.3 MG tablet Take 1 tablet (0.3 mg total) by mouth 3 (three) times daily. 11/01/14  Yes Debbe Odea, MD  cyclobenzaprine (FLEXERIL) 10 MG tablet Take 1 tablet (10 mg total) by mouth at bedtime.  Patient taking differently: Take 10 mg by mouth at bedtime as needed for muscle spasms.  03/12/17  Yes Magnus Sinning, MD  Ferric Citrate (AURYXIA PO) Take 2 tablets by mouth 3 (three) times daily with meals.   Yes [provider]  HUMALOG MIX 50/50 KWIKPEN (50-50) 100 UNIT/ML Kwikpen Inject 26 Units into the skin 2 (two) times daily.  04/29/14  Yes [provider]  hydrALAZINE (APRESOLINE) 100 MG tablet Take 1 tablet (100 mg total) by mouth every 8 (eight) hours. 02/24/16  Yes Tat, Shanon Brow, MD  HYDROcodone-acetaminophen (NORCO) 5-325 MG tablet Take 1 tablet by mouth every 6 (six) hours as needed for moderate pain. 02/02/17  Yes Kirichenko, Tatyana, PA-C  insulin lispro (HUMALOG) 100 UNIT/ML injection Inject 6 Units into the skin 3 (three) times daily before meals.   Yes [provider]  isosorbide mononitrate (IMDUR) 120 MG 24 hr tablet Take 1 tablet (120 mg total) by mouth daily. 02/25/16  Yes Tat, Shanon Brow, MD  labetalol (NORMODYNE) 300 MG tablet Take 1 tablet (300 mg total) by mouth 3 (three) times daily. 11/01/14  Yes Debbe Odea, MD  RENVELA 800 MG tablet Take 4,800 mg by mouth 3 (three)  times daily with meals.  04/17/13  Yes [provider]  traMADol (ULTRAM) 50 MG tablet Take 1 tablet (50 mg total) by mouth every 6 (six) hours as needed for moderate pain (or Headache unrelieved by tylenol). 02/24/16  Deveron Furlong, MD   Social History        Social History  . Marital status: Married    Spouse name: [name]  . Number of children: [name]  . Years of education: [name]       Occupational History  .  [employer]         Social History Main Topics  . Smoking status: Former Smoker    Packs/day: 0.30  Years: 10.00    Types: Cigarettes    Quit date: 08/05/2011  . Smokeless tobacco: Never Used     Comment: "quit smoking cigarettes 07/2010"  . Alcohol use No  . Drug use: No  . Sexual activity: Not on file       Other Topics Concern  . Not on file      Social History Narrative  . No narrative on file        Family History  Problem Relation Age of Onset  . Diabetes Mother   . Hypertension Mother   . Diabetes Father   . Hyperlipidemia Father   . Hypertension Father   . Diabetes Sister   . Stroke Maternal Grandmother   . Diabetes Sister   . Breast cancer Maternal Aunt    Age 36's   ROS: [x]  Positive [ ]  Negative [ ]  All sytems reviewed and are negative  Cardiac:  []  chest pain/pressure  []  palpitations  []  SOB lying flat  []  DOE  Vascular:  []  pain in legs while walking  []  pain in legs at rest  []  pain in legs at night  []  non-healing ulcers  []  hx of DVT  []  swelling in legs  Pulmonary:  []  productive cough  []  asthma/wheezing  []  home O2  Neurologic:  []  weakness in []  arms []  legs  []  numbness in []  arms []  legs  []  hx of CVA []  mini stroke  [] difficulty speaking or slurred speech  []  temporary loss of vision in one eye  []  dizziness  [x]  hx migraines  Hematologic:  []  hx of cancer  []  bleeding problems  []  problems with blood clotting easily  [x]  anemia of chronic disease  [x]  thrombocytopenia  Endocrine:  [x]  diabetes []  thyroid  disease  GI  []  vomiting blood  []  blood in stool  GU:  [x]  CKD/renal failure [x]  HD--[x]  M/W/F or []  T/T/S  []  burning with urination  []  blood in urine  Psychiatric:  []  anxiety  []  depression  Musculoskeletal:  []  arthritis  []  joint pain  Integumentary:  []  rashes []  ulcers  Constitutional:  []  fever []  chills  Physical Examination      Vitals:   03/25/17 1101 03/25/17 1148  BP: (!) 183/78 (!) 201/90  Pulse: 70 70  Resp: 16 17  Temp: (!) 97.4 F (36.3 C) 98.2 F (36.8 C)  SpO2: 94% 95%   Body mass index is 26.14 kg/m.  General: WDWN in NAD  Gait: Not observed  HENT: WNL, normocephalic  Pulmonary: normal non-labored breathing, without Rales, rhonchi, wheezing  Cardiac: regular, without Murmurs, rubs or gallops; with left carotid bruit  Abdomen: soft, NT/ND, no masses  Skin: without rashes  Vascular Exam/Pulses:   Right Left  Radial 2+ (normal) 2+ (normal)  Femoral 2+ (normal) 2+ (normal)  Popliteal Unable to palpate  Unable to palpate   DP Unable to palpate  Unable to palpate   PT Unable to palpate  Unable to palpate   Extremities: left foot warmer than right;  Right foot.   Left foot   Musculoskeletal: no muscle wasting or atrophy  Neurologic: A&O X 3; No focal weakness or paresthesias are detected; speech is fluent/normal  Psychiatric: The pt has Normal affect.  Lymph: No inguinal lymphadenopathy  CBC  Labs (Brief)  BMET  Labs (Brief)                                                                             COAGS:  Recent Labs                                   Non-Invasive Vascular Imaging:  ABI'S 03/24/17:  Final Interpretation: Right: Resting right ankle-brachial index indicates noncompressible right lower extremity arteries.The right toe-brachial index is abnormal. Left: Resting left ankle-brachial index indicates  noncompressible right lower extremity arteries.The left toe-brachial index is normal.  Statin: Yes.  Beta Blocker: Yes.  Aspirin: No.  ACEI: No.  ARB: No.  CCB use: Yes  Other antiplatelets/anticoagulants: Yes. SQ heparin  ASSESSMENT/PLAN: This is a 41 y.o. female with ESRD and IDDM with non healing wounds bilateral feet.  -pt ABI's reveal non compressible vessels bilaterally. She does have non healing wounds bilateral feet (see above pictures) the left foot is warmer than the right. I cannot palpate pulses and there is no doppler available. She will need arteriogram to evaluate arterial anatomy with possible intervention. Of note, she does have thrombocytopenia with platelet count today of 94k.  -antibiotics per infectious disease  -MRI pending  -she does have a right carotid bruit-will order carotid duplex.  -Dr. Trula Slade to see pt this afternoon.  [name], PA-C  [facility]  [phone]     Patient has undergone angiography recently which shows a calcified common femoral artery stenosis as well as occlusive disease in her adductor canal on the right.  We discussed proceeding with right femoral endarterectomy and atherectomy of her superficial femoral artery with subsequent angioplasty versus stenting.  Simultaneously, Dr. Sharol Given to will perform transmetatarsal amputation.  Risks and benefits of the procedure were discussed with the patient and she wishes to proceed  [name]

## 2017-04-16 NOTE — H&P (Signed)
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  The patient was profoundly acidotic and once that was addressed patient had a stable rhythm and was brought to the ICU for CRRT to address profound metabolic acidosis.  Patient was unresponsive upon arrival but with stabilization of BP reportedly became responsive.  [name] was consulted for vent and vital signs management.  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  PAST SURGICAL HISTORY: She  has a past surgical history that includes AV fistula placement (04/2010); Dilation and curettage of uterus (2009); Cervical biopsy w/ loop electrode excision; Cervical cone biopsy; Colposcopy; Cardiac catheterization ([name], 02/10/2015); ORIF ankle fracture (Right, 02/09/2015); ORIF ankle fracture (Right, 02/16/2015); Cardiac catheterization ([name], 02/21/2016); and ABDOMINAL AORTOGRAM W/LOWER EXTREMITY ([name], 03/27/2017).  Allergies  Allergen Reactions   . Cephalexin Other (See Comments)    Reaction unknown- Childhood allergy Tolerated Ceftriaxone in the past  . Sulfamethoxazole-Trimethoprim Other (See Comments)    Unknown reaction. Pt states that she was told by her mother that she had allergy to Bactrim as a child.    No current facility-administered medications on file prior to encounter.    Current Outpatient Medications on File Prior to Encounter  Medication Sig  . amLODipine (NORVASC) 10 MG tablet Take 1 tablet (10 mg total) by mouth daily. (Patient taking differently: Take 10 mg 2 (two) times daily by mouth. )  . amoxicillin-clavulanate (AUGMENTIN) 500-125 MG tablet Take 1 tablet See admin instructions by mouth. Three times a week after dialysis  . aspirin EC 81 MG EC tablet Take 1 tablet (81 mg total) by mouth daily.  Marland Kitchen atorvastatin (LIPITOR) 40 MG tablet Take 40 mg by mouth daily.  . cinacalcet (SENSIPAR) 30 MG tablet Take 2 tablets (60 mg total) by mouth daily with supper. (Patient taking differently: Take 30 mg daily with supper by mouth. )  . cloNIDine (CATAPRES) 0.3 MG tablet Take 1 tablet (0.3 mg total) by mouth 3 (three) times daily.  . cyclobenzaprine (FLEXERIL) 10 MG tablet Take 1 tablet (10 mg total) by mouth at bedtime. (Patient taking differently: Take 10 mg by mouth at bedtime as needed for muscle spasms. )  . ferric citrate (AURYXIA) 1 GM 210 MG(Fe) tablet Take 3 tablets 3 (three) times daily with meals by mouth.   Marland Kitchen HUMALOG MIX 50/50 KWIKPEN (50-50) 100 UNIT/ML Kwikpen Inject 26 Units into the skin 2 (two) times daily.   . hydrALAZINE (APRESOLINE) 100 MG tablet Take 1 tablet (100 mg total) by mouth every 8 (eight) hours.  Marland Kitchen HYDROcodone-acetaminophen (NORCO) 5-325 MG tablet Take 1 tablet by mouth every 6 (six) hours as needed  for moderate pain.  Marland Kitchen insulin lispro (HUMALOG) 100 UNIT/ML injection Inject 6 Units into the skin 3 (three) times daily before meals.  . isosorbide mononitrate (IMDUR) 120 MG 24 hr tablet Take 1  tablet (120 mg total) by mouth daily.  Marland Kitchen ketorolac (ACULAR) 0.5 % ophthalmic solution Place 1 drop 4 (four) times daily into the right eye.  . labetalol (NORMODYNE) 300 MG tablet Take 1 tablet (300 mg total) by mouth 3 (three) times daily.  . methyldopa (ALDOMET) 500 MG tablet Take 500 mg daily by mouth.  . multivitamin (RENA-VIT) TABS tablet Take 1 tablet by mouth at bedtime.  . prednisoLONE acetate (PRED FORTE) 1 % ophthalmic suspension Place 1 drop 4 (four) times daily into the right eye.  . promethazine (PHENERGAN) 12.5 MG tablet Take 12.5 mg every 6 (six) hours as needed by mouth for nausea or vomiting.   Marland Kitchen RENVELA 800 MG tablet Take 4,800 mg by mouth 3 (three) times daily with meals.   Marland Kitchen spironolactone (ALDACTONE) 100 MG tablet Take 100 mg daily by mouth.  . tobramycin-dexamethasone (TOBRADEX) ophthalmic ointment Place 1 application 4 (four) times daily into the right eye.  . traMADol (ULTRAM) 50 MG tablet Take 1 tablet (50 mg total) by mouth every 6 (six) hours as needed for moderate pain (or Headache unrelieved by tylenol).  . pentoxifylline (TRENTAL) 400 MG CR tablet Take 1 tablet (400 mg total) by mouth 3 (three) times daily with meals. (Patient not taking: Reported on 04/10/2017)    FAMILY HISTORY:  Her indicated that her mother is deceased. She indicated that her father is alive. She indicated that both of her sisters are alive. She indicated that her maternal grandmother is deceased. She indicated that her maternal grandfather is deceased. She indicated that her paternal grandmother is deceased. She indicated that her paternal grandfather is deceased. She indicated that her son is alive. She indicated that the status of her maternal aunt is unknown.   SOCIAL HISTORY: She  reports that she quit smoking about 5 years ago. Her smoking use included cigarettes. She has a 3.00 pack-year smoking history. she has never used smokeless tobacco. She reports that she does not drink alcohol or use  drugs.  REVIEW OF SYSTEMS:   Unattainable  SUBJECTIVE:  Unresponsive  VITAL SIGNS: BP (!) 196/72   Pulse 74   Temp 97.7 F (36.5 C)   Resp 18   LMP 10/09/2015 Comment: pt on dialysis  SpO2 96%   HEMODYNAMICS:    VENTILATOR SETTINGS:    INTAKE / OUTPUT: No intake/output data recorded.  PHYSICAL EXAMINATION: General:  Chronically ill appearing female, arousable Neuro:  Arouses easily, moving all ext to command HEENT:  [place]/AT, PERRL, EOM-I and MMM Cardiovascular:  RRR, Nl S1/S2, -M/R/G. Lungs:  Diffuse crackles Abdomen:  Soft, NT, ND and +BS Musculoskeletal:  Ischemic appearing right leg post surgical with some toe necrosis Skin:  As above, otherwise intact  LABS:  BMET Recent Labs  Lab 04/15/17 1356  04/16/17 0956 04/16/17 1036 04/16/17 1110 04/16/17 1140 04/16/17 1219 04/16/17 1310  NA 138   < > 136 136 136 137 138 139  K 3.6   < > 3.9 3.8 3.8 4.8 4.5 5.5*  CL 94*  --   --   --   --   --   --   --   CO2 31  --   --   --   --   --   --   --  BUN 20  --   --   --   --   --   --   --   CREATININE 3.61*  --   --   --   --   --   --   --   GLUCOSE 266*   < > 284* 248* 200*  --   --   --    < > = values in this interval not displayed.    Electrolytes Recent Labs  Lab 04/15/17 1356  CALCIUM 9.1    CBC Recent Labs  Lab 04/15/17 1356  04/16/17 1140 04/16/17 1219 04/16/17 1310  WBC 10.6*  --   --   --   --   HGB 10.1*   < > 9.2* 10.9* 9.9*  HCT 32.4*   < > 27.0* 32.0* 29.0*  PLT 221  --   --   --   --    < > = values in this interval not displayed.    Coag's Recent Labs  Lab 04/15/17 1356  APTT 39*  INR 1.14    Sepsis Markers No results for input(s): LATICACIDVEN, PROCALCITON, O2SATVEN in the last 168 hours.  ABG Recent Labs  Lab 04/16/17 1140 04/16/17 1219 04/16/17 1310  PHART 7.335* 7.080* 7.111*  PCO2ART 42.2 61.9* 69.3*  PO2ART 47.0* 55.0* 42.0*    Liver Enzymes Recent Labs  Lab 04/15/17 1356  AST 20  ALT 15  ALKPHOS  449*  BILITOT 0.7  ALBUMIN 2.8*    Cardiac Enzymes No results for input(s): TROPONINI, PROBNP in the last 168 hours.  Glucose Recent Labs  Lab 04/15/17 1345 04/16/17 0742  GLUCAP 276* 314*    Imaging No results found.   STUDIES:  Abdominal CT 11/21>>> Head CT 11/21>>>  CULTURES: Blood 11/21>>>  ANTIBIOTICS: Levaquin 11/21>>> Flagyl 11/21>>>  SIGNIFICANT EVENTS:  11/21>>> Cardiac arrest in OR  LINES/TUBES: ETT 11/21>>> L femoral HD catheter 11/21>>> R radial a-line 11/21>>>  DISCUSSION: 41 year old type I diabetic with all the classic complications with cardiac arrest in the OR for 2 hours that is now following commands so no need for cooling.  Rapidly coming off pressors with addressing of acidosis.  Cause of arrest I suspect is acidosis after reperfusion in a patient with no renal failure (ESRD) vs ischemic bowel.  ASSESSMENT / PLAN:  PULMONARY A: ARDS and VDRF post CPR for 2 hours P:   - Full vent support - Increase PEEP to 12 - F/U ABG now and adjust vent - CXR STAT now to evaluate ETT position  CARDIOVASCULAR A:  Circulatory Shock, unknown cause, resolving with resolution of acidosis P:  - D/C epi drip - Titrate norepi drip slowly - EKG monitoring - Cards saw patient  RENAL A:   ESRD Metabolic acidosis P:   - Renal consult - CRRT to be started  GASTROINTESTINAL A:   Concern for ischemic bowel P:   - Abdominal CT - NPO - TF in AM  HEMATOLOGIC A:   No active issues P:  - CBC - Transfuse per ICU protocol  INFECTIOUS A:   Concern for ischemic bowel or intraabdominal infection P:   - Levaquin - Flagyl - Pan culture  ENDOCRINE A:   DM   P:   - ISS - CBG - No basal insulin in medication list, will ask diabetic coordinator to see for basal insulin needs  NEUROLOGIC A:   Awake post code P:   RASS goal: 0 - Fentanyl drip - Precedex drip -  Avoid other sedating medications as able  FAMILY  - Updates: No family  bedside to update  - Inter-disciplinary family meet or Palliative Care meeting due by:  day 7  The patient is critically ill with multiple organ systems failure and requires high complexity decision making for assessment and support, frequent evaluation and titration of therapies, application of advanced monitoring technologies and extensive interpretation of multiple databases.   Critical Care Time devoted to patient care services described in this note is  105  Minutes. This time reflects time of care of this signee Dr Jennet Maduro. This critical care time does not reflect procedure time, or teaching time or supervisory time of PA/NP/Med student/Med Resident etc but could involve care discussion time.  Rush Farmer, M.D. [facility] Pulmonary/Critical Care Medicine. Pager: [phone]. After hours pager: [phone].  04/16/2017, 2:21 PM

## 2017-04-16 NOTE — Progress Notes (Signed)
04/16/2017 1445 Pt. Awake, following commands, extremely agitated at this time. CCM on floor, MD to place orders for sedation. Will continue to closely monitor patient.  Sarin Comunale, Arville Lime

## 2017-04-16 NOTE — Progress Notes (Signed)
Patient has made amazing improvement since she left the operating room.  She is now moving all of her extremities to commands.  Her pressors have been discontinued.  She continues to have drainage from the JP in her right groin.  Because of the events in the operating room today, I will not take her back to the operating room for her bleeding.  I recommended manual pressure for the use of a Fem I will consider taking her back to the operating room on Friday if the bleeding has not stopped stop.  And she remained stable.  She still needs to have the endovascular portion of her procedure done as well as transmetatarsal amputation by Dr. Sharol Given.  [name]

## 2017-04-16 NOTE — Progress Notes (Signed)
Spoke with Dr. Elsworth Soho regarding CT head and abd order. Patient is currently on CRRT and femstop applying pressure to right groin because of bleeding issues. Dr. Elsworth Soho okay with waiting on CT scans at this time. Patient is able to follow commands, move all extremities with equal strength, and nod appropriately to questions. Will continue to monitor.

## 2017-04-16 NOTE — Progress Notes (Signed)
04/16/2017 1700 Return of oozing from right groin site, manual pressure held for 30 minutes. Dr. Trula Slade paged and made aware. MD to see patient. Drainage from JP reviewed with MD.  Chapman Fitch 1745 Dr. Trula Slade at bedside and manual pressure continues to be held on right groin oozing. Verbal order Dr. Trula Slade ok to place Femstop on site, only not to occlude the blood flow to RLE. Manual pressure continues to be held until [facility] can be obtained. MD made aware of elevated BP and amount of drainage from JP. No new orders at this time related to BP or JP.  1830 Femstop applied to site, oozing at right groin stopped at this time. Doppler pulses on RLE present. Dr. Trula Slade paged and updated on controlled bleeding and made aware of continued elevated BP. Verbal orders received for Hydralazine 10 mg IV q1h prn for SBP >160 and labetalol 10 mg IV q1h prn for SBP >160. MD states goal BP is SBP of 140. Orders enacted. Will continue to closely monitor patient.  Johan Antonacci, Arville Lime

## 2017-04-16 NOTE — OR Nursing (Signed)
CPR initiated after patient became unstable following lower extremity angiogram and was continued intermittently from estimated time of 1145 to 1330.

## 2017-04-16 NOTE — Progress Notes (Signed)
eLink Physician-Brief Progress Note Patient Name: [name] DOB: Feb 14, 1976 MRN: [id number]   Date of Service  04/16/2017  HPI/Events of Note  SUP  eICU Interventions  protonix     Intervention Category Intermediate Interventions: Best-practice therapies (e.g. DVT, beta blocker, etc.)  Leanna Sato. [place] 04/16/2017, 8:39 PM

## 2017-04-16 NOTE — Progress Notes (Signed)
Pt. With mild oozing from right groin. Manual pressure held x 20 minutes. Dr. Trula Slade paged from [facility] lab and made aware. No new orders at this time. Will continue to closely monitor patient.  Shanitha Twining, Arville Lime

## 2017-04-16 NOTE — Progress Notes (Signed)
Inpatient Diabetes Program Recommendations  AACE/ADA: New Consensus Statement on Inpatient Glycemic Control (2015)  Target Ranges:  Prepandial:   less than 140 mg/dL      Peak postprandial:   less than 180 mg/dL (1-2 hours)      Critically ill patients:  140 - 180 mg/dL   Lab Results  Component Value Date   GLUCAP 314 (H) 04/16/2017   HGBA1C 9.5 (H) 03/24/2017   Review of Glycemic Control  Diabetes history: DM 2 Outpatient Diabetes medications: Humalog Mix 50/50 26 units BID, Humalog 6 units tid with meals Current orders for Inpatient glycemic control: None  Inpatient Diabetes Program Recommendations:    Glucose elevated 314 mg/dl this am. Patient is on insulin at home. Please consider: Lantus 10 units Q24 hours Novolog Sensitive Correction 0-9 units tid Novolog HS scale 0-5 units.  Thanks,  Tama Headings RN, MSN, [facility] Inpatient Diabetes Coordinator Team Pager [phone] (8a-5p)

## 2017-04-16 NOTE — Progress Notes (Signed)
eLink Physician-Brief Progress Note Patient Name: [name] DOB: [birth year] MRN: [id number]   Date of Service  04/16/2017  HPI/Events of Note  Hypertensive On multiple meds at home, npo now  eICU Interventions  Off pressors - so can pull 50/h on CRRT Increase hydrallazine 10-40 q 4h prn If remains high, add clonidine patch or consider cleviprex     Intervention Category Intermediate Interventions: Hypertension - evaluation and management  [name] V. Shyrl Obi 04/16/2017, 11:26 PM

## 2017-04-16 NOTE — H&P (Signed)
[name] is an 41 y.o. female.   Chief Complaint: Right foot pain HPI: Patient is a 41 year old woman with diabetes insensate neuropathy with peripheral vascular disease with gangrenous changes to the right forefoot.  Patient also has end-stage renal disease on dialysis Monday Wednesday Friday.  Past Medical History:  Diagnosis Date  . CHF (congestive heart failure) ([place])   . Chronic anemia    2nd to renal disease  . CIN III (cervical intraepithelial neoplasia grade III) with severe dysplasia    S/P LEEP AND CONE  . Coronary artery disease    Status post cardiac catheterization June 2012 scattered coronary artery disease/atherosclerosis with 70-80% stenosis in a small right PDA.  . Diabetic Charcot's joint disease ([place])   . DM (diabetes mellitus) ([place])    Long-term insulin  . End stage renal disease on dialysis ([place]) 05/02/11   [facility]; M; W, F; last time 05/01/11  . Fracture of 5th metatarsal 2016   Right  . Gastroparesis   . Heart murmur   . 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 History of abscesses in groins 12/06/2010  . Hyperlipidemia    Hypertriglyceridemia 449 HDL 25  . Hypertension   . Migraines    "just on dialysis days"  . Peripheral neuropathy    related to DM  . Peripheral vascular disease (DISH)    Tibial occlusive disease evaluated by Dr. Kellie Simmering in August 2011. Medical therapy  . Renal insufficiency    Dialysis since 2012  . Tobacco use disorder    Discontinued March 2012  . Trimalleolar fracture of ankle, closed 02/09/2015   Right    Past Surgical History:  Procedure Laterality Date  . ABDOMINAL AORTOGRAM W/LOWER EXTREMITY [name] 03/27/2017   Procedure: ABDOMINAL AORTOGRAM W/LOWER EXTREMITY;  Surgeon: Conrad Beaman, MD;  Location: [facility];  Service: Cardiovascular;  Laterality: [name];  . AV FISTULA PLACEMENT  04/2010  . CARDIAC CATHETERIZATION [name] 02/10/2015   Procedure: Left Heart Cath and Coronary Angiography;  Surgeon: Dixie Dials, MD;  Location: [facility];  Service: Cardiovascular;  Laterality: [name];  . CARDIAC CATHETERIZATION [name] 02/21/2016   Procedure: Right Heart Cath;  Surgeon: Jolaine Artist, MD;  Location: [facility];  Service: Cardiovascular;  Laterality: [name];  . CERVICAL BIOPSY  W/ LOOP ELECTRODE EXCISION     h/o  . CERVICAL CONE BIOPSY     h/o  . COLPOSCOPY    . DILATION AND CURETTAGE OF UTERUS  2009  . ORIF ANKLE FRACTURE Right 02/09/2015  . ORIF ANKLE FRACTURE Right 02/16/2015   Procedure: OPEN REDUCTION INTERNAL FIXATION (ORIF) RIGHT ANKLE FRACTURE;  Surgeon: Altamese , MD;  Location: [facility];  Service: Orthopedics;  Laterality: Right;    Family History  Problem Relation Age of Onset  . Diabetes Mother   . Hypertension Mother   . Diabetes Father   . Hyperlipidemia Father   . Hypertension Father   . Diabetes Sister   . Stroke Maternal Grandmother   . Diabetes Sister   . Breast cancer Maternal Aunt        Age 65's   Social History:  reports that she quit smoking about 5 years ago. Her smoking use included cigarettes. She has a 3.00 pack-year smoking history. she has never used smokeless tobacco. She reports that she does not drink alcohol or use drugs.  Allergies:  Allergies  Allergen Reactions  . Cephalexin Other (See Comments)    Reaction unknown- Childhood allergy Tolerated  Ceftriaxone in the past  . Sulfamethoxazole-Trimethoprim Other (See Comments)    Unknown reaction. Pt states that she was told by her mother that she had allergy to Bactrim as a child.    Medications Prior to Admission  Medication Sig Dispense Refill  . amLODipine (NORVASC) 10 MG tablet Take 1 tablet (10 mg total) by mouth daily. (Patient taking differently: Take 10 mg 2 (two) times daily by mouth. ) 30 tablet 1  . amoxicillin-clavulanate (AUGMENTIN) 500-125 MG tablet Take 1 tablet See admin instructions by mouth. Three times a week after dialysis    . aspirin EC 81 MG EC tablet Take 1 tablet (81 mg total) by mouth  daily. 30 tablet 0  . atorvastatin (LIPITOR) 40 MG tablet Take 40 mg by mouth daily.    . cinacalcet (SENSIPAR) 30 MG tablet Take 2 tablets (60 mg total) by mouth daily with supper. (Patient taking differently: Take 30 mg daily with supper by mouth. ) 60 tablet 0  . cloNIDine (CATAPRES) 0.3 MG tablet Take 1 tablet (0.3 mg total) by mouth 3 (three) times daily. 90 tablet 0  . cyclobenzaprine (FLEXERIL) 10 MG tablet Take 1 tablet (10 mg total) by mouth at bedtime. (Patient taking differently: Take 10 mg by mouth at bedtime as needed for muscle spasms. ) 30 tablet 0  . ferric citrate (AURYXIA) 1 GM 210 MG(Fe) tablet Take 3 tablets 3 (three) times daily with meals by mouth.     Marland Kitchen HUMALOG MIX 50/50 KWIKPEN (50-50) 100 UNIT/ML Kwikpen Inject 26 Units into the skin 2 (two) times daily.     . hydrALAZINE (APRESOLINE) 100 MG tablet Take 1 tablet (100 mg total) by mouth every 8 (eight) hours. 90 tablet 1  . HYDROcodone-acetaminophen (NORCO) 5-325 MG tablet Take 1 tablet by mouth every 6 (six) hours as needed for moderate pain. 15 tablet 0  . insulin lispro (HUMALOG) 100 UNIT/ML injection Inject 6 Units into the skin 3 (three) times daily before meals.    . isosorbide mononitrate (IMDUR) 120 MG 24 hr tablet Take 1 tablet (120 mg total) by mouth daily. 30 tablet 1  . ketorolac (ACULAR) 0.5 % ophthalmic solution Place 1 drop 4 (four) times daily into the right eye.    . labetalol (NORMODYNE) 300 MG tablet Take 1 tablet (300 mg total) by mouth 3 (three) times daily. 270 tablet 3  . methyldopa (ALDOMET) 500 MG tablet Take 500 mg daily by mouth.  11  . multivitamin (RENA-VIT) TABS tablet Take 1 tablet by mouth at bedtime. 30 tablet 0  . prednisoLONE acetate (PRED FORTE) 1 % ophthalmic suspension Place 1 drop 4 (four) times daily into the right eye.    . promethazine (PHENERGAN) 12.5 MG tablet Take 12.5 mg every 6 (six) hours as needed by mouth for nausea or vomiting.   3  . RENVELA 800 MG tablet Take 4,800 mg by  mouth 3 (three) times daily with meals.     Marland Kitchen spironolactone (ALDACTONE) 100 MG tablet Take 100 mg daily by mouth.  11  . tobramycin-dexamethasone (TOBRADEX) ophthalmic ointment Place 1 application 4 (four) times daily into the right eye.    . traMADol (ULTRAM) 50 MG tablet Take 1 tablet (50 mg total) by mouth every 6 (six) hours as needed for moderate pain (or Headache unrelieved by tylenol). 20 tablet 0  . pentoxifylline (TRENTAL) 400 MG CR tablet Take 1 tablet (400 mg total) by mouth 3 (three) times daily with meals. (Patient not taking: Reported on  04/10/2017) 30 tablet 0    Results for orders placed or performed during the hospital encounter of 04/15/17 (from the past 48 hour(s))  Glucose, capillary     Status: Abnormal   Collection Time: 04/15/17  1:45 PM  Result Value Ref Range   Glucose-Capillary 276 (H) 65 - 99 mg/dL  APTT     Status: Abnormal   Collection Time: 04/15/17  1:56 PM  Result Value Ref Range   aPTT 39 (H) 24 - 36 seconds    Comment:        IF BASELINE aPTT IS ELEVATED, SUGGEST PATIENT RISK ASSESSMENT BE USED TO DETERMINE APPROPRIATE ANTICOAGULANT THERAPY.   CBC     Status: Abnormal   Collection Time: 04/15/17  1:56 PM  Result Value Ref Range   WBC 10.6 (H) 4.0 - 10.5 K/uL   RBC 3.48 (L) 3.87 - 5.11 MIL/uL   Hemoglobin 10.1 (L) 12.0 - 15.0 g/dL   HCT 32.4 (L) 36.0 - 46.0 %   MCV 93.1 78.0 - 100.0 fL   MCH 29.0 26.0 - 34.0 pg   MCHC 31.2 30.0 - 36.0 g/dL   RDW 18.3 (H) 11.5 - 15.5 %   Platelets 221 150 - 400 K/uL  Comprehensive metabolic panel     Status: Abnormal   Collection Time: 04/15/17  1:56 PM  Result Value Ref Range   Sodium 138 135 - 145 mmol/L   Potassium 3.6 3.5 - 5.1 mmol/L   Chloride 94 (L) 101 - 111 mmol/L   CO2 31 22 - 32 mmol/L   Glucose, Bld 266 (H) 65 - 99 mg/dL   BUN 20 6 - 20 mg/dL   Creatinine, Ser 3.61 (H) 0.44 - 1.00 mg/dL   Calcium 9.1 8.9 - 10.3 mg/dL   Total Protein 7.0 6.5 - 8.1 g/dL   Albumin 2.8 (L) 3.5 - 5.0 g/dL   AST 20  15 - 41 U/L   ALT 15 14 - 54 U/L   Alkaline Phosphatase 449 (H) 38 - 126 U/L   Total Bilirubin 0.7 0.3 - 1.2 mg/dL   GFR calc non Af Amer 15 (L) >60 mL/min   GFR calc Af Amer 17 (L) >60 mL/min    Comment: (NOTE) The eGFR has been calculated using the CKD EPI equation. This calculation has not been validated in all clinical situations. eGFR's persistently <60 mL/min signify possible Chronic Kidney Disease.    Anion gap 13 5 - 15  Protime-INR     Status: None   Collection Time: 04/15/17  1:56 PM  Result Value Ref Range   Prothrombin Time 14.5 11.4 - 15.2 seconds   INR 1.14   Surgical pcr screen     Status: None   Collection Time: 04/15/17  1:56 PM  Result Value Ref Range   MRSA, PCR NEGATIVE NEGATIVE   Staphylococcus aureus NEGATIVE NEGATIVE    Comment: (NOTE) The Xpert SA Assay (FDA approved for NASAL specimens in patients 52 years of age and older), is one component of a comprehensive surveillance program. It is not intended to diagnose infection nor to guide or monitor treatment.   Type and screen     Status: None   Collection Time: 04/15/17  2:11 PM  Result Value Ref Range   ABO/RH(D) A POS    Antibody Screen NEG    Sample Expiration 04/29/2017    Extend sample reason NO TRANSFUSIONS OR PREGNANCY IN THE PAST 3 MONTHS    No results found.  Review of Systems  All other systems reviewed and are negative.   Last menstrual period 10/09/2015. Physical Exam  Patient is alert, oriented, no adenopathy, well-dressed, normal affect, normal respiratory effort. Examination patient has black gangrenous changes to the third and fifth toe right foot there is no ascending cellulitis she does not have a pulse there is no drainage no odor no signs of infection.  Ischemic changes up to the MTP joint of the third and fifth toe.   Assessment/Plan Assessment: Gangrene right forefoot.  Plan: Patient is scheduled for revascularization surgery today.  I will follow-up after her  revascularization surgery and plan to proceed with a transmetatarsal amputation.  Risks and benefits are discussed including risk of the wound not healing need for additional surgery.  Patient states she understands and wished to proceed at this time.  Newt Minion, MD 04/16/2017, 6:40 AM

## 2017-04-16 NOTE — Progress Notes (Signed)
  Echocardiogram Echocardiogram Transesophageal has been performed.  Bobbye Charleston 04/16/2017, 2:14 PM

## 2017-04-16 NOTE — Consult Note (Signed)
Reason for Consult: To manage dialysis and dialysis related needs Referring Physician: Orlando Thalmann is an 41 y.o. female with DM, malignant HTN, CAD, PAD, restrictive cardiomayopathy with ascites as well as ESRD- MWF at [facility] was last there on 11/20 (holiday schedule) ran full treatment.  Today she presented for right femoral angiogram with intervention and stenting with a transmet.  During the OR- pt had extreme hypotension- req some CPR and epi which she would respond to transiently- noted to become more acidotic as well, last recorded pH was 7.11.  We were called to handle ESRD needs, felt that she would need CRRT to stabilize in ICU.  Remarkably she is more stable now , stirring and following commands- her pressor needs are down.  Her last K was 5.5, her AVF is patent but also had a trialysis catheter placed in her left groin   Dialyzes at [facility] (not getting there). MWF, 4 hours and 15 min HD Bath 2K/2.5 calc , Dialyzer opto 180, Heparin yes, 2000. Access left lower arm AVF. Profile #2-  hect 7 mircera 225 - got on 11/14 Last hgb 9.3, calc 8.7 , phos 9.0, pth 228  Also on sensipar 60 and ayryxia  Noted to be hypertensive as OP with 5 listed BP meds ?   Past Medical History:  Diagnosis Date  . CHF (congestive heart failure) ([place])   . Chronic anemia    2nd to renal disease  . CIN III (cervical intraepithelial neoplasia grade III) with severe dysplasia    S/P LEEP AND CONE  . Coronary artery disease    Status post cardiac catheterization June 2012 scattered coronary artery disease/atherosclerosis with 70-80% stenosis in a small right PDA.  . Diabetic Charcot's joint disease ([place])   . DM (diabetes mellitus) ([place])    Long-term insulin  . End stage renal disease on dialysis ([place]) 05/02/11   [facility]; M; W, F; last time 05/01/11  . Fracture of 5th metatarsal 2016   Right  . Gastroparesis   . Heart murmur   . 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 History of abscesses in groins 12/06/2010   . Hyperlipidemia    Hypertriglyceridemia 449 HDL 25  . Hypertension   . Migraines    "just on dialysis days"  . Peripheral neuropathy    related to DM  . Peripheral vascular disease ([place])    Tibial occlusive disease evaluated by Dr.  Simmering in August 2011. Medical therapy  . Renal insufficiency    Dialysis since 2012  . Tobacco use disorder    Discontinued March 2012  . Trimalleolar fracture of ankle, closed 02/09/2015   Right    Past Surgical History:  Procedure Laterality Date  . ABDOMINAL AORTOGRAM W/LOWER EXTREMITY [name] 03/27/2017   Procedure: ABDOMINAL AORTOGRAM W/LOWER EXTREMITY;  Surgeon: Conrad [place], MD;  Location: [facility];  Service: Cardiovascular;  Laterality: [name];  . AV FISTULA PLACEMENT  04/2010  . CARDIAC CATHETERIZATION [name] 02/10/2015   Procedure: Left Heart Cath and Coronary Angiography;  Surgeon: Dixie Dials, MD;  Location: [facility];  Service: Cardiovascular;  Laterality: [name];  . CARDIAC CATHETERIZATION [name] 02/21/2016   Procedure: Right Heart Cath;  Surgeon: Jolaine Artist, MD;  Location: [facility];  Service: Cardiovascular;  Laterality: [name];  . CERVICAL BIOPSY  W/ LOOP ELECTRODE EXCISION     h/o  . CERVICAL CONE BIOPSY     h/o  . COLPOSCOPY    .  DILATION AND CURETTAGE OF UTERUS  2009  . ORIF ANKLE FRACTURE Right 02/09/2015  . ORIF ANKLE FRACTURE Right 02/16/2015   Procedure: OPEN REDUCTION INTERNAL FIXATION (ORIF) RIGHT ANKLE FRACTURE;  Surgeon: Altamese [place], MD;  Location: [facility];  Service: Orthopedics;  Laterality: Right;    Family History  Problem Relation Age of Onset  . Diabetes Mother   . Hypertension Mother   . Diabetes Father   . Hyperlipidemia Father   . Hypertension Father   . Diabetes Sister   . Stroke Maternal Grandmother   . Diabetes Sister   . Breast cancer Maternal Aunt        Age 55's    Social History:  reports that she quit smoking about 5 years ago. Her smoking use included cigarettes. She has a 3.00  pack-year smoking history. she has never used smokeless tobacco. She reports that she does not drink alcohol or use drugs.  Allergies:  Allergies  Allergen Reactions  . Cephalexin Other (See Comments)    Reaction unknown- Childhood allergy Tolerated Ceftriaxone in the past  . Sulfamethoxazole-Trimethoprim Other (See Comments)    Unknown reaction. Pt states that she was told by her mother that she had allergy to Bactrim as a child.    Medications: I have reviewed the patient's current medications.   Results for orders placed or performed during the hospital encounter of 04/16/17 (from the past 48 hour(s))  I-STAT 4, (NA,K, GLUC, HGB,HCT)     Status: Abnormal   Collection Time: 04/16/17  6:49 AM  Result Value Ref Range   Sodium 135 135 - 145 mmol/L   Potassium 3.7 3.5 - 5.1 mmol/L   Glucose, Bld 408 (H) 65 - 99 mg/dL   HCT 29.0 (L) 36.0 - 46.0 %   Hemoglobin 9.9 (L) 12.0 - 15.0 g/dL  Glucose, capillary     Status: Abnormal   Collection Time: 04/16/17  7:42 AM  Result Value Ref Range   Glucose-Capillary 314 (H) 65 - 99 mg/dL  I-STAT 7, (LYTES, BLD GAS, ICA, H+H)     Status: Abnormal   Collection Time: 04/16/17  9:47 AM  Result Value Ref Range   pH, Arterial 7.486 (H) 7.350 - 7.450   pCO2 arterial 42.3 32.0 - 48.0 mmHg   pO2, Arterial 66.0 (L) 83.0 - 108.0 mmHg   Bicarbonate 31.7 (H) 20.0 - 28.0 mmol/L   TCO2 33 (H) 22 - 32 mmol/L   O2 Saturation 93.0 %   Acid-Base Excess 8.0 (H) 0.0 - 2.0 mmol/L   Sodium 135 135 - 145 mmol/L   Potassium 3.8 3.5 - 5.1 mmol/L   Calcium, Ion 1.13 (L) 1.15 - 1.40 mmol/L   HCT 26.0 (L) 36.0 - 46.0 %   Hemoglobin 8.8 (L) 12.0 - 15.0 g/dL   Patient temperature 37.9 C    Sample type ARTERIAL   I-STAT 4, (NA,K, GLUC, HGB,HCT)     Status: Abnormal   Collection Time: 04/16/17  9:56 AM  Result Value Ref Range   Sodium 136 135 - 145 mmol/L   Potassium 3.9 3.5 - 5.1 mmol/L   Glucose, Bld 284 (H) 65 - 99 mg/dL   HCT 25.0 (L) 36.0 - 46.0 %    Hemoglobin 8.5 (L) 12.0 - 15.0 g/dL  I-STAT 4, (NA,K, GLUC, HGB,HCT)     Status: Abnormal   Collection Time: 04/16/17 10:36 AM  Result Value Ref Range   Sodium 136 135 - 145 mmol/L   Potassium 3.8 3.5 - 5.1 mmol/L  Glucose, Bld 248 (H) 65 - 99 mg/dL   HCT 24.0 (L) 36.0 - 46.0 %   Hemoglobin 8.2 (L) 12.0 - 15.0 g/dL  POCT Activated clotting time     Status: None   Collection Time: 04/16/17 11:00 AM  Result Value Ref Range   Activated Clotting Time 180 seconds  I-STAT 4, (NA,K, GLUC, HGB,HCT)     Status: Abnormal   Collection Time: 04/16/17 11:10 AM  Result Value Ref Range   Sodium 136 135 - 145 mmol/L   Potassium 3.8 3.5 - 5.1 mmol/L   Glucose, Bld 200 (H) 65 - 99 mg/dL   HCT 24.0 (L) 36.0 - 46.0 %   Hemoglobin 8.2 (L) 12.0 - 15.0 g/dL  POCT Activated clotting time     Status: None   Collection Time: 04/16/17 11:21 AM  Result Value Ref Range   Activated Clotting Time 197 seconds  I-STAT 7, (LYTES, BLD GAS, ICA, H+H)     Status: Abnormal   Collection Time: 04/16/17 11:40 AM  Result Value Ref Range   pH, Arterial 7.335 (L) 7.350 - 7.450   pCO2 arterial 42.2 32.0 - 48.0 mmHg   pO2, Arterial 47.0 (L) 83.0 - 108.0 mmHg   Bicarbonate 22.5 20.0 - 28.0 mmol/L   TCO2 24 22 - 32 mmol/L   O2 Saturation 80.0 %   Acid-base deficit 3.0 (H) 0.0 - 2.0 mmol/L   Sodium 137 135 - 145 mmol/L   Potassium 4.8 3.5 - 5.1 mmol/L   Calcium, Ion 1.10 (L) 1.15 - 1.40 mmol/L   HCT 27.0 (L) 36.0 - 46.0 %   Hemoglobin 9.2 (L) 12.0 - 15.0 g/dL   Patient temperature HIDE    Sample type ARTERIAL   I-STAT 7, (LYTES, BLD GAS, ICA, H+H)     Status: Abnormal   Collection Time: 04/16/17 12:19 PM  Result Value Ref Range   pH, Arterial 7.080 (LL) 7.350 - 7.450   pCO2 arterial 61.9 (H) 32.0 - 48.0 mmHg   pO2, Arterial 55.0 (L) 83.0 - 108.0 mmHg   Bicarbonate 18.3 (L) 20.0 - 28.0 mmol/L   TCO2 20 (L) 22 - 32 mmol/L   O2 Saturation 74.0 %   Acid-base deficit 12.0 (H) 0.0 - 2.0 mmol/L   Sodium 138 135 - 145  mmol/L   Potassium 4.5 3.5 - 5.1 mmol/L   Calcium, Ion 1.04 (L) 1.15 - 1.40 mmol/L   HCT 32.0 (L) 36.0 - 46.0 %   Hemoglobin 10.9 (L) 12.0 - 15.0 g/dL   Patient temperature HIDE    Sample type ARTERIAL   I-STAT 7, (LYTES, BLD GAS, ICA, H+H)     Status: Abnormal   Collection Time: 04/16/17  1:10 PM  Result Value Ref Range   pH, Arterial 7.111 (LL) 7.350 - 7.450   pCO2 arterial 69.3 (HH) 32.0 - 48.0 mmHg   pO2, Arterial 42.0 (L) 83.0 - 108.0 mmHg   Bicarbonate 22.7 20.0 - 28.0 mmol/L   TCO2 25 22 - 32 mmol/L   O2 Saturation 66.0 %   Acid-base deficit 8.0 (H) 0.0 - 2.0 mmol/L   Sodium 139 135 - 145 mmol/L   Potassium 5.5 (H) 3.5 - 5.1 mmol/L   Calcium, Ion 0.97 (L) 1.15 - 1.40 mmol/L   HCT 29.0 (L) 36.0 - 46.0 %   Hemoglobin 9.9 (L) 12.0 - 15.0 g/dL   Patient temperature 35.2 C    Sample type ARTERIAL    Comment NOTIFIED PHYSICIAN     Dg Chest [facility]  1 View  Result Date: 04/16/2017 CLINICAL DATA:  Endotracheal tube placement. EXAM: PORTABLE CHEST 1 VIEW COMPARISON:  Radiographs of February 02, 2017. FINDINGS: Stable cardiomegaly is noted. Endotracheal tube is noted projected over tracheal air shadow with distal tip 5 cm above the carina. Nasogastric tube is seen with distal tip at gastroesophageal junction. No pneumothorax is noted. Bilateral perihilar and basilar pulmonary edema is noted. Bony thorax is unremarkable. IMPRESSION: Endotracheal tube in grossly good position. Nasogastric tube tip appears to be in expected position of gastroesophageal junction. Probable bilateral pulmonary edema is noted. Electronically Signed   By: Marijo Conception, M.D.   On: 04/16/2017 14:52    ROS: unable to determine due to pt condition  Blood pressure 119/64, pulse 65, temperature (!) 95.5 F (35.3 C), resp. rate 20, last menstrual period 10/09/2015, SpO2 100 %. General appearance: toxic and sedated, intubated Eyes: conjunctivae/corneas clear. PERRL, EOM's intact. Fundi benign. Resp: rhonchi  bilaterally Cardio: regular rate and rhythm, S1, S2 normal, no murmur, click, rub or gallop GI: abnormal findings:  ascites and hypoactive bowel sounds Extremities: venous stasis dermatitis noted and has ischemic changes mostly on right, they did not complete the tranmet amp still with drain in right groin, HD cath in left groin  her left AVF is patent  Assessment/Plan: 41 year old WF with multiple medical issues "vasculopath" admitted for elective management of PAD - developed severe hypotension and metabolic acidosis in the OR- req CPR, pressors  1 PAD- almost completed invasive management of PAD per vascular 2. Acute decompensation- acute metabolic acidosis and hemodynamic collapse.  So far responded to maximal medical therapy in ICU- changes in abdomen suspicious for acute ischemic bowel ?  Going for CT when stable- medical management for now.  Started vanc and flagyl  3 ESRD: normally MWF via AVF- got treatment on 11/20.  Right now needs support for acidosis and mild hyerkalemia- left groin trialysis placed today, to initiate CRRT once fluids up from pharmacy  3 Hypertension: usually very hypertensive- now req pressors  4. Anemia of ESRD: hgb 8's- sounds like is on ma ESA and most recent hgb is 9- cont supportive care and transfuse as needed  5. Metabolic Bone Disease: will resume sensipar and binders when eating- hold vit D for now as well    Ayriana Wix A 04/16/2017, 2:57 PM

## 2017-04-16 NOTE — Anesthesia Procedure Notes (Signed)
Procedure Name: Intubation Date/Time: 04/16/2017 9:12 AM Performed by: Carney Living, CRNA Pre-anesthesia Checklist: Patient identified, Emergency Drugs available, Suction available, Patient being monitored and Timeout performed Patient Re-evaluated:Patient Re-evaluated prior to induction Oxygen Delivery Method: Circle system utilized Preoxygenation: Pre-oxygenation with 100% oxygen Induction Type: IV induction Ventilation: Mask ventilation without difficulty Laryngoscope Size: Mac and 4 Grade View: Grade II Tube type: Oral Tube size: 7.5 mm Number of attempts: 1 Airway Equipment and Method: Stylet Placement Confirmation: ETT inserted through vocal cords under direct vision,  positive ETCO2 and breath sounds checked- equal and bilateral Secured at: 23 cm Tube secured with: Tape Dental Injury: Teeth and Oropharynx as per pre-operative assessment

## 2017-04-16 NOTE — Progress Notes (Addendum)
Pharmacy Antibiotic Note  [name] is a 41 y.o. female s/p cardiac arrest with concern for ischemic bowl or intraabdominal infection Pharmacy has been consulted for levaquin dosing (she is also on flagyl)  Plan:   -Levaquin 750mg  IV q48hr -Will follow renal function, cultures and clinical progress    Temp (24hrs), Avg:95.5 F (35.3 C), Min:91.9 F (33.3 C), Max:97.7 F (36.5 C)  Recent Labs  Lab 04/15/17 1356  WBC 10.6*  CREATININE 3.61*    Estimated Creatinine Clearance: 26.6 mL/min (A) (by C-G formula based on SCr of 3.61 mg/dL (H)).    Allergies  Allergen Reactions  . Cephalexin Other (See Comments)    Reaction unknown- Childhood allergy Tolerated Ceftriaxone in the past  . Sulfamethoxazole-Trimethoprim Other (See Comments)    Unknown reaction. Pt states that she was told by her mother that she had allergy to Bactrim as a child.    Antimicrobials this admission: 11/21 levaquin 11/21 metronidazole  Dose adjustments this admission:   Microbiology results: 11/20 MRSA PCR- neg  Thank you for allowing pharmacy to be a part of this patient's care.  Hildred Laser, Pharm D 04/16/2017 3:39 PM

## 2017-04-16 NOTE — Anesthesia Procedure Notes (Signed)
Arterial Line Insertion Start/End11/21/2018 8:18 AM, 04/16/2017 8:44 AM Performed by: Carney Living, CRNA, CRNA  Patient location: Pre-op. Preanesthetic checklist: patient identified, IV checked, risks and benefits discussed and pre-op evaluation Lidocaine 1% used for infiltration Right, radial was placed Catheter size: 20 G Hand hygiene performed  and maximum sterile barriers used   Attempts: 3 Procedure performed without using ultrasound guided technique. Following insertion, dressing applied and Biopatch. Post procedure assessment: normal and unchanged  Patient tolerated the procedure well with no immediate complications.

## 2017-04-17 ENCOUNTER — Other Ambulatory Visit: Payer: Self-pay

## 2017-04-17 ENCOUNTER — Encounter (HOSPITAL_COMMUNITY): Payer: Self-pay | Admitting: General Practice

## 2017-04-17 ENCOUNTER — Inpatient Hospital Stay (HOSPITAL_COMMUNITY): Payer: Medicare Other

## 2017-04-17 DIAGNOSIS — J9601 Acute respiratory failure with hypoxia: Secondary | ICD-10-CM

## 2017-04-17 LAB — RENAL FUNCTION PANEL
ANION GAP: 11 (ref 5–15)
Albumin: 2.1 g/dL — ABNORMAL LOW (ref 3.5–5.0)
Albumin: 2.3 g/dL — ABNORMAL LOW (ref 3.5–5.0)
Anion gap: 13 (ref 5–15)
BUN: 26 mg/dL — AB (ref 6–20)
BUN: 19 mg/dL (ref 6–20)
CALCIUM: 8.4 mg/dL — AB (ref 8.9–10.3)
CALCIUM: 8.6 mg/dL — AB (ref 8.9–10.3)
CO2: 24 mmol/L (ref 22–32)
CO2: 26 mmol/L (ref 22–32)
CREATININE: 3.47 mg/dL — AB (ref 0.44–1.00)
Chloride: 100 mmol/L — ABNORMAL LOW (ref 101–111)
Chloride: 100 mmol/L — ABNORMAL LOW (ref 101–111)
Creatinine, Ser: 2.86 mg/dL — ABNORMAL HIGH (ref 0.44–1.00)
GFR calc Af Amer: 18 mL/min — ABNORMAL LOW (ref >60)
GFR calc Af Amer: 22 mL/min — ABNORMAL LOW (ref >60)
GFR calc non Af Amer: 15 mL/min — ABNORMAL LOW (ref >60)
GFR calc non Af Amer: 19 mL/min — ABNORMAL LOW (ref >60)
GLUCOSE: 122 mg/dL — AB (ref 65–99)
Glucose, Bld: 123 mg/dL — ABNORMAL HIGH (ref 65–99)
POTASSIUM: 4.2 mmol/L (ref 3.5–5.1)
Phosphorus: 4.6 mg/dL (ref 2.5–4.6)
Phosphorus: 4.2 mg/dL (ref 2.5–4.6)
Potassium: 3.9 mmol/L (ref 3.5–5.1)
SODIUM: 137 mmol/L (ref 135–145)
SODIUM: 137 mmol/L (ref 135–145)

## 2017-04-17 LAB — BASIC METABOLIC PANEL
Anion gap: 11 (ref 5–15)
BUN: 26 mg/dL — AB (ref 6–20)
CO2: 25 mmol/L (ref 22–32)
Calcium: 8.5 mg/dL — ABNORMAL LOW (ref 8.9–10.3)
Chloride: 101 mmol/L (ref 101–111)
Creatinine, Ser: 3.41 mg/dL — ABNORMAL HIGH (ref 0.44–1.00)
GFR calc Af Amer: 18 mL/min — ABNORMAL LOW (ref >60)
GFR, EST NON AFRICAN AMERICAN: 16 mL/min — AB (ref >60)
GLUCOSE: 122 mg/dL — AB (ref 65–99)
POTASSIUM: 3.9 mmol/L (ref 3.5–5.1)
Sodium: 137 mmol/L (ref 135–145)

## 2017-04-17 LAB — BLOOD GAS, ARTERIAL
Acid-Base Excess: 2.4 mmol/L — ABNORMAL HIGH (ref 0.0–2.0)
BICARBONATE: 26 mmol/L (ref 20.0–28.0)
FIO2: 50
LHR: 20 {breaths}/min
O2 SAT: 96.7 %
PEEP: 10 cmH2O
PH ART: 7.461 — AB (ref 7.350–7.450)
Patient temperature: 98.6
VT: 640 mL
pCO2 arterial: 37 mmHg (ref 32.0–48.0)
pO2, Arterial: 116 mmHg — ABNORMAL HIGH (ref 83.0–108.0)

## 2017-04-17 LAB — CBC
HCT: 26.7 % — ABNORMAL LOW (ref 36.0–46.0)
Hemoglobin: 8.3 g/dL — ABNORMAL LOW (ref 12.0–15.0)
MCH: 28.4 pg (ref 26.0–34.0)
MCHC: 31.1 g/dL (ref 30.0–36.0)
MCV: 91.4 fL (ref 78.0–100.0)
PLATELETS: 197 10*3/uL (ref 150–400)
RBC: 2.92 MIL/uL — AB (ref 3.87–5.11)
RDW: 17.7 % — AB (ref 11.5–15.5)
WBC: 8 10*3/uL (ref 4.0–10.5)

## 2017-04-17 LAB — MAGNESIUM
Magnesium: 1.9 mg/dL (ref 1.7–2.4)
Magnesium: 2.1 mg/dL (ref 1.7–2.4)

## 2017-04-17 LAB — POCT I-STAT 3, ART BLOOD GAS (G3+)
ACID-BASE EXCESS: 1 mmol/L (ref 0.0–2.0)
Bicarbonate: 27 mmol/L (ref 20.0–28.0)
O2 SAT: 91 %
PCO2 ART: 48.1 mmHg — AB (ref 32.0–48.0)
Patient temperature: 36.7
TCO2: 29 mmol/L (ref 22–32)
pH, Arterial: 7.356 (ref 7.350–7.450)
pO2, Arterial: 65 mmHg — ABNORMAL LOW (ref 83.0–108.0)

## 2017-04-17 LAB — GLUCOSE, CAPILLARY
GLUCOSE-CAPILLARY: 133 mg/dL — AB (ref 65–99)
GLUCOSE-CAPILLARY: 111 mg/dL — AB (ref 65–99)
Glucose-Capillary: 114 mg/dL — ABNORMAL HIGH (ref 65–99)
Glucose-Capillary: 114 mg/dL — ABNORMAL HIGH (ref 65–99)
Glucose-Capillary: 117 mg/dL — ABNORMAL HIGH (ref 65–99)
Glucose-Capillary: 117 mg/dL — ABNORMAL HIGH (ref 65–99)
Glucose-Capillary: 122 mg/dL — ABNORMAL HIGH (ref 65–99)

## 2017-04-17 LAB — PHOSPHORUS: Phosphorus: 4.5 mg/dL (ref 2.5–4.6)

## 2017-04-17 MED ORDER — CLEVIDIPINE BUTYRATE 0.5 MG/ML IV EMUL
0.0000 mg/h | INTRAVENOUS | Status: DC
  Administered 2017-04-17: 3 mg/h via INTRAVENOUS
  Administered 2017-04-17: 1 mg/h via INTRAVENOUS
  Administered 2017-04-17: 3 mg/h via INTRAVENOUS
  Filled 2017-04-17 (×3): qty 50

## 2017-04-17 MED ORDER — AMLODIPINE BESYLATE 5 MG PO TABS
10.0000 mg | ORAL_TABLET | Freq: Every day | ORAL | Status: DC
  Administered 2017-04-19 – 2017-04-28 (×9): 10 mg via ORAL
  Filled 2017-04-17 (×10): qty 2

## 2017-04-17 MED ORDER — ATORVASTATIN CALCIUM 40 MG PO TABS: 40.0000 mg | ORAL_TABLET | Freq: Every day | ORAL | Status: DC

## 2017-04-17 MED ORDER — CLONIDINE HCL 0.1 MG PO TABS: 0.1000 mg | ORAL_TABLET | Freq: Three times a day (TID) | ORAL | Status: DC

## 2017-04-17 MED ORDER — TRAMADOL HCL 50 MG PO TABS
50.0000 mg | ORAL_TABLET | Freq: Four times a day (QID) | ORAL | Status: DC | PRN
  Administered 2017-04-17 – 2017-04-18 (×2): 50 mg via ORAL
  Filled 2017-04-17 (×2): qty 1

## 2017-04-17 MED ORDER — LEVOFLOXACIN IN D5W 500 MG/100ML IV SOLN
500.0000 mg | INTRAVENOUS | Status: AC
  Administered 2017-04-18 – 2017-04-24 (×4): 500 mg via INTRAVENOUS
  Filled 2017-04-17 (×4): qty 100

## 2017-04-17 MED ORDER — MIDAZOLAM HCL 2 MG/2ML IJ SOLN
1.0000 mg | INTRAMUSCULAR | Status: DC | PRN
  Administered 2017-04-17 – 2017-04-22 (×17): 2 mg via INTRAVENOUS
  Filled 2017-04-17 (×19): qty 2

## 2017-04-17 MED ORDER — ORAL CARE MOUTH RINSE
15.0000 mL | Freq: Two times a day (BID) | OROMUCOSAL | Status: DC
  Administered 2017-04-18 – 2017-04-19 (×3): 15 mL via OROMUCOSAL

## 2017-04-17 MED ORDER — CLONIDINE HCL 0.2 MG PO TABS
0.3000 mg | ORAL_TABLET | Freq: Three times a day (TID) | ORAL | Status: DC
  Administered 2017-04-17 – 2017-04-19 (×4): 0.3 mg via ORAL
  Filled 2017-04-17 (×4): qty 1

## 2017-04-17 NOTE — Progress Notes (Signed)
Wasted 100 cc fentanyl in sink with witness Rosario Jacks, RN. Etta Quill

## 2017-04-17 NOTE — Progress Notes (Signed)
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  The patient was profoundly acidotic and once that was addressed patient had a stable rhythm and was brought to the ICU for CRRT to address profound metabolic acidosis.  Patient was unresponsive upon arrival but with stabilization of BP reportedly became responsive.  PCCM was consulted for vent and vital signs management.    SUBJECTIVE:  Awake alert  VITAL SIGNS: BP (!) 161/73   Pulse 80   Temp 98.8 F (37.1 C)   Resp 13   Wt 227 lb 4.7 oz (103.1 kg)   LMP 10/09/2015 Comment: pt on dialysis  SpO2 93%   BMI 27.67 kg/m   HEMODYNAMICS:    VENTILATOR SETTINGS: Vent Mode: PSV;CPAP FiO2 (%):  [40 %-100 %] 40 % Set Rate:  [20 bmp] 20 bmp Vt Set:  [640 mL] 640 mL PEEP:  [5 cmH20-12 cmH20] 5 cmH20 Pressure Support:  [5 cmH20] 5 cmH20 Plateau Pressure:  [26 cmH20-29 cmH20] 28 cmH20  INTAKE / OUTPUT: I/O last 3 completed shifts: In: 9000.4 [P.O.:1600; I.V.:7020.4; NG/GT:30; IV Piggyback:350] Out: 2618 [Emesis/NG output:300; Drains:470; YKDXI:3382; Blood:300]  PHYSICAL EXAMINATION: General: Well-nourished well-developed female no acute distress  HEENT: Endotracheal tube to ventilator no JVD appreciated PSY: Normal affect Neuro: Intact CV: Heart sounds are regular rate and rhythm PULM: Decreased breath sounds at the base GI: Abdomen tender, Jackson-Pratt drainage with decreased amount of drainage Extremities: Lower extremities with ischemic changes on right foot cool Skin: no rashes or lesions   LABS:  BMET Recent Labs   Lab 04/15/17 1356  04/16/17 1110  04/16/17 1310 04/16/17 2032 04/17/17 0451  NA 138   < > 136   < > 139 137 137  137  K 3.6   < > 3.8   < > 5.5* 3.7 3.9  3.9  CL 94*  --   --   --   --  99* 100*  101  CO2 31  --   --   --   --  26 24  25   BUN 20  --   --   --   --  31* 26*  26*  CREATININE 3.61*  --   --   --   --  4.04* 3.47*  3.41*  GLUCOSE 266*   < > 200*  --   --  151* 122*  122*   < > = values in this interval not displayed.    Electrolytes Recent Labs  Lab 04/15/17 1356 04/16/17 2032 04/17/17 0451  CALCIUM 9.1 8.1* 8.4*  8.5*  MG  --   --  1.9  PHOS  --  5.5* 4.6  4.5    CBC Recent Labs  Lab 04/16/17 1822 04/16/17 2325 04/17/17 0451  WBC 14.7* 8.9 8.0  HGB 8.4* 8.2* 8.3*  HCT 26.6* 25.2* 26.7*  PLT 209 174 197    Coag's Recent Labs  Lab 04/15/17 1356  APTT 39*  INR 1.14    Sepsis Markers No results for input(s): LATICACIDVEN, PROCALCITON, O2SATVEN in the last 168 hours.  ABG Recent Labs  Lab  04/16/17 1310 04/16/17 1506 04/17/17 0500  PHART 7.111* 7.432 7.461*  PCO2ART 69.3* 39.2 37.0  PO2ART 42.0* 88.0 116*    Liver Enzymes Recent Labs  Lab 04/15/17 1356 04/16/17 2032 04/17/17 0451  AST 20  --   --   ALT 15  --   --   ALKPHOS 449*  --   --   BILITOT 0.7  --   --   ALBUMIN 2.8* 2.1* 2.1*    Cardiac Enzymes No results for input(s): TROPONINI, PROBNP in the last 168 hours.  Glucose Recent Labs  Lab 04/16/17 1505 04/16/17 1615 04/16/17 2008 04/16/17 2343 04/17/17 0316 04/17/17 0807  GLUCAP 188* 170* 145* 122* 114* 114*    Imaging Dg Chest 1 View  Result Date: 04/16/2017 INDICATION: 41 year old female with acute cardiopulmonary arrest which occurred spontaneously during right femoral endarterectomy. There was some evidence of right ventricular dilatation by emergent trans esophageal echocardiography. Interventional Radiology was requested to perform emergent pulmonary arteriography to evaluate for acute pulmonary  embolism as the cause of cardiac arrest. If pulmonary embolism is identified, thrombolysis may be performed. EXAM: IR HYBRID TRAUMA EMBOLIZATION; DG C-ARM 61-120 MIN; CHEST  1 VIEW COMPARISON:  None MEDICATIONS: None. ANESTHESIA/SEDATION: Patient currently under general anesthesia and being resuscitated by the anesthesiology service. FLUOROSCOPY TIME:  Fluoroscopy Time: 6 minutes 42 seconds COMPLICATIONS: None immediate. TECHNIQUE: Informed written consent was obtained from the patient after a thorough discussion of the procedural risks, benefits and alternatives. All questions were addressed. Maximal Sterile Barrier Technique was utilized including caps, mask, sterile gowns, sterile gloves, sterile drape, hand hygiene and skin antiseptic. A timeout was performed prior to the initiation of the procedure. A left common femoral venous access had already been obtained by Dr. Trula Slade. In between cycles of chest compressions, a 6 French MPA catheter was advanced over a Bentson wire into the right heart. Using a glidewire, the catheter was then advanced through the heart and into the main pulmonary artery. An arteriogram was performed. The main, central and lobar pulmonary arteries are widely patent. There is no evidence of filling defect to suggest a large volume pulmonary embolus. Therefore, no thrombolysis was performed. The catheter was removed. FINDINGS: The main, central and lobar pulmonary arteries are widely patent. There is no evidence of filling defect to suggest a large volume pulmonary embolus. IMPRESSION: Pulmonary angiography is negative for large volume pulmonary embolus. Signed, Criselda Peaches, MD [facility] Radiology Electronically Signed   By: Jacqulynn Cadet M.D.   On: 04/16/2017 16:35   Dg Chest Port 1 View  Result Date: 04/17/2017 CLINICAL DATA:  Hypoxia EXAM: PORTABLE CHEST 1 VIEW COMPARISON:  April 16, 2017 FINDINGS: Endotracheal tube  tip is 6.0 cm above the carina. Nasogastric tube tip and side port are below the diaphragm. No pneumothorax. There is a small right pleural effusion. No edema or consolidation. There is cardiomegaly with pulmonary vascularity within normal limits. No adenopathy. There is aortic atherosclerosis. No evident bone lesions. IMPRESSION: Tube positions as described without pneumothorax. Stable cardiomegaly. Small right pleural effusion. No edema or consolidation. There is aortic atherosclerosis. Aortic Atherosclerosis (ICD10-I70.0). Electronically Signed   By: Lowella Grip III M.D.   On: 04/17/2017 09:13   Dg Chest Port 1 View  Result Date: 04/16/2017 CLINICAL DATA:  Endotracheal tube placement. EXAM: PORTABLE CHEST 1 VIEW COMPARISON:  Radiographs of February 02, 2017. FINDINGS: Stable cardiomegaly is noted. Endotracheal tube is noted projected over tracheal air shadow with distal tip 5  cm above the carina. Nasogastric tube is seen with distal tip at gastroesophageal junction. No pneumothorax is noted. Bilateral perihilar and basilar pulmonary edema is noted. Bony thorax is unremarkable. IMPRESSION: Endotracheal tube in grossly good position. Nasogastric tube tip appears to be in expected position of gastroesophageal junction. Probable bilateral pulmonary edema is noted. Electronically Signed   By: Marijo Conception, M.D.   On: 04/16/2017 14:52   Ir Hybrid Trauma Embolization  Result Date: 04/16/2017 INDICATION: 41 year old female with acute cardiopulmonary arrest which occurred spontaneously during right femoral endarterectomy. There was some evidence of right ventricular dilatation by emergent trans esophageal echocardiography. Interventional Radiology was requested to perform emergent pulmonary arteriography to evaluate for acute pulmonary embolism as the cause of cardiac arrest. If pulmonary embolism is identified, thrombolysis may be performed. EXAM: IR HYBRID TRAUMA EMBOLIZATION; DG C-ARM 61-120 MIN;  CHEST  1 VIEW COMPARISON:  None MEDICATIONS: None. ANESTHESIA/SEDATION: Patient currently under general anesthesia and being resuscitated by the anesthesiology service. FLUOROSCOPY TIME:  Fluoroscopy Time: 6 minutes 42 seconds COMPLICATIONS: None immediate. TECHNIQUE: Informed written consent was obtained from the patient after a thorough discussion of the procedural risks, benefits and alternatives. All questions were addressed. Maximal Sterile Barrier Technique was utilized including caps, mask, sterile gowns, sterile gloves, sterile drape, hand hygiene and skin antiseptic. A timeout was performed prior to the initiation of the procedure. A left common femoral venous access had already been obtained by Dr. Trula Slade. In between cycles of chest compressions, a 6 French MPA catheter was advanced over a Bentson wire into the right heart. Using a glidewire, the catheter was then advanced through the heart and into the main pulmonary artery. An arteriogram was performed. The main, central and lobar pulmonary arteries are widely patent. There is no evidence of filling defect to suggest a large volume pulmonary embolus. Therefore, no thrombolysis was performed. The catheter was removed. FINDINGS: The main, central and lobar pulmonary arteries are widely patent. There is no evidence of filling defect to suggest a large volume pulmonary embolus. IMPRESSION: Pulmonary angiography is negative for large volume pulmonary embolus. Signed, Criselda Peaches, MD [facility] Radiology Electronically Signed   By: Jacqulynn Cadet M.D.   On: 04/16/2017 16:35   Dg C-arm 1-60 Min  Result Date: 04/16/2017 INDICATION: 41 year old female with acute cardiopulmonary arrest which occurred spontaneously during right femoral endarterectomy. There was some evidence of right ventricular dilatation by emergent trans esophageal echocardiography. Interventional Radiology was requested to  perform emergent pulmonary arteriography to evaluate for acute pulmonary embolism as the cause of cardiac arrest. If pulmonary embolism is identified, thrombolysis may be performed. EXAM: IR HYBRID TRAUMA EMBOLIZATION; DG C-ARM 61-120 MIN; CHEST  1 VIEW COMPARISON:  None MEDICATIONS: None. ANESTHESIA/SEDATION: Patient currently under general anesthesia and being resuscitated by the anesthesiology service. FLUOROSCOPY TIME:  Fluoroscopy Time: 6 minutes 42 seconds COMPLICATIONS: None immediate. TECHNIQUE: Informed written consent was obtained from the patient after a thorough discussion of the procedural risks, benefits and alternatives. All questions were addressed. Maximal Sterile Barrier Technique was utilized including caps, mask, sterile gowns, sterile gloves, sterile drape, hand hygiene and skin antiseptic. A timeout was performed prior to the initiation of the procedure. A left common femoral venous access had already been obtained by Dr. Trula Slade. In between cycles of chest compressions, a 6 French MPA catheter was advanced over a Bentson wire into the right heart. Using a glidewire, the catheter was then advanced through the heart and into the main  pulmonary artery. An arteriogram was performed. The main, central and lobar pulmonary arteries are widely patent. There is no evidence of filling defect to suggest a large volume pulmonary embolus. Therefore, no thrombolysis was performed. The catheter was removed. FINDINGS: The main, central and lobar pulmonary arteries are widely patent. There is no evidence of filling defect to suggest a large volume pulmonary embolus. IMPRESSION: Pulmonary angiography is negative for large volume pulmonary embolus. Signed, Criselda Peaches, MD [facility] Radiology Electronically Signed   By: Jacqulynn Cadet M.D.   On: 04/16/2017 16:35     STUDIES:  Abdominal CT 11/21>>> Head CT 11/21>>>  CULTURES: Blood  11/21>>>  ANTIBIOTICS: Levaquin 11/21>>> Flagyl 11/21>>>  SIGNIFICANT EVENTS:  11/21>>> Cardiac arrest in OR  LINES/TUBES: ETT 11/21>>> L femoral HD catheter 11/21>>> R radial a-line 11/21>>>  DISCUSSION: 41 year old type I diabetic with all the classic complications with cardiac arrest in the OR for 2 hours that is now following commands so no need for cooling.  Rapidly coming off pressors with addressing of acidosis.  Cause of arrest I suspect is acidosis after reperfusion in a patient with no renal failure (ESRD) vs ischemic bowel.  04/17/2017 awake and alert on minimal ventilator settings we will assess for extubation.  ASSESSMENT / PLAN:  PULMONARY A: ARDS and VDRF post CPR for 2 hours P:   - Full vent support -04/17/2017 down to 40% FiO2 and 5 of PEEP we will check arterial blood gas and assess for extubation today. -Check ABG for completeness  CARDIOVASCULAR A:  Circulatory Shock, unknown cause, resolving with resolution of acidosis HTN P:  -Off pressors -  On clevidipine drip suspect she will come off antihypertensive drip when she is extubated and pain is controlled. - EKG monitoring - Cards saw patient  RENAL Lab Results  Component Value Date   CREATININE 3.47 (H) 04/17/2017   CREATININE 3.41 (H) 04/17/2017   CREATININE 4.04 (H) 04/16/2017    A:   ESRD Metabolic acidosis P:   - Renal consult on 04/16/2017 - CRRT has been completed on 04/17/2017 -Blood pressure is adequate she can undergo normal hemodialysis.  GASTROINTESTINAL A:   Concern for ischemic bowel doubt with lactic acid 0.95 P:   - Abdominal CT currently on hold. - NPO - TF in AM suspect she will be extubated 04/17/2017 we will not need tube feeding  HEMATOLOGIC Recent Labs    04/16/17 2325 04/17/17 0451  HGB 8.2* 8.3*    A:   No active issues P:  - CBC - Transfuse per ICU protocol  INFECTIOUS A:   Concern for ischemic bowel or intraabdominal infection lactic acid was  .95  p:   - Levaquin - Flagyl -1121 blood cultures x2>>  ENDOCRINE CBG (last 3)  Recent Labs    04/16/17 2343 04/17/17 0316 04/17/17 0807  GLUCAP 122* 114* 114*    A:   DM   P:   - ISS - CBG - No basal insulin in medication list, will ask diabetic coordinator to see for basal insulin needs  NEUROLOGIC A:   Awake post code Awake and alert with no neurological deficits noted 04/17/2017 P:   RASS goal: 0 - Fentanyl as needed - Precedex drip - Avoid other sedating medications as able  FAMILY  - Updates: Family updated at bedside.  Patient awake and interactive despite being sedated and on mechanical ventilatory support.  - Inter-disciplinary family meet or Palliative Care meeting due by:  day 7  cct 30 min  Richardson Landry Minor ACNP Maryanna Shape PCCM Pager [phone] till 1 pm If no answer page [phone] 04/17/2017, 10:31 AM

## 2017-04-17 NOTE — Progress Notes (Addendum)
Pharmacy Antibiotic Note  [name] is a 41 y.o. female admitted on 04/16/2017 with intra-abdominal infection.  Pharmacy has been consulted for levofloxacin dosing. Pt is on CRRT with plans to transition to [facility] tomorrow or today if filter clots. Blood cultures pending, WBC wnl.  Plan: -Levofloxacin 500mg  IV q48h -Monitor renal funx and dialysis plans  Weight: 227 lb 4.7 oz (103.1 kg)  Temp (24hrs), Avg:96.4 F (35.8 C), Min:91.9 F (33.3 C), Max:98.4 F (36.9 C)  Recent Labs  Lab 04/15/17 1356 04/16/17 1822 04/16/17 2032 04/16/17 2325 04/17/17 0451  WBC 10.6* 14.7*  --  8.9 8.0  CREATININE 3.61*  --  4.04*  --  3.47*  3.41*    Estimated Creatinine Clearance: 30.5 mL/min (A) (by C-G formula based on SCr of 3.47 mg/dL (H)).    Allergies  Allergen Reactions  . Cephalexin Other (See Comments)    Reaction unknown- Childhood allergy Tolerated Ceftriaxone in the past  . Sulfamethoxazole-Trimethoprim Other (See Comments)    Unknown reaction. Pt states that she was told by her mother that she had allergy to Bactrim as a child.    Antimicrobials this admission: 11/21 Levofloxacin >>   Dose adjustments this admission: n/a  Microbiology results: 11/21 BCx: px  10/29 MRSA PCR: negative  Thank you for allowing pharmacy to be a part of this patient's care.  Arrie Senate, PharmD PGY-2 Cardiology Pharmacy Resident Pager: [phone] 04/17/2017

## 2017-04-17 NOTE — Progress Notes (Signed)
   VASCULAR SURGERY ASSESSMENT & PLAN:   1 Day Post-Op s/p: Right femoral endarterectomy with intraoperative cardiac arrest.  The patient is responsive.  She is sedated and on the vent.  CCM is following.  Hopefully she can be extubated today.  Brisk anterior tibial signal with the Doppler.  Her JP had significant drainage last night but this has slowed down and stopped essentially.  The drain had 470 cc total.  Dr. Trula Slade potentially plans the endovascular portion of the procedure on Friday.  SUBJECTIVE:   Sedated on vent.  PHYSICAL EXAM:   Vitals:   04/17/17 0600 04/17/17 0700 04/17/17 0800 04/17/17 0804  BP: (!) 159/74 (!) 162/74 (!) 162/97 (!) 162/97  Pulse: 67 68 72 73  Resp: 20 20 (!) 24 18  Temp: (!) 97.2 F (36.2 C) (!) 97.5 F (36.4 C) 98.1 F (36.7 C) 98.1 F (36.7 C)  TempSrc: Core (Comment) Core  Core  SpO2: 100% 100% 100% 99%  Weight:       Anterior tibial artery signal with Doppler. Foot appears adequately perfused.  LABS:   Lab Results  Component Value Date   WBC 8.0 04/17/2017   HGB 8.3 (L) 04/17/2017   HCT 26.7 (L) 04/17/2017   MCV 91.4 04/17/2017   PLT 197 04/17/2017   Lab Results  Component Value Date   CREATININE 3.47 (H) 04/17/2017   CREATININE 3.41 (H) 04/17/2017   Lab Results  Component Value Date   INR 1.14 04/15/2017   CBG (last 3)  Recent Labs    04/16/17 2008 04/16/17 2343 04/17/17 0316  GLUCAP 145* 122* 114*    PROBLEM LIST:    Active Problems:   PVD (peripheral vascular disease) ([place])   CURRENT MEDS:   . chlorhexidine gluconate (MEDLINE KIT)  15 mL Mouth Rinse BID  . heparin  5,000 Units Subcutaneous Q8H  . insulin aspart  2-6 Units Subcutaneous Q4H  . mouth rinse  15 mL Mouth Rinse QID  . pantoprazole (PROTONIX) IV  40 mg Intravenous QHS    Deitra Mayo Beeper: [phone] Office: [phone] 04/17/2017

## 2017-04-17 NOTE — Progress Notes (Signed)
Subjective:  Improved overnight- no longer on pressors- BP is high now- on hydralazine IV and cleviprex- UF added.  Bleeding from groin site  Objective Vital signs in last 24 hours: Vitals:   04/17/17 0453 04/17/17 0500 04/17/17 0600 04/17/17 0700  BP:  (!) 149/74 (!) 159/74 (!) 162/74  Pulse:  66 67 68  Resp:  _0 Temp:  (!) 97.2 F (36.2 C) (!) 97.2 F (36.2 C) (!) 97.5 F (36.4 C)  TempSrc:  Core Core (Comment) Core  SpO2:  100% 100% 100%  Weight: 103.1 kg (227 lb 4.7 oz)      Weight change:   Intake/Output Summary (Last 24 hours) at 04/17/2017 0721 Last data filed at 04/17/2017 0700 Gross per 24 hour  Intake 9000.35 ml  Output 2490 ml  Net 6510.35 ml    Dialyzes at [place] 89 (not getting there). MWF, 4 hours and 15 min HD Bath 2K/2.5 calc , Dialyzer opti 180, Heparin yes, 2000. Access left lower arm AVF. Profile #2-  hect 7 mircera 225 - got on 11/14 Last hgb 9.3, calc 8.7 , phos 9.0, pth 228  Also on sensipar 60 and ayryxia  Noted to be hypertensive as OP with 5 listed BP meds ?       Assessment/Plan: 41 year old WF with multiple medical issues "vasculopath" admitted for elective management of PAD - developed severe hypotension and metabolic acidosis in the OR- req CPR, pressors  1 PAD- almost completed invasive management of PAD per vascular- still in need of transmet amp 2. Acute decompensation- acute metabolic acidosis and hemodynamic collapse.  So far responded to maximal medical therapy in ICU- changes in abdomen suspicious for acute ischemic bowel ?  Was too unstable for imaging but then clinically improved. On vanc and flagyl  3 ESRD: normally MWF via AVF- got OP treatment on 11/20.  Right now needed CRRT for instability - improved- will tell nursing to stop CRRT if filter clots otherwise will stop in AM for procedures and be able to transition to IHD   3 Hypertension: usually very hypertensive- req pressors transiently - now on cleviprex- when extubated  resume PO antihypertensives and also increase UF with HD 4. Anemia of ESRD: hgb 8's- sounds like is on max ESA and most recent OP hgb was 9- cont supportive care and transfuse as needed - prob will drop with procedures  5. Metabolic Bone Disease: will resume sensipar and binders when eating- hold vit D for now as well       Nikita Surman A    Labs: Basic Metabolic Panel: Recent Labs  Lab 04/15/17 1356  04/16/17 1110  04/16/17 1310 04/16/17 2032 04/17/17 0451  NA 138   < > 136   < > 139 137 137  137  K 3.6   < > 3.8   < > 5.5* 3.7 3.9  3.9  CL 94*  --   --   --   --  99* 100*  101  CO2 31  --   --   --   --  _1 GLUCOSE 266*   < > 200*  --   --  151* 122*  122*  BUN 20  --   --   --   --  31* 26*  26*  CREATININE 3.61*  --   --   --   --  4.04* 3.47*  3.41*  CALCIUM 9.1  --   --   --   --  8.1* 8.4*  8.5*  PHOS  --   --   --   --   --  5.5* 4.6  4.5   < > = values in this interval not displayed.   Liver Function Tests: Recent Labs  Lab 04/15/17 1356 04/16/17 2032 04/17/17 0451  AST 20  --   --   ALT 15  --   --   ALKPHOS 449*  --   --   BILITOT 0.7  --   --   PROT 7.0  --   --   ALBUMIN 2.8* 2.1* 2.1*   No results for input(s): LIPASE, AMYLASE in the last 168 hours. No results for input(s): AMMONIA in the last 168 hours. CBC: Recent Labs  Lab 04/15/17 1356  04/16/17 1822 04/16/17 2325 04/17/17 0451  WBC 10.6*  --  14.7* 8.9 8.0  HGB 10.1*   < > 8.4* 8.2* 8.3*  HCT 32.4*   < > 26.6* 25.2* 26.7*  MCV 93.1  --  91.1 90.6 91.4  PLT 221  --  209 174 197   < > = values in this interval not displayed.   Cardiac Enzymes: No results for input(s): CKTOTAL, CKMB, CKMBINDEX, TROPONINI in the last 168 hours. CBG: Recent Labs  Lab 04/16/17 1505 04/16/17 1615 04/16/17 2008 04/16/17 2343 04/17/17 0316  GLUCAP 188* 170* 145* 122* 114*    Iron Studies: No results for input(s): IRON, TIBC, TRANSFERRIN, FERRITIN in the last 72  hours. Studies/Results: Dg Chest 1 View  Result Date: 04/16/2017 INDICATION: 41 year old female with acute cardiopulmonary arrest which occurred spontaneously during right femoral endarterectomy. There was some evidence of right ventricular dilatation by emergent trans esophageal echocardiography. Interventional Radiology was requested to perform emergent pulmonary arteriography to evaluate for acute pulmonary embolism as the cause of cardiac arrest. If pulmonary embolism is identified, thrombolysis may be performed. EXAM: IR HYBRID TRAUMA EMBOLIZATION; DG C-ARM 61-120 MIN; CHEST  1 VIEW COMPARISON:  None MEDICATIONS: None. ANESTHESIA/SEDATION: Patient currently under general anesthesia and being resuscitated by the anesthesiology service. FLUOROSCOPY TIME:  Fluoroscopy Time: 6 minutes 42 seconds COMPLICATIONS: None immediate. TECHNIQUE: Informed written consent was obtained from the patient after a thorough discussion of the procedural risks, benefits and alternatives. All questions were addressed. Maximal Sterile Barrier Technique was utilized including caps, mask, sterile gowns, sterile gloves, sterile drape, hand hygiene and skin antiseptic. A timeout was performed prior to the initiation of the procedure. A left common femoral venous access had already been obtained by Dr. Trula Slade. In between cycles of chest compressions, a 6 French MPA catheter was advanced over a Bentson wire into the right heart. Using a glidewire, the catheter was then advanced through the heart and into the main pulmonary artery. An arteriogram was performed. The main, central and lobar pulmonary arteries are widely patent. There is no evidence of filling defect to suggest a large volume pulmonary embolus. Therefore, no thrombolysis was performed. The catheter was removed. FINDINGS: The main, central and lobar pulmonary arteries are widely patent. There is no evidence of filling defect to suggest a large volume pulmonary embolus.  IMPRESSION: Pulmonary angiography is negative for large volume pulmonary embolus. Signed, Criselda Peaches, MD [facility] Electronically Signed   By: Jacqulynn Cadet M.D.   On: 04/16/2017 16:35   Dg Chest Port 1 View  Result Date: 04/16/2017 CLINICAL DATA:  Endotracheal tube placement. EXAM: PORTABLE CHEST 1 VIEW COMPARISON:  Radiographs of February 02, 2017. FINDINGS: Stable cardiomegaly  is noted. Endotracheal tube is noted projected over tracheal air shadow with distal tip 5 cm above the carina. Nasogastric tube is seen with distal tip at gastroesophageal junction. No pneumothorax is noted. Bilateral perihilar and basilar pulmonary edema is noted. Bony thorax is unremarkable. IMPRESSION: Endotracheal tube in grossly good position. Nasogastric tube tip appears to be in expected position of gastroesophageal junction. Probable bilateral pulmonary edema is noted. Electronically Signed   By: Marijo Conception, M.D.   On: 04/16/2017 14:52   Ir Hybrid Trauma Embolization  Result Date: 04/16/2017 INDICATION: 41 year old female with acute cardiopulmonary arrest which occurred spontaneously during right femoral endarterectomy. There was some evidence of right ventricular dilatation by emergent trans esophageal echocardiography. Interventional Radiology was requested to perform emergent pulmonary arteriography to evaluate for acute pulmonary embolism as the cause of cardiac arrest. If pulmonary embolism is identified, thrombolysis may be performed. EXAM: IR HYBRID TRAUMA EMBOLIZATION; DG C-ARM 61-120 MIN; CHEST  1 VIEW COMPARISON:  None MEDICATIONS: None. ANESTHESIA/SEDATION: Patient currently under general anesthesia and being resuscitated by the anesthesiology service. FLUOROSCOPY TIME:  Fluoroscopy Time: 6 minutes 42 seconds COMPLICATIONS: None immediate. TECHNIQUE: Informed written consent was obtained from the patient after a thorough discussion of  the procedural risks, benefits and alternatives. All questions were addressed. Maximal Sterile Barrier Technique was utilized including caps, mask, sterile gowns, sterile gloves, sterile drape, hand hygiene and skin antiseptic. A timeout was performed prior to the initiation of the procedure. A left common femoral venous access had already been obtained by Dr. Trula Slade. In between cycles of chest compressions, a 6 French MPA catheter was advanced over a Bentson wire into the right heart. Using a glidewire, the catheter was then advanced through the heart and into the main pulmonary artery. An arteriogram was performed. The main, central and lobar pulmonary arteries are widely patent. There is no evidence of filling defect to suggest a large volume pulmonary embolus. Therefore, no thrombolysis was performed. The catheter was removed. FINDINGS: The main, central and lobar pulmonary arteries are widely patent. There is no evidence of filling defect to suggest a large volume pulmonary embolus. IMPRESSION: Pulmonary angiography is negative for large volume pulmonary embolus. Signed, Criselda Peaches, MD [facility] Radiology Electronically Signed   By: Jacqulynn Cadet M.D.   On: 04/16/2017 16:35   Dg C-arm 1-60 Min  Result Date: 04/16/2017 INDICATION: 41 year old female with acute cardiopulmonary arrest which occurred spontaneously during right femoral endarterectomy. There was some evidence of right ventricular dilatation by emergent trans esophageal echocardiography. Interventional Radiology was requested to perform emergent pulmonary arteriography to evaluate for acute pulmonary embolism as the cause of cardiac arrest. If pulmonary embolism is identified, thrombolysis may be performed. EXAM: IR HYBRID TRAUMA EMBOLIZATION; DG C-ARM 61-120 MIN; CHEST  1 VIEW COMPARISON:  None MEDICATIONS: None. ANESTHESIA/SEDATION: Patient currently under general anesthesia and  being resuscitated by the anesthesiology service. FLUOROSCOPY TIME:  Fluoroscopy Time: 6 minutes 42 seconds COMPLICATIONS: None immediate. TECHNIQUE: Informed written consent was obtained from the patient after a thorough discussion of the procedural risks, benefits and alternatives. All questions were addressed. Maximal Sterile Barrier Technique was utilized including caps, mask, sterile gowns, sterile gloves, sterile drape, hand hygiene and skin antiseptic. A timeout was performed prior to the initiation of the procedure. A left common femoral venous access had already been obtained by Dr. Trula Slade. In between cycles of chest compressions, a 6 French MPA catheter was advanced over a Bentson wire into the right heart.  Using a glidewire, the catheter was then advanced through the heart and into the main pulmonary artery. An arteriogram was performed. The main, central and lobar pulmonary arteries are widely patent. There is no evidence of filling defect to suggest a large volume pulmonary embolus. Therefore, no thrombolysis was performed. The catheter was removed. FINDINGS: The main, central and lobar pulmonary arteries are widely patent. There is no evidence of filling defect to suggest a large volume pulmonary embolus. IMPRESSION: Pulmonary angiography is negative for large volume pulmonary embolus. Signed, Criselda Peaches, MD [facility] Radiology Electronically Signed   By: Jacqulynn Cadet M.D.   On: 04/16/2017 16:35   Medications: Infusions: . clevidipine 3 mg/hr (04/17/17 0600)  . dexmedetomidine (PRECEDEX) IV infusion 1.2 mcg/kg/hr (04/17/17 0442)  . epinephrine Stopped (04/16/17 1538)  . fentaNYL infusion INTRAVENOUS 350 mcg/hr (04/17/17 0548)  . levofloxacin (LEVAQUIN) IV Stopped (04/16/17 1845)  . metronidazole Stopped (04/17/17 0046)  . dialysis replacement fluid (prismasate) 500 mL/hr at 04/17/17 0402  . dialysis replacement fluid  (prismasate) 500 mL/hr at 04/17/17 0402  . dialysate (PRISMASATE) 1,500 mL/hr at 04/17/17 0410  . sodium chloride      Scheduled Medications: . chlorhexidine gluconate (MEDLINE KIT)  15 mL Mouth Rinse BID  . heparin  5,000 Units Subcutaneous Q8H  . insulin aspart  2-6 Units Subcutaneous Q4H  . mouth rinse  15 mL Mouth Rinse QID  . pantoprazole (PROTONIX) IV  40 mg Intravenous QHS    have reviewed scheduled and prn medications.  Physical Exam: General: sedated but moves all 4's when assessed Heart: RRR Lungs: mostly clear Abdomen: distended Extremities: no edema- ischemic changes Dialysis Access: left groin trialysis cath and left AVF patent     04/17/2017,7:21 AM  LOS: 1 day

## 2017-04-17 NOTE — Procedures (Signed)
Extubation Procedure Note  Patient Details:   Name: KIAIRA POINTER DOB: 1976/04/27 MRN: [id number]   Airway Documentation:     Evaluation  O2 sats: stable throughout Complications: No apparent complications Patient did tolerate procedure well. Bilateral Breath Sounds: Clear   Yes   Patient extubated to 6L nasal cannula.  Positive cuff leak noted.  No evidence of stridor.  Patient able to speak post extubation.  Sats currently 94%.  Vitals are stable.  No complications noted.   Alphia Moh N 04/17/2017, 11:15 AM

## 2017-04-18 ENCOUNTER — Inpatient Hospital Stay (HOSPITAL_COMMUNITY): Payer: Medicare Other

## 2017-04-18 ENCOUNTER — Encounter (HOSPITAL_COMMUNITY): Payer: Self-pay | Admitting: *Deleted

## 2017-04-18 DIAGNOSIS — I469 Cardiac arrest, cause unspecified: Secondary | ICD-10-CM

## 2017-04-18 LAB — RENAL FUNCTION PANEL
ALBUMIN: 2.3 g/dL — AB (ref 3.5–5.0)
ALBUMIN: 2.2 g/dL — AB (ref 3.5–5.0)
ALBUMIN: 2.3 g/dL — AB (ref 3.5–5.0)
ANION GAP: 13 (ref 5–15)
ANION GAP: 11 (ref 5–15)
Anion gap: 13 (ref 5–15)
BUN: 25 mg/dL — ABNORMAL HIGH (ref 6–20)
BUN: 31 mg/dL — ABNORMAL HIGH (ref 6–20)
BUN: 32 mg/dL — ABNORMAL HIGH (ref 6–20)
CALCIUM: 9.1 mg/dL (ref 8.9–10.3)
CALCIUM: 9.5 mg/dL (ref 8.9–10.3)
CO2: 25 mmol/L (ref 22–32)
CO2: 25 mmol/L (ref 22–32)
CO2: 27 mmol/L (ref 22–32)
CREATININE: 3.57 mg/dL — AB (ref 0.44–1.00)
CREATININE: 3.89 mg/dL — AB (ref 0.44–1.00)
Calcium: 9.6 mg/dL (ref 8.9–10.3)
Chloride: 100 mmol/L — ABNORMAL LOW (ref 101–111)
Chloride: 101 mmol/L (ref 101–111)
Chloride: 102 mmol/L (ref 101–111)
Creatinine, Ser: 3.68 mg/dL — ABNORMAL HIGH (ref 0.44–1.00)
GFR calc Af Amer: 17 mL/min — ABNORMAL LOW (ref >60)
GFR calc Af Amer: 17 mL/min — ABNORMAL LOW (ref >60)
GFR calc non Af Amer: 15 mL/min — ABNORMAL LOW (ref >60)
GFR calc non Af Amer: 13 mL/min — ABNORMAL LOW (ref >60)
GFR, EST AFRICAN AMERICAN: 15 mL/min — AB (ref >60)
GFR, EST NON AFRICAN AMERICAN: 14 mL/min — AB (ref >60)
GLUCOSE: 166 mg/dL — AB (ref 65–99)
GLUCOSE: 144 mg/dL — AB (ref 65–99)
Glucose, Bld: 186 mg/dL — ABNORMAL HIGH (ref 65–99)
PHOSPHORUS: 6.4 mg/dL — AB (ref 2.5–4.6)
PHOSPHORUS: 5.7 mg/dL — AB (ref 2.5–4.6)
PHOSPHORUS: 4.8 mg/dL — AB (ref 2.5–4.6)
POTASSIUM: 4.6 mmol/L (ref 3.5–5.1)
Potassium: 5.1 mmol/L (ref 3.5–5.1)
Potassium: 4.3 mmol/L (ref 3.5–5.1)
SODIUM: 138 mmol/L (ref 135–145)
SODIUM: 140 mmol/L (ref 135–145)
Sodium: 139 mmol/L (ref 135–145)

## 2017-04-18 LAB — BLOOD GAS, ARTERIAL
ACID-BASE EXCESS: 1.9 mmol/L (ref 0.0–2.0)
BICARBONATE: 25.1 mmol/L (ref 20.0–28.0)
Drawn by: 519031
FIO2: 100
LHR: 24 {breaths}/min
O2 Saturation: 98 %
PCO2 ART: 33.6 mmHg (ref 32.0–48.0)
PEEP: 10 cmH2O
PH ART: 7.486 — AB (ref 7.350–7.450)
Patient temperature: 98.6
VT: 660 mL
pO2, Arterial: 268 mmHg — ABNORMAL HIGH (ref 83.0–108.0)

## 2017-04-18 LAB — CBC WITH DIFFERENTIAL/PLATELET
BASOS ABS: 0 10*3/uL (ref 0.0–0.1)
BASOS PCT: 0 %
EOS ABS: 0.2 10*3/uL (ref 0.0–0.7)
Eosinophils Relative: 1 %
HEMATOCRIT: 27.9 % — AB (ref 36.0–46.0)
HEMOGLOBIN: 8.4 g/dL — AB (ref 12.0–15.0)
Lymphocytes Relative: 10 %
Lymphs Abs: 1.2 10*3/uL (ref 0.7–4.0)
MCH: 28.6 pg (ref 26.0–34.0)
MCHC: 30.1 g/dL (ref 30.0–36.0)
MCV: 94.9 fL (ref 78.0–100.0)
MONOS PCT: 12 %
Monocytes Absolute: 1.4 10*3/uL — ABNORMAL HIGH (ref 0.1–1.0)
NEUTROS ABS: 9.1 10*3/uL — AB (ref 1.7–7.7)
NEUTROS PCT: 77 %
Platelets: 238 10*3/uL (ref 150–400)
RBC: 2.94 MIL/uL — ABNORMAL LOW (ref 3.87–5.11)
RDW: 17.8 % — AB (ref 11.5–15.5)
WBC: 11.9 10*3/uL — AB (ref 4.0–10.5)

## 2017-04-18 LAB — COMPREHENSIVE METABOLIC PANEL
ALK PHOS: 281 U/L — AB (ref 38–126)
ALT: 140 U/L — ABNORMAL HIGH (ref 14–54)
ANION GAP: 18 — AB (ref 5–15)
AST: 290 U/L — ABNORMAL HIGH (ref 15–41)
Albumin: 2.2 g/dL — ABNORMAL LOW (ref 3.5–5.0)
BILIRUBIN TOTAL: 1.5 mg/dL — AB (ref 0.3–1.2)
BUN: 24 mg/dL — ABNORMAL HIGH (ref 6–20)
CALCIUM: 10.8 mg/dL — AB (ref 8.9–10.3)
CO2: 22 mmol/L (ref 22–32)
Chloride: 101 mmol/L (ref 101–111)
Creatinine, Ser: 3.54 mg/dL — ABNORMAL HIGH (ref 0.44–1.00)
GFR, EST AFRICAN AMERICAN: 17 mL/min — AB (ref >60)
GFR, EST NON AFRICAN AMERICAN: 15 mL/min — AB (ref >60)
Glucose, Bld: 168 mg/dL — ABNORMAL HIGH (ref 65–99)
POTASSIUM: 5.8 mmol/L — AB (ref 3.5–5.1)
Sodium: 141 mmol/L (ref 135–145)
TOTAL PROTEIN: 5.8 g/dL — AB (ref 6.5–8.1)

## 2017-04-18 LAB — GLUCOSE, CAPILLARY
GLUCOSE-CAPILLARY: 101 mg/dL — AB (ref 65–99)
Glucose-Capillary: 135 mg/dL — ABNORMAL HIGH (ref 65–99)
Glucose-Capillary: 144 mg/dL — ABNORMAL HIGH (ref 65–99)
Glucose-Capillary: 180 mg/dL — ABNORMAL HIGH (ref 65–99)
Glucose-Capillary: 178 mg/dL — ABNORMAL HIGH (ref 65–99)
Glucose-Capillary: 84 mg/dL (ref 65–99)
Glucose-Capillary: 87 mg/dL (ref 65–99)

## 2017-04-18 LAB — CBC
HCT: 32 % — ABNORMAL LOW (ref 36.0–46.0)
Hemoglobin: 9.3 g/dL — ABNORMAL LOW (ref 12.0–15.0)
MCH: 28.6 pg (ref 26.0–34.0)
MCHC: 29.1 g/dL — ABNORMAL LOW (ref 30.0–36.0)
MCV: 98.5 fL (ref 78.0–100.0)
PLATELETS: 239 10*3/uL (ref 150–400)
RBC: 3.25 MIL/uL — ABNORMAL LOW (ref 3.87–5.11)
RDW: 18 % — AB (ref 11.5–15.5)
WBC: 16.9 10*3/uL — ABNORMAL HIGH (ref 4.0–10.5)

## 2017-04-18 LAB — LACTIC ACID, PLASMA
LACTIC ACID, VENOUS: 9.2 mmol/L — AB (ref 0.5–1.9)
Lactic Acid, Venous: 1 mmol/L (ref 0.5–1.9)

## 2017-04-18 LAB — POCT I-STAT 3, ART BLOOD GAS (G3+)
ACID-BASE DEFICIT: 8 mmol/L — AB (ref 0.0–2.0)
BICARBONATE: 20 mmol/L (ref 20.0–28.0)
O2 SAT: 59 %
PH ART: 7.207 — AB (ref 7.350–7.450)
TCO2: 22 mmol/L (ref 22–32)
pCO2 arterial: 50.1 mmHg — ABNORMAL HIGH (ref 32.0–48.0)
pO2, Arterial: 37 mmHg — CL (ref 83.0–108.0)

## 2017-04-18 LAB — MAGNESIUM
MAGNESIUM: 2.4 mg/dL (ref 1.7–2.4)
MAGNESIUM: 2.7 mg/dL — AB (ref 1.7–2.4)
Magnesium: 2.2 mg/dL (ref 1.7–2.4)

## 2017-04-18 LAB — TROPONIN I: TROPONIN I: 0.67 ng/mL — AB (ref <0.03)

## 2017-04-18 LAB — ECHOCARDIOGRAM COMPLETE: Weight: 3410.96 oz

## 2017-04-18 LAB — PROTIME-INR
INR: 1.31
PROTHROMBIN TIME: 16.2 s — AB (ref 11.4–15.2)

## 2017-04-18 LAB — PHOSPHORUS: PHOSPHORUS: 9 mg/dL — AB (ref 2.5–4.6)

## 2017-04-18 MED ORDER — IPRATROPIUM-ALBUTEROL 0.5-2.5 (3) MG/3ML IN SOLN
3.0000 mL | RESPIRATORY_TRACT | Status: DC | PRN
Start: 2017-04-18 — End: 2017-04-28
  Administered 2017-04-18 – 2017-04-27 (×13): 3 mL via RESPIRATORY_TRACT
  Filled 2017-04-18 (×14): qty 3

## 2017-04-18 MED ORDER — SODIUM CHLORIDE 0.9% FLUSH
10.0000 mL | Freq: Two times a day (BID) | INTRAVENOUS | Status: DC
  Administered 2017-04-18 – 2017-04-20 (×5): 10 mL
  Administered 2017-04-22: 30 mL
  Administered 2017-04-23 (×2): 10 mL

## 2017-04-18 MED ORDER — CHLORHEXIDINE GLUCONATE 0.12% ORAL RINSE (MEDLINE KIT)
15.0000 mL | Freq: Two times a day (BID) | OROMUCOSAL | Status: DC
  Administered 2017-04-18 – 2017-04-19 (×3): 15 mL via OROMUCOSAL

## 2017-04-18 MED ORDER — CHLORHEXIDINE GLUCONATE CLOTH 2 % EX PADS
6.0000 | MEDICATED_PAD | Freq: Every day | CUTANEOUS | Status: DC
  Administered 2017-04-18 – 2017-04-23 (×5): 6 via TOPICAL

## 2017-04-18 MED ORDER — FENTANYL 2500MCG IN NS 250ML (10MCG/ML) PREMIX INFUSION
0.0000 ug/h | INTRAVENOUS | Status: DC
  Administered 2017-04-18: 175 ug/h via INTRAVENOUS
  Administered 2017-04-18: 50 ug/h via INTRAVENOUS
  Administered 2017-04-19 – 2017-04-20 (×3): 200 ug/h via INTRAVENOUS
  Administered 2017-04-20: 275 ug/h via INTRAVENOUS
  Administered 2017-04-21 (×2): 325 ug/h via INTRAVENOUS
  Administered 2017-04-21: 300 ug/h via INTRAVENOUS
  Administered 2017-04-22: 400 ug/h via INTRAVENOUS
  Administered 2017-04-22 – 2017-04-23 (×3): 350 ug/h via INTRAVENOUS
  Administered 2017-04-24: 250 ug/h via INTRAVENOUS
  Administered 2017-04-24: 350 ug/h via INTRAVENOUS
  Administered 2017-04-24 – 2017-04-25 (×2): 250 ug/h via INTRAVENOUS
  Administered 2017-04-25: 200 ug/h via INTRAVENOUS
  Administered 2017-04-26 (×2): 50 ug/h via INTRAVENOUS
  Administered 2017-04-26: 350 ug/h via INTRAVENOUS
  Administered 2017-04-26 – 2017-04-27 (×7): 100 ug/h via INTRAVENOUS
  Administered 2017-04-27: 75 ug/h via INTRAVENOUS
  Administered 2017-04-27: 50 ug/h via INTRAVENOUS
  Administered 2017-04-28: 125 ug/h via INTRAVENOUS
  Administered 2017-04-28 (×2): 100 ug/h via INTRAVENOUS
  Filled 2017-04-18 (×25): qty 250

## 2017-04-18 MED ORDER — ORAL CARE MOUTH RINSE
15.0000 mL | Freq: Four times a day (QID) | OROMUCOSAL | Status: DC
  Administered 2017-04-18 – 2017-04-19 (×6): 15 mL via OROMUCOSAL

## 2017-04-18 MED ORDER — IOPAMIDOL (ISOVUE-370) INJECTION 76%
INTRAVENOUS | Status: AC
  Administered 2017-04-18: 100 mL
  Filled 2017-04-18: qty 100

## 2017-04-18 MED ORDER — CLEVIDIPINE BUTYRATE 0.5 MG/ML IV EMUL
0.0000 mg/h | INTRAVENOUS | Status: DC
  Administered 2017-04-18: 4 mg/h via INTRAVENOUS
  Administered 2017-04-18: 7 mg/h via INTRAVENOUS
  Administered 2017-04-18: 1 mg/h via INTRAVENOUS
  Administered 2017-04-19: 11 mg/h via INTRAVENOUS
  Administered 2017-04-19: 8 mg/h via INTRAVENOUS
  Administered 2017-04-19: 11 mg/h via INTRAVENOUS
  Administered 2017-04-19: 8 mg/h via INTRAVENOUS
  Administered 2017-04-19: 10 mg/h via INTRAVENOUS
  Administered 2017-04-20: 17 mg/h via INTRAVENOUS
  Administered 2017-04-20: 15 mg/h via INTRAVENOUS
  Administered 2017-04-20: 20 mg/h via INTRAVENOUS
  Administered 2017-04-20: 17 mg/h via INTRAVENOUS
  Administered 2017-04-20: 15 mg/h via INTRAVENOUS
  Administered 2017-04-20: 18 mg/h via INTRAVENOUS
  Administered 2017-04-20 – 2017-04-21 (×11): 20 mg/h via INTRAVENOUS
  Administered 2017-04-22: 21 mg/h via INTRAVENOUS
  Administered 2017-04-22: 15 mg/h via INTRAVENOUS
  Administered 2017-04-22: 20 mg/h via INTRAVENOUS
  Administered 2017-04-22: 21 mg/h via INTRAVENOUS
  Administered 2017-04-22 (×2): 20 mg/h via INTRAVENOUS
  Administered 2017-04-22 (×3): 21 mg/h via INTRAVENOUS
  Administered 2017-04-23: 17 mg/h via INTRAVENOUS
  Administered 2017-04-23: 15 mg/h via INTRAVENOUS
  Administered 2017-04-23: 21 mg/h via INTRAVENOUS
  Administered 2017-04-23: 14 mg/h via INTRAVENOUS
  Administered 2017-04-23 (×3): 21 mg/h via INTRAVENOUS
  Administered 2017-04-24 (×3): 16 mg/h via INTRAVENOUS
  Filled 2017-04-18 (×34): qty 100
  Filled 2017-04-18: qty 50
  Filled 2017-04-18 (×3): qty 100
  Filled 2017-04-18: qty 50
  Filled 2017-04-18 (×11): qty 100
  Filled 2017-04-18: qty 50
  Filled 2017-04-18 (×2): qty 100

## 2017-04-18 MED ORDER — MORPHINE SULFATE (PF) 4 MG/ML IV SOLN
4.0000 mg | INTRAVENOUS | Status: DC | PRN
  Administered 2017-04-18 (×4): 4 mg via INTRAVENOUS
  Filled 2017-04-18 (×4): qty 1

## 2017-04-18 MED ORDER — FENTANYL CITRATE (PF) 100 MCG/2ML IJ SOLN
50.0000 ug | INTRAMUSCULAR | Status: DC | PRN
  Administered 2017-04-22: 50 ug via INTRAVENOUS

## 2017-04-18 MED ORDER — SODIUM CHLORIDE 0.9% FLUSH: 10.0000 mL | INTRAVENOUS | Status: DC | PRN

## 2017-04-18 NOTE — Progress Notes (Signed)
Subjective:  Was doing well yesterday- extubated - talking- on CRRT until filter clotted late last night.  K was 5.1 this AM- then had PEA arrest, required re intubation and code- lactate 9 and K of 5.8 but this was pericode.  BP is back up to being high  Objective Vital signs in last 24 hours: Vitals:   04/18/17 0630 04/18/17 0646 04/18/17 0725 04/18/17 0735  BP: (!) 243/105 (!) 234/109  (!) 194/67  Pulse: (!) 38   98  Resp: (!) 35 (!) 24  (!) 24  Temp:      TempSrc:      SpO2: 95% 100% 100% 100%  Weight:       Weight change: -5.6 kg (-5.5 oz)  Intake/Output Summary (Last 24 hours) at 04/18/2017 0825 Last data filed at 04/18/2017 0500 Gross per 24 hour  Intake 391.7 ml  Output 3228 ml  Net -2836.3 ml    Dialyzes at [place] [address] (not getting there). MWF, 4 hours and 15 min HD Bath 2K/2.5 calc , Dialyzer opti 180, Heparin yes, 2000. Access left lower arm AVF. Profile #2-  hect 7 mircera 225 - got on 11/14 Last hgb 9.3, calc 8.7 , phos 9.0, pth 228  Also on sensipar 60 and ayryxia  Noted to be hypertensive as OP with 5 listed BP meds ?       Assessment/Plan: 41 year old WF with multiple medical issues "vasculopath" admitted for elective management of PAD - developed severe hypotension and metabolic acidosis in the OR- req CPR, pressors  1 PAD- almost completed invasive management of PAD per vascular- still in need of transmet amp 2. Acute decompensation- now times 2- in OR and this AM- acute metabolic acidosis and hemodynamic collapse.  So far responded to maximal medical therapy in ICU-   Was too unstable for imaging but then clinically improved. On vanc and flagyl - now levaquin.  Episode this AM ? PE- very elevated lactate- code and chronically ischemic limb ?  3 ESRD: normally MWF via AVF- got OP treatment on 11/20.  Req CRRT for instability here- was on it from Wednesday after noon to 10:30 PM last night so should have been adequately dialyzed-  Will check labs here in about an  hour- if there are HD needs may try IHD vs crrt depending on clinical scenario 3 Hypertension: usually very hypertensive- req pressors transiently -now on cleviprex early yest until able to take PO meds- now hypertensive again after code and epi 4. Anemia of ESRD: hgb 8's- sounds like is on max ESA and most recent OP hgb was 9- cont supportive care and transfuse as needed - prob will drop with procedures  5. Metabolic Bone Disease: will resume sensipar and binders when eating- hold vit D for now as well       [name] A    Labs: Basic Metabolic Panel: Recent Labs  Lab 04/17/17 1604 04/18/17 0445 04/18/17 0635  NA 137 138 141  K 4.2 5.1 5.8*  CL 100* 100* 101  CO2 _0 GLUCOSE 123* 166* 168*  BUN 19 25* 24*  CREATININE 2.86* 3.57* 3.54*  CALCIUM 8.6* 9.1 10.8*  PHOS 4.2 6.4* 9.0*   Liver Function Tests: Recent Labs  Lab 04/15/17 1356  04/17/17 1604 04/18/17 0445 04/18/17 0635  AST 20  --   --   --  290*  ALT 15  --   --   --  140*  ALKPHOS 449*  --   --   --  281*  BILITOT 0.7  --   --   --  1.5*  PROT 7.0  --   --   --  5.8*  ALBUMIN 2.8*   < > 2.3* 2.3* 2.2*   < > = values in this interval not displayed.   No results for input(s): LIPASE, AMYLASE in the last 168 hours. No results for input(s): AMMONIA in the last 168 hours. CBC: Recent Labs  Lab 04/16/17 1822 04/16/17 2325 04/17/17 0451 04/18/17 0445 04/18/17 0635  WBC 14.7* 8.9 8.0 11.9* 16.9*  NEUTROABS  --   --   --  9.1*  --   HGB 8.4* 8.2* 8.3* 8.4* 9.3*  HCT 26.6* 25.2* 26.7* 27.9* 32.0*  MCV 91.1 90.6 91.4 94.9 98.5  PLT 209 174 197 238 239   Cardiac Enzymes: Recent Labs  Lab 04/18/17 0635  TROPONINI 0.67*   CBG: Recent Labs  Lab 04/17/17 2117 04/17/17 2231 04/17/17 2337 04/18/17 0407 04/18/17 0602  GLUCAP 117* 122* 117* 135* 144*    Iron Studies: No results for input(s): IRON, TIBC, TRANSFERRIN, FERRITIN in the last 72 hours. Studies/Results: Dg Chest 1  View  Result Date: 04/16/2017 INDICATION: 41 year old female with acute cardiopulmonary arrest which occurred spontaneously during right femoral endarterectomy. There was some evidence of right ventricular dilatation by emergent trans esophageal echocardiography. Interventional Radiology was requested to perform emergent pulmonary arteriography to evaluate for acute pulmonary embolism as the cause of cardiac arrest. If pulmonary embolism is identified, thrombolysis may be performed. EXAM: IR HYBRID TRAUMA EMBOLIZATION; DG C-ARM 61-120 MIN; CHEST  1 VIEW COMPARISON:  None MEDICATIONS: None. ANESTHESIA/SEDATION: Patient currently under general anesthesia and being resuscitated by the anesthesiology service. FLUOROSCOPY TIME:  Fluoroscopy Time: 6 minutes 42 seconds COMPLICATIONS: None immediate. TECHNIQUE: Informed written consent was obtained from the patient after a thorough discussion of the procedural risks, benefits and alternatives. All questions were addressed. Maximal Sterile Barrier Technique was utilized including caps, mask, sterile gowns, sterile gloves, sterile drape, hand hygiene and skin antiseptic. A timeout was performed prior to the initiation of the procedure. A left common femoral venous access had already been obtained by Dr. Trula Slade. In between cycles of chest compressions, a 6 French MPA catheter was advanced over a Bentson wire into the right heart. Using a glidewire, the catheter was then advanced through the heart and into the main pulmonary artery. An arteriogram was performed. The main, central and lobar pulmonary arteries are widely patent. There is no evidence of filling defect to suggest a large volume pulmonary embolus. Therefore, no thrombolysis was performed. The catheter was removed. FINDINGS: The main, central and lobar pulmonary arteries are widely patent. There is no evidence of filling defect to suggest a large volume pulmonary embolus. IMPRESSION: Pulmonary angiography is  negative for large volume pulmonary embolus. Signed, Criselda Peaches, MD [facility] Radiology Electronically Signed   By: Jacqulynn Cadet M.D.   On: 04/16/2017 16:35   Dg Chest Port 1 View  Result Date: 04/18/2017 CLINICAL DATA:  41 year old female status post cardiac arrest. 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 EXAM: PORTABLE CHEST 1 VIEW COMPARISON:  04/17/2017 and earlier. FINDINGS: Portable AP supine view at 0643 hours. Endotracheal tube tip in good position at the level the clavicles. Enteric tube removed. Other curvilinear objects projecting over the mediastinum and central diaphragm are presumed to be external. Pacer/resuscitation pads are in place. Stable cardiomegaly and  mediastinal contours. Continued increased bilateral interstitial opacity, mildly progressed since yesterday. Stable more confluent infrahilar opacity. No pleural effusion or pneumothorax. No consolidation. IMPRESSION: 1. Increased interstitial edema since yesterday. Stable cardiomegaly. No consolidation or pleural effusion is evident. 2. Enteric tube removed.  ET tube in good position. Electronically Signed   By: Genevie Ann M.D.   On: 04/18/2017 07:07   Dg Chest Port 1 View  Result Date: 04/17/2017 CLINICAL DATA:  Hypoxia EXAM: PORTABLE CHEST 1 VIEW COMPARISON:  April 16, 2017 FINDINGS: Endotracheal tube tip is 6.0 cm above the carina. Nasogastric tube tip and side port are below the diaphragm. No pneumothorax. There is a small right pleural effusion. No edema or consolidation. There is cardiomegaly with pulmonary vascularity within normal limits. No adenopathy. There is aortic atherosclerosis. No evident bone lesions. IMPRESSION: Tube positions as described without pneumothorax. Stable cardiomegaly. Small right pleural effusion. No edema or consolidation. There is aortic  atherosclerosis. Aortic Atherosclerosis (ICD10-I70.0). Electronically Signed   By: Lowella Grip III M.D.   On: 04/17/2017 09:13   Dg Chest Port 1 View  Result Date: 04/16/2017 CLINICAL DATA:  Endotracheal tube placement. EXAM: PORTABLE CHEST 1 VIEW COMPARISON:  Radiographs of February 02, 2017. FINDINGS: Stable cardiomegaly is noted. Endotracheal tube is noted projected over tracheal air shadow with distal tip 5 cm above the carina. Nasogastric tube is seen with distal tip at gastroesophageal junction. No pneumothorax is noted. Bilateral perihilar and basilar pulmonary edema is noted. Bony thorax is unremarkable. IMPRESSION: Endotracheal tube in grossly good position. Nasogastric tube tip appears to be in expected position of gastroesophageal junction. Probable bilateral pulmonary edema is noted. Electronically Signed   By: Marijo Conception, M.D.   On: 04/16/2017 14:52   Ir Hybrid Trauma Embolization  Result Date: 04/16/2017 INDICATION: 41 year old female with acute cardiopulmonary arrest which occurred spontaneously during right femoral endarterectomy. There was some evidence of right ventricular dilatation by emergent trans esophageal echocardiography. Interventional Radiology was requested to perform emergent pulmonary arteriography to evaluate for acute pulmonary embolism as the cause of cardiac arrest. If pulmonary embolism is identified, thrombolysis may be performed. EXAM: IR HYBRID TRAUMA EMBOLIZATION; DG C-ARM 61-120 MIN; CHEST  1 VIEW COMPARISON:  None MEDICATIONS: None. ANESTHESIA/SEDATION: Patient currently under general anesthesia and being resuscitated by the anesthesiology service. FLUOROSCOPY TIME:  Fluoroscopy Time: 6 minutes 42 seconds COMPLICATIONS: None immediate. TECHNIQUE: Informed written consent was obtained from the patient after a thorough discussion of the procedural risks, benefits and alternatives. All questions were addressed. Maximal Sterile Barrier Technique was utilized  including caps, mask, sterile gowns, sterile gloves, sterile drape, hand hygiene and skin antiseptic. A timeout was performed prior to the initiation of the procedure. A left common femoral venous access had already been obtained by Dr. Trula Slade. In between cycles of chest compressions, a 6 French MPA catheter was advanced over a Bentson wire into the right heart. Using a glidewire, the catheter was then advanced through the heart and into the main pulmonary artery. An arteriogram was performed. The main, central and lobar pulmonary arteries are widely patent. There is no evidence of filling defect to suggest a large volume pulmonary embolus. Therefore, no thrombolysis was performed. The catheter was removed. FINDINGS: The main, central and lobar pulmonary arteries are widely patent. There is no evidence of filling defect to suggest a large volume pulmonary embolus. IMPRESSION: Pulmonary angiography is negative for large volume pulmonary embolus. Signed, Criselda Peaches, MD [facility] Radiology Electronically Signed  By: Jacqulynn Cadet M.D.   On: 04/16/2017 16:35   Dg C-arm 1-60 Min  Result Date: 04/16/2017 INDICATION: 41 year old female with acute cardiopulmonary arrest which occurred spontaneously during right femoral endarterectomy. There was some evidence of right ventricular dilatation by emergent trans esophageal echocardiography. Interventional Radiology was requested to perform emergent pulmonary arteriography to evaluate for acute pulmonary embolism as the cause of cardiac arrest. If pulmonary embolism is identified, thrombolysis may be performed. EXAM: IR HYBRID TRAUMA EMBOLIZATION; DG C-ARM 61-120 MIN; CHEST  1 VIEW COMPARISON:  None MEDICATIONS: None. ANESTHESIA/SEDATION: Patient currently under general anesthesia and being resuscitated by the anesthesiology service. FLUOROSCOPY TIME:  Fluoroscopy Time: 6 minutes 42 seconds COMPLICATIONS: None  immediate. TECHNIQUE: Informed written consent was obtained from the patient after a thorough discussion of the procedural risks, benefits and alternatives. All questions were addressed. Maximal Sterile Barrier Technique was utilized including caps, mask, sterile gowns, sterile gloves, sterile drape, hand hygiene and skin antiseptic. A timeout was performed prior to the initiation of the procedure. A left common femoral venous access had already been obtained by Dr. Trula Slade. In between cycles of chest compressions, a 6 French MPA catheter was advanced over a Bentson wire into the right heart. Using a glidewire, the catheter was then advanced through the heart and into the main pulmonary artery. An arteriogram was performed. The main, central and lobar pulmonary arteries are widely patent. There is no evidence of filling defect to suggest a large volume pulmonary embolus. Therefore, no thrombolysis was performed. The catheter was removed. FINDINGS: The main, central and lobar pulmonary arteries are widely patent. There is no evidence of filling defect to suggest a large volume pulmonary embolus. IMPRESSION: Pulmonary angiography is negative for large volume pulmonary embolus. Signed, Criselda Peaches, MD [facility] Radiology Electronically Signed   By: Jacqulynn Cadet M.D.   On: 04/16/2017 16:35   Medications: Infusions: . levofloxacin (LEVAQUIN) IV    . metronidazole Stopped (04/18/17 0037)  . dialysis replacement fluid (prismasate) 500 mL/hr at 04/17/17 1407  . dialysis replacement fluid (prismasate) 500 mL/hr at 04/17/17 1411  . dialysate (PRISMASATE) 1,500 mL/hr at 04/17/17 1746  . sodium chloride      Scheduled Medications: . amLODipine  10 mg Oral Daily  . atorvastatin  40 mg Oral q1800  . chlorhexidine gluconate (MEDLINE KIT)  15 mL Mouth Rinse BID  . Chlorhexidine Gluconate Cloth  6 each Topical Daily  . cloNIDine  0.3 mg Oral TID  .  heparin  5,000 Units Subcutaneous Q8H  . insulin aspart  2-6 Units Subcutaneous Q4H  . mouth rinse  15 mL Mouth Rinse BID  . mouth rinse  15 mL Mouth Rinse QID  . sodium chloride flush  10-40 mL Intracatheter Q12H    have reviewed scheduled and prn medications.  Physical Exam: General: wide eyed- pupils dilated from atropine- family at bedside- she follows commands Heart: RRR Lungs: mostly clear Abdomen: distended Extremities: no edema- ischemic changes Dialysis Access: left groin trialysis cath and left AVF patent     04/18/2017,8:25 AM  LOS: 2 days

## 2017-04-18 NOTE — Procedures (Signed)
Arterial Catheter Insertion Procedure Note [name] [id number] 1976-01-23  Procedure: Insertion of Arterial Catheter  Indications: Blood pressure monitoring and Frequent blood sampling  Procedure Details Consent: Unable to obtain consent because of emergent medical necessity. Time Out: Verified patient identification, verified procedure, site/side was marked, verified correct patient position, special equipment/implants available, medications/allergies/relevent history reviewed, required imaging and test results available.  Performed  Maximum sterile technique was used including antiseptics, gloves, gown, hand hygiene, mask and sheet. Skin prep: Chlorhexidine; local anesthetic administered 20 gauge catheter was inserted into right radial artery using the Seldinger technique.  Evaluation Blood flow good; BP tracing good. Complications: No apparent complications.   Kennieth Rad, AGACNP-BC [place] [facility] Pgr: [phone] or if no answer [phone] 04/18/2017, 6:51 AM

## 2017-04-18 NOTE — Progress Notes (Signed)
  Echocardiogram 2D Echocardiogram has been performed.  [name] 04/18/2017, 9:36 AM

## 2017-04-18 NOTE — Progress Notes (Signed)
Called to room by nurse. A-line not showing waveform and unable to flush line. Removed dressing to inspect catheter. Replaced with new dressing. Line setup changed. Waveform now showing and line flushes well. Family also requested that ETT holder was changed because they didn't like the way it was sitting on her face. ETT holder changed at this time.

## 2017-04-18 NOTE — Significant Event (Signed)
PATIENT NAME: [name] RECORD NUMBER: [id number] Birthday: 12-16-1975  Age: 41 y.o. Admit Date: 04/16/2017  Provider: Dr. Elsworth Soho  Indication: PEA arrest  Technical Description:   CPR performance duration: 0600 to 0631  Was defibrillation or cardioversion used ? no  Was external pacer placed ? yes  Was patient intubated pre/post CPR ? Intubated at [place]  Was transvenous pacer placed ? no  Medications Administered Include      Yes/no Amiodarone   Atropine   X 4  Calcium   X 2   Epinephrine   X 4 then gtt  Lidocaine   Magnesium   Norepinephrine   gtt  Phenylephrine   Sodium bicarbonate   X 4   Vasopression    Evaluation  Final Status - Was patient successfully resuscitated ? yes  If successfully resuscitated - what is current rhythm ? ST If successfully resuscitated - what is current hemodynamic status ? Unstable   Kennieth Rad, AGACNP-BC [place] [facility] Pgr: [phone] or if no answer [phone] 04/18/2017, 6:57 AM

## 2017-04-18 NOTE — Progress Notes (Signed)
    Subjective  - POD #3  Events of this am noted   Physical Exam:  Patient now intubated.  She does open her eyes and follow commands JP in right groin with minimal out put Both feet warm with doppler signals       Assessment/Plan:    Will likely take JP out tomorrow No current plans for additional attempts at revascularization.  Still needs TMA which could be done under a block.  This is not emergent and not the reason for her hemodynamic collapse Appreciate assistance from all services involved Family updated at bedside  [place] 04/18/2017 1:12 PM --  Vitals:   04/18/17 1100 04/18/17 1156  BP: (!) 196/89   Pulse: 90 88  Resp: (!) 21 (!) 21  Temp:  98.8 F (37.1 C)  SpO2: 100% 100%    Intake/Output Summary (Last 24 hours) at 04/18/2017 1312 Last data filed at 04/18/2017 1200 Gross per 24 hour  Intake 386.58 ml  Output 1759 ml  Net -1372.42 ml     Laboratory CBC    Component Value Date/Time   WBC 16.9 (H) 04/18/2017 0635   HGB 9.3 (L) 04/18/2017 0635   HCT 32.0 (L) 04/18/2017 0635   PLT 239 04/18/2017 0635    BMET    Component Value Date/Time   NA 139 04/18/2017 1000   K 4.6 04/18/2017 1000   CL 101 04/18/2017 1000   CO2 25 04/18/2017 1000   GLUCOSE 186 (H) 04/18/2017 1000   BUN 31 (H) 04/18/2017 1000   CREATININE 3.68 (H) 04/18/2017 1000   CALCIUM 9.6 04/18/2017 1000   GFRNONAA 14 (L) 04/18/2017 1000   GFRAA 17 (L) 04/18/2017 1000    COAG Lab Results  Component Value Date   INR 1.31 04/18/2017   INR 1.14 04/15/2017   INR 1.13 03/27/2017   No results found for: PTT  Antibiotics Anti-infectives (From admission, onward)   Start     Dose/Rate Route Frequency Ordered Stop   04/18/17 1700  levofloxacin (LEVAQUIN) IVPB 500 mg     500 mg 100 mL/hr over 60 Minutes Intravenous Every 48 hours 04/17/17 0933     04/16/17 1700  levofloxacin (LEVAQUIN) IVPB 750 mg  Status:  Discontinued     750 mg 100 mL/hr over 90 Minutes  Intravenous Every 48 hours 04/16/17 1544 04/17/17 0933   04/16/17 1530  metroNIDAZOLE (FLAGYL) IVPB 500 mg     500 mg 100 mL/hr over 60 Minutes Intravenous Every 8 hours 04/16/17 1512     04/16/17 0636  vancomycin (VANCOCIN) 1-5 GM/200ML-% IVPB    Comments:  Gallman, Kathie   : cabinet override      04/16/17 0636 04/16/17 0820   04/16/17 0633  vancomycin (VANCOCIN) IVPB 1000 mg/200 mL premix     1,000 mg 200 mL/hr over 60 Minutes Intravenous 60 min pre-op 04/16/17 0633 04/16/17 0920       V. Leia Alf, M.D. [facility] [place] Office: [phone] Pager:  [phone]

## 2017-04-18 NOTE — Progress Notes (Addendum)
Called emergently for code situation. Arrived to find her being bagged, transcutaneously paced, atropine given already For family suddenly called out and may have had some seizure-like activity, blood pressure running 100 range before, K 5.1  She was unresponsive, based on 2 blood pressure obtain and then emergently intubated without medications on first attempt. She subsequently lost pulse and required CPR for 10 minutes, with multiple rounds of epinephrine, bicarb and calcium. Was paced during the entire duration , but subsequently hypertensive and regained rhythm, pacer slowly turned down and turned off and has intrinsic tachycardic rhythm.  Chest x-ray shows ET tube high and will push down 2 cm Ventilator settings adjusted based on ABG Family updated at bedside.  Cause unclear-she is hypertensive at baseline and perhaps we should let blood pressure run high 160-180 range  Total critical care time x 58 minutes  Aireal Slater V. Elsworth Soho MD

## 2017-04-18 NOTE — Care Management Note (Signed)
Case Management Note Marvetta Gibbons RN, BSN Unit 4E-Case Manager-- [place] coverage [phone]  Patient Details  Name: [name] MRN: [id number] Date of Birth: [birth year]  Subjective/Objective:   Pt admitted  s/p: Right femoral endarterectomy with intraoperative cardiac arrest 11/21- to ICU intubated and CRRT, then had PEA arrest 05/04/2023 am and was coded and re-intubated                  Action/Plan: PTA pt lived at home with spouse- CM to follow for d/c needs.   Expected Discharge Date:                  Expected Discharge Plan:  [place]  In-House Referral:     Discharge planning Services  CM Consult  Post Acute Care Choice:  Home Health Choice offered to:     DME Arranged:    DME Agency:     HH Arranged:    [place] Agency:     Status of Service:  In process, will continue to follow  If discussed at Long Length of Stay Meetings, dates discussed:    Discharge Disposition:   Additional Comments:  Dawayne Patricia, RN 2017/05/03, 9:56 AM

## 2017-04-18 NOTE — Progress Notes (Addendum)
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  The patient was profoundly acidotic and once that was addressed patient had a stable rhythm and was brought to the ICU for CRRT to address profound metabolic acidosis.  Patient was unresponsive upon arrival but with stabilization of BP reportedly became responsive.  PCCM was consulted for vent and vital signs management.  Subsequently had 2nd code event on 11/23 at ~0600 that was PEA requiring CPR for 30 minutes. Reintubated at that time. Unclear etiology of code event.   SUBJECTIVE:  Awake alert  VITAL SIGNS: BP (!) 194/67   Pulse 98   Temp 98.5 F (36.9 C) (Oral)   Resp (!) 24   Wt 213 lb 3 oz (96.7 kg)   LMP 10/09/2015 Comment: pt on dialysis  SpO2 100%   BMI 25.95 kg/m   HEMODYNAMICS:    VENTILATOR SETTINGS: Vent Mode: PRVC FiO2 (%):  [40 %-100 %] 100 % Set Rate:  [24 bmp] 24 bmp Vt Set:  [660 mL] 660 mL PEEP:  [5 cmH20-10 cmH20] 10 cmH20 Pressure Support:  [5 cmH20] 5 cmH20 Plateau Pressure:  [18 cmH20-29 cmH20] 29 cmH20  INTAKE / OUTPUT: I/O last 3 completed shifts: In: 1430.2 [P.O.:20; I.V.:1010.2; IV Piggyback:400] Out: 4899 [Emesis/NG output:400; Drains:220; Other:4279]  PHYSICAL EXAMINATION: General: Well-nourished well-developed female no acute distress; anxious appearing HEENT: Endotracheal tube to ventilator no JVD appreciated PSY: Anxious appearing Neuro: Intact CV: Tachycardic; no murmurs PULM: Decreased breath sounds at the base but otherwise clear ventilated breath sounds GI: Abdomen tender,  distended Extremities: Lower extremities with ischemic changes on right foot cool Skin: no rashes or lesions   LABS:  BMET Recent Labs  Lab 04/17/17 1604 04/18/17 0445 04/18/17 0635  NA 137 138 141  K 4.2 5.1 5.8*  CL 100* 100* 101  CO2 26 25 22   BUN 19 25* 24*  CREATININE 2.86* 3.57* 3.54*  GLUCOSE 123* 166* 168*    Electrolytes Recent Labs  Lab 04/17/17 1604 04/17/17 1747 04/18/17 0445 04/18/17 0635  CALCIUM 8.6*  --  9.1 10.8*  MG  --  2.1 2.4 2.7*  PHOS 4.2  --  6.4* 9.0*    CBC Recent Labs  Lab 04/17/17 0451 04/18/17 0445 04/18/17 0635  WBC 8.0 11.9* 16.9*  HGB 8.3* 8.4* 9.3*  HCT 26.7* 27.9* 32.0*  PLT 197 238 239    Coag's Recent Labs  Lab 04/15/17 1356 04/18/17 0635  APTT 39*  --   INR 1.14 1.31    Sepsis Markers Recent Labs  Lab 04/18/17 0606  LATICACIDVEN 9.2*    ABG Recent Labs  Lab 04/17/17 0500 04/17/17 1053 04/18/17 0624  PHART 7.461* 7.356 7.207*  PCO2ART 37.0 48.1* 50.1*  PO2ART 116* 65.0* 37.0*    Liver Enzymes Recent Labs  Lab 04/15/17 1356  04/17/17 1604 04/18/17 0445 04/18/17 0635  AST 20  --   --   --  290*  ALT 15  --   --   --  140*  ALKPHOS 449*  --   --   --  281*  BILITOT 0.7  --   --   --  1.5*  ALBUMIN 2.8*   < > 2.3* 2.3* 2.2*   < > = values in this interval not displayed.    Cardiac Enzymes No results for input(s): TROPONINI, PROBNP in the last 168 hours.  Glucose Recent Labs  Lab 04/17/17 1930 04/17/17 2117 04/17/17 2231 04/17/17 2337 04/18/17 0407 04/18/17 0602  GLUCAP 111* 117* 122* 117* 135* 144*    Imaging Dg Chest Port 1 View  Result Date: 04/18/2017 CLINICAL DATA:  41 year old female status post cardiac arrest. 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 EXAM: PORTABLE CHEST 1 VIEW COMPARISON:  04/17/2017 and earlier. FINDINGS: Portable AP supine view at 0643 hours. Endotracheal tube tip  in good position at the level the clavicles. Enteric tube removed. Other curvilinear objects projecting over the mediastinum and central diaphragm are presumed to be external. Pacer/resuscitation pads are in place. Stable cardiomegaly and mediastinal contours. Continued increased bilateral interstitial opacity, mildly progressed since yesterday. Stable more confluent infrahilar opacity. No pleural effusion or pneumothorax. No consolidation. IMPRESSION: 1. Increased interstitial edema since yesterday. Stable cardiomegaly. No consolidation or pleural effusion is evident. 2. Enteric tube removed.  ET tube in good position. Electronically Signed   By: Genevie Ann M.D.   On: 04/18/2017 07:07     STUDIES:  Abdominal CT 11/21>>> Head CT 11/21>>> CTA PE>>>  CULTURES: Blood 11/21>>>NGTD  ANTIBIOTICS: Levaquin 11/21>>> Flagyl 11/21>>>  SIGNIFICANT EVENTS: 11/21>>> Cardiac arrest in OR 11/23>>>PEA Arrest  LINES/TUBES: ETT 11/21>>>11/22>>>11/23>>> L femoral HD catheter 11/21>>> R radial a-line 11/21>>>11/22 L Radial a-line 11/23>>>  DISCUSSION: 41 year old type I diabetic with all the classic complications with cardiac arrest in the OR for 2 hours but was with normal mental status and able to extubated 04/17/2017 and wean from pressors. Subsequent PEA Arrest on 11/23 AM at 0600 requiring reintubation. Unclear etiology of PEA, but possible secondary to acidosis versus hypoxia.   ASSESSMENT / PLAN:  PULMONARY A: VDRF with hypoxia s/p PEA Arrest 04/18/17 AM P:   -Full vent support; ABG post-arrest 7.207/50/37, will repeat now -Check CTA today to evaluate for PE -Check ABG for completeness  CARDIOVASCULAR A:  Circulatory Shock, unknown cause, with 2 code blue events this hospitalization HTN P:  -Off pressors -Hypertensive this AM s/p arrest; ? Related to pain. Will treat pain and evaluate for adding anti-hypertensive. Will likely add back Clavidipine gtt if remains significantly  hypertensive. - EKG monitoring - Cards following; repeat Echo ordered for today  RENAL Lab Results  Component Value Date   CREATININE 3.54 (H) 04/18/2017   CREATININE 3.57 (H) 04/18/2017   CREATININE 2.86 (H) 04/17/2017    A:   ESRD Metabolic acidosis Hyperkalemia P:   - Renal consult on 04/16/2017 - CRRT on hold for now; repeat BMP this AM to evaluate for ongoing hyperkalemia - Blood pressure is adequate she can undergo normal hemodialysis if needed  GASTROINTESTINAL A:   Concern for ischemic bowel P:   - Abdominal CT currently on hold but will likely obtain if goes for CTA PE protocol - NPO - Holding on TF for now given repeat arrest and concern for ischemic bowel  HEMATOLOGIC Recent Labs    04/18/17 0445 04/18/17 0635  HGB 8.4* 9.3*    A:   No active issues P:  - CBC - Transfuse per ICU protocol  INFECTIOUS A:   Concern for ischemic bowel or intraabdominal infection lactic acid was .95  p:   -  Levaquin - Flagyl -11/21 blood cultures x2>>NGTD  ENDOCRINE CBG (last 3)  Recent Labs    04/17/17 2337 04/18/17 0407 04/18/17 0602  GLUCAP 117* 135* 144*    A:   DM   P:   - ISS - CBG - No basal insulin in medication list, diabetic coordinator to see  NEUROLOGIC A:   Awake post code Awake and alert with no neurological deficits noted 04/18/17 P:   RASS goal: 0 - Morphine and Versed as needed - Avoid other sedating medications as able  FAMILY  - Updates: Family updated at bedside.  Patient awake and interactive despite being sedated and on mechanical ventilatory support.  - Inter-disciplinary family meet or Palliative Care meeting due by:  day 7  CCT 31 min  [place], Lynden Oxford MD PCCM

## 2017-04-18 NOTE — Progress Notes (Signed)
This RN called into pts room. Pt sitting up in bed stating that she can't breathe. Try to calm pt down to slow her breathing. Pt then became unresponsive. Unable to palpate pulse. CPR started. Code Blue called. See code sheet note for details. Family at bedside. Chaplain called. Family updated on current situation. Will continue to monitor.

## 2017-04-18 NOTE — Procedures (Signed)
Intubation Procedure Note [name] [id number] 06/14/1975  Procedure: Intubation Indications: Respiratory insufficiency  Procedure Details Consent: Unable to obtain consent because of emergent medical necessity. Time Out: Verified patient identification, verified procedure, site/side was marked, verified correct patient position, special equipment/implants available, medications/allergies/relevent history reviewed, required imaging and test results available.  Performed  Patient agonal, easy to BVM.  No premeds given.  No gag.  Maximum sterile technique was used including antiseptics, gloves, hand hygiene and mask.  MAC and 3,  Grade 1 view.  7.5 ett secured with commercial holder 24 at lip. Positive ETCO2 color change, bilateral breath sounds, absent epigastric.    Evaluation Hemodynamic Status: Persistent hypotension treated with pressors; O2 sats: hypoxic, improved with intubation Patient's Current Condition: unstable Complications: No apparent complications Patient did tolerate procedure well. Chest X-ray ordered to verify placement.  CXR: pending.  Procedure performed under direct supervision of Dr. Elsworth Soho.     Kennieth Rad, AGACNP-BC [facility] Pgr: [phone] or if no answer [phone] 04/18/2017, 6:46 AM

## 2017-04-18 NOTE — Progress Notes (Signed)
Initial Nutrition Assessment  DOCUMENTATION CODES:   Non-severe (moderate) malnutrition in context of chronic illness  INTERVENTION:   If pt remains intubated recommend Nepro @ 40 ml/hr (960 ml/day) 30 ml Prostat five times per day Provides: 2225 kcal, 152 grams protein, and 697 ml free water.    NUTRITION DIAGNOSIS:   Moderate Malnutrition related to chronic illness(ESRD and type 1 DM) as evidenced by mild fat depletion, moderate fat depletion, mild muscle depletion, moderate muscle depletion.  GOAL:   Patient will meet greater than or equal to 90% of their needs  MONITOR:   Vent status, I & O's  REASON FOR ASSESSMENT:   Ventilator    ASSESSMENT:   Pt with PMH of type 1 DM, ESRD on HD, PVD admitted for elective management of PAD. While in OR for femoral endarterectomy she developed severe hypotension and metabolic acidosis, cardiac arrest with resuscitative efforts for 2 hrs. Pt transferred to ICU for CRRT, extubated. On 11/23 at 0600 pt with second PEA arrest requiring CPR for 30 min and re-intubated.    Per MD concern for ischemic bowel, abd CT today if stable. Holding on TF for now. Nephrology monitoring for iHD vs CRRT.  Pt awake on vent, family at bedside. Daughter reports that she does not eat very much.   JP Drain R groin: 220 ml out x 24 hrs  Patient is currently intubated on ventilator support MV: 15.5 L/min Temp (24hrs), Avg:98.8 F (37.1 C), Min:97.8 F (36.6 C), Max:99.2 F (37.3 C)  Medications reviewed. Now off Cleviprex and CRRT.  Labs reviewed: PO4 5.7 (H) CBG: 180   NUTRITION - FOCUSED PHYSICAL EXAM:    Most Recent Value  Orbital Region  Mild depletion  Upper Arm Region  Moderate depletion  Thoracic and Lumbar Region  Mild depletion  Buccal Region  Unable to assess  Temple Region  No depletion  Clavicle Bone Region  Moderate depletion  Clavicle and Acromion Bone Region  Moderate depletion  Scapular Bone Region  Unable to assess  Dorsal  Hand  Mild depletion  Patellar Region  Mild depletion  Anterior Thigh Region  Mild depletion  Posterior Calf Region  No depletion  Edema (RD Assessment)  Mild  Hair  Reviewed  Eyes  Unable to assess  Mouth  Unable to assess  Skin  Reviewed  Nails  Reviewed       Diet Order:  Diet NPO time specified  EDUCATION NEEDS:   Not appropriate for education at this time   Skin:  Skin Assessment: Skin Integrity Issues: Skin Integrity Issues:: (Cellulitis: BLE, open scaral wound, degloving R toe, DM wound L toe)  Last BM:  11/20  Height:   Ht Readings from Last 1 Encounters:  04/15/17 6\' 4"  (1.93 m)    Weight:   Wt Readings from Last 1 Encounters:  04/18/17 213 lb 3 oz (96.7 kg)    Ideal Body Weight:  81.8 kg  BMI:  Body mass index is 25.95 kg/m.  Estimated Nutritional Needs:   Kcal:  2234  Protein:  130-175 grams (215 grams)  Fluid:  1.2 L/day   Maylon Peppers RD, LDN, CNSC [phone] Pager [phone] After Hours Pager

## 2017-04-18 NOTE — Progress Notes (Signed)
Pt transported to CT on vent without incident. Pt back in room with RN at bedside.

## 2017-04-18 NOTE — Consult Note (Addendum)
Reason for Consult: Cardiac arrest Referring Physician: [facility] 2 Heart  [name] is an 41 y.o. female.  HPI:   41 year old Caucasian female with uncontrolled hypertension, uncontrolled type 1 diabetes mellitus, end-stage renal disease on dialysis, heart failure with preserved ejection fraction, pulmonary hypertension group 2, chronic anemia, moderate CAD has been admitted to the hospital since 04/16/2017 for femoral endarterectomy. Perioperative and postoperative period has been complicated by cardiac arrest in the setting of metabolic acidosis. She was moved to heart for intensive monitoring. On the morning of 04/18/2017, she had another episode of PEA arrest. Telemetry shows brief episodes of Mobitz type 2 AV block, and brief asystole. She underwent CPR, brief transcutaneous pacing, along with resuscitation with medications, without any shocks in absence of VT/VF. She is intubated. She is awake and following commands. EKG shows no new ischemic changes. Echocardiogram shows preserved EF with no regional wall motion abnormalities.  Past Medical History:  Diagnosis Date  . Arthritis   . CHF (congestive heart failure) ([place])   . Chronic anemia    2nd to renal disease  . CIN III (cervical intraepithelial neoplasia grade III) with severe dysplasia    S/P LEEP AND CONE  . Coronary artery disease    Status post cardiac catheterization June 2012 scattered coronary artery disease/atherosclerosis with 70-80% stenosis in a small right PDA.  . Diabetic Charcot's joint disease ([place])   . DM (diabetes mellitus) ([place])    Long-term insulin  . End stage renal disease on dialysis [facility]) 05/02/11   "[facility]"; [facility]; M; W, F ("got it 04/15/2017 because of Thanksgiving")  . Fracture of 5th metatarsal 2016   Right  . Gastroparesis   . Heart murmur   . 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 History of abscesses in groins 12/06/2010  . Hyperlipidemia    Hypertriglyceridemia 449 HDL 25  . Hypertension   . Migraines     "just on dialysis days"  . Peripheral neuropathy    related to DM  . Peripheral vascular disease ([place])    Tibial occlusive disease evaluated by Dr. Kellie Simmering in August 2011. Medical therapy  . Renal insufficiency    Dialysis since 2012  . Seizures ([place])    "only when her sugar drops" (04/17/2017)  . Tobacco use disorder    Discontinued March 2012  . Trimalleolar fracture of ankle, closed 02/09/2015   Right    Past Surgical History:  Procedure Laterality Date  . ABDOMINAL AORTOGRAM W/LOWER EXTREMITY [name] 03/27/2017   Procedure: ABDOMINAL AORTOGRAM W/LOWER EXTREMITY;  Surgeon: Conrad Cannondale, MD;  Location: [place] CV LAB;  Service: Cardiovascular;  Laterality: [name];  . AV FISTULA PLACEMENT Left 04/2010   "lower arm"  . CARDIAC CATHETERIZATION [name] 02/10/2015   Procedure: Left Heart Cath and Coronary Angiography;  Surgeon: Dixie Dials, MD;  Location: [place] CV LAB;  Service: Cardiovascular;  Laterality: [name];  . CARDIAC CATHETERIZATION [name] 02/21/2016   Procedure: Right Heart Cath;  Surgeon: Jolaine Artist, MD;  Location: [place] CV LAB;  Service: Cardiovascular;  Laterality: [name];  . CATARACT EXTRACTION W/ INTRAOCULAR LENS  IMPLANT, BILATERAL Bilateral   . CERVICAL BIOPSY  W/ LOOP ELECTRODE EXCISION     h/o  . CERVICAL CONE BIOPSY     h/o  . COLPOSCOPY    . DILATION AND CURETTAGE OF UTERUS  2009  . FRACTURE SURGERY    . HERNIA REPAIR    . INCISION AND DRAINAGE ABSCESS     "groin"  . IR  HYBRID TRAUMA EMBOLIZATION  04/16/2017  . ORIF ANKLE FRACTURE Right 02/16/2015   Procedure: OPEN REDUCTION INTERNAL FIXATION (ORIF) RIGHT ANKLE FRACTURE;  Surgeon: Altamese St. John, MD;  Location: [place];  Service: Orthopedics;  Laterality: Right;  . UMBILICAL HERNIA REPAIR  X 2    Family History  Problem Relation Age of Onset  . Diabetes Mother   . Hypertension Mother   . Diabetes Father   . Hyperlipidemia Father   . Hypertension Father   . Diabetes Sister   . Stroke Maternal  Grandmother   . Diabetes Sister   . Breast cancer Maternal Aunt        Age 16's    Social History:  reports that she quit smoking about 5 years ago. Her smoking use included cigarettes. She has a 3.00 pack-year smoking history. she has never used smokeless tobacco. She reports that she does not drink alcohol or use drugs.  Allergies:  Allergies  Allergen Reactions  . Cephalexin Other (See Comments)    Reaction unknown- Childhood allergy Tolerated Ceftriaxone in the past  . Sulfamethoxazole-Trimethoprim Other (See Comments)    Unknown reaction. Pt states that she was told by her mother that she had allergy to Bactrim as a child.    Medications: I have reviewed the patient's current medications.  Results for orders placed or performed during the hospital encounter of 04/16/17 (from the past 48 hour(s))  I-STAT 4, (NA,K, GLUC, HGB,HCT)     Status: Abnormal   Collection Time: 04/16/17 11:10 AM  Result Value Ref Range   Sodium 136 135 - 145 mmol/L   Potassium 3.8 3.5 - 5.1 mmol/L   Glucose, Bld 200 (H) 65 - 99 mg/dL   HCT 24.0 (L) 36.0 - 46.0 %   Hemoglobin 8.2 (L) 12.0 - 15.0 g/dL  POCT Activated clotting time     Status: None   Collection Time: 04/16/17 11:21 AM  Result Value Ref Range   Activated Clotting Time 197 seconds  I-STAT 7, (LYTES, BLD GAS, ICA, H+H)     Status: Abnormal   Collection Time: 04/16/17 11:40 AM  Result Value Ref Range   pH, Arterial 7.335 (L) 7.350 - 7.450   pCO2 arterial 42.2 32.0 - 48.0 mmHg   pO2, Arterial 47.0 (L) 83.0 - 108.0 mmHg   Bicarbonate 22.5 20.0 - 28.0 mmol/L   TCO2 24 22 - 32 mmol/L   O2 Saturation 80.0 %   Acid-base deficit 3.0 (H) 0.0 - 2.0 mmol/L   Sodium 137 135 - 145 mmol/L   Potassium 4.8 3.5 - 5.1 mmol/L   Calcium, Ion 1.10 (L) 1.15 - 1.40 mmol/L   HCT 27.0 (L) 36.0 - 46.0 %   Hemoglobin 9.2 (L) 12.0 - 15.0 g/dL   Patient temperature HIDE    Sample type ARTERIAL   I-STAT 7, (LYTES, BLD GAS, ICA, H+H)     Status: Abnormal    Collection Time: 04/16/17 12:19 PM  Result Value Ref Range   pH, Arterial 7.080 (LL) 7.350 - 7.450   pCO2 arterial 61.9 (H) 32.0 - 48.0 mmHg   pO2, Arterial 55.0 (L) 83.0 - 108.0 mmHg   Bicarbonate 18.3 (L) 20.0 - 28.0 mmol/L   TCO2 20 (L) 22 - 32 mmol/L   O2 Saturation 74.0 %   Acid-base deficit 12.0 (H) 0.0 - 2.0 mmol/L   Sodium 138 135 - 145 mmol/L   Potassium 4.5 3.5 - 5.1 mmol/L   Calcium, Ion 1.04 (L) 1.15 - 1.40 mmol/L   HCT  32.0 (L) 36.0 - 46.0 %   Hemoglobin 10.9 (L) 12.0 - 15.0 g/dL   Patient temperature HIDE    Sample type ARTERIAL   I-STAT 7, (LYTES, BLD GAS, ICA, H+H)     Status: Abnormal   Collection Time: 04/16/17  1:10 PM  Result Value Ref Range   pH, Arterial 7.111 (LL) 7.350 - 7.450   pCO2 arterial 69.3 (HH) 32.0 - 48.0 mmHg   pO2, Arterial 42.0 (L) 83.0 - 108.0 mmHg   Bicarbonate 22.7 20.0 - 28.0 mmol/L   TCO2 25 22 - 32 mmol/L   O2 Saturation 66.0 %   Acid-base deficit 8.0 (H) 0.0 - 2.0 mmol/L   Sodium 139 135 - 145 mmol/L   Potassium 5.5 (H) 3.5 - 5.1 mmol/L   Calcium, Ion 0.97 (L) 1.15 - 1.40 mmol/L   HCT 29.0 (L) 36.0 - 46.0 %   Hemoglobin 9.9 (L) 12.0 - 15.0 g/dL   Patient temperature 35.2 C    Sample type ARTERIAL    Comment NOTIFIED PHYSICIAN   Glucose, capillary     Status: Abnormal   Collection Time: 04/16/17  3:05 PM  Result Value Ref Range   Glucose-Capillary 188 (H) 65 - 99 mg/dL  I-STAT 3, arterial blood gas (G3+)     Status: None   Collection Time: 04/16/17  3:06 PM  Result Value Ref Range   pH, Arterial 7.432 7.350 - 7.450   pCO2 arterial 39.2 32.0 - 48.0 mmHg   pO2, Arterial 88.0 83.0 - 108.0 mmHg   Bicarbonate 26.5 20.0 - 28.0 mmol/L   TCO2 28 22 - 32 mmol/L   O2 Saturation 97.0 %   Acid-Base Excess 2.0 0.0 - 2.0 mmol/L   Patient temperature 35.5 C    Sample type ARTERIAL   Culture, blood (Routine X 2) w Reflex to ID Panel     Status: None (Preliminary result)   Collection Time: 04/16/17  3:28 PM  Result Value Ref Range    Specimen Description BLOOD LEFT HAND    Special Requests      AEROBIC BOTTLE ONLY Blood Culture results may not be optimal due to an inadequate volume of blood received in culture bottles   Culture NO GROWTH < 24 HOURS    Report Status PENDING   Culture, blood (Routine X 2) w Reflex to ID Panel     Status: None (Preliminary result)   Collection Time: 04/16/17  3:28 PM  Result Value Ref Range   Specimen Description BLOOD LEFT HAND    Special Requests IN PEDIATRIC BOTTLE Blood Culture adequate volume    Culture NO GROWTH < 24 HOURS    Report Status PENDING   Glucose, capillary     Status: Abnormal   Collection Time: 04/16/17  4:15 PM  Result Value Ref Range   Glucose-Capillary 170 (H) 65 - 99 mg/dL  CBC     Status: Abnormal   Collection Time: 04/16/17  6:22 PM  Result Value Ref Range   WBC 14.7 (H) 4.0 - 10.5 K/uL   RBC 2.92 (L) 3.87 - 5.11 MIL/uL   Hemoglobin 8.4 (L) 12.0 - 15.0 g/dL   HCT 26.6 (L) 36.0 - 46.0 %   MCV 91.1 78.0 - 100.0 fL   MCH 28.8 26.0 - 34.0 pg   MCHC 31.6 30.0 - 36.0 g/dL   RDW 17.6 (H) 11.5 - 15.5 %   Platelets 209 150 - 400 K/uL  Glucose, capillary     Status: Abnormal   Collection  Time: 04/16/17  8:08 PM  Result Value Ref Range   Glucose-Capillary 145 (H) 65 - 99 mg/dL   Comment 1 Capillary Specimen   Renal function panel (daily at 1600)     Status: Abnormal   Collection Time: 04/16/17  8:32 PM  Result Value Ref Range   Sodium 137 135 - 145 mmol/L   Potassium 3.7 3.5 - 5.1 mmol/L    Comment: DELTA CHECK NOTED   Chloride 99 (L) 101 - 111 mmol/L   CO2 26 22 - 32 mmol/L   Glucose, Bld 151 (H) 65 - 99 mg/dL   BUN 31 (H) 6 - 20 mg/dL   Creatinine, Ser 4.04 (H) 0.44 - 1.00 mg/dL   Calcium 8.1 (L) 8.9 - 10.3 mg/dL   Phosphorus 5.5 (H) 2.5 - 4.6 mg/dL   Albumin 2.1 (L) 3.5 - 5.0 g/dL   GFR calc non Af Amer 13 (L) >60 mL/min   GFR calc Af Amer 15 (L) >60 mL/min    Comment: (NOTE) The eGFR has been calculated using the CKD EPI equation. This calculation  has not been validated in all clinical situations. eGFR's persistently <60 mL/min signify possible Chronic Kidney Disease.    Anion gap 12 5 - 15  CBC     Status: Abnormal   Collection Time: 04/16/17 11:25 PM  Result Value Ref Range   WBC 8.9 4.0 - 10.5 K/uL   RBC 2.78 (L) 3.87 - 5.11 MIL/uL   Hemoglobin 8.2 (L) 12.0 - 15.0 g/dL   HCT 25.2 (L) 36.0 - 46.0 %   MCV 90.6 78.0 - 100.0 fL   MCH 29.5 26.0 - 34.0 pg   MCHC 32.5 30.0 - 36.0 g/dL   RDW 17.9 (H) 11.5 - 15.5 %   Platelets 174 150 - 400 K/uL  Glucose, capillary     Status: Abnormal   Collection Time: 04/16/17 11:43 PM  Result Value Ref Range   Glucose-Capillary 122 (H) 65 - 99 mg/dL  Glucose, capillary     Status: Abnormal   Collection Time: 04/17/17  3:16 AM  Result Value Ref Range   Glucose-Capillary 114 (H) 65 - 99 mg/dL   Comment 1 Venous Specimen   Renal function panel (daily at 0500)     Status: Abnormal   Collection Time: 04/17/17  4:51 AM  Result Value Ref Range   Sodium 137 135 - 145 mmol/L   Potassium 3.9 3.5 - 5.1 mmol/L   Chloride 100 (L) 101 - 111 mmol/L   CO2 24 22 - 32 mmol/L   Glucose, Bld 122 (H) 65 - 99 mg/dL   BUN 26 (H) 6 - 20 mg/dL   Creatinine, Ser 3.47 (H) 0.44 - 1.00 mg/dL   Calcium 8.4 (L) 8.9 - 10.3 mg/dL   Phosphorus 4.6 2.5 - 4.6 mg/dL   Albumin 2.1 (L) 3.5 - 5.0 g/dL   GFR calc non Af Amer 15 (L) >60 mL/min   GFR calc Af Amer 18 (L) >60 mL/min    Comment: (NOTE) The eGFR has been calculated using the CKD EPI equation. This calculation has not been validated in all clinical situations. eGFR's persistently <60 mL/min signify possible Chronic Kidney Disease.    Anion gap 13 5 - 15  Magnesium     Status: None   Collection Time: 04/17/17  4:51 AM  Result Value Ref Range   Magnesium 1.9 1.7 - 2.4 mg/dL  CBC     Status: Abnormal   Collection Time: 04/17/17  4:51 AM  Result Value Ref Range   WBC 8.0 4.0 - 10.5 K/uL   RBC 2.92 (L) 3.87 - 5.11 MIL/uL   Hemoglobin 8.3 (L) 12.0 - 15.0  g/dL   HCT 26.7 (L) 36.0 - 46.0 %   MCV 91.4 78.0 - 100.0 fL   MCH 28.4 26.0 - 34.0 pg   MCHC 31.1 30.0 - 36.0 g/dL   RDW 17.7 (H) 11.5 - 15.5 %   Platelets 197 150 - 400 K/uL  Basic metabolic panel     Status: Abnormal   Collection Time: 04/17/17  4:51 AM  Result Value Ref Range   Sodium 137 135 - 145 mmol/L   Potassium 3.9 3.5 - 5.1 mmol/L   Chloride 101 101 - 111 mmol/L   CO2 25 22 - 32 mmol/L   Glucose, Bld 122 (H) 65 - 99 mg/dL   BUN 26 (H) 6 - 20 mg/dL   Creatinine, Ser 3.41 (H) 0.44 - 1.00 mg/dL   Calcium 8.5 (L) 8.9 - 10.3 mg/dL   GFR calc non Af Amer 16 (L) >60 mL/min   GFR calc Af Amer 18 (L) >60 mL/min    Comment: (NOTE) The eGFR has been calculated using the CKD EPI equation. This calculation has not been validated in all clinical situations. eGFR's persistently <60 mL/min signify possible Chronic Kidney Disease.    Anion gap 11 5 - 15  Phosphorus     Status: None   Collection Time: 04/17/17  4:51 AM  Result Value Ref Range   Phosphorus 4.5 2.5 - 4.6 mg/dL  Blood gas, arterial     Status: Abnormal   Collection Time: 04/17/17  5:00 AM  Result Value Ref Range   FIO2 50.00    Delivery systems VENTILATOR    Mode PRESSURE REGULATED VOLUME CONTROL    VT 640 mL   LHR 20 resp/min   Peep/cpap 10.0 cm H20   pH, Arterial 7.461 (H) 7.350 - 7.450   pCO2 arterial 37.0 32.0 - 48.0 mmHg   pO2, Arterial 116 (H) 83.0 - 108.0 mmHg   Bicarbonate 26.0 20.0 - 28.0 mmol/L   Acid-Base Excess 2.4 (H) 0.0 - 2.0 mmol/L   O2 Saturation 96.7 %   Patient temperature 98.6    Collection site A-LINE    Drawn by DRAWN BY RN    Sample type ARTERIAL DRAW    Allens test (pass/fail) PASS PASS  Glucose, capillary     Status: Abnormal   Collection Time: 04/17/17  8:07 AM  Result Value Ref Range   Glucose-Capillary 114 (H) 65 - 99 mg/dL   Comment 1 Notify RN   I-STAT 3, arterial blood gas (G3+)     Status: Abnormal   Collection Time: 04/17/17 10:53 AM  Result Value Ref Range   pH,  Arterial 7.356 7.350 - 7.450   pCO2 arterial 48.1 (H) 32.0 - 48.0 mmHg   pO2, Arterial 65.0 (L) 83.0 - 108.0 mmHg   Bicarbonate 27.0 20.0 - 28.0 mmol/L   TCO2 29 22 - 32 mmol/L   O2 Saturation 91.0 %   Acid-Base Excess 1.0 0.0 - 2.0 mmol/L   Patient temperature 36.7 C    Sample type ARTERIAL   Glucose, capillary     Status: Abnormal   Collection Time: 04/17/17 12:39 PM  Result Value Ref Range   Glucose-Capillary 133 (H) 65 - 99 mg/dL   Comment 1 Notify RN   Renal function panel (daily at 1600)     Status: Abnormal   Collection  Time: 04/17/17  4:04 PM  Result Value Ref Range   Sodium 137 135 - 145 mmol/L   Potassium 4.2 3.5 - 5.1 mmol/L   Chloride 100 (L) 101 - 111 mmol/L   CO2 26 22 - 32 mmol/L   Glucose, Bld 123 (H) 65 - 99 mg/dL   BUN 19 6 - 20 mg/dL   Creatinine, Ser 2.86 (H) 0.44 - 1.00 mg/dL   Calcium 8.6 (L) 8.9 - 10.3 mg/dL   Phosphorus 4.2 2.5 - 4.6 mg/dL   Albumin 2.3 (L) 3.5 - 5.0 g/dL   GFR calc non Af Amer 19 (L) >60 mL/min   GFR calc Af Amer 22 (L) >60 mL/min    Comment: (NOTE) The eGFR has been calculated using the CKD EPI equation. This calculation has not been validated in all clinical situations. eGFR's persistently <60 mL/min signify possible Chronic Kidney Disease.    Anion gap 11 5 - 15  Magnesium     Status: None   Collection Time: 04/17/17  5:47 PM  Result Value Ref Range   Magnesium 2.1 1.7 - 2.4 mg/dL  Glucose, capillary     Status: Abnormal   Collection Time: 04/17/17  7:30 PM  Result Value Ref Range   Glucose-Capillary 111 (H) 65 - 99 mg/dL   Comment 1 Capillary Specimen   Glucose, capillary     Status: Abnormal   Collection Time: 04/17/17  9:17 PM  Result Value Ref Range   Glucose-Capillary 117 (H) 65 - 99 mg/dL   Comment 1 Capillary Specimen   Glucose, capillary     Status: Abnormal   Collection Time: 04/17/17 10:31 PM  Result Value Ref Range   Glucose-Capillary 122 (H) 65 - 99 mg/dL   Comment 1 Venous Specimen   Glucose, capillary      Status: Abnormal   Collection Time: 04/17/17 11:37 PM  Result Value Ref Range   Glucose-Capillary 117 (H) 65 - 99 mg/dL   Comment 1 Capillary Specimen   Glucose, capillary     Status: Abnormal   Collection Time: 04/18/17  4:07 AM  Result Value Ref Range   Glucose-Capillary 135 (H) 65 - 99 mg/dL   Comment 1 Capillary Specimen   Renal function panel (daily at 0500)     Status: Abnormal   Collection Time: 04/18/17  4:45 AM  Result Value Ref Range   Sodium 138 135 - 145 mmol/L   Potassium 5.1 3.5 - 5.1 mmol/L    Comment: DELTA CHECK NOTED   Chloride 100 (L) 101 - 111 mmol/L   CO2 25 22 - 32 mmol/L   Glucose, Bld 166 (H) 65 - 99 mg/dL   BUN 25 (H) 6 - 20 mg/dL   Creatinine, Ser 3.57 (H) 0.44 - 1.00 mg/dL   Calcium 9.1 8.9 - 10.3 mg/dL   Phosphorus 6.4 (H) 2.5 - 4.6 mg/dL   Albumin 2.3 (L) 3.5 - 5.0 g/dL   GFR calc non Af Amer 15 (L) >60 mL/min   GFR calc Af Amer 17 (L) >60 mL/min    Comment: (NOTE) The eGFR has been calculated using the CKD EPI equation. This calculation has not been validated in all clinical situations. eGFR's persistently <60 mL/min signify possible Chronic Kidney Disease.    Anion gap 13 5 - 15  CBC with Differential/Platelet     Status: Abnormal   Collection Time: 04/18/17  4:45 AM  Result Value Ref Range   WBC 11.9 (H) 4.0 - 10.5 K/uL   RBC 2.94 (  L) 3.87 - 5.11 MIL/uL   Hemoglobin 8.4 (L) 12.0 - 15.0 g/dL   HCT 27.9 (L) 36.0 - 46.0 %   MCV 94.9 78.0 - 100.0 fL   MCH 28.6 26.0 - 34.0 pg   MCHC 30.1 30.0 - 36.0 g/dL   RDW 17.8 (H) 11.5 - 15.5 %   Platelets 238 150 - 400 K/uL   Neutrophils Relative % 77 %   Neutro Abs 9.1 (H) 1.7 - 7.7 K/uL   Lymphocytes Relative 10 %   Lymphs Abs 1.2 0.7 - 4.0 K/uL   Monocytes Relative 12 %   Monocytes Absolute 1.4 (H) 0.1 - 1.0 K/uL   Eosinophils Relative 1 %   Eosinophils Absolute 0.2 0.0 - 0.7 K/uL   Basophils Relative 0 %   Basophils Absolute 0.0 0.0 - 0.1 K/uL  Magnesium     Status: None   Collection Time:  04/18/17  4:45 AM  Result Value Ref Range   Magnesium 2.4 1.7 - 2.4 mg/dL  Glucose, capillary     Status: Abnormal   Collection Time: 04/18/17  6:02 AM  Result Value Ref Range   Glucose-Capillary 144 (H) 65 - 99 mg/dL  Lactic acid, plasma     Status: Abnormal   Collection Time: 04/18/17  6:06 AM  Result Value Ref Range   Lactic Acid, Venous 9.2 (HH) 0.5 - 1.9 mmol/L    Comment: CRITICAL RESULT CALLED TO, READ BACK BY AND VERIFIED WITH: M.BAILEY,RN [id number] 04/18/17 CLARK,S   I-STAT 3, arterial blood gas (G3+)     Status: Abnormal   Collection Time: 04/18/17  6:24 AM  Result Value Ref Range   pH, Arterial 7.207 (L) 7.350 - 7.450   pCO2 arterial 50.1 (H) 32.0 - 48.0 mmHg   pO2, Arterial 37.0 (LL) 83.0 - 108.0 mmHg   Bicarbonate 20.0 20.0 - 28.0 mmol/L   TCO2 22 22 - 32 mmol/L   O2 Saturation 59.0 %   Acid-base deficit 8.0 (H) 0.0 - 2.0 mmol/L   Patient temperature 98.0 F    Sample type ARTERIAL   CBC     Status: Abnormal   Collection Time: 04/18/17  6:35 AM  Result Value Ref Range   WBC 16.9 (H) 4.0 - 10.5 K/uL   RBC 3.25 (L) 3.87 - 5.11 MIL/uL   Hemoglobin 9.3 (L) 12.0 - 15.0 g/dL   HCT 32.0 (L) 36.0 - 46.0 %   MCV 98.5 78.0 - 100.0 fL   MCH 28.6 26.0 - 34.0 pg   MCHC 29.1 (L) 30.0 - 36.0 g/dL   RDW 18.0 (H) 11.5 - 15.5 %   Platelets 239 150 - 400 K/uL  Protime-INR     Status: Abnormal   Collection Time: 04/18/17  6:35 AM  Result Value Ref Range   Prothrombin Time 16.2 (H) 11.4 - 15.2 seconds   INR 1.31   Comprehensive metabolic panel     Status: Abnormal   Collection Time: 04/18/17  6:35 AM  Result Value Ref Range   Sodium 141 135 - 145 mmol/L   Potassium 5.8 (H) 3.5 - 5.1 mmol/L   Chloride 101 101 - 111 mmol/L   CO2 22 22 - 32 mmol/L   Glucose, Bld 168 (H) 65 - 99 mg/dL   BUN 24 (H) 6 - 20 mg/dL   Creatinine, Ser 3.54 (H) 0.44 - 1.00 mg/dL   Calcium 10.8 (H) 8.9 - 10.3 mg/dL   Total Protein 5.8 (L) 6.5 - 8.1 g/dL   Albumin 2.2 (L)  3.5 - 5.0 g/dL   AST 290 (H) 15 -  41 U/L    Comment: RESULTS CONFIRMED BY MANUAL DILUTION   ALT 140 (H) 14 - 54 U/L   Alkaline Phosphatase 281 (H) 38 - 126 U/L   Total Bilirubin 1.5 (H) 0.3 - 1.2 mg/dL   GFR calc non Af Amer 15 (L) >60 mL/min   GFR calc Af Amer 17 (L) >60 mL/min    Comment: (NOTE) The eGFR has been calculated using the CKD EPI equation. This calculation has not been validated in all clinical situations. eGFR's persistently <60 mL/min signify possible Chronic Kidney Disease.    Anion gap 18 (H) 5 - 15  Troponin I     Status: Abnormal   Collection Time: 04/18/17  6:35 AM  Result Value Ref Range   Troponin I 0.67 (HH) <0.03 ng/mL    Comment: CRITICAL RESULT CALLED TO, READ BACK BY AND VERIFIED WITH: M.BAILEY,RN [id number] 04/18/17 CLARK,S   Magnesium     Status: Abnormal   Collection Time: 04/18/17  6:35 AM  Result Value Ref Range   Magnesium 2.7 (H) 1.7 - 2.4 mg/dL  Phosphorus     Status: Abnormal   Collection Time: 04/18/17  6:35 AM  Result Value Ref Range   Phosphorus 9.0 (H) 2.5 - 4.6 mg/dL  Glucose, capillary     Status: Abnormal   Collection Time: 04/18/17  8:15 AM  Result Value Ref Range   Glucose-Capillary 180 (H) 65 - 99 mg/dL   Comment 1 Arterial Specimen   Blood gas, arterial     Status: Abnormal   Collection Time: 04/18/17  9:25 AM  Result Value Ref Range   FIO2 100.00    Delivery systems VENTILATOR    Mode PRESSURE REGULATED VOLUME CONTROL    VT 660 mL   LHR 24 resp/min   Peep/cpap 10.0 cm H20   pH, Arterial 7.486 (H) 7.350 - 7.450   pCO2 arterial 33.6 32.0 - 48.0 mmHg   pO2, Arterial 268 (H) 83.0 - 108.0 mmHg   Bicarbonate 25.1 20.0 - 28.0 mmol/L   Acid-Base Excess 1.9 0.0 - 2.0 mmol/L   O2 Saturation 98.0 %   Patient temperature 98.6    Collection site A-LINE    Drawn by [id number]    Sample type ARTERIAL DRAW   Renal function panel     Status: Abnormal   Collection Time: 04/18/17 10:00 AM  Result Value Ref Range   Sodium 139 135 - 145 mmol/L   Potassium 4.6 3.5 - 5.1 mmol/L    Chloride 101 101 - 111 mmol/L   CO2 25 22 - 32 mmol/L   Glucose, Bld 186 (H) 65 - 99 mg/dL   BUN 31 (H) 6 - 20 mg/dL   Creatinine, Ser 3.68 (H) 0.44 - 1.00 mg/dL   Calcium 9.6 8.9 - 10.3 mg/dL   Phosphorus 5.7 (H) 2.5 - 4.6 mg/dL   Albumin 2.2 (L) 3.5 - 5.0 g/dL   GFR calc non Af Amer 14 (L) >60 mL/min   GFR calc Af Amer 17 (L) >60 mL/min    Comment: (NOTE) The eGFR has been calculated using the CKD EPI equation. This calculation has not been validated in all clinical situations. eGFR's persistently <60 mL/min signify possible Chronic Kidney Disease.    Anion gap 13 5 - 15    Dg Chest 1 View  Result Date: 04/16/2017 INDICATION: 41 year old female with acute cardiopulmonary arrest which occurred spontaneously during right femoral endarterectomy. There was some  evidence of right ventricular dilatation by emergent trans esophageal echocardiography. Interventional Radiology was requested to perform emergent pulmonary arteriography to evaluate for acute pulmonary embolism as the cause of cardiac arrest. If pulmonary embolism is identified, thrombolysis may be performed. EXAM: IR HYBRID TRAUMA EMBOLIZATION; DG C-ARM 61-120 MIN; CHEST  1 VIEW COMPARISON:  None MEDICATIONS: None. ANESTHESIA/SEDATION: Patient currently under general anesthesia and being resuscitated by the anesthesiology service. FLUOROSCOPY TIME:  Fluoroscopy Time: 6 minutes 42 seconds COMPLICATIONS: None immediate. TECHNIQUE: Informed written consent was obtained from the patient after a thorough discussion of the procedural risks, benefits and alternatives. All questions were addressed. Maximal Sterile Barrier Technique was utilized including caps, mask, sterile gowns, sterile gloves, sterile drape, hand hygiene and skin antiseptic. A timeout was performed prior to the initiation of the procedure. A left common femoral venous access had already been obtained by Dr. Trula Slade. In between cycles of chest compressions, a 6 French MPA  catheter was advanced over a Bentson wire into the right heart. Using a glidewire, the catheter was then advanced through the heart and into the main pulmonary artery. An arteriogram was performed. The main, central and lobar pulmonary arteries are widely patent. There is no evidence of filling defect to suggest a large volume pulmonary embolus. Therefore, no thrombolysis was performed. The catheter was removed. FINDINGS: The main, central and lobar pulmonary arteries are widely patent. There is no evidence of filling defect to suggest a large volume pulmonary embolus. IMPRESSION: Pulmonary angiography is negative for large volume pulmonary embolus. Signed, Criselda Peaches, MD [facility] Radiology Electronically Signed   By: Jacqulynn Cadet M.D.   On: 04/16/2017 16:35   Dg Chest Port 1 View  Result Date: 04/18/2017 CLINICAL DATA:  41 year old female status post cardiac arrest. 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 EXAM: PORTABLE CHEST 1 VIEW COMPARISON:  04/17/2017 and earlier. FINDINGS: Portable AP supine view at 0643 hours. Endotracheal tube tip in good position at the level the clavicles. Enteric tube removed. Other curvilinear objects projecting over the mediastinum and central diaphragm are presumed to be external. Pacer/resuscitation pads are in place. Stable cardiomegaly and mediastinal contours. Continued increased bilateral interstitial opacity, mildly progressed since yesterday. Stable more confluent infrahilar opacity. No pleural effusion or pneumothorax. No consolidation. IMPRESSION: 1. Increased interstitial edema since yesterday. Stable cardiomegaly. No consolidation or pleural effusion is evident. 2. Enteric tube removed.  ET tube in good position. Electronically Signed   By: Genevie Ann M.D.   On: 04/18/2017 07:07   Dg Chest Port 1 View  Result  Date: 04/17/2017 CLINICAL DATA:  Hypoxia EXAM: PORTABLE CHEST 1 VIEW COMPARISON:  April 16, 2017 FINDINGS: Endotracheal tube tip is 6.0 cm above the carina. Nasogastric tube tip and side port are below the diaphragm. No pneumothorax. There is a small right pleural effusion. No edema or consolidation. There is cardiomegaly with pulmonary vascularity within normal limits. No adenopathy. There is aortic atherosclerosis. No evident bone lesions. IMPRESSION: Tube positions as described without pneumothorax. Stable cardiomegaly. Small right pleural effusion. No edema or consolidation. There is aortic atherosclerosis. Aortic Atherosclerosis (ICD10-I70.0). Electronically Signed   By: Lowella Grip III M.D.   On: 04/17/2017 09:13   Dg Chest Port 1 View  Result Date: 04/16/2017 CLINICAL DATA:  Endotracheal tube placement. EXAM: PORTABLE CHEST 1 VIEW COMPARISON:  Radiographs of February 02, 2017. FINDINGS: Stable cardiomegaly is noted. Endotracheal tube is noted  projected over tracheal air shadow with distal tip 5 cm above the carina. Nasogastric tube is seen with distal tip at gastroesophageal junction. No pneumothorax is noted. Bilateral perihilar and basilar pulmonary edema is noted. Bony thorax is unremarkable. IMPRESSION: Endotracheal tube in grossly good position. Nasogastric tube tip appears to be in expected position of gastroesophageal junction. Probable bilateral pulmonary edema is noted. Electronically Signed   By: Marijo Conception, M.D.   On: 04/16/2017 14:52   Ir Hybrid Trauma Embolization  Result Date: 04/16/2017 INDICATION: 41 year old female with acute cardiopulmonary arrest which occurred spontaneously during right femoral endarterectomy. There was some evidence of right ventricular dilatation by emergent trans esophageal echocardiography. Interventional Radiology was requested to perform emergent pulmonary arteriography to evaluate for acute pulmonary embolism as the cause of cardiac arrest.  If pulmonary embolism is identified, thrombolysis may be performed. EXAM: IR HYBRID TRAUMA EMBOLIZATION; DG C-ARM 61-120 MIN; CHEST  1 VIEW COMPARISON:  None MEDICATIONS: None. ANESTHESIA/SEDATION: Patient currently under general anesthesia and being resuscitated by the anesthesiology service. FLUOROSCOPY TIME:  Fluoroscopy Time: 6 minutes 42 seconds COMPLICATIONS: None immediate. TECHNIQUE: Informed written consent was obtained from the patient after a thorough discussion of the procedural risks, benefits and alternatives. All questions were addressed. Maximal Sterile Barrier Technique was utilized including caps, mask, sterile gowns, sterile gloves, sterile drape, hand hygiene and skin antiseptic. A timeout was performed prior to the initiation of the procedure. A left common femoral venous access had already been obtained by Dr. Trula Slade. In between cycles of chest compressions, a 6 French MPA catheter was advanced over a Bentson wire into the right heart. Using a glidewire, the catheter was then advanced through the heart and into the main pulmonary artery. An arteriogram was performed. The main, central and lobar pulmonary arteries are widely patent. There is no evidence of filling defect to suggest a large volume pulmonary embolus. Therefore, no thrombolysis was performed. The catheter was removed. FINDINGS: The main, central and lobar pulmonary arteries are widely patent. There is no evidence of filling defect to suggest a large volume pulmonary embolus. IMPRESSION: Pulmonary angiography is negative for large volume pulmonary embolus. Signed, Criselda Peaches, MD [facility] Electronically Signed   By: Jacqulynn Cadet M.D.   On: 04/16/2017 16:35   Dg C-arm 1-60 Min  Result Date: 04/16/2017 INDICATION: 41 year old female with acute cardiopulmonary arrest which occurred spontaneously during right femoral endarterectomy. There was some evidence  of right ventricular dilatation by emergent trans esophageal echocardiography. Interventional Radiology was requested to perform emergent pulmonary arteriography to evaluate for acute pulmonary embolism as the cause of cardiac arrest. If pulmonary embolism is identified, thrombolysis may be performed. EXAM: IR HYBRID TRAUMA EMBOLIZATION; DG C-ARM 61-120 MIN; CHEST  1 VIEW COMPARISON:  None MEDICATIONS: None. ANESTHESIA/SEDATION: Patient currently under general anesthesia and being resuscitated by the anesthesiology service. FLUOROSCOPY TIME:  Fluoroscopy Time: 6 minutes 42 seconds COMPLICATIONS: None immediate. TECHNIQUE: Informed written consent was obtained from the patient after a thorough discussion of the procedural risks, benefits and alternatives. All questions were addressed. Maximal Sterile Barrier Technique was utilized including caps, mask, sterile gowns, sterile gloves, sterile drape, hand hygiene and skin antiseptic. A timeout was performed prior to the initiation of the procedure. A left common femoral venous access had already been obtained by Dr. Trula Slade. In between cycles of chest compressions, a 6 French MPA catheter was advanced over a Bentson wire into the right heart. Using a glidewire, the catheter was  then advanced through the heart and into the main pulmonary artery. An arteriogram was performed. The main, central and lobar pulmonary arteries are widely patent. There is no evidence of filling defect to suggest a large volume pulmonary embolus. Therefore, no thrombolysis was performed. The catheter was removed. FINDINGS: The main, central and lobar pulmonary arteries are widely patent. There is no evidence of filling defect to suggest a large volume pulmonary embolus. IMPRESSION: Pulmonary angiography is negative for large volume pulmonary embolus. Signed, Criselda Peaches, MD [facility] Electronically Signed   By: Jacqulynn Cadet M.D.   On: 04/16/2017 16:35    Cardiac studies:  EKG 04/18/2017: Sinus rhythm at 94 bpm. Left axis deviation. Poor R-wave progression nonspecific ST changes lateral leads. No signs of ischemia.  Echocardiogram 04/18/2017 Study Conclusions  - Left ventricle: Moderate concentric hypertrophy. Wall motion was   normal; there were no regional wall motion abnormalities. Doppler   parameters are consistent with elevated mean left atrial filling   pressure. Grade II diastolic dysfunction. - Aortic valve: There was mild stenosis. - Right ventricle: The cavity size was mildly dilated. - Tricuspid valve: There was moderate regurgitation. - Pulmonary arteries: PA peak pressure: 59 mm Hg (S). - Pericardium, extracardiac: A small pericardial effusion was   identified posterior to the heart.  Impressions:  - No acute etiology for cardiac arrest identified on this   echocardiogram. Overall, no significant change compared to prior   transthoracic echocardiogram in 02/17/2016.  Review of Systems  Unable to perform ROS: Intubated  Cardiovascular: Positive for chest pain (Post CPR).   Blood pressure (!) 196/89, pulse 90, temperature 99.2 F (37.3 C), temperature source Oral, resp. rate (!) 21, weight 96.7 kg (213 lb 3 oz), last menstrual period 10/09/2015, SpO2 100 %. Physical Exam  Nursing note and vitals reviewed. Constitutional: She appears well-developed.  Sick appearing  HENT:  Head: Normocephalic and atraumatic.  Neck: No JVD present.  Cardiovascular: Normal rate and regular rhythm.  Dressing intact. JP drain in place. Warm RLE.   Respiratory:  Mechanical ventilation. Coarse breath sounds.  GI: Soft. Bowel sounds are normal. There is no tenderness.  Musculoskeletal: She exhibits no edema.  Neurological: She is alert.  Able to follow commands.    Assessment: PEA cardiac arrest. Brief Mobitz type 2 AV block in the setting of metabolic acidosis. Currently in sinus  rhythm 1:1 conduction. No significant IVCD. Mild troponin elevation, likely supply demand mismatch status post cardiac arrest. No signs of acute ischemia. Lactic acidosis Status post recent ileofemoral endarterectomy Uncontrolled hypertension Uncontrolled type 1 diabetes mellitus Heart failure with preserved ejection fraction Pulmonary hypertension WHO group II ESRD  Recommendations: Continue supportive management as you're doing. Currently, no indication for coronary angiogram or pacemaker. Continue current antihypertensive therapy. Consider adding aspirin. Continue statin.    Zira Helinski J Yaasir Menken 04/18/2017, 11:06 AM   [place], MD [facility] [place] Pager: [phone] Office: [phone] If no answer Cell [phone]

## 2017-04-18 NOTE — Progress Notes (Signed)
Called to room because it was reported that patient wasn't feeling well and having difficulty breathing. Upon arrival to the room pt apneic began bagging pt ambu bag with 100% FiO2 code blue was initiated.

## 2017-04-18 NOTE — Progress Notes (Signed)
Responded to page for this patient who was experiencing cardiac arrest.  Patient's loved ones are at bedside.   Providing compassionate presence and prayer with family.  Provided space for partner of the patient to express his feelings after she was revived and stabilized.  Will remain available as needed for this patient and family.    04/18/17 0702  Clinical Encounter Type  Visited With Patient and family together;Health care provider  Visit Type Initial;Spiritual support;Social support;Critical Care  Spiritual Encounters  Spiritual Needs Prayer;Emotional

## 2017-04-18 NOTE — Progress Notes (Signed)
[name] is a 41 yo female with PMH of uncontrolled HTN, DM II on insulin, HLD, chronic diastolic HF, moderate CAD, ESRD on HD MWF, and PAD. Myoview in 2015 showed no ischemia. She had mild 20-30% disease on catheterization in 2016. 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 CT of abdomen in 2017 showed small to moderate apical effusion and ascites as well. Repeat echo obtained on 02/17/2016 showed EF 55-60%, severe dilated LA, PA peak pressure 62 mmHg, small to moderate pericardial effusion. She underwent RHC by Dr. Haroldine Laws 02/21/2016 which showed CI 3.4, CO 7.7, mild to moderate pulmonary hypertension due to restrictive physiology and high output HF due to upper extremity AVF. She is being evaluated by [facility] renal transplant service in May 2018. Latest echo obtained at [facility] on 10/15/2016 showed EF >55%, moderate LVH, mild AI, PA peak pressure 49mmHg. Per family, her SBP usually run in the 170-200 range.   She presented on 04/16/2017 for R femoral endarterectomy in the setting of nonhealing bilateral toe wound. While in the OR, she suffered a cardiac arrest with resuscitation effort lasted 2 hours. She was later brought to ICU for CRRT to address metabolic acidosis. She was successfully extubated on 04/17/2017. According to the nurse her only complaint was soreness in the chest where people performed CPR in the OR. This morning, she complained of increasing SOB and then subsequently suffered another cardiac arrest. Resuscitation lated another 20-30 min. EKG prior to the event showed transient 2:1 AV block followed by loss of signal and V tach. [facility] was urgently consulted. She was able to achieve ROSC after epi and levophed. Pacing stopped.   I have discussed with Dr. Irven Shelling partner who will take over.   Physical exam: tachycardic, intubated. Clear lung.   Plan: Pending CXR Consider repeat echo Rule out  PE and respiratory issue as well Unclear if rhythm is the cause for her arrest, it seems more likely the bradycardic  rhythm was the response to her overall condition.  Potassium 5.1 this morning. Hgb 9.3.  May need antiarrhymic medication Will defer additional workup to Dr. Irven Shelling partner  Signed, Almyra Deforest PA Pager: [phone]

## 2017-04-19 ENCOUNTER — Inpatient Hospital Stay (HOSPITAL_COMMUNITY): Payer: Medicare Other

## 2017-04-19 DIAGNOSIS — I469 Cardiac arrest, cause unspecified: Secondary | ICD-10-CM

## 2017-04-19 LAB — RENAL FUNCTION PANEL
ANION GAP: 15 (ref 5–15)
ANION GAP: 15 (ref 5–15)
Albumin: 2.2 g/dL — ABNORMAL LOW (ref 3.5–5.0)
Albumin: 2.3 g/dL — ABNORMAL LOW (ref 3.5–5.0)
BUN: 39 mg/dL — ABNORMAL HIGH (ref 6–20)
BUN: 44 mg/dL — ABNORMAL HIGH (ref 6–20)
CALCIUM: 9.3 mg/dL (ref 8.9–10.3)
CHLORIDE: 101 mmol/L (ref 101–111)
CO2: 22 mmol/L (ref 22–32)
CO2: 24 mmol/L (ref 22–32)
Calcium: 9.2 mg/dL (ref 8.9–10.3)
Chloride: 100 mmol/L — ABNORMAL LOW (ref 101–111)
Creatinine, Ser: 4.54 mg/dL — ABNORMAL HIGH (ref 0.44–1.00)
Creatinine, Ser: 5.08 mg/dL — ABNORMAL HIGH (ref 0.44–1.00)
GFR calc Af Amer: 13 mL/min — ABNORMAL LOW (ref >60)
GFR calc Af Amer: 11 mL/min — ABNORMAL LOW (ref >60)
GFR calc non Af Amer: 11 mL/min — ABNORMAL LOW (ref >60)
GFR calc non Af Amer: 10 mL/min — ABNORMAL LOW (ref >60)
GLUCOSE: 144 mg/dL — AB (ref 65–99)
GLUCOSE: 125 mg/dL — AB (ref 65–99)
PHOSPHORUS: 4.6 mg/dL (ref 2.5–4.6)
POTASSIUM: 4.2 mmol/L (ref 3.5–5.1)
Phosphorus: 7.1 mg/dL — ABNORMAL HIGH (ref 2.5–4.6)
Potassium: 5.1 mmol/L (ref 3.5–5.1)
SODIUM: 139 mmol/L (ref 135–145)
Sodium: 138 mmol/L (ref 135–145)

## 2017-04-19 LAB — GLUCOSE, CAPILLARY
GLUCOSE-CAPILLARY: 143 mg/dL — AB (ref 65–99)
Glucose-Capillary: 123 mg/dL — ABNORMAL HIGH (ref 65–99)
Glucose-Capillary: 146 mg/dL — ABNORMAL HIGH (ref 65–99)
Glucose-Capillary: 159 mg/dL — ABNORMAL HIGH (ref 65–99)
Glucose-Capillary: 93 mg/dL (ref 65–99)
Glucose-Capillary: 94 mg/dL (ref 65–99)

## 2017-04-19 LAB — CBC WITH DIFFERENTIAL/PLATELET
BASOS PCT: 0 %
Basophils Absolute: 0 10*3/uL (ref 0.0–0.1)
EOS PCT: 1 %
Eosinophils Absolute: 0.1 10*3/uL (ref 0.0–0.7)
HCT: 23.4 % — ABNORMAL LOW (ref 36.0–46.0)
HEMOGLOBIN: 7.5 g/dL — AB (ref 12.0–15.0)
Lymphocytes Relative: 10 %
Lymphs Abs: 0.9 10*3/uL (ref 0.7–4.0)
MCH: 29 pg (ref 26.0–34.0)
MCHC: 32.1 g/dL (ref 30.0–36.0)
MCV: 90.3 fL (ref 78.0–100.0)
MONOS PCT: 7 %
Monocytes Absolute: 0.7 10*3/uL (ref 0.1–1.0)
NEUTROS PCT: 81 %
Neutro Abs: 7.3 10*3/uL (ref 1.7–7.7)
PLATELETS: 199 10*3/uL (ref 150–400)
RBC: 2.59 MIL/uL — ABNORMAL LOW (ref 3.87–5.11)
RDW: 18.2 % — ABNORMAL HIGH (ref 11.5–15.5)
WBC: 9 10*3/uL (ref 4.0–10.5)

## 2017-04-19 LAB — MAGNESIUM
Magnesium: 2.3 mg/dL (ref 1.7–2.4)
Magnesium: 2.6 mg/dL — ABNORMAL HIGH (ref 1.7–2.4)

## 2017-04-19 LAB — HEMOGLOBIN AND HEMATOCRIT, BLOOD
HCT: 23 % — ABNORMAL LOW (ref 36.0–46.0)
Hemoglobin: 7.3 g/dL — ABNORMAL LOW (ref 12.0–15.0)

## 2017-04-19 MED ORDER — LIDOCAINE HCL (PF) 1 % IJ SOLN: 5.0000 mL | INTRAMUSCULAR | Status: DC | PRN

## 2017-04-19 MED ORDER — SODIUM CHLORIDE 0.9 % IV SOLN: 100.0000 mL | INTRAVENOUS | Status: DC | PRN

## 2017-04-19 MED ORDER — LIDOCAINE-PRILOCAINE 2.5-2.5 % EX CREA
1.0000 "application " | TOPICAL_CREAM | CUTANEOUS | Status: DC | PRN
  Filled 2017-04-19: qty 5

## 2017-04-19 MED ORDER — PENTAFLUOROPROP-TETRAFLUOROETH EX AERO: 1.0000 "application " | INHALATION_SPRAY | CUTANEOUS | Status: DC | PRN

## 2017-04-19 MED ORDER — DOCUSATE SODIUM 50 MG/5ML PO LIQD
100.0000 mg | Freq: Two times a day (BID) | ORAL | Status: DC
  Administered 2017-04-19 – 2017-04-27 (×12): 100 mg
  Filled 2017-04-19 (×14): qty 10

## 2017-04-19 MED ORDER — PANTOPRAZOLE SODIUM 40 MG IV SOLR
40.0000 mg | INTRAVENOUS | Status: DC
  Administered 2017-04-19 – 2017-04-20 (×2): 40 mg via INTRAVENOUS
  Filled 2017-04-19 (×2): qty 40

## 2017-04-19 MED ORDER — HEPARIN SODIUM (PORCINE) 1000 UNIT/ML DIALYSIS: 1000.0000 [IU] | INTRAMUSCULAR | Status: DC | PRN

## 2017-04-19 MED ORDER — ALTEPLASE 2 MG IJ SOLR: 2.0000 mg | Freq: Once | INTRAMUSCULAR | Status: DC | PRN

## 2017-04-19 MED ORDER — SENNOSIDES 8.8 MG/5ML PO SYRP
5.0000 mL | ORAL_SOLUTION | Freq: Two times a day (BID) | ORAL | Status: DC
  Administered 2017-04-19 – 2017-04-27 (×12): 5 mL
  Filled 2017-04-19 (×15): qty 5

## 2017-04-19 MED ORDER — ACETAMINOPHEN 160 MG/5ML PO SOLN
650.0000 mg | Freq: Four times a day (QID) | ORAL | Status: DC | PRN
  Administered 2017-04-20 (×4): 650 mg
  Filled 2017-04-19 (×5): qty 20.3

## 2017-04-19 NOTE — Progress Notes (Signed)
Subjective:  Stable from yesterday- no etiology yet found for PEA arrest yest- CTA did not show PE or evidence of intestinal ischemia or blockage- on cleviprex again  Objective Vital signs in last 24 hours: Vitals:   04/19/17 0530 04/19/17 0600 04/19/17 0630 04/19/17 0700  BP: (!) 170/75 (!) 175/80 (!) 181/83 (!) 171/77  Pulse: 84 88 86 84  Resp: (!) 24 (!) 24 20 (!) 21  Temp:      TempSrc:      SpO2: 100% 100% 100% 100%  Weight:       Weight change: -0.2 kg (-7.1 oz)  Intake/Output Summary (Last 24 hours) at 04/19/2017 0754 Last data filed at 04/19/2017 0700 Gross per 24 hour  Intake 1028.43 ml  Output 285 ml  Net 743.43 ml    Dialyzes at [place] [facility] (not getting there). MWF, 4 hours and 15 min HD Bath 2K/2.5 calc , Dialyzer opti 180, Heparin yes, 2000. Access left lower arm AVF. Profile #2-  hect 7 mircera 225 - got on 11/14 Last hgb 9.3, calc 8.7 , phos 9.0, pth 228  Also on sensipar 60 and ayryxia  Noted to be hypertensive as OP with 5 listed BP meds ?       Assessment/Plan: 41 year old WF with multiple medical issues "vasculopath" admitted for elective management of PAD - developed severe hypotension and metabolic acidosis in the OR- req CPR, pressors  1 PAD- almost completed invasive management of PAD per vascular- still in need of transmet amp 2. Acute decompensation- now times 2- in OR and on 41/96- acute metabolic acidosis and hemodynamic collapse.  So far responded to medical therapy in ICU-   No exact etiology found for events. On vanc and flagyl - now levaquin and flagyl. Low grade temp overnight   3 ESRD: normally MWF via AVF- got OP treatment on 11/20.  Req CRRT for instability here- was on it from Wednesday after noon to 10:30 PM Thursday. Is stable for IHD now, Will try to do treatment before Monday 3 Hypertension: usually very hypertensive- req pressors transiently -now on cleviprex  until able to take PO meds-  4. Anemia of ESRD: hgb 8's>>7.5- sounds like is on  max ESA and most recent OP hgb was 9- cont supportive care and transfuse as needed - prob will drop with procedures  5. Metabolic Bone Disease: will resume sensipar and binders when eating- hold vit D for now as well       [name] A    Labs: Basic Metabolic Panel: Recent Labs  Lab 04/18/17 1000 04/18/17 1610 04/19/17 0356  NA 139 140 138  K 4.6 4.3 4.2  CL 101 102 101  CO2 _0 GLUCOSE 186* 144* 144*  BUN 31* 32* 39*  CREATININE 3.68* 3.89* 4.54*  CALCIUM 9.6 9.5 9.2  PHOS 5.7* 4.8* 4.6   Liver Function Tests: Recent Labs  Lab 04/15/17 1356  04/18/17 0635 04/18/17 1000 04/18/17 1610 04/19/17 0356  AST 20  --  290*  --   --   --   ALT 15  --  140*  --   --   --   ALKPHOS 449*  --  281*  --   --   --   BILITOT 0.7  --  1.5*  --   --   --   PROT 7.0  --  5.8*  --   --   --   ALBUMIN 2.8*   < > 2.2*  2.2* 2.3* 2.2*   < > = values in this interval not displayed.   No results for input(s): LIPASE, AMYLASE in the last 168 hours. No results for input(s): AMMONIA in the last 168 hours. CBC: Recent Labs  Lab 04/16/17 2325 04/17/17 0451 04/18/17 0445 04/18/17 0635 04/19/17 0356  WBC 8.9 8.0 11.9* 16.9* 9.0  NEUTROABS  --   --  9.1*  --  7.3  HGB 8.2* 8.3* 8.4* 9.3* 7.5*  HCT 25.2* 26.7* 27.9* 32.0* 23.4*  MCV 90.6 91.4 94.9 98.5 90.3  PLT 174 197 238 239 199   Cardiac Enzymes: Recent Labs  Lab 04/18/17 0635  TROPONINI 0.67*   CBG: Recent Labs  Lab 04/18/17 1155 04/18/17 1608 04/18/17 1956 04/18/17 2306 04/19/17 0405  GLUCAP 178* 101* 84 87 146*    Iron Studies: No results for input(s): IRON, TIBC, TRANSFERRIN, FERRITIN in the last 72 hours. Studies/Results: Ct Angio Chest Pe W Or Wo Contrast  Result Date: 04/18/2017 CLINICAL DATA:  41 year old female with increasing shortness of breath and increasing lactic acid level. Status post femoral endarterectomy on the right side followed by sudden cardiac arrest. Evaluate for abdominal  vascular injury versus mesenteric ischemia. EXAM: CT ANGIOGRAPHY CHEST, ABDOMEN AND PELVIS TECHNIQUE: Multidetector CT imaging of the chest was performed using the standard protocol during bolus administration of intravenous contrast. Multiplanar CT image reconstructions and MIPs were obtained to evaluate the vascular anatomy. Multidetector CT imaging of the abdomen and pelvis was performed using the standard protocol during bolus administration of intravenous contrast. CONTRAST:  128m ISOVUE-370 IOPAMIDOL (ISOVUE-370) INJECTION 76% COMPARISON:  CT the abdomen and pelvis 02/15/2016. Chest CT 10/31/2014. FINDINGS: Comment: Although study protocol was for a PE protocol chest CTA and CTA abdomen and pelvis, the CTA of the abdomen and pelvis is suboptimal secondary to poor contrast opacification of the abdominal arteries, and is accordingly considered nondiagnostic in terms of CTA, but considered to be a diagnostic contrast enhanced CT of the abdomen and pelvis. CTA CHEST FINDINGS Cardiovascular: There are no filling defects within the pulmonary arterial tree to suggest underlying pulmonary embolism. Heart size is mildly enlarged with probable concentric left ventricular hypertrophy. There is no significant pericardial fluid, thickening or pericardial calcification. There is aortic atherosclerosis, as well as atherosclerosis of the great vessels of the mediastinum and the coronary arteries, including calcified atherosclerotic plaque in the left main, left anterior descending, left circumflex and right coronary arteries. Dilatation of the pulmonic trunk (3.7 cm in diameter), concerning for pulmonary arterial hypertension. Mediastinum/Nodes: No pathologically enlarged mediastinal or hilar lymph nodes. Esophagus is unremarkable in appearance. No axillary lymphadenopathy. Endotracheal tube in position with tip terminating 5.2 cm above the carina. Lungs/Pleura: Patchy multifocal mild ground-glass attenuation scattered  throughout the lungs bilaterally, with some mild interlobular septal thickening, suggesting a background of very mild interstitial pulmonary edema. Some patchy peribronchovascular ground-glass attenuation is also noted dependently in the left upper lobe and right lower lobe, which could reflect sequela of mild aspiration. Dependent areas of subsegmental atelectasis are also noted in the lower lobes of the lungs bilaterally. Musculoskeletal: Multiple bilateral rib fractures are noted including the anterior aspects of the right second rib, anterior third ribs bilaterally, in the anterior right fifth rib. There are no aggressive appearing lytic or blastic lesions noted in the visualized portions of the skeleton. Review of the MIP images confirms the above findings. CTA ABDOMEN AND PELVIS FINDINGS Hepatobiliary: No cystic or solid hepatic lesions. No intra or extrahepatic biliary ductal dilatation. Amorphous  high attenuation material lying dependently in the gallbladder may reflect vicarious excretion of contrast material related to recent vascular intervention, or could reflect biliary sludge. No findings to suggest an acute cholecystitis at this time. Pancreas: No pancreatic mass. No pancreatic ductal dilatation. No definite peripancreatic inflammatory changes. Spleen: Unremarkable. Adrenals/Urinary Tract: Severe atrophy of the kidneys bilaterally which are remarkable for extensive vascular calcifications. 2.2 cm simple cyst in the lower pole of the left kidney. Bilateral adrenal glands are normal in appearance. No hydroureteronephrosis. Urinary bladder is nearly completely decompressed, but otherwise unremarkable in appearance. Stomach/Bowel: Normal appearance of the stomach. No pathologic dilatation of small bowel or colon. No focal areas of mural thickening. No definite regions of lack of mucosal enhancement confidently identified on today's examination. Normal appendix. Vascular/Lymphatic: Aortic atherosclerosis.  Although poorly evaluated related to suboptimal contrast bolus, the abdominal aorta, proximal celiac axis, and proximal superior mesenteric artery appear grossly patent. Inferior mesenteric artery is too small in caliber to accurately assess on today's suboptimal examination. No definite aneurysm or dissection noted in the abdominal or pelvic vasculature. 3.8 x 2.9 cm high attenuation collection adjacent to the right superficial femoral artery (axial image 108 of series 12), potentially a hematoma related to recent vascular access. Left femoral central venous catheter with tip terminating in the proximal left external iliac vein. No lymphadenopathy noted in the abdomen or pelvis. Reproductive: Uterus and ovaries are unremarkable in appearance. Other: Moderate to large volume of ascites.  No pneumoperitoneum. Musculoskeletal: There are no aggressive appearing lytic or blastic lesions noted in the visualized portions of the skeleton. Review of the MIP images confirms the above findings. IMPRESSION: 1. No evidence of pulmonary embolism. 2. Nondiagnostic CTA of the abdomen and pelvis. Despite this limitation, the proximal aspects of the celiac axis and superior mesenteric artery both appear patent. No definite findings in the bowel to strongly suggest ischemia at this time. 3. Probable hematoma adjacent to the right superficial femoral artery measuring 3.8 x 2.9 cm. No hypervascular blush to clearly indicate pseudoaneurysm on today's examination. 4. Moderate to large volume of ascites. 5. Probable trace interstitial pulmonary edema in the lungs. 6. Cardiomegaly with concentric left ventricular hypertrophy. 7. Aortic atherosclerosis, in addition to left main and 3 vessel coronary artery disease. Please note that although the presence of coronary artery calcium documents the presence of coronary artery disease, the severity of this disease and any potential stenosis cannot be assessed on this non-gated CT examination.  Assessment for potential risk factor modification, dietary therapy or pharmacologic therapy may be warranted, if clinically indicated. 8. Additional incidental findings, as above. Electronically Signed   By: Vinnie Langton M.D.   On: 04/18/2017 16:15   Dg Chest Port 1 View  Result Date: 04/18/2017 CLINICAL DATA:  41 year old female status post cardiac arrest. 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 EXAM: PORTABLE CHEST 1 VIEW COMPARISON:  04/17/2017 and earlier. FINDINGS: Portable AP supine view at 0643 hours. Endotracheal tube tip in good position at the level the clavicles. Enteric tube removed. Other curvilinear objects projecting over the mediastinum and central diaphragm are presumed to be external. Pacer/resuscitation pads are in place. Stable cardiomegaly and mediastinal contours. Continued increased bilateral interstitial opacity, mildly progressed since yesterday. Stable more confluent infrahilar opacity. No pleural effusion or pneumothorax. No consolidation. IMPRESSION: 1. Increased interstitial edema since yesterday. Stable cardiomegaly. No consolidation or pleural effusion is evident. 2. Enteric tube removed.  ET tube  in good position. Electronically Signed   By: Genevie Ann M.D.   On: 04/18/2017 07:07   Dg Chest Port 1 View  Result Date: 04/17/2017 CLINICAL DATA:  Hypoxia EXAM: PORTABLE CHEST 1 VIEW COMPARISON:  April 16, 2017 FINDINGS: Endotracheal tube tip is 6.0 cm above the carina. Nasogastric tube tip and side port are below the diaphragm. No pneumothorax. There is a small right pleural effusion. No edema or consolidation. There is cardiomegaly with pulmonary vascularity within normal limits. No adenopathy. There is aortic atherosclerosis. No evident bone lesions. IMPRESSION: Tube positions as described without pneumothorax. Stable cardiomegaly. Small right pleural effusion. No edema or  consolidation. There is aortic atherosclerosis. Aortic Atherosclerosis (ICD10-I70.0). Electronically Signed   By: Lowella Grip III M.D.   On: 04/17/2017 09:13   Ct Angio Abd/pel W/ And/or W/o  Result Date: 04/18/2017 CLINICAL DATA:  41 year old female with increasing shortness of breath and increasing lactic acid level. Status post femoral endarterectomy on the right side followed by sudden cardiac arrest. Evaluate for abdominal vascular injury versus mesenteric ischemia. EXAM: CT ANGIOGRAPHY CHEST, ABDOMEN AND PELVIS TECHNIQUE: Multidetector CT imaging of the chest was performed using the standard protocol during bolus administration of intravenous contrast. Multiplanar CT image reconstructions and MIPs were obtained to evaluate the vascular anatomy. Multidetector CT imaging of the abdomen and pelvis was performed using the standard protocol during bolus administration of intravenous contrast. CONTRAST:  15m ISOVUE-370 IOPAMIDOL (ISOVUE-370) INJECTION 76% COMPARISON:  CT the abdomen and pelvis 02/15/2016. Chest CT 10/31/2014. FINDINGS: Comment: Although study protocol was for a PE protocol chest CTA and CTA abdomen and pelvis, the CTA of the abdomen and pelvis is suboptimal secondary to poor contrast opacification of the abdominal arteries, and is accordingly considered nondiagnostic in terms of CTA, but considered to be a diagnostic contrast enhanced CT of the abdomen and pelvis. CTA CHEST FINDINGS Cardiovascular: There are no filling defects within the pulmonary arterial tree to suggest underlying pulmonary embolism. Heart size is mildly enlarged with probable concentric left ventricular hypertrophy. There is no significant pericardial fluid, thickening or pericardial calcification. There is aortic atherosclerosis, as well as atherosclerosis of the great vessels of the mediastinum and the coronary arteries, including calcified atherosclerotic plaque in the left main, left anterior descending, left  circumflex and right coronary arteries. Dilatation of the pulmonic trunk (3.7 cm in diameter), concerning for pulmonary arterial hypertension. Mediastinum/Nodes: No pathologically enlarged mediastinal or hilar lymph nodes. Esophagus is unremarkable in appearance. No axillary lymphadenopathy. Endotracheal tube in position with tip terminating 5.2 cm above the carina. Lungs/Pleura: Patchy multifocal mild ground-glass attenuation scattered throughout the lungs bilaterally, with some mild interlobular septal thickening, suggesting a background of very mild interstitial pulmonary edema. Some patchy peribronchovascular ground-glass attenuation is also noted dependently in the left upper lobe and right lower lobe, which could reflect sequela of mild aspiration. Dependent areas of subsegmental atelectasis are also noted in the lower lobes of the lungs bilaterally. Musculoskeletal: Multiple bilateral rib fractures are noted including the anterior aspects of the right second rib, anterior third ribs bilaterally, in the anterior right fifth rib. There are no aggressive appearing lytic or blastic lesions noted in the visualized portions of the skeleton. Review of the MIP images confirms the above findings. CTA ABDOMEN AND PELVIS FINDINGS Hepatobiliary: No cystic or solid hepatic lesions. No intra or extrahepatic biliary ductal dilatation. Amorphous high attenuation material lying dependently in the gallbladder may reflect vicarious excretion of contrast material related to recent vascular intervention, or could reflect  biliary sludge. No findings to suggest an acute cholecystitis at this time. Pancreas: No pancreatic mass. No pancreatic ductal dilatation. No definite peripancreatic inflammatory changes. Spleen: Unremarkable. Adrenals/Urinary Tract: Severe atrophy of the kidneys bilaterally which are remarkable for extensive vascular calcifications. 2.2 cm simple cyst in the lower pole of the left kidney. Bilateral adrenal  glands are normal in appearance. No hydroureteronephrosis. Urinary bladder is nearly completely decompressed, but otherwise unremarkable in appearance. Stomach/Bowel: Normal appearance of the stomach. No pathologic dilatation of small bowel or colon. No focal areas of mural thickening. No definite regions of lack of mucosal enhancement confidently identified on today's examination. Normal appendix. Vascular/Lymphatic: Aortic atherosclerosis. Although poorly evaluated related to suboptimal contrast bolus, the abdominal aorta, proximal celiac axis, and proximal superior mesenteric artery appear grossly patent. Inferior mesenteric artery is too small in caliber to accurately assess on today's suboptimal examination. No definite aneurysm or dissection noted in the abdominal or pelvic vasculature. 3.8 x 2.9 cm high attenuation collection adjacent to the right superficial femoral artery (axial image 108 of series 12), potentially a hematoma related to recent vascular access. Left femoral central venous catheter with tip terminating in the proximal left external iliac vein. No lymphadenopathy noted in the abdomen or pelvis. Reproductive: Uterus and ovaries are unremarkable in appearance. Other: Moderate to large volume of ascites.  No pneumoperitoneum. Musculoskeletal: There are no aggressive appearing lytic or blastic lesions noted in the visualized portions of the skeleton. Review of the MIP images confirms the above findings. IMPRESSION: 1. No evidence of pulmonary embolism. 2. Nondiagnostic CTA of the abdomen and pelvis. Despite this limitation, the proximal aspects of the celiac axis and superior mesenteric artery both appear patent. No definite findings in the bowel to strongly suggest ischemia at this time. 3. Probable hematoma adjacent to the right superficial femoral artery measuring 3.8 x 2.9 cm. No hypervascular blush to clearly indicate pseudoaneurysm on today's examination. 4. Moderate to large volume of  ascites. 5. Probable trace interstitial pulmonary edema in the lungs. 6. Cardiomegaly with concentric left ventricular hypertrophy. 7. Aortic atherosclerosis, in addition to left main and 3 vessel coronary artery disease. Please note that although the presence of coronary artery calcium documents the presence of coronary artery disease, the severity of this disease and any potential stenosis cannot be assessed on this non-gated CT examination. Assessment for potential risk factor modification, dietary therapy or pharmacologic therapy may be warranted, if clinically indicated. 8. Additional incidental findings, as above. Electronically Signed   By: Vinnie Langton M.D.   On: 04/18/2017 16:15   Medications: Infusions: . clevidipine 9 mg/hr (04/19/17 0645)  . fentaNYL infusion INTRAVENOUS 200 mcg/hr (04/19/17 0229)  . levofloxacin (LEVAQUIN) IV Stopped (04/18/17 1902)  . metronidazole Stopped (04/19/17 0132)  . dialysis replacement fluid (prismasate) 500 mL/hr at 04/17/17 1407  . dialysis replacement fluid (prismasate) 500 mL/hr at 04/17/17 1411  . dialysate (PRISMASATE) 1,500 mL/hr at 04/17/17 1746  . sodium chloride      Scheduled Medications: . amLODipine  10 mg Oral Daily  . chlorhexidine gluconate (MEDLINE KIT)  15 mL Mouth Rinse BID  . Chlorhexidine Gluconate Cloth  6 each Topical Daily  . cloNIDine  0.3 mg Oral TID  . heparin  5,000 Units Subcutaneous Q8H  . insulin aspart  2-6 Units Subcutaneous Q4H  . mouth rinse  15 mL Mouth Rinse BID  . mouth rinse  15 mL Mouth Rinse QID  . sodium chloride flush  10-40 mL Intracatheter Q12H    have  reviewed scheduled and prn medications.  Physical Exam: General: awake alert, wants tube out  Heart: RRR Lungs: mostly clear Abdomen: distended Extremities: min edema- ischemic changes Dialysis Access: left groin trialysis cath and left AVF patent     04/19/2017,7:54 AM  LOS: 3 days

## 2017-04-19 NOTE — Anesthesia Postprocedure Evaluation (Signed)
Anesthesia Post Note  Patient: [name]  Procedure(s) Performed: RIGHT FEMORAL ENDARTERECTOMY (Right Groin) right LOWER EXTREMITY ANGIOGRAM (Right Groin) PATCH ANGIOPLASTY OF RIGHT FEMORAL ARTERY USING BOVINE PERICARDIUM PATCH (Right Groin) INSERTION of temporay DIALYSIS CATHETER, left femoral artery (Left Groin) ANGIOGRAM PULMONARY ([name] Chest)     Patient location during evaluation: ICU Anesthesia Type: General Level of consciousness: patient cooperative and patient remains intubated per anesthesia plan Pain management: pain level controlled Vital Signs Assessment: post-procedure vital signs reviewed and stable Respiratory status: patient on ventilator - see flowsheet for VS Cardiovascular status: stable Postop Assessment: no apparent nausea or vomiting Anesthetic complications: no Comments: Patient with PEA arrest intraoperatively, etiology of arrest remains unclear. Several rounds of CPR intraoperatively resulted in ROSC. Intraop pulmonary angiogram did not find PE, intraop TEE not suggestive of ischemic cardiac event. Patient was transferred to ICU intubated once stabilized in OR for transport. Remains intubated in ICU, but following commands.    Last Vitals:  Vitals:   04/19/17 0830 04/19/17 0907  BP: (!) 173/83   Pulse: 95   Resp: 18   Temp:  37.7 C  SpO2: 93%     Last Pain:  Vitals:   04/19/17 0907  TempSrc: Oral  PainSc:                  Audry Pili

## 2017-04-19 NOTE — Progress Notes (Signed)
   VASCULAR SURGERY ASSESSMENT & PLAN:   3 Days Post-Op s/p: Endarterectomy of right common femoral artery.  Doing much better today after events of yesterday in which she arrested.  Hopefully she can be extubated today.  Feet appear adequately perfused with good Doppler flow.  JP is draining lymphatic fluid so we'll leave this in place.   SUBJECTIVE:   Alert and anxious to have a breathing tube taken out  PHYSICAL EXAM:   Vitals:   04/19/17 0457 04/19/17 0500 04/19/17 0530 04/19/17 0600  BP:  (!) 178/93 (!) 170/75 (!) 175/80  Pulse:  86 84 88  Resp:  (!) 24 (!) 24 (!) 24  Temp:      TempSrc:      SpO2:  99% 100% 100%  Weight: 212 lb 11.9 oz (96.5 kg)      Good Doppler flow in both feet. Toes on the right foot are unchanged. Lungs clear.  LABS:   Lab Results  Component Value Date   WBC 9.0 04/19/2017   HGB 7.5 (L) 04/19/2017   HCT 23.4 (L) 04/19/2017   MCV 90.3 04/19/2017   PLT 199 04/19/2017   Lab Results  Component Value Date   CREATININE 4.54 (H) 04/19/2017   Lab Results  Component Value Date   INR 1.31 04/18/2017   CBG (last 3)  Recent Labs    04/18/17 1956 04/18/17 2306 04/19/17 0405  GLUCAP 84 87 146*    PROBLEM LIST:    Active Problems:   PVD (peripheral vascular disease) (HCC)   Cardiac arrest, cause unspecified (HCC)   CURRENT MEDS:   . amLODipine  10 mg Oral Daily  . chlorhexidine gluconate (MEDLINE KIT)  15 mL Mouth Rinse BID  . Chlorhexidine Gluconate Cloth  6 each Topical Daily  . cloNIDine  0.3 mg Oral TID  . heparin  5,000 Units Subcutaneous Q8H  . insulin aspart  2-6 Units Subcutaneous Q4H  . mouth rinse  15 mL Mouth Rinse BID  . mouth rinse  15 mL Mouth Rinse QID  . sodium chloride flush  10-40 mL Intracatheter Q12H    Deitra Mayo Beeper: [phone] Office: [phone] 04/19/2017

## 2017-04-19 NOTE — Progress Notes (Addendum)
Subjective:  Intubated Following commands Nods "no" to chest pain, breathing difficulty, abdominal pain.  Objective:  Vital Signs in the last 24 hours: Temp:  [98.8 F (37.1 C)-100.4 F (38 C)] 99.9 F (37.7 C) (11/24 0907) Pulse Rate:  [79-95] 95 (11/24 0830) Resp:  [8-26] 18 (11/24 0830) BP: (164-233)/(61-142) 173/83 (11/24 0830) SpO2:  [91 %-100 %] 93 % (11/24 0830) Arterial Line BP: (155-237)/(21-87) 189/68 (11/24 0830) FiO2 (%):  [40 %-65 %] 40 % (11/24 0730) Weight:  [96.5 kg (212 lb 11.9 oz)] 96.5 kg (212 lb 11.9 oz) (11/24 0457)  Intake/Output from previous day: 11/23 0701 - 11/24 0700 In: 1028.4 [I.V.:628.4; IV Piggyback:400] Out: 285 [Drains:285] Intake/Output from this shift: Total I/O In: 33 [I.V.:33] Out: 85 [Drains:85]  Physical Exam: Nursing note and vitals reviewed. Constitutional: She appears well-developed.  Sick appearing  HENT:  Head: Normocephalic and atraumatic.  Neck: No JVD present.  Cardiovascular: Normal rate and regular rhythm. II/VI holosystolic murmur apex Dressing intact. JP drain in place. Warm RLE.  Gangrenous 3rd and 5th toe RLE  Respiratory:  Mechanical ventilation. Coarse breath sounds.  GI: Soft. Bowel sounds are normal. There is no tenderness.  Musculoskeletal: She exhibits no edema.  Neurological: She is alert.  Able to follow commands.     Lab Results: Recent Labs    04/18/17 0635 04/19/17 0356  WBC 16.9* 9.0  HGB 9.3* 7.5*  PLT 239 199   Recent Labs    04/18/17 1610 04/19/17 0356  NA 140 138  K 4.3 4.2  CL 102 101  CO2 27 22  GLUCOSE 144* 144*  BUN 32* 39*  CREATININE 3.89* 4.54*   Recent Labs    04/18/17 0635  TROPONINI 0.67*   Hepatic Function Panel Recent Labs    04/18/17 0635  04/19/17 0356  PROT 5.8*  --   --   ALBUMIN 2.2*   < > 2.2*  AST 290*  --   --   ALT 140*  --   --   ALKPHOS 281*  --   --   BILITOT 1.5*  --   --    < > = values in this interval not displayed.    Imaging: CXR  04/18/2017 CLINICAL DATA:  41 year old female status post cardiac arrest.  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  EXAM: PORTABLE CHEST 1 VIEW  COMPARISON:  04/17/2017 and earlier.  FINDINGS: Portable AP supine view at 0643 hours. Endotracheal tube tip in good position at the level the clavicles. Enteric tube removed. Other curvilinear objects projecting over the mediastinum and central diaphragm are presumed to be external. Pacer/resuscitation pads are in place.  Stable cardiomegaly and mediastinal contours. Continued increased bilateral interstitial opacity, mildly progressed since yesterday. Stable more confluent infrahilar opacity. No pleural effusion or pneumothorax. No consolidation.  IMPRESSION: 1. Increased interstitial edema since yesterday. Stable cardiomegaly. No consolidation or pleural effusion is evident. 2. Enteric tube removed.  ET tube in good position.  Cardiac Studies:   EKG 04/18/2017: Sinus rhythm at 94 bpm. Left axis deviation. Poor R-wave progression nonspecific ST changes lateral leads. No signs of ischemia.   Echocardiogram 04/18/2017 Study Conclusions  - Left ventricle: Moderate concentric hypertrophy. Wall motion was   normal; there were no regional wall motion abnormalities. Doppler   parameters are consistent with elevated mean left atrial filling   pressure. Grade II diastolic dysfunction. - Aortic valve: There was mild stenosis. - Right  ventricle: The cavity size was mildly dilated. - Tricuspid valve: There was moderate regurgitation. - Pulmonary arteries: PA peak pressure: 59 mm Hg (S). - Pericardium, extracardiac: A small pericardial effusion was   identified posterior to the heart.  Impressions:  - No acute etiology for cardiac arrest identified on this   echocardiogram. Overall, no significant change compared to prior    transthoracic echocardiogram in 02/17/2016.  Assessment: PEA cardiac arrest. Brief Mobitz type 2 AV block in the setting of metabolic acidosis. Currently in sinus rhythm 1:1 conduction. No significant IVCD. Mild troponin elevation, likely supply demand mismatch status post cardiac arrest. No signs of acute ischemia. Lactic acidosis, improving Status post recent ileofemoral endarterectomy Moderate CAD Cath 2016 Uncontrolled hypertension Uncontrolled type 1 diabetes mellitus Heart failure with preserved ejection fraction Pulmonary hypertension, WHO group II ESRD  Recommendations: Continue supportive management as you're doing. Currently, no indication for coronary angiogram or pacemaker. Continue current antihypertensive therapy. Consider adding aspirin. Continue statin. Rest of the management per the primary team.  Cardiology will sign off at this time. Please contact us if any questions.    LOS: 3 days    Manish J Patwardhan 04/19/2017, 9:45 AM  [place], MD [facility] Pager: [phone] Office: [phone] If no answer Cell [phone]

## 2017-04-19 NOTE — Progress Notes (Signed)
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  The patient was profoundly acidotic and once that was addressed patient had a stable rhythm and was brought to the ICU for CRRT to address profound metabolic acidosis.  Patient was unresponsive upon arrival but with stabilization of BP reportedly became responsive.  PCCM was consulted for vent and vital signs management. Cardiac arrest x2 and 48 hours suspected from profound acidotic state.   SUBJECTIVE:  Awake alert and on full mechanical ventilatory support.  She has had 2 cardiac arrests in the last 48 hours.  Hesitant to extubate her at this time.  VITAL SIGNS: BP (!) 173/83   Pulse 95   Temp 99.9 F (37.7 C) (Oral)   Resp 18   Wt 212 lb 11.9 oz (96.5 kg)   LMP 10/09/2015 Comment: pt on dialysis  SpO2 93%   BMI 25.90 kg/m   HEMODYNAMICS:    VENTILATOR SETTINGS: Vent Mode: PRVC FiO2 (%):  [40 %-65 %] 40 % Set Rate:  [24 bmp] 24 bmp Vt Set:  [151 mL] 660 mL PEEP:  [8 cmH20-10 cmH20] 8 cmH20 Plateau Pressure:  [28 VOH60-73 cmH20] 30 cmH20  INTAKE / OUTPUT: I/O last 3 completed shifts: In: 1148.4 [P.O.:20; I.V.:628.4; IV XTGGYIRSW:546] Out: 25 [Drains:475; Other:366]  PHYSICAL EXAMINATION: General: Awake alert uncomfortable mechanical HEENT: Endotracheal tube connected to ventilator will place OG tube PSY: Anxious Neuro: Anxious, and intact CV: Heart sounds are regular in rate and rhythm PULM: Decreased breath sounds in the bases GI: Abdomen mild distention positive tenderness no fluid wave Extremities:  Bilateral lower extremity with vascular changes  arterial insufficiency Skin: Warm and dry     LABS:  BMET Recent Labs  Lab 04/18/17 1000 04/18/17 1610 04/19/17 0356  NA 139 140 138  K 4.6 4.3 4.2  CL 101 102 101  CO2 25 27 22   BUN 31* 32* 39*  CREATININE 3.68* 3.89* 4.54*  GLUCOSE 186* 144* 144*    Electrolytes Recent Labs  Lab 04/18/17 0635 04/18/17 1000 04/18/17 1610 04/19/17 0356  CALCIUM 10.8* 9.6 9.5 9.2  MG 2.7*  --  2.2 2.3  PHOS 9.0* 5.7* 4.8* 4.6    CBC Recent Labs  Lab 04/18/17 0445 04/18/17 0635 04/19/17 0356  WBC 11.9* 16.9* 9.0  HGB 8.4* 9.3* 7.5*  HCT 27.9* 32.0* 23.4*  PLT 238 239 199    Coag's Recent Labs  Lab 04/15/17 1356 04/18/17 0635  APTT 39*  --   INR 1.14 1.31    Sepsis Markers Recent Labs  Lab 04/18/17 0606 04/18/17 1610  LATICACIDVEN 9.2* 1.0    ABG Recent Labs  Lab 04/17/17 1053 04/18/17 0624 04/18/17 0925  PHART 7.356 7.207* 7.486*  PCO2ART 48.1* 50.1* 33.6  PO2ART 65.0* 37.0* 268*    Liver Enzymes Recent Labs  Lab 04/15/17 1356  04/18/17 0635 04/18/17 1000 04/18/17 1610 04/19/17 0356  AST 20  --  290*  --   --   --   ALT 15  --  140*  --   --   --   ALKPHOS 449*  --  281*  --   --   --   BILITOT 0.7  --  1.5*  --   --   --   ALBUMIN 2.8*   < > 2.2* 2.2* 2.3* 2.2*   < > = values in this interval not displayed.    Cardiac Enzymes Recent Labs  Lab 04/18/17 0635  TROPONINI 0.67*    Glucose Recent Labs  Lab 04/18/17 1155 04/18/17 1608 04/18/17 1956 04/18/17 2306 04/19/17 0405 04/19/17 0905  GLUCAP 178* 101* 84 87 146* 159*    Imaging Ct Angio Chest Pe W Or Wo Contrast  Result Date: 04/18/2017 CLINICAL DATA:  41 year old female with increasing shortness of breath and increasing lactic acid level. Status post femoral endarterectomy on the right side followed by sudden cardiac arrest. Evaluate for abdominal vascular injury versus mesenteric ischemia. EXAM: CT ANGIOGRAPHY CHEST,  ABDOMEN AND PELVIS TECHNIQUE: Multidetector CT imaging of the chest was performed using the standard protocol during bolus administration of intravenous contrast. Multiplanar CT image reconstructions and MIPs were obtained to evaluate the vascular anatomy. Multidetector CT imaging of the abdomen and pelvis was performed using the standard protocol during bolus administration of intravenous contrast. CONTRAST:  147mL ISOVUE-370 IOPAMIDOL (ISOVUE-370) INJECTION 76% COMPARISON:  CT the abdomen and pelvis 02/15/2016. Chest CT 10/31/2014. FINDINGS: Comment: Although study protocol was for a PE protocol chest CTA and CTA abdomen and pelvis, the CTA of the abdomen and pelvis is suboptimal secondary to poor contrast opacification of the abdominal arteries, and is accordingly considered nondiagnostic in terms of CTA, but considered to be a diagnostic contrast enhanced CT of the abdomen and pelvis. CTA CHEST FINDINGS Cardiovascular: There are no filling defects within the pulmonary arterial tree to suggest underlying pulmonary embolism. Heart size is mildly enlarged with probable concentric left ventricular hypertrophy. There is no significant pericardial fluid, thickening or pericardial calcification. There is aortic atherosclerosis, as well as atherosclerosis of the great vessels of the mediastinum and the coronary arteries, including calcified atherosclerotic plaque in the left main, left anterior descending, left circumflex and right coronary arteries. Dilatation of the pulmonic trunk (3.7 cm in diameter), concerning for pulmonary arterial hypertension. Mediastinum/Nodes: No pathologically enlarged mediastinal or hilar lymph nodes. Esophagus is unremarkable in appearance. No axillary lymphadenopathy. Endotracheal tube in position with tip terminating 5.2 cm above the carina. Lungs/Pleura: Patchy multifocal mild ground-glass attenuation scattered throughout the lungs bilaterally, with some mild interlobular septal  thickening, suggesting a background of very mild interstitial pulmonary edema. Some patchy peribronchovascular ground-glass attenuation is also noted dependently in the left upper lobe and right lower lobe, which could reflect sequela of mild aspiration. Dependent areas of subsegmental atelectasis are also noted in the lower lobes of the lungs bilaterally. Musculoskeletal: Multiple bilateral rib fractures are noted including the anterior aspects of the right second rib, anterior third ribs bilaterally, in the anterior right fifth rib. There are no aggressive appearing lytic or blastic lesions noted in the visualized portions of the skeleton. Review of the MIP images confirms the above findings. CTA ABDOMEN AND PELVIS FINDINGS Hepatobiliary: No cystic or solid hepatic lesions. No intra or extrahepatic biliary ductal dilatation. Amorphous high attenuation material lying dependently in the gallbladder may reflect vicarious excretion of contrast material related to recent vascular intervention, or could reflect biliary sludge. No findings to suggest an acute cholecystitis at this time. Pancreas: No pancreatic mass. No pancreatic ductal dilatation. No definite peripancreatic inflammatory changes. Spleen: Unremarkable. Adrenals/Urinary Tract: Severe atrophy of the kidneys bilaterally which are remarkable for  extensive vascular calcifications. 2.2 cm simple cyst in the lower pole of the left kidney. Bilateral adrenal glands are normal in appearance. No hydroureteronephrosis. Urinary bladder is nearly completely decompressed, but otherwise unremarkable in appearance. Stomach/Bowel: Normal appearance of the stomach. No pathologic dilatation of small bowel or colon. No focal areas of mural thickening. No definite regions of lack of mucosal enhancement confidently identified on today's examination. Normal appendix. Vascular/Lymphatic: Aortic atherosclerosis. Although poorly evaluated related to suboptimal contrast bolus, the  abdominal aorta, proximal celiac axis, and proximal superior mesenteric artery appear grossly patent. Inferior mesenteric artery is too small in caliber to accurately assess on today's suboptimal examination. No definite aneurysm or dissection noted in the abdominal or pelvic vasculature. 3.8 x 2.9 cm high attenuation collection adjacent to the right superficial femoral artery (axial image 108 of series 12), potentially a hematoma related to recent vascular access. Left femoral central venous catheter with tip terminating in the proximal left external iliac vein. No lymphadenopathy noted in the abdomen or pelvis. Reproductive: Uterus and ovaries are unremarkable in appearance. Other: Moderate to large volume of ascites.  No pneumoperitoneum. Musculoskeletal: There are no aggressive appearing lytic or blastic lesions noted in the visualized portions of the skeleton. Review of the MIP images confirms the above findings. IMPRESSION: 1. No evidence of pulmonary embolism. 2. Nondiagnostic CTA of the abdomen and pelvis. Despite this limitation, the proximal aspects of the celiac axis and superior mesenteric artery both appear patent. No definite findings in the bowel to strongly suggest ischemia at this time. 3. Probable hematoma adjacent to the right superficial femoral artery measuring 3.8 x 2.9 cm. No hypervascular blush to clearly indicate pseudoaneurysm on today's examination. 4. Moderate to large volume of ascites. 5. Probable trace interstitial pulmonary edema in the lungs. 6. Cardiomegaly with concentric left ventricular hypertrophy. 7. Aortic atherosclerosis, in addition to left main and 3 vessel coronary artery disease. Please note that although the presence of coronary artery calcium documents the presence of coronary artery disease, the severity of this disease and any potential stenosis cannot be assessed on this non-gated CT examination. Assessment for potential risk factor modification, dietary therapy or  pharmacologic therapy may be warranted, if clinically indicated. 8. Additional incidental findings, as above. Electronically Signed   By: Vinnie Langton M.D.   On: 04/18/2017 16:15   Ct Angio Abd/pel W/ And/or W/o  Result Date: 04/18/2017 CLINICAL DATA:  41 year old female with increasing shortness of breath and increasing lactic acid level. Status post femoral endarterectomy on the right side followed by sudden cardiac arrest. Evaluate for abdominal vascular injury versus mesenteric ischemia. EXAM: CT ANGIOGRAPHY CHEST, ABDOMEN AND PELVIS TECHNIQUE: Multidetector CT imaging of the chest was performed using the standard protocol during bolus administration of intravenous contrast. Multiplanar CT image reconstructions and MIPs were obtained to evaluate the vascular anatomy. Multidetector CT imaging of the abdomen and pelvis was performed using the standard protocol during bolus administration of intravenous contrast. CONTRAST:  148mL ISOVUE-370 IOPAMIDOL (ISOVUE-370) INJECTION 76% COMPARISON:  CT the abdomen and pelvis 02/15/2016. Chest CT 10/31/2014. FINDINGS: Comment: Although study protocol was for a PE protocol chest CTA and CTA abdomen and pelvis, the CTA of the abdomen and pelvis is suboptimal secondary to poor contrast opacification of the abdominal arteries, and is accordingly considered nondiagnostic in terms of CTA, but considered to be a diagnostic contrast enhanced CT of the abdomen and pelvis. CTA CHEST FINDINGS Cardiovascular: There are no filling defects within the pulmonary arterial tree to suggest underlying pulmonary  embolism. Heart size is mildly enlarged with probable concentric left ventricular hypertrophy. There is no significant pericardial fluid, thickening or pericardial calcification. There is aortic atherosclerosis, as well as atherosclerosis of the great vessels of the mediastinum and the coronary arteries, including calcified atherosclerotic plaque in the left main, left anterior  descending, left circumflex and right coronary arteries. Dilatation of the pulmonic trunk (3.7 cm in diameter), concerning for pulmonary arterial hypertension. Mediastinum/Nodes: No pathologically enlarged mediastinal or hilar lymph nodes. Esophagus is unremarkable in appearance. No axillary lymphadenopathy. Endotracheal tube in position with tip terminating 5.2 cm above the carina. Lungs/Pleura: Patchy multifocal mild ground-glass attenuation scattered throughout the lungs bilaterally, with some mild interlobular septal thickening, suggesting a background of very mild interstitial pulmonary edema. Some patchy peribronchovascular ground-glass attenuation is also noted dependently in the left upper lobe and right lower lobe, which could reflect sequela of mild aspiration. Dependent areas of subsegmental atelectasis are also noted in the lower lobes of the lungs bilaterally. Musculoskeletal: Multiple bilateral rib fractures are noted including the anterior aspects of the right second rib, anterior third ribs bilaterally, in the anterior right fifth rib. There are no aggressive appearing lytic or blastic lesions noted in the visualized portions of the skeleton. Review of the MIP images confirms the above findings. CTA ABDOMEN AND PELVIS FINDINGS Hepatobiliary: No cystic or solid hepatic lesions. No intra or extrahepatic biliary ductal dilatation. Amorphous high attenuation material lying dependently in the gallbladder may reflect vicarious excretion of contrast material related to recent vascular intervention, or could reflect biliary sludge. No findings to suggest an acute cholecystitis at this time. Pancreas: No pancreatic mass. No pancreatic ductal dilatation. No definite peripancreatic inflammatory changes. Spleen: Unremarkable. Adrenals/Urinary Tract: Severe atrophy of the kidneys bilaterally which are remarkable for extensive vascular calcifications. 2.2 cm simple cyst in the lower pole of the left kidney.  Bilateral adrenal glands are normal in appearance. No hydroureteronephrosis. Urinary bladder is nearly completely decompressed, but otherwise unremarkable in appearance. Stomach/Bowel: Normal appearance of the stomach. No pathologic dilatation of small bowel or colon. No focal areas of mural thickening. No definite regions of lack of mucosal enhancement confidently identified on today's examination. Normal appendix. Vascular/Lymphatic: Aortic atherosclerosis. Although poorly evaluated related to suboptimal contrast bolus, the abdominal aorta, proximal celiac axis, and proximal superior mesenteric artery appear grossly patent. Inferior mesenteric artery is too small in caliber to accurately assess on today's suboptimal examination. No definite aneurysm or dissection noted in the abdominal or pelvic vasculature. 3.8 x 2.9 cm high attenuation collection adjacent to the right superficial femoral artery (axial image 108 of series 12), potentially a hematoma related to recent vascular access. Left femoral central venous catheter with tip terminating in the proximal left external iliac vein. No lymphadenopathy noted in the abdomen or pelvis. Reproductive: Uterus and ovaries are unremarkable in appearance. Other: Moderate to large volume of ascites.  No pneumoperitoneum. Musculoskeletal: There are no aggressive appearing lytic or blastic lesions noted in the visualized portions of the skeleton. Review of the MIP images confirms the above findings. IMPRESSION: 1. No evidence of pulmonary embolism. 2. Nondiagnostic CTA of the abdomen and pelvis. Despite this limitation, the proximal aspects of the celiac axis and superior mesenteric artery both appear patent. No definite findings in the bowel to strongly suggest ischemia at this time. 3. Probable hematoma adjacent to the right superficial femoral artery measuring 3.8 x 2.9 cm. No hypervascular blush to clearly indicate pseudoaneurysm on today's examination. 4. Moderate to  large volume of ascites.  5. Probable trace interstitial pulmonary edema in the lungs. 6. Cardiomegaly with concentric left ventricular hypertrophy. 7. Aortic atherosclerosis, in addition to left main and 3 vessel coronary artery disease. Please note that although the presence of coronary artery calcium documents the presence of coronary artery disease, the severity of this disease and any potential stenosis cannot be assessed on this non-gated CT examination. Assessment for potential risk factor modification, dietary therapy or pharmacologic therapy may be warranted, if clinically indicated. 8. Additional incidental findings, as above. Electronically Signed   By: Vinnie Langton M.D.   On: 04/18/2017 16:15     STUDIES:  Abdominal/chest CT 11/21>>> no evidence of pulmonary embolism.  Nondiagnostic CT of the abdomen and pelvis.  Probable hematoma in the right superficial femoral Artery.  Moderate to large volume this ascites Head CT 11/21>>> not done  CULTURES: Blood 11/21>>>  ANTIBIOTICS: Levaquin 11/21>>> Flagyl 11/21>>>  SIGNIFICANT EVENTS:  11/21>>> Cardiac arrest in OR 04/18/2017>> second cardiac arrest requiring intubation and 30 minutes of CPR  LINES/TUBES: ETT 11/21>>> L femoral HD catheter 11/21>>> R radial a-line 11/21>>>  DISCUSSION: 41 year old type I diabetic with all the classic complications with cardiac arrest in the OR for 2 hours that is now following commands so no need for cooling.  Rapidly coming off pressors with addressing of acidosis.  Cause of arrest I suspect is acidosis after reperfusion in a patient with no renal failure (ESRD) vs ischemic bowel.  04/17/2017 awake and alert on minimal ventilator settings we will assess for extubation. 04/18/2017 she had a cardiac arrest requiring intubation and CPR for approximately 30 minutes.  She woke up was neurologically intact extensive chest pain following chest compressions x2 to continuous day.  ASSESSMENT /  PLAN:  PULMONARY A: ARDS and VDRF post CPR for 2 hours P:   - Full vent support -04/17/2017 extubated and subsequently reintubated 04/19/1999.  Following a cardiac arrest -Follow ABG for completeness -CT angios of chest did not demonstrate pulmonary embolism. -Most likely will not extubate today we will let her undergo hemodialysis.  Continue to monitor for metabolic acidosis.  CARDIOVASCULAR A:  Circulatory Shock, unknown cause, resolving with resolution of acidosis HTN Arrest x2 P:  -Off pressors -  On clevidipine drip suspect she will come off antihypertensive drip when she is extubated and pain is controlled. - EKG monitoring - Cards is following the patient -CT of the abdomen following code shows hematoma adjacent to right femoral artery.  Moderate to large volume of ascites.  Probable trace pulmonary edema in the lungs  RENAL Lab Results  Component Value Date   CREATININE 4.54 (H) 04/19/2017   CREATININE 3.89 (H) 04/18/2017   CREATININE 3.68 (H) 04/18/2017    A:   ESRD Metabolic acidosis P:   - Renal consult on 04/16/2017 - CRRT has been completed on 04/17/2017 -Blood pressure is adequate she can undergo normal hemodialysis. -Intermittent hemodialysis being done by renal  GASTROINTESTINAL A:   Concern for ischemic bowel doubt with lactic acid 0.95 P:   - Abdominal CT is done 04/18/2018 - NPO -Tube feeding if remains on mechanical ventilatory support.  HEMATOLOGIC Recent Labs    04/18/17 0635 04/19/17 0356  HGB 9.3* 7.5*    A:   No active issues P:  - CBC - Transfuse per ICU protocol  INFECTIOUS A:   Concern for ischemic bowel or intraabdominal infection lactic acid was .95  p:   - Levaquin - Flagyl -11/21 blood cultures x2>>  ENDOCRINE CBG (last  3)  Recent Labs    04/18/17 2306 04/19/17 0405 04/19/17 0905  GLUCAP 87 146* 159*    A:   DM   P:   - ISS - CBG - No basal insulin in medication list, will ask diabetic coordinator to  see for basal insulin needs  NEUROLOGIC A:   Awake post code Awake and alert with no neurological deficits noted 04/17/2017 P:   RASS goal: 0 - Fentanyl as needed - Precedex drip - Avoid other sedating medications as able -Pain control as needed  FAMILY  - Updates: Family updated at bedside.  Patient awake and interactive despite being sedated and on mechanical ventilatory support.  - Inter-disciplinary family meet or Palliative Care meeting due by:  day 7  cct 30 min  Richardson Landry Minor ACNP Maryanna Shape PCCM Pager [phone] till 1 pm If no answer page [phone] 04/19/2017, 10:35 AM

## 2017-04-20 ENCOUNTER — Inpatient Hospital Stay (HOSPITAL_COMMUNITY): Payer: Medicare Other

## 2017-04-20 LAB — CBC WITH DIFFERENTIAL/PLATELET
BASOS ABS: 0 10*3/uL (ref 0.0–0.1)
BASOS PCT: 0 %
Eosinophils Absolute: 0.1 10*3/uL (ref 0.0–0.7)
Eosinophils Relative: 1 %
HEMATOCRIT: 22.3 % — AB (ref 36.0–46.0)
HEMOGLOBIN: 7 g/dL — AB (ref 12.0–15.0)
LYMPHS PCT: 13 %
Lymphs Abs: 1.2 10*3/uL (ref 0.7–4.0)
MCH: 28.7 pg (ref 26.0–34.0)
MCHC: 31.4 g/dL (ref 30.0–36.0)
MCV: 91.4 fL (ref 78.0–100.0)
Monocytes Absolute: 0.7 10*3/uL (ref 0.1–1.0)
Monocytes Relative: 8 %
NEUTROS ABS: 7.3 10*3/uL (ref 1.7–7.7)
NEUTROS PCT: 78 %
Platelets: 205 10*3/uL (ref 150–400)
RBC: 2.44 MIL/uL — AB (ref 3.87–5.11)
RDW: 17.8 % — AB (ref 11.5–15.5)
WBC: 9.3 10*3/uL (ref 4.0–10.5)

## 2017-04-20 LAB — RENAL FUNCTION PANEL
ALBUMIN: 2.3 g/dL — AB (ref 3.5–5.0)
ANION GAP: 16 — AB (ref 5–15)
ANION GAP: 15 (ref 5–15)
Albumin: 2.2 g/dL — ABNORMAL LOW (ref 3.5–5.0)
BUN: 20 mg/dL (ref 6–20)
BUN: 26 mg/dL — ABNORMAL HIGH (ref 6–20)
CHLORIDE: 98 mmol/L — AB (ref 101–111)
CHLORIDE: 98 mmol/L — AB (ref 101–111)
CO2: 24 mmol/L (ref 22–32)
CO2: 25 mmol/L (ref 22–32)
CREATININE: 3.83 mg/dL — AB (ref 0.44–1.00)
Calcium: 8.8 mg/dL — ABNORMAL LOW (ref 8.9–10.3)
Calcium: 8.8 mg/dL — ABNORMAL LOW (ref 8.9–10.3)
Creatinine, Ser: 3.3 mg/dL — ABNORMAL HIGH (ref 0.44–1.00)
GFR calc Af Amer: 19 mL/min — ABNORMAL LOW (ref >60)
GFR calc non Af Amer: 14 mL/min — ABNORMAL LOW (ref >60)
GFR, EST AFRICAN AMERICAN: 16 mL/min — AB (ref >60)
GFR, EST NON AFRICAN AMERICAN: 16 mL/min — AB (ref >60)
GLUCOSE: 115 mg/dL — AB (ref 65–99)
Glucose, Bld: 99 mg/dL (ref 65–99)
PHOSPHORUS: 3.1 mg/dL (ref 2.5–4.6)
POTASSIUM: 3.4 mmol/L — AB (ref 3.5–5.1)
Phosphorus: 4.3 mg/dL (ref 2.5–4.6)
Potassium: 3.8 mmol/L (ref 3.5–5.1)
Sodium: 138 mmol/L (ref 135–145)
Sodium: 138 mmol/L (ref 135–145)

## 2017-04-20 LAB — GLUCOSE, CAPILLARY
GLUCOSE-CAPILLARY: 96 mg/dL (ref 65–99)
GLUCOSE-CAPILLARY: 129 mg/dL — AB (ref 65–99)
GLUCOSE-CAPILLARY: 115 mg/dL — AB (ref 65–99)
Glucose-Capillary: 106 mg/dL — ABNORMAL HIGH (ref 65–99)
Glucose-Capillary: 104 mg/dL — ABNORMAL HIGH (ref 65–99)

## 2017-04-20 LAB — POCT I-STAT 7, (LYTES, BLD GAS, ICA,H+H)
Acid-base deficit: 7 mmol/L — ABNORMAL HIGH (ref 0.0–2.0)
Bicarbonate: 20.9 mmol/L (ref 20.0–28.0)
CALCIUM ION: 1.02 mmol/L — AB (ref 1.15–1.40)
HCT: 29 % — ABNORMAL LOW (ref 36.0–46.0)
Hemoglobin: 9.9 g/dL — ABNORMAL LOW (ref 12.0–15.0)
O2 Saturation: 71 %
POTASSIUM: 3.7 mmol/L (ref 3.5–5.1)
SODIUM: 139 mmol/L (ref 135–145)
TCO2: 22 mmol/L (ref 22–32)
pCO2 arterial: 52 mmHg — ABNORMAL HIGH (ref 32.0–48.0)
pH, Arterial: 7.211 — ABNORMAL LOW (ref 7.350–7.450)
pO2, Arterial: 46 mmHg — ABNORMAL LOW (ref 83.0–108.0)

## 2017-04-20 LAB — TRIGLYCERIDES: Triglycerides: 299 mg/dL — ABNORMAL HIGH (ref <150)

## 2017-04-20 LAB — BASIC METABOLIC PANEL
ANION GAP: 17 — AB (ref 5–15)
BUN: 21 mg/dL — ABNORMAL HIGH (ref 6–20)
CHLORIDE: 97 mmol/L — AB (ref 101–111)
CO2: 23 mmol/L (ref 22–32)
CREATININE: 3.2 mg/dL — AB (ref 0.44–1.00)
Calcium: 8.8 mg/dL — ABNORMAL LOW (ref 8.9–10.3)
GFR calc non Af Amer: 17 mL/min — ABNORMAL LOW (ref >60)
GFR, EST AFRICAN AMERICAN: 20 mL/min — AB (ref >60)
Glucose, Bld: 99 mg/dL (ref 65–99)
POTASSIUM: 3.4 mmol/L — AB (ref 3.5–5.1)
SODIUM: 137 mmol/L (ref 135–145)

## 2017-04-20 LAB — PHOSPHORUS: PHOSPHORUS: 3.1 mg/dL (ref 2.5–4.6)

## 2017-04-20 LAB — MAGNESIUM
MAGNESIUM: 2 mg/dL (ref 1.7–2.4)
Magnesium: 2 mg/dL (ref 1.7–2.4)

## 2017-04-20 LAB — PROCALCITONIN: Procalcitonin: 26.08 ng/mL

## 2017-04-20 MED ORDER — CLONIDINE HCL 0.2 MG PO TABS
0.3000 mg | ORAL_TABLET | Freq: Three times a day (TID) | ORAL | Status: DC
  Administered 2017-04-20 – 2017-04-28 (×20): 0.3 mg via NASOGASTRIC
  Filled 2017-04-20 (×23): qty 1

## 2017-04-20 MED ORDER — SPIRONOLACTONE 25 MG PO TABS
100.0000 mg | ORAL_TABLET | Freq: Every day | ORAL | Status: DC
  Administered 2017-04-20 – 2017-04-27 (×8): 100 mg via NASOGASTRIC
  Filled 2017-04-20 (×9): qty 4

## 2017-04-20 MED ORDER — NEPRO/CARBSTEADY PO LIQD
1000.0000 mL | ORAL | Status: DC
  Administered 2017-04-20: 1000 mL
  Filled 2017-04-20 (×3): qty 1000

## 2017-04-20 MED ORDER — PRO-STAT SUGAR FREE PO LIQD: 30.0000 mL | Freq: Every day | ORAL | Status: DC

## 2017-04-20 MED ORDER — PRO-STAT SUGAR FREE PO LIQD
60.0000 mL | Freq: Two times a day (BID) | ORAL | Status: DC
  Administered 2017-04-20 – 2017-04-23 (×5): 60 mL
  Filled 2017-04-20 (×6): qty 60

## 2017-04-20 MED ORDER — ONDANSETRON HCL 4 MG/2ML IJ SOLN
INTRAMUSCULAR | Status: AC
  Administered 2017-04-20: 4 mg via INTRAVENOUS
  Filled 2017-04-20: qty 2

## 2017-04-20 MED ORDER — VANCOMYCIN HCL 10 G IV SOLR
2000.0000 mg | Freq: Once | INTRAVENOUS | Status: AC
  Administered 2017-04-20: 2000 mg via INTRAVENOUS
  Filled 2017-04-20: qty 2000

## 2017-04-20 MED ORDER — PANTOPRAZOLE SODIUM 40 MG PO PACK
40.0000 mg | PACK | Freq: Every day | ORAL | Status: DC
  Administered 2017-04-21 – 2017-04-28 (×8): 40 mg
  Filled 2017-04-20 (×9): qty 20

## 2017-04-20 MED ORDER — ONDANSETRON HCL 4 MG/2ML IJ SOLN
4.0000 mg | Freq: Four times a day (QID) | INTRAMUSCULAR | Status: DC | PRN
  Administered 2017-04-20: 4 mg via INTRAVENOUS

## 2017-04-20 NOTE — Progress Notes (Signed)
04/20/2017 2:49 PM Shortly after initiating TF this am, pt. Very agitated, holding stomach. Nods her head yes to if she was having pain and nausea. TF stopped at this time. KUB already obtained this AM. Richardson Landry Minor NP on floor and made aware of findings. Verbal order to hold on TF for now, reconnect to intermittent suction. Orders enacted. Orders also obtained for Zofran Prn. Orders enacted. Will continue to closely monitor patient.  Brenn Deziel, Arville Lime

## 2017-04-20 NOTE — Progress Notes (Signed)
Subjective:  HD late last night - removed  3750- seemed to tolerate well - BP still up - on cleviprex Objective Vital signs in last 24 hours: Vitals:   04/20/17 0330 04/20/17 0400 04/20/17 0500 04/20/17 0550  BP:  (!) 184/68 (!) 191/70   Pulse:  96 95   Resp:  (!) 27 (!) 21   Temp:    (!) 103 F (39.4 C)  TempSrc:    Axillary  SpO2: 100% 100% 100%   Weight:      Height:       Weight change: 0 kg (0 lb)  Intake/Output Summary (Last 24 hours) at 04/20/2017 0631 Last data filed at 04/20/2017 0500 Gross per 24 hour  Intake 1101.47 ml  Output 5930 ml  Net -4828.53 ml    Dialyzes at [place] (not getting there). MWF, 4 hours and 15 min HD Bath 2K/2.5 calc , Dialyzer opti 180, Heparin yes, 2000. Access left lower arm AVF. Profile #2-  hect 7 mircera 225 - got on 11/14 Last hgb 9.3, calc 8.7 , phos 9.0, pth 228  Also on sensipar 60 and ayryxia  Noted to be hypertensive as OP with 5 listed BP meds ?       Assessment/Plan: 41 year old WF with multiple medical issues "vasculopath" admitted for elective management of PAD - developed severe hypotension and metabolic acidosis in the OR- req CPR, pressors  1 PAD- almost completed invasive management of PAD per vascular- still in need of transmet amp 2. Acute decompensation- now times 2- in OR and on 30/86- acute metabolic acidosis and hemodynamic collapse.  So far responded to medical therapy in ICU-   No exact etiology found for events. On vanc and flagyl - now levaquin and flagyl.  Temp overnight- WBC down - bld cx neg /CXR ordered - could remove femoral HD cath  3 ESRD: normally MWF via AVF- got OP treatment on 11/20.  Req CRRT for instability here- was on it from Wednesday after noon to 10:30 PM Thursday. Is stable for IHD now- done last night- plan for next tomorrow on schedule 3 Hypertension: usually very hypertensive- req pressors transiently -now on mostly PRN IV labet and hydral but norvasc and clonidine ordered per tube  until  able to take PO meds- will add aldactone per tube  4. Anemia of ESRD: hgb 8's>>7.'s- sounds like is on max ESA and most recent OP hgb was 9- cont supportive care and transfuse as needed - prob will drop with procedures - may need blood with HD tomorrow 5. Metabolic Bone Disease: will resume sensipar and binders when eating- hold vit D for now as well - ca/phos are OK       Bebe Moncure A    Labs: Basic Metabolic Panel: Recent Labs  Lab 04/19/17 0356 04/19/17 1559 04/20/17 0432  NA 138 139 137  138  K 4.2 5.1 3.4*  3.4*  CL 101 100* 97*  98*  CO2 22 24 23  24   GLUCOSE 144* 125* 99  99  BUN 39* 44* 21*  20  CREATININE 4.54* 5.08* 3.20*  3.30*  CALCIUM 9.2 9.3 8.8*  8.8*  PHOS 4.6 7.1* 3.1  3.1   Liver Function Tests: Recent Labs  Lab 04/15/17 1356  04/18/17 0635  04/19/17 0356 04/19/17 1559 04/20/17 0432  AST 20  --  290*  --   --   --   --   ALT 15  --  140*  --   --   --   --  ALKPHOS 449*  --  281*  --   --   --   --   BILITOT 0.7  --  1.5*  --   --   --   --   PROT 7.0  --  5.8*  --   --   --   --   ALBUMIN 2.8*   < > 2.2*   < > 2.2* 2.3* 2.3*   < > = values in this interval not displayed.   No results for input(s): LIPASE, AMYLASE in the last 168 hours. No results for input(s): AMMONIA in the last 168 hours. CBC: Recent Labs  Lab 04/17/17 0451 04/18/17 0445 04/18/17 0635 04/19/17 0356 04/19/17 1730 04/20/17 0432  WBC 8.0 11.9* 16.9* 9.0  --  9.3  NEUTROABS  --  9.1*  --  7.3  --  7.3  HGB 8.3* 8.4* 9.3* 7.5* 7.3* 7.0*  HCT 26.7* 27.9* 32.0* 23.4* 23.0* 22.3*  MCV 91.4 94.9 98.5 90.3  --  91.4  PLT 197 238 239 199  --  205   Cardiac Enzymes: Recent Labs  Lab 04/18/17 0635  TROPONINI 0.67*   CBG: Recent Labs  Lab 04/19/17 1237 04/19/17 1527 04/19/17 2001 04/19/17 2331 04/20/17 0353  GLUCAP 143* 123* 93 94 96    Iron Studies: No results for input(s): IRON, TIBC, TRANSFERRIN, FERRITIN in the last 72  hours. Studies/Results: Ct Angio Chest Pe W Or Wo Contrast  Result Date: 04/18/2017 CLINICAL DATA:  41 year old female with increasing shortness of breath and increasing lactic acid level. Status post femoral endarterectomy on the right side followed by sudden cardiac arrest. Evaluate for abdominal vascular injury versus mesenteric ischemia. EXAM: CT ANGIOGRAPHY CHEST, ABDOMEN AND PELVIS TECHNIQUE: Multidetector CT imaging of the chest was performed using the standard protocol during bolus administration of intravenous contrast. Multiplanar CT image reconstructions and MIPs were obtained to evaluate the vascular anatomy. Multidetector CT imaging of the abdomen and pelvis was performed using the standard protocol during bolus administration of intravenous contrast. CONTRAST:  171mL ISOVUE-370 IOPAMIDOL (ISOVUE-370) INJECTION 76% COMPARISON:  CT the abdomen and pelvis 02/15/2016. Chest CT 10/31/2014. FINDINGS: Comment: Although study protocol was for a PE protocol chest CTA and CTA abdomen and pelvis, the CTA of the abdomen and pelvis is suboptimal secondary to poor contrast opacification of the abdominal arteries, and is accordingly considered nondiagnostic in terms of CTA, but considered to be a diagnostic contrast enhanced CT of the abdomen and pelvis. CTA CHEST FINDINGS Cardiovascular: There are no filling defects within the pulmonary arterial tree to suggest underlying pulmonary embolism. Heart size is mildly enlarged with probable concentric left ventricular hypertrophy. There is no significant pericardial fluid, thickening or pericardial calcification. There is aortic atherosclerosis, as well as atherosclerosis of the great vessels of the mediastinum and the coronary arteries, including calcified atherosclerotic plaque in the left main, left anterior descending, left circumflex and right coronary arteries. Dilatation of the pulmonic trunk (3.7 cm in diameter), concerning for pulmonary arterial  hypertension. Mediastinum/Nodes: No pathologically enlarged mediastinal or hilar lymph nodes. Esophagus is unremarkable in appearance. No axillary lymphadenopathy. Endotracheal tube in position with tip terminating 5.2 cm above the carina. Lungs/Pleura: Patchy multifocal mild ground-glass attenuation scattered throughout the lungs bilaterally, with some mild interlobular septal thickening, suggesting a background of very mild interstitial pulmonary edema. Some patchy peribronchovascular ground-glass attenuation is also noted dependently in the left upper lobe and right lower lobe, which could reflect sequela of mild aspiration. Dependent areas of subsegmental atelectasis are  also noted in the lower lobes of the lungs bilaterally. Musculoskeletal: Multiple bilateral rib fractures are noted including the anterior aspects of the right second rib, anterior third ribs bilaterally, in the anterior right fifth rib. There are no aggressive appearing lytic or blastic lesions noted in the visualized portions of the skeleton. Review of the MIP images confirms the above findings. CTA ABDOMEN AND PELVIS FINDINGS Hepatobiliary: No cystic or solid hepatic lesions. No intra or extrahepatic biliary ductal dilatation. Amorphous high attenuation material lying dependently in the gallbladder may reflect vicarious excretion of contrast material related to recent vascular intervention, or could reflect biliary sludge. No findings to suggest an acute cholecystitis at this time. Pancreas: No pancreatic mass. No pancreatic ductal dilatation. No definite peripancreatic inflammatory changes. Spleen: Unremarkable. Adrenals/Urinary Tract: Severe atrophy of the kidneys bilaterally which are remarkable for extensive vascular calcifications. 2.2 cm simple cyst in the lower pole of the left kidney. Bilateral adrenal glands are normal in appearance. No hydroureteronephrosis. Urinary bladder is nearly completely decompressed, but otherwise  unremarkable in appearance. Stomach/Bowel: Normal appearance of the stomach. No pathologic dilatation of small bowel or colon. No focal areas of mural thickening. No definite regions of lack of mucosal enhancement confidently identified on today's examination. Normal appendix. Vascular/Lymphatic: Aortic atherosclerosis. Although poorly evaluated related to suboptimal contrast bolus, the abdominal aorta, proximal celiac axis, and proximal superior mesenteric artery appear grossly patent. Inferior mesenteric artery is too small in caliber to accurately assess on today's suboptimal examination. No definite aneurysm or dissection noted in the abdominal or pelvic vasculature. 3.8 x 2.9 cm high attenuation collection adjacent to the right superficial femoral artery (axial image 108 of series 12), potentially a hematoma related to recent vascular access. Left femoral central venous catheter with tip terminating in the proximal left external iliac vein. No lymphadenopathy noted in the abdomen or pelvis. Reproductive: Uterus and ovaries are unremarkable in appearance. Other: Moderate to large volume of ascites.  No pneumoperitoneum. Musculoskeletal: There are no aggressive appearing lytic or blastic lesions noted in the visualized portions of the skeleton. Review of the MIP images confirms the above findings. IMPRESSION: 1. No evidence of pulmonary embolism. 2. Nondiagnostic CTA of the abdomen and pelvis. Despite this limitation, the proximal aspects of the celiac axis and superior mesenteric artery both appear patent. No definite findings in the bowel to strongly suggest ischemia at this time. 3. Probable hematoma adjacent to the right superficial femoral artery measuring 3.8 x 2.9 cm. No hypervascular blush to clearly indicate pseudoaneurysm on today's examination. 4. Moderate to large volume of ascites. 5. Probable trace interstitial pulmonary edema in the lungs. 6. Cardiomegaly with concentric left ventricular  hypertrophy. 7. Aortic atherosclerosis, in addition to left main and 3 vessel coronary artery disease. Please note that although the presence of coronary artery calcium documents the presence of coronary artery disease, the severity of this disease and any potential stenosis cannot be assessed on this non-gated CT examination. Assessment for potential risk factor modification, dietary therapy or pharmacologic therapy may be warranted, if clinically indicated. 8. Additional incidental findings, as above. Electronically Signed   By: Vinnie Langton M.D.   On: 04/18/2017 16:15   Dg Chest Port 1 View  Result Date: 04/18/2017 CLINICAL DATA:  41 year old female status post cardiac arrest. 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 EXAM: PORTABLE CHEST 1 VIEW COMPARISON:  04/17/2017 and earlier. FINDINGS: Portable AP supine view at 0643 hours. Endotracheal  tube tip in good position at the level the clavicles. Enteric tube removed. Other curvilinear objects projecting over the mediastinum and central diaphragm are presumed to be external. Pacer/resuscitation pads are in place. Stable cardiomegaly and mediastinal contours. Continued increased bilateral interstitial opacity, mildly progressed since yesterday. Stable more confluent infrahilar opacity. No pleural effusion or pneumothorax. No consolidation. IMPRESSION: 1. Increased interstitial edema since yesterday. Stable cardiomegaly. No consolidation or pleural effusion is evident. 2. Enteric tube removed.  ET tube in good position. Electronically Signed   By: Genevie Ann M.D.   On: 04/18/2017 07:07   Dg Abd Portable 1v  Result Date: 04/19/2017 CLINICAL DATA:  41 year old female with a history of OG placement EXAM: PORTABLE ABDOMEN - 1 VIEW COMPARISON:  CT 04/18/2017 FINDINGS: Limited plain film for evaluation of OG placement. Gastric tube terminates within the stomach. Partial  visualization of defibrillator pads. Gas within stomach and small bowel without distention. Vascular calcifications. IMPRESSION: Gastric tube terminates within the stomach. Electronically Signed   By: Corrie Mckusick D.O.   On: 04/19/2017 11:47   Ct Angio Abd/pel W/ And/or W/o  Result Date: 04/18/2017 CLINICAL DATA:  41 year old female with increasing shortness of breath and increasing lactic acid level. Status post femoral endarterectomy on the right side followed by sudden cardiac arrest. Evaluate for abdominal vascular injury versus mesenteric ischemia. EXAM: CT ANGIOGRAPHY CHEST, ABDOMEN AND PELVIS TECHNIQUE: Multidetector CT imaging of the chest was performed using the standard protocol during bolus administration of intravenous contrast. Multiplanar CT image reconstructions and MIPs were obtained to evaluate the vascular anatomy. Multidetector CT imaging of the abdomen and pelvis was performed using the standard protocol during bolus administration of intravenous contrast. CONTRAST:  122mL ISOVUE-370 IOPAMIDOL (ISOVUE-370) INJECTION 76% COMPARISON:  CT the abdomen and pelvis 02/15/2016. Chest CT 10/31/2014. FINDINGS: Comment: Although study protocol was for a PE protocol chest CTA and CTA abdomen and pelvis, the CTA of the abdomen and pelvis is suboptimal secondary to poor contrast opacification of the abdominal arteries, and is accordingly considered nondiagnostic in terms of CTA, but considered to be a diagnostic contrast enhanced CT of the abdomen and pelvis. CTA CHEST FINDINGS Cardiovascular: There are no filling defects within the pulmonary arterial tree to suggest underlying pulmonary embolism. Heart size is mildly enlarged with probable concentric left ventricular hypertrophy. There is no significant pericardial fluid, thickening or pericardial calcification. There is aortic atherosclerosis, as well as atherosclerosis of the great vessels of the mediastinum and the coronary arteries, including  calcified atherosclerotic plaque in the left main, left anterior descending, left circumflex and right coronary arteries. Dilatation of the pulmonic trunk (3.7 cm in diameter), concerning for pulmonary arterial hypertension. Mediastinum/Nodes: No pathologically enlarged mediastinal or hilar lymph nodes. Esophagus is unremarkable in appearance. No axillary lymphadenopathy. Endotracheal tube in position with tip terminating 5.2 cm above the carina. Lungs/Pleura: Patchy multifocal mild ground-glass attenuation scattered throughout the lungs bilaterally, with some mild interlobular septal thickening, suggesting a background of very mild interstitial pulmonary edema. Some patchy peribronchovascular ground-glass attenuation is also noted dependently in the left upper lobe and right lower lobe, which could reflect sequela of mild aspiration. Dependent areas of subsegmental atelectasis are also noted in the lower lobes of the lungs bilaterally. Musculoskeletal: Multiple bilateral rib fractures are noted including the anterior aspects of the right second rib, anterior third ribs bilaterally, in the anterior right fifth rib. There are no aggressive appearing lytic or blastic lesions noted in the visualized portions of the skeleton. Review of the MIP  images confirms the above findings. CTA ABDOMEN AND PELVIS FINDINGS Hepatobiliary: No cystic or solid hepatic lesions. No intra or extrahepatic biliary ductal dilatation. Amorphous high attenuation material lying dependently in the gallbladder may reflect vicarious excretion of contrast material related to recent vascular intervention, or could reflect biliary sludge. No findings to suggest an acute cholecystitis at this time. Pancreas: No pancreatic mass. No pancreatic ductal dilatation. No definite peripancreatic inflammatory changes. Spleen: Unremarkable. Adrenals/Urinary Tract: Severe atrophy of the kidneys bilaterally which are remarkable for extensive vascular  calcifications. 2.2 cm simple cyst in the lower pole of the left kidney. Bilateral adrenal glands are normal in appearance. No hydroureteronephrosis. Urinary bladder is nearly completely decompressed, but otherwise unremarkable in appearance. Stomach/Bowel: Normal appearance of the stomach. No pathologic dilatation of small bowel or colon. No focal areas of mural thickening. No definite regions of lack of mucosal enhancement confidently identified on today's examination. Normal appendix. Vascular/Lymphatic: Aortic atherosclerosis. Although poorly evaluated related to suboptimal contrast bolus, the abdominal aorta, proximal celiac axis, and proximal superior mesenteric artery appear grossly patent. Inferior mesenteric artery is too small in caliber to accurately assess on today's suboptimal examination. No definite aneurysm or dissection noted in the abdominal or pelvic vasculature. 3.8 x 2.9 cm high attenuation collection adjacent to the right superficial femoral artery (axial image 108 of series 12), potentially a hematoma related to recent vascular access. Left femoral central venous catheter with tip terminating in the proximal left external iliac vein. No lymphadenopathy noted in the abdomen or pelvis. Reproductive: Uterus and ovaries are unremarkable in appearance. Other: Moderate to large volume of ascites.  No pneumoperitoneum. Musculoskeletal: There are no aggressive appearing lytic or blastic lesions noted in the visualized portions of the skeleton. Review of the MIP images confirms the above findings. IMPRESSION: 1. No evidence of pulmonary embolism. 2. Nondiagnostic CTA of the abdomen and pelvis. Despite this limitation, the proximal aspects of the celiac axis and superior mesenteric artery both appear patent. No definite findings in the bowel to strongly suggest ischemia at this time. 3. Probable hematoma adjacent to the right superficial femoral artery measuring 3.8 x 2.9 cm. No hypervascular blush to  clearly indicate pseudoaneurysm on today's examination. 4. Moderate to large volume of ascites. 5. Probable trace interstitial pulmonary edema in the lungs. 6. Cardiomegaly with concentric left ventricular hypertrophy. 7. Aortic atherosclerosis, in addition to left main and 3 vessel coronary artery disease. Please note that although the presence of coronary artery calcium documents the presence of coronary artery disease, the severity of this disease and any potential stenosis cannot be assessed on this non-gated CT examination. Assessment for potential risk factor modification, dietary therapy or pharmacologic therapy may be warranted, if clinically indicated. 8. Additional incidental findings, as above. Electronically Signed   By: Vinnie Langton M.D.   On: 04/18/2017 16:15   Medications: Infusions: . sodium chloride    . sodium chloride    . clevidipine 15 mg/hr (04/20/17 0553)  . fentaNYL infusion INTRAVENOUS 200 mcg/hr (04/19/17 2342)  . levofloxacin (LEVAQUIN) IV Stopped (04/18/17 1902)  . metronidazole Stopped (04/20/17 0252)  . dialysis replacement fluid (prismasate) 500 mL/hr at 04/17/17 1407  . dialysis replacement fluid (prismasate) 500 mL/hr at 04/17/17 1411  . dialysate (PRISMASATE) 1,500 mL/hr at 04/17/17 1746  . sodium chloride      Scheduled Medications: . amLODipine  10 mg Oral Daily  . Chlorhexidine Gluconate Cloth  6 each Topical Daily  . cloNIDine  0.3 mg Oral TID  . docusate  100 mg Per Tube BID  . heparin  5,000 Units Subcutaneous Q8H  . insulin aspart  2-6 Units Subcutaneous Q4H  . pantoprazole (PROTONIX) IV  40 mg Intravenous Q24H  . sennosides  5 mL Per Tube BID  . sodium chloride flush  10-40 mL Intracatheter Q12H    have reviewed scheduled and prn medications.  Physical Exam: General: resting, family at bedside, wants tube out  Heart: RRR Lungs: mostly clear Abdomen: distended Extremities: min edema- ischemic changes Dialysis Access: left groin trialysis  cath and left AVF patent     04/20/2017,6:31 AM  LOS: 4 days

## 2017-04-20 NOTE — Progress Notes (Signed)
04/20/2017 1400 Dr. Ashok Cordia called and made aware of pt. With continued elevated temperature. Orders received for cooling blanket. Ordered from SPD. Will continue to closely monitor patient.  Sabrina Arriaga, Arville Lime

## 2017-04-20 NOTE — Progress Notes (Signed)
VASCULAR SURGERY ASSESSMENT & PLAN:   4 Days Post-Op s/p: Right common femoral artery endarterectomy.  She has a fairly brisk dorsalis pedis signal on the right with the Doppler.  ID: T-max at 5 AM was 103.  Her white blood cell count is 9.3.  Blood cultures have been sent.  Previous blood cultures done on 04/16/2017 are negative so far.  Chest x-ray does not show any evidence of pneumonia.  I do not think that the wounds on her right foot are the source for the fever although they are certainly in the differential diagnosis.  She does have a left femoral tunneled dialysis catheter and this could potentially be a source although it is fairly early to have a catheter infection as this was just placed 4 days ago.  She also reportedly has a wound on her back and I will take a look at this later this morning hopefully after she is been extubated.  I will also check a urine culture.  ACUTE BLOOD LOSS ANEMIA: Her hemoglobin is 7.0.  I suspect she will need transfusion today but I will leave that to critical care medicine.  Significant lymphatic drainage from the right groin.  Continue JP.   SUBJECTIVE:   SEDATED on vent.  PHYSICAL EXAM:   Vitals:   04/20/17 0330 04/20/17 0400 04/20/17 0500 04/20/17 0550  BP:  (!) 184/68 (!) 191/70   Pulse:  96 95   Resp:  (!) 27 (!) 21   Temp:    (!) 103 F (39.4 C)  TempSrc:    Axillary  SpO2: 100% 100% 100%   Weight:      Height:       The JP drain in the right groin 480 cc of lymphatic fluid last shift.  Lungs are clear bilaterally to auscultation. On the right side she has a brisk dorsalis pedis signal with Doppler. On the left side she has a posterior tibial and dorsalis pedis signal with the Doppler.  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  There is not any significant drainage.  There is mild erythema.   LABS:   Lab Results  Component Value Date   WBC 9.3 04/20/2017   HGB 7.0 (L) 04/20/2017   HCT 22.3 (L) 04/20/2017   MCV 91.4  04/20/2017   PLT 205 04/20/2017   Lab Results  Component Value Date   CREATININE 3.30 (H) 04/20/2017   CREATININE 3.20 (H) 04/20/2017   Lab Results  Component Value Date   INR 1.31 04/18/2017   CHEST X-RAY: Her chest x-ray has not been read yet, however I do not see any evidence of pneumonia or significant atelectasis.   CBG (last 3)  Recent Labs    04/19/17 2001 04/19/17 2331 04/20/17 0353  GLUCAP 93 94 96    PROBLEM LIST:    Active Problems:   PVD (peripheral vascular disease) (HCC)   Cardiac arrest, cause unspecified (HCC)   CURRENT MEDS:   . amLODipine  10 mg Oral Daily  . Chlorhexidine Gluconate Cloth  6 each Topical Daily  . cloNIDine  0.3 mg Oral TID  . docusate  100 mg Per Tube BID  . heparin  5,000 Units Subcutaneous Q8H  . insulin aspart  2-6 Units Subcutaneous Q4H  . pantoprazole (PROTONIX) IV  40 mg Intravenous Q24H  . sennosides  5 mL Per Tube BID  . sodium chloride flush  10-40 mL Intracatheter [place]: [phone] Office: [phone] 04/20/2017

## 2017-04-20 NOTE — Progress Notes (Addendum)
04/20/2017 10:47 AM Noted order to d/c femoral HD catheter. No other IV access at this time. Pt. On multiple iv antibiotics, IV therapy notified to attempt PIV. Will continue to closely monitor patient.  Emry Tobin, Arville Lime 6:19 PM Verbal order Dr. Ashok Cordia leave HD catheter in place for now. Will reassess in am. Orders enacted.  Jalen Daluz, Arville Lime

## 2017-04-20 NOTE — Progress Notes (Addendum)
Pharmacy Antibiotic Note  [name] is a 41 y.o. female admitted on 04/16/2017 with intra-abdominal infection.  Pharmacy has been consulted for levofloxacin dosing, also on metronidazole per MD. Pt recently on CRRT now transitioned back to iHD. Cultures have been negative but pt spiked fevers overnight, planning to reculture.  Plan: -Levofloxacin 500mg  IV q48h -Metronidazole 500mg  IV q8h -Monitor repeat cultures  ADDENDUM: Pharmacy consulted to add vancomycin given breakthrough fevers. Will give 2000mg  IV load, then follow-up definitive HD planning for maintenance dosing.   Height: 6\' 4"  (193 cm) Weight: 203 lb 7.8 oz (92.3 kg) IBW/kg (Calculated) : 82.3  Temp (24hrs), Avg:100.7 F (38.2 C), Min:98.3 F (36.8 C), Max:103 F (39.4 C)  Recent Labs  Lab 04/17/17 0451  04/18/17 0445 04/18/17 0606 04/18/17 0635 04/18/17 1000 04/18/17 1610 04/19/17 0356 04/19/17 1559 04/20/17 0432  WBC 8.0  --  11.9*  --  16.9*  --   --  9.0  --  9.3  CREATININE 3.47*  3.41*   < > 3.57*  --  3.54* 3.68* 3.89* 4.54* 5.08* 3.20*  3.30*  LATICACIDVEN  --   --   --  9.2*  --   --  1.0  --   --   --    < > = values in this interval not displayed.    Estimated Creatinine Clearance: 29.1 mL/min (A) (by C-G formula based on SCr of 3.3 mg/dL (H)).    Allergies  Allergen Reactions  . Cephalexin Other (See Comments)    Reaction unknown- Childhood allergy Tolerated Ceftriaxone in the past  . Sulfamethoxazole-Trimethoprim Other (See Comments)    Unknown reaction. Pt states that she was told by her mother that she had allergy to Bactrim as a child.    Antimicrobials this admission: 11/21 Levofloxacin >>   Dose adjustments this admission: n/a  Microbiology results: 11/21 BCx: px  10/29 MRSA PCR: negative  Thank you for allowing pharmacy to be a part of this patient's care.  Arrie Senate, PharmD, BCPS PGY-2 Cardiology Pharmacy Resident Pager: [phone] 04/20/2017

## 2017-04-20 NOTE — Progress Notes (Signed)
04/20/2017 0830 Verbal order Richardson Landry Minor NP ok to perform I & O cath to obtain urine culture. I & O cath performed, removing 35 ml clear yellow urine from bladder, culture sent. Will continue to closely monitor patient.  [name], Arville Lime

## 2017-04-20 NOTE — Progress Notes (Signed)
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  The patient was profoundly acidotic and once that was addressed patient had a stable rhythm and was brought to the ICU for CRRT to address profound metabolic acidosis.  Patient was unresponsive upon arrival but with stabilization of BP reportedly became responsive.  PCCM was consulted for vent and vital signs management. Cardiac arrest x2 and 48 hours suspected from profound acidotic state.   SUBJECTIVE:  Complaints of tenderness to chest with inspiration and to touch.  VITAL SIGNS: BP (!) 168/70   Pulse 98   Temp (!) 103 F (39.4 C) (Axillary)   Resp 18   Ht 6\' 4"  (1.93 m)   Wt 203 lb 7.8 oz (92.3 kg)   LMP 10/09/2015 Comment: pt on dialysis  SpO2 94%   BMI 24.77 kg/m   HEMODYNAMICS:    VENTILATOR SETTINGS: Vent Mode: CPAP;PSV FiO2 (%):  [40 %-50 %] 40 % Set Rate:  [24 bmp] 24 bmp Vt Set:  [660 mL] 660 mL PEEP:  [8 cmH20] 8 cmH20 Pressure Support:  [8 cmH20] 8 cmH20 Plateau Pressure:  [15 cmH20-25 cmH20] 24 cmH20  INTAKE / OUTPUT: I/O last 3 completed shifts: In: 1651.8 [I.V.:1351.8; IV Piggyback:300] Out: 4098 [Emesis/NG output:1450; Drains:840; Other:3750]  PHYSICAL EXAMINATION: General: Arouses to voice indicates chest wall pain HEENT: Endotracheal tube to vent PSY: Sedated Neuro: Intact CV: Heart sounds are regular rate and rhythm PULM: Decreased breath sounds in the base.  Planes of chest wall pain GI: Slightly distended, tender to touch, Jackson-Pratt drainage with moderate bloody drainage Extremities:  Bilateral lower extremities with ischemic changes, right foot worse than left Skin: no rashes or lesions     LABS:  BMET Recent Labs  Lab 04/19/17 0356 04/19/17 1559 04/20/17 0432  NA 138 139 137  138  K 4.2 5.1 3.4*  3.4*  CL 101 100* 97*  98*  CO2 22 24 23  24   BUN 39* 44* 21*  20  CREATININE 4.54* 5.08* 3.20*  3.30*  GLUCOSE 144* 125* 99  99    Electrolytes Recent Labs  Lab 04/19/17 0356 04/19/17 1559 04/20/17 0432  CALCIUM 9.2 9.3 8.8*  8.8*  MG 2.3 2.6* 2.0  PHOS 4.6 7.1* 3.1  3.1    CBC Recent Labs  Lab 04/18/17 0635 04/19/17 0356 04/19/17 1730 04/20/17 0432  WBC 16.9* 9.0  --  9.3  HGB 9.3* 7.5* 7.3* 7.0*  HCT 32.0* 23.4* 23.0* 22.3*  PLT 239 199  --  205    Coag's Recent Labs  Lab 04/15/17 1356 04/18/17 0635  APTT 39*  --   INR 1.14 1.31    Sepsis Markers Recent Labs  Lab 04/18/17 0606 04/18/17 1610  LATICACIDVEN 9.2* 1.0    ABG Recent Labs  Lab 04/17/17 1053 04/18/17 0624 04/18/17 0925  PHART 7.356 7.207* 7.486*  PCO2ART 48.1* 50.1* 33.6  PO2ART 65.0* 37.0* 268*    Liver Enzymes Recent Labs  Lab 04/15/17 1356  04/18/17 0635  04/19/17 0356 04/19/17 1559 04/20/17 0432  AST 20  --  290*  --   --   --   --  ALT 15  --  140*  --   --   --   --   ALKPHOS 449*  --  281*  --   --   --   --   BILITOT 0.7  --  1.5*  --   --   --   --   ALBUMIN 2.8*   < > 2.2*   < > 2.2* 2.3* 2.3*   < > = values in this interval not displayed.    Cardiac Enzymes Recent Labs  Lab 04/18/17 0635  TROPONINI 0.67*    Glucose Recent Labs  Lab 04/19/17 0905 04/19/17 1237 04/19/17 1527 04/19/17 2001 04/19/17 2331 04/20/17 0353  GLUCAP 159* 143* 123* 93 94 96    Imaging Dg Chest Port 1 View  Result Date: 04/20/2017 CLINICAL DATA:  41 year old female with history of respiratory failure. EXAM: PORTABLE CHEST 1 VIEW COMPARISON:  Chest x-ray 04/18/2017. FINDINGS: An endotracheal tube is in place with tip 7.2 cm above the  carina. Nasogastric tube in position with tip in the distal third of the esophagus. Lung volumes are low. There is cephalization of the pulmonary vasculature and slight indistinctness of the interstitial markings suggestive of mild pulmonary edema. No definite pleural effusions. No pneumothorax. Mild cardiomegaly. Upper mediastinal contours are within normal limits. Transcutaneous defibrillator pads project over the lower left hemithorax. IMPRESSION: 1. Support apparatus, as above. 2. The appearance of the chest suggests mild congestive heart failure, as above. Electronically Signed   By: Vinnie Langton M.D.   On: 04/20/2017 07:38   Dg Abd Portable 1v  Result Date: 04/20/2017 CLINICAL DATA:  41 year old female with history of gastric tube placement EXAM: PORTABLE ABDOMEN - 1 VIEW COMPARISON:  04/19/2017 FINDINGS: Gastric tube terminates within the stomach. Defibrillator pads on the lower chest. Overlying EKG leads. Vascular calcifications. Unremarkable appearance of the visualized upper abdomen and lower chest. IMPRESSION: Gastric tube terminates within the stomach. Electronically Signed   By: Corrie Mckusick D.O.   On: 04/20/2017 09:16   Dg Abd Portable 1v  Result Date: 04/19/2017 CLINICAL DATA:  41 year old female with a history of OG placement EXAM: PORTABLE ABDOMEN - 1 VIEW COMPARISON:  CT 04/18/2017 FINDINGS: Limited plain film for evaluation of OG placement. Gastric tube terminates within the stomach. Partial visualization of defibrillator pads. Gas within stomach and small bowel without distention. Vascular calcifications. IMPRESSION: Gastric tube terminates within the stomach. Electronically Signed   By: Corrie Mckusick D.O.   On: 04/19/2017 11:47     STUDIES:  Abdominal/chest CT 11/21>>> no evidence of pulmonary embolism.  Nondiagnostic CT of the abdomen and pelvis.  Probable hematoma in the right superficial femoral Artery.  Moderate to large volume this ascites Head CT 11/21>>> not  done  CULTURES: Blood 11/21>>> Blood culture 04/19/2017>> Urine culture 04/20/2017 done via in and out cath with 30 cc obtained>> Sputum culture 04/20/2017>>  ANTIBIOTICS: Levaquin 11/21>>> Flagyl 11/21>>> Vancomycin 04/20/2017 secondary to fever spike despite current and microbial therapy treatment>>   SIGNIFICANT EVENTS:  11/21>>> Cardiac arrest in OR 04/17/2017 extubated 04/18/2017>> second cardiac arrest requiring intubation and 30 minutes of CPR 04/20/2017 in and out cath with 30 cc obtained 04/20/2017 no extubation at this time secondary to chest wall pain  LINES/TUBES: ETT 11/21>>>11/22 reintubated 11/23 for cardiac arrest>> L femoral HD catheter 11/21>>> R radial a-line 11/21>>>  DISCUSSION: 41 year old type I diabetic with all the classic complications with cardiac arrest in the OR for 2 hours that is now following commands so  no need for cooling.  Rapidly coming off pressors with addressing of acidosis.  Cause of arrest I suspect is acidosis after reperfusion in a patient with no renal failure (ESRD) vs ischemic bowel.  04/17/2017 awake and alert on minimal ventilator settings we will assess for extubation. 04/18/2017 she had a cardiac arrest requiring intubation and CPR for approximately 30 minutes.  She woke up was neurologically intact extensive chest pain following chest compressions x2 to continuous day.  2018 she is weanable no plans for extubation secondary to chest wall pain.  ASSESSMENT / PLAN:  PULMONARY A: ARDS and VDRF post CPR for 2 hours P:   - Full vent support -04/17/2017 extubated and subsequently reintubated 04/19/1999.  Following a cardiac arrest -Follow ABG for completeness -CT angios of chest did not demonstrate pulmonary embolism. -Most likely will not extubate today we will let her undergo hemodialysis.  Continue to monitor for metabolic acidosis. -Although she is weanable I am not sure if she is extubated will due to the fact she has had  chest compressions x2 during cardiac arrest.  Her chest wall extremely tender and I doubt she will adequately ventilate herself once endotracheal tube is removed.  This is complicated by the fact to adequately treat her pain with vascular sedated her to the point she will not breathe.  Therefore no plan for extubation 04/20/2017 at this time.  CARDIOVASCULAR A:  Circulatory Shock, unknown cause, resolving with resolution of acidosis HTN Arrest x2 P:  -Off pressors -  On clevidipine drip suspect she will come off antihypertensive drip when she is extubated and pain is controlled. - EKG monitoring - Cards is following the patient -CT of the abdomen following code shows hematoma adjacent to right femoral artery.  Moderate to large volume of ascites.  Probable trace pulmonary edema in the lungs  RENAL Lab Results  Component Value Date   CREATININE 3.30 (H) 04/20/2017   CREATININE 3.20 (H) 04/20/2017   CREATININE 5.08 (H) 04/19/2017    A:   ESRD Metabolic acidosis P:   - Renal consult on 04/16/2017 - CRRT has been completed on 04/17/2017 -Blood pressure is adequate she can undergo normal hemodialysis. -Intermittent hemodialysis being done by renal -Acidosis has been corrected  GASTROINTESTINAL A:   Concern for ischemic bowel doubt with lactic acid 0.95 P:   - Abdominal CT is done 04/18/2018 which demonstrated probable hematoma in the right superficial femoral artery.  Also notes a large to moderate volume of ascites - NPO -Tube feeding started  HEMATOLOGIC Recent Labs    04/19/17 1730 04/20/17 0432  HGB 7.3* 7.0*    A:   No active issues P:  - CBC - Transfuse per ICU protocol  INFECTIOUS A:   Concern for ischemic bowel or intraabdominal infection lactic acid was .95 04/20/2017 quite a fever 103 cultures were sent for urine and blood.  We will add sputum culture  p:   - Levaquin - Flagyl -04/20/2017 vancomycin added for fever spike to current  antibiotics>> -11/21 blood cultures x2>> -04/19/2017 blood cultures x2>> -11/25 sputum>>   ENDOCRINE CBG (last 3)  Recent Labs    04/19/17 2001 04/19/17 2331 04/20/17 0353  GLUCAP 93 94 96    A:   DM   P:   - ISS - CBG - No basal insulin in medication list, will ask diabetic coordinator to see for basal insulin needs  NEUROLOGIC A:   Awake post code Awake and alert with no neurological deficits noted 04/17/2017 P:  RASS goal: 0 - Fentanyl as needed - Precedex drip - Avoid other sedating medications as able -Pain control as needed.  Her sternum is extremely tender from previous CPR x2  FAMILY  - Updates: Family updated at bedside.  Patient awake and interactive despite being sedated and on mechanical ventilatory support.  She remains extremely tender in the chest and she has had CPR x2 chest compressions.  - Inter-disciplinary family meet or Palliative Care meeting due by:  day 7  cct 30 min  Richardson Landry Minor ACNP Maryanna Shape PCCM Pager [phone] till 1 pm If no answer page [phone] 04/20/2017, 10:32 AM

## 2017-04-20 NOTE — Progress Notes (Signed)
Brief Nutrition Note  Consult received for enteral/tube feeding initiation and management.  Adult Enteral Nutrition Protocol initiated. RD initiated TF recommendations provided in initial assessment on 11/23.  [facility] RD to follow.  Admitting Dx: Peripheral vascular disease  Body mass index is 24.77 kg/m. Pt meets criteria for normal based on current BMI.  Labs:  Recent Labs  Lab 04/19/17 0356 04/19/17 1559 04/20/17 0432  NA 138 139 137  138  K 4.2 5.1 3.4*  3.4*  CL 101 100* 97*  98*  CO2 22 24 23  24   BUN 39* 44* 21*  20  CREATININE 4.54* 5.08* 3.20*  3.30*  CALCIUM 9.2 9.3 8.8*  8.8*  MG 2.3 2.6* 2.0  PHOS 4.6 7.1* 3.1  3.1  GLUCOSE 144* 125* 99  99    Clayton Bibles, MS, RD, LDN [facility] Clinical Dietitian Pager: [phone] After Hours Pager: [phone]

## 2017-04-20 NOTE — Progress Notes (Signed)
04/20/2017 1715 Dr. Ashok Cordia on floor and shown reddened Arterial line site. Arterial line is correlating well with BP cuff. Verbal order Dr. Ashok Cordia to remove arterial line and utilize BP cuff to titrate gtts. Order enacted. Will continue to closely monitor patient.  Philana Younis, Arville Lime

## 2017-04-21 ENCOUNTER — Encounter (HOSPITAL_COMMUNITY): Payer: Self-pay | Admitting: Surgery

## 2017-04-21 ENCOUNTER — Inpatient Hospital Stay (HOSPITAL_COMMUNITY): Payer: Medicare Other

## 2017-04-21 DIAGNOSIS — R0902 Hypoxemia: Secondary | ICD-10-CM

## 2017-04-21 LAB — RENAL FUNCTION PANEL
ALBUMIN: 2.2 g/dL — AB (ref 3.5–5.0)
Albumin: 2.1 g/dL — ABNORMAL LOW (ref 3.5–5.0)
Anion gap: 15 (ref 5–15)
Anion gap: 16 — ABNORMAL HIGH (ref 5–15)
BUN: 34 mg/dL — AB (ref 6–20)
BUN: 20 mg/dL (ref 6–20)
CALCIUM: 8.9 mg/dL (ref 8.9–10.3)
CALCIUM: 8.5 mg/dL — AB (ref 8.9–10.3)
CHLORIDE: 95 mmol/L — AB (ref 101–111)
CO2: 25 mmol/L (ref 22–32)
CO2: 24 mmol/L (ref 22–32)
CREATININE: 3.27 mg/dL — AB (ref 0.44–1.00)
Chloride: 98 mmol/L — ABNORMAL LOW (ref 101–111)
Creatinine, Ser: 4.57 mg/dL — ABNORMAL HIGH (ref 0.44–1.00)
GFR calc Af Amer: 13 mL/min — ABNORMAL LOW (ref >60)
GFR calc non Af Amer: 16 mL/min — ABNORMAL LOW (ref >60)
GFR, EST AFRICAN AMERICAN: 19 mL/min — AB (ref >60)
GFR, EST NON AFRICAN AMERICAN: 11 mL/min — AB (ref >60)
GLUCOSE: 112 mg/dL — AB (ref 65–99)
GLUCOSE: 93 mg/dL (ref 65–99)
PHOSPHORUS: 5.3 mg/dL — AB (ref 2.5–4.6)
POTASSIUM: 4 mmol/L (ref 3.5–5.1)
Phosphorus: 3.7 mg/dL (ref 2.5–4.6)
Potassium: 3 mmol/L — ABNORMAL LOW (ref 3.5–5.1)
SODIUM: 138 mmol/L (ref 135–145)
SODIUM: 135 mmol/L (ref 135–145)

## 2017-04-21 LAB — GLUCOSE, CAPILLARY
GLUCOSE-CAPILLARY: 113 mg/dL — AB (ref 65–99)
GLUCOSE-CAPILLARY: 125 mg/dL — AB (ref 65–99)
GLUCOSE-CAPILLARY: 80 mg/dL (ref 65–99)
Glucose-Capillary: 122 mg/dL — ABNORMAL HIGH (ref 65–99)
Glucose-Capillary: 121 mg/dL — ABNORMAL HIGH (ref 65–99)
Glucose-Capillary: 83 mg/dL (ref 65–99)
Glucose-Capillary: 99 mg/dL (ref 65–99)

## 2017-04-21 LAB — CBC WITH DIFFERENTIAL/PLATELET
BASOS ABS: 0 10*3/uL (ref 0.0–0.1)
BASOS PCT: 0 %
EOS ABS: 0.2 10*3/uL (ref 0.0–0.7)
Eosinophils Relative: 2 %
HEMATOCRIT: 23.3 % — AB (ref 36.0–46.0)
Hemoglobin: 7.1 g/dL — ABNORMAL LOW (ref 12.0–15.0)
Lymphocytes Relative: 9 %
Lymphs Abs: 0.9 10*3/uL (ref 0.7–4.0)
MCH: 28 pg (ref 26.0–34.0)
MCHC: 30.5 g/dL (ref 30.0–36.0)
MCV: 91.7 fL (ref 78.0–100.0)
MONO ABS: 1.3 10*3/uL — AB (ref 0.1–1.0)
MONOS PCT: 12 %
NEUTROS ABS: 7.8 10*3/uL — AB (ref 1.7–7.7)
Neutrophils Relative %: 77 %
Platelets: 221 10*3/uL (ref 150–400)
RBC: 2.54 MIL/uL — ABNORMAL LOW (ref 3.87–5.11)
RDW: 17.9 % — AB (ref 11.5–15.5)
WBC: 10.2 10*3/uL (ref 4.0–10.5)

## 2017-04-21 LAB — URINE CULTURE: Culture: NO GROWTH

## 2017-04-21 LAB — CULTURE, BLOOD (ROUTINE X 2)
CULTURE: NO GROWTH
Culture: NO GROWTH
SPECIAL REQUESTS: ADEQUATE

## 2017-04-21 LAB — MAGNESIUM
MAGNESIUM: 2.2 mg/dL (ref 1.7–2.4)
MAGNESIUM: 1.9 mg/dL (ref 1.7–2.4)

## 2017-04-21 LAB — PROCALCITONIN: PROCALCITONIN: 22.87 ng/mL

## 2017-04-21 LAB — PHOSPHORUS: PHOSPHORUS: 5.3 mg/dL — AB (ref 2.5–4.6)

## 2017-04-21 MED ORDER — DARBEPOETIN ALFA 200 MCG/0.4ML IJ SOSY
200.0000 ug | PREFILLED_SYRINGE | INTRAMUSCULAR | Status: DC
  Administered 2017-04-23: 200 ug via INTRAVENOUS
  Filled 2017-04-21: qty 0.4

## 2017-04-21 MED ORDER — DEXMEDETOMIDINE HCL IN NACL 400 MCG/100ML IV SOLN
0.0000 ug/kg/h | INTRAVENOUS | Status: DC
  Administered 2017-04-21 (×2): 0.4 ug/kg/h via INTRAVENOUS
  Administered 2017-04-22: 0.5 ug/kg/h via INTRAVENOUS
  Filled 2017-04-21 (×4): qty 100

## 2017-04-21 MED ORDER — VANCOMYCIN HCL IN DEXTROSE 1-5 GM/200ML-% IV SOLN
1000.0000 mg | INTRAVENOUS | Status: AC
  Administered 2017-04-23 – 2017-04-25 (×2): 1000 mg via INTRAVENOUS
  Filled 2017-04-21 (×3): qty 200

## 2017-04-21 NOTE — Progress Notes (Signed)
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  The patient was profoundly acidotic and once that was addressed patient had a stable rhythm and was brought to the ICU for CRRT to address profound metabolic acidosis.  Patient was unresponsive upon arrival but with stabilization of BP reportedly became responsive.  PCCM was consulted for vent and vital signs management. Cardiac arrest x2 and 48 hours suspected from profound acidotic state.  SUBJECTIVE/INTERVAL: unable to wean this AM due to MSK chest pain. Temps better controlled with cooling blanket. Nausea with tube feeds and large residuals so held.   VITAL SIGNS: BP (!) 174/71   Pulse 87   Temp 99 F (37.2 C) (Axillary)   Resp 20   Ht 6\' 4"  (1.93 m)   Wt 94.2 kg (207 lb 10.8 oz)   LMP 10/09/2015 Comment: pt on dialysis  SpO2 100%   BMI 25.28 kg/m   HEMODYNAMICS:    VENTILATOR SETTINGS: Vent Mode: PRVC FiO2 (%):  [30 %-40 %] 30 % Set Rate:  [24 bmp] 24 bmp Vt Set:  [244 mL] 660 mL PEEP:  [5 cmH20-8 cmH20] 5 cmH20 Plateau Pressure:  [20 WNU27-25 cmH20] 23 cmH20  INTAKE / OUTPUT: I/O last 3 completed shifts: In: 3043.6 [I.V.:1993.6; NG/GT:150; IV Piggyback:900] Out: 3664 [Urine:35; Emesis/NG output:1800; Drains:835; Other:3750]  PHYSICAL EXAMINATION:  General: adult female of normal body habitus in chest wall pain related distress HEENT:[place]/AT, PERRL, on vent PSY: Alert, appears oriented on vent. Gestures to c/o chest pain.  Neuro: Intact CV: RRR, no MRG PULM: Coarse bilateral even after suctioning, still sounds  like upper airway secretions.  GI: soft, non-tender, non-distended, Jackson-Pratt drainage with moderate bloody drainage Extremities/skin: Bilateral lower extremities with ischemic changes, right foot worse than left  LABS:  BMET Recent Labs  Lab 04/20/17 0432 04/20/17 1500 04/21/17 0521  NA 137  138 138 138  K 3.4*  3.4* 3.8 4.0  CL 97*  98* 98* 98*  CO2 23  24 25 25   BUN 21*  20 26* 34*  CREATININE 3.20*  3.30* 3.83* 4.57*  GLUCOSE 99  99 115* 112*    Electrolytes Recent Labs  Lab 04/20/17 0432 04/20/17 1500 04/21/17 0521 04/21/17 0522  CALCIUM 8.8*  8.8* 8.8* 8.9  --   MG 2.0 2.0  --  2.2  PHOS 3.1  3.1 4.3 5.3* 5.3*    CBC Recent Labs  Lab 04/19/17 0356 04/19/17 1730 04/20/17 0432 04/21/17 0522  WBC 9.0  --  9.3 10.2  HGB 7.5* 7.3* 7.0* 7.1*  HCT 23.4* 23.0* 22.3* 23.3*  PLT 199  --  205 221    Coag's Recent Labs  Lab 04/15/17 1356 04/18/17 0635  APTT 39*  --   INR 1.14 1.31    Sepsis Markers Recent Labs  Lab 04/18/17 0606 04/18/17 1610 04/20/17 1500 04/21/17 0522  LATICACIDVEN 9.2* 1.0  --   --   PROCALCITON  --   --  26.08 22.87    ABG Recent Labs  Lab 04/17/17 1053 04/18/17 0624 04/18/17 0925  PHART 7.356 7.207* 7.486*  PCO2ART 48.1*  50.1* 33.6  PO2ART 65.0* 37.0* 268*    Liver Enzymes Recent Labs  Lab 04/15/17 1356  04/18/17 0635  04/20/17 0432 04/20/17 1500 04/21/17 0521  AST 20  --  290*  --   --   --   --   ALT 15  --  140*  --   --   --   --   ALKPHOS 449*  --  281*  --   --   --   --   BILITOT 0.7  --  1.5*  --   --   --   --   ALBUMIN 2.8*   < > 2.2*   < > 2.3* 2.2* 2.2*   < > = values in this interval not displayed.    Cardiac Enzymes Recent Labs  Lab 04/18/17 0635  TROPONINI 0.67*    Glucose Recent Labs  Lab 04/20/17 1136 04/20/17 1531 04/20/17 1947 04/21/17 0049 04/21/17 0454 04/21/17 0806  GLUCAP 129* 104* 115* 125* 122* 121*    Imaging Dg Chest Port 1 View  Result Date:  04/21/2017 CLINICAL DATA:  41 year old female with respiratory failure, intubated EXAM: PORTABLE CHEST 1 VIEW COMPARISON:  Prior chest x-ray 04/20/2017 FINDINGS: Stable cardiomegaly. The patient remains intubated. The tip of the endotracheal tube is 5.7 cm above the carina. The gastric tube remains in place. The tip is not identified but lies below the diaphragm, presumably within the stomach. Persistent pulmonary vascular congestion bordering on mild edema, no significant interval change. Patchy airspace opacities overlying the right lung base which may reflect atelectasis or infiltrate. No acute osseous abnormality. IMPRESSION: 1. Stable and satisfactory support apparatus. 2. Stable cardiomegaly and pulmonary vascular congestion bordering on mild interstitial edema. 3. Similar degree of right basilar airspace opacity which may reflect atelectasis or infiltrate. Electronically Signed   By: Jacqulynn Cadet M.D.   On: 04/21/2017 07:53     STUDIES:  Abdominal/chest CT 11/21>>> no evidence of pulmonary embolism.  Nondiagnostic CT of the abdomen and pelvis.  Probable hematoma in the right superficial femoral Artery.  Moderate to large volume this ascites Head CT 11/21>>> not done KUB 11/25 no evidence ileus or obstruction.   CULTURES: Blood 11/21>>> Blood culture 04/19/2017>> Urine culture 04/20/2017 done via in and out cath with 30 cc obtained>> Sputum culture 04/20/2017>>  ANTIBIOTICS: Levaquin 11/21>>> Flagyl 11/21>>> Vancomycin 04/20/2017 >>  SIGNIFICANT EVENTS:  11/21>>> Cardiac arrest in OR 04/17/2017 extubated 04/18/2017>> second cardiac arrest requiring intubation and 30 minutes of CPR 04/20/2017 in and out cath with 30 cc obtained 04/20/2017 no extubation at this time secondary to chest wall pain  LINES/TUBES: ETT 11/21>>>11/22 reintubated 11/23 for cardiac arrest>> L femoral HD catheter 11/21>>> R radial a-line 11/21>>> 11/26  DISCUSSION: 41 year old type I diabetic with all  the classic complications with cardiac arrest in the OR for 2 hours that is now following commands so no need for cooling.  Rapidly coming off pressors with addressing of acidosis.  Cause of arrest suspected to be acidosis after reperfusion in a patient with ESRD vs ischemic bowel.  Slow to wean due to chest pain but wakes up and is neuro intact.   ASSESSMENT / PLAN:  PULMONARY A: ARDS and VDRF post CPR for 2 hours > improved Difficulty weaning due to chest wall pain Mild pulmonary edema P:   Full vent support Follow CXR Is weanable, but limited due to chest wall pain. Will have to continue to evaluate this over the days to come.  VAP  bundle  CARDIOVASCULAR A:  Circulatory Shock, unknown cause, resolving with resolution of acidosis (resolved) HTN Arrest x2 P:  Continue clevidipine infusion for SBP goal 160-170 EKG monitoring Cardiology following  RENAL A:   ESRD Metabolic acidosis (resolved) P:   Nephrology following for HD (typically MWF) (now off CRRT) BMP daily  GASTROINTESTINAL A:   Nutrition Ileus? Transaminitis - Abdominal CT is done 04/18/2018 which demonstrated probable hematoma in the right superficial femoral artery.  Also notes a large to moderate volume of ascites. KUB 11/25 with no evidence ileus or obstruction. P:   Holding TF due to nausea and large residuals. PPI  HEMATOLOGIC A:   No active issues P:  CBC Transfuse per ICU protocol Subq heparin  INFECTIOUS A:   Concern for ischemic bowel or intraabdominal infection lactic acid was .95 Continued fevers  Occult sepsis concern bacteremia, intraabdominal  -11/21 blood cultures x2>> -04/19/2017 blood cultures x2>> -11/25 sputum>>  p:   Levaquin 11/21 > Flagyl 11/21 > Vancomycin added 11/25 (continued fevers) >> Follow cultures Follow PCT (26>23) Limit tylenol with bump in AST/ALT.   ENDOCRINE A:   DM   P:   SSI CBG  NEUROLOGIC A:   Acute encephalopathy (improved)  P:   RASS  goal: 0 to -1 Fentanyl infusion Will add back precedex Pain control as needed.  Her sternum is extremely tender from previous CPR x2  FAMILY  - Updates: No family available  - Inter-disciplinary family meet or Palliative Care meeting due by:  day [place], AGACNP-BC [facility] Pulmonology/Critical Care Pager [phone] or [phone]  04/21/2017 10:50 AM  Attending Note:  41 year old female with extensive PMH who presents to the ICU post cardiac arrest in the OR likely due to acidosis post reperfusion.  Patient was admitted to the ICU, stabilized and subsequently extubated.  Was reintubated post arrest the next day due to SOB and suspected respiratory acidosis that I suspect is due to flail chest.  Received a total of 2.5 hours of CPR.  I On exam today, diffuse tenderness and patient complains that chest hurts while breathing even on 10/5.  I suspect patient has significant flail chest that is not seen on CXR that I reviewed myself.  Pulmonary infiltrate noted.  Continue vanc with levaquin and metronidazole.  F/U on cultures.  Chest CT ordered to evaluate ribs.  The patient is critically ill with multiple organ systems failure and requires high complexity decision making for assessment and support, frequent evaluation and titration of therapies, application of advanced monitoring technologies and extensive interpretation of multiple databases.   Critical Care Time devoted to patient care services described in this note is  35  Minutes. This time reflects time of care of this signee Dr Jennet Maduro. This critical care time does not reflect procedure time, or teaching time or supervisory time of PA/NP/Med student/Med Resident etc but could involve care discussion time.  Rush Farmer, M.D. [facility] Pulmonary/Critical Care Medicine. Pager: [phone]. After hours pager: [phone].

## 2017-04-21 NOTE — Progress Notes (Signed)
Nutrition Follow-up  DOCUMENTATION CODES:   Non-severe (moderate) malnutrition in context of chronic illness  INTERVENTION:   -Recommend trial of re-initiation of TF within 24 hours.  -Recommend changing to Vital 1.5 formula @ initial rate of 20 ml/hr with titration to goal of 55 ml/hr with Pro-Stat 30 mL TID providing 135 g of protein, 2280 kcals and 1003 mL of free water. Monitor electrolytes.  -Recommend considering trial of reglan or erythromycin -No BM since admission (at least 5 days); senna and colace ordered on 11/24. If continues without BM, recommend further intervention regarding bowel regimen (suppository??).   NUTRITION DIAGNOSIS:   Moderate Malnutrition related to chronic illness(ESRD and type 1 DM) as evidenced by mild fat depletion, moderate fat depletion, mild muscle depletion, moderate muscle depletion.  Continues  GOAL:   Patient will meet greater than or equal to 90% of their needs  Not Met  MONITOR:   Vent status, I & O's  REASON FOR ASSESSMENT:   Ventilator    ASSESSMENT:   Pt with PMH of type 1 DM, ESRD on HD, PVD admitted for elective management of PAD. While in OR for femoral endarterectomy she developed severe hypotension and metabolic acidosis, cardiac arrest with resuscitative efforts for 2 hrs. Pt transferred to ICU for CRRT, extubated. On 11/23 at 0600 pt with second PEA arrest requiring CPR for 30 min and re-intubated.    Patient is currently intubated on ventilator support MV: 13.9 L/min Temp (24hrs), Avg:100.1 F (37.8 C), Min:97.9 F (36.6 C), Max:101.8 F (38.8 C)  TF started yesterday, Nepro at 40 ml/hr but per nsg notes, shortly after initiating pt c/o abdominal pain and vomited. TF currently on hold.  Pt with inadequate nutrition x 5 days  Noted abdomen distended, firm. No BM since admission. Noted moderate to large volume of ascites noted on CT scan from 11/23; no sign of ileus or obstruction on abdominal xray.   Pt off CRRT,  received IHD today  EDW 89 kg per MD note. Current wt 91.4 kg  Labs: phosphorus 5.3 Meds: reviewed; senokot and colace ordered 11/24  Diet Order:  Diet NPO time specified  EDUCATION NEEDS:   Not appropriate for education at this time  Skin:  Skin Assessment: Skin Integrity Issues: Skin Integrity Issues:: (Cellulitis: BLE, open scaral wound, degloving R toe, DM wound L toe)  Last BM:  11/20  Height:   Ht Readings from Last 1 Encounters:  04/21/17 _0  (1.93 m)    Weight:   Wt Readings from Last 1 Encounters:  04/21/17 201 lb 8 oz (91.4 kg)    Ideal Body Weight:  81.8 kg  BMI:  Body mass index is 24.53 kg/m.  Estimated Nutritional Needs:   Kcal:  2359 kcals  Protein:  134-178 g (max 223 g per day)  Fluid:  1000 mL plus UOP   [employer] MS, RD, LDN, CNSC [phone] Pager  [phone] Weekend/On-Call Pager

## 2017-04-21 NOTE — Progress Notes (Signed)
  Progress Note    04/21/2017 8:43 AM 5 Days Post-Op  Subjective:  Patient remains intubated.  Able to follow commands and communicate with hands.   Vitals:   04/21/17 0802 04/21/17 0830  BP:  (!) 175/79  Pulse:  85  Resp:  (!) 24  Temp: 99.5 F (37.5 C)   SpO2:  99%   Physical Exam: Cardiac:  RRR Lungs:  Mechanical ventilation Incisions:  R groin incision stable; >100cc serosanguinous drainage in JP over the past 24 hours Extremities:  R DP by doppler; gangrenous toes of R foot dry, without drainage; no significant erythema or ascending lymphangitis noted; L foot with PT by doppler; L groin trialysis catheter site without obvious infection Abdomen:  Soft; chest and upper abdomen tender to light palpation   CBC    Component Value Date/Time   WBC 10.2 04/21/2017 0522   RBC 2.54 (L) 04/21/2017 0522   HGB 7.1 (L) 04/21/2017 0522   HCT 23.3 (L) 04/21/2017 0522   PLT 221 04/21/2017 0522   MCV 91.7 04/21/2017 0522   MCH 28.0 04/21/2017 0522   MCHC 30.5 04/21/2017 0522   RDW 17.9 (H) 04/21/2017 0522   LYMPHSABS 0.9 04/21/2017 0522   MONOABS 1.3 (H) 04/21/2017 0522   EOSABS 0.2 04/21/2017 0522   BASOSABS 0.0 04/21/2017 0522    BMET    Component Value Date/Time   NA 138 04/21/2017 0521   K 4.0 04/21/2017 0521   CL 98 (L) 04/21/2017 0521   CO2 25 04/21/2017 0521   GLUCOSE 112 (H) 04/21/2017 0521   BUN 34 (H) 04/21/2017 0521   CREATININE 4.57 (H) 04/21/2017 0521   CALCIUM 8.9 04/21/2017 0521   GFRNONAA 11 (L) 04/21/2017 0521   GFRAA 13 (L) 04/21/2017 0521    INR    Component Value Date/Time   INR 1.31 04/18/2017 0635     Intake/Output Summary (Last 24 hours) at 04/21/2017 0843 Last data filed at 04/21/2017 0800 Gross per 24 hour  Intake 2510.54 ml  Output 1690 ml  Net 820.54 ml     Assessment/Plan:  41 y.o. female is s/p R CFA endarterectomy with cardiac arrest intra-operatively5 Days Post-Op   Continue JP drain R groin Agree with IV antibiotic  regimen Dry gangrene R foot stable; no obvious need for urgent TMA at this time No obvious infection L groin trialysis catheter; can d/c if this is suspected as patient is dialyzing from AVF Critical care per critical care team  DVT prophylaxis:  subq heparin   Dagoberto Ligas, PA-C [facility] [phone] 04/21/2017 8:43 AM

## 2017-04-21 NOTE — Progress Notes (Signed)
Patient transported from 2H18 to CT and back without any complications.

## 2017-04-21 NOTE — Progress Notes (Signed)
04/21/2017 0830 RT attempted ventilator weaning per protocol. Within 10 minutes, pt. Holding chest/coughing, complaining of severe pain. O2 saturations decreased to 83 %. Placed back on full support by RT due to failed wean. Fentanyl bolus given per prior orders for pain and gtt increased to prior settings. 0840, pt. arousable and denies pain at this time. o2 saturations 98% on full support. Will continue to closely monitor patient.

## 2017-04-21 NOTE — Progress Notes (Addendum)
[facility] Progress Note  Subjective: wts down 102kg > 94kg since admit.  3L off this am.   Vitals:   04/21/17 1200 04/21/17 1213 04/21/17 1215 04/21/17 1230  BP: (!) 173/69 (!) 173/69 (!) 170/84 (!) 172/77  Pulse: 83 86 85 85  Resp: (!) 24 (!) 24 (!) 24 (!) 24  Temp:      TempSrc:      SpO2: 100% 98% 100% 100%  Weight:      Height:        Inpatient medications: . amLODipine  10 mg Oral Daily  . Chlorhexidine Gluconate Cloth  6 each Topical Daily  . cloNIDine  0.3 mg Per NG tube TID  . docusate  100 mg Per Tube BID  . feeding supplement (PRO-STAT SUGAR FREE 64)  60 mL Per Tube BID  . heparin  5,000 Units Subcutaneous Q8H  . insulin aspart  2-6 Units Subcutaneous Q4H  . pantoprazole sodium  40 mg Per Tube Daily  . sennosides  5 mL Per Tube BID  . sodium chloride flush  10-40 mL Intracatheter Q12H  . spironolactone  100 mg Per NG tube Daily   . sodium chloride    . sodium chloride    . clevidipine 20 mg/hr (04/21/17 1100)  . dexmedetomidine (PRECEDEX) IV infusion    . fentaNYL infusion INTRAVENOUS 325 mcg/hr (04/21/17 1100)  . levofloxacin (LEVAQUIN) IV Stopped (04/20/17 1745)  . metronidazole Stopped (04/21/17 0846)  . sodium chloride     sodium chloride, sodium chloride, acetaminophen (TYLENOL) oral liquid 160 mg/5 mL, alteplase, fentaNYL (SUBLIMAZE) injection, heparin, heparin, hydrALAZINE, ipratropium-albuterol, labetalol, lidocaine (PF), lidocaine-prilocaine, midazolam, ondansetron (ZOFRAN) IV, pentafluoroprop-tetrafluoroeth, sodium chloride, sodium chloride flush  Exam: On vent, follows simple commands, eyes closed Chest clear bilat RRR no mrg Abd distended, nontender, +BS Ext trace edema L groin trialysis cath/ LUE AVF+bruit  Dialysis:  MWF  4h 46min   89kg (not getting there)   Hep 2000  LFA AVF -hect 7 ug -mircera 225 due 11/28 -last Hb 9.3, Ca 8.7/ P 9, pth 228 -sensipar 60/ auryxia  -home BP Rx : norvasc 10/ clon 0.3 tid/ hydralazine  100 tid/ labetalol 300 tid/ aldomet 500 qd      Impression: 1. SP cardiac arrest - 11/21, CPR x 2 hrs. 2nd arrest on 11/23 2. VDRF -  CXR improving, vasc congestion still 3. SP R fem artery endarterectomy  - 11/21 by VVS 4. Metab acidosis - resolved 5. Shock - resolved 6. HTN - resumed norvasc/ clonidine here, let BP's run on high side per CCM, on mult BP meds at home and baseline BP's are high 7. ESRD - sp CRRT, on HD this am 8. Anemia of CKD - Hb 7-8 range. Ordered darbe max dose to get on 11/28. Transfuse prn.  9. Volume - is still over dry wt by 5kg 10. DM - per primary   Plan - HD today as above   Kelly Splinter MD [place] pager [phone]   04/21/2017, 12:39 PM   Recent Labs  Lab 04/20/17 0432 04/20/17 1500 04/21/17 0521 04/21/17 0522  NA 137  138 138 138  --   K 3.4*  3.4* 3.8 4.0  --   CL 97*  98* 98* 98*  --   CO2 23  24 25 25   --   GLUCOSE 99  99 115* 112*  --   BUN 21*  20 26* 34*  --   CREATININE 3.20*  3.30* 3.83* 4.57*  --  CALCIUM 8.8*  8.8* 8.8* 8.9  --   PHOS 3.1  3.1 4.3 5.3* 5.3*   Recent Labs  Lab 04/15/17 1356  04/18/17 0635  04/20/17 0432 04/20/17 1500 04/21/17 0521  AST 20  --  290*  --   --   --   --   ALT 15  --  140*  --   --   --   --   ALKPHOS 449*  --  281*  --   --   --   --   BILITOT 0.7  --  1.5*  --   --   --   --   PROT 7.0  --  5.8*  --   --   --   --   ALBUMIN 2.8*   < > 2.2*   < > 2.3* 2.2* 2.2*   < > = values in this interval not displayed.   Recent Labs  Lab 04/19/17 0356 04/19/17 1730 04/20/17 0432 04/21/17 0522  WBC 9.0  --  9.3 10.2  NEUTROABS 7.3  --  7.3 7.8*  HGB 7.5* 7.3* 7.0* 7.1*  HCT 23.4* 23.0* 22.3* 23.3*  MCV 90.3  --  91.4 91.7  PLT 199  --  205 221   Iron/TIBC/Ferritin/ %Sat    Component Value Date/Time   IRON 124 10/31/2014 0405   TIBC 157 (L) 10/31/2014 0405   FERRITIN 780 (H) 10/31/2014 0405   IRONPCTSAT 79 (H) 10/31/2014 0405

## 2017-04-22 ENCOUNTER — Inpatient Hospital Stay (HOSPITAL_COMMUNITY): Payer: Medicare Other

## 2017-04-22 DIAGNOSIS — L8990 Pressure ulcer of unspecified site, unspecified stage: Secondary | ICD-10-CM

## 2017-04-22 DIAGNOSIS — J9601 Acute respiratory failure with hypoxia: Secondary | ICD-10-CM

## 2017-04-22 LAB — GLUCOSE, CAPILLARY
GLUCOSE-CAPILLARY: 115 mg/dL — AB (ref 65–99)
GLUCOSE-CAPILLARY: 121 mg/dL — AB (ref 65–99)
GLUCOSE-CAPILLARY: 116 mg/dL — AB (ref 65–99)
Glucose-Capillary: 103 mg/dL — ABNORMAL HIGH (ref 65–99)
Glucose-Capillary: 137 mg/dL — ABNORMAL HIGH (ref 65–99)
Glucose-Capillary: 95 mg/dL (ref 65–99)

## 2017-04-22 LAB — RENAL FUNCTION PANEL
ANION GAP: 20 — AB (ref 5–15)
Albumin: 2 g/dL — ABNORMAL LOW (ref 3.5–5.0)
BUN: 30 mg/dL — ABNORMAL HIGH (ref 6–20)
CHLORIDE: 96 mmol/L — AB (ref 101–111)
CO2: 19 mmol/L — AB (ref 22–32)
Calcium: 8.6 mg/dL — ABNORMAL LOW (ref 8.9–10.3)
Creatinine, Ser: 4.69 mg/dL — ABNORMAL HIGH (ref 0.44–1.00)
GFR calc non Af Amer: 11 mL/min — ABNORMAL LOW (ref >60)
GFR, EST AFRICAN AMERICAN: 12 mL/min — AB (ref >60)
GLUCOSE: 110 mg/dL — AB (ref 65–99)
POTASSIUM: 3.5 mmol/L (ref 3.5–5.1)
Phosphorus: 5.6 mg/dL — ABNORMAL HIGH (ref 2.5–4.6)
Sodium: 135 mmol/L (ref 135–145)

## 2017-04-22 LAB — COMPREHENSIVE METABOLIC PANEL
ALBUMIN: 2.2 g/dL — AB (ref 3.5–5.0)
ALK PHOS: 218 U/L — AB (ref 38–126)
ALT: 94 U/L — AB (ref 14–54)
AST: 54 U/L — ABNORMAL HIGH (ref 15–41)
Anion gap: 18 — ABNORMAL HIGH (ref 5–15)
BILIRUBIN TOTAL: 1.8 mg/dL — AB (ref 0.3–1.2)
BUN: 24 mg/dL — ABNORMAL HIGH (ref 6–20)
CALCIUM: 8.8 mg/dL — AB (ref 8.9–10.3)
CO2: 23 mmol/L (ref 22–32)
CREATININE: 3.82 mg/dL — AB (ref 0.44–1.00)
Chloride: 96 mmol/L — ABNORMAL LOW (ref 101–111)
GFR calc Af Amer: 16 mL/min — ABNORMAL LOW (ref >60)
GFR calc non Af Amer: 14 mL/min — ABNORMAL LOW (ref >60)
GLUCOSE: 114 mg/dL — AB (ref 65–99)
Potassium: 3.3 mmol/L — ABNORMAL LOW (ref 3.5–5.1)
SODIUM: 137 mmol/L (ref 135–145)
Total Protein: 6.3 g/dL — ABNORMAL LOW (ref 6.5–8.1)

## 2017-04-22 LAB — BLOOD GAS, ARTERIAL
Acid-Base Excess: 0.2 mmol/L (ref 0.0–2.0)
Bicarbonate: 22.9 mmol/L (ref 20.0–28.0)
DRAWN BY: 41977
FIO2: 30
LHR: 24 {breaths}/min
MECHVT: 660 mL
O2 SAT: 94 %
PEEP/CPAP: 5 cmH2O
PH ART: 7.514 — AB (ref 7.350–7.450)
Patient temperature: 98.6
pCO2 arterial: 28.6 mmHg — ABNORMAL LOW (ref 32.0–48.0)
pO2, Arterial: 69 mmHg — ABNORMAL LOW (ref 83.0–108.0)

## 2017-04-22 LAB — CBC WITH DIFFERENTIAL/PLATELET
BASOS ABS: 0 10*3/uL (ref 0.0–0.1)
BASOS PCT: 0 %
Eosinophils Absolute: 0.3 10*3/uL (ref 0.0–0.7)
Eosinophils Relative: 2 %
HEMATOCRIT: 24.5 % — AB (ref 36.0–46.0)
HEMOGLOBIN: 7.7 g/dL — AB (ref 12.0–15.0)
LYMPHS PCT: 8 %
Lymphs Abs: 0.9 10*3/uL (ref 0.7–4.0)
MCH: 29.1 pg (ref 26.0–34.0)
MCHC: 31.4 g/dL (ref 30.0–36.0)
MCV: 92.5 fL (ref 78.0–100.0)
MONOS PCT: 13 %
Monocytes Absolute: 1.4 10*3/uL — ABNORMAL HIGH (ref 0.1–1.0)
NEUTROS ABS: 7.9 10*3/uL — AB (ref 1.7–7.7)
NEUTROS PCT: 77 %
Platelets: 245 10*3/uL (ref 150–400)
RBC: 2.65 MIL/uL — ABNORMAL LOW (ref 3.87–5.11)
RDW: 18.2 % — ABNORMAL HIGH (ref 11.5–15.5)
WBC: 10.4 10*3/uL (ref 4.0–10.5)

## 2017-04-22 LAB — PROTIME-INR
INR: 1.2
Prothrombin Time: 15.1 seconds (ref 11.4–15.2)

## 2017-04-22 LAB — PHOSPHORUS: Phosphorus: 5 mg/dL — ABNORMAL HIGH (ref 2.5–4.6)

## 2017-04-22 LAB — PROCALCITONIN: Procalcitonin: 18.61 ng/mL

## 2017-04-22 LAB — CULTURE, RESPIRATORY
CULTURE: NORMAL
SPECIAL REQUESTS: NORMAL

## 2017-04-22 LAB — MAGNESIUM: Magnesium: 2.1 mg/dL (ref 1.7–2.4)

## 2017-04-22 MED ORDER — PROPOFOL 500 MG/50ML IV EMUL
5.0000 ug/kg/min | Freq: Once | INTRAVENOUS | Status: DC
  Administered 2017-04-22: 50 ug/kg/min via INTRAVENOUS
  Filled 2017-04-22: qty 100

## 2017-04-22 MED ORDER — ETOMIDATE 2 MG/ML IV SOLN
40.0000 mg | Freq: Once | INTRAVENOUS | Status: AC
  Administered 2017-04-22: 20 mg via INTRAVENOUS
  Filled 2017-04-22: qty 20

## 2017-04-22 MED ORDER — MIDAZOLAM HCL 2 MG/2ML IJ SOLN
4.0000 mg | Freq: Once | INTRAMUSCULAR | Status: AC
  Administered 2017-04-22: 4 mg via INTRAVENOUS
  Filled 2017-04-22: qty 4

## 2017-04-22 MED ORDER — POTASSIUM CHLORIDE 10 MEQ/50ML IV SOLN
INTRAVENOUS | Status: AC
  Administered 2017-04-22: 10 meq via INTRAVENOUS
  Filled 2017-04-22: qty 50

## 2017-04-22 MED ORDER — VECURONIUM BROMIDE 10 MG IV SOLR
10.0000 mg | Freq: Once | INTRAVENOUS | Status: AC
  Administered 2017-04-22: 10 mg via INTRAVENOUS
  Filled 2017-04-22: qty 10

## 2017-04-22 MED ORDER — BISACODYL 10 MG RE SUPP
10.0000 mg | Freq: Every day | RECTAL | Status: DC | PRN
  Administered 2017-04-22: 10 mg via RECTAL
  Filled 2017-04-22: qty 1

## 2017-04-22 MED ORDER — PROPOFOL 1000 MG/100ML IV EMUL
INTRAVENOUS | Status: AC
  Administered 2017-04-22: 50 ug/kg/min
  Filled 2017-04-22: qty 100

## 2017-04-22 MED ORDER — PROPOFOL 500 MG/50ML IV EMUL
5.0000 ug/kg/min | Freq: Once | INTRAVENOUS | Status: AC
  Administered 2017-04-22: 50 ug/kg/min via INTRAVENOUS
  Filled 2017-04-22: qty 50

## 2017-04-22 MED ORDER — FENTANYL CITRATE (PF) 100 MCG/2ML IJ SOLN
200.0000 ug | Freq: Once | INTRAMUSCULAR | Status: DC
  Filled 2017-04-22: qty 4

## 2017-04-22 MED ORDER — PROPOFOL 1000 MG/100ML IV EMUL
5.0000 ug/kg/min | INTRAVENOUS | Status: AC
  Administered 2017-04-22 – 2017-04-23 (×6): 50 ug/kg/min via INTRAVENOUS
  Filled 2017-04-22: qty 100
  Filled 2017-04-22: qty 200
  Filled 2017-04-22: qty 100
  Filled 2017-04-22 (×2): qty 200

## 2017-04-22 MED ORDER — METRONIDAZOLE IN NACL 5-0.79 MG/ML-% IV SOLN
500.0000 mg | Freq: Three times a day (TID) | INTRAVENOUS | Status: AC
  Administered 2017-04-23 – 2017-04-25 (×9): 500 mg via INTRAVENOUS
  Filled 2017-04-22 (×9): qty 100

## 2017-04-22 MED ORDER — CHLORHEXIDINE GLUCONATE 0.12% ORAL RINSE (MEDLINE KIT)
15.0000 mL | Freq: Two times a day (BID) | OROMUCOSAL | Status: DC
  Administered 2017-04-22 – 2017-04-24 (×4): 15 mL via OROMUCOSAL

## 2017-04-22 MED ORDER — POTASSIUM CHLORIDE 10 MEQ/50ML IV SOLN
10.0000 meq | INTRAVENOUS | Status: AC
  Administered 2017-04-22 (×4): 10 meq via INTRAVENOUS
  Filled 2017-04-22: qty 50

## 2017-04-22 MED ORDER — PROPOFOL 1000 MG/100ML IV EMUL
INTRAVENOUS | Status: AC
  Administered 2017-04-22: 50 ug/kg/min via INTRAVENOUS
  Filled 2017-04-22: qty 100

## 2017-04-22 MED ORDER — ORAL CARE MOUTH RINSE
15.0000 mL | Freq: Four times a day (QID) | OROMUCOSAL | Status: DC
  Administered 2017-04-22 – 2017-04-24 (×10): 15 mL via OROMUCOSAL

## 2017-04-22 NOTE — Progress Notes (Signed)
MD ordered propofol to continue 5 to 50 from time of tracheotomy until tomorrow. Did not realize the previous order did not reflect this and just entered verbal order for continuance of gtt.

## 2017-04-22 NOTE — Progress Notes (Signed)
eLink Physician-Brief Progress Note Patient Name: [name] DOB: [birth year] MRN: [id number]   Date of Service  04/22/2017  HPI/Events of Note  Acute hypoxia likely secondary to mucous plugging. Respiratory therapy suctioning bloody secretions. No resistance to bagging or suctioning. Morning chest x-ray reviewed without obvious mucous plug. Sats recovered. Multiple staff members at bedside.   eICU Interventions  Continuing close monitoring.      Intervention Category Major Interventions: Respiratory failure - evaluation and management  Tera Partridge 04/22/2017, 6:58 AM

## 2017-04-22 NOTE — Procedures (Signed)
Percutaneous Tracheostomy Placement  Consent from family.  Patient sedated, paralyzed and position.  Placed on 100% FiO2 and RR matched.  Area cleaned and draped.  Lidocaine/epi injected.  Skin incision done followed by blunt dissection.  Trachea palpated then punctured, catheter passed and visualized bronchoscopically.  Wire placed and visualized.  Catheter removed.  Airway then entered and dilated.  Size 6 cuffed shiley trach placed and visualized bronchoscopically well above carina.  Good volume returns.  Patient tolerated the procedure well without complications.  Minimal blood loss.  CXR ordered and pending.  Wesam G. Yacoub, M.D.  Pulmonary/Critical Care Medicine. Pager: [phone]. After hours pager: [phone].  

## 2017-04-22 NOTE — Procedures (Signed)
Bedside Tracheostomy Insertion Procedure Note   Patient Details:   Name: [name] DOB: 05-Nov-1975 MRN: [id number]  Procedure: Tracheostomy  Pre Procedure Assessment: ET Tube Size: 7.5 ET Tube secured at lip (cm): 23 Bite block in place: No Breath Sounds: Clear  Post Procedure Assessment: BP (!) 151/73   Pulse 74   Temp 99.1 F (37.3 C) (Rectal) Comment (Src): probe w/ cooling blanket  Resp (!) 24   Ht 6\' 4"  (1.93 m)   Wt 200 lb 9.9 oz (91 kg)   LMP 10/09/2015 Comment: pt on dialysis  SpO2 100%   BMI 24.42 kg/m  O2 sats: stable throughout Complications: No apparent complications Patient did tolerate procedure well Tracheostomy Brand:Shiley Tracheostomy Style:Cuffed Tracheostomy Size: 6 Tracheostomy Secured JDY:[id number], sutures Tracheostomy Placement Confirmation:Trach cuff visualized and in place and Chest X ray ordered for placement    Kathie Dike 04/22/2017, 11:45 AM

## 2017-04-22 NOTE — Progress Notes (Signed)
Responded to PT in RESP distress. Suction several brown plugs for airway

## 2017-04-22 NOTE — Progress Notes (Signed)
PULMONARY / CRITICAL CARE MEDICINE   Name: [name] MRN: [id number] DOB: 1976/03/22    ADMISSION DATE:  04/16/2017 CONSULTATION DATE:  04/16/2017  REFERRING MD:  Trula Slade VVS  CHIEF COMPLAINT:  Cardiac arrest and respiratory failure  HISTORY OF PRESENT ILLNESS:   41 year old with type I diabetes with vascular and renal complications who was in the OR for femoral endarterectomy and suffered a cardiac arrest with resuscitative efforts for 2 hours.  The patient was profoundly acidotic and once that was addressed patient had a stable rhythm and was brought to the ICU for CRRT to address profound metabolic acidosis.  Patient was unresponsive upon arrival but with stabilization of BP reportedly became responsive.  PCCM was consulted for vent and vital signs management. Cardiac arrest x2 and 48 hours suspected from profound acidotic state.  SUBJECTIVE/INTERVAL: significant mucous plugging this AM leading to near respiratory arrest. CT demonstrated several rib fractures and sternum fracture. Chest pain somewhat improved.   VITAL SIGNS: BP (!) 155/77 (BP Location: Right Arm)   Pulse 77   Temp 99.1 F (37.3 C) (Rectal) Comment (Src): probe w/ cooling blanket  Resp 18   Ht 6\' 4"  (1.93 m)   Wt 91 kg (200 lb 9.9 oz)   LMP 10/09/2015 Comment: pt on dialysis  SpO2 100%   BMI 24.42 kg/m   HEMODYNAMICS:    VENTILATOR SETTINGS: Vent Mode: PRVC FiO2 (%):  [30 %-50 %] 50 % Set Rate:  [24 bmp] 24 bmp Vt Set:  [517 mL] 660 mL PEEP:  [5 cmH20] 5 cmH20 Plateau Pressure:  [21 GYF74-94 cmH20] 22 cmH20  INTAKE / OUTPUT: I/O last 3 completed shifts: In: 4967 [I.V.:2701; NG/GT:30; IV Piggyback:400] Out: 5916 [Emesis/NG output:2150; Drains:350; Other:3000]  PHYSICAL EXAMINATION:  General: adult female on vent in NAD HEENT:/AT, PERRL, no JVD Neuro: Intact. Follows commnands CV: RRR, no MRG PULM: Coarse bilateral breath sounds GI: Soft, non-tender, non-distended Extremities/skin:  Bilateral lower extremities with ischemic changes, right foot worse than left  LABS:  BMET Recent Labs  Lab 04/21/17 0521 04/21/17 1851 04/22/17 0323  NA 138 135 137  K 4.0 3.0* 3.3*  CL 98* 95* 96*  CO2 25 24 23   BUN 34* 20 24*  CREATININE 4.57* 3.27* 3.82*  GLUCOSE 112* 93 114*    Electrolytes Recent Labs  Lab 04/21/17 0521 04/21/17 0522 04/21/17 1851 04/22/17 0323  CALCIUM 8.9  --  8.5* 8.8*  MG  --  2.2 1.9 2.1  PHOS 5.3* 5.3* 3.7 5.0*    CBC Recent Labs  Lab 04/20/17 0432 04/21/17 0522 04/22/17 0323  WBC 9.3 10.2 10.4  HGB 7.0* 7.1* 7.7*  HCT 22.3* 23.3* 24.5*  PLT 205 221 245    Coag's Recent Labs  Lab 04/15/17 1356 04/18/17 0635  APTT 39*  --   INR 1.14 1.31    Sepsis Markers Recent Labs  Lab 04/18/17 0606 04/18/17 1610 04/20/17 1500 04/21/17 0522 04/22/17 0323  LATICACIDVEN 9.2* 1.0  --   --   --   PROCALCITON  --   --  26.08 22.87 18.61    ABG Recent Labs  Lab 04/18/17 0624 04/18/17 0925 04/22/17 0352  PHART 7.207* 7.486* 7.514*  PCO2ART 50.1* 33.6 28.6*  PO2ART 37.0* 268* 69.0*    Liver Enzymes Recent Labs  Lab 04/15/17 1356  04/18/17 0635  04/21/17 0521 04/21/17 1851 04/22/17 0323  AST 20  --  290*  --   --   --  54*  ALT 15  --  140*  --   --   --  94*  ALKPHOS 449*  --  281*  --   --   --  218*  BILITOT 0.7  --  1.5*  --   --   --  1.8*  ALBUMIN 2.8*   < > 2.2*   < > 2.2* 2.1* 2.2*   < > = values in this interval not displayed.    Cardiac Enzymes Recent Labs  Lab 04/18/17 0635  TROPONINI 0.67*    Glucose Recent Labs  Lab 04/21/17 1136 04/21/17 1650 04/21/17 1956 04/21/17 2321 04/22/17 0423 04/22/17 0832  GLUCAP 113* 83 80 99 115* 121*    Imaging Ct Chest Wo Contrast  Result Date: 04/21/2017 CLINICAL DATA:  Follow-up multiple rib fractures. EXAM: CT CHEST WITHOUT CONTRAST TECHNIQUE: Multidetector CT imaging of the chest was performed following the standard protocol without IV contrast.  COMPARISON:  Radiographs earlier this day.  Chest CT 04/18/2017 FINDINGS: Cardiovascular: Aortic atherosclerosis. Dilatation of main pulmonary artery measuring 4 cm. Coronary artery calcifications or stents. Multi chamber cardiomegaly. Minimal pericardial fluid anteriorly. Mediastinum/Nodes: Increased size and number of multiple mediastinal nodes, likely reactive given appearance over 3 days. Limited assessment for hilar adenopathy given lack contrast. Endotracheal and enteric tubes in place. Lungs/Pleura: Bilateral lower lobe airspace consolidations, progressed from prior exam. Minimal smooth septal thickening at the apices with scattered ground-glass opacity. No pneumothorax. Small bilateral pleural effusions. Upper Abdomen: Upper abdominal ascites, similar to prior CT. Advanced atherosclerotic calcifications of upper abdominal vasculature. Musculoskeletal: Right anterior rib fractures 2 through 5, with second, third, and fifth fractures being mildly displaced. Left anterior third, fourth, and fifth rib fractures, with third fracture being mildly comminuted and displaced. No segmental rib fracture. Nondisplaced mid sternal fracture. IMPRESSION: 1. Acute anterior right second through fifth and anterior left third through fifth rib fractures, many of which are displaced. 2. Nondisplaced mid sternal fracture. 3. Bilateral lower lobe airspace consolidations, atelectasis versus aspiration. Small pleural effusions. Suspect trace pulmonary edema. 4. Aortic atherosclerosis with coronary artery disease. Aortic Atherosclerosis (ICD10-I70.0). Electronically Signed   By: Jeb Levering M.D.   On: 04/21/2017 23:09   Dg Chest Port 1 View  Result Date: 04/22/2017 CLINICAL DATA:  Endotracheal tube EXAM: PORTABLE CHEST 1 VIEW COMPARISON:  04/21/2017 FINDINGS: Endotracheal tube 6.5 mm above the carina unchanged. Gastric tube in the stomach Progression of diffuse bilateral airspace disease, suggestive of edema. Bibasilar  atelectasis mildly improved. No effusion IMPRESSION: Progression of bilateral airspace disease suggestive of pulmonary edema Mild improvement in bibasilar atelectasis Endotracheal tube high, 6.5 cm above the carina Electronically Signed   By: Franchot Gallo M.D.   On: 04/22/2017 08:36    STUDIES:  Abdominal/chest CT 11/21>>> no evidence of pulmonary embolism.  Nondiagnostic CT of the abdomen and pelvis.  Probable hematoma in the right superficial femoral Artery.  Moderate to large volume this ascites Head CT 11/21>>> not done KUB 11/25 no evidence ileus or obstruction.  CT chest 11/26 >  Acute anterior right second through fifth and anterior left third through fifth rib fractures, many of which are displaced. Nondisplaced mid sternal fracture. Bilateral lower lobe airspace consolidations, atelectasis versus aspiration. Small pleural effusions. Suspect trace pulmonary edema. Aortic atherosclerosis with coronary artery disease.  CULTURES: Blood 11/21>>> Blood culture 04/19/2017>> Urine culture 04/20/2017 done via in and out cath with 30 cc obtained>> Sputum culture 04/20/2017>>  ANTIBIOTICS: Levaquin 11/21>>> Flagyl 11/21>>> Vancomycin 04/20/2017 >>  SIGNIFICANT EVENTS:  11/21>>> Cardiac arrest in OR 04/17/2017  extubated 04/18/2017>> second cardiac arrest requiring intubation and 30 minutes of CPR 04/20/2017 in and out cath with 30 cc obtained 04/20/2017 no extubation at this time secondary to chest wall pain  LINES/TUBES: ETT 11/21>>>11/22 reintubated 11/23 for cardiac arrest>> L femoral HD catheter 11/21>>> R radial a-line 11/21>>> 11/26  DISCUSSION: 41 year old type I diabetic with all the classic complications with cardiac arrest in the OR for 2 hours that is now following commands so no need for cooling.  Rapidly coming off pressors with addressing of acidosis.  Cause of arrest suspected to be acidosis after reperfusion in a patient with ESRD vs ischemic bowel.  Slow to wean due  to chest pain but wakes up and is neuro intact.   ASSESSMENT / PLAN:  PULMONARY A: ARDS and VDRF post CPR for 2 hours > improved Difficulty weaning due to chest wall pain Mild pulmonary edema Multiple rib Fx many of which displaced P:   Full vent support Follow CXR Is weanable, but limited due to chest wall pain. Will have to continue to evaluate this over the days to come.  VAP bundle Curbside CVTS feel little benefit for rib plating at this point as she will likely require trach regardless. Will plan for tracheostomy today.  CARDIOVASCULAR A:  Circulatory Shock, unknown cause, resolving with resolution of acidosis (resolved) HTN Arrest x2 P:  Continue clevidipine infusion for SBP goal 160-170 EKG monitoring Cardiology following  RENAL A:   ESRD Metabolic acidosis (resolved) P:   Nephrology following for HD (typically MWF) (now off CRRT) BMP daily  GASTROINTESTINAL A:   Nutrition Ileus? Transaminitis - Abdominal CT is done 04/18/2018 which demonstrated probable hematoma in the right superficial femoral artery.  Also notes a large to moderate volume of ascites. KUB 11/25 with no evidence ileus or obstruction. P:   Holding TF due to nausea and large residuals. Bowel regimen today to promote BM PPI  HEMATOLOGIC A:   No active issues P:  CBC Transfuse per ICU protocol Subq heparin hold this AM for trach  INFECTIOUS A:   Concern for ischemic bowel or intraabdominal infection lactic acid was .95 Continued fevers  Occult sepsis concern bacteremia, intraabdominal  -11/21 blood cultures x2> neg -04/19/2017 blood cultures x2>> -11/25 sputum>>  p:   Levaquin 11/21 > Flagyl 11/21 > Vancomycin added 11/25 (continued fevers) >> Follow cultures Follow PCT (26>23) Limit tylenol with bump in AST/ALT.   ENDOCRINE A:   DM   P:   SSI CBG  NEUROLOGIC A:   Acute encephalopathy (improved)  P:   RASS goal: 0 to -1 Fentanyl infusion Precedex infusion Will  add propofol to start for trach procedure.  Pain control as needed.  Her sternum is extremely tender from previous CPR x2  FAMILY  - Updates: No family available  - Inter-disciplinary family meet or Palliative Care meeting due by:  day Aubrey, AGACNP-BC [facility] Pulmonology/Critical Care Pager [phone] or [phone]  04/22/2017 8:58 AM  Attending Note:  41 year old female with VDRF post prolonged cardiac arrest.  Mucous plugging overnight required deep suction.  On exam, coarse BS diffusely.  I reviewed chest CT myself, multiple rib fractures noted.  I discussed case with CVTS, given the fact that the fractures are not displaced, there is no role for surgical platting of ribs.  Continues to fail weaning due to flail chest.  Spoke with daughter, discussed case.  Will proceed with tracheostomy today as I do not anticipate patient will  be extubatable with a flail chest.  Will trach today.  The patient is critically ill with multiple organ systems failure and requires high complexity decision making for assessment and support, frequent evaluation and titration of therapies, application of advanced monitoring technologies and extensive interpretation of multiple databases.   Critical Care Time devoted to patient care services described in this note is  35  Minutes. This time reflects time of care of this signee Dr Jennet Maduro. This critical care time does not reflect procedure time, or teaching time or supervisory time of PA/NP/Med student/Med Resident etc but could involve care discussion time.  Rush Farmer, M.D. [facility] Pulmonary/Critical Care Medicine. Pager: [phone]. After hours pager: [phone].

## 2017-04-22 NOTE — Progress Notes (Addendum)
[facility] and intubated.  Follows commands.   Objective (!) 155/77 77 99.1 F (37.3 C) (Rectal) 18 100%  Intake/Output Summary (Last 24 hours) at 04/22/2017 0825 Last data filed at 04/22/2017 0800 Gross per 24 hour  Intake 2081.72 ml  Output 4665 ml  Net -2583.28 ml    Stage 2 pressure ulcer sacral area Doppler PT/DP signals B Moves all extremities Heart RRR Lungs intubated supported with precedex  CT chest IMPRESSION: 1. Acute anterior right second through fifth and anterior left third through fifth rib fractures, many of which are displaced. 2. Nondisplaced mid sternal fracture. 3. Bilateral lower lobe airspace consolidations, atelectasis versus aspiration. Small pleural effusions. Suspect trace pulmonary edema. 4. Aortic atherosclerosis with coronary artery disease.    Assessment/Planning: 41 y.o. female is s/p R CFA endarterectomy with cardiac arrest intra-operatively 6 Days Post-Op   Fentanyl for pain management Precedex for agitation Clevidipine, clonidine, and amlodipine for HTN Max BP 207/75  4L off with HD yesterday SSI q4    No BM for 6 days will give Dulcolax suppository   Roxy Horseman 04/22/2017 8:25 AM --  Laboratory Lab Results: Recent Labs    04/21/17 0522 04/22/17 0323  WBC 10.2 10.4  HGB 7.1* 7.7*  HCT 23.3* 24.5*  PLT 221 245   BMET Recent Labs    04/21/17 1851 04/22/17 0323  NA 135 137  K 3.0* 3.3*  CL 95* 96*  CO2 24 23  GLUCOSE 93 114*  BUN 20 24*  CREATININE 3.27* 3.82*  CALCIUM 8.5* 8.8*    COAG Lab Results  Component Value Date   INR 1.31 04/18/2017   INR 1.14 04/15/2017   INR 1.13 03/27/2017   No results found for: PTT  Trach today, now resting comfortably JP still with large volume output, will leave in for now Still needs toes addressed by Dt. Sharol Given once medically stable  Wells Garner Dullea

## 2017-04-22 NOTE — Procedures (Signed)
Bronchoscopy  for Percutaneous  Tracheostomy  Name: [name] MRN: [id number] DOB: 09/08/1975 Procedure: Bronchoscopy for Percutaneous Tracheostomy Indications: Diagnostic evaluation of the airways In conjunction with: Dr. Titus Mould   Procedure Details Consent: Risks of procedure as well as the alternatives and risks of each were explained to the (patient/caregiver).  Consent for procedure obtained. Time Out: Verified patient identification, verified procedure, site/side was marked, verified correct patient position, special equipment/implants available, medications/allergies/relevent history reviewed, required imaging and test results available.  Performed  In preparation for procedure, patient was given 100% FiO2 and bronchoscope lubricated. Sedation: Propofol infusion Fentanyl 252mcg Versed 4mg  Etomidate 20mg  Vecuronium 10mg   Airway entered and the following bronchi were examined: Trachea Procedures performed: Endotracheal Tube retracted in 2 cm increments. Cannulation of airway observed. Dilation observed. Placement of trachel tube observed. No damage to posterior wall of trachea observed.  No overt complications. Bronchoscope removed and placed into tracheostomy tube to confirm placement.   Evaluation Hemodynamic Status: BP stable throughout; O2 sats: stable throughout Patient's Current Condition: stable Specimens:   None Complications: No apparent complications Patient did tolerate procedure well.   Georgann Housekeeper, AGACNP-BC [facility] Pulmonology/Critical Care Pager [phone] or [phone]  04/22/2017 11:52 AM

## 2017-04-22 NOTE — Progress Notes (Signed)
[facility] Progress Note  Subjective: wts down 91kg now  Vitals:   04/22/17 1400 04/22/17 1415 04/22/17 1430 04/22/17 1445  BP: (!) 148/67 (!) 145/71 140/71 (!) 145/65  Pulse: 80 80 76 78  Resp: (!) 24 (!) 24 (!) 24 (!) 24  Temp:      TempSrc:      SpO2: 100% 99% 100% 100%  Weight:      Height:        Inpatient medications: . amLODipine  10 mg Oral Daily  . chlorhexidine gluconate (MEDLINE KIT)  15 mL Mouth Rinse BID  . Chlorhexidine Gluconate Cloth  6 each Topical Daily  . cloNIDine  0.3 mg Per NG tube TID  . [START ON 04/23/2017] darbepoetin (ARANESP) injection - DIALYSIS  200 mcg Intravenous Q Wed-HD  . docusate  100 mg Per Tube BID  . feeding supplement (PRO-STAT SUGAR FREE 64)  60 mL Per Tube BID  . fentaNYL (SUBLIMAZE) injection  200 mcg Intravenous Once  . heparin  5,000 Units Subcutaneous Q8H  . insulin aspart  2-6 Units Subcutaneous Q4H  . mouth rinse  15 mL Mouth Rinse QID  . pantoprazole sodium  40 mg Per Tube Daily  . sennosides  5 mL Per Tube BID  . sodium chloride flush  10-40 mL Intracatheter Q12H  . spironolactone  100 mg Per NG tube Daily   . sodium chloride    . sodium chloride    . clevidipine 21 mg/hr (04/22/17 1404)  . dexmedetomidine (PRECEDEX) IV infusion 0.8 mcg/kg/hr (04/22/17 0800)  . fentaNYL infusion INTRAVENOUS 30 mcg/hr (04/22/17 1406)  . levofloxacin (LEVAQUIN) IV Stopped (04/20/17 1745)  . metronidazole 500 mg (04/22/17 1028)  . propofol    . sodium chloride    . vancomycin     sodium chloride, sodium chloride, acetaminophen (TYLENOL) oral liquid 160 mg/5 mL, alteplase, bisacodyl, fentaNYL (SUBLIMAZE) injection, heparin, heparin, hydrALAZINE, ipratropium-albuterol, labetalol, lidocaine (PF), lidocaine-prilocaine, midazolam, ondansetron (ZOFRAN) IV, pentafluoroprop-tetrafluoroeth, sodium chloride, sodium chloride flush  Exam: On vent, follows simple commands, eyes closed Trach in place Chest coarse BS RRR no mrg Abd  distended, nontender, +BS RUE 2-3+ edema, 1+ LE edema L groin trialysis cath/ LUE AVF+bruit  Dialysis:  MWF  4h 56mn   89kg (not getting there)   Hep 2000  LFA AVF -hect 7 ug -mircera 225 due 11/28 -last Hb 9.3, Ca 8.7/ P 9, pth 228 -sensipar 60/ auryxia  -home BP Rx : norvasc 10/ clon 0.3 tid/ hydralazine 100 tid/ labetalol 300 tid/ aldomet 500 qd      Impression: 1. SP cardiac arrest - 11/21, CPR x 2 hrs. 2nd arrest on 11/23 2. VDRF -  CXR improving, vasc congestion still 3. SP R fem artery endarterectomy  - 11/21 by VVS 4. HTN - resumed norvasc/ clonidine here, let BP's run on high side per CCM, on mult BP meds at home 5. ESRD - sp CRRT, back on HD MWF 6. Anemia of CKD - Hb 7-8 range. Ordered darbe max dose to get on 11/28. Transfuse prn.  7. Volume - +2kg today 8. DM - per primary   Plan - HD Wed, cont to lower volume   RKelly SplinterMD CNorthwestpager [phone]  04/22/2017, 3:52 PM   Recent Labs  Lab 04/21/17 0521 04/21/17 0522 04/21/17 1851 04/22/17 0323  NA 138  --  135 137  K 4.0  --  3.0* 3.3*  CL 98*  --  95* 96*  CO2  25  --  24 23  GLUCOSE 112*  --  93 114*  BUN 34*  --  20 24*  CREATININE 4.57*  --  3.27* 3.82*  CALCIUM 8.9  --  8.5* 8.8*  PHOS 5.3* 5.3* 3.7 5.0*   Recent Labs  Lab 04/18/17 0635  04/21/17 0521 04/21/17 1851 04/22/17 0323  AST 290*  --   --   --  54*  ALT 140*  --   --   --  94*  ALKPHOS 281*  --   --   --  218*  BILITOT 1.5*  --   --   --  1.8*  PROT 5.8*  --   --   --  6.3*  ALBUMIN 2.2*   < > 2.2* 2.1* 2.2*   < > = values in this interval not displayed.   Recent Labs  Lab 04/20/17 0432 04/21/17 0522 04/22/17 0323  WBC 9.3 10.2 10.4  NEUTROABS 7.3 7.8* 7.9*  HGB 7.0* 7.1* 7.7*  HCT 22.3* 23.3* 24.5*  MCV 91.4 91.7 92.5  PLT 205 221 245   Iron/TIBC/Ferritin/ %Sat    Component Value Date/Time   IRON 124 10/31/2014 0405   TIBC 157 (L) 10/31/2014 0405   FERRITIN 780 (H) 10/31/2014 0405    IRONPCTSAT 79 (H) 10/31/2014 0405

## 2017-04-22 NOTE — Progress Notes (Addendum)
Pt husband rushed out of room around 0650 stating something was wrong with the pt. Another RN was around the corner and rushed into the room, found pt grey, in distress with O2 sats in the 40s. RN called a code blue and RT immediately ran into the room and began bagging the patient. Pt never lost a pulse and color quickly returned to face after bagging began, code blue cancelled. Per RT, no difficulty bagging. After a minute, sats recovered, pt placed back on vent at 50% (per RT), sats 99-100%. Pt appearing calm and no longer in distress. RT lavaged pt and suctioned a few small brown plugs. RN spoke with Dr. Ashok Cordia who camered in and made him aware of events. Xray tech has already been by to get images and CT chest was done last night, RN made MD aware. Pt now resting quietly, husband at bedside, in no distress. Report given to oncoming shift.

## 2017-04-22 NOTE — Progress Notes (Signed)
Nutrition Follow-up  DOCUMENTATION CODES:   Non-severe (moderate) malnutrition in context of chronic illness  INTERVENTION:   -Recommend insertion of CORTRAK tube (service available tomorrow 04/23/17) with initiation of trophic feedings (10 ml/hr) with plans to titrate TF as tolerated. CORTRAK can be placed post-pyloric position   -With current Cleviprex and diprivan rate, recommend Vital High Protein at 10 ml/hr with addition of Pro-Stat 60 mL (2 packets) 5 times daily  -Consider addition of reglan or erythromycin to promote gut motility  NUTRITION DIAGNOSIS:   Moderate Malnutrition related to chronic illness(ESRD and type 1 DM) as evidenced by mild fat depletion, moderate fat depletion, mild muscle depletion, moderate muscle depletion.  Continues  GOAL:   Patient will meet greater than or equal to 90% of their needs  Not Met  MONITOR:   Vent status, I & O's  REASON FOR ASSESSMENT:   Ventilator    ASSESSMENT:   Pt with PMH of type 1 DM, ESRD on HD, PVD admitted for elective management of PAD. While in OR for femoral endarterectomy she developed severe hypotension and metabolic acidosis, cardiac arrest with resuscitative efforts for 2 hrs. Pt transferred to ICU for CRRT, extubated. On 11/23 at 0600 pt with second PEA arrest requiring CPR for 30 min and re-intubated.   Pt remains on vent support Trach and bronch performed today  OG tube removed with trach placement, no NG at present. Noted 1450 mL output in previous 24 hours via OG tube  Pt continues without BM, per Shirlean Mylar RN, plan to check for impaction today and give suppository  Patient is currently intubated on ventilator support MV: 16.1 L/min Temp (24hrs), Avg:98.7 F (37.1 C), Min:98 F (36.7 C), Max:99.1 F (37.3 C)  Propofol: started today, 27.3 ml/hr Cleviprex: 42 ml/hr  Labs: no BMP today, TG 299 Meds: reviewed  Diet Order:  Diet NPO time specified Diet NPO time specified  EDUCATION NEEDS:    Not appropriate for education at this time  Skin:  Skin Assessment: Skin Integrity Issues: Skin Integrity Issues:: (Cellulitis: BLE, open scaral wound, degloving R toe, DM wound L toe)  Last BM:  11/20  Height:   Ht Readings from Last 1 Encounters:  04/21/17 '6\' 4"'  (1.93 m)    Weight:   Wt Readings from Last 1 Encounters:  04/22/17 200 lb 9.9 oz (91 kg)    Ideal Body Weight:  81.8 kg  BMI:  Body mass index is 24.42 kg/m.  Estimated Nutritional Needs:   Kcal:  2359 kcals  Protein:  134-178 g (max 223 g per day)  Fluid:  1000 mL plus UOP  B[employer]ner MS, RD, LDN, CNSC [phone] Pager  [phone] Weekend/On-Call Pager

## 2017-04-22 NOTE — Consult Note (Addendum)
[place] Nurse wound consult note Reason for Consult: Consult requested for coccyx and left buttock Wound type: Pt spent an extended period of time in the OR and coded on 2023-05-07.  It appears that she developed a deep tissue injury; which has evolved into a stage 2 pressure injury at this time.   Coccyx with darker-colored skin to affected area, approx 4X2cm, with stage 2 pressure injury in the center, 2X2X.1cm, red and moist, small amt pink drainage, no odor. Left buttock with partial thickness injury; appears related to shear; 1.5X1.5X.1cm, pink moist wound bed, loose peeling skin surrounding, small amt pink drainage, no odor. Pressure Injury POA: No Dressing procedure/placement/frequency: Pt is on a low air loss bed to reduce pressure.  Foam dressing to protect and promote healing.  She remains critically ill and on the vent.  Discussed plan of care with daughter at the bedside. Please re-consult if further assistance is needed.  Thank-you,  Julien Girt MSN, [place], [place], [place], [place]

## 2017-04-23 ENCOUNTER — Inpatient Hospital Stay (HOSPITAL_COMMUNITY): Payer: Medicare Other

## 2017-04-23 ENCOUNTER — Ambulatory Visit: Payer: Medicare Other | Admitting: Surgery

## 2017-04-23 DIAGNOSIS — I739 Peripheral vascular disease, unspecified: Secondary | ICD-10-CM

## 2017-04-23 DIAGNOSIS — Z930 Tracheostomy status: Secondary | ICD-10-CM

## 2017-04-23 LAB — CBC WITH DIFFERENTIAL/PLATELET
Basophils Absolute: 0 10*3/uL (ref 0.0–0.1)
Basophils Relative: 0 %
EOS ABS: 0.2 10*3/uL (ref 0.0–0.7)
EOS PCT: 2 %
HCT: 23.2 % — ABNORMAL LOW (ref 36.0–46.0)
Hemoglobin: 7.3 g/dL — ABNORMAL LOW (ref 12.0–15.0)
LYMPHS ABS: 1 10*3/uL (ref 0.7–4.0)
Lymphocytes Relative: 12 %
MCH: 28.7 pg (ref 26.0–34.0)
MCHC: 31.5 g/dL (ref 30.0–36.0)
MCV: 91.3 fL (ref 78.0–100.0)
MONOS PCT: 8 %
Monocytes Absolute: 0.7 10*3/uL (ref 0.1–1.0)
Neutro Abs: 6.4 10*3/uL (ref 1.7–7.7)
Neutrophils Relative %: 78 %
PLATELETS: 250 10*3/uL (ref 150–400)
RBC: 2.54 MIL/uL — ABNORMAL LOW (ref 3.87–5.11)
RDW: 18 % — AB (ref 11.5–15.5)
WBC: 8.3 10*3/uL (ref 4.0–10.5)

## 2017-04-23 LAB — RENAL FUNCTION PANEL
ANION GAP: 17 — AB (ref 5–15)
Albumin: 1.9 g/dL — ABNORMAL LOW (ref 3.5–5.0)
BUN: 32 mg/dL — ABNORMAL HIGH (ref 6–20)
CALCIUM: 8.5 mg/dL — AB (ref 8.9–10.3)
CHLORIDE: 96 mmol/L — AB (ref 101–111)
CO2: 21 mmol/L — ABNORMAL LOW (ref 22–32)
CREATININE: 4.81 mg/dL — AB (ref 0.44–1.00)
GFR, EST AFRICAN AMERICAN: 12 mL/min — AB (ref >60)
GFR, EST NON AFRICAN AMERICAN: 10 mL/min — AB (ref >60)
Glucose, Bld: 122 mg/dL — ABNORMAL HIGH (ref 65–99)
Phosphorus: 5.7 mg/dL — ABNORMAL HIGH (ref 2.5–4.6)
Potassium: 3.3 mmol/L — ABNORMAL LOW (ref 3.5–5.1)
SODIUM: 134 mmol/L — AB (ref 135–145)

## 2017-04-23 LAB — GLUCOSE, CAPILLARY
GLUCOSE-CAPILLARY: 125 mg/dL — AB (ref 65–99)
GLUCOSE-CAPILLARY: 122 mg/dL — AB (ref 65–99)
Glucose-Capillary: 104 mg/dL — ABNORMAL HIGH (ref 65–99)
Glucose-Capillary: 149 mg/dL — ABNORMAL HIGH (ref 65–99)
Glucose-Capillary: 59 mg/dL — ABNORMAL LOW (ref 65–99)
Glucose-Capillary: 97 mg/dL (ref 65–99)
Glucose-Capillary: 97 mg/dL (ref 65–99)

## 2017-04-23 LAB — MAGNESIUM
MAGNESIUM: 2 mg/dL (ref 1.7–2.4)
Magnesium: 2 mg/dL (ref 1.7–2.4)

## 2017-04-23 MED ORDER — PRO-STAT SUGAR FREE PO LIQD
60.0000 mL | Freq: Every day | ORAL | Status: DC
  Administered 2017-04-23 – 2017-04-25 (×9): 60 mL
  Filled 2017-04-23 (×9): qty 60

## 2017-04-23 MED ORDER — DARBEPOETIN ALFA 200 MCG/0.4ML IJ SOSY
PREFILLED_SYRINGE | INTRAMUSCULAR | Status: AC
  Administered 2017-04-23: 200 ug via INTRAVENOUS
  Filled 2017-04-23: qty 0.4

## 2017-04-23 MED ORDER — PENTAFLUOROPROP-TETRAFLUOROETH EX AERO: 1.0000 "application " | INHALATION_SPRAY | CUTANEOUS | Status: DC | PRN

## 2017-04-23 MED ORDER — METOPROLOL TARTRATE 5 MG/5ML IV SOLN: 5.0000 mg | Freq: Once | INTRAVENOUS | Status: DC

## 2017-04-23 MED ORDER — HEPARIN SODIUM (PORCINE) 1000 UNIT/ML DIALYSIS
2000.0000 [IU] | Freq: Once | INTRAMUSCULAR | Status: AC
  Administered 2017-04-23: 2000 [IU] via INTRAVENOUS_CENTRAL

## 2017-04-23 MED ORDER — VITAL HIGH PROTEIN PO LIQD
1000.0000 mL | ORAL | Status: DC
  Administered 2017-04-23 – 2017-04-24 (×2): 1000 mL

## 2017-04-23 MED ORDER — LIDOCAINE-PRILOCAINE 2.5-2.5 % EX CREA
1.0000 "application " | TOPICAL_CREAM | CUTANEOUS | Status: DC | PRN
  Filled 2017-04-23: qty 5

## 2017-04-23 MED ORDER — SODIUM CHLORIDE 0.9 % IV SOLN: 100.0000 mL | INTRAVENOUS | Status: DC | PRN

## 2017-04-23 MED ORDER — DEXTROSE 50 % IV SOLN
INTRAVENOUS | Status: AC
  Administered 2017-04-23: 50 mL
  Filled 2017-04-23: qty 50

## 2017-04-23 MED ORDER — DEXTROSE 50 % IV SOLN
25.0000 mL | Freq: Once | INTRAVENOUS | Status: AC
  Administered 2017-04-23: 25 mL via INTRAVENOUS

## 2017-04-23 MED ORDER — HEPARIN SODIUM (PORCINE) 1000 UNIT/ML DIALYSIS: 1000.0000 [IU] | INTRAMUSCULAR | Status: DC | PRN

## 2017-04-23 MED ORDER — LIDOCAINE HCL (PF) 1 % IJ SOLN: 5.0000 mL | INTRAMUSCULAR | Status: DC | PRN

## 2017-04-23 MED ORDER — VANCOMYCIN HCL IN DEXTROSE 1-5 GM/200ML-% IV SOLN
INTRAVENOUS | Status: AC
  Administered 2017-04-23: 1000 mg via INTRAVENOUS
  Filled 2017-04-23: qty 200

## 2017-04-23 MED ORDER — PROPOFOL 1000 MG/100ML IV EMUL
5.0000 ug/kg/min | INTRAVENOUS | Status: AC
  Administered 2017-04-23 – 2017-04-24 (×4): 50 ug/kg/min via INTRAVENOUS
  Administered 2017-04-24 (×2): 25 ug/kg/min via INTRAVENOUS
  Filled 2017-04-23 (×2): qty 100
  Filled 2017-04-23: qty 200
  Filled 2017-04-23: qty 100

## 2017-04-23 MED ORDER — ALTEPLASE 2 MG IJ SOLR
2.0000 mg | Freq: Once | INTRAMUSCULAR | Status: DC | PRN
Start: 2017-04-23 — End: 2017-04-24

## 2017-04-23 NOTE — Progress Notes (Signed)
Cortrak Tube Team Note:  Consult received to place a Cortrak feeding tube.   A 10 F Cortrak tube was placed in the right nare and secured with a nasal bridle at 100 cm. Per the Cortrak monitor reading the tube tip is in the plyloric region.   X-ray is required, abdominal x-ray has been ordered by the Cortrak team. Please confirm tube placement before using the Cortrak tube.   If the tube becomes dislodged please keep the tube and contact the Cortrak team at [email or web address] (password [email or web address]) for replacement.  If after hours and replacement cannot be delayed, place a NG tube and confirm placement with an abdominal x-ray.    Koleen Distance MS, RD, LDN Pager #- [phone] After Hours Pager: [phone]

## 2017-04-23 NOTE — Progress Notes (Signed)
Initial Nutrition Assessment  DOCUMENTATION CODES:   Non-severe (moderate) malnutrition in context of chronic illness  INTERVENTION:   -Recommend post-pyloric Cortrak insertion with initiation of nutrition support. Received verbal order from MD Yacoub for Cortrak placement  Tube Feeding:  -Vital High Protein @ 10 ml/hr with Pro-Stat 60 mL 5 times (10 packets total per day) daily providing 171 g of protein to meet protein needs. Pt currently receiving significant calories via diprivan and cleviprex  -If N/V persists, recommend trial of pro-motility agent such as reglan or erythromycin as pt with likely delayed gastric emptying in setting of acute illness and hx of gastroparesis with Type I DM   NUTRITION DIAGNOSIS:   Moderate Malnutrition related to chronic illness(ESRD and type 1 DM) as evidenced by mild fat depletion, moderate fat depletion, mild muscle depletion, moderate muscle depletion.  Being addressed as placing Cortrak and starting nutrition support  GOAL:   Patient will meet greater than or equal to 90% of their needs  Not met but addressed  MONITOR:   Vent status, I & O's  REASON FOR ASSESSMENT:   Ventilator    ASSESSMENT:   Pt with PMH of type 1 DM, ESRD on HD, PVD admitted for elective management of PAD. While in OR for femoral endarterectomy she developed severe hypotension and metabolic acidosis, cardiac arrest with resuscitative efforts for 2 hrs. Pt transferred to ICU for CRRT, extubated. On 11/23 at 0600 pt with second PEA arrest requiring CPR for 30 min and re-intubated.   Patient is currently on ventilator support via trach MV: 16.2 L/min Temp (24hrs), Avg:99.3 F (37.4 C), Min:99.1 F (37.3 C), Max:99.5 F (37.5 C)  Propofol: 28.3 ml/hr (747 kcals) Cleviprex: 34 ml/hr (1632 kcals in 24 hours)  Trach placed 11/28 Plan for HD today (receives HD MWF) NG tube placed yesterday, located in stomach. Pt with episode of emesis last night and NG  inserted and placed to suction with 550 mL bilious return +BM this AM (11/28) Hypoactive BS, abdomen remains distended (again noted moderate to large amount of acsites on previous CT)   Labs: sodium 134, potassium 3.3, phosphorus 5.7, corrected calcium 10.2 (albumin 1.9), CBGs acceptable Meds: reviewed  Diet Order:  Diet NPO time specified  EDUCATION NEEDS:   Not appropriate for education at this time  Skin:  Skin Assessment: Skin Integrity Issues: Skin Integrity Issues:: Stage II, Other (Comment) Stage II: coccyx (DTI that evolved into stage II) Other: L buttock partial thickness wound, degloving R toe, DM ulcer L toe  Last BM:  11/28  Height:   Ht Readings from Last 1 Encounters:  04/21/17 6' 4" (1.93 m)    Weight:   Wt Readings from Last 1 Encounters:  04/23/17 201 lb 11.5 oz (91.5 kg)    Ideal Body Weight:  81.8 kg  BMI:  Body mass index is 24.55 kg/m.  Estimated Nutritional Needs:   Kcal:  2359 kcals  Protein:  134-178 g (max 223 g per day)  Fluid:  1000 mL plus UOP  Cate  MS, RD, LDN, CNSC [phone] Pager  [phone] Weekend/On-Call Pager  

## 2017-04-23 NOTE — Progress Notes (Signed)
Hypoglycemic Event  CBG: 59 at 1124  Treatment: 25 mL D50  Symptoms: none  Follow-up CBG: Time:1155 CBG Result:122  Possible Reasons for Event:Tube feeds stopped   Comments: Will continue to monitor patient closely.    Vonna Drafts RN

## 2017-04-23 NOTE — Progress Notes (Signed)
Cortrak Tube Team Note:  Consult received to place a Cortrak feeding tube.   A 10 F Cortrak tube was placed in the left nare and secured with a nasal bridle at 100+ cm. Per the Cortrak monitor reading the tube tip is post pyloric.    X-ray is required, abdominal x-ray has been ordered by the Cortrak team. Please confirm tube placement before using the Cortrak tube.   If the tube becomes dislodged please keep the tube and contact the Cortrak team at [email or web address] (password [email or web address]) for replacement.  If after hours and replacement cannot be delayed, place a NG tube and confirm placement with an abdominal x-ray.    Koleen Distance MS, RD, LDN Pager #- [phone] After Hours Pager: [phone]

## 2017-04-23 NOTE — Progress Notes (Signed)
Per Dr. Jonnie Finner (Nephrology), left femoral HD catheter to be removed.   Donnelly Angelica RN

## 2017-04-23 NOTE — Progress Notes (Signed)
[facility] Progress Note  Subjective: no new problems  Vitals:   04/23/17 0851 04/23/17 0900 04/23/17 0915 04/23/17 0930  BP: (!) 150/93 (!) 162/68 (!) 159/70 (!) 160/68  Pulse: 86 80 80 80  Resp: (!) 24 (!) 24 (!) 24 (!) 24  Temp:      TempSrc:      SpO2: 92% 100% 100% 99%  Weight:      Height:        Inpatient medications: . amLODipine  10 mg Oral Daily  . chlorhexidine gluconate (MEDLINE KIT)  15 mL Mouth Rinse BID  . Chlorhexidine Gluconate Cloth  6 each Topical Daily  . cloNIDine  0.3 mg Per NG tube TID  . darbepoetin (ARANESP) injection - DIALYSIS  200 mcg Intravenous Q Wed-HD  . docusate  100 mg Per Tube BID  . feeding supplement (PRO-STAT SUGAR FREE 64)  60 mL Per Tube BID  . fentaNYL (SUBLIMAZE) injection  200 mcg Intravenous Once  . heparin  5,000 Units Subcutaneous Q8H  . insulin aspart  2-6 Units Subcutaneous Q4H  . mouth rinse  15 mL Mouth Rinse QID  . pantoprazole sodium  40 mg Per Tube Daily  . sennosides  5 mL Per Tube BID  . sodium chloride flush  10-40 mL Intracatheter Q12H  . spironolactone  100 mg Per NG tube Daily   . sodium chloride    . clevidipine 17 mg/hr (04/23/17 0906)  . fentaNYL infusion INTRAVENOUS 350 mcg/hr (04/23/17 0900)  . levofloxacin (LEVAQUIN) IV 500 mg (04/22/17 1737)  . metronidazole Stopped (04/23/17 2094)  . propofol 50 mcg/kg/min (04/23/17 0900)  . sodium chloride    . vancomycin     sodium chloride, acetaminophen (TYLENOL) oral liquid 160 mg/5 mL, bisacodyl, fentaNYL (SUBLIMAZE) injection, hydrALAZINE, ipratropium-albuterol, labetalol, midazolam, ondansetron (ZOFRAN) IV, sodium chloride, sodium chloride flush  Exam: On vent, follows simple commands, eyes closed Trach in place Chest coarse BS RRR no mrg Abd distended, nontender, +BS RUE 2+ edema, 1+ LE edema L groin trialysis cath/ LUE AVF+bruit  Dialysis:  MWF  4h 40mn   89kg (not getting there)   Hep 2000  LFA AVF -hect 7 ug -mircera 225 due  11/28 -last Hb 9.3, Ca 8.7/ P 9, pth 228 -sensipar 60/ auryxia  -home BP Rx : norvasc 10/ clon 0.3 tid/ hydralazine 100 tid/ labetalol 300 tid/ aldomet 500 qd      Impression: 1. SP cardiac arrest - 11/21, CPR x 2 hrs. 2nd arrest on 11/23 2. VDRF -  CXR improving, vasc congestion still 3. SP R fem artery endarterectomy  - 11/21 by VVS 4. HTN - resumed norvasc/ clonidine here, let BP's run on high side per CCM, on mult BP meds at home 5. ESRD - sp CRRT, back on HD MWF.  We are not using the L femoral temp HD cath anymore (placed for CRRT), it should be removed when not needed for other purposes.  6. Anemia of CKD - Hb 7-8 range. getting darbe max dose 200 ug / wk on Wed 7. Volume - 12 kg down from admit, 2kg over dry now, sig edema and ^BP still 8. DM - per primary   Plan - HD today, cont to lower volume   RKelly SplinterMD CWalshpager [phone]  04/23/2017, 10:02 AM   Recent Labs  Lab 04/22/17 0323 04/22/17 2322 04/23/17 0536  NA 137 135 134*  K 3.3* 3.5 3.3*  CL 96* 96* 96*  CO2  23 19* 21*  GLUCOSE 114* 110* 122*  BUN 24* 30* 32*  CREATININE 3.82* 4.69* 4.81*  CALCIUM 8.8* 8.6* 8.5*  PHOS 5.0* 5.6* 5.7*   Recent Labs  Lab 04/18/17 0635  04/22/17 0323 04/22/17 2322 04/23/17 0536  AST 290*  --  54*  --   --   ALT 140*  --  94*  --   --   ALKPHOS 281*  --  218*  --   --   BILITOT 1.5*  --  1.8*  --   --   PROT 5.8*  --  6.3*  --   --   ALBUMIN 2.2*   < > 2.2* 2.0* 1.9*   < > = values in this interval not displayed.   Recent Labs  Lab 04/21/17 0522 04/22/17 0323 04/23/17 0500  WBC 10.2 10.4 8.3  NEUTROABS 7.8* 7.9* 6.4  HGB 7.1* 7.7* 7.3*  HCT 23.3* 24.5* 23.2*  MCV 91.7 92.5 91.3  PLT 221 245 250   Iron/TIBC/Ferritin/ %Sat    Component Value Date/Time   IRON 124 10/31/2014 0405   TIBC 157 (L) 10/31/2014 0405   FERRITIN 780 (H) 10/31/2014 0405   IRONPCTSAT 79 (H) 10/31/2014 0405

## 2017-04-23 NOTE — Progress Notes (Signed)
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  The patient was profoundly acidotic and once that was addressed patient had a stable rhythm and was brought to the ICU for CRRT to address profound metabolic acidosis.  Patient was unresponsive upon arrival but with stabilization of BP reportedly became responsive.  PCCM was consulted for vent and vital signs management. Cardiac arrest x2 and 48 hours suspected from profound acidotic state.  SUBJECTIVE/INTERVAL: [name] 11/27, tolerated well.  Attempted PS wean this AM but did not tolerate due to tachypnea and pain.  VITAL SIGNS: BP (!) 159/70   Pulse 80   Temp 99.1 F (37.3 C) (Core)   Resp (!) 24   Ht 6\' 4"  (1.93 m)   Wt 91.5 kg (201 lb 11.5 oz)   LMP 10/09/2015 Comment: pt on dialysis  SpO2 100%   BMI 24.55 kg/m   HEMODYNAMICS:    VENTILATOR SETTINGS: Vent Mode: PRVC FiO2 (%):  [40 %-50 %] 40 % Set Rate:  [24 bmp] 24 bmp Vt Set:  [621 mL] 660 mL PEEP:  [5 cmH20] 5 cmH20 Plateau Pressure:  [19 cmH20-25 cmH20] 21 cmH20  INTAKE / OUTPUT: I/O last 3 completed shifts: In: 4238.5 [I.V.:3508.5; NG/GT:230; IV Piggyback:500] Out: 2015 [Emesis/NG output:1600; Drains:415]  PHYSICAL EXAMINATION:  General: adult female on vent in NAD. HEENT:[place]/AT, PERRL, no JVD. Neuro: Somnolent but opens eyes to voice.  Follows some basic commands. CV: RRR, no MRG. PULM: CTAB. GI: Soft, non-tender, non-distended. Extremities/skin: Bilateral lower extremities with ischemic changes, right foot worse than left.  LABS:  BMET Recent Labs   Lab 04/22/17 0323 04/22/17 2322 04/23/17 0536  NA 137 135 134*  K 3.3* 3.5 3.3*  CL 96* 96* 96*  CO2 23 19* 21*  BUN 24* 30* 32*  CREATININE 3.82* 4.69* 4.81*  GLUCOSE 114* 110* 122*    Electrolytes Recent Labs  Lab 04/22/17 0323 04/22/17 2322 04/23/17 0536  CALCIUM 8.8* 8.6* 8.5*  MG 2.1 2.0 2.0  PHOS 5.0* 5.6* 5.7*    CBC Recent Labs  Lab 04/21/17 0522 04/22/17 0323 04/23/17 0500  WBC 10.2 10.4 8.3  HGB 7.1* 7.7* 7.3*  HCT 23.3* 24.5* 23.2*  PLT 221 245 250    Coag's Recent Labs  Lab 04/18/17 0635 04/22/17 1020  INR 1.31 1.20    Sepsis Markers Recent Labs  Lab 04/18/17 0606 04/18/17 1610 04/20/17 1500 04/21/17 0522 04/22/17 0323  LATICACIDVEN 9.2* 1.0  --   --   --   PROCALCITON  --   --  26.08 22.87 18.61    ABG Recent Labs  Lab 04/18/17 0624 04/18/17 0925 04/22/17 0352  PHART 7.207* 7.486* 7.514*  PCO2ART 50.1* 33.6 28.6*  PO2ART 37.0* 268* 69.0*    Liver Enzymes Recent Labs  Lab 04/18/17 0635  04/22/17 0323 04/22/17 2322 04/23/17 0536  AST 290*  --  54*  --   --   ALT 140*  --  94*  --   --   ALKPHOS 281*  --  218*  --   --   BILITOT 1.5*  --  1.8*  --   --  ALBUMIN 2.2*   < > 2.2* 2.0* 1.9*   < > = values in this interval not displayed.    Cardiac Enzymes Recent Labs  Lab 04/18/17 0635  TROPONINI 0.67*    Glucose Recent Labs  Lab 04/22/17 1148 04/22/17 1553 04/22/17 2103 04/22/17 2330 04/23/17 0502 04/23/17 0806  GLUCAP 137* 116* 95 103* 125* 104*    Imaging Dg Abd 1 View  Result Date: 04/22/2017 CLINICAL DATA:  41 year old female feeding tube placement. EXAM: ABDOMEN - 1 VIEW COMPARISON:  04/20/2017 and earlier. FINDINGS: Portable AP supine view at 1402 hours. There is an NG type tube present in the stomach with side hole at the level of the mid gastric body. The tip projects just across midline. There is no other enteric tube identified in the visible lower mediastinum or abdomen. Paucity of bowel gas.  Nonobstructed bowel-gas pattern. Extensive vascular calcifications in the abdomen and upper pelvis. Mild streaky right lung base opacity. No acute osseous abnormality identified. IMPRESSION: 1. NG tube present in the stomach with side hole at the level of the gastric body. No other enteric tube in the abdomen. 2. Advanced Aortic Atherosclerosis (ICD10-I70.0). Electronically Signed   By: Genevie Ann M.D.   On: 04/22/2017 14:25   Dg Chest Port 1 View  Result Date: 04/22/2017 CLINICAL DATA:  Tracheostomy tube placement EXAM: PORTABLE CHEST 1 VIEW COMPARISON:  04/22/2017 FINDINGS: Tracheostomy tube with the tip 5.2 cm above the carina. Bilateral mild interstitial thickening. No pleural effusion or pneumothorax. Stable cardiomegaly. No acute osseous abnormality. IMPRESSION: Tracheostomy tube with the tip 5.2 cm above the carina. Electronically Signed   By: Kathreen Devoid   On: 04/22/2017 12:36    STUDIES:  Abdominal/chest CT 11/21>>> no evidence of pulmonary embolism.  Nondiagnostic CT of the abdomen and pelvis.  Probable hematoma in the right superficial femoral Artery.  Moderate to large volume this ascites Head CT 11/21>>> not done KUB 11/25 no evidence ileus or obstruction.  CT chest 11/26 >  Acute anterior right second through fifth and anterior left third through fifth rib fractures, many of which are displaced. Nondisplaced mid sternal fracture. Bilateral lower lobe airspace consolidations, atelectasis versus aspiration. Small pleural effusions. Suspect trace pulmonary edema. Aortic atherosclerosis with coronary artery disease.  CULTURES: Blood 11/21>>> Blood culture 04/19/2017>> Urine culture 04/20/2017 done via in and out cath with 30 cc obtained>> Sputum culture 04/20/2017>>  ANTIBIOTICS: Levaquin 11/21>>> Flagyl 11/21>>> Vancomycin 11/25/ >>>  SIGNIFICANT EVENTS:  11/21>>> Cardiac arrest in OR 04/17/2017 extubated 04/18/2017>> second cardiac arrest requiring intubation and 30 minutes of  CPR 04/20/2017 in and out cath with 30 cc obtained 04/20/2017 no extubation at this time secondary to chest wall pain 11/27 trach  LINES/TUBES: ETT 11/21>>>11/22 reintubated 11/23 for cardiac arrest>> 11/27 L femoral HD catheter 11/21>>> 11/28 R radial a-line 11/21>>> 11/26 Trach 11/27 >   DISCUSSION: 41 year old type I diabetic with all the classic complications with cardiac arrest in the OR for 2 hours that is now following commands so no need for cooling.  Rapidly coming off pressors with addressing of acidosis.  Cause of arrest suspected to be acidosis after reperfusion in a patient with ESRD vs ischemic bowel.  Slow to wean due to chest pain but wakes up and is neuro intact.   ASSESSMENT / PLAN:  PULMONARY A: ARDS and VDRF post CPR for 2 hours > improved Difficulty weaning due to chest wall pain - s/p trach 11/27 Mild pulmonary edema Multiple rib Fx many of  which displaced P:   Full vent support, has not tolerated weaning thus far Continue brief periods of daily weaning trials with gradual progression Follow CXR  VAP bundle Curbside CVTS feel little benefit for rib plating at this point as she will likely require trach regardless.  CARDIOVASCULAR A:  Circulatory Shock, unknown cause, resolved after resolution of acidosis HTN Arrest x2 P:  Continue clevidipine infusion for SBP goal 160-170 EKG monitoring Cardiology following  RENAL A:   ESRD Metabolic acidosis Hyponatremia - suspect hypervolemic Hypokalemia P:   Nephrology following for HD (M/W/F) Follow BMP  GASTROINTESTINAL A:   Nutrition Ileus? Transaminitis - Abdominal CT is done 04/18/2018 which demonstrated probable hematoma in the right superficial femoral artery.  Also notes a large to moderate volume of ascites. KUB 11/25 with no evidence ileus or obstruction. P:   Continue TF (overnight was held due to nausea and large residuals; now resolved) Continue bowel regimen PPI  HEMATOLOGIC A:   No  active issues P:  CBC Transfuse per ICU protocol  INFECTIOUS A:   Concern for ischemic bowel or intraabdominal infection Occult sepsis concern bacteremia, intraabdominal, PCT elevated -11/21 blood cultures x2> neg -04/19/2017 blood cultures x2>> -11/25 sputum>> P:   Continue empiric abx (Levaquin, Flagyl, Vanc)  Follow cultures Follow PCT (26>23>18) Limit tylenol with bump in AST/ALT   ENDOCRINE A:   DM   P:   SSI CBG  NEUROLOGIC A:   Acute encephalopathy (improved) P:   RASS goal: 0 to -1 Fentanyl infusion / Propofol infusion Pain control as needed.  Her sternum is extremely tender from previous CPR x 2  FAMILY  - Updates: No family available  - Inter-disciplinary family meet or Palliative Care meeting due by:  day 7  CC time: 30 min.   Montey Hora, [place] [facility] Pager: [phone]  or [phone] 04/23/2017, 9:52 AM  Attending Note:  41 year old female s/p CPR in the OR for 2 hours and 40 minutes in the ICU with flail chest that failed weaning this AM.  On exam, lungs are clear but when placed on low PS the patient clearly had chest wall instability.  I reviewed CxR myself, trach is in good position.  Chest CT showed multiple fractures.  Not a candidate for plating at this point.  Will continue weaning efforts.  Anticipate will need long term wean.  If fails weaning again in AM then will consider PEG placement for prolonged weaning as will not pass SLP while on the vent.  The patient is critically ill with multiple organ systems failure and requires high complexity decision making for assessment and support, frequent evaluation and titration of therapies, application of advanced monitoring technologies and extensive interpretation of multiple databases.   Critical Care Time devoted to patient care services described in this note is  35  Minutes. This time reflects time of care of this signee Dr Jennet Maduro. This  critical care time does not reflect procedure time, or teaching time or supervisory time of PA/NP/Med student/Med Resident etc but could involve care discussion time.  Rush Farmer, M.D. [facility] Pulmonary/Critical Care Medicine. Pager: [phone]. After hours pager: [phone].

## 2017-04-23 NOTE — Progress Notes (Addendum)
CBG 59, Hypoglycemic Protocol Initiated. 17mL D50 Second CBG 122

## 2017-04-23 NOTE — Progress Notes (Signed)
Pharmacy Antibiotic Note  [name] is a 41 y.o. female admitted on 04/16/2017 with intra-abdominal infection.  Pharmacy has been consulted for levofloxacin dosing, also on metronidazole per MD. Pt recently on CRRT now transitioned back to [facility] MWF.   No fevers overnight, wbc 8, plan is for dialysis today. No vancomycin appears to have been given 11/26?  Plan: Continue Levofloxacin 500mg  IV q48h Continue Metronidazole 500mg  IV q8h Continue vancomycin 1g after each HD MWF Plan on checking vancomycin trough as needed later this week.  Height: 6\' 4"  (193 cm) Weight: 201 lb 11.5 oz (91.5 kg) IBW/kg (Calculated) : 82.3  Temp (24hrs), Avg:99.1 F (37.3 C), Min:98.8 F (37.1 C), Max:99.5 F (37.5 C)  Recent Labs  Lab 04/18/17 0606  04/18/17 1610 04/19/17 0356  04/20/17 0432  04/21/17 0521 04/21/17 0522 04/21/17 1851 04/22/17 0323 04/22/17 2322 04/23/17 0500 04/23/17 0536  WBC  --    < >  --  9.0  --  9.3  --   --  10.2  --  10.4  --  8.3  --   CREATININE  --    < > 3.89* 4.54*   < > 3.20*  3.30*   < > 4.57*  --  3.27* 3.82* 4.69*  --  4.81*  LATICACIDVEN 9.2*  --  1.0  --   --   --   --   --   --   --   --   --   --   --    < > = values in this interval not displayed.    Estimated Creatinine Clearance: 20 mL/min (A) (by C-G formula based on SCr of 4.81 mg/dL (H)).    Allergies  Allergen Reactions  . Cephalexin Other (See Comments)    Reaction unknown- Childhood allergy Tolerated Ceftriaxone in the past  . Sulfamethoxazole-Trimethoprim Other (See Comments)    Unknown reaction. Pt states that she was told by her mother that she had allergy to Bactrim as a child.    Antimicrobials this admission: 11/21 Levofloxacin >>  11/21 Flagyl>> 11/25 Vanc>>  Microbiology results: 11/24: ngtd 11/21 BCx: ng 11/25: TA :normal flora 11/25 urine: ng 10/29 MRSA PCR: negative  Thank you for allowing pharmacy to be a part of this patient's care.  Erin Hearing PharmD.,  BCPS Clinical Pharmacist Pager [phone] 04/23/2017 12:29 PM

## 2017-04-23 NOTE — Progress Notes (Signed)
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 CORTRAK can be placed post-pyloric position   -With current Cleviprex and diprivan rate, recommend Vital High Protein at 10 ml/hr with addition of Pro-Stat 60 mL (2 packets) 5 times daily  HD M-W-F Heparin DVT prophylaxis  Fentanyl and  Propofol for sedation and comfort Cleviprex  For BP   HGB 7.3 plan for transfusion at HD   [facility] 04/23/2017 9:25 AM --  Laboratory Lab Results: Recent Labs    04/22/17 0323 04/23/17 0500  WBC 10.4 8.3  HGB 7.7* 7.3*  HCT 24.5* 23.2*  PLT 245 250   BMET Recent Labs    04/22/17 2322 04/23/17 0536  NA 135 134*  K 3.5 3.3*  CL 96* 96*  CO2 19* 21*  GLUCOSE 110* 122*  BUN 30* 32*  CREATININE 4.69* 4.81*  CALCIUM 8.6* 8.5*    COAG Lab Results  Component Value Date   INR 1.20 04/22/2017   INR 1.31 04/18/2017   INR 1.14 04/15/2017   No results found for: PTT

## 2017-04-24 ENCOUNTER — Inpatient Hospital Stay (HOSPITAL_COMMUNITY): Payer: Medicare Other

## 2017-04-24 DIAGNOSIS — I739 Peripheral vascular disease, unspecified: Secondary | ICD-10-CM

## 2017-04-24 DIAGNOSIS — R451 Restlessness and agitation: Secondary | ICD-10-CM

## 2017-04-24 LAB — RENAL FUNCTION PANEL
ALBUMIN: 2.1 g/dL — AB (ref 3.5–5.0)
Anion gap: 17 — ABNORMAL HIGH (ref 5–15)
BUN: 20 mg/dL (ref 6–20)
CALCIUM: 8.3 mg/dL — AB (ref 8.9–10.3)
CO2: 23 mmol/L (ref 22–32)
CREATININE: 3.22 mg/dL — AB (ref 0.44–1.00)
Chloride: 94 mmol/L — ABNORMAL LOW (ref 101–111)
GFR, EST AFRICAN AMERICAN: 19 mL/min — AB (ref >60)
GFR, EST NON AFRICAN AMERICAN: 17 mL/min — AB (ref >60)
Glucose, Bld: 150 mg/dL — ABNORMAL HIGH (ref 65–99)
PHOSPHORUS: 4.7 mg/dL — AB (ref 2.5–4.6)
Potassium: 3.8 mmol/L (ref 3.5–5.1)
SODIUM: 134 mmol/L — AB (ref 135–145)

## 2017-04-24 LAB — CULTURE, BLOOD (ROUTINE X 2)
Culture: NO GROWTH
SPECIAL REQUESTS: ADEQUATE

## 2017-04-24 LAB — BLOOD GAS, ARTERIAL
ACID-BASE EXCESS: 2.5 mmol/L — AB (ref 0.0–2.0)
Bicarbonate: 24.4 mmol/L (ref 20.0–28.0)
DRAWN BY: 44166
FIO2: 40
O2 SAT: 95.1 %
PEEP/CPAP: 5 cmH2O
PH ART: 7.587 — AB (ref 7.350–7.450)
Patient temperature: 99.6
RATE: 24 resp/min
VT: 660 mL
pCO2 arterial: 25.6 mmHg — ABNORMAL LOW (ref 32.0–48.0)
pO2, Arterial: 75.7 mmHg — ABNORMAL LOW (ref 83.0–108.0)

## 2017-04-24 LAB — GLUCOSE, CAPILLARY
GLUCOSE-CAPILLARY: 146 mg/dL — AB (ref 65–99)
Glucose-Capillary: 149 mg/dL — ABNORMAL HIGH (ref 65–99)
Glucose-Capillary: 177 mg/dL — ABNORMAL HIGH (ref 65–99)
Glucose-Capillary: 152 mg/dL — ABNORMAL HIGH (ref 65–99)
Glucose-Capillary: 111 mg/dL — ABNORMAL HIGH (ref 65–99)

## 2017-04-24 LAB — CBC WITH DIFFERENTIAL/PLATELET
Basophils Absolute: 0 10*3/uL (ref 0.0–0.1)
Basophils Relative: 0 %
EOS ABS: 0.1 10*3/uL (ref 0.0–0.7)
EOS PCT: 1 %
HCT: 28 % — ABNORMAL LOW (ref 36.0–46.0)
Hemoglobin: 9 g/dL — ABNORMAL LOW (ref 12.0–15.0)
LYMPHS ABS: 0.7 10*3/uL (ref 0.7–4.0)
Lymphocytes Relative: 5 %
MCH: 28.7 pg (ref 26.0–34.0)
MCHC: 32.1 g/dL (ref 30.0–36.0)
MCV: 89.2 fL (ref 78.0–100.0)
MONO ABS: 1.1 10*3/uL — AB (ref 0.1–1.0)
MONOS PCT: 8 %
Neutro Abs: 12 10*3/uL — ABNORMAL HIGH (ref 1.7–7.7)
Neutrophils Relative %: 86 %
PLATELETS: 290 10*3/uL (ref 150–400)
RBC: 3.14 MIL/uL — ABNORMAL LOW (ref 3.87–5.11)
RDW: 17.8 % — ABNORMAL HIGH (ref 11.5–15.5)
WBC: 13.9 10*3/uL — ABNORMAL HIGH (ref 4.0–10.5)

## 2017-04-24 LAB — MAGNESIUM: MAGNESIUM: 2 mg/dL (ref 1.7–2.4)

## 2017-04-24 MED ORDER — ACETYLCYSTEINE 20 % IN SOLN
4.0000 mL | Freq: Four times a day (QID) | RESPIRATORY_TRACT | Status: DC
  Administered 2017-04-24 – 2017-04-27 (×11): 4 mL via RESPIRATORY_TRACT
  Filled 2017-04-24 (×12): qty 4

## 2017-04-24 MED ORDER — METHYLDOPA 250 MG PO TABS
500.0000 mg | ORAL_TABLET | Freq: Every day | ORAL | Status: DC
  Administered 2017-04-24 – 2017-04-28 (×3): 500 mg via ORAL
  Filled 2017-04-24 (×5): qty 2

## 2017-04-24 MED ORDER — ORAL CARE MOUTH RINSE
15.0000 mL | OROMUCOSAL | Status: DC
  Administered 2017-04-24 – 2017-04-28 (×27): 15 mL via OROMUCOSAL

## 2017-04-24 MED ORDER — LABETALOL HCL 300 MG PO TABS
300.0000 mg | ORAL_TABLET | Freq: Three times a day (TID) | ORAL | Status: DC
  Administered 2017-04-24 – 2017-04-28 (×10): 300 mg via ORAL
  Filled 2017-04-24 (×14): qty 1

## 2017-04-24 MED ORDER — CHLORHEXIDINE GLUCONATE 0.12% ORAL RINSE (MEDLINE KIT)
15.0000 mL | Freq: Two times a day (BID) | OROMUCOSAL | Status: DC
  Administered 2017-04-24 – 2017-04-27 (×7): 15 mL via OROMUCOSAL

## 2017-04-24 MED ORDER — HYDRALAZINE HCL 50 MG PO TABS
100.0000 mg | ORAL_TABLET | Freq: Three times a day (TID) | ORAL | Status: DC
  Administered 2017-04-24 – 2017-04-27 (×6): 100 mg via ORAL
  Filled 2017-04-24 (×7): qty 2

## 2017-04-24 MED ORDER — ACETYLCYSTEINE 20 % IN SOLN
4.0000 mL | Freq: Four times a day (QID) | RESPIRATORY_TRACT | Status: DC
  Administered 2017-04-24: 4 mL via RESPIRATORY_TRACT
  Filled 2017-04-24: qty 4

## 2017-04-24 NOTE — Progress Notes (Addendum)
  Progress Note    04/24/2017 10:09 AM 8 Days Post-Op  Subjective:  Mechanical ventilation and sedation   Vitals:   04/24/17 0915 04/24/17 0930  BP: (!) 143/72   Pulse: 88   Resp: 16   Temp: 99.1 F (37.3 C)   SpO2: 96% 96%   Physical Exam: Cardiac:  RRR Lungs:  ventilator Incisions:  R groin incision intact; 100cc lymphatic drainage overnight; R PT/AT by doppler; L PT by doppler; stable gangrene R foot Abdomen:  Somewhat distended and quiet Neurologic: sedated  CBC    Component Value Date/Time   WBC 13.9 (H) 04/24/2017 0423   RBC 3.14 (L) 04/24/2017 0423   HGB 9.0 (L) 04/24/2017 0423   HCT 28.0 (L) 04/24/2017 0423   PLT 290 04/24/2017 0423   MCV 89.2 04/24/2017 0423   MCH 28.7 04/24/2017 0423   MCHC 32.1 04/24/2017 0423   RDW 17.8 (H) 04/24/2017 0423   LYMPHSABS 0.7 04/24/2017 0423   MONOABS 1.1 (H) 04/24/2017 0423   EOSABS 0.1 04/24/2017 0423   BASOSABS 0.0 04/24/2017 0423    BMET    Component Value Date/Time   NA 134 (L) 04/24/2017 0423   K 3.8 04/24/2017 0423   CL 94 (L) 04/24/2017 0423   CO2 23 04/24/2017 0423   GLUCOSE 150 (H) 04/24/2017 0423   BUN 20 04/24/2017 0423   CREATININE 3.22 (H) 04/24/2017 0423   CALCIUM 8.3 (L) 04/24/2017 0423   GFRNONAA 17 (L) 04/24/2017 0423   GFRAA 19 (L) 04/24/2017 0423    INR    Component Value Date/Time   INR 1.20 04/22/2017 1020     Intake/Output Summary (Last 24 hours) at 04/24/2017 1009 Last data filed at 04/24/2017 0900 Gross per 24 hour  Intake 2722.04 ml  Output 6745 ml  Net -4022.96 ml     Assessment/Plan:  41 y.o. female is s/p s/p R CFA endarterectomy with cardiac arrest intra-operatively7Days Post-Op suffered a cardiac arrest with resuscitative efforts for 2 hours 8 Days Post-Op \  Continue JP drain R groin; >100cc output over last 24 hours; if lymphatic leak persists, patient may require re-exploration of R groin in OR next week NG >300 over the past 24 hours Weaning  ventilation/sedation Critical care per critical care team R foot ischemic changes are stable; R foot will be addressed if foot worsens or if clinical picture improves DVT prophylaxis:  subq heparin   Dagoberto Ligas, PA-C [facility] [phone] 04/24/2017 10:09 AM   I agree with the above.  She is stable from a vascular perspective.  She continues to have significant lymphatic drainage out of the JP in her right groin.  At this point, I have no plans for surgical intervention given her recent history.  I will continue to monitor the JP output.  If it becomes less than 30 cc in a 24-hour.  I will remove the drain.  If he continues to be high volume, she may require operative exploration.  At some point, she is going to need transmetatarsal amputation by Dr. Gavin Potters.  The daughter was updated at the bedside.  [name]

## 2017-04-24 NOTE — Progress Notes (Addendum)
[facility] Progress Note  Subjective: no new problems  Vitals:   04/24/17 0500 04/24/17 0600 04/24/17 0739 04/24/17 0751  BP: (!) 149/80 (!) 146/77 (!) 147/80 (!) 148/80  Pulse: 91 87 88 89  Resp: (!) _0 Temp:    98.9 F (37.2 C)  TempSrc:    Oral  SpO2: 98% 98% 98% 97%  Weight:      Height:        Inpatient medications: . amLODipine  10 mg Oral Daily  . chlorhexidine gluconate (MEDLINE KIT)  15 mL Mouth Rinse BID  . Chlorhexidine Gluconate Cloth  6 each Topical Daily  . cloNIDine  0.3 mg Per NG tube TID  . darbepoetin (ARANESP) injection - DIALYSIS  200 mcg Intravenous Q Wed-HD  . docusate  100 mg Per Tube BID  . feeding supplement (PRO-STAT SUGAR FREE 64)  60 mL Per Tube 5 X Daily  . fentaNYL (SUBLIMAZE) injection  200 mcg Intravenous Once  . heparin  5,000 Units Subcutaneous Q8H  . insulin aspart  2-6 Units Subcutaneous Q4H  . mouth rinse  15 mL Mouth Rinse QID  . pantoprazole sodium  40 mg Per Tube Daily  . sennosides  5 mL Per Tube BID  . sodium chloride flush  10-40 mL Intracatheter Q12H  . spironolactone  100 mg Per NG tube Daily   . sodium chloride    . sodium chloride    . sodium chloride    . clevidipine 16 mg/hr (04/24/17 0523)  . feeding supplement (VITAL HIGH PROTEIN) 1,000 mL (04/23/17 1800)  . fentaNYL infusion INTRAVENOUS 350 mcg/hr (04/24/17 0230)  . levofloxacin (LEVAQUIN) IV 500 mg (04/22/17 1737)  . metronidazole Stopped (04/24/17 5009)  . propofol 50 mcg/kg/min (04/24/17 0523)  . vancomycin Stopped (04/23/17 2006)   sodium chloride, sodium chloride, sodium chloride, acetaminophen (TYLENOL) oral liquid 160 mg/5 mL, alteplase, bisacodyl, fentaNYL (SUBLIMAZE) injection, heparin, hydrALAZINE, ipratropium-albuterol, labetalol, lidocaine (PF), lidocaine-prilocaine, midazolam, ondansetron (ZOFRAN) IV, pentafluoroprop-tetrafluoroeth, sodium chloride flush  Exam: On vent, follows simple commands, eyes closed Trach in place Chest  coarse BS RRR no mrg Abd distended, nontender, +BS RUE no sig edmea of ext L groin trialysis cath/ LUE AVF+bruit  Dialysis:  MWF  4h 57mn   89kg (not getting there)   Hep 2000  LFA AVF -hect 7 ug -mircera 225 due 11/28 -last Hb 9.3, Ca 8.7/ P 9, pth 228 -sensipar 60/ auryxia  -home BP Rx : norvasc 10/ clon 0.3 tid/ hydralazine 100 tid/ labetalol 300 tid/ aldomet 500 qd      Impression: 1. SP cardiac arrest - 11/21, CPR x 2 hrs. 2nd arrest on 11/23 2. VDRF -  CXR improving, vasc congestion still 3. SP R fem artery endarterectomy  - 11/21 by VVS 4. HTN , severe at baseline - is on 5 BP meds at home and normal BP's are 1381 2829systolic.  Have d/w Dr GEinar Gipher cardiologist. Severe HTN. Have adjusted parameters to wean off clevidipine with permissive HTN in mind, and have resumed all of home meds to be given NG.  5. ESRD - sp CRRT, back on HD MWF.  L femoral temp HD cath should be removed when not needed for other purposes.  6. Anemia of CKD - Hb 7-8 range. getting darbe max dose 200 ug / wk on Wed 7. Volume - at dry wt now, edema resolved. CXR better, doubt edema, poor film 8. DM - per primary   Plan - as  above. HD tomorrow. Wean off clevidipine gtt.    Rob  MD [facility] pager [phone]   04/24/2017, 8:55 AM   Recent Labs  Lab 04/22/17 2322 04/23/17 0536 04/24/17 0423  NA 135 134* 134*  K 3.5 3.3* 3.8  CL 96* 96* 94*  CO2 19* 21* 23  GLUCOSE 110* 122* 150*  BUN 30* 32* 20  CREATININE 4.69* 4.81* 3.22*  CALCIUM 8.6* 8.5* 8.3*  PHOS 5.6* 5.7* 4.7*   Recent Labs  Lab 04/18/17 0635  04/22/17 0323 04/22/17 2322 04/23/17 0536 04/24/17 0423  AST 290*  --  54*  --   --   --   ALT 140*  --  94*  --   --   --   ALKPHOS 281*  --  218*  --   --   --   BILITOT 1.5*  --  1.8*  --   --   --   PROT 5.8*  --  6.3*  --   --   --   ALBUMIN 2.2*   < > 2.2* 2.0* 1.9* 2.1*   < > = values in this interval not displayed.   Recent Labs  Lab  04/22/17 0323 04/23/17 0500 04/24/17 0423  WBC 10.4 8.3 13.9*  NEUTROABS 7.9* 6.4 12.0*  HGB 7.7* 7.3* 9.0*  HCT 24.5* 23.2* 28.0*  MCV 92.5 91.3 89.2  PLT 245 250 290   Iron/TIBC/Ferritin/ %Sat    Component Value Date/Time   IRON 124 10/31/2014 0405   TIBC 157 (L) 10/31/2014 0405   FERRITIN 780 (H) 10/31/2014 0405   IRONPCTSAT 79 (H) 10/31/2014 0405     

## 2017-04-24 NOTE — Progress Notes (Signed)
eLink Physician-Brief Progress Note Patient Name: [name] DOB: 03/31/1976 MRN: [id number]   Date of Service  04/24/2017  HPI/Events of Note  Resp Alkalosis on vent  eICU Interventions  Reduced RR to 16      Intervention Category Major Interventions: Acid-Base disturbance - evaluation and management  DETERDING,ELIZABETH 04/24/2017, 4:24 AM

## 2017-04-24 NOTE — Progress Notes (Signed)
Wake up assessment performed. After, patient awoke, moves all extremities, responsive, to family. Started coughing splinting, o2 saturations decreased. Suctioned when coughing, got thick reddish sputum. Patient placed on 100% breaths, but desaturating, bagged patient at 100% resuctioned. RT notified, placed on 100% for 5-10 minutes. Then decreased by RT to 50%. .Placed back on sedation , titration per need and order. sao2  94-95% post, patient coughing less, less restless.

## 2017-04-24 NOTE — Progress Notes (Signed)
VASCULAR LAB PRELIMINARY  ARTERIAL  ABI completed:Right great toe pressure is abnormal.  Left great toe pressure is within normal limits.     RIGHT    LEFT    PRESSURE WAVEFORM  PRESSURE WAVEFORM  BRACHIAL   BRACHIAL    DP   DP    AT   AT    PT   PT    PER   PER    GREAT TOE 12 NA GREAT TOE 70 NA    RIGHT LEFT  ABI       Trampus Mcquerry, RVT 04/24/2017, 10:17 AM

## 2017-04-24 NOTE — Consult Note (Signed)
   [facility] CM Inpatient Consult   04/24/2017  [name] 1976-04-15 [id number]  Patient assessed for re-admissions and ED visits in the past 30 days in the Medicare ACO.  Patient admitted with Cardiac Arrest x 2 in acidotic state per Critical Care MD notes.  Patient is currently on vent.  Will follow for progress and disposition for needs.  For questions or referral please contact:  Natividad Brood, RN BSN [facility] Liaison  [phone] business mobile phone Toll free office [phone]

## 2017-04-24 NOTE — Progress Notes (Signed)
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  The patient was profoundly acidotic and once that was addressed patient had a stable rhythm and was brought to the ICU for CRRT to address profound metabolic acidosis.  Patient was unresponsive upon arrival but with stabilization of BP reportedly became responsive.  PCCM was consulted for vent and vital signs management. Cardiac arrest x2 and 48 hours suspected from profound acidotic state.  SUBJECTIVE/INTERVAL:  Episode of desaturation and mucous plugging this AM.  Resolved with suctioning and BVM.   VITAL SIGNS: BP (!) 148/80   Pulse 89   Temp 98.9 F (37.2 C) (Oral)   Resp 15   Ht 6\' 4"  (1.93 m)   Wt 89.6 kg (197 lb 8.5 oz)   LMP 10/09/2015 Comment: pt on dialysis  SpO2 95%   BMI 24.04 kg/m   HEMODYNAMICS:    VENTILATOR SETTINGS: Vent Mode: PRVC FiO2 (%):  [40 %-50 %] 50 % Set Rate:  [16 bmp-24 bmp] 16 bmp Vt Set:  [660 mL] 660 mL PEEP:  [5 cmH20] 5 cmH20 Plateau Pressure:  [20 cmH20-22 cmH20] 20 cmH20  INTAKE / OUTPUT: I/O last 3 completed shifts: In: 4871.8 [I.V.:3882.6; NG/GT:189.2; IV JQGBEEFEO:712] Out: 1975 [Emesis/NG output:2600; Drains:215; Other:4500]  PHYSICAL EXAMINATION:  General: adult female on vent in NAD. HEENT:[place]/AT, PERRL, no JVD. Neuro: Somnolent but opens eyes to voice.  Not following commands this AM (just received propofol bolus). CV: RRR, no MRG. PULM: CTAB. GI: Soft, non-tender, non-distended. Extremities/skin: Bilateral lower extremities with ischemic changes, right foot worse than  left.  LABS:  BMET Recent Labs  Lab 04/22/17 2322 04/23/17 0536 04/24/17 0423  NA 135 134* 134*  K 3.5 3.3* 3.8  CL 96* 96* 94*  CO2 19* 21* 23  BUN 30* 32* 20  CREATININE 4.69* 4.81* 3.22*  GLUCOSE 110* 122* 150*    Electrolytes Recent Labs  Lab 04/22/17 2322 04/23/17 0536 04/24/17 0423  CALCIUM 8.6* 8.5* 8.3*  MG 2.0 2.0 2.0  PHOS 5.6* 5.7* 4.7*    CBC Recent Labs  Lab 04/22/17 0323 04/23/17 0500 04/24/17 0423  WBC 10.4 8.3 13.9*  HGB 7.7* 7.3* 9.0*  HCT 24.5* 23.2* 28.0*  PLT 245 250 290    Coag's Recent Labs  Lab 04/18/17 0635 04/22/17 1020  INR 1.31 1.20    Sepsis Markers Recent Labs  Lab 04/18/17 0606 04/18/17 1610 04/20/17 1500 04/21/17 0522 04/22/17 0323  LATICACIDVEN 9.2* 1.0  --   --   --   PROCALCITON  --   --  26.08 22.87 18.61    ABG Recent Labs  Lab 04/18/17 0925 04/22/17 0352 04/24/17 0400  PHART 7.486* 7.514* 7.587*  PCO2ART 33.6 28.6* 25.6*  PO2ART 268* 69.0* 75.7*    Liver Enzymes Recent Labs  Lab 04/18/17 0635  04/22/17 0323 04/22/17 2322 04/23/17 0536 04/24/17 0423  AST 290*  --  54*  --   --   --   ALT 140*  --  94*  --   --   --   ALKPHOS 281*  --  218*  --   --   --  BILITOT 1.5*  --  1.8*  --   --   --   ALBUMIN 2.2*   < > 2.2* 2.0* 1.9* 2.1*   < > = values in this interval not displayed.    Cardiac Enzymes Recent Labs  Lab 04/18/17 0635  TROPONINI 0.67*    Glucose Recent Labs  Lab 04/23/17 1155 04/23/17 1601 04/23/17 1959 04/23/17 2346 04/24/17 0355 04/24/17 0757  GLUCAP 122* 97 97 149* 149* 177*    Imaging Dg Abd 1 View  Result Date: 04/23/2017 CLINICAL DATA:  Feeding tube placement. EXAM: ABDOMEN - 1 VIEW COMPARISON:  Single-view of the abdomen 04/22/2017. FINDINGS: Feeding tube is in place. The tube is looped in the distal stomach with the tip projecting retrograde in the midbody. NG tube tip in the distal stomach is again seen. IMPRESSION: Feeding tube is looped in the distal  stomach with the tip projecting retrograde in the midbody. Electronically Signed   By: Inge Rise M.D.   On: 04/23/2017 11:40   Dg Chest Port 1 View  Result Date: 04/24/2017 CLINICAL DATA:  Respiratory failure. EXAM: PORTABLE CHEST 1 VIEW COMPARISON:  04/22/2017 FINDINGS: Cardiomegaly with vascular congestion and mild bilateral opacities, favor edema. No visible significant effusions. No acute bony abnormality. Tracheostomy is unchanged. IMPRESSION: Cardiomegaly with vascular congestion. Suspect mild interstitial edema. Electronically Signed   By: Rolm Baptise M.D.   On: 04/24/2017 08:34   Dg Abd Portable 1v  Result Date: 04/23/2017 CLINICAL DATA:  41 year old female with a history of feeding tube placement. EXAM: PORTABLE ABDOMEN - 1 VIEW COMPARISON:  04/23/2017 FINDINGS: Limited plain film of the abdomen. Metallic tip feeding tube courses over the lower mediastinum, stomach, and terminates in the third/ fourth portion of the duodenum. Gastric tube terminates within the stomach. Gas within small bowel and colon. Vascular calcifications. IMPRESSION: Metallic tip feeding tube terminates within the third/ fourth portion the duodenum. Gastric tube terminates within the stomach. Electronically Signed   By: Corrie Mckusick D.O.   On: 04/23/2017 13:34    STUDIES:  Abdominal/chest CT 11/21>>> no evidence of pulmonary embolism.  Nondiagnostic CT of the abdomen and pelvis.  Probable hematoma in the right superficial femoral Artery.  Moderate to large volume this ascites Head CT 11/21>>> not done KUB 11/25 no evidence ileus or obstruction.  CT chest 11/26 >  Acute anterior right second through fifth and anterior left third through fifth rib fractures, many of which are displaced. Nondisplaced mid sternal fracture. Bilateral lower lobe airspace consolidations, atelectasis versus aspiration. Small pleural effusions. Suspect trace pulmonary edema. Aortic atherosclerosis with coronary artery  disease.  CULTURES: Blood 11/21>>> Blood culture 04/19/2017>> Urine culture 04/20/2017 done via in and out cath with 30 cc obtained>> Sputum culture 04/20/2017>>  ANTIBIOTICS: Levaquin 11/21>>> Flagyl 11/21>>> Vancomycin 11/25/ >>>  SIGNIFICANT EVENTS:  11/21>>> Cardiac arrest in OR 04/17/2017 extubated 04/18/2017>> second cardiac arrest requiring intubation and 30 minutes of CPR 04/20/2017 in and out cath with 30 cc obtained 04/20/2017 no extubation at this time secondary to chest wall pain 11/27 trach  LINES/TUBES: ETT 11/21>>>11/22 reintubated 11/23 for cardiac arrest>> 11/27 L femoral HD catheter 11/21>>> 11/28 R radial a-line 11/21>>> 11/26 Trach 11/27 >   DISCUSSION: 41 year old type I diabetic with all the classic complications with cardiac arrest in the OR for 2 hours that is now following commands so no need for cooling.  Rapidly coming off pressors with addressing of acidosis.  Cause of arrest suspected to be acidosis after reperfusion in  a patient with ESRD vs ischemic bowel.  Slow to wean due to chest pain but wakes up and is neuro intact.   ASSESSMENT / PLAN:  PULMONARY A: ARDS and VDRF post CPR for 2 hours > improved Difficulty weaning due to chest wall pain - s/p trach 11/27 Mild pulmonary edema Resp alkalosis overnight 11/28 - rate adjusted  Multiple rib Fx many of which displaced P:   Full vent support, has not tolerated weaning thus far Continue brief periods of daily weaning trials with gradual progression as able Add mucomyst x 6 doses given recurrent plugging Follow CXR  VAP bundle Curbside CVTS feel little benefit for rib plating at this point as she will likely require trach regardless.  CARDIOVASCULAR A:  Circulatory Shock, unknown cause, resolved after resolution of acidosis HTN Arrest x2 P:  Continue clevidipine infusion for SBP goal 160-170 EKG monitoring Cardiology following  RENAL A:   ESRD - started on CRRT Metabolic acidosis -  resolved Hyponatremia - suspect hypervolemic Hypokalemia - resolved P:   Nephrology following for CRRT (M/W/F) Follow BMP  GASTROINTESTINAL A:   Nutrition Ileus? Transaminitis - Abdominal CT is done 04/18/2018 which demonstrated probable hematoma in the right superficial femoral artery.  Also notes a large to moderate volume of ascites. KUB 11/25 with no evidence ileus or obstruction. P:   Continue TF (overnight was held due to nausea and large residuals; now resolved) Might need PEG as anticipate prolonged wean from trach / vent support Continue bowel regimen PPI Repeat LFT's in AM  HEMATOLOGIC A:   Mild anemia P:  Transfuse for Hgb < 7 Follow CBC  INFECTIOUS A:   Concern for ischemic bowel or intraabdominal infection Occult sepsis concern bacteremia, intraabdominal, PCT elevated P:   Continue empiric abx (Levaquin, Flagyl, Vanc) - will continue through 11/30 for 10 days total then stop and monitor clinically Follow cultures through completion Follow PCT (26>23>18) - repeat in AM 11/30 Limit tylenol with bump in AST/ALT   ENDOCRINE A:   DM   P:   SSI CBG  NEUROLOGIC A:   Acute encephalopathy - with sedation for mechanical ventilation P:   RASS goal: 0 to -1 Fentanyl infusion / Propofol infusion Pain control as needed.  Her sternum is extremely tender from previous CPR x 2  FAMILY  - Updates: No family available  - Inter-disciplinary family meet or Palliative Care meeting due by:  day 7  CC time: 30 min.   Montey Hora, [facility] Pager: [phone]  or [phone] 04/24/2017, 9:06 AM   Attending Note:  41 year old female s/p cardiac arrest in the OR that was related to acidosis, now with multiple broken ribs and a flail chest.  On exam, able to breath some on PS but not enough to wean to TC, chest is too unstable.  I reviewed CXR myself, trach is in good position.  Multiple episodes of mucous plugging  overnight but now suctioned and doing well.  Will continue weaning efforts.  If not improved by next week then will need PEG tube and placement.    The patient is critically ill with multiple organ systems failure and requires high complexity decision making for assessment and support, frequent evaluation and titration of therapies, application of advanced monitoring technologies and extensive interpretation of multiple databases.   Critical Care Time devoted to patient care services described in this note is  35  Minutes. This  time reflects time of care of this signee Dr Jennet Maduro. This critical care time does not reflect procedure time, or teaching time or supervisory time of PA/NP/Med student/Med Resident etc but could involve care discussion time.  Rush Farmer, M.D. [facility] Pulmonary/Critical Care Medicine. Pager: [phone]. After hours pager: [phone].

## 2017-04-24 NOTE — Progress Notes (Signed)
eLink Physician-Brief Progress Note Patient Name: [name] DOB: 1975-10-20 MRN: [id number]   Date of Service  04/24/2017  HPI/Events of Note  Increased volume of liquid stools since starting tube feeds.   eICU Interventions  Will have flexiseal inserted.        Malin Cervini 04/24/2017, 10:36 PM

## 2017-04-25 ENCOUNTER — Inpatient Hospital Stay (HOSPITAL_COMMUNITY): Payer: Medicare Other

## 2017-04-25 DIAGNOSIS — E1129 Type 2 diabetes mellitus with other diabetic kidney complication: Secondary | ICD-10-CM | POA: Diagnosis not present

## 2017-04-25 DIAGNOSIS — Z992 Dependence on renal dialysis: Secondary | ICD-10-CM | POA: Diagnosis not present

## 2017-04-25 DIAGNOSIS — N186 End stage renal disease: Secondary | ICD-10-CM | POA: Diagnosis not present

## 2017-04-25 DIAGNOSIS — B829 Intestinal parasitism, unspecified: Secondary | ICD-10-CM

## 2017-04-25 LAB — CULTURE, BLOOD (ROUTINE X 2)
Culture: NO GROWTH
Special Requests: ADEQUATE

## 2017-04-25 LAB — PROCALCITONIN: Procalcitonin: 12.19 ng/mL

## 2017-04-25 LAB — GLUCOSE, CAPILLARY
GLUCOSE-CAPILLARY: 125 mg/dL — AB (ref 65–99)
GLUCOSE-CAPILLARY: 138 mg/dL — AB (ref 65–99)
GLUCOSE-CAPILLARY: 147 mg/dL — AB (ref 65–99)
GLUCOSE-CAPILLARY: 159 mg/dL — AB (ref 65–99)
GLUCOSE-CAPILLARY: 201 mg/dL — AB (ref 65–99)
Glucose-Capillary: 105 mg/dL — ABNORMAL HIGH (ref 65–99)
Glucose-Capillary: 130 mg/dL — ABNORMAL HIGH (ref 65–99)
Glucose-Capillary: 97 mg/dL (ref 65–99)
Glucose-Capillary: 208 mg/dL — ABNORMAL HIGH (ref 65–99)

## 2017-04-25 LAB — CBC WITH DIFFERENTIAL/PLATELET
BASOS ABS: 0 10*3/uL (ref 0.0–0.1)
BASOS PCT: 0 %
EOS ABS: 0.2 10*3/uL (ref 0.0–0.7)
Eosinophils Relative: 1 %
HEMATOCRIT: 26.6 % — AB (ref 36.0–46.0)
HEMOGLOBIN: 8.2 g/dL — AB (ref 12.0–15.0)
Lymphocytes Relative: 5 %
Lymphs Abs: 0.9 10*3/uL (ref 0.7–4.0)
MCH: 28.1 pg (ref 26.0–34.0)
MCHC: 30.8 g/dL (ref 30.0–36.0)
MCV: 91.1 fL (ref 78.0–100.0)
MONOS PCT: 10 %
Monocytes Absolute: 1.9 10*3/uL — ABNORMAL HIGH (ref 0.1–1.0)
NEUTROS ABS: 15.4 10*3/uL — AB (ref 1.7–7.7)
NEUTROS PCT: 84 %
Platelets: 281 10*3/uL (ref 150–400)
RBC: 2.92 MIL/uL — ABNORMAL LOW (ref 3.87–5.11)
RDW: 17.5 % — ABNORMAL HIGH (ref 11.5–15.5)
WBC: 18.4 10*3/uL — AB (ref 4.0–10.5)

## 2017-04-25 LAB — RENAL FUNCTION PANEL
ALBUMIN: 1.9 g/dL — AB (ref 3.5–5.0)
ANION GAP: 15 (ref 5–15)
BUN: 42 mg/dL — ABNORMAL HIGH (ref 6–20)
CALCIUM: 8.5 mg/dL — AB (ref 8.9–10.3)
CO2: 23 mmol/L (ref 22–32)
Chloride: 95 mmol/L — ABNORMAL LOW (ref 101–111)
Creatinine, Ser: 4.35 mg/dL — ABNORMAL HIGH (ref 0.44–1.00)
GFR, EST AFRICAN AMERICAN: 14 mL/min — AB (ref >60)
GFR, EST NON AFRICAN AMERICAN: 12 mL/min — AB (ref >60)
GLUCOSE: 107 mg/dL — AB (ref 65–99)
PHOSPHORUS: 7.4 mg/dL — AB (ref 2.5–4.6)
POTASSIUM: 3.8 mmol/L (ref 3.5–5.1)
SODIUM: 133 mmol/L — AB (ref 135–145)

## 2017-04-25 LAB — HEPATIC FUNCTION PANEL
ALT: 36 U/L (ref 14–54)
AST: 24 U/L (ref 15–41)
Albumin: 1.9 g/dL — ABNORMAL LOW (ref 3.5–5.0)
Alkaline Phosphatase: 255 U/L — ABNORMAL HIGH (ref 38–126)
BILIRUBIN DIRECT: 0.3 mg/dL (ref 0.1–0.5)
BILIRUBIN INDIRECT: 0.6 mg/dL (ref 0.3–0.9)
TOTAL PROTEIN: 5.8 g/dL — AB (ref 6.5–8.1)
Total Bilirubin: 0.9 mg/dL (ref 0.3–1.2)

## 2017-04-25 LAB — TRIGLYCERIDES: TRIGLYCERIDES: 372 mg/dL — AB (ref <150)

## 2017-04-25 MED ORDER — METOCLOPRAMIDE HCL 5 MG/5ML PO SOLN
5.0000 mg | Freq: Three times a day (TID) | ORAL | Status: DC
  Administered 2017-04-25 – 2017-04-27 (×3): 5 mg
  Filled 2017-04-25 (×14): qty 5

## 2017-04-25 MED ORDER — FENTANYL CITRATE (PF) 100 MCG/2ML IJ SOLN
25.0000 ug | INTRAMUSCULAR | Status: DC | PRN
  Filled 2017-04-25: qty 2

## 2017-04-25 MED ORDER — PRO-STAT SUGAR FREE PO LIQD
60.0000 mL | Freq: Four times a day (QID) | ORAL | Status: DC
  Administered 2017-04-25 – 2017-04-28 (×8): 60 mL
  Filled 2017-04-25 (×10): qty 60

## 2017-04-25 MED ORDER — RENA-VITE PO TABS
1.0000 | ORAL_TABLET | Freq: Every day | ORAL | Status: DC
  Administered 2017-04-25 – 2017-04-27 (×3): 1 via ORAL
  Filled 2017-04-25 (×4): qty 1

## 2017-04-25 MED ORDER — PROPOFOL 1000 MG/100ML IV EMUL
5.0000 ug/kg/min | INTRAVENOUS | Status: DC
  Administered 2017-04-25 (×2): 25 ug/kg/min via INTRAVENOUS
  Filled 2017-04-25: qty 100

## 2017-04-25 MED ORDER — VITAL 1.5 CAL PO LIQD
1000.0000 mL | ORAL | Status: DC
  Administered 2017-04-25 – 2017-04-28 (×3): 1000 mL
  Filled 2017-04-25 (×5): qty 1000

## 2017-04-25 NOTE — Progress Notes (Signed)
[facility] Progress Note  Subjective: no new problems, off the Cleviprex gtt since yest afternoon. BP's good, 140 - 180 sbp  Vitals:   04/25/17 1136 04/25/17 1145 04/25/17 1207 04/25/17 1215  BP: (!) 147/85 (!) 150/88  (!) 146/68  Pulse: 80 82  85  Resp: 13 (!) 21  14  Temp:   98.9 F (37.2 C)   TempSrc:   Oral   SpO2: 100% 93%  98%  Weight:      Height:        Inpatient medications: . acetylcysteine  4 mL Nebulization Q6H  . amLODipine  10 mg Oral Daily  . chlorhexidine gluconate (MEDLINE KIT)  15 mL Mouth Rinse BID  . cloNIDine  0.3 mg Per NG tube TID  . darbepoetin (ARANESP) injection - DIALYSIS  200 mcg Intravenous Q Wed-HD  . docusate  100 mg Per Tube BID  . feeding supplement (PRO-STAT SUGAR FREE 64)  60 mL Per Tube 5 X Daily  . fentaNYL (SUBLIMAZE) injection  200 mcg Intravenous Once  . heparin  5,000 Units Subcutaneous Q8H  . hydrALAZINE  100 mg Oral Q8H  . insulin aspart  2-6 Units Subcutaneous Q4H  . labetalol  300 mg Oral TID  . mouth rinse  15 mL Mouth Rinse 10 times per day  . methyldopa  500 mg Oral Daily  . metoCLOPramide  5 mg Per Tube Q8H  . pantoprazole sodium  40 mg Per Tube Daily  . sennosides  5 mL Per Tube BID  . spironolactone  100 mg Per NG tube Daily   . feeding supplement (VITAL HIGH PROTEIN) 1,000 mL (04/24/17 2000)  . fentaNYL infusion INTRAVENOUS 200 mcg/hr (04/25/17 1033)  . metronidazole Stopped (04/25/17 0720)  . vancomycin Stopped (04/23/17 2006)   acetaminophen (TYLENOL) oral liquid 160 mg/5 mL, bisacodyl, fentaNYL (SUBLIMAZE) injection, hydrALAZINE, ipratropium-albuterol, labetalol, midazolam, ondansetron (ZOFRAN) IV  Exam: +trach, on vent, awake and responsive No jvd, no edema Chest coarse BS RRR no mrg Abd distended, nontender, +BS Ext no edema L groin trialysis cath/ LUE AVF+bruit  Dialysis:  MWF  4h 67mn   89kg  Hep 2000  LFA AVF -hect 7 ug -mircera 225 due 11/28 -last Hb 9.3, Ca 8.7/ P 9, pth  228 -sensipar 60/ auryxia  -home BP Rx : norvasc 10/ clon 0.3 tid/ hydralazine 100 tid/ labetalol 300 tid/ aldomet 500 qd      Impression: 1. SP cardiac arrest - 11/21, CPR x 2 hrs. 2nd arrest on 11/23 2. VDRF -  CXR improved, some residual congestion 3. SP R fem artery endarterectomy  - 11/21 by VVS 4. HTN , severe at baseline - is on 5 BP meds at home and normal BP's are 1269 2485systolic.  Reportedly does not tolerate well SBP's below 140.  Have d/w Dr GEinar Gipher cardiologist. Pt has chronic and severe HTN. Cont permissive HTN, have resumed all 5 home meds per NG. BP's good today.  5. ESRD - sp CRRT, back on HD MWF. 6. Anemia of CKD - Hb 7-8 range. getting darbe max dose 200 ug / wk on Wed 7. Volume -slight under dry now, edema resolved. 8. DM - per primary   Plan - HD today, min UF    RKelly SplinterMD CCane Bedspager [phone]  04/25/2017, 12:25 PM   Recent Labs  Lab 04/23/17 0536 04/24/17 0423 04/25/17 0330  NA 134* 134* 133*  K 3.3* 3.8 3.8  CL 96* 94* 95*  CO2 21* 23 23  GLUCOSE 122* 150* 107*  BUN 32* 20 42*  CREATININE 4.81* 3.22* 4.35*  CALCIUM 8.5* 8.3* 8.5*  PHOS 5.7* 4.7* 7.4*   Recent Labs  Lab 04/22/17 0323  04/23/17 0536 04/24/17 0423 04/25/17 0330  AST 54*  --   --   --  24  ALT 94*  --   --   --  36  ALKPHOS 218*  --   --   --  255*  BILITOT 1.8*  --   --   --  0.9  PROT 6.3*  --   --   --  5.8*  ALBUMIN 2.2*   < > 1.9* 2.1* 1.9*  1.9*   < > = values in this interval not displayed.   Recent Labs  Lab 04/23/17 0500 04/24/17 0423 04/25/17 0330  WBC 8.3 13.9* 18.4*  NEUTROABS 6.4 12.0* 15.4*  HGB 7.3* 9.0* 8.2*  HCT 23.2* 28.0* 26.6*  MCV 91.3 89.2 91.1  PLT 250 290 281   Iron/TIBC/Ferritin/ %Sat    Component Value Date/Time   IRON 124 10/31/2014 0405   TIBC 157 (L) 10/31/2014 0405   FERRITIN 780 (H) 10/31/2014 0405   IRONPCTSAT 79 (H) 10/31/2014 0405

## 2017-04-25 NOTE — Progress Notes (Signed)
Dialysis treatment completed.  1799 mL ultrafiltrated and net fluid removal 599 mL.    Patient status unchanged. Lung sounds diminished to ausculation in all fields. BLE 1+ pitting edema. Cardiac: NSR.  Disconnected lines and removed needles.  Pressure held for 10 minutes and band aid/gauze dressing applied.  Report given to bedside RN, Colletta Maryland.

## 2017-04-25 NOTE — Progress Notes (Signed)
Nutrition Follow-up  DOCUMENTATION CODES:   Non-severe (moderate) malnutrition in context of chronic illness  INTERVENTION:   Tube Feeding: Change to Vital 1.5 @ 30 ml/hr with Pro-Stat 60 mL (2 packets) QID via Cortrak Provides 2060 kcals, 177 g of protein (2 g/kg) and 547 mL of free water Meets 106% calorie needs, 100% protein needs  Recommend addition of renal MVI; current TF does not meet DRIs/RDAs, ESRD on HD   Now that pt with multiple BMs, consider changing bowel regimen to PRN   New TF regimen provides 840 mg of phosphorus per day (does not exceed phosphorus recommendation of [phone] mg for HD pt or <17 g/kg BW (1486 mg per day); noted phosphorus elevated prior to receiving HD today, should improve post HD. Follow phosphorus trend, do not recommend switching to Nepro formula at this time, as Nepro would not meet the pt's needs as well as Vital 1.5 and will likely not be as well tolerated in pt with delayed gastric emptying and will like increase stool volume.   NUTRITION DIAGNOSIS:   Moderate Malnutrition related to chronic illness(ESRD and type 1 DM) as evidenced by mild fat depletion, moderate fat depletion, mild muscle depletion, moderate muscle depletion.  Being addressed via nutrition support  GOAL:   Patient will meet greater than or equal to 90% of their needs  Met  MONITOR:   Vent status, I & O's  REASON FOR ASSESSMENT:   Ventilator    ASSESSMENT:   Pt with PMH of type 1 DM, ESRD on HD, PVD admitted for elective management of PAD. While in OR for femoral endarterectomy she developed severe hypotension and metabolic acidosis, cardiac arrest with resuscitative efforts for 2 hrs. Pt transferred to ICU for CRRT, extubated. On 11/23 at 0600 pt with second PEA arrest requiring CPR for 30 min and re-intubated.   Patient remains on vent support via trach, not tolerated weaning thus far MV: 7.8 L/min Temp (24hrs), Avg:98.7 F (37.1 C), Min:97.1 F (36.2 C),  Max:99.5 F (37.5 C)  Tolerating Vital High Protein @ 10 ml/hr with Prostat 60 mL x 5 per day via Cortrak tube (location in 3rd or 4th portion of duodenum)  NG w ith 1750 mL in previous 24 hours. Noted reglan started today. No change in abdomen, BS hypoactive, abdomen distended (+ascites)  HD today  Propofol: discontinued Cleviprex: off (ordered but not infusing)   Labs: sodium 133, phosphorus 7.4 (HD today), potassium wdl Meds: reglan, senokot/colace scheduled   Diet Order:  Diet NPO time specified  EDUCATION NEEDS:   Not appropriate for education at this time  Skin:  Skin Assessment: Skin Integrity Issues: Skin Integrity Issues:: Stage II, Other (Comment) Stage II: coccyx (DTI that evolved into stage II) Other: L buttock partial thickness wound, degloving R toe, DM ulcer L toe  Last BM:  11/30  Height:   Ht Readings from Last 1 Encounters:  04/21/17 _0  (1.93 m)    Weight:   Wt Readings from Last 1 Encounters:  04/25/17 192 lb 10.9 oz (87.4 kg)    Ideal Body Weight:  81.8 kg  BMI:  Body mass index is 23.45 kg/m.  Estimated Nutritional Needs:   Kcal:  1938 kcals  Protein:  134-178 g (max 223 g per day)  Fluid:  1000 mL plus UOP  [employer] MS, RD, LDN, CNSC [phone] Pager  [phone] Weekend/On-Call Pager

## 2017-04-25 NOTE — Progress Notes (Signed)
Trach care done per RT. tolerated it well

## 2017-04-25 NOTE — Progress Notes (Addendum)
Nephrology notified of inability to hit net UFG while maintaining BP within prescribed parameters (systoic >140).  Will continue to monitor.

## 2017-04-25 NOTE — Progress Notes (Signed)
Pt seen by trach team for consult. No education needed at this time as pt remains vent dependant.  All necessary equipment available at bedside. Will continue to monitor for progress.

## 2017-04-25 NOTE — Progress Notes (Signed)
Arrived to patient room 2H-18 at 1025.  Reviewed treatment plan and this RN agrees.  Report received from bedside RN, Colletta Maryland.  Consent verified.  Patient Responds to voice. Lung sounds clear and diminished to ausculation in all fields. BLE 1+ pitting edema. Cardiac: NSR.  Prepped LLAVF with alcohol and cannulated with two 15 gauge needles.  Pulsation of blood noted.  Flushed access well with saline per protocol.  Connected and secured lines and initiated tx at 1043.  UF goal of 2000 mL and net fluid removal of 1500 mL.  Will continue to monitor.

## 2017-04-25 NOTE — Progress Notes (Signed)
Patient wean well on arrival. Good volumes and Minute ventilation  Pt is now back on FS at this time. Tolerating it well.

## 2017-04-25 NOTE — Progress Notes (Signed)
Subjective  -   Plans discussed for LTACH persistent drainage   Physical Exam:  From the JP in the right groin. Right foot is unchanged   Assessment/Plan:    Continue to monitor drainage from the right groin JP.  No signs of infection currently.  She does have a prosthetic patch in the groin and therefore I want to get the JP out as soon as possible however the volume output is too high currently.  I would be reluctant to let her go to a LTACH with her drain in place due to infection concerns.  We will monitor the output over the weekend.  If it remains high, she may need right groin exploration prior to discharge so that I can get her JP out.  If she does go to the operating room, I have spoken with Dr. 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  We will try to do this at the time of her groin exploration if needed, otherwise this could potentially be delayed until her medical condition improves.  Wells Brabham 04/25/2017 6:35 PM --  Vitals:   04/25/17 1645 04/25/17 1700  BP: 140/75 135/69  Pulse: 84 83  Resp: 15 15  Temp:    SpO2: 98% 97%    Intake/Output Summary (Last 24 hours) at 04/25/2017 1835 Last data filed at 04/25/2017 1800 Gross per 24 hour  Intake 1621.39 ml  Output 2939 ml  Net -1317.61 ml     Laboratory CBC    Component Value Date/Time   WBC 18.4 (H) 04/25/2017 0330   HGB 8.2 (L) 04/25/2017 0330   HCT 26.6 (L) 04/25/2017 0330   PLT 281 04/25/2017 0330    BMET    Component Value Date/Time   NA 133 (L) 04/25/2017 0330   K 3.8 04/25/2017 0330   CL 95 (L) 04/25/2017 0330   CO2 23 04/25/2017 0330   GLUCOSE 107 (H) 04/25/2017 0330   BUN 42 (H) 04/25/2017 0330   CREATININE 4.35 (H) 04/25/2017 0330   CALCIUM 8.5 (L) 04/25/2017 0330   GFRNONAA 12 (L) 04/25/2017 0330   GFRAA 14 (L) 04/25/2017 0330    COAG Lab Results  Component Value Date   INR 1.20 04/22/2017   INR 1.31 04/18/2017   INR 1.14 04/15/2017   No results found for:  PTT  Antibiotics Anti-infectives (From admission, onward)   Start     Dose/Rate Route Frequency Ordered Stop   04/23/17 0600  metroNIDAZOLE (FLAGYL) IVPB 500 mg     500 mg 100 mL/hr over 60 Minutes Intravenous Every 8 hours 04/22/17 2348 04/25/17 2359   04/21/17 1500  vancomycin (VANCOCIN) IVPB 1000 mg/200 mL premix     1,000 mg 200 mL/hr over 60 Minutes Intravenous Every M-W-F (Hemodialysis) 04/21/17 1404 04/25/17 2359   04/20/17 1130  vancomycin (VANCOCIN) 2,000 mg in sodium chloride 0.9 % 500 mL IVPB     2,000 mg 250 mL/hr over 120 Minutes Intravenous  Once 04/20/17 1043 04/20/17 1328   04/18/17 1700  levofloxacin (LEVAQUIN) IVPB 500 mg     500 mg 100 mL/hr over 60 Minutes Intravenous Every 48 hours 04/17/17 0933 04/24/17 1708   04/16/17 1700  levofloxacin (LEVAQUIN) IVPB 750 mg  Status:  Discontinued     750 mg 100 mL/hr over 90 Minutes Intravenous Every 48 hours 04/16/17 1544 04/17/17 0933   04/16/17 1530  metroNIDAZOLE (FLAGYL) IVPB 500 mg  Status:  Discontinued     500 mg 100 mL/hr over 60 Minutes Intravenous Every 8  hours 04/16/17 1512 04/22/17 2348   04/16/17 0636  vancomycin (VANCOCIN) 1-5 GM/200ML-% IVPB    Comments:  [name]   : cabinet override      04/16/17 0636 04/16/17 0820   04/16/17 0633  vancomycin (VANCOCIN) IVPB 1000 mg/200 mL premix     1,000 mg 200 mL/hr over 60 Minutes Intravenous 60 min pre-op 04/16/17 0633 04/16/17 0920       V. Leia Alf, M.D. [facility] Office: [phone] Pager:  [phone]

## 2017-04-25 NOTE — Progress Notes (Signed)
Conductivity malfunction with HD machine resulting in delay of tx for machine switch.  Tx time extended by approx. 51 minutes.  Will continue to monitor.

## 2017-04-25 NOTE — Progress Notes (Signed)
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  The patient was profoundly acidotic and once that was addressed patient had a stable rhythm and was brought to the ICU for CRRT to address profound metabolic acidosis.  Patient was unresponsive upon arrival but with stabilization of BP reportedly became responsive.  PCCM was consulted for vent and vital signs management. Cardiac arrest x2 and 48 hours suspected from profound acidotic state.  SUBJECTIVE/INTERVAL:   No acute issues. On HD this AM.  VITAL SIGNS: BP 125/68   Pulse 81   Temp 99.5 F (37.5 C) (Oral)   Resp 16   Ht 6\' 4"  (1.93 m)   Wt 87.4 kg (192 lb 10.9 oz)   LMP 10/09/2015 Comment: pt on dialysis  SpO2 100%   BMI 23.45 kg/m   HEMODYNAMICS:    VENTILATOR SETTINGS: Vent Mode: PRVC FiO2 (%):  [40 %-50 %] 40 % Set Rate:  [16 bmp] 16 bmp Vt Set:  [600 mL-660 mL] 660 mL PEEP:  [5 cmH20] 5 cmH20 Plateau Pressure:  [16 cmH20-23 cmH20] 16 cmH20  INTAKE / OUTPUT: I/O last 3 completed shifts: In: 4327.3 [I.V.:2267.3; NG/GT:1160; IV Piggyback:900] Out: 7610 [Emesis/NG output:2850; Drains:260; Other:4500]  PHYSICAL EXAMINATION:  General: adult female on vent in NAD. HEENT:[place]/AT, PERRL, no JVD. Neuro: Unable to awaken this morning (on sedation for HD as was agitated earlier per report) CV: RRR, no MRG. PULM: CTAB. GI: Soft, non-tender, non-distended. Extremities/skin: Bilateral lower extremities with ischemic changes, right foot worse than left.  LABS:  BMET Recent Labs  Lab 04/23/17 0536 04/24/17 0423  04/25/17 0330  NA 134* 134* 133*  K 3.3* 3.8 3.8  CL 96* 94* 95*  CO2 21* 23 23  BUN 32* 20 42*  CREATININE 4.81* 3.22* 4.35*  GLUCOSE 122* 150* 107*    Electrolytes Recent Labs  Lab 04/22/17 2322 04/23/17 0536 04/24/17 0423 04/25/17 0330  CALCIUM 8.6* 8.5* 8.3* 8.5*  MG 2.0 2.0 2.0  --   PHOS 5.6* 5.7* 4.7* 7.4*    CBC Recent Labs  Lab 04/23/17 0500 04/24/17 0423 04/25/17 0330  WBC 8.3 13.9* 18.4*  HGB 7.3* 9.0* 8.2*  HCT 23.2* 28.0* 26.6*  PLT 250 290 281    Coag's Recent Labs  Lab 04/22/17 1020  INR 1.20    Sepsis Markers Recent Labs  Lab 04/18/17 1610  04/21/17 0522 04/22/17 0323 04/25/17 0330  LATICACIDVEN 1.0  --   --   --   --   PROCALCITON  --    < > 22.87 18.61 12.19   < > = values in this interval not displayed.    ABG Recent Labs  Lab 04/18/17 0925 04/22/17 0352 04/24/17 0400  PHART 7.486* 7.514* 7.587*  PCO2ART 33.6 28.6* 25.6*  PO2ART 268* 69.0* 75.7*    Liver Enzymes Recent Labs  Lab 04/22/17 0323  04/23/17 0536 04/24/17 0423 04/25/17 0330  AST 54*  --   --   --  24  ALT 94*  --   --   --  36  ALKPHOS 218*  --   --   --  255*  BILITOT 1.8*  --   --   --  0.9  ALBUMIN 2.2*   < > 1.9* 2.1* 1.9*  1.9*   < > = values in this interval not displayed.    Cardiac Enzymes No results for input(s): TROPONINI, PROBNP in the last 168 hours.  Glucose Recent Labs  Lab 04/24/17 1637 04/24/17 2004 04/24/17 2114 04/24/17 2359 04/25/17 0348 04/25/17 0756  GLUCAP 111* 146* 147* 125* 105* 138*    Imaging Dg Abd 1 View  Result Date: 04/24/2017 CLINICAL DATA:  41 year old female with a history of nasogastric tube placement EXAM: ABDOMEN - 1 VIEW COMPARISON:  04/23/2017 FINDINGS: Gas within stomach small bowel and colon. Metallic tip enteric feeding tube terminates in the third/ fourth portion of the duodenum, unchanged from prior. Gastric tube terminates in the stomach. Vascular calcifications IMPRESSION: Unchanged position of  metallic tip feeding tube terminating in the third/ fourth portion the duodenum. Unchanged position of gastric tube, terminating in the stomach Electronically Signed   By: Corrie Mckusick D.O.   On: 04/24/2017 12:42    STUDIES:  Abdominal/chest CT 11/21>>> no evidence of pulmonary embolism.  Nondiagnostic CT of the abdomen and pelvis.  Probable hematoma in the right superficial femoral Artery.  Moderate to large volume this ascites Head CT 11/21>>> not done KUB 11/25 no evidence ileus or obstruction.  CT chest 11/26 >  Acute anterior right second through fifth and anterior left third through fifth rib fractures, many of which are displaced. Nondisplaced mid sternal fracture. Bilateral lower lobe airspace consolidations, atelectasis versus aspiration. Small pleural effusions. Suspect trace pulmonary edema. Aortic atherosclerosis with coronary artery disease.  CULTURES: Blood 11/21>>> Blood culture 04/19/2017>> Urine culture 04/20/2017 done via in and out cath with 30 cc obtained>> Sputum culture 04/20/2017>>  ANTIBIOTICS: Levaquin 11/21>>> 11/30 Flagyl 11/21>>> 11/30 Vancomycin 11/25/ >>> 11/30  SIGNIFICANT EVENTS:  11/21>>> Cardiac arrest in OR 04/17/2017 extubated 04/18/2017>> second cardiac arrest requiring intubation and 30 minutes of CPR 04/20/2017 in and out cath with 30 cc obtained 04/20/2017 no extubation at this time secondary to chest wall pain 11/27 trach  LINES/TUBES: ETT 11/21>>>11/22 reintubated 11/23 for cardiac arrest>> 11/27 L femoral HD catheter 11/21>>> 11/28 R radial a-line 11/21>>> 11/26 Trach 11/27 >   DISCUSSION: 41 year old type I diabetic with all the classic complications with cardiac arrest in the OR for 2 hours that is now following commands so no need for cooling.  Rapidly coming off pressors with addressing of acidosis.  Cause of arrest suspected to be acidosis after reperfusion in a patient with ESRD vs ischemic bowel.  Slow to wean due to chest pain but  wakes up and is neuro intact.   ASSESSMENT / PLAN:  PULMONARY A: ARDS and VDRF post CPR for 2 hours > improved Difficulty weaning due to chest wall pain - s/p trach 11/27 Mild pulmonary edema Resp alkalosis overnight 11/28 - rate adjusted  Multiple rib Fx many of which displaced P:   Full vent support, has not tolerated weaning thus far Continue brief periods of daily weaning trials with gradual progression as able Add mucomyst x 6 doses given recurrent plugging (added 11/29) Follow CXR  VAP bundle Curbside CVTS feel little benefit for rib plating at this point as she will likely require trach regardless.  CARDIOVASCULAR A:  Circulatory Shock, unknown cause, resolved after resolution of acidosis HTN Arrest x2 P:  Continue clevidipine infusion for SBP goal 160-170 EKG monitoring Cardiology following  RENAL A:  ESRD - started on CRRT Metabolic acidosis - resolved Hyponatremia - suspect hypervolemic Hypokalemia - resolved P:   Nephrology following for HD (M/W/F) Follow BMP  GASTROINTESTINAL A:   Nutrition Ileus? Transaminitis - Abdominal CT is done 04/18/2018 which demonstrated probable hematoma in the right superficial femoral artery.  Also notes a large to moderate volume of ascites. KUB 11/25 with no evidence ileus or obstruction. P:   Continue TF (overnight was held due to nausea and large residuals; now resolved) Might need PEG as anticipate prolonged wean from trach / vent support - appears more and more likely Continue bowel regimen PPI  HEMATOLOGIC A:   Mild anemia P:  Transfuse for Hgb < 7 Follow CBC  INFECTIOUS A:   Concern for ischemic bowel or intraabdominal infection - s/p 10 days abx as of 11/30 (levaquin, flagyl, vanc) Occult sepsis concern bacteremia, intraabdominal, PCT elevated P:   Will stop abx today 11/30 and monitor clinically Follow cultures through completion Limit tylenol with bump in AST/ALT   ENDOCRINE A:   DM   P:    SSI CBG  NEUROLOGIC A:   Acute encephalopathy - with sedation for mechanical ventilation P:   RASS goal: 0 to -1 Fentanyl infusion / Propofol infusion Pain control as needed.  Her sternum is extremely tender from previous CPR x 2  FAMILY  - Updates: No family available  - Inter-disciplinary family meet or Palliative Care meeting due by:  day 7  CC time: 30 min.   Montey Hora, [place] [facility] Pager: [phone]  or [phone] 04/25/2017, 9:14 AM   Attending Note:  41 year old female with type one diabetes presenting to PCCM post femoral endarterectomy and cardiac arrest.  Patient has multiple broken ribs and a flail chest on chest CT that I reviewed myself.  On exam, unable to wean this AM due to pain.  Will d/c all sedation today short of fentanyl for pain control.  Will continue weaning efforts.  Add reglan for diabetic gastroperesis and continue TF.  Hold in the ICU for vent support.  The patient is critically ill with multiple organ systems failure and requires high complexity decision making for assessment and support, frequent evaluation and titration of therapies, application of advanced monitoring technologies and extensive interpretation of multiple databases.   Critical Care Time devoted to patient care services described in this note is  35  Minutes. This time reflects time of care of this signee Dr Jennet Maduro. This critical care time does not reflect procedure time, or teaching time or supervisory time of PA/NP/Med student/Med Resident etc but could involve care discussion time.  Rush Farmer, M.D. [facility] Pulmonary/Critical Care Medicine. Pager: [phone]. After hours pager: [phone].

## 2017-04-25 NOTE — Care Management Note (Signed)
Case Management Note Marvetta Gibbons RN, BSN Unit 4E-Case Manager-- [place] coverage [phone]  Patient Details  Name: [name] MRN: [id number] Date of Birth: 03/04/1976  Subjective/Objective:   Pt admitted  s/p: Right femoral endarterectomy with intraoperative cardiac arrest 11/21- to ICU intubated and CRRT, then had PEA arrest 04-22-2023 am and was coded and re-intubated                  Action/Plan: PTA pt lived at home with spouse- CM to follow for d/c needs.   Expected Discharge Date:                  Expected Discharge Plan:  Long Term Acute Care (LTAC)  In-House Referral:     Discharge planning Services  CM Consult  Post Acute Care Choice:  Home Health, Long Term Acute Care (LTAC) Choice offered to:  Spouse, Patient  DME Arranged:    DME Agency:     HH Arranged:    [place] Agency:     Status of Service:  In process, will continue to follow  If discussed at Long Length of Stay Meetings, dates discussed:    Discharge Disposition:   Additional Comments:  04/25/17- 1600- Marvetta Gibbons RN, CM- pt now trached- remains on Vent- attempting to wean- per QC mtg recommendation for possible LTACH- spoke with Lennie Muckle vascular PA- regarding LTACH referral- verbal order given - CM spoke with pt's spouse/pt and aunt at bedside regarding transition of care plan- discussed LTACH option when pt medically stable for tx to next phase of care- choice offered for [facility] or [facility]- per spouse he would prefer [facility] as first choice but would consider [facility] if bed is not available for [facility] at time when pt ready for tx. - Referrals called to both [facility] and [facility] for review and will look at pt first of next week for possible readiness and bed availability.    Dawayne Patricia, RN 04/25/2017, 4:30 PM

## 2017-04-26 ENCOUNTER — Inpatient Hospital Stay (HOSPITAL_COMMUNITY): Payer: Medicare Other

## 2017-04-26 LAB — RENAL FUNCTION PANEL
ALBUMIN: 1.8 g/dL — AB (ref 3.5–5.0)
ANION GAP: 12 (ref 5–15)
BUN: 29 mg/dL — ABNORMAL HIGH (ref 6–20)
CALCIUM: 8.3 mg/dL — AB (ref 8.9–10.3)
CO2: 26 mmol/L (ref 22–32)
Chloride: 98 mmol/L — ABNORMAL LOW (ref 101–111)
Creatinine, Ser: 3.21 mg/dL — ABNORMAL HIGH (ref 0.44–1.00)
GFR, EST AFRICAN AMERICAN: 20 mL/min — AB (ref >60)
GFR, EST NON AFRICAN AMERICAN: 17 mL/min — AB (ref >60)
Glucose, Bld: 197 mg/dL — ABNORMAL HIGH (ref 65–99)
PHOSPHORUS: 5.1 mg/dL — AB (ref 2.5–4.6)
POTASSIUM: 3.6 mmol/L (ref 3.5–5.1)
SODIUM: 136 mmol/L (ref 135–145)

## 2017-04-26 LAB — CBC WITH DIFFERENTIAL/PLATELET
BASOS ABS: 0 10*3/uL (ref 0.0–0.1)
BASOS PCT: 0 %
Eosinophils Absolute: 0.2 10*3/uL (ref 0.0–0.7)
Eosinophils Relative: 1 %
HEMATOCRIT: 26.6 % — AB (ref 36.0–46.0)
HEMOGLOBIN: 8.2 g/dL — AB (ref 12.0–15.0)
Lymphocytes Relative: 6 %
Lymphs Abs: 0.8 10*3/uL (ref 0.7–4.0)
MCH: 28.3 pg (ref 26.0–34.0)
MCHC: 30.8 g/dL (ref 30.0–36.0)
MCV: 91.7 fL (ref 78.0–100.0)
MONOS PCT: 12 %
Monocytes Absolute: 1.8 10*3/uL — ABNORMAL HIGH (ref 0.1–1.0)
NEUTROS ABS: 12.2 10*3/uL — AB (ref 1.7–7.7)
NEUTROS PCT: 81 %
Platelets: 333 10*3/uL (ref 150–400)
RBC: 2.9 MIL/uL — AB (ref 3.87–5.11)
RDW: 17.5 % — ABNORMAL HIGH (ref 11.5–15.5)
WBC: 14.9 10*3/uL — AB (ref 4.0–10.5)

## 2017-04-26 LAB — BASIC METABOLIC PANEL
Anion gap: 14 (ref 5–15)
BUN: 28 mg/dL — AB (ref 6–20)
CALCIUM: 8.3 mg/dL — AB (ref 8.9–10.3)
CO2: 24 mmol/L (ref 22–32)
CREATININE: 3.16 mg/dL — AB (ref 0.44–1.00)
Chloride: 97 mmol/L — ABNORMAL LOW (ref 101–111)
GFR calc non Af Amer: 17 mL/min — ABNORMAL LOW (ref >60)
GFR, EST AFRICAN AMERICAN: 20 mL/min — AB (ref >60)
Glucose, Bld: 197 mg/dL — ABNORMAL HIGH (ref 65–99)
Potassium: 3.6 mmol/L (ref 3.5–5.1)
SODIUM: 135 mmol/L (ref 135–145)

## 2017-04-26 LAB — GLUCOSE, CAPILLARY
GLUCOSE-CAPILLARY: 195 mg/dL — AB (ref 65–99)
GLUCOSE-CAPILLARY: 212 mg/dL — AB (ref 65–99)
GLUCOSE-CAPILLARY: 204 mg/dL — AB (ref 65–99)
Glucose-Capillary: 226 mg/dL — ABNORMAL HIGH (ref 65–99)
Glucose-Capillary: 156 mg/dL — ABNORMAL HIGH (ref 65–99)

## 2017-04-26 LAB — MAGNESIUM: MAGNESIUM: 1.9 mg/dL (ref 1.7–2.4)

## 2017-04-26 LAB — PHOSPHORUS: Phosphorus: 5 mg/dL — ABNORMAL HIGH (ref 2.5–4.6)

## 2017-04-26 MED ORDER — CAMPHOR-MENTHOL 0.5-0.5 % EX LOTN
TOPICAL_LOTION | CUTANEOUS | Status: DC | PRN
  Filled 2017-04-26: qty 222

## 2017-04-26 NOTE — Progress Notes (Signed)
  Progress Note    04/26/2017 9:04 AM 10 Days Post-Op  Subjective:  No acute issues  Vitals:   04/26/17 0700 04/26/17 0812  BP: (!) 166/77 (!) 164/84  Pulse: 80 81  Resp: 16 16  Temp:    SpO2: 99% 94%    Physical Exam: Awake and alert Right groin incision is cdi Drain with serous drainage and full, >100cc over last 24 hours dp signal Stable gangrenous changes to right toes  CBC    Component Value Date/Time   WBC 14.9 (H) 04/26/2017 0233   RBC 2.90 (L) 04/26/2017 0233   HGB 8.2 (L) 04/26/2017 0233   HCT 26.6 (L) 04/26/2017 0233   PLT 333 04/26/2017 0233   MCV 91.7 04/26/2017 0233   MCH 28.3 04/26/2017 0233   MCHC 30.8 04/26/2017 0233   RDW 17.5 (H) 04/26/2017 0233   LYMPHSABS 0.8 04/26/2017 0233   MONOABS 1.8 (H) 04/26/2017 0233   EOSABS 0.2 04/26/2017 0233   BASOSABS 0.0 04/26/2017 0233    BMET    Component Value Date/Time   NA 136 04/26/2017 0233   NA 135 04/26/2017 0233   K 3.6 04/26/2017 0233   K 3.6 04/26/2017 0233   CL 98 (L) 04/26/2017 0233   CL 97 (L) 04/26/2017 0233   CO2 26 04/26/2017 0233   CO2 24 04/26/2017 0233   GLUCOSE 197 (H) 04/26/2017 0233   GLUCOSE 197 (H) 04/26/2017 0233   BUN 29 (H) 04/26/2017 0233   BUN 28 (H) 04/26/2017 0233   CREATININE 3.21 (H) 04/26/2017 0233   CREATININE 3.16 (H) 04/26/2017 0233   CALCIUM 8.3 (L) 04/26/2017 0233   CALCIUM 8.3 (L) 04/26/2017 0233   GFRNONAA 17 (L) 04/26/2017 0233   GFRNONAA 17 (L) 04/26/2017 0233   GFRAA 20 (L) 04/26/2017 0233   GFRAA 20 (L) 04/26/2017 0233    INR    Component Value Date/Time   INR 1.20 04/22/2017 1020     Intake/Output Summary (Last 24 hours) at 04/26/2017 0904 Last data filed at 04/26/2017 0700 Gross per 24 hour  Intake 1788.05 ml  Output 3344 ml  Net -1555.95 ml     Assessment:  41 y.o. female is s/p Right CFEA  Plan: Continue icu care with tube feeding and vent management-will need ltac Hopefully can get drain out in next couple days prior to dc given  infection risk Antibiotics were discontinued yesterday  Teeghan Hammer C. Donzetta Matters, MD [facility] Office: [phone] Pager: [phone]  04/26/2017 9:04 AM

## 2017-04-26 NOTE — Progress Notes (Signed)
[facility] Progress Note  Subjective:   Vitals:   04/26/17 0800 04/26/17 0812 04/26/17 0900 04/26/17 1000  BP: (!) 170/78 (!) 164/84 (!) 163/71 (!) 150/64  Pulse: 81 81 88 84  Resp: _0 Temp:   99.5 F (37.5 C)   TempSrc:   Oral   SpO2: 100% 94% 97% 100%  Weight:      Height:        Inpatient medications: . acetylcysteine  4 mL Nebulization Q6H  . amLODipine  10 mg Oral Daily  . chlorhexidine gluconate (MEDLINE KIT)  15 mL Mouth Rinse BID  . cloNIDine  0.3 mg Per NG tube TID  . darbepoetin (ARANESP) injection - DIALYSIS  200 mcg Intravenous Q Wed-HD  . docusate  100 mg Per Tube BID  . feeding supplement (PRO-STAT SUGAR FREE 64)  60 mL Per Tube QID  . feeding supplement (VITAL 1.5 CAL)  1,000 mL Per Tube Q24H  . fentaNYL (SUBLIMAZE) injection  200 mcg Intravenous Once  . heparin  5,000 Units Subcutaneous Q8H  . hydrALAZINE  100 mg Oral Q8H  . insulin aspart  2-6 Units Subcutaneous Q4H  . labetalol  300 mg Oral TID  . mouth rinse  15 mL Mouth Rinse 10 times per day  . methyldopa  500 mg Oral Daily  . metoCLOPramide  5 mg Per Tube Q8H  . multivitamin  1 tablet Oral QHS  . pantoprazole sodium  40 mg Per Tube Daily  . sennosides  5 mL Per Tube BID  . spironolactone  100 mg Per NG tube Daily   . fentaNYL infusion INTRAVENOUS 200 mcg/hr (04/26/17 1015)   acetaminophen (TYLENOL) oral liquid 160 mg/5 mL, bisacodyl, camphor-menthol, fentaNYL (SUBLIMAZE) injection, hydrALAZINE, ipratropium-albuterol, labetalol, midazolam, ondansetron (ZOFRAN) IV  Exam: +trach, on vent, awake and responsive No jvd, no edema Chest coarse BS RRR no mrg Abd distended, nontender, +BS Ext no edema L groin trialysis cath/ LUE AVF+bruit  Dialysis:  MWF  4h 44mn   89kg  Hep 2000  LFA AVF -hect 7 ug -mircera 225 due 11/28 -last Hb 9.3, Ca 8.7/ P 9, pth 228 -sensipar 60/ auryxia  -home BP Rx : norvasc 10/ clon 0.3 tid/ hydralazine 100 tid/ labetalol 300 tid/ aldomet 500  qd      Impression: 1. SP cardiac arrest - 11/21, CPR x 2 hrs. 2nd arrest on 11/23 2. VDRF -  CXR stable vasc congestion 3. SP R fem artery endarterectomy  - 11/21 by VVS 4. HTN , severe at baseline, normal BP's are 1514 2604systolic.  Reportedly does not tolerate well SBP's below 140. Cont permissive HTN, cont home BP meds. 5. ESRD - sp CRRT, back on HD MWF. 6. Anemia of CKD - Hb 8- 9 range. getting darbe max dose 200 ug / wk on Wed 7. Volume -slight under dry now, edema resolved. 8. DM - per primary   Plan - HD Monday   RKelly SplinterMD CIberiapager [phone]  04/26/2017, 11:05 AM   Recent Labs  Lab 04/24/17 0423 04/25/17 0330 04/26/17 0233  NA 134* 133* 135  136  K 3.8 3.8 3.6  3.6  CL 94* 95* 97*  98*  CO2 _1 GLUCOSE 150* 107* 197*  197*  BUN 20 42* 28*  29*  CREATININE 3.22* 4.35* 3.16*  3.21*  CALCIUM 8.3* 8.5* 8.3*  8.3*  PHOS 4.7* 7.4* 5.0*  5.1*   Recent  Labs  Lab 04/22/17 0323  04/24/17 0423 04/25/17 0330 04/26/17 0233  AST 54*  --   --  24  --   ALT 94*  --   --  36  --   ALKPHOS 218*  --   --  255*  --   BILITOT 1.8*  --   --  0.9  --   PROT 6.3*  --   --  5.8*  --   ALBUMIN 2.2*   < > 2.1* 1.9*  1.9* 1.8*   < > = values in this interval not displayed.   Recent Labs  Lab 04/24/17 0423 04/25/17 0330 04/26/17 0233  WBC 13.9* 18.4* 14.9*  NEUTROABS 12.0* 15.4* 12.2*  HGB 9.0* 8.2* 8.2*  HCT 28.0* 26.6* 26.6*  MCV 89.2 91.1 91.7  PLT 290 281 333   Iron/TIBC/Ferritin/ %Sat    Component Value Date/Time   IRON 124 10/31/2014 0405   TIBC 157 (L) 10/31/2014 0405   FERRITIN 780 (H) 10/31/2014 0405   IRONPCTSAT 79 (H) 10/31/2014 0405

## 2017-04-26 NOTE — Progress Notes (Signed)
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  The patient was profoundly acidotic and once that was addressed patient had a stable rhythm and was brought to the ICU for CRRT to address profound metabolic acidosis.  Patient was unresponsive upon arrival but with stabilization of BP reportedly became responsive.  PCCM was consulted for vent and vital signs management. Cardiac arrest x2 and 48 hours suspected from profound acidotic state.  SUBJECTIVE/INTERVAL:   Alert this am, f/c.  Remains on high dose fentanyl for ongoing pain-rib fx.  Weaning this am .doing well.  High TF residuals with NGT to snx    VITAL SIGNS: BP (!) 164/84   Pulse 81   Temp 98.8 F (37.1 C) (Oral)   Resp 16   Ht 6\' 4"  (1.93 m)   Wt 187 lb 2.7 oz (84.9 kg)   LMP 10/09/2015 Comment: pt on dialysis  SpO2 94%   BMI 22.78 kg/m   HEMODYNAMICS:    VENTILATOR SETTINGS: Vent Mode: PSV;CPAP FiO2 (%):  [40 %] 40 % Set Rate:  [16 bmp] 16 bmp Vt Set:  [660 mL] 660 mL PEEP:  [5 cmH20] 5 cmH20 Pressure Support:  [12 cmH20] 12 cmH20 Plateau Pressure:  [18 cmH20-20 cmH20] 19 cmH20  INTAKE / OUTPUT: I/O last 3 completed shifts: In: 3056.7 [I.V.:1291.4; NG/GT:1265.3; IV Piggyback:500] Out: 0932 [Emesis/NG output:2550; Drains:215; Other:599; IZTIW:5809]  PHYSICAL EXAMINATION:  General: female on Vent , calm  HEENT: MM  Neuro: alert , calm, fc.  CV: RRR, no m/r/g  PULM: CTA , no wheezing  GI: soft , BS + , NGT to sxn.  Extremities/skin: Bilateral lower ext w/ ischemic changes, R>L .    LABS:  BMET Recent Labs  Lab 04/24/17 0423 04/25/17 0330 04/26/17 0233  NA 134* 133* 135  136  K 3.8 3.8 3.6  3.6  CL 94* 95* 97*  98*  CO2 23 23 24  26   BUN 20 42* 28*  29*  CREATININE 3.22* 4.35* 3.16*  3.21*  GLUCOSE 150* 107* 197*  197*    Electrolytes Recent Labs  Lab 04/23/17 0536 04/24/17 0423 04/25/17 0330 04/26/17 0233  CALCIUM 8.5* 8.3* 8.5* 8.3*  8.3*  MG 2.0 2.0  --  1.9  PHOS 5.7* 4.7* 7.4* 5.0*  5.1*    CBC Recent Labs  Lab 04/24/17 0423 04/25/17 0330 04/26/17 0233  WBC 13.9* 18.4* 14.9*  HGB 9.0* 8.2* 8.2*  HCT 28.0* 26.6* 26.6*  PLT 290 281 333    Coag's Recent Labs  Lab 04/22/17 1020  INR 1.20    Sepsis Markers Recent Labs  Lab 04/21/17 0522 04/22/17 0323 04/25/17 0330  PROCALCITON 22.87 18.61 12.19    ABG Recent Labs  Lab 04/22/17 0352 04/24/17 0400  PHART 7.514* 7.587*  PCO2ART 28.6* 25.6*  PO2ART 69.0* 75.7*    Liver Enzymes Recent Labs  Lab 04/22/17 0323  04/24/17 0423 04/25/17 0330 04/26/17 0233  AST 54*  --   --  24  --   ALT 94*  --   --  36  --   ALKPHOS 218*  --   --  255*  --   BILITOT 1.8*  --   --  0.9  --   ALBUMIN 2.2*   < > 2.1* 1.9*  1.9* 1.8*   < > = values in this interval not displayed.    Cardiac Enzymes No results for input(s): TROPONINI, PROBNP in the last 168 hours.  Glucose Recent Labs  Lab 04/25/17 1505 04/25/17 1548 04/25/17 2000 04/25/17 2351 04/26/17 0256 04/26/17 0833  GLUCAP 97 159* 208* 201* 195* 226*    Imaging Dg Chest Port 1 View  Result Date: 04/26/2017 CLINICAL DATA:  Respiratory failure EXAM: PORTABLE CHEST 1 VIEW COMPARISON:  04/25/2017 FINDINGS: Tracheostomy and feeding tube remain in place. Cardiomegaly. Bilateral lower lobe airspace opacities are again noted. No visible effusions. Mild vascular congestion. IMPRESSION: Cardiomegaly, vascular congestion. Continued bibasilar atelectasis or infiltrates. Electronically Signed   By: Rolm Baptise M.D.    On: 04/26/2017 07:50    STUDIES:  Abdominal/chest CT 11/21>>> no evidence of pulmonary embolism.  Nondiagnostic CT of the abdomen and pelvis.  Probable hematoma in the right superficial femoral Artery.  Moderate to large volume this ascites Head CT 11/21>>> not done KUB 11/25 no evidence ileus or obstruction.  CT chest 11/26 >  Acute anterior right second through fifth and anterior left third through fifth rib fractures, many of which are displaced. Nondisplaced mid sternal fracture. Bilateral lower lobe airspace consolidations, atelectasis versus aspiration. Small pleural effusions. Suspect trace pulmonary edema. Aortic atherosclerosis with coronary artery disease.  CULTURES: Blood 11/21>>>NEG  Blood culture 04/19/2017>>NEG  Urine culture 04/20/2017 done via in and out cath with 30 cc obtained>>NEG  Sputum culture 04/20/2017>>NML flora   ANTIBIOTICS: Levaquin 11/21>>> 11/30 Flagyl 11/21>>> 11/30 Vancomycin 11/25/ >>> 11/30  SIGNIFICANT EVENTS:  11/21>>> Cardiac arrest in OR 04/17/2017 extubated 04/18/2017>> second cardiac arrest requiring intubation and 30 minutes of CPR 04/20/2017 in and out cath with 30 cc obtained 04/20/2017 no extubation at this time secondary to chest wall pain 11/27 trach  LINES/TUBES: ETT 11/21>>>11/22 reintubated 11/23 for cardiac arrest>> 11/27 L femoral HD catheter 11/21>>> 11/28 R radial a-line 11/21>>> 11/26 Trach 11/27 >   DISCUSSION: 41 year old type I diabetic with all the classic complications with cardiac arrest in the OR for 2 hours that is now following commands so no need for cooling.  Rapidly coming off pressors with addressing of acidosis.  Cause of arrest suspected to be acidosis after reperfusion in a patient with ESRD vs ischemic bowel.  Slow to wean due to chest pain but wakes up and is neuro intact.   ASSESSMENT / PLAN:  PULMONARY A: ARDS and VDRF post CPR for 2 hours > improved Difficulty weaning due to chest wall pain - s/p  trach 11/27 Mild pulmonary edema Resp alkalosis overnight 11/28 - rate adjusted  Multiple rib Fx many of which displaced (Curbside CVTS feel little benefit for rib plating)    P:   Continue to wean as tolerated.  Follow CXR  VAP bundle   CARDIOVASCULAR A:  Circulatory Shock, unknown cause, resolved after resolution of acidosis HTN- off Cleviprex 11/29 . Becomes sx with sbp <140.  Arrest x2 S/p R fem artery endartectomy 11/21  P:  Continue norvasc , catapress, hydralazine, labetolol and aldatone.  Cont tele  Cardiology following  RENAL A:   ESRD - s/p CRRT . HD on M/W/F  Metabolic acidosis - resolved Hyponatremia - suspect hypervolemic Hypokalemia - resolved P:   Nephrology following for HD (M/W/F) Follow BMP  GASTROINTESTINAL A:  Nutrition Ileus? Transaminitis - Abdominal CT is done 04/18/2018 which demonstrated probable hematoma in the right superficial femoral artery.  Also notes a large to moderate volume of ascites. KUB 11/25 with no evidence ileus or obstruction. P:   Hold TF w/ high residual, large OG output .  Might need PEG as anticipate prolonged wean from trach / vent support - appears more and more likely Continue bowel regimen PPI reglan added 11/30.  Check ABD film .   HEMATOLOGIC A:   Mild anemia P:  Transfuse for Hgb < 7 Follow CBC  INFECTIOUS A:   Concern for ischemic bowel or intraabdominal infection - s/p 10 days abx as of 11/30 (levaquin, flagyl, vanc)-  Occult sepsis concern bacteremia, intraabdominal, PCT elevated P:    Follow cultures through completion Limit tylenol with bump in AST/ALT   ENDOCRINE A:   DM   P:   SSI CBG  NEUROLOGIC A:   Acute encephalopathy - with sedation for mechanical ventilation P:   RASS goal: 0 to -1 Fentanyl infusion  Pain control as needed.  Her sternum is extremely tender from previous CPR x 2  FAMILY  - Updates: No family available  - Inter-disciplinary family meet or Palliative Care  meeting due by:  day 7     Destenee Guerry NP-C  [facility]  [phone]   04/26/2017, 8:58 AM

## 2017-04-27 ENCOUNTER — Inpatient Hospital Stay (HOSPITAL_COMMUNITY): Payer: Medicare Other

## 2017-04-27 LAB — CBC WITH DIFFERENTIAL/PLATELET
BASOS PCT: 0 %
Basophils Absolute: 0 10*3/uL (ref 0.0–0.1)
EOS ABS: 0.3 10*3/uL (ref 0.0–0.7)
Eosinophils Relative: 3 %
HEMATOCRIT: 27.1 % — AB (ref 36.0–46.0)
Hemoglobin: 8.1 g/dL — ABNORMAL LOW (ref 12.0–15.0)
LYMPHS ABS: 1.7 10*3/uL (ref 0.7–4.0)
Lymphocytes Relative: 14 %
MCH: 27.5 pg (ref 26.0–34.0)
MCHC: 29.9 g/dL — ABNORMAL LOW (ref 30.0–36.0)
MCV: 91.9 fL (ref 78.0–100.0)
MONO ABS: 0.6 10*3/uL (ref 0.1–1.0)
MONOS PCT: 5 %
Neutro Abs: 9.2 10*3/uL — ABNORMAL HIGH (ref 1.7–7.7)
Neutrophils Relative %: 78 %
Platelets: 348 10*3/uL (ref 150–400)
RBC: 2.95 MIL/uL — ABNORMAL LOW (ref 3.87–5.11)
RDW: 17.4 % — AB (ref 11.5–15.5)
WBC: 11.8 10*3/uL — ABNORMAL HIGH (ref 4.0–10.5)

## 2017-04-27 LAB — GLUCOSE, CAPILLARY
GLUCOSE-CAPILLARY: 139 mg/dL — AB (ref 65–99)
GLUCOSE-CAPILLARY: 156 mg/dL — AB (ref 65–99)
GLUCOSE-CAPILLARY: 255 mg/dL — AB (ref 65–99)
GLUCOSE-CAPILLARY: 264 mg/dL — AB (ref 65–99)
GLUCOSE-CAPILLARY: 189 mg/dL — AB (ref 65–99)
Glucose-Capillary: 144 mg/dL — ABNORMAL HIGH (ref 65–99)
Glucose-Capillary: 147 mg/dL — ABNORMAL HIGH (ref 65–99)

## 2017-04-27 LAB — RENAL FUNCTION PANEL
ALBUMIN: 2 g/dL — AB (ref 3.5–5.0)
ANION GAP: 14 (ref 5–15)
BUN: 40 mg/dL — AB (ref 6–20)
CO2: 25 mmol/L (ref 22–32)
Calcium: 8.8 mg/dL — ABNORMAL LOW (ref 8.9–10.3)
Chloride: 96 mmol/L — ABNORMAL LOW (ref 101–111)
Creatinine, Ser: 4.42 mg/dL — ABNORMAL HIGH (ref 0.44–1.00)
GFR calc Af Amer: 13 mL/min — ABNORMAL LOW (ref >60)
GFR calc non Af Amer: 11 mL/min — ABNORMAL LOW (ref >60)
GLUCOSE: 144 mg/dL — AB (ref 65–99)
PHOSPHORUS: 6 mg/dL — AB (ref 2.5–4.6)
POTASSIUM: 3.5 mmol/L (ref 3.5–5.1)
SODIUM: 135 mmol/L (ref 135–145)

## 2017-04-27 MED ORDER — INSULIN GLARGINE 100 UNIT/ML ~~LOC~~ SOLN
5.0000 [IU] | Freq: Every day | SUBCUTANEOUS | Status: DC
  Administered 2017-04-27: 5 [IU] via SUBCUTANEOUS
  Filled 2017-04-27 (×2): qty 0.05

## 2017-04-27 MED ORDER — INSULIN ASPART 100 UNIT/ML ~~LOC~~ SOLN: 4.0000 [IU] | Freq: Once | SUBCUTANEOUS | Status: DC

## 2017-04-27 MED ORDER — HYDRALAZINE HCL 50 MG PO TABS
100.0000 mg | ORAL_TABLET | Freq: Three times a day (TID) | ORAL | Status: DC
  Administered 2017-04-27 (×2): 100 mg via ORAL
  Filled 2017-04-27 (×2): qty 2

## 2017-04-27 MED ORDER — INSULIN ASPART 100 UNIT/ML ~~LOC~~ SOLN
0.0000 [IU] | SUBCUTANEOUS | Status: DC
Start: 2017-04-27 — End: 2017-04-28
  Administered 2017-04-27: 8 [IU] via SUBCUTANEOUS
  Administered 2017-04-28: 3 [IU] via SUBCUTANEOUS
  Administered 2017-04-28: 2 [IU] via SUBCUTANEOUS
  Administered 2017-04-28 (×2): 3 [IU] via SUBCUTANEOUS
  Administered 2017-04-28: 5 [IU] via SUBCUTANEOUS

## 2017-04-27 MED ORDER — LABETALOL HCL 5 MG/ML IV SOLN: 10.0000 mg | INTRAVENOUS | Status: DC | PRN

## 2017-04-27 NOTE — Progress Notes (Addendum)
[facility] Progress Note  Subjective: responds well, BP's good, weights stable 84kg  Vitals:   04/27/17 0800 04/27/17 0807 04/27/17 0900 04/27/17 1000  BP: (!) 151/71 (!) 151/71 (!) 148/66 (!) 146/73  Pulse: 81 82 84 83  Resp: '16 18 19 17  ' Temp:      TempSrc:      SpO2: 98% 98% 99% 99%  Weight:      Height:        Inpatient medications: . amLODipine  10 mg Oral Daily  . chlorhexidine gluconate (MEDLINE KIT)  15 mL Mouth Rinse BID  . cloNIDine  0.3 mg Per NG tube TID  . darbepoetin (ARANESP) injection - DIALYSIS  200 mcg Intravenous Q Wed-HD  . docusate  100 mg Per Tube BID  . feeding supplement (PRO-STAT SUGAR FREE 64)  60 mL Per Tube QID  . feeding supplement (VITAL 1.5 CAL)  1,000 mL Per Tube Q24H  . fentaNYL (SUBLIMAZE) injection  200 mcg Intravenous Once  . heparin  5,000 Units Subcutaneous Q8H  . hydrALAZINE  100 mg Oral Q8H  . insulin aspart  2-6 Units Subcutaneous Q4H  . labetalol  300 mg Oral TID  . mouth rinse  15 mL Mouth Rinse 10 times per day  . methyldopa  500 mg Oral Daily  . metoCLOPramide  5 mg Per Tube Q8H  . multivitamin  1 tablet Oral QHS  . pantoprazole sodium  40 mg Per Tube Daily  . sennosides  5 mL Per Tube BID  . spironolactone  100 mg Per NG tube Daily   . fentaNYL infusion INTRAVENOUS 75 mcg/hr (04/27/17 0500)   acetaminophen (TYLENOL) oral liquid 160 mg/5 mL, bisacodyl, camphor-menthol, fentaNYL (SUBLIMAZE) injection, ipratropium-albuterol, labetalol, midazolam, ondansetron (ZOFRAN) IV  Exam: +trach, on vent, awake and responsive No jvd, no edema Chest coarse BS RRR no mrg Abd distended, nontender, +BS Ext no edema LUE AVF+bruit  Dialysis:  MWF  4h 21mn   89kg  Hep 2000  LFA AVF -hect 7 ug -mircera 225 due 11/28 -last Hb 9.3, Ca 8.7/ P 9, pth 228 -sensipar 60/ auryxia  -home BP Rx : norvasc 10/ clon 0.3 tid/ hydralazine 100 tid/ labetalol 300 tid/ aldomet 500 qd  CXR 12/1 - vasc congestion, stable      Impression: 1. SP cardiac arrest - 11/21, CPR x 2 hrs. 2nd arrest on 11/23 2. SP R fem artery endarterectomy  - 11/21 by VVS 3. ESRD - sp CRRT, back on HD MWF. 4. HTN , severe at baseline, normal BP's are 1258 2527systolic.  Reportedly does not tolerate well SBP's below 140. Cont permissive HTN, cont home BP meds. 5. Volume - 4 kg under dry wt, euvolemic on exam 6. VDRF - sp trach, will need ltac 7. Anemia of CKD - Hb 8- 9 range. getting darbe max dose 200 ug / wk 8. DM - per primary   Plan - HD Monday, min UF   RKelly SplinterMD [facility] pager [phone]  04/27/2017, 10:58 AM   Recent Labs  Lab 04/25/17 0330 04/26/17 0233 04/27/17 0258  NA 133* 135  136 135  K 3.8 3.6  3.6 3.5  CL 95* 97*  98* 96*  CO2 '23 24  26 25  ' GLUCOSE 107* 197*  197* 144*  BUN 42* 28*  29* 40*  CREATININE 4.35* 3.16*  3.21* 4.42*  CALCIUM 8.5* 8.3*  8.3* 8.8*  PHOS 7.4* 5.0*  5.1* 6.0*   Recent Labs  Lab 04/22/17 0323  04/25/17 0330 04/26/17 0233 04/27/17 0258  AST 54*  --  24  --   --   ALT 94*  --  36  --   --   ALKPHOS 218*  --  255*  --   --   BILITOT 1.8*  --  0.9  --   --   PROT 6.3*  --  5.8*  --   --   ALBUMIN 2.2*   < > 1.9*  1.9* 1.8* 2.0*   < > = values in this interval not displayed.   Recent Labs  Lab 04/25/17 0330 04/26/17 0233 04/27/17 0258  WBC 18.4* 14.9* 11.8*  NEUTROABS 15.4* 12.2* 9.2*  HGB 8.2* 8.2* 8.1*  HCT 26.6* 26.6* 27.1*  MCV 91.1 91.7 91.9  PLT 281 333 348   Iron/TIBC/Ferritin/ %Sat    Component Value Date/Time   IRON 124 10/31/2014 0405   TIBC 157 (L) 10/31/2014 0405   FERRITIN 780 (H) 10/31/2014 0405   IRONPCTSAT 79 (H) 10/31/2014 0405

## 2017-04-27 NOTE — Progress Notes (Signed)
  Progress Note    04/27/2017 9:43 AM 11 Days Post-Op  Subjective:  No issues overnight  Vitals:   04/27/17 0800 04/27/17 0807  BP: (!) 151/71 (!) 151/71  Pulse: 81 82  Resp: 16 18  Temp:    SpO2: 98% 98%    Physical Exam: Awake and alert Right groin incision is cdi dp signal is strong Stable gangrenous changes to right toes  Drain: 50cc serous fluid   CBC    Component Value Date/Time   WBC 11.8 (H) 04/27/2017 0258   RBC 2.95 (L) 04/27/2017 0258   HGB 8.1 (L) 04/27/2017 0258   HCT 27.1 (L) 04/27/2017 0258   PLT 348 04/27/2017 0258   MCV 91.9 04/27/2017 0258   MCH 27.5 04/27/2017 0258   MCHC 29.9 (L) 04/27/2017 0258   RDW 17.4 (H) 04/27/2017 0258   LYMPHSABS 1.7 04/27/2017 0258   MONOABS 0.6 04/27/2017 0258   EOSABS 0.3 04/27/2017 0258   BASOSABS 0.0 04/27/2017 0258    BMET    Component Value Date/Time   NA 135 04/27/2017 0258   K 3.5 04/27/2017 0258   CL 96 (L) 04/27/2017 0258   CO2 25 04/27/2017 0258   GLUCOSE 144 (H) 04/27/2017 0258   BUN 40 (H) 04/27/2017 0258   CREATININE 4.42 (H) 04/27/2017 0258   CALCIUM 8.8 (L) 04/27/2017 0258   GFRNONAA 11 (L) 04/27/2017 0258   GFRAA 13 (L) 04/27/2017 0258    INR    Component Value Date/Time   INR 1.20 04/22/2017 1020     Intake/Output Summary (Last 24 hours) at 04/27/2017 0943 Last data filed at 04/27/2017 0800 Gross per 24 hour  Intake 980.17 ml  Output 1130 ml  Net -149.83 ml     Assessment:  41 y.o. female is s/p Right CFEA  Plan: Continue icu care and vent management-will need ltac. Will need to stay until drain can be removed. Will discuss need for abx with Dr. Trula Slade.    Erlene Quan C. Donzetta Matters, MD [facility] Office: [phone] Pager: [phone]  04/27/2017 9:43 AM

## 2017-04-27 NOTE — Progress Notes (Signed)
Pt placed back on full vent support due to increased RR & agitation. Pt tolerating well at this time.

## 2017-04-27 NOTE — Progress Notes (Signed)
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  The patient was profoundly acidotic and once that was addressed patient had a stable rhythm and was brought to the ICU for CRRT to address profound metabolic acidosis.  Patient was unresponsive upon arrival but with stabilization of BP reportedly became responsive.  PCCM was consulted for vent and vital signs management. Cardiac arrest x2 and 48 hours suspected from profound acidotic state.  SUBJECTIVE/INTERVAL:   Alert , f/c  Weaning this am . Weaned 12hr yesterday .  High TF residuals improving . , 1v ABD w/ resolving ileus .  Improved pain control/decreasing Fentanyl .   VITAL SIGNS: BP (!) 151/71   Pulse 82   Temp 98.9 F (37.2 C) (Oral)   Resp 18   Ht 6\' 4"  (1.93 m)   Wt 186 lb 4.6 oz (84.5 kg)   LMP 10/09/2015 Comment: pt on dialysis  SpO2 98%   BMI 22.68 kg/m   HEMODYNAMICS:    VENTILATOR SETTINGS: Vent Mode: PSV;CPAP FiO2 (%):  [40 %] 40 % Set Rate:  [16 bmp] 16 bmp Vt Set:  [660 mL] 660 mL PEEP:  [5 cmH20] 5 cmH20 Pressure Support:  [12 cmH20] 12 cmH20 Plateau Pressure:  [15 cmH20-20 cmH20] 15 cmH20  INTAKE / OUTPUT: I/O last 3 completed shifts: In: 2040.2 [I.V.:740.2; NG/GT:1300] Out: 2325 [Emesis/NG output:1600; Drains:175; Stool:550]  PHYSICAL EXAMINATION:  General: female on Vent , calm  HEENT: MM  Neuro: alert , calm, fc.  CV: RRR, no m/r/g  PULM: CTA , no wheezing  GI: soft , BS + , NGT to sxn.  Extremities/skin: Bilateral lower ext w/ ischemic changes, R>L .   LABS:  BMET Recent Labs  Lab  04/25/17 0330 04/26/17 0233 04/27/17 0258  NA 133* 135  136 135  K 3.8 3.6  3.6 3.5  CL 95* 97*  98* 96*  CO2 23 24  26 25   BUN 42* 28*  29* 40*  CREATININE 4.35* 3.16*  3.21* 4.42*  GLUCOSE 107* 197*  197* 144*    Electrolytes Recent Labs  Lab 04/23/17 0536 04/24/17 0423 04/25/17 0330 04/26/17 0233 04/27/17 0258  CALCIUM 8.5* 8.3* 8.5* 8.3*  8.3* 8.8*  MG 2.0 2.0  --  1.9  --   PHOS 5.7* 4.7* 7.4* 5.0*  5.1* 6.0*    CBC Recent Labs  Lab 04/25/17 0330 04/26/17 0233 04/27/17 0258  WBC 18.4* 14.9* 11.8*  HGB 8.2* 8.2* 8.1*  HCT 26.6* 26.6* 27.1*  PLT 281 333 348    Coag's Recent Labs  Lab 04/22/17 1020  INR 1.20    Sepsis Markers Recent Labs  Lab 04/21/17 0522 04/22/17 0323 04/25/17 0330  PROCALCITON 22.87 18.61 12.19    ABG Recent Labs  Lab 04/22/17 0352 04/24/17 0400  PHART 7.514* 7.587*  PCO2ART 28.6* 25.6*  PO2ART 69.0* 75.7*    Liver Enzymes Recent Labs  Lab 04/22/17 0323  04/25/17 0330 04/26/17 0233 04/27/17 0258  AST 54*  --  24  --   --   ALT 94*  --  36  --   --  ALKPHOS 218*  --  255*  --   --   BILITOT 1.8*  --  0.9  --   --   ALBUMIN 2.2*   < > 1.9*  1.9* 1.8* 2.0*   < > = values in this interval not displayed.    Cardiac Enzymes No results for input(s): TROPONINI, PROBNP in the last 168 hours.  Glucose Recent Labs  Lab 04/26/17 1153 04/26/17 1606 04/26/17 2028 04/26/17 2359 04/27/17 0351 04/27/17 0753  GLUCAP 212* 204* 156* 147* 139* 156*    Imaging Dg Abd Portable 1v  Result Date: 04/26/2017 CLINICAL DATA:  Ileus, feeding tube EXAM: PORTABLE ABDOMEN - 1 VIEW COMPARISON:  04/24/2017 FINDINGS: Feeding tube tip in the distal duodenum, unchanged. There is less gaseous distention of the bowel than 04/24/2017. No significant obstruction pattern or ileus. Abdominal and pelvic atherosclerosis noted. IMPRESSION: Less gas distention of bowel, compatible with resolving ileus. Stable feeding tube.  Electronically Signed   By: Jerilynn Mages.  Shick M.D.   On: 04/26/2017 11:10    STUDIES:  Abdominal/chest CT 11/21>>> no evidence of pulmonary embolism.  Nondiagnostic CT of the abdomen and pelvis.  Probable hematoma in the right superficial femoral Artery.  Moderate to large volume this ascites Head CT 11/21>>> not done KUB 11/25 no evidence ileus or obstruction.  CT chest 11/26 >  Acute anterior right second through fifth and anterior left third through fifth rib fractures, many of which are displaced. Nondisplaced mid sternal fracture. Bilateral lower lobe airspace consolidations, atelectasis versus aspiration. Small pleural effusions. Suspect trace pulmonary edema. Aortic atherosclerosis with coronary artery disease.  CULTURES: Blood 11/21>>>NEG  Blood culture 04/19/2017>>NEG  Urine culture 04/20/2017 done via in and out cath with 30 cc obtained>>NEG  Sputum culture 04/20/2017>>NML flora   ANTIBIOTICS: Levaquin 11/21>>> 11/30 Flagyl 11/21>>> 11/30 Vancomycin 11/25/ >>> 11/30  SIGNIFICANT EVENTS:  11/21>>> Cardiac arrest in OR 04/17/2017 extubated 04/18/2017>> second cardiac arrest requiring intubation and 30 minutes of CPR 04/20/2017 in and out cath with 30 cc obtained 04/20/2017 no extubation at this time secondary to chest wall pain 11/27 trach  LINES/TUBES: ETT 11/21>>>11/22 reintubated 11/23 for cardiac arrest>> 11/27 L femoral HD catheter 11/21>>> 11/28 R radial a-line 11/21>>> 11/26 Trach 11/27 >   DISCUSSION: 41 year old type I diabetic with all the classic complications with cardiac arrest in the OR for 2 hours that is now following commands so no need for cooling.  Rapidly coming off pressors with addressing of acidosis.  Cause of arrest suspected to be acidosis after reperfusion in a patient with ESRD vs ischemic bowel.  Slow to wean due to chest pain but wakes up and is neuro intact.   ASSESSMENT / PLAN:  PULMONARY A: ARDS and VDRF post CPR for 2 hours >  improved Difficulty weaning due to chest wall pain - s/p trach 11/27 Mild pulmonary edema Resp alkalosis overnight 11/28 - rate adjusted  Multiple rib Fx many of which displaced (Curbside CVTS feel little benefit for rib plating)    P:   Continue to wean as tolerated.  Follow CXR  VAP bundle   CARDIOVASCULAR A:  Circulatory Shock, unknown cause, resolved after resolution of acidosis HTN- off Cleviprex 11/29 . Becomes sx with sbp <140.  Arrest x2 S/p R fem artery endartectomy 11/21  P:  Continue norvasc , catapress, hydralazine, labetolol and aldatone.  Cont tele  Cardiology following  RENAL A:   ESRD - s/p CRRT . HD on M/W/F  Metabolic acidosis - resolved Hyponatremia -  suspect hypervolemic Hypokalemia - resolved P:   Nephrology following for HD (M/W/F) Follow BMP  GASTROINTESTINAL A:   Nutrition Ileus? Transaminitis - Abdominal CT is done 04/18/2018 which demonstrated probable hematoma in the right superficial femoral artery.  Also notes a large to moderate volume of ascites. KUB 11/25 with no evidence ileus or obstruction.. KUB on 12/1 w/ resolving ileus.  P:   Restart TF trickle feed, hold for high residual .  Might need PEG as anticipate prolonged wean from trach / vent support - appears more and more likely Continue bowel regimen PPI reglan added 11/30.    HEMATOLOGIC A:   Mild anemia P:  Transfuse for Hgb < 7 Follow CBC  INFECTIOUS A:   Concern for ischemic bowel or intraabdominal infection - s/p 10 days abx as of 11/30 (levaquin, flagyl, vanc)-  Occult sepsis concern bacteremia, intraabdominal, PCT elevated P:    Follow cultures through completion Limit tylenol with bump in AST/ALT   ENDOCRINE A:   DM   P:   SSI CBG  NEUROLOGIC A:   Acute encephalopathy - with sedation for mechanical ventilation P:   RASS goal: 0 to -1 Fentanyl infusion  Pain control as needed.  Her sternum is extremely tender from previous CPR x 2  FAMILY  -  Updates: No family available  - Inter-disciplinary family meet or Palliative Care meeting due by:  day 7     Tammy Parrett NP-C  [facility]  [phone]   04/27/2017, 8:58 AM

## 2017-04-28 ENCOUNTER — Inpatient Hospital Stay
Admission: RE | Admit: 2017-04-28 | Discharge: 2017-06-10 | Disposition: A | Discharge status: Skilled Nursing Facility | Payer: Medicare Other | Source: Other Acute Inpatient Hospital | Attending: Internal Medicine | Admitting: Internal Medicine

## 2017-04-28 ENCOUNTER — Other Ambulatory Visit (HOSPITAL_COMMUNITY): Payer: Medicare Other

## 2017-04-28 DIAGNOSIS — E1151 Type 2 diabetes mellitus with diabetic peripheral angiopathy without gangrene: Secondary | ICD-10-CM | POA: Diagnosis not present

## 2017-04-28 DIAGNOSIS — I10 Essential (primary) hypertension: Secondary | ICD-10-CM | POA: Diagnosis not present

## 2017-04-28 DIAGNOSIS — G40909 Epilepsy, unspecified, not intractable, without status epilepticus: Secondary | ICD-10-CM | POA: Diagnosis present

## 2017-04-28 DIAGNOSIS — Z48812 Encounter for surgical aftercare following surgery on the circulatory system: Secondary | ICD-10-CM | POA: Diagnosis not present

## 2017-04-28 DIAGNOSIS — Z430 Encounter for attention to tracheostomy: Secondary | ICD-10-CM | POA: Diagnosis not present

## 2017-04-28 DIAGNOSIS — E43 Unspecified severe protein-calorie malnutrition: Secondary | ICD-10-CM | POA: Diagnosis not present

## 2017-04-28 DIAGNOSIS — D499 Neoplasm of unspecified behavior of unspecified site: Secondary | ICD-10-CM

## 2017-04-28 DIAGNOSIS — G8929 Other chronic pain: Secondary | ICD-10-CM | POA: Diagnosis not present

## 2017-04-28 DIAGNOSIS — I2583 Coronary atherosclerosis due to lipid rich plaque: Secondary | ICD-10-CM | POA: Diagnosis not present

## 2017-04-28 DIAGNOSIS — L97519 Non-pressure chronic ulcer of other part of right foot with unspecified severity: Secondary | ICD-10-CM | POA: Diagnosis not present

## 2017-04-28 DIAGNOSIS — I2601 Septic pulmonary embolism with acute cor pulmonale: Secondary | ICD-10-CM

## 2017-04-28 DIAGNOSIS — E1122 Type 2 diabetes mellitus with diabetic chronic kidney disease: Secondary | ICD-10-CM | POA: Diagnosis not present

## 2017-04-28 DIAGNOSIS — S2249XA Multiple fractures of ribs, unspecified side, initial encounter for closed fracture: Secondary | ICD-10-CM

## 2017-04-28 DIAGNOSIS — M4628 Osteomyelitis of vertebra, sacral and sacrococcygeal region: Secondary | ICD-10-CM | POA: Diagnosis not present

## 2017-04-28 DIAGNOSIS — Z89431 Acquired absence of right foot: Secondary | ICD-10-CM | POA: Diagnosis not present

## 2017-04-28 DIAGNOSIS — I132 Hypertensive heart and chronic kidney disease with heart failure and with stage 5 chronic kidney disease, or end stage renal disease: Secondary | ICD-10-CM | POA: Diagnosis present

## 2017-04-28 DIAGNOSIS — I2510 Atherosclerotic heart disease of native coronary artery without angina pectoris: Secondary | ICD-10-CM | POA: Diagnosis not present

## 2017-04-28 DIAGNOSIS — R58 Hemorrhage, not elsewhere classified: Secondary | ICD-10-CM | POA: Diagnosis not present

## 2017-04-28 DIAGNOSIS — Z992 Dependence on renal dialysis: Secondary | ICD-10-CM | POA: Diagnosis not present

## 2017-04-28 DIAGNOSIS — R109 Unspecified abdominal pain: Secondary | ICD-10-CM | POA: Diagnosis not present

## 2017-04-28 DIAGNOSIS — Z4589 Encounter for adjustment and management of other implanted devices: Secondary | ICD-10-CM | POA: Diagnosis not present

## 2017-04-28 DIAGNOSIS — E1051 Type 1 diabetes mellitus with diabetic peripheral angiopathy without gangrene: Secondary | ICD-10-CM | POA: Diagnosis present

## 2017-04-28 DIAGNOSIS — J9621 Acute and chronic respiratory failure with hypoxia: Secondary | ICD-10-CM | POA: Diagnosis present

## 2017-04-28 DIAGNOSIS — I5030 Unspecified diastolic (congestive) heart failure: Secondary | ICD-10-CM | POA: Diagnosis present

## 2017-04-28 DIAGNOSIS — E871 Hypo-osmolality and hyponatremia: Secondary | ICD-10-CM | POA: Diagnosis not present

## 2017-04-28 DIAGNOSIS — L89154 Pressure ulcer of sacral region, stage 4: Secondary | ICD-10-CM | POA: Diagnosis not present

## 2017-04-28 DIAGNOSIS — I509 Heart failure, unspecified: Secondary | ICD-10-CM | POA: Diagnosis not present

## 2017-04-28 DIAGNOSIS — E1140 Type 2 diabetes mellitus with diabetic neuropathy, unspecified: Secondary | ICD-10-CM | POA: Diagnosis not present

## 2017-04-28 DIAGNOSIS — N186 End stage renal disease: Secondary | ICD-10-CM | POA: Diagnosis present

## 2017-04-28 DIAGNOSIS — R1310 Dysphagia, unspecified: Secondary | ICD-10-CM | POA: Diagnosis present

## 2017-04-28 DIAGNOSIS — N2581 Secondary hyperparathyroidism of renal origin: Secondary | ICD-10-CM | POA: Diagnosis present

## 2017-04-28 DIAGNOSIS — I120 Hypertensive chronic kidney disease with stage 5 chronic kidney disease or end stage renal disease: Secondary | ICD-10-CM | POA: Diagnosis not present

## 2017-04-28 DIAGNOSIS — R269 Unspecified abnormalities of gait and mobility: Secondary | ICD-10-CM | POA: Diagnosis not present

## 2017-04-28 DIAGNOSIS — Z4659 Encounter for fitting and adjustment of other gastrointestinal appliance and device: Secondary | ICD-10-CM

## 2017-04-28 DIAGNOSIS — E1022 Type 1 diabetes mellitus with diabetic chronic kidney disease: Secondary | ICD-10-CM | POA: Diagnosis present

## 2017-04-28 DIAGNOSIS — I739 Peripheral vascular disease, unspecified: Secondary | ICD-10-CM | POA: Diagnosis not present

## 2017-04-28 DIAGNOSIS — J9690 Respiratory failure, unspecified, unspecified whether with hypoxia or hypercapnia: Secondary | ICD-10-CM | POA: Diagnosis not present

## 2017-04-28 DIAGNOSIS — I2782 Chronic pulmonary embolism: Secondary | ICD-10-CM

## 2017-04-28 DIAGNOSIS — R188 Other ascites: Secondary | ICD-10-CM

## 2017-04-28 DIAGNOSIS — I517 Cardiomegaly: Secondary | ICD-10-CM | POA: Diagnosis not present

## 2017-04-28 DIAGNOSIS — Z4781 Encounter for orthopedic aftercare following surgical amputation: Secondary | ICD-10-CM | POA: Diagnosis not present

## 2017-04-28 DIAGNOSIS — J9600 Acute respiratory failure, unspecified whether with hypoxia or hypercapnia: Secondary | ICD-10-CM | POA: Diagnosis not present

## 2017-04-28 DIAGNOSIS — I96 Gangrene, not elsewhere classified: Secondary | ICD-10-CM | POA: Diagnosis not present

## 2017-04-28 DIAGNOSIS — R488 Other symbolic dysfunctions: Secondary | ICD-10-CM | POA: Diagnosis not present

## 2017-04-28 DIAGNOSIS — Z8674 Personal history of sudden cardiac arrest: Secondary | ICD-10-CM | POA: Diagnosis not present

## 2017-04-28 DIAGNOSIS — N179 Acute kidney failure, unspecified: Secondary | ICD-10-CM | POA: Diagnosis not present

## 2017-04-28 DIAGNOSIS — Z9911 Dependence on respirator [ventilator] status: Secondary | ICD-10-CM | POA: Diagnosis not present

## 2017-04-28 DIAGNOSIS — D631 Anemia in chronic kidney disease: Secondary | ICD-10-CM | POA: Diagnosis present

## 2017-04-28 DIAGNOSIS — K567 Ileus, unspecified: Secondary | ICD-10-CM | POA: Diagnosis present

## 2017-04-28 DIAGNOSIS — L89150 Pressure ulcer of sacral region, unstageable: Secondary | ICD-10-CM | POA: Diagnosis present

## 2017-04-28 DIAGNOSIS — Z9289 Personal history of other medical treatment: Secondary | ICD-10-CM | POA: Diagnosis not present

## 2017-04-28 DIAGNOSIS — F419 Anxiety disorder, unspecified: Secondary | ICD-10-CM | POA: Diagnosis present

## 2017-04-28 DIAGNOSIS — M6281 Muscle weakness (generalized): Secondary | ICD-10-CM | POA: Diagnosis not present

## 2017-04-28 DIAGNOSIS — Z48817 Encounter for surgical aftercare following surgery on the skin and subcutaneous tissue: Secondary | ICD-10-CM | POA: Diagnosis not present

## 2017-04-28 DIAGNOSIS — G894 Chronic pain syndrome: Secondary | ICD-10-CM | POA: Diagnosis present

## 2017-04-28 DIAGNOSIS — R143 Flatulence: Secondary | ICD-10-CM | POA: Diagnosis not present

## 2017-04-28 DIAGNOSIS — D62 Acute posthemorrhagic anemia: Secondary | ICD-10-CM | POA: Diagnosis present

## 2017-04-28 DIAGNOSIS — E1152 Type 2 diabetes mellitus with diabetic peripheral angiopathy with gangrene: Secondary | ICD-10-CM | POA: Diagnosis not present

## 2017-04-28 DIAGNOSIS — N185 Chronic kidney disease, stage 5: Secondary | ICD-10-CM | POA: Diagnosis not present

## 2017-04-28 DIAGNOSIS — S2249XD Multiple fractures of ribs, unspecified side, subsequent encounter for fracture with routine healing: Secondary | ICD-10-CM | POA: Diagnosis not present

## 2017-04-28 DIAGNOSIS — E46 Unspecified protein-calorie malnutrition: Secondary | ICD-10-CM | POA: Diagnosis present

## 2017-04-28 DIAGNOSIS — E875 Hyperkalemia: Secondary | ICD-10-CM | POA: Diagnosis not present

## 2017-04-28 DIAGNOSIS — Z6821 Body mass index (BMI) 21.0-21.9, adult: Secondary | ICD-10-CM | POA: Diagnosis not present

## 2017-04-28 LAB — POCT I-STAT 3, ART BLOOD GAS (G3+)
ACID-BASE EXCESS: 1 mmol/L (ref 0.0–2.0)
BICARBONATE: 26.5 mmol/L (ref 20.0–28.0)
O2 Saturation: 91 %
PO2 ART: 63 mmHg — AB (ref 83.0–108.0)
TCO2: 28 mmol/L (ref 22–32)
pCO2 arterial: 45.1 mmHg (ref 32.0–48.0)
pH, Arterial: 7.377 (ref 7.350–7.450)

## 2017-04-28 LAB — CBC WITH DIFFERENTIAL/PLATELET
Basophils Absolute: 0 10*3/uL (ref 0.0–0.1)
Basophils Relative: 0 %
EOS PCT: 2 %
Eosinophils Absolute: 0.3 10*3/uL (ref 0.0–0.7)
HCT: 26.6 % — ABNORMAL LOW (ref 36.0–46.0)
HEMOGLOBIN: 8.1 g/dL — AB (ref 12.0–15.0)
LYMPHS ABS: 0.9 10*3/uL (ref 0.7–4.0)
LYMPHS PCT: 8 %
MCH: 28.2 pg (ref 26.0–34.0)
MCHC: 30.5 g/dL (ref 30.0–36.0)
MCV: 92.7 fL (ref 78.0–100.0)
MONOS PCT: 14 %
Monocytes Absolute: 1.6 10*3/uL — ABNORMAL HIGH (ref 0.1–1.0)
NEUTROS PCT: 76 %
Neutro Abs: 8.8 10*3/uL — ABNORMAL HIGH (ref 1.7–7.7)
Platelets: 351 10*3/uL (ref 150–400)
RBC: 2.87 MIL/uL — AB (ref 3.87–5.11)
RDW: 17.7 % — ABNORMAL HIGH (ref 11.5–15.5)
WBC: 11.5 10*3/uL — AB (ref 4.0–10.5)

## 2017-04-28 LAB — GLUCOSE, CAPILLARY
GLUCOSE-CAPILLARY: 153 mg/dL — AB (ref 65–99)
GLUCOSE-CAPILLARY: 188 mg/dL — AB (ref 65–99)
GLUCOSE-CAPILLARY: 200 mg/dL — AB (ref 65–99)
GLUCOSE-CAPILLARY: 240 mg/dL — AB (ref 65–99)
Glucose-Capillary: 136 mg/dL — ABNORMAL HIGH (ref 65–99)

## 2017-04-28 LAB — RENAL FUNCTION PANEL
ANION GAP: 15 (ref 5–15)
Albumin: 2 g/dL — ABNORMAL LOW (ref 3.5–5.0)
BUN: 59 mg/dL — ABNORMAL HIGH (ref 6–20)
CHLORIDE: 97 mmol/L — AB (ref 101–111)
CO2: 24 mmol/L (ref 22–32)
Calcium: 8.8 mg/dL — ABNORMAL LOW (ref 8.9–10.3)
Creatinine, Ser: 5.43 mg/dL — ABNORMAL HIGH (ref 0.44–1.00)
GFR calc non Af Amer: 9 mL/min — ABNORMAL LOW (ref >60)
GFR, EST AFRICAN AMERICAN: 10 mL/min — AB (ref >60)
GLUCOSE: 244 mg/dL — AB (ref 65–99)
Phosphorus: 7 mg/dL — ABNORMAL HIGH (ref 2.5–4.6)
Potassium: 3.4 mmol/L — ABNORMAL LOW (ref 3.5–5.1)
Sodium: 136 mmol/L (ref 135–145)

## 2017-04-28 LAB — TRIGLYCERIDES: Triglycerides: 405 mg/dL — ABNORMAL HIGH (ref <150)

## 2017-04-28 MED ORDER — CHLORHEXIDINE GLUCONATE 0.12 % MT SOLN
OROMUCOSAL | Status: AC
  Administered 2017-04-28: 15 mL
  Filled 2017-04-28: qty 15

## 2017-04-28 MED ORDER — FENTANYL 25 MCG/HR TD PT72
75.0000 ug | MEDICATED_PATCH | TRANSDERMAL | Status: DC
  Administered 2017-04-28: 75 ug via TRANSDERMAL
  Filled 2017-04-28: qty 3

## 2017-04-28 MED ORDER — ATORVASTATIN CALCIUM 40 MG PO TABS
40.0000 mg | ORAL_TABLET | Freq: Every day | ORAL | Status: DC
  Administered 2017-04-28: 40 mg via ORAL
  Filled 2017-04-28: qty 1

## 2017-04-28 MED ORDER — POTASSIUM CHLORIDE 20 MEQ/15ML (10%) PO SOLN: 40.0000 meq | Freq: Once | ORAL | Status: DC

## 2017-04-28 MED ORDER — INSULIN GLARGINE 100 UNIT/ML ~~LOC~~ SOLN: 10.0000 [IU] | Freq: Every day | SUBCUTANEOUS | Status: DC

## 2017-04-28 MED ORDER — INSULIN GLARGINE 100 UNIT/ML ~~LOC~~ SOLN
10.0000 [IU] | Freq: Every day | SUBCUTANEOUS | Status: DC
  Administered 2017-04-28: 10 [IU] via SUBCUTANEOUS
  Filled 2017-04-28: qty 0.1

## 2017-04-28 MED ORDER — FENTANYL CITRATE (PF) 100 MCG/2ML IJ SOLN: 25.0000 ug | INTRAMUSCULAR | Status: DC | PRN

## 2017-04-28 NOTE — Discharge Summary (Signed)
AAA Discharge Summary    [name] 10-13-1975 41 y.o. female  [id number]  Admission Date: 04/16/2017  Discharge Date: 04/28/17  Physician: Janet Mitchell, MD  Admission Diagnosis: Peripheral vascular disease  Discharge Day services:    Physical Exam: Vitals:   04/28/17 1315 04/28/17 1332  BP: (!) 179/87 (!) 186/87  Pulse: 72 75  Resp:  16  Temp:  98.6 F (37 C)  SpO2:  100%   See progress note 04/28/17  Hospital Course:  The patient was admitted to the hospital and taken to the operating room on 04/16/2017 and underwent: R CFA, profunda, SFA, and EIA endarterectomy with patch angioplasty by Dr. Trula [name]. Endovascular revascularization was also indicated, before this portion of the procedure could be accomplished, the patient essentially had circulatory collapse.  When she did not respond to conservative measures, A TEE probe was brought in.  No obvious abnormality such as hypokinesis or dissection was identified.  Several cardiologist came and to evaluate this.  Bronchoscopy was also performed because the patient had decreased oxygen saturation.  I also asked radiology to come in for pulmonary angiography which was negative.  We did give the patient 15,000 units of extra heparin when we felt a PE was a possibility.  All of her diagnostic studies were unremarkable.  After several hours including several rounds of CPR, the patient became more hemodynamically stable and we were able to get her to the intensive care unit where she was taken in guarded condition.  Overnight, patient experienced another cardiac arrest and received another 30 minutes of CPR.  This again was of unknown origin however she received supportive measures including re-intubation and pressor support.  Repeat echo was negative.  Nephology was consulted for management of ESRD on dialysis throughout hospital stay.  Cardiology also consulted due to cardiac arrest however no invasive intervention performed  while inpatient.  Patient was also covered with large spectrum antibiotics during post operative period due to differential including occult sepsis.  Patient was unable to be weaned from mechanical ventilation and tracheostomy was performed 04/22/17.  From a vascular surgery standpoint ischemic tissue changes of R foot remain stable.  However at some point she will require TMA by Dr. Sharol [name].  If it is believed she is not able to heal R TMA she will require endovascular revascularization.  She has had persistent lymphatic drainage from R groin and R JP drain remains in place.  This will likely be discontinued over the next day or so if drainage <50cc.  She is currently being weaned form ventilator support and has been accepted to LTAC.  From a vascular surgery standpoint she is stable enough for discharge to LTAC.  We will follow up to monitor ischemic changes or R LE.  Family is aware of patient's condition and that there is no urgent need for endovascular revascularization of RLE or TMA at this time.  Discharge was reviewed with the family and they voice their understanding.  She will be discharged to [facility] after the completion of HD in fair condition.   CBC    Component Value Date/Time   WBC 11.5 (H) 04/28/2017 0022   RBC 2.87 (L) 04/28/2017 0022   HGB 8.1 (L) 04/28/2017 0022   HCT 26.6 (L) 04/28/2017 0022   PLT 351 04/28/2017 0022   MCV 92.7 04/28/2017 0022   MCH 28.2 04/28/2017 0022   MCHC 30.5 04/28/2017 0022   RDW 17.7 (H) 04/28/2017 0022   LYMPHSABS 0.9 04/28/2017 0022  MONOABS 1.6 (H) 04/28/2017 0022   EOSABS 0.3 04/28/2017 0022   BASOSABS 0.0 04/28/2017 0022    BMET    Component Value Date/Time   NA 136 04/28/2017 0022   K 3.4 (L) 04/28/2017 0022   CL 97 (L) 04/28/2017 0022   CO2 24 04/28/2017 0022   GLUCOSE 244 (H) 04/28/2017 0022   BUN 59 (H) 04/28/2017 0022   CREATININE 5.43 (H) 04/28/2017 0022   CALCIUM 8.8 (L) 04/28/2017 0022   GFRNONAA 9 (L) 04/28/2017 0022   GFRAA 10  (L) 04/28/2017 0022       Discharge Diagnosis:  Peripheral vascular disease  Secondary Diagnosis: Patient Active Problem List   Diagnosis Date Noted  . 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 History of ETT   . Pressure injury of skin 04/22/2017  . Acute respiratory failure with hypoxia ([place])   . Cardiac arrest, cause unspecified ([place])   . Non-healing ulcer ([place])   . Hypertension 03/24/2017  . Hypertensive urgency 03/24/2017  . Cellulitis in diabetic foot ([place]) 03/24/2017  . Cellulitis 03/24/2017  . Chronic pain 03/24/2017  . Diabetic ulcer of lower leg ([place]) 03/24/2017  . Gangrene of toe of right foot ([place])   . Acute on chronic diastolic CHF (congestive heart failure) ([place]) 02/22/2016  . Fluid overload   . Pericardial effusion   . Ascites 02/15/2016  . DM (diabetes mellitus), type 2 with renal complications ([place]) 70/62/3762  . Pain   . Bimalleolar ankle fracture 02/12/2015  . CAD (coronary artery disease) 02/09/2015  . PAD (peripheral artery disease) ([place])   . Gastroparesis   . Chronic anemia   . Menorrhagia with regular cycle 01/04/2014  . Dysmenorrhea 01/04/2014  . Nausea & vomiting 10/16/2011  . History of noncompliance with medical treatment 05/09/2011  . Chronic UTI 05/09/2011  . CIN III (cervical intraepithelial neoplasia grade III) with severe dysplasia   . History of abscesses in groins 12/06/2010  . End stage renal disease on dialysis ([place])   . Coronary artery disease   . Hyperlipidemia   . 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 NONDEPENDENT TOBACCO USE DISORDER 12/20/2009  . Essential hypertension, benign 12/20/2009  . NEPHROTIC SYNDROME 12/20/2009  . Shortness of breath 12/20/2009  . Chest pain 12/20/2009   Past Medical History:  Diagnosis Date  . Arthritis   . CHF (congestive heart failure) ([place])   . Chronic anemia    2nd to renal disease  . CIN III (cervical intraepithelial neoplasia grade III) with severe dysplasia    S/P  LEEP AND [place]  . Coronary artery disease    Status post cardiac catheterization June 2012 scattered coronary artery disease/atherosclerosis with 70-80% stenosis in a small right PDA.  . Diabetic Charcot's joint disease ([place])   . DM (diabetes mellitus) ([place])    Long-term insulin  . End stage renal disease on dialysis [facility]) 05/02/11   "[facility]"; [facility]; M; W, F ("got it 04/15/2017 because of Thanksgiving")  . Fracture of 5th metatarsal 2016   Right  . Gastroparesis   . Heart murmur   . 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 History of abscesses in groins 12/06/2010  . Hyperlipidemia    Hypertriglyceridemia 449 HDL 25  . Hypertension   . Migraines    "just on dialysis days"  . Peripheral neuropathy    related to DM  . Peripheral vascular disease ([place])    Tibial occlusive disease evaluated by Dr. Kellie Simmering in August 2011. Medical therapy  .  Renal insufficiency    Dialysis since 2012  . Seizures ([place])    "only when her sugar drops" (04/17/2017)  . Tobacco use disorder    Discontinued March 2012  . Trimalleolar fracture of ankle, closed 02/09/2015   Right     Allergies as of 04/28/2017      Reactions   Cephalexin Other (See Comments)   Reaction unknown- Childhood allergy Tolerated Ceftriaxone in the past   Sulfamethoxazole-trimethoprim Other (See Comments)   Unknown reaction. Pt states that she was told by her mother that she had allergy to Bactrim as a child.      Medication List    TAKE these medications   amLODipine 10 MG tablet Commonly known as:  NORVASC Take 1 tablet (10 mg total) by mouth daily. What changed:  when to take this   amoxicillin-clavulanate 500-125 MG tablet Commonly known as:  AUGMENTIN Take 1 tablet See admin instructions by mouth. Three times a week after dialysis   aspirin 81 MG EC tablet Take 1 tablet (81 mg total) by mouth daily.   atorvastatin 40 MG tablet Commonly known as:  LIPITOR Take 40 mg by mouth daily.   AURYXIA 1 GM 210 MG(Fe)  tablet Generic drug:  ferric citrate Take 3 tablets 3 (three) times daily with meals by mouth.   cinacalcet 30 MG tablet Commonly known as:  SENSIPAR Take 2 tablets (60 mg total) by mouth daily with supper. What changed:  how much to take   cloNIDine 0.3 MG tablet Commonly known as:  CATAPRES Take 1 tablet (0.3 mg total) by mouth 3 (three) times daily.   cyclobenzaprine 10 MG tablet Commonly known as:  FLEXERIL Take 1 tablet (10 mg total) by mouth at bedtime. What changed:    when to take this  reasons to take this   HUMALOG MIX 50/50 KWIKPEN (50-50) 100 UNIT/ML Kwikpen Generic drug:  Insulin Lispro Prot & Lispro Inject 26 Units into the skin 2 (two) times daily.   hydrALAZINE 100 MG tablet Commonly known as:  APRESOLINE Take 1 tablet (100 mg total) by mouth every 8 (eight) hours.   HYDROcodone-acetaminophen 5-325 MG tablet Commonly known as:  NORCO Take 1 tablet by mouth every 6 (six) hours as needed for moderate pain.   insulin lispro 100 UNIT/ML injection Commonly known as:  HUMALOG Inject 6 Units into the skin 3 (three) times daily before meals.   isosorbide mononitrate 120 MG 24 hr tablet Commonly known as:  IMDUR Take 1 tablet (120 mg total) by mouth daily.   ketorolac 0.5 % ophthalmic solution Commonly known as:  ACULAR Place 1 drop 4 (four) times daily into the right eye.   labetalol 300 MG tablet Commonly known as:  NORMODYNE Take 1 tablet (300 mg total) by mouth 3 (three) times daily.   methyldopa 500 MG tablet Commonly known as:  ALDOMET Take 500 mg daily by mouth.   multivitamin Tabs tablet Take 1 tablet by mouth at bedtime.   pentoxifylline 400 MG CR tablet Commonly known as:  TRENTAL Take 1 tablet (400 mg total) by mouth 3 (three) times daily with meals.   prednisoLONE acetate 1 % ophthalmic suspension Commonly known as:  PRED FORTE Place 1 drop 4 (four) times daily into the right eye.   promethazine 12.5 MG tablet Commonly known as:   PHENERGAN Take 12.5 mg every 6 (six) hours as needed by mouth for nausea or vomiting.   RENVELA 800 MG tablet Generic drug:  sevelamer carbonate Take  4,800 mg by mouth 3 (three) times daily with meals.   spironolactone 100 MG tablet Commonly known as:  ALDACTONE Take 100 mg daily by mouth.   tobramycin-dexamethasone ophthalmic ointment Commonly known as:  TOBRADEX Place 1 application 4 (four) times daily into the right eye.   traMADol 50 MG tablet Commonly known as:  ULTRAM Take 1 tablet (50 mg total) by mouth every 6 (six) hours as needed for moderate pain (or Headache unrelieved by tylenol).       Instructions:  [facility] Discharge Instructions Open Aortic Surgery  Please refer to the following instructions for your post-procedure care. Your surgeon or Physician Assistant will discuss any changes with you.  Activity  Avoid lifting more than eight pounds (a gallon of milk) until after your first post-operative visit. You are encouraged to walk as much as you can. You can slowly return to normal activities but must avoid strenuous activity and heavy lifting until your doctor tells you it's OK. Heavy lifting can hurt the incision and cause a hernia. Avoid activities such as vacuuming or swinging a golf club. It is normal to feel tired for several weeks after your surgery. Do not drive until your doctor gives the OK and you are no longer taking prescription pain medications. It is also normal to have difficulty with sleep habits, eating and bowl movements after surgery. These will go away with time.  Bathing/Showering  You may shower after you go home. Do not soak in a bathtub, hot tub, or swim until the incision heals.  Incision Care  Shower every day. Clean your incision with mild soap and water. Pat the area dry with a clean towel. You do not need a bandage unless otherwise instructed. Do not apply any ointments or creams to your incision. You  may have skin glue on your incision. Do not peel it off. It will come off on its own in about one week. If you have staples or sutures along your incision, they will be removed at your post op appointment.  If you have groin incisions, wash the groin wounds with soap and water daily and pat dry. (No tub bath-only shower)  Then put a dry gauze or washcloth in the groin to keep this area dry to help prevent wound infection.  Do this daily and as needed.  Do not use Vaseline or neosporin on your incisions.  Only use soap and water on your incisions and then protect and keep dry.  Diet  Resume your normal diet. There are no special food restriction following this procedure. A low fat/low cholesterol diet is recommended for all patients with vascular disease. After your aortic surgery, it's normal to feel full faster than usual and to not feel as hungry as you normally would. You will probably lose weight initially following your surgery. It's best to eat small, frequent meals over the course of the day. Call the office if you find that you are unable to eat even small meals. In order to heal from your surgery, it is CRITICAL to get adequate nutrition. Your body requires vitamins, minerals, and protein. Vegetables are the best source of vitamins and minerals. causing pain, you may take over-the-counter pain reliever such as acetaminophen (Tylenol). If you were prescribed a stronger pain medication, please be aware these medication can cause nausea and constipation. Prevent nausea by taking the medication with a snack or meal. Avoid constipation by drinking plenty of fluids and eating foods with a  high amount of fiber, such as fruits, vegetables and grains. Take 100mg  of the over-the-counter stool softener Colace twice a day as needed to help with constipation. A laxative, such as Milk of Magnesia, may be recommended for you at this time. Do not take a laxative unless your surgeon or Physician Assistant. tells you  it's OK. Do not take Tylenol if you are taking stronger pain medications (such as Percocet).  Follow Up  Our office will schedule a follow up appointment 2-3 weeks after discharge.  Please call us immediately for any of the following conditions    .     Severe or worsening pain in your legs or feet or in your abdomen back or chest. Increased pain, redness drainage (pus) from your incision site. Increased abdominal pain, bloating, nausea, vomiting, or persistent diarrhea. Fever of 101 degrees or higher. Swelling in your leg (s).  Reduce your risk of vascular disease  Stop smoking. If you would like help, call [facility] at 1-800-QUIT-NOW (204) 858-4999) or [place] at 6514387183. Manage your cholesterol Maintain a desired weight Control your diabetes Keep your blood pressure down  If you have any questions please call the office at [phone].   Disposition: LTAC  Patient's condition: is Fair  Follow up: 1. Dr. Trula [name] in 2 weeks   Dagoberto Ligas, PA-C [facility] [phone] 04/28/2017  2:46 PM   - For VQI Registry use -  Post-op:  Time to Extubation: []  In OR, [ ]  < 12 hrs, [ ]  12-24 hrs, [ x] >=24 hrs Vasopressors Req. Post-op: Yes ICU Stay: 14 day in ICU Transfusion: Yes    New Arrhythmia: Yes  Complications: CHF: No Resp failure: Yes, [ ]  Pneumonia, [x ] Ventilator Chg in renal function: No, [ ]  Inc. Cr > 0.5, [ ]  Temp. Dialysis, [ ]  Permanent dialysis Leg ischemia: No, no Surgery needed, [ ]  Yes, Surgery needed, [ ]  Amputation Bowel ischemia: No, [ ]  Medical Rx, [ ]  Surgical Rx Wound complication: No, [ ]  Superficial separation/infection, [ ]  Return to OR Return to OR: No  Return to OR for bleeding: No Stroke: No, [ ]  Minor, [ ]  Major  Discharge medications: Statin use:  Yes If No:   ASA use:  Yes  If No:   Plavix use:  No  Beta blocker use:  Yes  ACEI use:  No ARB use:  No CCB use:  No Coumadin use:  No

## 2017-04-28 NOTE — Progress Notes (Signed)
    Subjective  -   No acute events   Physical Exam:  Right groin soft without erythema Drain with SS output Dry gangreen to several right toes       Assessment/Plan:   Keep JP in place If decreases to < 30 cc/ 24 hours, can d/c Will need toe amps vs TMA, but could delay given clinical condition  [name] 04/28/2017 8:36 AM --  Vitals:   04/28/17 0808 04/28/17 0825  BP: (!) 177/85   Pulse:    Resp:    Temp:  99.1 F (37.3 C)  SpO2: 100%     Intake/Output Summary (Last 24 hours) at 04/28/2017 0836 Last data filed at 04/28/2017 0750 Gross per 24 hour  Intake 728.29 ml  Output 925 ml  Net -196.71 ml     Laboratory CBC    Component Value Date/Time   WBC 11.5 (H) 04/28/2017 0022   HGB 8.1 (L) 04/28/2017 0022   HCT 26.6 (L) 04/28/2017 0022   PLT 351 04/28/2017 0022    BMET    Component Value Date/Time   NA 136 04/28/2017 0022   K 3.4 (L) 04/28/2017 0022   CL 97 (L) 04/28/2017 0022   CO2 24 04/28/2017 0022   GLUCOSE 244 (H) 04/28/2017 0022   BUN 59 (H) 04/28/2017 0022   CREATININE 5.43 (H) 04/28/2017 0022   CALCIUM 8.8 (L) 04/28/2017 0022   GFRNONAA 9 (L) 04/28/2017 0022   GFRAA 10 (L) 04/28/2017 0022    COAG Lab Results  Component Value Date   INR 1.20 04/22/2017   INR 1.31 04/18/2017   INR 1.14 04/15/2017   No results found for: PTT  Antibiotics Anti-infectives (From admission, onward)   Start     Dose/Rate Route Frequency Ordered Stop   04/23/17 0600  metroNIDAZOLE (FLAGYL) IVPB 500 mg     500 mg 100 mL/hr over 60 Minutes Intravenous Every 8 hours 04/22/17 2348 04/26/17 0045   04/21/17 1500  vancomycin (VANCOCIN) IVPB 1000 mg/200 mL premix     1,000 mg 200 mL/hr over 60 Minutes Intravenous Every M-W-F (Hemodialysis) 04/21/17 1404 04/25/17 2359   04/20/17 1130  vancomycin (VANCOCIN) 2,000 mg in sodium chloride 0.9 % 500 mL IVPB     2,000 mg 250 mL/hr over 120 Minutes Intravenous  Once 04/20/17 1043 04/20/17 1328   04/18/17  1700  levofloxacin (LEVAQUIN) IVPB 500 mg     500 mg 100 mL/hr over 60 Minutes Intravenous Every 48 hours 04/17/17 0933 04/24/17 1708   04/16/17 1700  levofloxacin (LEVAQUIN) IVPB 750 mg  Status:  Discontinued     750 mg 100 mL/hr over 90 Minutes Intravenous Every 48 hours 04/16/17 1544 04/17/17 0933   04/16/17 1530  metroNIDAZOLE (FLAGYL) IVPB 500 mg  Status:  Discontinued     500 mg 100 mL/hr over 60 Minutes Intravenous Every 8 hours 04/16/17 1512 04/22/17 2348   04/16/17 0636  vancomycin (VANCOCIN) 1-5 GM/200ML-% IVPB    Comments:  [name]   : cabinet override      04/16/17 0636 04/16/17 0820   04/16/17 0633  vancomycin (VANCOCIN) IVPB 1000 mg/200 mL premix     1,000 mg 200 mL/hr over 60 Minutes Intravenous 60 min pre-op 04/16/17 0633 04/16/17 0920       V. Leia Alf, M.D. [facility] [place] Office: [phone] Pager:  [phone]

## 2017-04-28 NOTE — Progress Notes (Signed)
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  The patient was profoundly acidotic and once that was addressed patient had a stable rhythm and was brought to the ICU for CRRT to address profound metabolic acidosis.  Patient was unresponsive upon arrival but with stabilization of BP reportedly became responsive.  PCCM was consulted for vent and vital signs management. Cardiac arrest x2 and 48 hours suspected from profound acidotic state.  SUBJECTIVE/INTERVAL:   Tolerating weaning well, 5/5 at 30% FiO2.   VITAL SIGNS: BP (!) 177/85   Pulse 71   Temp 99.1 F (37.3 C) (Oral)   Resp 16   Ht 6\' 4"  (1.93 m)   Wt 84.5 kg (186 lb 4.6 oz)   LMP 10/09/2015 Comment: pt on dialysis  SpO2 100%   BMI 22.68 kg/m   HEMODYNAMICS:    VENTILATOR SETTINGS: Vent Mode: PSV;CPAP FiO2 (%):  [40 %] 40 % Set Rate:  [16 bmp] 16 bmp Vt Set:  [660 mL] 660 mL PEEP:  [5 cmH20] 5 cmH20 Pressure Support:  [10 cmH20-12 cmH20] 10 cmH20 Plateau Pressure:  [13 cmH20-23 cmH20] 13 cmH20  INTAKE / OUTPUT: I/O last 3 completed shifts: In: 1376.2 [I.V.:332.4; Other:30; NG/GT:1013.8] Out: 1607 [Emesis/NG output:700; Drains:205; Stool:750]  PHYSICAL EXAMINATION:  General: female on Vent, awake, calm HEENT: MMM, PERRL Neuro: awake, follows commands CV: RRR, no m/r/g  PULM: CTA , no wheezing  GI: soft , BS + Extremities/skin: Bilateral lower ext w/ ischemic changes, R>L .   LABS:  BMET Recent Labs  Lab 04/26/17 0233 04/27/17 0258 04/28/17 0022  NA 135  136 135 136  K 3.6  3.6 3.5 3.4*  CL  97*  98* 96* 97*  CO2 24  26 25 24   BUN 28*  29* 40* 59*  CREATININE 3.16*  3.21* 4.42* 5.43*  GLUCOSE 197*  197* 144* 244*    Electrolytes Recent Labs  Lab 04/23/17 0536 04/24/17 0423  04/26/17 0233 04/27/17 0258 04/28/17 0022  CALCIUM 8.5* 8.3*   < > 8.3*  8.3* 8.8* 8.8*  MG 2.0 2.0  --  1.9  --   --   PHOS 5.7* 4.7*   < > 5.0*  5.1* 6.0* 7.0*   < > = values in this interval not displayed.    CBC Recent Labs  Lab 04/26/17 0233 04/27/17 0258 04/28/17 0022  WBC 14.9* 11.8* 11.5*  HGB 8.2* 8.1* 8.1*  HCT 26.6* 27.1* 26.6*  PLT 333 348 351    Coag's Recent Labs  Lab 04/22/17 1020  INR 1.20    Sepsis Markers Recent Labs  Lab 04/22/17 0323 04/25/17 0330  PROCALCITON 18.61 12.19    ABG Recent Labs  Lab 04/22/17 0352 04/24/17 0400  PHART 7.514* 7.587*  PCO2ART 28.6* 25.6*  PO2ART 69.0* 75.7*    Liver Enzymes Recent Labs  Lab 04/22/17 0323  04/25/17 0330 04/26/17 0233 04/27/17 0258 04/28/17 0022  AST 54*  --  24  --   --   --   ALT 94*  --  36  --   --   --  ALKPHOS 218*  --  255*  --   --   --   BILITOT 1.8*  --  0.9  --   --   --   ALBUMIN 2.2*   < > 1.9*  1.9* 1.8* 2.0* 2.0*   < > = values in this interval not displayed.    Cardiac Enzymes No results for input(s): TROPONINI, PROBNP in the last 168 hours.  Glucose Recent Labs  Lab 04/27/17 1648 04/27/17 1721 04/27/17 2053 04/28/17 0010 04/28/17 0527 04/28/17 0823  GLUCAP 255* 264* 189* 240* 200* 188*    Imaging Dg Abd Portable 1v  Result Date: 04/27/2017 CLINICAL DATA:  Nasogastric tube placement. EXAM: PORTABLE ABDOMEN - 1 VIEW COMPARISON:  Abdominal radiograph April 26, 2017 FINDINGS: Feeding tube tip projects in distal duodenum, similarly position. Nasogastric tube tip projects in proximal stomach. Multiple loops of gas-filled nondistended small and large bowel. No intra-abdominal mass effect. Vascular calcifications noted. Soft tissue planes and included osseous  structures are unchanged. IMPRESSION: Feeding tube tip projects in distal duodenum. Nasogastric tube tip projects in proximal stomach. Resolving Ileus. Aortic Atherosclerosis (ICD10-I70.0). Electronically Signed   By: Elon Alas M.D.   On: 04/27/2017 13:57    STUDIES:  Abdominal/chest CT 11/21>>> no evidence of pulmonary embolism.  Nondiagnostic CT of the abdomen and pelvis.  Probable hematoma in the right superficial femoral Artery.  Moderate to large volume this ascites Head CT 11/21>>> not done KUB 11/25 no evidence ileus or obstruction.  CT chest 11/26 >  Acute anterior right second through fifth and anterior left third through fifth rib fractures, many of which are displaced. Nondisplaced mid sternal fracture. Bilateral lower lobe airspace consolidations, atelectasis versus aspiration. Small pleural effusions. Suspect trace pulmonary edema. Aortic atherosclerosis with coronary artery disease. KUB 12/2 > resolving ileus  CULTURES: Blood 11/21>>>NEG  Blood culture 04/19/2017>>NEG  Urine culture 04/20/2017 done via in and out cath with 30 cc obtained>>NEG  Sputum culture 04/20/2017>>NML flora   ANTIBIOTICS: Levaquin 11/21>>> 11/30 Flagyl 11/21>>> 11/30 Vancomycin 11/25/ >>> 11/30  SIGNIFICANT EVENTS:  11/21>>> Cardiac arrest in OR 04/17/2017 extubated 04/18/2017>> second cardiac arrest requiring intubation and 30 minutes of CPR 04/20/2017 in and out cath with 30 cc obtained 04/20/2017 no extubation at this time secondary to chest wall pain 11/27 trach  LINES/TUBES: ETT 11/21>>>11/22 reintubated 11/23 for cardiac arrest>> 11/27 L femoral HD catheter 11/21>>> 11/28 R radial a-line 11/21>>> 11/26 Trach 11/27 >   DISCUSSION: 41 year old type I diabetic with all the classic complications with cardiac arrest in the OR for 2 hours that is now following commands so no need for cooling.  Rapidly coming off pressors with addressing of acidosis.  Cause of arrest suspected to be  acidosis after reperfusion in a patient with ESRD vs ischemic bowel.  Slow to wean due to chest pain but wakes up and is neuro intact.   ASSESSMENT / PLAN:  PULMONARY A: ARDS and VDRF post CPR for 2 hours > improved Difficulty weaning due to chest wall pain - s/p trach 11/27 Mild pulmonary edema Resp alkalosis overnight 11/28 - rate adjusted  Multiple rib Fx many of which displaced (Curbside CVTS feel little benefit for rib plating)   P:   Continue to wean as tolerated - goal to progress to ATC and once there, will ask SLP to assist with PMV trials / swallow eval Assess ABG in 1 hour, if OK on PS 5/5 with 30% FiO2, attempt ATC Follow CXR  VAP bundle  CARDIOVASCULAR  A:  Circulatory Shock, unknown cause, resolved after resolution of acidosis HTN- off Cleviprex 11/29 . Becomes sx with sbp <140.  Arrest x2 S/p R fem artery endartectomy 11/21  P:  Continue norvasc , catapress, hydralazine, labetolol and aldactone - goal SBP 160 - 200 per renal  Cont tele  Cardiology following  RENAL A:   ESRD - s/p CRRT . HD on M/W/F  Metabolic acidosis - resolved Hyponatremia - suspect hypervolemic, resolved Hypokalemia P:   Nephrology following for HD (M/W/F) 22mEq K per tube Follow BMP  GASTROINTESTINAL A:   Nutrition Ileus? Transaminitis - Abdominal CT is done 04/18/2018 which demonstrated probable hematoma in the right superficial femoral artery.  Also notes a large to moderate volume of ascites. KUB 11/25 with no evidence ileus or obstruction.. KUB on 12/1 w/ resolving ileus.  P:   Continue TF at trickle for now, gradually advance Might need PEG as anticipate prolonged wean from trach / vent support.  As of 12/3, she is weaning from vent and tolerating very well.  Might be able to progress to ATC with PMV / SLP eval soon and hopefully avoid PEG Continue bowel regimen, PPI, reglan  HEMATOLOGIC A:   Mild anemia P:  Transfuse for Hgb < 7 Follow CBC  INFECTIOUS A:   Concern for  ischemic bowel or intraabdominal infection - s/p 10 days abx as of 11/30 (levaquin, flagyl, vanc)-  Occult sepsis concern bacteremia, intraabdominal, PCT elevated P:   Follow cultures through completion Limit tylenol with bump in AST/ALT   ENDOCRINE A:   DM   P:   SSI CBG  NEUROLOGIC A:   Acute encephalopathy - with sedation for mechanical ventilation P:   RASS goal: 0 to -1 Fentanyl infusion / Midazolam PRN Pain control as needed.  Her sternum is extremely tender from previous CPR x 2  FAMILY  - Updates: No family available  - Inter-disciplinary family meet or Palliative Care meeting due by:  day 7   CC time: 30 min.   Montey Hora, [place] [facility] Pager: [phone]  or [phone] 04/28/2017, 8:46 AM   STAFF NOTE: I, Merrie Roof, MD FACP have personally reviewed patient's available data, including medical history, events of note, physical examination and test results as part of my evaluation. I have discussed with resident/NP and other care providers such as pharmacist, RN and RRT. In addition, I personally evaluated patient and elicited key findings of: calm on HD, FC, jvd downward, trach clean, chest sounds coarse, abdo soft , BS wnl, less edema, ischemic changes, pcxr which I reviewed shows rt base atx / infiltrate last done, I also reviewed CT x 1 in past which showed some ascites nothing overwhelming and small effusions in the past, she is oN HD now, would maintain neg balance, I do not see need paracentesis, would ocnisder this is weaning continues to be poorly but would need to re image abso with [place], weaning this am cpap 5 PS 5 fails secondary to Tv and rate issue, would increase PS 12, needed, unable to move to trach coallr, she continues to also need fent infusion, consider transition attempt to fent patch and int fent, and goal to dc fent drip, if she fails this we may need to try int dilauded as agent of choice to  transition off fent drip, mobility also needs to be addressed, PEG is likely to be needed given above SBT attempt, Glu are  out of range, lantus increase needed, I updated pt and fmaily, K bath will correct K  The patient is critically ill with multiple organ systems failure and requires high complexity decision making for assessment and support, frequent evaluation and titration of therapies, application of advanced monitoring technologies and extensive interpretation of multiple databases.   Critical Care Time devoted to patient care services described in this note is 30 Minutes. This time reflects time of care of this signee: Merrie Roof, MD FACP. This critical care time does not reflect procedure time, or teaching time or supervisory time of PA/NP/Med student/Med Resident etc but could involve care discussion time. Rest per NP/medical resident whose note is outlined above and that I agree with   Lavon Paganini. Titus Mould, MD, [place] Pgr: [place] [facility] 04/28/2017 11:47 AM

## 2017-04-28 NOTE — Care Management Note (Signed)
Case Management Note Marvetta Gibbons RN, BSN Unit 4E-Case Manager-- [place] coverage [phone]  Patient Details  Name: [name] MRN: [id number] Date of Birth: [birth year]  Subjective/Objective:   Pt admitted  s/p: Right femoral endarterectomy with intraoperative cardiac arrest 11/21- to ICU intubated and CRRT, then had PEA arrest 2023/04/28 am and was coded and re-intubated                  Action/Plan: PTA pt lived at home with spouse- CM to follow for d/c needs.   Expected Discharge Date:       04/28/17           Expected Discharge Plan:  Long Term Acute Care (LTAC)  In-House Referral:  NA  Discharge planning Services  CM Consult  Post Acute Care Choice:  Long Term Acute Care (LTAC) Choice offered to:  Spouse, Patient  DME Arranged:    DME Agency:  NA  HH Arranged:  NA HH Agency:  NA  Status of Service:  Completed, signed off  If discussed at [place] of Stay Meetings, dates discussed:    Discharge Disposition: LTACH- [facility]   Additional Comments:  04/28/17- 1030- Nneoma Harral RN, CM- spoke with [facility] representative- Santiago Glad this AM- they have a bed to offer pt today if medically stable for transition to [facility]- call made to Dr. Trula Slade- to discuss pt readiness for [facility]- per conversation with DR. Brabham - agreeable to pt going to [facility] today- and he will follow pt there- call made to [facility]. -vascular PA for d/c order and summary- went to pt's room and spoke with spouse regarding transition to [facility] today- spouse agreeable to this plan for today- call also made to [place] with [facility] that MD agreeable and family agreeable- plan will be to accept bed offer on [facility] and pt will tx later today after HD. Bedside RN- Tanya aware of plan.   04/25/17- 1600- Eliah Marquard RN, CM- pt now trached- remains on Vent- attempting to wean- per QC mtg recommendation for possible LTACH- spoke with Lennie Muckle vascular PA- regarding LTACH referral- verbal order given - CM spoke with  pt's spouse/pt and aunt at bedside regarding transition of care plan- discussed LTACH option when pt medically stable for tx to next phase of care- choice offered for [facility] or [facility]- per spouse he would prefer [facility] as first choice but would consider [facility] if bed is not available for [facility] at time when pt ready for tx. - Referrals called to both [facility] and [facility] for review and will look at pt first of next week for possible readiness and bed availability.    Dawayne Patricia, RN 04/28/2017, 11:04 AM

## 2017-04-28 NOTE — Progress Notes (Signed)
Pt placed back on full vent support due to increased RR & agitation.

## 2017-04-28 NOTE — Progress Notes (Signed)
[facility] Progress Note   Assessment/ Plan:   1. SP cardiac arrest - 11/21, CPR x 2 hrs. 2nd arrest on 11/23 2. SP R fem artery endarterectomy  - 11/21 by VVS 3. ESRD - sp CRRT, back on HD MWF.  Plan for HD today with 4K bath 4. HTN , severe at baseline, normal BP's are 209- 470 systolic.  Reportedly does not tolerate well SBP's below 140. Cont permissive HTN, cont home BP meds. 5. Volume - 4 kg under dry wt, euvolemic on exam.  Will need new EDW at discharge likely. 6. VDRF - sp trach, will need ltac 7. Anemia of CKD - Hb 8- 9 range. getting darbe max dose 200 ug / wk 8. DM - per primary   Subjective:    Awake, mouthing words.  AAO.  For HD today.   Objective:   BP (!) 177/85   Pulse 71   Temp 99.1 F (37.3 C) (Oral)   Resp 16   Ht '6\' 4"'  (1.93 m)   Wt 84.5 kg (186 lb 4.6 oz)   LMP 10/09/2015 Comment: pt on dialysis  SpO2 100%   BMI 22.68 kg/m   Physical Exam: GEN awake and alert HEENT + trach NECK no JVD PULM clear anteriorly CV RRR ABD + mildy distended EXT trace LE edema NEURO AAO x3 ACCESS: LUE AVF   Labs: BMET Recent Labs  Lab 04/22/17 2322 04/23/17 0536 04/24/17 0423 04/25/17 0330 04/26/17 0233 04/27/17 0258 04/28/17 0022  NA 135 134* 134* 133* 135  136 135 136  K 3.5 3.3* 3.8 3.8 3.6  3.6 3.5 3.4*  CL 96* 96* 94* 95* 97*  98* 96* 97*  CO2 19* 21* '23 23 24  26 25 24  ' GLUCOSE 110* 122* 150* 107* 197*  197* 144* 244*  BUN 30* 32* 20 42* 28*  29* 40* 59*  CREATININE 4.69* 4.81* 3.22* 4.35* 3.16*  3.21* 4.42* 5.43*  CALCIUM 8.6* 8.5* 8.3* 8.5* 8.3*  8.3* 8.8* 8.8*  PHOS 5.6* 5.7* 4.7* 7.4* 5.0*  5.1* 6.0* 7.0*   CBC Recent Labs  Lab 04/25/17 0330 04/26/17 0233 04/27/17 0258 04/28/17 0022  WBC 18.4* 14.9* 11.8* 11.5*  NEUTROABS 15.4* 12.2* 9.2* 8.8*  HGB 8.2* 8.2* 8.1* 8.1*  HCT 26.6* 26.6* 27.1* 26.6*  MCV 91.1 91.7 91.9 92.7  PLT 281 333 348 351    '@IMGRELPRIORS' @ Medications:    . amLODipine  10 mg Oral  Daily  . chlorhexidine gluconate (MEDLINE KIT)  15 mL Mouth Rinse BID  . cloNIDine  0.3 mg Per NG tube TID  . darbepoetin (ARANESP) injection - DIALYSIS  200 mcg Intravenous Q Wed-HD  . docusate  100 mg Per Tube BID  . feeding supplement (PRO-STAT SUGAR FREE 64)  60 mL Per Tube QID  . feeding supplement (VITAL 1.5 CAL)  1,000 mL Per Tube Q24H  . fentaNYL (SUBLIMAZE) injection  200 mcg Intravenous Once  . heparin  5,000 Units Subcutaneous Q8H  . hydrALAZINE  100 mg Oral Q8H  . insulin aspart  0-15 Units Subcutaneous Q4H  . insulin glargine  5 Units Subcutaneous Daily  . labetalol  300 mg Oral TID  . mouth rinse  15 mL Mouth Rinse 10 times per day  . methyldopa  500 mg Oral Daily  . metoCLOPramide  5 mg Per Tube Q8H  . multivitamin  1 tablet Oral QHS  . pantoprazole sodium  40 mg Per Tube Daily  . potassium chloride  40 mEq  Per Tube Once  . sennosides  5 mL Per Tube BID  . spironolactone  100 mg Per NG tube Daily     Madelon Lips, MD 04/28/2017, 9:12 AM

## 2017-04-29 ENCOUNTER — Other Ambulatory Visit (HOSPITAL_COMMUNITY): Payer: Medicare Other

## 2017-04-29 DIAGNOSIS — I517 Cardiomegaly: Secondary | ICD-10-CM | POA: Diagnosis not present

## 2017-04-29 DIAGNOSIS — K567 Ileus, unspecified: Secondary | ICD-10-CM | POA: Diagnosis not present

## 2017-04-29 LAB — DIFFERENTIAL
BASOS ABS: 0 10*3/uL (ref 0.0–0.1)
BLASTS: 0 %
Band Neutrophils: 0 %
Basophils Relative: 0 %
EOS ABS: 0 10*3/uL (ref 0.0–0.7)
Eosinophils Relative: 0 %
LYMPHS PCT: 10 %
Lymphs Abs: 1.2 10*3/uL (ref 0.7–4.0)
MONOS PCT: 8 %
Metamyelocytes Relative: 1 %
Monocytes Absolute: 1 10*3/uL (ref 0.1–1.0)
Myelocytes: 1 %
NEUTROS ABS: 10.2 10*3/uL — AB (ref 1.7–7.7)
NEUTROS PCT: 80 %
Other: 0 %
PROMYELOCYTES ABS: 0 %
nRBC: 0 /100 WBC

## 2017-04-29 LAB — COMPREHENSIVE METABOLIC PANEL
ALT: 19 U/L (ref 14–54)
AST: 21 U/L (ref 15–41)
Albumin: 2.1 g/dL — ABNORMAL LOW (ref 3.5–5.0)
Alkaline Phosphatase: 168 U/L — ABNORMAL HIGH (ref 38–126)
Anion gap: 17 — ABNORMAL HIGH (ref 5–15)
BILIRUBIN TOTAL: 0.9 mg/dL (ref 0.3–1.2)
BUN: 37 mg/dL — ABNORMAL HIGH (ref 6–20)
CALCIUM: 8.9 mg/dL (ref 8.9–10.3)
CHLORIDE: 92 mmol/L — AB (ref 101–111)
CO2: 26 mmol/L (ref 22–32)
Creatinine, Ser: 3.96 mg/dL — ABNORMAL HIGH (ref 0.44–1.00)
GFR, EST AFRICAN AMERICAN: 15 mL/min — AB (ref >60)
GFR, EST NON AFRICAN AMERICAN: 13 mL/min — AB (ref >60)
GLUCOSE: 144 mg/dL — AB (ref 65–99)
Potassium: 3.1 mmol/L — ABNORMAL LOW (ref 3.5–5.1)
Sodium: 135 mmol/L (ref 135–145)
Total Protein: 5.9 g/dL — ABNORMAL LOW (ref 6.5–8.1)

## 2017-04-29 LAB — CBC
HCT: 26.2 % — ABNORMAL LOW (ref 36.0–46.0)
HEMOGLOBIN: 8 g/dL — AB (ref 12.0–15.0)
MCH: 28.4 pg (ref 26.0–34.0)
MCHC: 30.5 g/dL (ref 30.0–36.0)
MCV: 92.9 fL (ref 78.0–100.0)
Platelets: 335 10*3/uL (ref 150–400)
RBC: 2.82 MIL/uL — AB (ref 3.87–5.11)
RDW: 17.4 % — ABNORMAL HIGH (ref 11.5–15.5)
WBC: 12.4 10*3/uL — ABNORMAL HIGH (ref 4.0–10.5)

## 2017-04-29 LAB — PROTIME-INR
INR: 1.09
PROTHROMBIN TIME: 14 s (ref 11.4–15.2)

## 2017-04-29 LAB — MAGNESIUM: MAGNESIUM: 2.4 mg/dL (ref 1.7–2.4)

## 2017-04-29 LAB — C DIFFICILE QUICK SCREEN W PCR REFLEX
C DIFFICILE (CDIFF) INTERP: NOT DETECTED
C DIFFICILE (CDIFF) TOXIN: NEGATIVE
C Diff antigen: NEGATIVE

## 2017-04-29 LAB — PHOSPHORUS: Phosphorus: 4.8 mg/dL — ABNORMAL HIGH (ref 2.5–4.6)

## 2017-04-30 ENCOUNTER — Other Ambulatory Visit (HOSPITAL_COMMUNITY): Payer: Medicare Other

## 2017-04-30 DIAGNOSIS — Z4589 Encounter for adjustment and management of other implanted devices: Secondary | ICD-10-CM | POA: Diagnosis not present

## 2017-04-30 LAB — CBC
HEMATOCRIT: 26.2 % — AB (ref 36.0–46.0)
HEMOGLOBIN: 8.1 g/dL — AB (ref 12.0–15.0)
MCH: 28.5 pg (ref 26.0–34.0)
MCHC: 30.9 g/dL (ref 30.0–36.0)
MCV: 92.3 fL (ref 78.0–100.0)
Platelets: 302 10*3/uL (ref 150–400)
RBC: 2.84 MIL/uL — ABNORMAL LOW (ref 3.87–5.11)
RDW: 18.2 % — AB (ref 11.5–15.5)
WBC: 12 10*3/uL — ABNORMAL HIGH (ref 4.0–10.5)

## 2017-04-30 LAB — RENAL FUNCTION PANEL
ALBUMIN: 2.1 g/dL — AB (ref 3.5–5.0)
ANION GAP: 16 — AB (ref 5–15)
BUN: 42 mg/dL — ABNORMAL HIGH (ref 6–20)
CALCIUM: 8.8 mg/dL — AB (ref 8.9–10.3)
CO2: 24 mmol/L (ref 22–32)
Chloride: 95 mmol/L — ABNORMAL LOW (ref 101–111)
Creatinine, Ser: 5.58 mg/dL — ABNORMAL HIGH (ref 0.44–1.00)
GFR calc non Af Amer: 9 mL/min — ABNORMAL LOW (ref >60)
GFR, EST AFRICAN AMERICAN: 10 mL/min — AB (ref >60)
Glucose, Bld: 180 mg/dL — ABNORMAL HIGH (ref 65–99)
POTASSIUM: 3.3 mmol/L — AB (ref 3.5–5.1)
Phosphorus: 6 mg/dL — ABNORMAL HIGH (ref 2.5–4.6)
Sodium: 135 mmol/L (ref 135–145)

## 2017-04-30 NOTE — Progress Notes (Addendum)
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  Sensation ball of foot intact  Assessment/Planning: R CFA, profunda, SFA, and EIA endarterectomy with patch angioplasty by Dr. Trula Slade suffered a cardiac arrest with resuscitative efforts for 2 hours  Currently admitted [facility]  Maintain drain  Future plans for toe 3 and 5 amputation verse transmet  [name] 04/30/2017 9:40 AM --  Laboratory Lab Results: Recent Labs    04/28/17 0022 04/29/17 0638  WBC 11.5* 12.4*  HGB 8.1* 8.0*  HCT 26.6* 26.2*  PLT 351 335   BMET Recent Labs    04/28/17 0022 04/29/17 0638  NA 136 135  K 3.4* 3.1*  CL 97* 92*  CO2 24 26  GLUCOSE 244* 144*  BUN 59* 37*  CREATININE 5.43* 3.96*  CALCIUM 8.8* 8.9    COAG Lab Results  Component Value Date   INR 1.09 04/29/2017   INR 1.20 04/22/2017   INR 1.31 04/18/2017   No results found for: PTT  I agree with the above.  Continue to monitor JP output as well as the appearance of the right groin.  She should be maintained on Keflex while the JP is in place.  Plans for surgical evaluation of the lymphatic drainage in the right groin and amputation of the toes will be performed at a later date, once the patient is more clinically stable  [place] Jadarrius Maselli

## 2017-04-30 NOTE — Consult Note (Signed)
[facility] CONSULT NOTE    Date: 04/30/2017                  Patient Name:  [name]  MRN: [id number]  DOB: 10/07/1975  Age / Sex: 41 y.o., female         PCP: Merrilee Seashore, MD                 Service Requesting Consult: Hospitalist                 Reason for Consult:  Evaluation management of end-stage renal disease on hemodialysis.            History of Present Illness: Patient is a 41 y.o. female with a PMHx of ESRD on HD MWF followed by [facility], hypertension, anemia of chronic kidney disease, secondary hyperparathyroidism, restrictive cardiomyopathy, coronary artery disease, peripheral vascular disease who was suffered cardiac arrest on April 16, 2017 as well as April 18, 2017.  The patient suffered her first cardiac arrest during vascular procedure.  She is status post right femoral artery endarterectomy.  During her first cardiac arrest CPR was administered for 2 hours.  She now has ventilator dependent respiratory failure and has a tracheostomy in place.  She last had dialysis on Monday.  She will be due for hemodialysis again today.  Her daughter is currently at the bedside.   Medications: Humalog insulin, amlodipine 10 mg daily, aspirin 81 mg daily, Lipitor 40 mg nightly, clonidine 0.3 mg 3 times daily, cyclobenzaprine 10 mg nightly, Colace 10 mg twice daily, heparin 5000 units subcutaneous every 8 hours, hydralazine 100 mg 3 times daily, isosorbide mononitrate 120 mg daily, labetalol 300 mg 3 times daily, Lantus 5 units nightly, Reglan 5 mg 3 times daily, pantoprazole 40 mg daily, spironolactone 100 mg daily, multivitamin 1 tablet daily   Allergies: Allergies  Allergen Reactions  . Cephalexin Other (See Comments)    Reaction unknown- Childhood allergy Tolerated Ceftriaxone in the past  . Sulfamethoxazole-Trimethoprim Other (See Comments)    Unknown reaction. Pt states that she was told by her mother that she had allergy to  Bactrim as a child.      Past Medical History: Past Medical History:  Diagnosis Date  . Arthritis   . CHF (congestive heart failure) ([place])   . Chronic anemia    2nd to renal disease  . CIN III (cervical intraepithelial neoplasia grade III) with severe dysplasia    S/P LEEP AND CONE  . Coronary artery disease    Status post cardiac catheterization June 2012 scattered coronary artery disease/atherosclerosis with 70-80% stenosis in a small right PDA.  . Diabetic Charcot's joint disease ([place])   . DM (diabetes mellitus) ([place])    Long-term insulin  . End stage renal disease on dialysis [facility]) 05/02/11   "[facility]"; [facility]; M; W, F ("got it 04/15/2017 because of Thanksgiving")  . Fracture of 5th metatarsal 2016   Right  . Gastroparesis   . Heart murmur   . 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 History of abscesses in groins 12/06/2010  . Hyperlipidemia    Hypertriglyceridemia 449 HDL 25  . Hypertension   . Migraines    "just on dialysis days"  . Peripheral neuropathy    related to DM  . Peripheral vascular disease ([place])    Tibial occlusive disease evaluated by Dr. Kellie Simmering in August 2011. Medical therapy  . Renal insufficiency    Dialysis since 2012  . Seizures (  [place])    "only when her sugar drops" (04/17/2017)  . Tobacco use disorder    Discontinued March 2012  . Trimalleolar fracture of ankle, closed 02/09/2015   Right     Past Surgical History: Past Surgical History:  Procedure Laterality Date  . ABDOMINAL AORTOGRAM W/LOWER EXTREMITY [name] 03/27/2017   Procedure: ABDOMINAL AORTOGRAM W/LOWER EXTREMITY;  Surgeon: Conrad Nielsville, MD;  Location: [place] [facility];  Service: Cardiovascular;  Laterality: [name];  . AV FISTULA PLACEMENT Left 04/2010   "lower arm"  . CARDIAC CATHETERIZATION [name] 02/10/2015   Procedure: Left Heart Cath and Coronary Angiography;  Surgeon: Dixie Dials, MD;  Location: [place] [facility];  Service: Cardiovascular;  Laterality: [name];  . CARDIAC CATHETERIZATION [name]  02/21/2016   Procedure: Right Heart Cath;  Surgeon: Jolaine Artist, MD;  Location: [place] [facility];  Service: Cardiovascular;  Laterality: [name];  . CATARACT EXTRACTION W/ INTRAOCULAR LENS  IMPLANT, BILATERAL Bilateral   . CERVICAL BIOPSY  W/ LOOP ELECTRODE EXCISION     h/o  . CERVICAL CONE BIOPSY     h/o  . COLPOSCOPY    . DILATION AND CURETTAGE OF UTERUS  2009  . ENDARTERECTOMY FEMORAL Right 04/16/2017   Procedure: RIGHT FEMORAL ENDARTERECTOMY;  Surgeon: Serafina Mitchell, MD;  Location: [place];  Service: Vascular;  Laterality: Right;  . FRACTURE SURGERY    . HERNIA REPAIR    . INCISION AND DRAINAGE ABSCESS     "groin"  . INSERTION OF DIALYSIS CATHETER Left 04/16/2017   Procedure: INSERTION of temporay DIALYSIS CATHETER, left femoral artery;  Surgeon: Serafina Mitchell, MD;  Location: [place];  Service: Vascular;  Laterality: Left;  . IR HYBRID TRAUMA EMBOLIZATION  04/16/2017  . ORIF ANKLE FRACTURE Right 02/16/2015   Procedure: OPEN REDUCTION INTERNAL FIXATION (ORIF) RIGHT ANKLE FRACTURE;  Surgeon: Altamese Ponce de Leon, MD;  Location: [place];  Service: Orthopedics;  Laterality: Right;  . PATCH ANGIOPLASTY Right 04/16/2017   Procedure: PATCH ANGIOPLASTY OF RIGHT FEMORAL ARTERY USING BOVINE PERICARDIUM PATCH;  Surgeon: Serafina Mitchell, MD;  Location: [facility];  Service: Vascular;  Laterality: Right;  . UMBILICAL HERNIA REPAIR  X 2     Family History: Family History  Problem Relation Age of Onset  . Diabetes Mother   . Hypertension Mother   . Diabetes Father   . Hyperlipidemia Father   . Hypertension Father   . Diabetes Sister   . Stroke Maternal Grandmother   . Diabetes Sister   . Breast cancer Maternal Aunt        Age 40's     Social History: Social History   Socioeconomic History  . Marital status: Married    Spouse name: Not on file  . Number of children: Not on file  . Years of education: Not on file  . Highest education level: Not on file  Social Needs  . Financial  resource strain: Not on file  . Food insecurity - worry: Not on file  . Food insecurity - inability: Not on file  . Transportation needs - medical: Not on file  . Transportation needs - non-medical: Not on file  Occupational History    Employer: [employer]  Tobacco Use  . Smoking status: Former Smoker    Packs/day: 0.30    Years: 10.00    Pack years: 3.00    Types: Cigarettes    Last attempt to quit: 08/05/2011    Years since quitting: 5.7  . Smokeless tobacco: Never Used  Substance and Sexual Activity  . Alcohol use: No  . Drug use: No  . Sexual activity: Not on file  Other Topics Concern  . Not on file  Social History Narrative  . Not on file     Review of Systems: Cannot provide she has a tracheostomy in place  Vital Signs: Temperature 98.1 pulse 74 respiration 17 blood pressure 171/91 Weight trends: There were no vitals filed for this visit.  Physical Exam: General: NAD, resting in bed  Head: Normocephalic, atraumatic.  Eyes: Anicteric, EOMI  Nose: NG in place  Throat: Oral mucosa moist  Neck: Tracheostomy in place  Lungs:  Normal respiratory effort. Scattered rhonchi bilateral  Heart: S1S2 no rubs  Abdomen:  BS normoactive. Soft, Nondistended, non-tender.  No masses or organomegaly.  Extremities: No pretibial edema.  Neurologic: Awake, alert, will follow simple commands  Skin: No visible rashes, scars.    Lab results: Basic Metabolic Panel: Recent Labs  Lab 04/24/17 0423  04/26/17 0233 04/27/17 0258 04/28/17 0022 04/29/17 0638  NA 134*   < > 135  136 135 136 135  K 3.8   < > 3.6  3.6 3.5 3.4* 3.1*  CL 94*   < > 97*  98* 96* 97* 92*  CO2 23   < > 24  26 25 24 26   GLUCOSE 150*   < > 197*  197* 144* 244* 144*  BUN 20   < > 28*  29* 40* 59* 37*  CREATININE 3.22*   < > 3.16*  3.21* 4.42* 5.43* 3.96*  CALCIUM 8.3*   < > 8.3*  8.3* 8.8* 8.8* 8.9  MG 2.0  --  1.9  --   --  2.4  PHOS 4.7*   < > 5.0*  5.1* 6.0* 7.0* 4.8*   < > = values in this  interval not displayed.    Liver Function Tests: Recent Labs  Lab 04/25/17 0330  04/27/17 0258 04/28/17 0022 04/29/17 0638  AST 24  --   --   --  21  ALT 36  --   --   --  19  ALKPHOS 255*  --   --   --  168*  BILITOT 0.9  --   --   --  0.9  PROT 5.8*  --   --   --  5.9*  ALBUMIN 1.9*  1.9*   < > 2.0* 2.0* 2.1*   < > = values in this interval not displayed.   No results for input(s): LIPASE, AMYLASE in the last 168 hours. No results for input(s): AMMONIA in the last 168 hours.  CBC: Recent Labs  Lab 04/25/17 0330 04/26/17 0233 04/27/17 0258 04/28/17 0022 04/29/17 0638  WBC 18.4* 14.9* 11.8* 11.5* 12.4*  NEUTROABS 15.4* 12.2* 9.2* 8.8* 10.2*  HGB 8.2* 8.2* 8.1* 8.1* 8.0*  HCT 26.6* 26.6* 27.1* 26.6* 26.2*  MCV 91.1 91.7 91.9 92.7 92.9  PLT 281 333 348 351 335    Cardiac Enzymes: No results for input(s): CKTOTAL, CKMB, CKMBINDEX, TROPONINI in the last 168 hours.  BNP: Invalid input(s): POCBNP  CBG: Recent Labs  Lab 04/28/17 0010 04/28/17 0527 04/28/17 0823 04/28/17 1200 04/28/17 1601  GLUCAP 240* 200* 188* 136* 153*    Microbiology: Results for orders placed or performed during the hospital encounter of 04/28/17  C difficile quick scan w PCR reflex     Status: None   Collection Time: 04/29/17  2:55 PM  Result Value Ref Range Status   C  Diff antigen NEGATIVE NEGATIVE Final   C Diff toxin NEGATIVE NEGATIVE Final   C Diff interpretation No C. difficile detected.  Final    Coagulation Studies: Recent Labs    04/29/17 0638  LABPROT 14.0  INR 1.09    Urinalysis: No results for input(s): COLORURINE, LABSPEC, PHURINE, GLUCOSEU, HGBUR, BILIRUBINUR, KETONESUR, PROTEINUR, UROBILINOGEN, NITRITE, LEUKOCYTESUR in the last 72 hours.  Invalid input(s): APPERANCEUR    Imaging: Dg Chest Port 1 View  Result Date: 04/29/2017 CLINICAL DATA:  Close trauma.  Displaced fracture of 2 ribs. EXAM: PORTABLE CHEST 1 VIEW COMPARISON:  04/26/2017 CXR FINDINGS: Stable  cardiomegaly with aortic atherosclerosis. Mild vascular congestion is noted without pneumonic consolidation, effusion or pneumothorax. Tracheostomy tube tip is centered over the upper trachea. Feeding tube extends below the left hemidiaphragm. No acute displaced appearing rib fractures are identified on current study. IMPRESSION: Stable cardiomegaly with mild pulmonary vascular congestion. Electronically Signed   By: Ashley Royalty M.D.   On: 04/29/2017 20:51   Dg Abd Portable 1v  Result Date: 04/30/2017 CLINICAL DATA:  41 year old female status post NG placement. EXAM: PORTABLE ABDOMEN - 1 VIEW COMPARISON:  Abdominal radiograph dated 04/29/2017 FINDINGS: A feeding tube is seen in similar position as prior radiograph. NG tube with tip in the body of the stomach and side-port likely in the proximal stomach or region of the gastroesophageal junction. Distended loops of air-filled small and large bowel similar to prior radiograph. No free air noted on the provided image. The osseous structures and soft tissues appear unremarkable. Vascular calcifications noted. IMPRESSION: No significant interval change in the positioning of the NG and feeding tube or distended appearance of the small and large bowel. Electronically Signed   By: Anner Crete M.D.   On: 04/30/2017 05:48   Dg Abd Portable 1v  Result Date: 04/29/2017 CLINICAL DATA:  Ileus. EXAM: PORTABLE ABDOMEN - 1 VIEW COMPARISON:  04/28/2017. FINDINGS: Feeding tube and NG tube in stable position. Distended loops of small and large bowel noted consistent with adynamic ileus. Aortoiliac and visceral atherosclerotic vascular calcification. No acute bony abnormality . IMPRESSION: Feeding tube and NG tube in stable position. Distended loops of small and large bowel noted consistent adynamic ileus . No interim change from prior exam. Electronically Signed   By: Marcello Moores  Register   On: 04/29/2017 08:47   Dg Abd Portable 1v  Result Date: 04/28/2017 CLINICAL DATA:   41 year old female NG tube placement. EXAM: PORTABLE ABDOMEN - 1 VIEW COMPARISON:  04/27/2017 and earlier. FINDINGS: Two portable Portable AP supine views at 2109 hours. NG type tube courses to the proximal stomach. Side hole is at the level of the proximal gastric body. Enteric feeding tube continues to course to the level of the ligament of Treitz, and tip is stable. Mildly increased gas-filled bowel loops in the abdomen since yesterday. No differential dilatation of bowel identified. Lung bases appears stable to mildly improved since 04/26/2017. IMPRESSION: 1. NG tube side good position at the level of the proximal gastric body. 2. Stable enteric feeding tube, tip at the ligament of Treitz level. 3. Mildly increased bowel gas since yesterday, upper limits of normal. Electronically Signed   By: Genevie Ann M.D.   On: 04/28/2017 21:44      Assessment & Plan: Pt is a 41 y.o. female with a PMHx of ESRD on HD MWF followed by [facility], hypertension, anemia of chronic kidney disease, secondary hyperparathyroidism, restrictive cardiomyopathy, coronary artery disease, peripheral vascular disease who was suffered cardiac arrest  on April 16, 2017 as well as April 18, 2017.  The patient suffered her first cardiac arrest during vascular procedure.  She is status post right femoral artery endarterectomy.  During her first cardiac arrest CPR was administered for 2 hours.  She now has ventilator dependent respiratory failure and has a tracheostomy in place.   1.  ESRD on HD MWF: Patient was followed by [facility] while at [facility].  We will plan to continue the patient on dialysis on Monday, Wednesday, Friday.  Dialysis orders to be prepared for today.  2.  Anemia of chronic kidney disease.  Hemoglobin currently 8.0.  We will reinitiate the patient on Aranesp 200 mcg subcutaneous weekly.  3.  Secondary hyperparathyroidism.  Patient currently not on binder therapy.  Follow-up PTH as well as  phosphorus today.  4.  Acute respiratory failure.  Tracheostomy in place.  Recommend pulmonary/critical care involvement.

## 2017-05-01 ENCOUNTER — Other Ambulatory Visit (HOSPITAL_COMMUNITY): Payer: Medicare Other

## 2017-05-01 DIAGNOSIS — R109 Unspecified abdominal pain: Secondary | ICD-10-CM | POA: Diagnosis not present

## 2017-05-01 LAB — HEPATITIS B CORE ANTIBODY, TOTAL: HEP B C TOTAL AB: NEGATIVE

## 2017-05-01 LAB — PARATHYROID HORMONE, INTACT (NO CA): PTH: 90 pg/mL — ABNORMAL HIGH (ref 15–65)

## 2017-05-01 LAB — HEPATITIS B SURFACE ANTIGEN: HEP B S AG: NEGATIVE

## 2017-05-01 LAB — HEPATITIS B SURFACE ANTIBODY,QUALITATIVE: Hep B S Ab: REACTIVE

## 2017-05-01 NOTE — Progress Notes (Signed)
  Progress Note    05/01/2017 9:28 AM   Subjective:  No complaints; says she was out of the bed to chair yesterday   Physical Exam: General:  No acute distress Lungs:  Trach in place Incisions:  Looks okay without drainage; some old bloody drainage around the insertion site of JP; right groin with firm hematoma   CBC    Component Value Date/Time   WBC 12.0 (H) 04/30/2017 1622   RBC 2.84 (L) 04/30/2017 1622   HGB 8.1 (L) 04/30/2017 1622   HCT 26.2 (L) 04/30/2017 1622   PLT 302 04/30/2017 1622   MCV 92.3 04/30/2017 1622   MCH 28.5 04/30/2017 1622   MCHC 30.9 04/30/2017 1622   RDW 18.2 (H) 04/30/2017 1622   LYMPHSABS 1.2 04/29/2017 0638   MONOABS 1.0 04/29/2017 0638   EOSABS 0.0 04/29/2017 0638   BASOSABS 0.0 04/29/2017 0638    BMET    Component Value Date/Time   NA 135 04/30/2017 1622   K 3.3 (L) 04/30/2017 1622   CL 95 (L) 04/30/2017 1622   CO2 24 04/30/2017 1622   GLUCOSE 180 (H) 04/30/2017 1622   BUN 42 (H) 04/30/2017 1622   CREATININE 5.58 (H) 04/30/2017 1622   CALCIUM 8.8 (L) 04/30/2017 1622   GFRNONAA 9 (L) 04/30/2017 1622   GFRAA 10 (L) 04/30/2017 1622    INR    Component Value Date/Time   INR 1.09 04/29/2017 8264      Assessment:  41 y.o. female is s/p:  R CFA, profunda, SFA, and EIA endarterectomy with patch angioplasty by Dr. Trula Slade suffered a cardiac arrest   Plan: -pt with 130cc serosanguinous drainage out from [place] -will leave drain in until decreased output -continue Keflex -Plans for surgical evaluation of the lymphatic drainage in the right groin and amputation of the toes will be performed at a later date, once the patient is more clinically stable per Dr. Zada Girt, PA-C [facility] [phone] 05/01/2017 9:28 AM

## 2017-05-02 ENCOUNTER — Other Ambulatory Visit (HOSPITAL_COMMUNITY): Payer: Medicare Other

## 2017-05-02 DIAGNOSIS — R143 Flatulence: Secondary | ICD-10-CM | POA: Diagnosis not present

## 2017-05-02 LAB — RENAL FUNCTION PANEL
Albumin: 2 g/dL — ABNORMAL LOW (ref 3.5–5.0)
Anion gap: 15 (ref 5–15)
BUN: 27 mg/dL — AB (ref 6–20)
CALCIUM: 8.9 mg/dL (ref 8.9–10.3)
CO2: 25 mmol/L (ref 22–32)
CREATININE: 4.86 mg/dL — AB (ref 0.44–1.00)
Chloride: 93 mmol/L — ABNORMAL LOW (ref 101–111)
GFR calc non Af Amer: 10 mL/min — ABNORMAL LOW (ref >60)
GFR, EST AFRICAN AMERICAN: 12 mL/min — AB (ref >60)
GLUCOSE: 169 mg/dL — AB (ref 65–99)
Phosphorus: 5.8 mg/dL — ABNORMAL HIGH (ref 2.5–4.6)
Potassium: 3.5 mmol/L (ref 3.5–5.1)
SODIUM: 133 mmol/L — AB (ref 135–145)

## 2017-05-02 LAB — CBC
HCT: 27.3 % — ABNORMAL LOW (ref 36.0–46.0)
Hemoglobin: 8.7 g/dL — ABNORMAL LOW (ref 12.0–15.0)
MCH: 29.5 pg (ref 26.0–34.0)
MCHC: 31.9 g/dL (ref 30.0–36.0)
MCV: 92.5 fL (ref 78.0–100.0)
PLATELETS: 317 10*3/uL (ref 150–400)
RBC: 2.95 MIL/uL — AB (ref 3.87–5.11)
RDW: 18.5 % — AB (ref 11.5–15.5)
WBC: 10.5 10*3/uL (ref 4.0–10.5)

## 2017-05-02 NOTE — Progress Notes (Addendum)
    Patient still ventilator supported. Right groin incision soft, JP output last 2 shifts 160 cc No change with toe wounds  Assessment:  41 y.o. female is s/p:  R CFA, profunda, SFA, and EIA endarterectomy with patch angioplasty by Dr. Trula Slade suffered a cardiac arrest  Maintain drain  Agree with above.  Incision looks good.  Dry gangrene of toes. D/c drain when less than 30 cc/d Most likely toe amp [place] next week  Ruta Hinds, MD [facility] [place] Office: [phone] Pager: [phone]

## 2017-05-02 NOTE — Progress Notes (Signed)
[facility]  ROUNDING NOTE   Subjective:  Patient resting comfortably in bed. She remains on the ventilator. Tracheostomy in place. She will be due for dialysis later today.   Objective:  Vital signs in last 24 hours:  Temperature 98.6 pulse 82 respirations 20 blood pressure 183/77  Physical Exam: General: No acute distress  Head: Normocephalic, atraumatic.  NG in place.  Eyes: Anicteric  Neck:  Tracheostomy in place.  Lungs:  Clear to auscultation, normal effort  Heart: S1S2 no rubs  Abdomen:  Soft, nontender, bowel sounds present  Extremities: Trace peripheral edema.  Neurologic: Awake, alert, following commands  Skin: No lesions  Access: LUE AVF    Basic Metabolic Panel: Recent Labs  Lab 04/26/17 0233 04/27/17 0258 04/28/17 0022 04/29/17 0638 04/30/17 1622 05/02/17 0453  NA 135  136 135 136 135 135 133*  K 3.6  3.6 3.5 3.4* 3.1* 3.3* 3.5  CL 97*  98* 96* 97* 92* 95* 93*  CO2 24  26 25 24 26 24 25   GLUCOSE 197*  197* 144* 244* 144* 180* 169*  BUN 28*  29* 40* 59* 37* 42* 27*  CREATININE 3.16*  3.21* 4.42* 5.43* 3.96* 5.58* 4.86*  CALCIUM 8.3*  8.3* 8.8* 8.8* 8.9 8.8* 8.9  MG 1.9  --   --  2.4  --   --   PHOS 5.0*  5.1* 6.0* 7.0* 4.8* 6.0* 5.8*    Liver Function Tests: Recent Labs  Lab 04/27/17 0258 04/28/17 0022 04/29/17 0638 04/30/17 1622 05/02/17 0453  AST  --   --  21  --   --   ALT  --   --  19  --   --   ALKPHOS  --   --  168*  --   --   BILITOT  --   --  0.9  --   --   PROT  --   --  5.9*  --   --   ALBUMIN 2.0* 2.0* 2.1* 2.1* 2.0*   No results for input(s): LIPASE, AMYLASE in the last 168 hours. No results for input(s): AMMONIA in the last 168 hours.  CBC: Recent Labs  Lab 04/26/17 0233 04/27/17 0258 04/28/17 0022 04/29/17 0638 04/30/17 1622 05/02/17 0453  WBC 14.9* 11.8* 11.5* 12.4* 12.0* 10.5  NEUTROABS 12.2* 9.2* 8.8* 10.2*  --   --   HGB 8.2* 8.1* 8.1* 8.0* 8.1* 8.7*  HCT 26.6* 27.1* 26.6* 26.2* 26.2*  27.3*  MCV 91.7 91.9 92.7 92.9 92.3 92.5  PLT 333 348 351 335 302 317    Cardiac Enzymes: No results for input(s): CKTOTAL, CKMB, CKMBINDEX, TROPONINI in the last 168 hours.  BNP: Invalid input(s): POCBNP  CBG: Recent Labs  Lab 04/28/17 0010 04/28/17 0527 04/28/17 0823 04/28/17 1200 04/28/17 1601  GLUCAP 240* 200* 188* 136* 153*    Microbiology: Results for orders placed or performed during the hospital encounter of 04/28/17  C difficile quick scan w PCR reflex     Status: None   Collection Time: 04/29/17  2:55 PM  Result Value Ref Range Status   C Diff antigen NEGATIVE NEGATIVE Final   C Diff toxin NEGATIVE NEGATIVE Final   C Diff interpretation No C. difficile detected.  Final    Coagulation Studies: No results for input(s): LABPROT, INR in the last 72 hours.  Urinalysis: No results for input(s): COLORURINE, LABSPEC, PHURINE, GLUCOSEU, HGBUR, BILIRUBINUR, KETONESUR, PROTEINUR, UROBILINOGEN, NITRITE, LEUKOCYTESUR in the last 72 hours.  Invalid input(s): APPERANCEUR  Imaging: Dg Abd Portable 1v  Result Date: 05/02/2017 CLINICAL DATA:  Abdominal pain. EXAM: PORTABLE ABDOMEN - 1 VIEW COMPARISON:  Radiographs of May 01, 2017. FINDINGS: Stable colonic dilatation is noted. Minimally dilated air-filled small bowel loops are noted on the left. Distal tip of feeding tube is seen in the expected position of the fourth portion of the duodenum or proximal jejunum. IMPRESSION: Stable colonic dilatation. Distal tip of feeding tube is seen in expected position of fourth portion of duodenum or proximal jejunum. Electronically Signed   By: Marijo Conception, M.D.   On: 05/02/2017 08:35   Dg Abd Portable 1v  Result Date: 05/01/2017 CLINICAL DATA:  Abdominal pain. EXAM: PORTABLE ABDOMEN - 1 VIEW COMPARISON:  04/30/2017 FINDINGS: Feeding tube remains with the tip in the transverse duodenum. It appears that the nasogastric tube has been removed. Stable appearance of ileus involving  primarily the colon. No visualized free air or pneumatosis. IMPRESSION: Feeding tube in transverse duodenum.  Stable ileus of colon. Electronically Signed   By: Aletta Edouard M.D.   On: 05/01/2017 08:18     Medications:       Assessment/ Plan:  41 y.o. female with a PMHx of ESRD on HD MWF followed by [facility], hypertension, anemia of chronic kidney disease, secondary hyperparathyroidism, restrictive cardiomyopathy, coronary artery disease, peripheral vascular disease who was suffered cardiac arrest on April 16, 2017 as well as April 18, 2017.  The patient suffered her first cardiac arrest during vascular procedure.  She is status post right femoral artery endarterectomy.  During her first cardiac arrest CPR was administered for 2 hours.  She now has ventilator dependent respiratory failure and has a tracheostomy in place.   1.  ESRD on HD MWF: Patient was followed by [facility] while at [facility].   -Patient due for hemodialysis today.  Orders have been prepared.  Thereafter next dialysis will be on Monday.  2.  Anemia of chronic kidney disease.    Hemoglobin up to 8.7.  Continue Aranesp 200 mcg subcutaneous weekly.  3.  Secondary hyperparathyroidism.    Phosphorus currently 5.8.  Continue to monitor.  Not on binder therapy at the moment.  4.  Acute respiratory failure.  Remains on the ventilator.  Await further pulmonary/critical care input.     LOS: 0 Tyara Dassow 12/7/20188:59 AM

## 2017-05-03 ENCOUNTER — Other Ambulatory Visit (HOSPITAL_COMMUNITY): Payer: Medicare Other

## 2017-05-03 DIAGNOSIS — R143 Flatulence: Secondary | ICD-10-CM | POA: Diagnosis not present

## 2017-05-03 LAB — BASIC METABOLIC PANEL
Anion gap: 12 (ref 5–15)
BUN: 22 mg/dL — AB (ref 6–20)
CALCIUM: 8.7 mg/dL — AB (ref 8.9–10.3)
CHLORIDE: 94 mmol/L — AB (ref 101–111)
CO2: 27 mmol/L (ref 22–32)
CREATININE: 4 mg/dL — AB (ref 0.44–1.00)
GFR calc Af Amer: 15 mL/min — ABNORMAL LOW (ref >60)
GFR calc non Af Amer: 13 mL/min — ABNORMAL LOW (ref >60)
GLUCOSE: 159 mg/dL — AB (ref 65–99)
Potassium: 3.5 mmol/L (ref 3.5–5.1)
Sodium: 133 mmol/L — ABNORMAL LOW (ref 135–145)

## 2017-05-03 LAB — CBC
HCT: 27.9 % — ABNORMAL LOW (ref 36.0–46.0)
Hemoglobin: 8.6 g/dL — ABNORMAL LOW (ref 12.0–15.0)
MCH: 28.7 pg (ref 26.0–34.0)
MCHC: 30.8 g/dL (ref 30.0–36.0)
MCV: 93 fL (ref 78.0–100.0)
PLATELETS: 314 10*3/uL (ref 150–400)
RBC: 3 MIL/uL — ABNORMAL LOW (ref 3.87–5.11)
RDW: 18.3 % — ABNORMAL HIGH (ref 11.5–15.5)
WBC: 9.9 10*3/uL (ref 4.0–10.5)

## 2017-05-04 ENCOUNTER — Other Ambulatory Visit (HOSPITAL_COMMUNITY): Payer: Medicare Other

## 2017-05-04 DIAGNOSIS — R143 Flatulence: Secondary | ICD-10-CM | POA: Diagnosis not present

## 2017-05-05 LAB — RENAL FUNCTION PANEL
Albumin: 1.9 g/dL — ABNORMAL LOW (ref 3.5–5.0)
Anion gap: 14 (ref 5–15)
BUN: 53 mg/dL — AB (ref 6–20)
CHLORIDE: 90 mmol/L — AB (ref 101–111)
CO2: 25 mmol/L (ref 22–32)
CREATININE: 6.1 mg/dL — AB (ref 0.44–1.00)
Calcium: 8.8 mg/dL — ABNORMAL LOW (ref 8.9–10.3)
GFR, EST AFRICAN AMERICAN: 9 mL/min — AB (ref >60)
GFR, EST NON AFRICAN AMERICAN: 8 mL/min — AB (ref >60)
Glucose, Bld: 160 mg/dL — ABNORMAL HIGH (ref 65–99)
POTASSIUM: 3.6 mmol/L (ref 3.5–5.1)
Phosphorus: 7.1 mg/dL — ABNORMAL HIGH (ref 2.5–4.6)
Sodium: 129 mmol/L — ABNORMAL LOW (ref 135–145)

## 2017-05-05 LAB — CBC
HEMATOCRIT: 27.1 % — AB (ref 36.0–46.0)
Hemoglobin: 8.4 g/dL — ABNORMAL LOW (ref 12.0–15.0)
MCH: 28.3 pg (ref 26.0–34.0)
MCHC: 31 g/dL (ref 30.0–36.0)
MCV: 91.2 fL (ref 78.0–100.0)
PLATELETS: 294 10*3/uL (ref 150–400)
RBC: 2.97 MIL/uL — ABNORMAL LOW (ref 3.87–5.11)
RDW: 17.8 % — AB (ref 11.5–15.5)
WBC: 11.1 10*3/uL — ABNORMAL HIGH (ref 4.0–10.5)

## 2017-05-05 NOTE — Progress Notes (Signed)
[facility]  ROUNDING NOTE   Subjective:  Patient seen and evaluated during hemodialysis today. She appears to be tolerating well. No cramping reported.   Objective:  Vital signs in last 24 hours:  Temperature 98.0 pulse 71 respirations 20 blood pressure 156/70  Physical Exam: General: No acute distress  Head: Normocephalic, atraumatic.  NG in place.  Eyes: Anicteric  Neck: Tracheostomy in place.  Lungs:  Clear to auscultation, normal effort  Heart: S1S2 no rubs  Abdomen:  Soft, nontender, bowel sounds present  Extremities: Trace peripheral edema.  Neurologic: Awake, alert, following commands  Skin: No lesions  Access: LUE AVF    Basic Metabolic Panel: Recent Labs  Lab 04/29/17 0638 04/30/17 1622 05/02/17 0453 05/03/17 0410 05/05/17 0549  NA 135 135 133* 133* 129*  K 3.1* 3.3* 3.5 3.5 3.6  CL 92* 95* 93* 94* 90*  CO2 26 24 25 27 25   GLUCOSE 144* 180* 169* 159* 160*  BUN 37* 42* 27* 22* 53*  CREATININE 3.96* 5.58* 4.86* 4.00* 6.10*  CALCIUM 8.9 8.8* 8.9 8.7* 8.8*  MG 2.4  --   --   --   --   PHOS 4.8* 6.0* 5.8*  --  7.1*    Liver Function Tests: Recent Labs  Lab 04/29/17 0638 04/30/17 1622 05/02/17 0453 05/05/17 0549  AST 21  --   --   --   ALT 19  --   --   --   ALKPHOS 168*  --   --   --   BILITOT 0.9  --   --   --   PROT 5.9*  --   --   --   ALBUMIN 2.1* 2.1* 2.0* 1.9*   No results for input(s): LIPASE, AMYLASE in the last 168 hours. No results for input(s): AMMONIA in the last 168 hours.  CBC: Recent Labs  Lab 04/29/17 0638 04/30/17 1622 05/02/17 0453 05/03/17 0410 05/05/17 0549  WBC 12.4* 12.0* 10.5 9.9 11.1*  NEUTROABS 10.2*  --   --   --   --   HGB 8.0* 8.1* 8.7* 8.6* 8.4*  HCT 26.2* 26.2* 27.3* 27.9* 27.1*  MCV 92.9 92.3 92.5 93.0 91.2  PLT 335 302 317 314 294    Cardiac Enzymes: No results for input(s): CKTOTAL, CKMB, CKMBINDEX, TROPONINI in the last 168 hours.  BNP: Invalid input(s): POCBNP  CBG: No results  for input(s): GLUCAP in the last 168 hours.  Microbiology: Results for orders placed or performed during the hospital encounter of 04/28/17  C difficile quick scan w PCR reflex     Status: None   Collection Time: 04/29/17  2:55 PM  Result Value Ref Range Status   C Diff antigen NEGATIVE NEGATIVE Final   C Diff toxin NEGATIVE NEGATIVE Final   C Diff interpretation No C. difficile detected.  Final    Coagulation Studies: No results for input(s): LABPROT, INR in the last 72 hours.  Urinalysis: No results for input(s): COLORURINE, LABSPEC, PHURINE, GLUCOSEU, HGBUR, BILIRUBINUR, KETONESUR, PROTEINUR, UROBILINOGEN, NITRITE, LEUKOCYTESUR in the last 72 hours.  Invalid input(s): APPERANCEUR    Imaging: Dg Abd Portable 1v  Result Date: 05/04/2017 CLINICAL DATA:  Ileus. EXAM: PORTABLE ABDOMEN - 1 VIEW COMPARISON:  05/03/2017 FINDINGS: Soft feeding tube tip is in the fourth portion of the duodenum. Moderate amount of small and large bowel gas for cysts, similar to yesterday's study. No new finding. IMPRESSION: Persistent moderate amount of small and large bowel gas, similar appearance to yesterday. Electronically Signed  By: Nelson Chimes M.D.   On: 05/04/2017 07:58     Medications:       Assessment/ Plan:  41 y.o. female with a PMHx of ESRD on HD MWF followed by [facility], hypertension, anemia of chronic kidney disease, secondary hyperparathyroidism, restrictive cardiomyopathy, coronary artery disease, peripheral vascular disease who was suffered cardiac arrest on April 16, 2017 as well as April 18, 2017.  The patient suffered her first cardiac arrest during vascular procedure.  She is status post right femoral artery endarterectomy.  During her first cardiac arrest CPR was administered for 2 hours.  She now has ventilator dependent respiratory failure and has a tracheostomy in place.   1.  ESRD on HD MWF: Patient was followed by [facility] while at [facility].    -Patient seen and evaluated during hemodialysis.  Appears to be tolerating well.  Complete dialysis today and plan for dialysis again on Wednesday.  2.  Anemia of chronic kidney disease.    Hemoglobin currently 8.4.  Continue maximum dosage and Aranesp 200 mcg subcutaneous weekly.  3.  Secondary hyperparathyroidism.    Phosphorus up to 7.1 but this is to be expected over the weekend.  Repeat serum phosphorus on Wednesday.  4.  Acute respiratory failure.  Management as per pulmonary/critical care.    LOS: 0 Ezra Denne 12/10/20184:34 PM

## 2017-05-05 NOTE — Progress Notes (Signed)
  Progress Note    05/05/2017 12:16 PM  Subjective:  No new complaints.  Still ventilated however able to communicate   There were no vitals filed for this visit. Physical Exam: Cardiac:  RRR Lungs:  ventilated Incisions:  R groin incision with skin edges approximated well; firm medial to incision; JP drain site with skin breakdown; JP output still 80cc clear, thin, yellowish discharge overnight Extremities:  3rd and 5th toes R foot with dry gangrene Abdomen:  Soft Neurologic: alert; moving all extremities well  CBC    Component Value Date/Time   WBC 11.1 (H) 05/05/2017 0549   RBC 2.97 (L) 05/05/2017 0549   HGB 8.4 (L) 05/05/2017 0549   HCT 27.1 (L) 05/05/2017 0549   PLT 294 05/05/2017 0549   MCV 91.2 05/05/2017 0549   MCH 28.3 05/05/2017 0549   MCHC 31.0 05/05/2017 0549   RDW 17.8 (H) 05/05/2017 0549   LYMPHSABS 1.2 04/29/2017 0638   MONOABS 1.0 04/29/2017 0638   EOSABS 0.0 04/29/2017 0638   BASOSABS 0.0 04/29/2017 0638    BMET    Component Value Date/Time   NA 129 (L) 05/05/2017 0549   K 3.6 05/05/2017 0549   CL 90 (L) 05/05/2017 0549   CO2 25 05/05/2017 0549   GLUCOSE 160 (H) 05/05/2017 0549   BUN 53 (H) 05/05/2017 0549   CREATININE 6.10 (H) 05/05/2017 0549   CALCIUM 8.8 (L) 05/05/2017 0549   GFRNONAA 8 (L) 05/05/2017 0549   GFRAA 9 (L) 05/05/2017 0549    INR    Component Value Date/Time   INR 1.09 04/29/2017 0638    No intake or output data in the 24 hours ending 05/05/17 1216   Assessment/Plan:  41 y.o. female is s/p R CFA, profunda, SFA, and EIA endarterectomy with patch angioplasty by Dr. Caesar Chestnut a cardiac arrest intra-operatively and post-operatively   Dry gangrene toes R foot JP output still >50cc per 24h interval Dr. Trula Slade considering amputation of R foot/toes and R groin surgical exploration     Dagoberto Ligas, PA-C [facility] [phone] 05/05/2017 12:16 PM

## 2017-05-06 ENCOUNTER — Other Ambulatory Visit: Payer: Self-pay | Admitting: *Deleted

## 2017-05-06 NOTE — Progress Notes (Addendum)
  Progress Note    05/06/2017 11:24 AM * No surgery found *  Subjective:  Patient denies pain in R foot.  Remains on ventilator   There were no vitals filed for this visit. Physical Exam: Lungs:  Mechanical ventilator Incisions:  R groin incision with firm seroma; tissue breakdown at drain site with surrounding erythema; gangrenous toes stable Abdomen:  Soft Neurologic: alert; moving extremities well  CBC    Component Value Date/Time   WBC 11.1 (H) 05/05/2017 0549   RBC 2.97 (L) 05/05/2017 0549   HGB 8.4 (L) 05/05/2017 0549   HCT 27.1 (L) 05/05/2017 0549   PLT 294 05/05/2017 0549   MCV 91.2 05/05/2017 0549   MCH 28.3 05/05/2017 0549   MCHC 31.0 05/05/2017 0549   RDW 17.8 (H) 05/05/2017 0549   LYMPHSABS 1.2 04/29/2017 0638   MONOABS 1.0 04/29/2017 0638   EOSABS 0.0 04/29/2017 0638   BASOSABS 0.0 04/29/2017 0638    BMET    Component Value Date/Time   NA 129 (L) 05/05/2017 0549   K 3.6 05/05/2017 0549   CL 90 (L) 05/05/2017 0549   CO2 25 05/05/2017 0549   GLUCOSE 160 (H) 05/05/2017 0549   BUN 53 (H) 05/05/2017 0549   CREATININE 6.10 (H) 05/05/2017 0549   CALCIUM 8.8 (L) 05/05/2017 0549   GFRNONAA 8 (L) 05/05/2017 0549   GFRAA 9 (L) 05/05/2017 0549    INR    Component Value Date/Time   INR 1.09 04/29/2017 0638    No intake or output data in the 24 hours ending 05/06/17 1124   Assessment/Plan: 41 y.o. female is s/p R CFA, profunda, SFA, and EIA endarterectomy with patch angioplasty by Dr. Caesar Chestnut a cardiac arrest intra-operatively and post-operatively  Stable gangrenous toes R foot JP drain site with firm fluid collection; d/c drain Dr. Trula Slade considering amputation of toes of R foot and R groin surgical exploration on Friday 05/09/17   Dagoberto Ligas, PA-C [facility] [phone] 05/06/2017 11:24 AM  Drain to be removed today Discussed right TMA Friday.  If she develops  Seroma, I will plan on right groin  exploration and possible angiography  Wells Kruti Horacek

## 2017-05-07 LAB — RENAL FUNCTION PANEL
ALBUMIN: 1.8 g/dL — AB (ref 3.5–5.0)
ANION GAP: 13 (ref 5–15)
BUN: 43 mg/dL — ABNORMAL HIGH (ref 6–20)
CALCIUM: 8.7 mg/dL — AB (ref 8.9–10.3)
CO2: 26 mmol/L (ref 22–32)
Chloride: 92 mmol/L — ABNORMAL LOW (ref 101–111)
Creatinine, Ser: 5.68 mg/dL — ABNORMAL HIGH (ref 0.44–1.00)
GFR calc non Af Amer: 8 mL/min — ABNORMAL LOW (ref >60)
GFR, EST AFRICAN AMERICAN: 10 mL/min — AB (ref >60)
Glucose, Bld: 158 mg/dL — ABNORMAL HIGH (ref 65–99)
PHOSPHORUS: 5.4 mg/dL — AB (ref 2.5–4.6)
Potassium: 3.7 mmol/L (ref 3.5–5.1)
SODIUM: 131 mmol/L — AB (ref 135–145)

## 2017-05-07 LAB — CBC
HCT: 24.8 % — ABNORMAL LOW (ref 36.0–46.0)
HEMOGLOBIN: 7.8 g/dL — AB (ref 12.0–15.0)
MCH: 29.1 pg (ref 26.0–34.0)
MCHC: 31.5 g/dL (ref 30.0–36.0)
MCV: 92.5 fL (ref 78.0–100.0)
Platelets: 257 10*3/uL (ref 150–400)
RBC: 2.68 MIL/uL — AB (ref 3.87–5.11)
RDW: 17.9 % — ABNORMAL HIGH (ref 11.5–15.5)
WBC: 11.9 10*3/uL — ABNORMAL HIGH (ref 4.0–10.5)

## 2017-05-07 NOTE — Progress Notes (Signed)
[facility]  ROUNDING NOTE   Subjective:  Patient had hemodialysis treatment early today secondary to pain in her feet. She is due for toe amputation on Friday.   Objective:  Vital signs in last 24 hours:  Temperature 98 pulse 72 respirations 30 blood pressure 164/80  Physical Exam: General: No acute distress  Head: Normocephalic, atraumatic.  NG in place.  Eyes: Anicteric  Neck: Tracheostomy in place.  Lungs:  Clear to auscultation, normal effort  Heart: S1S2 no rubs  Abdomen:  Soft, nontender, bowel sounds present  Extremities: Trace peripheral edema.  Neurologic: Awake, alert, following commands  Skin: No lesions  Access: LUE AVF    Basic Metabolic Panel: Recent Labs  Lab 05/02/17 0453 05/03/17 0410 05/05/17 0549 05/07/17 0829  NA 133* 133* 129* 131*  K 3.5 3.5 3.6 3.7  CL 93* 94* 90* 92*  CO2 25 27 25 26   GLUCOSE 169* 159* 160* 158*  BUN 27* 22* 53* 43*  CREATININE 4.86* 4.00* 6.10* 5.68*  CALCIUM 8.9 8.7* 8.8* 8.7*  PHOS 5.8*  --  7.1* 5.4*    Liver Function Tests: Recent Labs  Lab 05/02/17 0453 05/05/17 0549 05/07/17 0829  ALBUMIN 2.0* 1.9* 1.8*   No results for input(s): LIPASE, AMYLASE in the last 168 hours. No results for input(s): AMMONIA in the last 168 hours.  CBC: Recent Labs  Lab 05/02/17 0453 05/03/17 0410 05/05/17 0549 05/07/17 0829  WBC 10.5 9.9 11.1* 11.9*  HGB 8.7* 8.6* 8.4* 7.8*  HCT 27.3* 27.9* 27.1* 24.8*  MCV 92.5 93.0 91.2 92.5  PLT 317 314 294 257    Cardiac Enzymes: No results for input(s): CKTOTAL, CKMB, CKMBINDEX, TROPONINI in the last 168 hours.  BNP: Invalid input(s): POCBNP  CBG: No results for input(s): GLUCAP in the last 168 hours.  Microbiology: Results for orders placed or performed during the hospital encounter of 04/28/17  C difficile quick scan w PCR reflex     Status: None   Collection Time: 04/29/17  2:55 PM  Result Value Ref Range Status   C Diff antigen NEGATIVE NEGATIVE Final   C Diff toxin NEGATIVE NEGATIVE Final   C Diff interpretation No C. difficile detected.  Final    Coagulation Studies: No results for input(s): LABPROT, INR in the last 72 hours.  Urinalysis: No results for input(s): COLORURINE, LABSPEC, PHURINE, GLUCOSEU, HGBUR, BILIRUBINUR, KETONESUR, PROTEINUR, UROBILINOGEN, NITRITE, LEUKOCYTESUR in the last 72 hours.  Invalid input(s): APPERANCEUR    Imaging: No results found.   Medications:       Assessment/ Plan:  41 y.o. female with a PMHx of ESRD on HD MWF followed by [facility], hypertension, anemia of chronic kidney disease, secondary hyperparathyroidism, restrictive cardiomyopathy, coronary artery disease, peripheral vascular disease who was suffered cardiac arrest on April 16, 2017 as well as April 18, 2017.  The patient suffered her first cardiac arrest during vascular procedure.  She is status post right femoral artery endarterectomy.  During her first cardiac arrest CPR was administered for 2 hours.  She now has ventilator dependent respiratory failure and has a tracheostomy in place.   1.  ESRD on HD MWF: Patient was followed by [facility] while at [facility].   -Patient discontinued hemodialysis early today secondary to pain in her feet.  Ultrafiltration achieved was 0.2 kg.  We will plan for dialysis again on Friday.  2.  Anemia of chronic kidney disease.    Hemoglobin currently down to 7.8.  Patient will be maintained  on Aranesp 200 mcg subcutaneous weekly.  3.  Secondary hyperparathyroidism.    Phosphorus now under control at 5.4.  Continue to monitor.  4.  Acute respiratory failure.  Management as per pulmonary/critical care.    LOS: 0 [name] 12/12/20184:23 PM

## 2017-05-08 NOTE — H&P (View-Only) (Signed)
    Subjective  -   Complaining of pain in her right foot dialysis   Physical Exam:  Ischemic changes persist on the right foot with new areas of drainage.  The drain was removed yesterday and she does have some fluid reaccumulation in the groin.       Assessment/Plan:    I discussed with the patient going to the operating room tomorrow for transmetatarsal amputation of the right foot and reexploration of the groin.  Depending on how she tolerates the procedure and depending on the amount of bleeding at the amputation site, I will consider a second attempt at percutaneous revascularization of the right superficial femoral artery.  She will be n.p.o. after midnight.  Wells Kerstyn Coryell 05/08/2017 2:15 PM --  There were no vitals filed for this visit. No intake or output data in the 24 hours ending 05/08/17 1415   Laboratory CBC    Component Value Date/Time   WBC 11.9 (H) 05/07/2017 0829   HGB 7.8 (L) 05/07/2017 0829   HCT 24.8 (L) 05/07/2017 0829   PLT 257 05/07/2017 0829    BMET    Component Value Date/Time   NA 131 (L) 05/07/2017 0829   K 3.7 05/07/2017 0829   CL 92 (L) 05/07/2017 0829   CO2 26 05/07/2017 0829   GLUCOSE 158 (H) 05/07/2017 0829   BUN 43 (H) 05/07/2017 0829   CREATININE 5.68 (H) 05/07/2017 0829   CALCIUM 8.7 (L) 05/07/2017 0829   GFRNONAA 8 (L) 05/07/2017 0829   GFRAA 10 (L) 05/07/2017 0829    COAG Lab Results  Component Value Date   INR 1.09 04/29/2017   INR 1.20 04/22/2017   INR 1.31 04/18/2017   No results found for: PTT  Antibiotics Anti-infectives (From admission, onward)   None       V. Leia Alf, M.D. [facility] [place] [facility] Office: [phone] Pager:  [phone]

## 2017-05-08 NOTE — Progress Notes (Signed)
    Subjective  -   Complaining of pain in her right foot dialysis   Physical Exam:  Ischemic changes persist on the right foot with new areas of drainage.  The drain was removed yesterday and she does have some fluid reaccumulation in the groin.       Assessment/Plan:    I discussed with the patient going to the operating room tomorrow for transmetatarsal amputation of the right foot and reexploration of the groin.  Depending on how she tolerates the procedure and depending on the amount of bleeding at the amputation site, I will consider a second attempt at percutaneous revascularization of the right superficial femoral artery.  She will be n.p.o. after midnight.  [name] 05/08/2017 2:15 PM --  There were no vitals filed for this visit. No intake or output data in the 24 hours ending 05/08/17 1415   Laboratory CBC    Component Value Date/Time   WBC 11.9 (H) 05/07/2017 0829   HGB 7.8 (L) 05/07/2017 0829   HCT 24.8 (L) 05/07/2017 0829   PLT 257 05/07/2017 0829    BMET    Component Value Date/Time   NA 131 (L) 05/07/2017 0829   K 3.7 05/07/2017 0829   CL 92 (L) 05/07/2017 0829   CO2 26 05/07/2017 0829   GLUCOSE 158 (H) 05/07/2017 0829   BUN 43 (H) 05/07/2017 0829   CREATININE 5.68 (H) 05/07/2017 0829   CALCIUM 8.7 (L) 05/07/2017 0829   GFRNONAA 8 (L) 05/07/2017 0829   GFRAA 10 (L) 05/07/2017 0829    COAG Lab Results  Component Value Date   INR 1.09 04/29/2017   INR 1.20 04/22/2017   INR 1.31 04/18/2017   No results found for: PTT  Antibiotics Anti-infectives (From admission, onward)   None       V. Leia Alf, M.D. [facility] [place] Office: [phone] Pager:  [phone]

## 2017-05-09 ENCOUNTER — Encounter (HOSPITAL_COMMUNITY): Payer: Medicare Other | Admitting: Anesthesiology

## 2017-05-09 ENCOUNTER — Encounter: Payer: Self-pay | Admitting: Certified Registered Nurse Anesthetist

## 2017-05-09 ENCOUNTER — Encounter
Admission: RE | Disposition: A | Discharge status: Skilled Nursing Facility | Payer: Self-pay | Source: Other Acute Inpatient Hospital | Attending: Internal Medicine

## 2017-05-09 DIAGNOSIS — I96 Gangrene, not elsewhere classified: Secondary | ICD-10-CM

## 2017-05-09 HISTORY — PX: AMPUTATION: SHX166

## 2017-05-09 HISTORY — PX: WOUND EXPLORATION: SHX6188

## 2017-05-09 LAB — PROTIME-INR
INR: 1.12
Prothrombin Time: 14.3 seconds (ref 11.4–15.2)

## 2017-05-09 LAB — RENAL FUNCTION PANEL
ANION GAP: 13 (ref 5–15)
Albumin: 1.9 g/dL — ABNORMAL LOW (ref 3.5–5.0)
BUN: 46 mg/dL — ABNORMAL HIGH (ref 6–20)
CALCIUM: 8.8 mg/dL — AB (ref 8.9–10.3)
CHLORIDE: 92 mmol/L — AB (ref 101–111)
CO2: 25 mmol/L (ref 22–32)
Creatinine, Ser: 6.31 mg/dL — ABNORMAL HIGH (ref 0.44–1.00)
GFR calc non Af Amer: 7 mL/min — ABNORMAL LOW (ref >60)
GFR, EST AFRICAN AMERICAN: 9 mL/min — AB (ref >60)
Glucose, Bld: 231 mg/dL — ABNORMAL HIGH (ref 65–99)
Phosphorus: 4.8 mg/dL — ABNORMAL HIGH (ref 2.5–4.6)
Potassium: 3.9 mmol/L (ref 3.5–5.1)
Sodium: 130 mmol/L — ABNORMAL LOW (ref 135–145)

## 2017-05-09 LAB — CBC
HCT: 27.3 % — ABNORMAL LOW (ref 36.0–46.0)
HEMATOCRIT: 23.9 % — AB (ref 36.0–46.0)
HEMOGLOBIN: 8.5 g/dL — AB (ref 12.0–15.0)
Hemoglobin: 7.6 g/dL — ABNORMAL LOW (ref 12.0–15.0)
MCH: 28.2 pg (ref 26.0–34.0)
MCH: 29 pg (ref 26.0–34.0)
MCHC: 31.1 g/dL (ref 30.0–36.0)
MCHC: 31.8 g/dL (ref 30.0–36.0)
MCV: 90.7 fL (ref 78.0–100.0)
MCV: 91.2 fL (ref 78.0–100.0)
PLATELETS: 256 10*3/uL (ref 150–400)
Platelets: 258 10*3/uL (ref 150–400)
RBC: 3.01 MIL/uL — AB (ref 3.87–5.11)
RBC: 2.62 MIL/uL — AB (ref 3.87–5.11)
RDW: 17.4 % — ABNORMAL HIGH (ref 11.5–15.5)
RDW: 17.3 % — ABNORMAL HIGH (ref 11.5–15.5)
WBC: 10.4 10*3/uL (ref 4.0–10.5)
WBC: 9.5 10*3/uL (ref 4.0–10.5)

## 2017-05-09 SURGERY — AMPUTATION DIGIT
Anesthesia: General | Laterality: Right

## 2017-05-09 MED ORDER — MIDAZOLAM HCL 2 MG/2ML IJ SOLN
INTRAMUSCULAR | Status: AC
  Filled 2017-05-09: qty 2

## 2017-05-09 MED ORDER — MIDAZOLAM HCL 5 MG/5ML IJ SOLN
INTRAMUSCULAR | Status: DC | PRN
  Administered 2017-05-09 (×2): 1 mg via INTRAVENOUS

## 2017-05-09 MED ORDER — LIDOCAINE HCL (PF) 1 % IJ SOLN
INTRAMUSCULAR | Status: DC | PRN
  Administered 2017-05-09: 30 mL

## 2017-05-09 MED ORDER — DIPHENHYDRAMINE HCL 50 MG/ML IJ SOLN
INTRAMUSCULAR | Status: DC | PRN
  Administered 2017-05-09: 12.5 mg via INTRAVENOUS

## 2017-05-09 MED ORDER — SODIUM CHLORIDE 0.9 % IV SOLN
INTRAVENOUS | Status: DC | PRN
  Administered 2017-05-09: 08:00:00 500 mL

## 2017-05-09 MED ORDER — FENTANYL CITRATE (PF) 250 MCG/5ML IJ SOLN
INTRAMUSCULAR | Status: AC
  Filled 2017-05-09: qty 5

## 2017-05-09 MED ORDER — SODIUM CHLORIDE 0.9 % IV SOLN
INTRAVENOUS | Status: DC | PRN
  Administered 2017-05-09: 08:00:00 via INTRAVENOUS

## 2017-05-09 MED ORDER — SODIUM CHLORIDE 0.9 % IJ SOLN
INTRAVENOUS | Status: DC | PRN
  Administered 2017-05-09: 150 mL via INTRAMUSCULAR

## 2017-05-09 MED ORDER — ONDANSETRON HCL 4 MG/2ML IJ SOLN
INTRAMUSCULAR | Status: DC | PRN
  Administered 2017-05-09: 4 mg via INTRAVENOUS

## 2017-05-09 MED ORDER — FENTANYL CITRATE (PF) 100 MCG/2ML IJ SOLN
INTRAMUSCULAR | Status: DC | PRN
  Administered 2017-05-09: 50 ug via INTRAVENOUS
  Administered 2017-05-09 (×2): 25 ug via INTRAVENOUS
  Administered 2017-05-09: 50 ug via INTRAVENOUS

## 2017-05-09 MED ORDER — VANCOMYCIN HCL 1000 MG IV SOLR
INTRAVENOUS | Status: DC | PRN
  Administered 2017-05-09: 1000 mg via INTRAVENOUS

## 2017-05-09 MED ORDER — PROPOFOL 10 MG/ML IV BOLUS
INTRAVENOUS | Status: AC
  Filled 2017-05-09: qty 20

## 2017-05-09 MED ORDER — 0.9 % SODIUM CHLORIDE (POUR BTL) OPTIME
TOPICAL | Status: DC | PRN
  Administered 2017-05-09: 1000 mL

## 2017-05-09 SURGICAL SUPPLY — 76 items
ADH SKN CLS APL DERMABOND .7 (GAUZE/BANDAGES/DRESSINGS) ×2
BAG BANDED W/RUBBER/TAPE 36X54 (MISCELLANEOUS) ×4 IMPLANT
BAG EQP BAND 135X91 W/RBR TAPE (MISCELLANEOUS) ×2
BAG SNAP BAND KOVER 36X36 (MISCELLANEOUS) ×4 IMPLANT
BANDAGE ACE 4X5 VEL STRL LF (GAUZE/BANDAGES/DRESSINGS) ×4 IMPLANT
BLADE AVERAGE 25MMX9MM (BLADE) ×1
BLADE AVERAGE 25X9 (BLADE) ×3 IMPLANT
BLADE SAW SGTL 81X20 HD (BLADE) ×3 IMPLANT
BLADE SURG 10 STRL SS (BLADE) ×3 IMPLANT
BLADE SURG 11 STRL SS (BLADE) ×4 IMPLANT
BNDG GAUZE ELAST 4 BULKY (GAUZE/BANDAGES/DRESSINGS) ×4 IMPLANT
CANISTER SUCT 3000ML PPV (MISCELLANEOUS) ×4 IMPLANT
CATH ANGIO 5F BER 65CM (CATHETERS) IMPLANT
CATH OMNI FLUSH .035X70CM (CATHETERS) ×4 IMPLANT
COVER BACK TABLE 80X110 HD (DRAPES) ×8 IMPLANT
COVER DOME SNAP 22 D (MISCELLANEOUS) ×4 IMPLANT
COVER PROBE W GEL 5X96 (DRAPES) ×4 IMPLANT
COVER SURGICAL LIGHT HANDLE (MISCELLANEOUS) ×4 IMPLANT
DECANTER SPIKE VIAL GLASS SM (MISCELLANEOUS) ×3 IMPLANT
DERMABOND ADVANCED (GAUZE/BANDAGES/DRESSINGS) ×2
DERMABOND ADVANCED .7 DNX12 (GAUZE/BANDAGES/DRESSINGS) ×2 IMPLANT
DEVICE TORQUE H2O (MISCELLANEOUS) IMPLANT
DRAPE EXTREMITY T 121X128X90 (DRAPE) ×4 IMPLANT
DRAPE FEMORAL ANGIO 80X135IN (DRAPES) ×4 IMPLANT
DRAPE HALF SHEET 40X57 (DRAPES) ×4 IMPLANT
DRSG ADAPTIC 3X8 NADH LF (GAUZE/BANDAGES/DRESSINGS) ×3 IMPLANT
DRSG PAD ABDOMINAL 8X10 ST (GAUZE/BANDAGES/DRESSINGS) ×3 IMPLANT
DRSG VAC ATS MED SENSATRAC (GAUZE/BANDAGES/DRESSINGS) ×3 IMPLANT
ELECT REM PT RETURN 9FT ADLT (ELECTROSURGICAL) ×4
ELECTRODE REM PT RTRN 9FT ADLT (ELECTROSURGICAL) ×2 IMPLANT
GAUZE SPONGE 4X4 12PLY STRL (GAUZE/BANDAGES/DRESSINGS) ×4 IMPLANT
GAUZE SPONGE 4X4 12PLY STRL LF (GAUZE/BANDAGES/DRESSINGS) ×3 IMPLANT
GAUZE SPONGE 4X4 16PLY XRAY LF (GAUZE/BANDAGES/DRESSINGS) ×4 IMPLANT
GLOVE BIOGEL PI IND STRL 7.5 (GLOVE) ×3 IMPLANT
GLOVE BIOGEL PI INDICATOR 7.5 (GLOVE) ×4
GLOVE ECLIPSE 7.0 STRL STRAW (GLOVE) ×3 IMPLANT
GLOVE SURG SS PI 7.5 STRL IVOR (GLOVE) ×4 IMPLANT
GOWN STRL REUS W/ TWL LRG LVL3 (GOWN DISPOSABLE) ×4 IMPLANT
GOWN STRL REUS W/ TWL XL LVL3 (GOWN DISPOSABLE) ×2 IMPLANT
GOWN STRL REUS W/TWL LRG LVL3 (GOWN DISPOSABLE) ×8
GOWN STRL REUS W/TWL XL LVL3 (GOWN DISPOSABLE) ×4
GUIDEWIRE ANGLED .035X150CM (WIRE) IMPLANT
HEMOSTAT SNOW SURGICEL 2X4 (HEMOSTASIS) IMPLANT
KIT BASIN OR (CUSTOM PROCEDURE TRAY) ×4 IMPLANT
KIT ROOM TURNOVER OR (KITS) ×4 IMPLANT
NDL HYPO 25GX1X1/2 BEV (NEEDLE) IMPLANT
NDL PERC 18GX7CM (NEEDLE) ×1 IMPLANT
NEEDLE HYPO 25GX1X1/2 BEV (NEEDLE) IMPLANT
NEEDLE PERC 18GX7CM (NEEDLE) ×4 IMPLANT
NS IRRIG 1000ML POUR BTL (IV SOLUTION) ×4 IMPLANT
PACK GENERAL/GYN (CUSTOM PROCEDURE TRAY) ×4 IMPLANT
PACK PERIPHERAL VASCULAR (CUSTOM PROCEDURE TRAY) IMPLANT
PAD ARMBOARD 7.5X6 YLW CONV (MISCELLANEOUS) ×8 IMPLANT
PROTECTION STATION PRESSURIZED (MISCELLANEOUS) ×4
SET MICROPUNCTURE 5F STIFF (MISCELLANEOUS) ×3 IMPLANT
SHEATH AVANTI 11CM 5FR (MISCELLANEOUS) ×4 IMPLANT
SHEATH PINNACLE 5F 10CM (SHEATH) ×3 IMPLANT
STATION PROTECTION PRESSURIZED (MISCELLANEOUS) ×2 IMPLANT
STOPCOCK MORSE 400PSI 3WAY (MISCELLANEOUS) ×3 IMPLANT
SUT ETHILON 3 0 PS 1 (SUTURE) ×16 IMPLANT
SUT VIC AB 2-0 CT1 27 (SUTURE) ×8
SUT VIC AB 2-0 CT1 TAPERPNT 27 (SUTURE) ×2 IMPLANT
SUT VIC AB 3-0 SH 27 (SUTURE) ×8
SUT VIC AB 3-0 SH 27X BRD (SUTURE) ×2 IMPLANT
SUT VIC AB 3-0 SH 8-18 (SUTURE) ×3 IMPLANT
SYR 10ML LL (SYRINGE) ×12 IMPLANT
SYR 20CC LL (SYRINGE) ×8 IMPLANT
SYR 30ML LL (SYRINGE) ×4 IMPLANT
SYR CONTROL 10ML LL (SYRINGE) IMPLANT
SYR MEDRAD MARK V 150ML (SYRINGE) IMPLANT
TOWEL GREEN STERILE (TOWEL DISPOSABLE) ×8 IMPLANT
TUBING HIGH PRESSURE 120CM (CONNECTOR) ×3 IMPLANT
UNDERPAD 30X30 (UNDERPADS AND DIAPERS) ×4 IMPLANT
WATER STERILE IRR 1000ML POUR (IV SOLUTION) ×4 IMPLANT
WIRE BENTSON .035X145CM (WIRE) ×7 IMPLANT
WND VAC CANISTER 500ML (MISCELLANEOUS) ×3 IMPLANT

## 2017-05-09 NOTE — Interval H&P Note (Signed)
History and Physical Interval Note:  05/09/2017 7:22 AM  [name]  has presented today for surgery, with the diagnosis of PERIPHERAL VASCULAR DISEASE WITH GANGRENE OF TOES RIGHT FOOT.  The various methods of treatment have been discussed with the patient and family. After consideration of risks, benefits and other options for treatment, the patient has consented to  Procedure(s): AMPUTATION THIRD AND FIFTH TOES RIGHT FOOT (Right) EXPLORATION RIGHT GROIN (Right) AORTOGRAM ([name]) as a surgical intervention .  The patient's history has been reviewed, patient examined, no change in status, stable for surgery.  I have reviewed the patient's chart and labs.  Questions were answered to the patient's satisfaction.     [name]

## 2017-05-09 NOTE — Anesthesia Procedure Notes (Signed)
Spinal  Patient location during procedure: OR Start time: 05/09/2017 8:05 AM End time: 05/09/2017 8:10 AM Staffing Anesthesiologist: Riedl Gaudy, MD Performed: anesthesiologist  Preanesthetic Checklist Completed: patient identified, site marked, surgical consent, pre-op evaluation, timeout performed, IV checked, risks and benefits discussed and monitors and equipment checked Spinal Block Patient position: sitting Prep: Betadine Patient monitoring: heart rate, cardiac monitor, continuous pulse ox and blood pressure Approach: midline Location: L3-4 Injection technique: single-shot Needle Needle type: Pencan  Needle gauge: 24 G Assessment Sensory level: T6 Additional Notes 14 mg 0.75% Bupivacaine injected easily

## 2017-05-09 NOTE — Anesthesia Postprocedure Evaluation (Signed)
Anesthesia Post Note  Patient: [name]  Procedure(s) Performed: AMPUTATION TRANSMETATARSAL OF RIGHT FOOT (Right ) EXPLORATION RIGHT GROIN (Right )     Patient location during evaluation: Other [facility]) Anesthesia Type: General Level of consciousness: awake and alert Pain management: pain level controlled Vital Signs Assessment: post-procedure vital signs reviewed and stable Respiratory status: spontaneous breathing and respiratory function stable Cardiovascular status: blood pressure returned to baseline and stable Postop Assessment: spinal receding Anesthetic complications: no    Last Vitals: There were no vitals filed for this visit.  Last Pain: There were no vitals filed for this visit.               Shawntell Dixson,W. EDMOND

## 2017-05-09 NOTE — Op Note (Signed)
    Patient name: [name] MRN: [id number] DOB: 1975-12-30 Sex: female  05/09/2017 Pre-operative Diagnosis: Right toe wound Post-operative diagnosis:  Same Surgeon:  [name] Assistants: Gerri Lins Procedure:   #1: Right transmetatarsal amputation   #2: Exploration of right groin with evacuation of seroma and application of wound VAC. Anesthesia: Spinal Blood Loss:  See anesthesia record Specimens: Forefoot  Findings: Patient had good capillary bleeding from the amputation site, however on the dorsum of the foot, subcutaneous tissue did not appear to be healthy.  Patient has undergone a right femoral endarterectomy.  This was complicated by cardiac arrest requiring prolonged  Indications: Chest compressions.  She has developed worsening gangrene of her foot and needs to have amputation performed.  In addition she has had large volume drainage from her right groin and needs right groin exploration.  Procedure:  The patient was identified in the holding area and taken to [place] [facility]  The patient was then placed supine on the table. spinal anesthesia was administered.  The patient was prepped and draped in the usual sterile fashion.  A time out was called and antibiotics were administered.  A fishmouth incision was made in the forefoot with a 10 blade, taking the incision down to the bone.  Periosteal elevators were used to elevate the periosteum.  An oscillating saw was used to perfor transection of the bone.  A rasp was used to smooth the bone surface.  I ended up resecting additional skin because of the appearance of the subcutaneous tissue.  This was dishwater colored.  There was however good capillary bleeding from the surrounding tissue.  The wound was irrigated.  I reapproximated the fascia with interrupted 3-0 Vicryl and closed the skin with 4-0 nylon vertical mattress sutures.  A sterile dressing was applied.  Attention was then turned towards the right groin.  A 10 blade  was used to open the previous incision.  The patient did have some discomfort with this and so 20 cc of 1% lidocaine was used.  There was significant scar tissue in the groin.  The artery was covered with tissue and not exposed.  The patient did have a cavity on the superior aspect of her wound which was opened.  I then irrigated the wound heparinized saline.  I placed several interrupted 2-0 Vicryl sutures to close down the potential space in the superior aspect of the incision.  A wound VAC was then placed.  The patient tolerated the procedure well there were no immediate complications.   Disposition: To PACU stable   V. [name], M.D. [facility] [place] Office: [phone] Pager:  [phone]

## 2017-05-09 NOTE — Transfer of Care (Signed)
Immediate Anesthesia Transfer of Care Note  Patient: [name]  Procedure(s) Performed: AMPUTATION TRANSMETATARSAL OF RIGHT FOOT (Right ) EXPLORATION RIGHT GROIN (Right )  Patient Location: [facility]  Anesthesia Type:MAC combined with regional for post-op pain  Level of Consciousness: awake, alert , oriented and patient cooperative  Airway & Oxygen Therapy: Patient placed on Ventilator (see vital sign flow sheet for setting) and via trach  Post-op Assessment: Report given to RN and Post -op Vital signs reviewed and stable  Post vital signs: Reviewed and stable  Last Vitals: There were no vitals filed for this visit.  Last Pain: There were no vitals filed for this visit.       Complications: No apparent anesthesia complications

## 2017-05-09 NOTE — Anesthesia Preprocedure Evaluation (Addendum)
Anesthesia Evaluation  Patient identified by MRN, date of birth, ID band Patient awake    Reviewed: Allergy & Precautions, H&P , NPO status , Patient's Chart, lab work & pertinent test results  Airway Mallampati: II  TM Distance: >3 FB Neck ROM: Full    Dental no notable dental hx. (+) Teeth Intact, Dental Advisory Given   Pulmonary neg pulmonary ROS, former smoker,    Pulmonary exam normal breath sounds clear to auscultation       Cardiovascular hypertension, Pt. on medications and Pt. on home beta blockers + CAD, + Peripheral Vascular Disease and +CHF  + Valvular Problems/Murmurs AS  Rhythm:Regular Rate:Normal     Neuro/Psych  Headaches, Seizures -, Well Controlled,  negative psych ROS   GI/Hepatic negative GI ROS, Neg liver ROS,   Endo/Other  diabetes, Insulin Dependent  Renal/GU ESRF and DialysisRenal disease  negative genitourinary   Musculoskeletal  (+) Arthritis , Osteoarthritis,    Abdominal   Peds  Hematology negative hematology ROS (+) anemia ,   Anesthesia Other Findings   Reproductive/Obstetrics negative OB ROS                            Anesthesia Physical Anesthesia Plan  ASA: III  Anesthesia Plan: Spinal   Post-op Pain Management:    Induction: Intravenous  PONV Risk Score and Plan: 4 or greater and Ondansetron and Midazolam  Airway Management Planned: Tracheostomy  Additional Equipment:   Intra-op Plan:   Post-operative Plan:   Informed Consent: I have reviewed the patients History and Physical, chart, labs and discussed the procedure including the risks, benefits and alternatives for the proposed anesthesia with the patient or authorized representative who has indicated his/her understanding and acceptance.   Dental advisory given  Plan Discussed with: CRNA  Anesthesia Plan Comments:        Anesthesia Quick Evaluation

## 2017-05-09 NOTE — Progress Notes (Signed)
[facility]  ROUNDING NOTE   Subjective:  Patient had transmetatarsal amputation of the right foot today. She will be due for dialysis later today.     Objective:  Vital signs in last 24 hours:  Pulse 75 Respirations 18   Physical Exam: General: No acute distress  Head: Normocephalic, atraumatic.  NG in place.  Eyes: Anicteric  Neck: Tracheostomy in place.  Lungs:  Clear to auscultation, normal effort  Heart: S1S2 no rubs  Abdomen:  Soft, nontender, bowel sounds present  Extremities: Trace peripheral edema.  Neurologic: Awake, alert, following commands  Skin: No lesions  Access: LUE AVF    Basic Metabolic Panel: Recent Labs  Lab 05/03/17 0410 05/05/17 0549 05/07/17 0829 05/09/17 0627  NA 133* 129* 131* 130*  K 3.5 3.6 3.7 3.9  CL 94* 90* 92* 92*  CO2 27 25 26 25   GLUCOSE 159* 160* 158* 231*  BUN 22* 53* 43* 46*  CREATININE 4.00* 6.10* 5.68* 6.31*  CALCIUM 8.7* 8.8* 8.7* 8.8*  PHOS  --  7.1* 5.4* 4.8*    Liver Function Tests: Recent Labs  Lab 05/05/17 0549 05/07/17 0829 05/09/17 0627  ALBUMIN 1.9* 1.8* 1.9*   No results for input(s): LIPASE, AMYLASE in the last 168 hours. No results for input(s): AMMONIA in the last 168 hours.  CBC: Recent Labs  Lab 05/03/17 0410 05/05/17 0549 05/07/17 0829 05/09/17 0627 05/09/17 1159  WBC 9.9 11.1* 11.9* 10.4 9.5  HGB 8.6* 8.4* 7.8* 8.5* 7.6*  HCT 27.9* 27.1* 24.8* 27.3* 23.9*  MCV 93.0 91.2 92.5 90.7 91.2  PLT 314 294 257 258 256    Cardiac Enzymes: No results for input(s): CKTOTAL, CKMB, CKMBINDEX, TROPONINI in the last 168 hours.  BNP: Invalid input(s): POCBNP  CBG: No results for input(s): GLUCAP in the last 168 hours.  Microbiology: Results for orders placed or performed during the hospital encounter of 04/28/17  C difficile quick scan w PCR reflex     Status: None   Collection Time: 04/29/17  2:55 PM  Result Value Ref Range Status   C Diff antigen NEGATIVE NEGATIVE Final   C Diff  toxin NEGATIVE NEGATIVE Final   C Diff interpretation No C. difficile detected.  Final    Coagulation Studies: Recent Labs    05/09/17 0627  LABPROT 14.3  INR 1.12    Urinalysis: No results for input(s): COLORURINE, LABSPEC, PHURINE, GLUCOSEU, HGBUR, BILIRUBINUR, KETONESUR, PROTEINUR, UROBILINOGEN, NITRITE, LEUKOCYTESUR in the last 72 hours.  Invalid input(s): APPERANCEUR    Imaging: No results found.   Medications:       Assessment/ Plan:  41 y.o. female with a PMHx of ESRD on HD MWF followed by [facility], hypertension, anemia of chronic kidney disease, secondary hyperparathyroidism, restrictive cardiomyopathy, coronary artery disease, peripheral vascular disease who was suffered cardiac arrest on April 16, 2017 as well as April 18, 2017.  The patient suffered her first cardiac arrest during vascular procedure.  She is status post right femoral artery endarterectomy.  During her first cardiac arrest CPR was administered for 2 hours.  She now has ventilator dependent respiratory failure and has a tracheostomy in place.   1.  ESRD on HD MWF: Patient was followed by [facility] while at [facility].   -Patient will have hemodialysis later today as scheduled.  Thereafter she will have her next dialysis on Monday.  2.  Anemia of chronic kidney disease.    Hemoglobin continues to drift down and is currently 7.6.  She  is on maximum dosage of Aranesp 200 mcg subcutaneous weekly.  In addition we recommend continued monitoring of CBC and consideration of blood transfusion if hemoglobin becomes 7 or less.  3.  Secondary hyperparathyroidism.    Phosphorus under good control at 4.8.  Recheck on Monday.  4.  Acute respiratory failure.  Management as per pulmonary/critical care.    LOS: 0 Kaysi Ourada 12/14/20184:13 PM

## 2017-05-09 NOTE — Anesthesia Procedure Notes (Signed)
Procedure Name: MAC Date/Time: 05/09/2017 7:55 AM Performed by: White, Amedeo Plenty, CRNA Pre-anesthesia Checklist: Patient identified, Emergency Drugs available, Suction available and Patient being monitored Patient Re-evaluated:Patient Re-evaluated prior to induction Oxygen Delivery Method: Circle system utilized Induction Type: Tracheostomy

## 2017-05-10 LAB — CBC
HCT: 19.3 % — ABNORMAL LOW (ref 36.0–46.0)
HEMATOCRIT: 21.6 % — AB (ref 36.0–46.0)
HEMOGLOBIN: 6.2 g/dL — AB (ref 12.0–15.0)
Hemoglobin: 7 g/dL — ABNORMAL LOW (ref 12.0–15.0)
MCH: 29.1 pg (ref 26.0–34.0)
MCH: 28.9 pg (ref 26.0–34.0)
MCHC: 32.1 g/dL (ref 30.0–36.0)
MCHC: 32.4 g/dL (ref 30.0–36.0)
MCV: 90.6 fL (ref 78.0–100.0)
MCV: 89.3 fL (ref 78.0–100.0)
PLATELETS: 243 10*3/uL (ref 150–400)
Platelets: 249 10*3/uL (ref 150–400)
RBC: 2.13 MIL/uL — AB (ref 3.87–5.11)
RBC: 2.42 MIL/uL — AB (ref 3.87–5.11)
RDW: 17.8 % — ABNORMAL HIGH (ref 11.5–15.5)
RDW: 17.6 % — ABNORMAL HIGH (ref 11.5–15.5)
WBC: 10 10*3/uL (ref 4.0–10.5)
WBC: 9.5 10*3/uL (ref 4.0–10.5)

## 2017-05-10 LAB — PROTIME-INR
INR: 1.19
PROTHROMBIN TIME: 15 s (ref 11.4–15.2)

## 2017-05-10 LAB — RENAL FUNCTION PANEL
ANION GAP: 14 (ref 5–15)
Albumin: 1.7 g/dL — ABNORMAL LOW (ref 3.5–5.0)
BUN: 51 mg/dL — ABNORMAL HIGH (ref 6–20)
CHLORIDE: 91 mmol/L — AB (ref 101–111)
CO2: 24 mmol/L (ref 22–32)
Calcium: 8.3 mg/dL — ABNORMAL LOW (ref 8.9–10.3)
Creatinine, Ser: 6.28 mg/dL — ABNORMAL HIGH (ref 0.44–1.00)
GFR, EST AFRICAN AMERICAN: 9 mL/min — AB (ref >60)
GFR, EST NON AFRICAN AMERICAN: 7 mL/min — AB (ref >60)
Glucose, Bld: 210 mg/dL — ABNORMAL HIGH (ref 65–99)
POTASSIUM: 3.7 mmol/L (ref 3.5–5.1)
Phosphorus: 4.3 mg/dL (ref 2.5–4.6)
Sodium: 129 mmol/L — ABNORMAL LOW (ref 135–145)

## 2017-05-10 LAB — PREPARE RBC (CROSSMATCH)

## 2017-05-11 LAB — CBC
HEMATOCRIT: 24.8 % — AB (ref 36.0–46.0)
HEMOGLOBIN: 7.9 g/dL — AB (ref 12.0–15.0)
MCH: 28.7 pg (ref 26.0–34.0)
MCHC: 31.9 g/dL (ref 30.0–36.0)
MCV: 90.2 fL (ref 78.0–100.0)
Platelets: 237 10*3/uL (ref 150–400)
RBC: 2.75 MIL/uL — AB (ref 3.87–5.11)
RDW: 17.5 % — ABNORMAL HIGH (ref 11.5–15.5)
WBC: 11.1 10*3/uL — ABNORMAL HIGH (ref 4.0–10.5)

## 2017-05-12 LAB — CBC
HEMATOCRIT: 24.4 % — AB (ref 36.0–46.0)
HEMOGLOBIN: 7.8 g/dL — AB (ref 12.0–15.0)
MCH: 28.7 pg (ref 26.0–34.0)
MCHC: 32 g/dL (ref 30.0–36.0)
MCV: 89.7 fL (ref 78.0–100.0)
Platelets: 253 10*3/uL (ref 150–400)
RBC: 2.72 MIL/uL — AB (ref 3.87–5.11)
RDW: 17.3 % — ABNORMAL HIGH (ref 11.5–15.5)
WBC: 10.4 10*3/uL (ref 4.0–10.5)

## 2017-05-12 NOTE — Progress Notes (Signed)
[facility]  Subjective  - Doing better, moving more and communicating with pad and ped.   Objective           No intake or output data in the 24 hours ending 05/12/17 1054  Right transmet amputation site healing well, right foot warm to touch. Wound vac to suction right groin plan to change M-W-F.     Assessment/Planning: POD # 3  Procedure:   #1: Right transmetatarsal amputation                         #2: Exploration of right groin with evacuation of seroma and application of wound VAC.   Will try again later, but patient wants to wait until tomorrow before changing wound vac.  She has had to many events already today. Tolerated right TMA dressing change.  Roxy Horseman 05/12/2017 10:54 AM --  Laboratory Lab Results: Recent Labs    05/11/17 0757 05/12/17 0627  WBC 11.1* 10.4  HGB 7.9* 7.8*  HCT 24.8* 24.4*  PLT 237 253   BMET Recent Labs    05/10/17 0950  NA 129*  K 3.7  CL 91*  CO2 24  GLUCOSE 210*  BUN 51*  CREATININE 6.28*  CALCIUM 8.3*    COAG Lab Results  Component Value Date   INR 1.19 05/09/2017   INR 1.12 05/09/2017   INR 1.09 04/29/2017   No results found for: PTT

## 2017-05-12 NOTE — Progress Notes (Signed)
[facility]  ROUNDING NOTE   Subjective:  Patient seen at bedside. She terminated dialysis early today. States that she was having some pain while laying on her side   Objective:  Vital signs in last 24 hours:  Pulse 79 respirations 36 blood pressure 165/87  Physical Exam: General: No acute distress  Head: Normocephalic, atraumatic.  NG in place.  Eyes: Anicteric  Neck: Tracheostomy in place  Lungs:  Clear to auscultation, normal effort  Heart: S1S2 no rubs  Abdomen:  Soft, nontender, bowel sounds present  Extremities: Trace peripheral edema.  Neurologic: Awake, alert, following commands  Skin: No lesions  Access: LUE AVF    Basic Metabolic Panel: Recent Labs  Lab 05/07/17 0829 05/09/17 0627 05/10/17 0950  NA 131* 130* 129*  K 3.7 3.9 3.7  CL 92* 92* 91*  CO2 26 25 24   GLUCOSE 158* 231* 210*  BUN 43* 46* 51*  CREATININE 5.68* 6.31* 6.28*  CALCIUM 8.7* 8.8* 8.3*  PHOS 5.4* 4.8* 4.3    Liver Function Tests: Recent Labs  Lab 05/07/17 0829 05/09/17 0627 05/10/17 0950  ALBUMIN 1.8* 1.9* 1.7*   No results for input(s): LIPASE, AMYLASE in the last 168 hours. No results for input(s): AMMONIA in the last 168 hours.  CBC: Recent Labs  Lab 05/09/17 1159 05/09/17 1240 05/10/17 0900 05/11/17 0757 05/12/17 0627  WBC 9.5 10.0 9.5 11.1* 10.4  HGB 7.6* 6.2* 7.0* 7.9* 7.8*  HCT 23.9* 19.3* 21.6* 24.8* 24.4*  MCV 91.2 90.6 89.3 90.2 89.7  PLT 256 243 249 237 253    Cardiac Enzymes: No results for input(s): CKTOTAL, CKMB, CKMBINDEX, TROPONINI in the last 168 hours.  BNP: Invalid input(s): POCBNP  CBG: No results for input(s): GLUCAP in the last 168 hours.  Microbiology: Results for orders placed or performed during the hospital encounter of 04/28/17  C difficile quick scan w PCR reflex     Status: None   Collection Time: 04/29/17  2:55 PM  Result Value Ref Range Status   C Diff antigen NEGATIVE NEGATIVE Final   C Diff toxin NEGATIVE  NEGATIVE Final   C Diff interpretation No C. difficile detected.  Final    Coagulation Studies: Recent Labs    05/09/17 2315  LABPROT 15.0  INR 1.19    Urinalysis: No results for input(s): COLORURINE, LABSPEC, PHURINE, GLUCOSEU, HGBUR, BILIRUBINUR, KETONESUR, PROTEINUR, UROBILINOGEN, NITRITE, LEUKOCYTESUR in the last 72 hours.  Invalid input(s): APPERANCEUR    Imaging: No results found.   Medications:       Assessment/ Plan:  41 y.o. female with a PMHx of ESRD on HD MWF followed by [facility], hypertension, anemia of chronic kidney disease, secondary hyperparathyroidism, restrictive cardiomyopathy, coronary artery disease, peripheral vascular disease who was suffered cardiac arrest on April 16, 2017 as well as April 18, 2017.  The patient suffered her first cardiac arrest during vascular procedure.  She is status post right femoral artery endarterectomy.  During her first cardiac arrest CPR was administered for 2 hours.  She now has ventilator dependent respiratory failure and has a tracheostomy in place.   1.  ESRD on HD MWF: Patient was followed by [facility] while at [facility].   -Patient cut her dialysis treatment short today secondary to pain.  We will plan for dialysis again on Wednesday.  2.  Anemia of chronic kidney disease.     hemoglobin currently 7.8.  Maintain the patient on Aranesp 200 mcg subcutaneous weekly.  3.  Secondary hyperparathyroidism.  Phosphorus at target of 4.3.  Continue to monitor.  4.  Acute respiratory failure.  Management as per pulmonary/critical care.    LOS: 0 Duane Earnshaw 12/17/20183:36 PM

## 2017-05-13 ENCOUNTER — Encounter (HOSPITAL_COMMUNITY): Payer: Self-pay | Admitting: Surgery

## 2017-05-13 LAB — BASIC METABOLIC PANEL
Anion gap: 10 (ref 5–15)
BUN: 46 mg/dL — ABNORMAL HIGH (ref 6–20)
CHLORIDE: 94 mmol/L — AB (ref 101–111)
CO2: 28 mmol/L (ref 22–32)
Calcium: 8.8 mg/dL — ABNORMAL LOW (ref 8.9–10.3)
Creatinine, Ser: 4.83 mg/dL — ABNORMAL HIGH (ref 0.44–1.00)
GFR calc non Af Amer: 10 mL/min — ABNORMAL LOW (ref >60)
GFR, EST AFRICAN AMERICAN: 12 mL/min — AB (ref >60)
Glucose, Bld: 161 mg/dL — ABNORMAL HIGH (ref 65–99)
POTASSIUM: 4.3 mmol/L (ref 3.5–5.1)
SODIUM: 132 mmol/L — AB (ref 135–145)

## 2017-05-13 LAB — CBC WITH DIFFERENTIAL/PLATELET
Basophils Absolute: 0 10*3/uL (ref 0.0–0.1)
Basophils Relative: 0 %
EOS ABS: 0.3 10*3/uL (ref 0.0–0.7)
Eosinophils Relative: 3 %
HEMATOCRIT: 25.3 % — AB (ref 36.0–46.0)
HEMOGLOBIN: 8 g/dL — AB (ref 12.0–15.0)
LYMPHS ABS: 0.9 10*3/uL (ref 0.7–4.0)
LYMPHS PCT: 8 %
MCH: 28.7 pg (ref 26.0–34.0)
MCHC: 31.6 g/dL (ref 30.0–36.0)
MCV: 90.7 fL (ref 78.0–100.0)
Monocytes Absolute: 1 10*3/uL (ref 0.1–1.0)
Monocytes Relative: 9 %
NEUTROS PCT: 80 %
Neutro Abs: 9 10*3/uL — ABNORMAL HIGH (ref 1.7–7.7)
Platelets: 279 10*3/uL (ref 150–400)
RBC: 2.79 MIL/uL — ABNORMAL LOW (ref 3.87–5.11)
RDW: 17 % — ABNORMAL HIGH (ref 11.5–15.5)
WBC: 11.3 10*3/uL — AB (ref 4.0–10.5)

## 2017-05-14 ENCOUNTER — Other Ambulatory Visit (HOSPITAL_COMMUNITY): Payer: Medicare Other

## 2017-05-14 LAB — RENAL FUNCTION PANEL
Albumin: 1.7 g/dL — ABNORMAL LOW (ref 3.5–5.0)
Anion gap: 15 (ref 5–15)
BUN: 63 mg/dL — AB (ref 6–20)
CHLORIDE: 89 mmol/L — AB (ref 101–111)
CO2: 25 mmol/L (ref 22–32)
Calcium: 8.9 mg/dL (ref 8.9–10.3)
Creatinine, Ser: 5.52 mg/dL — ABNORMAL HIGH (ref 0.44–1.00)
GFR calc Af Amer: 10 mL/min — ABNORMAL LOW (ref >60)
GFR, EST NON AFRICAN AMERICAN: 9 mL/min — AB (ref >60)
GLUCOSE: 195 mg/dL — AB (ref 65–99)
POTASSIUM: 4.6 mmol/L (ref 3.5–5.1)
Phosphorus: 6.2 mg/dL — ABNORMAL HIGH (ref 2.5–4.6)
Sodium: 129 mmol/L — ABNORMAL LOW (ref 135–145)

## 2017-05-14 LAB — TYPE AND SCREEN
ABO/RH(D): A POS
ANTIBODY SCREEN: NEGATIVE
UNIT DIVISION: 0
UNIT DIVISION: 0
Unit division: 0
Unit division: 0

## 2017-05-14 LAB — BPAM RBC
BLOOD PRODUCT EXPIRATION DATE: 201901012359
BLOOD PRODUCT EXPIRATION DATE: 201901052359
Blood Product Expiration Date: 201901012359
Blood Product Expiration Date: 201901012359
ISSUE DATE / TIME: 201812102022
ISSUE DATE / TIME: 201812150301
ISSUE DATE / TIME: 201812150938
UNIT TYPE AND RH: 6200
UNIT TYPE AND RH: 6200
Unit Type and Rh: 6200
Unit Type and Rh: 6200

## 2017-05-14 LAB — CBC
HEMATOCRIT: 23.1 % — AB (ref 36.0–46.0)
Hemoglobin: 7.3 g/dL — ABNORMAL LOW (ref 12.0–15.0)
MCH: 28.6 pg (ref 26.0–34.0)
MCHC: 31.6 g/dL (ref 30.0–36.0)
MCV: 90.6 fL (ref 78.0–100.0)
Platelets: 252 10*3/uL (ref 150–400)
RBC: 2.55 MIL/uL — ABNORMAL LOW (ref 3.87–5.11)
RDW: 16.9 % — AB (ref 11.5–15.5)
WBC: 11.4 10*3/uL — ABNORMAL HIGH (ref 4.0–10.5)

## 2017-05-14 NOTE — Progress Notes (Signed)
[facility]  ROUNDING NOTE   Subjective:  Patient seen at bedside. She is due for hemodialysis today. Appears to be doing better. She is currently sitting up.   Objective:  Vital signs in last 24 hours:  Pulse 79 respirations 36 blood pressure 165/87  Physical Exam: General: No acute distress  Head: Normocephalic, atraumatic.  NG in place.  Eyes: Anicteric  Neck: Tracheostomy in place, capped.  Lungs:  Clear to auscultation, normal effort  Heart: S1S2 no rubs  Abdomen:  Soft, nontender, bowel sounds present  Extremities: Trace peripheral edema.  Neurologic: Awake, alert, following commands  Skin: No lesions  Access: LUE AVF    Basic Metabolic Panel: Recent Labs  Lab 05/09/17 0627 05/10/17 0950 05/13/17 0746 05/14/17 0531  NA 130* 129* 132* 129*  K 3.9 3.7 4.3 4.6  CL 92* 91* 94* 89*  CO2 25 24 28 25   GLUCOSE 231* 210* 161* 195*  BUN 46* 51* 46* 63*  CREATININE 6.31* 6.28* 4.83* 5.52*  CALCIUM 8.8* 8.3* 8.8* 8.9  PHOS 4.8* 4.3  --  6.2*    Liver Function Tests: Recent Labs  Lab 05/09/17 0627 05/10/17 0950 05/14/17 0531  ALBUMIN 1.9* 1.7* 1.7*   No results for input(s): LIPASE, AMYLASE in the last 168 hours. No results for input(s): AMMONIA in the last 168 hours.  CBC: Recent Labs  Lab 05/10/17 0900 05/11/17 0757 05/12/17 0627 05/13/17 0746 05/14/17 0531  WBC 9.5 11.1* 10.4 11.3* 11.4*  NEUTROABS  --   --   --  9.0*  --   HGB 7.0* 7.9* 7.8* 8.0* 7.3*  HCT 21.6* 24.8* 24.4* 25.3* 23.1*  MCV 89.3 90.2 89.7 90.7 90.6  PLT 249 237 253 279 252    Cardiac Enzymes: No results for input(s): CKTOTAL, CKMB, CKMBINDEX, TROPONINI in the last 168 hours.  BNP: Invalid input(s): POCBNP  CBG: No results for input(s): GLUCAP in the last 168 hours.  Microbiology: Results for orders placed or performed during the hospital encounter of 04/28/17  C difficile quick scan w PCR reflex     Status: None   Collection Time: 04/29/17  2:55 PM   Result Value Ref Range Status   C Diff antigen NEGATIVE NEGATIVE Final   C Diff toxin NEGATIVE NEGATIVE Final   C Diff interpretation No C. difficile detected.  Final    Coagulation Studies: No results for input(s): LABPROT, INR in the last 72 hours.  Urinalysis: No results for input(s): COLORURINE, LABSPEC, PHURINE, GLUCOSEU, HGBUR, BILIRUBINUR, KETONESUR, PROTEINUR, UROBILINOGEN, NITRITE, LEUKOCYTESUR in the last 72 hours.  Invalid input(s): APPERANCEUR    Imaging: No results found.   Medications:       Assessment/ Plan:  41 y.o. female with a PMHx of ESRD on HD MWF followed by [facility], hypertension, anemia of chronic kidney disease, secondary hyperparathyroidism, restrictive cardiomyopathy, coronary artery disease, peripheral vascular disease who was suffered cardiac arrest on April 16, 2017 as well as April 18, 2017.  The patient suffered her first cardiac arrest during vascular procedure.  She is status post right femoral artery endarterectomy.  During her first cardiac arrest CPR was administered for 2 hours.  She now has ventilator dependent respiratory failure and has a tracheostomy in place.   1.  ESRD on HD MWF: Patient was followed by [facility] while at [facility].   -She due for hemodialysis today.  Orders have been prepared.  2.  Anemia of chronic kidney disease.     Hemoglobin continues to  drift down despite Aranesp.  Consider transfusion for hemoglobin of 7 or less.  3.  Secondary hyperparathyroidism.    Phosphorus was expectedly high on Monday.  We plan to repeat serum phosphorus today.  4.  Acute respiratory failure.  Trach ostomy currently capped.    LOS: 0 Akeila Lana 12/19/20183:39 PM

## 2017-05-14 NOTE — Progress Notes (Signed)
  Progress Note    05/14/2017 10:47 AM 5 Days Post-Op  Subjective: No new complaints; able to speak now and participate during exam   There were no vitals filed for this visit. Physical Exam: Incisions:  Some serous drainage on dressing of R groin; bovine patch not exposed; wound bed appears healthy without frank pus or purulence Extremities:  R foot TMA somewhat edematous however no dehiscence of incision; mild darkening of dorsal skin edge however this is stable; also mild darkening of lateral aspect of incision however no purulence with manipulation Neurologic: alert  CBC    Component Value Date/Time   WBC 11.4 (H) 05/14/2017 0531   RBC 2.55 (L) 05/14/2017 0531   HGB 7.3 (L) 05/14/2017 0531   HCT 23.1 (L) 05/14/2017 0531   PLT 252 05/14/2017 0531   MCV 90.6 05/14/2017 0531   MCH 28.6 05/14/2017 0531   MCHC 31.6 05/14/2017 0531   RDW 16.9 (H) 05/14/2017 0531   LYMPHSABS 0.9 05/13/2017 0746   MONOABS 1.0 05/13/2017 0746   EOSABS 0.3 05/13/2017 0746   BASOSABS 0.0 05/13/2017 0746    BMET    Component Value Date/Time   NA 129 (L) 05/14/2017 0531   K 4.6 05/14/2017 0531   CL 89 (L) 05/14/2017 0531   CO2 25 05/14/2017 0531   GLUCOSE 195 (H) 05/14/2017 0531   BUN 63 (H) 05/14/2017 0531   CREATININE 5.52 (H) 05/14/2017 0531   CALCIUM 8.9 05/14/2017 0531   GFRNONAA 9 (L) 05/14/2017 0531   GFRAA 10 (L) 05/14/2017 0531    INR    Component Value Date/Time   INR 1.19 05/09/2017 2315    No intake or output data in the 24 hours ending 05/14/17 1047   Assessment/Plan:  41 y.o. female is s/p R TMA and exploration of R groin 5 Days Post-Op   Non-adhesive, gauze, kerlex, and ace re-applied to R foot; continue daily dressing changes Encouraged elevation of R leg Continue wet to dry dressing changes daily R groin We will continue to follow for wound care   Dagoberto Ligas, PA-C [facility] [phone] 05/14/2017 10:47 AM

## 2017-05-15 ENCOUNTER — Other Ambulatory Visit (HOSPITAL_COMMUNITY): Payer: Medicare Other

## 2017-05-16 LAB — CBC
HEMATOCRIT: 21.6 % — AB (ref 36.0–46.0)
HEMOGLOBIN: 6.8 g/dL — AB (ref 12.0–15.0)
MCH: 28.5 pg (ref 26.0–34.0)
MCHC: 31.5 g/dL (ref 30.0–36.0)
MCV: 90.4 fL (ref 78.0–100.0)
Platelets: 270 10*3/uL (ref 150–400)
RBC: 2.39 MIL/uL — ABNORMAL LOW (ref 3.87–5.11)
RDW: 16.3 % — AB (ref 11.5–15.5)
WBC: 11.3 10*3/uL — AB (ref 4.0–10.5)

## 2017-05-16 LAB — RENAL FUNCTION PANEL
ALBUMIN: 1.8 g/dL — AB (ref 3.5–5.0)
ANION GAP: 12 (ref 5–15)
BUN: 62 mg/dL — AB (ref 6–20)
CHLORIDE: 90 mmol/L — AB (ref 101–111)
CO2: 27 mmol/L (ref 22–32)
Calcium: 8.8 mg/dL — ABNORMAL LOW (ref 8.9–10.3)
Creatinine, Ser: 6.31 mg/dL — ABNORMAL HIGH (ref 0.44–1.00)
GFR, EST AFRICAN AMERICAN: 9 mL/min — AB (ref >60)
GFR, EST NON AFRICAN AMERICAN: 7 mL/min — AB (ref >60)
Glucose, Bld: 255 mg/dL — ABNORMAL HIGH (ref 65–99)
PHOSPHORUS: 6 mg/dL — AB (ref 2.5–4.6)
POTASSIUM: 4.6 mmol/L (ref 3.5–5.1)
Sodium: 129 mmol/L — ABNORMAL LOW (ref 135–145)

## 2017-05-16 LAB — PREPARE RBC (CROSSMATCH)

## 2017-05-16 NOTE — Progress Notes (Signed)
[facility]  ROUNDING NOTE   Subjective:  With patient due for hemodialysis today. She is resting comfortably in bed at the moment. Hemoglobin down to 6.8.  Objective:  Vital signs in last 24 hours:  Temperature 97.7 pulse 72 respirations 20 blood pressure 97/83  Physical Exam: General: No acute distress  Head: Normocephalic, atraumatic.  NG in place.  Eyes: Anicteric  Neck: Tracheostomy in place  Lungs:  Clear to auscultation, normal effort  Heart: S1S2 no rubs  Abdomen:  Soft, nontender, bowel sounds present  Extremities: Trace peripheral edema.  Neurologic: Awake, alert, following commands  Skin: No lesions  Access: LUE AVF    Basic Metabolic Panel: Recent Labs  Lab 05/10/17 0950 05/13/17 0746 05/14/17 0531 05/16/17 0713  NA 129* 132* 129* 129*  K 3.7 4.3 4.6 4.6  CL 91* 94* 89* 90*  CO2 24 28 25 27   GLUCOSE 210* 161* 195* 255*  BUN 51* 46* 63* 62*  CREATININE 6.28* 4.83* 5.52* 6.31*  CALCIUM 8.3* 8.8* 8.9 8.8*  PHOS 4.3  --  6.2* 6.0*    Liver Function Tests: Recent Labs  Lab 05/10/17 0950 05/14/17 0531 05/16/17 0713  ALBUMIN 1.7* 1.7* 1.8*   No results for input(s): LIPASE, AMYLASE in the last 168 hours. No results for input(s): AMMONIA in the last 168 hours.  CBC: Recent Labs  Lab 05/11/17 0757 05/12/17 0627 05/13/17 0746 05/14/17 0531 05/16/17 0713  WBC 11.1* 10.4 11.3* 11.4* 11.3*  NEUTROABS  --   --  9.0*  --   --   HGB 7.9* 7.8* 8.0* 7.3* 6.8*  HCT 24.8* 24.4* 25.3* 23.1* 21.6*  MCV 90.2 89.7 90.7 90.6 90.4  PLT 237 253 279 252 270    Cardiac Enzymes: No results for input(s): CKTOTAL, CKMB, CKMBINDEX, TROPONINI in the last 168 hours.  BNP: Invalid input(s): POCBNP  CBG: No results for input(s): GLUCAP in the last 168 hours.  Microbiology: Results for orders placed or performed during the hospital encounter of 04/28/17  C difficile quick scan w PCR reflex     Status: None   Collection Time: 04/29/17  2:55 PM   Result Value Ref Range Status   C Diff antigen NEGATIVE NEGATIVE Final   C Diff toxin NEGATIVE NEGATIVE Final   C Diff interpretation No C. difficile detected.  Final    Coagulation Studies: No results for input(s): LABPROT, INR in the last 72 hours.  Urinalysis: No results for input(s): COLORURINE, LABSPEC, PHURINE, GLUCOSEU, HGBUR, BILIRUBINUR, KETONESUR, PROTEINUR, UROBILINOGEN, NITRITE, LEUKOCYTESUR in the last 72 hours.  Invalid input(s): APPERANCEUR    Imaging: No results found.   Medications:       Assessment/ Plan:  41 y.o. female with a PMHx of ESRD on HD MWF followed by [facility], hypertension, anemia of chronic kidney disease, secondary hyperparathyroidism, restrictive cardiomyopathy, coronary artery disease, peripheral vascular disease who was suffered cardiac arrest on April 16, 2017 as well as April 18, 2017.  The patient suffered her first cardiac arrest during vascular procedure.  She is status post right femoral artery endarterectomy.  During her first cardiac arrest CPR was administered for 2 hours.  She now has ventilator dependent respiratory failure and has a tracheostomy in place.   1.  ESRD on HD MWF: Patient was followed by [facility] while at [facility].   -Patient due for dialysis today.  Orders have been prepared.  Thereafter her next dialysis will be on Monday.  2.  Anemia of chronic kidney  disease.     Hemoglobin now down to 6.8.  Recommend blood transfusion but defer this to hospitalist.  Patient has also been maintained on Aranesp 200 mcg subcutaneous weekly.  3.  Secondary hyperparathyroidism.    Phosphorus 6.0 today.  Should come down with dialysis.  Continue to monitor.  4.  Acute respiratory failure.  At the last visit the patient's tracheostomy was capped.  She is currently on a T-piece.  Continue to monitor.   LOS: 0 [name] 12/21/20188:28 AM

## 2017-05-16 NOTE — Progress Notes (Signed)
I agree with the above.  All dressings were changed today. Progress Note    05/16/2017 11:28 AM 7 Days Post-Op  Subjective:  Laying in bed comfortably   Physical Exam: Lungs:  Non labored Incisions:  Right groin with fibrinous tissue throughout most of the wound.  Proximal portion of wound probed with serosanguinous fluid present.   Extremities:  Right foot incision is clean;  Distal foot is dusky.   CBC    Component Value Date/Time   WBC 11.3 (H) 05/16/2017 0713   RBC 2.39 (L) 05/16/2017 0713   HGB 6.8 (LL) 05/16/2017 0713   HCT 21.6 (L) 05/16/2017 0713   PLT 270 05/16/2017 0713   MCV 90.4 05/16/2017 0713   MCH 28.5 05/16/2017 0713   MCHC 31.5 05/16/2017 0713   RDW 16.3 (H) 05/16/2017 0713   LYMPHSABS 0.9 05/13/2017 0746   MONOABS 1.0 05/13/2017 0746   EOSABS 0.3 05/13/2017 0746   BASOSABS 0.0 05/13/2017 0746    BMET    Component Value Date/Time   NA 129 (L) 05/16/2017 0713   K 4.6 05/16/2017 0713   CL 90 (L) 05/16/2017 0713   CO2 27 05/16/2017 0713   GLUCOSE 255 (H) 05/16/2017 0713   BUN 62 (H) 05/16/2017 0713   CREATININE 6.31 (H) 05/16/2017 0713   CALCIUM 8.8 (L) 05/16/2017 0713   GFRNONAA 7 (L) 05/16/2017 0713   GFRAA 9 (L) 05/16/2017 0713    INR    Component Value Date/Time   INR 1.19 05/09/2017 2315     Assessment:  41 y.o. female is s/p:  R TMA and exploration of R groin   7 Days Post-Op  Plan: -right foot is marginal-continue dry dressing to foot daily.  Wound is in tact. -continue to float heels -right groin with fibrinous tissue present throughout most of wound-Dr. Trula Slade probed the proximal portion of wound with serosanguinous fluid present.  Proximal wound should be packed with dry gauze at least daily and also when saturated.      Janet Locket, PA-C [facility] [phone] 05/16/2017 11:28 AM  I agree with the above.  I have seen and evaluated the patient and agree with the above assessment and  plan  [name]

## 2017-05-17 LAB — BPAM RBC
BLOOD PRODUCT EXPIRATION DATE: 201812282359
ISSUE DATE / TIME: 201812211510
UNIT TYPE AND RH: 6200

## 2017-05-17 LAB — TYPE AND SCREEN
ABO/RH(D): A POS
ANTIBODY SCREEN: NEGATIVE
Unit division: 0

## 2017-05-19 LAB — CBC
HCT: 23 % — ABNORMAL LOW (ref 36.0–46.0)
Hemoglobin: 7.2 g/dL — ABNORMAL LOW (ref 12.0–15.0)
MCH: 28.7 pg (ref 26.0–34.0)
MCHC: 31.3 g/dL (ref 30.0–36.0)
MCV: 91.6 fL (ref 78.0–100.0)
PLATELETS: 298 10*3/uL (ref 150–400)
RBC: 2.51 MIL/uL — ABNORMAL LOW (ref 3.87–5.11)
RDW: 16.9 % — AB (ref 11.5–15.5)
WBC: 11.6 10*3/uL — ABNORMAL HIGH (ref 4.0–10.5)

## 2017-05-19 LAB — RENAL FUNCTION PANEL
Albumin: 1.7 g/dL — ABNORMAL LOW (ref 3.5–5.0)
Anion gap: 13 (ref 5–15)
BUN: 79 mg/dL — ABNORMAL HIGH (ref 6–20)
CALCIUM: 9 mg/dL (ref 8.9–10.3)
CO2: 26 mmol/L (ref 22–32)
CREATININE: 7.47 mg/dL — AB (ref 0.44–1.00)
Chloride: 96 mmol/L — ABNORMAL LOW (ref 101–111)
GFR calc Af Amer: 7 mL/min — ABNORMAL LOW (ref >60)
GFR calc non Af Amer: 6 mL/min — ABNORMAL LOW (ref >60)
GLUCOSE: 266 mg/dL — AB (ref 65–99)
Phosphorus: 6.5 mg/dL — ABNORMAL HIGH (ref 2.5–4.6)
Potassium: 5.5 mmol/L — ABNORMAL HIGH (ref 3.5–5.1)
SODIUM: 135 mmol/L (ref 135–145)

## 2017-05-19 NOTE — Progress Notes (Signed)
   Right foot open to air, no drainage. Dusky appearance dorsum and plantar of right foot surround incision.  Heel still tender to palpation without open wound. Right groin dressing saturated with SS drainage, yellow eschar at base of wound. Dry dressing applied  No significant change  Roxy Horseman PA-C

## 2017-05-19 NOTE — Progress Notes (Signed)
[facility]  ROUNDING NOTE   Subjective:  Husband at bedside. Seen and examined on hemodialysis. Tolerating treatment well.   2k bath   Objective:  Vital signs in last 24 hours:  Temperature 97.6 pulse 70 respirations 12 blood pressure 154/90  Physical Exam: General: No acute distress  Head: Normocephalic, atraumatic.     Eyes: Anicteric  Neck: Tracheostomy in place  Lungs:  Clear to auscultation, normal effort  Heart: S1S2 no rubs  Abdomen:  Soft, nontender, bowel sounds present  Extremities: No peripheral edema.  Neurologic: Awake, alert, following commands  Skin: No lesions  Access: LUE AVF    Basic Metabolic Panel: Recent Labs  Lab 05/13/17 0746 05/14/17 0531 05/16/17 0713 05/19/17 0634  NA 132* 129* 129* 135  K 4.3 4.6 4.6 5.5*  CL 94* 89* 90* 96*  CO2 28 25 27 26   GLUCOSE 161* 195* 255* 266*  BUN 46* 63* 62* 79*  CREATININE 4.83* 5.52* 6.31* 7.47*  CALCIUM 8.8* 8.9 8.8* 9.0  PHOS  --  6.2* 6.0* 6.5*    Liver Function Tests: Recent Labs  Lab 05/14/17 0531 05/16/17 0713 05/19/17 0634  ALBUMIN 1.7* 1.8* 1.7*   No results for input(s): LIPASE, AMYLASE in the last 168 hours. No results for input(s): AMMONIA in the last 168 hours.  CBC: Recent Labs  Lab 05/13/17 0746 05/14/17 0531 05/16/17 0713 05/19/17 0634  WBC 11.3* 11.4* 11.3* 11.6*  NEUTROABS 9.0*  --   --   --   HGB 8.0* 7.3* 6.8* 7.2*  HCT 25.3* 23.1* 21.6* 23.0*  MCV 90.7 90.6 90.4 91.6  PLT 279 252 270 298    Cardiac Enzymes: No results for input(s): CKTOTAL, CKMB, CKMBINDEX, TROPONINI in the last 168 hours.  BNP: Invalid input(s): POCBNP  CBG: No results for input(s): GLUCAP in the last 168 hours.  Microbiology: Results for orders placed or performed during the hospital encounter of 04/28/17  C difficile quick scan w PCR reflex     Status: None   Collection Time: 04/29/17  2:55 PM  Result Value Ref Range Status   C Diff antigen NEGATIVE NEGATIVE Final   C  Diff toxin NEGATIVE NEGATIVE Final   C Diff interpretation No C. difficile detected.  Final    Coagulation Studies: No results for input(s): LABPROT, INR in the last 72 hours.  Urinalysis: No results for input(s): COLORURINE, LABSPEC, PHURINE, GLUCOSEU, HGBUR, BILIRUBINUR, KETONESUR, PROTEINUR, UROBILINOGEN, NITRITE, LEUKOCYTESUR in the last 72 hours.  Invalid input(s): APPERANCEUR    Imaging: No results found.   Medications:       Assessment/ Plan:  41 y.o. white female with a PMHx of ESRD on HD MWF followed by [facility], hypertension, anemia of chronic kidney disease, secondary hyperparathyroidism, restrictive cardiomyopathy, coronary artery disease, peripheral vascular disease who was suffered cardiac arrest on April 16, 2017 as well as April 18, 2017.  The patient suffered her first cardiac arrest during vascular procedure.  She is status post right femoral artery endarterectomy.  During her first cardiac arrest CPR was administered for 2 hours.  She now has ventilator dependent respiratory failure and has a tracheostomy in place.   1.  ESRD on HD MWF: Patient was followed by [facility] while at [facility].   Seen and examined on hemodialysis.  Continue MWF schedule.  Potassium elevated - on spironolactone. If continues to be high, may need to discontinue  2.  Anemia of chronic kidney disease.     Hemoglobin 7.2. Status  post PRBC transfusion last week.  - Aranesp   3.  Secondary hyperparathyroidism with hyperphosphatemia:  - sevelamer to be restarted.   4.  Hypertension: elevated today.  - regimen of amlodipine, clonidine, hydralazine, isosorbide dinitrate, labetalol.    LOS: 0 Sabas Frett 12/24/20183:20 PM

## 2017-05-21 LAB — RENAL FUNCTION PANEL
ANION GAP: 13 (ref 5–15)
Albumin: 1.7 g/dL — ABNORMAL LOW (ref 3.5–5.0)
BUN: 68 mg/dL — ABNORMAL HIGH (ref 6–20)
CO2: 25 mmol/L (ref 22–32)
Calcium: 9.1 mg/dL (ref 8.9–10.3)
Chloride: 97 mmol/L — ABNORMAL LOW (ref 101–111)
Creatinine, Ser: 6.38 mg/dL — ABNORMAL HIGH (ref 0.44–1.00)
GFR calc non Af Amer: 7 mL/min — ABNORMAL LOW (ref >60)
GFR, EST AFRICAN AMERICAN: 9 mL/min — AB (ref >60)
GLUCOSE: 93 mg/dL (ref 65–99)
POTASSIUM: 4.6 mmol/L (ref 3.5–5.1)
Phosphorus: 5.2 mg/dL — ABNORMAL HIGH (ref 2.5–4.6)
Sodium: 135 mmol/L (ref 135–145)

## 2017-05-21 LAB — CBC
HEMATOCRIT: 23.1 % — AB (ref 36.0–46.0)
HEMOGLOBIN: 7.2 g/dL — AB (ref 12.0–15.0)
MCH: 28.5 pg (ref 26.0–34.0)
MCHC: 31.2 g/dL (ref 30.0–36.0)
MCV: 91.3 fL (ref 78.0–100.0)
Platelets: 271 10*3/uL (ref 150–400)
RBC: 2.53 MIL/uL — AB (ref 3.87–5.11)
RDW: 16.5 % — ABNORMAL HIGH (ref 11.5–15.5)
WBC: 11.7 10*3/uL — ABNORMAL HIGH (ref 4.0–10.5)

## 2017-05-21 LAB — POTASSIUM: Potassium: 4.8 mmol/L (ref 3.5–5.1)

## 2017-05-21 NOTE — Progress Notes (Signed)
[facility]  ROUNDING NOTE   Subjective:  Patient seen and evaluated during hemodialysis. Currently tolerating well. Patient is laying in dialysis chair.   Objective:  Vital signs in last 24 hours:  Temperature 97.4 pulse 66 respirations 16 blood pressure 169/86  Physical Exam: General: No acute distress  Head: Normocephalic, atraumatic.     Eyes: Anicteric  Neck: Tracheostomy in place  Lungs:  Clear to auscultation, normal effort  Heart: S1S2 no rubs  Abdomen:  Soft, nontender, bowel sounds present  Extremities: No peripheral edema.  Neurologic: Awake, alert, following commands  Skin: No lesions  Access: LUE AVF    Basic Metabolic Panel: Recent Labs  Lab 05/16/17 0713 05/19/17 0634 05/21/17 0715 05/21/17 1215  NA 129* 135 135  --   K 4.6 5.5* 4.6 4.8  CL 90* 96* 97*  --   CO2 27 26 25   --   GLUCOSE 255* 266* 93  --   BUN 62* 79* 68*  --   CREATININE 6.31* 7.47* 6.38*  --   CALCIUM 8.8* 9.0 9.1  --   PHOS 6.0* 6.5* 5.2*  --     Liver Function Tests: Recent Labs  Lab 05/16/17 0713 05/19/17 0634 05/21/17 0715  ALBUMIN 1.8* 1.7* 1.7*   No results for input(s): LIPASE, AMYLASE in the last 168 hours. No results for input(s): AMMONIA in the last 168 hours.  CBC: Recent Labs  Lab 05/16/17 0713 05/19/17 0634 05/21/17 0715  WBC 11.3* 11.6* 11.7*  HGB 6.8* 7.2* 7.2*  HCT 21.6* 23.0* 23.1*  MCV 90.4 91.6 91.3  PLT 270 298 271    Cardiac Enzymes: No results for input(s): CKTOTAL, CKMB, CKMBINDEX, TROPONINI in the last 168 hours.  BNP: Invalid input(s): POCBNP  CBG: No results for input(s): GLUCAP in the last 168 hours.  Microbiology: Results for orders placed or performed during the hospital encounter of 04/28/17  C difficile quick scan w PCR reflex     Status: None   Collection Time: 04/29/17  2:55 PM  Result Value Ref Range Status   C Diff antigen NEGATIVE NEGATIVE Final   C Diff toxin NEGATIVE NEGATIVE Final   C Diff  interpretation No C. difficile detected.  Final    Coagulation Studies: No results for input(s): LABPROT, INR in the last 72 hours.  Urinalysis: No results for input(s): COLORURINE, LABSPEC, PHURINE, GLUCOSEU, HGBUR, BILIRUBINUR, KETONESUR, PROTEINUR, UROBILINOGEN, NITRITE, LEUKOCYTESUR in the last 72 hours.  Invalid input(s): APPERANCEUR    Imaging: No results found.   Medications:       Assessment/ Plan:  41 y.o. white female with a PMHx of ESRD on HD MWF followed by [facility], hypertension, anemia of chronic kidney disease, secondary hyperparathyroidism, restrictive cardiomyopathy, coronary artery disease, peripheral vascular disease who was suffered cardiac arrest on April 16, 2017 as well as April 18, 2017.  The patient suffered her first cardiac arrest during vascular procedure.  She is status post right femoral artery endarterectomy.  During her first cardiac arrest CPR was administered for 2 hours.  She now has ventilator dependent respiratory failure and has a tracheostomy in place.   1.  ESRD on HD MWF: Patient was followed by [facility] while at [facility].   Patient seen and evaluated during hemodialysis.  She appears to be tolerating well.  We plan to complete dialysis today.  She is tolerating dialysis in a chair well at the moment.  2.  Anemia of chronic kidney disease.    We  will maintain the patient on Aranesp.  Hemoglobin currently 7.2.  Consider blood transfusion for hemoglobin of 7 or less.  3.  Secondary hyperparathyroidism with hyperphosphatemia:  -Phosphorus currently down to 5.2.  We will continue to monitor.  4.  Hypertension: elevated today.  Patient continues to have labile blood pressure. - continue amlodipine, clonidine, hydralazine, isosorbide dinitrate, labetalol.    LOS: 0 Taneah Masri 12/26/20184:18 PM

## 2017-05-23 LAB — PREPARE RBC (CROSSMATCH)

## 2017-05-23 LAB — CBC
HCT: 21.9 % — ABNORMAL LOW (ref 36.0–46.0)
HEMOGLOBIN: 6.8 g/dL — AB (ref 12.0–15.0)
MCH: 28.6 pg (ref 26.0–34.0)
MCHC: 31.1 g/dL (ref 30.0–36.0)
MCV: 92 fL (ref 78.0–100.0)
PLATELETS: 238 10*3/uL (ref 150–400)
RBC: 2.38 MIL/uL — AB (ref 3.87–5.11)
RDW: 16.9 % — ABNORMAL HIGH (ref 11.5–15.5)
WBC: 11 10*3/uL — AB (ref 4.0–10.5)

## 2017-05-23 LAB — RENAL FUNCTION PANEL
ANION GAP: 13 (ref 5–15)
Albumin: 1.7 g/dL — ABNORMAL LOW (ref 3.5–5.0)
BUN: 56 mg/dL — ABNORMAL HIGH (ref 6–20)
CALCIUM: 9 mg/dL (ref 8.9–10.3)
CHLORIDE: 96 mmol/L — AB (ref 101–111)
CO2: 26 mmol/L (ref 22–32)
CREATININE: 5.52 mg/dL — AB (ref 0.44–1.00)
GFR, EST AFRICAN AMERICAN: 10 mL/min — AB (ref >60)
GFR, EST NON AFRICAN AMERICAN: 9 mL/min — AB (ref >60)
Glucose, Bld: 113 mg/dL — ABNORMAL HIGH (ref 65–99)
Phosphorus: 4.5 mg/dL (ref 2.5–4.6)
Potassium: 4.6 mmol/L (ref 3.5–5.1)
SODIUM: 135 mmol/L (ref 135–145)

## 2017-05-23 NOTE — Progress Notes (Signed)
[facility]  ROUNDING NOTE   Subjective:  Patient due for hemodialysis today.  She will be receiving blood transfusion with dialysis as hemoglobin down to 6.8. She has been now decannulated.   Objective:  Vital signs in last 24 hours:  Temperature 98.3 pulse 76 respirations 18 blood pressure 153/82  Physical Exam: General: No acute distress  Head: Normocephalic, atraumatic.     Eyes: Anicteric  Neck: decannulated  Lungs:  Clear to auscultation, normal effort  Heart: S1S2 no rubs  Abdomen:  Soft, nontender, bowel sounds present  Extremities: No peripheral edema.  Right foot transmetatarsal amputation  Neurologic: Awake, alert, following commands  Skin: No lesions  Access: LUE AVF    Basic Metabolic Panel: Recent Labs  Lab 05/19/17 0634 05/21/17 0715 05/21/17 1215 05/23/17 0603  NA 135 135  --  135  K 5.5* 4.6 4.8 4.6  CL 96* 97*  --  96*  CO2 26 25  --  26  GLUCOSE 266* 93  --  113*  BUN 79* 68*  --  56*  CREATININE 7.47* 6.38*  --  5.52*  CALCIUM 9.0 9.1  --  9.0  PHOS 6.5* 5.2*  --  4.5    Liver Function Tests: Recent Labs  Lab 05/19/17 0634 05/21/17 0715 05/23/17 0603  ALBUMIN 1.7* 1.7* 1.7*   No results for input(s): LIPASE, AMYLASE in the last 168 hours. No results for input(s): AMMONIA in the last 168 hours.  CBC: Recent Labs  Lab 05/19/17 0634 05/21/17 0715 05/23/17 0603  WBC 11.6* 11.7* 11.0*  HGB 7.2* 7.2* 6.8*  HCT 23.0* 23.1* 21.9*  MCV 91.6 91.3 92.0  PLT 298 271 238    Cardiac Enzymes: No results for input(s): CKTOTAL, CKMB, CKMBINDEX, TROPONINI in the last 168 hours.  BNP: Invalid input(s): POCBNP  CBG: No results for input(s): GLUCAP in the last 168 hours.  Microbiology: Results for orders placed or performed during the hospital encounter of 04/28/17  C difficile quick scan w PCR reflex     Status: None   Collection Time: 04/29/17  2:55 PM  Result Value Ref Range Status   C Diff antigen NEGATIVE NEGATIVE  Final   C Diff toxin NEGATIVE NEGATIVE Final   C Diff interpretation No C. difficile detected.  Final    Coagulation Studies: No results for input(s): LABPROT, INR in the last 72 hours.  Urinalysis: No results for input(s): COLORURINE, LABSPEC, PHURINE, GLUCOSEU, HGBUR, BILIRUBINUR, KETONESUR, PROTEINUR, UROBILINOGEN, NITRITE, LEUKOCYTESUR in the last 72 hours.  Invalid input(s): APPERANCEUR    Imaging: No results found.   Medications:       Assessment/ Plan:  41 y.o. white female with a PMHx of ESRD on HD MWF followed by [facility], hypertension, anemia of chronic kidney disease, secondary hyperparathyroidism, restrictive cardiomyopathy, coronary artery disease, peripheral vascular disease who was suffered cardiac arrest on April 16, 2017 as well as April 18, 2017.  The patient suffered her first cardiac arrest during vascular procedure.  She is status post right femoral artery endarterectomy.  During her first cardiac arrest CPR was administered for 2 hours.  She now has ventilator dependent respiratory failure and has a tracheostomy in place.   1.  ESRD on HD MWF: Patient was followed by [facility] while at [facility].   Patient due for hemodialysis today.  Orders have been prepared.  2.  Anemia of chronic kidney disease.    Hemoglobin down to 6.8.  She will be receiving blood transfusion  today.  Continue Aranesp.  3.  Secondary hyperparathyroidism with hyperphosphatemia:  -Phosphorus currently 4.5 and acceptable.  4.  Hypertension: - continue amlodipine, clonidine, hydralazine, isosorbide dinitrate, labetalol.    LOS: 0 [name] 12/28/20188:36 AM

## 2017-05-24 LAB — TYPE AND SCREEN
ABO/RH(D): A POS
ANTIBODY SCREEN: NEGATIVE
UNIT DIVISION: 0

## 2017-05-24 LAB — BPAM RBC
Blood Product Expiration Date: 201901022359
ISSUE DATE / TIME: 201812281244
UNIT TYPE AND RH: 600

## 2017-05-24 LAB — CBC
HCT: 26.5 % — ABNORMAL LOW (ref 36.0–46.0)
HEMOGLOBIN: 7.8 g/dL — AB (ref 12.0–15.0)
MCH: 27.2 pg (ref 26.0–34.0)
MCHC: 29.4 g/dL — ABNORMAL LOW (ref 30.0–36.0)
MCV: 92.3 fL (ref 78.0–100.0)
PLATELETS: 257 10*3/uL (ref 150–400)
RBC: 2.87 MIL/uL — AB (ref 3.87–5.11)
RDW: 16.7 % — ABNORMAL HIGH (ref 11.5–15.5)
WBC: 11 10*3/uL — AB (ref 4.0–10.5)

## 2017-05-26 LAB — CBC
HEMATOCRIT: 28.5 % — AB (ref 36.0–46.0)
HEMOGLOBIN: 8.7 g/dL — AB (ref 12.0–15.0)
MCH: 28.3 pg (ref 26.0–34.0)
MCHC: 30.5 g/dL (ref 30.0–36.0)
MCV: 92.8 fL (ref 78.0–100.0)
Platelets: 293 10*3/uL (ref 150–400)
RBC: 3.07 MIL/uL — ABNORMAL LOW (ref 3.87–5.11)
RDW: 16.5 % — AB (ref 11.5–15.5)
WBC: 13.4 10*3/uL — AB (ref 4.0–10.5)

## 2017-05-26 LAB — RENAL FUNCTION PANEL
ANION GAP: 15 (ref 5–15)
Albumin: 1.8 g/dL — ABNORMAL LOW (ref 3.5–5.0)
BUN: 66 mg/dL — AB (ref 6–20)
CHLORIDE: 93 mmol/L — AB (ref 101–111)
CO2: 27 mmol/L (ref 22–32)
Calcium: 9.1 mg/dL (ref 8.9–10.3)
Creatinine, Ser: 6.39 mg/dL — ABNORMAL HIGH (ref 0.44–1.00)
GFR calc Af Amer: 8 mL/min — ABNORMAL LOW (ref >60)
GFR, EST NON AFRICAN AMERICAN: 7 mL/min — AB (ref >60)
Glucose, Bld: 35 mg/dL — CL (ref 65–99)
POTASSIUM: 4.6 mmol/L (ref 3.5–5.1)
Phosphorus: 4.7 mg/dL — ABNORMAL HIGH (ref 2.5–4.6)
Sodium: 135 mmol/L (ref 135–145)

## 2017-05-26 NOTE — Progress Notes (Signed)
[facility]  ROUNDING NOTE   Subjective:  Patient resting in bed comfortably. She will be due for dialysis today. Orders have been prepared.   Objective:  Vital signs in last 24 hours:  Temperature 96.9 pulse 72 respirations 16 blood pressure 125/57  Physical Exam: General: No acute distress  Head: Normocephalic, atraumatic.     Eyes: Anicteric  Neck: decannulated  Lungs:  Clear to auscultation, normal effort  Heart: S1S2 no rubs  Abdomen:  Soft, nontender, bowel sounds present  Extremities: No peripheral edema.  Right foot transmetatarsal amputation  Neurologic: Awake, alert, following commands  Skin: No lesions  Access: LUE AVF    Basic Metabolic Panel: Recent Labs  Lab 05/21/17 0715 05/21/17 1215 05/23/17 0603 05/26/17 0622  NA 135  --  135 135  K 4.6 4.8 4.6 4.6  CL 97*  --  96* 93*  CO2 25  --  26 27  GLUCOSE 93  --  113* 35*  BUN 68*  --  56* 66*  CREATININE 6.38*  --  5.52* 6.39*  CALCIUM 9.1  --  9.0 9.1  PHOS 5.2*  --  4.5 4.7*    Liver Function Tests: Recent Labs  Lab 05/21/17 0715 05/23/17 0603 05/26/17 0622  ALBUMIN 1.7* 1.7* 1.8*   No results for input(s): LIPASE, AMYLASE in the last 168 hours. No results for input(s): AMMONIA in the last 168 hours.  CBC: Recent Labs  Lab 05/21/17 0715 05/23/17 0603 05/24/17 0557 05/26/17 0622  WBC 11.7* 11.0* 11.0* 13.4*  HGB 7.2* 6.8* 7.8* 8.7*  HCT 23.1* 21.9* 26.5* 28.5*  MCV 91.3 92.0 92.3 92.8  PLT 271 238 257 293    Cardiac Enzymes: No results for input(s): CKTOTAL, CKMB, CKMBINDEX, TROPONINI in the last 168 hours.  BNP: Invalid input(s): POCBNP  CBG: No results for input(s): GLUCAP in the last 168 hours.  Microbiology: Results for orders placed or performed during the hospital encounter of 04/28/17  C difficile quick scan w PCR reflex     Status: None   Collection Time: 04/29/17  2:55 PM  Result Value Ref Range Status   C Diff antigen NEGATIVE NEGATIVE Final   C  Diff toxin NEGATIVE NEGATIVE Final   C Diff interpretation No C. difficile detected.  Final    Coagulation Studies: No results for input(s): LABPROT, INR in the last 72 hours.  Urinalysis: No results for input(s): COLORURINE, LABSPEC, PHURINE, GLUCOSEU, HGBUR, BILIRUBINUR, KETONESUR, PROTEINUR, UROBILINOGEN, NITRITE, LEUKOCYTESUR in the last 72 hours.  Invalid input(s): APPERANCEUR    Imaging: No results found.   Medications:       Assessment/ Plan:  41 y.o. white female with a PMHx of ESRD on HD MWF followed by [facility], hypertension, anemia of chronic kidney disease, secondary hyperparathyroidism, restrictive cardiomyopathy, coronary artery disease, peripheral vascular disease who was suffered cardiac arrest on April 16, 2017 as well as April 18, 2017.  The patient suffered her first cardiac arrest during vascular procedure.  She is status post right femoral artery endarterectomy.  During her first cardiac arrest CPR was administered for 2 hours.  She now has ventilator dependent respiratory failure and has a tracheostomy in place.   1.  ESRD on HD MWF: Patient was followed by [facility] while at [facility]   -Patient is due for hemodialysis today.  We will continue patient on a Monday, Wednesday, Friday dialysis schedule.  2.  Anemia of chronic kidney disease.    IMA globin up to  8.7 posttransfusion.  Continue Aranesp and monitor CBC.  3.  Secondary hyperparathyroidism with hyperphosphatemia:  -Phosphorus 4.7 at the moment and at target.  4.  Hypertension: - continue amlodipine, clonidine, hydralazine, isosorbide dinitrate, labetalol.  Blood pressure currently 125/57.   LOS: 0 Halsey Persaud 12/31/201811:44 AM

## 2017-05-26 NOTE — Progress Notes (Signed)
    Right femoral groin wound with fibrinous exudate and foul smell. I minimally debrided the wound and replaced the wet to dry. dakins solution ordered and will need daily dressing changes.   Shaniqua Guillot C. Donzetta Matters, MD [facility] [place] Office: [phone] Pager: [phone]

## 2017-05-28 LAB — CBC
HEMATOCRIT: 25.7 % — AB (ref 36.0–46.0)
Hemoglobin: 7.9 g/dL — ABNORMAL LOW (ref 12.0–15.0)
MCH: 28.5 pg (ref 26.0–34.0)
MCHC: 30.7 g/dL (ref 30.0–36.0)
MCV: 92.8 fL (ref 78.0–100.0)
PLATELETS: 305 10*3/uL (ref 150–400)
RBC: 2.77 MIL/uL — AB (ref 3.87–5.11)
RDW: 16.3 % — AB (ref 11.5–15.5)
WBC: 14.4 10*3/uL — AB (ref 4.0–10.5)

## 2017-05-28 LAB — SEDIMENTATION RATE: Sed Rate: 126 mm/hr — ABNORMAL HIGH (ref 0–22)

## 2017-05-28 LAB — PROCALCITONIN: PROCALCITONIN: 0.76 ng/mL

## 2017-05-28 NOTE — Progress Notes (Signed)
[facility]  ROUNDING NOTE   Subjective:  Completed dialysis today. Ultrafiltration achieved was 1.5 kg.   Objective:  Vital signs in last 24 hours:  Temperature 98.3 pulse 72 respirations 18 blood pressure 180/87  Physical Exam: General: No acute distress  Head: Normocephalic, atraumatic.     Eyes: Anicteric  Neck: decannulated  Lungs:  Clear to auscultation, normal effort  Heart: S1S2 no rubs  Abdomen:  Soft, nontender, bowel sounds present  Extremities: No peripheral edema.  Right foot transmetatarsal amputation  Neurologic: Awake, alert, following commands  Skin: No lesions  Access: LUE AVF    Basic Metabolic Panel: Recent Labs  Lab 05/23/17 0603 05/26/17 0622  NA 135 135  K 4.6 4.6  CL 96* 93*  CO2 26 27  GLUCOSE 113* 35*  BUN 56* 66*  CREATININE 5.52* 6.39*  CALCIUM 9.0 9.1  PHOS 4.5 4.7*    Liver Function Tests: Recent Labs  Lab 05/23/17 0603 05/26/17 0622  ALBUMIN 1.7* 1.8*   No results for input(s): LIPASE, AMYLASE in the last 168 hours. No results for input(s): AMMONIA in the last 168 hours.  CBC: Recent Labs  Lab 05/23/17 0603 05/24/17 0557 05/26/17 0622 05/28/17 0548  WBC 11.0* 11.0* 13.4* 14.4*  HGB 6.8* 7.8* 8.7* 7.9*  HCT 21.9* 26.5* 28.5* 25.7*  MCV 92.0 92.3 92.8 92.8  PLT 238 257 293 305    Cardiac Enzymes: No results for input(s): CKTOTAL, CKMB, CKMBINDEX, TROPONINI in the last 168 hours.  BNP: Invalid input(s): POCBNP  CBG: No results for input(s): GLUCAP in the last 168 hours.  Microbiology: Results for orders placed or performed during the hospital encounter of 04/28/17  C difficile quick scan w PCR reflex     Status: None   Collection Time: 04/29/17  2:55 PM  Result Value Ref Range Status   C Diff antigen NEGATIVE NEGATIVE Final   C Diff toxin NEGATIVE NEGATIVE Final   C Diff interpretation No C. difficile detected.  Final    Coagulation Studies: No results for input(s): LABPROT, INR in the last  72 hours.  Urinalysis: No results for input(s): COLORURINE, LABSPEC, PHURINE, GLUCOSEU, HGBUR, BILIRUBINUR, KETONESUR, PROTEINUR, UROBILINOGEN, NITRITE, LEUKOCYTESUR in the last 72 hours.  Invalid input(s): APPERANCEUR    Imaging: No results found.   Medications:       Assessment/ Plan:  42 y.o. white female with a PMHx of ESRD on HD MWF followed by [facility], hypertension, anemia of chronic kidney disease, secondary hyperparathyroidism, restrictive cardiomyopathy, coronary artery disease, peripheral vascular disease who was suffered cardiac arrest on April 16, 2017 as well as April 18, 2017.  The patient suffered her first cardiac arrest during vascular procedure.  She is status post right femoral artery endarterectomy.  During her first cardiac arrest CPR was administered for 2 hours.  She now has ventilator dependent respiratory failure and has a tracheostomy in place.   1.  ESRD on HD MWF: Patient was followed by [facility] while at [facility].   -Patient completed dialysis today.  Ultrafiltration achieved was 1.5 kg.  We will plan for dialysis again on Friday.  2.  Anemia of chronic kidney disease.    Patient's hemoglobin continues to drift down.  Currently hemoglobin 7.9.  She has received blood transfusion this admission.  She remains on Aranesp as well.  3.  Secondary hyperparathyroidism with hyperphosphatemia:  -Phosphorus was not drawn today.  Continue to follow phosphorus periodically.  4.  Hypertension: -Blood pressure continues to  be quite labile.  She is a bit upset given the fact that she may require additional surgery on her right foot.  Continue amlodipine, clonidine, hydralazine, isosorbide dinitrate, labetalol.   LOS: 0 Lollie Gunner 1/2/20194:34 PM

## 2017-05-28 NOTE — Progress Notes (Signed)
   Groin dressing was changed at bedside and was noted to be soaked with serous fluid. There is fibrinous exudate in the inferior aspect of the wound but no evidence of infection. Needs to have dressing changed at least daily but ideally bid.   Brandon C. Donzetta Matters, MD [facility] [place] Office: [phone] Pager: [phone]

## 2017-05-29 NOTE — Progress Notes (Addendum)
    Patient with improved mobility.  Sitting up on side of bed independently.  She has pain in the right foot when she first sits up, it dissipates after a few minuets.   Right transmetatarsal  amputation site with continues to demarcate.   Sutures are still intact. Right groin continues to drain SS/clear fluid  Plan for bedside debridement by Dr. Trula Slade today Possible removal of amputation site sutures?  Roxy Horseman PA-C   Patient seen and examined.  I debrided the right groin with scissors and a 15 blade.  Venous bleeding encountered which required a suture for hemostasis.  The groin will probably need more debridement, but OK to contiue with wet to dry dressing changes for now.  Right TMA with ecchymosis and maybe some mottling.  Incision is intact.  I will monitor this for now.  She is still at risk for more proximal amputation.  Keep sutures in for 1 month (about 1 more week)  [name]

## 2017-05-30 ENCOUNTER — Ambulatory Visit (HOSPITAL_COMMUNITY): Payer: No Typology Code available for payment source | Attending: Internal Medicine

## 2017-05-30 DIAGNOSIS — R188 Other ascites: Secondary | ICD-10-CM | POA: Diagnosis not present

## 2017-05-30 LAB — CBC
HEMATOCRIT: 26.3 % — AB (ref 36.0–46.0)
HEMOGLOBIN: 7.9 g/dL — AB (ref 12.0–15.0)
MCH: 27.9 pg (ref 26.0–34.0)
MCHC: 30 g/dL (ref 30.0–36.0)
MCV: 92.9 fL (ref 78.0–100.0)
Platelets: 287 10*3/uL (ref 150–400)
RBC: 2.83 MIL/uL — ABNORMAL LOW (ref 3.87–5.11)
RDW: 16.2 % — AB (ref 11.5–15.5)
WBC: 10.7 10*3/uL — ABNORMAL HIGH (ref 4.0–10.5)

## 2017-05-30 LAB — RENAL FUNCTION PANEL
ALBUMIN: 1.7 g/dL — AB (ref 3.5–5.0)
ANION GAP: 11 (ref 5–15)
BUN: 47 mg/dL — ABNORMAL HIGH (ref 6–20)
CO2: 25 mmol/L (ref 22–32)
Calcium: 8.8 mg/dL — ABNORMAL LOW (ref 8.9–10.3)
Chloride: 99 mmol/L — ABNORMAL LOW (ref 101–111)
Creatinine, Ser: 5.32 mg/dL — ABNORMAL HIGH (ref 0.44–1.00)
GFR calc Af Amer: 11 mL/min — ABNORMAL LOW (ref >60)
GFR, EST NON AFRICAN AMERICAN: 9 mL/min — AB (ref >60)
Glucose, Bld: 246 mg/dL — ABNORMAL HIGH (ref 65–99)
PHOSPHORUS: 3.3 mg/dL (ref 2.5–4.6)
POTASSIUM: 4.4 mmol/L (ref 3.5–5.1)
Sodium: 135 mmol/L (ref 135–145)

## 2017-05-30 NOTE — Progress Notes (Signed)
[facility]  ROUNDING NOTE   Subjective:  Patient seen at bedside. She did complete hemodialysis today. Ultrafiltration achieved again was 1.5 kg.    Objective:  Vital signs in last 24 hours:  Temperature 97.4 pulse 71 respirations 20 blood pressure 184/88  Physical Exam: General: No acute distress  Head: Normocephalic, atraumatic.     Eyes: Anicteric  Neck: decannulated  Lungs:  Clear to auscultation, normal effort  Heart: S1S2 no rubs  Abdomen:  Soft, nontender, bowel sounds present  Extremities: No peripheral edema.  Right foot transmetatarsal amputation  Neurologic: Awake, alert, following commands  Skin: No lesions  Access: LUE AVF    Basic Metabolic Panel: Recent Labs  Lab 05/26/17 0622 05/30/17 0653  NA 135 135  K 4.6 4.4  CL 93* 99*  CO2 27 25  GLUCOSE 35* 246*  BUN 66* 47*  CREATININE 6.39* 5.32*  CALCIUM 9.1 8.8*  PHOS 4.7* 3.3    Liver Function Tests: Recent Labs  Lab 05/26/17 0622 05/30/17 0653  ALBUMIN 1.8* 1.7*   No results for input(s): LIPASE, AMYLASE in the last 168 hours. No results for input(s): AMMONIA in the last 168 hours.  CBC: Recent Labs  Lab 05/24/17 0557 05/26/17 0622 05/28/17 0548 05/30/17 0653  WBC 11.0* 13.4* 14.4* 10.7*  HGB 7.8* 8.7* 7.9* 7.9*  HCT 26.5* 28.5* 25.7* 26.3*  MCV 92.3 92.8 92.8 92.9  PLT 257 293 305 287    Cardiac Enzymes: No results for input(s): CKTOTAL, CKMB, CKMBINDEX, TROPONINI in the last 168 hours.  BNP: Invalid input(s): POCBNP  CBG: No results for input(s): GLUCAP in the last 168 hours.  Microbiology: Results for orders placed or performed during the hospital encounter of 04/28/17  C difficile quick scan w PCR reflex     Status: None   Collection Time: 04/29/17  2:55 PM  Result Value Ref Range Status   C Diff antigen NEGATIVE NEGATIVE Final   C Diff toxin NEGATIVE NEGATIVE Final   C Diff interpretation No C. difficile detected.  Final    Coagulation Studies: No  results for input(s): LABPROT, INR in the last 72 hours.  Urinalysis: No results for input(s): COLORURINE, LABSPEC, PHURINE, GLUCOSEU, HGBUR, BILIRUBINUR, KETONESUR, PROTEINUR, UROBILINOGEN, NITRITE, LEUKOCYTESUR in the last 72 hours.  Invalid input(s): APPERANCEUR    Imaging: No results found.   Medications:       Assessment/ Plan:  42 y.o. white female with a PMHx of ESRD on HD MWF followed by [facility], hypertension, anemia of chronic kidney disease, secondary hyperparathyroidism, restrictive cardiomyopathy, coronary artery disease, peripheral vascular disease who was suffered cardiac arrest on April 16, 2017 as well as April 18, 2017.  The patient suffered her first cardiac arrest during vascular procedure.  She is status post right femoral artery endarterectomy.  During her first cardiac arrest CPR was administered for 2 hours.  She now has ventilator dependent respiratory failure and has a tracheostomy in place.   1.  ESRD on HD MWF: Patient was followed by [facility] while at [facility].   -Patient continues to do well with dialysis.  Dialysis has been completed today and patient achieved ultrafiltration target of 1.5 kg.  2.  Anemia of chronic kidney disease.    Hemoglobin has been stable over the past week with hemoglobin of 7.9.  Maintain the patient on Aranesp 200 mcg subcutaneous daily.  3.  Secondary hyperparathyroidism with hyperphosphatemia:  -Phosphorus currently 3.3 and at target.  4.  Hypertension: - Patient  has elevated blood pressure post dialysis.  Continue amlodipine, clonidine, hydralazine, isosorbide dinitrate, labetalol.  She has history of difficult to control hypertension.   LOS: 0 [name] 1/4/201912:05 PM

## 2017-05-31 LAB — HEPATITIS B SURFACE ANTIGEN: Hepatitis B Surface Ag: NEGATIVE

## 2017-06-02 LAB — RENAL FUNCTION PANEL
Albumin: 1.7 g/dL — ABNORMAL LOW (ref 3.5–5.0)
Anion gap: 15 (ref 5–15)
BUN: 60 mg/dL — AB (ref 6–20)
CALCIUM: 8.9 mg/dL (ref 8.9–10.3)
CO2: 24 mmol/L (ref 22–32)
Chloride: 96 mmol/L — ABNORMAL LOW (ref 101–111)
Creatinine, Ser: 5.92 mg/dL — ABNORMAL HIGH (ref 0.44–1.00)
GFR calc Af Amer: 9 mL/min — ABNORMAL LOW (ref >60)
GFR calc non Af Amer: 8 mL/min — ABNORMAL LOW (ref >60)
GLUCOSE: 165 mg/dL — AB (ref 65–99)
Phosphorus: 3.4 mg/dL (ref 2.5–4.6)
Potassium: 5.5 mmol/L — ABNORMAL HIGH (ref 3.5–5.1)
SODIUM: 135 mmol/L (ref 135–145)

## 2017-06-02 LAB — CBC
HCT: 27.2 % — ABNORMAL LOW (ref 36.0–46.0)
HEMOGLOBIN: 8 g/dL — AB (ref 12.0–15.0)
MCH: 27.1 pg (ref 26.0–34.0)
MCHC: 29.4 g/dL — AB (ref 30.0–36.0)
MCV: 92.2 fL (ref 78.0–100.0)
Platelets: 351 10*3/uL (ref 150–400)
RBC: 2.95 MIL/uL — ABNORMAL LOW (ref 3.87–5.11)
RDW: 16.3 % — AB (ref 11.5–15.5)
WBC: 13.4 10*3/uL — ABNORMAL HIGH (ref 4.0–10.5)

## 2017-06-02 NOTE — Progress Notes (Signed)
[facility]  ROUNDING NOTE   Subjective:  Patient completed hemodialysis today. Tolerated well. Hopefully will have dialysis in the chair during next treatment.   Objective:  Vital signs in last 24 hours:  Temperature 98.5 pulse 70 respirations 18 blood pressure 143/65  Physical Exam: General: No acute distress  Head: Normocephalic, atraumatic.     Eyes: Anicteric  Neck: decannulated  Lungs:  Clear to auscultation, normal effort  Heart: S1S2 no rubs  Abdomen:  Soft, nontender, bowel sounds present  Extremities: No peripheral edema.  Right foot transmetatarsal amputation  Neurologic: Awake, alert, following commands  Skin: No lesions  Access: LUE AVF    Basic Metabolic Panel: Recent Labs  Lab 05/30/17 0653 06/02/17 0705  NA 135 135  K 4.4 5.5*  CL 99* 96*  CO2 25 24  GLUCOSE 246* 165*  BUN 47* 60*  CREATININE 5.32* 5.92*  CALCIUM 8.8* 8.9  PHOS 3.3 3.4    Liver Function Tests: Recent Labs  Lab 05/30/17 0653 06/02/17 0705  ALBUMIN 1.7* 1.7*   No results for input(s): LIPASE, AMYLASE in the last 168 hours. No results for input(s): AMMONIA in the last 168 hours.  CBC: Recent Labs  Lab 05/28/17 0548 05/30/17 0653 06/02/17 0705  WBC 14.4* 10.7* 13.4*  HGB 7.9* 7.9* 8.0*  HCT 25.7* 26.3* 27.2*  MCV 92.8 92.9 92.2  PLT 305 287 351    Cardiac Enzymes: No results for input(s): CKTOTAL, CKMB, CKMBINDEX, TROPONINI in the last 168 hours.  BNP: Invalid input(s): POCBNP  CBG: No results for input(s): GLUCAP in the last 168 hours.  Microbiology: Results for orders placed or performed during the hospital encounter of 04/28/17  C difficile quick scan w PCR reflex     Status: None   Collection Time: 04/29/17  2:55 PM  Result Value Ref Range Status   C Diff antigen NEGATIVE NEGATIVE Final   C Diff toxin NEGATIVE NEGATIVE Final   C Diff interpretation No C. difficile detected.  Final    Coagulation Studies: No results for input(s):  LABPROT, INR in the last 72 hours.  Urinalysis: No results for input(s): COLORURINE, LABSPEC, PHURINE, GLUCOSEU, HGBUR, BILIRUBINUR, KETONESUR, PROTEINUR, UROBILINOGEN, NITRITE, LEUKOCYTESUR in the last 72 hours.  Invalid input(s): APPERANCEUR    Imaging: No results found.   Medications:       Assessment/ Plan:  42 y.o. white female with a PMHx of ESRD on HD MWF followed by [facility], hypertension, anemia of chronic kidney disease, secondary hyperparathyroidism, restrictive cardiomyopathy, coronary artery disease, peripheral vascular disease who was suffered cardiac arrest on April 16, 2017 as well as April 18, 2017.  The patient suffered her first cardiac arrest during vascular procedure.  She is status post right femoral artery endarterectomy.  During her first cardiac arrest CPR was administered for 2 hours.  She now has ventilator dependent respiratory failure and has a tracheostomy in place.   1.  ESRD on HD MWF: Patient was followed by [facility] while at [facility].   -Patient continues to tolerate dialysis well.  We will plan for dialysis again on Wednesday.  2.  Anemia of chronic kidney disease.    Hemoglobin stable at 8.  Maintain the patient on maximum dosage Aranesp.  3.  Secondary hyperparathyroidism with hyperphosphatemia:  -Phosphorus at target at 3.4.  Continue to monitor.  4.  Hypertension: -Blood pressure well controlled at the moment at 143/65.  Continue amlodipine, clonidine, hydralazine, isosorbide dinitrate, labetalol.   LOS: 0 [name],  [name] 1/7/20194:02 PM

## 2017-06-04 LAB — CBC
HEMATOCRIT: 27.6 % — AB (ref 36.0–46.0)
HEMOGLOBIN: 8.1 g/dL — AB (ref 12.0–15.0)
MCH: 27 pg (ref 26.0–34.0)
MCHC: 29.3 g/dL — AB (ref 30.0–36.0)
MCV: 92 fL (ref 78.0–100.0)
Platelets: 402 10*3/uL — ABNORMAL HIGH (ref 150–400)
RBC: 3 MIL/uL — ABNORMAL LOW (ref 3.87–5.11)
RDW: 16.5 % — ABNORMAL HIGH (ref 11.5–15.5)
WBC: 13.5 10*3/uL — ABNORMAL HIGH (ref 4.0–10.5)

## 2017-06-04 NOTE — Progress Notes (Signed)
[facility]  ROUNDING NOTE   Subjective:  Patient sitting up in bed at the moment. She did complete hemodialysis..   Objective:  Vital signs in last 24 hours:  Temperature 96.5 pulse 84 respirations 16 blood pressure 185/64  Physical Exam: General: No acute distress  Head: Normocephalic, atraumatic.     Eyes: Anicteric  Neck: decannulated  Lungs:  Clear to auscultation, normal effort  Heart: S1S2 no rubs  Abdomen:  Soft, nontender, bowel sounds present  Extremities: No peripheral edema.  Right foot transmetatarsal amputation  Neurologic: Awake, alert, following commands  Skin: No rash  Access: LUE AVF    Basic Metabolic Panel: Recent Labs  Lab 05/30/17 0653 06/02/17 0705  NA 135 135  K 4.4 5.5*  CL 99* 96*  CO2 25 24  GLUCOSE 246* 165*  BUN 47* 60*  CREATININE 5.32* 5.92*  CALCIUM 8.8* 8.9  PHOS 3.3 3.4    Liver Function Tests: Recent Labs  Lab 05/30/17 0653 06/02/17 0705  ALBUMIN 1.7* 1.7*   No results for input(s): LIPASE, AMYLASE in the last 168 hours. No results for input(s): AMMONIA in the last 168 hours.  CBC: Recent Labs  Lab 05/30/17 0653 06/02/17 0705 06/04/17 0611  WBC 10.7* 13.4* 13.5*  HGB 7.9* 8.0* 8.1*  HCT 26.3* 27.2* 27.6*  MCV 92.9 92.2 92.0  PLT 287 351 402*    Cardiac Enzymes: No results for input(s): CKTOTAL, CKMB, CKMBINDEX, TROPONINI in the last 168 hours.  BNP: Invalid input(s): POCBNP  CBG: No results for input(s): GLUCAP in the last 168 hours.  Microbiology: Results for orders placed or performed during the hospital encounter of 04/28/17  C difficile quick scan w PCR reflex     Status: None   Collection Time: 04/29/17  2:55 PM  Result Value Ref Range Status   C Diff antigen NEGATIVE NEGATIVE Final   C Diff toxin NEGATIVE NEGATIVE Final   C Diff interpretation No C. difficile detected.  Final    Coagulation Studies: No results for input(s): LABPROT, INR in the last 72 hours.  Urinalysis: No  results for input(s): COLORURINE, LABSPEC, PHURINE, GLUCOSEU, HGBUR, BILIRUBINUR, KETONESUR, PROTEINUR, UROBILINOGEN, NITRITE, LEUKOCYTESUR in the last 72 hours.  Invalid input(s): APPERANCEUR    Imaging: No results found.   Medications:       Assessment/ Plan:  42 y.o. white female with a PMHx of ESRD on HD MWF followed by [facility], hypertension, anemia of chronic kidney disease, secondary hyperparathyroidism, restrictive cardiomyopathy, coronary artery disease, peripheral vascular disease who was suffered cardiac arrest on April 16, 2017 as well as April 18, 2017.  The patient suffered her first cardiac arrest during vascular procedure.  She is status post right femoral artery endarterectomy.  During her first cardiac arrest CPR was administered for 2 hours.  She now has ventilator dependent respiratory failure and has a tracheostomy in place.   1.  ESRD on HD MWF: Patient was followed by [facility] while at [facility].   -Patient completed hemodialysis today.  We will plan for dialysis again on Friday.  2.  Anemia of chronic kidney disease.    Hemoglobin slightly higher today at 8.1.  Maintain the patient on Aranesp and monitor CBC.  3.  Secondary hyperparathyroidism with hyperphosphatemia:  -Phosphorus remains at target at 3.4.  4.  Hypertension: -Pressure continues to be labile ending up on time of day.  Continue amlodipine, clonidine, hydralazine, isosorbide dinitrate, labetalol.   LOS: 0 Rickie Gutierres 1/9/20193:56 PM

## 2017-06-05 ENCOUNTER — Telehealth: Payer: Self-pay | Admitting: Surgery

## 2017-06-05 NOTE — Telephone Encounter (Signed)
-----   Message from Mena Goes, RN sent at 06/05/2017  2:19 PM EST ----- Regarding: 2 weeks to discuss surgery   ----- Message ----- From: Ulyses Amor, PA-C Sent: 06/05/2017  11:38 AM To: Vvs Charge Pool  F/U with Dr. Trula Slade in 2 weeks to discuss surgery she is being discharged to a SNF soon.

## 2017-06-05 NOTE — Telephone Encounter (Signed)
Sched appt 06/16/17 at 3:00. Lm on  Cell#.

## 2017-06-06 LAB — RENAL FUNCTION PANEL
ALBUMIN: 1.7 g/dL — AB (ref 3.5–5.0)
Anion gap: 15 (ref 5–15)
BUN: 56 mg/dL — ABNORMAL HIGH (ref 6–20)
CO2: 22 mmol/L (ref 22–32)
Calcium: 8.9 mg/dL (ref 8.9–10.3)
Chloride: 97 mmol/L — ABNORMAL LOW (ref 101–111)
Creatinine, Ser: 5.44 mg/dL — ABNORMAL HIGH (ref 0.44–1.00)
GFR calc Af Amer: 10 mL/min — ABNORMAL LOW (ref >60)
GFR calc non Af Amer: 9 mL/min — ABNORMAL LOW (ref >60)
GLUCOSE: 130 mg/dL — AB (ref 65–99)
PHOSPHORUS: 3.3 mg/dL (ref 2.5–4.6)
POTASSIUM: 5.6 mmol/L — AB (ref 3.5–5.1)
Sodium: 134 mmol/L — ABNORMAL LOW (ref 135–145)

## 2017-06-06 LAB — CBC
HEMATOCRIT: 26.6 % — AB (ref 36.0–46.0)
HEMOGLOBIN: 7.7 g/dL — AB (ref 12.0–15.0)
MCH: 26.6 pg (ref 26.0–34.0)
MCHC: 28.9 g/dL — AB (ref 30.0–36.0)
MCV: 92 fL (ref 78.0–100.0)
Platelets: 305 10*3/uL (ref 150–400)
RBC: 2.89 MIL/uL — ABNORMAL LOW (ref 3.87–5.11)
RDW: 16.6 % — AB (ref 11.5–15.5)
WBC: 11.9 10*3/uL — ABNORMAL HIGH (ref 4.0–10.5)

## 2017-06-06 NOTE — Progress Notes (Signed)
  [facility]  ROUNDING NOTE   Subjective:  Patient seen at bedside. Resting comfortably. Due for dialysis today.   Objective:  Vital signs in last 24 hours:  Temperature 98.4 pulse 78 respirations 20 blood pressure 138/65  Physical Exam: General: No acute distress  Head: Normocephalic, atraumatic.     Eyes: Anicteric  Neck: decannulated  Lungs:  Clear to auscultation, normal effort  Heart: S1S2 no rubs  Abdomen:  Soft, nontender, bowel sounds present  Extremities: No peripheral edema.  Right foot transmetatarsal amputation  Neurologic: Awake, alert, following commands  Skin: No rash  Access: LUE AVF    Basic Metabolic Panel: Recent Labs  Lab 06/02/17 0705  NA 135  K 5.5*  CL 96*  CO2 24  GLUCOSE 165*  BUN 60*  CREATININE 5.92*  CALCIUM 8.9  PHOS 3.4    Liver Function Tests: Recent Labs  Lab 06/02/17 0705  ALBUMIN 1.7*   No results for input(s): LIPASE, AMYLASE in the last 168 hours. No results for input(s): AMMONIA in the last 168 hours.  CBC: Recent Labs  Lab 06/02/17 0705 06/04/17 0611  WBC 13.4* 13.5*  HGB 8.0* 8.1*  HCT 27.2* 27.6*  MCV 92.2 92.0  PLT 351 402*    Cardiac Enzymes: No results for input(s): CKTOTAL, CKMB, CKMBINDEX, TROPONINI in the last 168 hours.  BNP: Invalid input(s): POCBNP  CBG: No results for input(s): GLUCAP in the last 168 hours.  Microbiology: Results for orders placed or performed during the hospital encounter of 04/28/17  C difficile quick scan w PCR reflex     Status: None   Collection Time: 04/29/17  2:55 PM  Result Value Ref Range Status   C Diff antigen NEGATIVE NEGATIVE Final   C Diff toxin NEGATIVE NEGATIVE Final   C Diff interpretation No C. difficile detected.  Final    Coagulation Studies: No results for input(s): LABPROT, INR in the last 72 hours.  Urinalysis: No results for input(s): COLORURINE, LABSPEC, PHURINE, GLUCOSEU, HGBUR, BILIRUBINUR, KETONESUR, PROTEINUR, UROBILINOGEN,  NITRITE, LEUKOCYTESUR in the last 72 hours.  Invalid input(s): APPERANCEUR    Imaging: No results found.   Medications:       Assessment/ Plan:  42 y.o. white female with a PMHx of ESRD on HD MWF followed by [facility], hypertension, anemia of chronic kidney disease, secondary hyperparathyroidism, restrictive cardiomyopathy, coronary artery disease, peripheral vascular disease who was suffered cardiac arrest on April 16, 2017 as well as April 18, 2017.  The patient suffered her first cardiac arrest during vascular procedure.  She is status post right femoral artery endarterectomy.  During her first cardiac arrest CPR was administered for 2 hours.  She now has ventilator dependent respiratory failure and has a tracheostomy in place.   1.  ESRD on HD MWF: Patient was followed by [facility] while at [facility].   -Patient due for hemodialysis today as per her normal schedule.  Orders have been prepared.  2.  Anemia of chronic kidney disease.    Repeat CBC today.  Continue high-dose Aranesp.  3.  Secondary hyperparathyroidism with hyperphosphatemia:  -Repeat serum phosphorus.  Previously serum phosphorus was under good control.  4.  Hypertension: -Blood pressure is very good at the moment at 138/65.  Continue amlodipine, clonidine, hydralazine, isosorbide dinitrate, labetalol.   LOS: 0 [name] 1/11/20198:18 AM

## 2017-06-09 LAB — RENAL FUNCTION PANEL
Albumin: 1.6 g/dL — ABNORMAL LOW (ref 3.5–5.0)
Anion gap: 17 — ABNORMAL HIGH (ref 5–15)
BUN: 70 mg/dL — ABNORMAL HIGH (ref 6–20)
CHLORIDE: 98 mmol/L — AB (ref 101–111)
CO2: 22 mmol/L (ref 22–32)
Calcium: 9 mg/dL (ref 8.9–10.3)
Creatinine, Ser: 6.14 mg/dL — ABNORMAL HIGH (ref 0.44–1.00)
GFR, EST AFRICAN AMERICAN: 9 mL/min — AB (ref >60)
GFR, EST NON AFRICAN AMERICAN: 8 mL/min — AB (ref >60)
Glucose, Bld: 161 mg/dL — ABNORMAL HIGH (ref 65–99)
POTASSIUM: 5.7 mmol/L — AB (ref 3.5–5.1)
Phosphorus: 4 mg/dL (ref 2.5–4.6)
Sodium: 137 mmol/L (ref 135–145)

## 2017-06-09 LAB — CBC
HEMATOCRIT: 24.9 % — AB (ref 36.0–46.0)
Hemoglobin: 7.5 g/dL — ABNORMAL LOW (ref 12.0–15.0)
MCH: 27.4 pg (ref 26.0–34.0)
MCHC: 30.1 g/dL (ref 30.0–36.0)
MCV: 90.9 fL (ref 78.0–100.0)
Platelets: 282 10*3/uL (ref 150–400)
RBC: 2.74 MIL/uL — AB (ref 3.87–5.11)
RDW: 16.6 % — AB (ref 11.5–15.5)
WBC: 9.5 10*3/uL (ref 4.0–10.5)

## 2017-06-09 NOTE — Progress Notes (Signed)
[facility]  ROUNDING NOTE   Subjective:  Patient just completed hemodialysis. Ultrafiltration achieved was 1.9 kg.    Objective:  Vital signs in last 24 hours:  Temperature 98.4 pulse 81 respirations 18 blood pressure 178/87  Physical Exam: General: No acute distress  Head: Normocephalic, atraumatic.     Eyes: Anicteric  Neck: decannulated  Lungs:  Clear to auscultation, normal effort  Heart: S1S2 no rubs  Abdomen:  Soft, nontender, bowel sounds present  Extremities: No peripheral edema.  Right foot transmetatarsal amputation  Neurologic: Awake, alert, following commands  Skin: No rash  Access: LUE AVF    Basic Metabolic Panel: Recent Labs  Lab 06/06/17 0711 06/09/17 0500  NA 134* 137  K 5.6* 5.7*  CL 97* 98*  CO2 22 22  GLUCOSE 130* 161*  BUN 56* 70*  CREATININE 5.44* 6.14*  CALCIUM 8.9 9.0  PHOS 3.3 4.0    Liver Function Tests: Recent Labs  Lab 06/06/17 0711 06/09/17 0500  ALBUMIN 1.7* 1.6*   No results for input(s): LIPASE, AMYLASE in the last 168 hours. No results for input(s): AMMONIA in the last 168 hours.  CBC: Recent Labs  Lab 06/04/17 0611 06/06/17 0711 06/09/17 0500  WBC 13.5* 11.9* 9.5  HGB 8.1* 7.7* 7.5*  HCT 27.6* 26.6* 24.9*  MCV 92.0 92.0 90.9  PLT 402* 305 282    Cardiac Enzymes: No results for input(s): CKTOTAL, CKMB, CKMBINDEX, TROPONINI in the last 168 hours.  BNP: Invalid input(s): POCBNP  CBG: No results for input(s): GLUCAP in the last 168 hours.  Microbiology: Results for orders placed or performed during the hospital encounter of 04/28/17  C difficile quick scan w PCR reflex     Status: None   Collection Time: 04/29/17  2:55 PM  Result Value Ref Range Status   C Diff antigen NEGATIVE NEGATIVE Final   C Diff toxin NEGATIVE NEGATIVE Final   C Diff interpretation No C. difficile detected.  Final    Coagulation Studies: No results for input(s): LABPROT, INR in the last 72 hours.  Urinalysis: No  results for input(s): COLORURINE, LABSPEC, PHURINE, GLUCOSEU, HGBUR, BILIRUBINUR, KETONESUR, PROTEINUR, UROBILINOGEN, NITRITE, LEUKOCYTESUR in the last 72 hours.  Invalid input(s): APPERANCEUR    Imaging: No results found.   Medications:       Assessment/ Plan:  42 y.o. white female with a PMHx of ESRD on HD MWF followed by [facility], hypertension, anemia of chronic kidney disease, secondary hyperparathyroidism, restrictive cardiomyopathy, coronary artery disease, peripheral vascular disease who was suffered cardiac arrest on April 16, 2017 as well as April 18, 2017.  The patient suffered her first cardiac arrest during vascular procedure.  She is status post right femoral artery endarterectomy.  During her first cardiac arrest CPR was administered for 2 hours.  She now has ventilator dependent respiratory failure and has a tracheostomy in place.   1.  ESRD on HD MWF: Patient was followed by [facility] while at [facility].   -Patient completed hemodialysis today.  Ultrafiltration achieved was 1.9 kg.  We will plan for dialysis again on Wednesday.  2.  Anemia of chronic kidney disease.    Hemoglobin currently 7.5 despite high-dose Aranesp.  Continue to monitor CBC.  3.  Secondary hyperparathyroidism with hyperphosphatemia:  -Phosphorus now under good control at 4.0.  Continue to monitor.  4.  Hypertension: -Patient tends to have better blood pressures in the a.m.'s.  She has labile blood pressures.  Continue amlodipine, clonidine, hydralazine, isosorbide dinitrate,  labetalol.   LOS: 0 Chrisy Hillebrand 1/14/20193:11 PM

## 2017-06-10 DIAGNOSIS — J9600 Acute respiratory failure, unspecified whether with hypoxia or hypercapnia: Secondary | ICD-10-CM | POA: Diagnosis not present

## 2017-06-10 DIAGNOSIS — G8918 Other acute postprocedural pain: Secondary | ICD-10-CM | POA: Diagnosis not present

## 2017-06-10 DIAGNOSIS — E1322 Other specified diabetes mellitus with diabetic chronic kidney disease: Secondary | ICD-10-CM | POA: Diagnosis not present

## 2017-06-10 DIAGNOSIS — T148XXD Other injury of unspecified body region, subsequent encounter: Secondary | ICD-10-CM | POA: Diagnosis not present

## 2017-06-10 DIAGNOSIS — E1151 Type 2 diabetes mellitus with diabetic peripheral angiopathy without gangrene: Secondary | ICD-10-CM | POA: Diagnosis not present

## 2017-06-10 DIAGNOSIS — L089 Local infection of the skin and subcutaneous tissue, unspecified: Secondary | ICD-10-CM | POA: Diagnosis not present

## 2017-06-10 DIAGNOSIS — I120 Hypertensive chronic kidney disease with stage 5 chronic kidney disease or end stage renal disease: Secondary | ICD-10-CM | POA: Diagnosis not present

## 2017-06-10 DIAGNOSIS — I5022 Chronic systolic (congestive) heart failure: Secondary | ICD-10-CM | POA: Diagnosis present

## 2017-06-10 DIAGNOSIS — G9389 Other specified disorders of brain: Secondary | ICD-10-CM | POA: Diagnosis not present

## 2017-06-10 DIAGNOSIS — D631 Anemia in chronic kidney disease: Secondary | ICD-10-CM | POA: Diagnosis present

## 2017-06-10 DIAGNOSIS — J9690 Respiratory failure, unspecified, unspecified whether with hypoxia or hypercapnia: Secondary | ICD-10-CM | POA: Diagnosis not present

## 2017-06-10 DIAGNOSIS — R488 Other symbolic dysfunctions: Secondary | ICD-10-CM | POA: Diagnosis not present

## 2017-06-10 DIAGNOSIS — N189 Chronic kidney disease, unspecified: Secondary | ICD-10-CM | POA: Diagnosis not present

## 2017-06-10 DIAGNOSIS — G8929 Other chronic pain: Secondary | ICD-10-CM | POA: Diagnosis not present

## 2017-06-10 DIAGNOSIS — N186 End stage renal disease: Secondary | ICD-10-CM | POA: Diagnosis not present

## 2017-06-10 DIAGNOSIS — I132 Hypertensive heart and chronic kidney disease with heart failure and with stage 5 chronic kidney disease, or end stage renal disease: Secondary | ICD-10-CM | POA: Diagnosis present

## 2017-06-10 DIAGNOSIS — E1122 Type 2 diabetes mellitus with diabetic chronic kidney disease: Secondary | ICD-10-CM | POA: Diagnosis present

## 2017-06-10 DIAGNOSIS — I425 Other restrictive cardiomyopathy: Secondary | ICD-10-CM | POA: Diagnosis present

## 2017-06-10 DIAGNOSIS — S31109D Unspecified open wound of abdominal wall, unspecified quadrant without penetration into peritoneal cavity, subsequent encounter: Secondary | ICD-10-CM | POA: Diagnosis not present

## 2017-06-10 DIAGNOSIS — M25551 Pain in right hip: Secondary | ICD-10-CM | POA: Diagnosis not present

## 2017-06-10 DIAGNOSIS — G4489 Other headache syndrome: Secondary | ICD-10-CM | POA: Diagnosis not present

## 2017-06-10 DIAGNOSIS — L8990 Pressure ulcer of unspecified site, unspecified stage: Secondary | ICD-10-CM | POA: Diagnosis not present

## 2017-06-10 DIAGNOSIS — G939 Disorder of brain, unspecified: Secondary | ICD-10-CM | POA: Diagnosis not present

## 2017-06-10 DIAGNOSIS — Z89431 Acquired absence of right foot: Secondary | ICD-10-CM | POA: Diagnosis not present

## 2017-06-10 DIAGNOSIS — G44319 Acute post-traumatic headache, not intractable: Secondary | ICD-10-CM | POA: Diagnosis not present

## 2017-06-10 DIAGNOSIS — L89154 Pressure ulcer of sacral region, stage 4: Secondary | ICD-10-CM | POA: Diagnosis not present

## 2017-06-10 DIAGNOSIS — R188 Other ascites: Secondary | ICD-10-CM | POA: Diagnosis not present

## 2017-06-10 DIAGNOSIS — R269 Unspecified abnormalities of gait and mobility: Secondary | ICD-10-CM | POA: Diagnosis not present

## 2017-06-10 DIAGNOSIS — T8781 Dehiscence of amputation stump: Secondary | ICD-10-CM | POA: Diagnosis not present

## 2017-06-10 DIAGNOSIS — W06XXXA Fall from bed, initial encounter: Secondary | ICD-10-CM | POA: Diagnosis present

## 2017-06-10 DIAGNOSIS — R1310 Dysphagia, unspecified: Secondary | ICD-10-CM | POA: Diagnosis not present

## 2017-06-10 DIAGNOSIS — R011 Cardiac murmur, unspecified: Secondary | ICD-10-CM | POA: Diagnosis present

## 2017-06-10 DIAGNOSIS — M4628 Osteomyelitis of vertebra, sacral and sacrococcygeal region: Secondary | ICD-10-CM | POA: Diagnosis not present

## 2017-06-10 DIAGNOSIS — E8770 Fluid overload, unspecified: Secondary | ICD-10-CM | POA: Diagnosis not present

## 2017-06-10 DIAGNOSIS — Z89511 Acquired absence of right leg below knee: Secondary | ICD-10-CM | POA: Diagnosis not present

## 2017-06-10 DIAGNOSIS — W19XXXA Unspecified fall, initial encounter: Secondary | ICD-10-CM | POA: Diagnosis not present

## 2017-06-10 DIAGNOSIS — S3993XA Unspecified injury of pelvis, initial encounter: Secondary | ICD-10-CM | POA: Diagnosis not present

## 2017-06-10 DIAGNOSIS — M6281 Muscle weakness (generalized): Secondary | ICD-10-CM | POA: Diagnosis not present

## 2017-06-10 DIAGNOSIS — M86271 Subacute osteomyelitis, right ankle and foot: Secondary | ICD-10-CM | POA: Diagnosis not present

## 2017-06-10 DIAGNOSIS — N179 Acute kidney failure, unspecified: Secondary | ICD-10-CM | POA: Diagnosis not present

## 2017-06-10 DIAGNOSIS — S8991XA Unspecified injury of right lower leg, initial encounter: Secondary | ICD-10-CM | POA: Diagnosis not present

## 2017-06-10 DIAGNOSIS — T8753 Necrosis of amputation stump, right lower extremity: Secondary | ICD-10-CM | POA: Diagnosis present

## 2017-06-10 DIAGNOSIS — E1365 Other specified diabetes mellitus with hyperglycemia: Secondary | ICD-10-CM | POA: Diagnosis not present

## 2017-06-10 DIAGNOSIS — Z6820 Body mass index (BMI) 20.0-20.9, adult: Secondary | ICD-10-CM | POA: Diagnosis not present

## 2017-06-10 DIAGNOSIS — E46 Unspecified protein-calorie malnutrition: Secondary | ICD-10-CM | POA: Diagnosis not present

## 2017-06-10 DIAGNOSIS — E1169 Type 2 diabetes mellitus with other specified complication: Secondary | ICD-10-CM | POA: Diagnosis present

## 2017-06-10 DIAGNOSIS — Y835 Amputation of limb(s) as the cause of abnormal reaction of the patient, or of later complication, without mention of misadventure at the time of the procedure: Secondary | ICD-10-CM | POA: Diagnosis present

## 2017-06-10 DIAGNOSIS — G546 Phantom limb syndrome with pain: Secondary | ICD-10-CM | POA: Diagnosis not present

## 2017-06-10 DIAGNOSIS — Z48817 Encounter for surgical aftercare following surgery on the skin and subcutaneous tissue: Secondary | ICD-10-CM | POA: Diagnosis not present

## 2017-06-10 DIAGNOSIS — I2510 Atherosclerotic heart disease of native coronary artery without angina pectoris: Secondary | ICD-10-CM | POA: Diagnosis present

## 2017-06-10 DIAGNOSIS — Y92122 Bedroom in nursing home as the place of occurrence of the external cause: Secondary | ICD-10-CM | POA: Diagnosis not present

## 2017-06-10 DIAGNOSIS — S79911A Unspecified injury of right hip, initial encounter: Secondary | ICD-10-CM | POA: Diagnosis not present

## 2017-06-10 DIAGNOSIS — Z992 Dependence on renal dialysis: Secondary | ICD-10-CM | POA: Diagnosis not present

## 2017-06-10 DIAGNOSIS — I10 Essential (primary) hypertension: Secondary | ICD-10-CM | POA: Diagnosis not present

## 2017-06-10 DIAGNOSIS — T8189XA Other complications of procedures, not elsewhere classified, initial encounter: Secondary | ICD-10-CM | POA: Diagnosis present

## 2017-06-10 DIAGNOSIS — Z4781 Encounter for orthopedic aftercare following surgical amputation: Secondary | ICD-10-CM | POA: Diagnosis not present

## 2017-06-10 DIAGNOSIS — I739 Peripheral vascular disease, unspecified: Secondary | ICD-10-CM | POA: Diagnosis not present

## 2017-06-10 DIAGNOSIS — M24569 Contracture, unspecified knee: Secondary | ICD-10-CM | POA: Diagnosis not present

## 2017-06-10 DIAGNOSIS — I96 Gangrene, not elsewhere classified: Secondary | ICD-10-CM | POA: Diagnosis not present

## 2017-06-10 DIAGNOSIS — L89150 Pressure ulcer of sacral region, unstageable: Secondary | ICD-10-CM | POA: Diagnosis not present

## 2017-06-10 DIAGNOSIS — X58XXXD Exposure to other specified factors, subsequent encounter: Secondary | ICD-10-CM | POA: Diagnosis not present

## 2017-06-10 DIAGNOSIS — S06350A Traumatic hemorrhage of left cerebrum without loss of consciousness, initial encounter: Secondary | ICD-10-CM | POA: Diagnosis present

## 2017-06-10 DIAGNOSIS — I5030 Unspecified diastolic (congestive) heart failure: Secondary | ICD-10-CM | POA: Diagnosis not present

## 2017-06-10 DIAGNOSIS — S0990XA Unspecified injury of head, initial encounter: Secondary | ICD-10-CM | POA: Diagnosis not present

## 2017-06-10 DIAGNOSIS — R51 Headache: Secondary | ICD-10-CM | POA: Diagnosis not present

## 2017-06-10 DIAGNOSIS — N2581 Secondary hyperparathyroidism of renal origin: Secondary | ICD-10-CM | POA: Diagnosis present

## 2017-06-10 DIAGNOSIS — G936 Cerebral edema: Secondary | ICD-10-CM | POA: Diagnosis not present

## 2017-06-10 DIAGNOSIS — E43 Unspecified severe protein-calorie malnutrition: Secondary | ICD-10-CM | POA: Diagnosis present

## 2017-06-10 DIAGNOSIS — Z978 Presence of other specified devices: Secondary | ICD-10-CM | POA: Diagnosis not present

## 2017-06-10 DIAGNOSIS — M8610 Other acute osteomyelitis, unspecified site: Secondary | ICD-10-CM | POA: Diagnosis not present

## 2017-06-10 DIAGNOSIS — M25561 Pain in right knee: Secondary | ICD-10-CM | POA: Diagnosis not present

## 2017-06-10 DIAGNOSIS — E1140 Type 2 diabetes mellitus with diabetic neuropathy, unspecified: Secondary | ICD-10-CM | POA: Diagnosis not present

## 2017-06-10 DIAGNOSIS — E1152 Type 2 diabetes mellitus with diabetic peripheral angiopathy with gangrene: Secondary | ICD-10-CM | POA: Diagnosis present

## 2017-06-10 DIAGNOSIS — S2249XD Multiple fractures of ribs, unspecified side, subsequent encounter for fracture with routine healing: Secondary | ICD-10-CM | POA: Diagnosis not present

## 2017-06-10 DIAGNOSIS — L02214 Cutaneous abscess of groin: Secondary | ICD-10-CM | POA: Diagnosis not present

## 2017-06-10 DIAGNOSIS — K7011 Alcoholic hepatitis with ascites: Secondary | ICD-10-CM | POA: Diagnosis not present

## 2017-06-11 ENCOUNTER — Emergency Department (HOSPITAL_COMMUNITY): Payer: Medicare Other

## 2017-06-11 ENCOUNTER — Inpatient Hospital Stay (HOSPITAL_COMMUNITY): Payer: Medicare Other

## 2017-06-11 ENCOUNTER — Encounter: Payer: Self-pay | Admitting: Internal Medicine

## 2017-06-11 ENCOUNTER — Encounter (HOSPITAL_COMMUNITY): Payer: Self-pay

## 2017-06-11 ENCOUNTER — Non-Acute Institutional Stay (SKILLED_NURSING_FACILITY): Payer: Medicare Other | Admitting: Internal Medicine

## 2017-06-11 ENCOUNTER — Inpatient Hospital Stay (HOSPITAL_COMMUNITY)
Admission: EM | Admit: 2017-06-11 | Discharge: 2017-06-23 | DRG: 040 | Disposition: A | Discharge status: Rehabilitation Facility | Payer: Medicare Other | Attending: Family Medicine | Admitting: Family Medicine

## 2017-06-11 ENCOUNTER — Telehealth: Payer: Self-pay

## 2017-06-11 ENCOUNTER — Other Ambulatory Visit: Payer: Self-pay

## 2017-06-11 DIAGNOSIS — S88111S Complete traumatic amputation at level between knee and ankle, right lower leg, sequela: Secondary | ICD-10-CM | POA: Diagnosis not present

## 2017-06-11 DIAGNOSIS — I2510 Atherosclerotic heart disease of native coronary artery without angina pectoris: Secondary | ICD-10-CM | POA: Diagnosis not present

## 2017-06-11 DIAGNOSIS — L97319 Non-pressure chronic ulcer of right ankle with unspecified severity: Secondary | ICD-10-CM | POA: Diagnosis not present

## 2017-06-11 DIAGNOSIS — I5022 Chronic systolic (congestive) heart failure: Secondary | ICD-10-CM | POA: Diagnosis present

## 2017-06-11 DIAGNOSIS — M869 Osteomyelitis, unspecified: Secondary | ICD-10-CM | POA: Diagnosis not present

## 2017-06-11 DIAGNOSIS — E1151 Type 2 diabetes mellitus with diabetic peripheral angiopathy without gangrene: Secondary | ICD-10-CM | POA: Diagnosis not present

## 2017-06-11 DIAGNOSIS — R51 Headache: Secondary | ICD-10-CM | POA: Diagnosis not present

## 2017-06-11 DIAGNOSIS — G8918 Other acute postprocedural pain: Secondary | ICD-10-CM | POA: Diagnosis not present

## 2017-06-11 DIAGNOSIS — E1322 Other specified diabetes mellitus with diabetic chronic kidney disease: Secondary | ICD-10-CM | POA: Diagnosis not present

## 2017-06-11 DIAGNOSIS — Z86001 Personal history of in-situ neoplasm of cervix uteri: Secondary | ICD-10-CM

## 2017-06-11 DIAGNOSIS — S06350A Traumatic hemorrhage of left cerebrum without loss of consciousness, initial encounter: Principal | ICD-10-CM | POA: Diagnosis present

## 2017-06-11 DIAGNOSIS — Z992 Dependence on renal dialysis: Secondary | ICD-10-CM | POA: Diagnosis not present

## 2017-06-11 DIAGNOSIS — I96 Gangrene, not elsewhere classified: Secondary | ICD-10-CM | POA: Diagnosis present

## 2017-06-11 DIAGNOSIS — Z961 Presence of intraocular lens: Secondary | ICD-10-CM | POA: Diagnosis present

## 2017-06-11 DIAGNOSIS — E8770 Fluid overload, unspecified: Secondary | ICD-10-CM | POA: Diagnosis present

## 2017-06-11 DIAGNOSIS — G9389 Other specified disorders of brain: Secondary | ICD-10-CM

## 2017-06-11 DIAGNOSIS — Z48817 Encounter for surgical aftercare following surgery on the skin and subcutaneous tissue: Secondary | ICD-10-CM | POA: Diagnosis not present

## 2017-06-11 DIAGNOSIS — M25551 Pain in right hip: Secondary | ICD-10-CM | POA: Diagnosis not present

## 2017-06-11 DIAGNOSIS — M24569 Contracture, unspecified knee: Secondary | ICD-10-CM | POA: Diagnosis not present

## 2017-06-11 DIAGNOSIS — Z6820 Body mass index (BMI) 20.0-20.9, adult: Secondary | ICD-10-CM

## 2017-06-11 DIAGNOSIS — Y835 Amputation of limb(s) as the cause of abnormal reaction of the patient, or of later complication, without mention of misadventure at the time of the procedure: Secondary | ICD-10-CM | POA: Diagnosis present

## 2017-06-11 DIAGNOSIS — Z8674 Personal history of sudden cardiac arrest: Secondary | ICD-10-CM

## 2017-06-11 DIAGNOSIS — M25561 Pain in right knee: Secondary | ICD-10-CM

## 2017-06-11 DIAGNOSIS — Z89511 Acquired absence of right leg below knee: Secondary | ICD-10-CM | POA: Diagnosis not present

## 2017-06-11 DIAGNOSIS — L89154 Pressure ulcer of sacral region, stage 4: Secondary | ICD-10-CM | POA: Diagnosis not present

## 2017-06-11 DIAGNOSIS — Z1401 Asymptomatic hemophilia A carrier: Secondary | ICD-10-CM | POA: Diagnosis not present

## 2017-06-11 DIAGNOSIS — X58XXXD Exposure to other specified factors, subsequent encounter: Secondary | ICD-10-CM | POA: Diagnosis not present

## 2017-06-11 DIAGNOSIS — Z7982 Long term (current) use of aspirin: Secondary | ICD-10-CM

## 2017-06-11 DIAGNOSIS — G44319 Acute post-traumatic headache, not intractable: Secondary | ICD-10-CM | POA: Diagnosis not present

## 2017-06-11 DIAGNOSIS — R52 Pain, unspecified: Secondary | ICD-10-CM | POA: Diagnosis not present

## 2017-06-11 DIAGNOSIS — I739 Peripheral vascular disease, unspecified: Secondary | ICD-10-CM | POA: Diagnosis not present

## 2017-06-11 DIAGNOSIS — M1990 Unspecified osteoarthritis, unspecified site: Secondary | ICD-10-CM | POA: Diagnosis present

## 2017-06-11 DIAGNOSIS — Z9842 Cataract extraction status, left eye: Secondary | ICD-10-CM

## 2017-06-11 DIAGNOSIS — G43909 Migraine, unspecified, not intractable, without status migrainosus: Secondary | ICD-10-CM | POA: Diagnosis present

## 2017-06-11 DIAGNOSIS — R7309 Other abnormal glucose: Secondary | ICD-10-CM | POA: Diagnosis not present

## 2017-06-11 DIAGNOSIS — E1143 Type 2 diabetes mellitus with diabetic autonomic (poly)neuropathy: Secondary | ICD-10-CM | POA: Diagnosis present

## 2017-06-11 DIAGNOSIS — M4628 Osteomyelitis of vertebra, sacral and sacrococcygeal region: Secondary | ICD-10-CM | POA: Diagnosis not present

## 2017-06-11 DIAGNOSIS — Z9862 Peripheral vascular angioplasty status: Secondary | ICD-10-CM | POA: Diagnosis not present

## 2017-06-11 DIAGNOSIS — E781 Pure hyperglyceridemia: Secondary | ICD-10-CM | POA: Diagnosis present

## 2017-06-11 DIAGNOSIS — K3184 Gastroparesis: Secondary | ICD-10-CM | POA: Diagnosis present

## 2017-06-11 DIAGNOSIS — D62 Acute posthemorrhagic anemia: Secondary | ICD-10-CM | POA: Diagnosis not present

## 2017-06-11 DIAGNOSIS — D631 Anemia in chronic kidney disease: Secondary | ICD-10-CM | POA: Diagnosis present

## 2017-06-11 DIAGNOSIS — E1152 Type 2 diabetes mellitus with diabetic peripheral angiopathy with gangrene: Secondary | ICD-10-CM | POA: Diagnosis present

## 2017-06-11 DIAGNOSIS — E43 Unspecified severe protein-calorie malnutrition: Secondary | ICD-10-CM | POA: Diagnosis present

## 2017-06-11 DIAGNOSIS — L02214 Cutaneous abscess of groin: Secondary | ICD-10-CM | POA: Diagnosis not present

## 2017-06-11 DIAGNOSIS — D638 Anemia in other chronic diseases classified elsewhere: Secondary | ICD-10-CM | POA: Diagnosis not present

## 2017-06-11 DIAGNOSIS — E1122 Type 2 diabetes mellitus with diabetic chronic kidney disease: Secondary | ICD-10-CM | POA: Diagnosis present

## 2017-06-11 DIAGNOSIS — Z79899 Other long term (current) drug therapy: Secondary | ICD-10-CM

## 2017-06-11 DIAGNOSIS — E11319 Type 2 diabetes mellitus with unspecified diabetic retinopathy without macular edema: Secondary | ICD-10-CM | POA: Diagnosis present

## 2017-06-11 DIAGNOSIS — E1365 Other specified diabetes mellitus with hyperglycemia: Secondary | ICD-10-CM

## 2017-06-11 DIAGNOSIS — N2581 Secondary hyperparathyroidism of renal origin: Secondary | ICD-10-CM | POA: Diagnosis present

## 2017-06-11 DIAGNOSIS — I132 Hypertensive heart and chronic kidney disease with heart failure and with stage 5 chronic kidney disease, or end stage renal disease: Secondary | ICD-10-CM | POA: Diagnosis present

## 2017-06-11 DIAGNOSIS — T8189XA Other complications of procedures, not elsewhere classified, initial encounter: Secondary | ICD-10-CM | POA: Diagnosis present

## 2017-06-11 DIAGNOSIS — N189 Chronic kidney disease, unspecified: Secondary | ICD-10-CM | POA: Diagnosis not present

## 2017-06-11 DIAGNOSIS — E119 Type 2 diabetes mellitus without complications: Secondary | ICD-10-CM | POA: Diagnosis not present

## 2017-06-11 DIAGNOSIS — Y92122 Bedroom in nursing home as the place of occurrence of the external cause: Secondary | ICD-10-CM | POA: Diagnosis not present

## 2017-06-11 DIAGNOSIS — R21 Rash and other nonspecific skin eruption: Secondary | ICD-10-CM | POA: Diagnosis not present

## 2017-06-11 DIAGNOSIS — R011 Cardiac murmur, unspecified: Secondary | ICD-10-CM | POA: Diagnosis present

## 2017-06-11 DIAGNOSIS — Z881 Allergy status to other antibiotic agents status: Secondary | ICD-10-CM | POA: Diagnosis not present

## 2017-06-11 DIAGNOSIS — I10 Essential (primary) hypertension: Secondary | ICD-10-CM | POA: Diagnosis present

## 2017-06-11 DIAGNOSIS — I272 Pulmonary hypertension, unspecified: Secondary | ICD-10-CM | POA: Diagnosis present

## 2017-06-11 DIAGNOSIS — Z8349 Family history of other endocrine, nutritional and metabolic diseases: Secondary | ICD-10-CM

## 2017-06-11 DIAGNOSIS — I425 Other restrictive cardiomyopathy: Secondary | ICD-10-CM | POA: Diagnosis present

## 2017-06-11 DIAGNOSIS — Z833 Family history of diabetes mellitus: Secondary | ICD-10-CM

## 2017-06-11 DIAGNOSIS — G936 Cerebral edema: Secondary | ICD-10-CM | POA: Diagnosis not present

## 2017-06-11 DIAGNOSIS — R188 Other ascites: Secondary | ICD-10-CM | POA: Diagnosis not present

## 2017-06-11 DIAGNOSIS — W06XXXA Fall from bed, initial encounter: Secondary | ICD-10-CM | POA: Diagnosis present

## 2017-06-11 DIAGNOSIS — G4700 Insomnia, unspecified: Secondary | ICD-10-CM | POA: Diagnosis present

## 2017-06-11 DIAGNOSIS — IMO0002 Reserved for concepts with insufficient information to code with codable children: Secondary | ICD-10-CM

## 2017-06-11 DIAGNOSIS — G4489 Other headache syndrome: Secondary | ICD-10-CM | POA: Diagnosis not present

## 2017-06-11 DIAGNOSIS — Z87891 Personal history of nicotine dependence: Secondary | ICD-10-CM

## 2017-06-11 DIAGNOSIS — E785 Hyperlipidemia, unspecified: Secondary | ICD-10-CM | POA: Diagnosis present

## 2017-06-11 DIAGNOSIS — M8628 Subacute osteomyelitis, other site: Secondary | ICD-10-CM | POA: Diagnosis not present

## 2017-06-11 DIAGNOSIS — Z8249 Family history of ischemic heart disease and other diseases of the circulatory system: Secondary | ICD-10-CM | POA: Diagnosis not present

## 2017-06-11 DIAGNOSIS — G894 Chronic pain syndrome: Secondary | ICD-10-CM | POA: Diagnosis not present

## 2017-06-11 DIAGNOSIS — S79911A Unspecified injury of right hip, initial encounter: Secondary | ICD-10-CM | POA: Diagnosis not present

## 2017-06-11 DIAGNOSIS — W19XXXA Unspecified fall, initial encounter: Secondary | ICD-10-CM | POA: Diagnosis present

## 2017-06-11 DIAGNOSIS — I70261 Atherosclerosis of native arteries of extremities with gangrene, right leg: Secondary | ICD-10-CM | POA: Diagnosis not present

## 2017-06-11 DIAGNOSIS — Z89431 Acquired absence of right foot: Secondary | ICD-10-CM

## 2017-06-11 DIAGNOSIS — S8991XA Unspecified injury of right lower leg, initial encounter: Secondary | ICD-10-CM | POA: Diagnosis not present

## 2017-06-11 DIAGNOSIS — Z9841 Cataract extraction status, right eye: Secondary | ICD-10-CM

## 2017-06-11 DIAGNOSIS — T148XXD Other injury of unspecified body region, subsequent encounter: Secondary | ICD-10-CM | POA: Diagnosis present

## 2017-06-11 DIAGNOSIS — Z978 Presence of other specified devices: Secondary | ICD-10-CM | POA: Diagnosis not present

## 2017-06-11 DIAGNOSIS — G939 Disorder of brain, unspecified: Secondary | ICD-10-CM | POA: Diagnosis present

## 2017-06-11 DIAGNOSIS — K7011 Alcoholic hepatitis with ascites: Secondary | ICD-10-CM | POA: Diagnosis not present

## 2017-06-11 DIAGNOSIS — I252 Old myocardial infarction: Secondary | ICD-10-CM

## 2017-06-11 DIAGNOSIS — T8781 Dehiscence of amputation stump: Secondary | ICD-10-CM | POA: Diagnosis not present

## 2017-06-11 DIAGNOSIS — S3993XA Unspecified injury of pelvis, initial encounter: Secondary | ICD-10-CM | POA: Diagnosis not present

## 2017-06-11 DIAGNOSIS — I509 Heart failure, unspecified: Secondary | ICD-10-CM | POA: Diagnosis not present

## 2017-06-11 DIAGNOSIS — E162 Hypoglycemia, unspecified: Secondary | ICD-10-CM | POA: Diagnosis not present

## 2017-06-11 DIAGNOSIS — L8990 Pressure ulcer of unspecified site, unspecified stage: Secondary | ICD-10-CM | POA: Diagnosis not present

## 2017-06-11 DIAGNOSIS — Z681 Body mass index (BMI) 19 or less, adult: Secondary | ICD-10-CM | POA: Diagnosis not present

## 2017-06-11 DIAGNOSIS — M8668 Other chronic osteomyelitis, other site: Secondary | ICD-10-CM | POA: Diagnosis not present

## 2017-06-11 DIAGNOSIS — Z7682 Awaiting organ transplant status: Secondary | ICD-10-CM | POA: Diagnosis not present

## 2017-06-11 DIAGNOSIS — L89890 Pressure ulcer of other site, unstageable: Secondary | ICD-10-CM | POA: Diagnosis not present

## 2017-06-11 DIAGNOSIS — E1169 Type 2 diabetes mellitus with other specified complication: Secondary | ICD-10-CM | POA: Diagnosis present

## 2017-06-11 DIAGNOSIS — N186 End stage renal disease: Secondary | ICD-10-CM | POA: Diagnosis not present

## 2017-06-11 DIAGNOSIS — T8753 Necrosis of amputation stump, right lower extremity: Secondary | ICD-10-CM | POA: Diagnosis present

## 2017-06-11 DIAGNOSIS — N390 Urinary tract infection, site not specified: Secondary | ICD-10-CM | POA: Diagnosis not present

## 2017-06-11 DIAGNOSIS — G546 Phantom limb syndrome with pain: Secondary | ICD-10-CM | POA: Diagnosis not present

## 2017-06-11 DIAGNOSIS — M86271 Subacute osteomyelitis, right ankle and foot: Secondary | ICD-10-CM | POA: Diagnosis not present

## 2017-06-11 DIAGNOSIS — L97419 Non-pressure chronic ulcer of right heel and midfoot with unspecified severity: Secondary | ICD-10-CM | POA: Diagnosis not present

## 2017-06-11 DIAGNOSIS — S88111A Complete traumatic amputation at level between knee and ankle, right lower leg, initial encounter: Secondary | ICD-10-CM | POA: Diagnosis not present

## 2017-06-11 DIAGNOSIS — S2249XD Multiple fractures of ribs, unspecified side, subsequent encounter for fracture with routine healing: Secondary | ICD-10-CM | POA: Diagnosis not present

## 2017-06-11 DIAGNOSIS — S31109D Unspecified open wound of abdominal wall, unspecified quadrant without penetration into peritoneal cavity, subsequent encounter: Secondary | ICD-10-CM | POA: Diagnosis not present

## 2017-06-11 DIAGNOSIS — Z89512 Acquired absence of left leg below knee: Secondary | ICD-10-CM | POA: Diagnosis not present

## 2017-06-11 DIAGNOSIS — Z882 Allergy status to sulfonamides status: Secondary | ICD-10-CM | POA: Diagnosis not present

## 2017-06-11 DIAGNOSIS — I169 Hypertensive crisis, unspecified: Secondary | ICD-10-CM | POA: Diagnosis not present

## 2017-06-11 DIAGNOSIS — Z7401 Bed confinement status: Secondary | ICD-10-CM

## 2017-06-11 DIAGNOSIS — Z823 Family history of stroke: Secondary | ICD-10-CM | POA: Diagnosis not present

## 2017-06-11 DIAGNOSIS — Z794 Long term (current) use of insulin: Secondary | ICD-10-CM

## 2017-06-11 DIAGNOSIS — I5033 Acute on chronic diastolic (congestive) heart failure: Secondary | ICD-10-CM | POA: Diagnosis not present

## 2017-06-11 DIAGNOSIS — D72829 Elevated white blood cell count, unspecified: Secondary | ICD-10-CM | POA: Diagnosis not present

## 2017-06-11 DIAGNOSIS — M8610 Other acute osteomyelitis, unspecified site: Secondary | ICD-10-CM | POA: Diagnosis not present

## 2017-06-11 DIAGNOSIS — L299 Pruritus, unspecified: Secondary | ICD-10-CM | POA: Diagnosis not present

## 2017-06-11 DIAGNOSIS — S0990XA Unspecified injury of head, initial encounter: Secondary | ICD-10-CM | POA: Diagnosis not present

## 2017-06-11 DIAGNOSIS — X58XXXA Exposure to other specified factors, initial encounter: Secondary | ICD-10-CM | POA: Diagnosis present

## 2017-06-11 DIAGNOSIS — T8789 Other complications of amputation stump: Secondary | ICD-10-CM | POA: Diagnosis not present

## 2017-06-11 DIAGNOSIS — Z9181 History of falling: Secondary | ICD-10-CM | POA: Diagnosis not present

## 2017-06-11 DIAGNOSIS — Z01818 Encounter for other preprocedural examination: Secondary | ICD-10-CM | POA: Diagnosis not present

## 2017-06-11 DIAGNOSIS — L089 Local infection of the skin and subcutaneous tissue, unspecified: Secondary | ICD-10-CM | POA: Diagnosis not present

## 2017-06-11 DIAGNOSIS — E11610 Type 2 diabetes mellitus with diabetic neuropathic arthropathy: Secondary | ICD-10-CM | POA: Diagnosis present

## 2017-06-11 DIAGNOSIS — I120 Hypertensive chronic kidney disease with stage 5 chronic kidney disease or end stage renal disease: Secondary | ICD-10-CM | POA: Diagnosis not present

## 2017-06-11 DIAGNOSIS — Z4781 Encounter for orthopedic aftercare following surgical amputation: Secondary | ICD-10-CM | POA: Diagnosis not present

## 2017-06-11 DIAGNOSIS — L89150 Pressure ulcer of sacral region, unstageable: Secondary | ICD-10-CM | POA: Diagnosis not present

## 2017-06-11 DIAGNOSIS — M6281 Muscle weakness (generalized): Secondary | ICD-10-CM | POA: Diagnosis not present

## 2017-06-11 LAB — BASIC METABOLIC PANEL
ANION GAP: 15 (ref 5–15)
BUN: 29 mg/dL — ABNORMAL HIGH (ref 6–20)
CHLORIDE: 96 mmol/L — AB (ref 101–111)
CO2: 27 mmol/L (ref 22–32)
Calcium: 8.5 mg/dL — ABNORMAL LOW (ref 8.9–10.3)
Creatinine, Ser: 3.77 mg/dL — ABNORMAL HIGH (ref 0.44–1.00)
GFR, EST AFRICAN AMERICAN: 16 mL/min — AB (ref >60)
GFR, EST NON AFRICAN AMERICAN: 14 mL/min — AB (ref >60)
Glucose, Bld: 132 mg/dL — ABNORMAL HIGH (ref 65–99)
POTASSIUM: 3.9 mmol/L (ref 3.5–5.1)
SODIUM: 138 mmol/L (ref 135–145)

## 2017-06-11 LAB — PROTIME-INR
INR: 1.2
PROTHROMBIN TIME: 15.1 s (ref 11.4–15.2)

## 2017-06-11 LAB — CBC
HCT: 29.7 % — ABNORMAL LOW (ref 36.0–46.0)
HEMOGLOBIN: 8.9 g/dL — AB (ref 12.0–15.0)
MCH: 27.3 pg (ref 26.0–34.0)
MCHC: 30 g/dL (ref 30.0–36.0)
MCV: 91.1 fL (ref 78.0–100.0)
PLATELETS: 281 10*3/uL (ref 150–400)
RBC: 3.26 MIL/uL — AB (ref 3.87–5.11)
RDW: 16.5 % — ABNORMAL HIGH (ref 11.5–15.5)
WBC: 9.3 10*3/uL (ref 4.0–10.5)

## 2017-06-11 LAB — APTT: aPTT: 44 seconds — ABNORMAL HIGH (ref 24–36)

## 2017-06-11 MED ORDER — METOCLOPRAMIDE HCL 10 MG PO TABS
10.0000 mg | ORAL_TABLET | Freq: Three times a day (TID) | ORAL | Status: DC
  Administered 2017-06-11 – 2017-06-12 (×4): 10 mg via ORAL
  Filled 2017-06-11 (×4): qty 1

## 2017-06-11 MED ORDER — SEVELAMER CARBONATE 800 MG PO TABS
2400.0000 mg | ORAL_TABLET | Freq: Three times a day (TID) | ORAL | Status: DC
  Administered 2017-06-12 – 2017-06-23 (×21): 2400 mg via ORAL
  Filled 2017-06-11 (×26): qty 3

## 2017-06-11 MED ORDER — LORAZEPAM 1 MG PO TABS
1.0000 mg | ORAL_TABLET | Freq: Four times a day (QID) | ORAL | Status: DC | PRN
Start: 2017-06-11 — End: 2017-06-23
  Administered 2017-06-12 – 2017-06-23 (×20): 1 mg via ORAL
  Filled 2017-06-11 (×19): qty 1

## 2017-06-11 MED ORDER — FENTANYL 25 MCG/HR TD PT72
25.0000 ug | MEDICATED_PATCH | TRANSDERMAL | Status: DC
  Administered 2017-06-11 – 2017-06-20 (×4): 25 ug via TRANSDERMAL
  Filled 2017-06-11 (×5): qty 1

## 2017-06-11 MED ORDER — HYDRALAZINE HCL 100 MG PO TABS: 100.0000 mg | ORAL_TABLET | Freq: Three times a day (TID) | ORAL | Status: DC

## 2017-06-11 MED ORDER — PRO-STAT SUGAR FREE PO LIQD
30.0000 mL | Freq: Two times a day (BID) | ORAL | Status: DC
  Administered 2017-06-12 – 2017-06-18 (×3): 30 mL via ORAL
  Filled 2017-06-11 (×13): qty 30

## 2017-06-11 MED ORDER — HYDRALAZINE HCL 50 MG PO TABS
100.0000 mg | ORAL_TABLET | Freq: Three times a day (TID) | ORAL | Status: DC
  Administered 2017-06-12 (×2): 100 mg via ORAL
  Filled 2017-06-11 (×3): qty 2

## 2017-06-11 MED ORDER — OXYCODONE HCL 5 MG PO TABS
5.0000 mg | ORAL_TABLET | Freq: Four times a day (QID) | ORAL | Status: DC | PRN
  Administered 2017-06-11 – 2017-06-13 (×5): 5 mg via ORAL
  Filled 2017-06-11 (×5): qty 1

## 2017-06-11 MED ORDER — INSULIN GLARGINE 100 UNIT/ML SOLOSTAR PEN: 20.0000 [IU] | PEN_INJECTOR | SUBCUTANEOUS | Status: DC

## 2017-06-11 MED ORDER — ISOSORBIDE DINITRATE 10 MG PO TABS
60.0000 mg | ORAL_TABLET | Freq: Two times a day (BID) | ORAL | Status: DC
  Administered 2017-06-12 – 2017-06-23 (×23): 60 mg via ORAL
  Filled 2017-06-11: qty 6
  Filled 2017-06-11: qty 2
  Filled 2017-06-11 (×8): qty 6
  Filled 2017-06-11: qty 2
  Filled 2017-06-11 (×6): qty 6
  Filled 2017-06-11: qty 2
  Filled 2017-06-11 (×2): qty 6
  Filled 2017-06-11: qty 2
  Filled 2017-06-11 (×5): qty 6

## 2017-06-11 MED ORDER — FENTANYL CITRATE (PF) 100 MCG/2ML IJ SOLN
25.0000 ug | Freq: Once | INTRAMUSCULAR | Status: AC
  Administered 2017-06-11: 25 ug via INTRAVENOUS
  Filled 2017-06-11: qty 2

## 2017-06-11 MED ORDER — INSULIN ASPART 100 UNIT/ML ~~LOC~~ SOLN
0.0000 [IU] | Freq: Three times a day (TID) | SUBCUTANEOUS | Status: DC
  Administered 2017-06-15: 5 [IU] via SUBCUTANEOUS
  Administered 2017-06-15: 2 [IU] via SUBCUTANEOUS
  Administered 2017-06-15: 7 [IU] via SUBCUTANEOUS
  Administered 2017-06-16 (×2): 2 [IU] via SUBCUTANEOUS
  Administered 2017-06-17: 3 [IU] via SUBCUTANEOUS
  Administered 2017-06-19 (×2): 2 [IU] via SUBCUTANEOUS
  Administered 2017-06-19: 1 [IU] via SUBCUTANEOUS
  Administered 2017-06-21 – 2017-06-22 (×4): 2 [IU] via SUBCUTANEOUS
  Administered 2017-06-22: 3 [IU] via SUBCUTANEOUS
  Administered 2017-06-22: 2 [IU] via SUBCUTANEOUS

## 2017-06-11 MED ORDER — INSULIN GLARGINE 100 UNIT/ML SOLOSTAR PEN: 14.0000 [IU] | PEN_INJECTOR | Freq: Every day | SUBCUTANEOUS | Status: DC

## 2017-06-11 MED ORDER — INSULIN GLARGINE 100 UNIT/ML ~~LOC~~ SOLN
14.0000 [IU] | Freq: Every day | SUBCUTANEOUS | Status: DC
  Administered 2017-06-13 – 2017-06-22 (×7): 14 [IU] via SUBCUTANEOUS
  Filled 2017-06-11 (×13): qty 0.14

## 2017-06-11 MED ORDER — SERTRALINE HCL 50 MG PO TABS
50.0000 mg | ORAL_TABLET | Freq: Every day | ORAL | Status: DC
Start: 2017-06-12 — End: 2017-06-23
  Administered 2017-06-12 – 2017-06-23 (×12): 50 mg via ORAL
  Filled 2017-06-11 (×12): qty 1

## 2017-06-11 MED ORDER — RENA-VITE PO TABS
1.0000 | ORAL_TABLET | Freq: Every day | ORAL | Status: DC
  Administered 2017-06-11 – 2017-06-22 (×12): 1 via ORAL
  Filled 2017-06-11 (×13): qty 1

## 2017-06-11 MED ORDER — AMLODIPINE BESYLATE 10 MG PO TABS
10.0000 mg | ORAL_TABLET | Freq: Two times a day (BID) | ORAL | Status: DC
  Administered 2017-06-11 – 2017-06-13 (×5): 10 mg via ORAL
  Filled 2017-06-11 (×5): qty 1

## 2017-06-11 MED ORDER — DOCUSATE SODIUM 50 MG/5ML PO LIQD
100.0000 mg | Freq: Three times a day (TID) | ORAL | Status: DC
  Administered 2017-06-12 – 2017-06-23 (×20): 100 mg via ORAL
  Filled 2017-06-11 (×38): qty 10

## 2017-06-11 MED ORDER — ACETAMINOPHEN 325 MG PO TABS
650.0000 mg | ORAL_TABLET | Freq: Four times a day (QID) | ORAL | Status: DC | PRN
  Administered 2017-06-13 – 2017-06-23 (×10): 650 mg via ORAL
  Filled 2017-06-11 (×8): qty 2

## 2017-06-11 MED ORDER — ATORVASTATIN CALCIUM 40 MG PO TABS
40.0000 mg | ORAL_TABLET | Freq: Every day | ORAL | Status: DC
  Administered 2017-06-12 – 2017-06-22 (×11): 40 mg via ORAL
  Filled 2017-06-11 (×12): qty 1

## 2017-06-11 MED ORDER — ACETAMINOPHEN 650 MG RE SUPP: 650.0000 mg | Freq: Four times a day (QID) | RECTAL | Status: DC | PRN

## 2017-06-11 MED ORDER — HYDRALAZINE HCL 25 MG PO TABS
100.0000 mg | ORAL_TABLET | Freq: Once | ORAL | Status: AC
Start: 2017-06-11 — End: 2017-06-11
  Administered 2017-06-11: 100 mg via ORAL
  Filled 2017-06-11: qty 4

## 2017-06-11 MED ORDER — CLONIDINE HCL 0.3 MG PO TABS
0.3000 mg | ORAL_TABLET | Freq: Three times a day (TID) | ORAL | Status: DC
  Administered 2017-06-11 – 2017-06-12 (×2): 0.3 mg via ORAL
  Filled 2017-06-11 (×3): qty 1

## 2017-06-11 MED ORDER — PANTOPRAZOLE SODIUM 40 MG PO TBEC
40.0000 mg | DELAYED_RELEASE_TABLET | Freq: Every day | ORAL | Status: DC
  Administered 2017-06-12 – 2017-06-23 (×12): 40 mg via ORAL
  Filled 2017-06-11 (×12): qty 1

## 2017-06-11 MED ORDER — ONDANSETRON HCL 4 MG/2ML IJ SOLN: 4.0000 mg | Freq: Four times a day (QID) | INTRAMUSCULAR | Status: DC | PRN

## 2017-06-11 MED ORDER — SPIRONOLACTONE 50 MG PO TABS
100.0000 mg | ORAL_TABLET | Freq: Every day | ORAL | Status: DC
  Administered 2017-06-12: 100 mg via ORAL
  Filled 2017-06-11: qty 4

## 2017-06-11 MED ORDER — INSULIN GLARGINE 100 UNIT/ML ~~LOC~~ SOLN
20.0000 [IU] | Freq: Every day | SUBCUTANEOUS | Status: DC
  Administered 2017-06-12 – 2017-06-23 (×10): 20 [IU] via SUBCUTANEOUS
  Filled 2017-06-11 (×12): qty 0.2

## 2017-06-11 MED ORDER — ONDANSETRON HCL 4 MG PO TABS: 4.0000 mg | ORAL_TABLET | Freq: Four times a day (QID) | ORAL | Status: DC | PRN

## 2017-06-11 MED ORDER — LABETALOL HCL 300 MG PO TABS
300.0000 mg | ORAL_TABLET | Freq: Three times a day (TID) | ORAL | Status: DC
  Administered 2017-06-11 – 2017-06-23 (×28): 300 mg via ORAL
  Filled 2017-06-11 (×29): qty 1

## 2017-06-11 NOTE — Assessment & Plan Note (Signed)
It is most important to avoid significant hypoglycemia which would mimic TIAs in this debilitated patient Discordance between glucoses and A1c would be expected due to end-stage renal disease

## 2017-06-11 NOTE — ED Notes (Signed)
Iv access attempted by 2 rns, unsuccessful. IV team consulted

## 2017-06-11 NOTE — H&P (Signed)
History and Physical    [name] QAS:[id number] DOB: Mar 31, 1976 DOA: 06/11/2017  PCP: Merrilee Seashore, MD  Patient coming from: Skilled nursing facility.  Chief Complaint: Fall.  HPI: [name] is a 42 y.o. female with history of ESRD on hemodialysis Monday Wednesday Friday recent cardiac arrest in April 16, 2017 and eventually had ventilator dependent respiratory failure and was discharged to long-term acute care and was recently discharged and tracheostomy removed was brought to the ER after patient had a fall at her living facility.  Patient states he fell off the bed.  Did hit her head but did not lose consciousness.  Patient also has sacral diabetes also which has been worsening in pain.  Denies any chest pain shortness of breath nausea vomiting or diarrhea.  Since the fall patient has been having a headache.  ED Course: In the ER CT of the head done shows 8 mm hyperdense concerning for hemorrhagic mass in the left parietal area.  On-call neurosurgeon Dr. Christella Noa was consulted and requested MRI brain.  X-ray of the pelvis was showing possibility of pelvic fracture of the skin fold.  CT pelvis was ordered which shows no acute fracture but does show worsening sacral tachycardia but also with osteomyelitis.  On exam patient also has significantly increased ascites and will need paracentesis in the morning.  Patient unable to lie flat because of the ascites and sacral decubitus ulcers and because of which MRI was unable to be done.  Review of Systems: As per HPI, rest all negative.   Past Medical History:  Diagnosis Date  . Arthritis   . CHF (congestive heart failure) ([place])   . Chronic anemia    2nd to renal disease  . CIN III (cervical intraepithelial neoplasia grade III) with severe dysplasia    S/P LEEP AND CONE  . Coronary artery disease    Status post cardiac catheterization June 2012 scattered coronary artery disease/atherosclerosis with 70-80% stenosis in a  small right PDA.  . Diabetic Charcot's joint disease ([place])   . DM (diabetes mellitus) ([place])    Long-term insulin  . End stage renal disease on dialysis [facility]) 05/02/11   "[facility]"; [facility]; M; W, F ("got it 04/15/2017 because of Thanksgiving")  . Fracture of 5th metatarsal 2016   Right  . Gastroparesis   . Heart murmur   . 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 History of abscesses in groins 12/06/2010  . Hyperlipidemia    Hypertriglyceridemia 449 HDL 25  . Hypertension   . Migraines    "just on dialysis days"  . Peripheral neuropathy    related to DM  . Peripheral vascular disease ([place])    Tibial occlusive disease evaluated by Dr. Kellie Simmering in August 2011. Medical therapy  . Renal insufficiency    Dialysis since 2012  . Seizures ([place])    "only when her sugar drops" (04/17/2017)  . Tobacco use disorder    Discontinued March 2012  . Trimalleolar fracture of ankle, closed 02/09/2015   Right    Past Surgical History:  Procedure Laterality Date  . ABDOMINAL AORTOGRAM W/LOWER EXTREMITY [name] 03/27/2017   Procedure: ABDOMINAL AORTOGRAM W/LOWER EXTREMITY;  Surgeon: Conrad Lynnwood, MD;  Location: [facility];  Service: Cardiovascular;  Laterality: [name];  . AMPUTATION Right 05/09/2017   Procedure: AMPUTATION TRANSMETATARSAL OF RIGHT FOOT;  Surgeon: Serafina Mitchell, MD;  Location: [place];  Service: Vascular;  Laterality: Right;  . AV FISTULA PLACEMENT Left 04/2010   "lower  arm"  . CARDIAC CATHETERIZATION [name] 02/10/2015   Procedure: Left Heart Cath and Coronary Angiography;  Surgeon: Dixie Dials, MD;  Location: [place];  Service: Cardiovascular;  Laterality: [name];  . CARDIAC CATHETERIZATION [name] 02/21/2016   Procedure: Right Heart Cath;  Surgeon: Jolaine Artist, MD;  Location: [place] [facility];  Service: Cardiovascular;  Laterality: [name];  . CATARACT EXTRACTION W/ INTRAOCULAR LENS  IMPLANT, BILATERAL Bilateral   . CERVICAL BIOPSY  W/ LOOP ELECTRODE EXCISION     h/o  . CERVICAL CONE  BIOPSY     h/o  . COLPOSCOPY    . DILATION AND CURETTAGE OF UTERUS  2009  . ENDARTERECTOMY FEMORAL Right 04/16/2017   Procedure: RIGHT FEMORAL ENDARTERECTOMY;  Surgeon: Serafina Mitchell, MD;  Location: [place];  Service: Vascular;  Laterality: Right;  . FRACTURE SURGERY    . HERNIA REPAIR    . INCISION AND DRAINAGE ABSCESS     "groin"  . INSERTION OF DIALYSIS CATHETER Left 04/16/2017   Procedure: INSERTION of temporay DIALYSIS CATHETER, left femoral artery;  Surgeon: Serafina Mitchell, MD;  Location: [facility];  Service: Vascular;  Laterality: Left;  . IR HYBRID TRAUMA EMBOLIZATION  04/16/2017  . ORIF ANKLE FRACTURE Right 02/16/2015   Procedure: OPEN REDUCTION INTERNAL FIXATION (ORIF) RIGHT ANKLE FRACTURE;  Surgeon: Altamese Bushnell, MD;  Location: [place];  Service: Orthopedics;  Laterality: Right;  . PATCH ANGIOPLASTY Right 04/16/2017   Procedure: PATCH ANGIOPLASTY OF RIGHT FEMORAL ARTERY USING BOVINE PERICARDIUM PATCH;  Surgeon: Serafina Mitchell, MD;  [facility];  Service: Vascular;  Laterality: Right;  . UMBILICAL HERNIA REPAIR  X 2  . WOUND EXPLORATION Right 05/09/2017   Procedure: [place];  Surgeon: Serafina Mitchell, MD;  Location: [facility];  Service: Vascular;  Laterality: Right;     reports that she quit smoking about 5 years ago. Her smoking use included cigarettes. She has a 3.00 pack-year smoking history. she has never used smokeless tobacco. She reports that she does not drink alcohol or use drugs.  Allergies  Allergen Reactions  . Cephalexin Other (See Comments)    Reaction unknown- Childhood allergy Tolerated Ceftriaxone in the past  . Sulfamethoxazole-Trimethoprim Other (See Comments)    Unknown reaction. Pt states that she was told by her mother that she had allergy to Bactrim as a child.    Family History  Problem Relation Age of Onset  . Diabetes Mother   . Hypertension Mother   . Diabetes Father   . Hyperlipidemia Father   . Hypertension Father   .  Diabetes Sister   . Stroke Maternal Grandmother   . Diabetes Sister   . Breast cancer Maternal Aunt        Age 72's    Prior to Admission medications   Medication Sig Start Date End Date Taking? Authorizing Provider  Amino Acids-Protein Hydrolys (FEEDING SUPPLEMENT, PRO-STAT SUGAR FREE 64,) LIQD Take 30 mLs by mouth 2 (two) times daily.   Yes [provider]  amLODipine (NORVASC) 10 MG tablet Take 10 mg by mouth 2 (two) times daily.    Yes [provider]  aspirin EC 81 MG EC tablet Take 1 tablet (81 mg total) by mouth daily. 03/30/17  Yes Alma Friendly, MD  atorvastatin (LIPITOR) 40 MG tablet Take 40 mg by mouth daily at 6 PM.    Yes [provider]  b complex-vitamin c-folic acid (NEPHRO-VITE) 0.8 MG TABS tablet Take 1 tablet by mouth  daily.   Yes [provider]  cloNIDine (CATAPRES) 0.3 MG tablet Take 0.3 mg by mouth 3 (three) times daily. 03/14/16  Yes [provider]  docusate (COLACE) 50 MG/5ML liquid Take 100 mg by mouth 3 (three) times daily.   Yes [provider]  fentaNYL (DURAGESIC - DOSED MCG/HR) 25 MCG/HR patch Place 25 mcg onto the skin every 3 (three) days.   Yes [provider]  hydrALAZINE (APRESOLINE) 100 MG tablet Take 1 tablet (100 mg total) by mouth every 8 (eight) hours. 02/24/16  Yes Tat, Shanon Brow, MD  Insulin Glargine (LANTUS SOLOSTAR) 100 UNIT/ML Solostar Pen Inject 14 Units into the skin daily at 10 pm.    Yes [provider]  Insulin Glargine (LANTUS SOLOSTAR) 100 UNIT/ML Solostar Pen Inject 20 Units into the skin every morning.   Yes [provider]  isosorbide dinitrate (ISORDIL) 30 MG tablet Take 60 mg by mouth 2 (two) times daily. Give two 30 mg tablets to = 60 mg BID   Yes [provider]  labetalol (NORMODYNE) 300 MG tablet Take 1 tablet (300 mg total) by mouth 3 (three) times daily. 11/01/14  Yes Debbe Odea, MD  LORazepam (ATIVAN) 1 MG tablet Take 1 mg by mouth every 6  (six) hours as needed for anxiety.   Yes [provider]  metoCLOPramide (REGLAN) 10 MG tablet Take 10 mg by mouth 3 (three) times daily.   Yes [provider]  ondansetron (ZOFRAN) 4 MG tablet Take 4 mg by mouth every 6 (six) hours as needed for nausea or vomiting.   Yes [provider]  oxyCODONE (OXY IR/ROXICODONE) 5 MG immediate release tablet Take 5 mg by mouth every 6 (six) hours as needed for severe pain.   Yes [provider]  pantoprazole (PROTONIX) 40 MG tablet Take 40 mg by mouth daily.   Yes [provider]  sertraline (ZOLOFT) 50 MG tablet Take 50 mg by mouth daily.   Yes [provider]  sevelamer carbonate (RENVELA) 800 MG tablet Take 2,400 mg by mouth 3 (three) times daily with meals. Give 3 tablets to = 2400 mg TID   Yes [provider]  spironolactone (ALDACTONE) 100 MG tablet Take 100 mg daily by mouth. 02/19/17  Yes [provider]  Darbepoetin Alfa (ARANESP, ALBUMIN FREE,) 200 MCG/ML SOLN Inject 200 mg as directed. Give at dialysis on Wednesday    [provider]  multivitamin (RENA-VIT) TABS tablet Take 1 tablet by mouth at bedtime. 03/29/17   Alma Friendly, MD    Physical Exam: Vitals:   06/11/17 2045 06/11/17 2100 06/11/17 2114 06/11/17 2130  BP:  (!) 179/66 (!) 179/66 (!) 184/71  Pulse: 80     Resp: 18     Temp:      TempSrc:      SpO2: 98%     Weight:      Height:          Constitutional: Moderately built and nourished. Vitals:   06/11/17 2045 06/11/17 2100 06/11/17 2114 06/11/17 2130  BP:  (!) 179/66 (!) 179/66 (!) 184/71  Pulse: 80     Resp: 18     Temp:      TempSrc:      SpO2: 98%     Weight:      Height:       Eyes: Anicteric no pallor. ENMT: No discharge from the ears eyes nose or mouth. Neck: No mass felt.  No neck rigidity. Respiratory:  No rhonchi or crepitations. Cardiovascular: S1-S2 heard no murmurs appreciated. Abdomen: Distended nontender bowel  sounds present. Musculoskeletal: Right foot is under dressing. Skin: Right foot under dressing.  Sacral diabetes also. Neurologic: Alert awake oriented to time place and person.  Moves all extremities. Psychiatric: Appears normal.  Normal affect.   Labs on Admission: I have personally reviewed following labs and imaging studies  CBC: Recent Labs  Lab 06/06/17 0711 06/09/17 0500 06/11/17 1757  WBC 11.9* 9.5 9.3  HGB 7.7* 7.5* 8.9*  HCT 26.6* 24.9* 29.7*  MCV 92.0 90.9 91.1  PLT 305 282 629   Basic Metabolic Panel: Recent Labs  Lab 06/06/17 0711 06/09/17 0500 06/11/17 1757  NA 134* 137 138  K 5.6* 5.7* 3.9  CL 97* 98* 96*  CO2 22 22 27   GLUCOSE 130* 161* 132*  BUN 56* 70* 29*  CREATININE 5.44* 6.14* 3.77*  CALCIUM 8.9 9.0 8.5*  PHOS 3.3 4.0  --    GFR: Estimated Creatinine Clearance: 25.5 mL/min (A) (by C-G formula based on SCr of 3.77 mg/dL (H)). Liver Function Tests: Recent Labs  Lab 06/06/17 0711 06/09/17 0500  ALBUMIN 1.7* 1.6*   No results for input(s): LIPASE, AMYLASE in the last 168 hours. No results for input(s): AMMONIA in the last 168 hours. Coagulation Profile: Recent Labs  Lab 06/11/17 1922  INR 1.20   Cardiac Enzymes: No results for input(s): CKTOTAL, CKMB, CKMBINDEX, TROPONINI in the last 168 hours. BNP (last 3 results) No results for input(s): PROBNP in the last 8760 hours. HbA1C: No results for input(s): HGBA1C in the last 72 hours. CBG: No results for input(s): GLUCAP in the last 168 hours. Lipid Profile: No results for input(s): CHOL, HDL, LDLCALC, TRIG, CHOLHDL, LDLDIRECT in the last 72 hours. Thyroid Function Tests: No results for input(s): TSH, T4TOTAL, FREET4, T3FREE, THYROIDAB in the last 72 hours. Anemia Panel: No results for input(s): VITAMINB12, FOLATE, FERRITIN, TIBC, IRON, RETICCTPCT in the last 72 hours. Urine analysis:    Component Value Date/Time   COLORURINE YELLOW 02/02/2017 1123   APPEARANCEUR CLOUDY (A)  02/02/2017 1123   LABSPEC 1.008 02/02/2017 1123   PHURINE 9.0 (H) 02/02/2017 1123   GLUCOSEU >=500 (A) 02/02/2017 1123   HGBUR NEGATIVE 02/02/2017 1123   BILIRUBINUR NEGATIVE 02/02/2017 1123   KETONESUR NEGATIVE 02/02/2017 1123   PROTEINUR >=300 (A) 02/02/2017 1123   UROBILINOGEN 0.2 02/08/2011 1812   NITRITE NEGATIVE 02/02/2017 1123   LEUKOCYTESUR NEGATIVE 02/02/2017 1123   Sepsis Labs: @LABRCNTIP (procalcitonin:4,lacticidven:4) )No results found for this or any previous visit (from the past 240 hour(s)).   Radiological Exams on Admission: Ct Head Wo Contrast  Result Date: 06/11/2017 CLINICAL DATA:  [place] and hit head EXAM: CT HEAD WITHOUT CONTRAST TECHNIQUE: Contiguous axial images were obtained from the base of the skull through the vertex without intravenous contrast. COMPARISON:  CT brain 02/02/2017 FINDINGS: Brain: 8 mm hyperdense focus within the left posterior parietal lobe with surrounding edema. No midline shift. The ventricles are of normal size. Vascular: No hyperdense vessels. Carotid artery calcification. Numerous small vessel calcifications at the skull base. Skull: Increased bilateral mastoid effusion.  No fracture Sinuses/Orbits: Mucosal thickening in the left maxillary and ethmoid sinuses. No acute orbital abnormality Other: None IMPRESSION: Interim development of an 8 mm hyperdense focus within the left posterior parietal region with mild surrounding edema. Appearance is suspicious for small hemorrhagic mass. MRI is suggested for further evaluation. Critical Value/emergent results were called by telephone at the time of interpretation on 06/11/2017 at 6:49  pm to Dr. Dorie Rank , who verbally acknowledged these results. Electronically Signed   By: Donavan Foil M.D.   On: 06/11/2017 18:49   Dg Knee Complete 4 Views Right  Result Date: 06/11/2017 CLINICAL DATA:  Fall with knee pain EXAM: RIGHT KNEE - COMPLETE 4+ VIEW COMPARISON:  None. FINDINGS: No fracture or malalignment is  seen. Mild patellofemoral degenerative changes. Extensive vascular calcifications. No large effusion. IMPRESSION: Minimal degenerative changes.  No acute osseous abnormality. Electronically Signed   By: Donavan Foil M.D.   On: 06/11/2017 18:56   Dg Hip Unilat W Or Wo Pelvis 2-3 Views Right  Result Date: 06/11/2017 CLINICAL DATA:  Fall with right hip and knee pain EXAM: DG HIP (WITH OR WITHOUT PELVIS) 2-3V RIGHT COMPARISON:  None. FINDINGS: No acute fracture or dislocation of the right hip. Extensive vascular calcifications. A lucency at the right inferior pubic ramus is noted. Pubic symphysis is intact. Extensive vascular calcification IMPRESSION: 1. No fracture or malalignment of the proximal right femur 2. Lucency over the right inferior pubic ramus, uncertain if this is related to skin fold artifact or a nondisplaced fracture. Electronically Signed   By: Donavan Foil M.D.   On: 06/11/2017 18:55     Assessment/Plan Active Problems:   End stage renal disease on dialysis [facility])   PAD (peripheral artery disease) (H[facility])   CAD (coronary artery disease)   Ascites   Fluid overload   Hypertension   Brain mass   Fall    1. Fall with head CT showing hemorrhagic mass -appreciate neurosurgery consult.  MRI brain to be done once patient can lie flat. 2. Ascites likely from fluid overload -will get paracentesis done in the morning.  Follow labs. 3. Sacral decubitus ulcer with CAT scan showing possibility of osteomyelitis -I have ordered blood cultures and sed rate.  Please consult infectious disease in a.m. for antibiotic. 4. ESRD on hemodialysis on Monday Wednesday and Friday -has had dialysis today.  Consult nephrology in a.m. 5. Hypertension uncontrolled -patient is on hydralazine amlodipine and clonidine labetalol spironolactone Isordil.  I have placed patient on PRN IV hydralazine. 6. Anemia likely from ESRD -follow CBC. 7. Recent cardiac arrest. 8. Peripheral vascular disease status post right  foot transmetatarsal amputation on also right femoral endarterectomy. 9. CAD -denies any chest pain.   DVT prophylaxis: SCDs since patient has hemorrhagic mass in the brain CT scan. Code Status: Full code. Family Communication: Patient's family at the bedside. Disposition Plan: Skilled nursing facility. Consults called: Neurosurgery. Admission status: Inpatient.   Rise Patience MD [facility] Pager [phone].  If 7PM-7AM, please contact night-coverage [email or web address] Password [facility]  06/11/2017, 10:22 PM

## 2017-06-11 NOTE — Patient Instructions (Addendum)
See assessment and plan under each diagnosis in the problem list and acutely for this visit To ED for assessment and imaging following a fall with head and knee trauma.

## 2017-06-11 NOTE — ED Notes (Signed)
Iv team at bedside  

## 2017-06-11 NOTE — ED Notes (Signed)
Iv attempted wihtput success x 1

## 2017-06-11 NOTE — Progress Notes (Signed)
NURSING HOME LOCATION:  [facility] ROOM NUMBER:  [id number]  CODE STATUS:  Full Code  PCP:  Merrilee Seashore, MD  [address] [place] [address]   This is a comprehensive admission note to [facility] performed on this date less than 30 days from date of admission. Included are preadmission medical/surgical history;reconciled medication list; family history; social history and comprehensive review of systems.  Corrections and additions to the records were documented . Comprehensive physical exam was also performed. Additionally a clinical summary was entered for each active diagnosis pertinent to this admission in the Problem List to enhance continuity of care.  HPI: The patient was hospitalized at [facility] 11/21-12/3/18 for peripheral vascular disease. On 11/21 right CFA, profunda, SFA, and  EIA endarterectomy with patch angioplasty were performed by Dr. Trula Slade. Before additional endovascular revascularization could be completed the patient had cardiovascular collapse. TEE probe revealed no obvious cause. Bronchoscopy was performed because of decreased oxygen saturation. Pulmonary angiography was negative. The patient had received 15,000 units of heparin to treat possible PE. Diagnostic studies were unremarkable. The patient received several hours and several rounds of CPR with improvement in hemodynamic stability. In the ICU the patient sustained another cardiac arrest and received another 30 minutes of CPR. The patient was reintubated and received pressure support. Echo was again negative. Nephrology treated the patient for ESRD with dialysis. The possibility of acute  sepsis was covered with broad-spectrum antibiotics. The patient was weaned from ventilator and tracheostomy was performed 04/22/17. Ischemic tissue changes in the right foot remained stable clinically. It was feared patient might require TMA by Dr.  Sharol Given. Additionally further endovascular  revascularization of the right lower extremity was to be considered. Postop she had persistent lymphatic drainage from the right groin.  She was discharged to [facility] 04/28/17 until she was discharged to the SNF on 06/10/17. At that facility hospital the patient was weaned to room air. Hemodialysis was continued for end-stage renal disease. Accu-Cheks and Lantus were initiated for type 1 diabetes. One episode of hypoglycemia occurred and Lantus was subsequently decreased. Initially the patient had an NG tube placed for tube feedings Wound care treated the right groin wound Pain management saw the patient for pain control related to multiple rib fractures secondary to CPR. Pain improved with fentanyl patch. Ileus present upon admission to the subspecialty hospital improved with Reglan. Transmetatarsal amputation 05/09/17 was necessary due to lack of improvement of gangrene. Also she has sacral decubitus ulcer. This was debrided. PT/OT saw the patient for generalized weakness. As of 06/11/17 she was felt to be stable enough for transfer to SNF.   Past medical and surgical history:Includes seizures in the context of hypoglycemia, migraines related to dialysis, hypertension, dyslipidemia, hemophilia A carrier state, diabetic gastroparesis, cervical dysplasia, and chronic anemia. Diabetes is uncontrolled based on hemoglobin A1c of 9.5% on 03/24/17. Random glucose yesterday was 258. The patient is on Lantus 14 units at bedtime and 20 units each morning.   She has had D&C and colposcopy as well as cervical, biopsy.  Social history:She is a former smoker, she quit in 2013. She does not drink.   Family history:Reviewed   Review of systems: she was seen today for SNF admission H&P , but she has had acute change in condition . She states that she was falling asleep in the right lateral decubitus position close to the edge of the bed and fell out of bed striking right temple on bedside  dresser.  There was no loss of consciousness. She also struck the floor with her right knee. She has persistent right temporal headache which is described as sharp up to level VIII. This is associated with some nausea. Following the fall her blood pressure has risen  as high as 190/90. She also continues to have pain in the right knee.  She denies any cardiac or neurologic prodrome prior to the fall from bed.  She describes chronic anxiety.  Constitutional: No fever,significant weight change, fatigue  Eyes: No redness, discharge, pain, vision change ENT/mouth: No nasal congestion,  purulent discharge, earache,change in hearing ,sore throat  Cardiovascular: No chest pain, palpitations,paroxysmal nocturnal dyspnea, claudication, edema  Respiratory: No cough, sputum production,hemoptysis, DOE , significant snoring,apnea  Gastrointestinal: No heartburn,dysphagia,abdominal pain, vomiting,rectal bleeding, melena,change in bowels Genitourinary: No dysuria,hematuria, pyuria,  incontinence, nocturia Dermatologic: No rash, pruritus, change in appearance of skin Neurologic: No dizziness,syncope, seizures, numbness , tingling Psychiatric: No significant depression, insomnia, anorexia Endocrine: No change in hair/skin/ nails, excessive thirst, excessive hunger, excessive urination  Hematologic/lymphatic: No significant bruising, lymphadenopathy,abnormal bleeding Allergy/immunology: No itchy/ watery eyes, significant sneezing, urticaria, angioedema  Physical exam:  Pertinent or positive findings: she appears chronically ill and almost somewhat jaundiced. There is no definite scleral icterus. There is a rough grade 1.5 systolic murmur loudest at the base. She has minor rales at the bases. Abdomen is protuberant. Pedal pulses are decreased on the left. The right foot is dressed ; pulses are not palpable. She is tender to palpation over the right patella. No definite effusion is present.  There is marked atrophy &   flaccidity of the musculature of the left upper extremity with associated weakness. R leg is weaker than the left to opposition.  There is no definite cranial nerve deficit although there is slight decrease in the right nasolabial fold. There is slight decrease in lateral gaze symmetrically. Sensation to light touch on the face appears normal but she is markedly tender over the right temple to palpation with suggestion of a boss over the right anterior temple. Visual fields and extraocular motion were normal.   General appearance: no acute distress , increased work of breathing is present.   Lymphatic: No lymphadenopathy about the head, neck, axilla . Eyes: No conjunctival inflammation or lid edema is present.  Ears:  External ear exam shows no significant lesions or deformities.   Nose:  External nasal examination shows no deformity or inflammation. Nasal mucosa are pink and moist without lesions ,exudates Oral exam: lips and gums are healthy appearing.There is no oropharyngeal erythema or exudate . Neck:  No thyromegaly, masses, tenderness noted.    Heart:  Normal rate and regular rhythm. S1 and S2 normal without gallop, click, rub .  Lungs: without wheezes, rhonchi, rubs. Abdomen:Bowel sounds are normal. Abdomen is soft and nontender with no organomegaly, hernias,masses. GU: deferred  Extremities:  No cyanosis, clubbing,edema  Neurologic exam : Balance,Rhomberg,finger to nose testing could not be completed due to clinical state Skin: Warm & dry w/o tenting. No significant rash.  See clinical summary under each active problem in the Problem List with associated updated therapeutic plan

## 2017-06-11 NOTE — ED Notes (Signed)
On way to CT 

## 2017-06-11 NOTE — Telephone Encounter (Signed)
Returned phone call to patient. Patient's husband left message regarding the need to make a follow up appointment with Dr. Trula Slade. Appointment was actually already made for 06/16/17. I gave patient the date and time of appointment.

## 2017-06-11 NOTE — ED Provider Notes (Signed)
[facility] EMERGENCY DEPARTMENT Provider Note   CSN: [id number] Arrival date & time: 06/11/17  1717     History   Chief Complaint Chief Complaint  Patient presents with  . Fall    HPI [name] is a 42 y.o. female.  HPI Patient presents to the emergency room for evaluation after a fall.  Patient has a history of multiple medical problems including diabetes, coronary artery disease, end-stage renal disease, ascites, and congestive heart failure.  Patient states she fell out of her nursing home bed today and landed on the floor.  She is not strong enough to get up on her own.  Patient is now having pain in her right hip and right knee.  She hit her head, did not lose consciousness, but does have a bad headache on the right side.  She denies any neck pain.  No back pain.  No numbness.  No new weakness. Past Medical History:  Diagnosis Date  . Arthritis   . CHF (congestive heart failure) ([place])   . Chronic anemia    2nd to renal disease  . CIN III (cervical intraepithelial neoplasia grade III) with severe dysplasia    S/P LEEP AND CONE  . Coronary artery disease    Status post cardiac catheterization June 2012 scattered coronary artery disease/atherosclerosis with 70-80% stenosis in a small right PDA.  . Diabetic Charcot's joint disease ([place])   . DM (diabetes mellitus) ([place])    Long-term insulin  . End stage renal disease on dialysis [facility]) 05/02/11   "[facility]"; [facility]; M; W, F ("got it 04/15/2017 because of Thanksgiving")  . Fracture of 5th metatarsal 2016   Right  . Gastroparesis   . Heart murmur   . 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 History of abscesses in groins 12/06/2010  . Hyperlipidemia    Hypertriglyceridemia 449 HDL 25  . Hypertension   . Migraines    "just on dialysis days"  . Peripheral neuropathy    related to DM  . Peripheral vascular disease ([place])    Tibial occlusive disease evaluated by Dr. Kellie Simmering in August 2011. Medical therapy  .  Renal insufficiency    Dialysis since 2012  . Seizures ([place])    "only when her sugar drops" (04/17/2017)  . Tobacco use disorder    Discontinued March 2012  . Trimalleolar fracture of ankle, closed 02/09/2015   Right    Patient Active Problem List   Diagnosis Date Noted  . 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 History of ETT   . Pressure injury of skin 04/22/2017  . Acute respiratory failure with hypoxia ([place])   . Cardiac arrest, cause unspecified ([place])   . Non-healing ulcer ([place])   . Hypertension 03/24/2017  . Hypertensive urgency 03/24/2017  . Cellulitis in diabetic foot ([place]) 03/24/2017  . Cellulitis 03/24/2017  . Chronic pain 03/24/2017  . Diabetic ulcer of lower leg ([place]) 03/24/2017  . Gangrene of toe of right foot ([place])   . Acute on chronic diastolic CHF (congestive heart failure) ([place]) 02/22/2016  . Fluid overload   . Pericardial effusion   . Ascites 02/15/2016  . Uncontrolled diabetes mellitus with end-stage renal disease ([place]) 02/16/2015  . Bimalleolar ankle fracture 02/12/2015  . CAD (coronary artery disease) 02/09/2015  . PAD (peripheral artery disease) ([place])   . Gastroparesis   . Chronic anemia   . Menorrhagia with regular cycle 01/04/2014  . Dysmenorrhea 01/04/2014  . Nausea &  vomiting 10/16/2011  . History of noncompliance with medical treatment 05/09/2011  . Chronic UTI 05/09/2011  . CIN III (cervical intraepithelial neoplasia grade III) with severe dysplasia   . History of abscesses in groins 12/06/2010  . End stage renal disease on dialysis ([place])   . Coronary artery disease   . Hyperlipidemia   . 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 NONDEPENDENT TOBACCO USE DISORDER 12/20/2009  . Essential hypertension, benign 12/20/2009  . NEPHROTIC SYNDROME 12/20/2009    Past Surgical History:  Procedure Laterality Date  . ABDOMINAL AORTOGRAM W/LOWER EXTREMITY [name] 03/27/2017   Procedure: ABDOMINAL AORTOGRAM W/LOWER EXTREMITY;  Surgeon:  Conrad Brookfield, MD;  Location: [place] [facility];  Service: Cardiovascular;  Laterality: [name];  . AMPUTATION Right 05/09/2017   Procedure: AMPUTATION TRANSMETATARSAL OF RIGHT FOOT;  Surgeon: Serafina Mitchell, MD;  Location: [facility];  Service: Vascular;  Laterality: Right;  . AV FISTULA PLACEMENT Left 04/2010   "lower arm"  . CARDIAC CATHETERIZATION [name] 02/10/2015   Procedure: Left Heart Cath and Coronary Angiography;  Surgeon: Dixie Dials, MD;  Location: [place] [facility];  Service: Cardiovascular;  Laterality: [name];  . CARDIAC CATHETERIZATION [name] 02/21/2016   Procedure: Right Heart Cath;  Surgeon: Jolaine Artist, MD;  Location: [place] [facility];  Service: Cardiovascular;  Laterality: [name];  . CATARACT EXTRACTION W/ INTRAOCULAR LENS  IMPLANT, BILATERAL Bilateral   . CERVICAL BIOPSY  W/ LOOP ELECTRODE EXCISION     h/o  . CERVICAL CONE BIOPSY     h/o  . COLPOSCOPY    . DILATION AND CURETTAGE OF UTERUS  2009  . ENDARTERECTOMY FEMORAL Right 04/16/2017   Procedure: RIGHT FEMORAL ENDARTERECTOMY;  Surgeon: Serafina Mitchell, MD;  Location: [place];  Service: Vascular;  Laterality: Right;  . FRACTURE SURGERY    . HERNIA REPAIR    . INCISION AND DRAINAGE ABSCESS     "groin"  . INSERTION OF DIALYSIS CATHETER Left 04/16/2017   Procedure: INSERTION of temporay DIALYSIS CATHETER, left femoral artery;  Surgeon: Serafina Mitchell, MD;  Location: [facility];  Service: Vascular;  Laterality: Left;  . IR HYBRID TRAUMA EMBOLIZATION  04/16/2017  . ORIF ANKLE FRACTURE Right 02/16/2015   Procedure: OPEN REDUCTION INTERNAL FIXATION (ORIF) RIGHT ANKLE FRACTURE;  Surgeon: Altamese Louisburg, MD;  Location: [facility];  Service: Orthopedics;  Laterality: Right;  . PATCH ANGIOPLASTY Right 04/16/2017   Procedure: PATCH ANGIOPLASTY OF RIGHT FEMORAL ARTERY USING BOVINE PERICARDIUM PATCH;  Surgeon: Serafina Mitchell, MD;  Location: [facility];  Service: Vascular;  Laterality: Right;  . UMBILICAL HERNIA REPAIR  X 2  . WOUND EXPLORATION Right  05/09/2017   Procedure: EXPLORATION RIGHT GROIN;  Surgeon: Serafina Mitchell, MD;  Location: [facility] OR;  Service: Vascular;  Laterality: Right;    OB History    Gravida Para Term Preterm AB Living   2 1 1   1 1    SAB TAB Ectopic Multiple Live Births                   Home Medications    Prior to Admission medications   Medication Sig Start Date End Date Taking? Authorizing Provider  Amino Acids-Protein Hydrolys (FEEDING SUPPLEMENT, PRO-STAT SUGAR FREE 64,) LIQD Take 30 mLs by mouth 2 (two) times daily.    [provider]  amLODipine (NORVASC) 5 MG tablet Take 5 mg by mouth daily.    [provider]  aspirin EC 81 MG EC tablet Take 1 tablet (81 mg  total) by mouth daily. 03/30/17   Alma Friendly, MD  atorvastatin (LIPITOR) 40 MG tablet Take 40 mg by mouth daily.    [provider]  Darbepoetin Alfa (ARANESP, ALBUMIN FREE,) 200 MCG/ML SOLN Inject 200 mg as directed. Give at dialysis on Wednesday    [provider]  Docusate Sodium (COLACE PO) Take 10 mLs by mouth 3 (three) times daily.    [provider]  fentaNYL (DURAGESIC - DOSED MCG/HR) 25 MCG/HR patch Place 25 mcg onto the skin every 3 (three) days.    [provider]  hydrALAZINE (APRESOLINE) 100 MG tablet Take 1 tablet (100 mg total) by mouth every 8 (eight) hours. 02/24/16   Orson Eva, MD  Insulin Glargine (LANTUS SOLOSTAR) 100 UNIT/ML Solostar Pen Inject 14 Units into the skin at bedtime.    [provider]  Insulin Glargine (LANTUS SOLOSTAR) 100 UNIT/ML Solostar Pen Inject 20 Units into the skin every morning.    [provider]  isosorbide dinitrate (ISORDIL) 30 MG tablet Take 60 mg by mouth 2 (two) times daily. Give two 30 mg tablets to = 60 mg BID    [provider]  labetalol (NORMODYNE) 300 MG tablet Take 1 tablet (300 mg total) by mouth 3 (three) times daily. 11/01/14   Debbe Odea, MD  LORazepam (ATIVAN) 1 MG tablet Take 1 mg by mouth every 6  (six) hours as needed for anxiety.    [provider]  metoCLOPramide (REGLAN) 10 MG tablet Take 10 mg by mouth 3 (three) times daily.    [provider]  multivitamin (RENA-VIT) TABS tablet Take 1 tablet by mouth at bedtime. 03/29/17   Alma Friendly, MD  ondansetron (ZOFRAN) 4 MG tablet Take 4 mg by mouth every 6 (six) hours as needed for nausea or vomiting.    [provider]  oxyCODONE (OXY IR/ROXICODONE) 5 MG immediate release tablet Take 5 mg by mouth every 6 (six) hours as needed for severe pain.    [provider]  pantoprazole (PROTONIX) 40 MG tablet Take 40 mg by mouth daily.    [provider]  sertraline (ZOLOFT) 50 MG tablet Take 50 mg by mouth daily.    [provider]  sevelamer carbonate (RENVELA) 800 MG tablet Take 2,400 mg by mouth 3 (three) times daily with meals. Give 3 tablets to = 2400 mg TID    [provider]  spironolactone (ALDACTONE) 100 MG tablet Take 100 mg daily by mouth. 02/19/17   [provider]    Family History Family History  Problem Relation Age of Onset  . Diabetes Mother   . Hypertension Mother   . Diabetes Father   . Hyperlipidemia Father   . Hypertension Father   . Diabetes Sister   . Stroke Maternal Grandmother   . Diabetes Sister   . Breast cancer Maternal Aunt        Age 36's    Social History Social History   Tobacco Use  . Smoking status: Former Smoker    Packs/day: 0.30    Years: 10.00    Pack years: 3.00    Types: Cigarettes    Last attempt to quit: 08/05/2011    Years since quitting: 5.8  . Smokeless tobacco: Never Used  Substance Use Topics  . Alcohol use: No  . Drug use: No     Allergies   Cephalexin and Sulfamethoxazole-trimethoprim   Review of Systems Review of Systems  All other systems reviewed and are  negative.    Physical Exam Updated Vital Signs BP (!) 204/97   Pulse 80   Temp 98.3 F (36.8 C) (Oral)   Resp (!) 23   Ht 1.93 m  (6\' 4" )   Wt 90.7 kg (200 lb)   LMP 10/09/2015 Comment: pt on dialysis  SpO2 98%   BMI 24.34 kg/m   Physical Exam  Constitutional: No distress.  Elderly, frail  HENT:  Head: Normocephalic and atraumatic.  Right Ear: External ear normal.  Left Ear: External ear normal.  Eyes: Conjunctivae are normal. Right eye exhibits no discharge. Left eye exhibits no discharge. No scleral icterus.  Neck: Neck supple. No tracheal deviation present.  Cardiovascular: Normal rate, regular rhythm and intact distal pulses.  Pulmonary/Chest: Effort normal and breath sounds normal. No stridor. No respiratory distress. She has no wheezes. She has no rales.  Abdominal: Soft. Bowel sounds are normal. She exhibits distension. There is no tenderness. There is no rebound and no guarding.  Ascites  Musculoskeletal: She exhibits no edema.       Right shoulder: Normal.       Left shoulder: Normal.       Right hip: She exhibits tenderness and bony tenderness.       Left hip: Normal.       Right knee: She exhibits no swelling. Tenderness found.       Cervical back: Normal. She exhibits no tenderness.       Thoracic back: Normal. She exhibits no tenderness.       Lumbar back: Normal. She exhibits no tenderness.  Status post partial amputation of her distal right foot, no surrounding erythema,; tenderness palpation right hip and right knee, no gross deformity  Neurological: She is alert. No cranial nerve deficit (no facial droop, extraocular movements intact, no slurred speech) or sensory deficit. She exhibits normal muscle tone. She displays no seizure activity. Coordination normal.  Dentalized weakness however the patient is able to move her arms and legs  Skin: Skin is warm and dry. No rash noted.  Psychiatric: She has a normal mood and affect.  Nursing note and vitals reviewed.    ED Treatments / Results  Labs (all labs ordered are listed, but only abnormal results are displayed) Labs Reviewed  CBC -  Abnormal; Notable for the following components:      Result Value   RBC 3.26 (*)    Hemoglobin 8.9 (*)    HCT 29.7 (*)    RDW 16.5 (*)    All other components within normal limits  BASIC METABOLIC PANEL - Abnormal; Notable for the following components:   Chloride 96 (*)    Glucose, Bld 132 (*)    BUN 29 (*)    Creatinine, Ser 3.77 (*)    Calcium 8.5 (*)    GFR calc non Af Amer 14 (*)    GFR calc Af Amer 16 (*)    All other components within normal limits  PROTIME-INR  APTT     Radiology Ct Head Wo Contrast  Result Date: 06/11/2017 CLINICAL DATA:  [place] and hit head EXAM: CT HEAD WITHOUT CONTRAST TECHNIQUE: Contiguous axial images were obtained from the base of the skull through the vertex without intravenous contrast. COMPARISON:  CT brain 02/02/2017 FINDINGS: Brain: 8 mm hyperdense focus within the left posterior parietal lobe with surrounding edema. No midline shift. The ventricles are of normal size. Vascular: No hyperdense vessels. Carotid artery calcification. Numerous small vessel calcifications at the skull base. Skull: Increased bilateral  mastoid effusion.  No fracture Sinuses/Orbits: Mucosal thickening in the left maxillary and ethmoid sinuses. No acute orbital abnormality Other: None IMPRESSION: Interim development of an 8 mm hyperdense focus within the left posterior parietal region with mild surrounding edema. Appearance is suspicious for small hemorrhagic mass. MRI is suggested for further evaluation. Critical Value/emergent results were called by telephone at the time of interpretation on 06/11/2017 at 6:49 pm to Dr. Dorie Rank , who verbally acknowledged these results. Electronically Signed   By: Donavan Foil M.D.   On: 06/11/2017 18:49   Dg Knee Complete 4 Views Right  Result Date: 06/11/2017 CLINICAL DATA:  Fall with knee pain EXAM: RIGHT KNEE - COMPLETE 4+ VIEW COMPARISON:  None. FINDINGS: No fracture or malalignment is seen. Mild patellofemoral degenerative changes.  Extensive vascular calcifications. No large effusion. IMPRESSION: Minimal degenerative changes.  No acute osseous abnormality. Electronically Signed   By: Donavan Foil M.D.   On: 06/11/2017 18:56   Dg Hip Unilat W Or Wo Pelvis 2-3 Views Right  Result Date: 06/11/2017 CLINICAL DATA:  Fall with right hip and knee pain EXAM: DG HIP (WITH OR WITHOUT PELVIS) 2-3V RIGHT COMPARISON:  None. FINDINGS: No acute fracture or dislocation of the right hip. Extensive vascular calcifications. A lucency at the right inferior pubic ramus is noted. Pubic symphysis is intact. Extensive vascular calcification IMPRESSION: 1. No fracture or malalignment of the proximal right femur 2. Lucency over the right inferior pubic ramus, uncertain if this is related to skin fold artifact or a nondisplaced fracture. Electronically Signed   By: Donavan Foil M.D.   On: 06/11/2017 18:55    Procedures Procedures (including critical care time)  Medications Ordered in ED Medications - No data to display   Initial Impression / Assessment and Plan / ED Course  I have reviewed the triage vital signs and the nursing notes.  Pertinent labs & imaging results that were available during my care of the patient were reviewed by me and considered in my medical decision making (see chart for details).  Clinical Course as of Jun 11 1926  Wed Jun 11, 2017  1924 I discussed the findings with the patient and her family.  [JK]    Clinical Course User Index [JK] Dorie Rank, MD  Pt has multiple medical problems.   She presents after a fall from her nh bed.   Hip xray shows questionable pubic ramus abnormality.  She could have a small fracture but this would not change management.  CT scan with also small area of hemorrhage, ?mass  .  Radiology recommends MRI for further characterization.  Unfortunately patient is unable to lie flat right now.  Patient has a significant amount of ascites and back right now.  She may end up needing a paracentesis.   Patient also complains of pain associated with a bedsore but we should be able to manage her pain to allow for the MRI.  I consulted Dr Cyndy Freeze, neurosurgery.  He is currently in the OR.  Will consult on pt when he is done. Considering her complex medical history and this abnormal CT scan finding consult the medical service for admission.  Final Clinical Impressions(s) / ED Diagnoses   Final diagnoses:  Brain mass  Fall, initial encounter  Chronic renal failure, unspecified CKD stage      Dorie Rank, MD 06/11/17 1610

## 2017-06-11 NOTE — Assessment & Plan Note (Signed)
Cardiology reassessment following rehabilitation at [facility]

## 2017-06-11 NOTE — Consult Note (Signed)
Reason for Consult:left parietal hyperdense mass Referring Physician: Malkie, [name] is an 42 y.o. female.  HPI: whom fell out of her bed, no LOC, this afternoon approximately 1300. She was brought to the ED approximately 1800. Exam during this time is normal neurologically. She has had chronic migrainous headaches. She does report a right sided headache today which has improved with time. Medical history is quite extensive. The hyperdensity is not appreciated on a head CT from 9/18.  There is surrounding edema, no mass effect.   Past Medical History:  Diagnosis Date  . Arthritis   . CHF (congestive heart failure) ([place])   . Chronic anemia    2nd to renal disease  . CIN III (cervical intraepithelial neoplasia grade III) with severe dysplasia    S/P LEEP AND CONE  . Coronary artery disease    Status post cardiac catheterization June 2012 scattered coronary artery disease/atherosclerosis with 70-80% stenosis in a small right PDA.  . Diabetic Charcot's joint disease ([place])   . DM (diabetes mellitus) ([place])    Long-term insulin  . End stage renal disease on dialysis [facility]) 05/02/11   "[facility]"; [facility]; M; W, F ("got it 04/15/2017 because of Thanksgiving")  . Fracture of 5th metatarsal 2016   Right  . Gastroparesis   . Heart murmur   . 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 History of abscesses in groins 12/06/2010  . Hyperlipidemia    Hypertriglyceridemia 449 HDL 25  . Hypertension   . Migraines    "just on dialysis days"  . Peripheral neuropathy    related to DM  . Peripheral vascular disease ([place])    Tibial occlusive disease evaluated by Dr. Kellie Simmering in August 2011. Medical therapy  . Renal insufficiency    Dialysis since 2012  . Seizures ([place])    "only when her sugar drops" (04/17/2017)  . Tobacco use disorder    Discontinued March 2012  . Trimalleolar fracture of ankle, closed 02/09/2015   Right    Past Surgical History:  Procedure Laterality Date  . ABDOMINAL AORTOGRAM  W/LOWER EXTREMITY [name] 03/27/2017   Procedure: ABDOMINAL AORTOGRAM W/LOWER EXTREMITY;  Surgeon: Conrad Red Lake, MD;  Location: [place] [facility];  Service: Cardiovascular;  Laterality: [name];  . AMPUTATION Right 05/09/2017   Procedure: AMPUTATION TRANSMETATARSAL OF RIGHT FOOT;  Surgeon: Serafina Mitchell, MD;  Location: [place];  Service: Vascular;  Laterality: Right;  . AV FISTULA PLACEMENT Left 04/2010   "lower arm"  . CARDIAC CATHETERIZATION [name] 02/10/2015   Procedure: Left Heart Cath and Coronary Angiography;  Surgeon: Dixie Dials, MD;  Location: [place] [facility];  Service: Cardiovascular;  Laterality: [name];  . CARDIAC CATHETERIZATION [name] 02/21/2016   Procedure: Right Heart Cath;  Surgeon: Jolaine Artist, MD;  Location: [place] [facility];  Service: Cardiovascular;  Laterality: [name];  . CATARACT EXTRACTION W/ INTRAOCULAR LENS  IMPLANT, BILATERAL Bilateral   . CERVICAL BIOPSY  W/ LOOP ELECTRODE EXCISION     h/o  . CERVICAL CONE BIOPSY     h/o  . COLPOSCOPY    . DILATION AND CURETTAGE OF UTERUS  2009  . ENDARTERECTOMY FEMORAL Right 04/16/2017   Procedure: RIGHT FEMORAL ENDARTERECTOMY;  Surgeon: Serafina Mitchell, MD;  Location: [place];  Service: Vascular;  Laterality: Right;  . FRACTURE SURGERY    . HERNIA REPAIR    . INCISION AND DRAINAGE ABSCESS     "groin"  . INSERTION OF DIALYSIS CATHETER Left 04/16/2017  Procedure: INSERTION of temporay DIALYSIS CATHETER, left femoral artery;  Surgeon: Serafina Mitchell, MD;  Location: [place];  Service: Vascular;  Laterality: Left;  . IR HYBRID TRAUMA EMBOLIZATION  04/16/2017  . ORIF ANKLE FRACTURE Right 02/16/2015   Procedure: OPEN REDUCTION INTERNAL FIXATION (ORIF) RIGHT ANKLE FRACTURE;  Surgeon: Altamese Healy, MD;  Location: [place];  Service: Orthopedics;  Laterality: Right;  . PATCH ANGIOPLASTY Right 04/16/2017   Procedure: PATCH ANGIOPLASTY OF RIGHT FEMORAL ARTERY USING BOVINE PERICARDIUM PATCH;  Surgeon: Serafina Mitchell, MD;  [facility];   Service: Vascular;  Laterality: Right;  . UMBILICAL HERNIA REPAIR  X 2  . WOUND EXPLORATION Right 05/09/2017   Procedure: EXPLORATION RIGHT GROIN;  Surgeon: Serafina Mitchell, MD;  Location: [facility];  Service: Vascular;  Laterality: Right;    Family History  Problem Relation Age of Onset  . Diabetes Mother   . Hypertension Mother   . Diabetes Father   . Hyperlipidemia Father   . Hypertension Father   . Diabetes Sister   . Stroke Maternal Grandmother   . Diabetes Sister   . Breast cancer Maternal Aunt        Age 70's    Social History:  reports that she quit smoking about 5 years ago. Her smoking use included cigarettes. She has a 3.00 pack-year smoking history. she has never used smokeless tobacco. She reports that she does not drink alcohol or use drugs.  Allergies:  Allergies  Allergen Reactions  . Cephalexin Other (See Comments)    Reaction unknown- Childhood allergy Tolerated Ceftriaxone in the past  . Sulfamethoxazole-Trimethoprim Other (See Comments)    Unknown reaction. Pt states that she was told by her mother that she had allergy to Bactrim as a child.    Medications: I have reviewed the patient's current medications.  Results for orders placed or performed during the hospital encounter of 06/11/17 (from the past 48 hour(s))  CBC     Status: Abnormal   Collection Time: 06/11/17  5:57 PM  Result Value Ref Range   WBC 9.3 4.0 - 10.5 K/uL   RBC 3.26 (L) 3.87 - 5.11 MIL/uL   Hemoglobin 8.9 (L) 12.0 - 15.0 g/dL   HCT 29.7 (L) 36.0 - 46.0 %   MCV 91.1 78.0 - 100.0 fL   MCH 27.3 26.0 - 34.0 pg   MCHC 30.0 30.0 - 36.0 g/dL   RDW 16.5 (H) 11.5 - 15.5 %   Platelets 281 150 - 400 K/uL  Basic metabolic panel     Status: Abnormal   Collection Time: 06/11/17  5:57 PM  Result Value Ref Range   Sodium 138 135 - 145 mmol/L   Potassium 3.9 3.5 - 5.1 mmol/L   Chloride 96 (L) 101 - 111 mmol/L   CO2 27 22 - 32 mmol/L   Glucose, Bld 132 (H) 65 - 99 mg/dL   BUN 29 (H) 6 - 20  mg/dL   Creatinine, Ser 3.77 (H) 0.44 - 1.00 mg/dL   Calcium 8.5 (L) 8.9 - 10.3 mg/dL   GFR calc non Af Amer 14 (L) >60 mL/min   GFR calc Af Amer 16 (L) >60 mL/min    Comment: (NOTE) The eGFR has been calculated using the CKD EPI equation. This calculation has not been validated in all clinical situations. eGFR's persistently <60 mL/min signify possible Chronic Kidney Disease.    Anion gap 15 5 - 15  Protime-INR     Status: None   Collection Time:  06/11/17  7:22 PM  Result Value Ref Range   Prothrombin Time 15.1 11.4 - 15.2 seconds   INR 1.20   APTT     Status: Abnormal   Collection Time: 06/11/17  7:22 PM  Result Value Ref Range   aPTT 44 (H) 24 - 36 seconds    Comment:        IF BASELINE aPTT IS ELEVATED, SUGGEST PATIENT RISK ASSESSMENT BE USED TO DETERMINE APPROPRIATE ANTICOAGULANT THERAPY.     Ct Head Wo Contrast  Result Date: 06/11/2017 CLINICAL DATA:  [place] and hit head EXAM: CT HEAD WITHOUT CONTRAST TECHNIQUE: Contiguous axial images were obtained from the base of the skull through the vertex without intravenous contrast. COMPARISON:  CT brain 02/02/2017 FINDINGS: Brain: 8 mm hyperdense focus within the left posterior parietal lobe with surrounding edema. No midline shift. The ventricles are of normal size. Vascular: No hyperdense vessels. Carotid artery calcification. Numerous small vessel calcifications at the skull base. Skull: Increased bilateral mastoid effusion.  No fracture Sinuses/Orbits: Mucosal thickening in the left maxillary and ethmoid sinuses. No acute orbital abnormality Other: None IMPRESSION: Interim development of an 8 mm hyperdense focus within the left posterior parietal region with mild surrounding edema. Appearance is suspicious for small hemorrhagic mass. MRI is suggested for further evaluation. Critical Value/emergent results were called by telephone at the time of interpretation on 06/11/2017 at 6:49 pm to Dr. Dorie Rank , who verbally acknowledged these  results. Electronically Signed   By: Donavan Foil M.D.   On: 06/11/2017 18:49   Dg Knee Complete 4 Views Right  Result Date: 06/11/2017 CLINICAL DATA:  Fall with knee pain EXAM: RIGHT KNEE - COMPLETE 4+ VIEW COMPARISON:  None. FINDINGS: No fracture or malalignment is seen. Mild patellofemoral degenerative changes. Extensive vascular calcifications. No large effusion. IMPRESSION: Minimal degenerative changes.  No acute osseous abnormality. Electronically Signed   By: Donavan Foil M.D.   On: 06/11/2017 18:56   Dg Hip Unilat W Or Wo Pelvis 2-3 Views Right  Result Date: 06/11/2017 CLINICAL DATA:  Fall with right hip and knee pain EXAM: DG HIP (WITH OR WITHOUT PELVIS) 2-3V RIGHT COMPARISON:  None. FINDINGS: No acute fracture or dislocation of the right hip. Extensive vascular calcifications. A lucency at the right inferior pubic ramus is noted. Pubic symphysis is intact. Extensive vascular calcification IMPRESSION: 1. No fracture or malalignment of the proximal right femur 2. Lucency over the right inferior pubic ramus, uncertain if this is related to skin fold artifact or a nondisplaced fracture. Electronically Signed   By: Donavan Foil M.D.   On: 06/11/2017 18:55    Review of Systems  Cardiovascular: Positive for leg swelling.  Gastrointestinal: Negative.   Neurological: Positive for seizures and weakness.  Endo/Heme/Allergies: Negative.   Psychiatric/Behavioral: Negative.    Blood pressure (!) 195/77, pulse 78, temperature 98.3 F (36.8 C), temperature source Oral, resp. rate (!) 23, height _0  (1.93 m), weight 90.7 kg (200 lb), last menstrual period 10/09/2015, SpO2 94 %. Physical Exam  Constitutional: She is oriented to person, place, and time. She appears well-developed and well-nourished. No distress.  HENT:  Head: Normocephalic and atraumatic.  Right Ear: External ear normal.  Left Ear: External ear normal.  Mouth/Throat: Oropharynx is clear and moist.  Eyes: Conjunctivae and EOM  are normal. Pupils are equal, round, and reactive to light.  Neck: Normal range of motion. Neck supple.  Cardiovascular: Normal rate and regular rhythm.  Respiratory: Effort normal and breath sounds normal.  Musculoskeletal: Normal range of motion.  Neurological: She is alert and oriented to person, place, and time. She has normal strength and normal reflexes. A sensory deficit is present. No cranial nerve deficit. Coordination normal.  Peripheral neuropathy  Gait is not assessed Toe amputation right foot  no drift on Barre testing.  Assessment/Plan: 42 yo woman with multiple medical problems. 40m hyperdensity in left parietal lobe on CT with edema. No real mass effect.  Agree with MRI, but it is not an urgent matter. She is not an operative candidate at this time. She is completely asymptomatic with regards to the mass. Once MRI obtained I will review, or if she remains unAble to have the mri, this can be done as an outpatient  Jaylen Knope L 06/11/2017, 8:09 PM

## 2017-06-11 NOTE — Assessment & Plan Note (Signed)
HD Monday, Wednesday, Friday

## 2017-06-11 NOTE — Assessment & Plan Note (Signed)
Wound Care at [facility]

## 2017-06-11 NOTE — ED Notes (Signed)
Pt stated that she cannot produce urine but needed to use the restroom for defecation. She said that she can't use a bedpan and would need to defecate in her brief, followed by cleanup and a new brief.

## 2017-06-11 NOTE — ED Triage Notes (Signed)
Pt arrives EMS from [facility] with c/o fall from bed to floor. Pt did receive dilaysis today. Denies LOC but c/o headache and right leg pain.

## 2017-06-11 NOTE — ED Notes (Signed)
Patient transported to CT 

## 2017-06-12 ENCOUNTER — Inpatient Hospital Stay (HOSPITAL_COMMUNITY): Payer: Medicare Other

## 2017-06-12 ENCOUNTER — Encounter (HOSPITAL_COMMUNITY): Payer: Self-pay | Admitting: Radiology

## 2017-06-12 DIAGNOSIS — K7011 Alcoholic hepatitis with ascites: Secondary | ICD-10-CM

## 2017-06-12 DIAGNOSIS — N189 Chronic kidney disease, unspecified: Secondary | ICD-10-CM

## 2017-06-12 HISTORY — PX: IR PARACENTESIS: IMG2679

## 2017-06-12 LAB — BODY FLUID CELL COUNT WITH DIFFERENTIAL
Lymphs, Fluid: 14 %
Monocyte-Macrophage-Serous Fluid: 50 % (ref 50–90)
NEUTROPHIL FLUID: 36 % — AB (ref 0–25)
Total Nucleated Cell Count, Fluid: 634 cu mm (ref 0–1000)

## 2017-06-12 LAB — HEMOGLOBIN A1C
Hgb A1c MFr Bld: 5.4 % (ref 4.8–5.6)
MEAN PLASMA GLUCOSE: 108.28 mg/dL

## 2017-06-12 LAB — GRAM STAIN: GRAM STAIN: NONE SEEN

## 2017-06-12 LAB — ALBUMIN, PLEURAL OR PERITONEAL FLUID: ALBUMIN FL: 1.4 g/dL

## 2017-06-12 LAB — BASIC METABOLIC PANEL
Anion gap: 14 (ref 5–15)
BUN: 37 mg/dL — AB (ref 6–20)
CALCIUM: 8.7 mg/dL — AB (ref 8.9–10.3)
CHLORIDE: 97 mmol/L — AB (ref 101–111)
CO2: 28 mmol/L (ref 22–32)
CREATININE: 4.58 mg/dL — AB (ref 0.44–1.00)
GFR calc Af Amer: 13 mL/min — ABNORMAL LOW (ref >60)
GFR calc non Af Amer: 11 mL/min — ABNORMAL LOW (ref >60)
Glucose, Bld: 125 mg/dL — ABNORMAL HIGH (ref 65–99)
Potassium: 4.1 mmol/L (ref 3.5–5.1)
SODIUM: 139 mmol/L (ref 135–145)

## 2017-06-12 LAB — PROCALCITONIN: Procalcitonin: 0.82 ng/mL

## 2017-06-12 LAB — MAGNESIUM: MAGNESIUM: 1.7 mg/dL (ref 1.7–2.4)

## 2017-06-12 LAB — PROTEIN, PLEURAL OR PERITONEAL FLUID: Total protein, fluid: 4 g/dL

## 2017-06-12 LAB — CBC
HCT: 28.3 % — ABNORMAL LOW (ref 36.0–46.0)
Hemoglobin: 8.9 g/dL — ABNORMAL LOW (ref 12.0–15.0)
MCH: 28.5 pg (ref 26.0–34.0)
MCHC: 31.4 g/dL (ref 30.0–36.0)
MCV: 90.7 fL (ref 78.0–100.0)
PLATELETS: 268 10*3/uL (ref 150–400)
RBC: 3.12 MIL/uL — ABNORMAL LOW (ref 3.87–5.11)
RDW: 16.8 % — AB (ref 11.5–15.5)
WBC: 8.7 10*3/uL (ref 4.0–10.5)

## 2017-06-12 LAB — GLUCOSE, CAPILLARY
GLUCOSE-CAPILLARY: 98 mg/dL (ref 65–99)
GLUCOSE-CAPILLARY: 80 mg/dL (ref 65–99)

## 2017-06-12 LAB — SEDIMENTATION RATE: Sed Rate: 115 mm/hr — ABNORMAL HIGH (ref 0–22)

## 2017-06-12 LAB — CBG MONITORING, ED
GLUCOSE-CAPILLARY: 113 mg/dL — AB (ref 65–99)
GLUCOSE-CAPILLARY: 74 mg/dL (ref 65–99)
Glucose-Capillary: 139 mg/dL — ABNORMAL HIGH (ref 65–99)

## 2017-06-12 LAB — GLUCOSE, PLEURAL OR PERITONEAL FLUID: Glucose, Fluid: 121 mg/dL

## 2017-06-12 MED ORDER — DARBEPOETIN ALFA 150 MCG/0.3ML IJ SOSY
150.0000 ug | PREFILLED_SYRINGE | INTRAMUSCULAR | Status: DC
  Filled 2017-06-12: qty 0.3

## 2017-06-12 MED ORDER — VANCOMYCIN HCL IN DEXTROSE 1-5 GM/200ML-% IV SOLN
1000.0000 mg | INTRAVENOUS | Status: DC
Start: 2017-06-13 — End: 2017-06-18
  Administered 2017-06-13 – 2017-06-18 (×4): 1000 mg via INTRAVENOUS
  Filled 2017-06-12 (×4): qty 200

## 2017-06-12 MED ORDER — ENOXAPARIN SODIUM 40 MG/0.4ML ~~LOC~~ SOLN: 40.0000 mg | SUBCUTANEOUS | Status: DC

## 2017-06-12 MED ORDER — LIDOCAINE 2% (20 MG/ML) 5 ML SYRINGE
INTRAMUSCULAR | Status: AC
  Filled 2017-06-12: qty 10

## 2017-06-12 MED ORDER — ALBUMIN HUMAN 25 % IV SOLN
50.0000 g | Freq: Once | INTRAVENOUS | Status: AC
  Administered 2017-06-12: 50 g via INTRAVENOUS
  Filled 2017-06-12 (×2): qty 200

## 2017-06-12 MED ORDER — DAKINS (1/4 STRENGTH) 0.125 % EX SOLN
Freq: Two times a day (BID) | CUTANEOUS | Status: AC
  Administered 2017-06-13: 16:00:00
  Administered 2017-06-14: 1
  Administered 2017-06-14: 14:00:00
  Filled 2017-06-12 (×2): qty 473

## 2017-06-12 MED ORDER — MORPHINE SULFATE (PF) 4 MG/ML IV SOLN
4.0000 mg | Freq: Once | INTRAVENOUS | Status: AC | PRN
  Administered 2017-06-13: 4 mg via INTRAVENOUS
  Filled 2017-06-12: qty 1

## 2017-06-12 MED ORDER — HYDRALAZINE HCL 20 MG/ML IJ SOLN
10.0000 mg | INTRAMUSCULAR | Status: DC | PRN
  Administered 2017-06-15 – 2017-06-23 (×7): 10 mg via INTRAVENOUS
  Filled 2017-06-12 (×6): qty 1

## 2017-06-12 MED ORDER — PIPERACILLIN-TAZOBACTAM 3.375 G IVPB
3.3750 g | Freq: Two times a day (BID) | INTRAVENOUS | Status: DC
  Administered 2017-06-12 – 2017-06-18 (×10): 3.375 g via INTRAVENOUS
  Filled 2017-06-12 (×13): qty 50

## 2017-06-12 MED ORDER — VANCOMYCIN HCL 10 G IV SOLR
2000.0000 mg | Freq: Once | INTRAVENOUS | Status: AC
  Administered 2017-06-12: 2000 mg via INTRAVENOUS
  Filled 2017-06-12: qty 2000

## 2017-06-12 MED ORDER — LIDOCAINE HCL (PF) 2 % IJ SOLN
INTRAMUSCULAR | Status: DC | PRN
  Administered 2017-06-12: 10 mL

## 2017-06-12 NOTE — ED Notes (Signed)
Patient remains in xray for IR paracentesis

## 2017-06-12 NOTE — H&P (View-Only) (Signed)
Hospital Consult    Reason for Consult:  Right groin wound and non healing foot wound Requesting Physician:  Dr. Allyson Sabal MRN #:  [id number]  History of Present Illness: This is a 42 y.o. female who underwent a right CFA, profunda, SFA and external iliac endarterectomy with bovine patch angioplasty on 04/16/17 and during that time, she became unstable and received CPR & was transferred to the ICU.  She subsequently was neuro in tact and pressors weaned.  She eventually had a tracheostomy. A JP drain was placed during surgery and was left for quite some time due to increased drainage.  The was eventually removed.  She did have healing issues in her right groin and needed extensive wound care.  Once she became more stable, she underwent a right TMA and exploration of her groin with evacuation of seroma and wound vac placement on 05/09/17.    She had gone to [facility] and was discharged from there on January 15th.  She has been weaned from the trach and is now on room air.  She tells me that she had a wound vac placed in [facility] and states her groin wound got much better.  It was removed at discharge and was supposed to be put back on at [facility], but she had a fall and was readmitted back to the hospital yesterday.  She states that she continues to have a lot of drainage from the groin and has to change her gown & linens often.  She tells me that her right foot looks worse.    She was brought to the ER yesterday after falling at the nursing facility & hitting her head.  She did not loose consciousness.   In the ER, she had a head CT, which showed a hyperdense area concerning for hemorrhagic mass and neurosurgery was consulted and an MRI was ordered.  She has been asymptomatic with regards to mass.    In the ER, she was also found to have ascites likely from fluid overload and underwent a paracentesis today with removal of 9.8L of fluid.  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  ID will be consulted.  She has ESRD and dialyzes on M/W/F.    Past Medical History:  Diagnosis Date  . Arthritis   . CHF (congestive heart failure) ([place])   . Chronic anemia    2nd to renal disease  . CIN III (cervical intraepithelial neoplasia grade III) with severe dysplasia    S/P LEEP AND CONE  . Coronary artery disease    Status post cardiac catheterization June 2012 scattered coronary artery disease/atherosclerosis with 70-80% stenosis in a small right PDA.  . Diabetic Charcot's joint disease ([place])   . DM (diabetes mellitus) ([place])    Long-term insulin  . End stage renal disease on dialysis [facility]) 05/02/11   "[facility]"; [facility]; M; W, F ("got it 04/15/2017 because of Thanksgiving")  . Fracture of 5th metatarsal 2016   Right  . Gastroparesis   . Heart murmur   . 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 History of abscesses in groins 12/06/2010  . Hyperlipidemia    Hypertriglyceridemia 449 HDL 25  . Hypertension   . Migraines    "just on dialysis days"  . Peripheral neuropathy    related to DM  . Peripheral vascular disease ([place])    Tibial occlusive disease evaluated by Dr. Kellie Simmering in August 2011. Medical therapy  . Renal insufficiency    Dialysis since  2012  . Seizures ([place])    "only when her sugar drops" (04/17/2017)  . Tobacco use disorder    Discontinued March 2012  . Trimalleolar fracture of ankle, closed 02/09/2015   Right    Past Surgical History:  Procedure Laterality Date  . ABDOMINAL AORTOGRAM W/LOWER EXTREMITY [name] 03/27/2017   Procedure: ABDOMINAL AORTOGRAM W/LOWER EXTREMITY;  Surgeon: Conrad Ball Club, MD;  Location: [place] [facility];  Service: Cardiovascular;  Laterality: [name];  . AMPUTATION Right 05/09/2017   Procedure: AMPUTATION TRANSMETATARSAL OF RIGHT FOOT;  Surgeon: Serafina Mitchell, MD;  Location: [place];  Service: Vascular;  Laterality: Right;  . AV FISTULA PLACEMENT Left 04/2010   "lower arm"  . CARDIAC CATHETERIZATION [name] 02/10/2015   Procedure:  Left Heart Cath and Coronary Angiography;  Surgeon: Dixie Dials, MD;  Location: [place] [facility];  Service: Cardiovascular;  Laterality: [name];  . CARDIAC CATHETERIZATION [name] 02/21/2016   Procedure: Right Heart Cath;  Surgeon: Jolaine Artist, MD;  Location: [place] [facility];  Service: Cardiovascular;  Laterality: [name];  . CATARACT EXTRACTION W/ INTRAOCULAR LENS  IMPLANT, BILATERAL Bilateral   . CERVICAL BIOPSY  W/ LOOP ELECTRODE EXCISION     h/o  . CERVICAL CONE BIOPSY     h/o  . COLPOSCOPY    . DILATION AND CURETTAGE OF UTERUS  2009  . ENDARTERECTOMY FEMORAL Right 04/16/2017   Procedure: RIGHT FEMORAL ENDARTERECTOMY;  Surgeon: Serafina Mitchell, MD;  Location: [place];  Service: Vascular;  Laterality: Right;  . FRACTURE SURGERY    . HERNIA REPAIR    . INCISION AND DRAINAGE ABSCESS     "groin"  . INSERTION OF DIALYSIS CATHETER Left 04/16/2017   Procedure: INSERTION of temporay DIALYSIS CATHETER, left femoral artery;  Surgeon: Serafina Mitchell, MD;  Location: [place];  Service: Vascular;  Laterality: Left;  . IR HYBRID TRAUMA EMBOLIZATION  04/16/2017  . ORIF ANKLE FRACTURE Right 02/16/2015   Procedure: OPEN REDUCTION INTERNAL FIXATION (ORIF) RIGHT ANKLE FRACTURE;  Surgeon: Altamese Parcelas Mandry, MD;  Location: [place];  Service: Orthopedics;  Laterality: Right;  . PATCH ANGIOPLASTY Right 04/16/2017   Procedure: PATCH ANGIOPLASTY OF RIGHT FEMORAL ARTERY USING BOVINE PERICARDIUM PATCH;  Surgeon: Serafina Mitchell, MD;  Location: [facility];  Service: Vascular;  Laterality: Right;  . UMBILICAL HERNIA REPAIR  X 2  . WOUND EXPLORATION Right 05/09/2017   Procedure: EXPLORATION RIGHT GROIN;  Surgeon: Serafina Mitchell, MD;  Location: [place];  Service: Vascular;  Laterality: Right;    Allergies  Allergen Reactions  . Cephalexin Other (See Comments)    Reaction unknown- Childhood allergy Tolerated Ceftriaxone in the past  . Sulfamethoxazole-Trimethoprim Other (See Comments)    Unknown reaction. Pt states that  she was told by her mother that she had allergy to Bactrim as a child.    Prior to Admission medications   Medication Sig Start Date End Date Taking? Authorizing Provider  Amino Acids-Protein Hydrolys (FEEDING SUPPLEMENT, PRO-STAT SUGAR FREE 64,) LIQD Take 30 mLs by mouth 2 (two) times daily.   Yes [provider]  amLODipine (NORVASC) 10 MG tablet Take 10 mg by mouth 2 (two) times daily.    Yes [provider]  aspirin EC 81 MG EC tablet Take 1 tablet (81 mg total) by mouth daily. 03/30/17  Yes Alma Friendly, MD  atorvastatin (LIPITOR) 40 MG tablet Take 40 mg by mouth daily at 6 PM.    Yes [provider]  b complex-vitamin c-folic  acid (NEPHRO-VITE) 0.8 MG TABS tablet Take 1 tablet by mouth daily.   Yes [provider]  cloNIDine (CATAPRES) 0.3 MG tablet Take 0.3 mg by mouth 3 (three) times daily. 03/14/16  Yes [provider]  docusate (COLACE) 50 MG/5ML liquid Take 100 mg by mouth 3 (three) times daily.   Yes [provider]  fentaNYL (DURAGESIC - DOSED MCG/HR) 25 MCG/HR patch Place 25 mcg onto the skin every 3 (three) days.   Yes [provider]  hydrALAZINE (APRESOLINE) 100 MG tablet Take 1 tablet (100 mg total) by mouth every 8 (eight) hours. 02/24/16  Yes Tat, Shanon Brow, MD  Insulin Glargine (LANTUS SOLOSTAR) 100 UNIT/ML Solostar Pen Inject 14 Units into the skin daily at 10 pm.    Yes [provider]  Insulin Glargine (LANTUS SOLOSTAR) 100 UNIT/ML Solostar Pen Inject 20 Units into the skin every morning.   Yes [provider]  isosorbide dinitrate (ISORDIL) 30 MG tablet Take 60 mg by mouth 2 (two) times daily. Give two 30 mg tablets to = 60 mg BID   Yes [provider]  labetalol (NORMODYNE) 300 MG tablet Take 1 tablet (300 mg total) by mouth 3 (three) times daily. 11/01/14  Yes Debbe Odea, MD  LORazepam (ATIVAN) 1 MG tablet Take 1 mg by mouth every 6 (six) hours as needed for anxiety.   Yes  [provider]  metoCLOPramide (REGLAN) 10 MG tablet Take 10 mg by mouth 3 (three) times daily.   Yes [provider]  ondansetron (ZOFRAN) 4 MG tablet Take 4 mg by mouth every 6 (six) hours as needed for nausea or vomiting.   Yes [provider]  oxyCODONE (OXY IR/ROXICODONE) 5 MG immediate release tablet Take 5 mg by mouth every 6 (six) hours as needed for severe pain.   Yes [provider]  pantoprazole (PROTONIX) 40 MG tablet Take 40 mg by mouth daily.   Yes [provider]  sertraline (ZOLOFT) 50 MG tablet Take 50 mg by mouth daily.   Yes [provider]  sevelamer carbonate (RENVELA) 800 MG tablet Take 2,400 mg by mouth 3 (three) times daily with meals. Give 3 tablets to = 2400 mg TID   Yes [provider]  spironolactone (ALDACTONE) 100 MG tablet Take 100 mg daily by mouth. 02/19/17  Yes [provider]  Darbepoetin Alfa (ARANESP, ALBUMIN FREE,) 200 MCG/ML SOLN Inject 200 mg as directed. Give at dialysis on Wednesday    [provider]  multivitamin (RENA-VIT) TABS tablet Take 1 tablet by mouth at bedtime. 03/29/17   Alma Friendly, MD    Social History   Socioeconomic History  . Marital status: Married    Spouse name: Not on file  . Number of children: Not on file  . Years of education: Not on file  . Highest education level: Not on file  Social Needs  . Financial resource strain: Not on file  . Food insecurity - worry: Not on file  . Food insecurity - inability: Not on file  . Transportation needs - medical: Not on file  . Transportation needs - non-medical: Not on file  Occupational History    Employer: [email or web address]  Tobacco Use  . Smoking status: Former Smoker    Packs/day: 0.30    Years: 10.00    Pack years: 3.00    Types: Cigarettes    Last attempt to quit: 08/05/2011    Years since quitting: 5.8  . Smokeless tobacco: Never Used  Substance and Sexual Activity  . Alcohol use: No  . Drug  use: No  . Sexual activity: Not on file  Other Topics Concern  . Not on file  Social History Narrative  . Not on file     Family History  Problem Relation Age of Onset  . Diabetes Mother   . Hypertension Mother   . Diabetes Father   . Hyperlipidemia Father   . Hypertension Father   . Diabetes Sister   . Stroke Maternal Grandmother   . Diabetes Sister   . Breast cancer Maternal Aunt        Age 75's    ROS:  See HPI  Physical Examination  Vitals:   06/12/17 1400 06/12/17 1430  BP: (!) 151/67 (!) 160/69  Pulse: 73 73  Resp:    Temp:    SpO2: 93% 97%   Body mass index is 24.34 kg/m.  General:  thin in NAD Gait: Not observed HENT: WNL, normocephalic Pulmonary: normal non-labored breathing Skin: without rashes Vascular Exam/Pulses:  Right groin-mildly odorous  Extremities:   Right foot dorsum  Right foot plantar  Musculoskeletal: no muscle wasting or atrophy  Neurologic: A&O X 3;  No focal weakness or paresthesias are detected; speech is fluent/normal Psychiatric:  The pt has Normal affect.   CBC    Component Value Date/Time   WBC 8.7 06/12/2017 0813   RBC 3.12 (L) 06/12/2017 0813   HGB 8.9 (L) 06/12/2017 0813   HCT 28.3 (L) 06/12/2017 0813   PLT 268 06/12/2017 0813   MCV 90.7 06/12/2017 0813   MCH 28.5 06/12/2017 0813   MCHC 31.4 06/12/2017 0813   RDW 16.8 (H) 06/12/2017 0813   LYMPHSABS 0.9 05/13/2017 0746   MONOABS 1.0 05/13/2017 0746   EOSABS 0.3 05/13/2017 0746   BASOSABS 0.0 05/13/2017 0746    BMET    Component Value Date/Time   NA 139 06/12/2017 0813   K 4.1 06/12/2017 0813   CL 97 (L) 06/12/2017 0813   CO2 28 06/12/2017 0813   GLUCOSE 125 (H) 06/12/2017 0813   BUN 37 (H) 06/12/2017 0813   CREATININE 4.58 (H) 06/12/2017 0813   CALCIUM 8.7 (L) 06/12/2017 0813   GFRNONAA 11 (L) 06/12/2017 0813   GFRAA 13 (L) 06/12/2017 0813    COAGS: Lab Results  Component Value Date   INR 1.20 06/11/2017   INR 1.19 05/09/2017   INR 1.12  05/09/2017    ASSESSMENT/PLAN: This is a 42 y.o. female who had right CFA, profunda, SFA and external iliac endarterectomy with bovine patch angioplasty on 62/69/48 complicated by CPR, right TMA and right groin exploration on 05/09/17 and extended hospital LTACH stay who presents back to the hospital after dc to SNF with fall.  [facility] is consulted to check right groin and TMA.   -pt's right groin has improved significantly since I saw it 2-3 weeks ago.  There is also less odor associated with this wound.  She tells me that it seemed to get better with a wound vac but this was not put back in place at the SNF bc she fell before it could get placed.   Will do dry to dry dressing to right groin given the amount of drainage, which is most likely lymphatic.  Dr. Bridgett Larsson will be by to see pt today. -the right TMA wound has progressed and it is possible that she will need a more proximal amputation.   -Dr. Trula Slade will be back tomorrow and will inspect the wound  and make decision about wound vac and right foot.  Please keep the right foot floated off of the bed  -dry dressing to right groin bid and as needed until decision is made about the wound vac.  -right foot dressed with kerlix  [name], PA-C [facility] [phone]  Addendum  I have independently interviewed and examined the patient, and I agree with the physician assistant's findings.  R groin continues to drain copious amounts of serous fluid consistent with seroma.  Mild amount of necrotic fat in R groin wound.  R TMA is non-viable.  - Santyl to R groin BID with dry dressing BID and prn - Once R groin wound cleans up, would go back to [facility] - Pt will need R BKA vs AKA iff medically stable - Dr. Trula Slade will be by tomorrow - No immediate surgical needs.   Adele Barthel, MD, FACS [facility] [place] Office: [phone] Pager: [phone]  06/12/2017, 5:21 PM

## 2017-06-12 NOTE — ED Notes (Signed)
Patient is stable and ready to be transport to the floor at this time.  Report was called to [facility].  Belongings taken with the patient to the floor.

## 2017-06-12 NOTE — ED Notes (Signed)
Pt turned and pillow placed between legs

## 2017-06-12 NOTE — Progress Notes (Signed)
Pharmacy Antibiotic Note  [name] is a 42 y.o. female admitted on 06/11/2017 with wound infection.   Plan: Zosyn 3.375 gm iv q12h Vanc 2g x 1 then 1 g with HD MWF F/U any changes to HD schedule Monitor cx vanc lvls prn  Height: 6\' 4"  (193 cm) Weight: 200 lb (90.7 kg) IBW/kg (Calculated) : 82.3  Temp (24hrs), Avg:98.9 F (37.2 C), Min:98.3 F (36.8 C), Max:99.5 F (37.5 C)  Recent Labs  Lab 06/06/17 0711 06/09/17 0500 06/11/17 1757 06/12/17 0813  WBC 11.9* 9.5 9.3 8.7  CREATININE 5.44* 6.14* 3.77* 4.58*    Estimated Creatinine Clearance: 21 mL/min (A) (by C-G formula based on SCr of 4.58 mg/dL (H)).    Allergies  Allergen Reactions  . Cephalexin Other (See Comments)    Reaction unknown- Childhood allergy Tolerated Ceftriaxone in the past  . Sulfamethoxazole-Trimethoprim Other (See Comments)    Unknown reaction. Pt states that she was told by her mother that she had allergy to Bactrim as a child.   Levester Fresh, PharmD, BCPS, BCCCP Clinical Pharmacist Clinical phone for 06/12/2017 from 7a-3:30p: [phone] If after 3:30p, please call main pharmacy at: [phone] 06/12/2017 10:52 AM

## 2017-06-12 NOTE — ED Notes (Signed)
Returned from IR

## 2017-06-12 NOTE — Procedures (Signed)
PROCEDURE SUMMARY:  Successful US guided paracentesis from RLQ.  Yielded 9.8 L of hazy, dark yellow fluid.  No immediate complications.  Pt tolerated well.   Specimen was sent for labs.  Ascencion Dike PA-C 06/12/2017 1:24 PM

## 2017-06-12 NOTE — Progress Notes (Addendum)
[facility] PROGRESS NOTE  [name] JJK:[id number] DOB: 30-Mar-1976 DOA: 06/11/2017   PCP: Merrilee Seashore, MD     Assessment/Plan: Active Problems:   End stage renal disease on dialysis [facility])   PAD (peripheral artery disease) ([place])   CAD (coronary artery disease)   Ascites   Fluid overload   Hypertension   Brain mass   Fall      [name] is a 42 y.o. female with history of ESRD on hemodialysis Monday Wednesday Friday recent cardiac arrest in April 16, 2017 and eventually had ventilator dependent respiratory failure and was discharged to long-term acute care and was recently discharged and tracheostomy removed was brought to the ER after patient had a fall at her living facility.  In the ER CT of the head done shows 8 mm hyperdense concerning for hemorrhagic mass in the left parietal area.  On-call neurosurgeon Dr. Christella Noa was consulted and requested MRI brain.  X-ray of the pelvis was showing possibility of pelvic fracture of the skin fold.  CT pelvis was ordered which shows no acute fracture but does show worsening sacral tachycardia but also with osteomyelitis.  On exam patient also has significantly increased ascites , s/p  paracentesis in the morning.  Patient unable to lie flat because of the ascites and sacral decubitus ulcers and because of which MRI was unable to be done.  Assessment and plan Fall with head CT showing hemorrhagic mass - 24mm hyperdensity in left parietal lobe on CT with edema. No real mass effect.She is not an operative candidate .  appreciate neurosurgery consult.  MRI brain pending for further evaluation of patient's hemorrhagic mass  Ascites likely from fluid overload -s/p  paracentesis  With removal of 9.8L. Labs . No signs of SBP, will give albumin due to large volume paracentesis   Sacral decubitus ulcer with CAT scan showing possibility of osteomyelitis/drainage from right groin at the site where angiogram was performed  -    Started on empiric abx . Vascular surgery consulted. Follow BC results ,WOC following   ESRD on hemodialysis on Monday Wednesday and Friday -has had dialysis today. Nephrology consulted   Hypertension uncontrolled -patient is on   amlodipine , labetalol spironolactone Isordil. Patient's hydralazine and clonidine have been held due to large volume paracentesis this morning and risk of hypotension. Patient also on PRN IV hydralazine.  Anemia likely from ESRD -follow CBC.   Peripheral vascular disease status post right foot transmetatarsal amputation on also right femoral endarterectomy. Patient  Stump looks gangrenous and  vascular surgery has been consulted  CAD -denies any chest pain.   DVT prophylaxsis lovenox   Code Status:  Full    Family Communication: Discussed in detail with the patient, all imaging results, lab results explained to the patient   Disposition Plan:    In 3-4 days       Consultants:  Vascular   Procedures:   none   Antibiotics: Anti-infectives (From admission, onward)   Start     Dose/Rate Route Frequency Ordered Stop   06/13/17 1200  vancomycin (VANCOCIN) IVPB 1000 mg/200 mL premix     1,000 mg 200 mL/hr over 60 Minutes Intravenous Every M-W-F (Hemodialysis) 06/12/17 1052     06/12/17 1100  vancomycin (VANCOCIN) 2,000 mg in sodium chloride 0.9 % 500 mL IVPB     2,000 mg 250 mL/hr over 120 Minutes Intravenous  Once 06/12/17 1052 06/12/17 1330   06/12/17 1100  piperacillin-tazobactam (ZOSYN)  IVPB 3.375 g     3.375 g 12.5 mL/hr over 240 Minutes Intravenous Every 12 hours 06/12/17 1052           HPI/Subjective: States that she has had profound drainage from her right groin  Objective: Vitals:   06/12/17 1311 06/12/17 1315 06/12/17 1330 06/12/17 1345  BP: (!) 159/53 (!) 162/58  (!) 160/66  Pulse:   74 74  Resp:   20   Temp:      TempSrc:      SpO2:   94% 96%  Weight:      Height:        Intake/Output Summary (Last 24 hours) at  06/12/2017 1444 Last data filed at 06/12/2017 0258 Gross per 24 hour  Intake 222 ml  Output -  Net 222 ml    Exam:  Examination:  General exam: Appears calm and comfortable  Respiratory system: Clear to auscultation. Respiratory effort normal. Cardiovascular system: S1 & S2 heard, RRR. No JVD, murmurs, rubs, gallops or clicks. No pedal edema. Gastrointestinal system: Abdomen is nondistended, soft and nontender. No organomegaly or masses felt. Normal bowel sounds heard. Drainage from the right groin Central nervous system: Alert and oriented. No focal neurological deficits. Extremities:  Right foot metatarsal amputation with gangrenous stump Skin: Transmetatarsal amputation of the right foot Psychiatry: Judgement and insight appear normal. Mood & affect appropriate.     Data Reviewed: I have personally reviewed following labs and imaging studies  Micro Results No results found for this or any previous visit (from the past 240 hour(s)).  Radiology Reports Ct Head Wo Contrast  Result Date: 06/11/2017 CLINICAL DATA:  [place] and hit head EXAM: CT HEAD WITHOUT CONTRAST TECHNIQUE: Contiguous axial images were obtained from the base of the skull through the vertex without intravenous contrast. COMPARISON:  CT brain 02/02/2017 FINDINGS: Brain: 8 mm hyperdense focus within the left posterior parietal lobe with surrounding edema. No midline shift. The ventricles are of normal size. Vascular: No hyperdense vessels. Carotid artery calcification. Numerous small vessel calcifications at the skull base. Skull: Increased bilateral mastoid effusion.  No fracture Sinuses/Orbits: Mucosal thickening in the left maxillary and ethmoid sinuses. No acute orbital abnormality Other: None IMPRESSION: Interim development of an 8 mm hyperdense focus within the left posterior parietal region with mild surrounding edema. Appearance is suspicious for small hemorrhagic mass. MRI is suggested for further evaluation.  Critical Value/emergent results were called by telephone at the time of interpretation on 06/11/2017 at 6:49 pm to Dr. Dorie Rank , who verbally acknowledged these results. Electronically Signed   By: Donavan Foil M.D.   On: 06/11/2017 18:49   Ct Pelvis Wo Contrast  Result Date: 06/11/2017 CLINICAL DATA:  [place] out of bed, questionable hip fracture EXAM: CT PELVIS WITHOUT CONTRAST TECHNIQUE: Multidetector CT imaging of the pelvis was performed following the standard protocol without intravenous contrast. COMPARISON:  Radiograph 06/11/2017, CT 04/18/2017 FINDINGS: Urinary Tract:  No abnormality visualized. Bowel:  Unremarkable visualized pelvic bowel loops. Vascular/Lymphatic: Extensive atherosclerotic calcifications. No grossly enlarged lymph nodes. Reproductive:  No mass or other significant abnormality Other:  Large volume of ascites in the pelvis.  Diffuse anasarca. Musculoskeletal: No definitive fracture is seen. Deep sacral decubitus ulcer down to the sacrococcygeal tip. Interval bone resorption and erosive change at the coccyx. IMPRESSION: 1. No definite acute pelvic fracture is seen 2. Deep sacral decubitus ulcer, down to the sacrococcygeal bone. New bony resorptive and erosive changes at the coccyx, concerning for osteomyelitis. 3. Anasarca.  Large  volume of ascites in the pelvis. Electronically Signed   By: Donavan Foil M.D.   On: 06/11/2017 23:44   US Abdomen Limited  Result Date: 05/30/2017 CLINICAL DATA:  Assess for ascites EXAM: LIMITED ABDOMEN ULTRASOUND FOR ASCITES TECHNIQUE: Limited ultrasound survey for ascites was performed in all four abdominal quadrants. COMPARISON:  04/18/2017 FINDINGS: Moderate amount of ascites is identified. The overall appearance may be slightly greater than that seen on the prior CT examination. IMPRESSION: Overall slight increase in ascites when compared with the prior CT. Electronically Signed   By: Inez Catalina M.D.   On: 05/30/2017 14:17   Dg Knee Complete 4  Views Right  Result Date: 06/11/2017 CLINICAL DATA:  Fall with knee pain EXAM: RIGHT KNEE - COMPLETE 4+ VIEW COMPARISON:  None. FINDINGS: No fracture or malalignment is seen. Mild patellofemoral degenerative changes. Extensive vascular calcifications. No large effusion. IMPRESSION: Minimal degenerative changes.  No acute osseous abnormality. Electronically Signed   By: Donavan Foil M.D.   On: 06/11/2017 18:56   Dg Hip Unilat W Or Wo Pelvis 2-3 Views Right  Result Date: 06/11/2017 CLINICAL DATA:  Fall with right hip and knee pain EXAM: DG HIP (WITH OR WITHOUT PELVIS) 2-3V RIGHT COMPARISON:  None. FINDINGS: No acute fracture or dislocation of the right hip. Extensive vascular calcifications. A lucency at the right inferior pubic ramus is noted. Pubic symphysis is intact. Extensive vascular calcification IMPRESSION: 1. No fracture or malalignment of the proximal right femur 2. Lucency over the right inferior pubic ramus, uncertain if this is related to skin fold artifact or a nondisplaced fracture. Electronically Signed   By: Donavan Foil M.D.   On: 06/11/2017 18:55     CBC Recent Labs  Lab 06/06/17 0711 06/09/17 0500 06/11/17 1757 06/12/17 0813  WBC 11.9* 9.5 9.3 8.7  HGB 7.7* 7.5* 8.9* 8.9*  HCT 26.6* 24.9* 29.7* 28.3*  PLT 305 282 281 268  MCV 92.0 90.9 91.1 90.7  MCH 26.6 27.4 27.3 28.5  MCHC 28.9* 30.1 30.0 31.4  RDW 16.6* 16.6* 16.5* 16.8*    Chemistries  Recent Labs  Lab 06/06/17 0711 06/09/17 0500 06/11/17 1757 06/12/17 0813  NA 134* 137 138 139  K 5.6* 5.7* 3.9 4.1  CL 97* 98* 96* 97*  CO2 22 22 27 28   GLUCOSE 130* 161* 132* 125*  BUN 56* 70* 29* 37*  CREATININE 5.44* 6.14* 3.77* 4.58*  CALCIUM 8.9 9.0 8.5* 8.7*  MG  --   --   --  1.7   ------------------------------------------------------------------------------------------------------------------ estimated creatinine clearance is 21 mL/min (A) (by C-G formula based on SCr of 4.58 mg/dL  (H)). ------------------------------------------------------------------------------------------------------------------ Recent Labs    06/12/17 0813  HGBA1C 5.4   ------------------------------------------------------------------------------------------------------------------ No results for input(s): CHOL, HDL, LDLCALC, TRIG, CHOLHDL, LDLDIRECT in the last 72 hours. ------------------------------------------------------------------------------------------------------------------ No results for input(s): TSH, T4TOTAL, T3FREE, THYROIDAB in the last 72 hours.  Invalid input(s): FREET3 ------------------------------------------------------------------------------------------------------------------ No results for input(s): VITAMINB12, FOLATE, FERRITIN, TIBC, IRON, RETICCTPCT in the last 72 hours.  Coagulation profile Recent Labs  Lab 06/11/17 1922  INR 1.20    No results for input(s): DDIMER in the last 72 hours.  Cardiac Enzymes No results for input(s): CKMB, TROPONINI, MYOGLOBIN in the last 168 hours.  Invalid input(s): CK ------------------------------------------------------------------------------------------------------------------ Invalid input(s): POCBNP   CBG: Recent Labs  Lab 06/12/17 0030 06/12/17 0816 06/12/17 1343  GLUCAP 139* 113* 74       Studies: Ct Head Wo Contrast  Result Date: 06/11/2017 CLINICAL DATA:  [place]  and hit head EXAM: CT HEAD WITHOUT CONTRAST TECHNIQUE: Contiguous axial images were obtained from the base of the skull through the vertex without intravenous contrast. COMPARISON:  CT brain 02/02/2017 FINDINGS: Brain: 8 mm hyperdense focus within the left posterior parietal lobe with surrounding edema. No midline shift. The ventricles are of normal size. Vascular: No hyperdense vessels. Carotid artery calcification. Numerous small vessel calcifications at the skull base. Skull: Increased bilateral mastoid effusion.  No fracture Sinuses/Orbits:  Mucosal thickening in the left maxillary and ethmoid sinuses. No acute orbital abnormality Other: None IMPRESSION: Interim development of an 8 mm hyperdense focus within the left posterior parietal region with mild surrounding edema. Appearance is suspicious for small hemorrhagic mass. MRI is suggested for further evaluation. Critical Value/emergent results were called by telephone at the time of interpretation on 06/11/2017 at 6:49 pm to Dr. Dorie Rank , who verbally acknowledged these results. Electronically Signed   By: Donavan Foil M.D.   On: 06/11/2017 18:49   Ct Pelvis Wo Contrast  Result Date: 06/11/2017 CLINICAL DATA:  [place] out of bed, questionable hip fracture EXAM: CT PELVIS WITHOUT CONTRAST TECHNIQUE: Multidetector CT imaging of the pelvis was performed following the standard protocol without intravenous contrast. COMPARISON:  Radiograph 06/11/2017, CT 04/18/2017 FINDINGS: Urinary Tract:  No abnormality visualized. Bowel:  Unremarkable visualized pelvic bowel loops. Vascular/Lymphatic: Extensive atherosclerotic calcifications. No grossly enlarged lymph nodes. Reproductive:  No mass or other significant abnormality Other:  Large volume of ascites in the pelvis.  Diffuse anasarca. Musculoskeletal: No definitive fracture is seen. Deep sacral decubitus ulcer down to the sacrococcygeal tip. Interval bone resorption and erosive change at the coccyx. IMPRESSION: 1. No definite acute pelvic fracture is seen 2. Deep sacral decubitus ulcer, down to the sacrococcygeal bone. New bony resorptive and erosive changes at the coccyx, concerning for osteomyelitis. 3. Anasarca.  Large volume of ascites in the pelvis. Electronically Signed   By: Donavan Foil M.D.   On: 06/11/2017 23:44   Dg Knee Complete 4 Views Right  Result Date: 06/11/2017 CLINICAL DATA:  Fall with knee pain EXAM: RIGHT KNEE - COMPLETE 4+ VIEW COMPARISON:  None. FINDINGS: No fracture or malalignment is seen. Mild patellofemoral degenerative  changes. Extensive vascular calcifications. No large effusion. IMPRESSION: Minimal degenerative changes.  No acute osseous abnormality. Electronically Signed   By: Donavan Foil M.D.   On: 06/11/2017 18:56   Dg Hip Unilat W Or Wo Pelvis 2-3 Views Right  Result Date: 06/11/2017 CLINICAL DATA:  Fall with right hip and knee pain EXAM: DG HIP (WITH OR WITHOUT PELVIS) 2-3V RIGHT COMPARISON:  None. FINDINGS: No acute fracture or dislocation of the right hip. Extensive vascular calcifications. A lucency at the right inferior pubic ramus is noted. Pubic symphysis is intact. Extensive vascular calcification IMPRESSION: 1. No fracture or malalignment of the proximal right femur 2. Lucency over the right inferior pubic ramus, uncertain if this is related to skin fold artifact or a nondisplaced fracture. Electronically Signed   By: Donavan Foil M.D.   On: 06/11/2017 18:55      Lab Results  Component Value Date   HGBA1C 5.4 06/12/2017   HGBA1C 9.5 (H) 03/24/2017   HGBA1C 11.2 (H) 02/15/2016   Lab Results  Component Value Date   LDLCALC 62 01/27/2012   CREATININE 4.58 (H) 06/12/2017       Scheduled Meds: . amLODipine  10 mg Oral BID  . atorvastatin  40 mg Oral q1800  . cloNIDine  0.3 mg Oral TID  .  docusate  100 mg Oral TID  . feeding supplement (PRO-STAT SUGAR FREE 64)  30 mL Oral BID  . fentaNYL  25 mcg Transdermal Q72H  . hydrALAZINE  100 mg Oral Q8H  . insulin aspart  0-9 Units Subcutaneous TID WC  . insulin glargine  14 Units Subcutaneous QHS  . insulin glargine  20 Units Subcutaneous Daily  . isosorbide dinitrate  60 mg Oral BID  . labetalol  300 mg Oral TID  . lidocaine      . metoCLOPramide  10 mg Oral TID  . multivitamin  1 tablet Oral QHS  . pantoprazole  40 mg Oral Daily  . sertraline  50 mg Oral Daily  . sevelamer carbonate  2,400 mg Oral TID WC  . sodium hypochlorite   Irrigation BID  . spironolactone  100 mg Oral Daily   Continuous Infusions: . piperacillin-tazobactam  (ZOSYN)  IV    . [START ON 06/13/2017] vancomycin       LOS: 1 day    Time spent: >30 MINS    Reyne Dumas  [facility] Pager [phone]. If 7PM-7AM, please contact night-coverage at [email or web address], password [facility] 06/12/2017, 2:44 PM  LOS: 1 day

## 2017-06-12 NOTE — Consult Note (Signed)
[facility] Renal Consultation Note    Indication for Consultation:  Management of ESRD/hemodialysis; anemia, hypertension/volume and secondary hyperparathyroidism  WJX:[id number], Ajith, MD  HPI: [name] is a 42 y.o. female. ESRD 2/2 DM on HD MWF at [facility], first starting in 07/2010.  Past medical history significant for DM with retinopathy, CAD h/o NSTEMI, grade 3 diastolic dysfunction, restrictive CM w/ EF, 50-55%, pulmonary HTN, h/o ascites; PVD s/p CFA, profunda, SFA and EIA endarterectomy on 04/16/18 by Dr. Russella Dar; cardiac arrest x2 during and post surgery; RLE TMA on 05/09/18 by Dr. Sharol Given, and h/o sacral decubitus ulcer.   Patient had prolonged hospitalization following revascularization and cardiac arrest, discharging to [facility] from [facility] on 04/28/17 and then to [facility] on 06/10/17. Of note she received her first outpatient dialysis yesterday at [place].  Dialysis was tolerated well, without complication but patient was noted to be well above EDW, leaving approx 7L over.  Ms. Michelini presented to the ED following a fall off of her bed at the nursing facility.  Seen and examined at bedside.  States she feel asleep and next thing she knew she was on the floor. Believes she rolled out of bed. Admits to hitting head, but no LOC.  Currently having hip and R foot pain. Foot pain not acute, it has worsened in the last few days. Denies SOB, chest pain, abdominal pain, n/v/d/c, dizziness, headache, weakness, cough, fever and chills.   Pertinent findings include CT head showing L parietal hyperdensity suspicious for hemorraghic mass; CT pelvis showing no definite acute pelvic fracture; sacral decubitus ulcer to the bone with new bony changes at coccyx, and Anasarca. Paracentesis resulted in removal of 9.8L of dark yellow fluid.  Patient admitted for further evaluation and management.    Past Medical History:  Diagnosis Date  . Arthritis   . CHF (congestive heart  failure) ([place])   . Chronic anemia    2nd to renal disease  . CIN III (cervical intraepithelial neoplasia grade III) with severe dysplasia    S/P LEEP AND CONE  . Coronary artery disease    Status post cardiac catheterization June 2012 scattered coronary artery disease/atherosclerosis with 70-80% stenosis in a small right PDA.  . Diabetic Charcot's joint disease ([place])   . DM (diabetes mellitus) ([place])    Long-term insulin  . End stage renal disease on dialysis [facility]) 05/02/11   "[facility]"; [facility]; M; W, F ("got it 04/15/2017 because of Thanksgiving")  . Fracture of 5th metatarsal 2016   Right  . Gastroparesis   . Heart murmur   . 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 History of abscesses in groins 12/06/2010  . Hyperlipidemia    Hypertriglyceridemia 449 HDL 25  . Hypertension   . Migraines    "just on dialysis days"  . Peripheral neuropathy    related to DM  . Peripheral vascular disease ([place])    Tibial occlusive disease evaluated by Dr. Kellie Simmering in August 2011. Medical therapy  . Renal insufficiency    Dialysis since 2012  . Seizures ([place])    "only when her sugar drops" (04/17/2017)  . Tobacco use disorder    Discontinued March 2012  . Trimalleolar fracture of ankle, closed 02/09/2015   Right   Past Surgical History:  Procedure Laterality Date  . ABDOMINAL AORTOGRAM W/LOWER EXTREMITY [name] 03/27/2017   Procedure: ABDOMINAL AORTOGRAM W/LOWER EXTREMITY;  Surgeon: Conrad Bloomingdale, MD;  Location: [place] [facility];  Service: Cardiovascular;  Laterality: [name];  .  AMPUTATION Right 05/09/2017   Procedure: AMPUTATION TRANSMETATARSAL OF RIGHT FOOT;  Surgeon: Serafina Mitchell, MD;  Location: [place];  Service: Vascular;  Laterality: Right;  . AV FISTULA PLACEMENT Left 04/2010   "lower arm"  . CARDIAC CATHETERIZATION [name] 02/10/2015   Procedure: Left Heart Cath and Coronary Angiography;  Surgeon: Dixie Dials, MD;  Location: [place] [facility];  Service: Cardiovascular;  Laterality: [name];  . CARDIAC  CATHETERIZATION [name] 02/21/2016   Procedure: Right Heart Cath;  Surgeon: Jolaine Artist, MD;  Location: [place] [facility];  Service: Cardiovascular;  Laterality: [name];  . CATARACT EXTRACTION W/ INTRAOCULAR LENS  IMPLANT, BILATERAL Bilateral   . CERVICAL BIOPSY  W/ LOOP ELECTRODE EXCISION     h/o  . CERVICAL CONE BIOPSY     h/o  . COLPOSCOPY    . DILATION AND CURETTAGE OF UTERUS  2009  . ENDARTERECTOMY FEMORAL Right 04/16/2017   Procedure: RIGHT FEMORAL ENDARTERECTOMY;  Surgeon: Serafina Mitchell, MD;  Location: [place];  Service: Vascular;  Laterality: Right;  . FRACTURE SURGERY    . HERNIA REPAIR    . INCISION AND DRAINAGE ABSCESS     "groin"  . INSERTION OF DIALYSIS CATHETER Left 04/16/2017   Procedure: INSERTION of temporay DIALYSIS CATHETER, left femoral artery;  Surgeon: Serafina Mitchell, MD;  Location: [place];  Service: Vascular;  Laterality: Left;  . IR HYBRID TRAUMA EMBOLIZATION  04/16/2017  . ORIF ANKLE FRACTURE Right 02/16/2015   Procedure: OPEN REDUCTION INTERNAL FIXATION (ORIF) RIGHT ANKLE FRACTURE;  Surgeon: Altamese Surprise, MD;  Location: [place];  Service: Orthopedics;  Laterality: Right;  . PATCH ANGIOPLASTY Right 04/16/2017   Procedure: PATCH ANGIOPLASTY OF RIGHT FEMORAL ARTERY USING BOVINE PERICARDIUM PATCH;  Surgeon: Serafina Mitchell, MD;  Location: [facility];  Service: Vascular;  Laterality: Right;  . UMBILICAL HERNIA REPAIR  X 2  . WOUND EXPLORATION Right 05/09/2017   Procedure: EXPLORATION RIGHT GROIN;  Surgeon: Serafina Mitchell, MD;  Location: [facility];  Service: Vascular;  Laterality: Right;   Family History  Problem Relation Age of Onset  . Diabetes Mother   . Hypertension Mother   . Diabetes Father   . Hyperlipidemia Father   . Hypertension Father   . Diabetes Sister   . Stroke Maternal Grandmother   . Diabetes Sister   . Breast cancer Maternal Aunt        Age 52's   Social History:  reports that she quit smoking about 5 years ago. Her smoking use included  cigarettes. She has a 3.00 pack-year smoking history. she has never used smokeless tobacco. She reports that she does not drink alcohol or use drugs. Allergies  Allergen Reactions  . Cephalexin Other (See Comments)    Reaction unknown- Childhood allergy Tolerated Ceftriaxone in the past  . Sulfamethoxazole-Trimethoprim Other (See Comments)    Unknown reaction. Pt states that she was told by her mother that she had allergy to Bactrim as a child.   Prior to Admission medications   Medication Sig Start Date End Date Taking? Authorizing Provider  Amino Acids-Protein Hydrolys (FEEDING SUPPLEMENT, PRO-STAT SUGAR FREE 64,) LIQD Take 30 mLs by mouth 2 (two) times daily.   Yes [provider]  amLODipine (NORVASC) 10 MG tablet Take 10 mg by mouth 2 (two) times daily.    Yes [provider]  aspirin EC 81 MG EC tablet Take 1 tablet (81 mg total) by mouth daily. 03/30/17  Yes Alma Friendly, MD  atorvastatin (  LIPITOR) 40 MG tablet Take 40 mg by mouth daily at 6 PM.    Yes [provider]  b complex-vitamin c-folic acid (NEPHRO-VITE) 0.8 MG TABS tablet Take 1 tablet by mouth daily.   Yes [provider]  cloNIDine (CATAPRES) 0.3 MG tablet Take 0.3 mg by mouth 3 (three) times daily. 03/14/16  Yes [provider]  docusate (COLACE) 50 MG/5ML liquid Take 100 mg by mouth 3 (three) times daily.   Yes [provider]  fentaNYL (DURAGESIC - DOSED MCG/HR) 25 MCG/HR patch Place 25 mcg onto the skin every 3 (three) days.   Yes [provider]  hydrALAZINE (APRESOLINE) 100 MG tablet Take 1 tablet (100 mg total) by mouth every 8 (eight) hours. 02/24/16  Yes Tat, Shanon Brow, MD  Insulin Glargine (LANTUS SOLOSTAR) 100 UNIT/ML Solostar Pen Inject 14 Units into the skin daily at 10 pm.    Yes [provider]  Insulin Glargine (LANTUS SOLOSTAR) 100 UNIT/ML Solostar Pen Inject 20 Units into the skin every morning.   Yes [provider]   isosorbide dinitrate (ISORDIL) 30 MG tablet Take 60 mg by mouth 2 (two) times daily. Give two 30 mg tablets to = 60 mg BID   Yes [provider]  labetalol (NORMODYNE) 300 MG tablet Take 1 tablet (300 mg total) by mouth 3 (three) times daily. 11/01/14  Yes Debbe Odea, MD  LORazepam (ATIVAN) 1 MG tablet Take 1 mg by mouth every 6 (six) hours as needed for anxiety.   Yes [provider]  metoCLOPramide (REGLAN) 10 MG tablet Take 10 mg by mouth 3 (three) times daily.   Yes [provider]  ondansetron (ZOFRAN) 4 MG tablet Take 4 mg by mouth every 6 (six) hours as needed for nausea or vomiting.   Yes [provider]  oxyCODONE (OXY IR/ROXICODONE) 5 MG immediate release tablet Take 5 mg by mouth every 6 (six) hours as needed for severe pain.   Yes [provider]  pantoprazole (PROTONIX) 40 MG tablet Take 40 mg by mouth daily.   Yes [provider]  sertraline (ZOLOFT) 50 MG tablet Take 50 mg by mouth daily.   Yes [provider]  sevelamer carbonate (RENVELA) 800 MG tablet Take 2,400 mg by mouth 3 (three) times daily with meals. Give 3 tablets to = 2400 mg TID   Yes [provider]  spironolactone (ALDACTONE) 100 MG tablet Take 100 mg daily by mouth. 02/19/17  Yes [provider]  Darbepoetin Alfa (ARANESP, ALBUMIN FREE,) 200 MCG/ML SOLN Inject 200 mg as directed. Give at dialysis on Wednesday    [provider]  multivitamin (RENA-VIT) TABS tablet Take 1 tablet by mouth at bedtime. 03/29/17   Alma Friendly, MD   Current Facility-Administered Medications  Medication Dose Route Frequency Provider Last Rate Last Dose  . acetaminophen (TYLENOL) tablet 650 mg  650 mg Oral Q6H PRN Rise Patience, MD       Or  . acetaminophen (TYLENOL) suppository 650 mg  650 mg Rectal Q6H PRN Rise Patience, MD      . amLODipine (NORVASC) tablet 10 mg  10 mg Oral BID Rise Patience, MD   10 mg at 06/12/17 1010   . atorvastatin (LIPITOR) tablet 40 mg  40 mg Oral q1800 Rise Patience, MD      . cloNIDine (CATAPRES) tablet 0.3 mg  0.3 mg Oral TID Rise Patience, MD   0.3 mg at 06/12/17 1010  .  docusate (COLACE) 50 MG/5ML liquid 100 mg  100 mg Oral TID Rise Patience, MD   100 mg at 06/12/17 1010  . feeding supplement (PRO-STAT SUGAR FREE 64) liquid 30 mL  30 mL Oral BID Rise Patience, MD   30 mL at 06/12/17 1015  . fentaNYL (DURAGESIC - dosed mcg/hr) patch 25 mcg  25 mcg Transdermal Q72H Rise Patience, MD   25 mcg at 06/11/17 2356  . hydrALAZINE (APRESOLINE) injection 10 mg  10 mg Intravenous Q4H PRN Rise Patience, MD      . hydrALAZINE (APRESOLINE) tablet 100 mg  100 mg Oral Q8H Rise Patience, MD   100 mg at 06/12/17 1345  . insulin aspart (novoLOG) injection 0-9 Units  0-9 Units Subcutaneous TID WC Rise Patience, MD      . insulin glargine (LANTUS) injection 14 Units  14 Units Subcutaneous QHS Rise Patience, MD      . insulin glargine (LANTUS) injection 20 Units  20 Units Subcutaneous Daily Rise Patience, MD   20 Units at 06/12/17 1009  . isosorbide dinitrate (ISORDIL) tablet 60 mg  60 mg Oral BID Rise Patience, MD   60 mg at 06/12/17 1018  . labetalol (NORMODYNE) tablet 300 mg  300 mg Oral TID Rise Patience, MD   300 mg at 06/12/17 1011  . lidocaine (XYLOCAINE) 2 % injection   Infiltration PRN Ascencion Dike, PA-C   10 mL at 06/12/17 1227  . lidocaine 20 MG/ML injection           . LORazepam (ATIVAN) tablet 1 mg  1 mg Oral Q6H PRN Rise Patience, MD      . metoCLOPramide (REGLAN) tablet 10 mg  10 mg Oral TID Rise Patience, MD   10 mg at 06/12/17 1010  . multivitamin (RENA-VIT) tablet 1 tablet  1 tablet Oral QHS Rise Patience, MD   1 tablet at 06/11/17 2243  . ondansetron (ZOFRAN) tablet 4 mg  4 mg Oral Q6H PRN Rise Patience, MD       Or  . ondansetron [facility]) injection 4 mg  4 mg Intravenous Q6H  PRN Rise Patience, MD      . oxyCODONE (Oxy IR/ROXICODONE) immediate release tablet 5 mg  5 mg Oral Q6H PRN Rise Patience, MD   5 mg at 06/12/17 1328  . pantoprazole (PROTONIX) EC tablet 40 mg  40 mg Oral Daily Rise Patience, MD   40 mg at 06/12/17 1011  . piperacillin-tazobactam (ZOSYN) IVPB 3.375 g  3.375 g Intravenous Q12H Wynell Balloon, RPH      . sertraline (ZOLOFT) tablet 50 mg  50 mg Oral Daily Rise Patience, MD   50 mg at 06/12/17 1010  . sevelamer carbonate (RENVELA) tablet 2,400 mg  2,400 mg Oral TID WC Rise Patience, MD   2,400 mg at 06/12/17 1344  . sodium hypochlorite (DAKIN'S 1/4 STRENGTH) topical solution   Irrigation BID Abrol, Ascencion Dike, MD      . spironolactone (ALDACTONE) tablet 100 mg  100 mg Oral Daily Rise Patience, MD   100 mg at 06/12/17 1010  . [START ON 06/13/2017] vancomycin (VANCOCIN) IVPB 1000 mg/200 mL premix  1,000 mg Intravenous Q M,W,F-HD Wynell Balloon, [facility]       Current Outpatient Medications  Medication Sig Dispense Refill  . Amino Acids-Protein Hydrolys (FEEDING SUPPLEMENT, PRO-STAT SUGAR FREE 64,) LIQD Take 30 mLs by mouth 2 (two)  times daily.    Marland Kitchen amLODipine (NORVASC) 10 MG tablet Take 10 mg by mouth 2 (two) times daily.     Marland Kitchen aspirin EC 81 MG EC tablet Take 1 tablet (81 mg total) by mouth daily. 30 tablet 0  . atorvastatin (LIPITOR) 40 MG tablet Take 40 mg by mouth daily at 6 PM.     . b complex-vitamin c-folic acid (NEPHRO-VITE) 0.8 MG TABS tablet Take 1 tablet by mouth daily.    . cloNIDine (CATAPRES) 0.3 MG tablet Take 0.3 mg by mouth 3 (three) times daily.    Marland Kitchen docusate (COLACE) 50 MG/5ML liquid Take 100 mg by mouth 3 (three) times daily.    . fentaNYL (DURAGESIC - DOSED MCG/HR) 25 MCG/HR patch Place 25 mcg onto the skin every 3 (three) days.    . hydrALAZINE (APRESOLINE) 100 MG tablet Take 1 tablet (100 mg total) by mouth every 8 (eight) hours. 90 tablet 1  . Insulin Glargine (LANTUS SOLOSTAR) 100 UNIT/ML  Solostar Pen Inject 14 Units into the skin daily at 10 pm.     . Insulin Glargine (LANTUS SOLOSTAR) 100 UNIT/ML Solostar Pen Inject 20 Units into the skin every morning.    . isosorbide dinitrate (ISORDIL) 30 MG tablet Take 60 mg by mouth 2 (two) times daily. Give two 30 mg tablets to = 60 mg BID    . labetalol (NORMODYNE) 300 MG tablet Take 1 tablet (300 mg total) by mouth 3 (three) times daily. 270 tablet 3  . LORazepam (ATIVAN) 1 MG tablet Take 1 mg by mouth every 6 (six) hours as needed for anxiety.    . metoCLOPramide (REGLAN) 10 MG tablet Take 10 mg by mouth 3 (three) times daily.    . ondansetron (ZOFRAN) 4 MG tablet Take 4 mg by mouth every 6 (six) hours as needed for nausea or vomiting.    Marland Kitchen oxyCODONE (OXY IR/ROXICODONE) 5 MG immediate release tablet Take 5 mg by mouth every 6 (six) hours as needed for severe pain.    . pantoprazole (PROTONIX) 40 MG tablet Take 40 mg by mouth daily.    . sertraline (ZOLOFT) 50 MG tablet Take 50 mg by mouth daily.    . sevelamer carbonate (RENVELA) 800 MG tablet Take 2,400 mg by mouth 3 (three) times daily with meals. Give 3 tablets to = 2400 mg TID    . spironolactone (ALDACTONE) 100 MG tablet Take 100 mg daily by mouth.  11  . Darbepoetin Alfa (ARANESP, ALBUMIN FREE,) 200 MCG/ML SOLN Inject 200 mg as directed. Give at dialysis on Wednesday    . multivitamin (RENA-VIT) TABS tablet Take 1 tablet by mouth at bedtime. 30 tablet 0   Labs: Basic Metabolic Panel: Recent Labs  Lab 06/06/17 0711 06/09/17 0500 06/11/17 1757 06/12/17 0813  NA 134* 137 138 139  K 5.6* 5.7* 3.9 4.1  CL 97* 98* 96* 97*  CO2 22 22 27 28   GLUCOSE 130* 161* 132* 125*  BUN 56* 70* 29* 37*  CREATININE 5.44* 6.14* 3.77* 4.58*  CALCIUM 8.9 9.0 8.5* 8.7*  PHOS 3.3 4.0  --   --    Liver Function Tests: Recent Labs  Lab 06/06/17 0711 06/09/17 0500  ALBUMIN 1.7* 1.6*   CBC: Recent Labs  Lab 06/06/17 0711 06/09/17 0500 06/11/17 1757 06/12/17 0813  WBC 11.9* 9.5 9.3 8.7   HGB 7.7* 7.5* 8.9* 8.9*  HCT 26.6* 24.9* 29.7* 28.3*  MCV 92.0 90.9 91.1 90.7  PLT 305 282 281 268  Recent Labs  Lab 06/12/17 0030 06/12/17 0816 06/12/17 1343  GLUCAP 139* 113* 74   Iron Studies: No results for input(s): IRON, TIBC, TRANSFERRIN, FERRITIN in the last 72 hours. Studies/Results: Ct Head Wo Contrast  Result Date: 06/11/2017 CLINICAL DATA:  [place] and hit head EXAM: CT HEAD WITHOUT CONTRAST TECHNIQUE: Contiguous axial images were obtained from the base of the skull through the vertex without intravenous contrast. COMPARISON:  CT brain 02/02/2017 FINDINGS: Brain: 8 mm hyperdense focus within the left posterior parietal lobe with surrounding edema. No midline shift. The ventricles are of normal size. Vascular: No hyperdense vessels. Carotid artery calcification. Numerous small vessel calcifications at the skull base. Skull: Increased bilateral mastoid effusion.  No fracture Sinuses/Orbits: Mucosal thickening in the left maxillary and ethmoid sinuses. No acute orbital abnormality Other: None IMPRESSION: Interim development of an 8 mm hyperdense focus within the left posterior parietal region with mild surrounding edema. Appearance is suspicious for small hemorrhagic mass. MRI is suggested for further evaluation. Critical Value/emergent results were called by telephone at the time of interpretation on 06/11/2017 at 6:49 pm to Dr. Dorie Rank , who verbally acknowledged these results. Electronically Signed   By: Donavan Foil M.D.   On: 06/11/2017 18:49   Ct Pelvis Wo Contrast  Result Date: 06/11/2017 CLINICAL DATA:  [place] out of bed, questionable hip fracture EXAM: CT PELVIS WITHOUT CONTRAST TECHNIQUE: Multidetector CT imaging of the pelvis was performed following the standard protocol without intravenous contrast. COMPARISON:  Radiograph 06/11/2017, CT 04/18/2017 FINDINGS: Urinary Tract:  No abnormality visualized. Bowel:  Unremarkable visualized pelvic bowel loops. Vascular/Lymphatic:  Extensive atherosclerotic calcifications. No grossly enlarged lymph nodes. Reproductive:  No mass or other significant abnormality Other:  Large volume of ascites in the pelvis.  Diffuse anasarca. Musculoskeletal: No definitive fracture is seen. Deep sacral decubitus ulcer down to the sacrococcygeal tip. Interval bone resorption and erosive change at the coccyx. IMPRESSION: 1. No definite acute pelvic fracture is seen 2. Deep sacral decubitus ulcer, down to the sacrococcygeal bone. New bony resorptive and erosive changes at the coccyx, concerning for osteomyelitis. 3. Anasarca.  Large volume of ascites in the pelvis. Electronically Signed   By: Donavan Foil M.D.   On: 06/11/2017 23:44   Dg Knee Complete 4 Views Right  Result Date: 06/11/2017 CLINICAL DATA:  Fall with knee pain EXAM: RIGHT KNEE - COMPLETE 4+ VIEW COMPARISON:  None. FINDINGS: No fracture or malalignment is seen. Mild patellofemoral degenerative changes. Extensive vascular calcifications. No large effusion. IMPRESSION: Minimal degenerative changes.  No acute osseous abnormality. Electronically Signed   By: Donavan Foil M.D.   On: 06/11/2017 18:56   Dg Hip Unilat W Or Wo Pelvis 2-3 Views Right  Result Date: 06/11/2017 CLINICAL DATA:  Fall with right hip and knee pain EXAM: DG HIP (WITH OR WITHOUT PELVIS) 2-3V RIGHT COMPARISON:  None. FINDINGS: No acute fracture or dislocation of the right hip. Extensive vascular calcifications. A lucency at the right inferior pubic ramus is noted. Pubic symphysis is intact. Extensive vascular calcification IMPRESSION: 1. No fracture or malalignment of the proximal right femur 2. Lucency over the right inferior pubic ramus, uncertain if this is related to skin fold artifact or a nondisplaced fracture. Electronically Signed   By: Donavan Foil M.D.   On: 06/11/2017 18:55    ROS: All others negative except those listed in HPI.  Physical Exam: Vitals:   06/12/17 1311 06/12/17 1315 06/12/17 1330 06/12/17  1345  BP: (!) 159/53 (!) 162/58  Marland Kitchen)  160/66  Pulse:   74 74  Resp:   20   Temp:      TempSrc:      SpO2:   94% 96%  Weight:      Height:         General: WDWN, NAD, chronically ill appearing Head: NCAT, sclera not icteric, MMM Neck: Supple. No lymphadenopathy. No JVD Lungs: CTA bilaterally. No wheeze, rales or rhonchi. Breathing is unlabored. Heart: RRR. No rubs or gallops.  Abdomen: soft, nontender, +BS, no guarding, no rebound tenderness.  Lower extremities: RLE: metatarsal amputation, distal end dusky, +eschar, +pain, LLE: trace-1+edema, +vascular changes  Neuro: A&Ox3. Moves all extremities spontaneously. Psych:  Responds to questions appropriately with a normal affect. Ischial decubitus ulcer -dressed, c/d/i Dialysis Access: LU AVF +b/t  Dialysis Orders:  MWF - [facility]  4.25 hrs, BFR 400, DFR 800,  EDW 85.5kg, 2 K/ 2.25 Ca, Profile 2  Access:  LU AVF  Heparin 2000 Unit bolus  mircera 223mcg IV q 2wks - supposed to start 06/12/17  Assessment/Plan: 1. L parietal lobe Mass - [place] out of bed. 32mm hyper density seen on CT - suspected hemorrage. Neuro consulted, states asymptomatic at this time. MRI ordered. 2. Ascites - s/p RLQ paracentesis 9.8L hazy, dark yellow fluid removed. Sent for culture. 3. Sacral Decubitus Ulcer/multiple pressure sores/?Osteomyelitis - CT shows bony changes at coccyx concerning for possible osteomyelitis. Wound care consulted. ABX ordered - Zosyn & Vanc. BC ordered. Per primary. 4. ESRD -  Received full HD treatment yesterday, 1st one since d/c from [facility] 06/07/17, 4.7L removed, left 7kg over EDW. No acute reason for HD today, plan for HD tomorrow. NO HEPARIN. 5.  Hypertension/volume  - BP variable. On amlodipine. Titrate down volume as tolerated. After paracentesis likely under EDW. Need hoyer or standing weight for better accuracy. Does not appear volume overloaded on exam. 6.  Anemia  - Hgb 8.9, add 150 mcg Aranesp IV qwk starting tomorrow.  7.  Secondary  Hyperparathyroidism -  Ca 8.7, Need P. Not on VDRA. Continue binder.  8.  Nutrition - Renal diet with fluid restriction. Nepro TID.  9. DM - per primary 10. CHF - per primary 11. CAD s/p cath - per primary  Jen Mow, PA-C [facility] Pager: [phone] 06/12/2017, 2:43 PM   I have seen and examined this patient and agree with plan and assessment in the above note with renal recommendations/intervention highlighted.  Pt with marked volume overload after being at [facility] and now sp almost 10 liters removed via paracentesis.  Will plan for HD tomorrow without heparin (due to her [place]) and follow daily weights and I's/O's.  Ascites is likely due to her pulmonary HTN but also not adequate UF while she was at [facility].  She feels well despite the fall and ICH.  She also has significant issues with her decubitus ulcers and transmet wound.  WOC following and there is a concern for osteo of her coccyx.  Continue with abx per primary. Governor Rooks Najiyah Paris,MD 06/12/2017 4:32 PM

## 2017-06-12 NOTE — ED Notes (Signed)
Patient remains in IR

## 2017-06-12 NOTE — ED Notes (Signed)
Patient has a decub.  On her buttocks that has a dressing on it however is draining. Linens changed.

## 2017-06-12 NOTE — ED Notes (Signed)
PA at bedside for wound care

## 2017-06-12 NOTE — Consult Note (Addendum)
[place] Nurse wound consult note Reason for Consult: sacral ulcer, however it is noted on review of the chart patient also underwent a right transmetatarsal amputation on 05/09/17 per VVS also. She current has open groin wound that is draining heavily and is non viable in the base.   Wound type: Full thickness wound right groin, surgical Full thickness wound right transmet site Deep tissue injury right heel/achilles area Stage 4 pressure injury left ischium Arterial ulceration left 3rd toe  Pressure Injury POA: Yes Measurement: Right groin; 6cm x 2cm x unknow (not able to measure depth today) Right transmet site incision closed with minimal skin separation but with skin necrosis and eschar that is 10cm x 5cm x 0cm with extension of color changes an additional 4 cm on the plantar surface Right achilles: 2cm x 2.5cm x 0cm Left 3rd toe: 0.2cm 0.2cm 0cm  Left ischium: 8cm x 4cm x 3.5cm   Wound bed: Right groin: 75% yellow/25% pale, moist, with foul odor of serosanguinous heavy drainage, most likely lymphatic fluid as well. Right transmet site, see above 100% eschar with extension of darkening of the skin circumferentially; no drainage  Right achilles: non blanchable dark purple, intact skin; no drainage Left 3rd toe: 100% eschar; no drainage  Left ischium: HEAVY thick yellow/green drainage, no significant odor. 90% pale, non granular tissue, 10% yellow slough at proximal wound base.    Drainage (amount, consistency, odor) see above  Periwound: intact, left ischial wound has some maceration of the wound edges, most likely related to the amount of drainage.  Dressing procedure/placement/frequency: Will need ortho and/or ID consultation/evaluation for possibilityof osteomyelitis, this is considered outside the scope of practice for the [facility] nurse.   I have written topical care for chemical debridement agent due to the presence of the thick, purulent drainage.  Will await further evaluations, and  change wound care if needed at that time. 1/4% Dakins solution moistened gauze BID.  Will place the patient on low air loss mattress for moisture management and pressure redistribution upon arrival to permanent inpatient bed, she is able to turn herself off the left side currently.   No topical care ordered for the right foot/surgical site at this time, will await vascular evaluation. Dry dressing to the right groin till vascular evaluation. She reports plans for NPWT, however currently with the level of necrotic tissue would not be appropriate and I have explained that to her this am.  Silicone foam to the right achilles for protection from further injury.  May need offloading boot (Prevalon) will hold off until after vascular evaluation.  Not topical wound care for the left toe, leave open to air and monitor for changes.   Notified hospitalist [id number] of my findings and recommendations.   [place] Nurse team will follow along with you for weekly wound assessments.  Please notify me of any acute changes in the wounds or any new areas of concerns Monette MSN, RN,CWOCN, [place], [place]

## 2017-06-12 NOTE — Consult Note (Signed)
Hospital Consult    Reason for Consult:  Right groin wound and non healing foot wound Requesting Physician:  Dr. Allyson Sabal MRN #:  [id number]  History of Present Illness: This is a 42 y.o. female who underwent a right CFA, profunda, SFA and external iliac endarterectomy with bovine patch angioplasty on 04/16/17 and during that time, she became unstable and received CPR & was transferred to the ICU.  She subsequently was neuro in tact and pressors weaned.  She eventually had a tracheostomy. A JP drain was placed during surgery and was left for quite some time due to increased drainage.  The was eventually removed.  She did have healing issues in her right groin and needed extensive wound care.  Once she became more stable, she underwent a right TMA and exploration of her groin with evacuation of seroma and wound vac placement on 05/09/17.    She had gone to [facility] and was discharged from there on January 15th.  She has been weaned from the trach and is now on room air.  She tells me that she had a wound vac placed in [facility] and states her groin wound got much better.  It was removed at discharge and was supposed to be put back on at [facility], but she had a fall and was readmitted back to the hospital yesterday.  She states that she continues to have a lot of drainage from the groin and has to change her gown & linens often.  She tells me that her right foot looks worse.    She was brought to the ER yesterday after falling at the nursing facility & hitting her head.  She did not loose consciousness.   In the ER, she had a head CT, which showed a hyperdense area concerning for hemorrhagic mass and neurosurgery was consulted and an MRI was ordered.  She has been asymptomatic with regards to mass.    In the ER, she was also found to have ascites likely from fluid overload and underwent a paracentesis today with removal of 9.8L of fluid.  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  ID will be consulted.  She has ESRD and dialyzes on M/W/F.    Past Medical History:  Diagnosis Date  . Arthritis   . CHF (congestive heart failure) ([place])   . Chronic anemia    2nd to renal disease  . CIN III (cervical intraepithelial neoplasia grade III) with severe dysplasia    S/P LEEP AND CONE  . Coronary artery disease    Status post cardiac catheterization June 2012 scattered coronary artery disease/atherosclerosis with 70-80% stenosis in a small right PDA.  . Diabetic Charcot's joint disease ([place])   . DM (diabetes mellitus) ([place])    Long-term insulin  . End stage renal disease on dialysis [facility]) 05/02/11   "[facility]"; [facility]; M; W, F ("got it 04/15/2017 because of Thanksgiving")  . Fracture of 5th metatarsal 2016   Right  . Gastroparesis   . Heart murmur   . 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 History of abscesses in groins 12/06/2010  . Hyperlipidemia    Hypertriglyceridemia 449 HDL 25  . Hypertension   . Migraines    "just on dialysis days"  . Peripheral neuropathy    related to DM  . Peripheral vascular disease ([place])    Tibial occlusive disease evaluated by Dr. Kellie Simmering in August 2011. Medical therapy  . Renal insufficiency    Dialysis since  2012  . Seizures ([place])    "only when her sugar drops" (04/17/2017)  . Tobacco use disorder    Discontinued March 2012  . Trimalleolar fracture of ankle, closed 02/09/2015   Right    Past Surgical History:  Procedure Laterality Date  . ABDOMINAL AORTOGRAM W/LOWER EXTREMITY [name] 03/27/2017   Procedure: ABDOMINAL AORTOGRAM W/LOWER EXTREMITY;  Surgeon: Conrad Oceana, MD;  Location: [facility] CV [facility];  Service: Cardiovascular;  Laterality: [name];  . AMPUTATION Right 05/09/2017   Procedure: AMPUTATION TRANSMETATARSAL OF RIGHT FOOT;  Surgeon: Serafina Mitchell, MD;  Location: [facility];  Service: Vascular;  Laterality: Right;  . AV FISTULA PLACEMENT Left 04/2010   "lower arm"  . CARDIAC CATHETERIZATION [name] 02/10/2015   Procedure:  Left Heart Cath and Coronary Angiography;  Surgeon: Dixie Dials, MD;  Location: [place] [facility];  Service: Cardiovascular;  Laterality: [name];  . CARDIAC CATHETERIZATION [name] 02/21/2016   Procedure: Right Heart Cath;  Surgeon: Jolaine Artist, MD;  Location: [place] [facility];  Service: Cardiovascular;  Laterality: [name];  . CATARACT EXTRACTION W/ INTRAOCULAR LENS  IMPLANT, BILATERAL Bilateral   . CERVICAL BIOPSY  W/ LOOP ELECTRODE EXCISION     h/o  . CERVICAL CONE BIOPSY     h/o  . COLPOSCOPY    . DILATION AND CURETTAGE OF UTERUS  2009  . ENDARTERECTOMY FEMORAL Right 04/16/2017   Procedure: RIGHT FEMORAL ENDARTERECTOMY;  Surgeon: Serafina Mitchell, MD;  Location: [facility];  Service: Vascular;  Laterality: Right;  . FRACTURE SURGERY    . HERNIA REPAIR    . INCISION AND DRAINAGE ABSCESS     "groin"  . INSERTION OF DIALYSIS CATHETER Left 04/16/2017   Procedure: INSERTION of temporay DIALYSIS CATHETER, left femoral artery;  Surgeon: Serafina Mitchell, MD;  Location: [facility];  Service: Vascular;  Laterality: Left;  . IR HYBRID TRAUMA EMBOLIZATION  04/16/2017  . ORIF ANKLE FRACTURE Right 02/16/2015   Procedure: OPEN REDUCTION INTERNAL FIXATION (ORIF) RIGHT ANKLE FRACTURE;  Surgeon: Altamese Alvarado, MD;  Location: [place];  Service: Orthopedics;  Laterality: Right;  . PATCH ANGIOPLASTY Right 04/16/2017   Procedure: PATCH ANGIOPLASTY OF RIGHT FEMORAL ARTERY USING BOVINE PERICARDIUM PATCH;  Surgeon: Serafina Mitchell, MD;  Location[facility];  Service: Vascular;  Laterality: Right;  . UMBILICAL HERNIA REPAIR  X 2  . WOUND EXPLORATION Right 05/09/2017   Procedure: EXPLORATION RIGHT GROIN;  Surgeon: Serafina Mitchell, MD;  Location: [facility];  Service: Vascular;  Laterality: Right;    Allergies  Allergen Reactions  . Cephalexin Other (See Comments)    Reaction unknown- Childhood allergy Tolerated Ceftriaxone in the past  . Sulfamethoxazole-Trimethoprim Other (See Comments)    Unknown reaction. Pt states that  she was told by her mother that she had allergy to Bactrim as a child.    Prior to Admission medications   Medication Sig Start Date End Date Taking? Authorizing Provider  Amino Acids-Protein Hydrolys (FEEDING SUPPLEMENT, PRO-STAT SUGAR FREE 64,) LIQD Take 30 mLs by mouth 2 (two) times daily.   Yes [provider]  amLODipine (NORVASC) 10 MG tablet Take 10 mg by mouth 2 (two) times daily.    Yes [provider]  aspirin EC 81 MG EC tablet Take 1 tablet (81 mg total) by mouth daily. 03/30/17  Yes Alma Friendly, MD  atorvastatin (LIPITOR) 40 MG tablet Take 40 mg by mouth daily at 6 PM.    Yes [provider]  b complex-vitamin c-folic  acid (NEPHRO-VITE) 0.8 MG TABS tablet Take 1 tablet by mouth daily.   Yes [provider]  cloNIDine (CATAPRES) 0.3 MG tablet Take 0.3 mg by mouth 3 (three) times daily. 03/14/16  Yes [provider]  docusate (COLACE) 50 MG/5ML liquid Take 100 mg by mouth 3 (three) times daily.   Yes [provider]  fentaNYL (DURAGESIC - DOSED MCG/HR) 25 MCG/HR patch Place 25 mcg onto the skin every 3 (three) days.   Yes [provider]  hydrALAZINE (APRESOLINE) 100 MG tablet Take 1 tablet (100 mg total) by mouth every 8 (eight) hours. 02/24/16  Yes Tat, Shanon Brow, MD  Insulin Glargine (LANTUS SOLOSTAR) 100 UNIT/ML Solostar Pen Inject 14 Units into the skin daily at 10 pm.    Yes [provider]  Insulin Glargine (LANTUS SOLOSTAR) 100 UNIT/ML Solostar Pen Inject 20 Units into the skin every morning.   Yes [provider]  isosorbide dinitrate (ISORDIL) 30 MG tablet Take 60 mg by mouth 2 (two) times daily. Give two 30 mg tablets to = 60 mg BID   Yes [provider]  labetalol (NORMODYNE) 300 MG tablet Take 1 tablet (300 mg total) by mouth 3 (three) times daily. 11/01/14  Yes Debbe Odea, MD  LORazepam (ATIVAN) 1 MG tablet Take 1 mg by mouth every 6 (six) hours as needed for anxiety.   Yes  [provider]  metoCLOPramide (REGLAN) 10 MG tablet Take 10 mg by mouth 3 (three) times daily.   Yes [provider]  ondansetron (ZOFRAN) 4 MG tablet Take 4 mg by mouth every 6 (six) hours as needed for nausea or vomiting.   Yes [provider]  oxyCODONE (OXY IR/ROXICODONE) 5 MG immediate release tablet Take 5 mg by mouth every 6 (six) hours as needed for severe pain.   Yes [provider]  pantoprazole (PROTONIX) 40 MG tablet Take 40 mg by mouth daily.   Yes [provider]  sertraline (ZOLOFT) 50 MG tablet Take 50 mg by mouth daily.   Yes [provider]  sevelamer carbonate (RENVELA) 800 MG tablet Take 2,400 mg by mouth 3 (three) times daily with meals. Give 3 tablets to = 2400 mg TID   Yes [provider]  spironolactone (ALDACTONE) 100 MG tablet Take 100 mg daily by mouth. 02/19/17  Yes [provider]  Darbepoetin Alfa (ARANESP, ALBUMIN FREE,) 200 MCG/ML SOLN Inject 200 mg as directed. Give at dialysis on Wednesday    [provider]  multivitamin (RENA-VIT) TABS tablet Take 1 tablet by mouth at bedtime. 03/29/17   Alma Friendly, MD    Social History   Socioeconomic History  . Marital status: Married    Spouse name: Not on file  . Number of children: Not on file  . Years of education: Not on file  . Highest education level: Not on file  Social Needs  . Financial resource strain: Not on file  . Food insecurity - worry: Not on file  . Food insecurity - inability: Not on file  . Transportation needs - medical: Not on file  . Transportation needs - non-medical: Not on file  Occupational History    Employer: [employer]  Tobacco Use  . Smoking status: Former Smoker    Packs/day: 0.30    Years: 10.00    Pack years: 3.00    Types: Cigarettes    Last attempt to quit: 08/05/2011    Years since quitting: 5.8  . Smokeless tobacco: Never Used  Substance and Sexual Activity  . Alcohol use: No  . Drug  use: No  . Sexual activity: Not on file  Other Topics Concern  . Not on file  Social History Narrative  . Not on file     Family History  Problem Relation Age of Onset  . Diabetes Mother   . Hypertension Mother   . Diabetes Father   . Hyperlipidemia Father   . Hypertension Father   . Diabetes Sister   . Stroke Maternal Grandmother   . Diabetes Sister   . Breast cancer Maternal Aunt        Age 67's    ROS:  See HPI  Physical Examination  Vitals:   06/12/17 1400 06/12/17 1430  BP: (!) 151/67 (!) 160/69  Pulse: 73 73  Resp:    Temp:    SpO2: 93% 97%   Body mass index is 24.34 kg/m.  General:  thin in NAD Gait: Not observed HENT: WNL, normocephalic Pulmonary: normal non-labored breathing Skin: without rashes Vascular Exam/Pulses:  Right groin-mildly odorous  Extremities:   Right foot dorsum  Right foot plantar  Musculoskeletal: no muscle wasting or atrophy  Neurologic: A&O X 3;  No focal weakness or paresthesias are detected; speech is fluent/normal Psychiatric:  The pt has Normal affect.   CBC    Component Value Date/Time   WBC 8.7 06/12/2017 0813   RBC 3.12 (L) 06/12/2017 0813   HGB 8.9 (L) 06/12/2017 0813   HCT 28.3 (L) 06/12/2017 0813   PLT 268 06/12/2017 0813   MCV 90.7 06/12/2017 0813   MCH 28.5 06/12/2017 0813   MCHC 31.4 06/12/2017 0813   RDW 16.8 (H) 06/12/2017 0813   LYMPHSABS 0.9 05/13/2017 0746   MONOABS 1.0 05/13/2017 0746   EOSABS 0.3 05/13/2017 0746   BASOSABS 0.0 05/13/2017 0746    BMET    Component Value Date/Time   NA 139 06/12/2017 0813   K 4.1 06/12/2017 0813   CL 97 (L) 06/12/2017 0813   CO2 28 06/12/2017 0813   GLUCOSE 125 (H) 06/12/2017 0813   BUN 37 (H) 06/12/2017 0813   CREATININE 4.58 (H) 06/12/2017 0813   CALCIUM 8.7 (L) 06/12/2017 0813   GFRNONAA 11 (L) 06/12/2017 0813   GFRAA 13 (L) 06/12/2017 0813    COAGS: Lab Results  Component Value Date   INR 1.20 06/11/2017   INR 1.19 05/09/2017   INR 1.12  05/09/2017    ASSESSMENT/PLAN: This is a 42 y.o. female who had right CFA, profunda, SFA and external iliac endarterectomy with bovine patch angioplasty on 40/08/67 complicated by CPR, right TMA and right groin exploration on 05/09/17 and extended hospital LTACH stay who presents back to the hospital after dc to SNF with fall.  VVS is consulted to check right groin and TMA.   -pt's right groin has improved significantly since I saw it 2-3 weeks ago.  There is also less odor associated with this wound.  She tells me that it seemed to get better with a wound vac but this was not put back in place at the SNF bc she fell before it could get placed.   Will do dry to dry dressing to right groin given the amount of drainage, which is most likely lymphatic.  Dr. Bridgett Larsson will be by to see pt today. -the right TMA wound has progressed and it is possible that she will need a more proximal amputation.   -Dr. Trula Slade will be back tomorrow and will inspect the wound  and make decision about wound vac and right foot.  Please keep the right foot floated off of the bed  -dry dressing to right groin bid and as needed until decision is made about the wound vac.  -right foot dressed with kerlix  [name], PA-C [facility] [phone]  Addendum  I have independently interviewed and examined the patient, and I agree with the physician assistant's findings.  R groin continues to drain copious amounts of serous fluid consistent with seroma.  Mild amount of necrotic fat in R groin wound.  R TMA is non-viable.  - Santyl to R groin BID with dry dressing BID and prn - Once R groin wound cleans up, would go back to [facility] - Pt will need R BKA vs AKA iff medically stable - Dr. Trula Slade will be by tomorrow - No immediate surgical needs.   Adele Barthel, MD, FACS [facility] Office: [phone] Pager: [phone]  06/12/2017, 5:21 PM

## 2017-06-13 DIAGNOSIS — N189 Chronic kidney disease, unspecified: Secondary | ICD-10-CM | POA: Insufficient documentation

## 2017-06-13 LAB — COMPREHENSIVE METABOLIC PANEL
ALK PHOS: 81 U/L (ref 38–126)
ALT: 11 U/L — AB (ref 14–54)
AST: 13 U/L — ABNORMAL LOW (ref 15–41)
Albumin: 1.8 g/dL — ABNORMAL LOW (ref 3.5–5.0)
Anion gap: 15 (ref 5–15)
BILIRUBIN TOTAL: 0.8 mg/dL (ref 0.3–1.2)
BUN: 48 mg/dL — ABNORMAL HIGH (ref 6–20)
CALCIUM: 8.2 mg/dL — AB (ref 8.9–10.3)
CO2: 26 mmol/L (ref 22–32)
CREATININE: 5.56 mg/dL — AB (ref 0.44–1.00)
Chloride: 96 mmol/L — ABNORMAL LOW (ref 101–111)
GFR calc non Af Amer: 9 mL/min — ABNORMAL LOW (ref >60)
GFR, EST AFRICAN AMERICAN: 10 mL/min — AB (ref >60)
GLUCOSE: 213 mg/dL — AB (ref 65–99)
Potassium: 4.1 mmol/L (ref 3.5–5.1)
Sodium: 137 mmol/L (ref 135–145)
TOTAL PROTEIN: 5.2 g/dL — AB (ref 6.5–8.1)

## 2017-06-13 LAB — CBC
HCT: 23.4 % — ABNORMAL LOW (ref 36.0–46.0)
Hemoglobin: 7.3 g/dL — ABNORMAL LOW (ref 12.0–15.0)
MCH: 28.3 pg (ref 26.0–34.0)
MCHC: 31.2 g/dL (ref 30.0–36.0)
MCV: 90.7 fL (ref 78.0–100.0)
PLATELETS: 214 10*3/uL (ref 150–400)
RBC: 2.58 MIL/uL — AB (ref 3.87–5.11)
RDW: 16.6 % — ABNORMAL HIGH (ref 11.5–15.5)
WBC: 8.5 10*3/uL (ref 4.0–10.5)

## 2017-06-13 LAB — GLUCOSE, CAPILLARY
GLUCOSE-CAPILLARY: 154 mg/dL — AB (ref 65–99)
GLUCOSE-CAPILLARY: 139 mg/dL — AB (ref 65–99)
Glucose-Capillary: 207 mg/dL — ABNORMAL HIGH (ref 65–99)
Glucose-Capillary: 254 mg/dL — ABNORMAL HIGH (ref 65–99)

## 2017-06-13 LAB — MRSA PCR SCREENING: MRSA by PCR: NEGATIVE

## 2017-06-13 LAB — PROCALCITONIN: PROCALCITONIN: 0.7 ng/mL

## 2017-06-13 LAB — PHOSPHORUS: PHOSPHORUS: 5.3 mg/dL — AB (ref 2.5–4.6)

## 2017-06-13 MED ORDER — CLONIDINE HCL 0.3 MG PO TABS
0.3000 mg | ORAL_TABLET | Freq: Three times a day (TID) | ORAL | Status: DC
  Administered 2017-06-13 – 2017-06-23 (×24): 0.3 mg via ORAL
  Filled 2017-06-13 (×24): qty 1

## 2017-06-13 MED ORDER — VANCOMYCIN HCL IN DEXTROSE 1-5 GM/200ML-% IV SOLN
INTRAVENOUS | Status: AC
  Filled 2017-06-13: qty 200

## 2017-06-13 MED ORDER — DARBEPOETIN ALFA 200 MCG/0.4ML IJ SOSY
PREFILLED_SYRINGE | INTRAMUSCULAR | Status: AC
  Filled 2017-06-13: qty 0.4

## 2017-06-13 MED ORDER — FENTANYL CITRATE (PF) 100 MCG/2ML IJ SOLN
12.5000 ug | Freq: Four times a day (QID) | INTRAMUSCULAR | Status: DC | PRN
  Administered 2017-06-13: 12.5 ug via INTRAVENOUS
  Filled 2017-06-13: qty 2

## 2017-06-13 MED ORDER — METOCLOPRAMIDE HCL 5 MG PO TABS
2.5000 mg | ORAL_TABLET | Freq: Three times a day (TID) | ORAL | Status: DC
  Administered 2017-06-13 – 2017-06-23 (×21): 2.5 mg via ORAL
  Filled 2017-06-13 (×12): qty 1
  Filled 2017-06-13: qty 0.5
  Filled 2017-06-13 (×9): qty 1
  Filled 2017-06-13: qty 0.5
  Filled 2017-06-13: qty 1

## 2017-06-13 MED ORDER — MAGNESIUM SULFATE 2 GM/50ML IV SOLN
2.0000 g | Freq: Once | INTRAVENOUS | Status: AC
  Administered 2017-06-13: 2 g via INTRAVENOUS
  Filled 2017-06-13: qty 50

## 2017-06-13 MED ORDER — DARBEPOETIN ALFA 150 MCG/0.3ML IJ SOSY
PREFILLED_SYRINGE | INTRAMUSCULAR | Status: AC
  Filled 2017-06-13: qty 0.3

## 2017-06-13 MED ORDER — DARBEPOETIN ALFA 200 MCG/0.4ML IJ SOSY
200.0000 ug | PREFILLED_SYRINGE | INTRAMUSCULAR | Status: DC
  Administered 2017-06-13 – 2017-06-20 (×2): 200 ug via INTRAVENOUS
  Filled 2017-06-13: qty 0.4

## 2017-06-13 MED ORDER — MORPHINE SULFATE (PF) 2 MG/ML IV SOLN
2.0000 mg | Freq: Four times a day (QID) | INTRAVENOUS | Status: DC | PRN
  Administered 2017-06-13 – 2017-06-17 (×11): 2 mg via INTRAVENOUS
  Filled 2017-06-13 (×11): qty 1

## 2017-06-13 NOTE — Procedures (Signed)
I was present at this session.  I have reviewed the session itself and made appropriate changes.  HD via [facility]LA AVF, will ^ vol off, bp is ^. Needs serial HD Jeneen Rinks Arrianna Catala 1/18/201911:12 AM

## 2017-06-13 NOTE — Progress Notes (Signed)
[facility] Progress Note   Subjective:  Seen in room. Denies CP or dyspnea. C/o foot pain. For HD later today.  Objective Vitals:   06/12/17 1615 06/12/17 1754 06/12/17 2146 06/13/17 0439  BP: (!) 169/53 (!) 165/68 (!) 146/59 (!) 154/58  Pulse: 73 76 75 81  Resp:  20 19 19   Temp:  98.4 F (36.9 C) 98.4 F (36.9 C) 98.2 F (36.8 C)  TempSrc:  Oral Oral Oral  SpO2: 97% 98% 95% 97%  Weight:  88.5 kg (195 lb 1.7 oz) 88.6 kg (195 lb 5.2 oz)   Height:  6\' 4"  (1.93 m)     Physical Exam General ILL appearing, NAD Heart: RRR Gr2/6 M Lungs: CTAB decreased bs rales in bases Abdm pos FW, distended Extremities: No LE edema, bandaged R foot Dialysis Access: AVF + thrill  Additional Objective Labs: Basic Metabolic Panel: Recent Labs  Lab 06/09/17 0500 06/11/17 1757 06/12/17 0813  NA 137 138 139  K 5.7* 3.9 4.1  CL 98* 96* 97*  CO2 22 27 28   GLUCOSE 161* 132* 125*  BUN 70* 29* 37*  CREATININE 6.14* 3.77* 4.58*  CALCIUM 9.0 8.5* 8.7*  PHOS 4.0  --   --    Liver Function Tests: Recent Labs  Lab 06/09/17 0500  ALBUMIN 1.6*   CBC: Recent Labs  Lab 06/09/17 0500 06/11/17 1757 06/12/17 0813  WBC 9.5 9.3 8.7  HGB 7.5* 8.9* 8.9*  HCT 24.9* 29.7* 28.3*  MCV 90.9 91.1 90.7  PLT 282 281 268   CBG: Recent Labs  Lab 06/12/17 0816 06/12/17 1343 06/12/17 1713 06/12/17 2143 06/13/17 0823  GLUCAP 113* 74 98 80 207*   Studies/Results: Ct Head Wo Contrast  Result Date: 06/11/2017 CLINICAL DATA:  [place] and hit head EXAM: CT HEAD WITHOUT CONTRAST TECHNIQUE: Contiguous axial images were obtained from the base of the skull through the vertex without intravenous contrast. COMPARISON:  CT brain 02/02/2017 FINDINGS: Brain: 8 mm hyperdense focus within the left posterior parietal lobe with surrounding edema. No midline shift. The ventricles are of normal size. Vascular: No hyperdense vessels. Carotid artery calcification. Numerous small vessel calcifications at the  skull base. Skull: Increased bilateral mastoid effusion.  No fracture Sinuses/Orbits: Mucosal thickening in the left maxillary and ethmoid sinuses. No acute orbital abnormality Other: None IMPRESSION: Interim development of an 8 mm hyperdense focus within the left posterior parietal region with mild surrounding edema. Appearance is suspicious for small hemorrhagic mass. MRI is suggested for further evaluation. Critical Value/emergent results were called by telephone at the time of interpretation on 06/11/2017 at 6:49 pm to Dr. Dorie Rank , who verbally acknowledged these results. Electronically Signed   By: Donavan Foil M.D.   On: 06/11/2017 18:49   Ct Pelvis Wo Contrast  Result Date: 06/11/2017 CLINICAL DATA:  [place] out of bed, questionable hip fracture EXAM: CT PELVIS WITHOUT CONTRAST TECHNIQUE: Multidetector CT imaging of the pelvis was performed following the standard protocol without intravenous contrast. COMPARISON:  Radiograph 06/11/2017, CT 04/18/2017 FINDINGS: Urinary Tract:  No abnormality visualized. Bowel:  Unremarkable visualized pelvic bowel loops. Vascular/Lymphatic: Extensive atherosclerotic calcifications. No grossly enlarged lymph nodes. Reproductive:  No mass or other significant abnormality Other:  Large volume of ascites in the pelvis.  Diffuse anasarca. Musculoskeletal: No definitive fracture is seen. Deep sacral decubitus ulcer down to the sacrococcygeal tip. Interval bone resorption and erosive change at the coccyx. IMPRESSION: 1. No definite acute pelvic fracture is seen 2. Deep sacral decubitus ulcer, down to  the sacrococcygeal bone. New bony resorptive and erosive changes at the coccyx, concerning for osteomyelitis. 3. Anasarca.  Large volume of ascites in the pelvis. Electronically Signed   By: Donavan Foil M.D.   On: 06/11/2017 23:44   Dg Knee Complete 4 Views Right  Result Date: 06/11/2017 CLINICAL DATA:  Fall with knee pain EXAM: RIGHT KNEE - COMPLETE 4+ VIEW COMPARISON:  None.  FINDINGS: No fracture or malalignment is seen. Mild patellofemoral degenerative changes. Extensive vascular calcifications. No large effusion. IMPRESSION: Minimal degenerative changes.  No acute osseous abnormality. Electronically Signed   By: Donavan Foil M.D.   On: 06/11/2017 18:56   Dg Hip Unilat W Or Wo Pelvis 2-3 Views Right  Result Date: 06/11/2017 CLINICAL DATA:  Fall with right hip and knee pain EXAM: DG HIP (WITH OR WITHOUT PELVIS) 2-3V RIGHT COMPARISON:  None. FINDINGS: No acute fracture or dislocation of the right hip. Extensive vascular calcifications. A lucency at the right inferior pubic ramus is noted. Pubic symphysis is intact. Extensive vascular calcification IMPRESSION: 1. No fracture or malalignment of the proximal right femur 2. Lucency over the right inferior pubic ramus, uncertain if this is related to skin fold artifact or a nondisplaced fracture. Electronically Signed   By: Donavan Foil M.D.   On: 06/11/2017 18:55   Ir Paracentesis  Result Date: 06/12/2017 INDICATION: Abdominal distention secondary to ascites. Request for diagnostic and therapeutic paracentesis. EXAM: ULTRASOUND GUIDED RIGHT LOWER QUADRANT PARACENTESIS MEDICATIONS: None. COMPLICATIONS: None immediate. PROCEDURE: Informed written consent was obtained from the patient after a discussion of the risks, benefits and alternatives to treatment. A timeout was performed prior to the initiation of the procedure. Initial ultrasound scanning demonstrates a large amount of ascites within the right lower abdominal quadrant. The right lower abdomen was prepped and draped in the usual sterile fashion. 1% lidocaine with epinephrine was used for local anesthesia. Following this, a 19 gauge, 10-cm, Yueh catheter was introduced. An ultrasound image was saved for documentation purposes. The paracentesis was performed. The catheter was removed and a dressing was applied. The patient tolerated the procedure well without immediate post  procedural complication. FINDINGS: A total of approximately 9.8 L of hazy, dark yellow fluid was removed. Samples were sent to the laboratory as requested by the clinical team. IMPRESSION: Successful ultrasound-guided paracentesis yielding 9.8 liters of peritoneal fluid. Read by: Ascencion Dike PA-C Electronically Signed   By: Aletta Edouard M.D.   On: 06/12/2017 13:25   Medications: . magnesium sulfate 1 - 4 g bolus IVPB    . piperacillin-tazobactam (ZOSYN)  IV Stopped (06/12/17 1751)  . vancomycin     . amLODipine  10 mg Oral BID  . atorvastatin  40 mg Oral q1800  . darbepoetin (ARANESP) injection - DIALYSIS  150 mcg Intravenous Q Fri-HD  . docusate  100 mg Oral TID  . feeding supplement (PRO-STAT SUGAR FREE 64)  30 mL Oral BID  . fentaNYL  25 mcg Transdermal Q72H  . insulin aspart  0-9 Units Subcutaneous TID WC  . insulin glargine  14 Units Subcutaneous QHS  . insulin glargine  20 Units Subcutaneous Daily  . isosorbide dinitrate  60 mg Oral BID  . labetalol  300 mg Oral TID  . metoCLOPramide  10 mg Oral TID  . multivitamin  1 tablet Oral QHS  . pantoprazole  40 mg Oral Daily  . sertraline  50 mg Oral Daily  . sevelamer carbonate  2,400 mg Oral TID WC  . sodium hypochlorite  Irrigation BID  . spironolactone  100 mg Oral Daily    Dialysis Orders: MWF - [facility] 4.25 hrs, BFR 400, DFR 800,  EDW 85.5kg, 2 K/ 2.25 Ca, Profile 2, L AVF  - Heparin 2000 Unit bolus  - Mircera 240mcg IV q 2wks - supposed to start 06/12/17  Assessment/Plan: 1. L parietal lobe Mass - [place] out of bed. 58mm hyper density seen on CT - suspected hemorrage. Neuro consulted, states asymptomatic at this time. MRI ordered. 2. Ascites - s/p RLQ paracentesis 9.8L hazy, dark yellow fluid removed. Cx pending. Needs large amt to dry  wgt lowering. Not sure how got in this bad of shape 3. Sacral Decubitus Ulcer/multiple pressure sores/?Osteomyelitis - CT shows bony changes at coccyx concerning for possible osteomyelitis.  Wound care consulted. ABX ordered - Zosyn & Vanc. BCx pending. Per primary. 4. ESRD: Will resume HD per MWF schedule, next today. NO HEPARIN.Do serial HD to lower vol. 5.  Hypertension/volume: BP variable, high side. S/p nearly 10L paracentesis, now much closer to prior EDW. Will try to get a standing weight if possible for better accuracy.  needs less meds, much lower dry 6.  Anemia  - Hgb 8.9, starting Aranesp 176mcg weekly. 7.  Secondary Hyperparathyroidism : Labs ok. Not on VDRA. Continue binder.  8.  Nutrition - Renal diet with fluid restriction. Nepro TID.  9. DM - per primary 10. CHF - per primary 11. CAD s/p cath - per primary   Veneta Penton, PA-C 06/13/2017, 9:02 AM  [facility] Pager: [phone] I have seen and examined this patient and agree with the plan of care seen, eval, examined, discussed with extender. Will so serial HD, lower dry.   Jeneen Rinks Mattea Seger 06/13/2017, 11:10 AM

## 2017-06-13 NOTE — Progress Notes (Addendum)
[facility] PROGRESS NOTE  [name]:[id number] DOB: February 12, 1976 DOA: 06/11/2017   PCP: Merrilee Seashore, MD   [name] is a 42 y.o. female with history of ESRD on hemodialysis Monday Wednesday Friday recent cardiac arrest in April 16, 2017 and eventually had ventilator dependent respiratory failure and was discharged to long-term acute care and was recently discharged and tracheostomy removed was brought to the ER after patient had a fall at her living facility. In the ER CT of the head done shows 8 mm hyperdense concerning for hemorrhagic mass in the left parietal area.  On-call neurosurgeon Dr. Christella Noa was consulted and requested MRI brain.  X-ray of the pelvis was showing possibility of pelvic fracture of the skin fold.  CT pelvis was ordered which shows no acute fracture. On exam patient also has significantly increased ascites, s/p  paracentesis.  Patient unable to lie flat because of the ascites and sacral decubitus ulcers and because of which MRI was unable to be done.  Patient seen and examined at dialysis with no family members present. No complaints of pain at dialysis until transferred to the floor. Now complaints of pain in her necrotic foot and sacral decubitus ulcer 10/10. Fentanyl patch in place. Given fentanyl dose IV. Careful in opioids administration in the setting of ESRD.    Assessment/Plan: Active Problems:   End stage renal disease on dialysis [facility])   PAD (peripheral artery disease) (HCC)   CAD (coronary artery disease)   Ascites   Fluid overload   Hypertension   Brain mass   Fall   Assessment and plan Fall with head CT showing hemorrhagic mass  - 47mm hyperdensity in left parietal lobe on CT with edema.  -No real mass effect.She is not an operative candidate.  appreciate neurosurgery consult.   -MRI brain pending for further evaluation of patient's hemorrhagic mass- patient unable to lie flat for MRI  Ascites s/p paracentesis  with 9.8L removed (06/12/17) -likely from fluid overload -s/p  paracentesis   -With removal of 9.8L. Labs .  -No signs of SBP -will give albumin due to large volume paracentesis   Sacral decubitus ulcer with CT scan showing possibility of osteomyelitis/drainage from right groin at the site where angiogram was performed- -empiric abx .  -Vascular surgery consulted.  -BC no growth 1 day x2 -WOC following   ESRD on hemodialysis on Monday Wednesday and Friday  -has had dialysis today 06/13/17. -Nephrology following  Hypertension uncontrolled  -patient is on amlodipine, labetalol spironolactone Isordil.  -resume clonidine 06/13/17 -can resume hydralazine po if uncontrolled HTN persists -Patient also on PRN IV hydralazine.  Anemia likely from ESRD  -follow CBC.   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  Chronic HFREF 30-35% (TEE 04/16/17) -not in exacerbation -HD for fluid removal -continue home meds -strict I&O's -daily weight -fluid restriction   DVT prophylaxsis: sq lovenox   Code Status:  Full    Family Communication: none at bedside  Disposition Plan:    Will stay another midnight to continue IV antibiotics    Consultants:  Vascular   Procedures:   none   Antibiotics: Anti-infectives (From admission, onward)   Start     Dose/Rate Route Frequency Ordered Stop   06/13/17 1200  vancomycin (VANCOCIN) IVPB 1000 mg/200 mL premix  1,000 mg 200 mL/hr over 60 Minutes Intravenous Every M-W-F (Hemodialysis) 06/12/17 1052     06/12/17 1100  vancomycin (VANCOCIN) 2,000 mg in sodium chloride 0.9 % 500 mL IVPB     2,000 mg 250 mL/hr over 120 Minutes Intravenous  Once 06/12/17 1052 06/12/17 1526   06/12/17 1100  piperacillin-tazobactam (ZOSYN) IVPB 3.375 g      3.375 g 12.5 mL/hr over 240 Minutes Intravenous Every 12 hours 06/12/17 1052           Objective: Vitals:   06/12/17 1615 06/12/17 1754 06/12/17 2146 06/13/17 0439  BP: (!) 169/53 (!) 165/68 (!) 146/59 (!) 154/58  Pulse: 73 76 75 81  Resp:  20 19 19   Temp:  98.4 F (36.9 C) 98.4 F (36.9 C) 98.2 F (36.8 C)  TempSrc:  Oral Oral Oral  SpO2: 97% 98% 95% 97%  Weight:  88.5 kg (195 lb 1.7 oz) 88.6 kg (195 lb 5.2 oz)   Height:  6\' 4"  (1.93 m)      Intake/Output Summary (Last 24 hours) at 06/13/2017 0745 Last data filed at 06/13/2017 0600 Gross per 24 hour  Intake 910 ml  Output 0 ml  Net 910 ml    Exam:  Examination:  General exam: Appears calm and comfortable  Respiratory system: Clear to auscultation. Respiratory effort normal. Cardiovascular system: S1 & S2 heard, RRR. No JVD, murmurs, rubs, gallops or clicks. No pedal edema. Gastrointestinal system: Abdomen is nondistended, soft and nontender. No organomegaly or masses felt. Normal bowel sounds heard. Drainage from the right groin Central nervous system: Alert and oriented. No focal neurological deficits. Extremities:  Right foot metatarsal amputation with gangrenous stump Skin: Transmetatarsal amputation of the right foot Psychiatry: Judgement and insight appear normal. Mood & affect appropriate.    Data Reviewed: I have personally reviewed following labs and imaging studies  Micro Results Recent Results (from the past 240 hour(s))  MRSA PCR Screening     Status: None   Collection Time: 06/12/17 10:45 AM  Result Value Ref Range Status   MRSA by PCR NEGATIVE NEGATIVE Final    Comment:        The GeneXpert MRSA Assay (FDA approved for NASAL specimens only), is one component of a comprehensive MRSA colonization surveillance program. It is not intended to diagnose MRSA infection nor to guide or monitor treatment for MRSA infections.   Gram stain     Status: None   Collection Time: 06/12/17  1:19 PM  Result  Value Ref Range Status   Specimen Description FLUID  Final   Special Requests NONE  Final   Gram Stain NO WBC SEEN NO ORGANISMS SEEN   Final   Report Status 06/12/2017 FINAL  Final    Radiology Reports Ct Head Wo Contrast  Result Date: 06/11/2017 CLINICAL DATA:  [place] and hit head EXAM: CT HEAD WITHOUT CONTRAST TECHNIQUE: Contiguous axial images were obtained from the base of the skull through the vertex without intravenous contrast. COMPARISON:  CT brain 02/02/2017 FINDINGS: Brain: 8 mm hyperdense focus within the left posterior parietal lobe with surrounding edema. No midline shift. The ventricles are of normal size. Vascular: No hyperdense vessels. Carotid artery calcification. Numerous small vessel calcifications at the skull base. Skull: Increased bilateral mastoid effusion.  No fracture Sinuses/Orbits: Mucosal thickening in the left maxillary and ethmoid sinuses. No acute orbital abnormality Other: None IMPRESSION: Interim development of an 8 mm hyperdense focus within the left posterior parietal region with mild surrounding edema. Appearance is suspicious  for small hemorrhagic mass. MRI is suggested for further evaluation. Critical Value/emergent results were called by telephone at the time of interpretation on 06/11/2017 at 6:49 pm to Dr. Dorie Rank , who verbally acknowledged these results. Electronically Signed   By: Donavan Foil M.D.   On: 06/11/2017 18:49   Ct Pelvis Wo Contrast  Result Date: 06/11/2017 CLINICAL DATA:  [place] out of bed, questionable hip fracture EXAM: CT PELVIS WITHOUT CONTRAST TECHNIQUE: Multidetector CT imaging of the pelvis was performed following the standard protocol without intravenous contrast. COMPARISON:  Radiograph 06/11/2017, CT 04/18/2017 FINDINGS: Urinary Tract:  No abnormality visualized. Bowel:  Unremarkable visualized pelvic bowel loops. Vascular/Lymphatic: Extensive atherosclerotic calcifications. No grossly enlarged lymph nodes. Reproductive:  No mass or  other significant abnormality Other:  Large volume of ascites in the pelvis.  Diffuse anasarca. Musculoskeletal: No definitive fracture is seen. Deep sacral decubitus ulcer down to the sacrococcygeal tip. Interval bone resorption and erosive change at the coccyx. IMPRESSION: 1. No definite acute pelvic fracture is seen 2. Deep sacral decubitus ulcer, down to the sacrococcygeal bone. New bony resorptive and erosive changes at the coccyx, concerning for osteomyelitis. 3. Anasarca.  Large volume of ascites in the pelvis. Electronically Signed   By: Donavan Foil M.D.   On: 06/11/2017 23:44   US Abdomen Limited  Result Date: 05/30/2017 CLINICAL DATA:  Assess for ascites EXAM: LIMITED ABDOMEN ULTRASOUND FOR ASCITES TECHNIQUE: Limited ultrasound survey for ascites was performed in all four abdominal quadrants. COMPARISON:  04/18/2017 FINDINGS: Moderate amount of ascites is identified. The overall appearance may be slightly greater than that seen on the prior CT examination. IMPRESSION: Overall slight increase in ascites when compared with the prior CT. Electronically Signed   By: Inez Catalina M.D.   On: 05/30/2017 14:17   Dg Knee Complete 4 Views Right  Result Date: 06/11/2017 CLINICAL DATA:  Fall with knee pain EXAM: RIGHT KNEE - COMPLETE 4+ VIEW COMPARISON:  None. FINDINGS: No fracture or malalignment is seen. Mild patellofemoral degenerative changes. Extensive vascular calcifications. No large effusion. IMPRESSION: Minimal degenerative changes.  No acute osseous abnormality. Electronically Signed   By: Donavan Foil M.D.   On: 06/11/2017 18:56   Dg Hip Unilat W Or Wo Pelvis 2-3 Views Right  Result Date: 06/11/2017 CLINICAL DATA:  Fall with right hip and knee pain EXAM: DG HIP (WITH OR WITHOUT PELVIS) 2-3V RIGHT COMPARISON:  None. FINDINGS: No acute fracture or dislocation of the right hip. Extensive vascular calcifications. A lucency at the right inferior pubic ramus is noted. Pubic symphysis is intact.  Extensive vascular calcification IMPRESSION: 1. No fracture or malalignment of the proximal right femur 2. Lucency over the right inferior pubic ramus, uncertain if this is related to skin fold artifact or a nondisplaced fracture. Electronically Signed   By: Donavan Foil M.D.   On: 06/11/2017 18:55   Ir Paracentesis  Result Date: 06/12/2017 INDICATION: Abdominal distention secondary to ascites. Request for diagnostic and therapeutic paracentesis. EXAM: ULTRASOUND GUIDED RIGHT LOWER QUADRANT PARACENTESIS MEDICATIONS: None. COMPLICATIONS: None immediate. PROCEDURE: Informed written consent was obtained from the patient after a discussion of the risks, benefits and alternatives to treatment. A timeout was performed prior to the initiation of the procedure. Initial ultrasound scanning demonstrates a large amount of ascites within the right lower abdominal quadrant. The right lower abdomen was prepped and draped in the usual sterile fashion. 1% lidocaine with epinephrine was used for local anesthesia. Following this, a 19 gauge, 10-cm, Yueh catheter was introduced.  An ultrasound image was saved for documentation purposes. The paracentesis was performed. The catheter was removed and a dressing was applied. The patient tolerated the procedure well without immediate post procedural complication. FINDINGS: A total of approximately 9.8 L of hazy, dark yellow fluid was removed. Samples were sent to the laboratory as requested by the clinical team. IMPRESSION: Successful ultrasound-guided paracentesis yielding 9.8 liters of peritoneal fluid. Read by: Ascencion Dike PA-C Electronically Signed   By: Aletta Edouard M.D.   On: 06/12/2017 13:25     CBC Recent Labs  Lab 06/09/17 0500 06/11/17 1757 06/12/17 0813  WBC 9.5 9.3 8.7  HGB 7.5* 8.9* 8.9*  HCT 24.9* 29.7* 28.3*  PLT 282 281 268  MCV 90.9 91.1 90.7  MCH 27.4 27.3 28.5  MCHC 30.1 30.0 31.4  RDW 16.6* 16.5* 16.8*    Chemistries  Recent Labs  Lab  06/09/17 0500 06/11/17 1757 06/12/17 0813  NA 137 138 139  K 5.7* 3.9 4.1  CL 98* 96* 97*  CO2 22 27 28   GLUCOSE 161* 132* 125*  BUN 70* 29* 37*  CREATININE 6.14* 3.77* 4.58*  CALCIUM 9.0 8.5* 8.7*  MG  --   --  1.7   ------------------------------------------------------------------------------------------------------------------ estimated creatinine clearance is 21 mL/min (A) (by C-G formula based on SCr of 4.58 mg/dL (H)). ------------------------------------------------------------------------------------------------------------------ Recent Labs    06/12/17 0813  HGBA1C 5.4   ------------------------------------------------------------------------------------------------------------------ No results for input(s): CHOL, HDL, LDLCALC, TRIG, CHOLHDL, LDLDIRECT in the last 72 hours. ------------------------------------------------------------------------------------------------------------------ No results for input(s): TSH, T4TOTAL, T3FREE, THYROIDAB in the last 72 hours.  Invalid input(s): FREET3 ------------------------------------------------------------------------------------------------------------------ No results for input(s): VITAMINB12, FOLATE, FERRITIN, TIBC, IRON, RETICCTPCT in the last 72 hours.  Coagulation profile Recent Labs  Lab 06/11/17 1922  INR 1.20    No results for input(s): DDIMER in the last 72 hours.  Cardiac Enzymes No results for input(s): CKMB, TROPONINI, MYOGLOBIN in the last 168 hours.  Invalid input(s): CK ------------------------------------------------------------------------------------------------------------------ Invalid input(s): POCBNP   CBG: Recent Labs  Lab 06/12/17 0030 06/12/17 0816 06/12/17 1343 06/12/17 1713 06/12/17 2143  GLUCAP 139* 113* 74 98 80       Studies: Ct Head Wo Contrast  Result Date: 06/11/2017 CLINICAL DATA:  [place] and hit head EXAM: CT HEAD WITHOUT CONTRAST TECHNIQUE: Contiguous axial images  were obtained from the base of the skull through the vertex without intravenous contrast. COMPARISON:  CT brain 02/02/2017 FINDINGS: Brain: 8 mm hyperdense focus within the left posterior parietal lobe with surrounding edema. No midline shift. The ventricles are of normal size. Vascular: No hyperdense vessels. Carotid artery calcification. Numerous small vessel calcifications at the skull base. Skull: Increased bilateral mastoid effusion.  No fracture Sinuses/Orbits: Mucosal thickening in the left maxillary and ethmoid sinuses. No acute orbital abnormality Other: None IMPRESSION: Interim development of an 8 mm hyperdense focus within the left posterior parietal region with mild surrounding edema. Appearance is suspicious for small hemorrhagic mass. MRI is suggested for further evaluation. Critical Value/emergent results were called by telephone at the time of interpretation on 06/11/2017 at 6:49 pm to Dr. Dorie Rank , who verbally acknowledged these results. Electronically Signed   By: Donavan Foil M.D.   On: 06/11/2017 18:49   Ct Pelvis Wo Contrast  Result Date: 06/11/2017 CLINICAL DATA:  [place] out of bed, questionable hip fracture EXAM: CT PELVIS WITHOUT CONTRAST TECHNIQUE: Multidetector CT imaging of the pelvis was performed following the standard protocol without intravenous contrast. COMPARISON:  Radiograph 06/11/2017, CT 04/18/2017 FINDINGS: Urinary Tract:  No abnormality visualized. Bowel:  Unremarkable visualized pelvic bowel loops. Vascular/Lymphatic: Extensive atherosclerotic calcifications. No grossly enlarged lymph nodes. Reproductive:  No mass or other significant abnormality Other:  Large volume of ascites in the pelvis.  Diffuse anasarca. Musculoskeletal: No definitive fracture is seen. Deep sacral decubitus ulcer down to the sacrococcygeal tip. Interval bone resorption and erosive change at the coccyx. IMPRESSION: 1. No definite acute pelvic fracture is seen 2. Deep sacral decubitus ulcer, down to  the sacrococcygeal bone. New bony resorptive and erosive changes at the coccyx, concerning for osteomyelitis. 3. Anasarca.  Large volume of ascites in the pelvis. Electronically Signed   By: Donavan Foil M.D.   On: 06/11/2017 23:44   Dg Knee Complete 4 Views Right  Result Date: 06/11/2017 CLINICAL DATA:  Fall with knee pain EXAM: RIGHT KNEE - COMPLETE 4+ VIEW COMPARISON:  None. FINDINGS: No fracture or malalignment is seen. Mild patellofemoral degenerative changes. Extensive vascular calcifications. No large effusion. IMPRESSION: Minimal degenerative changes.  No acute osseous abnormality. Electronically Signed   By: Donavan Foil M.D.   On: 06/11/2017 18:56   Dg Hip Unilat W Or Wo Pelvis 2-3 Views Right  Result Date: 06/11/2017 CLINICAL DATA:  Fall with right hip and knee pain EXAM: DG HIP (WITH OR WITHOUT PELVIS) 2-3V RIGHT COMPARISON:  None. FINDINGS: No acute fracture or dislocation of the right hip. Extensive vascular calcifications. A lucency at the right inferior pubic ramus is noted. Pubic symphysis is intact. Extensive vascular calcification IMPRESSION: 1. No fracture or malalignment of the proximal right femur 2. Lucency over the right inferior pubic ramus, uncertain if this is related to skin fold artifact or a nondisplaced fracture. Electronically Signed   By: Donavan Foil M.D.   On: 06/11/2017 18:55   Ir Paracentesis  Result Date: 06/12/2017 INDICATION: Abdominal distention secondary to ascites. Request for diagnostic and therapeutic paracentesis. EXAM: ULTRASOUND GUIDED RIGHT LOWER QUADRANT PARACENTESIS MEDICATIONS: None. COMPLICATIONS: None immediate. PROCEDURE: Informed written consent was obtained from the patient after a discussion of the risks, benefits and alternatives to treatment. A timeout was performed prior to the initiation of the procedure. Initial ultrasound scanning demonstrates a large amount of ascites within the right lower abdominal quadrant. The right lower abdomen was  prepped and draped in the usual sterile fashion. 1% lidocaine with epinephrine was used for local anesthesia. Following this, a 19 gauge, 10-cm, Yueh catheter was introduced. An ultrasound image was saved for documentation purposes. The paracentesis was performed. The catheter was removed and a dressing was applied. The patient tolerated the procedure well without immediate post procedural complication. FINDINGS: A total of approximately 9.8 L of hazy, dark yellow fluid was removed. Samples were sent to the laboratory as requested by the clinical team. IMPRESSION: Successful ultrasound-guided paracentesis yielding 9.8 liters of peritoneal fluid. Read by: Ascencion Dike PA-C Electronically Signed   By: Aletta Edouard M.D.   On: 06/12/2017 13:25      Lab Results  Component Value Date   HGBA1C 5.4 06/12/2017   HGBA1C 9.5 (H) 03/24/2017   HGBA1C 11.2 (H) 02/15/2016   Lab Results  Component Value Date   LDLCALC 62 01/27/2012   CREATININE 4.58 (H) 06/12/2017       Scheduled Meds: . amLODipine  10 mg Oral BID  . atorvastatin  40 mg Oral q1800  . darbepoetin (ARANESP) injection - DIALYSIS  150 mcg Intravenous Q Fri-HD  . docusate  100 mg Oral TID  . feeding supplement (PRO-STAT SUGAR FREE 64)  30 mL Oral BID  . fentaNYL  25 mcg Transdermal Q72H  . insulin aspart  0-9 Units Subcutaneous TID WC  . insulin glargine  14 Units Subcutaneous QHS  . insulin glargine  20 Units Subcutaneous Daily  . isosorbide dinitrate  60 mg Oral BID  . labetalol  300 mg Oral TID  . metoCLOPramide  10 mg Oral TID  . multivitamin  1 tablet Oral QHS  . pantoprazole  40 mg Oral Daily  . sertraline  50 mg Oral Daily  . sevelamer carbonate  2,400 mg Oral TID WC  . sodium hypochlorite   Irrigation BID  . spironolactone  100 mg Oral Daily   Continuous Infusions: . piperacillin-tazobactam (ZOSYN)  IV Stopped (06/12/17 1751)  . vancomycin       LOS: 2 days    Time spent: >30 MINS    Kayleen Memos  [facility] Pager [phone]. If 7PM-7AM, please contact night-coverage at [email or web address], password [facility] 06/13/2017, 7:45 AM  LOS: 2 days

## 2017-06-14 DIAGNOSIS — N186 End stage renal disease: Secondary | ICD-10-CM

## 2017-06-14 DIAGNOSIS — M869 Osteomyelitis, unspecified: Secondary | ICD-10-CM

## 2017-06-14 DIAGNOSIS — Z87891 Personal history of nicotine dependence: Secondary | ICD-10-CM

## 2017-06-14 DIAGNOSIS — Z992 Dependence on renal dialysis: Secondary | ICD-10-CM

## 2017-06-14 DIAGNOSIS — Z823 Family history of stroke: Secondary | ICD-10-CM

## 2017-06-14 DIAGNOSIS — Z881 Allergy status to other antibiotic agents status: Secondary | ICD-10-CM

## 2017-06-14 DIAGNOSIS — Z882 Allergy status to sulfonamides status: Secondary | ICD-10-CM

## 2017-06-14 DIAGNOSIS — M8610 Other acute osteomyelitis, unspecified site: Secondary | ICD-10-CM

## 2017-06-14 DIAGNOSIS — L89154 Pressure ulcer of sacral region, stage 4: Secondary | ICD-10-CM

## 2017-06-14 DIAGNOSIS — Z8249 Family history of ischemic heart disease and other diseases of the circulatory system: Secondary | ICD-10-CM

## 2017-06-14 DIAGNOSIS — Z9862 Peripheral vascular angioplasty status: Secondary | ICD-10-CM

## 2017-06-14 DIAGNOSIS — I739 Peripheral vascular disease, unspecified: Secondary | ICD-10-CM

## 2017-06-14 LAB — GLUCOSE, CAPILLARY
GLUCOSE-CAPILLARY: 157 mg/dL — AB (ref 65–99)
GLUCOSE-CAPILLARY: 92 mg/dL (ref 65–99)
Glucose-Capillary: 143 mg/dL — ABNORMAL HIGH (ref 65–99)
Glucose-Capillary: 341 mg/dL — ABNORMAL HIGH (ref 65–99)

## 2017-06-14 LAB — PROCALCITONIN: PROCALCITONIN: 0.81 ng/mL

## 2017-06-14 MED ORDER — HEPARIN SODIUM (PORCINE) 1000 UNIT/ML DIALYSIS: 1000.0000 [IU] | INTRAMUSCULAR | Status: DC | PRN

## 2017-06-14 MED ORDER — SODIUM CHLORIDE 0.9 % IV SOLN: 100.0000 mL | INTRAVENOUS | Status: DC | PRN

## 2017-06-14 MED ORDER — LIDOCAINE-PRILOCAINE 2.5-2.5 % EX CREA: 1.0000 "application " | TOPICAL_CREAM | CUTANEOUS | Status: DC | PRN

## 2017-06-14 MED ORDER — PENTAFLUOROPROP-TETRAFLUOROETH EX AERO: 1.0000 "application " | INHALATION_SPRAY | CUTANEOUS | Status: DC | PRN

## 2017-06-14 MED ORDER — VANCOMYCIN HCL IN DEXTROSE 1-5 GM/200ML-% IV SOLN
1000.0000 mg | Freq: Once | INTRAVENOUS | Status: AC
  Administered 2017-06-14: 1000 mg via INTRAVENOUS
  Filled 2017-06-14: qty 200

## 2017-06-14 MED ORDER — AMLODIPINE BESYLATE 10 MG PO TABS
10.0000 mg | ORAL_TABLET | Freq: Every day | ORAL | Status: DC
  Administered 2017-06-14 – 2017-06-22 (×9): 10 mg via ORAL
  Filled 2017-06-14 (×10): qty 1

## 2017-06-14 MED ORDER — LIDOCAINE HCL (PF) 1 % IJ SOLN: 5.0000 mL | INTRAMUSCULAR | Status: DC | PRN

## 2017-06-14 MED ORDER — ALTEPLASE 2 MG IJ SOLR: 2.0000 mg | Freq: Once | INTRAMUSCULAR | Status: DC | PRN

## 2017-06-14 NOTE — Consult Note (Signed)
[facility]ious [facility]  Total days of antibiotics 3        Day 3 pitpazo        Day 3 vanco               Reason for Consult: chronic wound   Referring Physician: amin Active Problems:   End stage renal disease on dialysis [facility])   PAD (peripheral artery disease) (HCC)   CAD (coronary artery disease)   Ascites   Fluid overload   Hypertension   Brain mass   Fall   Chronic renal failure   HPI: [name] is a 42 y.o. female with PAD, ESRD on HD, who has had poorly healing wound with necrosis s/p underwent a right CFA, profunda, SFA and external iliac endarterectomy with bovine patch angioplasty on 04/16/17. Her course was complicated by cardiac arrest x2 during and post surgery; she had a non healing wound to her right groin needing extensive wound care. she subsequently underwent a  TMA and exploration of her groin with evacuation of seroma and wound vac placement on 05/09/17. She was discharged to [facility] and wound management until Jan 15th then onto a SNF but then had a ground level fall that led to this admission on 1/17. She still has poorly healing groin wound and the site of her TMA is very dark and edges appear necrotic, worsened over time.   In the ER, a NCHCT showed a hyperdense area concerning for hemorrhagic mass and neurosurgery was consulted and an MRI was ordered.  She has been asymptomatic with regards to mass. She also found found to have ascites and underwent a 9.8L paracentesis. (she has had past paracentesis but it has been several years ago) she states that she had headache initially from the her fall but now asymptomatic. She is getting her appetite back but overall does not feel like her right foot is improving. She states that she has associated pain with it. She has not had to be previously treated for abtx for her sacral wound but it is concerning for osteo per primary team. She was started on vancomycin and piptazo empirically as ID has  been asked to weigh in on management. She has been seen by wound care who has made several recommendations for management of wounds  Past Medical History:  Diagnosis Date  . Arthritis   . CHF (congestive heart failure) ([place])   . Chronic anemia    2nd to renal disease  . CIN III (cervical intraepithelial neoplasia grade III) with severe dysplasia    S/P LEEP AND CONE  . Coronary artery disease    Status post cardiac catheterization June 2012 scattered coronary artery disease/atherosclerosis with 70-80% stenosis in a small right PDA.  . Diabetic Charcot's joint disease ([place])   . DM (diabetes mellitus) ([place])    Long-term insulin  . End stage renal disease on dialysis [facility]) 05/02/11   "[facility]"; [facility]; M; W, F ("got it 04/15/2017 because of Thanksgiving")  . Fracture of 5th metatarsal 2016   Right  . Gastroparesis   . Heart murmur   . 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 History of abscesses in groins 12/06/2010  . Hyperlipidemia    Hypertriglyceridemia 449 HDL 25  . Hypertension   . Migraines    "just on dialysis days"  . Peripheral neuropathy    related to DM  . Peripheral vascular disease ([place])    Tibial occlusive disease evaluated by  Dr. Kellie Simmering in August 2011. Medical therapy  . Renal insufficiency    Dialysis since 2012  . Seizures ([place])    "only when her sugar drops" (04/17/2017)  . Tobacco use disorder    Discontinued March 2012  . Trimalleolar fracture of ankle, closed 02/09/2015   Right    Allergies:  Allergies  Allergen Reactions  . Cephalexin Other (See Comments)    Reaction unknown- Childhood allergy Tolerated Ceftriaxone in the past  . Sulfamethoxazole-Trimethoprim Other (See Comments)    Unknown reaction. Pt states that she was told by her mother that she had allergy to Bactrim as a child.    MEDICATIONS: . amLODipine  10 mg Oral QHS  . atorvastatin  40 mg Oral q1800  . cloNIDine  0.3 mg Oral TID  . darbepoetin (ARANESP) injection - DIALYSIS  200 mcg  Intravenous Q Fri-HD  . docusate  100 mg Oral TID  . feeding supplement (PRO-STAT SUGAR FREE 64)  30 mL Oral BID  . fentaNYL  25 mcg Transdermal Q72H  . insulin aspart  0-9 Units Subcutaneous TID WC  . insulin glargine  14 Units Subcutaneous QHS  . insulin glargine  20 Units Subcutaneous Daily  . isosorbide dinitrate  60 mg Oral BID  . labetalol  300 mg Oral TID  . metoCLOPramide  2.5 mg Oral TID AC  . multivitamin  1 tablet Oral QHS  . pantoprazole  40 mg Oral Daily  . sertraline  50 mg Oral Daily  . sevelamer carbonate  2,400 mg Oral TID WC  . sodium hypochlorite   Irrigation BID    Social History   Tobacco Use  . Smoking status: Former Smoker    Packs/day: 0.30    Years: 10.00    Pack years: 3.00    Types: Cigarettes    Last attempt to quit: 08/05/2011    Years since quitting: 5.8  . Smokeless tobacco: Never Used  Substance Use Topics  . Alcohol use: No  . Drug use: No    Family History  Problem Relation Age of Onset  . Diabetes Mother   . Hypertension Mother   . Diabetes Father   . Hyperlipidemia Father   . Hypertension Father   . Diabetes Sister   . Stroke Maternal Grandmother   . Diabetes Sister   . Breast cancer Maternal Aunt        Age 59's    Review of Systems - Per hpi, otherwise 12 point ros is negative. Has had significant weightloss since her hospitalizations in the fall.  OBJECTIVE: Temp:  [97.7 F (36.5 C)-99.3 F (37.4 C)] 97.7 F (36.5 C) (01/19 1341) Pulse Rate:  [74-81] 81 (01/19 1341) Resp:  [14-20] 14 (01/19 1341) BP: (158-201)/(59-84) 201/76 (01/19 1341) SpO2:  [91 %-100 %] 96 % (01/19 1341) Weight:  [202 lb 13.2 oz (92 kg)] 202 lb 13.2 oz (92 kg) (01/19 0745) Physical Exam  Constitutional:  oriented to person, place, and time. appears older than stated age and chronically ill. No distress.  HENT: [place]/AT, PERRLA, no scleral icterus Mouth/Throat: Oropharynx is clear and moist. No oropharyngeal exudate.  Cardiovascular: Normal rate,  regular rhythm and 3/6 holosystolic murmur Pulmonary/Chest: Effort normal and breath sounds normal. No respiratory distress.  has no wheezes.  Neck = supple, no nuchal rigidity Abdominal: Soft.excess skin, mild ascites still evident. Nontender, non distended Groin = right inguinal region, has serous drainage with small amount of exudate in the wound bed Pelvis= deep wound bed with exudate, plenty  of greenish drainage Neurological: alert and oriented to person, place, and time.  Ext: warm right leg by foot with TMA has evidence of necrosis.  Skin: Skin is warm and dry. No rash noted. No erythema.  Psychiatric: a normal mood and affect.  behavior is normal.    LABS: Results for orders placed or performed during the hospital encounter of 06/11/17 (from the past 48 hour(s))  Glucose, capillary     Status: None   Collection Time: 06/12/17  5:13 PM  Result Value Ref Range   Glucose-Capillary 98 65 - 99 mg/dL  Glucose, capillary     Status: None   Collection Time: 06/12/17  9:43 PM  Result Value Ref Range   Glucose-Capillary 80 65 - 99 mg/dL  Glucose, capillary     Status: Abnormal   Collection Time: 06/13/17  8:23 AM  Result Value Ref Range   Glucose-Capillary 207 (H) 65 - 99 mg/dL  Procalcitonin     Status: None   Collection Time: 06/13/17  9:15 AM  Result Value Ref Range   Procalcitonin 0.70 ng/mL    Comment:        Interpretation: PCT > 0.5 ng/mL and <= 2 ng/mL: Systemic infection (sepsis) is possible, but other conditions are known to elevate PCT as well. (NOTE)       Sepsis PCT Algorithm           Lower Respiratory Tract                                      Infection PCT Algorithm    ----------------------------     ----------------------------         PCT < 0.25 ng/mL                PCT < 0.10 ng/mL         Strongly encourage             Strongly discourage   discontinuation of antibiotics    initiation of antibiotics    ----------------------------      -----------------------------       PCT 0.25 - 0.50 ng/mL            PCT 0.10 - 0.25 ng/mL               OR       >80% decrease in PCT            Discourage initiation of                                            antibiotics      Encourage discontinuation           of antibiotics    ----------------------------     -----------------------------         PCT >= 0.50 ng/mL              PCT 0.26 - 0.50 ng/mL                AND       <80% decrease in PCT             Encourage initiation of  antibiotics       Encourage continuation           of antibiotics    ----------------------------     -----------------------------        PCT >= 0.50 ng/mL                  PCT > 0.50 ng/mL               AND         increase in PCT                  Strongly encourage                                      initiation of antibiotics    Strongly encourage escalation           of antibiotics                                     -----------------------------                                           PCT <= 0.25 ng/mL                                                 OR                                        > 80% decrease in PCT                                     Discontinue / Do not initiate                                             antibiotics   Comprehensive metabolic panel     Status: Abnormal   Collection Time: 06/13/17  9:15 AM  Result Value Ref Range   Sodium 137 135 - 145 mmol/L   Potassium 4.1 3.5 - 5.1 mmol/L   Chloride 96 (L) 101 - 111 mmol/L   CO2 26 22 - 32 mmol/L   Glucose, Bld 213 (H) 65 - 99 mg/dL   BUN 48 (H) 6 - 20 mg/dL   Creatinine, Ser 5.56 (H) 0.44 - 1.00 mg/dL   Calcium 8.2 (L) 8.9 - 10.3 mg/dL   Total Protein 5.2 (L) 6.5 - 8.1 g/dL   Albumin 1.8 (L) 3.5 - 5.0 g/dL   AST 13 (L) 15 - 41 U/L   ALT 11 (L) 14 - 54 U/L   Alkaline Phosphatase 81 38 - 126 U/L   Total Bilirubin 0.8 0.3 - 1.2 mg/dL   GFR calc non Af Amer 9 (L) >60 mL/min     GFR calc Af Amer 10 (L) >60 mL/min  Comment: (NOTE) The eGFR has been calculated using the CKD EPI equation. This calculation has not been validated in all clinical situations. eGFR's persistently <60 mL/min signify possible Chronic Kidney Disease.    Anion gap 15 5 - 15  CBC     Status: Abnormal   Collection Time: 06/13/17  9:15 AM  Result Value Ref Range   WBC 8.5 4.0 - 10.5 K/uL   RBC 2.58 (L) 3.87 - 5.11 MIL/uL   Hemoglobin 7.3 (L) 12.0 - 15.0 g/dL   HCT 23.4 (L) 36.0 - 46.0 %   MCV 90.7 78.0 - 100.0 fL   MCH 28.3 26.0 - 34.0 pg   MCHC 31.2 30.0 - 36.0 g/dL   RDW 16.6 (H) 11.5 - 15.5 %   Platelets 214 150 - 400 K/uL  Phosphorus     Status: Abnormal   Collection Time: 06/13/17  9:15 AM  Result Value Ref Range   Phosphorus 5.3 (H) 2.5 - 4.6 mg/dL  Glucose, capillary     Status: Abnormal   Collection Time: 06/13/17  2:18 PM  Result Value Ref Range   Glucose-Capillary 154 (H) 65 - 99 mg/dL  Glucose, capillary     Status: Abnormal   Collection Time: 06/13/17  5:13 PM  Result Value Ref Range   Glucose-Capillary 139 (H) 65 - 99 mg/dL  Glucose, capillary     Status: Abnormal   Collection Time: 06/13/17  9:19 PM  Result Value Ref Range   Glucose-Capillary 254 (H) 65 - 99 mg/dL  Procalcitonin     Status: None   Collection Time: 06/14/17  4:58 AM  Result Value Ref Range   Procalcitonin 0.81 ng/mL    Comment:        Interpretation: PCT > 0.5 ng/mL and <= 2 ng/mL: Systemic infection (sepsis) is possible, but other conditions are known to elevate PCT as well. (NOTE)       Sepsis PCT Algorithm           Lower Respiratory Tract                                      Infection PCT Algorithm    ----------------------------     ----------------------------         PCT < 0.25 ng/mL                PCT < 0.10 ng/mL         Strongly encourage             Strongly discourage   discontinuation of antibiotics    initiation of antibiotics    ----------------------------      -----------------------------       PCT 0.25 - 0.50 ng/mL            PCT 0.10 - 0.25 ng/mL               OR       >80% decrease in PCT            Discourage initiation of                                            antibiotics      Encourage discontinuation           of antibiotics    ----------------------------     -----------------------------  PCT >= 0.50 ng/mL              PCT 0.26 - 0.50 ng/mL                AND       <80% decrease in PCT             Encourage initiation of                                             antibiotics       Encourage continuation           of antibiotics    ----------------------------     -----------------------------        PCT >= 0.50 ng/mL                  PCT > 0.50 ng/mL               AND         increase in PCT                  Strongly encourage                                      initiation of antibiotics    Strongly encourage escalation           of antibiotics                                     -----------------------------                                           PCT <= 0.25 ng/mL                                                 OR                                        > 80% decrease in PCT                                     Discontinue / Do not initiate                                             antibiotics   Glucose, capillary     Status: Abnormal   Collection Time: 06/14/17  7:27 AM  Result Value Ref Range   Glucose-Capillary 157 (H) 65 - 99 mg/dL  Glucose, capillary     Status: None   Collection Time: 06/14/17 12:46 PM  Result Value Ref Range   Glucose-Capillary 92 65 - 99 mg/dL   Erythrocyte Sedimentation Rate  Component Value Date/Time   ESRSEDRATE 115 (H) 06/12/2017 0813    MICRO: 1/17 blood cx ngtd 1/17 ascites cx ngtd IMAGING: pelvic ct showing sacral wound going to coccyx with changes concerning for early osteo  Assessment/Plan: 42yo F with ESRD on HD, PAD/PVD s/p underwent a right CFA, profunda,  SFA and external iliac endarterectomy with bovine patch angioplasty on 92/33/00 complicated by poor wound healing to right groin and ongoing changes of necrosis to right TMA. 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 Defer to vascular if they think if it needs some debridement - right necrotic foot - I suspect it needs further amputation.  Elzie Rings [facility] Infectious [facility] [phone]

## 2017-06-14 NOTE — Procedures (Signed)
I was present at this session.  I have reviewed the session itself and made appropriate changes.  HD via LLA AVF. bp ^, to get off 4 L.  Still vol xs. Access press ok Mauricia Area 1/19/20198:20 AM

## 2017-06-14 NOTE — Progress Notes (Signed)
Subjective: Interval History: has no complaint, HD 2nd d in a row.  Objective: Vital signs in last 24 hours: Temp:  [98 F (36.7 C)-99.3 F (37.4 C)] 99.3 F (37.4 C) (01/19 0540) Pulse Rate:  [76-84] 76 (01/19 0540) Resp:  [16-20] 18 (01/19 0540) BP: (156-187)/(59-82) 160/62 (01/19 0540) SpO2:  [91 %-100 %] 100 % (01/19 0540) Weight:  [85.2 kg (187 lb 13.3 oz)-92 kg (202 lb 13.2 oz)] 92 kg (202 lb 13.2 oz) (01/18 2121) Weight change: 0.1 kg (3.5 oz)  Intake/Output from previous day: 01/18 0701 - 01/19 0700 In: 420 [P.O.:120; IV Piggyback:300] Out: 3500  Intake/Output this shift: No intake/output data recorded.  General appearance: cooperative, no distress, pale and slowed mentation Resp: rales bibasilar Cardio: S1, S2 normal and systolic murmur: holosystolic 2/6, blowing at apex GI: pos FW, mild distension Extremities: edema 1+ and R foot with dressing, sacral decub dressing  Lab Results: Recent Labs    06/12/17 0813 06/13/17 0915  WBC 8.7 8.5  HGB 8.9* 7.3*  HCT 28.3* 23.4*  PLT 268 214   BMET:  Recent Labs    06/12/17 0813 06/13/17 0915  NA 139 137  K 4.1 4.1  CL 97* 96*  CO2 28 26  GLUCOSE 125* 213*  BUN 37* 48*  CREATININE 4.58* 5.56*  CALCIUM 8.7* 8.2*   No results for input(s): PTH in the last 72 hours. Iron Studies: No results for input(s): IRON, TIBC, TRANSFERRIN, FERRITIN in the last 72 hours.  Studies/Results: Ir Paracentesis  Result Date: 06/12/2017 INDICATION: Abdominal distention secondary to ascites. Request for diagnostic and therapeutic paracentesis. EXAM: ULTRASOUND GUIDED RIGHT LOWER QUADRANT PARACENTESIS MEDICATIONS: None. COMPLICATIONS: None immediate. PROCEDURE: Informed written consent was obtained from the patient after a discussion of the risks, benefits and alternatives to treatment. A timeout was performed prior to the initiation of the procedure. Initial ultrasound scanning demonstrates a large amount of ascites within the right  lower abdominal quadrant. The right lower abdomen was prepped and draped in the usual sterile fashion. 1% lidocaine with epinephrine was used for local anesthesia. Following this, a 19 gauge, 10-cm, Yueh catheter was introduced. An ultrasound image was saved for documentation purposes. The paracentesis was performed. The catheter was removed and a dressing was applied. The patient tolerated the procedure well without immediate post procedural complication. FINDINGS: A total of approximately 9.8 L of hazy, dark yellow fluid was removed. Samples were sent to the laboratory as requested by the clinical team. IMPRESSION: Successful ultrasound-guided paracentesis yielding 9.8 liters of peritoneal fluid. Read by: Ascencion Dike PA-C Electronically Signed   By: Aletta Edouard M.D.   On: 06/12/2017 13:25    I have reviewed the patient's current medications.  Assessment/Plan: 1 ESRD vol xs. D/C from [facility] with inadequate dialysis and vol control. 2 HTN lower vol, lower meds 3 Anemia esa, check Fe 4 HPTH 5 Severe PVD  Necrotic foot, needs amp 6 Sacral decub 7 CM  8 DM controlled 9 Ascites vol xs P HD, lower vol, esa, wound care, lower bp meds    LOS: 3 days   Jeneen Rinks Judyth Demarais 06/14/2017,8:21 AM

## 2017-06-14 NOTE — Progress Notes (Signed)
PROGRESS NOTE    [name]  JYN:[id number] DOB: 1976/02/22 DOA: 06/11/2017 PCP: Merrilee Seashore, MD   Brief Narrative:  42 year old female with history of ESRD on hemodialysis Monday Wednesday Friday, severe peripheral vascular disease, coronary artery disease, essential hypertension, recent cardiac arrest in November 2018 and had an extended stay due to ventilatory dependent respiratory failure.  Eventually patient was discharged to the long-term care facility and unfortunately had a fall over there therefore was sent to the hospital again.  CT in the ER showed 8 mm hyperdense lesion concerning for hemorrhagic mass and parietal area.  Case was discussed with neurosurgery who suggest getting an MRI on nonemergent basis as patient is at this time.  Unfortunately patient is unable to lay flat.  She underwent large volume paracentesis which has made her abdomen feel better.  It appears she also has necrotic/infected sacral wound and CT of the pelvis confirms she has osteomyelitis therefore is is getting IV antibiotics.  Patient has been followed by vascular surgery for her right lower extremity amputation now concerning for nonviable limb.   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  Will consult infectious disease to assist with antibiotic and determining duration of the treatment as appropriate. 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  Continue wound care -Patient will need  amputation per vascular surgery.  Vascular surgery is following -Due to patient's comorbidities patient remains moderate to high risk for any surgical procedure.  ESRD on hemodialysis -Status post dialysis yesterday, continue Monday Wednesday Friday schedule -Nephrology is following  Fall status post hemorrhagic mass - At about 8 mm in size in the parietal lobe -Neurosurgery has been consulted who recommends MRI on nonemergent basis which can also be done as an outpatient.  Can follow-up outpatient.  Patient refuses to lay flat due to her back pain from the sacral decubitus ulcer  Chronic systolic congestive heart failure, ejection fraction 30-35% - She is on HD for fluid removal, also received paracentesis yesterday for her ascites -Closely monitor her weight, fluid restriction  Anemia of chronic disease -Likely from her ESRD.  Hemoglobin is somewhat stable without any obvious signs of blood loss.  If it drops below 7 will transfuse her.   DVT prophylaxis: SCDs Code Status: Full code Family Communication: None at bedside Disposition Plan: Maintain inpatient stay  Consultants:   Vascular surgery  Infectious disease  Antimicrobials:   Vancomycin 1/17  Zosyn 1/17   Subjective: Patient states her abdominal pain is little better but continued from her ulcer.  No acute events overnight.  Objective: Vitals:   06/13/17 1419 06/13/17 1739 06/13/17 2121 06/14/17 0540  BP: (!) 180/81 (!) 158/59 (!) 166/71 (!) 160/62  Pulse: 84 78 81 76  Resp: 18 16 20 18   Temp: 98.6 F (37 C)  98.6 F (37 C) 99.3 F (37.4 C)  TempSrc: Oral  Oral Oral  SpO2: 94% 98% 91% 100%  Weight:   92 kg (202 lb 13.2 oz)   Height:  Intake/Output Summary (Last 24 hours) at 06/14/2017 0727 Last data filed at 06/14/2017 0600 Gross per 24 hour  Intake 420 ml  Output 3500 ml  Net -3080 ml   Filed Weights   06/13/17 0900 06/13/17 1324 06/13/17 2121  Weight: 88.6 kg (195 lb 5.2 oz) 85.2 kg (187  lb 13.3 oz) 92 kg (202 lb 13.2 oz)    Examination:  General exam: Appears calm and comfortable  Respiratory system: Clear to auscultation. Respiratory effort normal. Cardiovascular system: S1 & S2 heard, RRR. No JVD, murmurs, rubs, gallops or clicks. No pedal edema. Gastrointestinal system: Abdomen is nondistended, soft and nontender. No organomegaly or masses felt. Normal bowel sounds heard.  Right groin dressing in place Central nervous system: Alert and oriented. No focal neurological deficits. Extremities: Symmetric 5 x 5 power.  Right foot amputation noted-clean surgical dressing in place Skin: No rashes, lesions or ulcers Psychiatry: Judgement and insight appear normal. Mood & affect appropriate.     Data Reviewed:   CBC: Recent Labs  Lab 06/09/17 0500 06/11/17 1757 06/12/17 0813 06/13/17 0915  WBC 9.5 9.3 8.7 8.5  HGB 7.5* 8.9* 8.9* 7.3*  HCT 24.9* 29.7* 28.3* 23.4*  MCV 90.9 91.1 90.7 90.7  PLT 282 281 268 983   Basic Metabolic Panel: Recent Labs  Lab 06/09/17 0500 06/11/17 1757 06/12/17 0813 06/13/17 0915  NA 137 138 139 137  K 5.7* 3.9 4.1 4.1  CL 98* 96* 97* 96*  CO2 22 27 28 26   GLUCOSE 161* 132* 125* 213*  BUN 70* 29* 37* 48*  CREATININE 6.14* 3.77* 4.58* 5.56*  CALCIUM 9.0 8.5* 8.7* 8.2*  MG  --   --  1.7  --   PHOS 4.0  --   --  5.3*   GFR: Estimated Creatinine Clearance: 17.3 mL/min (A) (by C-G formula based on SCr of 5.56 mg/dL (H)). Liver Function Tests: Recent Labs  Lab 06/09/17 0500 06/13/17 0915  AST  --  13*  ALT  --  11*  ALKPHOS  --  81  BILITOT  --  0.8  PROT  --  5.2*  ALBUMIN 1.6* 1.8*   No results for input(s): LIPASE, AMYLASE in the last 168 hours. No results for input(s): AMMONIA in the last 168 hours. Coagulation Profile: Recent Labs  Lab 06/11/17 1922  INR 1.20   Cardiac Enzymes: No results for input(s): CKTOTAL, CKMB, CKMBINDEX, TROPONINI in the last 168 hours. BNP (last 3 results) No results for input(s):  PROBNP in the last 8760 hours. HbA1C: Recent Labs    06/12/17 0813  HGBA1C 5.4   CBG: Recent Labs  Lab 06/12/17 2143 06/13/17 0823 06/13/17 1418 06/13/17 1713 06/13/17 2119  GLUCAP 80 207* 154* 139* 254*   Lipid Profile: No results for input(s): CHOL, HDL, LDLCALC, TRIG, CHOLHDL, LDLDIRECT in the last 72 hours. Thyroid Function Tests: No results for input(s): TSH, T4TOTAL, FREET4, T3FREE, THYROIDAB in the last 72 hours. Anemia Panel: No results for input(s): VITAMINB12, FOLATE, FERRITIN, TIBC, IRON, RETICCTPCT in the last 72 hours. Sepsis Labs: Recent Labs  Lab 06/12/17 0813 06/13/17 0915 06/14/17 0458  PROCALCITON 0.82 0.70 0.81    Recent Results (from the past 240 hour(s))  Culture, blood (routine x 2)     Status: None (Preliminary result)   Collection Time: 06/12/17  9:30 AM  Result Value Ref Range Status   Specimen Description BLOOD RIGHT ANTECUBITAL  Final   Special Requests   Final    BOTTLES DRAWN AEROBIC AND ANAEROBIC Blood  Culture adequate volume   Culture NO GROWTH 1 DAY  Final   Report Status PENDING  Incomplete  Culture, blood (routine x 2)     Status: None (Preliminary result)   Collection Time: 06/12/17  9:45 AM  Result Value Ref Range Status   Specimen Description BLOOD RIGHT FOREARM  Final   Special Requests   Final    BOTTLES DRAWN AEROBIC AND ANAEROBIC Blood Culture adequate volume   Culture NO GROWTH 1 DAY  Final   Report Status PENDING  Incomplete  MRSA PCR Screening     Status: None   Collection Time: 06/12/17 10:45 AM  Result Value Ref Range Status   MRSA by PCR NEGATIVE NEGATIVE Final    Comment:        The GeneXpert MRSA Assay (FDA approved for NASAL specimens only), is one component of a comprehensive MRSA colonization surveillance program. It is not intended to diagnose MRSA infection nor to guide or monitor treatment for MRSA infections.   Gram stain     Status: None   Collection Time: 06/12/17  1:19 PM  Result Value Ref  Range Status   Specimen Description FLUID  Final   Special Requests NONE  Final   Gram Stain NO WBC SEEN NO ORGANISMS SEEN   Final   Report Status 06/12/2017 FINAL  Final  Culture, body fluid-bottle     Status: None (Preliminary result)   Collection Time: 06/12/17  1:19 PM  Result Value Ref Range Status   Specimen Description FLUID PERITONEAL  Final   Special Requests BOTTLES DRAWN AEROBIC AND ANAEROBIC  Final   Culture NO GROWTH < 24 HOURS  Final   Report Status PENDING  Incomplete         Radiology Studies: Ir Paracentesis  Result Date: 06/12/2017 INDICATION: Abdominal distention secondary to ascites. Request for diagnostic and therapeutic paracentesis. EXAM: ULTRASOUND GUIDED RIGHT LOWER QUADRANT PARACENTESIS MEDICATIONS: None. COMPLICATIONS: None immediate. PROCEDURE: Informed written consent was obtained from the patient after a discussion of the risks, benefits and alternatives to treatment. A timeout was performed prior to the initiation of the procedure. Initial ultrasound scanning demonstrates a large amount of ascites within the right lower abdominal quadrant. The right lower abdomen was prepped and draped in the usual sterile fashion. 1% lidocaine with epinephrine was used for local anesthesia. Following this, a 19 gauge, 10-cm, Yueh catheter was introduced. An ultrasound image was saved for documentation purposes. The paracentesis was performed. The catheter was removed and a dressing was applied. The patient tolerated the procedure well without immediate post procedural complication. FINDINGS: A total of approximately 9.8 L of hazy, dark yellow fluid was removed. Samples were sent to the laboratory as requested by the clinical team. IMPRESSION: Successful ultrasound-guided paracentesis yielding 9.8 liters of peritoneal fluid. Read by: Ascencion Dike PA-C Electronically Signed   By: Aletta Edouard M.D.   On: 06/12/2017 13:25        Scheduled Meds: . amLODipine  10 mg Oral  BID  . atorvastatin  40 mg Oral q1800  . cloNIDine  0.3 mg Oral TID  . darbepoetin (ARANESP) injection - DIALYSIS  200 mcg Intravenous Q Fri-HD  . docusate  100 mg Oral TID  . feeding supplement (PRO-STAT SUGAR FREE 64)  30 mL Oral BID  . fentaNYL  25 mcg Transdermal Q72H  . insulin aspart  0-9 Units Subcutaneous TID WC  . insulin glargine  14 Units Subcutaneous QHS  . insulin glargine  20 Units Subcutaneous Daily  .  isosorbide dinitrate  60 mg Oral BID  . labetalol  300 mg Oral TID  . metoCLOPramide  2.5 mg Oral TID AC  . multivitamin  1 tablet Oral QHS  . pantoprazole  40 mg Oral Daily  . sertraline  50 mg Oral Daily  . sevelamer carbonate  2,400 mg Oral TID WC  . sodium hypochlorite   Irrigation BID   Continuous Infusions: . piperacillin-tazobactam (ZOSYN)  IV Stopped (06/14/17 0401)  . vancomycin 1,000 mg (06/13/17 1211)     LOS: 3 days    Time spent: 35 mins    Curtiss Mahmood Arsenio Loader, MD [facility] Pager [phone]   If 7PM-7AM, please contact night-coverage [email or web address] Password [facility] 06/14/2017, 7:27 AM

## 2017-06-15 DIAGNOSIS — T148XXD Other injury of unspecified body region, subsequent encounter: Secondary | ICD-10-CM | POA: Diagnosis present

## 2017-06-15 DIAGNOSIS — M4628 Osteomyelitis of vertebra, sacral and sacrococcygeal region: Secondary | ICD-10-CM | POA: Diagnosis present

## 2017-06-15 DIAGNOSIS — M86271 Subacute osteomyelitis, right ankle and foot: Secondary | ICD-10-CM

## 2017-06-15 DIAGNOSIS — L02214 Cutaneous abscess of groin: Secondary | ICD-10-CM

## 2017-06-15 LAB — GLUCOSE, CAPILLARY
GLUCOSE-CAPILLARY: 348 mg/dL — AB (ref 65–99)
Glucose-Capillary: 261 mg/dL — ABNORMAL HIGH (ref 65–99)
Glucose-Capillary: 174 mg/dL — ABNORMAL HIGH (ref 65–99)
Glucose-Capillary: 181 mg/dL — ABNORMAL HIGH (ref 65–99)

## 2017-06-15 MED ORDER — NEPRO/CARBSTEADY PO LIQD
237.0000 mL | Freq: Two times a day (BID) | ORAL | Status: DC
  Filled 2017-06-15 (×12): qty 237

## 2017-06-15 NOTE — Progress Notes (Signed)
Notified MD Amin of Hgb 7.3 since 06/13/17. Per MD note continue to monitor.

## 2017-06-15 NOTE — Progress Notes (Signed)
PROGRESS NOTE    [name]  WUJ:[id number] DOB: Oct 17, 1975 DOA: 06/11/2017 PCP: Merrilee Seashore, MD   Brief Narrative:  42 year old female with history of ESRD on hemodialysis Monday Wednesday Friday, severe peripheral vascular disease, coronary artery disease, essential hypertension, recent cardiac arrest in November 2018 and had an extended stay due to ventilatory dependent respiratory failure.  Eventually patient was discharged to the long-term care facility and unfortunately had a fall over there therefore was sent to the hospital again.  CT in the ER showed 8 mm hyperdense lesion concerning for hemorrhagic mass and parietal area.  Case was discussed with neurosurgery who suggest getting an MRI on nonemergent basis as patient is at this time.  Unfortunately patient is unable to lay flat.  She underwent large volume paracentesis which has made her abdomen feel better.  It appears she also has necrotic/infected sacral wound and CT of the pelvis confirms she has osteomyelitis therefore is is getting IV antibiotics.  Patient has been followed by vascular surgery for her right lower extremity amputation now concerning for nonviable limb.   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  -Appreciate infectious disease evaluation. -Provide supportive care and pain control -Waiting on vascular surgery to see the patient to determine if patient needs a wound VAC in the right groin Pain control  Peripheral vascular disease status post right TMA, stable.    -Area appears to be necrotic.  Continue wound care. -Patient will need amputation per vascular surgery.  Vascular surgery is following -Due to patient's comorbidities patient remains moderate to high risk for any surgical procedure.  ESRD on hemodialysis -Status post dialysis yesterday, continue Monday Wednesday Friday schedule -Nephrology is following -Labs are pending today.  Fall status post hemorrhagic mass, stable - At about 8 mm in size in the parietal lobe -Neurosurgery has been consulted who recommends MRI on nonemergent basis which can also be done as an outpatient.  Can follow-up outpatient.  Patient refuses to lay flat due to her back pain from the sacral decubitus ulcer. Patient is aware and would like to get this outpatient.   Chronic systolic congestive heart failure, ejection fraction 30-35% - She is on HD for fluid removal, also received paracentesis 1/18 for her ascites -Closely monitor her weight, fluid restriction  Anemia of chronic disease -Likely from her ESRD.  Hemoglobin is somewhat stable without any obvious signs of blood loss.  If it drops below 7 will transfuse her.  Elevated BP/Essential HTN -All of her home meds are resumed including labetalol, clonidine, Norvasc, hydralazine, immature.  I will also order IV hydralazine as needed.  We can uptitrate this as necessary.   DVT prophylaxis: SCDs Code Status: Full code Family Communication: None at bedside Disposition Plan: Maintain inpatient stay  Consultants:   Vascular surgery  Infectious disease  Antimicrobials:   Vancomycin 1/17  Zosyn 1/17   Subjective: No complaints today beside in her feet. No acute events overnight.   Objective: Vitals:   06/14/17 1341 06/14/17 1718 06/14/17 1719 06/15/17 0517  BP: (!) 201/76 Marland Kitchen)  172/78 (!) 164/72 (!) 184/80  Pulse: 81 82 78 81  Resp: 14 16 14 16   Temp: 97.7 F (36.5 C)   (!) 97.3 F (36.3 C)  TempSrc: Oral   Oral  SpO2: 96% 98%  98%  Weight:       Height:        Intake/Output Summary (Last 24 hours) at 06/15/2017 0934 Last data filed at 06/15/2017 3151 Gross per 24 hour  Intake 460 ml  Output 3700 ml  Net -3240 ml   Filed Weights   06/13/17 1324 06/13/17 2121 06/14/17 0745  Weight: 85.2 kg (187 lb 13.3 oz) 92 kg (202 lb 13.2 oz) 92 kg (202 lb 13.2 oz)    Examination:  General exam: Appears calm and comfortable  Respiratory system: Clear to auscultation. Respiratory effort normal. Cardiovascular system: S1 & S2 heard, RRR. No JVD, murmurs, rubs, gallops or clicks. No pedal edema. Gastrointestinal system: Abdomen is nondistended, soft and nontender. No organomegaly or masses felt. Normal bowel sounds heard.  Right groin dressing in place Central nervous system: Alert and oriented. No focal neurological deficits. Extremities: Symmetric 5 x 5 power.  Amputation of the right foot noted with clean surgical dressing in place.  Also clean dressing noted in the right groin Skin: No rashes, lesions or ulcers Psychiatry: Judgement and insight appear normal. Mood & affect appropriate.     Data Reviewed:   CBC: Recent Labs  Lab 06/09/17 0500 06/11/17 1757 06/12/17 0813 06/13/17 0915  WBC 9.5 9.3 8.7 8.5  HGB 7.5* 8.9* 8.9* 7.3*  HCT 24.9* 29.7* 28.3* 23.4*  MCV 90.9 91.1 90.7 90.7  PLT 282 281 268 761   Basic Metabolic Panel: Recent Labs  Lab 06/09/17 0500 06/11/17 1757 06/12/17 0813 06/13/17 0915  NA 137 138 139 137  K 5.7* 3.9 4.1 4.1  CL 98* 96* 97* 96*  CO2 22 27 28 26   GLUCOSE 161* 132* 125* 213*  BUN 70* 29* 37* 48*  CREATININE 6.14* 3.77* 4.58* 5.56*  CALCIUM 9.0 8.5* 8.7* 8.2*  MG  --   --  1.7  --   PHOS 4.0  --   --  5.3*   GFR: Estimated Creatinine Clearance: 17.3 mL/min (A) (by C-G formula based on SCr of 5.56 mg/dL (H)). Liver Function Tests: Recent Labs  Lab 06/09/17 0500 06/13/17 0915  AST  --  13*  ALT  --  11*  ALKPHOS  --  81  BILITOT  --  0.8  PROT  --  5.2*  ALBUMIN 1.6* 1.8*    No results for input(s): LIPASE, AMYLASE in the last 168 hours. No results for input(s): AMMONIA in the last 168 hours. Coagulation Profile: Recent Labs  Lab 06/11/17 1922  INR 1.20   Cardiac Enzymes: No results for input(s): CKTOTAL, CKMB, CKMBINDEX, TROPONINI in the last 168 hours. BNP (last 3 results) No results for input(s): PROBNP in the last 8760 hours. HbA1C: No results for input(s): HGBA1C in the last 72 hours. CBG: Recent Labs  Lab 06/14/17 0727 06/14/17 1246 06/14/17 1635 06/14/17 2126 06/15/17 0735  GLUCAP 157* 92 143* 341* 348*   Lipid Profile: No results for input(s): CHOL, HDL, LDLCALC, TRIG, CHOLHDL, LDLDIRECT in the last 72 hours. Thyroid Function Tests: No results for input(s): TSH, T4TOTAL, FREET4, T3FREE, THYROIDAB in the last 72 hours. Anemia Panel: No results for input(s): VITAMINB12, FOLATE, FERRITIN, TIBC, IRON, RETICCTPCT in the last 72 hours. Sepsis Labs: Recent Labs  Lab 06/12/17 0813 06/13/17 0915 06/14/17 6073  PROCALCITON 0.82 0.70 0.81    Recent Results (from the past 240 hour(s))  Culture, blood (routine x 2)     Status: None (Preliminary result)   Collection Time: 06/12/17  9:30 AM  Result Value Ref Range Status   Specimen Description BLOOD RIGHT ANTECUBITAL  Final   Special Requests   Final    BOTTLES DRAWN AEROBIC AND ANAEROBIC Blood Culture adequate volume   Culture NO GROWTH 2 DAYS  Final   Report Status PENDING  Incomplete  Culture, blood (routine x 2)     Status: None (Preliminary result)   Collection Time: 06/12/17  9:45 AM  Result Value Ref Range Status   Specimen Description BLOOD RIGHT FOREARM  Final   Special Requests   Final    BOTTLES DRAWN AEROBIC AND ANAEROBIC Blood Culture adequate volume   Culture NO GROWTH 2 DAYS  Final   Report Status PENDING  Incomplete  MRSA PCR Screening     Status: None   Collection Time: 06/12/17 10:45 AM  Result Value Ref Range Status   MRSA by PCR NEGATIVE NEGATIVE Final     Comment:        The GeneXpert MRSA Assay (FDA approved for NASAL specimens only), is one component of a comprehensive MRSA colonization surveillance program. It is not intended to diagnose MRSA infection nor to guide or monitor treatment for MRSA infections.   Gram stain     Status: None   Collection Time: 06/12/17  1:19 PM  Result Value Ref Range Status   Specimen Description FLUID  Final   Special Requests NONE  Final   Gram Stain NO WBC SEEN NO ORGANISMS SEEN   Final   Report Status 06/12/2017 FINAL  Final  Culture, body fluid-bottle     Status: None (Preliminary result)   Collection Time: 06/12/17  1:19 PM  Result Value Ref Range Status   Specimen Description FLUID PERITONEAL  Final   Special Requests BOTTLES DRAWN AEROBIC AND ANAEROBIC  Final   Culture NO GROWTH 2 DAYS  Final   Report Status PENDING  Incomplete         Radiology Studies: No results found.      Scheduled Meds: . amLODipine  10 mg Oral QHS  . atorvastatin  40 mg Oral q1800  . cloNIDine  0.3 mg Oral TID  . darbepoetin (ARANESP) injection - DIALYSIS  200 mcg Intravenous Q Fri-HD  . docusate  100 mg Oral TID  . feeding supplement (PRO-STAT SUGAR FREE 64)  30 mL Oral BID  . fentaNYL  25 mcg Transdermal Q72H  . insulin aspart  0-9 Units Subcutaneous TID WC  . insulin glargine  14 Units Subcutaneous QHS  . insulin glargine  20 Units Subcutaneous Daily  . isosorbide dinitrate  60 mg Oral BID  . labetalol  300 mg Oral TID  . metoCLOPramide  2.5 mg Oral TID AC  . multivitamin  1 tablet Oral QHS  . pantoprazole  40 mg Oral Daily  . sertraline  50 mg Oral Daily  . sevelamer carbonate  2,400 mg Oral TID WC  . sodium hypochlorite   Irrigation BID   Continuous Infusions: . sodium chloride    . sodium chloride    . piperacillin-tazobactam (ZOSYN)  IV Stopped (06/15/17 0630)  . vancomycin 1,000 mg (06/13/17 1211)     LOS: 4 days    Time spent: 35 mins    Ankit Arsenio Loader, MD [facility] Pager [phone]   If 7PM-7AM,  please contact night-coverage [email or web address] Password [facility] 06/15/2017, 9:34 AM

## 2017-06-15 NOTE — Progress Notes (Signed)
Patient having significant pain at right TMA site which is ischemic.  I discussed proceeding with left BKA this week, possibly Tuesday.  She and her husband are going to consider timing.  I discussed the possibility of an AKA as well.  [name]

## 2017-06-15 NOTE — Progress Notes (Signed)
[facility] Progress Note   Subjective:  Seen in room, no further dyspnea. C/o foot pain. ID involved for concern of new sacral osteomyelitis. On Vanc/Zosyn.  Objective Vitals:   06/14/17 1341 06/14/17 1718 06/14/17 1719 06/15/17 0517  BP: (!) 201/76 (!) 172/78 (!) 164/72 (!) 184/80  Pulse: 81 82 78 81  Resp: 14 16 14 16   Temp: 97.7 F (36.5 C)   (!) 97.3 F (36.3 C)  TempSrc: Oral   Oral  SpO2: 96% 98%  98%  Weight:      Height:       Physical Exam General: NAD, oriented x 3. Heart: RRR; 2/6 systolic murmur Lungs: CTAB decreased bs Extremities: Trace LE edema, bandaged R foot Dialysis Access: AVF ABDM pos bs, ? Pos FE mild distension  Additional Objective Labs: Basic Metabolic Panel: Recent Labs  Lab 06/09/17 0500 06/11/17 1757 06/12/17 0813 06/13/17 0915  NA 137 138 139 137  K 5.7* 3.9 4.1 4.1  CL 98* 96* 97* 96*  CO2 22 27 28 26   GLUCOSE 161* 132* 125* 213*  BUN 70* 29* 37* 48*  CREATININE 6.14* 3.77* 4.58* 5.56*  CALCIUM 9.0 8.5* 8.7* 8.2*  PHOS 4.0  --   --  5.3*   Liver Function Tests: Recent Labs  Lab 06/09/17 0500 06/13/17 0915  AST  --  13*  ALT  --  11*  ALKPHOS  --  81  BILITOT  --  0.8  PROT  --  5.2*  ALBUMIN 1.6* 1.8*   CBC: Recent Labs  Lab 06/09/17 0500 06/11/17 1757 06/12/17 0813 06/13/17 0915  WBC 9.5 9.3 8.7 8.5  HGB 7.5* 8.9* 8.9* 7.3*  HCT 24.9* 29.7* 28.3* 23.4*  MCV 90.9 91.1 90.7 90.7  PLT 282 281 268 214   Blood Culture    Component Value Date/Time   SDES FLUID 06/12/2017 1319   SDES FLUID PERITONEAL 06/12/2017 1319   SPECREQUEST NONE 06/12/2017 1319   SPECREQUEST BOTTLES DRAWN AEROBIC AND ANAEROBIC 06/12/2017 1319   CULT NO GROWTH 2 DAYS 06/12/2017 1319   REPTSTATUS 06/12/2017 FINAL 06/12/2017 1319   REPTSTATUS PENDING 06/12/2017 1319    CBG: Recent Labs  Lab 06/14/17 0727 06/14/17 1246 06/14/17 1635 06/14/17 2126 06/15/17 0735  GLUCAP 157* 92 143* 341* 348*   Medications: . sodium  chloride    . sodium chloride    . piperacillin-tazobactam (ZOSYN)  IV Stopped (06/15/17 0630)  . vancomycin 1,000 mg (06/13/17 1211)   . amLODipine  10 mg Oral QHS  . atorvastatin  40 mg Oral q1800  . cloNIDine  0.3 mg Oral TID  . darbepoetin (ARANESP) injection - DIALYSIS  200 mcg Intravenous Q Fri-HD  . docusate  100 mg Oral TID  . feeding supplement (PRO-STAT SUGAR FREE 64)  30 mL Oral BID  . fentaNYL  25 mcg Transdermal Q72H  . insulin aspart  0-9 Units Subcutaneous TID WC  . insulin glargine  14 Units Subcutaneous QHS  . insulin glargine  20 Units Subcutaneous Daily  . isosorbide dinitrate  60 mg Oral BID  . labetalol  300 mg Oral TID  . metoCLOPramide  2.5 mg Oral TID AC  . multivitamin  1 tablet Oral QHS  . pantoprazole  40 mg Oral Daily  . sertraline  50 mg Oral Daily  . sevelamer carbonate  2,400 mg Oral TID WC    Dialysis Orders: MWF -NW 4.25 hrs, BFR400, DFR800, EDW 85.5kg,2K/2.25Ca,Profile 2, L AVF - Heparin2000 Unit bolus  - Mircera  220mcg IV q 2wks - supposed to start 06/12/17  Assessment/Plan: 1. L parietal lobe Mass- [place] out of bed. 73mm hyper density seen on CT - suspected hemorrage. Neuro consulted, states asymptomatic at this time. MRI ordered. 2. Ascites- s/p paracentesis 9.8L hazy, dark yellow fluid removed. Cx negative. Uremic and underdialysis 3. Sacral Decubitus Ulcer/multiple pressure sores/?Osteomyelitis: CT shows bony changes at coccyx concerning for possible osteomyelitis. On Vanc/Zosyn. ID folling. 4.  ESRD: S/p serial HD. Still not to prior EDW, but closer. Next HD 1/21. No heparin.Needs substantial lower dry 5. Hypertension/volume: BP still high. S/p nearly 10L paracentesis, continuing to get more volume off with HD 6. Anemia- Hgb 7.3, Continue Aranesp 255mcg q Fri. 7. Secondary Hyperparathyroidism : Labs ok. Not on VDRA. Continue binder. 8. Nutrition- Renal diet with fluid restriction. Nepro TID.  9. DM - per primary 10. CHF  - per primary 11. CAD s/p cath - per primary   Janet Penton, PA-C 06/15/2017, 10:06 AM  [facility] Pager: [phone] I have seen and examined this patient and agree with the plan of care seen, eval, examined, counseled patient,plan serial Hd and much lower dry. [name] 06/15/2017, 10:40 AM

## 2017-06-15 NOTE — Progress Notes (Signed)
Pharmacy Antibiotic Note  [name] is a 42 y.o. female admitted on 06/11/2017 with poorly healing R groin wound and sacral wound osteomyelitis.  Pharmacy has been consulted for vancomycin and zosyn dosing. Patient with ESRD on HD MWF.  Patient went for extra HD session yesterday for fluid removal, no vancomycin was given in HD so extra dose was ordered outside HD to make up. ID is following, plans to continue vancomycin/zosyn for now. WBC is wnl, patient is afebrile.   Plan: Continue vancomycin 1 gram IV with HD-MWF. Goal pre-HD vanc level 15-25. Continue zosyn 3.375 gram IV every 12 hours (4-hour infusion) F/u cultures, LOT, ID recs F/u dialysis plan - may need to order extra doses of vanc if continuing with extra HD sessions  Height: 6\' 4"  (193 cm) Weight: 202 lb 13.2 oz (92 kg) IBW/kg (Calculated) : 82.3  Temp (24hrs), Avg:97.7 F (36.5 C), Min:97.3 F (36.3 C), Max:98 F (36.7 C)  Recent Labs  Lab 06/09/17 0500 06/11/17 1757 06/12/17 0813 06/13/17 0915  WBC 9.5 9.3 8.7 8.5  CREATININE 6.14* 3.77* 4.58* 5.56*    Estimated Creatinine Clearance: 17.3 mL/min (A) (by C-G formula based on SCr of 5.56 mg/dL (H)).    Allergies  Allergen Reactions  . Cephalexin Other (See Comments)    Reaction unknown- Childhood allergy Tolerated Ceftriaxone in the past  . Sulfamethoxazole-Trimethoprim Other (See Comments)    Unknown reaction. Pt states that she was told by her mother that she had allergy to Bactrim as a child.    Antimicrobials this admission: Vancomycin 1/17>> * Doses: 2g LD on 1/17 * HD sessions: 1/18, 1/19  Zosyn 1/17>>  Microbiology results: 1/17 BCx >> ngtd 1/17 Peritoneal fluid >>ngtd 1/17 MRSA PCR >>negative  Thank you for allowing pharmacy to be a part of this patient's care.   Charlene Brooke, PharmD PGY1 Pharmacy Resident Phone: [phone] After 3:30PM please call [place] [phone] 06/15/2017 8:56 AM

## 2017-06-16 ENCOUNTER — Ambulatory Visit: Payer: Medicare Other | Admitting: Surgery

## 2017-06-16 DIAGNOSIS — W19XXXA Unspecified fall, initial encounter: Secondary | ICD-10-CM

## 2017-06-16 DIAGNOSIS — L89154 Pressure ulcer of sacral region, stage 4: Secondary | ICD-10-CM | POA: Diagnosis present

## 2017-06-16 DIAGNOSIS — T148XXD Other injury of unspecified body region, subsequent encounter: Secondary | ICD-10-CM

## 2017-06-16 DIAGNOSIS — M8668 Other chronic osteomyelitis, other site: Secondary | ICD-10-CM

## 2017-06-16 DIAGNOSIS — Z9181 History of falling: Secondary | ICD-10-CM

## 2017-06-16 LAB — CBC
HEMATOCRIT: 29.6 % — AB (ref 36.0–46.0)
HEMOGLOBIN: 9.1 g/dL — AB (ref 12.0–15.0)
MCH: 27.7 pg (ref 26.0–34.0)
MCHC: 30.7 g/dL (ref 30.0–36.0)
MCV: 90 fL (ref 78.0–100.0)
Platelets: 271 10*3/uL (ref 150–400)
RBC: 3.29 MIL/uL — ABNORMAL LOW (ref 3.87–5.11)
RDW: 16.2 % — AB (ref 11.5–15.5)
WBC: 9.9 10*3/uL (ref 4.0–10.5)

## 2017-06-16 LAB — GLUCOSE, CAPILLARY
GLUCOSE-CAPILLARY: 187 mg/dL — AB (ref 65–99)
GLUCOSE-CAPILLARY: 248 mg/dL — AB (ref 65–99)
Glucose-Capillary: 183 mg/dL — ABNORMAL HIGH (ref 65–99)

## 2017-06-16 LAB — RENAL FUNCTION PANEL
ALBUMIN: 1.6 g/dL — AB (ref 3.5–5.0)
ANION GAP: 16 — AB (ref 5–15)
BUN: 40 mg/dL — AB (ref 6–20)
CALCIUM: 8.5 mg/dL — AB (ref 8.9–10.3)
CO2: 26 mmol/L (ref 22–32)
Chloride: 95 mmol/L — ABNORMAL LOW (ref 101–111)
Creatinine, Ser: 5.24 mg/dL — ABNORMAL HIGH (ref 0.44–1.00)
GFR calc Af Amer: 11 mL/min — ABNORMAL LOW (ref >60)
GFR, EST NON AFRICAN AMERICAN: 9 mL/min — AB (ref >60)
Glucose, Bld: 231 mg/dL — ABNORMAL HIGH (ref 65–99)
PHOSPHORUS: 6.5 mg/dL — AB (ref 2.5–4.6)
POTASSIUM: 4.5 mmol/L (ref 3.5–5.1)
Sodium: 137 mmol/L (ref 135–145)

## 2017-06-16 LAB — SURGICAL PCR SCREEN
MRSA, PCR: NEGATIVE
Staphylococcus aureus: POSITIVE — AB

## 2017-06-16 MED ORDER — CHLORHEXIDINE GLUCONATE CLOTH 2 % EX PADS
6.0000 | MEDICATED_PAD | Freq: Every day | CUTANEOUS | Status: AC
  Administered 2017-06-17: 6 via TOPICAL

## 2017-06-16 MED ORDER — ACETAMINOPHEN 325 MG PO TABS
ORAL_TABLET | ORAL | Status: AC
  Filled 2017-06-16: qty 2

## 2017-06-16 MED ORDER — VANCOMYCIN HCL IN DEXTROSE 1-5 GM/200ML-% IV SOLN
INTRAVENOUS | Status: AC
  Filled 2017-06-16: qty 200

## 2017-06-16 MED ORDER — HYDRALAZINE HCL 20 MG/ML IJ SOLN
INTRAMUSCULAR | Status: AC
  Filled 2017-06-16: qty 1

## 2017-06-16 MED ORDER — MUPIROCIN 2 % EX OINT
1.0000 "application " | TOPICAL_OINTMENT | Freq: Two times a day (BID) | CUTANEOUS | Status: AC
  Administered 2017-06-17 – 2017-06-21 (×10): 1 via NASAL
  Filled 2017-06-16: qty 22

## 2017-06-16 NOTE — Progress Notes (Signed)
PROGRESS NOTE    [name]  XNT:[id number] DOB: 19-Jul-1975 DOA: 06/11/2017 PCP: Merrilee Seashore, MD   Brief Narrative:  42 year old female with history of ESRD on hemodialysis Monday Wednesday Friday, severe peripheral vascular disease, coronary artery disease, essential hypertension, recent cardiac arrest in November 2018 and had an extended stay due to ventilatory dependent respiratory failure.  Eventually patient was discharged to the long-term care facility and unfortunately had a fall over there therefore was sent to the hospital again.  CT in the ER showed 8 mm hyperdense lesion concerning for hemorrhagic mass and parietal area.  Case was discussed with neurosurgery who suggest getting an MRI on nonemergent basis as patient is at this time.  Unfortunately patient is unable to lay flat.  She underwent large volume paracentesis which has made her abdomen feel better.  It appears she also has necrotic/infected sacral wound and CT of the pelvis confirms she has osteomyelitis therefore is is getting IV antibiotics.  Patient has been followed by vascular surgery for her right lower extremity amputation now concerning for nonviable limb.  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 - Offloading as able - WOC consulted - Pain control  PVD with necrotic right foot wound s/p TMA: - VVS planning amputation this week.   - Due to patient's comorbidities patient remains moderate to high risk for any surgical procedure.  ESRD on hemodialysis:  - Continue MWF HD per  nephrology  Fall status post hemorrhagic mass: ~8 mm in size in the parietal lobe - Neurosurgery recommended MRI which the patient wishes to pursue as an outpatient due to pain laying flat.    Chronic systolic CHF: EF 96-75%. Euvolemic.  - Continue HD for fluid removal, also received paracentesis 1/18 for her ascites - Fluid restriction.   Anemia of chronic disease:Likely from her ESRD.  Hemoglobin is somewhat stable without any obvious signs of blood loss.  - Monitor, transfuse if <7 g/dl.   HTN - All of her home meds are resumed including labetalol, clonidine, Norvasc, hydralazine, immature. - IV hydralazine as needed.   DVT prophylaxis: SCDs Code Status: Full code Family Communication: None at bedside during HD Disposition Plan: Uncertain, pending clinical course.  Consultants:   Vascular surgery  Infectious disease  Antimicrobials:   Vancomycin 1/17  Zosyn 1/17  Subjective: Coming to terms with need for amputation, has support system in place. No other complaints.   Objective: Vitals:   06/16/17 1030 06/16/17 1100 06/16/17 1130 06/16/17 1148  BP: (!) 186/78 (!) 170/74 (!) 176/72 (!) 158/84  Pulse: 78 79 82 81  Resp: 18 18 19 18   Temp:    98 F (36.7 C)  TempSrc:    Oral  SpO2:    98%  Weight:      Height:        Intake/Output Summary (Last 24 hours) at 06/16/2017 1453 Last data filed at 06/16/2017 1148 Gross per 24 hour  Intake 240 ml  Output 4000 ml  Net -3760 ml   Filed Weights   06/13/17 1324 06/13/17 2121 06/14/17 0745  Weight:  85.2 kg (187 lb 13.3 oz) 92 kg (202 lb 13.2 oz) 92 kg (202 lb 13.2 oz)    Examination: Gen: Chronically ill-appearing 42 y.o.female in NAD in HD HEENT: MMM, posterior oropharynx clear Pulm: Non-labored; CTAB, no wheezes  CV: Regular rate, no murmur appreciated; distal pulses intact/symmetric GI: + BS; soft, non-tender, non-distended Skin: No rashes, wounds, ulcers Neuro: A&Ox3, CN II-XII without deficits Extremities:  Amputation of the right foot noted with clean surgical dressing in place.  Also clean dressing noted in the right groin  Data Reviewed:   CBC: Recent Labs  Lab 06/11/17 1757 06/12/17 0813 06/13/17 0915 06/16/17 0639  WBC 9.3 8.7 8.5 9.9  HGB 8.9* 8.9* 7.3* 9.1*  HCT 29.7* 28.3* 23.4* 29.6*  MCV 91.1 90.7 90.7 90.0  PLT 281 268 214 829   Basic Metabolic Panel: Recent Labs  Lab 06/11/17 1757 06/12/17 0813 06/13/17 0915 06/16/17 0639  NA 138 139 137 137  K 3.9 4.1 4.1 4.5  CL 96* 97* 96* 95*  CO2 27 28 26 26   GLUCOSE 132* 125* 213* 231*  BUN 29* 37* 48* 40*  CREATININE 3.77* 4.58* 5.56* 5.24*  CALCIUM 8.5* 8.7* 8.2* 8.5*  MG  --  1.7  --   --   PHOS  --   --  5.3* 6.5*   GFR: Estimated Creatinine Clearance: 18.4 mL/min (A) (by C-G formula based on SCr of 5.24 mg/dL (H)). Liver Function Tests: Recent Labs  Lab 06/13/17 0915 06/16/17 0639  AST 13*  --   ALT 11*  --   ALKPHOS 81  --   BILITOT 0.8  --   PROT 5.2*  --   ALBUMIN 1.8* 1.6*   No results for input(s): LIPASE, AMYLASE in the last 168 hours. No results for input(s): AMMONIA in the last 168 hours. Coagulation Profile: Recent Labs  Lab 06/11/17 1922  INR 1.20   Cardiac Enzymes: No results for input(s): CKTOTAL, CKMB, CKMBINDEX, TROPONINI in the last 168 hours. BNP (last 3 results) No results for input(s): PROBNP in the last 8760 hours. HbA1C: No results for input(s): HGBA1C in the last 72 hours. CBG: Recent Labs  Lab 06/15/17 0735 06/15/17 1147 06/15/17 1621 06/15/17 2103 06/16/17 1234  GLUCAP 348* 261* 174* 181* 183*   Lipid Profile: No results for input(s): CHOL, HDL, LDLCALC, TRIG, CHOLHDL, LDLDIRECT in the last 72 hours. Thyroid Function Tests: No results for input(s): TSH, T4TOTAL, FREET4, T3FREE, THYROIDAB in the last 72 hours. Anemia Panel: No results for input(s): VITAMINB12, FOLATE, FERRITIN, TIBC, IRON, RETICCTPCT in the last 72 hours. Sepsis Labs: Recent Labs  Lab  06/12/17 0813 06/13/17 0915 06/14/17 0458  PROCALCITON 0.82 0.70 0.81    Recent Results (from the past 240 hour(s))  Culture, blood (routine x 2)     Status: None (Preliminary result)   Collection Time: 06/12/17  9:30 AM  Result Value Ref Range Status   Specimen Description BLOOD RIGHT ANTECUBITAL  Final   Special Requests   Final    BOTTLES DRAWN AEROBIC AND ANAEROBIC Blood Culture adequate volume   Culture NO GROWTH 3 DAYS  Final   Report Status PENDING  Incomplete  Culture, blood (routine x 2)     Status: None (Preliminary result)   Collection Time: 06/12/17  9:45 AM  Result Value Ref Range Status   Specimen Description BLOOD RIGHT FOREARM  Final   Special Requests   Final    BOTTLES DRAWN AEROBIC AND ANAEROBIC Blood Culture adequate volume   Culture  NO GROWTH 3 DAYS  Final   Report Status PENDING  Incomplete  MRSA PCR Screening     Status: None   Collection Time: 06/12/17 10:45 AM  Result Value Ref Range Status   MRSA by PCR NEGATIVE NEGATIVE Final    Comment:        The GeneXpert MRSA Assay (FDA approved for NASAL specimens only), is one component of a comprehensive MRSA colonization surveillance program. It is not intended to diagnose MRSA infection nor to guide or monitor treatment for MRSA infections.   Gram stain     Status: None   Collection Time: 06/12/17  1:19 PM  Result Value Ref Range Status   Specimen Description FLUID  Final   Special Requests NONE  Final   Gram Stain NO WBC SEEN NO ORGANISMS SEEN   Final   Report Status 06/12/2017 FINAL  Final  Culture, body fluid-bottle     Status: None (Preliminary result)   Collection Time: 06/12/17  1:19 PM  Result Value Ref Range Status   Specimen Description FLUID PERITONEAL  Final   Special Requests BOTTLES DRAWN AEROBIC AND ANAEROBIC  Final   Culture NO GROWTH 3 DAYS  Final   Report Status PENDING  Incomplete         Radiology Studies: No results found.      Scheduled Meds: . amLODipine  10  mg Oral QHS  . atorvastatin  40 mg Oral q1800  . cloNIDine  0.3 mg Oral TID  . darbepoetin (ARANESP) injection - DIALYSIS  200 mcg Intravenous Q Fri-HD  . docusate  100 mg Oral TID  . feeding supplement (NEPRO CARB STEADY)  237 mL Oral BID BM  . feeding supplement (PRO-STAT SUGAR FREE 64)  30 mL Oral BID  . fentaNYL  25 mcg Transdermal Q72H  . insulin aspart  0-9 Units Subcutaneous TID WC  . insulin glargine  14 Units Subcutaneous QHS  . insulin glargine  20 Units Subcutaneous Daily  . isosorbide dinitrate  60 mg Oral BID  . labetalol  300 mg Oral TID  . metoCLOPramide  2.5 mg Oral TID AC  . multivitamin  1 tablet Oral QHS  . pantoprazole  40 mg Oral Daily  . sertraline  50 mg Oral Daily  . sevelamer carbonate  2,400 mg Oral TID WC   Continuous Infusions: . piperacillin-tazobactam (ZOSYN)  IV 3.375 g (06/16/17 1359)  . vancomycin Stopped (06/16/17 1148)     LOS: 5 days    Time spent: 25 mins  Vance Gather, MD [facility] Pager [phone]   If 7PM-7AM, please contact night-coverage [email or web address] Password [facility] 06/16/2017, 2:53 PM

## 2017-06-16 NOTE — Progress Notes (Signed)
[facility] Progress Note   Subjective:  Seen on HD, no c/o, no SOB  Objective Vitals:   06/16/17 1030 06/16/17 1100 06/16/17 1130 06/16/17 1148  BP: (!) 186/78 (!) 170/74 (!) 176/72 (!) 158/84  Pulse: 78 79 82 81  Resp: 18 18 19 18   Temp:    98 F (36.7 C)  TempSrc:    Oral  SpO2:    98%  Weight:      Height:       Physical Exam General: NAD, oriented x 3. Heart: RRR; 2/6 systolic murmur Lungs: CTAB decreased bs Extremities: Trace LE edema, bandaged R foot Dialysis Access: AVF ABD pos bs, nontender NF, Ox3  Additional Objective Labs: Basic Metabolic Panel: Recent Labs  Lab 06/12/17 0813 06/13/17 0915 06/16/17 0639  NA 139 137 137  K 4.1 4.1 4.5  CL 97* 96* 95*  CO2 28 26 26   GLUCOSE 125* 213* 231*  BUN 37* 48* 40*  CREATININE 4.58* 5.56* 5.24*  CALCIUM 8.7* 8.2* 8.5*  PHOS  --  5.3* 6.5*   Liver Function Tests: Recent Labs  Lab 06/13/17 0915 06/16/17 0639  AST 13*  --   ALT 11*  --   ALKPHOS 81  --   BILITOT 0.8  --   PROT 5.2*  --   ALBUMIN 1.8* 1.6*   CBC: Recent Labs  Lab 06/11/17 1757 06/12/17 0813 06/13/17 0915 06/16/17 0639  WBC 9.3 8.7 8.5 9.9  HGB 8.9* 8.9* 7.3* 9.1*  HCT 29.7* 28.3* 23.4* 29.6*  MCV 91.1 90.7 90.7 90.0  PLT 281 268 214 271   Blood Culture    Component Value Date/Time   SDES FLUID 06/12/2017 1319   SDES FLUID PERITONEAL 06/12/2017 1319   SPECREQUEST NONE 06/12/2017 1319   SPECREQUEST BOTTLES DRAWN AEROBIC AND ANAEROBIC 06/12/2017 1319   CULT NO GROWTH 3 DAYS 06/12/2017 1319   REPTSTATUS 06/12/2017 FINAL 06/12/2017 1319   REPTSTATUS PENDING 06/12/2017 1319    CBG: Recent Labs  Lab 06/14/17 2126 06/15/17 0735 06/15/17 1147 06/15/17 1621 06/15/17 2103  GLUCAP 341* 348* 261* 174* 181*   Medications: . piperacillin-tazobactam (ZOSYN)  IV 3.375 g (06/16/17 0214)  . vancomycin Stopped (06/16/17 1148)   . amLODipine  10 mg Oral QHS  . atorvastatin  40 mg Oral q1800  . cloNIDine  0.3 mg  Oral TID  . darbepoetin (ARANESP) injection - DIALYSIS  200 mcg Intravenous Q Fri-HD  . docusate  100 mg Oral TID  . feeding supplement (NEPRO CARB STEADY)  237 mL Oral BID BM  . feeding supplement (PRO-STAT SUGAR FREE 64)  30 mL Oral BID  . fentaNYL  25 mcg Transdermal Q72H  . insulin aspart  0-9 Units Subcutaneous TID WC  . insulin glargine  14 Units Subcutaneous QHS  . insulin glargine  20 Units Subcutaneous Daily  . isosorbide dinitrate  60 mg Oral BID  . labetalol  300 mg Oral TID  . metoCLOPramide  2.5 mg Oral TID AC  . multivitamin  1 tablet Oral QHS  . pantoprazole  40 mg Oral Daily  . sertraline  50 mg Oral Daily  . sevelamer carbonate  2,400 mg Oral TID WC    MWF NW 4h 80min  400/800  85.5kg   2/2.25 bath L AVF  P2   Hep 2000 - Mircera 227mcg IV q 2wks - supposed to start 06/12/17  Assessment: 1 Sacral decub 2 Wound infxn 3 ESRD HD MWF, vol^ 4 PAD 5 Fall / small  IC bleed 6 Anemia cont esa 7 DM 8 CAD 9 HTN severe, mult meds   P HD today, lower vol     Sol Blazing 06/16/2017, 12:35 PM

## 2017-06-16 NOTE — Progress Notes (Signed)
[place] for Infectious Disease  Date of Admission:  06/11/2017     Total days of antibiotics 5   Vancomycin Day 5  Zosyn Day 5   Reason for Visit: chronic wound   ID: [name] is a 42 y.o. female admitted from SNF after a fall with  Active Problems:   Sacral osteomyelitis ([place])   PAD (peripheral artery disease) ([place])   Ascites   Hypertension   Wound healing, delayed   End stage renal disease on dialysis [facility])   CAD (coronary artery disease)   Fluid overload   Brain mass   Fall  Interval HPI:  Afebrile since she was seen by ID service last. WBC normal. She just got back from HD session today.   Marland Kitchen amLODipine  10 mg Oral QHS  . atorvastatin  40 mg Oral q1800  . cloNIDine  0.3 mg Oral TID  . darbepoetin (ARANESP) injection - DIALYSIS  200 mcg Intravenous Q Fri-HD  . docusate  100 mg Oral TID  . feeding supplement (NEPRO CARB STEADY)  237 mL Oral BID BM  . feeding supplement (PRO-STAT SUGAR FREE 64)  30 mL Oral BID  . fentaNYL  25 mcg Transdermal Q72H  . insulin aspart  0-9 Units Subcutaneous TID WC  . insulin glargine  14 Units Subcutaneous QHS  . insulin glargine  20 Units Subcutaneous Daily  . isosorbide dinitrate  60 mg Oral BID  . labetalol  300 mg Oral TID  . metoCLOPramide  2.5 mg Oral TID AC  . multivitamin  1 tablet Oral QHS  . pantoprazole  40 mg Oral Daily  . sertraline  50 mg Oral Daily  . sevelamer carbonate  2,400 mg Oral TID WC    Allergies  Allergen Reactions  . Cephalexin Other (See Comments)    Reaction unknown- Childhood allergy Tolerated Ceftriaxone in the past  . Sulfamethoxazole-Trimethoprim Other (See Comments)    Unknown reaction. Pt states that she was told by her mother that she had allergy to Bactrim as a child.    OBJECTIVE: Vitals:   06/16/17 1030 06/16/17 1100 06/16/17 1130 06/16/17 1148  BP: (!) 186/78 (!) 170/74 (!) 176/72 (!) 158/84  Pulse: 78 79 82 81  Resp: 18 18 19 18   Temp:    98 F (36.7 C)    TempSrc:    Oral  SpO2:    98%  Weight:      Height:       Body mass index is 24.69 kg/m.  Physical Exam  Constitutional: She is oriented to person, place, and time. Vital signs are normal. She appears unhealthy.  HENT:  Mouth/Throat: Oropharynx is clear and moist. No oral lesions. No dental abscesses.  Eyes: Pupils are equal, round, and reactive to light. No scleral icterus.  Neck: Normal range of motion.  Cardiovascular: Normal rate, regular rhythm and normal heart sounds.  Pulmonary/Chest: Effort normal and breath sounds normal.  Abdominal: Soft. She exhibits no distension. There is no tenderness.  Musculoskeletal: Normal range of motion. She exhibits no tenderness.       Legs: Lymphadenopathy:    She has no cervical adenopathy.  Neurological: She is alert and oriented to person, place, and time.  Skin: Skin is warm and dry. No rash noted.  Psychiatric: Mood, affect and judgment normal.  Vitals reviewed.  Lab Results Lab Results  Component Value Date   WBC 9.9 06/16/2017   HGB 9.1 (L) 06/16/2017  HCT 29.6 (L) 06/16/2017   MCV 90.0 06/16/2017   PLT 271 06/16/2017    Lab Results  Component Value Date   CREATININE 5.24 (H) 06/16/2017   BUN 40 (H) 06/16/2017   NA 137 06/16/2017   K 4.5 06/16/2017   CL 95 (L) 06/16/2017   CO2 26 06/16/2017    Lab Results  Component Value Date   ALT 11 (L) 06/13/2017   AST 13 (L) 06/13/2017   ALKPHOS 81 06/13/2017   BILITOT 0.8 06/13/2017     Microbiology: Recent Results (from the past 240 hour(s))  Culture, blood (routine x 2)     Status: None (Preliminary result)   Collection Time: 06/12/17  9:30 AM  Result Value Ref Range Status   Specimen Description BLOOD RIGHT ANTECUBITAL  Final   Special Requests   Final    BOTTLES DRAWN AEROBIC AND ANAEROBIC Blood Culture adequate volume   Culture NO GROWTH 3 DAYS  Final   Report Status PENDING  Incomplete  Culture, blood (routine x 2)     Status: None (Preliminary result)    Collection Time: 06/12/17  9:45 AM  Result Value Ref Range Status   Specimen Description BLOOD RIGHT FOREARM  Final   Special Requests   Final    BOTTLES DRAWN AEROBIC AND ANAEROBIC Blood Culture adequate volume   Culture NO GROWTH 3 DAYS  Final   Report Status PENDING  Incomplete  MRSA PCR Screening     Status: None   Collection Time: 06/12/17 10:45 AM  Result Value Ref Range Status   MRSA by PCR NEGATIVE NEGATIVE Final    Comment:        The GeneXpert MRSA Assay (FDA approved for NASAL specimens only), is one component of a comprehensive MRSA colonization surveillance program. It is not intended to diagnose MRSA infection nor to guide or monitor treatment for MRSA infections.   Gram stain     Status: None   Collection Time: 06/12/17  1:19 PM  Result Value Ref Range Status   Specimen Description FLUID  Final   Special Requests NONE  Final   Gram Stain NO WBC SEEN NO ORGANISMS SEEN   Final   Report Status 06/12/2017 FINAL  Final  Culture, body fluid-bottle     Status: None (Preliminary result)   Collection Time: 06/12/17  1:19 PM  Result Value Ref Range Status   Specimen Description FLUID PERITONEAL  Final   Special Requests BOTTLES DRAWN AEROBIC AND ANAEROBIC  Final   Culture NO GROWTH 3 DAYS  Final   Report Status PENDING  Incomplete    IMAGING:  pelvic ct showing sacral wound going to coccyx with changes concerning for early osteomyelitis    ASSESSMENT: Chronic left ischial pressure wound Chronic right groin wound  Ischemic/necrotic right foot ESRD on HD    PLAN: 1. Plan for right BKA (possible AKA) this week per Dr. Trula Slade for ongoing ischemic/necrotic wound.  2. Continue vancomycin and zosyn for now until after her surgery tomorrow and will make further recs afterwards.  3. Agree with continuing santyl to right groin and ischial wound to debride prior to reapplication of negative pressure dressing. 4. Appreciate wound care recommendations as this will be  necessary going forward to help these sites heal.   [name], MSN, NP-C [place] [facility] [place]: [phone] Pager: [phone]  06/16/2017  2:29 PM

## 2017-06-17 ENCOUNTER — Inpatient Hospital Stay (HOSPITAL_COMMUNITY): Payer: Medicare Other | Admitting: Certified Registered"

## 2017-06-17 ENCOUNTER — Encounter (HOSPITAL_COMMUNITY): Payer: Self-pay

## 2017-06-17 ENCOUNTER — Encounter (HOSPITAL_COMMUNITY)
Admission: EM | Disposition: A | Discharge status: Rehabilitation Facility | Payer: Self-pay | Source: Home / Self Care | Attending: Family Medicine

## 2017-06-17 DIAGNOSIS — T8781 Dehiscence of amputation stump: Secondary | ICD-10-CM

## 2017-06-17 HISTORY — PX: AMPUTATION: SHX166

## 2017-06-17 LAB — GLUCOSE, CAPILLARY
GLUCOSE-CAPILLARY: 157 mg/dL — AB (ref 65–99)
GLUCOSE-CAPILLARY: 145 mg/dL — AB (ref 65–99)
Glucose-Capillary: 234 mg/dL — ABNORMAL HIGH (ref 65–99)
Glucose-Capillary: 70 mg/dL (ref 65–99)
Glucose-Capillary: 99 mg/dL (ref 65–99)
Glucose-Capillary: 86 mg/dL (ref 65–99)

## 2017-06-17 LAB — BASIC METABOLIC PANEL
Anion gap: 16 — ABNORMAL HIGH (ref 5–15)
BUN: 28 mg/dL — AB (ref 6–20)
CALCIUM: 8.3 mg/dL — AB (ref 8.9–10.3)
CHLORIDE: 97 mmol/L — AB (ref 101–111)
CO2: 22 mmol/L (ref 22–32)
CREATININE: 3.98 mg/dL — AB (ref 0.44–1.00)
GFR calc non Af Amer: 13 mL/min — ABNORMAL LOW (ref >60)
GFR, EST AFRICAN AMERICAN: 15 mL/min — AB (ref >60)
GLUCOSE: 258 mg/dL — AB (ref 65–99)
Potassium: 4.4 mmol/L (ref 3.5–5.1)
Sodium: 135 mmol/L (ref 135–145)

## 2017-06-17 LAB — CULTURE, BLOOD (ROUTINE X 2)
Culture: NO GROWTH
Culture: NO GROWTH
SPECIAL REQUESTS: ADEQUATE
SPECIAL REQUESTS: ADEQUATE

## 2017-06-17 LAB — CULTURE, BODY FLUID-BOTTLE: CULTURE: NO GROWTH

## 2017-06-17 LAB — CBC
HEMATOCRIT: 30.5 % — AB (ref 36.0–46.0)
HEMOGLOBIN: 9.2 g/dL — AB (ref 12.0–15.0)
MCH: 27.5 pg (ref 26.0–34.0)
MCHC: 30.2 g/dL (ref 30.0–36.0)
MCV: 91 fL (ref 78.0–100.0)
Platelets: 277 10*3/uL (ref 150–400)
RBC: 3.35 MIL/uL — ABNORMAL LOW (ref 3.87–5.11)
RDW: 16.5 % — AB (ref 11.5–15.5)
WBC: 10.9 10*3/uL — ABNORMAL HIGH (ref 4.0–10.5)

## 2017-06-17 LAB — PROTIME-INR
INR: 1.14
Prothrombin Time: 14.5 seconds (ref 11.4–15.2)

## 2017-06-17 LAB — CULTURE, BODY FLUID W GRAM STAIN -BOTTLE

## 2017-06-17 SURGERY — AMPUTATION BELOW KNEE
Anesthesia: Spinal | Site: Leg Lower | Laterality: Right

## 2017-06-17 MED ORDER — FENTANYL CITRATE (PF) 250 MCG/5ML IJ SOLN
INTRAMUSCULAR | Status: AC
  Filled 2017-06-17: qty 5

## 2017-06-17 MED ORDER — LABETALOL HCL 5 MG/ML IV SOLN: 10.0000 mg | INTRAVENOUS | Status: DC | PRN

## 2017-06-17 MED ORDER — OXYCODONE HCL 5 MG PO TABS: 5.0000 mg | ORAL_TABLET | Freq: Once | ORAL | Status: DC | PRN

## 2017-06-17 MED ORDER — MIDAZOLAM HCL 2 MG/2ML IJ SOLN
1.0000 mg | INTRAMUSCULAR | Status: AC | PRN
  Administered 2017-06-17 (×2): 1 mg via INTRAVENOUS

## 2017-06-17 MED ORDER — ONDANSETRON HCL 4 MG/2ML IJ SOLN: 4.0000 mg | Freq: Four times a day (QID) | INTRAMUSCULAR | Status: DC | PRN

## 2017-06-17 MED ORDER — ALUM & MAG HYDROXIDE-SIMETH 200-200-20 MG/5ML PO SUSP: 15.0000 mL | ORAL | Status: DC | PRN

## 2017-06-17 MED ORDER — FENTANYL CITRATE (PF) 100 MCG/2ML IJ SOLN
25.0000 ug | INTRAMUSCULAR | Status: DC | PRN
  Administered 2017-06-17 (×2): 50 ug via INTRAVENOUS

## 2017-06-17 MED ORDER — MORPHINE SULFATE (PF) 4 MG/ML IV SOLN
INTRAVENOUS | Status: AC
  Filled 2017-06-17: qty 1

## 2017-06-17 MED ORDER — SODIUM CHLORIDE 0.9 % IV SOLN
INTRAVENOUS | Status: DC
  Administered 2017-06-17: 12:00:00 via INTRAVENOUS

## 2017-06-17 MED ORDER — SODIUM CHLORIDE 0.9% FLUSH: 3.0000 mL | INTRAVENOUS | Status: DC | PRN

## 2017-06-17 MED ORDER — BUPIVACAINE IN DEXTROSE 0.75-8.25 % IT SOLN
INTRATHECAL | Status: DC | PRN
  Administered 2017-06-17: 12 mg via INTRATHECAL

## 2017-06-17 MED ORDER — 0.9 % SODIUM CHLORIDE (POUR BTL) OPTIME
TOPICAL | Status: DC | PRN
  Administered 2017-06-17: 1000 mL

## 2017-06-17 MED ORDER — DAKINS (1/4 STRENGTH) 0.125 % EX SOLN
Freq: Two times a day (BID) | CUTANEOUS | Status: AC
  Administered 2017-06-17 – 2017-06-20 (×4)
  Filled 2017-06-17: qty 473

## 2017-06-17 MED ORDER — LACTATED RINGERS IV SOLN: INTRAVENOUS | Status: DC | PRN

## 2017-06-17 MED ORDER — HYDROMORPHONE HCL 1 MG/ML IJ SOLN
INTRAMUSCULAR | Status: AC
  Administered 2017-06-17: 0.5 mg via INTRAVENOUS
  Filled 2017-06-17: qty 1

## 2017-06-17 MED ORDER — PHENOL 1.4 % MT LIQD: 1.0000 | OROMUCOSAL | Status: DC | PRN

## 2017-06-17 MED ORDER — SODIUM CHLORIDE 0.9% FLUSH
3.0000 mL | Freq: Two times a day (BID) | INTRAVENOUS | Status: DC
  Administered 2017-06-18 – 2017-06-21 (×6): 3 mL via INTRAVENOUS
  Administered 2017-06-22: 10 mL via INTRAVENOUS
  Administered 2017-06-23: 3 mL via INTRAVENOUS

## 2017-06-17 MED ORDER — PANTOPRAZOLE SODIUM 40 MG PO TBEC: 40.0000 mg | DELAYED_RELEASE_TABLET | Freq: Every day | ORAL | Status: DC

## 2017-06-17 MED ORDER — FENTANYL CITRATE (PF) 250 MCG/5ML IJ SOLN
INTRAMUSCULAR | Status: DC | PRN
  Administered 2017-06-17: 50 ug via INTRAVENOUS

## 2017-06-17 MED ORDER — MORPHINE SULFATE (PF) 2 MG/ML IV SOLN
2.0000 mg | INTRAVENOUS | Status: DC | PRN
  Administered 2017-06-17 – 2017-06-18 (×3): 2 mg via INTRAVENOUS
  Filled 2017-06-17 (×3): qty 1

## 2017-06-17 MED ORDER — HEPARIN SODIUM (PORCINE) 5000 UNIT/ML IJ SOLN
5000.0000 [IU] | Freq: Three times a day (TID) | INTRAMUSCULAR | Status: DC
  Administered 2017-06-17 – 2017-06-22 (×9): 5000 [IU] via SUBCUTANEOUS
  Filled 2017-06-17 (×13): qty 1

## 2017-06-17 MED ORDER — OXYCODONE HCL 5 MG PO TABS
5.0000 mg | ORAL_TABLET | ORAL | Status: DC | PRN
  Administered 2017-06-17 – 2017-06-18 (×2): 5 mg via ORAL
  Filled 2017-06-17: qty 1

## 2017-06-17 MED ORDER — FENTANYL CITRATE (PF) 100 MCG/2ML IJ SOLN
INTRAMUSCULAR | Status: AC
  Administered 2017-06-17: 50 ug via INTRAVENOUS
  Filled 2017-06-17: qty 2

## 2017-06-17 MED ORDER — MIDAZOLAM HCL 2 MG/2ML IJ SOLN
INTRAMUSCULAR | Status: AC
  Administered 2017-06-17: 1 mg via INTRAVENOUS
  Filled 2017-06-17: qty 2

## 2017-06-17 MED ORDER — ONDANSETRON HCL 4 MG/2ML IJ SOLN
INTRAMUSCULAR | Status: AC
  Filled 2017-06-17: qty 2

## 2017-06-17 MED ORDER — MIDAZOLAM HCL 2 MG/2ML IJ SOLN
INTRAMUSCULAR | Status: DC | PRN
  Administered 2017-06-17: 1 mg via INTRAVENOUS
  Administered 2017-06-17 (×2): 0.5 mg via INTRAVENOUS

## 2017-06-17 MED ORDER — SODIUM CHLORIDE 0.9 % IV SOLN: 250.0000 mL | INTRAVENOUS | Status: DC | PRN

## 2017-06-17 MED ORDER — HYDROMORPHONE HCL 1 MG/ML IJ SOLN
0.5000 mg | INTRAMUSCULAR | Status: AC | PRN
  Administered 2017-06-17 (×4): 0.5 mg via INTRAVENOUS

## 2017-06-17 MED ORDER — MIDAZOLAM HCL 2 MG/2ML IJ SOLN
INTRAMUSCULAR | Status: AC
  Filled 2017-06-17: qty 2

## 2017-06-17 MED ORDER — METOPROLOL TARTRATE 5 MG/5ML IV SOLN: 2.0000 mg | INTRAVENOUS | Status: DC | PRN

## 2017-06-17 MED ORDER — PROPOFOL 10 MG/ML IV BOLUS
INTRAVENOUS | Status: AC
  Filled 2017-06-17: qty 20

## 2017-06-17 MED ORDER — PROPOFOL 10 MG/ML IV BOLUS
INTRAVENOUS | Status: DC | PRN
  Administered 2017-06-17 (×2): 10 mg via INTRAVENOUS

## 2017-06-17 MED ORDER — ONDANSETRON HCL 4 MG/2ML IJ SOLN
INTRAMUSCULAR | Status: DC | PRN
  Administered 2017-06-17: 4 mg via INTRAVENOUS

## 2017-06-17 MED ORDER — OXYCODONE HCL 5 MG/5ML PO SOLN: 5.0000 mg | Freq: Once | ORAL | Status: DC | PRN

## 2017-06-17 MED ORDER — GUAIFENESIN-DM 100-10 MG/5ML PO SYRP: 15.0000 mL | ORAL_SOLUTION | ORAL | Status: DC | PRN

## 2017-06-17 MED ORDER — POTASSIUM CHLORIDE CRYS ER 20 MEQ PO TBCR: 20.0000 meq | EXTENDED_RELEASE_TABLET | Freq: Every day | ORAL | Status: DC | PRN

## 2017-06-17 MED ORDER — MORPHINE SULFATE (PF) 4 MG/ML IV SOLN
4.0000 mg | INTRAVENOUS | Status: AC
  Administered 2017-06-17: 4 mg via INTRAVENOUS

## 2017-06-17 MED ORDER — MAGNESIUM SULFATE 2 GM/50ML IV SOLN
2.0000 g | Freq: Every day | INTRAVENOUS | Status: DC | PRN
  Filled 2017-06-17: qty 50

## 2017-06-17 SURGICAL SUPPLY — 55 items
BANDAGE ACE 4X5 VEL STRL LF (GAUZE/BANDAGES/DRESSINGS) ×3 IMPLANT
BANDAGE ACE 6X5 VEL STRL LF (GAUZE/BANDAGES/DRESSINGS) ×3 IMPLANT
BANDAGE ELASTIC 4 VELCRO ST LF (GAUZE/BANDAGES/DRESSINGS) ×2 IMPLANT
BANDAGE ELASTIC 6 VELCRO ST LF (GAUZE/BANDAGES/DRESSINGS) ×2 IMPLANT
BANDAGE ESMARK 6X9 LF (GAUZE/BANDAGES/DRESSINGS) IMPLANT
BLADE SAW GIGLI 510 (BLADE) ×2 IMPLANT
BLADE SAW GIGLI 510MM (BLADE) ×1
BNDG CMPR 9X6 STRL LF SNTH (GAUZE/BANDAGES/DRESSINGS)
BNDG COHESIVE 6X5 TAN STRL LF (GAUZE/BANDAGES/DRESSINGS) ×3 IMPLANT
BNDG ESMARK 6X9 LF (GAUZE/BANDAGES/DRESSINGS)
BNDG GAUZE ELAST 4 BULKY (GAUZE/BANDAGES/DRESSINGS) ×2 IMPLANT
CANISTER SUCT 3000ML PPV (MISCELLANEOUS) ×3 IMPLANT
CLIP VESOCCLUDE MED 6/CT (CLIP) IMPLANT
COVER SURGICAL LIGHT HANDLE (MISCELLANEOUS) ×3 IMPLANT
CUFF TOURNIQUET SINGLE 34IN LL (TOURNIQUET CUFF) IMPLANT
CUFF TOURNIQUET SINGLE 44IN (TOURNIQUET CUFF) IMPLANT
DRAIN CHANNEL 19F RND (DRAIN) IMPLANT
DRAPE HALF SHEET 40X57 (DRAPES) ×3 IMPLANT
DRAPE ORTHO SPLIT 77X108 STRL (DRAPES) ×6
DRAPE SURG ORHT 6 SPLT 77X108 (DRAPES) ×2 IMPLANT
DRSG ADAPTIC 3X8 NADH LF (GAUZE/BANDAGES/DRESSINGS) ×3 IMPLANT
ELECT REM PT RETURN 9FT ADLT (ELECTROSURGICAL) ×3
ELECTRODE REM PT RTRN 9FT ADLT (ELECTROSURGICAL) ×1 IMPLANT
EVACUATOR SILICONE 100CC (DRAIN) IMPLANT
GAUZE SPONGE 4X4 12PLY STRL (GAUZE/BANDAGES/DRESSINGS) ×3 IMPLANT
GAUZE SPONGE 4X4 16PLY XRAY LF (GAUZE/BANDAGES/DRESSINGS) ×3 IMPLANT
GLOVE BIOGEL PI IND STRL 7.5 (GLOVE) ×1 IMPLANT
GLOVE BIOGEL PI INDICATOR 7.5 (GLOVE) ×2
GLOVE SURG SS PI 7.5 STRL IVOR (GLOVE) ×3 IMPLANT
GOWN STRL REUS W/ TWL LRG LVL3 (GOWN DISPOSABLE) ×2 IMPLANT
GOWN STRL REUS W/ TWL XL LVL3 (GOWN DISPOSABLE) ×1 IMPLANT
GOWN STRL REUS W/TWL LRG LVL3 (GOWN DISPOSABLE) ×6
GOWN STRL REUS W/TWL XL LVL3 (GOWN DISPOSABLE) ×3
KIT BASIN OR (CUSTOM PROCEDURE TRAY) ×3 IMPLANT
KIT ROOM TURNOVER OR (KITS) ×3 IMPLANT
NS IRRIG 1000ML POUR BTL (IV SOLUTION) ×3 IMPLANT
PACK GENERAL/GYN (CUSTOM PROCEDURE TRAY) ×3 IMPLANT
PAD ABD 8X10 STRL (GAUZE/BANDAGES/DRESSINGS) ×2 IMPLANT
PAD ARMBOARD 7.5X6 YLW CONV (MISCELLANEOUS) ×6 IMPLANT
STAPLER VISISTAT 35W (STAPLE) ×3 IMPLANT
STOCKINETTE IMPERVIOUS LG (DRAPES) ×3 IMPLANT
SUT BONE WAX W31G (SUTURE) IMPLANT
SUT ETHILON 3 0 PS 1 (SUTURE) IMPLANT
SUT SILK 0 TIES 10X30 (SUTURE) ×3 IMPLANT
SUT SILK 2 0 (SUTURE)
SUT SILK 2 0 SH CR/8 (SUTURE) ×3 IMPLANT
SUT SILK 2-0 18XBRD TIE 12 (SUTURE) IMPLANT
SUT SILK 3 0 (SUTURE)
SUT SILK 3-0 18XBRD TIE 12 (SUTURE) IMPLANT
SUT VIC AB 2-0 CT1 18 (SUTURE) ×8 IMPLANT
SUT VIC AB 4-0 PS2 18 (SUTURE) ×4 IMPLANT
TAPE UMBILICAL COTTON 1/8X30 (MISCELLANEOUS) IMPLANT
TOWEL GREEN STERILE (TOWEL DISPOSABLE) ×6 IMPLANT
UNDERPAD 30X30 (UNDERPADS AND DIAPERS) ×3 IMPLANT
WATER STERILE IRR 1000ML POUR (IV SOLUTION) ×3 IMPLANT

## 2017-06-17 NOTE — Anesthesia Preprocedure Evaluation (Signed)
Anesthesia Evaluation  Patient identified by MRN, date of birth, ID band Patient awake    Reviewed: Allergy & Precautions, H&P , NPO status , Patient's Chart, lab work & pertinent test results  Airway Mallampati: II   Neck ROM: full    Dental   Pulmonary former smoker,    breath sounds clear to auscultation       Cardiovascular hypertension, + CAD, + Peripheral Vascular Disease and +CHF   Rhythm:regular Rate:Normal  EF 30-35%. LVH.  Pt had cardiac arrest in OR November 2018.  ACLS implemented and recovered.   Neuro/Psych  Headaches, Seizures -,   Neuromuscular disease    GI/Hepatic   Endo/Other  diabetes, Type 2  Renal/GU ESRF and DialysisRenal disease     Musculoskeletal  (+) Arthritis ,   Abdominal   Peds  Hematology   Anesthesia Other Findings   Reproductive/Obstetrics                             Anesthesia Physical Anesthesia Plan  ASA: III  Anesthesia Plan: Spinal   Post-op Pain Management:    Induction: Intravenous  PONV Risk Score and Plan: 2 and Midazolam and Treatment may vary due to age or medical condition  Airway Management Planned: Simple Face Mask  Additional Equipment:   Intra-op Plan:   Post-operative Plan:   Informed Consent: I have reviewed the patients History and Physical, chart, labs and discussed the procedure including the risks, benefits and alternatives for the proposed anesthesia with the patient or authorized representative who has indicated his/her understanding and acceptance.     Plan Discussed with: CRNA, Anesthesiologist and Surgeon  Anesthesia Plan Comments:         Anesthesia Quick Evaluation

## 2017-06-17 NOTE — Progress Notes (Signed)
Pharmacy Antibiotic Note  [name] is a 42 y.o. female admitted on 06/11/2017 with poorly healing R groin wound and sacral wound osteomyelitis.  Pharmacy has been consulted for vancomycin and zosyn dosing. Patient with ESRD on HD MWF.  The patient was noted to go for a BKA today and has received a dose of Vancomycin 1g with this procedure. Will plan to hold Vancomycin with HD scheduled for 1/23 and consider a VR on 1/24 AM or pre-HD level on 1/25 for monitoring purposes.   Plan: - Hold Vancomycin for now - Continue Zosyn 3.375 gram IV every 12 hours (4-hour infusion) - Will plan to check a Vancomycin random level on either 1/24 or 1/25 and re-dose at that time - Will continue to follow HD schedule/duration, culture results, LOT, and antibiotic de-escalation plans   Height: 6\' 4"  (193 cm) Weight: (Bed scale broken) IBW/kg (Calculated) : 82.3  Temp (24hrs), Avg:98.5 F (36.9 C), Min:97.9 F (36.6 C), Max:99.1 F (37.3 C)  Recent Labs  Lab 06/11/17 1757 06/12/17 0813 06/13/17 0915 06/16/17 0639 06/17/17 0241  WBC 9.3 8.7 8.5 9.9 10.9*  CREATININE 3.77* 4.58* 5.56* 5.24* 3.98*    Estimated Creatinine Clearance: 24.2 mL/min (A) (by C-G formula based on SCr of 3.98 mg/dL (H)).    Allergies  Allergen Reactions  . Cephalexin Other (See Comments)    Reaction unknown- Childhood allergy Tolerated Ceftriaxone in the past  . Sulfamethoxazole-Trimethoprim Other (See Comments)    Unknown reaction. Pt states that she was told by her mother that she had allergy to Bactrim as a child.    Antimicrobials this admission: Vancomycin 1/17>> * Doses: 2g LD on 1/17, 1g (1/18 @ 1200, 1/19 @ 1410, 1/21 @ 1048) * HD sessions: 1/18 (4.25 hr BFR 400 end 1315), 1/19 (4 hr BFR 400, end 1130), 1/21 (4 hr BFR 400, end 1130) Zosyn 1/17>>  Microbiology results: 1/17 BCx >> ngtd 1/17 Peritoneal fluid >>ngtd 1/17 MRSA PCR >>negative   Thank you for allowing pharmacy to be a part of this  patient's care.  Alycia Rossetti, PharmD, BCPS Clinical Pharmacist Pager: [phone] Clinical phone for 06/17/2017 from 7a-3:30p: [phone] If after 3:30p, please call main pharmacy at: [phone] 06/17/2017 3:58 PM

## 2017-06-17 NOTE — Progress Notes (Signed)
[facility] Progress Note   Subjective:   Patient seen at bedside in PACU post surgery. C/os pain.  Objective Vitals:   06/17/17 1619 06/17/17 1635 06/17/17 1640 06/17/17 1649  BP: (!) 213/97 (!) 202/89 (!) 189/95 (!) 190/94  Pulse: 78  80 77  Resp: 17 12 10 13   Temp:    97.6 F (36.4 C)  TempSrc:      SpO2: 100%  92% 94%  Weight:      Height:       Physical Exam General: NAD, chronically ill appearing, in pain Heart:RRR, 2/6 systolic murmur Lungs:CTAB, decreased bs Abdomen: distended, +BS, NT Extremities:LLE: no edema, +vascular changes, dusky in appearance RLE: s/p BKA, wrapped Dialysis Access: L AVF +b/t  Filed Weights   06/14/17 0745  Weight: 92 kg (202 lb 13.2 oz)    Intake/Output Summary (Last 24 hours) at 06/17/2017 1658 Last data filed at 06/17/2017 1300 Gross per 24 hour  Intake 470 ml  Output 30 ml  Net 440 ml    Additional Objective Labs: Basic Metabolic Panel: Recent Labs  Lab 06/13/17 0915 06/16/17 0639 06/17/17 0241  NA 137 137 135  K 4.1 4.5 4.4  CL 96* 95* 97*  CO2 26 26 22   GLUCOSE 213* 231* 258*  BUN 48* 40* 28*  CREATININE 5.56* 5.24* 3.98*  CALCIUM 8.2* 8.5* 8.3*  PHOS 5.3* 6.5*  --    Liver Function Tests: Recent Labs  Lab 06/13/17 0915 06/16/17 0639  AST 13*  --   ALT 11*  --   ALKPHOS 81  --   BILITOT 0.8  --   PROT 5.2*  --   ALBUMIN 1.8* 1.6*   CBC: Recent Labs  Lab 06/11/17 1757 06/12/17 0813 06/13/17 0915 06/16/17 0639 06/17/17 0241  WBC 9.3 8.7 8.5 9.9 10.9*  HGB 8.9* 8.9* 7.3* 9.1* 9.2*  HCT 29.7* 28.3* 23.4* 29.6* 30.5*  MCV 91.1 90.7 90.7 90.0 91.0  PLT 281 268 214 271 277   Blood Culture    Component Value Date/Time   SDES FLUID 06/12/2017 1319   SDES FLUID PERITONEAL 06/12/2017 1319   SPECREQUEST NONE 06/12/2017 1319   SPECREQUEST BOTTLES DRAWN AEROBIC AND ANAEROBIC 06/12/2017 1319   CULT NO GROWTH 5 DAYS 06/12/2017 1319   REPTSTATUS 06/12/2017 FINAL 06/12/2017 1319   REPTSTATUS  06/17/2017 FINAL 06/12/2017 1319   CBG: Recent Labs  Lab 06/16/17 1647 06/16/17 2051 06/17/17 0726 06/17/17 1047 06/17/17 1350  GLUCAP 187* 248* 234* 157* 145*   Lab Results  Component Value Date   INR 1.14 06/17/2017   INR 1.20 06/11/2017   INR 1.19 05/09/2017   Studies/Results: No results found.  Medications: . sodium chloride 10 mL/hr at 06/17/17 1146  . [MAR Hold] piperacillin-tazobactam (ZOSYN)  IV Stopped (06/17/17 0535)  . [MAR Hold] vancomycin 0 mg (06/16/17 1148)   . [MAR Hold] amLODipine  10 mg Oral QHS  . [MAR Hold] atorvastatin  40 mg Oral q1800  . [MAR Hold] Chlorhexidine Gluconate Cloth  6 each Topical Daily  . [MAR Hold] cloNIDine  0.3 mg Oral TID  . [MAR Hold] darbepoetin (ARANESP) injection - DIALYSIS  200 mcg Intravenous Q Fri-HD  . [MAR Hold] docusate  100 mg Oral TID  . [MAR Hold] feeding supplement (NEPRO CARB STEADY)  237 mL Oral BID BM  . [MAR Hold] feeding supplement (PRO-STAT SUGAR FREE 64)  30 mL Oral BID  . [MAR Hold] fentaNYL  25 mcg Transdermal Q72H  . [MAR Hold] insulin aspart  0-9 Units Subcutaneous TID WC  . [MAR Hold] insulin glargine  14 Units Subcutaneous QHS  . [MAR Hold] insulin glargine  20 Units Subcutaneous Daily  . [MAR Hold] isosorbide dinitrate  60 mg Oral BID  . [MAR Hold] labetalol  300 mg Oral TID  . [MAR Hold] metoCLOPramide  2.5 mg Oral TID AC  . [MAR Hold] multivitamin  1 tablet Oral QHS  . [MAR Hold] mupirocin ointment  1 application Nasal BID  . [MAR Hold] pantoprazole  40 mg Oral Daily  . [MAR Hold] sertraline  50 mg Oral Daily  . [MAR Hold] sevelamer carbonate  2,400 mg Oral TID WC    Dialysis Orders: MWF NW 4h 53min  400/800  85.5kg   2/2.25 bath L AVF  P2   Hep 2000 - Mircera 218mcg IV q 2wks - supposed to start 06/12/17   Assessment/Plan: 1. Sacral decub - per primary 2. Non healing R foot - s/p BKA today 1/22 Dr. Trula Slade. On ABX 3. Fall/small IC bleed - per primary 4. Ascites - s/p large vol  paracentesis. Abd distended. Per primary 5. ESRD - HD per regular MWF schedule.  6. Anemia of CKD- Hgb 9.2, Aranesp 234mcg qwk started 1/18 7. Secondary hyperparathyroidism - Ca in goal. P elevated. Continue binders.  8. HTN/volume - BP elevated, continue home meds.  Volume ^, titrate down as tolerated. Doubt wts are accurate 9. Nutrition - Alb 1.6, Renavite, Renal diet with fluid restrictions.  10. CAD   Jen Mow, PA-C [facility] Pager: [phone] 06/17/2017,4:58 PM  LOS: 6 days   Pt seen, examined, agree w assess/plan as above with additions as indicated.  Kelly Splinter MD [employer] pager [phone]    cell [phone] 06/17/2017, 5:17 PM

## 2017-06-17 NOTE — Progress Notes (Signed)
PROGRESS NOTE    [name]  VOH:[id number] DOB: 1975-10-25 DOA: 06/11/2017 PCP: Merrilee Seashore, MD   Brief Narrative:  42 year old female with history of ESRD on hemodialysis Monday Wednesday Friday, severe peripheral vascular disease, coronary artery disease, essential hypertension, recent cardiac arrest in November 2018 and had an extended stay due to ventilatory dependent respiratory failure.  Eventually patient was discharged to the long-term care facility and unfortunately had a fall over there therefore was sent to the hospital again.  CT in the ER showed 8 mm hyperdense lesion concerning for hemorrhagic mass and parietal area.  Case was discussed with neurosurgery who suggest getting an MRI on nonemergent basis as patient is at this time.  Unfortunately patient is unable to lay flat.  She underwent large volume paracentesis which has made her abdomen feel better.  It appears she also has necrotic/infected sacral wound and CT of the pelvis confirms she has osteomyelitis therefore is is getting IV antibiotics.  Patient has been followed by vascular surgery who plans right BKA 06/17/2017.  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 - Offloading as able - WOC consulted - Pain control  PVD with necrotic right foot wound s/p TMA: - VVS planning amputation today - Due to patient's comorbidities patient remains moderate to high risk for any surgical procedure.  ESRD on hemodialysis:  - Continue MWF HD per nephrology  Fall  status post hemorrhagic mass: ~8 mm in size in the parietal lobe - Neurosurgery recommended MRI which the patient wishes to pursue as an outpatient due to pain laying flat.    Chronic systolic CHF: EF 69-48%. Euvolemic.  - Continue HD for fluid removal, also received paracentesis 1/18 for ascites - Fluid restriction.   Anemia of chronic disease: Likely from her ESRD. Hgb stable without any obvious signs of blood loss.  - Monitor post op, transfuse if <7 g/dl.   HTN - All of her home meds are resumed including labetalol, clonidine, Norvasc, hydralazine, imdur. - IV hydralazine as needed.   DVT prophylaxis: SCDs Code Status: Full code Family Communication: Daughter at bedside this AM Disposition Plan: Uncertain, pending postoperative course  Consultants:   Vascular surgery  Infectious disease  Antimicrobials:   Vancomycin 1/17  Zosyn 1/17  Subjective: Couldn't sleep last night due to thinking about amputation. Family in room, supportive. No fevers or other complaints. Pain controlled.   Objective: Vitals:   06/16/17 1650 06/16/17 2055 06/17/17 0540 06/17/17 0730  BP: (!) 162/77 (!) 189/77 (!) 184/69 (!) 184/74  Pulse: 79 83 83 81  Resp: 18 17 18 18   Temp: 98.5 F (36.9 C) 99.1 F (37.3 C) 98.3 F (36.8 C) 98.9 F (37.2 C)  TempSrc: Oral Oral Oral Oral  SpO2: 100% 100% 94% 99%  Weight:      Height:        Intake/Output Summary (Last 24 hours) at 06/17/2017 1310 Last data filed at 06/17/2017 1300 Gross per 24 hour  Intake 470 ml  Output 30 ml  Net 440 ml  Filed Weights   06/14/17 0745  Weight: 92 kg (202 lb 13.2 oz)    Examination: Gen: Chronically ill-appearing 42 y.o.female in NAD HEENT: MMM, posterior oropharynx clear Pulm: Non-labored; CTAB, no wheezes  CV: Regular rate, no murmur appreciated; distal pulses intact/symmetric GI: + BS; soft, non-tender, non-distended Skin: As below Neuro: A&Ox3, CN II-XII without deficits Extremities: Amputation of  the right foot noted with clean surgical dressing in place.  Also clean dressing noted in the right groin  CBC: Recent Labs  Lab 06/11/17 1757 06/12/17 0813 06/13/17 0915 06/16/17 0639 06/17/17 0241  WBC 9.3 8.7 8.5 9.9 10.9*  HGB 8.9* 8.9* 7.3* 9.1* 9.2*  HCT 29.7* 28.3* 23.4* 29.6* 30.5*  MCV 91.1 90.7 90.7 90.0 91.0  PLT 281 268 214 271 841   Basic Metabolic Panel: Recent Labs  Lab 06/11/17 1757 06/12/17 0813 06/13/17 0915 06/16/17 0639 06/17/17 0241  NA 138 139 137 137 135  K 3.9 4.1 4.1 4.5 4.4  CL 96* 97* 96* 95* 97*  CO2 27 28 26 26 22   GLUCOSE 132* 125* 213* 231* 258*  BUN 29* 37* 48* 40* 28*  CREATININE 3.77* 4.58* 5.56* 5.24* 3.98*  CALCIUM 8.5* 8.7* 8.2* 8.5* 8.3*  MG  --  1.7  --   --   --   PHOS  --   --  5.3* 6.5*  --    GFR: Estimated Creatinine Clearance: 24.2 mL/min (A) (by C-G formula based on SCr of 3.98 mg/dL (H)).   Liver Function Tests: Recent Labs  Lab 06/13/17 0915 06/16/17 0639  AST 13*  --   ALT 11*  --   ALKPHOS 81  --   BILITOT 0.8  --   PROT 5.2*  --   ALBUMIN 1.8* 1.6*   Coagulation Profile: Recent Labs  Lab 06/11/17 1922 06/17/17 0241  INR 1.20 1.14     LOS: 6 days    Time spent: 25 mins  Vance Gather, MD [facility] Pager [phone]   If 7PM-7AM, please contact night-coverage [email or web address] Password [email or web address] 06/17/2017, 1:10 PM

## 2017-06-17 NOTE — Care Management Important Message (Signed)
Important Message  Patient Details  Name: [name] MRN: [id number] Date of Birth: [birth year]   Medicare Important Message Given:  Yes    Tiawana Forgy 06/17/2017, 3:28 PM

## 2017-06-17 NOTE — Op Note (Signed)
    Patient name: [name] MRN: [id number] DOB: November 18, 1975 Sex: female  06/17/2017 Pre-operative Diagnosis: Nonhealing right transmetatarsal amputation Post-operative diagnosis:  Same Surgeon:  [name] Assistants:   Gerri Lins Procedure:   Right below-knee amputation Anesthesia: Spinal Blood Loss:  < 100 cc Specimens: Left leg  Findings: Healthy appearing muscle  Indications: The patient has a nonhealing right transmetatarsal amputation site.  She has failed conservative measures and is now presenting for left knee amputation  Procedure:  The patient was identified in the holding area and taken to [place] 12  The patient was then placed supine on the table. spinal anesthesia was administered.  The patient was prepped and draped in the usual sterile fashion.  A time out was called and antibiotics were administered.  A two thirds one third posterior flap incision was initiated approximately 12 cm distal to the tibial tuberosity.  Cautery was used to divide subcutaneous tissue and fascia.  The tibia and fibula were then circumferentially exposed.  A periosteal elevator was used to elevate the periosteum.  A Gigli saw was then used to transect the tibia, beveling the anterior surface.  A large bone cutter was then used to transect the fibula proximal to the cut edge of the tibia.  I then proceeded with cautery to divide the remaining subcutaneous tissue and muscle.  The anterior tibial neurovascular bundle was ligated proximal to the cut edge of the bone.  I then isolated the popliteal neurovascular bundle and divided it between [facility] clamps.  The remaining tissue was removed as was the leg as a specimen.  I then individually isolated the popliteal artery nerve and vein and individually ligated them proximal to the cut edge of the tibia.  The wound was then copiously irrigated.  The muscle all responded to cautery.  There was minimal edema.  Once I was satisfied with hemostasis, the  fascia was reapproximated with interrupted 2-0 Vicryl.  The skin was closed with running 4-0 Vicryl.  Sterile dressings were applied.  There were no immediate complications.   Disposition: To PACU stable   V. [name], M.D. [facility] Office: [phone] Pager:  [phone]

## 2017-06-17 NOTE — Progress Notes (Signed)
Inpatient Diabetes Program Recommendations  AACE/ADA: New Consensus Statement on Inpatient Glycemic Control (2015)  Target Ranges:  Prepandial:   less than 140 mg/dL      Peak postprandial:   less than 180 mg/dL (1-2 hours)      Critically ill patients:  140 - 180 mg/dL   Lab Results  Component Value Date   GLUCAP 234 (H) 06/17/2017   HGBA1C 5.4 06/12/2017    Review of Glycemic Control  Results for LYRIS, HITCHMAN (MRN [id number]) as of 06/17/2017 09:30  Ref. Range 06/15/2017 21:03 06/16/2017 12:34 06/16/2017 16:47 06/16/2017 20:51 06/17/2017 07:26  Glucose-Capillary Latest Ref Range: 65 - 99 mg/dL 181 (H) 183 (H) 187 (H) 248 (H) 234 (H)   Diabetes history: Type 2  Outpatient Diabetes medications: Lantus 14 units qhs, Lantus 20 units qam  Current orders for Inpatient glycemic control: Lantus 20 units qam, Lantus 14 units qpm and Novolog 0-9 units tid  Inpatient Diabetes Program Recommendations: Anticipate CBG may be elevated after surgery. Based on renal function- do not over correct with Novolog or the patient will go very low- consider checking CBG during surgery and changing Novolog 0-9 units to q4h until tomorrow am.   Gentry Fitz, RN, BA, MHA, CDE Diabetes Coordinator Inpatient Diabetes Program  [phone] (Team Pager) [phone] ([place]) 06/17/2017 10:00 AM

## 2017-06-17 NOTE — Transfer of Care (Signed)
Immediate Anesthesia Transfer of Care Note  Patient: VERNE COVE  Procedure(s) Performed: AMPUTATION BELOW KNEE RIGHT (Right Leg Lower)  Patient Location: PACU  Anesthesia Type:Spinal  Level of Consciousness: awake, alert  and oriented  Airway & Oxygen Therapy: Patient Spontanous Breathing  Post-op Assessment: Report given to RN and Post -op Vital signs reviewed and stable  Post vital signs: Reviewed and stable  Last Vitals:  Vitals:   06/17/17 0540 06/17/17 0730  BP: (!) 184/69 (!) 184/74  Pulse: 83 81  Resp: 18 18  Temp: 36.8 C 37.2 C  SpO2: 94% 99%    Last Pain:  Vitals:   06/17/17 0951  TempSrc:   PainSc: 10-Worst pain ever      Patients Stated Pain Goal: 4 (76/19/50 9326)  Complications: No apparent anesthesia complications

## 2017-06-17 NOTE — Interval H&P Note (Signed)
History and Physical Interval Note:  06/17/2017 12:05 PM  [name]  has presented today for surgery, with the diagnosis of NON HEALING WOUND RIGHT FOOT  The various methods of treatment have been discussed with the patient and family. After consideration of risks, benefits and other options for treatment, the patient has consented to  Procedure(s): AMPUTATION BELOW KNEE RIGHT (Right) as a surgical intervention .  The patient's history has been reviewed, patient examined, no change in status, stable for surgery.  I have reviewed the patient's chart and labs.  Questions were answered to the patient's satisfaction.     [name]

## 2017-06-17 NOTE — Anesthesia Procedure Notes (Signed)
Spinal  Patient location during procedure: OR Start time: 06/17/2017 12:19 PM End time: 06/17/2017 12:28 PM Staffing Anesthesiologist: Duane Boston, MD Performed: anesthesiologist  Preanesthetic Checklist Completed: patient identified, surgical consent, pre-op evaluation, timeout performed, IV checked, risks and benefits discussed and monitors and equipment checked Spinal Block Patient position: sitting Prep: DuraPrep Patient monitoring: cardiac monitor, continuous pulse ox and blood pressure Approach: midline Location: L2-3 Injection technique: single-shot Needle Needle type: Pencan  Needle gauge: 24 G Needle length: 9 cm Additional Notes Functioning IV was confirmed and monitors were applied. Sterile prep and drape, including hand hygiene and sterile gloves were used. The patient was positioned and the spine was prepped. The skin was anesthetized with lidocaine.  Free flow of clear CSF was obtained prior to injecting local anesthetic into the CSF.  The spinal needle aspirated freely following injection.  The needle was carefully withdrawn.  The patient tolerated the procedure well.

## 2017-06-17 NOTE — Anesthesia Procedure Notes (Signed)
Procedure Name: MAC Date/Time: 06/17/2017 12:38 PM Performed by: Teressa Lower., CRNA Pre-anesthesia Checklist: Patient identified, Emergency Drugs available, Suction available, Patient being monitored and Timeout performed Patient Re-evaluated:Patient Re-evaluated prior to induction Oxygen Delivery Method: Simple face mask

## 2017-06-18 ENCOUNTER — Encounter (HOSPITAL_COMMUNITY): Payer: Self-pay | Admitting: Surgery

## 2017-06-18 ENCOUNTER — Inpatient Hospital Stay (HOSPITAL_COMMUNITY): Payer: Medicare Other

## 2017-06-18 DIAGNOSIS — Z7682 Awaiting organ transplant status: Secondary | ICD-10-CM | POA: Diagnosis not present

## 2017-06-18 DIAGNOSIS — S31109D Unspecified open wound of abdominal wall, unspecified quadrant without penetration into peritoneal cavity, subsequent encounter: Secondary | ICD-10-CM

## 2017-06-18 DIAGNOSIS — Z01818 Encounter for other preprocedural examination: Secondary | ICD-10-CM | POA: Diagnosis not present

## 2017-06-18 DIAGNOSIS — L089 Local infection of the skin and subcutaneous tissue, unspecified: Secondary | ICD-10-CM

## 2017-06-18 DIAGNOSIS — X58XXXD Exposure to other specified factors, subsequent encounter: Secondary | ICD-10-CM

## 2017-06-18 DIAGNOSIS — Z89511 Acquired absence of right leg below knee: Secondary | ICD-10-CM

## 2017-06-18 DIAGNOSIS — Z978 Presence of other specified devices: Secondary | ICD-10-CM

## 2017-06-18 DIAGNOSIS — L8990 Pressure ulcer of unspecified site, unspecified stage: Secondary | ICD-10-CM

## 2017-06-18 LAB — CBC
HCT: 27.7 % — ABNORMAL LOW (ref 36.0–46.0)
Hemoglobin: 8.5 g/dL — ABNORMAL LOW (ref 12.0–15.0)
MCH: 27.5 pg (ref 26.0–34.0)
MCHC: 30.7 g/dL (ref 30.0–36.0)
MCV: 89.6 fL (ref 78.0–100.0)
PLATELETS: 265 10*3/uL (ref 150–400)
RBC: 3.09 MIL/uL — ABNORMAL LOW (ref 3.87–5.11)
RDW: 16.6 % — AB (ref 11.5–15.5)
WBC: 11.7 10*3/uL — ABNORMAL HIGH (ref 4.0–10.5)

## 2017-06-18 LAB — RENAL FUNCTION PANEL
ANION GAP: 15 (ref 5–15)
Albumin: 1.6 g/dL — ABNORMAL LOW (ref 3.5–5.0)
BUN: 40 mg/dL — ABNORMAL HIGH (ref 6–20)
CHLORIDE: 95 mmol/L — AB (ref 101–111)
CO2: 25 mmol/L (ref 22–32)
Calcium: 8.8 mg/dL — ABNORMAL LOW (ref 8.9–10.3)
Creatinine, Ser: 5.7 mg/dL — ABNORMAL HIGH (ref 0.44–1.00)
GFR calc non Af Amer: 8 mL/min — ABNORMAL LOW (ref >60)
GFR, EST AFRICAN AMERICAN: 10 mL/min — AB (ref >60)
Glucose, Bld: 94 mg/dL (ref 65–99)
POTASSIUM: 5 mmol/L (ref 3.5–5.1)
Phosphorus: 6.9 mg/dL — ABNORMAL HIGH (ref 2.5–4.6)
Sodium: 135 mmol/L (ref 135–145)

## 2017-06-18 LAB — GLUCOSE, CAPILLARY
Glucose-Capillary: 122 mg/dL — ABNORMAL HIGH (ref 65–99)
Glucose-Capillary: 111 mg/dL — ABNORMAL HIGH (ref 65–99)
Glucose-Capillary: 161 mg/dL — ABNORMAL HIGH (ref 65–99)

## 2017-06-18 MED ORDER — OXYCODONE HCL 5 MG PO TABS
10.0000 mg | ORAL_TABLET | ORAL | Status: AC | PRN
  Administered 2017-06-18 – 2017-06-19 (×2): 10 mg via ORAL
  Filled 2017-06-18 (×2): qty 2

## 2017-06-18 MED ORDER — HEPARIN SODIUM (PORCINE) 1000 UNIT/ML DIALYSIS: 1000.0000 [IU] | INTRAMUSCULAR | Status: DC | PRN

## 2017-06-18 MED ORDER — LIDOCAINE HCL (PF) 1 % IJ SOLN: 5.0000 mL | INTRAMUSCULAR | Status: DC | PRN

## 2017-06-18 MED ORDER — LORAZEPAM 0.5 MG PO TABS
ORAL_TABLET | ORAL | Status: AC
  Filled 2017-06-18: qty 2

## 2017-06-18 MED ORDER — SODIUM CHLORIDE 0.9 % IV SOLN: 100.0000 mL | INTRAVENOUS | Status: DC | PRN

## 2017-06-18 MED ORDER — PENTAFLUOROPROP-TETRAFLUOROETH EX AERO: 1.0000 "application " | INHALATION_SPRAY | CUTANEOUS | Status: DC | PRN

## 2017-06-18 MED ORDER — HYDROMORPHONE HCL 1 MG/ML IJ SOLN
1.0000 mg | INTRAMUSCULAR | Status: DC | PRN
  Administered 2017-06-18 – 2017-06-23 (×33): 1 mg via INTRAVENOUS
  Filled 2017-06-18 (×30): qty 1

## 2017-06-18 MED ORDER — LIDOCAINE-PRILOCAINE 2.5-2.5 % EX CREA: 1.0000 "application " | TOPICAL_CREAM | CUTANEOUS | Status: DC | PRN

## 2017-06-18 MED ORDER — OXYCODONE HCL 5 MG PO TABS
ORAL_TABLET | ORAL | Status: AC
  Filled 2017-06-18: qty 1

## 2017-06-18 MED ORDER — MORPHINE SULFATE (PF) 4 MG/ML IV SOLN
INTRAVENOUS | Status: AC
  Filled 2017-06-18: qty 1

## 2017-06-18 MED ORDER — MORPHINE SULFATE (PF) 4 MG/ML IV SOLN
4.0000 mg | INTRAVENOUS | Status: DC | PRN
  Administered 2017-06-18: 4 mg via INTRAVENOUS

## 2017-06-18 MED ORDER — VANCOMYCIN HCL IN DEXTROSE 1-5 GM/200ML-% IV SOLN
INTRAVENOUS | Status: AC
  Filled 2017-06-18: qty 200

## 2017-06-18 MED ORDER — ALTEPLASE 2 MG IJ SOLR: 2.0000 mg | Freq: Once | INTRAMUSCULAR | Status: DC | PRN

## 2017-06-18 NOTE — Evaluation (Signed)
Occupational Therapy Evaluation Patient Details Name: [name] MRN: [id number] DOB: Feb 11, 1976 Today's Date: 06/18/2017    History of Present Illness [name] is a 42 y.o. female with history of ESRD on hemodialysis Monday Wednesday Friday recent cardiac arrest in April 16, 2017 and eventually had ventilator dependent respiratory failure and was discharged to long-term acute care and was recently discharged and tracheostomy removed was brought to the ER after patient had a fall at her living facility. Pt found to have an ~38mm hemorrhagic mass in the parietal lobe which pt wishes to f/u for MRI as an outpatient secondary to pain with lying supine. 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 PMH including but not limited to CHF, CAD, DM, ESRD, PVD.   Clinical Impression   PTA Pt was in an out of hospitals/[facility] for past 2 months and then at SNF less than 24 hours prior to this admission. She was performing ADL independently at [facility] level and doing her own functional transfers at wc level. Pt is currently mod A for bed mobility and limited by pain and fatigue from HD earlier today and prior PT session. Pt willing and able to participate with OT for eval - demonstrating motivation to get to CIR level therapies. Pt with decreased balance for LB ADL requiring total A, She requires assist to maintaining sitting EOB which impacts her UB ADL as well. Pt will benefit from skilled OT in the acute setting and afterward at CIR level to return Pt to wc level performing transfers and seated/lateral lean ADL to be able to go home with support from family. Next session to bring +2 assist and work on transfers and seated balance as precursor for ADL.    Follow Up Recommendations  CIR;Supervision/Assistance - 24 hour    Equipment Recommendations  Other (comment)(defer to next venue)    Recommendations for Other Services Rehab  consult     Precautions / Restrictions Precautions Precautions: Fall Precaution Comments: wound vac from groin Restrictions Weight Bearing Restrictions: Yes RLE Weight Bearing: Non weight bearing      Mobility Bed Mobility Overal bed mobility: Needs Assistance Bed Mobility: Supine to Sit;Sit to Supine     Supine to sit: Mod assist(for trunk elevation, increased time, use of bed pad ) Sit to supine: Min assist   General bed mobility comments: increased time and effort, assist to elevate trunk  Transfers Overall transfer level: Needs assistance Equipment used: 2 person hand held assist Transfers: Sit to/from Stand Sit to Stand: Max assist;+2 physical assistance         General transfer comment: declined at this time due to fatigue and pain    Balance Overall balance assessment: Needs assistance Sitting-balance support: Bilateral upper extremity supported Sitting balance-Leahy Scale: Poor Sitting balance - Comments: reliant on UE support, which impacts her independence to perform seated ADL     Standing balance-Leahy Scale: Poor                             ADL either performed or assessed with clinical judgement   ADL Overall ADL's : Needs assistance/impaired Eating/Feeding: Set up;Bed level   Grooming: Wash/dry face;Wash/dry hands;Min guard Grooming Details (indicate cue type and reason): sitting EOB Upper Body Bathing: Moderate assistance   Lower Body Bathing: Maximal assistance   Upper Body Dressing : Moderate assistance   Lower Body Dressing: Maximal assistance  Toilet Transfer: Maximal assistance;+2 for physical assistance;+2 for safety/equipment   Toileting- Clothing Manipulation and Hygiene: Total assistance         General ADL Comments: Pt limited today by fatigue from HD and previous PT session     Vision         Perception     Praxis      Pertinent Vitals/Pain Pain Assessment: Faces Faces Pain Scale: Hurts whole  lot Pain Location: R residual limb Pain Descriptors / Indicators: Sore;Grimacing;Guarding;Discomfort;Moaning Pain Intervention(s): Monitored during session;Repositioned;Patient requesting pain meds-RN notified     Hand Dominance     Extremity/Trunk Assessment Upper Extremity Assessment Upper Extremity Assessment: Generalized weakness   Lower Extremity Assessment Lower Extremity Assessment: RLE deficits/detail RLE Deficits / Details: new amputation RLE: Unable to fully assess due to pain       Communication Communication Communication: No difficulties   Cognition Arousal/Alertness: Awake/alert Behavior During Therapy: Anxious;Flat affect Overall Cognitive Status: Within Functional Limits for tasks assessed                                 General Comments: Pt fatigued from HD earlier today and PT session previously this afternoon   General Comments  Pt very tall! 6 foot 4 inches    Exercises Exercises: Amputee Amputee Exercises Quad Sets: AROM;Strengthening;Right;5 reps;Supine Hip ABduction/ADduction: AROM;Strengthening;Right;5 reps;Supine Knee Flexion: AROM;Strengthening;Right;5 reps;Supine Knee Extension: AROM;Strengthening;Right;5 reps;Supine   Shoulder Instructions      Home Living Family/patient expects to be discharged to:: Private residence Living Arrangements: Children Available Help at Discharge: Family;Available PRN/intermittently Type of Home: House Home Access: Level entry     Home Layout: One level     Bathroom Shower/Tub: Teacher, early years/pre: Standard     Home Equipment: Wheelchair - manual   Additional Comments: pt's daughter reported that pt has been in the hospital/at [facility] for the past two months and was at a SNF for less than 24 hours prior to this admission.      Prior Functioning/Environment Level of Independence: Independent with assistive device(s)        Comments: pt reported that she was using w/c  for mobility and could perform transfers independently. pt also stated that she was independent with ADLs. pt's daughter reported that pt was still ambulating two months ago.        OT Problem List: Decreased strength;Decreased activity tolerance;Impaired balance (sitting and/or standing);Decreased safety awareness;Decreased knowledge of use of DME or AE;Decreased knowledge of precautions;Pain      OT Treatment/Interventions: Self-care/ADL training;Therapeutic exercise;Energy conservation;DME and/or AE instruction;Therapeutic activities;Patient/family education;Balance training    OT Goals(Current goals can be found in the care plan section) Acute Rehab OT Goals Patient Stated Goal: to get stronger OT Goal Formulation: With patient Time For Goal Achievement: 07/02/17 Potential to Achieve Goals: Good ADL Goals Pt Will Perform Grooming: sitting;with supervision Pt Will Perform Upper Body Bathing: with supervision;sitting;with adaptive equipment Pt Will Perform Lower Body Bathing: with supervision;with adaptive equipment;sit to/from stand Pt Will Transfer to Toilet: with min assist;stand pivot transfer;bedside commode Pt Will Perform Toileting - Clothing Manipulation and hygiene: with min guard assist;sitting/lateral leans  OT Frequency: Min 3X/week   Barriers to D/C:            Co-evaluation              AM-PAC PT "6 Clicks" Daily Activity     Outcome Measure Help from another person  eating meals?: A Little Help from another person taking care of personal grooming?: A Little Help from another person toileting, which includes using toliet, bedpan, or urinal?: A Lot Help from another person bathing (including washing, rinsing, drying)?: A Lot Help from another person to put on and taking off regular upper body clothing?: A Lot Help from another person to put on and taking off regular lower body clothing?: Total 6 Click Score: 13   End of Session Nurse Communication: Mobility  status  Activity Tolerance: Patient tolerated treatment well Patient left: in bed;with call bell/phone within reach;with bed alarm set  OT Visit Diagnosis: Unsteadiness on feet (R26.81);Other abnormalities of gait and mobility (R26.89);Muscle weakness (generalized) (M62.81);History of falling (Z91.81);Pain Pain - Right/Left: Right Pain - part of body: Ankle and joints of foot                Time: 2992-4268 OT Time Calculation (min): 14 min Charges:  OT General Charges $OT Visit: 1 Visit OT Evaluation $OT Eval Moderate Complexity: 1 Mod G-Codes:     Hulda Humphrey OTR/L [place] 06/18/2017, 5:58 PM

## 2017-06-18 NOTE — Care Management Note (Signed)
Case Management Note  Patient Details  Name: MADIHA BAMBRICK MRN: [id number] Date of Birth: 12/04/1975  Subjective/Objective:   Admitted with necrotic groin wound, decubitus ulcer.              Action/Plan: In to speak with patient, complaining of pain nurse at bedside. "Lorriane Shire" CSW following for [place].                  Expected Discharge Plan:  Skilled Nursing Facility  In-House Referral:  Clinical Social Work  Discharge planning Services  CM Consult  Status of Service:  In process, will continue to follow  Additional Comments: NCM will continue to follow for discharge transition needs.  Kristen Cardinal, RN  Nurse case manager [phone] 06/18/2017, 2:34 PM

## 2017-06-18 NOTE — Progress Notes (Signed)
[facility] Progress Note   Subjective:   Seen while on HD. 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  Objective Vitals:   06/18/17 0722 06/18/17 0800 06/18/17 0830 06/18/17 0900  BP: (!) 171/83 (!) 158/79 (!) 170/86 (!) (P) 167/88  Pulse: 85 87 84 (P) 85  Resp: 20 19 18  (P) 15  Temp:      TempSrc:      SpO2:      Height:       Physical Exam General:NAD Heart:RRR, 2/6 systolic murmur Lungs:CTAB Abdomen:distended, soft, NT Extremities:LLE: no edema+vascular changes RLE: s/p BKA wrapped Dialysis Access: LU AVF cannulated   Filed Weights    Intake/Output Summary (Last 24 hours) at 06/18/2017 1209 Last data filed at 06/18/2017 0600 Gross per 24 hour  Intake 370 ml  Output 30 ml  Net 340 ml    Additional Objective Labs: Basic Metabolic Panel: Recent Labs  Lab 06/13/17 0915 06/16/17 0639 06/17/17 0241 06/18/17 0612  NA 137 137 135 135  K 4.1 4.5 4.4 5.0  CL 96* 95* 97* 95*  CO2 26 26 22 25   GLUCOSE 213* 231* 258* 94  BUN 48* 40* 28* 40*  CREATININE 5.56* 5.24* 3.98* 5.70*  CALCIUM 8.2* 8.5* 8.3* 8.8*  PHOS 5.3* 6.5*  --  6.9*   Liver Function Tests: Recent Labs  Lab 06/13/17 0915 06/16/17 0639 06/18/17 0612  AST 13*  --   --   ALT 11*  --   --   ALKPHOS 81  --   --   BILITOT 0.8  --   --   PROT 5.2*  --   --   ALBUMIN 1.8* 1.6* 1.6*   No results for input(s): LIPASE, AMYLASE in the last 168 hours. CBC: Recent Labs  Lab 06/12/17 0813 06/13/17 0915 06/16/17 0639 06/17/17 0241 06/18/17 0612  WBC 8.7 8.5 9.9 10.9* 11.7*  HGB 8.9* 7.3* 9.1* 9.2* 8.5*  HCT 28.3* 23.4* 29.6* 30.5* 27.7*  MCV 90.7 90.7 90.0 91.0 89.6  PLT 268 214 271 277 265   Blood Culture    Component Value Date/Time   SDES FLUID 06/12/2017 1319   SDES FLUID PERITONEAL 06/12/2017 1319   SPECREQUEST NONE 06/12/2017 1319   SPECREQUEST BOTTLES DRAWN AEROBIC AND ANAEROBIC 06/12/2017 1319   CULT NO GROWTH 5 DAYS 06/12/2017 1319   REPTSTATUS 06/12/2017 FINAL 06/12/2017  1319   REPTSTATUS 06/17/2017 FINAL 06/12/2017 1319    Cardiac Enzymes: No results for input(s): CKTOTAL, CKMB, CKMBINDEX, TROPONINI in the last 168 hours. CBG: Recent Labs  Lab 06/17/17 1047 06/17/17 1350 06/17/17 1709 06/17/17 1811 06/17/17 2111  GLUCAP 157* 145* 70 99 86   Iron Studies: No results for input(s): IRON, TIBC, TRANSFERRIN, FERRITIN in the last 72 hours. Lab Results  Component Value Date   INR 1.14 06/17/2017   INR 1.20 06/11/2017   INR 1.19 05/09/2017   Studies/Results: No results found.  Medications: . sodium chloride 10 mL/hr at 06/17/17 1146  . sodium chloride    . sodium chloride    . sodium chloride    . magnesium sulfate 1 - 4 g bolus IVPB    . piperacillin-tazobactam (ZOSYN)  IV Stopped (06/18/17 0532)  . vancomycin    . vancomycin 1,000 mg (06/18/17 1032)   . amLODipine  10 mg Oral QHS  . atorvastatin  40 mg Oral q1800  . Chlorhexidine Gluconate Cloth  6 each Topical Daily  . cloNIDine  0.3 mg Oral TID  . darbepoetin (ARANESP) injection -  DIALYSIS  200 mcg Intravenous Q Fri-HD  . docusate  100 mg Oral TID  . feeding supplement (NEPRO CARB STEADY)  237 mL Oral BID BM  . feeding supplement (PRO-STAT SUGAR FREE 64)  30 mL Oral BID  . fentaNYL  25 mcg Transdermal Q72H  . heparin  5,000 Units Subcutaneous Q8H  . insulin aspart  0-9 Units Subcutaneous TID WC  . insulin glargine  14 Units Subcutaneous QHS  . insulin glargine  20 Units Subcutaneous Daily  . isosorbide dinitrate  60 mg Oral BID  . labetalol  300 mg Oral TID  . LORazepam      . metoCLOPramide  2.5 mg Oral TID AC  . multivitamin  1 tablet Oral QHS  . mupirocin ointment  1 application Nasal BID  . pantoprazole  40 mg Oral Daily  . sertraline  50 mg Oral Daily  . sevelamer carbonate  2,400 mg Oral TID WC  . sodium chloride flush  3 mL Intravenous Q12H  . sodium hypochlorite   Irrigation BID    Dialysis Orders: MWF NW 4h 54min 400/800 85.5kg 2/2.25 bath L AVF P2 Hep  2000 - Mircera 258mcg IV q 2wks - supposed to start 06/12/17   Assessment/Plan: 1. Sacral decub/osteo - wound care following. On ABX. per primary  2. 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 Trula Slade. Uncontrolled pain. On ABX. Per [facility]VS 3. Poorly healing R groin wound - wound vac applied, per [facility]/wound care 3. Fall/small IC bleed - needs MRI, pt wishes to do as outpt d/t pain while lying flat. 4. Ascites - s/p large vol paracentesis. Abd distended. Per primary 5. ESRD - HD today, tolerating well. Continue per regular schedule.  6. Anemia of CKD- Hgb 9.2, Aranesp 240mcg qwk started 1/18 7. Secondary hyperparathyroidism - Ca in goal. P elevated. Continue binders.  8. HTN, chronic/ severe - BP elevated, continue home meds.  Volume ^, titrate down as tolerated. BP's always run high and shouldn't be aggressively corrected, does not do well under SBP 140 reportedly 9. Nutrition - Alb 1.6, Renavite, Renal diet with fluid restrictions.  6. CAD    Jen Mow, PA-C [facility] Pager: [phone] 06/18/2017,12:09 PM  LOS: 7 days   Pt seen, examined, agree w assess/plan as above with additions as indicated.  Kelly Splinter MD [employer] pager [phone]    cell [phone] 06/18/2017, 3:29 PM

## 2017-06-18 NOTE — Progress Notes (Signed)
Physical medicine rehabilitation consult requested chart reviewed. Patient with a long complicated medical history right BKA last evening 06/17/2017. At this time will  await physical and occupational therapy evaluations and follow-up with appropriate venue at that time

## 2017-06-18 NOTE — Progress Notes (Signed)
Patient ID: [name], female   DOB: 14-Dec-1975, 42 y.o.   MRN: [id number] The next cranial imaging can be done as an outpatient. It is not necessary now.

## 2017-06-18 NOTE — Evaluation (Addendum)
Physical Therapy Evaluation Patient Details Name: [name] MRN: [id number] DOB: 1976/03/24 Today's Date: 06/18/2017   History of Present Illness  LEVITA MONICAL is a 42 y.o. female with history of ESRD on hemodialysis Monday Wednesday Friday recent cardiac arrest in April 16, 2017 and eventually had ventilator dependent respiratory failure and was discharged to long-term acute care and was recently discharged and tracheostomy removed was brought to the ER after patient had a fall at her living facility. Pt found to have an ~22mm hemorrhagic mass in the parietal lobe which pt wishes to f/u for MRI as an outpatient secondary to pain with lying supine. 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 PMH including but not limited to CHF, CAD, DM, ESRD, PVD.    Clinical Impression  Pt presented supine in bed with HOB elevated, awake and willing to participate in therapy session. Prior to admission, pt reported that she was performing transfers into and out of her w/c and could propel herself in manual w/c independently. Pt reporting that she could perform ADLs independently. Per phone conversation with pt's daughter, pt has been in the hospital and at [facility] for the past two months, and was at [facility] for less than 24 hours prior to this admission. Pt's daughter reported that pt was ambulating short distances two months ago. Pt currently limited by pain; however, once pain is better managed feel that pt would be able to tolerate mobility progression and intensive therapy services. Pt currently requires mod A for bed mobility and two person physical assistance for transfers. Pt has the potential to make enough progress with therapy services at CIR to be able to d/c home at a w/c level with 24/7 supervision/assistance from family. PT will continue to follow pt acutely for mobility progression as tolerated.    Follow Up Recommendations  CIR;Supervision/Assistance - 24 hour    Equipment Recommendations  None recommended by PT    Recommendations for Other Services       Precautions / Restrictions Precautions Precautions: Fall Restrictions Weight Bearing Restrictions: Yes RLE Weight Bearing: Non weight bearing      Mobility  Bed Mobility Overal bed mobility: Needs Assistance Bed Mobility: Supine to Sit;Sit to Supine     Supine to sit: Mod assist Sit to supine: Min guard   General bed mobility comments: increased time and effort, assist to elevate trunk  Transfers Overall transfer level: Needs assistance Equipment used: 2 person hand held assist Transfers: Sit to/from Stand Sit to Stand: Max assist;+2 physical assistance         General transfer comment: pt only able to achieve minimal buttock clearance off of bed, unable to achieve full standing even with max A x2  Ambulation/Gait                Stairs            Wheelchair Mobility    Modified Rankin (Stroke Patients Only)       Balance Overall balance assessment: Needs assistance Sitting-balance support: Bilateral upper extremity supported Sitting balance-Leahy Scale: Poor Sitting balance - Comments: reliant on UE supports     Standing balance-Leahy Scale: Poor                               Pertinent Vitals/Pain Pain Assessment: Faces Faces Pain Scale: Hurts even more Pain Location: R residual limb Pain Descriptors /  Indicators: Sore;Grimacing;Guarding Pain Intervention(s): Monitored during session;Repositioned    Home Living Family/patient expects to be discharged to:: Private residence Living Arrangements: Children Available Help at Discharge: Family;Available PRN/intermittently Type of Home: House Home Access: Level entry     Home Layout: One level Home Equipment: Wheelchair - manual Additional Comments: pt's daughter reported that pt has been in the hospital/at [facility] for the past two months and  was at a SNF for less than 24 hours prior to this admission.    Prior Function Level of Independence: Independent with assistive device(s)         Comments: pt reported that she was using w/c for mobility and could perform transfers independently. pt also stated that she was independent with ADLs. pt's daughter reported that pt was still ambulating two months ago.     Hand Dominance        Extremity/Trunk Assessment   Upper Extremity Assessment Upper Extremity Assessment: Defer to OT evaluation    Lower Extremity Assessment Lower Extremity Assessment: Generalized weakness       Communication   Communication: No difficulties  Cognition Arousal/Alertness: Awake/alert Behavior During Therapy: Anxious;Flat affect Overall Cognitive Status: Within Functional Limits for tasks assessed                                        General Comments      Exercises Amputee Exercises Quad Sets: AROM;Strengthening;Right;5 reps;Supine Hip ABduction/ADduction: AROM;Strengthening;Right;5 reps;Supine Knee Flexion: AROM;Strengthening;Right;5 reps;Supine Knee Extension: AROM;Strengthening;Right;5 reps;Supine   Assessment/Plan    PT Assessment Patient needs continued PT services  PT Problem List Decreased strength;Decreased activity tolerance;Decreased balance;Decreased mobility;Decreased coordination;Decreased safety awareness;Decreased knowledge of use of DME;Decreased knowledge of precautions;Cardiopulmonary status limiting activity;Pain       PT Treatment Interventions DME instruction;Gait training;Functional mobility training;Therapeutic activities;Therapeutic exercise;Balance training;Neuromuscular re-education;Patient/family education    PT Goals (Current goals can be found in the Care Plan section)  Acute Rehab PT Goals Patient Stated Goal: to get stronger PT Goal Formulation: With patient Time For Goal Achievement: 07/02/17 Potential to Achieve Goals: Fair     Frequency Min 3X/week   Barriers to discharge        Co-evaluation               AM-PAC PT "6 Clicks" Daily Activity  Outcome Measure Difficulty turning over in bed (including adjusting bedclothes, sheets and blankets)?: Unable Difficulty moving from lying on back to sitting on the side of the bed? : Unable Difficulty sitting down on and standing up from a chair with arms (e.g., wheelchair, bedside commode, etc,.)?: Unable Help needed moving to and from a bed to chair (including a wheelchair)?: Total Help needed walking in hospital room?: Total Help needed climbing 3-5 steps with a railing? : Total 6 Click Score: 6    End of Session Equipment Utilized During Treatment: Gait belt Activity Tolerance: Patient limited by pain;Patient limited by fatigue Patient left: in bed;with call bell/phone within reach Nurse Communication: Mobility status PT Visit Diagnosis: Other abnormalities of gait and mobility (R26.89);Pain Pain - Right/Left: Right Pain - part of body: Leg    Time: 7169-6789 PT Time Calculation (min) (ACUTE ONLY): 24 min   Charges:   PT Evaluation $PT Eval Moderate Complexity: 1 Mod PT Treatments $Therapeutic Activity: 8-22 mins   PT G Codes:        Hali[place], PT, DPT [place] 06/18/2017,  3:44 PM

## 2017-06-18 NOTE — Progress Notes (Addendum)
  Progress Note    06/18/2017 7:52 AM 1 Day Post-Op  Subjective:  Patient seen on HD unit.  Complains of severe pain to R BKA stump site.   Vitals:   06/17/17 2125 06/18/17 0520  BP: (!) 182/75 (!) 187/83  Pulse: 79 89  Resp: 17 18  Temp: 97.9 F (36.6 C) 98.6 F (37 C)  SpO2: 92% 95%   Physical Exam: Lungs:  Non labored Incisions:  Dressing left in place R BKA stump and R groin Abdomen:  Soft, distended Neurologic: A&O  CBC    Component Value Date/Time   WBC 11.7 (H) 06/18/2017 0612   RBC 3.09 (L) 06/18/2017 0612   HGB 8.5 (L) 06/18/2017 0612   HCT 27.7 (L) 06/18/2017 0612   PLT 265 06/18/2017 0612   MCV 89.6 06/18/2017 0612   MCH 27.5 06/18/2017 0612   MCHC 30.7 06/18/2017 0612   RDW 16.6 (H) 06/18/2017 0612   LYMPHSABS 0.9 05/13/2017 0746   MONOABS 1.0 05/13/2017 0746   EOSABS 0.3 05/13/2017 0746   BASOSABS 0.0 05/13/2017 0746    BMET    Component Value Date/Time   NA 135 06/17/2017 0241   K 4.4 06/17/2017 0241   CL 97 (L) 06/17/2017 0241   CO2 22 06/17/2017 0241   GLUCOSE 258 (H) 06/17/2017 0241   BUN 28 (H) 06/17/2017 0241   CREATININE 3.98 (H) 06/17/2017 0241   CALCIUM 8.3 (L) 06/17/2017 0241   GFRNONAA 13 (L) 06/17/2017 0241   GFRAA 15 (L) 06/17/2017 0241    INR    Component Value Date/Time   INR 1.14 06/17/2017 0241     Intake/Output Summary (Last 24 hours) at 06/18/2017 0752 Last data filed at 06/18/2017 0600 Gross per 24 hour  Intake 370 ml  Output 30 ml  Net 340 ml     Assessment/Plan:  42 y.o. female is s/p R BKA 1 Day Post-Op   R BKA stump dressing change tomorrow Wound vac R groin to be placed after dialysis PT/OT to re-evaluate before return to rehab  DVT prophylaxis:  subq heparin   Dagoberto Ligas, PA-C [facility] [phone] 06/18/2017 7:52 AM  Right BKA contracture will need to work on this and explained to pt it will limit prosthetic options   Change dressing tomorrow  Ruta Hinds,  MD [facility] [place]: [phone] Pager: [phone]

## 2017-06-18 NOTE — NC FL2 (Signed)
[place] LEVEL OF CARE SCREENING TOOL     IDENTIFICATION  Patient Name: [name] Birthdate: Jul 12, 1975 Sex: female Admission Date (Current Location): 06/11/2017  [facility] and [place] Number:  Herbalist and Address:  The . [facility], [place] [address] [place], [place] [address]      Provider Number: [id number]  Attending Physician Name and Address:  Patrecia Pour, MD  Relative Name and Phone Number:  Eliany, Mccarter; [phone];  DeJesus,Jaye - Daughter; [phone]     Current Level of Care: Hospital Recommended Level of Care: Skilled Nursing Facility(From [facility]) Prior Approval Number:    Date Approved/Denied:   PASRR Number: [id number]  (Eff. 06/10/17)  Discharge Plan: SNF    Current Diagnoses: Patient Active Problem List   Diagnosis Date Noted  . Stage IV pressure ulcer of sacral region ([place]) 06/16/2017  . Wound healing, delayed   . Sacral osteomyelitis ([place])   . Chronic renal failure   . Brain mass 06/11/2017  . Fall 06/11/2017  . 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 History of ETT   . Pressure injury of skin 04/22/2017  . Acute respiratory failure with hypoxia ([place])   . Cardiac arrest, cause unspecified ([place])   . Non-healing ulcer ([place])   . Hypertension 03/24/2017  . Hypertensive urgency 03/24/2017  . Cellulitis in diabetic foot ([place]) 03/24/2017  . Cellulitis 03/24/2017  . Chronic pain 03/24/2017  . Diabetic ulcer of lower leg ([place]) 03/24/2017  . Gangrene of toe of right foot ([place])   . Acute on chronic diastolic CHF (congestive heart failure) ([place]) 02/22/2016  . Fluid overload   . Pericardial effusion   . Ascites 02/15/2016  . Uncontrolled diabetes mellitus with end-stage renal disease ([place]) 02/16/2015  . Bimalleolar ankle fracture 02/12/2015  . CAD (coronary artery disease) 02/09/2015  . PAD (peripheral artery disease) ([place])   . Gastroparesis   . Chronic  anemia   . Menorrhagia with regular cycle 01/04/2014  . Dysmenorrhea 01/04/2014  . Nausea & vomiting 10/16/2011  . History of noncompliance with medical treatment 05/09/2011  . Chronic UTI 05/09/2011  . CIN III (cervical intraepithelial neoplasia grade III) with severe dysplasia   . History of abscesses in groins 12/06/2010  . End stage renal disease on dialysis ([place])   . Coronary artery disease   . Hyperlipidemia   . 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 NONDEPENDENT TOBACCO USE DISORDER 12/20/2009  . Essential hypertension, benign 12/20/2009  . NEPHROTIC SYNDROME 12/20/2009    Orientation RESPIRATION BLADDER Height & Weight     Self, Time, Situation, Place  Normal Continent Weight: (UTO, scale broke) Height:  6\' 4"  (193 cm)  BEHAVIORAL SYMPTOMS/MOOD NEUROLOGICAL BOWEL NUTRITION STATUS      Continent Diet(Renal diet with 1200 mL fluid restriction)  AMBULATORY STATUS COMMUNICATION OF NEEDS Skin   Total assist due to amputation on 1/21 Verbally Wound Vac(Wound vac to right groin wound; Stage IV pressure injury to left posterior Ischial tuberosity; Non-pressure wound to left distal 3rd toe tip; wound vac (negative pressure wound therapy) to groin)                       Personal Care Assistance Level of Assistance  Bathing, Feeding, Dressing Bathing Assistance: Limited assistance Feeding assistance: Independent Dressing Assistance: Limited assistance     Functional Limitations Info  Sight, Hearing, Speech Sight Info: Adequate Hearing Info: Adequate Speech Info: Adequate  SPECIAL CARE FACTORS FREQUENCY   PT evaluation 06/18/17  A minimum of 3X per week therapy recommended during acute inpatient stay.                   Contractures Contractures Info: Not present    Additional Factors Info  Code Status, Allergies Code Status Info: Full Allergies Info: Dephalexin, Sulfamethoxazole-trimethoprim           Current Medications (06/18/2017):  This is the current  hospital active medication list Current Facility-Administered Medications  Medication Dose Route Frequency Provider Last Rate Last Dose  . 0.9 %  sodium chloride infusion   Intravenous Continuous Albertha Ghee, MD 10 mL/hr at 06/17/17 1146    . 0.9 %  sodium chloride infusion  250 mL Intravenous PRN Laurence Slate M, PA-C      . 0.9 %  sodium chloride infusion  100 mL Intravenous PRN Penninger, Ria Comment, PA      . 0.9 %  sodium chloride infusion  100 mL Intravenous PRN Penninger, Ria Comment, PA      . acetaminophen (TYLENOL) tablet 650 mg  650 mg Oral Q6H PRN Rise Patience, MD   650 mg at 06/17/17 1805   Or  . acetaminophen (TYLENOL) suppository 650 mg  650 mg Rectal Q6H PRN Rise Patience, MD      . alteplase (CATHFLO ACTIVASE) injection 2 mg  2 mg Intracatheter Once PRN Penninger, Ria Comment, PA      . alum & mag hydroxide-simeth (MAALOX/MYLANTA) 200-200-20 MG/5ML suspension 15-30 mL  15-30 mL Oral Q2H PRN Laurence Slate M, PA-C      . amLODipine (NORVASC) tablet 10 mg  10 mg Oral Nile Riggs, MD   10 mg at 06/17/17 2104  . atorvastatin (LIPITOR) tablet 40 mg  40 mg Oral q1800 Rise Patience, MD   40 mg at 06/17/17 1757  . Chlorhexidine Gluconate Cloth 2 % PADS 6 each  6 each Topical Daily Patrecia Pour, MD   6 each at 06/17/17 [phone]  . cloNIDine (CATAPRES) tablet 0.3 mg  0.3 mg Oral TID Irene Pap N, DO   0.3 mg at 06/17/17 2104  . Darbepoetin Alfa (ARANESP) injection 200 mcg  200 mcg Intravenous Q Solon Palm, MD   200 mcg at 06/13/17 1215  . docusate (COLACE) 50 MG/5ML liquid 100 mg  100 mg Oral TID Rise Patience, MD   100 mg at 06/16/17 2146  . feeding supplement (NEPRO CARB STEADY) liquid 237 mL  237 mL Oral BID BM Loren Racer, PA-C      . feeding supplement (PRO-STAT SUGAR FREE 64) liquid 30 mL  30 mL Oral BID Rise Patience, MD   30 mL at 06/17/17 2106  . fentaNYL (DURAGESIC - dosed mcg/hr) patch 25 mcg  25 mcg Transdermal Q72H  Rise Patience, MD   25 mcg at 06/17/17 2103  . guaiFENesin-dextromethorphan (ROBITUSSIN DM) 100-10 MG/5ML syrup 15 mL  15 mL Oral Q4H PRN Laurence Slate M, PA-C      . heparin injection 1,000 Units  1,000 Units Dialysis PRN Penninger, Ria Comment, [place]      . heparin injection 5,000 Units  5,000 Units Subcutaneous Q8H Laurence Slate M, PA-C   5,000 Units at 06/18/17 5852  . hydrALAZINE (APRESOLINE) injection 10 mg  10 mg Intravenous Q4H PRN Rise Patience, MD   10 mg at 06/17/17 1757  . HYDROmorphone (DILAUDID) injection 1 mg  1 mg Intravenous Q2H PRN Bonner Puna,  Ryan B, MD      . insulin aspart (novoLOG) injection 0-9 Units  0-9 Units Subcutaneous TID WC Rise Patience, MD   3 Units at 06/17/17 0813  . insulin glargine (LANTUS) injection 14 Units  14 Units Subcutaneous QHS Rise Patience, MD   14 Units at 06/16/17 2145  . insulin glargine (LANTUS) injection 20 Units  20 Units Subcutaneous Daily Rise Patience, MD   20 Units at 06/17/17 [phone]  . isosorbide dinitrate (ISORDIL) tablet 60 mg  60 mg Oral BID Rise Patience, MD   60 mg at 06/17/17 2105  . labetalol (NORMODYNE) tablet 300 mg  300 mg Oral TID Rise Patience, MD   300 mg at 06/17/17 2104  . labetalol (NORMODYNE,TRANDATE) injection 10 mg  10 mg Intravenous Q10 min PRN Laurence Slate M, PA-C      . lidocaine (PF) (XYLOCAINE) 1 % injection 5 mL  5 mL Intradermal PRN Penninger, Ria Comment, PA      . lidocaine-prilocaine (EMLA) cream 1 application  1 application Topical PRN Penninger, Ria Comment, [place]      . LORazepam (ATIVAN) 0.5 MG tablet           . LORazepam (ATIVAN) tablet 1 mg  1 mg Oral Q6H PRN Rise Patience, MD   1 mg at 06/18/17 0725  . magnesium sulfate IVPB 2 g 50 mL  2 g Intravenous Daily PRN Laurence Slate M, PA-C      . metoCLOPramide (REGLAN) tablet 2.5 mg  2.5 mg Oral TID AC Hall, Carole N, DO   2.5 mg at 06/17/17 1700  . metoprolol tartrate (LOPRESSOR) injection 2-5 mg  2-5 mg Intravenous Q2H PRN  Laurence Slate M, PA-C      . multivitamin (RENA-VIT) tablet 1 tablet  1 tablet Oral QHS Rise Patience, MD   1 tablet at 06/17/17 2106  . mupirocin ointment (BACTROBAN) 2 % 1 application  1 application Nasal BID Patrecia Pour, MD   1 application at 78/29/56 2106  . ondansetron (ZOFRAN) tablet 4 mg  4 mg Oral Q6H PRN Rise Patience, MD       Or  . ondansetron [facility]) injection 4 mg  4 mg Intravenous Q6H PRN Rise Patience, MD      . ondansetron [facility]) injection 4 mg  4 mg Intravenous Q6H PRN Laurence Slate M, PA-C      . oxyCODONE (Oxy IR/ROXICODONE) immediate release tablet 10 mg  10 mg Oral Q4H PRN Patrecia Pour, MD      . pantoprazole (PROTONIX) EC tablet 40 mg  40 mg Oral Daily Rise Patience, MD   40 mg at 06/17/17 0948  . pentafluoroprop-tetrafluoroeth (GEBAUERS) aerosol 1 application  1 application Topical PRN Penninger, Ria Comment, PA      . phenol (CHLORASEPTIC) mouth spray 1 spray  1 spray Mouth/Throat PRN Laurence Slate M, PA-C      . potassium chloride SA (K-DUR,KLOR-CON) CR tablet 20-40 mEq  20-40 mEq Oral Daily PRN Laurence Slate M, PA-C      . sertraline (ZOLOFT) tablet 50 mg  50 mg Oral Daily Rise Patience, MD   50 mg at 06/17/17 0948  . sevelamer carbonate (RENVELA) tablet 2,400 mg  2,400 mg Oral TID WC Rise Patience, MD   2,400 mg at 06/16/17 1711  . sodium chloride flush (NS) 0.9 % injection 3 mL  3 mL Intravenous Q12H Collins, Emma M, PA-C      .  sodium chloride flush (NS) 0.9 % injection 3 mL  3 mL Intravenous PRN Laurence Slate M, PA-C      . sodium hypochlorite (DAKIN'S 1/4 STRENGTH) topical solution   Irrigation BID Patrecia Pour, MD         Discharge Medications: Please see discharge summary for a list of discharge medications.  Relevant Imaging Results:  Relevant Lab Results:   Additional Information [id number]  Sable Feil, LCSW

## 2017-06-18 NOTE — Anesthesia Postprocedure Evaluation (Signed)
Anesthesia Post Note  Patient: [name]  Procedure(s) Performed: AMPUTATION BELOW KNEE RIGHT (Right Leg Lower)     Patient location during evaluation: PACU Anesthesia Type: Spinal Level of consciousness: oriented and awake and alert Pain management: pain level controlled Vital Signs Assessment: post-procedure vital signs reviewed and stable Respiratory status: spontaneous breathing, respiratory function stable and patient connected to nasal cannula oxygen Cardiovascular status: blood pressure returned to baseline and stable Postop Assessment: no headache, no backache and no apparent nausea or vomiting Anesthetic complications: no    Last Vitals:  Vitals:   06/17/17 2125 06/18/17 0520  BP: (!) 182/75 (!) 187/83  Pulse: 79 89  Resp: 17 18  Temp: 36.6 C 37 C  SpO2: 92% 95%    Last Pain:  Vitals:   06/18/17 0615  TempSrc:   PainSc: Asleep                 Telesha Deguzman S

## 2017-06-18 NOTE — Progress Notes (Signed)
PROGRESS NOTE    [name]  VHQ:[id number] DOB: 1975-08-02 DOA: 06/11/2017 PCP: Merrilee Seashore, MD   Brief Narrative:  [name] is a 42 y.o. female with a history of ESRD, CAD, severe PVD s/p right CFA, profunda, SFA and external iliac endarterectomy with bovine patch angioplasty on 41/32/44 complicated by perioperative cardiac arrests x2, VDRF, and poor wound healing at the right groin site. Wound vac was placed 12/14 and right TMA was performed. She was subsequently discharged to [facility] and subsequently SNF 06/10/2017 where she continued to have poor wound healing and presented to the ED 1/16 after a fall from ground level with head trauma. CT head demonstrated an 8 mm hyperdense lesion concerning for hemorrhagic mass and parietal area. Initially with headache, this has resolved and she has no focal deficits. MRI was ordered as recommended by neurosurgery, though was unable to be performed due to poor patient tolerance. Volume overload was suspected at admission with large volume ascites present, undergoing 9.8L paracentesis 1/17. CT of the pelvis demonstrated osteomyelitis, IV antibiotics started and ID ultimately consulted. 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 Vanc/zosyn currently.  - Offload as able - WOC recommendations  Poorly healing right groin wound: At site of vascular surgery.  - Per VVS. 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  - Has uncontrolled post operative pain. Received dilaudid 0.5mg  x4 with some relief post op, and since has had uncontrolled pain despite morphine 4mg  IV and 5mg  oxycodone. Due to renal failure, will avoid morphine.  - Continue fentanyl patch 45mcg q72h (last placed 1/22) - Start oxycodone 10mg  prn moderate pain, dilaudid 1mg  IV q2h prn severe pain. May benefit from dilaudid bolus-only PCA if pain control remains an issue.  - PT/OT evaluations ordered, may benefit from CIR.   ESRD on hemodialysis:  - Continue MWF HD per nephrology  ICH s/p fall with head trauma: ~8 mm in size in the parietal lobe. No symptoms currently.  - Neurosurgery recommended MRI which the patient wishes to pursue as an outpatient due to pain laying flat.   Chronic systolic CHF: EF 01-02%. Euvolemic.  - Continue HD for fluid removal, also received paracentesis 1/17 for ascites. SAAG 0.4, possibly due to renal failure, cytology negative. Will repeat for therapeutic purposes and repeat cytology.  - Fluid restriction.   Anemia of chronic disease: Likely from her ESRD. Hgb stable without any obvious signs of blood loss.  - Slight hgb drop, will continue to monitor post op, transfuse if <7 g/dl.   HTN - All of her home meds are resumed including labetalol, clonidine, Norvasc, hydralazine, imdur. - Continue HD per nephro. - IV hydralazine as needed.   DVT prophylaxis: SCDs Code Status: Full code Family Communication: Daughter at bedside this AM Disposition Plan: Uncertain, pending postoperative course. Therapy evaluations ordered.   Consultants:   Vascular surgery  Infectious disease  Nephrology  Antimicrobials:   Vancomycin 1/17  Zosyn 1/17  Subjective: Pain not controlled in right BKA site despite morphine and oxycodone this AM. Couldn't sleep due to pain last night. Seen in HD this AM.    Objective: Vitals:   06/18/17 0722 06/18/17 0800 06/18/17 0830 06/18/17 0900  BP: (!) 171/83 (!)  158/79 (!) 170/86 (!) (P) 167/88  Pulse: 85 87 84 (P) 85  Resp: 20 19 18  (P) 15  Temp:      TempSrc:      SpO2:      Height:        Intake/Output Summary (Last 24 hours) at 06/18/2017 1159 Last data filed at 06/18/2017 0600 Gross per 24 hour  Intake 370 ml  Output 30 ml  Net 340 ml   Filed Weights    Examination: Gen: Chronically ill-appearing female in evident pain HEENT: MMM, posterior oropharynx clear Pulm: Non-labored and clear with decreased breath sounds at bases CV: Regular rate, systolic hum; distal pulses intact/symmetric GI: + BS; soft, distended but not tender Skin: As below Neuro: A&Ox3, CN II-XII without deficits Extremities: Right BKA with ACE wrap. Right groin wound unchanged. LUE AVF +bruit/thrill.   CBC: Recent Labs  Lab 06/12/17 0813 06/13/17 0915 06/16/17 0639 06/17/17 0241 06/18/17 0612  WBC 8.7 8.5 9.9 10.9* 11.7*  HGB 8.9* 7.3* 9.1* 9.2* 8.5*  HCT 28.3* 23.4* 29.6* 30.5* 27.7*  MCV 90.7 90.7 90.0 91.0 89.6  PLT 268 214 271 277 191   Basic Metabolic Panel: Recent Labs  Lab 06/12/17 0813 06/13/17 0915 06/16/17 0639 06/17/17 0241 06/18/17 0612  NA 139 137 137 135 135  K 4.1 4.1 4.5 4.4 5.0  CL 97* 96* 95* 97* 95*  CO2 28 26 26 22 25   GLUCOSE 125* 213* 231* 258* 94  BUN 37* 48* 40* 28* 40*  CREATININE 4.58* 5.56* 5.24* 3.98* 5.70*  CALCIUM 8.7* 8.2* 8.5* 8.3* 8.8*  MG 1.7  --   --   --   --   PHOS  --  5.3* 6.5*  --  6.9*   GFR: Estimated Creatinine Clearance: 16.9 mL/min (A) (by C-G formula based on SCr of 5.7 mg/dL (H)).   Liver Function Tests: Recent Labs  Lab 06/13/17 0915 06/16/17 0639 06/18/17 0612  AST 13*  --   --   ALT 11*  --   --   ALKPHOS 81  --   --   BILITOT 0.8  --   --   PROT 5.2*  --   --   ALBUMIN 1.8* 1.6* 1.6*   Coagulation Profile: Recent Labs  Lab 06/11/17 1922 06/17/17 0241  INR 1.20 1.14     LOS: 7 days    Time spent: 25 mins  Vance Gather, MD [facility] Pager [phone]   If  7PM-7AM, please contact night-coverage [email or web address] Password [facility] 06/18/2017, 11:59 AM

## 2017-06-18 NOTE — Consult Note (Addendum)
[place] Nurse wound consult note Pt is followed by the vascular team for assessment and plan of care. Reason for Consult: Consult requested to apply vac dresing to right groin wound.   Wound type: Full thickness post-op wound to groin Measurement:7X2X.3cm Wound bed: 85% red, 15% yellow Drainage (amount, consistency, odor) minimal amt pink drainage Periwound: intact skin surrounding Dressing procedure/placement/frequency: Applied barrier ring to attempt to maintain a seal, then one piece black foam to 147mm cont suction.  Pt tolerated without c/o discomfort.  Plan Vac dressing changes Q M/W/F             Julien Girt MSN, RN, [place], [place], [place]

## 2017-06-18 NOTE — Progress Notes (Signed)
Patient is from [place] skilled nursing facility with plans to return back to skilled nursing facility. Hold on formal rehabilitation consult with discharge plan established for SNF

## 2017-06-18 NOTE — Progress Notes (Signed)
[facility] for Infectious Disease   Reason for visit: Follow up on necrotic groin wound, decubitus ulcer  Interval History: s/p BKA yesterday; VAC placement today in groin; no fever, WBC 11.7, no complaints to me  Physical Exam: Constitutional:  Vitals:   06/18/17 0900 06/18/17 1132  BP: (!) 167/88 (!) 157/81  Pulse: 85 84  Resp: 15 18  Temp:  98.9 F (37.2 C)  SpO2:  96%   patient appears in NAD Eyes: anicteric Respiratory: Normal respiratory effort; CTA B Cardiovascular: RRR GI: soft, nt, nd  Review of Systems: Constitutional: negative for fevers and chills Respiratory: negative for cough Gastrointestinal: negative for diarrhea  Lab Results  Component Value Date   WBC 11.7 (H) 06/18/2017   HGB 8.5 (L) 06/18/2017   HCT 27.7 (L) 06/18/2017   MCV 89.6 06/18/2017   PLT 265 06/18/2017    Lab Results  Component Value Date   CREATININE 5.70 (H) 06/18/2017   BUN 40 (H) 06/18/2017   NA 135 06/18/2017   K 5.0 06/18/2017   CL 95 (L) 06/18/2017   CO2 25 06/18/2017    Lab Results  Component Value Date   ALT 11 (L) 06/13/2017   AST 13 (L) 06/13/2017   ALKPHOS 81 06/13/2017     Microbiology: Recent Results (from the past 240 hour(s))  Culture, blood (routine x 2)     Status: None   Collection Time: 06/12/17  9:30 AM  Result Value Ref Range Status   Specimen Description BLOOD RIGHT ANTECUBITAL  Final   Special Requests   Final    BOTTLES DRAWN AEROBIC AND ANAEROBIC Blood Culture adequate volume   Culture NO GROWTH 5 DAYS  Final   Report Status 06/17/2017 FINAL  Final  Culture, blood (routine x 2)     Status: None   Collection Time: 06/12/17  9:45 AM  Result Value Ref Range Status   Specimen Description BLOOD RIGHT FOREARM  Final   Special Requests   Final    BOTTLES DRAWN AEROBIC AND ANAEROBIC Blood Culture adequate volume   Culture NO GROWTH 5 DAYS  Final   Report Status 06/17/2017 FINAL  Final  MRSA PCR Screening     Status: None   Collection Time:  06/12/17 10:45 AM  Result Value Ref Range Status   MRSA by PCR NEGATIVE NEGATIVE Final    Comment:        The GeneXpert MRSA Assay (FDA approved for NASAL specimens only), is one component of a comprehensive MRSA colonization surveillance program. It is not intended to diagnose MRSA infection nor to guide or monitor treatment for MRSA infections.   Gram stain     Status: None   Collection Time: 06/12/17  1:19 PM  Result Value Ref Range Status   Specimen Description FLUID  Final   Special Requests NONE  Final   Gram Stain NO WBC SEEN NO ORGANISMS SEEN   Final   Report Status 06/12/2017 FINAL  Final  Culture, body fluid-bottle     Status: None   Collection Time: 06/12/17  1:19 PM  Result Value Ref Range Status   Specimen Description FLUID PERITONEAL  Final   Special Requests BOTTLES DRAWN AEROBIC AND ANAEROBIC  Final   Culture NO GROWTH 5 DAYS  Final   Report Status 06/17/2017 FINAL  Final  Surgical pcr screen     Status: Abnormal   Collection Time: 06/16/17  7:33 PM  Result Value Ref Range Status   MRSA, PCR NEGATIVE NEGATIVE  Final   Staphylococcus aureus POSITIVE (A) NEGATIVE Final    Comment: (NOTE) The Xpert SA Assay (FDA approved for NASAL specimens in patients 51 years of age and older), is one component of a comprehensive surveillance program. It is not intended to diagnose infection nor to guide or monitor treatment.     Impression/Plan:  1. Groin wound - now VAC placement.  Has been on antibiotics since 1/17.  Stable.  WOC noted of wound and no pus, no necrosis noted.  Will stop antibiotics at this time.   2.  Necrotic non healing right transmetatarsal amputation - now s/p BKA.  No further indication for antibiotics.  3.  Decubitus ulcer - CT noted some changes of bone and osteomyelitis possible.  I recommend continued aggressive wound care for this and if there is no improvement with wound care over the next several weeks, we can consider treatment for  osteomyelitis if then indicated.  No indication at this time.    I will sign off, call with questions.  Thanks

## 2017-06-19 DIAGNOSIS — E8770 Fluid overload, unspecified: Secondary | ICD-10-CM

## 2017-06-19 DIAGNOSIS — Z89512 Acquired absence of left leg below knee: Secondary | ICD-10-CM

## 2017-06-19 LAB — BASIC METABOLIC PANEL
ANION GAP: 14 (ref 5–15)
BUN: 26 mg/dL — ABNORMAL HIGH (ref 6–20)
CALCIUM: 8.6 mg/dL — AB (ref 8.9–10.3)
CO2: 26 mmol/L (ref 22–32)
CREATININE: 4.07 mg/dL — AB (ref 0.44–1.00)
Chloride: 93 mmol/L — ABNORMAL LOW (ref 101–111)
GFR calc non Af Amer: 13 mL/min — ABNORMAL LOW (ref >60)
GFR, EST AFRICAN AMERICAN: 15 mL/min — AB (ref >60)
Glucose, Bld: 176 mg/dL — ABNORMAL HIGH (ref 65–99)
Potassium: 4.5 mmol/L (ref 3.5–5.1)
SODIUM: 133 mmol/L — AB (ref 135–145)

## 2017-06-19 LAB — GLUCOSE, CAPILLARY
GLUCOSE-CAPILLARY: 170 mg/dL — AB (ref 65–99)
GLUCOSE-CAPILLARY: 149 mg/dL — AB (ref 65–99)
GLUCOSE-CAPILLARY: 171 mg/dL — AB (ref 65–99)
Glucose-Capillary: 144 mg/dL — ABNORMAL HIGH (ref 65–99)

## 2017-06-19 LAB — CBC
HEMATOCRIT: 27.6 % — AB (ref 36.0–46.0)
Hemoglobin: 8.5 g/dL — ABNORMAL LOW (ref 12.0–15.0)
MCH: 27.9 pg (ref 26.0–34.0)
MCHC: 30.8 g/dL (ref 30.0–36.0)
MCV: 90.5 fL (ref 78.0–100.0)
PLATELETS: 267 10*3/uL (ref 150–400)
RBC: 3.05 MIL/uL — ABNORMAL LOW (ref 3.87–5.11)
RDW: 17 % — AB (ref 11.5–15.5)
WBC: 11.4 10*3/uL — AB (ref 4.0–10.5)

## 2017-06-19 MED ORDER — OXYCODONE HCL 15 MG PO TABS: 15.0000 mg | ORAL_TABLET | Freq: Four times a day (QID) | ORAL | 0 refills | Status: DC | PRN

## 2017-06-19 MED ORDER — FENTANYL 25 MCG/HR TD PT72: 25.0000 ug | MEDICATED_PATCH | TRANSDERMAL | 0 refills | Status: DC

## 2017-06-19 NOTE — Progress Notes (Signed)
Orthopedic Tech Progress Note Patient Details:  [name] 14-Mar-1976 [id number]  Patient ID: [name], female   DOB: Jun 13, 1975, 42 y.o.   MRN: [id number]   Hildred Priest 06/19/2017, 4:39 PM Called in bio-tech brace order; spoke with Bella Kennedy

## 2017-06-19 NOTE — Progress Notes (Addendum)
[facility] [place].   Objective (!) 160/62 77 98.2 F (36.8 C) (Oral) 18 92%  Intake/Output Summary (Last 24 hours) at 06/19/2017 0827 Last data filed at 06/19/2017 0600 Gross per 24 hour  Intake 0 ml  Output 4000 ml  Net -4000 ml    Right BKA stump without drainage and no erythema. Right groin with intact wound vac to continuous suction  Assessment/Planning: POD # 2 Right BKA  Stump appears viable Continue wound vac right groin  [name] 06/19/2017 8:27 AM -- Agree with above.  Pt has 10 degree contracture of right BKA.  Have discussed exercises to improve this. Continue VAC on groin Dry dressing BKA  Ruta Hinds, MD [facility] [place] Office: [phone] Pager: [phone]  Laboratory Lab Results: Recent Labs    06/17/17 0241 06/18/17 0612  WBC 10.9* 11.7*  HGB 9.2* 8.5*  HCT 30.5* 27.7*  PLT 277 265   BMET Recent Labs    06/17/17 0241 06/18/17 0612  NA 135 135  K 4.4 5.0  CL 97* 95*  CO2 22 25  GLUCOSE 258* 94  BUN 28* 40*  CREATININE 3.98* 5.70*  CALCIUM 8.3* 8.8*    COAG Lab Results  Component Value Date   INR 1.14 06/17/2017   INR 1.20 06/11/2017   INR 1.19 05/09/2017   No results found for: PTT

## 2017-06-19 NOTE — Consult Note (Signed)
Physical Medicine and Rehabilitation Consult   Reason for Consult: Functional deficits due to R-BKA Referring Physician: Dr. Bonner Puna   HPI: [name] is a 42 y.o. female with histoyr of CAD, ESRD, T2DM, right foot wounds s/p revascularization complicated hospital course 03/2017, sacral decub and RLE ischemia requiring right metatarsal amputation with exploration of right groin wound 04/2017.  She was discharged to SNF 1/16 and readmitted later that evening due to fall out of bed.  CT head revealed hyperdense focus in left posterior parietal region suspicious for small hemorrhagic mass. Dr. Christella Noa recommended follow up MRI but she was unable to lie flat for MRI and can be done on outpatient basis per NS.  She was found to have ascites due to fluid overload and underwent paracentesis of 9.8 L fluid. CT abdomen/pelvis done due to stage IV sacral decub and wound drainage and was positive for sacral osteomyelitis. She was started on  IV Vanc and Zosyn--ID following for input.  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 Trula Slade. Therapy evaluations done and CIR recommended due to functional deficits.   Independent prior to 03/2017. Reports that she was able to perform stand pivot transfers with assist at [facility] but had limited therapy. Only got out of bed every other day and currently severely deconditioned.    Review of Systems  Constitutional: Negative for chills and fever.  HENT: Negative for hearing loss.   Eyes: Negative for blurred vision and double vision.  Respiratory: Negative for cough and shortness of breath.   Cardiovascular: Negative for chest pain, palpitations and leg swelling.  Gastrointestinal: Negative for heartburn and nausea.  Skin: Negative for itching and rash.  Neurological: Positive for sensory change, focal weakness and weakness.  Psychiatric/Behavioral: Negative for depression and memory loss.      Past  Medical History:  Diagnosis Date  . Arthritis   . CHF (congestive heart failure) ([place])   . Chronic anemia    2nd to renal disease  . CIN III (cervical intraepithelial neoplasia grade III) with severe dysplasia    S/P LEEP AND CONE  . Coronary artery disease    Status post cardiac catheterization June 2012 scattered coronary artery disease/atherosclerosis with 70-80% stenosis in a small right PDA.  . Diabetic Charcot's joint disease ([place])   . DM (diabetes mellitus) ([place])    Long-term insulin  . End stage renal disease on dialysis [facility]) 05/02/11   "[facility]"; [facility]; M; W, F ("got it 04/15/2017 because of Thanksgiving")  . Fracture of 5th metatarsal 2016   Right  . Gastroparesis   . Heart murmur   . 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 History of abscesses in groins 12/06/2010  . Hyperlipidemia    Hypertriglyceridemia 449 HDL 25  . Hypertension   . Migraines    "just on dialysis days"  . Peripheral neuropathy    related to DM  . Peripheral vascular disease ([place])    Tibial occlusive disease evaluated by Dr. Kellie Simmering in August 2011. Medical therapy  . Renal insufficiency    Dialysis since 2012  . Seizures ([place])    "only when her sugar drops" (04/17/2017)  . Tobacco use disorder    Discontinued March 2012  . Trimalleolar fracture of ankle, closed 02/09/2015   Right    Past Surgical History:  Procedure Laterality Date  . ABDOMINAL AORTOGRAM W/LOWER EXTREMITY [name] 03/27/2017   Procedure: ABDOMINAL AORTOGRAM W/LOWER EXTREMITY;  Surgeon: Conrad Morrow, MD;  Location: [place] [facility];  Service: Cardiovascular;  Laterality: [name];  . AMPUTATION Right 05/09/2017   Procedure: AMPUTATION TRANSMETATARSAL OF RIGHT FOOT;  Surgeon: Serafina Mitchell, MD;  Location: [place];  Service: Vascular;  Laterality: Right;  . AMPUTATION Right 06/17/2017   Procedure: AMPUTATION BELOW KNEE RIGHT;  Surgeon: Serafina Mitchell, MD;  [facility];  Service: Vascular;  Laterality: Right;  . AV FISTULA PLACEMENT Left  04/2010   "lower arm"  . CARDIAC CATHETERIZATION [name] 02/10/2015   Procedure: Left Heart Cath and Coronary Angiography;  Surgeon: Dixie Dials, MD;  Location: [place] [facility];  Service: Cardiovascular;  Laterality: [name];  . CARDIAC CATHETERIZATION [name] 02/21/2016   Procedure: Right Heart Cath;  Surgeon: Jolaine Artist, MD;  Location: [place] [facility];  Service: Cardiovascular;  Laterality: [name];  . CATARACT EXTRACTION W/ INTRAOCULAR LENS  IMPLANT, BILATERAL Bilateral   . CERVICAL BIOPSY  W/ LOOP ELECTRODE EXCISION     h/o  . CERVICAL CONE BIOPSY     h/o  . COLPOSCOPY    . DILATION AND CURETTAGE OF UTERUS  2009  . ENDARTERECTOMY FEMORAL Right 04/16/2017   Procedure: RIGHT FEMORAL ENDARTERECTOMY;  Surgeon: Serafina Mitchell, MD;  Location: [place];  Service: Vascular;  Laterality: Right;  . FRACTURE SURGERY    . HERNIA REPAIR    . INCISION AND DRAINAGE ABSCESS     "groin"  . INSERTION OF DIALYSIS CATHETER Left 04/16/2017   Procedure: INSERTION of temporay DIALYSIS CATHETER, left femoral artery;  Surgeon: Serafina Mitchell, MD;  Location: [place];  Service: Vascular;  Laterality: Left;  . IR HYBRID TRAUMA EMBOLIZATION  04/16/2017  . IR PARACENTESIS  06/12/2017  . ORIF ANKLE FRACTURE Right 02/16/2015   Procedure: OPEN REDUCTION INTERNAL FIXATION (ORIF) RIGHT ANKLE FRACTURE;  Surgeon: Altamese [place], MD;  Location: [place];  Service: Orthopedics;  Laterality: Right;  . PATCH ANGIOPLASTY Right 04/16/2017   Procedure: PATCH ANGIOPLASTY OF RIGHT FEMORAL ARTERY USING BOVINE PERICARDIUM PATCH;  Surgeon: Serafina Mitchell, MD;  [facility];  Service: Vascular;  Laterality: Right;  . UMBILICAL HERNIA REPAIR  X 2  . WOUND EXPLORATION Right 05/09/2017   Procedure: EXPLORATION RIGHT GROIN;  Surgeon: Serafina Mitchell, MD;  [facility];  Service: Vascular;  Laterality: Right;    Family History  Problem Relation Age of Onset  . Diabetes Mother   . Hypertension Mother   . Diabetes Father   .  Hyperlipidemia Father   . Hypertension Father   . Diabetes Sister   . Stroke Maternal Grandmother   . Diabetes Sister   . Breast cancer Maternal Aunt        Age 36's    Social History:  Lived with husband and adult daughter prior to November. She was independent without AD. Husband works days. She reports that she quit smoking about 5 years ago. Her smoking use included cigarettes. She has a 3.00 pack-year smoking history. she has never used smokeless tobacco. She reports that she does not drink alcohol or use drugs.    Allergies  Allergen Reactions  . Cephalexin Other (See Comments)    Reaction unknown- Childhood allergy Tolerated Ceftriaxone in the past  . Sulfamethoxazole-Trimethoprim Other (See Comments)    Unknown reaction. Pt states that she was told by her mother that she had allergy to Bactrim as a child.   Medications Prior to Admission  Medication Sig Dispense Refill  . Amino  Acids-Protein Hydrolys (FEEDING SUPPLEMENT, PRO-STAT SUGAR FREE 64,) LIQD Take 30 mLs by mouth 2 (two) times daily.    Marland Kitchen amLODipine (NORVASC) 10 MG tablet Take 10 mg by mouth 2 (two) times daily.     Marland Kitchen aspirin EC 81 MG EC tablet Take 1 tablet (81 mg total) by mouth daily. 30 tablet 0  . atorvastatin (LIPITOR) 40 MG tablet Take 40 mg by mouth daily at 6 PM.     . b complex-vitamin c-folic acid (NEPHRO-VITE) 0.8 MG TABS tablet Take 1 tablet by mouth daily.    . cloNIDine (CATAPRES) 0.3 MG tablet Take 0.3 mg by mouth 3 (three) times daily.    Marland Kitchen docusate (COLACE) 50 MG/5ML liquid Take 100 mg by mouth 3 (three) times daily.    . hydrALAZINE (APRESOLINE) 100 MG tablet Take 1 tablet (100 mg total) by mouth every 8 (eight) hours. 90 tablet 1  . Insulin Glargine (LANTUS SOLOSTAR) 100 UNIT/ML Solostar Pen Inject 14 Units into the skin daily at 10 pm.     . Insulin Glargine (LANTUS SOLOSTAR) 100 UNIT/ML Solostar Pen Inject 20 Units into the skin every morning.    . isosorbide dinitrate (ISORDIL) 30 MG tablet Take  60 mg by mouth 2 (two) times daily. Give two 30 mg tablets to = 60 mg BID    . labetalol (NORMODYNE) 300 MG tablet Take 1 tablet (300 mg total) by mouth 3 (three) times daily. 270 tablet 3  . LORazepam (ATIVAN) 1 MG tablet Take 1 mg by mouth every 6 (six) hours as needed for anxiety.    . metoCLOPramide (REGLAN) 10 MG tablet Take 10 mg by mouth 3 (three) times daily.    . ondansetron (ZOFRAN) 4 MG tablet Take 4 mg by mouth every 6 (six) hours as needed for nausea or vomiting.    . pantoprazole (PROTONIX) 40 MG tablet Take 40 mg by mouth daily.    . sertraline (ZOLOFT) 50 MG tablet Take 50 mg by mouth daily.    . sevelamer carbonate (RENVELA) 800 MG tablet Take 2,400 mg by mouth 3 (three) times daily with meals. Give 3 tablets to = 2400 mg TID    . spironolactone (ALDACTONE) 100 MG tablet Take 100 mg daily by mouth.  11  . Darbepoetin Alfa (ARANESP, ALBUMIN FREE,) 200 MCG/ML SOLN Inject 200 mg as directed. Give at dialysis on Wednesday    . multivitamin (RENA-VIT) TABS tablet Take 1 tablet by mouth at bedtime. 30 tablet 0    Home: Home Living Family/patient expects to be discharged to:: Private residence Living Arrangements: Children Available Help at Discharge: Family, Available PRN/intermittently Type of Home: House Home Access: Level entry Home Layout: One level Bathroom Shower/Tub: Chiropodist: Standard Home Equipment: Wheelchair - manual Additional Comments: pt's daughter reported that pt has been in the hospital/at [facility] for the past two months and was at a SNF for less than 24 hours prior to this admission.  Functional History: Prior Function Level of Independence: Independent with assistive device(s) Comments: pt reported that she was using w/c for mobility and could perform transfers independently. pt also stated that she was independent with ADLs. pt's daughter reported that pt was still ambulating two months ago. Functional Status:  Mobility: Bed  Mobility Overal bed mobility: Needs Assistance Bed Mobility: Supine to Sit, Sit to Supine Supine to sit: Mod assist(for trunk elevation, increased time, use of bed pad ) Sit to supine: Min assist General bed mobility comments: increased time and effort,  assist to elevate trunk Transfers Overall transfer level: Needs assistance Equipment used: 2 person hand held assist Transfers: Sit to/from Stand Sit to Stand: Max assist, +2 physical assistance General transfer comment: declined at this time due to fatigue and pain      ADL: ADL Overall ADL's : Needs assistance/impaired Eating/Feeding: Set up, Bed level Grooming: Wash/dry face, Wash/dry hands, Min guard Grooming Details (indicate cue type and reason): sitting EOB Upper Body Bathing: Moderate assistance Lower Body Bathing: Maximal assistance Upper Body Dressing : Moderate assistance Lower Body Dressing: Maximal assistance Toilet Transfer: Maximal assistance, +2 for physical assistance, +2 for safety/equipment Toileting- Clothing Manipulation and Hygiene: Total assistance General ADL Comments: Pt limited today by fatigue from HD and previous PT session  Cognition: Cognition Overall Cognitive Status: Within Functional Limits for tasks assessed Orientation Level: Oriented X4 Cognition Arousal/Alertness: Awake/alert Behavior During Therapy: Anxious, Flat affect Overall Cognitive Status: Within Functional Limits for tasks assessed General Comments: Pt fatigued from HD earlier today and PT session previously this afternoon  Blood pressure (!) 178/71, pulse 75, temperature 98.3 F (36.8 C), temperature source Oral, resp. rate 18, height 6\' 4"  (1.93 m), weight 76.6 kg (168 lb 14.4 oz), last menstrual period 10/09/2015, SpO2 96 %. Physical Exam  Nursing note and vitals reviewed. Constitutional: She is oriented to person, place, and time. She appears well-developed and well-nourished. No distress.  HENT:  Head: Normocephalic and  atraumatic.  Mouth/Throat: Oropharynx is clear and moist.  Eyes: Conjunctivae and EOM are normal. Pupils are equal, round, and reactive to light.  Neck: Normal range of motion. Neck supple.  Cardiovascular: Normal rate and regular rhythm. Exam reveals no friction rub.  No murmur heard. Respiratory: Effort normal and breath sounds normal. No stridor. No respiratory distress. She has no wheezes.  GI: Soft. Bowel sounds are normal. She exhibits distension. There is no tenderness.  Abdomen distended.  Musculoskeletal: She exhibits no edema.  LLE-- insensate to mid shin, muscle wasting noted and with foot drop. R-BKA with dry sock, knee flexed at 20 degrees   Neurological: She is alert and oriented to person, place, and time.  Sedated. Does arouse. Slow to initiate and can answer some biographical questions.   Skin: Skin is warm and dry. No rash noted. She is not diaphoretic. No erythema.    Results for orders placed or performed during the hospital encounter of 06/11/17 (from the past 24 hour(s))  Glucose, capillary     Status: Abnormal   Collection Time: 06/18/17  9:11 PM  Result Value Ref Range   Glucose-Capillary 161 (H) 65 - 99 mg/dL  CBC     Status: Abnormal   Collection Time: 06/19/17  7:07 AM  Result Value Ref Range   WBC 11.4 (H) 4.0 - 10.5 K/uL   RBC 3.05 (L) 3.87 - 5.11 MIL/uL   Hemoglobin 8.5 (L) 12.0 - 15.0 g/dL   HCT 27.6 (L) 36.0 - 46.0 %   MCV 90.5 78.0 - 100.0 fL   MCH 27.9 26.0 - 34.0 pg   MCHC 30.8 30.0 - 36.0 g/dL   RDW 17.0 (H) 11.5 - 15.5 %   Platelets 267 150 - 400 K/uL  Basic metabolic panel     Status: Abnormal   Collection Time: 06/19/17  7:07 AM  Result Value Ref Range   Sodium 133 (L) 135 - 145 mmol/L   Potassium 4.5 3.5 - 5.1 mmol/L   Chloride 93 (L) 101 - 111 mmol/L   CO2 26 22 - 32 mmol/L  Glucose, Bld 176 (H) 65 - 99 mg/dL   BUN 26 (H) 6 - 20 mg/dL   Creatinine, Ser 4.07 (H) 0.44 - 1.00 mg/dL   Calcium 8.6 (L) 8.9 - 10.3 mg/dL   GFR calc non Af  Amer 13 (L) >60 mL/min   GFR calc Af Amer 15 (L) >60 mL/min   Anion gap 14 5 - 15  Glucose, capillary     Status: Abnormal   Collection Time: 06/19/17  7:40 AM  Result Value Ref Range   Glucose-Capillary 170 (H) 65 - 99 mg/dL  Glucose, capillary     Status: Abnormal   Collection Time: 06/19/17 11:46 AM  Result Value Ref Range   Glucose-Capillary 149 (H) 65 - 99 mg/dL  Glucose, capillary     Status: Abnormal   Collection Time: 06/19/17  4:44 PM  Result Value Ref Range   Glucose-Capillary 171 (H) 65 - 99 mg/dL   No results found.  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 Does the need for close, 24 hr/day medical supervision in concert with the patient's rehab needs make it unreasonable for this patient to be served in a less intensive setting? Potentially 2. Co-Morbidities requiring supervision/potential complications: ESRD, decubitus ulcer, CAD, ascites 3. Due to bowel management, safety, skin/wound care, disease management, medication administration, pain management and patient education, does the patient require 24 hr/day rehab nursing? Potentially 4. Does the patient require coordinated care of a physician, rehab nurse, PT (1-2 hrs/day, 5 days/week), OT (1-2 hrs/day, 5 days/week) and SLP (1-2 hrs/day, 5 days/week) to address physical and functional deficits in the context of the above medical diagnosis(es)? Potentially Addressing deficits in the following areas: balance, endurance, locomotion, strength, transferring, bowel/bladder control, bathing, dressing, feeding, grooming, toileting, cognition and psychosocial support 5. Can the patient actively participate in an intensive therapy program of at least 3 hrs of therapy per day at least 5 days per week? Potentially 6. The potential for patient to make measurable gains while on inpatient rehab is good and fair 7. Anticipated functional outcomes upon  discharge from inpatient rehab are supervision and min assist  with PT, supervision and min assist with OT, supervision with SLP. 8. Estimated rehab length of stay to reach the above functional goals is: potentially 14-20 days 9. Anticipated D/C setting: Home 10. Anticipated post D/C treatments: HH therapy and Outpatient therapy 11. Overall Rehab/Functional Prognosis: excellent  RECOMMENDATIONS: This patient's condition is appropriate for continued rehabilitative care in the following setting: potentially CIR pending medical stability.  Patient has agreed to participate in recommended program. Yes and Potentially Note that insurance prior authorization may be required for reimbursement for recommended care.  Comment: Rehab Admissions Coordinator to follow up.   Thanks,  Meredith Staggers, MD, Mellody Drown    Bary Leriche, PA-C 06/19/2017

## 2017-06-19 NOTE — Progress Notes (Signed)
Orthopedic Tech Progress Note Patient Details:  DEANIA SIGUENZA [birth year] [id number] Brace completed by bio-tech. Patient ID: ELINORE SHULTS, female   DOB: January 10, 1976, 42 y.o.   MRN: [id number]   Braulio Bosch 06/19/2017, 7:25 PM

## 2017-06-19 NOTE — Progress Notes (Signed)
[facility] Progress Note   Subjective:  Pain improved. No new complaints.    Objective Vitals:   06/18/17 1807 06/18/17 2113 06/19/17 0434 06/19/17 0957  BP: (!) 146/80 (!) 183/77 (!) 160/62 (!) 157/65  Pulse: 86 82 77 79  Resp: 18 17 18 16   Temp: 98.8 F (37.1 C) 98.6 F (37 C) 98.2 F (36.8 C) 98.3 F (36.8 C)  TempSrc: Oral Oral Oral Oral  SpO2: 95% 93% 92% 96%  Weight:      Height:       Physical Exam General:NAD, thin, chronically ill appearing female Heart:RRR, 2/6 systolic murmur. No rub or gallop  Lungs:CTAB Abdomen:soft, mildly distended, +BS Extremities:LLE: no edema, +vascular changes. RLE: s/p BKA wrapped Dialysis Access: LU AVF +b/t  Filed Weights   06/18/17 1700  Weight: 76.6 kg (168 lb 14.4 oz)    Intake/Output Summary (Last 24 hours) at 06/19/2017 1029 Last data filed at 06/19/2017 0957 Gross per 24 hour  Intake 120 ml  Output 4000 ml  Net -3880 ml    Additional Objective Labs: Basic Metabolic Panel: Recent Labs  Lab 06/13/17 0915 06/16/17 0639 06/17/17 0241 06/18/17 0612 06/19/17 0707  NA 137 137 135 135 133*  K 4.1 4.5 4.4 5.0 4.5  CL 96* 95* 97* 95* 93*  CO2 26 26 22 25 26   GLUCOSE 213* 231* 258* 94 176*  BUN 48* 40* 28* 40* 26*  CREATININE 5.56* 5.24* 3.98* 5.70* 4.07*  CALCIUM 8.2* 8.5* 8.3* 8.8* 8.6*  PHOS 5.3* 6.5*  --  6.9*  --    Liver Function Tests: Recent Labs  Lab 06/13/17 0915 06/16/17 0639 06/18/17 0612  AST 13*  --   --   ALT 11*  --   --   ALKPHOS 81  --   --   BILITOT 0.8  --   --   PROT 5.2*  --   --   ALBUMIN 1.8* 1.6* 1.6*   CBC: Recent Labs  Lab 06/13/17 0915 06/16/17 0639 06/17/17 0241 06/18/17 0612 06/19/17 0707  WBC 8.5 9.9 10.9* 11.7* 11.4*  HGB 7.3* 9.1* 9.2* 8.5* 8.5*  HCT 23.4* 29.6* 30.5* 27.7* 27.6*  MCV 90.7 90.0 91.0 89.6 90.5  PLT 214 271 277 265 267   Blood Culture    Component Value Date/Time   SDES FLUID 06/12/2017 1319   SDES FLUID PERITONEAL 06/12/2017  1319   SPECREQUEST NONE 06/12/2017 1319   SPECREQUEST BOTTLES DRAWN AEROBIC AND ANAEROBIC 06/12/2017 1319   CULT NO GROWTH 5 DAYS 06/12/2017 1319   REPTSTATUS 06/12/2017 FINAL 06/12/2017 1319   REPTSTATUS 06/17/2017 FINAL 06/12/2017 1319    CBG: Recent Labs  Lab 06/17/17 2111 06/18/17 1449 06/18/17 1652 06/18/17 2111 06/19/17 0740  GLUCAP 86 122* 111* 161* 170*   Lab Results  Component Value Date   INR 1.14 06/17/2017   INR 1.20 06/11/2017   INR 1.19 05/09/2017   Studies/Results: No results found.  Medications: . sodium chloride 10 mL/hr at 06/17/17 1146  . sodium chloride    . sodium chloride    . sodium chloride    . magnesium sulfate 1 - 4 g bolus IVPB     . amLODipine  10 mg Oral QHS  . atorvastatin  40 mg Oral q1800  . Chlorhexidine Gluconate Cloth  6 each Topical Daily  . cloNIDine  0.3 mg Oral TID  . darbepoetin (ARANESP) injection - DIALYSIS  200 mcg Intravenous Q Fri-HD  . docusate  100 mg Oral TID  .  feeding supplement (NEPRO CARB STEADY)  237 mL Oral BID BM  . feeding supplement (PRO-STAT SUGAR FREE 64)  30 mL Oral BID  . fentaNYL  25 mcg Transdermal Q72H  . heparin  5,000 Units Subcutaneous Q8H  . insulin aspart  0-9 Units Subcutaneous TID WC  . insulin glargine  14 Units Subcutaneous QHS  . insulin glargine  20 Units Subcutaneous Daily  . isosorbide dinitrate  60 mg Oral BID  . labetalol  300 mg Oral TID  . metoCLOPramide  2.5 mg Oral TID AC  . multivitamin  1 tablet Oral QHS  . mupirocin ointment  1 application Nasal BID  . pantoprazole  40 mg Oral Daily  . sertraline  50 mg Oral Daily  . sevelamer carbonate  2,400 mg Oral TID WC  . sodium chloride flush  3 mL Intravenous Q12H  . sodium hypochlorite   Irrigation BID    Dialysis Orders: MWF [facility] 4h 65min 400/800 85.5kg 2/2.25 bath L AVF P2 Hep 2000 - Mircera 290mcg IV q 2wks - supposed to start 06/12/17   Assessment/Plan: 1.Sacral decub/osteo- Wound care following. ID consulted,  stopped ABX, recc aggressive wound care, no indication osteo tx at this time. 2. Non healing R foot - s/p BKA1/22Dr. Brabham. Pain control improved.  3. Poorly healing R groin wound - wound vac placed, VVS following. ABX stopped by ID. 3. Fall/small IC bleed - MRI/cranial imaging can be done as outpatient per neurosurg 4. Ascites- s/p large vol paracentesis (9.8L) 1/17. Mildly distended abd. Per primary 5. ESRD- HD tolerated well yesterday. Will continue IP HD, while admitted, per regular MWF schedule. 6. Anemiaof CKD-Hgb 8.5, Aranesp 246mcg IV qwk started 1/18, but not given. Start tomorrow.  7. Secondary hyperparathyroidism- Cor Ca mildly elevated 10.5. P elevated. Continue binders, reinforce compliance. Not on VDRA. Change to 2K bath.  8.HTN, chronic/ severe -Chronically elevated BP. Reportedly does not do well with SBP<140. Continue home meds.  9. Volume - On exam volume appears stable. Continue to titrate down as tolerated.  10. Nutrition- Alb 1.6, Renavite, Renal diet with fluid restrictions. Prostat & Nepro. 11. CAD - per primary     Jen Mow, PA-C [facility] Pager: [phone] 06/19/2017,10:29 AM  LOS: 8 days   Pt seen, examined and agree w A/P as above.  Kelly Splinter MD [employer] pager [phone]   06/19/2017, 2:27 PM

## 2017-06-19 NOTE — Progress Notes (Signed)
Patient not willing to back to [facility]. States that she is here because of their negligence. As such, I will ask CIR to please reevaluate for admission as she does not have plans for discharge to SNF.  Vance Gather, MD 06/19/2017 3:57 PM

## 2017-06-19 NOTE — Clinical Social Work Note (Signed)
Clinical Social Work Assessment  Patient Details  Name: [name] MRN: [id number] Date of Birth: 07/29/1975  Date of referral:  06/19/17               Reason for consult:  Discharge Planning, Facility Placement                Permission sought to share information with:  Family Supports Permission granted to share information::  Yes, Verbal Permission Granted(Patient's husband was at the bedside and patient permitted him to actively particpate in the conversation.)  Name::     [name]  Agency::     Relationship::  Husband  Contact Information:  [phone]  Housing/Transportation Living arrangements for the past 2 months:  [place], Post-Acute Facility(Patient has been to [facility] within the past 2 months) Source of Information:  Patient, Spouse, Other (Comment Required)(Chart) Patient Interpreter Needed:  None Criminal Activity/Legal Involvement Pertinent to Current Situation/Hospitalization:  No - Comment as needed Significant Relationships:  Spouse Lives with:  Spouse Do you feel safe going back to the place where you live?  No(Patient and spouse in agreeement with continued rehab) Need for family participation in patient care:  Yes (Comment)  Care giving concerns:  Patient's husband has concerns regarding patient's care at [facility] and expressed that he does not want her to return there.  Social Worker assessment / plan:  CSW visited room and talked with patient and spouse regarding patient's readiness for discharge and returning to [facility]. Patient was in bed awake and alert, and allowed her husband to take the lead in the conversation. Mr. Genna strongly advised CSW that his wife will not return to this facility and informed CSW that due to bed rails not being in placed (MD had placed order for patient to have them, but they were not in place) his wife feel out of the bed on her head.   Mr. Derner also advised CSW that he was  unaware of his wife being ready for discharge and did not feel she was medically stable. Husband expressed that he wants LTAC for his wife as she has been to [facility] before. Nurse case manager later talked to Mr. Blea and advised him that patient was not appropriate for LTAC and why. CSW advised by MD that consult made to CIR. Mr. Hofmeister was also advised that a SNF search will be done and he was in agreement.     Employment status:  Disabled (Comment on whether or not currently receiving Disability) Insurance information:  Medicare, Managed Care PT Recommendations:  [place] / Referral to community resources:  [place]  Patient/Family's Response to care:  Patient and spouse did not express any concerns regarding her care during hospitalization.  Patient/Family's Understanding of and Emotional Response to Diagnosis, Current Treatment, and Prognosis:  Spouse appeared to have an understanding of why patient in the hospital, but did not understand why she was determined medically stable for discharge. Husband was also adamant that his wife would not return to [facility].   Emotional Assessment Appearance:  Appears older than stated age Attitude/Demeanor/Rapport:  Other(Appropriate) Affect (typically observed):  Appropriate Orientation:  Oriented to Self, Oriented to Place, Oriented to Situation, Oriented to  Time Alcohol / Substance use:  Tobacco Use, Alcohol Use, Illicit Drugs(Patient reported that she quit smoking and does not drink or use illicit drugs.) Psych involvement (Current and /or in the community):  No (Comment)  Discharge Needs  Concerns to  be addressed:  Discharge Planning Concerns Readmission within the last 30 days:  Yes Current discharge risk:  None Barriers to Discharge:  No SNF bed, Other(Husband does not want patient to return to [facility]. MD has made CIR consult.)   Sable Feil, LCSW 06/19/2017, 5:51 PM

## 2017-06-19 NOTE — Discharge Summary (Addendum)
Physician Discharge Summary  [name]:[id number] DOB: 1976/01/20 DOA: 06/11/2017  PCP: Merrilee Seashore, MD  Admit date: 06/11/2017 Discharge date: 06/23/2017  Admitted From: SNF Disposition: CIR  Recommendations for Outpatient Follow-up:  1. Follow up with PCP in 1-2 weeks 2. Continue routine HD MWF 3. Continue aggressive wound care for decubitus ulcer, right BKA, and wound vac for right groin wound per vascular surgery.  4. Follow up with vascular surgery as outpatient 5. Please obtain BMP/CBC in one week  Home Health: [name] Equipment/Devices: Wound vac to right groin 1/23.  Discharge Condition: Stable CODE STATUS: Full Diet recommendation: Renal  Brief/Interim Summary: [name] is a 42 y.o. female with a history of ESRD, CAD, severe PVD s/p right CFA, profunda, SFA and external iliac endarterectomy with bovine patch angioplasty on 25/36/64 complicated by perioperative cardiac arrests x2, VDRF, and poor wound healing at the right groin site. Wound vac was placed 12/14 and right TMA was performed. [facility] SNF 06/10/2017 where she continued to have poor wound healing and presented to the ED 1/16 after a fall from ground level with head trauma. CT head demonstrated an 8 mm hyperdense lesion concerning for hemorrhagic mass and parietal area. Initially with headache, this has resolved and she has no focal deficits. MRI was ordered as recommended by neurosurgery, though was unable to be performed due to poor patient tolerance. Volume overload was suspected at admission with large volume ascites present, undergoing 9.8L paracentesis 1/17. CT of the pelvis demonstrated osteomyelitis, IV antibiotics started and ID ultimately consulted. 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  No indication for antibiotics at this time. - Offload as able  Poorly healing right groin wound:  - Wound vac to remain in place per vascular surgery.  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  - Continue pain control with fentanyl patch and oxycodone 15mg  prn breakthrough pain (#12). Was also given IV dilaudid while here. - Continue fentanyl patch 36mcg q72h (last placed 1/22)  ESRD on hemodialysis:  - Continue MWF HD  ICH s/p fall with head trauma: ~8 mm in size in the parietal lobe. No symptoms currently.  - Neurosurgery recommended MRI which the patient wishes to pursue as an outpatient due to pain laying flat. Touched base with neurosurgery prior to discharge to confirm. Dr. Christella Noa states "The next cranial imaging can be done as an outpatient. It is not necessary now."  Chronic systolic CHF: EF 40-34%. Euvolemic.  - Continue HD for fluid removal, also received paracentesis 1/17 for ascites. SAAG 0.4, possibly due to renal failure, cytology negative.  - Monitor abdomen, consider repeat paracentesis if needed for therapeutic purposes and repeat cytology.  - Fluid restriction.  Anemia of chronic disease: Likely from her ESRD. Hgb stable without any obvious signs of blood loss.  - Slight hgb drop but is now stable at 7.7. - Recheck CBC in 1 week   HTN - All of her home meds are resumed including labetalol,  clonidine, Norvasc, hydralazine, imdur. Hold spironolactone with K 5. May add back as needed.  - Continue HD per nephro.  Discharge Instructions Discharge Instructions    Increase activity slowly   Complete by:  As directed      Allergies as of 06/23/2017      Reactions   Cephalexin Other (See Comments)   Reaction unknown- Childhood allergy Tolerated Ceftriaxone in the past   Sulfamethoxazole-trimethoprim Other (See Comments)   Unknown reaction. Pt states that she was told by her mother that she had allergy to Bactrim as a child.      Medication List    STOP taking these medications   spironolactone 100 MG tablet Commonly known as:  ALDACTONE     TAKE these medications   amLODipine 10 MG tablet Commonly known as:  NORVASC Take 10 mg by mouth 2 (two) times daily.   ARANESP (ALBUMIN FREE) 200 MCG/ML Soln Generic drug:  Darbepoetin Alfa Inject 200 mg as directed. Give at dialysis on Wednesday   aspirin 81 MG EC tablet Take 1 tablet (81 mg total) by mouth daily.   atorvastatin 40 MG tablet Commonly known as:  LIPITOR Take 40 mg by mouth daily at 6 PM.   cinacalcet 30 MG tablet Commonly known as:  SENSIPAR Take 2 tablets (60 mg total) by mouth daily with supper.   cloNIDine 0.3 MG tablet Commonly known as:  CATAPRES Take 0.3 mg by mouth 3 (three) times daily.   docusate 50 MG/5ML liquid Commonly known as:  COLACE Take 100 mg by mouth 3 (three) times daily.   feeding supplement (NEPRO CARB STEADY) Liqd Take 237 mLs by mouth 2 (two) times daily between meals. Start taking on:  06/24/2017   feeding supplement (PRO-STAT SUGAR FREE 64) Liqd Take 30 mLs by mouth 2 (two) times daily.   fentaNYL 25 MCG/HR patch Commonly known as:  DURAGESIC - dosed mcg/hr Place 1 patch (25 mcg total) onto the skin every 3 (three) days.   ferric citrate 1 GM 210 MG(Fe) tablet Commonly known as:  AURYXIA Take 2 tablets (420 mg total) by mouth 3 (three) times daily with meals.    hydrALAZINE 100 MG tablet Commonly known as:  APRESOLINE Take 1 tablet (100 mg total) by mouth every 8 (eight) hours.   isosorbide dinitrate 30 MG tablet Commonly known as:  ISORDIL Take 60 mg by mouth 2 (two) times daily. Give two 30 mg tablets to = 60 mg BID   labetalol 300 MG tablet Commonly known as:  NORMODYNE Take 1 tablet (300 mg total) by mouth 3 (three) times daily.   LANTUS SOLOSTAR 100 UNIT/ML Solostar Pen Generic drug:  Insulin Glargine Inject 14 Units into the skin daily at 10 pm.   LANTUS SOLOSTAR 100 UNIT/ML Solostar Pen Generic drug:  Insulin Glargine Inject 20 Units into the skin every morning.   LORazepam 1 MG tablet Commonly known as:  ATIVAN Take 1 mg by mouth every 6 (six) hours as needed for anxiety.   metoCLOPramide 10 MG tablet Commonly known as:  REGLAN Take 10 mg by mouth 3 (three) times daily.   b complex-vitamin c-folic acid 0.8 MG Tabs tablet Take 1 tablet by mouth daily.  multivitamin Tabs tablet Take 1 tablet by mouth at bedtime.   ondansetron 4 MG tablet Commonly known as:  ZOFRAN Take 4 mg by mouth every 6 (six) hours as needed for nausea or vomiting.   oxyCODONE 15 MG immediate release tablet Commonly known as:  ROXICODONE Take 1 tablet (15 mg total) by mouth every 6 (six) hours as needed for breakthrough pain. What changed:    medication strength  how much to take  reasons to take this   pantoprazole 40 MG tablet Commonly known as:  PROTONIX Take 40 mg by mouth daily.   sertraline 50 MG tablet Commonly known as:  ZOLOFT Take 50 mg by mouth daily.   sevelamer carbonate 800 MG tablet Commonly known as:  RENVELA Take 2,400 mg by mouth 3 (three) times daily with meals. Give 3 tablets to = 2400 mg TID            Durable Medical Equipment  (From admission, onward)        Start     Ordered   06/19/17 1624  For home use only DME lightweight manual wheelchair with seat cushion  Once    Comments:  Patient suffers  from right BKA, sacral decubitus ulcer which impairs their ability to perform daily activities like bathing, dressing, grooming, freedom and toileting in the home.  A walker will not resolve  issue with performing activities of daily living. A wheelchair will allow patient to safely perform daily activities. Patient is not able to propel themselves in the home using a standard weight wheelchair due to endurance, weakness of UE hospitalized for > 1 month. Patient can self propel in the lightweight wheelchair.  She is over 6 feet tall and needs to be measured for a wc.   Accessories: elevating leg platform for right BKA, wheel locks, extensions and anti-tippers.  Seat cushion needs to be gel or similar for decubitus ulcer.   06/19/17 1628   06/19/17 1530  For home use only DME Negative pressure wound device  Once    Question Answer Comment  Frequency of dressing change 3 times per week   Length of need 3 Months   Dressing type Foam   Amount of suction 125 mm/Hg   Pressure application Continuous pressure   Supplies 10 canisters and 15 dressings per month for duration of therapy      06/19/17 1529     Follow-up Information    Merrilee Seashore, MD Follow up.   Specialty:  Internal Medicine Contact information: [address] [place] [address] [phone]        Serafina Mitchell, MD Follow up.   Specialties:  Vascular Surgery, Cardiology Contact information: [address] [place] [address] [phone]          Allergies  Allergen Reactions  . Cephalexin Other (See Comments)    Reaction unknown- Childhood allergy Tolerated Ceftriaxone in the past  . Sulfamethoxazole-Trimethoprim Other (See Comments)    Unknown reaction. Pt states that she was told by her mother that she had allergy to Bactrim as a child.    Consultations:  Vascular surgery  Infectious disease  Nephrology  Procedures/Studies: Ct Head Wo Contrast  Result Date:  06/11/2017 CLINICAL DATA:  [place] and hit head EXAM: CT HEAD WITHOUT CONTRAST TECHNIQUE: Contiguous axial images were obtained from the base of the skull through the vertex without intravenous contrast. COMPARISON:  CT brain 02/02/2017 FINDINGS: Brain: 8 mm hyperdense focus within the left posterior parietal lobe with  surrounding edema. No midline shift. The ventricles are of normal size. Vascular: No hyperdense vessels. Carotid artery calcification. Numerous small vessel calcifications at the skull base. Skull: Increased bilateral mastoid effusion.  No fracture Sinuses/Orbits: Mucosal thickening in the left maxillary and ethmoid sinuses. No acute orbital abnormality Other: None IMPRESSION: Interim development of an 8 mm hyperdense focus within the left posterior parietal region with mild surrounding edema. Appearance is suspicious for small hemorrhagic mass. MRI is suggested for further evaluation. Critical Value/emergent results were called by telephone at the time of interpretation on 06/11/2017 at 6:49 pm to Dr. Dorie Rank , who verbally acknowledged these results. Electronically Signed   By: Donavan Foil M.D.   On: 06/11/2017 18:49   Ct Pelvis Wo Contrast  Result Date: 06/11/2017 CLINICAL DATA:  [place] out of bed, questionable hip fracture EXAM: CT PELVIS WITHOUT CONTRAST TECHNIQUE: Multidetector CT imaging of the pelvis was performed following the standard protocol without intravenous contrast. COMPARISON:  Radiograph 06/11/2017, CT 04/18/2017 FINDINGS: Urinary Tract:  No abnormality visualized. Bowel:  Unremarkable visualized pelvic bowel loops. Vascular/Lymphatic: Extensive atherosclerotic calcifications. No grossly enlarged lymph nodes. Reproductive:  No mass or other significant abnormality Other:  Large volume of ascites in the pelvis.  Diffuse anasarca. Musculoskeletal: No definitive fracture is seen. Deep sacral decubitus ulcer down to the sacrococcygeal tip. Interval bone resorption and erosive change  at the coccyx. IMPRESSION: 1. No definite acute pelvic fracture is seen 2. Deep sacral decubitus ulcer, down to the sacrococcygeal bone. New bony resorptive and erosive changes at the coccyx, concerning for osteomyelitis. 3. Anasarca.  Large volume of ascites in the pelvis. Electronically Signed   By: Donavan Foil M.D.   On: 06/11/2017 23:44   US Abdomen Limited  Result Date: 05/30/2017 CLINICAL DATA:  Assess for ascites EXAM: LIMITED ABDOMEN ULTRASOUND FOR ASCITES TECHNIQUE: Limited ultrasound survey for ascites was performed in all four abdominal quadrants. COMPARISON:  04/18/2017 FINDINGS: Moderate amount of ascites is identified. The overall appearance may be slightly greater than that seen on the prior CT examination. IMPRESSION: Overall slight increase in ascites when compared with the prior CT. Electronically Signed   By: Inez Catalina M.D.   On: 05/30/2017 14:17   Dg Knee Complete 4 Views Right  Result Date: 06/11/2017 CLINICAL DATA:  Fall with knee pain EXAM: RIGHT KNEE - COMPLETE 4+ VIEW COMPARISON:  None. FINDINGS: No fracture or malalignment is seen. Mild patellofemoral degenerative changes. Extensive vascular calcifications. No large effusion. IMPRESSION: Minimal degenerative changes.  No acute osseous abnormality. Electronically Signed   By: Donavan Foil M.D.   On: 06/11/2017 18:56   Dg Hip Unilat W Or Wo Pelvis 2-3 Views Right  Result Date: 06/11/2017 CLINICAL DATA:  Fall with right hip and knee pain EXAM: DG HIP (WITH OR WITHOUT PELVIS) 2-3V RIGHT COMPARISON:  None. FINDINGS: No acute fracture or dislocation of the right hip. Extensive vascular calcifications. A lucency at the right inferior pubic ramus is noted. Pubic symphysis is intact. Extensive vascular calcification IMPRESSION: 1. No fracture or malalignment of the proximal right femur 2. Lucency over the right inferior pubic ramus, uncertain if this is related to skin fold artifact or a nondisplaced fracture. Electronically Signed    By: Donavan Foil M.D.   On: 06/11/2017 18:55   Ir Paracentesis  Result Date: 06/12/2017 INDICATION: Abdominal distention secondary to ascites. Request for diagnostic and therapeutic paracentesis. EXAM: ULTRASOUND GUIDED RIGHT LOWER QUADRANT PARACENTESIS MEDICATIONS: None. COMPLICATIONS: None immediate. PROCEDURE: Informed written consent was obtained  from the patient after a discussion of the risks, benefits and alternatives to treatment. A timeout was performed prior to the initiation of the procedure. Initial ultrasound scanning demonstrates a large amount of ascites within the right lower abdominal quadrant. The right lower abdomen was prepped and draped in the usual sterile fashion. 1% lidocaine with epinephrine was used for local anesthesia. Following this, a 19 gauge, 10-cm, Yueh catheter was introduced. An ultrasound image was saved for documentation purposes. The paracentesis was performed. The catheter was removed and a dressing was applied. The patient tolerated the procedure well without immediate post procedural complication. FINDINGS: A total of approximately 9.8 L of hazy, dark yellow fluid was removed. Samples were sent to the laboratory as requested by the clinical team. IMPRESSION: Successful ultrasound-guided paracentesis yielding 9.8 liters of peritoneal fluid. Read by: Ascencion Dike PA-C Electronically Signed   By: Aletta Edouard M.D.   On: 06/12/2017 13:25    06/17/17 AMPUTATION BELOW KNEE RIGHT Serafina Mitchell, MD   Subjective: Pain is much better controlled, feels sacral decubitus is more sore than previously, started worsening the day antibiotics were stopped. No fever.   Discharge Exam: BP (!) 173/78 (BP Location: Right Arm)   Pulse 69   Temp 97.6 F (36.4 C) (Oral)   Resp 14   Ht 6\' 4"  (1.93 m)   Wt 76.8 kg (169 lb 6.4 oz)   LMP 10/09/2015 Comment: pt on dialysis  SpO2 95%   BMI 20.62 kg/m   Gen: Chronically ill-appearing female in no distress HEENT: MMM,  posterior oropharynx clear Pulm: Non-labored and clear CV: Regular rate, III/VI systolic hum; distal pulses intact/symmetric GI: + BS; soft, mild fluid wave without distention. Not tender.  Skin: As below. Right ischial decubitus wound pictured below with scant slough superiorly, no malodor or purulence. Neuro: A&Ox3, CN II-XII without deficits Extremities: Right BKA with ACE wrap. Right groin wound unchanged. LUE AVF +bruit/thrill.    06/23/2017  Labs: Basic Metabolic Panel: Recent Labs  Lab 06/17/17 0241 06/18/17 0612 06/19/17 0707 06/20/17 0755 06/23/17 0708  NA 135 135 133* 133* 134*  K 4.4 5.0 4.5 4.9 4.9  CL 97* 95* 93* 92* 92*  CO2 22 25 26 24 25   GLUCOSE 258* 94 176* 181* 94  BUN 28* 40* 26* 48* 53*  CREATININE 3.98* 5.70* 4.07* 5.57* 6.20*  CALCIUM 8.3* 8.8* 8.6* 8.7* 8.6*  PHOS  --  6.9*  --  8.0* 7.4*   Liver Function Tests: Recent Labs  Lab 06/18/17 0612 06/20/17 0755 06/23/17 0708  ALBUMIN 1.6* 1.7* 1.6*   CBC: Recent Labs  Lab 06/17/17 0241 06/18/17 0612 06/19/17 0707 06/20/17 0754 06/23/17 0708  WBC 10.9* 11.7* 11.4* 11.1* 9.9  HGB 9.2* 8.5* 8.5* 7.7* 7.7*  HCT 30.5* 27.7* 27.6* 24.1* 25.1*  MCV 91.0 89.6 90.5 89.6 90.6  PLT 277 265 267 260 267   Microbiology Recent Results (from the past 240 hour(s))  Surgical pcr screen     Status: Abnormal   Collection Time: 06/16/17  7:33 PM  Result Value Ref Range Status   MRSA, PCR NEGATIVE NEGATIVE Final   Staphylococcus aureus POSITIVE (A) NEGATIVE Final    Comment: (NOTE) The Xpert SA Assay (FDA approved for NASAL specimens in patients 6 years of age and older), is one component of a comprehensive surveillance program. It is not intended to diagnose infection nor to guide or monitor treatment.     Time coordinating discharge: Approximately 40 minutes  Vance Gather, MD  [facility] 06/23/2017, 1:41 PM Pager [phone]

## 2017-06-19 NOTE — Progress Notes (Signed)
Tech offered Pt a bath. Pt stated that she had already had a bath. Family assisted her with a wash-up before leaving this morning.

## 2017-06-20 ENCOUNTER — Telehealth: Payer: Self-pay | Admitting: Surgery

## 2017-06-20 LAB — RENAL FUNCTION PANEL
Albumin: 1.7 g/dL — ABNORMAL LOW (ref 3.5–5.0)
Anion gap: 17 — ABNORMAL HIGH (ref 5–15)
BUN: 48 mg/dL — ABNORMAL HIGH (ref 6–20)
CHLORIDE: 92 mmol/L — AB (ref 101–111)
CO2: 24 mmol/L (ref 22–32)
CREATININE: 5.57 mg/dL — AB (ref 0.44–1.00)
Calcium: 8.7 mg/dL — ABNORMAL LOW (ref 8.9–10.3)
GFR calc Af Amer: 10 mL/min — ABNORMAL LOW (ref >60)
GFR, EST NON AFRICAN AMERICAN: 9 mL/min — AB (ref >60)
Glucose, Bld: 181 mg/dL — ABNORMAL HIGH (ref 65–99)
Phosphorus: 8 mg/dL — ABNORMAL HIGH (ref 2.5–4.6)
Potassium: 4.9 mmol/L (ref 3.5–5.1)
Sodium: 133 mmol/L — ABNORMAL LOW (ref 135–145)

## 2017-06-20 LAB — CBC
HCT: 24.1 % — ABNORMAL LOW (ref 36.0–46.0)
Hemoglobin: 7.7 g/dL — ABNORMAL LOW (ref 12.0–15.0)
MCH: 28.6 pg (ref 26.0–34.0)
MCHC: 32 g/dL (ref 30.0–36.0)
MCV: 89.6 fL (ref 78.0–100.0)
PLATELETS: 260 10*3/uL (ref 150–400)
RBC: 2.69 MIL/uL — ABNORMAL LOW (ref 3.87–5.11)
RDW: 16.9 % — AB (ref 11.5–15.5)
WBC: 11.1 10*3/uL — AB (ref 4.0–10.5)

## 2017-06-20 LAB — GLUCOSE, CAPILLARY
GLUCOSE-CAPILLARY: 131 mg/dL — AB (ref 65–99)
GLUCOSE-CAPILLARY: 224 mg/dL — AB (ref 65–99)

## 2017-06-20 MED ORDER — ALTEPLASE 2 MG IJ SOLR: 2.0000 mg | Freq: Once | INTRAMUSCULAR | Status: DC | PRN

## 2017-06-20 MED ORDER — LIDOCAINE-PRILOCAINE 2.5-2.5 % EX CREA: 1.0000 "application " | TOPICAL_CREAM | CUTANEOUS | Status: DC | PRN

## 2017-06-20 MED ORDER — DARBEPOETIN ALFA 200 MCG/0.4ML IJ SOSY
PREFILLED_SYRINGE | INTRAMUSCULAR | Status: AC
  Administered 2017-06-20: 200 ug via INTRAVENOUS
  Filled 2017-06-20: qty 0.4

## 2017-06-20 MED ORDER — PENTAFLUOROPROP-TETRAFLUOROETH EX AERO: 1.0000 "application " | INHALATION_SPRAY | CUTANEOUS | Status: DC | PRN

## 2017-06-20 MED ORDER — HYDROMORPHONE HCL 1 MG/ML IJ SOLN
INTRAMUSCULAR | Status: AC
  Administered 2017-06-20: 1 mg via INTRAVENOUS
  Filled 2017-06-20: qty 1

## 2017-06-20 MED ORDER — SODIUM CHLORIDE 0.9 % IV SOLN: 100.0000 mL | INTRAVENOUS | Status: DC | PRN

## 2017-06-20 MED ORDER — LIDOCAINE HCL (PF) 1 % IJ SOLN: 5.0000 mL | INTRAMUSCULAR | Status: DC | PRN

## 2017-06-20 MED ORDER — HEPARIN SODIUM (PORCINE) 1000 UNIT/ML DIALYSIS: 1000.0000 [IU] | INTRAMUSCULAR | Status: DC | PRN

## 2017-06-20 NOTE — Progress Notes (Signed)
PT Cancellation Note  Patient Details Name: [name] MRN: [id number] DOB: 08/02/1975   Cancelled Treatment:    Reason Eval/Treat Not Completed: Patient at procedure or test/unavailable. Pt at HD. PT will continue to f/u with pt as available.    [place] 06/20/2017, 8:51 AM

## 2017-06-20 NOTE — Consult Note (Signed)
   [facility] CM Inpatient Consult   06/20/2017  [name] November 02, 1975 [id number]  Patient reviewed for multiple admissions and ED visits in the [facility] of the [address] Management services under patient's Medicare plan. Patient's HX includes but not limited to this 42 year old with ESRD on HD, CAD, severe PVD with right BKA. Now with a brain mass on CT scan. Current disposition is for CIR vs SNF.  Patient returning from HD. Will follow for disposition. For questions contact:   Natividad Brood, RN BSN [facility] Liaison  [phone] business mobile phone Toll free office [phone]

## 2017-06-20 NOTE — Telephone Encounter (Signed)
Sched appt 06/23/17 at 1:45. Lm on cell# for pt to confirm appt.

## 2017-06-20 NOTE — Consult Note (Signed)
[facility] [facility] Nurse wound follow up Wound type: surgical & Stage 4 Pressure injury left ischium  She underwent an AKA this week on the right LE. Measurement: Right groin: 5cm x 1cm x 0.5cm Left ischium: 8cm x 4cm x 3.0cm   Wound bed: Right groin 95% clean, pink, granulation tissue, 5% yellow  Left ischium: much cleaner, pink, moist, 80% pink/20% yellow at proximal edge of wound bed Drainage (amount, consistency, odor)  Right groin: none in canister or documented on nursing flow sheets Left ischium: moderate, not purulent, serosanguinous, no odor Periwound: intact at each site  Dressing procedure/placement/frequency: 1pc of black foam used to fill linear wound bed, 1/4 of ostomy barrier ring used on each end (distal and proximal) of the incision to aid in seal in groin. Sealed at 159mmHG.   Pt tolerated well.  Continue Dakin's solution to the left ischium, once clean ok to switch to absorptive dressing. Needs low air loss mattress in whatever DC situation she ends up in  Needs pressure redistribution cushion for WC if she is up for HD.   Wounds will not heal without pressure redistribution.  Maximize nutrition for wound healing. [place] nurse will follow along for M/W/F VAC dressing changes and to reassess left ischial wound weekly for needed changes in topical care.  [place] MSN, [place], [place], [place]

## 2017-06-20 NOTE — Telephone Encounter (Signed)
-----   Message from Mena Goes, RN sent at 06/19/2017  3:48 PM EST ----- Regarding: may be duplicate   ----- Message ----- From: Elam Dutch, MD Sent: 06/19/2017   3:20 PM To: [facility] Charge Pool  Please arrange follow up with Dr Trula Slade to recheck groin wound in 1-2 weeks  Ruta Hinds

## 2017-06-20 NOTE — Progress Notes (Signed)
  Progress Note    06/20/2017 9:49 AM 3 Days Post-Op  Subjective:  Pt seen on HD-C/o phantom pain  Afebrile  Vitals:   06/20/17 0419 06/20/17 0546  BP: (!) 151/59 (!) 180/77  Pulse: 70 78  Resp: 18 19  Temp: 97.9 F (36.6 C) 98.1 F (36.7 C)  SpO2: 100% 98%    Physical Exam: Incisions:  Bandage in place with stump sock-did not take down dressing in HD   CBC    Component Value Date/Time   WBC 11.1 (H) 06/20/2017 0754   RBC 2.69 (L) 06/20/2017 0754   HGB 7.7 (L) 06/20/2017 0754   HCT 24.1 (L) 06/20/2017 0754   PLT 260 06/20/2017 0754   MCV 89.6 06/20/2017 0754   MCH 28.6 06/20/2017 0754   MCHC 32.0 06/20/2017 0754   RDW 16.9 (H) 06/20/2017 0754   LYMPHSABS 0.9 05/13/2017 0746   MONOABS 1.0 05/13/2017 0746   EOSABS 0.3 05/13/2017 0746   BASOSABS 0.0 05/13/2017 0746    BMET    Component Value Date/Time   NA 133 (L) 06/20/2017 0755   K 4.9 06/20/2017 0755   CL 92 (L) 06/20/2017 0755   CO2 24 06/20/2017 0755   GLUCOSE 181 (H) 06/20/2017 0755   BUN 48 (H) 06/20/2017 0755   CREATININE 5.57 (H) 06/20/2017 0755   CALCIUM 8.7 (L) 06/20/2017 0755   GFRNONAA 9 (L) 06/20/2017 0755   GFRAA 10 (L) 06/20/2017 0755    INR    Component Value Date/Time   INR 1.14 06/17/2017 0241     Intake/Output Summary (Last 24 hours) at 06/20/2017 0949 Last data filed at 06/20/2017 0600 Gross per 24 hour  Intake 180 ml  Output 0 ml  Net 180 ml     Assessment/Plan:  42 y.o. female is s/p right below knee amputation  3 Days Post-Op  -pt doing well on dialysis -dressings not removed in HD -having some phantom pain-may benefit from Neurontin.    [name], PA-C [facility] [phone] 06/20/2017 9:49 AM

## 2017-06-20 NOTE — Progress Notes (Signed)
[facility] Progress Note   Subjective:  Pain improved. No new complaints.    Objective Vitals:   06/19/17 1645 06/19/17 2100 06/20/17 0419 06/20/17 0546  BP: (!) 178/71 (!) 196/79 (!) 151/59 (!) 180/77  Pulse: 75 75 70 78  Resp: 18 19 18 19   Temp: 98.3 F (36.8 C) 97.6 F (36.4 C) 97.9 F (36.6 C) 98.1 F (36.7 C)  TempSrc: Oral Oral Oral Oral  SpO2: 96% 92% 100% 98%  Weight:      Height:       Physical Exam General:NAD, thin, chronically ill appearing female Heart:RRR, 2/6 systolic murmur. No rub or gallop  Lungs:CTAB Abdomen:soft, mildly distended, +BS Extremities:LLE: no edema, +vascular changes. RLE: s/p BKA wrapped Dialysis Access: LU AVF +b/t  Filed Weights   06/18/17 1700  Weight: 76.6 kg (168 lb 14.4 oz)    Intake/Output Summary (Last 24 hours) at 06/20/2017 0851 Last data filed at 06/20/2017 0600 Gross per 24 hour  Intake 180 ml  Output 0 ml  Net 180 ml    Additional Objective Labs: Basic Metabolic Panel: Recent Labs  Lab 06/16/17 0639  06/18/17 0612 06/19/17 0707 06/20/17 0755  NA 137   < > 135 133* 133*  K 4.5   < > 5.0 4.5 4.9  CL 95*   < > 95* 93* 92*  CO2 26   < > 25 26 24   GLUCOSE 231*   < > 94 176* 181*  BUN 40*   < > 40* 26* 48*  CREATININE 5.24*   < > 5.70* 4.07* 5.57*  CALCIUM 8.5*   < > 8.8* 8.6* 8.7*  PHOS 6.5*  --  6.9*  --  8.0*   < > = values in this interval not displayed.   Liver Function Tests: Recent Labs  Lab 06/13/17 0915 06/16/17 0639 06/18/17 0612 06/20/17 0755  AST 13*  --   --   --   ALT 11*  --   --   --   ALKPHOS 81  --   --   --   BILITOT 0.8  --   --   --   PROT 5.2*  --   --   --   ALBUMIN 1.8* 1.6* 1.6* 1.7*   CBC: Recent Labs  Lab 06/13/17 0915 06/16/17 0639 06/17/17 0241 06/18/17 0612 06/19/17 0707  WBC 8.5 9.9 10.9* 11.7* 11.4*  HGB 7.3* 9.1* 9.2* 8.5* 8.5*  HCT 23.4* 29.6* 30.5* 27.7* 27.6*  MCV 90.7 90.0 91.0 89.6 90.5  PLT 214 271 277 265 267   Blood Culture     Component Value Date/Time   SDES FLUID 06/12/2017 1319   SDES FLUID PERITONEAL 06/12/2017 1319   SPECREQUEST NONE 06/12/2017 1319   SPECREQUEST BOTTLES DRAWN AEROBIC AND ANAEROBIC 06/12/2017 1319   CULT NO GROWTH 5 DAYS 06/12/2017 1319   REPTSTATUS 06/12/2017 FINAL 06/12/2017 1319   REPTSTATUS 06/17/2017 FINAL 06/12/2017 1319    CBG: Recent Labs  Lab 06/18/17 2111 06/19/17 0740 06/19/17 1146 06/19/17 1644 06/19/17 2112  GLUCAP 161* 170* 149* 171* 144*   Lab Results  Component Value Date   INR 1.14 06/17/2017   INR 1.20 06/11/2017   INR 1.19 05/09/2017   Studies/Results: No results found.  Medications: . sodium chloride 10 mL/hr at 06/17/17 1146  . sodium chloride    . sodium chloride    . sodium chloride    . sodium chloride    . sodium chloride    . magnesium sulfate 1 -  4 g bolus IVPB     . amLODipine  10 mg Oral QHS  . atorvastatin  40 mg Oral q1800  . Chlorhexidine Gluconate Cloth  6 each Topical Daily  . cloNIDine  0.3 mg Oral TID  . darbepoetin (ARANESP) injection - DIALYSIS  200 mcg Intravenous Q Fri-HD  . docusate  100 mg Oral TID  . fentaNYL  25 mcg Transdermal Q72H  . heparin  5,000 Units Subcutaneous Q8H  . insulin aspart  0-9 Units Subcutaneous TID WC  . insulin glargine  14 Units Subcutaneous QHS  . insulin glargine  20 Units Subcutaneous Daily  . isosorbide dinitrate  60 mg Oral BID  . labetalol  300 mg Oral TID  . metoCLOPramide  2.5 mg Oral TID AC  . multivitamin  1 tablet Oral QHS  . mupirocin ointment  1 application Nasal BID  . pantoprazole  40 mg Oral Daily  . sertraline  50 mg Oral Daily  . sevelamer carbonate  2,400 mg Oral TID WC  . sodium chloride flush  3 mL Intravenous Q12H  . sodium hypochlorite   Irrigation BID    Dialysis Orders: MWF NW 4h 36min 400/800 85.5kg 2/2.25 bath L AVF P2 Hep 2000 - Mircera 245mcg IV q 2wks - supposed to start 06/12/17   Assessment: 1.Sacral decub/osteo- Wound care, abx stopped per  ID 2. Non healing R foot - s/p BKA1/22 3. Poorly healing R groin wound - wound vac placed, ABX stopped by ID. 3. Fall/small IC bleed - MRI/cranial imaging can be done as outpatient per neurosurg 4. Ascites- s/p large vol paracentesis (9.8L) 1/17. Mildly distended abd. Per primary 5. ESRD- HD MWF 6. Anemiaof CKD-Hgb 8.5, Aranesp 280mcg IV qwk started 1/18, but not given. Start tomorrow.  7. Secondary hyperparathyroidism- Cor Ca mildly elevated 10.5. P elevated. Continue binders, reinforce compliance. Not on VDRA. Change to 2K bath.  8.HTN, chronic/ severe -Chronically elevated BP.  Try to SBP > 140 on HD. Continue home meds.  9. Volume - weights are labile, looks euvolemic on exam  10. Nutrition- Alb 1.6, Renavite, Renal diet with fluid restrictions. Prostat & Nepro. 11. CAD - per primary    P - HD today, pending new SNF   Kelly Splinter MD [place] pager [phone]   06/20/2017, 8:51 AM

## 2017-06-20 NOTE — Progress Notes (Signed)
[facility] PROGRESS NOTE  [name]  ZBM:[id number] DOB: 1976-01-22 DOA: 06/11/2017 PCP: Merrilee Seashore, MD  Subjective: Seen in HD, sleeping, feels tired/wiped out. No changes overnight. Pain is severe around surgical site but controlled with medications.   Objective: BP (!) 164/72   Pulse 72   Temp 98 F (36.7 C) (Oral)   Resp 18   Ht 6\' 4"  (1.93 m)   Wt 76.6 kg (168 lb 14.4 oz)   LMP 10/09/2015 Comment: pt on dialysis  SpO2 99%   BMI 20.56 kg/m   Gen: Tired, chronically ill-appearing Pulm: Clear, nonlabored CV: RRR, no murmur, no JVD, no edema GI: Soft, distended but stable, not tender, +BS.  Neuro: Drowsy. No new deficits. Ext: LLE warm with diffuse muscle atrophy. RLE with dry dressing c/d/i on BKA site, mild flexion contracture noted.   Assessment & Plan: Appreciate PM&R consultation. Pt is medically stable for discharge, awaiting disposition. Family would prefer CIR, awaiting rehab admission coordinator follow up.   No other changes to the plan at this time.   Vance Gather, MD [facility]

## 2017-06-21 LAB — GLUCOSE, CAPILLARY
GLUCOSE-CAPILLARY: 199 mg/dL — AB (ref 65–99)
GLUCOSE-CAPILLARY: 198 mg/dL — AB (ref 65–99)
GLUCOSE-CAPILLARY: 157 mg/dL — AB (ref 65–99)
Glucose-Capillary: 175 mg/dL — ABNORMAL HIGH (ref 65–99)

## 2017-06-21 MED ORDER — PRO-STAT SUGAR FREE PO LIQD
30.0000 mL | Freq: Two times a day (BID) | ORAL | Status: DC
  Filled 2017-06-21 (×3): qty 30

## 2017-06-21 MED ORDER — NEPRO/CARBSTEADY PO LIQD
237.0000 mL | Freq: Two times a day (BID) | ORAL | Status: DC
  Filled 2017-06-21 (×8): qty 237

## 2017-06-21 NOTE — Progress Notes (Signed)
[facility]  Subjective  - right leg hurts   Objective (!) 193/80 79 98.8 F (37.1 C) (Oral) 18 94%  Intake/Output Summary (Last 24 hours) at 06/21/2017 1047 Last data filed at 06/21/2017 9201 Gross per 24 hour  Intake 120 ml  Output 0 ml  Net 120 ml   Right BKA clean incision healing Small amount of bloody drainage centrally VAC on right groin  Assessment/Planning: Healing BKA Dr Trula Slade to recheck Monday  [name] 06/21/2017 10:47 AM --  Laboratory Lab Results: Recent Labs    06/19/17 0707 06/20/17 0754  WBC 11.4* 11.1*  HGB 8.5* 7.7*  HCT 27.6* 24.1*  PLT 267 260   BMET Recent Labs    06/19/17 0707 06/20/17 0755  NA 133* 133*  K 4.5 4.9  CL 93* 92*  CO2 26 24  GLUCOSE 176* 181*  BUN 26* 48*  CREATININE 4.07* 5.57*  CALCIUM 8.6* 8.7*    COAG Lab Results  Component Value Date   INR 1.14 06/17/2017   INR 1.20 06/11/2017   INR 1.19 05/09/2017   No results found for: PTT

## 2017-06-21 NOTE — Progress Notes (Signed)
[facility] Progress Note   Dialysis Orders: MWF NW 4h 26min 400/800 85.5kg 2/2.25 bath L AVF P2 Hep 2000 - Mircera 260mcg IV q 2wks - supposed to start 06/12/17  Assessment/Plan: 1.Sacral decub/osteo-Wound care, abx stopped per ID 2. Non healing R foot - s/p BKA1/22 3. Poorly healing R groin wound - wound vac placed, ABX stopped by ID. 3. Fall/small IC bleed -MRI/cranial imaging can be done as outpatient per neurosurg 4. Ascites- s/p large vol paracentesis (9.8L) 1/17. Mildly distended abd. Per primary 5. ESRD-HD MWF K 4.9  Last Mg 1.7 6. Anemiaof CKD-Hgb 7.7, Aranesp 279mcg IV q Friday - 7. Secondary hyperparathyroidism- . Corrected Ca 10.5 Continue binders, reinforce compliance. Not on VDRA. Hard to know if ^ P due to timing of binder/missed binders or diet Needs 2 Ca 2 K bath 8.HTN, chronic/ severe/volume -Chronically elevated BP.  Try to SBP > 140 on HD. Continue home meds. BP still quite high - UF volume and wts from HD not recorded yesterday- have asked HD RN to investigate- getting prn coverage - volume emay need to come down more 9. Severe malnutrition- Alb 1.6, Renavite, Renal diet with fluid restrictions. - added nepro/prostat 10. CAD - per primary 11. Disp - CIR vs SNF- refusing return to Durene Romans, PA-C [place] [phone] 06/21/2017,9:22 AM  LOS: 10 days   Pt seen, examined and agree w A/P as above.  Kelly Splinter MD [facility] pager [phone]   06/21/2017, 1:16 PM    Subjective:   No problems with HD yesterday; wrong breakfast selections sent today  Objective Vitals:   06/20/17 1651 06/20/17 2124 06/21/17 0554 06/21/17 0827  BP: (!) 177/66 (!) 194/79 (!) 179/68 (!) 193/80  Pulse: 78 82 83 79  Resp: 18 18 20 18   Temp: 98.2 F (36.8 C) 98 F (36.7 C) 98.9 F (37.2 C) 98.8 F (37.1 C)  TempSrc: Oral Oral Oral Oral  SpO2: 99% 96% 91% 94%  Weight:      Height:        Physical Exam General: NAD pale, ill appearing with extreme muscle wasting Heart: RRR 2/6 murmur Lungs: no rales Abdomen: somewhat distended with ascites + BS soft; VAC right groin Extremities: LLE no edema right BKA wrapped;  Dialysis Access: left AVF + bruit   Additional Objective Labs: Basic Metabolic Panel: Recent Labs  Lab 06/16/17 0639  06/18/17 0612 06/19/17 0707 06/20/17 0755  NA 137   < > 135 133* 133*  K 4.5   < > 5.0 4.5 4.9  CL 95*   < > 95* 93* 92*  CO2 26   < > 25 26 24   GLUCOSE 231*   < > 94 176* 181*  BUN 40*   < > 40* 26* 48*  CREATININE 5.24*   < > 5.70* 4.07* 5.57*  CALCIUM 8.5*   < > 8.8* 8.6* 8.7*  PHOS 6.5*  --  6.9*  --  8.0*   < > = values in this interval not displayed.   Liver Function Tests: Recent Labs  Lab 06/16/17 0639 06/18/17 0612 06/20/17 0755  ALBUMIN 1.6* 1.6* 1.7*   No results for input(s): LIPASE, AMYLASE in the last 168 hours. CBC: Recent Labs  Lab 06/16/17 0639 06/17/17 0241 06/18/17 0612 06/19/17 0707 06/20/17 0754  WBC 9.9 10.9* 11.7* 11.4* 11.1*  HGB 9.1* 9.2* 8.5* 8.5* 7.7*  HCT 29.6* 30.5* 27.7* 27.6* 24.1*  MCV 90.0 91.0 89.6 90.5 89.6  PLT 271 277 265 267 260   Blood Culture    Component Value Date/Time   SDES FLUID 06/12/2017 1319   SDES FLUID PERITONEAL 06/12/2017 1319   SPECREQUEST NONE 06/12/2017 1319   SPECREQUEST BOTTLES DRAWN AEROBIC AND ANAEROBIC 06/12/2017 1319   CULT NO GROWTH 5 DAYS 06/12/2017 1319   REPTSTATUS 06/12/2017 FINAL 06/12/2017 1319   REPTSTATUS 06/17/2017 FINAL 06/12/2017 1319    Cardiac Enzymes: No results for input(s): CKTOTAL, CKMB, CKMBINDEX, TROPONINI in the last 168 hours. CBG: Recent Labs  Lab 06/19/17 1644 06/19/17 2112 06/20/17 1647 06/20/17 2119 06/21/17 0733  GLUCAP 171* 144* 131* 224* 199*   Iron Studies: No results for input(s): IRON, TIBC, TRANSFERRIN, FERRITIN in the last 72 hours. Lab Results  Component Value Date   INR 1.14 06/17/2017   INR 1.20  06/11/2017   INR 1.19 05/09/2017   Studies/Results: No results found. Medications: . sodium chloride 10 mL/hr at 06/17/17 1146  . sodium chloride    . magnesium sulfate 1 - 4 g bolus IVPB     . amLODipine  10 mg Oral QHS  . atorvastatin  40 mg Oral q1800  . Chlorhexidine Gluconate Cloth  6 each Topical Daily  . cloNIDine  0.3 mg Oral TID  . darbepoetin (ARANESP) injection - DIALYSIS  200 mcg Intravenous Q Fri-HD  . docusate  100 mg Oral TID  . fentaNYL  25 mcg Transdermal Q72H  . heparin  5,000 Units Subcutaneous Q8H  . insulin aspart  0-9 Units Subcutaneous TID WC  . insulin glargine  14 Units Subcutaneous QHS  . insulin glargine  20 Units Subcutaneous Daily  . isosorbide dinitrate  60 mg Oral BID  . labetalol  300 mg Oral TID  . metoCLOPramide  2.5 mg Oral TID AC  . multivitamin  1 tablet Oral QHS  . pantoprazole  40 mg Oral Daily  . sertraline  50 mg Oral Daily  . sevelamer carbonate  2,400 mg Oral TID WC  . sodium chloride flush  3 mL Intravenous Q12H

## 2017-06-21 NOTE — Progress Notes (Signed)
[facility] PROGRESS NOTE  LAKASHA MCFALL  WAQ:[id number] DOB: 1975/06/07 DOA: 06/11/2017 PCP: Merrilee Seashore, MD  Subjective: BKA site pain is controlled.  Objective: BP (!) 193/80 (BP Location: Right Arm)   Pulse 79   Temp 98.8 F (37.1 C) (Oral)   Resp 18   Ht 6\' 4"  (1.93 m)   Wt 76.6 kg (168 lb 14.4 oz)   LMP 10/09/2015 Comment: pt on dialysis  SpO2 94%   BMI 20.56 kg/m   Gen: Tired, thin chronically ill-appearing Pulm: Clear, nonlabored CV: RRR, no murmur, no JVD, no edema GI: Soft, distended but stable, not tender, +BS.  Neuro: Drowsy. No new deficits. Ext: LLE warm with diffuse muscle atrophy. RLE with dry dressing c/d/i on BKA site, mild flexion contracture of knee noted.   Assessment & Plan: Pt is medically stable for discharge, awaiting disposition. Hopeful for CIR over SNF.   No other changes to the plan at this time.   Vance Gather, MD [facility]

## 2017-06-22 LAB — GLUCOSE, CAPILLARY
GLUCOSE-CAPILLARY: 207 mg/dL — AB (ref 65–99)
GLUCOSE-CAPILLARY: 173 mg/dL — AB (ref 65–99)
Glucose-Capillary: 186 mg/dL — ABNORMAL HIGH (ref 65–99)
Glucose-Capillary: 158 mg/dL — ABNORMAL HIGH (ref 65–99)

## 2017-06-22 NOTE — Consult Note (Signed)
[facility] Nurse wound follow up Wound type:Stage 4 left ischial tuberosity Measurement:See WOC RN note from 06/20/17 by M. Austin  Wound bed: 100% pink per Bedside RN.  Dakin's (sodium hypochlorite solution) order expired today, so RN called to report findings and to request guidance for absorptive dressing.  Orders provided for twice daily and PRN soiling saline moistened gauze.   [facility] nursing team will follow along per previous POC with NPWT on M/W/F, and will remain available to this patient, the nursing and medical teams.   Thanks, Maudie Flakes, MSN, RN, [place], Arther Abbott  Pager# [phone]

## 2017-06-22 NOTE — Progress Notes (Signed)
[facility] Progress Note  Dialysis Orders: MWF NW 4h 33min 400/800 85.5kg 2/2.25 bath L AVF P2 Hep 2000 - Mircera 281mcg IV q 2wks - supposed to start 06/12/17  Assessment: 1.Sacral decub/osteo-Wound care, abx stopped per ID 2. Non healing R foot - s/p BKA1/22 3. Poorly healing R groin wound - wound vac placed 3. Fall/small IC bleed -MRI/cranial imaging can be done as outpatient per neurosurg 4. Ascites- s/p large vol paracentesis (9.8L) 1/17. Mildly distended abd. Per primary 5. ESRD-HDMWF - HD Monday - she has not dialyzed in a chair recently - if she is to be d/c soon, we need to try this if not accepted to CIR 6. Anemiaof CKD-Hgb 7.7, Aranesp 273mcg IV q Friday -check Fe studies Monday 7. Secondary hyperparathyroidism- . Corrected Ca 10.5 Continue binders, reinforce compliance. Not on VDRA. Hard to know if ^ P due to timing of binder/missed binders or diet Needs 2 Ca 2 K bath 8.HTN, chronic/ severe/volume -Chronically elevated BP. KeepSBP>140on HD.Continue home meds. BP still quite high - net UF 3 L Friday; no weights b/c bed scale broken - have asked nursing to get her a new bed 9. Severe malnutrition- Alb 1.7, Renavite, Renal diet with fluid restrictions- intake variable. - added nepro/prostat 10. CAD - per primary 11. Disp - CIR vs SNF- refusing return to [facility] would be optimal  Myriam Jacobson, PA-C [place] 06/22/2017,9:59 AM  LOS: 11 days   Pt seen, examined and agree w A/P as above.  Kelly Splinter MD [facility] pager [phone]   06/22/2017, 1:33 PM    Subjective:   No c/o ate - some breafkast - little eggs  Objective Vitals:   06/21/17 1700 06/21/17 2124 06/22/17 0600 06/22/17 0900  BP: (!) 185/75 (!) 189/76 (!) 176/70 (!) 167/66  Pulse: 76 78 78 78  Resp: 18 18 18 18   Temp: 98.3 F (36.8 C) 98.3 F (36.8 C) 98.4 F (36.9 C) 99.3 F (37.4 C)  TempSrc: Oral  Oral Oral Oral  SpO2: 92% 96% 94% 91%  Weight:      Height:       Physical Exam General: pale ill, NAD Heart: RRR Lungs: no rales Abdomen: distended + asctes but soft; VAC right groin Extremities: no sig edema, right BKA; marked muscle wasting Dialysis Access: left AVF + bruit   Additional Objective Labs: Basic Metabolic Panel: Recent Labs  Lab 06/16/17 0639  06/18/17 0612 06/19/17 0707 06/20/17 0755  NA 137   < > 135 133* 133*  K 4.5   < > 5.0 4.5 4.9  CL 95*   < > 95* 93* 92*  CO2 26   < > 25 26 24   GLUCOSE 231*   < > 94 176* 181*  BUN 40*   < > 40* 26* 48*  CREATININE 5.24*   < > 5.70* 4.07* 5.57*  CALCIUM 8.5*   < > 8.8* 8.6* 8.7*  PHOS 6.5*  --  6.9*  --  8.0*   < > = values in this interval not displayed.   Liver Function Tests: Recent Labs  Lab 06/16/17 0639 06/18/17 0612 06/20/17 0755  ALBUMIN 1.6* 1.6* 1.7*   No results for input(s): LIPASE, AMYLASE in the last 168 hours. CBC: Recent Labs  Lab 06/16/17 0639 06/17/17 0241 06/18/17 0612 06/19/17 0707 06/20/17 0754  WBC 9.9 10.9* 11.7* 11.4* 11.1*  HGB 9.1* 9.2* 8.5* 8.5* 7.7*  HCT 29.6* 30.5* 27.7* 27.6* 24.1*  MCV  90.0 91.0 89.6 90.5 89.6  PLT 271 277 265 267 260   Blood Culture    Component Value Date/Time   SDES FLUID 06/12/2017 1319   SDES FLUID PERITONEAL 06/12/2017 1319   SPECREQUEST NONE 06/12/2017 1319   SPECREQUEST BOTTLES DRAWN AEROBIC AND ANAEROBIC 06/12/2017 1319   CULT NO GROWTH 5 DAYS 06/12/2017 1319   REPTSTATUS 06/12/2017 FINAL 06/12/2017 1319   REPTSTATUS 06/17/2017 FINAL 06/12/2017 1319    Cardiac Enzymes: No results for input(s): CKTOTAL, CKMB, CKMBINDEX, TROPONINI in the last 168 hours. CBG: Recent Labs  Lab 06/21/17 0733 06/21/17 1212 06/21/17 1630 06/21/17 2131 06/22/17 0803  GLUCAP 199* 198* 157* 175* 207*   Iron Studies: No results for input(s): IRON, TIBC, TRANSFERRIN, FERRITIN in the last 72 hours. Lab Results  Component Value Date   INR 1.14  06/17/2017   INR 1.20 06/11/2017   INR 1.19 05/09/2017   Studies/Results: No results found. Medications: . sodium chloride 10 mL/hr at 06/17/17 1146  . sodium chloride     . amLODipine  10 mg Oral QHS  . atorvastatin  40 mg Oral q1800  . cloNIDine  0.3 mg Oral TID  . darbepoetin (ARANESP) injection - DIALYSIS  200 mcg Intravenous Q Fri-HD  . docusate  100 mg Oral TID  . feeding supplement (NEPRO CARB STEADY)  237 mL Oral BID BM  . feeding supplement (PRO-STAT SUGAR FREE 64)  30 mL Oral BID  . fentaNYL  25 mcg Transdermal Q72H  . heparin  5,000 Units Subcutaneous Q8H  . insulin aspart  0-9 Units Subcutaneous TID WC  . insulin glargine  14 Units Subcutaneous QHS  . insulin glargine  20 Units Subcutaneous Daily  . isosorbide dinitrate  60 mg Oral BID  . labetalol  300 mg Oral TID  . metoCLOPramide  2.5 mg Oral TID AC  . multivitamin  1 tablet Oral QHS  . pantoprazole  40 mg Oral Daily  . sertraline  50 mg Oral Daily  . sevelamer carbonate  2,400 mg Oral TID WC  . sodium chloride flush  3 mL Intravenous Q12H

## 2017-06-22 NOTE — Progress Notes (Signed)
[facility] PROGRESS NOTE  [name]  RSW:[id number] DOB: 07-09-1975 DOA: 06/11/2017  Subjective: Pain control improved. No new complaints.   Objective: BP (!) 167/66 (BP Location: Right Arm)   Pulse 78   Temp 99.3 F (37.4 C) (Oral)   Resp 18   Ht 6\' 4"  (1.93 m)   Wt 76.6 kg (168 lb 14.4 oz)   LMP 10/09/2015 Comment: pt on dialysis  SpO2 91%   BMI 20.56 kg/m   Gen: Tired, thin chronically ill-appearing Pulm: Clear, nonlabored CV: RRR, no murmur, no JVD, no edema GI: Soft, distended but stable, not tender, +BS.  Neuro: Drowsy. No new deficits. Ext: LLE warm with diffuse muscle atrophy. RLE with dry dressing c/d/i on BKA site, mild flexion contracture of knee noted.   Assessment & Plan: Pt is medically stable for discharge, awaiting disposition. Hopeful for CIR over SNF.   No other changes to the plan at this time.  Vance Gather, MD [facility]

## 2017-06-23 ENCOUNTER — Encounter (HOSPITAL_COMMUNITY): Payer: Self-pay | Admitting: *Deleted

## 2017-06-23 ENCOUNTER — Inpatient Hospital Stay (HOSPITAL_COMMUNITY)
Admission: RE | Admit: 2017-06-23 | Discharge: 2017-07-10 | DRG: 559 | Disposition: A | Discharge status: Home Health | Payer: Medicare Other | Source: Intra-hospital | Attending: Physical Medicine & Rehabilitation | Admitting: Physical Medicine & Rehabilitation

## 2017-06-23 ENCOUNTER — Ambulatory Visit: Payer: Medicare Other | Admitting: Surgery

## 2017-06-23 DIAGNOSIS — Z5329 Procedure and treatment not carried out because of patient's decision for other reasons: Secondary | ICD-10-CM | POA: Diagnosis not present

## 2017-06-23 DIAGNOSIS — D62 Acute posthemorrhagic anemia: Secondary | ICD-10-CM | POA: Diagnosis not present

## 2017-06-23 DIAGNOSIS — Z992 Dependence on renal dialysis: Secondary | ICD-10-CM | POA: Diagnosis not present

## 2017-06-23 DIAGNOSIS — G546 Phantom limb syndrome with pain: Secondary | ICD-10-CM

## 2017-06-23 DIAGNOSIS — D638 Anemia in other chronic diseases classified elsewhere: Secondary | ICD-10-CM | POA: Diagnosis not present

## 2017-06-23 DIAGNOSIS — S88111A Complete traumatic amputation at level between knee and ankle, right lower leg, initial encounter: Secondary | ICD-10-CM | POA: Diagnosis present

## 2017-06-23 DIAGNOSIS — M8628 Subacute osteomyelitis, other site: Secondary | ICD-10-CM | POA: Diagnosis not present

## 2017-06-23 DIAGNOSIS — E781 Pure hyperglyceridemia: Secondary | ICD-10-CM | POA: Diagnosis present

## 2017-06-23 DIAGNOSIS — R21 Rash and other nonspecific skin eruption: Secondary | ICD-10-CM | POA: Diagnosis not present

## 2017-06-23 DIAGNOSIS — N2581 Secondary hyperparathyroidism of renal origin: Secondary | ICD-10-CM | POA: Diagnosis present

## 2017-06-23 DIAGNOSIS — G4700 Insomnia, unspecified: Secondary | ICD-10-CM | POA: Diagnosis present

## 2017-06-23 DIAGNOSIS — E785 Hyperlipidemia, unspecified: Secondary | ICD-10-CM | POA: Diagnosis present

## 2017-06-23 DIAGNOSIS — I10 Essential (primary) hypertension: Secondary | ICD-10-CM

## 2017-06-23 DIAGNOSIS — K3184 Gastroparesis: Secondary | ICD-10-CM | POA: Diagnosis present

## 2017-06-23 DIAGNOSIS — Z883 Allergy status to other anti-infective agents status: Secondary | ICD-10-CM

## 2017-06-23 DIAGNOSIS — Z961 Presence of intraocular lens: Secondary | ICD-10-CM | POA: Diagnosis present

## 2017-06-23 DIAGNOSIS — E1169 Type 2 diabetes mellitus with other specified complication: Secondary | ICD-10-CM | POA: Diagnosis present

## 2017-06-23 DIAGNOSIS — E11649 Type 2 diabetes mellitus with hypoglycemia without coma: Secondary | ICD-10-CM | POA: Diagnosis not present

## 2017-06-23 DIAGNOSIS — Z87891 Personal history of nicotine dependence: Secondary | ICD-10-CM

## 2017-06-23 DIAGNOSIS — G894 Chronic pain syndrome: Secondary | ICD-10-CM

## 2017-06-23 DIAGNOSIS — L89154 Pressure ulcer of sacral region, stage 4: Secondary | ICD-10-CM | POA: Diagnosis present

## 2017-06-23 DIAGNOSIS — I132 Hypertensive heart and chronic kidney disease with heart failure and with stage 5 chronic kidney disease, or end stage renal disease: Secondary | ICD-10-CM | POA: Diagnosis present

## 2017-06-23 DIAGNOSIS — D631 Anemia in chronic kidney disease: Secondary | ICD-10-CM | POA: Diagnosis present

## 2017-06-23 DIAGNOSIS — I5033 Acute on chronic diastolic (congestive) heart failure: Secondary | ICD-10-CM

## 2017-06-23 DIAGNOSIS — I509 Heart failure, unspecified: Secondary | ICD-10-CM | POA: Diagnosis not present

## 2017-06-23 DIAGNOSIS — E1151 Type 2 diabetes mellitus with diabetic peripheral angiopathy without gangrene: Secondary | ICD-10-CM | POA: Diagnosis present

## 2017-06-23 DIAGNOSIS — M869 Osteomyelitis, unspecified: Secondary | ICD-10-CM | POA: Diagnosis not present

## 2017-06-23 DIAGNOSIS — E162 Hypoglycemia, unspecified: Secondary | ICD-10-CM | POA: Diagnosis not present

## 2017-06-23 DIAGNOSIS — I2510 Atherosclerotic heart disease of native coronary artery without angina pectoris: Secondary | ICD-10-CM

## 2017-06-23 DIAGNOSIS — Z1401 Asymptomatic hemophilia A carrier: Secondary | ICD-10-CM

## 2017-06-23 DIAGNOSIS — Z681 Body mass index (BMI) 19 or less, adult: Secondary | ICD-10-CM

## 2017-06-23 DIAGNOSIS — G8918 Other acute postprocedural pain: Secondary | ICD-10-CM | POA: Diagnosis not present

## 2017-06-23 DIAGNOSIS — Z8674 Personal history of sudden cardiac arrest: Secondary | ICD-10-CM

## 2017-06-23 DIAGNOSIS — D72829 Elevated white blood cell count, unspecified: Secondary | ICD-10-CM | POA: Diagnosis not present

## 2017-06-23 DIAGNOSIS — S88111S Complete traumatic amputation at level between knee and ankle, right lower leg, sequela: Secondary | ICD-10-CM | POA: Diagnosis not present

## 2017-06-23 DIAGNOSIS — R52 Pain, unspecified: Secondary | ICD-10-CM | POA: Diagnosis not present

## 2017-06-23 DIAGNOSIS — E119 Type 2 diabetes mellitus without complications: Secondary | ICD-10-CM

## 2017-06-23 DIAGNOSIS — Z794 Long term (current) use of insulin: Secondary | ICD-10-CM

## 2017-06-23 DIAGNOSIS — E43 Unspecified severe protein-calorie malnutrition: Secondary | ICD-10-CM | POA: Diagnosis present

## 2017-06-23 DIAGNOSIS — Z7982 Long term (current) use of aspirin: Secondary | ICD-10-CM

## 2017-06-23 DIAGNOSIS — E1143 Type 2 diabetes mellitus with diabetic autonomic (poly)neuropathy: Secondary | ICD-10-CM | POA: Diagnosis present

## 2017-06-23 DIAGNOSIS — N186 End stage renal disease: Secondary | ICD-10-CM | POA: Diagnosis present

## 2017-06-23 DIAGNOSIS — R188 Other ascites: Secondary | ICD-10-CM

## 2017-06-23 DIAGNOSIS — M4628 Osteomyelitis of vertebra, sacral and sacrococcygeal region: Secondary | ICD-10-CM | POA: Diagnosis present

## 2017-06-23 DIAGNOSIS — I169 Hypertensive crisis, unspecified: Secondary | ICD-10-CM

## 2017-06-23 DIAGNOSIS — L89890 Pressure ulcer of other site, unstageable: Secondary | ICD-10-CM | POA: Diagnosis not present

## 2017-06-23 DIAGNOSIS — Z89511 Acquired absence of right leg below knee: Secondary | ICD-10-CM

## 2017-06-23 DIAGNOSIS — X58XXXA Exposure to other specified factors, initial encounter: Secondary | ICD-10-CM | POA: Diagnosis present

## 2017-06-23 DIAGNOSIS — E1142 Type 2 diabetes mellitus with diabetic polyneuropathy: Secondary | ICD-10-CM | POA: Diagnosis present

## 2017-06-23 DIAGNOSIS — S31109A Unspecified open wound of abdominal wall, unspecified quadrant without penetration into peritoneal cavity, initial encounter: Secondary | ICD-10-CM | POA: Diagnosis present

## 2017-06-23 DIAGNOSIS — Z833 Family history of diabetes mellitus: Secondary | ICD-10-CM

## 2017-06-23 DIAGNOSIS — Z9111 Patient's noncompliance with dietary regimen: Secondary | ICD-10-CM

## 2017-06-23 DIAGNOSIS — L299 Pruritus, unspecified: Secondary | ICD-10-CM | POA: Diagnosis not present

## 2017-06-23 DIAGNOSIS — R29810 Facial weakness: Secondary | ICD-10-CM | POA: Diagnosis not present

## 2017-06-23 DIAGNOSIS — Z79899 Other long term (current) drug therapy: Secondary | ICD-10-CM

## 2017-06-23 DIAGNOSIS — E1122 Type 2 diabetes mellitus with diabetic chronic kidney disease: Secondary | ICD-10-CM | POA: Diagnosis present

## 2017-06-23 DIAGNOSIS — Z4781 Encounter for orthopedic aftercare following surgical amputation: Principal | ICD-10-CM

## 2017-06-23 DIAGNOSIS — Z86001 Personal history of in-situ neoplasm of cervix uteri: Secondary | ICD-10-CM

## 2017-06-23 DIAGNOSIS — I739 Peripheral vascular disease, unspecified: Secondary | ICD-10-CM

## 2017-06-23 DIAGNOSIS — K7011 Alcoholic hepatitis with ascites: Secondary | ICD-10-CM | POA: Diagnosis not present

## 2017-06-23 DIAGNOSIS — D72828 Other elevated white blood cell count: Secondary | ICD-10-CM | POA: Diagnosis not present

## 2017-06-23 DIAGNOSIS — Z881 Allergy status to other antibiotic agents status: Secondary | ICD-10-CM

## 2017-06-23 DIAGNOSIS — E11610 Type 2 diabetes mellitus with diabetic neuropathic arthropathy: Secondary | ICD-10-CM | POA: Diagnosis present

## 2017-06-23 DIAGNOSIS — E46 Unspecified protein-calorie malnutrition: Secondary | ICD-10-CM | POA: Diagnosis not present

## 2017-06-23 DIAGNOSIS — Z8249 Family history of ischemic heart disease and other diseases of the circulatory system: Secondary | ICD-10-CM

## 2017-06-23 DIAGNOSIS — G939 Disorder of brain, unspecified: Secondary | ICD-10-CM

## 2017-06-23 DIAGNOSIS — R7309 Other abnormal glucose: Secondary | ICD-10-CM | POA: Diagnosis not present

## 2017-06-23 LAB — RENAL FUNCTION PANEL
Albumin: 1.6 g/dL — ABNORMAL LOW (ref 3.5–5.0)
Anion gap: 17 — ABNORMAL HIGH (ref 5–15)
BUN: 53 mg/dL — AB (ref 6–20)
CO2: 25 mmol/L (ref 22–32)
CREATININE: 6.2 mg/dL — AB (ref 0.44–1.00)
Calcium: 8.6 mg/dL — ABNORMAL LOW (ref 8.9–10.3)
Chloride: 92 mmol/L — ABNORMAL LOW (ref 101–111)
GFR calc Af Amer: 9 mL/min — ABNORMAL LOW (ref >60)
GFR calc non Af Amer: 8 mL/min — ABNORMAL LOW (ref >60)
GLUCOSE: 94 mg/dL (ref 65–99)
PHOSPHORUS: 7.4 mg/dL — AB (ref 2.5–4.6)
POTASSIUM: 4.9 mmol/L (ref 3.5–5.1)
Sodium: 134 mmol/L — ABNORMAL LOW (ref 135–145)

## 2017-06-23 LAB — GLUCOSE, CAPILLARY
GLUCOSE-CAPILLARY: 89 mg/dL (ref 65–99)
Glucose-Capillary: 89 mg/dL (ref 65–99)
Glucose-Capillary: 125 mg/dL — ABNORMAL HIGH (ref 65–99)

## 2017-06-23 LAB — IRON AND TIBC
Iron: 26 ug/dL — ABNORMAL LOW (ref 28–170)
SATURATION RATIOS: 23 % (ref 10.4–31.8)
TIBC: 113 ug/dL — ABNORMAL LOW (ref 250–450)
UIBC: 87 ug/dL

## 2017-06-23 LAB — CBC
HCT: 25.1 % — ABNORMAL LOW (ref 36.0–46.0)
Hemoglobin: 7.7 g/dL — ABNORMAL LOW (ref 12.0–15.0)
MCH: 27.8 pg (ref 26.0–34.0)
MCHC: 30.7 g/dL (ref 30.0–36.0)
MCV: 90.6 fL (ref 78.0–100.0)
PLATELETS: 267 10*3/uL (ref 150–400)
RBC: 2.77 MIL/uL — ABNORMAL LOW (ref 3.87–5.11)
RDW: 17.3 % — AB (ref 11.5–15.5)
WBC: 9.9 10*3/uL (ref 4.0–10.5)

## 2017-06-23 LAB — FERRITIN: FERRITIN: 1050 ng/mL — AB (ref 11–307)

## 2017-06-23 MED ORDER — PANTOPRAZOLE SODIUM 40 MG PO TBEC
40.0000 mg | DELAYED_RELEASE_TABLET | Freq: Every day | ORAL | Status: DC
  Administered 2017-06-24 – 2017-07-10 (×17): 40 mg via ORAL
  Filled 2017-06-23 (×17): qty 1

## 2017-06-23 MED ORDER — ACETAMINOPHEN 325 MG PO TABS
325.0000 mg | ORAL_TABLET | ORAL | Status: DC | PRN
  Administered 2017-06-24 – 2017-07-04 (×5): 650 mg via ORAL
  Filled 2017-06-23 (×4): qty 2

## 2017-06-23 MED ORDER — ALTEPLASE 2 MG IJ SOLR: 2.0000 mg | Freq: Once | INTRAMUSCULAR | Status: DC | PRN

## 2017-06-23 MED ORDER — HEPARIN SODIUM (PORCINE) 1000 UNIT/ML DIALYSIS: 1000.0000 [IU] | INTRAMUSCULAR | Status: DC | PRN

## 2017-06-23 MED ORDER — PROCHLORPERAZINE MALEATE 5 MG PO TABS: 5.0000 mg | ORAL_TABLET | Freq: Four times a day (QID) | ORAL | Status: DC | PRN

## 2017-06-23 MED ORDER — HEPARIN SODIUM (PORCINE) 5000 UNIT/ML IJ SOLN
5000.0000 [IU] | Freq: Three times a day (TID) | INTRAMUSCULAR | Status: DC
  Administered 2017-06-24 – 2017-07-09 (×17): 5000 [IU] via SUBCUTANEOUS
  Filled 2017-06-23 (×31): qty 1

## 2017-06-23 MED ORDER — METOCLOPRAMIDE HCL 5 MG PO TABS
2.5000 mg | ORAL_TABLET | Freq: Three times a day (TID) | ORAL | Status: DC
  Administered 2017-06-23 – 2017-07-10 (×45): 2.5 mg via ORAL
  Filled 2017-06-23 (×47): qty 1

## 2017-06-23 MED ORDER — CINACALCET HCL 30 MG PO TABS
60.0000 mg | ORAL_TABLET | Freq: Every day | ORAL | Status: DC
  Administered 2017-06-23 – 2017-07-09 (×14): 60 mg via ORAL
  Filled 2017-06-23 (×14): qty 2

## 2017-06-23 MED ORDER — SERTRALINE HCL 50 MG PO TABS
50.0000 mg | ORAL_TABLET | Freq: Every day | ORAL | Status: DC
  Administered 2017-06-24 – 2017-07-10 (×16): 50 mg via ORAL
  Filled 2017-06-23 (×17): qty 1

## 2017-06-23 MED ORDER — FERRIC CITRATE 1 GM 210 MG(FE) PO TABS
420.0000 mg | ORAL_TABLET | Freq: Three times a day (TID) | ORAL | Status: DC
  Administered 2017-06-23: 420 mg via ORAL
  Filled 2017-06-23 (×2): qty 2

## 2017-06-23 MED ORDER — LABETALOL HCL 200 MG PO TABS
300.0000 mg | ORAL_TABLET | Freq: Three times a day (TID) | ORAL | Status: DC
  Administered 2017-06-23 – 2017-07-10 (×44): 300 mg via ORAL
  Filled 2017-06-23 (×45): qty 1

## 2017-06-23 MED ORDER — NEPRO/CARBSTEADY PO LIQD: 237.0000 mL | Freq: Two times a day (BID) | ORAL | 0 refills | Status: DC

## 2017-06-23 MED ORDER — POLYETHYLENE GLYCOL 3350 17 G PO PACK: 17.0000 g | PACK | Freq: Every day | ORAL | Status: DC | PRN

## 2017-06-23 MED ORDER — OXYCODONE HCL 5 MG PO TABS
5.0000 mg | ORAL_TABLET | ORAL | Status: DC | PRN
  Administered 2017-06-23 – 2017-06-24 (×5): 10 mg via ORAL
  Administered 2017-06-25: 5 mg via ORAL
  Administered 2017-06-25 – 2017-06-30 (×16): 10 mg via ORAL
  Administered 2017-06-30: 5 mg via ORAL
  Administered 2017-06-30 – 2017-07-10 (×18): 10 mg via ORAL
  Filled 2017-06-23 (×16): qty 2
  Filled 2017-06-23: qty 1
  Filled 2017-06-23 (×11): qty 2
  Filled 2017-06-23: qty 1
  Filled 2017-06-23 (×13): qty 2

## 2017-06-23 MED ORDER — DIPHENHYDRAMINE HCL 12.5 MG/5ML PO ELIX: 12.5000 mg | ORAL_SOLUTION | Freq: Four times a day (QID) | ORAL | Status: DC | PRN

## 2017-06-23 MED ORDER — INSULIN GLARGINE 100 UNIT/ML ~~LOC~~ SOLN
14.0000 [IU] | Freq: Every day | SUBCUTANEOUS | Status: DC
  Administered 2017-06-23 – 2017-06-26 (×3): 14 [IU] via SUBCUTANEOUS
  Filled 2017-06-23 (×4): qty 0.14

## 2017-06-23 MED ORDER — RESOURCE INSTANT PROTEIN PO PWD PACKET: 1.0000 | Freq: Three times a day (TID) | ORAL | Status: DC

## 2017-06-23 MED ORDER — INSULIN ASPART 100 UNIT/ML ~~LOC~~ SOLN
0.0000 [IU] | Freq: Three times a day (TID) | SUBCUTANEOUS | Status: DC
  Administered 2017-06-25: 2 [IU] via SUBCUTANEOUS
  Administered 2017-06-25: 5 [IU] via SUBCUTANEOUS
  Administered 2017-06-26: 1 [IU] via SUBCUTANEOUS
  Administered 2017-06-26: 3 [IU] via SUBCUTANEOUS
  Administered 2017-06-26: 2 [IU] via SUBCUTANEOUS
  Administered 2017-06-27 – 2017-06-28 (×2): 3 [IU] via SUBCUTANEOUS
  Administered 2017-06-29 (×2): 2 [IU] via SUBCUTANEOUS
  Administered 2017-07-01: 3 [IU] via SUBCUTANEOUS
  Administered 2017-07-01: 1 [IU] via SUBCUTANEOUS
  Administered 2017-07-03: 2 [IU] via SUBCUTANEOUS
  Administered 2017-07-03: 3 [IU] via SUBCUTANEOUS
  Administered 2017-07-05: 2 [IU] via SUBCUTANEOUS
  Administered 2017-07-05: 1 [IU] via SUBCUTANEOUS
  Administered 2017-07-05: 3 [IU] via SUBCUTANEOUS
  Administered 2017-07-06 – 2017-07-08 (×3): 2 [IU] via SUBCUTANEOUS

## 2017-06-23 MED ORDER — INSULIN GLARGINE 100 UNIT/ML ~~LOC~~ SOLN
20.0000 [IU] | Freq: Every day | SUBCUTANEOUS | Status: DC
  Administered 2017-06-24 – 2017-07-10 (×15): 20 [IU] via SUBCUTANEOUS
  Filled 2017-06-23 (×17): qty 0.2

## 2017-06-23 MED ORDER — GUAIFENESIN-DM 100-10 MG/5ML PO SYRP: 5.0000 mL | ORAL_SOLUTION | Freq: Four times a day (QID) | ORAL | Status: DC | PRN

## 2017-06-23 MED ORDER — CINACALCET HCL 30 MG PO TABS: 60.0000 mg | ORAL_TABLET | Freq: Every day | ORAL | Status: DC

## 2017-06-23 MED ORDER — GABAPENTIN 100 MG PO CAPS
100.0000 mg | ORAL_CAPSULE | Freq: Every day | ORAL | Status: DC
  Administered 2017-06-23 – 2017-07-01 (×9): 100 mg via ORAL
  Filled 2017-06-23 (×9): qty 1

## 2017-06-23 MED ORDER — CLONIDINE HCL 0.2 MG PO TABS
0.3000 mg | ORAL_TABLET | Freq: Three times a day (TID) | ORAL | Status: DC
  Administered 2017-06-23 – 2017-07-10 (×44): 0.3 mg via ORAL
  Filled 2017-06-23 (×48): qty 1

## 2017-06-23 MED ORDER — SEVELAMER CARBONATE 800 MG PO TABS
2400.0000 mg | ORAL_TABLET | Freq: Three times a day (TID) | ORAL | Status: DC
  Administered 2017-06-23 – 2017-07-10 (×43): 2400 mg via ORAL
  Filled 2017-06-23 (×49): qty 3

## 2017-06-23 MED ORDER — RENA-VITE PO TABS
1.0000 | ORAL_TABLET | Freq: Every day | ORAL | Status: DC
  Administered 2017-06-23 – 2017-07-09 (×16): 1 via ORAL
  Filled 2017-06-23 (×18): qty 1

## 2017-06-23 MED ORDER — PENTAFLUOROPROP-TETRAFLUOROETH EX AERO: 1.0000 "application " | INHALATION_SPRAY | CUTANEOUS | Status: DC | PRN

## 2017-06-23 MED ORDER — PROCHLORPERAZINE EDISYLATE 5 MG/ML IJ SOLN: 5.0000 mg | Freq: Four times a day (QID) | INTRAMUSCULAR | Status: DC | PRN

## 2017-06-23 MED ORDER — LORAZEPAM 0.5 MG PO TABS
1.0000 mg | ORAL_TABLET | Freq: Four times a day (QID) | ORAL | Status: DC | PRN
  Administered 2017-06-23 – 2017-06-25 (×3): 1 mg via ORAL
  Filled 2017-06-23 (×4): qty 2

## 2017-06-23 MED ORDER — DOCUSATE SODIUM 50 MG/5ML PO LIQD
100.0000 mg | Freq: Three times a day (TID) | ORAL | Status: DC
  Administered 2017-06-23 – 2017-07-03 (×15): 100 mg via ORAL
  Filled 2017-06-23 (×22): qty 10

## 2017-06-23 MED ORDER — FENTANYL 12 MCG/HR TD PT72
25.0000 ug | MEDICATED_PATCH | TRANSDERMAL | Status: DC
  Administered 2017-06-23 – 2017-07-08 (×6): 25 ug via TRANSDERMAL
  Filled 2017-06-23 (×9): qty 2

## 2017-06-23 MED ORDER — LIDOCAINE HCL (PF) 1 % IJ SOLN: 5.0000 mL | INTRAMUSCULAR | Status: DC | PRN

## 2017-06-23 MED ORDER — DARBEPOETIN ALFA 200 MCG/0.4ML IJ SOSY
200.0000 ug | PREFILLED_SYRINGE | INTRAMUSCULAR | Status: DC
  Administered 2017-06-27 – 2017-07-04 (×2): 200 ug via INTRAVENOUS
  Filled 2017-06-23 (×2): qty 0.4

## 2017-06-23 MED ORDER — ATORVASTATIN CALCIUM 40 MG PO TABS
40.0000 mg | ORAL_TABLET | Freq: Every day | ORAL | Status: DC
  Administered 2017-06-23 – 2017-07-09 (×15): 40 mg via ORAL
  Filled 2017-06-23 (×15): qty 1

## 2017-06-23 MED ORDER — SODIUM CHLORIDE 0.9 % IV SOLN: 100.0000 mL | INTRAVENOUS | Status: DC | PRN

## 2017-06-23 MED ORDER — ALUMINUM HYDROXIDE GEL 320 MG/5ML PO SUSP
10.0000 mL | ORAL | Status: DC | PRN
Start: 2017-06-23 — End: 2017-07-10
  Filled 2017-06-23: qty 30

## 2017-06-23 MED ORDER — AMLODIPINE BESYLATE 10 MG PO TABS
10.0000 mg | ORAL_TABLET | Freq: Every day | ORAL | Status: DC
  Administered 2017-06-23 – 2017-07-09 (×17): 10 mg via ORAL
  Filled 2017-06-23 (×17): qty 1

## 2017-06-23 MED ORDER — NEPRO/CARBSTEADY PO LIQD
237.0000 mL | Freq: Three times a day (TID) | ORAL | Status: DC
  Administered 2017-06-23 – 2017-06-27 (×11): 237 mL via ORAL

## 2017-06-23 MED ORDER — LIDOCAINE-PRILOCAINE 2.5-2.5 % EX CREA: 1.0000 "application " | TOPICAL_CREAM | CUTANEOUS | Status: DC | PRN

## 2017-06-23 MED ORDER — TRAMADOL HCL 50 MG PO TABS
50.0000 mg | ORAL_TABLET | Freq: Four times a day (QID) | ORAL | Status: DC | PRN
  Administered 2017-06-23 – 2017-07-09 (×19): 50 mg via ORAL
  Filled 2017-06-23 (×21): qty 1

## 2017-06-23 MED ORDER — TRAZODONE HCL 50 MG PO TABS
25.0000 mg | ORAL_TABLET | Freq: Every evening | ORAL | Status: DC | PRN
  Administered 2017-06-23 – 2017-07-09 (×13): 50 mg via ORAL
  Filled 2017-06-23 (×13): qty 1

## 2017-06-23 MED ORDER — HYDROMORPHONE HCL 1 MG/ML IJ SOLN
INTRAMUSCULAR | Status: AC
  Administered 2017-06-23: 1 mg via INTRAVENOUS
  Filled 2017-06-23: qty 1

## 2017-06-23 MED ORDER — ISOSORBIDE DINITRATE 30 MG PO TABS
60.0000 mg | ORAL_TABLET | Freq: Two times a day (BID) | ORAL | Status: DC
  Administered 2017-06-23 – 2017-07-10 (×33): 60 mg via ORAL
  Filled 2017-06-23 (×34): qty 2

## 2017-06-23 MED ORDER — BENEPROTEIN PO POWD
1.0000 | Freq: Three times a day (TID) | ORAL | Status: DC
  Administered 2017-06-23 – 2017-06-30 (×8): 6 g via ORAL
  Filled 2017-06-23 (×2): qty 227

## 2017-06-23 MED ORDER — FERRIC CITRATE 1 GM 210 MG(FE) PO TABS: 420.0000 mg | ORAL_TABLET | Freq: Three times a day (TID) | ORAL | Status: DC

## 2017-06-23 MED ORDER — BISACODYL 10 MG RE SUPP
10.0000 mg | Freq: Every day | RECTAL | Status: DC | PRN
  Filled 2017-06-23: qty 1

## 2017-06-23 MED ORDER — FERRIC CITRATE 1 GM 210 MG(FE) PO TABS
420.0000 mg | ORAL_TABLET | Freq: Three times a day (TID) | ORAL | Status: DC
  Administered 2017-06-23 – 2017-07-10 (×45): 420 mg via ORAL
  Filled 2017-06-23 (×56): qty 2

## 2017-06-23 MED ORDER — PHENOL 1.4 % MT LIQD: 1.0000 | OROMUCOSAL | Status: DC | PRN

## 2017-06-23 MED ORDER — HEPARIN SODIUM (PORCINE) 1000 UNIT/ML DIALYSIS: 20.0000 [IU]/kg | INTRAMUSCULAR | Status: DC | PRN

## 2017-06-23 MED ORDER — ACETAMINOPHEN 325 MG PO TABS
ORAL_TABLET | ORAL | Status: AC
  Filled 2017-06-23: qty 2

## 2017-06-23 MED ORDER — PROCHLORPERAZINE 25 MG RE SUPP: 12.5000 mg | Freq: Four times a day (QID) | RECTAL | Status: DC | PRN

## 2017-06-23 NOTE — IPOC Note (Signed)
Overall Plan of Care [facility]) Patient Details Name: [name] MRN: [id number] DOB: Oct 09, 1975  Admitting Diagnosis: Right BKA and multi-medical  Hospital Problems: Active Problems:   Unilateral complete BKA, right, initial encounter ([place])   Acute blood loss anemia   Anemia of chronic disease   ESRD on dialysis ([place])   Benign essential HTN   Diabetes mellitus type 2 in nonobese ([place])     Functional Problem List: Nursing Nutrition, Endurance, Medication Management, Skin Integrity, Safety  PT Balance, Endurance, Motor, Pain, Sensory, Skin Integrity  OT Balance, Perception, Cognition, Edema, Endurance, Motor, Nutrition, Pain, Safety, Sensory, Skin Integrity  SLP Cognition  TR Edema, Endurance, Motor, Pain, Skin Integrity       Basic ADL's: OT Grooming, Bathing, Dressing, Toileting     Advanced  ADL's: OT       Transfers: PT Furniture, Car, Bed to Chair, [employer]: PT Ambulation, Wheelchair Mobility     Additional Impairments: OT None  SLP Social Cognition   Problem Solving, Memory  TR      Anticipated Outcomes Item Anticipated Outcome  Self Feeding n/a  Swallowing      Basic self-care  min A   Toileting  min A   Bathroom Transfers min A   Bowel/Bladder     Transfers  min A  Locomotion  supervision w/c level  Communication     Cognition  mod I   Pain  pain managed at or below level 5 with prn medications  Safety/Judgment      Therapy Plan: PT Intensity: Minimum of 1-2 x/day ,45 to 90 minutes PT Frequency: Total of 15 hours over 7 days of combined therapies PT Duration Estimated Length of Stay: 24-28 days OT Intensity: Minimum of 1-2 x/day, 45 to 90 minutes OT Frequency: 5 out of 7 days OT Duration/Estimated Length of Stay: ~21-25 days SLP Intensity: Minumum of 1-2 x/day, 30 to 90 minutes SLP Frequency: 1 to 3 out of 7 days SLP Duration/Estimated Length of Stay: 10-14 days for SLP     Team  Interventions: Nursing Interventions Patient/Family Education, Skin Care/Wound Management, Disease Management/Prevention, Medication Management, Discharge Planning  PT interventions Ambulation/gait training, Community reintegration, DME/adaptive equipment instruction, IT trainer, Neuromuscular re-education, UE/LE Strength taining/ROM, Wheelchair propulsion/positioning, UE/LE Coordination activities, Therapeutic Activities, Pain management, Discharge planning, Training and development officer, Cognitive remediation/compensation, Functional mobility training, Patient/family education, Splinting/orthotics, Therapeutic Exercise  OT Interventions Balance/vestibular training, Discharge planning, Pain management, Self Care/advanced ADL retraining, Therapeutic Activities, UE/LE Coordination activities, Cognitive remediation/compensation, Disease mangement/prevention, Functional mobility training, Patient/family education, Skin care/wound managment, Therapeutic Exercise, UE/LE Strength taining/ROM, Splinting/orthotics, Psychosocial support, Neuromuscular re-education, DME/adaptive equipment instruction, Community reintegration  SLP Interventions Cognitive remediation/compensation, English as a second language teacher, Functional tasks, Patient/family education, Internal/external aids, Environmental controls  TR Interventions    SW/CM Interventions Discharge Planning, Psychosocial Support, Patient/Family Education   Barriers to Discharge MD  Medical stability, Hemodialysis and Weight bearing restrictions  Nursing      PT Medical stability, Wound Care, Hemodialysis, Lack of/limited family support    OT Wound Care    SLP      SW       Team Discharge Planning: Destination: PT-Home ,OT- Home , SLP-Home Projected Follow-up: PT-Home health PT, 24 hour supervision/assistance, OT-  Home health OT, 24 hour supervision/assistance, SLP-None Projected Equipment Needs: PT-To be determined, OT- To be determined, SLP-None recommended by  SLP Equipment Details: PT- , OT-  Patient/family involved in discharge planning: PT- Patient,  OT-Patient, SLP-Patient  MD  ELOS: 16-19 days. Medical Rehab Prognosis:  Good Assessment:  [name] is a 42 y.o. female with histoyr of CAD, ESRD, T2DM, right foot wounds s/p revascularization complicated hospital course 03/2017, sacral decub and RLE ischemia requiring right metatarsal amputation with exploration of right groin wound 04/2017. She was discharged to SNF 1/16 and readmitted later that evening due to fall out of bed.  CT head revealed hyperdense focus in left posterior parietal region suspicious for small hemorrhagic mass, reviewed. Dr. Christella Noa recommended follow up MRI but she was unable to lie flat for MRI and can be done on outpatient basis per NS.  She was found to have ascites due to fluid overload and underwent paracentesis of 9.8 L fluid. CT abdomen/pelvis done due to stage IV sacral decub and wound drainage and was positive for sacral osteomyelitis. She was started on  IV Vanc and Zosyn--ID following for input.  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 Trula Slade. She continues to have VAC to right groin. Antibiotics discontinued with recommendations to monitor wound over next week as well as pressure relief measures. She is having difficulty sitting for HD due to sacral pain with question of ability to tolerate outpatient HD. Pt with resulting functional deficits with mobility, transfers, self-care.  Will set goals for Min A with PT/OT and Mod I with SLP.   See Team Conference Notes for weekly updates to the plan of care

## 2017-06-23 NOTE — PMR Pre-admission (Signed)
PMR Admission Coordinator Pre-Admission Assessment  Patient: [name] is an 42 y.o., female MRN: [id number] DOB: 1975/05/30 Height: 6\' 4"  (193 cm) Weight: 76.8 kg (169 lb 6.4 oz)            Insurance Information HMO:  No    PPO:       PCP:       IPA:       80/20:       OTHER:   PRIMARY:  Medicare A and B      Policy#: [id number]      Subscriber: patient CM Name:        Phone#:       Fax#:   Pre-Cert#:        Employer: Disabled for about 10 years Benefits:  Phone #:       Name: Checked in Passport one source Ola. Date: 06/27/12     Deduct: $1364      Out of Pocket Max: None      Life Max: [name] CIR: 100%      SNF: 100 days Outpatient: 80%     Co-Pay: 20% Home Health: 100%      Co-Pay: none DME: 80%     Co-Pay: 20% Providers: patient's choice  SECONDARY: BCBS      Policy#: [id number]      Subscriber:  Lynett Fish CM Name:        Phone#:       Fax#:   Pre-Cert#:        Employer: FT spouse Benefits:  Phone #:  [phone]     Name:   Eff. Date:       Deduct:        Out of Pocket Max:        Life Max:   CIR:        SNF:   Outpatient:       Co-Pay:   Home Health:        Co-Pay:   DME:       Co-Pay:    Emergency Contact Information Contact Information    Name Relation Home Work Mobile   Cantrall,Jeff Spouse   [phone]   DeJesus,Jaye Daughter   [phone]     Current Medical History  Patient Admitting Diagnosis: Functional and cognitive deficits secondary to left BKA and hemorrhagic left parietal mass    History of Present Illness: A 42 y.o. female with histoyr of CAD, ESRD, T2DM, right foot wounds s/p revascularization complicated hospital course 03/2017, sacral decub and RLE ischemia requiring right metatarsal amputation with exploration of right groin wound 04/2017.  She was discharged to SNF 1/16 and readmitted later that evening due to fall out of bed.  CT head revealed hyperdense focus in left posterior parietal region suspicious for small hemorrhagic mass. Dr.  Christella Noa recommended follow up MRI but she was unable to lie flat for MRI and can be done on outpatient basis per NS.  She was found to have ascites due to fluid overload and underwent paracentesis of 9.8 L fluid. CT abdomen/pelvis done due to stage IV sacral decub and wound drainage and was positive for sacral osteomyelitis. She was started on  IV Vanc and Zosyn--ID following for input.  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 Trula Slade. Therapy evaluations done and CIR recommended due to functional deficits.   Independent prior to 03/2017. Reports that she was able to perform stand pivot transfers with  assist at [facility] but had limited therapy. Only got out of bed every other day and currently severely deconditioned.    Past Medical History  Past Medical History:  Diagnosis Date  . Arthritis   . CHF (congestive heart failure) ([place])   . Chronic anemia    2nd to renal disease  . CIN III (cervical intraepithelial neoplasia grade III) with severe dysplasia    S/P LEEP AND CONE  . Coronary artery disease    Status post cardiac catheterization June 2012 scattered coronary artery disease/atherosclerosis with 70-80% stenosis in a small right PDA.  . Diabetic Charcot's joint disease ([place])   . DM (diabetes mellitus) ([place])    Long-term insulin  . End stage renal disease on dialysis [facility]) 05/02/11   "[facility]"; [facility]; M; W, F ("got it 04/15/2017 because of Thanksgiving")  . Fracture of 5th metatarsal 2016   Right  . Gastroparesis   . Heart murmur   . 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 History of abscesses in groins 12/06/2010  . Hyperlipidemia    Hypertriglyceridemia 449 HDL 25  . Hypertension   . Migraines    "just on dialysis days"  . Peripheral neuropathy    related to DM  . Peripheral vascular disease ([place])    Tibial occlusive disease evaluated by Dr. Kellie Simmering in August 2011. Medical therapy  . Renal insufficiency    Dialysis since 2012  .  Seizures ([place])    "only when her sugar drops" (04/17/2017)  . Tobacco use disorder    Discontinued March 2012  . Trimalleolar fracture of ankle, closed 02/09/2015   Right    Family History  family history includes Breast cancer in her maternal aunt; Diabetes in her father, mother, sister, and sister; Hyperlipidemia in her father; Hypertension in her father and mother; Stroke in her maternal grandmother.  Prior Rehab/Hospitalizations: Initial hospitalization 04/10/17 with an [facility] stay and a one day SNF stay at [facility] (does not want to go back to [facility])  Has the patient had major surgery during 100 days prior to admission? No  Current Medications   Current Facility-Administered Medications:  .  0.9 %  sodium chloride infusion, , Intravenous, Continuous, Hodierne, Adam, MD, Last Rate: 10 mL/hr at 06/17/17 1146 .  0.9 %  sodium chloride infusion, 250 mL, Intravenous, PRN, Theda Sers, Emma M, PA-C .  acetaminophen (TYLENOL) 325 MG tablet, , , ,  .  acetaminophen (TYLENOL) tablet 650 mg, 650 mg, Oral, Q6H PRN, 650 mg at 06/23/17 1213 **OR** acetaminophen (TYLENOL) suppository 650 mg, 650 mg, Rectal, Q6H PRN, Rise Patience, MD .  amLODipine (NORVASC) tablet 10 mg, 10 mg, Oral, QHS, Deterding, Jeneen Rinks, MD, 10 mg at 06/22/17 2104 .  atorvastatin (LIPITOR) tablet 40 mg, 40 mg, Oral, q1800, Rise Patience, MD, 40 mg at 06/22/17 1741 .  cinacalcet (SENSIPAR) tablet 60 mg, 60 mg, Oral, Q supper, Jamal Maes, MD .  cloNIDine (CATAPRES) tablet 0.3 mg, 0.3 mg, Oral, TID, Kayleen Memos, DO, Stopped at 06/23/17 1052 .  Darbepoetin Alfa (ARANESP) injection 200 mcg, 200 mcg, Intravenous, Q Fri-HD, Mauricia Area, MD, 200 mcg at 06/20/17 0845 .  docusate (COLACE) 50 MG/5ML liquid 100 mg, 100 mg, Oral, TID, Rise Patience, MD, 100 mg at 06/23/17 1243 .  feeding supplement (NEPRO CARB STEADY) liquid 237 mL, 237 mL, Oral, BID BM, Bergman, Martha, PA-C .  feeding supplement (PRO-STAT  SUGAR FREE 64) liquid 30 mL, 30 mL, Oral, BID, Alric Seton, PA-C .  fentaNYL (  [place] - dosed mcg/hr) patch 25 mcg, 25 mcg, Transdermal, Q72H, Rise Patience, MD, 25 mcg at 06/20/17 2107 .  ferric citrate (AURYXIA) tablet 420 mg, 420 mg, Oral, TID WC, Jamal Maes, MD, 420 mg at 06/23/17 1244 .  guaiFENesin-dextromethorphan (ROBITUSSIN DM) 100-10 MG/5ML syrup 15 mL, 15 mL, Oral, Q4H PRN, Theda Sers, Emma M, PA-C .  heparin injection 5,000 Units, 5,000 Units, Subcutaneous, Q8H, Ulyses Amor, [place], 5,000 Units at 06/22/17 2105 .  hydrALAZINE (APRESOLINE) injection 10 mg, 10 mg, Intravenous, Q4H PRN, Rise Patience, MD, 10 mg at 06/23/17 0436 .  HYDROmorphone (DILAUDID) injection 1 mg, 1 mg, Intravenous, Q2H PRN, Patrecia Pour, MD, 1 mg at 06/23/17 1243 .  insulin aspart (novoLOG) injection 0-9 Units, 0-9 Units, Subcutaneous, TID WC, Rise Patience, MD, 2 Units at 06/22/17 1701 .  insulin glargine (LANTUS) injection 14 Units, 14 Units, Subcutaneous, QHS, Rise Patience, MD, 14 Units at 06/22/17 2106 .  insulin glargine (LANTUS) injection 20 Units, 20 Units, Subcutaneous, Daily, Rise Patience, MD, 20 Units at 06/23/17 1244 .  isosorbide dinitrate (ISORDIL) tablet 60 mg, 60 mg, Oral, BID, Rise Patience, MD, 60 mg at 06/23/17 1243 .  labetalol (NORMODYNE) tablet 300 mg, 300 mg, Oral, TID, Rise Patience, MD, 300 mg at 06/22/17 2105 .  labetalol (NORMODYNE,TRANDATE) injection 10 mg, 10 mg, Intravenous, Q10 min PRN, Ulyses Amor, PA-C .  LORazepam (ATIVAN) tablet 1 mg, 1 mg, Oral, Q6H PRN, Rise Patience, MD, 1 mg at 06/23/17 [phone] .  metoCLOPramide (REGLAN) tablet 2.5 mg, 2.5 mg, Oral, TID AC, Hall, Carole N, DO, 2.5 mg at 06/23/17 1245 .  metoprolol tartrate (LOPRESSOR) injection 2-5 mg, 2-5 mg, Intravenous, Q2H PRN, Collins, Emma M, PA-C .  multivitamin (RENA-VIT) tablet 1 tablet, 1 tablet, Oral, QHS, Rise Patience, MD, 1 tablet at  06/22/17 2103 .  ondansetron (ZOFRAN) tablet 4 mg, 4 mg, Oral, Q6H PRN **OR** ondansetron (ZOFRAN) injection 4 mg, 4 mg, Intravenous, Q6H PRN, Rise Patience, MD .  pantoprazole (PROTONIX) EC tablet 40 mg, 40 mg, Oral, Daily, Rise Patience, MD, 40 mg at 06/23/17 1244 .  phenol (CHLORASEPTIC) mouth spray 1 spray, 1 spray, Mouth/Throat, PRN, Theda Sers, Emma M, PA-C .  potassium chloride SA (K-DUR,KLOR-CON) CR tablet 20-40 mEq, 20-40 mEq, Oral, Daily PRN, Theda Sers, Emma M, PA-C .  sertraline (ZOLOFT) tablet 50 mg, 50 mg, Oral, Daily, Rise Patience, MD, 50 mg at 06/23/17 1245 .  sevelamer carbonate (RENVELA) tablet 2,400 mg, 2,400 mg, Oral, TID WC, Rise Patience, MD, 2,400 mg at 06/23/17 1244 .  sodium chloride flush (NS) 0.9 % injection 3 mL, 3 mL, Intravenous, Q12H, Collins, Emma M, PA-C, 3 mL at 06/23/17 1246 .  sodium chloride flush (NS) 0.9 % injection 3 mL, 3 mL, Intravenous, PRN, Ulyses Amor, PA-C  Patients Current Diet: Diet renal with fluid restriction Fluid restriction: 1200 mL Fluid; Room service appropriate? Yes; Fluid consistency: Thin  Precautions / Restrictions Precautions Precautions: Fall Precaution Comments: wound vac from groin Restrictions Weight Bearing Restrictions: Yes RLE Weight Bearing: Non weight bearing   Has the patient had 2 or more falls or a fall with injury in the past year?No  Prior Activity Level Community (5-7x/wk): Went out daily up until 04/10/17.  Not driving.    Home Assistive Devices / Equipment Home Assistive Devices/Equipment: Civil engineer, contracting with back, Wheelchair, Cane (specify quad or straight), CBG Meter, Blood pressure cuff Home Equipment: Wheelchair -  manual  Prior Device Use: Indicate devices/aids used by the patient prior to current illness, exacerbation or injury? Manual wheelchair, Archivist  Prior Functional Level Prior Function Level of Independence: Independent with assistive device(s) Comments: pt  reported that she was using w/c for mobility and could perform transfers independently. pt also stated that she was independent with ADLs. pt's daughter reported that pt was still ambulating two months ago.  Self Care: Did the patient need help bathing, dressing, using the toilet or eating?  Independent  Indoor Mobility: Did the patient need assistance with walking from room to room (with or without device)? Independent  Stairs: Did the patient need assistance with internal or external stairs (with or without device)? Independent  Functional Cognition: Did the patient need help planning regular tasks such as shopping or remembering to take medications? Independent  Current Functional Level Cognition  Overall Cognitive Status: Within Functional Limits for tasks assessed Orientation Level: Oriented X4 General Comments: Pt fatigued from HD earlier today and PT session previously this afternoon    Extremity Assessment (includes Sensation/Coordination)  Upper Extremity Assessment: Generalized weakness  Lower Extremity Assessment: RLE deficits/detail RLE Deficits / Details: new amputation RLE: Unable to fully assess due to pain    ADLs  Overall ADL's : Needs assistance/impaired Eating/Feeding: Set up, Bed level Grooming: Wash/dry face, Wash/dry hands, Min guard Grooming Details (indicate cue type and reason): sitting EOB Upper Body Bathing: Moderate assistance Lower Body Bathing: Maximal assistance Upper Body Dressing : Moderate assistance Lower Body Dressing: Maximal assistance Toilet Transfer: Maximal assistance, +2 for physical assistance, +2 for safety/equipment Toileting- Clothing Manipulation and Hygiene: Total assistance General ADL Comments: Pt limited today by fatigue from HD and previous PT session    Mobility  Overal bed mobility: Needs Assistance Bed Mobility: Supine to Sit, Sit to Supine Supine to sit: Mod assist(for trunk elevation, increased time, use of bed pad  ) Sit to supine: Min assist General bed mobility comments: increased time and effort, assist to elevate trunk    Transfers  Overall transfer level: Needs assistance Equipment used: 2 person hand held assist Transfers: Sit to/from Stand Sit to Stand: Max assist, +2 physical assistance General transfer comment: declined at this time due to fatigue and pain    Ambulation / Gait / Stairs / Office manager / Balance Dynamic Sitting Balance Sitting balance - Comments: reliant on UE support, which impacts her independence to perform seated ADL Balance Overall balance assessment: Needs assistance Sitting-balance support: Bilateral upper extremity supported Sitting balance-Leahy Scale: Poor Sitting balance - Comments: reliant on UE support, which impacts her independence to perform seated ADL Standing balance-Leahy Scale: Poor    Special needs/care consideration BiPAP/CPAP No CPM No Continuous Drip IV KVO Dialysis Yes        Days M-W-F Life Vest No Oxygen No Special Bed Yes, alternate low air loss bed Trach Size No Wound Vac (area) Yes      Location: Groin Skin: Has a left ischial wound with saline dressing; Has a wound VAC to groin for another week or so.                             Bowel mgmt: Last BM 06/22/17 Bladder mgmt: Scant voiding of urine Diabetic mgmt Yes, on insulin at home    Previous Home Environment Living Arrangements: Other (Comment) Available Help at Discharge: Family, Available PRN/intermittently Type of Home: House  Care Facility Name: [facility] Home Layout: One level Home Access: Level entry Bathroom Shower/Tub: Chiropodist: Standard Home Care Services: No Additional Comments: pt's daughter reported that pt has been in the hospital/at [facility] for the past two months and was at a SNF for less than 24 hours prior to this admission.  Discharge Living Setting Plans for Discharge Living Setting: Patient's home, House, Lives  with (comment)(Lives with husband and 73 yo daughter.) Type of Home at Discharge: Apartment Discharge Home Layout: One level Discharge Home Access: Level entry Does the patient have any problems obtaining your medications?: No  Social/Family/Support Systems Patient Roles: Spouse, Parent, Other (Comment)(Has a husband, daughter and an aunt) Contact Information: Monaye Blackie - [phone] Anticipated Caregiver: Husband, daughter, aunt Anticipated Caregiver's Contact Information:  Jearld Shines - daughter - [phone] Ability/Limitations of Caregiver: Husband and daughter work alternate shifts.  Patient says she is not alone more than about 30 minutes a day. Caregiver Availability: Other (Comment)(Has close to 24/7 supervision) Discharge Plan Discussed with Primary Caregiver: Yes Is Caregiver In Agreement with Plan?: Yes Does Caregiver/Family have Issues with Lodging/Transportation while Pt is in Rehab?: No  Goals/Additional Needs Patient/Family Goal for Rehab: PT/OT supervision to min assist, SLP supervision goals Expected length of stay: 14-20 days Cultural Considerations: None Dietary Needs: Renal diet, fluid restriction Equipment Needs: TBD Special Service Needs: HD M-W-F, goes to [facility] for outpatient HD Pt/Family Agrees to Admission and willing to participate: Yes Program Orientation Provided & Reviewed with Pt/Caregiver Including Roles  & Responsibilities: Yes  Decrease burden of Care through IP rehab admission: [name]  Possible need for SNF placement upon discharge: Yes, if patient does not progress to level where family can manage at home.  Patient hopes to avoid going to a SNF.  She had a bad experience at [facility].  Patient Condition: This patient's medical and functional status has changed since the consult dated: 06/19/17 in which the Rehabilitation Physician determined and documented that the patient's condition is appropriate for intensive rehabilitative care in  an inpatient rehabilitation facility. See "History of Present Illness" (above) for medical update. Functional changes are: Currently requiring mod to max assist +2 for mobility and ADLs. Patient's medical and functional status update has been discussed with the Rehabilitation physician and patient remains appropriate for inpatient rehabilitation. Will admit to inpatient rehab today.  Preadmission Screen Completed By:  Retta Diones, 06/23/2017 2:29 PM ______________________________________________________________________   Discussed status with Dr. Posey Pronto on 06/23/17 at 1429 and received telephone approval for admission today.  Admission Coordinator:  Retta Diones, time 1429/Date 06/23/17

## 2017-06-23 NOTE — Progress Notes (Signed)
Rehab admissions - I met with patient after HD completed today.  She would like inpatient rehab admission.  Bed available on inpatient rehab, has been cleared medically and will admit to acute inpatient rehab today.  Call me for questions.  #[phone]

## 2017-06-23 NOTE — Progress Notes (Signed)
MD --Last ABT was given on 06/18/17, patient and family asking why antibiotics are no longer being given now that pt had major surgery with infection and open pressure sores.

## 2017-06-23 NOTE — Progress Notes (Signed)
[facility]  Subjective  - Doing better everyday.  Has been accepted to CIR and will be transferred today.   Objective (!) 173/78 69 97.6 F (36.4 C) (Oral) 14 95%  Intake/Output Summary (Last 24 hours) at 06/23/2017 1456 Last data filed at 06/23/2017 1154 Gross per 24 hour  Intake 470 ml  Output 3500 ml  Net -3030 ml    Right BKA stump healing well viable Right groin wound vac in place.  Wound measurements per WOC RN note Right groin: 5cm x 1cm x 0.5cm.  95% clean, pink, granulation tissue, 5% yellow.   Lungs non labored breathing Heart  RRR   Assessment/Planning: S/P right BKA, Right transmetatarsal amputation, right femoral Endarterectomy, with intraoperative  Chest compressions and TEE.  Continue with wound vac right groin  Increase mobility and activity. F/U with Dr. Trula Slade 4 weeks from right BKA. We are pleased with her progress.  Roxy Horseman 06/23/2017 2:56 PM --  Laboratory Lab Results: Recent Labs    06/23/17 0708  WBC 9.9  HGB 7.7*  HCT 25.1*  PLT 267   BMET Recent Labs    06/23/17 0708  NA 134*  K 4.9  CL 92*  CO2 25  GLUCOSE 94  BUN 53*  CREATININE 6.20*  CALCIUM 8.6*    COAG Lab Results  Component Value Date   INR 1.14 06/17/2017   INR 1.20 06/11/2017   INR 1.19 05/09/2017   No results found for: PTT

## 2017-06-23 NOTE — Progress Notes (Signed)
Retta Diones, RN  Rehab Admission Coordinator  Physical Medicine and Rehabilitation  PMR Pre-admission  Signed  Date of Service:  06/23/2017 1:55 PM       Related encounter: ED to Hosp-Admission (Current) from 06/11/2017 in [facility]      Signed            [] Hide copied text  [] Hover for details   PMR Admission Coordinator Pre-Admission Assessment  Patient: [name] is an 42 y.o., female MRN: [id number] DOB: May 30, 1975 Height: 6\' 4"  (193 cm) Weight: 76.8 kg (169 lb 6.4 oz)                                                                                                                                  Insurance Information HMO:  No    PPO:       PCP:       IPA:       80/20:       OTHER:   PRIMARY:  Medicare A and B      Policy#: [id number]      Subscriber: patient CM Name:        Phone#:       Fax#:   Pre-Cert#:        Employer: Disabled for about 10 years Benefits:  Phone #:       Name: Checked in [place] one source [place]. Date: 06/27/12     Deduct: $1364      Out of Pocket Max: None      Life Max: [name] CIR: 100%      SNF: 100 days Outpatient: 80%     Co-Pay: 20% Home Health: 100%      Co-Pay: none DME: 80%     Co-Pay: 20% Providers: patient's choice  SECONDARY: BCBS      Policy#: [id number]      Subscriber:  Lynett Fish CM Name:        Phone#:       Fax#:   Pre-Cert#:        Employer: FT spouse Benefits:  Phone #:  [phone]     Name:   Eff. 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 She was discharged to SNF 1/16 and readmitted later that evening due to fall out of bed. CT head revealed hyperdense focus in left posterior parietal region suspicious for small hemorrhagic mass.Dr. Christella Noa recommended follow up MRI but she was unable to lie flat for MRI and can be done on outpatient basis per NS. She was found to have ascites due to fluid overload and underwent paracentesis of 9.8 L fluid. CT abdomen/pelvis done due to stage IV sacral decub and wound drainage and was positive for sacral osteomyelitis. She was started on IV Vanc and Zosyn--ID following for input. 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 Trula Slade. Therapy evaluations done and CIR recommended due to functional deficits.  Independent prior to 03/2017. Reports that she was able to perform stand pivot transfers with assist at [facility] but had limited therapy. Only got out of bed every other day and currently severely deconditioned.  Past Medical History      Past Medical History:  Diagnosis Date  . Arthritis   . CHF (congestive heart failure) ([place])   . Chronic anemia    2nd to renal disease  . CIN III (cervical intraepithelial neoplasia grade III) with severe dysplasia    S/P LEEP AND CONE  . Coronary artery disease    Status post cardiac catheterization June 2012 scattered coronary artery disease/atherosclerosis with 70-80% stenosis in a small right PDA.  . Diabetic Charcot's joint disease ([place])   . DM (diabetes mellitus) ([place])    Long-term insulin  . End stage renal disease on dialysis [facility]) 05/02/11   "[facility]"; [facility]; M; W, F ("got it 04/15/2017 because of  Thanksgiving")  . Fracture of 5th metatarsal 2016   Right  . Gastroparesis   . Heart murmur   . 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 History of abscesses in groins 12/06/2010  . Hyperlipidemia    Hypertriglyceridemia 449 HDL 25  . Hypertension   . Migraines    "just on dialysis days"  . Peripheral neuropathy    related to DM  . Peripheral vascular disease ([place])    Tibial occlusive disease evaluated by Dr. Kellie Simmering in August 2011. Medical therapy  . Renal insufficiency    Dialysis since 2012  . Seizures ([place])    "only when her sugar drops" (04/17/2017)  . Tobacco use disorder    Discontinued March 2012  . Trimalleolar fracture of ankle, closed 02/09/2015   Right    Family History  family history includes Breast cancer in her maternal aunt; Diabetes in her father, mother, sister, and sister; Hyperlipidemia in her father; Hypertension in her father and mother; Stroke in her maternal grandmother.  Prior Rehab/Hospitalizations: Initial hospitalization 04/10/17 with an [facility] stay and a one day SNF stay at [facility] (does not want to go back to [facility])  Has the patient had major surgery during 100 days prior to admission? No  Current Medications   Current Facility-Administered Medications:  .  0.9 %  sodium chloride infusion, , Intravenous, Continuous, Hodierne, Adam, MD, Last Rate: 10 mL/hr at 06/17/17 1146 .  0.9 %  sodium chloride infusion, 250 mL, Intravenous, PRN, Theda Sers, Emma M, PA-C .  acetaminophen (TYLENOL) 325 MG tablet, , , ,  .  acetaminophen (TYLENOL) tablet 650 mg, 650 mg, Oral, Q6H PRN, 650 mg at 06/23/17 1213 **OR** acetaminophen (TYLENOL) suppository 650 mg, 650 mg, Rectal, Q6H PRN, Rise Patience, MD .  amLODipine (NORVASC) tablet 10 mg, 10 mg, Oral, QHS, Deterding, Jeneen Rinks, MD, 10 mg at 06/22/17 2104 .  atorvastatin (LIPITOR) tablet 40 mg, 40 mg, Oral, q1800, Rise Patience, MD, 40 mg at 06/22/17 1741 .  cinacalcet (SENSIPAR) tablet 60 mg,  60 mg, Oral, Q supper, Jamal Maes, MD .  cloNIDine (CATAPRES) tablet 0.3 mg, 0.3 mg, Oral, TID, Kayleen Memos, DO, Stopped at 06/23/17 1052 .  Darbepoetin Alfa (ARANESP) injection 200 mcg, 200 mcg, Intravenous, Q Fri-HD, Mauricia Area, MD, 200 mcg at 06/20/17 0845 .  docusate (COLACE) 50 MG/5ML liquid 100 mg, 100 mg, Oral, TID, Rise Patience, MD, 100 mg at 06/23/17 1243 .  feeding supplement (NEPRO CARB STEADY) liquid 237 mL, 237 mL, Oral, BID BM, Bergman, Martha, PA-C .  feeding supplement (PRO-STAT SUGAR FREE 64) liquid 30 mL, 30 mL, Oral, BID, Bergman, Martha, PA-C .  fentaNYL ([place] - dosed mcg/hr) patch 25 mcg, 25 mcg, Transdermal, Q72H, Rise Patience, MD, 25 mcg at 06/20/17 2107 .  ferric citrate (AURYXIA) tablet 420 mg, 420 mg, Oral, TID WC, Jamal Maes, MD, 420 mg at 06/23/17 1244 .  guaiFENesin-dextromethorphan (ROBITUSSIN DM) 100-10 MG/5ML syrup 15 mL, 15 mL, Oral, Q4H PRN, Theda Sers, Emma M, PA-C .  heparin injection 5,000 Units, 5,000 Units, Subcutaneous, Q8H, Ulyses Amor, [place], 5,000 Units at 06/22/17 2105 .  hydrALAZINE (APRESOLINE) injection 10 mg, 10 mg, Intravenous, Q4H PRN, Rise Patience, MD, 10 mg at 06/23/17 0436 .  HYDROmorphone (DILAUDID) injection 1 mg, 1 mg, Intravenous, Q2H PRN, Patrecia Pour, MD, 1 mg at 06/23/17 1243 .  insulin aspart (novoLOG) injection 0-9 Units, 0-9 Units, Subcutaneous, TID WC, Rise Patience, MD, 2 Units at 06/22/17 1701 .  insulin glargine (LANTUS) injection 14 Units, 14 Units, Subcutaneous, QHS, Rise Patience, MD, 14 Units at 06/22/17 2106 .  insulin glargine (LANTUS) injection 20 Units, 20 Units, Subcutaneous, Daily, Rise Patience, MD, 20 Units at 06/23/17 1244 .  isosorbide dinitrate (ISORDIL) tablet 60 mg, 60 mg, Oral, BID, Rise Patience, MD, 60 mg at 06/23/17 1243 .  labetalol (NORMODYNE) tablet 300 mg, 300 mg, Oral, TID, Rise Patience, MD, 300 mg at 06/22/17 2105 .   labetalol (NORMODYNE,TRANDATE) injection 10 mg, 10 mg, Intravenous, Q10 min PRN, Ulyses Amor, PA-C .  LORazepam (ATIVAN) tablet 1 mg, 1 mg, Oral, Q6H PRN, Rise Patience, MD, 1 mg at 06/23/17 [phone] .  metoCLOPramide (REGLAN) tablet 2.5 mg, 2.5 mg, Oral, TID AC, Hall, Carole N, DO, 2.5 mg at 06/23/17 1245 .  metoprolol tartrate (LOPRESSOR) injection 2-5 mg, 2-5 mg, Intravenous, Q2H PRN, Collins, Emma M, PA-C .  multivitamin (RENA-VIT) tablet 1 tablet, 1 tablet, Oral, QHS, Rise Patience, MD, 1 tablet at 06/22/17 2103 .  ondansetron (ZOFRAN) tablet 4 mg, 4 mg, Oral, Q6H PRN **OR** ondansetron (ZOFRAN) injection 4 mg, 4 mg, Intravenous, Q6H PRN, Rise Patience, MD .  pantoprazole (PROTONIX) EC tablet 40 mg, 40 mg, Oral, Daily, Rise Patience, MD, 40 mg at 06/23/17 1244 .  phenol (CHLORASEPTIC) mouth spray 1 spray, 1 spray, Mouth/Throat, PRN, Theda Sers, Emma M, PA-C .  potassium chloride SA (K-DUR,KLOR-CON) CR tablet 20-40 mEq, 20-40 mEq, Oral, Daily PRN, Theda Sers, Emma M, PA-C .  sertraline (ZOLOFT) tablet 50 mg, 50  mg, Oral, Daily, Rise Patience, MD, 50 mg at 06/23/17 1245 .  sevelamer carbonate (RENVELA) tablet 2,400 mg, 2,400 mg, Oral, TID WC, Rise Patience, MD, 2,400 mg at 06/23/17 1244 .  sodium chloride flush (NS) 0.9 % injection 3 mL, 3 mL, Intravenous, Q12H, Collins, Emma M, PA-C, 3 mL at 06/23/17 1246 .  sodium chloride flush (NS) 0.9 % injection 3 mL, 3 mL, Intravenous, PRN, Ulyses Amor, PA-C  Patients Current Diet: Diet renal with fluid restriction Fluid restriction: 1200 mL Fluid; Room service appropriate? Yes; Fluid consistency: Thin  Precautions / Restrictions Precautions Precautions: Fall Precaution Comments: wound vac from groin Restrictions Weight Bearing Restrictions: Yes RLE Weight Bearing: Non weight bearing   Has the patient had 2 or more falls or a fall with injury in the past year?No  Prior Activity Level Community  (5-7x/wk): Went out daily up until 04/10/17.  Not driving.    Home Assistive Devices / Equipment Home Assistive Devices/Equipment: Civil engineer, contracting with back, Wheelchair, Cane (specify quad or straight), CBG Meter, Blood pressure cuff Home Equipment: Wheelchair - manual  Prior Device Use: Indicate devices/aids used by the patient prior to current illness, exacerbation or injury? Manual wheelchair, Archivist  Prior Functional Level Prior Function Level of Independence: Independent with assistive device(s) Comments: pt reported that she was using w/c for mobility and could perform transfers independently. pt also stated that she was independent with ADLs. pt's daughter reported that pt was still ambulating two months ago.  Self Care: Did the patient need help bathing, dressing, using the toilet or eating?  Independent  Indoor Mobility: Did the patient need assistance with walking from room to room (with or without device)? Independent  Stairs: Did the patient need assistance with internal or external stairs (with or without device)? Independent  Functional Cognition: Did the patient need help planning regular tasks such as shopping or remembering to take medications? Independent  Current Functional Level Cognition  Overall Cognitive Status: Within Functional Limits for tasks assessed Orientation Level: Oriented X4 General Comments: Pt fatigued from HD earlier today and PT session previously this afternoon    Extremity Assessment (includes Sensation/Coordination)  Upper Extremity Assessment: Generalized weakness  Lower Extremity Assessment: RLE deficits/detail RLE Deficits / Details: new amputation RLE: Unable to fully assess due to pain    ADLs  Overall ADL's : Needs assistance/impaired Eating/Feeding: Set up, Bed level Grooming: Wash/dry face, Wash/dry hands, Min guard Grooming Details (indicate cue type and reason): sitting EOB Upper Body Bathing: Moderate  assistance Lower Body Bathing: Maximal assistance Upper Body Dressing : Moderate assistance Lower Body Dressing: Maximal assistance Toilet Transfer: Maximal assistance, +2 for physical assistance, +2 for safety/equipment Toileting- Clothing Manipulation and Hygiene: Total assistance General ADL Comments: Pt limited today by fatigue from HD and previous PT session    Mobility  Overal bed mobility: Needs Assistance Bed Mobility: Supine to Sit, Sit to Supine Supine to sit: Mod assist(for trunk elevation, increased time, use of bed pad ) Sit to supine: Min assist General bed mobility comments: increased time and effort, assist to elevate trunk    Transfers  Overall transfer level: Needs assistance Equipment used: 2 person hand held assist Transfers: Sit to/from Stand Sit to Stand: Max assist, +2 physical assistance General transfer comment: declined at this time due to fatigue and pain    Ambulation / Gait / Stairs / Wheelchair Mobility       Posture / Balance Dynamic Sitting Balance Sitting balance - Comments: reliant  on UE support, which impacts her independence to perform seated ADL Balance Overall balance assessment: Needs assistance Sitting-balance support: Bilateral upper extremity supported Sitting balance-Leahy Scale: Poor Sitting balance - Comments: reliant on UE support, which impacts her independence to perform seated ADL Standing balance-Leahy Scale: Poor    Special needs/care consideration BiPAP/CPAP No CPM No Continuous Drip IV KVO Dialysis Yes        Days M-W-F Life Vest No Oxygen No Special Bed Yes, alternate low air loss bed Trach Size No Wound Vac (area) Yes      Location: Groin Skin: Has a left ischial wound with saline dressing; Has a wound VAC to groin for another week or so.                             Bowel mgmt: Last BM 06/22/17 Bladder mgmt: Scant voiding of urine Diabetic mgmt Yes, on insulin at home    Previous Home Environment Living  Arrangements: Other (Comment) Available Help at Discharge: Family, Available PRN/intermittently Type of Home: [place] Name: [place]: One level Home Access: Level entry Bathroom Shower/Tub: Chiropodist: Standard Home Care Services: No Additional Comments: pt's daughter reported that pt has been in the hospital/at [facility] for the past two months and was at a SNF for less than 24 hours prior to this admission.  Discharge Living Setting Plans for Discharge Living Setting: Patient's home, House, Lives with (comment)(Lives with husband and 76 yo daughter.) Type of Home at Discharge: Apartment Discharge Home Layout: One level Discharge Home Access: Level entry Does the patient have any problems obtaining your medications?: No  Social/Family/Support Systems Patient Roles: Spouse, Parent, Other (Comment)(Has a husband, daughter and an aunt) Contact Information: Sammy Douthitt - [phone] Anticipated Caregiver: Husband, daughter, aunt Anticipated Caregiver's Contact Information:  Jearld Shines - daughter - [phone] Ability/Limitations of Caregiver: Husband and daughter work alternate shifts.  Patient says she is not alone more than about 30 minutes a day. Caregiver Availability: Other (Comment)(Has close to 24/7 supervision) Discharge Plan Discussed with Primary Caregiver: Yes Is Caregiver In Agreement with Plan?: Yes Does Caregiver/Family have Issues with Lodging/Transportation while Pt is in Rehab?: No  Goals/Additional Needs Patient/Family Goal for Rehab: PT/OT supervision to min assist, SLP supervision goals Expected length of stay: 14-20 days Cultural Considerations: None Dietary Needs: Renal diet, fluid restriction Equipment Needs: TBD Special Service Needs: HD M-W-F, goes to [facility] for outpatient HD Pt/Family Agrees to Admission and willing to participate: Yes Program Orientation Provided & Reviewed with Pt/Caregiver  Including Roles  & Responsibilities: Yes  Decrease burden of Care through IP rehab admission: [name]  Possible need for SNF placement upon discharge: Yes, if patient does not progress to level where family can manage at home.  Patient hopes to avoid going to a SNF.  She had a bad experience at [facility].  Patient Condition: This patient's medical and functional status has changed since the consult dated: 06/19/17 in which the Rehabilitation Physician determined and documented that the patient's condition is appropriate for intensive rehabilitative care in an inpatient rehabilitation facility. See "History of Present Illness" (above) for medical update. Functional changes are: Currently requiring mod to max assist +2 for mobility and ADLs. Patient's medical and functional status update has been discussed with the Rehabilitation physician and patient remains appropriate for inpatient rehabilitation. Will admit to inpatient rehab today.  Preadmission Screen Completed By:  Retta Diones, 06/23/2017 2:29  PM ______________________________________________________________________   Discussed status with Dr. Posey Pronto on 06/23/17 at 1429 and received telephone approval for admission today.  Admission Coordinator:  Retta Diones, time 1429/Date 06/23/17             Cosigned by: Jamse Arn, MD at 06/23/2017 2:43 PM  Revision History

## 2017-06-23 NOTE — H&P (Signed)
Physical Medicine and Rehabilitation Admission H&P    Chief Complaint  Patient presents with  . Fall   HPI:  [name] is a 42 y.o. female with histoyr of CAD, ESRD, T2DM, right foot wounds s/p revascularization complicated hospital course 03/2017, sacral decub and RLE ischemia requiring right metatarsal amputation with exploration of right groin wound 04/2017. History taken from chart review. She was discharged to SNF 1/16 and readmitted later that evening due to fall out of bed.  CT head revealed hyperdense focus in left posterior parietal region suspicious for small hemorrhagic mass, reviewed. Dr. Christella Noa recommended follow up MRI but she was unable to lie flat for MRI and can be done on outpatient basis per NS.  She was found to have ascites due to fluid overload and underwent paracentesis of 9.8 L fluid.   CT abdomen/pelvis done due to stage IV sacral decub and wound drainage and was positive for sacral osteomyelitis. She was started on  IV Vanc and Zosyn--ID following for input.  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 Trula Slade. She continues to have VAC to right groin. Antibiotics discontinued with recommendations to monitor wound over next week as well as pressure relief measures. She is having difficulty sitting for HD due to sacral pain with question of ability to tolerate outpatient HD. CIR recommended due to functional deficits.    Review of Systems  Constitutional: Negative for chills and fever.  HENT: Negative for hearing loss and tinnitus.   Eyes: Negative for blurred vision and double vision.  Respiratory: Negative for cough and shortness of breath.   Cardiovascular: Negative for chest pain and palpitations.  Gastrointestinal: Negative for abdominal pain, heartburn and nausea.  Genitourinary: Negative for dysuria.  Musculoskeletal: Positive for back pain and myalgias.  Skin: Positive for itching (has been  itching since friday).  Neurological: Positive for weakness. Negative for dizziness and headaches.  Psychiatric/Behavioral: The patient has insomnia.   All other systems reviewed and are negative.   Past Medical History:  Diagnosis Date  . Arthritis   . CHF (congestive heart failure) ([place])   . Chronic anemia    2nd to renal disease  . CIN III (cervical intraepithelial neoplasia grade III) with severe dysplasia    S/P LEEP AND CONE  . Coronary artery disease    Status post cardiac catheterization June 2012 scattered coronary artery disease/atherosclerosis with 70-80% stenosis in a small right PDA.  . Diabetic Charcot's joint disease ([place])   . DM (diabetes mellitus) ([place])    Long-term insulin  . End stage renal disease on dialysis [facility]) 05/02/11   "[facility]"; [facility]; M; W, F ("got it 04/15/2017 because of Thanksgiving")  . Fracture of 5th metatarsal 2016   Right  . Gastroparesis   . Heart murmur   . 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 History of abscesses in groins 12/06/2010  . Hyperlipidemia    Hypertriglyceridemia 449 HDL 25  . Hypertension   . Migraines    "just on dialysis days"  . Peripheral neuropathy    related to DM  . Peripheral vascular disease ([place])    Tibial occlusive disease evaluated by Dr. Kellie Simmering in August 2011. Medical therapy  . Renal insufficiency    Dialysis since 2012  . Seizures ([place])    "only when her sugar drops" (04/17/2017)  . Tobacco use disorder    Discontinued March 2012  . Trimalleolar fracture of ankle, closed  02/09/2015   Right    Past Surgical History:  Procedure Laterality Date  . ABDOMINAL AORTOGRAM W/LOWER EXTREMITY [name] 03/27/2017   Procedure: ABDOMINAL AORTOGRAM W/LOWER EXTREMITY;  Surgeon: Conrad , MD;  Location: [place] [facility];  Service: Cardiovascular;  Laterality: [name];  . AMPUTATION Right 05/09/2017   Procedure: AMPUTATION TRANSMETATARSAL OF RIGHT FOOT;  Surgeon: Serafina Mitchell, MD;  Location: [place];  Service: Vascular;   Laterality: Right;  . AMPUTATION Right 06/17/2017   Procedure: AMPUTATION BELOW KNEE RIGHT;  Surgeon: Serafina Mitchell, MD;  [facility];  Service: Vascular;  Laterality: Right;  . AV FISTULA PLACEMENT Left 04/2010   "lower arm"  . CARDIAC CATHETERIZATION [name] 02/10/2015   Procedure: Left Heart Cath and Coronary Angiography;  Surgeon: Dixie Dials, MD;  Location: [place] [facility];  Service: Cardiovascular;  Laterality: [name];  . CARDIAC CATHETERIZATION [name] 02/21/2016   Procedure: Right Heart Cath;  Surgeon: Jolaine Artist, MD;  Location: [place] [facility];  Service: Cardiovascular;  Laterality: [name];  . CATARACT EXTRACTION W/ INTRAOCULAR LENS  IMPLANT, BILATERAL Bilateral   . CERVICAL BIOPSY  W/ LOOP ELECTRODE EXCISION     h/o  . CERVICAL CONE BIOPSY     h/o  . COLPOSCOPY    . DILATION AND CURETTAGE OF UTERUS  2009  . ENDARTERECTOMY FEMORAL Right 04/16/2017   Procedure: RIGHT FEMORAL ENDARTERECTOMY;  Surgeon: Serafina Mitchell, MD;  Location: [place];  Service: Vascular;  Laterality: Right;  . FRACTURE SURGERY    . HERNIA REPAIR    . INCISION AND DRAINAGE ABSCESS     "groin"  . INSERTION OF DIALYSIS CATHETER Left 04/16/2017   Procedure: INSERTION of temporay DIALYSIS CATHETER, left femoral artery;  Surgeon: Serafina Mitchell, MD;  Location: [place];  Service: Vascular;  Laterality: Left;  . IR HYBRID TRAUMA EMBOLIZATION  04/16/2017  . IR PARACENTESIS  06/12/2017  . ORIF ANKLE FRACTURE Right 02/16/2015   Procedure: OPEN REDUCTION INTERNAL FIXATION (ORIF) RIGHT ANKLE FRACTURE;  Surgeon: Altamese Wall, MD;  Location: [place];  Service: Orthopedics;  Laterality: Right;  . PATCH ANGIOPLASTY Right 04/16/2017   Procedure: PATCH ANGIOPLASTY OF RIGHT FEMORAL ARTERY USING BOVINE PERICARDIUM PATCH;  Surgeon: Serafina Mitchell, MD;  [facility];  Service: Vascular;  Laterality: Right;  . UMBILICAL HERNIA REPAIR  X 2  . WOUND EXPLORATION Right 05/09/2017   Procedure: EXPLORATION RIGHT GROIN;   Surgeon: Serafina Mitchell, MD;  [facility];  Service: Vascular;  Laterality: Right;    Family History  Problem Relation Age of Onset  . Diabetes Mother   . Hypertension Mother   . Diabetes Father   . Hyperlipidemia Father   . Hypertension Father   . Diabetes Sister   . Stroke Maternal Grandmother   . Diabetes Sister   . Breast cancer Maternal Aunt        Age 48's    Social History:   Lived with husband and adult daughter prior to November. Has been non-ambulatory since November and was able to perform SPT in [facility].  Husband works days but daughter plans on providing assistance after discharge.  She reports that she quit smoking about 5 years ago. Her smoking use included cigarettes. She has a 3.00 pack-year smoking history. she has never used smokeless tobacco. She reports that she does not drink alcohol or use drugs.    Allergies  Allergen Reactions  . Cephalexin Other (See Comments)    Reaction unknown- Childhood  allergy Tolerated Ceftriaxone in the past  . Sulfamethoxazole-Trimethoprim Other (See Comments)    Unknown reaction. Pt states that she was told by her mother that she had allergy to Bactrim as a child.    Medications Prior to Admission  Medication Sig Dispense Refill  . Amino Acids-Protein Hydrolys (FEEDING SUPPLEMENT, PRO-STAT SUGAR FREE 64,) LIQD Take 30 mLs by mouth 2 (two) times daily.    Marland Kitchen amLODipine (NORVASC) 10 MG tablet Take 10 mg by mouth 2 (two) times daily.     Marland Kitchen aspirin EC 81 MG EC tablet Take 1 tablet (81 mg total) by mouth daily. 30 tablet 0  . atorvastatin (LIPITOR) 40 MG tablet Take 40 mg by mouth daily at 6 PM.     . b complex-vitamin c-folic acid (NEPHRO-VITE) 0.8 MG TABS tablet Take 1 tablet by mouth daily.    . cloNIDine (CATAPRES) 0.3 MG tablet Take 0.3 mg by mouth 3 (three) times daily.    Marland Kitchen docusate (COLACE) 50 MG/5ML liquid Take 100 mg by mouth 3 (three) times daily.    . hydrALAZINE (APRESOLINE) 100 MG tablet Take 1 tablet (100 mg total)  by mouth every 8 (eight) hours. 90 tablet 1  . Insulin Glargine (LANTUS SOLOSTAR) 100 UNIT/ML Solostar Pen Inject 14 Units into the skin daily at 10 pm.     . Insulin Glargine (LANTUS SOLOSTAR) 100 UNIT/ML Solostar Pen Inject 20 Units into the skin every morning.    . isosorbide dinitrate (ISORDIL) 30 MG tablet Take 60 mg by mouth 2 (two) times daily. Give two 30 mg tablets to = 60 mg BID    . labetalol (NORMODYNE) 300 MG tablet Take 1 tablet (300 mg total) by mouth 3 (three) times daily. 270 tablet 3  . LORazepam (ATIVAN) 1 MG tablet Take 1 mg by mouth every 6 (six) hours as needed for anxiety.    . metoCLOPramide (REGLAN) 10 MG tablet Take 10 mg by mouth 3 (three) times daily.    . ondansetron (ZOFRAN) 4 MG tablet Take 4 mg by mouth every 6 (six) hours as needed for nausea or vomiting.    . pantoprazole (PROTONIX) 40 MG tablet Take 40 mg by mouth daily.    . sertraline (ZOLOFT) 50 MG tablet Take 50 mg by mouth daily.    . sevelamer carbonate (RENVELA) 800 MG tablet Take 2,400 mg by mouth 3 (three) times daily with meals. Give 3 tablets to = 2400 mg TID    . spironolactone (ALDACTONE) 100 MG tablet Take 100 mg daily by mouth.  11  . Darbepoetin Alfa (ARANESP, ALBUMIN FREE,) 200 MCG/ML SOLN Inject 200 mg as directed. Give at dialysis on Wednesday    . multivitamin (RENA-VIT) TABS tablet Take 1 tablet by mouth at bedtime. 30 tablet 0    Drug Regimen Review  Drug regimen was reviewed and remains appropriate with no significant issues identified  Home: Home Living Family/patient expects to be discharged to:: Private residence Living Arrangements: Other (Comment) Available Help at Discharge: Family, Available PRN/intermittently Type of Home: House Home Access: Level entry Home Layout: One level Bathroom Shower/Tub: Chiropodist: Standard Home Equipment: Wheelchair - manual Additional Comments: pt's daughter reported that pt has been in the hospital/at [facility] for the past  two months and was at a SNF for less than 24 hours prior to this admission.   Functional History: Prior Function Level of Independence: Independent with assistive device(s) Comments: pt reported that she was using w/c for mobility and could  perform transfers independently. pt also stated that she was independent with ADLs. pt's daughter reported that pt was still ambulating two months ago.  Functional Status:  Mobility: Bed Mobility Overal bed mobility: Needs Assistance Bed Mobility: Supine to Sit, Sit to Supine Supine to sit: Mod assist(for trunk elevation, increased time, use of bed pad ) Sit to supine: Min assist General bed mobility comments: increased time and effort, assist to elevate trunk Transfers Overall transfer level: Needs assistance Equipment used: 2 person hand held assist Transfers: Sit to/from Stand Sit to Stand: Max assist, +2 physical assistance General transfer comment: declined at this time due to fatigue and pain      ADL: ADL Overall ADL's : Needs assistance/impaired Eating/Feeding: Set up, Bed level Grooming: Wash/dry face, Wash/dry hands, Min guard Grooming Details (indicate cue type and reason): sitting EOB Upper Body Bathing: Moderate assistance Lower Body Bathing: Maximal assistance Upper Body Dressing : Moderate assistance Lower Body Dressing: Maximal assistance Toilet Transfer: Maximal assistance, +2 for physical assistance, +2 for safety/equipment Toileting- Clothing Manipulation and Hygiene: Total assistance General ADL Comments: Pt limited today by fatigue from HD and previous PT session  Cognition: Cognition Overall Cognitive Status: Within Functional Limits for tasks assessed Orientation Level: Oriented X4 Cognition Arousal/Alertness: Awake/alert Behavior During Therapy: Anxious, Flat affect Overall Cognitive Status: Within Functional Limits for tasks assessed General Comments: Pt fatigued from HD earlier today and PT session previously  this afternoon   Blood pressure (!) 173/78, pulse 69, temperature 97.6 F (36.4 C), temperature source Oral, resp. rate 14, height 6' 4" (1.93 m), weight 76.8 kg (169 lb 6.4 oz), last menstrual period 10/09/2015, SpO2 95 %. Physical Exam  Nursing note and vitals reviewed. Constitutional: She is oriented to person, place, and time. She appears well-developed. She has a sickly appearance. No distress.  Lying in bed.  Persistent episodes of itching --question due to elevated phosphorous level.  Malnourished  HENT:  Head: Normocephalic and atraumatic.  Mouth/Throat: Oropharynx is clear and moist.  Eyes: Conjunctivae and EOM are normal. Pupils are equal, round, and reactive to light. Right eye exhibits no discharge. Left eye exhibits no discharge.  Neck: Normal range of motion. Neck supple.  Cardiovascular: Normal rate and regular rhythm.  Respiratory: Effort normal and breath sounds normal. No stridor. No respiratory distress. She has no wheezes.  GI: Soft. Bowel sounds are normal. She exhibits distension.  Ascites. Large erythematous area lower abdomen --question due to scratching.   Musculoskeletal: She exhibits edema.  Keeps R-BKA flexed at knee and has discomfort with attempts at extension.  Neurological: She is alert and oriented to person, place, and time.  Motor: 3+/5 B/l UE, LLE, RLE HF  Skin: Skin is warm and dry. She is not diaphoretic.  Right groin wound with good granulation tissue  Psychiatric: Her speech is normal. Her mood appears anxious. She expresses impulsivity. She is inattentive.    Results for orders placed or performed during the hospital encounter of 06/11/17 (from the past 48 hour(s))  Glucose, capillary     Status: Abnormal   Collection Time: 06/21/17  4:30 PM  Result Value Ref Range   Glucose-Capillary 157 (H) 65 - 99 mg/dL  Glucose, capillary     Status: Abnormal   Collection Time: 06/21/17  9:31 PM  Result Value Ref Range   Glucose-Capillary 175 (H) 65 -  99 mg/dL  Glucose, capillary     Status: Abnormal   Collection Time: 06/22/17  8:03 AM  Result Value Ref Range  Glucose-Capillary 207 (H) 65 - 99 mg/dL  Glucose, capillary     Status: Abnormal   Collection Time: 06/22/17 11:53 AM  Result Value Ref Range   Glucose-Capillary 186 (H) 65 - 99 mg/dL  Glucose, capillary     Status: Abnormal   Collection Time: 06/22/17  4:46 PM  Result Value Ref Range   Glucose-Capillary 173 (H) 65 - 99 mg/dL  Glucose, capillary     Status: Abnormal   Collection Time: 06/22/17  8:43 PM  Result Value Ref Range   Glucose-Capillary 158 (H) 65 - 99 mg/dL  CBC     Status: Abnormal   Collection Time: 06/23/17  7:08 AM  Result Value Ref Range   WBC 9.9 4.0 - 10.5 K/uL   RBC 2.77 (L) 3.87 - 5.11 MIL/uL   Hemoglobin 7.7 (L) 12.0 - 15.0 g/dL   HCT 25.1 (L) 36.0 - 46.0 %   MCV 90.6 78.0 - 100.0 fL   MCH 27.8 26.0 - 34.0 pg   MCHC 30.7 30.0 - 36.0 g/dL   RDW 17.3 (H) 11.5 - 15.5 %   Platelets 267 150 - 400 K/uL  Renal function panel     Status: Abnormal   Collection Time: 06/23/17  7:08 AM  Result Value Ref Range   Sodium 134 (L) 135 - 145 mmol/L   Potassium 4.9 3.5 - 5.1 mmol/L   Chloride 92 (L) 101 - 111 mmol/L   CO2 25 22 - 32 mmol/L   Glucose, Bld 94 65 - 99 mg/dL   BUN 53 (H) 6 - 20 mg/dL   Creatinine, Ser 6.20 (H) 0.44 - 1.00 mg/dL   Calcium 8.6 (L) 8.9 - 10.3 mg/dL   Phosphorus 7.4 (H) 2.5 - 4.6 mg/dL   Albumin 1.6 (L) 3.5 - 5.0 g/dL   GFR calc non Af Amer 8 (L) >60 mL/min   GFR calc Af Amer 9 (L) >60 mL/min    Comment: (NOTE) The eGFR has been calculated using the CKD EPI equation. This calculation has not been validated in all clinical situations. eGFR's persistently <60 mL/min signify possible Chronic Kidney Disease.    Anion gap 17 (H) 5 - 15  Ferritin     Status: Abnormal   Collection Time: 06/23/17  7:08 AM  Result Value Ref Range   Ferritin 1,050 (H) 11 - 307 ng/mL  Iron and TIBC     Status: Abnormal   Collection Time: 06/23/17   7:08 AM  Result Value Ref Range   Iron 26 (L) 28 - 170 ug/dL   TIBC 113 (L) 250 - 450 ug/dL   Saturation Ratios 23 10.4 - 31.8 %   UIBC 87 ug/dL  Glucose, capillary     Status: None   Collection Time: 06/23/17 12:30 PM  Result Value Ref Range   Glucose-Capillary 89 65 - 99 mg/dL   No results found.     Medical Problem List and Plan: 1.  Deficits with mobility, transfers, self-care secondary to Right BKA and wounds. 2.  DVT Prophylaxis/Anticoagulation: Pharmaceutical: Lovenox 3. Pain Management: Currently with 49mg Fentanyl patch and IV dilaudid--will transition to oxycodone prn.   4. Mood: Offer Ego support.  5. Neuropsych: This patient is capable of making decisions on her own behalf. 6. Skin/Wound Care: has evidence of malnutrition. Has been refusing nephro as well as protein supplements. Encourage intake to help promote wound healing as well as issues with ascites.   -- Ischial  Decub: Air mattress overlay for pressure relief measures. Add  protein supplement for malnutrition. Dakin's solution with damp to dry dressing. May need plastics consult.       --abdominal rash-- to monitor areas on abdomen: Sarna lotion. Will add gabapentin for itching.      7. Fluids/Electrolytes/Nutrition: Renal diet with 1200 cc FR. Monitor daily weights.  8. Left Ischium decub with osteo: No need for antibiotics per report. Daily local care with saline dressing. Need to encourage intake of supplements to promote wound healing.   9. T2DM: Monitor BS ac/hs. Continue Lantus bid with SSI for elevated BS. Titrate as needed. 10. HTN: Monitor BP bid. Continue isosorbide bid, labetalol tid, clonidine tid and norvasc daily.  11. Pruritis: Question due to rise in phosphorous levels. Sarna tid with benadryl prn.  12. Ascites: Monitor weight daily.    Post Admission Physician Evaluation: 1. Preadmission assessment reviewed and changes made below. 2. Functional deficits secondary  to right BKA and  wounds. 3. Patient is admitted to receive collaborative, interdisciplinary care between the physiatrist, rehab nursing staff, and therapy team. 4. Patient's level of medical complexity and substantial therapy needs in context of that medical necessity cannot be provided at a lesser intensity of care such as a SNF. 5. Patient has experienced substantial functional loss from his/her baseline which was documented above under the "Functional History" and "Functional Status" headings.  Judging by the patient's diagnosis, physical exam, and functional history, the patient has potential for functional progress which will result in measurable gains while on inpatient rehab.  These gains will be of substantial and practical use upon discharge  in facilitating mobility and self-care at the household level. 83. Physiatrist will provide 24 hour management of medical needs as well as oversight of the therapy plan/treatment and provide guidance as appropriate regarding the interaction of the two. 7. 24 hour rehab nursing will assist with safety, skin/wound care, disease management, pain management and patient education  and help integrate therapy concepts, techniques,education, etc. 8. PT will assess and treat for/with: Lower extremity strength, range of motion, stamina, balance, functional mobility, safety, adaptive techniques and equipment, woundcare, coping skills, pain control, education.   Goals are: Min/Mod A. 9. OT will assess and treat for/with: ADL's, functional mobility, safety, upper extremity strength, adaptive techniques and equipment, wound mgt, ego support, and community reintegration.   Goals are: Min/Mod A. Therapy may not proceed with showering this patient. 10. Case Management and Social Worker will assess and treat for psychological issues and discharge planning. 11. Team conference will be held weekly to assess progress toward goals and to determine barriers to discharge. 12. Patient will receive at  least 3 hours of therapy per day at least 5 days per week. 13. ELOS: 18-22 days.       14. Prognosis:  good and fair  Delice Lesch, MD, ABPMR Bary Leriche, PA-C 06/23/2017

## 2017-06-23 NOTE — Progress Notes (Signed)
Physical Therapy Treatment Patient Details Name: [name] MRN: [id number] DOB: 04/10/1976 Today's Date: 06/23/2017    History of Present Illness [name] is a 42 y.o. female with history of ESRD on hemodialysis Monday Wednesday Friday recent cardiac arrest in April 16, 2017 and eventually had ventilator dependent respiratory failure and was discharged to long-term acute care and was recently discharged and tracheostomy removed was brought to the ER after patient had a fall at her living facility. Pt found to have an ~14mm hemorrhagic mass in the parietal lobe which pt wishes to f/u for MRI as an outpatient secondary to pain with lying supine. 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 PMH including but not limited to CHF, CAD, DM, ESRD, PVD.    PT Comments    Pt admitted with above diagnosis. Pt currently with functional limitations due to balance and endurance deficits. Pt was unable to stand even with max assist and use of RW and stedy.  Tried to use height of bed to assist but pt did not have the strength in the left LE to stand and was not using UEs enough to stand.  Pt was trying but just could not clear bottom off bed.  Did have dialysis today.  Going to Rehab.  Pt will benefit from skilled PT to increase their independence and safety with mobility to allow discharge to the venue listed below.     Follow Up Recommendations  CIR;Supervision/Assistance - 24 hour     Equipment Recommendations  None recommended by PT    Recommendations for Other Services       Precautions / Restrictions Precautions Precautions: Fall Precaution Comments: wound vac from groin Restrictions Weight Bearing Restrictions: Yes RLE Weight Bearing: Non weight bearing    Mobility  Bed Mobility Overal bed mobility: Needs Assistance Bed Mobility: Supine to Sit;Sit to Supine     Supine to sit: +2 for physical  assistance;Mod assist Sit to supine: +2 for physical assistance;Mod assist   General bed mobility comments: increased time and effort, assist for LEs and trunk  Transfers                 General transfer comment: unable to stand even with total assist, pt with posterior bias and significant weakness  Ambulation/Gait                 Stairs            Wheelchair Mobility    Modified Rankin (Stroke Patients Only)       Balance Overall balance assessment: Needs assistance Sitting-balance support: Bilateral upper extremity supported Sitting balance-Leahy Scale: Poor Sitting balance - Comments: Due to pts posterior bias, pt needed varying assist from min to max assist.      Standing balance-Leahy Scale: Poor Standing balance comment: unable to stand despite multiple efforts and use of RW and stedy                            Cognition Arousal/Alertness: Awake/alert Behavior During Therapy: WFL for tasks assessed/performed Overall Cognitive Status: Within Functional Limits for tasks assessed                                 General Comments: pt fatigued following HD earlier today      Exercises Amputee Exercises Hartrandt  Sets: AROM;Strengthening;Right;5 reps;Supine Knee Flexion: AROM;Strengthening;Right;5 reps;Supine    General Comments General comments (skin integrity, edema, etc.): Pt is 6 foot 4 inches      Pertinent Vitals/Pain Pain Assessment: Faces Faces Pain Scale: Hurts even more Pain Location: back, legs Pain Descriptors / Indicators: Sore;Grimacing;Guarding;Discomfort;Moaning Pain Intervention(s): Limited activity within patient's tolerance;Monitored during session;Repositioned;Patient requesting pain meds-RN notified    Home Living                      Prior Function            PT Goals (current goals can now be found in the care plan section) Acute Rehab PT Goals Patient Stated Goal: to get  stronger Progress towards PT goals: Progressing toward goals    Frequency    Min 3X/week      PT Plan Current plan remains appropriate    Co-evaluation              AM-PAC PT "6 Clicks" Daily Activity  Outcome Measure  Difficulty turning over in bed (including adjusting bedclothes, sheets and blankets)?: Unable Difficulty moving from lying on back to sitting on the side of the bed? : Unable Difficulty sitting down on and standing up from a chair with arms (e.g., wheelchair, bedside commode, etc,.)?: Unable Help needed moving to and from a bed to chair (including a wheelchair)?: Total Help needed walking in hospital room?: Total Help needed climbing 3-5 steps with a railing? : Total 6 Click Score: 6    End of Session Equipment Utilized During Treatment: Gait belt Activity Tolerance: Patient limited by pain;Patient limited by fatigue Patient left: in bed;with call bell/phone within reach;with family/visitor present;with bed alarm set;with SCD's reapplied Nurse Communication: Mobility status PT Visit Diagnosis: Other abnormalities of gait and mobility (R26.89);Pain Pain - Right/Left: Right Pain - part of body: Leg     Time: 8756-4332 PT Time Calculation (min) (ACUTE ONLY): 39 min  Charges:  $Therapeutic Exercise: 8-22 mins $Therapeutic Activity: 8-22 mins                    G Codes:       [name],PT Acute Rehabilitation [phone] (pager)    [name] 06/23/2017, 4:30 PM

## 2017-06-23 NOTE — Procedures (Signed)
I have personally attended this patient's dialysis session.   No heparin (2/2 recent IC bleed) 2K bath pending labs Hb 7.7 stable Fe studies ordered Pt stable  Jamal Maes, MD [place] Pager 06/23/2017, 8:33 AM

## 2017-06-23 NOTE — Progress Notes (Signed)
OT Cancellation Note  Patient Details Name: [name] MRN: [id number] DOB: 10/10/1975   Cancelled Treatment:    Reason Eval/Treat Not Completed: Patient at procedure or test/ unavailable(Pt in HD.)  [name] 06/23/2017, 9:09 AM  06/23/2017 Nestor Lewandowsky, OTR/L Pager: [phone]

## 2017-06-23 NOTE — Care Management Important Message (Signed)
Important Message  Patient Details  Name: [name] MRN: [id number] Date of Birth: 27-Jul-1975   Medicare Important Message Given:  Yes    Kayela Humphres 06/23/2017, 1:36 PM

## 2017-06-23 NOTE — Progress Notes (Signed)
[facility] Progress Note   Dialysis Orders: MWF NW 4h 59min  400/800 85.5kg  2/2.25 bath  L AVF  P2  Hep 2000 Will be no heparin for time being 2.2 recent small intracranial bleed - Mircera 232mcg IV q 2wks - supposed to start 06/12/17  Assessment: 1. Sacral decub/osteo-Wound care, abx stopped per ID who recommended aggressive wound care and if no iprovement then consideration of treatment for osteomyelitis (per their notes "CT noted some changes of bone and osteomyelitis possible")  2. 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 VVS following 3. Poorly healing necrotic R groin wound - wound vac placed 1/23/ ATB's since 1/17 stopped for this 4. Fall/small left parietal IC bleed -MRI/cranial imaging can be done as outpatient per neurosurgery (seen once on day of admission by Dr. Christella Noa) 5. Ascites- s/p large vol paracentesis (9.8L) 1/17. Mildly distended abd. Per primary 6. ESRD-HDMWF - HD Monday - she has not dialyzed in a chair recently - if she is to be d/c soon, we need to try this if not accepted to CIR 7. Anemiaof CKD-Hgb 7.7, Aranesp 29mcg IV q Friday -check Fe studies today 8. Secondary hyperparathyroidism- . Corrected Ca 10.5 Continue binders, reinforce compliance. Not on VDRA. Hard to know if ^ P due to timing of binder/missed binders or diet Needs 2 Ca 2 K bath On auryxia and sensipar as an outpt so adding both   9. HTN, chronic/ severe/volume -Chronically elevated BP. KeepSBP>140on HD.Continue home meds. BP still quite high - net UF 3 L Friday; no weights b/c bed scale broken - have asked nursing to get her a new bed 10. Severe malnutrition- Alb 1.7, Renavite, Renal diet with fluid restrictions- intake variable. - added nepro/prostat 11. CAD - per primary 12. Disp - CIR vs SNF- refusing return to [facility] would be optimal.Will need to be able to sit in recliner for her treatment. Today reports sacral decub more painful, says not sure would  even be able to sit it the recliner for HD. CIR would REALLY be preferable to SNF   Jamal Maes, MD [facility] [phone] Pager 06/23/2017, 7:21 AM  Subjective:    Seen in hemodialysis Says sacral decub is becoming more painful and wonders why the ATB's were stopped Not sent to HD in recliner and says "not sure I could sit for HD" Wonders why not getting her sensipar and auryxia   Objective Vitals:   06/22/17 1645 06/22/17 2111 06/23/17 0053 06/23/17 0405  BP: (!) 178/74 (!) 181/74 (!) 170/68 (!) 169/66  Pulse: 71 67  68  Resp: 18 18  18   Temp: 99.5 F (37.5 C) 98 F (36.7 C)  97.9 F (36.6 C)  TempSrc: Oral Oral  Oral  SpO2: 99% 97%  93%  Weight:      Height:       Physical Exam General: pale ill, NAD Worried about the ^ pain in her backside Seen in HD (was not sent in recliner) Heart: Regular S1S2 No S3 Lungs clear Abd distended but not tense. + BS. Not tender R groin with wound VAC in place R BKA No LLE edema Marked muscle wasting throughout Dialysis Access: left AVF + bruit currently being cannulated for HD  Additional Objective  Recent Labs  Lab 06/18/17 0612 06/19/17 0707 06/20/17 0755  NA 135 133* 133*  K 5.0 4.5 4.9  CL 95* 93* 92*  CO2 25 26 24   GLUCOSE 94 176* 181*  BUN 40* 26* 48*  CREATININE 5.70* 4.07* 5.57*  CALCIUM 8.8* 8.6* 8.7*  PHOS 6.9*  --  8.0*    Recent Labs  Lab 06/18/17 0612 06/20/17 0755  ALBUMIN 1.6* 1.7*    Recent Labs  Lab 06/17/17 0241 06/18/17 0612 06/19/17 0707 06/20/17 0754  WBC 10.9* 11.7* 11.4* 11.1*  HGB 9.2* 8.5* 8.5* 7.7*  HCT 30.5* 27.7* 27.6* 24.1*  MCV 91.0 89.6 90.5 89.6  PLT 277 265 267 260   Blood Culture    Component Value Date/Time   SDES FLUID 06/12/2017 1319   SDES FLUID PERITONEAL 06/12/2017 1319   SPECREQUEST NONE 06/12/2017 1319   SPECREQUEST BOTTLES DRAWN AEROBIC AND ANAEROBIC 06/12/2017 1319   CULT NO GROWTH 5 DAYS 06/12/2017 1319   REPTSTATUS 06/12/2017 FINAL  06/12/2017 1319   REPTSTATUS 06/17/2017 FINAL 06/12/2017 1319    Recent Labs  Lab 06/21/17 2131 06/22/17 0803 06/22/17 1153 06/22/17 1646 06/22/17 2043  GLUCAP 175* 207* 186* 173* 158*    Medications: . sodium chloride 10 mL/hr at 06/17/17 1146  . sodium chloride    . sodium chloride    . sodium chloride     . amLODipine  10 mg Oral QHS  . atorvastatin  40 mg Oral q1800  . cloNIDine  0.3 mg Oral TID  . darbepoetin (ARANESP) injection - DIALYSIS  200 mcg Intravenous Q Fri-HD  . docusate  100 mg Oral TID  . feeding supplement (NEPRO CARB STEADY)  237 mL Oral BID BM  . feeding supplement (PRO-STAT SUGAR FREE 64)  30 mL Oral BID  . fentaNYL  25 mcg Transdermal Q72H  . heparin  5,000 Units Subcutaneous Q8H  . insulin aspart  0-9 Units Subcutaneous TID WC  . insulin glargine  14 Units Subcutaneous QHS  . insulin glargine  20 Units Subcutaneous Daily  . isosorbide dinitrate  60 mg Oral BID  . labetalol  300 mg Oral TID  . metoCLOPramide  2.5 mg Oral TID AC  . multivitamin  1 tablet Oral QHS  . pantoprazole  40 mg Oral Daily  . sertraline  50 mg Oral Daily  . sevelamer carbonate  2,400 mg Oral TID WC  . sodium chloride flush  3 mL Intravenous Q12H

## 2017-06-23 NOTE — Progress Notes (Signed)
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 Bonner Puna   HPI: [name] is a 42 y.o. female with histoyr of CAD, ESRD, T2DM, right foot wounds s/p revascularization complicated hospital course 03/2017, sacral decub and RLE ischemia requiring right metatarsal amputation with exploration of right groin wound 04/2017.  She was discharged to SNF 1/16 and readmitted later that evening due to fall out of bed.  CT head revealed hyperdense focus in left posterior parietal region suspicious for small hemorrhagic mass. Dr. Christella Noa recommended follow up MRI but she was unable to lie flat for MRI and can be done on outpatient basis per NS.  She was found to have ascites due to fluid overload and underwent paracentesis of 9.8 L fluid. CT abdomen/pelvis done due to stage IV sacral decub and wound drainage and was positive for sacral osteomyelitis. She was started on  IV Vanc and Zosyn--ID following for input.  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 Trula Slade. Therapy evaluations done and CIR recommended due to functional deficits.   Independent prior to 03/2017. Reports that she was able to perform stand pivot transfers with assist at [facility] but had limited therapy. Only got out of bed every other day and currently severely deconditioned.    Review of Systems  Constitutional: Negative for chills and fever.  HENT: Negative for hearing loss.   Eyes: Negative for blurred vision and double vision.  Respiratory: Negative for  cough and shortness of breath.   Cardiovascular: Negative for chest pain, palpitations and leg swelling.  Gastrointestinal: Negative for heartburn and nausea.  Skin: Negative for itching and rash.  Neurological: Positive for sensory change, focal weakness and weakness.  Psychiatric/Behavioral: Negative for depression and memory loss.          Past Medical History:  Diagnosis Date  . Arthritis   . CHF (congestive heart failure) ([place])   . Chronic anemia    2nd to renal disease  . CIN III (cervical intraepithelial neoplasia grade III) with severe dysplasia    S/P LEEP AND CONE  . Coronary artery disease    Status post cardiac catheterization June 2012 scattered coronary artery disease/atherosclerosis with 70-80% stenosis in a small right PDA.  . Diabetic Charcot's joint disease ([place])   . DM (diabetes mellitus) ([place])    Long-term insulin  . End stage renal disease on dialysis [facility]) 05/02/11   "[facility]"; [facility]; M; W, F ("got it 04/15/2017 because of Thanksgiving")  . Fracture of 5th metatarsal 2016   Right  . Gastroparesis   . Heart murmur   . 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 History of abscesses in groins 12/06/2010  . Hyperlipidemia    Hypertriglyceridemia 449 HDL 25  . Hypertension   . Migraines    "just on dialysis days"  . Peripheral neuropathy    related to DM  . Peripheral vascular disease (HCC)    Tibial occlusive disease evaluated  by Dr. Kellie Simmering in August 2011. Medical therapy  . Renal insufficiency    Dialysis since 2012  . Seizures ([place])    "only when her sugar drops" (04/17/2017)  . Tobacco use disorder    Discontinued March 2012  . Trimalleolar fracture of ankle, closed 02/09/2015   Right         Past Surgical History:  Procedure Laterality Date  . ABDOMINAL AORTOGRAM W/LOWER EXTREMITY [name] 03/27/2017   Procedure: ABDOMINAL AORTOGRAM W/LOWER EXTREMITY;  Surgeon: Conrad Mishicot, MD;  Location: [place] CV LAB;  Service:  Cardiovascular;  Laterality: [name];  . AMPUTATION Right 05/09/2017   Procedure: AMPUTATION TRANSMETATARSAL OF RIGHT FOOT;  Surgeon: Serafina Mitchell, MD;  Location: [place];  Service: Vascular;  Laterality: Right;  . AMPUTATION Right 06/17/2017   Procedure: AMPUTATION BELOW KNEE RIGHT;  Surgeon: Serafina Mitchell, MD;  [facility];  Service: Vascular;  Laterality: Right;  . AV FISTULA PLACEMENT Left 04/2010   "lower arm"  . CARDIAC CATHETERIZATION [name] 02/10/2015   Procedure: Left Heart Cath and Coronary Angiography;  Surgeon: Dixie Dials, MD;  Location: [place] CV LAB;  Service: Cardiovascular;  Laterality: [name];  . CARDIAC CATHETERIZATION [name] 02/21/2016   Procedure: Right Heart Cath;  Surgeon: Jolaine Artist, MD;  Location: [place] CV LAB;  Service: Cardiovascular;  Laterality: [name];  . CATARACT EXTRACTION W/ INTRAOCULAR LENS  IMPLANT, BILATERAL Bilateral   . CERVICAL BIOPSY  W/ LOOP ELECTRODE EXCISION     h/o  . CERVICAL CONE BIOPSY     h/o  . COLPOSCOPY    . DILATION AND CURETTAGE OF UTERUS  2009  . ENDARTERECTOMY FEMORAL Right 04/16/2017   Procedure: RIGHT FEMORAL ENDARTERECTOMY;  Surgeon: Serafina Mitchell, MD;  Location: [place];  Service: Vascular;  Laterality: Right;  . FRACTURE SURGERY    . HERNIA REPAIR    . INCISION AND DRAINAGE ABSCESS     "groin"  . INSERTION OF DIALYSIS CATHETER Left 04/16/2017   Procedure: INSERTION of temporay DIALYSIS CATHETER, left femoral artery;  Surgeon: Serafina Mitchell, MD;  Location: [place];  Service: Vascular;  Laterality: Left;  . IR HYBRID TRAUMA EMBOLIZATION  04/16/2017  . IR PARACENTESIS  06/12/2017  . ORIF ANKLE FRACTURE Right 02/16/2015   Procedure: OPEN REDUCTION INTERNAL FIXATION (ORIF) RIGHT ANKLE FRACTURE;  Surgeon: Altamese Spencer, MD;  Location: [place];  Service: Orthopedics;  Laterality: Right;  . PATCH ANGIOPLASTY Right 04/16/2017   Procedure: PATCH ANGIOPLASTY OF RIGHT FEMORAL ARTERY USING BOVINE  PERICARDIUM PATCH;  Surgeon: Serafina Mitchell, MD;  [facility];  Service: Vascular;  Laterality: Right;  . UMBILICAL HERNIA REPAIR  X 2  . WOUND EXPLORATION Right 05/09/2017   Procedure: EXPLORATION RIGHT GROIN;  Surgeon: Serafina Mitchell, MD;  [facility];  Service: Vascular;  Laterality: Right;         Family History  Problem Relation Age of Onset  . Diabetes Mother   . Hypertension Mother   . Diabetes Father   . Hyperlipidemia Father   . Hypertension Father   . Diabetes Sister   . Stroke Maternal Grandmother   . Diabetes Sister   . Breast cancer Maternal Aunt        Age 54's    Social History:  Lived with husband and adult daughter prior to November. She was independent without AD. Husband works days. She reports that she quit smoking about 5 years ago. Her smoking use included cigarettes. She has a  3.00 pack-year smoking history. she has never used smokeless tobacco. She reports that she does not drink alcohol or use drugs.         Allergies  Allergen Reactions  . Cephalexin Other (See Comments)    Reaction unknown- Childhood allergy Tolerated Ceftriaxone in the past  . Sulfamethoxazole-Trimethoprim Other (See Comments)    Unknown reaction. Pt states that she was told by her mother that she had allergy to Bactrim as a child.         Medications Prior to Admission  Medication Sig Dispense Refill  . Amino Acids-Protein Hydrolys (FEEDING SUPPLEMENT, PRO-STAT SUGAR FREE 64,) LIQD Take 30 mLs by mouth 2 (two) times daily.    Marland Kitchen amLODipine (NORVASC) 10 MG tablet Take 10 mg by mouth 2 (two) times daily.     Marland Kitchen aspirin EC 81 MG EC tablet Take 1 tablet (81 mg total) by mouth daily. 30 tablet 0  . atorvastatin (LIPITOR) 40 MG tablet Take 40 mg by mouth daily at 6 PM.     . b complex-vitamin c-folic acid (NEPHRO-VITE) 0.8 MG TABS tablet Take 1 tablet by mouth daily.    . cloNIDine (CATAPRES) 0.3 MG tablet Take 0.3 mg by mouth 3 (three) times  daily.    Marland Kitchen docusate (COLACE) 50 MG/5ML liquid Take 100 mg by mouth 3 (three) times daily.    . hydrALAZINE (APRESOLINE) 100 MG tablet Take 1 tablet (100 mg total) by mouth every 8 (eight) hours. 90 tablet 1  . Insulin Glargine (LANTUS SOLOSTAR) 100 UNIT/ML Solostar Pen Inject 14 Units into the skin daily at 10 pm.     . Insulin Glargine (LANTUS SOLOSTAR) 100 UNIT/ML Solostar Pen Inject 20 Units into the skin every morning.    . isosorbide dinitrate (ISORDIL) 30 MG tablet Take 60 mg by mouth 2 (two) times daily. Give two 30 mg tablets to = 60 mg BID    . labetalol (NORMODYNE) 300 MG tablet Take 1 tablet (300 mg total) by mouth 3 (three) times daily. 270 tablet 3  . LORazepam (ATIVAN) 1 MG tablet Take 1 mg by mouth every 6 (six) hours as needed for anxiety.    . metoCLOPramide (REGLAN) 10 MG tablet Take 10 mg by mouth 3 (three) times daily.    . ondansetron (ZOFRAN) 4 MG tablet Take 4 mg by mouth every 6 (six) hours as needed for nausea or vomiting.    . pantoprazole (PROTONIX) 40 MG tablet Take 40 mg by mouth daily.    . sertraline (ZOLOFT) 50 MG tablet Take 50 mg by mouth daily.    . sevelamer carbonate (RENVELA) 800 MG tablet Take 2,400 mg by mouth 3 (three) times daily with meals. Give 3 tablets to = 2400 mg TID    . spironolactone (ALDACTONE) 100 MG tablet Take 100 mg daily by mouth.  11  . Darbepoetin Alfa (ARANESP, ALBUMIN FREE,) 200 MCG/ML SOLN Inject 200 mg as directed. Give at dialysis on Wednesday    . multivitamin (RENA-VIT) TABS tablet Take 1 tablet by mouth at bedtime. 30 tablet 0    Home: Home Living Family/patient expects to be discharged to:: Private residence Living Arrangements: Children Available Help at Discharge: Family, Available PRN/intermittently Type of Home: House Home Access: Level entry Home Layout: One level Bathroom Shower/Tub: Chiropodist: Standard Home Equipment: Wheelchair - manual Additional Comments: pt's  daughter reported that pt has been in the hospital/at [facility] for the past two months and was at a SNF for less  than 24 hours prior to this admission.  Functional History: Prior Function Level of Independence: Independent with assistive device(s) Comments: pt reported that she was using w/c for mobility and could perform transfers independently. pt also stated that she was independent with ADLs. pt's daughter reported that pt was still ambulating two months ago. Functional Status:  Mobility: Bed Mobility Overal bed mobility: Needs Assistance Bed Mobility: Supine to Sit, Sit to Supine Supine to sit: Mod assist(for trunk elevation, increased time, use of bed pad ) Sit to supine: Min assist General bed mobility comments: increased time and effort, assist to elevate trunk Transfers Overall transfer level: Needs assistance Equipment used: 2 person hand held assist Transfers: Sit to/from Stand Sit to Stand: Max assist, +2 physical assistance General transfer comment: declined at this time due to fatigue and pain  ADL: ADL Overall ADL's : Needs assistance/impaired Eating/Feeding: Set up, Bed level Grooming: Wash/dry face, Wash/dry hands, Min guard Grooming Details (indicate cue type and reason): sitting EOB Upper Body Bathing: Moderate assistance Lower Body Bathing: Maximal assistance Upper Body Dressing : Moderate assistance Lower Body Dressing: Maximal assistance Toilet Transfer: Maximal assistance, +2 for physical assistance, +2 for safety/equipment Toileting- Clothing Manipulation and Hygiene: Total assistance General ADL Comments: Pt limited today by fatigue from HD and previous PT session  Cognition: Cognition Overall Cognitive Status: Within Functional Limits for tasks assessed Orientation Level: Oriented X4 Cognition Arousal/Alertness: Awake/alert Behavior During Therapy: Anxious, Flat affect Overall Cognitive Status: Within Functional Limits for tasks assessed General  Comments: Pt fatigued from HD earlier today and PT session previously this afternoon  Blood pressure (!) 178/71, pulse 75, temperature 98.3 F (36.8 C), temperature source Oral, resp. rate 18, height 6\' 4"  (1.93 m), weight 76.6 kg (168 lb 14.4 oz), last menstrual period 10/09/2015, SpO2 96 %. Physical Exam  Nursing note and vitals reviewed. Constitutional: She is oriented to person, place, and time. She appears well-developed and well-nourished. No distress.  HENT:  Head: Normocephalic and atraumatic.  Mouth/Throat: Oropharynx is clear and moist.  Eyes: Conjunctivae and EOM are normal. Pupils are equal, round, and reactive to light.  Neck: Normal range of motion. Neck supple.  Cardiovascular: Normal rate and regular rhythm. Exam reveals no friction rub.  No murmur heard. Respiratory: Effort normal and breath sounds normal. No stridor. No respiratory distress. She has no wheezes.  GI: Soft. Bowel sounds are normal. She exhibits distension. There is no tenderness.  Abdomen distended.  Musculoskeletal: She exhibits no edema.  LLE-- insensate to mid shin, muscle wasting noted and with foot drop. R-BKA with dry sock, knee flexed at 20 degrees   Neurological: She is alert and oriented to person, place, and time.  Sedated. Does arouse. Slow to initiate and can answer some biographical questions.   Skin: Skin is warm and dry. No rash noted. She is not diaphoretic. No erythema.             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 Does the need for close, 24 hr/day medical supervision in concert with the patient's rehab needs make it unreasonable for this patient to be served in a less intensive setting? Potentially 2. Co-Morbidities requiring supervision/potential complications: ESRD, decubitus ulcer, CAD, ascites 3. Due to bowel management, safety, skin/wound care, disease management, medication  administration, pain management and patient education, does the patient require 24 hr/day rehab nursing? Potentially 4. Does the patient require coordinated care of a physician, rehab nurse, PT (1-2  hrs/day, 5 days/week), OT (1-2 hrs/day, 5 days/week) and SLP (1-2 hrs/day, 5 days/week) to address physical and functional deficits in the context of the above medical diagnosis(es)? Potentially Addressing deficits in the following areas: balance, endurance, locomotion, strength, transferring, bowel/bladder control, bathing, dressing, feeding, grooming, toileting, cognition and psychosocial support 5. Can the patient actively participate in an intensive therapy program of at least 3 hrs of therapy per day at least 5 days per week? Potentially 6. The potential for patient to make measurable gains while on inpatient rehab is good and fair 7. Anticipated functional outcomes upon discharge from inpatient rehab are supervision and min assist  with PT, supervision and min assist with OT, supervision with SLP. 8. Estimated rehab length of stay to reach the above functional goals is: potentially 14-20 days 9. Anticipated D/C setting: Home 10. Anticipated post D/C treatments: HH therapy and Outpatient therapy 11. Overall Rehab/Functional Prognosis: excellent  RECOMMENDATIONS: This patient's condition is appropriate for continued rehabilitative care in the following setting: potentially CIR pending medical stability.  Patient has agreed to participate in recommended program. Yes and Potentially Note that insurance prior authorization may be required for reimbursement for recommended care.  Comment: Rehab Admissions Coordinator to follow up.   Thanks,  Meredith Staggers, MD, Mellody Drown    Bary Leriche, PA-C 06/19/2017          Revision History                        Routing History

## 2017-06-23 NOTE — Progress Notes (Signed)
PT Cancellation Note  Patient Details Name: SILVIA HIGHTOWER MRN: [id number] DOB: 02/28/1976   Cancelled Treatment:    Reason Eval/Treat Not Completed: Patient at procedure or test/unavailable(Pt in HD.  Will reattempt in pm as able. )   Godfrey Pick Flor [place] 06/23/2017, 10:21 AM [facility] [phone] (pager)

## 2017-06-23 NOTE — Consult Note (Signed)
[place] Nurse wound follow up Wound type: surgical and pressure injury Measurement: Left ischium: Stage 4 pressure injury; 7cm x 5cm x 2cm  Right groin: 1.5cm x 4.5cm x 0.3cm  Wound bed: Left ischium: 100% clean, exposed bone centrally Right groin: 100% pink, clean, granulation tissues  Drainage (amount, consistency, odor)  Left ischium: moderate, serosanguinous, no odor Right groin: none in canister, scant oozing with dressing change at wound edges.   Periwound: intact  Dressing procedure/placement/frequency: Continue saline moist gauze for the ischial wound. Husband questions use of NPWT, however with her albumin at 1.6/ESRD and +osteomylitis the use of the NPWT would be contraindicated. Her nutrition must be maximized and the osteomyelitis should be treated prior to the use of NPWT  Today I have replaced her dressing with saline moist gauze dressing, topped with dry 4x4 gauze. [place] nurse removed the NPWT dressing from her right groin. Used 1pc of black foam cut into small linear fashion to fill groin wound bed, 1/4 of ostomy barrier ring used at the proximal and distal edges of the wound to aid in seal of NPWT dressing. Sealed at 149mmHG.  Patient tolerated well, she had received PO pain meds prior to arrival for the dressing change. Her husband is at the bedside for the dressing change.   The PA and MD from inpatient rehab were at the bedside also to [facility] assessed the ischial wound with me and Dr. Posey Pronto observed the right groin wound.   [place] nurse will follow along with you for continued support with wound care. Feel that after this week her right groin will no longer need the NPWT device in place. Will notify [facility] of her transfer to rehab.  [place], [place], [place]

## 2017-06-23 NOTE — Progress Notes (Signed)
Admit to unit via bed oriented to unit, orders , medications and plan of care. Reviewed safety plan and diet. Pt states an understanding of information

## 2017-06-23 NOTE — Consult Note (Signed)
   [facility] CM Inpatient Consult   06/23/2017  [name] May 20, 1976 [id number]   Chart screened for multiple admissions and following for disposition needs.  Patient is in the Medicare ACO.  Current plan for disposition is for CIR. Patient is a 42 year old with a HX of but not limited to ESRD on Hemodialysis on Monday, Wednesday, and Friday with sacral wound, Right BKA. Patient may have community follow up needs when completing inpatient rehab at [facility].  Please contact for questions or needs:  Natividad Brood, RN BSN [facility] Liaison  [phone] business mobile phone Toll free office [phone]

## 2017-06-23 NOTE — Progress Notes (Signed)
Occupational Therapy Treatment Patient Details Name: [name] MRN: [id number] DOB: [birth year] Today's Date: 06/23/2017    History of present illness [name] is a 42 y.o. female with history of ESRD on hemodialysis Monday Wednesday Friday recent cardiac arrest in April 16, 2017 and eventually had ventilator dependent respiratory failure and was discharged to long-term acute care and was recently discharged and tracheostomy removed was brought to the ER after patient had a fall at her living facility. Pt found to have an ~46mm hemorrhagic mass in the parietal lobe which pt wishes to f/u for MRI as an outpatient secondary to pain with lying supine. 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 PMH including but not limited to CHF, CAD, DM, ESRD, PVD.   OT comments  Focus of session on bed mobility, sitting EOB and attempts to stand with RW and Stedy. Pt unable to stand with 2 person assist this visit. Performed grooming at EOB with min guard assist. Pt to d/c to CIR later today.   Follow Up Recommendations  CIR;Supervision/Assistance - 24 hour    Equipment Recommendations  Wheelchair (measurements OT);Wheelchair cushion (measurements OT)    Recommendations for Other Services      Precautions / Restrictions Precautions Precautions: Fall Precaution Comments: wound vac from groin Restrictions Weight Bearing Restrictions: Yes RLE Weight Bearing: Non weight bearing       Mobility Bed Mobility Overal bed mobility: Needs Assistance Bed Mobility: Supine to Sit;Sit to Supine     Supine to sit: +2 for physical assistance;Mod assist Sit to supine: +2 for physical assistance;Mod assist   General bed mobility comments: increased time and effort, assist for LEs and trunk  Transfers                 General transfer comment: unable to stand, pt with posterior bias and significant weakness     Balance Overall balance assessment: Needs assistance   Sitting balance-Leahy Scale: Fair Sitting balance - Comments: statically       Standing balance comment: unable to stand despite multiple efforts and use of RW and stedy                           ADL either performed or assessed with clinical judgement   ADL Overall ADL's : Needs assistance/impaired     Grooming: Brushing hair;Sitting;Set up Grooming Details (indicate cue type and reason): sitting EOB                                     Vision       Perception     Praxis      Cognition Arousal/Alertness: Awake/alert Behavior During Therapy: WFL for tasks assessed/performed Overall Cognitive Status: Within Functional Limits for tasks assessed                                 General Comments: pt fatigued following HD earlier today        Exercises     Shoulder Instructions       General Comments      Pertinent Vitals/ Pain       Pain Assessment: Faces Faces Pain Scale: Hurts even more Pain Location: back, legs Pain Descriptors / Indicators: Sore;Grimacing;Guarding;Discomfort;Moaning  Home Living  Prior Functioning/Environment              Frequency  Min 2X/week        Progress Toward Goals  OT Goals(current goals can now be found in the care plan section)  Progress towards OT goals: Progressing toward goals  Acute Rehab OT Goals Patient Stated Goal: to get stronger OT Goal Formulation: With patient Time For Goal Achievement: 07/02/17 Potential to Achieve Goals: Good  Plan Discharge plan remains appropriate    Co-evaluation                 AM-PAC PT "6 Clicks" Daily Activity     Outcome Measure   Help from another person eating meals?: None Help from another person taking care of personal grooming?: A Little Help from another person toileting, which includes using toliet,  bedpan, or urinal?: Total Help from another person bathing (including washing, rinsing, drying)?: A Lot Help from another person to put on and taking off regular upper body clothing?: A Little Help from another person to put on and taking off regular lower body clothing?: A Lot 6 Click Score: 15    End of Session Equipment Utilized During Treatment: Gait belt;Rolling walker(stedy)  OT Visit Diagnosis: Unsteadiness on feet (R26.81);Other abnormalities of gait and mobility (R26.89);Muscle weakness (generalized) (M62.81);History of falling (Z91.81);Pain Pain - Right/Left: Right   Activity Tolerance Patient limited by fatigue   Patient Left in bed;with call bell/phone within reach;with bed alarm set;with family/visitor present   Nurse Communication          Time: 6440-3474 OT Time Calculation (min): 31 min  Charges: OT General Charges $OT Visit: 1 Visit OT Treatments $Therapeutic Activity: 8-22 mins  06/23/2017 Nestor Lewandowsky, OTR/L Pager: [phone] Werner Lean Haze [place] 06/23/2017, 3:18 PM

## 2017-06-24 ENCOUNTER — Inpatient Hospital Stay (HOSPITAL_COMMUNITY): Payer: Medicare Other | Admitting: Physical Therapy

## 2017-06-24 ENCOUNTER — Other Ambulatory Visit: Payer: Self-pay

## 2017-06-24 ENCOUNTER — Inpatient Hospital Stay (HOSPITAL_COMMUNITY): Payer: Medicare Other | Admitting: Occupational Therapy

## 2017-06-24 ENCOUNTER — Inpatient Hospital Stay (HOSPITAL_COMMUNITY): Payer: Medicare Other | Admitting: Speech Pathology

## 2017-06-24 ENCOUNTER — Inpatient Hospital Stay (HOSPITAL_COMMUNITY): Payer: Medicare Other

## 2017-06-24 DIAGNOSIS — R188 Other ascites: Secondary | ICD-10-CM

## 2017-06-24 DIAGNOSIS — I10 Essential (primary) hypertension: Secondary | ICD-10-CM | POA: Insufficient documentation

## 2017-06-24 DIAGNOSIS — E119 Type 2 diabetes mellitus without complications: Secondary | ICD-10-CM

## 2017-06-24 DIAGNOSIS — N186 End stage renal disease: Secondary | ICD-10-CM

## 2017-06-24 DIAGNOSIS — L89890 Pressure ulcer of other site, unstageable: Secondary | ICD-10-CM

## 2017-06-24 DIAGNOSIS — D62 Acute posthemorrhagic anemia: Secondary | ICD-10-CM | POA: Insufficient documentation

## 2017-06-24 DIAGNOSIS — D638 Anemia in other chronic diseases classified elsewhere: Secondary | ICD-10-CM

## 2017-06-24 DIAGNOSIS — S88111A Complete traumatic amputation at level between knee and ankle, right lower leg, initial encounter: Secondary | ICD-10-CM

## 2017-06-24 DIAGNOSIS — Z992 Dependence on renal dialysis: Secondary | ICD-10-CM

## 2017-06-24 HISTORY — PX: IR PARACENTESIS: IMG2679

## 2017-06-24 LAB — BODY FLUID CELL COUNT WITH DIFFERENTIAL
Lymphs, Fluid: 8 %
MONOCYTE-MACROPHAGE-SEROUS FLUID: 82 % (ref 50–90)
Neutrophil Count, Fluid: 10 % (ref 0–25)
Total Nucleated Cell Count, Fluid: 180 cu mm (ref 0–1000)

## 2017-06-24 LAB — GLUCOSE, CAPILLARY
GLUCOSE-CAPILLARY: 82 mg/dL (ref 65–99)
GLUCOSE-CAPILLARY: 117 mg/dL — AB (ref 65–99)
Glucose-Capillary: 74 mg/dL (ref 65–99)
Glucose-Capillary: 128 mg/dL — ABNORMAL HIGH (ref 65–99)

## 2017-06-24 LAB — ALBUMIN, PLEURAL OR PERITONEAL FLUID: Albumin, Fluid: 1.5 g/dL

## 2017-06-24 MED ORDER — DEXTROSE 5 % IV SOLN
1.0000 g | INTRAVENOUS | Status: DC
  Administered 2017-06-24 – 2017-07-06 (×13): 1 g via INTRAVENOUS
  Filled 2017-06-24 (×16): qty 1

## 2017-06-24 MED ORDER — LIDOCAINE HCL (PF) 1 % IJ SOLN
INTRAMUSCULAR | Status: AC | PRN
  Administered 2017-06-24: 10 mL

## 2017-06-24 MED ORDER — LIDOCAINE HCL (PF) 1 % IJ SOLN
INTRAMUSCULAR | Status: AC
  Filled 2017-06-24: qty 30

## 2017-06-24 MED ORDER — VANCOMYCIN HCL IN DEXTROSE 750-5 MG/150ML-% IV SOLN
750.0000 mg | INTRAVENOUS | Status: DC
Start: 2017-06-25 — End: 2017-07-09
  Administered 2017-06-25 – 2017-07-09 (×7): 750 mg via INTRAVENOUS
  Filled 2017-06-24 (×9): qty 150

## 2017-06-24 MED ORDER — VANCOMYCIN HCL 10 G IV SOLR
1500.0000 mg | Freq: Once | INTRAVENOUS | Status: AC
  Administered 2017-06-24: 1500 mg via INTRAVENOUS
  Filled 2017-06-24: qty 1500

## 2017-06-24 NOTE — Progress Notes (Addendum)
Social Work Assessment and Plan  Patient Details  Name: [name] MRN: [id number] Date of Birth: 1976-01-05  Today's Date: 06/24/2017  Problem List:  Patient Active Problem List   Diagnosis Date Noted  . Acute blood loss anemia   . Anemia of chronic disease   . ESRD on dialysis ([place])   . Benign essential HTN   . Diabetes mellitus type 2 in nonobese (HCC)   . Unilateral complete BKA, right, initial encounter ([place]) 06/23/2017  . Post-operative pain   . Unilateral complete BKA, right, sequela ([place])   . Stage IV pressure ulcer of sacral region ([place]) 06/16/2017  . Wound healing, delayed   . Sacral osteomyelitis ([place])   . Chronic renal failure   . Brain mass 06/11/2017  . Fall 06/11/2017  . 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 History of ETT   . Pressure injury of skin 04/22/2017  . Acute respiratory failure with hypoxia ([place])   . Cardiac arrest, cause unspecified ([place])   . Non-healing ulcer ([place])   . Hypertension 03/24/2017  . Hypertensive urgency 03/24/2017  . Cellulitis in diabetic foot ([place]) 03/24/2017  . Cellulitis 03/24/2017  . Chronic pain 03/24/2017  . Diabetic ulcer of lower leg ([place]) 03/24/2017  . Gangrene of toe of right foot ([place])   . Acute on chronic diastolic CHF (congestive heart failure) ([place]) 02/22/2016  . Fluid overload   . Pericardial effusion   . Ascites 02/15/2016  . Uncontrolled diabetes mellitus with end-stage renal disease ([place]) 02/16/2015  . Bimalleolar ankle fracture 02/12/2015  . CAD (coronary artery disease) 02/09/2015  . PAD (peripheral artery disease) ([place])   . Gastroparesis   . Chronic anemia   . Menorrhagia with regular cycle 01/04/2014  . Dysmenorrhea 01/04/2014  . Nausea & vomiting 10/16/2011  . History of noncompliance with medical treatment 05/09/2011  . Chronic UTI 05/09/2011  . CIN III (cervical intraepithelial neoplasia grade III) with severe dysplasia   . History of abscesses in groins  12/06/2010  . End stage renal disease on dialysis ([place])   . Coronary artery disease   . Hyperlipidemia   . 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 NONDEPENDENT TOBACCO USE DISORDER 12/20/2009  . Essential hypertension, benign 12/20/2009  . NEPHROTIC SYNDROME 12/20/2009   Past Medical History:  Past Medical History:  Diagnosis Date  . Arthritis   . CHF (congestive heart failure) ([place])   . Chronic anemia    2nd to renal disease  . CIN III (cervical intraepithelial neoplasia grade III) with severe dysplasia    S/P LEEP AND [place]  . Coronary artery disease    Status post cardiac catheterization June 2012 scattered coronary artery disease/atherosclerosis with 70-80% stenosis in a small right PDA.  . 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 DM (diabetes mellitus) ([place])    Long-term insulin  . End stage renal disease on dialysis [facility]) 05/02/11   "[facility]"; [facility]; M; W, F ("got it 04/15/2017 because of Thanksgiving")  . Fracture of 5th metatarsal 2016   Right  . Gastroparesis   . Heart murmur   . 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 History of abscesses in groins 12/06/2010  . Hyperlipidemia    Hypertriglyceridemia 449 HDL 25  . Hypertension   . Migraines    "just on dialysis days"  . Peripheral neuropathy    related to DM  . Peripheral vascular disease ([place])    Tibial occlusive disease evaluated by Dr. Kellie Simmering in August 2011.  Medical therapy  . Renal insufficiency    Dialysis since 2012  . Seizures ([place])    "only when her sugar drops" (04/17/2017)  . Tobacco use disorder    Discontinued March 2012  . Trimalleolar fracture of ankle, closed 02/09/2015   Right   Past Surgical History:  Past Surgical History:  Procedure Laterality Date  . ABDOMINAL AORTOGRAM W/LOWER EXTREMITY [name] 03/27/2017   Procedure: ABDOMINAL AORTOGRAM W/LOWER EXTREMITY;  Surgeon: Conrad Roosevelt, MD;  Location: [place] [facility] [facility];  Service: Cardiovascular;  Laterality: [name];  . AMPUTATION Right 05/09/2017   Procedure:  AMPUTATION TRANSMETATARSAL OF RIGHT FOOT;  Surgeon: Serafina Mitchell, MD;  Location: [facility];  Service: Vascular;  Laterality: Right;  . AMPUTATION Right 06/17/2017   Procedure: AMPUTATION BELOW KNEE RIGHT;  Surgeon: Serafina Mitchell, MD;  [facility];  Service: Vascular;  Laterality: Right;  . AV FISTULA PLACEMENT Left 04/2010   "lower arm"  . CARDIAC CATHETERIZATION [name] 02/10/2015   Procedure: Left Heart Cath and Coronary Angiography;  Surgeon: Dixie Dials, MD;  Location: [place] [facility];  Service: Cardiovascular;  Laterality: [name];  . CARDIAC CATHETERIZATION [name] 02/21/2016   Procedure: Right Heart Cath;  Surgeon: Jolaine Artist, MD;  Location: [place] [facility];  Service: Cardiovascular;  Laterality: [name];  . CATARACT EXTRACTION W/ INTRAOCULAR LENS  IMPLANT, BILATERAL Bilateral   . CERVICAL BIOPSY  W/ LOOP ELECTRODE EXCISION     h/o  . CERVICAL CONE BIOPSY     h/o  . COLPOSCOPY    . DILATION AND CURETTAGE OF UTERUS  2009  . ENDARTERECTOMY FEMORAL Right 04/16/2017   Procedure: RIGHT FEMORAL ENDARTERECTOMY;  Surgeon: Serafina Mitchell, MD;  Location: [facility];  Service: Vascular;  Laterality: Right;  . FRACTURE SURGERY    . HERNIA REPAIR    . INCISION AND DRAINAGE ABSCESS     "groin"  . INSERTION OF DIALYSIS CATHETER Left 04/16/2017   Procedure: INSERTION of temporay DIALYSIS CATHETER, left femoral artery;  Surgeon: Serafina Mitchell, MD;  Location: [facility];  Service: Vascular;  Laterality: Left;  . IR HYBRID TRAUMA EMBOLIZATION  04/16/2017  . IR PARACENTESIS  06/12/2017  . ORIF ANKLE FRACTURE Right 02/16/2015   Procedure: OPEN REDUCTION INTERNAL FIXATION (ORIF) RIGHT ANKLE FRACTURE;  Surgeon: Altamese Balltown, MD;  Location: [facility];  Service: Orthopedics;  Laterality: Right;  . PATCH ANGIOPLASTY Right 04/16/2017   Procedure: PATCH ANGIOPLASTY OF RIGHT FEMORAL ARTERY USING BOVINE PERICARDIUM PATCH;  Surgeon: Serafina Mitchell, MD;  [facility];  Service: Vascular;  Laterality: Right;  .  UMBILICAL HERNIA REPAIR  X 2  . WOUND EXPLORATION Right 05/09/2017   Procedure: EXPLORATION RIGHT GROIN;  Surgeon: Serafina Mitchell, MD;  [facility];  Service: Vascular;  Laterality: Right;   Social History:  reports that she quit smoking about 5 years ago. Her smoking use included cigarettes. She has a 3.00 pack-year smoking history. she has never used smokeless tobacco. She reports that she does not drink alcohol or use drugs.  Family / Support Systems Marital Status: Married How Long?: 10 years Patient Roles: Spouse, Parent, Other (Comment)(niece) Spouse/Significant Other: Bree Heinzelman - husband - [phone] Children: Jearld Shines - dtr - [phone] Anticipated Caregiver: Husband, daughter, aunt Ability/Limitations of Caregiver: Husband and daughter work alternate shifts.  Patient says she is not alone more than about 30 minutes a day.  CSW will need to verify this with pt's husband. Caregiver Availability: Other (Comment)(Pt reported  to CSW that she will not be alone more than 30 minutes between her dtr's and husband's work schedules.) Family Dynamics: Pt reports husband is supportive.  Social History Preferred language: English Religion: Baptist Education: high school Read: Yes Write: Yes Employment Status: Disabled(worked at [employer] prior to becoming disabled.) Date Retired/Disabled/Unemployed: 10 years ago Public relations account executive Issues: none reported Guardian/Conservator: [name] - MD has determined that pt is capable of making her own decisions.   Abuse/Neglect Abuse/Neglect Assessment Can Be Completed: Yes Physical Abuse: Denies Verbal Abuse: Denies Sexual Abuse: Denies Exploitation of patient/patient's resources: Denies Self-Neglect: Denies  Emotional Status Pt's affect, behavior and adjustment status: Pt reports feeling better and more positive since moving to CIR.  It seems she has had some difficult moments leading up to admission.  She was sleepy  toward end of our visit due to pain medications taking affect, so CSW will continue to monitor this. Recent Psychosocial Issues: Pt has not been home in 2 months with multiple hospitalizations and admissions to [facility]. Psychiatric History: none reported Substance Abuse History: none reported  Patient / Family Perceptions, Expectations & Goals Pt/Family understanding of illness & functional limitations: Pt has a good understanding of her condition. Premorbid pt/family roles/activities: Pt enjoys reading. Anticipated changes in roles/activities/participation: Pt hopes to get back to reading.  She was also more independent at home. Pt/family expectations/goals: Pt would like to get home, as it has been 2 months.  She would like to regain her independence eventually.  Community Resources [employer]: Other (Comment)([facility] at [facility] then [facility] prior to readmission to [facility] and now [facility].)  Pt goes to dialysis at [facility] M,W,F. Premorbid Home Care/DME Agencies: Other (Comment)(has a borrowed w/c; shower seat (for tub shower); BSC; cane) Transportation available at discharge: family Resource referrals recommended: Neuropsychology, Support group (specify)([place] [facility])  Discharge Planning Living Arrangements: Spouse/significant other, Children Support Systems: Spouse/significant other, Children, Other relatives Type of Residence: Private residence Insurance Resources: [employer], Multimedia programmer (specify)([employer] as secondary) Museum/gallery curator Resources: SSD, Family Support Financial Screen Referred: No Money Management: Patient, Spouse Does the patient have any problems obtaining your medications?: No Home Management: Pt's husband and dtr can assist with this while pt is recovering. Patient/Family Preliminary Plans: Pt plans to d/c to her home with her husband and dtr to assist. Social Work Anticipated  Follow Up Needs: HH/OP, Support Group Expected length of stay: 24 to 28 days  Clinical Impression CSW met with pt to introduce self and role of CSW, as well as to complete assessment.  Pt was able to talk for CSW for a while until her pain medication took effect and made her sleepy.  CSW assured her that it was fine and we would talk more later.  She was able to share her d/c plan to return home with her husband and dtr to care for her, stating it would only be about 30 minutes she would be on her own in between their work schedules.  Pt reported feeling better emotionally being on CIR and does not want to return to previous SNF, should she require SNF after CIR.  CSW acknowledged this with pt.  Pt was appreciative of CSW visit and apologized for being sleepy.  She also gave permission for CSW to talk with her husband.  CSW will talk with pt and husband after team conference tomorrow, as targeted d/c date with expected level of care will be discussed  and CSW can share this with them.  CSW told pt about amputee support group and she was not ready to make a decision on a peer support at this time, but was glad to know of resource.  CSW will continue to follow and assist as needed.  Zakir Henner, Silvestre Mesi 06/24/2017, 12:33 PM

## 2017-06-24 NOTE — Progress Notes (Signed)
Patient information reviewed and entered into eRehab system by Wes Lezotte, RN, CRRN, PPS Coordinator.  Information including medical coding and functional independence measure will be reviewed and updated through discharge.     Per nursing patient was given "Data Collection Information Summary for Patients in Inpatient Rehabilitation Facilities with attached "Privacy Act Statement-Health Care Records" upon admission.  

## 2017-06-24 NOTE — Evaluation (Signed)
Occupational Therapy Assessment and Plan  Patient Details  Name: [name] MRN: [id number] Date of Birth: 03-17-1976  OT Diagnosis: acute pain and muscle weakness (generalized) Rehab Potential: Rehab Potential (ACUTE ONLY): Fair ELOS: ~21-25 days   Today's Date: 06/24/2017 OT Individual Time: 7829-5621 OT Individual Time Calculation (min): 60 min     Problem List:  Patient Active Problem List   Diagnosis Date Noted  . Acute blood loss anemia   . Anemia of chronic disease   . ESRD on dialysis ([place])   . Benign essential HTN   . Diabetes mellitus type 2 in nonobese (HCC)   . Unilateral complete BKA, right, initial encounter ([place]) 06/23/2017  . Post-operative pain   . Unilateral complete BKA, right, sequela ([place])   . Stage IV pressure ulcer of sacral region ([place]) 06/16/2017  . Wound healing, delayed   . Sacral osteomyelitis ([place])   . Chronic renal failure   . Brain mass 06/11/2017  . Fall 06/11/2017  . 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 History of ETT   . Pressure injury of skin 04/22/2017  . Acute respiratory failure with hypoxia ([place])   . Cardiac arrest, cause unspecified ([place])   . Non-healing ulcer ([place])   . Hypertension 03/24/2017  . Hypertensive urgency 03/24/2017  . Cellulitis in diabetic foot ([place]) 03/24/2017  . Cellulitis 03/24/2017  . Chronic pain 03/24/2017  . Diabetic ulcer of lower leg ([place]) 03/24/2017  . Gangrene of toe of right foot ([place])   . Acute on chronic diastolic CHF (congestive heart failure) ([place]) 02/22/2016  . Fluid overload   . Pericardial effusion   . Ascites 02/15/2016  . Uncontrolled diabetes mellitus with end-stage renal disease ([place]) 02/16/2015  . Bimalleolar ankle fracture 02/12/2015  . CAD (coronary artery disease) 02/09/2015  . PAD (peripheral artery disease) ([place])   . Gastroparesis   . Chronic anemia   . Menorrhagia with regular cycle 01/04/2014  . Dysmenorrhea 01/04/2014  . Nausea & vomiting  10/16/2011  . History of noncompliance with medical treatment 05/09/2011  . Chronic UTI 05/09/2011  . CIN III (cervical intraepithelial neoplasia grade III) with severe dysplasia   . History of abscesses in groins 12/06/2010  . End stage renal disease on dialysis ([place])   . Coronary artery disease   . Hyperlipidemia   . 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 NONDEPENDENT TOBACCO USE DISORDER 12/20/2009  . Essential hypertension, benign 12/20/2009  . NEPHROTIC SYNDROME 12/20/2009    Past Medical History:  Past Medical History:  Diagnosis Date  . Arthritis   . CHF (congestive heart failure) ([place])   . Chronic anemia    2nd to renal disease  . CIN III (cervical intraepithelial neoplasia grade III) with severe dysplasia    S/P LEEP AND CONE  . Coronary artery disease    Status post cardiac catheterization June 2012 scattered coronary artery disease/atherosclerosis with 70-80% stenosis in a small right PDA.  . Diabetic Charcot's joint disease ([place])   . DM (diabetes mellitus) ([place])    Long-term insulin  . End stage renal disease on dialysis [facility]) 05/02/11   "[facility]"; [facility]; M; W, F ("got it 04/15/2017 because of Thanksgiving")  . Fracture of 5th metatarsal 2016   Right  . Gastroparesis   . Heart murmur   . 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 History of abscesses in groins 12/06/2010  . Hyperlipidemia    Hypertriglyceridemia 449 HDL 25  . Hypertension   . Migraines    "  just on dialysis days"  . Peripheral neuropathy    related to DM  . Peripheral vascular disease ([place])    Tibial occlusive disease evaluated by Dr. Kellie Simmering in August 2011. Medical therapy  . Renal insufficiency    Dialysis since 2012  . Seizures ([place])    "only when her sugar drops" (04/17/2017)  . Tobacco use disorder    Discontinued March 2012  . Trimalleolar fracture of ankle, closed 02/09/2015   Right   Past Surgical History:  Past Surgical History:  Procedure Laterality Date  . ABDOMINAL AORTOGRAM W/LOWER EXTREMITY  [name] 03/27/2017   Procedure: ABDOMINAL AORTOGRAM W/LOWER EXTREMITY;  Surgeon: Conrad Jersey, MD;  Location: [facility];  Service: Cardiovascular;  Laterality: [name];  . AMPUTATION Right 05/09/2017   Procedure: AMPUTATION TRANSMETATARSAL OF RIGHT FOOT;  Surgeon: Serafina Mitchell, MD;  Location: [facility];  Service: Vascular;  Laterality: Right;  . AMPUTATION Right 06/17/2017   Procedure: AMPUTATION BELOW KNEE RIGHT;  Surgeon: Serafina Mitchell, MD;  [facility];  Service: Vascular;  Laterality: Right;  . AV FISTULA PLACEMENT Left 04/2010   "lower arm"  . CARDIAC CATHETERIZATION [name] 02/10/2015   Procedure: Left Heart Cath and Coronary Angiography;  Surgeon: Dixie Dials, MD;  Location: [facility];  Service: Cardiovascular;  Laterality: [name];  . CARDIAC CATHETERIZATION [name] 02/21/2016   Procedure: Right Heart Cath;  Surgeon: Jolaine Artist, MD;  Location: [facility];  Service: Cardiovascular;  Laterality: [name];  . CATARACT EXTRACTION W/ INTRAOCULAR LENS  IMPLANT, BILATERAL Bilateral   . CERVICAL BIOPSY  W/ LOOP ELECTRODE EXCISION     h/o  . CERVICAL CONE BIOPSY     h/o  . COLPOSCOPY    . DILATION AND CURETTAGE OF UTERUS  2009  . ENDARTERECTOMY FEMORAL Right 04/16/2017   Procedure: RIGHT FEMORAL ENDARTERECTOMY;  Surgeon: Serafina Mitchell, MD;  Location: [facility];  Service: Vascular;  Laterality: Right;  . FRACTURE SURGERY    . HERNIA REPAIR    . INCISION AND DRAINAGE ABSCESS     "groin"  . INSERTION OF DIALYSIS CATHETER Left 04/16/2017   Procedure: INSERTION of temporay DIALYSIS CATHETER, left femoral artery;  Surgeon: Serafina Mitchell, MD;  Location: [facility];  Service: Vascular;  Laterality: Left;  . IR HYBRID TRAUMA EMBOLIZATION  04/16/2017  . IR PARACENTESIS  06/12/2017  . ORIF ANKLE FRACTURE Right 02/16/2015   Procedure: OPEN REDUCTION INTERNAL FIXATION (ORIF) RIGHT ANKLE FRACTURE;  Surgeon: Altamese Speed, MD;  Location: [facility];  Service: Orthopedics;  Laterality: Right;  .  PATCH ANGIOPLASTY Right 04/16/2017   Procedure: PATCH ANGIOPLASTY OF RIGHT FEMORAL ARTERY USING BOVINE PERICARDIUM PATCH;  Surgeon: Serafina Mitchell, MD;  [facility];  Service: Vascular;  Laterality: Right;  . UMBILICAL HERNIA REPAIR  X 2  . WOUND EXPLORATION Right 05/09/2017   Procedure: EXPLORATION RIGHT GROIN;  Surgeon: Serafina Mitchell, MD;  [facility];  Service: Vascular;  Laterality: Right;    Assessment & Plan Clinical Impression: Patient is a 42 y.o. year old female with histoyr of CAD, ESRD, T2DM, right foot wounds s/p revascularization complicated hospital course 03/2017, sacral decub and RLE ischemia requiring right metatarsal amputation with exploration of right groin wound 04/2017. History taken from chart review. She was discharged to SNF 1/16 and readmitted later that evening due to fall out of bed. CT head revealed hyperdense focus in left posterior parietal region suspicious for small hemorrhagic mass, reviewed.Dr. Christella Noa recommended follow  up MRI but she was unable to lie flat for MRI and can be done on outpatient basis per NS. She was found to have ascites due to fluid overload and underwent paracentesis of 9.8 L fluid.   CT abdomen/pelvis done due to stage IV sacral decub and wound drainage and was positive for sacral osteomyelitis. She was started on IV Vanc and Zosyn--ID following for input. 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 Trula Slade. She continues to have VAC to right groin. Antibiotics discontinued with recommendations to monitor wound over next week as well as pressure relief measures. She is having difficulty sitting for HD due to sacral pain with question of ability to tolerate outpatient HD.    Patient transferred to CIR on 06/23/2017 .    Patient currently requires max with basic self-care skills secondary to muscle weakness, decreased cardiorespiratoy endurance, abnormal tone and decreased  sitting balance, decreased standing balance, decreased postural control and difficulty maintaining precautions.  Prior to hospitalization, patient could complete ADL back in November with A (min?)  Patient will benefit from skilled intervention to decrease level of assist with basic self-care skills and increase independence with basic self-care skills prior to discharge home with care partner.  Anticipate patient will require minimal physical assistance and follow up home health.  OT - End of Session Activity Tolerance: Tolerates 30+ min activity with multiple rests Endurance Deficit: Yes OT Assessment Rehab Potential (ACUTE ONLY): Fair OT Barriers to Discharge: Wound Care OT Patient demonstrates impairments in the following area(s): Balance;Perception;Cognition;Edema;Endurance;Motor;Nutrition;Pain;Safety;Sensory;Skin Integrity OT Basic ADL's Functional Problem(s): Grooming;Bathing;Dressing;Toileting OT Transfers Functional Problem(s): Toilet OT Additional Impairment(s): None OT Plan OT Intensity: Minimum of 1-2 x/day, 45 to 90 minutes OT Frequency: 5 out of 7 days OT Duration/Estimated Length of Stay: ~21-25 days OT Treatment/Interventions: Balance/vestibular training;Discharge planning;Pain management;Self Care/advanced ADL retraining;Therapeutic Activities;UE/LE Coordination activities;Cognitive remediation/compensation;Disease mangement/prevention;Functional mobility training;Patient/family education;Skin care/wound managment;Therapeutic Exercise;UE/LE Strength taining/ROM;Splinting/orthotics;Psychosocial support;Neuromuscular re-education;DME/adaptive equipment instruction;Community reintegration OT Self Feeding Anticipated Outcome(s): n/a OT Basic Self-Care Anticipated Outcome(s): min A  OT Toileting Anticipated Outcome(s): min A OT Bathroom Transfers Anticipated Outcome(s): min A  OT Recommendation Recommendations for Other Services: Neuropsych consult Patient destination:  Home Follow Up Recommendations: Home health OT;24 hour supervision/assistance Equipment Recommended: To be determined   Skilled Therapeutic Intervention Ot eval initiated with OT goals, purpose and roles discussed with pt and pt's husband.  Pt reports not being home since November. Pt presents with significant overall weakness and unable to perform sit to stand safely with one person. Pt came to EOB with max A and with A for bilateral legs to come to EOB. Pt able to sit EOB with min A with UE support. Used Maxi move to transition into tilt in space with self check Roho cushion. Discussed with pt and nursing out out of bed schedule and pressure relief every 30 min. Pt reports being able to tolerate sitting in w/c Perform grooming at sink with setup.  Anticipate working towards squat pivot transfers- but need to achieve bottom clearance with transfers due to significant decubital wound on sacral region.   OT Evaluation Precautions/Restrictions  Precautions Precautions: Fall Precaution Comments: wound vac from groin, sacral decubital wound  Restrictions Weight Bearing Restrictions: Yes RLE Weight Bearing: Non weight bearing General Chart Reviewed: Yes Family/Caregiver Present: No   Pain  pain in residual limb and bottom- relief with rest breaks and requested meds- RN came and adminstered Home Living/Prior Functioning Home Living Family/patient expects  to be discharged to:: Private residence Living Arrangements: Spouse/significant other, Children, Other relatives Available Help at Discharge: Family, Available PRN/intermittently Type of Home: House Home Access: Level entry Home Layout: One level Bathroom Shower/Tub: Tub/shower unit Additional Comments: pt's daughter reported that pt has been in the hospital/at [facility] for the past two months and was at a SNF for less than 24 hours prior to this admission.  Lives With: Spouse, Daughter ADL ADL ADL Comments: see functional  naviagtor Vision Baseline Vision/History: No visual deficits Patient Visual Report: No change from baseline Perception  Perception: Within Functional Limits Praxis Praxis: Intact Cognition Overall Cognitive Status: Within Functional Limits for tasks assessed Arousal/Alertness: Awake/alert Orientation Level: Person;Place;Situation Person: Oriented Place: Oriented Situation: Oriented Year: 2019 Month: January Day of Week: Correct Memory: Appears intact Immediate Memory Recall: Sock;Blue;Bed Memory Recall: Bed;Blue Memory Recall Blue: With Cue Memory Recall Bed: Without Cue Attention: Sustained Sustained Attention: Appears intact Awareness: Appears intact Safety/Judgment: Appears intact Sensation Sensation Light Touch: Appears Intact Stereognosis: Appears Intact Proprioception: Appears Intact Coordination Gross Motor Movements are Fluid and Coordinated: No Fine Motor Movements are Fluid and Coordinated: Yes Motor  Motor Motor - Skilled Clinical Observations: generalized weakness; acute pain Mobility  Bed Mobility Bed Mobility: Supine to Sit Supine to Sit: 2: Max assist Transfers Transfers: Not assessed  Trunk/Postural Assessment  Cervical Assessment Cervical Assessment: Within Functional Limits Thoracic Assessment Thoracic Assessment: Within Functional Limits Lumbar Assessment Lumbar Assessment: (posterior pelvic tilt) Postural Control Postural Control: Deficits on evaluation Trunk Control: requires min A and UB support at EOB  Balance Dynamic Sitting Balance Sitting balance - Comments: Due to pts posterior bias, pt needed varying assist from min to max assist. Unable to stand safely during the eval Extremity/Trunk Assessment RUE Assessment RUE Assessment: Within Functional Limits(generalized weakness) LUE Assessment LUE Assessment: Within Functional Limits(generalized weakness)   See Function Navigator for Current Functional Status.   Refer to Care Plan  for Long Term Goals  Recommendations for other services: Neuropsych   Discharge Criteria: Patient will be discharged from OT if patient refuses treatment 3 consecutive times without medical reason, if treatment goals not met, if there is a change in medical status, if patient makes no progress towards goals or if patient is discharged from hospital.  The above assessment, treatment plan, treatment alternatives and goals were discussed and mutually agreed upon: by patient  Nicoletta Ba 06/24/2017, 11:01 AM

## 2017-06-24 NOTE — Progress Notes (Signed)
[facility] Progress Note   Subjective:     Says sacral decub is becoming more painful and wonders why the ATB's were stopped HD yesterday in the bed Says no way could sit through HD in a chair Ascites has re accumulated and rehab wonders if could retap (yes)  Objective Vitals:   06/23/17 1649 06/23/17 2103 06/24/17 0208  BP: (!) 164/60 (!) 161/59 (!) 157/67  Pulse: 68 74 77  Resp: 16  16  Temp: 98.2 F (36.8 C)  98.3 F (36.8 C)  TempSrc: Oral    SpO2: 97%  99%  Weight: 70 kg (154 lb 5.2 oz)    Height: 6\' 4"  (1.93 m)     Physical Exam General: pale ill, NAD Frail and almost skeletal in appearance ? Small mild L facial droop Regular S1S2 No S3 Lungs clear Abd distended tight but not tense.  + BS. Not tender R groin with wound VAC in place R BKA No LLE edema Marked muscle wasting throughout Dialysis Access: left AVF +  Additional Objective  Recent Labs  Lab 06/18/17 0612 06/19/17 0707 06/20/17 0755 06/23/17 0708  NA 135 133* 133* 134*  K 5.0 4.5 4.9 4.9  CL 95* 93* 92* 92*  CO2 25 26 24 25   GLUCOSE 94 176* 181* 94  BUN 40* 26* 48* 53*  CREATININE 5.70* 4.07* 5.57* 6.20*  CALCIUM 8.8* 8.6* 8.7* 8.6*  PHOS 6.9*  --  8.0* 7.4*    Recent Labs  Lab 06/18/17 0612 06/20/17 0755 06/23/17 0708  ALBUMIN 1.6* 1.7* 1.6*    Recent Labs  Lab 06/18/17 0612 06/19/17 0707 06/20/17 0754 06/23/17 0708  WBC 11.7* 11.4* 11.1* 9.9  HGB 8.5* 8.5* 7.7* 7.7*  HCT 27.7* 27.6* 24.1* 25.1*  MCV 89.6 90.5 89.6 90.6  PLT 265 267 260 267   Blood Culture    Component Value Date/Time   SDES FLUID 06/12/2017 1319   SDES FLUID PERITONEAL 06/12/2017 1319   SPECREQUEST NONE 06/12/2017 1319   SPECREQUEST BOTTLES DRAWN AEROBIC AND ANAEROBIC 06/12/2017 1319   CULT NO GROWTH 5 DAYS 06/12/2017 1319   REPTSTATUS 06/12/2017 FINAL 06/12/2017 1319   REPTSTATUS 06/17/2017 FINAL 06/12/2017 1319    Recent Labs  Lab 06/22/17 2043 06/23/17 1230 06/23/17 1811  06/23/17 2101 06/24/17 0629  GLUCAP 158* 89 89 125* 82    Medications:  . amLODipine  10 mg Oral QHS  . atorvastatin  40 mg Oral q1800  . cinacalcet  60 mg Oral Q supper  . cloNIDine  0.3 mg Oral TID  . [START ON 06/27/2017] darbepoetin (ARANESP) injection - DIALYSIS  200 mcg Intravenous Q Fri-HD  . docusate  100 mg Oral TID  . feeding supplement (NEPRO CARB STEADY)  237 mL Oral TID WC  . fentaNYL  25 mcg Transdermal Q72H  . ferric citrate  420 mg Oral TID WC  . gabapentin  100 mg Oral QHS  . heparin  5,000 Units Subcutaneous Q8H  . insulin aspart  0-9 Units Subcutaneous TID WC  . insulin glargine  14 Units Subcutaneous QHS  . insulin glargine  20 Units Subcutaneous Daily  . isosorbide dinitrate  60 mg Oral BID  . labetalol  300 mg Oral TID  . metoCLOPramide  2.5 mg Oral TID AC  . multivitamin  1 tablet Oral QHS  . pantoprazole  40 mg Oral Daily  . protein supplement  1 scoop Oral TID WC  . sertraline  50 mg Oral Daily  . sevelamer carbonate  2,400 mg Oral TID WC   Dialysis Orders: MWF [facility] 4h 18min  400/800 85.5kg  2/2.25 bath  L AVF  P2  Hep 2000 Will be no heparin for time being 2.2 recent small intracranial bleed - Mircera 213mcg IV q 2wks - supposed to start 06/12/17  Assessment: 1. Sacral decub/osteo-Wound care;  abx stopped per ID who recommended aggressive wound care and if no improvement then consideration of treatment for osteomyelitis (per their notes "CT noted some changes of bone and osteomyelitis possible") patient quite worried about her backside with the increasing pain. Rehab team to reconsult ID. See if recommendations any different.  2. 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 VVS following 3. Poorly healing necrotic R groin wound - wound vac placed 1/23/ ATB's since 1/17 stopped for this 4. Fall/small left parietal IC bleed -MRI/cranial imaging can be done as outpatient per neurosurgery (seen once on day of admission by Dr. Christella Noa) 5. Ascites- s/p large vol paracentesis  (9.8L) 1/17. Mildly distended abd. OK from my standpoint for retap  6. ESRD-HDMWF  - she has not dialyzed in a chair recently - says no way she could do this now 2/2 severe sacral pain 7. Anemiaof CKD-Hgb 7.7, Aranesp 252mcg IV q Friday - check Fe studies today 8. Secondary hyperparathyroidism- . Corrected Ca 10.5 Continue binders, reinforce compliance. Not on VDRA. Hard to know if ^ P due to timing of binder/missed binders or diet Needs 2 Ca 2 K bath On auryxia and sensipar as an outpt so resumed both   9. HTN, chronic/ severe/volume -Chronically elevated BP. KeepSBP>140on HD.Continue home meds. 10. Severe malnutrition- Alb 1.7, Renavite, Renal diet with fluid restrictions- intake variable -  added nepro/prostat and appears she is taking at least part of the time 11. CAD - per primary 12. Disp - CIR vs SNF- refused return to [facility]. Transferred to Rehab. Will need to be able to sit in recliner for her treatment. Reports sacral decub more painful, says would not be able to sit it the recliner for HD with current pain level.   Jamal Maes, MD [facility] [phone] Pager 06/24/2017, 10:33 AM

## 2017-06-24 NOTE — Progress Notes (Signed)
[facility] Individual Statement of Services  Patient Name:  [name]  Date:  06/24/2017  Welcome to the [facility].  Our goal is to provide you with an individualized program based on your diagnosis and situation, designed to meet your specific needs.  With this comprehensive rehabilitation program, you will be expected to participate in at least 3 hours of rehabilitation therapies Monday-Friday, with modified therapy programming on the weekends.  Your rehabilitation program will include the following services:  Physical Therapy (PT), Occupational Therapy (OT), Speech Therapy (ST), 24 hour per day rehabilitation nursing, Neuropsychology, Case Management (Social Worker), Rehabilitation Medicine, Nutrition Services and Pharmacy Services  Weekly team conferences will be held on Wednesdays to discuss your progress.  Your Social Worker will talk with you frequently to get your input and to update you on team discussions.  Team conferences with you and your family in attendance may also be held.  Expected length of stay:  3 to 4 weeks  Overall anticipated outcome:  Minimal assistance with moderate assistance to stand and walk  Depending on your progress and recovery, your program may change. Your Social Worker will coordinate services and will keep you informed of any changes. Your Social Worker's name and contact numbers are listed  below.  The following services may also be recommended but are not provided by the [facility] will be made to provide these services after discharge if needed.  Arrangements include referral to agencies that provide these services.  Your insurance has been verified to be:  Medicare and [employer] Your primary doctor is:  Dr. Merrilee Seashore  Pertinent information will be  shared with your doctor and your insurance company.  Social Worker:  Alfonse Alpers, LCSW  [phone] or ([phone]  Information discussed with and copy given to patient by: Trey Sailors, 06/24/2017, 12:00 PM

## 2017-06-24 NOTE — Progress Notes (Signed)
[facility] PHYSICAL MEDICINE & REHABILITATION     PROGRESS NOTE  Subjective/Complaints:  Patient seen lying in bed this morning. She states she slept well overnight. She has questions about her sacral wound.  ROS: Denies CP, SOB, nausea, vomiting, diarrhea.  Objective: Vital Signs: Blood pressure (!) 157/67, pulse 77, temperature 98.3 F (36.8 C), resp. rate 16, height 6\' 4"  (1.93 m), weight 70 kg (154 lb 5.2 oz), last menstrual period 10/09/2015, SpO2 99 %. No results found. Recent Labs    06/23/17 0708  WBC 9.9  HGB 7.7*  HCT 25.1*  PLT 267   Recent Labs    06/23/17 0708  NA 134*  K 4.9  CL 92*  GLUCOSE 94  BUN 53*  CREATININE 6.20*  CALCIUM 8.6*   CBG (last 3)  Recent Labs    06/23/17 1811 06/23/17 2101 06/24/17 0629  GLUCAP 89 125* 82    Wt Readings from Last 3 Encounters:  06/23/17 70 kg (154 lb 5.2 oz)  06/23/17 76.8 kg (169 lb 6.4 oz)  06/11/17 83.5 kg (184 lb 1.3 oz)    Physical Exam:  BP (!) 157/67 (BP Location: Left Arm)   Pulse 77   Temp 98.3 F (36.8 C)   Resp 16   Ht 6\' 4"  (1.93 m)   Wt 70 kg (154 lb 5.2 oz)   LMP 10/09/2015 Comment: pt on dialysis  SpO2 99%   BMI 18.78 kg/m  Constitutional: She has a sickly appearance. No distress.  HENT: Normocephalic and atraumatic.  Eyes:  and EOM are normal. No discharge.  Cardiovascular: Normal rate and regular rhythm. No JVD. Respiratory: Effort normal and breath sounds normal.  GI: Bowel sounds are normal. She exhibits distension. Ascites.  Musculoskeletal: She exhibits edema.  Neurological: She is alert and oriented.  Motor: 3+/5 B/l UE, LLE, RLE HF  Skin: Skin is warm and dry. She is not diaphoretic. Right groin wound with VAC Ischial Ulcer Psychiatric: Her speech is normal. Her mood appears anxious. She expresses impulsivity. She is inattentive.   Assessment/Plan: 1. Functional deficits secondary to Right BKA and wounds which require 3+ hours per day of interdisciplinary therapy in a  comprehensive inpatient rehab setting. Physiatrist is providing close team supervision and 24 hour management of active medical problems listed below. Physiatrist and rehab team continue to assess barriers to discharge/monitor patient progress toward functional and medical goals.  Function:  Bathing Bathing position      Bathing parts      Bathing assist        Upper Body Dressing/Undressing Upper body dressing                    Upper body assist        Lower Body Dressing/Undressing Lower body dressing                                  Lower body assist        Toileting Toileting          Toileting assist     Transfers Chair/bed transfer             Locomotion Ambulation           Wheelchair          Cognition Comprehension Comprehension assist level: Follows complex conversation/direction with extra time/assistive device  Expression Expression assist level: Expresses complex ideas: With extra time/assistive device  Social Interaction Social Interaction assist level: Interacts appropriately with others with medication or extra time (anti-anxiety, antidepressant).  Problem Solving    Memory Memory assist level: Assistive device: No helper    Medical Problem List and Plan: 1.  Deficits with mobility, transfers, self-care secondary to Right BKA and wounds.   Begin CIR 2.  DVT Prophylaxis/Anticoagulation: Pharmaceutical: Lovenox 3. Pain Management:    Fentanyl 25 mcg patch    IV dilaudid transitioned to oxycodone prn.   4. Mood: Offer Ego support.  5. Neuropsych: This patient is capable of making decisions on her own behalf. 6. Skin/Wound Care: has evidence of malnutrition. Has been refusing nephro as well as protein supplements. Encourage intake to help promote wound healing as well as issues with ascites.              Ischial  Decub: Air mattress overlay for pressure relief measures. Added protein supplement for malnutrition.  Dakin's solution with damp to dry dressing. May need plastics consult.              Abdominal rash-- to monitor areas on abdomen: Sarna lotion. Added gabapentin for itching.    Right groin wound with VAC 7. Fluids/Electrolytes/Nutrition: Renal diet with 1200 cc FR. Monitor daily weights.  8. Left Ischium decub with osteo: No need for antibiotics per report. Daily local care with saline dressing. Need to encourage intake of supplements to promote wound healing.     Will consider wound culture 9. T2DM: Monitor BS ac/hs. Continue Lantus bid with SSI for elevated BS. Titrate as needed.   Monitor with increased acitivity 10. HTN: Monitor BP bid. Continue isosorbide bid, labetalol tid, clonidine tid and norvasc daily.    Monitor with increased activity 11. Pruritis: Question due to rise in phosphorous levels. Sarna tid with benadryl prn.  12. Ascites: Monitor weight daily.  Filed Weights   06/23/17 1649  Weight: 70 kg (154 lb 5.2 oz)    Will consult VIR 13. ESRD   Recs per Nephro 14. Acute on chronic anemia  Hb 7.7 on 1/28   Cont to monitor   LOS (Days) 1 A FACE TO FACE EVALUATION WAS PERFORMED  Marilin Kofman Lorie Phenix 06/24/2017 8:58 AM

## 2017-06-24 NOTE — Evaluation (Signed)
Speech Language Pathology Assessment and Plan  Patient Details  Name: [name] MRN: [id number] Date of Birth: Jan 28, 1976  SLP Diagnosis: Cognitive Impairments  Rehab Potential: Fair ELOS: 10-14 days for SLP     Today's Date: 06/24/2017 SLP Individual Time: 1300-1350 SLP Individual Time Calculation (min): 50 min   Problem List:  Patient Active Problem List   Diagnosis Date Noted  . Acute blood loss anemia   . Anemia of chronic disease   . ESRD on dialysis ([place])   . Benign essential HTN   . Diabetes mellitus type 2 in nonobese (HCC)   . Unilateral complete BKA, right, initial encounter ([place]) 06/23/2017  . Post-operative pain   . Unilateral complete BKA, right, sequela ([place])   . Stage IV pressure ulcer of sacral region ([place]) 06/16/2017  . Wound healing, delayed   . Sacral osteomyelitis ([place])   . Chronic renal failure   . Brain mass 06/11/2017  . Fall 06/11/2017  . 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 History of ETT   . Pressure injury of skin 04/22/2017  . Acute respiratory failure with hypoxia ([place])   . Cardiac arrest, cause unspecified ([place])   . Non-healing ulcer ([place])   . Hypertension 03/24/2017  . Hypertensive urgency 03/24/2017  . Cellulitis in diabetic foot ([place]) 03/24/2017  . Cellulitis 03/24/2017  . Chronic pain 03/24/2017  . Diabetic ulcer of lower leg ([place]) 03/24/2017  . Gangrene of toe of right foot ([place])   . Acute on chronic diastolic CHF (congestive heart failure) ([place]) 02/22/2016  . Fluid overload   . Pericardial effusion   . Ascites 02/15/2016  . Uncontrolled diabetes mellitus with end-stage renal disease ([place]) 02/16/2015  . Bimalleolar ankle fracture 02/12/2015  . CAD (coronary artery disease) 02/09/2015  . PAD (peripheral artery disease) ([place])   . Gastroparesis   . Chronic anemia   . Menorrhagia with regular cycle 01/04/2014  . Dysmenorrhea 01/04/2014  . Nausea & vomiting 10/16/2011  . History of  noncompliance with medical treatment 05/09/2011  . Chronic UTI 05/09/2011  . CIN III (cervical intraepithelial neoplasia grade III) with severe dysplasia   . History of abscesses in groins 12/06/2010  . End stage renal disease on dialysis ([employer])   . Coronary artery disease   . Hyperlipidemia   . 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 NONDEPENDENT TOBACCO USE DISORDER 12/20/2009  . Essential hypertension, benign 12/20/2009  . NEPHROTIC SYNDROME 12/20/2009   Past Medical History:  Past Medical History:  Diagnosis Date  . Arthritis   . CHF (congestive heart failure) ([place])   . Chronic anemia    2nd to renal disease  . CIN III (cervical intraepithelial neoplasia grade III) with severe dysplasia    S/P LEEP AND CONE  . Coronary artery disease    Status post cardiac catheterization June 2012 scattered coronary artery disease/atherosclerosis with 70-80% stenosis in a small right PDA.  . Diabetic Charcot's joint disease ([place])   . DM (diabetes mellitus) ([place])    Long-term insulin  . End stage renal disease on dialysis [facility]) 05/02/11   "[facility]"; [facility]; M; W, F ("got it 04/15/2017 because of Thanksgiving")  . Fracture of 5th metatarsal 2016   Right  . Gastroparesis   . Heart murmur   . 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 History of abscesses in groins 12/06/2010  . Hyperlipidemia    Hypertriglyceridemia 449 HDL 25  . Hypertension   . Migraines    "just on dialysis  days"  . Peripheral neuropathy    related to DM  . Peripheral vascular disease ([place])    Tibial occlusive disease evaluated by Dr. Kellie Simmering in August 2011. Medical therapy  . Renal insufficiency    Dialysis since 2012  . Seizures ([place])    "only when her sugar drops" (04/17/2017)  . Tobacco use disorder    Discontinued March 2012  . Trimalleolar fracture of ankle, closed 02/09/2015   Right   Past Surgical History:  Past Surgical History:  Procedure Laterality Date  . ABDOMINAL AORTOGRAM W/LOWER EXTREMITY [name] 03/27/2017   Procedure:  ABDOMINAL AORTOGRAM W/LOWER EXTREMITY;  Surgeon: Conrad Bennington, MD;  Location: [facility];  Service: Cardiovascular;  Laterality: [name];  . AMPUTATION Right 05/09/2017   Procedure: AMPUTATION TRANSMETATARSAL OF RIGHT FOOT;  Surgeon: Serafina Mitchell, MD;  Location: [facility];  Service: Vascular;  Laterality: Right;  . AMPUTATION Right 06/17/2017   Procedure: AMPUTATION BELOW KNEE RIGHT;  Surgeon: Serafina Mitchell, MD;  [facility];  Service: Vascular;  Laterality: Right;  . AV FISTULA PLACEMENT Left 04/2010   "lower arm"  . CARDIAC CATHETERIZATION [name] 02/10/2015   Procedure: Left Heart Cath and Coronary Angiography;  Surgeon: Dixie Dials, MD;  Location: [facility];  Service: Cardiovascular;  Laterality: [name];  . CARDIAC CATHETERIZATION [name] 02/21/2016   Procedure: Right Heart Cath;  Surgeon: Jolaine Artist, MD;  Location: [facility];  Service: Cardiovascular;  Laterality: [name];  . CATARACT EXTRACTION W/ INTRAOCULAR LENS  IMPLANT, BILATERAL Bilateral   . CERVICAL BIOPSY  W/ LOOP ELECTRODE EXCISION     h/o  . CERVICAL CONE BIOPSY     h/o  . COLPOSCOPY    . DILATION AND CURETTAGE OF UTERUS  2009  . ENDARTERECTOMY FEMORAL Right 04/16/2017   Procedure: RIGHT FEMORAL ENDARTERECTOMY;  Surgeon: Serafina Mitchell, MD;  Location: [facility];  Service: Vascular;  Laterality: Right;  . FRACTURE SURGERY    . HERNIA REPAIR    . INCISION AND DRAINAGE ABSCESS     "groin"  . INSERTION OF DIALYSIS CATHETER Left 04/16/2017   Procedure: INSERTION of temporay DIALYSIS CATHETER, left femoral artery;  Surgeon: Serafina Mitchell, MD;  Location: [facility];  Service: Vascular;  Laterality: Left;  . IR HYBRID TRAUMA EMBOLIZATION  04/16/2017  . IR PARACENTESIS  06/12/2017  . ORIF ANKLE FRACTURE Right 02/16/2015   Procedure: OPEN REDUCTION INTERNAL FIXATION (ORIF) RIGHT ANKLE FRACTURE;  Surgeon: Altamese Parc, MD;  Location: [facility];  Service: Orthopedics;  Laterality: Right;  . PATCH ANGIOPLASTY Right  04/16/2017   Procedure: PATCH ANGIOPLASTY OF RIGHT FEMORAL ARTERY USING BOVINE PERICARDIUM PATCH;  Surgeon: Serafina Mitchell, MD;  [facility];  Service: Vascular;  Laterality: Right;  . UMBILICAL HERNIA REPAIR  X 2  . WOUND EXPLORATION Right 05/09/2017   Procedure: [place];  Surgeon: Serafina Mitchell, MD;  [facility];  Service: Vascular;  Laterality: Right;    Assessment / Plan / Recommendation Clinical Impression   Chistina E Robertsis a 42 y.o.femalewith histoyr of CAD, ESRD, T2DM, right foot wounds s/p revascularization complicated hospital course 03/2017, sacral decub and RLE ischemia requiring right metatarsal amputation with exploration of right groin wound 04/2017. History taken from chart review. She was discharged to SNF 1/16 and readmitted later that evening due to fall out of bed. CT head revealed hyperdense focus in left posterior parietal region suspicious for small hemorrhagic mass, reviewed.Dr. Christella Noa recommended follow up MRI  but she was unable to lie flat for MRI and can be done on outpatient basis per NS. She was found to have ascites due to fluid overload and underwent paracentesis of 9.8 L fluid.  CT abdomen/pelvis done due to stage IV sacral decub and wound drainage and was positive for sacral osteomyelitis. She was started on IV Vanc and Zosyn--ID following for input. 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 Trula Slade. She continues to have VAC to right groin. Antibiotics discontinued with recommendations to monitor wound over next week as well as pressure relief measures. She is having difficulty sitting for HD due to sacral pain with question of ability to tolerate outpatient HD. CIR recommended due to functional deficits.  SLP evaluation was completed on 06/24/2017 with the following results:  Pt presents with subtle, higher level cognitive deficits characterized by decreased recall of new  information and executive functioning impairment.  Suspect that pt is near her baseline given her reports of her previous level of function; however, pt would benefit from skilled ST 1-3x/week while inpatient in order to maximize functional independence and reduce burden of care prior to discharge.  Do not anticipate ST needs upon discharge from CIR.     Skilled Therapeutic Interventions          Cognitive-linguistic evaluation completed with results and recommendations reviewed with patient.  Given the abovementioned deficits, pt needed min assist to complete functional tasks.  Pt scored 19/30 on the MoCA which is indicative of moderate cognitive impairment; however, she performed much better on functional tasks.  Pt was handed off to PT who assisted therapist in transferring pt to wheelchair via [facility] in anticipation of upcoming PT therapy session.      SLP Assessment  Patient will need skilled [facility] during CIR admission    Recommendations  Recommendations for Other Services: Neuropsych consult Patient destination: Home Follow up Recommendations: None Equipment Recommended: None recommended by SLP    SLP Frequency 1 to 3 out of 7 days   SLP Duration  SLP Intensity  SLP Treatment/Interventions 10-14 days for SLP   Minumum of 1-2 x/day, 30 to 90 minutes  Cognitive remediation/compensation;Cueing hierarchy;Functional tasks;Patient/family education;Internal/external aids;Environmental controls    Pain Pain Assessment Pain Assessment: 0-10 Pain Score: 7  Pain Location: Buttocks Pain Descriptors / Indicators: Aching Pain Intervention(s): Repositioned  Prior Functioning Cognitive/Linguistic Baseline: Baseline deficits Baseline deficit details: pt reports chronic memory deficits correlating to prolonged dialysis  Type of Home: House  Lives With: Spouse;Daughter Available Help at Discharge: Family;Available PRN/intermittently Education: 12th grade   Vocation: Unemployed  Function:  Eating Eating                 Cognition Comprehension Comprehension assist level: Understands complex 90% of the time/cues 10% of the time  Expression   Expression assist level: Expresses complex ideas: With extra time/assistive device  Social Interaction Social Interaction assist level: Interacts appropriately 90% of the time - Needs monitoring or encouragement for participation or interaction.  Problem Solving Problem solving assist level: Solves basic 90% of the time/requires cueing < 10% of the time  Memory Memory assist level: Recognizes or recalls 75 - 89% of the time/requires cueing 10 - 24% of the time   Short Term Goals: Week 1: SLP Short Term Goal 1 (Week 1): Pt will utilize external aids to facilitate recall of daily information with mod I.  SLP Short Term Goal 2 (Week  1): Pt will complete semi-complex tasks with mod I for functional problem solving.    Refer to Care Plan for Long Term Goals  Recommendations for other services: Neuropsych  Discharge Criteria: Patient will be discharged from SLP if patient refuses treatment 3 consecutive times without medical reason, if treatment goals not met, if there is a change in medical status, if patient makes no progress towards goals or if patient is discharged from hospital.  The above assessment, treatment plan, treatment alternatives and goals were discussed and mutually agreed upon: by patient  Emilio Math 06/24/2017, 3:47 PM

## 2017-06-24 NOTE — Evaluation (Signed)
Physical Therapy Assessment and Plan  Patient Details  Name: [name] MRN: [id number] Date of Birth: [birth year]  PT Diagnosis: Difficulty walking, Impaired sensation and Muscle weakness Rehab Potential: Fair ELOS: 24-28 days   Today's Date: 06/24/2017 PT Individual Time: 6712-4580 PT Individual Time Calculation (min): 45 min    Problem List:  Patient Active Problem List   Diagnosis Date Noted  . Acute blood loss anemia   . Anemia of chronic disease   . ESRD on dialysis ([place])   . Benign essential HTN   . Diabetes mellitus type 2 in nonobese (HCC)   . Unilateral complete BKA, right, initial encounter ([place]) 06/23/2017  . Post-operative pain   . Unilateral complete BKA, right, sequela ([place])   . Stage IV pressure ulcer of sacral region ([place]) 06/16/2017  . Wound healing, delayed   . Sacral osteomyelitis ([place])   . Chronic renal failure   . Brain mass 06/11/2017  . Fall 06/11/2017  . 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 History of ETT   . Pressure injury of skin 04/22/2017  . Acute respiratory failure with hypoxia ([place])   . Cardiac arrest, cause unspecified ([place])   . Non-healing ulcer ([place])   . Hypertension 03/24/2017  . Hypertensive urgency 03/24/2017  . Cellulitis in diabetic foot ([place]) 03/24/2017  . Cellulitis 03/24/2017  . Chronic pain 03/24/2017  . Diabetic ulcer of lower leg ([place]) 03/24/2017  . Gangrene of toe of right foot ([place])   . Acute on chronic diastolic CHF (congestive heart failure) ([place]) 02/22/2016  . Fluid overload   . Pericardial effusion   . Ascites 02/15/2016  . Uncontrolled diabetes mellitus with end-stage renal disease ([place]) 02/16/2015  . Bimalleolar ankle fracture 02/12/2015  . CAD (coronary artery disease) 02/09/2015  . PAD (peripheral artery disease) ([place])   . Gastroparesis   . Chronic anemia   . Menorrhagia with regular cycle 01/04/2014  . Dysmenorrhea 01/04/2014  . Nausea & vomiting 10/16/2011  . History  of noncompliance with medical treatment 05/09/2011  . Chronic UTI 05/09/2011  . CIN III (cervical intraepithelial neoplasia grade III) with severe dysplasia   . History of abscesses in groins 12/06/2010  . End stage renal disease on dialysis ([place])   . Coronary artery disease   . Hyperlipidemia   . 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 NONDEPENDENT TOBACCO USE DISORDER 12/20/2009  . Essential hypertension, benign 12/20/2009  . NEPHROTIC SYNDROME 12/20/2009    Past Medical History:  Past Medical History:  Diagnosis Date  . Arthritis   . CHF (congestive heart failure) ([place])   . Chronic anemia    2nd to renal disease  . CIN III (cervical intraepithelial neoplasia grade III) with severe dysplasia    S/P LEEP AND CONE  . Coronary artery disease    Status post cardiac catheterization June 2012 scattered coronary artery disease/atherosclerosis with 70-80% stenosis in a small right PDA.  . 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 DM (diabetes mellitus) ([place])    Long-term insulin  . End stage renal disease on dialysis [facility]) 05/02/11   "[facility]"; [facility]; M; W, F ("got it 04/15/2017 because of Thanksgiving")  . Fracture of 5th metatarsal 2016   Right  . Gastroparesis   . Heart murmur   . 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 History of abscesses in groins 12/06/2010  . Hyperlipidemia    Hypertriglyceridemia 449 HDL 25  . Hypertension   . Migraines    "just on  dialysis days"  . Peripheral neuropathy    related to DM  . Peripheral vascular disease ([place])    Tibial occlusive disease evaluated by Dr. Kellie Simmering in August 2011. Medical therapy  . Renal insufficiency    Dialysis since 2012  . Seizures ([place])    "only when her sugar drops" (04/17/2017)  . Tobacco use disorder    Discontinued March 2012  . Trimalleolar fracture of ankle, closed 02/09/2015   Right   Past Surgical History:  Past Surgical History:  Procedure Laterality Date  . ABDOMINAL AORTOGRAM W/LOWER EXTREMITY [name] 03/27/2017    Procedure: ABDOMINAL AORTOGRAM W/LOWER EXTREMITY;  Surgeon: Conrad Irving, MD;  Location: [place] [facility];  Service: Cardiovascular;  Laterality: [name];  . AMPUTATION Right 05/09/2017   Procedure: AMPUTATION TRANSMETATARSAL OF RIGHT FOOT;  Surgeon: Serafina Mitchell, MD;  Location: [facility];  Service: Vascular;  Laterality: Right;  . AMPUTATION Right 06/17/2017   Procedure: AMPUTATION BELOW KNEE RIGHT;  Surgeon: Serafina Mitchell, MD;  [facility];  Service: Vascular;  Laterality: Right;  . AV FISTULA PLACEMENT Left 04/2010   "lower arm"  . CARDIAC CATHETERIZATION [name] 02/10/2015   Procedure: Left Heart Cath and Coronary Angiography;  Surgeon: Dixie Dials, MD;  Location: [place] [facility];  Service: Cardiovascular;  Laterality: [name];  . CARDIAC CATHETERIZATION [name] 02/21/2016   Procedure: Right Heart Cath;  Surgeon: Jolaine Artist, MD;  Location: [place] [facility];  Service: Cardiovascular;  Laterality: [name];  . CATARACT EXTRACTION W/ INTRAOCULAR LENS  IMPLANT, BILATERAL Bilateral   . CERVICAL BIOPSY  W/ LOOP ELECTRODE EXCISION     h/o  . CERVICAL CONE BIOPSY     h/o  . COLPOSCOPY    . DILATION AND CURETTAGE OF UTERUS  2009  . ENDARTERECTOMY FEMORAL Right 04/16/2017   Procedure: RIGHT FEMORAL ENDARTERECTOMY;  Surgeon: Serafina Mitchell, MD;  Location: [facility];  Service: Vascular;  Laterality: Right;  . FRACTURE SURGERY    . HERNIA REPAIR    . INCISION AND DRAINAGE ABSCESS     "groin"  . INSERTION OF DIALYSIS CATHETER Left 04/16/2017   Procedure: INSERTION of temporay DIALYSIS CATHETER, left femoral artery;  Surgeon: Serafina Mitchell, MD;  Location: [facility];  Service: Vascular;  Laterality: Left;  . IR HYBRID TRAUMA EMBOLIZATION  04/16/2017  . IR PARACENTESIS  06/12/2017  . ORIF ANKLE FRACTURE Right 02/16/2015   Procedure: OPEN REDUCTION INTERNAL FIXATION (ORIF) RIGHT ANKLE FRACTURE;  Surgeon: Altamese [place], MD;  Location: [place];  Service: Orthopedics;  Laterality: Right;  . PATCH ANGIOPLASTY  Right 04/16/2017   Procedure: PATCH ANGIOPLASTY OF RIGHT FEMORAL ARTERY USING BOVINE PERICARDIUM PATCH;  Surgeon: Serafina Mitchell, MD;  [facility];  Service: Vascular;  Laterality: Right;  . UMBILICAL HERNIA REPAIR  X 2  . WOUND EXPLORATION Right 05/09/2017   Procedure: EXPLORATION RIGHT GROIN;  Surgeon: Serafina Mitchell, MD;  [facility];  Service: Vascular;  Laterality: Right;    Assessment & Plan Clinical Impression: Patient is a 42 y.o. year old female with recent admission to the hospital on 03/2017, sacral decub and RLE ischemia requiring right metatarsal amputation with exploration of right groin wound 04/2017. She was discharged to SNF 1/16 and readmitted later that evening due to fall out of bed. CT head revealed hyperdense focus in left posterior parietal region suspicious for small hemorrhagic mass, reviewed.Dr. Christella Noa recommended follow up MRI but she was unable to lie flat for MRI and can be  done on outpatient basis per NS. She was found to have ascites due to fluid overload and underwent paracentesis of 9.8 L fluid.   CT abdomen/pelvis done due to stage IV sacral decub and wound drainage and was positive for sacral osteomyelitis. She was started on IV Vanc and Zosyn--ID following for input. 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 Trula Slade. She continues to have VAC to right groin.  Patient transferred to CIR on 06/23/2017 .   Patient currently requires total with mobility secondary to muscle weakness and decreased sitting balance, decreased standing balance, decreased postural control and decreased balance strategies.  Prior to hospitalization, patient was modified independent  with mobility and lived with Spouse, Daughter in a House home.  Home access is  Level entry.  Patient will benefit from skilled PT intervention to maximize safe functional mobility, minimize fall risk and decrease caregiver burden for planned  discharge home with 24 hour assist.  Anticipate patient will benefit from follow up [facility] at discharge.  PT - End of Session Activity Tolerance: Tolerates 30+ min activity with multiple rests Endurance Deficit: Yes PT Assessment Rehab Potential (ACUTE/IP ONLY): Fair PT Barriers to Discharge: Medical stability;Wound Care;Hemodialysis;Lack of/limited family support PT Patient demonstrates impairments in the following area(s): Balance;Endurance;Motor;Pain;Sensory;Skin Integrity PT Transfers Functional Problem(s): Furniture;Car;Bed to Chair;Bed Mobility PT Locomotion Functional Problem(s): Ambulation;Wheelchair Mobility PT Plan PT Intensity: Minimum of 1-2 x/day ,45 to 90 minutes PT Frequency: Total of 15 hours over 7 days of combined therapies PT Duration Estimated Length of Stay: 24-28 days PT Treatment/Interventions: Ambulation/gait training;Community reintegration;DME/adaptive equipment instruction;Stair training;Neuromuscular re-education;UE/LE Strength taining/ROM;Wheelchair propulsion/positioning;UE/LE Coordination activities;Therapeutic Activities;Pain management;Discharge planning;Balance/vestibular training;Cognitive remediation/compensation;Functional mobility training;Patient/family education;Splinting/orthotics;Therapeutic Exercise PT Transfers Anticipated Outcome(s): min A PT Locomotion Anticipated Outcome(s): supervision w/c level PT Recommendation Recommendations for Other Services: Neuropsych consult Follow Up Recommendations: Home health PT;24 hour supervision/assistance Patient destination: Home Equipment Recommended: To be determined  Skilled Therapeutic Intervention Pt able to participate in limited PT eval. Pt limited by pain and decreased activity tolerance. Pt rec'd pain meds prior to session.  Pt performed bed mobility rolling and supine <> sit with mod/max A.  Sitting edge of bed with min A with decreased sitting tolerance.  Sit to stand x 2 attempts with +2 assist 3  musketeers style with pt unable to fully extend Lt knee or trunk during standing.  Pt requires +2 assist to scoot backwards/fwds on the bed.  Pt refuses to attempt to sit up in chair at this time, states she will try to sit up again this afternoon.  Pt missed final 45 minutes of PT session due to fatigue and pain.  PT Evaluation Precautions/Restrictions Precautions Precautions: Fall Precaution Comments: wound vac from groin, sacral decubital wound  Restrictions Weight Bearing Restrictions: Yes RLE Weight Bearing: Non weight bearing Pain Pain Assessment Faces Pain Scale: Hurts even more Pain Type: Chronic pain Pain Location: Buttocks Pain Orientation: Medial Pain Descriptors / Indicators: Aching;Burning Pain Onset: On-going Pain Intervention(s): RN made aware;Repositioned Home Living/Prior Functioning Home Living Available Help at Discharge: Family;Available PRN/intermittently Type of Home: House Home Access: Level entry Home Layout: One level Bathroom Shower/Tub: Tub/shower unit Additional Comments: pt's daughter reported that pt has been in the hospital/at [facility] for the past two months and was at a SNF for less than 24 hours prior to this admission.  Lives With: Spouse;Daughter Prior Function Level of Independence: Independent with transfers;Independent with basic ADLs Comments: pt reported that she was using  w/c for mobility and could perform transfers independently. pt also stated that she was independent with ADLs. pt's daughter reported that pt was still ambulating two months ago. Vision/Perception  Perception Perception: Within Functional Limits Praxis Praxis: Intact  Cognition Overall Cognitive Status: Within Functional Limits for tasks assessed Arousal/Alertness: Awake/alert Orientation Level: Oriented X4 Attention: Sustained Sustained Attention: Appears intact Memory: Appears intact Awareness: Appears intact Safety/Judgment: Appears  intact Sensation Sensation Light Touch: (hypersensitive Rt LE) Stereognosis: Appears Intact Proprioception: Appears Intact Coordination Gross Motor Movements are Fluid and Coordinated: No Fine Motor Movements are Fluid and Coordinated: No Coordination and Movement Description: difficulty using bed controls due to weakness and tremors, bilat UE and LE tremors Motor  Motor Motor - Skilled Clinical Observations: generalized weakness; acute pain   Trunk/Postural Assessment  Cervical Assessment Cervical Assessment: Within Functional Limits Thoracic Assessment Thoracic Assessment: Within Functional Limits Lumbar Assessment Lumbar Assessment: Within Functional Limits Postural Control Postural Control: Deficits on evaluation Trunk Control: requires min A and UB support at EOB  Balance Dynamic Sitting Balance Sitting balance - Comments: min A for sitting EOB without LE support Static Standing Balance Static Standing - Comment/# of Minutes: +2 assist using 3 musketeers, unable to fully extend Lt hip and knee Extremity Assessment  RUE Assessment RUE Assessment: Within Functional Limits(generalized weakness) LUE Assessment LUE Assessment: Within Functional Limits(generalized weakness) RLE Assessment RLE Assessment: (unable to achieve full knee extension, lacking 30 degrees) LLE Assessment LLE Assessment: (grossly 3-/5, DF trace)   See Function Navigator for Current Functional Status.   Refer to Care Plan for Long Term Goals  Recommendations for other services: Neuropsych  Discharge Criteria: Patient will be discharged from PT if patient refuses treatment 3 consecutive times without medical reason, if treatment goals not met, if there is a change in medical status, if patient makes no progress towards goals or if patient is discharged from hospital.  The above assessment, treatment plan, treatment alternatives and goals were discussed and mutually agreed upon: by  patient  Donte Lenzo 06/24/2017, 11:26 AM

## 2017-06-24 NOTE — Procedures (Signed)
PROCEDURE SUMMARY:  Successful US guided paracentesis from left lateral abdomen.  Yielded 2.0 liters of clear, yellow fluid.  No immediate complications.  Pt did not tolerate procedure well due to pain and inability to position/reposition easily and comfortably.  Procedure was stopped prior to removal of all fluid at patient's request.   Specimen was sent for labs.  Docia Barrier PA-C 06/24/2017 3:58 PM

## 2017-06-24 NOTE — Progress Notes (Signed)
Pharmacy Antibiotic Note  [name] is a 42 y.o. female with recent complicated hospital course 03/2017 discharged to SNF 1/16, but readmitted on the same day after fall out of bed and had possible small hemorrhagic mass on CT. She was being treated with vanc/zosyn for sacral osteo (1/17-1/23). And s/p RBKA on 1/22, and transferred to CIR on 1/28. Pharmacy is consulted to restart abx with vanc/fortaz per Dr. Johnnye Sima recommendation. Plan to treat for 6 weeks. She is afebrile, wbc wnl, current weight 70 kg. HD MWF, next HD 1/30  Plan: Goal trough 15-20 mcg/mL Vancomycin 1500 mg loading dose Vancomycin 750 mg IV Q HD next dose tomorrow Fortaz 1g IV Q 24 hrs F/u HD schedule Vancomycin pre-HD level after 3 doses.   Height: 6\' 4"  (193 cm) Weight: 154 lb 5.2 oz (70 kg) IBW/kg (Calculated) : 82.3  Temp (24hrs), Avg:98.3 F (36.8 C), Min:98.2 F (36.8 C), Max:98.3 F (36.8 C)  Recent Labs  Lab 06/18/17 0612 06/19/17 0707 06/20/17 0754 06/20/17 0755 06/23/17 0708  WBC 11.7* 11.4* 11.1*  --  9.9  CREATININE 5.70* 4.07*  --  5.57* 6.20*    Estimated Creatinine Clearance: 13.2 mL/min (A) (by C-G formula based on SCr of 6.2 mg/dL (H)).    Allergies  Allergen Reactions  . Cephalexin Other (See Comments)    Reaction unknown- Childhood allergy Tolerated Ceftriaxone in the past  . Sulfamethoxazole-Trimethoprim Other (See Comments)    Unknown reaction. Pt states that she was told by her mother that she had allergy to Bactrim as a child.    Antimicrobials this admission: Tressie Ellis 1/29 >> Vancomycin 1/17>>1/23, 1/29 >> Zosyn 1/17>>1/23  Dose adjustments this admission:  Microbiology results: 1/17 BCx: neg 1/17 Peritoneal fluid: neg 1/17 MRSA PCR: negative 1/21 surgical PCR: MRSA neg, s.aureus pos  Thank you for allowing pharmacy to be a part of this patient's care.  Maryanna Shape, PharmD, BCPS  Clinical Pharmacist  Pager: [phone]   06/24/2017 1:05 PM

## 2017-06-24 NOTE — Evaluation (Signed)
Recreational Therapy Assessment and Plan  Patient Details  Name: [name] MRN: [id number] Date of Birth: 26-Dec-1975 Today's Date: 06/24/2017  Rehab Potential:   ELOS:     Assessment  Problem List:      Patient Active Problem List   Diagnosis Date Noted  . Acute blood loss anemia   . Anemia of chronic disease   . ESRD on dialysis ([place])   . Benign essential HTN   . Diabetes mellitus type 2 in nonobese (HCC)   . Unilateral complete BKA, right, initial encounter ([place]) 06/23/2017  . Post-operative pain   . Unilateral complete BKA, right, sequela ([place])   . Stage IV pressure ulcer of sacral region ([place]) 06/16/2017  . Wound healing, delayed   . Sacral osteomyelitis ([place])   . Chronic renal failure   . Brain mass 06/11/2017  . Fall 06/11/2017  . 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 History of ETT   . Pressure injury of skin 04/22/2017  . Acute respiratory failure with hypoxia ([place])   . Cardiac arrest, cause unspecified ([place])   . Non-healing ulcer ([place])   . Hypertension 03/24/2017  . Hypertensive urgency 03/24/2017  . Cellulitis in diabetic foot ([place]) 03/24/2017  . Cellulitis 03/24/2017  . Chronic pain 03/24/2017  . Diabetic ulcer of lower leg ([place]) 03/24/2017  . Gangrene of toe of right foot ([place])   . Acute on chronic diastolic CHF (congestive heart failure) ([place]) 02/22/2016  . Fluid overload   . Pericardial effusion   . Ascites 02/15/2016  . Uncontrolled diabetes mellitus with end-stage renal disease ([place]) 02/16/2015  . Bimalleolar ankle fracture 02/12/2015  . CAD (coronary artery disease) 02/09/2015  . PAD (peripheral artery disease) ([place])   . Gastroparesis   . Chronic anemia   . Menorrhagia with regular cycle 01/04/2014  . Dysmenorrhea 01/04/2014  . Nausea & vomiting 10/16/2011  . History of noncompliance with medical treatment 05/09/2011  . Chronic UTI 05/09/2011  . CIN III (cervical intraepithelial  neoplasia grade III) with severe dysplasia   . History of abscesses in groins 12/06/2010  . End stage renal disease on dialysis ([place])   . Coronary artery disease   . Hyperlipidemia   . 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 NONDEPENDENT TOBACCO USE DISORDER 12/20/2009  . Essential hypertension, benign 12/20/2009  . NEPHROTIC SYNDROME 12/20/2009    Past Medical History:      Past Medical History:  Diagnosis Date  . Arthritis   . CHF (congestive heart failure) ([place])   . Chronic anemia    2nd to renal disease  . CIN III (cervical intraepithelial neoplasia grade III) with severe dysplasia    S/P LEEP AND CONE  . Coronary artery disease    Status post cardiac catheterization June 2012 scattered coronary artery disease/atherosclerosis with 70-80% stenosis in a small right PDA.  . Diabetic Charcot's joint disease ([place])   . DM (diabetes mellitus) ([place])    Long-term insulin  . End stage renal disease on dialysis [facility]) 05/02/11   "[facility]"; [facility]; M; W, F ("got it 04/15/2017 because of Thanksgiving")  . Fracture of 5th metatarsal 2016   Right  . Gastroparesis   . Heart murmur   . 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 History of abscesses in groins 12/06/2010  . Hyperlipidemia    Hypertriglyceridemia 449 HDL 25  . Hypertension   . Migraines    "just on dialysis days"  . Peripheral neuropathy    related to DM  .  Peripheral vascular disease ([place])    Tibial occlusive disease evaluated by Dr. Kellie Simmering in August 2011. Medical therapy  . Renal insufficiency    Dialysis since 2012  . Seizures ([place])    "only when her sugar drops" (04/17/2017)  . Tobacco use disorder    Discontinued March 2012  . Trimalleolar fracture of ankle, closed 02/09/2015   Right   Past Surgical History:       Past Surgical History:  Procedure Laterality Date  . ABDOMINAL AORTOGRAM W/LOWER EXTREMITY [name] 03/27/2017   Procedure: ABDOMINAL AORTOGRAM W/LOWER EXTREMITY;  Surgeon: Conrad Campbell, MD;  Location: [facility] [facility];  Service: Cardiovascular;  Laterality: [name];  . AMPUTATION Right 05/09/2017   Procedure: AMPUTATION TRANSMETATARSAL OF RIGHT FOOT;  Surgeon: Serafina Mitchell, MD;  Location: [facility];  Service: Vascular;  Laterality: Right;  . AMPUTATION Right 06/17/2017   Procedure: AMPUTATION BELOW KNEE RIGHT;  Surgeon: Serafina Mitchell, MD;  [facility];  Service: Vascular;  Laterality: Right;  . AV FISTULA PLACEMENT Left 04/2010   "lower arm"  . CARDIAC CATHETERIZATION [name] 02/10/2015   Procedure: Left Heart Cath and Coronary Angiography;  Surgeon: Dixie Dials, MD;  Location: [place] [facility];  Service: Cardiovascular;  Laterality: [name];  . CARDIAC CATHETERIZATION [name] 02/21/2016   Procedure: Right Heart Cath;  Surgeon: Jolaine Artist, MD;  Location: [place] [facility];  Service: Cardiovascular;  Laterality: [name];  . CATARACT EXTRACTION W/ INTRAOCULAR LENS  IMPLANT, BILATERAL Bilateral   . CERVICAL BIOPSY  W/ LOOP ELECTRODE EXCISION     h/o  . CERVICAL CONE BIOPSY     h/o  . COLPOSCOPY    . DILATION AND CURETTAGE OF UTERUS  2009  . ENDARTERECTOMY FEMORAL Right 04/16/2017   Procedure: RIGHT FEMORAL ENDARTERECTOMY;  Surgeon: Serafina Mitchell, MD;  Location: [place] [facility];  Service: Vascular;  Laterality: Right;  . FRACTURE SURGERY    . HERNIA REPAIR    . INCISION AND DRAINAGE ABSCESS     "groin"  . INSERTION OF DIALYSIS CATHETER Left 04/16/2017   Procedure: INSERTION of temporay DIALYSIS CATHETER, left femoral artery;  Surgeon: Serafina Mitchell, MD;  Location: [facility];  Service: Vascular;  Laterality: Left;  . IR HYBRID TRAUMA EMBOLIZATION  04/16/2017  . IR PARACENTESIS  06/12/2017  . ORIF ANKLE FRACTURE Right 02/16/2015   Procedure: OPEN REDUCTION INTERNAL FIXATION (ORIF) RIGHT ANKLE FRACTURE;  Surgeon: Altamese Webster, MD;  Location: [place];  Service: Orthopedics;  Laterality: Right;  . PATCH ANGIOPLASTY Right 04/16/2017   Procedure: PATCH  ANGIOPLASTY OF RIGHT FEMORAL ARTERY USING BOVINE PERICARDIUM PATCH;  Surgeon: Serafina Mitchell, MD;  [facility];  Service: Vascular;  Laterality: Right;  . UMBILICAL HERNIA REPAIR  X 2  . WOUND EXPLORATION Right 05/09/2017   Procedure: EXPLORATION RIGHT GROIN;  Surgeon: Serafina Mitchell, MD;  [facility];  Service: Vascular;  Laterality: Right;    Assessment & Plan Clinical Impression: Patient is a 42 y.o. year old female with recent admission to the hospital on 03/2017, sacral decub and RLE ischemia requiring right metatarsal amputation with exploration of right groin wound 04/2017. She was discharged to SNF 1/16 and readmitted later that evening due to fall out of bed. CT head revealed hyperdense focus in left posterior parietal region suspicious for small hemorrhagic mass, reviewed.Dr. Christella Noa recommended follow up MRI but she was unable to lie flat for MRI and can be done on outpatient basis per NS. She was found  to have ascites due to fluid overload and underwent paracentesis of 9.8 L fluid. CT abdomen/pelvis done due to stage IV sacral decub and wound drainage and was positive for sacral osteomyelitis. She was started on IV Vanc and Zosyn--ID following for input. 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 Trula Slade. She continues to have VAC to right groin.Patient transferred to CIR on 06/23/2017 .   Pt presents with decreased activity tolerance, decreased functional mobility, decreased balance limiting pt's independence with leisure/community pursuits.   Leisure History/Participation Premorbid leisure interest/current participation: Petra Kuba - Other (Comment) Other Leisure Interests: Reading Leisure Participation Style: With Family/Friends Psychosocial / Spiritual Patient agreeable to Pet Therapy: Yes Does patient have pets?: Yes(dog) Social interaction - Mood/Behavior: Cooperative Academic librarian Appropriate for  Education?: Yes Recreational Therapy Orientation Orientation -Reviewed with patient: Available activity resources Strengths/Weaknesses Patient weaknesses: Physical limitations TR Patient demonstrates impairments in the following area(s): Edema;Endurance;Motor;Pain;Skin Integrity  Plan  Min 1 TR session/group >20 minutes during LOS Recommendations for other services: Neuropsych  Discharge Criteria: Patient will be discharged from TR if patient refuses treatment 3 consecutive times without medical reason.  If treatment goals not met, if there is a change in medical status, if patient makes no progress towards goals or if patient is discharged from hospital.  The above assessment, treatment plan, treatment alternatives and goals were discussed and mutually agreed upon: by patient  [place] 06/24/2017, 11:46 AM

## 2017-06-24 NOTE — Progress Notes (Addendum)
Discussed patient with Dr. Johnnye Sima. Relayed no bone biopsy or cultures taken prior to initiating antibiotics. He felt that bone biopsy an option but likely will not be predictive as she has had antibiotics. He recommended Vanc/Ceftazidime for 6 weeks to treat osteomyelitis.

## 2017-06-25 ENCOUNTER — Encounter (HOSPITAL_COMMUNITY): Payer: Self-pay | Admitting: Student

## 2017-06-25 ENCOUNTER — Inpatient Hospital Stay (HOSPITAL_COMMUNITY): Payer: Medicare Other | Admitting: Occupational Therapy

## 2017-06-25 ENCOUNTER — Inpatient Hospital Stay (HOSPITAL_COMMUNITY): Payer: Medicare Other | Admitting: Physical Therapy

## 2017-06-25 ENCOUNTER — Other Ambulatory Visit: Payer: Self-pay

## 2017-06-25 ENCOUNTER — Inpatient Hospital Stay (HOSPITAL_COMMUNITY): Payer: Medicare Other | Admitting: Speech Pathology

## 2017-06-25 DIAGNOSIS — R52 Pain, unspecified: Secondary | ICD-10-CM | POA: Insufficient documentation

## 2017-06-25 DIAGNOSIS — E1169 Type 2 diabetes mellitus with other specified complication: Secondary | ICD-10-CM

## 2017-06-25 DIAGNOSIS — M869 Osteomyelitis, unspecified: Secondary | ICD-10-CM

## 2017-06-25 LAB — GRAM STAIN

## 2017-06-25 LAB — CBC
HCT: 24.9 % — ABNORMAL LOW (ref 36.0–46.0)
Hemoglobin: 7.7 g/dL — ABNORMAL LOW (ref 12.0–15.0)
MCH: 28.2 pg (ref 26.0–34.0)
MCHC: 30.9 g/dL (ref 30.0–36.0)
MCV: 91.2 fL (ref 78.0–100.0)
Platelets: 265 10*3/uL (ref 150–400)
RBC: 2.73 MIL/uL — ABNORMAL LOW (ref 3.87–5.11)
RDW: 17.4 % — ABNORMAL HIGH (ref 11.5–15.5)
WBC: 11.9 10*3/uL — ABNORMAL HIGH (ref 4.0–10.5)

## 2017-06-25 LAB — PATHOLOGIST SMEAR REVIEW

## 2017-06-25 LAB — RENAL FUNCTION PANEL
Albumin: 1.6 g/dL — ABNORMAL LOW (ref 3.5–5.0)
Anion gap: 14 (ref 5–15)
BUN: 46 mg/dL — ABNORMAL HIGH (ref 6–20)
CO2: 26 mmol/L (ref 22–32)
Calcium: 7.8 mg/dL — ABNORMAL LOW (ref 8.9–10.3)
Chloride: 94 mmol/L — ABNORMAL LOW (ref 101–111)
Creatinine, Ser: 5.66 mg/dL — ABNORMAL HIGH (ref 0.44–1.00)
GFR calc Af Amer: 10 mL/min — ABNORMAL LOW (ref >60)
GFR calc non Af Amer: 8 mL/min — ABNORMAL LOW (ref >60)
Glucose, Bld: 263 mg/dL — ABNORMAL HIGH (ref 65–99)
Phosphorus: 4.6 mg/dL (ref 2.5–4.6)
Potassium: 4.4 mmol/L (ref 3.5–5.1)
Sodium: 134 mmol/L — ABNORMAL LOW (ref 135–145)

## 2017-06-25 LAB — GLUCOSE, CAPILLARY
Glucose-Capillary: 195 mg/dL — ABNORMAL HIGH (ref 65–99)
Glucose-Capillary: 274 mg/dL — ABNORMAL HIGH (ref 65–99)
Glucose-Capillary: 210 mg/dL — ABNORMAL HIGH (ref 65–99)

## 2017-06-25 MED ORDER — LORAZEPAM 0.5 MG PO TABS
0.5000 mg | ORAL_TABLET | Freq: Four times a day (QID) | ORAL | Status: DC | PRN
  Administered 2017-06-25 – 2017-07-09 (×5): 0.5 mg via ORAL
  Filled 2017-06-25 (×3): qty 1

## 2017-06-25 MED ORDER — HYDRALAZINE HCL 10 MG PO TABS
10.0000 mg | ORAL_TABLET | Freq: Three times a day (TID) | ORAL | Status: DC
  Administered 2017-06-25 – 2017-06-26 (×4): 10 mg via ORAL
  Filled 2017-06-25 (×4): qty 1

## 2017-06-25 MED ORDER — VANCOMYCIN HCL IN DEXTROSE 750-5 MG/150ML-% IV SOLN
INTRAVENOUS | Status: AC
  Filled 2017-06-25: qty 150

## 2017-06-25 MED ORDER — LORAZEPAM 0.5 MG PO TABS
ORAL_TABLET | ORAL | Status: AC
Start: 2017-06-25 — End: 2017-06-26
  Filled 2017-06-25: qty 1

## 2017-06-25 MED ORDER — LORAZEPAM 0.5 MG PO TABS
ORAL_TABLET | ORAL | Status: AC
  Filled 2017-06-25: qty 2

## 2017-06-25 NOTE — Progress Notes (Signed)
Occupational Therapy Session Note  Patient Details  Name: [name] MRN: [id number] Date of Birth: 1975-06-09  Today's Date: 06/25/2017 OT Individual Time: 4656-8127 OT Individual Time Calculation (min): 68 min    Short Term Goals: Week 1:  OT Short Term Goal 1 (Week 1): Pt will tolerate sitting EOB for 10 min to engage in sitting bathing and dressing. OT Short Term Goal 2 (Week 1): Pt will be able to loop pants over feet with min A. OT Short Term Goal 3 (Week 1): Pt will be able to pull pants over hips with lateral leans with min A. OT Short Term Goal 4 (Week 1): Pt will tolerate standing in [place] lift for 2-3 minutes at a time to develop standing tolerance.  OT Short Term Goal 5 (Week 1): Pt will be able to tolerate sitting on a padded BSC for 5-10 min.   Skilled Therapeutic Interventions/Progress Updates:    Pt received in bed and discussed how her husband assisted her with getting ready this morning. She stated she bathed herself with set up from the bed except for her legs which her husband helped with.  She donned her bra and shirt herself and her husband helped her with her pants.   Pt stated she was certain she would not be able to tolerate sitting on a BSC, even a padded one for right now but maybe in a few days.  She worked on rolling in bed with mod A.  She rolled onto R side and worked on sidelying to sit with min -mod A.  Tolerated sitting on air mattress at EOB for 3-5 minutes. Attempted sit to stand with +2 to RW, but pt could not lift hips.  Pt layed back down to rest for a few minutes.  Obtained a Biochemist, clinical.  Pt was able to tolerate coming into a full stand with full L knee extension in [place] for 1 minute, 2 min rest break and then stood again for 1 min.  Pt sat on bed and RN in room to provide medication. RN assisted pt back to sidelying.   Therapy Documentation Precautions:  Precautions Precautions: Fall Precaution Comments: wound vac from groin, sacral  decubital wound  Restrictions Weight Bearing Restrictions: Yes RLE Weight Bearing: Non weight bearing  Pain: Pain Assessment Pain Assessment: 0-10 Pain Score: 4  Pain Intervention(s): Medication (See eMAR) ADL: ADL ADL Comments: see functional naviagtor  See Function Navigator for Current Functional Status.   Therapy/Group: Individual Therapy  [place] 06/25/2017, 10:13 AM

## 2017-06-25 NOTE — Progress Notes (Signed)
Physical Therapy Note  Patient Details  Name: MARISUE CANION MRN: [id number] Date of Birth: 12-17-1975 Today's Date: 06/25/2017    Time: 1030-1125 55 minutes  1:1 Pt c/o phantom pains in Rt LE.  PT educated and demo'd desensitization and encouraged pt to perform throughout the day.  Supine to sit with mod A.  Pt able to tolerate sitting edge of bed x 25 minutes with close supervision.  Standing in [place] 3 x 1 minute with improved hip and knee extension.  Clarise Cruz transfer to w/c.  UBE 2 min fwd/2 min bkwd followed by 60min fwd/1 min bkwd.  Pt requires frequent rests during UBE due to fatigue but is encouraged that she is out of the room for treatment.  Pt positioned in w/c with encouragement to work on Rt knee extension as tolerated.   Cullen Vanallen 06/25/2017, 11:26 AM

## 2017-06-25 NOTE — Progress Notes (Signed)
Right groin wound healing well. Keeps R-BKA flexed at knee with spasticity with extension. Educated patient on importance of extension bilateral Knees to prevent contractures and help with mobility in the long run. R-BKA incision intact with couple of abrasions laterally and evolving hematoma.

## 2017-06-25 NOTE — Progress Notes (Signed)
Pain medication to be given to aid with therapy, use only for getting up and doing therapy. Not to be given for going to dialysis routinely

## 2017-06-25 NOTE — Progress Notes (Signed)
[name] Progress Note   Subjective:     Back on ATB's for sacral osteo 6 week course Vanco and fortaz recommended Paracentesis yesterday only 2L (had to lie on back for procedure and could not tolerate) Working with PT this morning Says could not sit in chair for HD   Objective Vitals:   06/24/17 0208 06/24/17 1422 06/24/17 2121 06/25/17 0510  BP: (!) 157/67 (!) 181/72 (!) 167/68 (!) 163/58  Pulse: 77 69  71  Resp: 16 20  18   Temp: 98.3 F (36.8 C) 98 F (36.7 C)  97.9 F (36.6 C)  TempSrc:    Oral  SpO2: 99% 97%  95%  Weight:      Height:       Physical Exam Lying in bed on left side - getting ready to work with PT Frail and almost skeletal in appearance Small mild L facial droop Regular S1S2 No S3 Lungs clear Abd less tight tho still sig ascites + BS. Not tender R groin with wound VAC in place R BKA No LLE edema Sacral wounds covered Marked muscle wasting throughout Dialysis Access: left AVF + bruit   Additional Objective  Recent Labs  Lab 06/19/17 0707 06/20/17 0755 06/23/17 0708  NA 133* 133* 134*  K 4.5 4.9 4.9  CL 93* 92* 92*  CO2 26 24 25   GLUCOSE 176* 181* 94  BUN 26* 48* 53*  CREATININE 4.07* 5.57* 6.20*  CALCIUM 8.6* 8.7* 8.6*  PHOS  --  8.0* 7.4*    Recent Labs  Lab 06/20/17 0755 06/23/17 0708  ALBUMIN 1.7* 1.6*    Recent Labs  Lab 06/19/17 0707 06/20/17 0754 06/23/17 0708  WBC 11.4* 11.1* 9.9  HGB 8.5* 7.7* 7.7*  HCT 27.6* 24.1* 25.1*  MCV 90.5 89.6 90.6  PLT 267 260 267   Blood Culture    Component Value Date/Time   SDES FLUID PERITONEAL 06/24/2017 1539   SDES FLUID PERITONEAL 06/24/2017 1539   SPECREQUEST BOTTLES DRAWN AEROBIC AND ANAEROBIC 10CC 06/24/2017 1539   SPECREQUEST NONE 06/24/2017 1539   CULT PENDING 06/24/2017 1539   REPTSTATUS PENDING 06/24/2017 1539   REPTSTATUS 06/25/2017 FINAL 06/24/2017 1539    Recent Labs  Lab 06/24/17 0629 06/24/17 1133 06/24/17 1658 06/24/17 2100  06/25/17 0656  GLUCAP 82 74 117* 128* 195*    Medications: . cefTAZidime (FORTAZ)  IV Stopped (06/24/17 1800)  . vancomycin     . amLODipine  10 mg Oral QHS  . atorvastatin  40 mg Oral q1800  . cinacalcet  60 mg Oral Q supper  . cloNIDine  0.3 mg Oral TID  . [START ON 06/27/2017] darbepoetin (ARANESP) injection - DIALYSIS  200 mcg Intravenous Q Fri-HD  . docusate  100 mg Oral TID  . feeding supplement (NEPRO CARB STEADY)  237 mL Oral TID WC  . fentaNYL  25 mcg Transdermal Q72H  . ferric citrate  420 mg Oral TID WC  . gabapentin  100 mg Oral QHS  . heparin  5,000 Units Subcutaneous Q8H  . hydrALAZINE  10 mg Oral Q8H  . insulin aspart  0-9 Units Subcutaneous TID WC  . insulin glargine  14 Units Subcutaneous QHS  . insulin glargine  20 Units Subcutaneous Daily  . isosorbide dinitrate  60 mg Oral BID  . labetalol  300 mg Oral TID  . metoCLOPramide  2.5 mg Oral TID AC  . multivitamin  1 tablet Oral QHS  . pantoprazole  40 mg Oral Daily  .  protein supplement  1 scoop Oral TID WC  . sertraline  50 mg Oral Daily  . sevelamer carbonate  2,400 mg Oral TID WC   Dialysis Orders: MWF [facility] 4h 20min  400/800 85.5kg  2/2.25 bath  L AVF  P2  Hep 2000 Will be no heparin for time being 2.2 recent small intracranial bleed - Mircera 292mcg IV q 2wks - supposed to start 06/12/17  Assessment: 1. Sacral decub/osteo-Wound care;  Antibiotics resumed 1/29 for 6 week course (V/F) per ID. Bone bx not recommended (Dr. Johnnye Sima)  2. 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 VVS following 3. Poorly healing necrotic R groin wound - wound vac placed 1/23/ ATB's since 1/17 stopped for this 4. Fall/small left parietal IC bleed -MRI/cranial imaging can be done as outpatient per neurosurgery (seen once on day of admission by Dr. Christella Noa) 5. Ascites- s/p large vol paracentesis (9.8L) 1/17. Mildly distended abd. OK from my standpoint for retap  6. ESRD-HDMWF  - cannot HD in chair 2/2 pain at this time. For HD  today 7. Anemiaof CKD-Hgb 7.7, Aranesp 246mcg IV q Friday 8. Secondary hyperparathyroidism- . Corrected Ca close to 10.  Continue binders, reinforce compliance. Not on VDRA. Resumed sensipar (was on as outpt) Phos remains elevated on ayruxia and sevelamer. Hard to know if ^ P due to timing of binder/missed binders or diet. For lab recheck pre HD today  9. HTN, chronic/ severe/volume -Chronically elevated BP. KeepSBP>140on HD.Continue home meds. 10. Severe malnutrition- Alb 1.7, Renavite, Renal diet with fluid restrictions- intake variable -  added nepro/prostat and appears she is taking at least part of the time 11. CAD - per primary 12. Disp - CIR vs SNF- refused return to [place]. Transferred to Rehab. Will need to be able to sit in recliner for her treatment. Reports sacral decub more painful, says would not be able to sit it the recliner for HD with current pain level.   Jamal Maes, MD [name] [phone] Pager 06/24/2017, 10:33 AM

## 2017-06-25 NOTE — Progress Notes (Signed)
Speech Language Pathology Daily Session Note  Patient Details  Name: [name] MRN: [id number] Date of Birth: [birth year]  Today's Date: 06/25/2017 SLP Individual Time: 4132-4401 SLP Individual Time Calculation (min): 25 min  Short Term Goals: Week 1: SLP Short Term Goal 1 (Week 1): Pt will utilize external aids to facilitate recall of daily information with mod I.  SLP Short Term Goal 2 (Week 1): Pt will complete semi-complex tasks with mod I for functional problem solving.    Skilled Therapeutic Interventions:  Pt was seen for skilled ST targeting goals for cognition.  SLP facilitated the session with medication management tasks to address recall of daily information.  Pt was able to recall function and frequency of medications when named for >80% accuracy with mod I.  Pt was left in wheelchair with call bell within reach.  Pt with complaints of 10/10 sacral pain but politely declined pain meds.   Continue per current plan of care.    Function:  Eating Eating                 Cognition Comprehension Comprehension assist level: Follows complex conversation/direction with extra time/assistive device  Expression   Expression assist level: Expresses complex ideas: With extra time/assistive device  Social Interaction Social Interaction assist level: Interacts appropriately with others with medication or extra time (anti-anxiety, antidepressant).  Problem Solving Problem solving assist level: Solves complex 90% of the time/cues < 10% of the time  Memory Memory assist level: Recognizes or recalls 90% of the time/requires cueing < 10% of the time    Pain Pain Assessment Pain Assessment: 0-10 Pain Score: 10-Worst pain ever Pain Location: Buttocks Pain Descriptors / Indicators: Aching Pain Intervention(s): Repositioned  Therapy/Group: Individual Therapy  Lucero Ide, Selinda Orion 06/25/2017, 12:24 PM

## 2017-06-25 NOTE — Consult Note (Signed)
[place] Nurse wound follow up Wound type: surgical, right groin Measurement: 4cm x 0.2cm x 0.1cm  Wound bed: 100% pink, granulation tissue, clean Drainage (amount, consistency, odor) none Periwound:intact Pam Love PA into see patient with [place] nurse today, DC NPWT VAC dressing today Dressing procedure/placement/frequency: DC NPWT VAC dressings, begin hydrogel dressings 06/26/17.  Today I have placed small piece of saline moist gauze, covered with dry dressing. Secured with tape. Change daily. RBKA site clean and dry, with some superficial blood pooling under the skin but no drainage. Incision is well approximated.   Contacted Dr. Trula Slade today to notify of change, he is in agreement with plan to convert to hydrogel dressings.  [place] nurse will follow along for weekly assessments of the ischial wound for changes. Staff to notify me of any acute changes with any of her wounds.     Re consult if needed, will not follow at this time. Thanks  Linkyn Gobin [employer], RN,CWOCN, CNS, [place] [phone])

## 2017-06-25 NOTE — Progress Notes (Signed)
[facility] PHYSICAL MEDICINE & REHABILITATION     PROGRESS NOTE  Subjective/Complaints:  Patient seen lying in bed this morning. She states she slept well overnight. She denies complaints. However, yesterday patient noted to state that she would not be able to sitto tolerate HD and was also not able to fully tolerate paracentesis.  ROS: Denies CP, SOB, nausea, vomiting, diarrhea.  Objective: Vital Signs: Blood pressure (!) 163/58, pulse 71, temperature 97.9 F (36.6 C), temperature source Oral, resp. rate 18, height 6\' 4"  (1.93 m), weight 70 kg (154 lb 5.2 oz), last menstrual period 10/09/2015, SpO2 95 %. No results found. Recent Labs    06/23/17 0708  WBC 9.9  HGB 7.7*  HCT 25.1*  PLT 267   Recent Labs    06/23/17 0708  NA 134*  K 4.9  CL 92*  GLUCOSE 94  BUN 53*  CREATININE 6.20*  CALCIUM 8.6*   CBG (last 3)  Recent Labs    06/24/17 1658 06/24/17 2100 06/25/17 0656  GLUCAP 117* 128* 195*    Wt Readings from Last 3 Encounters:  06/23/17 70 kg (154 lb 5.2 oz)  06/23/17 76.8 kg (169 lb 6.4 oz)  06/11/17 83.5 kg (184 lb 1.3 oz)    Physical Exam:  BP (!) 163/58 (BP Location: Right Arm)   Pulse 71   Temp 97.9 F (36.6 C) (Oral)   Resp 18   Ht 6\' 4"  (1.93 m)   Wt 70 kg (154 lb 5.2 oz)   LMP 10/09/2015 Comment: pt on dialysis  SpO2 95%   BMI 18.78 kg/m  Constitutional: She has a sickly appearance. No distress.  HENT: Normocephalic and atraumatic.  Eyes:  and EOM are normal. No discharge.  Cardiovascular: RRR. No JVD. Respiratory: Effort normal and breath sounds normal.  GI: Bowel sounds are normal. She exhibits distension. Ascites.  Musculoskeletal: She exhibits edema.  Neurological: She is alert and oriented.  Motor: 3+/5 B/l UE, LLE, RLE HF (unchanged) Skin: Skin is warm and dry. She is not diaphoretic. Right groin wound with VAC Ischial Ulcer, not examined today Psychiatric: Her speech is normal. Her mood appears anxious. She expresses impulsivity.  She is inattentive.   Assessment/Plan: 1. 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 Physiatrist is providing close team supervision and 24 hour management of active medical problems listed below. Physiatrist and rehab team continue to assess barriers to discharge/monitor patient progress toward functional and medical goals.  Function:  Bathing Bathing position   Position: Bed  Bathing parts Body parts bathed by patient: Right arm, Left arm, Chest, Abdomen Body parts bathed by helper: Front perineal area, Buttocks, Right upper leg, Left upper leg, Right lower leg, Left lower leg  Bathing assist Assist Level: Touching or steadying assistance(Pt > 75%)      Upper Body Dressing/Undressing Upper body dressing   What is the patient wearing?: Pull over shirt/dress     Pull over shirt/dress - Perfomed by patient: Thread/unthread right sleeve, Thread/unthread left sleeve, Put head through opening Pull over shirt/dress - Perfomed by helper: Pull shirt over trunk        Upper body assist Assist Level: Touching or steadying assistance(Pt > 75%)      Lower Body Dressing/Undressing Lower body dressing   What is the patient wearing?: Pants, Socks, Shoes       Pants- Performed by helper: Thread/unthread right pants leg, Thread/unthread left pants leg,  Pull pants up/down       Socks - Performed by helper: Don/doff right sock, Don/doff left sock   Shoes - Performed by helper: Don/doff right shoe, Don/doff left shoe, Fasten right, Fasten left          Lower body assist Assist for lower body dressing: Touching or steadying assistance (Pt > 75%)      Toileting Toileting          Toileting assist     Transfers Chair/bed transfer   Chair/bed transfer method: Other Chair/bed transfer assist level: dependent (Pt equals 0%) Chair/bed transfer assistive device:  Mechanical lift Mechanical lift: Maximove   Locomotion Ambulation Ambulation activity did not occur: Safety/medical Editor, commissioning activity did not occur: Safety/medical concerns(pt in TIS at this time) Type: Manual      Cognition Comprehension Comprehension assist level: Understands complex 90% of the time/cues 10% of the time  Expression Expression assist level: Expresses complex ideas: With extra time/assistive device  Social Interaction Social Interaction assist level: Interacts appropriately 90% of the time - Needs monitoring or encouragement for participation or interaction.  Problem Solving Problem solving assist level: Solves basic 90% of the time/requires cueing < 10% of the time  Memory Memory assist level: Recognizes or recalls 75 - 89% of the time/requires cueing 10 - 24% of the time    Medical Problem List and Plan: 1.  Deficits with mobility, transfers, self-care secondary to Right BKA and wounds.   Continue CIR 2.  DVT Prophylaxis/Anticoagulation: Pharmaceutical: Lovenox 3. Pain Management:    Fentanyl 25 mcg patch    IV dilaudid transitioned to oxycodone prn.   4. Mood: Offer Ego support.  5. Neuropsych: This patient is capable of making decisions on her own behalf. 6. Skin/Wound Care: has evidence of malnutrition. Has been refusing nephro as well as protein supplements. Encourage intake to help promote wound healing as well as issues with ascites.    Ischial  Decub: Air mattress overlay for pressure relief measures. Added protein supplement for malnutrition. Dakin's solution with damp to dry dressing. May need plastics consult.            Right groin wound with VAC 7. Fluids/Electrolytes/Nutrition: Renal diet with 1200 cc FR. Monitor daily weights.  8. Left Ischium decub with osteo: Daily local care with saline dressing. Need to encourage intake of supplements to promote wound healing.     Per ID, Vanc/Ceftazidime x6 weeks after  discussion 9. T2DM: Monitor BS ac/hs. Continue Lantus bid with SSI for elevated BS. Titrate as needed.   Labile on 1/30 10. HTN: Monitor BP bid. Continue isosorbide bid, labetalol tid, clonidine tid and norvasc daily.    Hydralazine 10 3 times a day started on 1/30 11. Pruritis: Question due to rise in phosphorous levels. Sarna tid with benadryl prn.  12. Ascites: Monitor weight daily.  Filed Weights   06/23/17 1649  Weight: 70 kg (154 lb 5.2 oz)    Incomplete tap on 1/30, removing 2 L, appreciate VIR 13. ESRD   Recs per Nephro, currently patient states she will have able to tolerate HD 14. Acute on chronic anemia  Hb 7.7 on 1/28   Cont to monitor   LOS (Days) 2 A FACE TO FACE EVALUATION WAS PERFORMED  Baxter Gonzalez Lorie Phenix 06/25/2017 8:42 AM

## 2017-06-26 ENCOUNTER — Inpatient Hospital Stay (HOSPITAL_COMMUNITY): Payer: Medicare Other | Admitting: Occupational Therapy

## 2017-06-26 ENCOUNTER — Other Ambulatory Visit: Payer: Self-pay

## 2017-06-26 ENCOUNTER — Inpatient Hospital Stay (HOSPITAL_COMMUNITY): Payer: Medicare Other

## 2017-06-26 ENCOUNTER — Inpatient Hospital Stay (HOSPITAL_COMMUNITY): Payer: Medicare Other | Admitting: *Deleted

## 2017-06-26 LAB — GLUCOSE, CAPILLARY
GLUCOSE-CAPILLARY: 225 mg/dL — AB (ref 65–99)
GLUCOSE-CAPILLARY: 151 mg/dL — AB (ref 65–99)
GLUCOSE-CAPILLARY: 221 mg/dL — AB (ref 65–99)
Glucose-Capillary: 195 mg/dL — ABNORMAL HIGH (ref 65–99)

## 2017-06-26 MED ORDER — HYDRALAZINE HCL 25 MG PO TABS
25.0000 mg | ORAL_TABLET | Freq: Three times a day (TID) | ORAL | Status: DC
  Administered 2017-06-26 – 2017-06-30 (×12): 25 mg via ORAL
  Filled 2017-06-26 (×12): qty 1

## 2017-06-26 NOTE — Patient Care Conference (Signed)
Inpatient RehabilitationTeam Conference and Plan of Care Update Date: 06/25/2017   Time: 2:35 PM    Patient Name: [name]      Medical Record Number: [id number]  Date of Birth: 01-09-1976 Sex: Female         Room/Bed: 4M07C/4M07C-01 Payor Info: Payor: MEDICARE / Plan: MEDICARE PART A AND B / Product Type: *No Product type* /    Admitting Diagnosis: L BKA, hs mef  Admit Date/Time:  06/23/2017  4:23 PM Admission Comments: No comment available   Primary Diagnosis:  <principal problem not specified> Principal Problem: <principal problem not specified>  Patient Active Problem List   Diagnosis Date Noted  . Pain   . Diabetic osteomyelitis ([place])   . Acute blood loss anemia   . Anemia of chronic disease   . ESRD on dialysis ([place])   . Benign essential HTN   . Diabetes mellitus type 2 in nonobese (HCC)   . Unilateral complete BKA, right, initial encounter ([place]) 06/23/2017  . Post-operative pain   . Unilateral complete BKA, right, sequela ([place])   . Stage IV pressure ulcer of sacral region ([place]) 06/16/2017  . Wound healing, delayed   . Sacral osteomyelitis ([place])   . Chronic renal failure   . Brain mass 06/11/2017  . Fall 06/11/2017  . 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 History of ETT   . Pressure injury of skin 04/22/2017  . Acute respiratory failure with hypoxia ([place])   . Cardiac arrest, cause unspecified ([place])   . Non-healing ulcer ([place])   . Hypertension 03/24/2017  . Hypertensive urgency 03/24/2017  . Cellulitis in diabetic foot ([place]) 03/24/2017  . Cellulitis 03/24/2017  . Chronic pain 03/24/2017  . Diabetic ulcer of lower leg ([place]) 03/24/2017  . Gangrene of toe of right foot ([place])   . Acute on chronic diastolic CHF (congestive heart failure) ([place]) 02/22/2016  . Fluid overload   . Pericardial effusion   . Ascites 02/15/2016  . Uncontrolled diabetes mellitus with end-stage renal disease ([place]) 02/16/2015  . Bimalleolar ankle fracture  02/12/2015  . CAD (coronary artery disease) 02/09/2015  . PAD (peripheral artery disease) ([place])   . Gastroparesis   . Chronic anemia   . Menorrhagia with regular cycle 01/04/2014  . Dysmenorrhea 01/04/2014  . Nausea & vomiting 10/16/2011  . History of noncompliance with medical treatment 05/09/2011  . Chronic UTI 05/09/2011  . CIN III (cervical intraepithelial neoplasia grade III) with severe dysplasia   . History of abscesses in groins 12/06/2010  . End stage renal disease on dialysis ([place])   . Coronary artery disease   . Hyperlipidemia   . 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 NONDEPENDENT TOBACCO USE DISORDER 12/20/2009  . Essential hypertension, benign 12/20/2009  . NEPHROTIC SYNDROME 12/20/2009    Expected Discharge Date: Expected Discharge Date: (3-4 weeks)  Team Members Present: Physician leading conference: Dr. Delice Lesch Social Worker Present: Alfonse Alpers, LCSW Nurse Present: Dorien Chihuahua, RN PT Present: Roderic Ovens, PT OT Present: Willeen Cass, OT SLP Present: Windell Moulding, SLP PPS Coordinator present : Daiva Nakayama, RN, CRRN     Current Status/Progress Goal Weekly Team Focus  Medical   Deficits with mobility, transfers, self-care secondary to Right BKA and wounds  Improve wounds, mobility, transfers, safety, ascites, ability tolerate HD, Osteo  See above   Bowel/Bladder   continent of b/b with total A, LBM 1/27, oliguric on HD  maintain b/b with min A  monitor b/b q shift  and prn    Swallow/Nutrition/ Hydration             ADL's   transfers with a lift due to unable to transfer clearning surface due to wound, bed level bathing/ dressing with max A  min A overall   transfers, sit to stand, promoting knee extension on right, bed mobility, pain management    Mobility   total A  min A  activity and out of bed tolerance   Communication             Safety/Cognition/ Behavioral Observations  supervision   mod I   higher level memory and problem solving     Pain   pain at 9-10, with minimal relief from oxy q 4 hours and ultram q6  pain <4 with min S  monitor pain q shift and prn,    Skin   Stage 4 on icshial area, w/d dressing bid, wound vac to groin, R BKA incision inflamed, stump shrinker too large for stump  maintain skin and wounds with min A  monitor skin q shift and prn, treat as ordered.    Rehab Goals Patient on target to meet rehab goals: Yes Rehab Goals Revised: none *See Care Plan and progress notes for long and short-term goals.     Barriers to Discharge  Current Status/Progress Possible Resolutions Date Resolved   Physician    Medical stability;Decreased caregiver support;Lack of/limited family support;Hemodialysis;Medication compliance;Other (comments);IV antibiotics  Ascities  See above  Therapies, HD recs per Nephro, Paracentesis as tolerated, follow labs, optimize pain meds, IV abx      Nursing                  PT  Medical stability;Wound Care;Hemodialysis;Lack of/limited family support                 OT Wound Care                SLP                SW                Discharge Planning/Teaching Needs:  Pt is hopeful to return to her home where her husband and dtr can provide care, as long as she can reach manageable level of care.  CSW to confirm this with pt's family.  Family to be offered education prior to d/c.   Team Discussion:  Pt with new R BKA and chronic pain.  She has an ulcer on her coccyx and WOC, plastics, and ID are following, with pt being on 6 weeks of IV antibiotics.  VAC has been removed from groin wound and pt's diabetes is stable.  BP has been elevated and Dr. Posey Pronto is starting new medication.  Pt needs dialysis and has not received it due to not being able to stay in position for dialysis with wound on bottom.  Pt did well with sara stedy today and sat edge of bed and up in chair some.  PT is concerned about contracture and way pt's leg is positioned.  OT worked with pt bed level and set min A  goals overall.  ST working with pt 3x/week on higher level tasks.  Husband helped some at home prior to admission.  Mod I ST goals.    Revisions to Treatment Plan:  none    Continued Need for Acute Rehabilitation Level of Care: The patient requires daily medical management by a physician with specialized  training in physical medicine and rehabilitation for the following conditions: Daily direction of a multidisciplinary physical rehabilitation program to ensure safe treatment while eliciting the highest outcome that is of practical value to the patient.: Yes Daily medical management of patient stability for increased activity during participation in an intensive rehabilitation regime.: Yes Daily analysis of laboratory values and/or radiology reports with any subsequent need for medication adjustment of medical intervention for : Wound care problems;Diabetes problems;Blood pressure problems;Post surgical problems;Renal problems;Other  Jream Broyles, Silvestre Mesi 06/26/2017, 12:20 PM

## 2017-06-26 NOTE — Progress Notes (Signed)
Social Work Patient ID: [name], female   DOB: 09/07/1975, 42 y.o.   MRN: [id number]     [name], [name]  Social Worker    Patient Care Conference  Signed  Date of Service:  06/26/2017 12:20 PM          Signed          [] Hide copied text  [] Hover for details   Inpatient RehabilitationTeam Conference and Plan of Care Update Date: 06/25/2017   Time: 2:35 PM      Patient Name: [name]      Medical Record Number: [id number]  Date of Birth: 07-17-1975 Sex: Female         Room/Bed: 4M07C/4M07C-01 Payor Info: Payor: MEDICARE / Plan: MEDICARE PART A AND B / Product Type: *No Product type* /     Admitting Diagnosis: L BKA, hs mef  Admit Date/Time:  06/23/2017  4:23 PM Admission Comments: No comment available    Primary Diagnosis:  <principal problem not specified> Principal Problem: <principal problem not specified>       Patient Active Problem List    Diagnosis Date Noted  . Pain    . Diabetic osteomyelitis ([place])    . Acute blood loss anemia    . Anemia of chronic disease    . ESRD on dialysis ([place])    . Benign essential HTN    . Diabetes mellitus type 2 in nonobese (HCC)    . Unilateral complete BKA, right, initial encounter ([place]) 06/23/2017  . Post-operative pain    . Unilateral complete BKA, right, sequela ([place])    . Stage IV pressure ulcer of sacral region ([place]) 06/16/2017  . Wound healing, delayed    . Sacral osteomyelitis ([place])    . Chronic renal failure    . Brain mass 06/11/2017  . Fall 06/11/2017  . 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 History of ETT    . Pressure injury of skin 04/22/2017  . Acute respiratory failure with hypoxia ([place])    . Cardiac arrest, cause unspecified ([place])    . Non-healing ulcer ([place])    . Hypertension 03/24/2017  . Hypertensive urgency 03/24/2017  . Cellulitis in diabetic foot ([place]) 03/24/2017  . Cellulitis 03/24/2017  . Chronic pain 03/24/2017  . Diabetic ulcer of  lower leg ([place]) 03/24/2017  . Gangrene of toe of right foot ([place])    . Acute on chronic diastolic CHF (congestive heart failure) ([place]) 02/22/2016  . Fluid overload    . Pericardial effusion    . Ascites 02/15/2016  . Uncontrolled diabetes mellitus with end-stage renal disease ([place]) 02/16/2015  . Bimalleolar ankle fracture 02/12/2015  . CAD (coronary artery disease) 02/09/2015  . PAD (peripheral artery disease) ([place])    . Gastroparesis    . Chronic anemia    . Menorrhagia with regular cycle 01/04/2014  . Dysmenorrhea 01/04/2014  . Nausea & vomiting 10/16/2011  . History of noncompliance with medical treatment 05/09/2011  . Chronic UTI 05/09/2011  . CIN III (cervical intraepithelial neoplasia grade III) with severe dysplasia    . History of abscesses in groins 12/06/2010  . End stage renal disease on dialysis ([place])    . Coronary artery disease    . Hyperlipidemia    . 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 NONDEPENDENT TOBACCO USE DISORDER 12/20/2009  . Essential hypertension, benign 12/20/2009  . NEPHROTIC SYNDROME 12/20/2009      Expected Discharge Date: Expected Discharge Date: (  3-4 weeks)   Team Members Present: Physician leading conference: Dr. Delice Lesch Social Worker Present: Alfonse Alpers, LCSW Nurse Present: Dorien Chihuahua, RN PT Present: Roderic Ovens, PT OT Present: Willeen Cass, OT SLP Present: Windell Moulding, SLP PPS Coordinator present : Daiva Nakayama, RN, CRRN       Current Status/Progress Goal Weekly Team Focus  Medical     Deficits with mobility, transfers, self-care secondary to Right BKA and wounds  Improve wounds, mobility, transfers, safety, ascites, ability tolerate HD, Osteo  See above   Bowel/Bladder     continent of b/b with total A, LBM 1/27, oliguric on HD  maintain b/b with min A  monitor b/b q shift and prn    Swallow/Nutrition/ Hydration               ADL's     transfers with a lift due to unable to transfer clearning surface due to wound, bed level  bathing/ dressing with max A  min A overall   transfers, sit to stand, promoting knee extension on right, bed mobility, pain management    Mobility     total A  min A  activity and out of bed tolerance   Communication               Safety/Cognition/ Behavioral Observations   supervision   mod I   higher level memory and problem solving    Pain     pain at 9-10, with minimal relief from oxy q 4 hours and ultram q6  pain <4 with min S  monitor pain q shift and prn,    Skin     Stage 4 on icshial area, w/d dressing bid, wound vac to groin, R BKA incision inflamed, stump shrinker too large for stump  maintain skin and wounds with min A  monitor skin q shift and prn, treat as ordered.     Rehab Goals Patient on target to meet rehab goals: Yes Rehab Goals Revised: none *See Care Plan and progress notes for long and short-term goals.      Barriers to Discharge   Current Status/Progress Possible Resolutions Date Resolved   Physician     Medical stability;Decreased caregiver support;Lack of/limited family support;Hemodialysis;Medication compliance;Other (comments);IV antibiotics  Ascities  See above  Therapies, HD recs per Nephro, Paracentesis as tolerated, follow labs, optimize pain meds, IV abx      Nursing                 PT  Medical stability;Wound Care;Hemodialysis;Lack of/limited family support                 OT Wound Care               SLP            SW              Discharge Planning/Teaching Needs:  Pt is hopeful to return to her home where her husband and dtr can provide care, as long as she can reach manageable level of care.  CSW to confirm this with pt's family.  Family to be offered education prior to d/c.   Team Discussion:  Pt with new R BKA and chronic pain.  She has an ulcer on her coccyx and WOC, plastics, and ID are following, with pt being on 6 weeks of IV antibiotics.  VAC has been removed from groin wound and pt's diabetes is stable.  BP has been elevated and  Dr. Posey Pronto is starting new medication.  Pt needs dialysis and has not received it due to not being able to stay in position for dialysis with wound on bottom.  Pt did well with sara stedy today and sat edge of bed and up in chair some.  PT is concerned about contracture and way pt's leg is positioned.  OT worked with pt bed level and set min A goals overall.  ST working with pt 3x/week on higher level tasks.  Husband helped some at home prior to admission.  Mod I ST goals.    Revisions to Treatment Plan:  none    Continued Need for Acute Rehabilitation Level of Care: The patient requires daily medical management by a physician with specialized training in physical medicine and rehabilitation for the following conditions: Daily direction of a multidisciplinary physical rehabilitation program to ensure safe treatment while eliciting the highest outcome that is of practical value to the patient.: Yes Daily medical management of patient stability for increased activity during participation in an intensive rehabilitation regime.: Yes Daily analysis of laboratory values and/or radiology reports with any subsequent need for medication adjustment of medical intervention for : Wound care problems;Diabetes problems;Blood pressure problems;Post surgical problems;Renal problems;Other   Randal Goens, Silvestre Mesi 06/26/2017, 12:20 PM

## 2017-06-26 NOTE — Progress Notes (Signed)
[facility] Progress Note   Subjective:     Has been working with PT Had HD yesterday Wound care, vascular and CIR notes reviewed  Objective Vitals:   06/25/17 1800 06/25/17 1830 06/26/17 0608 06/26/17 0700  BP: (!) 162/78 (!) 159/79 (!) 168/74 (!) 168/67  Pulse: 72 73  70  Resp: 18 18  16   Temp:  98.3 F (36.8 C)  98.4 F (36.9 C)  TempSrc:  Oral  Oral  SpO2:  96%  98%  Weight:    108 kg (238 lb 1.6 oz)  Height:       Physical Exam  Frail and almost skeletal in appearance Small mild L facial droop Regular S1S2 No S3 Lungs clear Abd less tight tho still sig ascites + BS. Not tender R groin with wound VAC in place R BKA No LLE edema Sacral wounds covered Marked muscle wasting throughout Dialysis Access: left AVF + bruit    Recent Labs  Lab 06/20/17 0755 06/23/17 0708 06/25/17 1420  NA 133* 134* 134*  K 4.9 4.9 4.4  CL 92* 92* 94*  CO2 24 25 26   GLUCOSE 181* 94 263*  BUN 48* 53* 46*  CREATININE 5.57* 6.20* 5.66*  CALCIUM 8.7* 8.6* 7.8*  PHOS 8.0* 7.4* 4.6    Recent Labs  Lab 06/20/17 0755 06/23/17 0708 06/25/17 1420  ALBUMIN 1.7* 1.6* 1.6*    Recent Labs  Lab 06/20/17 0754 06/23/17 0708 06/25/17 1420  WBC 11.1* 9.9 11.9*  HGB 7.7* 7.7* 7.7*  HCT 24.1* 25.1* 24.9*  MCV 89.6 90.6 91.2  PLT 260 267 265   Blood Culture    Component Value Date/Time   SDES FLUID PERITONEAL 06/24/2017 1539   SDES FLUID PERITONEAL 06/24/2017 1539   SPECREQUEST BOTTLES DRAWN AEROBIC AND ANAEROBIC 10CC 06/24/2017 1539   SPECREQUEST NONE 06/24/2017 1539   CULT PENDING 06/24/2017 1539   REPTSTATUS PENDING 06/24/2017 1539   REPTSTATUS 06/25/2017 FINAL 06/24/2017 1539    Recent Labs  Lab 06/24/17 2100 06/25/17 0656 06/25/17 1156 06/25/17 2042 06/26/17 0649  GLUCAP 128* 195* 274* 210* 225*    Medications: . cefTAZidime (FORTAZ)  IV Stopped (06/25/17 1414)  . vancomycin 750 mg (06/25/17 1730)   . amLODipine  10 mg Oral QHS  .  atorvastatin  40 mg Oral q1800  . cinacalcet  60 mg Oral Q supper  . cloNIDine  0.3 mg Oral TID  . [START ON 06/27/2017] darbepoetin (ARANESP) injection - DIALYSIS  200 mcg Intravenous Q Fri-HD  . docusate  100 mg Oral TID  . feeding supplement (NEPRO CARB STEADY)  237 mL Oral TID WC  . fentaNYL  25 mcg Transdermal Q72H  . ferric citrate  420 mg Oral TID WC  . gabapentin  100 mg Oral QHS  . heparin  5,000 Units Subcutaneous Q8H  . hydrALAZINE  25 mg Oral Q8H  . insulin aspart  0-9 Units Subcutaneous TID WC  . insulin glargine  14 Units Subcutaneous QHS  . insulin glargine  20 Units Subcutaneous Daily  . isosorbide dinitrate  60 mg Oral BID  . labetalol  300 mg Oral TID  . metoCLOPramide  2.5 mg Oral TID AC  . multivitamin  1 tablet Oral QHS  . pantoprazole  40 mg Oral Daily  . protein supplement  1 scoop Oral TID WC  . sertraline  50 mg Oral Daily  . sevelamer carbonate  2,400 mg Oral TID WC   Dialysis Orders: MWF NW 4h 73min  400/800  85.5kg  2/2.25 bath  L AVF  P2  Hep 2000 Will be no heparin for time being 2.2 recent small intracranial bleed - Mircera 262mcg IV q 2wks - supposed to start 06/12/17  Assessment: 1. Sacral decub/osteo-Wound care;  Antibiotics resumed 1/29 for 6 week course (V/F) per ID. Bone bx not recommended (Dr. Johnnye Sima)  2. 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 VVS following 3. Poorly healing necrotic R groin wound - wound vac placed 1/23. VVS is following (stump and groin) 4. Fall/small left parietal IC bleed -MRI/cranial imaging can be done as outpatient per neurosurgery (seen once on day of admission by Dr. Christella Noa) 5. Ascites- s/p large vol paracentesis (9.8L) 1/17. 2 liters on 1/29 (unable to lie on back so further fluid removal not accomplished) No longer taut abd 6. ESRD-HDMWF  - cannot HD in chair 2/2 pain at this time.  7. Anemiaof CKD-Hgb 7.7, Aranesp 243mcg IV q Friday 8. Secondary hyperparathyroidism- . Corrected Ca close to 10.  Continue binders. Not  on VDRA. Resumed sensipar (was on as outpt) Phos improved on ayruxia and sevelamer.   9. HTN, chronic/ severe/volume -Chronically elevated BP. KeepSBP>140on HD. Hydralazine dose just ^ to 65 10. Severe malnutrition- Alb 1.6, Renavite, Renal diet with fluid restrictions- intake variable -  added nepro/prostat and appears she is taking at least part of the time 11. CAD - per primary 12. Disp - Will need to be able to sit in recliner for her treatment. Reports sacral decub more painful, says would not be able to sit it the recliner for HD with current pain level but hopeful this will improve.   Jamal Maes, MD [facility] [phone] Pager 06/24/2017, 10:33 AM

## 2017-06-26 NOTE — Progress Notes (Signed)
[place] PHYSICAL MEDICINE & REHABILITATION     PROGRESS NOTE  Subjective/Complaints:  Patient seen lying in bed this morning. Family at bedside. She states she slept well overnight. She denies complaints. Her knee remains flexed. Her wound VAC VAC was DC'd yesterday.  ROS: Denies CP, SOB, nausea, vomiting, diarrhea.  Objective: Vital Signs: Blood pressure (!) 168/67, pulse 70, temperature 98.4 F (36.9 C), temperature source Oral, resp. rate 16, height 6\' 4"  (1.93 m), weight 108 kg (238 lb 1.6 oz), last menstrual period 10/09/2015, SpO2 98 %. Ir Paracentesis  Result Date: 06/25/2017 INDICATION: Patient with history of ascites. Request is made for diagnostic and therapeutic paracentesis. EXAM: ULTRASOUND GUIDED DIAGNOSTIC AND THERAPEUTIC PARACENTESIS MEDICATIONS: 10 mL 1% lidocaine COMPLICATIONS: None immediate. PROCEDURE: Informed written consent was obtained from the patient after a discussion of the risks, benefits and alternatives to treatment. A timeout was performed prior to the initiation of the procedure. Initial ultrasound scanning demonstrates a large amount of ascites within the left lateral abdomen. The left lateral abdomen was prepped and draped in the usual sterile fashion. 1% lidocaine was used for local anesthesia. Following this, a 19 gauge, 7-cm, Yueh catheter was introduced. An ultrasound image was saved for documentation purposes. The paracentesis was performed. The catheter was removed and a dressing was applied. The patient tolerated the procedure well without immediate post procedural complication. FINDINGS: A total of approximately 2.0 liters of clear, yellow fluid was removed. Samples were sent to the laboratory as requested by the clinical team. IMPRESSION: Successful ultrasound-guided diagnostic and therapeutic paracentesis yielding 2.0 liters of peritoneal fluid. Patient poorly tolerated procedure due to pain and inability to position or reposition easily and comfortably.  Procedure was stopped prior to removal of all fluid at the patient's request. Read by: Brynda Greathouse PA-C Electronically Signed   By: Sandi Mariscal M.D.   On: 06/24/2017 15:57   Recent Labs    06/25/17 1420  WBC 11.9*  HGB 7.7*  HCT 24.9*  PLT 265   Recent Labs    06/25/17 1420  NA 134*  K 4.4  CL 94*  GLUCOSE 263*  BUN 46*  CREATININE 5.66*  CALCIUM 7.8*   CBG (last 3)  Recent Labs    06/25/17 1156 06/25/17 2042 06/26/17 0649  GLUCAP 274* 210* 225*    Wt Readings from Last 3 Encounters:  06/26/17 108 kg (238 lb 1.6 oz)  06/23/17 76.8 kg (169 lb 6.4 oz)  06/11/17 83.5 kg (184 lb 1.3 oz)    Physical Exam:  BP (!) 168/67 (BP Location: Right Arm)   Pulse 70   Temp 98.4 F (36.9 C) (Oral)   Resp 16   Ht 6\' 4"  (1.93 m)   Wt 108 kg (238 lb 1.6 oz)   LMP 10/09/2015 Comment: pt on dialysis  SpO2 98%   BMI 28.98 kg/m  Constitutional: She has a sickly appearance. No distress.  HENT: Normocephalic and atraumatic.  Eyes:  and EOM are normal. No discharge.  Cardiovascular: RRR. No JVD. Respiratory: Effort normal and breath sounds normal.  GI: Bowel sounds are normal. She exhibits distension. Ascites.  Musculoskeletal: She exhibits edema.  Neurological: She is alert and oriented.  Motor: 3+/5 B/l UE, LLE, RLE HF (stable) Skin: Skin is warm and dry. She is not diaphoretic. Right groin wound C/D/I Ischial Ulcer, not examined today BKA with dusky posterior flap and dry ulcer proximal to incision Psychiatric: Her speech is normal. Her mood appears anxious. She expresses impulsivity. She is inattentive.  Assessment/Plan: 1. 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 Physiatrist is providing close team supervision and 24 hour management of active medical problems listed below. Physiatrist and rehab team continue to assess barriers to discharge/monitor patient  progress toward functional and medical goals.  Function:  Bathing Bathing position   Position: Bed  Bathing parts Body parts bathed by patient: Right arm, Left arm, Chest, Abdomen Body parts bathed by helper: Front perineal area, Buttocks, Right upper leg, Left upper leg, Right lower leg, Left lower leg  Bathing assist Assist Level: Touching or steadying assistance(Pt > 75%)      Upper Body Dressing/Undressing Upper body dressing   What is the patient wearing?: Pull over shirt/dress     Pull over shirt/dress - Perfomed by patient: Thread/unthread right sleeve, Thread/unthread left sleeve, Put head through opening Pull over shirt/dress - Perfomed by helper: Pull shirt over trunk        Upper body assist Assist Level: Touching or steadying assistance(Pt > 75%)      Lower Body Dressing/Undressing Lower body dressing   What is the patient wearing?: Pants, Socks, Shoes       Pants- Performed by helper: Thread/unthread right pants leg, Thread/unthread left pants leg, Pull pants up/down       Socks - Performed by helper: Don/doff right sock, Don/doff left sock   Shoes - Performed by helper: Don/doff right shoe, Don/doff left shoe, Fasten right, Fasten left          Lower body assist Assist for lower body dressing: Touching or steadying assistance (Pt > 75%)      Toileting Toileting          Toileting assist     Transfers Chair/bed transfer   Chair/bed transfer method: Other Chair/bed transfer assist level: dependent (Pt equals 0%) Chair/bed transfer assistive device: Mechanical lift Mechanical lift: Maximove   Locomotion Ambulation Ambulation activity did not occur: Safety/medical Editor, commissioning activity did not occur: Safety/medical concerns(pt in TIS at this time) Type: Manual      Cognition Comprehension Comprehension assist level: Follows complex conversation/direction with extra time/assistive device  Expression Expression  assist level: Expresses complex ideas: With extra time/assistive device  Social Interaction Social Interaction assist level: Interacts appropriately with others with medication or extra time (anti-anxiety, antidepressant).  Problem Solving Problem solving assist level: Solves complex 90% of the time/cues < 10% of the time  Memory Memory assist level: Recognizes or recalls 90% of the time/requires cueing < 10% of the time    Medical Problem List and Plan: 1.  Deficits with mobility, transfers, self-care secondary to Right BKA and wounds.   Continue CIR 2.  DVT Prophylaxis/Anticoagulation: Pharmaceutical: Lovenox 3. Pain Management:    Fentanyl 25 mcg patch    IV dilaudid transitioned to oxycodone prn.   4. Mood: Offer Ego support.  5. Neuropsych: This patient is capable of making decisions on her own behalf. 6. Skin/Wound Care: has evidence of malnutrition. Has been refusing nephro as well as protein supplements. Encourage intake to help promote wound healing as well as issues with ascites.    Ischial  Decub: Air mattress overlay for pressure relief measures. Added protein supplement for malnutrition. Dakin's solution with damp to dry dressing. May need plastics consult.            Right groin wound with VAC 7. Fluids/Electrolytes/Nutrition: Renal  diet with 1200 cc FR. Monitor daily weights.  8. Left Ischium decub with osteo: Daily local care with saline dressing. Need to encourage intake of supplements to promote wound healing.     Per ID, Vanc/Ceftazidime x6 weeks after discussion 9. T2DM: Monitor BS ac/hs. Continue Lantus bid with SSI for elevated BS. Titrate as needed.   Was reasonably controlled, however appears to be trending up in last 24 hours. Will continue to monitor 10. HTN: Monitor BP bid. Continue isosorbide bid, labetalol tid, clonidine tid and norvasc daily.    Hydralazine 10 3 times a day started on 1/30, increased to 25 on 1/31 11. Pruritis: Question due to rise in  phosphorous levels. Sarna tid with benadryl prn.  12. Ascites: Monitor weight daily.  Filed Weights   06/23/17 1649 06/26/17 0700  Weight: 70 kg (154 lb 5.2 oz) 108 kg (238 lb 1.6 oz)    Incomplete tap on 1/29, removing 2 L, appreciate VIR 13. ESRD   Recs per Nephro 14. Acute on chronic anemia  Hb 7.7 on 1/30   Cont to monitor   LOS (Days) 3 A FACE TO FACE EVALUATION WAS PERFORMED  Ankit Lorie Phenix 06/26/2017 9:03 AM

## 2017-06-26 NOTE — Progress Notes (Signed)
Occupational Therapy Session Note  Patient Details  Name: [name] MRN: [id number] Date of Birth: 1976/01/22  Today's Date: 06/26/2017 OT Individual Time: 0905-1000 OT Individual Time Calculation (min): 55 min    Short Term Goals: Week 1:  OT Short Term Goal 1 (Week 1): Pt will tolerate sitting EOB for 10 min to engage in sitting bathing and dressing. OT Short Term Goal 2 (Week 1): Pt will be able to loop pants over feet with min A. OT Short Term Goal 3 (Week 1): Pt will be able to pull pants over hips with lateral leans with min A. OT Short Term Goal 4 (Week 1): Pt will tolerate standing in [place] lift for 2-3 minutes at a time to develop standing tolerance.  OT Short Term Goal 5 (Week 1): Pt will be able to tolerate sitting on a padded BSC for 5-10 min.   Skilled Therapeutic Interventions/Progress Updates:    Pt presents sidelying in bed, agreeable to OT tx session. Pt c/o some pain in R shoulder and sacrum, though declining need for pain meds, managed with rest/repositioning during session. Pt completes rolling to L and R during session with MinGuard-MinA, supine>sitting EOB with ModA for trunk elevation into upright position. Positioned Pt on roho cushion while seated EOB for increased comfort/sitting tolerance. Pt stood in [place] x3 during session, initially for 32min, approx 30 seconds during trial 2 and 3. Additional focus on UB strengthening in between standing; Pt completing modified chair push-ups from EOB using bil hands on yoga blocks in preparation for clearing buttocks during functional transfers. Pt completing 4 rounds x5 reps each, facilitating anterior lean during completion with Pt able to minimally clear sacrum from EOB. Pt engaged in seated ball toss approx 10 min with close minguard for sitting balance. Pt fatigued and requires rest breaks throughout session but is motivated to improve. Returned to supine end of session with MaxA+2. Pt left sidelying in bed and positioned for  comfort, needs within reach.   Therapy Documentation Precautions:  Precautions Precautions: Fall Precaution Comments: wound vac from groin, sacral decubital wound  Restrictions Weight Bearing Restrictions: Yes RLE Weight Bearing: Non weight bearing    ADL: ADL ADL Comments: see functional naviagtor    See Function Navigator for Current Functional Status.   Therapy/Group: Individual Therapy  Raymondo Band 06/26/2017, 12:22 PM

## 2017-06-26 NOTE — Progress Notes (Signed)
Physical Therapy Session Note  Patient Details  Name: [name] MRN: [id number] Date of Birth: 1975-08-02  Today's Date: 06/26/2017 PT Individual Time: 1306-1400 PT Individual Time Calculation (min): 54 min   Short Term Goals: Week 1:  PT Short Term Goal 1 (Week 1): pt will tolerate sitting in w/c x 1 hour, 3 x a day PT Short Term Goal 2 (Week 1): pt will perform functional transfers without use of lift equipment with +2 assist  Skilled Therapeutic Interventions/Progress Updates:    Session focused on functional bed mobility (supine <> sit and rolling), overall endurance and activity tolerance, RLE stretching and AROM to increase knee extension and increase flexibility, sit <> stands with Clarise Cruz and addressing standing tolerance and upright posture, and pressure relief techniques and increasing bottom clearance with intention to progress to sit -> stand in parallel bars. Pt able to tolerate standing in [place] for transfers and then x 1.5 min and x 1 min before requiring rest break in seated position. Requires max assist in parallel bars to promote anterior weightshift and lift up bottom for clearance (able to clear bottom though) x 10 reps. Educated on w/c push ups for pressure relief technique with pt able to initiate but unable to complete full clearance. During session, performed dependent pressure relief in TIS w/c x 1. End of session returned back to bed with KI donned and all needs in reach.   Therapy Documentation Precautions:  Precautions Precautions: Fall Precaution Comments: wound vac from groin, sacral decubital wound  Restrictions Weight Bearing Restrictions: Yes RLE Weight Bearing: Non weight bearing   Pain: Reports on going pain in groin/wound area as well as RLE. Premedicated per report.   See Function Navigator for Current Functional Status.   Therapy/Group: Individual Therapy  Canary Brim Ivory Broad, PT, DPT  06/26/2017, 2:04 PM

## 2017-06-26 NOTE — Progress Notes (Signed)
Orthopedic Tech Progress Note Patient Details:  [name] 1976/05/06 [id number]  Ortho Devices Type of Ortho Device: Knee Immobilizer Ortho Device/Splint Location: RLE Ortho Device/Splint Interventions: Ordered, Application   Post Interventions Patient Tolerated: Well Instructions Provided: Care of device   Braulio Bosch 06/26/2017, 12:01 PM

## 2017-06-26 NOTE — Progress Notes (Signed)
[facility] is hard work.   Objective (!) 168/67 70 98.4 F (36.9 C) (Oral) 16 98%  Intake/Output Summary (Last 24 hours) at 06/26/2017 1016 Last data filed at 06/25/2017 1830 Gross per 24 hour  Intake 120 ml  Output 2000 ml  Net -1880 ml    Right BKA incision intact with surrounding ecchymosis Stump is warm to touch, pain with palpation Right groin discontinued wound vac  Assessment/Planning: S/P BKA the stump has residual ecchymosis not frank hematoma.  The stump is warm to touch..  We will continue to watch for any signs of tissue breakdown.     Encouraged to continue to work hard on mobility.  Roxy Horseman 06/26/2017 10:16 AM --  Laboratory Lab Results: Recent Labs    06/25/17 1420  WBC 11.9*  HGB 7.7*  HCT 24.9*  PLT 265   BMET Recent Labs    06/25/17 1420  NA 134*  K 4.4  CL 94*  CO2 26  GLUCOSE 263*  BUN 46*  CREATININE 5.66*  CALCIUM 7.8*    COAG Lab Results  Component Value Date   INR 1.14 06/17/2017   INR 1.20 06/11/2017   INR 1.19 05/09/2017   No results found for: PTT

## 2017-06-26 NOTE — Progress Notes (Signed)
Physical Therapy Note  Patient Details  Name: [name] MRN: [id number] Date of Birth: 1976/01/06 Today's Date: 06/26/2017  1130-1200, 30 min  Pain: 9/10 RLE; declined meds due to lethargy  Pt seen bedside for neuromuscular re-education via multimodal cues, demo for L side lying: 10 x 1 each clam shells for hip abduction, hip extension, knee extension.  Excursion limited by pain and muscular tighness.  Ortho Tech arrived with KI; PTs assisted with donning due to pt's pain and difficulty tolerating supine position.    Pt left resting in bed, set up for lunch at her request with tray to the side of bed, and alarm set and all needs at hand.  See function navigator for current status.   Kyrillos Adams 06/26/2017, 11:49 AM

## 2017-06-27 ENCOUNTER — Inpatient Hospital Stay (HOSPITAL_COMMUNITY): Payer: Medicare Other | Admitting: Occupational Therapy

## 2017-06-27 ENCOUNTER — Inpatient Hospital Stay (HOSPITAL_COMMUNITY): Payer: Medicare Other | Admitting: Speech Pathology

## 2017-06-27 ENCOUNTER — Inpatient Hospital Stay (HOSPITAL_COMMUNITY): Payer: Medicare Other | Admitting: Physical Therapy

## 2017-06-27 DIAGNOSIS — M8628 Subacute osteomyelitis, other site: Secondary | ICD-10-CM

## 2017-06-27 DIAGNOSIS — G8918 Other acute postprocedural pain: Secondary | ICD-10-CM | POA: Insufficient documentation

## 2017-06-27 DIAGNOSIS — G546 Phantom limb syndrome with pain: Secondary | ICD-10-CM

## 2017-06-27 LAB — RENAL FUNCTION PANEL
Albumin: 1.7 g/dL — ABNORMAL LOW (ref 3.5–5.0)
Anion gap: 15 (ref 5–15)
BUN: 50 mg/dL — ABNORMAL HIGH (ref 6–20)
CHLORIDE: 94 mmol/L — AB (ref 101–111)
CO2: 24 mmol/L (ref 22–32)
CREATININE: 5.92 mg/dL — AB (ref 0.44–1.00)
Calcium: 7.4 mg/dL — ABNORMAL LOW (ref 8.9–10.3)
GFR calc Af Amer: 9 mL/min — ABNORMAL LOW (ref >60)
GFR calc non Af Amer: 8 mL/min — ABNORMAL LOW (ref >60)
GLUCOSE: 200 mg/dL — AB (ref 65–99)
Phosphorus: 4 mg/dL (ref 2.5–4.6)
Potassium: 4.8 mmol/L (ref 3.5–5.1)
Sodium: 133 mmol/L — ABNORMAL LOW (ref 135–145)

## 2017-06-27 LAB — GLUCOSE, CAPILLARY
Glucose-Capillary: 229 mg/dL — ABNORMAL HIGH (ref 65–99)
Glucose-Capillary: 147 mg/dL — ABNORMAL HIGH (ref 65–99)
Glucose-Capillary: 281 mg/dL — ABNORMAL HIGH (ref 65–99)

## 2017-06-27 LAB — CBC
HCT: 25.7 % — ABNORMAL LOW (ref 36.0–46.0)
Hemoglobin: 7.8 g/dL — ABNORMAL LOW (ref 12.0–15.0)
MCH: 27.2 pg (ref 26.0–34.0)
MCHC: 30.4 g/dL (ref 30.0–36.0)
MCV: 89.5 fL (ref 78.0–100.0)
PLATELETS: 274 10*3/uL (ref 150–400)
RBC: 2.87 MIL/uL — ABNORMAL LOW (ref 3.87–5.11)
RDW: 16.5 % — AB (ref 11.5–15.5)
WBC: 13.3 10*3/uL — AB (ref 4.0–10.5)

## 2017-06-27 MED ORDER — INSULIN GLARGINE 100 UNIT/ML ~~LOC~~ SOLN
19.0000 [IU] | Freq: Every day | SUBCUTANEOUS | Status: DC
  Administered 2017-06-27: 19 [IU] via SUBCUTANEOUS
  Filled 2017-06-27: qty 0.19

## 2017-06-27 MED ORDER — OXYCODONE HCL 5 MG PO TABS
ORAL_TABLET | ORAL | Status: AC
  Administered 2017-06-27: 10 mg via ORAL
  Filled 2017-06-27: qty 2

## 2017-06-27 MED ORDER — DARBEPOETIN ALFA 200 MCG/0.4ML IJ SOSY
PREFILLED_SYRINGE | INTRAMUSCULAR | Status: AC
  Administered 2017-06-27: 200 ug via INTRAVENOUS
  Filled 2017-06-27: qty 0.4

## 2017-06-27 MED ORDER — VANCOMYCIN HCL IN DEXTROSE 750-5 MG/150ML-% IV SOLN
INTRAVENOUS | Status: AC
  Administered 2017-06-27: 750 mg via INTRAVENOUS
  Filled 2017-06-27: qty 150

## 2017-06-27 MED ORDER — ACETAMINOPHEN 325 MG PO TABS
ORAL_TABLET | ORAL | Status: AC
  Filled 2017-06-27: qty 2

## 2017-06-27 MED ORDER — LORAZEPAM 0.5 MG PO TABS
ORAL_TABLET | ORAL | Status: AC
  Administered 2017-06-27: 0.5 mg via ORAL
  Filled 2017-06-27: qty 1

## 2017-06-27 NOTE — Progress Notes (Signed)
Physical Therapy Note  Patient Details  Name: ARBADELLA KIMBLER MRN: [id number] Date of Birth: 1976/01/18 Today's Date: 06/27/2017    Time: 830-940 70 minutes  1:1 Pt c/o pain in Rt residual limb, RN made aware and states meds were given prior to session.  PT continued to encourage pt to perform desensitization to residual limb.  Supine to sit with mod A.  Standing in sara x 4 with focus on upright posture and hip and trunk extension.  Clarise Cruz transfer to [facility] w/c.  Standing at parallel bars initially pt able to only clear bottom but with +2 assist pt able to stand fully x 2 attempts, then fatigued.  UBE 3 min fwd/3 min bkwd for UE strength and endurance.  Pt left in TIS w/c with Rt knee immobilizer donned to promote Rt knee extension.   Jack Bolio 06/27/2017, 9:41 AM

## 2017-06-27 NOTE — Progress Notes (Signed)
Dialysis treatment completed.  2500 mL ultrafiltrated and net fluid removal 2000 mL.    Patient status unchanged. Lung sounds diminished to ausculation in all fields. No edema. Cardiac: NSR.  Disconnected lines and removed needles.  Pressure held for 10 minutes and band aid/gauze dressing applied.  Report given to bedside RN, Jiles Garter.

## 2017-06-27 NOTE — Procedures (Signed)
I have personally attended this patient's dialysis session.   No weight and unclear to me why can't be weighed in [place]  BP rising as treatment proceeds ^ UGF by another 0.6 kg L AVF 400 no issues 2K bath  Jamal Maes, MD [place] Pager 06/27/2017, 5:14 PM

## 2017-06-27 NOTE — Progress Notes (Signed)
Occupational Therapy Session Note  Patient Details  Name: [name] MRN: [id number] Date of Birth: 1976/01/04  Today's Date: 06/27/2017 OT Individual Time: 1000-1100 OT Individual Time Calculation (min): 60 min    Short Term Goals: Week 1:  OT Short Term Goal 1 (Week 1): Pt will tolerate sitting EOB for 10 min to engage in sitting bathing and dressing. OT Short Term Goal 2 (Week 1): Pt will be able to loop pants over feet with min A. OT Short Term Goal 3 (Week 1): Pt will be able to pull pants over hips with lateral leans with min A. OT Short Term Goal 4 (Week 1): Pt will tolerate standing in [place] lift for 2-3 minutes at a time to develop standing tolerance.  OT Short Term Goal 5 (Week 1): Pt will be able to tolerate sitting on a padded BSC for 5-10 min.   Skilled Therapeutic Interventions/Progress Updates:    Pt received in w/c and stated her husband assisted her with self care in w/c. Pt agreeable to therapy.  She was transported to gym and placed in parallel bars. Attempted sit to stand with bars 4x but pt's L leg was fatigued from earlier session.  Demonstrated to pt a slide board transfer and she felt that would be okay for her as her sacral wound does not extend to lower buttocks/ upper thigh.  Pt set up with slide board to mat and transferred with min A.  On mat she worked on a variety of exercises for core strength with overhead ball reaches and twists, AArom of R knee extension and AAROM of L leg knee ext and heel dorsiflexion.  Pt tolerated sitting on mat well so also practiced lateral leans so she can use them for clothing management.  Pt used board back to w/c with min A. Taken back to room for her next therapy session.    Therapy Documentation Precautions:  Precautions Precautions: Fall Precaution Comments: wound vac from groin, sacral decubital wound  Restrictions Weight Bearing Restrictions: Yes RLE Weight Bearing: Non weight bearing  Pain:   no c/o pain during OT  session  ADL: ADL ADL Comments: see functional naviagtor   See Function Navigator for Current Functional Status.   Therapy/Group: Individual Therapy  [place] 06/27/2017, 12:48 PM

## 2017-06-27 NOTE — Progress Notes (Signed)
Patient arrived to unit per bed.  Reviewed treatment plan and this RN agrees.  Report received from bedside RN, Jiles Garter.  Consent verified.  Patient A & ) X 4. Lung sounds diminished and clear to ausculation in all fields. Generalized BLE edema. Cardiac: NSR.  Prepped LLAVF with alcohol and cannulated with two 15 gauge needles.  Pulsation of blood noted.  Flushed access well with saline per protocol.  Connected and secured lines and initiated tx at 1445.  UF goal of 2000 mL and net fluid removal of 1500 mL.  Will continue to monitor.  Per bedside RN, pt refused hoyer lift weight prior to HD.  Weight charted in flow sheet is reported floor weight, method used to obtain unsure.

## 2017-06-27 NOTE — Progress Notes (Signed)
Social Work Patient ID: [name], female   DOB: 09/17/1975, 42 y.o.   MRN: [id number]   CSW met with pt 06-26-17 and then later talked with pt's husband via telephone to update them on team conference discussion.  Explained that we did not set a d/c date since pt was just getting started on CIR, but that we expected pt to need to be here for 3 to 4 weeks.  Pt was accepting of this, as was husband.  Husband sounded very supportive and said he will be prepared to care for her at home once she can safely transfer on her own without stedy/lift.  He stated that pt has been in the hospital since November and that they can handle 3 or 4 more weeks.  Pt stated she is trying not to take pain medications until her therapy day is finished, because otherwise she gets too sleepy.  Pt is motivated to get better, she just wishes the wound on her bottom didn't hurt so badly, that this limits her.  CSW offered encouragement and will ask neuropsychologist to see pt to offer support due to this extended hospitalization.  CSW remains available to assist as needed.

## 2017-06-27 NOTE — Progress Notes (Signed)
[facility]ILIT[facility]     PROGRESS NOTE  Subjective/Complaints:  Patient seen lying in bed this morning. She states she slept very well overnight. The knee immobilizer is in place.  ROS: Denies CP, SOB, nausea, vomiting, diarrhea.  Objective: Vital Signs: Blood pressure (!) 153/74, pulse 64, temperature (!) 97.3 F (36.3 C), temperature source Oral, resp. rate 18, height 6\' 4"  (1.93 m), weight 80 kg (176 lb 5.9 oz), last menstrual period 10/09/2015, SpO2 100 %. No results found. Recent Labs    06/25/17 1420  WBC 11.9*  HGB 7.7*  HCT 24.9*  PLT 265   Recent Labs    06/25/17 1420  NA 134*  K 4.4  CL 94*  GLUCOSE 263*  BUN 46*  CREATININE 5.66*  CALCIUM 7.8*   CBG (last 3)  Recent Labs    06/26/17 1703 06/26/17 2036 06/27/17 0629  GLUCAP 195* 221* 229*    Wt Readings from Last 3 Encounters:  06/27/17 80 kg (176 lb 5.9 oz)  06/23/17 76.8 kg (169 lb 6.4 oz)  06/11/17 83.5 kg (184 lb 1.3 oz)    Physical Exam:  BP (!) 153/74 (BP Location: Right Arm)   Pulse 64   Temp (!) 97.3 F (36.3 C) (Oral)   Resp 18   Ht 6\' 4"  (1.93 m)   Wt 80 kg (176 lb 5.9 oz)   LMP 10/09/2015 Comment: pt on dialysis  SpO2 100%   BMI 21.47 kg/m  Constitutional: She has a sickly appearance. No distress.  HENT: Normocephalic and atraumatic.  Eyes:  and EOM are normal. No discharge.  Cardiovascular: RRR. No JVD. Respiratory: Effort normal and breath sounds normal.  GI: Bowel sounds are normal. She exhibits distension. Ascites.  Musculoskeletal: She exhibits edema.  Neurological: She is alert and oriented.  Motor: 3+/5 B/l UE, LLE, RLE HF (unchanged) Skin: Skin is warm and dry. She is not diaphoretic. Right groin wound C/D/I, not examined today Ischial Ulcer, not examined today BKA with dressing C/D/I  Psychiatric: Her speech is normal. Her mood appears anxious. She expresses impulsivity. She is inattentive.   Assessment/Plan: 1. Functional deficits  secondary to Right BKA and wounds which require 3+ hours per day of interdisciplinary therapy in a comprehensive inpatient rehab setting. Physiatrist is providing close team supervision and 24 hour management of active medical problems listed below. Physiatrist and rehab team continue to assess barriers to discharge/monitor patient progress toward functional and medical goals.  Function:  Bathing Bathing position   Position: Bed  Bathing parts Body parts bathed by patient: Right arm, Left arm, Chest, Abdomen Body parts bathed by helper: Front perineal area, Buttocks, Right upper leg, Left upper leg, Right lower leg, Left lower leg  Bathing assist Assist Level: (40%, maximal assist level)      Upper Body Dressing/Undressing Upper body dressing   What is the patient wearing?: Pull over shirt/dress     Pull over shirt/dress - Perfomed by patient: Thread/unthread right sleeve, Thread/unthread left sleeve, Put head through opening Pull over shirt/dress - Perfomed by helper: Pull shirt over trunk        Upper body assist Assist Level: Touching or steadying assistance(Pt > 75%)      Lower Body Dressing/Undressing Lower body dressing   What is the patient wearing?: Pants, Socks, Shoes       Pants- Performed by helper: Thread/unthread right pants leg, Thread/unthread left pants leg, Pull pants up/down       Socks - Performed by helper:  Don/doff right sock, Don/doff left sock   Shoes - Performed by helper: Don/doff right shoe, Don/doff left shoe, Fasten right, Fasten left          Lower body assist Assist for lower body dressing: (helper completed all, total assist)      Toileting Toileting     Toileting steps completed by helper: Adjust clothing prior to toileting, Performs perineal hygiene, Adjust clothing after toileting(per Linden Dolin, NT)    Toileting assist     Transfers Chair/bed transfer   Chair/bed transfer method: Other Chair/bed transfer assist level:  dependent (Pt equals 0%) Chair/bed transfer assistive device: Mechanical lift Mechanical lift: Teacher, music activity did not occur: Safety/medical Editor, commissioning activity did not occur: Safety/medical concerns(pt in TIS at this time) Type: Manual      Cognition Comprehension Comprehension assist level: Follows complex conversation/direction with extra time/assistive device  Expression Expression assist level: Expresses complex ideas: With extra time/assistive device  Social Interaction Social Interaction assist level: Interacts appropriately with others with medication or extra time (anti-anxiety, antidepressant).  Problem Solving Problem solving assist level: Solves complex 90% of the time/cues < 10% of the time  Memory Memory assist level: Recognizes or recalls 90% of the time/requires cueing < 10% of the time    Medical Problem List and Plan: 1.  Deficits with mobility, transfers, self-care secondary to Right BKA and wounds.   Continue CIR   Need to monitor closely for skin breakdown and signs of nonhealing   Appreciated vascular following 2.  DVT Prophylaxis/Anticoagulation: Pharmaceutical: Lovenox 3. Pain Management:    Fentanyl 25 mcg patch    IV dilaudid transitioned to oxycodone prn.   4. Mood: Offer Ego support.  5. Neuropsych: This patient is capable of making decisions on her own behalf. 6. Skin/Wound Care: has evidence of malnutrition. Has been refusing nephro as well as protein supplements. Encourage intake to help promote wound healing as well as issues with ascites.    Ischial  Decub: Air mattress overlay for pressure relief measures. Added protein supplement for malnutrition. Dakin's solution with damp to dry dressing. May need plastics consult.            Right groin wound 7. Fluids/Electrolytes/Nutrition: Renal diet with 1200 cc FR. Monitor daily weights.  8. Left Ischium decub with osteo: Daily local care with  saline dressing. Need to encourage intake of supplements to promote wound healing.     Per ID, Vanc/Ceftazidime x6 weeks after discussion 9. T2DM: Monitor BS ac/hs.    Continue Lantus 20 units in the a.m.   Lantus 14 units at bedtime and increase to 19 units on 2/1     SSI for elevated BS.     Continue to monitor 10. HTN: Monitor BP bid. Continue isosorbide bid, labetalol tid, clonidine tid and norvasc daily.    Hydralazine 10 3 times a day started on 1/30, increased to 25 on 1/31   Labile with hypertensive crisis overnight, patient to go to HD again today, will not make any adjustments 11. Pruritis: Question due to rise in phosphorous levels. Sarna tid with benadryl prn.  12. Ascites: Monitor weight daily.  Filed Weights   06/26/17 0700 06/26/17 1514 06/27/17 0500  Weight: 108 kg (238 lb 1.6 oz) 79.7 kg (175 lb 9.6 oz) 80 kg (176 lb 5.9 oz)    Incomplete tap on 1/29, removing 2 L, appreciate VIR 13. ESRD   Recs per  Nephro 14. Acute on chronic anemia  Hb 7.7 on 1/30   Cont to monitor   LOS (Days) 4 A FACE TO FACE EVALUATION WAS PERFORMED  Ankit Lorie Phenix 06/27/2017 9:44 AM

## 2017-06-27 NOTE — Progress Notes (Signed)
Speech Language Pathology Daily Session Note  Patient Details  Name: [name] MRN: [id number] Date of Birth: 1975-11-11  Today's Date: 06/27/2017 SLP Individual Time: 1105-1200 SLP Individual Time Calculation (min): 55 min  Short Term Goals: Week 1: SLP Short Term Goal 1 (Week 1): Pt will utilize external aids to facilitate recall of daily information with mod I.  SLP Short Term Goal 2 (Week 1): Pt will complete semi-complex tasks with mod I for functional problem solving.    Skilled Therapeutic Interventions:  Pt was seen for skilled ST targeting cognitive goals.  SLP facilitated the session with a novel scheduling task to address semi-complex problem solving.  Pt needed min assist verbal cues for task organization and working memory to complete task for 100% accuracy.  SLP also facilitated the session with a novel card game to address use of memory compensatory strategies; specifically word-picture associations.  Pt could generate word-picture associations for 75% accuracy with mod I and could recall associations for 100% accuracy with mod I.  Pt could recall specifically named category members from generative naming portion of task for 100% accuracy with mod I.  SLP discussed techniques for planning and task organization to maximize pt's safety with tasks.  SLP also discussed use of a memory notebook to facilitate recall of new information.  SLP demonstrated how to organize information into notebook to make it easy to find.  Pt was left in wheelchair with call bell within reach.  Continue per current plan of care.    Function:  Eating Eating   Modified Consistency Diet: No Eating Assist Level: No help, No cues           Cognition Comprehension Comprehension assist level: Follows complex conversation/direction with extra time/assistive device  Expression   Expression assist level: Expresses complex ideas: With extra time/assistive device  Social Interaction Social Interaction  assist level: Interacts appropriately with others with medication or extra time (anti-anxiety, antidepressant).  Problem Solving Problem solving assist level: Solves complex 90% of the time/cues < 10% of the time  Memory Memory assist level: Recognizes or recalls 90% of the time/requires cueing < 10% of the time    Pain Pain Assessment Pain Assessment: No/denies pain  Therapy/Group: Individual Therapy  Swetha Rayle, Selinda Orion 06/27/2017, 1:30 PM

## 2017-06-27 NOTE — Progress Notes (Signed)
Pharmacy Antibiotic Note  [name] is a 42 y.o. female with recent complicated hospital course 03/2017 discharged to SNF 1/16, but readmitted on the same day after fall out of bed and had possible small hemorrhagic mass on CT. She was being treated with vanc/zosyn for sacral osteo (1/17-1/23). And s/p RBKA on 1/22, and transferred to CIR on 1/28. Pharmacy is consulted to restart abx with vanc/fortaz per Dr. Johnnye Sima recommendation. Plan to treat for 6 weeks. She is afebrile, wbc 11.9K, current weight 80 kg (some variations on weight 70-80kg). HD MWF, next HD 2/1  Plan: Goal Pre-HD level 15-25 mcg/mL Vancomycin 750 mg IV Q HD Fortaz 1g IV Q 24 hrs F/u HD schedule Vancomycin pre-HD level after 3 doses.   Height: 6\' 4"  (193 cm) Weight: 176 lb 5.9 oz (80 kg) IBW/kg (Calculated) : 82.3  Temp (24hrs), Avg:97.6 F (36.4 C), Min:97.3 F (36.3 C), Max:97.9 F (36.6 C)  Recent Labs  Lab 06/23/17 0708 06/25/17 1420  WBC 9.9 11.9*  CREATININE 6.20* 5.66*    Estimated Creatinine Clearance: 16.5 mL/min (A) (by C-G formula based on SCr of 5.66 mg/dL (H)).    Allergies  Allergen Reactions  . Cephalexin Other (See Comments)    Reaction unknown- Childhood allergy Tolerated Ceftriaxone in the past  . Sulfamethoxazole-Trimethoprim Other (See Comments)    Unknown reaction. Pt states that she was told by her mother that she had allergy to Bactrim as a child.    Antimicrobials this admission: Tressie Ellis 1/29 >> Vancomycin 1/17>>1/23, 1/29 >> Zosyn 1/17>>1/23  Dose adjustments this admission:  Microbiology results: 1/17 BCx: neg 1/17 Peritoneal fluid: neg 1/17 MRSA PCR: negative 1/21 surgical PCR: MRSA neg, s.aureus pos 1/29 peritoneal fluid - ngtd  Thank you for allowing pharmacy to be a part of this patient's care.  Maryanna Shape, PharmD, BCPS  Clinical Pharmacist  Pager: [phone]   06/27/2017 2:56 PM

## 2017-06-27 NOTE — Progress Notes (Addendum)
[facility] Progress Note   Subjective:   Seen in dialysis Only c/o is some sacral pain and phantom pain in her BK extremity (says when she works with PT to stand feels like she is standing on her foot which "messes me up" No weight obtained pre HD  Objective Vitals:   06/27/17 1530 06/27/17 1600 06/27/17 1630 06/27/17 1700  BP: (!) 177/85 (!) 203/92 (!) 205/94 (!) 197/94  Pulse: 64 65 64 66  Resp:      Temp:      TempSrc:      SpO2:      Weight:      Height:       Physical Exam Frail but awake and alert ? mild L facial droop BP elevated Regular S1S2 No S3 Lungs clear Abd less tight tho still sig ascites + BS. Not tender R groin with wound VAC in place R BKA stump with 1+ edema No LLE edema Sacral wounds covered Marked muscle wasting throughout Dialysis Access: left AVF cannulated for HD    Recent Labs  Lab 06/23/17 0708 06/25/17 1420 06/27/17 1449  NA 134* 134* 133*  K 4.9 4.4 4.8  CL 92* 94* 94*  CO2 25 26 24   GLUCOSE 94 263* 200*  BUN 53* 46* 50*  CREATININE 6.20* 5.66* 5.92*  CALCIUM 8.6* 7.8* 7.4*  PHOS 7.4* 4.6 4.0    Recent Labs  Lab 06/23/17 0708 06/25/17 1420 06/27/17 1449  ALBUMIN 1.6* 1.6* 1.7*    Recent Labs  Lab 06/23/17 0708 06/25/17 1420 06/27/17 1449  WBC 9.9 11.9* 13.3*  HGB 7.7* 7.7* 7.8*  HCT 25.1* 24.9* 25.7*  MCV 90.6 91.2 89.5  PLT 267 265 274   Blood Culture    Component Value Date/Time   SDES FLUID PERITONEAL 06/24/2017 1539   SDES FLUID PERITONEAL 06/24/2017 1539   SPECREQUEST BOTTLES DRAWN AEROBIC AND ANAEROBIC 10CC 06/24/2017 1539   SPECREQUEST NONE 06/24/2017 1539   CULT  06/24/2017 1539    NO GROWTH 2 DAYS Performed at [facility] [address] [place], [place] [address]    REPTSTATUS PENDING 06/24/2017 1539   REPTSTATUS 06/25/2017 FINAL 06/24/2017 1539    Recent Labs  Lab 06/26/17 1140 06/26/17 1703 06/26/17 2036 06/27/17 0629 06/27/17 1146  GLUCAP 151* 195* 221* 229*  147*    Medications: . cefTAZidime (FORTAZ)  IV Stopped (06/26/17 1524)  . vancomycin 750 mg (06/25/17 1730)   . amLODipine  10 mg Oral QHS  . atorvastatin  40 mg Oral q1800  . cinacalcet  60 mg Oral Q supper  . cloNIDine  0.3 mg Oral TID  . darbepoetin (ARANESP) injection - DIALYSIS  200 mcg Intravenous Q Fri-HD  . docusate  100 mg Oral TID  . feeding supplement (NEPRO CARB STEADY)  237 mL Oral TID WC  . fentaNYL  25 mcg Transdermal Q72H  . ferric citrate  420 mg Oral TID WC  . gabapentin  100 mg Oral QHS  . heparin  5,000 Units Subcutaneous Q8H  . hydrALAZINE  25 mg Oral Q8H  . insulin aspart  0-9 Units Subcutaneous TID WC  . insulin glargine  19 Units Subcutaneous QHS  . insulin glargine  20 Units Subcutaneous Daily  . isosorbide dinitrate  60 mg Oral BID  . labetalol  300 mg Oral TID  . metoCLOPramide  2.5 mg Oral TID AC  . multivitamin  1 tablet Oral QHS  . pantoprazole  40 mg Oral Daily  .  protein supplement  1 scoop Oral TID WC  . sertraline  50 mg Oral Daily  . sevelamer carbonate  2,400 mg Oral TID WC   Dialysis Orders: MWF NW 4h 39min  400/800 85.5kg  2/2.25 bath  L AVF  P2  Hep 2000 Will be no heparin for time being 2.2 recent small intracranial bleed - Mircera 247mcg IV q 2wks - supposed to start 06/12/17  Assessment: 1. Sacral decub/osteo-Wound care;  Antibiotics resumed 1/29 for 6 week course (V/F) per ID. Bone bx not recommended (Dr. Johnnye Sima)  2. 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 VVS following 3. Poorly healing necrotic R groin wound - wound vac since1/23. VVS is following (stump and groin) 4. Fall/small left parietal IC bleed -MRI/cranial imaging "can be done as outpatient" per neurosurgery (seen once on day of admission by Dr. Christella Noa). Not exactly sure why we would wait since is to be here for so long anyway 5. Ascites- s/p paracentesis 1/17 (9.8L) and 1/29 (2L unable to lie on back so further fluid removal not accomplished)  No longer taut abd 6. ESRD-HDMWF  nw  - cannot HD in chair YET 2/2 pain 7. Anemiaof CKD-Hgb 7.7, Aranesp 23mcg IV q Friday 8. Secondary hyperparathyroidism- . Corrected Ca improved (lower) since resumption of sensipar. Phos improved on auryxia and sevelamer.   9. HTN, chronic/ severe/volume -Chronically elevated BP. KeepSBP>140on HD. Hydralazine dose just ^ to 80 10. Severe malnutrition- Alb 1.6, Renavite, Renal diet with fluid restrictions- intake variable -  added nepro/prostat and appears she is taking at least part of the time 11. CAD - per primary 12. Dispo - CIR another 3-4 weeks per notes. Very motivated. Will ultimately need to be able to sit in recliner for her treatment. Reports sacral decub more painful, says would not be able to sit it the recliner for HD with current pain level but hopeful this will improve.   Jamal Maes, MD [facility] [phone] Pager 06/24/2017, 10:33 AM

## 2017-06-28 ENCOUNTER — Inpatient Hospital Stay (HOSPITAL_COMMUNITY): Payer: Medicare Other | Admitting: Occupational Therapy

## 2017-06-28 ENCOUNTER — Inpatient Hospital Stay (HOSPITAL_COMMUNITY): Payer: Medicare Other | Admitting: Physical Therapy

## 2017-06-28 DIAGNOSIS — S88111S Complete traumatic amputation at level between knee and ankle, right lower leg, sequela: Secondary | ICD-10-CM

## 2017-06-28 LAB — GLUCOSE, CAPILLARY
GLUCOSE-CAPILLARY: 80 mg/dL (ref 65–99)
GLUCOSE-CAPILLARY: 79 mg/dL (ref 65–99)
Glucose-Capillary: 224 mg/dL — ABNORMAL HIGH (ref 65–99)

## 2017-06-28 MED ORDER — INSULIN GLARGINE 100 UNIT/ML ~~LOC~~ SOLN
22.0000 [IU] | Freq: Every day | SUBCUTANEOUS | Status: DC
Start: 2017-06-28 — End: 2017-06-30
  Administered 2017-06-29: 22 [IU] via SUBCUTANEOUS
  Filled 2017-06-28 (×2): qty 0.22

## 2017-06-28 NOTE — Progress Notes (Signed)
[facility] Progress Note   Subjective:   Eating breakfast Says back side hurts, only c/o at this time No weight done as requested yesterday Have asked that RN please try to zero the bed  Objective Vitals:   06/27/17 1944 06/27/17 2134 06/28/17 0300 06/28/17 0845  BP: (!) 201/83 (!) 187/80 (!) 197/79 (!) 175/80  Pulse: 70 74 73 71  Resp: 18  20   Temp: 97.7 F (36.5 C)  98.2 F (36.8 C)   TempSrc: Oral  Oral   SpO2: 98%  100%   Weight:      Height:       Physical Exam Frail but awake and alert, ready to eat VS as noted Regular S1S2 No S3 Lungs clear Abd tight but not taut. Umbilical hernia. + BS. Not tender R groin with wound VAC in place R BKA stump with 1+ edema shrinker on No LLE edema Sacral wounds covered Marked muscle wasting throughout Dialysis Access: left AVF + bruit  Recent Labs  Lab 06/23/17 0708 06/25/17 1420 06/27/17 1449  NA 134* 134* 133*  K 4.9 4.4 4.8  CL 92* 94* 94*  CO2 25 26 24   GLUCOSE 94 263* 200*  BUN 53* 46* 50*  CREATININE 6.20* 5.66* 5.92*  CALCIUM 8.6* 7.8* 7.4*  PHOS 7.4* 4.6 4.0    Recent Labs  Lab 06/23/17 0708 06/25/17 1420 06/27/17 1449  ALBUMIN 1.6* 1.6* 1.7*    Recent Labs  Lab 06/23/17 0708 06/25/17 1420 06/27/17 1449  WBC 9.9 11.9* 13.3*  HGB 7.7* 7.7* 7.8*  HCT 25.1* 24.9* 25.7*  MCV 90.6 91.2 89.5  PLT 267 265 274   Blood Culture    Component Value Date/Time   SDES FLUID PERITONEAL 06/24/2017 1539   SDES FLUID PERITONEAL 06/24/2017 1539   SPECREQUEST BOTTLES DRAWN AEROBIC AND ANAEROBIC 10CC 06/24/2017 1539   SPECREQUEST NONE 06/24/2017 1539   CULT  06/24/2017 1539    NO GROWTH 3 DAYS Performed at [facility], [place] [address] [place], [place] [address]    REPTSTATUS PENDING 06/24/2017 1539   REPTSTATUS 06/25/2017 FINAL 06/24/2017 1539    Recent Labs  Lab 06/26/17 2036 06/27/17 0629 06/27/17 1146 06/27/17 2136 06/28/17 0643  GLUCAP 221* 229* 147* 281* 224*     Medications: . cefTAZidime (FORTAZ)  IV Stopped (06/27/17 1406)  . vancomycin Stopped (06/27/17 2044)   . amLODipine  10 mg Oral QHS  . atorvastatin  40 mg Oral q1800  . cinacalcet  60 mg Oral Q supper  . cloNIDine  0.3 mg Oral TID  . darbepoetin (ARANESP) injection - DIALYSIS  200 mcg Intravenous Q Fri-HD  . docusate  100 mg Oral TID  . feeding supplement (NEPRO CARB STEADY)  237 mL Oral TID WC  . fentaNYL  25 mcg Transdermal Q72H  . ferric citrate  420 mg Oral TID WC  . gabapentin  100 mg Oral QHS  . heparin  5,000 Units Subcutaneous Q8H  . hydrALAZINE  25 mg Oral Q8H  . insulin aspart  0-9 Units Subcutaneous TID WC  . insulin glargine  20 Units Subcutaneous Daily  . insulin glargine  22 Units Subcutaneous QHS  . isosorbide dinitrate  60 mg Oral BID  . labetalol  300 mg Oral TID  . metoCLOPramide  2.5 mg Oral TID AC  . multivitamin  1 tablet Oral QHS  . pantoprazole  40 mg Oral Daily  . protein supplement  1 scoop Oral TID WC  . sertraline  50 mg Oral Daily  . sevelamer carbonate  2,400 mg Oral TID WC   Dialysis Orders: MWF  NW 4h 76min  400/800 85.5kg  2/2.25 bath  L AVF  P2  Hep 2000 Will be no heparin for time being 2.2 recent small intracranial bleed - Mircera 232mcg IV q 2wks - supposed to start 06/12/17  Assessment: 1. Sacral decub/osteo-Wound care;  Antibiotics resumed 1/29 for 6 week course (V/F) per ID. Bone bx not recommended (Dr. Johnnye Sima)  2. 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 VVS following 3. Poorly healing necrotic R groin wound - wound vac since1/23. VVS is following (stump and groin) 4. Fall/small left parietal IC bleed -MRI/cranial imaging "can be done as outpatient" per neurosurgery (seen once on day of admission by Dr. Christella Noa). Not exactly sure why we would wait since is to be here for so long anyway 5. Ascites- s/p paracentesis 1/17 (9.8L) and 1/29 (2L unable to lie on back so further fluid removal not accomplished)  No longer taut  6. ESRD-HDMWF NW   - cannot HD in chair YET 2/2 pain 7. Anemiaof CKD-Hgb 7.7, Aranesp 237mcg IV q Friday 8. Secondary hyperparathyroidism- . Corrected Ca improved (lower) since resumption of sensipar. Phos improved on auryxia and sevelamer.   9. HTN, chronic/ severe/volume -Chronically elevated BP. KeepSBP>140on HD. Hydralazine dose just ^ to 70 10. Severe malnutrition- Renavite, Renal diet with fluid restrictions- intake variable -  added nepro/prostat and appears she is taking at least part of the time. Alb <2 11. CAD - per primary 12. DM rehab docs managing 13. Dispo - CIR another 3-4 weeks per notes. Very motivated. Will ultimately need to be able to sit in recliner for her treatment. Reports sacral decub more painful, says would not be able to sit it the recliner for HD with current pain level but hopeful this will improve.   Jamal Maes, MD [facility] [phone] Pager 06/24/2017, 10:33 AM

## 2017-06-28 NOTE — Progress Notes (Signed)
Physical Therapy Session Note  Patient Details  Name: [name] MRN: [id number] Date of Birth: 1976-04-03  Today's Date: 06/28/2017 PT Individual Time: 0900-0958 PT Individual Time Calculation (min): 58 min   Short Term Goals: Week 1:  PT Short Term Goal 1 (Week 1): pt will tolerate sitting in w/c x 1 hour, 3 x a day PT Short Term Goal 2 (Week 1): pt will perform functional transfers without use of lift equipment with +2 assist  Skilled Therapeutic Interventions/Progress Updates:  Pt was seen bedside in the am. Pt transferred supine to edge of bed with side rail and mod A with verbal cues. Pt transferred edge of bed to w/c with sliding board and mod A with verbal cues. Pt transported to gym by rehab tech. In gym treatment focused on R knee strengthening and ROM. Performed R knee flex/ext and quad sets, 3 sets x 10 reps each. Pt requesting to get back in bed at end of treatment secondary to pain. Pt returned to room. Pt transferred w/c to edge of bed with sliding board and min A with verbal cues. Pt transferred edge of bed to supine with side rail and S. Donned KI R BKA to encourage extension. Pt left sitting up in bed with call bell within reach.   Therapy Documentation Precautions:  Precautions Precautions: Fall Precaution Comments: wound vac from groin, sacral decubital wound  Restrictions Weight Bearing Restrictions: No RLE Weight Bearing: Non weight bearing General:   Pain: Pt c/o 7/10 pain R BKA.   See Function Navigator for Current Functional Status.   Therapy/Group: Individual Therapy  Dub Amis 06/28/2017, 12:40 PM

## 2017-06-28 NOTE — Progress Notes (Signed)
Occupational Therapy Session Note  Patient Details  Name: [name] MRN: [id number] Date of Birth: November 27, 1975  Today's Date: 06/28/2017 OT Missed Time: 80 Minutes for the first visit Missed Time Reason: Pain;Patient fatigue   Treatment Time second visit: 1400-1430 for 30 min    Short Term Goals: Week 1:  OT Short Term Goal 1 (Week 1): Pt will tolerate sitting EOB for 10 min to engage in sitting bathing and dressing. OT Short Term Goal 2 (Week 1): Pt will be able to loop pants over feet with min A. OT Short Term Goal 3 (Week 1): Pt will be able to pull pants over hips with lateral leans with min A. OT Short Term Goal 4 (Week 1): Pt will tolerate standing in [place] lift for 2-3 minutes at a time to develop standing tolerance.  OT Short Term Goal 5 (Week 1): Pt will be able to tolerate sitting on a padded BSC for 5-10 min.   Skilled Therapeutic Interventions/Progress Updates:  Visit 1:  Pt received in bed sleeping. She did wake to tell me that she did not realize she had a therapy session right now as she had just taken a pain pill that was making her very sleepy. She was extremely tired from earlier PT session.  She declined therapy this session. Pt is scheduled for another session this afternoon.  Will check with her then.  Visit 2: Pt received in bed sleeping but was able to wake and agreeable to trying therapy. She sat to EOB with min A and immediately had pain in residual limb.  Set up a BSC next to bed to see if pt could tolerate sitting on it. Pt began to do her slide board to [facility] when her pain in limb exacerbated so much that she could not tolerate sitting. Pt layed back down.  Pt worked on LE exercises of AROM of R limb with knee extension, leg abd/add of hip, int/ ext rotation of leg.  Hip and back stretches with pulling one knee into chest at a time with extending other leg long. Pt was able to tolerate laying flat on her back for 10 min and then rolled onto her L side to rest. Pt was  very tired at this point.  Pt left in bed with all needs met.      Therapy Documentation Precautions:  Precautions Precautions: Fall Precaution Comments: wound vac from groin, sacral decubital wound  Restrictions Weight Bearing Restrictions: Yes RLE Weight Bearing: Non weight bearing  Pain: Pain Assessment Pain Assessment: 0-10 Pain Score: Asleep Pain Type: Surgical pain Pain Location: Leg Pain Orientation: Right Pain Descriptors / Indicators: Aching Pain Frequency: Constant Patients Stated Pain Goal: 4 Pain Intervention(s): Medication (See eMAR) ADL: ADL ADL Comments: see functional naviagtor  See Function Navigator for Current Functional Status.   Therapy/Group: Individual Therapy  [place] 06/28/2017, 8:03 AM

## 2017-06-28 NOTE — Progress Notes (Signed)
[facility] PHYSICAL MEDICINE & REHABILITATION     PROGRESS NOTE  Subjective/Complaints:  Slept well, no pain issues today  ROS: Denies CP, SOB, nausea, vomiting, diarrhea.  Objective: Vital Signs: Blood pressure (!) 197/79, pulse 73, temperature 98.2 F (36.8 C), temperature source Oral, resp. rate 20, height 6\' 4"  (1.93 m), weight 78 kg (171 lb 15.3 oz), last menstrual period 10/09/2015, SpO2 100 %. No results found. Recent Labs    06/25/17 1420 06/27/17 1449  WBC 11.9* 13.3*  HGB 7.7* 7.8*  HCT 24.9* 25.7*  PLT 265 274   Recent Labs    06/25/17 1420 06/27/17 1449  NA 134* 133*  K 4.4 4.8  CL 94* 94*  GLUCOSE 263* 200*  BUN 46* 50*  CREATININE 5.66* 5.92*  CALCIUM 7.8* 7.4*   CBG (last 3)  Recent Labs    06/27/17 1146 06/27/17 2136 06/28/17 0643  GLUCAP 147* 281* 224*    Wt Readings from Last 3 Encounters:  06/27/17 78 kg (171 lb 15.3 oz)  06/23/17 76.8 kg (169 lb 6.4 oz)  06/11/17 83.5 kg (184 lb 1.3 oz)    Physical Exam:  BP (!) 197/79 (BP Location: Right Arm)   Pulse 73   Temp 98.2 F (36.8 C) (Oral)   Resp 20   Ht 6\' 4"  (1.93 m)   Wt 78 kg (171 lb 15.3 oz)   LMP 10/09/2015 Comment: pt on dialysis  SpO2 100%   BMI 20.93 kg/m  Constitutional: She has a sickly appearance. No distress.  HENT: Normocephalic and atraumatic.  Eyes:  and EOM are normal. No discharge.  Cardiovascular: RRR. No JVD. Respiratory: Effort normal and breath sounds normal.  GI: Bowel sounds are normal. She exhibits distension. Ascites.  Musculoskeletal: She exhibits edema.  Neurological: She is alert and oriented.  Motor: 3+/5 B/l UE, LLE, RLE HF (unchanged) Skin: Skin is warm and dry. She is not diaphoretic. Right groin wound C/D/I, not examined today Ischial Ulcer, not examined today BKA with dressing C/D/I  Psychiatric: Her speech is normal. Her mood appears anxious. She expresses impulsivity. She is inattentive.   Assessment/Plan: 1. Functional deficits secondary  to Right BKA and wounds which require 3+ hours per day of interdisciplinary therapy in a comprehensive inpatient rehab setting. Physiatrist is providing close team supervision and 24 hour management of active medical problems listed below. Physiatrist and rehab team continue to assess barriers to discharge/monitor patient progress toward functional and medical goals.  Function:  Bathing Bathing position   Position: Bed  Bathing parts Body parts bathed by patient: Right arm, Left arm, Chest, Abdomen Body parts bathed by helper: Front perineal area, Buttocks, Right upper leg, Left upper leg, Right lower leg, Left lower leg  Bathing assist Assist Level: (40%, maximal assist level)      Upper Body Dressing/Undressing Upper body dressing   What is the patient wearing?: Pull over shirt/dress     Pull over shirt/dress - Perfomed by patient: Thread/unthread right sleeve, Thread/unthread left sleeve, Put head through opening Pull over shirt/dress - Perfomed by helper: Pull shirt over trunk        Upper body assist Assist Level: Touching or steadying assistance(Pt > 75%)      Lower Body Dressing/Undressing Lower body dressing   What is the patient wearing?: Pants, Socks, Shoes       Pants- Performed by helper: Thread/unthread right pants leg, Thread/unthread left pants leg, Pull pants up/down       Socks - Performed by helper: Don/doff  right sock, Don/doff left sock   Shoes - Performed by helper: Don/doff right shoe, Don/doff left shoe, Fasten right, Fasten left          Lower body assist Assist for lower body dressing: (helper completed all, total assist)      Toileting Toileting     Toileting steps completed by helper: Adjust clothing prior to toileting, Performs perineal hygiene, Adjust clothing after toileting(per Linden Dolin, NT)    Toileting assist     Transfers Chair/bed transfer   Chair/bed transfer method: Lateral scoot Chair/bed transfer assist level:  Touching or steadying assistance (Pt > 75%) Chair/bed transfer assistive device: Sliding board Mechanical lift: Primary school teacher Ambulation activity did not occur: Safety/medical Editor, commissioning activity did not occur: Safety/medical concerns(pt in TIS at this time) Type: Manual      Cognition Comprehension Comprehension assist level: Follows complex conversation/direction with extra time/assistive device  Expression Expression assist level: Expresses complex ideas: With extra time/assistive device  Social Interaction Social Interaction assist level: Interacts appropriately with others with medication or extra time (anti-anxiety, antidepressant).  Problem Solving Problem solving assist level: Solves complex 90% of the time/cues < 10% of the time  Memory Memory assist level: Recognizes or recalls 90% of the time/requires cueing < 10% of the time    Medical Problem List and Plan: 1.  Deficits with mobility, transfers, self-care secondary to Right BKA and wounds.   Continue CIR, PT OT speech   Need to monitor closely for skin breakdown and signs of nonhealing, vascular following    2.  DVT Prophylaxis/Anticoagulation: Pharmaceutical: Lovenox 3. Pain Management:    Fentanyl 25 mcg patch    IV dilaudid transitioned to oxycodone prn.   4. Mood: Offer Ego support.  5. Neuropsych: This patient is capable of making decisions on her own behalf. 6. Skin/Wound Care: has evidence of malnutrition. Has been refusing nephro as well as protein supplements. Encourage intake to help promote wound healing as well as issues with ascites.    Ischial  Decub: Air mattress overlay for pressure relief measures. Added protein supplement for malnutrition. Dakin's solution with damp to dry dressing. May need plastics consult.            Right groin wound 7. Fluids/Electrolytes/Nutrition: Renal diet with 1200 cc FR. Monitor daily weights.  8. Left Ischium decub with osteo:  Daily local care with saline dressing. Need to encourage intake of supplements to promote wound healing.     Per ID, Vanc/Ceftazidime x6 weeks after discussion 9. T2DM: Monitor BS ac/hs.    Continue Lantus 20 units in the a.m.   Lantus 14 units at bedtime and increase to 19 units on 2/1     SSI for elevated BS.      CBG (last 3)  Recent Labs    06/27/17 1146 06/27/17 2136 06/28/17 0643  GLUCAP 147* 281* 224*  Will increase p.m. Lantus 10. HTN: Monitor BP bid. Continue isosorbide bid, labetalol tid, clonidine tid and norvasc daily.    Hydralazine 10 3 times a day started on 1/30, increased to 25 on 1/31   Will check orthostatic vitals prior to any further med adjustments 11. Pruritis: Question due to rise in phosphorous levels. Sarna tid with benadryl prn.  12. Ascites: Monitor weight daily.  Filed Weights   06/27/17 0500 06/27/17 1435 06/27/17 1900  Weight: 80 kg (176 lb 5.9 oz) 80 kg (176 lb 5.9 oz) 78 kg (  171 lb 15.3 oz)    Incomplete tap on 1/29, removing 2 L, appreciate VIR 13. ESRD   Recs per Nephro 14. Acute on chronic anemia  Hb 7.7 on 1/30   Cont to monitor   LOS (Days) 5 A FACE TO FACE EVALUATION WAS PERFORMED  Charlett Blake 06/28/2017 8:15 AM

## 2017-06-29 ENCOUNTER — Inpatient Hospital Stay (HOSPITAL_COMMUNITY): Payer: Medicare Other | Admitting: Physical Therapy

## 2017-06-29 ENCOUNTER — Inpatient Hospital Stay (HOSPITAL_COMMUNITY): Payer: Medicare Other | Admitting: Occupational Therapy

## 2017-06-29 LAB — GLUCOSE, CAPILLARY
Glucose-Capillary: 96 mg/dL (ref 65–99)
Glucose-Capillary: 159 mg/dL — ABNORMAL HIGH (ref 65–99)
Glucose-Capillary: 186 mg/dL — ABNORMAL HIGH (ref 65–99)
Glucose-Capillary: 153 mg/dL — ABNORMAL HIGH (ref 65–99)

## 2017-06-29 NOTE — Progress Notes (Signed)
Physical Therapy Session Note  Patient Details  Name: [name] MRN: [id number] Date of Birth: [birth year]  Today's Date: 06/29/2017 PT Individual Time: 0800-0900 PT Individual Time Calculation (min): 60 min   Short Term Goals: Week 1:  PT Short Term Goal 1 (Week 1): pt will tolerate sitting in w/c x 1 hour, 3 x a day PT Short Term Goal 2 (Week 1): pt will perform functional transfers without use of lift equipment with +2 assist  Skilled Therapeutic Interventions/Progress Updates:  Pt was seen bedside in the pm. Pt transferred supine to edge of bed with side rail and mod A with verbal cues. Pt tolerated edge of bed about 5 minutes with S. Pt transferred edge of bed to w/c with sliding board and min A with verbal cues. In gym, pt worked on standing in parallel bars. Pt attempted to stand x 5, pt stood x 3 with max A and verbal cues, tolerated standing about 15 to 20 seconds each time. Pt performed quad sets and knee flex/ext R LE 3 sets x 10 reps each. Pt returned to room following treatment. Pt left sitting up in w/c with all needs within [name].   Therapy Documentation Precautions:  Precautions Precautions: Fall Precaution Comments: wound vac from groin, sacral decubital wound  Restrictions Weight Bearing Restrictions: No RLE Weight Bearing: Non weight bearing General:   Vital Signs:  Pain: Pt c/o 5/10 pain R BKA.   See Function Navigator for Current Functional Status.   Therapy/Group: Individual Therapy  Dub Amis 06/29/2017, 7:59 AM

## 2017-06-29 NOTE — Progress Notes (Signed)
[facility] Progress Note   Subjective:   No real changes  Says back side hurts, only c/o at this time  No weight done with last HD Have asked that RN please try to zero the bed  Refusing Nepro and prostat so have stopped (says no way she will take)  Objective Vitals:   06/28/17 2023 06/28/17 2227 06/29/17 0035 06/29/17 0920  BP: (!) 188/83 (!) 176/81 (!) 168/68 (!) 163/70  Pulse: 69  71 75  Resp:   18   Temp:   98.6 F (37 C)   TempSrc:   Oral   SpO2:      Weight:   82 kg (180 lb 12.4 oz)   Height:       Physical Exam Frail but awake and alert Lying on her side VS as noted Regular S1S2 No S3 Lungs clear Abd tight but not taut (ascites) Umbilical hernia.  + BS. Not tender R groin wound not examined R BKA stump with 1+ edema shrinker on and in immobilizer No LLE edema Sacral wounds covered not examined Marked muscle wasting throughout Dialysis Access: left AVF + bruit  Recent Labs  Lab 06/23/17 0708 06/25/17 1420 06/27/17 1449  NA 134* 134* 133*  K 4.9 4.4 4.8  CL 92* 94* 94*  CO2 25 26 24   GLUCOSE 94 263* 200*  BUN 53* 46* 50*  CREATININE 6.20* 5.66* 5.92*  CALCIUM 8.6* 7.8* 7.4*  PHOS 7.4* 4.6 4.0    Recent Labs  Lab 06/23/17 0708 06/25/17 1420 06/27/17 1449  ALBUMIN 1.6* 1.6* 1.7*    Recent Labs  Lab 06/23/17 0708 06/25/17 1420 06/27/17 1449  WBC 9.9 11.9* 13.3*  HGB 7.7* 7.7* 7.8*  HCT 25.1* 24.9* 25.7*  MCV 90.6 91.2 89.5  PLT 267 265 274   Blood Culture    Component Value Date/Time   SDES FLUID PERITONEAL 06/24/2017 1539   SDES FLUID PERITONEAL 06/24/2017 1539   SPECREQUEST BOTTLES DRAWN AEROBIC AND ANAEROBIC 10CC 06/24/2017 1539   SPECREQUEST NONE 06/24/2017 1539   CULT  06/24/2017 1539    NO GROWTH 3 DAYS Performed at [facility] [address], [place], [place] [id number]    REPTSTATUS PENDING 06/24/2017 1539   REPTSTATUS 06/25/2017 FINAL 06/24/2017 1539    Recent Labs  Lab 06/27/17 2136  06/28/17 0643 06/28/17 1148 06/28/17 2059 06/29/17 0636  GLUCAP 281* 224* 80 79 96    Medications: . cefTAZidime (FORTAZ)  IV Stopped (06/28/17 1530)  . vancomycin Stopped (06/27/17 2044)   . amLODipine  10 mg Oral QHS  . atorvastatin  40 mg Oral q1800  . cinacalcet  60 mg Oral Q supper  . cloNIDine  0.3 mg Oral TID  . darbepoetin (ARANESP) injection - DIALYSIS  200 mcg Intravenous Q Fri-HD  . docusate  100 mg Oral TID  . feeding supplement (NEPRO CARB STEADY)  237 mL Oral TID WC  . fentaNYL  25 mcg Transdermal Q72H  . ferric citrate  420 mg Oral TID WC  . gabapentin  100 mg Oral QHS  . heparin  5,000 Units Subcutaneous Q8H  . hydrALAZINE  25 mg Oral Q8H  . insulin aspart  0-9 Units Subcutaneous TID WC  . insulin glargine  20 Units Subcutaneous Daily  . insulin glargine  22 Units Subcutaneous QHS  . isosorbide dinitrate  60 mg Oral BID  . labetalol  300 mg Oral TID  . metoCLOPramide  2.5 mg Oral TID AC  . multivitamin  1 tablet Oral QHS  . pantoprazole  40 mg Oral Daily  . protein supplement  1 scoop Oral TID WC  . sertraline  50 mg Oral Daily  . sevelamer carbonate  2,400 mg Oral TID WC   Dialysis Orders: MWF  NW 4h 3min  400/800 85.5kg  2/2.25 bath  L AVF  P2  Hep 2000 Will be no heparin for time being 2.2 recent small intracranial bleed - Mircera 217mcg IV q 2wks  Assessment: 1. Sacral decub/osteo-Wound care;  Antibiotics resumed 1/29 for 6 week course (V/F) per ID. Bone bx not recommended (Dr. Johnnye Sima)  2. 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 VVS following 3. Poorly healing R groin wound  VVS following 4. Fall/small left parietal IC bleed -MRI/cranial imaging "can be done as outpatient" per neurosurgery (seen once on day of admission by Dr. Christella Noa). Not exactly sure why we would wait since is to be here for so long anyway 5. Ascites- s/p paracentesis 1/17 (9.8L) and 1/29 (2L unable to lie on back so further fluid removal not accomplished)  No longer taut but still sig  ascites  6. ESRD-HDMWF NW  - cannot HD in chair YET 2/2 pain 7. Anemiaof CKD Aranesp 243mcg IV q Friday. Transfuse for <7 ( 8. Secondary hyperparathyroidism- . Corrected Ca improved (lower) since resumption of sensipar. Phos improved on auryxia and sevelamer.   9. HTN, chronic/ severe/volume -Chronically elevated BP. KeepSBP>140on HD. Hydralazine dose  ^ to 25 this week 10. Severe malnutrition- Renavite, Renal diet with fluid restrictions- intake variable -  added nepro/prostat but now refusing all and says no way will take so rem'd from med list.  11. CAD - per primary 12. DM rehab docs managing 13. Dispo - CIR another 3-4 weeks per notes. Very motivated. Will ultimately need to be able to sit in recliner for her treatment. Reports sacral decub more painful, says would not be able to sit it the recliner for HD with current pain level but hopeful this will improve. Nutrition an issue. Refusing all supplements.  Jamal Maes, MD [facility] [phone] Pager 06/24/2017, 10:33 AM

## 2017-06-29 NOTE — Progress Notes (Signed)
Occupational Therapy Session Note  Patient Details  Name: [name] MRN: [id number] Date of Birth: 09/04/1975  Today's Date: 06/29/2017 OT Individual Time: 1005-1103 and [id number] OT Individual Time Calculation (min): 58 min and 33 min  Short Term Goals: Week 1:  OT Short Term Goal 1 (Week 1): Pt will tolerate sitting EOB for 10 min to engage in sitting bathing and dressing. OT Short Term Goal 2 (Week 1): Pt will be able to loop pants over feet with min A. OT Short Term Goal 3 (Week 1): Pt will be able to pull pants over hips with lateral leans with min A. OT Short Term Goal 4 (Week 1): Pt will tolerate standing in [place] lift for 2-3 minutes at a time to develop standing tolerance.  OT Short Term Goal 5 (Week 1): Pt will be able to tolerate sitting on a padded BSC for 5-10 min.   Skilled Therapeutic Interventions/Progress Updates:    Pt greeted sidelying in bed, having BM in brief and declining to get OOB for [facility] transfer. Pt voided bowels, and then rolled with Min A Rt<Lt for OT to complete pericare and don clean brief. Pt assisting with lowering/lifting pants pre and post voiding. Mod A for supine<sit afterwards, with pt verbalizing she had to move bowels again. Refusing to sit on [facility] in room. While NT supervised pt EOB, OT retrieved padded BSC, however upon arrival pt reporting that she could not wait any longer and initiated transfer back to sidelying to void bowels. Perihygiene and clothing mgt completed again in manner as written above. Afterwards pt was agreeable to practice simulated transfer to padded BSC. Min A slideboard transfer to [facility] with pt reporting pain in Lt hip/general sacral area. OT provided pt with 3 positional modifications to ease pain with pt reporting that 2 methods helped slightly but did not provide enough relief. Pt then reported needing to return to bed, slideboard<bed completed with Mod A. Then pt transitioned back to sidelying with pillow between legs. Discussed  with pt mindfulness techniques to assist with pain mgt, emphasis placed on neurological mechanism of how emotional states heighten/dampen pain perception. Pt appeared receptive to education. OT-pt collaboration completed in regards to additional modifications to try for practicing [facility] transfer during PM session. At this time, pt still wants to keep voiding in brief while in bed as this is the least painful position for her. Pt left in sidelying at session exit with all needs within reach and KI donned.   2nd Session 1:1 tx (33 min) Pt greeted supine in bed. Agreeable to tx. Continued slideboard transfer practice to padded BSC with Min A. Found least adversive sitting position to be slightly leaning forward on knees. Educated pt that lying in soiled brief contributes to breakdown of skin and ultimately impedes healing of sacral/groin wounds. She appeared surprised by this education and agreed to try to void on padded tub bench with nursing staff and therapists this week. Updated safety plan to reflect this. Afterwards pt transferred to [facility] and engaged in pressure relief exercises including prolonged lateral and forward weight shifts (1 minute holds). Also completed w/c push ups for tricep strengthening as well as pressure relief. Pt unable to clear buttocks off of chair due to UE weakness, however able to off-load pressure a bit. At end of tx pt was left in TIS with KI donned and all needs within reach.   Therapy Documentation Precautions:  Precautions Precautions: Fall Precaution Comments: wound vac from groin, sacral decubital wound  Restrictions Weight Bearing Restrictions: No RLE Weight Bearing: Non weight bearing Pain: Lt hip and sacral area. RN made aware   ADL: ADL ADL Comments: see functional naviagtor    See Function Navigator for Current Functional Status.   Therapy/Group: Individual Therapy  Jaina Morin A Jenniffer Vessels 06/29/2017, 11:45 AM

## 2017-06-29 NOTE — Progress Notes (Addendum)
[place] [facility]IC[facility] & REHABILITATION     PROGRESS NOTE  Subjective/Complaints:  Slept well, no pain issues today, discussed refusal of subQ heparin and risk of PE  ROS: Denies CP, SOB, nausea, vomiting, diarrhea.  Objective: Vital Signs: Blood pressure (!) 168/68, pulse 71, temperature 98.6 F (37 C), temperature source Oral, resp. rate 18, height 6\' 4"  (1.93 m), weight 82 kg (180 lb 12.4 oz), last menstrual period 10/09/2015, SpO2 100 %. No results found. Recent Labs    06/27/17 1449  WBC 13.3*  HGB 7.8*  HCT 25.7*  PLT 274   Recent Labs    06/27/17 1449  NA 133*  K 4.8  CL 94*  GLUCOSE 200*  BUN 50*  CREATININE 5.92*  CALCIUM 7.4*   CBG (last 3)  Recent Labs    06/28/17 1148 06/28/17 2059 06/29/17 0636  GLUCAP 80 79 96    Wt Readings from Last 3 Encounters:  06/29/17 82 kg (180 lb 12.4 oz)  06/23/17 76.8 kg (169 lb 6.4 oz)  06/11/17 83.5 kg (184 lb 1.3 oz)    Physical Exam:  BP (!) 168/68 (BP Location: Right Arm)   Pulse 71   Temp 98.6 F (37 C) (Oral)   Resp 18   Ht 6\' 4"  (1.93 m)   Wt 82 kg (180 lb 12.4 oz)   LMP 10/09/2015 Comment: pt on dialysis  SpO2 100%   BMI 22.00 kg/m  Constitutional: She has a sickly appearance. No distress.  HENT: Normocephalic and atraumatic.  Eyes:  and EOM are normal. No discharge.  Cardiovascular: RRR. No JVD. Respiratory: Effort normal and breath sounds normal.  GI: Bowel sounds are normal. She exhibits distension. Ascites.  Musculoskeletal: She exhibits edema.  Neurological: She is alert and oriented.  Motor: 3+/5 B/l UE, LLE, RLE HF (unchanged) Skin: Skin is warm and dry. She is not diaphoretic. Right groin wound C/D/I, not examined today Ischial Ulcer, not examined today BKA with dressing C/D/I  Psychiatric: Her speech is normal. Her mood appears anxious. She expresses impulsivity. She is inattentive.   Assessment/Plan: 1. Functional deficits secondary to Right BKA and wounds which require 3+  hours per day of interdisciplinary therapy in a comprehensive inpatient rehab setting. Physiatrist is providing close team supervision and 24 hour management of active medical problems listed below. Physiatrist and rehab team continue to assess barriers to discharge/monitor patient progress toward functional and medical goals.  Function:  Bathing Bathing position   Position: Bed  Bathing parts Body parts bathed by patient: Right arm, Left arm, Chest, Abdomen Body parts bathed by helper: Front perineal area, Buttocks, Right upper leg, Left upper leg, Right lower leg, Left lower leg  Bathing assist Assist Level: (40%, maximal assist level)      Upper Body Dressing/Undressing Upper body dressing   What is the patient wearing?: Pull over shirt/dress     Pull over shirt/dress - Perfomed by patient: Thread/unthread right sleeve, Thread/unthread left sleeve, Put head through opening Pull over shirt/dress - Perfomed by helper: Pull shirt over trunk        Upper body assist Assist Level: Touching or steadying assistance(Pt > 75%)      Lower Body Dressing/Undressing Lower body dressing   What is the patient wearing?: Pants, Socks, Shoes       Pants- Performed by helper: Thread/unthread right pants leg, Thread/unthread left pants leg, Pull pants up/down       Socks - Performed by helper: Don/doff right sock, Don/doff left sock  Shoes - Performed by helper: Don/doff right shoe, Don/doff left shoe, Fasten right, Fasten left          Lower body assist Assist for lower body dressing: (helper completed all, total assist)      Toileting Toileting     Toileting steps completed by helper: Adjust clothing prior to toileting, Performs perineal hygiene, Adjust clothing after toileting(per Linden Dolin, NT)    Toileting assist     Transfers Chair/bed transfer   Chair/bed transfer method: Lateral scoot Chair/bed transfer assist level: Touching or steadying assistance (Pt >  75%) Chair/bed transfer assistive device: Sliding board Mechanical lift: Primary school teacher Ambulation activity did not occur: Safety/medical Editor, commissioning activity did not occur: Safety/medical concerns(pt in TIS at this time) Type: Manual      Cognition Comprehension Comprehension assist level: Follows complex conversation/direction with extra time/assistive device  Expression Expression assist level: Expresses complex ideas: With extra time/assistive device  Social Interaction Social Interaction assist level: Interacts appropriately with others with medication or extra time (anti-anxiety, antidepressant).  Problem Solving Problem solving assist level: Solves complex 90% of the time/cues < 10% of the time  Memory Memory assist level: Recognizes or recalls 90% of the time/requires cueing < 10% of the time    Medical Problem List and Plan: 1.  Deficits with mobility, transfers, self-care secondary to Right BKA and wounds.   Continue CIR, PT OT speech       2.  DVT Prophylaxis/Anticoagulation: Pharmaceutical: Lovenox 3. Pain Management:    Fentanyl 25 mcg patch    IV dilaudid transitioned to oxycodone prn.   4. Mood: Offer Ego support.  5. Neuropsych: This patient is capable of making decisions on her own behalf. 6. Skin/Wound Care: has evidence of malnutrition. Has been refusing nephro as well as protein supplements. Encourage intake to help promote wound healing as well as issues with ascites.    Ischial  Decub: Air mattress overlay for pressure relief measures. Added protein supplement for malnutrition. Dakin's solution with damp to dry dressing. May need plastics consult.            Right groin wound 7. Fluids/Electrolytes/Nutrition: Renal diet with 1200 cc FR. Monitor daily weights.  8. Left Ischium decub with osteo: Daily local care with saline dressing. Need to encourage intake of supplements to promote wound healing.     Per ID,  Vanc/Ceftazidime x6 weeks after discussion 9. T2DM: Monitor BS ac/hs.    Continue Lantus 20 units in the a.m.   Lantus 14 units at bedtime and increase to 19 units on 2/1     SSI for elevated BS.      CBG (last 3)  Recent Labs    06/28/17 1148 06/28/17 2059 06/29/17 0636  GLUCAP 80 79 96  controlled 2/3 10. HTN: Monitor BP bid. Continue isosorbide bid, labetalol tid, clonidine tid and norvasc daily.  Vitals:   06/28/17 2227 06/29/17 0035  BP: (!) 176/81 (!) 168/68  Pulse:  71  Resp:  18  Temp:  98.6 F (37 C)  SpO2:       Hydralazine 10 3 times a day started on 1/30, increased to 25 on 1/31   orthostatic vitals pending 11. Pruritis: Question due to rise in phosphorous levels. Sarna tid with benadryl prn.  12. Ascites: Monitor weight daily.  Filed Weights   06/27/17 1435 06/27/17 1900 06/29/17 0035  Weight: 80 kg (176 lb 5.9 oz) 78 kg (  171 lb 15.3 oz) 82 kg (180 lb 12.4 oz)    Incomplete tap on 1/29, removing 2 L, appreciate VIR 13. ESRD   Recs per Nephro- appreciate Dr Lorrene Reid note 14. Acute on chronic anemia  Hb 7.7 on 1/30   Cont to monitor   LOS (Days) 6 A FACE TO FACE EVALUATION WAS PERFORMED  Charlett Blake 06/29/2017 8:09 AM

## 2017-06-30 ENCOUNTER — Inpatient Hospital Stay (HOSPITAL_COMMUNITY): Payer: Medicare Other | Admitting: Occupational Therapy

## 2017-06-30 ENCOUNTER — Inpatient Hospital Stay (HOSPITAL_COMMUNITY): Payer: Medicare Other | Admitting: Speech Pathology

## 2017-06-30 ENCOUNTER — Inpatient Hospital Stay (HOSPITAL_COMMUNITY): Payer: Medicare Other | Admitting: Physical Therapy

## 2017-06-30 DIAGNOSIS — E162 Hypoglycemia, unspecified: Secondary | ICD-10-CM | POA: Insufficient documentation

## 2017-06-30 DIAGNOSIS — D72829 Elevated white blood cell count, unspecified: Secondary | ICD-10-CM

## 2017-06-30 LAB — CBC
HEMATOCRIT: 28.1 % — AB (ref 36.0–46.0)
HEMOGLOBIN: 8.4 g/dL — AB (ref 12.0–15.0)
MCH: 27 pg (ref 26.0–34.0)
MCHC: 29.9 g/dL — AB (ref 30.0–36.0)
MCV: 90.4 fL (ref 78.0–100.0)
Platelets: 315 10*3/uL (ref 150–400)
RBC: 3.11 MIL/uL — ABNORMAL LOW (ref 3.87–5.11)
RDW: 17.1 % — ABNORMAL HIGH (ref 11.5–15.5)
WBC: 12.9 10*3/uL — ABNORMAL HIGH (ref 4.0–10.5)

## 2017-06-30 LAB — RENAL FUNCTION PANEL
ANION GAP: 13 (ref 5–15)
Albumin: 1.7 g/dL — ABNORMAL LOW (ref 3.5–5.0)
BUN: 55 mg/dL — ABNORMAL HIGH (ref 6–20)
CO2: 24 mmol/L (ref 22–32)
Calcium: 7.6 mg/dL — ABNORMAL LOW (ref 8.9–10.3)
Chloride: 96 mmol/L — ABNORMAL LOW (ref 101–111)
Creatinine, Ser: 5.92 mg/dL — ABNORMAL HIGH (ref 0.44–1.00)
GFR calc non Af Amer: 8 mL/min — ABNORMAL LOW (ref >60)
GFR, EST AFRICAN AMERICAN: 9 mL/min — AB (ref >60)
GLUCOSE: 136 mg/dL — AB (ref 65–99)
PHOSPHORUS: 3.6 mg/dL (ref 2.5–4.6)
POTASSIUM: 5.2 mmol/L — AB (ref 3.5–5.1)
Sodium: 133 mmol/L — ABNORMAL LOW (ref 135–145)

## 2017-06-30 LAB — GLUCOSE, CAPILLARY
Glucose-Capillary: 68 mg/dL (ref 65–99)
Glucose-Capillary: 125 mg/dL — ABNORMAL HIGH (ref 65–99)
Glucose-Capillary: 168 mg/dL — ABNORMAL HIGH (ref 65–99)

## 2017-06-30 LAB — CULTURE, BODY FLUID-BOTTLE: CULTURE: NO GROWTH

## 2017-06-30 LAB — CULTURE, BODY FLUID W GRAM STAIN -BOTTLE

## 2017-06-30 MED ORDER — HYDRALAZINE HCL 50 MG PO TABS
50.0000 mg | ORAL_TABLET | Freq: Three times a day (TID) | ORAL | Status: DC
  Administered 2017-06-30 – 2017-07-02 (×5): 50 mg via ORAL
  Filled 2017-06-30 (×5): qty 1

## 2017-06-30 MED ORDER — OXYCODONE HCL 5 MG PO TABS
ORAL_TABLET | ORAL | Status: AC
  Filled 2017-06-30: qty 1

## 2017-06-30 MED ORDER — INSULIN GLARGINE 100 UNIT/ML ~~LOC~~ SOLN
20.0000 [IU] | Freq: Every day | SUBCUTANEOUS | Status: DC
  Administered 2017-06-30 – 2017-07-07 (×7): 20 [IU] via SUBCUTANEOUS
  Filled 2017-06-30 (×9): qty 0.2

## 2017-06-30 MED ORDER — VANCOMYCIN HCL IN DEXTROSE 750-5 MG/150ML-% IV SOLN
INTRAVENOUS | Status: AC
  Filled 2017-06-30: qty 150

## 2017-06-30 NOTE — Progress Notes (Signed)
[place] [facility] MEDICINE & REHABILITATION     PROGRESS NOTE  Subjective/Complaints:  Pt seen lying in bed this AM.  She states she slept well overnight.  She states she feels stronger.   ROS: Denies CP, SOB, nausea, vomiting, diarrhea.  Objective: Vital Signs: Blood pressure (!) 173/79, pulse 69, temperature 97.6 F (36.4 C), temperature source Oral, resp. rate 18, height 6\' 4"  (1.93 m), weight 82 kg (180 lb 12.4 oz), last menstrual period 10/09/2015, SpO2 93 %. No results found. Recent Labs    06/27/17 1449  WBC 13.3*  HGB 7.8*  HCT 25.7*  PLT 274   Recent Labs    06/27/17 1449  NA 133*  K 4.8  CL 94*  GLUCOSE 200*  BUN 50*  CREATININE 5.92*  CALCIUM 7.4*   CBG (last 3)  Recent Labs    06/29/17 1632 06/29/17 2112 06/30/17 0636  GLUCAP 186* 153* 68    Wt Readings from Last 3 Encounters:  06/29/17 82 kg (180 lb 12.4 oz)  06/23/17 76.8 kg (169 lb 6.4 oz)  06/11/17 83.5 kg (184 lb 1.3 oz)    Physical Exam:  BP (!) 173/79 (BP Location: Right Arm)   Pulse 69   Temp 97.6 F (36.4 C) (Oral)   Resp 18   Ht 6\' 4"  (1.93 m)   Wt 82 kg (180 lb 12.4 oz)   LMP 10/09/2015 Comment: pt on dialysis  SpO2 93%   BMI 22.00 kg/m  Constitutional: She has a sickly appearance. No distress.  HENT: Normocephalic and atraumatic.  Eyes:  and EOM are normal. No discharge.  Cardiovascular: RRR. No JVD. Respiratory: Effort normal and breath sounds normal.  GI: Bowel sounds are normal. She exhibits distension. Ascites.  Musculoskeletal: She exhibits edema.  Neurological: She is alert and oriented.  Motor: 3+-4-/5 B/l UE, LLE, RLE HF  Skin: Skin is warm and dry. She is not diaphoretic. Right groin wound C/D/I, not examined today Ischial Ulcer, not examined today BKA with dressing C/D/I  Psychiatric: Her speech is normal. Her mood appears anxious. She expresses impulsivity. She is inattentive.   Assessment/Plan: 1. Functional deficits secondary to Right BKA and wounds which  require 3+ hours per day of interdisciplinary therapy in a comprehensive inpatient rehab setting. Physiatrist is providing close team supervision and 24 hour management of active medical problems listed below. Physiatrist and rehab team continue to assess barriers to discharge/monitor patient progress toward functional and medical goals.  Function:  Bathing Bathing position   Position: Bed  Bathing parts Body parts bathed by patient: Right arm, Left arm, Chest, Abdomen Body parts bathed by helper: Front perineal area, Buttocks, Right upper leg, Left upper leg, Right lower leg, Left lower leg  Bathing assist Assist Level: (40%, maximal assist level)      Upper Body Dressing/Undressing Upper body dressing   What is the patient wearing?: Pull over shirt/dress     Pull over shirt/dress - Perfomed by patient: Thread/unthread right sleeve, Thread/unthread left sleeve, Put head through opening, Pull shirt over trunk(pt self reported) Pull over shirt/dress - Perfomed by helper: Pull shirt over trunk        Upper body assist Assist Level: Set up      Lower Body Dressing/Undressing Lower body dressing   What is the patient wearing?: Pants     Pants- Performed by patient: Thread/unthread right pants leg, Thread/unthread left pants leg, Pull pants up/down(Pt self reported) Pants- Performed by helper: Thread/unthread right pants leg, Thread/unthread left pants leg,  Pull pants up/down       Socks - Performed by helper: Don/doff right sock, Don/doff left sock   Shoes - Performed by helper: Don/doff right shoe, Don/doff left shoe, Fasten right, Fasten left          Lower body assist Assist for lower body dressing: Set up      Toileting Toileting     Toileting steps completed by helper: Adjust clothing prior to toileting, Performs perineal hygiene, Adjust clothing after toileting    Toileting assist     Transfers Chair/bed transfer   Chair/bed transfer method: Lateral  scoot Chair/bed transfer assist level: dependent (Pt equals 0%) Chair/bed transfer assistive device: Mechanical lift Mechanical lift: Maximove   Locomotion Ambulation Ambulation activity did not occur: Safety/medical Editor, commissioning activity did not occur: (pt in TIS at this time) Type: Manual      Cognition Comprehension Comprehension assist level: Follows complex conversation/direction with no assist  Expression Expression assist level: Expresses complex ideas: With no assist  Social Interaction Social Interaction assist level: Interacts appropriately with others with medication or extra time (anti-anxiety, antidepressant).  Problem Solving Problem solving assist level: Solves complex 90% of the time/cues < 10% of the time, Solves complex problems: With extra time  Memory Memory assist level: Recognizes or recalls 90% of the time/requires cueing < 10% of the time    Medical Problem List and Plan: 1.  Deficits with mobility, transfers, self-care secondary to Right BKA and wounds.   Continue CIR   2.  DVT Prophylaxis/Anticoagulation: Pharmaceutical: Lovenox 3. Pain Management:    Fentanyl 25 mcg patch    IV dilaudid transitioned to oxycodone prn.   4. Mood: Offer Ego support.  5. Neuropsych: This patient is capable of making decisions on her own behalf. 6. Skin/Wound Care: has evidence of malnutrition. Has been refusing nephro as well as protein supplements. Encourage intake to help promote wound healing as well as issues with ascites.    Ischial  Decub: Air mattress overlay for pressure relief measures. Added protein supplement for malnutrition. Dakin's solution with damp to dry dressing. May need plastics consult.            Right groin wound 7. Fluids/Electrolytes/Nutrition: Renal diet with 1200 cc FR. Monitor daily weights.  8. Left Ischium decub with osteo: Daily local care with saline dressing. Need to encourage intake of supplements to promote wound  healing.     Per ID, Vanc/Ceftazidime x6 weeks after discussion 9. T2DM: Monitor BS ac/hs.    Continue Lantus 20 units in the a.m.   Lantus 14 units at bedtime and increase to 19 units on 2/1, increased further over the weekend, decreased to 20 units on 2/4   SSI for elevated BS.    CBG (last 3)  Recent Labs    06/29/17 1632 06/29/17 2112 06/30/17 0636  GLUCAP 186* 153* 68    Labile with hypoglycemia this AM, cont to monitor 10. HTN: Monitor BP bid. Continue isosorbide bid, labetalol tid, clonidine tid and norvasc daily.  Vitals:   06/29/17 2051 06/30/17 0233  BP: (!) 178/77 (!) 173/79  Pulse:  69  Resp:  18  Temp:  97.6 F (36.4 C)  SpO2:  93%     Hydralazine 10 3 times a day started on 1/30, increased to 25 on 1/31, increased to 50 on 2/4   orthostatic vitals remain pending 11. Pruritis: Question due to rise in phosphorous levels. Sarna tid  with benadryl prn.  12. Ascites: Monitor weight daily.  Filed Weights   06/27/17 1435 06/27/17 1900 06/29/17 0035  Weight: 80 kg (176 lb 5.9 oz) 78 kg (171 lb 15.3 oz) 82 kg (180 lb 12.4 oz)    Incomplete tap on 1/29, removing 2 L, appreciate VIR 13. ESRD   Recs per Nephro- appreciate Nephro note 14. Acute on chronic anemia  Hb 7.8 on 2/1   Cont to monitor 15. Leukocytosis   WBCs 13.3 on 2/1   Afebrile  Cont to monitor   LOS (Days) 7 A FACE TO FACE EVALUATION WAS PERFORMED  Ankit Lorie Phenix 06/30/2017 10:26 AM

## 2017-06-30 NOTE — Progress Notes (Signed)
Speech Language Pathology Daily Session Note  Patient Details  Name: [name] MRN: [id number] Date of Birth: June 30, 1975  Today's Date: 06/30/2017 SLP Individual Time: 0930-1030 SLP Individual Time Calculation (min): 60 min  Short Term Goals: Week 1: SLP Short Term Goal 1 (Week 1): Pt will utilize external aids to facilitate recall of daily information with mod I.  SLP Short Term Goal 2 (Week 1): Pt will complete semi-complex tasks with mod I for functional problem solving.    Skilled Therapeutic Interventions: Skilled treatment session focused on cognition goals. SLP facilitated session by providing semi-complex to complex auditory problem solving situations. Pt with accurate answers despite severe pain (d/t sacral wound). Pt able to utilize external aids to recall sessions and upcoming sessions. Pt left side-laying in bed with all needs within reach. Continue per current plan of care.      Function:  Eating Eating                 Cognition Comprehension Comprehension assist level: Follows complex conversation/direction with no assist  Expression   Expression assist level: Expresses complex ideas: With no assist  Social Interaction Social Interaction assist level: Interacts appropriately with others with medication or extra time (anti-anxiety, antidepressant).  Problem Solving Problem solving assist level: Solves complex 90% of the time/cues < 10% of the time;Solves complex problems: With extra time  Memory Memory assist level: Recognizes or recalls 90% of the time/requires cueing < 10% of the time    Pain Pain Assessment Pain Score: 4   Therapy/Group: Individual Therapy  [name] 06/30/2017, 10:20 AM

## 2017-06-30 NOTE — Progress Notes (Signed)
Occupational Therapy Weekly Progress Note  Patient Details  Name: [name] MRN: [id number] Date of Birth: [birth year]  Beginning of progress report period: June 24, 2017 End of progress report period: June 30, 2017  Today's Date: 06/30/2017 OT Individual Time: 1700-1749 OT Individual Time Calculation (min): 38 min    Patient has met 3 of 5 short term goals.  Pt has met 3 goals and is progressing with 2 but has not quite reached a standing tolerance of 2-3 min in the [place] lift. She currently can tolerate 1 min.  She has progressed to sitting on a padded open seat tub bench to use as a BSC but can not tolerate a full 5-10 min.  Patient continues to demonstrate the following deficits: muscle weakness and muscle joint tightness, decreased cardiorespiratoy endurance, decreased memory and decreased standing balance and decreased balance strategies and therefore will continue to benefit from skilled OT intervention to enhance overall performance with BADL.  Patient progressing toward long term goals..  Continue plan of care.  OT Short Term Goals Week 1:  OT Short Term Goal 1 (Week 1): Pt will tolerate sitting EOB for 10 min to engage in sitting bathing and dressing. OT Short Term Goal 1 - Progress (Week 1): Met OT Short Term Goal 2 (Week 1): Pt will be able to loop pants over feet with min A. OT Short Term Goal 2 - Progress (Week 1): Met OT Short Term Goal 3 (Week 1): Pt will be able to pull pants over hips with lateral leans with min A. OT Short Term Goal 3 - Progress (Week 1): Met OT Short Term Goal 4 (Week 1): Pt will tolerate standing in [place] lift for 2-3 minutes at a time to develop standing tolerance.  OT Short Term Goal 4 - Progress (Week 1): Progressing toward goal OT Short Term Goal 5 (Week 1): Pt will be able to tolerate sitting on a padded BSC for 5-10 min.  OT Short Term Goal 5 - Progress (Week 1): Progressing toward goal Week 2:  OT Short Term Goal 1 (Week 2): Pt will  tolerate sitting on a padded BSC for 5- 10 min. OT Short Term Goal 2 (Week 2): Pt will be able to use lateral leans to pull pants up/down hips from sitting on a BSC. OT Short Term Goal 3 (Week 2): Pt will be able to tolerate standing in the [place] lift for 2 min to develop standing tolerance.   Skilled Therapeutic Interventions/Progress Updates:    Pt received in bed and agreeable to therapy.  Reviewed the 5 STGs with pt and pt agreeable to working all those movement components.  Pt reported that she bathed last night and that she was able to dress herself from supine this am. Her clothing was laid out for her. She said she use rolling to pull pants over hips.   Pt sat to EOB with mod A to fully bring both legs into chest. Once at EOB pt sat for 11 min while working on UE AROM exercises for reaching, rotation of trunk, and shoulder strengthening.  Pt could tolerated 10-20 reps of each exercise before needing a brief rest.  She also worked on lateral leans in bed and practiced pulling pants up/down hips with min A. Used [place] lift to bring pt into standing. Pt only tolerated 1 min, needed 2 min rest and then stood again for 1 min.  She stated her stand tolerance was low not because of L leg weakness but  discomfort with the stretching of her skin over her sacral wounds.  Pt was very tired at this point and needed to lay back down. Pt resting in bed with all needs met.    Therapy Documentation Precautions:  Precautions Precautions: Fall Precaution Comments: wound vac from groin, sacral decubital wound  Restrictions Weight Bearing Restrictions: Yes RLE Weight Bearing: Non weight bearing   Pain: Pain Assessment Pain Assessment: 0-10 Pain Score: 4  Pain Location: Leg Pain Orientation: Right Pain Descriptors / Indicators: Aching Pain Frequency: Constant Patients Stated Pain Goal: 4 Pain Intervention(s): Medication (See eMAR) ADL: ADL ADL Comments: see functional naviagtor  See Function  Navigator for Current Functional Status.   Therapy/Group: Individual Therapy  [place] 06/30/2017, 9:11 AM

## 2017-06-30 NOTE — Progress Notes (Signed)
[facility] Progress Note  Subjective: no new c/o  Vitals:   06/29/17 1418 06/29/17 1523 06/29/17 2051 06/30/17 0233  BP: 136/70  (!) 178/77 (!) 173/79  Pulse: 66 69  69  Resp: 18   18  Temp: (!) 97.3 F (36.3 C)   97.6 F (36.4 C)  TempSrc: Oral   Oral  SpO2: 100%   93%  Weight:      Height:        Inpatient medications: . amLODipine  10 mg Oral QHS  . atorvastatin  40 mg Oral q1800  . cinacalcet  60 mg Oral Q supper  . cloNIDine  0.3 mg Oral TID  . darbepoetin (ARANESP) injection - DIALYSIS  200 mcg Intravenous Q Fri-HD  . docusate  100 mg Oral TID  . fentaNYL  25 mcg Transdermal Q72H  . ferric citrate  420 mg Oral TID WC  . gabapentin  100 mg Oral QHS  . heparin  5,000 Units Subcutaneous Q8H  . hydrALAZINE  50 mg Oral Q8H  . insulin aspart  0-9 Units Subcutaneous TID WC  . insulin glargine  20 Units Subcutaneous Daily  . insulin glargine  20 Units Subcutaneous QHS  . isosorbide dinitrate  60 mg Oral BID  . labetalol  300 mg Oral TID  . metoCLOPramide  2.5 mg Oral TID AC  . multivitamin  1 tablet Oral QHS  . pantoprazole  40 mg Oral Daily  . protein supplement  1 scoop Oral TID WC  . sertraline  50 mg Oral Daily  . sevelamer carbonate  2,400 mg Oral TID WC   . cefTAZidime (FORTAZ)  IV Stopped (06/29/17 1555)  . vancomycin Stopped (06/27/17 2044)   acetaminophen, aluminum hydroxide, bisacodyl, diphenhydrAMINE, guaiFENesin-dextromethorphan, LORazepam, oxyCODONE, phenol, polyethylene glycol, prochlorperazine **OR** prochlorperazine **OR** prochlorperazine, traMADol, traZODone  Exam: Frail but awake and alert Regular S1S2 No S3 Lungs clear Abd tight but not taut (ascites) + BS. Not tender R groin wound not examined R BKA stump with 1+ edema shrinker on and in immobilizer No LLE edema Sacral wounds covered not examined Muscle wasting throughout Dialysis Access: left AVF + bruit   Dialysis: MWF [facility] 4h 66min   400/800  85.5kg   2/2.25 bath  Hep  2000 (no heparin for now w/ small IC bleed)   -mircera 200 every 2 wks      Impression: 1  Decub 2  BKA 3  R groin wound 4  ESRD HD mwf 5  Fall / left parietal bleed, small 6  Ascites 7  Anemia ckd, darbe 200/ wk 8  HTN chron/ severe, cont mult BP meds, avoid SBP < 120- 140 9  Malnutrtion 10  CAD 11  DM2   Plan - HD today   Kelly Splinter MD [facility] pager [phone]   06/30/2017, 12:42 PM   Recent Labs  Lab 06/25/17 1420 06/27/17 1449  NA 134* 133*  K 4.4 4.8  CL 94* 94*  CO2 26 24  GLUCOSE 263* 200*  BUN 46* 50*  CREATININE 5.66* 5.92*  CALCIUM 7.8* 7.4*  PHOS 4.6 4.0   Recent Labs  Lab 06/25/17 1420 06/27/17 1449  ALBUMIN 1.6* 1.7*   Recent Labs  Lab 06/25/17 1420 06/27/17 1449  WBC 11.9* 13.3*  HGB 7.7* 7.8*  HCT 24.9* 25.7*  MCV 91.2 89.5  PLT 265 274   Iron/TIBC/Ferritin/ %Sat    Component Value Date/Time   IRON 26 (L) 06/23/2017 0708   TIBC 113 (L)  06/23/2017 0708   FERRITIN 1,050 (H) 06/23/2017 0708   IRONPCTSAT 23 06/23/2017 0708

## 2017-06-30 NOTE — Progress Notes (Signed)
Physical Therapy Session Note  Patient Details  Name: [name] MRN: [id number] Date of Birth: 12-05-1975  Today's Date: 06/30/2017 PT Individual Time: 7096-2836 PT Individual Time Calculation (min): 45 min   Short Term Goals: Week 1:  PT Short Term Goal 1 (Week 1): pt will tolerate sitting in w/c x 1 hour, 3 x a day PT Short Term Goal 2 (Week 1): pt will perform functional transfers without use of lift equipment with +2 assist  Skilled Therapeutic Interventions/Progress Updates:    Pt in bed upon arrival, agreeable to PT session. Bed mobility: supine>sitting with mod assist. Transfers: sliding board bed>w/c with mod assist and cues. In gym, attempted sit>stand at parallel bars. Pt unable to achieve standing despite multiple attempts. Modified session to working on forward lean and anterior weight shifts onto LLE. Also working on incorporating UEs and attempting different placement options. Also working on LLE resisted leg press and Rt LE extension stretch. Education performed on need for Rt knee extension. Following session pt returned to room with all needs in reach.   Therapy Documentation Precautions:  Precautions Precautions: Fall Precaution Comments: wound vac from groin, sacral decubital wound  Restrictions Weight Bearing Restrictions: Yes RLE Weight Bearing: Non weight bearing Pain:  Reports Rt LE hurts when moving and sore on buttock. Reports that she has had pain meds, monitored throughout session.   See Function Navigator for Current Functional Status.   Therapy/Group: Individual Therapy  Linard Millers , PT 06/30/2017, 12:56 PM

## 2017-07-01 ENCOUNTER — Encounter (HOSPITAL_COMMUNITY): Payer: Self-pay | Admitting: General Surgery

## 2017-07-01 ENCOUNTER — Inpatient Hospital Stay (HOSPITAL_COMMUNITY): Payer: Medicare Other

## 2017-07-01 ENCOUNTER — Inpatient Hospital Stay (HOSPITAL_COMMUNITY): Payer: Medicare Other | Admitting: Occupational Therapy

## 2017-07-01 ENCOUNTER — Encounter (HOSPITAL_COMMUNITY): Payer: Medicare Other | Admitting: Psychology

## 2017-07-01 DIAGNOSIS — I169 Hypertensive crisis, unspecified: Secondary | ICD-10-CM

## 2017-07-01 DIAGNOSIS — R7309 Other abnormal glucose: Secondary | ICD-10-CM

## 2017-07-01 HISTORY — PX: IR PARACENTESIS: IMG2679

## 2017-07-01 LAB — GLUCOSE, CAPILLARY
GLUCOSE-CAPILLARY: 121 mg/dL — AB (ref 65–99)
GLUCOSE-CAPILLARY: 170 mg/dL — AB (ref 65–99)
Glucose-Capillary: 210 mg/dL — ABNORMAL HIGH (ref 65–99)
Glucose-Capillary: 159 mg/dL — ABNORMAL HIGH (ref 65–99)
Glucose-Capillary: 128 mg/dL — ABNORMAL HIGH (ref 65–99)

## 2017-07-01 LAB — BODY FLUID CELL COUNT WITH DIFFERENTIAL
Eos, Fluid: 0 %
LYMPHS FL: 11 %
Monocyte-Macrophage-Serous Fluid: 79 % (ref 50–90)
Neutrophil Count, Fluid: 10 % (ref 0–25)
Total Nucleated Cell Count, Fluid: 320 cu mm (ref 0–1000)

## 2017-07-01 LAB — ALBUMIN, PLEURAL OR PERITONEAL FLUID: Albumin, Fluid: 1.4 g/dL

## 2017-07-01 MED ORDER — LIDOCAINE 2% (20 MG/ML) 5 ML SYRINGE
INTRAMUSCULAR | Status: AC
  Filled 2017-07-01: qty 10

## 2017-07-01 NOTE — Progress Notes (Signed)
Physical Therapy Session Note  Patient Details  Name: [name] MRN: [id number] Date of Birth: 10/17/1975  Today's Date: 07/01/2017 PT Individual Time: 0930-1028 PT Individual Time Calculation (min): 58 min   Short Term Goals: Week 1:  PT Short Term Goal 1 (Week 1): pt will tolerate sitting in w/c x 1 hour, 3 x a day PT Short Term Goal 2 (Week 1): pt will perform functional transfers without use of lift equipment with +2 assist  Skilled Therapeutic Interventions/Progress Updates:    Session focused on bed mobility re-training on flat surface with focus on technique and trunk activation (mod assist needed for sidelying -> sit), slideboard transfers with up to mod assist to increase anterior weightshift for bottom clearance as able to decrease risk of sheering) with cues for technique, sit <> stand attempts from elevated mat table with Stedy x 8 reps (unable to get into standing despite max assist) but able to improve clearance, and therex for core strengthening and residual limb strengthening. Instructed with 2.2# weighted medicine ball for trunk rotation x 10 reps each direction and modified anterior lean with weighted medicine ball to midline x 10 reps. Due to abdominal discomfort unable to tolerate propped position on wedge and therapy ball for core activation exercise. In sideyling, instructed in hip abduction and hip extension x 10 reps x 2 sets each for RLE strengthening. Passive stretching to RLE for promotion of extension and donned KI at end of session.   Therapy Documentation Precautions:  Precautions Precautions: Fall Precaution Comments: wound vac from groin, sacral decubital wound  Restrictions Weight Bearing Restrictions: Yes RLE Weight Bearing: Non weight bearing   Pain: Significant pain 8/10 in RLE throughout session - only relief in sidelying position. Repositioned as able.   See Function Navigator for Current Functional Status.   Therapy/Group: Individual  Therapy  Canary Brim Ivory Broad, PT, DPT  07/01/2017, 10:44 AM

## 2017-07-01 NOTE — Progress Notes (Signed)
[place] PHYSICAL MEDICINE & REHABILITATION     PROGRESS NOTE  Subjective/Complaints:  Pt seen lying in bed this AM.  She states she did not sleep well overnight, but does not know why.    ROS: Denies CP, SOB, nausea, vomiting, diarrhea.  Objective: Vital Signs: Blood pressure (!) 146/59, pulse 73, temperature 98.4 F (36.9 C), temperature source Axillary, resp. rate 20, height 6\' 4"  (1.93 m), weight 89 kg (196 lb 3.4 oz), last menstrual period 10/09/2015, SpO2 93 %. No results found. Recent Labs    06/30/17 1516  WBC 12.9*  HGB 8.4*  HCT 28.1*  PLT 315   Recent Labs    06/30/17 1517  NA 133*  K 5.2*  CL 96*  GLUCOSE 136*  BUN 55*  CREATININE 5.92*  CALCIUM 7.6*   CBG (last 3)  Recent Labs    06/30/17 1133 06/30/17 2039 07/01/17 0656  GLUCAP 125* 168* 210*    Wt Readings from Last 3 Encounters:  06/30/17 89 kg (196 lb 3.4 oz)  06/23/17 76.8 kg (169 lb 6.4 oz)  06/11/17 83.5 kg (184 lb 1.3 oz)    Physical Exam:  BP (!) 146/59 (BP Location: Right Arm)   Pulse 73   Temp 98.4 F (36.9 C) (Axillary)   Resp 20   Ht 6\' 4"  (1.93 m)   Wt 89 kg (196 lb 3.4 oz)   LMP 10/09/2015 Comment: pt on dialysis  SpO2 93%   BMI 23.88 kg/m  Constitutional: She has a sickly appearance. No distress.  HENT: Normocephalic and atraumatic.  Eyes:  and EOM are normal. No discharge.  Cardiovascular: RRR. No JVD. Respiratory: Effort normal and breath sounds normal.  GI: Bowel sounds are normal. She exhibits distension. Ascites, increasing.  Musculoskeletal: She exhibits edema.  Neurological: She is alert and oriented.  Motor: 3+-4-/5 B/l UE, LLE, RLE HF  Skin: Skin is warm and dry. She is not diaphoretic. Right groin wound C/D/I, not examined today Ischial Ulcer, not examined today BKA with dressing C/D/I  Psychiatric: Her speech is normal. Blunt.  Assessment/Plan: 1. Functional deficits secondary to Right BKA and wounds which require 3+ hours per day of interdisciplinary  therapy in a comprehensive inpatient rehab setting. Physiatrist is providing close team supervision and 24 hour management of active medical problems listed below. Physiatrist and rehab team continue to assess barriers to discharge/monitor patient progress toward functional and medical goals.  Function:  Bathing Bathing position   Position: Bed  Bathing parts Body parts bathed by patient: Right arm, Left arm, Chest, Abdomen Body parts bathed by helper: Front perineal area, Buttocks, Right upper leg, Left upper leg, Right lower leg, Left lower leg  Bathing assist Assist Level: (40%, maximal assist level)      Upper Body Dressing/Undressing Upper body dressing   What is the patient wearing?: Pull over shirt/dress     Pull over shirt/dress - Perfomed by patient: Thread/unthread right sleeve, Thread/unthread left sleeve, Put head through opening, Pull shirt over trunk(pt self reported) Pull over shirt/dress - Perfomed by helper: Pull shirt over trunk        Upper body assist Assist Level: Set up      Lower Body Dressing/Undressing Lower body dressing   What is the patient wearing?: Pants     Pants- Performed by patient: Thread/unthread right pants leg, Thread/unthread left pants leg, Pull pants up/down(Pt self reported) Pants- Performed by helper: Thread/unthread right pants leg, Thread/unthread left pants leg, Pull pants up/down  Socks - Performed by helper: Don/doff right sock, Don/doff left sock   Shoes - Performed by helper: Don/doff right shoe, Don/doff left shoe, Fasten right, Fasten left          Lower body assist Assist for lower body dressing: Set up      Toileting Toileting     Toileting steps completed by helper: Adjust clothing prior to toileting, Performs perineal hygiene, Adjust clothing after toileting    Toileting assist     Transfers Chair/bed transfer   Chair/bed transfer method: Lateral scoot Chair/bed transfer assist level: dependent (Pt  equals 0%) Chair/bed transfer assistive device: Mechanical lift Mechanical lift: Maximove   Locomotion Ambulation Ambulation activity did not occur: Safety/medical Editor, commissioning activity did not occur: (pt in TIS at this time) Type: Manual      Cognition Comprehension Comprehension assist level: Follows complex conversation/direction with no assist  Expression Expression assist level: Expresses complex ideas: With no assist  Social Interaction Social Interaction assist level: Interacts appropriately with others with medication or extra time (anti-anxiety, antidepressant).  Problem Solving Problem solving assist level: Solves complex 90% of the time/cues < 10% of the time, Solves complex problems: With extra time  Memory Memory assist level: Recognizes or recalls 90% of the time/requires cueing < 10% of the time    Medical Problem List and Plan: 1.  Deficits with mobility, transfers, self-care secondary to Right BKA and wounds.   Continue CIR   2.  DVT Prophylaxis/Anticoagulation: Pharmaceutical: Lovenox 3. Pain Management:    Fentanyl 25 mcg patch    IV dilaudid transitioned to oxycodone prn.   4. Mood: Offer Ego support.  5. Neuropsych: This patient is capable of making decisions on her own behalf. 6. Skin/Wound Care: has evidence of malnutrition. Has been refusing nephro as well as protein supplements. Encourage intake to help promote wound healing as well as issues with ascites.    Ischial  Decub: Air mattress overlay for pressure relief measures. Added protein supplement for malnutrition. Dakin's solution with damp to dry dressing. May need plastics consult.            Right groin wound 7. Fluids/Electrolytes/Nutrition: Renal diet with 1200 cc FR. Monitor daily weights.  8. Left Ischium decub with osteo: Daily local care with saline dressing. Need to encourage intake of supplements to promote wound healing.     Per ID, Vanc/Ceftazidime x6 weeks after  discussion 9. T2DM: Monitor BS ac/hs.    Continue Lantus 20 units in the a.m.   Lantus 14 units at bedtime and increase to 19 units on 2/1, increased further over the weekend, decreased to 20 units on 2/4, increased to 21 units on 2/5   SSI for elevated BS.    CBG (last 3)  Recent Labs    06/30/17 1133 06/30/17 2039 07/01/17 0656  GLUCAP 125* 168* 210*    Trending up on 2/5 10. HTN: Monitor BP bid. Continue isosorbide bid, labetalol tid, clonidine tid and norvasc daily.  Vitals:   06/30/17 2037 07/01/17 0042  BP: (!) 184/88 (!) 146/59  Pulse: 76 73  Resp: 20 20  Temp: 98.2 F (36.8 C) 98.4 F (36.9 C)  SpO2: 97% 93%     Hydralazine 10 3 times a day started on 1/30, increased to 25 on 1/31, increased to 50 on 2/4   Remains labile with hypertensive crisis overnight, plan for paracentesis 11. Pruritis: Question due to rise in phosphorous levels. Sarna  tid with benadryl prn.  12. Ascites: Monitor weight daily.  Filed Weights   06/29/17 0035 06/30/17 1320 06/30/17 1742  Weight: 82 kg (180 lb 12.4 oz) 92 kg (202 lb 13.2 oz) 89 kg (196 lb 3.4 oz)    Incomplete tap on 1/29, removing 2 L, appreciate VIR   Will ask to tap again 13. ESRD   Recs per Nephro- appreciate Nephro note 14. Acute on chronic anemia  Hb 8.4 on 2/4   Cont to monitor 15. Leukocytosis   WBCs 12.9 on 2/4   Afebrile  Cont to monitor   LOS (Days) 8 A FACE TO FACE EVALUATION WAS PERFORMED  Ankit Lorie Phenix 07/01/2017 9:16 AM

## 2017-07-01 NOTE — Progress Notes (Signed)
Occupational Therapy Session Note  Patient Details  Name: [name] MRN: [id number] Date of Birth: 1976-04-23  Today's Date: 07/01/2017 OT Individual Time: 0805-0900 OT Individual Time Calculation (min): 55 min    Short Term Goals: Week 2:  OT Short Term Goal 1 (Week 2): Pt will tolerate sitting on a padded BSC for 5- 10 min. OT Short Term Goal 2 (Week 2): Pt will be able to use lateral leans to pull pants up/down hips from sitting on a BSC. OT Short Term Goal 3 (Week 2): Pt will be able to tolerate standing in the [place] lift for 2 min to develop standing tolerance.   Skilled Therapeutic Interventions/Progress Updates:    Pt presented supine in bed, agreeable to OT tx session. Pt with increased pain in RLE and L shoulder this session (RN notified of increasing pain in R shoulder, gentle stretching completed during session). Pt completed bed mobility with ModA for trunk support to sit EOB. Pt stood x2 during session using Clarise Cruz with Pt able to tolerate standing approx 30 sec and 45 sec each trial. Pt positioned on roho cushion seated EOB with Pt able to tolerate sitting upright for approx 45 min during session completion. Seated EOB Pt engaged in bil UE exercises for 10x each set includingshoulder forward flexion, abd/adduction, horizontal abd/adduction, LLE extension. Pt completing lateral leans onto L/R elbow in preparation for seated ADL completion, requiring min-modA to return to upright sitting position and with use of bed rails. Pt completing modified chair pushups using yoga blocks to increase UB strength for 2 sets x10-15 reps. Additional focus on RLE stretching to promote/increase LE extension. Pt requesting return to bed end of session, completing with S. Pt left sidelying in bed with call bell and needs within reach.   Therapy Documentation Precautions:  Precautions Precautions: Fall Precaution Comments: wound vac from groin, sacral decubital wound  Restrictions Weight Bearing  Restrictions: Yes RLE Weight Bearing: Non weight bearing  ADL: ADL ADL Comments: see functional naviagtor  See Function Navigator for Current Functional Status.   Therapy/Group: Individual Therapy  Raymondo Band 07/01/2017, 9:20 AM

## 2017-07-01 NOTE — Progress Notes (Signed)
Pt did not tolerate being on back for paracentesis; returning to unit. IR asking for pt to have anesthesia for procedure. Algis Liming [facility] notified of request. Forwarding info to Dr. Posey Pronto. .me

## 2017-07-01 NOTE — Progress Notes (Signed)
Occupational Therapy Note  Patient Details  Name: [name] MRN: [id number] Date of Birth: Jun 10, 1975  Today's Date: 07/01/2017 OT Missed Time: 28 Minutes Missed Time Reason: Out of hospital appointment  Pt off unit for procedure; missing 30 min of OT.   Willeen Cass [facility] 07/01/2017, 3:26 PM

## 2017-07-01 NOTE — Progress Notes (Signed)
[facility] Progress Note  Subjective: no new c/o  Vitals:   06/30/17 1730 06/30/17 1742 06/30/17 2037 07/01/17 0042  BP: (!) 171/94 (!) 182/88 (!) 184/88 (!) 146/59  Pulse: 68 68 76 73  Resp: 20 20 20 20   Temp:  97.6 F (36.4 C) 98.2 F (36.8 C) 98.4 F (36.9 C)  TempSrc:  Oral Axillary Axillary  SpO2:   97% 93%  Weight:  89 kg (196 lb 3.4 oz)    Height:        Inpatient medications: . amLODipine  10 mg Oral QHS  . atorvastatin  40 mg Oral q1800  . cinacalcet  60 mg Oral Q supper  . cloNIDine  0.3 mg Oral TID  . darbepoetin (ARANESP) injection - DIALYSIS  200 mcg Intravenous Q Fri-HD  . docusate  100 mg Oral TID  . fentaNYL  25 mcg Transdermal Q72H  . ferric citrate  420 mg Oral TID WC  . gabapentin  100 mg Oral QHS  . heparin  5,000 Units Subcutaneous Q8H  . hydrALAZINE  50 mg Oral Q8H  . insulin aspart  0-9 Units Subcutaneous TID WC  . insulin glargine  20 Units Subcutaneous Daily  . insulin glargine  20 Units Subcutaneous QHS  . isosorbide dinitrate  60 mg Oral BID  . labetalol  300 mg Oral TID  . metoCLOPramide  2.5 mg Oral TID AC  . multivitamin  1 tablet Oral QHS  . pantoprazole  40 mg Oral Daily  . protein supplement  1 scoop Oral TID WC  . sertraline  50 mg Oral Daily  . sevelamer carbonate  2,400 mg Oral TID WC   . cefTAZidime (FORTAZ)  IV Stopped (06/30/17 2331)  . vancomycin 750 mg (06/30/17 1630)   acetaminophen, aluminum hydroxide, bisacodyl, diphenhydrAMINE, guaiFENesin-dextromethorphan, LORazepam, oxyCODONE, phenol, polyethylene glycol, prochlorperazine **OR** prochlorperazine **OR** prochlorperazine, traMADol, traZODone  Exam: Frail but awake and alert Regular S1S2 No S3 Lungs clear Abd tight but not taut (ascites) + BS. Not tender R groin wound not examined R BKA stump with 1+ edema shrinker on and in immobilizer No LLE edema Sacral wounds covered not examined Muscle wasting throughout Dialysis Access: left AVF +  bruit   Dialysis: MWF [facility] 4h 62min   400/800  85.5kg   2/2.25 bath  Hep 2000 (no heparin for now w/ small IC bleed)   -mircera 200 every 2 wks      Impression: 1  Decub 2  BKA 3  R groin wound 4  ESRD HD mwf 5  Fall / left parietal bleed, small 6  Ascites 7  Anemia ckd, darbe 200/ wk 8  HTN chron/ severe, cont mult BP meds, avoid SBP < 120- 140 9  Malnutrtion 10  CAD 11  DM2   Plan - HD today   Kelly Splinter MD [facility] pager [phone]   07/01/2017, 1:30 PM   Recent Labs  Lab 06/25/17 1420 06/27/17 1449 06/30/17 1517  NA 134* 133* 133*  K 4.4 4.8 5.2*  CL 94* 94* 96*  CO2 26 24 24   GLUCOSE 263* 200* 136*  BUN 46* 50* 55*  CREATININE 5.66* 5.92* 5.92*  CALCIUM 7.8* 7.4* 7.6*  PHOS 4.6 4.0 3.6   Recent Labs  Lab 06/25/17 1420 06/27/17 1449 06/30/17 1517  ALBUMIN 1.6* 1.7* 1.7*   Recent Labs  Lab 06/25/17 1420 06/27/17 1449 06/30/17 1516  WBC 11.9* 13.3* 12.9*  HGB 7.7* 7.8* 8.4*  HCT 24.9* 25.7* 28.1*  MCV 91.2 89.5 90.4  PLT 265 274 315   Iron/TIBC/Ferritin/ %Sat    Component Value Date/Time   IRON 26 (L) 06/23/2017 0708   TIBC 113 (L) 06/23/2017 0708   FERRITIN 1,050 (H) 06/23/2017 0708   IRONPCTSAT 23 06/23/2017 0708

## 2017-07-01 NOTE — Progress Notes (Signed)
[facility]  Subjective  - Doing a little better with mobility, sat in chair for an hour at a time.  Complaints of stump pain and is asking if her gabapentin can be increased.     Objective (!) 146/59 73 98.4 F (36.9 C) (Axillary) 20 93%  Intake/Output Summary (Last 24 hours) at 07/01/2017 1122 Last data filed at 07/01/2017 0758 Gross per 24 hour  Intake 760 ml  Output 3345 ml  Net -2585 ml    Right BKA stump with coolness to touch, decreasing ecchymosis with scab formation over incision. Popliteal doppler signal monophasic on right LE.    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   We will continue to monitor the right BKA stump and groin incision healing.   I suggested tactile desensitization to the stump and am agreeable to increasing her Neurontin.  I spoke with Reesa Chew PA-C in [place] and she will increase her Neurontin.  We will see her again in a few days and also look at the right groin wound at that time.     Roxy Horseman 07/01/2017 11:22 AM --  Laboratory Lab Results: Recent Labs    06/30/17 1516  WBC 12.9*  HGB 8.4*  HCT 28.1*  PLT 315   BMET Recent Labs    06/30/17 1517  NA 133*  K 5.2*  CL 96*  CO2 24  GLUCOSE 136*  BUN 55*  CREATININE 5.92*  CALCIUM 7.6*    COAG Lab Results  Component Value Date   INR 1.14 06/17/2017   INR 1.20 06/11/2017   INR 1.19 05/09/2017   No results found for: PTT

## 2017-07-01 NOTE — Procedures (Signed)
Ultrasound-guided diagnostic and therapeutic paracentesis performed yielding 7.9 liters of serous colored fluid. No immediate complications.  [name] 3:23 PM 07/01/2017

## 2017-07-02 ENCOUNTER — Inpatient Hospital Stay (HOSPITAL_COMMUNITY): Payer: Medicare Other | Admitting: Occupational Therapy

## 2017-07-02 ENCOUNTER — Inpatient Hospital Stay (HOSPITAL_COMMUNITY): Payer: Medicare Other | Admitting: Physical Therapy

## 2017-07-02 ENCOUNTER — Inpatient Hospital Stay (HOSPITAL_COMMUNITY): Payer: Medicare Other | Admitting: Speech Pathology

## 2017-07-02 ENCOUNTER — Inpatient Hospital Stay (HOSPITAL_COMMUNITY): Payer: Medicare Other

## 2017-07-02 DIAGNOSIS — K7011 Alcoholic hepatitis with ascites: Secondary | ICD-10-CM

## 2017-07-02 LAB — RENAL FUNCTION PANEL
Albumin: 1.6 g/dL — ABNORMAL LOW (ref 3.5–5.0)
Anion gap: 16 — ABNORMAL HIGH (ref 5–15)
BUN: 51 mg/dL — AB (ref 6–20)
CHLORIDE: 95 mmol/L — AB (ref 101–111)
CO2: 24 mmol/L (ref 22–32)
Calcium: 7.7 mg/dL — ABNORMAL LOW (ref 8.9–10.3)
Creatinine, Ser: 5.48 mg/dL — ABNORMAL HIGH (ref 0.44–1.00)
GFR calc Af Amer: 10 mL/min — ABNORMAL LOW (ref >60)
GFR calc non Af Amer: 9 mL/min — ABNORMAL LOW (ref >60)
GLUCOSE: 228 mg/dL — AB (ref 65–99)
Phosphorus: 3.6 mg/dL (ref 2.5–4.6)
Potassium: 4.7 mmol/L (ref 3.5–5.1)
SODIUM: 135 mmol/L (ref 135–145)

## 2017-07-02 LAB — CBC
HCT: 27.3 % — ABNORMAL LOW (ref 36.0–46.0)
Hemoglobin: 8.1 g/dL — ABNORMAL LOW (ref 12.0–15.0)
MCH: 27.2 pg (ref 26.0–34.0)
MCHC: 29.7 g/dL — AB (ref 30.0–36.0)
MCV: 91.6 fL (ref 78.0–100.0)
PLATELETS: 300 10*3/uL (ref 150–400)
RBC: 2.98 MIL/uL — ABNORMAL LOW (ref 3.87–5.11)
RDW: 17.4 % — AB (ref 11.5–15.5)
WBC: 12.3 10*3/uL — ABNORMAL HIGH (ref 4.0–10.5)

## 2017-07-02 LAB — VANCOMYCIN, RANDOM: VANCOMYCIN RM: 17

## 2017-07-02 LAB — GRAM STAIN: GRAM STAIN: NONE SEEN

## 2017-07-02 LAB — GLUCOSE, CAPILLARY
GLUCOSE-CAPILLARY: 161 mg/dL — AB (ref 65–99)
GLUCOSE-CAPILLARY: 176 mg/dL — AB (ref 65–99)
Glucose-Capillary: 193 mg/dL — ABNORMAL HIGH (ref 65–99)
Glucose-Capillary: 261 mg/dL — ABNORMAL HIGH (ref 65–99)

## 2017-07-02 LAB — PATHOLOGIST SMEAR REVIEW

## 2017-07-02 MED ORDER — VANCOMYCIN HCL IN DEXTROSE 750-5 MG/150ML-% IV SOLN
INTRAVENOUS | Status: AC
  Administered 2017-07-02: 750 mg via INTRAVENOUS
  Filled 2017-07-02: qty 150

## 2017-07-02 MED ORDER — GABAPENTIN 100 MG PO CAPS
100.0000 mg | ORAL_CAPSULE | Freq: Two times a day (BID) | ORAL | Status: DC
  Administered 2017-07-02 – 2017-07-10 (×17): 100 mg via ORAL
  Filled 2017-07-02 (×16): qty 1

## 2017-07-02 MED ORDER — HYDRALAZINE HCL 50 MG PO TABS
100.0000 mg | ORAL_TABLET | Freq: Three times a day (TID) | ORAL | Status: DC
  Administered 2017-07-02 – 2017-07-10 (×19): 100 mg via ORAL
  Filled 2017-07-02 (×20): qty 2

## 2017-07-02 NOTE — Progress Notes (Signed)
Occupational Therapy Session Note  Patient Details  Name: [name] MRN: [id number] Date of Birth: [birth year]  Today's Date: 07/02/2017 OT Individual Time: 1135-1200 OT Individual Time Calculation (min): 25 min   Short Term Goals: Week 2:  OT Short Term Goal 1 (Week 2): Pt will tolerate sitting on a padded BSC for 5- 10 min. OT Short Term Goal 2 (Week 2): Pt will be able to use lateral leans to pull pants up/down hips from sitting on a BSC. OT Short Term Goal 3 (Week 2): Pt will be able to tolerate standing in the [place] lift for 2 min to develop standing tolerance.   Skilled Therapeutic Interventions/Progress Updates:    OT treatment session focused on SB transfers and UB there-ex. Pt greeted semi-reclined in bed and agreeable to OT. Pt came to sitting EOB with Min A, then completed SB transfer with Mod A, and +2 to stabilize wc and board. Pt brought to therapy gym and given 1 LB weighted bar and beach ball toss. Pt competed 3 sets of 20 reps. Pt returned to room and set-up for lunch.  Therapy Documentation Precautions:  Precautions Precautions: Fall Precaution Comments: wound vac from groin, sacral decubital wound  Restrictions Weight Bearing Restrictions: Yes RLE Weight Bearing: Non weight bearing Pain: Pain Assessment Pain Assessment: 0-10 Pain Score: 6  Pain Type: Acute pain Pain Location: Leg Pain Orientation: Right Pain Descriptors / Indicators: Aching Pain Onset: On-going Pain Intervention(s): Repositioned ADL: ADL ADL Comments: see functional naviagtor  See Function Navigator for Current Functional Status.   Therapy/Group: Individual Therapy  Valma Cava 07/02/2017, 12:14 PM

## 2017-07-02 NOTE — Progress Notes (Signed)
[place] PHYSICAL MEDICINE & REHABILITATION     PROGRESS NOTE  Subjective/Complaints:  Patient seen lying in bed this morning. She states she slept well overnight. Informed by VIR yesterday the patient was not able to tolerate paracentesis and requested sedation, however, patient was later able to tolerate with removing 7 L. Patient asks for a.m. dose of gabapentin as well today.  ROS: Denies CP, SOB, nausea, vomiting, diarrhea.  Objective: Vital Signs: Blood pressure (!) 162/75, pulse 81, temperature 97.9 F (36.6 C), temperature source Oral, resp. rate 18, height 6\' 4"  (1.93 m), weight 87 kg (191 lb 12.8 oz), last menstrual period 10/09/2015, SpO2 98 %. Ir Paracentesis  Result Date: 07/01/2017 INDICATION: Patient with a history of cirrhosis and recurrent abdominal ascites. Request is made for diagnostic and therapeutic paracentesis. EXAM: ULTRASOUND GUIDED DIAGNOSTIC AND THERAPEUTIC PARACENTESIS MEDICATIONS: 2% lidocaine COMPLICATIONS: None immediate. PROCEDURE: Informed written consent was obtained from the patient after a discussion of the risks, benefits and alternatives to treatment. A timeout was performed prior to the initiation of the procedure. Initial ultrasound scanning demonstrates a large amount of ascites within the left lower abdominal quadrant. The left lower abdomen was prepped and draped in the usual sterile fashion. 2% lidocaine was used for local anesthesia. Following this, a 19 gauge, 7-cm, Yueh catheter was introduced. An ultrasound image was saved for documentation purposes. The paracentesis was performed. The catheter was removed and a dressing was applied. The patient tolerated the procedure well without immediate post procedural complication. FINDINGS: A total of approximately 7.9 L of serous fluid was removed. Samples were sent to the laboratory as requested by the clinical team. IMPRESSION: Successful ultrasound-guided paracentesis yielding 7.9 liters of peritoneal fluid.  Read by: Saverio Danker, PA-C Electronically Signed   By: Aletta Edouard M.D.   On: 07/01/2017 15:24   Recent Labs    06/30/17 1516  WBC 12.9*  HGB 8.4*  HCT 28.1*  PLT 315   Recent Labs    06/30/17 1517  NA 133*  K 5.2*  CL 96*  GLUCOSE 136*  BUN 55*  CREATININE 5.92*  CALCIUM 7.6*   CBG (last 3)  Recent Labs    07/01/17 1638 07/01/17 2051 07/02/17 0647  GLUCAP 121* 170* 161*    Wt Readings from Last 3 Encounters:  07/02/17 87 kg (191 lb 12.8 oz)  06/23/17 76.8 kg (169 lb 6.4 oz)  06/11/17 83.5 kg (184 lb 1.3 oz)    Physical Exam:  BP (!) 162/75   Pulse 81   Temp 97.9 F (36.6 C) (Oral)   Resp 18   Ht 6\' 4"  (1.93 m)   Wt 87 kg (191 lb 12.8 oz)   LMP 10/09/2015 Comment: pt on dialysis  SpO2 98%   BMI 23.35 kg/m  Constitutional: She has a sickly appearance. No distress.  HENT: Normocephalic and atraumatic.  Eyes:  and EOM are normal. No discharge.  Cardiovascular: RRR. No JVD. Respiratory: Effort normal and breath sounds normal.  GI: Bowel sounds are normal. She exhibits distension. Ascites.  Musculoskeletal: She exhibits edema.  Neurological: She is alert and oriented.  Motor: 3+-4-/5 B/l UE, LLE, RLE HF (stable) Skin: Skin is warm and dry. She is not diaphoretic. Right groin wound C/D/I, not examined today Ischial Ulcer, not examined today BKA with dressing C/D/I  Psychiatric: Her speech is normal. Blunt.  Assessment/Plan: 1. 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  Physiatrist is providing close team supervision and 24 hour management of active medical problems listed below. Physiatrist and rehab team continue to assess barriers to discharge/monitor patient progress toward functional and medical goals.  Function:  Bathing Bathing position   Position: Bed  Bathing parts Body parts bathed by patient: Right arm, Left arm, Chest,  Abdomen Body parts bathed by helper: Front perineal area, Buttocks, Right upper leg, Left upper leg, Right lower leg, Left lower leg  Bathing assist Assist Level: (40%, maximal assist level)      Upper Body Dressing/Undressing Upper body dressing   What is the patient wearing?: Pull over shirt/dress     Pull over shirt/dress - Perfomed by patient: Thread/unthread right sleeve, Thread/unthread left sleeve, Put head through opening, Pull shirt over trunk(pt self reported) Pull over shirt/dress - Perfomed by helper: Pull shirt over trunk        Upper body assist Assist Level: Set up      Lower Body Dressing/Undressing Lower body dressing   What is the patient wearing?: Pants     Pants- Performed by patient: Thread/unthread right pants leg, Thread/unthread left pants leg, Pull pants up/down(Pt self reported) Pants- Performed by helper: Thread/unthread right pants leg, Thread/unthread left pants leg, Pull pants up/down       Socks - Performed by helper: Don/doff right sock, Don/doff left sock   Shoes - Performed by helper: Don/doff right shoe, Don/doff left shoe, Fasten right, Fasten left          Lower body assist Assist for lower body dressing: Set up      Toileting Toileting     Toileting steps completed by helper: Adjust clothing prior to toileting, Performs perineal hygiene, Adjust clothing after toileting    Toileting assist     Transfers Chair/bed transfer   Chair/bed transfer method: Lateral scoot Chair/bed transfer assist level: Moderate assist (Pt 50 - 74%/lift or lower) Chair/bed transfer assistive device: Armrests, Sliding board Mechanical lift: Maximove   Locomotion Ambulation Ambulation activity did not occur: Safety/medical Editor, commissioning activity did not occur: (pt in TIS at this time) Type: Manual Max wheelchair distance: 3'(TIS with LLE but unable due to pain) Assist Level: Supervision or verbal cues   Cognition Comprehension Comprehension assist level: Follows complex conversation/direction with no assist  Expression Expression assist level: Expresses complex ideas: With no assist  Social Interaction Social Interaction assist level: Interacts appropriately with others with medication or extra time (anti-anxiety, antidepressant).  Problem Solving Problem solving assist level: Solves complex 90% of the time/cues < 10% of the time, Solves complex problems: With extra time  Memory Memory assist level: Recognizes or recalls 90% of the time/requires cueing < 10% of the time    Medical Problem List and Plan: 1.  Deficits with mobility, transfers, self-care secondary to Right BKA and wounds.   Continue CIR   2.  DVT Prophylaxis/Anticoagulation: Pharmaceutical: Lovenox 3. Pain Management:    Fentanyl 25 mcg patch    IV dilaudid transitioned to oxycodone prn.     Gabapentin increased to 100 twice a day on 2/6 4. Mood: Offer Ego support.  5. Neuropsych: This patient is capable of making decisions on her own behalf. 6. Skin/Wound Care: has evidence of malnutrition. Has been refusing nephro as well as protein supplements. Encourage intake to help promote wound healing as well as issues with ascites.    Ischial  Decub: Air mattress overlay for pressure relief measures. Added protein  supplement for malnutrition. Dakin's solution with damp to dry dressing. May need plastics consult.            Right groin wound 7. Fluids/Electrolytes/Nutrition: Renal diet with 1200 cc FR. Monitor daily weights.  8. Left Ischium decub with osteo: Daily local care with saline dressing. Need to encourage intake of supplements to promote wound healing.     Per ID, Vanc/Ceftazidime x6 weeks after discussion   Vanc level within normal limits on 2/6 9. T2DM: Monitor BS ac/hs.    Continue Lantus 20 units in the a.m.   Lantus 14 units at bedtime and increase to 19 units on 2/1, increased further over the weekend, decreased  to 20 units on 2/4, increased to 21 units on 2/5   SSI for elevated BS.    CBG (last 3)  Recent Labs    07/01/17 1638 07/01/17 2051 07/02/17 0647  GLUCAP 121* 170* 161*    Relatively controlled on 2/6 10. HTN: Monitor BP bid. Continue isosorbide bid, labetalol tid, clonidine tid and norvasc daily.  Vitals:   07/02/17 0214 07/02/17 0650  BP: (!) 184/69 (!) 162/75  Pulse: 81   Resp: 18   Temp: 97.9 F (36.6 C)   SpO2: 98%      Hydralazine 10 3 times a day started on 1/30, increased to 25 on 1/31, increased to 50 on 2/4, increased to 100 on 2/6   Hypertensive crisis overnight again 11. Pruritis: Question due to rise in phosphorous levels. Sarna tid with benadryl prn.  12. Ascites: Monitor weight daily.  Filed Weights   06/30/17 1320 06/30/17 1742 07/02/17 0214  Weight: 92 kg (202 lb 13.2 oz) 89 kg (196 lb 3.4 oz) 87 kg (191 lb 12.8 oz)    Incomplete tap on 1/29, removing 2 L, removed 7.9 L on 2/6, appreciate VIR   Fluid analysis pending 13. ESRD   Recs per Nephro- appreciate Nephro note 14. Acute on chronic anemia  Hb 8.4 on 2/4   Cont to monitor 15. Leukocytosis   WBCs 12.9 on 2/4   Afebrile  Cont to monitor   LOS (Days) 9 A FACE TO FACE EVALUATION WAS PERFORMED  Ankit Lorie Phenix 07/02/2017 8:59 AM

## 2017-07-02 NOTE — Progress Notes (Signed)
Speech Language Pathology Discharge Summary  Patient Details  Name: [name] MRN: [id number] Date of Birth: 1976/02/21  Today's Date: 07/02/2017 SLP Individual Time: 0830-0920 SLP Individual Time Calculation (min): 50 min   Skilled Therapeutic Interventions:  Pt was seen for skilled ST targeting cognitive goals.  Pt today is reporting that she feels back to baseline for cognition whereas last week she was reporting only 50% back to baseline.  Pt was mod I for recall of daily information related to therapy schedule and needed supervision cues for problem solving and working memory when completing functional math calculations from word problems and with money which pt reports to be back to baseline.  As a result, no further ST needs are indicated at this time.      Patient has met 2 of 2 long term goals.  Patient to discharge at overall Supervision;Modified Independent level.  Reasons goals not met:     Clinical Impression/Discharge Summary:  Pt is discharging from [facility] services having met 2 out of 2 short term goals.  Pt currently is mod I-supervision for semi-complex cognitive tasks and reports being back to baseline for cognition.  Pt education is complete at this time.    Care Partner:  Caregiver Able to Provide Assistance: (n/a)  Type of Caregiver Assistance: (n/a)  Recommendation:  None      Equipment: none recommended by SLP    Reasons for discharge: Treatment goals met   Patient/Family Agrees with Progress Made and Goals Achieved: Yes   Function:  Eating Eating                 Cognition Comprehension Comprehension assist level: Follows complex conversation/direction with no assist  Expression   Expression assist level: Expresses complex ideas: With no assist  Social Interaction Social Interaction assist level: Interacts appropriately with others with medication or extra time (anti-anxiety, antidepressant).  Problem Solving Problem solving assist level: Solves  complex problems: With extra time  Memory Memory assist level: More than reasonable amount of time   Emilio Math 07/02/2017, 2:34 PM

## 2017-07-02 NOTE — Progress Notes (Signed)
Physical Therapy Session Note  Patient Details  Name: [name] MRN: [id number] Date of Birth: Oct 16, 1975  Today's Date: 07/02/2017 PT Individual Time: 0805-0830 PT Individual Time Calculation (min): 25 min   Short Term Goals: Week 1:  PT Short Term Goal 1 (Week 1): pt will tolerate sitting in w/c x 1 hour, 3 x a day PT Short Term Goal 2 (Week 1): pt will perform functional transfers without use of lift equipment with +2 assist  Skilled Therapeutic Interventions/Progress Updates: Pt presented in bed sleeping easily awoken and agreeable to therapy. Pt c/o "soreness" in RLE however did not want pain meds at present. Pt performed supine to sit at EOB with minA for sequencing and modA for truncal support. Performed SB transfer to L mod/maxA with increased support from PTA to decrease shearing forces and PTA providing cues for increased use of LLE to facilitate clearance. Pt transported to day room for time management and performed UBE L1 2 min forward, 2 min backwards for endurance. Pt returned to room and remained in [place] chair with call bell within reach and needs met.      Therapy Documentation Precautions:  Precautions Precautions: Fall Precaution Comments: wound vac from groin, sacral decubital wound  Restrictions Weight Bearing Restrictions: Yes RLE Weight Bearing: Non weight bearing General:   Vital Signs:  Pain: Pain Assessment Pain Assessment: 0-10 Pain Score: 0-No pain   See Function Navigator for Current Functional Status.   Therapy/Group: Individual Therapy  Vedant Shehadeh  Daryel Kenneth, PTA  07/02/2017, 12:13 PM

## 2017-07-02 NOTE — Progress Notes (Signed)
[name] will follow until to DC to support HH needs including IV Fortaz at home as needed at DC.  If patient discharges after hours, please call [phone].   [name] 07/02/2017, 5:59 PM

## 2017-07-02 NOTE — Progress Notes (Signed)
Pharmacy Antibiotic Note  [name] is a 42 y.o. female with recent complicated hospital course 03/2017 discharged to SNF 1/16, but readmitted on the same day after fall out of bed and had possible small hemorrhagic mass on CT. She was being treated with vanc/zosyn for sacral osteo (1/17-1/23). And s/p RBKA on 1/22, and transferred to CIR on 1/28. Pharmacy is consulted to restart abx with vanc/fortaz per Dr. Johnnye Sima recommendation. Plan to treat for 6 weeks. She is afebrile, current weight 87 kg (some variations on weight 70-80kg). HD MWF, next HD 2/6  Pre-HD vancomycin level is therapeutic at 17.   Plan: Goal Pre-HD level 15-25 mcg/mL Continue Vancomycin 750 mg IV Q HD Continue Fortaz 1g IV Q 24 hrs F/u HD schedule  Height: 6\' 4"  (193 cm) Weight: 191 lb 12.8 oz (87 kg) IBW/kg (Calculated) : 82.3  Temp (24hrs), Avg:98.1 F (36.7 C), Min:97.9 F (36.6 C), Max:98.3 F (36.8 C)  Recent Labs  Lab 06/25/17 1420 06/27/17 1449 06/30/17 1516 06/30/17 1517 07/02/17 0701  WBC 11.9* 13.3* 12.9*  --   --   CREATININE 5.66* 5.92*  --  5.92*  --   VANCORANDOM  --   --   --   --  17    Estimated Creatinine Clearance: 16.2 mL/min (A) (by C-G formula based on SCr of 5.92 mg/dL (H)).    Allergies  Allergen Reactions  . Cephalexin Other (See Comments)    Reaction unknown- Childhood allergy Tolerated Ceftriaxone in the past  . Sulfamethoxazole-Trimethoprim Other (See Comments)    Unknown reaction. Pt states that she was told by her mother that she had allergy to Bactrim as a child.    Antimicrobials this admission: Tressie Ellis 1/29 >> Vancomycin 1/17>>1/23, 1/29 >> Zosyn 1/17>>1/23  Microbiology results: 1/17 BCx: neg 1/17 Peritoneal fluid: neg 1/17 MRSA PCR: neg 1/21 surgical PCR: MRSA neg, s.aureus pos 1/29 peritoneal fluid - neg 2/5 peritoneal fluid -sent   Thank you for allowing [place] to participate in this patients care.  Jens Som, PharmD Clinical phone for  07/02/2017 from 7a-3:30p: [phone] If after 3:30p, please call main pharmacy at: [phone] 07/02/2017 12:31 PM

## 2017-07-02 NOTE — Progress Notes (Signed)
[facility] Progress Note  Subjective: no new c/o  Vitals:   07/01/17 0042 07/01/17 1548 07/02/17 0214 07/02/17 0650  BP: (!) 146/59 (!) 159/67 (!) 184/69 (!) 162/75  Pulse: 73 65 81   Resp: 20 19 18    Temp: 98.4 F (36.9 C) 98.3 F (36.8 C) 97.9 F (36.6 C)   TempSrc: Axillary Oral Oral   SpO2: 93% 98% 98%   Weight:   87 kg (191 lb 12.8 oz)   Height:        Inpatient medications: . amLODipine  10 mg Oral QHS  . atorvastatin  40 mg Oral q1800  . cinacalcet  60 mg Oral Q supper  . cloNIDine  0.3 mg Oral TID  . darbepoetin (ARANESP) injection - DIALYSIS  200 mcg Intravenous Q Fri-HD  . docusate  100 mg Oral TID  . fentaNYL  25 mcg Transdermal Q72H  . ferric citrate  420 mg Oral TID WC  . gabapentin  100 mg Oral BID  . heparin  5,000 Units Subcutaneous Q8H  . hydrALAZINE  100 mg Oral Q8H  . insulin aspart  0-9 Units Subcutaneous TID WC  . insulin glargine  20 Units Subcutaneous Daily  . insulin glargine  20 Units Subcutaneous QHS  . isosorbide dinitrate  60 mg Oral BID  . labetalol  300 mg Oral TID  . metoCLOPramide  2.5 mg Oral TID AC  . multivitamin  1 tablet Oral QHS  . pantoprazole  40 mg Oral Daily  . protein supplement  1 scoop Oral TID WC  . sertraline  50 mg Oral Daily  . sevelamer carbonate  2,400 mg Oral TID WC   . cefTAZidime (FORTAZ)  IV Stopped (07/01/17 1743)  . vancomycin 750 mg (06/30/17 1630)   acetaminophen, aluminum hydroxide, bisacodyl, diphenhydrAMINE, guaiFENesin-dextromethorphan, LORazepam, oxyCODONE, phenol, polyethylene glycol, prochlorperazine **OR** prochlorperazine **OR** prochlorperazine, traMADol, traZODone  Exam: awake and alert Regular S1S2 No S3 Lungs clear Abd tight but not taut (ascites) + BS. Not tender R groin wound not examined R BKA stump with 1+ edema shrinker on and in immobilizer No LLE edema Sacral wounds covered not examined Muscle wasting throughout Dialysis Access: left AVF + bruit   Dialysis:  MWF NW 4h 31min  85.5kg 2/2.25  L AVF  Hep 2000 (no heparin for now w/ small IC bleed)   -mircera 200 every 2 wks      Impression: 1  Decub 2  BKA 3  R groin wound 4  ESRD HD mwf 5  Fall / left parietal bleed, small 6  Ascites 7  Anemia ckd, darbe 200/ wk 8  HTN chron/ severe, cont mult BP meds, avoid SBP < 140 9  Malnutrtion 10  CAD 11  DM2   Plan - HD today   Kelly Splinter MD [facility] pager [phone]   07/02/2017, 12:08 PM   Recent Labs  Lab 06/25/17 1420 06/27/17 1449 06/30/17 1517  NA 134* 133* 133*  K 4.4 4.8 5.2*  CL 94* 94* 96*  CO2 26 24 24   GLUCOSE 263* 200* 136*  BUN 46* 50* 55*  CREATININE 5.66* 5.92* 5.92*  CALCIUM 7.8* 7.4* 7.6*  PHOS 4.6 4.0 3.6   Recent Labs  Lab 06/25/17 1420 06/27/17 1449 06/30/17 1517  ALBUMIN 1.6* 1.7* 1.7*   Recent Labs  Lab 06/25/17 1420 06/27/17 1449 06/30/17 1516  WBC 11.9* 13.3* 12.9*  HGB 7.7* 7.8* 8.4*  HCT 24.9* 25.7* 28.1*  MCV 91.2 89.5 90.4  PLT  265 274 315   Iron/TIBC/Ferritin/ %Sat    Component Value Date/Time   IRON 26 (L) 06/23/2017 0708   TIBC 113 (L) 06/23/2017 0708   FERRITIN 1,050 (H) 06/23/2017 0708   IRONPCTSAT 23 06/23/2017 0708

## 2017-07-02 NOTE — Consult Note (Signed)
[place] [facility] Nurse wound follow up Wound type: Stage 4 Pressure injury Measurement: 5cm x 3.5cm x 2.5cm  Wound bed: clean, pink, moist, hypergranular tissue in the base, exposed bone (yellow-white) Drainage (amount, consistency, odor) moderate, no odor Periwound: intact, SEVERE epibole of the wound edges Dressing procedure/placement/frequency: Continue saline moist gauze dressings, if more aggressive treatment desired at some point wound need plastic surgery evaluation for management of wound edge prep prior to topical aggressive care.  [facility] Nurse team will follow along with you for weekly wound assessments.  Please notify me of any acute changes in the wounds or any new areas of concerns [place] MSN, RN,CWOCN, [place], [place]

## 2017-07-02 NOTE — Progress Notes (Signed)
Physical Therapy Note  Patient Details  Name: [name] MRN: [id number] Date of Birth: 1976/05/26 Today's Date: 07/02/2017  1300-1330, 30 min individual tx Pain: 5/10 R residual limb  Pt awaiting transport to HD.  Pt willing to participate with bedside tx.  Therapeutic exercises performed with LEs to increase strength for functional mobility; 10 x 1 each.  In R/L side lying, L/R clam shells for hip abduction ;L side lying- active assistive R hip extension and R knee extension; supine /partial R side lying for LLE unilateral bridging.   Min assist side lying> sit EOB on R.   Seated EOB, 10 x 1 each-, L long arc quad knee ext partial range, L heel raises, L ankle DF partial range, R short arc quad knee ext partial range, limited by pain.   Transporter arrived to take pt to HD.  See function navigator for current status.   Goran Olden 07/02/2017, 8:40 AM

## 2017-07-02 NOTE — Significant Event (Signed)
Continues to refuse insulins, both ss and lantus. Refusessome oral medications see MAR

## 2017-07-03 ENCOUNTER — Inpatient Hospital Stay (HOSPITAL_COMMUNITY): Payer: Medicare Other | Admitting: Physical Therapy

## 2017-07-03 ENCOUNTER — Inpatient Hospital Stay (HOSPITAL_COMMUNITY): Payer: Medicare Other | Admitting: Occupational Therapy

## 2017-07-03 ENCOUNTER — Inpatient Hospital Stay (HOSPITAL_COMMUNITY): Payer: Medicare Other

## 2017-07-03 LAB — GLUCOSE, CAPILLARY
GLUCOSE-CAPILLARY: 184 mg/dL — AB (ref 65–99)
GLUCOSE-CAPILLARY: 131 mg/dL — AB (ref 65–99)
GLUCOSE-CAPILLARY: 234 mg/dL — AB (ref 65–99)
Glucose-Capillary: 180 mg/dL — ABNORMAL HIGH (ref 65–99)
Glucose-Capillary: 250 mg/dL — ABNORMAL HIGH (ref 65–99)

## 2017-07-03 NOTE — Progress Notes (Signed)
Occupational Therapy Session Note  Patient Details  Name: [name] MRN: [id number] Date of Birth: [birth year]  Today's Date: 07/03/2017 OT Individual Time: 6256-3893 OT Individual Time Calculation (min): 42 min    Short Term Goals: Week 2:  OT Short Term Goal 1 (Week 2): Pt will tolerate sitting on a padded BSC for 5- 10 min. OT Short Term Goal 2 (Week 2): Pt will be able to use lateral leans to pull pants up/down hips from sitting on a BSC. OT Short Term Goal 3 (Week 2): Pt will be able to tolerate standing in the [place] lift for 2 min to develop standing tolerance.   Skilled Therapeutic Interventions/Progress Updates:    Pt presents in therapy gym having just finished therapy session. Pt with minimal reports of pain, reporting improvements from previous days. Completes slide board transfer with [place] w/c>mat table. Seated on mat table Pt practicing lateral leans to L and R in preparation for ADL completion. Pt leaning onto L  and R elbow and returning to midline with MinGuard for safety but does not require physical assist. Pt participating in multiple dynamic sitting activities including ball toss, horseshoes, and card matching activity, with Pt reaching outside BOS to obtain and throw/place items. Pt returns to w/c slide board with MinA from slightly elevated surface. Returned to room totalA for time management. Pt engages in seated UB strengthening chair pushups x15 reps with intermittent rest breaks. Left seated in TIS w/c end of session with needs within reach.   Therapy Documentation Precautions:  Precautions Precautions: Fall Precaution Comments: wound vac from groin, sacral decubital wound  Restrictions Weight Bearing Restrictions: Yes RLE Weight Bearing: Non weight bearing   ADL: ADL ADL Comments: see functional naviagtor  See Function Navigator for Current Functional Status.   Therapy/Group: Individual Therapy  Raymondo Band 07/03/2017, 4:01 PM

## 2017-07-03 NOTE — Progress Notes (Signed)
Physical Therapy Note  Patient Details  Name: [name] MRN: [id number] Date of Birth: March 29, 1976 Today's Date: 07/03/2017    Time: 830-925 55 minutes  1:1 No c/o pain.  Pt performs supine to sit with min A.  Sliding board transfer to w/c with mod A.  Simulated car transfer to 45'' high as pt states she has a "jeep".  Pt requires mod A for uphill, min A for downhill car transfer with sliding board.  W/c push ups 4 x 5 with pt with improved clearance but only able to maintain 2-3 seconds before fatigued.  Sit to stand in parallel bars x 5 with +2 assist, pt able to stand 10 seconds before fatigue.  PROM bilat knees to prevent contractures, pt continues to be unable to achieve full knee extension bilat.   Tirso Laws 07/03/2017, 9:25 AM

## 2017-07-03 NOTE — Progress Notes (Signed)
Physical Therapy Weekly Progress Note  Patient Details  Name: [name] MRN: [id number] Date of Birth: November 22, 1975  Beginning of progress report period: June 24, 2017 End of progress report period: July 03, 2017   Patient has met 2 of 2 short term goals.  Pt improving out of bed tolerance and transfers. Able to transfer with sliding board with mod/max A.  Pt continues with decreased strength and activity tolerance and is unable to perform effective pressure relief.  Plan to get a power w/c eval this week.  Patient continues to demonstrate the following deficits muscle weakness and decreased standing balance, decreased postural control and decreased balance strategies and therefore will continue to benefit from skilled PT intervention to increase functional independence with mobility.  Patient progressing toward long term goals..  Continue plan of care.  PT Short Term Goals Week 1:  PT Short Term Goal 1 (Week 1): pt will tolerate sitting in w/c x 1 hour, 3 x a day PT Short Term Goal 1 - Progress (Week 1): Met PT Short Term Goal 2 (Week 1): pt will perform functional transfers without use of lift equipment with +2 assist PT Short Term Goal 2 - Progress (Week 1): Met Week 2:  PT Short Term Goal 1 (Week 2): =LTG  Skilled Therapeutic Interventions/Progress Updates:  Ambulation/gait training;Community reintegration;DME/adaptive equipment instruction;Stair training;Neuromuscular re-education;UE/LE Strength taining/ROM;Wheelchair propulsion/positioning;UE/LE Coordination activities;Therapeutic Activities;Pain management;Discharge planning;Balance/vestibular training;Cognitive remediation/compensation;Functional mobility training;Patient/family education;Splinting/orthotics;Therapeutic Exercise    See Function Navigator for Current Functional Status.  Sarinah Doetsch 07/03/2017, 8:14 AM

## 2017-07-03 NOTE — Progress Notes (Signed)
Pt refusing her medications- including her insulin and her heparin sq. Risks and benefits reviewed pt still refuses medication

## 2017-07-03 NOTE — Progress Notes (Signed)
[facility]  Subjective  - doing much better.   Objective (!) 197/81 75 98.2 F (36.8 C) (Oral) 18 100%  Intake/Output Summary (Last 24 hours) at 07/03/2017 0840 Last data filed at 07/02/2017 2000 Gross per 24 hour  Intake 600 ml  Output 2500 ml  Net -1900 ml    Right groin healing very well with 1 mm pin point drainage.  No erythema or edema. Right BKA healing well with increased warmth to touch.   Scab cover 60% of incision, no active drainage.  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   Her groin has improved with now 95% healed incision.  The right BKA is continuing to heal and showing good evidence of viability.    Roxy Horseman 07/03/2017 8:40 AM --  Laboratory Lab Results: Recent Labs    06/30/17 1516 07/02/17 1325  WBC 12.9* 12.3*  HGB 8.4* 8.1*  HCT 28.1* 27.3*  PLT 315 300   BMET Recent Labs    06/30/17 1517 07/02/17 1325  NA 133* 135  K 5.2* 4.7  CL 96* 95*  CO2 24 24  GLUCOSE 136* 228*  BUN 55* 51*  CREATININE 5.92* 5.48*  CALCIUM 7.6* 7.7*    COAG Lab Results  Component Value Date   INR 1.14 06/17/2017   INR 1.20 06/11/2017   INR 1.19 05/09/2017   No results found for: PTT

## 2017-07-03 NOTE — Progress Notes (Signed)
[place] PHYSICAL MEDICINE & REHABILITATION     PROGRESS NOTE  Subjective/Complaints:  Pt seen lying in bed this AM.  She slept well overnight.    ROS: Denies CP, SOB, nausea, vomiting, diarrhea.  Objective: Vital Signs: Blood pressure (!) 197/81, pulse 75, temperature 98.2 F (36.8 C), temperature source Oral, resp. rate 18, height 6\' 4"  (1.93 m), weight 82.5 kg (181 lb 14.1 oz), last menstrual period 10/09/2015, SpO2 100 %. Ir Paracentesis  Result Date: 07/01/2017 INDICATION: Patient with a history of cirrhosis and recurrent abdominal ascites. Request is made for diagnostic and therapeutic paracentesis. EXAM: ULTRASOUND GUIDED DIAGNOSTIC AND THERAPEUTIC PARACENTESIS MEDICATIONS: 2% lidocaine COMPLICATIONS: None immediate. PROCEDURE: Informed written consent was obtained from the patient after a discussion of the risks, benefits and alternatives to treatment. A timeout was performed prior to the initiation of the procedure. Initial ultrasound scanning demonstrates a large amount of ascites within the left lower abdominal quadrant. The left lower abdomen was prepped and draped in the usual sterile fashion. 2% lidocaine was used for local anesthesia. Following this, a 19 gauge, 7-cm, Yueh catheter was introduced. An ultrasound image was saved for documentation purposes. The paracentesis was performed. The catheter was removed and a dressing was applied. The patient tolerated the procedure well without immediate post procedural complication. FINDINGS: A total of approximately 7.9 L of serous fluid was removed. Samples were sent to the laboratory as requested by the clinical team. IMPRESSION: Successful ultrasound-guided paracentesis yielding 7.9 liters of peritoneal fluid. Read by: Saverio Danker, PA-C Electronically Signed   By: Aletta Edouard M.D.   On: 07/01/2017 15:24   Recent Labs    06/30/17 1516 07/02/17 1325  WBC 12.9* 12.3*  HGB 8.4* 8.1*  HCT 28.1* 27.3*  PLT 315 300   Recent Labs     06/30/17 1517 07/02/17 1325  NA 133* 135  K 5.2* 4.7  CL 96* 95*  GLUCOSE 136* 228*  BUN 55* 51*  CREATININE 5.92* 5.48*  CALCIUM 7.6* 7.7*   CBG (last 3)  Recent Labs    07/02/17 1915 07/02/17 2059 07/03/17 0645  GLUCAP 176* 261* 184*    Wt Readings from Last 3 Encounters:  07/02/17 82.5 kg (181 lb 14.1 oz)  06/23/17 76.8 kg (169 lb 6.4 oz)  06/11/17 83.5 kg (184 lb 1.3 oz)    Physical Exam:  BP (!) 197/81 (BP Location: Right Arm)   Pulse 75   Temp 98.2 F (36.8 C) (Oral)   Resp 18   Ht 6\' 4"  (1.93 m)   Wt 82.5 kg (181 lb 14.1 oz)   LMP 10/09/2015 Comment: pt on dialysis  SpO2 100%   BMI 22.14 kg/m  Constitutional: She has a sickly appearance. No distress.  HENT: Normocephalic and atraumatic.  Eyes:  and EOM are normal. No discharge.  Cardiovascular: RRR. No JVD. Respiratory: Effort normal and breath sounds normal.  GI: Bowel sounds are normal. She exhibits distension. Ascites.  Musculoskeletal: She exhibits mild edema.  Neurological: She is alert and oriented.  Motor: 3+-4-/5 B/l UE, LLE, RLE HF (slowly improving) Skin: Skin is warm and dry. She is not diaphoretic. Right groin wound C/D/I, not examined today Ischial Ulcer, not examined today BKA with dried blistering along lateral incision margins Psychiatric: Her speech is normal. Blunt.  Assessment/Plan: 1. 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 Physiatrist is providing close team supervision and 24 hour management of  active medical problems listed below. Physiatrist and rehab team continue to assess barriers to discharge/monitor patient progress toward functional and medical goals.  Function:  Bathing Bathing position   Position: Bed  Bathing parts Body parts bathed by patient: Right arm, Left arm, Chest, Abdomen Body parts bathed by helper: Front perineal area, Buttocks, Right  upper leg, Left upper leg, Right lower leg, Left lower leg  Bathing assist Assist Level: (40%, maximal assist level)      Upper Body Dressing/Undressing Upper body dressing   What is the patient wearing?: Pull over shirt/dress     Pull over shirt/dress - Perfomed by patient: Thread/unthread right sleeve, Thread/unthread left sleeve, Put head through opening, Pull shirt over trunk(pt self reported) Pull over shirt/dress - Perfomed by helper: Pull shirt over trunk        Upper body assist Assist Level: Set up      Lower Body Dressing/Undressing Lower body dressing   What is the patient wearing?: Pants     Pants- Performed by patient: Thread/unthread right pants leg, Thread/unthread left pants leg, Pull pants up/down(Pt self reported) Pants- Performed by helper: Thread/unthread right pants leg, Thread/unthread left pants leg, Pull pants up/down       Socks - Performed by helper: Don/doff right sock, Don/doff left sock   Shoes - Performed by helper: Don/doff right shoe, Don/doff left shoe, Fasten right, Fasten left          Lower body assist Assist for lower body dressing: Set up      Toileting Toileting     Toileting steps completed by helper: Adjust clothing prior to toileting, Performs perineal hygiene, Adjust clothing after toileting    Toileting assist     Transfers Chair/bed transfer   Chair/bed transfer method: Lateral scoot Chair/bed transfer assist level: Moderate assist (Pt 50 - 74%/lift or lower) Chair/bed transfer assistive device: Armrests, Sliding board Mechanical lift: Maximove   Locomotion Ambulation Ambulation activity did not occur: Safety/medical Editor, commissioning activity did not occur: (pt in TIS at this time) Type: Manual Max wheelchair distance: 3'(TIS with LLE but unable due to pain) Assist Level: Supervision or verbal cues  Cognition Comprehension Comprehension assist level: Follows complex conversation/direction  with no assist  Expression Expression assist level: Expresses complex ideas: With no assist  Social Interaction Social Interaction assist level: Interacts appropriately with others with medication or extra time (anti-anxiety, antidepressant).  Problem Solving Problem solving assist level: Solves complex problems: With extra time  Memory Memory assist level: More than reasonable amount of time    Medical Problem List and Plan: 1.  Deficits with mobility, transfers, self-care secondary to Right BKA and wounds.   Continue CIR   2.  DVT Prophylaxis/Anticoagulation: Pharmaceutical: Lovenox 3. Pain Management:    Fentanyl 25 mcg patch    IV dilaudid transitioned to oxycodone prn.     Gabapentin increased to 100 twice a day on 2/6 4. Mood: Offer Ego support.  5. Neuropsych: This patient is capable of making decisions on her own behalf. 6. Skin/Wound Care: has evidence of malnutrition. Has been refusing nephro as well as protein supplements. Encourage intake to help promote wound healing as well as issues with ascites.    Ischial  Decub: Air mattress overlay for pressure relief measures. Added protein supplement for malnutrition. Dakin's solution with damp to dry dressing. May need plastics consult.            Right groin wound 7.  Fluids/Electrolytes/Nutrition: Renal diet with 1200 cc FR. Monitor daily weights.  8. Left Ischium decub with osteo: Daily local care with saline dressing. Need to encourage intake of supplements to promote wound healing.     Per ID, Vanc/Ceftazidime x6 weeks after discussion   Vanc level within normal limits on 2/6 9. T2DM: Monitor BS ac/hs.    Continue Lantus 20 units in the a.m.   Lantus 14 units at bedtime and increase to 19 units on 2/1, increased further over the weekend, decreased to 20 units on 2/4, increased to 21 units on 2/5   SSI for elevated BS.    CBG (last 3)  Recent Labs    07/02/17 1915 07/02/17 2059 07/03/17 0645  GLUCAP 176* 261* 184*     Labile, however, pt intermittently refusing medications 10. HTN: Monitor BP bid. Continue isosorbide bid, labetalol tid, clonidine tid and norvasc daily.  Vitals:   07/02/17 2240 07/03/17 0555  BP: (!) 183/87 (!) 197/81  Pulse:  75  Resp:  18  Temp:  98.2 F (36.8 C)  SpO2:  100%     Hydralazine 10 3 times a day started on 1/30, increased to 25 on 1/31, increased to 50 on 2/4, increased to 100 on 2/6   Hypertensive crisis overnight again, remains uncontrolled with intermittent missing doses 11. Pruritis: Question due to rise in phosphorous levels. Sarna tid with benadryl prn.  12. Ascites: Monitor weight daily.  Filed Weights   07/02/17 0214 07/02/17 1349 07/02/17 1749  Weight: 87 kg (191 lb 12.8 oz) 85 kg (187 lb 6.3 oz) 82.5 kg (181 lb 14.1 oz)    Incomplete tap on 1/29, removing 2 L, removed 7.9 L on 2/6, appreciate VIR   Fluid analysis remains pending 13. ESRD   Recs per Nephro- appreciate Nephro note 14. Acute on chronic anemia  Hb 8.1 on 2/6   Cont to monitor 15. Leukocytosis   WBCs 12.3 on 2/6   Afebrile  Cont to monitor   LOS (Days) 10 A FACE TO FACE EVALUATION WAS PERFORMED  Braylea Brancato Lorie Phenix 07/03/2017 9:03 AM

## 2017-07-03 NOTE — Progress Notes (Signed)
Occupational Therapy Session Note  Patient Details  Name: [name] MRN: [id number] Date of Birth: Jul 25, 1975  Today's Date: 07/03/2017 OT Individual Time: 6203-5597 OT Individual Time Calculation (min): 30 min    Short Term Goals: Week 1:  OT Short Term Goal 1 (Week 1): Pt will tolerate sitting EOB for 10 min to engage in sitting bathing and dressing. OT Short Term Goal 1 - Progress (Week 1): Met OT Short Term Goal 2 (Week 1): Pt will be able to loop pants over feet with min A. OT Short Term Goal 2 - Progress (Week 1): Met OT Short Term Goal 3 (Week 1): Pt will be able to pull pants over hips with lateral leans with min A. OT Short Term Goal 3 - Progress (Week 1): Met OT Short Term Goal 4 (Week 1): Pt will tolerate standing in [place] lift for 2-3 minutes at a time to develop standing tolerance.  OT Short Term Goal 4 - Progress (Week 1): Progressing toward goal OT Short Term Goal 5 (Week 1): Pt will be able to tolerate sitting on a padded BSC for 5-10 min.  OT Short Term Goal 5 - Progress (Week 1): Progressing toward goal Week 2:  OT Short Term Goal 1 (Week 2): Pt will tolerate sitting on a padded BSC for 5- 10 min. OT Short Term Goal 2 (Week 2): Pt will be able to use lateral leans to pull pants up/down hips from sitting on a BSC. OT Short Term Goal 3 (Week 2): Pt will be able to tolerate standing in the [place] lift for 2 min to develop standing tolerance.   Skilled Therapeutic Interventions/Progress Updates:    1:1 Therapeutic activity: Pt able to perform level transfer w/c to mat with setup of board with min A with extra time. On the EOB perform 15 push ups on blocks. Pt able to transition into prone position and tolerate for ~3-4 min for prolonged hip extension stretch and was encouraged by increasing ability to extend right knee in this position with gravity assisting. In sidelying on bilateral sides performed hip exercises promoting  Hip and core strengthening.  Returned to upright  sitting position on EOB and performed UB exercises to assist with her ability to push up in transfers (clearing her bottom) with 2 lb weighted bar. Pass off to the next therapist.   Therapy Documentation Precautions:  Precautions Precautions: Fall Precaution Comments: wound vac from groin, sacral decubital wound  Restrictions Weight Bearing Restrictions: Yes RLE Weight Bearing: Non weight bearing General:   Vital Signs: Therapy Vitals Temp: 97.7 F (36.5 C) Temp Source: Oral Pulse Rate: 78 Resp: 17 BP: 113/67 Patient Position (if appropriate): Sitting Oxygen Therapy SpO2: 98 % O2 Device: Not Delivered Pain: Pain Assessment Pain Assessment: 0-10 Pain Score: 7  Pain Location: Back Pain Descriptors / Indicators: Aching Patients Stated Pain Goal: 4 Pain Intervention(s): Medication (See eMAR);Repositioned ADL: ADL ADL Comments: see functional naviagtor  See Function Navigator for Current Functional Status.   Therapy/Group: Individual Therapy  Willeen Cass [facility] 07/03/2017, 3:26 PM

## 2017-07-03 NOTE — Progress Notes (Signed)
Physical Therapy Note  Patient Details  Name: [name] MRN: [id number] Date of Birth: 03/15/1976 Today's Date: 07/03/2017  1415- 1500, 45 min individual tx Pain: 5/10 residual limb. premdicated  Therapeutic exercise performed with LE to increase strength for functional mobility: in L side lying- 12 x 1 R clam shells, modified abdominal crunches.  Bed mobility to sit up EOB on R with min assist.  Slide board transfer to L from deflated bed> w/c.  W/c propulsion using bil UEs, LLE on level tile x 50' x 2, limited by quad pain/fatigue.    Pt requested practicing simulated car transfer to 29" ht seat, per family's info of Jeep seat ht.  Mod assist to enter car, uphill, min cues for head/hips technique, min assist to exit, downhill, min cues as above.  Seated Therapeutic exercises performed with LEs to increase strength for functional mobility: R/L long arc quad knee ext with 2 second hold at end range, bil hip abduction against bright green Theraband.    Pt requested sitting up in w/c; left resting with needs at hand and [name], RN informed.    [name] 07/03/2017, 2:24 PM

## 2017-07-03 NOTE — Progress Notes (Signed)
Pt took her abx this evening however she did continue to refuse her heparin sq risks and benefits reviewed pt still refuses.

## 2017-07-04 ENCOUNTER — Inpatient Hospital Stay (HOSPITAL_COMMUNITY): Payer: Medicare Other

## 2017-07-04 LAB — GLUCOSE, CAPILLARY
GLUCOSE-CAPILLARY: 98 mg/dL (ref 65–99)
GLUCOSE-CAPILLARY: 105 mg/dL — AB (ref 65–99)
GLUCOSE-CAPILLARY: 188 mg/dL — AB (ref 65–99)
Glucose-Capillary: 88 mg/dL (ref 65–99)

## 2017-07-04 LAB — RENAL FUNCTION PANEL
ANION GAP: 15 (ref 5–15)
Albumin: 1.7 g/dL — ABNORMAL LOW (ref 3.5–5.0)
BUN: 44 mg/dL — ABNORMAL HIGH (ref 6–20)
CALCIUM: 7.9 mg/dL — AB (ref 8.9–10.3)
CO2: 26 mmol/L (ref 22–32)
CREATININE: 4.97 mg/dL — AB (ref 0.44–1.00)
Chloride: 94 mmol/L — ABNORMAL LOW (ref 101–111)
GFR, EST AFRICAN AMERICAN: 12 mL/min — AB (ref >60)
GFR, EST NON AFRICAN AMERICAN: 10 mL/min — AB (ref >60)
Glucose, Bld: 96 mg/dL (ref 65–99)
Phosphorus: 4.4 mg/dL (ref 2.5–4.6)
Potassium: 4.8 mmol/L (ref 3.5–5.1)
SODIUM: 135 mmol/L (ref 135–145)

## 2017-07-04 LAB — CBC
HCT: 28 % — ABNORMAL LOW (ref 36.0–46.0)
HEMOGLOBIN: 8.5 g/dL — AB (ref 12.0–15.0)
MCH: 27.8 pg (ref 26.0–34.0)
MCHC: 30.4 g/dL (ref 30.0–36.0)
MCV: 91.5 fL (ref 78.0–100.0)
PLATELETS: 262 10*3/uL (ref 150–400)
RBC: 3.06 MIL/uL — AB (ref 3.87–5.11)
RDW: 17.7 % — ABNORMAL HIGH (ref 11.5–15.5)
WBC: 12.5 10*3/uL — AB (ref 4.0–10.5)

## 2017-07-04 MED ORDER — VANCOMYCIN HCL IN DEXTROSE 750-5 MG/150ML-% IV SOLN
INTRAVENOUS | Status: AC
  Filled 2017-07-04: qty 150

## 2017-07-04 MED ORDER — ACETAMINOPHEN 325 MG PO TABS
ORAL_TABLET | ORAL | Status: AC
  Filled 2017-07-04: qty 2

## 2017-07-04 MED ORDER — DARBEPOETIN ALFA 200 MCG/0.4ML IJ SOSY
PREFILLED_SYRINGE | INTRAMUSCULAR | Status: AC
  Administered 2017-07-04: 200 ug via INTRAVENOUS
  Filled 2017-07-04: qty 0.4

## 2017-07-04 MED ORDER — DOCUSATE SODIUM 100 MG PO CAPS
100.0000 mg | ORAL_CAPSULE | Freq: Three times a day (TID) | ORAL | Status: DC
  Administered 2017-07-04 – 2017-07-10 (×15): 100 mg via ORAL
  Filled 2017-07-04 (×15): qty 1

## 2017-07-04 NOTE — Progress Notes (Signed)
Physical Therapy Session Note  Patient Details  Name: [name] MRN: [id number] Date of Birth: [birth year]  Today's Date: 07/04/2017 PT Individual Time: 0800-0827 PT Individual Time Calculation (min): 27 min   Short Term Goals: Week 2:  PT Short Term Goal 1 (Week 2): =LTG  Skilled Therapeutic Interventions/Progress Updates:    Pt supine in bed upon PT arrival, agreeable to therapy tx and reports pain 4/10 in R LE. Pt transferred from supine>sitting EOB with min assist and verbal cues for techniques. Pt performed slideboard transfer from bed>w/c with min assist, verbal cues for techniques. Pt transported from room>gym. Pt performed car transfer (28 inches) to simulate personal car using slideboard and mod assist, verbal cues for techniques and manual facilitation for weightbearing through L LE. Pt seated in w/c perform 2 x 5 w/c push ups with emphasis on weightbearing through L LE to lift hips, able to sustain hold only for about 2-3 seconds. Pt transported back to room and left seated in w/c with needs in reach.   Therapy Documentation Precautions:  Precautions Precautions: Fall Precaution Comments: wound vac from groin, sacral decubital wound  Restrictions Weight Bearing Restrictions: Yes RLE Weight Bearing: Non weight bearing   See Function Navigator for Current Functional Status.   Therapy/Group: Individual Therapy  Netta Corrigan, PT, DPT 07/04/2017, 8:28 AM

## 2017-07-04 NOTE — Progress Notes (Signed)
Occupational Therapy Session Note  Patient Details  Name: [name] MRN: [id number] Date of Birth: 02-16-1976  Today's Date: 07/04/2017 OT Individual Time: 1000-1055 OT Individual Time Calculation (min): 55 min     Skilled Therapeutic Interventions/Progress Updates:    1:1. Pt with c/o pain in sacral wound but it is "tolerable." Pt declines medicine d/t drowsy side effects. Pt practices slide board transfer w/c to/from padded BSC with set up for board placement and VC for checking placement of board in between legs. Pt completes slide board transfer to/from mat placing board by herself and 1 VC for locking brakes prior to completing transfer. Pt leans laterally onto elbows to obtain clothespins, cross midline and reach above head level on high target with BUE. Pt able to lean onto elbow and recover to midline with supervision and VC to push through elbow to sit upright. OT propels w/c to outside courtyard for fresh air while completing BUE therex in all planes of motion for B shoulders flex/ext, into/ext rotation, ab/adduction, elbows flex/ext and chest press/overhead press 1x15 using 3# dumbbells for improved strength for ADLs and functional transfers.   Therapy Documentation Precautions:  Precautions Precautions: Fall Precaution Comments: wound vac from groin, sacral decubital wound  Restrictions Weight Bearing Restrictions: Yes RLE Weight Bearing: Non weight bearing  See Function Navigator for Current Functional Status.   Therapy/Group: Individual Therapy  Tonny Branch 07/04/2017, 10:55 AM

## 2017-07-04 NOTE — Progress Notes (Signed)
Social Work Patient ID: [name], female   DOB: [birth year], 42 y.o.   MRN: [id number]     Derrius Furtick, Levin Erp  Social Worker    Patient Care Conference  Signed  Date of Service:  07/04/2017 12:36 AM          Signed          '[]' Hide copied text  '[]' Hover for details   Inpatient RehabilitationTeam Conference and Plan of Care Update Date: 07/02/2017   Time: 2:40 PM      Patient Name: [name]      Medical Record Number: [id number]  Date of Birth: 10/22/1975 Sex: Female         Room/Bed: 4M07C/4M07C-01 Payor Info: Payor: MEDICARE / Plan: MEDICARE PART A AND B / Product Type: *No Product type* /     Admitting Diagnosis: L BKA, hs mef  Admit Date/Time:  06/23/2017  4:23 PM Admission Comments: No comment available    Primary Diagnosis:  <principal problem not specified> Principal Problem: <principal problem not specified>       Patient Active Problem List    Diagnosis Date Noted  . Labile blood glucose    . Hypertensive crisis    . Leukocytosis    . Hypoglycemia    . Postoperative pain    . Phantom limb pain ([place])    . Subacute osteomyelitis, other site ([place])    . Pain    . Diabetic osteomyelitis ([place])    . Acute blood loss anemia    . Anemia of chronic disease    . ESRD on dialysis ([place])    . Benign essential HTN    . Diabetes mellitus type 2 in nonobese (HCC)    . Unilateral complete BKA, right, initial encounter ([place]) 06/23/2017  . Post-operative pain    . Unilateral complete BKA, right, sequela ([place])    . Stage IV pressure ulcer of sacral region ([place]) 06/16/2017  . Wound healing, delayed    . Sacral osteomyelitis ([place])    . Chronic renal failure    . Brain mass 06/11/2017  . Fall 06/11/2017  . 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 History of ETT    . Pressure injury of skin 04/22/2017  . Acute respiratory failure with hypoxia ([place])    . Cardiac arrest, cause unspecified ([place])    . Non-healing ulcer ([place])     . Hypertension 03/24/2017  . Hypertensive urgency 03/24/2017  . Cellulitis in diabetic foot ([place]) 03/24/2017  . Cellulitis 03/24/2017  . Chronic pain 03/24/2017  . Diabetic ulcer of lower leg ([place]) 03/24/2017  . Gangrene of toe of right foot ([place])    . Acute on chronic diastolic CHF (congestive heart failure) ([place]) 02/22/2016  . Fluid overload    . Pericardial effusion    . Ascites 02/15/2016  . Uncontrolled diabetes mellitus with end-stage renal disease ([place]) 02/16/2015  . Bimalleolar ankle fracture 02/12/2015  . CAD (coronary artery disease) 02/09/2015  . PAD (peripheral artery disease) ([place])    . Gastroparesis    . Chronic anemia    . Menorrhagia with regular cycle 01/04/2014  . Dysmenorrhea 01/04/2014  . Nausea & vomiting 10/16/2011  . History of noncompliance with medical treatment 05/09/2011  . Chronic UTI 05/09/2011  . CIN III (cervical intraepithelial neoplasia grade III) with severe dysplasia    . History of abscesses in groins 12/06/2010  . End stage renal disease on dialysis ([place])    .  Coronary artery disease    . Hyperlipidemia    . 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 NONDEPENDENT TOBACCO USE DISORDER 12/20/2009  . Essential hypertension, benign 12/20/2009  . NEPHROTIC SYNDROME 12/20/2009      Expected Discharge Date: Expected Discharge Date: 07/10/17   Team Members Present: Physician leading conference: Dr. Delice Lesch Social Worker Present: Alfonse Alpers, LCSW Nurse Present: Junius Creamer, RN PT Present: Roderic Ovens, PT OT Present: Willeen Cass, OT SLP Present: Windell Moulding, SLP PPS Coordinator present : Daiva Nakayama, RN, CRRN       Current Status/Progress Goal Weekly Team Focus  Medical     Deficits with mobility, transfers, self-care secondary to Right BKA and wounds  Improve wounds, mobility, transfers, compliance, safety, ascites, osteo  See above   Bowel/Bladder     pt oliguric incontient of stool LBM 07/01/17  less incidents of incontinence continue  HD will be continent of bowel with min A free from constipation  toilet pt q2h and prn pt will be free of constipation   Swallow/Nutrition/ Hydration               ADL's     Pt completing slide board transfers with [place]; has been completing bathing/dressing ADLs prior to therapy sessions at bed level; performs lateral leans with ModA   min A overall   transfers, promoting knee extension; ADL retraining; OOB tolerance; pain management    Mobility     mod A sliding board, unable to stand  min A  activity tolerance, w/c mobility   Communication               Safety/Cognition/ Behavioral Observations   supervision-mod I; pt reports being back at baseline for cognition   mod I   education complete, goals met, pt back to baseline for cognition    Pain     pt started on fentanyl patch 43mg continues with Oxycodone IR and tylenol prn   pt's pain will be <=3/10 with min A  assess pain q4h and prn, assess for effectiveness of analgesics notify MD for pain that is not relieved   Skin     stage 4 ishcial area NS wet-dry dressing, R BKA with DSD, R groin hydrogel with DSD sacral wounds with foam   maintain skin without further breakdown, wounds to show signs of improvement/healing surgical incision free of infection with min A  assess skin qshift and prn continue treatments as ordered      Rehab Goals Patient on target to meet rehab goals: Yes Rehab Goals Revised: none *See Care Plan and progress notes for long and short-term goals.      Barriers to Discharge   Current Status/Progress Possible Resolutions Date Resolved   Physician     Medical stability;Decreased caregiver support;Lack of/limited family support;Hemodialysis;Medication compliance;Other (comments);IV antibiotics  Ascites  See above  Therapies, HD recs per Nephro, Paracentesis as tolerated, follow labs, optimize pain meds, IV abx      Nursing                 PT                    OT                 SLP            SW                Discharge Planning/Teaching Needs:  Pt is hopeful to return to  her home where her husband and dtr can provide care, as long as she can reach manageable level of care.    Family coming for education on 07-08-17 to make sure that they can manage her care at home, but they are committed to having her at home.   Team Discussion:  Pt with chronic pain - Dr. Posey Pronto increased her gabapentin.  Pt will need IV antibiotics for several weeks at home.  Dr. Posey Pronto is adjusting her BP and DM medications, as well.  Pt has 8L tapped 07-01-17.  RN discussed pt's stage 4 wound on her bottom.  Also, pt is refusing her heparin, novolog, and lantus.  Pt with left foot ulcer.  Will need specialty bed at home.  PT talking with pt about getting a specialty chair vs. Prosthetic. Pt is min A bed level with OT.    Revisions to Treatment Plan:  none    Continued Need for Acute Rehabilitation Level of Care: The patient requires daily medical management by a physician with specialized training in physical medicine and rehabilitation for the following conditions: Daily direction of a multidisciplinary physical rehabilitation program to ensure safe treatment while eliciting the highest outcome that is of practical value to the patient.: Yes Daily medical management of patient stability for increased activity during participation in an intensive rehabilitation regime.: Yes Daily analysis of laboratory values and/or radiology reports with any subsequent need for medication adjustment of medical intervention for : Wound care problems;Diabetes problems;Blood pressure problems;Post surgical problems;Renal problems;Other   Gatha Mcnulty, Silvestre Mesi 07/04/2017, 12:36 AM

## 2017-07-04 NOTE — Progress Notes (Signed)
[facility] Progress Note  Subjective: up in motorized WC, no new c/o, says she might be dc'd late next week  Vitals:   07/03/17 1320 07/03/17 1708 07/03/17 2100 07/04/17 0440  BP: 113/67 129/74 (!) 185/70 (!) 192/86  Pulse: 78 79 69 76  Resp: 17 18 18 18   Temp: 97.7 F (36.5 C) 98.1 F (36.7 C) 98 F (36.7 C) 98 F (36.7 C)  TempSrc: Oral Oral Oral Oral  SpO2: 98% 93% 97% 95%  Weight:      Height:        Inpatient medications: . amLODipine  10 mg Oral QHS  . atorvastatin  40 mg Oral q1800  . cinacalcet  60 mg Oral Q supper  . cloNIDine  0.3 mg Oral TID  . darbepoetin (ARANESP) injection - DIALYSIS  200 mcg Intravenous Q Fri-HD  . docusate sodium  100 mg Oral TID  . fentaNYL  25 mcg Transdermal Q72H  . ferric citrate  420 mg Oral TID WC  . gabapentin  100 mg Oral BID  . heparin  5,000 Units Subcutaneous Q8H  . hydrALAZINE  100 mg Oral Q8H  . insulin aspart  0-9 Units Subcutaneous TID WC  . insulin glargine  20 Units Subcutaneous Daily  . insulin glargine  20 Units Subcutaneous QHS  . isosorbide dinitrate  60 mg Oral BID  . labetalol  300 mg Oral TID  . metoCLOPramide  2.5 mg Oral TID AC  . multivitamin  1 tablet Oral QHS  . pantoprazole  40 mg Oral Daily  . protein supplement  1 scoop Oral TID WC  . sertraline  50 mg Oral Daily  . sevelamer carbonate  2,400 mg Oral TID WC   . cefTAZidime (FORTAZ)  IV Stopped (07/03/17 1429)  . vancomycin 750 mg (07/02/17 1710)   acetaminophen, aluminum hydroxide, bisacodyl, diphenhydrAMINE, guaiFENesin-dextromethorphan, LORazepam, oxyCODONE, phenol, polyethylene glycol, prochlorperazine **OR** prochlorperazine **OR** prochlorperazine, traMADol, traZODone  Exam: awake and alert Regular S1S2 No S3 Lungs clear Abd tight but not taut (ascites) + BS. Not tender R groin wound not examined R BKA stump with 1+ edema shrinker on and in immobilizer No LLE edema Sacral wounds covered not examined Muscle wasting  throughout Dialysis Access: left AVF + bruit   Dialysis: MWF [facility] 4h 74min  85.5kg 2/2.25  L AVF  Hep 2000 (no heparin for now w/ small IC bleed)   -mircera 200 every 2 wks      Impression: 1  Decub 2  BKA 3  R groin wound 4  ESRD HD mwf, avg UF 2-3kg here 5  Fall / left parietal bleed, small 6  Volume under dry wt, lower at dc 7  Anemia ckd, darbe 200/ wk 8  HTN chron/ severe, cont mult BP meds, avoid SBP < 140 9  Malnutrtion 10  CAD 11  DM2   Plan - HD today   Kelly Splinter MD [facility] pager [phone]   07/04/2017, 1:38 PM   Recent Labs  Lab 06/27/17 1449 06/30/17 1517 07/02/17 1325  NA 133* 133* 135  K 4.8 5.2* 4.7  CL 94* 96* 95*  CO2 24 24 24   GLUCOSE 200* 136* 228*  BUN 50* 55* 51*  CREATININE 5.92* 5.92* 5.48*  CALCIUM 7.4* 7.6* 7.7*  PHOS 4.0 3.6 3.6   Recent Labs  Lab 06/27/17 1449 06/30/17 1517 07/02/17 1325  ALBUMIN 1.7* 1.7* 1.6*   Recent Labs  Lab 06/27/17 1449 06/30/17 1516 07/02/17 1325  WBC 13.3*  12.9* 12.3*  HGB 7.8* 8.4* 8.1*  HCT 25.7* 28.1* 27.3*  MCV 89.5 90.4 91.6  PLT 274 315 300   Iron/TIBC/Ferritin/ %Sat    Component Value Date/Time   IRON 26 (L) 06/23/2017 0708   TIBC 113 (L) 06/23/2017 0708   FERRITIN 1,050 (H) 06/23/2017 0708   IRONPCTSAT 23 06/23/2017 0708

## 2017-07-04 NOTE — Patient Care Conference (Signed)
Inpatient RehabilitationTeam Conference and Plan of Care Update Date: 07/02/2017   Time: 2:40 PM    Patient Name: [name]      Medical Record Number: [id number]  Date of Birth: 08/21/1975 Sex: Female         Room/Bed: 4M07C/4M07C-01 Payor Info: Payor: MEDICARE / Plan: MEDICARE PART A AND B / Product Type: *No Product type* /    Admitting Diagnosis: L BKA, hs mef  Admit Date/Time:  06/23/2017  4:23 PM Admission Comments: No comment available   Primary Diagnosis:  <principal problem not specified> Principal Problem: <principal problem not specified>  Patient Active Problem List   Diagnosis Date Noted  . Labile blood glucose   . Hypertensive crisis   . Leukocytosis   . Hypoglycemia   . Postoperative pain   . Phantom limb pain (HCC)   . Subacute osteomyelitis, other site (HCC)   . Pain   . Diabetic osteomyelitis (HCC)   . Acute blood loss anemia   . Anemia of chronic disease   . ESRD on dialysis (HCC)   . Benign essential HTN   . Diabetes mellitus type 2 in nonobese (HCC)   . Unilateral complete BKA, right, initial encounter (HCC) 06/23/2017  . Post-operative pain   . Unilateral complete BKA, right, sequela (HCC)   . Stage IV pressure ulcer of sacral region (HCC) 06/16/2017  . Wound healing, delayed   . Sacral osteomyelitis (HCC)   . Chronic renal failure   . Brain mass 06/11/2017  . Fall 06/11/2017  . 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 History of ETT   . Pressure injury of skin 04/22/2017  . Acute respiratory failure with hypoxia (HCC)   . Cardiac arrest, cause unspecified (HCC)   . Non-healing ulcer (HCC)   . Hypertension 03/24/2017  . Hypertensive urgency 03/24/2017  . Cellulitis in diabetic foot (HCC) 03/24/2017  . Cellulitis 03/24/2017  . Chronic pain 03/24/2017  . Diabetic ulcer of lower leg (HCC) 03/24/2017  . Gangrene of toe of right foot (HCC)   . Acute on chronic diastolic CHF (congestive heart failure) (HCC)  02/22/2016  . Fluid overload   . Pericardial effusion   . Ascites 02/15/2016  . Uncontrolled diabetes mellitus with end-stage renal disease (HCC) 02/16/2015  . Bimalleolar ankle fracture 02/12/2015  . CAD (coronary artery disease) 02/09/2015  . PAD (peripheral artery disease) (HCC)   . Gastroparesis   . Chronic anemia   . Menorrhagia with regular cycle 01/04/2014  . Dysmenorrhea 01/04/2014  . Nausea & vomiting 10/16/2011  . History of noncompliance with medical treatment 05/09/2011  . Chronic UTI 05/09/2011  . CIN III (cervical intraepithelial neoplasia grade III) with severe dysplasia   . History of abscesses in groins 12/06/2010  . End stage renal disease on dialysis (HCC)   . Coronary artery disease   . Hyperlipidemia   . 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 NONDEPENDENT TOBACCO USE DISORDER 12/20/2009  . Essential hypertension, benign 12/20/2009  . NEPHROTIC SYNDROME 12/20/2009    Expected Discharge Date: Expected Discharge Date: 07/10/17  Team Members Present: Physician leading conference: Dr. Ankit Patel Social Worker Present: Jenny , LCSW Nurse Present: Jeanna Hicks, RN PT Present: Karen Donaworth, PT OT Present:  Smith, OT SLP Present: Nicole Page, SLP PPS Coordinator present : Marie Noel, RN, CRRN     Current Status/Progress Goal Weekly Team Focus  Medical   Deficits with mobility, transfers, self-care secondary to Right BKA and wounds  Improve wounds,   mobility, transfers, compliance, safety, ascites, osteo  See above   Bowel/Bladder   pt oliguric incontient of stool LBM 07/01/17  less incidents of incontinence continue HD will be continent of bowel with min A free from constipation  toilet pt q2h and prn pt will be free of constipation   Swallow/Nutrition/ Hydration             ADL's   Pt completing slide board transfers with [place]; has been completing bathing/dressing ADLs prior to therapy sessions at bed level; performs lateral leans with ModA   min  A overall   transfers, promoting knee extension; ADL retraining; OOB tolerance; pain management    Mobility   mod A sliding board, unable to stand  min A  activity tolerance, w/c mobility   Communication             Safety/Cognition/ Behavioral Observations  supervision-mod I; pt reports being back at baseline for cognition   mod I   education complete, goals met, pt back to baseline for cognition    Pain   pt started on fentanyl patch 52mg continues with Oxycodone IR and tylenol prn   pt's pain will be <=3/10 with min A  assess pain q4h and prn, assess for effectiveness of analgesics notify MD for pain that is not relieved   Skin   stage 4 ishcial area NS wet-dry dressing, R BKA with DSD, R groin hydrogel with DSD sacral wounds with foam   maintain skin without further breakdown, wounds to show signs of improvement/healing surgical incision free of infection with min A  assess skin qshift and prn continue treatments as ordered     Rehab Goals Patient on target to meet rehab goals: Yes Rehab Goals Revised: none *See Care Plan and progress notes for long and short-term goals.     Barriers to Discharge  Current Status/Progress Possible Resolutions Date Resolved   Physician    Medical stability;Decreased caregiver support;Lack of/limited family support;Hemodialysis;Medication compliance;Other (comments);IV antibiotics  Ascites  See above  Therapies, HD recs per Nephro, Paracentesis as tolerated, follow labs, optimize pain meds, IV abx      Nursing                  PT                    OT                  SLP                SW                Discharge Planning/Teaching Needs:  Pt is hopeful to return to her home where her husband and dtr can provide care, as long as she can reach manageable level of care.    Family coming for education on 07-08-17 to make sure that they can manage her care at home, but they are committed to having her at home.   Team Discussion:  Pt with  chronic pain - Dr. PPosey Prontoincreased her gabapentin.  Pt will need IV antibiotics for several weeks at home.  Dr. PPosey Prontois adjusting her BP and DM medications, as well.  Pt has 8L tapped 07-01-17.  RN discussed pt's stage 4 wound on her bottom.  Also, pt is refusing her heparin, novolog, and lantus.  Pt with left foot ulcer.  Will need specialty bed at home.  PT talking with pt about getting a  specialty chair vs. Prosthetic. Pt is min A bed level with OT.    Revisions to Treatment Plan:  none    Continued Need for Acute Rehabilitation Level of Care: The patient requires daily medical management by a physician with specialized training in physical medicine and rehabilitation for the following conditions: Daily direction of a multidisciplinary physical rehabilitation program to ensure safe treatment while eliciting the highest outcome that is of practical value to the patient.: Yes Daily medical management of patient stability for increased activity during participation in an intensive rehabilitation regime.: Yes Daily analysis of laboratory values and/or radiology reports with any subsequent need for medication adjustment of medical intervention for : Wound care problems;Diabetes problems;Blood pressure problems;Post surgical problems;Renal problems;Other  ,  Capps 07/04/2017, 12:36 AM    

## 2017-07-04 NOTE — Progress Notes (Signed)
Social Work Patient ID: [name], female   DOB: Jul 11, 1975, 42 y.o.   MRN: [id number]   CSW met with pt 07-03-17 to update her on team conference discussion and targeted d/c date of 07-10-17.  She was already aware of date, as therapists shared it with her prior to [facility] visit.  Pt is of course, ready to be home after not being there since late November, but she is leary that her family is fully prepared and able to deal with as much care as she is.  CSW explained that's why it's good to do family education and this has already been scheduled for 07-08-17.  This will show family care needed and make sure all involved (pt and family) feel ready for d/c.    CSW later talked with pt's husband via telephone and he is prepared to come for education and again confirmed that they were taking pt home.  Pt's husband was wondering if two different insurances they have will pay for/share or split cost of power w/c and prosthesis.  CSW explained usually not, it's one or the other, but called [employer], Andria Rhein with specialty wheelchairs and she is going to discuss option for pt with her team and will get back to CSW.  Her knowledge is also, they pay for one or the other, not both.  And, the secondary insurance usually covers what primary covers, not additional items.  CSW to make arrangements next week for DME, HH, IV antibiotics, f/u appt, etc.  CSW remains available as needed.

## 2017-07-04 NOTE — Progress Notes (Signed)
[place] PHYSICAL MEDICINE & REHABILITATION     PROGRESS NOTE  Subjective/Complaints:  Pt seen lying in bed this AM.  She slept well overnight.  Denies complaints.   ROS: Denies CP, SOB, nausea, vomiting, diarrhea.  Objective: Vital Signs: Blood pressure (!) 192/86, pulse 76, temperature 98 F (36.7 C), temperature source Oral, resp. rate 18, height 6\' 4"  (1.93 m), weight 82.5 kg (181 lb 14.1 oz), last menstrual period 10/09/2015, SpO2 95 %. No results found. Recent Labs    07/02/17 1325  WBC 12.3*  HGB 8.1*  HCT 27.3*  PLT 300   Recent Labs    07/02/17 1325  NA 135  K 4.7  CL 95*  GLUCOSE 228*  BUN 51*  CREATININE 5.48*  CALCIUM 7.7*   CBG (last 3)  Recent Labs    07/03/17 1646 07/03/17 2046 07/04/17 0649  GLUCAP 250* 234* 98    Wt Readings from Last 3 Encounters:  07/02/17 82.5 kg (181 lb 14.1 oz)  06/23/17 76.8 kg (169 lb 6.4 oz)  06/11/17 83.5 kg (184 lb 1.3 oz)    Physical Exam:  BP (!) 192/86 (BP Location: Right Arm)   Pulse 76   Temp 98 F (36.7 C) (Oral)   Resp 18   Ht 6\' 4"  (1.93 m)   Wt 82.5 kg (181 lb 14.1 oz)   LMP 10/09/2015 Comment: pt on dialysis  SpO2 95%   BMI 22.14 kg/m  Constitutional: She has a sickly appearance. No distress.  HENT: Normocephalic and atraumatic.  Eyes:  and EOM are normal. No discharge.  Cardiovascular: RRR. No JVD. Respiratory: Effort normal and breath sounds normal.  GI: Bowel sounds are normal. She exhibits distension. Ascites.  Musculoskeletal: She exhibits mild edema.  Neurological: She is alert and oriented.  Motor: 4-/5 B/l UE, LLE, RLE HF  Skin: Skin is warm and dry. She is not diaphoretic. Right groin wound C/D/I, not examined today Ischial Ulcer, not examined today BKA with dried blistering along lateral incision margins Dusky left foot Psychiatric: Her speech is normal. Blunt.  Assessment/Plan: 1. Functional deficits secondary to Right BKA and wounds which require 3+ hours per day of  interdisciplinary therapy in a comprehensive inpatient rehab setting. Physiatrist is providing close team supervision and 24 hour management of active medical problems listed below. Physiatrist and rehab team continue to assess barriers to discharge/monitor patient progress toward functional and medical goals.  Function:  Bathing Bathing position   Position: Bed  Bathing parts Body parts bathed by patient: Right arm, Left arm, Chest, Abdomen Body parts bathed by helper: Front perineal area, Buttocks, Right upper leg, Left upper leg, Right lower leg, Left lower leg  Bathing assist Assist Level: (40%, maximal assist level)      Upper Body Dressing/Undressing Upper body dressing   What is the patient wearing?: Pull over shirt/dress     Pull over shirt/dress - Perfomed by patient: Thread/unthread right sleeve, Thread/unthread left sleeve, Put head through opening, Pull shirt over trunk(pt self reported) Pull over shirt/dress - Perfomed by helper: Pull shirt over trunk        Upper body assist Assist Level: Set up      Lower Body Dressing/Undressing Lower body dressing   What is the patient wearing?: Pants     Pants- Performed by patient: Thread/unthread right pants leg, Thread/unthread left pants leg, Pull pants up/down(Pt self reported) Pants- Performed by helper: Thread/unthread right pants leg, Thread/unthread left pants leg, Pull pants up/down  Socks - Performed by helper: Don/doff right sock, Don/doff left sock   Shoes - Performed by helper: Don/doff right shoe, Don/doff left shoe, Fasten right, Fasten left          Lower body assist Assist for lower body dressing: Set up      Toileting Toileting     Toileting steps completed by helper: Adjust clothing prior to toileting, Performs perineal hygiene, Adjust clothing after toileting    Toileting assist     Transfers Chair/bed transfer   Chair/bed transfer method: Lateral scoot Chair/bed transfer assist  level: Touching or steadying assistance (Pt > 75%) Chair/bed transfer assistive device: Sliding board Mechanical lift: Maximove   Locomotion Ambulation Ambulation activity did not occur: Safety/medical Editor, commissioning activity did not occur: (pt in TIS at this time) Type: Manual Max wheelchair distance: 50 Assist Level: Supervision or verbal cues  Cognition Comprehension Comprehension assist level: Follows complex conversation/direction with no assist  Expression Expression assist level: Expresses complex ideas: With no assist  Social Interaction Social Interaction assist level: Interacts appropriately with others with medication or extra time (anti-anxiety, antidepressant).  Problem Solving Problem solving assist level: Solves complex problems: With extra time  Memory Memory assist level: More than reasonable amount of time    Medical Problem List and Plan: 1.  Deficits with mobility, transfers, self-care secondary to Right BKA and wounds.   Continue CIR   2.  DVT Prophylaxis/Anticoagulation: Pharmaceutical: Lovenox 3. Pain Management:    Fentanyl 25 mcg patch    IV dilaudid transitioned to oxycodone prn.     Gabapentin increased to 100 twice a day on 2/6 4. Mood: Offer Ego support.  5. Neuropsych: This patient is capable of making decisions on her own behalf. 6. Skin/Wound Care: has evidence of malnutrition. Has been refusing nephro as well as protein supplements. Encourage intake to help promote wound healing as well as issues with ascites.    Ischial  Decub: Air mattress overlay for pressure relief measures. Added protein supplement for malnutrition. Dakin's solution with damp to dry dressing. May need plastics consult.            Right groin wound 7. Fluids/Electrolytes/Nutrition: Renal diet with 1200 cc FR. Monitor daily weights.  8. Left Ischium decub with osteo: Daily local care with saline dressing. Need to encourage intake of supplements to  promote wound healing.     Per ID, Vanc/Ceftazidime x6 weeks after discussion   Vanc level within normal limits on 2/6 9. T2DM: Monitor BS ac/hs.    Continue Lantus 20 units in the a.m.   Lantus 14 units at bedtime and increase to 19 units on 2/1, increased further over the weekend, decreased to 20 units on 2/4, increased to 21 units on 2/5   SSI for elevated BS.    CBG (last 3)  Recent Labs    07/03/17 1646 07/03/17 2046 07/04/17 0649  GLUCAP 250* 234* 98    Relatively controlled on 2/8 10. HTN: Monitor BP bid. Continue isosorbide bid, labetalol tid, clonidine tid and norvasc daily.  Vitals:   07/03/17 2100 07/04/17 0440  BP: (!) 185/70 (!) 192/86  Pulse: 69 76  Resp: 18 18  Temp: 98 F (36.7 C) 98 F (36.7 C)  SpO2: 97% 95%     Hydralazine 10 3 times a day started on 1/30, increased to 25 on 1/31, increased to 50 on 2/4, increased to 100 on 2/6   Remains labile and  uncontrolled with hypertensive crisis overnight, however, improved from baseline after discussion with Nephro, remains at risk for end organ damage 11. Pruritis: Question due to rise in phosphorous levels. Sarna tid with benadryl prn.  12. Ascites: Monitor weight daily.  Filed Weights   07/02/17 0214 07/02/17 1349 07/02/17 1749  Weight: 87 kg (191 lb 12.8 oz) 85 kg (187 lb 6.3 oz) 82.5 kg (181 lb 14.1 oz)    Incomplete tap on 1/29, removing 2 L, removed 7.9 L on 2/6, appreciate VIR   Fluid analysis remains pending 2/8 13. ESRD   Recs per Nephro- appreciate Nephro note 14. Acute on chronic anemia  Hb 8.1 on 2/6   Cont to monitor 15. Leukocytosis   WBCs 12.3 on 2/6   Afebrile  Cont to monitor   LOS (Days) 11 A FACE TO FACE EVALUATION WAS PERFORMED  Charlise Giovanetti Lorie Phenix 07/04/2017 10:01 AM

## 2017-07-04 NOTE — Progress Notes (Addendum)
Physical Therapy Session Note  Patient Details  Name: [name] MRN: [id number] Date of Birth: [birth year]  Today's Date: 07/04/2017 PT Individual Time: 1100-1200 PT Individual Time Calculation (min): 60 min   Short Term Goals: Week 2:  PT Short Term Goal 1 (Week 2): =LTG  Skilled Therapeutic Interventions/Progress Updates:    Focused on education on w/c seating evaluation scheduled for Monday including process and issued home measurement sheet to have husband fill out over weekend to determine accessibility. Pt still in agreement to pursue power seating at this time. Obtained our practice power w/c for trial for rest of length of stay and educated on features for pressure relief and positioning of seat and legrests, power button and speed controls. Focused on w/c mobility training through obstacles for turning negotiation, navigating doorways, and ascending/descending ramp for community access (educated on need to raise up leg rests over uneven surfaces and recommendation to put w/c in tilted position when descending a hill for safety). Pt able to return demo all skills safely with cues initially for recall of how buttons work. Instructed in seated BLE LAQ for ROM and strengthening x 10 reps each for 5 second hold and then hip abduction squeezes with ball x 10 reps each with 5 second hold. Reviewed family education schedule for next week as well.   Addendum: Pt performed supervision to min assist slideboard transfer from TIS w/c -> bed -> power w/c with focus on placement of slideboard and technique. Pt with good carryover noted. Will benefit from continued practice to focus on clearance of bottom.   Therapy Documentation Precautions:  Precautions Precautions: Fall Precaution Comments: sacral decubital wound; R BKA (utilize KI for extension) Restrictions Weight Bearing Restrictions: Yes RLE Weight Bearing: Non weight bearing   Pain: Premedicated for pain in residual limb.   See  Function Navigator for Current Functional Status.   Therapy/Group: Individual Therapy  Canary Brim Ivory Broad, PT, DPT  07/04/2017, 12:13 PM

## 2017-07-05 ENCOUNTER — Inpatient Hospital Stay (HOSPITAL_COMMUNITY): Payer: Medicare Other | Admitting: Occupational Therapy

## 2017-07-05 ENCOUNTER — Inpatient Hospital Stay (HOSPITAL_COMMUNITY): Payer: Medicare Other | Admitting: Physical Therapy

## 2017-07-05 LAB — GLUCOSE, CAPILLARY
GLUCOSE-CAPILLARY: 167 mg/dL — AB (ref 65–99)
Glucose-Capillary: 145 mg/dL — ABNORMAL HIGH (ref 65–99)
Glucose-Capillary: 211 mg/dL — ABNORMAL HIGH (ref 65–99)
Glucose-Capillary: 151 mg/dL — ABNORMAL HIGH (ref 65–99)

## 2017-07-05 NOTE — Progress Notes (Signed)
Pharmacy Antibiotic Note  [name] is a 42 y.o. female with recent complicated hospital course 03/2017 discharged to SNF 1/16, but readmitted on the same day after fall out of bed and had possible small hemorrhagic mass on CT. She was being treated with vanc/zosyn for sacral osteo (1/17-1/23). And s/p RBKA on 1/22, and transferred to CIR on 1/28.   Pharmacy is consulted to dose vancomycin/ceftazidime per Dr. Johnnye Sima recommendation. Plan to treat for 6 weeks. She is afebrile, wbc 12.5 (stable), current weight 77 kg (some variations on weight 70-80kg). HD MWF on schedule, next HD 2/11. Has been tolerating 4 hr sessions at BFR 400. Last vancomycin level within range (17) on 2/6.    Plan: Goal Pre-HD level 15-25 mcg/mL Continue Vancomycin 750 mg IV Q HD --Vancomycin level once weekly or as indicated Continue Ceftazidime 1g IV Q 24 hrs F/u HD schedule, disposition plan (possible discharge late next week?) --will need OPAT for 6 weeks of antibiotics at discharge  Height: 6\' 4"  (193 cm) Weight: 169 lb 12.1 oz (77 kg) IBW/kg (Calculated) : 82.3  Temp (24hrs), Avg:98 F (36.7 C), Min:97.4 F (36.3 C), Max:98.8 F (37.1 C)  Recent Labs  Lab 06/30/17 1516 06/30/17 1517 07/02/17 0701 07/02/17 1325 07/04/17 1331  WBC 12.9*  --   --  12.3* 12.5*  CREATININE  --  5.92*  --  5.48* 4.97*  VANCORANDOM  --   --  17  --   --     Estimated Creatinine Clearance: 18.1 mL/min (A) (by C-G formula based on SCr of 4.97 mg/dL (H)).    Allergies  Allergen Reactions  . Cephalexin Other (See Comments)    Reaction unknown- Childhood allergy Tolerated Ceftriaxone in the past  . Sulfamethoxazole-Trimethoprim Other (See Comments)    Unknown reaction. Pt states that she was told by her mother that she had allergy to Bactrim as a child.    Antimicrobials this admission: Tressie Ellis 1/29 >> Vancomycin 1/17>>1/23, 1/29 >> Zosyn 1/17>>1/23  Microbiology results: 1/17 BCx: neg 1/17 Peritoneal fluid:  neg 1/17 MRSA PCR: neg 1/21 surgical PCR: MRSA neg, s.aureus pos 1/29 peritoneal fluid - neg 2/5 peritoneal fluid -ng x 2 days 2/5 fungal cx - ngtd   Thank you for allowing [place] to participate in this patients care.  Jens Som, PharmD Clinical phone for 07/05/2017 from 7a-3:30p: [phone] If after 3:30p, please call main pharmacy at: [phone] 07/05/2017 10:21 AM

## 2017-07-05 NOTE — Progress Notes (Signed)
[place] PHYSICAL MEDICINE & REHABILITATION     PROGRESS NOTE  Subjective/Complaints:  Patient seen lying in bed this morning. She states she slept well overnight.  ROS: Denies CP, SOB, nausea, vomiting, diarrhea.  Objective: Vital Signs: Blood pressure (!) 177/70, pulse 77, temperature 98.8 F (37.1 C), temperature source Oral, resp. rate 18, height 6\' 4"  (1.93 m), weight 77 kg (169 lb 12.1 oz), last menstrual period 10/09/2015, SpO2 99 %. No results found. Recent Labs    07/02/17 1325 07/04/17 1331  WBC 12.3* 12.5*  HGB 8.1* 8.5*  HCT 27.3* 28.0*  PLT 300 262   Recent Labs    07/02/17 1325 07/04/17 1331  NA 135 135  K 4.7 4.8  CL 95* 94*  GLUCOSE 228* 96  BUN 51* 44*  CREATININE 5.48* 4.97*  CALCIUM 7.7* 7.9*   CBG (last 3)  Recent Labs    07/04/17 2113 07/05/17 0656 07/05/17 1122  GLUCAP 188* 167* 145*    Wt Readings from Last 3 Encounters:  07/04/17 77 kg (169 lb 12.1 oz)  06/23/17 76.8 kg (169 lb 6.4 oz)  06/11/17 83.5 kg (184 lb 1.3 oz)    Physical Exam:  BP (!) 177/70 (BP Location: Right Arm)   Pulse 77   Temp 98.8 F (37.1 C) (Oral)   Resp 18   Ht 6\' 4"  (1.93 m)   Wt 77 kg (169 lb 12.1 oz)   LMP 10/09/2015 Comment: pt on dialysis  SpO2 99%   BMI 20.66 kg/m  Constitutional: She has a sickly appearance. No distress.  HENT: Normocephalic and atraumatic.  Eyes:  and EOM are normal. No discharge.  Cardiovascular: RRR. No JVD. Respiratory: Effort normal and breath sounds normal.  GI: Bowel sounds are normal. She exhibits distension. Ascites.  Musculoskeletal: She exhibits mild edema.  Neurological: She is alert and oriented.  Motor: 4-/5 B/l UE, LLE, RLE HF (stable)  Skin: Skin is warm and dry. She is not diaphoretic. Right groin wound C/D/I, not examined today Ischial Ulcer, not examined today BKA with sanguinous drainage along incision line Dusky left foot Psychiatric: Her speech is normal. Blunt.  Assessment/Plan: 1. Functional  deficits secondary to Right BKA and wounds which require 3+ hours per day of interdisciplinary therapy in a comprehensive inpatient rehab setting. Physiatrist is providing close team supervision and 24 hour management of active medical problems listed below. Physiatrist and rehab team continue to assess barriers to discharge/monitor patient progress toward functional and medical goals.  Function:  Bathing Bathing position   Position: Bed  Bathing parts Body parts bathed by patient: Right arm, Left arm, Chest, Abdomen Body parts bathed by helper: Front perineal area, Buttocks, Right upper leg, Left upper leg, Right lower leg, Left lower leg  Bathing assist Assist Level: (40%, maximal assist level)      Upper Body Dressing/Undressing Upper body dressing   What is the patient wearing?: Pull over shirt/dress     Pull over shirt/dress - Perfomed by patient: Thread/unthread right sleeve, Thread/unthread left sleeve, Put head through opening, Pull shirt over trunk(pt self reported) Pull over shirt/dress - Perfomed by helper: Pull shirt over trunk        Upper body assist Assist Level: Set up      Lower Body Dressing/Undressing Lower body dressing   What is the patient wearing?: Pants     Pants- Performed by patient: Thread/unthread right pants leg, Thread/unthread left pants leg, Pull pants up/down(Pt self reported) Pants- Performed by helper: Thread/unthread right pants leg,  Thread/unthread left pants leg, Pull pants up/down       Socks - Performed by helper: Don/doff right sock, Don/doff left sock   Shoes - Performed by helper: Don/doff right shoe, Don/doff left shoe, Fasten right, Fasten left          Lower body assist Assist for lower body dressing: Set up      Toileting Toileting     Toileting steps completed by helper: Adjust clothing prior to toileting, Performs perineal hygiene, Adjust clothing after toileting    Toileting assist     Transfers Chair/bed  transfer   Chair/bed transfer method: Lateral scoot Chair/bed transfer assist level: Touching or steadying assistance (Pt > 75%) Chair/bed transfer assistive device: Armrests, Sliding board Mechanical lift: Maximove   Locomotion Ambulation Ambulation activity did not occur: Safety/medical Editor, commissioning activity did not occur: (pt in TIS at this time) Type: Motorized Max wheelchair distance: 200' Assist Level: Supervision or verbal cues  Cognition Comprehension Comprehension assist level: Follows complex conversation/direction with no assist  Expression Expression assist level: Expresses complex ideas: With no assist  Social Interaction Social Interaction assist level: Interacts appropriately with others with medication or extra time (anti-anxiety, antidepressant).  Problem Solving Problem solving assist level: Solves complex problems: With extra time  Memory Memory assist level: More than reasonable amount of time    Medical Problem List and Plan: 1.  Deficits with mobility, transfers, self-care secondary to Right BKA and wounds.   Continue CIR   2.  DVT Prophylaxis/Anticoagulation: Pharmaceutical: Lovenox 3. Pain Management:    Fentanyl 25 mcg patch    IV dilaudid transitioned to oxycodone prn.     Gabapentin increased to 100 twice a day on 2/6 4. Mood: Offer Ego support.  5. Neuropsych: This patient is capable of making decisions on her own behalf. 6. Skin/Wound Care: has evidence of malnutrition. Has been refusing nephro as well as protein supplements. Encourage intake to help promote wound healing as well as issues with ascites.    Ischial  Decub: Air mattress overlay for pressure relief measures. Added protein supplement for malnutrition. Dakin's solution with damp to dry dressing. May need plastics consult.            Right groin wound 7. Fluids/Electrolytes/Nutrition: Renal diet with 1200 cc FR. Monitor daily weights.  8. Left Ischium decub with  osteo: Daily local care with saline dressing. Need to encourage intake of supplements to promote wound healing.     Per ID, Vanc/Ceftazidime x6 weeks after discussion   Vanc level within normal limits on 2/6 9. T2DM: Monitor BS ac/hs.    Continue Lantus 20 units in the a.m.   Lantus 14 units at bedtime and increase to 19 units on 2/1, increased further over the weekend, decreased to 20 units on 2/4, increased to 21 units on 2/5   SSI for elevated BS.    CBG (last 3)  Recent Labs    07/04/17 2113 07/05/17 0656 07/05/17 1122  GLUCAP 188* 167* 145*    Relatively controlled on 2/9 10. HTN: Monitor BP bid. Continue isosorbide bid, labetalol tid, clonidine tid and norvasc daily.  Vitals:   07/04/17 2201 07/05/17 0540  BP: (!) 163/69 (!) 177/70  Pulse: 78 77  Resp: 18 18  Temp: 98.1 F (36.7 C) 98.8 F (37.1 C)  SpO2: 98% 99%     Hydralazine 10 3 times a day started on 1/30, increased to 25 on 1/31, increased  to 50 on 2/4, increased to 100 on 2/6   Remains uncontrolled with hypertensive crisis overnight, however, improved from baseline after discussion with Nephro, remains at risk for further end organ damage 11. Pruritis: Question due to rise in phosphorous levels. Sarna tid with benadryl prn.  12. Ascites: Monitor weight daily.  Filed Weights   07/02/17 1349 07/02/17 1749 07/04/17 1843  Weight: 85 kg (187 lb 6.3 oz) 82.5 kg (181 lb 14.1 oz) 77 kg (169 lb 12.1 oz)    Incomplete tap on 1/29, removing 2 L, removed 7.9 L on 2/6, appreciate VIR   Fluid analysis remains pending 2/9 13. ESRD   Recs per Nephro- appreciate Nephro note 14. Acute on chronic anemia  Hb 8.5 on 2/8   Cont to monitor 15. Leukocytosis   WBCs 12.5 on 2/8   Afebrile  Cont to monitor   LOS (Days) 12 A FACE TO FACE EVALUATION WAS PERFORMED  Ankit Lorie Phenix 07/05/2017 11:59 AM

## 2017-07-05 NOTE — Progress Notes (Addendum)
Physical Therapy Session Note  Patient Details  Name: [name] MRN: [id number] Date of Birth: 1975-08-14  Today's Date: 07/05/2017 PT Individual Time: 0349-1791 PT Individual Time Calculation (min): 41 min   Short Term Goals: Week 2:  PT Short Term Goal 1 (Week 2): =LTG  Skilled Therapeutic Interventions/Progress Updates:  Pt received in bed & agreeable to tx. Pt completes supine>sitting EOB with min assist as pt unable to push with UE to come to full upright position. Pt completes bed>power w/c transfer with slide board and min assist (therapist provides total assist for slide board set up). Pt maneuvered power w/c throughout unit with supervision. Discussed possibility of pt purchasing vehicle lift to transfer w/c out in the community & pt reports she would like to look into this. Pt utilized upper extremity ergometer on level 2 x 3 minutes forwards + 2 minutes backwards for cardiopulmonary endurance training; pt reported fatigue with task. At end of session pt left sitting in power w/c in room with all needs within reach.  Pt reported she does not tilt chair for pressure relief 2/2 pain on wound - educated pt on importance of tilting every hour or 2 for pressure relief on buttocks.   Therapy Documentation Precautions:  Precautions Precautions: Fall Precaution Comments: wound vac from groin, sacral decubital wound  Restrictions Weight Bearing Restrictions: Yes RLE Weight Bearing: Non weight bearing  Pain: Denies c/o pain.   See Function Navigator for Current Functional Status.   Therapy/Group: Individual Therapy  Waunita Schooner 07/05/2017, 3:00 PM

## 2017-07-05 NOTE — Progress Notes (Signed)
Occupational Therapy Session Note  Patient Details  Name: [name] MRN: [id number] Date of Birth: March 08, 1976  Today's Date: 07/05/2017 OT Individual Time: 6294-7654 OT Individual Time Calculation (min): 60 min     Skilled Therapeutic Interventions/Progress Updates: patient c/o of 8/10 pain but willing to participate.   This clinician attempted to help stretch trigger point area in right periformis area and applied a little pressure and ice to area patient described as her right bursa area (sounded from patient description as inflamed and irritated).   This clinician also applied pressure to attempt to decrease spasms in right quadricept, hamstring, and along IT band.  Otherwise, patient transferred to her power w/c via sliding board with good technique for board placement and demosntrated knowledge for repositionig chair to allow for pressure relief and such.  Patient was left seated upright in her w/c (stated she did not want her belt applied) speaking with a friend who came to visit.     Therapy Documentation Precautions:  Precautions Precautions: Fall Precaution Comments: wound vac from groin, sacral decubital wound  Restrictions Weight Bearing Restrictions: Yes RLE Weight Bearing: Non weight bearing  Pain:8/10 "right hip all the way down my leg" and "in that wound"    RN gave pain meds.    Therapy/Group: Individual Therapy  Alfredia Ferguson [facility] 07/05/2017, 3:36 PM

## 2017-07-05 NOTE — Progress Notes (Addendum)
Occupational Therapy Session Note  Patient Details  Name: [name] MRN: [id number] Date of Birth: 1975/08/21  Today's Date: 07/05/2017 OT Individual Time: 4854-6270 OT Individual Time Calculation (min): 45 min    Skilled Therapeutic Interventions/Progress Updates:    Pt seen for makeup therapy time and agreeable to tx. She self propelled in [place] down to [employer], [employer] boy [employer] (acting as a makeshift therapy cone) to increase proficiency with w/c use. Once at destination, practiced pressure relief techniques using PWC controls (with min vcs) for posterior weight shifting. Also completed lateral weight shifts for 20 second holds, and mini w/c push ups for 5 second holds. Utilized teach back method for carryover of pressure relief techniques outside of therapies. Discussed importance of pressure relief schedule for skin integrity and strengthening purposes with pt reporting she will continue these exercises throughout the day in room. Afterwards pt self propelled back to room in manner as written above. Pt left with all needs and RN present at session exit.   Pt also reported that she used the [facility] yesterday with nursing staff!  Therapy Documentation Precautions:  Precautions Precautions: Fall Precaution Comments: wound vac from groin, sacral decubital wound  Restrictions Weight Bearing Restrictions: Yes RLE Weight Bearing: Non weight bearing General: General OT Amount of Missed Time: (20) Vital Signs: Therapy Vitals Temp: 97.6 F (36.4 C) Temp Source: Oral Pulse Rate: 78 Resp: 19 BP: (!) 183/78(RN notified) Patient Position (if appropriate): Lying Oxygen Therapy SpO2: 96 % O2 Device: Not Delivered ADL: ADL ADL Comments: see functional naviagtor :    See Function Navigator for Current Functional Status.   Therapy/Group: Individual Therapy  Kona Lover A Lupe Handley 07/05/2017, 5:11 PM

## 2017-07-05 NOTE — Progress Notes (Signed)
Occupational Therapy Session Note  Patient Details  Name: [name] MRN: [id number] Date of Birth: 1975/09/19  Today's Date: 07/05/2017 OT Individual Time: 1310-1335 OT Individual Time Calculation (min): 25 min     Skilled Therapeutic Interventions/Progress Updates: this session patient very fatigue and c/o pain after wound and residual limb care.     Session was limited to side lying UE ROM in order to let pain and irritation from aforementioned nursing care to subside.    Patient scheduled for another session following this one.  Call bell and cell phone within patient reach.     Therapy Documentation Precautions:  Precautions Precautions: Fall Precaution Comments: wound vac from groin, sacral decubital wound  Restrictions Weight Bearing Restrictions: Yes RLE Weight Bearing: Non weight bearing General: General OT Amount of Missed Time: (20)  Pain:8/10 from wound care and spasms and other pain.    RN gave pain meds    Therapy/Group: Individual Therapy  Alfredia Ferguson [facility] 07/05/2017, 3:54 PM

## 2017-07-06 ENCOUNTER — Inpatient Hospital Stay (HOSPITAL_COMMUNITY): Payer: Medicare Other | Admitting: Occupational Therapy

## 2017-07-06 ENCOUNTER — Inpatient Hospital Stay (HOSPITAL_COMMUNITY): Payer: Medicare Other

## 2017-07-06 LAB — GLUCOSE, CAPILLARY
GLUCOSE-CAPILLARY: 84 mg/dL (ref 65–99)
GLUCOSE-CAPILLARY: 118 mg/dL — AB (ref 65–99)
GLUCOSE-CAPILLARY: 218 mg/dL — AB (ref 65–99)
Glucose-Capillary: 165 mg/dL — ABNORMAL HIGH (ref 65–99)

## 2017-07-06 NOTE — Progress Notes (Signed)
[facility] Progress Note  Subjective: no c/o, lying in bed  Vitals:   07/05/17 1215 07/05/17 1333 07/05/17 2147 07/06/17 0436  BP: (!) 162/75 (!) 183/78 (!) 167/67 (!) 179/77  Pulse: 76 78  71  Resp: 20 19  18   Temp: 97.7 F (36.5 C) 97.6 F (36.4 C)  98.1 F (36.7 C)  TempSrc: Oral Oral  Oral  SpO2: 100% 96%  100%  Weight:    81 kg (178 lb 9.2 oz)  Height:        Inpatient medications: . amLODipine  10 mg Oral QHS  . atorvastatin  40 mg Oral q1800  . cinacalcet  60 mg Oral Q supper  . cloNIDine  0.3 mg Oral TID  . darbepoetin (ARANESP) injection - DIALYSIS  200 mcg Intravenous Q Fri-HD  . docusate sodium  100 mg Oral TID  . fentaNYL  25 mcg Transdermal Q72H  . ferric citrate  420 mg Oral TID WC  . gabapentin  100 mg Oral BID  . heparin  5,000 Units Subcutaneous Q8H  . hydrALAZINE  100 mg Oral Q8H  . insulin aspart  0-9 Units Subcutaneous TID WC  . insulin glargine  20 Units Subcutaneous Daily  . insulin glargine  20 Units Subcutaneous QHS  . isosorbide dinitrate  60 mg Oral BID  . labetalol  300 mg Oral TID  . metoCLOPramide  2.5 mg Oral TID AC  . multivitamin  1 tablet Oral QHS  . pantoprazole  40 mg Oral Daily  . protein supplement  1 scoop Oral TID WC  . sertraline  50 mg Oral Daily  . sevelamer carbonate  2,400 mg Oral TID WC   . cefTAZidime (FORTAZ)  IV Stopped (07/05/17 1633)  . vancomycin 750 mg (07/04/17 1716)   acetaminophen, aluminum hydroxide, bisacodyl, diphenhydrAMINE, guaiFENesin-dextromethorphan, LORazepam, oxyCODONE, phenol, polyethylene glycol, prochlorperazine **OR** prochlorperazine **OR** prochlorperazine, traMADol, traZODone  Exam: awake and alert Regular S1S2 No S3 Lungs clear Abd tight but not taut (ascites) + BS. Not tender R groin wound not examined R BKA stump with 1+ edema shrinker on and in immobilizer No LLE edema Sacral wounds covered not examined Muscle wasting throughout Dialysis Access: left AVF +  bruit   Dialysis: MWF [facility] 4h 11min  85.5kg 2/2.25  L AVF  No heparin for now due to small IC bleed by CT 1/16 (was on Hep 2000)  -mircera 200 every 2 wks      Impression: 1  Sacrak decub 2  BKA 3  R groin wound 4  ESRD HD mwf, avg UF 2-3kg here 5  Fall / left parietal bleed, small 6  Ascites - periodic paracentesis (7.9 L para last wk 2/5) 7  Anemia ckd, darbe 200/ wk 8  HTN chron/ severe, normal SBP is 160 - 200, cont mult BP meds, avoid SBP < 140 w HD if possible 9  Volume wt's down after tap last wk, below dry 10  CAD 11  DM2   Plan - HD Monday, UF 2-3 L as Ronnie Derby MD [facility] pager [phone]   07/06/2017, 11:01 AM   Recent Labs  Lab 06/30/17 1517 07/02/17 1325 07/04/17 1331  NA 133* 135 135  K 5.2* 4.7 4.8  CL 96* 95* 94*  CO2 24 24 26   GLUCOSE 136* 228* 96  BUN 55* 51* 44*  CREATININE 5.92* 5.48* 4.97*  CALCIUM 7.6* 7.7* 7.9*  PHOS 3.6 3.6 4.4   Recent Labs  Lab  06/30/17 1517 07/02/17 1325 07/04/17 1331  ALBUMIN 1.7* 1.6* 1.7*   Recent Labs  Lab 06/30/17 1516 07/02/17 1325 07/04/17 1331  WBC 12.9* 12.3* 12.5*  HGB 8.4* 8.1* 8.5*  HCT 28.1* 27.3* 28.0*  MCV 90.4 91.6 91.5  PLT 315 300 262   Iron/TIBC/Ferritin/ %Sat    Component Value Date/Time   IRON 26 (L) 06/23/2017 0708   TIBC 113 (L) 06/23/2017 0708   FERRITIN 1,050 (H) 06/23/2017 0708   IRONPCTSAT 23 06/23/2017 0708

## 2017-07-06 NOTE — Progress Notes (Signed)
Physical Therapy Session Note  Patient Details  Name: [name] MRN: [id number] Date of Birth: 1975-12-30  Today's Date: 07/06/2017 PT Individual Time: 1300-1358 PT Individual Time Calculation (min): 58 min   Short Term Goals: Week 2:  PT Short Term Goal 1 (Week 2): =LTG  Skilled Therapeutic Interventions/Progress Updates:    Pt sidelying in bed upon PT arrival, agreeable to therapy tx and reports 7/10 pain in R LE. Pt transferred from sidelying>sitting EOB with supervision and use of bedrails. Pt transferred from bed>power chair using slideboard and min assist, verbal cues for techniques. Pt performed power w/c mobility from room<>gym x 150 ft with supervision, occasional verbal cues for navigation/set up for transfers.  Pt attempted to use standing frame in order to work on transfer, pt unable to tolerate due to strap positioning on her wound. Pt transferred from power chair to nustep with slideboard and min assist, used nustep on workload 4 using L LE and B UEs for global strengthening and endurance x 5 minutes, pt requiring rest breaks throughout secondary to fatigue. Pt practiced transfer from w/c<>rehab apartment bed, mod assist for unlevel transfer. Pt performed seated therex 2 x 10 hamstring curls and LAQ B LEs with resistance from theraband. Pt performed power mobility back to room and left seated in w/c with needs in reach.   Therapy Documentation Precautions:  Precautions Precautions: Fall Precaution Comments: wound vac from groin, sacral decubital wound  Restrictions Weight Bearing Restrictions: Yes RLE Weight Bearing: Non weight bearing   See Function Navigator for Current Functional Status.   Therapy/Group: Individual Therapy  Netta Corrigan, PT, DPT 07/06/2017, 7:50 AM

## 2017-07-06 NOTE — Progress Notes (Signed)
Occupational Therapy Session Note  Patient Details  Name: [name] MRN: [id number] Date of Birth: Jul 16, 1975  Today's Date: 07/06/2017 OT Individual Time: 9678-9381 OT Individual Time Calculation (min): 28 min   Short Term Goals: Week 2:  OT Short Term Goal 1 (Week 2): Pt will tolerate sitting on a padded BSC for 5- 10 min. OT Short Term Goal 2 (Week 2): Pt will be able to use lateral leans to pull pants up/down hips from sitting on a BSC. OT Short Term Goal 3 (Week 2): Pt will be able to tolerate standing in the [place] lift for 2 min to develop standing tolerance.      Skilled Therapeutic Interventions/Progress Updates:    Pt greeted in [place], hooked up to IV. Agreeable to tx and no c/o pain. Worked on UB strengthening with 3 and 4# bars, 10-20 reps 1 set each exercise. Exercises included chest presses, overhead presses, bicep curls, forward/backward rows, and straight arm raises. Also completed UB stretches before and during exercises with instruction on technique, pacing, and deep breathing. At end of tx pt was left with all needs within reach in [facility].   Therapy Documentation Precautions:  Precautions Precautions: Fall Precaution Comments: wound vac from groin, sacral decubital wound  Restrictions Weight Bearing Restrictions: Yes RLE Weight Bearing: Non weight bearing   ADL: ADL ADL Comments: see functional naviagtor     See Function Navigator for Current Functional Status.   Therapy/Group: Individual Therapy  Lutricia Widjaja A Dorea Duff 07/06/2017, 5:42 PM

## 2017-07-06 NOTE — Progress Notes (Signed)
Physical Therapy Session Note (& Make up session)  Patient Details  Name: [name] MRN: [id number] Date of Birth: [birth year]  Today's Date: 07/06/2017 PT Individual Time: 0910-1025 PT Individual Time Calculation (min): 75 min   Short Term Goals: Week 2:  PT Short Term Goal 1 (Week 2): =LTG  Skilled Therapeutic Interventions/Progress Updates:    education and information provided for websites for 2 local vendors who aid with w/c lifts or w/c accessible vans. Discussed public transportation or private hire transportation options as well until decision made about w/c accessible [name] for community access and/or for HD transportation via power w/c. Pt able to perform supine -> sit with bed rails supervision but from flat mat surface required up to mod assist for trunk facilitation. Pt performed functional transfer OOB with slideboard with mod assist due to set up as uneven surface and required lifting assist to clear bottom. Level surface transfers w/c <> mat with supervision including placement of slideboard with cues for increasing use of LLE to aid with transfer to improve clearance and technique. Sidelying RLE therex for functional strengthening and ROM x 15 reps x 2 sets of hip abduction and hip extension. Seated LAQ (1.5# ankle weight on LLE) x 15 reps x 2 sets each and hip adduction squeezes with beach ball with 15 reps with 5 sec hold x 2 sets each. Seat core strengthening with 2.2# weighted medicine ball x 15 reps each x 2 sets of trunk rotation and PNF diagonals both directions. Re-set air in [facility] smart check device for proper pressure relief (sensor #2) and educated pt on purpose of this especially if using ROHO upon d/c. Pt verbalized understanding.   15 min of make-up session  Therapy Documentation Precautions:  Precautions Precautions: Fall Precaution Comments: wound vac from groin, sacral decubital wound  Restrictions Weight Bearing Restrictions: Yes RLE Weight Bearing: Non  weight bearing   Pain:  Pain in residual limb - pt plan to notify RN at end of session per pt request due to pain medication making her sleepy.    See Function Navigator for Current Functional Status.   Therapy/Group: Individual Therapy  Canary Brim Ivory Broad, PT, DPT  07/06/2017, 10:34 AM

## 2017-07-06 NOTE — Progress Notes (Signed)
[facility] PHYSICAL MEDICINE & REHABILITATION     PROGRESS NOTE  Subjective/Complaints:  Pt seen lying in bed this AM.  She slept well overnight.  She does not have her KI in place.  Pt states she just took it off, but wears it otherwise.    ROS: Denies CP, SOB, nausea, vomiting, diarrhea.  Objective: Vital Signs: Blood pressure (!) 179/77, pulse 71, temperature 98.1 F (36.7 C), temperature source Oral, resp. rate 18, height 6\' 4"  (1.93 m), weight 81 kg (178 lb 9.2 oz), last menstrual period 10/09/2015, SpO2 100 %. No results found. Recent Labs    07/04/17 1331  WBC 12.5*  HGB 8.5*  HCT 28.0*  PLT 262   Recent Labs    07/04/17 1331  NA 135  K 4.8  CL 94*  GLUCOSE 96  BUN 44*  CREATININE 4.97*  CALCIUM 7.9*   CBG (last 3)  Recent Labs    07/05/17 2051 07/06/17 0640 07/06/17 1158  GLUCAP 151* 84 118*    Wt Readings from Last 3 Encounters:  07/06/17 81 kg (178 lb 9.2 oz)  06/23/17 76.8 kg (169 lb 6.4 oz)  06/11/17 83.5 kg (184 lb 1.3 oz)    Physical Exam:  BP (!) 179/77 (BP Location: Right Arm)   Pulse 71   Temp 98.1 F (36.7 C) (Oral)   Resp 18   Ht 6\' 4"  (1.93 m)   Wt 81 kg (178 lb 9.2 oz)   LMP 10/09/2015 Comment: pt on dialysis  SpO2 100%   BMI 21.74 kg/m  Constitutional: She has a sickly appearance. No distress.  HENT: Normocephalic and atraumatic.  Eyes:  and EOM are normal. No discharge.  Cardiovascular: RRR. No JVD. Respiratory: Effort normal and breath sounds normal.  GI: Bowel sounds are normal. She exhibits distension. Ascites.  Musculoskeletal: She exhibits mild edema.  Neurological: She is alert and oriented.  Motor: 4-/5 B/l UE, LLE, RLE HF (unchanged)  Skin: Skin is warm and dry. She is not diaphoretic. Right groin wound C/D/I, not examined today Ischial Ulcer, not examined today BKA with dressing c/d/i Dusky left foot Psychiatric: Her speech is normal. Blunt.  Assessment/Plan: 1. Functional deficits secondary to Right BKA and  wounds which require 3+ hours per day of interdisciplinary therapy in a comprehensive inpatient rehab setting. Physiatrist is providing close team supervision and 24 hour management of active medical problems listed below. Physiatrist and rehab team continue to assess barriers to discharge/monitor patient progress toward functional and medical goals.  Function:  Bathing Bathing position   Position: Bed  Bathing parts Body parts bathed by patient: Right arm, Left arm, Chest, Abdomen Body parts bathed by helper: Front perineal area, Buttocks, Right upper leg, Left upper leg, Right lower leg, Left lower leg  Bathing assist Assist Level: (40%, maximal assist level)      Upper Body Dressing/Undressing Upper body dressing   What is the patient wearing?: Pull over shirt/dress     Pull over shirt/dress - Perfomed by patient: Thread/unthread right sleeve, Thread/unthread left sleeve, Put head through opening, Pull shirt over trunk(pt self reported) Pull over shirt/dress - Perfomed by helper: Pull shirt over trunk        Upper body assist Assist Level: Set up      Lower Body Dressing/Undressing Lower body dressing   What is the patient wearing?: Pants     Pants- Performed by patient: Thread/unthread right pants leg, Thread/unthread left pants leg, Pull pants up/down(Pt self reported) Pants- Performed by helper:  Thread/unthread right pants leg, Thread/unthread left pants leg, Pull pants up/down       Socks - Performed by helper: Don/doff right sock, Don/doff left sock   Shoes - Performed by helper: Don/doff right shoe, Don/doff left shoe, Fasten right, Fasten left          Lower body assist Assist for lower body dressing: Set up      Toileting Toileting     Toileting steps completed by helper: Adjust clothing prior to toileting, Performs perineal hygiene, Adjust clothing after toileting    Toileting assist     Transfers Chair/bed transfer   Chair/bed transfer method:  Lateral scoot Chair/bed transfer assist level: Moderate assist (Pt 50 - 74%/lift or lower)(uneven surface; supervision level surface) Chair/bed transfer assistive device: Armrests, Walker Mechanical lift: Maximove   Locomotion Ambulation Ambulation activity did not occur: Safety/medical Editor, commissioning activity did not occur: (pt in TIS at this time) Type: Motorized Max wheelchair distance: 150' Assist Level: No help, No cues, assistive device, takes more than reasonable amount of time  Cognition Comprehension Comprehension assist level: Follows complex conversation/direction with extra time/assistive device  Expression Expression assist level: Expresses complex ideas: With extra time/assistive device  Social Interaction Social Interaction assist level: Interacts appropriately with others with medication or extra time (anti-anxiety, antidepressant).  Problem Solving Problem solving assist level: Solves complex problems: With extra time  Memory Memory assist level: More than reasonable amount of time    Medical Problem List and Plan: 1.  Deficits with mobility, transfers, self-care secondary to Right BKA and wounds.   Continue CIR   2.  DVT Prophylaxis/Anticoagulation: Pharmaceutical: Lovenox 3. Pain Management:    Fentanyl 25 mcg patch    IV dilaudid transitioned to oxycodone prn.     Gabapentin increased to 100 twice a day on 2/6 4. Mood: Offer Ego support.  5. Neuropsych: This patient is capable of making decisions on her own behalf. 6. Skin/Wound Care: has evidence of malnutrition. Has been refusing nephro as well as protein supplements. Encourage intake to help promote wound healing as well as issues with ascites.    Ischial  Decub: Air mattress overlay for pressure relief measures. Added protein supplement for malnutrition. Dakin's solution with damp to dry dressing. May need plastics consult.            Right groin wound 7.  Fluids/Electrolytes/Nutrition: Renal diet with 1200 cc FR. Monitor daily weights.  8. Left Ischium decub with osteo: Daily local care with saline dressing. Need to encourage intake of supplements to promote wound healing.     Per ID, Vanc/Ceftazidime x6 weeks after discussion   Vanc level within normal limits on 2/6 9. T2DM: Monitor BS ac/hs.    Continue Lantus 20 units in the a.m.   Lantus 14 units at bedtime and increase to 19 units on 2/1, increased further over the weekend, decreased to 20 units on 2/4, increased to 21 units on 2/5   SSI for elevated BS.    CBG (last 3)  Recent Labs    07/05/17 2051 07/06/17 0640 07/06/17 1158  GLUCAP 151* 84 118*    Relatively controlled on 2/10 10. HTN: Monitor BP bid. Continue isosorbide bid, labetalol tid, clonidine tid and norvasc daily.  Vitals:   07/05/17 2147 07/06/17 0436  BP: (!) 167/67 (!) 179/77  Pulse:  71  Resp:  18  Temp:  98.1 F (36.7 C)  SpO2:  100%  Hydralazine 10 3 times a day started on 1/30, increased to 25 on 1/31, increased to 50 on 2/4, increased to 100 on 2/6   Remains uncontrolled, however, improved from baseline after discussion with Nephro, remains at risk for further end organ damage 11. Pruritis: Question due to rise in phosphorous levels. Sarna tid with benadryl prn.  12. Ascites: Monitor weight daily.  Filed Weights   07/04/17 1843 07/06/17 0436  Weight: 77 kg (169 lb 12.1 oz) 81 kg (178 lb 9.2 oz)    Incomplete tap on 1/29, removing 2 L, removed 7.9 L on 2/6, appreciate VIR   Fluid analysis remains pending 2/10   Plan for repeat this week 13. ESRD   Recs per Nephro- appreciate Nephro note 14. Acute on chronic anemia  Hb 8.5 on 2/8   Cont to monitor 15. Leukocytosis   WBCs 12.5 on 2/8   Afebrile  Cont to monitor   LOS (Days) 13 A FACE TO FACE EVALUATION WAS PERFORMED  Leeum Sankey Lorie Phenix 07/06/2017 3:00 PM

## 2017-07-07 ENCOUNTER — Inpatient Hospital Stay (HOSPITAL_COMMUNITY): Payer: Medicare Other

## 2017-07-07 ENCOUNTER — Inpatient Hospital Stay (HOSPITAL_COMMUNITY): Payer: Medicare Other | Admitting: Occupational Therapy

## 2017-07-07 LAB — CBC
HCT: 29.4 % — ABNORMAL LOW (ref 36.0–46.0)
Hemoglobin: 8.7 g/dL — ABNORMAL LOW (ref 12.0–15.0)
MCH: 27.5 pg (ref 26.0–34.0)
MCHC: 29.6 g/dL — ABNORMAL LOW (ref 30.0–36.0)
MCV: 93 fL (ref 78.0–100.0)
Platelets: 256 10*3/uL (ref 150–400)
RBC: 3.16 MIL/uL — ABNORMAL LOW (ref 3.87–5.11)
RDW: 17.9 % — AB (ref 11.5–15.5)
WBC: 12.4 10*3/uL — AB (ref 4.0–10.5)

## 2017-07-07 LAB — BASIC METABOLIC PANEL
Anion gap: 16 — ABNORMAL HIGH (ref 5–15)
BUN: 54 mg/dL — AB (ref 6–20)
CHLORIDE: 96 mmol/L — AB (ref 101–111)
CO2: 25 mmol/L (ref 22–32)
CREATININE: 5.93 mg/dL — AB (ref 0.44–1.00)
Calcium: 7.9 mg/dL — ABNORMAL LOW (ref 8.9–10.3)
GFR calc Af Amer: 9 mL/min — ABNORMAL LOW (ref >60)
GFR, EST NON AFRICAN AMERICAN: 8 mL/min — AB (ref >60)
GLUCOSE: 135 mg/dL — AB (ref 65–99)
Potassium: 4.7 mmol/L (ref 3.5–5.1)
SODIUM: 137 mmol/L (ref 135–145)

## 2017-07-07 LAB — GLUCOSE, CAPILLARY
GLUCOSE-CAPILLARY: 71 mg/dL (ref 65–99)
GLUCOSE-CAPILLARY: 204 mg/dL — AB (ref 65–99)
Glucose-Capillary: 119 mg/dL — ABNORMAL HIGH (ref 65–99)
Glucose-Capillary: 53 mg/dL — ABNORMAL LOW (ref 65–99)
Glucose-Capillary: 146 mg/dL — ABNORMAL HIGH (ref 65–99)

## 2017-07-07 LAB — CULTURE, BODY FLUID-BOTTLE: CULTURE: NO GROWTH

## 2017-07-07 LAB — CULTURE, BODY FLUID W GRAM STAIN -BOTTLE

## 2017-07-07 MED ORDER — LIDOCAINE HCL (PF) 1 % IJ SOLN: 5.0000 mL | INTRAMUSCULAR | Status: DC | PRN

## 2017-07-07 MED ORDER — SODIUM CHLORIDE 0.9 % IV SOLN: 100.0000 mL | INTRAVENOUS | Status: DC | PRN

## 2017-07-07 MED ORDER — HEPARIN SODIUM (PORCINE) 1000 UNIT/ML DIALYSIS
1000.0000 [IU] | INTRAMUSCULAR | Status: DC | PRN
  Filled 2017-07-07: qty 1

## 2017-07-07 MED ORDER — SODIUM CHLORIDE 0.9 % IV SOLN
1.0000 g | INTRAVENOUS | Status: DC
  Administered 2017-07-07 – 2017-07-08 (×2): 1 g via INTRAVENOUS
  Filled 2017-07-07 (×3): qty 1

## 2017-07-07 MED ORDER — LIDOCAINE-PRILOCAINE 2.5-2.5 % EX CREA
1.0000 "application " | TOPICAL_CREAM | CUTANEOUS | Status: DC | PRN
  Filled 2017-07-07: qty 5

## 2017-07-07 MED ORDER — ALTEPLASE 2 MG IJ SOLR
2.0000 mg | Freq: Once | INTRAMUSCULAR | Status: DC | PRN
  Filled 2017-07-07: qty 2

## 2017-07-07 MED ORDER — PENTAFLUOROPROP-TETRAFLUOROETH EX AERO: 1.0000 "application " | INHALATION_SPRAY | CUTANEOUS | Status: DC | PRN

## 2017-07-07 NOTE — Progress Notes (Addendum)
Physical Therapy Note  Patient Details  Name: [name] MRN: [id number] Date of Birth: 1975/10/09 Today's Date: 07/07/2017  1000-1030, 30 min individual tx Pain: " a little bit" distal residual limb, declined   Bed mobility in flat, inflated bed without rails, R side lying to sit with mod assist to elevate trunk , max cues for technique.  Pt stated she may get railings for her bed at home.  She believes her bed is low, and husband will bring in measurements today.  She stated she gets out of the R side of bed.   Pt placed slideboard and transferred to L with mod cues, close supervision. Pt drove power w/c on level tile with supervision, bumping bed without awareness as she exited room.  Seated R short arc quad knee extension. L long arc quad knee extension x 5 x 2 each; bil hip adduction against towel roll, L heel raises, L resisted hip extension against towel roll under thigh., bil hip abduction against manual resistance. As she attempted to park power w/c, pt apparently accidentlally put w/c into fast speed setting; she bumped door on L; L residual limb was crossed over her RLE, so did not encounter door.  PT stressed need at this time for pt to check speed and gears to prevent injuries.  Pt able to stated options for pressure relief when sitting in w/c.  Pt left resting in w/c with all needs within reach.  See function navigator for current status.  Jelina Paulsen 07/07/2017, 7:57 AM

## 2017-07-07 NOTE — Consult Note (Signed)
Reason for Consult:Left ischial ulcer Referring Physician: Dr. Delice Lesch, Physical Medicine and [facility] Date 07/07/2017  [name] is an 42 y.o. female.  HPI: [name] is a 42 yo female who was readmitted following a fall from skilled nursing facility on 06/17/17 with Head CT showing possible small hemorrhagic mass.  She has multiple medical problems, ESRD on HD,CAD, T2DM,  recent cardiac arrest during revascularization procedures of the right lower leg 04/16/17. She had a long hospital stay with vent dependency requiring trach placement and placement in LTAC, [facility].   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 She has had significant ascites requiring frequent paracentesis for large volumes of up to 9+ liters at a time . This was felt to be due to volume overload. She was admitted to rehab 06/23/17.   She has had a left ischial ulcer for several months now and recent CT scans are consistent with osteomyelitis. She remains on IV antibiotic therapy per ID.  We are asked to evaluate the left ischial wound.  Past Medical History:  Diagnosis Date  . Arthritis   . CHF (congestive heart failure) ([place])   . Chronic anemia    2nd to renal disease  . CIN III (cervical intraepithelial neoplasia grade III) with severe dysplasia    S/P LEEP AND CONE  . Coronary artery disease    Status post cardiac catheterization June 2012 scattered coronary artery disease/atherosclerosis with 70-80% stenosis in a small right PDA.  . Diabetic Charcot's joint disease ([place])   . DM (diabetes mellitus) ([place])    Long-term insulin  . End stage renal disease on dialysis [facility]) 05/02/11   "[facility]"; [facility]; M; W, F ("got it 04/15/2017 because of Thanksgiving")  . Fracture of 5th metatarsal 2016   Right  . Gastroparesis   . Heart murmur   . 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  History of abscesses in groins 12/06/2010  . Hyperlipidemia    Hypertriglyceridemia 449 HDL 25  . Hypertension   . Migraines    "just on dialysis days"  . Peripheral neuropathy    related to DM  . Peripheral vascular disease ([place])    Tibial occlusive disease evaluated by Dr. Kellie Simmering in August 2011. Medical therapy  . Renal insufficiency    Dialysis since 2012  . Seizures ([place])    "only when her sugar drops" (04/17/2017)  . Tobacco use disorder    Discontinued March 2012  . Trimalleolar fracture of ankle, closed 02/09/2015   Right    Past Surgical History:  Procedure Laterality Date  . ABDOMINAL AORTOGRAM W/LOWER EXTREMITY [name] 03/27/2017   Procedure: ABDOMINAL AORTOGRAM W/LOWER EXTREMITY;  Surgeon: Conrad [place], MD;  Location: [place] [facility];  Service: Cardiovascular;  Laterality: [name];  . AMPUTATION Right 05/09/2017   Procedure: AMPUTATION TRANSMETATARSAL OF RIGHT FOOT;  Surgeon: Serafina Mitchell, MD;  Location: [facility];  Service: Vascular;  Laterality: Right;  . AMPUTATION Right 06/17/2017   Procedure: AMPUTATION BELOW KNEE RIGHT;  Surgeon: Serafina Mitchell, MD;  [facility];  Service: Vascular;  Laterality: Right;  . AV FISTULA PLACEMENT Left 04/2010   "lower arm"  . CARDIAC CATHETERIZATION [name] 02/10/2015   Procedure: Left Heart Cath and Coronary Angiography;  Surgeon: Dixie Dials, MD;  Location: [place] [facility];  Service: Cardiovascular;  Laterality: [name];  . CARDIAC CATHETERIZATION [name] 02/21/2016   Procedure: Right  Heart Cath;  Surgeon: Jolaine Artist, MD;  Location: [place] [facility];  Service: Cardiovascular;  Laterality: [name];  . CATARACT EXTRACTION W/ INTRAOCULAR LENS  IMPLANT, BILATERAL Bilateral   . CERVICAL BIOPSY  W/ LOOP ELECTRODE EXCISION     h/o  . CERVICAL CONE BIOPSY     h/o  . COLPOSCOPY    . DILATION AND CURETTAGE OF UTERUS  2009  . ENDARTERECTOMY FEMORAL Right 04/16/2017   Procedure: RIGHT FEMORAL ENDARTERECTOMY;  Surgeon: Serafina Mitchell, MD;   Location: [place];  Service: Vascular;  Laterality: Right;  . FRACTURE SURGERY    . HERNIA REPAIR    . INCISION AND DRAINAGE ABSCESS     "groin"  . INSERTION OF DIALYSIS CATHETER Left 04/16/2017   Procedure: INSERTION of temporay DIALYSIS CATHETER, left femoral artery;  Surgeon: Serafina Mitchell, MD;  Location: [place];  Service: Vascular;  Laterality: Left;  . IR HYBRID TRAUMA EMBOLIZATION  04/16/2017  . IR PARACENTESIS  06/12/2017  . IR PARACENTESIS  06/24/2017  . IR PARACENTESIS  07/01/2017  . ORIF ANKLE FRACTURE Right 02/16/2015   Procedure: OPEN REDUCTION INTERNAL FIXATION (ORIF) RIGHT ANKLE FRACTURE;  Surgeon: Altamese Keysville, MD;  Location: [place];  Service: Orthopedics;  Laterality: Right;  . PATCH ANGIOPLASTY Right 04/16/2017   Procedure: PATCH ANGIOPLASTY OF RIGHT FEMORAL ARTERY USING BOVINE PERICARDIUM PATCH;  Surgeon: Serafina Mitchell, MD;  [facility];  Service: Vascular;  Laterality: Right;  . UMBILICAL HERNIA REPAIR  X 2  . WOUND EXPLORATION Right 05/09/2017   Procedure: EXPLORATION RIGHT GROIN;  Surgeon: Serafina Mitchell, MD;  [facility];  Service: Vascular;  Laterality: Right;    Family History  Problem Relation Age of Onset  . Diabetes Mother   . Hypertension Mother   . Diabetes Father   . Hyperlipidemia Father   . Hypertension Father   . Diabetes Sister   . Stroke Maternal Grandmother   . Diabetes Sister   . Breast cancer Maternal Aunt        Age 71's    Social History:  reports that she quit smoking about 5 years ago. Her smoking use included cigarettes. She has a 3.00 pack-year smoking history. she has never used smokeless tobacco. She reports that she does not drink alcohol or use drugs.  Allergies:  Allergies  Allergen Reactions  . Cephalexin Other (See Comments)    Reaction unknown- Childhood allergy Tolerated Ceftriaxone in the past  . Sulfamethoxazole-Trimethoprim Other (See Comments)    Unknown reaction. Pt states that she was told by her mother that  she had allergy to Bactrim as a child.    Medications: I have reviewed the patient's current medications.  Results for orders placed or performed during the hospital encounter of 06/23/17 (from the past 48 hour(s))  Glucose, capillary     Status: Abnormal   Collection Time: 07/05/17  4:56 PM  Result Value Ref Range   Glucose-Capillary 211 (H) 65 - 99 mg/dL  Glucose, capillary     Status: Abnormal   Collection Time: 07/05/17  8:51 PM  Result Value Ref Range   Glucose-Capillary 151 (H) 65 - 99 mg/dL  Glucose, capillary     Status: None   Collection Time: 07/06/17  6:40 AM  Result Value Ref Range   Glucose-Capillary 84 65 - 99 mg/dL  Glucose, capillary     Status: Abnormal   Collection Time: 07/06/17 11:58 AM  Result Value Ref Range   Glucose-Capillary 118 (H)  65 - 99 mg/dL  Glucose, capillary     Status: Abnormal   Collection Time: 07/06/17  4:39 PM  Result Value Ref Range   Glucose-Capillary 165 (H) 65 - 99 mg/dL  Glucose, capillary     Status: Abnormal   Collection Time: 07/06/17  9:15 PM  Result Value Ref Range   Glucose-Capillary 218 (H) 65 - 99 mg/dL  Basic metabolic panel     Status: Abnormal   Collection Time: 07/07/17  5:19 AM  Result Value Ref Range   Sodium 137 135 - 145 mmol/L   Potassium 4.7 3.5 - 5.1 mmol/L   Chloride 96 (L) 101 - 111 mmol/L   CO2 25 22 - 32 mmol/L   Glucose, Bld 135 (H) 65 - 99 mg/dL   BUN 54 (H) 6 - 20 mg/dL   Creatinine, Ser 5.93 (H) 0.44 - 1.00 mg/dL   Calcium 7.9 (L) 8.9 - 10.3 mg/dL   GFR calc non Af Amer 8 (L) >60 mL/min   GFR calc Af Amer 9 (L) >60 mL/min    Comment: (NOTE) The eGFR has been calculated using the CKD EPI equation. This calculation has not been validated in all clinical situations. eGFR's persistently <60 mL/min signify possible Chronic Kidney Disease.    Anion gap 16 (H) 5 - 15    Comment: Performed at [facility], [place] [address] [place], [place] [address]  CBC     Status: Abnormal   Collection Time:  07/07/17  5:19 AM  Result Value Ref Range   WBC 12.4 (H) 4.0 - 10.5 K/uL   RBC 3.16 (L) 3.87 - 5.11 MIL/uL   Hemoglobin 8.7 (L) 12.0 - 15.0 g/dL   HCT 29.4 (L) 36.0 - 46.0 %   MCV 93.0 78.0 - 100.0 fL   MCH 27.5 26.0 - 34.0 pg   MCHC 29.6 (L) 30.0 - 36.0 g/dL   RDW 17.9 (H) 11.5 - 15.5 %   Platelets 256 150 - 400 K/uL    Comment: Performed at [facility], [place] [address] [place], [place] [address]  Glucose, capillary     Status: Abnormal   Collection Time: 07/07/17  6:37 AM  Result Value Ref Range   Glucose-Capillary 119 (H) 65 - 99 mg/dL  Glucose, capillary     Status: Abnormal   Collection Time: 07/07/17 11:37 AM  Result Value Ref Range   Glucose-Capillary 53 (L) 65 - 99 mg/dL  Glucose, capillary     Status: None   Collection Time: 07/07/17 12:11 PM  Result Value Ref Range   Glucose-Capillary 71 65 - 99 mg/dL    No results found.  ROS Blood pressure 129/63, pulse 70, temperature 98.1 F (36.7 C), temperature source Oral, resp. rate 18, height '6\' 4"'  (1.93 m), weight 82 kg (180 lb 12.4 oz), last menstrual period 10/09/2015, SpO2 100 %. Physical Exam  Constitutional:  Thin chronically ill appearing female sitting up in power wheelchair initially in NAD.   Cardiovascular: Normal rate.  Respiratory: Effort normal.  Musculoskeletal:  Right BKA dressing intact  Skin:  Left ischial wound is clean, pink hypergranulation especially in the central wound bed noted. Epibole of the wound edges is marked. The wound is ~ 5 x 4 x 3 cm with exposed bone and moderate green tan drainage on the dressings. Subjective sensation in the peri wound area.     Assessment/Plan: Left ischial ulcer , stage IV - Would continue local care and pressure relief, nutritional support .  Patient not currently good surgical candidate, but should follow up as outpatient in the office following anticipated discharge later this week from rehab.   Kahleb Mcclane,PA-C Plastic Surgery [phone]

## 2017-07-07 NOTE — Progress Notes (Signed)
Occupational Therapy Session Note  Patient Details  Name: [name] MRN: [id number] Date of Birth: 09-12-1975  Today's Date: 07/07/2017 OT Individual Time: 0900-1000 OT Individual Time Calculation (min): 60 min  Short Term Goals: Week 2:  OT Short Term Goal 1 (Week 2): Pt will tolerate sitting on a padded BSC for 5- 10 min. OT Short Term Goal 2 (Week 2): Pt will be able to use lateral leans to pull pants up/down hips from sitting on a BSC. OT Short Term Goal 3 (Week 2): Pt will be able to tolerate standing in the [place] lift for 2 min to develop standing tolerance.      Skilled Therapeutic Interventions/Progress Updates:    Pt greeted supine in bed. Agreeable to shower. Slideboard<PWC completed with assist for steadying board. Pt able to place board herself prior to transfer. Slideboard transfer completed in similar manner to padded TTB. Residual limb and IV covered. Pt bathing while seated, utilizing lateral leans for pericare and able to reach back using bilateral UEs. Figure 4 for washing Lt foot. Afterwards she completed slideboard transfer<PWC<EOB with steady assist to engage in grooming/UB dressing tasks. When pt transitioned to supine, RN arrived to change sacral and groin dressings prior to dressing LB. Pt required overall Mod A for elevating pants over hips and donning Lt gripper sock while rolling Rt<Lt as needed. Pt was left in sidelying with all needs within reach and 4 bedrails up. Anticipating next therapist.     Therapy Documentation Precautions:  Precautions Precautions: Fall Precaution Comments: wound vac from groin, sacral decubital wound  Restrictions Weight Bearing Restrictions: Yes RLE Weight Bearing: Non weight bearing Vital Signs: Therapy Vitals Temp: 98.1 F (36.7 C) Temp Source: Oral Pulse Rate: 70 Resp: 18 BP: 129/63 Patient Position (if appropriate): Sitting Oxygen Therapy SpO2: 100 % O2 Device: Not Delivered Pain: Pain Assessment Pain Assessment:  No/denies pain Pain Score: 0-No pain ADL: ADL ADL Comments: see functional naviagtor     See Function Navigator for Current Functional Status.   Therapy/Group: Individual Therapy  Mehkai Gallo A Bryden Darden 07/07/2017, 12:51 PM

## 2017-07-07 NOTE — Progress Notes (Signed)
Physical Therapy Session Note  Patient Details  Name: [name] MRN: [id number] Date of Birth: 12/03/1975  Today's Date: 07/07/2017 PT Individual Time: 1300-1400 PT Individual Time Calculation (min): 60 min   Short Term Goals: Week 2:  PT Short Term Goal 1 (Week 2): =LTG  Skilled Therapeutic Interventions/Progress Updates:    Pt participated in specialty w/c seating evaluation with Luz Brazen, ATP from [facility] with PT providing recommendations for pressure relieving cushion, power w/c features for adequate weightshifting for pressure relief and support features, and recommendations in regards to mobility level and general strength and balance abilities. Pt performed supervision level slideboard transfers w/c <> mat including placement of slideboard. Husband present for evaluation and will be here tomorrow for hands on family training. Pt able to drive and position w/c for transfers during session modified independent. Engaged in seated core strengthening exercises during therapeutic rest breaks with 2.2# weighted medicine ball for trunk rotation and PNF diagonals x 15 reps x 2 sets each direction.   Therapy Documentation Precautions:  Precautions Precautions: Fall Precaution Comments: wound vac from groin, sacral decubital wound  Restrictions Weight Bearing Restrictions: Yes RLE Weight Bearing: Non weight bearing  Pain: Reports pain in residual limb at 7/10 - declines intervention. Pt maintains flexed position and knee cross throughout session. Continued to educate on use of KI for extension positioning.    See Function Navigator for Current Functional Status.   Therapy/Group: Individual Therapy  Canary Brim Ivory Broad, PT, DPT  07/07/2017, 2:44 PM

## 2017-07-07 NOTE — Progress Notes (Signed)
[facility] ROUNDING NOTE   Subjective:   Interval History: This is a 42 year old lady end stage renal disease that dialyzes MWF  She was admtt[facility] from [facility] 1/16 after falling CT head revealed hyperdense focus in left posterior parietal region suspicious for small hemorrhagic mass, reviewed   she had a very prolonged hospitalization in November and December with a  sacral decub and RLE ischemia requiring right metatarsal amputation with exploration of right groin wound 04/2017.  CT abdomen/pelvis done due to stage IV sacral decub and wound drainage and was positive for sacral osteomyelitis. She was started on IV Vanc and Zosyn--ID following for input. 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 Trula Slade She continues to have VAC to right groin. She has difficulty sitting for HD due to sacral pain with question of ability to tolerate outpatient HD.           Objective:  Vital signs in last 24 hours:  Temp:  [97.6 F (36.4 C)-98.5 F (36.9 C)] 97.6 F (36.4 C) (02/11 0323) Pulse Rate:  [77-80] 80 (02/11 0323) Resp:  [17-18] 18 (02/11 0323) BP: (160-187)/(73-80) 177/73 (02/11 0323) SpO2:  [99 %-100 %] 100 % (02/11 0323) Weight:  [180 lb 12.4 oz (82 kg)] 180 lb 12.4 oz (82 kg) (02/11 0323)  Weight change: 2 lb 3.3 oz (1 kg) Filed Weights   07/04/17 1843 07/06/17 0436 07/07/17 0323  Weight: 169 lb 12.1 oz (77 kg) 178 lb 9.2 oz (81 kg) 180 lb 12.4 oz (82 kg)    Intake/Output: I/O last 3 completed shifts: In: 1860 [P.O.:1500; I.V.:210; NG/GT:100; IV Piggyback:50] Out: 200 [Urine:200]   Intake/Output this shift:  No intake/output data recorded.  awake and alert Regular S1S2 No S3 Lungs clear Abd tight but not taut(ascites) + BS. Not tender R groinwound not examined R BKA stump with 1+ edema shrinker onand in immobilizer No LLE edema Sacral wounds coverednot examined Muscle wasting throughout Dialysis  Access: left AVF + bruit      Basic Metabolic Panel: Recent Labs  Lab 06/30/17 1517 07/02/17 1325 07/04/17 1331 07/07/17 0519  NA 133* 135 135 137  K 5.2* 4.7 4.8 4.7  CL 96* 95* 94* 96*  CO2 24 24 26 25   GLUCOSE 136* 228* 96 135*  BUN 55* 51* 44* 54*  CREATININE 5.92* 5.48* 4.97* 5.93*  CALCIUM 7.6* 7.7* 7.9* 7.9*  PHOS 3.6 3.6 4.4  --     Liver Function Tests: Recent Labs  Lab 06/30/17 1517 07/02/17 1325 07/04/17 1331  ALBUMIN 1.7* 1.6* 1.7*   No results for input(s): LIPASE, AMYLASE in the last 168 hours. No results for input(s): AMMONIA in the last 168 hours.  CBC: Recent Labs  Lab 06/30/17 1516 07/02/17 1325 07/04/17 1331 07/07/17 0519  WBC 12.9* 12.3* 12.5* 12.4*  HGB 8.4* 8.1* 8.5* 8.7*  HCT 28.1* 27.3* 28.0* 29.4*  MCV 90.4 91.6 91.5 93.0  PLT 315 300 262 256    Cardiac Enzymes: No results for input(s): CKTOTAL, CKMB, CKMBINDEX, TROPONINI in the last 168 hours.  BNP: Invalid input(s): POCBNP  CBG: Recent Labs  Lab 07/06/17 0640 07/06/17 1158 07/06/17 1639 07/06/17 2115 07/07/17 0637  GLUCAP 84 118* 165* 218* 119*    Microbiology: Results for orders placed or performed during the hospital encounter of 06/23/17  Fungus Culture With Stain     Status: None   Collection Time: 06/24/17  3:39 PM  Result Value  Ref Range Status   Fungus Stain Final report  Final    Comment: (NOTE) Performed At: [facility] [address], [place] [id number] Rush Farmer MD AS:[id number]    Fungus (Mycology) Culture PENDING  Incomplete   Fungal Source FLUID  Corrected    Comment: PERITONEAL CORRECTED ON 01/30 AT 9892: PREVIOUSLY REPORTED AS BLOOD   Culture, body fluid-bottle     Status: None   Collection Time: 06/24/17  3:39 PM  Result Value Ref Range Status   Specimen Description FLUID PERITONEAL  Final   Special Requests BOTTLES DRAWN AEROBIC AND ANAEROBIC 10CC  Final   Culture   Final    NO GROWTH 5 DAYS Performed at [facility], [place] [address], [place], [place] [address]    Report Status 06/30/2017 FINAL  Final  Gram stain     Status: None   Collection Time: 06/24/17  3:39 PM  Result Value Ref Range Status   Specimen Description FLUID PERITONEAL  Final   Special Requests NONE  Final   Gram Stain   Final    RARE WBC PRESENT, PREDOMINANTLY MONONUCLEAR NO ORGANISMS SEEN    Report Status 06/25/2017 FINAL  Final  Fungus Culture Result     Status: None   Collection Time: 06/24/17  3:39 PM  Result Value Ref Range Status   Result 1 Comment  Final    Comment: (NOTE) KOH/Calcofluor preparation:  no fungus observed. Performed At: [facility] [address] [place], [place] [id number] Rush Farmer MD EH:[id number]   Fungus Culture With Stain     Status: None (Preliminary result)   Collection Time: 07/01/17  3:18 PM  Result Value Ref Range Status   Fungus Stain Final report  Final    Comment: (NOTE) Performed At: [facility] [place], [place] [id number] Rush Farmer MD XA:[id number]    Fungus (Mycology) Culture PENDING  Incomplete   Fungal Source FLUID  Final    Comment: PERITONEAL  Culture, body fluid-bottle     Status: None (Preliminary result)   Collection Time: 07/01/17  3:18 PM  Result Value Ref Range Status   Specimen Description FLUID PERITONEAL  Final   Special Requests NONE  Final   Culture   Final    NO GROWTH 4 DAYS Performed at [facility], [address], [place], [place] [address]    Report Status PENDING  Incomplete  Gram stain     Status: None   Collection Time: 07/01/17  3:18 PM  Result Value Ref Range Status   Specimen Description PERITONEAL FLUID  Final   Special Requests NONE  Final   Gram Stain NO WBC SEEN NO ORGANISMS SEEN   Final   Report Status 07/02/2017 FINAL  Final  Fungus Culture Result     Status: None   Collection Time: 07/01/17  3:18 PM  Result Value Ref Range Status   Result 1 Comment  Final    Comment:  (NOTE) KOH/Calcofluor preparation:  no fungus observed. Performed At: [facility] [place], [place] [id number] Rush Farmer MD UT:[id number]     Coagulation Studies: No results for input(s): LABPROT, INR in the last 72 hours.  Urinalysis: No results for input(s): COLORURINE, LABSPEC, PHURINE, GLUCOSEU, HGBUR, BILIRUBINUR, KETONESUR, PROTEINUR, UROBILINOGEN, NITRITE, LEUKOCYTESUR in the last 72 hours.  Invalid input(s): APPERANCEUR    Imaging: No results found.   Medications:   . cefTAZidime (FORTAZ)  IV Stopped (07/06/17 1500)  . cefTAZidime (FORTAZ)  IV    . vancomycin 750 mg (07/04/17 1716)   . amLODipine  10 mg Oral QHS  . atorvastatin  40 mg Oral q1800  . cinacalcet  60 mg Oral Q supper  . cloNIDine  0.3 mg Oral TID  . darbepoetin (ARANESP) injection - DIALYSIS  200 mcg Intravenous Q Fri-HD  . docusate sodium  100 mg Oral TID  . fentaNYL  25 mcg Transdermal Q72H  . ferric citrate  420 mg Oral TID WC  . gabapentin  100 mg Oral BID  . heparin  5,000 Units Subcutaneous Q8H  . hydrALAZINE  100 mg Oral Q8H  . insulin aspart  0-9 Units Subcutaneous TID WC  . insulin glargine  20 Units Subcutaneous Daily  . insulin glargine  20 Units Subcutaneous QHS  . isosorbide dinitrate  60 mg Oral BID  . labetalol  300 mg Oral TID  . metoCLOPramide  2.5 mg Oral TID AC  . multivitamin  1 tablet Oral QHS  . pantoprazole  40 mg Oral Daily  . protein supplement  1 scoop Oral TID WC  . sertraline  50 mg Oral Daily  . sevelamer carbonate  2,400 mg Oral TID WC   acetaminophen, aluminum hydroxide, bisacodyl, diphenhydrAMINE, guaiFENesin-dextromethorphan, LORazepam, oxyCODONE, phenol, polyethylene glycol, prochlorperazine **OR** prochlorperazine **OR** prochlorperazine, traMADol, traZODone  Assessment/ Plan:  Dialysis: MWF [facility] 4h 58min  85.5kg 2/2.25  L AVF  No heparin for now due to small IC bleed by CT 1/16 (was on Hep 2000)  -mircera 200 every 2 wks       Impression: 1  Sacral decubdifficulty sitting for HD due to sacral pain with question of ability to tolerate outpatient HD. CIR recommended due to functional deficits. 2  BKA 3  R groin wound 4  ESRD HD mwf, 5  Fall / left parietal bleed, small 6  Ascites - periodic paracentesis (7.9 L para last wk 2/5) 7  Anemia ckd, darbe 200/ wk 8  HTN chron/ severe, normal SBP is 160 - 200, cont mult BP meds, avoid SBP < 140 w HD if possible 9  Volume wt's down after tap last wk, below dry 10  CAD 11  DM2     LOS: 14 Naryiah Schley W @TODAY @8 :53 AM

## 2017-07-07 NOTE — Progress Notes (Signed)
[facility]IC[facility] RE[facility]ABILITATION     PROGRESS NOTE  Subjective/Complaints:  Patient seen lying in bed this morning.  She states she is Well overnight.  She states she tries to keep her BKA extended as much as possible.  ROS: Denies CP, SOB, nausea, vomiting, diarrhea.  Objective: Vital Signs: Blood pressure (!) 177/73, pulse 80, temperature 97.6 F (36.4 C), temperature source Oral, resp. rate 18, height 6\' 4"  (1.93 m), weight 82 kg (180 lb 12.4 oz), last menstrual period 10/09/2015, SpO2 100 %. No results found. Recent Labs    07/04/17 1331 07/07/17 0519  WBC 12.5* 12.4*  HGB 8.5* 8.7*  HCT 28.0* 29.4*  PLT 262 256   Recent Labs    07/04/17 1331 07/07/17 0519  NA 135 137  K 4.8 4.7  CL 94* 96*  GLUCOSE 96 135*  BUN 44* 54*  CREATININE 4.97* 5.93*  CALCIUM 7.9* 7.9*   CBG (last 3)  Recent Labs    07/06/17 1639 07/06/17 2115 07/07/17 0637  GLUCAP 165* 218* 119*    Wt Readings from Last 3 Encounters:  07/07/17 82 kg (180 lb 12.4 oz)  06/23/17 76.8 kg (169 lb 6.4 oz)  06/11/17 83.5 kg (184 lb 1.3 oz)    Physical Exam:  BP (!) 177/73 (BP Location: Right Arm)   Pulse 80   Temp 97.6 F (36.4 C) (Oral)   Resp 18   Ht 6\' 4"  (1.93 m)   Wt 82 kg (180 lb 12.4 oz)   LMP 10/09/2015 Comment: pt on dialysis  SpO2 100%   BMI 22.00 kg/m  Constitutional: She has a sickly appearance. No distress.  HENT: Normocephalic and atraumatic.  Eyes:  and EOM are normal. No discharge.  Cardiovascular: RRR. No JVD. Respiratory: Effort normal and breath sounds normal.  GI: Bowel sounds are normal. She exhibits distension. Ascites, persistent.  Musculoskeletal: She exhibits mild edema.  Neurological: She is alert and oriented.  Motor: 4-/5 B/l UE, LLE, RLE HF (stable)  Skin: Skin is warm and dry. She is not diaphoretic.  Ischial Ulcer, not examined today BKA with dressing c/d/i Dusky left foot Psychiatric: Her speech is normal. Blunt.  Assessment/Plan: 1.  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 Physiatrist is providing close team supervision and 24 hour management of active medical problems listed below. Physiatrist and rehab team continue to assess barriers to discharge/monitor patient progress toward functional and medical goals.  Function:  Bathing Bathing position   Position: Bed  Bathing parts Body parts bathed by patient: Right arm, Left arm, Chest, Abdomen Body parts bathed by helper: Front perineal area, Buttocks, Right upper leg, Left upper leg, Right lower leg, Left lower leg  Bathing assist Assist Level: (40%, maximal assist level)      Upper Body Dressing/Undressing Upper body dressing   What is the patient wearing?: Pull over shirt/dress     Pull over shirt/dress - Perfomed by patient: Thread/unthread right sleeve, Thread/unthread left sleeve, Put head through opening, Pull shirt over trunk(pt self reported) Pull over shirt/dress - Perfomed by helper: Pull shirt over trunk        Upper body assist Assist Level: Set up      Lower Body Dressing/Undressing Lower body dressing   What is the patient wearing?: Pants     Pants- Performed by patient: Thread/unthread right pants leg, Thread/unthread left pants leg, Pull pants up/down(Pt self reported) Pants-  Performed by helper: Thread/unthread right pants leg, Thread/unthread left pants leg, Pull pants up/down       Socks - Performed by helper: Don/doff right sock, Don/doff left sock   Shoes - Performed by helper: Don/doff right shoe, Don/doff left shoe, Fasten right, Fasten left          Lower body assist Assist for lower body dressing: Set up      Toileting Toileting     Toileting steps completed by helper: Adjust clothing prior to toileting, Performs perineal hygiene, Adjust clothing after toileting    Toileting assist      Transfers Chair/bed transfer   Chair/bed transfer method: Lateral scoot Chair/bed transfer assist level: Touching or steadying assistance (Pt > 75%) Chair/bed transfer assistive device: Armrests, Sliding board Mechanical lift: Maximove   Locomotion Ambulation Ambulation activity did not occur: Safety/medical Editor, commissioning activity did not occur: (pt in TIS at this time) Type: Motorized Max wheelchair distance: 150' Assist Level: Supervision or verbal cues  Cognition Comprehension Comprehension assist level: Follows complex conversation/direction with extra time/assistive device  Expression Expression assist level: Expresses complex ideas: With extra time/assistive device  Social Interaction Social Interaction assist level: Interacts appropriately with others with medication or extra time (anti-anxiety, antidepressant).  Problem Solving Problem solving assist level: Solves complex problems: With extra time  Memory Memory assist level: More than reasonable amount of time    Medical Problem List and Plan: 1.  Deficits with mobility, transfers, self-care secondary to Right BKA and wounds.   Continue CIR   2.  DVT Prophylaxis/Anticoagulation: Pharmaceutical: Lovenox 3. Pain Management:    Fentanyl 25 mcg patch    IV dilaudid transitioned to oxycodone prn.     Gabapentin increased to 100 twice a day on 2/6 4. Mood: Offer Ego support.  5. Neuropsych: This patient is capable of making decisions on her own behalf. 6. Skin/Wound Care: has evidence of malnutrition. Has been refusing nephro as well as protein supplements. Encourage intake to help promote wound healing as well as issues with ascites.    Ischial  Decub: Air mattress overlay for pressure relief measures. Added protein supplement for malnutrition. Dakin's solution with damp to dry dressing. May need plastics consult.            Right groin wound 7. Fluids/Electrolytes/Nutrition: Renal diet with 1200  cc FR. Monitor daily weights.  8. Left Ischium decub with osteo: Daily local care with saline dressing. Need to encourage intake of supplements to promote wound healing.     Per ID, Vanc/Ceftazidime x6 weeks after discussion   Vanc level within normal limits on 2/6 9. T2DM: Monitor BS ac/hs.    Continue Lantus 20 units in the a.m.   Lantus 14 units at bedtime and increase to 19 units on 2/1, increased further over the weekend, decreased to 20 units on 2/4, increased to 21 units on 2/5   SSI for elevated BS.    CBG (last 3)  Recent Labs    07/06/17 1639 07/06/17 2115 07/07/17 0637  GLUCAP 165* 218* 119*    Relatively controlled on 2/11, with one spike last night 10. HTN: Monitor BP bid. Continue isosorbide bid, labetalol tid, clonidine tid and norvasc daily.  Vitals:   07/06/17 2116 07/07/17 0323  BP: (!) 187/80 (!) 177/73  Pulse: 79 80  Resp: 18 18  Temp: 98.5 F (36.9 C) 97.6 F (36.4 C)  SpO2: 99% 100%     Hydralazine  10 3 times a day started on 1/30, increased to 25 on 1/31, increased to 50 on 2/4, increased to 100 on 2/6   Hypertensive crisis overnight, remains uncontrolled, however, improved from baseline after discussion with Nephro, remains at risk for further end organ damage 11. Pruritis: Question due to rise in phosphorous levels. Sarna tid with benadryl prn.  12. Ascites: Monitor weight daily.  Filed Weights   07/04/17 1843 07/06/17 0436 07/07/17 0323  Weight: 77 kg (169 lb 12.1 oz) 81 kg (178 lb 9.2 oz) 82 kg (180 lb 12.4 oz)    Incomplete tap on 1/29, removing 2 L, removed 7.9 L on 2/6, appreciate VIR   Fluid analysis remains pending 2/11   Plan for repeat this week 13. ESRD   Recs per Nephro- appreciate Nephro note 14. Acute on chronic anemia  Hb 8.7 on 2/11   Cont to monitor 15. Leukocytosis, persistent   WBCs 12.4 on 2/11   Afebrile  Cont to monitor   LOS (Days) 14 A FACE TO FACE EVALUATION WAS PERFORMED  Cherie Lasalle Lorie Phenix 07/07/2017 9:32 AM

## 2017-07-08 ENCOUNTER — Inpatient Hospital Stay (HOSPITAL_COMMUNITY): Payer: Medicare Other | Admitting: Physical Therapy

## 2017-07-08 ENCOUNTER — Encounter (HOSPITAL_COMMUNITY): Payer: Medicare Other | Admitting: Occupational Therapy

## 2017-07-08 ENCOUNTER — Ambulatory Visit (HOSPITAL_COMMUNITY): Payer: Medicare Other | Admitting: Physical Therapy

## 2017-07-08 LAB — GLUCOSE, CAPILLARY
GLUCOSE-CAPILLARY: 59 mg/dL — AB (ref 65–99)
Glucose-Capillary: 167 mg/dL — ABNORMAL HIGH (ref 65–99)
Glucose-Capillary: 70 mg/dL (ref 65–99)
Glucose-Capillary: 190 mg/dL — ABNORMAL HIGH (ref 65–99)
Glucose-Capillary: 88 mg/dL (ref 65–99)

## 2017-07-08 MED ORDER — INSULIN GLARGINE 100 UNIT/ML ~~LOC~~ SOLN
18.0000 [IU] | Freq: Every day | SUBCUTANEOUS | Status: DC
  Administered 2017-07-08: 18 [IU] via SUBCUTANEOUS
  Filled 2017-07-08: qty 0.18

## 2017-07-08 MED ORDER — SODIUM CHLORIDE 0.9 % IV SOLN
2.0000 g | INTRAVENOUS | Status: DC
  Filled 2017-07-08: qty 2

## 2017-07-08 NOTE — Progress Notes (Signed)
Occupational Therapy Session Note  Patient Details  Name: [name] MRN: [id number] Date of Birth: 02/28/1976  Today's Date: 07/08/2017 OT Individual Time: 1100-1200 OT Individual Time Calculation (min): 60 min    Short Term Goals: Week 1:  OT Short Term Goal 1 (Week 1): Pt will tolerate sitting EOB for 10 min to engage in sitting bathing and dressing. OT Short Term Goal 1 - Progress (Week 1): Met OT Short Term Goal 2 (Week 1): Pt will be able to loop pants over feet with min A. OT Short Term Goal 2 - Progress (Week 1): Met OT Short Term Goal 3 (Week 1): Pt will be able to pull pants over hips with lateral leans with min A. OT Short Term Goal 3 - Progress (Week 1): Met OT Short Term Goal 4 (Week 1): Pt will tolerate standing in Sara lift for 2-3 minutes at a time to develop standing tolerance.  OT Short Term Goal 4 - Progress (Week 1): Progressing toward goal OT Short Term Goal 5 (Week 1): Pt will be able to tolerate sitting on a padded BSC for 5-10 min.  OT Short Term Goal 5 - Progress (Week 1): Progressing toward goal Week 2:  OT Short Term Goal 1 (Week 2): Pt will tolerate sitting on a padded BSC for 5- 10 min. OT Short Term Goal 2 (Week 2): Pt will be able to use lateral leans to pull pants up/down hips from sitting on a BSC. OT Short Term Goal 3 (Week 2): Pt will be able to tolerate standing in the Sara lift for 2 min to develop standing tolerance.       Skilled Therapeutic Interventions/Progress Updates:    Pt and family seen for family education with pt's spouse and daughter.   Pt received in w/c and discussed her progress and how family is feeling about her going home soon. They said they feel prepared with basic transfers and have been practicing them. They have also been involved assisting her with her self care. The session focused on toileting/ bathing options.  Pt stated the shower she took the other day was very uncomfortable and she does not want to do that again or any  time too soon. She was agreeable to trying a "dry run" transfer to tub bench in tub room.  Pt in power w/c and went to tub room, used board with min A to bench.  Her power w/c will not fit in BR at home but they do have a standard w/c she could use just for the transfers.  Pt still wants to wait awhile before doing this at home.  She stated she has not been toileting on seat much as her bowels come quickly and she does not have time to transfer.  Discussed with family toilet seat options.  She can not tolerate the standard or even wide BSC, she has to have a padded seat.  Found the tub benches on 2 online websites family could order from for pt to use as a toilet.  Pt worked on UE exercises with orange theraband for arm extensions and shoulder retractions.  Education on the importance of upper back exercises as pt does so much forward pushing with her transfers.   Family did not have any further questions at this time. Pt returned to room with her family.  Therapy Documentation Precautions:  Precautions Precautions: Fall Precaution Comments: wound vac from groin, sacral decubital wound  Restrictions Weight Bearing Restrictions: Yes RLE Weight Bearing: Non   weight bearing   Pain: R leg pain - pt premedicated   ADL: ADL ADL Comments: see functional naviagtor    See Function Navigator for Current Functional Status.   Therapy/Group: Individual Therapy  SAGUIER,JULIA 07/08/2017, 12:09 PM 

## 2017-07-08 NOTE — Significant Event (Signed)
Hypoglycemic Event  CBG: 59  Treatment: 15 GM carbohydrate snack  Symptoms: Nervous/irritable  Follow-up CBG: Time: 1205CBG Result:70  Possible Reasons for Event: Med regimin/meal   Comments/MD notified:    Margarito Liner

## 2017-07-08 NOTE — Progress Notes (Signed)
[facility] RE[facility]ABILITATION     PROGRESS NOTE  Subjective/Complaints:  Pt seen lying in bed this AM.  She slept well overnight.  She would like to know if her abx can be changed to PO.    ROS: Denies CP, SOB, nausea, vomiting, diarrhea.  Objective: Vital Signs: Blood pressure (!) 179/69, pulse 81, temperature 97.9 F (36.6 C), temperature source Oral, resp. rate 18, height 6\' 4"  (1.93 m), weight 80 kg (176 lb 5.9 oz), last menstrual period 10/09/2015, SpO2 95 %. No results found. Recent Labs    07/07/17 0519  WBC 12.4*  HGB 8.7*  HCT 29.4*  PLT 256   Recent Labs    07/07/17 0519  NA 137  K 4.7  CL 96*  GLUCOSE 135*  BUN 54*  CREATININE 5.93*  CALCIUM 7.9*   CBG (last 3)  Recent Labs    07/07/17 1926 07/07/17 2117 07/08/17 0636  GLUCAP 146* 204* 167*    Wt Readings from Last 3 Encounters:  07/07/17 80 kg (176 lb 5.9 oz)  06/23/17 76.8 kg (169 lb 6.4 oz)  06/11/17 83.5 kg (184 lb 1.3 oz)    Physical Exam:  BP (!) 179/69 (BP Location: Right Arm)   Pulse 81   Temp 97.9 F (36.6 C) (Oral)   Resp 18   Ht 6\' 4"  (1.93 m)   Wt 80 kg (176 lb 5.9 oz)   LMP 10/09/2015 Comment: pt on dialysis  SpO2 95%   BMI 21.47 kg/m  Constitutional: She has a sickly appearance. No distress.  HENT: Normocephalic and atraumatic.  Eyes:  and EOM are normal. No discharge.  Cardiovascular: RRR. No JVD. Respiratory: Effort normal and breath sounds normal.  GI: Bowel sounds are normal. She exhibits distension. Ascites, persistent.  Musculoskeletal: She exhibits mild edema.  Neurological: She is alert and oriented.  Motor: 4-/5 B/l UE, LLE, RLE HF (unchanged)  Skin: Skin is warm and dry. She is not diaphoretic.  Ischial Ulcer, not examined today BKA with dressing c/d/i Dusky left foot Psychiatric: Her speech is normal. Blunt.  Assessment/Plan: 1. Functional deficits secondary to Right BKA and wounds which require 3+ hours per day of interdisciplinary therapy  in a comprehensive inpatient rehab setting. Physiatrist is providing close team supervision and 24 hour management of active medical problems listed below. Physiatrist and rehab team continue to assess barriers to discharge/monitor patient progress toward functional and medical goals.  Function:  Bathing Bathing position   Position: Shower  Bathing parts Body parts bathed by patient: Right arm, Left arm, Chest, Abdomen, Front perineal area, Buttocks, Right upper leg, Left upper leg, Left lower leg, Back Body parts bathed by helper: Front perineal area, Buttocks, Right upper leg, Left upper leg, Right lower leg, Left lower leg  Bathing assist Assist Level: Touching or steadying assistance(Pt > 75%)      Upper Body Dressing/Undressing Upper body dressing   What is the patient wearing?: Pull over shirt/dress     Pull over shirt/dress - Perfomed by patient: Thread/unthread right sleeve, Thread/unthread left sleeve, Put head through opening, Pull shirt over trunk Pull over shirt/dress - Perfomed by helper: Pull shirt over trunk        Upper body assist Assist Level: Set up      Lower Body Dressing/Undressing Lower body dressing   What is the patient wearing?: Pants, Non-skid slipper socks     Pants- Performed by patient: Thread/unthread right pants leg, Thread/unthread left pants leg Pants- Performed by helper:  Pull pants up/down   Non-skid slipper socks- Performed by helper: Don/doff left sock   Socks - Performed by helper: Don/doff right sock, Don/doff left sock   Shoes - Performed by helper: Don/doff right shoe, Don/doff left shoe, Fasten right, Fasten left          Lower body assist Assist for lower body dressing: Set up      Toileting Toileting     Toileting steps completed by helper: Adjust clothing prior to toileting, Performs perineal hygiene, Adjust clothing after toileting    Toileting assist     Transfers Chair/bed transfer   Chair/bed transfer method:  Lateral scoot Chair/bed transfer assist level: Supervision or verbal cues Chair/bed transfer assistive device: Armrests, Sliding board Mechanical lift: Maximove   Locomotion Ambulation Ambulation activity did not occur: Safety/medical Editor, commissioning activity did not occur: (pt in TIS at this time) Type: Motorized Max wheelchair distance: 150 Assist Level: Supervision or verbal cues  Cognition Comprehension Comprehension assist level: Follows complex conversation/direction with extra time/assistive device  Expression Expression assist level: Expresses complex ideas: With extra time/assistive device  Social Interaction Social Interaction assist level: Interacts appropriately with others with medication or extra time (anti-anxiety, antidepressant).  Problem Solving Problem solving assist level: Solves complex problems: With extra time  Memory Memory assist level: More than reasonable amount of time    Medical Problem List and Plan: 1.  Deficits with mobility, transfers, self-care secondary to Right BKA and wounds.   Continue CIR   2.  DVT Prophylaxis/Anticoagulation: Pharmaceutical: Lovenox 3. Pain Management:    Fentanyl 25 mcg patch    IV dilaudid transitioned to oxycodone prn.     Gabapentin increased to 100 twice a day on 2/6 4. Mood: Offer Ego support.  5. Neuropsych: This patient is capable of making decisions on her own behalf. 6. Skin/Wound Care: has evidence of malnutrition. Has been refusing nephro as well as protein supplements. Encourage intake to help promote wound healing as well as issues with ascites.    Ischial  Decub: Air mattress overlay for pressure relief measures. Added protein supplement for malnutrition. Evaluated by PRS, not a flap candidate at present, follow up in wound clinic           Right groin wound 7. Fluids/Electrolytes/Nutrition: Renal diet with 1200 cc FR. Monitor daily weights.  8. Left Ischium decub with osteo: Daily  local care with saline dressing. Need to encourage intake of supplements to promote wound healing.     Per ID, Vanc/Ceftazidime x6 weeks after discussion   Vanc level within normal limits on 2/6 9. T2DM: Monitor BS ac/hs.    Continue Lantus 20 units in the a.m.   Lantus 14 units at bedtime and increase to 19 units on 2/1, increased further over the weekend, decreased to 20 units on 2/4, decreased to 18 units on 2/12   SSI for elevated BS.    CBG (last 3)  Recent Labs    07/07/17 1926 07/07/17 2117 07/08/17 0636  GLUCAP 146* 204* 167*    Hypoglycemia yesterday 10. HTN: Monitor BP bid. Continue isosorbide bid, labetalol tid, clonidine tid and norvasc daily.  Vitals:   07/07/17 2135 07/08/17 0450  BP: (!) 155/90 (!) 179/69  Pulse: 77 81  Resp: 18 18  Temp: 98.4 F (36.9 C) 97.9 F (36.6 C)  SpO2: 98% 95%     Hydralazine 10 3 times a day started on 1/30, increased to 25 on  1/31, increased to 50 on 2/4, increased to 100 on 2/6   Hypertensive crisis overnight again, remains uncontrolled, however, improved from baseline after discussion with Nephro, remains at risk for further end organ damage 11. Pruritis: Question due to rise in phosphorous levels. Sarna tid with benadryl prn.  12. Ascites: Monitor weight daily.  Filed Weights   07/07/17 0323 07/07/17 1430 07/07/17 1835  Weight: 82 kg (180 lb 12.4 oz) 82 kg (180 lb 12.4 oz) 80 kg (176 lb 5.9 oz)    Incomplete tap on 1/29, removing 2 L, removed 7.9 L on 2/6, appreciate VIR   Fluid analysis remains pending 2/12   Plan for repeat tomorrow  13. ESRD   Recs per Nephro- appreciate Nephro note 14. Acute on chronic anemia  Hb 8.7 on 2/11   Cont to monitor 15. Leukocytosis, persistent   WBCs 12.4 on 2/11   Afebrile  Cont to monitor   LOS (Days) 15 A FACE TO FACE EVALUATION WAS PERFORMED  Ankit Lorie Phenix 07/08/2017 9:05 AM

## 2017-07-08 NOTE — Progress Notes (Signed)
Patient refused her heparin last night. Risks and benefits explained. Patient stated" I take it twice a day." Communication left for the physician.

## 2017-07-08 NOTE — Progress Notes (Signed)
Recreational Therapy Session Note  Patient Details  Name: [name] MRN: [id number] Date of Birth: 06-03-1975 Today's Date: 07/08/2017  Pain: no c/o Skilled Therapeutic Interventions/Progress Updates: Attempted follow up session in between scheduled therapy sessions. Pt resting in bed and requesting to sleep prior to next session. Discussed and reviewed leisure opportunities for participation at home and at discharge.   [place] 07/08/2017, 3:57 PM

## 2017-07-08 NOTE — Progress Notes (Signed)
Physical Therapy Session Note  Patient Details  Name: [name] MRN: [id number] Date of Birth: [birth year]  Today's Date: 07/08/2017 PT Individual Time: 1415-1440 PT Individual Time Calculation (min): 25 min   Short Term Goals: Week 2:  PT Short Term Goal 1 (Week 2): =LTG  Skilled Therapeutic Interventions/Progress Updates:   Pt supine and asleep, required max stimulation to wake up but agreeable to therapy. Focused on w/c management this session utilizing motorized w/c. Transferred to w/c via slide board transfer w/ set-up assist only, pt placed board w/ supervision and verbalized her needs appropriately. Performed community distances w/ w/c, verbal cues for precautions/safety occasionally, and negotiated tight spaces and turns w/ supervision. Pt self-selected speed of w/c appropriately and safely w/o verbal cues. Ended session in w/c, call bell within reach and all needs met.   Therapy Documentation Precautions:  Precautions Precautions: Fall Precaution Comments: wound vac from groin, sacral decubital wound  Restrictions Weight Bearing Restrictions: Yes RLE Weight Bearing: Non weight bearing Vital Signs: Therapy Vitals Temp: 98.1 F (36.7 C) Temp Source: Oral Pulse Rate: 73 Resp: 18 BP: (!) 142/58 Patient Position (if appropriate): Lying Oxygen Therapy SpO2: 100 % O2 Device: Not Delivered Pain: Pain Assessment Pain Assessment: Faces Pain Score: 5  Pain Location: Back Pain Descriptors / Indicators: Aching Patients Stated Pain Goal: 4 Pain Intervention(s): Medication (See eMAR)  See Function Navigator for Current Functional Status.   Therapy/Group: Individual Therapy  Rea Reser K Arnette 07/08/2017, 4:54 PM

## 2017-07-08 NOTE — Progress Notes (Signed)
[facility] ROUNDING NOTE   Subjective:   Interval History: This is a 42 year old lady end stage renal disease that dialyzes MWF  She was admtted from [facility] 1/16 after falling CT head revealed hyperdense focus in left posterior parietal region suspicious for small hemorrhagic mass, reviewed   she had a very prolonged hospitalization in November and December with a  sacral decub and RLE ischemia requiring right metatarsal amputation with exploration of right groin wound 04/2017.  CT abdomen/pelvis done due to stage IV sacral decub and wound drainage and was positive for sacral osteomyelitis. 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 Trula Slade She continues to have VAC to right groin. She has difficulty sitting for HD due to sacral pain with question of ability to tolerate outpatient HD.           Objective:  Vital signs in last 24 hours:  Temp:  [97.1 F (36.2 C)-98.4 F (36.9 C)] 98.1 F (36.7 C) (02/12 1326) Pulse Rate:  [67-81] 73 (02/12 1326) Resp:  [18] 18 (02/12 1326) BP: (142-193)/(58-92) 142/58 (02/12 1326) SpO2:  [95 %-100 %] 100 % (02/12 1326) Weight:  [176 lb 5.9 oz (80 kg)-180 lb 12.4 oz (82 kg)] 176 lb 5.9 oz (80 kg) (02/11 1835)  Weight change: 0 lb (0 kg) Filed Weights   07/07/17 0323 07/07/17 1430 07/07/17 1835  Weight: 180 lb 12.4 oz (82 kg) 180 lb 12.4 oz (82 kg) 176 lb 5.9 oz (80 kg)    Intake/Output: I/O last 3 completed shifts: In: 2505 [P.O.:1090; I.V.:50; NG/GT:150; IV Piggyback:250] Out: 2500 [Other:2500]   Intake/Output this shift:  No intake/output data recorded.  awake and alert Regular S1S2 No S3 Lungs clear Abd tight but not taut(ascites) + BS. Not  tender R groinwound not examined R BKA stump with 1+ edema shrinker onand in immobilizer No LLE edema Sacral wounds coverednot examined Muscle wasting throughout Dialysis Access: left AVF + bruit      Basic Metabolic Panel: Recent Labs  Lab 07/02/17 1325 07/04/17 1331 07/07/17 0519  NA 135 135 137  K 4.7 4.8 4.7  CL 95* 94* 96*  CO2 24 26 25   GLUCOSE 228* 96 135*  BUN 51* 44* 54*  CREATININE 5.48* 4.97* 5.93*  CALCIUM 7.7* 7.9* 7.9*  PHOS 3.6 4.4  --     Liver Function Tests: Recent Labs  Lab 07/02/17 1325 07/04/17 1331  ALBUMIN 1.6* 1.7*   No results for input(s): LIPASE, AMYLASE in the last 168 hours. No results for input(s): AMMONIA in the last 168 hours.  CBC: Recent Labs  Lab 07/02/17 1325 07/04/17 1331 07/07/17 0519  WBC 12.3* 12.5* 12.4*  HGB 8.1* 8.5* 8.7*  HCT 27.3* 28.0* 29.4*  MCV 91.6 91.5 93.0  PLT 300 262 256    Cardiac Enzymes: No results for input(s): CKTOTAL, CKMB, CKMBINDEX, TROPONINI in the last 168 hours.  BNP: Invalid input(s): POCBNP  CBG: Recent Labs  Lab 07/07/17 1926 07/07/17 2117 07/08/17 0636 07/08/17 1142 07/08/17 1202  GLUCAP 146* 204* 167* 20* 76    Microbiology: Results for orders placed or performed during the hospital encounter of 06/23/17  Fungus Culture With Stain     Status: None   Collection Time: 06/24/17  3:39 PM  Result Value Ref Range Status   Fungus Stain Final report  Final    Comment: (NOTE) Performed At: [facility] [address], [place] [id number] Rush Farmer MD [id number]    Fungus (Mycology) Culture PENDING  Incomplete   Fungal Source FLUID  Corrected    Comment: PERITONEAL CORRECTED ON 01/30 AT 0044: PREVIOUSLY REPORTED AS BLOOD   Culture, body fluid-bottle     Status: None   Collection Time: 06/24/17  3:39 PM  Result Value Ref Range Status   Specimen Description FLUID PERITONEAL  Final   Special Requests BOTTLES DRAWN AEROBIC AND ANAEROBIC 10CC  Final    Culture   Final    NO GROWTH 5 DAYS Performed at [facility], [place] [address], [place], [place] [address]    Report Status 06/30/2017 FINAL  Final  Gram stain     Status: None   Collection Time: 06/24/17  3:39 PM  Result Value Ref Range Status   Specimen Description FLUID PERITONEAL  Final   Special Requests NONE  Final   Gram Stain   Final    RARE WBC PRESENT, PREDOMINANTLY MONONUCLEAR NO ORGANISMS SEEN    Report Status 06/25/2017 FINAL  Final  Fungus Culture Result     Status: None   Collection Time: 06/24/17  3:39 PM  Result Value Ref Range Status   Result 1 Comment  Final    Comment: (NOTE) KOH/Calcofluor preparation:  no fungus observed. Performed At: [facility] [place], [place] [id number] Rush Farmer MD QQ:[id number]   Fungus Culture With Stain     Status: None (Preliminary result)   Collection Time: 07/01/17  3:18 PM  Result Value Ref Range Status   Fungus Stain Final report  Final    Comment: (NOTE) Performed At: [facility] [place], [place] [id number] Rush Farmer MD [id number]    Fungus (Mycology) Culture PENDING  Incomplete   Fungal Source FLUID  Final    Comment: PERITONEAL  Culture, body fluid-bottle     Status: None   Collection Time: 07/01/17  3:18 PM  Result Value Ref Range Status   Specimen Description FLUID PERITONEAL  Final   Special Requests NONE  Final   Culture   Final    NO GROWTH 5 DAYS Performed at [facility], [address], [place], [place] [address]    Report Status 07/07/2017 FINAL  Final  Gram stain     Status: None   Collection Time: 07/01/17  3:18 PM  Result Value Ref Range Status   Specimen Description PERITONEAL FLUID  Final   Special Requests NONE  Final   Gram Stain NO WBC SEEN NO ORGANISMS SEEN   Final   Report Status 07/02/2017 FINAL  Final  Fungus Culture Result     Status: None   Collection Time: 07/01/17  3:18 PM  Result Value Ref Range Status    Result 1 Comment  Final    Comment: (NOTE) KOH/Calcofluor preparation:  no fungus observed. Performed At: [facility] [place], [place] [id number] Rush Farmer MD [id number]     Coagulation Studies: No results for input(s): LABPROT, INR in the last 72 hours.  Urinalysis: No results for input(s): COLORURINE, LABSPEC, PHURINE, GLUCOSEU, HGBUR, BILIRUBINUR, KETONESUR, PROTEINUR, UROBILINOGEN, NITRITE, LEUKOCYTESUR in the last 72 hours.  Invalid input(s): APPERANCEUR    Imaging:  No results found.   Medications:   . sodium chloride    . sodium chloride    . cefTAZidime (FORTAZ)  IV 1 g (07/08/17 1343)  . vancomycin 750 mg (07/07/17 1700)   . amLODipine  10 mg Oral QHS  . atorvastatin  40 mg Oral q1800  . cinacalcet  60 mg Oral Q supper  . cloNIDine  0.3 mg Oral TID  . darbepoetin (ARANESP) injection - DIALYSIS  200 mcg Intravenous Q Fri-HD  . docusate sodium  100 mg Oral TID  . fentaNYL  25 mcg Transdermal Q72H  . ferric citrate  420 mg Oral TID WC  . gabapentin  100 mg Oral BID  . heparin  5,000 Units Subcutaneous Q8H  . hydrALAZINE  100 mg Oral Q8H  . insulin aspart  0-9 Units Subcutaneous TID WC  . insulin glargine  18 Units Subcutaneous QHS  . insulin glargine  20 Units Subcutaneous Daily  . isosorbide dinitrate  60 mg Oral BID  . labetalol  300 mg Oral TID  . metoCLOPramide  2.5 mg Oral TID AC  . multivitamin  1 tablet Oral QHS  . pantoprazole  40 mg Oral Daily  . protein supplement  1 scoop Oral TID WC  . sertraline  50 mg Oral Daily  . sevelamer carbonate  2,400 mg Oral TID WC   sodium chloride, sodium chloride, acetaminophen, alteplase, aluminum hydroxide, bisacodyl, diphenhydrAMINE, guaiFENesin-dextromethorphan, heparin, lidocaine (PF), lidocaine-prilocaine, LORazepam, oxyCODONE, pentafluoroprop-tetrafluoroeth, phenol, polyethylene glycol, prochlorperazine **OR** prochlorperazine **OR** prochlorperazine, traMADol,  traZODone  Assessment/ Plan:  Dialysis: MWF [facility] 4h 30min  85.5kg 2/2.25  L AVF  No heparin for now due to small IC bleed by CT 1/16 (was on Hep 2000)  -mircera 200 every 2 wks      Impression: 1  Sacral decubdifficulty sitting for HD due to sacral pain with question of ability to tolerate outpatient HD. CIR recommended due to functional deficits., not a flap candidate at present, follow up in wound clinic 2  BKA 3  R groin wound  Vanc/Ceftazidime x6 weeks after discussion 4  ESRD HD mwf, Plan dialysis  5  Fall / left parietal bleed, small 6  Ascites - periodic paracentesis (7.9 L para last wk 2/5) 7  Anemia ckd, darbe 200/ wk 8  HTN chron/ severe, normal SBP is 160 - 200, cont mult BP meds, avoid SBP < 140 w HD if possible 9  Volume wt's down after tap last wk, below dry 10  CAD 11  DM2     LOS: 15 [name] W @TODAY @2 :14 PM

## 2017-07-08 NOTE — Progress Notes (Signed)
Pharmacy Antibiotic Note  [name] is a 42 y.o. female with recent complicated hospital course 03/2017 discharged to SNF 1/16, but readmitted on the same day after fall out of bed and had possible small hemorrhagic mass on CT. She was being treated with vanc/zosyn for sacral osteo (1/17-1/23). And s/p RBKA on 1/22, and transferred to CIR on 1/28.   She had a left ischial ulcer for several months now and recent CT scans are consistent with osteomyelitis. Pharmacy was consulted to dose vancomycin/ceftazidime per Dr. Johnnye Sima recommendation, for osteomyelitis. Plan to treat for 6 weeks. She is afebrile, WBC 12.4 (stable), current weight 80 kg (some variations on weight 70-80kg). Repeat peritoneal fluid culture from 2/5 was negative. HD MWF on schedule, last HD done on 2/11. Has been tolerating 4 hr sessions at BFR 400. Last vancomycin level within range (17) on 2/6.    Plan: Goal Pre-HD level 15-25 mcg/mL Continue Vancomycin 750 mg IV Q HD --Vancomycin level once weekly or as indicated. Will check vanc level tomorrow pre HD. Continue Ceftazidime 1g IV Q 24 hrs F/u HD schedule, disposition plan (possible discharge late next week?) --will need OPAT for 6 weeks of antibiotics at discharge  Height: 6\' 4"  (193 cm) Weight: 176 lb 5.9 oz (80 kg) IBW/kg (Calculated) : 82.3  Temp (24hrs), Avg:97.9 F (36.6 C), Min:97.1 F (36.2 C), Max:98.4 F (36.9 C)  Recent Labs  Lab 07/02/17 0701 07/02/17 1325 07/04/17 1331 07/07/17 0519  WBC  --  12.3* 12.5* 12.4*  CREATININE  --  5.48* 4.97* 5.93*  VANCORANDOM 17  --   --   --     Estimated Creatinine Clearance: 15.8 mL/min (A) (by C-G formula based on SCr of 5.93 mg/dL (H)).    Allergies  Allergen Reactions  . Cephalexin Other (See Comments)    Reaction unknown- Childhood allergy Tolerated Ceftriaxone in the past  . Sulfamethoxazole-Trimethoprim Other (See Comments)    Unknown reaction. Pt states that she was told by her mother that she  had allergy to Bactrim as a child.    Antimicrobials this admission: Fortaz 1/29 >>(thru 08/04/17) Vancomycin 1/17>>1/23, 1/29 >>(thru 08/04/17) Zosyn 1/17>>1/23  Microbiology results: 1/17 BCx: neg 1/17 Peritoneal fluid: neg 1/17 MRSA PCR: neg 1/21 surgical PCR: MRSA neg, s.aureus pos 1/29 peritoneal fluid - neg 2/5 peritoneal fluid -neg 2/5 fungal cx - ngtd   Thank you for allowing [place] to participate in this patients care.  Nicole Cella, RPh Clinical Pharmacist Clinical phone for 07/08/2017 from 7a-3:30p: [phone] If after 3:30p, please call main pharmacy at: [phone] 07/08/2017 1:04 PM

## 2017-07-08 NOTE — Progress Notes (Signed)
Patient evaluated during PT treatment.  Right BKA dressing not removed due to therapy.  We will come back and look at amputation site prior to discharge on Thursday.   [name]

## 2017-07-08 NOTE — Progress Notes (Signed)
Physical Therapy Note  Patient Details  Name: [name] MRN: [id number] Date of Birth: [birth year] Today's Date: 07/08/2017    Time: 1000-1054 54 minutes  1:1 no c/o pain.  Pt performs power w/c mobility in community environments including elevators, outdoors and crowded areas with supervision, cues to slow speed when navigating tight spaces.  Pt/husband perform car transfer to husbands car with pt able to place sliding board and husband able to provide min A.  Continued transfer training to a variety of heights of surfaces with supervision.  Sit to stand in parallel bars with max A x 3 attempts.     Kylin Genna 07/08/2017, 10:57 AM

## 2017-07-09 ENCOUNTER — Inpatient Hospital Stay (HOSPITAL_COMMUNITY): Payer: Medicare Other | Admitting: Occupational Therapy

## 2017-07-09 ENCOUNTER — Inpatient Hospital Stay (HOSPITAL_COMMUNITY): Payer: Medicare Other | Admitting: Physical Therapy

## 2017-07-09 LAB — RENAL FUNCTION PANEL
Albumin: 1.7 g/dL — ABNORMAL LOW (ref 3.5–5.0)
Anion gap: 14 (ref 5–15)
BUN: 47 mg/dL — ABNORMAL HIGH (ref 6–20)
CALCIUM: 8.1 mg/dL — AB (ref 8.9–10.3)
CO2: 26 mmol/L (ref 22–32)
CREATININE: 5.17 mg/dL — AB (ref 0.44–1.00)
Chloride: 95 mmol/L — ABNORMAL LOW (ref 101–111)
GFR, EST AFRICAN AMERICAN: 11 mL/min — AB (ref >60)
GFR, EST NON AFRICAN AMERICAN: 9 mL/min — AB (ref >60)
Glucose, Bld: 131 mg/dL — ABNORMAL HIGH (ref 65–99)
Phosphorus: 2.6 mg/dL (ref 2.5–4.6)
Potassium: 4.8 mmol/L (ref 3.5–5.1)
SODIUM: 135 mmol/L (ref 135–145)

## 2017-07-09 LAB — GLUCOSE, CAPILLARY
GLUCOSE-CAPILLARY: 41 mg/dL — AB (ref 65–99)
GLUCOSE-CAPILLARY: 62 mg/dL — AB (ref 65–99)
GLUCOSE-CAPILLARY: 113 mg/dL — AB (ref 65–99)
GLUCOSE-CAPILLARY: 153 mg/dL — AB (ref 65–99)
Glucose-Capillary: 54 mg/dL — ABNORMAL LOW (ref 65–99)
Glucose-Capillary: 71 mg/dL (ref 65–99)

## 2017-07-09 LAB — CBC
HCT: 30.1 % — ABNORMAL LOW (ref 36.0–46.0)
HEMOGLOBIN: 9.2 g/dL — AB (ref 12.0–15.0)
MCH: 28.4 pg (ref 26.0–34.0)
MCHC: 30.6 g/dL (ref 30.0–36.0)
MCV: 92.9 fL (ref 78.0–100.0)
PLATELETS: 265 10*3/uL (ref 150–400)
RBC: 3.24 MIL/uL — AB (ref 3.87–5.11)
RDW: 18.7 % — ABNORMAL HIGH (ref 11.5–15.5)
WBC: 11.6 10*3/uL — AB (ref 4.0–10.5)

## 2017-07-09 LAB — VANCOMYCIN, RANDOM: Vancomycin Rm: 11

## 2017-07-09 MED ORDER — INSULIN GLARGINE 100 UNIT/ML ~~LOC~~ SOLN
10.0000 [IU] | Freq: Every day | SUBCUTANEOUS | Status: DC
  Administered 2017-07-09: 10 [IU] via SUBCUTANEOUS
  Filled 2017-07-09: qty 0.1

## 2017-07-09 MED ORDER — OXYCODONE HCL 5 MG PO TABS
ORAL_TABLET | ORAL | Status: AC
  Filled 2017-07-09: qty 2

## 2017-07-09 MED ORDER — FENTANYL 12 MCG/HR TD PT72
12.5000 ug | MEDICATED_PATCH | TRANSDERMAL | Status: DC
  Administered 2017-07-09: 12.5 ug via TRANSDERMAL
  Filled 2017-07-09: qty 1

## 2017-07-09 MED ORDER — VANCOMYCIN HCL IN DEXTROSE 1-5 GM/200ML-% IV SOLN: 1000.0000 mg | INTRAVENOUS | Status: DC

## 2017-07-09 NOTE — Progress Notes (Addendum)
[place] PHYSICAL MEDICINE & REHABILITATION     PROGRESS NOTE  Subjective/Complaints:  Patient seen lying in bed this morning. She states she slept well overnight. Discuss plan for paracentesis with patient today.  ROS: Denies CP, SOB, nausea, vomiting, diarrhea.  Objective: Vital Signs: Blood pressure (!) 144/64, pulse 69, temperature 97.6 F (36.4 C), temperature source Axillary, resp. rate 16, height 6\' 4"  (1.93 m), weight 81 kg (178 lb 9.2 oz), last menstrual period 10/09/2015, SpO2 100 %. No results found. Recent Labs    07/07/17 0519  WBC 12.4*  HGB 8.7*  HCT 29.4*  PLT 256   Recent Labs    07/07/17 0519  NA 137  K 4.7  CL 96*  GLUCOSE 135*  BUN 54*  CREATININE 5.93*  CALCIUM 7.9*   CBG (last 3)  Recent Labs    07/09/17 0556 07/09/17 0631 07/09/17 0703  GLUCAP 41* 54* 62*    Wt Readings from Last 3 Encounters:  07/09/17 81 kg (178 lb 9.2 oz)  06/23/17 76.8 kg (169 lb 6.4 oz)  06/11/17 83.5 kg (184 lb 1.3 oz)    Physical Exam:  BP (!) 144/64 (BP Location: Right Arm)   Pulse 69   Temp 97.6 F (36.4 C) (Axillary)   Resp 16   Ht 6\' 4"  (1.93 m)   Wt 81 kg (178 lb 9.2 oz)   LMP 10/09/2015 Comment: pt on dialysis  SpO2 100%   BMI 21.74 kg/m  Constitutional: She has a sickly appearance. No distress.  HENT: Normocephalic and atraumatic.  Eyes:  and EOM are normal. No discharge.  Cardiovascular: RRR. No JVD. Respiratory: Effort normal and breath sounds normal.  GI: Bowel sounds are normal. She exhibits distension. Ascites, persistent.  Musculoskeletal: She exhibits mild edema.  Neurological: She is alert and oriented.  Motor: 4--4/5 B/l UE, LLE, RLE HF  Skin: Skin is warm and dry. She is not diaphoretic.  Ischial Ulcer, not examined today BKA with dressing c/d/i Dusky left foot Psychiatric: Her speech is normal. Blunt.  Assessment/Plan: 1. Functional deficits secondary to Right BKA and wounds which require 3+ hours per day of interdisciplinary  therapy in a comprehensive inpatient rehab setting. Physiatrist is providing close team supervision and 24 hour management of active medical problems listed below. Physiatrist and rehab team continue to assess barriers to discharge/monitor patient progress toward functional and medical goals.  Function:  Bathing Bathing position   Position: Shower  Bathing parts Body parts bathed by patient: Right arm, Left arm, Chest, Abdomen, Front perineal area, Buttocks, Right upper leg, Left upper leg, Left lower leg, Back Body parts bathed by helper: Front perineal area, Buttocks, Right upper leg, Left upper leg, Right lower leg, Left lower leg  Bathing assist Assist Level: Touching or steadying assistance(Pt > 75%)      Upper Body Dressing/Undressing Upper body dressing   What is the patient wearing?: Pull over shirt/dress     Pull over shirt/dress - Perfomed by patient: Thread/unthread right sleeve, Thread/unthread left sleeve, Put head through opening, Pull shirt over trunk Pull over shirt/dress - Perfomed by helper: Pull shirt over trunk        Upper body assist Assist Level: Set up      Lower Body Dressing/Undressing Lower body dressing   What is the patient wearing?: Pants, Non-skid slipper socks     Pants- Performed by patient: Thread/unthread right pants leg, Thread/unthread left pants leg Pants- Performed by helper: Pull pants up/down   Non-skid slipper socks- Performed  by helper: Don/doff left sock   Socks - Performed by helper: Don/doff right sock, Don/doff left sock   Shoes - Performed by helper: Don/doff right shoe, Don/doff left shoe, Fasten right, Fasten left          Lower body assist Assist for lower body dressing: Set up      Toileting Toileting     Toileting steps completed by helper: Adjust clothing prior to toileting, Performs perineal hygiene, Adjust clothing after toileting    Toileting assist     Transfers Chair/bed transfer   Chair/bed transfer  method: Lateral scoot Chair/bed transfer assist level: Supervision or verbal cues Chair/bed transfer assistive device: Armrests, Sliding board Mechanical lift: Maximove   Locomotion Ambulation Ambulation activity did not occur: Safety/medical concerns         Wheelchair Wheelchair activity did not occur: (pt in TIS at this time) Type: Motorized Max wheelchair distance: >150' Assist Level: Supervision or verbal cues  Cognition Comprehension Comprehension assist level: Follows complex conversation/direction with extra time/assistive device  Expression Expression assist level: Expresses complex ideas: With extra time/assistive device  Social Interaction Social Interaction assist level: Interacts appropriately with others with medication or extra time (anti-anxiety, antidepressant).  Problem Solving Problem solving assist level: Solves complex problems: With extra time  Memory Memory assist level: More than reasonable amount of time    Medical Problem List and Plan: 1.  Deficits with mobility, transfers, self-care secondary to Right BKA and wounds.   Continue CIR     I saw the patient for a face to face evaluation for power chair. I have reviewed and agree with the physical therapy evaluation. She will need a power chair for mobility, pressure relief, transfers, endurance, self-care. 2.  DVT Prophylaxis/Anticoagulation: Pharmaceutical: Lovenox 3. Pain Management:    Fentanyl 25 mcg patch    IV dilaudid transitioned to oxycodone prn.     Gabapentin increased to 100 twice a day on 2/6 with improvement 4. Mood: Offer Ego support.  5. Neuropsych: This patient is capable of making decisions on her own behalf. 6. Skin/Wound Care: has evidence of malnutrition. Has been refusing nephro as well as protein supplements. Encourage intake to help promote wound healing as well as issues with ascites.    Ischial  Decub: Air mattress overlay for pressure relief measures. Added protein supplement for  malnutrition. Evaluated by PRS, not a flap candidate at present, follow up in wound clinic           Right groin wound   BKA stump to be evaluated by vascular prior to discharge, appreciate recs. 7. Fluids/Electrolytes/Nutrition: Renal diet with 1200 cc FR. Monitor daily weights.  8. Left Ischium decub with osteo: Daily local care with saline dressing. Need to encourage intake of supplements to promote wound healing.     Per ID, Vanc/Ceftazidime x6 weeks after discussion   Vanc level within normal limits on 2/6 9. T2DM: Monitor BS ac/hs.    Continue Lantus 20 units in the a.m.   Lantus 14 units at bedtime and increase to 19 units on 2/1, increased further over the weekend, decreased to 20 units on 2/4, decreased to 18 units on 2/12, decreased to 10 on 2/13   SSI for elevated BS.    CBG (last 3)  Recent Labs    07/09/17 0556 07/09/17 0631 07/09/17 0703  GLUCAP 41* 54* 62*    Hypoglycemia again yesterday 10. HTN: Monitor BP bid. Continue isosorbide bid, labetalol tid, clonidine tid and norvasc daily.  Vitals:  07/09/17 0500 07/09/17 0801  BP: (!) 161/66 (!) 144/64  Pulse: 71 69  Resp: 18 16  Temp: 97.6 F (36.4 C)   SpO2: 100% 100%     Hydralazine 10 3 times a day started on 1/30, increased to 25 on 1/31, increased to 50 on 2/4, increased to 100 on 2/6   Improved control on 2/13 11. Pruritis: Question due to rise in phosphorous levels. Sarna tid with benadryl prn.  12. Ascites: Monitor weight daily.  Filed Weights   07/07/17 1430 07/07/17 1835 07/09/17 0500  Weight: 82 kg (180 lb 12.4 oz) 80 kg (176 lb 5.9 oz) 81 kg (178 lb 9.2 oz)    Incomplete tap on 1/29, removing 2 L, removed 7.9 L on 2/6, appreciate VIR   Fluid analysis remains pending 2/13   Plan for paracentesis today 13. ESRD   Recs per Nephro- appreciate Nephro note 14. Acute on chronic anemia  Hb 8.7 on 2/11   Cont to monitor 15. Leukocytosis, persistent   WBCs 12.4 on 2/11   Afebrile  Cont to monitor 16.  ?Brain mass   Follow-up as an outpatient  LOS (Days) 16 A FACE TO FACE EVALUATION WAS PERFORMED  Ankit Lorie Phenix 07/09/2017 8:35 AM

## 2017-07-09 NOTE — Progress Notes (Signed)
[name] ROUNDING NOTE   Subjective:    Interval History: This is a 42 year old lady end stage renal disease that dialyzes MWF  She was admtted from [facility] 1/16 after falling CT head revealed hyperdense focus in left posterior parietal region suspicious for small hemorrhagic mass, reviewed   she had a very prolonged hospitalization in November and December with a  sacral decub and RLE ischemia requiring right metatarsal amputation with exploration of right groin wound 04/2017.  CT abdomen/pelvis done due to stage IV sacral decub and wound drainage and was positive for sacral osteomyelitis. 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 Trula Slade She continues to have VAC to right groin. She has difficulty sitting for HD due to sacral pain with question of ability to tolerate outpatient HD.   Objective:  Vital signs in last 24 hours:  Temp:  [97.6 F (36.4 C)-98.1 F (36.7 C)] 97.6 F (36.4 C) (02/13 0500) Pulse Rate:  [66-84] 69 (02/13 0801) Resp:  [16-20] 16 (02/13 0801) BP: (142-163)/(57-75) 144/64 (02/13 0801) SpO2:  [98 %-100 %] 100 % (02/13 0801) Weight:  [178 lb 9.2 oz (81 kg)] 178 lb 9.2 oz (81 kg) (02/13 0500)  Weight change: -3.3 oz (-1 kg) Filed Weights   07/07/17 1430 07/07/17 1835 07/09/17 0500  Weight: 180 lb 12.4 oz (82 kg) 176 lb 5.9 oz (80 kg) 178 lb 9.2 oz (81 kg)    Intake/Output: I/O last 3 completed shifts: In: 12 [P.O.:930; NG/GT:50] Out: 1 [Stool:1]   Intake/Output this shift:  Total I/O In: 120 [P.O.:120] Out: -   awake and alert Regular S1S2 No S3 Lungs clear Abd tight but not taut(ascites) + BS. Not tender R groinwound not examined R BKA stump with 1+  edema shrinker onand in immobilizer No LLE edema Sacral wounds coverednot examined Muscle wasting throughout Dialysis Access: left AVF + bruit      Basic Metabolic Panel: Recent Labs  Lab 07/02/17 1325 07/04/17 1331 07/07/17 0519  NA 135 135 137  K 4.7 4.8 4.7  CL 95* 94* 96*  CO2 24 26 25   GLUCOSE 228* 96 135*  BUN 51* 44* 54*  CREATININE 5.48* 4.97* 5.93*  CALCIUM 7.7* 7.9* 7.9*  PHOS 3.6 4.4  --     Liver Function Tests: Recent Labs  Lab 07/02/17 1325 07/04/17 1331  ALBUMIN 1.6* 1.7*   No results for input(s): LIPASE, AMYLASE in the last 168 hours. No results for input(s): AMMONIA in the last 168 hours.  CBC: Recent Labs  Lab 07/02/17 1325 07/04/17 1331 07/07/17 0519  WBC 12.3* 12.5* 12.4*  HGB 8.1* 8.5* 8.7*  HCT 27.3* 28.0* 29.4*  MCV 91.6 91.5 93.0  PLT 300 262 256    Cardiac Enzymes: No results for input(s): CKTOTAL, CKMB, CKMBINDEX, TROPONINI in the last 168 hours.  BNP: Invalid input(s): POCBNP  CBG: Recent Labs  Lab 07/08/17 1623 07/08/17 2052 07/09/17 0556 07/09/17 0631 07/09/17 0703  GLUCAP 190* 88 41* 30* 18*    Microbiology: Results for orders placed or performed during the hospital encounter of 06/23/17  Fungus Culture With Stain     Status: None   Collection Time: 06/24/17  3:39 PM  Result Value Ref  Range Status   Fungus Stain Final report  Final    Comment: (NOTE) Performed At: [facility] [address], [place] [id number] Rush Farmer MD [id number]    Fungus (Mycology) Culture PENDING  Incomplete   Fungal Source FLUID  Corrected    Comment: PERITONEAL CORRECTED ON 01/30 AT 0044: PREVIOUSLY REPORTED AS BLOOD   Culture, body fluid-bottle     Status: None   Collection Time: 06/24/17  3:39 PM  Result Value Ref Range Status   Specimen Description FLUID PERITONEAL  Final   Special Requests BOTTLES DRAWN AEROBIC AND ANAEROBIC 10CC  Final   Culture   Final    NO GROWTH 5 DAYS Performed at  [facility], [place] [address], [place], [place] [address]    Report Status 06/30/2017 FINAL  Final  Gram stain     Status: None   Collection Time: 06/24/17  3:39 PM  Result Value Ref Range Status   Specimen Description FLUID PERITONEAL  Final   Special Requests NONE  Final   Gram Stain   Final    RARE WBC PRESENT, PREDOMINANTLY MONONUCLEAR NO ORGANISMS SEEN    Report Status 06/25/2017 FINAL  Final  Fungus Culture Result     Status: None   Collection Time: 06/24/17  3:39 PM  Result Value Ref Range Status   Result 1 Comment  Final    Comment: (NOTE) KOH/Calcofluor preparation:  no fungus observed. Performed At: [facility] [place], [place] [id number] Rush Farmer MD WC:[id number]   Fungus Culture With Stain     Status: None (Preliminary result)   Collection Time: 07/01/17  3:18 PM  Result Value Ref Range Status   Fungus Stain Final report  Final    Comment: (NOTE) Performed At: [facility] [place], [place] [id number] Rush Farmer MD VQ:[id number]    Fungus (Mycology) Culture PENDING  Incomplete   Fungal Source FLUID  Final    Comment: PERITONEAL  Culture, body fluid-bottle     Status: None   Collection Time: 07/01/17  3:18 PM  Result Value Ref Range Status   Specimen Description FLUID PERITONEAL  Final   Special Requests NONE  Final   Culture   Final    NO GROWTH 5 DAYS Performed at [facility], [address] [place], [place] [address]    Report Status 07/07/2017 FINAL  Final  Gram stain     Status: None   Collection Time: 07/01/17  3:18 PM  Result Value Ref Range Status   Specimen Description PERITONEAL FLUID  Final   Special Requests NONE  Final   Gram Stain NO WBC SEEN NO ORGANISMS SEEN   Final   Report Status 07/02/2017 FINAL  Final  Fungus Culture Result     Status: None   Collection Time: 07/01/17  3:18 PM  Result Value Ref Range Status   Result 1 Comment  Final    Comment:  (NOTE) KOH/Calcofluor preparation:  no fungus observed. Performed At: [facility] [place], [place] [id number] Rush Farmer MD PJ:[id number]     Coagulation Studies: No results for input(s): LABPROT, INR in the last 72 hours.  Urinalysis: No results for input(s): COLORURINE, LABSPEC, PHURINE, GLUCOSEU, HGBUR, BILIRUBINUR, KETONESUR, PROTEINUR, UROBILINOGEN, NITRITE, LEUKOCYTESUR in the last 72 hours.  Invalid input(s): APPERANCEUR    Imaging: No results found.   Medications:   . sodium chloride    . sodium chloride    . cefTAZidime (  FORTAZ)  IV    . vancomycin 750 mg (07/07/17 1700)   . amLODipine  10 mg Oral QHS  . atorvastatin  40 mg Oral q1800  . cinacalcet  60 mg Oral Q supper  . cloNIDine  0.3 mg Oral TID  . darbepoetin (ARANESP) injection - DIALYSIS  200 mcg Intravenous Q Fri-HD  . docusate sodium  100 mg Oral TID  . fentaNYL  12.5 mcg Transdermal Q72H  . ferric citrate  420 mg Oral TID WC  . gabapentin  100 mg Oral BID  . heparin  5,000 Units Subcutaneous Q8H  . hydrALAZINE  100 mg Oral Q8H  . insulin aspart  0-9 Units Subcutaneous TID WC  . insulin glargine  10 Units Subcutaneous QHS  . insulin glargine  20 Units Subcutaneous Daily  . isosorbide dinitrate  60 mg Oral BID  . labetalol  300 mg Oral TID  . metoCLOPramide  2.5 mg Oral TID AC  . multivitamin  1 tablet Oral QHS  . pantoprazole  40 mg Oral Daily  . protein supplement  1 scoop Oral TID WC  . sertraline  50 mg Oral Daily  . sevelamer carbonate  2,400 mg Oral TID WC   sodium chloride, sodium chloride, acetaminophen, alteplase, aluminum hydroxide, bisacodyl, diphenhydrAMINE, guaiFENesin-dextromethorphan, heparin, lidocaine (PF), lidocaine-prilocaine, LORazepam, oxyCODONE, pentafluoroprop-tetrafluoroeth, phenol, polyethylene glycol, prochlorperazine **OR** prochlorperazine **OR** prochlorperazine, traMADol, traZODone  Assessment/ Plan:  Dialysis:MWF NW 4h 95min 85.5kg  2/2.25 L AVF No heparin for now due to small IC bleedby CT 1/16 (was on Hep 2000) -mircera 200 every 2 wks   Impression: 1Sacral decubdifficulty sitting for HD due to sacral pain with question of ability to tolerate outpatient HD. CIR recommended due to functional deficits., not a flap candidate at present, follow up in wound clinic 2 BKA 3 R groin wound  Vanc/Ceftazidime x6 weeks after discussion 4 ESRD HD mwf, Plan dialysis  5 Fall / left parietal bleed, small 6 Ascites - periodic paracentesis (7.9 L para last wk 2/5) 7 Anemia ckd, darbe 200/ wk 8 HTN chron/ severe, normal SBP is 160 - 200,cont mult BP meds, avoid SBP < 140w HD if possible 9Volume wt's down after tap last wk, below dry 10 CAD 11 DM2  [name] appears to be doing well on rehab service and will continue her dialysis   Her day of discharge in Thursday I believe    LOS: 16 [place] W @TODAY @10 :36 AM

## 2017-07-09 NOTE — Progress Notes (Signed)
Pharmacy Antibiotic Note  [name] is a 42 y.o. female with recent complicated hospital course 03/2017 discharged to SNF 1/16, but readmitted on the same day after fall out of bed and had possible small hemorrhagic mass on CT. She was being treated with vanc/zosyn for sacral osteo (1/17-1/23). And s/p RBKA on 1/22, and transferred to CIR on 1/28.   She had a left ischial ulcer for several months now and recent CT scans are consistent with osteomyelitis. Pharmacy was consulted to dose vancomycin/ceftazidime per Dr. Johnnye Sima recommendation, for osteomyelitis. Plan to treat for 6 weeks. She is afebrile, WBC 12.4 (stable), current weight 80 kg (some variations on weight 70-80kg). Repeat peritoneal fluid culture from 2/5 was negative. HD MWF on schedule, last HD done on 2/11. Patient is in Hemodialysis today.  Today 2/13 the pre HD vancomycin level = 11 mcg/ml on 750mg  IV qHD MWF.  Goal = 15-25 mcg/ml pre HD    Plan: Goal Pre-HD level 15-25 mcg/mL Increase Vancomycin to 1000 mg IV Q HD Vancomycin level once weekly or as indicated.  Changed Ceftazidime to 2g IV qHD MWF --will need 6 weeks of antibiotics .  Height: 6\' 4"  (193 cm) Weight: 178 lb 9.2 oz (81 kg) IBW/kg (Calculated) : 82.3  Temp (24hrs), Avg:97.8 F (36.6 C), Min:97.6 F (36.4 C), Max:98.1 F (36.7 C)  Recent Labs  Lab 07/04/17 1331 07/07/17 0519 07/09/17 1513  WBC 12.5* 12.4*  --   CREATININE 4.97* 5.93*  --   VANCORANDOM  --   --  11    Estimated Creatinine Clearance: 16 mL/min (A) (by C-G formula based on SCr of 5.93 mg/dL (H)).    Allergies  Allergen Reactions  . Cephalexin Other (See Comments)    Reaction unknown- Childhood allergy Tolerated Ceftriaxone in the past  . Sulfamethoxazole-Trimethoprim Other (See Comments)    Unknown reaction. Pt states that she was told by her mother that she had allergy to Bactrim as a child.    Antimicrobials this admission: Fortaz 1/29 >>(thru 08/04/17) Vancomycin  1/17>>1/23, 1/29 >>(thru 08/04/17) Zosyn 1/17>>1/23  Microbiology results: 1/17 BCx: neg 1/17 Peritoneal fluid: neg 1/17 MRSA PCR: neg 1/21 surgical PCR: MRSA neg, s.aureus pos 1/29 peritoneal fluid - neg 2/5 peritoneal fluid -neg 2/5 fungal cx - ngtd   Thank you for allowing [place] to participate in this patients care.  Nicole Cella, RPh Clinical Pharmacist Clinical phone for 07/09/2017 from 7a-3:30p: [phone] If after 3:30p, please call main pharmacy at: [phone] 07/09/2017 4:04 PM

## 2017-07-09 NOTE — Progress Notes (Signed)
Occupational Therapy Discharge Summary  Patient Details  Name: [name] MRN: [id number] Date of Birth: [birth year]    Patient has met 7 of 8 long term goals due to improved activity tolerance, improved balance, postural control and ability to compensate for deficits.  Patient to discharge at overall Supervision level.  Patient's care partner is independent to provide the necessary physical assistance at discharge.    Reasons goals not met: pt did not meet the dynamic standing balance goal of min A as she needs min A to hold static standing with B hands on parallel bars  Recommendation:  Patient will benefit from ongoing skilled OT services in home health setting to continue to advance functional skills in the area of BADL.  Equipment: Pt will need an open seat tub bench to use as a BSC but family will obtain on thier own.  Pt's power w/c will not fit into her bathroom at home.     Reasons for discharge: treatment goals met  Patient/family agrees with progress made and goals achieved: Yes  OT Discharge Precautions/Restrictions  Precautions Precautions: Fall Restrictions RLE Weight Bearing: Non weight bearing ADL ADL ADL Comments: see functional naviagtor Vision Baseline Vision/History: No visual deficits Patient Visual Report: No change from baseline Vision Assessment?: No apparent visual deficits Perception  Perception: Within Functional Limits Praxis Praxis: Intact Cognition Overall Cognitive Status: Within Functional Limits for tasks assessed Sensation Sensation Light Touch: (hypersensitive Rt LE) Proprioception: Appears Intact Coordination Gross Motor Movements are Fluid and Coordinated: Yes Fine Motor Movements are Fluid and Coordinated: Yes Motor  Motor Motor - Discharge Observations: generalized weakness Mobility    S with slide board transfers Trunk/Postural Assessment  Cervical Assessment Cervical Assessment: Within Functional Limits Thoracic  Assessment Thoracic Assessment: Within Functional Limits Lumbar Assessment Lumbar Assessment: Within Functional Limits Postural Control Postural Control: Within Functional Limits  Balance Dynamic Sitting Balance Sitting balance - Comments: mod I sitting EOB Static Standing Balance Static Standing - Comment/# of Minutes: min A standing in parallel bars Extremity/Trunk Assessment RUE Assessment RUE Assessment: Within Functional Limits(4-/5 strength) LUE Assessment LUE Assessment: Within Functional Limits(4-/5 strength)   See Function Navigator for Current Functional Status.  [place] 07/09/2017, 12:25 PM

## 2017-07-09 NOTE — Consult Note (Addendum)
   [facility] CM Inpatient Consult   07/09/2017  [name] 1976-05-08 [id number]    [facility] Care Management follow up.  Went to bedside to speak with [name] about [facility] Care Management program, at request of inpatient rehab LCSW.  [name] agreeable to [facility] Management and written consent obtained. [facility] Care Management folder and contact information provided.   [name] lives with husband and daughter. She goes to [facility] on Monday, Wednesday, Friday for HD. Denies having issues with transportation for medications.   Will make referral to [facility] for transition of care due to high risk for readmission. Has had multiple hospitalizations. History of CHF, DM, ESRD, CAD, HTN, PVD.  Writer contacted PCP office to make aware [facility] Management to follow post discharge. Spoke with Keenan Bachelor at office.  Marthenia Rolling, MSN-Ed, RN,BSN [facility] Liaison [phone]

## 2017-07-09 NOTE — Progress Notes (Signed)
Hypoglycemic Event  CBG: 41  Treatment: 15 GM carbohydrate snack  Symptoms: Sweaty  Follow-up CBG: [id number] CBG Result:54  Possible Reasons for Event: Unknown  Comments/MD notified:yes, another 15gm snack given and will recheck or will be breakfast time.    Etheleen Nicks

## 2017-07-09 NOTE — Progress Notes (Signed)
Physical Therapy Discharge Summary  Patient Details  Name: [name] MRN: [id number] Date of Birth: 04/30/1976  Today's Date: 07/09/2017 PT Individual Time: 1100-1155 PT Individual Time Calculation (min): 55 min   Pt performs w/c mobility throughout unit in home and controlled environments with mod I including set up for transfers.  Sliding board transfers with supervision.  sidelying therex for AROM and PROM for hip extension, hip abduction, knee extension.  Pt then states she feels her blood sugar is dropping, blood glucose 71, juice given.  UBE 3 mins fwd/bkwd x 2.  Pt states she feels ready for d/c home tomorrow.  Patient has met 7 of 8 long term goals due to improved activity tolerance, improved balance, improved postural control, increased strength, decreased pain and ability to compensate for deficits.  Patient to discharge at a wheelchair level Supervision.   Patient's care partner is independent to provide the necessary physical assistance at discharge.  Reasons goals not met: pt unable to ambulate due to weakness  Recommendation:  Patient will benefit from ongoing skilled PT services in home health setting to continue to advance safe functional mobility, address ongoing impairments in strength, ROM, mobility, activity tolerance, and minimize fall risk.  Equipment: power w/c, sliding board  Reasons for discharge: treatment goals met and discharge from hospital  Patient/family agrees with progress made and goals achieved: Yes  PT Discharge Precautions/Restrictions Precautions Precautions: Fall Restrictions RLE Weight Bearing: Non weight bearing Pain Pain Assessment Pain Assessment: No/denies pain Pain Score: 8   Cognition Overall Cognitive Status: Within Functional Limits for tasks assessed Sensation Sensation Light Touch: (hypersensitive Rt LE) Proprioception: Appears Intact Coordination Gross Motor Movements are Fluid and Coordinated: Yes Fine Motor  Movements are Fluid and Coordinated: Yes Motor  Motor Motor - Discharge Observations: generalized weakness   Trunk/Postural Assessment  Cervical Assessment Cervical Assessment: Within Functional Limits Thoracic Assessment Thoracic Assessment: Within Functional Limits Lumbar Assessment Lumbar Assessment: Within Functional Limits Postural Control Postural Control: Within Functional Limits  Balance Dynamic Sitting Balance Sitting balance - Comments: mod I sitting EOB Static Standing Balance Static Standing - Comment/# of Minutes: min A standing in parallel bars Extremity Assessment      RLE Assessment RLE Assessment: (-20 degrees knee extension, -20 degrees hip extension) LLE Assessment LLE Assessment: (grossly 3-/5)   See Function Navigator for Current Functional Status.  , 07/09/2017, 11:34 AM  

## 2017-07-09 NOTE — Progress Notes (Signed)
Occupational Therapy Session Note  Patient Details  Name: [name] MRN: [id number] Date of Birth: Aug 18, 1975  Today's Date: 07/09/2017 OT Individual Time: 0830-1000 OT Individual Time Calculation (min): 90 min    Short Term Goals: Week 2:  OT Short Term Goal 1 (Week 2): Pt will tolerate sitting on a padded BSC for 5- 10 min. OT Short Term Goal 2 (Week 2): Pt will be able to use lateral leans to pull pants up/down hips from sitting on a BSC. OT Short Term Goal 3 (Week 2): Pt will be able to tolerate standing in the [place] lift for 2 min to develop standing tolerance.       Skilled Therapeutic Interventions/Progress Updates:    Pt received in bed and stated her R limb was in considerable pain. RN provided meds. Pt worked on bathing and dressing with set up assist only from bed level as this is where she plans to do this at home.  Pt is able to roll in bed and pull pants over hips. She sat to EOB and then practiced with slide board to open seat tub bench with S.  Pt will need to use this type of seat at home for her toileting as she cannot tolerate sitting on a standard drop arm BSC. Once on tub bench commode pt demonstrated lateral leans so she can adjust pants up and down hips with S.  Pt did not need to toilet. She transferred back to bed with board and then to her power w/c. Pt completed grooming at the sink. Pt taken to kitchen to see if pt could stand at sink by pulling up on sink to use as a home activity.  Pt attempted 3x with max A but was not able to come into a stand as she felt that she could not grasp the edge of sink well enough.  Discussed having her husband place a grab bar pt could practice pulling to stand with. Pt then worked on UE strengthening with 2# dowel bar and 2# hand wts for various shoulder and elbow exercises.  Pt states she is ready to go home tomorrow. Pt returned to room with all needs met.  Therapy Documentation Precautions:  Precautions Precautions:  Fall Precaution Comments: wound vac from groin, sacral decubital wound  Restrictions Weight Bearing Restrictions: Yes RLE Weight Bearing: Non weight bearing    Vital Signs: Therapy Vitals Pulse Rate: 69 Resp: 16 BP: (!) 144/64 Patient Position (if appropriate): Lying Oxygen Therapy SpO2: 100 % O2 Device: Not Delivered Pain: Pain Assessment Pain Assessment: 0-10 Pain Score: 8  - RN provided meds  ADL: ADL ADL Comments: see functional naviagtor    See Function Navigator for Current Functional Status.   Therapy/Group: Individual Therapy  [place] 07/09/2017, 9:03 AM

## 2017-07-10 ENCOUNTER — Inpatient Hospital Stay (HOSPITAL_COMMUNITY): Payer: Medicare Other

## 2017-07-10 ENCOUNTER — Encounter (HOSPITAL_COMMUNITY): Payer: Self-pay | Admitting: General Surgery

## 2017-07-10 DIAGNOSIS — I2510 Atherosclerotic heart disease of native coronary artery without angina pectoris: Secondary | ICD-10-CM

## 2017-07-10 DIAGNOSIS — I5033 Acute on chronic diastolic (congestive) heart failure: Secondary | ICD-10-CM

## 2017-07-10 HISTORY — PX: IR PARACENTESIS: IMG2679

## 2017-07-10 LAB — GLUCOSE, CAPILLARY
GLUCOSE-CAPILLARY: 171 mg/dL — AB (ref 65–99)
Glucose-Capillary: 194 mg/dL — ABNORMAL HIGH (ref 65–99)

## 2017-07-10 LAB — BODY FLUID CELL COUNT WITH DIFFERENTIAL
EOS FL: NONE SEEN %
LYMPHS FL: 10 %
Monocyte-Macrophage-Serous Fluid: 63 % (ref 50–90)
NEUTROPHIL FLUID: 27 % — AB (ref 0–25)
WBC FLUID: 398 uL (ref 0–1000)

## 2017-07-10 LAB — GRAM STAIN

## 2017-07-10 LAB — ALBUMIN, PLEURAL OR PERITONEAL FLUID: Albumin, Fluid: 1.4 g/dL

## 2017-07-10 MED ORDER — INSULIN GLARGINE 100 UNIT/ML SOLOSTAR PEN: 10.0000 [IU] | PEN_INJECTOR | Freq: Every day | SUBCUTANEOUS | 11 refills | Status: DC

## 2017-07-10 MED ORDER — SODIUM CHLORIDE 0.9 % IV SOLN: 2.0000 g | INTRAVENOUS | Status: DC

## 2017-07-10 MED ORDER — OXYCODONE HCL 10 MG PO TABS: 5.0000 mg | ORAL_TABLET | Freq: Three times a day (TID) | ORAL | 0 refills | Status: DC | PRN

## 2017-07-10 MED ORDER — TRAMADOL HCL 50 MG PO TABS: 50.0000 mg | ORAL_TABLET | Freq: Four times a day (QID) | ORAL | 0 refills | Status: DC | PRN

## 2017-07-10 MED ORDER — ATORVASTATIN CALCIUM 40 MG PO TABS: 40.0000 mg | ORAL_TABLET | Freq: Every day | ORAL | 0 refills | Status: AC

## 2017-07-10 MED ORDER — ISOSORBIDE DINITRATE 30 MG PO TABS: 60.0000 mg | ORAL_TABLET | Freq: Two times a day (BID) | ORAL | 0 refills | Status: DC

## 2017-07-10 MED ORDER — HYDRALAZINE HCL 100 MG PO TABS: 100.0000 mg | ORAL_TABLET | Freq: Three times a day (TID) | ORAL | 1 refills | Status: AC

## 2017-07-10 MED ORDER — CINACALCET HCL 30 MG PO TABS: 60.0000 mg | ORAL_TABLET | Freq: Every day | ORAL | 0 refills | Status: DC

## 2017-07-10 MED ORDER — SEVELAMER CARBONATE 800 MG PO TABS: 2400.0000 mg | ORAL_TABLET | Freq: Three times a day (TID) | ORAL | 0 refills | Status: AC

## 2017-07-10 MED ORDER — LORAZEPAM 0.5 MG PO TABS: 0.5000 mg | ORAL_TABLET | Freq: Two times a day (BID) | ORAL | 0 refills | Status: AC | PRN

## 2017-07-10 MED ORDER — ACETAMINOPHEN 325 MG PO TABS: 325.0000 mg | ORAL_TABLET | ORAL | Status: DC | PRN

## 2017-07-10 MED ORDER — FERRIC CITRATE 1 GM 210 MG(FE) PO TABS: 420.0000 mg | ORAL_TABLET | Freq: Three times a day (TID) | ORAL | 0 refills | Status: AC

## 2017-07-10 MED ORDER — CLONIDINE HCL 0.3 MG PO TABS: 0.3000 mg | ORAL_TABLET | Freq: Three times a day (TID) | ORAL | 0 refills | Status: AC

## 2017-07-10 MED ORDER — GABAPENTIN 100 MG PO CAPS: 100.0000 mg | ORAL_CAPSULE | Freq: Two times a day (BID) | ORAL | 0 refills | Status: AC

## 2017-07-10 MED ORDER — SERTRALINE HCL 50 MG PO TABS: 50.0000 mg | ORAL_TABLET | Freq: Every day | ORAL | 0 refills | Status: AC

## 2017-07-10 MED ORDER — VANCOMYCIN HCL IN DEXTROSE 1-5 GM/200ML-% IV SOLN: 1000.0000 mg | INTRAVENOUS | Status: DC

## 2017-07-10 MED ORDER — AMLODIPINE BESYLATE 10 MG PO TABS: 10.0000 mg | ORAL_TABLET | Freq: Every day | ORAL | 0 refills | Status: AC

## 2017-07-10 MED ORDER — PANTOPRAZOLE SODIUM 40 MG PO TBEC: 40.0000 mg | DELAYED_RELEASE_TABLET | Freq: Every day | ORAL | 0 refills | Status: AC

## 2017-07-10 MED ORDER — LIDOCAINE HCL 2 % IJ SOLN
INTRAMUSCULAR | Status: DC | PRN
  Administered 2017-07-10: 10 mL

## 2017-07-10 MED ORDER — METOCLOPRAMIDE HCL 5 MG PO TABS: 2.5000 mg | ORAL_TABLET | Freq: Three times a day (TID) | ORAL | 0 refills | Status: AC

## 2017-07-10 MED ORDER — LABETALOL HCL 300 MG PO TABS: 300.0000 mg | ORAL_TABLET | Freq: Three times a day (TID) | ORAL | 0 refills | Status: AC

## 2017-07-10 MED ORDER — LIDOCAINE 2% (20 MG/ML) 5 ML SYRINGE
INTRAMUSCULAR | Status: AC
  Filled 2017-07-10: qty 10

## 2017-07-10 NOTE — Discharge Summary (Signed)
Physician Discharge Summary  Patient ID: [name] MRN: [id number] DOB/AGE: 42-Oct-1977 42 y.o.  Admit date: 06/23/2017 Discharge date: 07/10/2017  Discharge Diagnoses:  Principal Problem:   Unilateral complete BKA, right, initial encounter [name]   Anemia of chronic disease   ESRD on dialysis ([place])   Diabetes mellitus type 2 in nonobese [name])   Phantom limb pain ([place])   Subacute osteomyelitis, other site ([place])   Leukocytosis   Hypertensive crisis   Discharged Condition: stable   Significant Diagnostic Studies: Ir Paracentesis  Result Date: 07/01/2017 INDICATION: Patient with a history of cirrhosis and recurrent abdominal ascites. Request is made for diagnostic and therapeutic paracentesis. EXAM: ULTRASOUND GUIDED DIAGNOSTIC AND THERAPEUTIC PARACENTESIS MEDICATIONS: 2% lidocaine COMPLICATIONS: None immediate. PROCEDURE: Informed written consent was obtained from the patient after a discussion of the risks, benefits and alternatives to treatment. A timeout was performed prior to the initiation of the procedure. Initial ultrasound scanning demonstrates a large amount of ascites within the left lower abdominal quadrant. The left lower abdomen was prepped and draped in the usual sterile fashion. 2% lidocaine was used for local anesthesia. Following this, a 19 gauge, 7-cm, Yueh catheter was introduced. An ultrasound image was saved for documentation purposes. The paracentesis was performed. The catheter was removed and a dressing was applied. The patient tolerated the procedure well without immediate post procedural complication. FINDINGS: A total of approximately 7.9 L of serous fluid was removed. Samples were sent to the laboratory as requested by the clinical team. IMPRESSION: Successful ultrasound-guided paracentesis yielding 7.9 liters of peritoneal fluid. Read by: Saverio Danker, PA-C Electronically Signed   By: Aletta Edouard M.D.   On: 07/01/2017 15:24   Ir  Paracentesis  Result Date: 06/25/2017 INDICATION: Patient with history of ascites. Request is made for diagnostic and therapeutic paracentesis. EXAM: ULTRASOUND GUIDED DIAGNOSTIC AND THERAPEUTIC PARACENTESIS MEDICATIONS: 10 mL 1% lidocaine COMPLICATIONS: None immediate. PROCEDURE: Informed written consent was obtained from the patient after a discussion of the risks, benefits and alternatives to treatment. A timeout was performed prior to the initiation of the procedure. Initial ultrasound scanning demonstrates a large amount of ascites within the left lateral abdomen. The left lateral abdomen was prepped and draped in the usual sterile fashion. 1% lidocaine was used for local anesthesia. Following this, a 19 gauge, 7-cm, Yueh catheter was introduced. An ultrasound image was saved for documentation purposes. The paracentesis was performed. The catheter was removed and a dressing was applied. The patient tolerated the procedure well without immediate post procedural complication. FINDINGS: A total of approximately 2.0 liters of clear, yellow fluid was removed. Samples were sent to the laboratory as requested by the clinical team. IMPRESSION: Successful ultrasound-guided diagnostic and therapeutic paracentesis yielding 2.0 liters of peritoneal fluid. Patient poorly tolerated procedure due to pain and inability to position or reposition easily and comfortably. Procedure was stopped prior to removal of all fluid at the patient's request. Read by: Brynda Greathouse PA-C Electronically Signed   By: Sandi Mariscal M.D.   On: 06/24/2017 15:57   Ir Paracentesis  Result Date: 06/12/2017 INDICATION: Abdominal distention secondary to ascites. Request for diagnostic and therapeutic paracentesis. EXAM: ULTRASOUND GUIDED RIGHT LOWER QUADRANT PARACENTESIS MEDICATIONS: None. COMPLICATIONS: None immediate. PROCEDURE: Informed written consent was obtained from the patient after a discussion of the risks, benefits and alternatives to  treatment. A timeout was performed prior to the initiation of the procedure. Initial ultrasound scanning demonstrates a large amount of ascites within the right lower abdominal quadrant. The  right lower abdomen was prepped and draped in the usual sterile fashion. 1% lidocaine with epinephrine was used for local anesthesia. Following this, a 19 gauge, 10-cm, Yueh catheter was introduced. An ultrasound image was saved for documentation purposes. The paracentesis was performed. The catheter was removed and a dressing was applied. The patient tolerated the procedure well without immediate post procedural complication. FINDINGS: A total of approximately 9.8 L of hazy, dark yellow fluid was removed. Samples were sent to the laboratory as requested by the clinical team. IMPRESSION: Successful ultrasound-guided paracentesis yielding 9.8 liters of peritoneal fluid. Read by: Ascencion Dike PA-C Electronically Signed   By: Aletta Edouard M.D.   On: 06/12/2017 13:25    Labs:  Basic Metabolic Panel: Recent Labs  Lab 07/04/17 1331 07/07/17 0519 07/09/17 1619  NA 135 137 135  K 4.8 4.7 4.8  CL 94* 96* 95*  CO2 26 25 26   GLUCOSE 96 135* 131*  BUN 44* 54* 47*  CREATININE 4.97* 5.93* 5.17*  CALCIUM 7.9* 7.9* 8.1*  PHOS 4.4  --  2.6    CBC: Recent Labs  Lab 07/04/17 1331 07/07/17 0519 07/09/17 1619  WBC 12.5* 12.4* 11.6*  HGB 8.5* 8.7* 9.2*  HCT 28.0* 29.4* 30.1*  MCV 91.5 93.0 92.9  PLT 262 256 265    CBG: Recent Labs  Lab 07/09/17 1136 07/09/17 1852 07/09/17 2103 07/10/17 0541 07/10/17 0701  GLUCAP 71 113* 153* 171* 194*   Filed Weights   07/09/17 0500 07/10/17 0532 07/10/17 1300  Weight: 81 kg (178 lb 9.2 oz) 83 kg (182 lb 15.7 oz) 79.5 kg (175 lb 4.8 oz)    Brief HPI:   Janet E Robertsis a 42 y.o.femalewith histoyr of CAD, ESRD, T2DM, right foot wounds s/p revascularization complicated hospital course 03/2017, sacral decub and RLE ischemia requiring right metatarsal amputation  with exploration of right groin wound 04/2017. She was discharged to SNF 1/16 and readmitted later that evening due to fall out of bed. CT head revealed hyperdense focus in left posterior parietal region suspicious for small hemorrhagic mass, reviewed.Dr. Christella Noa recommended follow up MRI but she was unable to lie flat for MRI and can be done on outpatient basis per NS. She was found to have ascites due to fluid overload and underwent paracentesis of 9.8 L fluid.   CT abdomen/pelvis done due to stage IV sacral decub and wound drainage and showed sacral osteomyelitis. She was started on IV Vanc and Zosyn and underwent R-BKA on 01/22 by Dr. Trula Slade. She continued with [name] to right groin to promote healing and antibiotics were dscontinued with recommendations to monitor wound over the week.  She continued to have sacral pain with difficulty sitting and question ability to tolerate outpatient HD. [name] was recommended due to functional deficits.   Hospital Course: [name] was admitted to rehab 06/23/2017 for inpatient therapies to consist of PT and OT at least three hours five days a week. Past admission physiatrist, therapy team and rehab RN have worked together to provide customized collaborative inpatient rehab.  Blood pressures were monitored on bid basis and have been very labile with episodic malignant HTN. Blood pressure medications have been titrated upwards for tighter control.  Diabetes has been monitored with achs cbg checks and Lantus insulin has been adjusted to prevent hypoglycemic episodes.   She has required paracentesis to manage recurrent ascites during her stay. She had 2 liters of clear yellow fluid removed on 1/29, 7.9 liters of serous fluid on 2/5 and  5.8 liters of serous fluid  on 2/14. No organisms seen on gram stain and prior cultures have been negative.  Dr. Johnnye Sima was consulted for input on antibiotics and recommended resuming antibiotics. At least 6 week of IV Vancomycin  and Ceftazidime with 01/29 as day #1 and he felt that she may require protracted oral therapy depending on response. Vancomycin levels 11 on 2/13 prior to HD and pre HD goal 15- 25 therefore Vancomycin dose was increased to 1000 mg with HD and Ceftazidime was adjusted to 2 g IV with HD.    [name] has been following for input and wound bed is clean without drainage or odor. Air mattress was ordered for pressure relief and she is to continue damp to dry dressing bid. [name] was consulted for input and felt that no debridement needed and not flap candidate with bone exposure.  To continue local therapy and follow up with wound [name] at [name].  Follow up CBC shows that reactive leucocytosis is improving and anemia of chronic disease.  Right groin incision has been healing well and has closed up without signs of infection. R-BKA was noted to have diffuse redness with concerns of subcutaneous hematoma. Incision clean, dry and intact without signs of infection and redness has resolved. She did develop a small blister at medial aspect of incision and is to follow up with [facility] in 2-3 weeks for follow up appointment.   Protein supplement were added to help with low calorie malnutrition as well as promote healing. Po intake has been good and she is continent of bowel.  Gabapentin was added and increased to 100 mg bid to help manage neuropathic pain. Pain control is improving and she is being weaned off fentanyl patch. She was educated on need to taper off oxycodone over next few weeks as she will be able to pace her activity levels. Anemia of chronic disease has been managed with aranesp 200 mc/weekly and H/H is up to 9.2/28.4. Her mood has improved with improvement in mentation as well as endurance levels.  Power chair evaluation was done during her stay and loaner chair to be delivered to home tomorrow.  She has progressed to supervision at wheelchair level and will continue to receive follow up [place] [place] [facility] after discharge.    Rehab course: During patient's stay in rehab weekly team conferences were held to monitor patient's progress, set goals and discuss barriers to discharge. At admission, patient required max assist with ADL tasks and total assist with mobility.  She exhibited subtle higher level cognitive deficits with MoCA score of 19/30 but was able to perform much better with functional tasks.  She  has had improvement in activity tolerance, balance, postural control as well as ability to compensate for deficits. She is able to complete ADL tasks with supervision.  She is able to propel her wheelchair independently. She is able to perform sliding board transfers with supervision and able to propel her wheelchair with supervision. Her cognition has improved and she requires supervision with problem solving and money management. Patient felt that she was back to baseline and ST was discontinued on 2/6.  Family education was completed with husband regarding all aspects of safety, mobility as well as wound care.    Disposition: 01-Home or Self Care  Diet: Renal diet.   Wound Care: 1- Groin wound: Wash area with soap and water. Pat dry. No lotions, ointments or creams. Contact MD if you develop any problems  with your incision/wound--redness, swelling, increase in pain, drainage or if you develop fever or chills.                          2- Sacral wound: Pack left ischial wound with gauze moistened with NS.  Top with dry gauze and secure with tape.Change twice daily and PRN soiling.  Special Instructions: 1. Keep Right knee extended as much as possible and continue HEP.  2. Wean pain medications every 4-5 days. 3. Boost every 20 minutes when in chair.   Discharge Instructions    AMB Referral to [facility]   Complete by:  As directed    Please assign to Man for transition of care. Goes to HD on Mondays, Wednesday, and Fridays. Has  history of ESRD, HD, CHF, DM, HTN. Written consent obtained. Hospital Liaison contacted PCP office to make aware [facility] to follow post discharge due to high risk of readmission and multiple hospitalizations. Currently on inpatient rehab unit. To discharge in next day or so. Please call with questions. Marthenia Rolling, [place], [name] [phone]   Reason for consult:  Please [facility] to [facility] RNCM   Diagnoses of:   Diabetes Kidney Failure Heart Failure     Expected date of contact:  1-3 days (reserved for hospital discharges)   Ambulatory referral to Physical Medicine Rehab   Complete by:  As directed    1-2 weeks transitional care appt     Allergies as of 07/10/2017      Reactions   Cephalexin Other (See Comments)   Reaction unknown- Childhood allergy Tolerated Ceftriaxone in the past   Sulfamethoxazole-trimethoprim Other (See Comments)   Unknown reaction. Pt states that she was told by her mother that she had allergy to Bactrim as a child.      Medication List    STOP taking these medications   aspirin 81 MG EC tablet   feeding supplement (PRO-STAT SUGAR FREE 64) Liqd   fentaNYL 25 MCG/HR patch Commonly known as:  DURAGESIC - dosed mcg/hr     TAKE these medications   acetaminophen 325 MG tablet Commonly known as:  TYLENOL Take 1-2 tablets (325-650 mg total) by mouth every 4 (four) hours as needed for mild pain.   amLODipine 10 MG tablet Commonly known as:  NORVASC Take 1 tablet (10 mg total) by mouth daily after supper. What changed:  when to take this   ARANESP (ALBUMIN FREE) 200 MCG/ML Soln Generic drug:  Darbepoetin Alfa Inject 200 mg as directed. Give at dialysis on Wednesday   atorvastatin 40 MG tablet Commonly known as:  LIPITOR Take 1 tablet (40 mg total) by mouth daily at 6 PM.   cefTAZidime 2 g in sodium chloride 0.9 % 100 mL Inject 2 g into the vein every Monday, Wednesday, and Friday with hemodialysis.    cinacalcet 30 MG tablet Commonly known as:  SENSIPAR Take 2 tablets (60 mg total) by mouth daily with supper.   cloNIDine 0.3 MG tablet Commonly known as:  CATAPRES Take 1 tablet (0.3 mg total) by mouth 3 (three) times daily.   docusate 50 MG/5ML liquid Commonly known as:  COLACE Take 100 mg by mouth 3 (three) times daily.   feeding supplement (NEPRO CARB STEADY) Liqd Take 237 mLs by mouth 2 (two) times daily between meals.   ferric citrate 1 GM 210 MG(Fe) tablet Commonly known as:  AURYXIA Take 2 tablets (420  mg total) by mouth 3 (three) times daily with meals.   gabapentin 100 MG capsule Commonly known as:  NEURONTIN Take 1 capsule (100 mg total) by mouth 2 (two) times daily.   hydrALAZINE 100 MG tablet Commonly known as:  APRESOLINE Take 1 tablet (100 mg total) by mouth every 8 (eight) hours.   isosorbide dinitrate 30 MG tablet Commonly known as:  ISORDIL Take 2 tablets (60 mg total) by mouth 2 (two) times daily. Give two 30 mg tablets to = 60 mg BID   labetalol 300 MG tablet Commonly known as:  NORMODYNE Take 1 tablet (300 mg total) by mouth 3 (three) times daily.   LANTUS SOLOSTAR 100 UNIT/ML Solostar Pen Generic drug:  Insulin Glargine Inject 20 Units into the skin every morning. What changed:  Another medication with the same name was changed. Make sure you understand how and when to take each.   Insulin Glargine 100 UNIT/ML Solostar Pen Commonly known as:  LANTUS SOLOSTAR Inject 10 Units into the skin daily at 10 pm. What changed:  how much to take   LORazepam 0.5 MG tablet Commonly known as:  ATIVAN Take 1 tablet (0.5 mg total) by mouth every 12 (twelve) hours as needed for anxiety. What changed:    medication strength  how much to take  when to take this   metoCLOPramide 5 MG tablet Commonly known as:  REGLAN Take 0.5 tablets (2.5 mg total) by mouth 3 (three) times daily before meals. What changed:    medication strength  how much to  take  when to take this   b complex-vitamin c-folic acid 0.8 MG Tabs tablet Take 1 tablet by mouth daily.   multivitamin Tabs tablet Take 1 tablet by mouth at bedtime.   ondansetron 4 MG tablet Commonly known as:  ZOFRAN Take 4 mg by mouth every 6 (six) hours as needed for nausea or vomiting.   Oxycodone HCl 10 MG Tabs--Rx # 30 pills  Take 0.5-1 tablets (5-10 mg total) by mouth every 8 (eight) hours as needed for severe pain. What changed:    medication strength  how much to take  when to take this  reasons to take this   pantoprazole 40 MG tablet Commonly known as:  PROTONIX Take 1 tablet (40 mg total) by mouth daily.   sertraline 50 MG tablet Commonly known as:  ZOLOFT Take 1 tablet (50 mg total) by mouth daily.   sevelamer carbonate 800 MG tablet Commonly known as:  RENVELA Take 3 tablets (2,400 mg total) by mouth 3 (three) times daily with meals. Give 3 tablets to = 2400 mg TID   traMADol 50 MG tablet--Rx # 30 pills Commonly known as:  ULTRAM Take 1 tablet (50 mg total) by mouth every 6 (six) hours as needed for moderate pain.   vancomycin 1-5 GM/200ML-% Soln Commonly known as:  VANCOCIN Inject 200 mLs (1,000 mg total) into the vein every Monday, Wednesday, and Friday with hemodialysis. Start taking on:  07/11/2017      Follow-up Information    Jamse Arn, MD Follow up.   Specialty:  Physical Medicine and Rehabilitation Contact information: [address] [place] [address] [phone]        Ashok Pall, MD. Call in 1 day(s).   Specialty:  Neurosurgery Why:  for follow up appointment/Work up of area in brain/MRI brain Contact information: [address] [place] [address] [phone]  Serafina Mitchell, MD. Call in 1 day(s).   Specialties:  Vascular Surgery, Cardiology Why:  for follow up appointment Contact information: [place] [address] [phone]         Merrilee Seashore, MD. Go on 07/21/2017.   Specialty:  Internal Medicine Why:  @ 2:00 PM Contact information: [address] [place] [address] [phone]        [name]              Follow up on 07/24/2017.   Why:  appointment at 9:15 am/be there at 9 am. Contact information: [address] [place] [phone]       Campbell Riches, MD .   Specialty:  Infectious Diseases Contact information: [place] [address] [phone]           Signed: Bary Leriche 07/10/2017, 7:21 PM

## 2017-07-10 NOTE — Progress Notes (Signed)
Social Work Patient ID: [name], female   DOB: December 21, 1975, 42 y.o.   MRN: [id number]      Lex Linhares, Levin Erp  Social Worker    Patient Care Conference  Signed  Date of Service:  07/10/2017  1:01 PM          Signed          '[]' Hide copied text  '[]' Hover for details   Inpatient RehabilitationTeam Conference and Plan of Care Update Date: 07/09/2017   Time: 2:30 PM      Patient Name: [name]      Medical Record Number: [id number]  Date of Birth: 1976-05-14 Sex: Female         Room/Bed: 4M07C/4M07C-01 Payor Info: Payor: MEDICARE / Plan: MEDICARE PART A AND B / Product Type: *No Product type* /     Admitting Diagnosis: L BKA, hs mef  Admit Date/Time:  06/23/2017  4:23 PM Admission Comments: No comment available    Primary Diagnosis:  Unilateral complete BKA, right, initial encounter ([place]) Principal Problem: Unilateral complete BKA, right, initial encounter [facility])       Patient Active Problem List    Diagnosis Date Noted  . Labile blood glucose    . Hypertensive crisis    . Leukocytosis    . Hypoglycemia    . Postoperative pain    . Phantom limb pain ([name])    . Subacute osteomyelitis, other site ([place])    . Pain    . Diabetic osteomyelitis ([place])    . Acute blood loss anemia    . Anemia of chronic disease    . ESRD on dialysis ([place])    . Benign essential HTN    . Diabetes mellitus type 2 in nonobese (HCC)    . Unilateral complete BKA, right, initial encounter ([place]) 06/23/2017  . Post-operative pain    . Unilateral complete BKA, right, sequela ([place])    . Stage IV pressure ulcer of sacral region ([place]) 06/16/2017  . Wound healing, delayed    . Sacral osteomyelitis ([place])    . Chronic renal failure    . Brain mass 06/11/2017  . Fall 06/11/2017  . 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 History of ETT    . Pressure injury of skin 04/22/2017  . Acute respiratory failure with hypoxia ([place])    . Cardiac arrest, cause  unspecified ([place])    . Non-healing ulcer ([place])    . Hypertension 03/24/2017  . Hypertensive urgency 03/24/2017  . Cellulitis in diabetic foot ([place]) 03/24/2017  . Cellulitis 03/24/2017  . Chronic pain 03/24/2017  . Diabetic ulcer of lower leg ([employer]) 03/24/2017  . Gangrene of toe of right foot ([place])    . Acute on chronic diastolic CHF (congestive heart failure) ([place]) 02/22/2016  . Fluid overload    . Pericardial effusion    . Ascites 02/15/2016  . Uncontrolled diabetes mellitus with end-stage renal disease ([place]) 02/16/2015  . Bimalleolar ankle fracture 02/12/2015  . CAD (coronary artery disease) 02/09/2015  . PAD (peripheral artery disease) ([place])    . Gastroparesis    . Chronic anemia    . Menorrhagia with regular cycle 01/04/2014  . Dysmenorrhea 01/04/2014  . Nausea & vomiting 10/16/2011  . History of noncompliance with medical treatment 05/09/2011  . Chronic UTI 05/09/2011  . CIN III (cervical intraepithelial neoplasia grade III) with severe dysplasia    . History of abscesses in groins 12/06/2010  . End  stage renal disease on dialysis ([place])    . Coronary artery disease    . Hyperlipidemia    . 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 NONDEPENDENT TOBACCO USE DISORDER 12/20/2009  . Essential hypertension, benign 12/20/2009  . NEPHROTIC SYNDROME 12/20/2009      Expected Discharge Date: Expected Discharge Date: 07/10/17   Team Members Present: Physician leading conference: Dr. Delice Lesch Social Worker Present: Alfonse Alpers, LCSW Nurse Present: Rayetta Humphrey, RN PT Present: Roderic Ovens, PT OT Present: Simonne Come, OT SLP Present: Windell Moulding, SLP PPS Coordinator present : Daiva Nakayama, RN, CRRN       Current Status/Progress Goal Weekly Team Focus  Medical      Deficits with mobility, transfers, self-care secondary to Right BKA and wounds.  Improve wounds, mobility, transfers, compliance, safety, ascities, osteo  See above   Bowel/Bladder     anuric on HD, incontinent of bowel,  LBM 2/12,   maintain bowels with max assist, medication  monitor bowels  q shfit and prn, HD mon-wed-thurs.               Swallow/Nutrition/ Hydration               ADL's     min A transfers with board, min- mod A LB self care  min A overall   ADL training, UE strengthening, pt/family education   Mobility     supervision/min A  min A  d/c planning   Communication               Safety/Cognition/ Behavioral Observations             Pain     fentanyl patches 2x 12 mcg.  prn oxycodone and ultram, increased right stump pain on 2/12  pain <3 with min assist, medication  assess pain q 4 hours prn, assess for effectiveness of medication and need for breakthrough meds   Skin     stage 4 ischial wound with visible bone and osteomyelitis, NS wet to dry dressing BID.   right groin healed, R bka with non-adherent, gauze then kerlix daily.  maintain skin without further breakdown, no infection,   continue with dressing change to ischial wound bid, right  stump dressing daily.     Rehab Goals Patient on target to meet rehab goals: Yes Rehab Goals Revised: none *See Care Plan and progress notes for long and short-term goals.      Barriers to Discharge   Current Status/Progress Possible Resolutions Date Resolved   Physician     Medical stability;Decreased caregiver support;Lack of/limited family support;Hemodialysis;Medication compliance;Other (comments);IV antibiotics  Ascites  See above  Therapies, HD recs per Nephro, Paracentesis as tolerated, follow labs, optimize pain meds, IV abx      Nursing                 PT                    OT                 SLP            SW              Discharge Planning/Teaching Needs:  Pt to return to her home with her husband and dtr to provide min A care.  Family has completed education and feels prepared to care for pt at home.   Team Discussion:  Pt is stable from a medical standpoint.  RN  discussed pt's need for dressing changes at home.  CSW  stated that pt would have [facility] RN for this, but if family is available, we should do some teaching.  Pt to have [facility] at home for PT/OT/RN.  PT had pt evaluated for a power chair.  Family education completed with PT and OT and pt has met goals.  Revisions to Treatment Plan:  none    Continued Need for Acute Rehabilitation Level of Care: The patient requires daily medical management by a physician with specialized training in physical medicine and rehabilitation for the following conditions: Daily direction of a multidisciplinary physical rehabilitation program to ensure safe treatment while eliciting the highest outcome that is of practical value to the patient.: Yes Daily medical management of patient stability for increased activity during participation in an intensive rehabilitation regime.: Yes Daily analysis of laboratory values and/or radiology reports with any subsequent need for medication adjustment of medical intervention for : Wound care problems;Diabetes problems;Blood pressure problems;Post surgical problems;Renal problems;Other   Amyria Komar, Silvestre Mesi 07/10/2017, 2:27 PM

## 2017-07-10 NOTE — Progress Notes (Signed)
Social Work Patient ID: [name], female   DOB: [birth year], 42 y.o.   MRN: [id number]   CSW met with pt and her husband to update them on team conference discussion and to assure they felt comfortable with 07-10-17 d/c date and they do.  CSW explained that 30" slide board would be delivered and that CSW would set up [facility].  Pt to receive IV antibiotics at dialysis.  She was pleased she would not have to have them at home.  CSW remains available to assist as needed.

## 2017-07-10 NOTE — Progress Notes (Signed)
Recreational Therapy Discharge Summary Patient Details  Name: [name] MRN: [id number] Date of Birth: July 15, 1975 Today's Date: 07/10/2017 Comments on progress toward goals: Pt participated in leisure discussion during TR sessions.  Focused on activity analysis with potential modifications once home.  Pt reports supportive family and feels she will be able to participate in previously enjoyed activities as she transitions home with their support.   Reasons for discharge: discharge from hospital  Patient/family agrees with progress made and goals achieved: Yes  Chang Tiggs 07/10/2017, 10:47 AM

## 2017-07-10 NOTE — Progress Notes (Signed)
[place] PHYSICAL MEDICINE & REHABILITATION     PROGRESS NOTE  Subjective/Complaints:  Patient seen lying in bed this morning. She states she slept well overnight. She states she is ready to go home.  ROS: Denies CP, SOB, nausea, vomiting, diarrhea.  Objective: Vital Signs: Blood pressure (!) 183/89, pulse 81, temperature 98.8 F (37.1 C), temperature source Oral, resp. rate 16, height 6\' 4"  (1.93 m), weight 83 kg (182 lb 15.7 oz), last menstrual period 10/09/2015, SpO2 99 %. No results found. Recent Labs    07/09/17 1619  WBC 11.6*  HGB 9.2*  HCT 30.1*  PLT 265   Recent Labs    07/09/17 1619  NA 135  K 4.8  CL 95*  GLUCOSE 131*  BUN 47*  CREATININE 5.17*  CALCIUM 8.1*   CBG (last 3)  Recent Labs    07/09/17 2103 07/10/17 0541 07/10/17 0701  GLUCAP 153* 171* 194*    Wt Readings from Last 3 Encounters:  07/10/17 83 kg (182 lb 15.7 oz)  06/23/17 76.8 kg (169 lb 6.4 oz)  06/11/17 83.5 kg (184 lb 1.3 oz)    Physical Exam:  BP (!) 183/89 (BP Location: Right Arm)   Pulse 81   Temp 98.8 F (37.1 C) (Oral)   Resp 16   Ht 6\' 4"  (1.93 m)   Wt 83 kg (182 lb 15.7 oz)   LMP 10/09/2015 Comment: pt on dialysis  SpO2 99%   BMI 22.27 kg/m  Constitutional: She has a sickly appearance. No distress.  HENT: Normocephalic and atraumatic.  Eyes:  and EOM are normal. No discharge.  Cardiovascular: RRR. No JVD. Respiratory: Effort normal and breath sounds normal.  GI: Bowel sounds are normal. She exhibits distension. Ascites, persistent.  Musculoskeletal: She exhibits mild edema.  Neurological: She is alert and oriented.  Motor: 4--4/5 B/l UE, LLE, RLE HF (stable) Skin: Skin is warm and dry. She is not diaphoretic.  Ischial Ulcer, not examined today BKA with dressing c/d/i Dusky left foot Psychiatric: Her speech is normal. Blunt.  Assessment/Plan: 1. Functional deficits secondary to Right BKA and wounds which require 3+ hours per day of interdisciplinary therapy in  a comprehensive inpatient rehab setting. Physiatrist is providing close team supervision and 24 hour management of active medical problems listed below. Physiatrist and rehab team continue to assess barriers to discharge/monitor patient progress toward functional and medical goals.  Function:  Bathing Bathing position   Position: Bed  Bathing parts Body parts bathed by patient: Right arm, Left arm, Chest, Abdomen, Front perineal area, Buttocks, Right upper leg, Left upper leg, Left lower leg, Back Body parts bathed by helper: Front perineal area, Buttocks, Right upper leg, Left upper leg, Right lower leg, Left lower leg  Bathing assist Assist Level: Set up      Upper Body Dressing/Undressing Upper body dressing   What is the patient wearing?: Pull over shirt/dress     Pull over shirt/dress - Perfomed by patient: Thread/unthread right sleeve, Thread/unthread left sleeve, Put head through opening, Pull shirt over trunk Pull over shirt/dress - Perfomed by helper: Pull shirt over trunk        Upper body assist Assist Level: Set up      Lower Body Dressing/Undressing Lower body dressing   What is the patient wearing?: Pants, Non-skid slipper socks     Pants- Performed by patient: Thread/unthread right pants leg, Thread/unthread left pants leg, Pull pants up/down Pants- Performed by helper: Pull pants up/down   Non-skid slipper socks- Performed  by helper: Don/doff left sock Socks - Performed by patient: Don/doff left sock Socks - Performed by helper: Don/doff right sock, Don/doff left sock   Shoes - Performed by helper: Don/doff right shoe, Don/doff left shoe, Fasten right, Fasten left          Lower body assist Assist for lower body dressing: Set up      Toileting Toileting   Toileting steps completed by patient: Adjust clothing prior to toileting, Adjust clothing after toileting(simulated on BSC) Toileting steps completed by helper: Adjust clothing prior to toileting,  Performs perineal hygiene, Adjust clothing after toileting    Toileting assist     Transfers Chair/bed transfer   Chair/bed transfer method: Lateral scoot Chair/bed transfer assist level: Supervision or verbal cues Chair/bed transfer assistive device: Armrests, Sliding board Mechanical lift: Maximove   Locomotion Ambulation Ambulation activity did not occur: Safety/medical concerns         Wheelchair Wheelchair activity did not occur: (pt in TIS at this time) Type: Motorized Max wheelchair distance: >150' Assist Level: No help, No cues, assistive device, takes more than reasonable amount of time  Cognition Comprehension Comprehension assist level: Follows complex conversation/direction with extra time/assistive device  Expression Expression assist level: Expresses complex ideas: With extra time/assistive device  Social Interaction Social Interaction assist level: Interacts appropriately with others with medication or extra time (anti-anxiety, antidepressant).  Problem Solving Problem solving assist level: Solves complex problems: With extra time  Memory Memory assist level: More than reasonable amount of time    Medical Problem List and Plan: 1.  Deficits with mobility, transfers, self-care secondary to Right BKA and wounds.   D/c today   Will see patient for transitional care management in 1-2 weeks   2.  DVT Prophylaxis/Anticoagulation: Pharmaceutical: Lovenox 3. Pain Management:    Fentanyl 25 mcg patch, decreased to 12.5 on 2/13, will DC at discharge    IV dilaudid transitioned to oxycodone prn.     Gabapentin increased to 100 twice a day on 2/6 with improvement 4. Mood: Offer Ego support.  5. Neuropsych: This patient is capable of making decisions on her own behalf. 6. Skin/Wound Care: has evidence of malnutrition. Has been refusing nephro as well as protein supplements. Encourage intake to help promote wound healing as well as issues with ascites.    Ischial  Decub: Air  mattress overlay for pressure relief measures. Added protein supplement for malnutrition. Evaluated by PRS, not a flap candidate at present, follow up in wound clinic           Right groin wound   BKA stump evaluated by vascular, continue current management 7. Fluids/Electrolytes/Nutrition: Renal diet with 1200 cc FR. Monitor daily weights.  8. Left Ischium decub with osteo: Daily local care with saline dressing. Need to encourage intake of supplements to promote wound healing.     Per ID, Vanc/Ceftazidime x6 weeks after discussion   Vanc level within normal limits on 2/6 9. T2DM: Monitor BS ac/hs.    Continue Lantus 20 units in the a.m.   Lantus 14 units at bedtime and increase to 19 units on 2/1, increased further over the weekend, decreased to 20 units on 2/4, decreased to 18 units on 2/12, decreased to 10 on 2/13   SSI for elevated BS.    CBG (last 3)  Recent Labs    07/09/17 2103 07/10/17 0541 07/10/17 0701  GLUCAP 153* 171* 194*    Relatively controlled on 2/14   Noncompliant with diet 10. HTN: Monitor  BP bid. Continue isosorbide bid, labetalol tid, clonidine tid and norvasc daily.  Vitals:   07/09/17 2232 07/10/17 0532  BP: 102/78 (!) 183/89  Pulse: 79 81  Resp: 16 16  Temp: 98.5 F (36.9 C) 98.8 F (37.1 C)  SpO2: 95% 99%     Hydralazine 10 3 times a day started on 1/30, increased to 25 on 1/31, increased to 50 on 2/4, increased to 100 on 2/6   Extremely labile with hypertensive urgency overnight, we'll need to continue to monitor closely in ambulatory setting and make adjustments as necessary 11. Pruritis: Question due to rise in phosphorous levels. Sarna tid with benadryl prn.  12. Ascites: Monitor weight daily.  Filed Weights   07/07/17 1835 07/09/17 0500 07/10/17 0532  Weight: 80 kg (176 lb 5.9 oz) 81 kg (178 lb 9.2 oz) 83 kg (182 lb 15.7 oz)    Incomplete tap on 1/29, removing 2 L, removed 7.9 L on 2/6, appreciate VIR   Fluid analysis remains pending 2/13    Awaiting call back from nephro regarding paracentesis 13. ESRD   Recs per Nephro- appreciate Nephro note 14. Acute on chronic anemia  Hb 9.2 on 2/13   Cont to monitor 15. Leukocytosis, persistent   WBCs 11.6 on 2/13   Afebrile  Cont to monitor 16. ?Brain mass   Follow-up as an outpatient  LOS (Days) 17 A FACE TO FACE EVALUATION WAS PERFORMED  Ankit Lorie Phenix 07/10/2017 8:31 AM

## 2017-07-10 NOTE — Progress Notes (Signed)
Right BKA dressing changed today.  Small blister at midial part of incision.  Incision is closed.  No drainage this am. No signs of infection.  Continue wound care by keeping it dry.   I will see her back in 2 - 3 weeks.  D/c today   [name]

## 2017-07-10 NOTE — Progress Notes (Signed)
[facility] ROUNDING NOTE   Subjective:    Interval History: This is a 42 year old lady end stage renal disease that dialyzes MWF  She was admtted from [facility] 1/16 after falling CT head revealed hyperdense focus in left posterior parietal region suspicious for small hemorrhagic mass, reviewed   she had a very prolonged hospitalization in November and December with a  sacral decub and RLE ischemia requiring right metatarsal amputation with exploration of right groin wound 04/2017.  CT abdomen/pelvis done due to stage IV sacral decub and wound drainage and was positive for sacral osteomyelitis. 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 Trula Slade She continues to have VAC to right groin.   [name] is hoping to go home today  She can go to her regular dialysis in the AM    Objective:  Vital signs in last 24 hours:  Temp:  [97.8 F (36.6 C)-98.8 F (37.1 C)] 98.8 F (37.1 C) (02/14 0532) Pulse Rate:  [69-81] 81 (02/14 0532) Resp:  [16-24] 16 (02/14 0532) BP: (102-205)/(73-119) 183/89 (02/14 0532) SpO2:  [95 %-100 %] 99 % (02/14 0532) Weight:  [182 lb 15.7 oz (83 kg)] 182 lb 15.7 oz (83 kg) (02/14 0532)  Weight change: 4 lb 6.6 oz (2 kg) Filed Weights   07/07/17 1835 07/09/17 0500 07/10/17 0532  Weight: 176 lb 5.9 oz (80 kg) 178 lb 9.2 oz (81 kg) 182 lb 15.7 oz (83 kg)    Intake/Output: I/O last 3 completed shifts: In: 600 [P.O.:600] Out: 3001 [Other:3000; Stool:1]   Intake/Output this shift:  Total I/O In: 240 [P.O.:240] Out: -   awake and alert Regular S1S2 No S3 Lungs clear Abd tight but not taut(ascites) + BS. Not tender R groinwound not examined R BKA stump with 1+ edema  shrinker onand in immobilizer No LLE edema Sacral wounds coverednot examined Muscle wasting throughout Dialysis Access: left AVF + bruit      Basic Metabolic Panel: Recent Labs  Lab 07/04/17 1331 07/07/17 0519 07/09/17 1619  NA 135 137 135  K 4.8 4.7 4.8  CL 94* 96* 95*  CO2 26 25 26   GLUCOSE 96 135* 131*  BUN 44* 54* 47*  CREATININE 4.97* 5.93* 5.17*  CALCIUM 7.9* 7.9* 8.1*  PHOS 4.4  --  2.6    Liver Function Tests: Recent Labs  Lab 07/04/17 1331 07/09/17 1619  ALBUMIN 1.7* 1.7*   No results for input(s): LIPASE, AMYLASE in the last 168 hours. No results for input(s): AMMONIA in the last 168 hours.  CBC: Recent Labs  Lab 07/04/17 1331 07/07/17 0519 07/09/17 1619  WBC 12.5* 12.4* 11.6*  HGB 8.5* 8.7* 9.2*  HCT 28.0* 29.4* 30.1*  MCV 91.5 93.0 92.9  PLT 262 256 265    Cardiac Enzymes: No results for input(s): CKTOTAL, CKMB, CKMBINDEX, TROPONINI in the last 168 hours.  BNP: Invalid input(s): POCBNP  CBG: Recent Labs  Lab 07/09/17 1136 07/09/17 1852 07/09/17 2103 07/10/17 0541 07/10/17 0701  GLUCAP 71 113* 153* 171* 194*    Microbiology: Results for orders placed or performed during the hospital encounter of 06/23/17  Fungus Culture With Stain     Status: None   Collection Time: 06/24/17  3:39  PM  Result Value Ref Range Status   Fungus Stain Final report  Final    Comment: (NOTE) Performed At: [facility] [address], [place] [id number] Rush Farmer MD KT:[id number]    Fungus (Mycology) Culture PENDING  Incomplete   Fungal Source FLUID  Corrected    Comment: PERITONEAL CORRECTED ON 01/30 AT 4287: PREVIOUSLY REPORTED AS BLOOD   Culture, body fluid-bottle     Status: None   Collection Time: 06/24/17  3:39 PM  Result Value Ref Range Status   Specimen Description FLUID PERITONEAL  Final   Special Requests BOTTLES DRAWN AEROBIC AND ANAEROBIC 10CC  Final   Culture   Final    NO GROWTH 5 DAYS Performed at [facility], [place] [address], [place], [place] [address]    Report Status 06/30/2017 FINAL  Final  Gram stain     Status: None   Collection Time: 06/24/17  3:39 PM  Result Value Ref Range Status   Specimen Description FLUID PERITONEAL  Final   Special Requests NONE  Final   Gram Stain   Final    RARE WBC PRESENT, PREDOMINANTLY MONONUCLEAR NO ORGANISMS SEEN    Report Status 06/25/2017 FINAL  Final  Fungus Culture Result     Status: None   Collection Time: 06/24/17  3:39 PM  Result Value Ref Range Status   Result 1 Comment  Final    Comment: (NOTE) KOH/Calcofluor preparation:  no fungus observed. Performed At: [facility] [place], [place] [id number] Rush Farmer MD [id number]   Fungus Culture With Stain     Status: None (Preliminary result)   Collection Time: 07/01/17  3:18 PM  Result Value Ref Range Status   Fungus Stain Final report  Final    Comment: (NOTE) Performed At: [facility] [place], [place] [id number] Rush Farmer MD [id number]    Fungus (Mycology) Culture PENDING  Incomplete   Fungal Source FLUID  Final    Comment: PERITONEAL  Culture, body fluid-bottle     Status: None   Collection Time: 07/01/17  3:18 PM  Result Value Ref Range Status   Specimen Description FLUID PERITONEAL  Final   Special Requests NONE  Final   Culture   Final    NO GROWTH 5 DAYS Performed at [facility], [address] [place], [place] [address]    Report Status 07/07/2017 FINAL  Final  Gram stain     Status: None   Collection Time: 07/01/17  3:18 PM  Result Value Ref Range Status   Specimen Description PERITONEAL FLUID  Final   Special Requests NONE  Final   Gram Stain NO WBC SEEN NO ORGANISMS SEEN   Final   Report Status 07/02/2017 FINAL  Final  Fungus Culture Result     Status: None   Collection Time: 07/01/17  3:18 PM  Result Value Ref Range Status   Result 1 Comment  Final    Comment: (NOTE) KOH/Calcofluor  preparation:  no fungus observed. Performed At: [facility] [place], [place] [id number] Rush Farmer MD LK:[id number]     Coagulation Studies: No results for input(s): LABPROT, INR in the last 72 hours.  Urinalysis: No results for input(s): COLORURINE, LABSPEC, PHURINE, GLUCOSEU, HGBUR, BILIRUBINUR, KETONESUR, PROTEINUR, UROBILINOGEN, NITRITE, LEUKOCYTESUR in the last 72 hours.  Invalid input(s): APPERANCEUR    Imaging: No results found.   Medications:   . cefTAZidime (FORTAZ)  IV    . [  START ON 07/11/2017] vancomycin     . amLODipine  10 mg Oral QHS  . atorvastatin  40 mg Oral q1800  . cinacalcet  60 mg Oral Q supper  . cloNIDine  0.3 mg Oral TID  . darbepoetin (ARANESP) injection - DIALYSIS  200 mcg Intravenous Q Fri-HD  . docusate sodium  100 mg Oral TID  . fentaNYL  12.5 mcg Transdermal Q72H  . ferric citrate  420 mg Oral TID WC  . gabapentin  100 mg Oral BID  . heparin  5,000 Units Subcutaneous Q8H  . hydrALAZINE  100 mg Oral Q8H  . insulin aspart  0-9 Units Subcutaneous TID WC  . insulin glargine  10 Units Subcutaneous QHS  . insulin glargine  20 Units Subcutaneous Daily  . isosorbide dinitrate  60 mg Oral BID  . labetalol  300 mg Oral TID  . lidocaine      . metoCLOPramide  2.5 mg Oral TID AC  . multivitamin  1 tablet Oral QHS  . pantoprazole  40 mg Oral Daily  . protein supplement  1 scoop Oral TID WC  . sertraline  50 mg Oral Daily  . sevelamer carbonate  2,400 mg Oral TID WC   acetaminophen, aluminum hydroxide, bisacodyl, diphenhydrAMINE, guaiFENesin-dextromethorphan, lidocaine, LORazepam, oxyCODONE, phenol, polyethylene glycol, prochlorperazine **OR** prochlorperazine **OR** prochlorperazine, traMADol, traZODone  Assessment/ Plan:  Dialysis:MWF NW 4h 71min 85.5kg 2/2.25 L AVF No heparin for now due to small IC bleedby CT 1/16 (was on Hep 2000) -mircera 200 every 2 wks   Impression: 1Sacral decubdifficulty sitting  for HD due to sacral pain with question of ability to tolerate outpatient HD. CIR recommended due to functional deficits., not a flap candidate at present, follow up in wound clinic 2 BKA 3 R groin wound  Vanc/Ceftazidime x6 weeks after discussion 4 ESRD HD mwf, Plan dialysis  5 Fall / left parietal bleed, small 6 Ascites - periodic paracentesis (7.9 L para last wk 2/5) 7 Anemia ckd, darbe 200/ wk 8 HTN chron/ severe, normal SBP is 160 - 200,cont mult BP meds, avoid SBP < 140w HD if possible 9Volume wt's down after tap last wk, below dry 10 CAD 11 DM2  [name] appears to be doing well on rehab service and will continue her dialysis   Her day of discharge  Is today     LOS: 17 [name] W @TODAY @11 :06 AM

## 2017-07-10 NOTE — Procedures (Signed)
Ultrasound-guided diagnostic and therapeutic paracentesis performed yielding 5.8 liters of serous colored fluid. No immediate complications.  [name] 12:22 PM 07/10/2017

## 2017-07-10 NOTE — Discharge Instructions (Signed)
Inpatient Rehab Discharge Instructions  KALAYNA NOY Discharge date and time:  07/10/17  Activities/Precautions/ Functional Status: Activity: no lifting, driving, or strenuous exercise for till cleared by MD Diet: renal diet Wound Care: 1- Groin wound: Wash area with soap and water. Pat dry. No lotions, ointments or creams. Contact MD if you develop any problems with your incision/wound--redness, swelling, increase in pain, drainage or if you develop fever or chills.                          2- Sacral wound: Pack left ischial wound with gauze moistened with NS.  Top with dry gauze and secure with tape.Change twice daily and PRN soiling.  Functional status:  ___ No restrictions     ___ Walk up steps independently _X__ 24/7 supervision/assistance   ___ Walk up steps with assistance ___ Intermittent supervision/assistance  ___ Bathe/dress independently ___ Walk with walker     _X__ Bathe/dress with assistance ___ Walk Independently    ___ Shower independently ___ Walk with assistance    ___ Shower with assistance _X_ No alcohol     ___ Return to work/school ________   COMMUNITY REFERRALS UPON DISCHARGE:   Home Health:   PT     OT     RN     Agency:  [facility]     Phone:  [phone] Medical Equipment/Items Ordered:  30" slide board  Agency/Supplier:  [facility]       Phone:  [phone]  GENERAL COMMUNITY RESOURCES FOR PATIENT/FAMILY: Support Groups:  [facility] [facility] [place]                             Meets the second Tuesday of each month, 7-8:30 PM                             [facility]                              For more information, call [phone]  Special Instructions: 1. Keep Right knee extended out as much as possible. Continue to do home exercises at least 2-3 times a day. 2. Need to boost every 20 minutes when you are in the chair. 3. Need to wean pain medications to every 8 hours as needed in 5 days,  then again to every 12 hours as needed in another five days and then to off. 4. Can use Sportscreme to right thigh and knee for pain 3-4 times a day   My questions have been answered and I understand these instructions. I will adhere to these goals and the provided educational materials after my discharge from the hospital.  Patient/Caregiver Signature _______________________________ Date __________  Clinician Signature _______________________________________ Date __________  Please bring this form and your medication list with you to all your follow-up doctor's appointments.

## 2017-07-10 NOTE — Patient Care Conference (Signed)
Inpatient RehabilitationTeam Conference and Plan of Care Update Date: 07/09/2017   Time: 2:30 PM    Patient Name: [name]      Medical Record Number: [id number]  Date of Birth: 02-06-1976 Sex: Female         Room/Bed: 4M07C/4M07C-01 Payor Info: Payor: MEDICARE / Plan: MEDICARE PART A AND B / Product Type: *No Product type* /    Admitting Diagnosis: L BKA, hs mef  Admit Date/Time:  06/23/2017  4:23 PM Admission Comments: No comment available   Primary Diagnosis:  Unilateral complete BKA, right, initial encounter ([place]) Principal Problem: Unilateral complete BKA, right, initial encounter [facility])  Patient Active Problem List   Diagnosis Date Noted  . Labile blood glucose   . Hypertensive crisis   . Leukocytosis   . Hypoglycemia   . Postoperative pain   . Phantom limb pain ([place])   . Subacute osteomyelitis, other site ([place])   . Pain   . Diabetic osteomyelitis ([place])   . Acute blood loss anemia   . Anemia of chronic disease   . ESRD on dialysis ([place])   . Benign essential HTN   . Diabetes mellitus type 2 in nonobese (HCC)   . Unilateral complete BKA, right, initial encounter ([place]) 06/23/2017  . Post-operative pain   . Unilateral complete BKA, right, sequela ([place])   . Stage IV pressure ulcer of sacral region ([place]) 06/16/2017  . Wound healing, delayed   . Sacral osteomyelitis ([place])   . Chronic renal failure   . Brain mass 06/11/2017  . Fall 06/11/2017  . 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 History of ETT   . Pressure injury of skin 04/22/2017  . Acute respiratory failure with hypoxia ([place])   . Cardiac arrest, cause unspecified ([place])   . Non-healing ulcer ([place])   . Hypertension 03/24/2017  . Hypertensive urgency 03/24/2017  . Cellulitis in diabetic foot ([place]) 03/24/2017  . Cellulitis 03/24/2017  . Chronic pain 03/24/2017  . Diabetic ulcer of lower leg ([place]) 03/24/2017  . Gangrene of toe of right foot ([place])   . Acute on chronic diastolic  CHF (congestive heart failure) ([place]) 02/22/2016  . Fluid overload   . Pericardial effusion   . Ascites 02/15/2016  . Uncontrolled diabetes mellitus with end-stage renal disease ([place]) 02/16/2015  . Bimalleolar ankle fracture 02/12/2015  . CAD (coronary artery disease) 02/09/2015  . PAD (peripheral artery disease) ([place])   . Gastroparesis   . Chronic anemia   . Menorrhagia with regular cycle 01/04/2014  . Dysmenorrhea 01/04/2014  . Nausea & vomiting 10/16/2011  . History of noncompliance with medical treatment 05/09/2011  . Chronic UTI 05/09/2011  . CIN III (cervical intraepithelial neoplasia grade III) with severe dysplasia   . History of abscesses in groins 12/06/2010  . End stage renal disease on dialysis ([place])   . Coronary artery disease   . Hyperlipidemia   . 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 NONDEPENDENT TOBACCO USE DISORDER 12/20/2009  . Essential hypertension, benign 12/20/2009  . NEPHROTIC SYNDROME 12/20/2009    Expected Discharge Date: Expected Discharge Date: 07/10/17  Team Members Present: Physician leading conference: Dr. Delice Lesch Social Worker Present: Alfonse Alpers, LCSW Nurse Present: Rayetta Humphrey, RN PT Present: Roderic Ovens, PT OT Present: Simonne Come, OT SLP Present: Windell Moulding, SLP PPS Coordinator present : Daiva Nakayama, RN, CRRN     Current Status/Progress Goal Weekly Team Focus  Medical    Deficits with mobility, transfers, self-care secondary to  Right BKA and wounds.  Improve wounds, mobility, transfers, compliance, safety, ascities, osteo  See above   Bowel/Bladder   anuric on HD, incontinent of bowel, LBM 2/12,   maintain bowels with max assist, medication  monitor bowels  q shfit and prn, HD mon-wed-thurs.               Swallow/Nutrition/ Hydration             ADL's   min A transfers with board, min- mod A LB self care  min A overall   ADL training, UE strengthening, pt/family education   Mobility   supervision/min A  min A  d/c planning    Communication             Safety/Cognition/ Behavioral Observations            Pain   fentanyl patches 2x 12 mcg.  prn oxycodone and ultram, increased right stump pain on 2/12  pain <3 with min assist, medication  assess pain q 4 hours prn, assess for effectiveness of medication and need for breakthrough meds   Skin   stage 4 ischial wound with visible bone and osteomyelitis, NS wet to dry dressing BID.   right groin healed, R bka with non-adherent, gauze then kerlix daily.  maintain skin without further breakdown, no infection,   continue with dressing change to ischial wound bid, right  stump dressing daily.    Rehab Goals Patient on target to meet rehab goals: Yes Rehab Goals Revised: none *See Care Plan and progress notes for long and short-term goals.     Barriers to Discharge  Current Status/Progress Possible Resolutions Date Resolved   Physician    Medical stability;Decreased caregiver support;Lack of/limited family support;Hemodialysis;Medication compliance;Other (comments);IV antibiotics  Ascites  See above  Therapies, HD recs per [facility], Paracentesis as tolerated, follow labs, optimize pain meds, IV abx      Nursing                  PT                    OT                  SLP                SW                Discharge Planning/Teaching Needs:  Pt to return to her home with her husband and dtr to provide min A care.  Family has completed education and feels prepared to care for pt at home.   Team Discussion:  Pt is stable from a medical standpoint.  RN discussed pt's need for dressing changes at home.  CSW stated that pt would have [facility] RN for this, but if family is available, we should do some teaching.  Pt to have [facility] at home for PT/OT/RN.  PT had pt evaluated for a power chair.  Family education completed with PT and OT and pt has met goals.  Revisions to Treatment Plan:  none    Continued Need for Acute Rehabilitation Level of Care: The patient requires daily  medical management by a physician with specialized training in physical medicine and rehabilitation for the following conditions: Daily direction of a multidisciplinary physical rehabilitation program to ensure safe treatment while eliciting the highest outcome that is of practical value to the patient.: Yes Daily medical management of patient stability for increased activity during participation  in an intensive rehabilitation regime.: Yes Daily analysis of laboratory values and/or radiology reports with any subsequent need for medication adjustment of medical intervention for : Wound care problems;Diabetes problems;Blood pressure problems;Post surgical problems;Renal problems;Other  Tiffnay Bossi, Silvestre Mesi 07/10/2017, 2:27 PM

## 2017-07-10 NOTE — Progress Notes (Signed)
Social Work Discharge Note  The overall goal for the admission was met for:   Discharge location: Yes - home with husband and dtr  Length of Stay: Yes - 16 days  Discharge activity level: Yes - supervision w/c level  Home/community participation: Yes  Services provided included: MD, RD, PT, OT, SLP, RN, TR, Pharmacy, Neuropsych and SW  Financial Services: Medicare and Private Insurance: Blue Cross Blue Shield  Follow-up services arranged: Home Health: PT/OT/RN from [facility], DME: 30" slide board from [facility] and Patient/Family has no preference for HH/DME agencies  Comments (or additional information):  Pt to d/c to home with her husband and dtr to provide supervision.  Family education occurred.  Pt/husband felt ready to return home.  Power chair eval while pt was here and loaner to be delivered to home on Friday by [facility].  Family to obtain padded tub transfer bench with cutout on their own.  Amputee Support Group info given to pt.  Patient/Family verbalized understanding of follow-up arrangements: Yes  Individual responsible for coordination of the follow-up plan: pt with her husband to assist  Confirmed correct DME delivered: ,  [facility] 07/10/2017    ,  [facility] 

## 2017-07-10 NOTE — Progress Notes (Signed)
Patient refused her heparin injection again. Risks & benefits discussed. MD made aware.

## 2017-07-11 ENCOUNTER — Telehealth: Payer: Self-pay | Admitting: Registered Nurse

## 2017-07-11 ENCOUNTER — Telehealth: Payer: Self-pay | Admitting: Surgery

## 2017-07-11 ENCOUNTER — Other Ambulatory Visit: Payer: Self-pay

## 2017-07-11 DIAGNOSIS — E1122 Type 2 diabetes mellitus with diabetic chronic kidney disease: Secondary | ICD-10-CM | POA: Diagnosis not present

## 2017-07-11 DIAGNOSIS — N186 End stage renal disease: Secondary | ICD-10-CM | POA: Diagnosis not present

## 2017-07-11 DIAGNOSIS — I5032 Chronic diastolic (congestive) heart failure: Secondary | ICD-10-CM | POA: Diagnosis not present

## 2017-07-11 DIAGNOSIS — A499 Bacterial infection, unspecified: Secondary | ICD-10-CM | POA: Diagnosis not present

## 2017-07-11 DIAGNOSIS — Z4781 Encounter for orthopedic aftercare following surgical amputation: Secondary | ICD-10-CM | POA: Diagnosis not present

## 2017-07-11 DIAGNOSIS — D649 Anemia, unspecified: Secondary | ICD-10-CM | POA: Diagnosis not present

## 2017-07-11 DIAGNOSIS — I132 Hypertensive heart and chronic kidney disease with heart failure and with stage 5 chronic kidney disease, or end stage renal disease: Secondary | ICD-10-CM | POA: Diagnosis not present

## 2017-07-11 DIAGNOSIS — L89154 Pressure ulcer of sacral region, stage 4: Secondary | ICD-10-CM | POA: Diagnosis not present

## 2017-07-11 DIAGNOSIS — M8618 Other acute osteomyelitis, other site: Secondary | ICD-10-CM | POA: Diagnosis not present

## 2017-07-11 NOTE — Telephone Encounter (Signed)
Sched appt 08/04/17 at 3:15. Lm on cell# to inform pt of appt.

## 2017-07-11 NOTE — Telephone Encounter (Signed)
Transitional Care call  Transitional Care Call Completed, Appointment Confirmed, Address Confirmed, New Patient Packet Mailed  Patient name: [name] DOB: Sep 26, 1975 1. Are you/is patient experiencing any problems since coming home? No a. Are there any questions regarding any aspect of care? No 2. Are there any questions regarding medications administration/dosing? No a. Are meds being taken as prescribed? Yes 3. "Patient should review meds with caller to confirm" Medication List Reviewed 4. Have there been any falls? No 5. Has Home Health been to the house and/or have they contacted you? No: Awaiting call from [facility] a. If not, have you tried to contact them? No b. Can we help you contact them? No 6. Are bowels and bladder emptying properly? () a. Are there any unexpected incontinence issues? () b. If applicable, is patient following bowel/bladder programs? Yes: Bowels/ ESRD 7. Any fevers, problems with breathing, unexpected pain? No 8. Are there any skin problems or new areas of breakdown? No 9. Has the patient/family member arranged specialty MD follow up (ie cardiology/neurology/renal/surgical/etc.)?  In the Process a. Can we help arrange? No 10. Does the patient need any other services or support that we can help arrange? No 11. Are caregivers following through as expected in assisting the patient? Yes 12. Has the patient quit smoking, drinking alcohol, or using drugs as recommended? Ms. Bernard denies smoking, drinking alcohol or using illicit drugs.  Appointment date/time 07/18/2017 arrival time 2:45 pm for 3:00 appointment with Dr. Posey Pronto , at [address] [phone]

## 2017-07-11 NOTE — Telephone Encounter (Signed)
-----   Message from Mena Goes, RN sent at 07/10/2017 10:22 AM EST ----- Regarding: 2-4 weeks   ----- Message ----- From: Serafina Mitchell, MD Sent: 07/10/2017   8:15 AM To: [facility]vs Charge Pool  07-10-2017:  Needs f/u in 2-4 weeks

## 2017-07-11 NOTE — Patient Outreach (Signed)
[name]  07/11/2017  [name] 09-Jan-1976 [id number]  Subjective: client reports she is doing alright, she denies any problems or concerns.  Objective: none  Assessment: 42 year old with history of brain mass, fall, ESRD/HD, sacral osteomylitis,HTN, DM, heart failure, hypertensive crisis, recurrent abdonimal ascities, cirrhosis.  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 Admission 1/16-1/28 -disposition rehabilitation. Admission 1/28 to inpatient rehabilitation and discharged to home on 07/10/17.  RNCM called to complete transition of care. Client reports her husband and daughter are home with her and states they are available to assist as needed.   She reports the home health agency has called her and has scheduled to come see her. She is not able to state the name of the agency at this time. She states she is going to call to scehdule her follow up appointments today.  Medications reviewed. Client reports she has all the medication that was prescribed. She denies any difficulties with medication management.   Plan: home visit next week.  Thea Silversmith, RN, MSN, [place] Coordinator Cell: [phone]

## 2017-07-14 DIAGNOSIS — N186 End stage renal disease: Secondary | ICD-10-CM | POA: Diagnosis not present

## 2017-07-14 DIAGNOSIS — D649 Anemia, unspecified: Secondary | ICD-10-CM | POA: Diagnosis not present

## 2017-07-14 DIAGNOSIS — A499 Bacterial infection, unspecified: Secondary | ICD-10-CM | POA: Diagnosis not present

## 2017-07-14 LAB — PATHOLOGIST SMEAR REVIEW

## 2017-07-15 ENCOUNTER — Other Ambulatory Visit: Payer: Self-pay

## 2017-07-15 DIAGNOSIS — Z4781 Encounter for orthopedic aftercare following surgical amputation: Secondary | ICD-10-CM | POA: Diagnosis not present

## 2017-07-15 DIAGNOSIS — M8618 Other acute osteomyelitis, other site: Secondary | ICD-10-CM | POA: Diagnosis not present

## 2017-07-15 DIAGNOSIS — L89154 Pressure ulcer of sacral region, stage 4: Secondary | ICD-10-CM | POA: Diagnosis not present

## 2017-07-15 DIAGNOSIS — I5032 Chronic diastolic (congestive) heart failure: Secondary | ICD-10-CM | POA: Diagnosis not present

## 2017-07-15 DIAGNOSIS — E1122 Type 2 diabetes mellitus with diabetic chronic kidney disease: Secondary | ICD-10-CM | POA: Diagnosis not present

## 2017-07-15 DIAGNOSIS — I132 Hypertensive heart and chronic kidney disease with heart failure and with stage 5 chronic kidney disease, or end stage renal disease: Secondary | ICD-10-CM | POA: Diagnosis not present

## 2017-07-15 LAB — CULTURE, BODY FLUID W GRAM STAIN -BOTTLE: Culture: NO GROWTH

## 2017-07-15 LAB — CULTURE, BODY FLUID-BOTTLE

## 2017-07-15 NOTE — Patient Outreach (Addendum)
[facility]) Care Management   07/15/2017  [name] 26-Dec-1975 [id number]  [name] is an 42 y.o. female  Subjective: client reports feeling better, she reports home health nurse just left.  Objective:  BP (!) 180/80   Pulse 73   Ht 1.93 m (6\' 4" ) Comment: patient reported.  Wt 175 lb (79.4 kg) Comment: patient reported  LMP 10/09/2015 Comment: pt on dialysis  SpO2 96%   BMI 21.30 kg/m   Review of Systems  Respiratory:       Lungs clear bilaterally  Cardiovascular:       S1S2noted, regular, trace edema noted to left ankle.  Skin:       Dressing wrapped, clean dry intact to right stump.    Physical Exam skin color within normal limits  Encounter Medications:   Outpatient Encounter Medications as of 07/15/2017  Medication Sig Note  . acetaminophen (TYLENOL) 325 MG tablet Take 1-2 tablets (325-650 mg total) by mouth every 4 (four) hours as needed for mild pain.   Marland Kitchen amLODipine (NORVASC) 10 MG tablet Take 1 tablet (10 mg total) by mouth daily after supper.   Marland Kitchen atorvastatin (LIPITOR) 40 MG tablet Take 1 tablet (40 mg total) by mouth daily at 6 PM.   . b complex-vitamin c-folic acid (NEPHRO-VITE) 0.8 MG TABS tablet Take 1 tablet by mouth daily.   . cefTAZidime 2 g in sodium chloride 0.9 % 100 mL Inject 2 g into the vein every Monday, Wednesday, and Friday with hemodialysis.   Marland Kitchen cinacalcet (SENSIPAR) 30 MG tablet Take 2 tablets (60 mg total) by mouth daily with supper.   . cloNIDine (CATAPRES) 0.3 MG tablet Take 1 tablet (0.3 mg total) by mouth 3 (three) times daily.   . Darbepoetin Alfa (ARANESP, ALBUMIN FREE,) 200 MCG/ML SOLN Inject 200 mg as directed. Give at dialysis on Wednesday   . ferric citrate (AURYXIA) 1 GM 210 MG(Fe) tablet Take 2 tablets (420 mg total) by mouth 3 (three) times daily with meals.   . gabapentin (NEURONTIN) 100 MG capsule Take 1 capsule (100 mg total) by mouth 2 (two) times daily.   . hydrALAZINE (APRESOLINE) 100 MG tablet  Take 1 tablet (100 mg total) by mouth every 8 (eight) hours.   . Insulin Glargine (LANTUS SOLOSTAR) 100 UNIT/ML Solostar Pen Inject 10 Units into the skin daily at 10 pm. 07/15/2017: Reports she is taking 10 units in the morning and 10 units at night.  . isosorbide dinitrate (ISORDIL) 30 MG tablet Take 2 tablets (60 mg total) by mouth 2 (two) times daily. Give two 30 mg tablets to = 60 mg BID 07/15/2017: States she is taking one tablet twice/day.  . labetalol (NORMODYNE) 300 MG tablet Take 1 tablet (300 mg total) by mouth 3 (three) times daily.   Marland Kitchen LORazepam (ATIVAN) 0.5 MG tablet Take 1 tablet (0.5 mg total) by mouth every 12 (twelve) hours as needed for anxiety.   . metoCLOPramide (REGLAN) 5 MG tablet Take 0.5 tablets (2.5 mg total) by mouth 3 (three) times daily before meals.   . multivitamin (RENA-VIT) TABS tablet Take 1 tablet by mouth at bedtime.   . Nutritional Supplements (FEEDING SUPPLEMENT, NEPRO CARB STEADY,) LIQD Take 237 mLs by mouth 2 (two) times daily between meals.   Marland Kitchen oxyCODONE 10 MG TABS Take 0.5-1 tablets (5-10 mg total) by mouth every 8 (eight) hours as needed for severe pain.   . pantoprazole (PROTONIX) 40 MG tablet Take 1 tablet (40 mg total)  by mouth daily.   . sevelamer carbonate (RENVELA) 800 MG tablet Take 3 tablets (2,400 mg total) by mouth 3 (three) times daily with meals. Give 3 tablets to = 2400 mg TID   . traMADol (ULTRAM) 50 MG tablet Take 1 tablet (50 mg total) by mouth every 6 (six) hours as needed for moderate pain.   . vancomycin (VANCOCIN) 1-5 GM/200ML-% SOLN Inject 200 mLs (1,000 mg total) into the vein every Monday, Wednesday, and Friday with hemodialysis.   Marland Kitchen docusate (COLACE) 50 MG/5ML liquid Take 100 mg by mouth 3 (three) times daily.   . Insulin Glargine (LANTUS SOLOSTAR) 100 UNIT/ML Solostar Pen Inject 20 Units into the skin every morning.   . ondansetron (ZOFRAN) 4 MG tablet Take 4 mg by mouth every 6 (six) hours as needed for nausea or vomiting.   .  sertraline (ZOLOFT) 50 MG tablet Take 1 tablet (50 mg total) by mouth daily. (Patient not taking: Reported on 07/15/2017)    No facility-administered encounter medications on file as of 07/15/2017.     Functional Status:   In your present state of health, do you have any difficulty performing the following activities: 07/15/2017 06/23/2017  Hearing? N N  Vision? N N  Difficulty concentrating or making decisions? N N  Walking or climbing stairs? Y Y  Dressing or bathing? Y Y  Doing errands, shopping? Tempie Donning  Preparing Food and eating ? N -  Using the Toilet? N -  In the past six months, have you accidently leaked urine? N -  Do you have problems with loss of bowel control? N -  Managing your Medications? Y -  Managing your Finances? N -  Housekeeping or managing your Housekeeping? Y -  Some recent data might be hidden    Fall/Depression Screening:    Fall Risk  07/15/2017 06/15/2017  Falls in the past year? No Yes  Number falls in past yr: - 1  Injury with Fall? - Yes  Risk Factor Category  - High Fall Risk  Risk for fall due to : - History of fall(s)  Follow up - Falls prevention discussed   PHQ 2/9 Scores 07/15/2017 06/15/2017 03/04/2015  PHQ - 2 Score 1 0 0    Assessment:  42 year old with history of brain mass, fall, ESRD/HD, sacral osteomylitis,HTN, DM, heart failure, hypertensive crisis, recurrent abdonimal ascities, cirrhosis.  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 Admission 1/16-1/28 -disposition rehabilitation. Admission 1/28 to inpatient rehabilitation and discharged to home on 07/10/17.  RNCM completed home visit. Client was sitting up in chair, voicing no complaints. She reports home health nurse just left.   Medications reviewed. Client reports she is taking Isordil one tablet twice a day. The bottle reads 1-2 tablets and her discharge instructions read take two tablets twice/day. RNCM will send referral to pharmacist regarding medication review/reconciliation. Client reports her  usual blood pressure is 180/80.  Client reports history of wound to her groin which she reports is healed. She also reports sacral wound. She states that is healing. She reports home health nurse is changing the dressing and monitoring her sacral wound. Client prefers RNCM not check sacral wound as dressing was just changed and home health nurse monitoring this. Dressing to right BKA also monitored and changed per home health nurse ([place]).  Plan: update primary care, pharmacy referral, telephonic follow up next week. [facility] CM Care Plan Problem One     Most Recent Value  Care Plan Problem One  at risk for readmission as evidence by recent discharge from hospital/rehabilitaiton, mulitple comoritities.  Role Documenting the Problem One  Care Management [place] for Problem One  Active  [facility] Long Term Goal   client will not be readmitted within the next 31-45 days.  [facility] Long Term Goal Start Date  07/11/17  Interventions for Problem One Long Term Goal  reviewed upcoming appointments, pharmacy referral for medication reviewe and medication reconciliation.  [facility] CM Short Term Goal #1   Client will verbalize scheduled follow up appointments within the next 1-2 weeks.  [facility] CM Short Term Goal #1 Start Date  07/11/17  Interventions for Short Term Goal #1  encouraged client to call to schedule follow up with neurosurgeon, reinforced the importance of follow up with all her providers.  [facility] CM Short Term Goal #2   Client will call RNCM with questions, concerns as needed within the next 30 days.  [facility] CM Short Term Goal #2 Start Date  07/11/17  Interventions for Short Term Goal #2  confirmed client has RNCM's contact number, as well as homehealth contact number.      Thea Silversmith, RN, MSN, [place] Coordinator Cell: [phone]

## 2017-07-16 DIAGNOSIS — I5032 Chronic diastolic (congestive) heart failure: Secondary | ICD-10-CM | POA: Diagnosis not present

## 2017-07-16 DIAGNOSIS — Z4781 Encounter for orthopedic aftercare following surgical amputation: Secondary | ICD-10-CM | POA: Diagnosis not present

## 2017-07-16 DIAGNOSIS — A499 Bacterial infection, unspecified: Secondary | ICD-10-CM | POA: Diagnosis not present

## 2017-07-16 DIAGNOSIS — D649 Anemia, unspecified: Secondary | ICD-10-CM | POA: Diagnosis not present

## 2017-07-16 DIAGNOSIS — L89154 Pressure ulcer of sacral region, stage 4: Secondary | ICD-10-CM | POA: Diagnosis not present

## 2017-07-16 DIAGNOSIS — E1122 Type 2 diabetes mellitus with diabetic chronic kidney disease: Secondary | ICD-10-CM | POA: Diagnosis not present

## 2017-07-16 DIAGNOSIS — M8618 Other acute osteomyelitis, other site: Secondary | ICD-10-CM | POA: Diagnosis not present

## 2017-07-16 DIAGNOSIS — N186 End stage renal disease: Secondary | ICD-10-CM | POA: Diagnosis not present

## 2017-07-16 DIAGNOSIS — I132 Hypertensive heart and chronic kidney disease with heart failure and with stage 5 chronic kidney disease, or end stage renal disease: Secondary | ICD-10-CM | POA: Diagnosis not present

## 2017-07-17 DIAGNOSIS — I132 Hypertensive heart and chronic kidney disease with heart failure and with stage 5 chronic kidney disease, or end stage renal disease: Secondary | ICD-10-CM | POA: Diagnosis not present

## 2017-07-17 DIAGNOSIS — I5032 Chronic diastolic (congestive) heart failure: Secondary | ICD-10-CM | POA: Diagnosis not present

## 2017-07-17 DIAGNOSIS — Z4781 Encounter for orthopedic aftercare following surgical amputation: Secondary | ICD-10-CM | POA: Diagnosis not present

## 2017-07-17 DIAGNOSIS — L89154 Pressure ulcer of sacral region, stage 4: Secondary | ICD-10-CM | POA: Diagnosis not present

## 2017-07-17 DIAGNOSIS — M8618 Other acute osteomyelitis, other site: Secondary | ICD-10-CM | POA: Diagnosis not present

## 2017-07-17 DIAGNOSIS — E1122 Type 2 diabetes mellitus with diabetic chronic kidney disease: Secondary | ICD-10-CM | POA: Diagnosis not present

## 2017-07-18 ENCOUNTER — Encounter: Payer: Medicare Other | Admitting: Physical Medicine & Rehabilitation

## 2017-07-18 DIAGNOSIS — A499 Bacterial infection, unspecified: Secondary | ICD-10-CM | POA: Diagnosis not present

## 2017-07-18 DIAGNOSIS — N186 End stage renal disease: Secondary | ICD-10-CM | POA: Diagnosis not present

## 2017-07-18 DIAGNOSIS — D649 Anemia, unspecified: Secondary | ICD-10-CM | POA: Diagnosis not present

## 2017-07-21 DIAGNOSIS — D649 Anemia, unspecified: Secondary | ICD-10-CM | POA: Diagnosis not present

## 2017-07-21 DIAGNOSIS — A499 Bacterial infection, unspecified: Secondary | ICD-10-CM | POA: Diagnosis not present

## 2017-07-21 DIAGNOSIS — N186 End stage renal disease: Secondary | ICD-10-CM | POA: Diagnosis not present

## 2017-07-22 ENCOUNTER — Ambulatory Visit (INDEPENDENT_AMBULATORY_CARE_PROVIDER_SITE_OTHER): Payer: Medicare Other | Admitting: Infectious Diseases

## 2017-07-22 ENCOUNTER — Encounter: Payer: Self-pay | Admitting: Infectious Diseases

## 2017-07-22 ENCOUNTER — Other Ambulatory Visit: Payer: Self-pay

## 2017-07-22 ENCOUNTER — Telehealth: Payer: Self-pay

## 2017-07-22 DIAGNOSIS — I2510 Atherosclerotic heart disease of native coronary artery without angina pectoris: Secondary | ICD-10-CM | POA: Diagnosis not present

## 2017-07-22 DIAGNOSIS — IMO0002 Reserved for concepts with insufficient information to code with codable children: Secondary | ICD-10-CM

## 2017-07-22 DIAGNOSIS — L89154 Pressure ulcer of sacral region, stage 4: Secondary | ICD-10-CM

## 2017-07-22 DIAGNOSIS — M8618 Other acute osteomyelitis, other site: Secondary | ICD-10-CM | POA: Diagnosis not present

## 2017-07-22 DIAGNOSIS — Z992 Dependence on renal dialysis: Secondary | ICD-10-CM | POA: Diagnosis not present

## 2017-07-22 DIAGNOSIS — E1322 Other specified diabetes mellitus with diabetic chronic kidney disease: Secondary | ICD-10-CM | POA: Diagnosis not present

## 2017-07-22 DIAGNOSIS — I132 Hypertensive heart and chronic kidney disease with heart failure and with stage 5 chronic kidney disease, or end stage renal disease: Secondary | ICD-10-CM | POA: Diagnosis not present

## 2017-07-22 DIAGNOSIS — E1365 Other specified diabetes mellitus with hyperglycemia: Secondary | ICD-10-CM | POA: Diagnosis not present

## 2017-07-22 DIAGNOSIS — N186 End stage renal disease: Secondary | ICD-10-CM | POA: Diagnosis not present

## 2017-07-22 DIAGNOSIS — E1122 Type 2 diabetes mellitus with diabetic chronic kidney disease: Secondary | ICD-10-CM | POA: Diagnosis not present

## 2017-07-22 DIAGNOSIS — I5032 Chronic diastolic (congestive) heart failure: Secondary | ICD-10-CM | POA: Diagnosis not present

## 2017-07-22 DIAGNOSIS — Z4781 Encounter for orthopedic aftercare following surgical amputation: Secondary | ICD-10-CM | POA: Diagnosis not present

## 2017-07-22 NOTE — Patient Outreach (Signed)
[place] [facility]) Care Management  07/22/2017  [name] 08-27-1975 [id number]  Subjective: "I am actually at my doctor's right now".  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 Admission 1/16-1/28 -disposition rehabilitation. Admission 1/28 to inpatient rehabilitation and discharged to home on 07/10/17.  RNCM called for transition of care. [name] states she is at a provider appointment now and will call RNCM after her visit.  Plan: continue to follow.  Thea Silversmith, RN, MSN, [place] Coordinator Cell: [phone]

## 2017-07-22 NOTE — Progress Notes (Signed)
   Subjective:    Patient ID: ANWITA MENCER, female    DOB: 03/25/1976, 42 y.o.   MRN: [id number]  HPI 42 yo F with hx of DM1 (dx 42yo), ESRD, CAD with prev NSTEMI, grade 3 diastolic CHF, as well as restrictive cardiomyopathy,  R transmet amputation  She also has a significant hx of PVD- right CFA, profunda, SFA and external iliac endarterectomy with bovine patch angioplasty on 04/16/17  Hospitalization complicated by cardiac arrest, revascularization, then to [facility], then SNF.    She was adm to hospital on 06-12-17 with stage IV pressure injury L ischium, pressure injury R heel, and full thickness R groin wound. Her transmet site was noted to be non-healing as well.  These wounds were eval by WOC, noted to be: Right groin; 6cm x 2cm x unknow (not able to measure depth today) Right transmet site incision closed with minimal skin separation but with skin necrosis and eschar that is 10cm x 5cm x 0cm with extension of color changes an additional 4 cm on the plantar surface Right achilles: 2cm x 2.5cm x 0cm Left 3rd toe: 0.2cm 0.2cm 0cm  Left ischium: 8cm x 4cm x 3.5cm  She was also noted to have hemorrhagic brain mass on CT, was eval by Neurosurgery.  She was treated with vanco/zosyn in hospital, on 1-22 she underwent R BKA for her non-healing transmet wound. She had CT pelvis showing osteo. Her anbx were stopped on 1-23 and she was d/c to rehab on 1-28.  She was given air mattress, protein supplementation, wound care. She was eval by plastic surgery and was not felt to need further debridement.  She was d/c to home on 2-14.  She states she is getting her anbx at HD (vanco/ceftaz), they were resumed on 1-29 after I was called by rehab.  Today is day 10 Per husband, wound is healing well on her ischium.  R groin wound is "completely done". BKA site is still healing, pending use of vasoline dressings.    The past medical history, family history and social history were reviewed/updated in  EPIC  No problems with HD.  FSG have been "runing pretty good". Avg 170-180. A1C was 5.4% (06-12-17).  She has home health RN biw  (tu-thurs) Review of Systems  Constitutional: Negative for appetite change, chills and fever.  Eyes: Negative for visual disturbance.  Gastrointestinal: Negative for constipation and diarrhea.  Genitourinary: Negative for difficulty urinating (urinates very little).  Neurological: Negative for numbness.  Please see HPI. All other systems reviewed and negative.      Objective:   Physical Exam  Constitutional: She appears well-developed and well-nourished.  HENT:  Mouth/Throat: No oropharyngeal exudate.  Eyes: EOM are normal. Pupils are equal, round, and reactive to light.  Neck: Neck supple.  Cardiovascular: Normal rate, regular rhythm and normal heart sounds.  Pulmonary/Chest: Effort normal and breath sounds normal.  Abdominal: Soft. Bowel sounds are normal. There is no tenderness. There is no rebound.  Musculoskeletal:       Back:  Lymphadenopathy:    She has no cervical adenopathy.  Skin:              Assessment & Plan:

## 2017-07-22 NOTE — Telephone Encounter (Signed)
Per Dr. Johnnye Sima I called [facility] with a verbal order to discontinue the pt's antibiotics on 08/04/17. I spoke with the RN there who stated that the their records show that they already have the antibiotics end date for 08/04/17. I will relay this back to Dr. Johnnye Sima. [place]

## 2017-07-22 NOTE — Assessment & Plan Note (Signed)
Follows with Dr Lorrene Reid. Appreciate her excellent care.

## 2017-07-22 NOTE — Assessment & Plan Note (Signed)
Her Dm is better controlled.  Will need to f/u, appreciate care of her PCP.

## 2017-07-22 NOTE — Assessment & Plan Note (Signed)
Her wound is doing well Continue anbx for additional 13 days Will call HD ([facility]) and have them stop on 08-04-17 She will f/u with WOC Will see her back in 3 weeks.

## 2017-07-23 ENCOUNTER — Other Ambulatory Visit: Payer: Self-pay | Admitting: Pharmacist

## 2017-07-23 DIAGNOSIS — D649 Anemia, unspecified: Secondary | ICD-10-CM | POA: Diagnosis not present

## 2017-07-23 DIAGNOSIS — A499 Bacterial infection, unspecified: Secondary | ICD-10-CM | POA: Diagnosis not present

## 2017-07-23 DIAGNOSIS — N186 End stage renal disease: Secondary | ICD-10-CM | POA: Diagnosis not present

## 2017-07-23 LAB — FUNGUS CULTURE WITH STAIN

## 2017-07-23 LAB — FUNGAL ORGANISM REFLEX

## 2017-07-23 LAB — FUNGUS CULTURE RESULT

## 2017-07-24 ENCOUNTER — Encounter: Payer: Self-pay | Admitting: Pharmacist

## 2017-07-24 ENCOUNTER — Encounter (HOSPITAL_BASED_OUTPATIENT_CLINIC_OR_DEPARTMENT_OTHER): Payer: Medicare Other | Attending: Internal Medicine

## 2017-07-24 DIAGNOSIS — E1051 Type 1 diabetes mellitus with diabetic peripheral angiopathy without gangrene: Secondary | ICD-10-CM | POA: Insufficient documentation

## 2017-07-24 DIAGNOSIS — Z794 Long term (current) use of insulin: Secondary | ICD-10-CM | POA: Insufficient documentation

## 2017-07-24 DIAGNOSIS — E1040 Type 1 diabetes mellitus with diabetic neuropathy, unspecified: Secondary | ICD-10-CM | POA: Diagnosis not present

## 2017-07-24 DIAGNOSIS — D631 Anemia in chronic kidney disease: Secondary | ICD-10-CM | POA: Diagnosis not present

## 2017-07-24 DIAGNOSIS — I5032 Chronic diastolic (congestive) heart failure: Secondary | ICD-10-CM | POA: Diagnosis not present

## 2017-07-24 DIAGNOSIS — Z4781 Encounter for orthopedic aftercare following surgical amputation: Secondary | ICD-10-CM | POA: Diagnosis not present

## 2017-07-24 DIAGNOSIS — E1022 Type 1 diabetes mellitus with diabetic chronic kidney disease: Secondary | ICD-10-CM | POA: Insufficient documentation

## 2017-07-24 DIAGNOSIS — L89154 Pressure ulcer of sacral region, stage 4: Secondary | ICD-10-CM | POA: Insufficient documentation

## 2017-07-24 DIAGNOSIS — Z87891 Personal history of nicotine dependence: Secondary | ICD-10-CM | POA: Diagnosis not present

## 2017-07-24 DIAGNOSIS — L97522 Non-pressure chronic ulcer of other part of left foot with fat layer exposed: Secondary | ICD-10-CM | POA: Diagnosis not present

## 2017-07-24 DIAGNOSIS — N186 End stage renal disease: Secondary | ICD-10-CM | POA: Insufficient documentation

## 2017-07-24 DIAGNOSIS — I2510 Atherosclerotic heart disease of native coronary artery without angina pectoris: Secondary | ICD-10-CM | POA: Diagnosis not present

## 2017-07-24 DIAGNOSIS — E10621 Type 1 diabetes mellitus with foot ulcer: Secondary | ICD-10-CM | POA: Insufficient documentation

## 2017-07-24 DIAGNOSIS — E10610 Type 1 diabetes mellitus with diabetic neuropathic arthropathy: Secondary | ICD-10-CM | POA: Insufficient documentation

## 2017-07-24 DIAGNOSIS — Z89511 Acquired absence of right leg below knee: Secondary | ICD-10-CM | POA: Insufficient documentation

## 2017-07-24 DIAGNOSIS — I132 Hypertensive heart and chronic kidney disease with heart failure and with stage 5 chronic kidney disease, or end stage renal disease: Secondary | ICD-10-CM | POA: Diagnosis not present

## 2017-07-24 DIAGNOSIS — Z992 Dependence on renal dialysis: Secondary | ICD-10-CM | POA: Diagnosis not present

## 2017-07-24 DIAGNOSIS — E1122 Type 2 diabetes mellitus with diabetic chronic kidney disease: Secondary | ICD-10-CM | POA: Diagnosis not present

## 2017-07-24 DIAGNOSIS — M8618 Other acute osteomyelitis, other site: Secondary | ICD-10-CM | POA: Diagnosis not present

## 2017-07-24 NOTE — Patient Outreach (Signed)
[place] [facility]) Care Management  [place]   07/23/2017  [name] June 09, 1975 [id number]  Subjective: Patient was called regarding post discharge medication review. HIPAA identifiers were obtained. Patient is a 42 yo female with multiple medical conditions including but not limited to:  CAD, ESRD on hemodialysis, type 2 diabetes,, right foot wounds s/p revascularization,  sacral decubitus, RLE ischemia requiring right metatarsal amputation, groin wounds (osteomyelitis), ascites, and gastroparesis.  Patient was hospitalized 06/22/17-07/10/17 for inpatient rehabilitation.  At the time of the call, the patient said she would review her medications by memory because she was in the bed and was not close to her medications.     Objective:   Encounter Medications: Outpatient Encounter Medications as of 07/23/2017  Medication Sig Note  . amLODipine (NORVASC) 10 MG tablet Take 1 tablet (10 mg total) by mouth daily after supper.   . cinacalcet (SENSIPAR) 30 MG tablet Take 2 tablets (60 mg total) by mouth daily with supper.   . Darbepoetin Alfa (ARANESP, ALBUMIN FREE,) 200 MCG/ML SOLN Inject 200 mg as directed. Give at dialysis on Wednesday   . ferric citrate (AURYXIA) 1 GM 210 MG(Fe) tablet Take 2 tablets (420 mg total) by mouth 3 (three) times daily with meals.   Marland Kitchen acetaminophen (TYLENOL) 325 MG tablet Take 1-2 tablets (325-650 mg total) by mouth every 4 (four) hours as needed for mild pain.   Marland Kitchen atorvastatin (LIPITOR) 40 MG tablet Take 1 tablet (40 mg total) by mouth daily at 6 PM.   . b complex-vitamin c-folic acid (NEPHRO-VITE) 0.8 MG TABS tablet Take 1 tablet by mouth daily.   . cefTAZidime 2 g in sodium chloride 0.9 % 100 mL Inject 2 g into the vein every Monday, Wednesday, and Friday with hemodialysis.   . cloNIDine (CATAPRES) 0.3 MG tablet Take 1 tablet (0.3 mg total) by mouth 3 (three) times daily.   Marland Kitchen docusate (COLACE) 50 MG/5ML liquid Take 100 mg by mouth 3  (three) times daily.   Marland Kitchen gabapentin (NEURONTIN) 100 MG capsule Take 1 capsule (100 mg total) by mouth 2 (two) times daily.   . hydrALAZINE (APRESOLINE) 100 MG tablet Take 1 tablet (100 mg total) by mouth every 8 (eight) hours.   . Insulin Glargine (LANTUS SOLOSTAR) 100 UNIT/ML Solostar Pen Inject 20 Units into the skin every morning.   . Insulin Glargine (LANTUS SOLOSTAR) 100 UNIT/ML Solostar Pen Inject 10 Units into the skin daily at 10 pm. 07/15/2017: Reports she is taking 10 units in the morning and 10 units at night.  . isosorbide dinitrate (ISORDIL) 30 MG tablet Take 2 tablets (60 mg total) by mouth 2 (two) times daily. Give two 30 mg tablets to = 60 mg BID 07/15/2017: States she is taking one tablet twice/day.  . labetalol (NORMODYNE) 300 MG tablet Take 1 tablet (300 mg total) by mouth 3 (three) times daily.   Marland Kitchen LORazepam (ATIVAN) 0.5 MG tablet Take 1 tablet (0.5 mg total) by mouth every 12 (twelve) hours as needed for anxiety.   . metoCLOPramide (REGLAN) 5 MG tablet Take 0.5 tablets (2.5 mg total) by mouth 3 (three) times daily before meals.   . multivitamin (RENA-VIT) TABS tablet Take 1 tablet by mouth at bedtime.   . Nutritional Supplements (FEEDING SUPPLEMENT, NEPRO CARB STEADY,) LIQD Take 237 mLs by mouth 2 (two) times daily between meals.   . ondansetron (ZOFRAN) 4 MG tablet Take 4 mg by mouth every 6 (six) hours as needed for nausea or vomiting.   Marland Kitchen  oxyCODONE 10 MG TABS Take 0.5-1 tablets (5-10 mg total) by mouth every 8 (eight) hours as needed for severe pain.   . pantoprazole (PROTONIX) 40 MG tablet Take 1 tablet (40 mg total) by mouth daily.   . sertraline (ZOLOFT) 50 MG tablet Take 1 tablet (50 mg total) by mouth daily. (Patient not taking: Reported on 07/15/2017)   . sevelamer carbonate (RENVELA) 800 MG tablet Take 3 tablets (2,400 mg total) by mouth 3 (three) times daily with meals. Give 3 tablets to = 2400 mg TID   . traMADol (ULTRAM) 50 MG tablet Take 1 tablet (50 mg total) by mouth  every 6 (six) hours as needed for moderate pain.   . vancomycin (VANCOCIN) 1-5 GM/200ML-% SOLN Inject 200 mLs (1,000 mg total) into the vein every Monday, Wednesday, and Friday with hemodialysis.    No facility-administered encounter medications on file as of 07/23/2017.     Functional Status: In your present state of health, do you have any difficulty performing the following activities: 07/15/2017 06/23/2017  Hearing? N N  Vision? N N  Difficulty concentrating or making decisions? N N  Walking or climbing stairs? Y Y  Dressing or bathing? Y Y  Doing errands, shopping? Tempie Donning  Preparing Food and eating ? N -  Using the Toilet? N -  In the past six months, have you accidently leaked urine? N -  Do you have problems with loss of bowel control? N -  Managing your Medications? Y -  Managing your Finances? N -  Housekeeping or managing your Housekeeping? Y -  Some recent data might be hidden    Fall/Depression Screening: Fall Risk  07/22/2017 07/15/2017 06/15/2017  Falls in the past year? No No Yes  Number falls in past yr: - - 1  Injury with Fall? - - Yes  Risk Factor Category  - - High Fall Risk  Risk for fall due to : - - History of fall(s)  Follow up - - Falls prevention discussed   PHQ 2/9 Scores 07/22/2017 07/15/2017 06/15/2017 03/04/2015  PHQ - 2 Score 0 1 0 0      Assessment: ASSESSMENT: Date Discharged from Hospital: 07/10/17 Date Medication Reconciliation Performed: 07/24/2017  New Medications at Discharge: (acetaminophen (TYLENOL) cefTAZidime 2 g in sodium chloride 0.9 % 100 mL gabapentin (NEURONTIN) tramadol (ULTRAM) vancomycin (VANCOCIN)  Medications changed at discharge: acetaminophen (TYLENOL) ceftazidime 2 g in sodium chloride 0.9 % 100 mL gabapentin (NEURONTIN) tramadol (ULTRAM) vancomycin (VANCOCIN)  Medications Discontinued at discharge: aspirin 81 MG EC tablet feeding supplement (PRO-STAT SUGAR FREE 64)  fentanyl 25 MCG/HR patch   Patient was recently  discharged from hospital and all medications have been reviewed  Patient was seen by infectious disease (Dr. Johnnye Sima), post discharged on 07/22/17  Drugs sorted by system:  Neurologic/Psychologic: Gabapentin Lorazepam Sertraline  Cardiovascular: Amlodipine Atorvastatin Clonidine Hydralazine Isosorbide Dinitrate Labetalol  Gastrointestinal: Docusate Sodium Metoclopramide Ondansetron Pantoprazole  Endocrine: Lantus  Renal: Sensipar 30mg  Darbepoetin Alfa (Aranesp) Renvella   Pain: Oxycodone Tramadol   Vitamins/Minerals: B-complex-vitamin c- folic acid (nephro vite) Ferric Citrate Multivitamin  Infectious Diseases: Vancomycin Ceftazidime   Medication Findings:  -[facility] Community Nurse, Thea Silversmith completed a home visit and documented the patient was taking Isosorbide DN 30 mg 1 tablet twice daily vs 2 tablets twice daily. Patient could not definitively confirm as she was in the bed.  Surescripts fill history shows Isosorbide DN 30mg  #120 as a 30 day suuply.    PLAN: -call patient back to review  the isosorbide DN dose -route not to PCP once dose has been clarified.  Elayne Guerin, PharmD, [place] Clinical Pharmacist [phone]

## 2017-07-25 ENCOUNTER — Other Ambulatory Visit: Payer: Self-pay | Admitting: Pharmacist

## 2017-07-25 DIAGNOSIS — D649 Anemia, unspecified: Secondary | ICD-10-CM | POA: Diagnosis not present

## 2017-07-25 DIAGNOSIS — A499 Bacterial infection, unspecified: Secondary | ICD-10-CM | POA: Diagnosis not present

## 2017-07-25 DIAGNOSIS — N186 End stage renal disease: Secondary | ICD-10-CM | POA: Diagnosis not present

## 2017-07-25 NOTE — Patient Outreach (Addendum)
[facility]) Care Management  07/25/2017  [name] 1976-03-06 [id number]   Patient was called back to verify the isosorbide DN dose. HIPAA identifiers were obtained. Patient was just getting out of dialysis but was able to read her bottle to me which she said read:  Isosorbide DN 30mg  take 1-2 tablets twice daily.  Patient said she was confused as to what dose she should be taking.  [place] was called to verify the patient's dose.  I spoke with a nurse Harvel Ricks at dialysis center and reconciled the medication list in the patient's medical record with their medication list at the center.)  The nurse did not have the most up to date dose on the Isosorbide Dinitrate and recommended that the patient take the dose from the discharge summary. She updated the dialysis medication list to match.  Patient was called back and informed.  Patient did not report any other medication issues. Medications Reviewed Today    Reviewed by Elayne Guerin, [facility] (Pharmacist) on 07/25/17 at 1355  Med List Status: <None>  Medication Order Taking? Sig Documenting Provider Last Dose Status Informant  acetaminophen (TYLENOL) 325 MG tablet [id number] Yes Take 1-2 tablets (325-650 mg total) by mouth every 4 (four) hours as needed for mild pain. Bary Leriche, PA-C Taking Active   amLODipine (NORVASC) 10 MG tablet [id number] Yes Take 1 tablet (10 mg total) by mouth daily after supper. Bary Leriche, PA-C Taking Active   atorvastatin (LIPITOR) 40 MG tablet [id number] Yes Take 1 tablet (40 mg total) by mouth daily at 6 PM. Love, Ivan Anchors, PA-C Taking Active   b complex-vitamin c-folic acid (NEPHRO-VITE) 0.8 MG TABS tablet [id number] Yes Take 1 tablet by mouth daily. [provider] Taking Active Other  cefTAZidime 2 g in sodium chloride 0.9 % 100 mL [id number] Yes Inject 2 g into the vein every Monday, Wednesday, and Friday with hemodialysis. Bary Leriche, PA-C Taking Active    cinacalcet (SENSIPAR) 30 MG tablet [id number] Yes Take 2 tablets (60 mg total) by mouth daily with supper. Bary Leriche, PA-C Taking Active   cloNIDine (CATAPRES) 0.3 MG tablet [id number] Yes Take 1 tablet (0.3 mg total) by mouth 3 (three) times daily. Bary Leriche, [place] Taking Active         Discontinued 07/25/17 1354 (Change in therapy)   docusate (COLACE) 50 MG/5ML liquid [id number] Yes Take 100 mg by mouth 3 (three) times daily. [provider] Taking Active Other  ferric citrate (AURYXIA) 1 GM 210 MG(Fe) tablet [id number] Yes Take 2 tablets (420 mg total) by mouth 3 (three) times daily with meals. Bary Leriche, PA-C Taking Active   gabapentin (NEURONTIN) 100 MG capsule [id number] Yes Take 1 capsule (100 mg total) by mouth 2 (two) times daily. Bary Leriche, PA-C Taking Active   hydrALAZINE (APRESOLINE) 100 MG tablet [id number] Yes Take 1 tablet (100 mg total) by mouth every 8 (eight) hours. Bary Leriche, PA-C Taking Active   Insulin Glargine (LANTUS SOLOSTAR) 100 UNIT/ML Solostar Pen [id number] Yes Inject 10 Units into the skin daily at 10 pm. Bary Leriche, PA-C Taking Active            Med Note Elayne Guerin   Thu Jul 24, 2017  9:00 AM) Reported 20 units in the morning and 10 units at night.  isosorbide dinitrate (ISORDIL) 30 MG tablet [id number] Yes Take 2 tablets (60 mg total) by mouth 2 (two) times daily. Give  two 30 mg tablets to = 60 mg BID Bary Leriche, PA-C Taking Active            Med Note Luciana Axe, Angelia Mould   Fri Jul 25, 2017  1:55 PM)    labetalol (NORMODYNE) 300 MG tablet [id number] Yes Take 1 tablet (300 mg total) by mouth 3 (three) times daily. Bary Leriche, PA-C Taking Active   LORazepam (ATIVAN) 0.5 MG tablet [id number] Yes Take 1 tablet (0.5 mg total) by mouth every 12 (twelve) hours as needed for anxiety. Bary Leriche, PA-C Taking Active   Methoxy PEG-Epoetin Beta (MIRCERA IJ) [id number] Yes Inject 225 mcg as directed every 21 ( twenty-one) days. (While at  dialysis) Jamal Maes, MD Taking Active   metoCLOPramide (REGLAN) 5 MG tablet [id number] Yes Take 0.5 tablets (2.5 mg total) by mouth 3 (three) times daily before meals. Bary Leriche, PA-C Taking Active   Nutritional Supplements (FEEDING SUPPLEMENT, NEPRO CARB STEADY,) LIQD [id number] Yes Take 237 mLs by mouth 2 (two) times daily between meals. Patrecia Pour, MD Taking Active Other  ondansetron [facility]) 4 MG tablet [id number] Yes Take 4 mg by mouth every 6 (six) hours as needed for nausea or vomiting. [provider] Taking Active Other  oxyCODONE 10 MG TABS [id number] Yes Take 0.5-1 tablets (5-10 mg total) by mouth every 8 (eight) hours as needed for severe pain. Bary Leriche, PA-C Taking Active   pantoprazole (PROTONIX) 40 MG tablet [id number] Yes Take 1 tablet (40 mg total) by mouth daily. Bary Leriche, PA-C Taking Active   sertraline (ZOLOFT) 50 MG tablet [id number] Yes Take 1 tablet (50 mg total) by mouth daily. Bary Leriche, PA-C Taking Active   sevelamer carbonate (RENVELA) 800 MG tablet [id number] Yes Take 3 tablets (2,400 mg total) by mouth 3 (three) times daily with meals. Give 3 tablets to = 2400 mg TID Love, Pamela S, PA-C Taking Active   traMADol (ULTRAM) 50 MG tablet [id number] Yes Take 1 tablet (50 mg total) by mouth every 6 (six) hours as needed for moderate pain. Bary Leriche, PA-C Taking Active   vancomycin (VANCOCIN) 1-5 GM/200ML-% SOLN [id number] Yes Inject 200 mLs (1,000 mg total) into the vein every Monday, Wednesday, and Friday with hemodialysis. Bary Leriche, PA-C Taking Active   Med List Note Elyn Peers, CPhT 10/30/14 2118): Dialysis patient on Monday, Wednesday, Fridays. See chart for further information           Plan: Close pharmacy case. Patient understands she can call [facility] Pharmacist at anytime in the future with medication related phone calls or concerns. Notify [facility] Community Case Manager of Pharmacy Case [place], PharmD,  [place] Clinical Pharmacist [phone]

## 2017-07-28 DIAGNOSIS — N186 End stage renal disease: Secondary | ICD-10-CM | POA: Diagnosis not present

## 2017-07-28 DIAGNOSIS — D649 Anemia, unspecified: Secondary | ICD-10-CM | POA: Diagnosis not present

## 2017-07-28 DIAGNOSIS — A499 Bacterial infection, unspecified: Secondary | ICD-10-CM | POA: Diagnosis not present

## 2017-07-29 ENCOUNTER — Other Ambulatory Visit: Payer: Self-pay

## 2017-07-29 DIAGNOSIS — M8618 Other acute osteomyelitis, other site: Secondary | ICD-10-CM | POA: Diagnosis not present

## 2017-07-29 DIAGNOSIS — L89154 Pressure ulcer of sacral region, stage 4: Secondary | ICD-10-CM | POA: Diagnosis not present

## 2017-07-29 DIAGNOSIS — I132 Hypertensive heart and chronic kidney disease with heart failure and with stage 5 chronic kidney disease, or end stage renal disease: Secondary | ICD-10-CM | POA: Diagnosis not present

## 2017-07-29 DIAGNOSIS — I5032 Chronic diastolic (congestive) heart failure: Secondary | ICD-10-CM | POA: Diagnosis not present

## 2017-07-29 DIAGNOSIS — Z4781 Encounter for orthopedic aftercare following surgical amputation: Secondary | ICD-10-CM | POA: Diagnosis not present

## 2017-07-29 DIAGNOSIS — E1122 Type 2 diabetes mellitus with diabetic chronic kidney disease: Secondary | ICD-10-CM | POA: Diagnosis not present

## 2017-07-29 NOTE — Patient Outreach (Signed)
[name]) Care Management  07/29/2017  [name] 17-Apr-1976 [id number]   Subjective: client denies any questions or concerns.  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 Admission 1/16-1/28 -disposition rehabilitation. Admission 1/28 to inpatient rehabilitation and discharged to home on 07/10/17.  Follow up transition of care call. Client reports she has been to the [facility] and has a follow up visit scheduled this Thursday. She reports the possibility of a wound vac. She states home health is still involved in her care and that her dressings are changed by both her husband and the nurse. Per notes from infectious disease her sacral wound is healing well. Client continues with dialysis and will see surgeon on Monday regarding recent right BKA.  She denies any questions or concerns.  Plan: follow up telephonic next week.  Thea Silversmith, RN, MSN, [place] Coordinator Cell: [phone]

## 2017-07-30 DIAGNOSIS — D649 Anemia, unspecified: Secondary | ICD-10-CM | POA: Diagnosis not present

## 2017-07-30 DIAGNOSIS — N186 End stage renal disease: Secondary | ICD-10-CM | POA: Diagnosis not present

## 2017-07-30 DIAGNOSIS — A499 Bacterial infection, unspecified: Secondary | ICD-10-CM | POA: Diagnosis not present

## 2017-07-30 LAB — FUNGUS CULTURE WITH STAIN

## 2017-07-30 LAB — FUNGAL ORGANISM REFLEX

## 2017-07-30 LAB — FUNGUS CULTURE RESULT

## 2017-07-31 ENCOUNTER — Encounter: Payer: Self-pay | Admitting: Physical Medicine & Rehabilitation

## 2017-07-31 ENCOUNTER — Encounter (HOSPITAL_BASED_OUTPATIENT_CLINIC_OR_DEPARTMENT_OTHER): Payer: Medicare Other | Attending: Internal Medicine

## 2017-07-31 ENCOUNTER — Encounter: Payer: Medicare Other | Attending: Physical Medicine & Rehabilitation | Admitting: Physical Medicine & Rehabilitation

## 2017-07-31 VITALS — BP 182/50 | HR 77

## 2017-07-31 DIAGNOSIS — E781 Pure hyperglyceridemia: Secondary | ICD-10-CM | POA: Insufficient documentation

## 2017-07-31 DIAGNOSIS — I169 Hypertensive crisis, unspecified: Secondary | ICD-10-CM

## 2017-07-31 DIAGNOSIS — G546 Phantom limb syndrome with pain: Secondary | ICD-10-CM

## 2017-07-31 DIAGNOSIS — L89329 Pressure ulcer of left buttock, unspecified stage: Secondary | ICD-10-CM | POA: Diagnosis not present

## 2017-07-31 DIAGNOSIS — R188 Other ascites: Secondary | ICD-10-CM | POA: Diagnosis not present

## 2017-07-31 DIAGNOSIS — I132 Hypertensive heart and chronic kidney disease with heart failure and with stage 5 chronic kidney disease, or end stage renal disease: Secondary | ICD-10-CM | POA: Diagnosis not present

## 2017-07-31 DIAGNOSIS — Z89511 Acquired absence of right leg below knee: Secondary | ICD-10-CM | POA: Insufficient documentation

## 2017-07-31 DIAGNOSIS — E1169 Type 2 diabetes mellitus with other specified complication: Secondary | ICD-10-CM | POA: Diagnosis not present

## 2017-07-31 DIAGNOSIS — L89154 Pressure ulcer of sacral region, stage 4: Secondary | ICD-10-CM | POA: Insufficient documentation

## 2017-07-31 DIAGNOSIS — Z992 Dependence on renal dialysis: Secondary | ICD-10-CM | POA: Insufficient documentation

## 2017-07-31 DIAGNOSIS — I10 Essential (primary) hypertension: Secondary | ICD-10-CM

## 2017-07-31 DIAGNOSIS — N186 End stage renal disease: Secondary | ICD-10-CM | POA: Insufficient documentation

## 2017-07-31 DIAGNOSIS — F419 Anxiety disorder, unspecified: Secondary | ICD-10-CM | POA: Insufficient documentation

## 2017-07-31 DIAGNOSIS — E1022 Type 1 diabetes mellitus with diabetic chronic kidney disease: Secondary | ICD-10-CM | POA: Insufficient documentation

## 2017-07-31 DIAGNOSIS — L609 Nail disorder, unspecified: Secondary | ICD-10-CM | POA: Diagnosis not present

## 2017-07-31 DIAGNOSIS — G9389 Other specified disorders of brain: Secondary | ICD-10-CM

## 2017-07-31 DIAGNOSIS — G8918 Other acute postprocedural pain: Secondary | ICD-10-CM | POA: Diagnosis not present

## 2017-07-31 DIAGNOSIS — I509 Heart failure, unspecified: Secondary | ICD-10-CM | POA: Diagnosis not present

## 2017-07-31 DIAGNOSIS — Z87891 Personal history of nicotine dependence: Secondary | ICD-10-CM | POA: Insufficient documentation

## 2017-07-31 DIAGNOSIS — E1040 Type 1 diabetes mellitus with diabetic neuropathy, unspecified: Secondary | ICD-10-CM | POA: Insufficient documentation

## 2017-07-31 DIAGNOSIS — M869 Osteomyelitis, unspecified: Secondary | ICD-10-CM

## 2017-07-31 DIAGNOSIS — R531 Weakness: Secondary | ICD-10-CM | POA: Diagnosis not present

## 2017-07-31 DIAGNOSIS — E1143 Type 2 diabetes mellitus with diabetic autonomic (poly)neuropathy: Secondary | ICD-10-CM | POA: Insufficient documentation

## 2017-07-31 DIAGNOSIS — E10621 Type 1 diabetes mellitus with foot ulcer: Secondary | ICD-10-CM | POA: Diagnosis not present

## 2017-07-31 DIAGNOSIS — L928 Other granulomatous disorders of the skin and subcutaneous tissue: Secondary | ICD-10-CM | POA: Diagnosis not present

## 2017-07-31 DIAGNOSIS — I2510 Atherosclerotic heart disease of native coronary artery without angina pectoris: Secondary | ICD-10-CM | POA: Insufficient documentation

## 2017-07-31 DIAGNOSIS — L97521 Non-pressure chronic ulcer of other part of left foot limited to breakdown of skin: Secondary | ICD-10-CM | POA: Diagnosis not present

## 2017-07-31 DIAGNOSIS — E1122 Type 2 diabetes mellitus with diabetic chronic kidney disease: Secondary | ICD-10-CM | POA: Diagnosis not present

## 2017-07-31 DIAGNOSIS — G939 Disorder of brain, unspecified: Secondary | ICD-10-CM | POA: Diagnosis not present

## 2017-07-31 DIAGNOSIS — M4628 Osteomyelitis of vertebra, sacral and sacrococcygeal region: Secondary | ICD-10-CM | POA: Diagnosis not present

## 2017-07-31 DIAGNOSIS — E1051 Type 1 diabetes mellitus with diabetic peripheral angiopathy without gangrene: Secondary | ICD-10-CM | POA: Insufficient documentation

## 2017-07-31 NOTE — Progress Notes (Signed)
Subjective:    Patient ID: [name], female    DOB: April 14, 1976, 42 y.o.   MRN: [id number]  HPI 42 y.o. female with histoyr of CAD, ESRD, T2DM, right foot wounds s/p revascularization complicated hospital course 03/2017, sacral decub and RLE ischemia requiring right metatarsal amputation with exploration of right groin wound 04/2017 presents for transitional care management after receiving CIR for right BKA as well as multiple other medical issues.   Admit date: 06/23/2017 Discharge date: 07/10/2017  At discharge, she was instructed to clean her groin wound and she states that has healed.  She went to wound care and is planning to have a bone biopsy next week.  She is currently on abx.  She states she has been keeping her knee in extension. She states she is performing pressure relief. She stopped taking pain meds.  She has not scheduled to see Neurosurg. She sees Vasc Surg on Monday. She sees PCP next week. She saw ID last week. She states CBGs have been in the 150s. BP remains elevated. She sees Nephro 1/week and they are managing decision to perform paracentesis.  Therapies:  2/week DME: Previously possessed Mobility: Scooter in house, wheelchair in community.  Pain Inventory Average Pain 5 Pain Right Now 1 My pain is burning, dull and aching  In the last 24 hours, has pain interfered with the following? General activity 4 Relation with others 10 Enjoyment of life 5 What TIME of day is your pain at its worst? night Sleep (in general) Fair  Pain is worse with: some activites Pain improves with: rest Relief from Meds: 4  Mobility ability to climb steps?  no do you drive?  no use a wheelchair needs help with transfers transfers alone  Function disabled: date disabled . I need assistance with the following:  meal prep and shopping  Neuro/Psych weakness trouble walking anxiety  Prior Studies Any changes since last visit?  no  Physicians involved in your  care Any changes since last visit?  no   Family History  Problem Relation Age of Onset  . Diabetes Mother   . Hypertension Mother   . Diabetes Father   . Hyperlipidemia Father   . Hypertension Father   . Diabetes Sister   . Stroke Maternal Grandmother   . Diabetes Sister   . Breast cancer Maternal Aunt        Age 50's   Social History   Socioeconomic History  . Marital status: Married    Spouse name: None  . Number of children: None  . Years of education: None  . Highest education level: None  Social Needs  . Financial resource strain: None  . Food insecurity - worry: None  . Food insecurity - inability: None  . Transportation needs - medical: None  . Transportation needs - non-medical: None  Occupational History    Employer: [employer]  Tobacco Use  . Smoking status: Former Smoker    Packs/day: 0.30    Years: 10.00    Pack years: 3.00    Types: Cigarettes    Last attempt to quit: 08/05/2011    Years since quitting: 5.9  . Smokeless tobacco: Never Used  Substance and Sexual Activity  . Alcohol use: No  . Drug use: No  . Sexual activity: None  Other Topics Concern  . None  Social History Narrative  . None   Past Surgical History:  Procedure Laterality Date  . ABDOMINAL AORTOGRAM W/LOWER EXTREMITY [name] 03/27/2017   Procedure: ABDOMINAL  AORTOGRAM W/LOWER EXTREMITY;  Surgeon: Conrad [place], MD;  Location: [place] [facility];  Service: Cardiovascular;  Laterality: [name];  . AMPUTATION Right 05/09/2017   Procedure: AMPUTATION TRANSMETATARSAL OF RIGHT FOOT;  Surgeon: Serafina Mitchell, MD;  Location: [place];  Service: Vascular;  Laterality: Right;  . AMPUTATION Right 06/17/2017   Procedure: AMPUTATION BELOW KNEE RIGHT;  Surgeon: Serafina Mitchell, MD;  [facility];  Service: Vascular;  Laterality: Right;  . AV FISTULA PLACEMENT Left 04/2010   "lower arm"  . CARDIAC CATHETERIZATION [name] 02/10/2015   Procedure: Left Heart Cath and Coronary Angiography;  Surgeon: Dixie Dials, MD;  Location: [place] [facility];  Service: Cardiovascular;  Laterality: [name];  . CARDIAC CATHETERIZATION [name] 02/21/2016   Procedure: Right Heart Cath;  Surgeon: Jolaine Artist, MD;  Location: [place] [facility];  Service: Cardiovascular;  Laterality: [name];  . CATARACT EXTRACTION W/ INTRAOCULAR LENS  IMPLANT, BILATERAL Bilateral   . CERVICAL BIOPSY  W/ LOOP ELECTRODE EXCISION     h/o  . CERVICAL CONE BIOPSY     h/o  . COLPOSCOPY    . DILATION AND CURETTAGE OF UTERUS  2009  . ENDARTERECTOMY FEMORAL Right 04/16/2017   Procedure: RIGHT FEMORAL ENDARTERECTOMY;  Surgeon: Serafina Mitchell, MD;  Location: [place];  Service: Vascular;  Laterality: Right;  . FRACTURE SURGERY    . HERNIA REPAIR    . INCISION AND DRAINAGE ABSCESS     "groin"  . INSERTION OF DIALYSIS CATHETER Left 04/16/2017   Procedure: INSERTION of temporay DIALYSIS CATHETER, left femoral artery;  Surgeon: Serafina Mitchell, MD;  Location: [place];  Service: Vascular;  Laterality: Left;  . IR HYBRID TRAUMA EMBOLIZATION  04/16/2017  . IR PARACENTESIS  06/12/2017  . IR PARACENTESIS  06/24/2017  . IR PARACENTESIS  07/01/2017  . IR PARACENTESIS  07/10/2017  . ORIF ANKLE FRACTURE Right 02/16/2015   Procedure: OPEN REDUCTION INTERNAL FIXATION (ORIF) RIGHT ANKLE FRACTURE;  Surgeon: Altamese Fairfield Harbour, MD;  Location: [place];  Service: Orthopedics;  Laterality: Right;  . PATCH ANGIOPLASTY Right 04/16/2017   Procedure: PATCH ANGIOPLASTY OF RIGHT FEMORAL ARTERY USING BOVINE PERICARDIUM PATCH;  Surgeon: Serafina Mitchell, MD;  [facility];  Service: Vascular;  Laterality: Right;  . UMBILICAL HERNIA REPAIR  X 2  . WOUND EXPLORATION Right 05/09/2017   Procedure: EXPLORATION RIGHT GROIN;  Surgeon: Serafina Mitchell, MD;  [facility];  Service: Vascular;  Laterality: Right;   Past Medical History:  Diagnosis Date  . Arthritis   . CHF (congestive heart failure) ([place])   . Chronic anemia    2nd to renal disease  . CIN III (cervical  intraepithelial neoplasia grade III) with severe dysplasia    S/P LEEP AND CONE  . Coronary artery disease    Status post cardiac catheterization June 2012 scattered coronary artery disease/atherosclerosis with 70-80% stenosis in a small right PDA.  . Diabetic Charcot's joint disease ([place])   . DM (diabetes mellitus) ([place])    Long-term insulin  . End stage renal disease on dialysis [facility]) 05/02/11   "[facility]"; [facility]; M; W, F ("got it 04/15/2017 because of Thanksgiving")  . Fracture of 5th metatarsal 2016   Right  . Gastroparesis   . Heart murmur   . 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 History of abscesses in groins 12/06/2010  . Hyperlipidemia    Hypertriglyceridemia 449 HDL 25  . Hypertension   . Migraines    "just on dialysis  days"  . Peripheral neuropathy    related to DM  . Peripheral vascular disease ([place])    Tibial occlusive disease evaluated by Dr. Kellie Simmering in August 2011. Medical therapy  . Renal insufficiency    Dialysis since 2012  . Seizures ([place])    "only when her sugar drops" (04/17/2017)  . Tobacco use disorder    Discontinued March 2012  . Trimalleolar fracture of ankle, closed 02/09/2015   Right   BP (!) 182/50   Pulse 77   LMP 10/09/2015 Comment: pt on dialysis  SpO2 95%   Opioid Risk Score:   Fall Risk Score:  `1  Depression screen PHQ 2/9  Depression screen [facility] 2/9 07/31/2017 07/22/2017 07/15/2017 06/15/2017 03/04/2015  Decreased Interest 2 0 0 0 0  Down, Depressed, Hopeless 2 0 1 0 0  PHQ - 2 Score 4 0 1 0 0  Altered sleeping 2 - - - -  Tired, decreased energy 1 - - - -  Change in appetite 0 - - - -  Feeling bad or failure about yourself  2 - - - -  Trouble concentrating 1 - - - -  Moving slowly or fidgety/restless 0 - - - -  Suicidal thoughts 0 - - - -  PHQ-9 Score 10 - - - -  Difficult doing work/chores Not difficult at all - - - -  Some recent data might be hidden        Review of Systems  HENT: Negative.   Eyes: Negative.   Respiratory: Negative.    Cardiovascular: Negative.   Gastrointestinal: Negative.   Endocrine: Negative.   Genitourinary: Negative.   Musculoskeletal: Positive for gait problem.  Skin: Negative.   Allergic/Immunologic: Negative.   Neurological: Positive for weakness.  Hematological: Bruises/bleeds easily.  Psychiatric/Behavioral: The patient is nervous/anxious.   All other systems reviewed and are negative.     Objective:   Physical Exam Constitutional: She has a sickly appearance. No distress.  HENT: Normocephalic and atraumatic.  Eyes:  and EOM are normal. No discharge.  Cardiovascular: RRR. No JVD. Respiratory: Effort normal  and breath sounds normal.  GI: Bowel sounds are normal. +Distended. Ascites, firm.  Musculoskeletal: She exhibits mild edema.  Neurological: She is alert and oriented.  Motor: 4-4+/5 B/l UE LLE HF 3+/5, KE 4-/5, ADF 4/5 RLE HF 4/5 Skin: Skin is warm and dry. She is not diaphoretic.  Ischial Ulcer, not examined  Right BKA healing Psychiatric: Her speech is normal. Blunt.    Assessment & Plan:  42 y.o. female with histoyr of CAD, ESRD, T2DM, right foot wounds s/p revascularization complicated hospital course 03/2017, sacral decub and RLE ischemia requiring right metatarsal amputation with exploration of right groin wound 04/2017 presents for transitional care management after receiving CIR for right BKA as well as multiple other medical issues.   1. Deficits with mobility, transfers, self-care secondary to Right BKA and wounds.   Cont therapies  Follow up with Vascular  2. Pain Management:   Relatively controlled at present  3. Left Ischium decub with osteo  Cont dressing changes  Cont follow up with wound care  4. T2DM: Monitor BS ac/hs.   Cont meds  Relatively controlled at present  5. HTN:   Remains uncontrolled with hypertensive crisis today  Cont follow up with Nephrology  6. Ascites  Monitor weights  Decision to tap per Nephro  7. ESRD   Recs per  [place]  8. ?Brain mass  Needs follow up with Neurosurg  Meds reviewed Referrals reviewed All questions answered

## 2017-08-01 DIAGNOSIS — D649 Anemia, unspecified: Secondary | ICD-10-CM | POA: Diagnosis not present

## 2017-08-01 DIAGNOSIS — N186 End stage renal disease: Secondary | ICD-10-CM | POA: Diagnosis not present

## 2017-08-01 DIAGNOSIS — A499 Bacterial infection, unspecified: Secondary | ICD-10-CM | POA: Diagnosis not present

## 2017-08-04 ENCOUNTER — Ambulatory Visit (INDEPENDENT_AMBULATORY_CARE_PROVIDER_SITE_OTHER): Payer: Medicare Other | Admitting: Surgery

## 2017-08-04 ENCOUNTER — Encounter: Payer: Self-pay | Admitting: Surgery

## 2017-08-04 ENCOUNTER — Other Ambulatory Visit: Payer: Self-pay

## 2017-08-04 VITALS — BP 192/90 | HR 80 | Temp 97.6°F | Resp 16 | Ht 76.0 in | Wt 191.8 lb

## 2017-08-04 DIAGNOSIS — A499 Bacterial infection, unspecified: Secondary | ICD-10-CM | POA: Diagnosis not present

## 2017-08-04 DIAGNOSIS — D649 Anemia, unspecified: Secondary | ICD-10-CM | POA: Diagnosis not present

## 2017-08-04 DIAGNOSIS — N186 End stage renal disease: Secondary | ICD-10-CM | POA: Diagnosis not present

## 2017-08-04 DIAGNOSIS — Z89511 Acquired absence of right leg below knee: Secondary | ICD-10-CM

## 2017-08-04 NOTE — Progress Notes (Signed)
POST OPERATIVE OFFICE NOTE    CC:  F/u for surgery  HPI:  This is a 42 y.o. female who is s/p underwent a right CFA, profunda, SFA and external iliac endarterectomy with bovine patch angioplasty on 04/16/17 and during that time, she became unstable and received CPR & was transferred to the ICU.  She subsequently was neuro in tact and pressors weaned.  She eventually had a tracheostomy. A JP drain was placed during surgery and was left for quite some time due to increased drainage.  The was eventually removed.  She did have healing issues in her right groin and needed extensive wound care.  Once she became more stable, she underwent a right TMA and exploration of her groin with evacuation of seroma and wound vac placement on 05/09/17.    She had gone to [facility] and was discharged from there on January 15th.  She has been weaned from the trach and is now on room air.    She subsequently underwent a right BKA on 06/17/17 by Dr. Trula Slade.  At discharge, she went to [facility] and has been home for about a month now.  She states that she is doing well.  Her right groin has healed.  She is followed by ID and her last dose of abx should've been today on HD.   She states that she has been working with [facility] and is working on straightening her knee.   She dialyzes M/W/F.      Allergies  Allergen Reactions  . Cephalexin Other (See Comments)    Reaction unknown- Childhood allergy Tolerated Ceftriaxone in the past  . Sulfamethoxazole-Trimethoprim Other (See Comments)    Unknown reaction. Pt states that she was told by her mother that she had allergy to Bactrim as a child.    Current Outpatient Medications  Medication Sig Dispense Refill  . acetaminophen (TYLENOL) 325 MG tablet Take 1-2 tablets (325-650 mg total) by mouth every 4 (four) hours as needed for mild pain.    [place] Kitchen amLODipine (NORVASC) 10 MG tablet Take 1 tablet (10 mg total) by mouth daily after supper. 30 tablet 0  . atorvastatin (LIPITOR) 40 MG  tablet Take 1 tablet (40 mg total) by mouth daily at 6 PM. 30 tablet 0  . b complex-vitamin c-folic acid (NEPHRO-VITE) 0.8 MG TABS tablet Take 1 tablet by mouth daily.    . cefTAZidime 2 g in sodium chloride 0.9 % 100 mL Inject 2 g into the vein every Monday, Wednesday, and Friday with hemodialysis.    Mar[place] Kitchen cinacalcet (SENSIPAR) 30 MG tablet Take 2 tablets (60 mg total) by mouth daily with supper. 60 tablet 0  . cloNIDine (CATAPRES) 0.3 MG tablet Take 1 tablet (0.3 mg total) by mouth 3 (three) times daily. 90 tablet 0  . docusate (COLACE) 50 MG/5ML liquid Take 100 mg by mouth 3 (three) times daily.    . ferric citrate (AURYXIA) 1 GM 210 MG(Fe) tablet Take 2 tablets (420 mg total) by mouth 3 (three) times daily with meals. 270 tablet 0  . gabapentin (NEURONTIN) 100 MG capsule Take 1 capsule (100 mg total) by mouth 2 (two) times daily. 60 capsule 0  . hydrALAZINE (APRESOLINE) 100 MG tablet Take 1 tablet (100 mg total) by mouth every 8 (eight) hours. 90 tablet 1  . Insulin Glargine (LANTUS SOLOSTAR) 100 UNIT/ML Solostar Pen Inject 10 Units into the skin daily at 10 pm. 15 mL 11  . isosorbide dinitrate (ISORDIL) 30 MG tablet Take 2 tablets (60  mg total) by mouth 2 (two) times daily. Give two 30 mg tablets to = 60 mg BID 120 tablet 0  . labetalol (NORMODYNE) 300 MG tablet Take 1 tablet (300 mg total) by mouth 3 (three) times daily. 90 tablet 0  . LORazepam (ATIVAN) 0.5 MG tablet Take 1 tablet (0.5 mg total) by mouth every 12 (twelve) hours as needed for anxiety. 30 tablet 0  . Methoxy PEG-Epoetin Beta (MIRCERA IJ) Inject 225 mcg as directed every 21 ( twenty-one) days. (While at dialysis)    . metoCLOPramide (REGLAN) 5 MG tablet Take 0.5 tablets (2.5 mg total) by mouth 3 (three) times daily before meals. 45 tablet 0  . ondansetron (ZOFRAN) 4 MG tablet Take 4 mg by mouth every 6 (six) hours as needed for nausea or vomiting.    . pantoprazole (PROTONIX) 40 MG tablet Take 1 tablet (40 mg total) by mouth daily.  30 tablet 0  . sertraline (ZOLOFT) 50 MG tablet Take 1 tablet (50 mg total) by mouth daily. 30 tablet 0  . sevelamer carbonate (RENVELA) 800 MG tablet Take 3 tablets (2,400 mg total) by mouth 3 (three) times daily with meals. Give 3 tablets to = 2400 mg TID 270 tablet 0  . traMADol (ULTRAM) 50 MG tablet Take 1 tablet (50 mg total) by mouth every 6 (six) hours as needed for moderate pain. 30 tablet 0  . vancomycin (VANCOCIN) 1-5 GM/200ML-% SOLN Inject 200 mLs (1,000 mg total) into the vein every Monday, Wednesday, and Friday with hemodialysis. 4000 mL   . oxyCODONE 10 MG TABS Take 0.5-1 tablets (5-10 mg total) by mouth every 8 (eight) hours as needed for severe pain. (Patient not taking: Reported on 08/04/2017) 30 tablet 0   No current facility-administered medications for this visit.      ROS:  See HPI  Physical Exam:  Vitals:   08/04/17 1528  BP: (!) 205/101  Pulse: 80  Resp: 16  Temp: 97.6 F (36.4 C)  SpO2: 95%    Incision:      Extremities:  Not quite able to completely straighten knee   Assessment/Plan:  This is a 42 y.o. female who is s/p: Right BKA 06/17/17  -pt doing well with BKA-continue with Vaseline gauze dressing, 4x4's and kerlix.   -Dr. Trula Slade discussed with pt and husband that she is not out of the woods, but it certainly is improved from when she was discharged from [place] -continue working on straightening the knee -BP retaken at the end of the visit and was down to 192/90.  She states she usually runs around 180 so most likely elevated b/c of anticipation of doctor visit -she will f/u again with Dr. Trula Slade in 4 weeks.     [name], PA-C [facility] [phone]  Clinic MD:  Pt seen and examined with Dr. Trula Slade

## 2017-08-05 DIAGNOSIS — E1021 Type 1 diabetes mellitus with diabetic nephropathy: Secondary | ICD-10-CM | POA: Diagnosis not present

## 2017-08-05 DIAGNOSIS — G546 Phantom limb syndrome with pain: Secondary | ICD-10-CM | POA: Diagnosis not present

## 2017-08-05 DIAGNOSIS — E1042 Type 1 diabetes mellitus with diabetic polyneuropathy: Secondary | ICD-10-CM | POA: Diagnosis not present

## 2017-08-05 DIAGNOSIS — D638 Anemia in other chronic diseases classified elsewhere: Secondary | ICD-10-CM | POA: Diagnosis not present

## 2017-08-05 DIAGNOSIS — E1065 Type 1 diabetes mellitus with hyperglycemia: Secondary | ICD-10-CM | POA: Diagnosis not present

## 2017-08-06 ENCOUNTER — Other Ambulatory Visit (HOSPITAL_COMMUNITY): Payer: Self-pay | Admitting: Nephrology

## 2017-08-06 ENCOUNTER — Other Ambulatory Visit: Payer: Self-pay

## 2017-08-06 DIAGNOSIS — R188 Other ascites: Secondary | ICD-10-CM

## 2017-08-06 DIAGNOSIS — A499 Bacterial infection, unspecified: Secondary | ICD-10-CM | POA: Diagnosis not present

## 2017-08-06 DIAGNOSIS — N186 End stage renal disease: Secondary | ICD-10-CM | POA: Diagnosis not present

## 2017-08-06 DIAGNOSIS — D649 Anemia, unspecified: Secondary | ICD-10-CM | POA: Diagnosis not present

## 2017-08-06 NOTE — Patient Outreach (Signed)
[facility]) Care Management  08/06/2017  [name] 12/21/1975 [id number]    Assessment: 25 year oldwith history of brain mass, fall,ESRD/HD, sacral osteomylitis,HTN, DM, heart failure,hypertensive crisis, recurrent abdonimal ascities, cirrhosis.Per chart right BKA on 06/17/17. Admission 1/16-1/28 -disposition rehabilitation. Admission 1/28 to inpatient rehabilitation and discharged to home on 07/10/17.  Transition of care call. No answer.  HIPPA compliant message left.  Plan: Await return call. Follow up call tomorrow, if no return call.Thea Silversmith, RN, MSN, [place] Coordinator Cell: [phone]

## 2017-08-07 ENCOUNTER — Other Ambulatory Visit: Payer: Self-pay | Admitting: Internal Medicine

## 2017-08-07 ENCOUNTER — Other Ambulatory Visit: Payer: Self-pay

## 2017-08-07 DIAGNOSIS — Z4781 Encounter for orthopedic aftercare following surgical amputation: Secondary | ICD-10-CM | POA: Diagnosis not present

## 2017-08-07 DIAGNOSIS — I132 Hypertensive heart and chronic kidney disease with heart failure and with stage 5 chronic kidney disease, or end stage renal disease: Secondary | ICD-10-CM | POA: Diagnosis not present

## 2017-08-07 DIAGNOSIS — M8618 Other acute osteomyelitis, other site: Secondary | ICD-10-CM | POA: Diagnosis not present

## 2017-08-07 DIAGNOSIS — E1122 Type 2 diabetes mellitus with diabetic chronic kidney disease: Secondary | ICD-10-CM | POA: Diagnosis not present

## 2017-08-07 DIAGNOSIS — L89154 Pressure ulcer of sacral region, stage 4: Secondary | ICD-10-CM | POA: Diagnosis not present

## 2017-08-07 DIAGNOSIS — I5032 Chronic diastolic (congestive) heart failure: Secondary | ICD-10-CM | POA: Diagnosis not present

## 2017-08-07 DIAGNOSIS — E10621 Type 1 diabetes mellitus with foot ulcer: Secondary | ICD-10-CM | POA: Diagnosis not present

## 2017-08-07 DIAGNOSIS — D1801 Hemangioma of skin and subcutaneous tissue: Secondary | ICD-10-CM | POA: Diagnosis not present

## 2017-08-07 DIAGNOSIS — I2510 Atherosclerotic heart disease of native coronary artery without angina pectoris: Secondary | ICD-10-CM | POA: Diagnosis not present

## 2017-08-07 DIAGNOSIS — E1051 Type 1 diabetes mellitus with diabetic peripheral angiopathy without gangrene: Secondary | ICD-10-CM | POA: Diagnosis not present

## 2017-08-07 DIAGNOSIS — L97521 Non-pressure chronic ulcer of other part of left foot limited to breakdown of skin: Secondary | ICD-10-CM | POA: Diagnosis not present

## 2017-08-07 DIAGNOSIS — E1040 Type 1 diabetes mellitus with diabetic neuropathy, unspecified: Secondary | ICD-10-CM | POA: Diagnosis not present

## 2017-08-07 NOTE — Patient Outreach (Signed)
[facility]) Care Management  08/07/2017  [name] 08-08-1975 [id number]   Subjective: "I am at the doctor's office now".  Objective: none  Assessment: 21 year oldwith history of brain mass, fall,ESRD/HD, sacral osteomylitis,HTN, DM, heart failure,hypertensive crisis, recurrent abdonimal ascities, cirrhosis.Per chart right BKA on 06/17/17. Admission 1/16-1/28 -disposition rehabilitation. Admission 1/28 to inpatient rehabilitation and discharged to home on 07/10/17  RNCM called to follow up.Client reports that she is doing well and that her wounds are improving. She reports she is at the wound care center for management of her sacral wound. She reports her stump area per vascular surgeon is healing. Dressing by home health.  Plan: follow up next week.  Thea Silversmith, RN, MSN, [place] Coordinator Cell: [phone]

## 2017-08-08 ENCOUNTER — Ambulatory Visit (HOSPITAL_COMMUNITY)
Admission: RE | Admit: 2017-08-08 | Discharge: 2017-08-08 | Disposition: A | Discharge status: Home / Self Care | Payer: Medicare Other | Source: Ambulatory Visit | Attending: Nephrology | Admitting: Nephrology

## 2017-08-08 ENCOUNTER — Encounter (HOSPITAL_COMMUNITY): Payer: Self-pay

## 2017-08-08 DIAGNOSIS — N186 End stage renal disease: Secondary | ICD-10-CM | POA: Diagnosis not present

## 2017-08-08 DIAGNOSIS — R188 Other ascites: Secondary | ICD-10-CM | POA: Insufficient documentation

## 2017-08-08 DIAGNOSIS — D649 Anemia, unspecified: Secondary | ICD-10-CM | POA: Diagnosis not present

## 2017-08-08 DIAGNOSIS — A499 Bacterial infection, unspecified: Secondary | ICD-10-CM | POA: Diagnosis not present

## 2017-08-08 LAB — FUNGUS CULTURE WITH STAIN

## 2017-08-08 LAB — FUNGUS CULTURE RESULT

## 2017-08-08 LAB — FUNGAL ORGANISM REFLEX

## 2017-08-08 MED ORDER — LIDOCAINE HCL (PF) 1 % IJ SOLN
INTRAMUSCULAR | Status: AC
  Filled 2017-08-08: qty 10

## 2017-08-08 NOTE — Procedures (Signed)
PROCEDURE SUMMARY:  Successful US guided paracentesis from right lateral abdomen.  Yielded 10.3 liters of yellow fluid.  No immediate complications.  Pt tolerated well.   Specimen was not sent for labs.  Docia Barrier PA-C 08/08/2017 2:57 PM

## 2017-08-09 DIAGNOSIS — M8618 Other acute osteomyelitis, other site: Secondary | ICD-10-CM | POA: Diagnosis not present

## 2017-08-09 DIAGNOSIS — I5032 Chronic diastolic (congestive) heart failure: Secondary | ICD-10-CM | POA: Diagnosis not present

## 2017-08-09 DIAGNOSIS — I132 Hypertensive heart and chronic kidney disease with heart failure and with stage 5 chronic kidney disease, or end stage renal disease: Secondary | ICD-10-CM | POA: Diagnosis not present

## 2017-08-09 DIAGNOSIS — E1122 Type 2 diabetes mellitus with diabetic chronic kidney disease: Secondary | ICD-10-CM | POA: Diagnosis not present

## 2017-08-09 DIAGNOSIS — Z4781 Encounter for orthopedic aftercare following surgical amputation: Secondary | ICD-10-CM | POA: Diagnosis not present

## 2017-08-09 DIAGNOSIS — L89154 Pressure ulcer of sacral region, stage 4: Secondary | ICD-10-CM | POA: Diagnosis not present

## 2017-08-11 ENCOUNTER — Other Ambulatory Visit: Payer: Self-pay

## 2017-08-11 DIAGNOSIS — D649 Anemia, unspecified: Secondary | ICD-10-CM | POA: Diagnosis not present

## 2017-08-11 DIAGNOSIS — N186 End stage renal disease: Secondary | ICD-10-CM | POA: Diagnosis not present

## 2017-08-11 DIAGNOSIS — A499 Bacterial infection, unspecified: Secondary | ICD-10-CM | POA: Diagnosis not present

## 2017-08-11 NOTE — Patient Outreach (Signed)
[facility] [facility]) Care Management  08/11/2017  [name] 05/16/1976 [id number]  Subjective: Client denies any questions or concerns.   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 Admission 1/16-1/28 -disposition rehabilitation. Admission 1/28 to inpatient rehabilitation and discharged to home on 07/10/17.   RNCM called to follow up. Client reprots she is doing well. She denies pain to right stump at this time. She states, that the area is healing.  She reports wound to sacrum is slowly healing she states that a biopsy was taken of the area last week at the wound care clinic.She reports that when she has discomfort at the sacral area it is relieved with tylenol and position changes. At it's worst it is a "7", but tylehol helps to bring the pain down to a "3". No questions or concerns at this time.  Plan: home visit next month.  Thea Silversmith, RN, MSN, [place] Coordinator Cell: [phone]

## 2017-08-13 DIAGNOSIS — N186 End stage renal disease: Secondary | ICD-10-CM | POA: Diagnosis not present

## 2017-08-13 DIAGNOSIS — A499 Bacterial infection, unspecified: Secondary | ICD-10-CM | POA: Diagnosis not present

## 2017-08-13 DIAGNOSIS — D649 Anemia, unspecified: Secondary | ICD-10-CM | POA: Diagnosis not present

## 2017-08-14 DIAGNOSIS — I871 Compression of vein: Secondary | ICD-10-CM | POA: Diagnosis not present

## 2017-08-14 DIAGNOSIS — E1051 Type 1 diabetes mellitus with diabetic peripheral angiopathy without gangrene: Secondary | ICD-10-CM | POA: Diagnosis not present

## 2017-08-14 DIAGNOSIS — L97522 Non-pressure chronic ulcer of other part of left foot with fat layer exposed: Secondary | ICD-10-CM | POA: Diagnosis not present

## 2017-08-14 DIAGNOSIS — T82858A Stenosis of vascular prosthetic devices, implants and grafts, initial encounter: Secondary | ICD-10-CM | POA: Diagnosis not present

## 2017-08-14 DIAGNOSIS — L97521 Non-pressure chronic ulcer of other part of left foot limited to breakdown of skin: Secondary | ICD-10-CM | POA: Diagnosis not present

## 2017-08-14 DIAGNOSIS — N186 End stage renal disease: Secondary | ICD-10-CM | POA: Diagnosis not present

## 2017-08-14 DIAGNOSIS — E1040 Type 1 diabetes mellitus with diabetic neuropathy, unspecified: Secondary | ICD-10-CM | POA: Diagnosis not present

## 2017-08-14 DIAGNOSIS — I2510 Atherosclerotic heart disease of native coronary artery without angina pectoris: Secondary | ICD-10-CM | POA: Diagnosis not present

## 2017-08-14 DIAGNOSIS — E10621 Type 1 diabetes mellitus with foot ulcer: Secondary | ICD-10-CM | POA: Diagnosis not present

## 2017-08-14 DIAGNOSIS — Z992 Dependence on renal dialysis: Secondary | ICD-10-CM | POA: Diagnosis not present

## 2017-08-14 DIAGNOSIS — L89154 Pressure ulcer of sacral region, stage 4: Secondary | ICD-10-CM | POA: Diagnosis not present

## 2017-08-15 DIAGNOSIS — N186 End stage renal disease: Secondary | ICD-10-CM | POA: Diagnosis not present

## 2017-08-15 DIAGNOSIS — I5032 Chronic diastolic (congestive) heart failure: Secondary | ICD-10-CM | POA: Diagnosis not present

## 2017-08-15 DIAGNOSIS — D649 Anemia, unspecified: Secondary | ICD-10-CM | POA: Diagnosis not present

## 2017-08-15 DIAGNOSIS — E1122 Type 2 diabetes mellitus with diabetic chronic kidney disease: Secondary | ICD-10-CM | POA: Diagnosis not present

## 2017-08-15 DIAGNOSIS — M8618 Other acute osteomyelitis, other site: Secondary | ICD-10-CM | POA: Diagnosis not present

## 2017-08-15 DIAGNOSIS — Z4781 Encounter for orthopedic aftercare following surgical amputation: Secondary | ICD-10-CM | POA: Diagnosis not present

## 2017-08-15 DIAGNOSIS — L89154 Pressure ulcer of sacral region, stage 4: Secondary | ICD-10-CM | POA: Diagnosis not present

## 2017-08-15 DIAGNOSIS — I132 Hypertensive heart and chronic kidney disease with heart failure and with stage 5 chronic kidney disease, or end stage renal disease: Secondary | ICD-10-CM | POA: Diagnosis not present

## 2017-08-15 DIAGNOSIS — A499 Bacterial infection, unspecified: Secondary | ICD-10-CM | POA: Diagnosis not present

## 2017-08-18 DIAGNOSIS — D649 Anemia, unspecified: Secondary | ICD-10-CM | POA: Diagnosis not present

## 2017-08-18 DIAGNOSIS — A499 Bacterial infection, unspecified: Secondary | ICD-10-CM | POA: Diagnosis not present

## 2017-08-18 DIAGNOSIS — N186 End stage renal disease: Secondary | ICD-10-CM | POA: Diagnosis not present

## 2017-08-19 DIAGNOSIS — I5032 Chronic diastolic (congestive) heart failure: Secondary | ICD-10-CM | POA: Diagnosis not present

## 2017-08-19 DIAGNOSIS — I132 Hypertensive heart and chronic kidney disease with heart failure and with stage 5 chronic kidney disease, or end stage renal disease: Secondary | ICD-10-CM | POA: Diagnosis not present

## 2017-08-19 DIAGNOSIS — E1122 Type 2 diabetes mellitus with diabetic chronic kidney disease: Secondary | ICD-10-CM | POA: Diagnosis not present

## 2017-08-19 DIAGNOSIS — Z4781 Encounter for orthopedic aftercare following surgical amputation: Secondary | ICD-10-CM | POA: Diagnosis not present

## 2017-08-19 DIAGNOSIS — M8618 Other acute osteomyelitis, other site: Secondary | ICD-10-CM | POA: Diagnosis not present

## 2017-08-19 DIAGNOSIS — L89154 Pressure ulcer of sacral region, stage 4: Secondary | ICD-10-CM | POA: Diagnosis not present

## 2017-08-20 DIAGNOSIS — A499 Bacterial infection, unspecified: Secondary | ICD-10-CM | POA: Diagnosis not present

## 2017-08-20 DIAGNOSIS — D649 Anemia, unspecified: Secondary | ICD-10-CM | POA: Diagnosis not present

## 2017-08-20 DIAGNOSIS — N186 End stage renal disease: Secondary | ICD-10-CM | POA: Diagnosis not present

## 2017-08-21 DIAGNOSIS — R188 Other ascites: Secondary | ICD-10-CM | POA: Diagnosis not present

## 2017-08-21 DIAGNOSIS — I2510 Atherosclerotic heart disease of native coronary artery without angina pectoris: Secondary | ICD-10-CM | POA: Diagnosis not present

## 2017-08-21 DIAGNOSIS — I110 Hypertensive heart disease with heart failure: Secondary | ICD-10-CM | POA: Diagnosis not present

## 2017-08-21 DIAGNOSIS — I5032 Chronic diastolic (congestive) heart failure: Secondary | ICD-10-CM | POA: Diagnosis not present

## 2017-08-22 DIAGNOSIS — N186 End stage renal disease: Secondary | ICD-10-CM | POA: Diagnosis not present

## 2017-08-22 DIAGNOSIS — D649 Anemia, unspecified: Secondary | ICD-10-CM | POA: Diagnosis not present

## 2017-08-22 DIAGNOSIS — A499 Bacterial infection, unspecified: Secondary | ICD-10-CM | POA: Diagnosis not present

## 2017-08-25 DIAGNOSIS — D649 Anemia, unspecified: Secondary | ICD-10-CM | POA: Diagnosis not present

## 2017-08-25 DIAGNOSIS — I132 Hypertensive heart and chronic kidney disease with heart failure and with stage 5 chronic kidney disease, or end stage renal disease: Secondary | ICD-10-CM | POA: Diagnosis not present

## 2017-08-25 DIAGNOSIS — L89154 Pressure ulcer of sacral region, stage 4: Secondary | ICD-10-CM | POA: Diagnosis not present

## 2017-08-25 DIAGNOSIS — E1122 Type 2 diabetes mellitus with diabetic chronic kidney disease: Secondary | ICD-10-CM | POA: Diagnosis not present

## 2017-08-25 DIAGNOSIS — M8618 Other acute osteomyelitis, other site: Secondary | ICD-10-CM | POA: Diagnosis not present

## 2017-08-25 DIAGNOSIS — E1129 Type 2 diabetes mellitus with other diabetic kidney complication: Secondary | ICD-10-CM | POA: Diagnosis not present

## 2017-08-25 DIAGNOSIS — Z992 Dependence on renal dialysis: Secondary | ICD-10-CM | POA: Diagnosis not present

## 2017-08-25 DIAGNOSIS — I509 Heart failure, unspecified: Secondary | ICD-10-CM | POA: Diagnosis not present

## 2017-08-25 DIAGNOSIS — Z4781 Encounter for orthopedic aftercare following surgical amputation: Secondary | ICD-10-CM | POA: Diagnosis not present

## 2017-08-25 DIAGNOSIS — I5032 Chronic diastolic (congestive) heart failure: Secondary | ICD-10-CM | POA: Diagnosis not present

## 2017-08-25 DIAGNOSIS — N2581 Secondary hyperparathyroidism of renal origin: Secondary | ICD-10-CM | POA: Diagnosis not present

## 2017-08-25 DIAGNOSIS — N186 End stage renal disease: Secondary | ICD-10-CM | POA: Diagnosis not present

## 2017-08-25 DIAGNOSIS — R188 Other ascites: Secondary | ICD-10-CM | POA: Diagnosis not present

## 2017-08-25 DIAGNOSIS — L980 Pyogenic granuloma: Secondary | ICD-10-CM | POA: Diagnosis not present

## 2017-08-26 ENCOUNTER — Ambulatory Visit: Payer: Self-pay | Admitting: Surgery

## 2017-08-26 ENCOUNTER — Other Ambulatory Visit (HOSPITAL_COMMUNITY): Payer: Self-pay | Admitting: Nephrology

## 2017-08-26 DIAGNOSIS — M8618 Other acute osteomyelitis, other site: Secondary | ICD-10-CM | POA: Diagnosis not present

## 2017-08-26 DIAGNOSIS — E1122 Type 2 diabetes mellitus with diabetic chronic kidney disease: Secondary | ICD-10-CM | POA: Diagnosis not present

## 2017-08-26 DIAGNOSIS — R188 Other ascites: Secondary | ICD-10-CM

## 2017-08-26 DIAGNOSIS — Z4781 Encounter for orthopedic aftercare following surgical amputation: Secondary | ICD-10-CM | POA: Diagnosis not present

## 2017-08-26 DIAGNOSIS — I5032 Chronic diastolic (congestive) heart failure: Secondary | ICD-10-CM | POA: Diagnosis not present

## 2017-08-26 DIAGNOSIS — I132 Hypertensive heart and chronic kidney disease with heart failure and with stage 5 chronic kidney disease, or end stage renal disease: Secondary | ICD-10-CM | POA: Diagnosis not present

## 2017-08-26 DIAGNOSIS — N186 End stage renal disease: Secondary | ICD-10-CM | POA: Diagnosis not present

## 2017-08-26 DIAGNOSIS — L980 Pyogenic granuloma: Secondary | ICD-10-CM | POA: Diagnosis not present

## 2017-08-26 DIAGNOSIS — L89154 Pressure ulcer of sacral region, stage 4: Secondary | ICD-10-CM | POA: Diagnosis not present

## 2017-08-26 DIAGNOSIS — I509 Heart failure, unspecified: Secondary | ICD-10-CM | POA: Diagnosis not present

## 2017-08-26 NOTE — H&P (Signed)
CC: Referral by wound care for evaluation of ulcerated pyogenic granuloma (lobular capillary hemangioma) HPI: Ms. 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 Prolonged hospital recovery c/b development of sacral decubitus ulcer; she did undergo trach/PEG during that admission but this was all subsequently removed. She was ultimately discharged home and has been recovering relatively well. She has been undergoing large volume paracenteses since then. Her husband has been providing wound care for her decubitus ulcer and has noted dramatic improvement in the ulcer. She is also seeing wound care. She developed a growth in the bed of her wound which was preventing the wound from fully closing. 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 She was subsequently referred here for further evaluation for surgical excision. Her husband and her described issues with wound care related to this. They had difficulty packing into the wound as this lesion is here. It has grown in size over the last 1-2 months.  PMH: HTN, HLD, DM, ESRD on HD M/W/F, CHF with ascites 2/2 heart failure (LVEF 30-35%) PSH: R BKA FHx: Denies FHx of malignancy Social: Denies use of tobacco/EtOH/drugs ROS: A comprehensive 10 system review of systems was completed with the patient and pertinent findings as noted above.  The patient is a 42 year old female.   Past Surgical History Alean Rinne, [place]; 08/26/2017 10:37 AM) Cataract Surgery  Bilateral. Dialysis Shunt / Fistula   Diagnostic Studies History Alean Rinne, [place]; 08/26/2017 10:37 AM) Colonoscopy  never Mammogram  1-3 years ago Pap Smear  1-5 years ago  Allergies Sabino Gasser; 08/26/2017 10:10 AM) No Known Drug Allergies  [08/26/2017]: Allergies Reconciled   Medication History Sabino Gasser; 08/26/2017 10:11 AM) OxyCODONE HCl (10MG  Tablet, Oral) Active. LORazepam (0.5MG  Tablet, Oral) Active. AmLODIPine Besylate (10MG  Tablet, Oral) Active. Amoxicillin-Pot Clavulanate (500-125MG  Tablet, Oral) Active. Atorvastatin Calcium (40MG  Tablet, Oral) Active. Cyclobenzaprine HCl (10MG  Tablet, Oral) Active. Lantus SoloStar (100UNIT/ML Soln Pen-inj, Subcutaneous) Active. Labetalol HCl (300MG  Tablet, Oral) Active. Gabapentin (100MG  Capsule, Oral) Active. HydrALAZINE HCl (100MG  Tablet, Oral) Active. Isosorbide Mononitrate ER (120MG  Tablet ER 24HR, Oral) Active. Pantoprazole Sodium (40MG  Tablet DR, Oral) Active. Medications Reconciled  Social History Alean Rinne, [place]; 08/26/2017 10:37 AM) Caffeine use  Tea. No alcohol use  No drug use   Family History Alean Rinne, [place]; 08/26/2017 10:37 AM) Arthritis  Father. Diabetes Mellitus  Father, Mother. Hypertension  Father, Mother.  Pregnancy / Birth History Alean Rinne, [place]; 08/26/2017 10:37 AM) Age at menarche  66 years. Contraceptive History  Depo-provera. Gravida  3 Para  1  Other Problems Alean Rinne, [place]; 08/26/2017 10:37 AM) Anxiety Disorder  Chronic Obstructive Lung Disease  Congestive Heart Failure  Diabetes Mellitus  High blood pressure  Hypercholesterolemia  Transfusion history  Umbilical Hernia Repair  Vascular Disease     Review of Systems Alean Rinne RMA; 08/26/2017 10:37 AM) General Not Present- Appetite Loss, Chills, Fatigue, Fever, Night Sweats, Weight Gain and Weight Loss. Skin Present- Non-Healing Wounds. Not Present- Change in Wart/Mole, Dryness, Hives, Jaundice, New Lesions, Rash and Ulcer. HEENT Present- Wears glasses/contact lenses. Not Present- Earache, Hearing Loss, Hoarseness, Nose Bleed, Oral Ulcers, Ringing in the Ears, Seasonal Allergies, Sinus Pain, Sore Throat, Visual Disturbances and Yellow  Eyes. Respiratory Not Present- Bloody sputum, Chronic Cough, Difficulty Breathing, Snoring and Wheezing. Cardiovascular  Present- Swelling of Extremities. Not Present- Chest Pain, Difficulty Breathing Lying Down, Leg Cramps, Palpitations, Rapid Heart Rate and Shortness of Breath. Gastrointestinal Not Present- Abdominal Pain, Bloating, Bloody Stool, Change in Bowel Habits, Chronic diarrhea, Constipation, Difficulty Swallowing, Excessive gas, Gets full quickly at meals, Hemorrhoids, Indigestion, Nausea, Rectal Pain and Vomiting. Female Genitourinary Not Present- Frequency, Nocturia, Painful Urination, Pelvic Pain and Urgency. Musculoskeletal Not Present- Back Pain, Joint Pain, Joint Stiffness, Muscle Pain, Muscle Weakness and Swelling of Extremities. Neurological Not Present- Decreased Memory, Fainting, Headaches, Numbness, Seizures, Tingling, Tremor, Trouble walking and Weakness. Psychiatric Present- Anxiety. Not Present- Bipolar, Change in Sleep Pattern, Depression, Fearful and Frequent crying. Hematology Not Present- Blood Thinners, Easy Bruising, Excessive bleeding, Gland problems, HIV and Persistent Infections.  Vitals Sabino Gasser; 08/26/2017 10:12 AM) 08/26/2017 10:12 AM Weight: 170 lb Height: 76in Body Surface Area: 2.07 m Body Mass Index: 20.69 kg/m  Temp.: 97.38F  Pulse: 75 (Regular)  BP: 110/80 (Sitting, Left Arm, Standard)       Physical Exam Harrell Gave M. Leiana Rund MD; 08/26/2017 10:47 AM) The physical exam findings are as follows: Note:Constitutional: No acute distress; conversant; no deformities; R BKA noted Eyes: Moist conjunctiva; no lid lag; anicteric sclerae; pupils equal round and reactive to light Neck: Trachea midline; no palpable thyromegaly Lungs: Normal respiratory effort; no tactile fremitus CV: RRR; no palpable thrill; no pitting edema GI: Abdomen soft, nontender, distended with fluid wave MSK: mobilizes with assistance of wheel chair 2/2 RLE BKA; no  clubbing/cyanosis LLE Sacrum: Evidence of prior decubitus ulcer at this location which has contracted significantly. Wound has contracted down around a lobulated appearing soft tissue growth in the wound base. This growth has some blanching on palpation. The wound edges appear healthy and uninvolved by this growth. There is no surrounding erythema. There is no purulent discharge from the wound. Psychiatric: Appropriate affect; alert and oriented 3 Lymphatic: No palpable cervical or axillary lymphadenopathy **A chaperone, Lennart Pall, was present for the entire physical exam    Assessment & Plan Harrell Gave M. 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 Lines is a very pleasant 5yoF with history of hypertension, hyperlipidemia, CHF, ESRD on HD M/W/F, cardiac ascites, PAD, PVD status post right BKA, give by cardiac arrest and prolonged admission. She subsequently developed a sacral decubitus ulcer which has been healing well at home. Her husband has been assisting in care of this. She has subsequently developed a growth in the bed of the wound which wound care is biopsied and results returned ulcerated pyogenic granuloma (lobular capillary hemangioma) no malignancy identified -This growth has been causing some issues with wound care and inability for the wound to fully contract. We discussed the anatomy physiology of the sacrum as well as the pathophysiology of these lesions. We discussed the biopsy results thus far have not shown any evidence of malignancy and that this likely represents a benign lesion related to trauma from the decubitus ulcer. We discussed that without surgery this will potentially grow in size but based on the results we currently have available does not appear to be something that would be life-threatening. Due to issues with wound healing and wound care  related to this and it being a nuisance they have requested to proceed with surgical excision. We discussed the technical aspects of the procedure, material risks (including, but not limited to, pain, bleeding, need for blood transfusion, recurrence, scarring, damage to surrounding structures and  underlying bone, chronic pain, heart attack, stroke, death) benefits and alternatives to surgery including further observation. Her questions are answered to their satisfaction, they voiced understanding, and requested to proceed with surgery. -Prior to scheduling for surgery I will request cardiac clearance from her cardiologist.  Signed electronically by Ileana Roup, MD (08/26/2017 10:54 AM)

## 2017-08-27 ENCOUNTER — Ambulatory Visit: Payer: Medicare Other | Admitting: Infectious Diseases

## 2017-08-27 DIAGNOSIS — D649 Anemia, unspecified: Secondary | ICD-10-CM | POA: Diagnosis not present

## 2017-08-27 DIAGNOSIS — N186 End stage renal disease: Secondary | ICD-10-CM | POA: Diagnosis not present

## 2017-08-27 DIAGNOSIS — N2581 Secondary hyperparathyroidism of renal origin: Secondary | ICD-10-CM | POA: Diagnosis not present

## 2017-08-28 ENCOUNTER — Ambulatory Visit (HOSPITAL_COMMUNITY): Payer: Medicare Other

## 2017-08-28 ENCOUNTER — Encounter (HOSPITAL_BASED_OUTPATIENT_CLINIC_OR_DEPARTMENT_OTHER): Payer: Medicare Other | Attending: Internal Medicine

## 2017-08-28 DIAGNOSIS — L980 Pyogenic granuloma: Secondary | ICD-10-CM | POA: Diagnosis not present

## 2017-08-28 DIAGNOSIS — L97211 Non-pressure chronic ulcer of right calf limited to breakdown of skin: Secondary | ICD-10-CM | POA: Diagnosis not present

## 2017-08-28 DIAGNOSIS — L89622 Pressure ulcer of left heel, stage 2: Secondary | ICD-10-CM | POA: Insufficient documentation

## 2017-08-28 DIAGNOSIS — S81802A Unspecified open wound, left lower leg, initial encounter: Secondary | ICD-10-CM | POA: Diagnosis not present

## 2017-08-28 DIAGNOSIS — E1022 Type 1 diabetes mellitus with diabetic chronic kidney disease: Secondary | ICD-10-CM | POA: Diagnosis not present

## 2017-08-28 DIAGNOSIS — I2510 Atherosclerotic heart disease of native coronary artery without angina pectoris: Secondary | ICD-10-CM | POA: Diagnosis not present

## 2017-08-28 DIAGNOSIS — S91115A Laceration without foreign body of left lesser toe(s) without damage to nail, initial encounter: Secondary | ICD-10-CM | POA: Insufficient documentation

## 2017-08-28 DIAGNOSIS — S91302A Unspecified open wound, left foot, initial encounter: Secondary | ICD-10-CM | POA: Diagnosis not present

## 2017-08-28 DIAGNOSIS — N186 End stage renal disease: Secondary | ICD-10-CM | POA: Diagnosis not present

## 2017-08-28 DIAGNOSIS — L89154 Pressure ulcer of sacral region, stage 4: Secondary | ICD-10-CM | POA: Insufficient documentation

## 2017-08-28 DIAGNOSIS — L97522 Non-pressure chronic ulcer of other part of left foot with fat layer exposed: Secondary | ICD-10-CM | POA: Insufficient documentation

## 2017-08-28 DIAGNOSIS — I509 Heart failure, unspecified: Secondary | ICD-10-CM | POA: Insufficient documentation

## 2017-08-28 DIAGNOSIS — E1040 Type 1 diabetes mellitus with diabetic neuropathy, unspecified: Secondary | ICD-10-CM | POA: Insufficient documentation

## 2017-08-28 DIAGNOSIS — X58XXXA Exposure to other specified factors, initial encounter: Secondary | ICD-10-CM | POA: Insufficient documentation

## 2017-08-28 DIAGNOSIS — I132 Hypertensive heart and chronic kidney disease with heart failure and with stage 5 chronic kidney disease, or end stage renal disease: Secondary | ICD-10-CM | POA: Diagnosis not present

## 2017-08-29 ENCOUNTER — Ambulatory Visit (HOSPITAL_COMMUNITY)
Admission: RE | Admit: 2017-08-29 | Discharge: 2017-08-29 | Disposition: A | Discharge status: Home / Self Care | Payer: Medicare Other | Source: Ambulatory Visit | Attending: Nephrology | Admitting: Nephrology

## 2017-08-29 ENCOUNTER — Encounter (HOSPITAL_COMMUNITY): Payer: Self-pay | Admitting: Student

## 2017-08-29 DIAGNOSIS — N186 End stage renal disease: Secondary | ICD-10-CM | POA: Diagnosis not present

## 2017-08-29 DIAGNOSIS — D649 Anemia, unspecified: Secondary | ICD-10-CM | POA: Diagnosis not present

## 2017-08-29 DIAGNOSIS — R188 Other ascites: Secondary | ICD-10-CM | POA: Insufficient documentation

## 2017-08-29 DIAGNOSIS — N2581 Secondary hyperparathyroidism of renal origin: Secondary | ICD-10-CM | POA: Diagnosis not present

## 2017-08-29 HISTORY — PX: IR PARACENTESIS: IMG2679

## 2017-08-29 MED ORDER — LIDOCAINE HCL (PF) 1 % IJ SOLN
INTRAMUSCULAR | Status: DC | PRN
  Administered 2017-08-29: 8 mL

## 2017-08-29 MED ORDER — LIDOCAINE HCL (PF) 2 % IJ SOLN
INTRAMUSCULAR | Status: AC
  Filled 2017-08-29: qty 10

## 2017-09-01 ENCOUNTER — Encounter: Payer: Self-pay | Admitting: Surgery

## 2017-09-01 ENCOUNTER — Other Ambulatory Visit: Payer: Self-pay

## 2017-09-01 ENCOUNTER — Ambulatory Visit (INDEPENDENT_AMBULATORY_CARE_PROVIDER_SITE_OTHER): Payer: Self-pay | Admitting: Surgery

## 2017-09-01 VITALS — BP 154/80 | HR 77 | Temp 98.4°F | Resp 18 | Ht 76.0 in | Wt 191.0 lb

## 2017-09-01 DIAGNOSIS — N186 End stage renal disease: Secondary | ICD-10-CM | POA: Diagnosis not present

## 2017-09-01 DIAGNOSIS — N2581 Secondary hyperparathyroidism of renal origin: Secondary | ICD-10-CM | POA: Diagnosis not present

## 2017-09-01 DIAGNOSIS — D649 Anemia, unspecified: Secondary | ICD-10-CM | POA: Diagnosis not present

## 2017-09-01 DIAGNOSIS — Z89511 Acquired absence of right leg below knee: Secondary | ICD-10-CM

## 2017-09-01 NOTE — Progress Notes (Signed)
POST OPERATIVE OFFICE NOTE    CC:  F/u for surgery  HPI:  This is a 42 y.o. female who is s/p who is s/p underwent a right CFA, profunda, SFA and external iliac endarterectomy with bovine patch angioplasty on 04/16/17 and during that time, she became unstable and received CPR &was transferred to the ICU. She subsequently was neuro in tact and pressors weaned. She eventually had a tracheostomy. A JP drain was placed during surgery and was left for quite some time due to increased drainage. The was eventually removed. She did have healing issues in her right groin and needed extensive wound care. Once she became more stable, she underwent a right TMA and exploration of her groin with evacuation of seroma and wound vac placement on 05/09/17. She had gone to [facility] and was discharged from there on January 15th. She has been weaned from the trach and is now on room air.   She subsequently underwent a right BKA on 06/17/17 by Dr. Trula Slade.  At discharge, she went to [facility] and has been home for a couple of months.    She states that she is doing well.  She is doing well with dialysis.  Her husband states that after the last visit, he started using Vitamin E on his incision and it started to weep so he stopped and it has been dry since.  She presents today for follow up wound check.    Allergies  Allergen Reactions  . Cephalexin Other (See Comments)    Reaction unknown- Childhood allergy Tolerated Ceftriaxone in the past  . Sulfamethoxazole-Trimethoprim Other (See Comments)    Unknown reaction. Pt states that she was told by her mother that she had allergy to Bactrim as a child.    Current Outpatient Medications  Medication Sig Dispense Refill  . acetaminophen (TYLENOL) 325 MG tablet Take 1-2 tablets (325-650 mg total) by mouth every 4 (four) hours as needed for mild pain.    Marland Kitchen amLODipine (NORVASC) 10 MG tablet Take 1 tablet (10 mg total) by mouth daily after supper. 30 tablet 0  .  atorvastatin (LIPITOR) 40 MG tablet Take 1 tablet (40 mg total) by mouth daily at 6 PM. 30 tablet 0  . b complex-vitamin c-folic acid (NEPHRO-VITE) 0.8 MG TABS tablet Take 1 tablet by mouth daily.    . cefTAZidime 2 g in sodium chloride 0.9 % 100 mL Inject 2 g into the vein every Monday, Wednesday, and Friday with hemodialysis.    Marland Kitchen cinacalcet (SENSIPAR) 30 MG tablet Take 2 tablets (60 mg total) by mouth daily with supper. 60 tablet 0  . cloNIDine (CATAPRES) 0.3 MG tablet Take 1 tablet (0.3 mg total) by mouth 3 (three) times daily. 90 tablet 0  . docusate (COLACE) 50 MG/5ML liquid Take 100 mg by mouth 3 (three) times daily.    . ferric citrate (AURYXIA) 1 GM 210 MG(Fe) tablet Take 2 tablets (420 mg total) by mouth 3 (three) times daily with meals. 270 tablet 0  . gabapentin (NEURONTIN) 100 MG capsule Take 1 capsule (100 mg total) by mouth 2 (two) times daily. 60 capsule 0  . hydrALAZINE (APRESOLINE) 100 MG tablet Take 1 tablet (100 mg total) by mouth every 8 (eight) hours. 90 tablet 1  . Insulin Glargine (LANTUS SOLOSTAR) 100 UNIT/ML Solostar Pen Inject 10 Units into the skin daily at 10 pm. 15 mL 11  . isosorbide dinitrate (ISORDIL) 30 MG tablet Take 2 tablets (60 mg total) by mouth 2 (two) times  daily. Give two 30 mg tablets to = 60 mg BID 120 tablet 0  . labetalol (NORMODYNE) 300 MG tablet Take 1 tablet (300 mg total) by mouth 3 (three) times daily. 90 tablet 0  . LORazepam (ATIVAN) 0.5 MG tablet Take 1 tablet (0.5 mg total) by mouth every 12 (twelve) hours as needed for anxiety. 30 tablet 0  . Methoxy PEG-Epoetin Beta (MIRCERA IJ) Inject 225 mcg as directed every 21 ( twenty-one) days. (While at dialysis)    . metoCLOPramide (REGLAN) 5 MG tablet Take 0.5 tablets (2.5 mg total) by mouth 3 (three) times daily before meals. 45 tablet 0  . ondansetron (ZOFRAN) 4 MG tablet Take 4 mg by mouth every 6 (six) hours as needed for nausea or vomiting.    Marland Kitchen oxyCODONE 10 MG TABS Take 0.5-1 tablets (5-10 mg  total) by mouth every 8 (eight) hours as needed for severe pain. (Patient not taking: Reported on 08/04/2017) 30 tablet 0  . pantoprazole (PROTONIX) 40 MG tablet Take 1 tablet (40 mg total) by mouth daily. 30 tablet 0  . sertraline (ZOLOFT) 50 MG tablet Take 1 tablet (50 mg total) by mouth daily. 30 tablet 0  . sevelamer carbonate (RENVELA) 800 MG tablet Take 3 tablets (2,400 mg total) by mouth 3 (three) times daily with meals. Give 3 tablets to = 2400 mg TID 270 tablet 0  . traMADol (ULTRAM) 50 MG tablet Take 1 tablet (50 mg total) by mouth every 6 (six) hours as needed for moderate pain. 30 tablet 0  . vancomycin (VANCOCIN) 1-5 GM/200ML-% SOLN Inject 200 mLs (1,000 mg total) into the vein every Monday, Wednesday, and Friday with hemodialysis. 4000 mL    No current facility-administered medications for this visit.      ROS:  See HPI  Physical Exam:  There were no vitals filed for this visit.  Incision:       Post debridement 09/01/17   Assessment/Plan:  This is a 42 y.o. female who is s/p: Right BKA 06/17/17  -Dr. Trula Slade debrided the lateral portion of the wound.  Will use Ag alginate on wound daily. She is not out of the woods and may need further debridement in the OR, but will stay conservative for now.  Will see her back in 4 weeks for wound check.  -she is making progress with straightening the knee  -BP improved from last visit   [name], PA-C [facility] [phone]  Clinic MD:  Pt seen and examined with Dr. Trula Slade

## 2017-09-02 ENCOUNTER — Ambulatory Visit
Admission: RE | Admit: 2017-09-02 | Discharge: 2017-09-02 | Disposition: A | Discharge status: Home / Self Care | Payer: Medicare Other | Source: Ambulatory Visit | Attending: Internal Medicine | Admitting: Internal Medicine

## 2017-09-02 ENCOUNTER — Other Ambulatory Visit: Payer: Self-pay | Admitting: Internal Medicine

## 2017-09-02 DIAGNOSIS — M7989 Other specified soft tissue disorders: Secondary | ICD-10-CM | POA: Diagnosis not present

## 2017-09-02 DIAGNOSIS — E113593 Type 2 diabetes mellitus with proliferative diabetic retinopathy without macular edema, bilateral: Secondary | ICD-10-CM | POA: Diagnosis not present

## 2017-09-02 DIAGNOSIS — R52 Pain, unspecified: Secondary | ICD-10-CM

## 2017-09-02 DIAGNOSIS — M79662 Pain in left lower leg: Secondary | ICD-10-CM | POA: Diagnosis not present

## 2017-09-02 DIAGNOSIS — L03116 Cellulitis of left lower limb: Secondary | ICD-10-CM | POA: Diagnosis not present

## 2017-09-02 DIAGNOSIS — M79605 Pain in left leg: Secondary | ICD-10-CM

## 2017-09-02 DIAGNOSIS — Z961 Presence of intraocular lens: Secondary | ICD-10-CM | POA: Diagnosis not present

## 2017-09-02 DIAGNOSIS — T148XXA Other injury of unspecified body region, initial encounter: Secondary | ICD-10-CM

## 2017-09-02 DIAGNOSIS — R609 Edema, unspecified: Secondary | ICD-10-CM

## 2017-09-02 DIAGNOSIS — S8992XA Unspecified injury of left lower leg, initial encounter: Secondary | ICD-10-CM | POA: Diagnosis not present

## 2017-09-03 DIAGNOSIS — N2581 Secondary hyperparathyroidism of renal origin: Secondary | ICD-10-CM | POA: Diagnosis not present

## 2017-09-03 DIAGNOSIS — D649 Anemia, unspecified: Secondary | ICD-10-CM | POA: Diagnosis not present

## 2017-09-03 DIAGNOSIS — N186 End stage renal disease: Secondary | ICD-10-CM | POA: Diagnosis not present

## 2017-09-05 ENCOUNTER — Ambulatory Visit (HOSPITAL_COMMUNITY): Payer: Medicare Other

## 2017-09-05 DIAGNOSIS — L97522 Non-pressure chronic ulcer of other part of left foot with fat layer exposed: Secondary | ICD-10-CM | POA: Diagnosis not present

## 2017-09-05 DIAGNOSIS — N2581 Secondary hyperparathyroidism of renal origin: Secondary | ICD-10-CM | POA: Diagnosis not present

## 2017-09-05 DIAGNOSIS — S31000A Unspecified open wound of lower back and pelvis without penetration into retroperitoneum, initial encounter: Secondary | ICD-10-CM | POA: Diagnosis not present

## 2017-09-05 DIAGNOSIS — S81802A Unspecified open wound, left lower leg, initial encounter: Secondary | ICD-10-CM | POA: Diagnosis not present

## 2017-09-05 DIAGNOSIS — L97211 Non-pressure chronic ulcer of right calf limited to breakdown of skin: Secondary | ICD-10-CM | POA: Diagnosis not present

## 2017-09-05 DIAGNOSIS — L89622 Pressure ulcer of left heel, stage 2: Secondary | ICD-10-CM | POA: Diagnosis not present

## 2017-09-05 DIAGNOSIS — S91302A Unspecified open wound, left foot, initial encounter: Secondary | ICD-10-CM | POA: Diagnosis not present

## 2017-09-05 DIAGNOSIS — D649 Anemia, unspecified: Secondary | ICD-10-CM | POA: Diagnosis not present

## 2017-09-05 DIAGNOSIS — S91105A Unspecified open wound of left lesser toe(s) without damage to nail, initial encounter: Secondary | ICD-10-CM | POA: Diagnosis not present

## 2017-09-05 DIAGNOSIS — E109 Type 1 diabetes mellitus without complications: Secondary | ICD-10-CM | POA: Diagnosis not present

## 2017-09-05 DIAGNOSIS — N186 End stage renal disease: Secondary | ICD-10-CM | POA: Diagnosis not present

## 2017-09-05 DIAGNOSIS — S91102A Unspecified open wound of left great toe without damage to nail, initial encounter: Secondary | ICD-10-CM | POA: Diagnosis not present

## 2017-09-05 DIAGNOSIS — S91115A Laceration without foreign body of left lesser toe(s) without damage to nail, initial encounter: Secondary | ICD-10-CM | POA: Diagnosis not present

## 2017-09-05 DIAGNOSIS — I132 Hypertensive heart and chronic kidney disease with heart failure and with stage 5 chronic kidney disease, or end stage renal disease: Secondary | ICD-10-CM | POA: Diagnosis not present

## 2017-09-05 DIAGNOSIS — L89154 Pressure ulcer of sacral region, stage 4: Secondary | ICD-10-CM | POA: Diagnosis not present

## 2017-09-06 DIAGNOSIS — Z4781 Encounter for orthopedic aftercare following surgical amputation: Secondary | ICD-10-CM | POA: Diagnosis not present

## 2017-09-06 DIAGNOSIS — I132 Hypertensive heart and chronic kidney disease with heart failure and with stage 5 chronic kidney disease, or end stage renal disease: Secondary | ICD-10-CM | POA: Diagnosis not present

## 2017-09-06 DIAGNOSIS — I5032 Chronic diastolic (congestive) heart failure: Secondary | ICD-10-CM | POA: Diagnosis not present

## 2017-09-06 DIAGNOSIS — M8618 Other acute osteomyelitis, other site: Secondary | ICD-10-CM | POA: Diagnosis not present

## 2017-09-06 DIAGNOSIS — E1122 Type 2 diabetes mellitus with diabetic chronic kidney disease: Secondary | ICD-10-CM | POA: Diagnosis not present

## 2017-09-06 DIAGNOSIS — L89154 Pressure ulcer of sacral region, stage 4: Secondary | ICD-10-CM | POA: Diagnosis not present

## 2017-09-08 DIAGNOSIS — D649 Anemia, unspecified: Secondary | ICD-10-CM | POA: Diagnosis not present

## 2017-09-08 DIAGNOSIS — N186 End stage renal disease: Secondary | ICD-10-CM | POA: Diagnosis not present

## 2017-09-08 DIAGNOSIS — N2581 Secondary hyperparathyroidism of renal origin: Secondary | ICD-10-CM | POA: Diagnosis not present

## 2017-09-09 ENCOUNTER — Other Ambulatory Visit: Payer: Self-pay

## 2017-09-09 DIAGNOSIS — I5032 Chronic diastolic (congestive) heart failure: Secondary | ICD-10-CM | POA: Diagnosis not present

## 2017-09-09 DIAGNOSIS — T8789 Other complications of amputation stump: Secondary | ICD-10-CM | POA: Diagnosis not present

## 2017-09-09 DIAGNOSIS — I2510 Atherosclerotic heart disease of native coronary artery without angina pectoris: Secondary | ICD-10-CM | POA: Diagnosis not present

## 2017-09-09 DIAGNOSIS — E1122 Type 2 diabetes mellitus with diabetic chronic kidney disease: Secondary | ICD-10-CM | POA: Diagnosis not present

## 2017-09-09 DIAGNOSIS — I132 Hypertensive heart and chronic kidney disease with heart failure and with stage 5 chronic kidney disease, or end stage renal disease: Secondary | ICD-10-CM | POA: Diagnosis not present

## 2017-09-09 DIAGNOSIS — L89154 Pressure ulcer of sacral region, stage 4: Secondary | ICD-10-CM | POA: Diagnosis not present

## 2017-09-09 NOTE — Patient Outreach (Signed)
[facility]) Care Management   09/09/2017  CLOVER FEEHAN 21-Feb-1976 [id number]  ZOIEE WIMMER is an 42 y.o. female  Subjective: client reports she is better, reports she is scheduled to have an outpatient procedure to remove some tissue in the wound to coccyx.  Objective:  BP (!) 160/78   Pulse 74   Resp 20   Ht 1.93 m (6\' 4" )   Wt 170 lb (77.1 kg) Comment: patient reported  LMP 10/09/2015 Comment: pt on dialysis  SpO2 93%   BMI 20.69 kg/m   Review of Systems  Respiratory:       Faint end inspiratory crackles noted.  Cardiovascular: Negative.   Genitourinary:       Hemodialysis patient.  Skin:       Scratch across front of left leg noted, also noted wound approximately quarter in size with some swelling in surrounding tissue. Large blister area to back of left heel noted. And right below the knee incision slow healing.    Physical Exam  Encounter Medications:   Outpatient Encounter Medications as of 09/09/2017  Medication Sig Note  . acetaminophen (TYLENOL) 325 MG tablet Take 1-2 tablets (325-650 mg total) by mouth every 4 (four) hours as needed for mild pain.   Marland Kitchen amLODipine (NORVASC) 10 MG tablet Take 1 tablet (10 mg total) by mouth daily after supper.   Marland Kitchen atorvastatin (LIPITOR) 40 MG tablet Take 1 tablet (40 mg total) by mouth daily at 6 PM.   . b complex-vitamin c-folic acid (NEPHRO-VITE) 0.8 MG TABS tablet Take 1 tablet by mouth daily.   . cefTAZidime 2 g in sodium chloride 0.9 % 100 mL Inject 2 g into the vein every Monday, Wednesday, and Friday with hemodialysis.   Marland Kitchen cinacalcet (SENSIPAR) 30 MG tablet Take 2 tablets (60 mg total) by mouth daily with supper.   . cloNIDine (CATAPRES) 0.3 MG tablet Take 1 tablet (0.3 mg total) by mouth 3 (three) times daily.   Marland Kitchen docusate (COLACE) 50 MG/5ML liquid Take 100 mg by mouth 3 (three) times daily.   . ferric citrate (AURYXIA) 1 GM 210 MG(Fe) tablet Take 2 tablets (420 mg total) by mouth 3 (three) times daily  with meals.   . gabapentin (NEURONTIN) 100 MG capsule Take 1 capsule (100 mg total) by mouth 2 (two) times daily.   . hydrALAZINE (APRESOLINE) 100 MG tablet Take 1 tablet (100 mg total) by mouth every 8 (eight) hours.   . Insulin Glargine (LANTUS SOLOSTAR) 100 UNIT/ML Solostar Pen Inject 10 Units into the skin daily at 10 pm. 07/24/2017: Reported 20 units in the morning and 10 units at night.  . isosorbide dinitrate (ISORDIL) 30 MG tablet Take 2 tablets (60 mg total) by mouth 2 (two) times daily. Give two 30 mg tablets to = 60 mg BID   . labetalol (NORMODYNE) 300 MG tablet Take 1 tablet (300 mg total) by mouth 3 (three) times daily.   Marland Kitchen LORazepam (ATIVAN) 0.5 MG tablet Take 1 tablet (0.5 mg total) by mouth every 12 (twelve) hours as needed for anxiety.   . Methoxy PEG-Epoetin Beta (MIRCERA IJ) Inject 225 mcg as directed every 21 ( twenty-one) days. (While at dialysis)   . metoCLOPramide (REGLAN) 5 MG tablet Take 0.5 tablets (2.5 mg total) by mouth 3 (three) times daily before meals.   . ondansetron (ZOFRAN) 4 MG tablet Take 4 mg by mouth every 6 (six) hours as needed for nausea or vomiting.   Marland Kitchen oxyCODONE 10 MG  TABS Take 0.5-1 tablets (5-10 mg total) by mouth every 8 (eight) hours as needed for severe pain. (Patient not taking: Reported on 08/04/2017) 07/31/2017: Last dose 4-5 days ago  . pantoprazole (PROTONIX) 40 MG tablet Take 1 tablet (40 mg total) by mouth daily.   . sertraline (ZOLOFT) 50 MG tablet Take 1 tablet (50 mg total) by mouth daily.   . sevelamer carbonate (RENVELA) 800 MG tablet Take 3 tablets (2,400 mg total) by mouth 3 (three) times daily with meals. Give 3 tablets to = 2400 mg TID   . traMADol (ULTRAM) 50 MG tablet Take 1 tablet (50 mg total) by mouth every 6 (six) hours as needed for moderate pain. (Patient not taking: Reported on 09/01/2017) 07/31/2017: Last dose last pm  . vancomycin (VANCOCIN) 1-5 GM/200ML-% SOLN Inject 200 mLs (1,000 mg total) into the vein every Monday, Wednesday, and  Friday with hemodialysis.    No facility-administered encounter medications on file as of 09/09/2017.     Functional Status:   In your present state of health, do you have any difficulty performing the following activities: 07/15/2017 06/23/2017  Hearing? N N  Vision? N N  Difficulty concentrating or making decisions? N N  Walking or climbing stairs? Y Y  Dressing or bathing? Y Y  Doing errands, shopping? [place] Don[place]  Preparing Food and eating ? N -  Using the Toilet? N -  In the past six months, have you accidently leaked urine? N -  Do you have problems with loss of bowel control? N -  Managing your Medications? Y -  Managing your Finances? N -  Housekeeping or managing your Housekeeping? Y -  Some recent data might be hidden    Fall/Depression Screening:    Fall Risk  07/31/2017 07/22/2017 07/15/2017  Falls in the past year? No No No  Number falls in past yr: - - -  Injury with Fall? 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 Admission 1/16-1/28 -disposition rehabilitation. Admission 1/28 to inpatient rehabilitation and discharged to home on 07/10/17.  [place] for wound care continues to be involved.  Home health nurse, Estill Bamberg, came to see client during [facility] visit.  Right BKA followed by to vascular surgery. Per home health nurse new orders to clean with saline and redressing.  Per client she is going to have outpatient surgery to wound on bottom on May 9th-per home health nurse dressing change to sacrum with calcium alginate to bottom for dressing changes.  Re: wounds to the left leg. Client states left  leg wounds happened when her leg fell in between the bed and the wheelchair while she was sleeping. She reports wound care clinic has seen these areas. She reports areas are getting better.   Client continues to attend dialysis treatments three times/week. Wounds managed by vesicular surgeon, the wound care clinic and home health. Client reports he husband is also assisting in dressing changes.  Client to call RNCM as needed. RNCM reinforces the 24 hour nurse advice line and encouraged to call as needed.  Per client, outpatient procedure on sacral wound next month.  Plan: follow up next month telephonically post procedure.  Thea Silversmith, RN, MSN, [place] Coordinator Cell: [phone]

## 2017-09-10 DIAGNOSIS — N186 End stage renal disease: Secondary | ICD-10-CM | POA: Diagnosis not present

## 2017-09-10 DIAGNOSIS — N2581 Secondary hyperparathyroidism of renal origin: Secondary | ICD-10-CM | POA: Diagnosis not present

## 2017-09-10 DIAGNOSIS — D649 Anemia, unspecified: Secondary | ICD-10-CM | POA: Diagnosis not present

## 2017-09-11 ENCOUNTER — Other Ambulatory Visit: Payer: Self-pay

## 2017-09-11 ENCOUNTER — Encounter: Payer: Self-pay | Admitting: Physical Medicine & Rehabilitation

## 2017-09-11 ENCOUNTER — Encounter
Payer: BLUE CROSS/BLUE SHIELD | Attending: Physical Medicine & Rehabilitation | Admitting: Physical Medicine & Rehabilitation

## 2017-09-11 VITALS — BP 184/90 | HR 76 | Ht 76.0 in | Wt 170.0 lb

## 2017-09-11 DIAGNOSIS — I169 Hypertensive crisis, unspecified: Secondary | ICD-10-CM | POA: Diagnosis not present

## 2017-09-11 DIAGNOSIS — F419 Anxiety disorder, unspecified: Secondary | ICD-10-CM | POA: Diagnosis not present

## 2017-09-11 DIAGNOSIS — I132 Hypertensive heart and chronic kidney disease with heart failure and with stage 5 chronic kidney disease, or end stage renal disease: Secondary | ICD-10-CM | POA: Insufficient documentation

## 2017-09-11 DIAGNOSIS — R188 Other ascites: Secondary | ICD-10-CM | POA: Diagnosis not present

## 2017-09-11 DIAGNOSIS — Z992 Dependence on renal dialysis: Secondary | ICD-10-CM | POA: Insufficient documentation

## 2017-09-11 DIAGNOSIS — E1122 Type 2 diabetes mellitus with diabetic chronic kidney disease: Secondary | ICD-10-CM | POA: Insufficient documentation

## 2017-09-11 DIAGNOSIS — L89154 Pressure ulcer of sacral region, stage 4: Secondary | ICD-10-CM | POA: Diagnosis not present

## 2017-09-11 DIAGNOSIS — N186 End stage renal disease: Secondary | ICD-10-CM | POA: Diagnosis not present

## 2017-09-11 DIAGNOSIS — M4628 Osteomyelitis of vertebra, sacral and sacrococcygeal region: Secondary | ICD-10-CM | POA: Diagnosis not present

## 2017-09-11 DIAGNOSIS — L89329 Pressure ulcer of left buttock, unspecified stage: Secondary | ICD-10-CM | POA: Diagnosis not present

## 2017-09-11 DIAGNOSIS — G939 Disorder of brain, unspecified: Secondary | ICD-10-CM

## 2017-09-11 DIAGNOSIS — M869 Osteomyelitis, unspecified: Secondary | ICD-10-CM

## 2017-09-11 DIAGNOSIS — I509 Heart failure, unspecified: Secondary | ICD-10-CM | POA: Insufficient documentation

## 2017-09-11 DIAGNOSIS — I2510 Atherosclerotic heart disease of native coronary artery without angina pectoris: Secondary | ICD-10-CM | POA: Diagnosis not present

## 2017-09-11 DIAGNOSIS — E781 Pure hyperglyceridemia: Secondary | ICD-10-CM | POA: Insufficient documentation

## 2017-09-11 DIAGNOSIS — R531 Weakness: Secondary | ICD-10-CM | POA: Diagnosis not present

## 2017-09-11 DIAGNOSIS — E1169 Type 2 diabetes mellitus with other specified complication: Secondary | ICD-10-CM

## 2017-09-11 DIAGNOSIS — Z89511 Acquired absence of right leg below knee: Secondary | ICD-10-CM

## 2017-09-11 DIAGNOSIS — G9389 Other specified disorders of brain: Secondary | ICD-10-CM

## 2017-09-11 DIAGNOSIS — Z87891 Personal history of nicotine dependence: Secondary | ICD-10-CM | POA: Diagnosis not present

## 2017-09-11 DIAGNOSIS — E1143 Type 2 diabetes mellitus with diabetic autonomic (poly)neuropathy: Secondary | ICD-10-CM | POA: Insufficient documentation

## 2017-09-11 NOTE — Progress Notes (Signed)
Subjective:    Patient ID: [name], female    DOB: 06-20-1975, 42 y.o.   MRN: [id number]  HPI 42 y.o. female with histoyr of CAD, ESRD, T2DM, right foot wounds s/p revascularization complicated hospital course 03/2017, sacral decub and RLE ischemia requiring right metatarsal amputation with exploration of right groin wound 04/2017 presents for follow up for right BKA as well as multiple other medical issues.   Last clinic visit 07/31/17.  Since that time, pt states she is still in therapies, but has not seen anyone in 2 weeks.  She continues to follow up with Vascular.  She is going to have surgery on her decubitus ulcer 5/9.  She CBGs have been ~170s.  BP remains elevated, but she states it is better controlled at home.   She is scheduled to have paracentesis this week.  She never followed with Neurosurg and states she was told it did not require immediate attention.   Pain Inventory Average Pain 2 Pain Right Now 0 My pain is burning  In the last 24 hours, has pain interfered with the following? General activity 2 Relation with others 9 Enjoyment of life 5 What TIME of day is your pain at its worst? night Sleep (in general) Fair  Pain is worse with: sitting Pain improves with: rest Relief from Meds: 4  Mobility ability to climb steps?  no do you drive?  no use a wheelchair transfers alone  Function disabled: date disabled 2009  Neuro/Psych anxiety  Prior Studies Any changes since last visit?  no  Physicians involved in your care Any changes since last visit?  no   Family History  Problem Relation Age of Onset  . Diabetes Mother   . Hypertension Mother   . Diabetes Father   . Hyperlipidemia Father   . Hypertension Father   . Diabetes Sister   . Stroke Maternal Grandmother   . Diabetes Sister   . Breast cancer Maternal Aunt        Age 85's   Social History   Socioeconomic History  . Marital status: Married    Spouse name: Not on file  . Number of  children: Not on file  . Years of education: Not on file  . Highest education level: Not on file  Occupational History    Employer: [employer]  . Financial resource strain: Not on file  . Food insecurity:    Worry: Not on file    Inability: Not on file  . Transportation needs:    Medical: Not on file    Non-medical: Not on file  Tobacco Use  . Smoking status: Former Smoker    Packs/day: 0.30    Years: 10.00    Pack years: 3.00    Types: Cigarettes    Last attempt to quit: 08/05/2011    Years since quitting: 6.1  . Smokeless tobacco: Never Used  Substance and Sexual Activity  . Alcohol use: No  . Drug use: No  . Sexual activity: Not on file  Lifestyle  . Physical activity:    Days per week: Not on file    Minutes per session: Not on file  . Stress: Not on file  Relationships  . Social connections:    Talks on phone: Not on file    Gets together: Not on file    Attends religious service: Not on file    Active member of club or organization: Not on file    Attends meetings of  clubs or organizations: Not on file    Relationship status: Not on file  Other Topics Concern  . Not on file  Social History Narrative  . Not on file   Past Surgical History:  Procedure Laterality Date  . ABDOMINAL AORTOGRAM W/LOWER EXTREMITY [name] 03/27/2017   Procedure: ABDOMINAL AORTOGRAM W/LOWER EXTREMITY;  Surgeon: Conrad Orangevale, MD;  Location: [facility];  Service: Cardiovascular;  Laterality: [name];  . AMPUTATION Right 05/09/2017   Procedure: AMPUTATION TRANSMETATARSAL OF RIGHT FOOT;  Surgeon: Serafina Mitchell, MD;  Location: [facility];  Service: Vascular;  Laterality: Right;  . AMPUTATION Right 06/17/2017   Procedure: AMPUTATION BELOW KNEE RIGHT;  Surgeon: Serafina Mitchell, MD;  Location[facility];  Service: Vascular;  Laterality: Right;  . AV FISTULA PLACEMENT Left 04/2010   "lower arm"  . CARDIAC CATHETERIZATION [name] 02/10/2015   Procedure: Left Heart Cath and Coronary  Angiography;  Surgeon: Dixie Dials, MD;  Location: [facility];  Service: Cardiovascular;  Laterality: [name];  . CARDIAC CATHETERIZATION [name] 02/21/2016   Procedure: Right Heart Cath;  Surgeon: Jolaine Artist, MD;  Location: [facility];  Service: Cardiovascular;  Laterality: [name];  . CATARACT EXTRACTION W/ INTRAOCULAR LENS  IMPLANT, BILATERAL Bilateral   . CERVICAL BIOPSY  W/ LOOP ELECTRODE EXCISION     h/o  . CERVICAL CONE BIOPSY     h/o  . COLPOSCOPY    . DILATION AND CURETTAGE OF UTERUS  2009  . ENDARTERECTOMY FEMORAL Right 04/16/2017   Procedure: RIGHT FEMORAL ENDARTERECTOMY;  Surgeon: Serafina Mitchell, MD;  Location: [facility];  Service: Vascular;  Laterality: Right;  . FRACTURE SURGERY    . HERNIA REPAIR    . INCISION AND DRAINAGE ABSCESS     "groin"  . INSERTION OF DIALYSIS CATHETER Left 04/16/2017   Procedure: INSERTION of temporay DIALYSIS CATHETER, left femoral artery;  Surgeon: Serafina Mitchell, MD;  Location: [facility];  Service: Vascular;  Laterality: Left;  . IR HYBRID TRAUMA EMBOLIZATION  04/16/2017  . IR PARACENTESIS  06/12/2017  . IR PARACENTESIS  06/24/2017  . IR PARACENTESIS  07/01/2017  . IR PARACENTESIS  07/10/2017  . IR PARACENTESIS  08/29/2017  . ORIF ANKLE FRACTURE Right 02/16/2015   Procedure: OPEN REDUCTION INTERNAL FIXATION (ORIF) RIGHT ANKLE FRACTURE;  Surgeon: Altamese Har[place]erry, MD;  Location: [facility];  Service: Orthopedics;  Laterality: Right;  . PATCH ANGIOPLASTY Right 04/16/2017   Procedure: PATCH ANGIOPLASTY OF RIGHT FEMORAL ARTERY USING BOVINE PERICARDIUM PATCH;  Surgeon: Serafina Mitchell, MD;  [facility];  Service: Vascular;  Laterality: Right;  . UMBILICAL HERNIA REPAIR  X 2  . WOUND EXPLORATION Right 05/09/2017   Procedure: EXPLORATION RIGHT GROIN;  Surgeon: Serafina Mitchell, MD;  [facility];  Service: Vascular;  Laterality: Right;   Past Medical History:  Diagnosis Date  . Arthritis   . CHF (congestive heart failure) ([place])   . Chronic  anemia    2nd to renal disease  . CIN III (cervical intraepithelial neoplasia grade III) with severe dysplasia    S/P LEEP AND CONE  . Coronary artery disease    Status post cardiac catheterization June 2012 scattered coronary artery disease/atherosclerosis with 70-80% stenosis in a small right PDA.  . Diabetic Charcot's joint disease ([place])   . DM (diabetes mellitus) ([place])    Long-term insulin  . End stage renal disease on dialysis [facility]) 05/02/11   "[facility]"; [facility]; M; W, F ("got it 04/15/2017  because of Thanksgiving")  . Fracture of 5th metatarsal 2016   Right  . Gastroparesis   . Heart murmur   . 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 History of abscesses in groins 12/06/2010  . Hyperlipidemia    Hypertriglyceridemia 449 HDL 25  . Hypertension   . Migraines    "just on dialysis days"  . Peripheral neuropathy    related to DM  . Peripheral vascular disease ([place])    Tibial occlusive disease evaluated by Dr. Kellie Simmering in August 2011. Medical therapy  . Renal insufficiency    Dialysis since 2012  . Seizures ([place])    "only when her sugar drops" (04/17/2017)  . Tobacco use disorder    Discontinued March 2012  . Trimalleolar fracture of ankle, closed 02/09/2015   Right   BP (!) 184/90   Pulse 76   Ht 6\' 4"  (1.93 m) Comment: pt reported  Wt 170 lb (77.1 kg) Comment: pt reportred  LMP 10/09/2015 Comment: pt on dialysis  SpO2 94%   BMI 20.69 kg/m   Opioid Risk Score:   Fall Risk Score:  `1  Depression screen PHQ 2/9  Depression screen [facility] 2/9 09/11/2017 07/31/2017 07/22/2017 07/15/2017 06/15/2017 03/04/2015  Decreased Interest 0 2 0 0 0 0  Down, Depressed, Hopeless 0 2 0 1 0 0  PHQ - 2 Score 0 4 0 1 0 0  Altered sleeping 0 2 - - - -  Tired, decreased energy 0 1 - - - -  Change in appetite 0 0 - - - -  Feeling bad or failure about yourself  0 2 - - - -  Trouble concentrating 0 1 - - - -  Moving slowly or fidgety/restless 0 0 - - - -  Suicidal thoughts 0 0 - - - -  PHQ-9 Score 0 10 - - -  -  Difficult doing work/chores Not difficult at all Not difficult at all - - - -  Some recent data might be hidden        Review of Systems  HENT: Negative.   Eyes: Negative.   Respiratory: Negative.   Cardiovascular: Negative.   Gastrointestinal: Negative.   Endocrine: Negative.   Genitourinary: Negative.   Musculoskeletal: Positive for gait problem.  Skin: Negative.   Allergic/Immunologic: Negative.   Neurological: Positive for weakness.  Hematological: Bruises/bleeds easily.  Psychiatric/Behavioral: The patient is nervous/anxious.   All other systems reviewed and are negative.     Objective:   Physical Exam Constitutional: She has a sickly appearance. No distress.  HENT: Normocephalic and atraumatic.  Eyes:  and EOM are normal. No discharge.  Cardiovascular: RRR. No JVD. Respiratory: Effort normal and breath sounds normal.  GI: Bowel sounds are normal. +Distended. Ascites, firm.  Musculoskeletal: She exhibits mild edema.  Neurological: She is alert and oriented.  Motor: 4-4+/5 B/l UE LLE HF 4-/5, KE 4-/5, ADF 4/5 RLE HF 4/5 (improving) Skin: Skin is warm and dry. She is not diaphoretic.  Ischial Ulcer, not examined  Right BKA with lateral serosanguinous drainage Psychiatric: Her speech is normal. Blunt.    Assessment & Plan:  42 y.o. female with histoyr of CAD, ESRD, T2DM, right foot wounds s/p revascularization complicated hospital course 03/2017, sacral decub and RLE ischemia requiring right metatarsal amputation with exploration of right groin wound 04/2017 presents for follow up for right BKA as well as multiple other medical issues.   1. Deficits with mobility, transfers, self-care secondary to Right BKA and wounds.   Cont therapies,  pt to resume, needs to follow up  Cont follow up with Vascular, cont dressing changes, not ready for prosthesis  2. Left Ischium decub with osteo  Cont dressing changes  Cont follow up with wound care  Surgery planned for 5/9  per pt  3. T2DM: Monitor BS ac/hs.   Cont meds  Remains relatively elevated, encouraged follow up with PCP  4. HTN:   Remains uncontrolled with hypertensive crisis today, pt states better at home  Encouraged follow up with Nephrology  5. Ascites  Monitor weights  Decision to tap per Nephro, cont periodic paracentesis  6. ?Brain mass  Needs follow up with Neurosurg, pt states she will follow up later, told it was not urgent

## 2017-09-12 DIAGNOSIS — N2581 Secondary hyperparathyroidism of renal origin: Secondary | ICD-10-CM | POA: Diagnosis not present

## 2017-09-12 DIAGNOSIS — N186 End stage renal disease: Secondary | ICD-10-CM | POA: Diagnosis not present

## 2017-09-12 DIAGNOSIS — D649 Anemia, unspecified: Secondary | ICD-10-CM | POA: Diagnosis not present

## 2017-09-15 ENCOUNTER — Other Ambulatory Visit (HOSPITAL_COMMUNITY): Payer: Self-pay | Admitting: Nephrology

## 2017-09-15 DIAGNOSIS — R188 Other ascites: Secondary | ICD-10-CM

## 2017-09-15 DIAGNOSIS — D649 Anemia, unspecified: Secondary | ICD-10-CM | POA: Diagnosis not present

## 2017-09-15 DIAGNOSIS — N2581 Secondary hyperparathyroidism of renal origin: Secondary | ICD-10-CM | POA: Diagnosis not present

## 2017-09-15 DIAGNOSIS — N186 End stage renal disease: Secondary | ICD-10-CM | POA: Diagnosis not present

## 2017-09-17 DIAGNOSIS — N2581 Secondary hyperparathyroidism of renal origin: Secondary | ICD-10-CM | POA: Diagnosis not present

## 2017-09-17 DIAGNOSIS — N186 End stage renal disease: Secondary | ICD-10-CM | POA: Diagnosis not present

## 2017-09-17 DIAGNOSIS — D649 Anemia, unspecified: Secondary | ICD-10-CM | POA: Diagnosis not present

## 2017-09-18 ENCOUNTER — Ambulatory Visit (HOSPITAL_COMMUNITY)
Admission: RE | Admit: 2017-09-18 | Discharge: 2017-09-18 | Disposition: A | Discharge status: Home / Self Care | Payer: Medicare Other | Source: Ambulatory Visit | Attending: Nephrology | Admitting: Nephrology

## 2017-09-18 ENCOUNTER — Encounter (HOSPITAL_COMMUNITY): Payer: Self-pay | Admitting: Interventional Radiology

## 2017-09-18 DIAGNOSIS — R188 Other ascites: Secondary | ICD-10-CM | POA: Diagnosis not present

## 2017-09-18 HISTORY — PX: IR PARACENTESIS: IMG2679

## 2017-09-18 MED ORDER — LIDOCAINE HCL (PF) 2 % IJ SOLN
INTRAMUSCULAR | Status: AC
  Filled 2017-09-18: qty 20

## 2017-09-18 MED ORDER — LIDOCAINE HCL (PF) 1 % IJ SOLN
INTRAMUSCULAR | Status: DC | PRN
  Administered 2017-09-18: 10 mL

## 2017-09-18 NOTE — Procedures (Signed)
PROCEDURE SUMMARY:  Successful US guided paracentesis from RLQ.  Yielded 12.6 L of dark amber/blood tinged fluid.  No immediate complications.  Pt tolerated well.   Specimen was sent for labs.  Ascencion Dike PA-C 09/18/2017 10:35 AM

## 2017-09-19 DIAGNOSIS — N2581 Secondary hyperparathyroidism of renal origin: Secondary | ICD-10-CM | POA: Diagnosis not present

## 2017-09-19 DIAGNOSIS — D649 Anemia, unspecified: Secondary | ICD-10-CM | POA: Diagnosis not present

## 2017-09-19 DIAGNOSIS — N186 End stage renal disease: Secondary | ICD-10-CM | POA: Diagnosis not present

## 2017-09-21 ENCOUNTER — Other Ambulatory Visit: Payer: Self-pay

## 2017-09-21 ENCOUNTER — Encounter (HOSPITAL_COMMUNITY): Payer: Self-pay | Admitting: Emergency Medicine

## 2017-09-21 DIAGNOSIS — Z87891 Personal history of nicotine dependence: Secondary | ICD-10-CM

## 2017-09-21 DIAGNOSIS — G8929 Other chronic pain: Secondary | ICD-10-CM | POA: Diagnosis present

## 2017-09-21 DIAGNOSIS — I132 Hypertensive heart and chronic kidney disease with heart failure and with stage 5 chronic kidney disease, or end stage renal disease: Secondary | ICD-10-CM | POA: Diagnosis not present

## 2017-09-21 DIAGNOSIS — Z794 Long term (current) use of insulin: Secondary | ICD-10-CM

## 2017-09-21 DIAGNOSIS — E875 Hyperkalemia: Secondary | ICD-10-CM | POA: Diagnosis present

## 2017-09-21 DIAGNOSIS — D631 Anemia in chronic kidney disease: Secondary | ICD-10-CM | POA: Diagnosis present

## 2017-09-21 DIAGNOSIS — L03317 Cellulitis of buttock: Principal | ICD-10-CM | POA: Diagnosis present

## 2017-09-21 DIAGNOSIS — E781 Pure hyperglyceridemia: Secondary | ICD-10-CM | POA: Diagnosis present

## 2017-09-21 DIAGNOSIS — E11621 Type 2 diabetes mellitus with foot ulcer: Secondary | ICD-10-CM | POA: Diagnosis present

## 2017-09-21 DIAGNOSIS — R188 Other ascites: Secondary | ICD-10-CM | POA: Diagnosis present

## 2017-09-21 DIAGNOSIS — Z833 Family history of diabetes mellitus: Secondary | ICD-10-CM

## 2017-09-21 DIAGNOSIS — R229 Localized swelling, mass and lump, unspecified: Secondary | ICD-10-CM | POA: Diagnosis not present

## 2017-09-21 DIAGNOSIS — Z89511 Acquired absence of right leg below knee: Secondary | ICD-10-CM

## 2017-09-21 DIAGNOSIS — E1110 Type 2 diabetes mellitus with ketoacidosis without coma: Secondary | ICD-10-CM | POA: Diagnosis present

## 2017-09-21 DIAGNOSIS — E1122 Type 2 diabetes mellitus with diabetic chronic kidney disease: Secondary | ICD-10-CM | POA: Diagnosis present

## 2017-09-21 DIAGNOSIS — I2510 Atherosclerotic heart disease of native coronary artery without angina pectoris: Secondary | ICD-10-CM | POA: Diagnosis present

## 2017-09-21 DIAGNOSIS — L89154 Pressure ulcer of sacral region, stage 4: Secondary | ICD-10-CM | POA: Diagnosis not present

## 2017-09-21 DIAGNOSIS — L0231 Cutaneous abscess of buttock: Secondary | ICD-10-CM | POA: Diagnosis present

## 2017-09-21 DIAGNOSIS — E1143 Type 2 diabetes mellitus with diabetic autonomic (poly)neuropathy: Secondary | ICD-10-CM | POA: Diagnosis present

## 2017-09-21 DIAGNOSIS — E1151 Type 2 diabetes mellitus with diabetic peripheral angiopathy without gangrene: Secondary | ICD-10-CM | POA: Diagnosis present

## 2017-09-21 DIAGNOSIS — Z992 Dependence on renal dialysis: Secondary | ICD-10-CM

## 2017-09-21 DIAGNOSIS — I5032 Chronic diastolic (congestive) heart failure: Secondary | ICD-10-CM | POA: Diagnosis not present

## 2017-09-21 DIAGNOSIS — L89159 Pressure ulcer of sacral region, unspecified stage: Secondary | ICD-10-CM | POA: Diagnosis not present

## 2017-09-21 DIAGNOSIS — L97429 Non-pressure chronic ulcer of left heel and midfoot with unspecified severity: Secondary | ICD-10-CM | POA: Diagnosis present

## 2017-09-21 DIAGNOSIS — N186 End stage renal disease: Secondary | ICD-10-CM | POA: Diagnosis not present

## 2017-09-21 DIAGNOSIS — E785 Hyperlipidemia, unspecified: Secondary | ICD-10-CM | POA: Diagnosis present

## 2017-09-21 DIAGNOSIS — K3184 Gastroparesis: Secondary | ICD-10-CM | POA: Diagnosis present

## 2017-09-21 DIAGNOSIS — F329 Major depressive disorder, single episode, unspecified: Secondary | ICD-10-CM | POA: Diagnosis present

## 2017-09-21 DIAGNOSIS — M79672 Pain in left foot: Secondary | ICD-10-CM | POA: Diagnosis not present

## 2017-09-21 DIAGNOSIS — Z86001 Personal history of in-situ neoplasm of cervix uteri: Secondary | ICD-10-CM

## 2017-09-21 DIAGNOSIS — Z8249 Family history of ischemic heart disease and other diseases of the circulatory system: Secondary | ICD-10-CM

## 2017-09-21 NOTE — ED Triage Notes (Signed)
Pt states she has a large blood blister on her left heel and also she has a large abscess on her left buttock  Pt states the abscess started about 5 days ago but has gotten worse and more painful in the past 2 days

## 2017-09-22 ENCOUNTER — Inpatient Hospital Stay (HOSPITAL_COMMUNITY)
Admission: EM | Admit: 2017-09-22 | Discharge: 2017-09-26 | DRG: 602 | Disposition: A | Discharge status: Home Health | Payer: Medicare Other | Attending: Internal Medicine | Admitting: Internal Medicine

## 2017-09-22 ENCOUNTER — Emergency Department (HOSPITAL_COMMUNITY): Payer: Medicare Other

## 2017-09-22 ENCOUNTER — Encounter (HOSPITAL_COMMUNITY): Payer: Self-pay

## 2017-09-22 DIAGNOSIS — Z992 Dependence on renal dialysis: Secondary | ICD-10-CM | POA: Diagnosis not present

## 2017-09-22 DIAGNOSIS — E1151 Type 2 diabetes mellitus with diabetic peripheral angiopathy without gangrene: Secondary | ICD-10-CM | POA: Diagnosis present

## 2017-09-22 DIAGNOSIS — E11621 Type 2 diabetes mellitus with foot ulcer: Secondary | ICD-10-CM | POA: Diagnosis present

## 2017-09-22 DIAGNOSIS — L03317 Cellulitis of buttock: Secondary | ICD-10-CM | POA: Diagnosis not present

## 2017-09-22 DIAGNOSIS — R188 Other ascites: Secondary | ICD-10-CM

## 2017-09-22 DIAGNOSIS — Z87891 Personal history of nicotine dependence: Secondary | ICD-10-CM | POA: Diagnosis not present

## 2017-09-22 DIAGNOSIS — L0390 Cellulitis, unspecified: Secondary | ICD-10-CM

## 2017-09-22 DIAGNOSIS — N186 End stage renal disease: Secondary | ICD-10-CM | POA: Diagnosis not present

## 2017-09-22 DIAGNOSIS — E781 Pure hyperglyceridemia: Secondary | ICD-10-CM | POA: Diagnosis present

## 2017-09-22 DIAGNOSIS — Z86001 Personal history of in-situ neoplasm of cervix uteri: Secondary | ICD-10-CM | POA: Diagnosis not present

## 2017-09-22 DIAGNOSIS — D638 Anemia in other chronic diseases classified elsewhere: Secondary | ICD-10-CM | POA: Diagnosis not present

## 2017-09-22 DIAGNOSIS — E1129 Type 2 diabetes mellitus with other diabetic kidney complication: Secondary | ICD-10-CM | POA: Diagnosis not present

## 2017-09-22 DIAGNOSIS — E1365 Other specified diabetes mellitus with hyperglycemia: Secondary | ICD-10-CM

## 2017-09-22 DIAGNOSIS — I120 Hypertensive chronic kidney disease with stage 5 chronic kidney disease or end stage renal disease: Secondary | ICD-10-CM | POA: Diagnosis not present

## 2017-09-22 DIAGNOSIS — L0231 Cutaneous abscess of buttock: Secondary | ICD-10-CM | POA: Diagnosis not present

## 2017-09-22 DIAGNOSIS — E1110 Type 2 diabetes mellitus with ketoacidosis without coma: Secondary | ICD-10-CM | POA: Diagnosis present

## 2017-09-22 DIAGNOSIS — Z794 Long term (current) use of insulin: Secondary | ICD-10-CM | POA: Diagnosis not present

## 2017-09-22 DIAGNOSIS — E1322 Other specified diabetes mellitus with diabetic chronic kidney disease: Secondary | ICD-10-CM

## 2017-09-22 DIAGNOSIS — IMO0002 Reserved for concepts with insufficient information to code with codable children: Secondary | ICD-10-CM | POA: Diagnosis present

## 2017-09-22 DIAGNOSIS — L89154 Pressure ulcer of sacral region, stage 4: Secondary | ICD-10-CM | POA: Diagnosis present

## 2017-09-22 DIAGNOSIS — E119 Type 2 diabetes mellitus without complications: Secondary | ICD-10-CM | POA: Diagnosis not present

## 2017-09-22 DIAGNOSIS — I5032 Chronic diastolic (congestive) heart failure: Secondary | ICD-10-CM | POA: Diagnosis present

## 2017-09-22 DIAGNOSIS — I2510 Atherosclerotic heart disease of native coronary artery without angina pectoris: Secondary | ICD-10-CM | POA: Diagnosis present

## 2017-09-22 DIAGNOSIS — E785 Hyperlipidemia, unspecified: Secondary | ICD-10-CM | POA: Diagnosis present

## 2017-09-22 DIAGNOSIS — I132 Hypertensive heart and chronic kidney disease with heart failure and with stage 5 chronic kidney disease, or end stage renal disease: Secondary | ICD-10-CM | POA: Diagnosis present

## 2017-09-22 DIAGNOSIS — E1122 Type 2 diabetes mellitus with diabetic chronic kidney disease: Secondary | ICD-10-CM | POA: Diagnosis present

## 2017-09-22 DIAGNOSIS — I10 Essential (primary) hypertension: Secondary | ICD-10-CM | POA: Diagnosis present

## 2017-09-22 DIAGNOSIS — R229 Localized swelling, mass and lump, unspecified: Secondary | ICD-10-CM | POA: Diagnosis not present

## 2017-09-22 DIAGNOSIS — Z89511 Acquired absence of right leg below knee: Secondary | ICD-10-CM | POA: Diagnosis not present

## 2017-09-22 DIAGNOSIS — L97429 Non-pressure chronic ulcer of left heel and midfoot with unspecified severity: Secondary | ICD-10-CM | POA: Diagnosis present

## 2017-09-22 DIAGNOSIS — E1143 Type 2 diabetes mellitus with diabetic autonomic (poly)neuropathy: Secondary | ICD-10-CM | POA: Diagnosis present

## 2017-09-22 DIAGNOSIS — D631 Anemia in chronic kidney disease: Secondary | ICD-10-CM | POA: Diagnosis present

## 2017-09-22 DIAGNOSIS — L89159 Pressure ulcer of sacral region, unspecified stage: Secondary | ICD-10-CM | POA: Diagnosis not present

## 2017-09-22 DIAGNOSIS — K3184 Gastroparesis: Secondary | ICD-10-CM | POA: Diagnosis present

## 2017-09-22 DIAGNOSIS — Z833 Family history of diabetes mellitus: Secondary | ICD-10-CM | POA: Diagnosis not present

## 2017-09-22 DIAGNOSIS — I739 Peripheral vascular disease, unspecified: Secondary | ICD-10-CM | POA: Diagnosis present

## 2017-09-22 DIAGNOSIS — M79672 Pain in left foot: Secondary | ICD-10-CM | POA: Diagnosis not present

## 2017-09-22 LAB — CBC
HEMATOCRIT: 29.3 % — AB (ref 36.0–46.0)
HEMOGLOBIN: 9.4 g/dL — AB (ref 12.0–15.0)
MCH: 28.5 pg (ref 26.0–34.0)
MCHC: 32.1 g/dL (ref 30.0–36.0)
MCV: 88.8 fL (ref 78.0–100.0)
Platelets: 293 10*3/uL (ref 150–400)
RBC: 3.3 MIL/uL — AB (ref 3.87–5.11)
RDW: 15.8 % — ABNORMAL HIGH (ref 11.5–15.5)
WBC: 10.5 10*3/uL (ref 4.0–10.5)

## 2017-09-22 LAB — BASIC METABOLIC PANEL
ANION GAP: 18 — AB (ref 5–15)
Anion gap: 22 — ABNORMAL HIGH (ref 5–15)
Anion gap: 18 — ABNORMAL HIGH (ref 5–15)
Anion gap: 19 — ABNORMAL HIGH (ref 5–15)
BUN: 70 mg/dL — ABNORMAL HIGH (ref 6–20)
BUN: 67 mg/dL — AB (ref 6–20)
BUN: 65 mg/dL — AB (ref 6–20)
BUN: 78 mg/dL — ABNORMAL HIGH (ref 6–20)
CALCIUM: 7 mg/dL — AB (ref 8.9–10.3)
CHLORIDE: 97 mmol/L — AB (ref 101–111)
CHLORIDE: 98 mmol/L — AB (ref 101–111)
CHLORIDE: 98 mmol/L — AB (ref 101–111)
CO2: 23 mmol/L (ref 22–32)
CO2: 21 mmol/L — ABNORMAL LOW (ref 22–32)
CO2: 20 mmol/L — ABNORMAL LOW (ref 22–32)
CO2: 20 mmol/L — ABNORMAL LOW (ref 22–32)
CREATININE: 7.55 mg/dL — AB (ref 0.44–1.00)
CREATININE: 6.9 mg/dL — AB (ref 0.44–1.00)
CREATININE: 8.69 mg/dL — AB (ref 0.44–1.00)
Calcium: 7.8 mg/dL — ABNORMAL LOW (ref 8.9–10.3)
Calcium: 7.7 mg/dL — ABNORMAL LOW (ref 8.9–10.3)
Calcium: 7.8 mg/dL — ABNORMAL LOW (ref 8.9–10.3)
Chloride: 103 mmol/L (ref 101–111)
Creatinine, Ser: 7.7 mg/dL — ABNORMAL HIGH (ref 0.44–1.00)
GFR calc Af Amer: 7 mL/min — ABNORMAL LOW (ref >60)
GFR calc Af Amer: 8 mL/min — ABNORMAL LOW (ref >60)
GFR calc non Af Amer: 6 mL/min — ABNORMAL LOW (ref >60)
GFR calc non Af Amer: 5 mL/min — ABNORMAL LOW (ref >60)
GFR, EST AFRICAN AMERICAN: 7 mL/min — AB (ref >60)
GFR, EST AFRICAN AMERICAN: 6 mL/min — AB (ref >60)
GFR, EST NON AFRICAN AMERICAN: 6 mL/min — AB (ref >60)
GFR, EST NON AFRICAN AMERICAN: 7 mL/min — AB (ref >60)
GLUCOSE: 83 mg/dL (ref 65–99)
Glucose, Bld: 456 mg/dL — ABNORMAL HIGH (ref 65–99)
Glucose, Bld: 178 mg/dL — ABNORMAL HIGH (ref 65–99)
Glucose, Bld: 180 mg/dL — ABNORMAL HIGH (ref 65–99)
POTASSIUM: 4.6 mmol/L (ref 3.5–5.1)
Potassium: 4.3 mmol/L (ref 3.5–5.1)
Potassium: 4.6 mmol/L (ref 3.5–5.1)
Potassium: 5.8 mmol/L — ABNORMAL HIGH (ref 3.5–5.1)
SODIUM: 141 mmol/L (ref 135–145)
SODIUM: 141 mmol/L (ref 135–145)
SODIUM: 137 mmol/L (ref 135–145)
Sodium: 138 mmol/L (ref 135–145)

## 2017-09-22 LAB — COMPREHENSIVE METABOLIC PANEL
ALT: 10 U/L — ABNORMAL LOW (ref 14–54)
AST: 16 U/L (ref 15–41)
Albumin: 1.8 g/dL — ABNORMAL LOW (ref 3.5–5.0)
Alkaline Phosphatase: 98 U/L (ref 38–126)
Anion gap: 19 — ABNORMAL HIGH (ref 5–15)
BUN: 68 mg/dL — ABNORMAL HIGH (ref 6–20)
CALCIUM: 7.9 mg/dL — AB (ref 8.9–10.3)
CHLORIDE: 95 mmol/L — AB (ref 101–111)
CO2: 21 mmol/L — AB (ref 22–32)
Creatinine, Ser: 7.5 mg/dL — ABNORMAL HIGH (ref 0.44–1.00)
GFR calc non Af Amer: 6 mL/min — ABNORMAL LOW (ref >60)
GFR, EST AFRICAN AMERICAN: 7 mL/min — AB (ref >60)
Glucose, Bld: 599 mg/dL (ref 65–99)
Potassium: 4.8 mmol/L (ref 3.5–5.1)
SODIUM: 135 mmol/L (ref 135–145)
Total Bilirubin: 0.3 mg/dL (ref 0.3–1.2)
Total Protein: 5.7 g/dL — ABNORMAL LOW (ref 6.5–8.1)

## 2017-09-22 LAB — CBG MONITORING, ED
GLUCOSE-CAPILLARY: 543 mg/dL — AB (ref 65–99)
GLUCOSE-CAPILLARY: 230 mg/dL — AB (ref 65–99)
GLUCOSE-CAPILLARY: 199 mg/dL — AB (ref 65–99)
GLUCOSE-CAPILLARY: 135 mg/dL — AB (ref 65–99)
GLUCOSE-CAPILLARY: 82 mg/dL (ref 65–99)
GLUCOSE-CAPILLARY: 72 mg/dL (ref 65–99)
GLUCOSE-CAPILLARY: 93 mg/dL (ref 65–99)
GLUCOSE-CAPILLARY: 186 mg/dL — AB (ref 65–99)
Glucose-Capillary: 503 mg/dL (ref 65–99)
Glucose-Capillary: 438 mg/dL — ABNORMAL HIGH (ref 65–99)
Glucose-Capillary: 344 mg/dL — ABNORMAL HIGH (ref 65–99)
Glucose-Capillary: 266 mg/dL — ABNORMAL HIGH (ref 65–99)
Glucose-Capillary: 115 mg/dL — ABNORMAL HIGH (ref 65–99)
Glucose-Capillary: 148 mg/dL — ABNORMAL HIGH (ref 65–99)
Glucose-Capillary: 155 mg/dL — ABNORMAL HIGH (ref 65–99)
Glucose-Capillary: 158 mg/dL — ABNORMAL HIGH (ref 65–99)

## 2017-09-22 LAB — HEMOGLOBIN A1C
Hgb A1c MFr Bld: 12 % — ABNORMAL HIGH (ref 4.8–5.6)
Mean Plasma Glucose: 297.7 mg/dL

## 2017-09-22 LAB — CBC WITH DIFFERENTIAL/PLATELET
Basophils Absolute: 0 10*3/uL (ref 0.0–0.1)
Basophils Relative: 0 %
EOS ABS: 0.3 10*3/uL (ref 0.0–0.7)
Eosinophils Relative: 2 %
HCT: 31.2 % — ABNORMAL LOW (ref 36.0–46.0)
Hemoglobin: 9.7 g/dL — ABNORMAL LOW (ref 12.0–15.0)
LYMPHS ABS: 1 10*3/uL (ref 0.7–4.0)
Lymphocytes Relative: 9 %
MCH: 28 pg (ref 26.0–34.0)
MCHC: 31.1 g/dL (ref 30.0–36.0)
MCV: 90.2 fL (ref 78.0–100.0)
MONOS PCT: 7 %
Monocytes Absolute: 0.9 10*3/uL (ref 0.1–1.0)
Neutro Abs: 9.7 10*3/uL — ABNORMAL HIGH (ref 1.7–7.7)
Neutrophils Relative %: 82 %
PLATELETS: 332 10*3/uL (ref 150–400)
RBC: 3.46 MIL/uL — ABNORMAL LOW (ref 3.87–5.11)
RDW: 15.9 % — ABNORMAL HIGH (ref 11.5–15.5)
WBC: 11.9 10*3/uL — AB (ref 4.0–10.5)

## 2017-09-22 LAB — I-STAT CHEM 8, ED
BUN: 58 mg/dL — ABNORMAL HIGH (ref 6–20)
CALCIUM ION: 0.97 mmol/L — AB (ref 1.15–1.40)
Chloride: 96 mmol/L — ABNORMAL LOW (ref 101–111)
Creatinine, Ser: 7.5 mg/dL — ABNORMAL HIGH (ref 0.44–1.00)
GLUCOSE: 583 mg/dL — AB (ref 65–99)
HCT: 30 % — ABNORMAL LOW (ref 36.0–46.0)
HEMOGLOBIN: 10.2 g/dL — AB (ref 12.0–15.0)
Potassium: 4.6 mmol/L (ref 3.5–5.1)
SODIUM: 133 mmol/L — AB (ref 135–145)
TCO2: 23 mmol/L (ref 22–32)

## 2017-09-22 LAB — HCG, SERUM, QUALITATIVE: PREG SERUM: NEGATIVE

## 2017-09-22 MED ORDER — SEVELAMER CARBONATE 800 MG PO TABS
2400.0000 mg | ORAL_TABLET | Freq: Three times a day (TID) | ORAL | Status: DC
  Administered 2017-09-22 – 2017-09-25 (×10): 2400 mg via ORAL
  Filled 2017-09-22 (×13): qty 3

## 2017-09-22 MED ORDER — AMLODIPINE BESYLATE 10 MG PO TABS
10.0000 mg | ORAL_TABLET | Freq: Every day | ORAL | Status: DC
  Administered 2017-09-23 – 2017-09-25 (×3): 10 mg via ORAL
  Filled 2017-09-22 (×4): qty 1

## 2017-09-22 MED ORDER — INSULIN ASPART 100 UNIT/ML ~~LOC~~ SOLN: 0.0000 [IU] | Freq: Every day | SUBCUTANEOUS | Status: DC

## 2017-09-22 MED ORDER — SODIUM CHLORIDE 0.9 % IV SOLN
INTRAVENOUS | Status: DC
  Administered 2017-09-22: 0.4 [IU]/h via INTRAVENOUS
  Filled 2017-09-22: qty 1

## 2017-09-22 MED ORDER — ONDANSETRON HCL 4 MG/2ML IJ SOLN: 4.0000 mg | Freq: Four times a day (QID) | INTRAMUSCULAR | Status: DC | PRN

## 2017-09-22 MED ORDER — LABETALOL HCL 300 MG PO TABS
300.0000 mg | ORAL_TABLET | Freq: Three times a day (TID) | ORAL | Status: DC
  Administered 2017-09-22 – 2017-09-26 (×12): 300 mg via ORAL
  Filled 2017-09-22 (×14): qty 1

## 2017-09-22 MED ORDER — ACETAMINOPHEN 325 MG PO TABS: 650.0000 mg | ORAL_TABLET | Freq: Four times a day (QID) | ORAL | Status: DC | PRN

## 2017-09-22 MED ORDER — INSULIN ASPART 100 UNIT/ML ~~LOC~~ SOLN
0.0000 [IU] | Freq: Every day | SUBCUTANEOUS | Status: DC
  Administered 2017-09-23 – 2017-09-25 (×2): 4 [IU] via SUBCUTANEOUS

## 2017-09-22 MED ORDER — SODIUM CHLORIDE 0.9 % IV SOLN
INTRAVENOUS | Status: DC
  Administered 2017-09-22: 4.8 [IU]/h via INTRAVENOUS
  Filled 2017-09-22: qty 1

## 2017-09-22 MED ORDER — INSULIN GLARGINE 100 UNIT/ML ~~LOC~~ SOLN: 10.0000 [IU] | Freq: Every day | SUBCUTANEOUS | Status: DC

## 2017-09-22 MED ORDER — INSULIN REGULAR BOLUS VIA INFUSION
0.0000 [IU] | Freq: Three times a day (TID) | INTRAVENOUS | Status: DC
  Filled 2017-09-22: qty 10

## 2017-09-22 MED ORDER — CINACALCET HCL 30 MG PO TABS
60.0000 mg | ORAL_TABLET | Freq: Every day | ORAL | Status: DC
  Administered 2017-09-22 – 2017-09-25 (×4): 60 mg via ORAL
  Filled 2017-09-22 (×5): qty 2

## 2017-09-22 MED ORDER — INSULIN GLARGINE 100 UNIT/ML ~~LOC~~ SOLN
10.0000 [IU] | Freq: Every day | SUBCUTANEOUS | Status: DC
  Administered 2017-09-23: 10 [IU] via SUBCUTANEOUS
  Filled 2017-09-22: qty 0.1

## 2017-09-22 MED ORDER — ACETAMINOPHEN 650 MG RE SUPP: 650.0000 mg | Freq: Four times a day (QID) | RECTAL | Status: DC | PRN

## 2017-09-22 MED ORDER — INSULIN ASPART 100 UNIT/ML ~~LOC~~ SOLN
0.0000 [IU] | Freq: Three times a day (TID) | SUBCUTANEOUS | Status: DC
  Administered 2017-09-23: 7 [IU] via SUBCUTANEOUS

## 2017-09-22 MED ORDER — SODIUM CHLORIDE 0.45 % IV SOLN: INTRAVENOUS | Status: DC

## 2017-09-22 MED ORDER — RENA-VITE PO TABS
1.0000 | ORAL_TABLET | Freq: Every day | ORAL | Status: DC
  Administered 2017-09-22 – 2017-09-25 (×4): 1 via ORAL
  Filled 2017-09-22 (×5): qty 1

## 2017-09-22 MED ORDER — METOCLOPRAMIDE HCL 5 MG PO TABS
2.5000 mg | ORAL_TABLET | Freq: Three times a day (TID) | ORAL | Status: DC
  Administered 2017-09-22 – 2017-09-26 (×12): 2.5 mg via ORAL
  Filled 2017-09-22 (×3): qty 1
  Filled 2017-09-22: qty 0.5
  Filled 2017-09-22 (×2): qty 1
  Filled 2017-09-22 (×2): qty 0.5
  Filled 2017-09-22 (×4): qty 1
  Filled 2017-09-22: qty 0.5

## 2017-09-22 MED ORDER — VANCOMYCIN HCL IN DEXTROSE 1-5 GM/200ML-% IV SOLN: 1000.0000 mg | Freq: Once | INTRAVENOUS | Status: DC

## 2017-09-22 MED ORDER — IOPAMIDOL (ISOVUE-300) INJECTION 61%
100.0000 mL | Freq: Once | INTRAVENOUS | Status: AC | PRN
  Administered 2017-09-22: 80 mL via INTRAVENOUS

## 2017-09-22 MED ORDER — DEXTROSE 50 % IV SOLN: 25.0000 mL | INTRAVENOUS | Status: DC | PRN

## 2017-09-22 MED ORDER — SODIUM CHLORIDE 0.9 % IV SOLN
INTRAVENOUS | Status: DC
  Administered 2017-09-22: 06:00:00 via INTRAVENOUS

## 2017-09-22 MED ORDER — ONDANSETRON HCL 4 MG PO TABS
4.0000 mg | ORAL_TABLET | Freq: Four times a day (QID) | ORAL | Status: DC | PRN
Start: 2017-09-22 — End: 2017-09-26

## 2017-09-22 MED ORDER — DEXTROSE-NACL 5-0.45 % IV SOLN
INTRAVENOUS | Status: DC
  Administered 2017-09-22: 10:00:00 via INTRAVENOUS

## 2017-09-22 MED ORDER — HYDRALAZINE HCL 20 MG/ML IJ SOLN
10.0000 mg | INTRAMUSCULAR | Status: DC | PRN
  Administered 2017-09-22: 10 mg via INTRAVENOUS
  Filled 2017-09-22: qty 1

## 2017-09-22 MED ORDER — GABAPENTIN 100 MG PO CAPS
100.0000 mg | ORAL_CAPSULE | Freq: Two times a day (BID) | ORAL | Status: DC
  Administered 2017-09-22 – 2017-09-26 (×9): 100 mg via ORAL
  Filled 2017-09-22 (×9): qty 1

## 2017-09-22 MED ORDER — PIPERACILLIN-TAZOBACTAM 3.375 G IVPB
3.3750 g | Freq: Two times a day (BID) | INTRAVENOUS | Status: DC
  Administered 2017-09-22 – 2017-09-25 (×7): 3.375 g via INTRAVENOUS
  Filled 2017-09-22 (×7): qty 50

## 2017-09-22 MED ORDER — INSULIN ASPART 100 UNIT/ML ~~LOC~~ SOLN: 0.0000 [IU] | Freq: Three times a day (TID) | SUBCUTANEOUS | Status: DC

## 2017-09-22 MED ORDER — VANCOMYCIN HCL 10 G IV SOLR
1500.0000 mg | Freq: Once | INTRAVENOUS | Status: AC
  Administered 2017-09-22: 1500 mg via INTRAVENOUS
  Filled 2017-09-22: qty 1500

## 2017-09-22 MED ORDER — ISOSORBIDE MONONITRATE ER 60 MG PO TB24
120.0000 mg | ORAL_TABLET | ORAL | Status: DC
  Administered 2017-09-23 – 2017-09-25 (×2): 120 mg via ORAL
  Filled 2017-09-22 (×2): qty 2

## 2017-09-22 MED ORDER — ATORVASTATIN CALCIUM 40 MG PO TABS
40.0000 mg | ORAL_TABLET | Freq: Every day | ORAL | Status: DC
  Administered 2017-09-22 – 2017-09-25 (×4): 40 mg via ORAL
  Filled 2017-09-22 (×4): qty 1

## 2017-09-22 MED ORDER — FERRIC CITRATE 1 GM 210 MG(FE) PO TABS
420.0000 mg | ORAL_TABLET | Freq: Three times a day (TID) | ORAL | Status: DC
  Administered 2017-09-22: 420 mg via ORAL
  Administered 2017-09-22: 210 mg via ORAL
  Administered 2017-09-22 – 2017-09-25 (×9): 420 mg via ORAL
  Filled 2017-09-22 (×15): qty 2

## 2017-09-22 MED ORDER — PIPERACILLIN-TAZOBACTAM 3.375 G IVPB 30 MIN
3.3750 g | Freq: Once | INTRAVENOUS | Status: AC
  Administered 2017-09-22: 3.375 g via INTRAVENOUS
  Filled 2017-09-22: qty 50

## 2017-09-22 MED ORDER — HYDRALAZINE HCL 50 MG PO TABS
100.0000 mg | ORAL_TABLET | Freq: Three times a day (TID) | ORAL | Status: DC
  Administered 2017-09-23 – 2017-09-25 (×8): 100 mg via ORAL
  Filled 2017-09-22 (×12): qty 2

## 2017-09-22 MED ORDER — DEXTROSE-NACL 5-0.45 % IV SOLN: INTRAVENOUS | Status: DC

## 2017-09-22 MED ORDER — LORAZEPAM 0.5 MG PO TABS
0.5000 mg | ORAL_TABLET | Freq: Two times a day (BID) | ORAL | Status: DC | PRN
  Administered 2017-09-23 – 2017-09-25 (×4): 0.5 mg via ORAL
  Filled 2017-09-22 (×4): qty 1

## 2017-09-22 MED ORDER — SODIUM CHLORIDE 0.9 % IV SOLN
INTRAVENOUS | Status: DC
  Filled 2017-09-22: qty 1

## 2017-09-22 MED ORDER — IOPAMIDOL (ISOVUE-300) INJECTION 61%
INTRAVENOUS | Status: AC
  Administered 2017-09-22: 80 mL via INTRAVENOUS
  Filled 2017-09-22: qty 100

## 2017-09-22 MED ORDER — PANTOPRAZOLE SODIUM 40 MG PO TBEC
40.0000 mg | DELAYED_RELEASE_TABLET | Freq: Every day | ORAL | Status: DC
  Administered 2017-09-22 – 2017-09-26 (×5): 40 mg via ORAL
  Filled 2017-09-22 (×5): qty 1

## 2017-09-22 MED ORDER — INSULIN GLARGINE 100 UNIT/ML ~~LOC~~ SOLN
10.0000 [IU] | Freq: Once | SUBCUTANEOUS | Status: AC
  Administered 2017-09-22: 10 [IU] via SUBCUTANEOUS
  Filled 2017-09-22: qty 0.1

## 2017-09-22 MED ORDER — SERTRALINE HCL 50 MG PO TABS
50.0000 mg | ORAL_TABLET | Freq: Every day | ORAL | Status: DC
  Administered 2017-09-22 – 2017-09-26 (×5): 50 mg via ORAL
  Filled 2017-09-22 (×5): qty 1

## 2017-09-22 MED ORDER — CLONIDINE HCL 0.3 MG PO TABS
0.3000 mg | ORAL_TABLET | Freq: Three times a day (TID) | ORAL | Status: DC
  Administered 2017-09-22 – 2017-09-26 (×13): 0.3 mg via ORAL
  Filled 2017-09-22 (×2): qty 1
  Filled 2017-09-22 (×2): qty 3
  Filled 2017-09-22 (×6): qty 1
  Filled 2017-09-22: qty 3
  Filled 2017-09-22 (×3): qty 1

## 2017-09-22 NOTE — ED Notes (Signed)
Patient refusing medications until food tray gets here. Spoke with Karleen Hampshire, MD to get diet changed from liquid to renal. Waiting on new food tray.

## 2017-09-22 NOTE — ED Notes (Signed)
Patient transported to CT 

## 2017-09-22 NOTE — H&P (Signed)
History and Physical    [name] GEX:[id number] DOB: 01/15/1976 DOA: 09/22/2017  PCP: Merrilee Seashore, MD  Patient coming from: Home.  Chief Complaint: Left buttock abscess.  HPI: [name] is a 42 y.o. female with history of ESRD on hemodialysis Monday Wednesday Friday, CHF with ascites, diabetes mellitus type 2, anemia, hypertension who has had a cardiac arrest in November 2018 during vascular procedure was brought to the ER for worsening pain and swelling in the left buttock area.  Patient has sacral decubitus ulcer and pyogenic granuloma at the same time.  Patient noticed that patient has been having worsening swelling and pain on the left buttock area over the last 1 week and also has developed ulceration on the left heel.  Denies any fever chills.  Denies any chest pain nausea vomiting or diarrhea.  Has distended abdomen from ascites and is due for paracentesis.  Has not missed her dialysis.  Has not missed Lantus insulin.  ED Course: In the ER patient on exam mass left buttock area which is erythematous and mildly tender.  CT scan shows features concerning for phlegmon versus developing abscess around the left buttock area.  Patient's blood sugar was 599 with anion gap of 19.  Patient was started on insulin infusion.  Empiric antibiotic started.  And will need to be transferred to [facility] for dialysis and also surgical consult.  Review of Systems: As per HPI, rest all negative.   Past Medical History:  Diagnosis Date  . Arthritis   . CHF (congestive heart failure) ([place])   . Chronic anemia    2nd to renal disease  . CIN III (cervical intraepithelial neoplasia grade III) with severe dysplasia    S/P LEEP AND CONE  . Coronary artery disease    Status post cardiac catheterization June 2012 scattered coronary artery disease/atherosclerosis with 70-80% stenosis in a small right PDA.  . Diabetic Charcot's joint disease ([place])   . DM (diabetes mellitus) ([place])     Long-term insulin  . End stage renal disease on dialysis [facility]) 05/02/11   "[facility]"; [facility]; M; W, F ("got it 04/15/2017 because of Thanksgiving")  . Fracture of 5th metatarsal 2016   Right  . Gastroparesis   . Heart murmur   . 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 History of abscesses in groins 12/06/2010  . Hyperlipidemia    Hypertriglyceridemia 449 HDL 25  . Hypertension   . Migraines    "just on dialysis days"  . Peripheral neuropathy    related to DM  . Peripheral vascular disease ([place])    Tibial occlusive disease evaluated by Dr. Kellie Simmering in August 2011. Medical therapy  . Renal insufficiency    Dialysis since 2012  . Seizures ([place])    "only when her sugar drops" (04/17/2017)  . Tobacco use disorder    Discontinued March 2012  . Trimalleolar fracture of ankle, closed 02/09/2015   Right    Past Surgical History:  Procedure Laterality Date  . ABDOMINAL AORTOGRAM W/LOWER EXTREMITY [name] 03/27/2017   Procedure: ABDOMINAL AORTOGRAM W/LOWER EXTREMITY;  Surgeon: Conrad [place], MD;  Location: [place] [facility];  Service: Cardiovascular;  Laterality: [name];  . AMPUTATION Right 05/09/2017   Procedure: AMPUTATION TRANSMETATARSAL OF RIGHT FOOT;  Surgeon: Serafina Mitchell, MD;  Location: [place];  Service: Vascular;  Laterality: Right;  . AMPUTATION Right 06/17/2017   Procedure: AMPUTATION BELOW KNEE RIGHT;  Surgeon: Serafina Mitchell, MD;  Location: [place];  Service:  Vascular;  Laterality: Right;  . AV FISTULA PLACEMENT Left 04/2010   "lower arm"  . CARDIAC CATHETERIZATION [name] 02/10/2015   Procedure: Left Heart Cath and Coronary Angiography;  Surgeon: Dixie Dials, MD;  Location: [place] CV [facility];  Service: Cardiovascular;  Laterality: [name];  . CARDIAC CATHETERIZATION [name] 02/21/2016   Procedure: Right Heart Cath;  Surgeon: Jolaine Artist, MD;  Location: [place] [facility];  Service: Cardiovascular;  Laterality: [name];  . CATARACT EXTRACTION W/ INTRAOCULAR LENS  IMPLANT, BILATERAL Bilateral   .  CERVICAL BIOPSY  W/ LOOP ELECTRODE EXCISION     h/o  . CERVICAL CONE BIOPSY     h/o  . COLPOSCOPY    . DILATION AND CURETTAGE OF UTERUS  2009  . ENDARTERECTOMY FEMORAL Right 04/16/2017   Procedure: RIGHT FEMORAL ENDARTERECTOMY;  Surgeon: Serafina Mitchell, MD;  Location: [place];  Service: Vascular;  Laterality: Right;  . FRACTURE SURGERY    . HERNIA REPAIR    . INCISION AND DRAINAGE ABSCESS     "groin"  . INSERTION OF DIALYSIS CATHETER Left 04/16/2017   Procedure: INSERTION of temporay DIALYSIS CATHETER, left femoral artery;  Surgeon: Serafina Mitchell, MD;  Location: [place];  Service: Vascular;  Laterality: Left;  . IR HYBRID TRAUMA EMBOLIZATION  04/16/2017  . IR PARACENTESIS  06/12/2017  . IR PARACENTESIS  06/24/2017  . IR PARACENTESIS  07/01/2017  . IR PARACENTESIS  07/10/2017  . IR PARACENTESIS  08/29/2017  . IR PARACENTESIS  09/18/2017  . ORIF ANKLE FRACTURE Right 02/16/2015   Procedure: OPEN REDUCTION INTERNAL FIXATION (ORIF) RIGHT ANKLE FRACTURE;  Surgeon: Altamese Naguabo, MD;  Location: [place];  Service: Orthopedics;  Laterality: Right;  . PATCH ANGIOPLASTY Right 04/16/2017   Procedure: PATCH ANGIOPLASTY OF RIGHT FEMORAL ARTERY USING BOVINE PERICARDIUM PATCH;  Surgeon: Serafina Mitchell, MD;  Location: [facility];  Service: Vascular;  Laterality: Right;  . UMBILICAL HERNIA REPAIR  X 2  . WOUND EXPLORATION Right 05/09/2017   Procedure: [place];  Surgeon: Serafina Mitchell, MD;  Location: [facility] OR;  Service: Vascular;  Laterality: Right;     reports that she quit smoking about 6 years ago. Her smoking use included cigarettes. She has a 3.00 pack-year smoking history. She has never used smokeless tobacco. She reports that she does not drink alcohol or use drugs.  Allergies  Allergen Reactions  . Cephalexin Other (See Comments)    Reaction unknown- Childhood allergy Tolerated Ceftriaxone, ceftazidime, zosyn, augmentin in the past  . Sulfamethoxazole-Trimethoprim Other (See Comments)     Unknown reaction. Pt states that she was told by her mother that she had allergy to Bactrim as a child.    Family History  Problem Relation Age of Onset  . Diabetes Mother   . Hypertension Mother   . Diabetes Father   . Hyperlipidemia Father   . Hypertension Father   . Diabetes Sister   . Stroke Maternal Grandmother   . Diabetes Sister   . Breast cancer Maternal Aunt        Age 74's    Prior to Admission medications   Medication Sig Start Date End Date Taking? Authorizing Provider  acetaminophen (TYLENOL) 325 MG tablet Take 1-2 tablets (325-650 mg total) by mouth every 4 (four) hours as needed for mild pain. 07/10/17  Yes Love, Ivan Anchors, PA-C  amLODipine (NORVASC) 10 MG tablet Take 1 tablet (10 mg total) by mouth daily after supper. 07/10/17  Yes Love, Ivan Anchors, PA-C  atorvastatin (LIPITOR) 40 MG tablet Take 1 tablet (40 mg total) by mouth daily at 6 PM. 07/10/17  Yes Love, Ivan Anchors, PA-C  b complex-vitamin c-folic acid (NEPHRO-VITE) 0.8 MG TABS tablet Take 1 tablet by mouth daily.   Yes [provider]  cinacalcet (SENSIPAR) 30 MG tablet Take 2 tablets (60 mg total) by mouth daily with supper. 07/10/17  Yes Love, Ivan Anchors, PA-C  cloNIDine (CATAPRES) 0.3 MG tablet Take 1 tablet (0.3 mg total) by mouth 3 (three) times daily. 07/10/17  Yes Love, Ivan Anchors, PA-C  ferric citrate (AURYXIA) 1 GM 210 MG(Fe) tablet Take 2 tablets (420 mg total) by mouth 3 (three) times daily with meals. 07/10/17  Yes Love, Ivan Anchors, PA-C  gabapentin (NEURONTIN) 100 MG capsule Take 1 capsule (100 mg total) by mouth 2 (two) times daily. 07/10/17  Yes Love, Ivan Anchors, PA-C  hydrALAZINE (APRESOLINE) 100 MG tablet Take 1 tablet (100 mg total) by mouth every 8 (eight) hours. 07/10/17  Yes Love, Ivan Anchors, PA-C  Insulin Glargine (LANTUS SOLOSTAR) 100 UNIT/ML Solostar Pen Inject 10 Units into the skin daily at 10 pm. 07/10/17  Yes Love, Ivan Anchors, PA-C  isosorbide mononitrate (IMDUR) 120 MG 24 hr tablet Take 120 mg by  mouth daily. (does no take on MWF) 09/09/17  Yes [provider]  labetalol (NORMODYNE) 300 MG tablet Take 1 tablet (300 mg total) by mouth 3 (three) times daily. 07/10/17  Yes Love, Ivan Anchors, PA-C  LORazepam (ATIVAN) 0.5 MG tablet Take 1 tablet (0.5 mg total) by mouth every 12 (twelve) hours as needed for anxiety. 07/10/17  Yes Love, Ivan Anchors, PA-C  metoCLOPramide (REGLAN) 5 MG tablet Take 0.5 tablets (2.5 mg total) by mouth 3 (three) times daily before meals. 07/10/17  Yes Love, Ivan Anchors, PA-C  pantoprazole (PROTONIX) 40 MG tablet Take 1 tablet (40 mg total) by mouth daily. 07/10/17  Yes Love, Ivan Anchors, PA-C  sertraline (ZOLOFT) 50 MG tablet Take 1 tablet (50 mg total) by mouth daily. 07/10/17  Yes Love, Ivan Anchors, PA-C  sevelamer carbonate (RENVELA) 800 MG tablet Take 3 tablets (2,400 mg total) by mouth 3 (three) times daily with meals. Give 3 tablets to = 2400 mg TID 07/10/17  Yes Love, Ivan Anchors, PA-C  cefTAZidime 2 g in sodium chloride 0.9 % 100 mL Inject 2 g into the vein every Monday, Wednesday, and Friday with hemodialysis. Patient not taking: Reported on 09/22/2017 07/10/17   Love, Ivan Anchors, PA-C  isosorbide dinitrate (ISORDIL) 30 MG tablet Take 2 tablets (60 mg total) by mouth 2 (two) times daily. Give two 30 mg tablets to = 60 mg BID Patient not taking: Reported on 09/22/2017 07/10/17   Love, Ivan Anchors, PA-C  vancomycin (VANCOCIN) 1-5 GM/200ML-% SOLN Inject 200 mLs (1,000 mg total) into the vein every Monday, Wednesday, and Friday with hemodialysis. Patient not taking: Reported on 09/22/2017 07/11/17   Bary Leriche, PA-C    Physical Exam: Vitals:   09/21/17 2348 09/22/17 0230  BP: (!) 178/84 (!) 186/87  Pulse: 86 91  Resp: 18 18  Temp: 97.9 F (36.6 C)   TempSrc: Oral   SpO2: 100% 99%      Constitutional: Moderate built and nourished. Vitals:   09/21/17 2348 09/22/17 0230  BP: (!) 178/84 (!) 186/87  Pulse: 86 91  Resp: 18 18  Temp: 97.9 F (36.6 C)   TempSrc: Oral   SpO2:  100% 99%   Eyes: Anicteric no pallor. ENMT: No discharge  from the ears eyes nose or mouth. Neck: No mass palpated no neck rigidity but no JVD appreciated. Respiratory: No rhonchi or crepitations. Cardiovascular: S1-S2 heard no murmurs appreciated. Abdomen: Distended soft nontender bowel sounds present. Musculoskeletal: Right BKA.  Left heel has an ulceration. Skin: Left heel ulceration.  Left buttock has erythematous area.  Sacral decubitus.  Pain is heated. Neurologic: Alert awake oriented to time place and person. Psychiatric: Appears normal per normal affect.   Labs on Admission: I have personally reviewed following labs and imaging studies  CBC: Recent Labs  Lab 09/22/17 0127 09/22/17 0135  WBC 11.9*  --   NEUTROABS 9.7*  --   HGB 9.7* 10.2*  HCT 31.2* 30.0*  MCV 90.2  --   PLT 332  --    Basic Metabolic Panel: Recent Labs  Lab 09/22/17 0127 09/22/17 0135  NA 135 133*  K 4.8 4.6  CL 95* 96*  CO2 21*  --   GLUCOSE 599* 583*  BUN 68* 58*  CREATININE 7.50* 7.50*  CALCIUM 7.9*  --    GFR: Estimated Creatinine Clearance: 12 mL/min (A) (by C-G formula based on SCr of 7.5 mg/dL (H)). Liver Function Tests: Recent Labs  Lab 09/22/17 0127  AST 16  ALT 10*  ALKPHOS 98  BILITOT 0.3  PROT 5.7*  ALBUMIN 1.8*   No results for input(s): LIPASE, AMYLASE in the last 168 hours. No results for input(s): AMMONIA in the last 168 hours. Coagulation Profile: No results for input(s): INR, PROTIME in the last 168 hours. Cardiac Enzymes: No results for input(s): CKTOTAL, CKMB, CKMBINDEX, TROPONINI in the last 168 hours. BNP (last 3 results) No results for input(s): PROBNP in the last 8760 hours. HbA1C: No results for input(s): HGBA1C in the last 72 hours. CBG: Recent Labs  Lab 09/22/17 0303  GLUCAP 543*   Lipid Profile: No results for input(s): CHOL, HDL, LDLCALC, TRIG, CHOLHDL, LDLDIRECT in the last 72 hours. Thyroid Function Tests: No results for input(s): TSH,  T4TOTAL, FREET4, T3FREE, THYROIDAB in the last 72 hours. Anemia Panel: No results for input(s): VITAMINB12, FOLATE, FERRITIN, TIBC, IRON, RETICCTPCT in the last 72 hours. Urine analysis:    Component Value Date/Time   COLORURINE YELLOW 02/02/2017 1123   APPEARANCEUR CLOUDY (A) 02/02/2017 1123   LABSPEC 1.008 02/02/2017 1123   PHURINE 9.0 (H) 02/02/2017 1123   GLUCOSEU >=500 (A) 02/02/2017 1123   HGBUR NEGATIVE 02/02/2017 1123   BILIRUBINUR NEGATIVE 02/02/2017 1123   KETONESUR NEGATIVE 02/02/2017 1123   PROTEINUR >=300 (A) 02/02/2017 1123   UROBILINOGEN 0.2 02/08/2011 1812   NITRITE NEGATIVE 02/02/2017 1123   LEUKOCYTESUR NEGATIVE 02/02/2017 1123   Sepsis Labs: @LABRCNTIP (procalcitonin:4,lacticidven:4) )No results found for this or any previous visit (from the past 240 hour(s)).   Radiological Exams on Admission: Ct Pelvis W Contrast  Result Date: 09/22/2017 CLINICAL DATA:  Initial evaluation for pelvic abscess. EXAM: CT PELVIS WITH CONTRAST TECHNIQUE: Multidetector CT imaging of the pelvis was performed using the standard protocol following the bolus administration of intravenous contrast. CONTRAST:  64mL ISOVUE-300 IOPAMIDOL (ISOVUE-300) INJECTION 61% COMPARISON:  Prior CT from 06/11/2017. FINDINGS: Urinary Tract: No obvious hydroureter. Bladder largely decompressed. Diffuse circumferential bladder wall thickening likely related incomplete distension. Bowel: No acute abnormality seen about the visualized bowels. Bowels are somewhat centralized secondary to large volume ascites. Vascular/Lymphatic: Extensive atherosclerotic change seen throughout the visualized vasculature. No obvious vascular abnormality. Prominent lymph nodes noted within the bilateral inguinal regions, measuring up to 13 mm on the right and 17  mm on the left. No other discernible adenopathy. Reproductive: Extensive vascular calcifications about the uterus and ovaries. Otherwise unremarkable. Other: Large volume ascites  within the visualized abdomen and pelvis. No free intraperitoneal air. Musculoskeletal: Diffuse anasarca. There is a more focal area of soft tissue density within the subcutaneous fat at the inferior left gluteal region measuring approximately 3.5 x 4.1 x 2.6 cm, suspicious for phlegmon and/or early abscess (series 3, image 141). No other discrete collections identified. Sacral decubitus ulcer with the erosion of the underlying distal sacrum/coccyx noted. No loculated collection within this region. No other acute osseous abnormality. No worrisome lytic or blastic osseous lesions. Probable superimposed injection sites noted within the subcutaneous fat of the anterior abdomen. IMPRESSION: 1. 3.5 x 4.1 x 2.6 cm soft tissue lesion within the subcutaneous fat of the inferior left gluteal region, suspicious for phlegmon and/or early abscess. Correlation with physical exam recommended. 2. Sacral decubitus ulcer with erosive changes within the underlying sacrum/coccyx. 3. Large volume ascites with diffuse anasarca. 4. Prominent bilateral inguinal adenopathy as above, nonspecific, but may be reactive in nature. Electronically Signed   By: Jeannine Boga M.D.   On: 09/22/2017 03:32   Dg Foot Complete Left  Result Date: 09/22/2017 CLINICAL DATA:  Blood blister on the left heel.  Diabetic. EXAM: LEFT FOOT - COMPLETE 3+ VIEW COMPARISON:  03/24/2017 FINDINGS: Old healed fracture deformity of the distal left fifth metatarsal bone. No evidence of acute fracture or dislocation of the left foot. No focal bone lesion. No cortical erosion or sclerosis. Prominent vascular calcifications. No radiopaque soft tissue foreign bodies. IMPRESSION: No acute bony abnormalities. No evidence to suggest osteomyelitis. Vascular calcifications. Electronically Signed   By: Lucienne Capers M.D.   On: 09/22/2017 02:35      Assessment/Plan Principal Problem:   Cellulitis and abscess of buttock Active Problems:   Essential  hypertension, benign   End stage renal disease on dialysis [facility])   PAD (peripheral artery disease) ([place])   Uncontrolled diabetes mellitus with end-stage renal disease ([facility])   Stage IV pressure ulcer of sacral region ([place])   Anemia of chronic disease    1. Cellulitis of the left buttock with possible developing abscess with left heel ulceration and cellulitis -patient placed on empiric antibiotics.  Will need surgical consult once patient reaches [facility].  Follow cultures. 2. Uncontrolled diabetes mellitus type 2 with possible developing DKA -patient started on insulin infusion.  Closely follow CBGs and metabolic panel. 3. CHF last 2D echo done in November 2018 was showing grade 2 diastolic dysfunction.  Patient has ascites will need paracentesis. 4. ESRD on hemodialysis on Monday Wednesday and Friday -once patient is transferred to [facility] will need dialysis.  Not in respiratory distress at this time.  Labs do not show any hyperkalemia.  Follow metabolic panel and respiratory status. 5. Hypertension on amlodipine clonidine hydralazine Isordil and labetalol which will be continued.  PRN hydralazine for systolic more than 440. 6. Anemia secondary to ESRD.  Follow CBC. 7. Peripheral artery disease status post right BKA. 8. History of cardiac arrest in November 2018.   DVT prophylaxis: SCDs for now since patient may need surgery for possible left buttock abscess. Code Status: Full code. Family Communication: Patient's husband. Disposition Plan: To be determined. Consults called: None. Admission status: Inpatient.   Rise Patience MD [facility] Pager [phone].  If 7PM-7AM, please contact night-coverage [email or web address] Password [facility]  09/22/2017, 3:55 AM

## 2017-09-22 NOTE — ED Notes (Signed)
Pharmacy to verify hydralazine

## 2017-09-22 NOTE — ED Notes (Addendum)
Patient asking about dialysis. Hal Hope MD paged.   Update: call back from [place]. MD speaking with flow manager about how to expedite transfer for dialysis

## 2017-09-22 NOTE — ED Notes (Signed)
Sent pharmacy a message. Waiting for pharmacy to sent Lantus.

## 2017-09-22 NOTE — Consult Note (Signed)
Renal Service Consult Note [facility]  [name] 09/22/2017 Sol Blazing Requesting Physician:  Dr Karleen Hampshire  Reason for Consult: dialysis patient w foot infection HPI: The patient is a 42 y.o. 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 Gets hd on mwf schedule.  Has known severe htn and we try to keep her sbp over 120-140 while on dialysis in the past at least .  Asked to see for esrd.    Pt went to the beach this weekend for one day, has not missed HD but thinks she has "some fluid on". Has had some low bp's during this last week on dialysis into the 90's which is unusual for her, no fevers or chills. No avf issues.   ROS  denies CP  no joint pain   no HA  no blurry vision  no rash  no diarrhea  no nausea/ vomiting     Past Medical History  Past Medical History:  Diagnosis Date  . Arthritis   . CHF (congestive heart failure) ([place])   . Chronic anemia    2nd to renal disease  . CIN III (cervical intraepithelial neoplasia grade III) with severe dysplasia    S/P LEEP AND CONE  . Coronary artery disease    Status post cardiac catheterization June 2012 scattered coronary artery disease/atherosclerosis with 70-80% stenosis in a small right PDA.  . Diabetic Charcot's joint disease ([place])   . DM (diabetes mellitus) ([place])    Long-term insulin  . End stage renal disease on dialysis [facility]) 05/02/11   "[facility]"; [facility]; M; W, F ("got it 04/15/2017 because of Thanksgiving")  . Fracture of 5th metatarsal 2016   Right  . Gastroparesis   . Heart murmur   . 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 History of abscesses in groins 12/06/2010  . Hyperlipidemia    Hypertriglyceridemia 449 HDL 25  .  Hypertension   . Migraines    "just on dialysis days"  . Peripheral neuropathy    related to DM  . Peripheral vascular disease ([place])    Tibial occlusive disease evaluated by Dr. Kellie Simmering in August 2011. Medical therapy  . Renal insufficiency    Dialysis since 2012  . Seizures ([place])    "only when her sugar drops" (04/17/2017)  . Tobacco use disorder    Discontinued March 2012  . Trimalleolar fracture of ankle, closed 02/09/2015   Right   Past Surgical History  Past Surgical History:  Procedure Laterality Date  . ABDOMINAL AORTOGRAM W/LOWER EXTREMITY [name] 03/27/2017   Procedure: ABDOMINAL AORTOGRAM W/LOWER EXTREMITY;  Surgeon: Conrad Falkville, MD;  Location: [place] [facility];  Service: Cardiovascular;  Laterality: [name];  . AMPUTATION Right 05/09/2017   Procedure: AMPUTATION TRANSMETATARSAL OF RIGHT FOOT;  Surgeon: Serafina Mitchell, MD;  Location: [place];  Service: Vascular;  Laterality: Right;  . AMPUTATION Right 06/17/2017   Procedure: AMPUTATION BELOW KNEE RIGHT;  Surgeon: Serafina Mitchell, MD;  [facility];  Service: Vascular;  Laterality: Right;  . AV FISTULA PLACEMENT Left 04/2010   "lower arm"  . CARDIAC CATHETERIZATION [name] 02/10/2015   Procedure: Left Heart Cath and Coronary Angiography;  Surgeon: Dixie Dials, MD;  Location: [place] [facility];  Service: Cardiovascular;  Laterality: [name];  . CARDIAC CATHETERIZATION [name] 02/21/2016   Procedure: Right Heart Cath;  Surgeon: Jolaine Artist, MD;  Location: [place] [facility];  Service: Cardiovascular;  Laterality: [name];  . CATARACT EXTRACTION W/ INTRAOCULAR LENS  IMPLANT, BILATERAL Bilateral   . CERVICAL BIOPSY  W/ LOOP ELECTRODE EXCISION     h/o  . CERVICAL CONE BIOPSY     h/o  . COLPOSCOPY    . DILATION AND CURETTAGE OF UTERUS  2009  . ENDARTERECTOMY FEMORAL Right 04/16/2017   Procedure: RIGHT FEMORAL ENDARTERECTOMY;  Surgeon: Serafina Mitchell, MD;  Location: [place];  Service: Vascular;  Laterality: Right;  . FRACTURE SURGERY     . HERNIA REPAIR    . INCISION AND DRAINAGE ABSCESS     "groin"  . INSERTION OF DIALYSIS CATHETER Left 04/16/2017   Procedure: INSERTION of temporay DIALYSIS CATHETER, left femoral artery;  Surgeon: Serafina Mitchell, MD;  Location: [place];  Service: Vascular;  Laterality: Left;  . IR HYBRID TRAUMA EMBOLIZATION  04/16/2017  . IR PARACENTESIS  06/12/2017  . IR PARACENTESIS  06/24/2017  . IR PARACENTESIS  07/01/2017  . IR PARACENTESIS  07/10/2017  . IR PARACENTESIS  08/29/2017  . IR PARACENTESIS  09/18/2017  . ORIF ANKLE FRACTURE Right 02/16/2015   Procedure: OPEN REDUCTION INTERNAL FIXATION (ORIF) RIGHT ANKLE FRACTURE;  Surgeon: Altamese Holley, MD;  Location: [place];  Service: Orthopedics;  Laterality: Right;  . PATCH ANGIOPLASTY Right 04/16/2017   Procedure: PATCH ANGIOPLASTY OF RIGHT FEMORAL ARTERY USING BOVINE PERICARDIUM PATCH;  Surgeon: Serafina Mitchell, MD;  [facility];  Service: Vascular;  Laterality: Right;  . UMBILICAL HERNIA REPAIR  X 2  . WOUND EXPLORATION Right 05/09/2017   Procedure: EXPLORATION RIGHT GROIN;  Surgeon: Serafina Mitchell, MD;  [facility];  Service: Vascular;  Laterality: Right;   Family History  Family History  Problem Relation Age of Onset  . Diabetes Mother   . Hypertension Mother   . Diabetes Father   . Hyperlipidemia Father   . Hypertension Father   . Diabetes Sister   . Stroke Maternal Grandmother   . Diabetes Sister   . Breast cancer Maternal Aunt        Age 62's   Social History  reports that she quit smoking about 6 years ago. Her smoking use included cigarettes. She has a 3.00 pack-year smoking history. She has never used smokeless tobacco. She reports that she does not drink alcohol or use drugs. Allergies  Allergies  Allergen Reactions  . Cephalexin Other (See Comments)    Reaction unknown- Childhood allergy Tolerated Ceftriaxone, ceftazidime, zosyn, augmentin in the past  . Sulfamethoxazole-Trimethoprim Other (See Comments)    Unknown  reaction. Pt states that she was told by her mother that she had allergy to Bactrim as a child.   Home medications Prior to Admission medications   Medication Sig Start Date End Date Taking? Authorizing Provider  acetaminophen (TYLENOL) 325 MG tablet Take 1-2 tablets (325-650 mg total) by mouth every 4 (four) hours as needed for mild pain. 07/10/17  Yes Love, Ivan Anchors, PA-C  amLODipine (NORVASC) 10 MG tablet Take 1 tablet (10 mg total) by mouth daily after supper. 07/10/17  Yes Love, Ivan Anchors, PA-C  atorvastatin (LIPITOR) 40 MG tablet Take 1 tablet (40 mg total) by mouth daily at 6 PM. 07/10/17  Yes Love, Ivan Anchors, PA-C  b complex-vitamin c-folic acid (NEPHRO-VITE) 0.8 MG TABS tablet Take 1 tablet  by mouth daily.   Yes [provider]  cinacalcet (SENSIPAR) 30 MG tablet Take 2 tablets (60 mg total) by mouth daily with supper. 07/10/17  Yes Love, Ivan Anchors, PA-C  cloNIDine (CATAPRES) 0.3 MG tablet Take 1 tablet (0.3 mg total) by mouth 3 (three) times daily. 07/10/17  Yes Love, Ivan Anchors, PA-C  ferric citrate (AURYXIA) 1 GM 210 MG(Fe) tablet Take 2 tablets (420 mg total) by mouth 3 (three) times daily with meals. 07/10/17  Yes Love, Ivan Anchors, PA-C  gabapentin (NEURONTIN) 100 MG capsule Take 1 capsule (100 mg total) by mouth 2 (two) times daily. 07/10/17  Yes Love, Ivan Anchors, PA-C  hydrALAZINE (APRESOLINE) 100 MG tablet Take 1 tablet (100 mg total) by mouth every 8 (eight) hours. 07/10/17  Yes Love, Ivan Anchors, PA-C  Insulin Glargine (LANTUS SOLOSTAR) 100 UNIT/ML Solostar Pen Inject 10 Units into the skin daily at 10 pm. 07/10/17  Yes Love, Ivan Anchors, PA-C  isosorbide mononitrate (IMDUR) 120 MG 24 hr tablet Take 120 mg by mouth daily. (does no take on MWF) 09/09/17  Yes [provider]  labetalol (NORMODYNE) 300 MG tablet Take 1 tablet (300 mg total) by mouth 3 (three) times daily. 07/10/17  Yes Love, Ivan Anchors, PA-C  LORazepam (ATIVAN) 0.5 MG tablet Take 1 tablet (0.5 mg total) by mouth every 12  (twelve) hours as needed for anxiety. 07/10/17  Yes Love, Ivan Anchors, PA-C  metoCLOPramide (REGLAN) 5 MG tablet Take 0.5 tablets (2.5 mg total) by mouth 3 (three) times daily before meals. 07/10/17  Yes Love, Ivan Anchors, PA-C  pantoprazole (PROTONIX) 40 MG tablet Take 1 tablet (40 mg total) by mouth daily. 07/10/17  Yes Love, Ivan Anchors, PA-C  sertraline (ZOLOFT) 50 MG tablet Take 1 tablet (50 mg total) by mouth daily. 07/10/17  Yes Love, Ivan Anchors, PA-C  sevelamer carbonate (RENVELA) 800 MG tablet Take 3 tablets (2,400 mg total) by mouth 3 (three) times daily with meals. Give 3 tablets to = 2400 mg TID 07/10/17  Yes Love, Ivan Anchors, PA-C  cefTAZidime 2 g in sodium chloride 0.9 % 100 mL Inject 2 g into the vein every Monday, Wednesday, and Friday with hemodialysis. Patient not taking: Reported on 09/22/2017 07/10/17   Love, Ivan Anchors, PA-C  isosorbide dinitrate (ISORDIL) 30 MG tablet Take 2 tablets (60 mg total) by mouth 2 (two) times daily. Give two 30 mg tablets to = 60 mg BID Patient not taking: Reported on 09/22/2017 07/10/17   Love, Ivan Anchors, PA-C  vancomycin (VANCOCIN) 1-5 GM/200ML-% SOLN Inject 200 mLs (1,000 mg total) into the vein every Monday, Wednesday, and Friday with hemodialysis. Patient not taking: Reported on 09/22/2017 07/11/17   Bary Leriche, PA-C   Liver Function Tests Recent Labs  Lab 09/22/17 0127  AST 16  ALT 10*  ALKPHOS 98  BILITOT 0.3  PROT 5.7*  ALBUMIN 1.8*   No results for input(s): LIPASE, AMYLASE in the last 168 hours. CBC Recent Labs  Lab 09/22/17 0127 09/22/17 0135 09/22/17 0526  WBC 11.9*  --  10.5  NEUTROABS 9.7*  --   --   HGB 9.7* 10.2* 9.4*  HCT 31.2* 30.0* 29.3*  MCV 90.2  --  88.8  PLT 332  --  240   Basic Metabolic Panel Recent Labs  Lab 09/22/17 0127 09/22/17 0135 09/22/17 0526 09/22/17 1030 09/22/17 1418  NA 135 133* 138 141 141  K 4.8 4.6 4.3 4.6 4.6  CL 95* 96* 97* 98* 103  CO2 21*  --  23 21* 20*  GLUCOSE 599* 583* 456* 178* 83  BUN 68* 58*  70* 67* 65*  CREATININE 7.50* 7.50* 7.70* 7.55* 6.90*  CALCIUM 7.9*  --  7.8* 7.7* 7.0*   Iron/TIBC/Ferritin/ %Sat    Component Value Date/Time   IRON 26 (L) 06/23/2017 0708   TIBC 113 (L) 06/23/2017 0708   FERRITIN 1,050 (H) 06/23/2017 0708   IRONPCTSAT 23 06/23/2017 0708    Vitals:   09/22/17 1230 09/22/17 1300 09/22/17 1330 09/22/17 1400  BP: (!) 161/85 (!) 151/71 137/68 140/89  Pulse: 73 80 80 75  Resp: (!) 24 (!) 23 (!) 23 (!) 21  Temp:      TempSrc:      SpO2: 98% 94% 93% 96%   Exam Gen alert, not in distress, calm No rash, cyanosis or gangrene Sclera anicteric, throat clear No jvd or bruits Chest clear bilat to bases RRR no MRG Abd soft ntnd no mass, 2- 3+ ascites +bs GU defer MS no joint effusions or deformity, R BKA stump Ext no LE or UE edema, left foot and buttock wounds bandaged Neuro is alert, Ox 3 , nf Left forearm avf w bruit    Dialysis: mwf nw 4h 20min  82kg  2/2.5 bath  p2  Heparin none   Left forearm avf -hectorol 1 ug - mircera 100 ug every 2 wks - home bp rx> norvasc 10/clon 0.3 tid/ hydral 100 tid/ labetalol 300 tid    Impression: 1  Cellulitis/ early abscess left buttock and left heel - admitted , started on intravenous abx 2  esrd on hd mwf 3  uncont dm2 started on iv insulin protocol 4  Volume not grossly overloaded, chronic ascites, try to keep sbp > 100 on dialysis 5  Hypertension severe by hx on 4 bp meds, cont 6  Cad on statin/ nitrates 7  Chronic pain 8  Depression 9  Anemia ckd - get records, hb 9.4 here   Plan - [facility] or in am  Kelly Splinter MD [place] pager [phone]   09/22/2017, 3:14 PM

## 2017-09-22 NOTE — ED Provider Notes (Signed)
  Physical Exam  BP (!) 150/70   Pulse 82   Temp 98.8 F (37.1 C) (Oral)   Resp 15   Ht 6\' 4"  (1.93 m)   Wt 89 kg (196 lb 3.4 oz)   LMP 10/09/2015 Comment: pt on dialysis  SpO2 90%   BMI 23.88 kg/m   Physical Exam  ED Course/Procedures     Procedures  MDM   Assuming care of patient from Dr. Randal Buba.   Patient in the ED for abscess / infection to her foot. She has hx of PAD, DM, ESRD on HD <(m/w/f). Workup thus far shows hyperglycemia, mild anion gap on DKA protocol with insulin drip.  Concerning findings are as following abnormal labs. Important pending results are bed assignment.  According to Dr. Randal Buba, plan is to admit at [facility]. Medicine has already admitted.   Patient had no complains, no concerns from the nursing side. Will continue to monitor. She was on insulin drip.  Pt reassessed x 2 times. Labs showed improving glucose, but the anion gap persisted.  CRITICAL CARE Performed by: Shakendra Griffeth   Total critical care time: 32 minutes  Critical care time was exclusive of separately billable procedures and treating other patients.  Critical care was necessary to treat or prevent imminent or life-threatening deterioration.  Critical care was time spent personally by me on the following activities: development of treatment plan with patient and/or surrogate as well as nursing, discussions with consultants, evaluation of patient's response to treatment, examination of patient, obtaining history from patient or surrogate, ordering and performing treatments and interventions, ordering and review of laboratory studies, ordering and review of radiographic studies, pulse oximetry and re-evaluation of patient's condition.       Varney Biles, MD 09/23/17 [phone]

## 2017-09-22 NOTE — ED Notes (Signed)
Paged akula,MD about patient CBG and glucose stabilizer.

## 2017-09-22 NOTE — ED Notes (Signed)
[facility] here to transport pt 

## 2017-09-22 NOTE — Progress Notes (Signed)
A consult was received from an ED physician for vanc/zosyn per pharmacy dosing.  The patient's profile has been reviewed for ht/wt/allergies/indication/available labs.   ESRD pt. She has tolerated Rocephin, Ceftazidime, Zosyn and Augmentin PTA.  A one time order has been placed for vancomycin 1500 mg and zosyn 3.375 gm.   Further antibiotics/pharmacy consults should be ordered by admitting physician if indicated.                       Thank you,  Eudelia Bunch, Pharm.D. [phone] 09/22/2017 2:19 AM

## 2017-09-22 NOTE — Progress Notes (Addendum)
Pharmacy Antibiotic Note  [name] is a 42 y.o. female admitted on 09/22/2017 with abscess.  Pharmacy has been consulted for vancomycin and zosyn dosing.  She is ESRD on HD MWF 2/6 pre-HD vanc level 17 2/13 pre -HD vanc level = 11 mcg/ml on 750mg  qHD MWF  Plan: Vancomycin 1500 mg given at 0305, then vancomycin 1 gm qHD MWF based on levels during Feb 2019 admission - will not enter vanc dose until transferred to [facility] Zosyn 3.375 gm given at [place], then zosyn 3.375 gm IV q12 h 4 hr infusion F/u HD schedule, WBC, temp, culture data PreHD vancomycin levels as needed    Temp (24hrs), Avg:97.9 F (36.6 C), Min:97.9 F (36.6 C), Max:97.9 F (36.6 C)  Recent Labs  Lab 09/22/17 0127 09/22/17 0135  WBC 11.9*  --   CREATININE 7.50* 7.50*    Estimated Creatinine Clearance: 12 mL/min (A) (by C-G formula based on SCr of 7.5 mg/dL (H)).    Allergies  Allergen Reactions  . Cephalexin Other (See Comments)    Reaction unknown- Childhood allergy Tolerated Ceftriaxone, ceftazidime, zosyn, augmentin in the past  . Sulfamethoxazole-Trimethoprim Other (See Comments)    Unknown reaction. Pt states that she was told by her mother that she had allergy to Bactrim as a child.    Antimicrobials this admission: 4/29 vanc>> 4/29 zosyn>> Dose adjustments this admission:  Microbiology results: 4/29 BCx2>>  Thank you for allowing pharmacy to be a part of this patient's care. Eudelia Bunch, Pharm.D. [phone] 09/22/2017 4:08 AM

## 2017-09-22 NOTE — ED Notes (Signed)
ED TO INPATIENT HANDOFF REPORT  Name/Age/Gender [name] 42 y.o. female  Code Status    Code Status Orders  (From admission, onward)        Start     Ordered   09/22/17 0354  Full code  Continuous     09/22/17 0354    Code Status History    Date Active Date Inactive Code Status Order ID Comments User Context   06/23/2017 1646 07/10/2017 1607 Full Code [id number]  Bary Leriche, PA-C Inpatient   06/23/2017 1646 06/23/2017 1646 Full Code [id number]  Bary Leriche, PA-C Inpatient   06/11/2017 2221 06/23/2017 1624 Full Code [id number]  Rise Patience, MD ED   05/13/2017 0026 06/10/2017 1905 Full Code [id number]  Maudry Diego Inpatient   04/28/2017 2109 05/09/2017 1102 Full Code [id number]  Maudry Diego Inpatient   04/16/2017 1822 04/28/2017 2105 Full Code [id number]  Dagoberto Ligas, PA-C Inpatient   04/08/2017 1429 04/08/2017 2301 Full Code [id number]  Serafina Mitchell, MD Inpatient   03/24/2017 0401 03/29/2017 1532 Full Code [id number]  Vianne Bulls, MD ED   02/15/2016 0443 02/24/2016 1903 Full Code [id number]  Etta Quill, DO ED   02/10/2015 1124 02/18/2015 1731 Full Code [id number]  Dixie Dials, MD Inpatient   02/09/2015 2229 02/10/2015 1124 Full Code [id number]  Dixie Dials, MD Inpatient   10/31/2014 0101 11/01/2014 2039 Full Code [id number]  Ivor Costa, MD Inpatient   01/27/2012 0245 01/27/2012 2006 Full Code [id number]  Quintella Baton, MD ED   05/02/2011 1925 05/03/2011 2146 Full Code [id number]  Vilinda Blanks, RN Inpatient      Home/SNF/Other Home  Chief Complaint Abscess  Level of Care/Admitting Diagnosis ED Disposition    ED Disposition Condition [place]: [place] [[id number]]  Level of Care: Telemetry [5]  Diagnosis: DKA (diabetic ketoacidoses) [facility]) [[id number]]  Admitting Physician: Hosie Poisson [[id number]]  Attending Physician: Hosie Poisson [[id number]]  Estimated length of stay: 3 - 4 days  Certification:: I certify  this patient will need inpatient services for at least 2 midnights  PT Class (Do Not Modify): Inpatient [101]  PT Acc Code (Do Not Modify): Private [1]       Medical History Past Medical History:  Diagnosis Date  . Arthritis   . CHF (congestive heart failure) ([place])   . Chronic anemia    2nd to renal disease  . CIN III (cervical intraepithelial neoplasia grade III) with severe dysplasia    S/P LEEP AND [place]  . Coronary artery disease    Status post cardiac catheterization June 2012 scattered coronary artery disease/atherosclerosis with 70-80% stenosis in a small right PDA.  . Diabetic Charcot's joint disease ([place])   . DM (diabetes mellitus) ([place])    Long-term insulin  . End stage renal disease on dialysis [facility]) 05/02/11   "[facility]"; [facility]; M; W, F ("got it 04/15/2017 because of Thanksgiving")  . Fracture of 5th metatarsal 2016   Right  . Gastroparesis   . Heart murmur   . 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 History of abscesses in groins 12/06/2010  . Hyperlipidemia    Hypertriglyceridemia 449 HDL 25  . Hypertension   . Migraines    "just on dialysis days"  . Peripheral neuropathy    related to DM  . Peripheral vascular disease ([place])    Tibial occlusive disease evaluated by Dr. Kellie Simmering in August 2011. Medical therapy  . Renal insufficiency    Dialysis since  2012  . Seizures ([place])    "only when her sugar drops" (04/17/2017)  . Tobacco use disorder    Discontinued March 2012  . Trimalleolar fracture of ankle, closed 02/09/2015   Right    Allergies Allergies  Allergen Reactions  . Cephalexin Other (See Comments)    Reaction unknown- Childhood allergy Tolerated Ceftriaxone, ceftazidime, zosyn, augmentin in the past  . Sulfamethoxazole-Trimethoprim Other (See Comments)    Unknown reaction. Pt states that she was told by her mother that she had allergy to Bactrim as a child.    IV Location/Drains/Wounds Patient Lines/Drains/Airways Status   Active Line/Drains/Airways     Name:   Placement date:   Placement time:   Site:   Days:   Peripheral IV 09/22/17 Right Hand   09/22/17    0125    Hand   less than 1   Peripheral IV 09/22/17 Right Antecubital   09/22/17    0140    Antecubital   less than 1   Fistula / Graft Left Forearm   -    -    Forearm      Incision (Closed) 04/16/17 Groin Right   04/16/17    1214     159   Incision (Closed) 06/17/17 Leg Right   06/17/17    1251     97   Pressure Injury 04/22/17 Stage II -  Partial thickness loss of dermis presenting as a shallow open ulcer with a red, pink wound bed without slough. coccyx, left buttock is partial thickness wound which appears to be related to shear   04/22/17    0300     153   Pressure Injury 06/12/17 Stage IV - Full thickness tissue loss with exposed bone, tendon or muscle.   06/12/17    1015     102   Wound / Incision (Open or Dehisced) 06/12/17 Non-pressure wound Toe (Comment  which one) Left;Distal 3rd toe tip   06/12/17    1015    Toe (Comment  which one)   102          Labs/Imaging Results for orders placed or performed during the hospital encounter of 09/22/17 (from the past 48 hour(s))  CBC with Differential/Platelet     Status: Abnormal   Collection Time: 09/22/17  1:27 AM  Result Value Ref Range   WBC 11.9 (H) 4.0 - 10.5 K/uL   RBC 3.46 (L) 3.87 - 5.11 MIL/uL   Hemoglobin 9.7 (L) 12.0 - 15.0 g/dL   HCT 31.2 (L) 36.0 - 46.0 %   MCV 90.2 78.0 - 100.0 fL   MCH 28.0 26.0 - 34.0 pg   MCHC 31.1 30.0 - 36.0 g/dL   RDW 15.9 (H) 11.5 - 15.5 %   Platelets 332 150 - 400 K/uL   Neutrophils Relative % 82 %   Neutro Abs 9.7 (H) 1.7 - 7.7 K/uL   Lymphocytes Relative 9 %   Lymphs Abs 1.0 0.7 - 4.0 K/uL   Monocytes Relative 7 %   Monocytes Absolute 0.9 0.1 - 1.0 K/uL   Eosinophils Relative 2 %   Eosinophils Absolute 0.3 0.0 - 0.7 K/uL   Basophils Relative 0 %   Basophils Absolute 0.0 0.0 - 0.1 K/uL    Comment: Performed at [facility], [place] [address], [place], [place]  [address]  Comprehensive metabolic panel     Status: Abnormal   Collection Time: 09/22/17  1:27 AM  Result Value Ref Range   Sodium  135 135 - 145 mmol/L   Potassium 4.8 3.5 - 5.1 mmol/L   Chloride 95 (L) 101 - 111 mmol/L   CO2 21 (L) 22 - 32 mmol/L   Glucose, Bld 599 (HH) 65 - 99 mg/dL    Comment: CRITICAL RESULT CALLED TO, READ BACK BY AND VERIFIED WITH: E COGGINS,RN 09/22/17 0223 RHOLMES    BUN 68 (H) 6 - 20 mg/dL   Creatinine, Ser 7.50 (H) 0.44 - 1.00 mg/dL   Calcium 7.9 (L) 8.9 - 10.3 mg/dL   Total Protein 5.7 (L) 6.5 - 8.1 g/dL   Albumin 1.8 (L) 3.5 - 5.0 g/dL   AST 16 15 - 41 U/L   ALT 10 (L) 14 - 54 U/L   Alkaline Phosphatase 98 38 - 126 U/L   Total Bilirubin 0.3 0.3 - 1.2 mg/dL   GFR calc non Af Amer 6 (L) >60 mL/min   GFR calc Af Amer 7 (L) >60 mL/min    Comment: (NOTE) The eGFR has been calculated using the CKD EPI equation. This calculation has not been validated in all clinical situations. eGFR's persistently <60 mL/min signify possible Chronic Kidney Disease.    Anion gap 19 (H) 5 - 15    Comment: Performed at [facility], [place] [address], [place], [place] [address]  I-Stat Chem 8, ED     Status: Abnormal   Collection Time: 09/22/17  1:35 AM  Result Value Ref Range   Sodium 133 (L) 135 - 145 mmol/L   Potassium 4.6 3.5 - 5.1 mmol/L   Chloride 96 (L) 101 - 111 mmol/L   BUN 58 (H) 6 - 20 mg/dL   Creatinine, Ser 7.50 (H) 0.44 - 1.00 mg/dL   Glucose, Bld 583 (HH) 65 - 99 mg/dL   Calcium, Ion 0.97 (L) 1.15 - 1.40 mmol/L   TCO2 23 22 - 32 mmol/L   Hemoglobin 10.2 (L) 12.0 - 15.0 g/dL   HCT 30.0 (L) 36.0 - 46.0 %   Comment NOTIFIED PHYSICIAN   CBG monitoring, ED     Status: Abnormal   Collection Time: 09/22/17  3:03 AM  Result Value Ref Range   Glucose-Capillary 543 (HH) 65 - 99 mg/dL  CBG monitoring, ED     Status: Abnormal   Collection Time: 09/22/17  4:19 AM  Result Value Ref Range   Glucose-Capillary 503 (HH) 65 - 99 mg/dL  CBG monitoring,  ED     Status: Abnormal   Collection Time: 09/22/17  5:25 AM  Result Value Ref Range   Glucose-Capillary 438 (H) 65 - 99 mg/dL  Basic metabolic panel     Status: Abnormal   Collection Time: 09/22/17  5:26 AM  Result Value Ref Range   Sodium 138 135 - 145 mmol/L   Potassium 4.3 3.5 - 5.1 mmol/L   Chloride 97 (L) 101 - 111 mmol/L   CO2 23 22 - 32 mmol/L   Glucose, Bld 456 (H) 65 - 99 mg/dL   BUN 70 (H) 6 - 20 mg/dL   Creatinine, Ser 7.70 (H) 0.44 - 1.00 mg/dL   Calcium 7.8 (L) 8.9 - 10.3 mg/dL   GFR calc non Af Amer 6 (L) >60 mL/min   GFR calc Af Amer 7 (L) >60 mL/min    Comment: (NOTE) The eGFR has been calculated using the CKD EPI equation. This calculation has not been validated in all clinical situations. eGFR's persistently <60 mL/min signify possible Chronic Kidney Disease.    Anion gap 18 (H) 5 -  15    Comment: Performed at [facility], [place] [address] [place], [place] [address]  CBC     Status: Abnormal   Collection Time: 09/22/17  5:26 AM  Result Value Ref Range   WBC 10.5 4.0 - 10.5 K/uL   RBC 3.30 (L) 3.87 - 5.11 MIL/uL   Hemoglobin 9.4 (L) 12.0 - 15.0 g/dL   HCT 29.3 (L) 36.0 - 46.0 %   MCV 88.8 78.0 - 100.0 fL   MCH 28.5 26.0 - 34.0 pg   MCHC 32.1 30.0 - 36.0 g/dL   RDW 15.8 (H) 11.5 - 15.5 %   Platelets 293 150 - 400 K/uL    Comment: Performed at [facility], [place] [address], [place], [place] [address]  Hemoglobin A1c     Status: Abnormal   Collection Time: 09/22/17  5:26 AM  Result Value Ref Range   Hgb A1c MFr Bld 12.0 (H) 4.8 - 5.6 %    Comment: (NOTE) Pre diabetes:          5.7%-6.4% Diabetes:              >6.4% Glycemic control for   <7.0% adults with diabetes    Mean Plasma Glucose 297.7 mg/dL    Comment: Performed at [place] [address], [place], [place] [address]  CBG monitoring, ED     Status: Abnormal   Collection Time: 09/22/17  6:32 AM  Result Value Ref Range   Glucose-Capillary 344 (H)  65 - 99 mg/dL  CBG monitoring, ED     Status: Abnormal   Collection Time: 09/22/17  7:36 AM  Result Value Ref Range   Glucose-Capillary 266 (H) 65 - 99 mg/dL  hCG, serum, qualitative     Status: None   Collection Time: 09/22/17  8:04 AM  Result Value Ref Range   Preg, Serum NEGATIVE NEGATIVE    Comment:        THE SENSITIVITY OF THIS METHODOLOGY IS >10 mIU/mL. Performed at [facility], [place] [address] [place], [place] [address]   CBG monitoring, ED     Status: Abnormal   Collection Time: 09/22/17  8:37 AM  Result Value Ref Range   Glucose-Capillary 230 (H) 65 - 99 mg/dL  CBG monitoring, ED     Status: Abnormal   Collection Time: 09/22/17  9:41 AM  Result Value Ref Range   Glucose-Capillary 199 (H) 65 - 99 mg/dL  Basic metabolic panel     Status: Abnormal   Collection Time: 09/22/17 10:30 AM  Result Value Ref Range   Sodium 141 135 - 145 mmol/L    Comment: REPEATED TO VERIFY   Potassium 4.6 3.5 - 5.1 mmol/L   Chloride 98 (L) 101 - 111 mmol/L    Comment: REPEATED TO VERIFY   CO2 21 (L) 22 - 32 mmol/L    Comment: REPEATED TO VERIFY   Glucose, Bld 178 (H) 65 - 99 mg/dL   BUN 67 (H) 6 - 20 mg/dL   Creatinine, Ser 7.55 (H) 0.44 - 1.00 mg/dL   Calcium 7.7 (L) 8.9 - 10.3 mg/dL   GFR calc non Af Amer 6 (L) >60 mL/min   GFR calc Af Amer 7 (L) >60 mL/min    Comment: (NOTE) The eGFR has been calculated using the CKD EPI equation. This calculation has not been validated in all clinical situations. eGFR's persistently <60 mL/min signify possible Chronic Kidney Disease.    Anion gap 22 (H)  5 - 15    Comment: REPEATED TO VERIFY Performed at [facility], [place] [address], [place], [place] [address]   CBG monitoring, ED     Status: Abnormal   Collection Time: 09/22/17 10:57 AM  Result Value Ref Range   Glucose-Capillary 135 (H) 65 - 99 mg/dL  CBG monitoring, ED     Status: Abnormal   Collection Time: 09/22/17 11:41 AM  Result Value Ref Range    Glucose-Capillary 115 (H) 65 - 99 mg/dL   Comment 1 Notify RN    Comment 2 Document in Chart   CBG monitoring, ED     Status: None   Collection Time: 09/22/17  1:12 PM  Result Value Ref Range   Glucose-Capillary 82 65 - 99 mg/dL  CBG monitoring, ED     Status: None   Collection Time: 09/22/17  2:15 PM  Result Value Ref Range   Glucose-Capillary 72 65 - 99 mg/dL  Basic metabolic panel     Status: Abnormal   Collection Time: 09/22/17  2:18 PM  Result Value Ref Range   Sodium 141 135 - 145 mmol/L   Potassium 4.6 3.5 - 5.1 mmol/L   Chloride 103 101 - 111 mmol/L   CO2 20 (L) 22 - 32 mmol/L   Glucose, Bld 83 65 - 99 mg/dL   BUN 65 (H) 6 - 20 mg/dL   Creatinine, Ser 6.90 (H) 0.44 - 1.00 mg/dL   Calcium 7.0 (L) 8.9 - 10.3 mg/dL   GFR calc non Af Amer 7 (L) >60 mL/min   GFR calc Af Amer 8 (L) >60 mL/min    Comment: (NOTE) The eGFR has been calculated using the CKD EPI equation. This calculation has not been validated in all clinical situations. eGFR's persistently <60 mL/min signify possible Chronic Kidney Disease.    Anion gap 18 (H) 5 - 15    Comment: Performed at [facility], [place] [address] [place], [place] [address]  CBG monitoring, ED     Status: None   Collection Time: 09/22/17  3:23 PM  Result Value Ref Range   Glucose-Capillary 93 65 - 99 mg/dL  CBG monitoring, ED     Status: Abnormal   Collection Time: 09/22/17  5:12 PM  Result Value Ref Range   Glucose-Capillary 148 (H) 65 - 99 mg/dL  CBG monitoring, ED     Status: Abnormal   Collection Time: 09/22/17  6:38 PM  Result Value Ref Range   Glucose-Capillary 155 (H) 65 - 99 mg/dL  CBG monitoring, ED     Status: Abnormal   Collection Time: 09/22/17  8:46 PM  Result Value Ref Range   Glucose-Capillary 158 (H) 65 - 99 mg/dL   Comment 1 Notify RN    Comment 2 Document in Chart   Basic metabolic panel     Status: Abnormal   Collection Time: 09/22/17  8:48 PM  Result Value Ref Range   Sodium 137 135  - 145 mmol/L   Potassium 5.8 (H) 3.5 - 5.1 mmol/L    Comment: DELTA CHECK NOTED SLIGHT HEMOLYSIS REPEATED TO VERIFY    Chloride 98 (L) 101 - 111 mmol/L   CO2 20 (L) 22 - 32 mmol/L   Glucose, Bld 180 (H) 65 - 99 mg/dL   BUN 78 (H) 6 - 20 mg/dL   Creatinine, Ser 8.69 (H) 0.44 - 1.00 mg/dL   Calcium 7.8 (L) 8.9 - 10.3 mg/dL   GFR calc non Af Amer 5 (L) >  60 mL/min   GFR calc Af Amer 6 (L) >60 mL/min    Comment: (NOTE) The eGFR has been calculated using the CKD EPI equation. This calculation has not been validated in all clinical situations. eGFR's persistently <60 mL/min signify possible Chronic Kidney Disease.    Anion gap 19 (H) 5 - 15    Comment: Performed at [facility], [place] [address], [place], [place] [address]  CBG monitoring, ED     Status: Abnormal   Collection Time: 09/22/17 10:15 PM  Result Value Ref Range   Glucose-Capillary 186 (H) 65 - 99 mg/dL   Ct Pelvis W Contrast  Result Date: 09/22/2017 CLINICAL DATA:  Initial evaluation for pelvic abscess. EXAM: CT PELVIS WITH CONTRAST TECHNIQUE: Multidetector CT imaging of the pelvis was performed using the standard protocol following the bolus administration of intravenous contrast. CONTRAST:  39m ISOVUE-300 IOPAMIDOL (ISOVUE-300) INJECTION 61% COMPARISON:  Prior CT from 06/11/2017. FINDINGS: Urinary Tract: No obvious hydroureter. Bladder largely decompressed. Diffuse circumferential bladder wall thickening likely related incomplete distension. Bowel: No acute abnormality seen about the visualized bowels. Bowels are somewhat centralized secondary to large volume ascites. Vascular/Lymphatic: Extensive atherosclerotic change seen throughout the visualized vasculature. No obvious vascular abnormality. Prominent lymph nodes noted within the bilateral inguinal regions, measuring up to 13 mm on the right and 17 mm on the left. No other discernible adenopathy. Reproductive: Extensive vascular calcifications about the  uterus and ovaries. Otherwise unremarkable. Other: Large volume ascites within the visualized abdomen and pelvis. No free intraperitoneal air. Musculoskeletal: Diffuse anasarca. There is a more focal area of soft tissue density within the subcutaneous fat at the inferior left gluteal region measuring approximately 3.5 x 4.1 x 2.6 cm, suspicious for phlegmon and/or early abscess (series 3, image 141). No other discrete collections identified. Sacral decubitus ulcer with the erosion of the underlying distal sacrum/coccyx noted. No loculated collection within this region. No other acute osseous abnormality. No worrisome lytic or blastic osseous lesions. Probable superimposed injection sites noted within the subcutaneous fat of the anterior abdomen. IMPRESSION: 1. 3.5 x 4.1 x 2.6 cm soft tissue lesion within the subcutaneous fat of the inferior left gluteal region, suspicious for phlegmon and/or early abscess. Correlation with physical exam recommended. 2. Sacral decubitus ulcer with erosive changes within the underlying sacrum/coccyx. 3. Large volume ascites with diffuse anasarca. 4. Prominent bilateral inguinal adenopathy as above, nonspecific, but may be reactive in nature. Electronically Signed   By: BJeannine BogaM.D.   On: 09/22/2017 03:32   Dg Foot Complete Left  Result Date: 09/22/2017 CLINICAL DATA:  Blood blister on the left heel.  Diabetic. EXAM: LEFT FOOT - COMPLETE 3+ VIEW COMPARISON:  03/24/2017 FINDINGS: Old healed fracture deformity of the distal left fifth metatarsal bone. No evidence of acute fracture or dislocation of the left foot. No focal bone lesion. No cortical erosion or sclerosis. Prominent vascular calcifications. No radiopaque soft tissue foreign bodies. IMPRESSION: No acute bony abnormalities. No evidence to suggest osteomyelitis. Vascular calcifications. Electronically Signed   By: WLucienne CapersM.D.   On: 09/22/2017 02:35    Pending Labs Unresulted Labs (From admission,  onward)   Start     Ordered   09/22/17 0109  Blood culture (routine x 2)  BLOOD CULTURE X 2,   STAT     09/22/17 0110      Vitals/Pain Today's Vitals   09/22/17 2230 09/22/17 2247 09/22/17 2300 09/22/17 2320  BP: (!) 150/68 (!) 155/67 (!) 158/72 (!) 159/73  Pulse: 68 78 77 79  Resp: '20 17 10 15  ' Temp:      TempSrc:      SpO2: (!) 88% 91% 90% 91%  PainSc:        Isolation Precautions No active isolations  Medications Medications  amLODipine (NORVASC) tablet 10 mg (10 mg Oral Not Given 09/22/17 1827)  atorvastatin (LIPITOR) tablet 40 mg (40 mg Oral Given 09/22/17 1835)  cinacalcet (SENSIPAR) tablet 60 mg (60 mg Oral Given 09/22/17 1835)  multivitamin (RENA-VIT) tablet 1 tablet (1 tablet Oral Given 09/22/17 2236)  cloNIDine (CATAPRES) tablet 0.3 mg (0.3 mg Oral Given 09/22/17 2237)  ferric citrate (AURYXIA) tablet 420 mg (420 mg Oral Given 09/22/17 1807)  gabapentin (NEURONTIN) capsule 100 mg (100 mg Oral Given 09/22/17 2237)  hydrALAZINE (APRESOLINE) tablet 100 mg (100 mg Oral Not Given 09/22/17 1351)  isosorbide mononitrate (IMDUR) 24 hr tablet 120 mg (has no administration in time range)  labetalol (NORMODYNE) tablet 300 mg (0 mg Oral Hold 09/22/17 1538)  LORazepam (ATIVAN) tablet 0.5 mg (has no administration in time range)  metoCLOPramide (REGLAN) tablet 2.5 mg (2.5 mg Oral Given 09/22/17 1807)  pantoprazole (PROTONIX) EC tablet 40 mg (40 mg Oral Given 09/22/17 1005)  sertraline (ZOLOFT) tablet 50 mg (50 mg Oral Given 09/22/17 1040)  sevelamer carbonate (RENVELA) tablet 2,400 mg (2,400 mg Oral Given 09/22/17 1807)  acetaminophen (TYLENOL) tablet 650 mg (has no administration in time range)    Or  acetaminophen (TYLENOL) suppository 650 mg (has no administration in time range)  ondansetron (ZOFRAN) tablet 4 mg (has no administration in time range)    Or  ondansetron (ZOFRAN) injection 4 mg (has no administration in time range)  dextrose 50 % solution 25 mL (has no administration in  time range)  0.9 %  sodium chloride infusion ( Intravenous Stopped 09/22/17 1408)  piperacillin-tazobactam (ZOSYN) IVPB 3.375 g (3.375 g Intravenous New Bag/Given 09/22/17 2238)  hydrALAZINE (APRESOLINE) injection 10 mg (10 mg Intravenous Given 09/22/17 1021)  0.45 % sodium chloride infusion ( Intravenous Not Given 09/22/17 1350)  insulin aspart (novoLOG) injection 0-9 Units (has no administration in time range)  insulin aspart (novoLOG) injection 0-5 Units (0 Units Subcutaneous Not Given 09/22/17 2221)  insulin glargine (LANTUS) injection 10 Units (has no administration in time range)  piperacillin-tazobactam (ZOSYN) IVPB 3.375 g (0 g Intravenous Stopped 09/22/17 0252)  vancomycin (VANCOCIN) 1,500 mg in sodium chloride 0.9 % 500 mL IVPB (0 mg Intravenous Stopped 09/22/17 0514)  iopamidol (ISOVUE-300) 61 % injection 100 mL (80 mLs Intravenous Contrast Given 09/22/17 0246)  insulin glargine (LANTUS) injection 10 Units (10 Units Subcutaneous Given 09/22/17 1615)    Mobility manual wheelchair

## 2017-09-22 NOTE — ED Notes (Signed)
Pt transferred to [facility] via [facility]

## 2017-09-22 NOTE — Progress Notes (Signed)
ARCENIA SCARBRO is a 42 y.o. female with history of ESRD on hemodialysis Monday Wednesday Friday, CHF with ascites, diabetes mellitus type 2, anemia, hypertension who has had a cardiac arrest in November 2018 during vascular procedure was brought to the ER for worsening pain and swelling in the left buttock area.  Patient has sacral decubitus ulcer and pyogenic granuloma at the same time.  Patient noticed that patient has been having worsening swelling and pain on the left buttock area over the last 1 week and also has developed ulceration on the left heel. On arrival pt was also found to be in DKA . CT pelvis shows abscess in the left buttock area.    Exam:  Pt alert and in mild distress from pain.  Lungs diminished at bases.  CVS s1s2,  Abdomen is soft non tender bowel sounds good.  Extremities left heel unceration. , right BKA,  Skin: Sacral /left decubitus.     Plan:  Resume IV antibiotics for developing abscess.  Pain control.  BMP every 6 hours, change to SQ lantus.   HD in am Renal consulted.  Please call surgery consult after transfer to [facility].    Hosie Poisson, MD [phone]

## 2017-09-22 NOTE — ED Notes (Signed)
Paged Akula,MD to see if she wants to discontinue Insulin drip and give subQ insulin. Glucose stabilizer wants it to be held and deliver 0 units. Gap is 18.

## 2017-09-22 NOTE — ED Provider Notes (Signed)
[place] DEPT Provider Note   CSN: [id number] Arrival date & time: 09/21/17  2329     History   Chief Complaint Chief Complaint  Patient presents with  . Abscess    HPI [name] is a 42 y.o. female.  The history is provided by the patient.  Illness  This is a new problem. The current episode started more than 1 week ago (5 days for heel blood blister > 1 week for change in color and pain in buttocks). The problem occurs constantly. The problem has been gradually worsening. Pertinent negatives include no chest pain, no abdominal pain, no headaches and no shortness of breath. Nothing aggravates the symptoms. Nothing relieves the symptoms. Treatments tried: already on antibiotics and being seen at wound care center. The treatment provided no relief.    Past Medical History:  Diagnosis Date  . Arthritis   . CHF (congestive heart failure) ([place])   . Chronic anemia    2nd to renal disease  . CIN III (cervical intraepithelial neoplasia grade III) with severe dysplasia    S/P LEEP AND CONE  . Coronary artery disease    Status post cardiac catheterization June 2012 scattered coronary artery disease/atherosclerosis with 70-80% stenosis in a small right PDA.  . Diabetic Charcot's joint disease ([place])   . DM (diabetes mellitus) ([place])    Long-term insulin  . End stage renal disease on dialysis [facility]) 05/02/11   "[facility]"; [facility]; M; W, F ("got it 04/15/2017 because of Thanksgiving")  . Fracture of 5th metatarsal 2016   Right  . Gastroparesis   . Heart murmur   . 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 History of abscesses in groins 12/06/2010  . Hyperlipidemia    Hypertriglyceridemia 449 HDL 25  . Hypertension   . Migraines    "just on dialysis days"  . Peripheral neuropathy    related to DM  . Peripheral vascular disease ([place])    Tibial occlusive disease evaluated by Dr. Kellie Simmering in August 2011. Medical therapy  . Renal insufficiency    Dialysis  since 2012  . Seizures ([place])    "only when her sugar drops" (04/17/2017)  . Tobacco use disorder    Discontinued March 2012  . Trimalleolar fracture of ankle, closed 02/09/2015   Right    Patient Active Problem List   Diagnosis Date Noted  . Labile blood glucose   . Hypertensive crisis   . Leukocytosis   . Hypoglycemia   . Postoperative pain   . Phantom limb pain ([place])   . Subacute osteomyelitis, other site ([place])   . Pain   . Diabetic osteomyelitis ([place])   . Acute blood loss anemia   . Anemia of chronic disease   . ESRD on dialysis ([place])   . Benign essential HTN   . Diabetes mellitus type 2 in nonobese (HCC)   . Unilateral complete BKA, right, initial encounter ([place]) 06/23/2017  . Post-operative pain   . Unilateral complete BKA, right, sequela ([place])   . Stage IV pressure ulcer of sacral region ([place]) 06/16/2017  . Wound healing, delayed   . Sacral osteomyelitis ([place])   . Chronic renal failure   . Brain mass 06/11/2017  . Fall 06/11/2017  . 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 History of ETT   . Pressure injury of skin 04/22/2017  . Acute respiratory failure with hypoxia ([place])   . Cardiac arrest, cause unspecified ([place])   . Non-healing  ulcer ([place])   . Hypertension 03/24/2017  . Hypertensive urgency 03/24/2017  . Cellulitis in diabetic foot ([place]) 03/24/2017  . Cellulitis 03/24/2017  . Chronic pain 03/24/2017  . Diabetic ulcer of lower leg ([place]) 03/24/2017  . Gangrene of toe of right foot ([place])   . Acute on chronic diastolic CHF (congestive heart failure) ([place]) 02/22/2016  . Fluid overload   . Pericardial effusion   . Ascites 02/15/2016  . Uncontrolled diabetes mellitus with end-stage renal disease ([place]) 02/16/2015  . Bimalleolar ankle fracture 02/12/2015  . CAD (coronary artery disease) 02/09/2015  . PAD (peripheral artery disease) ([place])   . Gastroparesis   . Chronic anemia   . Menorrhagia with regular cycle 01/04/2014  .  Dysmenorrhea 01/04/2014  . Nausea & vomiting 10/16/2011  . History of noncompliance with medical treatment 05/09/2011  . Chronic UTI 05/09/2011  . CIN III (cervical intraepithelial neoplasia grade III) with severe dysplasia   . History of abscesses in groins 12/06/2010  . End stage renal disease on dialysis ([place])   . Coronary artery disease   . Hyperlipidemia   . 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 NONDEPENDENT TOBACCO USE DISORDER 12/20/2009  . Essential hypertension, benign 12/20/2009  . NEPHROTIC SYNDROME 12/20/2009    Past Surgical History:  Procedure Laterality Date  . ABDOMINAL AORTOGRAM W/LOWER EXTREMITY [name] 03/27/2017   Procedure: ABDOMINAL AORTOGRAM W/LOWER EXTREMITY;  Surgeon: Conrad Hamburg, MD;  Location: [place] [facility];  Service: Cardiovascular;  Laterality: [name];  . AMPUTATION Right 05/09/2017   Procedure: AMPUTATION TRANSMETATARSAL OF RIGHT FOOT;  Surgeon: Serafina Mitchell, MD;  Location: [place];  Service: Vascular;  Laterality: Right;  . AMPUTATION Right 06/17/2017   Procedure: AMPUTATION BELOW KNEE RIGHT;  Surgeon: Serafina Mitchell, MD;  [facility];  Service: Vascular;  Laterality: Right;  . AV FISTULA PLACEMENT Left 04/2010   "lower arm"  . CARDIAC CATHETERIZATION [name] 02/10/2015   Procedure: Left Heart Cath and Coronary Angiography;  Surgeon: Dixie Dials, MD;  Location: [place] [facility];  Service: Cardiovascular;  Laterality: [name];  . CARDIAC CATHETERIZATION [name] 02/21/2016   Procedure: Right Heart Cath;  Surgeon: Jolaine Artist, MD;  Location: [place] [facility];  Service: Cardiovascular;  Laterality: [name];  . CATARACT EXTRACTION W/ INTRAOCULAR LENS  IMPLANT, BILATERAL Bilateral   . CERVICAL BIOPSY  W/ LOOP ELECTRODE EXCISION     h/o  . CERVICAL CONE BIOPSY     h/o  . COLPOSCOPY    . DILATION AND CURETTAGE OF UTERUS  2009  . ENDARTERECTOMY FEMORAL Right 04/16/2017   Procedure: RIGHT FEMORAL ENDARTERECTOMY;  Surgeon: Serafina Mitchell, MD;  Location: [place];   Service: Vascular;  Laterality: Right;  . FRACTURE SURGERY    . HERNIA REPAIR    . INCISION AND DRAINAGE ABSCESS     "groin"  . INSERTION OF DIALYSIS CATHETER Left 04/16/2017   Procedure: INSERTION of temporay DIALYSIS CATHETER, left femoral artery;  Surgeon: Serafina Mitchell, MD;  Location: [place];  Service: Vascular;  Laterality: Left;  . IR HYBRID TRAUMA EMBOLIZATION  04/16/2017  . IR PARACENTESIS  06/12/2017  . IR PARACENTESIS  06/24/2017  . IR PARACENTESIS  07/01/2017  . IR PARACENTESIS  07/10/2017  . IR PARACENTESIS  08/29/2017  . IR PARACENTESIS  09/18/2017  . ORIF ANKLE FRACTURE Right 02/16/2015   Procedure: OPEN REDUCTION INTERNAL FIXATION (ORIF) RIGHT ANKLE FRACTURE;  Surgeon: Altamese Millsboro, MD;  Location: [place];  Service: Orthopedics;  Laterality: Right;  .  PATCH ANGIOPLASTY Right 04/16/2017   Procedure: PATCH ANGIOPLASTY OF RIGHT FEMORAL ARTERY USING BOVINE PERICARDIUM PATCH;  Surgeon: Serafina Mitchell, MD;  [facility];  Service: Vascular;  Laterality: Right;  . UMBILICAL HERNIA REPAIR  X 2  . WOUND EXPLORATION Right 05/09/2017   Procedure: EXPLORATION RIGHT GROIN;  Surgeon: Serafina Mitchell, MD;  [facility];  Service: Vascular;  Laterality: Right;     OB History    Gravida  2   Para  1   Term  1   Preterm      AB  1   Living  1     SAB      TAB      Ectopic      Multiple      Live Births               Home Medications    Prior to Admission medications   Medication Sig Start Date End Date Taking? Authorizing Provider  acetaminophen (TYLENOL) 325 MG tablet Take 1-2 tablets (325-650 mg total) by mouth every 4 (four) hours as needed for mild pain. 07/10/17   Love, Ivan Anchors, PA-C  amLODipine (NORVASC) 10 MG tablet Take 1 tablet (10 mg total) by mouth daily after supper. 07/10/17   Love, Ivan Anchors, PA-C  atorvastatin (LIPITOR) 40 MG tablet Take 1 tablet (40 mg total) by mouth daily at 6 PM. 07/10/17   Love, Ivan Anchors, PA-C  b complex-vitamin c-folic acid  (NEPHRO-VITE) 0.8 MG TABS tablet Take 1 tablet by mouth daily.    [provider]  cefTAZidime 2 g in sodium chloride 0.9 % 100 mL Inject 2 g into the vein every Monday, Wednesday, and Friday with hemodialysis. 07/10/17   Love, Ivan Anchors, PA-C  cinacalcet (SENSIPAR) 30 MG tablet Take 2 tablets (60 mg total) by mouth daily with supper. 07/10/17   Love, Ivan Anchors, PA-C  cloNIDine (CATAPRES) 0.3 MG tablet Take 1 tablet (0.3 mg total) by mouth 3 (three) times daily. 07/10/17   Love, Ivan Anchors, PA-C  docusate (COLACE) 50 MG/5ML liquid Take 100 mg by mouth 3 (three) times daily.    [provider]  ferric citrate (AURYXIA) 1 GM 210 MG(Fe) tablet Take 2 tablets (420 mg total) by mouth 3 (three) times daily with meals. 07/10/17   Love, Ivan Anchors, PA-C  gabapentin (NEURONTIN) 100 MG capsule Take 1 capsule (100 mg total) by mouth 2 (two) times daily. 07/10/17   Love, Ivan Anchors, PA-C  hydrALAZINE (APRESOLINE) 100 MG tablet Take 1 tablet (100 mg total) by mouth every 8 (eight) hours. 07/10/17   Love, Ivan Anchors, PA-C  Insulin Glargine (LANTUS SOLOSTAR) 100 UNIT/ML Solostar Pen Inject 10 Units into the skin daily at 10 pm. 07/10/17   Love, Ivan Anchors, PA-C  isosorbide dinitrate (ISORDIL) 30 MG tablet Take 2 tablets (60 mg total) by mouth 2 (two) times daily. Give two 30 mg tablets to = 60 mg BID 07/10/17   Love, Ivan Anchors, PA-C  labetalol (NORMODYNE) 300 MG tablet Take 1 tablet (300 mg total) by mouth 3 (three) times daily. 07/10/17   Love, Ivan Anchors, PA-C  LORazepam (ATIVAN) 0.5 MG tablet Take 1 tablet (0.5 mg total) by mouth every 12 (twelve) hours as needed for anxiety. 07/10/17   Love, Ivan Anchors, PA-C  Methoxy PEG-Epoetin Beta (MIRCERA IJ) Inject 225 mcg as directed every 21 ( twenty-one) days. (While at dialysis)    Jamal Maes, MD  metoCLOPramide (REGLAN) 5 MG  tablet Take 0.5 tablets (2.5 mg total) by mouth 3 (three) times daily before meals. 07/10/17   Love, Ivan Anchors, PA-C  ondansetron (ZOFRAN) 4 MG tablet  Take 4 mg by mouth every 6 (six) hours as needed for nausea or vomiting.    [provider]  pantoprazole (PROTONIX) 40 MG tablet Take 1 tablet (40 mg total) by mouth daily. 07/10/17   Love, Ivan Anchors, PA-C  sertraline (ZOLOFT) 50 MG tablet Take 1 tablet (50 mg total) by mouth daily. 07/10/17   Love, Ivan Anchors, PA-C  sevelamer carbonate (RENVELA) 800 MG tablet Take 3 tablets (2,400 mg total) by mouth 3 (three) times daily with meals. Give 3 tablets to = 2400 mg TID 07/10/17   Love, Ivan Anchors, PA-C  vancomycin (VANCOCIN) 1-5 GM/200ML-% SOLN Inject 200 mLs (1,000 mg total) into the vein every Monday, Wednesday, and Friday with hemodialysis. 07/11/17   Bary Leriche, PA-C    Family History Family History  Problem Relation Age of Onset  . Diabetes Mother   . Hypertension Mother   . Diabetes Father   . Hyperlipidemia Father   . Hypertension Father   . Diabetes Sister   . Stroke Maternal Grandmother   . Diabetes Sister   . Breast cancer Maternal Aunt        Age 68's    Social History Social History   Tobacco Use  . Smoking status: Former Smoker    Packs/day: 0.30    Years: 10.00    Pack years: 3.00    Types: Cigarettes    Last attempt to quit: 08/05/2011    Years since quitting: 6.1  . Smokeless tobacco: Never Used  Substance Use Topics  . Alcohol use: No  . Drug use: No     Allergies   Cephalexin and Sulfamethoxazole-trimethoprim   Review of Systems Review of Systems  Constitutional: Negative for fever.  Respiratory: Negative for shortness of breath.   Cardiovascular: Negative for chest pain.  Gastrointestinal: Negative for abdominal pain and vomiting.  Musculoskeletal: Negative for back pain.  Skin: Positive for color change and wound.  Neurological: Negative for headaches.  All other systems reviewed and are negative.    Physical Exam Updated Vital Signs BP (!) 186/87   Pulse 91   Temp 97.9 F (36.6 C) (Oral)   Resp 18   LMP 10/09/2015 Comment: pt on  dialysis  SpO2 99%   Physical Exam  Constitutional: She is oriented to person, place, and time. She appears well-developed and well-nourished. No distress.  HENT:  Head: Normocephalic and atraumatic.  Mouth/Throat: No oropharyngeal exudate.  Eyes: Pupils are equal, round, and reactive to light. Conjunctivae are normal.  Neck: Normal range of motion. Neck supple.  Cardiovascular: Normal rate, regular rhythm, normal heart sounds and intact distal pulses.  Pulmonary/Chest: Effort normal and breath sounds normal. No stridor. She has no wheezes. She has no rales.  Abdominal: Soft. Bowel sounds are normal. She exhibits no mass. There is no tenderness. There is no rebound and no guarding.  Musculoskeletal: Normal range of motion. She exhibits tenderness. She exhibits no deformity.       Feet:  Neurological: She is alert and oriented to person, place, and time. She displays normal reflexes.  Skin: Skin is warm and dry. Capillary refill takes less than 2 seconds. There is erythema.     Psychiatric: She has a normal mood and affect.     ED Treatments / Results  Labs (all labs ordered are listed, but  only abnormal results are displayed) Results for orders placed or performed during the hospital encounter of 09/22/17  CBC with Differential/Platelet  Result Value Ref Range   WBC 11.9 (H) 4.0 - 10.5 K/uL   RBC 3.46 (L) 3.87 - 5.11 MIL/uL   Hemoglobin 9.7 (L) 12.0 - 15.0 g/dL   HCT 31.2 (L) 36.0 - 46.0 %   MCV 90.2 78.0 - 100.0 fL   MCH 28.0 26.0 - 34.0 pg   MCHC 31.1 30.0 - 36.0 g/dL   RDW 15.9 (H) 11.5 - 15.5 %   Platelets 332 150 - 400 K/uL   Neutrophils Relative % 82 %   Neutro Abs 9.7 (H) 1.7 - 7.7 K/uL   Lymphocytes Relative 9 %   Lymphs Abs 1.0 0.7 - 4.0 K/uL   Monocytes Relative 7 %   Monocytes Absolute 0.9 0.1 - 1.0 K/uL   Eosinophils Relative 2 %   Eosinophils Absolute 0.3 0.0 - 0.7 K/uL   Basophils Relative 0 %   Basophils Absolute 0.0 0.0 - 0.1 K/uL  Comprehensive  metabolic panel  Result Value Ref Range   Sodium 135 135 - 145 mmol/L   Potassium 4.8 3.5 - 5.1 mmol/L   Chloride 95 (L) 101 - 111 mmol/L   CO2 21 (L) 22 - 32 mmol/L   Glucose, Bld 599 (HH) 65 - 99 mg/dL   BUN 68 (H) 6 - 20 mg/dL   Creatinine, Ser 7.50 (H) 0.44 - 1.00 mg/dL   Calcium 7.9 (L) 8.9 - 10.3 mg/dL   Total Protein 5.7 (L) 6.5 - 8.1 g/dL   Albumin 1.8 (L) 3.5 - 5.0 g/dL   AST 16 15 - 41 U/L   ALT 10 (L) 14 - 54 U/L   Alkaline Phosphatase 98 38 - 126 U/L   Total Bilirubin 0.3 0.3 - 1.2 mg/dL   GFR calc non Af Amer 6 (L) >60 mL/min   GFR calc Af Amer 7 (L) >60 mL/min   Anion gap 19 (H) 5 - 15  I-Stat Chem 8, ED  Result Value Ref Range   Sodium 133 (L) 135 - 145 mmol/L   Potassium 4.6 3.5 - 5.1 mmol/L   Chloride 96 (L) 101 - 111 mmol/L   BUN 58 (H) 6 - 20 mg/dL   Creatinine, Ser 7.50 (H) 0.44 - 1.00 mg/dL   Glucose, Bld 583 (HH) 65 - 99 mg/dL   Calcium, Ion 0.97 (L) 1.15 - 1.40 mmol/L   TCO2 23 22 - 32 mmol/L   Hemoglobin 10.2 (L) 12.0 - 15.0 g/dL   HCT 30.0 (L) 36.0 - 46.0 %   Comment NOTIFIED PHYSICIAN   CBG monitoring, ED  Result Value Ref Range   Glucose-Capillary 543 (HH) 65 - 99 mg/dL   Ct Pelvis W Contrast  Result Date: 09/22/2017 CLINICAL DATA:  Initial evaluation for pelvic abscess. EXAM: CT PELVIS WITH CONTRAST TECHNIQUE: Multidetector CT imaging of the pelvis was performed using the standard protocol following the bolus administration of intravenous contrast. CONTRAST:  78mL ISOVUE-300 IOPAMIDOL (ISOVUE-300) INJECTION 61% COMPARISON:  Prior CT from 06/11/2017. FINDINGS: Urinary Tract: No obvious hydroureter. Bladder largely decompressed. Diffuse circumferential bladder wall thickening likely related incomplete distension. Bowel: No acute abnormality seen about the visualized bowels. Bowels are somewhat centralized secondary to large volume ascites. Vascular/Lymphatic: Extensive atherosclerotic change seen throughout the visualized vasculature. No obvious  vascular abnormality. Prominent lymph nodes noted within the bilateral inguinal regions, measuring up to 13 mm on the right and 17 mm on the  left. No other discernible adenopathy. Reproductive: Extensive vascular calcifications about the uterus and ovaries. Otherwise unremarkable. Other: Large volume ascites within the visualized abdomen and pelvis. No free intraperitoneal air. Musculoskeletal: Diffuse anasarca. There is a more focal area of soft tissue density within the subcutaneous fat at the inferior left gluteal region measuring approximately 3.5 x 4.1 x 2.6 cm, suspicious for phlegmon and/or early abscess (series 3, image 141). No other discrete collections identified. Sacral decubitus ulcer with the erosion of the underlying distal sacrum/coccyx noted. No loculated collection within this region. No other acute osseous abnormality. No worrisome lytic or blastic osseous lesions. Probable superimposed injection sites noted within the subcutaneous fat of the anterior abdomen. IMPRESSION: 1. 3.5 x 4.1 x 2.6 cm soft tissue lesion within the subcutaneous fat of the inferior left gluteal region, suspicious for phlegmon and/or early abscess. Correlation with physical exam recommended. 2. Sacral decubitus ulcer with erosive changes within the underlying sacrum/coccyx. 3. Large volume ascites with diffuse anasarca. 4. Prominent bilateral inguinal adenopathy as above, nonspecific, but may be reactive in nature. Electronically Signed   By: Jeannine Boga M.D.   On: 09/22/2017 03:32   US Venous Img Lower Unilateral Left  Result Date: 09/02/2017 CLINICAL DATA:  Left leg swelling and injury. EXAM: LEFT LOWER EXTREMITY VENOUS DOPPLER ULTRASOUND TECHNIQUE: Gray-scale sonography with graded compression, as well as color Doppler and duplex ultrasound were performed to evaluate the lower extremity deep venous systems from the level of the common femoral vein and including the common femoral, femoral, profunda femoral,  popliteal and calf veins including the posterior tibial, peroneal and gastrocnemius veins when visible. The superficial great saphenous vein was also interrogated. Spectral Doppler was utilized to evaluate flow at rest and with distal augmentation maneuvers in the common femoral, femoral and popliteal veins. COMPARISON:  None. FINDINGS: Contralateral Common Femoral Vein: Not visualized because the patient was unable to complete the examination. Common Femoral Vein: No evidence of thrombus. Normal compressibility, respiratory phasicity and response to augmentation. Saphenofemoral Junction: No evidence of thrombus. Normal compressibility. Profunda Femoral Vein: Not visualized. Femoral Vein: Normal compressibility in the proximal and mid left femoral vein. Limited evaluation of the distal left femoral vein. Popliteal Vein: No evidence of thrombus. Normal compressibility, respiratory phasicity and response to augmentation. Calf Veins: Limited evaluation. Superficial Great Saphenous Vein: No evidence of thrombus. Normal compressibility. Other Findings:  None. IMPRESSION: Limited examination because the patient was unable to tolerate the entire examination. No thrombus in the left common femoral vein and left popliteal vein. Left femoral vein and left calf veins were incompletely evaluated without evidence for a large thrombus. Electronically Signed   By: Markus Daft M.D.   On: 09/02/2017 16:01   Dg Foot Complete Left  Result Date: 09/22/2017 CLINICAL DATA:  Blood blister on the left heel.  Diabetic. EXAM: LEFT FOOT - COMPLETE 3+ VIEW COMPARISON:  03/24/2017 FINDINGS: Old healed fracture deformity of the distal left fifth metatarsal bone. No evidence of acute fracture or dislocation of the left foot. No focal bone lesion. No cortical erosion or sclerosis. Prominent vascular calcifications. No radiopaque soft tissue foreign bodies. IMPRESSION: No acute bony abnormalities. No evidence to suggest osteomyelitis. Vascular  calcifications. Electronically Signed   By: Lucienne Capers M.D.   On: 09/22/2017 02:35   Ir Paracentesis  Result Date: 09/18/2017 INDICATION: Abdominal distention. Recurrent ascites. Request for therapeutic paracentesis. EXAM: ULTRASOUND GUIDED RIGHT LOWER QUADRANT PARACENTESIS MEDICATIONS: None. COMPLICATIONS: None immediate. PROCEDURE: Informed written consent was obtained from the patient  after a discussion of the risks, benefits and alternatives to treatment. A timeout was performed prior to the initiation of the procedure. Initial ultrasound scanning demonstrates a large amount of ascites within the right lower abdominal quadrant. The right lower abdomen was prepped and draped in the usual sterile fashion. 1% lidocaine with epinephrine was used for local anesthesia. Following this, a 19 gauge, 7-cm, Yueh catheter was introduced. An ultrasound image was saved for documentation purposes. The paracentesis was performed. The catheter was removed and a dressing was applied. The patient tolerated the procedure well without immediate post procedural complication. FINDINGS: A total of approximately 12.6 L of dark amber/blood-tinged fluid was removed. IMPRESSION: Successful ultrasound-guided paracentesis yielding 12.6 liters of peritoneal fluid. Read by: Ascencion Dike PA-C Electronically Signed   By: Lucrezia Europe M.D.   On: 09/18/2017 11:02   Ir Paracentesis  Result Date: 08/29/2017 INDICATION: Patient with recurrent ascites. Request is made for therapeutic paracentesis. EXAM: ULTRASOUND GUIDED THERAPEUTIC PARACENTESIS MEDICATIONS: 10 mL 2% lidocaine COMPLICATIONS: None immediate. PROCEDURE: Informed written consent was obtained from the patient after a discussion of the risks, benefits and alternatives to treatment. A timeout was performed prior to the initiation of the procedure. Initial ultrasound scanning demonstrates a large amount of ascites within the right lower abdominal quadrant. The right lower abdomen  was prepped and draped in the usual sterile fashion. 2% lidocaine with epinephrine was used for local anesthesia. Following this, a 19 gauge, 7-cm, Yueh catheter was introduced. An ultrasound image was saved for documentation purposes. The paracentesis was performed. The catheter was removed and a dressing was applied. The patient tolerated the procedure well without immediate post procedural complication. FINDINGS: A total of approximately 7.6 liters of yellow fluid was removed. Samples were sent to the laboratory as requested by the clinical team. IMPRESSION: Successful ultrasound-guided therapeutic paracentesis yielding 7.6 liters of peritoneal fluid. Read by: Brynda Greathouse PA-C Electronically Signed   By: Jacqulynn Cadet M.D.   On: 08/29/2017 16:46    EKG None  Radiology Ct Pelvis W Contrast  Result Date: 09/22/2017 CLINICAL DATA:  Initial evaluation for pelvic abscess. EXAM: CT PELVIS WITH CONTRAST TECHNIQUE: Multidetector CT imaging of the pelvis was performed using the standard protocol following the bolus administration of intravenous contrast. CONTRAST:  44mL ISOVUE-300 IOPAMIDOL (ISOVUE-300) INJECTION 61% COMPARISON:  Prior CT from 06/11/2017. FINDINGS: Urinary Tract: No obvious hydroureter. Bladder largely decompressed. Diffuse circumferential bladder wall thickening likely related incomplete distension. Bowel: No acute abnormality seen about the visualized bowels. Bowels are somewhat centralized secondary to large volume ascites. Vascular/Lymphatic: Extensive atherosclerotic change seen throughout the visualized vasculature. No obvious vascular abnormality. Prominent lymph nodes noted within the bilateral inguinal regions, measuring up to 13 mm on the right and 17 mm on the left. No other discernible adenopathy. Reproductive: Extensive vascular calcifications about the uterus and ovaries. Otherwise unremarkable. Other: Large volume ascites within the visualized abdomen and pelvis. No free  intraperitoneal air. Musculoskeletal: Diffuse anasarca. There is a more focal area of soft tissue density within the subcutaneous fat at the inferior left gluteal region measuring approximately 3.5 x 4.1 x 2.6 cm, suspicious for phlegmon and/or early abscess (series 3, image 141). No other discrete collections identified. Sacral decubitus ulcer with the erosion of the underlying distal sacrum/coccyx noted. No loculated collection within this region. No other acute osseous abnormality. No worrisome lytic or blastic osseous lesions. Probable superimposed injection sites noted within the subcutaneous fat of the anterior abdomen. IMPRESSION: 1. 3.5 x 4.1 x 2.6  cm soft tissue lesion within the subcutaneous fat of the inferior left gluteal region, suspicious for phlegmon and/or early abscess. Correlation with physical exam recommended. 2. Sacral decubitus ulcer with erosive changes within the underlying sacrum/coccyx. 3. Large volume ascites with diffuse anasarca. 4. Prominent bilateral inguinal adenopathy as above, nonspecific, but may be reactive in nature. Electronically Signed   By: Jeannine Boga M.D.   On: 09/22/2017 03:32   Dg Foot Complete Left  Result Date: 09/22/2017 CLINICAL DATA:  Blood blister on the left heel.  Diabetic. EXAM: LEFT FOOT - COMPLETE 3+ VIEW COMPARISON:  03/24/2017 FINDINGS: Old healed fracture deformity of the distal left fifth metatarsal bone. No evidence of acute fracture or dislocation of the left foot. No focal bone lesion. No cortical erosion or sclerosis. Prominent vascular calcifications. No radiopaque soft tissue foreign bodies. IMPRESSION: No acute bony abnormalities. No evidence to suggest osteomyelitis. Vascular calcifications. Electronically Signed   By: Lucienne Capers M.D.   On: 09/22/2017 02:35    Procedures Procedures (including critical care time)  Medications Ordered in ED Medications  dextrose 5 %-0.45 % sodium chloride infusion ( Intravenous Hold 09/22/17  0159)  insulin regular (NOVOLIN R,HUMULIN R) 100 Units in sodium chloride 0.9 % 100 mL (1 Units/mL) infusion (4.8 Units/hr Intravenous New Bag/Given 09/22/17 0309)  vancomycin (VANCOCIN) 1,500 mg in sodium chloride 0.9 % 500 mL IVPB (1,500 mg Intravenous New Bag/Given 09/22/17 0305)  piperacillin-tazobactam (ZOSYN) IVPB 3.375 g (0 g Intravenous Stopped 09/22/17 0252)  iopamidol (ISOVUE-300) 61 % injection 100 mL (80 mLs Intravenous Contrast Given 09/22/17 0246)    MDM Reviewed: previous chart, nursing note and vitals Interpretation: labs, x-ray and CT scan (elevated glucose, top normal gap renal failure chronic.  no osteo on xr by me erosion of bone by me on ct) Total time providing critical care: 75-105 minutes. This excludes time spent performing separately reportable procedures and services. Consults: admitting MD and general surgery  CRITICAL CARE Performed by: Carlisle Beers Total critical care time: 90 minutes Critical care time was exclusive of separately billable procedures and treating other patients. Critical care was necessary to treat or prevent imminent or life-threatening deterioration. Critical care was time spent personally by me on the following activities: development of treatment plan with patient and/or surrogate as well as nursing, discussions with consultants, evaluation of patient's response to treatment, examination of patient, obtaining history from patient or surrogate, ordering and performing treatments and interventions, ordering and review of laboratory studies, ordering and review of radiographic studies, pulse oximetry and re-evaluation of patient's condition.  Final Clinical Impressions(s) / ED Diagnoses   359 Case d/w Dr. Lucia Gaskins who took the patient's name    Cordell Coke, MD 09/22/17 2956

## 2017-09-22 NOTE — Consult Note (Signed)
General Surgery [facility]  Reason for Consult: developing left buttock abscess  Referring Physician: Dr. Karleen Hampshire, [facility]  [name] is an 42 y.o. female.  HPI: Patient is a 42 year old female who has developed an area of tenderness and erythema in the left lower buttock over the past 3 days.  Patient denies any drainage.  She is diabetic.  This started out as a small area on the left buttock it has continued to increase in size.  It is painful.  Patient presented to the emergency department for evaluation.  She was in DKA.  She underwent CT scan demonstrating a 4 cm inflammatory area in the lower left buttock suspicious for developing abscess.  General surgery is asked to evaluate for incision and drainage.  Patient has been in the emergency department for slightly less than 24 hours.  She is waiting for transfer to [facility] where she will undergo hemodialysis.  Past Medical History:  Diagnosis Date  . Arthritis   . CHF (congestive heart failure) ([place])   . Chronic anemia    2nd to renal disease  . CIN III (cervical intraepithelial neoplasia grade III) with severe dysplasia    S/P LEEP AND CONE  . Coronary artery disease    Status post cardiac catheterization June 2012 scattered coronary artery disease/atherosclerosis with 70-80% stenosis in a small right PDA.  . Diabetic Charcot's joint disease ([place])   . DM (diabetes mellitus) ([place])    Long-term insulin  . End stage renal disease on dialysis [facility]) 05/02/11   "[facility]"; [facility]; M; W, F ("got it 04/15/2017 because of Thanksgiving")  . Fracture of 5th metatarsal 2016   Right  . Gastroparesis   . Heart murmur   . 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 History of abscesses in groins 12/06/2010  . Hyperlipidemia    Hypertriglyceridemia 449 HDL 25  . Hypertension   . Migraines    "just on dialysis days"  . Peripheral neuropathy    related to DM  . Peripheral vascular disease ([place])    Tibial occlusive  disease evaluated by Dr. Kellie Simmering in August 2011. Medical therapy  . Renal insufficiency    Dialysis since 2012  . Seizures ([place])    "only when her sugar drops" (04/17/2017)  . Tobacco use disorder    Discontinued March 2012  . Trimalleolar fracture of ankle, closed 02/09/2015   Right    Past Surgical History:  Procedure Laterality Date  . ABDOMINAL AORTOGRAM W/LOWER EXTREMITY [name] 03/27/2017   Procedure: ABDOMINAL AORTOGRAM W/LOWER EXTREMITY;  Surgeon: Conrad [place], MD;  Location: [place] [facility];  Service: Cardiovascular;  Laterality: [name];  . AMPUTATION Right 05/09/2017   Procedure: AMPUTATION TRANSMETATARSAL OF RIGHT FOOT;  Surgeon: Serafina Mitchell, MD;  Location: [place];  Service: Vascular;  Laterality: Right;  . AMPUTATION Right 06/17/2017   Procedure: AMPUTATION BELOW KNEE RIGHT;  Surgeon: Serafina Mitchell, MD;  [facility];  Service: Vascular;  Laterality: Right;  . AV FISTULA PLACEMENT Left 04/2010   "lower arm"  . CARDIAC CATHETERIZATION [name] 02/10/2015   Procedure: Left Heart Cath and Coronary Angiography;  Surgeon: Dixie Dials, MD;  Location: [place] [facility];  Service: Cardiovascular;  Laterality: [name];  . CARDIAC CATHETERIZATION [name] 02/21/2016   Procedure: Right Heart Cath;  Surgeon: Jolaine Artist, MD;  Location: [place] [facility];  Service: Cardiovascular;  Laterality: [name];  . CATARACT EXTRACTION W/ INTRAOCULAR LENS  IMPLANT, BILATERAL Bilateral   .  CERVICAL BIOPSY  W/ LOOP ELECTRODE EXCISION     h/o  . CERVICAL CONE BIOPSY     h/o  . COLPOSCOPY    . DILATION AND CURETTAGE OF UTERUS  2009  . ENDARTERECTOMY FEMORAL Right 04/16/2017   Procedure: RIGHT FEMORAL ENDARTERECTOMY;  Surgeon: Serafina Mitchell, MD;  Location: [place];  Service: Vascular;  Laterality: Right;  . FRACTURE SURGERY    . HERNIA REPAIR    . INCISION AND DRAINAGE ABSCESS     "groin"  . INSERTION OF DIALYSIS CATHETER Left 04/16/2017   Procedure: INSERTION of temporay DIALYSIS CATHETER, left  femoral artery;  Surgeon: Serafina Mitchell, MD;  Location: [place];  Service: Vascular;  Laterality: Left;  . IR HYBRID TRAUMA EMBOLIZATION  04/16/2017  . IR PARACENTESIS  06/12/2017  . IR PARACENTESIS  06/24/2017  . IR PARACENTESIS  07/01/2017  . IR PARACENTESIS  07/10/2017  . IR PARACENTESIS  08/29/2017  . IR PARACENTESIS  09/18/2017  . ORIF ANKLE FRACTURE Right 02/16/2015   Procedure: OPEN REDUCTION INTERNAL FIXATION (ORIF) RIGHT ANKLE FRACTURE;  Surgeon: Altamese Chestertown, MD;  Location: [place];  Service: Orthopedics;  Laterality: Right;  . PATCH ANGIOPLASTY Right 04/16/2017   Procedure: PATCH ANGIOPLASTY OF RIGHT FEMORAL ARTERY USING BOVINE PERICARDIUM PATCH;  Surgeon: Serafina Mitchell, MD;  [facility];  Service: Vascular;  Laterality: Right;  . UMBILICAL HERNIA REPAIR  X 2  . WOUND EXPLORATION Right 05/09/2017   Procedure: EXPLORATION RIGHT GROIN;  Surgeon: Serafina Mitchell, MD;  [facility];  Service: Vascular;  Laterality: Right;    Family History  Problem Relation Age of Onset  . Diabetes Mother   . Hypertension Mother   . Diabetes Father   . Hyperlipidemia Father   . Hypertension Father   . Diabetes Sister   . Stroke Maternal Grandmother   . Diabetes Sister   . Breast cancer Maternal Aunt        Age 30's    Social History:  reports that she quit smoking about 6 years ago. Her smoking use included cigarettes. She has a 3.00 pack-year smoking history. She has never used smokeless tobacco. She reports that she does not drink alcohol or use drugs.  Allergies:  Allergies  Allergen Reactions  . Cephalexin Other (See Comments)    Reaction unknown- Childhood allergy Tolerated Ceftriaxone, ceftazidime, zosyn, augmentin in the past  . Sulfamethoxazole-Trimethoprim Other (See Comments)    Unknown reaction. Pt states that she was told by her mother that she had allergy to Bactrim as a child.    Medications: I have reviewed the patient's current medications.  Results for orders  placed or performed during the hospital encounter of 09/22/17 (from the past 48 hour(s))  CBC with Differential/Platelet     Status: Abnormal   Collection Time: 09/22/17  1:27 AM  Result Value Ref Range   WBC 11.9 (H) 4.0 - 10.5 K/uL   RBC 3.46 (L) 3.87 - 5.11 MIL/uL   Hemoglobin 9.7 (L) 12.0 - 15.0 g/dL   HCT 31.2 (L) 36.0 - 46.0 %   MCV 90.2 78.0 - 100.0 fL   MCH 28.0 26.0 - 34.0 pg   MCHC 31.1 30.0 - 36.0 g/dL   RDW 15.9 (H) 11.5 - 15.5 %   Platelets 332 150 - 400 K/uL   Neutrophils Relative % 82 %   Neutro Abs 9.7 (H) 1.7 - 7.7 K/uL   Lymphocytes Relative 9 %   Lymphs Abs 1.0 0.7 - 4.0  K/uL   Monocytes Relative 7 %   Monocytes Absolute 0.9 0.1 - 1.0 K/uL   Eosinophils Relative 2 %   Eosinophils Absolute 0.3 0.0 - 0.7 K/uL   Basophils Relative 0 %   Basophils Absolute 0.0 0.0 - 0.1 K/uL    Comment: Performed at [facility], [place] [address] [place], [place] [address]  Comprehensive metabolic panel     Status: Abnormal   Collection Time: 09/22/17  1:27 AM  Result Value Ref Range   Sodium 135 135 - 145 mmol/L   Potassium 4.8 3.5 - 5.1 mmol/L   Chloride 95 (L) 101 - 111 mmol/L   CO2 21 (L) 22 - 32 mmol/L   Glucose, Bld 599 (HH) 65 - 99 mg/dL    Comment: CRITICAL RESULT CALLED TO, READ BACK BY AND VERIFIED WITH: E COGGINS,RN 09/22/17 0223 RHOLMES    BUN 68 (H) 6 - 20 mg/dL   Creatinine, Ser 7.50 (H) 0.44 - 1.00 mg/dL   Calcium 7.9 (L) 8.9 - 10.3 mg/dL   Total Protein 5.7 (L) 6.5 - 8.1 g/dL   Albumin 1.8 (L) 3.5 - 5.0 g/dL   AST 16 15 - 41 U/L   ALT 10 (L) 14 - 54 U/L   Alkaline Phosphatase 98 38 - 126 U/L   Total Bilirubin 0.3 0.3 - 1.2 mg/dL   GFR calc non Af Amer 6 (L) >60 mL/min   GFR calc Af Amer 7 (L) >60 mL/min    Comment: (NOTE) The eGFR has been calculated using the CKD EPI equation. This calculation has not been validated in all clinical situations. eGFR's persistently <60 mL/min signify possible Chronic Kidney Disease.    Anion gap 19 (H)  5 - 15    Comment: Performed at [facility], [place] [address] [place], [place] [address]  I-Stat Chem 8, ED     Status: Abnormal   Collection Time: 09/22/17  1:35 AM  Result Value Ref Range   Sodium 133 (L) 135 - 145 mmol/L   Potassium 4.6 3.5 - 5.1 mmol/L   Chloride 96 (L) 101 - 111 mmol/L   BUN 58 (H) 6 - 20 mg/dL   Creatinine, Ser 7.50 (H) 0.44 - 1.00 mg/dL   Glucose, Bld 583 (HH) 65 - 99 mg/dL   Calcium, Ion 0.97 (L) 1.15 - 1.40 mmol/L   TCO2 23 22 - 32 mmol/L   Hemoglobin 10.2 (L) 12.0 - 15.0 g/dL   HCT 30.0 (L) 36.0 - 46.0 %   Comment NOTIFIED PHYSICIAN   CBG monitoring, ED     Status: Abnormal   Collection Time: 09/22/17  3:03 AM  Result Value Ref Range   Glucose-Capillary 543 (HH) 65 - 99 mg/dL  CBG monitoring, ED     Status: Abnormal   Collection Time: 09/22/17  4:19 AM  Result Value Ref Range   Glucose-Capillary 503 (HH) 65 - 99 mg/dL  CBG monitoring, ED     Status: Abnormal   Collection Time: 09/22/17  5:25 AM  Result Value Ref Range   Glucose-Capillary 438 (H) 65 - 99 mg/dL  Basic metabolic panel     Status: Abnormal   Collection Time: 09/22/17  5:26 AM  Result Value Ref Range   Sodium 138 135 - 145 mmol/L   Potassium 4.3 3.5 - 5.1 mmol/L   Chloride 97 (L) 101 - 111 mmol/L   CO2 23 22 - 32 mmol/L   Glucose, Bld 456 (H) 65 - 99 mg/dL  BUN 70 (H) 6 - 20 mg/dL   Creatinine, Ser 7.70 (H) 0.44 - 1.00 mg/dL   Calcium 7.8 (L) 8.9 - 10.3 mg/dL   GFR calc non Af Amer 6 (L) >60 mL/min   GFR calc Af Amer 7 (L) >60 mL/min    Comment: (NOTE) The eGFR has been calculated using the CKD EPI equation. This calculation has not been validated in all clinical situations. eGFR's persistently <60 mL/min signify possible Chronic Kidney Disease.    Anion gap 18 (H) 5 - 15    Comment: Performed at [facility], [place] [address], [place], [place] [address]  CBC     Status: Abnormal   Collection Time: 09/22/17  5:26 AM  Result Value Ref Range    WBC 10.5 4.0 - 10.5 K/uL   RBC 3.30 (L) 3.87 - 5.11 MIL/uL   Hemoglobin 9.4 (L) 12.0 - 15.0 g/dL   HCT 29.3 (L) 36.0 - 46.0 %   MCV 88.8 78.0 - 100.0 fL   MCH 28.5 26.0 - 34.0 pg   MCHC 32.1 30.0 - 36.0 g/dL   RDW 15.8 (H) 11.5 - 15.5 %   Platelets 293 150 - 400 K/uL    Comment: Performed at [facility], [place] [address] [place], [place] [address]  Hemoglobin A1c     Status: Abnormal   Collection Time: 09/22/17  5:26 AM  Result Value Ref Range   Hgb A1c MFr Bld 12.0 (H) 4.8 - 5.6 %    Comment: (NOTE) Pre diabetes:          5.7%-6.4% Diabetes:              >6.4% Glycemic control for   <7.0% adults with diabetes    Mean Plasma Glucose 297.7 mg/dL    Comment: Performed at [place] [address] [place], [place] [address]  CBG monitoring, ED     Status: Abnormal   Collection Time: 09/22/17  6:32 AM  Result Value Ref Range   Glucose-Capillary 344 (H) 65 - 99 mg/dL  CBG monitoring, ED     Status: Abnormal   Collection Time: 09/22/17  7:36 AM  Result Value Ref Range   Glucose-Capillary 266 (H) 65 - 99 mg/dL  hCG, serum, qualitative     Status: None   Collection Time: 09/22/17  8:04 AM  Result Value Ref Range   Preg, Serum NEGATIVE NEGATIVE    Comment:        THE SENSITIVITY OF THIS METHODOLOGY IS >10 mIU/mL. Performed at [facility], [place] [address] [place], [place] [address]   CBG monitoring, ED     Status: Abnormal   Collection Time: 09/22/17  8:37 AM  Result Value Ref Range   Glucose-Capillary 230 (H) 65 - 99 mg/dL  CBG monitoring, ED     Status: Abnormal   Collection Time: 09/22/17  9:41 AM  Result Value Ref Range   Glucose-Capillary 199 (H) 65 - 99 mg/dL  Basic metabolic panel     Status: Abnormal   Collection Time: 09/22/17 10:30 AM  Result Value Ref Range   Sodium 141 135 - 145 mmol/L    Comment: REPEATED TO VERIFY   Potassium 4.6 3.5 - 5.1 mmol/L   Chloride 98 (L) 101 - 111 mmol/L    Comment: REPEATED TO  VERIFY   CO2 21 (L) 22 - 32 mmol/L    Comment: REPEATED TO VERIFY   Glucose, Bld 178 (H) 65 - 99  mg/dL   BUN 67 (H) 6 - 20 mg/dL   Creatinine, Ser 7.55 (H) 0.44 - 1.00 mg/dL   Calcium 7.7 (L) 8.9 - 10.3 mg/dL   GFR calc non Af Amer 6 (L) >60 mL/min   GFR calc Af Amer 7 (L) >60 mL/min    Comment: (NOTE) The eGFR has been calculated using the CKD EPI equation. This calculation has not been validated in all clinical situations. eGFR's persistently <60 mL/min signify possible Chronic Kidney Disease.    Anion gap 22 (H) 5 - 15    Comment: REPEATED TO VERIFY Performed at [facility], [place] [address], [place], [place] [address]   CBG monitoring, ED     Status: Abnormal   Collection Time: 09/22/17 10:57 AM  Result Value Ref Range   Glucose-Capillary 135 (H) 65 - 99 mg/dL  CBG monitoring, ED     Status: Abnormal   Collection Time: 09/22/17 11:41 AM  Result Value Ref Range   Glucose-Capillary 115 (H) 65 - 99 mg/dL   Comment 1 Notify RN    Comment 2 Document in Chart   CBG monitoring, ED     Status: None   Collection Time: 09/22/17  1:12 PM  Result Value Ref Range   Glucose-Capillary 82 65 - 99 mg/dL  CBG monitoring, ED     Status: None   Collection Time: 09/22/17  2:15 PM  Result Value Ref Range   Glucose-Capillary 72 65 - 99 mg/dL  Basic metabolic panel     Status: Abnormal   Collection Time: 09/22/17  2:18 PM  Result Value Ref Range   Sodium 141 135 - 145 mmol/L   Potassium 4.6 3.5 - 5.1 mmol/L   Chloride 103 101 - 111 mmol/L   CO2 20 (L) 22 - 32 mmol/L   Glucose, Bld 83 65 - 99 mg/dL   BUN 65 (H) 6 - 20 mg/dL   Creatinine, Ser 6.90 (H) 0.44 - 1.00 mg/dL   Calcium 7.0 (L) 8.9 - 10.3 mg/dL   GFR calc non Af Amer 7 (L) >60 mL/min   GFR calc Af Amer 8 (L) >60 mL/min    Comment: (NOTE) The eGFR has been calculated using the CKD EPI equation. This calculation has not been validated in all clinical situations. eGFR's persistently <60 mL/min signify possible  Chronic Kidney Disease.    Anion gap 18 (H) 5 - 15    Comment: Performed at [facility], [place] [address] [place], [place] [address]  CBG monitoring, ED     Status: None   Collection Time: 09/22/17  3:23 PM  Result Value Ref Range   Glucose-Capillary 93 65 - 99 mg/dL  CBG monitoring, ED     Status: Abnormal   Collection Time: 09/22/17  5:12 PM  Result Value Ref Range   Glucose-Capillary 148 (H) 65 - 99 mg/dL  CBG monitoring, ED     Status: Abnormal   Collection Time: 09/22/17  6:38 PM  Result Value Ref Range   Glucose-Capillary 155 (H) 65 - 99 mg/dL    Ct Pelvis W Contrast  Result Date: 09/22/2017 CLINICAL DATA:  Initial evaluation for pelvic abscess. EXAM: CT PELVIS WITH CONTRAST TECHNIQUE: Multidetector CT imaging of the pelvis was performed using the standard protocol following the bolus administration of intravenous contrast. CONTRAST:  54m ISOVUE-300 IOPAMIDOL (ISOVUE-300) INJECTION 61% COMPARISON:  Prior CT from 06/11/2017. FINDINGS: Urinary Tract: No obvious hydroureter. Bladder largely decompressed. Diffuse circumferential bladder wall thickening likely related incomplete  distension. Bowel: No acute abnormality seen about the visualized bowels. Bowels are somewhat centralized secondary to large volume ascites. Vascular/Lymphatic: Extensive atherosclerotic change seen throughout the visualized vasculature. No obvious vascular abnormality. Prominent lymph nodes noted within the bilateral inguinal regions, measuring up to 13 mm on the right and 17 mm on the left. No other discernible adenopathy. Reproductive: Extensive vascular calcifications about the uterus and ovaries. Otherwise unremarkable. Other: Large volume ascites within the visualized abdomen and pelvis. No free intraperitoneal air. Musculoskeletal: Diffuse anasarca. There is a more focal area of soft tissue density within the subcutaneous fat at the inferior left gluteal region measuring approximately 3.5 x  4.1 x 2.6 cm, suspicious for phlegmon and/or early abscess (series 3, image 141). No other discrete collections identified. Sacral decubitus ulcer with the erosion of the underlying distal sacrum/coccyx noted. No loculated collection within this region. No other acute osseous abnormality. No worrisome lytic or blastic osseous lesions. Probable superimposed injection sites noted within the subcutaneous fat of the anterior abdomen. IMPRESSION: 1. 3.5 x 4.1 x 2.6 cm soft tissue lesion within the subcutaneous fat of the inferior left gluteal region, suspicious for phlegmon and/or early abscess. Correlation with physical exam recommended. 2. Sacral decubitus ulcer with erosive changes within the underlying sacrum/coccyx. 3. Large volume ascites with diffuse anasarca. 4. Prominent bilateral inguinal adenopathy as above, nonspecific, but may be reactive in nature. Electronically Signed   By: Jeannine Boga M.D.   On: 09/22/2017 03:32   Dg Foot Complete Left  Result Date: 09/22/2017 CLINICAL DATA:  Blood blister on the left heel.  Diabetic. EXAM: LEFT FOOT - COMPLETE 3+ VIEW COMPARISON:  03/24/2017 FINDINGS: Old healed fracture deformity of the distal left fifth metatarsal bone. No evidence of acute fracture or dislocation of the left foot. No focal bone lesion. No cortical erosion or sclerosis. Prominent vascular calcifications. No radiopaque soft tissue foreign bodies. IMPRESSION: No acute bony abnormalities. No evidence to suggest osteomyelitis. Vascular calcifications. Electronically Signed   By: Lucienne Capers M.D.   On: 09/22/2017 02:35    Review of Systems  Constitutional: Positive for diaphoresis. Negative for chills and fever.  HENT: Negative.   Eyes: Negative.   Respiratory: Positive for shortness of breath.   Cardiovascular: Negative.   Gastrointestinal: Negative.   Genitourinary: Negative.   Musculoskeletal: Negative.   Skin: Negative.   Neurological: Negative.   Endo/Heme/Allergies:  Negative.   Psychiatric/Behavioral: Negative.    Blood pressure (!) 147/65, pulse 74, temperature 97.9 F (36.6 C), temperature source Oral, resp. rate (!) 22, last menstrual period 10/09/2015, SpO2 96 %. Physical Exam  Constitutional: She is oriented to person, place, and time. She appears well-developed and well-nourished. No distress.  HENT:  Head: Normocephalic and atraumatic.  Right Ear: External ear normal.  Left Ear: External ear normal.  Eyes: Pupils are equal, round, and reactive to light. Conjunctivae are normal. No scleral icterus.  Neck: Normal range of motion. Neck supple. No tracheal deviation present. No thyromegaly present.  Cardiovascular: Normal rate and regular rhythm.  Murmur (systolic grade III) heard. Respiratory: Effort normal. No respiratory distress. She has wheezes.  Scattered rhonchi  GI: Soft. She exhibits distension. She exhibits no mass. There is no tenderness. There is no rebound and no guarding.  Large fluid wave  Musculoskeletal: She exhibits deformity.  Right BKA Left heel dressing for decubitus  Neurological: She is alert and oriented to person, place, and time.  Skin: She is not diaphoretic.  Dressing over sacral decubitus intact Approx 4  cm area lower left buttock above the gluteal crease with mild erythema, slight fluctuence, but soft; mildly tender; no drainage.  Psychiatric: She has a normal mood and affect. Her behavior is normal.    Assessment/Plan: Cellulitis with likely developing 4 cm abscess left buttock  Agree with IV abx's  Awaiting transfer to [facility] for HD tonight  Will likely require paracentesis for ascites secondary to CHF  Will monitor "abscess" left buttock overnight - if more fluctuent in AM, will be able to drain at bedside under local anesthetic - discussed with patient who agrees.  Armandina Gemma, [facility]: [place] 09/22/2017, 8:39 PM

## 2017-09-22 NOTE — ED Notes (Signed)
Pt placed on 2L O2 via [place] due to SPO2 saturation of 88% on room air. On oxygen pt SPO2 increased to 95%

## 2017-09-23 ENCOUNTER — Encounter (HOSPITAL_COMMUNITY)
Admission: EM | Disposition: A | Discharge status: Home Health | Payer: Self-pay | Source: Home / Self Care | Attending: Internal Medicine

## 2017-09-23 LAB — CBC
HEMATOCRIT: 26.5 % — AB (ref 36.0–46.0)
HEMOGLOBIN: 8.4 g/dL — AB (ref 12.0–15.0)
MCH: 28.2 pg (ref 26.0–34.0)
MCHC: 31.7 g/dL (ref 30.0–36.0)
MCV: 88.9 fL (ref 78.0–100.0)
Platelets: 297 10*3/uL (ref 150–400)
RBC: 2.98 MIL/uL — AB (ref 3.87–5.11)
RDW: 15.9 % — ABNORMAL HIGH (ref 11.5–15.5)
WBC: 11.3 10*3/uL — AB (ref 4.0–10.5)

## 2017-09-23 LAB — RENAL FUNCTION PANEL
ALBUMIN: 1.4 g/dL — AB (ref 3.5–5.0)
Anion gap: 20 — ABNORMAL HIGH (ref 5–15)
BUN: 76 mg/dL — ABNORMAL HIGH (ref 6–20)
CHLORIDE: 93 mmol/L — AB (ref 101–111)
CO2: 21 mmol/L — ABNORMAL LOW (ref 22–32)
Calcium: 7.5 mg/dL — ABNORMAL LOW (ref 8.9–10.3)
Creatinine, Ser: 8.72 mg/dL — ABNORMAL HIGH (ref 0.44–1.00)
GFR, EST AFRICAN AMERICAN: 6 mL/min — AB (ref >60)
GFR, EST NON AFRICAN AMERICAN: 5 mL/min — AB (ref >60)
Glucose, Bld: 371 mg/dL — ABNORMAL HIGH (ref 65–99)
PHOSPHORUS: 9.2 mg/dL — AB (ref 2.5–4.6)
POTASSIUM: 5.5 mmol/L — AB (ref 3.5–5.1)
Sodium: 134 mmol/L — ABNORMAL LOW (ref 135–145)

## 2017-09-23 LAB — GLUCOSE, CAPILLARY
GLUCOSE-CAPILLARY: 174 mg/dL — AB (ref 65–99)
GLUCOSE-CAPILLARY: 321 mg/dL — AB (ref 65–99)
GLUCOSE-CAPILLARY: 327 mg/dL — AB (ref 65–99)
Glucose-Capillary: 328 mg/dL — ABNORMAL HIGH (ref 65–99)
Glucose-Capillary: 319 mg/dL — ABNORMAL HIGH (ref 65–99)

## 2017-09-23 LAB — MRSA PCR SCREENING: MRSA by PCR: NEGATIVE

## 2017-09-23 SURGERY — IRRIGATION AND DEBRIDEMENT BUTTOCKS
Anesthesia: General

## 2017-09-23 MED ORDER — HYDRALAZINE HCL 20 MG/ML IJ SOLN: 20.0000 mg | INTRAMUSCULAR | Status: DC | PRN

## 2017-09-23 MED ORDER — VANCOMYCIN HCL IN DEXTROSE 1-5 GM/200ML-% IV SOLN
1000.0000 mg | INTRAVENOUS | Status: DC
  Administered 2017-09-24: 1000 mg via INTRAVENOUS
  Filled 2017-09-23: qty 200

## 2017-09-23 MED ORDER — SODIUM CHLORIDE 0.9 % IV SOLN: 100.0000 mL | INTRAVENOUS | Status: DC | PRN

## 2017-09-23 MED ORDER — LIDOCAINE HCL (PF) 2 % IJ SOLN
10.0000 mL | Freq: Once | INTRAMUSCULAR | Status: DC | PRN
  Filled 2017-09-23: qty 10

## 2017-09-23 MED ORDER — PENTAFLUOROPROP-TETRAFLUOROETH EX AERO: 1.0000 "application " | INHALATION_SPRAY | CUTANEOUS | Status: DC | PRN

## 2017-09-23 MED ORDER — INSULIN ASPART 100 UNIT/ML ~~LOC~~ SOLN
0.0000 [IU] | Freq: Three times a day (TID) | SUBCUTANEOUS | Status: DC
  Administered 2017-09-23: 11 [IU] via SUBCUTANEOUS
  Administered 2017-09-24: 2 [IU] via SUBCUTANEOUS
  Administered 2017-09-24: 5 [IU] via SUBCUTANEOUS
  Administered 2017-09-24 – 2017-09-25 (×3): 8 [IU] via SUBCUTANEOUS
  Administered 2017-09-25: 11 [IU] via SUBCUTANEOUS
  Administered 2017-09-26: 5 [IU] via SUBCUTANEOUS

## 2017-09-23 MED ORDER — LIDOCAINE-PRILOCAINE 2.5-2.5 % EX CREA: 1.0000 "application " | TOPICAL_CREAM | CUTANEOUS | Status: DC | PRN

## 2017-09-23 MED ORDER — HEPARIN SODIUM (PORCINE) 5000 UNIT/ML IJ SOLN
5000.0000 [IU] | Freq: Three times a day (TID) | INTRAMUSCULAR | Status: DC
  Filled 2017-09-23 (×4): qty 1

## 2017-09-23 MED ORDER — INSULIN GLARGINE 100 UNIT/ML ~~LOC~~ SOLN
15.0000 [IU] | Freq: Once | SUBCUTANEOUS | Status: DC
  Filled 2017-09-23: qty 0.15

## 2017-09-23 MED ORDER — INSULIN GLARGINE 100 UNIT/ML ~~LOC~~ SOLN
25.0000 [IU] | Freq: Every day | SUBCUTANEOUS | Status: DC
  Administered 2017-09-24 – 2017-09-25 (×2): 25 [IU] via SUBCUTANEOUS
  Filled 2017-09-23 (×2): qty 0.25

## 2017-09-23 MED ORDER — VANCOMYCIN HCL IN DEXTROSE 1-5 GM/200ML-% IV SOLN
1000.0000 mg | INTRAVENOUS | Status: AC
  Administered 2017-09-23: 1000 mg via INTRAVENOUS
  Filled 2017-09-23: qty 200

## 2017-09-23 NOTE — Progress Notes (Signed)
PROGRESS NOTE    [name]  VWU:[id number] DOB: Apr 10, 1976 DOA: 09/22/2017 PCP: Merrilee Seashore, MD   Brief Narrative: Patient is a 42 year old female with past medical history of end  stage renal disease on dialysis MWF, CHF with ascites, diabetes mellitus type 2, anemia, hypertension who also had a cardiac arrest in November 2018 during vascular procedure presented to the emergency department for worsening pain and swelling on her left buttock area.  Patient also had developed ulceration on her left heel.  Patient also has a chronic sacral  decubitus ulcer.  CT scan on presentation showed concerning for phlegmon versus early abscess on the left buttock .She was also found to have. hyperglycemia with anion gap of 19.  She was admitted for further management of left buttock abscess, DKA and ongoing dialysis.  Assessment & Plan:   Principal Problem:   Cellulitis and abscess of buttock Active Problems:   Essential hypertension, benign   End stage renal disease on dialysis ([place])   PAD (peripheral artery disease) (HCC)   Uncontrolled diabetes mellitus with end-stage renal disease (HCC)   Stage IV pressure ulcer of sacral region ([place])   Anemia of chronic disease   DKA (diabetic ketoacidoses) (HCC)   Cellulitis of the left buttock with possible abscess/left heel ulceration: Continue empiric antibiotics for now.  On Vanco and Zosyn now .surgery consultation done.surgery planning for bedside I&D today. We will follow-up cultures.  She is afebrile.  White cell counts slightly elevated. Requested for wound care evaluation.  Uncontrolled diabetes mellitus type 2/DKA on presentation: Started on insulin drip on presentation.  It has been stopped.  Currently on sliding scale insulin and Lantus which we will continue.  ESRD on dialysis: Nephrology following.  She was hyperkalemic this morning .Dialyzed today.  Congestive heart failure: Last echocardiogram done in November 2018 showed grade  2 diastolic dysfunction.  She has ascites due to her CHF and undergoes regular paracentesis.  Abdomen found to be distended today.  Will order paracentesis.  Hypertension: BP elevated .  She is on amlodipine, clonidine, hydralazine, Imdur, labetalol at home which we will continue.Continue PRN meds  Anemia: Secondary to ESRD.  We will continue to monitor CBC.  History of peripheral artery disease: Status post right BKA.  Continue  Lipitor.   DVT prophylaxis: HeparinSC Code Status: Full Family Communication: None present at the bedside Disposition Plan: Home after resolution of cellulitis   Consultants: Neurologist, surgery  Procedures: Dialysis  Antimicrobials: Vancomycin and Zosyn since 09/02/17  Subjective: Patient seen and examined the bedside this morning.  Remains comfortable.  Afebrile.  Denies any complaints at present.  Objective: Vitals:   09/23/17 1130 09/23/17 1200 09/23/17 1227 09/23/17 1253  BP: (!) 162/79 (!) 166/75 (!) 170/77 (!) 177/66  Pulse: 80 78 78 79  Resp:   20 18  Temp:   98 F (36.7 C) 98.7 F (37.1 C)  TempSrc:   Oral Oral  SpO2:   95% 91%  Weight:   84.6 kg (186 lb 8.2 oz)   Height:        Intake/Output Summary (Last 24 hours) at 09/23/2017 1451 Last data filed at 09/23/2017 1227 Gross per 24 hour  Intake 262.69 ml  Output 4500 ml  Net -4237.31 ml   Filed Weights   09/23/17 0008 09/23/17 0812 09/23/17 1227  Weight: 89 kg (196 lb 3.4 oz) 88.8 kg (195 lb 12.3 oz) 84.6 kg (186 lb 8.2 oz)    Examination:  General exam: Appears calm and  comfortable ,Not in distress,average built HEENT:PERRL,Oral mucosa moist, Ear/Nose normal on gross exam Respiratory system: Bilateral equal air entry, normal vesicular breath sounds, no wheezes or crackles  Cardiovascular system: S1 & S2 heard, RRR. No JVD, murmurs, rubs, gallops or clicks. No pedal edema. Gastrointestinal system: Abdomen is distended, ascites,soft and nontender.  Normal bowel sounds  heard. Central nervous system: Alert and oriented. No focal neurological deficits. Extremities: No edema, no clubbing ,no cyanosis, distal peripheral pulses palpable.Ulcer on the left heel,Right BKA,left tender nodule on the shin AV fistula on the left arm Skin: No rashes, lesions,no icterus ,no pallor,Sacral decubitus ulcer  Data Reviewed: I have personally reviewed following labs and imaging studies  CBC: Recent Labs  Lab 09/22/17 0127 09/22/17 0135 09/22/17 0526 09/23/17 0830  WBC 11.9*  --  10.5 11.3*  NEUTROABS 9.7*  --   --   --   HGB 9.7* 10.2* 9.4* 8.4*  HCT 31.2* 30.0* 29.3* 26.5*  MCV 90.2  --  88.8 88.9  PLT 332  --  293 376   Basic Metabolic Panel: Recent Labs  Lab 09/22/17 0526 09/22/17 1030 09/22/17 1418 09/22/17 2048 09/23/17 0830  NA 138 141 141 137 134*  K 4.3 4.6 4.6 5.8* 5.5*  CL 97* 98* 103 98* 93*  CO2 23 21* 20* 20* 21*  GLUCOSE 456* 178* 83 180* 371*  BUN 70* 67* 65* 78* 76*  CREATININE 7.70* 7.55* 6.90* 8.69* 8.72*  CALCIUM 7.8* 7.7* 7.0* 7.8* 7.5*  PHOS  --   --   --   --  9.2*   GFR: Estimated Creatinine Clearance: 11 mL/min (A) (by C-G formula based on SCr of 8.72 mg/dL (H)). Liver Function Tests: Recent Labs  Lab 09/22/17 0127 09/23/17 0830  AST 16  --   ALT 10*  --   ALKPHOS 98  --   BILITOT 0.3  --   PROT 5.7*  --   ALBUMIN 1.8* 1.4*   No results for input(s): LIPASE, AMYLASE in the last 168 hours. No results for input(s): AMMONIA in the last 168 hours. Coagulation Profile: No results for input(s): INR, PROTIME in the last 168 hours. Cardiac Enzymes: No results for input(s): CKTOTAL, CKMB, CKMBINDEX, TROPONINI in the last 168 hours. BNP (last 3 results) No results for input(s): PROBNP in the last 8760 hours. HbA1C: Recent Labs    09/22/17 0526  HGBA1C 12.0*   CBG: Recent Labs  Lab 09/22/17 2046 09/22/17 2215 09/23/17 0009 09/23/17 0719 09/23/17 1251  GLUCAP 158* 186* 174* 328* 327*   Lipid Profile: No results  for input(s): CHOL, HDL, LDLCALC, TRIG, CHOLHDL, LDLDIRECT in the last 72 hours. Thyroid Function Tests: No results for input(s): TSH, T4TOTAL, FREET4, T3FREE, THYROIDAB in the last 72 hours. Anemia Panel: No results for input(s): VITAMINB12, FOLATE, FERRITIN, TIBC, IRON, RETICCTPCT in the last 72 hours. Sepsis Labs: No results for input(s): PROCALCITON, LATICACIDVEN in the last 168 hours.  Recent Results (from the past 240 hour(s))  Blood culture (routine x 2)     Status: None (Preliminary result)   Collection Time: 09/22/17  1:26 AM  Result Value Ref Range Status   Specimen Description   Final    BLOOD RIGHT HAND Performed at [facility] [address], [place], [place] [address]    Special Requests   Final    BOTTLES DRAWN AEROBIC AND ANAEROBIC Blood Culture adequate volume Performed at [facility] [address], [place], [place] [address]    Culture  Final    NO GROWTH 1 DAY Performed at [facility], [place] [address] [place], [place] [address]    Report Status PENDING  Incomplete  Blood culture (routine x 2)     Status: None (Preliminary result)   Collection Time: 09/22/17  1:38 AM  Result Value Ref Range Status   Specimen Description   Final    BLOOD RIGHT ANTECUBITAL Performed at [facility] [address], [place], [place] [address]    Special Requests   Final    BOTTLES DRAWN AEROBIC AND ANAEROBIC Blood Culture results may not be optimal due to an excessive volume of blood received in culture bottles Performed at [facility] [address], [place], [place] [address]    Culture   Final    NO GROWTH 1 DAY Performed at [facility], [place] [address] [place], [place] [address]    Report Status PENDING  Incomplete  MRSA PCR Screening     Status: None   Collection Time: 09/23/17  1:10 AM  Result Value Ref Range Status   MRSA by PCR NEGATIVE NEGATIVE Final    Comment:         The GeneXpert MRSA Assay (FDA approved for NASAL specimens only), is one component of a comprehensive MRSA colonization surveillance program. It is not intended to diagnose MRSA infection nor to guide or monitor treatment for MRSA infections. Performed at [facility], [place] [address] [place], [place] [address]          Radiology Studies: Ct Pelvis W Contrast  Result Date: 09/22/2017 CLINICAL DATA:  Initial evaluation for pelvic abscess. EXAM: CT PELVIS WITH CONTRAST TECHNIQUE: Multidetector CT imaging of the pelvis was performed using the standard protocol following the bolus administration of intravenous contrast. CONTRAST:  67mL ISOVUE-300 IOPAMIDOL (ISOVUE-300) INJECTION 61% COMPARISON:  Prior CT from 06/11/2017. FINDINGS: Urinary Tract: No obvious hydroureter. Bladder largely decompressed. Diffuse circumferential bladder wall thickening likely related incomplete distension. Bowel: No acute abnormality seen about the visualized bowels. Bowels are somewhat centralized secondary to large volume ascites. Vascular/Lymphatic: Extensive atherosclerotic change seen throughout the visualized vasculature. No obvious vascular abnormality. Prominent lymph nodes noted within the bilateral inguinal regions, measuring up to 13 mm on the right and 17 mm on the left. No other discernible adenopathy. Reproductive: Extensive vascular calcifications about the uterus and ovaries. Otherwise unremarkable. Other: Large volume ascites within the visualized abdomen and pelvis. No free intraperitoneal air. Musculoskeletal: Diffuse anasarca. There is a more focal area of soft tissue density within the subcutaneous fat at the inferior left gluteal region measuring approximately 3.5 x 4.1 x 2.6 cm, suspicious for phlegmon and/or early abscess (series 3, image 141). No other discrete collections identified. Sacral decubitus ulcer with the erosion of the underlying distal sacrum/coccyx noted. No loculated  collection within this region. No other acute osseous abnormality. No worrisome lytic or blastic osseous lesions. Probable superimposed injection sites noted within the subcutaneous fat of the anterior abdomen. IMPRESSION: 1. 3.5 x 4.1 x 2.6 cm soft tissue lesion within the subcutaneous fat of the inferior left gluteal region, suspicious for phlegmon and/or early abscess. Correlation with physical exam recommended. 2. Sacral decubitus ulcer with erosive changes within the underlying sacrum/coccyx. 3. Large volume ascites with diffuse anasarca. 4. Prominent bilateral inguinal adenopathy as above, nonspecific, but may be reactive in nature. Electronically Signed   By: Jeannine Boga M.D.   On: 09/22/2017 03:32   Dg Foot Complete Left  Result Date: 09/22/2017 CLINICAL DATA:  Blood blister on the left heel.  Diabetic. EXAM: LEFT FOOT - COMPLETE 3+ VIEW COMPARISON:  03/24/2017 FINDINGS: Old healed fracture deformity of the distal left fifth metatarsal bone. No evidence of acute fracture or dislocation of the left foot. No focal bone lesion. No cortical erosion or sclerosis. Prominent vascular calcifications. No radiopaque soft tissue foreign bodies. IMPRESSION: No acute bony abnormalities. No evidence to suggest osteomyelitis. Vascular calcifications. Electronically Signed   By: Lucienne Capers M.D.   On: 09/22/2017 02:35        Scheduled Meds: . amLODipine  10 mg Oral QPC supper  . atorvastatin  40 mg Oral q1800  . cinacalcet  60 mg Oral Q supper  . cloNIDine  0.3 mg Oral TID  . ferric citrate  420 mg Oral TID WC  . gabapentin  100 mg Oral BID  . hydrALAZINE  100 mg Oral Q8H  . insulin aspart  0-5 Units Subcutaneous QHS  . insulin aspart  0-9 Units Subcutaneous TID WC  . insulin glargine  10 Units Subcutaneous Daily  . isosorbide mononitrate  120 mg Oral Once per day on Sun Tue Thu Sat  . labetalol  300 mg Oral TID  . metoCLOPramide  2.5 mg Oral TID AC  . multivitamin  1 tablet Oral QHS   . pantoprazole  40 mg Oral Daily  . sertraline  50 mg Oral Daily  . sevelamer carbonate  2,400 mg Oral TID WC   Continuous Infusions: . sodium chloride    . sodium chloride Stopped (09/22/17 1408)  . piperacillin-tazobactam (ZOSYN)  IV 3.375 g (09/23/17 1308)  . [START ON 09/24/2017] vancomycin    . vancomycin       LOS: 1 day    Time spent: 25 mins.More than 50% of that time was spent in counseling and/or coordination of care.      Shelly Coss, MD [facility] Pager [phone]  If 7PM-7AM, please contact night-coverage [email or web address] Password [email or web address] 09/23/2017, 2:51 PM

## 2017-09-23 NOTE — Progress Notes (Signed)
[facility] Progress Note     Subjective: CC: L gluteal abscess Patient states L gluteal abscess tender and has gotten significantly larger over the last several days. Having BMs, not anuric but only makes very little urine still. Patient going for paracentesis and dialysis this AM. VSS.   Objective: Vital signs in last 24 hours: Temp:  [98.3 F (36.8 C)-98.8 F (37.1 C)] 98.8 F (37.1 C) (04/30 0451) Pulse Rate:  [68-88] 82 (04/30 0451) Resp:  [10-28] 15 (04/29 2320) BP: (137-187)/(65-92) 150/70 (04/30 0451) SpO2:  [88 %-100 %] 90 % (04/30 0451) Weight:  [89 kg (196 lb 3.4 oz)] 89 kg (196 lb 3.4 oz) (04/30 0008)    Intake/Output from previous day: 04/29 0701 - 04/30 0700 In: 655.6 [P.O.:120; I.V.:435.6; IV Piggyback:100] Out: 0  Intake/Output this shift: No intake/output data recorded.  PE: Gen:  Alert, NAD, pleasant Card:  Regular rate and rhythm Pulm:  Normal effort Abd: Soft, non-tender, non-distended GU: area of erythema with induration and central fluctuance at left gluteal cleft, TTP Skin: warm and dry Psych: A&Ox3   Lab Results:  Recent Labs    09/22/17 0127 09/22/17 0135 09/22/17 0526  WBC 11.9*  --  10.5  HGB 9.7* 10.2* 9.4*  HCT 31.2* 30.0* 29.3*  PLT 332  --  293   BMET Recent Labs    09/22/17 1418 09/22/17 2048  NA 141 137  K 4.6 5.8*  CL 103 98*  CO2 20* 20*  GLUCOSE 83 180*  BUN 65* 78*  CREATININE 6.90* 8.69*  CALCIUM 7.0* 7.8*   PT/INR No results for input(s): LABPROT, INR in the last 72 hours. CMP     Component Value Date/Time   NA 137 09/22/2017 2048   K 5.8 (H) 09/22/2017 2048   CL 98 (L) 09/22/2017 2048   CO2 20 (L) 09/22/2017 2048   GLUCOSE 180 (H) 09/22/2017 2048   BUN 78 (H) 09/22/2017 2048   CREATININE 8.69 (H) 09/22/2017 2048   CALCIUM 7.8 (L) 09/22/2017 2048   PROT 5.7 (L) 09/22/2017 0127   ALBUMIN 1.8 (L) 09/22/2017 0127   AST 16 09/22/2017 0127   ALT 10 (L) 09/22/2017 0127   ALKPHOS 98 09/22/2017  0127   BILITOT 0.3 09/22/2017 0127   GFRNONAA 5 (L) 09/22/2017 2048   GFRAA 6 (L) 09/22/2017 2048   Lipase     Component Value Date/Time   LIPASE 30 02/14/2016 1841       Studies/Results: Ct Pelvis W Contrast  Result Date: 09/22/2017 CLINICAL DATA:  Initial evaluation for pelvic abscess. EXAM: CT PELVIS WITH CONTRAST TECHNIQUE: Multidetector CT imaging of the pelvis was performed using the standard protocol following the bolus administration of intravenous contrast. CONTRAST:  61mL ISOVUE-300 IOPAMIDOL (ISOVUE-300) INJECTION 61% COMPARISON:  Prior CT from 06/11/2017. FINDINGS: Urinary Tract: No obvious hydroureter. Bladder largely decompressed. Diffuse circumferential bladder wall thickening likely related incomplete distension. Bowel: No acute abnormality seen about the visualized bowels. Bowels are somewhat centralized secondary to large volume ascites. Vascular/Lymphatic: Extensive atherosclerotic change seen throughout the visualized vasculature. No obvious vascular abnormality. Prominent lymph nodes noted within the bilateral inguinal regions, measuring up to 13 mm on the right and 17 mm on the left. No other discernible adenopathy. Reproductive: Extensive vascular calcifications about the uterus and ovaries. Otherwise unremarkable. Other: Large volume ascites within the visualized abdomen and pelvis. No free intraperitoneal air. Musculoskeletal: Diffuse anasarca. There is a more focal area of soft tissue density within the subcutaneous fat at the inferior  left gluteal region measuring approximately 3.5 x 4.1 x 2.6 cm, suspicious for phlegmon and/or early abscess (series 3, image 141). No other discrete collections identified. Sacral decubitus ulcer with the erosion of the underlying distal sacrum/coccyx noted. No loculated collection within this region. No other acute osseous abnormality. No worrisome lytic or blastic osseous lesions. Probable superimposed injection sites noted within the  subcutaneous fat of the anterior abdomen. IMPRESSION: 1. 3.5 x 4.1 x 2.6 cm soft tissue lesion within the subcutaneous fat of the inferior left gluteal region, suspicious for phlegmon and/or early abscess. Correlation with physical exam recommended. 2. Sacral decubitus ulcer with erosive changes within the underlying sacrum/coccyx. 3. Large volume ascites with diffuse anasarca. 4. Prominent bilateral inguinal adenopathy as above, nonspecific, but may be reactive in nature. Electronically Signed   By: Jeannine Boga M.D.   On: 09/22/2017 03:32   Dg Foot Complete Left  Result Date: 09/22/2017 CLINICAL DATA:  Blood blister on the left heel.  Diabetic. EXAM: LEFT FOOT - COMPLETE 3+ VIEW COMPARISON:  03/24/2017 FINDINGS: Old healed fracture deformity of the distal left fifth metatarsal bone. No evidence of acute fracture or dislocation of the left foot. No focal bone lesion. No cortical erosion or sclerosis. Prominent vascular calcifications. No radiopaque soft tissue foreign bodies. IMPRESSION: No acute bony abnormalities. No evidence to suggest osteomyelitis. Vascular calcifications. Electronically Signed   By: Lucienne Capers M.D.   On: 09/22/2017 02:35    Anti-infectives: Anti-infectives (From admission, onward)   Start     Dose/Rate Route Frequency Ordered Stop   09/22/17 1000  piperacillin-tazobactam (ZOSYN) IVPB 3.375 g     3.375 g 12.5 mL/hr over 240 Minutes Intravenous Every 12 hours 09/22/17 0414     09/22/17 0230  vancomycin (VANCOCIN) 1,500 mg in sodium chloride 0.9 % 500 mL IVPB     1,500 mg 250 mL/hr over 120 Minutes Intravenous  Once 09/22/17 0211 09/22/17 0514   09/22/17 0215  vancomycin (VANCOCIN) IVPB 1000 mg/200 mL premix  Status:  Discontinued     1,000 mg 200 mL/hr over 60 Minutes Intravenous  Once 09/22/17 0200 09/22/17 0211   09/22/17 0215  piperacillin-tazobactam (ZOSYN) IVPB 3.375 g     3.375 g 100 mL/hr over 30 Minutes Intravenous  Once 09/22/17 0208 09/22/17 0252        Assessment/Plan ESRD on HD CHF w/ ascites - paracentesis today  T2DM, not controlled - DKA yesterday, blood glucose improving, A1C 12.0 Anemia of chronic disease - hgb 9.4 yesterday  HTN CAD w/ Hx of cardiac arrest 03/2017 Hx of R BKA 05/2017 Hyperkalemia - K 5.8  Cellulitis with likely developing abscess left buttock - WBC 10.5, afebrile - there is a small area of fluctuance that I think could be opened at the bedside with some local anesthetic - combination of bedside I&D, IV abx and getting control of patient's blood glucose should hopefully take care of this - if patient fails to improve, can consider debridement in the OR later this week  FEN: renal CM diet VTE: SCDs ID: [id number] zosyn 4/30>>  LOS: 1 day    Brigid Re , [facility] Surgery 09/23/2017, 7:27 AM Pager: [phone] Consults: [phone] Mon-Fri 7:00 am-4:30 pm Sat-Sun 7:00 am-11:30 am

## 2017-09-23 NOTE — Progress Notes (Addendum)
[facility] Progress Note  Subjective: no c/o , on hd  Vitals:   09/23/17 1000 09/23/17 1030 09/23/17 1100 09/23/17 1130  BP: (!) 156/69 (!) 159/79 (!) 150/80 (!) 162/79  Pulse: 79 78 78 80  Resp:      Temp:      TempSrc:      SpO2:      Weight:      Height:        Inpatient medications: . amLODipine  10 mg Oral QPC supper  . atorvastatin  40 mg Oral q1800  . cinacalcet  60 mg Oral Q supper  . cloNIDine  0.3 mg Oral TID  . ferric citrate  420 mg Oral TID WC  . gabapentin  100 mg Oral BID  . hydrALAZINE  100 mg Oral Q8H  . insulin aspart  0-5 Units Subcutaneous QHS  . insulin aspart  0-9 Units Subcutaneous TID WC  . insulin glargine  10 Units Subcutaneous Daily  . isosorbide mononitrate  120 mg Oral Once per day on Sun Tue Thu Sat  . labetalol  300 mg Oral TID  . metoCLOPramide  2.5 mg Oral TID AC  . multivitamin  1 tablet Oral QHS  . pantoprazole  40 mg Oral Daily  . sertraline  50 mg Oral Daily  . sevelamer carbonate  2,400 mg Oral TID WC   . sodium chloride    . sodium chloride Stopped (09/22/17 1408)  . sodium chloride    . piperacillin-tazobactam (ZOSYN)  IV Stopped (09/23/17 0230)  . [START ON 09/24/2017] vancomycin    . vancomycin     sodium chloride, acetaminophen **OR** acetaminophen, dextrose, hydrALAZINE, lidocaine, lidocaine-prilocaine, LORazepam, ondansetron **OR** ondansetron (ZOFRAN) IV, pentafluoroprop-tetrafluoroeth  Exam: Gen alert, not in distress, calm No rash, cyanosis or gangrene Sclera anicteric, throat clear No jvd or bruits Chest clear bilat to bases RRR no MRG Abd soft ntnd no mass, 2- 3+ ascites +bs GU defer MS no joint effusions or deformity, R BKA stump Ext no LE or UE edema, left foot and buttock wounds bandaged Neuro is alert, Ox 3 , nf Left forearm avf w bruit    Dialysis: mwf nw 4h 20min  82kg  2/2.5 bath  p2  Heparin none   Left forearm avf -hectorol 1 ug - mircera 100 ug every 2 wks (4/26) - home bp rx>  norvasc 10/clon 0.3 tid/ hydral 100 tid/ labetalol 300 tid    Impression: 1 cellulitis/ early abscess left buttock and left heel - on abx and prob bedside I&D today per gen surg 2 esrd on hd mwf 3 uncont dm2 sp iv insulin, on ssi now 4 volume- chronic ascites up 7kg needs uf on hd and prob paracentesis 5 hypertension severe by hx on 4 bp meds, cont 6 cad on statin/ nitrates 7 chronic pain 8 depression 9 anemia ckd last esa on 4/26 not due until 3/10   Plan - dialysis today off sched and tomorrow to get back on schedule   Kelly Splinter MD [facility] pager [phone]   09/23/2017, 12:01 PM   Recent Labs  Lab 09/22/17 1418 09/22/17 2048 09/23/17 0830  NA 141 137 134*  K 4.6 5.8* 5.5*  CL 103 98* 93*  CO2 20* 20* 21*  GLUCOSE 83 180* 371*  BUN 65* 78* 76*  CREATININE 6.90* 8.69* 8.72*  CALCIUM 7.0* 7.8* 7.5*  PHOS  --   --  9.2*   Recent Labs  Lab 09/22/17 0127 09/23/17 0830  AST 16  --   ALT 10*  --   ALKPHOS 98  --   BILITOT 0.3  --   PROT 5.7*  --   ALBUMIN 1.8* 1.4*   Recent Labs  Lab 09/22/17 0127 09/22/17 0135 09/22/17 0526 09/23/17 0830  WBC 11.9*  --  10.5 11.3*  NEUTROABS 9.7*  --   --   --   HGB 9.7* 10.2* 9.4* 8.4*  HCT 31.2* 30.0* 29.3* 26.5*  MCV 90.2  --  88.8 88.9  PLT 332  --  293 297   Iron/TIBC/Ferritin/ %Sat    Component Value Date/Time   IRON 26 (L) 06/23/2017 0708   TIBC 113 (L) 06/23/2017 0708   FERRITIN 1,050 (H) 06/23/2017 0708   IRONPCTSAT 23 06/23/2017 0708

## 2017-09-23 NOTE — Procedures (Signed)
Incision and Drainage Procedure Note  Pre-operative Diagnosis: Left buttock abscess  Post-operative Diagnosis: same  Indications: left buttock abscess  Anesthesia: 1% plain lidocaine  Procedure Details  The procedure, risks and complications have been discussed in detail (including, but not limited to airway compromise, infection, bleeding) with the patient, and the patient has signed consent to the procedure.  The skin was sterilely prepped and draped over the affected area in the usual fashion. After adequate local anesthesia, I&D with a #11 blade was performed on the left buttock. Abscess was already draining. I increased the size of the opening and probed for loculations. Wound packed with moist gauze.  Purulent drainage: present  Findings: Purulent drainage  The patient was observed until stable.  EBL: 0.5 cc's  Drains: none  Condition: Tolerated procedure well   Complications: none.  Jackson Latino, [facility] Pager [phone]

## 2017-09-24 ENCOUNTER — Inpatient Hospital Stay (HOSPITAL_COMMUNITY): Payer: Medicare Other

## 2017-09-24 ENCOUNTER — Encounter (HOSPITAL_COMMUNITY): Payer: Self-pay | Admitting: Student

## 2017-09-24 DIAGNOSIS — D638 Anemia in other chronic diseases classified elsewhere: Secondary | ICD-10-CM

## 2017-09-24 DIAGNOSIS — Z992 Dependence on renal dialysis: Secondary | ICD-10-CM | POA: Diagnosis not present

## 2017-09-24 DIAGNOSIS — E1110 Type 2 diabetes mellitus with ketoacidosis without coma: Secondary | ICD-10-CM

## 2017-09-24 DIAGNOSIS — N186 End stage renal disease: Secondary | ICD-10-CM | POA: Diagnosis not present

## 2017-09-24 DIAGNOSIS — L03317 Cellulitis of buttock: Principal | ICD-10-CM

## 2017-09-24 DIAGNOSIS — L0231 Cutaneous abscess of buttock: Secondary | ICD-10-CM

## 2017-09-24 DIAGNOSIS — E1129 Type 2 diabetes mellitus with other diabetic kidney complication: Secondary | ICD-10-CM | POA: Diagnosis not present

## 2017-09-24 HISTORY — PX: IR PARACENTESIS: IMG2679

## 2017-09-24 LAB — CBC WITH DIFFERENTIAL/PLATELET
BASOS PCT: 0 %
Basophils Absolute: 0 10*3/uL (ref 0.0–0.1)
EOS ABS: 0.2 10*3/uL (ref 0.0–0.7)
Eosinophils Relative: 3 %
HCT: 27.4 % — ABNORMAL LOW (ref 36.0–46.0)
HEMOGLOBIN: 8.5 g/dL — AB (ref 12.0–15.0)
LYMPHS ABS: 1.2 10*3/uL (ref 0.7–4.0)
Lymphocytes Relative: 13 %
MCH: 27.7 pg (ref 26.0–34.0)
MCHC: 31 g/dL (ref 30.0–36.0)
MCV: 89.3 fL (ref 78.0–100.0)
Monocytes Absolute: 0.8 10*3/uL (ref 0.1–1.0)
Monocytes Relative: 9 %
NEUTROS PCT: 75 %
Neutro Abs: 7.2 10*3/uL (ref 1.7–7.7)
Platelets: 278 10*3/uL (ref 150–400)
RBC: 3.07 MIL/uL — AB (ref 3.87–5.11)
RDW: 16.1 % — ABNORMAL HIGH (ref 11.5–15.5)
WBC: 9.5 10*3/uL (ref 4.0–10.5)

## 2017-09-24 LAB — BASIC METABOLIC PANEL
ANION GAP: 16 — AB (ref 5–15)
BUN: 47 mg/dL — ABNORMAL HIGH (ref 6–20)
CHLORIDE: 97 mmol/L — AB (ref 101–111)
CO2: 23 mmol/L (ref 22–32)
Calcium: 7.1 mg/dL — ABNORMAL LOW (ref 8.9–10.3)
Creatinine, Ser: 5.77 mg/dL — ABNORMAL HIGH (ref 0.44–1.00)
GFR calc non Af Amer: 8 mL/min — ABNORMAL LOW (ref >60)
GFR, EST AFRICAN AMERICAN: 10 mL/min — AB (ref >60)
Glucose, Bld: 288 mg/dL — ABNORMAL HIGH (ref 65–99)
POTASSIUM: 4.8 mmol/L (ref 3.5–5.1)
SODIUM: 136 mmol/L (ref 135–145)

## 2017-09-24 LAB — GLUCOSE, CAPILLARY
GLUCOSE-CAPILLARY: 276 mg/dL — AB (ref 65–99)
Glucose-Capillary: 122 mg/dL — ABNORMAL HIGH (ref 65–99)
Glucose-Capillary: 201 mg/dL — ABNORMAL HIGH (ref 65–99)
Glucose-Capillary: 186 mg/dL — ABNORMAL HIGH (ref 65–99)

## 2017-09-24 MED ORDER — HYDRALAZINE HCL 20 MG/ML IJ SOLN
INTRAMUSCULAR | Status: AC
  Administered 2017-09-24: 20 mg
  Filled 2017-09-24: qty 1

## 2017-09-24 MED ORDER — LIDOCAINE HCL (PF) 2 % IJ SOLN
INTRAMUSCULAR | Status: AC
  Filled 2017-09-24: qty 20

## 2017-09-24 MED ORDER — LIDOCAINE HCL (PF) 1 % IJ SOLN
INTRAMUSCULAR | Status: DC | PRN
  Administered 2017-09-24: 10 mL

## 2017-09-24 MED ORDER — VANCOMYCIN HCL IN DEXTROSE 1-5 GM/200ML-% IV SOLN
INTRAVENOUS | Status: AC
  Administered 2017-09-24: 1000 mg via INTRAVENOUS
  Filled 2017-09-24: qty 200

## 2017-09-24 NOTE — Progress Notes (Signed)
[facility] Progress Note  Subjective: no c/o , had I&D of gluteal abscess yesterday  Vitals:   09/24/17 0825 09/24/17 0841 09/24/17 0846 09/24/17 0930  BP: 124/65 140/62 136/65 (!) 160/75  Pulse: 72 70 70 73  Resp: 19 18 18  (!) 21  Temp: 97.8 F (36.6 C)     TempSrc: Oral     SpO2: 96%     Weight: 86.3 kg (190 lb 4.1 oz)     Height:        Inpatient medications: . amLODipine  10 mg Oral QPC supper  . atorvastatin  40 mg Oral q1800  . cinacalcet  60 mg Oral Q supper  . cloNIDine  0.3 mg Oral TID  . ferric citrate  420 mg Oral TID WC  . gabapentin  100 mg Oral BID  . heparin injection (subcutaneous)  5,000 Units Subcutaneous Q8H  . hydrALAZINE  100 mg Oral Q8H  . insulin aspart  0-15 Units Subcutaneous TID WC  . insulin aspart  0-5 Units Subcutaneous QHS  . insulin glargine  15 Units Subcutaneous Once  . insulin glargine  25 Units Subcutaneous Daily  . isosorbide mononitrate  120 mg Oral Once per day on Sun Tue Thu Sat  . labetalol  300 mg Oral TID  . metoCLOPramide  2.5 mg Oral TID AC  . multivitamin  1 tablet Oral QHS  . pantoprazole  40 mg Oral Daily  . sertraline  50 mg Oral Daily  . sevelamer carbonate  2,400 mg Oral TID WC   . sodium chloride    . sodium chloride Stopped (09/22/17 1408)  . piperacillin-tazobactam (ZOSYN)  IV 3.375 g (09/23/17 2308)  . vancomycin     acetaminophen **OR** acetaminophen, dextrose, hydrALAZINE, lidocaine, LORazepam, ondansetron **OR** ondansetron (ZOFRAN) IV  Exam: Gen alert, not in distress, calm No rash, cyanosis or gangrene Sclera anicteric, throat clear No jvd or bruits Chest clear bilat to bases RRR no MRG Abd soft ntnd no mass, 2- 3+ ascites +bs GU defer MS no joint effusions or deformity, R BKA stump Ext no LE or UE edema, left foot and buttock wounds bandaged Neuro is alert, Ox 3 , nf Left forearm avf w bruit    Dialysis: mwf nw 4h 26min  82kg  2/2.5 bath  p2  Heparin none   Left forearm  avf -hectorol 1 ug - mircera 100 ug every 2 wks (4/26) - home bp rx> norvasc 10/clon 0.3 tid/ hydral 100 tid/ labetalol 300 tid    Impression: 1 cellulitis/ early abscess left buttock and left heel - on abx and prob bedside I&D today per gen surg 2 esrd on hd mwf 3 ascites recurrent - for paracentesis ?today 4 anemia ckd- hb 8.5 last esa on 4/26 not due until 3/10 5 volume- chronic ascites up 4kg plan uf 3L on hd today 6 hypertension severe on 4 bp meds, continue 7 uncont dm2 sp iv insulin, on ssi nowcad on statin/ nitrates 8 chronic pain 9 depression 10 cad on statin /nitrates   Plan - dialysis again today to get back on sched   Kelly Splinter MD [facility] pager [phone]   09/24/2017, 11:38 AM   Recent Labs  Lab 09/22/17 2048 09/23/17 0830 09/24/17 0530  NA 137 134* 136  K 5.8* 5.5* 4.8  CL 98* 93* 97*  CO2 20* 21* 23  GLUCOSE 180* 371* 288*  BUN 78* 76* 47*  CREATININE 8.69* 8.72* 5.77*  CALCIUM 7.8* 7.5* 7.1*  PHOS  --  9.2*  --    Recent Labs  Lab 09/22/17 0127 09/23/17 0830  AST 16  --   ALT 10*  --   ALKPHOS 98  --   BILITOT 0.3  --   PROT 5.7*  --   ALBUMIN 1.8* 1.4*   Recent Labs  Lab 09/22/17 0127  09/22/17 0526 09/23/17 0830 09/24/17 0530  WBC 11.9*  --  10.5 11.3* 9.5  NEUTROABS 9.7*  --   --   --  7.2  HGB 9.7*   < > 9.4* 8.4* 8.5*  HCT 31.2*   < > 29.3* 26.5* 27.4*  MCV 90.2  --  88.8 88.9 89.3  PLT 332  --  293 297 278   < > = values in this interval not displayed.   Iron/TIBC/Ferritin/ %Sat    Component Value Date/Time   IRON 26 (L) 06/23/2017 0708   TIBC 113 (L) 06/23/2017 0708   FERRITIN 1,050 (H) 06/23/2017 0708   IRONPCTSAT 23 06/23/2017 0708

## 2017-09-24 NOTE — Procedures (Signed)
PROCEDURE SUMMARY:  Successful US guided paracentesis from right lateral abdomen.  Yielded 6.7 liters of yellow fluid.  No immediate complications.  Pt tolerated well.   Specimen was not sent for labs.  Docia Barrier PA-C 09/24/2017 4:27 PM

## 2017-09-24 NOTE — Progress Notes (Signed)
[facility] Progress Note     Subjective: CC: L gluteal abscess No improvement in tenderness yet. Dressing not changed overnight but reinforced. No BMs overnight.   Objective: Vital signs in last 24 hours: Temp:  [97.5 F (36.4 C)-98.7 F (37.1 C)] 97.5 F (36.4 C) (05/01 0526) Pulse Rate:  [73-80] 73 (05/01 0526) Resp:  [18-20] 18 (04/30 1631) BP: (115-177)/(57-80) 144/59 (05/01 0526) SpO2:  [88 %-100 %] 88 % (05/01 0526) Weight:  [84.6 kg (186 lb 8.2 oz)-88.8 kg (195 lb 12.3 oz)] 84.6 kg (186 lb 8.2 oz) (04/30 2100) Last BM Date: 09/22/17  Intake/Output from previous day: 04/30 0701 - 05/01 0700 In: 50 [IV Piggyback:50] Out: 4500  Intake/Output this shift: No intake/output data recorded.  PE: Gen:  Alert, NAD, pleasant Card:  Regular rate and rhythm Pulm:  Normal effor Abd: Soft, non-tender, non-distended GU: packing removed with small amount purulence - drainage was more sanguinous than anything, surrounding erythema slightly improved, surrounding induration still present Skin: warm and dry, no rashes  Psych: A&Ox3   Lab Results:  Recent Labs    09/23/17 0830 09/24/17 0530  WBC 11.3* 9.5  HGB 8.4* 8.5*  HCT 26.5* 27.4*  PLT 297 278   BMET Recent Labs    09/23/17 0830 09/24/17 0530  NA 134* 136  K 5.5* 4.8  CL 93* 97*  CO2 21* 23  GLUCOSE 371* 288*  BUN 76* 47*  CREATININE 8.72* 5.77*  CALCIUM 7.5* 7.1*   PT/INR No results for input(s): LABPROT, INR in the last 72 hours. CMP     Component Value Date/Time   NA 136 09/24/2017 0530   K 4.8 09/24/2017 0530   CL 97 (L) 09/24/2017 0530   CO2 23 09/24/2017 0530   GLUCOSE 288 (H) 09/24/2017 0530   BUN 47 (H) 09/24/2017 0530   CREATININE 5.77 (H) 09/24/2017 0530   CALCIUM 7.1 (L) 09/24/2017 0530   PROT 5.7 (L) 09/22/2017 0127   ALBUMIN 1.4 (L) 09/23/2017 0830   AST 16 09/22/2017 0127   ALT 10 (L) 09/22/2017 0127   ALKPHOS 98 09/22/2017 0127   BILITOT 0.3 09/22/2017 0127   GFRNONAA  8 (L) 09/24/2017 0530   GFRAA 10 (L) 09/24/2017 0530   Lipase     Component Value Date/Time   LIPASE 30 02/14/2016 1841       Studies/Results: No results found.  Anti-infectives: Anti-infectives (From admission, onward)   Start     Dose/Rate Route Frequency Ordered Stop   09/24/17 1200  vancomycin (VANCOCIN) IVPB 1000 mg/200 mL premix     1,000 mg 200 mL/hr over 60 Minutes Intravenous Every M-W-F (Hemodialysis) 09/23/17 0912     09/23/17 0945  vancomycin (VANCOCIN) IVPB 1000 mg/200 mL premix     1,000 mg 200 mL/hr over 60 Minutes Intravenous To Hemodialysis 09/23/17 0931 09/23/17 1618   09/22/17 1000  piperacillin-tazobactam (ZOSYN) IVPB 3.375 g     3.375 g 12.5 mL/hr over 240 Minutes Intravenous Every 12 hours 09/22/17 0414     09/22/17 0230  vancomycin (VANCOCIN) 1,500 mg in sodium chloride 0.9 % 500 mL IVPB     1,500 mg 250 mL/hr over 120 Minutes Intravenous  Once 09/22/17 0211 09/22/17 0514   09/22/17 0215  vancomycin (VANCOCIN) IVPB 1000 mg/200 mL premix  Status:  Discontinued     1,000 mg 200 mL/hr over 60 Minutes Intravenous  Once 09/22/17 0200 09/22/17 0211   09/22/17 0215  piperacillin-tazobactam (ZOSYN) IVPB 3.375 g  3.375 g 100 mL/hr over 30 Minutes Intravenous  Once 09/22/17 0208 09/22/17 0252       Assessment/Plan ESRD on HD CHF w/ ascites - paracentesis today  T2DM, not controlled - blood glucose 288 this AM, A1C 12.0 Anemia of chronic disease - hgb 8.5  HTN CAD w/ Hx of cardiac arrest 03/2017 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

## 2017-09-24 NOTE — Consult Note (Signed)
[facility] Nurse wound consult note Reason for Consult:sacral healing stage IV ulcer, left posterior heel with intact blood filled bulla. Wound type:pressure Pressure Injury POA: Yes Measurement:sacral wound is 3.5cm x 2cm x 2cm with epibole wound edges. Wound bed 100% pale pink, moderate tan exudate.  Left posterior heel with 5cm round blood filled intact bulla, no drainage or odor. Wound bed:see above Drainage (amount, consistency, odor) see above Periwound: intact Dressing procedure/placement/frequency:To sacral wound,cleanse with NS, pack with piece of Aquacel Ag+, cover with foam, perform daily, may peel foam to perform dressing and reseal, change foam QD3 and prn soiling.Prevalon boot ordered for left foot to protect blood filled bulla on posterior heel as well as foam dressing. Patient's nutritional status is poor, recommend Nutritional Consult to help with wound healing, please order if you agree. We will not follow, but will remain available to this patient, to nursing, and the medical and/or surgical teams.  Please re-consult if we need to assist further.   Fara Olden, RN-C, WTA-C, [facility]

## 2017-09-24 NOTE — Progress Notes (Signed)
Attempted to bring pt down to IR for paracentesis. Pt on the way to hemodialysis. Will try in pm.

## 2017-09-24 NOTE — Progress Notes (Signed)
Inpatient Diabetes Program Recommendations  AACE/ADA: New Consensus Statement on Inpatient Glycemic Control (2015)  Target Ranges:  Prepandial:   less than 140 mg/dL      Peak postprandial:   less than 180 mg/dL (1-2 hours)      Critically ill patients:  140 - 180 mg/dL   Lab Results  Component Value Date   GLUCAP 122 (H) 09/24/2017   HGBA1C 12.0 (H) 09/22/2017    Review of Glycemic Control Results for [name], [name] (MRN [id number]) as of 09/24/2017 14:35  Ref. Range 09/23/2017 17:00 09/23/2017 22:24 09/24/2017 07:35 09/24/2017 13:40  Glucose-Capillary Latest Ref Range: 65 - 99 mg/dL 319 (H) 321 (H) 276 (H) 122 (H)   Diabetes history: Type 2 DM Outpatient Diabetes medications: Lantus 10 units QHS Current orders for Inpatient glycemic control: Lantus 25 units QD, Novolog 0-15 units TID, Novolog 0-5 units QHS  Inpatient Diabetes Program Recommendations:    Spoke with patient regarding home diabetes management. Reviewed patient's current A1c of 12.0%. Explained what a A1c is and what it measures. Also reviewed goal A1c with patient, importance of good glucose control @ home, and blood sugar goals. Reviewed patho of DM, need for insulin, and vascular changes that occur from poor glycemic control.  Patient had flat affect and was uninterested in talking about diabetes. Unsure if this was related to pain or frustration with diabetes. Patient states, " I check my BS 3 times day and it averages 170 mg/dL and I take my insulin." When asked, patient was not interested in following up with endocrinologist. Her PCP manages her insulin. Has no further questions regarding diabetes at this time.   Thanks, Bronson Curb, MSN, RNC-OB Diabetes Coordinator [phone] (8a-5p)

## 2017-09-24 NOTE — Progress Notes (Signed)
PROGRESS NOTE    [name]  HMC:[id number] DOB: 06/01/1975 DOA: 09/22/2017 PCP: Merrilee Seashore, MD   Brief Narrative: Patient is a 42 year old female with past medical history of end  stage renal disease on dialysis MWF, CHF with ascites, diabetes mellitus type 2, anemia, hypertension who also had a cardiac arrest in November 2018 during vascular procedure presented to the emergency department for worsening pain and swelling on her left buttock area.  Patient also had developed ulceration on her left heel.  Patient also has a chronic sacral  decubitus ulcer.  CT scan on presentation showed concerning for phlegmon versus early abscess on the left buttock .She was also found to have. hyperglycemia with anion gap of 19.  She was admitted for further management of left buttock abscess, DKA and ongoing dialysis.  Assessment & Plan:   Cellulitis of the left buttock with abscess -clinically improving, continue IV vancomycin and Zosyn Appreciate general surgery consultation Status post bedside debridement 4/30 -Blood cultures negative thus far -Continue wound care, air  mattress  Uncontrolled diabetes mellitus type 2/DKA on presentation:  -required insulin drip on admission, now resolved,  -continue Lantus and sliding-scale insulin  ESRD on dialysis:  -Nephrology following. -HD per renal  Chronic diastolic CHF/ chronic ascites -Requiring intermittent paracentesis, Last done 2 weeks ago, massive fluid thrill noted on exam -reordered paracentesis, pending -Last echo in 03/2017 with grade 2 diastolic dysfunction and preserved EF  Hypertension: BP elevated .  She is on amlodipine, clonidine, hydralazine, Imdur, labetalol at home which we will continue.Continue PRN meds  Anemia of chronic disease -Stable, continue EPO with HD  History of peripheral artery disease:  -Status post right BKA.  Continue  Lipitor.   DVT prophylaxis: HeparinSC Code Status: Full Family Communication:  None present at the bedside Disposition Plan: Home after resolution of abscess   Consultants: nephrology, surgery  Procedures: Dialysis  Antimicrobials: Vancomycin and Zosyn since 09/02/17  Subjective: -feels better today, discomfort at the decubitus site is improving  Objective: Vitals:   09/24/17 1200 09/24/17 1230 09/24/17 1241 09/24/17 1300  BP: (!) 166/71 (!) 173/77 (!) 162/79 (!) 174/82  Pulse: 70 71 73 75  Resp: 18 17 18 18   Temp:   98 F (36.7 C) 98.2 F (36.8 C)  TempSrc:   Oral Oral  SpO2:   98% 100%  Weight:   82.8 kg (182 lb 8.7 oz)   Height:        Intake/Output Summary (Last 24 hours) at 09/24/2017 1416 Last data filed at 09/24/2017 1241 Gross per 24 hour  Intake 50 ml  Output 3000 ml  Net -2950 ml   Filed Weights   09/23/17 2100 09/24/17 0825 09/24/17 1241  Weight: 84.6 kg (186 lb 8.2 oz) 85.8 kg (189 lb 2.5 oz) 82.8 kg (182 lb 8.7 oz)    Examination:  Gen: Awake, Alert, Oriented X 3, chronically ill-appearing HEENT: PERRLA, Neck supple, no JVD Lungs: Good air movement bilaterally, CTAB CVS: RRR,No Gallops,Rubs or new Murmurs Abd: soft, Non tender, distended, bowel sounds present Extremities: Positive edema, ) knee amputation, tender nodule with scab on left shin Skin: no new rashes Sacral decubitus ulcer with dressing   Data Reviewed: I have personally reviewed following labs and imaging studies  CBC: Recent Labs  Lab 09/22/17 0127 09/22/17 0135 09/22/17 0526 09/23/17 0830 09/24/17 0530  WBC 11.9*  --  10.5 11.3* 9.5  NEUTROABS 9.7*  --   --   --  7.2  HGB 9.7* 10.2*  9.4* 8.4* 8.5*  HCT 31.2* 30.0* 29.3* 26.5* 27.4*  MCV 90.2  --  88.8 88.9 89.3  PLT 332  --  293 297 244   Basic Metabolic Panel: Recent Labs  Lab 09/22/17 1030 09/22/17 1418 09/22/17 2048 09/23/17 0830 09/24/17 0530  NA 141 141 137 134* 136  K 4.6 4.6 5.8* 5.5* 4.8  CL 98* 103 98* 93* 97*  CO2 21* 20* 20* 21* 23  GLUCOSE 178* 83 180* 371* 288*  BUN 67* 65* 78*  76* 47*  CREATININE 7.55* 6.90* 8.69* 8.72* 5.77*  CALCIUM 7.7* 7.0* 7.8* 7.5* 7.1*  PHOS  --   --   --  9.2*  --    GFR: Estimated Creatinine Clearance: 16.7 mL/min (A) (by C-G formula based on SCr of 5.77 mg/dL (H)). Liver Function Tests: Recent Labs  Lab 09/22/17 0127 09/23/17 0830  AST 16  --   ALT 10*  --   ALKPHOS 98  --   BILITOT 0.3  --   PROT 5.7*  --   ALBUMIN 1.8* 1.4*   No results for input(s): LIPASE, AMYLASE in the last 168 hours. No results for input(s): AMMONIA in the last 168 hours. Coagulation Profile: No results for input(s): INR, PROTIME in the last 168 hours. Cardiac Enzymes: No results for input(s): CKTOTAL, CKMB, CKMBINDEX, TROPONINI in the last 168 hours. BNP (last 3 results) No results for input(s): PROBNP in the last 8760 hours. HbA1C: Recent Labs    09/22/17 0526  HGBA1C 12.0*   CBG: Recent Labs  Lab 09/23/17 1251 09/23/17 1700 09/23/17 2224 09/24/17 0735 09/24/17 1340  GLUCAP 327* 319* 321* 276* 122*   Lipid Profile: No results for input(s): CHOL, HDL, LDLCALC, TRIG, CHOLHDL, LDLDIRECT in the last 72 hours. Thyroid Function Tests: No results for input(s): TSH, T4TOTAL, FREET4, T3FREE, THYROIDAB in the last 72 hours. Anemia Panel: No results for input(s): VITAMINB12, FOLATE, FERRITIN, TIBC, IRON, RETICCTPCT in the last 72 hours. Sepsis Labs: No results for input(s): PROCALCITON, LATICACIDVEN in the last 168 hours.  Recent Results (from the past 240 hour(s))  Blood culture (routine x 2)     Status: None (Preliminary result)   Collection Time: 09/22/17  1:26 AM  Result Value Ref Range Status   Specimen Description   Final    BLOOD RIGHT HAND Performed at [facility] [address], [place], [place] [address]    Special Requests   Final    BOTTLES DRAWN AEROBIC AND ANAEROBIC Blood Culture adequate volume Performed at [facility] [address], [place], [place] [address]    Culture    Final    NO GROWTH 2 DAYS Performed at [facility] [address] [place], [place] [address]    Report Status PENDING  Incomplete  Blood culture (routine x 2)     Status: None (Preliminary result)   Collection Time: 09/22/17  1:38 AM  Result Value Ref Range Status   Specimen Description   Final    BLOOD RIGHT ANTECUBITAL Performed at [facility] [address], [place], [place] [address]    Special Requests   Final    BOTTLES DRAWN AEROBIC AND ANAEROBIC Blood Culture results may not be optimal due to an excessive volume of blood received in culture bottles Performed at [facility] [address], [place],  [address]    Culture   Final    NO GROWTH 2 DAYS Performed at [facility], [address], [place], [place] [address]    Report Status PENDING  Incomplete  MRSA PCR Screening     Status: None   Collection Time: 09/23/17  1:10 AM  Result Value Ref Range Status   MRSA by PCR NEGATIVE NEGATIVE Final    Comment:        The GeneXpert MRSA Assay (FDA approved for NASAL specimens only), is one component of a comprehensive MRSA colonization surveillance program. It is not intended to diagnose MRSA infection nor to guide or monitor treatment for MRSA infections. Performed at [facility], [place] [address] [place], [place] [address]          Radiology Studies: No results found.      Scheduled Meds: . amLODipine  10 mg Oral QPC supper  . atorvastatin  40 mg Oral q1800  . cinacalcet  60 mg Oral Q supper  . cloNIDine  0.3 mg Oral TID  . ferric citrate  420 mg Oral TID WC  . gabapentin  100 mg Oral BID  . heparin injection (subcutaneous)  5,000 Units Subcutaneous Q8H  . hydrALAZINE  100 mg Oral Q8H  . insulin aspart  0-15 Units Subcutaneous TID WC  . insulin aspart  0-5 Units Subcutaneous QHS  . insulin glargine  15 Units Subcutaneous Once  . insulin glargine  25 Units Subcutaneous Daily  .  isosorbide mononitrate  120 mg Oral Once per day on Sun Tue Thu Sat  . labetalol  300 mg Oral TID  . metoCLOPramide  2.5 mg Oral TID AC  . multivitamin  1 tablet Oral QHS  . pantoprazole  40 mg Oral Daily  . sertraline  50 mg Oral Daily  . sevelamer carbonate  2,400 mg Oral TID WC   Continuous Infusions: . sodium chloride    . sodium chloride Stopped (09/22/17 1408)  . piperacillin-tazobactam (ZOSYN)  IV 3.375 g (09/24/17 1353)  . vancomycin Stopped (09/24/17 1308)     LOS: 2 days    Time spent: 25 mins.More than 50% of that time was spent in counseling and/or coordination of care.      Domenic Polite, MD [facility] Page via [email or web address] Password [email or web address] 09/24/2017, 2:16 PM

## 2017-09-25 ENCOUNTER — Encounter (HOSPITAL_BASED_OUTPATIENT_CLINIC_OR_DEPARTMENT_OTHER): Payer: BLUE CROSS/BLUE SHIELD | Attending: Internal Medicine

## 2017-09-25 LAB — CBC
HCT: 29 % — ABNORMAL LOW (ref 36.0–46.0)
Hemoglobin: 8.9 g/dL — ABNORMAL LOW (ref 12.0–15.0)
MCH: 28.1 pg (ref 26.0–34.0)
MCHC: 30.7 g/dL (ref 30.0–36.0)
MCV: 91.5 fL (ref 78.0–100.0)
Platelets: 278 K/uL (ref 150–400)
RBC: 3.17 MIL/uL — ABNORMAL LOW (ref 3.87–5.11)
RDW: 16.5 % — ABNORMAL HIGH (ref 11.5–15.5)
WBC: 9 K/uL (ref 4.0–10.5)

## 2017-09-25 LAB — BASIC METABOLIC PANEL WITH GFR
Anion gap: 11 (ref 5–15)
BUN: 33 mg/dL — ABNORMAL HIGH (ref 6–20)
CO2: 27 mmol/L (ref 22–32)
Calcium: 7.1 mg/dL — ABNORMAL LOW (ref 8.9–10.3)
Chloride: 99 mmol/L — ABNORMAL LOW (ref 101–111)
Creatinine, Ser: 4.6 mg/dL — ABNORMAL HIGH (ref 0.44–1.00)
GFR calc Af Amer: 13 mL/min — ABNORMAL LOW (ref >60)
GFR calc non Af Amer: 11 mL/min — ABNORMAL LOW (ref >60)
Glucose, Bld: 329 mg/dL — ABNORMAL HIGH (ref 65–99)
Potassium: 4.3 mmol/L (ref 3.5–5.1)
Sodium: 137 mmol/L (ref 135–145)

## 2017-09-25 LAB — GLUCOSE, CAPILLARY
GLUCOSE-CAPILLARY: 304 mg/dL — AB (ref 65–99)
GLUCOSE-CAPILLARY: 290 mg/dL — AB (ref 65–99)
GLUCOSE-CAPILLARY: 270 mg/dL — AB (ref 65–99)
Glucose-Capillary: 320 mg/dL — ABNORMAL HIGH (ref 65–99)

## 2017-09-25 MED ORDER — INSULIN GLARGINE 100 UNIT/ML ~~LOC~~ SOLN
35.0000 [IU] | Freq: Every day | SUBCUTANEOUS | Status: DC
  Filled 2017-09-25 (×2): qty 0.35

## 2017-09-25 MED ORDER — INSULIN GLARGINE 100 UNIT/ML ~~LOC~~ SOLN
10.0000 [IU] | Freq: Once | SUBCUTANEOUS | Status: AC
  Administered 2017-09-25: 10 [IU] via SUBCUTANEOUS
  Filled 2017-09-25: qty 0.1

## 2017-09-25 MED ORDER — DOXYCYCLINE HYCLATE 100 MG PO TABS
100.0000 mg | ORAL_TABLET | Freq: Two times a day (BID) | ORAL | Status: DC
  Administered 2017-09-25 – 2017-09-26 (×2): 100 mg via ORAL
  Filled 2017-09-25 (×2): qty 1

## 2017-09-25 MED ORDER — AMOXICILLIN-POT CLAVULANATE 500-125 MG PO TABS
500.0000 mg | ORAL_TABLET | ORAL | Status: DC
  Administered 2017-09-25: 500 mg via ORAL
  Filled 2017-09-25: qty 1

## 2017-09-25 MED ORDER — DOXERCALCIFEROL 4 MCG/2ML IV SOLN
1.0000 ug | INTRAVENOUS | Status: DC
  Administered 2017-09-26: 1 ug via INTRAVENOUS
  Filled 2017-09-25: qty 2

## 2017-09-25 NOTE — Progress Notes (Addendum)
[facility] Progress Note   Subjective:  Therapeutic paracentesis yesterday 6.7L off  No c/os. Hoping to go home tomorrow   Objective Vitals:   09/24/17 2222 09/25/17 0526 09/25/17 0900 09/25/17 1034  BP: (!) 154/68 (!) 152/74 (!) 156/75 (!) 149/70  Pulse: 76 71 72 72  Resp: 19   20  Temp: 98.5 F (36.9 C) 97.6 F (36.4 C)  97.7 F (36.5 C)  TempSrc: Oral Oral  Oral  SpO2: 98% 93%  100%  Weight: 82.6 kg (182 lb 1.6 oz)     Height:       Physical Exam General: Tall WNWD female NAD Heart: RRR Lungs: CTAB  Abdomen: distended, soft   Extremities: R BKA no LE edema  Dialysis Access: LUE AVF +bruit   Dialysis Orders:  mwf nw 4h 10min 82kg 2/2.5 bath p2 Heparin none Left forearm avf -hectorol 1 ug - mircera 100 ug every 2 wks (4/26) - home bp rx> norvasc 10/clon 0.3 tid/ hydral 100 tid/ labetalol 300 tid   Assessment/Plan: 1. Cellulitis/ abscess left buttock and left heel - s/p IV abx, now po and bedside I&D per gen surg 2. Ascites, recurrent - paracentesis on 5/1 - 6.7L removed  3. ESRD  MWF. Next HD 5/3 4. Anemia   Not due for ESA redose yet,  next dose due 5/10  5. HTN/volume , severe on 4 meds, Post HD wt 82.8kg. Close to EDW. BP/volume improving with HD/paracentesis.  6. MBD - Phos ^ Continue Hectorol/Auryxia binder/Sensipar 7. DM2 SSI per primary  8. CAD statin/nitrates  9. Chronic pain 10. Depression   Janet Larina Earthly PA-C [facility] Pager [phone] 09/25/2017,10:39 AM  LOS: 3 days   Pt seen, examined and agree w A/P as above.  Kelly Splinter MD [facility] pager [phone]   09/25/2017, 4:42 PM    Additional Objective Labs: Basic Metabolic Panel: Recent Labs  Lab 09/23/17 0830 09/24/17 0530 09/25/17 0443  NA 134* 136 137  K 5.5* 4.8 4.3  CL 93* 97* 99*  CO2 21* 23 27  GLUCOSE 371* 288* 329*  BUN 76* 47* 33*  CREATININE 8.72* 5.77* 4.60*  CALCIUM 7.5* 7.1* 7.1*  PHOS 9.2*  --   --     CBC: Recent Labs  Lab 09/22/17 0127  09/22/17 0526 09/23/17 0830 09/24/17 0530 09/25/17 0443  WBC 11.9*  --  10.5 11.3* 9.5 9.0  NEUTROABS 9.7*  --   --   --  7.2  --   HGB 9.7*   < > 9.4* 8.4* 8.5* 8.9*  HCT 31.2*   < > 29.3* 26.5* 27.4* 29.0*  MCV 90.2  --  88.8 88.9 89.3 91.5  PLT 332  --  293 297 278 278   < > = values in this interval not displayed.   Blood Culture    Component Value Date/Time   SDES  09/22/2017 0138    BLOOD RIGHT ANTECUBITAL Performed at [name] [address], [place], [place] [address]    SPECREQUEST  09/22/2017 0138    BOTTLES DRAWN AEROBIC AND ANAEROBIC Blood Culture results may not be optimal due to an excessive volume of blood received in culture bottles Performed at [facility], [place] [address] [place], [place] [address]    CULT  09/22/2017 0138    NO GROWTH 2 DAYS Performed at [facility] [address], [place],  [address]    REPTSTATUS PENDING 09/22/2017 [phone]  Cardiac Enzymes: No results for input(s): CKTOTAL, CKMB, CKMBINDEX, TROPONINI in the last 168 hours. CBG: Recent Labs  Lab 09/24/17 0735 09/24/17 1340 09/24/17 1649 09/24/17 2028 09/25/17 0902  GLUCAP 276* 122* 201* 186* 304*   Iron Studies: No results for input(s): IRON, TIBC, TRANSFERRIN, FERRITIN in the last 72 hours. Lab Results  Component Value Date   INR 1.14 06/17/2017   INR 1.20 06/11/2017   INR 1.19 05/09/2017   Medications: . piperacillin-tazobactam (ZOSYN)  IV 3.375 g (09/25/17 0921)  . vancomycin Stopped (09/24/17 1308)   . amLODipine  10 mg Oral QPC supper  . atorvastatin  40 mg Oral q1800  . cinacalcet  60 mg Oral Q supper  . cloNIDine  0.3 mg Oral TID  . ferric citrate  420 mg Oral TID WC  . gabapentin  100 mg Oral BID  . heparin injection (subcutaneous)  5,000 Units Subcutaneous Q8H  . hydrALAZINE  100 mg Oral Q8H  . insulin aspart  0-15 Units Subcutaneous TID WC  . insulin aspart   0-5 Units Subcutaneous QHS  . insulin glargine  35 Units Subcutaneous Daily  . isosorbide mononitrate  120 mg Oral Once per day on Sun Tue Thu Sat  . labetalol  300 mg Oral TID  . metoCLOPramide  2.5 mg Oral TID AC  . multivitamin  1 tablet Oral QHS  . pantoprazole  40 mg Oral Daily  . sertraline  50 mg Oral Daily  . sevelamer carbonate  2,400 mg Oral TID WC

## 2017-09-25 NOTE — Progress Notes (Signed)
PROGRESS NOTE    [name]  AJO:[id number] DOB: January 08, 1976 DOA: 09/22/2017 PCP: Merrilee Seashore, MD   Brief Narrative: Patient is a 42 year old female with past medical history of end  stage renal disease on dialysis MWF, CHF with ascites, diabetes mellitus type 2, anemia, hypertension who also had a cardiac arrest in November 2018 during vascular procedure presented to the emergency department for worsening pain and swelling on her left buttock area.  Patient also had developed ulceration on her left heel.  Patient also has a chronic sacral  decubitus ulcer.  CT scan on presentation showed concerning for phlegmon versus early abscess on the left buttock .She was also found to have. hyperglycemia with anion gap of 19.  She was admitted for further management of left buttock abscess, DKA and ongoing dialysis. -improving on antibiotics and status post bedside debridement  Assessment & Plan:   Cellulitis of the left buttock with abscess -clinically improving on IV vancomycin and Zosyn -Appreciate general surgery consultation -Status post bedside debridement 4/30 -Blood cultures negative thus far -change IV vancomycin and Zosyn to doxycycline and Augmentin -Continue wound care, air  Mattress -home tomorrow if stable, PT, discharge planning  Uncontrolled diabetes mellitus type 2/DKA on presentation:  -required insulin drip on admission, now resolved,  -continue Lantus and sliding-scale insulin -increased dose  ESRD on dialysis:  -Nephrology following. -HD per renal  Chronic diastolic CHF/ chronic ascites -Requiring intermittent paracentesis, Last done 2 weeks ago, massive fluid thrill noted on exam -reordered paracentesis, pending -Last echo in 03/2017 with grade 2 diastolic dysfunction and preserved EF  Hypertension: BP elevated .  She is on amlodipine, clonidine, hydralazine, Imdur, labetalol at home which we will continue.Continue PRN meds  Anemia of chronic  disease -Stable, continue EPO with HD  History of peripheral artery disease:  -Status post right BKA.  Continue  Lipitor.   DVT prophylaxis: HeparinSC Code Status: Full Family Communication: None present at the bedside Disposition Plan: Home tomorrow if stable   Consultants: nephrology, surgery  Procedures: Dialysis  Antimicrobials: Vancomycin and Zosyn since 09/02/17  Subjective: -feels better today, discomfort at the decubitus site is improving  Objective: Vitals:   09/24/17 2222 09/25/17 0526 09/25/17 0900 09/25/17 1034  BP: (!) 154/68 (!) 152/74 (!) 156/75 (!) 149/70  Pulse: 76 71 72 72  Resp: 19   20  Temp: 98.5 F (36.9 C) 97.6 F (36.4 C)  97.7 F (36.5 C)  TempSrc: Oral Oral  Oral  SpO2: 98% 93%  100%  Weight: 82.6 kg (182 lb 1.6 oz)     Height:        Intake/Output Summary (Last 24 hours) at 09/25/2017 1439 Last data filed at 09/25/2017 9470 Gross per 24 hour  Intake 290 ml  Output 0 ml  Net 290 ml   Filed Weights   09/24/17 0825 09/24/17 1241 09/24/17 2222  Weight: 85.8 kg (189 lb 2.5 oz) 82.8 kg (182 lb 8.7 oz) 82.6 kg (182 lb 1.6 oz)    Examination:  Gen.: Alert awake oriented 3, chronically ill-appearing HEENT pupils equal reactive, no JVD CVS S1-S2 regular rate rhythm Lungs clear bilaterally next line abdomen soft nontender positive bowel sounds Extremities: right BKA, small nodule with scab on the left shin Skin as above Sacral decubitus ulcer with foam dressing  Data Reviewed: I have personally reviewed following labs and imaging studies  CBC: Recent Labs  Lab 09/22/17 0127 09/22/17 0135 09/22/17 0526 09/23/17 0830 09/24/17 0530 09/25/17 0443  WBC 11.9*  --  10.5 11.3* 9.5 9.0  NEUTROABS 9.7*  --   --   --  7.2  --   HGB 9.7* 10.2* 9.4* 8.4* 8.5* 8.9*  HCT 31.2* 30.0* 29.3* 26.5* 27.4* 29.0*  MCV 90.2  --  88.8 88.9 89.3 91.5  PLT 332  --  293 297 278 834   Basic Metabolic Panel: Recent Labs  Lab 09/22/17 1418 09/22/17 2048  09/23/17 0830 09/24/17 0530 09/25/17 0443  NA 141 137 134* 136 137  K 4.6 5.8* 5.5* 4.8 4.3  CL 103 98* 93* 97* 99*  CO2 20* 20* 21* 23 27  GLUCOSE 83 180* 371* 288* 329*  BUN 65* 78* 76* 47* 33*  CREATININE 6.90* 8.69* 8.72* 5.77* 4.60*  CALCIUM 7.0* 7.8* 7.5* 7.1* 7.1*  PHOS  --   --  9.2*  --   --    GFR: Estimated Creatinine Clearance: 20.9 mL/min (A) (by C-G formula based on SCr of 4.6 mg/dL (H)). Liver Function Tests: Recent Labs  Lab 09/22/17 0127 09/23/17 0830  AST 16  --   ALT 10*  --   ALKPHOS 98  --   BILITOT 0.3  --   PROT 5.7*  --   ALBUMIN 1.8* 1.4*   No results for input(s): LIPASE, AMYLASE in the last 168 hours. No results for input(s): AMMONIA in the last 168 hours. Coagulation Profile: No results for input(s): INR, PROTIME in the last 168 hours. Cardiac Enzymes: No results for input(s): CKTOTAL, CKMB, CKMBINDEX, TROPONINI in the last 168 hours. BNP (last 3 results) No results for input(s): PROBNP in the last 8760 hours. HbA1C: No results for input(s): HGBA1C in the last 72 hours. CBG: Recent Labs  Lab 09/24/17 1340 09/24/17 1649 09/24/17 2028 09/25/17 0902 09/25/17 1148  GLUCAP 122* 201* 186* 304* 290*   Lipid Profile: No results for input(s): CHOL, HDL, LDLCALC, TRIG, CHOLHDL, LDLDIRECT in the last 72 hours. Thyroid Function Tests: No results for input(s): TSH, T4TOTAL, FREET4, T3FREE, THYROIDAB in the last 72 hours. Anemia Panel: No results for input(s): VITAMINB12, FOLATE, FERRITIN, TIBC, IRON, RETICCTPCT in the last 72 hours. Sepsis Labs: No results for input(s): PROCALCITON, LATICACIDVEN in the last 168 hours.  Recent Results (from the past 240 hour(s))  Blood culture (routine x 2)     Status: None (Preliminary result)   Collection Time: 09/22/17  1:26 AM  Result Value Ref Range Status   Specimen Description   Final    BLOOD RIGHT HAND Performed at [facility] [address] [place], [place] [address]     Special Requests   Final    BOTTLES DRAWN AEROBIC AND ANAEROBIC Blood Culture adequate volume Performed at [facility] [address] [place], [place] [address]    Culture   Final    NO GROWTH 2 DAYS Performed at [facility] [address] [place], [place] [address]    Report Status PENDING  Incomplete  Blood culture (routine x 2)     Status: None (Preliminary result)   Collection Time: 09/22/17  1:38 AM  Result Value Ref Range Status   Specimen Description   Final    BLOOD RIGHT ANTECUBITAL Performed at [facility] [address], [place],  [address]    Special Requests   Final    BOTTLES DRAWN AEROBIC AND ANAEROBIC Blood Culture results may not be optimal due to an excessive volume of blood received in culture bottles Performed at [facility],  [place] [address], [place], [place] [address]    Culture   Final    NO GROWTH 2 DAYS Performed at [facility] [address], [place], [place] [address]    Report Status PENDING  Incomplete  MRSA PCR Screening     Status: None   Collection Time: 09/23/17  1:10 AM  Result Value Ref Range Status   MRSA by PCR NEGATIVE NEGATIVE Final    Comment:        The GeneXpert MRSA Assay (FDA approved for NASAL specimens only), is one component of a comprehensive MRSA colonization surveillance program. It is not intended to diagnose MRSA infection nor to guide or monitor treatment for MRSA infections. Performed at [facility], [place] [address], [place], [place] [address]          Radiology Studies: Ir Paracentesis  Result Date: 09/24/2017 INDICATION: Patient with recurrent ascites. Request is made for therapeutic paracentesis. EXAM: ULTRASOUND GUIDED THERAPEUTIC PARACENTESIS MEDICATIONS: 10 mL 2% lidocaine COMPLICATIONS: None immediate. PROCEDURE: Informed written consent was obtained from the patient after a discussion of the risks, benefits and  alternatives to treatment. A timeout was performed prior to the initiation of the procedure. Initial ultrasound scanning demonstrates a large amount of ascites within the right lower abdominal quadrant. The right lower abdomen was prepped and draped in the usual sterile fashion. 2% lidocaine was used for local anesthesia. Following this, a 19 gauge, 7-cm, Yueh catheter was introduced. An ultrasound image was saved for documentation purposes. The paracentesis was performed. The catheter was removed and a dressing was applied. The patient tolerated the procedure well without immediate post procedural complication. FINDINGS: A total of approximately 6.7 liters of yellow fluid was removed. IMPRESSION: Successful ultrasound-guided therapeutic paracentesis yielding 6.7 liters of peritoneal fluid. Read by: Brynda Greathouse PA-C Electronically Signed   By: Sandi Mariscal M.D.   On: 09/24/2017 16:30        Scheduled Meds: . amLODipine  10 mg Oral QPC supper  . amoxicillin-clavulanate  500 mg Oral Q24H  . atorvastatin  40 mg Oral q1800  . cinacalcet  60 mg Oral Q supper  . cloNIDine  0.3 mg Oral TID  . [START ON 09/26/2017] doxercalciferol  1 mcg Intravenous Q M,W,F-HD  . doxycycline  100 mg Oral Q12H  . ferric citrate  420 mg Oral TID WC  . gabapentin  100 mg Oral BID  . heparin injection (subcutaneous)  5,000 Units Subcutaneous Q8H  . hydrALAZINE  100 mg Oral Q8H  . insulin aspart  0-15 Units Subcutaneous TID WC  . insulin aspart  0-5 Units Subcutaneous QHS  . insulin glargine  35 Units Subcutaneous Daily  . isosorbide mononitrate  120 mg Oral Once per day on Sun Tue Thu Sat  . labetalol  300 mg Oral TID  . metoCLOPramide  2.5 mg Oral TID AC  . multivitamin  1 tablet Oral QHS  . pantoprazole  40 mg Oral Daily  . sertraline  50 mg Oral Daily  . sevelamer carbonate  2,400 mg Oral TID WC   Continuous Infusions:    LOS: 3 days    Time spent: 25 mins.More than 50% of that time was spent in counseling  and/or coordination of care.      Domenic Polite, MD [facility] Page via [email or web address] Password [facility] 09/25/2017, 2:39 PM

## 2017-09-25 NOTE — Final Consult Note (Signed)
[facility] Progress Note     Subjective: CC: L gluteal abscess Did not do any sitz baths yesterday but dressing was changed 3x. Pain improving.   Objective: Vital signs in last 24 hours: Temp:  [97.6 F (36.4 C)-98.5 F (36.9 C)] 97.6 F (36.4 C) (05/02 0526) Pulse Rate:  [69-76] 71 (05/02 0526) Resp:  [17-21] 19 (05/01 2222) BP: (147-174)/(62-83) 152/74 (05/02 0526) SpO2:  [93 %-100 %] 93 % (05/02 0526) Weight:  [82.6 kg (182 lb 1.6 oz)-82.8 kg (182 lb 8.7 oz)] 82.6 kg (182 lb 1.6 oz) (05/01 2222) Last BM Date: 09/24/17  Intake/Output from previous day: 05/01 0701 - 05/02 0700 In: 530 [P.O.:480; IV Piggyback:50] Out: 3000  Intake/Output this shift: No intake/output data recorded.  PE: Gen:  Alert, NAD, pleasant Card:  Regular rate and rhythm Pulm:  Normal effort Abd: Soft, non-tender, non-distended GU: packing removed with small amount purulence, surrounding erythema improving, surrounding induration improving  Skin: warm and dry, no rashes  Psych: A&Ox3     Lab Results:  Recent Labs    09/24/17 0530 09/25/17 0443  WBC 9.5 9.0  HGB 8.5* 8.9*  HCT 27.4* 29.0*  PLT 278 278   BMET Recent Labs    09/24/17 0530 09/25/17 0443  NA 136 137  K 4.8 4.3  CL 97* 99*  CO2 23 27  GLUCOSE 288* 329*  BUN 47* 33*  CREATININE 5.77* 4.60*  CALCIUM 7.1* 7.1*   PT/INR No results for input(s): LABPROT, INR in the last 72 hours. CMP     Component Value Date/Time   NA 137 09/25/2017 0443   K 4.3 09/25/2017 0443   CL 99 (L) 09/25/2017 0443   CO2 27 09/25/2017 0443   GLUCOSE 329 (H) 09/25/2017 0443   BUN 33 (H) 09/25/2017 0443   CREATININE 4.60 (H) 09/25/2017 0443   CALCIUM 7.1 (L) 09/25/2017 0443   PROT 5.7 (L) 09/22/2017 0127   ALBUMIN 1.4 (L) 09/23/2017 0830   AST 16 09/22/2017 0127   ALT 10 (L) 09/22/2017 0127   ALKPHOS 98 09/22/2017 0127   BILITOT 0.3 09/22/2017 0127   GFRNONAA 11 (L) 09/25/2017 0443   GFRAA 13 (L) 09/25/2017 0443    Lipase     Component Value Date/Time   LIPASE 30 02/14/2016 1841       Studies/Results: Ir Paracentesis  Result Date: 09/24/2017 INDICATION: Patient with recurrent ascites. Request is made for therapeutic paracentesis. EXAM: ULTRASOUND GUIDED THERAPEUTIC PARACENTESIS MEDICATIONS: 10 mL 2% lidocaine COMPLICATIONS: None immediate. PROCEDURE: Informed written consent was obtained from the patient after a discussion of the risks, benefits and alternatives to treatment. A timeout was performed prior to the initiation of the procedure. Initial ultrasound scanning demonstrates a large amount of ascites within the right lower abdominal quadrant. The right lower abdomen was prepped and draped in the usual sterile fashion. 2% lidocaine was used for local anesthesia. Following this, a 19 gauge, 7-cm, Yueh catheter was introduced. An ultrasound image was saved for documentation purposes. The paracentesis was performed. The catheter was removed and a dressing was applied. The patient tolerated the procedure well without immediate post procedural complication. FINDINGS: A total of approximately 6.7 liters of yellow fluid was removed. IMPRESSION: Successful ultrasound-guided therapeutic paracentesis yielding 6.7 liters of peritoneal fluid. Read by: Brynda Greathouse PA-C Electronically Signed   By: Sandi Mariscal M.D.   On: 09/24/2017 16:30    Anti-infectives: Anti-infectives (From admission, onward)   Start     Dose/Rate Route Frequency Ordered  Stop   09/24/17 1200  vancomycin (VANCOCIN) IVPB 1000 mg/200 mL premix     1,000 mg 200 mL/hr over 60 Minutes Intravenous Every M-W-F (Hemodialysis) 09/23/17 0912     09/23/17 0945  vancomycin (VANCOCIN) IVPB 1000 mg/200 mL premix     1,000 mg 200 mL/hr over 60 Minutes Intravenous To Hemodialysis 09/23/17 0931 09/23/17 1618   09/22/17 1000  piperacillin-tazobactam (ZOSYN) IVPB 3.375 g     3.375 g 12.5 mL/hr over 240 Minutes Intravenous Every 12 hours 09/22/17 0414      09/22/17 0230  vancomycin (VANCOCIN) 1,500 mg in sodium chloride 0.9 % 500 mL IVPB     1,500 mg 250 mL/hr over 120 Minutes Intravenous  Once 09/22/17 0211 09/22/17 0514   09/22/17 0215  vancomycin (VANCOCIN) IVPB 1000 mg/200 mL premix  Status:  Discontinued     1,000 mg 200 mL/hr over 60 Minutes Intravenous  Once 09/22/17 0200 09/22/17 0211   09/22/17 0215  piperacillin-tazobactam (ZOSYN) IVPB 3.375 g     3.375 g 100 mL/hr over 30 Minutes Intravenous  Once 09/22/17 0208 09/22/17 0252       Assessment/Plan ESRD on HD CHF w/ ascites - paracentesis today  T2DM, not controlled - blood glucose 329 this AM, A1C 12.0 Anemia of chronic disease - hgb 8.9 HTN CAD w/ Hx of cardiac arrest 03/2017 Hx of R BKA 05/2017 Hyperkalemia - improved, 4.3  Cellulitis with small abscess left buttock - s/p bedside I&D 4/30 - WBC 9.0, afebrile - pack wound with saline moistened gauze BID, or more often if soiled - sitz baths 3-4x daily  ASN:KNLZJ CM diet QBH:ALPF ID:[id number] zosyn 4/30>>  We will sign off, no further surgical debridement indicated at this time. Continue wound care. Call with questions or concerns.   LOS: 3 days    Brigid Re , [facility] 09/25/2017, 9:13 AM Pager: [phone] Consults: [phone] Mon-Fri 7:00 am-4:30 pm Sat-Sun 7:00 am-11:30 am

## 2017-09-25 NOTE — Progress Notes (Signed)
Pharmacy Antibiotic Note  MAYURI STAPLES is a 42 y.o. female admitted on 09/22/2017 with left buttock abscess.  Pharmacy has been consulted for vancomycin and Zosyn dosing.  She is ESRD on HD MWF.   Previous levels: 2/6 pre-HD vanc level 17 2/13 pre -HD vanc level = 11 mcg/ml on 750mg  qHD MWF  Plan: Vancomycin 1 gm qHD MWF based on levels during Feb 2019 admission  Zosyn 3.375 gm IV q12h (4 hr infusion) F/u HD schedule, culture data, and LOT/de-escalation PreHD vancomycin levels as needed  Height: 6\' 4"  (193 cm) Weight: 182 lb 1.6 oz (82.6 kg) IBW/kg (Calculated) : 82.3  Temp (24hrs), Avg:98 F (36.7 C), Min:97.6 F (36.4 C), Max:98.5 F (36.9 C)  Recent Labs  Lab 09/22/17 0127  09/22/17 0526  09/22/17 1418 09/22/17 2048 09/23/17 0830 09/24/17 0530 09/25/17 0443  WBC 11.9*  --  10.5  --   --   --  11.3* 9.5 9.0  CREATININE 7.50*   < > 7.70*   < > 6.90* 8.69* 8.72* 5.77* 4.60*   < > = values in this interval not displayed.    Estimated Creatinine Clearance: 20.9 mL/min (A) (by C-G formula based on SCr of 4.6 mg/dL (H)).    Allergies  Allergen Reactions  . Cephalexin Other (See Comments)    Reaction unknown- Childhood allergy Tolerated Ceftriaxone, ceftazidime, zosyn, augmentin in the past  . Sulfamethoxazole-Trimethoprim Other (See Comments)    Unknown reaction. Pt states that she was told by her mother that she had allergy to Bactrim as a child.    Antimicrobials this admission: Vanc 4/29 >> Zosyn 4/29 >>  Dose adjustments this admission:  Microbiology results: 4/29 BCx2 >> NGTD 4/03 MRSA PCR >> neg   Renold Genta, PharmD, BCPS Clinical Pharmacist Clinical phone for 09/25/2017 until 3p is [phone] After 3p, please call Main Rx at [phone] for assistance 09/25/2017 7:14 AM

## 2017-09-26 LAB — GLUCOSE, CAPILLARY: GLUCOSE-CAPILLARY: 241 mg/dL — AB (ref 65–99)

## 2017-09-26 LAB — HEPATITIS B SURFACE ANTIGEN: HEP B S AG: NEGATIVE

## 2017-09-26 LAB — CBC
HCT: 26.7 % — ABNORMAL LOW (ref 36.0–46.0)
Hemoglobin: 8.2 g/dL — ABNORMAL LOW (ref 12.0–15.0)
MCH: 27.8 pg (ref 26.0–34.0)
MCHC: 30.7 g/dL (ref 30.0–36.0)
MCV: 90.5 fL (ref 78.0–100.0)
PLATELETS: 259 10*3/uL (ref 150–400)
RBC: 2.95 MIL/uL — ABNORMAL LOW (ref 3.87–5.11)
RDW: 16.3 % — ABNORMAL HIGH (ref 11.5–15.5)
WBC: 10.1 10*3/uL (ref 4.0–10.5)

## 2017-09-26 LAB — RENAL FUNCTION PANEL
ALBUMIN: 1.5 g/dL — AB (ref 3.5–5.0)
Anion gap: 14 (ref 5–15)
BUN: 49 mg/dL — AB (ref 6–20)
CHLORIDE: 95 mmol/L — AB (ref 101–111)
CO2: 25 mmol/L (ref 22–32)
CREATININE: 5.85 mg/dL — AB (ref 0.44–1.00)
Calcium: 7.1 mg/dL — ABNORMAL LOW (ref 8.9–10.3)
GFR calc Af Amer: 9 mL/min — ABNORMAL LOW (ref >60)
GFR, EST NON AFRICAN AMERICAN: 8 mL/min — AB (ref >60)
GLUCOSE: 338 mg/dL — AB (ref 65–99)
PHOSPHORUS: 5.5 mg/dL — AB (ref 2.5–4.6)
Potassium: 4.8 mmol/L (ref 3.5–5.1)
Sodium: 134 mmol/L — ABNORMAL LOW (ref 135–145)

## 2017-09-26 MED ORDER — AMOXICILLIN-POT CLAVULANATE 500-125 MG PO TABS: 500.0000 mg | ORAL_TABLET | ORAL | 0 refills | Status: DC

## 2017-09-26 MED ORDER — INSULIN GLARGINE 100 UNIT/ML SOLOSTAR PEN: 20.0000 [IU] | PEN_INJECTOR | Freq: Every day | SUBCUTANEOUS | 2 refills | Status: AC

## 2017-09-26 MED ORDER — INSULIN GLARGINE 100 UNIT/ML ~~LOC~~ SOLN
45.0000 [IU] | Freq: Every day | SUBCUTANEOUS | Status: DC
  Administered 2017-09-26: 45 [IU] via SUBCUTANEOUS
  Filled 2017-09-26: qty 0.45

## 2017-09-26 MED ORDER — LIDOCAINE-PRILOCAINE 2.5-2.5 % EX CREA: 1.0000 "application " | TOPICAL_CREAM | CUTANEOUS | Status: DC | PRN

## 2017-09-26 MED ORDER — HEPARIN SODIUM (PORCINE) 1000 UNIT/ML DIALYSIS: 1000.0000 [IU] | INTRAMUSCULAR | Status: DC | PRN

## 2017-09-26 MED ORDER — DOXYCYCLINE HYCLATE 100 MG PO TABS: 100.0000 mg | ORAL_TABLET | Freq: Two times a day (BID) | ORAL | 0 refills | Status: DC

## 2017-09-26 MED ORDER — PENTAFLUOROPROP-TETRAFLUOROETH EX AERO: 1.0000 "application " | INHALATION_SPRAY | CUTANEOUS | Status: DC | PRN

## 2017-09-26 MED ORDER — SODIUM CHLORIDE 0.9 % IV SOLN: 100.0000 mL | INTRAVENOUS | Status: DC | PRN

## 2017-09-26 MED ORDER — DOXERCALCIFEROL 4 MCG/2ML IV SOLN
INTRAVENOUS | Status: AC
  Filled 2017-09-26: qty 2

## 2017-09-26 NOTE — Progress Notes (Signed)
[name] Progress Note   Subjective: on hd looks better less swollen  Objective Vitals:   09/26/17 0900 09/26/17 0930 09/26/17 1000 09/26/17 1030  BP: (!) 160/72 (!) 161/71 (!) 170/78 (!) 163/70  Pulse: 70 72 70 70  Resp:      Temp:      TempSrc:      SpO2:      Weight:      Height:       Physical Exam General: Tall WNWD female NAD Heart: RRR Lungs: CTAB  Abdomen: distended, soft   Extremities: R BKA no LE edema  Dialysis Access: LUE AVF +bruit   Dialysis Orders:  mwf nw 4h 98min 82kg 2/2.5 bath p2 Heparin none Left forearm avf -hectorol 1 ug - mircera 100 ug every 2 wks (4/26) - home bp rx> norvasc 10/clon 0.3 tid/ hydral 100 tid/ labetalol 300 tid   Assessment/Plan: 1. Left gluteal abscess - sp I&D per gen surg and on po abx for dc today 2. Ascites, recurrent - paracentesis on 5/1 - 6.7L removed  3. ESRD  MWF. HD today 4. Anemia   Not due for ESA redose yet,  next dose due 5/10  5. HTN/volume , severe on 4 meds, Post HD wt 82.8kg. Close to EDW. BP/volume improving with HD/paracentesis.  6. MBD - Phos ^ Continue Hectorol/Auryxia binder/Sensipar 7. DM2 SSI per primary  8. CAD statin/nitrates  9. Chronic pain 10. Depression    Janet Splinter MD [name] pager [phone]   09/26/2017, 10:53 AM    Additional Objective Labs: Basic Metabolic Panel: Recent Labs  Lab 09/23/17 0830 09/24/17 0530 09/25/17 0443 09/26/17 0836  NA 134* 136 137 134*  K 5.5* 4.8 4.3 4.8  CL 93* 97* 99* 95*  CO2 21* 23 27 25   GLUCOSE 371* 288* 329* 338*  BUN 76* 47* 33* 49*  CREATININE 8.72* 5.77* 4.60* 5.85*  CALCIUM 7.5* 7.1* 7.1* 7.1*  PHOS 9.2*  --   --  5.5*   CBC: Recent Labs  Lab 09/22/17 0127  09/22/17 0526 09/23/17 0830 09/24/17 0530 09/25/17 0443 09/26/17 0835  WBC 11.9*  --  10.5 11.3* 9.5 9.0 10.1  NEUTROABS 9.7*  --   --   --  7.2  --   --   HGB 9.7*   < > 9.4* 8.4* 8.5* 8.9* 8.2*  HCT 31.2*   < > 29.3* 26.5*  27.4* 29.0* 26.7*  MCV 90.2  --  88.8 88.9 89.3 91.5 90.5  PLT 332  --  293 297 278 278 259   < > = values in this interval not displayed.   Blood Culture    Component Value Date/Time   SDES  09/22/2017 0138    BLOOD RIGHT ANTECUBITAL Performed at [facility] [address], [place], [place] [address]    SPECREQUEST  09/22/2017 0138    BOTTLES DRAWN AEROBIC AND ANAEROBIC Blood Culture results may not be optimal due to an excessive volume of blood received in culture bottles Performed at [facility], [place] [address], [place], [place] [address]    CULT  09/22/2017 0138    NO GROWTH 3 DAYS Performed at [facility] [address], [place], [place] [address]    REPTSTATUS PENDING 09/22/2017 0138    Cardiac Enzymes: No results for input(s): CKTOTAL, CKMB, CKMBINDEX, TROPONINI in the last 168 hours. CBG: Recent Labs  Lab 09/24/17 2028 09/25/17 0902 09/25/17 1148 09/25/17 1626 09/25/17 2026  GLUCAP 186* 304* 290* 270* 320*   Iron Studies: No results for input(s): IRON, TIBC, TRANSFERRIN, FERRITIN in the last 72 hours. Lab Results  Component Value Date   INR 1.14 06/17/2017   INR 1.20 06/11/2017   INR 1.19 05/09/2017   Medications: . sodium chloride    . sodium chloride     . amLODipine  10 mg Oral QPC supper  . amoxicillin-clavulanate  500 mg Oral Q24H  . atorvastatin  40 mg Oral q1800  . cinacalcet  60 mg Oral Q supper  . cloNIDine  0.3 mg Oral TID  . doxercalciferol  1 mcg Intravenous Q M,W,F-HD  . doxycycline  100 mg Oral Q12H  . ferric citrate  420 mg Oral TID WC  . gabapentin  100 mg Oral BID  . heparin injection (subcutaneous)  5,000 Units Subcutaneous Q8H  . hydrALAZINE  100 mg Oral Q8H  . insulin aspart  0-15 Units Subcutaneous TID WC  . insulin aspart  0-5 Units Subcutaneous QHS  . insulin glargine  45 Units Subcutaneous Daily  . isosorbide mononitrate  120 mg Oral Once per day on Sun Tue Thu Sat  .  labetalol  300 mg Oral TID  . metoCLOPramide  2.5 mg Oral TID AC  . multivitamin  1 tablet Oral QHS  . pantoprazole  40 mg Oral Daily  . sertraline  50 mg Oral Daily  . sevelamer carbonate  2,400 mg Oral TID WC

## 2017-09-26 NOTE — Care Management Note (Signed)
Case Management Note  Patient Details  Name: NATALEIGH GRIFFIN MRN: [id number] Date of Birth: 07-Jan-1976  Subjective/Objective:     Admitted for Cellulitis and abscess of buttock.         Action/Plan: In to speak with patient.  Discussed attending recommendations of home health RN services, patient offered choice and selected [facility].  Referral for home health RN called to [facility] Liaison with [facility].  Patient denies any DME needs.  Prior to admission patient lived at home with Spouse.  Will be returning to same living situation at discharge.  At discharge patient has transportation home.  Patient is able to afford medication and food.  Has transportation to medical appointments.    Expected Discharge Date:  09/26/17               Expected Discharge Plan:  [place]  In-House Referral:   [name]  Discharge planning Services  CM Consult  Post Acute Care Choice:  Home Health Choice offered to:  Patient  DME Arranged:   [name] DME Agency:   [name]  HH Arranged:  RN [place] Agency:  [facility]  Status of Service:  Completed, signed off  Kristen Cardinal, RN 09/26/2017, 11:35 AM

## 2017-09-26 NOTE — Progress Notes (Signed)
Patient discharged to home, AVS reviewed, prescriptions provided, IV removed, telebox returned. Patient left floor via wheelchair with staff member and husband

## 2017-09-26 NOTE — Consult Note (Signed)
   [facility] CM Inpatient Consult   09/26/2017  KATLIN BORTNER [birth year] [phone]  Patient is currently active with [facility] for chronic disease management services.  Patient has been engaged by a [employer]. Patient has a high risk score for unplanned readmission. Her primary care provider is [facility]. This office is listed to provide the transition of care follow up.  Our community based plan of care has focused on disease management and community resource support.  Patient will receive a post discharge follow up and will be evaluated for monthly home visits for assessments and disease process education.  Met with patient at the bedside and husband.  They consent to ongoing services  There is an active consent on file. Admitted with wound abscess of the buttock.  Made Inpatient Case Manager, Maudie Mercury, aware that [facility] [facility] Management following. Of note, [facility] Care Management services does not replace or interfere with any services that are needed or arranged by inpatient case management or social work.  For additional questions or referrals please contact:  Natividad Brood, RN BSN [facility] Liaison  [phone] business mobile phone Toll free office [phone]

## 2017-09-27 DIAGNOSIS — T8789 Other complications of amputation stump: Secondary | ICD-10-CM | POA: Diagnosis not present

## 2017-09-27 DIAGNOSIS — E1122 Type 2 diabetes mellitus with diabetic chronic kidney disease: Secondary | ICD-10-CM | POA: Diagnosis not present

## 2017-09-27 DIAGNOSIS — I132 Hypertensive heart and chronic kidney disease with heart failure and with stage 5 chronic kidney disease, or end stage renal disease: Secondary | ICD-10-CM | POA: Diagnosis not present

## 2017-09-27 DIAGNOSIS — I5032 Chronic diastolic (congestive) heart failure: Secondary | ICD-10-CM | POA: Diagnosis not present

## 2017-09-27 DIAGNOSIS — L89154 Pressure ulcer of sacral region, stage 4: Secondary | ICD-10-CM | POA: Diagnosis not present

## 2017-09-27 DIAGNOSIS — I2510 Atherosclerotic heart disease of native coronary artery without angina pectoris: Secondary | ICD-10-CM | POA: Diagnosis not present

## 2017-09-27 LAB — CULTURE, BLOOD (ROUTINE X 2)
Culture: NO GROWTH
Culture: NO GROWTH
SPECIAL REQUESTS: ADEQUATE

## 2017-09-29 ENCOUNTER — Encounter: Payer: Self-pay | Admitting: Surgery

## 2017-09-29 ENCOUNTER — Ambulatory Visit (INDEPENDENT_AMBULATORY_CARE_PROVIDER_SITE_OTHER): Payer: Medicare Other | Admitting: Surgery

## 2017-09-29 ENCOUNTER — Other Ambulatory Visit: Payer: Self-pay

## 2017-09-29 VITALS — BP 156/81 | HR 81 | Temp 97.7°F | Resp 14 | Ht 76.0 in | Wt 170.0 lb

## 2017-09-29 DIAGNOSIS — D649 Anemia, unspecified: Secondary | ICD-10-CM | POA: Diagnosis not present

## 2017-09-29 DIAGNOSIS — N186 End stage renal disease: Secondary | ICD-10-CM | POA: Diagnosis not present

## 2017-09-29 DIAGNOSIS — I2510 Atherosclerotic heart disease of native coronary artery without angina pectoris: Secondary | ICD-10-CM | POA: Diagnosis not present

## 2017-09-29 DIAGNOSIS — Z89511 Acquired absence of right leg below knee: Secondary | ICD-10-CM

## 2017-09-29 DIAGNOSIS — N2581 Secondary hyperparathyroidism of renal origin: Secondary | ICD-10-CM | POA: Diagnosis not present

## 2017-09-29 DIAGNOSIS — E8770 Fluid overload, unspecified: Secondary | ICD-10-CM | POA: Diagnosis not present

## 2017-09-29 NOTE — Progress Notes (Signed)
History of Present Illness:  Patient is a 42 y.o. year old female who presents for evaluation of right BKA stump.  She has a wound care nurse come to her home once a week for checks.  They recommend silver alginate and collagen wound care daily.    S/P  right CFA, profunda, SFA and external iliac endarterectomy with bovine patch angioplasty on 04/16/17 and during that time, she became unstable and received CPR &was transferred to the ICU.  Once she became more stable, she underwent a right TMA and exploration of her groin with evacuation of seroma and wound vac placement on 05/09/17.   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    She denise fever or chills.  Her appetite has improved and she appears healthy.            Past Medical History:  Diagnosis Date  . Arthritis   . CHF (congestive heart failure) ([place])   . Chronic anemia    2nd to renal disease  . CIN III (cervical intraepithelial neoplasia grade III) with severe dysplasia    S/P LEEP AND CONE  . Coronary artery disease    Status post cardiac catheterization June 2012 scattered coronary artery disease/atherosclerosis with 70-80% stenosis in a small right PDA.  . Diabetic Charcot's joint disease ([place])   . DM (diabetes mellitus) ([place])    Long-term insulin  . End stage renal disease on dialysis [facility]) 05/02/11   "[facility]"; [facility]; M; W, F ("got it 04/15/2017 because of Thanksgiving")  . Fracture of 5th metatarsal 2016   Right  . Gastroparesis   . Heart murmur   . 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 History of abscesses in groins 12/06/2010  . Hyperlipidemia    Hypertriglyceridemia 449 HDL 25  . Hypertension   . Migraines    "just on dialysis days"  . Peripheral neuropathy    related to DM  . Peripheral vascular disease ([place])    Tibial occlusive disease evaluated by Dr. Kellie Simmering in August 2011. Medical therapy  . Renal insufficiency    Dialysis since 2012  . Seizures ([place])    "only when her sugar  drops" (04/17/2017)  . Tobacco use disorder    Discontinued March 2012  . Trimalleolar fracture of ankle, closed 02/09/2015   Right    Past Surgical History:  Procedure Laterality Date  . ABDOMINAL AORTOGRAM W/LOWER EXTREMITY [name] 03/27/2017   Procedure: ABDOMINAL AORTOGRAM W/LOWER EXTREMITY;  Surgeon: Conrad Hancock, MD;  Location: [place] [facility];  Service: Cardiovascular;  Laterality: [name];  . AMPUTATION Right 05/09/2017   Procedure: AMPUTATION TRANSMETATARSAL OF RIGHT FOOT;  Surgeon: Serafina Mitchell, MD;  Location: [place];  Service: Vascular;  Laterality: Right;  . AMPUTATION Right 06/17/2017   Procedure: AMPUTATION BELOW KNEE RIGHT;  Surgeon: Serafina Mitchell, MD;  [facility];  Service: Vascular;  Laterality: Right;  . AV FISTULA PLACEMENT Left 04/2010   "lower arm"  . CARDIAC CATHETERIZATION [name] 02/10/2015   Procedure: Left Heart Cath and Coronary Angiography;  Surgeon: Dixie Dials, MD;  Location: [place] [facility];  Service: Cardiovascular;  Laterality: [name];  . CARDIAC CATHETERIZATION [name] 02/21/2016   Procedure: Right Heart Cath;  Surgeon: Jolaine Artist, MD;  Location: [place] [facility];  Service: Cardiovascular;  Laterality: [name];  . CATARACT EXTRACTION W/ INTRAOCULAR LENS  IMPLANT, BILATERAL Bilateral   . CERVICAL BIOPSY  W/ LOOP ELECTRODE EXCISION  h/o  . CERVICAL CONE BIOPSY     h/o  . COLPOSCOPY    . DILATION AND CURETTAGE OF UTERUS  2009  . ENDARTERECTOMY FEMORAL Right 04/16/2017   Procedure: RIGHT FEMORAL ENDARTERECTOMY;  Surgeon: Serafina Mitchell, MD;  Location: [place];  Service: Vascular;  Laterality: Right;  . FRACTURE SURGERY    . HERNIA REPAIR    . INCISION AND DRAINAGE ABSCESS     "groin"  . INSERTION OF DIALYSIS CATHETER Left 04/16/2017   Procedure: INSERTION of temporay DIALYSIS CATHETER, left femoral artery;  Surgeon: Serafina Mitchell, MD;  Location: [place];  Service: Vascular;  Laterality: Left;  . IR HYBRID TRAUMA EMBOLIZATION  04/16/2017  . IR  PARACENTESIS  06/12/2017  . IR PARACENTESIS  06/24/2017  . IR PARACENTESIS  07/01/2017  . IR PARACENTESIS  07/10/2017  . IR PARACENTESIS  08/29/2017  . IR PARACENTESIS  09/18/2017  . IR PARACENTESIS  09/24/2017  . ORIF ANKLE FRACTURE Right 02/16/2015   Procedure: OPEN REDUCTION INTERNAL FIXATION (ORIF) RIGHT ANKLE FRACTURE;  Surgeon: Altamese Skagit, MD;  Location: [place];  Service: Orthopedics;  Laterality: Right;  . PATCH ANGIOPLASTY Right 04/16/2017   Procedure: PATCH ANGIOPLASTY OF RIGHT FEMORAL ARTERY USING BOVINE PERICARDIUM PATCH;  Surgeon: Serafina Mitchell, MD;  [facility];  Service: Vascular;  Laterality: Right;  . UMBILICAL HERNIA REPAIR  X 2  . WOUND EXPLORATION Right 05/09/2017   Procedure: EXPLORATION RIGHT GROIN;  Surgeon: Serafina Mitchell, MD;  [facility];  Service: Vascular;  Laterality: Right;    ROS:   General:  No weight loss, Fever, chills  HEENT: No recent headaches, no nasal bleeding, no visual changes, no sore throat  Neurologic: No dizziness, blackouts, seizures. No recent symptoms of stroke or mini- stroke. No recent episodes of slurred speech, or temporary blindness.  Cardiac: No recent episodes of chest pain/pressure, no shortness of breath at rest.  No shortness of breath with exertion.  Denies history of atrial fibrillation or irregular heartbeat  Vascular: No history of rest pain in feet.  No history of claudication.  No history of non-healing ulcer, No history of DVT   Pulmonary: No home oxygen, no productive cough, no hemoptysis,  No asthma or wheezing  Musculoskeletal:  [ ]  Arthritis, [ ]  Low back pain,  [ ]  Joint pain  Hematologic:No history of hypercoagulable state.  No history of easy bleeding.  No history of anemia  Gastrointestinal: No hematochezia or melena,  No gastroesophageal reflux, no trouble swallowing  Urinary: [ ]  chronic Kidney disease, [ ]  on HD - [ ]  MWF or [ ]  TTHS, [ ]  Burning with urination, [ ]  Frequent urination, [ ]  Difficulty  urinating;   Skin: No rashes  Psychological: No history of anxiety,  No history of depression  Social History Social History   Tobacco Use  . Smoking status: Former Smoker    Packs/day: 0.30    Years: 10.00    Pack years: 3.00    Types: Cigarettes    Last attempt to quit: 08/05/2011    Years since quitting: 6.1  . Smokeless tobacco: Never Used  Substance Use Topics  . Alcohol use: No  . Drug use: No    Family History Family History  Problem Relation Age of Onset  . Diabetes Mother   . Hypertension Mother   . Diabetes Father   . Hyperlipidemia Father   . Hypertension Father   . Diabetes Sister   . Stroke Maternal Grandmother   .  Diabetes Sister   . Breast cancer Maternal Aunt        Age 87's    Allergies  Allergies  Allergen Reactions  . Cephalexin Other (See Comments)    Reaction unknown- Childhood allergy Tolerated Ceftriaxone, ceftazidime, zosyn, augmentin in the past  . Sulfamethoxazole-Trimethoprim Other (See Comments)    Unknown reaction. Pt states that she was told by her mother that she had allergy to Bactrim as a child.     Current Outpatient Medications  Medication Sig Dispense Refill  . acetaminophen (TYLENOL) 325 MG tablet Take 1-2 tablets (325-650 mg total) by mouth every 4 (four) hours as needed for mild pain.    Marland Kitchen amLODipine (NORVASC) 10 MG tablet Take 1 tablet (10 mg total) by mouth daily after supper. 30 tablet 0  . amoxicillin-clavulanate (AUGMENTIN) 500-125 MG tablet Take 1 tablet (500 mg total) by mouth daily. For 7days 7 tablet 0  . atorvastatin (LIPITOR) 40 MG tablet Take 1 tablet (40 mg total) by mouth daily at 6 PM. 30 tablet 0  . b complex-vitamin c-folic acid (NEPHRO-VITE) 0.8 MG TABS tablet Take 1 tablet by mouth daily.    . cinacalcet (SENSIPAR) 30 MG tablet Take 2 tablets (60 mg total) by mouth daily with supper. 60 tablet 0  . cloNIDine (CATAPRES) 0.3 MG tablet Take 1 tablet (0.3 mg total) by mouth 3 (three) times daily. 90 tablet  0  . doxycycline (VIBRA-TABS) 100 MG tablet Take 1 tablet (100 mg total) by mouth every 12 (twelve) hours. For 7days 14 tablet 0  . ferric citrate (AURYXIA) 1 GM 210 MG(Fe) tablet Take 2 tablets (420 mg total) by mouth 3 (three) times daily with meals. 270 tablet 0  . gabapentin (NEURONTIN) 100 MG capsule Take 1 capsule (100 mg total) by mouth 2 (two) times daily. 60 capsule 0  . hydrALAZINE (APRESOLINE) 100 MG tablet Take 1 tablet (100 mg total) by mouth every 8 (eight) hours. 90 tablet 1  . Insulin Glargine (LANTUS SOLOSTAR) 100 UNIT/ML Solostar Pen Inject 20 Units into the skin daily at 10 pm. 15 mL 2  . isosorbide mononitrate (IMDUR) 120 MG 24 hr tablet Take 120 mg by mouth daily. (does no take on MWF)  2  . labetalol (NORMODYNE) 300 MG tablet Take 1 tablet (300 mg total) by mouth 3 (three) times daily. 90 tablet 0  . LORazepam (ATIVAN) 0.5 MG tablet Take 1 tablet (0.5 mg total) by mouth every 12 (twelve) hours as needed for anxiety. 30 tablet 0  . metoCLOPramide (REGLAN) 5 MG tablet Take 0.5 tablets (2.5 mg total) by mouth 3 (three) times daily before meals. 45 tablet 0  . pantoprazole (PROTONIX) 40 MG tablet Take 1 tablet (40 mg total) by mouth daily. 30 tablet 0  . sertraline (ZOLOFT) 50 MG tablet Take 1 tablet (50 mg total) by mouth daily. 30 tablet 0  . sevelamer carbonate (RENVELA) 800 MG tablet Take 3 tablets (2,400 mg total) by mouth 3 (three) times daily with meals. Give 3 tablets to = 2400 mg TID 270 tablet 0   No current facility-administered medications for this visit.     Physical Examination  Vitals:   09/29/17 1457 09/29/17 1501  BP: (!) 144/86 (!) 156/81  Pulse: 81 81  Resp: 14   Temp: 97.7 F (36.5 C)   TempSrc: Oral   SpO2: 97%   Weight: 170 lb (77.1 kg)   Height: 6\' 4"  (1.93 m)     Body mass index is 20.69  kg/m.  General:  Alert and oriented, no acute distress HEENT: Normal Neck: No bruit or JVD Pulmonary: Clear to auscultation bilaterally Cardiac:  Regular Rate and Rhythm without murmur Abdomen: Soft, non-tender, non-distended, no mass, no scars Skin: No rash Extremity Pulses:  2+ radial, brachial, femoral pulses right LE.  The right BKA stump is warm to touch.  Palpable thrill left forearm fistula.   Post debridement in office today.  Medially the wound bed has yellow eschar.  Musculoskeletal: right BKA  Neurologic: Upper and lower extremity motor grossly intact and symmetric     ASSESSMENT:  Right BKA with wound healing delay.     PLAN:  After debridement in the office today a dry dressing was applied with silver alginate.  She tolerated the debridement well.  They will continue home health nurse visits once weekly for wound check and do daily dressing changes at home.  We will have her f/u in 1 month for a wound check and possible debridement.    She has a well working left forearm fistula and is at dialysis M-W-F without concerns at this time.     Roxy Horseman PA-C [facility]  Patient was seen in conjunction with Dr. Trula Slade today  Wound was debrided today.  I will continue with dressing changes and have her foot reevaluated in 1 month.  We again discussed the possibility of a more proximal revision however she is not interested in that at this time.  Wells problem

## 2017-09-29 NOTE — Patient Outreach (Signed)
[facility]) Care Management  09/29/2017  [name] 18-Jul-1975 [id number]   Assessment: 44 year oldwith history of ESRD/HD, sacral osteomyelitis, HTN, DM, heart failure, recurrent abdominal ascites, UTI, falls, brain mass. Per chart Admission 1/16-1/28 (right BKA on 06/17/17). Disposition rehabilitation and discharged to home on 07/10/17. Most recent admission 4/29-09/26/17 with cellulitis Left buttocks and ulceration to left heel, increased blood glucose.  RNCM called to complete transition of care. No answer. HIPPA compliant message left.  Plan: send outreach letter, continue to attempt to reach client.  Thea Silversmith, RN, MSN, [place] Coordinator Cell: [phone]

## 2017-09-29 NOTE — Discharge Summary (Signed)
Physician Discharge Summary  [name] YDX:[id number] DOB: 09-02-1975 DOA: 09/22/2017  PCP: Merrilee Seashore, MD  Admit date: 09/22/2017 Discharge date: 09/26/2017  Time spent: 35 minutes  Recommendations for Outpatient Follow-up:  1. PCP in 1 week   Discharge Diagnoses:  Principal Problem:   Cellulitis and abscess of buttock Active Problems:   Essential hypertension, benign   End stage renal disease on dialysis [facility])   PAD (peripheral artery disease) (HCC)   Uncontrolled diabetes mellitus with end-stage renal disease (HCC)   Stage IV pressure ulcer of sacral region ([place])   Anemia of chronic disease   DKA (diabetic ketoacidoses) ([place])   Discharge Condition: stable  Diet recommendation: Renal  Filed Weights   09/24/17 2222 09/26/17 0655 09/26/17 1112  Weight: 82.6 kg (182 lb 1.6 oz) 81 kg (178 lb 9.2 oz) 77.5 kg (170 lb 13.7 oz)    History of present illness:  42 year old female with past medical history of end  stage renal disease on dialysis MWF, CHF with ascites, diabetes mellitus type 2, anemia, hypertension who also had a cardiac arrest in November 2018 during vascular procedure presented to the emergency department for worsening pain and swelling on her left buttock area.  Patient also had developed ulceration on her left heel.  Patient also has a chronic sacral  decubitus ulcer.  CT scan on presentation showed concerning for phlegmon versus early abscess on the left buttock .She was also found to have. hyperglycemia with anion gap of 19.  She was admitted for further management of left buttock abscess, DKA and ongoing dialysis  Hospital Course:   Cellulitis of the left buttock with abscess -clinically improving on IV vancomycin and Zosyn -General surgery consulted, Status post bedside debridement 4/30 -Blood cultures negative thus far -changed IV vancomycin and Zosyn to doxycycline and Augmentin -Continue wound care, air  [place] -home with [facility] for wound  care -advised to continue sitz baths 3-4times per day -FU with PCP in 1 Week and CCS as needed  Uncontrolled diabetes mellitus type 2/DKA on presentation:  -required insulin drip on admission, now resolved,  -continue Lantus and sliding-scale insulin -increased dose  ESRD on dialysis:  -Nephrology following. -HD per renal  Chronic diastolic CHF/ chronic ascites -Requiring intermittent paracentesis, Last done 2 weeks ago, massive fluid thrill noted on exam -s/p repeat paracentesis this admission -Last echo in 03/2017 with grade 2 diastolic dysfunction and preserved EF  Hypertension: BP elevated .  She is on amlodipine, clonidine, hydralazine, Imdur, labetalol at home which we was continued  Anemia of chronic disease -Stable, continue EPO with HD  History of peripheral artery disease:  -Status post right BKA.  Continue  Lipitor.  Consultants: nephrology, surgery  Procedures: Dialysis    Discharge Exam: Vitals:   09/26/17 1112 09/26/17 1130  BP: (!) 170/70 (!) 164/73  Pulse: 70 70  Resp: 18 18  Temp: 98 F (36.7 C) 97.7 F (36.5 C)  SpO2:  94%    General: AAOx3 Cardiovascular: S1S2/RRR Respiratory: CTAB  Discharge Instructions   Discharge Instructions    Discharge instructions   Complete by:  As directed    Renal Diabetic diet   Increase activity slowly   Complete by:  As directed      Allergies as of 09/26/2017      Reactions   Cephalexin Other (See Comments)   Reaction unknown- Childhood allergy Tolerated Ceftriaxone, ceftazidime, zosyn, augmentin in the past   Sulfamethoxazole-trimethoprim Other (See Comments)   Unknown reaction. Pt states that  she was told by her mother that she had allergy to Bactrim as a child.      Medication List    STOP taking these medications   cefTAZidime 2 g in sodium chloride 0.9 % 100 mL   isosorbide dinitrate 30 MG tablet Commonly known as:  ISORDIL   vancomycin 1-5 GM/200ML-% Soln Commonly known as:   VANCOCIN     TAKE these medications   acetaminophen 325 MG tablet Commonly known as:  TYLENOL Take 1-2 tablets (325-650 mg total) by mouth every 4 (four) hours as needed for mild pain.   amLODipine 10 MG tablet Commonly known as:  NORVASC Take 1 tablet (10 mg total) by mouth daily after supper.   amoxicillin-clavulanate 500-125 MG tablet Commonly known as:  AUGMENTIN Take 1 tablet (500 mg total) by mouth daily. For 7days   atorvastatin 40 MG tablet Commonly known as:  LIPITOR Take 1 tablet (40 mg total) by mouth daily at 6 PM.   b complex-vitamin c-folic acid 0.8 MG Tabs tablet Take 1 tablet by mouth daily.   cinacalcet 30 MG tablet Commonly known as:  SENSIPAR Take 2 tablets (60 mg total) by mouth daily with supper.   cloNIDine 0.3 MG tablet Commonly known as:  CATAPRES Take 1 tablet (0.3 mg total) by mouth 3 (three) times daily.   doxycycline 100 MG tablet Commonly known as:  VIBRA-TABS Take 1 tablet (100 mg total) by mouth every 12 (twelve) hours. For 7days   ferric citrate 1 GM 210 MG(Fe) tablet Commonly known as:  AURYXIA Take 2 tablets (420 mg total) by mouth 3 (three) times daily with meals.   gabapentin 100 MG capsule Commonly known as:  NEURONTIN Take 1 capsule (100 mg total) by mouth 2 (two) times daily.   hydrALAZINE 100 MG tablet Commonly known as:  APRESOLINE Take 1 tablet (100 mg total) by mouth every 8 (eight) hours.   Insulin Glargine 100 UNIT/ML Solostar Pen Commonly known as:  LANTUS SOLOSTAR Inject 20 Units into the skin daily at 10 pm. What changed:  how much to take   isosorbide mononitrate 120 MG 24 hr tablet Commonly known as:  IMDUR Take 120 mg by mouth daily. (does no take on MWF)   labetalol 300 MG tablet Commonly known as:  NORMODYNE Take 1 tablet (300 mg total) by mouth 3 (three) times daily.   LORazepam 0.5 MG tablet Commonly known as:  ATIVAN Take 1 tablet (0.5 mg total) by mouth every 12 (twelve) hours as needed for  anxiety.   metoCLOPramide 5 MG tablet Commonly known as:  REGLAN Take 0.5 tablets (2.5 mg total) by mouth 3 (three) times daily before meals.   pantoprazole 40 MG tablet Commonly known as:  PROTONIX Take 1 tablet (40 mg total) by mouth daily.   sertraline 50 MG tablet Commonly known as:  ZOLOFT Take 1 tablet (50 mg total) by mouth daily.   sevelamer carbonate 800 MG tablet Commonly known as:  RENVELA Take 3 tablets (2,400 mg total) by mouth 3 (three) times daily with meals. Give 3 tablets to = 2400 mg TID      Allergies  Allergen Reactions  . Cephalexin Other (See Comments)    Reaction unknown- Childhood allergy Tolerated Ceftriaxone, ceftazidime, zosyn, augmentin in the past  . Sulfamethoxazole-Trimethoprim Other (See Comments)    Unknown reaction. Pt states that she was told by her mother that she had allergy to Bactrim as a child.   Follow-up Information    Ashby Dawes,  Ajith, MD. Schedule an appointment as soon as possible for a visit in 1 week(s).   Specialty:  Internal Medicine Contact information: [address] [place] [address] [phone]        [facility] Follow up.   Specialty:  [place] Why:  They will call you to set up initial visit Contact information: [place] [address] [phone]            The results of significant diagnostics from this hospitalization (including imaging, microbiology, ancillary and laboratory) are listed below for reference.    Significant Diagnostic Studies: Ct Pelvis W Contrast  Result Date: 09/22/2017 CLINICAL DATA:  Initial evaluation for pelvic abscess. EXAM: CT PELVIS WITH CONTRAST TECHNIQUE: Multidetector CT imaging of the pelvis was performed using the standard protocol following the bolus administration of intravenous contrast. CONTRAST:  30mL ISOVUE-300 IOPAMIDOL (ISOVUE-300) INJECTION 61% COMPARISON:  Prior CT from 06/11/2017. FINDINGS: Urinary  Tract: No obvious hydroureter. Bladder largely decompressed. Diffuse circumferential bladder wall thickening likely related incomplete distension. Bowel: No acute abnormality seen about the visualized bowels. Bowels are somewhat centralized secondary to large volume ascites. Vascular/Lymphatic: Extensive atherosclerotic change seen throughout the visualized vasculature. No obvious vascular abnormality. Prominent lymph nodes noted within the bilateral inguinal regions, measuring up to 13 mm on the right and 17 mm on the left. No other discernible adenopathy. Reproductive: Extensive vascular calcifications about the uterus and ovaries. Otherwise unremarkable. Other: Large volume ascites within the visualized abdomen and pelvis. No free intraperitoneal air. Musculoskeletal: Diffuse anasarca. There is a more focal area of soft tissue density within the subcutaneous fat at the inferior left gluteal region measuring approximately 3.5 x 4.1 x 2.6 cm, suspicious for phlegmon and/or early abscess (series 3, image 141). No other discrete collections identified. Sacral decubitus ulcer with the erosion of the underlying distal sacrum/coccyx noted. No loculated collection within this region. No other acute osseous abnormality. No worrisome lytic or blastic osseous lesions. Probable superimposed injection sites noted within the subcutaneous fat of the anterior abdomen. IMPRESSION: 1. 3.5 x 4.1 x 2.6 cm soft tissue lesion within the subcutaneous fat of the inferior left gluteal region, suspicious for phlegmon and/or early abscess. Correlation with physical exam recommended. 2. Sacral decubitus ulcer with erosive changes within the underlying sacrum/coccyx. 3. Large volume ascites with diffuse anasarca. 4. Prominent bilateral inguinal adenopathy as above, nonspecific, but may be reactive in nature. Electronically Signed   By: Jeannine Boga M.D.   On: 09/22/2017 03:32   US Venous Img Lower Unilateral Left  Result Date:  09/02/2017 CLINICAL DATA:  Left leg swelling and injury. EXAM: LEFT LOWER EXTREMITY VENOUS DOPPLER ULTRASOUND TECHNIQUE: Gray-scale sonography with graded compression, as well as color Doppler and duplex ultrasound were performed to evaluate the lower extremity deep venous systems from the level of the common femoral vein and including the common femoral, femoral, profunda femoral, popliteal and calf veins including the posterior tibial, peroneal and gastrocnemius veins when visible. The superficial great saphenous vein was also interrogated. Spectral Doppler was utilized to evaluate flow at rest and with distal augmentation maneuvers in the common femoral, femoral and popliteal veins. COMPARISON:  None. FINDINGS: Contralateral Common Femoral Vein: Not visualized because the patient was unable to complete the examination. Common Femoral Vein: No evidence of thrombus. Normal compressibility, respiratory phasicity and response to augmentation. Saphenofemoral Junction: No evidence of thrombus. Normal compressibility. Profunda Femoral Vein: Not visualized. Femoral Vein: Normal compressibility in the proximal and mid  left femoral vein. Limited evaluation of the distal left femoral vein. Popliteal Vein: No evidence of thrombus. Normal compressibility, respiratory phasicity and response to augmentation. Calf Veins: Limited evaluation. Superficial Great Saphenous Vein: No evidence of thrombus. Normal compressibility. Other Findings:  None. IMPRESSION: Limited examination because the patient was unable to tolerate the entire examination. No thrombus in the left common femoral vein and left popliteal vein. Left femoral vein and left calf veins were incompletely evaluated without evidence for a large thrombus. Electronically Signed   By: Markus Daft M.D.   On: 09/02/2017 16:01   Dg Foot Complete Left  Result Date: 09/22/2017 CLINICAL DATA:  Blood blister on the left heel.  Diabetic. EXAM: LEFT FOOT - COMPLETE 3+ VIEW  COMPARISON:  03/24/2017 FINDINGS: Old healed fracture deformity of the distal left fifth metatarsal bone. No evidence of acute fracture or dislocation of the left foot. No focal bone lesion. No cortical erosion or sclerosis. Prominent vascular calcifications. No radiopaque soft tissue foreign bodies. IMPRESSION: No acute bony abnormalities. No evidence to suggest osteomyelitis. Vascular calcifications. Electronically Signed   By: Lucienne Capers M.D.   On: 09/22/2017 02:35   Ir Paracentesis  Result Date: 09/24/2017 INDICATION: Patient with recurrent ascites. Request is made for therapeutic paracentesis. EXAM: ULTRASOUND GUIDED THERAPEUTIC PARACENTESIS MEDICATIONS: 10 mL 2% lidocaine COMPLICATIONS: None immediate. PROCEDURE: Informed written consent was obtained from the patient after a discussion of the risks, benefits and alternatives to treatment. A timeout was performed prior to the initiation of the procedure. Initial ultrasound scanning demonstrates a large amount of ascites within the right lower abdominal quadrant. The right lower abdomen was prepped and draped in the usual sterile fashion. 2% lidocaine was used for local anesthesia. Following this, a 19 gauge, 7-cm, Yueh catheter was introduced. An ultrasound image was saved for documentation purposes. The paracentesis was performed. The catheter was removed and a dressing was applied. The patient tolerated the procedure well without immediate post procedural complication. FINDINGS: A total of approximately 6.7 liters of yellow fluid was removed. IMPRESSION: Successful ultrasound-guided therapeutic paracentesis yielding 6.7 liters of peritoneal fluid. Read by: Brynda Greathouse PA-C Electronically Signed   By: Sandi Mariscal M.D.   On: 09/24/2017 16:30   Ir Paracentesis  Result Date: 09/18/2017 INDICATION: Abdominal distention. Recurrent ascites. Request for therapeutic paracentesis. EXAM: ULTRASOUND GUIDED RIGHT LOWER QUADRANT PARACENTESIS MEDICATIONS:  None. COMPLICATIONS: None immediate. PROCEDURE: Informed written consent was obtained from the patient after a discussion of the risks, benefits and alternatives to treatment. A timeout was performed prior to the initiation of the procedure. Initial ultrasound scanning demonstrates a large amount of ascites within the right lower abdominal quadrant. The right lower abdomen was prepped and draped in the usual sterile fashion. 1% lidocaine with epinephrine was used for local anesthesia. Following this, a 19 gauge, 7-cm, Yueh catheter was introduced. An ultrasound image was saved for documentation purposes. The paracentesis was performed. The catheter was removed and a dressing was applied. The patient tolerated the procedure well without immediate post procedural complication. FINDINGS: A total of approximately 12.6 L of dark amber/blood-tinged fluid was removed. IMPRESSION: Successful ultrasound-guided paracentesis yielding 12.6 liters of peritoneal fluid. Read by: Ascencion Dike PA-C Electronically Signed   By: Lucrezia Europe M.D.   On: 09/18/2017 11:02    Microbiology: Recent Results (from the past 240 hour(s))  Blood culture (routine x 2)     Status: None   Collection Time: 09/22/17  1:26 AM  Result Value Ref Range Status  Specimen Description   Final    BLOOD RIGHT HAND Performed at [facility] [address], [place], [place] [address]    Special Requests   Final    BOTTLES DRAWN AEROBIC AND ANAEROBIC Blood Culture adequate volume Performed at [facility] [address] [place], [place] [address]    Culture   Final    NO GROWTH 5 DAYS Performed at [facility], [place] [address], [place], [place] [address]    Report Status 09/27/2017 FINAL  Final  Blood culture (routine x 2)     Status: None   Collection Time: 09/22/17  1:38 AM  Result Value Ref Range Status   Specimen Description   Final    BLOOD RIGHT ANTECUBITAL Performed at [facility] [address] [place], [place] [address]    Special Requests   Final    BOTTLES DRAWN AEROBIC AND ANAEROBIC Blood Culture results may not be optimal due to an excessive volume of blood received in culture bottles Performed at [facility] [address], [place], [place] [address]    Culture   Final    NO GROWTH 5 DAYS Performed at [facility], [place] [address], [place], [place] [address]    Report Status 09/27/2017 FINAL  Final  MRSA PCR Screening     Status: None   Collection Time: 09/23/17  1:10 AM  Result Value Ref Range Status   MRSA by PCR NEGATIVE NEGATIVE Final    Comment:        The GeneXpert MRSA Assay (FDA approved for NASAL specimens only), is one component of a comprehensive MRSA colonization surveillance program. It is not intended to diagnose MRSA infection nor to guide or monitor treatment for MRSA infections. Performed at [facility], [place] [address] [place], [place] [address]      Labs: Basic Metabolic Panel: Recent Labs  Lab 09/22/17 2048 09/23/17 0830 09/24/17 0530 09/25/17 0443 09/26/17 0836  NA 137 134* 136 137 134*  K 5.8* 5.5* 4.8 4.3 4.8  CL 98* 93* 97* 99* 95*  CO2 20* 21* 23 27 25   GLUCOSE 180* 371* 288* 329* 338*  BUN 78* 76* 47* 33* 49*  CREATININE 8.69* 8.72* 5.77* 4.60* 5.85*  CALCIUM 7.8* 7.5* 7.1* 7.1* 7.1*  PHOS  --  9.2*  --   --  5.5*   Liver Function Tests: Recent Labs  Lab 09/23/17 0830 09/26/17 0836  ALBUMIN 1.4* 1.5*   No results for input(s): LIPASE, AMYLASE in the last 168 hours. No results for input(s): AMMONIA in the last 168 hours. CBC: Recent Labs  Lab 09/23/17 0830 09/24/17 0530 09/25/17 0443 09/26/17 0835  WBC 11.3* 9.5 9.0 10.1  NEUTROABS  --  7.2  --   --   HGB 8.4* 8.5* 8.9* 8.2*  HCT 26.5* 27.4* 29.0* 26.7*  MCV 88.9 89.3 91.5 90.5  PLT 297 278 278 259   Cardiac Enzymes: No results for input(s): CKTOTAL, CKMB, CKMBINDEX, TROPONINI in the  last 168 hours. BNP: BNP (last 3 results) No results for input(s): BNP in the last 8760 hours.  ProBNP (last 3 results) No results for input(s): PROBNP in the last 8760 hours.  CBG: Recent Labs  Lab 09/25/17 0902 09/25/17 1148 09/25/17 1626 09/25/17 2026 09/26/17 1128  GLUCAP 304* 290* 270* 320* 241*       Signed:  Domenic Polite MD.  [facility] 09/29/2017, 3:57 PM

## 2017-09-29 NOTE — Progress Notes (Signed)
Vitals:   09/29/17 1457  BP: (!) 144/86  Pulse: 81  Resp: 14  Temp: 97.7 F (36.5 C)  TempSrc: Oral  SpO2: 97%  Weight: 170 lb (77.1 kg)  Height: 6\' 4"  (1.93 m)

## 2017-09-30 ENCOUNTER — Other Ambulatory Visit: Payer: Self-pay

## 2017-09-30 DIAGNOSIS — I2510 Atherosclerotic heart disease of native coronary artery without angina pectoris: Secondary | ICD-10-CM | POA: Diagnosis not present

## 2017-09-30 DIAGNOSIS — I132 Hypertensive heart and chronic kidney disease with heart failure and with stage 5 chronic kidney disease, or end stage renal disease: Secondary | ICD-10-CM | POA: Diagnosis not present

## 2017-09-30 DIAGNOSIS — L89154 Pressure ulcer of sacral region, stage 4: Secondary | ICD-10-CM | POA: Diagnosis not present

## 2017-09-30 DIAGNOSIS — T8789 Other complications of amputation stump: Secondary | ICD-10-CM | POA: Diagnosis not present

## 2017-09-30 DIAGNOSIS — E1122 Type 2 diabetes mellitus with diabetic chronic kidney disease: Secondary | ICD-10-CM | POA: Diagnosis not present

## 2017-09-30 DIAGNOSIS — I5032 Chronic diastolic (congestive) heart failure: Secondary | ICD-10-CM | POA: Diagnosis not present

## 2017-09-30 NOTE — Patient Outreach (Signed)
[facility]) Care Management  09/30/2017  [name] [birth year] [id number]   Assessment: 44 year oldwith history of ESRD/HD, sacralosteomyelitis, HTN, DM, heart failure, recurrent abdominal ascites, UTI, falls, brain mass. Per chart Admission 1/16-1/28 (right BKA on 06/17/17). Disposition rehabilitation and discharged to home on 07/10/17. Most recent admission 4/29-09/26/17 with cellulitis Left buttocks and ulceration to left heel, increased blood glucose.  RNCM called to complete transition of care. No answer. HIPPA compliant message left. Outreach letter sent 09/29/17. This is the second attempt.  Plan: follow up call within 3-4 days.  Thea Silversmith, RN, MSN, [place] Coordinator Cell: [phone]

## 2017-10-01 ENCOUNTER — Encounter (HOSPITAL_COMMUNITY): Payer: Self-pay | Admitting: *Deleted

## 2017-10-01 ENCOUNTER — Other Ambulatory Visit: Payer: Self-pay

## 2017-10-01 DIAGNOSIS — D649 Anemia, unspecified: Secondary | ICD-10-CM | POA: Diagnosis not present

## 2017-10-01 DIAGNOSIS — E8770 Fluid overload, unspecified: Secondary | ICD-10-CM | POA: Diagnosis not present

## 2017-10-01 DIAGNOSIS — N186 End stage renal disease: Secondary | ICD-10-CM | POA: Diagnosis not present

## 2017-10-01 DIAGNOSIS — N2581 Secondary hyperparathyroidism of renal origin: Secondary | ICD-10-CM | POA: Diagnosis not present

## 2017-10-02 ENCOUNTER — Encounter (HOSPITAL_COMMUNITY): Payer: Self-pay | Admitting: Anesthesiology

## 2017-10-02 ENCOUNTER — Encounter (HOSPITAL_COMMUNITY)
Admission: RE | Disposition: A | Discharge status: Home / Self Care | Payer: Self-pay | Source: Ambulatory Visit | Attending: Surgery

## 2017-10-02 ENCOUNTER — Ambulatory Visit (HOSPITAL_COMMUNITY)
Admission: RE | Admit: 2017-10-02 | Discharge: 2017-10-02 | Disposition: A | Discharge status: Home / Self Care | Payer: Medicare Other | Source: Ambulatory Visit | Attending: Surgery | Admitting: Surgery

## 2017-10-02 DIAGNOSIS — E11610 Type 2 diabetes mellitus with diabetic neuropathic arthropathy: Secondary | ICD-10-CM | POA: Diagnosis not present

## 2017-10-02 DIAGNOSIS — Z992 Dependence on renal dialysis: Secondary | ICD-10-CM | POA: Insufficient documentation

## 2017-10-02 DIAGNOSIS — I132 Hypertensive heart and chronic kidney disease with heart failure and with stage 5 chronic kidney disease, or end stage renal disease: Secondary | ICD-10-CM | POA: Diagnosis not present

## 2017-10-02 DIAGNOSIS — D1809 Hemangioma of other sites: Secondary | ICD-10-CM | POA: Diagnosis not present

## 2017-10-02 DIAGNOSIS — E1151 Type 2 diabetes mellitus with diabetic peripheral angiopathy without gangrene: Secondary | ICD-10-CM | POA: Diagnosis not present

## 2017-10-02 DIAGNOSIS — Z87891 Personal history of nicotine dependence: Secondary | ICD-10-CM | POA: Insufficient documentation

## 2017-10-02 DIAGNOSIS — I2510 Atherosclerotic heart disease of native coronary artery without angina pectoris: Secondary | ICD-10-CM | POA: Insufficient documentation

## 2017-10-02 DIAGNOSIS — Z89511 Acquired absence of right leg below knee: Secondary | ICD-10-CM | POA: Insufficient documentation

## 2017-10-02 DIAGNOSIS — Z882 Allergy status to sulfonamides status: Secondary | ICD-10-CM | POA: Insufficient documentation

## 2017-10-02 DIAGNOSIS — Z881 Allergy status to other antibiotic agents status: Secondary | ICD-10-CM | POA: Insufficient documentation

## 2017-10-02 DIAGNOSIS — N186 End stage renal disease: Secondary | ICD-10-CM | POA: Insufficient documentation

## 2017-10-02 DIAGNOSIS — Z01818 Encounter for other preprocedural examination: Secondary | ICD-10-CM | POA: Insufficient documentation

## 2017-10-02 DIAGNOSIS — E1122 Type 2 diabetes mellitus with diabetic chronic kidney disease: Secondary | ICD-10-CM | POA: Insufficient documentation

## 2017-10-02 DIAGNOSIS — Z833 Family history of diabetes mellitus: Secondary | ICD-10-CM | POA: Diagnosis not present

## 2017-10-02 DIAGNOSIS — Z538 Procedure and treatment not carried out for other reasons: Secondary | ICD-10-CM | POA: Diagnosis not present

## 2017-10-02 LAB — GLUCOSE, CAPILLARY: GLUCOSE-CAPILLARY: 463 mg/dL — AB (ref 65–99)

## 2017-10-02 SURGERY — EXCISION, KELOID
Anesthesia: Monitor Anesthesia Care

## 2017-10-02 MED ORDER — CHLORHEXIDINE GLUCONATE CLOTH 2 % EX PADS: 6.0000 | MEDICATED_PAD | Freq: Once | CUTANEOUS | Status: DC

## 2017-10-02 NOTE — H&P (Signed)
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 Prolonged hospital recovery c/b development of sacral decubitus ulcer; she did undergo trach/PEG during that admission but this was all subsequently removed. She was ultimately discharged home and has been recovering relatively well. She has been undergoing large volume paracenteses since then. Her husband has been providing wound care for her decubitus ulcer and has noted dramatic improvement in the ulcer. She is also seeing wound care. She developed a growth in the bed of her wound which was preventing the wound from fully closing. 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 She was subsequently referred to [place] for further evaluation for surgical excision. Her husband and her described issues with wound care related to this. They had difficulty packing into the wound as this lesion is here. It has grown in size over the last 1-2 months.  Since being seen in the office, she was admitted with a perianal abscess that was drained and has recovered well from this. She is completing a course of doxycycline. Denies any complaints today.   Past Medical History:  Diagnosis Date  . Arthritis   . CHF (congestive heart failure) ([place])   . Chronic anemia    2nd to renal disease  . CIN III (cervical intraepithelial neoplasia grade III) with severe dysplasia    S/P LEEP AND CONE  . Coronary artery disease    Status post cardiac catheterization June 2012 scattered coronary artery disease/atherosclerosis with 70-80% stenosis in a small right PDA.  . Diabetic Charcot's joint disease ([place])   . DM (diabetes  mellitus) ([place])    Long-term insulin  . End stage renal disease on dialysis [facility]) 05/02/11   "[facility]"; [facility]; M; W, F ("got it 04/15/2017 because of Thanksgiving")  . Fracture of 5th metatarsal 2016   Right  . Gastroparesis   . Heart murmur   . 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 History of abscesses in groins 12/06/2010  . Hyperlipidemia    Hypertriglyceridemia 449 HDL 25  . Hypertension   . Migraines    "just on dialysis days"  . Peripheral neuropathy    related to DM  . Peripheral vascular disease ([place])    Tibial occlusive disease evaluated by Dr. Kellie Simmering in August 2011. Medical therapy  . Renal insufficiency    Dialysis since 2012  . Seizures ([place])    "only when her sugar drops" (04/17/2017)  . Tobacco use disorder    Discontinued March 2012  . Trimalleolar fracture of ankle, closed 02/09/2015   Right    Past Surgical History:  Procedure Laterality Date  . ABDOMINAL AORTOGRAM W/LOWER EXTREMITY [name] 03/27/2017   Procedure: ABDOMINAL AORTOGRAM W/LOWER EXTREMITY;  Surgeon: Conrad Tenafly, MD;  Location: [place] [facility];  Service: Cardiovascular;  Laterality: [name];  . AMPUTATION Right 05/09/2017   Procedure: AMPUTATION TRANSMETATARSAL OF RIGHT FOOT;  Surgeon: Serafina Mitchell, MD;  Location: [place];  Service: Vascular;  Laterality: Right;  . AMPUTATION Right 06/17/2017   Procedure: AMPUTATION BELOW KNEE RIGHT;  Surgeon: Serafina Mitchell, MD;  [facility];  Service: Vascular;  Laterality: Right;  . AV FISTULA PLACEMENT Left 04/2010   "lower arm"  .  CARDIAC CATHETERIZATION [name] 02/10/2015   Procedure: Left Heart Cath and Coronary Angiography;  Surgeon: Dixie Dials, MD;  Location: [place] [facility];  Service: Cardiovascular;  Laterality: [name];  . CARDIAC CATHETERIZATION [name] 02/21/2016   Procedure: Right Heart Cath;  Surgeon: Jolaine Artist, MD;  Location: [place] CV [facility];  Service: Cardiovascular;  Laterality: [name];  . CATARACT EXTRACTION W/ INTRAOCULAR LENS  IMPLANT, BILATERAL  Bilateral   . CERVICAL BIOPSY  W/ LOOP ELECTRODE EXCISION     h/o  . CERVICAL CONE BIOPSY     h/o  . COLPOSCOPY    . DILATION AND CURETTAGE OF UTERUS  2009  . ENDARTERECTOMY FEMORAL Right 04/16/2017   Procedure: RIGHT FEMORAL ENDARTERECTOMY;  Surgeon: Serafina Mitchell, MD;  Location: [place];  Service: Vascular;  Laterality: Right;  . FRACTURE SURGERY    . HERNIA REPAIR    . INCISION AND DRAINAGE ABSCESS     "groin"  . INSERTION OF DIALYSIS CATHETER Left 04/16/2017   Procedure: INSERTION of temporay DIALYSIS CATHETER, left femoral artery;  Surgeon: Serafina Mitchell, MD;  Location: [place];  Service: Vascular;  Laterality: Left;  . IR HYBRID TRAUMA EMBOLIZATION  04/16/2017  . IR PARACENTESIS  06/12/2017  . IR PARACENTESIS  06/24/2017  . IR PARACENTESIS  07/01/2017  . IR PARACENTESIS  07/10/2017  . IR PARACENTESIS  08/29/2017  . IR PARACENTESIS  09/18/2017  . IR PARACENTESIS  09/24/2017  . ORIF ANKLE FRACTURE Right 02/16/2015   Procedure: OPEN REDUCTION INTERNAL FIXATION (ORIF) RIGHT ANKLE FRACTURE;  Surgeon: Altamese Palermo, MD;  Location: [place];  Service: Orthopedics;  Laterality: Right;  . PATCH ANGIOPLASTY Right 04/16/2017   Procedure: PATCH ANGIOPLASTY OF RIGHT FEMORAL ARTERY USING BOVINE PERICARDIUM PATCH;  Surgeon: Serafina Mitchell, MD;  [facility];  Service: Vascular;  Laterality: Right;  . UMBILICAL HERNIA REPAIR  X 2  . WOUND EXPLORATION Right 05/09/2017   Procedure: EXPLORATION RIGHT GROIN;  Surgeon: Serafina Mitchell, MD;  [facility];  Service: Vascular;  Laterality: Right;    Family History  Problem Relation Age of Onset  . Diabetes Mother   . Hypertension Mother   . Diabetes Father   . Hyperlipidemia Father   . Hypertension Father   . Diabetes Sister   . Stroke Maternal Grandmother   . Diabetes Sister   . Breast cancer Maternal Aunt        Age 35's    Social:  reports that she quit smoking about 6 years ago. Her smoking use included cigarettes. She has a 3.00  pack-year smoking history. She has never used smokeless tobacco. She reports that she does not drink alcohol or use drugs.  Allergies:  Allergies  Allergen Reactions  . Cephalexin Other (See Comments)    Reaction unknown- Childhood allergy Tolerated Ceftriaxone, ceftazidime, zosyn, augmentin in the past  . Sulfamethoxazole-Trimethoprim Other (See Comments)    Unknown reaction. Pt states that she was told by her mother that she had allergy to Bactrim as a child.    Medications: I have reviewed the patient's current medications.  Results for orders placed or performed during the hospital encounter of 10/02/17 (from the past 48 hour(s))  Glucose, capillary     Status: Abnormal   Collection Time: 10/02/17  8:08 AM  Result Value Ref Range   Glucose-Capillary 463 (H) 65 - 99 mg/dL    No results found.  ROS - all of the below systems have been reviewed with the patient and  positives are indicated with bold text General: chills, fever or night sweats Eyes: blurry vision or double vision ENT: epistaxis or sore throat Allergy/Immunology: itchy/watery eyes or nasal congestion Hematologic/Lymphatic: bleeding problems, blood clots or swollen lymph nodes Endocrine: temperature intolerance or unexpected weight changes Breast: new or changing breast lumps or nipple discharge Resp: cough, shortness of breath, or wheezing CV: chest pain or dyspnea on exertion GI: as per HPI GU: dysuria, trouble voiding, or hematuria MSK: joint pain or joint stiffness Neuro: TIA or stroke symptoms Derm: pruritus and skin lesion changes Psych: anxiety and depression  PE Last menstrual period 10/09/2015. Constitutional: NAD; conversant; s/p R BKA Eyes: Moist conjunctiva; no lid lag; anicteric; PERRL Neck: Trachea midline; no thyromegaly Lungs: Normal respiratory effort; no tactile fremitus CV: RRR; no palpable thrills; no pitting edema GI: Abd soft, NT/ND; no palpable hepatosplenomegaly MSK: s/p R  BKA Psychiatric: Appropriate affect; alert and oriented x3 Lymphatic: No palpable cervical or axillary lymphadenopathy  Results for orders placed or performed during the hospital encounter of 10/02/17 (from the past 48 hour(s))  Glucose, capillary     Status: Abnormal   Collection Time: 10/02/17  8:08 AM  Result Value Ref Range   Glucose-Capillary 463 (H) 65 - 99 mg/dL     A/P: [name] is a very pleasant 57yoF with history of hypertension, hyperlipidemia, CHF, ESRD on HD M/W/F, cardiac ascites, PAD, PVD status post right BKA, give by cardiac arrest and prolonged admission. She subsequently developed a sacral decubitus ulcer which has been healing well at home. Her husband has been assisting in care of this. She has subsequently developed a growth in the bed of the wound which wound care is biopsied and results returned ulcerated pyogenic granuloma (lobular capillary hemangioma) no malignancy identified  -Her blood sugar was 436 this morning and her case is being canceled per anesthesia  Sharon Mt. Dema Severin, M.D. [facility]

## 2017-10-02 NOTE — Progress Notes (Signed)
She reports she took her insulin this morning, having forgot to take it last night as she fell asleep. She said her blood sugar will run this high when she forgets her PM insulin. She reports having took her AM insulin 30 minutes ago and "it does this all the time." She is alerting her PCP as well. If it's not coming down appropriately in the next 3-4hrs, I advised she return to the emergency room for further evaluation. She voiced understanding  Sharon Mt. Dema Severin, M.D. [facility]

## 2017-10-03 ENCOUNTER — Other Ambulatory Visit: Payer: Self-pay

## 2017-10-03 DIAGNOSIS — E8770 Fluid overload, unspecified: Secondary | ICD-10-CM | POA: Diagnosis not present

## 2017-10-03 DIAGNOSIS — D649 Anemia, unspecified: Secondary | ICD-10-CM | POA: Diagnosis not present

## 2017-10-03 DIAGNOSIS — N186 End stage renal disease: Secondary | ICD-10-CM | POA: Diagnosis not present

## 2017-10-03 DIAGNOSIS — N2581 Secondary hyperparathyroidism of renal origin: Secondary | ICD-10-CM | POA: Diagnosis not present

## 2017-10-03 NOTE — Patient Outreach (Signed)
[facility]) Care Management  10/03/2017  [name] 1975-08-29 [id number]   Assessment:55 year oldwith history of ESRD/HD, sacralosteomyelitis, HTN, DM, heart failure, recurrent abdominal ascites, UTI, falls, brain mass.  Per chart Admission 1/16-1/28(right BKA on 06/17/17). Disposition rehabilitation and discharged to home on 07/10/17.Most recent admission 4/29-09/26/17 with cellulitis Left buttocks and ulceration to left heel, increased blood glucose.  RNCM called to complete transition of care. No answer. HIPPA compliant message message left. Outreach letter sent on 09/29/17. This is the 3rd outreach call.  Plan: await return call. RNCM will close case if no return call within 10 business days from outreach letter.  Thea Silversmith, RN, MSN, [place] Coordinator Cell: [phone]

## 2017-10-04 DIAGNOSIS — N186 End stage renal disease: Secondary | ICD-10-CM | POA: Diagnosis not present

## 2017-10-04 DIAGNOSIS — E8770 Fluid overload, unspecified: Secondary | ICD-10-CM | POA: Diagnosis not present

## 2017-10-04 DIAGNOSIS — N2581 Secondary hyperparathyroidism of renal origin: Secondary | ICD-10-CM | POA: Diagnosis not present

## 2017-10-04 DIAGNOSIS — D649 Anemia, unspecified: Secondary | ICD-10-CM | POA: Diagnosis not present

## 2017-10-06 DIAGNOSIS — N186 End stage renal disease: Secondary | ICD-10-CM | POA: Diagnosis not present

## 2017-10-06 DIAGNOSIS — N2581 Secondary hyperparathyroidism of renal origin: Secondary | ICD-10-CM | POA: Diagnosis not present

## 2017-10-06 DIAGNOSIS — E8770 Fluid overload, unspecified: Secondary | ICD-10-CM | POA: Diagnosis not present

## 2017-10-06 DIAGNOSIS — D649 Anemia, unspecified: Secondary | ICD-10-CM | POA: Diagnosis not present

## 2017-10-07 DIAGNOSIS — E8770 Fluid overload, unspecified: Secondary | ICD-10-CM | POA: Diagnosis not present

## 2017-10-07 DIAGNOSIS — N186 End stage renal disease: Secondary | ICD-10-CM | POA: Diagnosis not present

## 2017-10-07 DIAGNOSIS — D649 Anemia, unspecified: Secondary | ICD-10-CM | POA: Diagnosis not present

## 2017-10-07 DIAGNOSIS — N2581 Secondary hyperparathyroidism of renal origin: Secondary | ICD-10-CM | POA: Diagnosis not present

## 2017-10-08 ENCOUNTER — Other Ambulatory Visit (HOSPITAL_COMMUNITY): Payer: Self-pay | Admitting: Nephrology

## 2017-10-08 DIAGNOSIS — E8770 Fluid overload, unspecified: Secondary | ICD-10-CM | POA: Diagnosis not present

## 2017-10-08 DIAGNOSIS — N2581 Secondary hyperparathyroidism of renal origin: Secondary | ICD-10-CM | POA: Diagnosis not present

## 2017-10-08 DIAGNOSIS — D649 Anemia, unspecified: Secondary | ICD-10-CM | POA: Diagnosis not present

## 2017-10-08 DIAGNOSIS — N186 End stage renal disease: Secondary | ICD-10-CM | POA: Diagnosis not present

## 2017-10-08 DIAGNOSIS — R188 Other ascites: Secondary | ICD-10-CM

## 2017-10-09 DIAGNOSIS — L0291 Cutaneous abscess, unspecified: Secondary | ICD-10-CM | POA: Diagnosis not present

## 2017-10-09 DIAGNOSIS — K648 Other hemorrhoids: Secondary | ICD-10-CM | POA: Diagnosis not present

## 2017-10-10 ENCOUNTER — Encounter (HOSPITAL_COMMUNITY): Payer: Self-pay | Admitting: Student

## 2017-10-10 ENCOUNTER — Ambulatory Visit (HOSPITAL_COMMUNITY)
Admission: RE | Admit: 2017-10-10 | Discharge: 2017-10-10 | Disposition: A | Discharge status: Home / Self Care | Payer: Medicare Other | Source: Ambulatory Visit | Attending: Nephrology | Admitting: Nephrology

## 2017-10-10 ENCOUNTER — Other Ambulatory Visit: Payer: Self-pay

## 2017-10-10 DIAGNOSIS — D649 Anemia, unspecified: Secondary | ICD-10-CM | POA: Diagnosis not present

## 2017-10-10 DIAGNOSIS — R188 Other ascites: Secondary | ICD-10-CM | POA: Diagnosis not present

## 2017-10-10 DIAGNOSIS — N2581 Secondary hyperparathyroidism of renal origin: Secondary | ICD-10-CM | POA: Diagnosis not present

## 2017-10-10 DIAGNOSIS — E8770 Fluid overload, unspecified: Secondary | ICD-10-CM | POA: Diagnosis not present

## 2017-10-10 DIAGNOSIS — N186 End stage renal disease: Secondary | ICD-10-CM | POA: Diagnosis not present

## 2017-10-10 HISTORY — PX: IR PARACENTESIS: IMG2679

## 2017-10-10 MED ORDER — LIDOCAINE HCL (PF) 1 % IJ SOLN
INTRAMUSCULAR | Status: DC | PRN
  Administered 2017-10-10: 8 mL

## 2017-10-10 MED ORDER — LIDOCAINE HCL (PF) 2 % IJ SOLN
INTRAMUSCULAR | Status: AC
  Filled 2017-10-10: qty 20

## 2017-10-10 NOTE — Patient Outreach (Signed)
[facility]) Care Management  10/10/2017  LAKSHMI SUNDEEN November 07, 1975 [id number]  Case Closure-RNCM has made 3 outreach calls x3 and sent an outreach letter with no response.  Plan: per policy/procedure case will be closed. Update primary care.  Thea Silversmith, RN, MSN, [place] Coordinator Cell: [phone]

## 2017-10-10 NOTE — Procedures (Signed)
PROCEDURE SUMMARY:  Successful US guided paracentesis from right lateral abdomen.  Yielded 5.1 liters of clear, yellow fluid.  No immediate complications.  Pt tolerated well.   Specimen was not sent for labs.  Docia Barrier PA-C 10/10/2017 2:55 PM

## 2017-10-13 DIAGNOSIS — N186 End stage renal disease: Secondary | ICD-10-CM | POA: Diagnosis not present

## 2017-10-13 DIAGNOSIS — D649 Anemia, unspecified: Secondary | ICD-10-CM | POA: Diagnosis not present

## 2017-10-13 DIAGNOSIS — N2581 Secondary hyperparathyroidism of renal origin: Secondary | ICD-10-CM | POA: Diagnosis not present

## 2017-10-13 DIAGNOSIS — E8770 Fluid overload, unspecified: Secondary | ICD-10-CM | POA: Diagnosis not present

## 2017-10-13 NOTE — Telephone Encounter (Signed)
This encounter was created in error - please disregard.

## 2017-10-15 DIAGNOSIS — I2510 Atherosclerotic heart disease of native coronary artery without angina pectoris: Secondary | ICD-10-CM | POA: Diagnosis not present

## 2017-10-15 DIAGNOSIS — D649 Anemia, unspecified: Secondary | ICD-10-CM | POA: Diagnosis not present

## 2017-10-15 DIAGNOSIS — N186 End stage renal disease: Secondary | ICD-10-CM | POA: Diagnosis not present

## 2017-10-15 DIAGNOSIS — I5032 Chronic diastolic (congestive) heart failure: Secondary | ICD-10-CM | POA: Diagnosis not present

## 2017-10-15 DIAGNOSIS — L89154 Pressure ulcer of sacral region, stage 4: Secondary | ICD-10-CM | POA: Diagnosis not present

## 2017-10-15 DIAGNOSIS — E1122 Type 2 diabetes mellitus with diabetic chronic kidney disease: Secondary | ICD-10-CM | POA: Diagnosis not present

## 2017-10-15 DIAGNOSIS — N2581 Secondary hyperparathyroidism of renal origin: Secondary | ICD-10-CM | POA: Diagnosis not present

## 2017-10-15 DIAGNOSIS — E8770 Fluid overload, unspecified: Secondary | ICD-10-CM | POA: Diagnosis not present

## 2017-10-15 DIAGNOSIS — T8789 Other complications of amputation stump: Secondary | ICD-10-CM | POA: Diagnosis not present

## 2017-10-15 DIAGNOSIS — I132 Hypertensive heart and chronic kidney disease with heart failure and with stage 5 chronic kidney disease, or end stage renal disease: Secondary | ICD-10-CM | POA: Diagnosis not present

## 2017-10-17 DIAGNOSIS — E8770 Fluid overload, unspecified: Secondary | ICD-10-CM | POA: Diagnosis not present

## 2017-10-17 DIAGNOSIS — N186 End stage renal disease: Secondary | ICD-10-CM | POA: Diagnosis not present

## 2017-10-17 DIAGNOSIS — N2581 Secondary hyperparathyroidism of renal origin: Secondary | ICD-10-CM | POA: Diagnosis not present

## 2017-10-17 DIAGNOSIS — D649 Anemia, unspecified: Secondary | ICD-10-CM | POA: Diagnosis not present

## 2017-10-20 DIAGNOSIS — E8770 Fluid overload, unspecified: Secondary | ICD-10-CM | POA: Diagnosis not present

## 2017-10-20 DIAGNOSIS — D649 Anemia, unspecified: Secondary | ICD-10-CM | POA: Diagnosis not present

## 2017-10-20 DIAGNOSIS — N186 End stage renal disease: Secondary | ICD-10-CM | POA: Diagnosis not present

## 2017-10-20 DIAGNOSIS — N2581 Secondary hyperparathyroidism of renal origin: Secondary | ICD-10-CM | POA: Diagnosis not present

## 2017-10-22 DIAGNOSIS — H5711 Ocular pain, right eye: Secondary | ICD-10-CM | POA: Diagnosis not present

## 2017-10-22 DIAGNOSIS — N2581 Secondary hyperparathyroidism of renal origin: Secondary | ICD-10-CM | POA: Diagnosis not present

## 2017-10-22 DIAGNOSIS — Z8673 Personal history of transient ischemic attack (TIA), and cerebral infarction without residual deficits: Secondary | ICD-10-CM | POA: Diagnosis not present

## 2017-10-22 DIAGNOSIS — N186 End stage renal disease: Secondary | ICD-10-CM | POA: Diagnosis not present

## 2017-10-22 DIAGNOSIS — Z89431 Acquired absence of right foot: Secondary | ICD-10-CM | POA: Diagnosis not present

## 2017-10-22 DIAGNOSIS — E8770 Fluid overload, unspecified: Secondary | ICD-10-CM | POA: Diagnosis not present

## 2017-10-22 DIAGNOSIS — D649 Anemia, unspecified: Secondary | ICD-10-CM | POA: Diagnosis not present

## 2017-10-22 DIAGNOSIS — H3582 Retinal ischemia: Secondary | ICD-10-CM | POA: Diagnosis not present

## 2017-10-23 ENCOUNTER — Encounter: Payer: Self-pay | Admitting: Physical Medicine & Rehabilitation

## 2017-10-23 ENCOUNTER — Encounter
Payer: BLUE CROSS/BLUE SHIELD | Attending: Physical Medicine & Rehabilitation | Admitting: Physical Medicine & Rehabilitation

## 2017-10-23 VITALS — BP 161/85 | HR 79 | Resp 14 | Ht 76.0 in | Wt 170.0 lb

## 2017-10-23 DIAGNOSIS — N186 End stage renal disease: Secondary | ICD-10-CM | POA: Insufficient documentation

## 2017-10-23 DIAGNOSIS — R269 Unspecified abnormalities of gait and mobility: Secondary | ICD-10-CM | POA: Diagnosis not present

## 2017-10-23 DIAGNOSIS — I132 Hypertensive heart and chronic kidney disease with heart failure and with stage 5 chronic kidney disease, or end stage renal disease: Secondary | ICD-10-CM | POA: Diagnosis not present

## 2017-10-23 DIAGNOSIS — I2510 Atherosclerotic heart disease of native coronary artery without angina pectoris: Secondary | ICD-10-CM

## 2017-10-23 DIAGNOSIS — Z89511 Acquired absence of right leg below knee: Secondary | ICD-10-CM | POA: Diagnosis not present

## 2017-10-23 DIAGNOSIS — L89300 Pressure ulcer of unspecified buttock, unstageable: Secondary | ICD-10-CM

## 2017-10-23 DIAGNOSIS — I509 Heart failure, unspecified: Secondary | ICD-10-CM | POA: Insufficient documentation

## 2017-10-23 DIAGNOSIS — F419 Anxiety disorder, unspecified: Secondary | ICD-10-CM | POA: Insufficient documentation

## 2017-10-23 DIAGNOSIS — Z87891 Personal history of nicotine dependence: Secondary | ICD-10-CM | POA: Insufficient documentation

## 2017-10-23 DIAGNOSIS — R188 Other ascites: Secondary | ICD-10-CM | POA: Diagnosis not present

## 2017-10-23 DIAGNOSIS — E1122 Type 2 diabetes mellitus with diabetic chronic kidney disease: Secondary | ICD-10-CM | POA: Diagnosis not present

## 2017-10-23 DIAGNOSIS — G939 Disorder of brain, unspecified: Secondary | ICD-10-CM | POA: Insufficient documentation

## 2017-10-23 DIAGNOSIS — R531 Weakness: Secondary | ICD-10-CM | POA: Diagnosis not present

## 2017-10-23 DIAGNOSIS — L89329 Pressure ulcer of left buttock, unspecified stage: Secondary | ICD-10-CM | POA: Insufficient documentation

## 2017-10-23 DIAGNOSIS — E781 Pure hyperglyceridemia: Secondary | ICD-10-CM | POA: Diagnosis not present

## 2017-10-23 DIAGNOSIS — Z992 Dependence on renal dialysis: Secondary | ICD-10-CM | POA: Diagnosis not present

## 2017-10-23 DIAGNOSIS — E1143 Type 2 diabetes mellitus with diabetic autonomic (poly)neuropathy: Secondary | ICD-10-CM | POA: Insufficient documentation

## 2017-10-23 DIAGNOSIS — G9389 Other specified disorders of brain: Secondary | ICD-10-CM

## 2017-10-23 NOTE — Progress Notes (Signed)
Subjective:    Patient ID: [name], female    DOB: 01/17/1976, 42 y.o.   MRN: [id number]  HPI 42 y.o. female with histoyr of CAD, ESRD, T2DM, right foot wounds s/p revascularization complicated hospital course 03/2017, sacral decub and RLE ischemia requiring right metatarsal amputation with exploration of right groin wound 04/2017 presents for follow up for right BKA as well as multiple other medical issues.   Last clinic visit 09/11/17.  Since that time, pt went to the ED for cellulitis and had removal of capillary hemangioma.  Notes reviewed. Today, pt states she has not heard from therapies in about 2 months.  She continues to see Vasc surgery. Pt's surgery was postponed due to other medical issues.  BP is elevated. She continues to get periodic paracentesis. She still has not followed up with Neurosurg, states she will in future. Denies falls.  Pain Inventory Average Pain 1 Pain Right Now 0 My pain is intermittent and dull  In the last 24 hours, has pain interfered with the following? General activity 0 Relation with others 0 Enjoyment of life 5 What TIME of day is your pain at its worst? night Sleep (in general) Poor  Pain is worse with: sitting Pain improves with: rest Relief from Meds: 1  Mobility ability to climb steps?  no do you drive?  no use a wheelchair transfers alone  Function disabled: date disabled 2009 I need assistance with the following:  household duties and shopping Do you have any goals in this area?  yes  Neuro/Psych anxiety  Prior Studies Any changes since last visit?  no  Physicians involved in your care Any changes since last visit?  no   Family History  Problem Relation Age of Onset  . Diabetes Mother   . Hypertension Mother   . Diabetes Father   . Hyperlipidemia Father   . Hypertension Father   . Diabetes Sister   . Stroke Maternal Grandmother   . Diabetes Sister   . Breast cancer Maternal Aunt        Age 88's   Social  History   Socioeconomic History  . Marital status: Married    Spouse name: Not on file  . Number of children: Not on file  . Years of education: Not on file  . Highest education level: Not on file  Occupational History    Employer: [employer]  . Financial resource strain: Not on file  . Food insecurity:    Worry: Not on file    Inability: Not on file  . Transportation needs:    Medical: Not on file    Non-medical: Not on file  Tobacco Use  . Smoking status: Former Smoker    Packs/day: 0.30    Years: 10.00    Pack years: 3.00    Types: Cigarettes    Last attempt to quit: 08/05/2011    Years since quitting: 6.2  . Smokeless tobacco: Never Used  Substance and Sexual Activity  . Alcohol use: No  . Drug use: No  . Sexual activity: Not on file  Lifestyle  . Physical activity:    Days per week: Not on file    Minutes per session: Not on file  . Stress: Not on file  Relationships  . Social connections:    Talks on phone: Not on file    Gets together: Not on file    Attends religious service: Not on file    Active member of club  or organization: Not on file    Attends meetings of clubs or organizations: Not on file    Relationship status: Not on file  Other Topics Concern  . Not on file  Social History Narrative  . Not on file   Past Surgical History:  Procedure Laterality Date  . ABDOMINAL AORTOGRAM W/LOWER EXTREMITY [name] 03/27/2017   Procedure: ABDOMINAL AORTOGRAM W/LOWER EXTREMITY;  Surgeon: Conrad Rockledge, MD;  Location: [place] [facility];  Service: Cardiovascular;  Laterality: [name];  . AMPUTATION Right 05/09/2017   Procedure: AMPUTATION TRANSMETATARSAL OF RIGHT FOOT;  Surgeon: Serafina Mitchell, MD;  Location: [place];  Service: Vascular;  Laterality: Right;  . AMPUTATION Right 06/17/2017   Procedure: AMPUTATION BELOW KNEE RIGHT;  Surgeon: Serafina Mitchell, MD;  [facility];  Service: Vascular;  Laterality: Right;  . AV FISTULA PLACEMENT Left 04/2010    "lower arm"  . CARDIAC CATHETERIZATION [name] 02/10/2015   Procedure: Left Heart Cath and Coronary Angiography;  Surgeon: Dixie Dials, MD;  Location: [place] [facility];  Service: Cardiovascular;  Laterality: [name];  . CARDIAC CATHETERIZATION [name] 02/21/2016   Procedure: Right Heart Cath;  Surgeon: Jolaine Artist, MD;  Location: [place] [facility];  Service: Cardiovascular;  Laterality: [name];  . CATARACT EXTRACTION W/ INTRAOCULAR LENS  IMPLANT, BILATERAL Bilateral   . CERVICAL BIOPSY  W/ LOOP ELECTRODE EXCISION     h/o  . CERVICAL CONE BIOPSY     h/o  . COLPOSCOPY    . DILATION AND CURETTAGE OF UTERUS  2009  . ENDARTERECTOMY FEMORAL Right 04/16/2017   Procedure: RIGHT FEMORAL ENDARTERECTOMY;  Surgeon: Serafina Mitchell, MD;  Location: [place];  Service: Vascular;  Laterality: Right;  . FRACTURE SURGERY    . HERNIA REPAIR    . INCISION AND DRAINAGE ABSCESS     "groin"  . INSERTION OF DIALYSIS CATHETER Left 04/16/2017   Procedure: INSERTION of temporay DIALYSIS CATHETER, left femoral artery;  Surgeon: Serafina Mitchell, MD;  Location: [place];  Service: Vascular;  Laterality: Left;  . IR HYBRID TRAUMA EMBOLIZATION  04/16/2017  . IR PARACENTESIS  06/12/2017  . IR PARACENTESIS  06/24/2017  . IR PARACENTESIS  07/01/2017  . IR PARACENTESIS  07/10/2017  . IR PARACENTESIS  08/29/2017  . IR PARACENTESIS  09/18/2017  . IR PARACENTESIS  09/24/2017  . IR PARACENTESIS  10/10/2017  . ORIF ANKLE FRACTURE Right 02/16/2015   Procedure: OPEN REDUCTION INTERNAL FIXATION (ORIF) RIGHT ANKLE FRACTURE;  Surgeon: Altamese Verona, MD;  Location: [place];  Service: Orthopedics;  Laterality: Right;  . PATCH ANGIOPLASTY Right 04/16/2017   Procedure: PATCH ANGIOPLASTY OF RIGHT FEMORAL ARTERY USING BOVINE PERICARDIUM PATCH;  Surgeon: Serafina Mitchell, MD;  [facility];  Service: Vascular;  Laterality: Right;  . UMBILICAL HERNIA REPAIR  X 2  . WOUND EXPLORATION Right 05/09/2017   Procedure: EXPLORATION RIGHT GROIN;  Surgeon: Serafina Mitchell, MD;  [facility];  Service: Vascular;  Laterality: Right;   Past Medical History:  Diagnosis Date  . Arthritis   . CHF (congestive heart failure) ([place])   . Chronic anemia    2nd to renal disease  . CIN III (cervical intraepithelial neoplasia grade III) with severe dysplasia    S/P LEEP AND CONE  . Coronary artery disease    Status post cardiac catheterization June 2012 scattered coronary artery disease/atherosclerosis with 70-80% stenosis in a small right PDA.  . Diabetic Charcot's joint disease ([place])   . DM (  diabetes mellitus) ([place])    Long-term insulin  . End stage renal disease on dialysis [facility]) 05/02/11   "[facility]"; [facility]; M; W, F ("got it 04/15/2017 because of Thanksgiving")  . Fracture of 5th metatarsal 2016   Right  . Gastroparesis   . Heart murmur   . 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 History of abscesses in groins 12/06/2010  . Hyperlipidemia    Hypertriglyceridemia 449 HDL 25  . Hypertension   . Migraines    "just on dialysis days"  . Peripheral neuropathy    related to DM  . Peripheral vascular disease ([place])    Tibial occlusive disease evaluated by Dr. Kellie Simmering in August 2011. Medical therapy  . Renal insufficiency    Dialysis since 2012  . Seizures ([place])    "only when her sugar drops" (04/17/2017)  . Tobacco use disorder    Discontinued March 2012  . Trimalleolar fracture of ankle, closed 02/09/2015   Right   BP (!) 161/85 (BP Location: Right Arm, Patient Position: Sitting, Cuff Size: Normal)   Pulse 79   Resp 14   Ht 6\' 4"  (1.93 m) Comment: patient reported  Wt 170 lb (77.1 kg) Comment: patient reported  LMP 10/09/2015 Comment: pt on dialysis  SpO2 97%   BMI 20.69 kg/m   Opioid Risk Score:   Fall Risk Score:  `1  Depression screen PHQ 2/9  Depression screen [facility] 2/9 09/11/2017 07/31/2017 07/22/2017 07/15/2017 06/15/2017 03/04/2015  Decreased Interest 0 2 0 0 0 0  Down, Depressed, Hopeless 0 2 0 1 0 0  PHQ - 2 Score 0 4 0 1 0 0  Altered sleeping 0  2 - - - -  Tired, decreased energy 0 1 - - - -  Change in appetite 0 0 - - - -  Feeling bad or failure about yourself  0 2 - - - -  Trouble concentrating 0 1 - - - -  Moving slowly or fidgety/restless 0 0 - - - -  Suicidal thoughts 0 0 - - - -  PHQ-9 Score 0 10 - - - -  Difficult doing work/chores Not difficult at all Not difficult at all - - - -  Some recent data might be hidden        Review of Systems  HENT: Negative.   Eyes: Negative.   Respiratory: Negative.   Cardiovascular: Negative.   Gastrointestinal: Negative.   Endocrine: Negative.   Genitourinary: Negative.   Musculoskeletal: Positive for gait problem.  Skin: Negative.   Allergic/Immunologic: Negative.   Neurological: Positive for weakness.  Hematological: Bruises/bleeds easily.  Psychiatric/Behavioral: The patient is nervous/anxious.   All other systems reviewed and are negative.     Objective:   Physical Exam Constitutional: She has a sickly appearance. No distress.  HENT: Normocephalic and atraumatic.  Eyes:  and EOM are normal. No discharge.  Cardiovascular: RRR. No JVD. Respiratory: Effort normal and breath sounds normal.  GI: Bowel sounds are normal. +Distended. Ascites, firm.  Musculoskeletal: She exhibits mild edema.  Neurological: She is alert and oriented.  Motor: 4+/5 B/l UE LLE HF 4-/5, KE 4-/5, ADF 4/5 RLE HF 4/5 (unchanged) Skin: Skin is warm and dry. She is not diaphoretic.  Ischial Ulcer, not examined  Right BKA with dressing c/d/i Psychiatric: Her speech is normal. Blunt.    Assessment & Plan:  42 y.o. female with histoyr of CAD, ESRD, T2DM, right foot wounds s/p revascularization complicated hospital course 03/2017, sacral decub and RLE ischemia requiring  right metatarsal amputation with exploration of right groin wound 04/2017 presents for follow up for right BKA as well as multiple other medical issues.   1. Deficits with mobility, transfers, self-care secondary to Right BKA and  wounds.   Encouraged follow up with therapies, needs to follow up, reminded again  Cont follow up with Vascular  2. Left Ischium decub with osteo  Cont dressing changes  Cont follow up with wound care  Surgery postponed due to other medical issues  3. HTN:   Remains uncontrolled   Cont follow up with Nephrology  4. Ascites  Monitor weights  Decision to tap per Nephro, cont periodic paracentesis  5. ?Brain mass  Needs follow up with Neurosurg, encouraged follow up  6. Gait abnormality  Resume therapies  Cont power chair at home and wheelchair in community

## 2017-10-24 DIAGNOSIS — N2581 Secondary hyperparathyroidism of renal origin: Secondary | ICD-10-CM | POA: Diagnosis not present

## 2017-10-24 DIAGNOSIS — N186 End stage renal disease: Secondary | ICD-10-CM | POA: Diagnosis not present

## 2017-10-24 DIAGNOSIS — I2510 Atherosclerotic heart disease of native coronary artery without angina pectoris: Secondary | ICD-10-CM | POA: Diagnosis not present

## 2017-10-24 DIAGNOSIS — T8789 Other complications of amputation stump: Secondary | ICD-10-CM | POA: Diagnosis not present

## 2017-10-24 DIAGNOSIS — I132 Hypertensive heart and chronic kidney disease with heart failure and with stage 5 chronic kidney disease, or end stage renal disease: Secondary | ICD-10-CM | POA: Diagnosis not present

## 2017-10-24 DIAGNOSIS — E1122 Type 2 diabetes mellitus with diabetic chronic kidney disease: Secondary | ICD-10-CM | POA: Diagnosis not present

## 2017-10-24 DIAGNOSIS — L89154 Pressure ulcer of sacral region, stage 4: Secondary | ICD-10-CM | POA: Diagnosis not present

## 2017-10-24 DIAGNOSIS — D649 Anemia, unspecified: Secondary | ICD-10-CM | POA: Diagnosis not present

## 2017-10-24 DIAGNOSIS — I5032 Chronic diastolic (congestive) heart failure: Secondary | ICD-10-CM | POA: Diagnosis not present

## 2017-10-24 DIAGNOSIS — E8770 Fluid overload, unspecified: Secondary | ICD-10-CM | POA: Diagnosis not present

## 2017-10-25 DIAGNOSIS — E1129 Type 2 diabetes mellitus with other diabetic kidney complication: Secondary | ICD-10-CM | POA: Diagnosis not present

## 2017-10-25 DIAGNOSIS — N186 End stage renal disease: Secondary | ICD-10-CM | POA: Diagnosis not present

## 2017-10-25 DIAGNOSIS — Z992 Dependence on renal dialysis: Secondary | ICD-10-CM | POA: Diagnosis not present

## 2017-10-27 DIAGNOSIS — N186 End stage renal disease: Secondary | ICD-10-CM | POA: Diagnosis not present

## 2017-10-27 DIAGNOSIS — D649 Anemia, unspecified: Secondary | ICD-10-CM | POA: Diagnosis not present

## 2017-10-27 DIAGNOSIS — N2581 Secondary hyperparathyroidism of renal origin: Secondary | ICD-10-CM | POA: Diagnosis not present

## 2017-10-28 DIAGNOSIS — L89154 Pressure ulcer of sacral region, stage 4: Secondary | ICD-10-CM | POA: Diagnosis not present

## 2017-10-28 DIAGNOSIS — I5032 Chronic diastolic (congestive) heart failure: Secondary | ICD-10-CM | POA: Diagnosis not present

## 2017-10-28 DIAGNOSIS — I132 Hypertensive heart and chronic kidney disease with heart failure and with stage 5 chronic kidney disease, or end stage renal disease: Secondary | ICD-10-CM | POA: Diagnosis not present

## 2017-10-28 DIAGNOSIS — T8789 Other complications of amputation stump: Secondary | ICD-10-CM | POA: Diagnosis not present

## 2017-10-28 DIAGNOSIS — I2510 Atherosclerotic heart disease of native coronary artery without angina pectoris: Secondary | ICD-10-CM | POA: Diagnosis not present

## 2017-10-28 DIAGNOSIS — E1122 Type 2 diabetes mellitus with diabetic chronic kidney disease: Secondary | ICD-10-CM | POA: Diagnosis not present

## 2017-10-29 DIAGNOSIS — N2581 Secondary hyperparathyroidism of renal origin: Secondary | ICD-10-CM | POA: Diagnosis not present

## 2017-10-29 DIAGNOSIS — D649 Anemia, unspecified: Secondary | ICD-10-CM | POA: Diagnosis not present

## 2017-10-29 DIAGNOSIS — N186 End stage renal disease: Secondary | ICD-10-CM | POA: Diagnosis not present

## 2017-10-31 DIAGNOSIS — D649 Anemia, unspecified: Secondary | ICD-10-CM | POA: Diagnosis not present

## 2017-10-31 DIAGNOSIS — N2581 Secondary hyperparathyroidism of renal origin: Secondary | ICD-10-CM | POA: Diagnosis not present

## 2017-10-31 DIAGNOSIS — N186 End stage renal disease: Secondary | ICD-10-CM | POA: Diagnosis not present

## 2017-11-03 ENCOUNTER — Other Ambulatory Visit (HOSPITAL_COMMUNITY): Payer: Self-pay | Admitting: Nephrology

## 2017-11-03 DIAGNOSIS — N2581 Secondary hyperparathyroidism of renal origin: Secondary | ICD-10-CM | POA: Diagnosis not present

## 2017-11-03 DIAGNOSIS — D649 Anemia, unspecified: Secondary | ICD-10-CM | POA: Diagnosis not present

## 2017-11-03 DIAGNOSIS — R188 Other ascites: Secondary | ICD-10-CM

## 2017-11-03 DIAGNOSIS — N186 End stage renal disease: Secondary | ICD-10-CM | POA: Diagnosis not present

## 2017-11-05 ENCOUNTER — Other Ambulatory Visit (HOSPITAL_COMMUNITY): Payer: Self-pay | Admitting: Nephrology

## 2017-11-05 ENCOUNTER — Encounter (HOSPITAL_BASED_OUTPATIENT_CLINIC_OR_DEPARTMENT_OTHER): Payer: Medicare Other | Attending: Physician Assistant

## 2017-11-05 DIAGNOSIS — E1040 Type 1 diabetes mellitus with diabetic neuropathy, unspecified: Secondary | ICD-10-CM | POA: Diagnosis not present

## 2017-11-05 DIAGNOSIS — I2510 Atherosclerotic heart disease of native coronary artery without angina pectoris: Secondary | ICD-10-CM | POA: Insufficient documentation

## 2017-11-05 DIAGNOSIS — Z89511 Acquired absence of right leg below knee: Secondary | ICD-10-CM | POA: Insufficient documentation

## 2017-11-05 DIAGNOSIS — Z794 Long term (current) use of insulin: Secondary | ICD-10-CM | POA: Insufficient documentation

## 2017-11-05 DIAGNOSIS — L89154 Pressure ulcer of sacral region, stage 4: Secondary | ICD-10-CM | POA: Insufficient documentation

## 2017-11-05 DIAGNOSIS — Z87891 Personal history of nicotine dependence: Secondary | ICD-10-CM | POA: Insufficient documentation

## 2017-11-05 DIAGNOSIS — Z992 Dependence on renal dialysis: Secondary | ICD-10-CM | POA: Diagnosis not present

## 2017-11-05 DIAGNOSIS — N186 End stage renal disease: Secondary | ICD-10-CM | POA: Insufficient documentation

## 2017-11-05 DIAGNOSIS — R188 Other ascites: Secondary | ICD-10-CM

## 2017-11-05 DIAGNOSIS — N2581 Secondary hyperparathyroidism of renal origin: Secondary | ICD-10-CM | POA: Diagnosis not present

## 2017-11-05 DIAGNOSIS — E1022 Type 1 diabetes mellitus with diabetic chronic kidney disease: Secondary | ICD-10-CM | POA: Insufficient documentation

## 2017-11-05 DIAGNOSIS — L928 Other granulomatous disorders of the skin and subcutaneous tissue: Secondary | ICD-10-CM | POA: Diagnosis not present

## 2017-11-05 DIAGNOSIS — L980 Pyogenic granuloma: Secondary | ICD-10-CM | POA: Insufficient documentation

## 2017-11-05 DIAGNOSIS — I509 Heart failure, unspecified: Secondary | ICD-10-CM | POA: Insufficient documentation

## 2017-11-05 DIAGNOSIS — L97821 Non-pressure chronic ulcer of other part of left lower leg limited to breakdown of skin: Secondary | ICD-10-CM | POA: Insufficient documentation

## 2017-11-05 DIAGNOSIS — D649 Anemia, unspecified: Secondary | ICD-10-CM | POA: Diagnosis not present

## 2017-11-06 ENCOUNTER — Ambulatory Visit (HOSPITAL_COMMUNITY)
Admission: RE | Admit: 2017-11-06 | Discharge: 2017-11-06 | Disposition: A | Discharge status: Home / Self Care | Payer: Medicare Other | Source: Ambulatory Visit | Attending: Nephrology | Admitting: Nephrology

## 2017-11-06 ENCOUNTER — Encounter (HOSPITAL_COMMUNITY): Payer: Self-pay | Admitting: Student

## 2017-11-06 ENCOUNTER — Ambulatory Visit (HOSPITAL_COMMUNITY): Payer: Medicare Other

## 2017-11-06 DIAGNOSIS — R188 Other ascites: Secondary | ICD-10-CM | POA: Insufficient documentation

## 2017-11-06 DIAGNOSIS — K7460 Unspecified cirrhosis of liver: Secondary | ICD-10-CM | POA: Diagnosis not present

## 2017-11-06 DIAGNOSIS — I871 Compression of vein: Secondary | ICD-10-CM | POA: Diagnosis not present

## 2017-11-06 DIAGNOSIS — N186 End stage renal disease: Secondary | ICD-10-CM | POA: Diagnosis not present

## 2017-11-06 DIAGNOSIS — Z992 Dependence on renal dialysis: Secondary | ICD-10-CM | POA: Diagnosis not present

## 2017-11-06 HISTORY — PX: IR PARACENTESIS: IMG2679

## 2017-11-06 MED ORDER — LIDOCAINE HCL (PF) 2 % IJ SOLN
INTRAMUSCULAR | Status: AC
  Filled 2017-11-06: qty 20

## 2017-11-06 NOTE — Procedures (Signed)
PROCEDURE SUMMARY:  Successful image-guided paracentesis from the right lower abdomen.  Yielded 8 liters of clear gold fluid.  No immediate complications.  Patient tolerated well.   Specimen was not sent for labs.  Claris Pong Louk PA-C 11/06/2017 1:55 PM

## 2017-11-07 DIAGNOSIS — I132 Hypertensive heart and chronic kidney disease with heart failure and with stage 5 chronic kidney disease, or end stage renal disease: Secondary | ICD-10-CM | POA: Diagnosis not present

## 2017-11-07 DIAGNOSIS — D649 Anemia, unspecified: Secondary | ICD-10-CM | POA: Diagnosis not present

## 2017-11-07 DIAGNOSIS — I2510 Atherosclerotic heart disease of native coronary artery without angina pectoris: Secondary | ICD-10-CM | POA: Diagnosis not present

## 2017-11-07 DIAGNOSIS — E1122 Type 2 diabetes mellitus with diabetic chronic kidney disease: Secondary | ICD-10-CM | POA: Diagnosis not present

## 2017-11-07 DIAGNOSIS — N186 End stage renal disease: Secondary | ICD-10-CM | POA: Diagnosis not present

## 2017-11-07 DIAGNOSIS — T8789 Other complications of amputation stump: Secondary | ICD-10-CM | POA: Diagnosis not present

## 2017-11-07 DIAGNOSIS — N2581 Secondary hyperparathyroidism of renal origin: Secondary | ICD-10-CM | POA: Diagnosis not present

## 2017-11-07 DIAGNOSIS — L89154 Pressure ulcer of sacral region, stage 4: Secondary | ICD-10-CM | POA: Diagnosis not present

## 2017-11-07 DIAGNOSIS — I5032 Chronic diastolic (congestive) heart failure: Secondary | ICD-10-CM | POA: Diagnosis not present

## 2017-11-08 DIAGNOSIS — I5032 Chronic diastolic (congestive) heart failure: Secondary | ICD-10-CM | POA: Diagnosis not present

## 2017-11-08 DIAGNOSIS — I132 Hypertensive heart and chronic kidney disease with heart failure and with stage 5 chronic kidney disease, or end stage renal disease: Secondary | ICD-10-CM | POA: Diagnosis not present

## 2017-11-08 DIAGNOSIS — L89154 Pressure ulcer of sacral region, stage 4: Secondary | ICD-10-CM | POA: Diagnosis not present

## 2017-11-08 DIAGNOSIS — N186 End stage renal disease: Secondary | ICD-10-CM | POA: Diagnosis not present

## 2017-11-08 DIAGNOSIS — T8789 Other complications of amputation stump: Secondary | ICD-10-CM | POA: Diagnosis not present

## 2017-11-08 DIAGNOSIS — E1122 Type 2 diabetes mellitus with diabetic chronic kidney disease: Secondary | ICD-10-CM | POA: Diagnosis not present

## 2017-11-10 ENCOUNTER — Ambulatory Visit: Payer: Medicare Other | Admitting: Surgery

## 2017-11-10 DIAGNOSIS — N2581 Secondary hyperparathyroidism of renal origin: Secondary | ICD-10-CM | POA: Diagnosis not present

## 2017-11-10 DIAGNOSIS — N186 End stage renal disease: Secondary | ICD-10-CM | POA: Diagnosis not present

## 2017-11-10 DIAGNOSIS — D649 Anemia, unspecified: Secondary | ICD-10-CM | POA: Diagnosis not present

## 2017-11-12 DIAGNOSIS — N186 End stage renal disease: Secondary | ICD-10-CM | POA: Diagnosis not present

## 2017-11-12 DIAGNOSIS — D649 Anemia, unspecified: Secondary | ICD-10-CM | POA: Diagnosis not present

## 2017-11-12 DIAGNOSIS — N2581 Secondary hyperparathyroidism of renal origin: Secondary | ICD-10-CM | POA: Diagnosis not present

## 2017-11-13 DIAGNOSIS — I132 Hypertensive heart and chronic kidney disease with heart failure and with stage 5 chronic kidney disease, or end stage renal disease: Secondary | ICD-10-CM | POA: Diagnosis not present

## 2017-11-13 DIAGNOSIS — T8789 Other complications of amputation stump: Secondary | ICD-10-CM | POA: Diagnosis not present

## 2017-11-13 DIAGNOSIS — I5032 Chronic diastolic (congestive) heart failure: Secondary | ICD-10-CM | POA: Diagnosis not present

## 2017-11-13 DIAGNOSIS — E1122 Type 2 diabetes mellitus with diabetic chronic kidney disease: Secondary | ICD-10-CM | POA: Diagnosis not present

## 2017-11-13 DIAGNOSIS — N186 End stage renal disease: Secondary | ICD-10-CM | POA: Diagnosis not present

## 2017-11-13 DIAGNOSIS — L89154 Pressure ulcer of sacral region, stage 4: Secondary | ICD-10-CM | POA: Diagnosis not present

## 2017-11-14 DIAGNOSIS — D649 Anemia, unspecified: Secondary | ICD-10-CM | POA: Diagnosis not present

## 2017-11-14 DIAGNOSIS — N2581 Secondary hyperparathyroidism of renal origin: Secondary | ICD-10-CM | POA: Diagnosis not present

## 2017-11-14 DIAGNOSIS — N186 End stage renal disease: Secondary | ICD-10-CM | POA: Diagnosis not present

## 2017-11-17 ENCOUNTER — Other Ambulatory Visit (HOSPITAL_COMMUNITY): Payer: Self-pay | Admitting: Nephrology

## 2017-11-17 ENCOUNTER — Encounter: Payer: Self-pay | Admitting: Surgery

## 2017-11-17 ENCOUNTER — Ambulatory Visit (INDEPENDENT_AMBULATORY_CARE_PROVIDER_SITE_OTHER): Payer: Medicare Other | Admitting: Surgery

## 2017-11-17 VITALS — BP 141/77 | HR 77 | Temp 97.8°F | Ht 76.0 in

## 2017-11-17 DIAGNOSIS — Z89511 Acquired absence of right leg below knee: Secondary | ICD-10-CM

## 2017-11-17 DIAGNOSIS — I2510 Atherosclerotic heart disease of native coronary artery without angina pectoris: Secondary | ICD-10-CM

## 2017-11-17 DIAGNOSIS — N2581 Secondary hyperparathyroidism of renal origin: Secondary | ICD-10-CM | POA: Diagnosis not present

## 2017-11-17 DIAGNOSIS — D649 Anemia, unspecified: Secondary | ICD-10-CM | POA: Diagnosis not present

## 2017-11-17 DIAGNOSIS — R188 Other ascites: Secondary | ICD-10-CM

## 2017-11-17 DIAGNOSIS — N186 End stage renal disease: Secondary | ICD-10-CM | POA: Diagnosis not present

## 2017-11-17 MED ORDER — COLLAGENASE 250 UNIT/GM EX OINT: 1.0000 "application " | TOPICAL_OINTMENT | Freq: Every day | CUTANEOUS | 1 refills | Status: AC

## 2017-11-17 NOTE — Progress Notes (Signed)
HISTORY AND PHYSICAL     CC:  Follow up for wound Requesting Provider:  Merrilee Seashore, MD  HPI: This is a 42 y.o. female who presents for evaluation of right BKA stump.  She has a wound care nurse come to her home once a week for checks.  They recommend silver alginate and collagen wound care daily.   She is s/p  right CFA, profunda, SFA and external iliac endarterectomy with bovine patch angioplasty on 04/16/17 and during that time, she became unstable and received CPR &was transferred to the ICU.  Once she became more stable, she underwent a right TMA and exploration of her groin with evacuation of seroma and wound vac placement on 05/09/17.   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  They are using silver alginate on the wound.  She states she has been doing well.  Her fistula is working well for dialysis.   Past Medical History:  Diagnosis Date  . Arthritis   . CHF (congestive heart failure) ([place])   . Chronic anemia    2nd to renal disease  . CIN III (cervical intraepithelial neoplasia grade III) with severe dysplasia    S/P LEEP AND CONE  . Coronary artery disease    Status post cardiac catheterization June 2012 scattered coronary artery disease/atherosclerosis with 70-80% stenosis in a small right PDA.  . Diabetic Charcot's joint disease ([place])   . DM (diabetes mellitus) ([place])    Long-term insulin  . End stage renal disease on dialysis [facility]) 05/02/11   "[facility]"; [facility]; M; W, F ("got it 04/15/2017 because of Thanksgiving")  . Fracture of 5th metatarsal 2016   Right  . Gastroparesis   . Heart murmur   . 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 History of abscesses in groins 12/06/2010  . Hyperlipidemia    Hypertriglyceridemia 449 HDL 25  . Hypertension   . Migraines    "just on dialysis days"  . Peripheral neuropathy    related to DM  . Peripheral vascular disease ([place])      Tibial occlusive disease evaluated by Dr. Kellie Simmering in August 2011. Medical therapy  . Renal insufficiency    Dialysis since 2012  . Seizures ([place])    "only when her sugar drops" (04/17/2017)  . Tobacco use disorder    Discontinued March 2012  . Trimalleolar fracture of ankle, closed 02/09/2015   Right    Past Surgical History:  Procedure Laterality Date  . ABDOMINAL AORTOGRAM W/LOWER EXTREMITY [name] 03/27/2017   Procedure: ABDOMINAL AORTOGRAM W/LOWER EXTREMITY;  Surgeon: Conrad Montrose, MD;  Location: [place] [facility];  Service: Cardiovascular;  Laterality: [name];  . AMPUTATION Right 05/09/2017   Procedure: AMPUTATION TRANSMETATARSAL OF RIGHT FOOT;  Surgeon: Serafina Mitchell, MD;  Location: [place];  Service: Vascular;  Laterality: Right;  . AMPUTATION Right 06/17/2017   Procedure: AMPUTATION BELOW KNEE RIGHT;  Surgeon: Serafina Mitchell, MD;  [facility];  Service: Vascular;  Laterality: Right;  . AV FISTULA PLACEMENT Left 04/2010   "lower arm"  . CARDIAC CATHETERIZATION [name] 02/10/2015   Procedure: Left Heart Cath and Coronary Angiography;  Surgeon: Dixie Dials, MD;  Location: [place] [facility];  Service: Cardiovascular;  Laterality: [name];  . CARDIAC CATHETERIZATION [name] 02/21/2016   Procedure: Right Heart Cath;  Surgeon: Jolaine Artist, MD;  Location: [place] [facility];  Service:  Cardiovascular;  Laterality: [name];  . CATARACT EXTRACTION W/ INTRAOCULAR LENS  IMPLANT, BILATERAL Bilateral   . CERVICAL BIOPSY  W/ LOOP ELECTRODE EXCISION     h/o  . CERVICAL CONE BIOPSY     h/o  . COLPOSCOPY    . DILATION AND CURETTAGE OF UTERUS  2009  . ENDARTERECTOMY FEMORAL Right 04/16/2017   Procedure: RIGHT FEMORAL ENDARTERECTOMY;  Surgeon: Serafina Mitchell, MD;  Location: [place];  Service: Vascular;  Laterality: Right;  . FRACTURE SURGERY    . HERNIA REPAIR    . INCISION AND DRAINAGE ABSCESS     "groin"  . INSERTION OF DIALYSIS CATHETER Left 04/16/2017   Procedure: INSERTION of temporay DIALYSIS  CATHETER, left femoral artery;  Surgeon: Serafina Mitchell, MD;  Location: [place];  Service: Vascular;  Laterality: Left;  . IR HYBRID TRAUMA EMBOLIZATION  04/16/2017  . IR PARACENTESIS  06/12/2017  . IR PARACENTESIS  06/24/2017  . IR PARACENTESIS  07/01/2017  . IR PARACENTESIS  07/10/2017  . IR PARACENTESIS  08/29/2017  . IR PARACENTESIS  09/18/2017  . IR PARACENTESIS  09/24/2017  . IR PARACENTESIS  10/10/2017  . IR PARACENTESIS  11/06/2017  . ORIF ANKLE FRACTURE Right 02/16/2015   Procedure: OPEN REDUCTION INTERNAL FIXATION (ORIF) RIGHT ANKLE FRACTURE;  Surgeon: Altamese Cumberland, MD;  Location: [place];  Service: Orthopedics;  Laterality: Right;  . PATCH ANGIOPLASTY Right 04/16/2017   Procedure: PATCH ANGIOPLASTY OF RIGHT FEMORAL ARTERY USING BOVINE PERICARDIUM PATCH;  Surgeon: Serafina Mitchell, MD;  [facility];  Service: Vascular;  Laterality: Right;  . UMBILICAL HERNIA REPAIR  X 2  . WOUND EXPLORATION Right 05/09/2017   Procedure: EXPLORATION RIGHT GROIN;  Surgeon: Serafina Mitchell, MD;  Location: [place];  Service: Vascular;  Laterality: Right;    Allergies  Allergen Reactions  . Cephalexin Other (See Comments)    Reaction unknown- Childhood allergy Tolerated Ceftriaxone, ceftazidime, zosyn, augmentin in the past  . Sulfamethoxazole-Trimethoprim Other (See Comments)    Unknown reaction. Pt states that she was told by her mother that she had allergy to Bactrim as a child.    Current Outpatient Medications  Medication Sig Dispense Refill  . acetaminophen (TYLENOL) 325 MG tablet Take 1-2 tablets (325-650 mg total) by mouth every 4 (four) hours as needed for mild pain.    Marland Kitchen amLODipine (NORVASC) 10 MG tablet Take 1 tablet (10 mg total) by mouth daily after supper. 30 tablet 0  . atorvastatin (LIPITOR) 40 MG tablet Take 1 tablet (40 mg total) by mouth daily at 6 PM. 30 tablet 0  . b complex-vitamin c-folic acid (NEPHRO-VITE) 0.8 MG TABS tablet Take 1 tablet by mouth daily.    . cinacalcet (SENSIPAR)  30 MG tablet Take 2 tablets (60 mg total) by mouth daily with supper. 60 tablet 0  . cloNIDine (CATAPRES) 0.3 MG tablet Take 1 tablet (0.3 mg total) by mouth 3 (three) times daily. 90 tablet 0  . doxycycline (VIBRA-TABS) 100 MG tablet Take 1 tablet (100 mg total) by mouth every 12 (twelve) hours. For 7days 14 tablet 0  . ferric citrate (AURYXIA) 1 GM 210 MG(Fe) tablet Take 2 tablets (420 mg total) by mouth 3 (three) times daily with meals. 270 tablet 0  . gabapentin (NEURONTIN) 100 MG capsule Take 1 capsule (100 mg total) by mouth 2 (two) times daily. 60 capsule 0  . hydrALAZINE (APRESOLINE) 100 MG tablet Take 1 tablet (100 mg total) by mouth every 8 (eight) hours. [place]  tablet 1  . Insulin Glargine (LANTUS SOLOSTAR) 100 UNIT/ML Solostar Pen Inject 20 Units into the skin daily at 10 pm. 15 mL 2  . isosorbide mononitrate (IMDUR) 120 MG 24 hr tablet Take 120 mg by mouth daily. (does no take on MWF)  2  . labetalol (NORMODYNE) 300 MG tablet Take 1 tablet (300 mg total) by mouth 3 (three) times daily. 90 tablet 0  . LORazepam (ATIVAN) 0.5 MG tablet Take 1 tablet (0.5 mg total) by mouth every 12 (twelve) hours as needed for anxiety. 30 tablet 0  . metoCLOPramide (REGLAN) 5 MG tablet Take 0.5 tablets (2.5 mg total) by mouth 3 (three) times daily before meals. 45 tablet 0  . pantoprazole (PROTONIX) 40 MG tablet Take 1 tablet (40 mg total) by mouth daily. 30 tablet 0  . sertraline (ZOLOFT) 50 MG tablet Take 1 tablet (50 mg total) by mouth daily. 30 tablet 0  . sevelamer carbonate (RENVELA) 800 MG tablet Take 3 tablets (2,400 mg total) by mouth 3 (three) times daily with meals. Give 3 tablets to = 2400 mg TID 270 tablet 0  . amoxicillin-clavulanate (AUGMENTIN) 500-125 MG tablet Take 1 tablet (500 mg total) by mouth daily. For 7days (Patient not taking: Reported on 11/17/2017) 7 tablet 0   No current facility-administered medications for this visit.     Family History  Problem Relation Age of Onset  .  Diabetes Mother   . Hypertension Mother   . Diabetes Father   . Hyperlipidemia Father   . Hypertension Father   . Diabetes Sister   . Stroke Maternal Grandmother   . Diabetes Sister   . Breast cancer Maternal Aunt        Age 30's    Social History   Socioeconomic History  . Marital status: Married    Spouse name: Not on file  . Number of children: Not on file  . Years of education: Not on file  . Highest education level: Not on file  Occupational History    Employer: [employer]  . Financial resource strain: Not on file  . Food insecurity:    Worry: Not on file    Inability: Not on file  . Transportation needs:    Medical: Not on file    Non-medical: Not on file  Tobacco Use  . Smoking status: Former Smoker    Packs/day: 0.30    Years: 10.00    Pack years: 3.00    Types: Cigarettes    Last attempt to quit: 08/05/2011    Years since quitting: 6.2  . Smokeless tobacco: Never Used  Substance and Sexual Activity  . Alcohol use: No  . Drug use: No  . Sexual activity: Not on file  Lifestyle  . Physical activity:    Days per week: Not on file    Minutes per session: Not on file  . Stress: Not on file  Relationships  . Social connections:    Talks on phone: Not on file    Gets together: Not on file    Attends religious service: Not on file    Active member of club or organization: Not on file    Attends meetings of clubs or organizations: Not on file    Relationship status: Not on file  . Intimate partner violence:    Fear of current or ex partner: Not on file    Emotionally abused: Not on file    Physically abused: Not on file  Forced sexual activity: Not on file  Other Topics Concern  . Not on file  Social History Narrative  . Not on file     REVIEW OF SYSTEMS:   [X]  denotes positive finding, [ ]  denotes negative finding Cardiac  Comments:  Chest pain or chest pressure:    Shortness of breath upon exertion:    Short of breath when lying  flat:    Irregular heart rhythm:        Vascular    Pain in calf, thigh, or hip brought on by ambulation:    Pain in feet at night that wakes you up from your sleep:     Blood clot in your veins:    Leg swelling:         Pulmonary    Oxygen at home:    Productive cough:     Wheezing:         Neurologic    Sudden weakness in arms or legs:     Sudden numbness in arms or legs:     Sudden onset of difficulty speaking or slurred speech:    Temporary loss of vision in one eye:     Problems with dizziness:         Gastrointestinal    Blood in stool:     Vomited blood:         Genitourinary    Burning when urinating:     Blood in urine:        Psychiatric    Major depression:         Hematologic    Bleeding problems:    Problems with blood clotting too easily:        Skin    Rashes or ulcers: x Continued wound on stump      Constitutional    Fever or chills:      PHYSICAL EXAMINATION:  Vitals:   11/17/17 1454  BP: (!) 141/77  Pulse: 77  Temp: 97.8 F (36.6 C)  SpO2: 96%   Vitals:   11/17/17 1454  Height: 6\' 4"  (1.93 m)   Body mass index is 20.69 kg/m.  General:  WDWN in NAD; vital signs documented above Gait: Not observed-in wheelchair HENT: WNL, normocephalic Pulmonary: normal non-labored breathing Skin: without rashes Vascular Exam/Pulses: Wound has decreased in size; there is some fibrinous exudate present in the wound.  Extremities:    Musculoskeletal: no muscle wasting or atrophy  Neurologic: A&O X 3;  No focal weakness or paresthesias are detected Psychiatric:  The pt has Normal affect.   Non-Invasive Vascular Imaging:   none  Pt meds includes: Statin:  Yes.   Beta Blocker:  Yes Aspirin:  No. ACEI:  No. ARB:  No. CCB use:  Yes Other Antiplatelet/Anticoagulant:  No   ASSESSMENT/PLAN:: 42 y.o. 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   -wound continues to get smaller.  There is fibrinous exudate within the  wound.  Dr. Trula Slade has suggested using Santyl to help debride the wound.  She will follow up with Dr. Trula Slade again in 6 weeks.  She will contact us sooner if she needs to. -A prescription for santyl was sent electronically to her pharmacy   [name], PA-C [facility] [phone]  Clinic MD:  Pt seen and examined with Dr. Trula Slade

## 2017-11-19 DIAGNOSIS — D649 Anemia, unspecified: Secondary | ICD-10-CM | POA: Diagnosis not present

## 2017-11-19 DIAGNOSIS — N186 End stage renal disease: Secondary | ICD-10-CM | POA: Diagnosis not present

## 2017-11-19 DIAGNOSIS — N2581 Secondary hyperparathyroidism of renal origin: Secondary | ICD-10-CM | POA: Diagnosis not present

## 2017-11-20 DIAGNOSIS — E1022 Type 1 diabetes mellitus with diabetic chronic kidney disease: Secondary | ICD-10-CM | POA: Diagnosis not present

## 2017-11-20 DIAGNOSIS — E11622 Type 2 diabetes mellitus with other skin ulcer: Secondary | ICD-10-CM | POA: Diagnosis not present

## 2017-11-20 DIAGNOSIS — L97821 Non-pressure chronic ulcer of other part of left lower leg limited to breakdown of skin: Secondary | ICD-10-CM | POA: Diagnosis not present

## 2017-11-20 DIAGNOSIS — N186 End stage renal disease: Secondary | ICD-10-CM | POA: Diagnosis not present

## 2017-11-20 DIAGNOSIS — E1040 Type 1 diabetes mellitus with diabetic neuropathy, unspecified: Secondary | ICD-10-CM | POA: Diagnosis not present

## 2017-11-20 DIAGNOSIS — L97822 Non-pressure chronic ulcer of other part of left lower leg with fat layer exposed: Secondary | ICD-10-CM | POA: Diagnosis not present

## 2017-11-20 DIAGNOSIS — L980 Pyogenic granuloma: Secondary | ICD-10-CM | POA: Diagnosis not present

## 2017-11-20 DIAGNOSIS — L89154 Pressure ulcer of sacral region, stage 4: Secondary | ICD-10-CM | POA: Diagnosis not present

## 2017-11-21 DIAGNOSIS — T8789 Other complications of amputation stump: Secondary | ICD-10-CM | POA: Diagnosis not present

## 2017-11-21 DIAGNOSIS — I5032 Chronic diastolic (congestive) heart failure: Secondary | ICD-10-CM | POA: Diagnosis not present

## 2017-11-21 DIAGNOSIS — N186 End stage renal disease: Secondary | ICD-10-CM | POA: Diagnosis not present

## 2017-11-21 DIAGNOSIS — I132 Hypertensive heart and chronic kidney disease with heart failure and with stage 5 chronic kidney disease, or end stage renal disease: Secondary | ICD-10-CM | POA: Diagnosis not present

## 2017-11-21 DIAGNOSIS — N2581 Secondary hyperparathyroidism of renal origin: Secondary | ICD-10-CM | POA: Diagnosis not present

## 2017-11-21 DIAGNOSIS — E1122 Type 2 diabetes mellitus with diabetic chronic kidney disease: Secondary | ICD-10-CM | POA: Diagnosis not present

## 2017-11-21 DIAGNOSIS — D649 Anemia, unspecified: Secondary | ICD-10-CM | POA: Diagnosis not present

## 2017-11-21 DIAGNOSIS — L89154 Pressure ulcer of sacral region, stage 4: Secondary | ICD-10-CM | POA: Diagnosis not present

## 2017-11-24 DIAGNOSIS — N186 End stage renal disease: Secondary | ICD-10-CM | POA: Diagnosis not present

## 2017-11-24 DIAGNOSIS — I132 Hypertensive heart and chronic kidney disease with heart failure and with stage 5 chronic kidney disease, or end stage renal disease: Secondary | ICD-10-CM | POA: Diagnosis not present

## 2017-11-24 DIAGNOSIS — Z992 Dependence on renal dialysis: Secondary | ICD-10-CM | POA: Diagnosis not present

## 2017-11-24 DIAGNOSIS — L980 Pyogenic granuloma: Secondary | ICD-10-CM | POA: Diagnosis not present

## 2017-11-24 DIAGNOSIS — L89154 Pressure ulcer of sacral region, stage 4: Secondary | ICD-10-CM | POA: Diagnosis not present

## 2017-11-24 DIAGNOSIS — E1129 Type 2 diabetes mellitus with other diabetic kidney complication: Secondary | ICD-10-CM | POA: Diagnosis not present

## 2017-11-24 DIAGNOSIS — T8789 Other complications of amputation stump: Secondary | ICD-10-CM | POA: Diagnosis not present

## 2017-11-24 DIAGNOSIS — E1022 Type 1 diabetes mellitus with diabetic chronic kidney disease: Secondary | ICD-10-CM | POA: Diagnosis not present

## 2017-11-24 DIAGNOSIS — I5032 Chronic diastolic (congestive) heart failure: Secondary | ICD-10-CM | POA: Diagnosis not present

## 2017-11-24 DIAGNOSIS — N2581 Secondary hyperparathyroidism of renal origin: Secondary | ICD-10-CM | POA: Diagnosis not present

## 2017-11-24 DIAGNOSIS — L89153 Pressure ulcer of sacral region, stage 3: Secondary | ICD-10-CM | POA: Diagnosis not present

## 2017-11-24 DIAGNOSIS — E10622 Type 1 diabetes mellitus with other skin ulcer: Secondary | ICD-10-CM | POA: Diagnosis not present

## 2017-11-24 DIAGNOSIS — L97822 Non-pressure chronic ulcer of other part of left lower leg with fat layer exposed: Secondary | ICD-10-CM | POA: Diagnosis not present

## 2017-11-24 DIAGNOSIS — D649 Anemia, unspecified: Secondary | ICD-10-CM | POA: Diagnosis not present

## 2017-11-24 DIAGNOSIS — L97821 Non-pressure chronic ulcer of other part of left lower leg limited to breakdown of skin: Secondary | ICD-10-CM | POA: Diagnosis not present

## 2017-11-24 DIAGNOSIS — E1122 Type 2 diabetes mellitus with diabetic chronic kidney disease: Secondary | ICD-10-CM | POA: Diagnosis not present

## 2017-11-25 ENCOUNTER — Encounter (HOSPITAL_BASED_OUTPATIENT_CLINIC_OR_DEPARTMENT_OTHER): Payer: Medicare Other | Attending: Internal Medicine

## 2017-11-25 ENCOUNTER — Encounter (HOSPITAL_BASED_OUTPATIENT_CLINIC_OR_DEPARTMENT_OTHER): Payer: Self-pay

## 2017-11-25 DIAGNOSIS — L97821 Non-pressure chronic ulcer of other part of left lower leg limited to breakdown of skin: Secondary | ICD-10-CM | POA: Diagnosis not present

## 2017-11-25 DIAGNOSIS — L89153 Pressure ulcer of sacral region, stage 3: Secondary | ICD-10-CM | POA: Diagnosis not present

## 2017-11-25 DIAGNOSIS — E10622 Type 1 diabetes mellitus with other skin ulcer: Secondary | ICD-10-CM | POA: Diagnosis not present

## 2017-11-25 DIAGNOSIS — N186 End stage renal disease: Secondary | ICD-10-CM | POA: Diagnosis not present

## 2017-11-25 DIAGNOSIS — I132 Hypertensive heart and chronic kidney disease with heart failure and with stage 5 chronic kidney disease, or end stage renal disease: Secondary | ICD-10-CM | POA: Diagnosis not present

## 2017-11-25 DIAGNOSIS — L97822 Non-pressure chronic ulcer of other part of left lower leg with fat layer exposed: Secondary | ICD-10-CM | POA: Insufficient documentation

## 2017-11-25 DIAGNOSIS — I509 Heart failure, unspecified: Secondary | ICD-10-CM | POA: Diagnosis not present

## 2017-11-25 DIAGNOSIS — E1040 Type 1 diabetes mellitus with diabetic neuropathy, unspecified: Secondary | ICD-10-CM | POA: Insufficient documentation

## 2017-11-25 DIAGNOSIS — L89154 Pressure ulcer of sacral region, stage 4: Secondary | ICD-10-CM | POA: Diagnosis not present

## 2017-11-25 DIAGNOSIS — E1022 Type 1 diabetes mellitus with diabetic chronic kidney disease: Secondary | ICD-10-CM | POA: Insufficient documentation

## 2017-11-25 DIAGNOSIS — L980 Pyogenic granuloma: Secondary | ICD-10-CM | POA: Diagnosis not present

## 2017-11-26 DIAGNOSIS — N186 End stage renal disease: Secondary | ICD-10-CM | POA: Diagnosis not present

## 2017-11-26 DIAGNOSIS — D649 Anemia, unspecified: Secondary | ICD-10-CM | POA: Diagnosis not present

## 2017-11-26 DIAGNOSIS — N2581 Secondary hyperparathyroidism of renal origin: Secondary | ICD-10-CM | POA: Diagnosis not present

## 2017-11-28 ENCOUNTER — Ambulatory Visit (HOSPITAL_COMMUNITY): Payer: Medicare Other

## 2017-11-28 DIAGNOSIS — N186 End stage renal disease: Secondary | ICD-10-CM | POA: Diagnosis not present

## 2017-11-28 DIAGNOSIS — D649 Anemia, unspecified: Secondary | ICD-10-CM | POA: Diagnosis not present

## 2017-11-28 DIAGNOSIS — N2581 Secondary hyperparathyroidism of renal origin: Secondary | ICD-10-CM | POA: Diagnosis not present

## 2017-11-29 DIAGNOSIS — H6092 Unspecified otitis externa, left ear: Secondary | ICD-10-CM | POA: Diagnosis not present

## 2017-11-29 DIAGNOSIS — M26622 Arthralgia of left temporomandibular joint: Secondary | ICD-10-CM | POA: Diagnosis not present

## 2017-12-01 DIAGNOSIS — E1122 Type 2 diabetes mellitus with diabetic chronic kidney disease: Secondary | ICD-10-CM | POA: Diagnosis not present

## 2017-12-01 DIAGNOSIS — L89154 Pressure ulcer of sacral region, stage 4: Secondary | ICD-10-CM | POA: Diagnosis not present

## 2017-12-01 DIAGNOSIS — I132 Hypertensive heart and chronic kidney disease with heart failure and with stage 5 chronic kidney disease, or end stage renal disease: Secondary | ICD-10-CM | POA: Diagnosis not present

## 2017-12-01 DIAGNOSIS — T8789 Other complications of amputation stump: Secondary | ICD-10-CM | POA: Diagnosis not present

## 2017-12-01 DIAGNOSIS — D649 Anemia, unspecified: Secondary | ICD-10-CM | POA: Diagnosis not present

## 2017-12-01 DIAGNOSIS — N186 End stage renal disease: Secondary | ICD-10-CM | POA: Diagnosis not present

## 2017-12-01 DIAGNOSIS — I5032 Chronic diastolic (congestive) heart failure: Secondary | ICD-10-CM | POA: Diagnosis not present

## 2017-12-01 DIAGNOSIS — N2581 Secondary hyperparathyroidism of renal origin: Secondary | ICD-10-CM | POA: Diagnosis not present

## 2017-12-03 DIAGNOSIS — D649 Anemia, unspecified: Secondary | ICD-10-CM | POA: Diagnosis not present

## 2017-12-03 DIAGNOSIS — N2581 Secondary hyperparathyroidism of renal origin: Secondary | ICD-10-CM | POA: Diagnosis not present

## 2017-12-03 DIAGNOSIS — N186 End stage renal disease: Secondary | ICD-10-CM | POA: Diagnosis not present

## 2017-12-03 DIAGNOSIS — H66002 Acute suppurative otitis media without spontaneous rupture of ear drum, left ear: Secondary | ICD-10-CM | POA: Diagnosis not present

## 2017-12-04 ENCOUNTER — Ambulatory Visit (HOSPITAL_COMMUNITY)
Admission: RE | Admit: 2017-12-04 | Discharge: 2017-12-04 | Disposition: A | Discharge status: Home / Self Care | Payer: Medicare Other | Source: Ambulatory Visit | Attending: Nephrology | Admitting: Nephrology

## 2017-12-04 ENCOUNTER — Encounter (HOSPITAL_COMMUNITY): Payer: Self-pay | Admitting: Physician Assistant

## 2017-12-04 DIAGNOSIS — R188 Other ascites: Secondary | ICD-10-CM | POA: Insufficient documentation

## 2017-12-04 HISTORY — PX: IR PARACENTESIS: IMG2679

## 2017-12-04 MED ORDER — LIDOCAINE HCL (PF) 2 % IJ SOLN
INTRAMUSCULAR | Status: AC
  Filled 2017-12-04: qty 20

## 2017-12-04 MED ORDER — LIDOCAINE HCL (PF) 2 % IJ SOLN
INTRAMUSCULAR | Status: DC | PRN
  Administered 2017-12-04: 10 mL

## 2017-12-04 NOTE — Procedures (Signed)
PROCEDURE SUMMARY:  Successful image-guided paracentesis from the right abdomen.  Yielded 6.5 liters of clear yellow fluid.  No immediate complications.  Patient tolerated well.   Specimen was not sent for labs.  Joaquim Nam PA-C 12/04/2017 1:36 PM

## 2017-12-05 DIAGNOSIS — N186 End stage renal disease: Secondary | ICD-10-CM | POA: Diagnosis not present

## 2017-12-05 DIAGNOSIS — N2581 Secondary hyperparathyroidism of renal origin: Secondary | ICD-10-CM | POA: Diagnosis not present

## 2017-12-05 DIAGNOSIS — D649 Anemia, unspecified: Secondary | ICD-10-CM | POA: Diagnosis not present

## 2017-12-08 DIAGNOSIS — N2581 Secondary hyperparathyroidism of renal origin: Secondary | ICD-10-CM | POA: Diagnosis not present

## 2017-12-08 DIAGNOSIS — N186 End stage renal disease: Secondary | ICD-10-CM | POA: Diagnosis not present

## 2017-12-08 DIAGNOSIS — D649 Anemia, unspecified: Secondary | ICD-10-CM | POA: Diagnosis not present

## 2017-12-10 DIAGNOSIS — N186 End stage renal disease: Secondary | ICD-10-CM | POA: Diagnosis not present

## 2017-12-10 DIAGNOSIS — D649 Anemia, unspecified: Secondary | ICD-10-CM | POA: Diagnosis not present

## 2017-12-10 DIAGNOSIS — N2581 Secondary hyperparathyroidism of renal origin: Secondary | ICD-10-CM | POA: Diagnosis not present

## 2017-12-11 ENCOUNTER — Telehealth: Payer: Self-pay | Admitting: *Deleted

## 2017-12-11 DIAGNOSIS — I5032 Chronic diastolic (congestive) heart failure: Secondary | ICD-10-CM | POA: Diagnosis not present

## 2017-12-11 DIAGNOSIS — L89154 Pressure ulcer of sacral region, stage 4: Secondary | ICD-10-CM | POA: Diagnosis not present

## 2017-12-11 DIAGNOSIS — N186 End stage renal disease: Secondary | ICD-10-CM | POA: Diagnosis not present

## 2017-12-11 DIAGNOSIS — T8789 Other complications of amputation stump: Secondary | ICD-10-CM | POA: Diagnosis not present

## 2017-12-11 DIAGNOSIS — I132 Hypertensive heart and chronic kidney disease with heart failure and with stage 5 chronic kidney disease, or end stage renal disease: Secondary | ICD-10-CM | POA: Diagnosis not present

## 2017-12-11 DIAGNOSIS — E1122 Type 2 diabetes mellitus with diabetic chronic kidney disease: Secondary | ICD-10-CM | POA: Diagnosis not present

## 2017-12-11 NOTE — Telephone Encounter (Signed)
Phone call from [facility] Nurse asking if patient could be seen for possible need for oral antibiotic. Right stump wound" increase in drainage, foul odor and deeper wound". " I don't think she needs to go to the ER" Patient is planning to travel out of state over the week-end. Appt. With NP for 12/12/17 accepted.

## 2017-12-12 ENCOUNTER — Other Ambulatory Visit: Payer: Self-pay | Admitting: *Deleted

## 2017-12-12 ENCOUNTER — Encounter: Payer: Self-pay | Admitting: *Deleted

## 2017-12-12 ENCOUNTER — Encounter: Payer: Self-pay | Admitting: Family

## 2017-12-12 ENCOUNTER — Ambulatory Visit (INDEPENDENT_AMBULATORY_CARE_PROVIDER_SITE_OTHER): Payer: Medicare Other | Admitting: Family

## 2017-12-12 VITALS — BP 142/82 | HR 74 | Temp 97.3°F | Resp 16

## 2017-12-12 DIAGNOSIS — I779 Disorder of arteries and arterioles, unspecified: Secondary | ICD-10-CM

## 2017-12-12 DIAGNOSIS — N186 End stage renal disease: Secondary | ICD-10-CM | POA: Diagnosis not present

## 2017-12-12 DIAGNOSIS — I96 Gangrene, not elsewhere classified: Secondary | ICD-10-CM | POA: Diagnosis not present

## 2017-12-12 DIAGNOSIS — Z992 Dependence on renal dialysis: Secondary | ICD-10-CM

## 2017-12-12 DIAGNOSIS — N2581 Secondary hyperparathyroidism of renal origin: Secondary | ICD-10-CM | POA: Diagnosis not present

## 2017-12-12 DIAGNOSIS — D649 Anemia, unspecified: Secondary | ICD-10-CM | POA: Diagnosis not present

## 2017-12-12 DIAGNOSIS — Z89511 Acquired absence of right leg below knee: Secondary | ICD-10-CM | POA: Diagnosis not present

## 2017-12-12 NOTE — Patient Instructions (Signed)

## 2017-12-12 NOTE — Progress Notes (Signed)
[facility] [place]   CC: Follow upright BKA stump wound check, hx of peripheral artery occlusive disease  History of Present Illness [name] is a 42 y.o. female who presents for evaluation of right BKA stump. She has a wound care nurse come to her home once a week for checks. They had been using silver alginate and collagen wound care daily.   She is s/p right CFA, profunda, SFA and external iliac endarterectomy with bovine patch angioplasty on 04/16/17 and during that time, she became unstable and received CPR &was transferred to the ICU. Once she became more stable, she underwent a right TMA and exploration of her groin with evacuation of seroma and wound vac placement on 05/09/17. 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 Trula Slade.  They are using Santyl on the right BKA wound. Her fistula is working well for dialysis.    SClaudean Kinds PA-C last evaluated pt on 11-17-17. At that time wound continued to get smaller.  There was fibrinous exudate within the wound.  Dr. Trula Slade suggested using Santyl to help debride the wound.  She was to follow up with Dr. Trula Slade again in 6 weeks.  She was to contact us sooner if she needs to. -A prescription for santyl was sent electronically to her pharmacy.  She returns today for wound check.  3 days ago her right BKA stump started looking red, odor worse by yesterday when [facility] nurse from [facility] changed dressing. Also 3 days ago left great toe, 2nd toe,and 3rd toe started turning black, is worsening.  Pt denies fever or chills. States is feeling only slight pain from the stump and left foot. She finished amoxicillin yesterday for an ear infection. She is allergic to keflex and sulfa.   She dialyzes M-W-F. via left forearm AVF, [facility] at [facility].      Diabetic: Yes, states her last A1C was 8.?  Tobacco use: former smoker, quit in 2013   Pt meds include: Statin  :Yes Betablocker: Yes ASA: No Other anticoagulants/antiplatelets: no  Past Medical History:  Diagnosis Date  . Arthritis   . CHF (congestive heart failure) ([place])   . Chronic anemia    2nd to renal disease  . CIN III (cervical intraepithelial neoplasia grade III) with severe dysplasia    S/P LEEP AND CONE  . Coronary artery disease    Status post cardiac catheterization June 2012 scattered coronary artery disease/atherosclerosis with 70-80% stenosis in a small right PDA.  . Diabetic Charcot's joint disease ([place])   . DM (diabetes mellitus) ([place])    Long-term insulin  . End stage renal disease on dialysis [facility]) 05/02/11   "[facility]"; [facility]; M; W, F ("got it 04/15/2017 because of Thanksgiving")  . Fracture of 5th metatarsal 2016   Right  . Gastroparesis   . Heart murmur   . 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 History of abscesses in groins 12/06/2010  . Hyperlipidemia    Hypertriglyceridemia 449 HDL 25  . Hypertension   . Migraines    "just on dialysis days"  . Peripheral neuropathy    related to DM  . Peripheral vascular disease ([place])    Tibial occlusive disease evaluated by Dr. Kellie Simmering in August 2011. Medical therapy  . Renal insufficiency    Dialysis since 2012  . Seizures ([place])    "only when her sugar drops" (04/17/2017)  . Tobacco use disorder    Discontinued March  2012  . Trimalleolar fracture of ankle, closed 02/09/2015   Right    Social History Social History   Tobacco Use  . Smoking status: Former Smoker    Packs/day: 0.30    Years: 10.00    Pack years: 3.00    Types: Cigarettes    Last attempt to quit: 08/05/2011    Years since quitting: 6.3  . Smokeless tobacco: Never Used  Substance Use Topics  . Alcohol use: No  . Drug use: No    Family History Family History  Problem Relation Age of Onset  . Diabetes Mother   . Hypertension Mother   . Diabetes Father   . Hyperlipidemia Father   . Hypertension Father   . Diabetes Sister   . Stroke Maternal  Grandmother   . Diabetes Sister   . Breast cancer Maternal Aunt        Age 79's    Past Surgical History:  Procedure Laterality Date  . ABDOMINAL AORTOGRAM W/LOWER EXTREMITY [name] 03/27/2017   Procedure: ABDOMINAL AORTOGRAM W/LOWER EXTREMITY;  Surgeon: Conrad Maurice, MD;  Location: [place] [facility];  Service: Cardiovascular;  Laterality: [name];  . AMPUTATION Right 05/09/2017   Procedure: AMPUTATION TRANSMETATARSAL OF RIGHT FOOT;  Surgeon: Serafina Mitchell, MD;  Location: [place];  Service: Vascular;  Laterality: Right;  . AMPUTATION Right 06/17/2017   Procedure: AMPUTATION BELOW KNEE RIGHT;  Surgeon: Serafina Mitchell, MD;  Location: [facility];  Service: Vascular;  Laterality: Right;  . AV FISTULA PLACEMENT Left 04/2010   "lower arm"  . CARDIAC CATHETERIZATION [name] 02/10/2015   Procedure: Left Heart Cath and Coronary Angiography;  Surgeon: Dixie Dials, MD;  Location: [place] [facility];  Service: Cardiovascular;  Laterality: [name];  . CARDIAC CATHETERIZATION [name] 02/21/2016   Procedure: Right Heart Cath;  Surgeon: Jolaine Artist, MD;  Location: [place] [facility];  Service: Cardiovascular;  Laterality: [name];  . CATARACT EXTRACTION W/ INTRAOCULAR LENS  IMPLANT, BILATERAL Bilateral   . CERVICAL BIOPSY  W/ LOOP ELECTRODE EXCISION     h/o  . CERVICAL CONE BIOPSY     h/o  . COLPOSCOPY    . DILATION AND CURETTAGE OF UTERUS  2009  . ENDARTERECTOMY FEMORAL Right 04/16/2017   Procedure: RIGHT FEMORAL ENDARTERECTOMY;  Surgeon: Serafina Mitchell, MD;  Location: [place];  Service: Vascular;  Laterality: Right;  . FRACTURE SURGERY    . HERNIA REPAIR    . INCISION AND DRAINAGE ABSCESS     "groin"  . INSERTION OF DIALYSIS CATHETER Left 04/16/2017   Procedure: INSERTION of temporay DIALYSIS CATHETER, left femoral artery;  Surgeon: Serafina Mitchell, MD;  Location: [place];  Service: Vascular;  Laterality: Left;  . IR HYBRID TRAUMA EMBOLIZATION  04/16/2017  . IR PARACENTESIS  06/12/2017  . IR PARACENTESIS  06/24/2017   . IR PARACENTESIS  07/01/2017  . IR PARACENTESIS  07/10/2017  . IR PARACENTESIS  08/29/2017  . IR PARACENTESIS  09/18/2017  . IR PARACENTESIS  09/24/2017  . IR PARACENTESIS  10/10/2017  . IR PARACENTESIS  11/06/2017  . IR PARACENTESIS  12/04/2017  . ORIF ANKLE FRACTURE Right 02/16/2015   Procedure: OPEN REDUCTION INTERNAL FIXATION (ORIF) RIGHT ANKLE FRACTURE;  Surgeon: Altamese Afton, MD;  Location: [place];  Service: Orthopedics;  Laterality: Right;  . PATCH ANGIOPLASTY Right 04/16/2017   Procedure: PATCH ANGIOPLASTY OF RIGHT FEMORAL ARTERY USING BOVINE PERICARDIUM PATCH;  Surgeon: Serafina Mitchell, MD;  Location: [place];  Service: Vascular;  Laterality:  Right;  . UMBILICAL HERNIA REPAIR  X 2  . WOUND EXPLORATION Right 05/09/2017   Procedure: EXPLORATION RIGHT GROIN;  Surgeon: Serafina Mitchell, MD;  Location: [place];  Service: Vascular;  Laterality: Right;    Allergies  Allergen Reactions  . Cephalexin Other (See Comments)    Reaction unknown- Childhood allergy Tolerated Ceftriaxone, ceftazidime, zosyn, augmentin in the past  . Sulfamethoxazole-Trimethoprim Other (See Comments)    Unknown reaction. Pt states that she was told by her mother that she had allergy to Bactrim as a child.    Current Outpatient Medications  Medication Sig Dispense Refill  . acetaminophen (TYLENOL) 325 MG tablet Take 1-2 tablets (325-650 mg total) by mouth every 4 (four) hours as needed for mild pain.    Marland Kitchen amLODipine (NORVASC) 10 MG tablet Take 1 tablet (10 mg total) by mouth daily after supper. 30 tablet 0  . atorvastatin (LIPITOR) 40 MG tablet Take 1 tablet (40 mg total) by mouth daily at 6 PM. 30 tablet 0  . b complex-vitamin c-folic acid (NEPHRO-VITE) 0.8 MG TABS tablet Take 1 tablet by mouth daily.    . cinacalcet (SENSIPAR) 30 MG tablet Take 2 tablets (60 mg total) by mouth daily with supper. 60 tablet 0  . cinacalcet (SENSIPAR) 60 MG tablet TAKE 1 TABLET BY MOUTH DAILY WITH DINNER (DO NOT TAKE LESS THAN 12 HOURS  PRIOR TO DIALYSIS)    . cinacalcet (SENSIPAR) 60 MG tablet TAKE 1 TABLET BY MOUTH DAILY WITH DINNER (DO NOT TAKE LESS THAN 12 HOURS PRIOR TO DIALYSIS)  12  . cloNIDine (CATAPRES) 0.3 MG tablet Take 1 tablet (0.3 mg total) by mouth 3 (three) times daily. 90 tablet 0  . collagenase (SANTYL) ointment Apply 1 application topically daily. 30 g 1  . ferric citrate (AURYXIA) 1 GM 210 MG(Fe) tablet Take 2 tablets (420 mg total) by mouth 3 (three) times daily with meals. 270 tablet 0  . gabapentin (NEURONTIN) 100 MG capsule Take 1 capsule (100 mg total) by mouth 2 (two) times daily. 60 capsule 0  . HUMALOG KWIKPEN 100 UNIT/ML KiwkPen INJECT 6 TO 8 UNITS WITH MEALS SUBCUTANEOUS 30 DAYS  11  . hydrALAZINE (APRESOLINE) 100 MG tablet Take 1 tablet (100 mg total) by mouth every 8 (eight) hours. 90 tablet 1  . Insulin Glargine (LANTUS SOLOSTAR) 100 UNIT/ML Solostar Pen Inject 20 Units into the skin daily at 10 pm. 15 mL 2  . isosorbide mononitrate (IMDUR) 120 MG 24 hr tablet Take 120 mg by mouth daily. (does no take on MWF)  2  . labetalol (NORMODYNE) 300 MG tablet Take 1 tablet (300 mg total) by mouth 3 (three) times daily. 90 tablet 0  . LORazepam (ATIVAN) 0.5 MG tablet Take 1 tablet (0.5 mg total) by mouth every 12 (twelve) hours as needed for anxiety. 30 tablet 0  . sevelamer carbonate (RENVELA) 800 MG tablet Take 3 tablets (2,400 mg total) by mouth 3 (three) times daily with meals. Give 3 tablets to = 2400 mg TID 270 tablet 0  . amoxicillin-clavulanate (AUGMENTIN) 500-125 MG tablet Take 1 tablet (500 mg total) by mouth daily. For 7days (Patient not taking: Reported on 11/17/2017) 7 tablet 0  . doxycycline (VIBRA-TABS) 100 MG tablet Take 1 tablet (100 mg total) by mouth every 12 (twelve) hours. For 7days (Patient not taking: Reported on 12/12/2017) 14 tablet 0  . metoCLOPramide (REGLAN) 5 MG tablet Take 0.5 tablets (2.5 mg total) by mouth 3 (three) times daily before meals. (Patient  not taking: Reported on  12/12/2017) 45 tablet 0  . pantoprazole (PROTONIX) 40 MG tablet Take 1 tablet (40 mg total) by mouth daily. (Patient not taking: Reported on 12/12/2017) 30 tablet 0  . sertraline (ZOLOFT) 50 MG tablet Take 1 tablet (50 mg total) by mouth daily. (Patient not taking: Reported on 12/12/2017) 30 tablet 0   No current facility-administered medications for this visit.     ROS: See HPI for pertinent positives and negatives.   Physical Examination  Vitals:   12/12/17 1044  BP: (!) 142/82  Pulse: 74  Resp: 16  Temp: (!) 97.3 F (36.3 C)  TempSrc: Oral  SpO2: 97%   There is no height or weight on file to calculate BMI.  General: A&O x 3, WDWN, female. Gait: seated in w/c HEENT: No gross abnormalities.  Pulmonary: Respirations are non labored, CTAB, fair air movement in all fields Cardiac: regular rhythm, + murmur.         Carotid Bruits Right Left   Positive Negative   Radial pulses are 1+ palpable bilaterally   Adominal aortic pulse is not palpable       Left forearm AV fistula with palpable thrill                     Right BKA        VASCULAR EXAM: Extremities with ischemic changeswith dry Gangrene of left 1st, 2nd, and 3rd toes; with open wound of left BKA. See above photos.                                                                                                           LE Pulses Right Left       FEMORAL  2-3+ palpable  not palpable        POPLITEAL  not palpable, faint Doppler signal   not palpable       POSTERIOR TIBIAL  BKA   monophasic by Doppler and not palpable        DORSALIS PEDIS      ANTERIOR TIBIAL BKA monophasic by Doppler and not palpable       PERONEAL BKA No Doppler signal    Abdomen: softly distended, NT, no palpable masses. Skin: no rashes, see Extremities Musculoskeletal: no muscle wasting or atrophy. See Extremities  Neurologic: A&O X 3; appropriate affect, Sensation is diminished left foot, MOTOR FUNCTION:  moving all extremities  equally, motor strength 5/5 in upper extremities, 4/4 in lower extremities. Speech is fluent/normal. CN 2-12 intact. Psychiatric: Thought content is normal, mood appropriate for clinical situation.     ASSESSMENT: [name] is a 42 y.o. female who is s/p right CFA, profunda, SFA and external iliac endarterectomy with bovine patch angioplasty on 04/16/17 and during that time, she became unstable and received CPR &was transferred to the ICU. Once she became more stable, she underwent a right TMA and exploration of her groin with evacuation of seroma and wound vac placement on 05/09/17. This was non healing and she ultimately ended up with a  right BKA on 06/17/17.  Her right BKA stump has no fibrinous exudate, subcutaneous fat is exposed, moderate amount watery drainage, minimal swelling, mild erythema at wound edges. Left 1st, 2nd, and 3rd toes with dry gangrene. No consitutional sx's. No fever. Minimal pain.    DATA  None today   PLAN:  Based on the patient's vascular studies and examination and after Dr. Bridgett Larsson spoke with pt and husband and examined pt, pt will be scheduled for an aortogram with run off, possible intervention, soonest available surgeon, Dr. Trula Slade preferably.   I discussed in depth with the patient the nature of atherosclerosis, and emphasized the importance of maximal medical management including strict control of blood pressure, blood glucose, and lipid levels, obtaining regular exercise, and continued cessation of smoking.  The patient is aware that without maximal medical management the underlying atherosclerotic disease process will progress, limiting the benefit of any interventions.  The patient was given information about PAD including signs, symptoms, treatment, what symptoms should prompt the patient to seek immediate medical care, and risk reduction measures to take.  Clemon Chambers, RN, MSN, FNP-C [facility]  Phone: [phone]  Clinic MD: Lawana Chambers  12/12/17 11:17 AM

## 2017-12-12 NOTE — H&P (View-Only) (Signed)
[facility] OF [place]   CC: Follow upright BKA stump wound check, hx of peripheral artery occlusive disease  History of Present Illness [name] is a 42 y.o. female who presents for evaluation of right BKA stump. She has a wound care nurse come to her home once a week for checks. They had been using silver alginate and collagen wound care daily.   She is s/p right CFA, profunda, SFA and external iliac endarterectomy with bovine patch angioplasty on 04/16/17 and during that time, she became unstable and received CPR &was transferred to the ICU. Once she became more stable, she underwent a right TMA and exploration of her groin with evacuation of seroma and wound vac placement on 05/09/17. 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 Trula Slade.  They are using Santyl on the right BKA wound. Her fistula is working well for dialysis.    SClaudean Kinds PA-C last evaluated pt on 11-17-17. At that time wound continued to get smaller.  There was fibrinous exudate within the wound.  Dr. Trula Slade suggested using Santyl to help debride the wound.  She was to follow up with Dr. Trula Slade again in 6 weeks.  She was to contact us sooner if she needs to. -A prescription for santyl was sent electronically to her pharmacy.  She returns today for wound check.  3 days ago her right BKA stump started looking red, odor worse by yesterday when [facility] nurse from [facility] changed dressing. Also 3 days ago left great toe, 2nd toe,and 3rd toe started turning black, is worsening.  Pt denies fever or chills. States is feeling only slight pain from the stump and left foot. She finished amoxicillin yesterday for an ear infection. She is allergic to keflex and sulfa.   She dialyzes M-W-F. via left forearm AVF, [facility] at [facility].      Diabetic: Yes, states her last A1C was 8.?  Tobacco use: former smoker, quit in 2013   Pt meds include: Statin  :Yes Betablocker: Yes ASA: No Other anticoagulants/antiplatelets: no  Past Medical History:  Diagnosis Date  . Arthritis   . CHF (congestive heart failure) ([place])   . Chronic anemia    2nd to renal disease  . CIN III (cervical intraepithelial neoplasia grade III) with severe dysplasia    S/P LEEP AND CONE  . Coronary artery disease    Status post cardiac catheterization June 2012 scattered coronary artery disease/atherosclerosis with 70-80% stenosis in a small right PDA.  . Diabetic Charcot's joint disease ([place])   . DM (diabetes mellitus) ([place])    Long-term insulin  . End stage renal disease on dialysis [facility]) 05/02/11   "[facility]"; [facility]; M; W, F ("got it 04/15/2017 because of Thanksgiving")  . Fracture of 5th metatarsal 2016   Right  . Gastroparesis   . Heart murmur   . 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 History of abscesses in groins 12/06/2010  . Hyperlipidemia    Hypertriglyceridemia 449 HDL 25  . Hypertension   . Migraines    "just on dialysis days"  . Peripheral neuropathy    related to DM  . Peripheral vascular disease ([place])    Tibial occlusive disease evaluated by Dr. Kellie Simmering in August 2011. Medical therapy  . Renal insufficiency    Dialysis since 2012  . Seizures ([place])    "only when her sugar drops" (04/17/2017)  . Tobacco use disorder    Discontinued March  2012  . Trimalleolar fracture of ankle, closed 02/09/2015   Right    Social History Social History   Tobacco Use  . Smoking status: Former Smoker    Packs/day: 0.30    Years: 10.00    Pack years: 3.00    Types: Cigarettes    Last attempt to quit: 08/05/2011    Years since quitting: 6.3  . Smokeless tobacco: Never Used  Substance Use Topics  . Alcohol use: No  . Drug use: No    Family History Family History  Problem Relation Age of Onset  . Diabetes Mother   . Hypertension Mother   . Diabetes Father   . Hyperlipidemia Father   . Hypertension Father   . Diabetes Sister   . Stroke Maternal  Grandmother   . Diabetes Sister   . Breast cancer Maternal Aunt        Age 5's    Past Surgical History:  Procedure Laterality Date  . ABDOMINAL AORTOGRAM W/LOWER EXTREMITY [name] 03/27/2017   Procedure: ABDOMINAL AORTOGRAM W/LOWER EXTREMITY;  Surgeon: Conrad Burna, MD;  Location: [place] [facility];  Service: Cardiovascular;  Laterality: [name];  . AMPUTATION Right 05/09/2017   Procedure: AMPUTATION TRANSMETATARSAL OF RIGHT FOOT;  Surgeon: Serafina Mitchell, MD;  Location: [place];  Service: Vascular;  Laterality: Right;  . AMPUTATION Right 06/17/2017   Procedure: AMPUTATION BELOW KNEE RIGHT;  Surgeon: Serafina Mitchell, MD;  Location: [facility];  Service: Vascular;  Laterality: Right;  . AV FISTULA PLACEMENT Left 04/2010   "lower arm"  . CARDIAC CATHETERIZATION [name] 02/10/2015   Procedure: Left Heart Cath and Coronary Angiography;  Surgeon: Dixie Dials, MD;  Location: [place] [facility];  Service: Cardiovascular;  Laterality: [name];  . CARDIAC CATHETERIZATION [name] 02/21/2016   Procedure: Right Heart Cath;  Surgeon: Jolaine Artist, MD;  Location: [place] [facility];  Service: Cardiovascular;  Laterality: [name];  . CATARACT EXTRACTION W/ INTRAOCULAR LENS  IMPLANT, BILATERAL Bilateral   . CERVICAL BIOPSY  W/ LOOP ELECTRODE EXCISION     h/o  . CERVICAL CONE BIOPSY     h/o  . COLPOSCOPY    . DILATION AND CURETTAGE OF UTERUS  2009  . ENDARTERECTOMY FEMORAL Right 04/16/2017   Procedure: RIGHT FEMORAL ENDARTERECTOMY;  Surgeon: Serafina Mitchell, MD;  Location: [place];  Service: Vascular;  Laterality: Right;  . FRACTURE SURGERY    . HERNIA REPAIR    . INCISION AND DRAINAGE ABSCESS     "groin"  . INSERTION OF DIALYSIS CATHETER Left 04/16/2017   Procedure: INSERTION of temporay DIALYSIS CATHETER, left femoral artery;  Surgeon: Serafina Mitchell, MD;  Location: [place];  Service: Vascular;  Laterality: Left;  . IR HYBRID TRAUMA EMBOLIZATION  04/16/2017  . IR PARACENTESIS  06/12/2017  . IR PARACENTESIS  06/24/2017   . IR PARACENTESIS  07/01/2017  . IR PARACENTESIS  07/10/2017  . IR PARACENTESIS  08/29/2017  . IR PARACENTESIS  09/18/2017  . IR PARACENTESIS  09/24/2017  . IR PARACENTESIS  10/10/2017  . IR PARACENTESIS  11/06/2017  . IR PARACENTESIS  12/04/2017  . ORIF ANKLE FRACTURE Right 02/16/2015   Procedure: OPEN REDUCTION INTERNAL FIXATION (ORIF) RIGHT ANKLE FRACTURE;  Surgeon: Altamese Belle Meade, MD;  Location: [place];  Service: Orthopedics;  Laterality: Right;  . PATCH ANGIOPLASTY Right 04/16/2017   Procedure: PATCH ANGIOPLASTY OF RIGHT FEMORAL ARTERY USING BOVINE PERICARDIUM PATCH;  Surgeon: Serafina Mitchell, MD;  Location: [place];  Service: Vascular;  Laterality:  Right;  . UMBILICAL HERNIA REPAIR  X 2  . WOUND EXPLORATION Right 05/09/2017   Procedure: EXPLORATION RIGHT GROIN;  Surgeon: Serafina Mitchell, MD;  Location: [place];  Service: Vascular;  Laterality: Right;    Allergies  Allergen Reactions  . Cephalexin Other (See Comments)    Reaction unknown- Childhood allergy Tolerated Ceftriaxone, ceftazidime, zosyn, augmentin in the past  . Sulfamethoxazole-Trimethoprim Other (See Comments)    Unknown reaction. Pt states that she was told by her mother that she had allergy to Bactrim as a child.    Current Outpatient Medications  Medication Sig Dispense Refill  . acetaminophen (TYLENOL) 325 MG tablet Take 1-2 tablets (325-650 mg total) by mouth every 4 (four) hours as needed for mild pain.    Marland Kitchen amLODipine (NORVASC) 10 MG tablet Take 1 tablet (10 mg total) by mouth daily after supper. 30 tablet 0  . atorvastatin (LIPITOR) 40 MG tablet Take 1 tablet (40 mg total) by mouth daily at 6 PM. 30 tablet 0  . b complex-vitamin c-folic acid (NEPHRO-VITE) 0.8 MG TABS tablet Take 1 tablet by mouth daily.    . cinacalcet (SENSIPAR) 30 MG tablet Take 2 tablets (60 mg total) by mouth daily with supper. 60 tablet 0  . cinacalcet (SENSIPAR) 60 MG tablet TAKE 1 TABLET BY MOUTH DAILY WITH DINNER (DO NOT TAKE LESS THAN 12 HOURS  PRIOR TO DIALYSIS)    . cinacalcet (SENSIPAR) 60 MG tablet TAKE 1 TABLET BY MOUTH DAILY WITH DINNER (DO NOT TAKE LESS THAN 12 HOURS PRIOR TO DIALYSIS)  12  . cloNIDine (CATAPRES) 0.3 MG tablet Take 1 tablet (0.3 mg total) by mouth 3 (three) times daily. 90 tablet 0  . collagenase (SANTYL) ointment Apply 1 application topically daily. 30 g 1  . ferric citrate (AURYXIA) 1 GM 210 MG(Fe) tablet Take 2 tablets (420 mg total) by mouth 3 (three) times daily with meals. 270 tablet 0  . gabapentin (NEURONTIN) 100 MG capsule Take 1 capsule (100 mg total) by mouth 2 (two) times daily. 60 capsule 0  . HUMALOG KWIKPEN 100 UNIT/ML KiwkPen INJECT 6 TO 8 UNITS WITH MEALS SUBCUTANEOUS 30 DAYS  11  . hydrALAZINE (APRESOLINE) 100 MG tablet Take 1 tablet (100 mg total) by mouth every 8 (eight) hours. 90 tablet 1  . Insulin Glargine (LANTUS SOLOSTAR) 100 UNIT/ML Solostar Pen Inject 20 Units into the skin daily at 10 pm. 15 mL 2  . isosorbide mononitrate (IMDUR) 120 MG 24 hr tablet Take 120 mg by mouth daily. (does no take on MWF)  2  . labetalol (NORMODYNE) 300 MG tablet Take 1 tablet (300 mg total) by mouth 3 (three) times daily. 90 tablet 0  . LORazepam (ATIVAN) 0.5 MG tablet Take 1 tablet (0.5 mg total) by mouth every 12 (twelve) hours as needed for anxiety. 30 tablet 0  . sevelamer carbonate (RENVELA) 800 MG tablet Take 3 tablets (2,400 mg total) by mouth 3 (three) times daily with meals. Give 3 tablets to = 2400 mg TID 270 tablet 0  . amoxicillin-clavulanate (AUGMENTIN) 500-125 MG tablet Take 1 tablet (500 mg total) by mouth daily. For 7days (Patient not taking: Reported on 11/17/2017) 7 tablet 0  . doxycycline (VIBRA-TABS) 100 MG tablet Take 1 tablet (100 mg total) by mouth every 12 (twelve) hours. For 7days (Patient not taking: Reported on 12/12/2017) 14 tablet 0  . metoCLOPramide (REGLAN) 5 MG tablet Take 0.5 tablets (2.5 mg total) by mouth 3 (three) times daily before meals. (Patient  not taking: Reported on  12/12/2017) 45 tablet 0  . pantoprazole (PROTONIX) 40 MG tablet Take 1 tablet (40 mg total) by mouth daily. (Patient not taking: Reported on 12/12/2017) 30 tablet 0  . sertraline (ZOLOFT) 50 MG tablet Take 1 tablet (50 mg total) by mouth daily. (Patient not taking: Reported on 12/12/2017) 30 tablet 0   No current facility-administered medications for this visit.     ROS: See HPI for pertinent positives and negatives.   Physical Examination  Vitals:   12/12/17 1044  BP: (!) 142/82  Pulse: 74  Resp: 16  Temp: (!) 97.3 F (36.3 C)  TempSrc: Oral  SpO2: 97%   There is no height or weight on file to calculate BMI.  General: A&O x 3, WDWN, female. Gait: seated in w/c HEENT: No gross abnormalities.  Pulmonary: Respirations are non labored, CTAB, fair air movement in all fields Cardiac: regular rhythm, + murmur.         Carotid Bruits Right Left   Positive Negative   Radial pulses are 1+ palpable bilaterally   Adominal aortic pulse is not palpable       Left forearm AV fistula with palpable thrill                     Right BKA        VASCULAR EXAM: Extremities with ischemic changeswith dry Gangrene of left 1st, 2nd, and 3rd toes; with open wound of left BKA. See above photos.                                                                                                           LE Pulses Right Left       FEMORAL  2-3+ palpable  not palpable        POPLITEAL  not palpable, faint Doppler signal   not palpable       POSTERIOR TIBIAL  BKA   monophasic by Doppler and not palpable        DORSALIS PEDIS      ANTERIOR TIBIAL BKA monophasic by Doppler and not palpable       PERONEAL BKA No Doppler signal    Abdomen: softly distended, NT, no palpable masses. Skin: no rashes, see Extremities Musculoskeletal: no muscle wasting or atrophy. See Extremities  Neurologic: A&O X 3; appropriate affect, Sensation is diminished left foot, MOTOR FUNCTION:  moving all extremities  equally, motor strength 5/5 in upper extremities, 4/4 in lower extremities. Speech is fluent/normal. CN 2-12 intact. Psychiatric: Thought content is normal, mood appropriate for clinical situation.     ASSESSMENT: [name] is a 42 y.o. female who is s/p right CFA, profunda, SFA and external iliac endarterectomy with bovine patch angioplasty on 04/16/17 and during that time, she became unstable and received CPR &was transferred to the ICU. Once she became more stable, she underwent a right TMA and exploration of her groin with evacuation of seroma and wound vac placement on 05/09/17. This was non healing and she ultimately ended up with a  right BKA on 06/17/17.  Her right BKA stump has no fibrinous exudate, subcutaneous fat is exposed, moderate amount watery drainage, minimal swelling, mild erythema at wound edges. Left 1st, 2nd, and 3rd toes with dry gangrene. No consitutional sx's. No fever. Minimal pain.    DATA  None today   PLAN:  Based on the patient's vascular studies and examination and after Dr. Bridgett Larsson spoke with pt and husband and examined pt, pt will be scheduled for an aortogram with run off, possible intervention, soonest available surgeon, Dr. Trula Slade preferably.   I discussed in depth with the patient the nature of atherosclerosis, and emphasized the importance of maximal medical management including strict control of blood pressure, blood glucose, and lipid levels, obtaining regular exercise, and continued cessation of smoking.  The patient is aware that without maximal medical management the underlying atherosclerotic disease process will progress, limiting the benefit of any interventions.  The patient was given information about PAD including signs, symptoms, treatment, what symptoms should prompt the patient to seek immediate medical care, and risk reduction measures to take.  Clemon Chambers, RN, MSN, FNP-C [facility]  Phone: [phone]  Clinic MD: Lawana Chambers  12/12/17 11:17 AM

## 2017-12-15 DIAGNOSIS — I132 Hypertensive heart and chronic kidney disease with heart failure and with stage 5 chronic kidney disease, or end stage renal disease: Secondary | ICD-10-CM | POA: Diagnosis not present

## 2017-12-15 DIAGNOSIS — T8789 Other complications of amputation stump: Secondary | ICD-10-CM | POA: Diagnosis not present

## 2017-12-15 DIAGNOSIS — I5032 Chronic diastolic (congestive) heart failure: Secondary | ICD-10-CM | POA: Diagnosis not present

## 2017-12-15 DIAGNOSIS — E1122 Type 2 diabetes mellitus with diabetic chronic kidney disease: Secondary | ICD-10-CM | POA: Diagnosis not present

## 2017-12-15 DIAGNOSIS — N186 End stage renal disease: Secondary | ICD-10-CM | POA: Diagnosis not present

## 2017-12-15 DIAGNOSIS — N2581 Secondary hyperparathyroidism of renal origin: Secondary | ICD-10-CM | POA: Diagnosis not present

## 2017-12-15 DIAGNOSIS — D649 Anemia, unspecified: Secondary | ICD-10-CM | POA: Diagnosis not present

## 2017-12-15 DIAGNOSIS — L89154 Pressure ulcer of sacral region, stage 4: Secondary | ICD-10-CM | POA: Diagnosis not present

## 2017-12-16 ENCOUNTER — Inpatient Hospital Stay (HOSPITAL_COMMUNITY)
Admission: RE | Admit: 2017-12-16 | Discharge: 2017-12-25 | DRG: 474 | Disposition: E | Payer: Medicare Other | Attending: Surgery | Admitting: Surgery

## 2017-12-16 ENCOUNTER — Other Ambulatory Visit: Payer: Self-pay

## 2017-12-16 ENCOUNTER — Encounter (HOSPITAL_COMMUNITY): Admission: RE | Disposition: E | Payer: Self-pay | Source: Home / Self Care | Attending: Surgery

## 2017-12-16 ENCOUNTER — Encounter (HOSPITAL_COMMUNITY): Payer: Self-pay | Admitting: *Deleted

## 2017-12-16 DIAGNOSIS — E1143 Type 2 diabetes mellitus with diabetic autonomic (poly)neuropathy: Secondary | ICD-10-CM | POA: Diagnosis present

## 2017-12-16 DIAGNOSIS — Z1401 Asymptomatic hemophilia A carrier: Secondary | ICD-10-CM

## 2017-12-16 DIAGNOSIS — I425 Other restrictive cardiomyopathy: Secondary | ICD-10-CM | POA: Diagnosis present

## 2017-12-16 DIAGNOSIS — I462 Cardiac arrest due to underlying cardiac condition: Secondary | ICD-10-CM | POA: Diagnosis not present

## 2017-12-16 DIAGNOSIS — I472 Ventricular tachycardia: Secondary | ICD-10-CM | POA: Diagnosis not present

## 2017-12-16 DIAGNOSIS — Z794 Long term (current) use of insulin: Secondary | ICD-10-CM

## 2017-12-16 DIAGNOSIS — I4901 Ventricular fibrillation: Secondary | ICD-10-CM | POA: Diagnosis not present

## 2017-12-16 DIAGNOSIS — Z8249 Family history of ischemic heart disease and other diseases of the circulatory system: Secondary | ICD-10-CM

## 2017-12-16 DIAGNOSIS — I700 Atherosclerosis of aorta: Secondary | ICD-10-CM | POA: Diagnosis present

## 2017-12-16 DIAGNOSIS — D631 Anemia in chronic kidney disease: Secondary | ICD-10-CM | POA: Diagnosis present

## 2017-12-16 DIAGNOSIS — E781 Pure hyperglyceridemia: Secondary | ICD-10-CM | POA: Diagnosis present

## 2017-12-16 DIAGNOSIS — I96 Gangrene, not elsewhere classified: Secondary | ICD-10-CM | POA: Diagnosis not present

## 2017-12-16 DIAGNOSIS — I469 Cardiac arrest, cause unspecified: Secondary | ICD-10-CM

## 2017-12-16 DIAGNOSIS — K3184 Gastroparesis: Secondary | ICD-10-CM | POA: Diagnosis present

## 2017-12-16 DIAGNOSIS — E1152 Type 2 diabetes mellitus with diabetic peripheral angiopathy with gangrene: Secondary | ICD-10-CM | POA: Diagnosis present

## 2017-12-16 DIAGNOSIS — Y835 Amputation of limb(s) as the cause of abnormal reaction of the patient, or of later complication, without mention of misadventure at the time of the procedure: Secondary | ICD-10-CM

## 2017-12-16 DIAGNOSIS — N186 End stage renal disease: Secondary | ICD-10-CM | POA: Diagnosis present

## 2017-12-16 DIAGNOSIS — Z961 Presence of intraocular lens: Secondary | ICD-10-CM | POA: Diagnosis present

## 2017-12-16 DIAGNOSIS — Z9841 Cataract extraction status, right eye: Secondary | ICD-10-CM

## 2017-12-16 DIAGNOSIS — I70262 Atherosclerosis of native arteries of extremities with gangrene, left leg: Secondary | ICD-10-CM | POA: Diagnosis present

## 2017-12-16 DIAGNOSIS — I2510 Atherosclerotic heart disease of native coronary artery without angina pectoris: Secondary | ICD-10-CM | POA: Diagnosis present

## 2017-12-16 DIAGNOSIS — Z833 Family history of diabetes mellitus: Secondary | ICD-10-CM

## 2017-12-16 DIAGNOSIS — Z992 Dependence on renal dialysis: Secondary | ICD-10-CM

## 2017-12-16 DIAGNOSIS — Z882 Allergy status to sulfonamides status: Secondary | ICD-10-CM

## 2017-12-16 DIAGNOSIS — J9690 Respiratory failure, unspecified, unspecified whether with hypoxia or hypercapnia: Secondary | ICD-10-CM

## 2017-12-16 DIAGNOSIS — R188 Other ascites: Secondary | ICD-10-CM | POA: Diagnosis present

## 2017-12-16 DIAGNOSIS — T8753 Necrosis of amputation stump, right lower extremity: Secondary | ICD-10-CM | POA: Diagnosis present

## 2017-12-16 DIAGNOSIS — I509 Heart failure, unspecified: Secondary | ICD-10-CM | POA: Diagnosis present

## 2017-12-16 DIAGNOSIS — E11610 Type 2 diabetes mellitus with diabetic neuropathic arthropathy: Secondary | ICD-10-CM | POA: Diagnosis present

## 2017-12-16 DIAGNOSIS — T8743 Infection of amputation stump, right lower extremity: Principal | ICD-10-CM | POA: Diagnosis present

## 2017-12-16 DIAGNOSIS — Z87891 Personal history of nicotine dependence: Secondary | ICD-10-CM

## 2017-12-16 DIAGNOSIS — E872 Acidosis: Secondary | ICD-10-CM | POA: Diagnosis not present

## 2017-12-16 DIAGNOSIS — Z8349 Family history of other endocrine, nutritional and metabolic diseases: Secondary | ICD-10-CM

## 2017-12-16 DIAGNOSIS — Z803 Family history of malignant neoplasm of breast: Secondary | ICD-10-CM

## 2017-12-16 DIAGNOSIS — Z86001 Personal history of in-situ neoplasm of cervix uteri: Secondary | ICD-10-CM

## 2017-12-16 DIAGNOSIS — I272 Pulmonary hypertension, unspecified: Secondary | ICD-10-CM | POA: Diagnosis present

## 2017-12-16 DIAGNOSIS — Z881 Allergy status to other antibiotic agents status: Secondary | ICD-10-CM

## 2017-12-16 DIAGNOSIS — I132 Hypertensive heart and chronic kidney disease with heart failure and with stage 5 chronic kidney disease, or end stage renal disease: Secondary | ICD-10-CM | POA: Diagnosis present

## 2017-12-16 DIAGNOSIS — E875 Hyperkalemia: Secondary | ICD-10-CM | POA: Diagnosis present

## 2017-12-16 DIAGNOSIS — E1122 Type 2 diabetes mellitus with diabetic chronic kidney disease: Secondary | ICD-10-CM | POA: Diagnosis present

## 2017-12-16 DIAGNOSIS — N2581 Secondary hyperparathyroidism of renal origin: Secondary | ICD-10-CM | POA: Diagnosis present

## 2017-12-16 DIAGNOSIS — I252 Old myocardial infarction: Secondary | ICD-10-CM

## 2017-12-16 DIAGNOSIS — I739 Peripheral vascular disease, unspecified: Secondary | ICD-10-CM | POA: Diagnosis present

## 2017-12-16 DIAGNOSIS — Z9842 Cataract extraction status, left eye: Secondary | ICD-10-CM

## 2017-12-16 DIAGNOSIS — E785 Hyperlipidemia, unspecified: Secondary | ICD-10-CM | POA: Diagnosis present

## 2017-12-16 HISTORY — PX: ABDOMINAL AORTOGRAM W/LOWER EXTREMITY: CATH118223

## 2017-12-16 LAB — POCT I-STAT, CHEM 8
BUN: 63 mg/dL — AB (ref 6–20)
CALCIUM ION: 1.01 mmol/L — AB (ref 1.15–1.40)
CHLORIDE: 96 mmol/L — AB (ref 98–111)
Creatinine, Ser: 5.7 mg/dL — ABNORMAL HIGH (ref 0.44–1.00)
GLUCOSE: 126 mg/dL — AB (ref 70–99)
HCT: 31 % — ABNORMAL LOW (ref 36.0–46.0)
Hemoglobin: 10.5 g/dL — ABNORMAL LOW (ref 12.0–15.0)
POTASSIUM: 5.3 mmol/L — AB (ref 3.5–5.1)
Sodium: 136 mmol/L (ref 135–145)
TCO2: 32 mmol/L (ref 22–32)

## 2017-12-16 LAB — GLUCOSE, CAPILLARY
GLUCOSE-CAPILLARY: 147 mg/dL — AB (ref 70–99)
Glucose-Capillary: 115 mg/dL — ABNORMAL HIGH (ref 70–99)
Glucose-Capillary: 154 mg/dL — ABNORMAL HIGH (ref 70–99)

## 2017-12-16 SURGERY — ABDOMINAL AORTOGRAM W/LOWER EXTREMITY
Anesthesia: LOCAL

## 2017-12-16 MED ORDER — PIPERACILLIN-TAZOBACTAM 3.375 G IVPB
3.3750 g | Freq: Two times a day (BID) | INTRAVENOUS | Status: DC
  Administered 2017-12-16 – 2017-12-20 (×8): 3.375 g via INTRAVENOUS
  Filled 2017-12-16 (×11): qty 50

## 2017-12-16 MED ORDER — FERRIC CITRATE 1 GM 210 MG(FE) PO TABS
420.0000 mg | ORAL_TABLET | Freq: Three times a day (TID) | ORAL | Status: DC
  Administered 2017-12-16 – 2017-12-20 (×6): 420 mg via ORAL
  Filled 2017-12-16 (×15): qty 2

## 2017-12-16 MED ORDER — METOCLOPRAMIDE HCL 5 MG PO TABS: 2.5000 mg | ORAL_TABLET | Freq: Three times a day (TID) | ORAL | Status: DC

## 2017-12-16 MED ORDER — FENTANYL CITRATE (PF) 100 MCG/2ML IJ SOLN
INTRAMUSCULAR | Status: AC
  Filled 2017-12-16: qty 2

## 2017-12-16 MED ORDER — HYDROMORPHONE HCL 1 MG/ML IJ SOLN
INTRAMUSCULAR | Status: AC
  Filled 2017-12-16: qty 0.5

## 2017-12-16 MED ORDER — HYDRALAZINE HCL 20 MG/ML IJ SOLN: 5.0000 mg | INTRAMUSCULAR | Status: DC | PRN

## 2017-12-16 MED ORDER — HYDRALAZINE HCL 50 MG PO TABS
100.0000 mg | ORAL_TABLET | Freq: Three times a day (TID) | ORAL | Status: DC
  Administered 2017-12-16 – 2017-12-21 (×7): 100 mg via ORAL
  Filled 2017-12-16 (×9): qty 2

## 2017-12-16 MED ORDER — MIDAZOLAM HCL 2 MG/2ML IJ SOLN
INTRAMUSCULAR | Status: AC
  Filled 2017-12-16: qty 2

## 2017-12-16 MED ORDER — HEPARIN (PORCINE) IN NACL 1000-0.9 UT/500ML-% IV SOLN
INTRAVENOUS | Status: DC | PRN
  Administered 2017-12-16: 500 mL

## 2017-12-16 MED ORDER — LABETALOL HCL 5 MG/ML IV SOLN: 10.0000 mg | INTRAVENOUS | Status: DC | PRN

## 2017-12-16 MED ORDER — HEPARIN (PORCINE) IN NACL 1000-0.9 UT/500ML-% IV SOLN
INTRAVENOUS | Status: AC
  Filled 2017-12-16: qty 500

## 2017-12-16 MED ORDER — AMLODIPINE BESYLATE 10 MG PO TABS
10.0000 mg | ORAL_TABLET | Freq: Two times a day (BID) | ORAL | Status: DC
  Administered 2017-12-16 – 2017-12-20 (×5): 10 mg via ORAL
  Filled 2017-12-16 (×7): qty 1

## 2017-12-16 MED ORDER — ASPIRIN EC 81 MG PO TBEC
81.0000 mg | DELAYED_RELEASE_TABLET | Freq: Every day | ORAL | Status: DC
  Administered 2017-12-16 – 2017-12-20 (×4): 81 mg via ORAL
  Filled 2017-12-16 (×5): qty 1

## 2017-12-16 MED ORDER — MIDAZOLAM HCL 2 MG/2ML IJ SOLN
INTRAMUSCULAR | Status: DC | PRN
  Administered 2017-12-16 (×2): 2 mg via INTRAVENOUS

## 2017-12-16 MED ORDER — RENA-VITE PO TABS
1.0000 | ORAL_TABLET | Freq: Every day | ORAL | Status: DC
  Administered 2017-12-16 – 2017-12-20 (×5): 1 via ORAL
  Filled 2017-12-16 (×5): qty 1

## 2017-12-16 MED ORDER — LORAZEPAM 0.5 MG PO TABS
0.5000 mg | ORAL_TABLET | Freq: Every day | ORAL | Status: DC | PRN
Start: 2017-12-16 — End: 2017-12-21
  Administered 2017-12-17 – 2017-12-18 (×2): 0.5 mg via ORAL
  Filled 2017-12-16 (×3): qty 1

## 2017-12-16 MED ORDER — ACETAMINOPHEN 325 MG PO TABS: 650.0000 mg | ORAL_TABLET | ORAL | Status: DC | PRN

## 2017-12-16 MED ORDER — SODIUM CHLORIDE 0.9 % IV SOLN
1500.0000 mg | Freq: Once | INTRAVENOUS | Status: AC
  Administered 2017-12-17: 1500 mg via INTRAVENOUS
  Filled 2017-12-16: qty 1500

## 2017-12-16 MED ORDER — ISOSORBIDE MONONITRATE ER 60 MG PO TB24
120.0000 mg | ORAL_TABLET | Freq: Every day | ORAL | Status: DC
  Administered 2017-12-16 – 2017-12-20 (×3): 120 mg via ORAL
  Filled 2017-12-16 (×3): qty 2

## 2017-12-16 MED ORDER — INSULIN ASPART 100 UNIT/ML ~~LOC~~ SOLN
0.0000 [IU] | Freq: Three times a day (TID) | SUBCUTANEOUS | Status: DC
  Administered 2017-12-16 – 2017-12-18 (×2): 2 [IU] via SUBCUTANEOUS
  Administered 2017-12-19 (×2): 3 [IU] via SUBCUTANEOUS
  Administered 2017-12-20: 5 [IU] via SUBCUTANEOUS
  Administered 2017-12-20 – 2017-12-21 (×3): 3 [IU] via SUBCUTANEOUS

## 2017-12-16 MED ORDER — CINACALCET HCL 30 MG PO TABS
60.0000 mg | ORAL_TABLET | Freq: Every day | ORAL | Status: DC
  Administered 2017-12-20: 60 mg via ORAL
  Filled 2017-12-16 (×3): qty 2

## 2017-12-16 MED ORDER — CHLORHEXIDINE GLUCONATE CLOTH 2 % EX PADS
6.0000 | MEDICATED_PAD | Freq: Every day | CUTANEOUS | Status: DC
  Administered 2017-12-17 – 2017-12-19 (×3): 6 via TOPICAL

## 2017-12-16 MED ORDER — PANTOPRAZOLE SODIUM 40 MG PO TBEC: 40.0000 mg | DELAYED_RELEASE_TABLET | Freq: Every day | ORAL | Status: DC

## 2017-12-16 MED ORDER — OXYCODONE HCL 5 MG PO TABS
5.0000 mg | ORAL_TABLET | ORAL | Status: DC | PRN
  Administered 2017-12-16 – 2017-12-18 (×4): 10 mg via ORAL
  Administered 2017-12-19: 5 mg via ORAL
  Administered 2017-12-20: 10 mg via ORAL
  Filled 2017-12-16 (×3): qty 2
  Filled 2017-12-16: qty 1
  Filled 2017-12-16 (×2): qty 2

## 2017-12-16 MED ORDER — FENTANYL CITRATE (PF) 100 MCG/2ML IJ SOLN
INTRAMUSCULAR | Status: DC | PRN
  Administered 2017-12-16 (×2): 50 ug via INTRAVENOUS

## 2017-12-16 MED ORDER — SODIUM CHLORIDE 0.9 % IV SOLN
250.0000 mL | INTRAVENOUS | Status: DC | PRN
  Administered 2017-12-16: 250 mL via INTRAVENOUS

## 2017-12-16 MED ORDER — LIDOCAINE HCL (PF) 1 % IJ SOLN
INTRAMUSCULAR | Status: DC | PRN
  Administered 2017-12-16: 18 mL

## 2017-12-16 MED ORDER — SODIUM CHLORIDE 0.9% FLUSH: 3.0000 mL | INTRAVENOUS | Status: DC | PRN

## 2017-12-16 MED ORDER — SEVELAMER CARBONATE 800 MG PO TABS
2400.0000 mg | ORAL_TABLET | Freq: Three times a day (TID) | ORAL | Status: DC
  Administered 2017-12-16 – 2017-12-20 (×6): 2400 mg via ORAL
  Filled 2017-12-16 (×8): qty 3

## 2017-12-16 MED ORDER — SODIUM CHLORIDE 0.9% FLUSH
3.0000 mL | Freq: Two times a day (BID) | INTRAVENOUS | Status: DC
  Administered 2017-12-16: 3 mL via INTRAVENOUS

## 2017-12-16 MED ORDER — CLONIDINE HCL 0.2 MG PO TABS
0.3000 mg | ORAL_TABLET | Freq: Three times a day (TID) | ORAL | Status: DC
  Administered 2017-12-16 – 2017-12-20 (×6): 0.3 mg via ORAL
  Filled 2017-12-16 (×11): qty 1

## 2017-12-16 MED ORDER — SERTRALINE HCL 50 MG PO TABS: 50.0000 mg | ORAL_TABLET | Freq: Every day | ORAL | Status: DC

## 2017-12-16 MED ORDER — LABETALOL HCL 300 MG PO TABS
300.0000 mg | ORAL_TABLET | Freq: Three times a day (TID) | ORAL | Status: DC
  Administered 2017-12-16 – 2017-12-20 (×6): 300 mg via ORAL
  Filled 2017-12-16 (×10): qty 1

## 2017-12-16 MED ORDER — IODIXANOL 320 MG/ML IV SOLN
INTRAVENOUS | Status: DC | PRN
  Administered 2017-12-16: 140 mL via INTRA_ARTERIAL

## 2017-12-16 MED ORDER — ATORVASTATIN CALCIUM 40 MG PO TABS
40.0000 mg | ORAL_TABLET | Freq: Every day | ORAL | Status: DC
  Administered 2017-12-16 – 2017-12-20 (×4): 40 mg via ORAL
  Filled 2017-12-16 (×4): qty 1

## 2017-12-16 MED ORDER — LIDOCAINE HCL (PF) 1 % IJ SOLN
INTRAMUSCULAR | Status: AC
  Filled 2017-12-16: qty 30

## 2017-12-16 MED ORDER — ONDANSETRON HCL 4 MG/2ML IJ SOLN: 4.0000 mg | Freq: Four times a day (QID) | INTRAMUSCULAR | Status: DC | PRN

## 2017-12-16 MED ORDER — ACETAMINOPHEN 500 MG PO TABS: 1000.0000 mg | ORAL_TABLET | Freq: Four times a day (QID) | ORAL | Status: DC | PRN

## 2017-12-16 MED ORDER — HYDROMORPHONE HCL 1 MG/ML IJ SOLN
0.5000 mg | INTRAMUSCULAR | Status: DC | PRN
  Administered 2017-12-16: 0.5 mg via INTRAVENOUS
  Administered 2017-12-16: 1 mg via INTRAVENOUS
  Administered 2017-12-16: 0.5 mg via INTRAVENOUS
  Administered 2017-12-16 – 2017-12-18 (×4): 1 mg via INTRAVENOUS
  Administered 2017-12-18: 0.5 mg via INTRAVENOUS
  Administered 2017-12-18: 1 mg via INTRAVENOUS
  Filled 2017-12-16 (×7): qty 1

## 2017-12-16 MED ORDER — GABAPENTIN 100 MG PO CAPS
100.0000 mg | ORAL_CAPSULE | Freq: Two times a day (BID) | ORAL | Status: DC
  Administered 2017-12-16 – 2017-12-18 (×3): 100 mg via ORAL
  Filled 2017-12-16 (×3): qty 1

## 2017-12-16 SURGICAL SUPPLY — 10 items
CATH OMNI FLUSH 5F 65CM (CATHETERS) ×1 IMPLANT
DRAPE ZERO GRAVITY STERILE (DRAPES) ×1 IMPLANT
KIT MICROPUNCTURE NIT STIFF (SHEATH) ×2 IMPLANT
KIT PV (KITS) ×2 IMPLANT
SHEATH PINNACLE 6F 10CM (SHEATH) ×1 IMPLANT
SHEATH PROBE COVER 6X72 (BAG) ×1 IMPLANT
SYR MEDRAD MARK V 150ML (SYRINGE) ×2 IMPLANT
TRANSDUCER W/STOPCOCK (MISCELLANEOUS) ×2 IMPLANT
TRAY PV CATH (CUSTOM PROCEDURE TRAY) ×2 IMPLANT
WIRE BENTSON .035X145CM (WIRE) ×2 IMPLANT

## 2017-12-16 NOTE — Progress Notes (Addendum)
    Subjective  -   Status post angiogram earlier today   Physical Exam:  Gangrenous changes to the left second and third toe       Assessment/Plan:    I discussed proceeding with left femoral endarterectomy and left superficial femoral artery stent tomorrow in the operating room.  Simultaneously I will perform amputation of the necessary digits.  I also discussed that I may debride her below-knee amputation.  I spoke with nephrology (Dr. Augustin Coupe), and she will go for dialysis after her operation.  I spoke with anesthesia tonight to give them a heads up regarding the possible need for spinal/epidural anesthesia given her issues under general anesthesia several months ago.  I am also starting her on vancomycin and Zosyn for broad-spectrum antibiotic coverage of her digits on the left.  Wells Kimie Pidcock 12/08/2017 8:10 PM --  Vitals:   12/13/2017 1630 12/23/2017 1934  BP: 115/69 (!) 109/55  Pulse: 74 (!) 149  Resp:  19  Temp: 98 F (36.7 C) 98 F (36.7 C)  SpO2: 94% 96%    Intake/Output Summary (Last 24 hours) at 12/05/2017 2010 Last data filed at 12/15/2017 1900 Gross per 24 hour  Intake 120 ml  Output -  Net 120 ml     Laboratory CBC    Component Value Date/Time   WBC 10.1 09/26/2017 0835   HGB 10.5 (L) 12/14/2017 1014   HCT 31.0 (L) 11/28/2017 1014   PLT 259 09/26/2017 0835    BMET    Component Value Date/Time   NA 136 12/08/2017 1014   K 5.3 (H) 11/26/2017 1014   CL 96 (L) 12/12/2017 1014   CO2 25 09/26/2017 0836   GLUCOSE 126 (H) 11/26/2017 1014   BUN 63 (H) 12/15/2017 1014   CREATININE 5.70 (H) 12/04/2017 1014   CALCIUM 7.1 (L) 09/26/2017 0836   GFRNONAA 8 (L) 09/26/2017 0836   GFRAA 9 (L) 09/26/2017 0836    COAG Lab Results  Component Value Date   INR 1.14 06/17/2017   INR 1.20 06/11/2017   INR 1.19 05/09/2017   No results found for: PTT  Antibiotics Anti-infectives (From admission, onward)   None       V. Leia Alf, M.D. [facility] [place] Office: [phone] Pager:  [phone]

## 2017-12-16 NOTE — Progress Notes (Signed)
Patient received from cath lab, A&Ox4,VSS. Telemetry applied and ccmd notified.

## 2017-12-16 NOTE — Interval H&P Note (Signed)
History and Physical Interval Note:  12/03/2017 10:37 AM  [name]  has presented today for surgery, with the diagnosis of pvd   gangrene  The various methods of treatment have been discussed with the patient and family. After consideration of risks, benefits and other options for treatment, the patient has consented to  Procedure(s): ABDOMINAL AORTOGRAM W/LOWER EXTREMITY ([name]) as a surgical intervention .  The patient's history has been reviewed, patient examined, no change in status, stable for surgery.  I have reviewed the patient's chart and labs.  Questions were answered to the patient's satisfaction.     [name]

## 2017-12-16 NOTE — Op Note (Signed)
    Patient name: [name] MRN: [id number] DOB: 03-04-1976 Sex: female  12/22/2017 Pre-operative Diagnosis: Left toe gangrene Post-operative diagnosis:  Same Surgeon:  [name] Procedure Performed:  1.  Ultrasound-guided access, right femoral artery  2.  Abdominal aortogram  3.  Bilateral lower extremity runoff  4.  Second-order catheterization  5.  Conscious sedation of (34 minutes)   Indications: Patient is previously undergone a right below-knee amputation.  She recently had a discoloration on the toes on the left.  She is here for arteriogram.  Procedure:  The patient was identified in the holding area and taken to room 8.  The patient was then placed supine on the table and prepped and draped in the usual sterile fashion.  A time out was called.  Conscious sedation was administered with the use of IV fentanyl and Versed under continuous physician and nurse monitoring.  Heart rate, blood pressure, and oxygen saturation were continuously monitored.  Ultrasound was used to evaluate the right common femoral artery.  It was patent .  A digital ultrasound image was acquired.  A micropuncture needle was used to access the right common femoral artery under ultrasound guidance.  An 018 wire was advanced without resistance and a micropuncture sheath was placed.  The 018 wire was removed and a benson wire was placed.  The micropuncture sheath was exchanged for a 5 french sheath.  An omniflush catheter was advanced over the wire to the level of L-1.  An abdominal angiogram was obtained.  Next, using the omniflush catheter and a benson wire, the aortic bifurcation was crossed and the catheter was placed into the left external iliac artery and left runoff was obtained.  right runoff was performed via retrograde sheath injections.  Findings:   Aortogram: No significant renal artery stenosis.  The infrarenal abdominal aorta is heavily calcified.  There is luminal narrowing that does not appear to  be hemodynamically significant the midportion of the aorta.  Bilateral common and external iliac arteries are patent without stenosis but heavily calcified.  Right Lower Extremity:   The right common femoral and profundofemoral artery are widely patent as is the associated patch angioplasty repair.  There is diffuse disease throughout the superficial femoral artery which occludes at the level of the amputation site.  Left Lower Extremity: The left common femoral artery has a 90% stenosis at its distal extent.  The profundofemoral and superficial femoral artery are patent throughout the course however there is a high-grade stenosis in the superficial femoral artery at the adductor canal.  The tibial vessels are all patent but heavily calcified  Intervention: None  Impression:  #1 no significant aortoiliac occlusive disease  #2 90% left common femoral artery stenosis  #3 high-grade left superficial femoral artery stenosis  #4 three-vessel runoff however poor opacification of the vessels out onto the foot    V. [name], M.D. [facility] [place] Office: [phone] Pager:  [phone]

## 2017-12-16 NOTE — Progress Notes (Signed)
Pharmacy Antibiotic Note  [name] is a 42 y.o. female admitted on 12/09/2017 with gangrenous changes to L 2nd, 3rd toes.  Pharmacy has been consulted for Vancomycin and Zosyn dosing.  Pt has ESRD and dialyzes on MWF outpatient.  Noted pland for OR 7/24.  Plan: Zosyn 3.375 gm IV q12h (4 hour infusion). Vanc 1500mg  x 1 dose pre-op 7/24 Follow-up if Vanc to continue post-op.  The consult indication is entered as surgical ppx.  Height: 6\' 4"  (193 cm) Weight: 175 lb (79.4 kg) IBW/kg (Calculated) : 82.3  Temp (24hrs), Avg:97.8 F (36.6 C), Min:97.4 F (36.3 C), Max:98 F (36.7 C)  Recent Labs  Lab 12/17/2017 1014  CREATININE 5.70*    Estimated Creatinine Clearance: 16.3 mL/min (A) (by C-G formula based on SCr of 5.7 mg/dL (H)).    Allergies  Allergen Reactions  . Cephalexin Other (See Comments)    Reaction unknown- Childhood allergy Tolerated Ceftriaxone, ceftazidime, zosyn, augmentin in the past  . Sulfamethoxazole-Trimethoprim Other (See Comments)    Unknown reaction. Pt states that she was told by her mother that she had allergy to Bactrim as a child.    Antimicrobials this admission: Vanc x 1 7/24 Zosyn 7/23 >>  Dose adjustments this admission:   Microbiology results:  Thank you for allowing pharmacy to be a part of this patient's care.  [employer], Pharm.D., BCPS Clinical Pharmacist Pager: [phone] Clinical phone for 12/02/2017 is [phone].  **Pharmacist phone directory can now be found on [email or web address] ([email or web address]).  Listed under [place].  11/26/2017 8:44 PM

## 2017-12-16 NOTE — Discharge Instructions (Signed)
**Note -identified via Obfuscation** Femoral Site Care °Refer to this sheet in the next few weeks. These instructions provide you with information about caring for yourself after your procedure. Your health care provider may also give you more specific instructions. Your treatment has been planned according to current medical practices, but problems sometimes occur. Call your health care provider if you have any problems or questions after your procedure. °What can I expect after the procedure? °After your procedure, it is typical to have the following: °· Bruising at the site that usually fades within 1-2 weeks. °· Blood collecting in the tissue (hematoma) that may be painful to the touch. It should usually decrease in size and tenderness within 1-2 weeks. ° °Follow these instructions at home: °· Take medicines only as directed by your health care provider. °· You may shower 24-48 hours after the procedure or as directed by your health care provider. Remove the bandage (dressing) and gently wash the site with plain soap and water. Pat the area dry with a clean towel. Do not rub the site, because this may cause bleeding. °· Do not take baths, swim, or use a hot tub until your health care provider approves. °· Check your insertion site every day for redness, swelling, or drainage. °· Do not apply powder or lotion to the site. °· Limit use of stairs to twice a day for the first 2-3 days or as directed by your health care provider. °· Do not squat for the first 2-3 days or as directed by your health care provider. °· Do not lift over 10 lb (4.5 kg) for 5 days after your procedure or as directed by your health care provider. °· Ask your health care provider when it is okay to: °? Return to work or school. °? Resume usual physical activities or sports. °? Resume sexual activity. °· Do not drive home if you are discharged the same day as the procedure. Have someone else drive you. °· You may drive 24 hours after the procedure unless otherwise instructed by  your health care provider. °· Do not operate machinery or power tools for 24 hours after the procedure or as directed by your health care provider. °· If your procedure was done as an outpatient procedure, which means that you went home the same day as your procedure, a responsible adult should be with you for the first 24 hours after you arrive home. °· Keep all follow-up visits as directed by your health care provider. This is important. °Contact a health care provider if: °· You have a fever. °· You have chills. °· You have increased bleeding from the site. Hold pressure on the site. °Get help right away if: °· You have unusual pain at the site. °· You have redness, warmth, or swelling at the site. °· You have drainage (other than a small amount of blood on the dressing) from the site. °· The site is bleeding, and the bleeding does not stop after 30 minutes of holding steady pressure on the site. °· Your leg or foot becomes pale, cool, tingly, or numb. °This information is not intended to replace advice given to you by your health care provider. Make sure you discuss any questions you have with your health care provider. °Document Released: 01/14/2014 Document Revised: 10/19/2015 Document Reviewed: 11/30/2013 °Elsevier Interactive Patient Education © 2018 [employer] ° °

## 2017-12-17 ENCOUNTER — Encounter (HOSPITAL_COMMUNITY): Admission: RE | Disposition: E | Payer: Self-pay | Source: Home / Self Care | Attending: Surgery

## 2017-12-17 ENCOUNTER — Observation Stay (HOSPITAL_COMMUNITY): Payer: Medicare Other | Admitting: Certified Registered Nurse Anesthetist

## 2017-12-17 ENCOUNTER — Encounter (HOSPITAL_COMMUNITY): Payer: Self-pay | Admitting: Surgery

## 2017-12-17 DIAGNOSIS — I425 Other restrictive cardiomyopathy: Secondary | ICD-10-CM | POA: Diagnosis present

## 2017-12-17 DIAGNOSIS — I509 Heart failure, unspecified: Secondary | ICD-10-CM | POA: Diagnosis present

## 2017-12-17 DIAGNOSIS — E872 Acidosis: Secondary | ICD-10-CM | POA: Diagnosis not present

## 2017-12-17 DIAGNOSIS — I132 Hypertensive heart and chronic kidney disease with heart failure and with stage 5 chronic kidney disease, or end stage renal disease: Secondary | ICD-10-CM | POA: Diagnosis present

## 2017-12-17 DIAGNOSIS — Y835 Amputation of limb(s) as the cause of abnormal reaction of the patient, or of later complication, without mention of misadventure at the time of the procedure: Secondary | ICD-10-CM | POA: Diagnosis not present

## 2017-12-17 DIAGNOSIS — I96 Gangrene, not elsewhere classified: Secondary | ICD-10-CM | POA: Diagnosis not present

## 2017-12-17 DIAGNOSIS — I469 Cardiac arrest, cause unspecified: Secondary | ICD-10-CM | POA: Diagnosis not present

## 2017-12-17 DIAGNOSIS — E1122 Type 2 diabetes mellitus with diabetic chronic kidney disease: Secondary | ICD-10-CM | POA: Diagnosis present

## 2017-12-17 DIAGNOSIS — N186 End stage renal disease: Secondary | ICD-10-CM | POA: Diagnosis present

## 2017-12-17 DIAGNOSIS — I2510 Atherosclerotic heart disease of native coronary artery without angina pectoris: Secondary | ICD-10-CM | POA: Diagnosis present

## 2017-12-17 DIAGNOSIS — T8753 Necrosis of amputation stump, right lower extremity: Secondary | ICD-10-CM | POA: Diagnosis present

## 2017-12-17 DIAGNOSIS — I5033 Acute on chronic diastolic (congestive) heart failure: Secondary | ICD-10-CM | POA: Diagnosis not present

## 2017-12-17 DIAGNOSIS — I700 Atherosclerosis of aorta: Secondary | ICD-10-CM | POA: Diagnosis present

## 2017-12-17 DIAGNOSIS — N2581 Secondary hyperparathyroidism of renal origin: Secondary | ICD-10-CM | POA: Diagnosis present

## 2017-12-17 DIAGNOSIS — T8781 Dehiscence of amputation stump: Secondary | ICD-10-CM | POA: Diagnosis not present

## 2017-12-17 DIAGNOSIS — K3184 Gastroparesis: Secondary | ICD-10-CM | POA: Diagnosis present

## 2017-12-17 DIAGNOSIS — D631 Anemia in chronic kidney disease: Secondary | ICD-10-CM | POA: Diagnosis present

## 2017-12-17 DIAGNOSIS — Z87891 Personal history of nicotine dependence: Secondary | ICD-10-CM | POA: Diagnosis not present

## 2017-12-17 DIAGNOSIS — E1143 Type 2 diabetes mellitus with diabetic autonomic (poly)neuropathy: Secondary | ICD-10-CM | POA: Diagnosis present

## 2017-12-17 DIAGNOSIS — E875 Hyperkalemia: Secondary | ICD-10-CM | POA: Diagnosis present

## 2017-12-17 DIAGNOSIS — I120 Hypertensive chronic kidney disease with stage 5 chronic kidney disease or end stage renal disease: Secondary | ICD-10-CM | POA: Diagnosis not present

## 2017-12-17 DIAGNOSIS — T8743 Infection of amputation stump, right lower extremity: Secondary | ICD-10-CM | POA: Diagnosis present

## 2017-12-17 DIAGNOSIS — I70262 Atherosclerosis of native arteries of extremities with gangrene, left leg: Secondary | ICD-10-CM | POA: Diagnosis present

## 2017-12-17 DIAGNOSIS — I472 Ventricular tachycardia: Secondary | ICD-10-CM | POA: Diagnosis not present

## 2017-12-17 DIAGNOSIS — E785 Hyperlipidemia, unspecified: Secondary | ICD-10-CM | POA: Diagnosis present

## 2017-12-17 DIAGNOSIS — I252 Old myocardial infarction: Secondary | ICD-10-CM | POA: Diagnosis not present

## 2017-12-17 DIAGNOSIS — E1152 Type 2 diabetes mellitus with diabetic peripheral angiopathy with gangrene: Secondary | ICD-10-CM | POA: Diagnosis present

## 2017-12-17 DIAGNOSIS — R188 Other ascites: Secondary | ICD-10-CM | POA: Diagnosis present

## 2017-12-17 DIAGNOSIS — E781 Pure hyperglyceridemia: Secondary | ICD-10-CM | POA: Diagnosis present

## 2017-12-17 DIAGNOSIS — Z992 Dependence on renal dialysis: Secondary | ICD-10-CM | POA: Diagnosis not present

## 2017-12-17 DIAGNOSIS — E11610 Type 2 diabetes mellitus with diabetic neuropathic arthropathy: Secondary | ICD-10-CM | POA: Diagnosis present

## 2017-12-17 HISTORY — PX: INSERTION OF ILIAC STENT: SHX6256

## 2017-12-17 HISTORY — PX: AMPUTATION: SHX166

## 2017-12-17 HISTORY — PX: IRRIGATION AND DEBRIDEMENT KNEE: SHX5185

## 2017-12-17 HISTORY — PX: PATCH ANGIOPLASTY: SHX6230

## 2017-12-17 HISTORY — PX: ENDARTERECTOMY FEMORAL: SHX5804

## 2017-12-17 LAB — POCT I-STAT 7, (LYTES, BLD GAS, ICA,H+H)
Acid-Base Excess: 1 mmol/L (ref 0.0–2.0)
Bicarbonate: 24.2 mmol/L (ref 20.0–28.0)
Bicarbonate: 24.6 mmol/L (ref 20.0–28.0)
CALCIUM ION: 1.09 mmol/L — AB (ref 1.15–1.40)
Calcium, Ion: 1.06 mmol/L — ABNORMAL LOW (ref 1.15–1.40)
HCT: 30 % — ABNORMAL LOW (ref 36.0–46.0)
HEMATOCRIT: 28 % — AB (ref 36.0–46.0)
HEMOGLOBIN: 9.5 g/dL — AB (ref 12.0–15.0)
Hemoglobin: 10.2 g/dL — ABNORMAL LOW (ref 12.0–15.0)
O2 SAT: 98 %
O2 Saturation: 96 %
PCO2 ART: 32.6 mmHg (ref 32.0–48.0)
PCO2 ART: 40.7 mmHg (ref 32.0–48.0)
PO2 ART: 103 mmHg (ref 83.0–108.0)
POTASSIUM: 6.1 mmol/L — AB (ref 3.5–5.1)
Potassium: 6.3 mmol/L (ref 3.5–5.1)
Sodium: 134 mmol/L — ABNORMAL LOW (ref 135–145)
Sodium: 135 mmol/L (ref 135–145)
TCO2: 25 mmol/L (ref 22–32)
TCO2: 26 mmol/L (ref 22–32)
pH, Arterial: 7.479 — ABNORMAL HIGH (ref 7.350–7.450)
pH, Arterial: 7.39 (ref 7.350–7.450)
pO2, Arterial: 75 mmHg — ABNORMAL LOW (ref 83.0–108.0)

## 2017-12-17 LAB — RENAL FUNCTION PANEL
ALBUMIN: 2.1 g/dL — AB (ref 3.5–5.0)
Anion gap: 17 — ABNORMAL HIGH (ref 5–15)
BUN: 61 mg/dL — AB (ref 6–20)
CALCIUM: 8.6 mg/dL — AB (ref 8.9–10.3)
CO2: 24 mmol/L (ref 22–32)
CREATININE: 7.14 mg/dL — AB (ref 0.44–1.00)
Chloride: 97 mmol/L — ABNORMAL LOW (ref 98–111)
GFR calc Af Amer: 7 mL/min — ABNORMAL LOW (ref >60)
GFR, EST NON AFRICAN AMERICAN: 6 mL/min — AB (ref >60)
GLUCOSE: 118 mg/dL — AB (ref 70–99)
PHOSPHORUS: 9 mg/dL — AB (ref 2.5–4.6)
Potassium: 6.1 mmol/L — ABNORMAL HIGH (ref 3.5–5.1)
SODIUM: 138 mmol/L (ref 135–145)

## 2017-12-17 LAB — CBC
HCT: 33 % — ABNORMAL LOW (ref 36.0–46.0)
HEMATOCRIT: 32.2 % — AB (ref 36.0–46.0)
Hemoglobin: 9.6 g/dL — ABNORMAL LOW (ref 12.0–15.0)
Hemoglobin: 10.2 g/dL — ABNORMAL LOW (ref 12.0–15.0)
MCH: 28.5 pg (ref 26.0–34.0)
MCH: 29.1 pg (ref 26.0–34.0)
MCHC: 29.8 g/dL — ABNORMAL LOW (ref 30.0–36.0)
MCHC: 30.9 g/dL (ref 30.0–36.0)
MCV: 95.5 fL (ref 78.0–100.0)
MCV: 94 fL (ref 78.0–100.0)
PLATELETS: 256 10*3/uL (ref 150–400)
PLATELETS: 228 10*3/uL (ref 150–400)
RBC: 3.37 MIL/uL — AB (ref 3.87–5.11)
RBC: 3.51 MIL/uL — AB (ref 3.87–5.11)
RDW: 18.3 % — ABNORMAL HIGH (ref 11.5–15.5)
RDW: 17.5 % — AB (ref 11.5–15.5)
WBC: 11.1 10*3/uL — ABNORMAL HIGH (ref 4.0–10.5)
WBC: 12.5 10*3/uL — AB (ref 4.0–10.5)

## 2017-12-17 LAB — GLUCOSE, CAPILLARY
GLUCOSE-CAPILLARY: 161 mg/dL — AB (ref 70–99)
GLUCOSE-CAPILLARY: 116 mg/dL — AB (ref 70–99)
Glucose-Capillary: 155 mg/dL — ABNORMAL HIGH (ref 70–99)

## 2017-12-17 LAB — BASIC METABOLIC PANEL
ANION GAP: 16 — AB (ref 5–15)
Anion gap: 14 (ref 5–15)
BUN: 57 mg/dL — ABNORMAL HIGH (ref 6–20)
BUN: 61 mg/dL — ABNORMAL HIGH (ref 6–20)
CALCIUM: 8.5 mg/dL — AB (ref 8.9–10.3)
CO2: 28 mmol/L (ref 22–32)
CO2: 21 mmol/L — ABNORMAL LOW (ref 22–32)
Calcium: 8.2 mg/dL — ABNORMAL LOW (ref 8.9–10.3)
Chloride: 95 mmol/L — ABNORMAL LOW (ref 98–111)
Chloride: 99 mmol/L (ref 98–111)
Creatinine, Ser: 6.64 mg/dL — ABNORMAL HIGH (ref 0.44–1.00)
Creatinine, Ser: 7.02 mg/dL — ABNORMAL HIGH (ref 0.44–1.00)
GFR calc Af Amer: 8 mL/min — ABNORMAL LOW (ref >60)
GFR, EST AFRICAN AMERICAN: 8 mL/min — AB (ref >60)
GFR, EST NON AFRICAN AMERICAN: 7 mL/min — AB (ref >60)
GFR, EST NON AFRICAN AMERICAN: 7 mL/min — AB (ref >60)
GLUCOSE: 121 mg/dL — AB (ref 70–99)
Glucose, Bld: 181 mg/dL — ABNORMAL HIGH (ref 70–99)
POTASSIUM: 5.3 mmol/L — AB (ref 3.5–5.1)
POTASSIUM: 5.9 mmol/L — AB (ref 3.5–5.1)
Sodium: 137 mmol/L (ref 135–145)
Sodium: 136 mmol/L (ref 135–145)

## 2017-12-17 LAB — POCT I-STAT 4, (NA,K, GLUC, HGB,HCT)
GLUCOSE: 154 mg/dL — AB (ref 70–99)
GLUCOSE: 140 mg/dL — AB (ref 70–99)
Glucose, Bld: 162 mg/dL — ABNORMAL HIGH (ref 70–99)
HCT: 26 % — ABNORMAL LOW (ref 36.0–46.0)
HCT: 24 % — ABNORMAL LOW (ref 36.0–46.0)
HEMATOCRIT: 28 % — AB (ref 36.0–46.0)
HEMOGLOBIN: 8.8 g/dL — AB (ref 12.0–15.0)
Hemoglobin: 8.2 g/dL — ABNORMAL LOW (ref 12.0–15.0)
Hemoglobin: 9.5 g/dL — ABNORMAL LOW (ref 12.0–15.0)
POTASSIUM: 5.7 mmol/L — AB (ref 3.5–5.1)
POTASSIUM: 6 mmol/L — AB (ref 3.5–5.1)
Potassium: 5.8 mmol/L — ABNORMAL HIGH (ref 3.5–5.1)
SODIUM: 133 mmol/L — AB (ref 135–145)
Sodium: 133 mmol/L — ABNORMAL LOW (ref 135–145)
Sodium: 134 mmol/L — ABNORMAL LOW (ref 135–145)

## 2017-12-17 LAB — SURGICAL PCR SCREEN
MRSA, PCR: NEGATIVE
STAPHYLOCOCCUS AUREUS: NEGATIVE

## 2017-12-17 LAB — POCT ACTIVATED CLOTTING TIME
ACTIVATED CLOTTING TIME: 219 s
ACTIVATED CLOTTING TIME: 202 s
Activated Clotting Time: 191 seconds
Activated Clotting Time: 202 seconds
Activated Clotting Time: 191 seconds

## 2017-12-17 LAB — PREPARE RBC (CROSSMATCH)

## 2017-12-17 LAB — PROTIME-INR
INR: 1.15
PROTHROMBIN TIME: 14.6 s (ref 11.4–15.2)

## 2017-12-17 SURGERY — ENDARTERECTOMY, FEMORAL
Anesthesia: Spinal | Site: Groin | Laterality: Right

## 2017-12-17 MED ORDER — PROPOFOL 1000 MG/100ML IV EMUL
INTRAVENOUS | Status: AC
  Filled 2017-12-17: qty 100

## 2017-12-17 MED ORDER — ACETAMINOPHEN 325 MG PO TABS
325.0000 mg | ORAL_TABLET | ORAL | Status: DC | PRN
  Administered 2017-12-18: 650 mg via ORAL
  Filled 2017-12-17: qty 2

## 2017-12-17 MED ORDER — ALTEPLASE 2 MG IJ SOLR: 2.0000 mg | Freq: Once | INTRAMUSCULAR | Status: DC | PRN

## 2017-12-17 MED ORDER — PHENOL 1.4 % MT LIQD: 1.0000 | OROMUCOSAL | Status: DC | PRN

## 2017-12-17 MED ORDER — SODIUM CHLORIDE 0.9 % IV SOLN
100.0000 mL | INTRAVENOUS | Status: DC | PRN
Start: 2017-12-17 — End: 2017-12-17

## 2017-12-17 MED ORDER — MORPHINE SULFATE (PF) 2 MG/ML IV SOLN
2.0000 mg | INTRAVENOUS | Status: DC | PRN
  Administered 2017-12-17 – 2017-12-18 (×2): 2 mg via INTRAVENOUS
  Filled 2017-12-17 (×2): qty 1

## 2017-12-17 MED ORDER — GUAIFENESIN-DM 100-10 MG/5ML PO SYRP: 15.0000 mL | ORAL_SOLUTION | ORAL | Status: DC | PRN

## 2017-12-17 MED ORDER — FENTANYL CITRATE (PF) 100 MCG/2ML IJ SOLN
INTRAMUSCULAR | Status: DC | PRN
  Administered 2017-12-17: 50 ug via INTRAVENOUS
  Administered 2017-12-17: 25 ug via INTRAVENOUS
  Administered 2017-12-17: 50 ug via INTRAVENOUS
  Administered 2017-12-17 (×2): 25 ug via INTRAVENOUS

## 2017-12-17 MED ORDER — HEPARIN SODIUM (PORCINE) 1000 UNIT/ML IJ SOLN
INTRAMUSCULAR | Status: DC | PRN
  Administered 2017-12-17 (×2): 3000 [IU] via INTRAVENOUS
  Administered 2017-12-17: 9000 [IU] via INTRAVENOUS
  Administered 2017-12-17: 3000 [IU] via INTRAVENOUS
  Administered 2017-12-17: 2000 [IU] via INTRAVENOUS
  Administered 2017-12-17: 1000 [IU] via INTRAVENOUS

## 2017-12-17 MED ORDER — 0.9 % SODIUM CHLORIDE (POUR BTL) OPTIME
TOPICAL | Status: DC | PRN
  Administered 2017-12-17: 1000 mL
  Administered 2017-12-17: 2000 mL

## 2017-12-17 MED ORDER — HYDROMORPHONE HCL 1 MG/ML IJ SOLN
INTRAMUSCULAR | Status: AC
  Filled 2017-12-17: qty 1

## 2017-12-17 MED ORDER — ONDANSETRON HCL 4 MG/2ML IJ SOLN: 4.0000 mg | Freq: Once | INTRAMUSCULAR | Status: DC | PRN

## 2017-12-17 MED ORDER — SODIUM CHLORIDE 0.9 % IV SOLN: 100.0000 mL | INTRAVENOUS | Status: DC | PRN

## 2017-12-17 MED ORDER — MEPERIDINE HCL 50 MG/ML IJ SOLN: 6.2500 mg | INTRAMUSCULAR | Status: DC | PRN

## 2017-12-17 MED ORDER — FENTANYL CITRATE (PF) 250 MCG/5ML IJ SOLN
INTRAMUSCULAR | Status: AC
  Filled 2017-12-17: qty 5

## 2017-12-17 MED ORDER — SODIUM CHLORIDE 0.9% IV SOLUTION: Freq: Once | INTRAVENOUS | Status: DC

## 2017-12-17 MED ORDER — LIDOCAINE-PRILOCAINE 2.5-2.5 % EX CREA
1.0000 "application " | TOPICAL_CREAM | CUTANEOUS | Status: DC | PRN
  Filled 2017-12-17: qty 5

## 2017-12-17 MED ORDER — SODIUM CHLORIDE 0.9 % IV SOLN: 500.0000 mL | Freq: Once | INTRAVENOUS | Status: DC | PRN

## 2017-12-17 MED ORDER — SODIUM CHLORIDE 0.9 % IV SOLN
INTRAVENOUS | Status: AC
  Filled 2017-12-17: qty 1.2

## 2017-12-17 MED ORDER — HEPARIN SODIUM (PORCINE) 1000 UNIT/ML IJ SOLN
INTRAMUSCULAR | Status: AC
  Filled 2017-12-17: qty 1

## 2017-12-17 MED ORDER — SODIUM CHLORIDE 0.9 % IV SOLN
INTRAVENOUS | Status: DC | PRN
  Administered 2017-12-17 (×2)

## 2017-12-17 MED ORDER — MIDAZOLAM HCL 2 MG/2ML IJ SOLN
INTRAMUSCULAR | Status: AC
  Filled 2017-12-17: qty 2

## 2017-12-17 MED ORDER — LIDOCAINE HCL (PF) 1 % IJ SOLN
INTRAMUSCULAR | Status: AC
  Filled 2017-12-17: qty 30

## 2017-12-17 MED ORDER — ALUM & MAG HYDROXIDE-SIMETH 200-200-20 MG/5ML PO SUSP: 15.0000 mL | ORAL | Status: DC | PRN

## 2017-12-17 MED ORDER — LABETALOL HCL 5 MG/ML IV SOLN
INTRAVENOUS | Status: AC
  Filled 2017-12-17: qty 4

## 2017-12-17 MED ORDER — BUPIVACAINE HCL (PF) 0.5 % IJ SOLN
INTRAMUSCULAR | Status: DC | PRN
  Administered 2017-12-17: 3 mL via INTRATHECAL

## 2017-12-17 MED ORDER — PROPOFOL 500 MG/50ML IV EMUL
INTRAVENOUS | Status: DC | PRN
  Administered 2017-12-17: 25 ug/kg/min via INTRAVENOUS

## 2017-12-17 MED ORDER — DOCUSATE SODIUM 100 MG PO CAPS
100.0000 mg | ORAL_CAPSULE | Freq: Every day | ORAL | Status: DC
  Administered 2017-12-19 – 2017-12-20 (×2): 100 mg via ORAL
  Filled 2017-12-17 (×4): qty 1

## 2017-12-17 MED ORDER — MIDAZOLAM HCL 2 MG/2ML IJ SOLN
INTRAMUSCULAR | Status: DC | PRN
  Administered 2017-12-17 (×2): 1 mg via INTRAVENOUS

## 2017-12-17 MED ORDER — PROTAMINE SULFATE 10 MG/ML IV SOLN
INTRAVENOUS | Status: DC | PRN
  Administered 2017-12-17: 25 mg via INTRAVENOUS
  Administered 2017-12-17: 50 mg via INTRAVENOUS

## 2017-12-17 MED ORDER — PROPOFOL 10 MG/ML IV BOLUS
INTRAVENOUS | Status: DC | PRN
  Administered 2017-12-17: 20 mg via INTRAVENOUS

## 2017-12-17 MED ORDER — LIDOCAINE HCL (PF) 1 % IJ SOLN: 5.0000 mL | INTRAMUSCULAR | Status: DC | PRN

## 2017-12-17 MED ORDER — METOPROLOL TARTRATE 5 MG/5ML IV SOLN: 2.0000 mg | INTRAVENOUS | Status: DC | PRN

## 2017-12-17 MED ORDER — PENTAFLUOROPROP-TETRAFLUOROETH EX AERO: 1.0000 "application " | INHALATION_SPRAY | CUTANEOUS | Status: DC | PRN

## 2017-12-17 MED ORDER — HEPARIN SODIUM (PORCINE) 1000 UNIT/ML DIALYSIS: 1000.0000 [IU] | INTRAMUSCULAR | Status: DC | PRN

## 2017-12-17 MED ORDER — LABETALOL HCL 5 MG/ML IV SOLN: 10.0000 mg | INTRAVENOUS | Status: DC | PRN

## 2017-12-17 MED ORDER — VANCOMYCIN HCL 10 G IV SOLR
1500.0000 mg | INTRAVENOUS | Status: AC
  Administered 2017-12-17: 1500 mg via INTRAVENOUS
  Filled 2017-12-17: qty 1500

## 2017-12-17 MED ORDER — SODIUM CHLORIDE 0.9 % IJ SOLN
INTRAVENOUS | Status: DC | PRN
  Administered 2017-12-17: 13:00:00 via INTRAMUSCULAR

## 2017-12-17 MED ORDER — ACETAMINOPHEN 325 MG RE SUPP: 325.0000 mg | RECTAL | Status: DC | PRN

## 2017-12-17 MED ORDER — HYDROMORPHONE HCL 1 MG/ML IJ SOLN
0.2500 mg | INTRAMUSCULAR | Status: DC | PRN
  Administered 2017-12-17 (×3): 0.5 mg via INTRAVENOUS

## 2017-12-17 MED ORDER — LIDOCAINE-PRILOCAINE 2.5-2.5 % EX CREA: 1.0000 "application " | TOPICAL_CREAM | CUTANEOUS | Status: DC | PRN

## 2017-12-17 MED ORDER — HEMOSTATIC AGENTS (NO CHARGE) OPTIME
TOPICAL | Status: DC | PRN
  Administered 2017-12-17: 1 via TOPICAL

## 2017-12-17 MED ORDER — SODIUM CHLORIDE 0.9 % IV SOLN
INTRAVENOUS | Status: DC
  Administered 2017-12-17: 11:00:00 via INTRAVENOUS

## 2017-12-17 MED ORDER — HEPARIN SODIUM (PORCINE) 1000 UNIT/ML DIALYSIS
1000.0000 [IU] | INTRAMUSCULAR | Status: DC | PRN
  Filled 2017-12-17: qty 1

## 2017-12-17 MED ORDER — PANTOPRAZOLE SODIUM 40 MG PO TBEC
40.0000 mg | DELAYED_RELEASE_TABLET | Freq: Every day | ORAL | Status: DC
  Administered 2017-12-17 – 2017-12-20 (×4): 40 mg via ORAL
  Filled 2017-12-17 (×5): qty 1

## 2017-12-17 SURGICAL SUPPLY — 112 items
ADH SKN CLS APL DERMABOND .7 (GAUZE/BANDAGES/DRESSINGS) ×4
BAG SNAP BAND KOVER 36X36 (MISCELLANEOUS) ×2 IMPLANT
BALLN MUSTANG 5.0X20 75 (BALLOONS) ×5
BALLN MUSTANG 5.0X20 75CM (BALLOONS) ×1
BALLN MUSTANG 5.0X40 75 (BALLOONS) ×5
BALLN MUSTANG 5.0X40 75CM (BALLOONS) ×1
BALLN MUSTANG 7.0X20 75 (BALLOONS) ×5
BALLN MUSTANG 7.0X20 75CM (BALLOONS) ×1
BALLN STERLING RX 5X20X80 (BALLOONS) ×6
BALLOON MUSTANG 5.0X20 75 (BALLOONS) IMPLANT
BALLOON MUSTANG 5.0X40 75 (BALLOONS) IMPLANT
BALLOON MUSTANG 7.0X20 75 (BALLOONS) IMPLANT
BALLOON STERLING RX 5X20X80 (BALLOONS) IMPLANT
BANDAGE ACE 4X5 VEL STRL LF (GAUZE/BANDAGES/DRESSINGS) ×6 IMPLANT
BANDAGE ELASTIC 4 VELCRO ST LF (GAUZE/BANDAGES/DRESSINGS) ×4 IMPLANT
BLADE 10 SAFETY STRL DISP (BLADE) ×4 IMPLANT
BLADE AVERAGE 25MMX9MM (BLADE) ×2
BLADE AVERAGE 25X9 (BLADE) ×6 IMPLANT
BLADE SAW SGTL 81X20 HD (BLADE) IMPLANT
BNDG GAUZE ELAST 4 BULKY (GAUZE/BANDAGES/DRESSINGS) ×8 IMPLANT
CANISTER PREVENA PLUS 150 (CANNISTER) ×2 IMPLANT
CANISTER SUCT 3000ML PPV (MISCELLANEOUS) ×6 IMPLANT
CATH EMB 4FR 40CM (CATHETERS) ×2 IMPLANT
CATH EMB 5FR 80CM (CATHETERS) ×2 IMPLANT
CATH QUICKCROSS SUPP .035X90CM (MICROCATHETER) ×2 IMPLANT
CLIP VESOCCLUDE MED 24/CT (CLIP) ×8 IMPLANT
CLIP VESOCCLUDE MED 6/CT (CLIP) ×6 IMPLANT
CLIP VESOCCLUDE SM WIDE 24/CT (CLIP) ×6 IMPLANT
CLIP VESOCCLUDE SM WIDE 6/CT (CLIP) ×6 IMPLANT
COVER BACK TABLE 80X110 HD (DRAPES) ×4 IMPLANT
COVER PROBE W GEL 5X96 (DRAPES) ×2 IMPLANT
COVER SURGICAL LIGHT HANDLE (MISCELLANEOUS) ×10 IMPLANT
DERMABOND ADVANCED (GAUZE/BANDAGES/DRESSINGS) ×2
DERMABOND ADVANCED .7 DNX12 (GAUZE/BANDAGES/DRESSINGS) ×4 IMPLANT
DRAIN CHANNEL 15F RND FF W/TCR (WOUND CARE) IMPLANT
DRAPE C-ARM 42X72 X-RAY (DRAPES) ×6 IMPLANT
DRAPE EXTREMITY T 121X128X90 (DRAPE) ×6 IMPLANT
DRAPE HALF SHEET 40X57 (DRAPES) ×6 IMPLANT
DRAPE X-RAY CASS 24X20 (DRAPES) IMPLANT
DRESSING PREVENA PLUS CUSTOM (GAUZE/BANDAGES/DRESSINGS) IMPLANT
DRSG PAD ABDOMINAL 8X10 ST (GAUZE/BANDAGES/DRESSINGS) ×2 IMPLANT
DRSG PREVENA PLUS CUSTOM (GAUZE/BANDAGES/DRESSINGS) ×6
ELECT REM PT RETURN 9FT ADLT (ELECTROSURGICAL) ×6
ELECTRODE REM PT RTRN 9FT ADLT (ELECTROSURGICAL) ×4 IMPLANT
EVACUATOR SILICONE 100CC (DRAIN) IMPLANT
GAUZE SPONGE 4X4 12PLY STRL (GAUZE/BANDAGES/DRESSINGS) ×8 IMPLANT
GLOVE BIO SURGEON STRL SZ7 (GLOVE) ×4 IMPLANT
GLOVE BIO SURGEON STRL SZ7.5 (GLOVE) ×2 IMPLANT
GLOVE BIOGEL PI IND STRL 6.5 (GLOVE) IMPLANT
GLOVE BIOGEL PI IND STRL 7.0 (GLOVE) IMPLANT
GLOVE BIOGEL PI IND STRL 7.5 (GLOVE) ×4 IMPLANT
GLOVE BIOGEL PI IND STRL 8 (GLOVE) IMPLANT
GLOVE BIOGEL PI INDICATOR 6.5 (GLOVE) ×6
GLOVE BIOGEL PI INDICATOR 7.0 (GLOVE) ×4
GLOVE BIOGEL PI INDICATOR 7.5 (GLOVE) ×10
GLOVE BIOGEL PI INDICATOR 8 (GLOVE) ×2
GLOVE ECLIPSE 7.0 STRL STRAW (GLOVE) ×2 IMPLANT
GLOVE SURG SS PI 7.5 STRL IVOR (GLOVE) ×6 IMPLANT
GOWN STRL REUS W/ TWL LRG LVL3 (GOWN DISPOSABLE) ×8 IMPLANT
GOWN STRL REUS W/ TWL XL LVL3 (GOWN DISPOSABLE) ×4 IMPLANT
GOWN STRL REUS W/TWL LRG LVL3 (GOWN DISPOSABLE) ×24
GOWN STRL REUS W/TWL XL LVL3 (GOWN DISPOSABLE) ×12
HEMOSTAT SNOW SURGICEL 2X4 (HEMOSTASIS) ×2 IMPLANT
KIT BASIN OR (CUSTOM PROCEDURE TRAY) ×6 IMPLANT
KIT ENCORE 26 ADVANTAGE (KITS) ×2 IMPLANT
KIT PREVENA INCISION MGT 13 (CANNISTER) ×2 IMPLANT
KIT TURNOVER KIT B (KITS) ×6 IMPLANT
NDL HYPO 25GX1X1/2 BEV (NEEDLE) IMPLANT
NDL PERC 18GX7CM (NEEDLE) IMPLANT
NEEDLE HYPO 25GX1X1/2 BEV (NEEDLE) IMPLANT
NEEDLE PERC 18GX7CM (NEEDLE) ×6 IMPLANT
NS IRRIG 1000ML POUR BTL (IV SOLUTION) ×12 IMPLANT
PACK GENERAL/GYN (CUSTOM PROCEDURE TRAY) ×6 IMPLANT
PACK PERIPHERAL VASCULAR (CUSTOM PROCEDURE TRAY) ×6 IMPLANT
PAD ARMBOARD 7.5X6 YLW CONV (MISCELLANEOUS) ×12 IMPLANT
PATCH VASC XENOSURE 1CMX6CM (Vascular Products) ×12 IMPLANT
PATCH VASC XENOSURE 1X6 (Vascular Products) IMPLANT
SET COLLECT BLD 21X3/4 12 (NEEDLE) IMPLANT
SHEATH FLEX ANSEL ANG 6F 45CM (SHEATH) ×2 IMPLANT
SHEATH PINNACLE 6F 10CM (SHEATH) ×2 IMPLANT
SPONGE LAP 18X18 X RAY DECT (DISPOSABLE) ×6 IMPLANT
STENT ELUVIA 6X40X130 (Permanent Stent) ×2 IMPLANT
STENT ELUVIA 6X60X130 (Permanent Stent) ×2 IMPLANT
STENT INNOVA 8X20X130 (Permanent Stent) ×2 IMPLANT
STOPCOCK 4 WAY LG BORE MALE ST (IV SETS) ×10 IMPLANT
STOPCOCK MORSE 400PSI 3WAY (MISCELLANEOUS) ×6 IMPLANT
SUT ETHILON 3 0 PS 1 (SUTURE) ×8 IMPLANT
SUT PROLENE 5 0 C 1 24 (SUTURE) ×12 IMPLANT
SUT PROLENE 5 0 C 1 36 (SUTURE) ×2 IMPLANT
SUT PROLENE 6 0 BV (SUTURE) ×8 IMPLANT
SUT PROLENE 6 0 CC (SUTURE) IMPLANT
SUT PROLENE 7 0 BV1 MDA (SUTURE) ×2 IMPLANT
SUT VIC AB 2-0 CT1 27 (SUTURE) ×6
SUT VIC AB 2-0 CT1 TAPERPNT 27 (SUTURE) ×4 IMPLANT
SUT VIC AB 2-0 CTX 36 (SUTURE) IMPLANT
SUT VIC AB 3-0 SH 27 (SUTURE) ×6
SUT VIC AB 3-0 SH 27X BRD (SUTURE) ×4 IMPLANT
SUT VICRYL 4-0 PS2 18IN ABS (SUTURE) ×6 IMPLANT
SYR 10ML LL (SYRINGE) ×2 IMPLANT
SYR 3ML LL SCALE MARK (SYRINGE) ×4 IMPLANT
SYR CONTROL 10ML LL (SYRINGE) IMPLANT
SYR MEDRAD MARK V 150ML (SYRINGE) IMPLANT
SYRINGE 20CC LL (MISCELLANEOUS) ×4 IMPLANT
TOWEL GREEN STERILE (TOWEL DISPOSABLE) ×12 IMPLANT
TRAY FOLEY MTR SLVR 16FR STAT (SET/KITS/TRAYS/PACK) ×6 IMPLANT
TUBING EXTENTION W/L.L. (IV SETS) ×6 IMPLANT
TUBING HIGH PRESSURE 120CM (CONNECTOR) IMPLANT
UNDERPAD 30X30 (UNDERPADS AND DIAPERS) ×6 IMPLANT
WATER STERILE IRR 1000ML POUR (IV SOLUTION) ×6 IMPLANT
WIRE BENTSON .035X145CM (WIRE) ×2 IMPLANT
WIRE G V18X300CM (WIRE) ×2 IMPLANT
WIRE HI TORQ VERSACORE J 260CM (WIRE) ×2 IMPLANT

## 2017-12-17 NOTE — Anesthesia Preprocedure Evaluation (Addendum)
Anesthesia Evaluation  Patient identified by MRN, date of birth, ID band Patient awake    Reviewed: Allergy & Precautions, NPO status , Patient's Chart, lab work & pertinent test results  Airway Mallampati: I  TM Distance: >3 FB Neck ROM: Full    Dental   Pulmonary former smoker,    Pulmonary exam normal        Cardiovascular hypertension, + CAD  Normal cardiovascular exam     Neuro/Psych    GI/Hepatic   Endo/Other  diabetes, Type 2, Oral Hypoglycemic Agents  Renal/GU ESRF and DialysisRenal disease     Musculoskeletal   Abdominal   Peds  Hematology   Anesthesia Other Findings   Reproductive/Obstetrics                             Anesthesia Physical Anesthesia Plan  ASA: III  Anesthesia Plan: Spinal   Post-op Pain Management:    Induction: Intravenous  PONV Risk Score and Plan: 3 and Treatment may vary due to age or medical condition  Airway Management Planned: Oral ETT  Additional Equipment: Arterial line  Intra-op Plan:   Post-operative Plan:   Informed Consent: I have reviewed the patients History and Physical, chart, labs and discussed the procedure including the risks, benefits and alternatives for the proposed anesthesia with the patient or authorized representative who has indicated his/her understanding and acceptance.     Plan Discussed with: CRNA and Surgeon  Anesthesia Plan Comments:        Anesthesia Quick Evaluation

## 2017-12-17 NOTE — Anesthesia Procedure Notes (Signed)
Procedure Name: MAC Date/Time: 12/22/2017 12:00 PM Performed by: Leonor Liv, CRNA Pre-anesthesia Checklist: Patient identified, Emergency Drugs available, Suction available, Patient being monitored and Timeout performed Oxygen Delivery Method: Simple face mask Placement Confirmation: positive ETCO2

## 2017-12-17 NOTE — Transfer of Care (Signed)
Immediate Anesthesia Transfer of Care Note  Patient: [name]  Procedure(s) Performed: LEFT SUPERFICIAL FEMORAL ARTERY ENDARTERECTOMY (Left Groin) INSERTION OF LEFT FEMORAL ELUVIA STENT (Left ) AMPUTATION TOE (Left Foot) PATCH ANGIOPLASTY USING XENOSURE PATCH (Left Groin) IRRIGATION AND DEBRIDEMENT OF RIGHT BELOW KNEE AMPUTATION (Right )  Patient Location: PACU  Anesthesia Type:MAC combined with regional for post-op pain  Level of Consciousness: awake, alert , oriented and patient cooperative  Airway & Oxygen Therapy: Patient Spontanous Breathing and Patient connected to face mask oxygen  Post-op Assessment: Report given to RN and Post -op Vital signs reviewed and stable  Post vital signs: Reviewed and stable  Last Vitals:  Vitals Value Taken Time  BP    Temp    Pulse 68 12/11/2017  5:15 PM  Resp 13 12/23/2017  5:18 PM  SpO2 97 % 12/01/2017  5:15 PM  Vitals shown include unvalidated device data.  Last Pain:  Vitals:   12/12/2017 0957  TempSrc:   PainSc: 7       Patients Stated Pain Goal: 7 (50/35/46 5681)  Complications: No apparent anesthesia complications

## 2017-12-17 NOTE — Anesthesia Procedure Notes (Signed)
Spinal  Start time: 11/29/2017 12:15 PM End time: 12/18/2017 12:18 PM Staffing Anesthesiologist: Lillia Abed, MD Performed: anesthesiologist  Preanesthetic Checklist Completed: patient identified, surgical consent, pre-op evaluation, timeout performed, IV checked, risks and benefits discussed and monitors and equipment checked Spinal Block Patient position: sitting Prep: DuraPrep Patient monitoring: continuous pulse ox, blood pressure, cardiac monitor and heart rate Approach: right paramedian Location: L3-4 Injection technique: single-shot Needle Needle type: Pencan  Needle gauge: 24 G Needle length: 9 cm Needle insertion depth: 9 cm

## 2017-12-17 NOTE — Op Note (Signed)
Patient name: [name] MRN: [id number] DOB: [birth year] Sex: female  12/04/2017 Pre-operative Diagnosis: #1: Gangrenous left second and third toes     #2: Infected right below-knee amputation stump Post-operative diagnosis:  Same Surgeon:  [name] Assistants: Laurence Slate and [facility] Procedure:   #1: Endarterectomy of the left distal external iliac, common femoral, profundofemoral, and superficial femoral artery with bovine pericardial patch angioplasty   #2: Stent, left external iliac artery   #3: Abdominal and iliac artery angiogram   #4: Left lower extremity angiogram   #5: Stent, left superficial femoral artery   #6: Amputation including metatarsal head left second and third toes   #7: Incision and drainage including skin and subcutaneous tissue, right below-knee amputation   #8: Placement of Praveena wound VAC to the left femoral incision Anesthesia: Spinal Blood Loss: 500 cc Specimens: Cultures of the below-knee stump were taken and sent to microbiology  Findings: Purulent drainage from amputation stump on the right.  Poor bleeding at the toe amputation site on the left.  Heavily calcified common femoral superficial femoral profundofemoral and external iliac artery  Indications: The patient has previously undergone right leg below-knee amputation.  She has recently developed foul drainage.  She also had acute onset of gangrenous changes to her second and third toe on the left.  She  underwent angiography yesterday which revealed a high-grade left common femoral artery stenosis as well as moderate stenosis within the superficial femoral artery.  She comes in today for revascularization and toe amputation as well as debridement of a below-knee amputation stump.  Procedure:  The patient was identified in the holding area and taken to [place] [facility]  The patient was then placed supine on the table. spinal anesthesia was administered.  The patient was prepped and draped  in the usual sterile fashion.  A time out was called and antibiotics were administered.  A longitudinal incision was made in the left groin.  Cautery was used about subtenons tissue down the femoral sheath.  There was significant scar tissue in the left groin.  I dissected out the common femoral superficial femoral and profundofemoral artery.  These were extensively calcified up under the inguinal ligament.  Once a had adequate exposure, the patient was fully heparinized.  Heparin levels were frequently checked throughout the case with a CTs, appropriately dosing was performed.  Vascular clamps were used to occlude the common femoral superficial femoral and profundofemoral artery.  Using a #11 blade to make an arteriotomy.  There was persistent bleeding coming from the iliac artery.  I inserted a #5 Fogarty catheter proximally for proximal control.  I then performed a endarterectomy of the common femoral superficial femoral and profundofemoral artery.  I opened the superficial femoral artery approximately 2 cm until I got a good endpoint.  I tacked down the intima with 7-0 Prolene sutures.  Eversion endarterectomy of the profundofemoral artery was performed.  There is a good distal endpoint.  I then used a hemostat and elevator to remove plaque up under the inguinal ligament.  I then perform patch angioplasty using a bovine pericardial patch.  Because of the length of the arteriotomy, 2 patches were required.  The total length of the patch angioplasty was approximately 8 cm.  Once this was completed, the appropriate flushing maneuvers were performed.  The endarterectomy was irrigated to remove all potential debris and thrombus.  The endarterectomy was then completed.  At this point there was a pulse within the common  femoral artery, however I did not feel that it was as prominent as it should be.  I then cannulated the patch with an 18-gauge needle and advanced a wire into the aorta.  A 6 French sheath was placed.   A retrograde injection was performed identifying the distal abdominal aorta left common iliac and external iliac artery.  I felt that there was a hemodynamically significant lesion of approximately 70%, where the endarterectomy was terminated.  I felt that this needed to be stented, and therefore a 8 x 20 INNOVA stent was deployed in the distal external iliac artery landing into the proximal portion of the patch.  A 7 mm balloon was used to expand the stent.  At this point, I felt like there was a significant improvement in the pulse within the left femoral artery.  Angiography was repeated and the stenosis appeared to have completely resolved.  I then removed the sheath and through the hole in the patch, I inserted a versa core wire into the superficial femoral artery.  It was advanced down into the popliteal artery and left leg runoff was performed.  This showed tandem lesions in the distal superficial femoral artery, both of which were approximately 60%.  I elected to primarily stent this.  I inserted a 6 x 60 drug-eluting Elluvia stent, however I could not get it to go past a calcified lesion in the proximal superficial femoral artery. I therefore removed the stent and perform angioplasty of this area with a 5 mm balloon.  With some difficulty, I was able to advance the stent into the appropriate location and it was successfully deployed and then dilated with a 5 mm balloon.  I then felt that the area that caused difficulty getting the stent into position needed to be treated.  This was more proximal to the stent.  I selected a 6 x 40 drug-eluting Elluvia stent across this area.  I then elected to mold this with a 5 mm balloon however I could not get the 5 mm balloon to go through the lesion.  I then placed a quick cross catheter across the lesion and switched out to a V-18 wire.  I then used a 018 5 x 20 Sterling balloon to mold the stent.  Completion angiography was then performed which showed a widely patent  superficial femoral artery and popliteal artery.  Tibial vessels were unchanged.  Runoff onto the foot was severely limited distal the pedal arch.  I then remove the sheath and closed the hole in the patch with a 5-0 Prolene.  50 mg of protamine was then used to reverse the heparin.  I packed the left groin and turned my attention towards the below-knee amputation stump on the right.  The incision was opened and there was purulent drainage.  I debrided the skin and subcutaneous tissue with fat.  There was minimal bleeding.  I then perform amputation of the gangrenous left second and third toes.  Metatarsal head was resected with rongeurs.  There was minimal capillary bleeding.  At this point I felt that the patient was most likely going to need a more proximal amputation on the left as well as the right, however her spinal anesthesia was wearing off and I did not feel it was safe to proceed with any other interventions at this time.  Therefore sterile dressings were placed on the toe amputation site as well as the debridement of the below-knee amputation stump on the right  Attention was then turned  towards the groin.  It was hemostatic.  There was a fair amount of lymphatic drainage.  I reapproximated the femoral sheath with 2-0 Vicryl.  The subcutaneous tissue was then closed with additional layers of 2-0 and 3-0 Vicryl followed by 3-0 Vicryl on the skin.  A Praveena wound VAC was placed.  The patient was successfully awakened from anesthesia and taken to recovery room in stable condition.  There were no immediate complications.   Disposition: To PACU stable   V. [name], M.D. [facility] [place] [place] Office: [phone] Pager:  [phone]

## 2017-12-17 NOTE — Anesthesia Procedure Notes (Addendum)
Arterial Line Insertion Start/End07/19/2019 12:03 PM, 12/17/2017 12:09 PM Performed by: Lillia Abed, MD, anesthesiologist  Patient location: OR. Preanesthetic checklist: patient identified, IV checked, risks and benefits discussed, surgical consent, monitors and equipment checked, pre-op evaluation, timeout performed and anesthesia consent Patient sedated Right, brachial was placed Catheter size: 20 G Hand hygiene performed  and maximum sterile barriers used   Attempts: 1 (2 attempts to R radial in preop by SRNA and CRNA) Procedure performed without using ultrasound guided technique. Following insertion, dressing applied and Biopatch. Post procedure assessment: normal  Patient tolerated the procedure well with no immediate complications.

## 2017-12-17 NOTE — Consult Note (Addendum)
[facility] Renal Consultation Note    Indication for Consultation:  Management of ESRD/hemodialysis; anemia, hypertension/volume and secondary hyperparathyroidism  GYI:[id number], Ajith, MD  HPI: [name] is a 42 y.o. female. ESRD 2/2 DM on HD MWF at [facility], first starting in 07/2010.  Past medical history significant for DM with retinopathy, CAD h/o NSTEMI, G3DD, restrictive CM w/EF 50-55%, pulm HTN,  h/o sacral decubitus ulcer, h/o ascites w/recurrent paracentesis last 12/04/17 by [facility] IR; PVD s/p CFA, profunda, SFA and EIA endarterectomy on 04/16/17 by Dr. 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 Trula Slade.   Patient was seen by [facility] in office for wound check where they found her R stump was no longer healing and looked concerning for infection as well as LLE PVD with gangrene.    Patient seen and examined at bedside with husband present.  States she is feeling ok.  Reports no problems with dialysis, tolerating well, and has been compliant with all of her treatments.  Denies edema, SOB, abdominal pain, chest pain, n/v/d, and headache.  Dr. Russella Dar plans to take her to the OR today for revascularization, digit amputations and debridement of R stump.  Patient to be admitted for further evaluation and management.    Past Medical History:  Diagnosis Date  . Arthritis   . CHF (congestive heart failure) ([place])   . Chronic anemia    2nd to renal disease  . CIN III (cervical intraepithelial neoplasia grade III) with severe dysplasia    S/P LEEP AND CONE  . Coronary artery disease    Status post cardiac catheterization June 2012 scattered coronary artery disease/atherosclerosis with 70-80% stenosis in a small right PDA.  . Diabetic Charcot's joint disease ([place])   . DM (diabetes mellitus) ([place])    Long-term insulin  . End stage renal disease on dialysis [facility]) 05/02/11   "[facility]"; [facility]; M; W, F ("got it 04/15/2017 because of Thanksgiving")  . Fracture of 5th  metatarsal 2016   Right  . Gastroparesis   . Heart murmur   . 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 History of abscesses in groins 12/06/2010  . Hyperlipidemia    Hypertriglyceridemia 449 HDL 25  . Hypertension   . Migraines    "just on dialysis days"  . Peripheral neuropathy    related to DM  . Peripheral vascular disease ([place])    Tibial occlusive disease evaluated by Dr. Kellie Simmering in August 2011. Medical therapy  . Renal insufficiency    Dialysis since 2012  . Seizures ([place])    "only when her sugar drops" (04/17/2017)  . Tobacco use disorder    Discontinued March 2012  . Trimalleolar fracture of ankle, closed 02/09/2015   Right   Past Surgical History:  Procedure Laterality Date  . ABDOMINAL AORTOGRAM W/LOWER EXTREMITY [name] 03/27/2017   Procedure: ABDOMINAL AORTOGRAM W/LOWER EXTREMITY;  Surgeon: Conrad [place] Gardens, MD;  Location: [place] [facility];  Service: Cardiovascular;  Laterality: [name];  . ABDOMINAL AORTOGRAM W/LOWER EXTREMITY [name] 11/30/2017   Procedure: ABDOMINAL AORTOGRAM W/LOWER EXTREMITY;  Surgeon: Serafina Mitchell, MD;  Location: [place] [facility];  Service: Cardiovascular;  Laterality: [name];  Bilateral  . AMPUTATION Right 05/09/2017   Procedure: AMPUTATION TRANSMETATARSAL OF RIGHT FOOT;  Surgeon: Serafina Mitchell, MD;  Location: [place];  Service: Vascular;  Laterality: Right;  . AMPUTATION Right 06/17/2017   Procedure: AMPUTATION BELOW KNEE RIGHT;  Surgeon: Serafina Mitchell, MD;  Location: [place];  Service: Vascular;  Laterality: Right;  .  AV FISTULA PLACEMENT Left 04/2010   "lower arm"  . CARDIAC CATHETERIZATION [name] 02/10/2015   Procedure: Left Heart Cath and Coronary Angiography;  Surgeon: Dixie Dials, MD;  Location: [place] [facility];  Service: Cardiovascular;  Laterality: [name];  . CARDIAC CATHETERIZATION [name] 02/21/2016   Procedure: Right Heart Cath;  Surgeon: Jolaine Artist, MD;  Location: [place] [facility];  Service: Cardiovascular;  Laterality: [name];  . CATARACT EXTRACTION W/  INTRAOCULAR LENS  IMPLANT, BILATERAL Bilateral   . CERVICAL BIOPSY  W/ LOOP ELECTRODE EXCISION     h/o  . CERVICAL CONE BIOPSY     h/o  . COLPOSCOPY    . DILATION AND CURETTAGE OF UTERUS  2009  . ENDARTERECTOMY FEMORAL Right 04/16/2017   Procedure: RIGHT FEMORAL ENDARTERECTOMY;  Surgeon: Serafina Mitchell, MD;  Location: [place];  Service: Vascular;  Laterality: Right;  . FRACTURE SURGERY    . HERNIA REPAIR    . INCISION AND DRAINAGE ABSCESS     "groin"  . INSERTION OF DIALYSIS CATHETER Left 04/16/2017   Procedure: INSERTION of temporay DIALYSIS CATHETER, left femoral artery;  Surgeon: Serafina Mitchell, MD;  Location: [place];  Service: Vascular;  Laterality: Left;  . IR HYBRID TRAUMA EMBOLIZATION  04/16/2017  . IR PARACENTESIS  06/12/2017  . IR PARACENTESIS  06/24/2017  . IR PARACENTESIS  07/01/2017  . IR PARACENTESIS  07/10/2017  . IR PARACENTESIS  08/29/2017  . IR PARACENTESIS  09/18/2017  . IR PARACENTESIS  09/24/2017  . IR PARACENTESIS  10/10/2017  . IR PARACENTESIS  11/06/2017  . IR PARACENTESIS  12/04/2017  . ORIF ANKLE FRACTURE Right 02/16/2015   Procedure: OPEN REDUCTION INTERNAL FIXATION (ORIF) RIGHT ANKLE FRACTURE;  Surgeon: Altamese Gilbert Creek, MD;  Location: [place];  Service: Orthopedics;  Laterality: Right;  . PATCH ANGIOPLASTY Right 04/16/2017   Procedure: PATCH ANGIOPLASTY OF RIGHT FEMORAL ARTERY USING BOVINE PERICARDIUM PATCH;  Surgeon: Serafina Mitchell, MD;  Location: [facility];  Service: Vascular;  Laterality: Right;  . UMBILICAL HERNIA REPAIR  X 2  . WOUND EXPLORATION Right 05/09/2017   Procedure: EXPLORATION RIGHT GROIN;  Surgeon: Serafina Mitchell, MD;  Location: [facility];  Service: Vascular;  Laterality: Right;   Family History  Problem Relation Age of Onset  . Diabetes Mother   . Hypertension Mother   . Diabetes Father   . Hyperlipidemia Father   . Hypertension Father   . Diabetes Sister   . Stroke Maternal Grandmother   . Diabetes Sister   . Breast cancer Maternal Aunt        Age  3's   Social History:  reports that she quit smoking about 6 years ago. Her smoking use included cigarettes. She has a 3.00 pack-year smoking history. She has never used smokeless tobacco. She reports that she does not drink alcohol or use drugs. Allergies  Allergen Reactions  . Cephalexin Other (See Comments)    Reaction unknown- Childhood allergy Tolerated Ceftriaxone, ceftazidime, zosyn, augmentin in the past  . Sulfamethoxazole-Trimethoprim Other (See Comments)    Unknown reaction. Pt states that she was told by her mother that she had allergy to Bactrim as a child.   Prior to Admission medications   Medication Sig Start Date End Date Taking? Authorizing Provider  acetaminophen (TYLENOL) 500 MG tablet Take 1,000-1,500 mg by mouth every 6 (six) hours as needed for moderate pain or headache.   Yes [provider]  amLODipine (NORVASC) 10 MG tablet Take 1 tablet (10 mg  total) by mouth daily after supper. Patient taking differently: Take 10 mg by mouth 2 (two) times daily.  07/10/17  Yes Love, Ivan Anchors, PA-C  aspirin EC 81 MG tablet Take 81 mg by mouth daily.   Yes [provider]  atorvastatin (LIPITOR) 40 MG tablet Take 1 tablet (40 mg total) by mouth daily at 6 PM. Patient taking differently: Take 40 mg by mouth at bedtime.  07/10/17  Yes Love, Ivan Anchors, PA-C  cinacalcet (SENSIPAR) 60 MG tablet TAKE 1 TABLET BY MOUTH DAILY WITH DINNER (DO NOT TAKE LESS THAN 12 HOURS PRIOR TO DIALYSIS) 11/18/17  Yes [provider]  cloNIDine (CATAPRES) 0.3 MG tablet Take 1 tablet (0.3 mg total) by mouth 3 (three) times daily. 07/10/17  Yes Love, Ivan Anchors, PA-C  collagenase (SANTYL) ointment Apply 1 application topically daily. 11/17/17  Yes Rhyne, Hulen Shouts, PA-C  ferric citrate (AURYXIA) 1 GM 210 MG(Fe) tablet Take 2 tablets (420 mg total) by mouth 3 (three) times daily with meals. 07/10/17  Yes Love, Ivan Anchors, PA-C  gabapentin (NEURONTIN) 100 MG capsule Take 1 capsule (100 mg  total) by mouth 2 (two) times daily. Patient taking differently: Take 100 mg by mouth at bedtime.  07/10/17  Yes Love, Pecolia Ades  HUMALOG KWIKPEN 100 UNIT/ML KiwkPen Inject 8 units SQ 3 times daily with meals 11/18/17  Yes [provider]  hydrALAZINE (APRESOLINE) 100 MG tablet Take 1 tablet (100 mg total) by mouth every 8 (eight) hours. 07/10/17  Yes Love, Ivan Anchors, PA-C  Insulin Glargine (LANTUS SOLOSTAR) 100 UNIT/ML Solostar Pen Inject 20 Units into the skin daily at 10 pm. Patient taking differently: Inject 30 Units into the skin daily at 10 pm.  09/26/17  Yes Domenic Polite, MD  isosorbide mononitrate (IMDUR) 120 MG 24 hr tablet Take 120 mg by mouth at bedtime.  09/09/17  Yes [provider]  labetalol (NORMODYNE) 300 MG tablet Take 1 tablet (300 mg total) by mouth 3 (three) times daily. 07/10/17  Yes Love, Ivan Anchors, PA-C  LORazepam (ATIVAN) 0.5 MG tablet Take 1 tablet (0.5 mg total) by mouth every 12 (twelve) hours as needed for anxiety. Patient taking differently: Take 0.5 mg by mouth daily.  07/10/17  Yes Love, Ivan Anchors, PA-C  multivitamin (RENA-VIT) TABS tablet Take 1 tablet by mouth every evening.   Yes [provider]  sevelamer carbonate (RENVELA) 800 MG tablet Take 3 tablets (2,400 mg total) by mouth 3 (three) times daily with meals. Give 3 tablets to = 2400 mg TID 07/10/17  Yes Love, Ivan Anchors, PA-C  metoCLOPramide (REGLAN) 5 MG tablet Take 0.5 tablets (2.5 mg total) by mouth 3 (three) times daily before meals. Patient not taking: Reported on 12/12/2017 07/10/17   Love, Ivan Anchors, PA-C  pantoprazole (PROTONIX) 40 MG tablet Take 1 tablet (40 mg total) by mouth daily. Patient not taking: Reported on 12/12/2017 07/10/17   Love, Ivan Anchors, PA-C  sertraline (ZOLOFT) 50 MG tablet Take 1 tablet (50 mg total) by mouth daily. Patient not taking: Reported on 12/12/2017 07/10/17   Bary Leriche, PA-C   Current Facility-Administered Medications  Medication Dose Route Frequency  Provider Last Rate Last Dose  . 0.9 %  sodium chloride infusion (Manually program via Guardrails IV Fluids)   Intravenous Once Serafina Mitchell, MD      . 0.9 %  sodium chloride infusion (Manually program via Guardrails IV Fluids)   Intravenous Once Lillia Abed, MD      . [  MAR Hold] 0.9 %  sodium chloride infusion  250 mL Intravenous PRN Serafina Mitchell, MD   Stopped at 12/20/2017 [phone]  . 0.9 %  sodium chloride infusion   Intravenous Continuous Lillia Abed, MD 10 mL/hr at 12/12/2017 1112    . 0.9 % irrigation (POUR BTL)    PRN Serafina Mitchell, MD   1,000 mL at 12/04/2017 1404  . 70 mL Visipaque 320 mg/mL mixed with 0.9% normal saline (30 mL) injection    PRN Serafina Mitchell, MD      . Doug Sou Hold] acetaminophen (TYLENOL) tablet 650 mg  650 mg Oral Q4H PRN Serafina Mitchell, MD      . Doug Sou Hold] amLODipine (NORVASC) tablet 10 mg  10 mg Oral BID Serafina Mitchell, MD   10 mg at 12/22/2017 0948  . [MAR Hold] aspirin EC tablet 81 mg  81 mg Oral Daily Serafina Mitchell, MD   81 mg at 12/24/2017 1743  . [MAR Hold] atorvastatin (LIPITOR) tablet 40 mg  40 mg Oral q1800 Serafina Mitchell, MD   40 mg at 12/09/2017 1743  . [MAR Hold] Chlorhexidine Gluconate Cloth 2 % PADS 6 each  6 each Topical Q0600 Penninger, [place], [place]   6 each at 12/04/2017 0513  . [MAR Hold] cinacalcet (SENSIPAR) tablet 60 mg  60 mg Oral Q breakfast Serafina Mitchell, MD      . Doug Sou Hold] cloNIDine (CATAPRES) tablet 0.3 mg  0.3 mg Oral TID Serafina Mitchell, MD   0.3 mg at 11/24/2017 0949  . [MAR Hold] ferric citrate (AURYXIA) tablet 420 mg  420 mg Oral TID WC Serafina Mitchell, MD   420 mg at 12/24/2017 1743  . [MAR Hold] gabapentin (NEURONTIN) capsule 100 mg  100 mg Oral BID Serafina Mitchell, MD   100 mg at 12/02/2017 2203  . heparin 6,000 Units in sodium chloride 0.9 % 500 mL irrigation    PRN Serafina Mitchell, MD      . Doug Sou Hold] hydrALAZINE (APRESOLINE) injection 5 mg  5 mg Intravenous Q20 Min PRN Serafina Mitchell, MD      . Doug Sou Hold] hydrALAZINE  (APRESOLINE) tablet 100 mg  100 mg Oral Q8H Serafina Mitchell, MD   100 mg at 12/08/2017 2204  . [MAR Hold] HYDROmorphone (DILAUDID) injection 0.5-1 mg  0.5-1 mg Intravenous Q2H PRN Serafina Mitchell, MD   1 mg at 12/15/2017 0957  . [MAR Hold] insulin aspart (novoLOG) injection 0-15 Units  0-15 Units Subcutaneous TID WC Serafina Mitchell, MD   2 Units at 12/03/2017 1739  . [MAR Hold] isosorbide mononitrate (IMDUR) 24 hr tablet 120 mg  120 mg Oral QHS Serafina Mitchell, MD   120 mg at 12/22/2017 2203  . [MAR Hold] labetalol (NORMODYNE) tablet 300 mg  300 mg Oral TID Serafina Mitchell, MD   300 mg at 11/26/2017 0949  . [MAR Hold] labetalol (NORMODYNE,TRANDATE) injection 10 mg  10 mg Intravenous Q10 min PRN Serafina Mitchell, MD      . Doug Sou Hold] LORazepam (ATIVAN) tablet 0.5 mg  0.5 mg Oral Daily PRN Serafina Mitchell, MD   0.5 mg at 12/17/17 0206  . [MAR Hold] multivitamin (RENA-VIT) tablet 1 tablet  1 tablet Oral QHS Serafina Mitchell, MD   1 tablet at 12/06/2017 2203  . [MAR Hold] ondansetron (ZOFRAN) injection 4 mg  4 mg Intravenous Q6H PRN Serafina Mitchell, MD      . [  MAR Hold] oxyCODONE (Oxy IR/ROXICODONE) immediate release tablet 5-10 mg  5-10 mg Oral Q4H PRN Serafina Mitchell, MD   10 mg at 12/14/2017 2202  . [MAR Hold] piperacillin-tazobactam (ZOSYN) IVPB 3.375 g  3.375 g Intravenous Q12H Hammons, Kimberly B, RPH 12.5 mL/hr at 12/04/2017 0948 3.375 g at 12/20/2017 0948  . [MAR Hold] sevelamer carbonate (RENVELA) tablet 2,400 mg  2,400 mg Oral TID WC Serafina Mitchell, MD   2,400 mg at 11/25/2017 1742  . [MAR Hold] sodium chloride flush (NS) 0.9 % injection 3 mL  3 mL Intravenous Q12H Serafina Mitchell, MD   3 mL at 11/24/2017 2213  . [MAR Hold] sodium chloride flush (NS) 0.9 % injection 3 mL  3 mL Intravenous PRN Serafina Mitchell, MD       Facility-Administered Medications Ordered in Other Encounters  Medication Dose Route Frequency Provider Last Rate Last Dose  . fentaNYL (SUBLIMAZE) injection    Anesthesia Intra-op Leonor Liv, CRNA   50 mcg at 11/25/2017 1215  . heparin injection    Anesthesia Intra-op Leonor Liv, CRNA   3,000 Units at 11/26/2017 1553  . midazolam (VERSED) injection    Anesthesia Intra-op Leonor Liv, CRNA   1 mg at 11/26/2017 1216  . propofol (DIPRIVAN) 10 mg/mL bolus/IV push    Anesthesia Intra-op Leonor Liv, CRNA   20 mg at 12/24/2017 1215  . propofol (DIPRIVAN) 500 MG/50ML infusion    Continuous PRN Leonor Liv, CRNA 24 mL/hr at 12/08/2017 1544 45 mcg/kg/min at 12/02/2017 1544   Labs: Basic Metabolic Panel: Recent Labs  Lab 12/12/2017 1014 12/13/2017 0309  NA 136 137  K 5.3* 5.3*  CL 96* 95*  CO2  --  28  GLUCOSE 126* 181*  BUN 63* 57*  CREATININE 5.70* 6.64*  CALCIUM  --  8.5*   CBC: Recent Labs  Lab 12/05/2017 1014 12/23/2017 0309  WBC  --  11.1*  HGB 10.5* 9.6*  HCT 31.0* 32.2*  MCV  --  95.5  PLT  --  256   CBG: Recent Labs  Lab 12/22/2017 1421 12/10/2017 1641 12/02/2017 2050 11/26/2017 0607 12/23/2017 1042  GLUCAP 115* 147* 154* 155* 161*   Studies/Results: No results found.  ROS: All others negative except those listed in HPI.  Physical Exam: Vitals:   12/19/2017 0507 12/08/2017 0948 11/24/2017 0949 12/17/17 1100  BP: 94/60 119/81    Pulse: (!) 128  74   Resp: 19     Temp: 98.8 F (37.1 C)     TempSrc: Oral     SpO2: 90%     Weight: 88.8 kg (195 lb 12.3 oz)   88.8 kg (195 lb 12.3 oz)  Height:    6' 3.98" (1.93 m)     General: NAD, chronically ill appearing female Head: NCAT sclera not icteric MMM Neck: Supple. No lymphadenopathy Lungs: CTA bilaterally. No wheeze, rales or rhonchi. Breathing is unlabored. Heart: RRR. No murmur, rubs or gallops.  Abdomen: soft, mildly distended, nontender, +BS, no guarding, no rebound tenderness M/S:  Equal strength b/l in upper and lower extremities.  Lower extremities:no edema b/l, R BKA Neuro: AAOx3. Moves all extremities spontaneously. Psych:  Responds to questions appropriately with a normal affect. Dialysis Access: LU AVF  +b/t  Dialysis Orders:  MWF - [facility]  4.25hrs, BFR  425, DFR 800,  EDW 80kg, 2K/ 2.5Ca  Access: LU AVF  Heparin none Mircera 225 mcg q2wks - last 12/12/17 Hectorol 77mcg IV  qHD    Assessment/Plan: 1.  L PVD w/gangrene and non healing R BKA - s/p aortogram yesterday with Dr. Russella Dar. Surgery today to include L fem endarterectomy, L superficial fem stent, digit amputation, and possible debridement of BKA.  On ABX. 2.  ESRD -  MWF HD.  Orders written for HD post surgery. Last K 5.8. Will continue per regular schedule while admitted.   3.  Hypertension/volume  - BP soft, Does not appear volume overloaded on exam.  Titrate down volume as tolerated.  4.  Anemia of CKD - Hgb 9.6, recently dosed with ESA. Follow trends.  5.  Secondary Hyperparathyroidism -  Ca ok. Will check RFP pre HD.  Continue VDRA, binders and sensipar once diet advanced. 6.  Nutrition - Renal diet with fluid restrictions 7. Recurrent ascites - stable. Last para 7/11. 8. CAD, CM, G3DD - per primary  Jen Mow, PA-C [facility] Pager: [phone] 12/09/2017, 4:07 PM   Pt seen, examined and agree w A/P as above.  Plan for HD tonight after surgery, get K+ down, gentle UF to avoid hypotension complications.   Kelly Splinter MD [employer] pager [phone]   11/29/2017, 5:33 PM

## 2017-12-18 ENCOUNTER — Encounter (HOSPITAL_COMMUNITY): Payer: Self-pay | Admitting: Surgery

## 2017-12-18 LAB — BPAM RBC
BLOOD PRODUCT EXPIRATION DATE: 201908102359
Blood Product Expiration Date: 201908102359
ISSUE DATE / TIME: 201907241349
ISSUE DATE / TIME: 201907241349
UNIT TYPE AND RH: 6200
Unit Type and Rh: 6200

## 2017-12-18 LAB — POCT I-STAT 7, (LYTES, BLD GAS, ICA,H+H)
Acid-base deficit: 25 mmol/L — ABNORMAL HIGH (ref 0.0–2.0)
BICARBONATE: 8 mmol/L — AB (ref 20.0–28.0)
Calcium, Ion: 1.09 mmol/L — ABNORMAL LOW (ref 1.15–1.40)
HCT: 34 % — ABNORMAL LOW (ref 36.0–46.0)
Hemoglobin: 11.6 g/dL — ABNORMAL LOW (ref 12.0–15.0)
O2 SAT: 45 %
PCO2 ART: 43.6 mmHg (ref 32.0–48.0)
PH ART: 6.871 — AB (ref 7.350–7.450)
Potassium: 5.4 mmol/L — ABNORMAL HIGH (ref 3.5–5.1)
Sodium: 162 mmol/L (ref 135–145)
TCO2: 9 mmol/L — AB (ref 22–32)
pO2, Arterial: 43 mmHg — ABNORMAL LOW (ref 83.0–108.0)

## 2017-12-18 LAB — BASIC METABOLIC PANEL
Anion gap: 16 — ABNORMAL HIGH (ref 5–15)
BUN: 30 mg/dL — AB (ref 6–20)
CALCIUM: 8.4 mg/dL — AB (ref 8.9–10.3)
CO2: 23 mmol/L (ref 22–32)
CREATININE: 4.49 mg/dL — AB (ref 0.44–1.00)
Chloride: 103 mmol/L (ref 98–111)
GFR calc Af Amer: 13 mL/min — ABNORMAL LOW (ref >60)
GFR, EST NON AFRICAN AMERICAN: 11 mL/min — AB (ref >60)
Glucose, Bld: 120 mg/dL — ABNORMAL HIGH (ref 70–99)
Potassium: 4.2 mmol/L (ref 3.5–5.1)
Sodium: 142 mmol/L (ref 135–145)

## 2017-12-18 LAB — GLUCOSE, CAPILLARY
GLUCOSE-CAPILLARY: 122 mg/dL — AB (ref 70–99)
GLUCOSE-CAPILLARY: 121 mg/dL — AB (ref 70–99)
GLUCOSE-CAPILLARY: 73 mg/dL (ref 70–99)
Glucose-Capillary: 133 mg/dL — ABNORMAL HIGH (ref 70–99)
Glucose-Capillary: 108 mg/dL — ABNORMAL HIGH (ref 70–99)

## 2017-12-18 LAB — TYPE AND SCREEN
ABO/RH(D): A POS
ANTIBODY SCREEN: NEGATIVE
Unit division: 0
Unit division: 0

## 2017-12-18 LAB — POCT I-STAT 4, (NA,K, GLUC, HGB,HCT)
GLUCOSE: 156 mg/dL — AB (ref 70–99)
HEMATOCRIT: 21 % — AB (ref 36.0–46.0)
Hemoglobin: 7.1 g/dL — ABNORMAL LOW (ref 12.0–15.0)
SODIUM: 127 mmol/L — AB (ref 135–145)

## 2017-12-18 LAB — CBC
HCT: 29.7 % — ABNORMAL LOW (ref 36.0–46.0)
Hemoglobin: 9.3 g/dL — ABNORMAL LOW (ref 12.0–15.0)
MCH: 28.8 pg (ref 26.0–34.0)
MCHC: 31.3 g/dL (ref 30.0–36.0)
MCV: 92 fL (ref 78.0–100.0)
PLATELETS: 196 10*3/uL (ref 150–400)
RBC: 3.23 MIL/uL — ABNORMAL LOW (ref 3.87–5.11)
RDW: 17.2 % — AB (ref 11.5–15.5)
WBC: 8.6 10*3/uL (ref 4.0–10.5)

## 2017-12-18 MED ORDER — VANCOMYCIN HCL IN DEXTROSE 1-5 GM/200ML-% IV SOLN: 1000.0000 mg | INTRAVENOUS | Status: DC

## 2017-12-18 MED ORDER — SODIUM CHLORIDE 0.9% FLUSH: 9.0000 mL | INTRAVENOUS | Status: DC | PRN

## 2017-12-18 MED ORDER — ONDANSETRON HCL 4 MG/2ML IJ SOLN: 4.0000 mg | Freq: Four times a day (QID) | INTRAMUSCULAR | Status: DC | PRN

## 2017-12-18 MED ORDER — HYDROMORPHONE 1 MG/ML IV SOLN
INTRAVENOUS | Status: DC
  Administered 2017-12-18: 1.2 mg via INTRAVENOUS
  Administered 2017-12-18: 1.5 mg via INTRAVENOUS
  Administered 2017-12-18: 0.6 mg via INTRAVENOUS
  Administered 2017-12-18: 0.5 mg via INTRAVENOUS
  Administered 2017-12-19: 0.9 mg via INTRAVENOUS
  Filled 2017-12-18 (×2): qty 25

## 2017-12-18 MED ORDER — GABAPENTIN 300 MG PO CAPS
300.0000 mg | ORAL_CAPSULE | Freq: Three times a day (TID) | ORAL | Status: DC
  Administered 2017-12-18 – 2017-12-20 (×7): 300 mg via ORAL
  Filled 2017-12-18 (×8): qty 1

## 2017-12-18 MED ORDER — NALOXONE HCL 0.4 MG/ML IJ SOLN: 0.4000 mg | INTRAMUSCULAR | Status: DC | PRN

## 2017-12-18 MED ORDER — CHLORHEXIDINE GLUCONATE CLOTH 2 % EX PADS
6.0000 | MEDICATED_PAD | Freq: Every day | CUTANEOUS | Status: DC
  Administered 2017-12-20 – 2017-12-21 (×2): 6 via TOPICAL

## 2017-12-18 MED ORDER — DIPHENHYDRAMINE HCL 50 MG/ML IJ SOLN: 12.5000 mg | Freq: Four times a day (QID) | INTRAMUSCULAR | Status: DC | PRN

## 2017-12-18 MED ORDER — DIPHENHYDRAMINE HCL 12.5 MG/5ML PO ELIX
12.5000 mg | ORAL_SOLUTION | Freq: Four times a day (QID) | ORAL | Status: DC | PRN
  Filled 2017-12-18: qty 5

## 2017-12-18 NOTE — Progress Notes (Signed)
Pharmacy Antibiotic Note  [name] is a 42 y.o. female admitted on 12/20/2017 with gangrenous changes to L 2nd, 3rd toes.  Pharmacy has been consulted for Vancomycin and Zosyn dosing.  Pt has ESRD and dialyzes on MWF outpatient.  Last HD on 7/24.   Patient received a 3 gram vancomycin dose followed by HD on 7/24. Per pharmacokinetics calculations vancomycin concentration ~21 mcg/mL today.   Trough goal: 10-15  WBC trending down and afebrile.   Plan: Zosyn 3.375 gm IV q12h (4 hour infusion). Hold vancomycin dose with HD on 7/26 Resume Vancomycin 1g with HD on 7/29   Height: 6\' 4"  (193 cm) Weight: 198 lb 6.6 oz (90 kg) IBW/kg (Calculated) : 82.3  Temp (24hrs), Avg:99 F (37.2 C), Min:97.9 F (36.6 C), Max:99.8 F (37.7 C)  Gwenlyn Found, [place] D PGY1 Pharmacy Resident  Phone [phone] 12/18/2017   11:25 AM Recent Labs  Lab 12/08/2017 1014 12/08/2017 0309 12/23/2017 1935 11/26/2017 2111 12/18/17 0249  WBC  --  11.1*  --  12.5* 8.6  CREATININE 5.70* 6.64* 7.02* 7.14* 4.49*    Estimated Creatinine Clearance: 21.4 mL/min (A) (by C-G formula based on SCr of 4.49 mg/dL (H)).    Allergies  Allergen Reactions  . Cephalexin Other (See Comments)    Reaction unknown- Childhood allergy Tolerated Ceftriaxone, ceftazidime, zosyn, augmentin in the past  . Sulfamethoxazole-Trimethoprim Other (See Comments)    Unknown reaction. Pt states that she was told by her mother that she had allergy to Bactrim as a child.    Antimicrobials this admission: Vanc 7/24 >> Zosyn 7/23 >>      Thank you for allowing pharmacy to be a part of this patient's care.  Gwenlyn Found, Sherian Rein D PGY1 Pharmacy Resident  Phone [phone] 12/18/2017   11:26 AM

## 2017-12-18 NOTE — Progress Notes (Signed)
HD tx ended 1.5hr early @ 0130 d/t pt insistence of stopping tx d/t pain despite prn pain med being offered to see if it would help alleviate pain enough to get completed tx, pt refused prn pain meds stating she just wanted off the treatment, UF goal not met, blood rinsed back, VSS, report called to Matt York, RN 

## 2017-12-18 NOTE — Anesthesia Postprocedure Evaluation (Signed)
Anesthesia Post Note  Patient: [name]  Procedure(s) Performed: LEFT SUPERFICIAL FEMORAL ARTERY ENDARTERECTOMY (Left Groin) INSERTION OF LEFT FEMORAL ELUVIA STENT (Left ) AMPUTATION TOE (Left Foot) PATCH ANGIOPLASTY USING XENOSURE PATCH (Left Groin) IRRIGATION AND DEBRIDEMENT OF RIGHT BELOW KNEE AMPUTATION (Right )     Patient location during evaluation: PACU Anesthesia Type: Spinal Level of consciousness: oriented and awake and alert Pain management: pain level controlled Vital Signs Assessment: post-procedure vital signs reviewed and stable Respiratory status: spontaneous breathing, respiratory function stable and patient connected to nasal cannula oxygen Cardiovascular status: blood pressure returned to baseline and stable Postop Assessment: no headache, no backache and no apparent nausea or vomiting Anesthetic complications: no    Last Vitals:  Vitals:   12/18/17 1600 12/18/17 1745  BP:  108/74  Pulse:    Resp: 15   Temp:    SpO2: 95%     Last Pain:  Vitals:   12/18/17 1600  TempSrc:   PainSc: 7                  Geetika Laborde DAVID

## 2017-12-18 NOTE — Progress Notes (Signed)
HD tx initiated via 15Gx2 w/o problem, pull/push/flush well w/o problem, VSS, will cont to monitor while on HD tx 

## 2017-12-18 NOTE — Evaluation (Addendum)
Physical Therapy Evaluation Patient Details Name: [name] MRN: [id number] DOB: 11-17-1975 Today's Date: 12/18/2017   History of Present Illness  [name] is a 42 y.o. female. ESRD 2/2 DM on HD MWF at [facility], first starting in 07/2010.  Past medical history significant for DM with retinopathy, CAD h/o NSTEMI, G3DD, restrictive CM w/EF 50-55%, pulm HTN,  h/o sacral decubitus ulcer, h/o ascites w/recurrent paracentesis last 12/04/17 by [facility]; PVD s/p CFA, profunda, SFA and EIA endarterectomy on 04/16/17 by Dr. 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 Trula Slade.   Patient was seen by [facility] in office for wound check where they found her R stump was no longer healing and looked concerning for infection as well as LLE PVD with gangrene.  Pt had right BKA I& D as well as left toes amputation as well as left endarterectomy.  Placed wound VAC on left groin.   Clinical Impression  Pt admitted with above diagnosis. Pt currently with functional limitations due to the deficits listed below (see PT Problem List). Pt only able to sit eOB for 8 minutes.  Mod assist to come to EOB.  Husband present and was assisting pt and states they have been doing this since February.  Will follow acutely. REcommend air mattress overlay for pt due to pressure areas.  Paged MD. Pt will benefit from skilled PT to increase their independence and safety with mobility to allow discharge to the venue listed below.      Follow Up Recommendations Home health PT;Supervision/Assistance - 24 hour    Equipment Recommendations  None recommended by PT    Recommendations for Other Services       Precautions / Restrictions Precautions Precautions: Fall Restrictions Weight Bearing Restrictions: No      Mobility  Bed Mobility Overal bed mobility: Needs Assistance Bed Mobility: Rolling;Sidelying to Sit Rolling: Min assist Sidelying to sit: Mod assist;+2 for physical assistance       General bed mobility comments:  Needed mod asisst to mmove LES and elevate trunk to sit EOB.   Transfers                 General transfer comment: Refused to get OOB  Ambulation/Gait                Stairs            Wheelchair Mobility    Modified Rankin (Stroke Patients Only)       Balance Overall balance assessment: Needs assistance Sitting-balance support: Bilateral upper extremity supported;Feet supported Sitting balance-Leahy Scale: Poor Sitting balance - Comments: At times pt was able to sit with min guard but at times pt needing mod assist to sit EOB.                                       Pertinent Vitals/Pain Pain Assessment: 0-10 Pain Score: 8  Pain Location: left toes and right BK amputation site Pain Descriptors / Indicators: Aching;Grimacing;Guarding Pain Intervention(s): Limited activity within patient's tolerance;Monitored during session;Repositioned   VSS Home Living Family/patient expects to be discharged to:: Private residence Living Arrangements: Spouse/significant other;Children(daughter) Available Help at Discharge: Family;Available 24 hours/day Type of Home: House Home Access: Level entry     Home Layout: One level Home Equipment: Wheelchair - manual;Bedside commode;Wheelchair - power(has drop arm commode, sliding board)      Prior Function Level of Independence: Independent  with assistive device(s)         Comments: Pt was independent with sliding board transfers and husband would help pt as well when needed as sometimes pt would need lift to chair. Discussed body mechanics.  Pt sponge bathed in bed at home.      Hand Dominance        Extremity/Trunk Assessment   Upper Extremity Assessment Upper Extremity Assessment: Defer to OT evaluation    Lower Extremity Assessment Lower Extremity Assessment: RLE deficits/detail;LLE deficits/detail RLE Deficits / Details: could not extend knee fully due to pain but pt and husband state no  contractures prior to surgery LLE Deficits / Details: grossly 2+/5  LLE: Unable to fully assess due to pain    Cervical / Trunk Assessment Cervical / Trunk Assessment: Kyphotic  Communication   Communication: No difficulties  Cognition Arousal/Alertness: Awake/alert Behavior During Therapy: Anxious Overall Cognitive Status: Within Functional Limits for tasks assessed                                        General Comments General comments (skin integrity, edema, etc.): wound VAC left groin    Exercises General Exercises - Lower Extremity Quad Sets: AROM;Both;10 reps;Supine Long Arc Quad: AROM;Both;10 reps;Seated   Assessment/Plan    PT Assessment Patient needs continued PT services  PT Problem List Decreased activity tolerance;Decreased balance;Decreased mobility;Decreased knowledge of use of DME;Decreased safety awareness;Decreased knowledge of precautions;Decreased strength;Decreased range of motion;Pain;Decreased skin integrity       PT Treatment Interventions DME instruction;Gait training;Functional mobility training;Therapeutic activities;Therapeutic exercise;Balance training;Patient/family education;Wheelchair mobility training    PT Goals (Current goals can be found in the Care Plan section)  Acute Rehab PT Goals Patient Stated Goal: to get better and go home PT Goal Formulation: With patient Time For Goal Achievement: 01/01/18 Potential to Achieve Goals: Good    Frequency Min 3X/week   Barriers to discharge        Co-evaluation               AM-PAC PT "6 Clicks" Daily Activity  Outcome Measure Difficulty turning over in bed (including adjusting bedclothes, sheets and blankets)?: Unable Difficulty moving from lying on back to sitting on the side of the bed? : Unable Difficulty sitting down on and standing up from a chair with arms (e.g., wheelchair, bedside commode, etc,.)?: Unable Help needed moving to and from a bed to chair  (including a wheelchair)?: Total Help needed walking in hospital room?: Total Help needed climbing 3-5 steps with a railing? : Total 6 Click Score: 6    End of Session   Activity Tolerance: Patient limited by fatigue Patient left: in bed;with call bell/phone within reach;with bed alarm set;with family/visitor present Nurse Communication: Mobility status PT Visit Diagnosis: Muscle weakness (generalized) (M62.81);Pain;Other abnormalities of gait and mobility (R26.89) Pain - Right/Left: (bil) Pain - part of body: Leg    Time: 0951-1010 PT Time Calculation (min) (ACUTE ONLY): 19 min   Charges:   PT Evaluation $PT Eval Moderate Complexity: 1 Mod     PT G Codes:        Kore Madlock,PT Acute Rehabilitation [phone] (pager)   Denice Paradise 12/18/2017, 11:05 AM

## 2017-12-18 NOTE — Evaluation (Signed)
Occupational Therapy Evaluation Patient Details Name: BREIA OCAMPO MRN: [id number] DOB: [birth year] Today's Date: 12/18/2017    History of Present Illness [name] is a 42 y.o. female. ESRD 2/2 DM on HD MWF at [facility], first starting in 07/2010.  Past medical history significant for DM with retinopathy, CAD h/o NSTEMI, G3DD, restrictive CM w/EF 50-55%, pulm HTN,  h/o sacral decubitus ulcer, h/o ascites w/recurrent paracentesis last 12/04/17 by [facility] IR; PVD s/p CFA, profunda, SFA and EIA endarterectomy on 04/16/17 by Dr. 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 Trula Slade.   Patient was seen by [facility] in office for wound check where they found her R stump was no longer healing and looked concerning for infection as well as LLE PVD with gangrene.  Pt had right BKA I& D as well as left toes amputation as well as left endarterectomy.  Placed wound VAC on left groin.    Clinical Impression   Pt reports she was mod I with ADL at bed level and used slide board for transfers PTA. OT eval limited by severe pain; repositioned in bed with supervision, applied ice, and encouraged use of PCA. Pt planning to d/c home with 24/7 supervision from family. Recommending HHOT for follow up to maximize independence and safety with ADL and functional mobility upon return home. Pt would benefit from continued skilled OT to address established goals.    Follow Up Recommendations  Home health OT;Supervision/Assistance - 24 hour    Equipment Recommendations  None recommended by OT    Recommendations for Other Services       Precautions / Restrictions Precautions Precautions: Fall Restrictions Weight Bearing Restrictions: No      Mobility Bed Mobility Overal bed mobility: Needs Assistance Bed Mobility: Rolling Rolling: Supervision         General bed mobility comments: Pt able to roll from R side to L side with supervision, HOB flat with use of bed rails  Transfers                       Balance                                           ADL either performed or assessed with clinical judgement   ADL Overall ADL's : Needs assistance/impaired                                       General ADL Comments: Pain significantly limiting session today. Pt able to roll with supervision for repositioning in bed. Needs max cues for use of PCA.      Vision         Perception     Praxis      Pertinent Vitals/Pain Pain Assessment: Faces Faces Pain Scale: Hurts whole lot Pain Location: R BK amputation Pain Descriptors / Indicators: Aching;Grimacing;Guarding Pain Intervention(s): Monitored during session;Limited activity within patient's tolerance;Repositioned;PCA encouraged;Ice applied     Hand Dominance     Extremity/Trunk Assessment Upper Extremity Assessment Upper Extremity Assessment: Overall WFL for tasks assessed   Lower Extremity Assessment Lower Extremity Assessment: Defer to PT evaluation       Communication Communication Communication: No difficulties   Cognition Arousal/Alertness: Awake/alert Behavior During Therapy: Anxious;Restless Overall Cognitive Status: Within Functional Limits for  tasks assessed                                     General Comments       Exercises     Shoulder Instructions      Home Living Family/patient expects to be discharged to:: Private residence Living Arrangements: Spouse/significant other;Children Available Help at Discharge: Family;Available 24 hours/day Type of Home: House Home Access: Level entry     Home Layout: One level     Bathroom Shower/Tub: Teacher, early years/pre: Standard     Home Equipment: Wheelchair - manual;Bedside commode;Wheelchair - power(drop arm BSC, slide board)          Prior Functioning/Environment Level of Independence: Needs assistance  Gait / Transfers Assistance Needed: Mod I with slide board transfers ADL's /  Homemaking Assistance Needed: Pt reports she uses briefs typically and occasional use of BSC. Sponge bathes at bed level            OT Problem List: Decreased strength;Decreased activity tolerance;Impaired balance (sitting and/or standing);Decreased knowledge of precautions;Pain      OT Treatment/Interventions: Self-care/ADL training;Therapeutic exercise;Energy conservation;DME and/or AE instruction;Therapeutic activities;Patient/family education;Balance training    OT Goals(Current goals can be found in the care plan section) Acute Rehab OT Goals Patient Stated Goal: decrease pain OT Goal Formulation: With patient Time For Goal Achievement: 01/01/18 Potential to Achieve Goals: Good ADL Goals Pt Will Perform Lower Body Bathing: with supervision;sitting/lateral leans Pt Will Perform Lower Body Dressing: with supervision;sitting/lateral leans Pt Will Transfer to Toilet: with supervision;with transfer board;bedside commode Pt Will Perform Toileting - Clothing Manipulation and hygiene: with supervision;sitting/lateral leans  OT Frequency: Min 2X/week   Barriers to D/C:            Co-evaluation              AM-PAC PT "6 Clicks" Daily Activity     Outcome Measure Help from another person eating meals?: A Lot Help from another person taking care of personal grooming?: A Lot Help from another person toileting, which includes using toliet, bedpan, or urinal?: A Lot Help from another person bathing (including washing, rinsing, drying)?: A Lot Help from another person to put on and taking off regular upper body clothing?: A Lot Help from another person to put on and taking off regular lower body clothing?: A Lot 6 Click Score: 12   End of Session Equipment Utilized During Treatment: Oxygen Nurse Communication: Other (comment)(pt needs cues for use of PCA)  Activity Tolerance: Patient limited by pain Patient left: in bed;with call bell/phone within reach  OT Visit Diagnosis:  Other abnormalities of gait and mobility (R26.89);Pain Pain - Right/Left: Right Pain - part of body: Leg                Time: 4193-7902 OT Time Calculation (min): 17 min Charges:  OT General Charges $OT Visit: 1 Visit OT Evaluation $OT Eval Moderate Complexity: 1 Mod  Idora Brosious A. Ulice Brilliant, M.S., OTR/L Acute Rehab Department: [phone]  Binnie Kand 12/18/2017, 4:38 PM

## 2017-12-18 NOTE — Progress Notes (Signed)
[facility]  Subjective  - Doing OK over all post op.  Incisional pains.   Objective 129/79 (!) 107 99.4 F (37.4 C) (Oral) 20 97%  Intake/Output Summary (Last 24 hours) at 12/18/2017 0902 Last data filed at 12/18/2017 0700 Gross per 24 hour  Intake 1558.58 ml  Output 1026 ml  Net 532.58 ml    Right BKA dry dressing with ace, clean. Left groin praveena wound vac to suction no drainage output. Left foot dressing s/p second and third toe amputations  Heart RRR Lungs non labored breathing    Assessment/Planning: POD #1 Procedure:   #1: Endarterectomy of the left distal external iliac, common femoral, profundofemoral, and superficial femoral artery with bovine pericardial patch angioplasty                         #2: Stent, left external iliac artery                         #3: Abdominal and iliac artery angiogram                         #4: Left lower extremity angiogram                         #5: Stent, left superficial femoral artery                         #6: Amputation including metatarsal head left second and third toes                         #7: Incision and drainage including skin and subcutaneous tissue, right below-knee amputation                         #8: Placement of Praveena wound VAC to the left femoral incision   Specimen Description WOUND   Special Requests BKA RIGHT KNEE SWAB OF SOFT TISSUE   Gram Stain MODERATE WBC PRESENT,BOTH PMN AND MONONUCLEAR  FEW GRAM POSITIVE COCCI  FEW GRAM VARIABLE ROD     IV antibiotics Zosyn currently Heparin for DVT prophylaxis HD was performed yesterday, M-W-F schedule.  Completion angiography was then performed which showed a widely patent superficial femoral artery and popliteal artery.  Tibial vessels were unchanged.  Runoff onto the foot was severely limited distal the pedal arch. Dr. Trula Slade discussed future needs could possibly be left transmet verse BKA.  Revision of right BKA  higher verses AKA. Continue praveena wound vac for 7-10 days left groin. Plan to start dressing changes on the right BKA and left foot tomorrow.    Roxy Horseman 12/18/2017 9:02 AM --  Laboratory Lab Results: Recent Labs    12/13/2017 2111 12/18/17 0249  WBC 12.5* 8.6  HGB 10.2* 9.3*  HCT 33.0* 29.7*  PLT 228 196   BMET Recent Labs    12/04/2017 2111 12/18/17 0249  NA 138 142  K 6.1* 4.2  CL 97* 103  CO2 24 23  GLUCOSE 118* 120*  BUN 61* 30*  CREATININE 7.14* 4.49*  CALCIUM 8.6* 8.4*    COAG Lab Results  Component Value Date   INR 1.15 12/04/2017   INR 1.14 06/17/2017   INR 1.20 06/11/2017   No results found for: PTT

## 2017-12-18 NOTE — Progress Notes (Signed)
[facility] Progress Note  Subjective: no new c/o, "you didn't pull any fluid off last night" on hd.  -0.5 L pulled.  No new c/o.   Vitals:   12/18/17 0700 12/18/17 0900 12/18/17 0945 12/18/17 1100  BP:   128/82   Pulse: (!) 106 (!) 111  (!) 36  Resp:      Temp:      TempSrc:      SpO2: 99% 100%  96%  Weight:      Height:        Inpatient medications: . amLODipine  10 mg Oral BID  . aspirin EC  81 mg Oral Daily  . atorvastatin  40 mg Oral q1800  . Chlorhexidine Gluconate Cloth  6 each Topical Q0600  . cinacalcet  60 mg Oral Q breakfast  . cloNIDine  0.3 mg Oral TID  . docusate sodium  100 mg Oral Daily  . ferric citrate  420 mg Oral TID WC  . gabapentin  300 mg Oral TID  . hydrALAZINE  100 mg Oral Q8H  . HYDROmorphone   Intravenous Q4H  . insulin aspart  0-15 Units Subcutaneous TID WC  . isosorbide mononitrate  120 mg Oral QHS  . labetalol  300 mg Oral TID  . multivitamin  1 tablet Oral QHS  . pantoprazole  40 mg Oral Daily  . sevelamer carbonate  2,400 mg Oral TID WC  . sodium chloride flush  3 mL Intravenous Q12H   . sodium chloride Stopped (12/23/2017 0514)  . sodium chloride    . piperacillin-tazobactam (ZOSYN)  IV 3.375 g (12/18/17 1135)  . [START ON 12/22/2017] vancomycin     sodium chloride, sodium chloride, acetaminophen **OR** acetaminophen, alteplase, alum & mag hydroxide-simeth, diphenhydrAMINE **OR** diphenhydrAMINE, guaiFENesin-dextromethorphan, heparin, heparin, hydrALAZINE, labetalol, lidocaine (PF), lidocaine-prilocaine, LORazepam, metoprolol tartrate, morphine injection, naloxone **AND** sodium chloride flush, ondansetron (ZOFRAN) IV, ondansetron (ZOFRAN) IV, oxyCODONE, pentafluoroprop-tetrafluoroeth, phenol, sodium chloride flush  Iron/TIBC/Ferritin/ %Sat    Component Value Date/Time   IRON 26 (L) 06/23/2017 0708   TIBC 113 (L) 06/23/2017 0708   FERRITIN 1,050 (H) 06/23/2017 0708   IRONPCTSAT 23 06/23/2017 0708    Exam: nad chron ill  appearing female  no jvd  chest cta bilat  cor reg no mrg  abd +diffuse nontense ascites nontender +bs  ext no sig LE edema, R BKA  NF, ox 3  LUa AVF +bruit  Dialysis: MWF [facility]  4h 68min  80kg   2/2.5 bath  LUA AVF  Hep none - mircera 225 mcg q2wks - last 12/12/17 - hectorol 62mcg IV qHD        Impression: 1 PAD / L foot gangrene - sp revasc / toe amps per surg 2 I&D R BKA 3 ESRD mwf HD 4 Vol excess - chronic issue; up 10kg declined offer of extra HD today 5 Ascites - requires intermittent paracentesis 6 Hyperkalemia - better 7 Anemia recently got esa, follow 8 CAD/ CM/ G3DD 9  HTN severe, mult meds  Plan - HD tomorrow max UF   Kelly Splinter MD [facility] pager [phone]   12/18/2017, 12:07 PM   Recent Labs  Lab 12/18/2017 0309  12/06/2017 2111 12/18/17 0249  NA 137   < > 138 142  K 5.3*   < > 6.1* 4.2  CL 95*   < > 97* 103  CO2 28   < > 24 23  GLUCOSE 181*   < > 118* 120*  BUN 57*   < >  61* 30*  CREATININE 6.64*   < > 7.14* 4.49*  CALCIUM 8.5*   < > 8.6* 8.4*  PHOS  --   --  9.0*  --   ALBUMIN  --   --  2.1*  --   INR 1.15  --   --   --    < > = values in this interval not displayed.   No results for input(s): AST, ALT, ALKPHOS, BILITOT, PROT in the last 168 hours. Recent Labs  Lab 11/28/2017 2111 12/18/17 0249  WBC 12.5* 8.6  HGB 10.2* 9.3*  HCT 33.0* 29.7*  MCV 94.0 92.0  PLT 228 196

## 2017-12-19 LAB — RENAL FUNCTION PANEL
ANION GAP: 14 (ref 5–15)
Albumin: 1.8 g/dL — ABNORMAL LOW (ref 3.5–5.0)
BUN: 49 mg/dL — ABNORMAL HIGH (ref 6–20)
CALCIUM: 8.4 mg/dL — AB (ref 8.9–10.3)
CO2: 25 mmol/L (ref 22–32)
Chloride: 99 mmol/L (ref 98–111)
Creatinine, Ser: 6.9 mg/dL — ABNORMAL HIGH (ref 0.44–1.00)
GFR calc Af Amer: 8 mL/min — ABNORMAL LOW (ref >60)
GFR calc non Af Amer: 7 mL/min — ABNORMAL LOW (ref >60)
GLUCOSE: 177 mg/dL — AB (ref 70–99)
PHOSPHORUS: 9.3 mg/dL — AB (ref 2.5–4.6)
Potassium: 5.1 mmol/L (ref 3.5–5.1)
Sodium: 138 mmol/L (ref 135–145)

## 2017-12-19 LAB — CBC
HEMATOCRIT: 27.1 % — AB (ref 36.0–46.0)
HEMOGLOBIN: 8.2 g/dL — AB (ref 12.0–15.0)
MCH: 29.2 pg (ref 26.0–34.0)
MCHC: 30.3 g/dL (ref 30.0–36.0)
MCV: 96.4 fL (ref 78.0–100.0)
Platelets: 194 10*3/uL (ref 150–400)
RBC: 2.81 MIL/uL — ABNORMAL LOW (ref 3.87–5.11)
RDW: 17.8 % — AB (ref 11.5–15.5)
WBC: 12.6 10*3/uL — ABNORMAL HIGH (ref 4.0–10.5)

## 2017-12-19 LAB — GLUCOSE, CAPILLARY
GLUCOSE-CAPILLARY: 182 mg/dL — AB (ref 70–99)
Glucose-Capillary: 177 mg/dL — ABNORMAL HIGH (ref 70–99)
Glucose-Capillary: 176 mg/dL — ABNORMAL HIGH (ref 70–99)

## 2017-12-19 MED ORDER — MORPHINE SULFATE 2 MG/ML IV SOLN
INTRAVENOUS | Status: DC
  Administered 2017-12-19: 10:00:00 via INTRAVENOUS
  Administered 2017-12-19: 9 mg via INTRAVENOUS
  Administered 2017-12-20: 7.5 mg via INTRAVENOUS
  Administered 2017-12-20: 15 mg via INTRAVENOUS
  Administered 2017-12-20: 6.5 mg via INTRAVENOUS
  Administered 2017-12-21: 6 mg via INTRAVENOUS
  Filled 2017-12-19 (×2): qty 30

## 2017-12-19 MED ORDER — SODIUM CHLORIDE 0.9% FLUSH: 9.0000 mL | INTRAVENOUS | Status: DC | PRN

## 2017-12-19 MED ORDER — ONDANSETRON HCL 4 MG/2ML IJ SOLN
4.0000 mg | Freq: Four times a day (QID) | INTRAMUSCULAR | Status: DC | PRN
  Administered 2017-12-21: 4 mg via INTRAVENOUS
  Filled 2017-12-19: qty 2

## 2017-12-19 MED ORDER — NALOXONE HCL 0.4 MG/ML IJ SOLN: 0.4000 mg | INTRAMUSCULAR | Status: DC | PRN

## 2017-12-19 MED ORDER — DIPHENHYDRAMINE HCL 50 MG/ML IJ SOLN: 12.5000 mg | Freq: Four times a day (QID) | INTRAMUSCULAR | Status: DC | PRN

## 2017-12-19 MED ORDER — DIPHENHYDRAMINE HCL 12.5 MG/5ML PO ELIX
12.5000 mg | ORAL_SOLUTION | Freq: Four times a day (QID) | ORAL | Status: DC | PRN
  Filled 2017-12-19: qty 5

## 2017-12-19 NOTE — Progress Notes (Signed)
PT Cancellation Note  Patient Details Name: [name] MRN: [id number] DOB: 02/03/1976   Cancelled Treatment:    Reason Eval/Treat Not Completed: Patient at procedure or test/unavailable.  Pt in HD.  Will f/u as time allows.   Michel Santee 12/19/2017, 10:27 AM

## 2017-12-19 NOTE — Progress Notes (Signed)
OT Cancellation Note  Patient Details Name: [name] MRN: [id number] DOB: 1976/05/08   Cancelled Treatment:    Reason Eval/Treat Not Completed: Patient at procedure or test/ unavailable.Pt in HD.  Will f/u as time allows.    Almon Register [phone] 12/19/2017, 11:11 AM

## 2017-12-19 NOTE — Progress Notes (Signed)
    Subjective  - POD #2  S/p left femoral endarterectomy and patch angioplasty with I&D of right BKA stump and amputation of left 2,3 toes  C/o pain at right BKA site.  Started on Dilaudid PCA HD yesterday  Physical Exam:  Incisional vac to left groin Dressings changed No bone exposed on right BKA Marginal toe amputation site       Assessment/Plan:  POD #2  Discussed continuing dressing changes.  She is at very high risk for additional amputations on both legs.  Will continue dressings until wounds declare themselves  HD per renal  PCA changed to Morphine Treating itching  [name] 12/19/2017 5:48 PM --  Vitals:   12/19/17 1300 12/19/17 1340  BP: (!) 147/78 124/60  Pulse: 83 81  Resp: 18 18  Temp:  99.6 F (37.6 C)  SpO2:  92%    Intake/Output Summary (Last 24 hours) at 12/19/2017 1748 Last data filed at 12/19/2017 1325 Gross per 24 hour  Intake 475.53 ml  Output 4512 ml  Net -4036.47 ml     Laboratory CBC    Component Value Date/Time   WBC 12.6 (H) 12/19/2017 0947   HGB 8.2 (L) 12/19/2017 0947   HCT 27.1 (L) 12/19/2017 0947   PLT 194 12/19/2017 0947    BMET    Component Value Date/Time   NA 138 12/19/2017 0947   K 5.1 12/19/2017 0947   CL 99 12/19/2017 0947   CO2 25 12/19/2017 0947   GLUCOSE 177 (H) 12/19/2017 0947   BUN 49 (H) 12/19/2017 0947   CREATININE 6.90 (H) 12/19/2017 0947   CALCIUM 8.4 (L) 12/19/2017 0947   GFRNONAA 7 (L) 12/19/2017 0947   GFRAA 8 (L) 12/19/2017 0947    COAG Lab Results  Component Value Date   INR 1.15 12/20/2017   INR 1.14 06/17/2017   INR 1.20 06/11/2017   No results found for: PTT  Antibiotics Anti-infectives (From admission, onward)   Start     Dose/Rate Route Frequency Ordered Stop   12/22/17 1200  vancomycin (VANCOCIN) IVPB 1000 mg/200 mL premix     1,000 mg 200 mL/hr over 60 Minutes Intravenous Every M-W-F (Hemodialysis) 12/18/17 1132     12/05/2017 1115  vancomycin (VANCOCIN) 1,500 mg in  sodium chloride 0.9 % 500 mL IVPB     1,500 mg 250 mL/hr over 120 Minutes Intravenous To Short Stay 12/23/2017 1106 12/13/2017 1436   12/03/2017 0600  vancomycin (VANCOCIN) 1,500 mg in sodium chloride 0.9 % 500 mL IVPB     1,500 mg 250 mL/hr over 120 Minutes Intravenous  Once 12/20/2017 2046 12/19/2017 0714   12/22/2017 2200  piperacillin-tazobactam (ZOSYN) IVPB 3.375 g     3.375 g 12.5 mL/hr over 240 Minutes Intravenous Every 12 hours 12/20/2017 2046         V. Leia Alf, M.D. [facility] Office: [phone] Pager:  [phone]

## 2017-12-19 NOTE — Progress Notes (Signed)
Dilaudid PCA medication d/c.d. Per Laurence Slate PA. Morphine PCA ordered. See mar. Dilaudid 16 ml wasted with Kristin Bruins RN.

## 2017-12-19 NOTE — Progress Notes (Signed)
[facility] Progress Note  Subjective:   Vitals:   12/19/17 0023 12/19/17 0352 12/19/17 0400 12/19/17 0951  BP: 121/72  132/78   Pulse:      Resp:  15  20  Temp: 100.3 F (37.9 C)  98.8 F (37.1 C)   TempSrc: Oral  Oral   SpO2: 96% 94% 94%   Weight:   88.4 kg (194 lb 14.2 oz)   Height:        Inpatient medications: . amLODipine  10 mg Oral BID  . aspirin EC  81 mg Oral Daily  . atorvastatin  40 mg Oral q1800  . Chlorhexidine Gluconate Cloth  6 each Topical Q0600  . cinacalcet  60 mg Oral Q breakfast  . cloNIDine  0.3 mg Oral TID  . docusate sodium  100 mg Oral Daily  . ferric citrate  420 mg Oral TID WC  . gabapentin  300 mg Oral TID  . hydrALAZINE  100 mg Oral Q8H  . insulin aspart  0-15 Units Subcutaneous TID WC  . isosorbide mononitrate  120 mg Oral QHS  . labetalol  300 mg Oral TID  . morphine   Intravenous Q4H  . multivitamin  1 tablet Oral QHS  . pantoprazole  40 mg Oral Daily  . sevelamer carbonate  2,400 mg Oral TID WC  . sodium chloride flush  3 mL Intravenous Q12H   . sodium chloride Stopped (12/18/2017 0514)  . sodium chloride    . piperacillin-tazobactam (ZOSYN)  IV 3.375 g (12/18/17 2120)  . [START ON 12/22/2017] vancomycin     sodium chloride, sodium chloride, acetaminophen **OR** acetaminophen, alteplase, alum & mag hydroxide-simeth, diphenhydrAMINE **OR** diphenhydrAMINE, guaiFENesin-dextromethorphan, heparin, hydrALAZINE, labetalol, lidocaine (PF), lidocaine-prilocaine, LORazepam, metoprolol tartrate, naloxone **AND** sodium chloride flush, ondansetron (ZOFRAN) IV, oxyCODONE, pentafluoroprop-tetrafluoroeth, phenol, sodium chloride flush  Iron/TIBC/Ferritin/ %Sat    Component Value Date/Time   IRON 26 (L) 06/23/2017 0708   TIBC 113 (L) 06/23/2017 0708   FERRITIN 1,050 (H) 06/23/2017 0708   IRONPCTSAT 23 06/23/2017 0708    Exam: nad chron ill appearing female  no jvd  chest cta bilat  cor reg no mrg  abd +diffuse nontense ascites  nontender +bs  ext no sig LE edema, R BKA  NF, ox 3  LUa AVF +bruit  Dialysis: MWF [facility]  4h 43min  80kg   2/2.5 bath  LUA AVF  Hep none - mircera 225 mcg q2wks - last 12/12/17 - hectorol 73mcg IV qHD        Impression: 1 PAD / L foot gangrene - sp revasc / toe amps per surg 2 I&D R BKA 3 ESRD mwf HD 4 Vol excess - chronic issue; up 8kg, plan UF 5L as tol today 5 Ascites - requires intermittent paracentesis 6 Hyperkalemia - better 7 Anemia recently got esa, Hb down 8.2,  follow 8 CAD/ CM/ G3DD 9  HTN - BP's 120's, lower than usual, appropriately has not been getting most of the doses of her BP meds the past few days   Plan - as above   Kelly Splinter MD [facility] pager [phone]   12/19/2017, 11:12 AM   Recent Labs  Lab 12/09/2017 0309  12/20/2017 2111 12/18/17 0249 12/19/17 0947  NA 137   < > 138 142 138  K 5.3*   < > 6.1* 4.2 5.1  CL 95*   < > 97* 103 99  CO2 28   < > 24 23 25   GLUCOSE 181*   < >  118* 120* 177*  BUN 57*   < > 61* 30* 49*  CREATININE 6.64*   < > 7.14* 4.49* 6.90*  CALCIUM 8.5*   < > 8.6* 8.4* 8.4*  PHOS  --   --  9.0*  --  9.3*  ALBUMIN  --   --  2.1*  --  1.8*  INR 1.15  --   --   --   --    < > = values in this interval not displayed.   No results for input(s): AST, ALT, ALKPHOS, BILITOT, PROT in the last 168 hours. Recent Labs  Lab 12/18/17 0249 12/19/17 0947  WBC 8.6 12.6*  HGB 9.3* 8.2*  HCT 29.7* 27.1*  MCV 92.0 96.4  PLT 196 194

## 2017-12-20 LAB — GLUCOSE, CAPILLARY
GLUCOSE-CAPILLARY: 161 mg/dL — AB (ref 70–99)
Glucose-Capillary: 219 mg/dL — ABNORMAL HIGH (ref 70–99)
Glucose-Capillary: 158 mg/dL — ABNORMAL HIGH (ref 70–99)
Glucose-Capillary: 167 mg/dL — ABNORMAL HIGH (ref 70–99)

## 2017-12-20 MED ORDER — CHLORHEXIDINE GLUCONATE CLOTH 2 % EX PADS
6.0000 | MEDICATED_PAD | Freq: Every day | CUTANEOUS | Status: DC
  Administered 2017-12-20: 6 via TOPICAL

## 2017-12-20 MED ORDER — VANCOMYCIN HCL IN DEXTROSE 1-5 GM/200ML-% IV SOLN
1000.0000 mg | INTRAVENOUS | Status: DC
  Filled 2017-12-20: qty 200

## 2017-12-20 NOTE — Progress Notes (Addendum)
[facility]  Subjective  - More comfortable today than yesterday.  Objective 118/70 81 99.8 F (37.7 C) (Oral) 19 100%  Intake/Output Summary (Last 24 hours) at 12/20/2017 0937 Last data filed at 12/20/2017 0116 Gross per 24 hour  Intake 67.67 ml  Output 4512 ml  Net -4444.33 ml    Right BKA dressing changed.  Wound base without frank infection.  Wet to dry dressing replaced. Right foot dressing clean and dry.  Right groin soft without hematoma.  Praveena vac in place to suction.   Heart RRR Lungs non labored breathing Gen NAD     Assessment/Planning: POD # 2 Procedure:   #1: Endarterectomy of the left distal external iliac, common femoral, profundofemoral, and superficial femoral artery with bovine pericardial patch angioplasty                         #2: Stent, left external iliac artery                         #3: Abdominal and iliac artery angiogram                         #4: Left lower extremity angiogram                         #5: Stent, left superficial femoral artery                         #6: Amputation including metatarsal head left second and third toes                         #7: Incision and drainage including skin and subcutaneous tissue, right below-knee amputation                         #8: Placement of Praveena wound VAC to the left femoral incision  She has a lot of discomfort with dressing changes and moving her body.  The right BKA dressing was changed today.  I will plan on changing the left foot dressing tomorrow for examination of the amputation sites.  Continue current treat ment plan.    Continue IV Zosyn and Vanc Specimen Description WOUND   Special Requests BKA RIGHT KNEE SWAB OF SOFT TISSUE   Gram Stain MODERATE WBC PRESENT,BOTH PMN AND MONONUCLEAR  FEW GRAM POSITIVE COCCI  FEW GRAM VARIABLE ROD         Roxy Horseman 12/20/2017 9:37 AM --  Laboratory Lab Results: Recent Labs     12/18/17 0249 12/19/17 0947  WBC 8.6 12.6*  HGB 9.3* 8.2*  HCT 29.7* 27.1*  PLT 196 194   BMET Recent Labs    12/18/17 0249 12/19/17 0947  NA 142 138  K 4.2 5.1  CL 103 99  CO2 23 25  GLUCOSE 120* 177*  BUN 30* 49*  CREATININE 4.49* 6.90*  CALCIUM 8.4* 8.4*    COAG Lab Results  Component Value Date   INR 1.15 12/04/2017   INR 1.14 06/17/2017   INR 1.20 06/11/2017   No results found for: PTT  I have interviewed the patient and examined the patient. I agree with the findings by the PA. I examined her room right below the knee amputation.  This has a small  amount of necrotic tissue but is otherwise fairly clean. The dressing on the left foot was just changed.  We will examine this tomorrow.  Gae Gallop, MD [phone]

## 2017-12-20 NOTE — Progress Notes (Signed)
[facility] Progress Note  Subjective: feeling better, pain better control and swelling down, 4.5L off w hd yest  Vitals:   12/19/17 2100 12/20/17 0000 12/20/17 0400 12/20/17 0413  BP: 135/75 128/69 118/70   Pulse:      Resp:  19  19  Temp: (!) 100.8 F (38.2 C) 99.8 F (37.7 C)    TempSrc: Oral Oral    SpO2: 98% 97%  100%  Weight:      Height:        Inpatient medications: . amLODipine  10 mg Oral BID  . aspirin EC  81 mg Oral Daily  . atorvastatin  40 mg Oral q1800  . Chlorhexidine Gluconate Cloth  6 each Topical Q0600  . cinacalcet  60 mg Oral Q breakfast  . cloNIDine  0.3 mg Oral TID  . docusate sodium  100 mg Oral Daily  . ferric citrate  420 mg Oral TID WC  . gabapentin  300 mg Oral TID  . hydrALAZINE  100 mg Oral Q8H  . insulin aspart  0-15 Units Subcutaneous TID WC  . isosorbide mononitrate  120 mg Oral QHS  . labetalol  300 mg Oral TID  . morphine   Intravenous Q4H  . multivitamin  1 tablet Oral QHS  . pantoprazole  40 mg Oral Daily  . sevelamer carbonate  2,400 mg Oral TID WC  . sodium chloride flush  3 mL Intravenous Q12H   . sodium chloride Stopped (12/12/2017 0514)  . sodium chloride    . piperacillin-tazobactam (ZOSYN)  IV 3.375 g (12/19/17 2116)  . [START ON 12/22/2017] vancomycin     sodium chloride, sodium chloride, acetaminophen **OR** acetaminophen, alum & mag hydroxide-simeth, diphenhydrAMINE **OR** diphenhydrAMINE, guaiFENesin-dextromethorphan, hydrALAZINE, labetalol, LORazepam, metoprolol tartrate, naloxone **AND** sodium chloride flush, ondansetron (ZOFRAN) IV, oxyCODONE, phenol, sodium chloride flush  Iron/TIBC/Ferritin/ %Sat    Component Value Date/Time   IRON 26 (L) 06/23/2017 0708   TIBC 113 (L) 06/23/2017 0708   FERRITIN 1,050 (H) 06/23/2017 0708   IRONPCTSAT 23 06/23/2017 0708    Exam: nad chron ill appearing female, looks much better today  no jvd  chest cta bilat  cor reg no mrg  abd +diffuse nontense ascites  nontender +bs  ext no sig LE edema, R BKA  NF, ox 3  LUa AVF +bruit  Dialysis: MWF [facility]  4h 70min  80kg   2/2.5 bath  LUA AVF  Hep none - mircera 225 mcg q2wks - last 12/12/17 - hectorol 17mcg IV qHD        Impression: 1 PAD / L foot gangrene - sp revasc / toe amps; on PCA, per surg 2 I&D R BKA 3 ESRD MWF HD.  Plan extra HD today to get vol down further 4 Vol excess - up 5kg today, recommend extra HD to get near edw pt agrees 5 Ascites - requires intermittent paracentesis 6 Anemia recently got esa, Hb down 8.2,  follow 7 CAD/ CM/ G3DD 8  HTN - BP's normal on multiple meds   Plan - as above   Kelly Splinter MD [place] pager [phone]   12/20/2017, 8:09 AM   Recent Labs  Lab 12/01/2017 0309  11/28/2017 2111 12/18/17 0249 12/19/17 0947  NA 137   < > 138 142 138  K 5.3*   < > 6.1* 4.2 5.1  CL 95*   < > 97* 103 99  CO2 28   < > 24 23 25   GLUCOSE 181*   < >  118* 120* 177*  BUN 57*   < > 61* 30* 49*  CREATININE 6.64*   < > 7.14* 4.49* 6.90*  CALCIUM 8.5*   < > 8.6* 8.4* 8.4*  PHOS  --   --  9.0*  --  9.3*  ALBUMIN  --   --  2.1*  --  1.8*  INR 1.15  --   --   --   --    < > = values in this interval not displayed.   No results for input(s): AST, ALT, ALKPHOS, BILITOT, PROT in the last 168 hours. Recent Labs  Lab 12/18/17 0249 12/19/17 0947  WBC 8.6 12.6*  HGB 9.3* 8.2*  HCT 29.7* 27.1*  MCV 92.0 96.4  PLT 196 194

## 2017-12-20 NOTE — Progress Notes (Signed)
Occupational Therapy Treatment Patient Details Name: [name] MRN: [id number] DOB: [birth year] Today's Date: 12/20/2017    History of present illness [name] is a 42 y.o. female. ESRD 2/2 DM on HD MWF at [facility], first starting in 07/2010.  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 Trula Slade, and ultimately R BKA on 06/17/17 Dr. Trula Slade.   Patient was seen by [facility] in office for wound check where they found her R stump was no longer healing and looked concerning for infection as well as LLE PVD with gangrene.  Pt had right BKA I& D as well as left toes amputation as well as left endarterectomy.  Placed wound VAC on left groin.    OT comments  Pt reporting significant pain in lower back and buttocks this session. She was agreeable to participation in functional activities at EOB to challenge balance as well as ADL at EOB. Pt continues to decline OOB mobility due to pain at buttocks. She was unable to bend over to pick up items accidentally dropped to the floor and discussed potential use of reacher to assist with this. She was able to complete grooming tasks at EOB with supervision to min guard assist and simulated LB dressing tasks with mod assist. OT will continue to follow while admitted. If pt unable to progress well, may need to consider short-term post-acute rehabilitation.     Follow Up Recommendations  Home health OT;Supervision/Assistance - 24 hour    Equipment Recommendations  None recommended by OT    Recommendations for Other Services      Precautions / Restrictions Precautions Precautions: Fall Restrictions Weight Bearing Restrictions: Yes LLE Weight Bearing: Weight bearing as tolerated       Mobility Bed Mobility Overal bed mobility:  Needs Assistance Bed Mobility: Rolling;Sidelying to Sit Rolling: Supervision Sidelying to sit: Mod assist       General bed mobility comments: Assist to power up trunk from [facility].   Transfers                 General transfer comment: Pt continued to decline OOB mobility to recliner due to sore on back side.     Balance Overall balance assessment: Needs assistance Sitting-balance support: Bilateral upper extremity supported;Feet supported Sitting balance-Leahy Scale: Fair Sitting balance - Comments: Supervision to min guard assist throughout dynamic tasks.                                    ADL either performed or assessed with clinical judgement   ADL Overall ADL's : Needs assistance/impaired Eating/Feeding: Sitting;Supervision/ safety Eating/Feeding Details (indicate cue type and reason): Superivision for balance at EOB Grooming: Sitting;Supervision/safety Grooming Details (indicate cue type and reason): Supervision for balance at EOB.  Upper Body Bathing: Set up;Sitting   Lower Body Bathing: Moderate assistance;Sitting/lateral leans   Upper Body Dressing : Set up;Sitting   Lower Body Dressing: Moderate assistance;Sitting/lateral leans                 General ADL Comments: Unable to progress to OOB mobility due to significant pain at pressure sore on buttocks. Able to sit at EOB to complete dynamic reaching and ADL tasks.  Vision       Perception     Praxis      Cognition Arousal/Alertness: Lethargic;Suspect due to medications Behavior During Therapy: [facility] for tasks assessed/performed Overall Cognitive Status: Within Functional Limits for tasks assessed                                          Exercises     Shoulder Instructions       General Comments wound VAC L groin    Pertinent Vitals/ Pain       Pain Assessment: 0-10 Pain Score: 7  Pain Location: R BK amputation Pain Descriptors / Indicators:  Aching;Grimacing;Guarding Pain Intervention(s): Limited activity within patient's tolerance;Monitored during session;Repositioned  Home Living                                          Prior Functioning/Environment              Frequency  Min 2X/week        Progress Toward Goals  OT Goals(current goals can now be found in the care plan section)  Progress towards OT goals: Progressing toward goals  Acute Rehab OT Goals Patient Stated Goal: decrease pain OT Goal Formulation: With patient Time For Goal Achievement: 01/01/18 Potential to Achieve Goals: Good ADL Goals Pt Will Perform Lower Body Bathing: with supervision;sitting/lateral leans Pt Will Perform Lower Body Dressing: with supervision;sitting/lateral leans Pt Will Transfer to Toilet: with supervision;with transfer board;bedside commode Pt Will Perform Toileting - Clothing Manipulation and hygiene: with supervision;sitting/lateral leans  Plan Discharge plan remains appropriate    Co-evaluation                 AM-PAC PT "6 Clicks" Daily Activity     Outcome Measure   Help from another person eating meals?: A Little Help from another person taking care of personal grooming?: A Little Help from another person toileting, which includes using toliet, bedpan, or urinal?: A Lot Help from another person bathing (including washing, rinsing, drying)?: A Lot Help from another person to put on and taking off regular upper body clothing?: A Lot Help from another person to put on and taking off regular lower body clothing?: A Lot 6 Click Score: 14    End of Session Equipment Utilized During Treatment: Oxygen  OT Visit Diagnosis: Other abnormalities of gait and mobility (R26.89);Pain Pain - Right/Left: Right Pain - part of body: Leg   Activity Tolerance Patient limited by pain   Patient Left in bed;with call bell/phone within reach   Nurse Communication Other (comment)(pt educated on need to  sit at EOB for meals or in chair)        Time: 1417-1440 OT Time Calculation (min): 23 min  Charges: OT General Charges $OT Visit: 1 Visit OT Treatments $Self Care/Home Management : 8-22 mins $Therapeutic Activity: 8-22 mins  Norman Herrlich, MS OTR/L  Pager: [address] 12/20/2017, 2:54 PM

## 2017-12-20 NOTE — Progress Notes (Signed)
Per Dr. Jonnie Finner HD cancelled for today.

## 2017-12-20 NOTE — Progress Notes (Addendum)
Pharmacy Antibiotic Note  [name] is a 42 y.o. female admitted on 12/17/2017 with gangrenous changes to L 2nd, 3rd toes.  Pharmacy has been consulted for Vancomycin and Zosyn dosing.  Pt has ESRD and dialyzes on MWF outpatient.  Last HD on 7/26.   Patient received a 3 gram vancomycin dose followed by HD on 7/24. Patient will need to be redosed with Vancomycin with planned HD today.   Pre-HD goal: 15-25  WBC increase to 12.6, Tmax 38.2  Plan: Zosyn 3.375 gm IV q12h (4 hour infusion). Vancomycin 1g with HD. Will dose today with HD.  Monitor cx, HD schedule and patient status.    Height: 6\' 4"  (193 cm) Weight: 187 lb 9.8 oz (85.1 kg) IBW/kg (Calculated) : 82.3  Temp (24hrs), Avg:100 F (37.8 C), Min:99.6 F (37.6 C), Max:100.8 F (38.2 C)   12/20/2017   1:24 PM Recent Labs  Lab 11/24/2017 0309 12/15/2017 1935 11/29/2017 2111 12/18/17 0249 12/19/17 0947  WBC 11.1*  --  12.5* 8.6 12.6*  CREATININE 6.64* 7.02* 7.14* 4.49* 6.90*    Estimated Creatinine Clearance: 13.9 mL/min (A) (by C-G formula based on SCr of 6.9 mg/dL (H)).    Allergies  Allergen Reactions  . Cephalexin Other (See Comments)    Reaction unknown- Childhood allergy Tolerated Ceftriaxone, ceftazidime, zosyn, augmentin in the past  . Sulfamethoxazole-Trimethoprim Other (See Comments)    Unknown reaction. Pt states that she was told by her mother that she had allergy to Bactrim as a child.    Antimicrobials this admission: Vanc 7/24 >> Zosyn 7/23 >>   Thank you for allowing pharmacy to be a part of this patient's care.  Gwenlyn Found, Sherian Rein D PGY1 Pharmacy Resident  Phone [phone] 12/20/2017   1:24 PM

## 2017-12-21 ENCOUNTER — Inpatient Hospital Stay (HOSPITAL_COMMUNITY): Payer: Medicare Other | Admitting: Certified Registered Nurse Anesthetist

## 2017-12-21 ENCOUNTER — Inpatient Hospital Stay (HOSPITAL_COMMUNITY): Payer: Medicare Other

## 2017-12-21 DIAGNOSIS — I469 Cardiac arrest, cause unspecified: Secondary | ICD-10-CM

## 2017-12-21 LAB — COMPREHENSIVE METABOLIC PANEL
ALBUMIN: 1.5 g/dL — AB (ref 3.5–5.0)
ALK PHOS: 157 U/L — AB (ref 38–126)
ALT: 250 U/L — ABNORMAL HIGH (ref 0–44)
ANION GAP: 25 — AB (ref 5–15)
AST: 590 U/L — AB (ref 15–41)
BILIRUBIN TOTAL: 1.1 mg/dL (ref 0.3–1.2)
BUN: 39 mg/dL — AB (ref 6–20)
CALCIUM: 12.4 mg/dL — AB (ref 8.9–10.3)
CO2: 19 mmol/L — ABNORMAL LOW (ref 22–32)
Chloride: 98 mmol/L (ref 98–111)
Creatinine, Ser: 6.65 mg/dL — ABNORMAL HIGH (ref 0.44–1.00)
GFR calc Af Amer: 8 mL/min — ABNORMAL LOW (ref >60)
GFR, EST NON AFRICAN AMERICAN: 7 mL/min — AB (ref >60)
GLUCOSE: 263 mg/dL — AB (ref 70–99)
POTASSIUM: 7.2 mmol/L — AB (ref 3.5–5.1)
Sodium: 142 mmol/L (ref 135–145)
TOTAL PROTEIN: 5.2 g/dL — AB (ref 6.5–8.1)

## 2017-12-21 LAB — CBC
HEMATOCRIT: 23.2 % — AB (ref 36.0–46.0)
HEMATOCRIT: 36 % (ref 36.0–46.0)
HEMOGLOBIN: 10 g/dL — AB (ref 12.0–15.0)
Hemoglobin: 6.9 g/dL — CL (ref 12.0–15.0)
MCH: 29.4 pg (ref 26.0–34.0)
MCH: 28.9 pg (ref 26.0–34.0)
MCHC: 29.7 g/dL — ABNORMAL LOW (ref 30.0–36.0)
MCHC: 27.8 g/dL — AB (ref 30.0–36.0)
MCV: 98.7 fL (ref 78.0–100.0)
MCV: 104 fL — AB (ref 78.0–100.0)
PLATELETS: 124 10*3/uL — AB (ref 150–400)
Platelets: 185 10*3/uL (ref 150–400)
RBC: 2.35 MIL/uL — AB (ref 3.87–5.11)
RBC: 3.46 MIL/uL — AB (ref 3.87–5.11)
RDW: 18 % — ABNORMAL HIGH (ref 11.5–15.5)
RDW: 18.2 % — AB (ref 11.5–15.5)
WBC: 13.9 10*3/uL — AB (ref 4.0–10.5)
WBC: 22.5 10*3/uL — ABNORMAL HIGH (ref 4.0–10.5)

## 2017-12-21 LAB — POCT I-STAT, CHEM 8
BUN: 47 mg/dL — ABNORMAL HIGH (ref 6–20)
CALCIUM ION: 1.59 mmol/L — AB (ref 1.15–1.40)
CHLORIDE: 102 mmol/L (ref 98–111)
CREATININE: 6.3 mg/dL — AB (ref 0.44–1.00)
GLUCOSE: 237 mg/dL — AB (ref 70–99)
HCT: 29 % — ABNORMAL LOW (ref 36.0–46.0)
Hemoglobin: 9.9 g/dL — ABNORMAL LOW (ref 12.0–15.0)
POTASSIUM: 6.8 mmol/L — AB (ref 3.5–5.1)
SODIUM: 137 mmol/L (ref 135–145)
TCO2: 23 mmol/L (ref 22–32)

## 2017-12-21 LAB — POCT I-STAT 3, ART BLOOD GAS (G3+)
ACID-BASE DEFICIT: 7 mmol/L — AB (ref 0.0–2.0)
BICARBONATE: 21.9 mmol/L (ref 20.0–28.0)
O2 SAT: 75 %
PCO2 ART: 64.4 mmHg — AB (ref 32.0–48.0)
PO2 ART: 53 mmHg — AB (ref 83.0–108.0)
Patient temperature: 98.7
TCO2: 24 mmol/L (ref 22–32)
pH, Arterial: 7.14 — CL (ref 7.350–7.450)

## 2017-12-21 LAB — APTT: aPTT: 34 seconds (ref 24–36)

## 2017-12-21 LAB — GLUCOSE, CAPILLARY: Glucose-Capillary: 164 mg/dL — ABNORMAL HIGH (ref 70–99)

## 2017-12-21 LAB — PROTIME-INR
INR: 1.67
PROTHROMBIN TIME: 19.5 s — AB (ref 11.4–15.2)

## 2017-12-21 LAB — LACTIC ACID, PLASMA: Lactic Acid, Venous: 3.5 mmol/L (ref 0.5–1.9)

## 2017-12-21 MED ORDER — MORPHINE SULFATE 2 MG/ML IV SOLN
INTRAVENOUS | Status: DC
  Administered 2017-12-21: 3 mg via INTRAVENOUS
  Administered 2017-12-21: 02:00:00 via INTRAVENOUS
  Filled 2017-12-21: qty 25

## 2017-12-21 MED ORDER — EPINEPHRINE PF 1 MG/10ML IJ SOSY
PREFILLED_SYRINGE | INTRAMUSCULAR | Status: DC
Start: 2017-12-21 — End: 2017-12-21
  Filled 2017-12-21: qty 30

## 2017-12-21 MED ORDER — SODIUM BICARBONATE 8.4 % IV SOLN
INTRAVENOUS | Status: AC
  Filled 2017-12-21: qty 50

## 2017-12-21 MED ORDER — CHLORHEXIDINE GLUCONATE CLOTH 2 % EX PADS: 6.0000 | MEDICATED_PAD | Freq: Every day | CUTANEOUS | Status: DC

## 2017-12-21 MED ORDER — DEXTROSE 50 % IV SOLN
INTRAVENOUS | Status: AC
  Filled 2017-12-21: qty 50

## 2017-12-21 MED ORDER — EPINEPHRINE PF 1 MG/ML IJ SOLN
0.5000 ug/min | INTRAMUSCULAR | Status: DC
  Filled 2017-12-21: qty 4

## 2017-12-21 MED ORDER — CALCIUM CHLORIDE 10 % IV SOLN
INTRAVENOUS | Status: AC
  Filled 2017-12-21: qty 10

## 2017-12-21 MED FILL — Medication: Qty: 1 | Status: AC

## 2017-12-22 LAB — AEROBIC/ANAEROBIC CULTURE W GRAM STAIN (SURGICAL/DEEP WOUND)

## 2017-12-22 LAB — AEROBIC/ANAEROBIC CULTURE (SURGICAL/DEEP WOUND)

## 2017-12-24 LAB — CULTURE, BLOOD (ROUTINE X 2)
Culture: NO GROWTH
Culture: NO GROWTH
SPECIAL REQUESTS: ADEQUATE
Special Requests: ADEQUATE

## 2017-12-25 NOTE — Procedures (Signed)
Trialysis Catheter Insertion Procedure Note [name] [id number] [birth year]  Procedure: Insertion of Trialysis Catheter Indications: Dialysis  Procedure Details Consent: Unable to obtain consent because of emergent medical necessity. Time Out: Verified patient identification, verified procedure, site/side was marked, verified correct patient position, special equipment/implants available, medications/allergies/relevent history reviewed, required imaging and test results available.  Performed  Maximum sterile technique was used including antiseptics, cap, gloves, gown, hand hygiene, mask and sheet. Skin prep: Chlorhexidine; local anesthetic administered A antimicrobial bonded/coated triple lumen catheter was placed in the right femoral vein due to patient being a dialysis patient using the Seldinger technique.  Evaluation Blood flow good Complications: No apparent complications Patient did tolerate procedure well.  Glori Bickers MD 01/11/18, 12:25 PM

## 2017-12-25 NOTE — Progress Notes (Signed)
Pt to 08/23/22. Immediately into VF, ACLS protocol followed- see code sheet. Multiple episodes of ROSC with subsequent loss of pulse. Drs Doren Custard, Bensimhon, and Hilton at the bedside t/o. Time of death 1215/08/23. Family notified, now at bedside. Time for questions and therapeutic conversation provided. CDS notified.

## 2017-12-25 NOTE — Progress Notes (Signed)
Critical result received from Lab Hgb 6.9 Lactic 3.5  Reported to Dr Scot Dock

## 2017-12-25 NOTE — Progress Notes (Signed)
Progress note  Code Blue continued for another 45-60 minutes under Dr. Scherrie Merritts leadership. I continued to assist.   Labs back with marked acidosis and hyperkalemia.   VVS indicated that she previously responded to CVVHD.   I placed emergent Trialysis catheter in right femoral vein.   However patient unable to maintain circulation despite all efforts.   Code called at 1217p by Dr. Debbora Dus. He and I discussed with family. VVS also present at bedside.   Additional 35 CCT not including line placement.   Glori Bickers, MD  12:31 PM

## 2017-12-25 NOTE — Anesthesia Procedure Notes (Signed)
Procedure Name: Intubation Date/Time: Dec 24, 2017 10:50 AM Performed by: Clearnce Sorrel, CRNA Pre-anesthesia Checklist: Patient identified, Emergency Drugs available, Suction available and Patient being monitored Preoxygenation: Pre-oxygenation with 100% oxygen Laryngoscope Size: Glidescope and 4 Grade View: Grade I Tube type: Subglottic suction tube Tube size: 7.5 mm Number of attempts: 1 Airway Equipment and Method: Stylet Placement Confirmation: ETT inserted through vocal cords under direct vision,  positive ETCO2 and breath sounds checked- equal and bilateral Secured at: 23 cm Tube secured with: Tape Dental Injury: Teeth and Oropharynx as per pre-operative assessment

## 2017-12-25 NOTE — Code Documentation (Signed)
CODE BLUE NOTE  Patient Name: [name]   MRN: [id number]   Date of Birth/ Sex: 1976/03/02 , female      Admission Date: 12/22/2017  Attending Provider: Serafina Mitchell, MD  Primary Diagnosis: <principal problem not specified>    Indication: Pt was in her usual state of health until this AM, when she was noted to be without a pulse. Code blue was subsequently called. At the time of arrival on scene, ACLS protocol was underway.    Technical Description:  - CPR performance duration:  1 hour  - Was defibrillation or cardioversion used? Yes   - Was external pacer placed? No  - Was patient intubated pre/post CPR? Yes    Medications Administered: Y = Yes; Blank = No Amiodarone  Y  Atropine    Calcium  Y  Epinephrine  Y  Lidocaine  Y  Magnesium  Y  Norepinephrine    Phenylephrine    Sodium bicarbonate  Y  Vasopressin      Post CPR evaluation:  - Final Status - Was patient successfully resuscitated ? Yes - What is current rhythm? sinus - What is current hemodynamic status? unstable   Miscellaneous Information:  - Labs sent, including: BMP  - Primary team notified?  Yes  - Family Notified? Yes  - Additional notes/ transfer status: Transferred from 4E to [place].        [place], DO  07-Jan-2018, 11:41 AM

## 2017-12-25 NOTE — Progress Notes (Signed)
[facility] Progress Note  Subjective: not able to dialyze extra HD yest due to large pt load.  Doing well today.   Vitals:   01-16-18 0229 2018-01-16 0450 2018-01-16 0451 2018/01/16 0728  BP:   123/71   Pulse:   82   Resp: 13   19  Temp:   99.1 F (37.3 C)   TempSrc:   Oral   SpO2: 99%  99% 96%  Weight:  86.3 kg (190 lb 4.1 oz)    Height:        Inpatient medications: . amLODipine  10 mg Oral BID  . aspirin EC  81 mg Oral Daily  . atorvastatin  40 mg Oral q1800  . Chlorhexidine Gluconate Cloth  6 each Topical Q0600  . cinacalcet  60 mg Oral Q breakfast  . cloNIDine  0.3 mg Oral TID  . docusate sodium  100 mg Oral Daily  . ferric citrate  420 mg Oral TID WC  . gabapentin  300 mg Oral TID  . hydrALAZINE  100 mg Oral Q8H  . insulin aspart  0-15 Units Subcutaneous TID WC  . isosorbide mononitrate  120 mg Oral QHS  . labetalol  300 mg Oral TID  . morphine   Intravenous Q4H  . multivitamin  1 tablet Oral QHS  . pantoprazole  40 mg Oral Daily  . sevelamer carbonate  2,400 mg Oral TID WC  . sodium chloride flush  3 mL Intravenous Q12H   . sodium chloride Stopped (12/19/2017 0514)  . sodium chloride    . piperacillin-tazobactam (ZOSYN)  IV 3.375 g (12/20/17 2222)  . [START ON 12/22/2017] vancomycin     sodium chloride, sodium chloride, acetaminophen **OR** acetaminophen, alum & mag hydroxide-simeth, diphenhydrAMINE **OR** diphenhydrAMINE, guaiFENesin-dextromethorphan, hydrALAZINE, labetalol, LORazepam, metoprolol tartrate, naloxone **AND** sodium chloride flush, ondansetron (ZOFRAN) IV, oxyCODONE, phenol, sodium chloride flush  Iron/TIBC/Ferritin/ %Sat    Component Value Date/Time   IRON 26 (L) 06/23/2017 0708   TIBC 113 (L) 06/23/2017 0708   FERRITIN 1,050 (H) 06/23/2017 0708   IRONPCTSAT 23 06/23/2017 0708    Exam: nad chron ill appearing female, looks much better today  no jvd  chest cta bilat  cor reg no mrg  abd +diffuse nontense ascites nontender +bs  ext no sig LE edema, R BKA  NF, ox 3  LUa AVF +bruit  Dialysis: MWF [facility]  4h 56min  80kg   2/2.5 bath  LUA AVF  Hep none - mircera 225 mcg q2wks - last 12/12/17 - hectorol 2mcg IV qHD        Impression: 1 PAD / L foot gangrene - sp LLE surg revasc + toe amps; on PCA, per surg 2 SP I&D R BKA 3 ESRD MWF HD.  HD tomorrow, 5L NET.  4 Vol excess - as above, up 6kg today 5 Ascites - requires intermittent paracentesis as OP about q 2wks 6 Anemia recently got esa, Hb down 8.2,  follow 7 CAD/ CM/ G3DD 8  HTN - BP's on low side for this pt, due to PCA most likely, mult meds  Plan - as above   Kelly Splinter MD [place] pager [phone]   January 16, 2018, 9:49 AM   Recent Labs  Lab 12/06/2017 0309  12/15/2017 2111 12/18/17 0249 12/19/17 0947  NA 137   < > 138 142 138  K 5.3*   < > 6.1* 4.2 5.1  CL 95*   < > 97* 103 99  CO2 28   < >  24 23 25   GLUCOSE 181*   < > 118* 120* 177*  BUN 57*   < > 61* 30* 49*  CREATININE 6.64*   < > 7.14* 4.49* 6.90*  CALCIUM 8.5*   < > 8.6* 8.4* 8.4*  PHOS  --   --  9.0*  --  9.3*  ALBUMIN  --   --  2.1*  --  1.8*  INR 1.15  --   --   --   --    < > = values in this interval not displayed.   No results for input(s): AST, ALT, ALKPHOS, BILITOT, PROT in the last 168 hours. Recent Labs  Lab 12/18/17 0249 12/19/17 0947  WBC 8.6 12.6*  HGB 9.3* 8.2*  HCT 29.7* 27.1*  MCV 92.0 96.4  PLT 196 194

## 2017-12-25 NOTE — Code Documentation (Signed)
  Code Blue Note  42 y/o woman with severe PAD, ESRD, CAD. Admitted for wound I&D for non-healing ulcer  Was sitting up this am and became nauseated and vomitted. Then developed PEA arrest and Code Blue activated. Dr. Roxan Hockey first responder and I arrived almost immediately after. Rapid Response RN already at bedside. Dr. Scot Dock from [facility] also at bedside.  CPR begun immediately. Treated with multiple rounds of EPI, bicarb and calcium. Patient developed VT/VF and received multiple shocks. Given amio and lidocaine. Intubated at bedside by anesthesia CRNA. ROSC achieved several times but patient lost pulse quickly. Finally stabilized enough on EPI drip to be transferred to [facility].   On arrival to [facility] met by CCM team. Patient quickly developed recurrent asystole then VF. Shocked multiple times. Treated with more epi and bicarb. Given insulin and D50.   Care turned over to CCM. Dr. Debbora Dus. Dr. Virgina Jock (her primary cardiologist was notified)  CRITICAL CARE Performed by: Glori Bickers  Total critical care time: 60 minutes  Critical care time was exclusive of separately billable procedures and treating other patients.  Critical care was necessary to treat or prevent imminent or life-threatening deterioration.  Critical care was time spent personally by me (independent of midlevel providers or residents) on the following activities: development of treatment plan with patient and/or surrogate as well as nursing, discussions with consultants, evaluation of patient's response to treatment, examination of patient, obtaining history from patient or surrogate, ordering and performing treatments and interventions, ordering and review of laboratory studies, ordering and review of radiographic studies, pulse oximetry and re-evaluation of patient's condition.  Glori Bickers, MD  11:38 AM

## 2017-12-25 NOTE — Progress Notes (Signed)
[facility] [place]  Subjective  - Pain is somewhat controlled on IV Morphine PCA.   Objective 123/71 82 99.1 F (37.3 C) (Oral) 19 96% No intake or output data in the 24 hours ending 30-Dec-2017 0953  Left foot amputation site without purulence, base mixed red granulation, no frank dead tissue. Left groin with incisional vac in place Doppler peroneal left foot Right BKA dressing to be changed by nursing today wet to dry. Heart RRR Lungs non labored breathing, O2 monitor for PCA  Assessment/Planning: POD # 3 Procedure:#1: Endarterectomy of the left distal external iliac, common femoral, profundofemoral, and superficial femoral artery with bovine pericardial patch angioplasty #2: Stent, left external iliac artery #3: Abdominal and iliac artery angiogram #4: Left lower extremity angiogram #5: Stent, left superficial femoral artery #6: Amputation including metatarsal head left second and third toes #7: Incision and drainage including skin and subcutaneous tissue, right below-knee amputation #8: Placement of Praveena wound VAC to the left femoral incision  Alternate dressing changes right BKA and left foot secondary to discomfort.  Cont IV antibiotics Zosyn and Vanc Specimen Description WOUND   Special Requests BKA RIGHT KNEE SWAB OF SOFT TISSUE   Gram Stain MODERATE WBC PRESENT,BOTH PMN AND MONONUCLEAR  FEW GRAM POSITIVE COCCI  FEW GRAM VARIABLE ROD          [name] 12/30/2017 9:53 AM --  Laboratory Lab Results: Recent Labs    12/19/17 0947  WBC 12.6*  HGB 8.2*  HCT 27.1*  PLT 194   BMET Recent Labs    12/19/17 0947  NA 138  K 5.1  CL 99  CO2 25  GLUCOSE 177*  BUN 49*  CREATININE 6.90*  CALCIUM 8.4*    COAG Lab Results  Component Value Date   INR 1.15 12/03/2017   INR 1.14 06/17/2017   INR 1.20 06/11/2017   No results found for: PTT

## 2017-12-25 NOTE — Progress Notes (Signed)
PCA syringe replaced. Wasted 0.75 ml witnessed by Colletta Maryland CN.

## 2017-12-25 NOTE — Discharge Summary (Signed)
[facility] Discharge Summary   Patient ID:  [name] MRN: [id number] DOB/AGE: 42-Sep-1977 42 y.o.  Admit date: 11/30/2017 Discharge date: 01-01-18 Date of Surgery: 12/12/2017 Surgeon: Surgeon(s): Serafina Mitchell, MD  Admission Diagnosis: PAD (peripheral artery disease) [facility]) [I73.9]  Discharge Diagnoses:  PAD (peripheral artery disease) ([place]) [I73.9]  Secondary Diagnoses: Past Medical History:  Diagnosis Date  . Arthritis   . CHF (congestive heart failure) ([place])   . Chronic anemia    2nd to renal disease  . CIN III (cervical intraepithelial neoplasia grade III) with severe dysplasia    S/P LEEP AND CONE  . Coronary artery disease    Status post cardiac catheterization June 2012 scattered coronary artery disease/atherosclerosis with 70-80% stenosis in a small right PDA.  . Diabetic Charcot's joint disease ([place])   . DM (diabetes mellitus) ([place])    Long-term insulin  . End stage renal disease on dialysis [facility]) 05/02/11   "[facility]"; [facility]; M; W, F ("got it 04/15/2017 because of Thanksgiving")  . Fracture of 5th metatarsal 2016   Right  . Gastroparesis   . Heart murmur   . 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 History of abscesses in groins 12/06/2010  . Hyperlipidemia    Hypertriglyceridemia 449 HDL 25  . Hypertension   . Migraines    "just on dialysis days"  . Peripheral neuropathy    related to DM  . Peripheral vascular disease ([place])    Tibial occlusive disease evaluated by Dr. Kellie Simmering in August 2011. Medical therapy  . Renal insufficiency    Dialysis since 2012  . Seizures ([place])    "only when her sugar drops" (04/17/2017)  . Tobacco use disorder    Discontinued March 2012  . Trimalleolar fracture of ankle, closed 02/09/2015   Right    Procedure(s): LEFT SUPERFICIAL FEMORAL ARTERY ENDARTERECTOMY INSERTION OF LEFT FEMORAL ELUVIA STENT AMPUTATION TOE PATCH ANGIOPLASTY USING XENOSURE PATCH IRRIGATION AND DEBRIDEMENT OF RIGHT BELOW KNEE  AMPUTATION  Discharged Condition: deceased  HPI: 42 y/o female well known to our practice.  She was seen in follow up for right BKA stump with non healing incision.  During this exam it was discovered that she has developed dry gangrene changes to her 2nd and 3erd toes.  She denise fever and chills.  She was scheduled for aortogram with run off, possible intervention.    Findings:              Aortogram: No significant renal artery stenosis.  The infrarenal abdominal aorta is heavily calcified.  There is luminal narrowing that does not appear to be hemodynamically significant the midportion of the aorta.  Bilateral common and external iliac arteries are patent without stenosis but heavily calcified.             Right Lower Extremity:   The right common femoral and profundofemoral artery are widely patent as is the associated patch angioplasty repair.  There is diffuse disease throughout the superficial femoral artery which occludes at the level of the amputation site.             Left Lower Extremity: The left common femoral artery has a 90% stenosis at its distal extent.  The profundofemoral and superficial femoral artery are patent throughout the course however there is a high-grade stenosis in the superficial femoral artery at the adductor canal.  The tibial vessels are all patent but heavily calcified  Intervention: None  Impression:             #  1 no significant aortoiliac occlusive disease             #2 90% left common femoral artery stenosis             #3 high-grade left superficial femoral artery stenosis             #4 three-vessel runoff however poor opacification of the vessels out onto the foot   Based on discussion and angiogram Dr. Trula Slade scheduled her for left femoral endarterectomy and left superficial femoral artery stent tomorrow in the operating room.  Simultaneously I will perform amputation of the necessary digits.  I also discussed that I may debride her below-knee  amputation.       Hospital Course:  [name] is a 42 y.o. female is S/P  Procedure(s): LEFT SUPERFICIAL FEMORAL ARTERY ENDARTERECTOMY INSERTION OF LEFT FEMORAL ELUVIA STENT AMPUTATION TOE PATCH ANGIOPLASTY USING XENOSURE PATCH IRRIGATION AND DEBRIDEMENT OF RIGHT BELOW KNEE AMPUTATION  Consults:  Treatment Team:  Mauricia Area, MD Roney Jaffe, MD   Specimen Description WOUND   Special Requests BKA RIGHT KNEE SWAB OF SOFT TISSUE   Gram Stain MODERATE WBC PRESENT,BOTH PMN AND MONONUCLEAR  FEW GRAM POSITIVE COCCI  FEW GRAM VARIABLE ROD     IV antibiotics Zosyn currently Heparin for DVT prophylaxis HD was performed yesterday, M-W-F schedule.  Completion angiography was then performed which showed a widely patent superficial femoral artery and popliteal artery. Tibial vessels were unchanged. Runoff onto the foot was severely limited distal the pedal arch. Dr. Trula Slade discussed future needs could possibly be left transmet verse BKA.  Revision of right BKA higher verses AKA. Continue praveena wound vac for 7-10 days left groin. Plan to start dressing changes on the right BKA and left foot tomorrow. pod#2  Discussed continuing dressing changes.  She is at very high risk for additional amputations on both legs.  Will continue dressings until wounds declare themselves  Pod#3 When I walked into her room she had vomited her breakfast up and was hard to arouse.  Once alert she appeared herself and repositioned her self so that I could perform left foot dressings.  She tolerated this well.  At 10:37 am her nurse Audie Pinto was checking on her and found no pulse, HR 30's and called code blue.  Code documentation per Dr. Haroldine Laws: Code Blue Note  42 y/o woman with severe PAD, ESRD, CAD. Admitted for wound I&D for non-healing ulcer  Was sitting up this am and became nauseated and vomitted. Then developed PEA arrest and Code Blue activated. Dr. Roxan Hockey first  responder and I arrived almost immediately after. Rapid Response RN already at bedside. Dr. Scot Dock from [facility] also at bedside.  CPR begun immediately. Treated with multiple rounds of EPI, bicarb and calcium. Patient developed VT/VF and received multiple shocks. Given amio and lidocaine. Intubated at bedside by anesthesia CRNA. ROSC achieved several times but patient lost pulse quickly. Finally stabilized enough on EPI drip to be transferred to [facility].   On arrival to [facility] met by CCM team. Patient quickly developed recurrent asystole then VF. Shocked multiple times. Treated with more epi and bicarb. Given insulin and D50.   Care turned over to CCM. Dr. Debbora Dus. Dr. Virgina Jock (her primary cardiologist was notified)  However patient unable to maintain circulation despite all efforts.   Code called at 1217p by Dr. Debbora Dus. He and I discussed with family. [facility] also present at bedside.      Significant Diagnostic Studies: CBC Lab Results  Component Value Date   WBC 22.5 (H) December 27, 2017   HGB 10.0 (L) 27-Dec-2017   HCT 36.0 Dec 27, 2017   MCV 104.0 (H) 27-Dec-2017   PLT 185 27-Dec-2017    BMET    Component Value Date/Time   NA 142 27-Dec-2017 1200   K 7.2 (HH) December 27, 2017 1200   CL 98 12/27/2017 1200   CO2 19 (L) 12/27/2017 1200   GLUCOSE 263 (H) December 27, 2017 1200   BUN 39 (H) 27-Dec-2017 1200   CREATININE 6.65 (H) Dec 27, 2017 1200   CALCIUM 12.4 (H) 27-Dec-2017 1200   GFRNONAA 7 (L) Dec 27, 2017 1200   GFRAA 8 (L) 12-27-2017 1200   COAG Lab Results  Component Value Date   INR 1.67 2017-12-27   INR 1.15 12/03/2017   INR 1.14 06/17/2017     Disposition:  Discharge to :deceased  Allergies as of December 27, 2017      Reactions   Cephalexin Other (See Comments)   Reaction unknown- Childhood allergy Tolerated Ceftriaxone, ceftazidime, zosyn, augmentin in the past   Sulfamethoxazole-trimethoprim Other (See Comments)   Unknown reaction. Pt states that she was told by her mother that she had  allergy to Bactrim as a child.      Medication List    ASK your doctor about these medications   acetaminophen 500 MG tablet Commonly known as:  TYLENOL Take 1,000-1,500 mg by mouth every 6 (six) hours as needed for moderate pain or headache.   amLODipine 10 MG tablet Commonly known as:  NORVASC Take 1 tablet (10 mg total) by mouth daily after supper.   aspirin EC 81 MG tablet Take 81 mg by mouth daily.   atorvastatin 40 MG tablet Commonly known as:  LIPITOR Take 1 tablet (40 mg total) by mouth daily at 6 PM.   cinacalcet 60 MG tablet Commonly known as:  SENSIPAR TAKE 1 TABLET BY MOUTH DAILY WITH DINNER (DO NOT TAKE LESS THAN 12 HOURS PRIOR TO DIALYSIS)   cloNIDine 0.3 MG tablet Commonly known as:  CATAPRES Take 1 tablet (0.3 mg total) by mouth 3 (three) times daily.   collagenase ointment Commonly known as:  SANTYL Apply 1 application topically daily.   ferric citrate 1 GM 210 MG(Fe) tablet Commonly known as:  AURYXIA Take 2 tablets (420 mg total) by mouth 3 (three) times daily with meals.   gabapentin 100 MG capsule Commonly known as:  NEURONTIN Take 1 capsule (100 mg total) by mouth 2 (two) times daily.   HUMALOG KWIKPEN 100 UNIT/ML KiwkPen Generic drug:  insulin lispro Inject 8 units SQ 3 times daily with meals   hydrALAZINE 100 MG tablet Commonly known as:  APRESOLINE Take 1 tablet (100 mg total) by mouth every 8 (eight) hours.   Insulin Glargine 100 UNIT/ML Solostar Pen Commonly known as:  LANTUS SOLOSTAR Inject 20 Units into the skin daily at 10 pm.   isosorbide mononitrate 120 MG 24 hr tablet Commonly known as:  IMDUR Take 120 mg by mouth at bedtime.   labetalol 300 MG tablet Commonly known as:  NORMODYNE Take 1 tablet (300 mg total) by mouth 3 (three) times daily.   LORazepam 0.5 MG tablet Commonly known as:  ATIVAN Take 1 tablet (0.5 mg total) by mouth every 12 (twelve) hours as needed for anxiety.   metoCLOPramide 5 MG tablet Commonly known  as:  REGLAN Take 0.5 tablets (2.5 mg total) by mouth 3 (three) times daily before meals.   multivitamin Tabs tablet Take 1 tablet by mouth every evening.   pantoprazole  40 MG tablet Commonly known as:  PROTONIX Take 1 tablet (40 mg total) by mouth daily.   sertraline 50 MG tablet Commonly known as:  ZOLOFT Take 1 tablet (50 mg total) by mouth daily.   sevelamer carbonate 800 MG tablet Commonly known as:  RENVELA Take 3 tablets (2,400 mg total) by mouth 3 (three) times daily with meals. Give 3 tablets to = 2400 mg TID      Verbal and written Discharge instructions given to the patient. Wound care per Discharge AVS   Signed: Roxy Horseman 12/22/2017, 7:07 AM

## 2017-12-25 NOTE — Progress Notes (Signed)
VASCULAR SURGERY  Patient arrested at 10:40 AM. The code team was immediately present and Dr. Haroldine Laws ran the code for almost 2 hours. She have multiple arrests and came back briefly, but ultimately she could not be resuscitated. I have discussed the situation with the husband and family during the arrest and they are now at the bedside.   Deitra Mayo, MD, [place] [phone] Office: [phone]

## 2017-12-25 NOTE — Progress Notes (Signed)
23cc IV Morphine from PCA syringe wasted in sink, witnessed by Erasmo Downer, Therapist, sports

## 2017-12-25 NOTE — Progress Notes (Signed)
In patient's room, pt unarousable with agonal breathing, hr 30s with no palpable pulse, code blue called and cpr started, see code sheet

## 2017-12-25 DEATH — deceased

## 2017-12-29 ENCOUNTER — Ambulatory Visit: Payer: Medicare Other | Admitting: Surgery

## 2018-01-15 ENCOUNTER — Ambulatory Visit: Payer: Medicare Other | Admitting: Physical Medicine & Rehabilitation

## 2018-08-01 IMAGING — CR DG CHEST 1V PORT
1 series · 1 of 1 positions shown · non-contrast
Comparison: 04/25/2017

CLINICAL DATA: Respiratory failure

EXAM:
PORTABLE CHEST 1 VIEW

[AP]
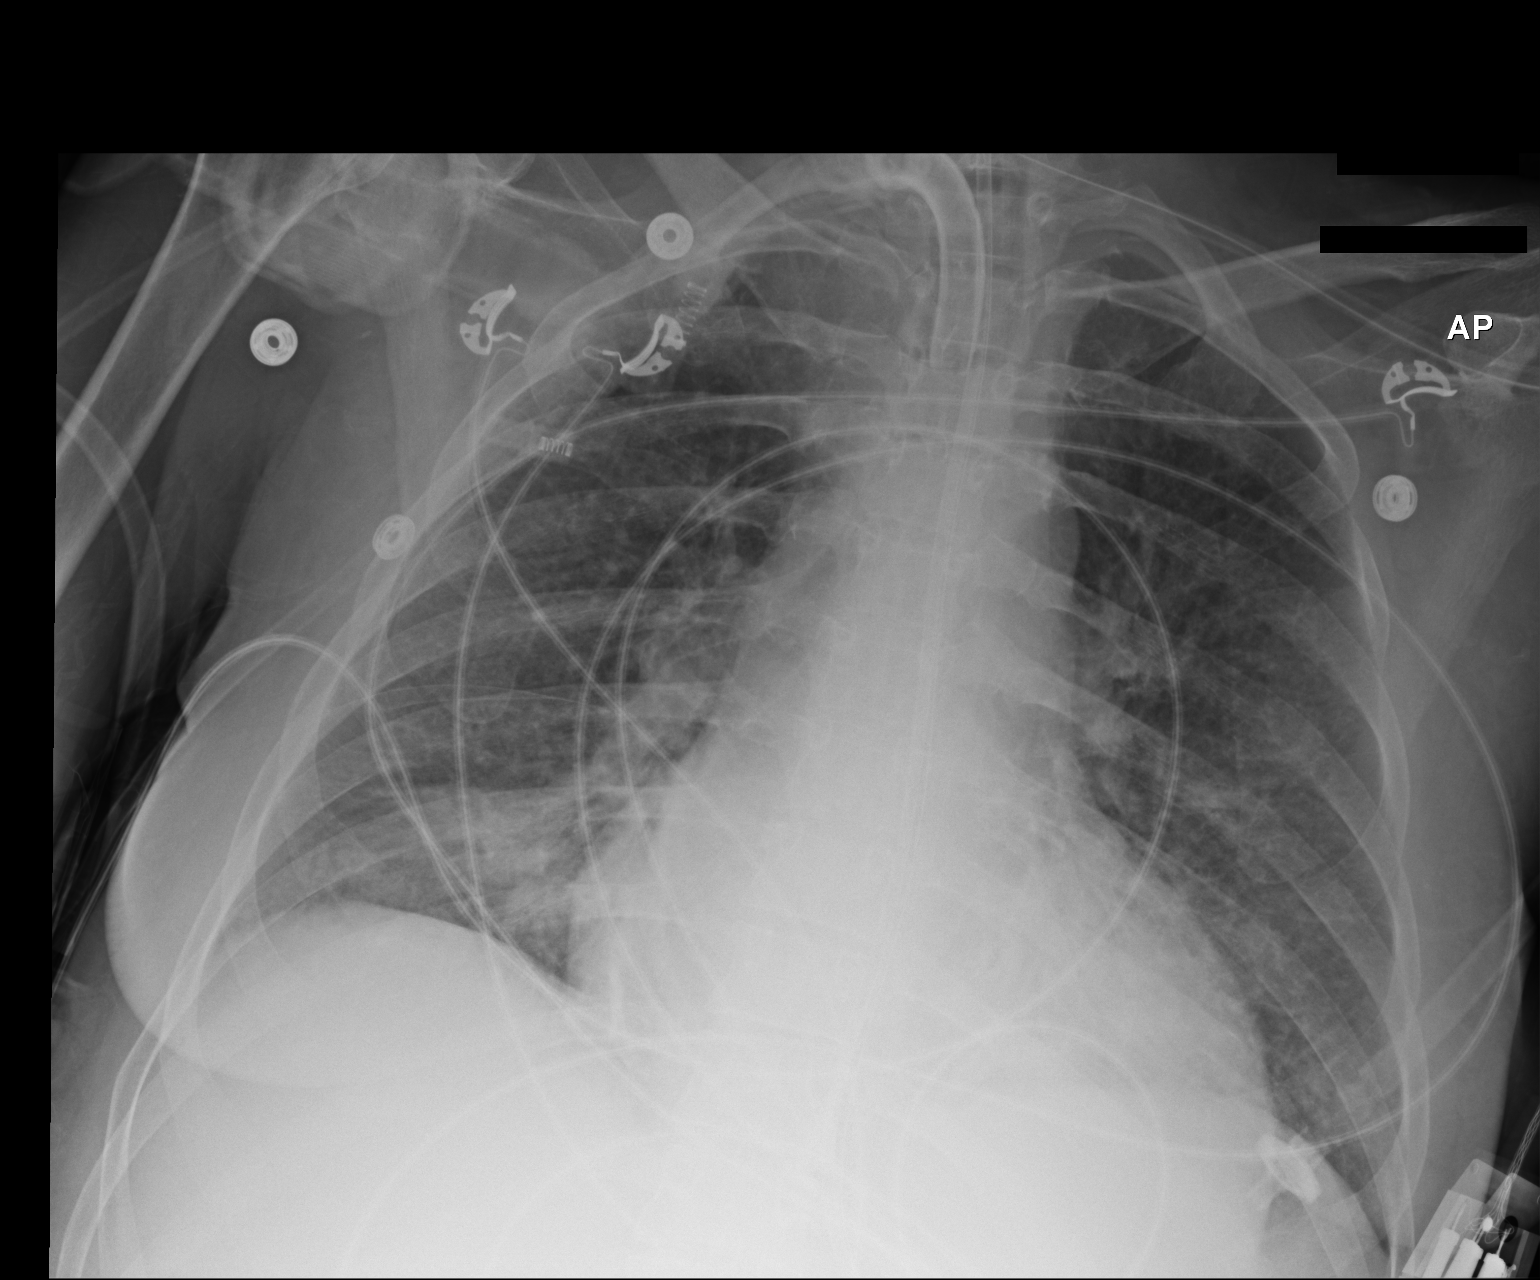

[1 of 1 positions shown; findings below may reference images not displayed]

FINDINGS: Tracheostomy and feeding tube remain in place. Cardiomegaly.
Bilateral lower lobe airspace opacities are again noted. No visible
effusions. Mild vascular congestion.
IMPRESSION: Cardiomegaly, vascular congestion.

Continued bibasilar atelectasis or infiltrates.

## 2018-08-05 IMAGING — DX DG ABD PORTABLE 1V
1 series · 1 of 1 positions shown · non-contrast
Comparison: Abdominal radiograph dated 04/29/2017

CLINICAL DATA: 41-year-old female status post NG placement.

EXAM:
PORTABLE ABDOMEN - 1 VIEW

[abdomen kub]
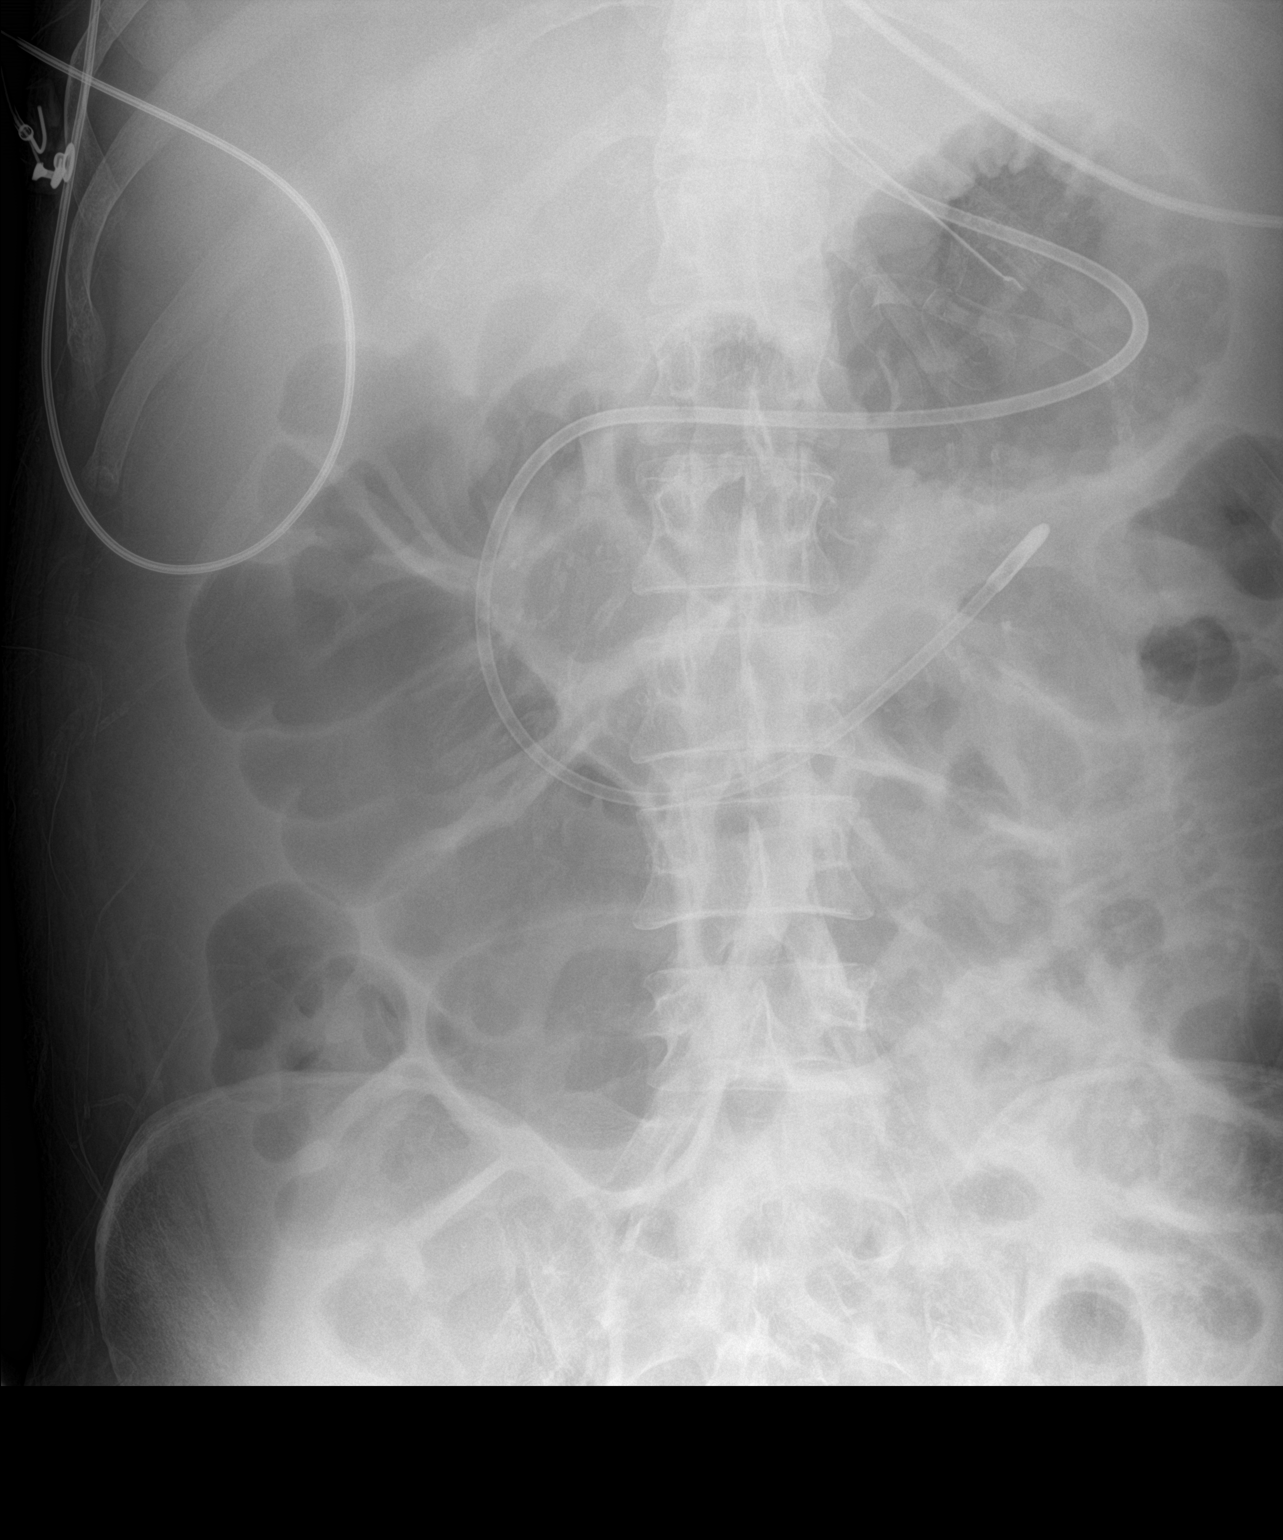

[1 of 1 positions shown; findings below may reference images not displayed]

FINDINGS: A feeding tube is seen in similar position as prior radiograph. NG
tube with tip in the body of the stomach and side-port likely in the
proximal stomach or region of the gastroesophageal junction.
Distended loops of air-filled small and large bowel similar to prior
radiograph. No free air noted on the provided image. The osseous
structures and soft tissues appear unremarkable. Vascular
calcifications noted.
IMPRESSION: No significant interval change in the positioning of the NG and
feeding tube or distended appearance of the small and large bowel.

## 2018-08-06 IMAGING — DX DG ABD PORTABLE 1V
1 series · 2 of 2 positions shown · non-contrast
Comparison: 04/30/2017

CLINICAL DATA: Abdominal pain.

EXAM:
PORTABLE ABDOMEN - 1 VIEW

[Series 1: abdomen · 0.14mm/px · 2 of 2 slices shown]
[im 1/2]
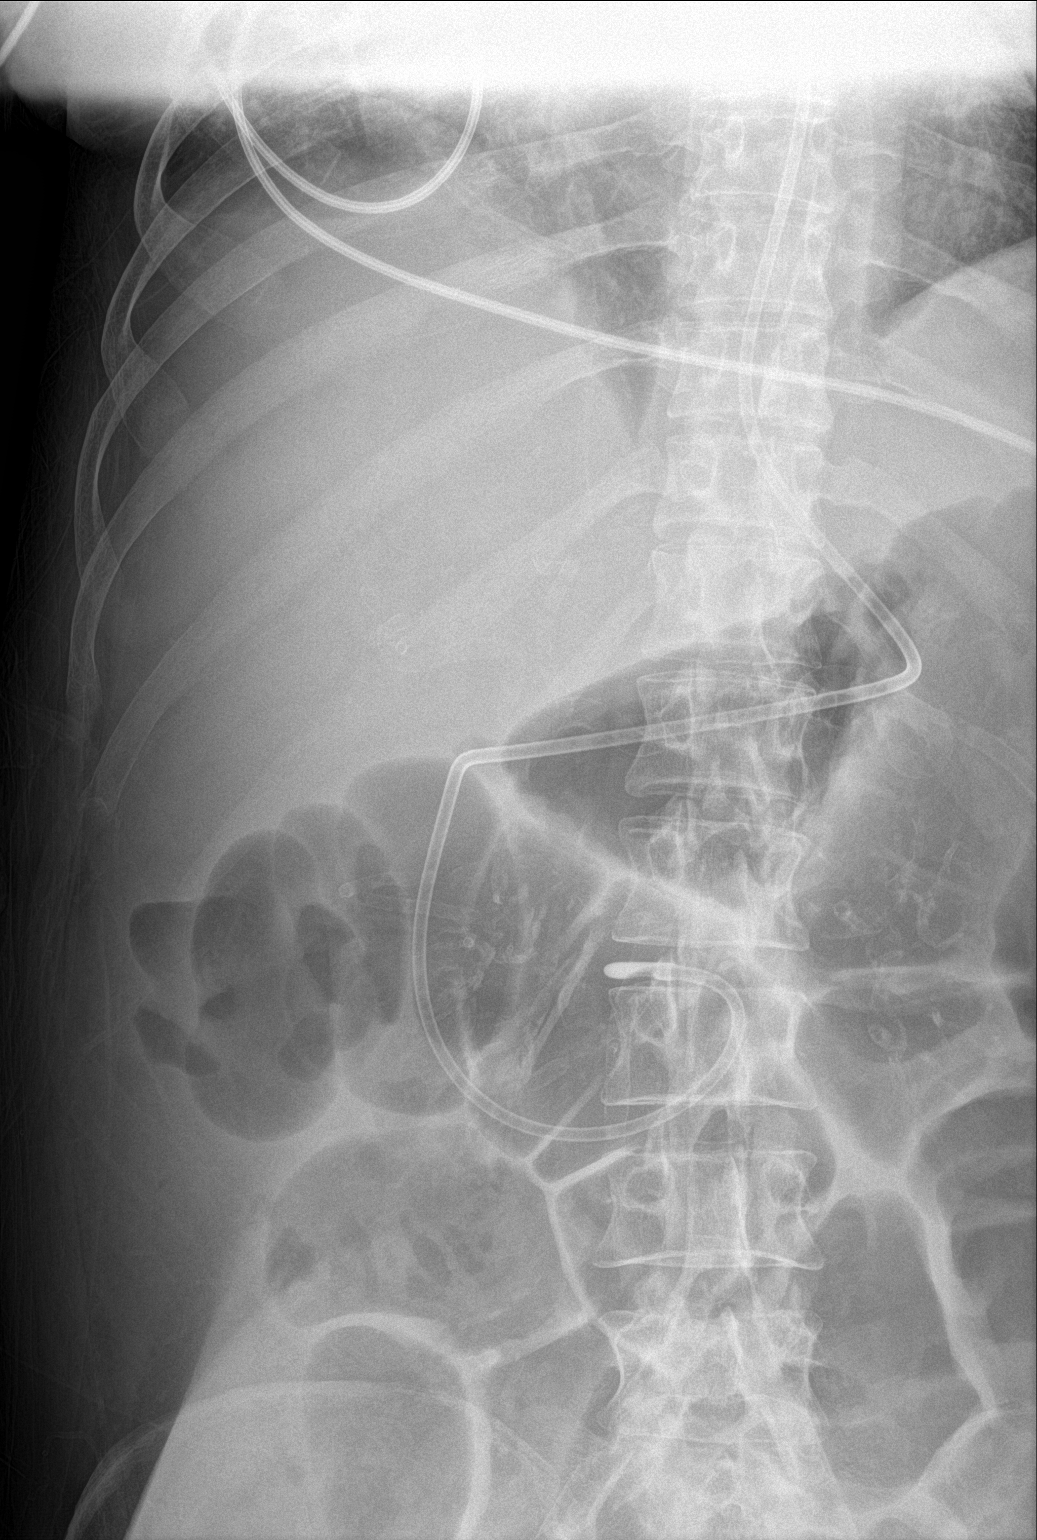
[im 2/2]
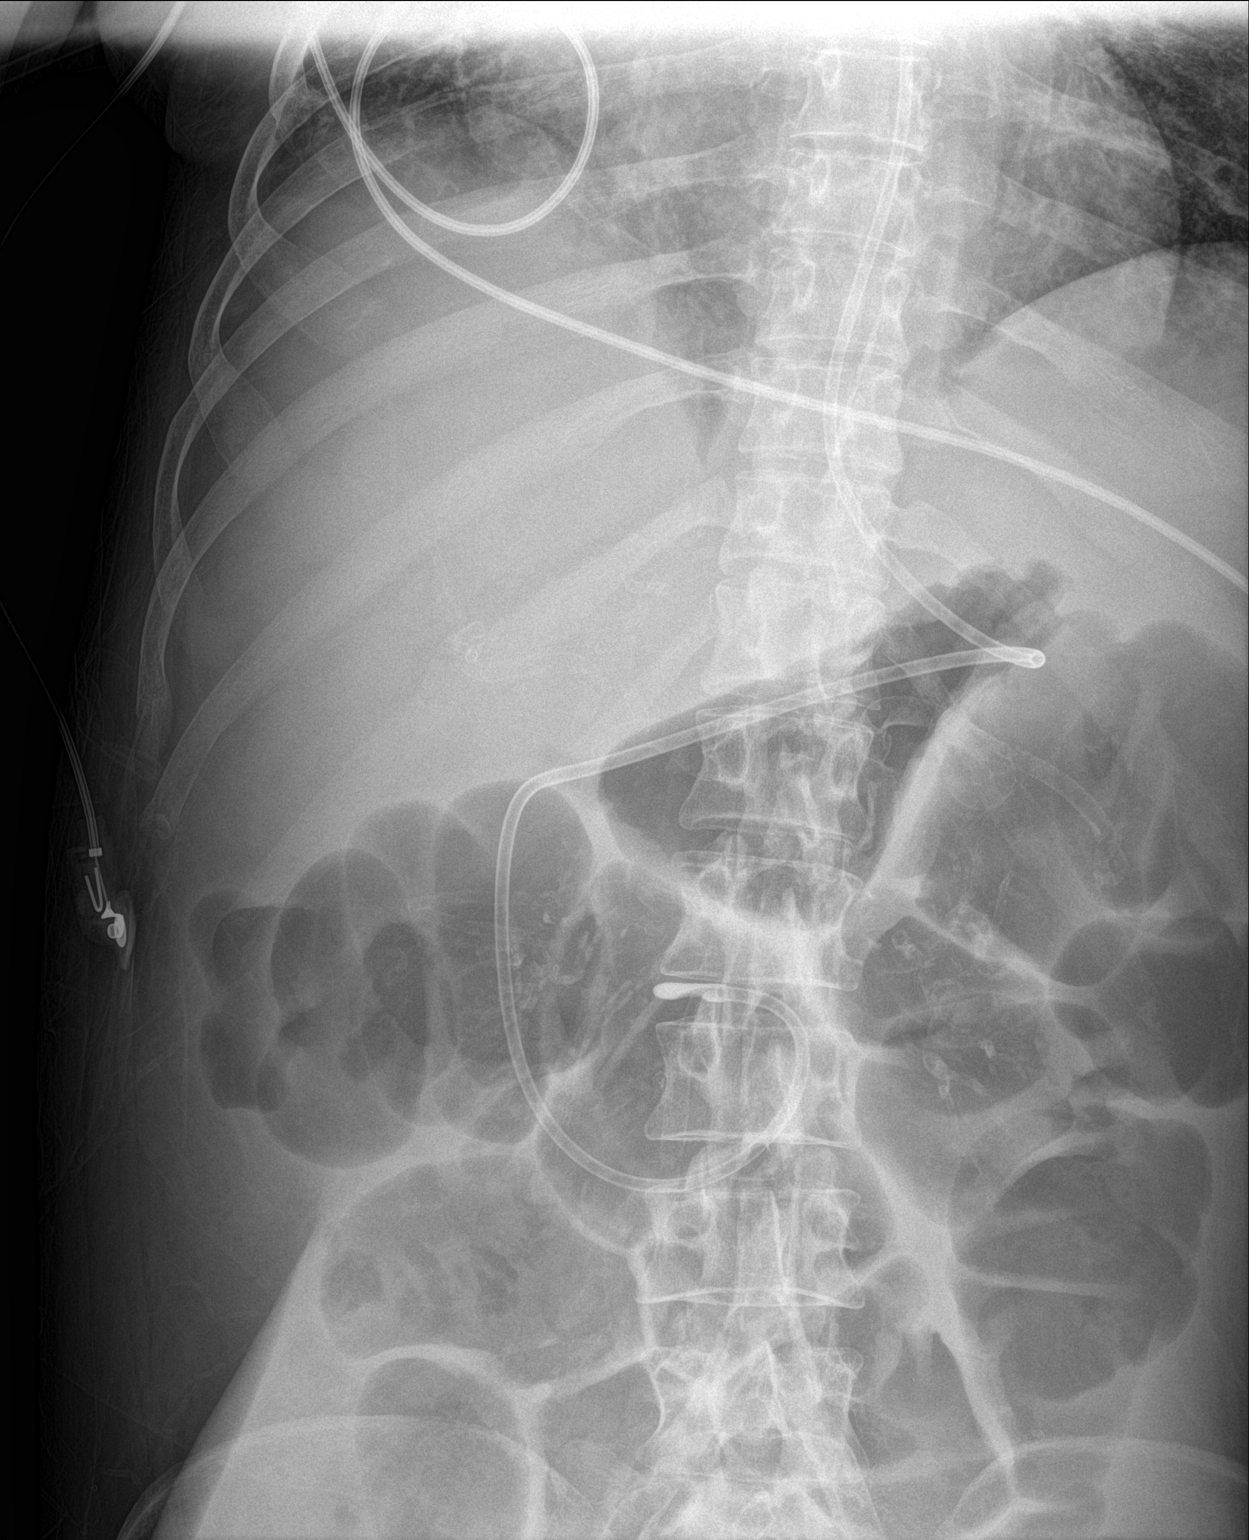

[2 of 2 positions shown; findings below may reference images not displayed]

FINDINGS: Feeding tube remains with the tip in the transverse duodenum. It
appears that the nasogastric tube has been removed. Stable
appearance of ileus involving primarily the colon. No visualized
free air or pneumatosis.
IMPRESSION: Feeding tube in transverse duodenum.  Stable ileus of colon.

## 2018-08-07 IMAGING — DX DG ABD PORTABLE 1V
1 series · 1 of 1 positions shown · non-contrast
Comparison: Radiographs May 01, 2017.

CLINICAL DATA: Abdominal pain.

EXAM:
PORTABLE ABDOMEN - 1 VIEW

[abdomen]
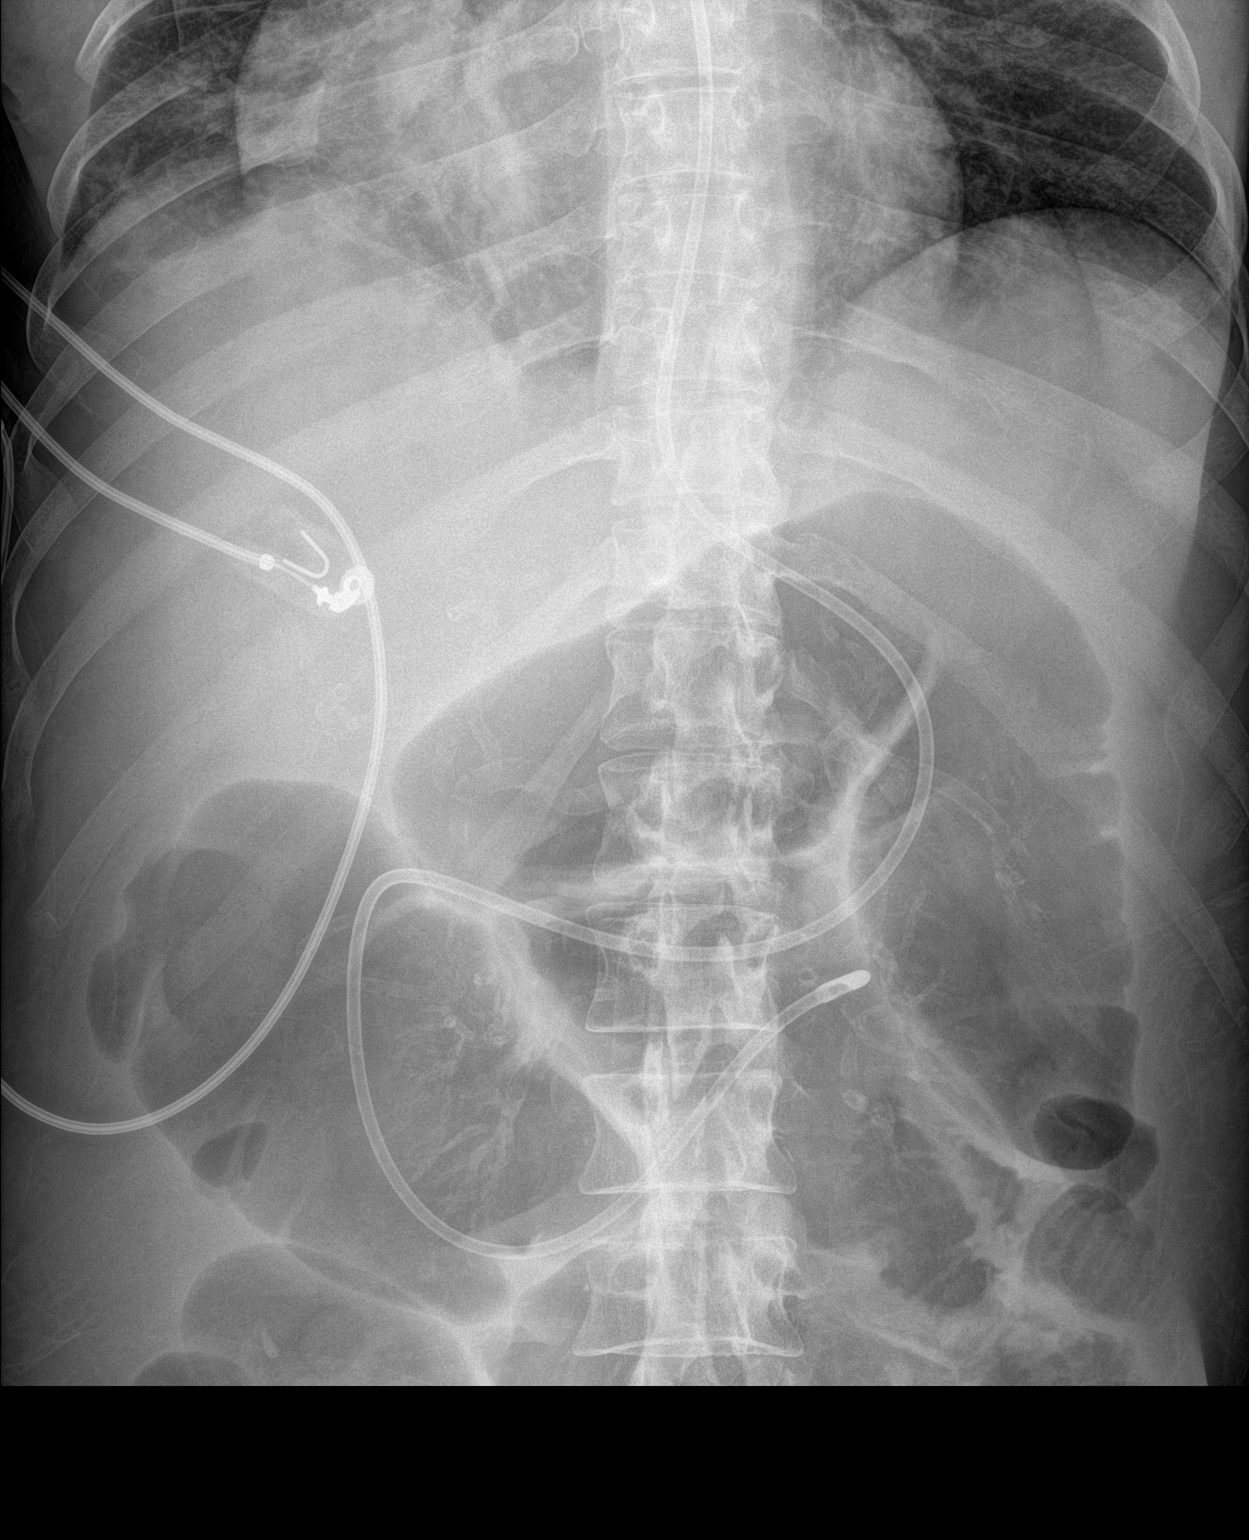

[1 of 1 positions shown; findings below may reference images not displayed]

FINDINGS: Stable colonic dilatation is noted. Minimally dilated air-filled
small bowel loops are noted on the left. Distal tip of feeding tube
is seen in the expected position of the fourth portion of the
duodenum or proximal jejunum.
IMPRESSION: Stable colonic dilatation. Distal tip of feeding tube is seen in
expected position of fourth portion of duodenum or proximal jejunum.

## 2018-10-06 IMAGING — US IR PARACENTESIS
1 series · 3 of 3 positions shown · non-contrast
Comparison: none

INDICATION: Patient with a history of cirrhosis and recurrent abdominal ascites.
Request is made for diagnostic and therapeutic paracentesis.

[Series 1: ir (id) (id)/(id)/(id) ir · 3 of 3 slices shown]
[im 1/3]
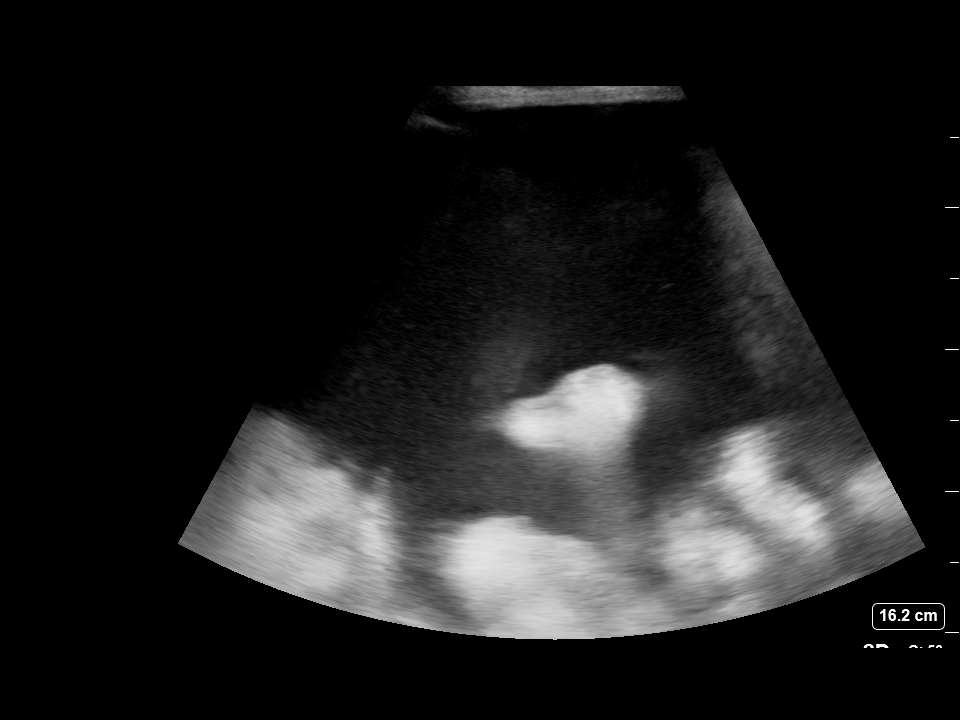
[im 2/3]
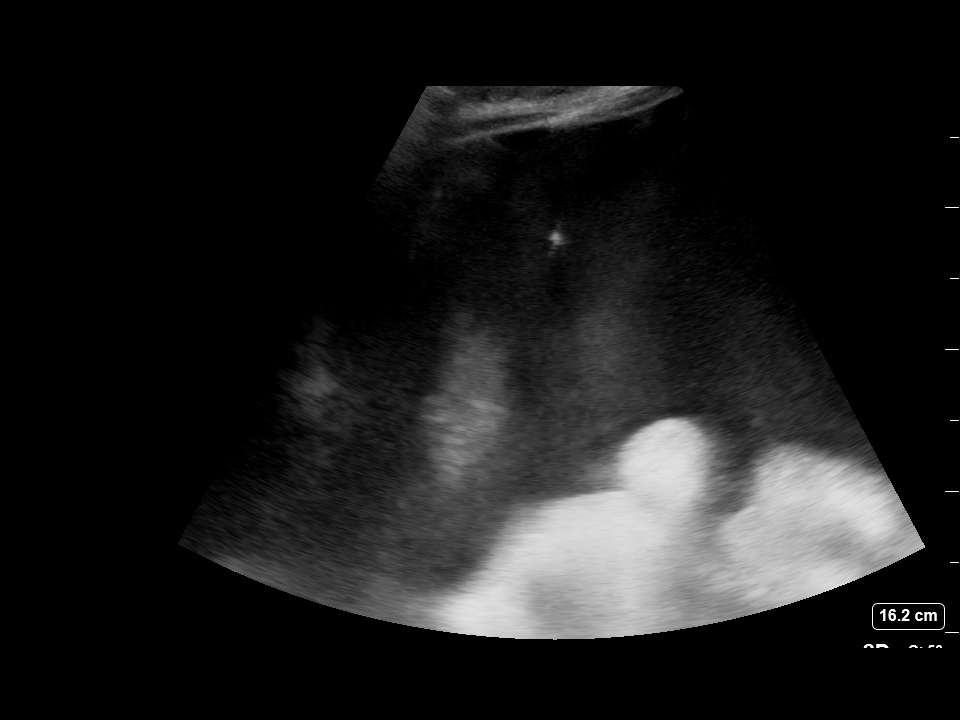
[im 3/3]
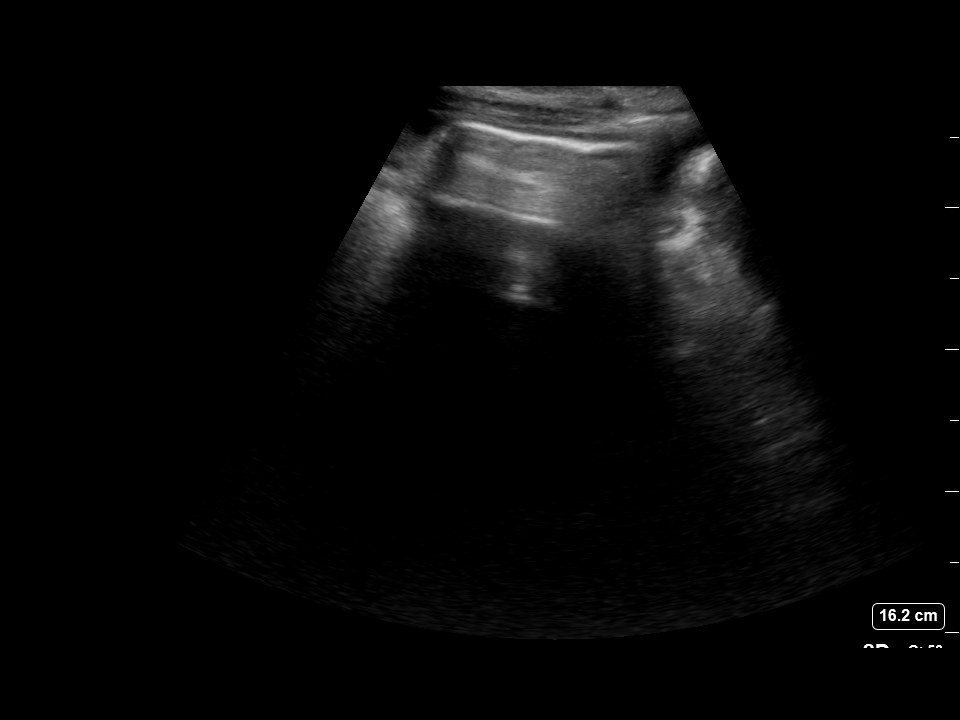

[3 of 3 positions shown; findings below may reference images not displayed]

EXAM:
ULTRASOUND GUIDED DIAGNOSTIC AND THERAPEUTIC PARACENTESIS

MEDICATIONS:
2% lidocaine

COMPLICATIONS:
None immediate.

PROCEDURE:
Informed written consent was obtained from the patient after a
discussion of the risks, benefits and alternatives to treatment. A
timeout was performed prior to the initiation of the procedure.

Initial ultrasound scanning demonstrates a large amount of ascites
within the left lower abdominal quadrant. The left lower abdomen was
prepped and draped in the usual sterile fashion. 2% lidocaine was
used for local anesthesia.

Following this, a 19 gauge, 7-cm, Yueh catheter was introduced. An
ultrasound image was saved for documentation purposes. The
paracentesis was performed. The catheter was removed and a dressing
was applied. The patient tolerated the procedure well without
immediate post procedural complication.
FINDINGS: A total of approximately 7.9 L of serous fluid was removed. Samples
were sent to the laboratory as requested by the clinical team.
IMPRESSION: Successful ultrasound-guided paracentesis yielding 7.9 liters of
peritoneal fluid.
